# Patient Record
Sex: Female | Born: 1960 | Race: White | Hispanic: No | Smoking: Former smoker
Health system: Southern US, Community
[De-identification: ages and names within clinical notes are randomized; demographics above are authoritative.]

## PROBLEM LIST (undated history)

## (undated) DIAGNOSIS — E119 Type 2 diabetes mellitus without complications: Secondary | ICD-10-CM

## (undated) DIAGNOSIS — R1013 Epigastric pain: Secondary | ICD-10-CM

## (undated) DIAGNOSIS — F329 Major depressive disorder, single episode, unspecified: Secondary | ICD-10-CM

## (undated) DIAGNOSIS — J449 Chronic obstructive pulmonary disease, unspecified: Secondary | ICD-10-CM

## (undated) DIAGNOSIS — T7840XA Allergy, unspecified, initial encounter: Secondary | ICD-10-CM

## (undated) DIAGNOSIS — E538 Deficiency of other specified B group vitamins: Secondary | ICD-10-CM

## (undated) DIAGNOSIS — F319 Bipolar disorder, unspecified: Secondary | ICD-10-CM

## (undated) DIAGNOSIS — R51 Headache: Secondary | ICD-10-CM

## (undated) DIAGNOSIS — F419 Anxiety disorder, unspecified: Secondary | ICD-10-CM

## (undated) DIAGNOSIS — B49 Unspecified mycosis: Secondary | ICD-10-CM

## (undated) DIAGNOSIS — R079 Chest pain, unspecified: Secondary | ICD-10-CM

## (undated) DIAGNOSIS — R42 Dizziness and giddiness: Secondary | ICD-10-CM

## (undated) DIAGNOSIS — F32A Depression, unspecified: Secondary | ICD-10-CM

## (undated) DIAGNOSIS — G4730 Sleep apnea, unspecified: Secondary | ICD-10-CM

## (undated) DIAGNOSIS — H269 Unspecified cataract: Secondary | ICD-10-CM

## (undated) DIAGNOSIS — R519 Headache, unspecified: Secondary | ICD-10-CM

## (undated) DIAGNOSIS — Z87442 Personal history of urinary calculi: Secondary | ICD-10-CM

## (undated) DIAGNOSIS — M5412 Radiculopathy, cervical region: Secondary | ICD-10-CM

## (undated) DIAGNOSIS — E7800 Pure hypercholesterolemia, unspecified: Secondary | ICD-10-CM

## (undated) DIAGNOSIS — J45909 Unspecified asthma, uncomplicated: Secondary | ICD-10-CM

## (undated) DIAGNOSIS — J189 Pneumonia, unspecified organism: Secondary | ICD-10-CM

## (undated) DIAGNOSIS — R350 Frequency of micturition: Secondary | ICD-10-CM

## (undated) DIAGNOSIS — K219 Gastro-esophageal reflux disease without esophagitis: Secondary | ICD-10-CM

## (undated) DIAGNOSIS — IMO0001 Reserved for inherently not codable concepts without codable children: Secondary | ICD-10-CM

## (undated) DIAGNOSIS — G894 Chronic pain syndrome: Secondary | ICD-10-CM

## (undated) DIAGNOSIS — J439 Emphysema, unspecified: Secondary | ICD-10-CM

## (undated) DIAGNOSIS — G35 Multiple sclerosis: Secondary | ICD-10-CM

## (undated) DIAGNOSIS — M1990 Unspecified osteoarthritis, unspecified site: Secondary | ICD-10-CM

## (undated) DIAGNOSIS — N200 Calculus of kidney: Secondary | ICD-10-CM

## (undated) DIAGNOSIS — Q6589 Other specified congenital deformities of hip: Secondary | ICD-10-CM

## (undated) DIAGNOSIS — D509 Iron deficiency anemia, unspecified: Secondary | ICD-10-CM

## (undated) DIAGNOSIS — Z9889 Other specified postprocedural states: Secondary | ICD-10-CM

## (undated) DIAGNOSIS — L409 Psoriasis, unspecified: Secondary | ICD-10-CM

## (undated) DIAGNOSIS — M109 Gout, unspecified: Secondary | ICD-10-CM

## (undated) DIAGNOSIS — G35D Multiple sclerosis, unspecified: Secondary | ICD-10-CM

## (undated) DIAGNOSIS — M797 Fibromyalgia: Secondary | ICD-10-CM

## (undated) DIAGNOSIS — M5430 Sciatica, unspecified side: Secondary | ICD-10-CM

## (undated) DIAGNOSIS — R609 Edema, unspecified: Secondary | ICD-10-CM

## (undated) DIAGNOSIS — M5030 Other cervical disc degeneration, unspecified cervical region: Secondary | ICD-10-CM

## (undated) DIAGNOSIS — J302 Other seasonal allergic rhinitis: Secondary | ICD-10-CM

## (undated) DIAGNOSIS — R002 Palpitations: Secondary | ICD-10-CM

## (undated) DIAGNOSIS — Z8619 Personal history of other infectious and parasitic diseases: Secondary | ICD-10-CM

## (undated) DIAGNOSIS — R112 Nausea with vomiting, unspecified: Secondary | ICD-10-CM

## (undated) HISTORY — DX: Bipolar disorder, unspecified: F31.9

## (undated) HISTORY — DX: Deficiency of other specified B group vitamins: E53.8

## (undated) HISTORY — PX: LITHOTRIPSY: SUR834

## (undated) HISTORY — DX: Other seasonal allergic rhinitis: J30.2

## (undated) HISTORY — DX: Radiculopathy, cervical region: M54.12

## (undated) HISTORY — DX: Major depressive disorder, single episode, unspecified: F32.9

## (undated) HISTORY — DX: Multiple sclerosis, unspecified: G35.D

## (undated) HISTORY — DX: Anxiety disorder, unspecified: F41.9

## (undated) HISTORY — PX: OTHER SURGICAL HISTORY: SHX169

## (undated) HISTORY — DX: Unspecified cataract: H26.9

## (undated) HISTORY — DX: Depression, unspecified: F32.A

## (undated) HISTORY — DX: Emphysema, unspecified: J43.9

## (undated) HISTORY — DX: Multiple sclerosis: G35

## (undated) HISTORY — DX: Chest pain, unspecified: R07.9

## (undated) HISTORY — DX: Gastro-esophageal reflux disease without esophagitis: K21.9

## (undated) HISTORY — DX: Frequency of micturition: R35.0

## (undated) HISTORY — DX: Chronic pain syndrome: G89.4

## (undated) HISTORY — DX: Iron deficiency anemia, unspecified: D50.9

## (undated) HISTORY — DX: Unspecified asthma, uncomplicated: J45.909

## (undated) HISTORY — DX: Unspecified mycosis: B49

## (undated) HISTORY — DX: Calculus of kidney: N20.0

## (undated) HISTORY — DX: Personal history of other infectious and parasitic diseases: Z86.19

## (undated) HISTORY — DX: Epigastric pain: R10.13

## (undated) HISTORY — DX: Gout, unspecified: M10.9

## (undated) HISTORY — DX: Psoriasis, unspecified: L40.9

## (undated) HISTORY — DX: Other specified congenital deformities of hip: Q65.89

## (undated) HISTORY — DX: Fibromyalgia: M79.7

## (undated) HISTORY — DX: Allergy, unspecified, initial encounter: T78.40XA

## (undated) HISTORY — DX: Sciatica, unspecified side: M54.30

## (undated) SURGERY — Surgical Case
Anesthesia: *Unknown

---
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
## 2008-10-09 ENCOUNTER — Encounter: Payer: Self-pay | Admitting: Family Medicine

## 2009-04-25 ENCOUNTER — Encounter: Payer: Self-pay | Admitting: Family Medicine

## 2009-04-25 LAB — CONVERTED CEMR LAB: Pap Smear: NORMAL

## 2009-08-06 ENCOUNTER — Encounter: Payer: Self-pay | Admitting: Family Medicine

## 2010-04-29 ENCOUNTER — Encounter: Payer: Self-pay | Admitting: Family Medicine

## 2010-08-28 ENCOUNTER — Ambulatory Visit: Payer: Self-pay | Admitting: Family Medicine

## 2010-08-28 DIAGNOSIS — Z87442 Personal history of urinary calculi: Secondary | ICD-10-CM | POA: Insufficient documentation

## 2010-08-28 DIAGNOSIS — G35 Multiple sclerosis: Secondary | ICD-10-CM | POA: Insufficient documentation

## 2010-08-28 DIAGNOSIS — K219 Gastro-esophageal reflux disease without esophagitis: Secondary | ICD-10-CM | POA: Insufficient documentation

## 2010-08-28 DIAGNOSIS — F3189 Other bipolar disorder: Secondary | ICD-10-CM | POA: Insufficient documentation

## 2010-08-28 DIAGNOSIS — F329 Major depressive disorder, single episode, unspecified: Secondary | ICD-10-CM | POA: Insufficient documentation

## 2010-08-28 DIAGNOSIS — M1990 Unspecified osteoarthritis, unspecified site: Secondary | ICD-10-CM | POA: Insufficient documentation

## 2010-08-28 DIAGNOSIS — F3289 Other specified depressive episodes: Secondary | ICD-10-CM | POA: Insufficient documentation

## 2010-08-28 DIAGNOSIS — M169 Osteoarthritis of hip, unspecified: Secondary | ICD-10-CM | POA: Insufficient documentation

## 2010-09-04 ENCOUNTER — Telehealth: Payer: Self-pay | Admitting: Family Medicine

## 2010-09-04 ENCOUNTER — Encounter: Payer: Self-pay | Admitting: Family Medicine

## 2010-09-05 ENCOUNTER — Encounter: Payer: Self-pay | Admitting: Family Medicine

## 2010-09-08 ENCOUNTER — Encounter: Payer: Self-pay | Admitting: Family Medicine

## 2010-09-08 ENCOUNTER — Ambulatory Visit: Payer: Self-pay | Admitting: Internal Medicine

## 2010-09-08 LAB — CONVERTED CEMR LAB
Casts: 0 /lpf
Glucose, Urine, Semiquant: NEGATIVE
Ketones, urine, test strip: NEGATIVE
Specific Gravity, Urine: 1.015
pH: 7.5

## 2010-09-09 ENCOUNTER — Encounter: Payer: Self-pay | Admitting: Family Medicine

## 2010-09-09 ENCOUNTER — Telehealth: Payer: Self-pay | Admitting: Family Medicine

## 2010-09-16 ENCOUNTER — Encounter: Payer: Self-pay | Admitting: Family Medicine

## 2010-09-19 ENCOUNTER — Telehealth: Payer: Self-pay | Admitting: Family Medicine

## 2010-10-02 ENCOUNTER — Telehealth: Payer: Self-pay | Admitting: Family Medicine

## 2010-10-13 ENCOUNTER — Telehealth: Payer: Self-pay | Admitting: Family Medicine

## 2010-10-23 ENCOUNTER — Ambulatory Visit: Payer: Self-pay | Admitting: Family Medicine

## 2010-10-23 DIAGNOSIS — R21 Rash and other nonspecific skin eruption: Secondary | ICD-10-CM | POA: Insufficient documentation

## 2010-10-23 DIAGNOSIS — R55 Syncope and collapse: Secondary | ICD-10-CM | POA: Insufficient documentation

## 2010-10-24 ENCOUNTER — Telehealth: Payer: Self-pay | Admitting: Family Medicine

## 2010-10-28 ENCOUNTER — Telehealth: Payer: Self-pay | Admitting: Family Medicine

## 2010-11-05 ENCOUNTER — Telehealth: Payer: Self-pay | Admitting: Family Medicine

## 2010-11-12 ENCOUNTER — Encounter: Payer: Self-pay | Admitting: Family Medicine

## 2010-11-13 ENCOUNTER — Encounter: Payer: Self-pay | Admitting: Family Medicine

## 2010-11-13 ENCOUNTER — Ambulatory Visit
Admission: RE | Admit: 2010-11-13 | Discharge: 2010-11-13 | Payer: Self-pay | Source: Home / Self Care | Attending: Family Medicine | Admitting: Family Medicine

## 2010-11-13 DIAGNOSIS — N200 Calculus of kidney: Secondary | ICD-10-CM | POA: Insufficient documentation

## 2010-11-13 LAB — CONVERTED CEMR LAB
Bilirubin Urine: NEGATIVE
Glucose, Urine, Semiquant: NEGATIVE
Ketones, urine, test strip: NEGATIVE
Urobilinogen, UA: 0.2
pH: 6

## 2010-11-16 DIAGNOSIS — G4730 Sleep apnea, unspecified: Secondary | ICD-10-CM

## 2010-11-16 HISTORY — DX: Sleep apnea, unspecified: G47.30

## 2010-11-26 ENCOUNTER — Telehealth: Payer: Self-pay | Admitting: Family Medicine

## 2010-11-27 ENCOUNTER — Ambulatory Visit: Admit: 2010-11-27 | Payer: Self-pay | Admitting: Family Medicine

## 2010-11-27 ENCOUNTER — Ambulatory Visit
Admission: RE | Admit: 2010-11-27 | Discharge: 2010-11-27 | Payer: Self-pay | Source: Home / Self Care | Attending: Family Medicine | Admitting: Family Medicine

## 2010-12-02 ENCOUNTER — Telehealth: Payer: Self-pay | Admitting: Family Medicine

## 2010-12-04 ENCOUNTER — Ambulatory Visit
Admission: RE | Admit: 2010-12-04 | Discharge: 2010-12-04 | Payer: Self-pay | Source: Home / Self Care | Attending: Family Medicine | Admitting: Family Medicine

## 2010-12-09 ENCOUNTER — Telehealth: Payer: Self-pay | Admitting: Family Medicine

## 2010-12-11 ENCOUNTER — Encounter: Payer: Self-pay | Admitting: Family Medicine

## 2010-12-15 ENCOUNTER — Ambulatory Visit
Admission: RE | Admit: 2010-12-15 | Discharge: 2010-12-15 | Payer: Self-pay | Source: Home / Self Care | Attending: Family Medicine | Admitting: Family Medicine

## 2010-12-15 ENCOUNTER — Telehealth: Payer: Self-pay | Admitting: Family Medicine

## 2010-12-15 DIAGNOSIS — B029 Zoster without complications: Secondary | ICD-10-CM | POA: Insufficient documentation

## 2010-12-16 ENCOUNTER — Telehealth: Payer: Self-pay | Admitting: Family Medicine

## 2010-12-16 NOTE — Progress Notes (Signed)
Summary: Rx Hydrocodone/APAP  Phone Note Refill Request Call back at [phone] Message from:  Patient on October 13, 2010 11:23 AM  Refills Requested: Medication #1:  HYDROCODONE-ACETAMINOPHEN 10-325 MG TABS take one and a half tablets by mouth four times daily   Last Refilled: 09/19/2010 Patient request Rx refill.  Please advise.   Method Requested: Telephone to Pharmacy Initial call taken by: Linde Gillis CMA Duncan Dull),  October 13, 2010 11:24 AM  Follow-up for Phone Call        Rx called to pharmacy, left message on cell phone voicemail advising patient as instructed. Follow-up by: Linde Gillis CMA Duncan Dull),  October 13, 2010 12:02 PM    Prescriptions: HYDROCODONE-ACETAMINOPHEN 10-325 MG TABS (HYDROCODONE-ACETAMINOPHEN) take one and a half tablets by mouth four times daily  #360 x 0   Entered and Authorized by:   Ruthe Mannan MD   Signed by:   Ruthe Mannan MD on 10/13/2010   Method used:   Telephoned to ...       [facility]* (retail)       [address]       [place], [place]  [address]       Ph: [phone]       Fax: [phone]   RxID:   [id number]   Appended Document: Rx Hydrocodone/APAP Spoke with Baird Lyons at [facility] and was advised that patient still has 180 tablets left on the 09/19/2010 refill.  They will just keep this Rx on file for future refills.

## 2010-12-16 NOTE — Progress Notes (Signed)
Summary: please verify directions  Phone Note From Pharmacy   Caller: [facility] Summary of Call: Form from [facility] is on your desk.  They are asking for verification on directions. Initial call taken by: Lowella Petties CMA, AAMA,  October 24, 2010 3:13 PM  Follow-up for Phone Call        in my box. Ruthe Mannan MD  October 24, 2010 3:15 PM  Form faxed back to [facility] at [phone].  Follow-up by: Linde Gillis CMA Duncan Dull),  October 24, 2010 3:19 PM

## 2010-12-16 NOTE — Medication Information (Signed)
Summary: Approval for Lidoderm/[facility]  Approval for Lidoderm/[facility]   Imported By: Lanelle Bal 09/15/2010 08:23:32  _____________________________________________________________________  External Attachment:    Type:   Image     Comment:   External Document

## 2010-12-16 NOTE — Progress Notes (Signed)
Summary: Rx Hydrocodone/APAP  Phone Note Refill Request Message from:  Patient on September 19, 2010 2:43 PM  Refills Requested: Medication #1:  HYDROCODONE-ACETAMINOPHEN 10-325 MG TABS take one and a half tablets by mouth four times daily   Last Refilled: 08/28/2010 Patient is requesting a refill, please advise.   Method Requested: Telephone to Pharmacy Initial call taken by: Linde Gillis CMA Duncan Dull),  September 19, 2010 2:44 PM    Prescriptions: HYDROCODONE-ACETAMINOPHEN 10-325 MG TABS (HYDROCODONE-ACETAMINOPHEN) take one and a half tablets by mouth four times daily  #360 x 0   Entered and Authorized by:   Ruthe Mannan MD   Signed by:   Ruthe Mannan MD on 09/19/2010   Method used:   Print then Give to Patient   RxID:   [id number]   Appended Document: Rx Hydrocodone/APAP Rx called to [facility], patient notified.

## 2010-12-16 NOTE — Assessment & Plan Note (Signed)
Summary: FAINTED AT WORK LAST WEEK/NT   Vital Signs:  Patient profile:   50 year old female Height:      69.75 inches Weight:      210.50 pounds BMI:     30.53 Temp:     99.2 degrees F oral Pulse rate:   76 / minute Pulse rhythm:   regular BP sitting:   140 / 80  (left arm) Cuff size:   regular  Vitals Entered By: Linde Gillis CMA Duncan Dull) (October 23, 2010 3:44 PM) CC: fainted at work a week ago   History of Present Illness: 50 yo with complicated medical history, including bipolar disorder, progressive MS here for passing out at work.    Is given a limited time for restroom breaks.  Was rushing to get back to her desk, felt light headed and passed out.  Did not hit her head.  Per witnesses, woke up immediately.  They called 911, EMS checked CBG and BP. CBG was 77 and BP was 117/70.  No CP, SOB or palpitations.  NO recurrent episodes.  Still has rash on foot and hip.  Topical steroids did not help.  Lesion on foot is larger.  Itches at times.  Current Medications (verified): 1)  Cymbalta 60 Mg Cpep (Duloxetine Hcl) .... Take One Tablet By Mouth Two Times A Day 2)  Lamotrigine 150 Mg Tabs (Lamotrigine) .... Take Two Tablets By Mouth Daily 3)  Hydrocodone-Acetaminophen 10-325 Mg Tabs (Hydrocodone-Acetaminophen) .... Take One and A Half Tablets By Mouth Four Times Daily 4)  Hydrochlorothiazide 25 Mg Tabs (Hydrochlorothiazide) .... Take One Tablet By Mouth Daily 5)  Temazepam 30 Mg Caps (Temazepam) .... Take One Tablet By Mouth Two Times A Day 6)  Hydroxyzine Hcl 25 Mg Tabs (Hydroxyzine Hcl) .Marland Kitchen.. 1 Tab By Mouth Daily. 7)  Lidoderm 5 % Ptch (Lidocaine) .... Apply Two Patches On Skin Daily. 8)  Voltaren 1 %  Gel (Diclofenac Sodium) .... Two Times A Day  To Qid As Needed 9)  Fluocinolone Acetonide 0.01 % Crea (Fluocinolone Acetonide) .... Apply To Area Two Times A Day As Needed 10)  Xopenex Hfa 45 Mcg/act Aero (Levalbuterol Tartrate) 11)  Ciprofloxacin Hcl 500 Mg Tabs  (Ciprofloxacin Hcl) .... Take One By Mouth Two Times A Day X 7 Days  Allergies: 1)  ! Ceclor 2)  ! Voltaren 3)  ! Motrin 4)  ! Advil 5)  ! * Aleve 6)  ! * Abilify  Past History:  Past Medical History: Last updated: 09/08/2010 bipolar disorder multiple sclerosis Depression Osteoarthritis GERD Nephrolithiasis, hx of  Past Surgical History: Last updated: 09-Sep-2010 Tonsillectomy  Family History: Last updated: 2010/09/09 Dad died at 40 of sarcoma Mom- healthy  Social History: Last updated: Sep 09, 2010 recently moved here from [place] to help take care of her mother. lives with mother and sister. has 49 yo son in [place]. Current Smoker Alcohol use-no Drug use-no  Risk Factors: Smoking Status: current (09/09/10)  Review of Systems      See HPI CV:  Complains of fainting and lightheadness. MS:  Denies muscle weakness. Derm:  Denies rash. Neuro:  Denies headaches and numbness.  Physical Exam  General:  alert, very talkative, seems to have diffiiculty focusing on one topic. Mouth:  good dentition.   Neck:  No deformities, masses, or tenderness noted. no carotid bruits Lungs:  Normal respiratory effort, chest expands symmetrically. Lungs are clear to auscultation, no crackles or wheezes. Heart:  Normal rate and regular rhythm. S1 and S2 normal  without gallop, murmur, click, rub or other extra sounds. Abdomen:  Bowel sounds positive,abdomen soft and non-tender without masses, organomegaly or hernias noted. Extremities:  no edema Skin:  large circular raised erythematous plaque on right foot and left hip.   Impression & Recommendations:  Problem # 1:  SYNCOPE (ICD-780.2) Assessment New Resolved. Does not want a work up, likely vasovagal due to multiple medications, stressors and dehydration.    Problem # 2:  SKIN RASH (ICD-782.1) Assessment: New  Refer to derm.  ? psorasis, discoid autoimmune. Her updated medication list for this problem includes:    Fluocinolone  Acetonide 0.01 % Crea (Fluocinolone acetonide) .Marland Kitchen... Apply to area two times a day as needed  Orders: Dermatology Referral (Derma)  Her updated medication list for this problem includes:    Fluocinolone Acetonide 0.01 % Crea (Fluocinolone acetonide) .Marland Kitchen... Apply to area two times a day as needed  Complete Medication List: 1)  Cymbalta 60 Mg Cpep (Duloxetine hcl) .... Take one tablet by mouth two times a day 2)  Lamotrigine 150 Mg Tabs (Lamotrigine) .... Take two tablets by mouth daily 3)  Hydrocodone-acetaminophen 10-325 Mg Tabs (Hydrocodone-acetaminophen) .... Take one and a half tablets by mouth four times daily 4)  Hydrochlorothiazide 25 Mg Tabs (Hydrochlorothiazide) .... Take one tablet by mouth daily 5)  Temazepam 30 Mg Caps (Temazepam) .... Take one tablet by mouth two times a day 6)  Hydroxyzine Hcl 25 Mg Tabs (Hydroxyzine hcl) .Marland Kitchen.. 1 tab by mouth daily. 7)  Lidoderm 5 % Ptch (Lidocaine) .... Apply two patches on skin daily. 8)  Voltaren 1 % Gel (Diclofenac sodium) .... Two times a day  to qid as needed 9)  Fluocinolone Acetonide 0.01 % Crea (Fluocinolone acetonide) .... Apply to area two times a day as needed 10)  Xopenex Hfa 45 Mcg/act Aero (Levalbuterol tartrate) 11)  Ciprofloxacin Hcl 500 Mg Tabs (Ciprofloxacin hcl) .... Take one by mouth two times a day x 7 days  Patient Instructions: 1)  Please stop by to see Shirlee Limerick on your way out.   Orders Added: 1)  Est. Patient Level IV [04540] 2)  Dermatology Referral [Derma]    Current Allergies (reviewed today): ! CECLOR ! VOLTAREN ! MOTRIN ! ADVIL ! * ALEVE ! * ABILIFY

## 2010-12-16 NOTE — Progress Notes (Signed)
Summary: refill request for lidoderm  Phone Note Refill Request Message from:  Fax from Pharmacy  Refills Requested: Medication #1:  LIDODERM 5 % PTCH apply two patches on skin daily.   Last Refilled: 09/05/2010 Faxed request from [facility], [phone].   Initial call taken by: Lowella Petties CMA, AAMA,  October 02, 2010 11:33 AM  Follow-up for Phone Call        Rx called to [facility]. Follow-up by: Linde Gillis CMA Duncan Dull),  October 02, 2010 11:58 AM    Prescriptions: LIDODERM 5 % PTCH (LIDOCAINE) apply two patches on skin daily.  #3 boxes x 0   Entered and Authorized by:   Ruthe Mannan MD   Signed by:   Ruthe Mannan MD on 10/02/2010   Method used:   Telephoned to ...       [facility] (mail-order)             , [place]         Ph: [phone]       Fax:    RxID:   [id number]

## 2010-12-16 NOTE — Letter (Signed)
Summary: Out of Work  [employer] at [facility]  [address] [place], [place] [address]   Phone: [phone]  Fax: [phone]    September 09, 2010   Employee:  Nigel Berthold    To Whom It May Concern:   For Medical reasons, Ms. Whittlesey needs Voltaren Gel for pain control.  She has tried multiple NSAIDs, Ibuprofen, Tylenol, Meloxicam along with several narcotics, most recently Norco and Vicodin.    We should be encouraging our patients to use medications that do not cause sedation and addiction.   If you need additional information, please feel free to contact our office.         Sincerely,    Ruthe Mannan MD

## 2010-12-16 NOTE — Letter (Signed)
Summary: Controlled Substances Contract  [place] at [facility]  [address] [place], [place] [address]   Phone: [phone]  Fax: [phone]    [facility] Controlled Substances Contract         Patient Name: Emily Moon Patient DOB: 1961/07/26        Patient MRN:  [id number]        Physician's Name: _________________________________________   Patients must complete this contract before doctors at the [facility] office will be willing to prescribe controlled substances. I understand that: ___1)  I am responsible for my controlled substance medications.  If my prescription is lost, misplaced or stolen, or if I take more than prescribed, my doctor will not write me a new prescription. ___2)  I will not request or accept controlled substances or controlled substance prescriptions from any other doctor or clinic while I am receiving controlled substance treatment at [facility].  The ONLY exception is if controlled substances are prescribed for the treatment of an acute condition that is NOT the diagnosis for which I am receiving treatment at [facility].   I will call my physician at [facility] if I receive controlled substance or controlled substance prescriptions from anywhere else. ___3)  Controlled substance refills will be made ONLY during regular office hours. ___4)  Refills will not be made if I run out early.  Refills will not be made during work-in or urgent care visits.  Refills will NOT be made for "emergencies", such as on a Friday afternoon or by on call service at night or weekends.  I understand that I am required to call at least 2 business days prior to expiration date for controlled substance and/or needing controlled substance refills.   ___5)  I will not use illicit (illegal) drugs.  ___6)  I agree to take urine or blood drug tests when requested for routine screening. ___7)  I agree to use only ONE pharmacy for  filling ALL my controlled substance prescriptions.             Name and Location of Pharmacy:                                                                                                                  ___8)  I understand that my doctor may review my use of controlled substances using the [facility] Controlled Substance Reporting System. ___9)  If I behave in an abusive way towards [facility] staff, my controlled substance prescriptions may be stopped, and I may be dismissed from this practice. ___10)  I understand that if I break any of the above terms of this contract, my pain prescription and/or treatment may be stopped immediately.  If I get controlled substances from someone else or use illegal drugs, I may be reported to all my doctors, medical facilities and appropriate authorities.  I have been fully informed by [facility] physicians and the staff regarding psychological dependence (addiction)  to controlled substances.  I understand that I should stop my medication ONLY under medical supervision or I may have withdrawal symptoms.  ***I have read this contract and it has been explained to me by [facility] physicians and/or their staff, and I fully understand the consequences of violating any of the terms of this contract.    Patient Signature _________________________________________ Date August 28, 2010   [facility] Staff Signature ____________________________________ Date August 28, 2010

## 2010-12-16 NOTE — Assessment & Plan Note (Signed)
Summary: ? UTI   Vital Signs:  Patient profile:   50 year old female Weight:      213.25 pounds Temp:     99.5 degrees F oral Pulse rate:   74 / minute Pulse rhythm:   regular BP sitting:   124 / 74  (left arm) Cuff size:   large  Vitals Entered By: Selena Batten Dance CMA Duncan Dull) (September 08, 2010 3:47 PM)   History of Present Illness: CC: ?UTI  This am with polyuria and pressure.  Starting to have pressure as well.  + urgency.  low grade fever (in clinic to 99.5).  + itching.  No vag discharge.  on period.  takes cranberry pill every night.  tries not to drink too much soda to prevent kidney stones.  h/o MS, h/o kidney stones 2008 s/p hospitalization and stent with septic shock.  No flank pain.  Mild fever.  No abd pain other than pressure.  No n/v.  no recent abx use.  says has had stress incontinence in past (with sneezing/laughing), has had 1 child, vag.  previuosly on anticholinergic but unable to tolerate side effects.  Hasn't had bladder drop in past.  Current Medications (verified): 1)  Cymbalta 60 Mg Cpep (Duloxetine Hcl) .... Take One Tablet By Mouth Two Times A Day 2)  Lamotrigine 150 Mg Tabs (Lamotrigine) .... Take Two Tablets By Mouth Daily 3)  Hydrocodone-Acetaminophen 10-325 Mg Tabs (Hydrocodone-Acetaminophen) .... Take One and A Half Tablets By Mouth Four Times Daily 4)  Hydrochlorothiazide 25 Mg Tabs (Hydrochlorothiazide) .... Take One Tablet By Mouth Daily 5)  Temazepam 30 Mg Caps (Temazepam) .... Take One Tablet By Mouth Two Times A Day 6)  Hydroxyzine Hcl 25 Mg Tabs (Hydroxyzine Hcl) .Marland Kitchen.. 1 Tab By Mouth Daily. 7)  Lidoderm 5 % Ptch (Lidocaine) .... Apply Two Patches On Skin Daily. 8)  Voltaren 1 %  Gel (Diclofenac Sodium) .... Two Times A Day  To Qid As Needed 9)  Fluocinolone Acetonide 0.01 % Crea (Fluocinolone Acetonide) .... Apply To Area Two Times A Day As Needed 10)  Xopenex Hfa 45 Mcg/act Aero (Levalbuterol Tartrate)  Allergies: 1)  ! Ceclor 2)  ! Voltaren 3)   ! Motrin 4)  ! Advil 5)  ! * Aleve 6)  ! * Abilify  Past History:  Social History: Last updated: 08/28/2010 recently moved here from [place] to help take care of her mother. lives with mother and sister. has 22 yo son in [place]. Current Smoker Alcohol use-no Drug use-no  Past Medical History: bipolar disorder multiple sclerosis Depression Osteoarthritis GERD Nephrolithiasis, hx of  Review of Systems       per HPI  Physical Exam  General:  alert, very talkative, seems to have diffiiculty focusing on one topic. Lungs:  Normal respiratory effort, chest expands symmetrically. Lungs are clear to auscultation, no crackles or wheezes. Heart:  Normal rate and regular rhythm. S1 and S2 normal without gallop, murmur, click, rub or other extra sounds. Abdomen:  Bowel sounds positive,abdomen soft and non-tender without masses, organomegaly or hernias noted.  no CVA tenderness.  slight suprapubic pressure Pulses:  2+ rad pulses Extremities:  no edema   Impression & Recommendations:  Problem # 1:  ? of UTI (ICD-599.0)  complicated urological history (kidney stones, lithotripsy, stents, etc).  could be UTI (given sxs) however UA seemed contaminated with epis today and not many whites.  recollected specimen, sent for culture.  started cipro bid.  per patient bactrim doesn't work, wanted full  7 day course of cipro.  advised I would start it but if UCx with no growth, we will call and stop abx.  pt agrees with plan.  provided with note for work for being seen today.  advised if no infection, will need to return to eval as to why having those sxs, likely GYN exam and assess for bladder prolapse/pelvic wall relaxation.  h/o unspecified incontinence per patient.  push fluids for now.  Her updated medication list for this problem includes:    Ciprofloxacin Hcl 500 Mg Tabs (Ciprofloxacin hcl) .Marland Kitchen... Take one by mouth two times a day x 7 days  Orders: UA Dipstick W/ Micro (manual) ([address]) Specimen  Handling ([address]) T-Culture, Urine ([id number])  Complete Medication List: 1)  Cymbalta 60 Mg Cpep (Duloxetine hcl) .... Take one tablet by mouth two times a day 2)  Lamotrigine 150 Mg Tabs (Lamotrigine) .... Take two tablets by mouth daily 3)  Hydrocodone-acetaminophen 10-325 Mg Tabs (Hydrocodone-acetaminophen) .... Take one and a half tablets by mouth four times daily 4)  Hydrochlorothiazide 25 Mg Tabs (Hydrochlorothiazide) .... Take one tablet by mouth daily 5)  Temazepam 30 Mg Caps (Temazepam) .... Take one tablet by mouth two times a day 6)  Hydroxyzine Hcl 25 Mg Tabs (Hydroxyzine hcl) .Marland Kitchen.. 1 tab by mouth daily. 7)  Lidoderm 5 % Ptch (Lidocaine) .... Apply two patches on skin daily. 8)  Voltaren 1 % Gel (Diclofenac sodium) .... Two times a day  to qid as needed 9)  Fluocinolone Acetonide 0.01 % Crea (Fluocinolone acetonide) .... Apply to area two times a day as needed 10)  Xopenex Hfa 45 Mcg/act Aero (Levalbuterol tartrate) 11)  Ciprofloxacin Hcl 500 Mg Tabs (Ciprofloxacin hcl) .... Take one by mouth two times a day x 7 days  Patient Instructions: 1)  You could have UTI.  We have sent for culture and will call you later this week with results. 2)  if infection, complete 7 day course of cipro.  If not infection, we will call you and have you stop antibiotics, then we will need to figure out why you are having these symptoms. 3)  Good to meet you today, call clinic with questions. 4)  You are doing all the right things - push fluids, cranberry juice or pills. Prescriptions: CIPROFLOXACIN HCL 500 MG TABS (CIPROFLOXACIN HCL) take one by mouth two times a day x 7 days  #14 x 0   Entered and Authorized by:   Eustaquio Boyden  MD   Signed by:   Eustaquio Boyden  MD on 09/08/2010   Method used:   Print then Give to Patient   RxID:   [id number]    Orders Added: 1)  Est. Patient Level II [78469] 2)  UA Dipstick W/ Micro (manual) [81000] 3)  Specimen Handling [99000] 4)  T-Culture,  Urine [[id number]]    Current Allergies (reviewed today): ! CECLOR ! VOLTAREN ! MOTRIN ! ADVIL ! * ALEVE ! * ABILIFY   Laboratory Results   Urine Tests  Date/Time Received: September 08, 2010 3:48 PM  Date/Time Reported: September 08, 2010 3:48 PM   Routine Urinalysis   Color: yellow Appearance: Hazy Glucose: negative   (Normal Range: Negative) Bilirubin: negative   (Normal Range: Negative) Ketone: negative   (Normal Range: Negative) Spec. Gravity: 1.015   (Normal Range: 1.003-1.035) Blood: moderate   (Normal Range: Negative) pH: 7.5   (Normal Range: 5.0-8.0) Protein: trace   (Normal Range: Negative) Urobilinogen: 0.2   (Normal Range: 0-1) Nitrite: negative   (  Normal Range: Negative) Leukocyte Esterace: trace   (Normal Range: Negative)  Urine Microscopic WBC/HPF: rare  RBC/HPF: rare Bacteria/HPF: 1+ Mucous/HPF: 0 Epithelial/HPF: 1-5 Crystals/HPF: 0 Casts/LPF: 0 Yeast/HPF: 0 Other: 0    Comments: read by .....................Eustaquio Boyden  MD  September 08, 2010 5:29 PM  recollected specimen 2/2 concern for contamination of above (more epis than whites).  Sent UCx.

## 2010-12-16 NOTE — Progress Notes (Signed)
Summary: Spot on right foot getting bigger  Phone Note Call from Patient Call back at [phone]   Caller: Patient Call For: Ruthe Mannan MD Summary of Call: Patient says that the spot on her right foot that she spoke with you about has gotten bigger.  The ointment she was given is not working.  What should she do?  Please advise.  Initial call taken by: Linde Gillis CMA Duncan Dull),  September 09, 2010 11:53 AM  Follow-up for Phone Call        we can refer to dermatology. Ruthe Mannan MD  September 09, 2010 12:45 PM  Left message on cell phone voicemail advising patient as instructed.  Advised her to call back and let us know if she would like the referral. Follow-up by: Linde Gillis CMA Duncan Dull),  September 09, 2010 1:33 PM

## 2010-12-16 NOTE — Letter (Signed)
Summary: Records Dated 04-22-10 thru 07-11-10/[facility]  Records Dated 04-22-10 thru 07-11-10/[facility]   Imported By: Lanelle Bal 09/15/2010 12:26:02  _____________________________________________________________________  External Attachment:    Type:   Image     Comment:   External Document

## 2010-12-16 NOTE — Assessment & Plan Note (Signed)
Summary: NEW PT TO EST/CLE   Vital Signs:  Patient profile:   50 year old female Height:      69.75 inches Weight:      211 pounds BMI:     30.60 Temp:     99.5 degrees F oral Pulse rate:   100 / minute Pulse rhythm:   regular BP sitting:   110 / 70  (left arm) Cuff size:   regular  Vitals Entered By: Linde Gillis CMA Duncan Dull) (09-13-10 10:02 AM) CC: new patient, establish care   History of Present Illness: 50 yo here to establish care.  1.  Bipolar disorder- was followed by Dr. Rachel Moulds in [place].  Has been on Lamotrigine 300 mg daily for years.  Feels it is working well.  no recent manic or depressive episodes.  2. Depression- feels she stable.  On Cymbalta 60 mg two times a day, Temazepam 30 mg two times a day.  3.  MS- diagnosed 20 years ago, she feels it has progressed greatly in last several years.  Has some lesions in her frontal lobe.  Lately has more muscluar pain and word finding issues.  Takes Hydrocodone/APAP 10-325 1.5 tab qid for years. Also uses Voltaren gel and Lidoderm patch.  Feels this is the only combination of medication that helps.  Does not think she needs to find a neurologist here yet as she cannot tolerate MS drugs.  4.  OA- states that she has OA? in her fingers and hands (awaiting records).    Preventive Screening-Counseling & Management  Alcohol-Tobacco     Smoking Status: current      Drug Use:  no.    Current Medications (verified): 1)  Cymbalta 60 Mg Cpep (Duloxetine Hcl) .... Take One Tablet By Mouth Two Times A Day 2)  Lamotrigine 150 Mg Tabs (Lamotrigine) .... Take Two Tablets By Mouth Daily 3)  Hydrocodone-Acetaminophen 10-325 Mg Tabs (Hydrocodone-Acetaminophen) .... Take One and A Half Tablets By Mouth Four Times Daily 4)  Hydrochlorothiazide 25 Mg Tabs (Hydrochlorothiazide) .... Take One Tablet By Mouth Daily 5)  Temazepam 30 Mg Caps (Temazepam) .... Take One Tablet By Mouth Two Times A Day 6)  Hydroxyzine Hcl 25 Mg Tabs (Hydroxyzine  Hcl) .Marland Kitchen.. 1 Tab By Mouth Daily. 7)  Lidoderm 5 % Ptch (Lidocaine) .... Apply Two Patches On Skin Daily. 8)  Voltaren 1 %  Gel (Diclofenac Sodium) .... Two Times A Day  To Qid As Needed 9)  Fluocinolone Acetonide 0.01 % Crea (Fluocinolone Acetonide) .... Apply To Area Two Times A Day As Needed 10)  Xopenex Hfa 45 Mcg/act Aero (Levalbuterol Tartrate)  Allergies (verified): 1)  ! Ceclor 2)  ! Voltaren 3)  ! Motrin 4)  ! Advil 5)  ! * Aleve 6)  ! * Abilify  Past History:  Family History: Last updated: Sep 13, 2010 Dad died at 72 of sarcoma Mom- healthy  Social History: Last updated: 2010/09/13 recently moved here from [place] to help take care of her mother. lives with mother and sister. has 57 yo son in [place]. Current Smoker Alcohol use-no Drug use-no  Risk Factors: Smoking Status: current (09-13-2010)  Past Medical History: bipolar siorder multiple sclerosis Depression Osteoarthritis GERD Nephrolithiasis, hx of  Past Surgical History: Tonsillectomy  Family History: Dad died at 10 of sarcoma Mom- healthy  Social History: recently moved here from [place] to help take care of her mother. lives with mother and sister. has 52 yo son in [place]. Current Smoker Alcohol use-no Drug use-no  Smoking Status:  current Drug Use:  no  Review of Systems      See HPI General:  Complains of fatigue. Eyes:  Denies blurring. ENT:  Denies difficulty swallowing. CV:  Denies chest pain or discomfort. Resp:  Denies shortness of breath. GI:  Denies abdominal pain. GU:  Denies abnormal vaginal bleeding. MS:  Complains of joint pain, joint swelling, muscle aches, muscle weakness, and stiffness; denies joint redness. Derm:  Denies poor wound healing. Neuro:  Denies headaches. Psych:  Complains of anxiety, depression, irritability, and mental problems; denies suicidal thoughts/plans, thoughts of violence, unusual visions or sounds, and thoughts /plans of harming others. Endo:  Denies cold  intolerance and heat intolerance. Heme:  Denies abnormal bruising and bleeding.  Physical Exam  General:  alert, very talkative, seems to have difficiulty focusing on one topic. Head:  normocephalic and atraumatic.   Eyes:  vision grossly intact, pupils equal, pupils round, and pupils reactive to light.   Ears:  R ear normal and L ear normal.   Nose:  no external deformity.   Mouth:  good dentition.   Neck:  No deformities, masses, or tenderness noted. Lungs:  Normal respiratory effort, chest expands symmetrically. Lungs are clear to auscultation, no crackles or wheezes. Heart:  Normal rate and regular rhythm. S1 and S2 normal without gallop, murmur, click, rub or other extra sounds. Abdomen:  Bowel sounds positive,abdomen soft and non-tender without masses, organomegaly or hernias noted. Msk:  normal ROM and no joint tenderness.   Extremities:  no edema Neurologic:  alert & oriented X3 and gait normal.   Skin:  Intact without suspicious lesions or rashes Psych:  Cognition and judgment appear intact. Alert and cooperative with normal attention span and concentration. No apparent delusions, illusions, hallucinations   Impression & Recommendations:  Problem # 1:  MULTIPLE SCLEROSIS, PROGRESSIVE/RELAPSING (ICD-340) Assessment Deteriorated Per pt, deteriorating.  She does not want neurology referral at this time.  Will await records.  Problem # 2:  OTHER AND UNSPECIFIED BIPOLAR DISORDERS (ICD-296.89) Assessment: Unchanged Under more stress now that she is away from home.   Will refer to psych so she can establish care with someone locally. Continue current meds. Orders: Psychiatric Referral (Psych)  Problem # 3:  DEPRESSION (ICD-311) Assessment: Unchanged Continue current meds.  Await records, psych referral pending. Her updated medication list for this problem includes:    Cymbalta 60 Mg Cpep (Duloxetine hcl) .Marland Kitchen... Take one tablet by mouth two times a day    Hydroxyzine Hcl 25  Mg Tabs (Hydroxyzine hcl) .Marland Kitchen... 1 tab by mouth daily.  Problem # 4:  OSTEOARTHRITIS (ICD-715.90) Assessment: Unchanged Signed a controlled substances contract today.  Agreed to write prescription for one month but no refills until we receive her old records.  Pt agreed with plan. Her updated medication list for this problem includes:    Hydrocodone-acetaminophen 10-325 Mg Tabs (Hydrocodone-acetaminophen) .Marland Kitchen... Take one and a half tablets by mouth four times daily  Complete Medication List: 1)  Cymbalta 60 Mg Cpep (Duloxetine hcl) .... Take one tablet by mouth two times a day 2)  Lamotrigine 150 Mg Tabs (Lamotrigine) .... Take two tablets by mouth daily 3)  Hydrocodone-acetaminophen 10-325 Mg Tabs (Hydrocodone-acetaminophen) .... Take one and a half tablets by mouth four times daily 4)  Hydrochlorothiazide 25 Mg Tabs (Hydrochlorothiazide) .... Take one tablet by mouth daily 5)  Temazepam 30 Mg Caps (Temazepam) .... Take one tablet by mouth two times a day 6)  Hydroxyzine Hcl 25 Mg Tabs (  Hydroxyzine hcl) .Marland Kitchen.. 1 tab by mouth daily. 7)  Lidoderm 5 % Ptch (Lidocaine) .... Apply two patches on skin daily. 8)  Voltaren 1 % Gel (Diclofenac sodium) .... Two times a day  to qid as needed 9)  Fluocinolone Acetonide 0.01 % Crea (Fluocinolone acetonide) .... Apply to area two times a day as needed 10)  Xopenex Hfa 45 Mcg/act Aero (Levalbuterol tartrate)  Other Orders: Radiology Referral (Radiology)  Patient Instructions: 1)  Please stop by to see Shirlee Limerick on your way out. Prescriptions: FLUOCINOLONE ACETONIDE 0.01 % CREA (FLUOCINOLONE ACETONIDE) Apply to area two times a day as needed  #30 g x 0   Entered and Authorized by:   Ruthe Mannan MD   Signed by:   Ruthe Mannan MD on 08/28/2010   Method used:   Print then Give to Patient   RxID:   [id number] VOLTAREN 1 %  GEL (DICLOFENAC SODIUM) two times a day  to qid as needed  #1 x 3   Entered and Authorized by:   Ruthe Mannan MD   Signed by:   Ruthe Mannan MD on 08/28/2010   Method used:   Print then Give to Patient   RxID:   [id number] LIDODERM 5 % PTCH (LIDOCAINE) apply two patches on skin daily.  #3 boxes x 0   Entered and Authorized by:   Ruthe Mannan MD   Signed by:   Ruthe Mannan MD on 08/28/2010   Method used:   Print then Give to Patient   RxID:   [id number] TEMAZEPAM 30 MG CAPS (TEMAZEPAM) take one tablet by mouth two times a day  #60 x 0   Entered and Authorized by:   Ruthe Mannan MD   Signed by:   Ruthe Mannan MD on 08/28/2010   Method used:   Print then Give to Patient   RxID:   [id number] CYMBALTA 60 MG CPEP (DULOXETINE HCL) take one tablet by mouth two times a day  #60 x 6   Entered and Authorized by:   Ruthe Mannan MD   Signed by:   Ruthe Mannan MD on 08/28/2010   Method used:   Print then Give to Patient   RxID:   [id number] LAMOTRIGINE 150 MG TABS (LAMOTRIGINE) take two tablets by mouth daily  #60 x 6   Entered and Authorized by:   Ruthe Mannan MD   Signed by:   Ruthe Mannan MD on 08/28/2010   Method used:   Print then Give to Patient   RxID:   [id number] HYDROCODONE-ACETAMINOPHEN 10-325 MG TABS (HYDROCODONE-ACETAMINOPHEN) take one and a half tablets by mouth four times daily  #360 x 0   Entered and Authorized by:   Ruthe Mannan MD   Signed by:   Ruthe Mannan MD on 08/28/2010   Method used:   Print then Give to Patient   RxID:   [id number] HYDROXYZINE HCL 25 MG TABS (HYDROXYZINE HCL) 1 tab by mouth daily.  #30 x 0   Entered and Authorized by:   Ruthe Mannan MD   Signed by:   Ruthe Mannan MD on 08/28/2010   Method used:   Print then Give to Patient   RxID:   [id number]   Current Allergies (reviewed today): ! CECLOR ! VOLTAREN ! MOTRIN ! ADVIL ! * ALEVE ! * ABILIFY

## 2010-12-16 NOTE — Miscellaneous (Signed)
Summary: Controlled Substance Agreement  Controlled Substance Agreement   Imported By: Lanelle Bal 09/04/2010 11:30:12  _____________________________________________________________________  External Attachment:    Type:   Image     Comment:   External Document

## 2010-12-16 NOTE — Letter (Signed)
Summary: Out of Work  [employer] at [facility]  [address] [place], [place] [address]   Phone: [phone]  Fax: [phone]    September 08, 2010   Employee:  Nigel Berthold    To Whom It May Concern:   For Medical reasons, please excuse the above named employee from work for the following dates:  Start:  September 08, 2010   End:  September 08, 2010   If you need additional information, please feel free to contact our office.         Sincerely,    Eustaquio Boyden  MD

## 2010-12-16 NOTE — Progress Notes (Signed)
Summary: Prior Authorization Voltaren Gel & Lidoderm patch   Phone Note Outgoing Call   Call placed by: Linde Gillis CMA Duncan Dull),  September 04, 2010 12:17 PM Call placed to: [facility] [phone] Summary of Call: Patient states that she needs PA on Lidoderm patch and Voltaren gel.  I called [facility] and spoke with Len to obtain PA forms for both medications.  Both forms were faxed to me today at 12:19.  I will fill out what I can and then put both forms in Dr. Elmer Sow IN box so she can complete the rest.  Linde Gillis CMA Duncan Dull)  September 04, 2010 12:20 PM    Forms given to Dr. Dayton Martes for completion.  Initial call taken by: Linde Gillis CMA Duncan Dull),  September 04, 2010 3:49 PM  Follow-up for Phone Call        In my box. Ruthe Mannan MD  September 04, 2010 3:52 PM  Forms faxed to [facility] PA at [phone].  Follow-up by: Linde Gillis CMA Duncan Dull),  September 04, 2010 4:55 PM     Appended Document: Prior Authorization Voltaren Gel & Lidoderm patch  PA for Lidoderm patches have been approved.  Effective 09/05/2010 Expiration 09/06/2011.  Advised patients sister and also left a message on cell phone voicemail advising patient as instructed.  [facility] notified via fax.  Appended Document: Prior Authorization Voltaren Gel & Lidoderm patch  Received a denial for Voltaren Gel.  Additional information was included with original fax and faxed back to [facility], awaiting reply.  Appended Document: Prior Authorization Voltaren Gel & Lidoderm patch  Denial was still upheld by [employer] [place].  We can request an appeal, form in your IN box.  Appended Document: Prior Authorization Voltaren Gel & Lidoderm patch  Called [employer] [place] to request an appeal for Voltaren Gel.  The rep I spoke with said that Dr. Dayton Martes has to send in a written letter via fax or mail stating why Voltaren Gel is the necessary treatment for this patient, what other medications the patient has tried and failed.  We can  either fax the appeal  letter to [phone] or mail it to [employer] Mail Route [id number] [place], [place] [address].  This process could take up to 30 days.  Appended Document: Prior Authorization Voltaren Gel & Lidoderm patch  in my box.  Appended Document: Prior Authorization Voltaren Gel & Lidoderm patch  Letter faxed to [phone], [employer] [place] attention appeals dept.  Appended Document: Prior Authorization Voltaren Gel & Lidoderm patch  Received approval for Voltaren Gel.  Effective 09/16/2010 Expiration 09/16/2011.  Patient notified and approval letter faxed to pharmacy.

## 2010-12-16 NOTE — Letter (Signed)
Summary: Out of Work  [employer] at [facility] [place]  [address] [place], [place] [address]   Phone: [phone]  Fax: [phone]    October 23, 2010   Employee:  MECHELE KITTLESON    To Whom It May Concern:  For medical reasons, please allow above named employee to take more frequent breaks as needed to the use the restroom.    If you need additional information, please feel free to contact our office.         Sincerely,    Ruthe Mannan MD

## 2010-12-17 ENCOUNTER — Telehealth: Payer: Self-pay | Admitting: Family Medicine

## 2010-12-18 ENCOUNTER — Ambulatory Visit: Payer: Self-pay | Admitting: Family Medicine

## 2010-12-18 NOTE — Assessment & Plan Note (Signed)
Summary: pain from passing kidney stones/nt   Vital Signs:  Patient profile:   50 year old female Height:      69.75 inches Weight:      213.50 pounds BMI:     30.97 Temp:     99.1 degrees F oral Pulse rate:   88 / minute Pulse rhythm:   regular BP sitting:   140 / 80  (left arm) Cuff size:   regular  Vitals Entered By: Linde Gillis CMA Duncan Dull) (November 27, 2010 2:01 PM) CC: pain after passing kidney stone   History of Present Illness: 50 yo with complicated medical history, including bipolar disorder, progressive MS here for increased pain.  Has been passing kidney stones.    Saw Dr. Sheppard Penton, urology last week.  Per pt, he said that she had 4 stones, none of them were obstructice.   No fevers, chills, nausea or vomiting.  On chronic narcotics, wanted to be seen because she is taking more than she normally takes. Usually does not take it as written, only takes a few a day, now taking Norco- 2 tabs by mouth qid!!  Several allergies to other medications.    Current Medications (verified): 1)  Cymbalta 60 Mg Cpep (Duloxetine Hcl) .... Take One Tablet By Mouth Two Times A Day 2)  Lamotrigine 150 Mg Tabs (Lamotrigine) .... Take Two Tablets By Mouth Daily 3)  Hydrocodone-Acetaminophen 10-325 Mg Tabs (Hydrocodone-Acetaminophen) .... Take One and A Half Tablets By Mouth Four Times Daily 4)  Hydrochlorothiazide 25 Mg Tabs (Hydrochlorothiazide) .... Take One Tablet By Mouth Daily 5)  Temazepam 30 Mg Caps (Temazepam) .... Take One Tablet By Mouth Two Times A Day 6)  Hydroxyzine Hcl 25 Mg Tabs (Hydroxyzine Hcl) .Marland Kitchen.. 1 Tab By Mouth Daily. 7)  Lidoderm 5 % Ptch (Lidocaine) .... Apply Two Patches On Skin Daily. 8)  Voltaren 1 %  Gel (Diclofenac Sodium) .... Two Times A Day  To Qid As Needed 9)  Fluocinolone Acetonide 0.01 % Crea (Fluocinolone Acetonide) .... Apply To Area Two Times A Day As Needed 10)  Xopenex Hfa 45 Mcg/act Aero (Levalbuterol Tartrate) 11)  Clobetasol Propionate 0.05 %  Crea (Clobetasol Propionate) .... Aoply To Area Two Times A Day As Needed. 12)  Xopenex Hfa 45 Mcg/act Aero (Levalbuterol Tartrate) .... Use As Directed.  Allergies: 1)  ! Ceclor 2)  ! Voltaren 3)  ! Motrin 4)  ! Advil 5)  ! * Aleve 6)  ! * Abilify  Past History:  Past Medical History: Last updated: 09/08/2010 bipolar disorder multiple sclerosis Depression Osteoarthritis GERD Nephrolithiasis, hx of  Past Surgical History: Last updated: September 05, 2010 Tonsillectomy  Family History: Last updated: 09-05-10 Dad died at 68 of sarcoma Mom- healthy  Social History: Last updated: Sep 05, 2010 recently moved here from [place] to help take care of her mother. lives with mother and sister. has 55 yo son in [place]. Current Smoker Alcohol use-no Drug use-no  Risk Factors: Smoking Status: current (05-Sep-2010)  Review of Systems      See HPI General:  Denies fever. GU:  Complains of dysuria, hematuria, and urinary frequency.  Physical Exam  General:  alert, very talkative, seems to have diffiiculty focusing on one topic. Abdomen:  Bowel sounds positive,abdomen soft and non-tender without masses, organomegaly or hernias noted. Psych:  Cognition and judgment appear intact. Alert and cooperative with normal attention span and concentration. No apparent delusions, illusions, hallucinations   Impression & Recommendations:  Problem # 1:  RENAL CALCULUS, RECURRENT (ICD-592.0) Assessment Deteriorated  Time spent with patient 25 minutes, more than 50% of this time was spent counseling patient on several of her concerns.  Explained to her that I would refill her narcotics but she risks taking too much Tylenol if she is not taking it as prescribed.  Pt understands.  Will request records from urology.   Complete Medication List: 1)  Cymbalta 60 Mg Cpep (Duloxetine hcl) .... Take one tablet by mouth two times a day 2)  Lamotrigine 150 Mg Tabs (Lamotrigine) .... Take two tablets by mouth  daily 3)  Hydrocodone-acetaminophen 10-325 Mg Tabs (Hydrocodone-acetaminophen) .... Take one and a half tablets by mouth four times daily 4)  Hydrochlorothiazide 25 Mg Tabs (Hydrochlorothiazide) .... Take one tablet by mouth daily 5)  Temazepam 30 Mg Caps (Temazepam) .... Take one tablet by mouth two times a day 6)  Hydroxyzine Hcl 25 Mg Tabs (Hydroxyzine hcl) .Marland Kitchen.. 1 tab by mouth daily. 7)  Lidoderm 5 % Ptch (Lidocaine) .... Apply two patches on skin daily. 8)  Voltaren 1 % Gel (Diclofenac sodium) .... Two times a day  to qid as needed 9)  Fluocinolone Acetonide 0.01 % Crea (Fluocinolone acetonide) .... Apply to area two times a day as needed 10)  Xopenex Hfa 45 Mcg/act Aero (Levalbuterol tartrate) 11)  Clobetasol Propionate 0.05 % Crea (Clobetasol propionate) .... Aoply to area two times a day as needed. 12)  Xopenex Hfa 45 Mcg/act Aero (Levalbuterol tartrate) .... Use as directed. Prescriptions: XOPENEX HFA 45 MCG/ACT AERO (LEVALBUTEROL TARTRATE) Use as directed.  #1 x 3   Entered and Authorized by:   Ruthe Mannan MD   Signed by:   Ruthe Mannan MD on 11/27/2010   Method used:   Electronically to        [facility]* (retail)       [address]       [place], [place]  [address]       Ph: [phone]       Fax: [phone]   RxID:   [id number] HYDROCODONE-ACETAMINOPHEN 10-325 MG TABS (HYDROCODONE-ACETAMINOPHEN) take one and a half tablets by mouth four times daily  #360 x 0   Entered and Authorized by:   Ruthe Mannan MD   Signed by:   Ruthe Mannan MD on 11/27/2010   Method used:   Print then Give to Patient   RxID:   [id number] CLOBETASOL PROPIONATE 0.05 % CREA (CLOBETASOL PROPIONATE) aoply to area two times a day as needed.  #30 g x 0   Entered and Authorized by:   Ruthe Mannan MD   Signed by:   Ruthe Mannan MD on 11/27/2010   Method used:   Electronically to        [facility]* (retail)       [address]       [place], [place]  [address]       Ph: [phone]       Fax:  [phone]   RxID:   [id number]    Orders Added: 1)  Est. Patient Level IV [[address]]    Current Allergies (reviewed today): ! CECLOR ! VOLTAREN ! MOTRIN ! ADVIL ! * ALEVE ! * ABILIFY

## 2010-12-18 NOTE — Consult Note (Signed)
Summary: [facility]   Imported By: Maryln Gottron 11/27/2010 15:50:11  _____________________________________________________________________  External Attachment:    Type:   Image     Comment:   External Document

## 2010-12-18 NOTE — Progress Notes (Signed)
Summary: Rx Temazepam  Phone Note Refill Request Call back at [phone] Message from:  [facility] on November 05, 2010 2:46 PM  Refills Requested: Medication #1:  TEMAZEPAM 30 MG CAPS take one tablet by mouth two times a day   Last Refilled: 10/02/2010 Received faxed refill request please advise.   Method Requested: Telephone to Pharmacy Initial call taken by: Linde Gillis CMA Duncan Dull),  November 05, 2010 2:47 PM  Follow-up for Phone Call        Rx called to pharmacy Follow-up by: Linde Gillis CMA Duncan Dull),  November 06, 2010 8:19 AM    Prescriptions: TEMAZEPAM 30 MG CAPS (TEMAZEPAM) take one tablet by mouth two times a day  #60 x 0   Entered and Authorized by:   Ruthe Mannan MD   Signed by:   Ruthe Mannan MD on 11/06/2010   Method used:   Telephoned to ...       [facility]* (retail)       [address]       [place], [place]  [address]       Ph: [phone]       Fax: [phone]   RxID:   [id number]

## 2010-12-18 NOTE — Letter (Signed)
Summary: [employer] F[employer] Form  [employer] Form   Imported By: Beau Fanny 12/12/2010 11:01:07  _____________________________________________________________________  External Attachment:    Type:   Image     Comment:   External Document

## 2010-12-18 NOTE — Progress Notes (Signed)
Summary: ? Appt for FMLA paper work  Phone Note Call from Patient Call back at Work Phone [phone]   Caller: Patient Call For: Ruthe Mannan MD Summary of Call: Patient called because she has FMLA paper work that needs to be filled out so she won't lose her job when she is out of work due to medical problems.  She wants to know if she needs an appt to fill these out or can Dr. Dayton Martes just fill out the paper work since she was just seen last week.  Please advise. Initial call taken by: Linde Gillis CMA Duncan Dull),  December 09, 2010 9:35 AM  Follow-up for Phone Call        I can try to fill them out without an appt. Ruthe Mannan MD  December 09, 2010 9:35 AM  Left a message on cell phone voicemail advising patient to drop off paper work as soon as possible for Dr. Dayton Martes to review and if she doesn't need an appt we will contact her when forms are ready for pick up.  Follow-up by: Linde Gillis CMA Duncan Dull),  December 09, 2010 9:43 AM     Appended Document: ? Appt for FMLA paper work Patient dropped off FMLA paper work today, Dr. Dayton Martes filled it out, I sent it to be scanned into EMR, and put it in the mail today.

## 2010-12-18 NOTE — Assessment & Plan Note (Signed)
Summary: fill out paper work/alc   Vital Signs:  Patient profile:   50 year old female Height:      69.75 inches Weight:      213.50 pounds BMI:     30.97 Temp:     98.7 degrees F oral Pulse rate:   116 / minute Pulse rhythm:   regular BP sitting:   140 / 82  (left arm) Cuff size:   regular  Vitals Entered By: Linde Gillis CMA Duncan Dull) (December 04, 2010 12:00 PM) CC: fill out paper work   History of Present Illness: 50 yo with complicated medical history, including bipolar disorder, progressive MS here to fill out paperwork for accomodations at work.    Due to her MS and kidney stones and medication they she takes to treat them, has frequent urination.  Allowed only two 15 minute breaks per day which is not adequate. At times, feels so rushed, she has had syncopal episodes and fallen.      Current Medications (verified): 1)  Cymbalta 60 Mg Cpep (Duloxetine Hcl) .... Take One Tablet By Mouth Two Times A Day 2)  Lamotrigine 150 Mg Tabs (Lamotrigine) .... Take Two Tablets By Mouth Daily 3)  Hydrocodone-Acetaminophen 10-325 Mg Tabs (Hydrocodone-Acetaminophen) .... Take One and A Half Tablets By Mouth Four Times Daily 4)  Hydrochlorothiazide 25 Mg Tabs (Hydrochlorothiazide) .... Take One Tablet By Mouth Daily 5)  Temazepam 30 Mg Caps (Temazepam) .... Take One Tablet By Mouth Two Times A Day 6)  Hydroxyzine Hcl 25 Mg Tabs (Hydroxyzine Hcl) .Marland Kitchen.. 1 Tab By Mouth Daily. 7)  Lidoderm 5 % Ptch (Lidocaine) .... Apply Two Patches On Skin Daily. 8)  Voltaren 1 %  Gel (Diclofenac Sodium) .... Two Times A Day  To Qid As Needed 9)  Fluocinolone Acetonide 0.01 % Crea (Fluocinolone Acetonide) .... Apply To Area Two Times A Day As Needed 10)  Xopenex Hfa 45 Mcg/act Aero (Levalbuterol Tartrate) 11)  Clobetasol Propionate 0.05 % Crea (Clobetasol Propionate) .... Aoply To Area Two Times A Day As Needed. 12)  Xopenex Hfa 45 Mcg/act Aero (Levalbuterol Tartrate) .... Use As Directed.  Allergies: 1)  !  Ceclor 2)  ! Voltaren 3)  ! Motrin 4)  ! Advil 5)  ! * Aleve 6)  ! * Abilify  Past History:  Past Medical History: Last updated: 09/08/2010 bipolar disorder multiple sclerosis Depression Osteoarthritis GERD Nephrolithiasis, hx of  Past Surgical History: Last updated: 2010-09-13 Tonsillectomy  Family History: Last updated: Sep 13, 2010 Dad died at 86 of sarcoma Mom- healthy  Social History: Last updated: 09-13-2010 recently moved here from [place] to help take care of her mother. lives with mother and sister. has 33 yo son in [place]. Current Smoker Alcohol use-no Drug use-no  Risk Factors: Smoking Status: current (09-13-2010)  Review of Systems      See HPI  Physical Exam  General:  alert, very talkative, seems to have diffiiculty focusing on one topic. Psych:  Cognition and judgment appear intact. Alert and cooperative with normal attention span and concentration. No apparent delusions, illusions, hallucinations   Impression & Recommendations:  Problem # 1:  MULTIPLE SCLEROSIS, PROGRESSIVE/RELAPSING (ICD-340) Assessment Deteriorated Time spent with patient 25 minutes, more than 50% of this time was spent counseling patient on how to fill out paperwork.  Forms filled out and returned to pt asking for increased breaks at work.  Complete Medication List: 1)  Cymbalta 60 Mg Cpep (Duloxetine hcl) .... Take one tablet by mouth two times a day  2)  Lamotrigine 150 Mg Tabs (Lamotrigine) .... Take two tablets by mouth daily 3)  Hydrocodone-acetaminophen 10-325 Mg Tabs (Hydrocodone-acetaminophen) .... Take one and a half tablets by mouth four times daily 4)  Hydrochlorothiazide 25 Mg Tabs (Hydrochlorothiazide) .... Take one tablet by mouth daily 5)  Temazepam 30 Mg Caps (Temazepam) .... Take one tablet by mouth two times a day 6)  Hydroxyzine Hcl 25 Mg Tabs (Hydroxyzine hcl) .Marland Kitchen.. 1 tab by mouth daily. 7)  Lidoderm 5 % Ptch (Lidocaine) .... Apply two patches on skin daily. 8)   Voltaren 1 % Gel (Diclofenac sodium) .... Two times a day  to qid as needed 9)  Fluocinolone Acetonide 0.01 % Crea (Fluocinolone acetonide) .... Apply to area two times a day as needed 10)  Xopenex Hfa 45 Mcg/act Aero (Levalbuterol tartrate) 11)  Clobetasol Propionate 0.05 % Crea (Clobetasol propionate) .... Aoply to area two times a day as needed. 12)  Xopenex Hfa 45 Mcg/act Aero (Levalbuterol tartrate) .... Use as directed.   Orders Added: 1)  Est. Patient Level IV [[address]]    Current Allergies (reviewed today): ! CECLOR ! VOLTAREN ! MOTRIN ! ADVIL ! * ALEVE ! * ABILIFY

## 2010-12-18 NOTE — Progress Notes (Signed)
Summary: Clarification on Lidoderm patches  Phone Note From Pharmacy Call back at ph [phone] ext [phone] fax [phone]   Caller: [facility] Call For: Dr. Dayton Martes  Summary of Call: Pharmacy sent another fax wanting clarification on Lidoderm patches.  We gave authorization for nine patches but they can give #180 for a 90 day supply.  Please advise.  Form in your IN box. Initial call taken by: Linde Gillis CMA Duncan Dull),  October 28, 2010 3:00 PM  Follow-up for Phone Call        In my box. Ruthe Mannan MD  October 29, 2010 7:32 AM  Form faxed back to [facility] at [phone].  Follow-up by: Linde Gillis CMA Duncan Dull),  October 29, 2010 8:32 AM

## 2010-12-18 NOTE — Progress Notes (Signed)
Summary: ? Passed kidney stones  Phone Note Call from Patient Call back at Work Phone [phone]   Caller: Patient Call For: Ruthe Mannan MD Summary of Call: Patient called to let Dr. Dayton Martes know that she has been taking more pain medication than usual.  She thinks she may have passed several kidney stones, four at work, and one at home.  She has also been experiencing pain in her right arm and left breast which she thinks is coming from passing the kidney stones.  She needs a refill on her pain medications and thought that she needed to come in to be seen.  I scheduled her an appt for tomorrow at 12:00.  If she doesn't need to keep that appt I will call her back and let her know in the morning.  Please advise. Initial call taken by: Linde Gillis CMA Duncan Dull),  November 26, 2010 4:26 PM  Follow-up for Phone Call        Agreed, thank you. Ruthe Mannan MD  November 27, 2010 7:55 AM

## 2010-12-18 NOTE — Assessment & Plan Note (Signed)
Summary: ? KIDNEY STONE/NT   Vital Signs:  Patient profile:   50 year old female Height:      69.75 inches Weight:      213.50 pounds BMI:     30.97 Temp:     99.5 degrees F oral Pulse rate:   80 / minute Pulse rhythm:   regular BP sitting:   140 / 82  (left arm) Cuff size:   regular  Vitals Entered By: Linde Gillis CMA Duncan Dull) (November 13, 2010 9:50 AM) CC: ? kidney stone   History of Present Illness: 50 yo with complicated medical history, including bipolar disorder, progressive MS here for ? kidney stone.  Received records from Dr. Suzy Bouchard (urology) in [place].  Notes reviewed.  Uncleared what type of stones were removed.  Still has stones remaining. Had previous right ureteral caclulus s/p laser lithotripsy, stent placement and subsquent stent infection.    Last week, started to develop similar symptoms- pain in back and abdomen.  Two days ago, noticed redish foam in toilet after urinating.  No fevers, chills, nausea or vomiting.   Allergies: 1)  ! Ceclor 2)  ! Voltaren 3)  ! Motrin 4)  ! Advil 5)  ! * Aleve 6)  ! * Abilify  Review of Systems      See HPI General:  Denies chills and fever. GI:  Complains of abdominal pain; denies nausea and vomiting. GU:  Complains of dysuria and hematuria.  Physical Exam  General:  alert, very talkative, seems to have diffiiculty focusing on one topic. Abdomen:  Bowel sounds positive,abdomen soft and non-tender without masses, organomegaly or hernias noted. Psych:  Cognition and judgment appear intact. Alert and cooperative with normal attention span and concentration. No apparent delusions, illusions, hallucinations   Impression & Recommendations:  Problem # 1:  RENAL CALCULUS, RECURRENT (ICD-592.0) Assessment Deteriorated UA pos for microscopic/gross hematuria with known ureteral calculi.  Will send to urology for futher work up and treatment.   Orders: Urology Referral (Urology)  Complete Medication List: 1)   Cymbalta 60 Mg Cpep (Duloxetine hcl) .... Take one tablet by mouth two times a day 2)  Lamotrigine 150 Mg Tabs (Lamotrigine) .... Take two tablets by mouth daily 3)  Hydrocodone-acetaminophen 10-325 Mg Tabs (Hydrocodone-acetaminophen) .... Take one and a half tablets by mouth four times daily 4)  Hydrochlorothiazide 25 Mg Tabs (Hydrochlorothiazide) .... Take one tablet by mouth daily 5)  Temazepam 30 Mg Caps (Temazepam) .... Take one tablet by mouth two times a day 6)  Hydroxyzine Hcl 25 Mg Tabs (Hydroxyzine hcl) .Marland Kitchen.. 1 tab by mouth daily. 7)  Lidoderm 5 % Ptch (Lidocaine) .... Apply two patches on skin daily. 8)  Voltaren 1 % Gel (Diclofenac sodium) .... Two times a day  to qid as needed 9)  Fluocinolone Acetonide 0.01 % Crea (Fluocinolone acetonide) .... Apply to area two times a day as needed 10)  Xopenex Hfa 45 Mcg/act Aero (Levalbuterol tartrate) 11)  Ciprofloxacin Hcl 500 Mg Tabs (Ciprofloxacin hcl) .... Take one by mouth two times a day x 7 days  Other Orders: UA Dipstick w/o Micro (manual) ([address])  Patient Instructions: 1)  Please stop by to see Shirlee Limerick on your way out.   Orders Added: 1)  Urology Referral [Urology] 2)  UA Dipstick w/o Micro (manual) [81002] 3)  Est. Patient Level IV [60454]    Current Allergies (reviewed today): ! CECLOR ! VOLTAREN ! MOTRIN ! ADVIL ! * ALEVE ! * ABILIFY  Laboratory Results  Urine Tests  Date/Time Received: November 13, 2010 10:06 AM   Routine Urinalysis   Color: yellow Appearance: Clear Glucose: negative   (Normal Range: Negative) Bilirubin: negative   (Normal Range: Negative) Ketone: negative   (Normal Range: Negative) Spec. Gravity: 1.010   (Normal Range: 1.003-1.035) Blood: moderate   (Normal Range: Negative) pH: 6.0   (Normal Range: 5.0-8.0) Protein: trace   (Normal Range: Negative) Urobilinogen: 0.2   (Normal Range: 0-1) Nitrite: negative   (Normal Range: Negative) Leukocyte Esterace: negative   (Normal Range:  Negative)

## 2010-12-18 NOTE — Letter (Signed)
Summary: 03/30/07 - OPERATIVE NOTE / RIGHT URETERAL CALCULUS / DR. CARL CA  03/30/07 - OPERATIVE NOTE / RIGHT URETERAL CALCULUS / DR. CARL CAPELOUTO   Imported By: Carin Primrose 11/12/2010 08:43:16  _____________________________________________________________________  External Attachment:    Type:   Image     Comment:   External Document

## 2010-12-18 NOTE — Progress Notes (Signed)
Summary: refill request for temazepam  Phone Note Refill Request Message from:  Fax from Pharmacy  Refills Requested: Medication #1:  TEMAZEPAM 30 MG CAPS take one tablet by mouth two times a day   Last Refilled: 11/06/2010 Faxed request from [facility], [phone].  Initial call taken by: Lowella Petties CMA, AAMA,  December 02, 2010 9:37 AM  Follow-up for Phone Call        Rx called to [facility] pharmacy. Follow-up by: Linde Gillis CMA Duncan Dull),  December 02, 2010 10:48 AM    Prescriptions: TEMAZEPAM 30 MG CAPS (TEMAZEPAM) take one tablet by mouth two times a day  #60 x 0   Entered and Authorized by:   Ruthe Mannan MD   Signed by:   Ruthe Mannan MD on 12/02/2010   Method used:   Telephoned to ...       [facility] (mail-order)             , [place]         Ph: [phone]       Fax:    RxID:   [id number]

## 2010-12-19 ENCOUNTER — Encounter: Payer: Self-pay | Admitting: Family Medicine

## 2010-12-19 ENCOUNTER — Ambulatory Visit (INDEPENDENT_AMBULATORY_CARE_PROVIDER_SITE_OTHER): Payer: BC Managed Care – PPO | Admitting: Family Medicine

## 2010-12-19 DIAGNOSIS — J209 Acute bronchitis, unspecified: Secondary | ICD-10-CM

## 2010-12-19 NOTE — Medication Information (Signed)
Summary: Voltaren Approved  Voltaren Approved   Imported By: Maryln Gottron 09/22/2010 15:44:34  _____________________________________________________________________  External Attachment:    Type:   Image     Comment:   External Document

## 2010-12-19 NOTE — Letter (Signed)
Summary: Records from [facility] 2009 - 2011  Records from [facility] 2009 - 2011   Imported By: Maryln Gottron 10/10/2010 11:15:56  _____________________________________________________________________  External Attachment:    Type:   Image     Comment:   External Document

## 2010-12-22 ENCOUNTER — Telehealth: Payer: Self-pay | Admitting: Family Medicine

## 2010-12-24 ENCOUNTER — Encounter: Payer: Self-pay | Admitting: Family Medicine

## 2010-12-24 ENCOUNTER — Ambulatory Visit (INDEPENDENT_AMBULATORY_CARE_PROVIDER_SITE_OTHER): Payer: BC Managed Care – PPO | Admitting: Family Medicine

## 2010-12-24 DIAGNOSIS — J209 Acute bronchitis, unspecified: Secondary | ICD-10-CM

## 2010-12-24 DIAGNOSIS — H5710 Ocular pain, unspecified eye: Secondary | ICD-10-CM | POA: Insufficient documentation

## 2010-12-24 DIAGNOSIS — B029 Zoster without complications: Secondary | ICD-10-CM

## 2010-12-24 NOTE — Assessment & Plan Note (Signed)
Summary: ?WELTS ON BOTTOM,SWOLLEN GLANDS/   Vital Signs:  Patient profile:   50 year old female Height:      69.75 inches Weight:      212 pounds BMI:     30.75 Temp:     98.9 degrees F oral Pulse rate:   92 / minute Pulse rhythm:   regular BP sitting:   140 / 90  (left arm) Cuff size:   regular  Vitals Entered By: Linde Gillis CMA Duncan Dull) (December 15, 2010 2:50 PM) CC: welts on bottom   History of Present Illness: 50 yo with complicated medical history, including bipolar disorder, progressive MS here for painful sore on her buttocks.  Left buttocks was painful yesterday but noticed the rash this morning. Never had anything like this before.   No fevers or chills.  Needs her hydrocodone RX early.  Has taken more over the past several days.    Current Medications (verified): 1)  Cymbalta 60 Mg Cpep (Duloxetine Hcl) .... Take One Tablet By Mouth Two Times A Day 2)  Lamotrigine 150 Mg Tabs (Lamotrigine) .... Take Two Tablets By Mouth Daily 3)  Hydrocodone-Acetaminophen 10-325 Mg Tabs (Hydrocodone-Acetaminophen) .... Take One and A Half Tablets By Mouth Four Times Daily 4)  Hydrochlorothiazide 25 Mg Tabs (Hydrochlorothiazide) .... Take One Tablet By Mouth Daily 5)  Temazepam 30 Mg Caps (Temazepam) .... Take One Tablet By Mouth Two Times A Day 6)  Hydroxyzine Hcl 25 Mg Tabs (Hydroxyzine Hcl) .Marland Kitchen.. 1 Tab By Mouth Daily. 7)  Lidoderm 5 % Ptch (Lidocaine) .... Apply Two Patches On Skin Daily. 8)  Voltaren 1 %  Gel (Diclofenac Sodium) .... Two Times A Day  To Qid As Needed 9)  Fluocinolone Acetonide 0.01 % Crea (Fluocinolone Acetonide) .... Apply To Area Two Times A Day As Needed 10)  Xopenex Hfa 45 Mcg/act Aero (Levalbuterol Tartrate) 11)  Clobetasol Propionate 0.05 % Crea (Clobetasol Propionate) .... Aoply To Area Two Times A Day As Needed. 12)  Xopenex Hfa 45 Mcg/act Aero (Levalbuterol Tartrate) .... Use As Directed. 13)  Valtrex 1 Gm Tabs (Valacyclovir Hcl) .Marland Kitchen.. 1 Tab By Mouth  Three Times A Day X 7 Days  Allergies: 1)  ! Ceclor 2)  ! Voltaren 3)  ! Motrin 4)  ! Advil 5)  ! * Aleve 6)  ! * Abilify  Past History:  Past Medical History: Last updated: 09/08/2010 bipolar disorder multiple sclerosis Depression Osteoarthritis GERD Nephrolithiasis, hx of  Past Surgical History: Last updated: 09-18-10 Tonsillectomy  Family History: Last updated: 09/18/2010 Dad died at 53 of sarcoma Mom- healthy  Social History: Last updated: 18-Sep-2010 recently moved here from [place] to help take care of her mother. lives with mother and sister. has 34 yo son in [place]. Current Smoker Alcohol use-no Drug use-no  Risk Factors: Smoking Status: current (2010/09/18)  Review of Systems      See HPI General:  Denies fever and malaise. Derm:  Complains of rash.  Physical Exam  General:  alert, very talkative, seems to have diffiiculty focusing on one topic. Skin:  left buttocks- erythematous rash with raised pustules, not open Psych:  Cognition and judgment appear intact. Alert and cooperative with normal attention span and concentration. No apparent delusions, illusions, hallucinations   Impression & Recommendations:  Problem # 1:  HERPES ZOSTER (ICD-053.9) Assessment New Valtrex 1 g three times a day x 7 days. Refilled her hydrocodone early given increased pain. Precautions discussed- not to be around any infants under the age of  1 or anyone immunocompromised until after rash has scabbed over.  Complete Medication List: 1)  Cymbalta 60 Mg Cpep (Duloxetine hcl) .... Take one tablet by mouth two times a day 2)  Lamotrigine 150 Mg Tabs (Lamotrigine) .... Take two tablets by mouth daily 3)  Hydrocodone-acetaminophen 10-325 Mg Tabs (Hydrocodone-acetaminophen) .... Take one and a half tablets by mouth four times daily 4)  Hydrochlorothiazide 25 Mg Tabs (Hydrochlorothiazide) .... Take one tablet by mouth daily 5)  Temazepam 30 Mg Caps (Temazepam) .... Take one  tablet by mouth two times a day 6)  Hydroxyzine Hcl 25 Mg Tabs (Hydroxyzine hcl) .Marland Kitchen.. 1 tab by mouth daily. 7)  Lidoderm 5 % Ptch (Lidocaine) .... Apply two patches on skin daily. 8)  Voltaren 1 % Gel (Diclofenac sodium) .... Two times a day  to qid as needed 9)  Fluocinolone Acetonide 0.01 % Crea (Fluocinolone acetonide) .... Apply to area two times a day as needed 10)  Xopenex Hfa 45 Mcg/act Aero (Levalbuterol tartrate) 11)  Clobetasol Propionate 0.05 % Crea (Clobetasol propionate) .... Aoply to area two times a day as needed. 12)  Xopenex Hfa 45 Mcg/act Aero (Levalbuterol tartrate) .... Use as directed. 13)  Valtrex 1 Gm Tabs (Valacyclovir hcl) .Marland Kitchen.. 1 tab by mouth three times a day x 7 days Prescriptions: HYDROCODONE-ACETAMINOPHEN 10-325 MG TABS (HYDROCODONE-ACETAMINOPHEN) take one and a half tablets by mouth four times daily  #360 x 0   Entered and Authorized by:   Ruthe Mannan MD   Signed by:   Ruthe Mannan MD on 12/15/2010   Method used:   Printed then faxed to ...       [facility]* (retail)       [address]       [place], [place]  [address]       Ph: [phone]       Fax: [phone]   RxID:   [id number] VALTREX 1 GM TABS (VALACYCLOVIR HCL) 1 tab by mouth three times a day x 7 days  #21 x 0   Entered and Authorized by:   Ruthe Mannan MD   Signed by:   Ruthe Mannan MD on 12/15/2010   Method used:   Electronically to        [facility]* (retail)       [address]       [place], [place]  [address]       Ph: [phone]       Fax: [phone]   RxID:   [id number]    Orders Added: 1)  Est. Patient Level IV [884[address]]    Current Allergies (reviewed today): ! CECLOR ! VOLTAREN ! MOTRIN ! ADVIL ! * ALEVE ! * ABILIFY

## 2010-12-24 NOTE — Progress Notes (Signed)
Summary: pharmacy requests phone call  Phone Note From Pharmacy   Caller: Baird Lyons at [facility]  [phone] Summary of Call: Baird Lyons is asking that you call her regarding pt's vicodin script that she got today.  Baird Lyons says she got a 30 day supply on 1/16. Initial call taken by: Lowella Petties CMA, AAMA,  December 15, 2010 3:26 PM  Follow-up for Phone Call        Tea, Please let them know that I know she received narcotics RX early, has had more pain, now has shingles.  We talked about trying to space out her meds. Ruthe Mannan MD  December 15, 2010 3:34 PM  Maureen Ralphs at [facility] as instructed.  She stated that patients insurance will only pay for a 30 day supply at a time just for future reference.  Linde Gillis CMA Duncan Dull)  December 15, 2010 3:44 PM    Additional Follow-up for Phone Call Additional follow up Details #1::        ok thank you. Ruthe Mannan MD  December 15, 2010 3:45 PM

## 2010-12-24 NOTE — Progress Notes (Signed)
Summary: regarding FMLA paperwork  Phone Note Other Incoming   Caller: Emily Moon with [employer]  [phone], claim [id number] Summary of Call: Rep from [facility] called asking for clarification on FMLA papers that were sent in.  They are asking if time off is for MS or urinary frequency, estimated number of days off, how long to certify.  I spoke with the patient and she said she needs the time for her MS, estimates that she needs one to ten days a month off, minimal.  Insurance rep said that they only certify for six months at a time.  Follow-up for Phone Call        ok that's fine. Ruthe Mannan MD  December 17, 2010 9:40 AM  Advised pt, doctor will discuss at pt's appt tomorrow. Follow-up by: Emily Moon CMA, AAMA,  December 17, 2010 10:15 AM

## 2010-12-24 NOTE — Progress Notes (Signed)
Summary: [employer] FMLA paper work faxed  Phone Note Call from Patient Call back at Work Phone [phone]   Caller: Patient Call For: Ruthe Mannan MD Summary of Call: Per patients request FMLA paper work was faxed to [facility] at [phone]. Initial call taken by: Linde Gillis CMA Duncan Dull),  December 16, 2010 3:07 PM     Appended Document: [employer] FMLA paper work faxed Revised copy of FMLA paper work faxed to [employer] at [phone].

## 2010-12-24 NOTE — Progress Notes (Signed)
Summary: valtrex is causing headaches  Phone Note Call from Patient Call back at Home Phone [phone] Call back at Work Phone [phone]   Caller: Patient Call For: Ruthe Mannan MD Summary of Call: Pt was told to take 3 valtrex a day but she is only able to take 2 because it causes headaches.  Is that going to be enough? Initial call taken by: Lowella Petties CMA, AAMA,  December 17, 2010 9:24 AM  Follow-up for Phone Call        3 a day is what has been studied and recommended so I cannot tell you for sure that 2 will be enough.  However, 2 is better than none. Ruthe Mannan MD  December 17, 2010 9:36 AM   Additional Follow-up for Phone Call Additional follow up Details #1::        Advised pt, she says she absolutely cant take more than 2 a day because of the headaches. Additional Follow-up by: Lowella Petties CMA, AAMA,  December 17, 2010 10:16 AM

## 2010-12-24 NOTE — Assessment & Plan Note (Signed)
Summary: ? PNUEMONIA, DISCUSS FORMS   Vital Signs:  Patient profile:   50 year old female Height:      69.75 inches Weight:      215.50 pounds BMI:     31.26 O2 Sat:      98 % on Room air Temp:     99.4 degrees F oral Pulse rate:   102 / minute Pulse rhythm:   regular BP sitting:   122 / 80  (left arm) Cuff size:   large  Vitals Entered By: Linde Gillis CMA (AAMA) (December 19, 2010 3:30 PM)  O2 Flow:  Room air CC: ? pneumonia, discuss FMLA forms   History of Present Illness: 50 yo with complicated medical history, including bipolar disorder, progressive MS here for:  ?PNA- started coughing a few days ago, subjective fever. This morning, productive of brown sputum. Feels like she is wheezing.  No CP or SOB. No runny nose or ear pain.  Shingles rash slightly improved, still painful.    Current Medications (verified): 1)  Cymbalta 60 Mg Cpep (Duloxetine Hcl) .... Take One Tablet By Mouth Two Times A Day 2)  Lamotrigine 150 Mg Tabs (Lamotrigine) .... Take Two Tablets By Mouth Daily 3)  Hydrocodone-Acetaminophen 10-325 Mg Tabs (Hydrocodone-Acetaminophen) .... Take One and A Half Tablets By Mouth Four Times Daily 4)  Hydrochlorothiazide 25 Mg Tabs (Hydrochlorothiazide) .... Take One Tablet By Mouth Daily 5)  Temazepam 30 Mg Caps (Temazepam) .... Take One Tablet By Mouth Two Times A Day 6)  Hydroxyzine Hcl 25 Mg Tabs (Hydroxyzine Hcl) .Marland Kitchen.. 1 Tab By Mouth Daily. 7)  Lidoderm 5 % Ptch (Lidocaine) .... Apply Two Patches On Skin Daily. 8)  Voltaren 1 %  Gel (Diclofenac Sodium) .... Two Times A Day  To Qid As Needed 9)  Fluocinolone Acetonide 0.01 % Crea (Fluocinolone Acetonide) .... Apply To Area Two Times A Day As Needed 10)  Xopenex Hfa 45 Mcg/act Aero (Levalbuterol Tartrate) 11)  Clobetasol Propionate 0.05 % Crea (Clobetasol Propionate) .... Aoply To Area Two Times A Day As Needed. 12)  Xopenex Hfa 45 Mcg/act Aero (Levalbuterol Tartrate) .... Use As Directed. 13)  Valtrex 1 Gm  Tabs (Valacyclovir Hcl) .Marland Kitchen.. 1 Tab By Mouth Three Times A Day X 7 Days 14)  Azithromycin 250 Mg  Tabs (Azithromycin) .... 2 By  Mouth Today and Then 1 Daily For 4 Days  Allergies: 1)  ! Ceclor 2)  ! Voltaren 3)  ! Motrin 4)  ! Advil 5)  ! * Aleve 6)  ! * Abilify  Past History:  Past Medical History: Last updated: 09/08/2010 bipolar disorder multiple sclerosis Depression Osteoarthritis GERD Nephrolithiasis, hx of  Past Surgical History: Last updated: 2010-08-29 Tonsillectomy  Family History: Last updated: Aug 29, 2010 Dad died at 38 of sarcoma Mom- healthy  Social History: Last updated: August 29, 2010 recently moved here from [place] to help take care of her mother. lives with mother and sister. has 30 yo son in [place]. Current Smoker Alcohol use-no Drug use-no  Risk Factors: Smoking Status: current (08/29/2010)  Review of Systems      See HPI General:  Complains of chills and fever. ENT:  Denies earache, nasal congestion, sinus pressure, and sore throat. CV:  Denies chest pain or discomfort. Resp:  Complains of cough, sputum productive, and wheezing.  Physical Exam  General:  alert, very talkative, seems to have diffiiculty focusing on one topic. Lungs:  Normal respiratory effort, chest expands symmetrically. Exp wheezes right lower lung base, no crackles,  Heart:  Normal rate and regular rhythm. S1 and S2 normal without gallop, murmur, click, rub or other extra sounds. Psych:  Cognition and judgment appear intact. Alert and cooperative with normal attention span and concentration. No apparent delusions, illusions, hallucinations   Impression & Recommendations:  Problem # 1:  ACUTE BRONCHITIS (ICD-466.0) Assessment New Given her complicated history and lung exam findings, will treat for bacterial bronchitis with Zpack. Continue Xopenex as needed. Her updated medication list for this problem includes:    Xopenex Hfa 45 Mcg/act Aero (Levalbuterol tartrate)    Xopenex  Hfa 45 Mcg/act Aero (Levalbuterol tartrate) ..... Use as directed.    Azithromycin 250 Mg Tabs (Azithromycin) .Marland Kitchen... 2 by  mouth today and then 1 daily for 4 days  Complete Medication List: 1)  Cymbalta 60 Mg Cpep (Duloxetine hcl) .... Take one tablet by mouth two times a day 2)  Lamotrigine 150 Mg Tabs (Lamotrigine) .... Take two tablets by mouth daily 3)  Hydrocodone-acetaminophen 10-325 Mg Tabs (Hydrocodone-acetaminophen) .... Take one and a half tablets by mouth four times daily 4)  Hydrochlorothiazide 25 Mg Tabs (Hydrochlorothiazide) .... Take one tablet by mouth daily 5)  Temazepam 30 Mg Caps (Temazepam) .... Take one tablet by mouth two times a day 6)  Hydroxyzine Hcl 25 Mg Tabs (Hydroxyzine hcl) .Marland Kitchen.. 1 tab by mouth daily. 7)  Lidoderm 5 % Ptch (Lidocaine) .... Apply two patches on skin daily. 8)  Voltaren 1 % Gel (Diclofenac sodium) .... Two times a day  to qid as needed 9)  Fluocinolone Acetonide 0.01 % Crea (Fluocinolone acetonide) .... Apply to area two times a day as needed 10)  Xopenex Hfa 45 Mcg/act Aero (Levalbuterol tartrate) 11)  Clobetasol Propionate 0.05 % Crea (Clobetasol propionate) .... Aoply to area two times a day as needed. 12)  Xopenex Hfa 45 Mcg/act Aero (Levalbuterol tartrate) .... Use as directed. 13)  Valtrex 1 Gm Tabs (Valacyclovir hcl) .Marland Kitchen.. 1 tab by mouth three times a day x 7 days 14)  Azithromycin 250 Mg Tabs (Azithromycin) .... 2 by  mouth today and then 1 daily for 4 days Prescriptions: AZITHROMYCIN 250 MG  TABS (AZITHROMYCIN) 2 by  mouth today and then 1 daily for 4 days  #6 x 0   Entered and Authorized by:   Ruthe Mannan MD   Signed by:   Ruthe Mannan MD on 12/19/2010   Method used:   Electronically to        [facility]* (retail)       [address]       [place], [place]  [address]       Ph: [phone]       Fax: [phone]   RxID:   [id number]    Orders Added: 1)  Est. Patient Level IV [84132]    Current Allergies (reviewed today): !  CECLOR ! VOLTAREN ! MOTRIN ! ADVIL ! * ALEVE ! * ABILIFY

## 2010-12-29 ENCOUNTER — Encounter: Payer: Self-pay | Admitting: Family Medicine

## 2010-12-29 ENCOUNTER — Telehealth: Payer: Self-pay | Admitting: Family Medicine

## 2010-12-31 ENCOUNTER — Telehealth: Payer: Self-pay | Admitting: Family Medicine

## 2011-01-01 ENCOUNTER — Telehealth: Payer: Self-pay | Admitting: Family Medicine

## 2011-01-01 NOTE — Progress Notes (Signed)
Summary: Changes to FMLA paper work  Phone Note Call from Patient Call back at [employer] [phone]   Caller: Patient Call For: Ruthe Mannan MD Summary of Call: Patient called and wants [place] to change her FMLA paper work.  As of now it states frequency: 2 times per 1 week every week, and duration: 16 hours or 1 day per episode.  Ms. Cape wants it to state frequency: 5 days a week, duration: 40 hours or 5 days per episode.  She says this way if she is out of work more days she cannot lose her job.  Also, [employer] will be faxing over paper or short term disability for Ms. Rubey having the Shingles and Bronchitis.  She missed days of work related to these conditions.   Initial call taken by: Linde Gillis CMA Duncan Dull),  December 22, 2010 2:33 PM  Follow-up for Phone Call        Please tell her that I filled it out the way that she requested.  I cannot write for her to be out 5 days a week, duration of 40 hours!  Ruthe Mannan MD  December 22, 2010 2:36 PM  Patient wants to know what is the longest lenght of time that Dr. Dayton Martes will write for?  She says that if she is out of work for longer than two days she may lose her job.  Please advise.  Linde Gillis CMA Duncan Dull)  December 22, 2010 2:45 PM   Additional Follow-up for Phone Call Additional follow up Details #1::        will discuss at her appt tomorrow. Ruthe Mannan MD  December 23, 2010 7:18 AM

## 2011-01-01 NOTE — Letter (Signed)
Summary: Out of Work  [employer] [facility]  [address] [place], [place] [address]   Phone: [phone]  Fax: [phone]    December 24, 2010   Employee:  EARL ZELLMER    To Whom It May Concern:   Ms. Macbride is ok to return to work on Monday, February 13th.  If you need additional information, please feel free to contact our office.         Sincerely,    Ruthe Mannan MD

## 2011-01-01 NOTE — Progress Notes (Signed)
Summary: ? regarding FMLA paper work  Phone Note Other Incoming Call back at [phone]   Caller: [employer] FMLA dept Summary of Call: Lanora Manis called to verify length and duration of patients absences from work that Dr. Dayton Martes wrote on the [facility] paper work.  Dr. Dayton Martes should specify number or weeks and number of months.  This is item number 7 on the FMLA paper work.  Form in your IN box.   Initial call taken by: Linde Gillis CMA Duncan Dull),  December 22, 2010 9:57 AM  Follow-up for Phone Call        Spoke with Dr. Dayton Martes and the frequency is 2 times per 1 week every week and every month, duration 16 hours or 1 day per episode.  Called and spoke with Lanora Manis at [place] and they will just certify the paper work as is and if any additional information is needed they will let me know.  She advised me that I did not have to refax the paper work. Follow-up by: Linde Gillis CMA Duncan Dull),  December 22, 2010 10:04 AM

## 2011-01-01 NOTE — Assessment & Plan Note (Signed)
Summary: F/U/CLE   BCBS   Vital Signs:  Patient profile:   50 year old female Height:      69.75 inches Weight:      215.25 pounds BMI:     31.22 Temp:     99.6 degrees F oral Pulse rate:   95 / minute Pulse rhythm:   regular BP sitting:   120 / 80  (left arm) Cuff size:   large  Vitals Entered By: Linde Gillis CMA Duncan Dull) (December 24, 2010 12:35 PM) CC: follow up, paper work   History of Present Illness: 50 yo with complicated medical history, including bipolar disorder, progressive MS here for follow up and eye pain.  left eye eye pain and redness- bent down to get something a few days ago and poked herself in the eye with her thumb.  Hurt and teared up immediately.  Continues to have irritation and tearing.  Conjuctiva red and now she is complaining of some blurred vision.:  bronchitis- finished Zpack yesterday.  Feels a little better but still has low grade temp and coughing.  Shingles rash slightly improved, still painful.  Needs more FMLA paperwork filled out since she was out for shingles pain.    Current Medications (verified): 1)  Cymbalta 60 Mg Cpep (Duloxetine Hcl) .... Take One Tablet By Mouth Two Times A Day 2)  Lamotrigine 150 Mg Tabs (Lamotrigine) .... Take Two Tablets By Mouth Daily 3)  Hydrocodone-Acetaminophen 10-325 Mg Tabs (Hydrocodone-Acetaminophen) .... Take One and A Half Tablets By Mouth Four Times Daily 4)  Hydrochlorothiazide 25 Mg Tabs (Hydrochlorothiazide) .... Take One Tablet By Mouth Daily 5)  Temazepam 30 Mg Caps (Temazepam) .... Take One Tablet By Mouth Two Times A Day 6)  Hydroxyzine Hcl 25 Mg Tabs (Hydroxyzine Hcl) .Marland Kitchen.. 1 Tab By Mouth Daily. 7)  Lidoderm 5 % Ptch (Lidocaine) .... Apply Two Patches On Skin Daily. 8)  Voltaren 1 %  Gel (Diclofenac Sodium) .... Two Times A Day  To Qid As Needed 9)  Fluocinolone Acetonide 0.01 % Crea (Fluocinolone Acetonide) .... Apply To Area Two Times A Day As Needed 10)  Xopenex Hfa 45 Mcg/act Aero  (Levalbuterol Tartrate) 11)  Clobetasol Propionate 0.05 % Crea (Clobetasol Propionate) .... Aoply To Area Two Times A Day As Needed. 12)  Xopenex Hfa 45 Mcg/act Aero (Levalbuterol Tartrate) .... Use As Directed. 13)  Valtrex 1 Gm Tabs (Valacyclovir Hcl) .Marland Kitchen.. 1 Tab By Mouth Three Times A Day X 7 Days  Allergies: 1)  ! Ceclor 2)  ! Voltaren 3)  ! Motrin 4)  ! Advil 5)  ! * Aleve 6)  ! * Abilify  Past History:  Past Medical History: Last updated: 09/08/2010 bipolar disorder multiple sclerosis Depression Osteoarthritis GERD Nephrolithiasis, hx of  Past Surgical History: Last updated: 09-12-10 Tonsillectomy  Family History: Last updated: 2010-09-12 Dad died at 10 of sarcoma Mom- healthy  Social History: Last updated: 09/12/10 recently moved here from [place] to help take care of her mother. lives with mother and sister. has 7 yo son in [place]. Current Smoker Alcohol use-no Drug use-no  Risk Factors: Smoking Status: current (09-12-10)  Review of Systems      See HPI General:  Complains of fever. Eyes:  Complains of blurring, eye pain, and light sensitivity; denies discharge, double vision, vision loss-1 eye, and vision loss-both eyes. CV:  Denies chest pain or discomfort. Resp:  Complains of cough, sputum productive, and wheezing; denies shortness of breath.  Physical Exam  General:  alert, very talkative, seems to have diffiiculty focusing on one topic. Eyes:  vision grossly intact, pupils equal, and pupils round.  Left eye- no photophobia or tearing when light shined in eye, conjunctival injection.    Mouth:  good dentition.   Lungs:  Normal respiratory effort, chest expands symmetrically. no wheezes or crackles today Heart:  Normal rate and regular rhythm. S1 and S2 normal without gallop, murmur, click, rub or other extra sounds. Skin:  left buttocks- erythematous rash with raised pustules, not open improved Psych:  Cognition and judgment appear intact. Alert  and cooperative with normal attention span and concentration. No apparent delusions, illusions, hallucinations   Impression & Recommendations:  Problem # 1:  EYE PAIN, LEFT (ICD-379.91) Assessment New Referral to optho for dilated eye exam.  LIkely a corneal abrasion but given blurred vision and eye pain, needs further work up. Orders: Ophthalmology Referral (Ophthalmology)  Problem # 2:  HERPES ZOSTER (ICD-053.9) Assessment: Unchanged  not changed much.  ?if not something other than zoster.  If rash persists next week, will biopsy. s/p Valtrex FMLA paperwork completed in office.  Orders: Form Completion (33295)  Problem # 3:  ACUTE BRONCHITIS (ICD-466.0) Assessment: Improved Continue Xopenex.   Her updated medication list for this problem includes:    Xopenex Hfa 45 Mcg/act Aero (Levalbuterol tartrate)    Xopenex Hfa 45 Mcg/act Aero (Levalbuterol tartrate) ..... Use as directed.  Complete Medication List: 1)  Cymbalta 60 Mg Cpep (Duloxetine hcl) .... Take one tablet by mouth two times a day 2)  Lamotrigine 150 Mg Tabs (Lamotrigine) .... Take two tablets by mouth daily 3)  Hydrocodone-acetaminophen 10-325 Mg Tabs (Hydrocodone-acetaminophen) .... Take one and a half tablets by mouth four times daily 4)  Hydrochlorothiazide 25 Mg Tabs (Hydrochlorothiazide) .... Take one tablet by mouth daily 5)  Temazepam 30 Mg Caps (Temazepam) .... Take one tablet by mouth two times a day 6)  Hydroxyzine Hcl 25 Mg Tabs (Hydroxyzine hcl) .Marland Kitchen.. 1 tab by mouth daily. 7)  Lidoderm 5 % Ptch (Lidocaine) .... Apply two patches on skin daily. 8)  Voltaren 1 % Gel (Diclofenac sodium) .... Two times a day  to qid as needed 9)  Fluocinolone Acetonide 0.01 % Crea (Fluocinolone acetonide) .... Apply to area two times a day as needed 10)  Xopenex Hfa 45 Mcg/act Aero (Levalbuterol tartrate) 11)  Clobetasol Propionate 0.05 % Crea (Clobetasol propionate) .... Aoply to area two times a day as needed. 12)  Xopenex  Hfa 45 Mcg/act Aero (Levalbuterol tartrate) .... Use as directed. 13)  Valtrex 1 Gm Tabs (Valacyclovir hcl) .Marland Kitchen.. 1 tab by mouth three times a day x 7 days  Patient Instructions: 1)  Please stop by to see Shirlee Limerick on your way out.   Orders Added: 1)  Ophthalmology Referral [Ophthalmology] 2)  Est. Patient Level IV [18841] 3)  Form Completion [66063]    Current Allergies (reviewed today): ! CECLOR ! VOLTAREN ! MOTRIN ! ADVIL ! * ALEVE ! * ABILIFY  Appended Document: F/U/CLE   BCBS Disability paper work faxed to [employer] at [phone].

## 2011-01-01 NOTE — Progress Notes (Signed)
Summary: [employer] paper work  Phone Note Other Incoming Call back at fax [phone]   Caller: [employer]-claim number [id number] Summary of Call: Received faxed claim from [facility] for Emily Moon.  Form is in your IN box.  Patient has a f/u appt on 12/24/2010 at 12:30. Initial call taken by: Linde Gillis CMA Duncan Dull),  December 22, 2010 4:28 PM  Follow-up for Phone Call        ok, will fill out at her appt tomorrow. Ruthe Mannan MD  December 23, 2010 7:18 AM

## 2011-01-05 ENCOUNTER — Encounter: Payer: Self-pay | Admitting: Family Medicine

## 2011-01-05 ENCOUNTER — Ambulatory Visit (INDEPENDENT_AMBULATORY_CARE_PROVIDER_SITE_OTHER): Payer: BC Managed Care – PPO | Admitting: Family Medicine

## 2011-01-05 DIAGNOSIS — J209 Acute bronchitis, unspecified: Secondary | ICD-10-CM

## 2011-01-07 ENCOUNTER — Telehealth: Payer: Self-pay | Admitting: Family Medicine

## 2011-01-07 ENCOUNTER — Encounter: Payer: Self-pay | Admitting: Family Medicine

## 2011-01-07 DIAGNOSIS — G894 Chronic pain syndrome: Secondary | ICD-10-CM | POA: Insufficient documentation

## 2011-01-07 HISTORY — DX: Chronic pain syndrome: G89.4

## 2011-01-07 NOTE — Miscellaneous (Signed)
Summary: prevention update  Clinical Lists Changes  Observations: Added new observation of PAP DUE: 04/26/2011 (12/29/2010 13:39) Added new observation of LAST PAP DAT: 04/25/2009 (04/25/2009 13:39) Added new observation of PAP SMEAR: normal (04/25/2009 13:39)     PAP Result Date:  04/25/2009 PAP Result:  normal PAP Next Due:  2 yr

## 2011-01-07 NOTE — Letter (Signed)
Summary: [facility]   Imported By: Kassie Mends 01/02/2011 10:53:03  _____________________________________________________________________  External Attachment:    Type:   Image     Comment:   External Document

## 2011-01-07 NOTE — Letter (Signed)
Summary: [employer] Disability Form  [employer] Disability Form   Imported By: Beau Fanny 12/31/2010 08:32:30  _____________________________________________________________________  External Attachment:    Type:   Image     Comment:   External Document

## 2011-01-07 NOTE — Progress Notes (Signed)
Summary: refill request for temazepam  Phone Note Refill Request Message from:  Fax from Pharmacy  Refills Requested: Medication #1:  TEMAZEPAM 30 MG CAPS take one tablet by mouth two times a day   Last Refilled: 12/02/2010 Faxed request from [place], [phone].  Initial call taken by: Lowella Petties CMA, AAMA,  January 01, 2011 9:42 AM  Follow-up for Phone Call        Rx called to pharmacy Follow-up by: Linde Gillis CMA Duncan Dull),  January 01, 2011 9:47 AM    Prescriptions: TEMAZEPAM 30 MG CAPS (TEMAZEPAM) take one tablet by mouth two times a day  #60 x 0   Entered and Authorized by:   Ruthe Mannan MD   Signed by:   Ruthe Mannan MD on 01/01/2011   Method used:   Telephoned to ...       [facility]* (retail)       [address]       [place], [place]  [address]       Ph: [phone]       Fax: [phone]   RxID:   [id number]

## 2011-01-07 NOTE — Progress Notes (Signed)
Summary: Swollen ankle  Phone Note Call from Patient Call back at Work Phone [phone]   Caller: Patient Call For: Ruthe Mannan MD Summary of Call: Patient called and stated that her left ankle is swollen and she have pitting edema.  She has been trying her best to keep her leg elevated but wanted to know what you suggest she do.  Please advise. Initial call taken by: Linde Gillis CMA Duncan Dull),  December 31, 2010 11:54 AM  Follow-up for Phone Call        I would advise leg elevation.   If symptoms do not improve, needs to be seen. Ruthe Mannan MD  December 31, 2010 11:55 AM  Patient advised as instructed via telephone.  Follow-up by: Linde Gillis CMA Duncan Dull),  December 31, 2010 11:57 AM

## 2011-01-07 NOTE — Progress Notes (Signed)
Summary: refill request for vicodin  Phone Note Refill Request Message from:  Patient  Refills Requested: Medication #1:  HYDROCODONE-ACETAMINOPHEN 10-325 MG TABS take one and a half tablets by mouth four times daily   Last Refilled: 12/15/2010 Faxed request from [place], [phone].   Initial call taken by: Lowella Petties CMA, AAMA,  December 29, 2010 10:02 AM  Follow-up for Phone Call        Rx called to pharmacy Follow-up by: Linde Gillis CMA Duncan Dull),  December 29, 2010 10:44 AM    Prescriptions: HYDROCODONE-ACETAMINOPHEN 10-325 MG TABS (HYDROCODONE-ACETAMINOPHEN) take one and a half tablets by mouth four times daily  #360 x 0   Entered and Authorized by:   Ruthe Mannan MD   Signed by:   Ruthe Mannan MD on 12/29/2010   Method used:   Telephoned to ...       [facility]* (retail)       [address]       [place], [place]  [address]       Ph: [phone]       Fax: [phone]   RxID:   [id number]

## 2011-01-08 ENCOUNTER — Telehealth: Payer: Self-pay | Admitting: Family Medicine

## 2011-01-13 ENCOUNTER — Encounter: Payer: Self-pay | Admitting: Family Medicine

## 2011-01-13 NOTE — Miscellaneous (Signed)
Summary: Orders Update  Clinical Lists Changes  Problems: Added new problem of CHRONIC PAIN SYNDROME (ICD-338.4) Orders: Added new Referral order of Pain Clinic Referral (Pain) - Signed

## 2011-01-13 NOTE — Progress Notes (Signed)
Summary: Rx HCTZ, Cymbalta, Lamotrigine & Hydroxyzine  Phone Note Refill Request Message from:  Patient on January 08, 2011 11:33 AM  Refills Requested: Medication #1:  HYDROCHLOROTHIAZIDE 25 MG TABS take one tablet by mouth daily   Supply Requested: 3 months  Medication #2:  CYMBALTA 60 MG CPEP take one tablet by mouth two times a day   Supply Requested: 3 months  Medication #3:  LAMOTRIGINE 150 MG TABS take two tablets by mouth daily   Supply Requested: 3 months  Medication #4:  HYDROXYZINE HCL 25 MG TABS 1 tab by mouth daily.   Supply Requested: 3 months Patient request 90 day supply be sent to [facility].  Please advise.   Method Requested: Fax to Mail Away Pharmacy Initial call taken by: Linde Gillis CMA Duncan Dull),  January 08, 2011 11:40 AM    Prescriptions: HYDROXYZINE HCL 25 MG TABS (HYDROXYZINE HCL) 1 tab by mouth daily.  #90 x 3   Entered and Authorized by:   Ruthe Mannan MD   Signed by:   Ruthe Mannan MD on 01/08/2011   Method used:   Electronically to        [facility]* (mail-order)       [address]       [place], [place]  [address]       Ph: [phone]       Fax: [phone]   RxID:   [id number] CYMBALTA 60 MG CPEP (DULOXETINE HCL) take one tablet by mouth two times a day  #180 x 3   Entered and Authorized by:   Ruthe Mannan MD   Signed by:   Ruthe Mannan MD on 01/08/2011   Method used:   Electronically to        [facility]* (mail-order)       [address]       [place], [place]  [address]       Ph: [phone]       Fax: [phone]   RxID:   [id number] LAMOTRIGINE 150 MG TABS (LAMOTRIGINE) take two tablets by mouth daily  #180 x 3   Entered and Authorized by:   Ruthe Mannan MD   Signed by:   Ruthe Mannan MD on 01/08/2011   Method used:   Electronically to        [facility]* (mail-order)       [address]       [place], [place]  [address]       Ph:  [phone]       Fax: [phone]   RxID:   [id number]

## 2011-01-13 NOTE — Assessment & Plan Note (Signed)
Summary: COUGH/CLE   BCBS   Vital Signs:  Patient profile:   50 year old female Height:      69.75 inches Weight:      214.50 pounds BMI:     31.11 Temp:     99.6 degrees F oral Pulse rate:   104 / minute Pulse rhythm:   regular BP sitting:   130 / 90  (left arm) Cuff size:   large  Vitals Entered By: Linde Gillis CMA Duncan Dull) (January 05, 2011 10:39 AM) CC: cough, congestion, not feeling well   History of Present Illness: 50 yo with complicated medical history, including bipolar disorder, progressive MS here for follow up cough.  bronchitis- given Zpack on 2/3.    Felt better for a week but then cough became more productive. Feels feverish and sweaty every night for past week. No CP or SOB.  No wheezing.     Current Medications (verified): 1)  Cymbalta 60 Mg Cpep (Duloxetine Hcl) .... Take One Tablet By Mouth Two Times A Day 2)  Lamotrigine 150 Mg Tabs (Lamotrigine) .... Take Two Tablets By Mouth Daily 3)  Hydrocodone-Acetaminophen 10-325 Mg Tabs (Hydrocodone-Acetaminophen) .... Take One and A Half Tablets By Mouth Four Times Daily 4)  Hydrochlorothiazide 25 Mg Tabs (Hydrochlorothiazide) .... Take One Tablet By Mouth Daily 5)  Temazepam 30 Mg Caps (Temazepam) .... Take One Tablet By Mouth Two Times A Day 6)  Hydroxyzine Hcl 25 Mg Tabs (Hydroxyzine Hcl) .Marland Kitchen.. 1 Tab By Mouth Daily. 7)  Lidoderm 5 % Ptch (Lidocaine) .... Apply Two Patches On Skin Daily. 8)  Voltaren 1 %  Gel (Diclofenac Sodium) .... Two Times A Day  To Qid As Needed 9)  Fluocinolone Acetonide 0.01 % Crea (Fluocinolone Acetonide) .... Apply To Area Two Times A Day As Needed 10)  Xopenex Hfa 45 Mcg/act Aero (Levalbuterol Tartrate) 11)  Clobetasol Propionate 0.05 % Crea (Clobetasol Propionate) .... Aoply To Area Two Times A Day As Needed. 12)  Xopenex Hfa 45 Mcg/act Aero (Levalbuterol Tartrate) .... Use As Directed. 13)  Valtrex 1 Gm Tabs (Valacyclovir Hcl) .Marland Kitchen.. 1 Tab By Mouth Three Times A Day X 7 Days 14)   Avelox 400 Mg  Tabs (Moxifloxacin Hcl) .Marland Kitchen.. 1 Tablet By Mouth Daily X 5 Days  Allergies: 1)  ! Ceclor 2)  ! Voltaren 3)  ! Motrin 4)  ! Advil 5)  ! * Aleve 6)  ! * Abilify  Past History:  Past Medical History: Last updated: 09/08/2010 bipolar disorder multiple sclerosis Depression Osteoarthritis GERD Nephrolithiasis, hx of  Past Surgical History: Last updated: September 19, 2010 Tonsillectomy  Family History: Last updated: 2010-09-19 Dad died at 97 of sarcoma Mom- healthy  Social History: Last updated: 09-19-10 recently moved here from [place] to help take care of her mother. lives with mother and sister. has 65 yo son in [place]. Current Smoker Alcohol use-no Drug use-no  Risk Factors: Smoking Status: current (2010-09-19)  Review of Systems      See HPI General:  Complains of chills and fever. CV:  Denies chest pain or discomfort. Resp:  Complains of cough and sputum productive; denies shortness of breath.  Physical Exam  General:  alert, very talkative, seems to have diffiiculty focusing on one topic. Lungs:  Normal respiratory effort, chest expands symmetrically. ronchi bilateraly, right>left Heart:  Normal rate and regular rhythm. S1 and S2 normal without gallop, murmur, click, rub or other extra sounds. Extremities:  no edema Psych:  Cognition and judgment appear intact. Alert and cooperative  with normal attention span and concentration. No apparent delusions, illusions, hallucinations   Impression & Recommendations:  Problem # 1:  ACUTE BRONCHITIS (ICD-466.0) Assessment Deteriorated in pt with h/o PNA. will treat with avelox, if symptoms do not improve, will order CXR. continue xopenex as needed.  Her updated medication list for this problem includes:    Xopenex Hfa 45 Mcg/act Aero (Levalbuterol tartrate)    Xopenex Hfa 45 Mcg/act Aero (Levalbuterol tartrate) ..... Use as directed.    Avelox 400 Mg Tabs (Moxifloxacin hcl) .Marland Kitchen... 1 tablet by mouth daily x 5  days  Complete Medication List: 1)  Cymbalta 60 Mg Cpep (Duloxetine hcl) .... Take one tablet by mouth two times a day 2)  Lamotrigine 150 Mg Tabs (Lamotrigine) .... Take two tablets by mouth daily 3)  Hydrocodone-acetaminophen 10-325 Mg Tabs (Hydrocodone-acetaminophen) .... Take one and a half tablets by mouth four times daily 4)  Hydrochlorothiazide 25 Mg Tabs (Hydrochlorothiazide) .... Take one tablet by mouth daily 5)  Temazepam 30 Mg Caps (Temazepam) .... Take one tablet by mouth two times a day 6)  Hydroxyzine Hcl 25 Mg Tabs (Hydroxyzine hcl) .Marland Kitchen.. 1 tab by mouth daily. 7)  Lidoderm 5 % Ptch (Lidocaine) .... Apply two patches on skin daily. 8)  Voltaren 1 % Gel (Diclofenac sodium) .... Two times a day  to qid as needed 9)  Fluocinolone Acetonide 0.01 % Crea (Fluocinolone acetonide) .... Apply to area two times a day as needed 10)  Xopenex Hfa 45 Mcg/act Aero (Levalbuterol tartrate) 11)  Clobetasol Propionate 0.05 % Crea (Clobetasol propionate) .... Aoply to area two times a day as needed. 12)  Xopenex Hfa 45 Mcg/act Aero (Levalbuterol tartrate) .... Use as directed. 13)  Valtrex 1 Gm Tabs (Valacyclovir hcl) .Marland Kitchen.. 1 tab by mouth three times a day x 7 days 14)  Avelox 400 Mg Tabs (Moxifloxacin hcl) .Marland Kitchen.. 1 tablet by mouth daily x 5 days Prescriptions: AVELOX 400 MG  TABS (MOXIFLOXACIN HCL) 1 tablet by mouth daily x 5 days  #5 x 0   Entered and Authorized by:   Ruthe Mannan MD   Signed by:   Ruthe Mannan MD on 01/05/2011   Method used:   Electronically to        [facility]* (retail)       [address]       [place], [place]  [address]       Ph: [phone]       Fax: [phone]   RxID:   [id number]    Orders Added: 1)  Est. Patient Level IV [84132]    Current Allergies (reviewed today): ! CECLOR ! VOLTAREN ! MOTRIN ! ADVIL ! * ALEVE ! * ABILIFY

## 2011-01-13 NOTE — Progress Notes (Signed)
Summary: Pain medication  Phone Note Refill Request Message from:  Patient on January 07, 2011 9:20 AM  Refills Requested: Medication #1:  HYDROCODONE-ACETAMINOPHEN 10-325 MG TABS take one and a half tablets by mouth four times daily   Last Refilled: 12/29/2010 Patient stated that she needs her refill.  She is paying out of pocket so it shouldn't matter as regards to insurance purposes.  She stated that she has been taking more pain medication due to her having the shingles.  Is it ok for me to call [facility] and authorize them to give her the other 180 pills since she only picked up 180 the last time?  Please advise.   Method Requested: Telephone to Pharmacy Initial call taken by: Linde Gillis CMA Duncan Dull),  January 07, 2011 9:23 AM  Follow-up for Phone Call        yes ok to authorize what they have on file.  No other rx will be sent though. Ruthe Mannan MD  January 07, 2011 9:27 AM  Left message on voicemail at pharmacy advising as instructed.    Follow-up by: Linde Gillis CMA Duncan Dull),  January 07, 2011 9:32 AM     Appended Document: Pain medication Spoke with Rob from [facility], he stated that the pharmacy [place] when Ms. Yeske first moved here and started using [facility] to be careful because she has been known for wanting pain medication, early refills, etc.  Rob just wanted Dr. Dayton Martes to be aware of this.  Appended Document: Pain medication thank you.  I had my suspicion of this.  Will need to refer to pain management at this point.  I will place referral.  Please let Ms. Marmolejos know that as we discussed previously, I am uncomfortable with quantity of medication and will be referring to pain clinic.  Appended Document: Pain medication Spoke with patient and advised her as instructed.  She stated that she did not want Dr. Dayton Martes to feel uncomfortable in any way.  She stated that she will go to the pain clinic and do whatever she needs to  do.

## 2011-01-14 ENCOUNTER — Telehealth: Payer: Self-pay | Admitting: Family Medicine

## 2011-01-15 ENCOUNTER — Ambulatory Visit (INDEPENDENT_AMBULATORY_CARE_PROVIDER_SITE_OTHER): Payer: BC Managed Care – PPO | Admitting: Family Medicine

## 2011-01-15 ENCOUNTER — Encounter: Payer: Self-pay | Admitting: Family Medicine

## 2011-01-15 DIAGNOSIS — J209 Acute bronchitis, unspecified: Secondary | ICD-10-CM

## 2011-01-15 DIAGNOSIS — B029 Zoster without complications: Secondary | ICD-10-CM

## 2011-01-15 DIAGNOSIS — R062 Wheezing: Secondary | ICD-10-CM

## 2011-01-16 ENCOUNTER — Telehealth: Payer: Self-pay | Admitting: Family Medicine

## 2011-01-16 DIAGNOSIS — R002 Palpitations: Secondary | ICD-10-CM | POA: Insufficient documentation

## 2011-01-17 ENCOUNTER — Observation Stay: Payer: Self-pay | Admitting: Internal Medicine

## 2011-01-17 IMAGING — CT CT CHEST W/ CM
2 of 3 series · 16 of 30 positions shown, 19 images · IV contrast (APPLIED)
Comparison: None

REASON FOR EXAM: Chest Pain
COMMENTS:

PROCEDURE:     CT  - CT CHEST (FOR PE) W  - [DATE]  [DATE]
RESULT:     Indications: Chest pain
TECHNIQUE: A thin-section spiral CT from the lung apices to the upper
abdomen was acquired on a multi slice scanner following 100ml [UH]
intravenous contrast. These images were then transferred to the Siemens work
station and were subsequently reviewed utilizing 3-D reconstructions and MIP
images.

[Series 6: soft tissue · axial · 0.77mm/px · z∈[+364,+463]mm · 4 of 94 slices shown]
[im 8/94  mediastinal]
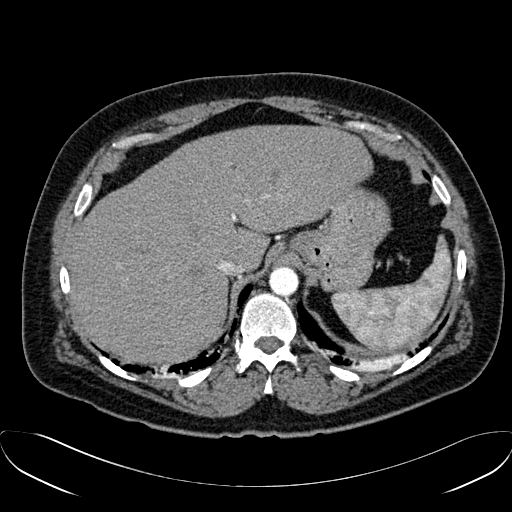
[im 22/94  mediastinal]
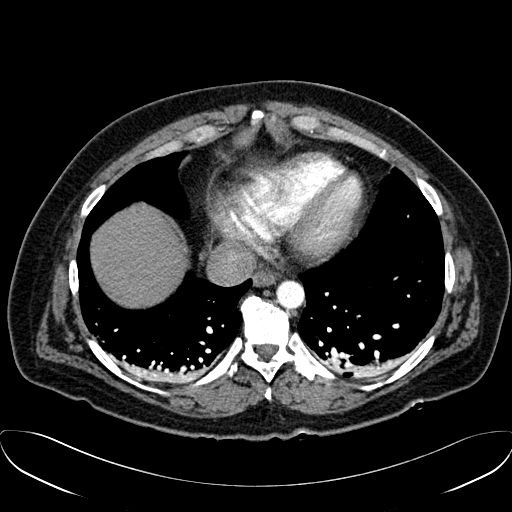
[im 36/94  mediastinal]
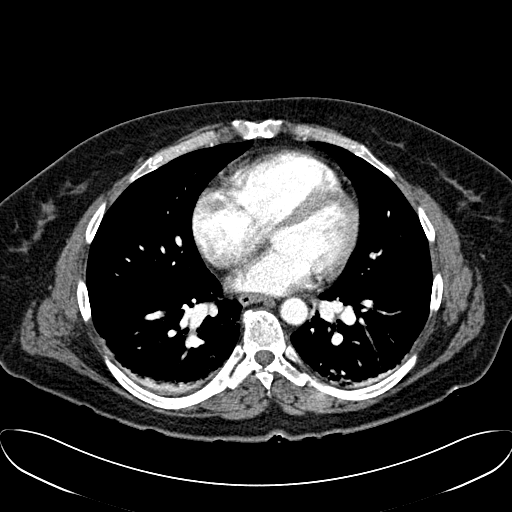
[im 41/94  mediastinal]
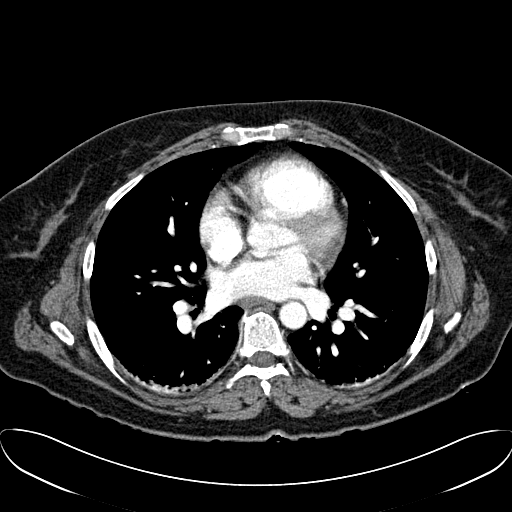

[Series 7: lung windows · axial · 0.77mm/px · z∈[+370,+598]mm · 12 of 92 slices shown, 15 images]
[im 8/92  mediastinal]
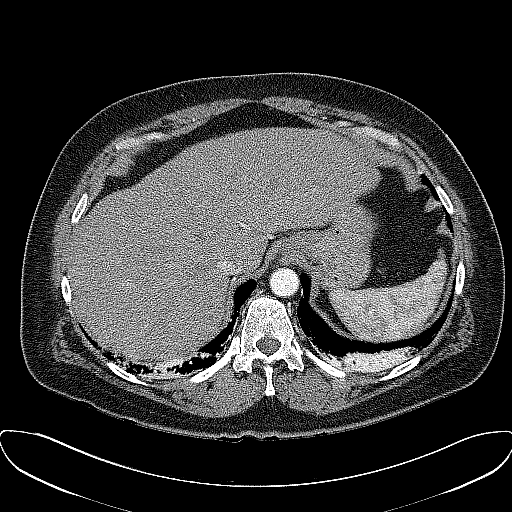
[im 8/92  lung]
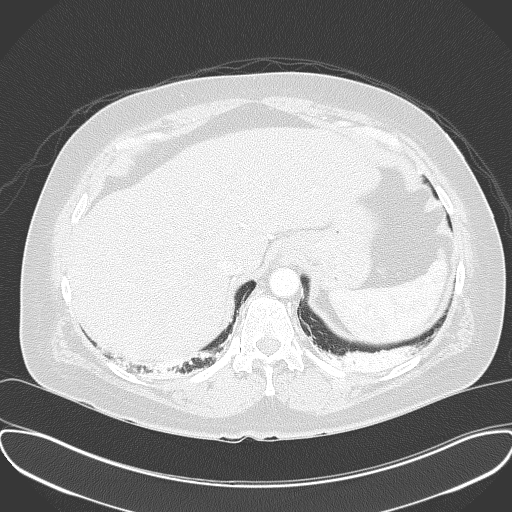
[im 16/92  lung]
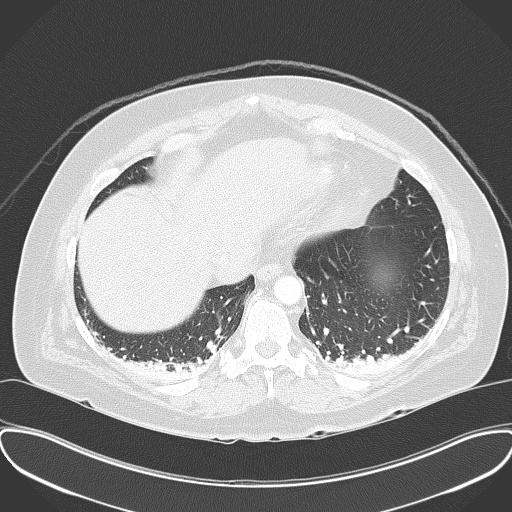
[im 23/92  lung]
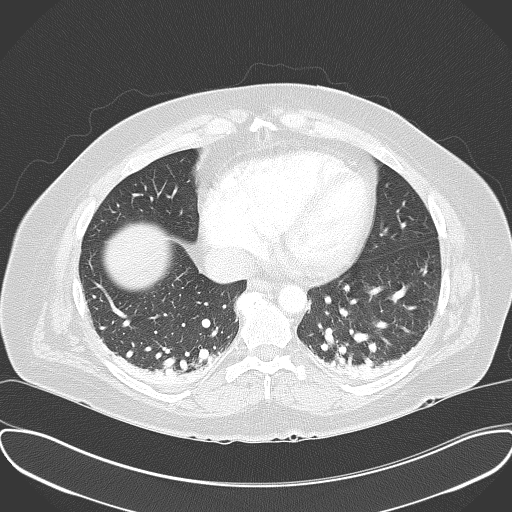
[im 31/92  lung]
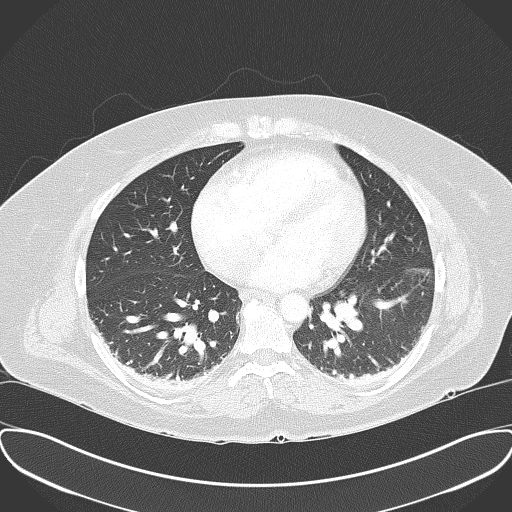
[im 38/92  mediastinal]
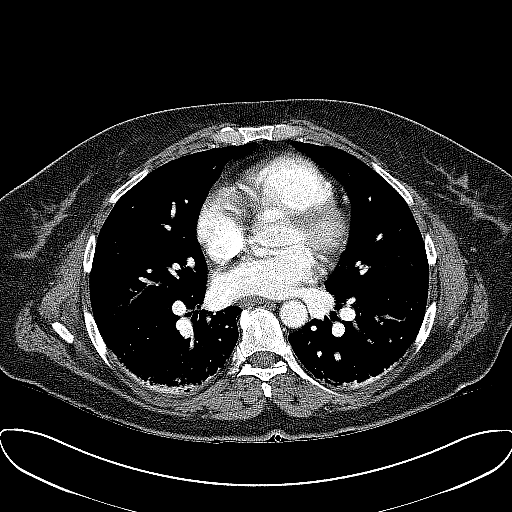
[im 38/92  lung]
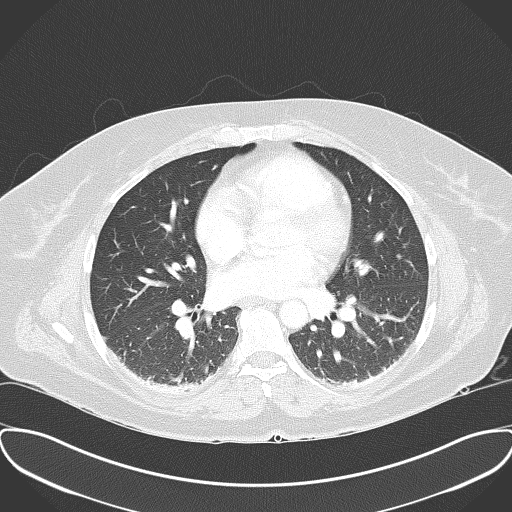
[im 39/92  lung]
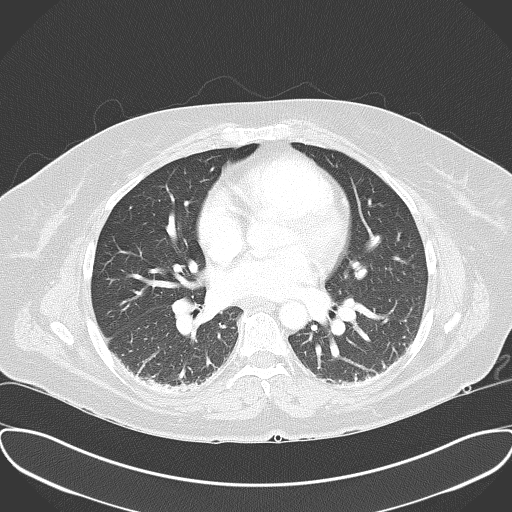
[im 46/92  lung]
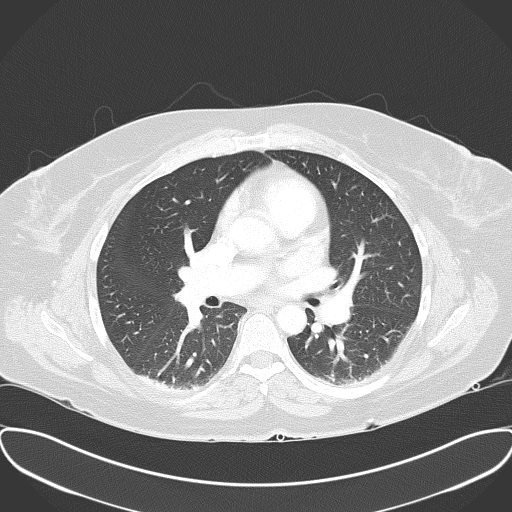
[im 54/92  lung]
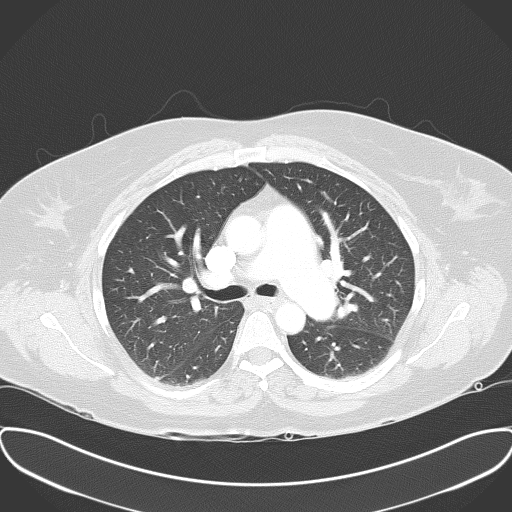
[im 61/92  mediastinal]
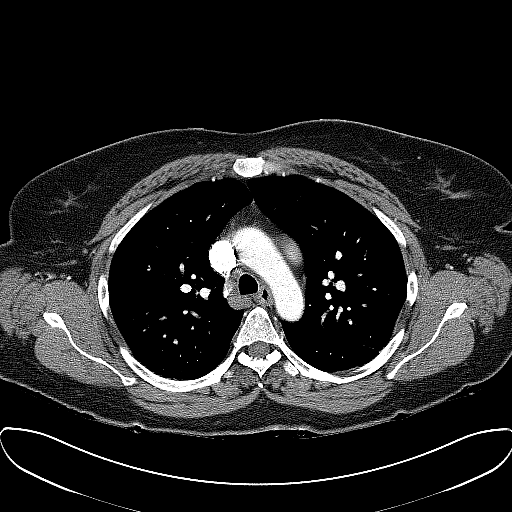
[im 61/92  lung]
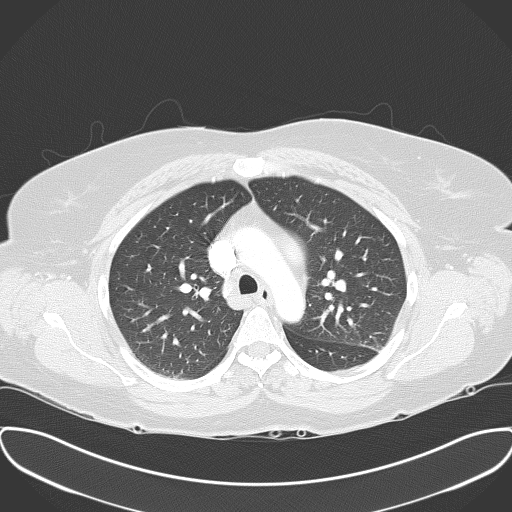
[im 69/92  lung]
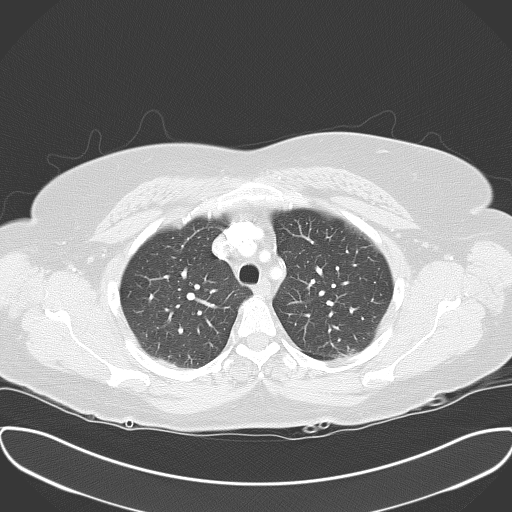
[im 76/92  lung]
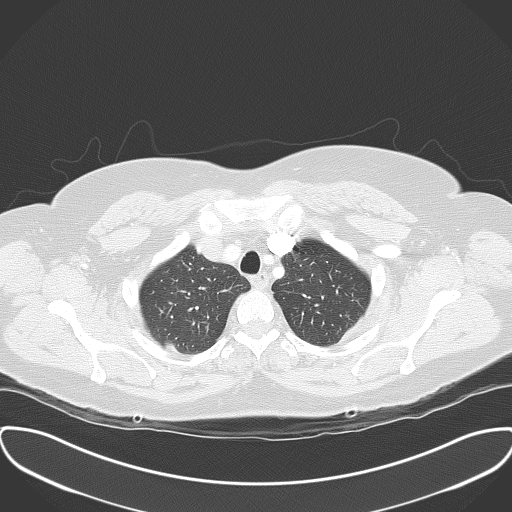
[im 84/92  lung]
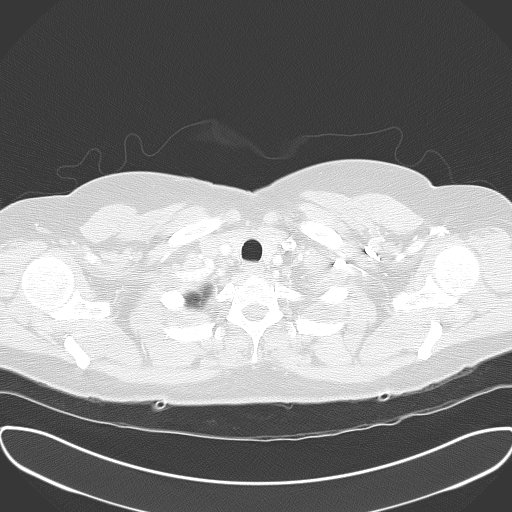

[16 of 30 positions shown; findings below may reference images not displayed]

FINDINGS: There is adequate opacification of the pulmonary arteries. There is no
pulmonary embolus. The main pulmonary artery, right main pulmonary artery,
and left main pulmonary arteries are normal in size. The heart size is
normal. There is no pericardial effusion.

There is bibasilar atelectasis. There is no focal consolidation, pleural
effusion, or pneumothorax.

There is no axillary, hilar, or mediastinal adenopathy.

The osseous structures are unremarkable.

The visualized portions of the upper abdomen are unremarkable.
IMPRESSION: 1. No CT evidence of pulmonary embolus.

## 2011-01-17 IMAGING — CR DG CHEST 2V
1 series · 3 of 3 positions shown · non-contrast
Comparison: none

REASON FOR EXAM: Chest Pain
COMMENTS:

PROCEDURE:     DXR - DXR CHEST PA (OR AP) AND LATERAL  - [DATE]  [DATE]
RESULT:     Comparison: None

[Series 1: view not recorded · 0.17mm/px · 3 of 3 slices shown]
[im 1/3]
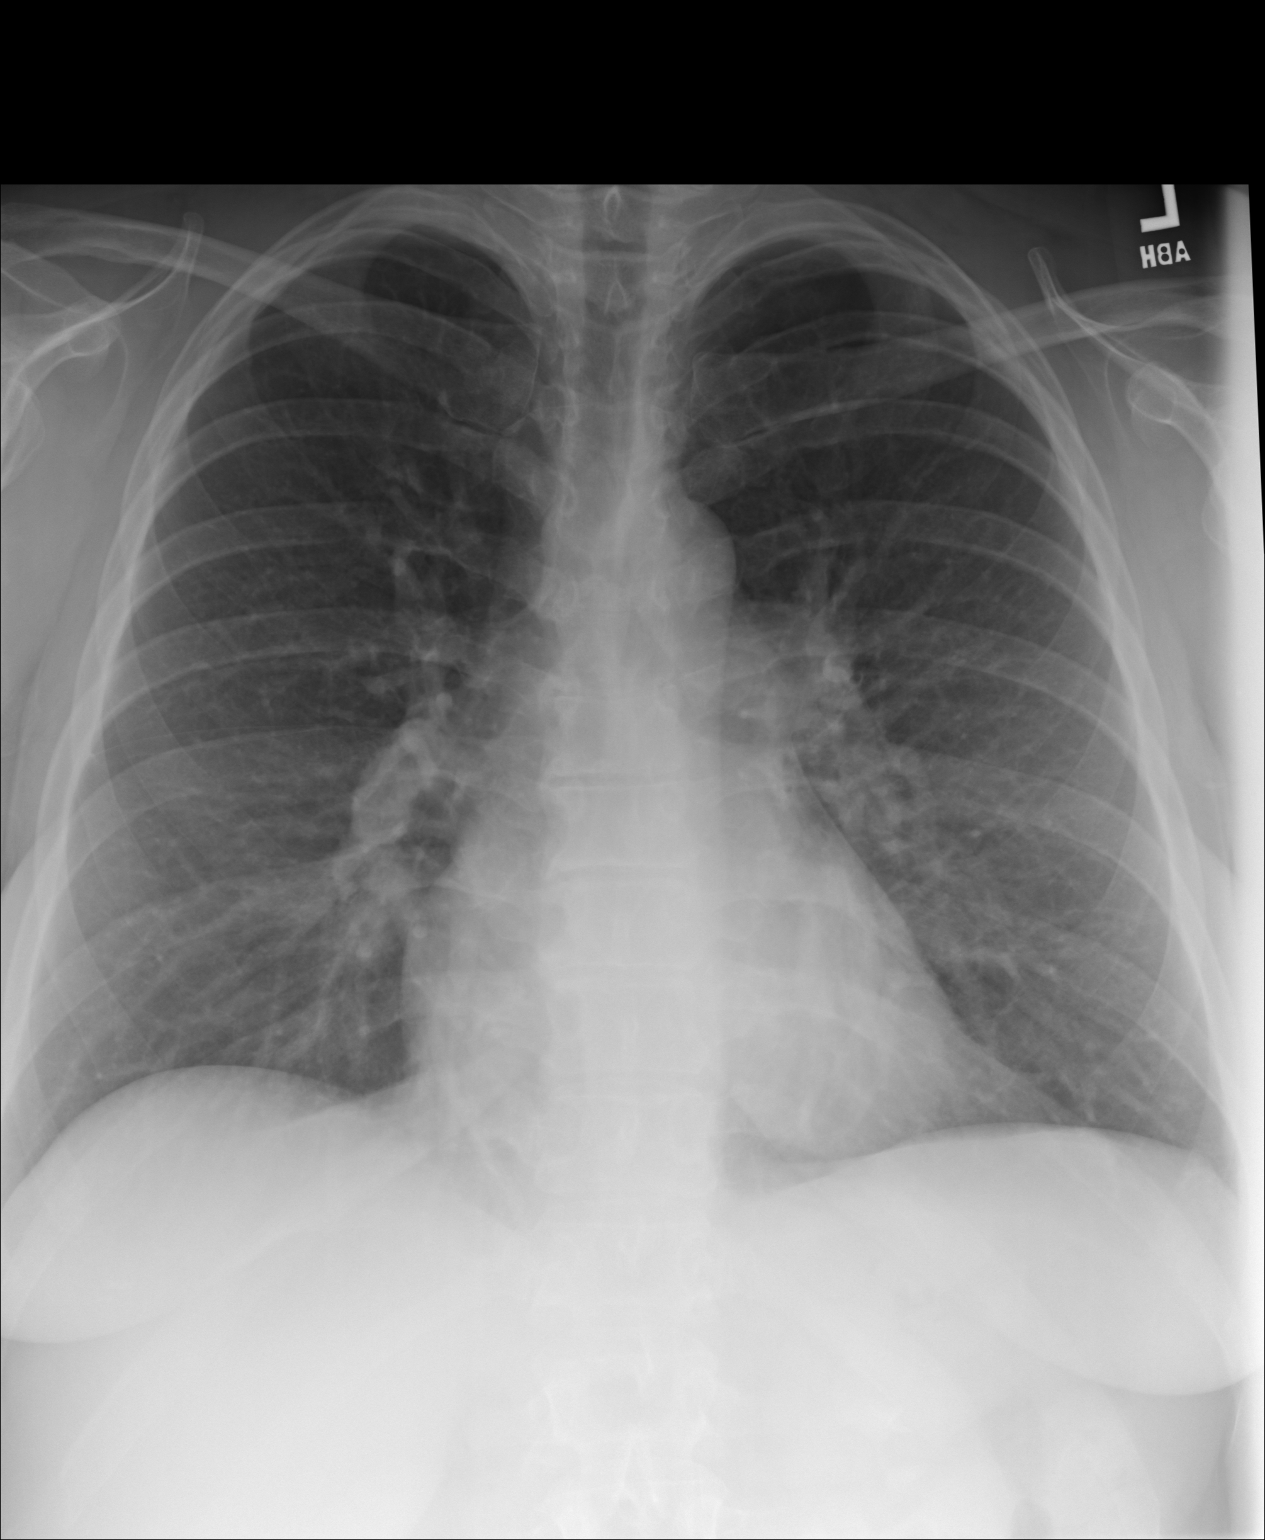
[im 2/3]
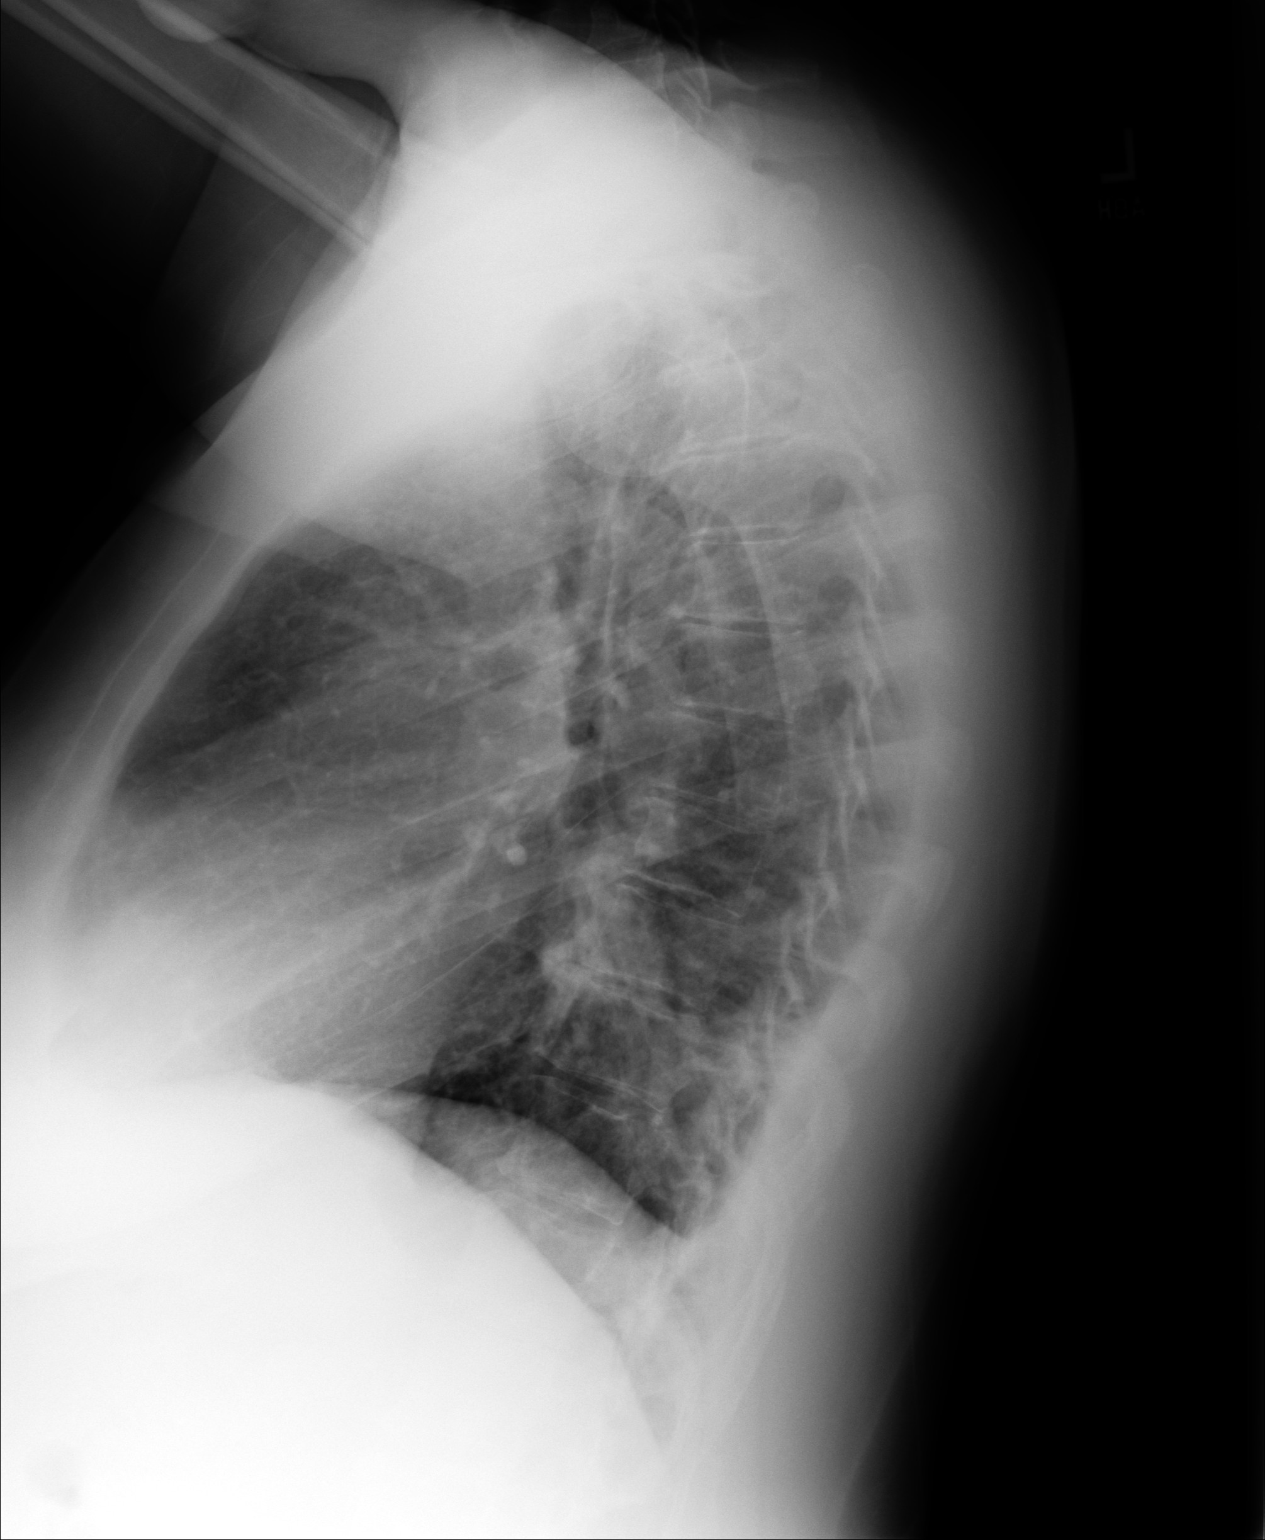
[im 3/3]
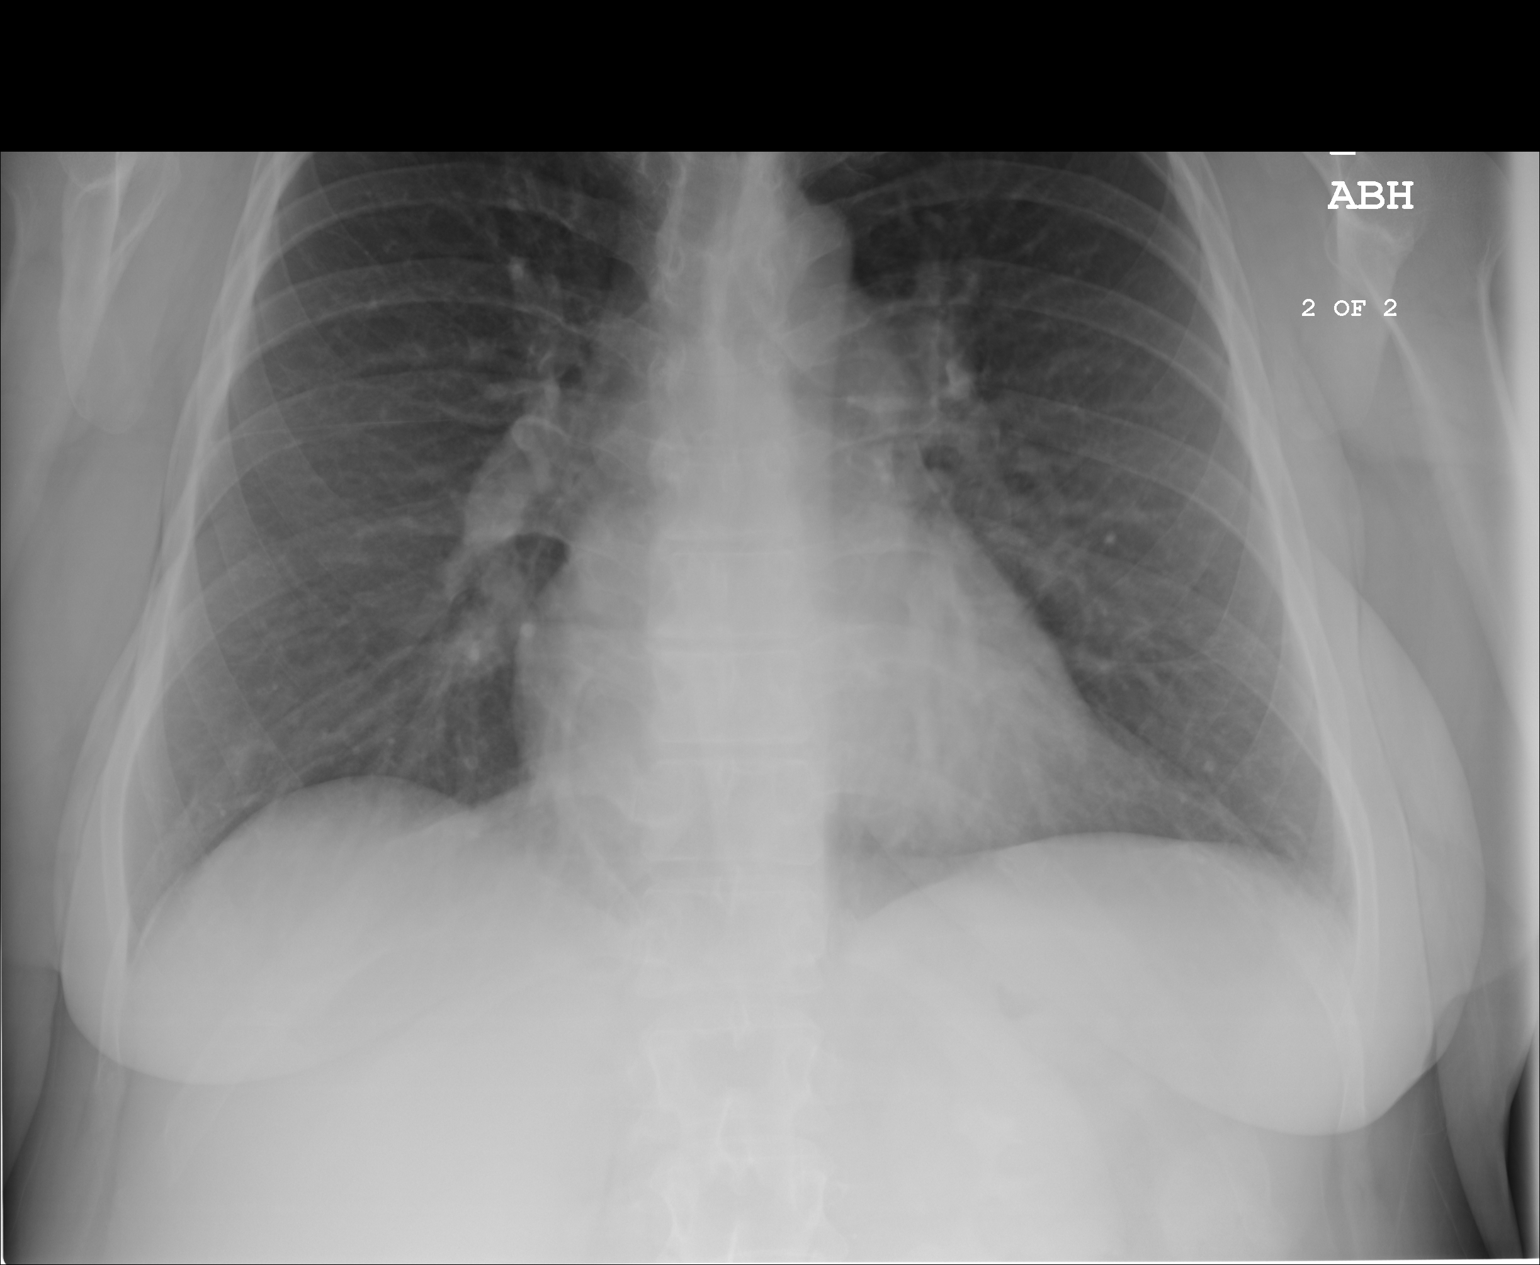

[3 of 3 positions shown; findings below may reference images not displayed]

FINDINGS: PA and lateral chest radiographs are provided.  There is no focal
parenchymal opacity, pleural effusion, or pneumothorax. The heart and
mediastinum are unremarkable.  The osseous structures are unremarkable.
IMPRESSION: No acute disease of the chest.

## 2011-01-18 DIAGNOSIS — R0789 Other chest pain: Secondary | ICD-10-CM

## 2011-01-19 ENCOUNTER — Encounter: Payer: Self-pay | Admitting: Family Medicine

## 2011-01-19 ENCOUNTER — Ambulatory Visit: Payer: BC Managed Care – PPO | Admitting: Cardiology

## 2011-01-19 DIAGNOSIS — R079 Chest pain, unspecified: Secondary | ICD-10-CM

## 2011-01-20 ENCOUNTER — Encounter: Payer: Self-pay | Admitting: Family Medicine

## 2011-01-20 ENCOUNTER — Ambulatory Visit (INDEPENDENT_AMBULATORY_CARE_PROVIDER_SITE_OTHER): Payer: BC Managed Care – PPO | Admitting: Family Medicine

## 2011-01-20 DIAGNOSIS — R079 Chest pain, unspecified: Secondary | ICD-10-CM

## 2011-01-20 DIAGNOSIS — E785 Hyperlipidemia, unspecified: Secondary | ICD-10-CM | POA: Insufficient documentation

## 2011-01-20 DIAGNOSIS — F17200 Nicotine dependence, unspecified, uncomplicated: Secondary | ICD-10-CM

## 2011-01-20 DIAGNOSIS — Z720 Tobacco use: Secondary | ICD-10-CM | POA: Insufficient documentation

## 2011-01-22 NOTE — Progress Notes (Signed)
Summary: Referral to pain management/handicapped placard  Phone Note Call from Patient Call back at Work Phone [phone]   Caller: Patient Call For: Ruthe Mannan MD Summary of Call: Patient called and stated that she is waiting on a call regarding her referral to the pain clinic and still has not heard anything.  She is afraid she will start to go through withdrawls.  Also, she would like a handicapped parking placard.  Please advise. Initial call taken by: Linde Gillis CMA Duncan Dull),  January 14, 2011 10:52 AM  Follow-up for Phone Call        Shirlee Limerick is working on this. Yes, I will sign her handicapped placard if she brings it by the office. Ruthe Mannan MD  January 14, 2011 10:54 AM  Left message on cell phone voicemail advising patient as instructed.  Will ask Shirlee Limerick to check on status of pain clinic referral.  Follow-up by: Linde Gillis CMA Duncan Dull),  January 14, 2011 11:07 AM     Appended Document: Referral to pain management/handicapped placard Spoke with Shirlee Limerick, she called [facility] and spoke with Tresa Endo.  Patient was accepted as a new patient by Dr. Joanna Puff.  New patient packet was mailed to patient today.  She must review packet first and then call there office to schedule appt.  Left message on patients cell phone voicemail advising her as instructed.  Appended Document: Referral to pain management/handicapped placard thank you.

## 2011-01-22 NOTE — Assessment & Plan Note (Signed)
Summary: BRONCHITIS/CLE  BCBS   Vital Signs:  Patient profile:   50 year old female Height:      69.75 inches Weight:      216 pounds BMI:     31.33 Temp:     99.4 degrees F oral Pulse rate:   94 / minute Pulse rhythm:   regular BP sitting:   124 / 72  (left arm) Cuff size:   large  Vitals Entered By: Linde Gillis CMA Duncan Dull) (January 15, 2011 3:15 PM) CC: ? bronchitis   History of Present Illness: 50 yo with complicated medical history, including bipolar disorder, progressive MS here for follow up cough.  bronchitis- given Zpack on 2/3, acutely worsened given 5 day course of Avelox on 01/05/2011.     No longer feels feverish and cough is no longer productive but wheezing has worsened. Still smoking. Taking Xopenex HFA as needed but feels this is no longer helping.    No CP. No increased WOB.       Current Medications (verified): 1)  Cymbalta 60 Mg Cpep (Duloxetine Hcl) .... Take One Tablet By Mouth Two Times A Day 2)  Lamotrigine 150 Mg Tabs (Lamotrigine) .... Take Two Tablets By Mouth Daily 3)  Hydrocodone-Acetaminophen 10-325 Mg Tabs (Hydrocodone-Acetaminophen) .... Take One and A Half Tablets By Mouth Four Times Daily 4)  Hydrochlorothiazide 25 Mg Tabs (Hydrochlorothiazide) .... Take One Tablet By Mouth Daily 5)  Temazepam 30 Mg Caps (Temazepam) .... Take One Tablet By Mouth Two Times A Day 6)  Hydroxyzine Hcl 25 Mg Tabs (Hydroxyzine Hcl) .Marland Kitchen.. 1 Tab By Mouth Daily. 7)  Lidoderm 5 % Ptch (Lidocaine) .... Apply Two Patches On Skin Daily. 8)  Voltaren 1 %  Gel (Diclofenac Sodium) .... Two Times A Day  To Qid As Needed 9)  Fluocinolone Acetonide 0.01 % Crea (Fluocinolone Acetonide) .... Apply To Area Two Times A Day As Needed 10)  Xopenex Hfa 45 Mcg/act Aero (Levalbuterol Tartrate) 11)  Clobetasol Propionate 0.05 % Crea (Clobetasol Propionate) .... Aoply To Area Two Times A Day As Needed. 12)  Xopenex Hfa 45 Mcg/act Aero (Levalbuterol Tartrate) .... Use As Directed. 13)   Valtrex 1 Gm Tabs (Valacyclovir Hcl) .Marland Kitchen.. 1 Tab By Mouth Three Times A Day X 7 Days 14)  Advair Diskus 100-50 Mcg/dose Misc (Fluticasone-Salmeterol) .Marland Kitchen.. 1 Puff 2 Times Daily  Allergies: 1)  ! Ceclor 2)  ! Voltaren 3)  ! Motrin 4)  ! Advil 5)  ! * Aleve 6)  ! * Abilify  Past History:  Past Medical History: Last updated: 09/08/2010 bipolar disorder multiple sclerosis Depression Osteoarthritis GERD Nephrolithiasis, hx of  Past Surgical History: Last updated: 2010/09/04 Tonsillectomy  Family History: Last updated: September 04, 2010 Dad died at 12 of sarcoma Mom- healthy  Social History: Last updated: 09/04/2010 recently moved here from [place] to help take care of her mother. lives with mother and sister. has 81 yo son in [place]. Current Smoker Alcohol use-no Drug use-no  Risk Factors: Smoking Status: current (04-Sep-2010)  Review of Systems      See HPI General:  Denies fever. CV:  Denies chest pain or discomfort. Resp:  Complains of cough and wheezing; denies shortness of breath and sputum productive.  Physical Exam  General:  alert, very talkative, seems to have diffiiculty focusing on one topic. VSS Mouth:  good dentition.   Lungs:  Normal respiratory effort, chest expands symmetrically.no crackles or ronchi. does have some exp wheezes bilaterally Heart:  Normal rate and regular rhythm.  S1 and S2 normal without gallop, murmur, click, rub or other extra sounds. Psych:  Cognition and judgment appear intact. Alert and cooperative with normal attention span and concentration. No apparent delusions, illusions, hallucinations   Impression & Recommendations:  Problem # 1:  ? of WHEEZING (ICD-786.07) Assessment Deteriorated ? if Lilee has a component of COPD. Discussed importance of smoking cessation, she is not ready to quit. Will start on Advair, pt needs formal PFTs when she is feeling better. Pt agrees with plan.  Will follow up in 1-2 weeks.    Problem # 2:  ACUTE  BRONCHITIS (ICD-466.0) Assessment: Improved do not believe this is infectious at this point.  no further abx necessary. Her updated medication list for this problem includes:    Xopenex Hfa 45 Mcg/act Aero (Levalbuterol tartrate)    Xopenex Hfa 45 Mcg/act Aero (Levalbuterol tartrate) ..... Use as directed.    Advair Diskus 100-50 Mcg/dose Misc (Fluticasone-salmeterol) .Marland Kitchen... 1 puff 2 times daily  Problem # 3:  HERPES ZOSTER (ICD-053.9) Assessment: Improved Pain is slowly improving.  Complete Medication List: 1)  Cymbalta 60 Mg Cpep (Duloxetine hcl) .... Take one tablet by mouth two times a day 2)  Lamotrigine 150 Mg Tabs (Lamotrigine) .... Take two tablets by mouth daily 3)  Hydrocodone-acetaminophen 10-325 Mg Tabs (Hydrocodone-acetaminophen) .... Take one and a half tablets by mouth four times daily 4)  Hydrochlorothiazide 25 Mg Tabs (Hydrochlorothiazide) .... Take one tablet by mouth daily 5)  Temazepam 30 Mg Caps (Temazepam) .... Take one tablet by mouth two times a day 6)  Hydroxyzine Hcl 25 Mg Tabs (Hydroxyzine hcl) .Marland Kitchen.. 1 tab by mouth daily. 7)  Lidoderm 5 % Ptch (Lidocaine) .... Apply two patches on skin daily. 8)  Voltaren 1 % Gel (Diclofenac sodium) .... Two times a day  to qid as needed 9)  Fluocinolone Acetonide 0.01 % Crea (Fluocinolone acetonide) .... Apply to area two times a day as needed 10)  Xopenex Hfa 45 Mcg/act Aero (Levalbuterol tartrate) 11)  Clobetasol Propionate 0.05 % Crea (Clobetasol propionate) .... Aoply to area two times a day as needed. 12)  Xopenex Hfa 45 Mcg/act Aero (Levalbuterol tartrate) .... Use as directed. 13)  Valtrex 1 Gm Tabs (Valacyclovir hcl) .Marland Kitchen.. 1 tab by mouth three times a day x 7 days 14)  Advair Diskus 100-50 Mcg/dose Misc (Fluticasone-salmeterol) .Marland Kitchen.. 1 puff 2 times daily  Patient Instructions: 1)  Try the Advair 2)  If it does not help, please call us next week.   Orders Added: 1)  Est. Patient Level IV [60454]    Current  Allergies (reviewed today): ! CECLOR ! VOLTAREN ! MOTRIN ! ADVIL ! * ALEVE ! * ABILIFY

## 2011-01-22 NOTE — Progress Notes (Signed)
Summary: wants referral to cardiologist   Phone Note Call from Patient Call back at Work Phone [phone]   Caller: Patient Call For: Emily Mannan MD Summary of Call: Patient is asking for a referral to see cardiologist (wants to be seen ASAP). She says that she is still having some shortness of breath, and just dosn't feel right. She says that she feels very tired and her heart is pounding out of her chest.  Initial call taken by: Melody Comas,  January 16, 2011 9:01 AM  Follow-up for Phone Call        We have not done an EKG in our office or any type of cardiac work up.  If she prefers a cardiologist, I can try to refer but not sure what they will say. Emily Mannan MD  January 16, 2011 10:12 AM   Additional Follow-up for Phone Call Additional follow up Details #1::        Cardiology appt made with Dr Shirlee Latch on 01/19/2011 at 4pm. Called # listed and had to leave a VM for the pt. Additional Follow-up by: Carlton Adam,  January 16, 2011 11:18 AM  New Problems: PALPITATIONS (ICD-785.1)   New Problems: PALPITATIONS (ICD-785.1)

## 2011-01-27 ENCOUNTER — Encounter: Payer: Self-pay | Admitting: Family Medicine

## 2011-01-27 LAB — HM PAP SMEAR

## 2011-01-27 NOTE — Assessment & Plan Note (Signed)
Summary: [facility] follow up/nt   Vital Signs:  Patient profile:   50 year old female Height:      69.75 inches Weight:      214.25 pounds BMI:     31.07 Temp:     99.5 degrees F oral Pulse rate:   90 / minute Pulse rhythm:   regular BP sitting:   120 / 78  (left arm) Cuff size:   large  Vitals Entered By: Linde Gillis CMA Duncan Dull) (January 20, 2011 10:16 AM) CC: hospital follow up   History of Present Illness: 50 yo here for [facility] follow up.  Notes reviewed.  Went to [facility] on 01/18/2011 for CP that radiated to her jaw.  Associated with some dizziness, no sycope.  Had atypical and typical features. CT of chest neg for PE.  CE neg x 3. Cholesterol elevated- ldl 189, tg 288.  Cards consulted. Per pt, had neg treadmill stress test yesterday.  No futher chest pain. She was given Pravachol 20 mg daily and advised to quit smoking.   Current Medications (verified): 1)  Cymbalta 60 Mg Cpep (Duloxetine Hcl) .... Take One Tablet By Mouth Two Times A Day 2)  Lamotrigine 150 Mg Tabs (Lamotrigine) .... Take Two Tablets By Mouth Daily 3)  Hydrocodone-Acetaminophen 10-325 Mg Tabs (Hydrocodone-Acetaminophen) .... Take One and A Half Tablets By Mouth Four Times Daily 4)  Hydrochlorothiazide 25 Mg Tabs (Hydrochlorothiazide) .... Take One Tablet By Mouth Daily 5)  Temazepam 30 Mg Caps (Temazepam) .... Take One Tablet By Mouth Two Times A Day 6)  Hydroxyzine Hcl 25 Mg Tabs (Hydroxyzine Hcl) .Marland Kitchen.. 1 Tab By Mouth Daily. 7)  Lidoderm 5 % Ptch (Lidocaine) .... Apply Two Patches On Skin Daily. 8)  Voltaren 1 %  Gel (Diclofenac Sodium) .... Two Times A Day  To Qid As Needed 9)  Fluocinolone Acetonide 0.01 % Crea (Fluocinolone Acetonide) .... Apply To Area Two Times A Day As Needed 10)  Xopenex Hfa 45 Mcg/act Aero (Levalbuterol Tartrate) 11)  Clobetasol Propionate 0.05 % Crea (Clobetasol Propionate) .... Aoply To Area Two Times A Day As Needed. 12)  Xopenex Hfa 45 Mcg/act Aero (Levalbuterol Tartrate) .... Use  As Directed. 13)  Valtrex 1 Gm Tabs (Valacyclovir Hcl) .Marland Kitchen.. 1 Tab By Mouth Three Times A Day X 7 Days 14)  Advair Diskus 100-50 Mcg/dose Misc (Fluticasone-Salmeterol) .Marland Kitchen.. 1 Puff 2 Times Daily  Allergies: 1)  ! Ceclor 2)  ! Voltaren 3)  ! Motrin 4)  ! Advil 5)  ! * Aleve 6)  ! * Abilify  Past History:  Past Medical History: Last updated: 09/08/2010 bipolar disorder multiple sclerosis Depression Osteoarthritis GERD Nephrolithiasis, hx of  Past Surgical History: Last updated: 04-Sep-2010 Tonsillectomy  Family History: Last updated: 09/04/2010 Dad died at 49 of sarcoma Mom- healthy  Social History: Last updated: 09/04/2010 recently moved here from [place] to help take care of her mother. lives with mother and sister. has 36 yo son in [place]. Current Smoker Alcohol use-no Drug use-no  Risk Factors: Smoking Status: current (09-04-10)  Review of Systems      See HPI CV:  Denies chest pain or discomfort. Resp:  Denies shortness of breath.  Physical Exam  General:  alert, very talkative, seems to have diffiiculty focusing on one topic. VSS Psych:  Cognition and judgment appear intact. Alert and cooperative with normal attention span and concentration. No apparent delusions, illusions, hallucinations   Impression & Recommendations:  Problem # 1:  CHEST PAIN UNSPECIFIED (ICD-786.50) Assessment  New Typical and atypical, resolved. High risk for CAD given that she is a smoker and has elevated lipids. Time spent with patient 25 minutes, more than 50% of this time was spent counseling patient on risk reduction.  See below.  Problem # 2:  HYPERLIPIDEMIA (ICD-272.4)  Agrees to start Pravastatin 20 mg  after our discussion. Discussed cholesterol friendly diet as well.  Her updated medication list for this problem includes:    Pravastatin Sodium 20 Mg Tabs (Pravastatin sodium) ..... One by mouth at bedtime  Problem # 3:  SMOKER (ICD-305.1) Assessment:  Unchanged discussed that smoking cessation is the most important risk factor we need to eliminate. She cannot tolerate Chantix and is willing to try gums and patches.  Complete Medication List: 1)  Cymbalta 60 Mg Cpep (Duloxetine hcl) .... Take one tablet by mouth two times a day 2)  Lamotrigine 150 Mg Tabs (Lamotrigine) .... Take two tablets by mouth daily 3)  Hydrocodone-acetaminophen 10-325 Mg Tabs (Hydrocodone-acetaminophen) .... Take one and a half tablets by mouth four times daily 4)  Hydrochlorothiazide 25 Mg Tabs (Hydrochlorothiazide) .... Take one tablet by mouth daily 5)  Temazepam 30 Mg Caps (Temazepam) .... Take one tablet by mouth two times a day 6)  Hydroxyzine Hcl 25 Mg Tabs (Hydroxyzine hcl) .Marland Kitchen.. 1 tab by mouth daily. 7)  Lidoderm 5 % Ptch (Lidocaine) .... Apply two patches on skin daily. 8)  Voltaren 1 % Gel (Diclofenac sodium) .... Two times a day  to qid as needed 9)  Fluocinolone Acetonide 0.01 % Crea (Fluocinolone acetonide) .... Apply to area two times a day as needed 10)  Xopenex Hfa 45 Mcg/act Aero (Levalbuterol tartrate) 11)  Clobetasol Propionate 0.05 % Crea (Clobetasol propionate) .... Aoply to area two times a day as needed. 12)  Xopenex Hfa 45 Mcg/act Aero (Levalbuterol tartrate) .... Use as directed. 13)  Valtrex 1 Gm Tabs (Valacyclovir hcl) .Marland Kitchen.. 1 tab by mouth three times a day x 7 days 14)  Advair Diskus 100-50 Mcg/dose Misc (Fluticasone-salmeterol) .Marland Kitchen.. 1 puff 2 times daily 15)  Pravastatin Sodium 20 Mg Tabs (Pravastatin sodium) .... One by mouth at bedtime Prescriptions: PRAVASTATIN SODIUM 20 MG  TABS (PRAVASTATIN SODIUM) one by mouth at bedtime  #90 x 3   Entered and Authorized by:   Ruthe Mannan MD   Signed by:   Ruthe Mannan MD on 01/20/2011   Method used:   Electronically to        [facility]* (retail)       [address]       [place], [place]  [address]       Ph: [phone]       Fax: [phone]   RxID:   [id number]    Orders Added: 1)   Est. Patient Level IV [40102]    Current Allergies (reviewed today): ! CECLOR ! VOLTAREN ! MOTRIN ! ADVIL ! * ALEVE ! * ABILIFY

## 2011-01-27 NOTE — Letter (Signed)
Summary: Out of Work  [facility] at [facility]  [address] [place], [place] [address]   Phone: [phone]  Fax: [phone]    January 20, 2011   Employee:  RINA ADNEY    To Whom It May Concern:   Ms. Pester may return to work tomorrow, January 21, 2011.  If you need additional information, please feel free to contact our office.         Sincerely,    Ruthe Mannan MD

## 2011-01-27 NOTE — Letter (Signed)
Summary: [facility]   [facility]   Imported By: Kassie Mends 01/19/2011 11:08:21  _____________________________________________________________________  External Attachment:    Type:   Image     Comment:   External Document

## 2011-01-28 ENCOUNTER — Ambulatory Visit: Payer: Self-pay | Admitting: Pain Medicine

## 2011-01-28 ENCOUNTER — Telehealth: Payer: Self-pay | Admitting: Family Medicine

## 2011-02-03 ENCOUNTER — Telehealth: Payer: Self-pay | Admitting: *Deleted

## 2011-02-03 ENCOUNTER — Other Ambulatory Visit: Payer: Self-pay | Admitting: *Deleted

## 2011-02-03 MED ORDER — TEMAZEPAM 30 MG PO CAPS: 30.0000 mg | ORAL_CAPSULE | Freq: Two times a day (BID) | ORAL | Status: DC

## 2011-02-03 NOTE — Progress Notes (Signed)
Summary: wants phone call   Phone Note Call from Patient Call back at Work Phone [phone]   Caller: Patient Call For: Emily Moon Summary of Call: Patient is asking that you call her. I asked her what it was regarding, but she wouldn't tell me anything. She just kept saying that she needed to speak with you .  Initial call taken by: Melody Comas,  January 28, 2011 10:41 AM  Follow-up for Phone Call        Patient called to give [place] an update about the pain clinic.  She stated that they are not giving her any medication but they are making suggestions about other ways to control her pain.  She will have several xrays done and they also did a background check on her.  Linde Gillis CMA Duncan Dull)  January 28, 2011 11:02 AM   Additional Follow-up for Phone Call Additional follow up Details #1::        I will await their notes but I cannot give her more narcotics at this point.  We need to defer to pain clinic to manage her pain, with or without narcotics. Emily Moon  January 28, 2011 11:04 AM

## 2011-02-03 NOTE — Progress Notes (Signed)
  Phone Note From Other Clinic   Caller: Receptionist Summary of Call: [facility] Dr Joanna Puff patient  has an appt today 01/28/2011 at 8:30am. Evaluation only no narcotics will be prescribed at this appt.  Initial call taken by: Carlton Adam,  January 28, 2011 9:24 AM  Follow-up for Phone Call        Thank you. Ruthe Mannan MD  January 28, 2011 9:26 AM

## 2011-02-03 NOTE — Letter (Signed)
Summary: [facility]  [facility]   Imported By: Kassie Mends 01/27/2011 08:14:43  _____________________________________________________________________  External Attachment:    Type:   Image     Comment:   External Document

## 2011-02-03 NOTE — Telephone Encounter (Signed)
Refill request from [facility]. [phone]

## 2011-02-03 NOTE — Telephone Encounter (Signed)
Rx phoned to pharmacy.  

## 2011-02-04 NOTE — Telephone Encounter (Signed)
Entered in error

## 2011-02-05 ENCOUNTER — Other Ambulatory Visit: Payer: Self-pay | Admitting: Family Medicine

## 2011-02-05 ENCOUNTER — Encounter: Payer: Self-pay | Admitting: Family Medicine

## 2011-02-05 ENCOUNTER — Ambulatory Visit: Payer: BC Managed Care – PPO | Admitting: Family Medicine

## 2011-02-05 ENCOUNTER — Other Ambulatory Visit (HOSPITAL_COMMUNITY)
Admission: RE | Admit: 2011-02-05 | Discharge: 2011-02-05 | Disposition: A | Discharge status: Home / Self Care | Payer: BC Managed Care – PPO | Source: Ambulatory Visit | Attending: Family Medicine | Admitting: Family Medicine

## 2011-02-05 ENCOUNTER — Ambulatory Visit (INDEPENDENT_AMBULATORY_CARE_PROVIDER_SITE_OTHER): Payer: BC Managed Care – PPO | Admitting: Family Medicine

## 2011-02-05 DIAGNOSIS — G894 Chronic pain syndrome: Secondary | ICD-10-CM

## 2011-02-05 DIAGNOSIS — Z1239 Encounter for other screening for malignant neoplasm of breast: Secondary | ICD-10-CM

## 2011-02-05 DIAGNOSIS — Z01419 Encounter for gynecological examination (general) (routine) without abnormal findings: Secondary | ICD-10-CM | POA: Insufficient documentation

## 2011-02-05 DIAGNOSIS — R87810 Cervical high risk human papillomavirus (HPV) DNA test positive: Secondary | ICD-10-CM | POA: Insufficient documentation

## 2011-02-05 DIAGNOSIS — N951 Menopausal and female climacteric states: Secondary | ICD-10-CM | POA: Insufficient documentation

## 2011-02-05 DIAGNOSIS — N949 Unspecified condition associated with female genital organs and menstrual cycle: Secondary | ICD-10-CM

## 2011-02-05 DIAGNOSIS — R102 Pelvic and perineal pain: Secondary | ICD-10-CM

## 2011-02-05 LAB — BASIC METABOLIC PANEL
BUN: 9 mg/dL (ref 6–23)
CO2: 30 mEq/L (ref 19–32)
Calcium: 8.7 mg/dL (ref 8.4–10.5)
Creatinine, Ser: 0.7 mg/dL (ref 0.4–1.2)

## 2011-02-05 LAB — LUTEINIZING HORMONE: LH: 3.82 m[IU]/mL

## 2011-02-05 NOTE — Progress Notes (Signed)
Subjective:    Patient ID: Emily Moon, female    DOB: Sep 15, 1961, 50 y.o.   MRN: [id number]  HPI  50 yo with complicated medical history, including bipolar disorder, progressive MS here for CPX.  G5P1, perimenopausal. Often had pelvic pain, even when she is not menstruating. Not currently sexually active. Has not had a pap smear or mammogram in several years. No family h/o cervical, ovarian, or breast CA.  Chronic pain- referred to pain clinic.  They will not prescribe meds per pt until after her xrays and psychiatric evaluations are complete.  She is following up again with them in May.  BP 122/80  Pulse 105  Temp(Src) 99.3 F (37.4 C) (Oral)  Ht 5' 9.5" (1.765 m)  Wt 216 lb (97.977 kg)  BMI 31.44 kg/m2  Past Medical History  Diagnosis Date  . Bipolar disorder   . Multiple sclerosis   . Depression   . GERD (gastroesophageal reflux disease)   . Nephrolithiasis   . Osteoporosis     osteoarthritis  . Fibromyalgia syndrome     Past Surgical History  Procedure Date  . Tonsillectomy     History   Social History  . Marital Status: Divorced    Spouse Name: N/A    Number of Children: 1  . Years of Education: N/A   Occupational History  . 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 Smoking status: Current Everyday Smoker  . Smokeless tobacco: Not on file  . Alcohol Use: No  . Drug Use: No  . Sexually Active:    Other Topics Concern  . Not on file   Social History Narrative  . No narrative on file    Family History  Problem Relation Age of Onset  . Cancer Father     Allergies  Allergen Reactions  . Aripiprazole   . Cefaclor   . Diclofenac Sodium   . Ibuprofen   . Naproxen Sodium     Current Outpatient Prescriptions on File Prior to Visit  Medication Sig Dispense Refill  . clobetasol (TEMOVATE) 0.05 % cream Apply 1 application topically 2 (two) times daily.        . diclofenac sodium (VOLTAREN) 1 % GEL One  application two to four times daily as needed       . DULoxetine (CYMBALTA) 60 MG capsule Take 60 mg by mouth 2 (two) times daily.        . fluocinolone (VANOS) 0.01 % cream Apply topically 2 (two) times daily.        . Fluticasone-Salmeterol (ADVAIR DISKUS) 100-50 MCG/DOSE AEPB Inhale 1 puff into the lungs 2 (two) times daily.        . hydrochlorothiazide 25 MG tablet Take 25 mg by mouth daily.        Marland Kitchen HYDROcodone-acetaminophen (NORCO) 10-325 MG per tablet Take one and one half tablets by mouth four times daily       . hydrOXYzine (ATARAX) 25 MG tablet Take 25 mg by mouth daily.        Marland Kitchen lamoTRIgine (LAMICTAL) 150 MG tablet Take two tablets by mouth daily       . levalbuterol (XOPENEX HFA) 45 MCG/ACT inhaler Inhale 1-2 puffs into the lungs every 4 (four) hours as needed.        . lidocaine (LIDODERM) 5 % Place 2 patches onto the skin daily. Remove & Discard patch within 12 hours or as directed by MD       .  pravastatin (PRAVACHOL) 20 MG tablet Take 20 mg by mouth at bedtime.        . temazepam (RESTORIL) 30 MG capsule Take 1 capsule (30 mg total) by mouth 2 (two) times daily.  60 capsule  0        Review of Systems General: Denies fever, chills, sweats. No significant weight loss. Eyes: Denies blurring,significant itching ENT: Denies earache, sore throat, and hoarseness.  Cardiovascular: Denies chest pains, palpitations, dyspnea on exertion,  Respiratory: Denies cough, dyspnea at rest,wheeezing Breast: no concerns about lumps GI: Denies nausea, vomiting, diarrhea, constipation, change in bowel habits, abdominal pain, melena, hematochezia GU: Denies dysuria, hematuria, urinary hesitancy, nocturia, denies STD risk, no concerns about discharge Musculoskeletal: Denies back pain, joint pain Derm: Denies rash, itching Neuro: Denies  paresthesias, frequent falls, frequent headaches Psych: Denies depression, anxiety Endocrine: Denies cold intolerance, heat intolerance, polydipsia Heme:  Denies enlarged lymph nodes Allergy: No hayfever     Objective:   Physical Exam BP 122/80  Pulse 105  Temp(Src) 99.3 F (37.4 C) (Oral)  Ht 5' 9.5" (1.765 m)  Wt 216 lb (97.977 kg)  BMI 31.44 kg/m2  General Appearance:    Alert, cooperative, no distress, appears stated age  Head:    Normocephalic, without obvious abnormality, atraumatic  Eyes:    PERRL, conjunctiva/corneas clear, EOM's intact, fundi    benign, both eyes  Ears:    Normal TM's and external ear canals, both ears  Nose:   Nares normal, septum midline, mucosa normal, no drainage    or sinus tenderness  Throat:   Lips, mucosa, and tongue normal; teeth and gums normal  Neck:   Supple, symmetrical, trachea midline, no adenopathy;    thyroid:  no enlargement/tenderness/nodules; no carotid   bruit or JVD  Back:     Symmetric, no curvature, ROM normal, no CVA tenderness  Lungs:     Clear to auscultation bilaterally, respirations unlabored  Chest Wall:    No tenderness or deformity   Heart:    Regular rate and rhythm, S1 and S2 normal, no murmur, rub   or gallop  Breast Exam:    No tenderness, masses, or nipple abnormality  Abdomen:     Soft, non-tender, bowel sounds active all four quadrants,    no masses, no organomegaly  Genitalia:    Normal female without lesion, discharge or tenderness  Rectal:    Normal tone, normal prostate, no masses or tenderness;   guaiac negative stool  Extremities:   Extremities normal, atraumatic, no cyanosis or edema  Pulses:   2+ and symmetric all extremities  Skin:   Skin color, texture, turgor normal, no rashes or lesions  Lymph nodes:   Cervical, supraclavicular, and axillary nodes normal  Neurologic:   CNII-XII intact, normal strength, sensation and reflexes    throughout          Assessment & Plan:

## 2011-02-05 NOTE — Assessment & Plan Note (Signed)
With pelvic pain. Will refer for pelvic ultrasound to rule out uterine fibroids, etc. FSH, LH today.

## 2011-02-05 NOTE — Patient Instructions (Signed)
Good to see you. Please stop by to see Emily Moon on your way out. 

## 2011-02-05 NOTE — Assessment & Plan Note (Signed)
Unchanged.  Awaiting records from Pain clinic.  I will continue to prescribe narcotics until May 2012.

## 2011-02-05 NOTE — Assessment & Plan Note (Signed)
Reviewed preventive care protocols, scheduled due services, and updated immunizations Discussed nutrition, exercise, diet, and healthy lifestyle.  Mammogram referral.

## 2011-02-09 ENCOUNTER — Telehealth: Payer: Self-pay | Admitting: *Deleted

## 2011-02-09 NOTE — Telephone Encounter (Signed)
Patient states that her left ankle is swollen.  She also has a spot on her right buttock that she would like for you to look at.  Please advise.  Would like an appt for Thursday afternoon if you need to see her.

## 2011-02-10 ENCOUNTER — Ambulatory Visit: Payer: BC Managed Care – PPO | Admitting: Family Medicine

## 2011-02-10 NOTE — Telephone Encounter (Signed)
Left message on cell phone voicemail advising patient that I scheduled her to see Dr. Dayton Martes on Thursday at 2:00pm.

## 2011-02-10 NOTE — Telephone Encounter (Signed)
Please put her in the 2:00 spot on Thursday, full 30 min. Thanks.

## 2011-02-11 ENCOUNTER — Ambulatory Visit (INDEPENDENT_AMBULATORY_CARE_PROVIDER_SITE_OTHER): Payer: BC Managed Care – PPO | Admitting: Psychology

## 2011-02-11 DIAGNOSIS — F319 Bipolar disorder, unspecified: Secondary | ICD-10-CM

## 2011-02-12 ENCOUNTER — Ambulatory Visit (INDEPENDENT_AMBULATORY_CARE_PROVIDER_SITE_OTHER): Payer: BC Managed Care – PPO | Admitting: Psychology

## 2011-02-12 ENCOUNTER — Ambulatory Visit: Payer: Self-pay | Admitting: Family Medicine

## 2011-02-12 ENCOUNTER — Ambulatory Visit (INDEPENDENT_AMBULATORY_CARE_PROVIDER_SITE_OTHER): Payer: BC Managed Care – PPO | Admitting: Family Medicine

## 2011-02-12 ENCOUNTER — Encounter: Payer: Self-pay | Admitting: *Deleted

## 2011-02-12 ENCOUNTER — Other Ambulatory Visit: Payer: Self-pay | Admitting: Family Medicine

## 2011-02-12 ENCOUNTER — Telehealth: Payer: Self-pay | Admitting: Family Medicine

## 2011-02-12 ENCOUNTER — Other Ambulatory Visit: Payer: Self-pay | Admitting: *Deleted

## 2011-02-12 ENCOUNTER — Encounter: Payer: Self-pay | Admitting: Family Medicine

## 2011-02-12 DIAGNOSIS — F3289 Other specified depressive episodes: Secondary | ICD-10-CM

## 2011-02-12 DIAGNOSIS — F329 Major depressive disorder, single episode, unspecified: Secondary | ICD-10-CM

## 2011-02-12 DIAGNOSIS — G35 Multiple sclerosis: Secondary | ICD-10-CM

## 2011-02-12 DIAGNOSIS — F319 Bipolar disorder, unspecified: Secondary | ICD-10-CM

## 2011-02-12 DIAGNOSIS — R935 Abnormal findings on diagnostic imaging of other abdominal regions, including retroperitoneum: Secondary | ICD-10-CM

## 2011-02-12 DIAGNOSIS — E785 Hyperlipidemia, unspecified: Secondary | ICD-10-CM

## 2011-02-12 IMAGING — US TRANSABDOMINAL ULTRASOUND OF PELVIS
1 series · 17 of 25 positions shown · non-contrast
Comparison: none

REASON FOR EXAM: pelvic pain
COMMENTS:

[Series 1: transabdominal ultrasound of pelvis · 17 of 96 slices shown]
[im 1/96]
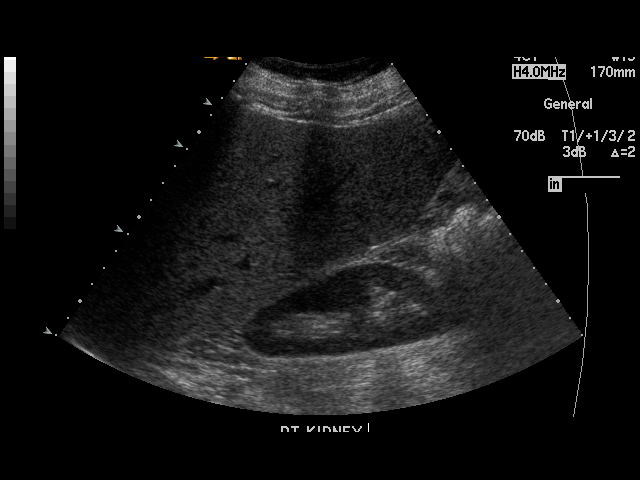
[im 8/96]
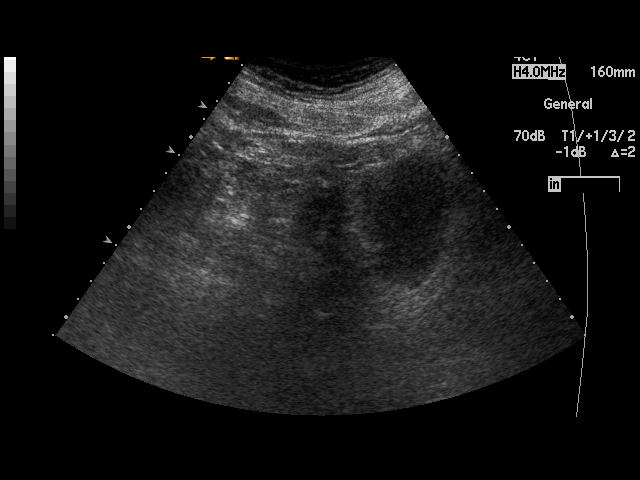
[im 12/96]
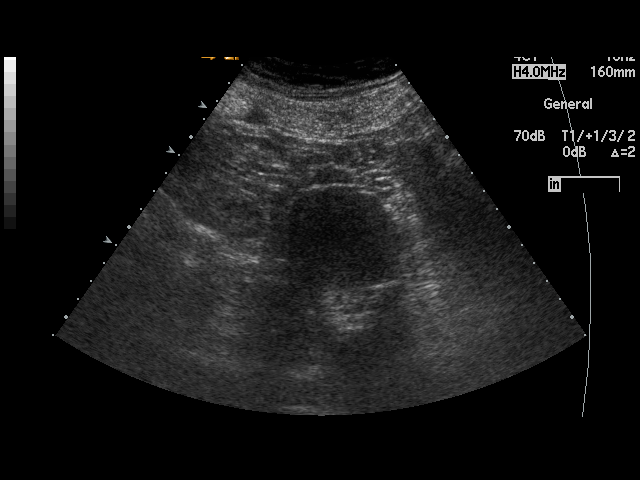
[im 20/96]
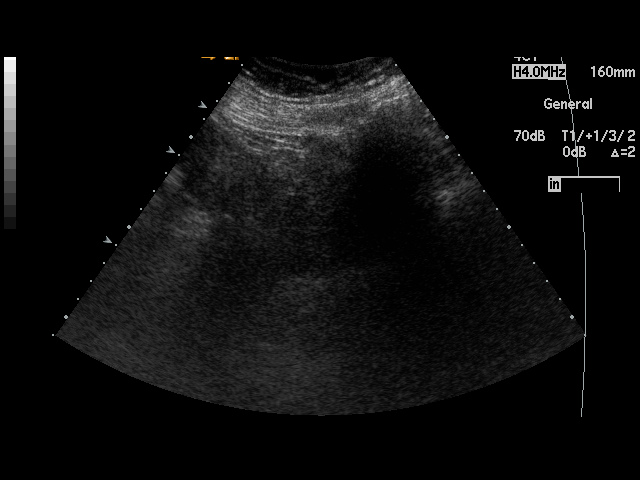
[im 24/96]
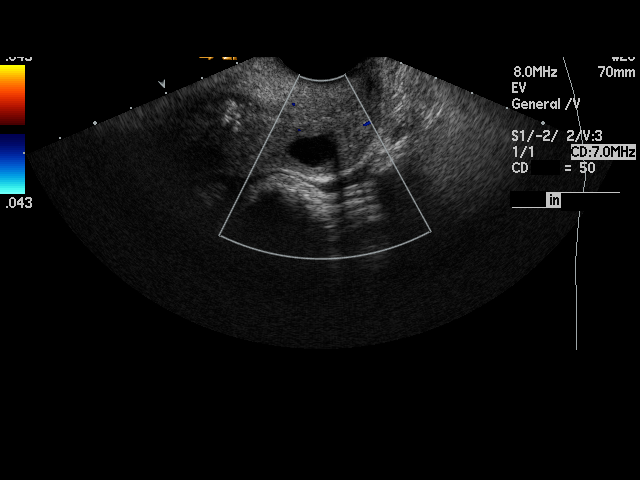
[im 32/96]
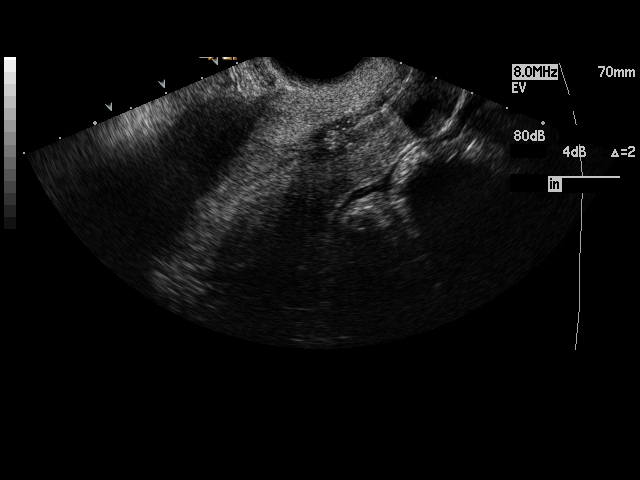
[im 36/96]
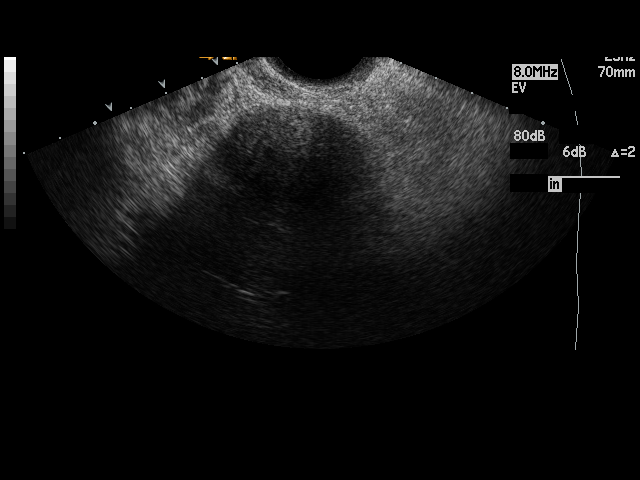
[im 44/96]
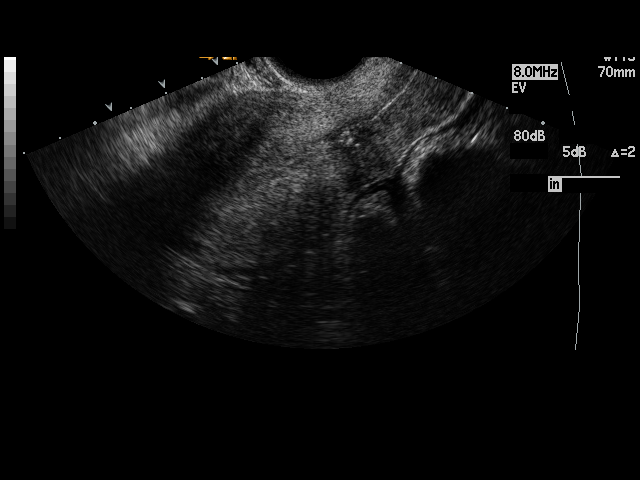
[im 48/96]
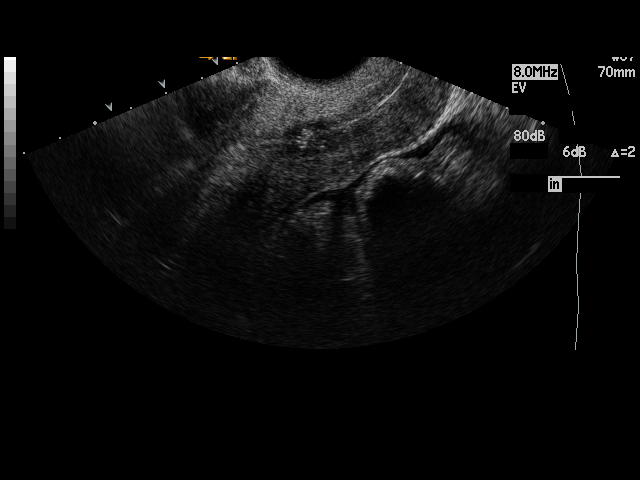
[im 52/96]
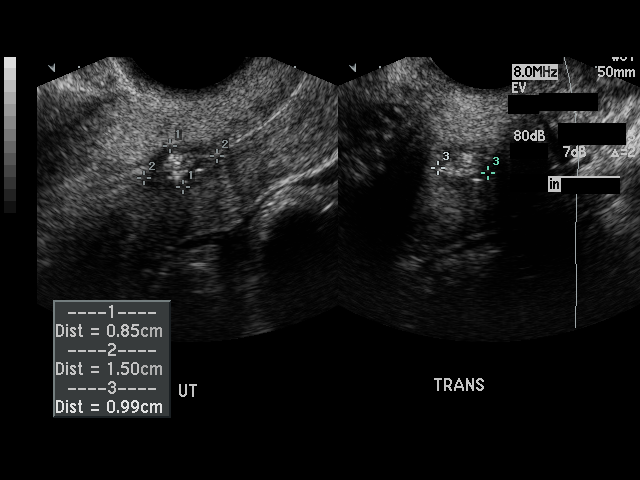
[im 60/96]
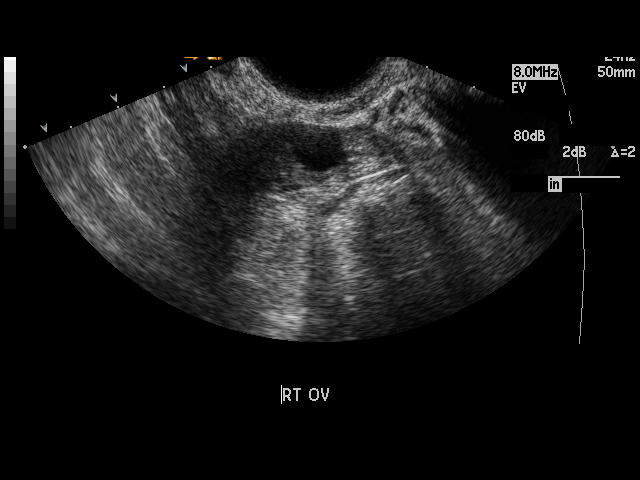
[im 64/96]
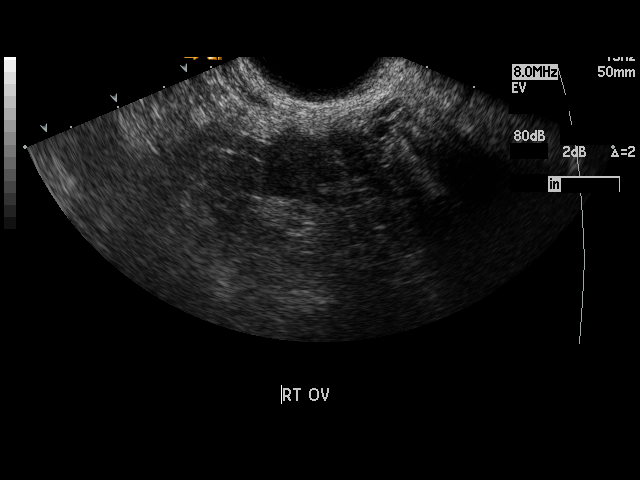
[im 72/96]
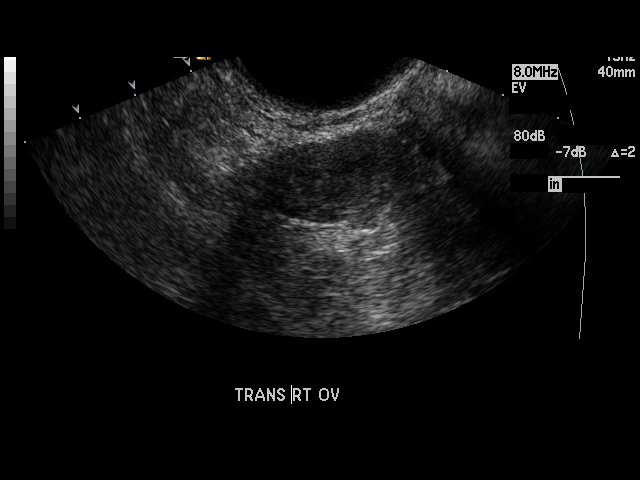
[im 76/96]
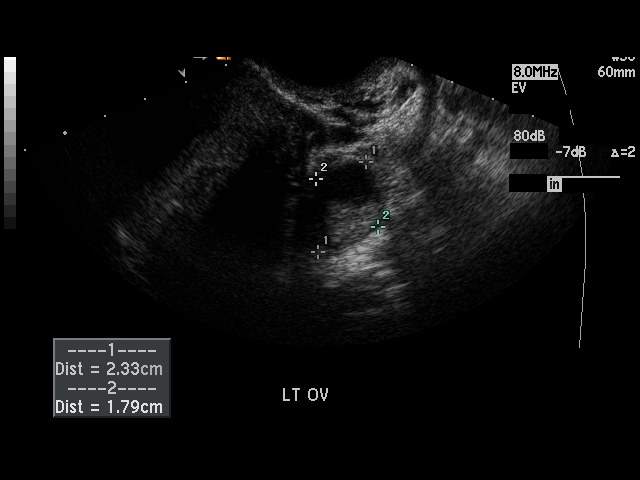
[im 84/96]
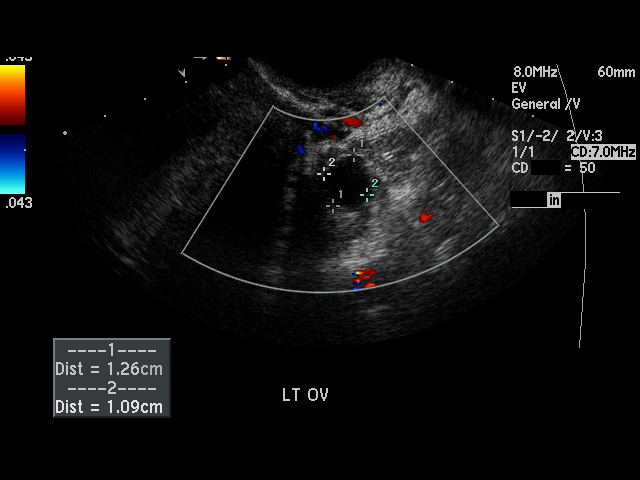
[im 88/96]
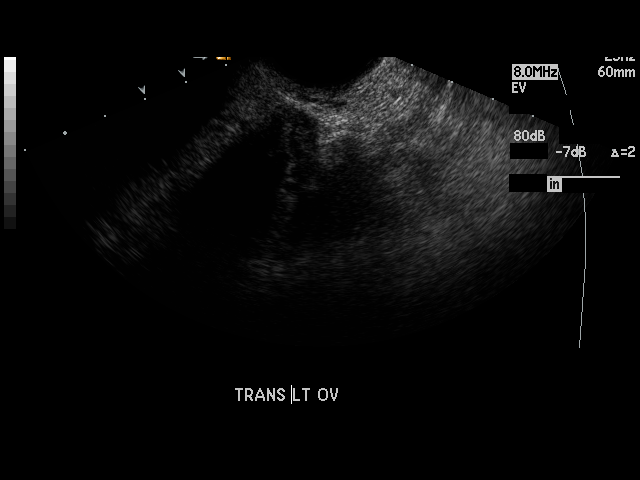
[im 96/96]
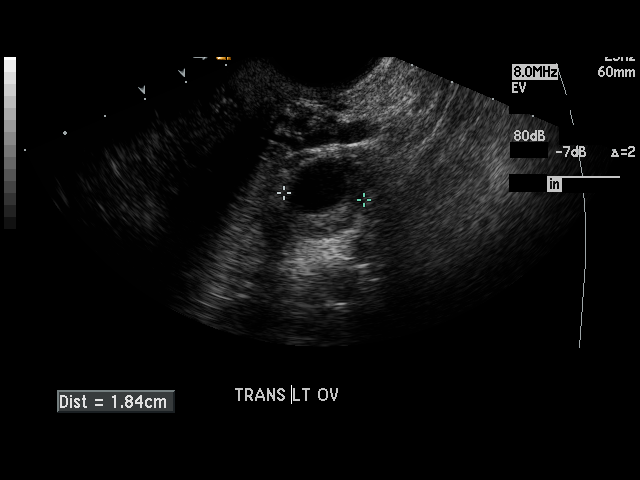

[17 of 25 positions shown; findings below may reference images not displayed]

PROCEDURE:     FATU - FATU PELVIS NON-OB W/TRANSVAGINAL  - [DATE] [DATE]

RESULT:     Pelvic sonogram is performed utilizing transabdominal and
endovaginal scanning. The study demonstrates the uterus measures 8.51 x
x 5.54 cm. The endometrium has a thickness of 5.9 mm. There is no free
fluid. There is a cystic area in the left ovary measuring 1.26 x 1.09 x
cm with a cyst in the right ovary measuring 1.04 x 0.87 x 1.10 cm. These
could represent follicles or small cysts. There is minimal fluid in the
cul-de-sac. Within the lower uterine segment region there is an area
measuring 8.5 x 15.0 x 9.9 mm with peripheral slightly hypoechoic appearance
and central increased echogenicity which is of uncertain significance.
Gynecologic followup is suggested.
IMPRESSION: Abnormal appearance in the lower uterine segment in the endometrium.
Bilateral cystic areas in the ovaries which may represent small follicles or
ovarian cysts.

## 2011-02-12 MED ORDER — PRAVASTATIN SODIUM 20 MG PO TABS: 20.0000 mg | ORAL_TABLET | Freq: Every day | ORAL | Status: DC

## 2011-02-12 NOTE — Telephone Encounter (Signed)
Ultrasound results received- pt has slightly hypoechoic appearance and central increased echogenicity of lower uterine segment.  Recommended GYN referral. Will discuss with pt when she returns call.

## 2011-02-12 NOTE — Progress Notes (Signed)
  Subjective:    Patient ID: Emily Moon, female    DOB: 1960/11/19, 50 y.o.   MRN: [id number]  HPI 50 yo with complicated medical history, including bipolar disorder, progressive MS here to discuss depression.  Stressors at work are worsening.  Feels a lot of pressure to perform her job but feels her MS is slowing her down and worries that she will loose her job. Chronic pain, referred to Pain Clinic where they ordered xrays and psych referral. Ms. Rybka saw the psychologist, Dr. Gillett Nation,  earlier this week and is here to discuss their conversation. He saw her as a work in because she was having suicidal thoughts. No longer having those thoughts now and she is following up with him tomorrow.  Admits to episodes of mania in last several months- spends a lot of money and then feels guilty.  Does hoard things although that has improved since she moved here.  She feels that she just cannot keep up with her job and she must keep her job to pay for her medical bills and her son's education.  The PMH, PSH, Social History, Family History, Medications, and allergies have been reviewed in [facility], and have been updated if relevant.    Review of Systems    Review of Systems       See HPI Objective:   Physical Exam BP 130/80  Pulse 60  Temp(Src) 99.2 F (37.3 C) (Oral)  Ht 5' 9.75" (1.772 m)  Wt 217 lb 1.9 oz (98.485 kg)  BMI 31.38 kg/m2  LMP 02/01/2011  General:  alert, very talkative, seems to have diffiiculty focusing on one topic. Psych:  Cognition and judgment appear intact. Alert and cooperative with normal attention span and concentration. No apparent delusions, illusions, hallucinations     Assessment & Plan:

## 2011-02-12 NOTE — Assessment & Plan Note (Signed)
>  23 min spent with patient, at least half of which was spent on counseling on her symptoms. She in contracted for safety.  She is experiencing episodes of mania and hypomania. Following with Dr. Dellia Cloud today. I agree that she needs short term disability for both psychological and physical conditions.

## 2011-02-12 NOTE — Letter (Signed)
Summary: [facility]   [facility]   Imported By: Kassie Mends 02/02/2011 09:42:11  _____________________________________________________________________  External Attachment:    Type:   Image     Comment:   External Document

## 2011-02-16 ENCOUNTER — Ambulatory Visit (INDEPENDENT_AMBULATORY_CARE_PROVIDER_SITE_OTHER): Payer: BC Managed Care – PPO | Admitting: Psychology

## 2011-02-16 DIAGNOSIS — F319 Bipolar disorder, unspecified: Secondary | ICD-10-CM

## 2011-02-17 ENCOUNTER — Ambulatory Visit: Payer: BC Managed Care – PPO | Admitting: Family Medicine

## 2011-02-18 ENCOUNTER — Ambulatory Visit (INDEPENDENT_AMBULATORY_CARE_PROVIDER_SITE_OTHER): Payer: BC Managed Care – PPO | Admitting: Family Medicine

## 2011-02-18 ENCOUNTER — Encounter: Payer: Self-pay | Admitting: Family Medicine

## 2011-02-18 DIAGNOSIS — G35 Multiple sclerosis: Secondary | ICD-10-CM

## 2011-02-18 MED ORDER — HYDROCODONE-ACETAMINOPHEN 10-325 MG PO TABS: 1.0000 | ORAL_TABLET | Freq: Four times a day (QID) | ORAL | Status: DC | PRN

## 2011-02-18 NOTE — Progress Notes (Signed)
  Subjective:    Patient ID: Emily Moon, female    DOB: 08/03/1961, 50 y.o.   MRN: [id number]  HPI 50 yo with complicated medical history, including bipolar disorder, progressive MS here to discuss depression and fill out short term disability forms.  Stressors at work are worsening.  Feels a lot of pressure to perform her job but feels her MS is slowing her down and worries that she will loose her job. Chronic pain, referred to Pain Clinic where they ordered xrays and psych referral. Emily Moon saw the psychologist, Dr. [place] Nation,  earlier this week and is following up with him weekly. Admits to episodes of mania in last several months- spends a lot of money and then feels guilty.  Does hoard things although that has improved since she moved here.  She feels that she just cannot keep up with her job and she must keep her job to pay for her medical bills and her son's education.  The PMH, PSH, Social History, Family History, Medications, and allergies have been reviewed in [facility], and have been updated if relevant.    Review of Systems    Review of Systems       See HPI Objective:   Physical Exam BP 130/80  Pulse 60  Temp(Src) 99.2 F (37.3 C) (Oral)  Ht 5' 9.75" (1.772 m)  Wt 217 lb 1.9 oz (98.485 kg)  BMI 31.38 kg/m2  LMP 02/01/2011  General:  alert, very talkative, seems to have diffiiculty focusing on one topic. Psych:  Cognition and judgment appear intact. Alert and cooperative with normal attention span and concentration. No apparent delusions, illusions, hallucinations     Assessment & Plan:

## 2011-02-18 NOTE — Assessment & Plan Note (Signed)
>  25 min spent with patient, at least half of which was spent on counseling on her MS and depression and filling out her forms. FMLA short term disability forms faxed. We are also weaning down her narcotics.

## 2011-02-23 ENCOUNTER — Ambulatory Visit (INDEPENDENT_AMBULATORY_CARE_PROVIDER_SITE_OTHER): Payer: BC Managed Care – PPO | Admitting: Psychology

## 2011-02-23 DIAGNOSIS — F319 Bipolar disorder, unspecified: Secondary | ICD-10-CM

## 2011-02-24 ENCOUNTER — Telehealth: Payer: Self-pay | Admitting: *Deleted

## 2011-02-24 NOTE — Telephone Encounter (Signed)
Patient called and stated that she received a phone call from [employer] and they need OV notes from 01/23/2011 and 01/30/2011.  Patient was not seen in our office on those dates but saw Dr. Dellia Cloud.  Called his office at [phone] and spoke with Victorino Dike.  I have her the dates as listed above and the fax number to [facility] which is [phone], claim number [id number].  She stated that she would get those notes faxed to [facility].  Patient advised.

## 2011-02-25 ENCOUNTER — Telehealth: Payer: Self-pay | Admitting: *Deleted

## 2011-02-25 NOTE — Telephone Encounter (Signed)
Per patients request I called [employer] to check the status of her disability claim.  I spoke with Genevie Cheshire Gene who advised me that her claim has been approved through 03/02/2011, and they are waiting for further/more documentation after her follow up appt to see if she will continue to qualify for further disability.  Office visit notes can be faxed to [phone], claim number [id number].  Patient advised as instructed via telephone.

## 2011-02-27 ENCOUNTER — Encounter: Payer: Self-pay | Admitting: Family Medicine

## 2011-02-27 ENCOUNTER — Ambulatory Visit (INDEPENDENT_AMBULATORY_CARE_PROVIDER_SITE_OTHER): Payer: BC Managed Care – PPO | Admitting: Family Medicine

## 2011-02-27 VITALS — BP 120/80 | HR 76 | Temp 99.3°F | Ht 69.5 in | Wt 221.0 lb

## 2011-02-27 DIAGNOSIS — G35 Multiple sclerosis: Secondary | ICD-10-CM

## 2011-02-27 NOTE — Progress Notes (Signed)
  Subjective:    Patient ID: Emily Moon, female    DOB: 08-11-1961, 51 y.o.   MRN: [id number]  HPI 50 yo with complicated medical history, including bipolar disorder, progressive MS here to discuss her MS and depression.   Forms filled out for short term disability two weeks ago.  Chronic pain, referred to [facility] where they ordered xrays and psych referral.  Ms. Peckenpaugh is seeing psychologist, Dr. Dellia Cloud weekly.  Admits to episodes of mania in last several months- spends a lot of money and then feels guilty.  Does hoard things although that has improved since she moved here.  Stressors at work are worsening.  Feels a lot of pressure to perform her job but feels her MS is slowing her down and worries that she will loose her job. Has noticed more deficits in her cognition, hand-eye coordination and ability to focus. Fearful her MS is getting worse. Has not seen a neurologist since she lived in [place] over 1.5 years ago. Could not tolerate last MS medications she was taking.   The PMH, PSH, Social History, Family History, Medications, and allergies have been reviewed in [facility], and have been updated if relevant.    Review of Systems    Review of Systems       See HPI Objective:   Physical Exam BP 120/80  Pulse 76  Temp(Src) 99.3 F (37.4 C) (Oral)  Ht 5' 9.5" (1.765 m)  Wt 221 lb (100.245 kg)  BMI 32.17 kg/m2  LMP 02/01/2011   General:  alert, very talkative, seems to have diffiiculty focusing on one topic. Psych:  Cognition and judgment appear intact. Alert and cooperative with normal attention span and concentration. No apparent delusions, illusions, hallucinations

## 2011-02-27 NOTE — Assessment & Plan Note (Signed)
>  25 min spent with patient, at least half of which was spent on counseling on her symptoms. Does appear to be progressing but needs neurology evaluation and imaging to assess further. Will refer to neurology. The patient indicates understanding of these issues and agrees with the plan. Patient needs to continue short term disability until she has been evaluated by a neurologist. Will also continue weekly psychotherapy with Dr. Dellia Cloud.

## 2011-02-27 NOTE — Patient Instructions (Addendum)
Please stop by to see Emily Moon on your way out. We will send my note from today to Emily Moon.  Office visit note faxed to Emily Moon today.  Linde Gillis, CMA (AAMA) 2:20 PM

## 2011-03-02 ENCOUNTER — Ambulatory Visit (INDEPENDENT_AMBULATORY_CARE_PROVIDER_SITE_OTHER): Payer: BC Managed Care – PPO | Admitting: Psychology

## 2011-03-02 ENCOUNTER — Telehealth: Payer: Self-pay | Admitting: *Deleted

## 2011-03-02 DIAGNOSIS — F319 Bipolar disorder, unspecified: Secondary | ICD-10-CM

## 2011-03-02 NOTE — Telephone Encounter (Signed)
Received a fax from [facility] requesting medical records for dates 01/23/2011 and 01/30/2011.  Patient was not seen in our office on those dates but was seen in Dr. Dawayne Cirri office on those dates.  I spoke with Victorino Dike on 02/24/2011 and requested that they send patients medical records directly to [facility], she stated that she would fax those records.  Patient was advised that [employer] still does not have the OV notes from the dates listed above and I asked that she call and get them to fax the records.  The patient was driving and on her cell phone and asked that I call back to speak with Victorino Dike and see if they will fax the records to [facility].  I called Victorino Dike at Dr. Dawayne Cirri office and got her voicemail.  I left a very detailed message asking her to fax OV notes to [employer] on patients behalf.

## 2011-03-02 NOTE — Telephone Encounter (Signed)
Spoke with Rep from [facility] and she requested that we refax office visit notes from 02/18/2011 and 02/27/2011.  Notes from both office visits faxed to [facility] at [phone] with patients claim number on each page.  Patient advised.  I also spoke with Victorino Dike at Dr. Dawayne Cirri office and she stated that if they can be of any further assistance to let her know.

## 2011-03-04 ENCOUNTER — Telehealth: Payer: Self-pay | Admitting: *Deleted

## 2011-03-04 ENCOUNTER — Other Ambulatory Visit: Payer: Self-pay | Admitting: *Deleted

## 2011-03-04 MED ORDER — HYDROCODONE-ACETAMINOPHEN 10-325 MG PO TABS: ORAL_TABLET | ORAL | Status: DC

## 2011-03-04 MED ORDER — HYDROCODONE-ACETAMINOPHEN 10-325 MG PO TABS: 1.0000 | ORAL_TABLET | Freq: Four times a day (QID) | ORAL | Status: DC | PRN

## 2011-03-04 MED ORDER — TEMAZEPAM 30 MG PO CAPS: ORAL_CAPSULE | ORAL | Status: DC

## 2011-03-04 MED ORDER — TEMAZEPAM 30 MG PO CAPS: 30.0000 mg | ORAL_CAPSULE | Freq: Two times a day (BID) | ORAL | Status: DC

## 2011-03-04 NOTE — Telephone Encounter (Signed)
Baird Lyons at [place] called to let us know that Ms. Troung is taking Temazepam 1-2 tablets at bedtime.  We had 1 tablet twice daily.  Also she wanted to let us know that she had a conversation with Ms. Milberger a few days ago and Ms. Makarewicz told her that she pretty much takes her pain medication whenever and how ever she feels.  Sometimes she takes as many as 13 tablets per day.  I advised Baird Lyons to please not fill the Hydrocodone Rx and I will let Dr. Dayton Martes know about this.  Please advise.

## 2011-03-04 NOTE — Telephone Encounter (Signed)
Patient called back and stated that she wants to decrease her Hydrocodone dose to 7.5-325, still getting 180 tablets per month.  Please advise.

## 2011-03-04 NOTE — Telephone Encounter (Signed)
Rx's called to [facility].

## 2011-03-05 ENCOUNTER — Other Ambulatory Visit: Payer: Self-pay | Admitting: Family Medicine

## 2011-03-05 ENCOUNTER — Other Ambulatory Visit: Payer: Self-pay | Admitting: *Deleted

## 2011-03-05 ENCOUNTER — Telehealth: Payer: Self-pay | Admitting: *Deleted

## 2011-03-05 MED ORDER — CLONIDINE HCL 0.1 MG/24HR TD PTWK: MEDICATED_PATCH | TRANSDERMAL | Status: DC

## 2011-03-05 MED ORDER — TRAMADOL HCL 50 MG PO TABS: 50.0000 mg | ORAL_TABLET | Freq: Four times a day (QID) | ORAL | Status: DC | PRN

## 2011-03-05 MED ORDER — HYDROCODONE-ACETAMINOPHEN 10-325 MG PO TABS: ORAL_TABLET | ORAL | Status: DC

## 2011-03-05 NOTE — Telephone Encounter (Signed)
Spoke with patient.  Advised her that taking more than she is prescribed is breaking her pain management contract with me and I will no longer be prescribing hydrocodone to her. She has an appointment with pain clinic coming up and advised her to discuss alternative treatments with them since they stated they will not be giving her narcotics either. I will prescribe Tramadol and Restoril to her.

## 2011-03-05 NOTE — Telephone Encounter (Signed)
Rx for Hydrocodone called to [facility], patient advised.

## 2011-03-05 NOTE — Telephone Encounter (Signed)
Patient called to say thank you to Dr. Dayton Martes.  She stated that she is glad that Dr. Dayton Martes made the decision to not prescribe her anymore pain medication.  By her being out of work this will give her time to get through all of this.  She wanted to know what should she do for the nausea and vomiting that she experience when she goes without the pain medication?  Also how long does it usually take for someone to completely go through the withdrawal process?  Weeks? Months?  Please advise.  Uses [facility].

## 2011-03-05 NOTE — Telephone Encounter (Signed)
Yes, I am aware of clonidine but I wanted to talk with pain specialist first.

## 2011-03-05 NOTE — Telephone Encounter (Signed)
Patient called back to let Dr. Dayton Martes know that her Psychologist suggest Clonidine.  Advised Emily Moon that Dr. Dayton Martes is waiting to hear from the Pain Clinic and we will get back in contact with her.

## 2011-03-05 NOTE — Telephone Encounter (Signed)
Spoke with Dr. Shireen Quan, pain management. In addition to taper, will will add Clonidine .1 mg patch, see order.

## 2011-03-05 NOTE — Telephone Encounter (Signed)
Talked with patient- clondine should really be done in an inpatient setting or by pain specialists.  I also cannot prescribe methadone.  I urged her to go to the hospital to withdraw safely but she is unwilling to do that at this point. I agreed to send in two week taper of narcotics and there will be no more refills and advised to go to the hospital once she develops symptoms. Pt agreed with plan.

## 2011-03-05 NOTE — Telephone Encounter (Signed)
Rx called to [facility]. 

## 2011-03-05 NOTE — Telephone Encounter (Signed)
I have called the pain clinic and I am waiting for them to return my call. They can often often prescribe other medications to help her through this, including methadone which I am not licensed to prescribe.  When they call me back, I will let you know. Hopefully symptoms wont last longer than days to weeks.

## 2011-03-09 ENCOUNTER — Ambulatory Visit: Payer: BC Managed Care – PPO | Admitting: Psychology

## 2011-03-10 ENCOUNTER — Telehealth: Payer: Self-pay | Admitting: *Deleted

## 2011-03-10 NOTE — Telephone Encounter (Signed)
Pt called to report that

## 2011-03-10 NOTE — Telephone Encounter (Signed)
Opened in error

## 2011-03-11 ENCOUNTER — Telehealth: Payer: Self-pay | Admitting: *Deleted

## 2011-03-11 NOTE — Telephone Encounter (Signed)
Patient advised as instructed via message left on cell phone voicemail. 

## 2011-03-11 NOTE — Telephone Encounter (Signed)
Patient called to let Dr. Dayton Martes know how she is doing.  She stated that she is not sleeping much at all, only getting about 2-3 hours of sleep per night.  She stated that she can't seem to get her body temperature regulated because she is either too hot or too cold.  She is experiencing some nausea, no vomiting, and loose stools but no diarrhea.  She stated that she doesn't feel like she needs the Clonopin patch at all.  Would rather not use it.  She stated that she is not using the Tramadol much if at all.

## 2011-03-11 NOTE — Telephone Encounter (Signed)
Ok thank you for the update.

## 2011-03-12 ENCOUNTER — Telehealth: Payer: Self-pay | Admitting: *Deleted

## 2011-03-12 NOTE — Telephone Encounter (Signed)
Received faxed from [facility] requesting specific information regarding Emily Moon.  Form is in your IN box.

## 2011-03-13 NOTE — Telephone Encounter (Signed)
Spoke with Ms. Emily Moon and asked her when she wanted to return to work.  She stated that she wanted to return to work the middle or end of June.  Was advised by Dr. Dayton Martes that we cannot keep her out of work that long because we have no supporting documentation.  Patient advised and she stated that the neurologist will be helping her with the paper work as well.  Advised patient that for now we will just leave return to work date as undetermined.

## 2011-03-13 NOTE — Telephone Encounter (Signed)
Thank you :)

## 2011-03-16 ENCOUNTER — Ambulatory Visit (INDEPENDENT_AMBULATORY_CARE_PROVIDER_SITE_OTHER): Payer: BC Managed Care – PPO | Admitting: Psychology

## 2011-03-16 DIAGNOSIS — F319 Bipolar disorder, unspecified: Secondary | ICD-10-CM

## 2011-03-18 ENCOUNTER — Telehealth: Payer: Self-pay | Admitting: *Deleted

## 2011-03-18 NOTE — Telephone Encounter (Signed)
It inhibits your absorption of fat in the gut so you will have GI side effects at first, especially if you are eating fatty foods. It has however been beneficial in some people.

## 2011-03-18 NOTE — Telephone Encounter (Signed)
Patient called and wanted to let Dr. Dayton Martes know a few things that she has been experiencing.  Her body temperature is still not regulated, she stated that she went from being hot or cold to now having a slight fever.  She is also having upset stomach, gas, bloating, and diarrhea.  She wanted to know if there was anything she could take or do to ease some or all of these symptoms.  Please advise.

## 2011-03-18 NOTE — Telephone Encounter (Signed)
Patient called back to see what Dr. Elmer Sow opinion is about the diet pill Alli.  Please advise.

## 2011-03-19 NOTE — Telephone Encounter (Signed)
Patient advised as instructed via message left on cell phone voicemail. 

## 2011-03-23 ENCOUNTER — Ambulatory Visit (INDEPENDENT_AMBULATORY_CARE_PROVIDER_SITE_OTHER): Payer: BC Managed Care – PPO | Admitting: Psychology

## 2011-03-23 DIAGNOSIS — F319 Bipolar disorder, unspecified: Secondary | ICD-10-CM

## 2011-03-27 ENCOUNTER — Telehealth: Payer: Self-pay | Admitting: *Deleted

## 2011-03-27 ENCOUNTER — Encounter: Payer: Self-pay | Admitting: Family Medicine

## 2011-03-27 ENCOUNTER — Ambulatory Visit (INDEPENDENT_AMBULATORY_CARE_PROVIDER_SITE_OTHER): Payer: BC Managed Care – PPO | Admitting: Family Medicine

## 2011-03-27 DIAGNOSIS — R110 Nausea: Secondary | ICD-10-CM

## 2011-03-27 DIAGNOSIS — K219 Gastro-esophageal reflux disease without esophagitis: Secondary | ICD-10-CM

## 2011-03-27 MED ORDER — TRAMADOL HCL 50 MG PO TABS: 50.0000 mg | ORAL_TABLET | Freq: Four times a day (QID) | ORAL | Status: DC | PRN

## 2011-03-27 MED ORDER — ESOMEPRAZOLE MAGNESIUM 40 MG PO CPDR: 40.0000 mg | DELAYED_RELEASE_CAPSULE | Freq: Every day | ORAL | Status: DC

## 2011-03-27 NOTE — Assessment & Plan Note (Signed)
Deteriorated, now with nausea. Likely related to Gabapentin. Advised not taking it anymore and discussing treatment options with Pain Clinic. Will also give short term supply of Nexium. The patient indicates understanding of these issues and agrees with the plan.

## 2011-03-27 NOTE — Progress Notes (Signed)
  Subjective:    Patient ID: Emily Moon, female    DOB: [birth year], 50 y.o.   MRN: [id number]  HPI 50 yo with complicated medical history, including bipolar disorder, progressive MS here to discuss her MS and depression.  Went to neurologist , Dr. Epimenio Foot, earlier this week. Notes reviewed, MRI ordered to further evaluate.   Results pending.  Chronic pain, referred to Pain Clinic where they ordered xrays and psych referral. Placed her back on narcotics after I refused to prescribe them.  Has lost quite a bit of weight during the 3 weeks that she was not on narcotics: Wt Readings from Last 3 Encounters:  03/27/11 212 lb 6.4 oz (96.344 kg)  02/27/11 221 lb (100.245 kg)  02/18/11 216 lb 1.9 oz (98.031 kg)    Also placed her on Gabapentin 800 mg daily which she stopped taking because of the following symptoms:  GERD, dizziness, gassiness, sore throat in the morning, flatulence.  Denies vomiting, changes in her bowel or fevers.   The PMH, PSH, Social History, Family History, Medications, and allergies have been reviewed in [facility], and have been updated if relevant.     Review of Systems       See HPI Objective:   Physical Exam BP 130/80  Pulse 107  Temp(Src) 99.4 F (37.4 C) (Oral)  Wt 212 lb 6.4 oz (96.344 kg)   General:  alert, very talkative, seems to have diffiiculty focusing on one topic. Abd:  Soft, NT, pos BS Psych:  Cognition and judgment appear intact. Alert and cooperative with normal attention span and concentration. No apparent delusions, illusions, hallucinations

## 2011-03-27 NOTE — Assessment & Plan Note (Signed)
New.  See above. 

## 2011-03-27 NOTE — Telephone Encounter (Signed)
Nexium requires PA.  Called [employer] and spoke with Loraine Leriche, he will fax PA forms to our office in 24-48 hours.  Form put on Laurie's desk.

## 2011-03-30 ENCOUNTER — Telehealth: Payer: Self-pay | Admitting: *Deleted

## 2011-03-30 ENCOUNTER — Ambulatory Visit: Payer: BC Managed Care – PPO | Admitting: Psychology

## 2011-03-30 NOTE — Telephone Encounter (Signed)
Prior Emily Moon is needed for nexium, forms are on your desk.

## 2011-03-30 NOTE — Telephone Encounter (Signed)
In my box

## 2011-03-30 NOTE — Telephone Encounter (Signed)
PA form faxed to [employer] at [phone].

## 2011-03-31 ENCOUNTER — Telehealth: Payer: Self-pay | Admitting: *Deleted

## 2011-03-31 NOTE — Telephone Encounter (Signed)
Prior auth given for nexium, advised pharmacy, approval letter placed on doctor's desk for signature and scanning.

## 2011-04-09 ENCOUNTER — Encounter: Payer: Self-pay | Admitting: Family Medicine

## 2011-04-20 ENCOUNTER — Encounter: Payer: Self-pay | Admitting: Family Medicine

## 2011-04-20 ENCOUNTER — Ambulatory Visit (INDEPENDENT_AMBULATORY_CARE_PROVIDER_SITE_OTHER): Payer: BC Managed Care – PPO | Admitting: Family Medicine

## 2011-04-20 VITALS — BP 122/80 | HR 107 | Temp 99.4°F | Ht 69.5 in | Wt 210.8 lb

## 2011-04-20 DIAGNOSIS — R300 Dysuria: Secondary | ICD-10-CM

## 2011-04-20 DIAGNOSIS — G35 Multiple sclerosis: Secondary | ICD-10-CM

## 2011-04-20 LAB — POCT URINALYSIS DIPSTICK
Spec Grav, UA: 1.005
Urobilinogen, UA: NEGATIVE
pH, UA: 8.5

## 2011-04-20 NOTE — Progress Notes (Signed)
  Subjective:    Patient ID: Emily Moon, female    DOB: 01-Jun-1961, 50 y.o.   MRN: [id number]  HPI 50 yo with complicated medical history, including bipolar disorder, progressive MS here to discuss her MS and depression, fill out disability paperwork.  Went to neurologist , Dr. Epimenio Foot. Dr. Epimenio Foot agrees that she has MS, per pt, but does not feel that she cannot work.    Chronic pain, referred to Pain Clinic where they ordered xrays and psych referral. Placed her back on narcotics after I refused to prescribe them. Has been to psychologist as well.  Emily Moon feels she can no longer work, now wants to apply for long term disability.  Hand and eye coordination worse. Cannot concentrate, follow conversations on the phone.  UTI- was out of town 2 weeks ago, developed worsening stomach pain, dysuria. Found to have UTI. Just finished 10 day course of cipro.  The PMH, PSH, Social History, Family History, Medications, and allergies have been reviewed in [facility], and have been updated if relevant.     Review of Systems       See HPI Objective:   Physical Exam BP 122/80  Pulse 107  Temp(Src) 99.4 F (37.4 C) (Oral)  Ht 5' 9.5" (1.765 m)  Wt 210 lb 12.8 oz (95.618 kg)  BMI 30.68 kg/m2  General:  alert, very talkative, seems to have diffiiculty focusing on one topic. Abd:  Soft, NT, pos BS Psych:  Cognition and judgment appear intact. Alert and cooperative with normal attention span and concentration. No apparent delusions, illusions, hallucinations Neuro:  Difficulty concentrating during our office visit today, grip strength normal bilaterally, having difficulty pushing buttons and phone, other fine motor skills.

## 2011-04-20 NOTE — Assessment & Plan Note (Addendum)
Pt feels she is deteriorating. >25 min spent with face to face with patient counseling and coordinating care Filled out FMLA forms stating that pt feels she can no longer work, however I do not have objective data. This needs to be pursued with neurology.

## 2011-04-21 ENCOUNTER — Other Ambulatory Visit: Payer: Self-pay | Admitting: *Deleted

## 2011-04-21 MED ORDER — PRAVASTATIN SODIUM 20 MG PO TABS: 20.0000 mg | ORAL_TABLET | Freq: Every day | ORAL | Status: DC

## 2011-04-22 ENCOUNTER — Ambulatory Visit (INDEPENDENT_AMBULATORY_CARE_PROVIDER_SITE_OTHER): Payer: BC Managed Care – PPO | Admitting: Psychology

## 2011-04-22 DIAGNOSIS — F319 Bipolar disorder, unspecified: Secondary | ICD-10-CM

## 2011-04-22 LAB — URINE CULTURE: Colony Count: NO GROWTH

## 2011-04-24 ENCOUNTER — Telehealth: Payer: Self-pay | Admitting: *Deleted

## 2011-04-24 NOTE — Telephone Encounter (Signed)
I'm sorry but I don't have any objective data, such as an MRI or neuro exam,  like neurology does, which is why expected this to be denied.  I cannot objectively comment on hand eye coordination or speech because I am not a neurologist and we never did a detailed neurological exam during our visist. I definitely believe her that these she feels her symptoms have worsened,  but I cannot state that I have findings to support that..   In my paperwork, I did mention that she had those symptoms and that she is easily distracted, and I don't think an additional statement would help because I would have to say that I have no objective data to support this.

## 2011-04-24 NOTE — Telephone Encounter (Signed)
Patient called to let Dr. Dayton Martes know that her claim has be denied thus far from [facility].  She states that she needs Dr. Dayton Martes to write a statement with detailed symptoms, clinical support, etc stating that she has observed patients health decline since we started seeing her.  Speech problems, hand/eye coordination, decreased listening skills because of distractions.  She stated that we have to fax this to [facility] along with her claim number in order for this to be processed.

## 2011-04-27 ENCOUNTER — Ambulatory Visit (INDEPENDENT_AMBULATORY_CARE_PROVIDER_SITE_OTHER): Payer: BC Managed Care – PPO | Admitting: Psychology

## 2011-04-27 DIAGNOSIS — F319 Bipolar disorder, unspecified: Secondary | ICD-10-CM

## 2011-04-27 NOTE — Telephone Encounter (Signed)
Patient advised as instructed via telephone.  I advised her to contact the neurology office to make sure that they are sending there office visit notes, findings, MRI reports etc to [employer].  She stated that she will call me back and let me know.

## 2011-04-29 ENCOUNTER — Encounter: Payer: Self-pay | Admitting: Family Medicine

## 2011-04-29 ENCOUNTER — Ambulatory Visit (INDEPENDENT_AMBULATORY_CARE_PROVIDER_SITE_OTHER): Payer: BC Managed Care – PPO | Admitting: Family Medicine

## 2011-04-29 VITALS — BP 110/70 | HR 103 | Temp 99.7°F | Ht 69.5 in | Wt 215.2 lb

## 2011-04-29 DIAGNOSIS — R252 Cramp and spasm: Secondary | ICD-10-CM

## 2011-04-29 LAB — BASIC METABOLIC PANEL
BUN: 8 mg/dL (ref 6–23)
CO2: 31 mEq/L (ref 19–32)
GFR: 75.54 mL/min (ref >60.00)
Glucose, Bld: 98 mg/dL (ref 70–99)
Potassium: 3.9 mEq/L (ref 3.5–5.1)
Sodium: 139 mEq/L (ref 135–145)

## 2011-04-29 MED ORDER — TIZANIDINE HCL 4 MG PO TABS: 4.0000 mg | ORAL_TABLET | Freq: Four times a day (QID) | ORAL | Status: AC | PRN

## 2011-04-29 NOTE — Progress Notes (Signed)
  Subjective:    Patient ID: Emily Moon, female    DOB: December 19, 1960, 50 y.o.   MRN: [id number]  HPI 50 yo with complicated medical history, including bipolar disorder, progressive MS here for one week of bilateral back and leg cramps.  Legs hurt "all over." Sometimes feels like they will give out but that is not uncommon with her MS. No redness or warmth in her legs. No fevers. Sometimes wakes up in middle of night with bilateral leg cramps. Not very active due to her chronic pain issues.    The PMH, PSH, Social History, Family History, Medications, and allergies have been reviewed in [facility], and have been updated if relevant.     Review of Systems       See HPI Objective:   Physical Exam BP 110/70  Pulse 103  Temp(Src) 99.7 F (37.6 C) (Oral)  Ht 5' 9.5" (1.765 m)  Wt 215 lb 4 oz (97.637 kg)  BMI 31.33 kg/m2  LMP 04/19/2011  General:  alert, very talkative, seems to have diffiiculty focusing on one topic. Abd:  Soft, NT, pos BS Psych:  Cognition and judgment appear intact. Alert and cooperative with normal attention span and concentration. No apparent delusions, illusions, hallucinations Ext:  No redness, warmth or edema of bilateral legs.  Normal strength, sensation and pulses. Neuro:  Difficulty concentrating during our office visit today, grip strength normal bilaterally, having difficulty pushing buttons and phone, other fine motor skills.  1. Leg cramps  CK (Creatine Kinase), Basic Metabolic Panel (BMET)  New. Likely multifactorial- MS with possible electrolyte abnormality. Will refill her Zanaflex, check BMET and CK. The patient indicates understanding of these issues and agrees with the plan.

## 2011-04-30 ENCOUNTER — Telehealth: Payer: Self-pay | Admitting: *Deleted

## 2011-04-30 NOTE — Telephone Encounter (Signed)
I think we have samples of Soma that we could try if she is interested.

## 2011-04-30 NOTE — Telephone Encounter (Signed)
Samples of Soma left at front desk for patient to pick up.  Lot# [id number] Exp 01/2012.  12 tablets.  Patient advised via telephone.

## 2011-04-30 NOTE — Telephone Encounter (Signed)
Is she scheduled to see neurology soon?  They can do tests on her leg muscles if they feel that is appropriate. From my standpoint, I would continue the muscle relaxants to see if that helps and if it does not over next few days, consider discussing with neurology.

## 2011-04-30 NOTE — Telephone Encounter (Signed)
Patient advised as instructed via telephone.  She stated that she does not have a f/u appt with Neurologist but she will call and make an appt to see them.  She is requesting a different muscle relaxer because the one we prescribed is not helping much at all.  [facility]ses [facility].

## 2011-04-30 NOTE — Telephone Encounter (Signed)
Patient called and I advised her of her lab results.  Since labs were all normal patient wants to know what is the next step in finding out why her legs continue to cramp and she continues to have pain.  She is not requesting any medication but just wants to know what else she can do.  Please advise.

## 2011-05-04 ENCOUNTER — Ambulatory Visit: Payer: BC Managed Care – PPO | Admitting: Psychology

## 2011-05-04 ENCOUNTER — Telehealth: Payer: Self-pay | Admitting: *Deleted

## 2011-05-04 DIAGNOSIS — E669 Obesity, unspecified: Secondary | ICD-10-CM

## 2011-05-04 NOTE — Telephone Encounter (Signed)
We can check her 24 hour urinary cortisol although you would need several different tests to confirm it.  This would be a place we could start.  I tried to place the order but I could not sign it, please ask Terri to enter.

## 2011-05-04 NOTE — Telephone Encounter (Signed)
Pt is asking if she has ever been checked for Cushing's Disease.  Her friend, who is a diabetic nutritionist, has told her that that could be the reason she has been uable to lose weight.  She says she has been checked for everything else and now wants to be checked for that.  Please advise  I told her you are out of the office today.

## 2011-05-05 ENCOUNTER — Other Ambulatory Visit: Payer: BC Managed Care – PPO

## 2011-05-05 ENCOUNTER — Other Ambulatory Visit: Payer: Self-pay | Admitting: Family Medicine

## 2011-05-05 DIAGNOSIS — R635 Abnormal weight gain: Secondary | ICD-10-CM

## 2011-05-05 NOTE — Telephone Encounter (Signed)
Patient advised as instructed via telephone.  She would like to do the urine cortisol test.  She will come in today to pick up urine container.

## 2011-05-07 ENCOUNTER — Other Ambulatory Visit: Payer: Self-pay | Admitting: Family Medicine

## 2011-05-07 ENCOUNTER — Ambulatory Visit (INDEPENDENT_AMBULATORY_CARE_PROVIDER_SITE_OTHER): Payer: BC Managed Care – PPO | Admitting: Psychology

## 2011-05-07 DIAGNOSIS — F319 Bipolar disorder, unspecified: Secondary | ICD-10-CM

## 2011-05-11 ENCOUNTER — Ambulatory Visit (INDEPENDENT_AMBULATORY_CARE_PROVIDER_SITE_OTHER): Payer: BC Managed Care – PPO | Admitting: Psychology

## 2011-05-11 ENCOUNTER — Other Ambulatory Visit: Payer: Self-pay | Admitting: *Deleted

## 2011-05-11 DIAGNOSIS — F319 Bipolar disorder, unspecified: Secondary | ICD-10-CM

## 2011-05-11 MED ORDER — METHYLPHENIDATE HCL 10 MG PO TABS: 10.0000 mg | ORAL_TABLET | Freq: Two times a day (BID) | ORAL | Status: DC

## 2011-05-11 NOTE — Telephone Encounter (Signed)
Left message on machine at home advising patient that Rx is ready for pick up will be left at front desk. 

## 2011-05-13 ENCOUNTER — Telehealth: Payer: Self-pay | Admitting: *Deleted

## 2011-05-13 NOTE — Telephone Encounter (Signed)
Patient notified as instructed via telephone.  She will discuss questions with Dr. Dayton Martes tomorrow at office visit.

## 2011-05-13 NOTE — Telephone Encounter (Signed)
Patient wanted to know if she can come in early tomorrow morning to have labs done instead of waiting until after she sees Dr. Dayton Martes for lab work?  I advised patient that I would ask Dr. Dayton Martes and see if that's ok.  Usually Dr. Dayton Martes will discuss with patient during the visit what labs she wants to order and so forth.  Please advise.

## 2011-05-13 NOTE — Telephone Encounter (Signed)
What labs are we ordering? Her urine cortisol was fine so there are no additional labs that I know to order at this point.

## 2011-05-14 ENCOUNTER — Ambulatory Visit (INDEPENDENT_AMBULATORY_CARE_PROVIDER_SITE_OTHER): Payer: BC Managed Care – PPO | Admitting: Family Medicine

## 2011-05-14 ENCOUNTER — Encounter: Payer: Self-pay | Admitting: Family Medicine

## 2011-05-14 VITALS — BP 110/80 | HR 100 | Temp 99.7°F | Wt 200.8 lb

## 2011-05-14 DIAGNOSIS — J329 Chronic sinusitis, unspecified: Secondary | ICD-10-CM | POA: Insufficient documentation

## 2011-05-14 MED ORDER — AZITHROMYCIN 250 MG PO TABS: ORAL_TABLET | ORAL | Status: DC

## 2011-05-14 NOTE — Progress Notes (Signed)
  Subjective:    Patient ID: Emily Moon, female    DOB: [birth year], 50 y.o.   MRN: [id number]  HPI 50 yo with complicated medical history, including bipolar disorder, progressive MS here for URI symptoms.  Takes Claritin which typically helps with her seasonal allergies. Past two weeks, increased runny nose, sneezing, sinus pressure. Febrile, Tmax 101. Dry cough. No CP or SOB. No rashes.  Per pt, cannot take Allegra because it worsens her menstrual cramps.  The PMH, PSH, Social History, Family History, Medications, and allergies have been reviewed in [facility], and have been updated if relevant.     Review of Systems       See HPI Objective:   Physical Exam LMP 04/19/2011 BP 110/80  Pulse 100  Temp(Src) 99.7 F (37.6 C) (Oral)  Wt 200 lb 12 oz (91.06 kg)  LMP 05/14/2011  General:  alert, very talkative, seems to have diffiiculty focusing on one topic. HEENT:  Boggy turbinates, sinuses +/- TTP, TMs retracted bilaterally Resp:  Scattered exp wheezes, no increased WOB CVS:  RRR  Assessment and Plan: 1. Sinusitis    New. Advised trying Zyrtec, Zpack. See pt instructions for details.

## 2011-05-14 NOTE — Patient Instructions (Signed)
Take antibiotic as directed.  Drink lots of fluids.  Treat sympotmatically with Mucinex, nasal saline irrigation, and Tylenol/Ibuprofen. Also try claritin D or zyrtec D over the counter- two times a day as needed ( have to sign for them at pharmacy). You can use warm compresses.  Cough suppressant at night. Call if not improving as expected in 5-7 days.    

## 2011-05-15 ENCOUNTER — Telehealth: Payer: Self-pay | Admitting: *Deleted

## 2011-05-15 ENCOUNTER — Ambulatory Visit: Payer: BC Managed Care – PPO | Admitting: Family Medicine

## 2011-05-15 ENCOUNTER — Ambulatory Visit: Payer: BC Managed Care – PPO | Admitting: Internal Medicine

## 2011-05-15 ENCOUNTER — Telehealth: Payer: Self-pay | Admitting: Family Medicine

## 2011-05-15 MED ORDER — BENZONATATE 100 MG PO CAPS: 100.0000 mg | ORAL_CAPSULE | Freq: Four times a day (QID) | ORAL | Status: DC | PRN

## 2011-05-15 NOTE — Telephone Encounter (Signed)
I already sent Tessalon into her pharmacy earlier today.

## 2011-05-15 NOTE — Telephone Encounter (Signed)
Left message with patients mom advising that a Rx has been sent to [facility] to help with the cough.

## 2011-05-15 NOTE — Telephone Encounter (Signed)
Patient was seen yesterday and she says that she was up all night coughing. She is asking if she can get something called in to help with her cough, especially through the night. [facility]ses [facility].

## 2011-05-15 NOTE — Telephone Encounter (Signed)
Pt. Said she can't stop coughing and she wanted to know if you would call in a rx for cough medicine to [facility].  She said she's been taking Delsym,but it's not helping her cough.

## 2011-05-15 NOTE — Telephone Encounter (Signed)
Tessalon sent to [facility].

## 2011-05-15 NOTE — Telephone Encounter (Signed)
Rx has already been sent to pharmacy, left message with her mom advising as instructed.

## 2011-05-18 ENCOUNTER — Ambulatory Visit: Payer: BC Managed Care – PPO | Admitting: Psychology

## 2011-06-01 ENCOUNTER — Ambulatory Visit: Payer: BC Managed Care – PPO | Admitting: Family Medicine

## 2011-06-02 ENCOUNTER — Other Ambulatory Visit: Payer: Self-pay | Admitting: *Deleted

## 2011-06-02 MED ORDER — TEMAZEPAM 30 MG PO CAPS: ORAL_CAPSULE | ORAL | Status: DC

## 2011-06-03 ENCOUNTER — Ambulatory Visit (INDEPENDENT_AMBULATORY_CARE_PROVIDER_SITE_OTHER): Payer: BC Managed Care – PPO | Admitting: Family Medicine

## 2011-06-03 ENCOUNTER — Encounter: Payer: Self-pay | Admitting: Family Medicine

## 2011-06-03 ENCOUNTER — Encounter: Payer: Self-pay | Admitting: *Deleted

## 2011-06-03 VITALS — BP 102/80 | HR 76 | Temp 99.7°F | Wt 202.8 lb

## 2011-06-03 DIAGNOSIS — M25552 Pain in left hip: Secondary | ICD-10-CM | POA: Insufficient documentation

## 2011-06-03 DIAGNOSIS — M25559 Pain in unspecified hip: Secondary | ICD-10-CM

## 2011-06-03 MED ORDER — TRAMADOL HCL 50 MG PO TABS: 50.0000 mg | ORAL_TABLET | Freq: Four times a day (QID) | ORAL | Status: DC | PRN

## 2011-06-03 NOTE — Telephone Encounter (Signed)
Rx called to [facility]. 

## 2011-06-03 NOTE — Progress Notes (Signed)
  Subjective:    Patient ID: Emily Moon, female    DOB: 1960-12-31, 50 y.o.   MRN: [id number]  HPI 50 yo with complicated medical history, including bipolar disorder, progressive MS here for left hip pain.  Says that she has frequent bursitis and hip "popping out of place." Feels like it has popped out.  Hurts all the time from top of hip, down left leg. No radiculopathy or weakness.  No pain radiating to groin. No urinary symptoms.  Also wants to discuss getting a note from me saying that she [place] accom[place]ations [place] the airport due to her MS and chronic pain.    The PMH, PSH, Social History, Family History, Medications, and allergies have been reviewed in [facility], and have been updated if relevant.     Review of Systems       See HPI Objective:   Physical Exam BP 102/80  Pulse 76  Temp(Src) 99.7 F (37.6 C) (Oral)  Wt 202 lb 12 oz (91.967 kg)  LMP 05/14/2011  General:  alert, very talkative, seems to have diffiiculty focusing on one topic. MSK:  tenderness just superior to the trochanteric process on left, FROM hips bilaterally although she has pain with external rotation.   Assessment and Plan: 1. Left hip pain  Ambulatory referral to Orthopedic Surgery   Deteriorated. Refer to ortho for possible corticosteroid injections and further work up.

## 2011-06-04 ENCOUNTER — Other Ambulatory Visit: Payer: Self-pay | Admitting: Sports Medicine

## 2011-06-04 DIAGNOSIS — M545 Low back pain, unspecified: Secondary | ICD-10-CM

## 2011-06-04 DIAGNOSIS — M169 Osteoarthritis of hip, unspecified: Secondary | ICD-10-CM

## 2011-06-08 ENCOUNTER — Ambulatory Visit: Payer: BC Managed Care – PPO | Admitting: Psychology

## 2011-06-08 ENCOUNTER — Ambulatory Visit
Admission: RE | Admit: 2011-06-08 | Discharge: 2011-06-08 | Disposition: A | Discharge status: Home / Self Care | Payer: BC Managed Care – PPO | Source: Ambulatory Visit | Attending: Sports Medicine | Admitting: Sports Medicine

## 2011-06-08 DIAGNOSIS — M545 Low back pain, unspecified: Secondary | ICD-10-CM

## 2011-06-08 DIAGNOSIS — M169 Osteoarthritis of hip, unspecified: Secondary | ICD-10-CM

## 2011-06-08 MED ORDER — METHYLPREDNISOLONE ACETATE 40 MG/ML INJ SUSP (RADIOLOG
120.0000 mg | Freq: Once | INTRAMUSCULAR | Status: AC
  Administered 2011-06-08: 120 mg via INTRA_ARTICULAR

## 2011-06-08 MED ORDER — IOHEXOL 180 MG/ML  SOLN
1.0000 mL | Freq: Once | INTRAMUSCULAR | Status: AC | PRN
  Administered 2011-06-08: 1 mL via INTRA_ARTICULAR

## 2011-06-15 ENCOUNTER — Ambulatory Visit (INDEPENDENT_AMBULATORY_CARE_PROVIDER_SITE_OTHER): Payer: BC Managed Care – PPO | Admitting: Psychology

## 2011-06-15 ENCOUNTER — Other Ambulatory Visit: Payer: Self-pay | Admitting: *Deleted

## 2011-06-15 DIAGNOSIS — F319 Bipolar disorder, unspecified: Secondary | ICD-10-CM

## 2011-06-15 MED ORDER — TRAMADOL HCL 50 MG PO TABS: 50.0000 mg | ORAL_TABLET | Freq: Three times a day (TID) | ORAL | Status: DC | PRN

## 2011-06-16 ENCOUNTER — Telehealth: Payer: Self-pay | Admitting: *Deleted

## 2011-06-16 MED ORDER — AZITHROMYCIN 250 MG PO TABS: ORAL_TABLET | ORAL | Status: DC

## 2011-06-16 NOTE — Telephone Encounter (Signed)
Patient notified

## 2011-06-16 NOTE — Telephone Encounter (Signed)
Uses [facility].

## 2011-06-16 NOTE — Telephone Encounter (Signed)
I typically never call in an abx without seeing someone. This is likely viral. I will send in rx for Zpack just this once but I recommend her not taking it unless symptoms last for more than another 7 days.

## 2011-06-16 NOTE — Telephone Encounter (Signed)
Patient is coughing, sneezing, ears feel stopped up. Feels like it is in her chest. Having a low grade fever. She says that her insurance is running out today and is asking if something can be called in since we have not appt. I told her that she would need an appt, before an antibiotic could be called in, but she says that she can't afford to to pay out of pocket for an office visit after today.

## 2011-06-17 ENCOUNTER — Telehealth: Payer: Self-pay | Admitting: *Deleted

## 2011-06-17 NOTE — Telephone Encounter (Signed)
Noted  

## 2011-06-17 NOTE — Telephone Encounter (Signed)
Pt just wanted to let you know that she started on her z-pack last night, because, she says, all of her symptoms came to a head last night.

## 2011-06-23 ENCOUNTER — Telehealth: Payer: Self-pay | Admitting: *Deleted

## 2011-06-23 DIAGNOSIS — M25552 Pain in left hip: Secondary | ICD-10-CM

## 2011-06-23 DIAGNOSIS — G894 Chronic pain syndrome: Secondary | ICD-10-CM

## 2011-06-23 NOTE — Telephone Encounter (Signed)
I will place another referral to another practice.  It may be next week before she hears back from [place].

## 2011-06-23 NOTE — Telephone Encounter (Signed)
Emily Moon spoke with patient and she does not want to see another Ortho doctor.  She just wants to know what they recommend for her to due regarding her leg and hip pain.

## 2011-06-23 NOTE — Telephone Encounter (Signed)
Please call to get last office note from her ortho.

## 2011-06-23 NOTE — Telephone Encounter (Signed)
Patient called and wanted to know if we could contact the first Ortho doctor that we referred her to and find out what she is suppose to do as far as a treatment plan.  She stated that she is still in pain and having trouble getting around due to leg and hip pain.  She stated that while she was there in the office being seen she was not allowed to ask many questions.  She stated that the doctor told her that there was not much they could do for her.  Please advise.

## 2011-06-24 NOTE — Telephone Encounter (Signed)
Called and left a message with patients mother to have her return call.

## 2011-06-25 NOTE — Telephone Encounter (Signed)
Received last OV note from Dr. Blenda Bridegroom office.  Advised patient that he is recommending physical therapy to help her hip pain since she has already had two injections in her hip.  Patient stated that she cannot afford PT right now because she has no insurance.  Advised patient that I would check with Shirlee Limerick or Aram Beecham to see if they knew of any PT offices that will let patient pay by payment arrangements or that offers some type of reduced fees for patients with no insurance and let her know.

## 2011-06-25 NOTE — Telephone Encounter (Signed)
Thank you :)

## 2011-06-25 NOTE — Telephone Encounter (Signed)
Spoke with patient and she stated that she saw Dr. Farris Has at [facility].  Advised patient that I will call to get records.  I called and was transferred to medical records, got no answer or machine.  I will call again later.

## 2011-06-29 ENCOUNTER — Telehealth: Payer: Self-pay | Admitting: Family Medicine

## 2011-06-29 DIAGNOSIS — M25552 Pain in left hip: Secondary | ICD-10-CM

## 2011-06-29 NOTE — Telephone Encounter (Signed)
Dr. Dayton Martes, please put referral in for [facility] outpatient rehab.  Thanks, Aram Beecham

## 2011-06-29 NOTE — Telephone Encounter (Signed)
I have cancelled the dupicated Othop referral, pt agreed to have P/T with [facility]. They will accept pymts from the pt.  Scheduled for August 23,2012 at 11:30am. cdavis

## 2011-06-29 NOTE — Telephone Encounter (Signed)
Thanks, Processing referral...cdavis

## 2011-06-29 NOTE — Telephone Encounter (Signed)
Referral placed.

## 2011-07-03 ENCOUNTER — Other Ambulatory Visit: Payer: Self-pay | Admitting: *Deleted

## 2011-07-03 MED ORDER — TEMAZEPAM 30 MG PO CAPS: ORAL_CAPSULE | ORAL | Status: DC

## 2011-07-03 NOTE — Telephone Encounter (Signed)
Rx called to pharmacy

## 2011-07-06 ENCOUNTER — Other Ambulatory Visit: Payer: Self-pay | Admitting: *Deleted

## 2011-07-06 MED ORDER — METHYLPHENIDATE HCL 10 MG PO TABS: 10.0000 mg | ORAL_TABLET | Freq: Two times a day (BID) | ORAL | Status: DC

## 2011-07-06 NOTE — Telephone Encounter (Signed)
Needs ritalin rx, please call when ready.

## 2011-07-06 NOTE — Telephone Encounter (Signed)
Patient advised Rx is ready for pick up will be left at front desk.

## 2011-07-09 ENCOUNTER — Telehealth: Payer: Self-pay | Admitting: *Deleted

## 2011-07-09 ENCOUNTER — Ambulatory Visit: Payer: BC Managed Care – PPO | Attending: Family Medicine

## 2011-07-09 DIAGNOSIS — M25559 Pain in unspecified hip: Secondary | ICD-10-CM | POA: Insufficient documentation

## 2011-07-09 DIAGNOSIS — R262 Difficulty in walking, not elsewhere classified: Secondary | ICD-10-CM | POA: Insufficient documentation

## 2011-07-09 DIAGNOSIS — R5381 Other malaise: Secondary | ICD-10-CM | POA: Insufficient documentation

## 2011-07-09 DIAGNOSIS — M6281 Muscle weakness (generalized): Secondary | ICD-10-CM | POA: Insufficient documentation

## 2011-07-09 DIAGNOSIS — IMO0001 Reserved for inherently not codable concepts without codable children: Secondary | ICD-10-CM | POA: Insufficient documentation

## 2011-07-09 MED ORDER — ESOMEPRAZOLE MAGNESIUM 40 MG PO CPDR: 40.0000 mg | DELAYED_RELEASE_CAPSULE | Freq: Every day | ORAL | Status: DC

## 2011-07-09 NOTE — Telephone Encounter (Signed)
Patient advised as instructed via telephone. 

## 2011-07-09 NOTE — Telephone Encounter (Signed)
Noted but I cannot write a letter stating that she needs to receive disability. She would have to go through a physician that does that.

## 2011-07-09 NOTE — Telephone Encounter (Signed)
Patient called to let Dr. Dayton Martes know that she is having some type of procedure/injection tomorrow at the physical therapy office.  They will be faxing over an authorization form that needs to be signed before any procedures are done.  Also patient is applying for disability and she wanted to let us know that the Social Security Disability Office will be requesting some of her medical records.

## 2011-07-09 NOTE — Telephone Encounter (Signed)
Left message on machine at home for patient to return call. 

## 2011-07-10 ENCOUNTER — Ambulatory Visit: Payer: BC Managed Care – PPO | Admitting: Physical Therapy

## 2011-07-10 ENCOUNTER — Telehealth: Payer: Self-pay | Admitting: Family Medicine

## 2011-07-10 NOTE — Telephone Encounter (Signed)
rx in my box

## 2011-07-10 NOTE — Telephone Encounter (Signed)
Amy Dutch Quint Therapist from [facility] called to say that they are recommending a Iontophoresis Pain Patch for [place] . Would you please write a RX for this and fax it to Amy Pooles Attn at [phone]. It is an antiinflamatory that will help with her hip pain. She has called here twice already asking if you have sent the RX yet. Typically their recommendation for this is in the plan of care that they havent even typed up yet to send you. The next appt is for Monday with Amy. Their phone # is [phone] if you have any questions.

## 2011-07-10 NOTE — Telephone Encounter (Signed)
Faxed Rx to [facility] at [phone].

## 2011-07-14 ENCOUNTER — Ambulatory Visit: Payer: BC Managed Care – PPO | Admitting: Physical Therapy

## 2011-07-15 ENCOUNTER — Ambulatory Visit: Payer: BC Managed Care – PPO

## 2011-07-16 ENCOUNTER — Other Ambulatory Visit: Payer: Self-pay | Admitting: *Deleted

## 2011-07-17 MED ORDER — TRAMADOL HCL 50 MG PO TABS: 50.0000 mg | ORAL_TABLET | Freq: Three times a day (TID) | ORAL | Status: DC | PRN

## 2011-07-21 ENCOUNTER — Ambulatory Visit: Payer: BC Managed Care – PPO | Attending: Family Medicine

## 2011-07-21 DIAGNOSIS — M6281 Muscle weakness (generalized): Secondary | ICD-10-CM | POA: Insufficient documentation

## 2011-07-21 DIAGNOSIS — R262 Difficulty in walking, not elsewhere classified: Secondary | ICD-10-CM | POA: Insufficient documentation

## 2011-07-21 DIAGNOSIS — M25559 Pain in unspecified hip: Secondary | ICD-10-CM | POA: Insufficient documentation

## 2011-07-21 DIAGNOSIS — IMO0001 Reserved for inherently not codable concepts without codable children: Secondary | ICD-10-CM | POA: Insufficient documentation

## 2011-07-21 DIAGNOSIS — R5381 Other malaise: Secondary | ICD-10-CM | POA: Insufficient documentation

## 2011-07-23 ENCOUNTER — Ambulatory Visit: Payer: BC Managed Care – PPO

## 2011-07-23 ENCOUNTER — Telehealth: Payer: Self-pay | Admitting: *Deleted

## 2011-07-23 NOTE — Telephone Encounter (Signed)
Order faxed to [phone].

## 2011-07-23 NOTE — Telephone Encounter (Signed)
Spoke with receptionist at [facility] and was advised that we need to fax an order to [phone] stating that we wish for patient to continue PT and the duration.  Patient has an appt today with PT and feels that PT is helping.  Please advise.

## 2011-07-23 NOTE — Telephone Encounter (Signed)
OK to fax an order stating this and to use my signature stamp as I will not be in the office until Monday. Thanks

## 2011-07-23 NOTE — Telephone Encounter (Signed)
Patient is requesting addition physical therapy sessions.  She stated that PT is helping with her hip/leg pain but she needs more sessions.  She is requesting at least four more sessions, more if possible.  Patient stated that she has a job interview scheduled but does not want to continue walking with a cane.

## 2011-07-23 NOTE — Telephone Encounter (Signed)
Please contact her PT and find out what type of order they need from [place] and if they feel she would benefit from more sessions.

## 2011-07-27 ENCOUNTER — Ambulatory Visit: Payer: BC Managed Care – PPO | Admitting: Physical Therapy

## 2011-07-29 ENCOUNTER — Ambulatory Visit: Payer: BC Managed Care – PPO | Admitting: Physical Therapy

## 2011-08-03 ENCOUNTER — Other Ambulatory Visit: Payer: Self-pay | Admitting: *Deleted

## 2011-08-03 ENCOUNTER — Ambulatory Visit: Payer: BC Managed Care – PPO

## 2011-08-03 MED ORDER — TEMAZEPAM 30 MG PO CAPS: ORAL_CAPSULE | ORAL | Status: DC

## 2011-08-03 NOTE — Telephone Encounter (Signed)
Last refill 07/03/2011.

## 2011-08-03 NOTE — Telephone Encounter (Signed)
Rx called to [facility]. 

## 2011-08-05 ENCOUNTER — Ambulatory Visit (INDEPENDENT_AMBULATORY_CARE_PROVIDER_SITE_OTHER): Payer: BC Managed Care – PPO | Admitting: Family Medicine

## 2011-08-05 ENCOUNTER — Encounter: Payer: Self-pay | Admitting: Family Medicine

## 2011-08-05 VITALS — BP 110/70 | HR 108 | Temp 99.3°F | Wt 201.2 lb

## 2011-08-05 DIAGNOSIS — N951 Menopausal and female climacteric states: Secondary | ICD-10-CM

## 2011-08-05 DIAGNOSIS — M169 Osteoarthritis of hip, unspecified: Secondary | ICD-10-CM

## 2011-08-05 DIAGNOSIS — Z23 Encounter for immunization: Secondary | ICD-10-CM

## 2011-08-05 DIAGNOSIS — M1610 Unilateral primary osteoarthritis, unspecified hip: Secondary | ICD-10-CM

## 2011-08-05 NOTE — Progress Notes (Signed)
Subjective:    Patient ID: Emily Moon, female    DOB: 1960/12/23, 50 y.o.   MRN: [id number]  HPI 50 yo with complicated medical history, including bipolar disorder, progressive MS here OA hip here for follow up.    OA left hip- saw Dr. Farris Has, has received two steroid injections of hip, most recently this week. Not helping much.  Getting more relief from accupunctuer.   On Hydrocodone, not prescribed by myself.  Still followed by Dr. Epimenio Foot, neurology.  Perimenopause- wants to go bioidenticals.  Feels like she is going through menopause.  Periods are regular but heavier, more breast tenderness and moodiness.  Patient Active Problem List  Diagnoses  . OTHER AND UNSPECIFIED BIPOLAR DISORDERS  . DEPRESSION  . MULTIPLE SCLEROSIS, PROGRESSIVE/RELAPSING  . GERD  . OSTEOARTHRITIS  . SYNCOPE  . NEPHROLITHIASIS, HX OF  . RENAL CALCULUS, RECURRENT  . Chronic pain syndrome  . PALPITATIONS  . HYPERLIPIDEMIA  . SMOKER  . Well woman exam with routine gynecological exam  . Perimenopausal vasomotor symptoms  . Leg cramps  . Obesity  . Sinusitis  . Left hip pain  . Osteoarthritis of hip   Past Medical History  Diagnosis Date  . Bipolar disorder   . Multiple sclerosis   . Depression   . GERD (gastroesophageal reflux disease)   . Nephrolithiasis   . Osteoporosis     osteoarthritis  . Fibromyalgia syndrome    Past Surgical History  Procedure Date  . Tonsillectomy    History  Substance Use Topics  . Smoking status: Current Everyday Smoker  . Smokeless tobacco: Not on file  . Alcohol Use: No   Family History  Problem Relation Age of Onset  . Cancer Father    Allergies  Allergen Reactions  . Ultram (Tramadol Hcl)   . Aripiprazole   . Cefaclor   . Diclofenac Sodium   . Ibuprofen   . Naproxen Sodium    Current Outpatient Prescriptions on File Prior to Visit  Medication Sig Dispense Refill  . clobetasol (TEMOVATE) 0.05 % cream Apply 1 application topically 2 (two)  times daily.        . diclofenac sodium (VOLTAREN) 1 % GEL One application two to four times daily as needed       . DULoxetine (CYMBALTA) 60 MG capsule Take 60 mg by mouth 2 (two) times daily.        Marland Kitchen esomeprazole (NEXIUM) 40 MG capsule Take 1 capsule (40 mg total) by mouth daily.  30 capsule  6  . fluocinolone (VANOS) 0.01 % cream Apply topically 2 (two) times daily.        . Fluticasone-Salmeterol (ADVAIR DISKUS) 100-50 MCG/DOSE AEPB Inhale 1 puff into the lungs 2 (two) times daily.        . hydrochlorothiazide 25 MG tablet Take 25 mg by mouth daily.        . hydrOXYzine (ATARAX) 25 MG tablet Take 25 mg by mouth daily.        Marland Kitchen lamoTRIgine (LAMICTAL) 150 MG tablet Take two tablets by mouth daily       . levalbuterol (XOPENEX HFA) 45 MCG/ACT inhaler Inhale 1-2 puffs into the lungs every 4 (four) hours as needed.        . lidocaine (LIDODERM) 5 % Place 2 patches onto the skin daily. Remove & Discard patch within 12 hours or as directed by MD       . methylphenidate (RITALIN) 10 MG tablet Take 1 tablet (10 mg total)  by mouth 2 (two) times daily.  60 tablet  0  . temazepam (RESTORIL) 30 MG capsule Take one to two tablets by mouth at bedtime  60 capsule  0  . traMADol (ULTRAM) 50 MG tablet Take 1 tablet (50 mg total) by mouth every 8 (eight) hours as needed for pain.  90 tablet  0      The PMH, PSH, Social History, Family History, Medications, and allergies have been reviewed in [facility], and have been updated if relevant.     Review of Systems       See HPI Objective:   Physical Exam BP 110/70  Pulse 108  Temp(Src) 99.3 F (37.4 C) (Oral)  Wt 201 lb 4 oz (91.286 kg)  General:  alert, very talkative, seems to have diffiiculty focusing on one topic. Resp:  CTA bilaterally CVS:  RRR Psych:  Cognition and judgment appear intact. Alert and cooperative with normal attention span and concentration. No apparent delusions, illusions, hallucinations Neuro:  Difficulty concentrating during our  office visit today, grip strength normal bilaterally, having difficulty pushing buttons and phone, other fine motor skills.  Assessment and Plan:  1. Osteoarthritis of hip  >25 min spent with face to face with patient, >50% counseling and/or coordinating care. Followed by ortho, continue with current therapy. Considering hip replacement. Awaiting records.     2. Perimenopausal vasomotor symptoms   Deteriorated. Spoke at length with pt about this issue.  She has a uterus and she is a smoker so unopposed estrogen is contraindicated. I do not feel comfortable with bioidenticals either since they are not FDA regulated. The patient indicates understanding of these issues and agrees with the plan.

## 2011-08-06 ENCOUNTER — Other Ambulatory Visit: Payer: Self-pay | Admitting: *Deleted

## 2011-08-06 ENCOUNTER — Ambulatory Visit: Payer: BC Managed Care – PPO | Admitting: Physical Therapy

## 2011-08-06 MED ORDER — PANTOPRAZOLE SODIUM 40 MG PO TBEC: 40.0000 mg | DELAYED_RELEASE_TABLET | Freq: Every day | ORAL | Status: DC

## 2011-08-06 NOTE — Telephone Encounter (Signed)
Received faxed from [facility] requesting to change Nexium to Generic Protonix.  Patients insurance has went up to $200 for Nexium, please advise.

## 2011-08-06 NOTE — Telephone Encounter (Signed)
Ok to change

## 2011-08-07 ENCOUNTER — Telehealth: Payer: Self-pay | Admitting: *Deleted

## 2011-08-07 NOTE — Telephone Encounter (Signed)
Patient called to see if we have received paper work for her to have a permanent tens unit.  She stated that [facility] stated that they faxed the paper work to our office weeks ago.  I advised patient that we haven't received any paper work and will call to follow up on it.  Called [facility] rehab and they are only open on Friday from 7am til 2pm.  I will call them back on Monday.  [phone].

## 2011-08-10 NOTE — Telephone Encounter (Signed)
Spoke with receptionist at [facility] rehab and she stated that an order was faxed to our office last week for a tens unit.  She will refax the order to my attention to [phone].  Dr. Dayton Martes just needs to review, sign, and date the order she she agrees with patient having a tens unit.

## 2011-08-10 NOTE — Telephone Encounter (Signed)
Form faxed to [phone], patient notified.

## 2011-08-10 NOTE — Telephone Encounter (Signed)
Order in your IN box.

## 2011-08-10 NOTE — Telephone Encounter (Signed)
In my box

## 2011-08-11 ENCOUNTER — Ambulatory Visit: Payer: BC Managed Care – PPO | Admitting: Physical Therapy

## 2011-08-13 ENCOUNTER — Ambulatory Visit: Payer: BC Managed Care – PPO | Admitting: Physical Therapy

## 2011-08-17 ENCOUNTER — Other Ambulatory Visit: Payer: Self-pay | Admitting: *Deleted

## 2011-08-17 MED ORDER — TRAMADOL HCL 50 MG PO TABS: 50.0000 mg | ORAL_TABLET | Freq: Three times a day (TID) | ORAL | Status: DC | PRN

## 2011-08-17 NOTE — Telephone Encounter (Signed)
Patient is requesting a refill on Tramadol.  She stated that [facility] faxed over a refill request on 08/14/2011 but there is no request in the computer, please advise.

## 2011-08-17 NOTE — Telephone Encounter (Signed)
Have sent in.  Just received message now.  Will route to PCP as fyi.  Ultram on allergy list but pt requesting refill of tramadol.

## 2011-09-01 ENCOUNTER — Ambulatory Visit (INDEPENDENT_AMBULATORY_CARE_PROVIDER_SITE_OTHER): Payer: BC Managed Care – PPO | Admitting: Family Medicine

## 2011-09-01 ENCOUNTER — Encounter: Payer: Self-pay | Admitting: Family Medicine

## 2011-09-01 VITALS — BP 130/90 | HR 108 | Temp 100.1°F | Ht 69.0 in | Wt 191.8 lb

## 2011-09-01 DIAGNOSIS — R300 Dysuria: Secondary | ICD-10-CM

## 2011-09-01 DIAGNOSIS — R509 Fever, unspecified: Secondary | ICD-10-CM

## 2011-09-01 DIAGNOSIS — G4700 Insomnia, unspecified: Secondary | ICD-10-CM

## 2011-09-01 LAB — POCT URINALYSIS DIPSTICK
Bilirubin, UA: NEGATIVE
Nitrite, UA: NEGATIVE
pH, UA: 7

## 2011-09-01 MED ORDER — ZALEPLON 10 MG PO CAPS: 10.0000 mg | ORAL_CAPSULE | Freq: Every day | ORAL | Status: AC

## 2011-09-01 MED ORDER — CIPROFLOXACIN HCL 250 MG PO TABS: 250.0000 mg | ORAL_TABLET | Freq: Two times a day (BID) | ORAL | Status: AC

## 2011-09-01 MED ORDER — TEMAZEPAM 30 MG PO CAPS: ORAL_CAPSULE | ORAL | Status: DC

## 2011-09-01 NOTE — Patient Instructions (Signed)
Good to see you. Please keep me posted with the Sonata.

## 2011-09-01 NOTE — Progress Notes (Signed)
Subjective:    Patient ID: Emily Moon, female    DOB: [birth year], 50 y.o.   MRN: [id number]  HPI 50 yo with complicated medical history, including bipolar disorder, progressive MS here for fever and insomnia.  Fever- low grade.  Denies any URI symptoms.  No dysuria but she is urinating frequently.  Insomnia- has had difficulty for years but now worse since she is withdrawing again from narcotics. Has tried [place] in past with no relief of symptoms. Atarax no longer helping. Can fall asleep but wakes up 1-2 hours later.  Patient Active Problem List  Diagnoses  . OTHER AND UNSPECIFIED BIPOLAR DISORDERS  . DEPRESSION  . MULTIPLE SCLEROSIS, PROGRESSIVE/RELAPSING  . GERD  . OSTEOARTHRITIS  . SYNCOPE  . NEPHROLITHIASIS, HX OF  . RENAL CALCULUS, RECURRENT  . Chronic pain syndrome  . PALPITATIONS  . HYPERLIPIDEMIA  . SMOKER  . Well woman exam with routine gynecological exam  . Perimenopausal vasomotor symptoms  . Leg cramps  . Obesity  . Sinusitis  . Left hip pain  . Osteoarthritis of hip  . Fever   Past Medical History  Diagnosis Date  . Bipolar disorder   . Multiple sclerosis   . Depression   . GERD (gastroesophageal reflux disease)   . Nephrolithiasis   . Osteoporosis     osteoarthritis  . Fibromyalgia syndrome    Past Surgical History  Procedure Date  . Tonsillectomy    History  Substance Use Topics  . Smoking status: Current Everyday Smoker  . Smokeless tobacco: Not on file  . Alcohol Use: No   Family History  Problem Relation Age of Onset  . Cancer Father    Allergies  Allergen Reactions  . Ultram (Tramadol Hcl)   . Aripiprazole   . Cefaclor   . Diclofenac Sodium   . Ibuprofen   . Naproxen Sodium    Current Outpatient Prescriptions on File Prior to Visit  Medication Sig Dispense Refill  . clobetasol (TEMOVATE) 0.05 % cream Apply 1 application topically 2 (two) times daily.        . diclofenac sodium (VOLTAREN) 1 % GEL One  application two to four times daily as needed       . DULoxetine (CYMBALTA) 60 MG capsule Take 60 mg by mouth 2 (two) times daily.        Marland Kitchen esomeprazole (NEXIUM) 40 MG capsule Take 1 capsule (40 mg total) by mouth daily.  30 capsule  6  . fluocinolone (VANOS) 0.01 % cream Apply topically 2 (two) times daily.        . Fluticasone-Salmeterol (ADVAIR DISKUS) 100-50 MCG/DOSE AEPB Inhale 1 puff into the lungs 2 (two) times daily.        . hydrochlorothiazide 25 MG tablet Take 25 mg by mouth daily.        . hydrOXYzine (ATARAX) 25 MG tablet Take 25 mg by mouth daily.        Marland Kitchen lamoTRIgine (LAMICTAL) 150 MG tablet Take two tablets by mouth daily       . levalbuterol (XOPENEX HFA) 45 MCG/ACT inhaler Inhale 1-2 puffs into the lungs every 4 (four) hours as needed.        . lidocaine (LIDODERM) 5 % Place 2 patches onto the skin daily. Remove & Discard patch within 12 hours or as directed by MD       . methylphenidate (RITALIN) 10 MG tablet Take 1 tablet (10 mg total) by mouth 2 (two) times daily.  60 tablet  0  . pantoprazole (PROTONIX) 40 MG tablet Take 1 tablet (40 mg total) by mouth daily.  30 tablet  2  . traMADol (ULTRAM) 50 MG tablet Take 1 tablet (50 mg total) by mouth every 8 (eight) hours as needed for pain.  90 tablet  0      The PMH, PSH, Social History, Family History, Medications, and allergies have been reviewed in [facility], and have been updated if relevant.     Review of Systems       See HPI Objective:   Physical Exam BP 130/90  Pulse 108  Temp(Src) 100.1 F (37.8 C) (Oral)  Ht 5\' 9"  (1.753 m)  Wt 191 lb 12 oz (86.977 kg)  BMI 28.32 kg/m2  General:  alert, very talkative, seems to have diffiiculty focusing on one topic. Resp:  CTA bilaterally CVS:  RRR Abd:  Soft, NT, no CVA tenderness Psych:  Cognition and judgment appear intact. Alert and cooperative with normal attention span and concentration. No apparent delusions, illusions, hallucinations Neuro:  Difficulty concentrating  during our office visit today, grip strength normal bilaterally, having difficulty pushing buttons and phone, other fine motor skills.  Assessment and Plan:  1. Insomnia    Deteriorated. >25 min spent with face to face with patient, >50% counseling and/or coordinating care. Will try Sonata.  Discussed sleep hygiene. The patient indicates understanding of these issues and agrees with the plan.   2. Fever      New.  UA positive for blood. Will treat with cipro and send urine for cx.

## 2011-09-03 ENCOUNTER — Ambulatory Visit (INDEPENDENT_AMBULATORY_CARE_PROVIDER_SITE_OTHER): Payer: BC Managed Care – PPO | Admitting: Psychiatry

## 2011-09-03 DIAGNOSIS — F17200 Nicotine dependence, unspecified, uncomplicated: Secondary | ICD-10-CM

## 2011-09-03 DIAGNOSIS — F319 Bipolar disorder, unspecified: Secondary | ICD-10-CM

## 2011-09-03 DIAGNOSIS — F429 Obsessive-compulsive disorder, unspecified: Secondary | ICD-10-CM

## 2011-09-07 ENCOUNTER — Ambulatory Visit (INDEPENDENT_AMBULATORY_CARE_PROVIDER_SITE_OTHER): Payer: BC Managed Care – PPO | Admitting: Psychiatry

## 2011-09-07 ENCOUNTER — Encounter: Payer: Self-pay | Admitting: Family Medicine

## 2011-09-07 ENCOUNTER — Ambulatory Visit (INDEPENDENT_AMBULATORY_CARE_PROVIDER_SITE_OTHER): Payer: BC Managed Care – PPO | Admitting: Family Medicine

## 2011-09-07 VITALS — BP 130/80 | HR 96 | Temp 99.8°F | Ht 69.0 in | Wt 197.0 lb

## 2011-09-07 DIAGNOSIS — F17200 Nicotine dependence, unspecified, uncomplicated: Secondary | ICD-10-CM

## 2011-09-07 DIAGNOSIS — F429 Obsessive-compulsive disorder, unspecified: Secondary | ICD-10-CM

## 2011-09-07 DIAGNOSIS — F319 Bipolar disorder, unspecified: Secondary | ICD-10-CM

## 2011-09-07 DIAGNOSIS — R509 Fever, unspecified: Secondary | ICD-10-CM

## 2011-09-07 DIAGNOSIS — G894 Chronic pain syndrome: Secondary | ICD-10-CM

## 2011-09-07 NOTE — Progress Notes (Signed)
Subjective:    Patient ID: Emily Moon, female    DOB: 17-Oct-1961, 50 y.o.   MRN: [id number]  HPI 50 yo with complicated medical history, including bipolar disorder, progressive MS here for fever and insomnia.  Fever- low grade.  Denies any URI symptoms.  No dysuria or increased urinary frequency. Urine cx recently negative.  Insomnia- has had difficulty for years but now worse since she is withdrawing again from narcotics. Has tried [place] in past with no relief of symptoms. Atarax no longer helping. Can fall asleep but wakes up 1-2 hours later. Given rx for sonata at last office visit, has not tried it. Increased stressors in her life, fighting with her sister about money. Thinking of moving.  Patient Active Problem List  Diagnoses  . OTHER AND UNSPECIFIED BIPOLAR DISORDERS  . DEPRESSION  . MULTIPLE SCLEROSIS, PROGRESSIVE/RELAPSING  . GERD  . OSTEOARTHRITIS  . SYNCOPE  . NEPHROLITHIASIS, HX OF  . RENAL CALCULUS, RECURRENT  . Chronic pain syndrome  . PALPITATIONS  . HYPERLIPIDEMIA  . SMOKER  . Well woman exam with routine gynecological exam  . Perimenopausal vasomotor symptoms  . Leg cramps  . Obesity  . Sinusitis  . Left hip pain  . Osteoarthritis of hip  . Fever  . Insomnia   Past Medical History  Diagnosis Date  . Bipolar disorder   . Multiple sclerosis   . Depression   . GERD (gastroesophageal reflux disease)   . Nephrolithiasis   . Osteoporosis     osteoarthritis  . Fibromyalgia syndrome    Past Surgical History  Procedure Date  . Tonsillectomy    History  Substance Use Topics  . Smoking status: Current Everyday Smoker  . Smokeless tobacco: Not on file  . Alcohol Use: No   Family History  Problem Relation Age of Onset  . Cancer Father    Allergies  Allergen Reactions  . Ultram (Tramadol Hcl)   . Aripiprazole   . Cefaclor   . Diclofenac Sodium   . Ibuprofen   . Naproxen Sodium    Current Outpatient Prescriptions on File  Prior to Visit  Medication Sig Dispense Refill  . ciprofloxacin (CIPRO) 250 MG tablet Take 1 tablet (250 mg total) by mouth 2 (two) times daily.  14 tablet  0  . clobetasol (TEMOVATE) 0.05 % cream Apply 1 application topically 2 (two) times daily.        . diclofenac sodium (VOLTAREN) 1 % GEL One application two to four times daily as needed       . DULoxetine (CYMBALTA) 60 MG capsule Take 60 mg by mouth 2 (two) times daily.        Marland Kitchen esomeprazole (NEXIUM) 40 MG capsule Take 1 capsule (40 mg total) by mouth daily.  30 capsule  6  . fluocinolone (VANOS) 0.01 % cream Apply topically 2 (two) times daily.        . Fluticasone-Salmeterol (ADVAIR DISKUS) 100-50 MCG/DOSE AEPB Inhale 1 puff into the lungs 2 (two) times daily.        . hydrochlorothiazide 25 MG tablet Take 25 mg by mouth daily.        . hydrOXYzine (ATARAX) 25 MG tablet Take 25 mg by mouth daily.        Marland Kitchen lamoTRIgine (LAMICTAL) 150 MG tablet Take two tablets by mouth daily       . levalbuterol (XOPENEX HFA) 45 MCG/ACT inhaler Inhale 1-2 puffs into the lungs every 4 (four) hours as needed.        Marland Kitchen  lidocaine (LIDODERM) 5 % Place 2 patches onto the skin daily. Remove & Discard patch within 12 hours or as directed by MD       . methylphenidate (RITALIN) 10 MG tablet Take 1 tablet (10 mg total) by mouth 2 (two) times daily.  60 tablet  0  . pantoprazole (PROTONIX) 40 MG tablet Take 1 tablet (40 mg total) by mouth daily.  30 tablet  2  . temazepam (RESTORIL) 30 MG capsule Take one to two tablets by mouth at bedtime  60 capsule  0  . traMADol (ULTRAM) 50 MG tablet Take 1 tablet (50 mg total) by mouth every 8 (eight) hours as needed for pain.  90 tablet  0  . zaleplon (SONATA) 10 MG capsule Take 1 capsule (10 mg total) by mouth at bedtime.  30 capsule  0      The PMH, PSH, Social History, Family History, Medications, and allergies have been reviewed in [facility], and have been updated if relevant.     Review of Systems       See HPI Objective:     Physical Exam BP 130/80  Pulse 96  Temp(Src) 99.8 F (37.7 C) (Oral)  Ht 5\' 9"  (1.753 m)  Wt 197 lb (89.359 kg)  BMI 29.09 kg/m2  LMP 09/01/2011  General:  alert, very talkative, seems to have diffiiculty focusing on one topic. Resp:  CTA bilaterally CVS:  RRR Abd:  Soft, NT, no CVA tenderness Psych:  Cognition and judgment appear intact. Alert and cooperative with normal attention span and concentration. No apparent delusions, illusions, hallucinations Neuro:  Difficulty concentrating during our office visit today, grip strength normal bilaterally, having difficulty pushing buttons and phone, other fine motor skills.  Assessment and Plan:  1. Insomnia    Deteriorated. >25 min spent with face to face with patient, >50% counseling and/or coordinating care. Advised to refill her Sonatal  Discussed sleep hygiene. Pt has follow up with her psychologist scheduled for tomorrow. The patient indicates understanding of these issues and agrees with the plan.   2. Fever     ?elevated basal temp due to medication as she has no signs of symptoms of infection. Looking back at previous VS, Temp is typically in this range. Cont to observe.

## 2011-09-08 ENCOUNTER — Other Ambulatory Visit: Payer: Self-pay | Admitting: *Deleted

## 2011-09-08 MED ORDER — METHYLPHENIDATE HCL 10 MG PO TABS: 10.0000 mg | ORAL_TABLET | Freq: Two times a day (BID) | ORAL | Status: DC

## 2011-09-08 MED ORDER — HYDROXYZINE HCL 25 MG PO TABS: 25.0000 mg | ORAL_TABLET | Freq: Every day | ORAL | Status: DC

## 2011-09-08 MED ORDER — HYDROCHLOROTHIAZIDE 25 MG PO TABS: 25.0000 mg | ORAL_TABLET | Freq: Every day | ORAL | Status: DC

## 2011-09-08 MED ORDER — LAMOTRIGINE 150 MG PO TABS: ORAL_TABLET | ORAL | Status: DC

## 2011-09-09 ENCOUNTER — Other Ambulatory Visit: Payer: Self-pay | Admitting: *Deleted

## 2011-09-09 MED ORDER — TIZANIDINE HCL 4 MG PO TABS: 4.0000 mg | ORAL_TABLET | Freq: Four times a day (QID) | ORAL | Status: DC | PRN

## 2011-09-09 MED ORDER — TEMAZEPAM 30 MG PO CAPS: ORAL_CAPSULE | ORAL | Status: DC

## 2011-09-09 MED ORDER — ESOMEPRAZOLE MAGNESIUM 40 MG PO CPDR: 40.0000 mg | DELAYED_RELEASE_CAPSULE | Freq: Every day | ORAL | Status: DC

## 2011-09-09 MED ORDER — VENLAFAXINE HCL 25 MG PO TABS: 25.0000 mg | ORAL_TABLET | Freq: Two times a day (BID) | ORAL | Status: DC

## 2011-09-09 MED ORDER — METHYLPHENIDATE HCL 10 MG PO TABS: 10.0000 mg | ORAL_TABLET | Freq: Two times a day (BID) | ORAL | Status: DC

## 2011-09-09 NOTE — Telephone Encounter (Signed)
Patient notified via telephone.  Rx for Ritalin ready for pick up will be left at front desk.  Patient does not take Effexor it was an entry error, will call [facility] and cancel that Rx.

## 2011-09-09 NOTE — Telephone Encounter (Signed)
Patient is requesting a refill on these medications to be filled at [facility].

## 2011-09-09 NOTE — Telephone Encounter (Signed)
Rx for Temazepam called to [facility].

## 2011-09-10 ENCOUNTER — Telehealth: Payer: Self-pay | Admitting: *Deleted

## 2011-09-10 NOTE — Telephone Encounter (Signed)
Received fax from [facility] needing clarification on Hydroxyzine.  Form in your IN box.

## 2011-09-10 NOTE — Telephone Encounter (Signed)
In my box

## 2011-09-11 NOTE — Telephone Encounter (Signed)
Completed form faxed to [facility] at [phone].

## 2011-09-14 ENCOUNTER — Ambulatory Visit (INDEPENDENT_AMBULATORY_CARE_PROVIDER_SITE_OTHER): Payer: BC Managed Care – PPO | Admitting: Psychiatry

## 2011-09-14 ENCOUNTER — Other Ambulatory Visit: Payer: Self-pay | Admitting: *Deleted

## 2011-09-14 DIAGNOSIS — F319 Bipolar disorder, unspecified: Secondary | ICD-10-CM

## 2011-09-14 DIAGNOSIS — F17200 Nicotine dependence, unspecified, uncomplicated: Secondary | ICD-10-CM

## 2011-09-14 DIAGNOSIS — F429 Obsessive-compulsive disorder, unspecified: Secondary | ICD-10-CM

## 2011-09-14 MED ORDER — TRAMADOL HCL 50 MG PO TABS: 50.0000 mg | ORAL_TABLET | Freq: Three times a day (TID) | ORAL | Status: AC | PRN

## 2011-09-14 NOTE — Telephone Encounter (Signed)
OK to refill

## 2011-09-14 NOTE — Telephone Encounter (Signed)
Rx called in as directed.   

## 2011-09-21 ENCOUNTER — Ambulatory Visit: Payer: BC Managed Care – PPO | Admitting: Psychiatry

## 2011-09-28 ENCOUNTER — Other Ambulatory Visit: Payer: Self-pay | Admitting: *Deleted

## 2011-09-28 NOTE — Telephone Encounter (Signed)
Please call pt to find out if she is taking Cymbalta. We can only refill both of these once until she finds a doctor out of state. Hope she is doing well.

## 2011-09-28 NOTE — Telephone Encounter (Signed)
Pt wants refills on temazepam and [facility]ymbalta [facility].  I  dont cymbalta on her med list.  Pt has moved out of state.

## 2011-09-29 MED ORDER — DULOXETINE HCL 60 MG PO CPEP: 60.0000 mg | ORAL_CAPSULE | Freq: Two times a day (BID) | ORAL | Status: DC

## 2011-09-29 MED ORDER — TEMAZEPAM 30 MG PO CAPS: ORAL_CAPSULE | ORAL | Status: DC

## 2011-09-29 NOTE — Telephone Encounter (Signed)
I spoke with patient and she is taking Cymbalta.  She is requesting that the Rx for Temazepam be sent to [facility], a 60-day supply and the Rx for Cymbalta be sent to [facility] for a 90 day supply.  Patient stated that she is in the process of finding a new physician.

## 2011-09-29 NOTE — Telephone Encounter (Signed)
Left message on cell phone voicemail for patient to return call. 

## 2011-09-29 NOTE — Telephone Encounter (Signed)
Rx for Temazepam called to [facility].  Spoke with a rep at [facility] and was advised that prior authorization is needed for Cymbalta.  They will be faxing the PA form today and once they received the completed form it could take 48-72 hours for approval/denial.  Patient advised via telephone.

## 2011-09-30 ENCOUNTER — Telehealth: Payer: Self-pay | Admitting: *Deleted

## 2011-09-30 NOTE — Telephone Encounter (Signed)
Faxed completed PA form to [facility] and gave to W[place].

## 2011-10-26 ENCOUNTER — Other Ambulatory Visit: Payer: Self-pay | Admitting: *Deleted

## 2011-10-26 MED ORDER — DULOXETINE HCL 60 MG PO CPEP: 60.0000 mg | ORAL_CAPSULE | Freq: Two times a day (BID) | ORAL | Status: DC

## 2011-10-26 NOTE — Telephone Encounter (Signed)
Emily Moon called to see if Dr. Dayton Martes is willing to call in a 30 day [facility].  She stated that the prior authorization that we did for her can not be found or her new doctor doesn't have it on file.  Please advise.

## 2011-10-27 NOTE — Telephone Encounter (Signed)
Rx sent in electronically by Dr. Dayton Martes, left message on cell phone voicemail advising patient as instructed.

## 2011-11-17 HISTORY — PX: JOINT REPLACEMENT: SHX530

## 2011-11-18 ENCOUNTER — Other Ambulatory Visit: Payer: Self-pay | Admitting: *Deleted

## 2011-11-18 NOTE — Telephone Encounter (Signed)
Patient called to request refill.  She stated that she has an appt with her doctor on 12/03/2011 and this was the earliest they could see her because she called them back in November for an appt.  Please advise.

## 2011-11-19 MED ORDER — TEMAZEPAM 30 MG PO CAPS: ORAL_CAPSULE | ORAL | Status: DC

## 2011-11-19 NOTE — Telephone Encounter (Signed)
Rx called to [facility], patient notified via telephone.

## 2012-11-15 ENCOUNTER — Telehealth: Payer: Self-pay | Admitting: *Deleted

## 2012-11-15 NOTE — Telephone Encounter (Signed)
Message copied by Eliezer Bottom on Tue Nov 15, 2012  3:53 PM ------      Message from: Dianne Dun      Created: Tue Nov 15, 2012  7:09 AM       Jacki Cones,      Ms. Convey is very well known to this practice but she is very complicated and moved to another state last year. This appointment needs to be a re establishing care visit. Please reschedule this appointment for next week- at least 30 minutes- re establish care and ask pt to have her records sent here from new provider in [place] ASAP--she gets many controlled substances.            Thanks!      Jovita Gamma

## 2012-11-15 NOTE — Telephone Encounter (Signed)
Left message advising patient that we need to reschedule her appt and asked that she please call the office back.

## 2012-11-15 NOTE — Telephone Encounter (Signed)
Appointment rescheduled to next week, but pt states she will need a refill on her ritalin before then.  She has a script from her doctor in [place], but says she cant get use that script in this state.  She's asking if she can bring that script in and trade it for a script from you.  I told her I would ask you and let her know on Thursday.  I also told her that you need her medical records from that doctor.  She said she can get those.

## 2012-11-17 NOTE — Telephone Encounter (Signed)
Yes if she brings in that script, I will refill it.

## 2012-11-17 NOTE — Telephone Encounter (Signed)
Advised patient, she will bring script tomorrow afternoon.

## 2012-11-18 ENCOUNTER — Ambulatory Visit: Payer: BC Managed Care – PPO | Admitting: Family Medicine

## 2012-11-18 MED ORDER — METHYLPHENIDATE HCL 10 MG PO TABS: 10.0000 mg | ORAL_TABLET | Freq: Two times a day (BID) | ORAL | Status: DC

## 2012-11-22 ENCOUNTER — Ambulatory Visit (INDEPENDENT_AMBULATORY_CARE_PROVIDER_SITE_OTHER): Payer: Self-pay | Admitting: Family Medicine

## 2012-11-22 ENCOUNTER — Encounter: Payer: Self-pay | Admitting: Family Medicine

## 2012-11-22 VITALS — BP 110/60 | HR 92 | Temp 98.6°F | Wt 199.0 lb

## 2012-11-22 DIAGNOSIS — G894 Chronic pain syndrome: Secondary | ICD-10-CM

## 2012-11-22 DIAGNOSIS — G35 Multiple sclerosis: Secondary | ICD-10-CM

## 2012-11-22 DIAGNOSIS — F3289 Other specified depressive episodes: Secondary | ICD-10-CM

## 2012-11-22 DIAGNOSIS — F329 Major depressive disorder, single episode, unspecified: Secondary | ICD-10-CM

## 2012-11-22 DIAGNOSIS — F3189 Other bipolar disorder: Secondary | ICD-10-CM

## 2012-11-22 DIAGNOSIS — E785 Hyperlipidemia, unspecified: Secondary | ICD-10-CM

## 2012-11-22 MED ORDER — HYDROXYZINE HCL 25 MG PO TABS: ORAL_TABLET | ORAL | Status: DC

## 2012-11-22 MED ORDER — HYDROCODONE-ACETAMINOPHEN 2.5-325 MG PO TABS: 1.0000 | ORAL_TABLET | Freq: Three times a day (TID) | ORAL | Status: DC | PRN

## 2012-11-22 MED ORDER — CICLOPIROX & LACQUER REMOVAL 8 % EX KIT: 1.0000 "application " | PACK | Freq: Every day | CUTANEOUS | Status: DC

## 2012-11-22 NOTE — Progress Notes (Signed)
Subjective:    Patient ID: Emily Moon, female    DOB: 25-Feb-1961, 52 y.o.   MRN: [id number]  HPI 52 yo with complicated medical history, including bipolar disorder, progressive MS here to re establish care. Moved to [place] last year.  Does not have insurance- wants to only refill medications at this visit.  Cannot afford physical exam or lab work today.  Chronic pain- improved since she had her hip replacement this year.  Still has back pain from lifting her mother. Moved back here to help take care of her mom with progressive dementia and her sister who had a recent lung lobectomy.     Patient Active Problem List  Diagnosis  . OTHER AND UNSPECIFIED BIPOLAR DISORDERS  . DEPRESSION  . MULTIPLE SCLEROSIS, PROGRESSIVE/RELAPSING  . GERD  . OSTEOARTHRITIS  . SYNCOPE  . NEPHROLITHIASIS, HX OF  . RENAL CALCULUS, RECURRENT  . Chronic pain syndrome  . PALPITATIONS  . HYPERLIPIDEMIA  . SMOKER  . Well woman exam with routine gynecological exam  . Perimenopausal vasomotor symptoms  . Leg cramps  . Obesity  . Sinusitis  . Left hip pain  . Osteoarthritis of hip  . Fever  . Insomnia   Past Medical History  Diagnosis Date  . Bipolar disorder   . Multiple sclerosis   . Depression   . GERD (gastroesophageal reflux disease)   . Nephrolithiasis   . Osteoporosis     osteoarthritis  . Fibromyalgia syndrome    Past Surgical History  Procedure Date  . Tonsillectomy    History  Substance Use Topics  . Smoking status: Former Games developer  . Smokeless tobacco: Not on file     Comment: Quit 06/2012  . Alcohol Use: No   Family History  Problem Relation Age of Onset  . Cancer Father    Allergies  Allergen Reactions  . Ultram (Tramadol Hcl)   . Aripiprazole   . Cefaclor   . Diclofenac Sodium   . Ibuprofen   . Naproxen Sodium    Current Outpatient Prescriptions on File Prior to Visit  Medication Sig Dispense Refill  . DULoxetine (CYMBALTA) 60 MG capsule Take 1 capsule (60  mg total) by mouth 2 (two) times daily.  180 capsule  0  . esomeprazole (NEXIUM) 40 MG capsule Take 1 capsule (40 mg total) by mouth daily.  90 capsule  3  . fluocinolone (VANOS) 0.01 % cream Apply topically 2 (two) times daily.        . Fluticasone-Salmeterol (ADVAIR DISKUS) 100-50 MCG/DOSE AEPB Inhale 1 puff into the lungs 2 (two) times daily.        . hydrochlorothiazide (HYDRODIURIL) 25 MG tablet Take 1 tablet (25 mg total) by mouth daily.  90 tablet  3  . hydrOXYzine (ATARAX/VISTARIL) 25 MG tablet Take 1 tablet (25 mg total) by mouth daily.  90 tablet  3  . lamoTRIgine (LAMICTAL) 150 MG tablet Take two tablets by mouth daily  180 tablet  3  . levalbuterol (XOPENEX HFA) 45 MCG/ACT inhaler Inhale 1-2 puffs into the lungs every 4 (four) hours as needed.        . lidocaine (LIDODERM) 5 % Place 2 patches onto the skin daily. Remove & Discard patch within 12 hours or as directed by MD       . methylphenidate (RITALIN) 10 MG tablet Take 1 tablet (10 mg total) by mouth 2 (two) times daily.  60 tablet  0  . pantoprazole (PROTONIX) 40 MG tablet Take 1 tablet (40  mg total) by mouth daily.  30 tablet  2  . temazepam (RESTORIL) 30 MG capsule Take one to two tablets by mouth at bedtime  60 capsule  0  . tiZANidine (ZANAFLEX) 4 MG tablet Take 1 tablet (4 mg total) by mouth every 6 (six) hours as needed.  30 tablet  0      The PMH, PSH, Social History, Family History, Medications, and allergies have been reviewed in [facility], and have been updated if relevant.     Review of Systems       See HPI Objective:   Physical Exam BP 110/60  Pulse 92  Temp 98.6 F (37 C)  Wt 199 lb (90.266 kg)  General:  alert, very talkative, seems to have diffiiculty focusing on one topic. Resp:  CTA bilaterally CVS:  RRR Abd:  Soft, NT, no CVA tenderness Psych:  Cognition and judgment appear intact. Alert and cooperative with normal attention span and concentration. No apparent delusions, illusions,  hallucinations Neuro:  Difficulty concentrating during our office visit today, grip strength normal bilaterally, having difficulty pushing buttons and phone, other fine motor skills.  Assessment and Plan: 1. DEPRESSION  Stable on current meds. Given samples of Cymbalta.  Advised that she look into the [facility] assistance program.  2. Chronic pain syndrome  Deteriorated. Refilled norco. Discussed abuse potential given history of narcotic use in past.  She states that she will use sparingly and will sign pain contract.

## 2012-11-22 NOTE — Patient Instructions (Addendum)
Good to see you. Welcome back!  On your way out, ask for information about the [employer].  When you get insurance, please make an appointment for a physical.

## 2012-12-27 ENCOUNTER — Other Ambulatory Visit: Payer: Self-pay

## 2012-12-27 MED ORDER — METHYLPHENIDATE HCL 10 MG PO TABS: 10.0000 mg | ORAL_TABLET | Freq: Two times a day (BID) | ORAL | Status: DC

## 2012-12-27 NOTE — Telephone Encounter (Signed)
Pt left v/m requesting rx Ritalin. Call when ready for pick up. Pt said she knows it is a little early but wants to pick up due to snow storm coming.Please advise.

## 2012-12-27 NOTE — Telephone Encounter (Signed)
Advised patient script will be ready for pick up on Thursday, script is on your desk for signature.

## 2013-01-02 NOTE — Telephone Encounter (Signed)
Script has been picked up.

## 2013-01-02 NOTE — Telephone Encounter (Signed)
Pt left v/m pt will be in office this morning at 10:15 am and would like to pick up Ritalin rx then.Please advise.

## 2013-01-02 NOTE — Telephone Encounter (Signed)
Rx signed and on my desk

## 2013-01-16 ENCOUNTER — Telehealth: Payer: Self-pay | Admitting: *Deleted

## 2013-01-16 MED ORDER — TIZANIDINE HCL 4 MG PO TABS: 4.0000 mg | ORAL_TABLET | Freq: Four times a day (QID) | ORAL | Status: DC | PRN

## 2013-01-16 NOTE — Telephone Encounter (Signed)
Left message on cell phone voice mail advising patient script has been sent to pharmacy.

## 2013-01-16 NOTE — Telephone Encounter (Signed)
Pt lmom requesting refill of zanaflex 4 mg # 60, she is going out of town and needs rx.

## 2013-01-16 NOTE — Telephone Encounter (Deleted)
Patient requests refill on

## 2013-01-27 ENCOUNTER — Telehealth: Payer: Self-pay | Admitting: *Deleted

## 2013-01-27 NOTE — Telephone Encounter (Signed)
Form signed in my box. 

## 2013-01-27 NOTE — Telephone Encounter (Signed)
Pt has brought in a form to refill cymbalta through [employer], form is on your desk.

## 2013-01-27 NOTE — Telephone Encounter (Signed)
Form faxed.  Advised patient.

## 2013-02-10 ENCOUNTER — Other Ambulatory Visit: Payer: Self-pay

## 2013-02-10 MED ORDER — METHYLPHENIDATE HCL 10 MG PO TABS: 10.0000 mg | ORAL_TABLET | Freq: Two times a day (BID) | ORAL | Status: DC

## 2013-02-10 NOTE — Telephone Encounter (Signed)
Pt left v/m requesting rx for Ritalin. Call when ready for pick up.(pt wants to pick up on 02/13/13).

## 2013-02-10 NOTE — Telephone Encounter (Signed)
Ok to print out and put in my box for signature. 

## 2013-02-13 NOTE — Telephone Encounter (Signed)
Left message on voice mail advising patient script is ready for pick up.

## 2013-02-16 ENCOUNTER — Other Ambulatory Visit: Payer: Self-pay | Admitting: Family Medicine

## 2013-02-16 ENCOUNTER — Telehealth: Payer: Self-pay | Admitting: *Deleted

## 2013-02-16 ENCOUNTER — Other Ambulatory Visit: Payer: Self-pay

## 2013-02-16 MED ORDER — HYDROCODONE-ACETAMINOPHEN 2.5-325 MG PO TABS: 1.0000 | ORAL_TABLET | Freq: Three times a day (TID) | ORAL | Status: DC | PRN

## 2013-02-16 MED ORDER — TIZANIDINE HCL 4 MG PO TABS: 4.0000 mg | ORAL_TABLET | Freq: Four times a day (QID) | ORAL | Status: DC | PRN

## 2013-02-16 NOTE — Telephone Encounter (Signed)
Ok to send the 10-325 dose instead.

## 2013-02-16 NOTE — Telephone Encounter (Signed)
Pt had requested refill on norco 2.5-325, which was called [facility], [facility] and that pt had previously been on 10-325.  Please advise.

## 2013-02-16 NOTE — Telephone Encounter (Signed)
Pt request refill on Tizanidine and hydrocodone apap to [facility].Please advise.

## 2013-02-16 NOTE — Telephone Encounter (Signed)
[facility].

## 2013-02-16 NOTE — Telephone Encounter (Signed)
10-325 [facility].  Dose changed on med list.

## 2013-02-17 ENCOUNTER — Telehealth: Payer: Self-pay | Admitting: *Deleted

## 2013-02-17 NOTE — Telephone Encounter (Signed)
Cymbalta received from [facility]- 4 bottles of # 30 each.  Lot number [id number], EXP 05/2014.  Advised patient, she will pick up.

## 2013-03-24 ENCOUNTER — Other Ambulatory Visit: Payer: Self-pay

## 2013-03-24 MED ORDER — METHYLPHENIDATE HCL 10 MG PO TABS: 10.0000 mg | ORAL_TABLET | Freq: Two times a day (BID) | ORAL | Status: DC

## 2013-03-24 NOTE — Telephone Encounter (Signed)
Left voice mail advising patient script is ready for pick up.

## 2013-03-24 NOTE — Telephone Encounter (Signed)
Pt left note requesting rx ritalin. Call when ready for pick up.

## 2013-04-03 ENCOUNTER — Encounter: Payer: Self-pay | Admitting: Family Medicine

## 2013-04-14 ENCOUNTER — Encounter: Payer: Self-pay | Admitting: Family Medicine

## 2013-04-18 ENCOUNTER — Other Ambulatory Visit: Payer: Self-pay

## 2013-04-18 NOTE — Telephone Encounter (Signed)
Pt left v/m requesting refill temazepam to [facility]; pt out of med and request called in today;.Please advise.

## 2013-04-19 MED ORDER — TEMAZEPAM 30 MG PO CAPS: ORAL_CAPSULE | ORAL | Status: DC

## 2013-04-19 NOTE — Telephone Encounter (Signed)
[facility], pt advised.

## 2013-04-20 ENCOUNTER — Other Ambulatory Visit: Payer: Self-pay | Admitting: Family Medicine

## 2013-05-18 ENCOUNTER — Encounter: Payer: Self-pay | Admitting: Family Medicine

## 2013-05-18 ENCOUNTER — Ambulatory Visit (INDEPENDENT_AMBULATORY_CARE_PROVIDER_SITE_OTHER): Payer: BC Managed Care – PPO | Admitting: Family Medicine

## 2013-05-18 VITALS — BP 104/74 | HR 104 | Temp 99.4°F | Wt 210.0 lb

## 2013-05-18 DIAGNOSIS — G894 Chronic pain syndrome: Secondary | ICD-10-CM

## 2013-05-18 DIAGNOSIS — R300 Dysuria: Secondary | ICD-10-CM | POA: Insufficient documentation

## 2013-05-18 DIAGNOSIS — N926 Irregular menstruation, unspecified: Secondary | ICD-10-CM | POA: Insufficient documentation

## 2013-05-18 LAB — POCT URINALYSIS DIPSTICK
Glucose, UA: NEGATIVE
Ketones, UA: NEGATIVE
Protein, UA: NEGATIVE
Spec Grav, UA: 1.01

## 2013-05-18 MED ORDER — SULFAMETHOXAZOLE-TRIMETHOPRIM 800-160 MG PO TABS: 1.0000 | ORAL_TABLET | Freq: Two times a day (BID) | ORAL | Status: DC

## 2013-05-18 MED ORDER — HYDROCODONE-ACETAMINOPHEN 10-325 MG PO TABS
1.0000 | ORAL_TABLET | Freq: Four times a day (QID) | ORAL | Status: DC | PRN
Start: 2013-05-18 — End: 2013-07-12

## 2013-05-18 NOTE — Addendum Note (Signed)
Addended by: Eliezer Bottom on: 05/18/2013 12:45 PM   Modules accepted: Orders

## 2013-05-18 NOTE — Patient Instructions (Addendum)
Good to see you, Emily Moon. Your urine does look positive.  Start taking Bactrim as directed. We will call you next week with your culture results.

## 2013-05-18 NOTE — Progress Notes (Signed)
Subjective:    Patient ID: Emily Moon, female    DOB: [birth year], 52 y.o.   MRN: [id number]  HPI 52 yo with complicated medical history, including bipolar disorder, progressive MS here for several issue.  I have not seen her since she re established care in 11/2012.     Chronic pain- was improved with her hip replacement this year.   Moved back here to help take care of her mom with progressive dementia and her sister who had a recent lung lobectomy but now having "pain all over" since she fell 4 days ago.  Slipped walking out of shower- landed on back, then hit her arms and head.  Dysuria- ongoing for weeks.  Increased frequency.  Has been taking cipro 500 mg she had at home but ran out.  Still burning. No fevers or back pain.     Menstrual problems-  Last few months, increased menstrual irregularity.  Had two periods last month.  Some hot flashes.      Patient Active Problem List   Diagnosis Date Noted  . Dysuria 05/18/2013  . Irregular menstrual cycle 05/18/2013  . Insomnia 09/01/2011  . Osteoarthritis of hip 08/05/2011  . Left hip pain 06/03/2011  . Sinusitis 05/14/2011  . Obesity 05/04/2011  . Leg cramps 04/29/2011  . Well woman exam with routine gynecological exam 02/05/2011  . Perimenopausal vasomotor symptoms 02/05/2011  . HYPERLIPIDEMIA 01/20/2011  . SMOKER 01/20/2011  . PALPITATIONS 01/16/2011  . Chronic pain syndrome 01/07/2011  . RENAL CALCULUS, RECURRENT 11/13/2010  . SYNCOPE 10/23/2010  . OTHER AND UNSPECIFIED BIPOLAR DISORDERS 08/28/2010  . DEPRESSION 08/28/2010  . MULTIPLE SCLEROSIS, PROGRESSIVE/RELAPSING 08/28/2010  . GERD 08/28/2010  . OSTEOARTHRITIS 08/28/2010  . NEPHROLITHIASIS, HX OF 08/28/2010   Past Medical History  Diagnosis Date  . Bipolar disorder   . Multiple sclerosis   . Depression   . GERD (gastroesophageal reflux disease)   . Nephrolithiasis   . Osteoporosis     osteoarthritis  . Fibromyalgia syndrome    Past Surgical History   Procedure Laterality Date  . Tonsillectomy    . Joint replacement      hip replacement   History  Substance Use Topics  . Smoking status: Former Games developer  . Smokeless tobacco: Not on file     Comment: Quit 06/2012  . Alcohol Use: No   Family History  Problem Relation Age of Onset  . Cancer Father    Allergies  Allergen Reactions  . Ultram (Tramadol Hcl)   . Aripiprazole   . Cefaclor   . Diclofenac Sodium   . Ibuprofen   . Naproxen Sodium    Current Outpatient Prescriptions on File Prior to Visit  Medication Sig Dispense Refill  . Ciclopirox & Lacquer Removal 8 % KIT Apply 1 application topically daily.  1 kit  0  . DULoxetine (CYMBALTA) 60 MG capsule Take 1 capsule (60 mg total) by mouth 2 (two) times daily.  180 capsule  0  . fluocinolone (VANOS) 0.01 % cream Apply topically 2 (two) times daily.        Marland Kitchen HYDROcodone-acetaminophen (NORCO) 10-325 MG per tablet Take 1 tablet by mouth every 6 (six) hours as needed for pain.      . hydrOXYzine (ATARAX/VISTARIL) 25 MG tablet Take 2 by mouth at bedtime  60 tablet  6  . lamoTRIgine (LAMICTAL) 150 MG tablet Take two tablets by mouth daily  180 tablet  3  . levalbuterol (XOPENEX HFA) 45 MCG/ACT inhaler Inhale 1-2 puffs into  the lungs every 4 (four) hours as needed.        . methylphenidate (RITALIN) 10 MG tablet Take 1 tablet (10 mg total) by mouth 2 (two) times daily.  60 tablet  0  . temazepam (RESTORIL) 30 MG capsule Take one to two tablets by mouth at bedtime  60 capsule  0  . tiZANidine (ZANAFLEX) 4 MG tablet TAKE ONE TABLET BY MOUTH EVERY SIX HOURS AS NEEDED  30 tablet  0   No current facility-administered medications on file prior to visit.      The PMH, PSH, Social History, Family History, Medications, and allergies have been reviewed in [facility], and have been updated if relevant.     Review of Systems       See HPI Objective:   Physical Exam BP 104/74  Pulse 104  Temp(Src) 99.4 F (37.4 C)  Wt 210 lb (95.255 kg)  BMI  31 kg/m2  General:  alert, very talkative, seems to have diffiiculty focusing on one topic. Resp:  CTA bilaterally CVS:  RRR Abd:  Soft, NT, no CVA tenderness Psych:  Cognition and judgment appear intact. Alert and cooperative with normal attention span and concentration. No apparent delusions, illusions, hallucinations Neuro:  Difficulty concentrating during our office visit today, grip strength normal bilaterally, having difficulty pushing buttons and phone, other fine motor skills.  Assessment and Plan:  1. Dysuria Will treat with Bactrim DS- 1 tab twice daily x 3 days. Send for cx. - POCT urinalysis dipstick - Urine culture  2. Chronic pain syndrome Rx for Norco refilled.  3. Irregular menstrual cycle Likely perimenopausal. She will continue to monitor.

## 2013-05-20 LAB — URINE CULTURE
Colony Count: NO GROWTH
Organism ID, Bacteria: NO GROWTH

## 2013-05-24 ENCOUNTER — Other Ambulatory Visit: Payer: Self-pay | Admitting: Family Medicine

## 2013-06-19 ENCOUNTER — Other Ambulatory Visit: Payer: Self-pay

## 2013-06-19 MED ORDER — TEMAZEPAM 30 MG PO CAPS: ORAL_CAPSULE | ORAL | Status: DC

## 2013-06-19 NOTE — Telephone Encounter (Signed)
[facility] 

## 2013-06-19 NOTE — Telephone Encounter (Signed)
Pt request refill temazepam to [facility]; pt request additional refills on prescription and pt is out of medication.Please advise.

## 2013-06-22 ENCOUNTER — Other Ambulatory Visit: Payer: Self-pay

## 2013-06-22 NOTE — Telephone Encounter (Signed)
Pt left v/m requesting refill on hydroxyzine and tizanidine to [facility]. Pt request to be filled today; thought [facility] had already requested.

## 2013-06-23 MED ORDER — HYDROXYZINE HCL 25 MG PO TABS: ORAL_TABLET | ORAL | Status: DC

## 2013-06-23 MED ORDER — TIZANIDINE HCL 4 MG PO TABS: ORAL_TABLET | ORAL | Status: DC

## 2013-06-23 NOTE — Telephone Encounter (Signed)
Advised patient ref[facility]

## 2013-06-23 NOTE — Telephone Encounter (Signed)
plz ntoify sent in.

## 2013-06-30 ENCOUNTER — Other Ambulatory Visit: Payer: Self-pay

## 2013-06-30 MED ORDER — METHYLPHENIDATE HCL 10 MG PO TABS: 10.0000 mg | ORAL_TABLET | Freq: Two times a day (BID) | ORAL | Status: DC

## 2013-06-30 NOTE — Telephone Encounter (Signed)
Pt left v/m requesting rx ritalin. Call when ready for pick up. 

## 2013-06-30 NOTE — Telephone Encounter (Signed)
Left vm informing pt that RX ready at front desk.

## 2013-06-30 NOTE — Addendum Note (Signed)
Addended by: Criselda Peaches B on: 06/30/2013 12:58 PM   Modules accepted: Orders

## 2013-07-12 ENCOUNTER — Other Ambulatory Visit: Payer: Self-pay

## 2013-07-12 NOTE — Telephone Encounter (Signed)
Ok to refill one time only. 

## 2013-07-12 NOTE — Telephone Encounter (Signed)
Pt left v/m requesting refill hydrocodone apap to [facility] ; pts mother is at home with hospice and Sidda has to do dead lift to get her mother out of bed.Please advise.

## 2013-07-13 MED ORDER — HYDROCODONE-ACETAMINOPHEN 10-325 MG PO TABS: 1.0000 | ORAL_TABLET | Freq: Four times a day (QID) | ORAL | Status: DC | PRN

## 2013-07-13 NOTE — Telephone Encounter (Signed)
[facility] 

## 2013-07-14 ENCOUNTER — Encounter: Payer: Self-pay | Admitting: Family Medicine

## 2013-07-18 ENCOUNTER — Other Ambulatory Visit: Payer: Self-pay | Admitting: Family Medicine

## 2013-07-18 NOTE — Telephone Encounter (Signed)
Ok to refill 

## 2013-07-25 ENCOUNTER — Ambulatory Visit (INDEPENDENT_AMBULATORY_CARE_PROVIDER_SITE_OTHER): Payer: BC Managed Care – PPO | Admitting: Family Medicine

## 2013-07-25 ENCOUNTER — Ambulatory Visit (INDEPENDENT_AMBULATORY_CARE_PROVIDER_SITE_OTHER)
Admission: RE | Admit: 2013-07-25 | Discharge: 2013-07-25 | Disposition: A | Discharge status: Home / Self Care | Payer: BC Managed Care – PPO | Source: Ambulatory Visit | Attending: Family Medicine | Admitting: Family Medicine

## 2013-07-25 ENCOUNTER — Encounter: Payer: Self-pay | Admitting: Family Medicine

## 2013-07-25 VITALS — BP 110/68 | HR 84 | Temp 99.2°F | Wt 219.0 lb

## 2013-07-25 DIAGNOSIS — R232 Flushing: Secondary | ICD-10-CM | POA: Insufficient documentation

## 2013-07-25 DIAGNOSIS — R300 Dysuria: Secondary | ICD-10-CM

## 2013-07-25 DIAGNOSIS — R319 Hematuria, unspecified: Secondary | ICD-10-CM

## 2013-07-25 DIAGNOSIS — Z87442 Personal history of urinary calculi: Secondary | ICD-10-CM

## 2013-07-25 DIAGNOSIS — G894 Chronic pain syndrome: Secondary | ICD-10-CM

## 2013-07-25 DIAGNOSIS — M1990 Unspecified osteoarthritis, unspecified site: Secondary | ICD-10-CM

## 2013-07-25 DIAGNOSIS — N951 Menopausal and female climacteric states: Secondary | ICD-10-CM

## 2013-07-25 DIAGNOSIS — R609 Edema, unspecified: Secondary | ICD-10-CM

## 2013-07-25 LAB — CBC WITH DIFFERENTIAL/PLATELET
Basophils Absolute: 0 10*3/uL (ref 0.0–0.1)
HCT: 35.7 % — ABNORMAL LOW (ref 36.0–46.0)
Hemoglobin: 12 g/dL (ref 12.0–15.0)
Lymphs Abs: 2.1 10*3/uL (ref 0.7–4.0)
MCV: 96.4 fl (ref 78.0–100.0)
Monocytes Absolute: 0.5 10*3/uL (ref 0.1–1.0)
Monocytes Relative: 6.1 % (ref 3.0–12.0)
Neutro Abs: 5.2 10*3/uL (ref 1.4–7.7)
RDW: 13.5 % (ref 11.5–14.6)

## 2013-07-25 LAB — COMPREHENSIVE METABOLIC PANEL
ALT: 11 U/L (ref 0–35)
CO2: 30 mEq/L (ref 19–32)
Creatinine, Ser: 0.7 mg/dL (ref 0.4–1.2)
GFR: 89.23 mL/min (ref >60.00)
Glucose, Bld: 65 mg/dL — ABNORMAL LOW (ref 70–99)
Total Bilirubin: 0.3 mg/dL (ref 0.3–1.2)

## 2013-07-25 LAB — POCT URINALYSIS DIPSTICK
Bilirubin, UA: NEGATIVE
Glucose, UA: NEGATIVE
Leukocytes, UA: NEGATIVE
Nitrite, UA: NEGATIVE

## 2013-07-25 LAB — T4, FREE: Free T4: 0.82 ng/dL (ref 0.60–1.60)

## 2013-07-25 IMAGING — CR DG ABDOMEN 1V
1 series · 1 of 1 positions shown · non-contrast
Comparison: None.

CLINICAL DATA: History of nephrolithiasis, hematuria, back pain

ABDOMEN - 1 VIEW

[view not recorded]
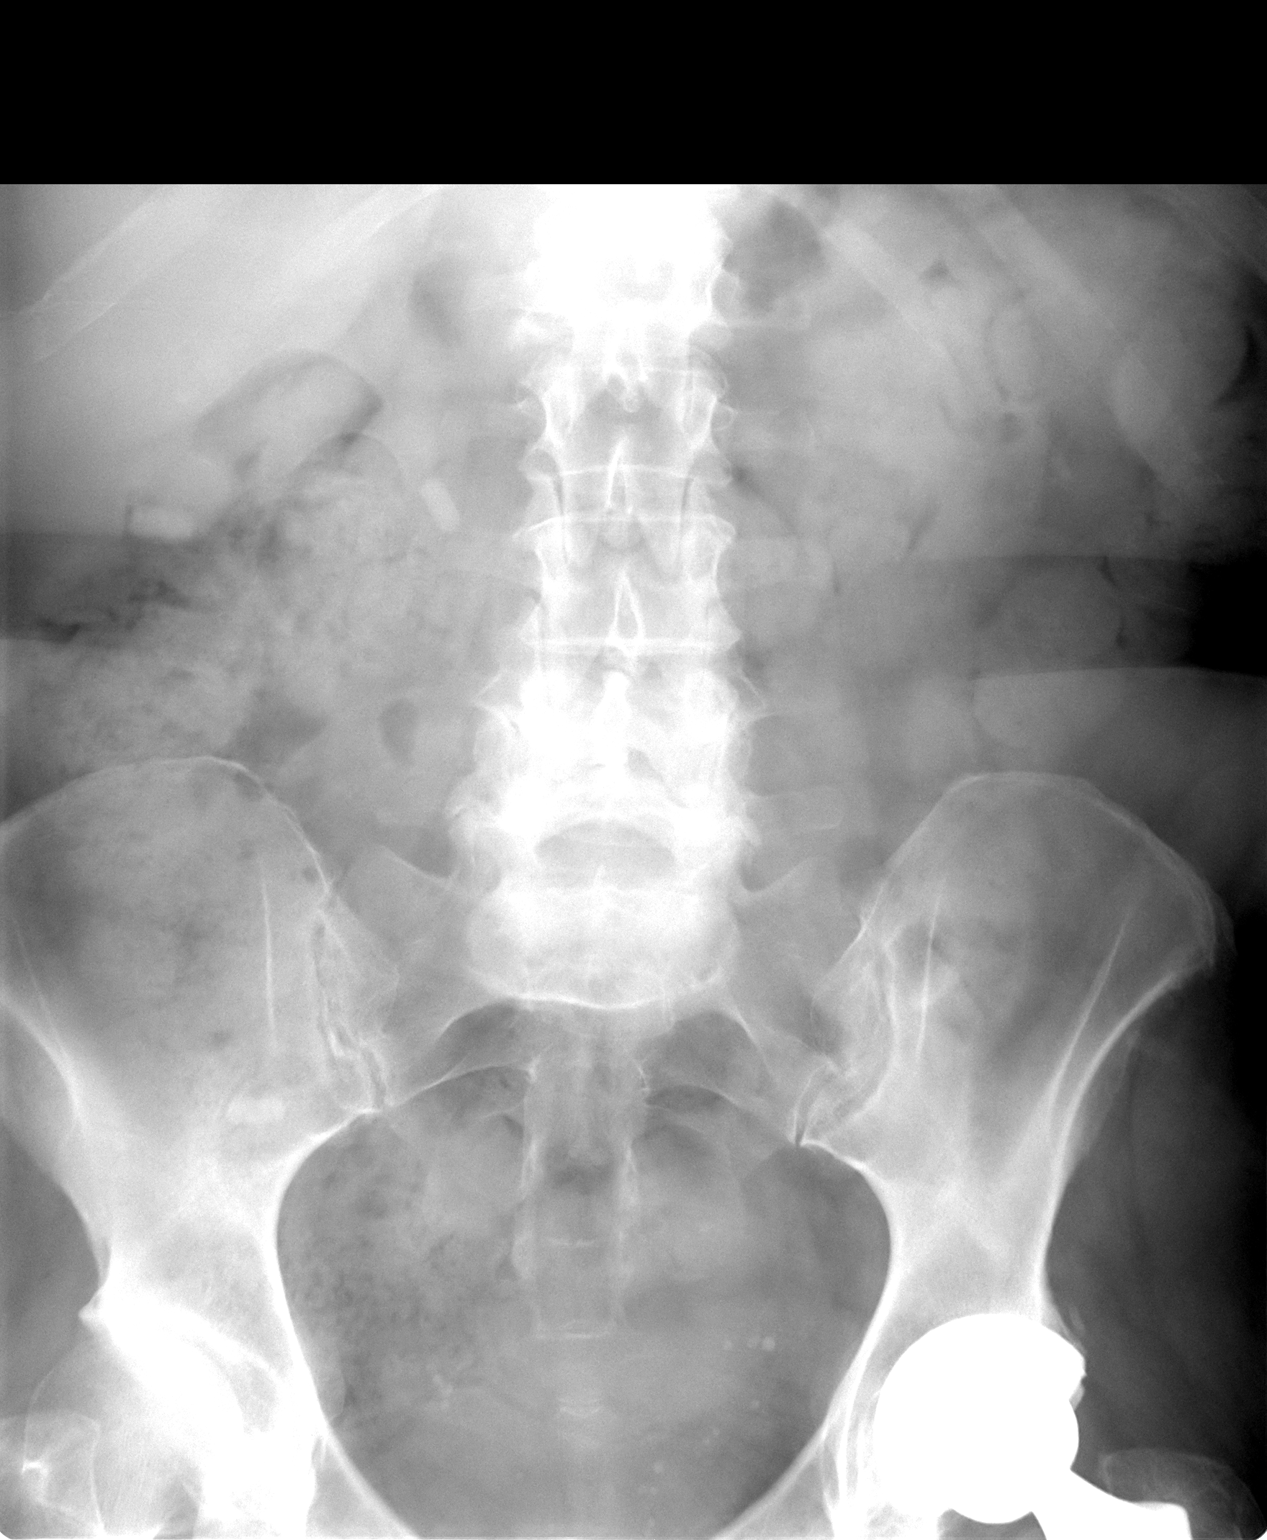

[1 of 1 positions shown; findings below may reference images not displayed]

FINDINGS: There is an approximately 7 mm opacity overlying the expected
location of the right renal fossa.

Two oval shaped opacities overlying the right upper abdomen are
favored to represent a radiopaque pill fragments within the
overlying colon.  A similar appearing opacity overlies the right
lower abdominal quadrant and likely represents a pill fragment
within the cecum.

No definite opacities overlie the expected location of the left
renal fossa.  Multiple phleboliths overlie the lower pelvis.  No
definite opacities overlie the expected location of either ureter
or the urinary bladder.

Moderate colonic stool burden without evidence of obstruction.

Post left total hip replacement, incompletely imaged.  Multilevel
lumbar spine DDD is suspected.
IMPRESSION: Possible 7 mm opacity overlying the right renal pelvis may
represent a right-sided renal stone.  Further evaluation with
abdominal CT may be performed as clinically indicated.

## 2013-07-25 NOTE — Addendum Note (Signed)
Addended by: Eliezer Bottom on: 07/25/2013 12:25 PM   Modules accepted: Orders

## 2013-07-25 NOTE — Patient Instructions (Addendum)
Good to see you. I'm sorry to hear about your mom.  We will call with your lab and xray results.

## 2013-07-25 NOTE — Progress Notes (Signed)
Subjective:    Patient ID: Emily Moon, female    DOB: 11/18/1960, 52 y.o.   MRN: [id number]  HPI 52 yo with complicated medical history, including bipolar disorder, progressive MS here for several issues.   Chronic pain- was improved with her hip replacement this year.   Moved back here to help take care of her mom with progressive dementia (now on hospice).  She is lifting her mom, on her feet all day.  Says she has gained 19 pounds over the past week.  She thinks she is retaining fluid somewhere.  Also having hot flashes.  Still having periods.     Dysuria- ongoing for weeks.  Increased frequency.  Low back has been hurting more.  No hematuria.  Does have a h/o kidney stones.   Patient Active Problem List   Diagnosis Date Noted  . Hot flashes 07/25/2013  . Edema 07/25/2013  . Dysuria 05/18/2013  . Irregular menstrual cycle 05/18/2013  . Insomnia 09/01/2011  . Osteoarthritis of hip 08/05/2011  . Left hip pain 06/03/2011  . Sinusitis 05/14/2011  . Obesity 05/04/2011  . Leg cramps 04/29/2011  . Well woman exam with routine gynecological exam 02/05/2011  . Perimenopausal vasomotor symptoms 02/05/2011  . HYPERLIPIDEMIA 01/20/2011  . SMOKER 01/20/2011  . PALPITATIONS 01/16/2011  . Chronic pain syndrome 01/07/2011  . RENAL CALCULUS, RECURRENT 11/13/2010  . SYNCOPE 10/23/2010  . OTHER AND UNSPECIFIED BIPOLAR DISORDERS 08/28/2010  . DEPRESSION 08/28/2010  . MULTIPLE SCLEROSIS, PROGRESSIVE/RELAPSING 08/28/2010  . GERD 08/28/2010  . OSTEOARTHRITIS 08/28/2010  . NEPHROLITHIASIS, HX OF 08/28/2010   Past Medical History  Diagnosis Date  . Bipolar disorder   . Multiple sclerosis   . Depression   . GERD (gastroesophageal reflux disease)   . Nephrolithiasis   . Osteoporosis     osteoarthritis  . Fibromyalgia syndrome    Past Surgical History  Procedure Laterality Date  . Tonsillectomy    . Joint replacement      hip replacement   History  Substance Use Topics  .  Smoking status: Current Some Day Smoker  . Smokeless tobacco: Not on file     Comment: Quit 06/2012  . Alcohol Use: No   Family History  Problem Relation Age of Onset  . Cancer Father    Allergies  Allergen Reactions  . Ultram [Tramadol Hcl]   . Aripiprazole   . Cefaclor   . Diclofenac Sodium   . Ibuprofen   . Naproxen Sodium    Current Outpatient Prescriptions on File Prior to Visit  Medication Sig Dispense Refill  . DULoxetine (CYMBALTA) 60 MG capsule Take 1 capsule (60 mg total) by mouth 2 (two) times daily.  180 capsule  0  . HYDROcodone-acetaminophen (NORCO) 10-325 MG per tablet Take 1 tablet by mouth every 6 (six) hours as needed for pain.  90 tablet  0  . hydrOXYzine (ATARAX/VISTARIL) 25 MG tablet Take 2 by mouth at bedtime  60 tablet  6  . levalbuterol (XOPENEX HFA) 45 MCG/ACT inhaler Inhale 1-2 puffs into the lungs every 4 (four) hours as needed.        . methylphenidate (RITALIN) 10 MG tablet Take 1 tablet (10 mg total) by mouth 2 (two) times daily.  60 tablet  0  . temazepam (RESTORIL) 30 MG capsule Take one to two tablets by mouth at bedtime  60 capsule  0  . tiZANidine (ZANAFLEX) 4 MG tablet Take 1 tablet by mouth every 6 hours as needed  30 tablet  0  .  lamoTRIgine (LAMICTAL) 150 MG tablet Take two tablets by mouth daily  180 tablet  3  . methylphenidate (RITALIN) 10 MG tablet Take 1 tablet (10 mg total) by mouth 2 (two) times daily.  60 tablet  0   No current facility-administered medications on file prior to visit.      The PMH, PSH, Social History, Family History, Medications, and allergies have been reviewed in [facility], and have been updated if relevant.     Review of Systems       See HPI Objective:   Physical Exam BP 110/68  Pulse 84  Temp(Src) 99.2 F (37.3 C)  Wt 219 lb (99.338 kg)  BMI 32.33 kg/m2  General:  alert, very talkative, seems to have diffiiculty focusing on one topic. Resp:  CTA bilaterally CVS:  RRR Abd:  Soft, NT, no CVA  tenderness Psych:  Cognition and judgment appear intact. Alert and cooperative with normal attention span and concentration. No apparent delusions, illusions, hallucinations Neuro:  Difficulty concentrating during our office visit today, grip strength normal bilaterally, having difficulty pushing buttons and phone, other fine motor skills. Ext:  Trace pedal edema bilaterally  Assessment and Plan:  1. Chronic pain syndrome Deteriorated which is likely worsened by lifting and caring for her mother.  2. OSTEOARTHRITIS See above.  3. NEPHROLITHIASIS, HX OF Does have RBCs on UA.  Will order KUB to avoid cost of CT.  She is aware that all types of stones will not appear in xray.  4. Hot flashes Likely perimenopausal.  5. Edema Trace.  Will check labs.   - TSH - T4, Free - CBC with Differential - Comprehensive metabolic panel  6. Dysuria UA pos for RBC, send urine for cx.

## 2013-07-26 ENCOUNTER — Other Ambulatory Visit: Payer: Self-pay | Admitting: Family Medicine

## 2013-07-26 DIAGNOSIS — N200 Calculus of kidney: Secondary | ICD-10-CM

## 2013-07-26 LAB — URINE CULTURE: Organism ID, Bacteria: NO GROWTH

## 2013-07-27 ENCOUNTER — Ambulatory Visit: Payer: BC Managed Care – PPO | Admitting: Family Medicine

## 2013-08-10 ENCOUNTER — Other Ambulatory Visit: Payer: Self-pay

## 2013-08-10 MED ORDER — DULOXETINE HCL 60 MG PO CPEP: 60.0000 mg | ORAL_CAPSULE | Freq: Two times a day (BID) | ORAL | Status: DC

## 2013-08-10 NOTE — Telephone Encounter (Signed)
Pt left v/m [facility]; pt also request C[facility] called to [facility] while waiting on mail order med; pt has 15 days of med left. On med list Cymbalta last filled 10/26/11.Please advise.

## 2013-08-10 NOTE — Telephone Encounter (Signed)
Ok to refill as pt requests. 

## 2013-08-14 ENCOUNTER — Other Ambulatory Visit: Payer: Self-pay | Admitting: Family Medicine

## 2013-08-14 NOTE — Telephone Encounter (Signed)
Received refill request electronically. Last office visit 07/25/13. Is it okay to refill medication? 

## 2013-08-15 ENCOUNTER — Encounter: Payer: Self-pay | Admitting: Family Medicine

## 2013-08-15 ENCOUNTER — Ambulatory Visit (INDEPENDENT_AMBULATORY_CARE_PROVIDER_SITE_OTHER): Payer: BC Managed Care – PPO | Admitting: Family Medicine

## 2013-08-15 VITALS — BP 122/78 | HR 111 | Temp 99.4°F | Ht 69.0 in | Wt 209.8 lb

## 2013-08-15 DIAGNOSIS — R109 Unspecified abdominal pain: Secondary | ICD-10-CM

## 2013-08-15 DIAGNOSIS — N200 Calculus of kidney: Secondary | ICD-10-CM

## 2013-08-15 LAB — POCT URINALYSIS DIPSTICK
Glucose, UA: NEGATIVE
Ketones, UA: NEGATIVE
Protein, UA: NEGATIVE
Spec Grav, UA: 1.01
Urobilinogen, UA: 0.2
pH, UA: 7

## 2013-08-15 LAB — POCT UA - MICROSCOPIC ONLY
Casts, Ur, LPF, POC: 0
Yeast, UA: 0

## 2013-08-15 MED ORDER — OXYCODONE HCL 15 MG PO TABS: 15.0000 mg | ORAL_TABLET | ORAL | Status: DC | PRN

## 2013-08-15 NOTE — Assessment & Plan Note (Signed)
Strong suspicion that she is passing a stone on the R (reviewed her urology notes)  Urine sent for cx to r/o infection  Given 30 oxycodone (pt states she cannot take acetaminophen or nsaids or any kind of capsule) - to take carefully every 4 hours as needed  Pt was worried this was not going to be enough pain control - but I inst her if pain is worse to let us know (she may very well need to be seen in hospital if that occurs) Given urine screens as well as urine hats (multiple at her request) I asked her to update [place] tomorrow re: symptoms -if not passed will need f/u with Dr Isabel Caprice

## 2013-08-15 NOTE — Progress Notes (Signed)
Subjective:    Patient ID: Emily Moon, female    DOB: 1961/08/22, 52 y.o.   MRN: [id number]  HPI Her with side and abdominal pain   Has hx of kidney stones in the kidneys  Had option of lithotripsy in the past -and held off on it   Pain is in that R flank and moving its way around the abdomen  Her urine stream seemed funny recently and lots of frequency also  This am the pain started about 8 am   [facility] Dr Isabel Caprice  Last CT this mo showed a 1-2 mm kidney stone - that was not causing any hydronephrosis  ? Last time she passed a stone  Has had a stent in the past (and that was complicated by infection)  Low grade temp today  ? Perhaps a little bit of chills at home    Is allergic to tylenol - has to use other pain relievers  alsno several nsaids and ultram   Patient Active Problem List   Diagnosis Date Noted  . Hot flashes 07/25/2013  . Edema 07/25/2013  . Dysuria 05/18/2013  . Irregular menstrual cycle 05/18/2013  . Insomnia 09/01/2011  . Osteoarthritis of hip 08/05/2011  . Left hip pain 06/03/2011  . Sinusitis 05/14/2011  . Obesity 05/04/2011  . Leg cramps 04/29/2011  . Well woman exam with routine gynecological exam 02/05/2011  . Perimenopausal vasomotor symptoms 02/05/2011  . HYPERLIPIDEMIA 01/20/2011  . SMOKER 01/20/2011  . PALPITATIONS 01/16/2011  . Chronic pain syndrome 01/07/2011  . RENAL CALCULUS, RECURRENT 11/13/2010  . SYNCOPE 10/23/2010  . OTHER AND UNSPECIFIED BIPOLAR DISORDERS 08/28/2010  . DEPRESSION 08/28/2010  . MULTIPLE SCLEROSIS, PROGRESSIVE/RELAPSING 08/28/2010  . GERD 08/28/2010  . OSTEOARTHRITIS 08/28/2010  . NEPHROLITHIASIS, HX OF 08/28/2010   Past Medical History  Diagnosis Date  . Bipolar disorder   . Multiple sclerosis   . Depression   . GERD (gastroesophageal reflux disease)   . Nephrolithiasis   . Osteoporosis     osteoarthritis  . Fibromyalgia syndrome    Past Surgical History  Procedure Laterality Date  .  Tonsillectomy    . Joint replacement      hip replacement   History  Substance Use Topics  . Smoking status: Current Some Day Smoker  . Smokeless tobacco: Not on file  . Alcohol Use: No   Family History  Problem Relation Age of Onset  . Cancer Father    Allergies  Allergen Reactions  . Tylenol [Acetaminophen] Shortness Of Breath and Swelling  . Ultram [Tramadol Hcl]   . Aripiprazole   . Cefaclor   . Diclofenac Sodium   . Ibuprofen   . Naproxen Sodium    Current Outpatient Prescriptions on File Prior to Visit  Medication Sig Dispense Refill  . DULoxetine (CYMBALTA) 60 MG capsule Take 1 capsule (60 mg total) by mouth 2 (two) times daily.  30 capsule  0  . HYDROcodone-acetaminophen (NORCO) 10-325 MG per tablet Take 1 tablet by mouth every 6 (six) hours as needed for pain.  90 tablet  0  . hydrOXYzine (ATARAX/VISTARIL) 25 MG tablet Take 2 by mouth at bedtime  60 tablet  6  . lamoTRIgine (LAMICTAL) 150 MG tablet Take two tablets by mouth daily  180 tablet  3  . levalbuterol (XOPENEX HFA) 45 MCG/ACT inhaler Inhale 1-2 puffs into the lungs every 4 (four) hours as needed.        . methylphenidate (RITALIN) 10 MG tablet Take 1 tablet (10 mg total)  by mouth 2 (two) times daily.  60 tablet  0  . methylphenidate (RITALIN) 10 MG tablet Take 1 tablet (10 mg total) by mouth 2 (two) times daily.  60 tablet  0  . temazepam (RESTORIL) 30 MG capsule Take one to two tablets by mouth at bedtime  60 capsule  0  . tiZANidine (ZANAFLEX) 4 MG tablet Take 1 tablet by mouth every 6 hours as needed  30 tablet  0   No current facility-administered medications on file prior to visit.      Review of Systems Review of Systems  Constitutional: Negative for fever, appetite change, and unexpected weight change.  Eyes: Negative for pain and visual disturbance.  Respiratory: Negative for cough and shortness of breath.   Cardiovascular: Negative for cp or palpitations    Gastrointestinal: Negative for  nausea, diarrhea and constipation.  Genitourinary: pos for urgency and frequency. (pt is on menses) , pos for dysuria and flank pain  Skin: Negative for pallor or rash   Neurological: Negative for weakness, light-headedness, numbness and headaches.  Hematological: Negative for adenopathy. Does not bruise/bleed easily.  Psychiatric/Behavioral: Negative for dysphoric mood. The patient is not nervous/anxious.         Objective:   Physical Exam  Constitutional: She appears well-developed and well-nourished. No distress.  HENT:  Head: Normocephalic and atraumatic.  Eyes: Conjunctivae and EOM are normal. Pupils are equal, round, and reactive to light. Right eye exhibits no discharge. Left eye exhibits no discharge. No scleral icterus.  Neck: Normal range of motion. Neck supple.  Cardiovascular: Normal rate and regular rhythm.   Pulmonary/Chest: Effort normal and breath sounds normal.  Abdominal: Soft. Bowel sounds are normal. She exhibits no distension and no mass. There is tenderness. There is no rebound and no guarding.  Tender over R CVA and flank area without rebound or guarding   Musculoskeletal: She exhibits no edema.  Lymphadenopathy:    She has no cervical adenopathy.  Neurological: She is alert.  Skin: Skin is warm and dry. No rash noted. No erythema. No pallor.  Psychiatric: Her speech is rapid and/or pressured. She is agitated.  Pt had some pressured speech and was fairly difficult to communicate with           Assessment & Plan:

## 2013-08-15 NOTE — Assessment & Plan Note (Signed)
Most likely passing kidney stone ua with blood - pend cx as well  See assessment for recurrent stones - for plan

## 2013-08-15 NOTE — Patient Instructions (Addendum)
I think you may be passing a kidney stone  Use the urine screen and a hat if needed  I am going to culture your urine  Drink lots of water  Use great caution with the oxycodone - no more than one pill every 4 hours (and you may need a stool softener to prevent constipation with this) I am going to sent this note to your urologist  Please update [place] if no improvement tomorrow

## 2013-08-16 ENCOUNTER — Telehealth: Payer: Self-pay | Admitting: *Deleted

## 2013-08-16 ENCOUNTER — Other Ambulatory Visit: Payer: Self-pay | Admitting: Family Medicine

## 2013-08-16 ENCOUNTER — Other Ambulatory Visit: Payer: Self-pay

## 2013-08-16 DIAGNOSIS — N200 Calculus of kidney: Secondary | ICD-10-CM

## 2013-08-16 DIAGNOSIS — R39198 Other difficulties with micturition: Secondary | ICD-10-CM | POA: Insufficient documentation

## 2013-08-16 DIAGNOSIS — R109 Unspecified abdominal pain: Secondary | ICD-10-CM

## 2013-08-16 NOTE — Telephone Encounter (Signed)
Agree with Dr. Milinda Antis.  If she is having urinary retention she may need urgent cath.

## 2013-08-16 NOTE — Telephone Encounter (Signed)
Spoke with Shirlee Limerick and she called Physicist, medical and they are doing an urgent referral and they are going to call the pt directly to set up an urgent appt

## 2013-08-16 NOTE — Telephone Encounter (Signed)
Opened in error

## 2013-08-16 NOTE — Telephone Encounter (Signed)
Pt left voicemail letting me know that she has been drinking a lot of water and now she can't urinate at all and wants to know what she should do  Sent message to Dr. Milinda Antis and PCP Dr. Dayton Martes, please advise

## 2013-08-16 NOTE — Telephone Encounter (Signed)
Called Urology office to request Urgent appointment for today, faxed office note over to triage nurse at Dr Noralee Chars office, triage nurse called the patient directly to schedule her an urgent appt for today. Patient told the nurse she couldn't come to their office today and that she was passing the stone right now and feeling better, patient is supposed to call her Urologist office back before this Friday to let them know if she needs to be seen.

## 2013-08-16 NOTE — Telephone Encounter (Signed)
Temazepam refill printed instead of going electronically; cked with Carlena Sax RN team lead OK to call in to [facility]. Spoke with Brayton Caves. Pt notified refills done.

## 2013-08-16 NOTE — Telephone Encounter (Signed)
Pt left v/m requesting status of refills requested for temazepam and tizanidine to [facility]. Pt request cb when med refilled.

## 2013-08-16 NOTE — Telephone Encounter (Signed)
She needs an urgent urology referral then - I will place that now  And if they cannot see her today - let me know -she may need to go to ER for a urine cath  I will route this to [facility] as well

## 2013-08-17 ENCOUNTER — Ambulatory Visit (INDEPENDENT_AMBULATORY_CARE_PROVIDER_SITE_OTHER): Payer: BC Managed Care – PPO

## 2013-08-17 DIAGNOSIS — Z23 Encounter for immunization: Secondary | ICD-10-CM

## 2013-08-17 LAB — URINE CULTURE: Colony Count: 40000

## 2013-09-04 ENCOUNTER — Other Ambulatory Visit: Payer: Self-pay

## 2013-09-04 NOTE — Telephone Encounter (Signed)
Pt requesting rx oxycodone. Call when ready for pick up. Pt will pick up on 09/07/13.

## 2013-09-05 NOTE — Telephone Encounter (Signed)
Controlled substance 

## 2013-09-07 ENCOUNTER — Ambulatory Visit (INDEPENDENT_AMBULATORY_CARE_PROVIDER_SITE_OTHER): Payer: BC Managed Care – PPO | Admitting: Family Medicine

## 2013-09-07 ENCOUNTER — Encounter: Payer: Self-pay | Admitting: Family Medicine

## 2013-09-07 VITALS — BP 116/74 | HR 95 | Temp 99.7°F | Wt 213.5 lb

## 2013-09-07 DIAGNOSIS — M25559 Pain in unspecified hip: Secondary | ICD-10-CM

## 2013-09-07 DIAGNOSIS — M25552 Pain in left hip: Secondary | ICD-10-CM

## 2013-09-07 DIAGNOSIS — G894 Chronic pain syndrome: Secondary | ICD-10-CM

## 2013-09-07 MED ORDER — OXYCODONE HCL 15 MG PO TABS: 15.0000 mg | ORAL_TABLET | Freq: Four times a day (QID) | ORAL | Status: DC | PRN

## 2013-09-07 MED ORDER — DULOXETINE HCL 60 MG PO CPEP: 60.0000 mg | ORAL_CAPSULE | Freq: Every day | ORAL | Status: DC

## 2013-09-07 NOTE — Assessment & Plan Note (Addendum)
Known h/o bilateral hip osteoarthritis s/p L replacement. Actually today on exam pain more consistent with greater trochanteric bursitis - pt states has had this in the past and would be willing for bursal steroid injection. I suggested she schedule appointment with Dr. Patsy Lager our sports medicine doctor to discuss possible steroid injection.  Pt agrees with this. Return as needed.

## 2013-09-07 NOTE — Telephone Encounter (Signed)
Will see today.  

## 2013-09-07 NOTE — Progress Notes (Signed)
  Subjective:    Patient ID: Emily Moon, female    DOB: 1960/12/10, 52 y.o.   MRN: [id number]  HPI CC: discuss pain meds  Emily Moon presents today.  I am familiar with this patient as her mother is a patient of mine.  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  Spends 7:30am to 11:30pm with mom.  Tries to avoid but occasionally does heavy lifting. H/o progressive MS.  H/o bipolar disorder.  H/o severe osteoarthritis per patient - needs hips replaced.  Having worsening hip pain R>L.  Saw orthopedist at [facility] in [place]. Recent kidney stone - passed.  Has 2 more but not causing issues currently.  Was prescribed #30 oxycodone 08/15/2013 for nephrolithiasis.  Requests refill of pain med to help control hip pain.   NSAID allergy Tylenol allergy Tramadol allergy voltaren gel not effective in the past.  On rapaflo and another unknown med (uroquel?) per urology.  Wt Readings from Last 3 Encounters:  09/07/13 213 lb 8 oz (96.843 kg)  08/15/13 209 lb 12 oz (95.142 kg)  07/25/13 219 lb (99.338 kg)    Past Medical History  Diagnosis Date  . Bipolar disorder   . Multiple sclerosis   . Depression   . GERD (gastroesophageal reflux disease)   . Nephrolithiasis   . Osteoporosis     osteoarthritis  . Fibromyalgia syndrome     Past Surgical History  Procedure Laterality Date  . Tonsillectomy    . Joint replacement      hip replacement   Review of Systems Per HPI    Objective:   Physical Exam  Nursing note and vitals reviewed. Constitutional: She appears well-developed and well-nourished. No distress.  Musculoskeletal: She exhibits no edema.  Mild midline lumbar spine tenderness Tender with int rotation bilateral hips R>L Reproducible pain at GTB bilaterally L>R       Assessment & Plan:

## 2013-09-07 NOTE — Assessment & Plan Note (Signed)
Worsening pain in setting of caring for ill mother - I have refilled oxycodone 15mg  #30.

## 2013-09-07 NOTE — Telephone Encounter (Signed)
Pt said she had called on 09/05/13 and spoke with someone at front desk and was assured if needed appt could be worked in. Offered pt appt today at 5:15 pm; pt said only has coverage for her mother(who pt takes care of) from 1-3 on Thursdays. Pt also does not have her own transportation and so would need appt 1-3 pm today. No available appt today from 1-3. Spoke with Hansel Starling to see about possible work in with one of the other physicians. Pt said she will take the 5:15 today with Dr Sharen Hones and get her sister to sit with pts mother and borrow sisters car. Pt has passed one kidney stone but still has 2 kidney stones not passed yet; pt said seeing urologist for kidney stones. Pt said having rt hip and back pain due to lifting her mother; pt is using cane also.

## 2013-09-07 NOTE — Patient Instructions (Signed)
I will refill oxycodone for you.  I do want you to schedule appointment with Dr. Patsy Lager according to your schedule to discuss trochanteric bursitis injection. Do stretching exercises.

## 2013-09-13 ENCOUNTER — Other Ambulatory Visit: Payer: Self-pay | Admitting: Family Medicine

## 2013-09-13 ENCOUNTER — Telehealth: Payer: Self-pay

## 2013-09-13 MED ORDER — TEMAZEPAM 30 MG PO CAPS: ORAL_CAPSULE | ORAL | Status: DC

## 2013-09-13 MED ORDER — TIZANIDINE HCL 4 MG PO TABS: ORAL_TABLET | ORAL | Status: DC

## 2013-09-13 NOTE — Telephone Encounter (Signed)
Phone in please.

## 2013-09-13 NOTE — Telephone Encounter (Signed)
I apologize about time it took to get to your desk; form given to Nicki Reaper NP.

## 2013-09-13 NOTE — Telephone Encounter (Signed)
Pt left v/m requesting refill for temazepam and tizanidine done today because pt will need to pick up 09/14/13 at [employer] [facility] [employer]......Marland Kitchen

## 2013-09-13 NOTE — Telephone Encounter (Signed)
Pt request for lillycares cymbalta; form on [employer].

## 2013-09-13 NOTE — Telephone Encounter (Addendum)
Medication phoned to [facility] pharmacy as instructed.pt notified med called to [facility].

## 2013-09-13 NOTE — Addendum Note (Signed)
Addended by: Lorre Munroe on: 09/13/2013 12:39 PM   Modules accepted: Orders

## 2013-09-13 NOTE — Telephone Encounter (Signed)
Ok to phone in.

## 2013-09-13 NOTE — Telephone Encounter (Signed)
Received refill request electronically. Last office visit 09/07/13/acute visit. Is it okay to refill medication?

## 2013-09-13 NOTE — Telephone Encounter (Signed)
I cant seem to find it on my desk.Emily Moon

## 2013-09-14 ENCOUNTER — Ambulatory Visit (INDEPENDENT_AMBULATORY_CARE_PROVIDER_SITE_OTHER): Payer: BC Managed Care – PPO | Admitting: Family Medicine

## 2013-09-14 ENCOUNTER — Encounter: Payer: Self-pay | Admitting: Family Medicine

## 2013-09-14 VITALS — BP 110/74 | HR 90 | Temp 99.0°F | Ht 69.0 in | Wt 216.5 lb

## 2013-09-14 DIAGNOSIS — M7061 Trochanteric bursitis, right hip: Secondary | ICD-10-CM

## 2013-09-14 DIAGNOSIS — M76899 Other specified enthesopathies of unspecified lower limb, excluding foot: Secondary | ICD-10-CM

## 2013-09-14 DIAGNOSIS — M6790 Unspecified disorder of synovium and tendon, unspecified site: Secondary | ICD-10-CM

## 2013-09-14 DIAGNOSIS — M167 Other unilateral secondary osteoarthritis of hip: Secondary | ICD-10-CM

## 2013-09-14 DIAGNOSIS — M1631 Unilateral osteoarthritis resulting from hip dysplasia, right hip: Secondary | ICD-10-CM

## 2013-09-14 DIAGNOSIS — M67952 Unspecified disorder of synovium and tendon, left thigh: Secondary | ICD-10-CM

## 2013-09-14 DIAGNOSIS — Q6589 Other specified congenital deformities of hip: Secondary | ICD-10-CM

## 2013-09-14 MED ORDER — OXYCODONE HCL 7.5 MG PO TABS: 1.0000 | ORAL_TABLET | Freq: Four times a day (QID) | ORAL | Status: DC | PRN

## 2013-09-14 NOTE — Telephone Encounter (Signed)
Form is finished. It is on my desk if you would like to fax it back

## 2013-09-14 NOTE — Progress Notes (Signed)
Date:  09/14/2013   Name:  Emily Moon   DOB:  15-Oct-1961   MRN:  [id number] Gender: female Age: 52 y.o.  Primary Physician:  Ruthe Mannan, MD   Chief Complaint: Hip Pain   History of Present Illness:  Emily Moon is a 52 y.o. very pleasant female patient who presents with the following:  History of MS, chronic pain, congenital hip dysplasia, S/p L THA: had a congenital hip dysplasia.  06/29/2012 date of THA on L by Dr. Thersa Salt at the [facility]. Feels much better.  Groin pain and lateral hip pain on the right. Bad hip pain on the right. Some grinding sensation, worries about dislocation, and lateral pain also. Has to take care of her mother who is on hospice nothing working now, requiring high doses of narcotics.   Left lateral hip and slightly posterior is hurting as well.   Past Medical History, Surgical History, Social History, Family History, Problem List, Medications, and Allergies have been reviewed and updated if relevant.  Current Outpatient Prescriptions on File Prior to Visit  Medication Sig Dispense Refill  . DULoxetine (CYMBALTA) 60 MG capsule Take 1 capsule (60 mg total) by mouth daily.  30 capsule  3  . hydrOXYzine (ATARAX/VISTARIL) 25 MG tablet Take 2 by mouth at bedtime  60 tablet  6  . lamoTRIgine (LAMICTAL) 150 MG tablet Take two tablets by mouth daily  180 tablet  3  . levalbuterol (XOPENEX HFA) 45 MCG/ACT inhaler Inhale 1-2 puffs into the lungs every 4 (four) hours as needed.        . methylphenidate (RITALIN) 10 MG tablet Take 10 mg by mouth as needed.      Marland Kitchen oxyCODONE (ROXICODONE) 15 MG immediate release tablet Take 1 tablet (15 mg total) by mouth every 6 (six) hours as needed for pain.  30 tablet  0  . silodosin (RAPAFLO) 4 MG CAPS capsule Take 4 mg by mouth daily with breakfast.      . temazepam (RESTORIL) 30 MG capsule TAKE ONE TO TWO CAPSULES BY MOUTH NIGHTLY AT BEDTIME  16 capsule  0  . tiZANidine (ZANAFLEX) 4 MG tablet Take 1 tablet by mouth  every 6 hours as needed  30 tablet  0   No current facility-administered medications on file prior to visit.    Review of Systems:  GEN: No fevers, chills. Nontoxic. Primarily MSK c/o today. MSK: Detailed in the HPI GI: tolerating PO intake without difficulty Neuro: No numbness, parasthesias, or tingling associated. Otherwise the pertinent positives of the ROS are noted above.    Physical Examination: BP 110/74  Pulse 90  Temp(Src) 99 F (37.2 C) (Oral)  Ht 5\' 9"  (1.753 m)  Wt 216 lb 8 oz (98.204 kg)  BMI 31.96 kg/m2  LMP 09/05/2013   GEN: WDWN, NAD, Non-toxic, Alert & Oriented x 3 HEENT: Atraumatic, Normocephalic.  Ears and Nose: No external deformity. EXTR: No clubbing/cyanosis/edema NEURO: antalgic gait.  PSYCH: Normally interactive. Conversant. Not depressed or anxious appearing.  Calm demeanor.   HIP EXAM: SIDE: B ROM: Abduction, Flexion, Internal and External range of motion: limited to 40 deg of abd on the R, IROM and EROM with total of 50 deg. L THA move well without pain or limitation.  Pain with terminal IROM and EROM: with the RIGHT with both GTB: R markedly tender, L NT, but proximal to this near glute medius insertion TTP SLR: NEG Knees: No effusion Piriformis: NT at direct palpation Str: flexion: 5/5 abduction: 4+/5 adduction:  5/5  Dg Abd 1 View  07/25/2013   *RADIOLOGY REPORT*  Clinical Data: History of nephrolithiasis, hematuria, back pain  ABDOMEN - 1 VIEW  Comparison: None.  Findings:  There is an approximately 7 mm opacity overlying the expected location of the right renal fossa.  Two oval shaped opacities overlying the right upper abdomen are favored to represent a radiopaque pill fragments within the overlying colon.  A similar appearing opacity overlies the right lower abdominal quadrant and likely represents a pill fragment within the cecum.  No definite opacities overlie the expected location of the left renal fossa.  Multiple phleboliths overlie the  lower pelvis.  No definite opacities overlie the expected location of either ureter or the urinary bladder.  Moderate colonic stool burden without evidence of obstruction.  Post left total hip replacement, incompletely imaged.  Multilevel lumbar spine DDD is suspected.  IMPRESSION: Possible 7 mm opacity overlying the right renal pelvis may represent a right-sided renal stone.  Further evaluation with abdominal CT may be performed as clinically indicated.   Original Report Authenticated By: Tacey Ruiz, MD  Independently reviewed to evaluate hip. THA not completely viewed, but appears to have no adjacent radiolucency. R hip with at least moderate OA, full hip series needed to fully evaluate.  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  PCP on maternity leave. I am going to decrease her oxycodone dose to 7.5 mg, which is more equivalent to the norco 10-325 that she had been getting. Given #90, which is what her PCP has been giving her.   R OA, manage as best she can. Injections may give temporary relief. She has had multiple in the past. States bipolar is stable, and she denies any prior hospitalization. Reviewed that steroids can increase risk for mania.   Trochanteric Bursitis Injection, RIGHT Verbal consent obtained. Risks (including infection, potential atrophy), benefits, and alternatives reviewed. Greater trochanter sterilely prepped with Chloraprep. Ethyl Chloride used for anesthesia. 8 cc of Lidocaine 1% injected with 1.5 cc of 40 mg Depo-Medrol into trochanteric bursa at area of maximal tenderness at greater trochanter. Needle taken to bone to troch bursa, flows easily. Bursa massaged. No bleeding and no complications. Decreased pain after injection. Needle: 22 gauge   Gluteus  Medius, L insertional injection Verbal consent obtained. Risks (including infection, potential atrophy), benefits, and alternatives reviewed. Lateral hip sterilely prepped with Chloraprep. Ethyl Chloride used for anesthesia. 8 cc of Lidocaine 1% injected with 1.5 cc of 40 mg Depo-Medrol into area of maximal tenderness proximal to greater trochanter, at area of gluteus medius insertion. No bleeding and no complications. Decreased pain after injection. Needle: 22 gauge  Updated Complete Medication List:   Medication List       This list is accurate as of: 09/14/13 11:59 PM.  Always use your most recent med list.               BIOTIN PO  Take 600 mg by mouth daily.     calcium carbonate 500 MG chewable tablet  Commonly known as:  TUMS - dosed in mg elemental calcium  Chew 3 tablets by mouth daily.     cetirizine 10 MG tablet  Commonly known as:  ZYRTEC  Take 10 mg by mouth daily.     DULoxetine 60 MG  capsule  Commonly known as:  CYMBALTA  Take 1 capsule (60 mg total) by mouth daily.     hydrOXYzine 25 MG tablet  Commonly known as:  ATARAX/VISTARIL  Take 2 by mouth at bedtime     lamoTRIgine 150 MG tablet  Commonly known as:  LAMICTAL  Take two tablets by mouth daily     levalbuterol 45 MCG/ACT inhaler  Commonly known as:  XOPENEX HFA  Inhale 1-2 puffs into the lungs every 4 (four) hours as needed.     Lysine 1000 MG Tabs  Take 1,000 mg by mouth daily.     methylphenidate 10 MG tablet  Commonly known as:  RITALIN  Take 10 mg by mouth as needed.     MYRBETRIQ 25 MG Tb24 tablet  Generic drug:  mirabegron ER  Take 25 mg by mouth daily.     OxyCODONE HCl 7.5 MG Taba  Take 1 tablet by mouth every 6 (six) hours as needed (pain).     silodosin 4 MG Caps capsule  Commonly known as:  RAPAFLO  Take 4 mg by mouth daily with breakfast.     temazepam 30 MG capsule  Commonly known as:  RESTORIL  TAKE ONE TO TWO CAPSULES BY MOUTH NIGHTLY AT BEDTIME     tiZANidine 4 MG  tablet  Commonly known as:  ZANAFLEX  Take 1 tablet by mouth every 6 hours as needed     VITAMIN D-3 PO  Take 6,000 mg by mouth daily.          Signed,  Elpidio Galea. Jackelyn Illingworth, MD, CAQ Sports Medicine  [facility] [address] [place] [address] Phone: [phone] Fax: [phone]

## 2013-09-15 ENCOUNTER — Telehealth: Payer: Self-pay

## 2013-09-15 ENCOUNTER — Encounter: Payer: Self-pay | Admitting: Family Medicine

## 2013-09-15 DIAGNOSIS — M1631 Unilateral osteoarthritis resulting from hip dysplasia, right hip: Secondary | ICD-10-CM | POA: Insufficient documentation

## 2013-09-15 DIAGNOSIS — Q6589 Other specified congenital deformities of hip: Secondary | ICD-10-CM | POA: Insufficient documentation

## 2013-09-15 HISTORY — DX: Other specified congenital deformities of hip: Q65.89

## 2013-09-15 NOTE — Telephone Encounter (Signed)
Patient called back to say that she will come in on Monday and will bring in the Oxycodone 7.5 mg. Rx to exchange for the new Rx.  She was able to find a sitter for her mother to enable her to do this.

## 2013-09-15 NOTE — Telephone Encounter (Signed)
Does pharmacy have the 5 mg oxycodone? Can she go back to norco?

## 2013-09-15 NOTE — Telephone Encounter (Signed)
Pt left v/m that pt is supposed to go to [facility] for her medications; Dr Patsy Lager gave rx oxycodone without tylenol.  Pt said [facility] does not have in stock and cannot get med in until next week and pt will be out of town. Pt request new med to be written and contact pt and she will have rx picked up. Spoke with Clifton Custard at [facility]; manager is not there today so oxycodone cannot be ordered until first of the week and will not be available until [place] or Fri of next week. Clifton Custard said has oxycodone 10 mg in stock if that would be acceptable.Please advise.

## 2013-09-15 NOTE — Telephone Encounter (Signed)
Spoke with Emily Moon.  She is allergic to tylenol that is why she can't take the Norco.  Would prefer Oxycodone 10 mg so she wouldn't have to take as many pills as she would with the 5 mg.  Emily Moon states she is fine to wait on Dr. Patsy Lager until  Monday to write new prescription.  Will come to office around 1:00 pm to pick up new Rx.  She has the  Oxycodone 7.5mg  prescription to turn back in to [place].

## 2013-09-18 MED ORDER — OXYCODONE HCL 10 MG PO TABS: 10.0000 mg | ORAL_TABLET | Freq: Four times a day (QID) | ORAL | Status: DC | PRN

## 2013-09-18 NOTE — Telephone Encounter (Signed)
Can you print oxycodone 10 mg, 1 po q 6 hours, #90,0 ref  Have her bring 7.5 script back to office.

## 2013-09-18 NOTE — Telephone Encounter (Signed)
Form was faxed to [phone] on 09/15/13, sent for scanning.

## 2013-09-18 NOTE — Addendum Note (Signed)
Addended by: Damita Lack on: 09/18/2013 09:08 AM   Modules accepted: Orders

## 2013-09-18 NOTE — Telephone Encounter (Signed)
Emily Moon notified prescription is ready to be picked up at front desk.

## 2013-09-28 ENCOUNTER — Ambulatory Visit: Payer: BC Managed Care – PPO | Admitting: Family Medicine

## 2013-10-03 ENCOUNTER — Telehealth: Payer: Self-pay

## 2013-10-03 NOTE — Telephone Encounter (Signed)
Pt had cortisone injection 3 weeks ago and pt still having hip pain. Pt has already scheduled appt for 10/04/13 at 12:15.

## 2013-10-04 ENCOUNTER — Encounter: Payer: Self-pay | Admitting: Family Medicine

## 2013-10-04 ENCOUNTER — Ambulatory Visit (INDEPENDENT_AMBULATORY_CARE_PROVIDER_SITE_OTHER): Payer: BC Managed Care – PPO | Admitting: Family Medicine

## 2013-10-04 VITALS — BP 110/78 | HR 98 | Temp 98.8°F | Ht 69.0 in | Wt 216.8 lb

## 2013-10-04 DIAGNOSIS — M167 Other unilateral secondary osteoarthritis of hip: Secondary | ICD-10-CM

## 2013-10-04 DIAGNOSIS — M7061 Trochanteric bursitis, right hip: Secondary | ICD-10-CM

## 2013-10-04 DIAGNOSIS — M25559 Pain in unspecified hip: Secondary | ICD-10-CM

## 2013-10-04 DIAGNOSIS — G894 Chronic pain syndrome: Secondary | ICD-10-CM

## 2013-10-04 DIAGNOSIS — M1631 Unilateral osteoarthritis resulting from hip dysplasia, right hip: Secondary | ICD-10-CM

## 2013-10-04 DIAGNOSIS — M25551 Pain in right hip: Secondary | ICD-10-CM

## 2013-10-04 DIAGNOSIS — M76899 Other specified enthesopathies of unspecified lower limb, excluding foot: Secondary | ICD-10-CM

## 2013-10-04 MED ORDER — TIZANIDINE HCL 4 MG PO TABS: ORAL_TABLET | ORAL | Status: DC

## 2013-10-04 MED ORDER — OXYCODONE HCL 15 MG PO TABS: 15.0000 mg | ORAL_TABLET | Freq: Four times a day (QID) | ORAL | Status: DC | PRN

## 2013-10-04 MED ORDER — DICLOFENAC SODIUM 1 % TD GEL: 4.0000 g | Freq: Four times a day (QID) | TRANSDERMAL | Status: DC

## 2013-10-04 NOTE — Progress Notes (Signed)
Date:  10/04/2013   Name:  Emily Moon   DOB:  Nov 07, 1961   MRN:  [id number] Gender: female Age: 52 y.o.  Primary Physician:  Ruthe Mannan, MD   Chief Complaint: Hip Pain   Subjective:   History of Present Illness:  Emily Moon is a 52 y.o. 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 She also has some significant trochanteric bursitis and gluteus medius tendinopathy. She also has chronic pain. Her primary care provider is on maternity leave. She continues to have some persistent hip pain on the right in the intra-articular region as well as laterally.  At home, her sister fell and broke her hip as well as dislocating her shoulder. Her mother is also on hospice.  Patient Active Problem List   Diagnosis Date Noted  . 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 Difficulty in urination 08/16/2013  . Right flank pain 08/15/2013  . Hot flashes 07/25/2013  . Edema 07/25/2013  . Dysuria 05/18/2013  . Irregular menstrual cycle 05/18/2013  . Insomnia 09/01/2011  . Osteoarthritis of hip 08/05/2011  . Left hip pain 06/03/2011  . Sinusitis 05/14/2011  . Obesity 05/04/2011  . Leg cramps 04/29/2011  . Well woman exam with routine gynecological exam 02/05/2011  . Perimenopausal vasomotor symptoms 02/05/2011  . HYPERLIPIDEMIA 01/20/2011  . SMOKER 01/20/2011  . PALPITATIONS 01/16/2011  . Chronic pain syndrome 01/07/2011  . RENAL CALCULUS, RECURRENT 11/13/2010  . SYNCOPE 10/23/2010  . OTHER AND UNSPECIFIED BIPOLAR DISORDERS 08/28/2010  . DEPRESSION 08/28/2010  . MULTIPLE SCLEROSIS, PROGRESSIVE/RELAPSING 08/28/2010  . GERD 08/28/2010  . OSTEOARTHRITIS 08/28/2010  . NEPHROLITHIASIS, HX OF 08/28/2010    Past Medical History    Diagnosis Date  . Bipolar disorder   . Multiple sclerosis   . Depression   . GERD (gastroesophageal reflux disease)   . Nephrolithiasis   . Osteoporosis     osteoarthritis  . Fibromyalgia syndrome   . Hip dysplasia, 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    Past Surgical History  Procedure Laterality Date  . Tonsillectomy    . Joint replacement Left 2013    hip replacement    History   Social History  . Marital Status: Divorced    Spouse Name: N/A    Number of Children: 1  . Years of Education: N/A   Occupational History  . 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 Smoking status: Current Some Day Smoker -- 0.50 packs/day for 29 years    Types: Cigarettes  . Smokeless tobacco: Never Used  . Alcohol Use: No  . Drug Use: No  . Sexual Activity: Not on file   Other Topics Concern  . Not on file   Social History Narrative  . No narrative on file    Family History  Problem Relation Age of Onset  . Cancer Father     Allergies  Allergen Reactions  . Tylenol [Acetaminophen] Shortness Of Breath and Swelling  . Ultram [Tramadol Hcl]   . Aripiprazole   . Cefaclor   . Diclofenac Sodium   . Ibuprofen   . Naproxen Sodium     Medication list has been reviewed and updated.  Review of Systems:  GEN: No fevers, chills. Nontoxic. Primarily MSK c/o today. MSK:  Detailed in the HPI GI: tolerating PO intake without difficulty Neuro: No numbness, parasthesias, or tingling associated. Otherwise the pertinent positives of the ROS are noted above.   Objective:   Physical Examination: BP 110/78  Pulse 98  Temp(Src) 98.8 F (37.1 C) (Oral)  Ht 5\' 9"  (1.753 m)  Wt 216 lb 12 oz (98.317 kg)  BMI 31.99 kg/m2  LMP 09/05/2013  Ideal Body Weight: Weight in (lb) to have BMI = 25: 168.9   GEN: WDWN, NAD, Non-toxic, Alert & Oriented x 3 HEENT: Atraumatic, Normocephalic.  Ears and Nose: No external deformity. EXTR: No clubbing/cyanosis/edema NEURO:  Normal gait.  PSYCH: Normally interactive. Conversant. Not depressed or anxious appearing.  Calm demeanor.   HIP EXAM: SIDE: R ROM: Abduction, Flexion, Internal and External range of motion: Approximate 30% loss of internal Range of motion, some pain with abduction and a positive C. sign. Pain with terminal IROM and EROM: yes GTB: TTP B SLR: NEG Knees: No effusion FABER: NT REVERSE FABER: NT, neg Piriformis: NT at direct palpation Str: flexion: 4/5 abduction: 4/5 adduction: 5/5 Strength testing non-tender     No results found.  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 Check slowly had a CT of her abdomen and pelvis recently otherwise urology, but those films are not available to me.  Ongoing right trochanteric bursitis likely is a result of ongoing true osteoarthritis of the right hip as well as chronic pain from multiple areas and altered gait and weakness. Home situation limits ability to fully rehabbed this and for full valuation. If she has time, a fluoroscopic an ejection in her intra-articular space on the right hip would be reasonable.  Refill her oxycodone, changing to 15 mg. We are going to try to decrease her oxycodone usage and I have increased her quantity of Zanaflex to #90 to try to decrease her opioid use for pain management.  Trochanteric Bursitis Injection, R Verbal consent obtained. Risks (including infection, potential atrophy), benefits, and alternatives reviewed. Greater trochanter sterilely prepped with Chloraprep. Ethyl Chloride used for anesthesia. 8 cc of Lidocaine 1% injected with 2 cc of 40 mg Depo-Medrol into trochanteric bursa at area of maximal tenderness at greater trochanter. Needle taken to bone to troch bursa, flows easily. Bursa  massaged. No bleeding and no complications. Decreased pain after injection. Needle: 22 gauge spinal needle   There are no Patient Instructions on file for this visit.  Orders Today:  No orders of the defined types were placed in this encounter.    New medications, updates to list, dose adjustments: Meds ordered this encounter  Medications  . aspirin 325 MG tablet    Sig: Take 325 mg by mouth as needed.  Marland Kitchen oxyCODONE (ROXICODONE) 15 MG immediate release tablet    Sig: Take 1 tablet (15 mg total) by mouth every 6 (six) hours as needed for pain.    Dispense:  90 tablet    Refill:  0  . diclofenac sodium (VOLTAREN) 1 % GEL    Sig: Apply 4 g topically 4 (four) times daily.    Dispense:  500 g    Refill:  5  . tiZANidine (ZANAFLEX) 4 MG tablet    Sig: Take 1 tablet by mouth every 6 hours as needed    Dispense:  90 tablet    Refill:  0    Order Specific Question:  Supervising Provider    Answer:  Dianne Dun [[id number]]    Signed,  Elpidio Galea Vestal Crandall, MD, CAQ Sports Medicine  [facility] at [facility] [address] [place] [address] Phone: [phone] Fax: [phone]  Updated Complete Medication List:   Medication List       This list is accurate as of: 10/04/13  2:11 PM.  Always use your most recent med list.               aspirin 325 MG tablet  Take 325 mg by mouth as needed.     BIOTIN PO  Take 600 mg by mouth daily.     calcium carbonate 500 MG chewable tablet  Commonly known as:  TUMS - dosed in mg elemental calcium  Chew 3 tablets by mouth daily.     cetirizine 10 MG tablet  Commonly known as:  ZYRTEC  Take 10 mg by mouth daily.     diclofenac sodium 1 % Gel  Commonly known as:  VOLTAREN  Apply 4 g topically 4 (four) times daily.     DULoxetine 60 MG capsule  Commonly known as:  CYMBALTA  Take 1 capsule (60 mg total) by mouth daily.     hydrOXYzine 25 MG tablet  Commonly known as:  ATARAX/VISTARIL  Take 2 by mouth at bedtime      lamoTRIgine 150 MG tablet  Commonly known as:  LAMICTAL  Take two tablets by mouth daily     levalbuterol 45 MCG/ACT inhaler  Commonly known as:  XOPENEX HFA  Inhale 1-2 puffs into the lungs every 4 (four) hours as needed.     Lysine 1000 MG Tabs  Take 1,000 mg by mouth daily.     methylphenidate 10 MG tablet  Commonly known as:  RITALIN  Take 10 mg by mouth as needed.     MYRBETRIQ 25 MG Tb24 tablet  Generic drug:  mirabegron ER  Take 25 mg by mouth daily.     oxyCODONE 15 MG immediate release tablet  Commonly known as:  ROXICODONE  Take 1 tablet (15 mg total) by mouth every 6 (six) hours as needed for pain.     silodosin 4 MG Caps capsule  Commonly known as:  RAPAFLO  Take 4 mg by mouth daily with breakfast.     temazepam 30 MG capsule  Commonly known as:  RESTORIL  TAKE ONE TO TWO CAPSULES BY MOUTH NIGHTLY AT BEDTIME     tiZANidine 4 MG tablet  Commonly known as:  ZANAFLEX  Take 1 tablet by mouth every 6 hours as needed     VITAMIN D-3 PO  Take 6,000 mg by mouth daily.

## 2013-10-04 NOTE — Progress Notes (Signed)
Pre-visit discussion using our clinic review tool. No additional management support is needed unless otherwise documented below in the visit note.  

## 2013-10-10 ENCOUNTER — Other Ambulatory Visit: Payer: Self-pay | Admitting: *Deleted

## 2013-10-10 MED ORDER — LAMOTRIGINE 150 MG PO TABS: ORAL_TABLET | ORAL | Status: DC

## 2013-10-10 NOTE — Telephone Encounter (Signed)
Last office visit 11.19.2014 with Dr. Patsy Lager.  Ok to refill?

## 2013-10-13 ENCOUNTER — Encounter: Payer: Self-pay | Admitting: Family Medicine

## 2013-10-13 ENCOUNTER — Ambulatory Visit (INDEPENDENT_AMBULATORY_CARE_PROVIDER_SITE_OTHER): Payer: BC Managed Care – PPO | Admitting: Family Medicine

## 2013-10-13 VITALS — BP 110/80 | HR 93 | Temp 99.1°F | Ht 69.0 in | Wt 213.2 lb

## 2013-10-13 DIAGNOSIS — M67952 Unspecified disorder of synovium and tendon, left thigh: Secondary | ICD-10-CM

## 2013-10-13 DIAGNOSIS — G894 Chronic pain syndrome: Secondary | ICD-10-CM

## 2013-10-13 DIAGNOSIS — M25552 Pain in left hip: Secondary | ICD-10-CM

## 2013-10-13 DIAGNOSIS — M6790 Unspecified disorder of synovium and tendon, unspecified site: Secondary | ICD-10-CM

## 2013-10-13 DIAGNOSIS — M7062 Trochanteric bursitis, left hip: Secondary | ICD-10-CM

## 2013-10-13 DIAGNOSIS — M1990 Unspecified osteoarthritis, unspecified site: Secondary | ICD-10-CM

## 2013-10-13 DIAGNOSIS — M76899 Other specified enthesopathies of unspecified lower limb, excluding foot: Secondary | ICD-10-CM

## 2013-10-13 DIAGNOSIS — M25559 Pain in unspecified hip: Secondary | ICD-10-CM

## 2013-10-13 NOTE — Progress Notes (Signed)
Date:  10/13/2013   Name:  Emily Moon   DOB:  [birth year]   MRN:  [id number] Gender: female Age: 52 y.o.  Primary Physician:  Ruthe Mannan, MD   Chief Complaint: Hip Pain   Subjective:   History of Present Illness:  Emily Moon is a 52 y.o. very pleasant female patient who presents with the following:  Patient with multiple medical problems including osteoarthritis secondary hip dysplasia and is status post total hip arthroplasty on the left who has recently come to see me several times with some chronic bursitis and tendinopathy of the hips. Over the holiday she was moving about excessively and now she has developed a lateral hip pain in the trochanteric bursa region as well as the gluteus medius region. She is not having any true hip pain, and this is on the side with her prosthetic hip.  Past Medical History, Surgical History, Social History, Family History, Problem List, Medications, and Allergies have been reviewed and updated if relevant.  Review of Systems:  GEN: No fevers, chills. Nontoxic. Primarily MSK c/o today. MSK: Detailed in the HPI GI: tolerating PO intake without difficulty Neuro: No numbness, parasthesias, or tingling associated. Otherwise the pertinent positives of the ROS are noted above.   Objective:   Physical Examination: BP 110/80  Pulse 93  Temp(Src) 99.1 F (37.3 C) (Oral)  Ht 5\' 9"  (1.753 m)  Wt 213 lb 4 oz (96.73 kg)  BMI 31.48 kg/m2  LMP 09/05/2013   GEN: WDWN, NAD, Non-toxic, Alert & Oriented x 3 HEENT: Atraumatic, Normocephalic.  Ears and Nose: No external deformity. EXTR: No clubbing/cyanosis/edema NEURO: Normal gait.  PSYCH: Normally interactive. Conversant. Not depressed or anxious appearing.  Calm demeanor.   HIP EXAM: SIDE: B ROM: Abduction, Flexion, Internal and External range of motion: Extensive range of motion is not attempted on the left, but grossly moves normally. On the right there is only minimal loss of motion  with terminal internal and external range of motion. Pain with terminal IROM and EROM: Minimal GTB: Notably on the left and just posterior to this. The gluteus medius insertion. SLR: NEG Knees: No effusion Piriformis: NT at direct palpation Str: flexion: 5/5 abduction: 4/5 adduction: 5/5 Strength testing non-tender  Assessment & Plan:    Trochanteric bursitis of left hip - Plan: Ambulatory referral to Physical Therapy  Tendinopathy of left gluteus medius - Plan: Ambulatory referral to Physical Therapy  OSTEOARTHRITIS  Chronic pain syndrome  Left hip pain  >25 minutes spent in face to face time with patient, >50% spent in counselling or coordination of care: We talked at length about all these things including her family psychosocial stressors, her mother on hospice, and her sister with a current hip fracture. She asked my advice about potential hip surgeons in the area. I told her that I would help manage her chronic pain management until her primary care physician returns from maternity leave.  She clearly has some trochanteric bursitis on the left, and I think that to help with this recurring problem, I suggested that she go to formal physical therapy. She does not remember any of her rehabilitation from the postoperative period.  Trochanteric Bursitis Injection, LEFT Verbal consent obtained. Risks (including infection, potential atrophy), benefits, and alternatives reviewed. Greater trochanter sterilely prepped with Chloraprep. Ethyl Chloride used for anesthesia. 8 cc of Lidocaine 1% injected with 2 cc of 40 mg Depo-Medrol into trochanteric bursa at area of maximal tenderness at greater trochanter. Needle taken to bone to troch bursa,  flows easily. Bursa massaged. No bleeding and no complications. Decreased pain after injection. Needle: 22 gauge spinal needle   Orders Today:  Orders Placed This Encounter  Procedures  . Ambulatory referral to Physical Therapy    New  medications, updates to list, dose adjustments: No orders of the defined types were placed in this encounter.    Signed,  Elpidio Galea. Terrilee Dudzik, MD, CAQ Sports Medicine  [facility] [address] [place] [address] Phone: [phone] Fax: [phone]  Updated Complete Medication List:   Medication List       This list is accurate as of: 10/13/13  4:59 PM.  Always use your most recent med list.               aspirin 325 MG tablet  Take 325 mg by mouth as needed.     BIOTIN PO  Take 600 mg by mouth daily.     calcium carbonate 500 MG chewable tablet  Commonly known as:  TUMS - dosed in mg elemental calcium  Chew 3 tablets by mouth daily.     cetirizine 10 MG tablet  Commonly known as:  ZYRTEC  Take 10 mg by mouth daily.     diclofenac sodium 1 % Gel  Commonly known as:  VOLTAREN  Apply 4 g topically 4 (four) times daily.     DULoxetine 60 MG capsule  Commonly known as:  CYMBALTA  Take 1 capsule (60 mg total) by mouth daily.     hydrOXYzine 25 MG tablet  Commonly known as:  ATARAX/VISTARIL  Take 2 by mouth at bedtime     lamoTRIgine 150 MG tablet  Commonly known as:  LAMICTAL  Take two tablets by mouth daily     levalbuterol 45 MCG/ACT inhaler  Commonly known as:  XOPENEX HFA  Inhale 1-2 puffs into the lungs every 4 (four) hours as needed.     Lysine 1000 MG Tabs  Take 1,000 mg by mouth daily.     methylphenidate 10 MG tablet  Commonly known as:  RITALIN  Take 10 mg by mouth as needed.     MYRBETRIQ 25 MG Tb24 tablet  Generic drug:  mirabegron ER  Take 25 mg by mouth daily.     oxyCODONE 15 MG immediate release tablet  Commonly known as:  ROXICODONE  Take 1 tablet (15 mg total) by mouth every 6 (six) hours as needed for pain.     silodosin 4 MG Caps capsule  Commonly known as:  RAPAFLO  Take 4 mg by mouth daily with breakfast.     temazepam 30 MG capsule  Commonly known as:  RESTORIL  TAKE ONE TO TWO CAPSULES BY  MOUTH NIGHTLY AT BEDTIME     tiZANidine 4 MG tablet  Commonly known as:  ZANAFLEX  Take 1 tablet by mouth every 6 hours as needed     VITAMIN D-3 PO  Take 6,000 mg by mouth daily.

## 2013-10-13 NOTE — Progress Notes (Signed)
Pre-visit discussion using our clinic review tool. No additional management support is needed unless otherwise documented below in the visit note.  

## 2013-10-19 ENCOUNTER — Ambulatory Visit: Payer: BC Managed Care – PPO | Attending: Family Medicine

## 2013-10-19 DIAGNOSIS — M25559 Pain in unspecified hip: Secondary | ICD-10-CM | POA: Insufficient documentation

## 2013-10-19 DIAGNOSIS — IMO0001 Reserved for inherently not codable concepts without codable children: Secondary | ICD-10-CM | POA: Insufficient documentation

## 2013-10-19 DIAGNOSIS — R262 Difficulty in walking, not elsewhere classified: Secondary | ICD-10-CM | POA: Insufficient documentation

## 2013-10-24 ENCOUNTER — Encounter: Payer: BC Managed Care – PPO | Admitting: Physical Therapy

## 2013-10-27 ENCOUNTER — Telehealth: Payer: Self-pay

## 2013-10-27 ENCOUNTER — Other Ambulatory Visit: Payer: Self-pay | Admitting: Internal Medicine

## 2013-10-27 MED ORDER — LEVALBUTEROL TARTRATE 45 MCG/ACT IN AERO: 1.0000 | INHALATION_SPRAY | RESPIRATORY_TRACT | Status: DC | PRN

## 2013-10-27 NOTE — Telephone Encounter (Signed)
I have no idea about the prior auth. Not sure where the paperwork is. OK to refill xopenex

## 2013-10-27 NOTE — Telephone Encounter (Signed)
We can leave this to the patient's discretion, and i advised her to seek asap medical after falling down stairs.

## 2013-10-27 NOTE — Telephone Encounter (Signed)
Spoke with Eber Jones.  She is able to ambulate but is in some pain.  She really wants to wait and see Dr. Patsy Lager on Monday.  Advised to try and take it easy this weekend but if the pain gets really bad she will need to be evaluated somewhere over the weekend.  She said she probably wouldn't go anywhere else to be evaluated because she really wants to wait and see Dr. Patsy Lager on Monday.

## 2013-10-27 NOTE — Telephone Encounter (Signed)
Pt said she fell down stairs last night and having rt wrist, rt hip pain and neck pain. Pt said she does not feel like getting out today and only wants to see Dr Patsy Lager. Pt scheduled appt with Dr Patsy Lager 10/30/13 at 11:30am.

## 2013-10-27 NOTE — Telephone Encounter (Signed)
Please call and check on her. I will not be in office until Monday.  If she is having trouble bearing weight at all or is having any kind of significant pain in the wrist or neck, then she needs to be evaluated ASAP.

## 2013-10-27 NOTE — Telephone Encounter (Signed)
Pt did not receive [employer] application in mail; pt request to be remailed to confirmed home address. Form mailed and when receive back will attach to form being held by [place] and faxed to [place].

## 2013-10-27 NOTE — Telephone Encounter (Signed)
Pt left v/m requesting refill xopenex to [facility].Please advise. Pt also request status of prior auth for Voltaren gel.

## 2013-10-30 ENCOUNTER — Ambulatory Visit (INDEPENDENT_AMBULATORY_CARE_PROVIDER_SITE_OTHER): Payer: BC Managed Care – PPO | Admitting: Family Medicine

## 2013-10-30 ENCOUNTER — Ambulatory Visit (INDEPENDENT_AMBULATORY_CARE_PROVIDER_SITE_OTHER)
Admission: RE | Admit: 2013-10-30 | Discharge: 2013-10-30 | Disposition: A | Discharge status: Home / Self Care | Payer: BC Managed Care – PPO | Source: Ambulatory Visit | Attending: Family Medicine | Admitting: Family Medicine

## 2013-10-30 ENCOUNTER — Encounter: Payer: Self-pay | Admitting: Family Medicine

## 2013-10-30 VITALS — BP 110/80 | HR 91 | Temp 99.7°F | Ht 69.0 in | Wt 208.5 lb

## 2013-10-30 DIAGNOSIS — M25531 Pain in right wrist: Secondary | ICD-10-CM

## 2013-10-30 DIAGNOSIS — M25539 Pain in unspecified wrist: Secondary | ICD-10-CM

## 2013-10-30 DIAGNOSIS — M549 Dorsalgia, unspecified: Secondary | ICD-10-CM

## 2013-10-30 DIAGNOSIS — G894 Chronic pain syndrome: Secondary | ICD-10-CM

## 2013-10-30 DIAGNOSIS — M542 Cervicalgia: Secondary | ICD-10-CM

## 2013-10-30 IMAGING — CR DG WRIST COMPLETE 3+V*R*
2 series · 2 of 2 positions shown · non-contrast
Comparison: None.

CLINICAL DATA: Trauma.  Pain.

EXAM:
RIGHT WRIST - COMPLETE 3+ VIEW

[view not recorded (1 of 2)]
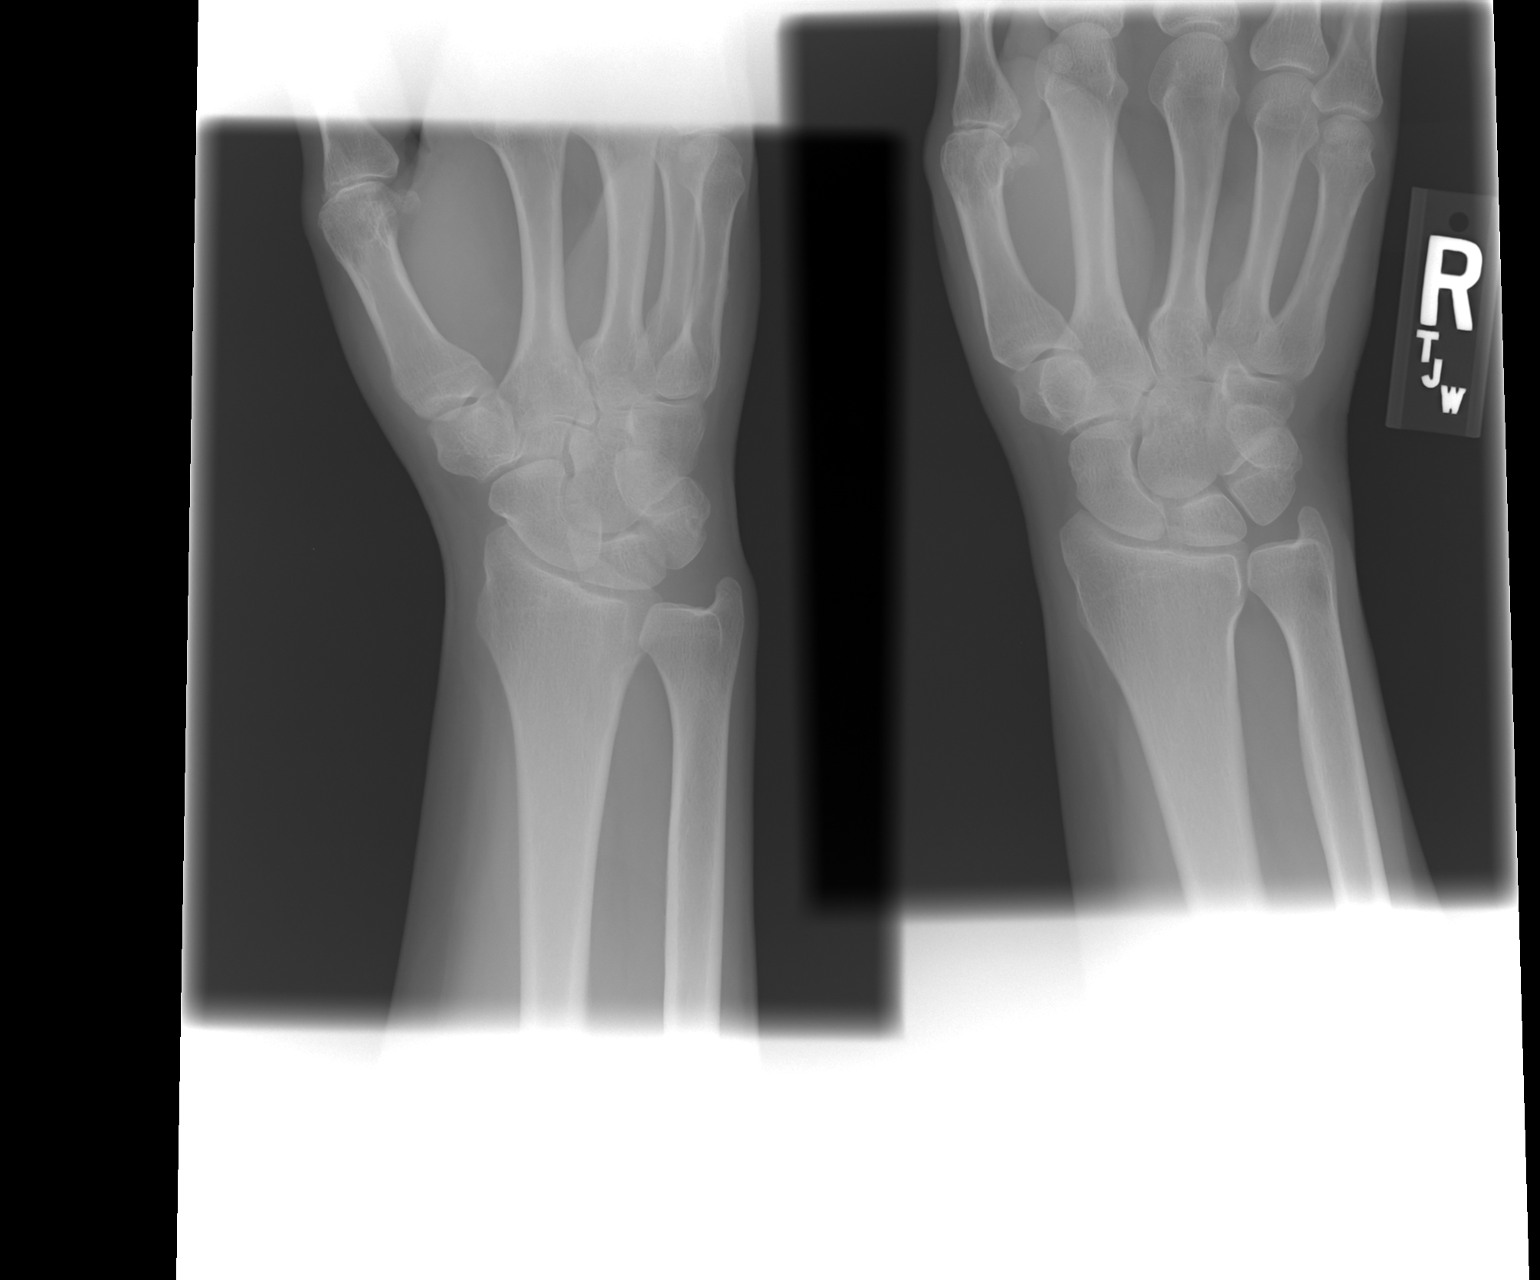

[view not recorded (2 of 2)]
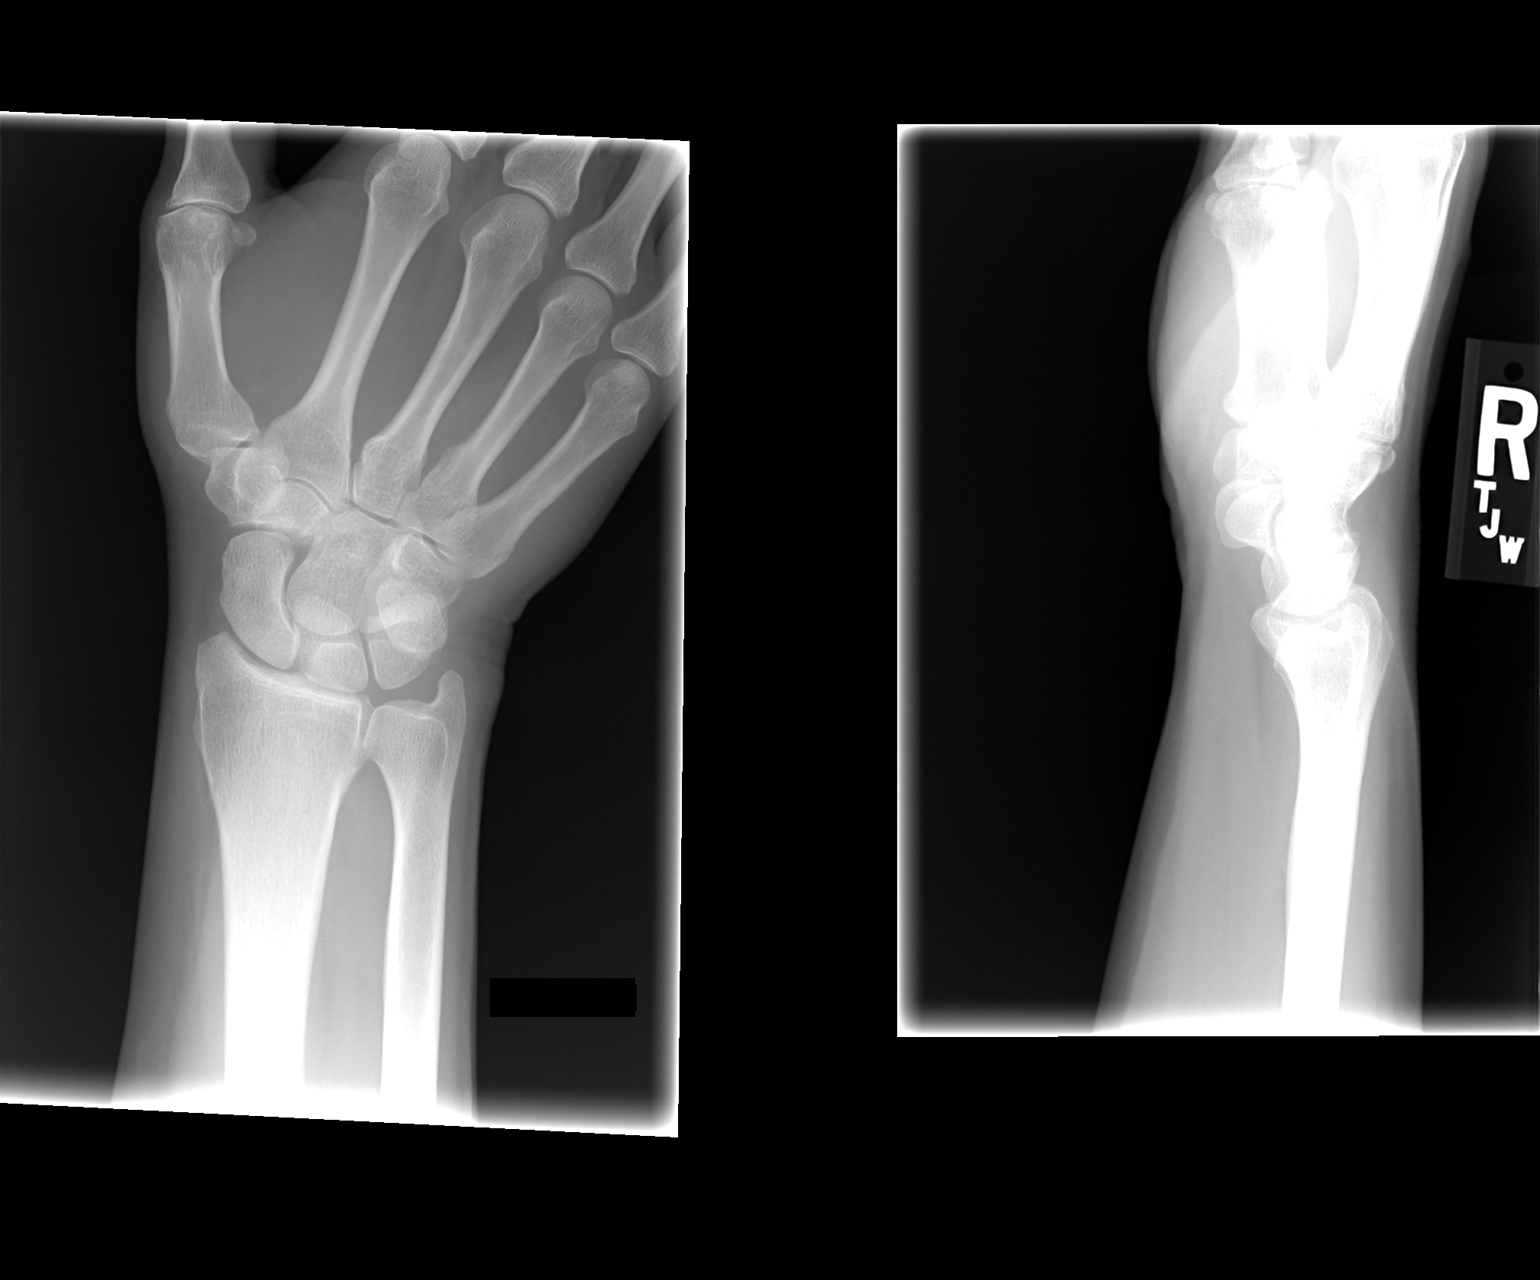

[2 of 2 positions shown; findings below may reference images not displayed]

FINDINGS: No fracture or dislocation.

No scaphoid fracture detected. If there were persistent scaphoid
region tenderness, then followup plain film examination in 7-10 days
or MR could be obtained to exclude occult scaphoid injury.
IMPRESSION: No fracture or dislocation.

Please see above

## 2013-10-30 MED ORDER — OXYCODONE HCL 15 MG PO TABS: 15.0000 mg | ORAL_TABLET | Freq: Four times a day (QID) | ORAL | Status: DC | PRN

## 2013-10-30 NOTE — Telephone Encounter (Signed)
Pt checking on status of refill; spoke with Victorino Dike at [facility] and did receive refill. Pt notified.

## 2013-10-30 NOTE — Telephone Encounter (Signed)
Rx called in to pharmacy. 

## 2013-10-30 NOTE — Telephone Encounter (Signed)
Lyla Son did you send this to be scanned ?

## 2013-10-30 NOTE — Telephone Encounter (Signed)
Ok to phone in temazepam 

## 2013-10-30 NOTE — Progress Notes (Signed)
Date:  10/30/2013   Name:  Emily Moon   DOB:  [birth year]   MRN:  [id number] Gender: female Age: 52 y.o.  Primary Physician:  Ruthe Mannan, MD   Chief Complaint: Fall   Subjective:   History of Present Illness:  Emily Moon is a 52 y.o. pleasant patient who presents with the following:  Larey Seat down 5 steps and is really hurting all over the body. I will, and the patient fell down her steps at home. Currently she is having pain in bilateral wrists, some pain in the posterior aspect of her bottom, and she is also having some pain in her neck and her lower back. She also has a headache. She has had normal mentation throughout, and has not had any other gross neurological deficits.  Both wrist,  Hit butt.  Moving her mother up and down bed.   Also hit her head. Has a bad headache. She denies nausea, other emotional lability, other neurological defects, she is seeing clearly. Her balance is at baseline.  R > L wrist R shoulder Lower back is hurting some also  She reports having had some swelling in her RIGHT wrist immediately following the accident, but it is improved now.   Patient Active Problem List   Diagnosis Date Noted  . 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 Insomnia 09/01/2011  . Osteoarthritis of hip 08/05/2011  . Left hip pain 06/03/2011  . Obesity 05/04/2011  . Perimenopausal vasomotor symptoms 02/05/2011  . HYPERLIPIDEMIA 01/20/2011  . SMOKER 01/20/2011  . PALPITATIONS 01/16/2011  . Chronic pain syndrome 01/07/2011  . RENAL CALCULUS, RECURRENT 11/13/2010  . SYNCOPE 10/23/2010  . OTHER AND UNSPECIFIED BIPOLAR DISORDERS 08/28/2010  . DEPRESSION 08/28/2010  . MULTIPLE SCLEROSIS, PROGRESSIVE/RELAPSING 08/28/2010  . GERD 08/28/2010  . OSTEOARTHRITIS 08/28/2010  . NEPHROLITHIASIS, HX OF 08/28/2010    Past Medical History  Diagnosis Date  . Bipolar disorder   . Multiple sclerosis   .  Depression   . GERD (gastroesophageal reflux disease)   . Nephrolithiasis   . Osteoporosis     osteoarthritis  . Fibromyalgia syndrome   . Hip dysplasia, 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    Past Surgical History  Procedure Laterality Date  . Tonsillectomy    . Joint replacement Left 2013    hip replacement    History   Social History  . Marital Status: Divorced    Spouse Name: N/A    Number of Children: 1  . Years of Education: N/A   Occupational History  . 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 Smoking status: Current Some Day Smoker -- 0.50 packs/day for 29 years    Types: Cigarettes  . Smokeless tobacco: Never Used  . Alcohol Use: No  . Drug Use: No  . Sexual Activity: Not on file   Other Topics Concern  . Not on file   Social History Narrative  . No narrative on file    Family History  Problem Relation Age of Onset  . Cancer Father     Allergies  Allergen Reactions  . Tylenol [Acetaminophen] Shortness Of Breath and Swelling  . Ultram [Tramadol Hcl]   . Aripiprazole   . Cefaclor   . Diclofenac Sodium   . Ibuprofen   . Naproxen Sodium     Medication list has been reviewed and updated.  Review of Systems:  GEN: No fevers, chills. Nontoxic. Primarily MSK c/o today.  MSK: Detailed in the HPI GI: tolerating PO intake without difficulty Neuro: No numbness, parasthesias, or tingling associated. Otherwise the pertinent positives of the ROS are noted above.   Objective:   Physical Examination: BP 110/80  Pulse 91  Temp(Src) 99.7 F (37.6 C) (Oral)  Ht 5\' 9"  (1.753 m)  Wt 208 lb 8 oz (94.575 kg)  BMI 30.78 kg/m2  LMP 09/05/2013  Ideal Body Weight: Weight in (lb) to have BMI = 25: 168.9   GEN: WDWN, NAD, Non-toxic, Alert & Oriented x 3 HEENT: Atraumatic, Normocephalic.  Ears and Nose: No external deformity. EXTR: No clubbing/cyanosis/edema NEURO: mildly antalgic. PSYCH: Normally interactive. Conversant. Not  depressed or anxious appearing.  Calm demeanor.   There is a gross deficit of motion cervical spine from 50-70 percent in all directions. At baseline the patient is soft significant loss. There is no neurovascular deficit in the upper extremities. There is some mild tenderness posteriorly in the paracervical region and muscular para cervical musculature.  RIGHT shoulder, strength is preserved and is 5/5 in all directions. Motion is preserved. Mild pain with Neer testing and Leanord Asal testing. Nontender at the a.c. Joint. Nontender along the clavicle and at and along the humerus.  Full range of motion at the elbows bilaterally.  Hands and wrists are nontender along bony anatomy. The scaphoid is nontender. Both of the hamate is nontender. Nontender at the distal radius and distal ulna. There is some pain and tenderness with axial loading. Grip is approaching normal.  Patient also has some tenderness to palpation in the posterior pelvis and the soft tissue in the upper gluteus region as well as the erector spinae complex.  Dg Wrist Complete Right  10/30/2013   CLINICAL DATA:  Trauma.  Pain.  EXAM: RIGHT WRIST - COMPLETE 3+ VIEW  COMPARISON:  None.  FINDINGS: No fracture or dislocation.  No scaphoid fracture detected. If there were persistent scaphoid region tenderness, then followup plain film examination in 7-10 days or MR could be obtained to exclude occult scaphoid injury.  IMPRESSION: No fracture or dislocation.  Please see above   Electronically Signed   By: Bridgett Larsson M.D.   On: 10/30/2013 13:31    Assessment & Plan:    Wrist pain, right - Plan: DG Wrist Complete Right  Neck pain  Back pain  Chronic pain syndrome  She is injured multiple areas including all the above and not limited to above, may also have mildly injured her shoulder, but doubtful of any rotator cuff injury, and exam are consistent with fracture.  She can continue with her pain medication, Tylenol, and  Voltaren gel.  She will follow up with her regular primary care doctor when she returns from maternity leave. I will refill her narcotics today.  There are no Patient Instructions on file for this visit.  Orders Today:  Orders Placed This Encounter  Procedures  . DG Wrist Complete Right    New medications, updates to list, dose adjustments: Meds ordered this encounter  Medications  . oxyCODONE (ROXICODONE) 15 MG immediate release tablet    Sig: Take 1 tablet (15 mg total) by mouth every 6 (six) hours as needed for pain.    Dispense:  90 tablet    Refill:  0    Signed,  Kassidie Hendriks T. Chistine Dematteo, MD, CAQ Sports Medicine  [facility] [address] [place] [address] Phone: [phone] Fax: [phone]  Updated Complete Medication List:   Medication List  This list is accurate as of: 10/30/13 11:59 PM.  Always use your most recent med list.               aspirin 325 MG tablet  Take 325 mg by mouth as needed.     BIOTIN PO  Take 600 mg by mouth daily.     calcium carbonate 500 MG chewable tablet  Commonly known as:  TUMS - dosed in mg elemental calcium  Chew 3 tablets by mouth daily.     cetirizine 10 MG tablet  Commonly known as:  ZYRTEC  Take 10 mg by mouth daily.     diclofenac sodium 1 % Gel  Commonly known as:  VOLTAREN  Apply 4 g topically 4 (four) times daily.     DULoxetine 60 MG capsule  Commonly known as:  CYMBALTA  Take 1 capsule (60 mg total) by mouth daily.     hydrOXYzine 25 MG tablet  Commonly known as:  ATARAX/VISTARIL  Take 2 by mouth at bedtime     lamoTRIgine 150 MG tablet  Commonly known as:  LAMICTAL  Take two tablets by mouth daily     levalbuterol 45 MCG/ACT inhaler  Commonly known as:  XOPENEX HFA  Inhale 1-2 puffs into the lungs every 4 (four) hours as needed.     Lysine 1000 MG Tabs  Take 1,000 mg by mouth daily.     methylphenidate 10 MG tablet  Commonly known as:  RITALIN  Take 10 mg by  mouth as needed.     MYRBETRIQ 25 MG Tb24 tablet  Generic drug:  mirabegron ER  Take 25 mg by mouth daily.     oxyCODONE 15 MG immediate release tablet  Commonly known as:  ROXICODONE  Take 1 tablet (15 mg total) by mouth every 6 (six) hours as needed for pain.     silodosin 4 MG Caps capsule  Commonly known as:  RAPAFLO  Take 4 mg by mouth daily with breakfast.     temazepam 30 MG capsule  Commonly known as:  RESTORIL  TAKE ONE TO TWO CAPSULES BY MOUTH NIGHTLY AT BEDTIME     tiZANidine 4 MG tablet  Commonly known as:  ZANAFLEX  Take 1 tablet by mouth every 6 hours as needed     VITAMIN D-3 PO  Take 6,000 mg by mouth daily.

## 2013-10-30 NOTE — Progress Notes (Signed)
Pre-visit discussion using our clinic review tool. No additional management support is needed unless otherwise documented below in the visit note.  

## 2013-10-31 ENCOUNTER — Encounter: Payer: BC Managed Care – PPO | Admitting: Rehabilitation

## 2013-10-31 NOTE — Telephone Encounter (Addendum)
Medication was approved by insurance from 10/31/13-11/15/2038., pharmacy notified via fax

## 2013-10-31 NOTE — Telephone Encounter (Signed)
Pt notified of approval

## 2013-10-31 NOTE — Telephone Encounter (Signed)
Check with Rodney Booze she does the prior authorizations.

## 2013-11-02 ENCOUNTER — Encounter: Payer: BC Managed Care – PPO | Admitting: Rehabilitation

## 2013-11-06 ENCOUNTER — Telehealth: Payer: Self-pay

## 2013-11-06 ENCOUNTER — Other Ambulatory Visit: Payer: Self-pay | Admitting: Internal Medicine

## 2013-11-06 MED ORDER — TEMAZEPAM 30 MG PO CAPS: ORAL_CAPSULE | ORAL | Status: DC

## 2013-11-06 NOTE — Telephone Encounter (Signed)
Pt left v/m pt received refill of temazepam 11/06/13 for # 16. Pt usually gets # 60 and pt request # 44 called to [facility]. Pt request cb.

## 2013-11-06 NOTE — Addendum Note (Signed)
Addended by: Patience Musca on: 11/06/2013 04:04 PM   Modules accepted: Orders

## 2013-11-06 NOTE — Telephone Encounter (Addendum)
Rx called in to pharmacy. 

## 2013-11-06 NOTE — Telephone Encounter (Signed)
Ok - please call this in

## 2013-11-06 NOTE — Telephone Encounter (Signed)
Pt called back and another rx of #16 was at [facility].spoke with Archie Patten at [facility] and pt did not pick up # 16 and is due for # 60 if Oked by Nicki Reaper NP. Spoke with Rene Kocher and she verbally Oked # 60.Medication phoned to [facility] pharmacy as instructed.  Pt appreciative.

## 2013-11-06 NOTE — Telephone Encounter (Signed)
Rx called in to pharmacy. 

## 2013-11-14 NOTE — Telephone Encounter (Signed)
Spoke with pt and she did receive application but has not filled it out yet.

## 2013-11-16 HISTORY — PX: FOOT SURGERY: SHX648

## 2013-11-16 HISTORY — PX: EYE SURGERY: SHX253

## 2013-11-20 ENCOUNTER — Ambulatory Visit: Payer: BC Managed Care – PPO | Admitting: Family Medicine

## 2013-11-20 ENCOUNTER — Telehealth: Payer: Self-pay

## 2013-11-20 DIAGNOSIS — M1631 Unilateral osteoarthritis resulting from hip dysplasia, right hip: Secondary | ICD-10-CM

## 2013-11-20 MED ORDER — OXYCODONE HCL 15 MG PO TABS: 15.0000 mg | ORAL_TABLET | Freq: Four times a day (QID) | ORAL | Status: DC | PRN

## 2013-11-20 NOTE — Telephone Encounter (Signed)
She can get a dedicated hip film if she would like.   We can arrange a fluoro guided injection if she would like.  Cc: Dr. Deborra Medina

## 2013-11-20 NOTE — Addendum Note (Signed)
Addended by: Owens Loffler on: 11/20/2013 04:38 PM   Modules accepted: Orders

## 2013-11-20 NOTE — Telephone Encounter (Signed)
Fluoro order done.

## 2013-11-20 NOTE — Telephone Encounter (Signed)
Please ask pt to make a 30 min follow up appt with me this month.

## 2013-11-20 NOTE — Telephone Encounter (Signed)
Pt has to drive to [facility] on 86/75/44 returning home on 11/26/13. Pt wants to know if should go back for physical therapy for rt hip pain.pt also request appt for xray guided injection for hip pain scheduled upon pts return next week. Pt will be out of pain med on 11/21/13 and request rx oxycodone. If will give pain med call pt when ready for pick up. Pt request cb.

## 2013-11-20 NOTE — Telephone Encounter (Signed)
Ok to refill one month of oxycodone but she needs to see me before I can give further refills.

## 2013-11-20 NOTE — Telephone Encounter (Signed)
Spoke to pt and informed her that Rx is available at the front desk for pickup;informed that a govt issued photo id is required for pickup

## 2013-11-20 NOTE — Telephone Encounter (Signed)
Pt called to ck if pain was called in or does pt have to pick up rx. Advised pt oxycodone rx is at front desk for pick up. Pt scheduled 30 min appt with Dr Deborra Medina on 11/29/13 at 10 AM. Pt will discuss referral for guided injection at that time.

## 2013-11-20 NOTE — Telephone Encounter (Signed)
Pt called back pt having problems sitting due to pain; pain level now is 8.5 into groin and upper leg; pt crying while on phone. Pt request our office to try to get a referral for guided hip injection for 11/21/13 and needs to pick up oxycodone rx by 11/21/13; pt said her sister manages pain medication. Pt's son is being deployed on 11/22/13 from [place] and pts mother's health is declining. Pt request cb as soon as possible.

## 2013-11-20 NOTE — Telephone Encounter (Signed)
There is no way she can get a fluoro guided hip injection scheduled for tomorrow. Nothing moves that quickly.  Cc: Dr. Deborra Medina

## 2013-11-20 NOTE — Telephone Encounter (Signed)
i am going to involve her PCP regarding her pain medications. She is back from maternity leave and will be coordinating all prescriptions from this point forward.

## 2013-11-20 NOTE — Telephone Encounter (Signed)
Pt left v/m; pain has increased and pt wants to get fluoro guided injection on 11/21/13 and request cb ASAP.

## 2013-11-29 ENCOUNTER — Ambulatory Visit (INDEPENDENT_AMBULATORY_CARE_PROVIDER_SITE_OTHER): Payer: BC Managed Care – PPO | Admitting: Family Medicine

## 2013-11-29 VITALS — BP 118/72 | HR 88 | Temp 98.6°F | Wt 213.5 lb

## 2013-11-29 DIAGNOSIS — F4321 Adjustment disorder with depressed mood: Secondary | ICD-10-CM

## 2013-11-29 DIAGNOSIS — G894 Chronic pain syndrome: Secondary | ICD-10-CM

## 2013-11-29 DIAGNOSIS — M167 Other unilateral secondary osteoarthritis of hip: Secondary | ICD-10-CM

## 2013-11-29 DIAGNOSIS — M1631 Unilateral osteoarthritis resulting from hip dysplasia, right hip: Secondary | ICD-10-CM

## 2013-11-29 MED ORDER — TIZANIDINE HCL 4 MG PO TABS: ORAL_TABLET | ORAL | Status: DC

## 2013-11-29 MED ORDER — OXYCODONE HCL 15 MG PO TABS: 15.0000 mg | ORAL_TABLET | Freq: Three times a day (TID) | ORAL | Status: DC | PRN

## 2013-11-29 NOTE — Assessment & Plan Note (Signed)
Agreed to refill her medications but I did advise that after her hip injections, we will want to try to wean this down again. No longer lifting her mom to care for her so hopefully pain will improve. The patient indicates understanding of these issues and agrees with the plan.

## 2013-11-29 NOTE — Patient Instructions (Signed)
I'm so sorry for you loss. Please call Rosaria Ferries.  You can set up an appointment with Dr. Lorelei Pont for shoulder injection.

## 2013-11-29 NOTE — Assessment & Plan Note (Signed)
>  25 min spent with face to face with patient, >50% counseling and/or coordinating care Appropriate grief but given her history of psychiatric problems and how close she was to her mom, I do think seeing a therapist is prudent.  Ms. Fulgham agrees with this.  Will refer to Dr. Rexene Edison.

## 2013-11-29 NOTE — Progress Notes (Signed)
Pre-visit discussion using our clinic review tool. No additional management support is needed unless otherwise documented below in the visit note.  

## 2013-11-29 NOTE — Progress Notes (Signed)
Subjective:    Patient ID: Emily Moon, female    DOB: 1961-10-25, 53 y.o.   MRN: [id number]  HPI  52 yo female well known to me here for follow up.  While I was out on maternity leave, Dr. Lorelei Pont has been managing her OA and chronic pain.  Has received cortisone injections in office and is being referred for Hip injection under fluoro- this has not yet been set up. Also taking Oxycodone 15 mg three times daily as needed for pain which was started while I was out.  Previously, has had issues with narcotic dependence but insists that her sister is managing her pills for her.  She is also following pain contract.  Acute grief- mom died last week.  Was on hospice and she felt she was able to say good by to her.  She is understandably physically and emotionally exhausted.  Her mom was her best friend.  She feels numb right now.  Was not there when she died because her son was being deployed to the [place]. She is eating ok.  Sleeping ok.  Very tearful and "Just sad." No SI or HI.  Patient Active Problem List   Diagnosis Date Noted  . Grief 11/29/2013  . 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 Insomnia 09/01/2011  . Osteoarthritis of hip 08/05/2011  . Obesity 05/04/2011  . Perimenopausal vasomotor symptoms 02/05/2011  . HYPERLIPIDEMIA 01/20/2011  . SMOKER 01/20/2011  . Chronic pain syndrome 01/07/2011  . RENAL CALCULUS, RECURRENT 11/13/2010  . OTHER AND UNSPECIFIED BIPOLAR DISORDERS 08/28/2010  . DEPRESSION 08/28/2010  . MULTIPLE SCLEROSIS, PROGRESSIVE/RELAPSING 08/28/2010  . GERD 08/28/2010  . OSTEOARTHRITIS 08/28/2010  . NEPHROLITHIASIS, HX OF 08/28/2010   Past Medical History  Diagnosis Date  . Bipolar disorder   . Multiple sclerosis   . Depression   . GERD (gastroesophageal reflux disease)   . Nephrolithiasis   . Osteoporosis     osteoarthritis  . Fibromyalgia syndrome   . Hip dysplasia, congenital  09/15/2013   Past Surgical History  Procedure Laterality Date  . Tonsillectomy    . Joint replacement Left 2013    hip replacement   History  Substance Use Topics  . Smoking status: Current Some Day Smoker -- 0.50 packs/day for 29 years    Types: Cigarettes  . Smokeless tobacco: Never Used  . Alcohol Use: No   Family History  Problem Relation Age of Onset  . Cancer Father    Allergies  Allergen Reactions  . Tylenol [Acetaminophen] Shortness Of Breath and Swelling  . Ultram [Tramadol Hcl]   . Aripiprazole   . Cefaclor   . Diclofenac Sodium   . Ibuprofen   . Naproxen Sodium    Current Outpatient Prescriptions on File Prior to Visit  Medication Sig Dispense Refill  . aspirin 325 MG tablet Take 325 mg by mouth as needed.      Marland Kitchen BIOTIN PO Take 600 mg by mouth daily.      . calcium carbonate (TUMS - DOSED IN MG ELEMENTAL CALCIUM) 500 MG chewable tablet Chew 3 tablets by mouth daily.      . cetirizine (ZYRTEC) 10 MG tablet Take 10 mg by mouth daily.      . Cholecalciferol (VITAMIN D-3 PO) Take 6,000 mg by mouth daily.      . diclofenac sodium (VOLTAREN) 1 % GEL Apply 4 g topically 4 (four) times daily.  500 g  5  .  DULoxetine (CYMBALTA) 60 MG capsule Take 1 capsule (60 mg total) by mouth daily.  30 capsule  3  . hydrOXYzine (ATARAX/VISTARIL) 25 MG tablet Take 2 by mouth at bedtime  60 tablet  6  . lamoTRIgine (LAMICTAL) 150 MG tablet Take two tablets by mouth daily  180 tablet  3  . levalbuterol (XOPENEX HFA) 45 MCG/ACT inhaler Inhale 1-2 puffs into the lungs every 4 (four) hours as needed.  1 Inhaler  0  . Lysine 1000 MG TABS Take 1,000 mg by mouth daily.      . methylphenidate (RITALIN) 10 MG tablet Take 10 mg by mouth as needed.      . temazepam (RESTORIL) 30 MG capsule TAKE ONE TO TWO CAPSULES BY MOUTH NIGHTLY AT BEDTIME  60 capsule  0  . tiZANidine (ZANAFLEX) 4 MG tablet Take 1 tablet by mouth every 6 hours as needed  90 tablet  0  . mirabegron ER (MYRBETRIQ) 25 MG TB24  tablet Take 25 mg by mouth daily.      . silodosin (RAPAFLO) 4 MG CAPS capsule Take 4 mg by mouth daily with breakfast.       No current facility-administered medications on file prior to visit.   The PMH, PSH, Social History, Family History, Medications, and allergies have been reviewed in [facility], and have been updated if relevant.    Review of Systems    See HPI No CP or SOB Objective:   Physical Exam BP 118/72  Pulse 88  Temp(Src) 98.6 F (37 C) (Oral)  Wt 213 lb 8 oz (96.843 kg)  SpO2 98%  LMP 11/06/2013 Gen: alert, pleasant, difficulty focusing one topic  Pscyh:  Tearful but appropriate     Assessment & Plan:

## 2013-12-04 ENCOUNTER — Ambulatory Visit
Admission: RE | Admit: 2013-12-04 | Discharge: 2013-12-04 | Disposition: A | Discharge status: Home / Self Care | Payer: BC Managed Care – PPO | Source: Ambulatory Visit | Attending: Family Medicine | Admitting: Family Medicine

## 2013-12-04 ENCOUNTER — Ambulatory Visit: Payer: BC Managed Care – PPO | Admitting: Family Medicine

## 2013-12-04 DIAGNOSIS — M1631 Unilateral osteoarthritis resulting from hip dysplasia, right hip: Secondary | ICD-10-CM

## 2013-12-04 MED ORDER — METHYLPREDNISOLONE ACETATE 40 MG/ML INJ SUSP (RADIOLOG
120.0000 mg | Freq: Once | INTRAMUSCULAR | Status: AC
  Administered 2013-12-04: 120 mg via INTRA_ARTICULAR

## 2013-12-04 MED ORDER — IOHEXOL 180 MG/ML  SOLN
1.0000 mL | Freq: Once | INTRAMUSCULAR | Status: AC | PRN
  Administered 2013-12-04: 1 mL via INTRA_ARTICULAR

## 2013-12-14 ENCOUNTER — Ambulatory Visit: Payer: BC Managed Care – PPO | Admitting: Psychology

## 2013-12-21 ENCOUNTER — Other Ambulatory Visit: Payer: Self-pay | Admitting: Internal Medicine

## 2013-12-21 NOTE — Telephone Encounter (Signed)
Lm on pts vm informing her Rx called in to requested pharmacy 

## 2013-12-21 NOTE — Telephone Encounter (Signed)
Last filled 11/06/13--please advise

## 2013-12-27 ENCOUNTER — Ambulatory Visit (INDEPENDENT_AMBULATORY_CARE_PROVIDER_SITE_OTHER)
Admission: RE | Admit: 2013-12-27 | Discharge: 2013-12-27 | Disposition: A | Discharge status: Home / Self Care | Payer: BC Managed Care – PPO | Source: Ambulatory Visit | Attending: Family Medicine | Admitting: Family Medicine

## 2013-12-27 ENCOUNTER — Encounter: Payer: Self-pay | Admitting: Family Medicine

## 2013-12-27 ENCOUNTER — Ambulatory Visit (INDEPENDENT_AMBULATORY_CARE_PROVIDER_SITE_OTHER): Payer: BC Managed Care – PPO | Admitting: Family Medicine

## 2013-12-27 VITALS — BP 166/82 | HR 105 | Temp 99.3°F | Wt 203.2 lb

## 2013-12-27 DIAGNOSIS — F4321 Adjustment disorder with depressed mood: Secondary | ICD-10-CM

## 2013-12-27 DIAGNOSIS — M21961 Unspecified acquired deformity of right lower leg: Secondary | ICD-10-CM | POA: Insufficient documentation

## 2013-12-27 DIAGNOSIS — S6990XA Unspecified injury of unspecified wrist, hand and finger(s), initial encounter: Secondary | ICD-10-CM

## 2013-12-27 DIAGNOSIS — S6991XA Unspecified injury of right wrist, hand and finger(s), initial encounter: Secondary | ICD-10-CM

## 2013-12-27 DIAGNOSIS — M21969 Unspecified acquired deformity of unspecified lower leg: Secondary | ICD-10-CM

## 2013-12-27 IMAGING — CR DG HAND COMPLETE 3+V*R*
3 series · 3 of 3 positions shown · non-contrast
Comparison: DG WRIST COMPLETE*R* dated [DATE]

CLINICAL DATA: Right hand injury.

EXAM:
RIGHT HAND - COMPLETE 3+ VIEW

[view not recorded (1 of 3)]
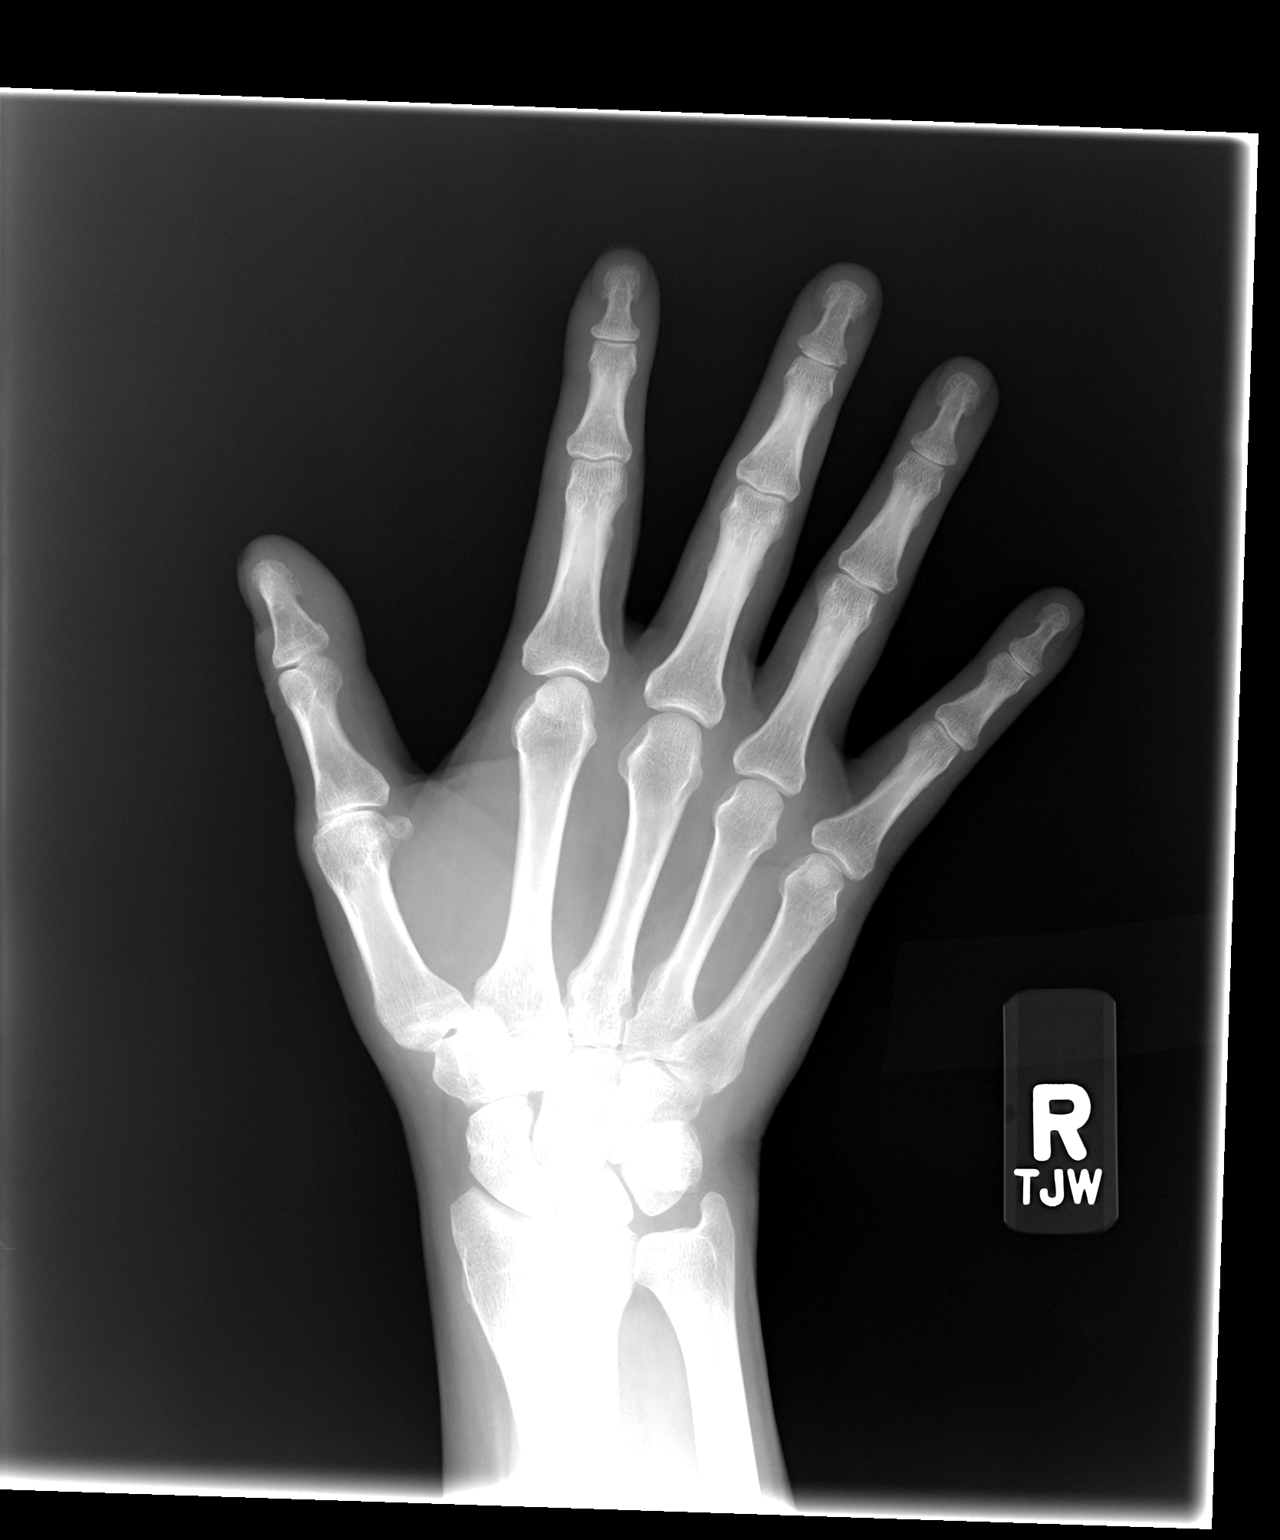

[view not recorded (2 of 3)]
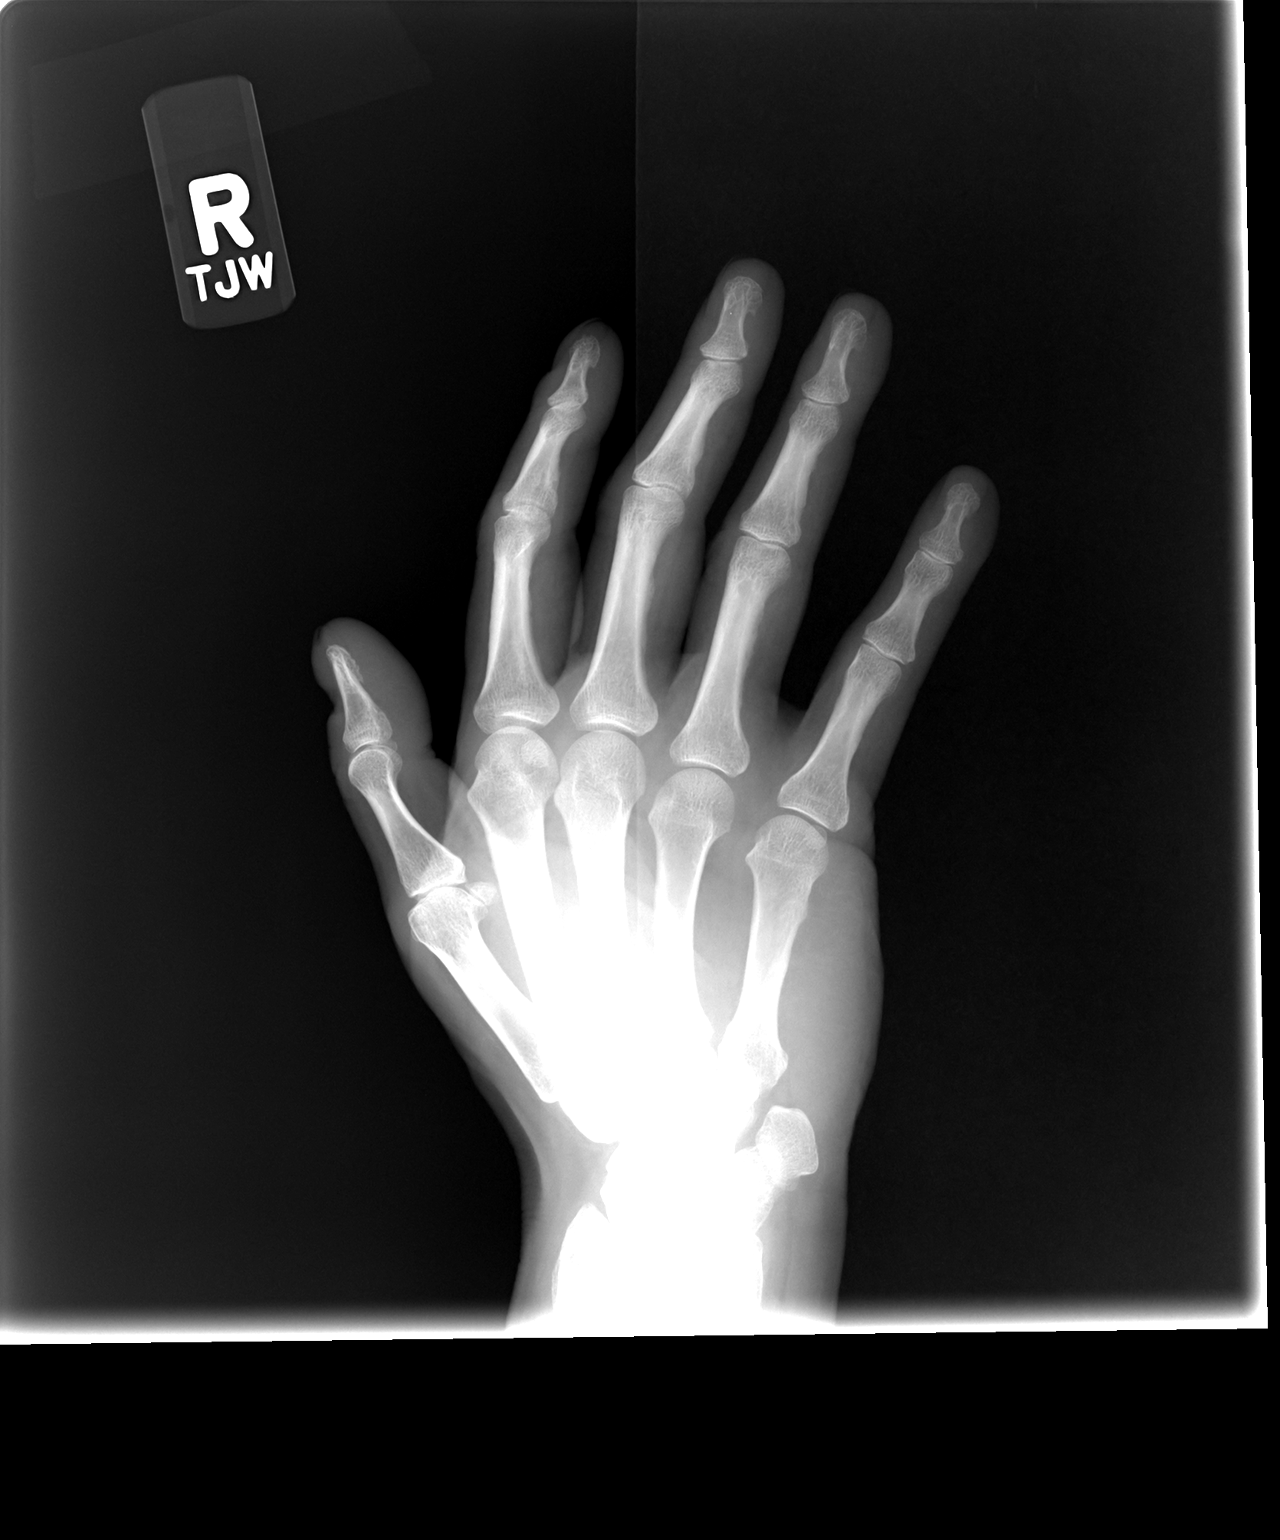

[view not recorded (3 of 3)]
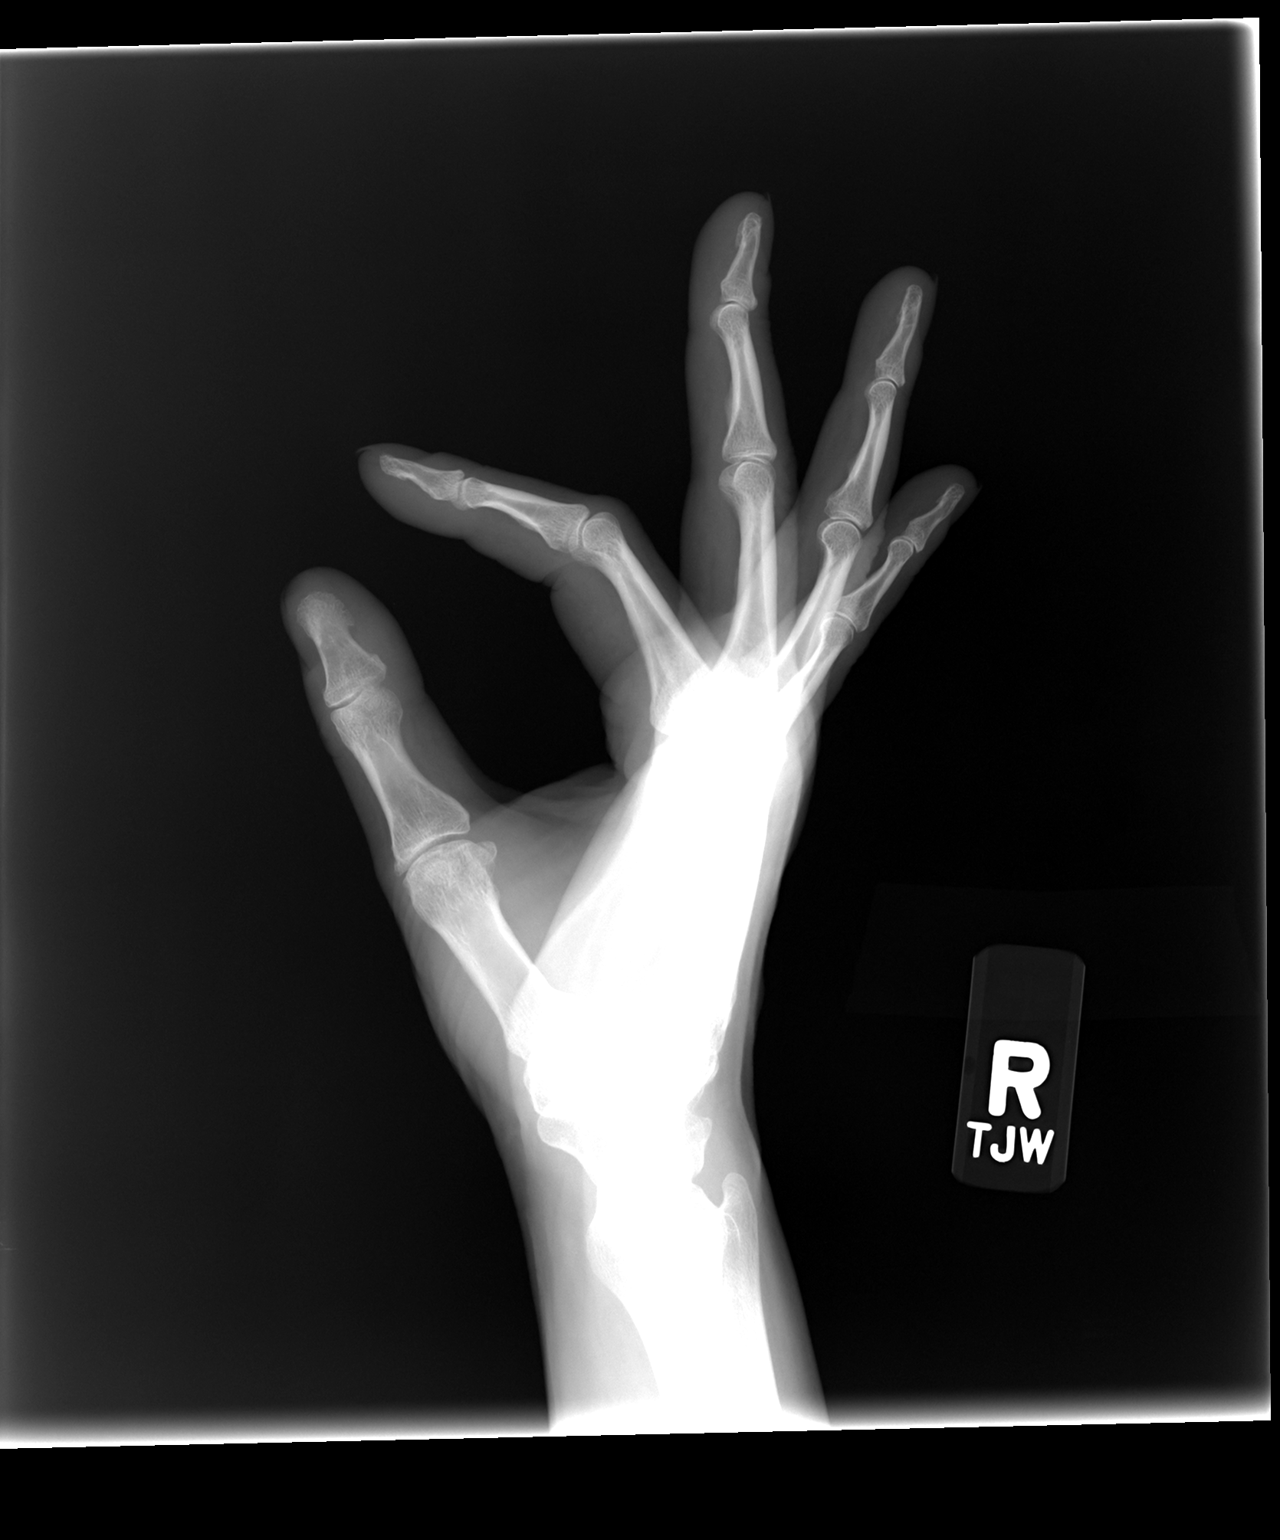

[3 of 3 positions shown; findings below may reference images not displayed]

FINDINGS: Chronic loss of articular space at the first metacarpophalangeal
joint.

No fracture, dislocation, or acute bony findings.
IMPRESSION: 1. No acute bony findings.
2. Degenerative loss of articular space at the first
metacarpophalangeal joint.

## 2013-12-27 MED ORDER — OXYCODONE HCL 15 MG PO TABS: 15.0000 mg | ORAL_TABLET | Freq: Three times a day (TID) | ORAL | Status: DC | PRN

## 2013-12-27 NOTE — Progress Notes (Signed)
Pre-visit discussion using our clinic review tool. No additional management support is needed unless otherwise documented below in the visit note.  

## 2013-12-27 NOTE — Assessment & Plan Note (Signed)
Refer to ortho per pt request  

## 2013-12-27 NOTE — Progress Notes (Signed)
Subjective:    Patient ID: Emily Moon, female    DOB: 05/08/1961, 53 y.o.   MRN: [id number]  Hand Pain   Neck Pain     53 yo female well known to me here for follow up.   Acute grief- mom died last month.  She feels she is handling this the best way she can.  She is seeing a therapist weekly through hospice who comes to her house.  She feels this is helping.    Right hand injury.  Was on toilet and leg fell asleep two nights ago, stood up and leg gave out on her and she fell onto her right hand into the sink.  Since then her right hand has been swollen and painful.  Right foot deformity- per pt, podiatry told her she needs surgical repair.  May need to have "bone shaved down." No LE weakness.  Patient Active Problem List   Diagnosis Date Noted  . Injury of right hand 12/27/2013  . 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 Grief 11/29/2013  . 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 Insomnia 09/01/2011  . Osteoarthritis of hip 08/05/2011  . Obesity 05/04/2011  . Perimenopausal vasomotor symptoms 02/05/2011  . HYPERLIPIDEMIA 01/20/2011  . SMOKER 01/20/2011  . Chronic pain syndrome 01/07/2011  . RENAL CALCULUS, RECURRENT 11/13/2010  . OTHER AND UNSPECIFIED BIPOLAR DISORDERS 08/28/2010  . DEPRESSION 08/28/2010  . MULTIPLE SCLEROSIS, PROGRESSIVE/RELAPSING 08/28/2010  . GERD 08/28/2010  . OSTEOARTHRITIS 08/28/2010  . NEPHROLITHIASIS, HX OF 08/28/2010   Past Medical History  Diagnosis Date  . Bipolar disorder   . Multiple sclerosis   . Depression   . GERD (gastroesophageal reflux disease)   . Nephrolithiasis   . Osteoporosis     osteoarthritis  . Fibromyalgia syndrome   . Hip dysplasia, 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   Past Surgical History  Procedure Laterality Date  . Tonsillectomy    . Joint replacement Left 2013    hip replacement   History  Substance Use Topics  . Smoking status: Current Some Day  Smoker -- 0.50 packs/day for 29 years    Types: Cigarettes  . Smokeless tobacco: Never Used  . Alcohol Use: No   Family History  Problem Relation Age of Onset  . Cancer Father    Allergies  Allergen Reactions  . Tylenol [Acetaminophen] Shortness Of Breath and Swelling  . Ultram [Tramadol Hcl]   . Aripiprazole   . Cefaclor   . Diclofenac Sodium   . Ibuprofen   . Naproxen Sodium    Current Outpatient Prescriptions on File Prior to Visit  Medication Sig Dispense Refill  . aspirin 325 MG tablet Take 325 mg by mouth as needed.      Marland Kitchen BIOTIN PO Take 600 mg by mouth daily.      . calcium carbonate (TUMS - DOSED IN MG ELEMENTAL CALCIUM) 500 MG chewable tablet Chew 3 tablets by mouth daily.      . cetirizine (ZYRTEC) 10 MG tablet Take 10 mg by mouth daily.      . Cholecalciferol (VITAMIN D-3 PO) Take 6,000 mg by mouth daily.      . diclofenac sodium (VOLTAREN) 1 % GEL Apply 4 g topically 4 (four) times daily.  500 g  5  . DULoxetine (CYMBALTA) 60 MG capsule Take 1 capsule (60 mg total) by mouth daily.  30 capsule  3  . hydrOXYzine (ATARAX/VISTARIL) 25 MG tablet Take 2 by mouth at bedtime  60 tablet  6  . lamoTRIgine (LAMICTAL) 150 MG tablet Take two tablets by mouth daily  180 tablet  3  . levalbuterol (XOPENEX HFA) 45 MCG/ACT inhaler Inhale 1-2 puffs into the lungs every 4 (four) hours as needed.  1 Inhaler  0  . Lysine 1000 MG TABS Take 1,000 mg by mouth daily.      . methylphenidate (RITALIN) 10 MG tablet Take 10 mg by mouth as needed.      . mirabegron ER (MYRBETRIQ) 25 MG TB24 tablet Take 25 mg by mouth daily.      . silodosin (RAPAFLO) 4 MG CAPS capsule Take 4 mg by mouth daily with breakfast.      . temazepam (RESTORIL) 30 MG capsule take 1 to 2 capsules by mouth nightly at bedtime as needed  60 capsule  0  . tiZANidine (ZANAFLEX) 4 MG tablet Take 1 tablet by mouth every 6 hours as needed  90 tablet  0   No current facility-administered medications on file prior to visit.   The  PMH, PSH, Social History, Family History, Medications, and allergies have been reviewed in [facility], and have been updated if relevant.    Review of Systems  Musculoskeletal: Negative for gait problem and joint swelling.  Psychiatric/Behavioral: Negative for suicidal ideas, sleep disturbance and self-injury. The patient is not nervous/anxious.       See HPI No CP or SOB Objective:   Physical Exam BP 166/82  Pulse 105  Temp(Src) 99.3 F (37.4 C) (Oral)  Wt 203 lb 4 oz (92.194 kg)  SpO2 98%  LMP 12/04/2013 Gen: alert, pleasant, difficulty focusing one topic MSK: Right hand- FROM- she is tender to palpation over base of 2nd and 3rd metacarpal Normal grip strength Right foot- large bunion, curvature of right great toe (mass effect) Pscyh:  Tearful but appropriate     Assessment & Plan:

## 2013-12-27 NOTE — Patient Instructions (Signed)
Great to see you. I will call you with your xray results.  We will call you with a surgery referral.

## 2013-12-27 NOTE — Assessment & Plan Note (Signed)
Xray today to rule out fx.

## 2013-12-27 NOTE — Assessment & Plan Note (Signed)
Continue working with hospice therapist. Response seems very appropriate.

## 2013-12-28 ENCOUNTER — Ambulatory Visit: Payer: BC Managed Care – PPO | Admitting: Family Medicine

## 2013-12-29 ENCOUNTER — Other Ambulatory Visit: Payer: Self-pay | Admitting: Family Medicine

## 2014-01-11 ENCOUNTER — Ambulatory Visit: Payer: BC Managed Care – PPO | Admitting: Family Medicine

## 2014-01-17 ENCOUNTER — Other Ambulatory Visit: Payer: Self-pay | Admitting: Family Medicine

## 2014-01-17 NOTE — Telephone Encounter (Signed)
Pt requesting medication refill. Last ov 12/2013 with f/u appt 01/2014. pls advise

## 2014-01-18 ENCOUNTER — Encounter: Payer: Self-pay | Admitting: Podiatry

## 2014-01-18 ENCOUNTER — Ambulatory Visit (INDEPENDENT_AMBULATORY_CARE_PROVIDER_SITE_OTHER): Payer: BC Managed Care – PPO

## 2014-01-18 ENCOUNTER — Ambulatory Visit (INDEPENDENT_AMBULATORY_CARE_PROVIDER_SITE_OTHER): Payer: BC Managed Care – PPO | Admitting: Podiatry

## 2014-01-18 VITALS — BP 116/82 | HR 100 | Resp 16 | Ht 69.0 in | Wt 200.4 lb

## 2014-01-18 DIAGNOSIS — M2010 Hallux valgus (acquired), unspecified foot: Secondary | ICD-10-CM

## 2014-01-18 DIAGNOSIS — M21619 Bunion of unspecified foot: Secondary | ICD-10-CM

## 2014-01-18 DIAGNOSIS — M79673 Pain in unspecified foot: Secondary | ICD-10-CM

## 2014-01-18 DIAGNOSIS — M79609 Pain in unspecified limb: Secondary | ICD-10-CM

## 2014-01-18 NOTE — Progress Notes (Signed)
   Subjective:    Patient ID: Emily Moon, female    DOB: 06-03-1961, 53 y.o.   MRN: [id number]  HPI Comments: Its both of my feet. i have bunions on both. The right one is getting bigger. Ive had it on the right foot since the 90's. My toes cross over. i have these places on the bottom. i dont do anything for my feet. i have ortho heels. i have bad foot odor.   Foot Pain Associated symptoms include fatigue, numbness and weakness.      Review of Systems  Constitutional: Positive for fatigue.  HENT: Positive for trouble swallowing.   Eyes: Positive for visual disturbance.  Respiratory: Negative.   Cardiovascular: Negative.   Gastrointestinal:       Bloating Abdominal pain Constipation Diarrhea   Endocrine:       Increase unrination  Genitourinary: Positive for frequency.       Difficulty urination urgerncy   Musculoskeletal: Positive for back pain.       Joint pain Difficulty walking Muscle pain  Skin:       Change in nails  Allergic/Immunologic: Positive for environmental allergies.  Neurological: Positive for tremors, weakness and numbness.  Hematological: Negative.   Psychiatric/Behavioral: Positive for behavioral problems.       Objective:   Physical Exam: I have reviewed her past medical history medications allergies surgeries and social history. At this point pulses are strongly palpable bilateral lower extremity. Neurologic sensorium is intact per Semmes-Weinstein monofilament is slightly diminished it appears. Her deep tendon reflexes are in non-elicitable. Muscle strength is 5 over 5 dorsiflexors plantar flexors inverters everters all intrinsic musculature is intact. 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 Mild deformity left foot. She has pain on range of motion of the first metatarsophalangeal joint. She also has limitation on range of motion of this joint  indicative of hallux limitus. Taylor's bunion deformity is prominent and painful on palpation. Radiographic evaluation does demonstrate lateral deviation of the fifth metatarsal of the right foot indicative of Taylor's bunion deformity and an increase in the first intermetatarsal angle of the right foot indicative of a hallux abductovalgus deformity. It also demonstrates dislocation of the first metatarsophalangeal joint.        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  Plan: Discussed etiology pathology conservative versus surgical therapies. At this point she would like to have surgical intervention consisting of an [facility] bunion repair and a fifth metatarsal osteotomy with screws. I answered all the questions regarding these procedures to the best of my ability in layman's terms she understood it was amenable to it and signed all 3 pages of the consent form. We also discussed the possible complications which may include but are not limited to postop pain bleeding swelling infection need for further surgery loss of digit loss of limb loss of life. She was dispensed a Cam Walker today and I will followup with her in the near future for surgical intervention.

## 2014-01-18 NOTE — Telephone Encounter (Signed)
Lm on pts vm informing her Rx has been called in to requested pharmacy 

## 2014-01-22 ENCOUNTER — Telehealth: Payer: Self-pay | Admitting: *Deleted

## 2014-01-22 ENCOUNTER — Ambulatory Visit (INDEPENDENT_AMBULATORY_CARE_PROVIDER_SITE_OTHER): Payer: BC Managed Care – PPO | Admitting: Family Medicine

## 2014-01-22 ENCOUNTER — Telehealth: Payer: Self-pay | Admitting: Family Medicine

## 2014-01-22 ENCOUNTER — Encounter: Payer: Self-pay | Admitting: Family Medicine

## 2014-01-22 VITALS — BP 128/76 | HR 111 | Temp 99.2°F | Wt 205.5 lb

## 2014-01-22 DIAGNOSIS — B351 Tinea unguium: Secondary | ICD-10-CM

## 2014-01-22 DIAGNOSIS — Z01818 Encounter for other preprocedural examination: Secondary | ICD-10-CM | POA: Insufficient documentation

## 2014-01-22 DIAGNOSIS — G35 Multiple sclerosis: Secondary | ICD-10-CM

## 2014-01-22 MED ORDER — OXYCODONE HCL 15 MG PO TABS: 15.0000 mg | ORAL_TABLET | Freq: Three times a day (TID) | ORAL | Status: DC | PRN

## 2014-01-22 MED ORDER — CICLOPIROX 8 % EX SOLN: Freq: Every day | CUTANEOUS | Status: DC

## 2014-01-22 NOTE — Telephone Encounter (Signed)
Relevant patient education assigned to patient using Emmi. ° °

## 2014-01-22 NOTE — Assessment & Plan Note (Signed)
Likely needs oral lamisil a this point, but will try topical ciclopirox as she would like to try a topical agent first. Call or return to clinic prn if these symptoms worsen or fail to improve as anticipated. The patient indicates understanding of these issues and agrees with the plan.

## 2014-01-22 NOTE — Assessment & Plan Note (Signed)
Deteriorated and she would like for me to manage this.  Explained to her that we will need to refer her to neurology as this is outside my scope of practice.  She prefers [facility].  Will refer to [facility].

## 2014-01-22 NOTE — Progress Notes (Signed)
Subjective:   Patient ID: Gorden Harms, female    DOB: [birth year], 53 y.o.   MRN: [id number]  Mirielle Byrum is a pleasant 53 y.o. year old female who presents to clinic today with surgical clearance and Multiple Sclerosis  on 01/22/2014  HPI: Seeing Dr. Milinda Pointer- surgery scheduled in a couple of for bunion repair and 5th metatarsal osteotomy with screws.  Has had multiple surgeries in past without difficulty.  No issues with anesthesia or post operative complications.  Has no objection to receiving blood products if needed.  Feels MS is getting worse- followed by [facility] neurology.  Increased constipation and balance issues. Wants to see neurologist in [facility].  Finger nail fungus-has tried OTC preparations for years without improvement.  Patient Active Problem List   Diagnosis Date Noted  . Nail fungus 01/22/2014  . Injury of right hand 12/27/2013  . 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 Grief 11/29/2013  . 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 Insomnia 09/01/2011  . Osteoarthritis of hip 08/05/2011  . Obesity 05/04/2011  . Perimenopausal vasomotor symptoms 02/05/2011  . HYPERLIPIDEMIA 01/20/2011  . SMOKER 01/20/2011  . Chronic pain syndrome 01/07/2011  . RENAL CALCULUS, RECURRENT 11/13/2010  . OTHER AND UNSPECIFIED BIPOLAR DISORDERS 08/28/2010  . DEPRESSION 08/28/2010  . MULTIPLE SCLEROSIS, PROGRESSIVE/RELAPSING 08/28/2010  . GERD 08/28/2010  . OSTEOARTHRITIS 08/28/2010  . NEPHROLITHIASIS, HX OF 08/28/2010   Past Medical History  Diagnosis Date  . Bipolar disorder   . Multiple sclerosis   . Depression   . GERD (gastroesophageal reflux disease)   . Nephrolithiasis   . Osteoporosis     osteoarthritis  . Fibromyalgia syndrome   . Hip dysplasia, 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   Past Surgical History  Procedure Laterality Date  . Tonsillectomy    . Joint replacement Left 2013    hip replacement    History  Substance Use Topics  . Smoking status: Current Some Day Smoker -- 0.50 packs/day for 29 years    Types: Cigarettes  . Smokeless tobacco: Never Used  . Alcohol Use: No   Family History  Problem Relation Age of Onset  . Cancer Father    Allergies  Allergen Reactions  . Tylenol [Acetaminophen] Shortness Of Breath and Swelling  . Ultram [Tramadol Hcl]   . Aripiprazole   . Cefaclor   . Diclofenac Sodium   . Ibuprofen   . Naproxen Sodium    Current Outpatient Prescriptions on File Prior to Visit  Medication Sig Dispense Refill  . aspirin 325 MG tablet Take 325 mg by mouth as needed.      Marland Kitchen BIOTIN PO Take 600 mg by mouth daily.      . calcium carbonate (TUMS - DOSED IN MG ELEMENTAL CALCIUM) 500 MG chewable tablet Chew 3 tablets by mouth daily.      . cetirizine (ZYRTEC) 10 MG tablet Take 10 mg by mouth daily.      . Cholecalciferol (VITAMIN D-3 PO) Take 6,000 mg by mouth daily.      . diclofenac sodium (VOLTAREN) 1 % GEL Apply 4 g topically 4 (four) times daily.  500 g  5  . DULoxetine (CYMBALTA) 60 MG capsule TAKE ONE CAPSULE BY MOUTH ONE TIME DAILY   30 capsule  2  . hydrOXYzine (ATARAX/VISTARIL) 25 MG tablet TAKE TWO TABLETS BY MOUTH NIGHTLY AT BEDTIME   60 tablet  5  . lamoTRIgine (LAMICTAL) 150 MG tablet Take two tablets by mouth daily  180 tablet  3  . levalbuterol (XOPENEX HFA) 45 MCG/ACT inhaler Inhale 1-2 puffs into the lungs every 4 (four) hours as needed.  1 Inhaler  0  . Lysine 1000 MG TABS Take 1,000 mg by mouth daily.      . methylphenidate (RITALIN) 10 MG tablet Take 10 mg by mouth as needed.      . mirabegron ER (MYRBETRIQ) 25 MG TB24 tablet Take 25 mg by mouth daily.      . silodosin (RAPAFLO) 4 MG CAPS capsule Take 4 mg by mouth daily with breakfast.      . temazepam (RESTORIL) 30 MG capsule TAKE ONE TO TWO CAPSULES BY MOUTH NIGHTLY AT BEDTIME AS NEEDED   60 capsule  0  . tiZANidine (ZANAFLEX) 4 MG tablet Take 1 tablet by mouth every 6 hours as needed   90 tablet  0   No current facility-administered medications on file prior to visit.   The PMH, PSH, Social History, Family History, Medications, and allergies have been reviewed in [facility], and have been updated if relevant.   Review of Systems See HPI No CP No SOB No blurred vision    Objective:    BP 128/76  Pulse 111  Temp(Src) 99.2 F (37.3 C) (Oral)  Wt 205 lb 8 oz (93.214 kg)  SpO2 97%  LMP 12/30/2013   Physical Exam  Nursing note and vitals reviewed. Constitutional: She appears well-developed and well-nourished. No distress.  HENT:  Head: Normocephalic.  Eyes: Pupils are equal, round, and reactive to light.  Cardiovascular: Regular rhythm.   Pulmonary/Chest: Effort normal and breath sounds normal.  Skin: Skin is warm and dry. No rash noted. No cyanosis. Nails show no clubbing.             Assessment & Plan:   Preoperative clearance - Plan: EKG 12-Lead  MULTIPLE SCLEROSIS, PROGRESSIVE/RELAPSING - Plan: Ambulatory referral to Neurology  Nail fungus No Follow-up on file.

## 2014-01-22 NOTE — Progress Notes (Signed)
Pre visit review using our clinic review tool, if applicable. No additional management support is needed unless otherwise documented below in the visit note. 

## 2014-01-22 NOTE — Assessment & Plan Note (Signed)
Mildly tachycardic- likely because she is very rushed today to tell me "a whole list of things." EKG also showed some atrial enlargement- likely chronic. Likely low risk for surgery.   Will forward EKG results and this note to Dr. Milinda Pointer.

## 2014-01-22 NOTE — Telephone Encounter (Signed)
Message copied by Modena Nunnery on Mon Jan 22, 2014  4:59 PM ------      Message from: Lucille Passy      Created: Mon Jan 22, 2014 12:49 PM       Please fax my note from today along with EKG to Dr. Ardine Eng,      Tanja Port ------

## 2014-01-22 NOTE — Telephone Encounter (Signed)
Last ov note and ekg faxed to Dr Milinda Pointer at [facility] as requested

## 2014-01-22 NOTE — Patient Instructions (Signed)
Good to see you. We will fax these results to Dr. Milinda Pointer.  Let me know how your nails look in a few weeks.  We will call you with a neurology referral.

## 2014-01-23 ENCOUNTER — Telehealth: Payer: Self-pay

## 2014-01-23 NOTE — Telephone Encounter (Signed)
Pt left v/m; pt was seen at [facility]; pt said Dr Deborra Medina sent EKG but [facility] request more info for medical clearance; [facility] fax # [phone].

## 2014-01-24 ENCOUNTER — Encounter: Payer: Self-pay | Admitting: Neurology

## 2014-01-24 ENCOUNTER — Ambulatory Visit (INDEPENDENT_AMBULATORY_CARE_PROVIDER_SITE_OTHER): Payer: BC Managed Care – PPO | Admitting: Neurology

## 2014-01-24 VITALS — BP 130/90 | HR 68 | Temp 98.9°F | Resp 20 | Ht 69.0 in | Wt 204.9 lb

## 2014-01-24 DIAGNOSIS — G35 Multiple sclerosis: Secondary | ICD-10-CM

## 2014-01-24 NOTE — Telephone Encounter (Signed)
Information re-faxed to [facility] as requested.

## 2014-01-24 NOTE — Patient Instructions (Signed)
We will definitely need another baseline MRI.  Call us when you are ready to have this done.  When you go for the MRI, take a CD of your last MRI to give to the radiologist so he can compare.  In the meantime, do yoga and exercise.  Eat a Mediterranean diet.  Spend about 15 minutes a day in the sun.  Continue the vitamin D.  Also, I really think seeing a psychiatrist and/or therapist would be beneficial.  Call with questions or concerns.  I want to see you after the MRI is performed to discuss further management.

## 2014-01-24 NOTE — Telephone Encounter (Signed)
Please fax my note as well from that appointment.

## 2014-01-24 NOTE — Progress Notes (Addendum)
NEUROLOGY CONSULTATION NOTE  Emily Moon MRN: [id number] DOB: Sep 25, 1961  Referring provider: Dr. Deborra Medina Primary care provider: Dr. Deborra Medina  Reason for consult:  MS  HISTORY OF PRESENT ILLNESS: Emily Moon is a 53 year old right-handed woman with depression preponderant bipolar disorder, insomnia, vitamin D deficiency, OSA who presents to establish care for relapsing-remitting MS.  She was previously followed at [facility].  Records and images were personally reviewed where available.    She was diagnosed in 1991 after experiencing total numbness in the left leg.  Reportedly, imaging revealed a focus in the frontal lobes but that the MRI of the spine was unremarkable.  She did not have a lumbar puncture.  She was told that she had MS.  She was stable until 2010.  She had no follow up visits with neurology or images in the interim.  She saw a neurologist in [place], [place].  MRI of the brain, cervical and thoracic regions revealed multiple white matter lesions, including the periventricular, subcortical and deep white matter.  All non-enhancing.  She was subsequently started on Copaxone but stopped after 4 weeks due to side effects.  Since that time, she was experiencing increased problems with concentration, memory, fatigue, pain, speech, fine motor skills and gait.  Memory:  Short term memory problems.  Primarily, she notes that she will lose her train of thought or forget what she just said even while talking.  She has trouble remembering new names.  She has no problems remembering names of people she knows.  She does not get disoriented while she drives.  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  She reports history of falls.  She is not sure why she has fallen.  She thinks that maybe her feet drag and gets caught on the ground.  She has generalized pain and history of osteoarthritis.  Tramadol was ineffective.  Lidoderm patches  provided minimal relief.  She is unable to tolerate NSAIDs.  Gabapentin caused side effects.  Oxycontin caused side effects.  Percocet caused side effects.  Hydrocodone helps, which she still takes.  She takes Ritalin for fatigue every once in a while.  She had an episode of floaters due to retinal problems  In 2010, she started to see Dr. Felecia Shelling at [facility]. ESR, ANA, ANCA, homocysteine were presumed normal for vasculitis as he did agree with diagnosis of MS.  After Copaxone, it appears that she was never restarted on another agent, apparently due to concerns of side effects.  From the last note by Dr. Felecia Shelling in the chart, from 2012, he suggested Gilenya or teriflunomide.  She never started a disease modifying agent.  Repeat MRI of the brain from 2012 reportedly was unchanged when compared to prior study in 2010.  In 2012 she moved back to [place] but moved back here about a year ago to take care of her ailing mother.  Her mother passed away this past 11-08-23.  She has noticed an increase in her symptoms since then.  She reports some swallowing difficulty, particularly with water.  She also has constipation.  Gait has still been an issue.  She had a left hip replacement in 2013.  She has depression and Bipolar disorder but has not established care with a psychiatrist because of cost.  She still does not want to start a disease modifying agent.  She would rather not repeat MRI because of cost.  She is interested in more  holistic medicine.  PAST MEDICAL HISTORY: Past Medical History  Diagnosis Date  . Bipolar disorder   . Multiple sclerosis   . Depression   . GERD (gastroesophageal reflux disease)   . Nephrolithiasis   . Osteoporosis     osteoarthritis  . Fibromyalgia syndrome   . Hip dysplasia, 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    PAST SURGICAL HISTORY: Past Surgical History  Procedure Laterality Date  . Tonsillectomy    . Joint replacement Left 2013    hip replacement    MEDICATIONS: Current  Outpatient Prescriptions on File Prior to Visit  Medication Sig Dispense Refill  . aspirin 325 MG tablet Take 325 mg by mouth as needed.      Marland Kitchen BIOTIN PO Take 600 mg by mouth daily.      . calcium carbonate (TUMS - DOSED IN MG ELEMENTAL CALCIUM) 500 MG chewable tablet Chew 3 tablets by mouth daily.      . cetirizine (ZYRTEC) 10 MG tablet Take 10 mg by mouth daily.      . Cholecalciferol (VITAMIN D-3 PO) Take 6,000 mg by mouth daily.      . ciclopirox (PENLAC) 8 % solution Apply topically at bedtime. Apply over nail and surrounding skin. Apply daily over previous coat. After seven (7) days, may remove with alcohol and continue cycle.  6.6 mL  0  . diclofenac sodium (VOLTAREN) 1 % GEL Apply 4 g topically 4 (four) times daily.  500 g  5  . DULoxetine (CYMBALTA) 60 MG capsule TAKE ONE CAPSULE BY MOUTH ONE TIME DAILY   30 capsule  2  . hydrOXYzine (ATARAX/VISTARIL) 25 MG tablet TAKE TWO TABLETS BY MOUTH NIGHTLY AT BEDTIME   60 tablet  5  . lamoTRIgine (LAMICTAL) 150 MG tablet Take two tablets by mouth daily  180 tablet  3  . levalbuterol (XOPENEX HFA) 45 MCG/ACT inhaler Inhale 1-2 puffs into the lungs every 4 (four) hours as needed.  1 Inhaler  0  . Lysine 1000 MG TABS Take 1,000 mg by mouth daily.      . methylphenidate (RITALIN) 10 MG tablet Take 10 mg by mouth as needed.      . mirabegron ER (MYRBETRIQ) 25 MG TB24 tablet Take 25 mg by mouth daily.      Marland Kitchen oxyCODONE (ROXICODONE) 15 MG immediate release tablet Take 1 tablet (15 mg total) by mouth every 8 (eight) hours as needed for pain.  90 tablet  0  . silodosin (RAPAFLO) 4 MG CAPS capsule Take 4 mg by mouth daily with breakfast.      . temazepam (RESTORIL) 30 MG capsule TAKE ONE TO TWO CAPSULES BY MOUTH NIGHTLY AT BEDTIME AS NEEDED   60 capsule  0  . tiZANidine (ZANAFLEX) 4 MG tablet Take 1 tablet by mouth every 6 hours as needed  90 tablet  0   No current facility-administered medications on file prior to visit.    ALLERGIES: Allergies    Allergen Reactions  . Tylenol [Acetaminophen] Shortness Of Breath and Swelling  . Ultram [Tramadol Hcl]   . Aripiprazole   . Cefaclor   . Diclofenac Sodium   . Ibuprofen   . Naproxen Sodium     FAMILY HISTORY: Family History  Problem Relation Age of Onset  . Cancer Father     SOCIAL HISTORY: History   Social History  . Marital Status: Divorced    Spouse Name: N/A    Number of Children: 1  . Years of Education: N/A   Occupational History  .  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 Smoking status: Current Every Day Smoker -- 0.50 packs/day for 29 years    Types: Cigarettes  . Smokeless tobacco: Never Used     Comment: given   . Alcohol Use: No  . Drug Use: No  . Sexual Activity: No   Other Topics Concern  . Not on file   Social History Narrative  . No narrative on file    REVIEW OF SYSTEMS: Constitutional: No fevers, chills, or sweats, no generalized fatigue, change in appetite Eyes: No visual changes, double vision, eye pain Ear, nose and throat: problems swallowing Cardiovascular: No chest pain, palpitations Respiratory:  No shortness of breath at rest or with exertion, wheezes GastrointestinaI: Constipation. Genitourinary:  No dysuria, urinary retention or frequency Musculoskeletal:  No neck pain, back pain Integumentary: No rash, pruritus, skin lesions Neurological: as above Psychiatric: Depression, insomnia Endocrine: No palpitations, fatigue, diaphoresis, mood swings, change in appetite, change in weight, increased thirst Hematologic/Lymphatic:  No anemia, purpura, petechiae. Allergic/Immunologic: no itchy/runny eyes, nasal congestion, recent allergic reactions, rashes  PHYSICAL EXAM: Filed Vitals:   01/24/14 1435  BP: 130/90  Pulse: 68  Temp: 98.9 F (37.2 C)  Resp: 20   General: No acute distress Head:  Normocephalic/atraumatic Neck: supple, no paraspinal tenderness, full range of motion Back: No  paraspinal tenderness Heart: regular rate and rhythm Lungs: Clear to auscultation bilaterally. Vascular: No carotid bruits. Neurological Exam: Mental status: alert and oriented to person, place, and time, speech fluent and not dysarthric, language intact.  Naming fluency intact.  Had some difficulty and hesitancy completing the Trail Making test, but ultimately completed it correctly.  Able to copy a cube and draw a clock.  Some difficulty with attention.  Abstraction intact.  Recalled 4 of 5 words after 5 minutes.  MOCA 27/30. Cranial nerves: CN I: not tested CN II: pupils equal, round and reactive to light, visual fields intact, fundi unremarkable. CN III, IV, VI:  full range of motion, no nystagmus, no ptosis CN V: facial sensation intact CN VII: upper and lower face symmetric CN VIII: hearing intact CN IX, X: gag intact, uvula midline CN XI: sternocleidomastoid and trapezius muscles intact CN XII: tongue midline Bulk & Tone: normal, no fasciculations. Motor: 5/5 throughout Sensation: Reduced vibration in toes.  Pinprick intact. Deep Tendon Reflexes: 2+ throughout, toes down Finger to nose testing: no dysmetria Heel to shin: no dysmetria Gait: normal stance and stride.  Able to walk on toes, and heels.  Walks in tandem but with some difficulty. Romberg with sway.  IMPRESSION: Multiple sclerosis.  Difficult to categorize type.    PLAN: 1.  Discussed need to repeat MRI for a new baseline.  Due to cost, she does not want to pursue this at this time.  I instructed her to call us when she is ready to have the MRI performed.  When she goes for the MRI, she is to take a CD of her last MRI for the radiology to compare. 2.  We discussed some naturalistic treatment, such as yoga, exercise, and healthy diet. 3.  Continue vit D and spend at least 15 minutes daily in the sun 4.  Follow up after MRI and we can discuss further management.  60 minutes spent with patient, over 50% spent counseling  and coordinating care.  Thank you for allowing me to take part in the care of this patient.  Metta Clines, DO  CC:  Tanja Port  Deborra Medina, MD

## 2014-01-26 ENCOUNTER — Telehealth: Payer: Self-pay

## 2014-01-26 MED ORDER — POLYETHYLENE GLYCOL 3350 17 GM/SCOOP PO POWD: 17.0000 g | Freq: Two times a day (BID) | ORAL | Status: DC | PRN

## 2014-01-26 NOTE — Telephone Encounter (Signed)
Pt left v/m requesting rx for Miralax sent to [facility]; Miralax works well for pt and pt request sent as prescription because less expensive for pt. Pt also wanted to know what to do about digestive issues discussed at 01/22/14 visit. Pt request cb.

## 2014-01-26 NOTE — Telephone Encounter (Signed)
Rx sent . We really should refer her to GI . She is due to have a colonoscopy anyway.  Let me know and I will place referral.

## 2014-01-26 NOTE — Telephone Encounter (Signed)
Spoke to pt and informed her Rx has been sent to requested pharmacy. She states that she is not wanting a referral at this time due to financial reasons.

## 2014-02-08 ENCOUNTER — Other Ambulatory Visit: Payer: Self-pay | Admitting: Podiatry

## 2014-02-08 MED ORDER — PROMETHAZINE HCL 12.5 MG PO TABS: 25.0000 mg | ORAL_TABLET | Freq: Four times a day (QID) | ORAL | Status: DC | PRN

## 2014-02-08 MED ORDER — MEPERIDINE HCL 50 MG PO TABS: ORAL_TABLET | ORAL | Status: DC

## 2014-02-08 MED ORDER — CLINDAMYCIN HCL 150 MG PO CAPS: 150.0000 mg | ORAL_CAPSULE | Freq: Three times a day (TID) | ORAL | Status: DC

## 2014-02-09 ENCOUNTER — Encounter: Payer: Self-pay | Admitting: Podiatry

## 2014-02-09 DIAGNOSIS — M2010 Hallux valgus (acquired), unspecified foot: Secondary | ICD-10-CM

## 2014-02-12 ENCOUNTER — Telehealth: Payer: Self-pay

## 2014-02-12 ENCOUNTER — Telehealth: Payer: Self-pay | Admitting: *Deleted

## 2014-02-12 NOTE — Telephone Encounter (Signed)
Pt had foot surgery on 02/09/14; pt said Dr Milinda Pointer that did surgery called Dr Deborra Medina and reported pt was taking oxycodone 3 tabs three times a day. Pt said she is only taking oxycodone 1 tab three times a day as needed. Pt is upset for the misunderstanding and wants to make sure Dr Deborra Medina is aware she is only taking 1 tab three times a day as needed. Pt has pain med rx from Dr Milinda Pointer and wants Dr Elonda Husky advice if she should get that rx filled or let Dr Deborra Medina fill her next rx for oxycodone. Pt said she would not do anything to damage trust factor between Dr Deborra Medina and herself. Pt request cb.

## 2014-02-12 NOTE — Telephone Encounter (Signed)
I did not receive a call from Dr. Milinda Pointer.  Yes, ok to fill rx from him as long as she goes to the same pharmacy.

## 2014-02-12 NOTE — Telephone Encounter (Signed)
Spoke to pt who states that she did take Rx to same pharmacy as previous prescriptions

## 2014-02-12 NOTE — Telephone Encounter (Signed)
Called and spoke with pt regarding her surgery on 3.27.15. Said she was staying off of foot, elevating taking antibiotic and advil. She stated she is not taking phenergan and is not taking anything for pain cause she does not have any pain medicine. Pt said she was told by her sister she was supposed to get up every hour for 15 minutes each time. i told pt no that was not true. Can be on foot for 15 min every hour. Pt understood. Told pt to start using ice and that can be placed behind her knee. Pt understood.

## 2014-02-13 ENCOUNTER — Telehealth: Payer: Self-pay | Admitting: *Deleted

## 2014-02-13 ENCOUNTER — Other Ambulatory Visit: Payer: Self-pay

## 2014-02-13 MED ORDER — OXYCODONE HCL 15 MG PO TABS: 15.0000 mg | ORAL_TABLET | Freq: Three times a day (TID) | ORAL | Status: DC | PRN

## 2014-02-13 NOTE — Progress Notes (Signed)
1.Austin bunion repair with screw rt 2. 5th metatarsal osteotomy with screw rt

## 2014-02-13 NOTE — Telephone Encounter (Signed)
Spoke with Deniese and stated she had not taken her boot or bandage off and i told her not to. She asked if she felt as if she were getting a spot on the top of her leg and wanted to know if she could put neosporin on it. i said yes she could and if she felt the boot was rubbing she could put a washcloth or dishrag inside of her boot. Pt understood.  Pt was upset because of the pain medicine she is already receiving from dr Marjory Lies. She states she takes one every 8 hours. Pt states according to dr Milinda Pointer and anesthesia pt told them that she takes 3 pills 3 times daily. Pt said she did not state that and this has created a huge problem. Pt said dr Milinda Pointer called dr Marjory Lies and told her the wrong information. Dr Milinda Pointer states he did not speak with her doctor, he left a message for the doctor to return his call and she did not. Pt states since dr Milinda Pointer will not give her pain medication she is taking advil and she is allergic to it. According to pts file she is not allergic to advil. [facility] called and said they would not fill the rx for oxycodone because she is already receiving it from another doctor and wanted to talk to our office before the rx is filled. Per dr Milinda Pointer do not fill rx for oxycodone.

## 2014-02-13 NOTE — Telephone Encounter (Signed)
Pt was notified by [facility] that Dr Emily Moon had cancelled oxycodone rx due to pt receiving same pain med from Dr Emily Moon. Pt has appt with Dr Emily Moon on 02/15/14 and pt wants to know if Dr Emily Moon will write oxycodone rx and let pt pick up on 02/15/14 so pt will not have to pay someone to bring her back to the office to get the oxycodone rx next week. Pt said Dr Emily Moon can write rx with 02/22/14 date because that will be one month from when filled on 01/22/14. Pt request cb.

## 2014-02-13 NOTE — Telephone Encounter (Signed)
Spoke to Dr Stephenie Acres office who confirmed that Rx was not filled. Spoke to Emily Moon and informed her Rx is available for pickup at the front desk; informed a gov't issued photo id required.

## 2014-02-13 NOTE — Telephone Encounter (Signed)
Ok to refill one month as pt requests.  Please call Dr. Stephenie Acres office to confirm this.

## 2014-02-15 ENCOUNTER — Ambulatory Visit (INDEPENDENT_AMBULATORY_CARE_PROVIDER_SITE_OTHER): Payer: BC Managed Care – PPO | Admitting: Podiatry

## 2014-02-15 ENCOUNTER — Encounter: Payer: Self-pay | Admitting: Podiatry

## 2014-02-15 ENCOUNTER — Ambulatory Visit (INDEPENDENT_AMBULATORY_CARE_PROVIDER_SITE_OTHER): Payer: BC Managed Care – PPO

## 2014-02-15 VITALS — BP 131/64 | HR 105 | Temp 99.7°F | Resp 16

## 2014-02-15 DIAGNOSIS — Z9889 Other specified postprocedural states: Secondary | ICD-10-CM

## 2014-02-15 NOTE — Progress Notes (Signed)
She presents today vital signs stable alert oriented x3 status post Austin bunion repair and a fifth metatarsal osteotomy. She states that she's done just fine very little discomfort whatsoever. She denies fever chills nausea vomiting muscle aches and pains. States that she has not taken her boot off however she states that she stretched and felt a pop in her great toe.  Objective: Vital signs are stable she is alert and oriented x3. Pulses are intact dry sterile dressing was intact once removed demonstrates minimal edema no erythema saline is drainage or odor sutures attached a great range of motion of the first metatarsophalangeal joint dorsiflexion is somewhat limited and we so I imagine she tore her extensor hallucis longus tendon at least partially from the lengthening procedure that was performed. Radiographic evaluation demonstrates a very nice Futures trader with screw fixation intact fifth metatarsal osteotomy with screw fixation intact.  Assessment: Well-healing surgical foot right x1 week questioning whether or not she may have torn her extensor hallucis longus tendon right.  And: Discussed etiology pathology conservative versus surgical therapies at this point I put her in a compression dressing with the toe in extended position and put her back in her Cam Gilford Rile suggested she stay in this for at least one more week. I will followup with her in one week for evaluation.

## 2014-02-19 ENCOUNTER — Other Ambulatory Visit: Payer: Self-pay | Admitting: Family Medicine

## 2014-02-20 NOTE — Telephone Encounter (Signed)
Pt requesting medication refill. Last ov 01/2014 with no future appts scheduled. pls advise

## 2014-02-20 NOTE — Telephone Encounter (Signed)
Emily Moon with [facility] requested restoril rx verified with instructions, quantity and name of doctor prescribing. Info verified.

## 2014-02-20 NOTE — Telephone Encounter (Signed)
Spoke to pt and informed her Rx has been faxed to requested pharmacy 

## 2014-02-22 ENCOUNTER — Encounter: Payer: Self-pay | Admitting: Podiatry

## 2014-02-22 ENCOUNTER — Ambulatory Visit (INDEPENDENT_AMBULATORY_CARE_PROVIDER_SITE_OTHER): Payer: BC Managed Care – PPO | Admitting: Podiatry

## 2014-02-22 ENCOUNTER — Ambulatory Visit: Payer: Self-pay

## 2014-02-22 VITALS — BP 111/65 | HR 96 | Temp 99.2°F | Resp 16

## 2014-02-22 DIAGNOSIS — Z9889 Other specified postprocedural states: Secondary | ICD-10-CM

## 2014-02-22 NOTE — Progress Notes (Signed)
She presents today 2 weeks status post Austin bunion repair right foot and fifth metatarsal osteotomy right foot she denies fever chills nausea vomiting muscle aches and pains. She continues to perform her exercises daily she says.  Objective: Vital signs are stable she is alert and oriented x3 he presents with her Cam Walker intact as well as her dry sterile dressing. Once removed demonstrates minimal edema no erythema cellulitis drainage or odor.  Assessment: Well-healing surgical foot right.  Plan: Removed stitches today margins remain well coapted put her in a compression dressing and a Darco shoe I will followup with her in 2 weeks. I will allow her to start getting this wet today.

## 2014-02-23 ENCOUNTER — Telehealth: Payer: Self-pay | Admitting: *Deleted

## 2014-02-23 NOTE — Telephone Encounter (Signed)
Pt states that yesterday dr Milinda Pointer ok'd her to start showering , she took a shower last night and her incision line started to ooze a little bit. She states that she used wound cleaner that she had left over from her hip.  I explained to patient that it has sound like she was up on her foot just a little too much and that she should soak in epsom salt and keep it elevated as much as possible. She is going to apply a light dressing to her foot and follow up with [place] Monday if not any better

## 2014-03-06 ENCOUNTER — Telehealth: Payer: Self-pay | Admitting: Family Medicine

## 2014-03-06 NOTE — Telephone Encounter (Signed)
Patient Information:  Caller Name: Suha  Phone: [phone]  Patient: Emily Moon  Gender: Female  DOB: April 25, 1961  Age: 53 Years  PCP: Arnette Norris [facility])  Pregnant: No  Office Follow Up:  Does the office need to follow up with this patient?: No  Instructions For The Office: N/A  RN Note:  She is going to try warm fluids and saline nasal spray and will go to UC if sx worsening.  Symptoms  Reason For Call & Symptoms: Woke up last night  @ 0300 with throat sore, drainage in throat and difficulty swallowing. Hx allergies and takes Zyrtec daily.  She has muscle stiffness and weakness for past week- Hx MS. Sx worse with laying down and is starting with some mild wheezing. She used inhaler and helped. Some tenderness on forehead but denies headaches or nausea. Ears feel "full". Has appointment for 0900 03/07/14.  Reviewed Health History In EMR: Yes  Reviewed Medications In EMR: Yes  Reviewed Allergies In EMR: Yes  Reviewed Surgeries / Procedures: Yes  Date of Onset of Symptoms: 03/06/2014  Treatments Tried: Salt water gargle  Treatments Tried Worked: No OB / GYN:  LMP: Unknown  Guideline(s) Used:  Sore Throat  Ear - Congestion  Sinus Pain and Congestion  Disposition Per Guideline:   Home Care  Reason For Disposition Reached:   Sinus congestion as part of a cold, present < 10 days  Advice Given:  For Relief of Sore Throat Pain:  Sip warm chicken broth or apple juice.  Suck on hard candy or a throat lozenge (over-the-counter).  Gargle warm salt water 3 times daily (1 teaspoon of salt in 8 oz or 240 ml of warm water).  Avoid cigarette smoke.  Soft Diet:   Cold drinks and milk shakes are especially good (Reason: swollen tonsils can make some foods hard to swallow).  Liquids:  Adequate liquid intake is important to prevent dehydration. Drink 6-8 glasses of water per day.  Contagiousness:   You can return to work or school after the fever is gone and you feel  well enough to participate in normal activities. If your doctor determines that you have Strep throat, then you will need to take an antibiotic for 24 hours before you can return.  Expected Course:  Sore throats with viral illnesses usually last 3 or 4 days.  Reassurance:  Definition: Ear congestion is the medical term used to describe symptoms of a stuffy, full, or plugged sensation in the ear. People also sometimes describe crackling or popping noises in the ear. Sometimes hearing seems slightly muffled.  Eustacian tube: There is a small collapsible tube that runs between the middle ear and the nose. Normally, it permits tiny amounts of air to move in and out of the middle ear. When the tube gets blocked, air or fluid can build up behind the ear drum (tympanic membrane). This causes the symptoms of ear congestion.  Causes  Upper respiratory infections (colds) and nasal allergies (hay fever) can block the eustachian tube.  Blowing the nose too hard can also push air and fluid into the eustachian tube.  Air travelers can get ear congestion. This happens because of the changes in air pressure as the air plane takes off and lands.  Expected Course:   The symptoms usually get better within 2 days (48 hours) with treatment.  It is safe to swim.  Call Back If:   Ear congestion lasts over 48 hours  Ear pain or fever occurs  You become worse.  Patient Will Follow Care Advice:  YES

## 2014-03-07 ENCOUNTER — Encounter: Payer: Self-pay | Admitting: Family Medicine

## 2014-03-07 ENCOUNTER — Encounter: Payer: Self-pay | Admitting: Internal Medicine

## 2014-03-07 ENCOUNTER — Ambulatory Visit (INDEPENDENT_AMBULATORY_CARE_PROVIDER_SITE_OTHER): Payer: BC Managed Care – PPO | Admitting: Family Medicine

## 2014-03-07 VITALS — BP 122/78 | HR 82 | Temp 98.2°F | Wt 208.2 lb

## 2014-03-07 DIAGNOSIS — M21961 Unspecified acquired deformity of right lower leg: Secondary | ICD-10-CM

## 2014-03-07 DIAGNOSIS — M21969 Unspecified acquired deformity of unspecified lower leg: Secondary | ICD-10-CM

## 2014-03-07 DIAGNOSIS — R1310 Dysphagia, unspecified: Secondary | ICD-10-CM

## 2014-03-07 DIAGNOSIS — H6980 Other specified disorders of Eustachian tube, unspecified ear: Secondary | ICD-10-CM | POA: Insufficient documentation

## 2014-03-07 NOTE — Assessment & Plan Note (Signed)
?   Related to MS vs esophageal stricture. Will refer for endoscopy. The patient indicates understanding of these issues and agrees with the plan.

## 2014-03-07 NOTE — Progress Notes (Signed)
Subjective:   Patient ID: Emily Moon, female    DOB: 07/03/1961, 52 y.o.   MRN: [id number]  Emily Moon is a pleasant 53 y.o. year old female who presents to clinic today with Dysphagia  on 2/40/9735  HPI: Complicated history, including MS.  Last saw Dr. Tomi Likens in 01/2014.  Note reviewed.  Not currently taking any MS meds per her request.  Also refusing to have repeat MRI that he suggested.  Difficulty swallowing- 3 night ago, woke up with sensation that she could swallow her saliva.  Also had mucous in her mouth.  Called RN on call and she suggested warm fluids.  That helped.  No issues today.  Over past 6 months, she does endorse difficulty swallowing liquids, no solids.  She is a current smoker.  Denies weight loss. Wt Readings from Last 3 Encounters:  03/07/14 208 lb 4 oz (94.462 kg)  01/24/14 204 lb 14.4 oz (92.942 kg)  01/22/14 205 lb 8 oz (93.214 kg)   Also would like to discuss her recent foot surgery- 3/27- Dr. Milinda Pointer.  She feels she is healing well but would like for me to look at the incision.  Ears also feel full and popping.  Patient Active Problem List   Diagnosis Date Noted  . Difficulty in swallowing 03/07/2014  . ETD (eustachian tube dysfunction) 03/07/2014  . Nail fungus 01/22/2014  . Injury of right hand 12/27/2013  . 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 Grief 11/29/2013  . 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 Insomnia 09/01/2011  . Osteoarthritis of hip 08/05/2011  . Obesity 05/04/2011  . Perimenopausal vasomotor symptoms 02/05/2011  . HYPERLIPIDEMIA 01/20/2011  . SMOKER 01/20/2011  . Chronic pain syndrome 01/07/2011  . RENAL CALCULUS, RECURRENT 11/13/2010  . OTHER AND UNSPECIFIED BIPOLAR DISORDERS 08/28/2010  . DEPRESSION 08/28/2010  . MULTIPLE SCLEROSIS, PROGRESSIVE/RELAPSING 08/28/2010  . GERD 08/28/2010  . OSTEOARTHRITIS 08/28/2010  . NEPHROLITHIASIS, HX OF 08/28/2010    Past Medical History  Diagnosis Date  . Bipolar disorder   . Multiple sclerosis   . Depression   . GERD (gastroesophageal reflux disease)   . Nephrolithiasis   . Osteoporosis     osteoarthritis  . Fibromyalgia syndrome   . Hip dysplasia, 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   Past Surgical History  Procedure Laterality Date  . Tonsillectomy    . Joint replacement Left 2013    hip replacement   History  Substance Use Topics  . Smoking status: Current Every Day Smoker -- 0.50 packs/day for 29 years    Types: Cigarettes  . Smokeless tobacco: Never Used     Comment: given   . Alcohol Use: No   Family History  Problem Relation Age of Onset  . Cancer Father    Allergies  Allergen Reactions  . Tylenol [Acetaminophen] Shortness Of Breath and Swelling  . Ultram [Tramadol Hcl]   . Aripiprazole   . Cefaclor   . Diclofenac Sodium   . Ibuprofen   . Naproxen Sodium    Current Outpatient Prescriptions on File Prior to Visit  Medication Sig Dispense Refill  . aspirin 325 MG tablet Take 325 mg by mouth as needed.      Marland Kitchen BIOTIN PO Take 600 mg by mouth daily.      . calcium carbonate (TUMS - DOSED IN MG ELEMENTAL CALCIUM) 500 MG chewable tablet Chew 3 tablets by mouth daily.      . cetirizine (ZYRTEC) 10 MG tablet Take  10 mg by mouth daily.      . Cholecalciferol (VITAMIN D-3 PO) Take 6,000 mg by mouth daily.      . ciclopirox (PENLAC) 8 % solution Apply topically at bedtime. Apply over nail and surrounding skin. Apply daily over previous coat. After seven (7) days, may remove with alcohol and continue cycle.  6.6 mL  0  . clindamycin (CLEOCIN) 150 MG capsule Take 1 capsule (150 mg total) by mouth 3 (three) times daily.  30 capsule  0  . diclofenac sodium (VOLTAREN) 1 % GEL Apply 4 g topically 4 (four) times daily.  500 g  5  . DULoxetine (CYMBALTA) 60 MG capsule TAKE ONE CAPSULE BY MOUTH ONE TIME DAILY   30 capsule  2  . hydrOXYzine (ATARAX/VISTARIL) 25 MG tablet TAKE TWO TABLETS BY MOUTH  NIGHTLY AT BEDTIME   60 tablet  5  . lamoTRIgine (LAMICTAL) 150 MG tablet Take two tablets by mouth daily  180 tablet  3  . levalbuterol (XOPENEX HFA) 45 MCG/ACT inhaler Inhale 1-2 puffs into the lungs every 4 (four) hours as needed.  1 Inhaler  0  . Lysine 1000 MG TABS Take 1,000 mg by mouth daily.      . meperidine (DEMEROL) 50 MG tablet Take one to two by mouth every six to eight hours as needed for pain.  50 tablet  0  . methylphenidate (RITALIN) 10 MG tablet Take 10 mg by mouth as needed.      . mirabegron ER (MYRBETRIQ) 25 MG TB24 tablet Take 25 mg by mouth daily.      Marland Kitchen oxyCODONE (ROXICODONE) 15 MG immediate release tablet Take 1 tablet (15 mg total) by mouth every 8 (eight) hours as needed for pain.  90 tablet  0  . polyethylene glycol powder (GLYCOLAX/MIRALAX) powder Take 17 g by mouth 2 (two) times daily as needed.  3350 g  1  . promethazine (PHENERGAN) 12.5 MG tablet Take 2 tablets (25 mg total) by mouth every 6 (six) hours as needed for nausea or vomiting.  30 tablet  0  . silodosin (RAPAFLO) 4 MG CAPS capsule Take 4 mg by mouth daily with breakfast.      . temazepam (RESTORIL) 30 MG capsule TAKE ONE TO TWO CAPSULES BY MOUTH NIGHTLY AT BEDTIME AS NEEDED   60 capsule  0  . tiZANidine (ZANAFLEX) 4 MG tablet Take 1 tablet by mouth every 6 hours as needed  90 tablet  0   No current facility-administered medications on file prior to visit.   The PMH, PSH, Social History, Family History, Medications, and allergies have been reviewed in [facility], and have been updated if relevant.   Review of Systems See HPI No fevers No nausea or vomiting Weight stable- Wt Readings from Last 3 Encounters:  03/07/14 208 lb 4 oz (94.462 kg)  01/24/14 204 lb 14.4 oz (92.942 kg)  01/22/14 205 lb 8 oz (93.214 kg)   No drainage from ear No hearing loss No drainage from incision    Objective:    BP 122/78  Pulse 82  Temp(Src) 98.2 F (36.8 C) (Oral)  Wt 208 lb 4 oz (94.462 kg)  SpO2 98%  LMP  02/24/2014   Physical Exam  Nursing note and vitals reviewed. Constitutional: She appears well-developed and well-nourished. No distress.  HENT:  Right Ear: No mastoid tenderness. Tympanic membrane is not erythematous and not retracted.  Left Ear: No mastoid tenderness. Tympanic membrane is retracted. Tympanic membrane is not erythematous.  Mouth/Throat: Uvula is midline, oropharynx is clear and moist and mucous membranes are normal.  Skin:     Psychiatric: Her speech is tangential.          Assessment & Plan:   Difficulty in swallowing - Plan: Ambulatory referral to Gastroenterology  Deformity of right foot  ETD (eustachian tube dysfunction) No Follow-up on file.

## 2014-03-07 NOTE — Assessment & Plan Note (Signed)
Incision healing well

## 2014-03-07 NOTE — Patient Instructions (Signed)
Good to see you. Please stop by to see Rosaria Ferries on your way out to set up your GI referral for endoscopy.

## 2014-03-07 NOTE — Progress Notes (Signed)
Pre visit review using our clinic review tool, if applicable. No additional management support is needed unless otherwise documented below in the visit note. 

## 2014-03-07 NOTE — Assessment & Plan Note (Signed)
Continue zyrtec. Advised adding steroid OTC nasal spray. Call or return to clinic prn if these symptoms worsen or fail to improve as anticipated. The patient indicates understanding of these issues and agrees with the plan.

## 2014-03-08 ENCOUNTER — Ambulatory Visit (INDEPENDENT_AMBULATORY_CARE_PROVIDER_SITE_OTHER): Payer: BC Managed Care – PPO

## 2014-03-08 ENCOUNTER — Ambulatory Visit (INDEPENDENT_AMBULATORY_CARE_PROVIDER_SITE_OTHER): Payer: BC Managed Care – PPO | Admitting: Podiatry

## 2014-03-08 VITALS — Resp 16 | Ht 69.0 in | Wt 200.0 lb

## 2014-03-08 DIAGNOSIS — Z9889 Other specified postprocedural states: Secondary | ICD-10-CM

## 2014-03-09 NOTE — Progress Notes (Signed)
She presents today proximally through 4 weeks status post [facility] bunion repair right and fifth metatarsal osteotomy right. She denies fever chills nausea vomits states she's doing quite well.  Objective: Vital signs are stable she is alert and oriented x3. She's great range of motion the first metatarsophalangeal joint and minimal pain on palpation of the fifth. Incisions to be healing quite nicely no signs of infection.  Assessment: Postsurgical foot.  Plan: Let her get into a Darco shoe today as well as an anklet and I will followup with her in 2 weeks.

## 2014-03-12 ENCOUNTER — Other Ambulatory Visit: Payer: Self-pay | Admitting: Family Medicine

## 2014-03-13 ENCOUNTER — Encounter: Payer: BC Managed Care – PPO | Admitting: Internal Medicine

## 2014-03-13 NOTE — Progress Notes (Deleted)
Emily Moon 09/19/1961 [id number]  Note: This dictation was prepared with Dragon digital system. Any transcriptional errors that result from this procedure are unintentional.   History of Present Illness: This is a 53 year old female with history of multiple sclerosis followed by Dr. Nicki Guadalajara, complaining of dysphagia to liquids for several months months. She is a current smoker. There is a history of fibromyalgia, depression, bipolar disorder and gastroesophageal reflux disease. She has been on Cymbalta , Lamictal and Ritalin    Past Medical History  Diagnosis Date  . Bipolar disorder   . Multiple sclerosis   . Depression   . GERD (gastroesophageal reflux disease)   . Nephrolithiasis   . Osteoporosis     osteoarthritis  . Fibromyalgia syndrome   . Hip dysplasia, 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    Past Surgical History  Procedure Laterality Date  . Tonsillectomy    . Joint replacement Left 2013    hip replacement    Allergies  Allergen Reactions  . Tylenol [Acetaminophen] Shortness Of Breath and Swelling  . Ultram [Tramadol Hcl]   . Aripiprazole   . Cefaclor   . Diclofenac Sodium   . Ibuprofen   . Naproxen Sodium     Family history and social history have been reviewed.  Review of Systems:   The remainder of the 10 point ROS is negative except as outlined in the H&P  Physical Exam: General Appearance Well developed, in no distress Eyes  Non icteric  HEENT  Non traumatic, normocephalic  Mouth No lesion, tongue papillated, no cheilosis Neck Supple without adenopathy, thyroid not enlarged, no carotid bruits, no JVD Lungs Clear to auscultation bilaterally COR Normal S1, normal S2, regular rhythm, no murmur, quiet precordium Abdomen  Rectal  Extremities  No pedal edema Skin No lesions Neurological Alert and oriented x 3 Psychological Normal mood and affect  Assessment and Plan:     [place] Dragon 03/13/2014

## 2014-03-14 ENCOUNTER — Encounter: Payer: Self-pay | Admitting: Internal Medicine

## 2014-03-14 NOTE — Progress Notes (Signed)
This encounter was created in error - please disregard.

## 2014-03-19 ENCOUNTER — Other Ambulatory Visit: Payer: Self-pay

## 2014-03-19 MED ORDER — OXYCODONE HCL 15 MG PO TABS: 15.0000 mg | ORAL_TABLET | Freq: Three times a day (TID) | ORAL | Status: DC | PRN

## 2014-03-19 NOTE — Telephone Encounter (Signed)
Informed patient by phone, prescription is ready for pick up.

## 2014-03-19 NOTE — Telephone Encounter (Signed)
Pt left v/m requesting rx oxycodone; call when ready for pick up. Pt request to pick up rx this AM.

## 2014-03-29 ENCOUNTER — Ambulatory Visit (INDEPENDENT_AMBULATORY_CARE_PROVIDER_SITE_OTHER): Payer: BC Managed Care – PPO | Admitting: Podiatry

## 2014-03-29 ENCOUNTER — Encounter: Payer: Self-pay | Admitting: Podiatry

## 2014-03-29 ENCOUNTER — Ambulatory Visit (INDEPENDENT_AMBULATORY_CARE_PROVIDER_SITE_OTHER): Payer: BC Managed Care – PPO

## 2014-03-29 VITALS — BP 121/62 | HR 98 | Resp 16

## 2014-03-29 DIAGNOSIS — Z9889 Other specified postprocedural states: Secondary | ICD-10-CM

## 2014-03-29 DIAGNOSIS — M21619 Bunion of unspecified foot: Secondary | ICD-10-CM

## 2014-03-29 NOTE — Progress Notes (Signed)
She presents today approximately 6 weeks postop. Status post [facility] bunion repair right and fifth metatarsal osteotomy right. She denies fever chills nausea vomiting muscle aches and pains.  Objective: Vital signs are stable she is alert and oriented x3 she presents in a pair tennis shoes today. She has good range of motion about the first metatarsophalangeal joint of the right foot with a very little symptomatology. No erythema edema cellulitis drainage or odor.  Assessment: Well-healing surgical foot right.  Plan: Continue the use of her tennis shoes and I will followup with her in the near future for reevaluation.

## 2014-04-03 ENCOUNTER — Other Ambulatory Visit: Payer: Self-pay

## 2014-04-03 MED ORDER — TIZANIDINE HCL 4 MG PO TABS: ORAL_TABLET | ORAL | Status: DC

## 2014-04-03 NOTE — Telephone Encounter (Signed)
Pt left v/m; pt said normally she gets # 60 for tizanidine and last refill in 02/2014 was # 8.pt request refill tizanidine for # 60 to [facility].Pt request cb when refilled.

## 2014-04-04 ENCOUNTER — Encounter: Payer: BC Managed Care – PPO | Admitting: Podiatry

## 2014-04-11 ENCOUNTER — Encounter: Payer: Self-pay | Admitting: Family Medicine

## 2014-04-21 ENCOUNTER — Other Ambulatory Visit: Payer: Self-pay | Admitting: Family Medicine

## 2014-04-26 ENCOUNTER — Other Ambulatory Visit: Payer: Self-pay | Admitting: Family Medicine

## 2014-04-27 ENCOUNTER — Encounter: Payer: Self-pay | Admitting: Family Medicine

## 2014-04-27 ENCOUNTER — Ambulatory Visit (INDEPENDENT_AMBULATORY_CARE_PROVIDER_SITE_OTHER): Payer: BC Managed Care – PPO | Admitting: Family Medicine

## 2014-04-27 VITALS — BP 100/78 | HR 98 | Temp 99.0°F | Ht 69.0 in | Wt 192.8 lb

## 2014-04-27 DIAGNOSIS — I889 Nonspecific lymphadenitis, unspecified: Secondary | ICD-10-CM

## 2014-04-27 DIAGNOSIS — M542 Cervicalgia: Secondary | ICD-10-CM

## 2014-04-27 MED ORDER — METHYLPHENIDATE HCL 10 MG PO TABS: 10.0000 mg | ORAL_TABLET | ORAL | Status: DC | PRN

## 2014-04-27 MED ORDER — AMOXICILLIN-POT CLAVULANATE 875-125 MG PO TABS: 1.0000 | ORAL_TABLET | Freq: Two times a day (BID) | ORAL | Status: DC

## 2014-04-27 NOTE — Progress Notes (Signed)
Pre visit review using our clinic review tool, if applicable. No additional management support is needed unless otherwise documented below in the visit note. 

## 2014-04-27 NOTE — Progress Notes (Signed)
[place] [address] Phone: [phone] Fax: [phone]  Patient ID: Emily Moon MRN: [id number], DOB: 1961-05-09, 53 y.o. Date of Encounter: 04/27/2014  Primary Physician:  Arnette Norris, MD   Chief Complaint: Jaw Pain and Oral Pain   Subjective:   History of Present Illness:  Emily Moon is a 53 y.o. very pleasant female patient who presents with the following:  Now getting really to go on date and swollen gland on the R side.   R eye biopsy.  Wt Readings from Last 3 Encounters:  04/27/14 192 lb 12 oz (87.431 kg)  03/08/14 200 lb (90.719 kg)  03/07/14 208 lb 4 oz (94.462 kg)    Her past few days she started to develop some swelling on the inferior aspect underneath her chin, and she also has some pain, more on the RIGHT side. She also has some swelling on the RIGHT side of her neck. She also has some pain in the back of her RIGHT throat as well.  She is status post tonsillectomy.  Past Medical History, Surgical History, Social History, Family History, Problem List, Medications, and Allergies have been reviewed and updated if relevant.  Review of Systems:  GEN: No acute illnesses, no fevers, chills. GI: No n/v/d, eating normally Pulm: No SOB Interactive and getting along well at home.  Otherwise, ROS is as per the HPI.  Objective:   Physical Examination: BP 100/78  Pulse 98  Temp(Src) 99 F (37.2 C) (Oral)  Ht 5\' 9"  (1.753 m)  Wt 192 lb 12 oz (87.431 kg)  BMI 28.45 kg/m2  LMP 04/27/2014   GEN: WDWN, NAD, Non-toxic, Alert & Oriented x 3 HEENT: Atraumatic, Normocephalic. At the submental gland, it is somewhat tender to palpation, more on the RIGHT. She also has some tenderness to palpation in her anterior cervical chain. Both of her ears look good and her normal with normal tympanic membranes. Ears and Nose: No external deformity. EXTR: No clubbing/cyanosis/edema NEURO: Normal gait.  PSYCH: Normally interactive. Conversant. Not  depressed or anxious appearing.  Calm demeanor.   Laboratory and Imaging Data:  Assessment & Plan:   Submental lymphadenitis  Neck pain on right side  I discussed it could be a number of things, including sometimes just a blocked duct. Encouraged her to eat some candidate or so consult ice cubes to try to initiate saliva formation. Also could be early infection, so if she has continued symptoms, or it worsens in any way, recommended that she start taking some Augmentin.  Refill the patient's Ritalin.  New Prescriptions   AMOXICILLIN-CLAVULANATE (AUGMENTIN) 875-125 MG PER TABLET    Take 1 tablet by mouth 2 (two) times daily.   Modified Medications   Modified Medication Previous Medication   METHYLPHENIDATE (RITALIN) 10 MG TABLET methylphenidate (RITALIN) 10 MG tablet      Take 1 tablet (10 mg total) by mouth as needed.    Take 10 mg by mouth as needed.   No orders of the defined types were placed in this encounter.   Follow-up: No Follow-up on file. Unless noted above, the patient is to follow-up if symptoms worsen. Red flags were reviewed with the patient.  Signed,  Maud Deed. Linzy Darling, MD, CA[facility] Sports Medicine   Discontinued Medications   No medications on file   Current Medications at Discharge:   Medication List       This list is accurate as of: 04/27/14 11:59 PM.  Always use your most recent med list.  amoxicillin-clavulanate 875-125 MG per tablet  Commonly known as:  AUGMENTIN  Take 1 tablet by mouth 2 (two) times daily.     aspirin 325 MG tablet  Take 325 mg by mouth as needed.     BIOTIN PO  Take 600 mg by mouth daily.     calcium carbonate 500 MG chewable tablet  Commonly known as:  TUMS - dosed in mg elemental calcium  Chew 3 tablets by mouth daily.     cetirizine 10 MG tablet  Commonly known as:  ZYRTEC  Take 10 mg by mouth daily.     ciclopirox 8 % solution  Commonly known as:  PENLAC  Apply topically at bedtime. Apply over nail  and surrounding skin. Apply daily over previous coat. After seven (7) days, may remove with alcohol and continue cycle.     clindamycin 150 MG capsule  Commonly known as:  CLEOCIN  Take 1 capsule (150 mg total) by mouth 3 (three) times daily.     desonide 0.05 % cream  Commonly known as:  DESOWEN     diclofenac sodium 1 % Gel  Commonly known as:  VOLTAREN  Apply 4 g topically 4 (four) times daily.     DULoxetine 60 MG capsule  Commonly known as:  CYMBALTA  TAKE ONE CAPSULE BY MOUTH ONE TIME DAILY     erythromycin ophthalmic ointment  Place 1 application into the right eye 3 (three) times daily.     hydrOXYzine 25 MG tablet  Commonly known as:  ATARAX/VISTARIL  TAKE TWO TABLETS BY MOUTH NIGHTLY AT BEDTIME     lamoTRIgine 150 MG tablet  Commonly known as:  LAMICTAL  Take two tablets by mouth daily     levalbuterol 45 MCG/ACT inhaler  Commonly known as:  XOPENEX HFA  Inhale 1-2 puffs into the lungs every 4 (four) hours as needed.     Lysine 1000 MG Tabs  Take 1,000 mg by mouth daily.     methylphenidate 10 MG tablet  Commonly known as:  RITALIN  Take 1 tablet (10 mg total) by mouth as needed.     MYRBETRIQ 25 MG Tb24 tablet  Generic drug:  mirabegron ER  Take 25 mg by mouth daily.     oxyCODONE 15 MG immediate release tablet  Commonly known as:  ROXICODONE  Take 1 tablet (15 mg total) by mouth every 8 (eight) hours as needed for pain.     polyethylene glycol powder powder  Commonly known as:  GLYCOLAX/MIRALAX  Take 17 g by mouth 2 (two) times daily as needed.     promethazine 12.5 MG tablet  Commonly known as:  PHENERGAN  Take 2 tablets (25 mg total) by mouth every 6 (six) hours as needed for nausea or vomiting.     silodosin 4 MG Caps capsule  Commonly known as:  RAPAFLO  Take 4 mg by mouth daily with breakfast.     temazepam 30 MG capsule  Commonly known as:  RESTORIL  TAKE ONE TO TWO CAPSULES BY MOUTH NIGHTLY AT BEDTIME AS NEEDED     tiZANidine 4 MG  tablet  Commonly known as:  ZANAFLEX  TAKE ONE TABLET BY MOUTH EVERY SIX HOURS AS NEEDED     VITAMIN D-3 PO  Take 6,000 mg by mouth daily.

## 2014-04-27 NOTE — Telephone Encounter (Signed)
Spoke to pt and informed her Rx has been called in to requested pharmacy 

## 2014-04-27 NOTE — Telephone Encounter (Signed)
Pt requesting medication refill. Last f/u appt 2012 but pt has been seen multiple times for acute. pls advise

## 2014-05-04 ENCOUNTER — Encounter (INDEPENDENT_AMBULATORY_CARE_PROVIDER_SITE_OTHER): Payer: Self-pay

## 2014-05-04 ENCOUNTER — Ambulatory Visit (INDEPENDENT_AMBULATORY_CARE_PROVIDER_SITE_OTHER): Payer: BC Managed Care – PPO | Admitting: Internal Medicine

## 2014-05-04 ENCOUNTER — Encounter: Payer: Self-pay | Admitting: Internal Medicine

## 2014-05-04 VITALS — BP 104/66 | HR 114 | Temp 99.0°F | Wt 196.0 lb

## 2014-05-04 DIAGNOSIS — J0110 Acute frontal sinusitis, unspecified: Secondary | ICD-10-CM

## 2014-05-04 MED ORDER — METHYLPREDNISOLONE ACETATE 80 MG/ML IJ SUSP
80.0000 mg | Freq: Once | INTRAMUSCULAR | Status: AC
  Administered 2014-05-04: 80 mg via INTRAMUSCULAR

## 2014-05-04 NOTE — Progress Notes (Signed)
HPI  Pt presents to the clinic today with c/o runny nose, post nasal drip, cough and fever. She reports this started 4 days ago. She has run fevers up to 101.0. She has no history of allergies or asthma. She reports that she did starting taking augmentin she had left over for the past 3 days. She feels like her symptoms are getting worse. She did quit smoking 1 week ago.  Review of Systems    Past Medical History  Diagnosis Date  . Bipolar disorder   . Multiple sclerosis   . Depression   . GERD (gastroesophageal reflux disease)   . Nephrolithiasis   . Osteoporosis     osteoarthritis  . Fibromyalgia syndrome   . 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 Cancer Father     History   Social History  . Marital Status: Divorced    Spouse Name: N/A    Number of Children: 1  . Years of Education: N/A   Occupational History  . Customer Service Rep at [employer]  . Smoking status: Former Smoker -- 0.50 packs/day for 29 years    Types: Cigarettes  . Smokeless tobacco: Never Used     Comment: recenty 04/26/2014  . Alcohol Use: No  . Drug Use: No  . Sexual Activity: No   Other Topics Concern  . Not on file   Social History Narrative  . No narrative on file    Allergies  Allergen Reactions  . Tylenol [Acetaminophen] Shortness Of Breath and Swelling  . Ultram [Tramadol Hcl]   . Aripiprazole   . Cefaclor   . Diclofenac Sodium   . Ibuprofen   . Naproxen Sodium      Constitutional: Positive headache, fatigue and fever. Denies abrupt weight changes.  HEENT:  Positive facial pain, nasal congestion and sore throat. Denies eye redness, ear pain, ringing in the ears, wax buildup, runny nose or bloody nose. Respiratory: Positive cough. Denies difficulty breathing or shortness of breath.  Cardiovascular: Denies chest pain, chest tightness, palpitations or swelling in the hands or feet.   No  other specific complaints in a complete review of systems (except as listed in HPI above).  Objective:    General: Appears her stated age, well developed, well nourished in NAD. HEENT: Head: normal shape and size, frontal sinus tenderness noted; Eyes: sclera white, no icterus, conjunctiva pink, PERRLA and EOMs intact; Ears: Tm's gray and intact, normal light reflex; Nose: mucosa pink and moist, septum midline; Throat/Mouth: + PND. Teeth present, mucosa pink and moist, no exudate noted, no lesions or ulcerations noted.  Neck: Neck supple, trachea midline. No massses, lumps or thyromegaly present.  Cardiovascular: Normal rate and rhythm. S1,S2 noted.  No murmur, rubs or gallops noted. No JVD or BLE edema. No carotid bruits noted. Pulmonary/Chest: Normal effort and positive vesicular breath sounds. No respiratory distress. No wheezes, rales or ronchi noted.      Assessment & Plan:   Sinusitis  80 mg Depo IM Can use a Neti Pot which can be purchased from your local drug store. Flonase 2 sprays each nostril for 3 days and then as needed. Finish taking the Augmentin that you already started  RTC as needed or if symptoms persist.

## 2014-05-04 NOTE — Addendum Note (Signed)
Addended by: Lurlean Nanny on: 05/04/2014 11:43 AM   Modules accepted: Orders

## 2014-05-04 NOTE — Patient Instructions (Addendum)
Sinusitis Sinusitis is redness, soreness, and swelling (inflammation) of the paranasal sinuses. Paranasal sinuses are air pockets within the bones of your face (beneath the eyes, the middle of the forehead, or above the eyes). In healthy paranasal sinuses, mucus is able to drain out, and air is able to circulate through them by way of your nose. However, when your paranasal sinuses are inflamed, mucus and air can become trapped. This can allow bacteria and other germs to grow and cause infection. Sinusitis can develop quickly and last only a short time (acute) or continue over a long period (chronic). Sinusitis that lasts for more than 12 weeks is considered chronic.  CAUSES  Causes of sinusitis include:  Allergies.  Structural abnormalities, such as displacement of the cartilage that separates your nostrils (deviated septum), which can decrease the air flow through your nose and sinuses and affect sinus drainage.  Functional abnormalities, such as when the small hairs (cilia) that line your sinuses and help remove mucus do not work properly or are not present. SYMPTOMS  Symptoms of acute and chronic sinusitis are the same. The primary symptoms are pain and pressure around the affected sinuses. Other symptoms include:  Upper toothache.  Earache.  Headache.  Bad breath.  Decreased sense of smell and taste.  A cough, which worsens when you are lying flat.  Fatigue.  Fever.  Thick drainage from your nose, which often is green and may contain pus (purulent).  Swelling and warmth over the affected sinuses. DIAGNOSIS  Your caregiver will perform a physical exam. During the exam, your caregiver may:  Look in your nose for signs of abnormal growths in your nostrils (nasal polyps).  Tap over the affected sinus to check for signs of infection.  View the inside of your sinuses (endoscopy) with a special imaging device with a light attached (endoscope), which is inserted into your  sinuses. If your caregiver suspects that you have chronic sinusitis, one or more of the following tests may be recommended:  Allergy tests.  Nasal culture--A sample of mucus is taken from your nose and sent to a lab and screened for bacteria.  Nasal cytology--A sample of mucus is taken from your nose and examined by your caregiver to determine if your sinusitis is related to an allergy. TREATMENT  Most cases of acute sinusitis are related to a viral infection and will resolve on their own within 10 days. Sometimes medicines are prescribed to help relieve symptoms (pain medicine, decongestants, nasal steroid sprays, or saline sprays).  However, for sinusitis related to a bacterial infection, your caregiver will prescribe antibiotic medicines. These are medicines that will help kill the bacteria causing the infection.  Rarely, sinusitis is caused by a fungal infection. In theses cases, your caregiver will prescribe antifungal medicine. For some cases of chronic sinusitis, surgery is needed. Generally, these are cases in which sinusitis recurs more than 3 times per year, despite other treatments. HOME CARE INSTRUCTIONS   Drink plenty of water. Water helps thin the mucus so your sinuses can drain more easily.  Use a humidifier.  Inhale steam 3 to 4 times a day (for example, sit in the bathroom with the shower running).  Apply a warm, moist washcloth to your face 3 to 4 times a day, or as directed by your caregiver.  Use saline nasal sprays to help moisten and clean your sinuses.  Take over-the-counter or prescription medicines for pain, discomfort, or fever only as directed by your caregiver. SEEK IMMEDIATE MEDICAL CARE IF:    You have increasing pain or severe headaches.  You have nausea, vomiting, or drowsiness.  You have swelling around your face.  You have vision problems.  You have a stiff neck.  You have difficulty breathing. MAKE SURE YOU:   Understand these  instructions.  Will watch your condition.  Will get help right away if you are not doing well or get worse. Document Released: 11/02/2005 Document Revised: 01/25/2012 Document Reviewed: 11/17/2011 Exit[employer] Patient Information 2015 [employer]. This information is not intended to replace advice given to you by your health care provider. Make sure you discuss any questions you have with your health care provider.  

## 2014-05-04 NOTE — Progress Notes (Signed)
Pre visit review using our clinic review tool, if applicable. No additional management support is needed unless otherwise documented below in the visit note. 

## 2014-05-07 ENCOUNTER — Encounter: Payer: BC Managed Care – PPO | Admitting: Podiatry

## 2014-05-08 ENCOUNTER — Telehealth: Payer: Self-pay | Admitting: Internal Medicine

## 2014-05-08 NOTE — Telephone Encounter (Signed)
Message copied by Oliva Bustard on Tue May 08, 2014  2:56 PM ------      Message from: Larina Bras      Created: Wed Mar 14, 2014  8:14 AM                   ----- Message -----         From: Lafayette Dragon, MD         Sent: 03/13/2014   9:42 PM           To: Larina Bras, CMA            Please charge no show fee      ----- Message -----         From: Larina Bras, CMA         Sent: 03/13/2014   2:25 PM           To: Lafayette Dragon, MD            Patient no showed appointment with Dr Olevia Perches on 03/13/14. Dr Olevia Perches, do you want to charge no show fee?       ------

## 2014-05-09 ENCOUNTER — Ambulatory Visit (INDEPENDENT_AMBULATORY_CARE_PROVIDER_SITE_OTHER): Payer: BC Managed Care – PPO | Admitting: Podiatry

## 2014-05-09 ENCOUNTER — Ambulatory Visit (INDEPENDENT_AMBULATORY_CARE_PROVIDER_SITE_OTHER): Payer: BC Managed Care – PPO

## 2014-05-09 VITALS — BP 119/77 | HR 94 | Resp 16

## 2014-05-09 DIAGNOSIS — Z9889 Other specified postprocedural states: Secondary | ICD-10-CM

## 2014-05-09 DIAGNOSIS — M21611 Bunion of right foot: Secondary | ICD-10-CM

## 2014-05-09 DIAGNOSIS — M21619 Bunion of unspecified foot: Secondary | ICD-10-CM

## 2014-05-09 NOTE — Progress Notes (Signed)
She presents today 3 months status post [facility] bunion repair right as well as the fifth metatarsal osteotomy right she states it seems to be doing quite well still gets is willing a crunching sensation. Objective evaluation reveals vital signs are stable she is alert and oriented x3 she is great range of motion first metatarsal and fifth metatarsophalangeal joint right foot. There is no erythema edema saline is drainage or odor.  Assessment: Well-healing surgical foot.  Plan: Followup with me as needed.

## 2014-05-16 LAB — HM MAMMOGRAPHY

## 2014-05-21 ENCOUNTER — Ambulatory Visit (INDEPENDENT_AMBULATORY_CARE_PROVIDER_SITE_OTHER): Payer: BC Managed Care – PPO | Admitting: Family Medicine

## 2014-05-21 ENCOUNTER — Encounter: Payer: Self-pay | Admitting: Family Medicine

## 2014-05-21 VITALS — BP 100/72 | HR 99 | Temp 99.2°F | Ht 69.0 in | Wt 201.5 lb

## 2014-05-21 DIAGNOSIS — F308 Other manic episodes: Secondary | ICD-10-CM

## 2014-05-21 DIAGNOSIS — F411 Generalized anxiety disorder: Secondary | ICD-10-CM

## 2014-05-21 DIAGNOSIS — F319 Bipolar disorder, unspecified: Secondary | ICD-10-CM | POA: Insufficient documentation

## 2014-05-21 DIAGNOSIS — F419 Anxiety disorder, unspecified: Secondary | ICD-10-CM

## 2014-05-21 DIAGNOSIS — R635 Abnormal weight gain: Secondary | ICD-10-CM

## 2014-05-21 DIAGNOSIS — F309 Manic episode, unspecified: Secondary | ICD-10-CM

## 2014-05-21 HISTORY — DX: Bipolar disorder, unspecified: F31.9

## 2014-05-21 LAB — CBC WITH DIFFERENTIAL/PLATELET
BASOS ABS: 0 10*3/uL (ref 0.0–0.1)
Basophils Relative: 0.4 % (ref 0.0–3.0)
EOS ABS: 0.2 10*3/uL (ref 0.0–0.7)
Eosinophils Relative: 1.8 % (ref 0.0–5.0)
HEMATOCRIT: 41.3 % (ref 36.0–46.0)
HEMOGLOBIN: 13.8 g/dL (ref 12.0–15.0)
LYMPHS ABS: 2.5 10*3/uL (ref 0.7–4.0)
Lymphocytes Relative: 27 % (ref 12.0–46.0)
MCHC: 33.5 g/dL (ref 30.0–36.0)
MCV: 93.3 fl (ref 78.0–100.0)
MONO ABS: 0.4 10*3/uL (ref 0.1–1.0)
Monocytes Relative: 4.8 % (ref 3.0–12.0)
NEUTROS ABS: 6.1 10*3/uL (ref 1.4–7.7)
Neutrophils Relative %: 66 % (ref 43.0–77.0)
PLATELETS: 396 10*3/uL (ref 150.0–400.0)
RBC: 4.42 Mil/uL (ref 3.87–5.11)
RDW: 16.1 % — AB (ref 11.5–15.5)
WBC: 9.2 10*3/uL (ref 4.0–10.5)

## 2014-05-21 LAB — BASIC METABOLIC PANEL
BUN: 9 mg/dL (ref 6–23)
CALCIUM: 9 mg/dL (ref 8.4–10.5)
CO2: 22 meq/L (ref 19–32)
CREATININE: 0.8 mg/dL (ref 0.4–1.2)
Chloride: 103 mEq/L (ref 96–112)
GFR: 78.88 mL/min (ref >60.00)
Glucose, Bld: 92 mg/dL (ref 70–99)
Potassium: 3.8 mEq/L (ref 3.5–5.1)
SODIUM: 139 meq/L (ref 135–145)

## 2014-05-21 LAB — VITAMIN B12: Vitamin B-12: 223 pg/mL (ref 211–911)

## 2014-05-21 LAB — TSH: TSH: 1.08 u[IU]/mL (ref 0.35–4.50)

## 2014-05-21 MED ORDER — ALPRAZOLAM ER 0.5 MG PO TB24: 0.5000 mg | ORAL_TABLET | Freq: Every day | ORAL | Status: DC

## 2014-05-21 NOTE — Progress Notes (Signed)
Pre visit review using our clinic review tool, if applicable. No additional management support is needed unless otherwise documented below in the visit note. 

## 2014-05-21 NOTE — Progress Notes (Signed)
[place] [address] Phone: [phone] Fax: [phone]  Patient ID: Emily Moon MRN: [id number], DOB: 1961/03/24, 53 y.o. Date of Encounter: 05/21/2014  Primary Physician:  Arnette Norris, MD   Chief Complaint: Weight Gain and Fatigue   Subjective:   History of Present Illness:  Emily Moon is a 53 y.o. very pleasant female patient who presents with the following:  Multiple complaints: destabilized.  I was asked to urgently work in this patient for multiple different complaints, but for acute psychiatric difficulties. Starting 3 weeks ago, the patient started to have quite a bit of difficulty sleeping, despite taking her medication. She became somewhat more agitated, she felt more anxious and quite a bit different compared to normal. On 05/04/2014, the patient was given intramuscular Depo-Medrol injection of 80 mg, and she thinks that her symptoms started about this time.  One week before this, I gave her Augmentin 875 mg by mouth twice a day.  She is currently taking Cymbalta 60 mg daily, and Lamictal "2 tablets daily", Restoril 15-30 mg nightly. On 04/27/2014, I refilled her Ritalin that her neurologist originally placed her on, but our office had been prescribing it recently.   Ritalin: MS doctor, fatigue and weight loss at [facility].   Hydroxyzine, also using intermittently.   Bipolar, hosp once. 3 days.   Overall, she is not doing well. No SI, No HI, also more animated than normal.   She has concerns about weight gain.  Wt Readings from Last 3 Encounters:  05/21/14 201 lb 8 oz (91.4 kg)  05/04/14 196 lb (88.905 kg)  04/27/14 192 lb 12 oz (87.431 kg)     Past Medical History, Surgical History, Social History, Family History, Problem List, Medications, and Allergies have been reviewed and updated if relevant.  Review of Systems:  GEN: No acute illnesses, no fevers, chills. GI: No n/v/d, eating normally Pulm: No SOB  Otherwise, ROS is as  per the HPI.  Objective:   Physical Examination: BP 100/72  Pulse 99  Temp(Src) 99.2 F (37.3 C) (Oral)  Ht _0  (1.753 m)  Wt 201 lb 8 oz (91.4 kg)  BMI 29.74 kg/m2  LMP 04/27/2014   GEN: WDWN, NAD, Non-toxic, Alert & Oriented x 3 HEENT: Atraumatic, Normocephalic.  Ears and Nose: No external deformity. CV: RRR, no m/g/r PULM: Normal respiratory rate, no accessory muscle use. No wheezes, crackles or rhonchi EXTR: No clubbing/cyanosis/edema NEURO: Normal gait.  PSYCH: Talkative more than typical. Labile affect. Rare crying.   Laboratory and Imaging Data: Lipids: No results found for this basename: chol, trig, hdl, ldl, ldldirect, vldl, cholhdl   CBC: CBC Latest Ref Rng 07/25/2013  WBC 4.5 - 10.5 K/uL 8.0  Hemoglobin 12.0 - 15.0 g/dL 12.0  Hematocrit 36.0 - 46.0 % 35.7(L)  Platelets 150.0 - 400.0 K/uL 240.9    Basic Metabolic Panel:    Component Value Date/Time   NA 139 07/25/2013 1216   K 4.0 07/25/2013 1216   CL 105 07/25/2013 1216   CO2 30 07/25/2013 1216   BUN 10 07/25/2013 1216   CREATININE 0.7 07/25/2013 1216   GLUCOSE 65* 07/25/2013 1216   CALCIUM 8.3* 07/25/2013 1216   Hepatic Function Latest Ref Rng 07/25/2013  Total Protein 6.0 - 8.3 g/dL 6.4  Albumin 3.5 - 5.2 g/dL 3.7  AST 0 - 37 U/L 14  ALT 0 - 35 U/L 11  Alk Phosphatase 39 - 117 U/L 46  Total Bilirubin 0.3 - 1.2 mg/dL 0.3  Lab Results  Component Value Date   TSH 1.88 07/25/2013    Assessment & Plan:   Bipolar disorder, unspecified - Plan: Ambulatory referral to Psychiatry, CBC with Differential, Basic metabolic panel, TSH, Vitamin B12  Hypomania - Plan: Ambulatory referral to Psychiatry, CBC with Differential, Basic metabolic panel, TSH, Vitamin B12  Acute anxiety  Weight gain  >40 minutes spent in face to face time with patient, >50% spent in counselling or coordination of care: The patient appears significantly destabilized compared to prior encounters. Certainly either steroid-induction or  stimulant-induction is possible as a cause. Ongoing psychosocial stressors with death of her mother and upcoming wedding of a child likely contributing, as well.   The patient gives a good history for Bipolar 1 vs less likely Bipolar 2 with prior hospitalization x 1, and she is currently on Lamictal without a psychiatric home. She declined antipsychotic, which would be preferable. If patient agrees, I would start Zyprexa 2.5 - 5 mg BID if she is open next week at PCP f/u.   For now, Xanax XR 0.5 mg acutely, 1 po daily.  Patient is agreeable to Psychiatric care, and long-term additional assistance, mood-stabilization helpful.   New Prescriptions   ALPRAZOLAM (XANAX XR) 0.5 MG 24 HR TABLET    Take 1 tablet (0.5 mg total) by mouth daily.   Patient Instructions  Stop Ritalin. Completely  Take Temazepam 2 tablets at night for sleep  Start Melatonin 10 mg 1 hour before bed.      Orders Placed This Encounter  Procedures  . CBC with Differential  . Basic metabolic panel  . TSH  . Vitamin B12  . Ambulatory referral to Psychiatry   Follow-up: Return in about 1 week (around 05/28/2014) for DR. Deborra Medina. Unless noted above, the patient is to follow-up if symptoms worsen. Red flags were reviewed with the patient.  Signed,  Maud Deed. Jj Enyeart, MD, CAQ Sports Medicine   Discontinued Medications   AMOXICILLIN-CLAVULANATE (AUGMENTIN) 875-125 MG PER TABLET    Take 1 tablet by mouth 2 (two) times daily.   METHYLPHENIDATE (RITALIN) 10 MG TABLET    Take 1 tablet (10 mg total) by mouth as needed.   Current Medications at Discharge:   Medication List       This list is accurate as of: 05/21/14  1:49 PM.  Always use your most recent med list.               ALPRAZolam 0.5 MG 24 hr tablet  Commonly known as:  XANAX XR  Take 1 tablet (0.5 mg total) by mouth daily.     aspirin 325 MG tablet  Take 325 mg by mouth as needed.     BIOTIN PO  Take 600 mg by mouth daily.     calcium carbonate 500  MG chewable tablet  Commonly known as:  TUMS - dosed in mg elemental calcium  Chew 3 tablets by mouth daily.     cetirizine 10 MG tablet  Commonly known as:  ZYRTEC  Take 10 mg by mouth daily.     desonide 0.05 % cream  Commonly known as:  DESOWEN     diclofenac sodium 1 % Gel  Commonly known as:  VOLTAREN  Apply 4 g topically 4 (four) times daily.     DULoxetine 60 MG capsule  Commonly known as:  CYMBALTA  TAKE ONE CAPSULE BY MOUTH ONE TIME DAILY     erythromycin ophthalmic ointment  Place 1 application into the right eye 3 (  three) times daily.     hydrOXYzine 25 MG tablet  Commonly known as:  ATARAX/VISTARIL  TAKE TWO TABLETS BY MOUTH NIGHTLY AT BEDTIME     lamoTRIgine 150 MG tablet  Commonly known as:  LAMICTAL  Take two tablets by mouth daily     levalbuterol 45 MCG/ACT inhaler  Commonly known as:  XOPENEX HFA  Inhale 1-2 puffs into the lungs every 4 (four) hours as needed.     Lysine 1000 MG Tabs  Take 4,000 mg by mouth daily.     nicotine 21 mg/24hr patch  Commonly known as:  NICODERM CQ - dosed in mg/24 hours  Place 21 mg onto the skin daily.     polyethylene glycol powder powder  Commonly known as:  GLYCOLAX/MIRALAX  Take 17 g by mouth 2 (two) times daily as needed.     temazepam 30 MG capsule  Commonly known as:  RESTORIL  TAKE ONE TO TWO CAPSULES BY MOUTH NIGHTLY AT BEDTIME AS NEEDED     tiZANidine 4 MG tablet  Commonly known as:  ZANAFLEX  TAKE ONE TABLET BY MOUTH EVERY SIX HOURS AS NEEDED     VITAMIN D-3 PO  Take 10,000 Units by mouth daily.

## 2014-05-21 NOTE — Patient Instructions (Addendum)
Stop Ritalin. Completely  Take Temazepam 2 tablets at night for sleep  Start Melatonin 10 mg 1 hour before bed.

## 2014-05-22 ENCOUNTER — Other Ambulatory Visit: Payer: Self-pay | Admitting: Family Medicine

## 2014-05-23 NOTE — Telephone Encounter (Signed)
Pt requesting medication refill. Last f/u appt 07/2014 with appt sched 05/2014. pls advise

## 2014-05-24 ENCOUNTER — Other Ambulatory Visit: Payer: Self-pay

## 2014-05-24 MED ORDER — HYDROCODONE-ACETAMINOPHEN 10-325 MG PO TABS: 1.0000 | ORAL_TABLET | Freq: Four times a day (QID) | ORAL | Status: DC | PRN

## 2014-05-24 NOTE — Telephone Encounter (Signed)
Spoke to pt and informed her Rx has been called in to requested pharmacy 

## 2014-05-24 NOTE — Telephone Encounter (Signed)
Pt wants to try the hydrocodone apap as an as needed med instead of the oxycodone 15 mg. Pt has appt with Dr Deborra Medina on 05/28/14 but pt would like to get hydrocodone apap picked up on 05/25/14.Please advise.

## 2014-05-24 NOTE — Telephone Encounter (Signed)
Spoke to pt and informed her Rx is available for pick up at the front desk 

## 2014-05-28 ENCOUNTER — Encounter: Payer: Self-pay | Admitting: Family Medicine

## 2014-05-28 ENCOUNTER — Ambulatory Visit (INDEPENDENT_AMBULATORY_CARE_PROVIDER_SITE_OTHER): Payer: BC Managed Care – PPO | Admitting: Family Medicine

## 2014-05-28 VITALS — BP 118/76 | HR 96 | Temp 99.0°F | Ht 69.0 in | Wt 201.5 lb

## 2014-05-28 DIAGNOSIS — F319 Bipolar disorder, unspecified: Secondary | ICD-10-CM

## 2014-05-28 MED ORDER — METHYLPHENIDATE HCL 10 MG PO TABS: 10.0000 mg | ORAL_TABLET | Freq: Two times a day (BID) | ORAL | Status: DC

## 2014-05-28 NOTE — Progress Notes (Signed)
Subjective:   Patient ID: Emily Moon, female    DOB: [birth year], 53 y.o.   MRN: [id number]  Shanay Woolman is a pleasant 53 y.o. year old female who presents to clinic today with Follow-up  on 05/28/2014  HPI:  Here for 1 week follow up.  Saw Dr. Lorelei Pont last week. Note reviewed. Was having acute psychiatric difficulties- she felt started after IM depo medrol injection. Symptoms included difficulty sleeping, more difficulty concentrating, fluctuations in mood.  Quite a few stressors going on- son getting married in August and she is very focused on losing weight and "getting into that dress!"  Burying her mom's ashes soon who died a few months ago.   Wt Readings from Last 3 Encounters:  05/28/14 201 lb 8 oz (91.4 kg)  05/21/14 201 lb 8 oz (91.4 kg)  05/04/14 196 lb (88.905 kg)   Dr.Copland felt she was hypomanic and appropriately referred to psychiatry which she has resisted in past.  Appointment made with psychiatrist for end of August.  He advised her to STOP taking Ritalin as it could worsen her mania.  She did not comply with this suggestion bc per pt, neurologist told her she needed it for alertness.  Rx was given for xanax XR which she would not take out of fears of weight gain.  Current Outpatient Prescriptions on File Prior to Visit  Medication Sig Dispense Refill  . aspirin 325 MG tablet Take 325 mg by mouth as needed.      Marland Kitchen BIOTIN PO Take 600 mg by mouth daily.      . calcium carbonate (TUMS - DOSED IN MG ELEMENTAL CALCIUM) 500 MG chewable tablet Chew 3 tablets by mouth daily.      . cetirizine (ZYRTEC) 10 MG tablet Take 10 mg by mouth daily.      . Cholecalciferol (VITAMIN D-3 PO) Take 10,000 Units by mouth daily.       Marland Kitchen desonide (DESOWEN) 0.05 % cream       . diclofenac sodium (VOLTAREN) 1 % GEL Apply 4 g topically 4 (four) times daily.  500 g  5  . DULoxetine (CYMBALTA) 60 MG capsule TAKE ONE CAPSULE BY MOUTH ONE TIME DAILY   30 capsule  1  . erythromycin  ophthalmic ointment Place 1 application into the right eye 3 (three) times daily.      Marland Kitchen HYDROcodone-acetaminophen (NORCO) 10-325 MG per tablet Take 1 tablet by mouth every 6 (six) hours as needed.  90 tablet  0  . hydrOXYzine (ATARAX/VISTARIL) 25 MG tablet TAKE TWO TABLETS BY MOUTH NIGHTLY AT BEDTIME   60 tablet  5  . lamoTRIgine (LAMICTAL) 150 MG tablet Take two tablets by mouth daily  180 tablet  3  . levalbuterol (XOPENEX HFA) 45 MCG/ACT inhaler Inhale 1-2 puffs into the lungs every 4 (four) hours as needed.  1 Inhaler  0  . Lysine 1000 MG TABS Take 4,000 mg by mouth daily.       . nicotine (NICODERM CQ - DOSED IN MG/24 HOURS) 21 mg/24hr patch Place 21 mg onto the skin daily.      . polyethylene glycol powder (GLYCOLAX/MIRALAX) powder Take 17 g by mouth 2 (two) times daily as needed.  3350 g  1  . temazepam (RESTORIL) 30 MG capsule TAKE ONE TO TWO CAPSULES BY MOUTH NIGHTLY AT BEDTIME AS NEEDED   60 capsule  0  . tiZANidine (ZANAFLEX) 4 MG tablet TAKE ONE TABLET BY MOUTH EVERY SIX HOURS AS NEEDED  60 tablet  0  . ALPRAZolam (XANAX XR) 0.5 MG 24 hr tablet Take 1 tablet (0.5 mg total) by mouth daily.  30 tablet  0   No current facility-administered medications on file prior to visit.    Allergies  Allergen Reactions  . Tylenol [Acetaminophen] Shortness Of Breath and Swelling  . Ultram [Tramadol Hcl]   . Aripiprazole   . Cefaclor   . Diclofenac Sodium   . Ibuprofen   . Naproxen Sodium     Past Medical History  Diagnosis Date  . Bipolar disorder   . Multiple sclerosis   . Depression   . GERD (gastroesophageal reflux disease)   . Nephrolithiasis   . Osteoporosis     osteoarthritis  . Fibromyalgia syndrome   . 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 Bipolar disorder, unspecified 05/21/2014    Past Surgical History  Procedure Laterality Date  . Tonsillectomy    . Joint replacement Left 2013    hip replacement    Family History  Problem Relation Age of Onset  . Cancer  Father     History   Social History  . Marital Status: Divorced    Spouse Name: N/A    Number of Children: 1  . Years of Education: N/A   Occupational History  . 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 Smoking status: Former Smoker -- 0.50 packs/day for 29 years    Types: Cigarettes  . Smokeless tobacco: Never Used     Comment: recenty 04/26/2014  . Alcohol Use: No  . Drug Use: No  . Sexual Activity: No   Other Topics Concern  . Not on file   Social History Narrative  . No narrative on file   The PMH, PSH, Social History, Family History, Medications, and allergies have been reviewed in [facility], and have been updated if relevant.    Review of Systems See HPI No SI or HI Sleeping better Still having difficulty focusing    Objective:    BP 118/76  Pulse 96  Temp(Src) 99 F (37.2 C) (Oral)  Ht 5\' 9"  (1.753 m)  Wt 201 lb 8 oz (91.4 kg)  BMI 29.74 kg/m2  LMP 03/26/2014   Physical Exam  Gen:  Alert, pleasant, NAD Psych:  Difficulty staying on one topic, speech is not pressured, good eye contact (baseline affect)      Assessment & Plan:   Bipolar disorder, unspecified No Follow-up on file.

## 2014-05-28 NOTE — Assessment & Plan Note (Signed)
>  25 minutes spent in face to face time with patient, >50% spent in counselling or coordination of care Seems at baseline today. Agree with psychiatry for med management- I have been urging her to get re established with psychiatry for a long time.  Previously was a cost issue. She has continued Ritalin- I have added this back on her list but told her once psych involved, I will no longer refill. She is contacted for safety and will update me or go to behavioral health if symptoms deteriorate again.

## 2014-05-28 NOTE — Patient Instructions (Signed)
Good to see you. Congratulations on your son's wedding.  Please update me and keep your appointment with psychiatry.

## 2014-05-28 NOTE — Progress Notes (Signed)
Pre visit review using our clinic review tool, if applicable. No additional management support is needed unless otherwise documented below in the visit note. 

## 2014-06-21 ENCOUNTER — Ambulatory Visit (INDEPENDENT_AMBULATORY_CARE_PROVIDER_SITE_OTHER): Payer: BC Managed Care – PPO | Admitting: Family Medicine

## 2014-06-21 ENCOUNTER — Encounter: Payer: Self-pay | Admitting: Family Medicine

## 2014-06-21 ENCOUNTER — Other Ambulatory Visit: Payer: Self-pay | Admitting: Family Medicine

## 2014-06-21 ENCOUNTER — Other Ambulatory Visit (HOSPITAL_COMMUNITY)
Admission: RE | Admit: 2014-06-21 | Discharge: 2014-06-21 | Disposition: A | Discharge status: Home / Self Care | Payer: BC Managed Care – PPO | Source: Ambulatory Visit | Attending: Family Medicine | Admitting: Family Medicine

## 2014-06-21 VITALS — BP 126/78 | HR 103 | Temp 99.4°F | Wt 200.5 lb

## 2014-06-21 DIAGNOSIS — Z01419 Encounter for gynecological examination (general) (routine) without abnormal findings: Secondary | ICD-10-CM | POA: Insufficient documentation

## 2014-06-21 DIAGNOSIS — N926 Irregular menstruation, unspecified: Secondary | ICD-10-CM

## 2014-06-21 DIAGNOSIS — Z1151 Encounter for screening for human papillomavirus (HPV): Secondary | ICD-10-CM | POA: Insufficient documentation

## 2014-06-21 DIAGNOSIS — E669 Obesity, unspecified: Secondary | ICD-10-CM

## 2014-06-21 DIAGNOSIS — R635 Abnormal weight gain: Secondary | ICD-10-CM | POA: Insufficient documentation

## 2014-06-21 DIAGNOSIS — Z1231 Encounter for screening mammogram for malignant neoplasm of breast: Secondary | ICD-10-CM

## 2014-06-21 DIAGNOSIS — F319 Bipolar disorder, unspecified: Secondary | ICD-10-CM

## 2014-06-21 DIAGNOSIS — N939 Abnormal uterine and vaginal bleeding, unspecified: Secondary | ICD-10-CM

## 2014-06-21 DIAGNOSIS — N951 Menopausal and female climacteric states: Secondary | ICD-10-CM

## 2014-06-21 HISTORY — DX: Abnormal weight gain: R63.5

## 2014-06-21 MED ORDER — HYDROCODONE-ACETAMINOPHEN 10-325 MG PO TABS: 1.0000 | ORAL_TABLET | Freq: Four times a day (QID) | ORAL | Status: DC | PRN

## 2014-06-21 NOTE — Addendum Note (Signed)
Addended by: Modena Nunnery on: 06/21/2014 10:51 AM   Modules accepted: Orders

## 2014-06-21 NOTE — Progress Notes (Signed)
Pre visit review using our clinic review tool, if applicable. No additional management support is needed unless otherwise documented below in the visit note. 

## 2014-06-21 NOTE — Assessment & Plan Note (Addendum)
Pap smear today. Mammogram ordered.  She will schedule.

## 2014-06-21 NOTE — Patient Instructions (Signed)
Good to see you. Have a great time at the wedding. Please call to set up your mammogram and call me when you are ready for your pelvic ultrasound/biopsy.

## 2014-06-21 NOTE — Assessment & Plan Note (Addendum)
Explained again to Ms. Segundo that I cannot prescribe her appetite suppressants. Follow up with psychiatry on 8/25 as scheduled- likely has some component of binge eating.  Also is perimenopausal and having some fluctuations in hormones. I suggested pelvic US and EMB but she is declining.

## 2014-06-21 NOTE — Progress Notes (Addendum)
Subjective:   Patient ID: Emily Moon, female    DOB: 12/20/1960, 53 y.o.   MRN: [id number]  Emily Moon is a pleasant 53 y.o. year old female with complicated history well known to me, who presents to clinic today with Weight Gain and Abdominal Cramping  on 06/21/2014  HPI:  Weight gain- this is something she has been very concerned about.  Last month, saw Dr. Lorelei Pont for acute psychiatric decompensation-she felt started after IM depo medrol injection. Symptoms included difficulty sleeping, more difficulty concentrating, fluctuations in mood.  Quite a few stressors going on- son getting married this month and she has been very focused on losing weight and "getting into that dress!"     Wt Readings from Last 3 Encounters:  06/21/14 200 lb 8 oz (90.946 kg)  05/28/14 201 lb 8 oz (91.4 kg)  05/21/14 201 lb 8 oz (91.4 kg)   Dr.Copland felt she was hypomanic and appropriately referred to psychiatry which she has resisted in past.  Appointment made with psychiatrist for end of this month.     Menstrual irregular-  Past year- periods have been irregular- skipped period in June.  Not too heavy but more cramping, bloating and breast tenderness. Has not had a mammogram since she was 53 yo. Last pap smear- done by me- 02/05/2011.  Lab Results  Component Value Date   TSH 1.08 05/21/2014   Lab Results  Component Value Date   WBC 9.2 05/21/2014   HGB 13.8 05/21/2014   HCT 41.3 05/21/2014   MCV 93.3 05/21/2014   PLT 396.0 05/21/2014    Current Outpatient Prescriptions on File Prior to Visit  Medication Sig Dispense Refill  . ALPRAZolam (XANAX XR) 0.5 MG 24 hr tablet Take 1 tablet (0.5 mg total) by mouth daily.  30 tablet  0  . aspirin 325 MG tablet Take 325 mg by mouth as needed.      Marland Kitchen BIOTIN PO Take 600 mg by mouth daily.      . calcium carbonate (TUMS - DOSED IN MG ELEMENTAL CALCIUM) 500 MG chewable tablet Chew 3 tablets by mouth daily.      . cetirizine (ZYRTEC) 10 MG tablet Take 10 mg  by mouth daily.      . Cholecalciferol (VITAMIN D-3 PO) Take 10,000 Units by mouth daily.       Marland Kitchen desonide (DESOWEN) 0.05 % cream       . diclofenac sodium (VOLTAREN) 1 % GEL Apply 4 g topically 4 (four) times daily.  500 g  5  . DULoxetine (CYMBALTA) 60 MG capsule TAKE ONE CAPSULE BY MOUTH ONE TIME DAILY   30 capsule  1  . HYDROcodone-acetaminophen (NORCO) 10-325 MG per tablet Take 1 tablet by mouth every 6 (six) hours as needed.  90 tablet  0  . hydrOXYzine (ATARAX/VISTARIL) 25 MG tablet TAKE TWO TABLETS BY MOUTH NIGHTLY AT BEDTIME   60 tablet  5  . lamoTRIgine (LAMICTAL) 150 MG tablet Take two tablets by mouth daily  180 tablet  3  . levalbuterol (XOPENEX HFA) 45 MCG/ACT inhaler Inhale 1-2 puffs into the lungs every 4 (four) hours as needed.  1 Inhaler  0  . Lysine 1000 MG TABS Take 4,000 mg by mouth daily.       . methylphenidate (RITALIN) 10 MG tablet Take 1 tablet (10 mg total) by mouth 2 (two) times daily with breakfast and lunch.  60 tablet  0  . nicotine (NICODERM CQ - DOSED IN MG/24  HOURS) 21 mg/24hr patch Place 21 mg onto the skin daily.      . polyethylene glycol powder (GLYCOLAX/MIRALAX) powder Take 17 g by mouth 2 (two) times daily as needed.  3350 g  1  . temazepam (RESTORIL) 30 MG capsule TAKE ONE TO TWO CAPSULES BY MOUTH NIGHTLY AT BEDTIME AS NEEDED   60 capsule  0  . tiZANidine (ZANAFLEX) 4 MG tablet TAKE ONE TABLET BY MOUTH EVERY SIX HOURS AS NEEDED   60 tablet  0   No current facility-administered medications on file prior to visit.    Allergies  Allergen Reactions  . Tylenol [Acetaminophen] Shortness Of Breath and Swelling  . Ultram [Tramadol Hcl]   . Aripiprazole   . Cefaclor   . Diclofenac Sodium   . Ibuprofen   . Naproxen Sodium     Past Medical History  Diagnosis Date  . Bipolar disorder   . Multiple sclerosis   . Depression   . GERD (gastroesophageal reflux disease)   . Nephrolithiasis   . Osteoporosis     osteoarthritis  . Fibromyalgia syndrome   .  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 Bipolar disorder, unspecified 05/21/2014    Past Surgical History  Procedure Laterality Date  . Tonsillectomy    . Joint replacement Left 2013    hip replacement    Family History  Problem Relation Age of Onset  . Cancer Father     History   Social History  . Marital Status: Divorced    Spouse Name: N/A    Number of Children: 1  . Years of Education: N/A   Occupational History  . 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 Smoking status: Former Smoker -- 0.50 packs/day for 29 years    Types: Cigarettes  . Smokeless tobacco: Never Used     Comment: recenty 04/26/2014  . Alcohol Use: No  . Drug Use: No  . Sexual Activity: No   Other Topics Concern  . Not on file   Social History Narrative  . No narrative on file   The PMH, PSH, Social History, Family History, Medications, and allergies have been reviewed in [facility], and have been updated if relevant.    Review of Systems See HPI No SI or HI Sleeping better Still having difficulty focusing    Objective:    BP 126/78  Pulse 103  Temp(Src) 99.4 F (37.4 C) (Oral)  Wt 200 lb 8 oz (90.946 kg)  SpO2 98%  LMP 06/12/2014   Physical Exam  Gen:  Alert, pleasant, NAD Psych:  Difficulty staying on one topic, speech is not pressured, good eye contact (baseline affect) Breasts:  No mass, nodules, thickening, tenderness, bulging, retraction, inflamation, nipple discharge or skin changes noted.   Abdomen:  Bowel sounds positive,abdomen soft and non-tender without masses, organomegaly or hernias noted. Rectal:  no external abnormalities.   Genitalia:  Pelvic Exam:        External: normal female genitalia without lesions or masses        Vagina: normal without lesions or masses        Cervix: normal without lesions or masses        Adnexa: normal bimanual exam without masses or fullness        Uterus: normal by palpation        Pap smear:  performed Msk:  No deformity or scoliosis noted of thoracic or lumbar spine.   Extremities:  No clubbing, cyanosis,  edema, or deformity noted with normal full range of motion of all joints.   Neurologic:  alert & oriented X3 and gait normal.   Skin:  Intact without suspicious lesions or rashes Cervical Nodes:  No lymphadenopathy noted Axillary Nodes:  No palpable lymphadenopathy Psych:  Cognition and judgment appear intact. Alert and cooperative with normal attention span and concentration. No apparent delusions, illusions, hallucinations      Assessment & Plan:   Weight gain  Menstrual abnormality  Obesity  Perimenopausal vasomotor symptoms  Bipolar disorder, unspecified No Follow-up on file.

## 2014-06-25 LAB — CYTOLOGY - PAP

## 2014-06-26 ENCOUNTER — Encounter: Payer: Self-pay | Admitting: *Deleted

## 2014-07-05 ENCOUNTER — Other Ambulatory Visit: Payer: Self-pay | Admitting: Family Medicine

## 2014-07-05 NOTE — Telephone Encounter (Signed)
Pt left v/m; pt requesting refill temazepam to [facility]; pt has appt to see psychiatrist but will be out of temazepam prior to seeing psychiatrist. Pt will be out of med on 07/07/14. Pt request cb.

## 2014-07-06 ENCOUNTER — Telehealth: Payer: Self-pay

## 2014-07-06 DIAGNOSIS — N926 Irregular menstruation, unspecified: Secondary | ICD-10-CM

## 2014-07-06 NOTE — Telephone Encounter (Signed)
Rx called in to requested pharmacy 

## 2014-07-06 NOTE — Telephone Encounter (Signed)
Pt left v/m when pt got back into town pt was to call requesting order for [place] of pelvis. Pt will wait to hear from pt care coodinator.

## 2014-07-07 NOTE — Telephone Encounter (Signed)
Referral placed.

## 2014-07-10 ENCOUNTER — Telehealth: Payer: Self-pay | Admitting: Family Medicine

## 2014-07-10 NOTE — Telephone Encounter (Signed)
Dr.Kapur called and asked that you call her back as soon as possible to discuss patient.  Dr.Kapur said you can call her back at her office number [phone] or on her cell phone after hours.  I'll put Dr.Kapur's cell phone number in a staff message to you.

## 2014-07-11 ENCOUNTER — Telehealth: Payer: Self-pay | Admitting: Family Medicine

## 2014-07-11 NOTE — Telephone Encounter (Signed)
Returned patient's call. She felt she did have a good connection with psychiatrist and wants referral to another psychiatrist. After our discussion, she agreed to go see her again and perhaps express the issues she had with her.  She will update me.  Spoke with her about being referred to pain management because Dr. Nicolasa Ducking expressed concern that she admitted overuse of narcotics. She refuses referral.  I explained I can no longer prescribe narcotics to her.  She expressed understanding.

## 2014-07-11 NOTE — Telephone Encounter (Signed)
Pt went and saw dr Nicolasa Ducking and pt stated she did not get along with her.  Pt stated she was not happy with her at all.  She wants to see some else  She would like talk to someone about the appointment and what went on at the appointment. Pt stated she is going to see dr Ardeen Jourdain on 9/2 for her first appointment

## 2014-07-11 NOTE — Telephone Encounter (Signed)
Called Dr. Nicolasa Ducking and discussed her concerns.  She stated that pt admitted she is overusing narcotics.

## 2014-07-12 ENCOUNTER — Telehealth: Payer: Self-pay | Admitting: Family Medicine

## 2014-07-12 ENCOUNTER — Ambulatory Visit
Admission: RE | Admit: 2014-07-12 | Discharge: 2014-07-12 | Disposition: A | Discharge status: Home / Self Care | Payer: BC Managed Care – PPO | Source: Ambulatory Visit | Attending: Family Medicine | Admitting: Family Medicine

## 2014-07-12 DIAGNOSIS — N926 Irregular menstruation, unspecified: Secondary | ICD-10-CM

## 2014-07-12 IMAGING — US US TRANSVAGINAL NON-OB
1 series · 13 of 25 positions shown · non-contrast
Comparison: None

CLINICAL DATA: Menstrual irregularity.

EXAM:
TRANSABDOMINAL AND TRANSVAGINAL ULTRASOUND OF PELVIS
TECHNIQUE: Both transabdominal and transvaginal ultrasound examinations of the
pelvis were performed. Transabdominal technique was performed for
global imaging of the pelvis including uterus, ovaries, adnexal
regions, and pelvic cul-de-sac. It was necessary to proceed with
endovaginal exam following the transabdominal exam to visualize the
uterus, endometrium and ovaries to better advantage..

[Series 1: us transvaginal non-ob · 0.26mm/px · 13 of 55 slices shown]
[im 1/55]
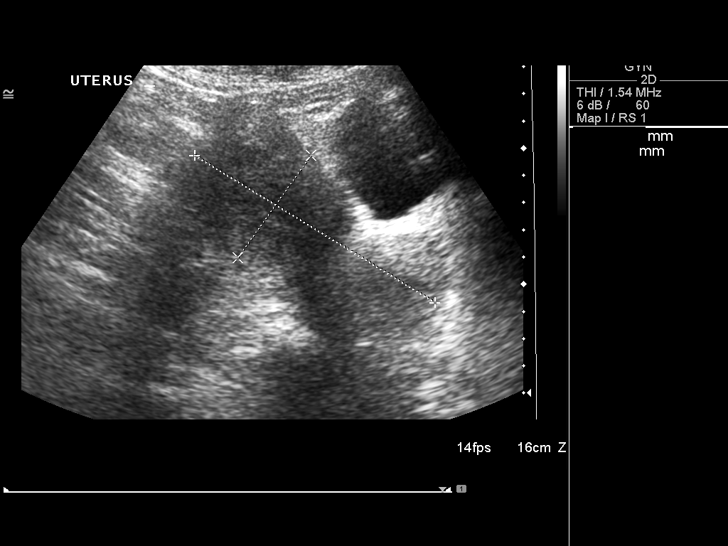
[im 5/55]
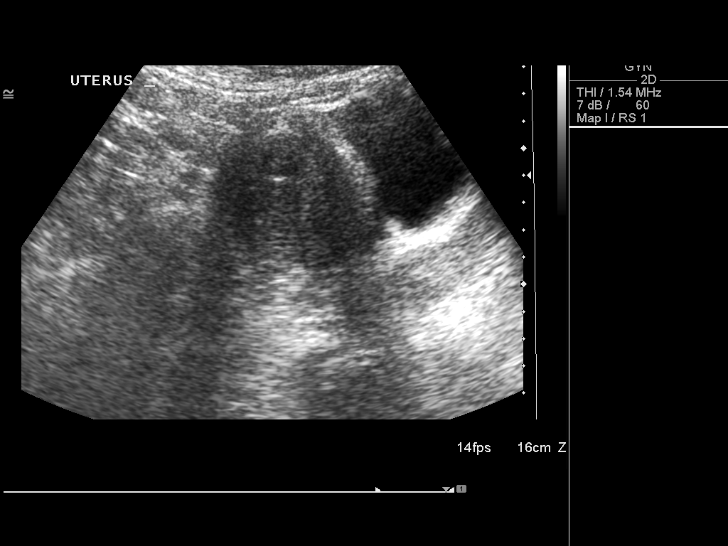
[im 10/55]
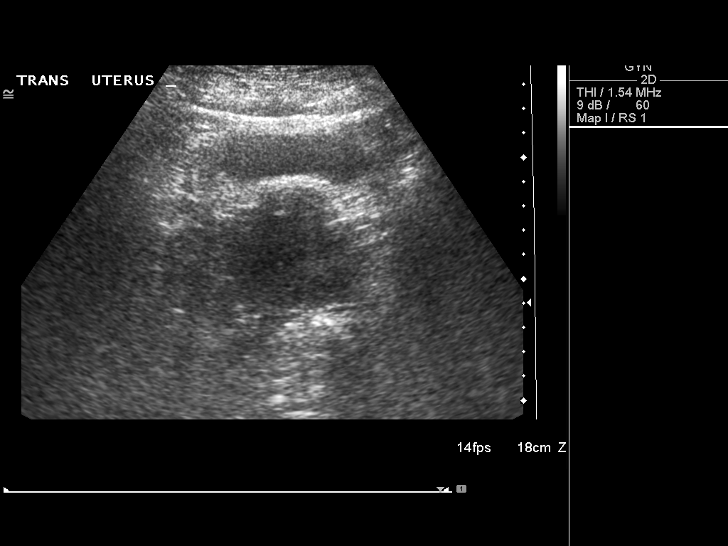
[im 14/55]
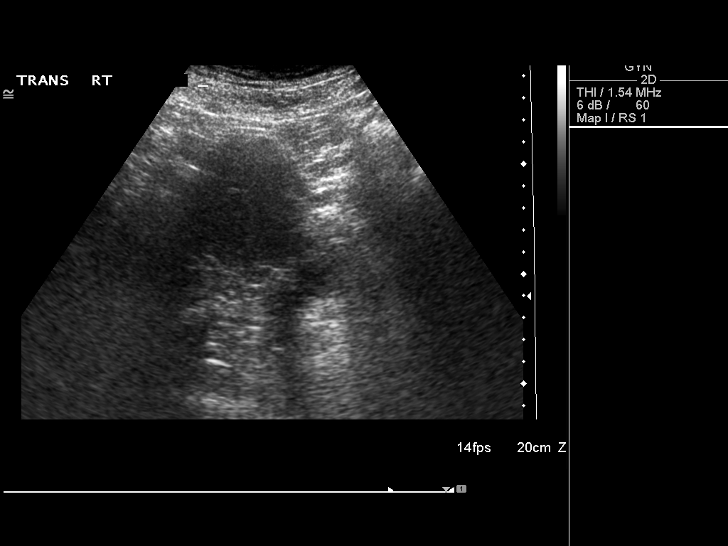
[im 19/55]
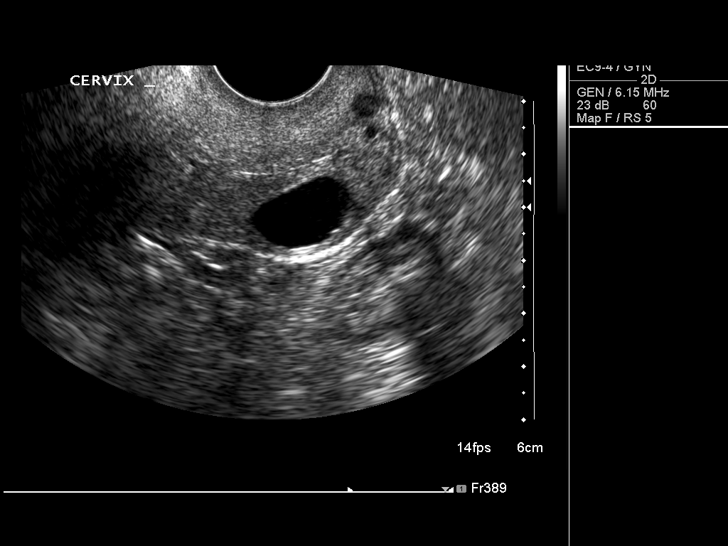
[im 23/55]
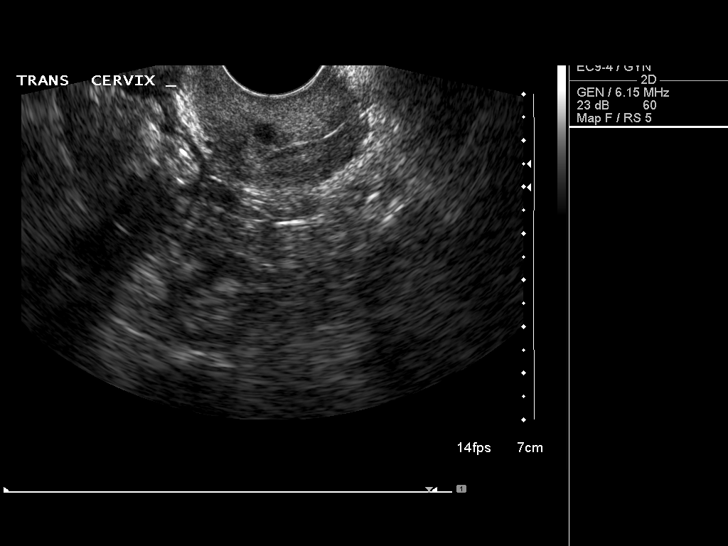
[im 28/55]
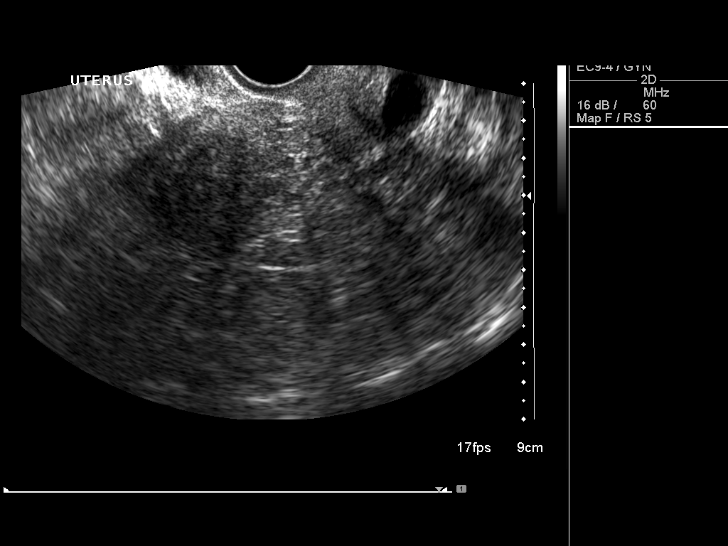
[im 32/55]
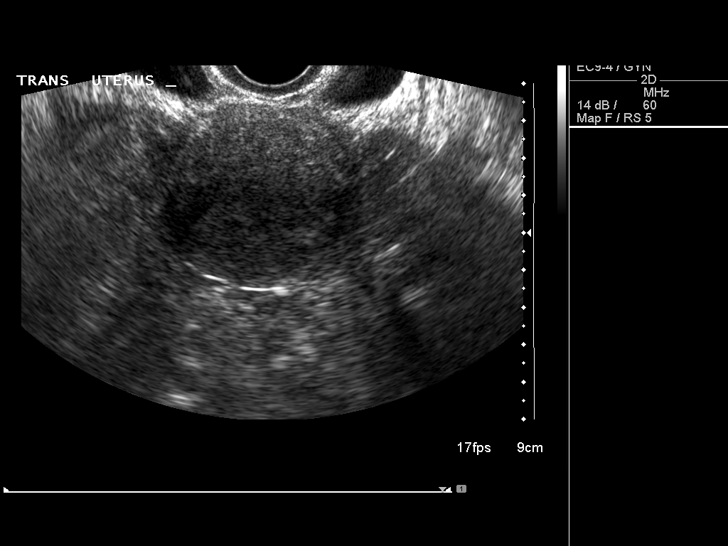
[im 37/55]
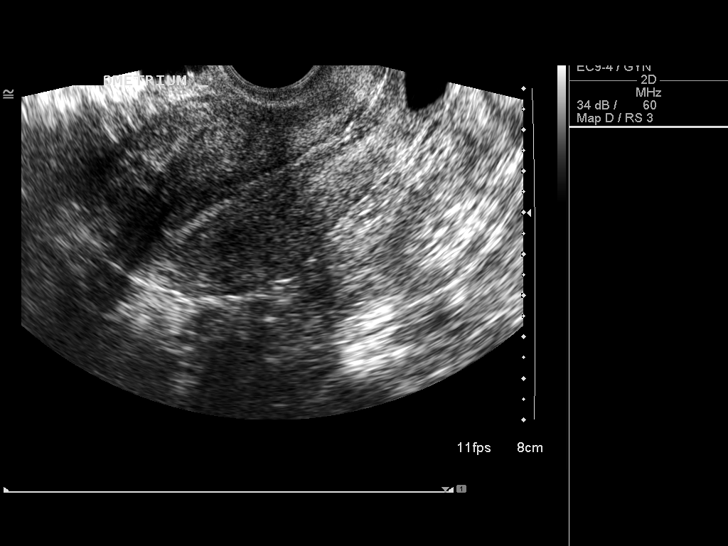
[im 41/55]
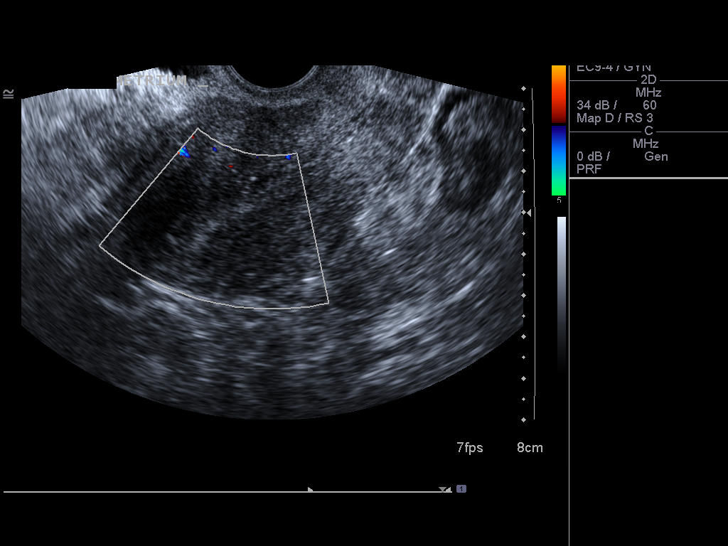
[im 46/55]
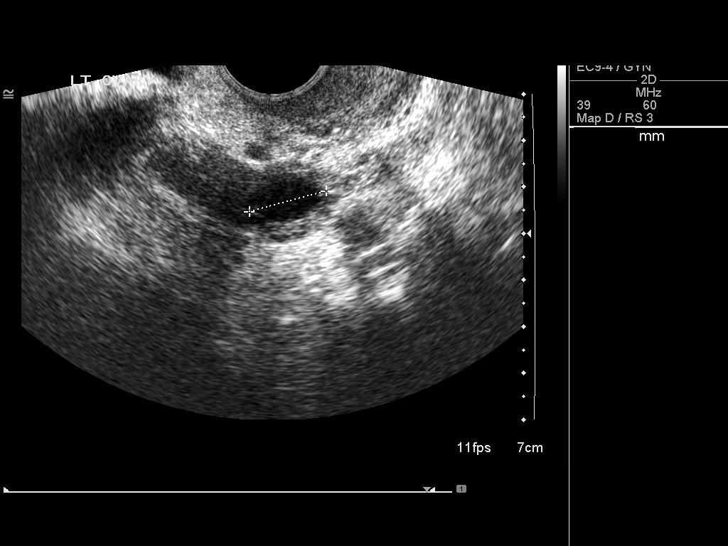
[im 50/55]
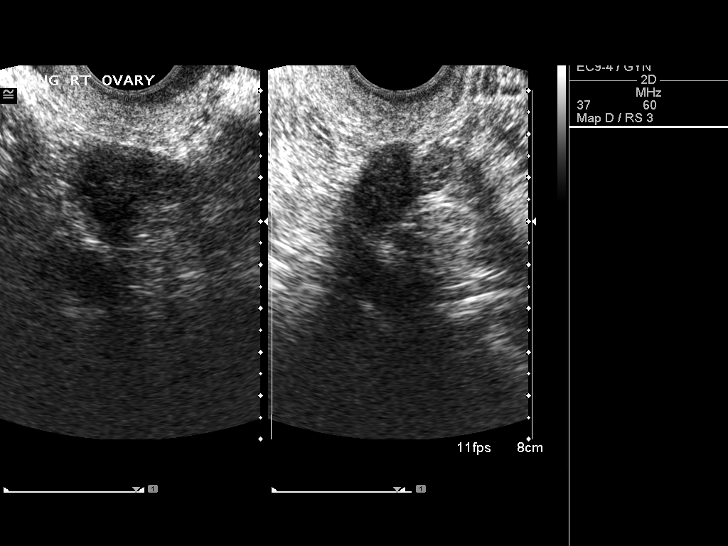
[im 55/55]
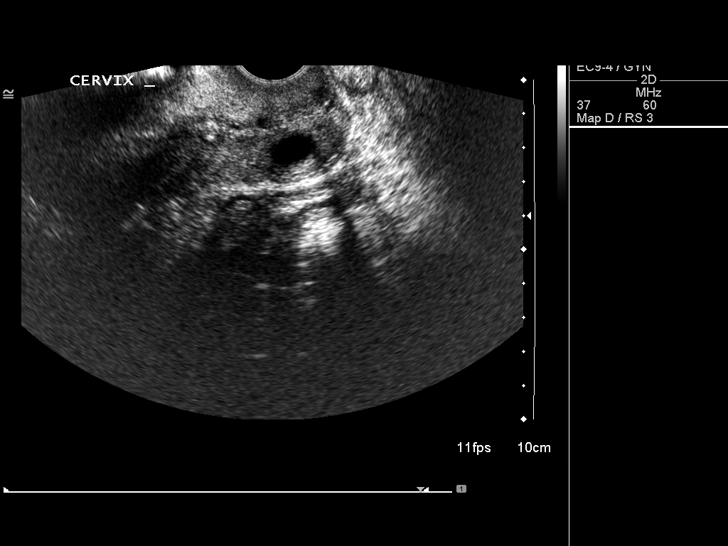

[13 of 25 positions shown; findings below may reference images not displayed]

FINDINGS: Uterus

Measurements: 10 cm x 5.2 cm x 5.2 cm. Small fibroid arises from the
anterior uterine fundus measuring 18 mm in greatest dimension. The
cervix shows a large nabothian cyst measuring 2 cm in long axis. No
other abnormalities.

Endometrium

Thickness: 7.5 mm.  No focal abnormality visualized.

Right ovary

Measurements: 2.7 cm x 2.3 cm x 1.9 cm. Normal appearance/no adnexal
mass.

Left ovary

Measurements: 3.9 cm x 1.7 cm x 2.4 cm. 17 mm dominant physiologic
cyst. No adnexal masses.

Other findings

No free fluid.
IMPRESSION: 1. No acute findings.
2. Small uterine fibroid. No other uterine abnormality. Large
cervical nabothian cyst.
3. Normal ovaries and adnexa.

## 2014-07-12 NOTE — Telephone Encounter (Signed)
Caller: Sadeen/Patient; Phone: [phone]; Reason for Call: Patient was put on Saphris by the psychiatrist she is currently seeing.  She is taking a half tablet per day.  She reports she had vomiting, muscle twitching, and reports that it felt like she was "on marijuana." Patient reports she has been unable to talk to the pyschiatrist regarding the side effects. And that she does not like the psychiatrist at all.  Please contact her with further instructions.

## 2014-07-12 NOTE — Telephone Encounter (Signed)
Spoke to pt and advised per Dr Deborra Medina. Pt verbally expressed understanding. Pt states that she is going to d/c med as she is unable to deal with s/s and will continue attempting to contact Dr Nicolasa Ducking

## 2014-07-12 NOTE — Telephone Encounter (Signed)
Unfortunately as I told her yesterday on the phone, I do not prescribe Saphris and therefore it is outside my scope of practice to manage this.  I am sorry she is not feeling well.  Side effects are common with most psychiatric meds and they typically improve within a few days.  Please advise her to continuing trying to contact Dr. Nicolasa Ducking.

## 2014-07-13 ENCOUNTER — Other Ambulatory Visit: Payer: Self-pay | Admitting: Family Medicine

## 2014-07-13 DIAGNOSIS — R102 Pelvic and perineal pain: Secondary | ICD-10-CM

## 2014-07-18 ENCOUNTER — Ambulatory Visit (INDEPENDENT_AMBULATORY_CARE_PROVIDER_SITE_OTHER): Payer: BC Managed Care – PPO | Admitting: Psychology

## 2014-07-18 ENCOUNTER — Telehealth: Payer: Self-pay | Admitting: Family Medicine

## 2014-07-18 DIAGNOSIS — F3189 Other bipolar disorder: Secondary | ICD-10-CM

## 2014-07-18 NOTE — Telephone Encounter (Signed)
Pt said that the psychologist that Dr. Deborra Medina referred her to (she does not know the name-looks like it may be Dr. Nicolasa Ducking) wants her to have a BMP, Hgb A1C, and a Lipid panel drawn at [place].  She thought that she just had all of this lab work drawn recently at our office and did not want to have it drawn again.  I informed her that the only labs we drew recently from that list was a BMP.  She requests that Dr. Deborra Medina put in an order for the Hgb A1C and Lipid panel so that she can have them drawn here this afternoon during her appt with Dr. Rexene Edison.

## 2014-07-18 NOTE — Telephone Encounter (Signed)
We should have informed her that we cannot draw labs that other physicians are ordering unfortunately.  I can order them as screening labs and forward her the results if she would like that- ok to place orders if she agrees to this.

## 2014-07-18 NOTE — Telephone Encounter (Signed)
Spoke to pt and advised per Dr Aron; pt verbally expressed understanding.  

## 2014-07-19 ENCOUNTER — Telehealth: Payer: Self-pay

## 2014-07-19 NOTE — Telephone Encounter (Signed)
I would suggest doing what OBGYN advises given duration of her symptoms. She is aware that I unfortunately cannot prescribe narcotics to her at this point.

## 2014-07-19 NOTE — Telephone Encounter (Signed)
Pt left v/m; pt saw OB GYN this morning and was advised needed to have laproscopy. Pt wants to talk with Dr Deborra Medina prior to scheduling laproscopic exam and also wants to talk with Dr Deborra Medina about suggestions for problems with pain. Pt request cb [phone].

## 2014-07-19 NOTE — Telephone Encounter (Signed)
Spoke to pt and advised per Dr Deborra Medina; pt verbally expressed understanding. She also states that she completed mammogram today

## 2014-07-27 ENCOUNTER — Other Ambulatory Visit: Payer: Self-pay | Admitting: Family Medicine

## 2014-07-27 NOTE — Telephone Encounter (Signed)
Pt left v/m pt going to have surgery later this month for "her girl parts" and pt cannot get in for appt to pain mgt prior to surgery. Pt request Dr Deborra Medina to help with pain med until has surgery and get scheduled with pain mgt clinic. Pt request cb.

## 2014-07-30 NOTE — Telephone Encounter (Signed)
Lm on pts vm requesting a call back. Unfortunately, she is unable to receive pain meds.

## 2014-07-30 NOTE — Telephone Encounter (Signed)
I have already discussed this with her.  I cannot refill her narcotics as we discussed.  I am sorry that she is in pain.

## 2014-07-30 NOTE — Telephone Encounter (Signed)
Spoke to pt and advised per Dr Deborra Medina and pt verbally expressed understanding. Advised her to contact GYN. Pt also wanted to inform you that she is having a Dx mammogram on L breast.

## 2014-08-02 ENCOUNTER — Ambulatory Visit: Payer: Self-pay | Admitting: Obstetrics and Gynecology

## 2014-08-02 IMAGING — MG MM MAMMO DIAGNOSTIC UNILATERAL*L*
4 series · 4 of 4 positions shown · non-contrast
Comparison: Bilateral screening mammogram [DATE] performed at
[facility]

CLINICAL DATA: Possible mass left breast identified on recent
screening mammogram performed at [facility]. Prior to this
mammogram in [DATE], the patient reports she had not had a
mammogram since age 36.

EXAM:
DIGITAL DIAGNOSTIC  LEFT MAMMOGRAM
ULTRASOUND LEFT BREAST

[L CC (1 of 2)]
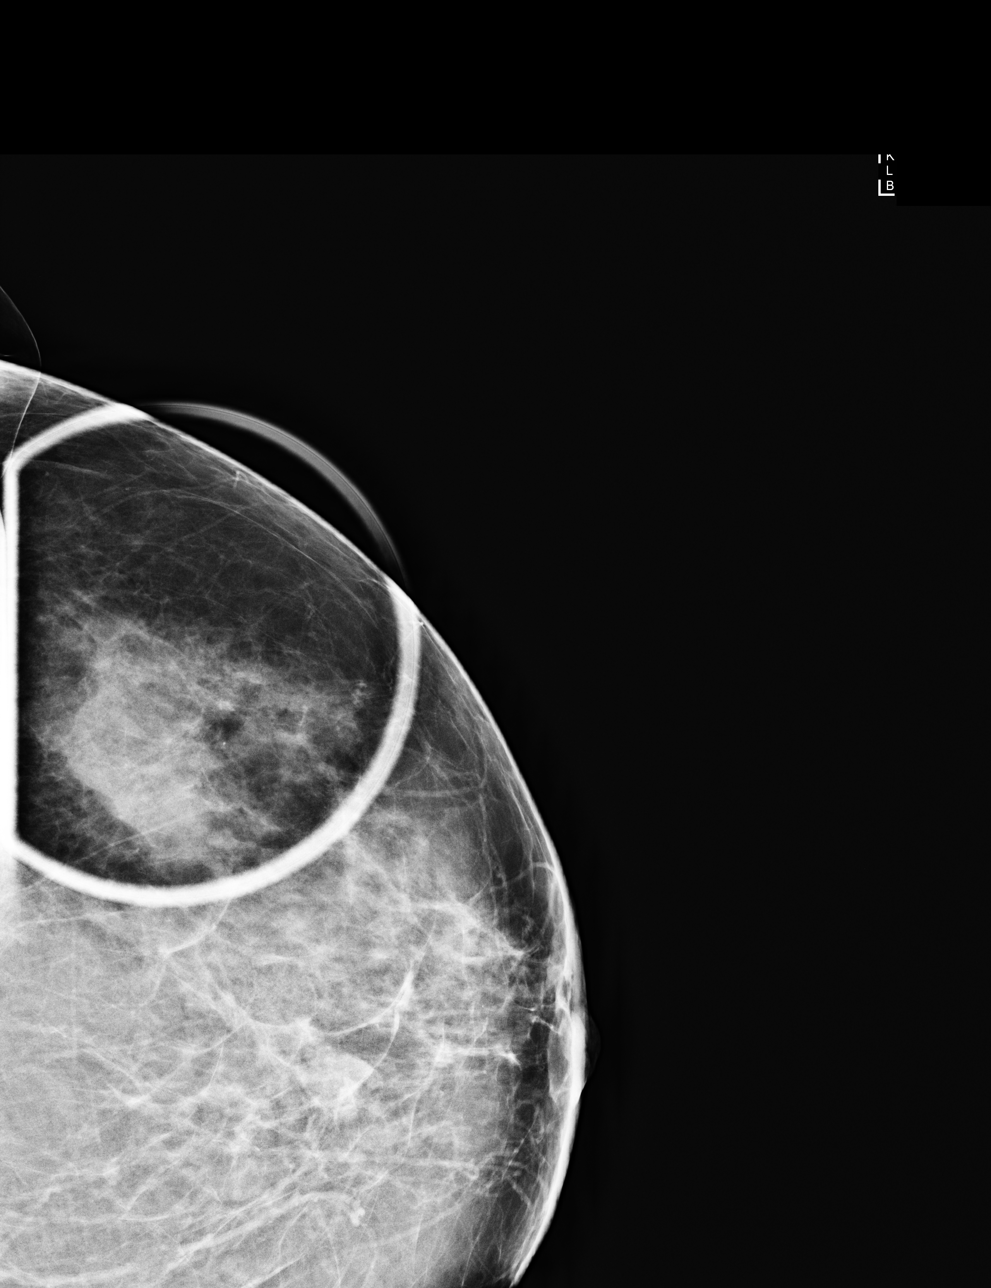

[L MLO (1 of 2)]
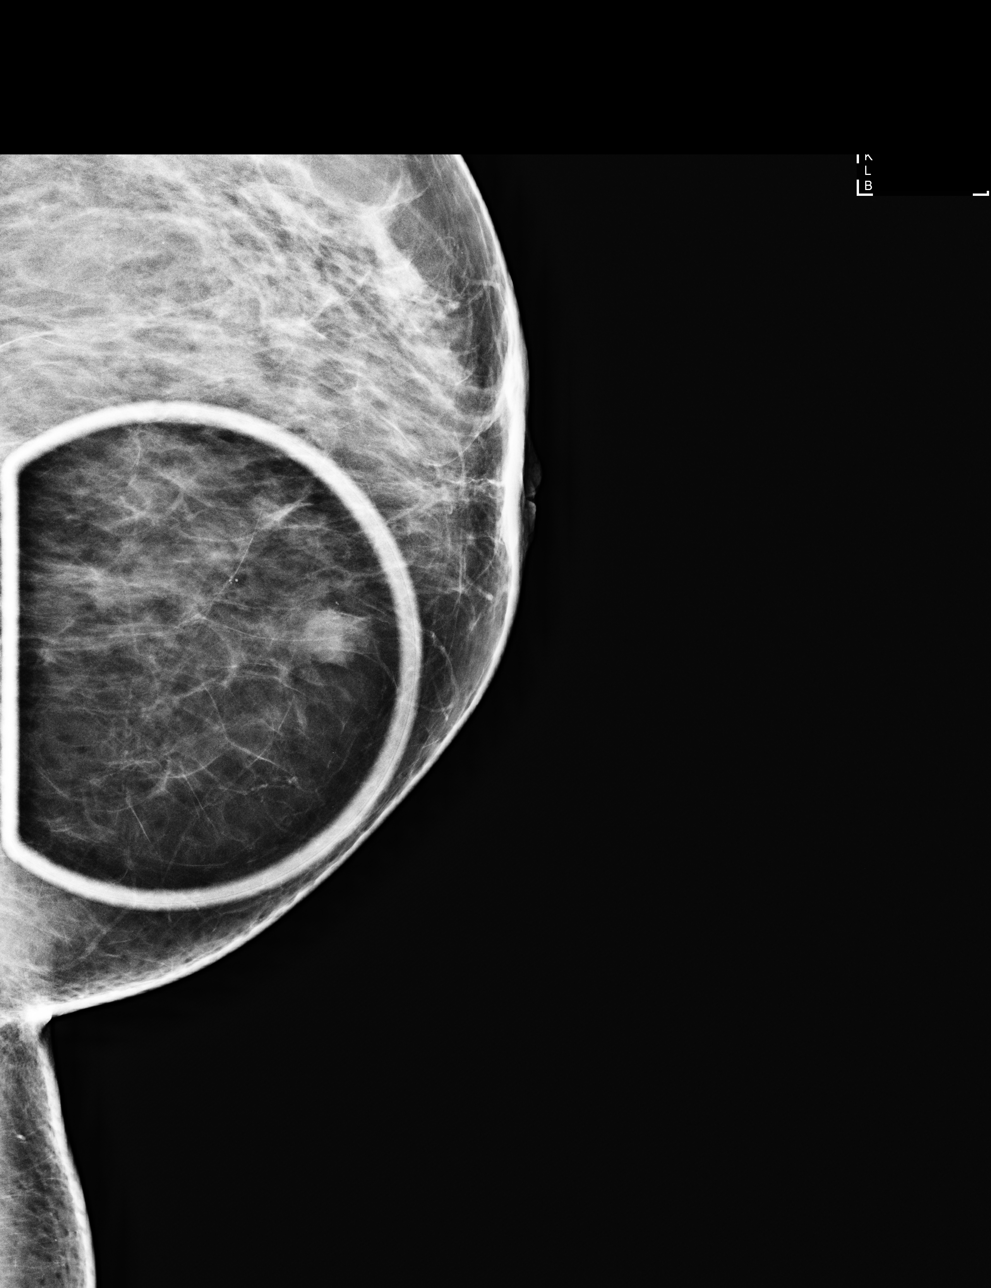

[L MLO (2 of 2)]
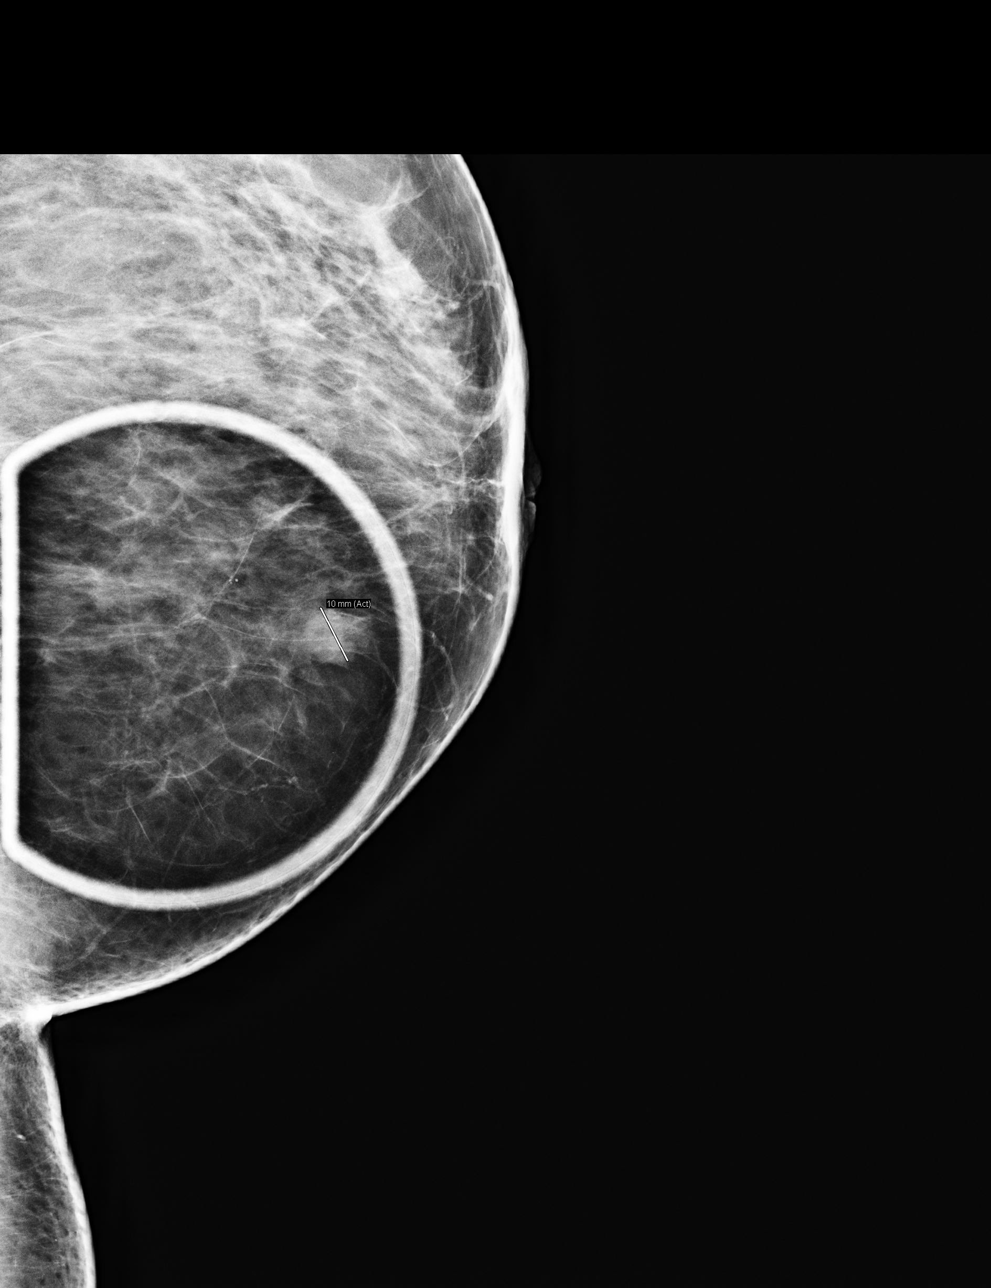

[L CC (2 of 2)]
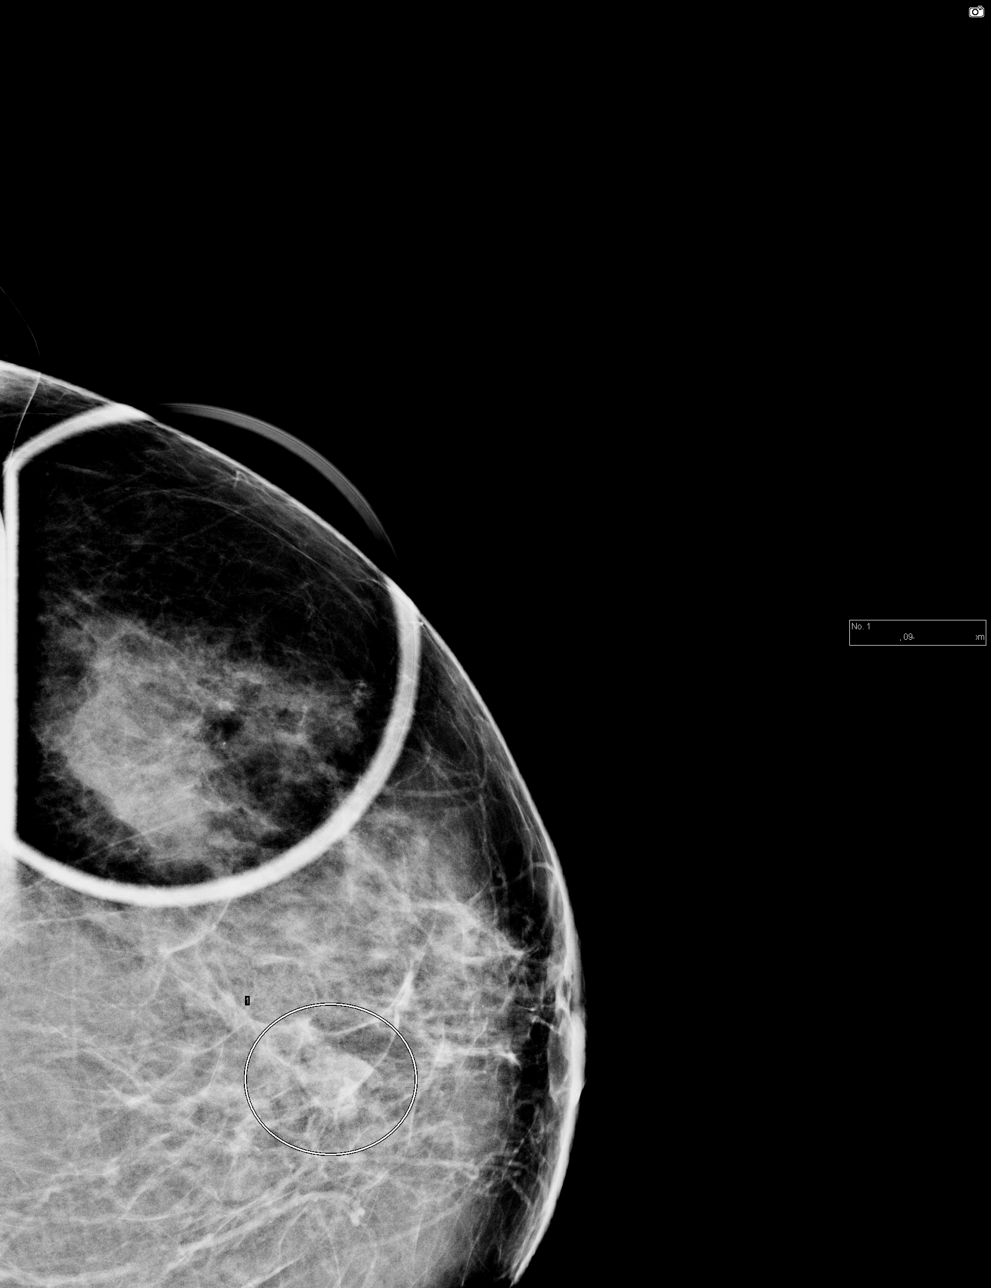

[4 of 4 positions shown; findings below may reference images not displayed]

ACR Breast Density Category c: The breast tissue is heterogeneously
dense, which may obscure small masses.
FINDINGS: Focal spot compression view of the inferior left breast confirms an
oval mass with indistinct margins. Focal spot compression view of
the lateral left breast is negative. However, on the spot
compression view, the mass can be seen directly behind the nipple,
confirming it to be in the 6 o'clock position.

On physical exam, no mass is palpated in the 6 o'clock region of the
left breast.

Ultrasound is performed, showing a homogeneously hypoechoic oval
mass with slightly microlobulated margins at 6 o'clock position 2 cm
from the nipple. This mass measures 1.0 x 0.6 x 1.0 cm. No internal
vascular flow is identified.

Ultrasound of the left axilla demonstrates normal axillary lymph
nodes. No lymphadenopathy is seen.
IMPRESSION: Solid 1.0 cm mass 6 o'clock position left breast. While this could
be a benign fibroadenoma, ultrasound-guided biopsy is suggested to
exclude malignancy.

RECOMMENDATION:
Ultrasound-guided core needle biopsy of left breast masses 6 o'clock
position.

I have discussed the findings and recommendations with the patient.
Results were also provided in writing at the conclusion of the
visit. If applicable, a reminder letter will be sent to the patient
regarding the next appointment.

BI-RADS CATEGORY  4: Suspicious.

## 2014-08-02 IMAGING — US US BREAST*L* LIMITED INC AXILLA
1 series · 9 of 9 positions shown · non-contrast
Comparison: Bilateral screening mammogram [DATE] performed at
[facility]

CLINICAL DATA: Possible mass left breast identified on recent
screening mammogram performed at [facility]. Prior to this
mammogram in [DATE], the patient reports she had not had a
mammogram since age 36.

EXAM:
DIGITAL DIAGNOSTIC  LEFT MAMMOGRAM
ULTRASOUND LEFT BREAST

[Series 1: us breast*left* limited inc axilla · 0.08mm/px · 9 of 9 slices shown]
[im 1/9]
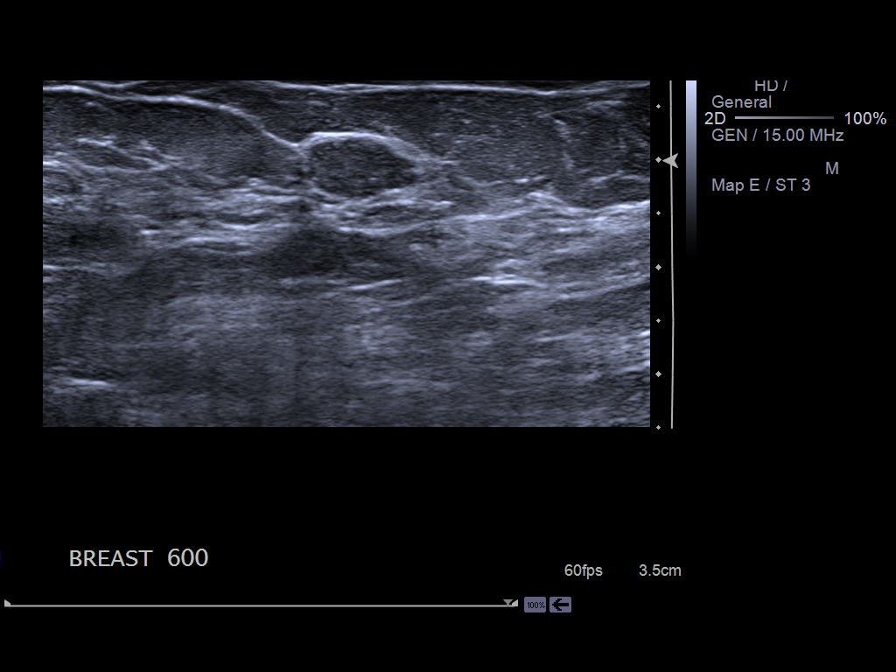
[im 2/9]
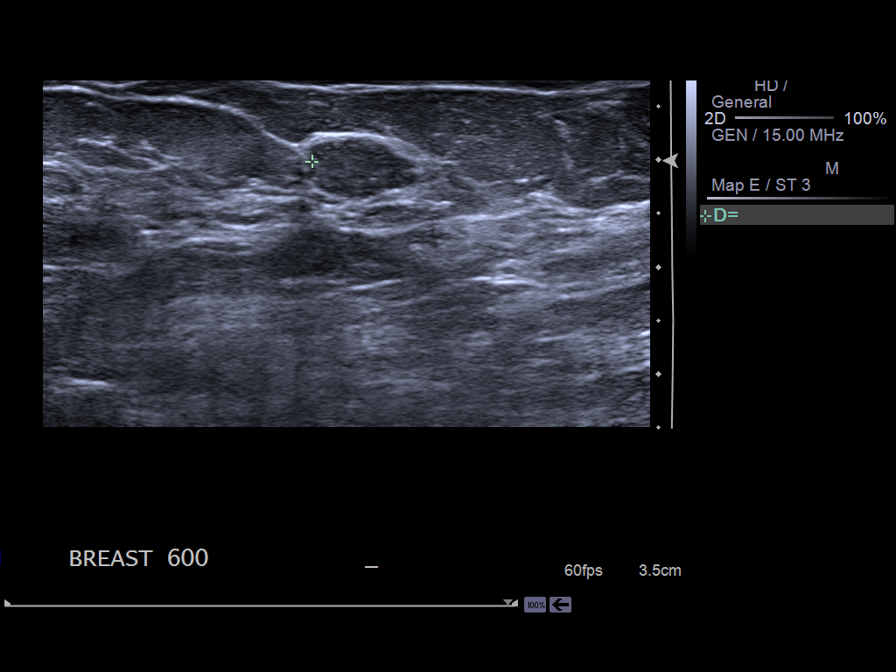
[im 3/9]
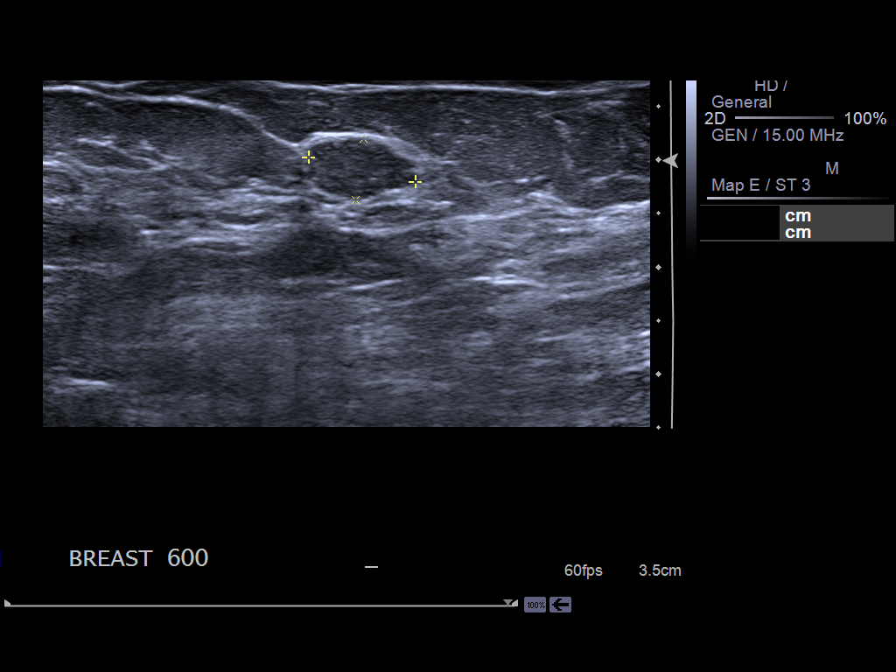
[im 4/9]
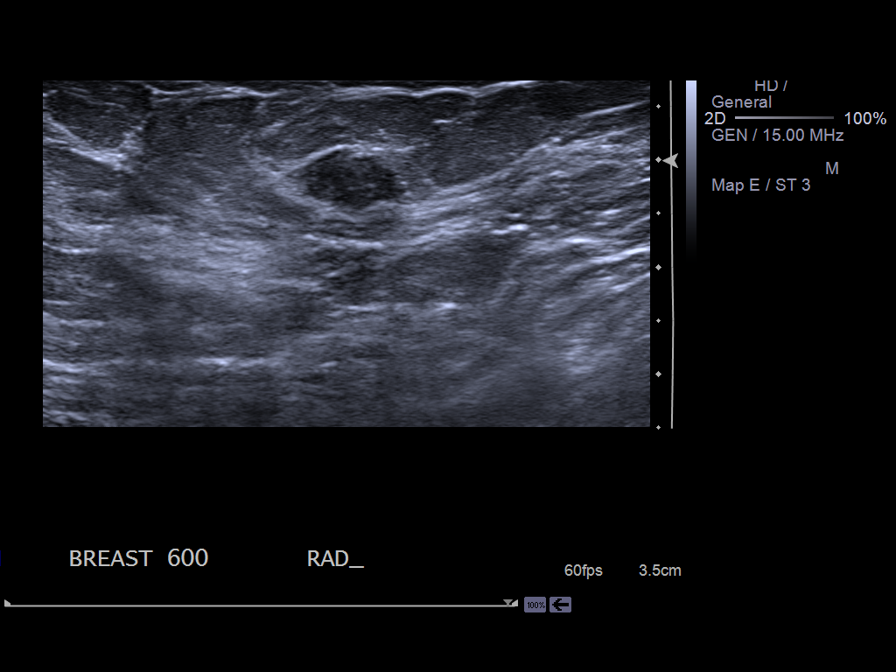
[im 5/9]
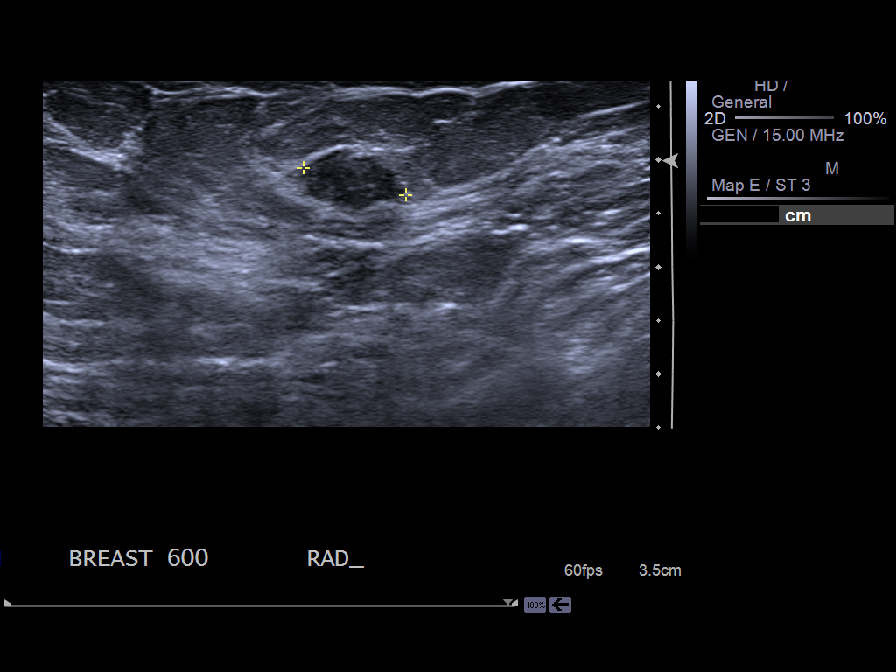
[im 6/9]
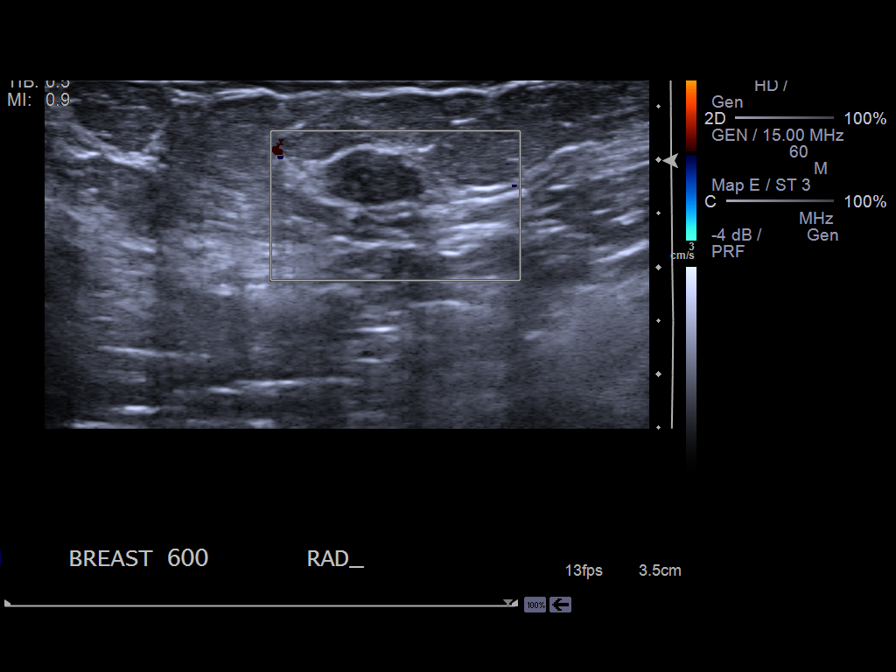
[im 7/9]
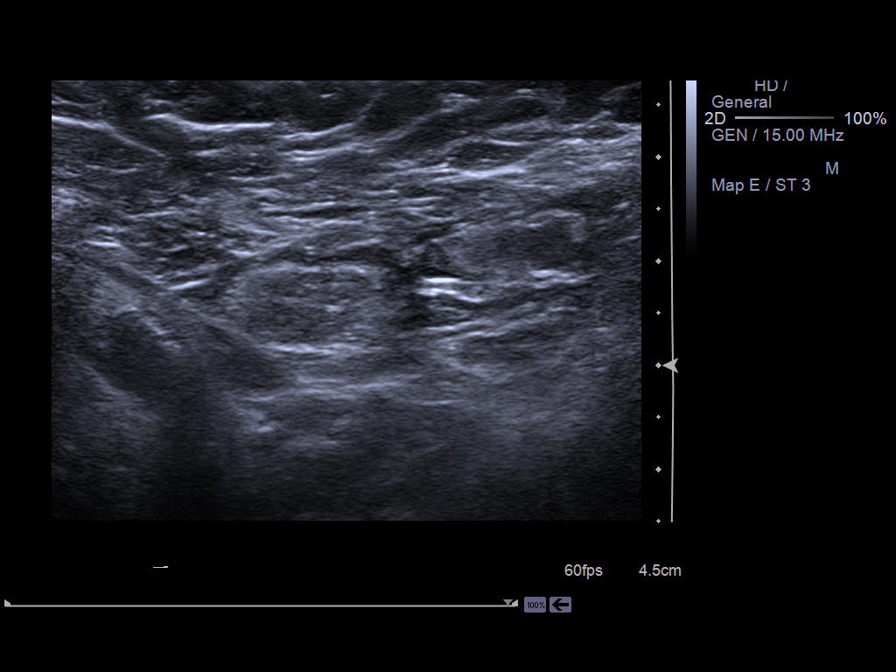
[im 8/9]
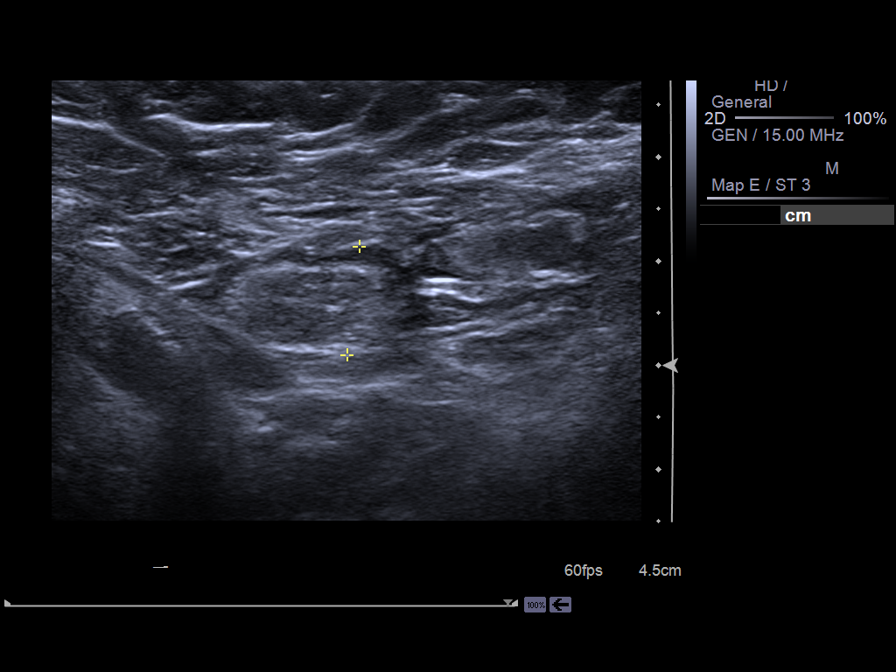
[im 9/9]
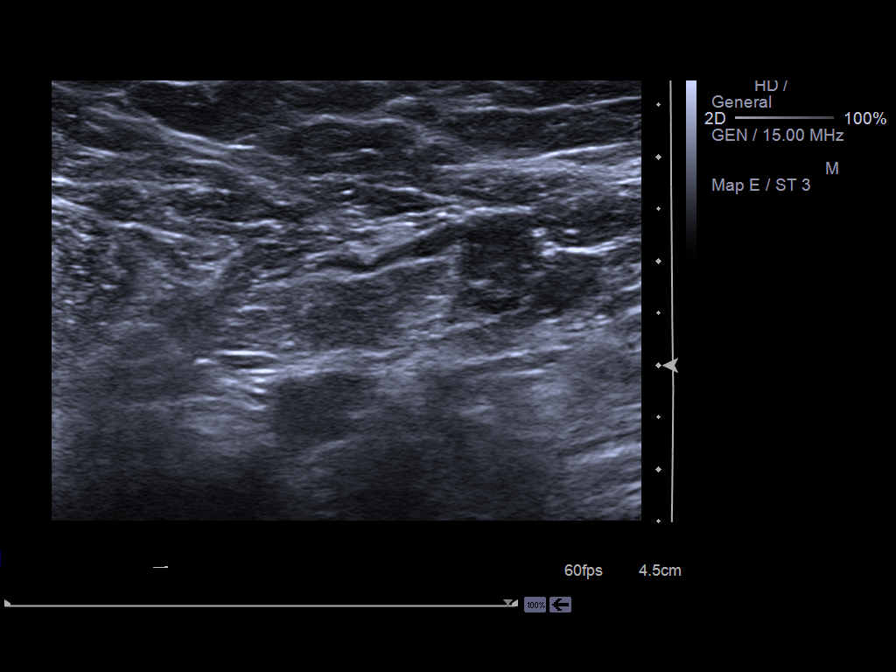

[9 of 9 positions shown; findings below may reference images not displayed]

ACR Breast Density Category c: The breast tissue is heterogeneously
dense, which may obscure small masses.
FINDINGS: Focal spot compression view of the inferior left breast confirms an
oval mass with indistinct margins. Focal spot compression view of
the lateral left breast is negative. However, on the spot
compression view, the mass can be seen directly behind the nipple,
confirming it to be in the 6 o'clock position.

On physical exam, no mass is palpated in the 6 o'clock region of the
left breast.

Ultrasound is performed, showing a homogeneously hypoechoic oval
mass with slightly microlobulated margins at 6 o'clock position 2 cm
from the nipple. This mass measures 1.0 x 0.6 x 1.0 cm. No internal
vascular flow is identified.

Ultrasound of the left axilla demonstrates normal axillary lymph
nodes. No lymphadenopathy is seen.
IMPRESSION: Solid 1.0 cm mass 6 o'clock position left breast. While this could
be a benign fibroadenoma, ultrasound-guided biopsy is suggested to
exclude malignancy.

RECOMMENDATION:
Ultrasound-guided core needle biopsy of left breast masses 6 o'clock
position.

I have discussed the findings and recommendations with the patient.
Results were also provided in writing at the conclusion of the
visit. If applicable, a reminder letter will be sent to the patient
regarding the next appointment.

BI-RADS CATEGORY  4: Suspicious.

## 2014-08-03 ENCOUNTER — Ambulatory Visit (INDEPENDENT_AMBULATORY_CARE_PROVIDER_SITE_OTHER): Payer: BC Managed Care – PPO | Admitting: Family Medicine

## 2014-08-03 ENCOUNTER — Encounter: Payer: Self-pay | Admitting: Family Medicine

## 2014-08-03 VITALS — BP 120/80 | HR 98 | Temp 99.0°F | Wt 204.0 lb

## 2014-08-03 DIAGNOSIS — R071 Chest pain on breathing: Secondary | ICD-10-CM

## 2014-08-03 DIAGNOSIS — R0789 Other chest pain: Secondary | ICD-10-CM

## 2014-08-03 NOTE — Progress Notes (Signed)
Lesion noted on L mammogram and has f/u bx pending.    She was recently started on rexulti.  She had restlessness noted on the medicine.   R sided chest pain.  No L sided sx.  Happened at rest, when laying in bed.  Less painful now.  Some L sided back pain, near the shoulder blade.  No FCNAVD.  No rash.  No sputum.  No cough.  No trauma.   She can still get a deep breath now.    Meds, vitals, and allergies reviewed.   ROS: See HPI.  Otherwise, noncontributory.  nad ncat Mmm OP wnl Neck supple no LA rrr Ctab, no wheeze, no dec in BS R side of chest wall ttp along the intercostal muscles in the mid chest No rash abd soft, not ttp Ext w/o edema

## 2014-08-03 NOTE — Patient Instructions (Signed)
This looks like incidental chest wall pain, from a pulled/strained muscle.  If you have progressive muscle pain, then notify Dr. Nicolasa Ducking about the rexulti.  Take care.

## 2014-08-05 DIAGNOSIS — R0789 Other chest pain: Secondary | ICD-10-CM | POA: Insufficient documentation

## 2014-08-05 NOTE — Assessment & Plan Note (Addendum)
reproducible on exam.  No sign of intrathoracic pathology.  D/w pt.  Likely benign chest wall pain, muscle strain. No need to image, no indication. D/w pt.  She was recently started on rexulti; unlikely to be med related but if continued, then she'll notify rx'ing MD.  Faythe Ghee for outpatient f/u.  Flu and prevnar reported by patient 1 month ago.

## 2014-08-07 ENCOUNTER — Ambulatory Visit: Payer: Self-pay | Admitting: Obstetrics and Gynecology

## 2014-08-07 ENCOUNTER — Telehealth: Payer: Self-pay | Admitting: Family Medicine

## 2014-08-07 IMAGING — MG MM POST US BIOPSY *L*
1 series · 2 of 2 positions shown · non-contrast
Comparison: Previous exams

CLINICAL DATA: 53-year-old status post ultrasound-guided biopsy of
a left breast mass at 6 o'clock

EXAM:
DIAGNOSTIC LEFT MAMMOGRAM POST ULTRASOUND BIOPSY

[L CC · left · 2 of 2 slices shown]
[im 1/2]
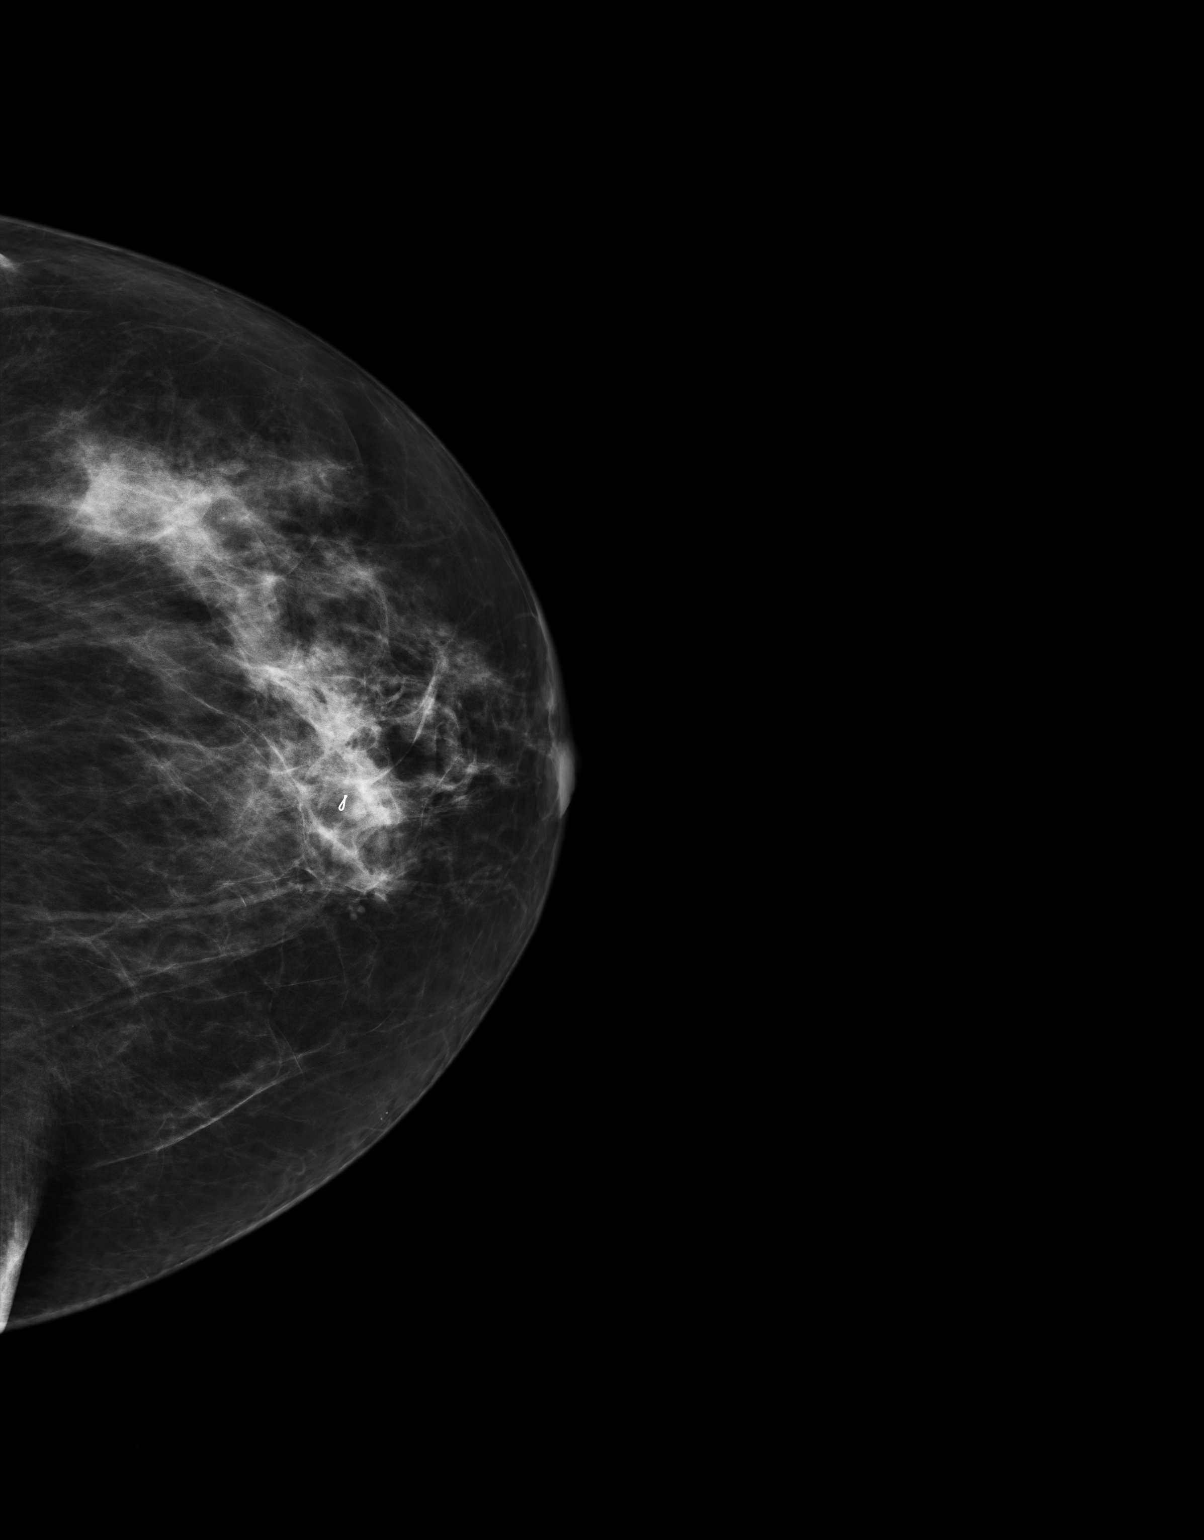
[im 2/2]
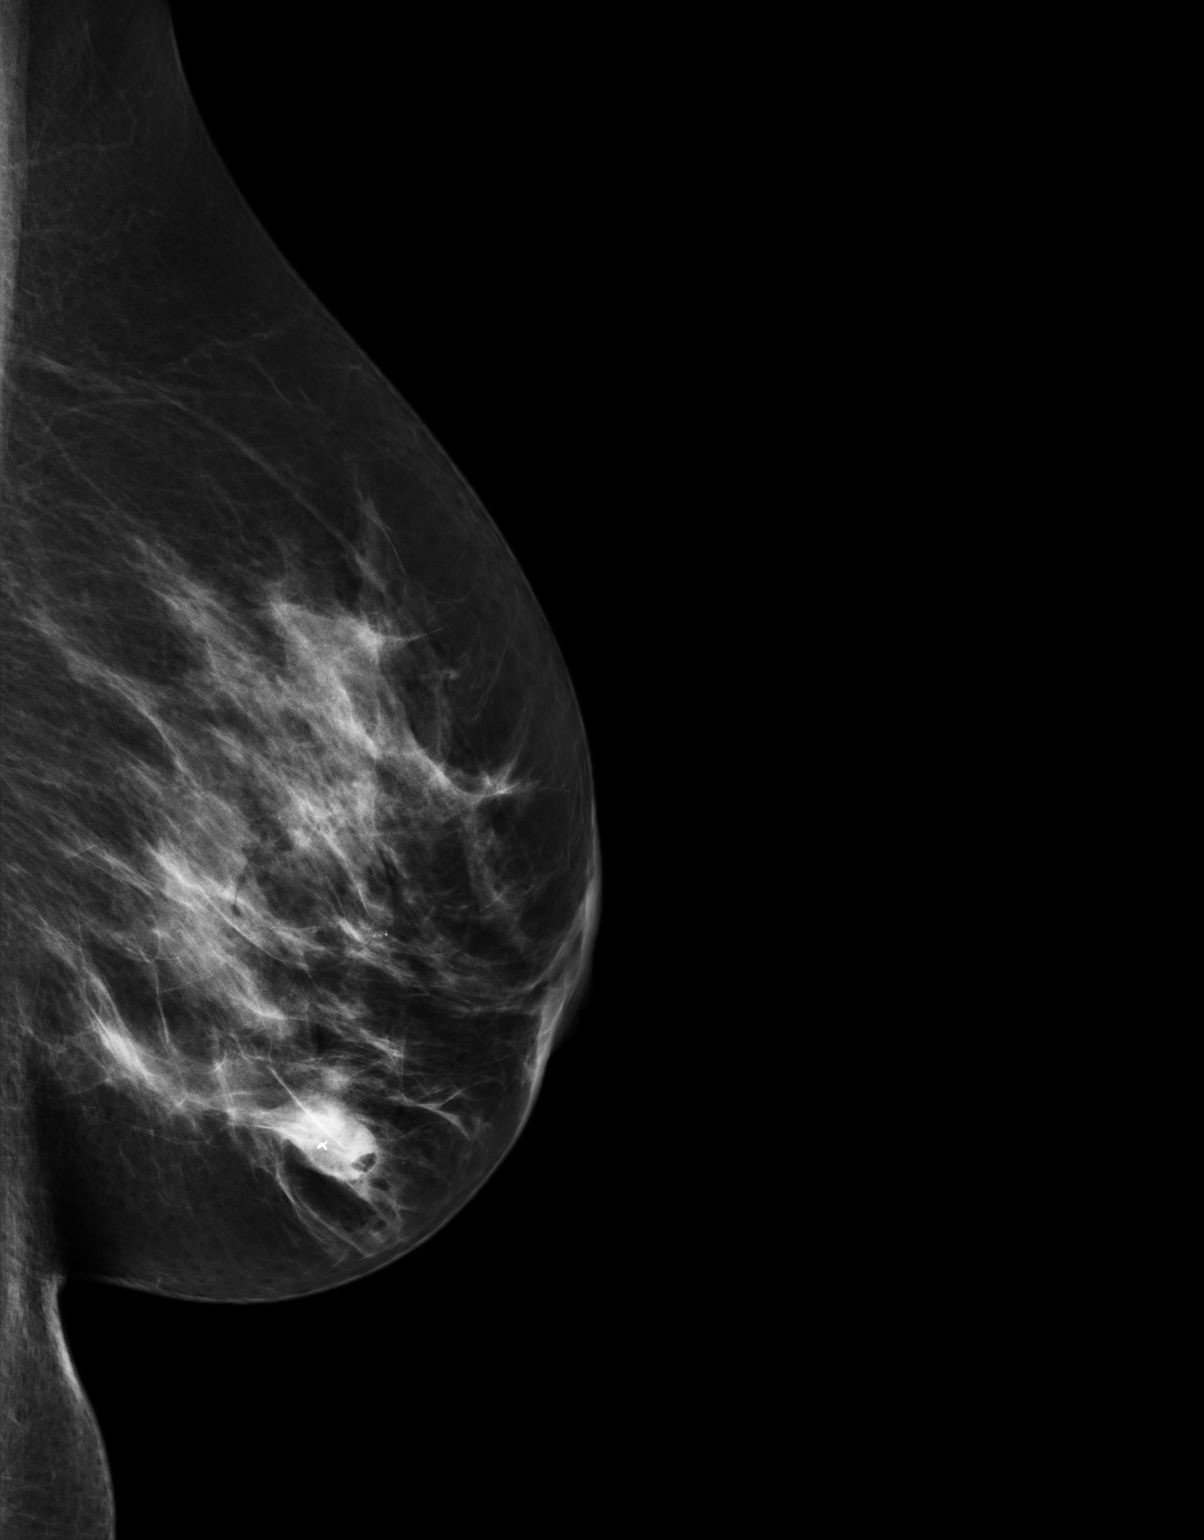

[2 of 2 positions shown; findings below may reference images not displayed]

FINDINGS: Mammographic images were obtained following ultrasound guided biopsy
of a left breast mass at 6 o'clock. Post biopsy images demonstrate
appropriate clip position.
IMPRESSION: Satisfactory clip placement status post ultrasound-guided biopsy of
a left breast mass at 6 o'clock.

Final Assessment: Post Procedure Mammograms for Marker Placement

## 2014-08-07 IMAGING — US US BIOPSY BREAST CORE W/ IMAGING
1 series · 8 of 8 positions shown · non-contrast
Comparison: Previous exams.

ADDENDUM:
Pathology of the left breast biopsy revealed FIBROADENOMA. There is
no atypia or malignancy. Usual ductal hyperplasia is present within
the fibroadenoma. This was found to be concordant by Dr. SANGITA
impression and notes.

Pathology was relayed to the patient by phone. She stated she has
had no bleeding but has had persistent pain at the biopsy site which
she is managing with Advil and Tylenol. She stated she has continued
to use the ice pack and wear a sports bra. The patient was
instructed to see her primary care doctor or return to the [REDACTED] if her pain does not improve. The patient was contacted by
Dr. SANGITA. Post biopsy instructions were reviewed with the patient.
The patient is to return in one year for a screening mammogram.
Pathology was relayed by SANGITA on [DATE] at [DATE].
CLINICAL DATA: 53-year-old female for biopsy of a left breast mass
at 6 o'clock
EXAM:
ULTRASOUND GUIDED LEFT BREAST CORE NEEDLE BIOPSY

[Series 1: us biopsy breast core w/ imaging · 0.08mm/px · 8 of 8 slices shown]
[im 1/8]
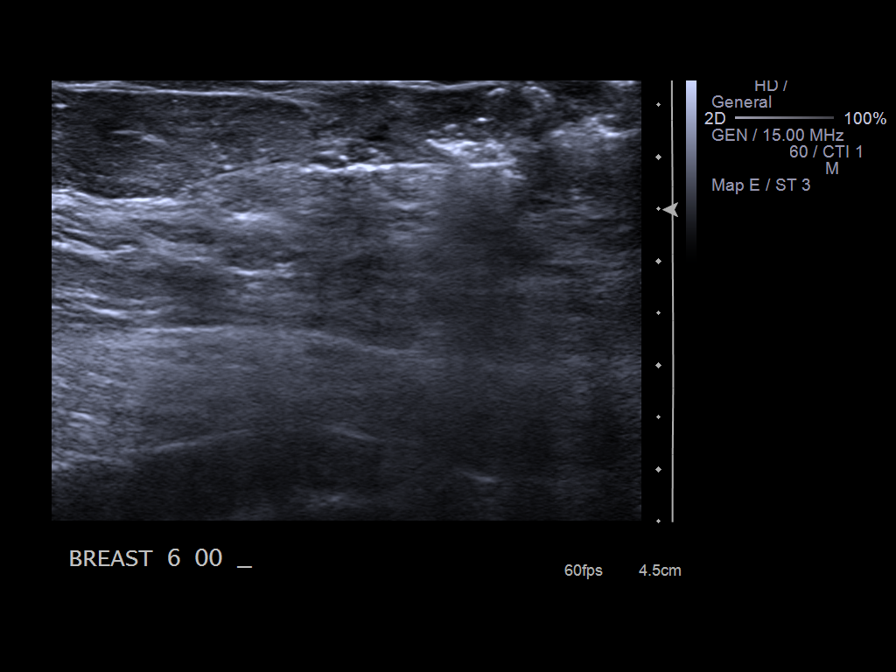
[im 2/8]
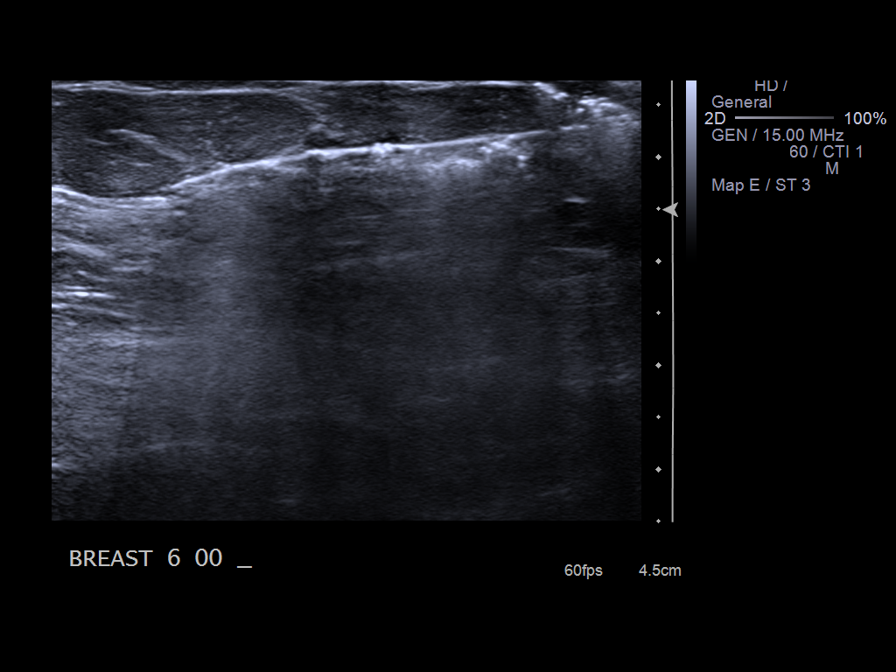
[im 3/8]
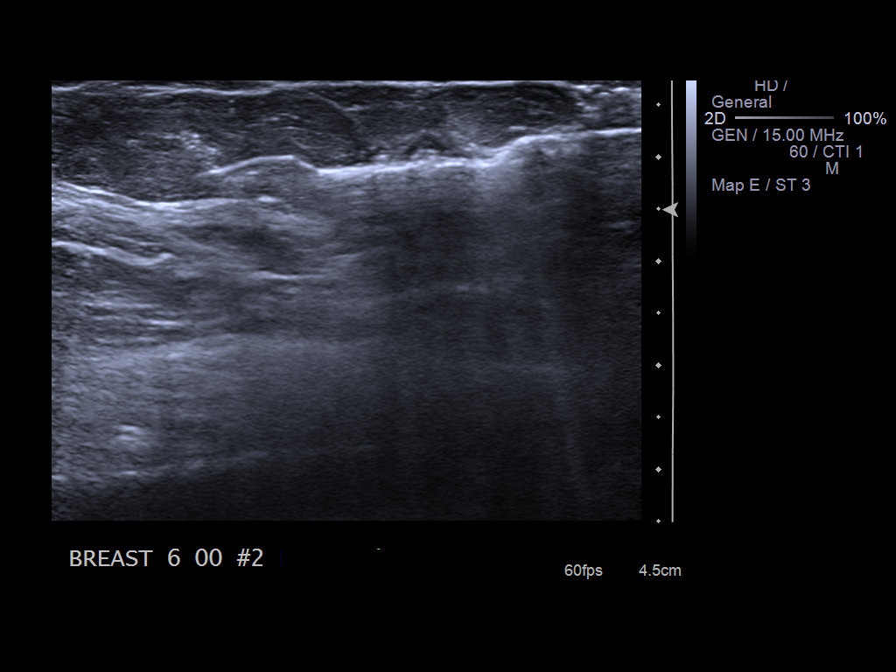
[im 4/8]
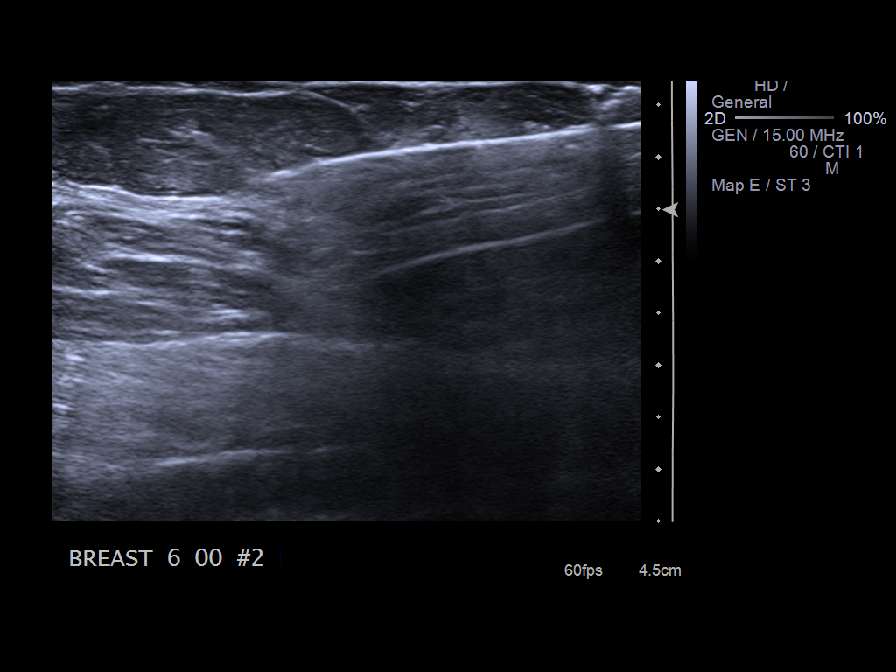
[im 5/8]
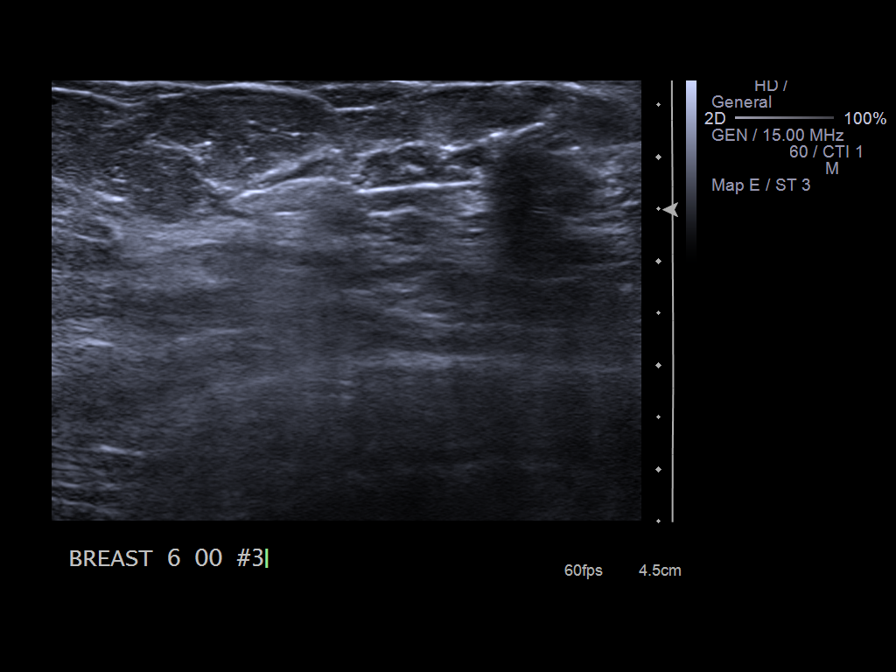
[im 6/8]
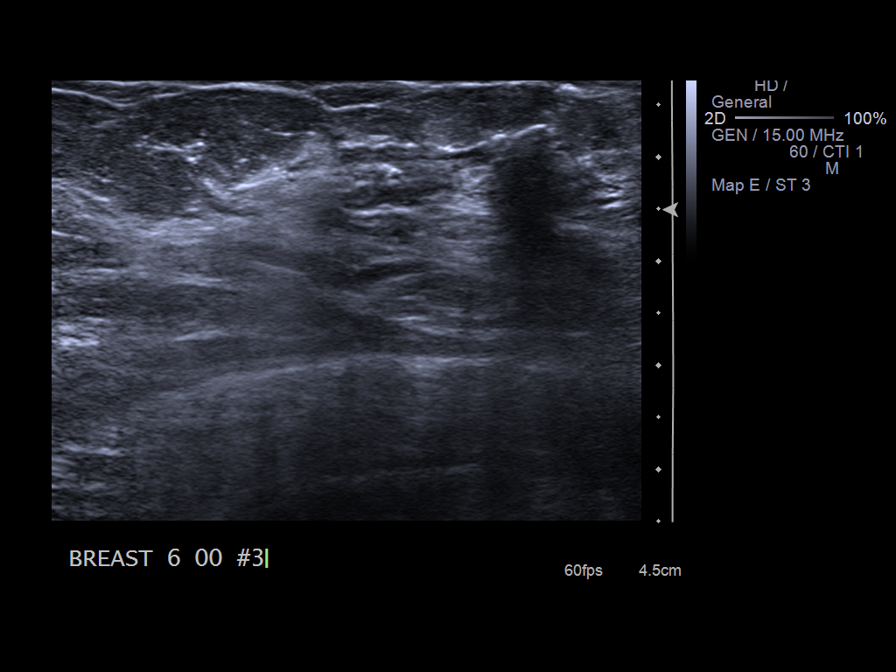
[im 7/8]
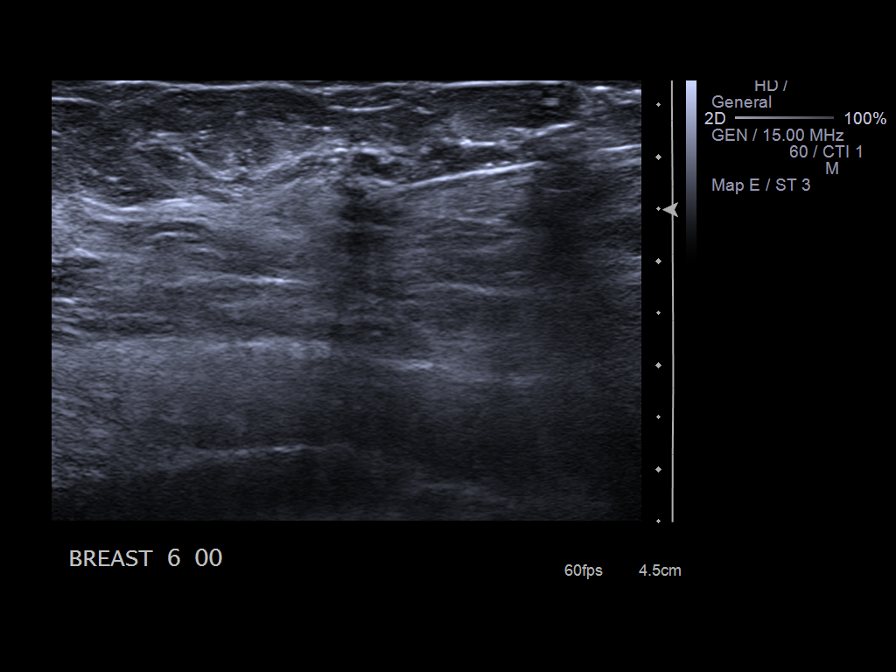
[im 8/8]
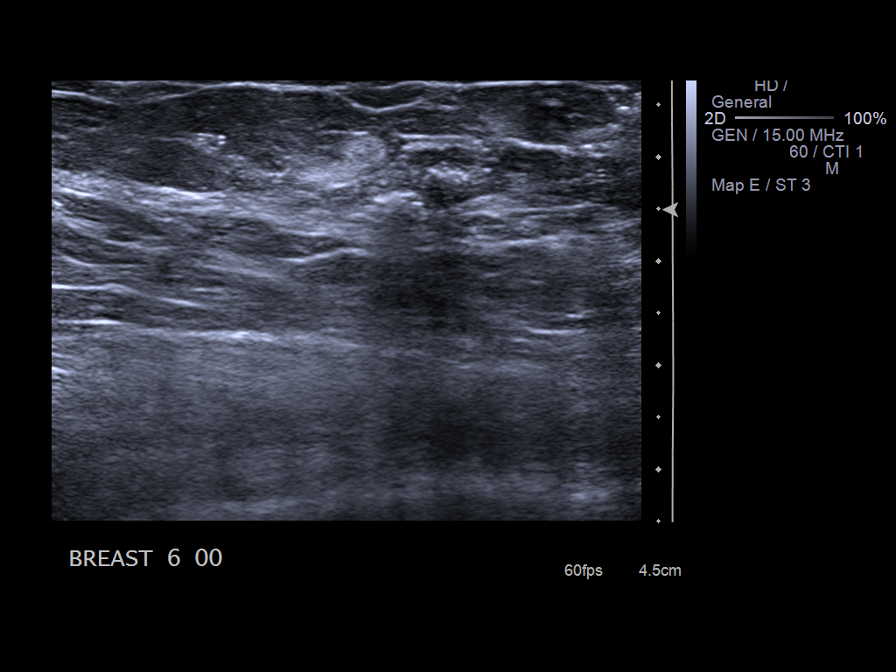

[8 of 8 positions shown; findings below may reference images not displayed]

PROCEDURE:
I met with the patient and we discussed the procedure of
ultrasound-guided biopsy, including benefits and alternatives. We
discussed the high likelihood of a successful procedure. We
discussed the risks of the procedure including infection, bleeding,
tissue injury, clip migration, and inadequate sampling. Informed
written consent was given. The usual time-out protocol was performed
immediately prior to the procedure.

Using sterile technique and 2% Lidocaine as local anesthetic, under
direct ultrasound visualization, a 12 gauge vacuum-assisteddevice
was used to perform biopsy of a left breast mass at 6 o'clock using
a lateral approach. At the conclusion of the procedure, a ribbon
tissue marker clip was deployed into the biopsy cavity. Follow-up
2-view mammogram was performed and dictated separately.
IMPRESSION: Ultrasound-guided biopsy of a left breast mass at 6 o'clock. No
apparent complications.

## 2014-08-07 NOTE — Telephone Encounter (Signed)
Pt called wanting to let dr Deborra Medina know she is going for a needle biopsy on left breast today @ [facility]  Pt stated she went to physiatrist and she wants another physiatrist    It is not going to work out with dr Nicolasa Ducking She is on a new meds dr Nicolasa Ducking switched latuda from [place] this caused her to have chest pains  She would like a call from dr Deborra Medina this afternoon if possible

## 2014-08-08 NOTE — Telephone Encounter (Signed)
Attempted to call pt. Voicemail not set up.

## 2014-08-09 LAB — PATHOLOGY REPORT

## 2014-08-21 ENCOUNTER — Other Ambulatory Visit: Payer: Self-pay | Admitting: Family Medicine

## 2014-08-28 ENCOUNTER — Telehealth: Payer: Self-pay | Admitting: Family Medicine

## 2014-08-28 ENCOUNTER — Telehealth: Payer: Self-pay

## 2014-08-28 DIAGNOSIS — F3160 Bipolar disorder, current episode mixed, unspecified: Secondary | ICD-10-CM

## 2014-08-28 NOTE — Telephone Encounter (Signed)
Pt left v/m requesting referral to psychiatrist, Dr Kristeen Miss group. Pt request cb.

## 2014-08-28 NOTE — Telephone Encounter (Signed)
Pt stated she went to psychiatrist and she wants another psychiatrist It is not going to work out with dr Nicolasa Ducking  She is on a new meds dr Nicolasa Ducking switched latuda from [address] this caused her to have chest pains Pt says that her vm is set up or you can call her @  Home [phone]

## 2014-08-29 NOTE — Telephone Encounter (Signed)
Yes I already received a message this am about her wanting a referral to Dr. Paula Libra and I have already placed that referral.

## 2014-08-29 NOTE — Telephone Encounter (Signed)
Referral placed.

## 2014-09-04 ENCOUNTER — Ambulatory Visit (INDEPENDENT_AMBULATORY_CARE_PROVIDER_SITE_OTHER): Payer: BC Managed Care – PPO | Admitting: Psychology

## 2014-09-04 DIAGNOSIS — F3181 Bipolar II disorder: Secondary | ICD-10-CM

## 2014-09-11 ENCOUNTER — Ambulatory Visit: Payer: BC Managed Care – PPO | Admitting: Psychology

## 2014-09-12 ENCOUNTER — Ambulatory Visit (INDEPENDENT_AMBULATORY_CARE_PROVIDER_SITE_OTHER): Payer: BC Managed Care – PPO | Admitting: Psychology

## 2014-09-12 DIAGNOSIS — F3181 Bipolar II disorder: Secondary | ICD-10-CM

## 2014-09-13 ENCOUNTER — Ambulatory Visit: Payer: BC Managed Care – PPO | Admitting: Psychology

## 2014-09-18 ENCOUNTER — Ambulatory Visit (INDEPENDENT_AMBULATORY_CARE_PROVIDER_SITE_OTHER): Payer: 59 | Admitting: Psychology

## 2014-09-18 DIAGNOSIS — F3181 Bipolar II disorder: Secondary | ICD-10-CM | POA: Diagnosis not present

## 2014-09-19 ENCOUNTER — Encounter: Payer: Self-pay | Admitting: Family Medicine

## 2014-09-19 ENCOUNTER — Ambulatory Visit (INDEPENDENT_AMBULATORY_CARE_PROVIDER_SITE_OTHER): Payer: Medicare Other | Admitting: Family Medicine

## 2014-09-19 VITALS — BP 114/80 | HR 101 | Temp 99.1°F | Ht 69.0 in | Wt 216.0 lb

## 2014-09-19 DIAGNOSIS — G894 Chronic pain syndrome: Secondary | ICD-10-CM | POA: Diagnosis not present

## 2014-09-19 DIAGNOSIS — G35 Multiple sclerosis: Secondary | ICD-10-CM

## 2014-09-19 DIAGNOSIS — M67952 Unspecified disorder of synovium and tendon, left thigh: Secondary | ICD-10-CM

## 2014-09-19 DIAGNOSIS — M25552 Pain in left hip: Secondary | ICD-10-CM | POA: Diagnosis not present

## 2014-09-19 DIAGNOSIS — M7062 Trochanteric bursitis, left hip: Secondary | ICD-10-CM

## 2014-09-19 MED ORDER — METHYLPREDNISOLONE ACETATE 40 MG/ML IJ SUSP
80.0000 mg | Freq: Once | INTRAMUSCULAR | Status: AC
  Administered 2014-09-19: 80 mg via INTRA_ARTICULAR

## 2014-09-19 MED ORDER — HYDROCODONE-ACETAMINOPHEN 10-325 MG PO TABS: 1.0000 | ORAL_TABLET | Freq: Four times a day (QID) | ORAL | Status: DC | PRN

## 2014-09-19 NOTE — Progress Notes (Signed)
Dr. Frederico Hamman T. Dearion Huot, MD, [facility] [place], [address] Phone: [phone] Fax: [phone]  09/19/2014  Patient: Emily Moon, MRN: [id number], DOB: 1960/12/18, 53 y.o.  Primary Physician:  Arnette Norris, MD  Chief Complaint: Hip Pain  Subjective:   Emily Moon is a 53 y.o. very pleasant female patient who presents with the following:  The patient is well-known to me and to our service in the office.  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  She also has ongoing chronic pain issues, with her narcotics most recently being filled in August 2015.  Patient also has significant multiple sclerosis.  She is here today primarily with lateral hip pain about the trochanteric bursa and some in her buttocks musculature.  She also has some that appears to be a little bit distal this around in the tensor fascia lata and upper ITB region.  Left hip is hurting her now. S/p total hip on the left.   Past Medical History, Surgical History, Social History, Family History, Problem List, Medications, and Allergies have been reviewed and updated if relevant.  GEN: No fevers, chills. Nontoxic. Primarily MSK c/o today. MSK: Detailed in the HPI GI: tolerating PO intake without difficulty Neuro: No numbness, parasthesias, or tingling associated. Otherwise the pertinent positives of the ROS are noted above.   Objective:   BP 114/80 mmHg  Pulse 101  Temp(Src) 99.1 F (37.3 C) (Oral)  Ht 5\' 9"  (1.753 m)  Wt 216 lb (97.977 kg)  BMI 31.88 kg/m2  LMP 08/23/2014   GEN: WDWN, NAD, Non-toxic, Alert & Oriented x 3 HEENT: Atraumatic, Normocephalic.  Ears and Nose: No external deformity. EXTR: No clubbing/cyanosis/edema NEURO: antalgic gait.  PSYCH: Normally interactive. Conversant. Not depressed  or anxious appearing.  Calm demeanor.    Lower back, there is some mild muscle spasm bilaterally from around the area of L3-S1.  She also has some significant pain in the buttocks region relatively diffusely and near the abductor insertion on the posterior pelvis.  LEFT trochanteric bursa significantly more tender than the RIGHT.  She is markedly tender to palpation and hypersensitive to even a small amount of touch on that bursa.  She is also tender for her for centimeters distal to this.  There is also some tenderness proximal to the true bursa as well in the hip abductor musculature.  She is not having any pain in the true groin.  Radiology: No results found.  Assessment and Plan:   Left hip pain - Plan: methylPREDNISolone acetate (DEPO-MEDROL) injection 80 mg  Trochanteric bursitis of left hip  Tendinopathy of left gluteus medius  Chronic pain syndrome  MULTIPLE SCLEROSIS, PROGRESSIVE/RELAPSING  From a pain management standpoint, I reviewed the patient's chart in detail, it appears that the patient has been getting routine amounts of Norco for some time.  She has not had any in a little bit over 2 months.  I spent some significant amount of time, and it appears she is compliant with her pain contract.  I gave her #90 of her most recent dose of Norco, and I will carbon copy her primary care provider as well.  Multiple musculoskeletal issues and multiple sclerosis.  She has developed some weakness in some of her pelvic support structure.  I reviewed some rehabilitation that is fairly basic, but she should be able to  do several times a week indefinitely.  Trochanteric Bursitis Injection, LEFT Verbal consent obtained. Risks (including infection, potential atrophy), benefits, and alternatives reviewed. Greater trochanter sterilely prepped with Chloraprep. Ethyl Chloride used for anesthesia. 8 cc of Lidocaine 1% injected with Depo-Medrol 80 mg into trochanteric bursa at area of maximal  tenderness at greater trochanter. Needle taken to bone to troch bursa, flows easily. Bursa massaged. No bleeding and no complications. Decreased pain after injection. Needle: 22 gauge spinal needle   Follow-up: No Follow-up on file.  New Prescriptions   No medications on file   No orders of the defined types were placed in this encounter.    Signed,  Maud Deed. Rishan Oyama, MD   Patient's Medications  New Prescriptions   No medications on file  Previous Medications   ASPIRIN 325 MG TABLET    Take 325 mg by mouth as needed.   BIOTIN PO    Take 2,000 mg by mouth daily.    BREXPIPRAZOLE (REXULTI) 0.5 MG TABS    Take by mouth.   CALCIUM CARBONATE (TUMS - DOSED IN MG ELEMENTAL CALCIUM) 500 MG CHEWABLE TABLET    Chew 3 tablets by mouth daily.   CETIRIZINE (ZYRTEC) 10 MG TABLET    Take 10 mg by mouth daily.   CHOLECALCIFEROL (VITAMIN D-3 PO)    Take 10,000 Units by mouth daily.    DESONIDE (DESOWEN) 0.05 % CREAM       DICLOFENAC SODIUM (VOLTAREN) 1 % GEL    Apply 4 g topically 4 (four) times daily.   DULOXETINE (CYMBALTA) 20 MG CAPSULE    Take 40 mg by mouth daily.   FLUTICASONE (FLONASE) 50 MCG/ACT NASAL SPRAY    Place into both nostrils daily.   HYDROXYZINE (ATARAX/VISTARIL) 25 MG TABLET    TAKE TWO TABLETS BY MOUTH NIGHTLY AT BEDTIME    LAMOTRIGINE (LAMICTAL) 150 MG TABLET    Take two tablets by mouth daily   LEVALBUTEROL (XOPENEX HFA) 45 MCG/ACT INHALER    Inhale 1-2 puffs into the lungs every 4 (four) hours as needed.   LYSINE 1000 MG TABS    Take 4,000 mg by mouth daily.    NICOTINE (NICODERM CQ - DOSED IN MG/24 HOURS) 21 MG/24HR PATCH    Place 21 mg onto the skin daily.   POLYETHYLENE GLYCOL POWDER (GLYCOLAX/MIRALAX) POWDER    Take 17 g by mouth 2 (two) times daily as needed.   TEMAZEPAM (RESTORIL) 30 MG CAPSULE    TAKE ONE TO TWO CAPSULES BY MOUTH NIGHTLY AT BEDTIME AS NEEDED    TIZANIDINE (ZANAFLEX) 4 MG TABLET    TAKE ONE TABLET BY MOUTH EVERY SIX HOURS AS NEEDED   Modified  Medications   Modified Medication Previous Medication   HYDROCODONE-ACETAMINOPHEN (NORCO) 10-325 MG PER TABLET HYDROcodone-acetaminophen (NORCO) 10-325 MG per tablet      Take 1 tablet by mouth every 6 (six) hours as needed.    Take 1 tablet by mouth every 6 (six) hours as needed for pain.  Discontinued Medications   No medications on file

## 2014-09-19 NOTE — Progress Notes (Signed)
Pre visit review using our clinic review tool, if applicable. No additional management support is needed unless otherwise documented below in the visit note. 

## 2014-09-25 ENCOUNTER — Ambulatory Visit (INDEPENDENT_AMBULATORY_CARE_PROVIDER_SITE_OTHER): Payer: 59 | Admitting: Psychology

## 2014-09-25 DIAGNOSIS — F3181 Bipolar II disorder: Secondary | ICD-10-CM | POA: Diagnosis not present

## 2014-10-02 ENCOUNTER — Ambulatory Visit (INDEPENDENT_AMBULATORY_CARE_PROVIDER_SITE_OTHER): Payer: 59 | Admitting: Psychology

## 2014-10-02 DIAGNOSIS — F3181 Bipolar II disorder: Secondary | ICD-10-CM

## 2014-10-09 ENCOUNTER — Ambulatory Visit (INDEPENDENT_AMBULATORY_CARE_PROVIDER_SITE_OTHER): Payer: 59 | Admitting: Psychology

## 2014-10-09 DIAGNOSIS — F3181 Bipolar II disorder: Secondary | ICD-10-CM | POA: Diagnosis not present

## 2014-10-16 ENCOUNTER — Ambulatory Visit (INDEPENDENT_AMBULATORY_CARE_PROVIDER_SITE_OTHER): Payer: 59 | Admitting: Psychology

## 2014-10-16 DIAGNOSIS — F3181 Bipolar II disorder: Secondary | ICD-10-CM

## 2014-10-29 ENCOUNTER — Other Ambulatory Visit: Payer: Self-pay | Admitting: Family Medicine

## 2014-10-30 ENCOUNTER — Telehealth: Payer: Self-pay

## 2014-10-30 ENCOUNTER — Ambulatory Visit: Payer: BC Managed Care – PPO | Admitting: Family Medicine

## 2014-10-30 NOTE — Telephone Encounter (Signed)
Pt has appt scheduled with Dr Deborra Medina on 10/31/14 at 11:45 am.

## 2014-10-30 NOTE — Telephone Encounter (Signed)
Pt left v/m requesting status of tizanidine refill; pt request refill to be done today. Pt request cb when refill done.

## 2014-10-30 NOTE — Telephone Encounter (Signed)
PLEASE NOTE: All timestamps contained within this report are represented as [place] Standard Time. CONFIDENTIALTY NOTICE: This fax transmission is intended only for the addressee. It contains information that is legally privileged, confidential or otherwise protected from use or disclosure. If you are not the intended recipient, you are strictly prohibited from reviewing, disclosing, copying using or disseminating any of this information or taking any action in reliance on or regarding this information. If you have received this fax in error, please notify us immediately by telephone so that we can arrange for its return to [place]. Phone: [phone], Toll-Free: [phone], Fax: [phone] Page: 1 of 2 Call Id: [id number] [place] Patient Name: Emily Moon Gender: Female DOB: 1961-06-20 Age: 53 Y 14 M 21 D Return Phone Number: [phone] (Primary), [phone] (Secondary) Address: [address] City/State/Zip: [place] [address] Client [facility] Client Client Site [place] - Day Physician [place], [place] Type Call Call Type Triage / Clinical Relationship To Patient Self Return Phone Number [phone] (Primary) Chief Complaint Vomiting Initial Comment Caller states c/o fever 101.1, diarrhea, vomiting, nasal congestion PreDisposition Did not know what to do Nurse Assessment Nurse: Tamala Julian, RN, Janett Billow Date/Time (Eastern Time): 10/30/2014 12:07:00 PM Confirm and document reason for call. If symptomatic, describe symptoms. ---Caller states c/o fever 101.1, diarrhea, vomiting, nasal congestion. Has the patient traveled out of the country within the last 30 days? ---No Does the patient require triage? ---Yes Related visit to physician within the last 2 weeks? ---No Does the PT have any chronic conditions? (i.e.  diabetes, asthma, etc.) ---Yes List chronic conditions. ---MS Did the patient indicate they were pregnant? ---No Guidelines Guideline Title Affirmed Question Affirmed Notes Nurse Date/Time (Eastern Time) Vomiting Fever present > 3 days (72 hours) Tamala Julian, RN, Janett Billow 10/30/2014 12:10:28 PM Disp. Time Eilene Ghazi Time) Disposition Final User 10/30/2014 12:00:48 PM Send To Clinical Follow Up Rich Brave, Amy 10/30/2014 12:20:52 PM See Physician within 24 Hours Yes Tamala Julian, RN, Janet Berlin Understands: Yes Disagree/Comply: Comply PLEASE NOTE: All timestamps contained within this report are represented as [place] Standard Time. CONFIDENTIALTY NOTICE: This fax transmission is intended only for the addressee. It contains information that is legally privileged, confidential or otherwise protected from use or disclosure. If you are not the intended recipient, you are strictly prohibited from reviewing, disclosing, copying using or disseminating any of this information or taking any action in reliance on or regarding this information. If you have received this fax in error, please notify us immediately by telephone so that we can arrange for its return to [place]. Phone: [phone], Toll-Free: [phone], Fax: [phone] Page: 2 of 2 Call Id: [id number] Care Advice Given Per Guideline SEE PHYSICIAN WITHIN 24 HOURS: * IF OFFICE WILL BE OPEN: You need to be examined within the next 24 hours. Call your doctor when the office opens, and make an appointment. AVOID NON-ESSENTIAL MEDS: * Discontinue all vitamins and non-prescription medicines for 24 hours. (Reason: may make vomiting worse.) CARE ADVICE per Vomiting (Adult) guideline. * You become worse. CALL BACK IF: * Avoid NSAIDs, which can cause gastritis CLEAR LIQUIDS: Try to sip small amounts (1 tablespoon or 15 ml) of liquid frequently (every 5 minutes) for 8 hours, rather than trying to drink a lot of liquid all at one time. * Sip water or a  rehydration drink (e.g., Gatorade or Powerade). * After 4 hours without vomiting, increase the  amount. SOLID FOOD: * You may begin eating bland foods after 8 hours without vomiting. * Start with saltine crackers, white bread, rice, mashed potatoes, cereal, applesauce, etc. * You can resume a normal diet in 24-48 hours. After Care Instructions Given Call Event Type User Date / Time Description Comments User: Coralee Pesa, RN Date/Time Eilene Ghazi Time): 10/30/2014 12:22:33 PM Caller has an appointment scheduled for tomorrow early afternoon Referrals REFERRED TO PCP OFFICE

## 2014-10-31 ENCOUNTER — Encounter: Payer: Self-pay | Admitting: Family Medicine

## 2014-10-31 ENCOUNTER — Ambulatory Visit (INDEPENDENT_AMBULATORY_CARE_PROVIDER_SITE_OTHER): Payer: Medicare Other | Admitting: Family Medicine

## 2014-10-31 VITALS — BP 120/78 | HR 108 | Temp 99.8°F | Wt 205.8 lb

## 2014-10-31 DIAGNOSIS — M546 Pain in thoracic spine: Secondary | ICD-10-CM

## 2014-10-31 DIAGNOSIS — R350 Frequency of micturition: Secondary | ICD-10-CM

## 2014-10-31 DIAGNOSIS — K529 Noninfective gastroenteritis and colitis, unspecified: Secondary | ICD-10-CM | POA: Diagnosis not present

## 2014-10-31 DIAGNOSIS — J069 Acute upper respiratory infection, unspecified: Secondary | ICD-10-CM | POA: Diagnosis not present

## 2014-10-31 DIAGNOSIS — M549 Dorsalgia, unspecified: Secondary | ICD-10-CM | POA: Insufficient documentation

## 2014-10-31 DIAGNOSIS — R34 Anuria and oliguria: Secondary | ICD-10-CM | POA: Insufficient documentation

## 2014-10-31 DIAGNOSIS — G8929 Other chronic pain: Secondary | ICD-10-CM | POA: Insufficient documentation

## 2014-10-31 LAB — POCT URINALYSIS DIPSTICK
Bilirubin, UA: NEGATIVE
Glucose, UA: NEGATIVE
KETONES UA: POSITIVE
Leukocytes, UA: NEGATIVE
Nitrite, UA: NEGATIVE
PH UA: 6
PROTEIN UA: POSITIVE
RBC UA: POSITIVE
Spec Grav, UA: 1.025
Urobilinogen, UA: 2

## 2014-10-31 MED ORDER — MIRABEGRON ER 25 MG PO TB24: 25.0000 mg | ORAL_TABLET | Freq: Every day | ORAL | Status: DC

## 2014-10-31 MED ORDER — PROMETHAZINE HCL 12.5 MG PO TABS: 12.5000 mg | ORAL_TABLET | Freq: Three times a day (TID) | ORAL | Status: DC | PRN

## 2014-10-31 MED ORDER — CALCIPOTRIENE-BETAMETH DIPROP 0.005-0.064 % EX OINT
TOPICAL_OINTMENT | Freq: Every day | CUTANEOUS | Status: DC
Start: 2014-10-31 — End: 2016-04-23

## 2014-10-31 MED ORDER — CLOBETASOL PROPIONATE 0.05 % EX CREA: 1.0000 "application " | TOPICAL_CREAM | Freq: Two times a day (BID) | CUTANEOUS | Status: DC

## 2014-10-31 NOTE — Progress Notes (Signed)
Subjective:   Patient ID: Emily Moon, female    DOB: [birth year], 53 y.o.   MRN: [id number]  Emily Moon is a pleasant 53 y.o. year old female who presents to clinic today with Emesis; Diarrhea; and Urinary Frequency  on 10/31/2014  HPI: Multiple symptoms.  On 12/4- started to develop URI symptoms- scratchy throat, chills.  Left for [facility] the next day and felt "awful"- congestion, fever, runny nose, malaise, SOB. On 12/14- acute onset of fever (tmax 101.8), diarrhea and vomiting.  Last had diarrhea two days ago, and last vomited yesterday.  Multiple episodes of dry heaving. No longer febrile.  Took imodium yesterday which did help.  Also not urinating as much, typically has to take myrbetriq.  Mild intermittent dysuria but she thinks this is better.  Having some thoracic bilateral back pain as well.  Current Outpatient Prescriptions on File Prior to Visit  Medication Sig Dispense Refill  . aspirin 325 MG tablet Take 325 mg by mouth as needed.    Marland Kitchen BIOTIN PO Take 2,000 mg by mouth daily.     . Brexpiprazole (REXULTI) 0.5 MG TABS Take by mouth.    . calcium carbonate (TUMS - DOSED IN MG ELEMENTAL CALCIUM) 500 MG chewable tablet Chew 3 tablets by mouth daily.    . cetirizine (ZYRTEC) 10 MG tablet Take 10 mg by mouth daily.    . Cholecalciferol (VITAMIN D-3 PO) Take 10,000 Units by mouth daily.     Marland Kitchen desonide (DESOWEN) 0.05 % cream     . diclofenac sodium (VOLTAREN) 1 % GEL Apply 4 g topically 4 (four) times daily. 500 g 5  . DULoxetine (CYMBALTA) 20 MG capsule Take 40 mg by mouth daily.    . fluticasone (FLONASE) 50 MCG/ACT nasal spray Place into both nostrils daily.    Marland Kitchen HYDROcodone-acetaminophen (NORCO) 10-325 MG per tablet Take 1 tablet by mouth every 6 (six) hours as needed. 90 tablet 0  . hydrOXYzine (ATARAX/VISTARIL) 25 MG tablet TAKE TWO TABLETS BY MOUTH NIGHTLY AT BEDTIME  60 tablet 5  . lamoTRIgine (LAMICTAL) 150 MG tablet Take two tablets by mouth daily 180  tablet 3  . levalbuterol (XOPENEX HFA) 45 MCG/ACT inhaler Inhale 1-2 puffs into the lungs every 4 (four) hours as needed. 1 Inhaler 0  . Lysine 1000 MG TABS Take 4,000 mg by mouth daily.     . nicotine (NICODERM CQ - DOSED IN MG/24 HOURS) 21 mg/24hr patch Place 21 mg onto the skin daily.    . polyethylene glycol powder (GLYCOLAX/MIRALAX) powder Take 17 g by mouth 2 (two) times daily as needed. 3350 g 1  . temazepam (RESTORIL) 30 MG capsule TAKE ONE TO TWO CAPSULES BY MOUTH NIGHTLY AT BEDTIME AS NEEDED  60 capsule 0  . tiZANidine (ZANAFLEX) 4 MG tablet TAKE ONE TABLET BY MOUTH EVERY SIX HOURS AS NEEDED  60 tablet 0   No current facility-administered medications on file prior to visit.    Allergies  Allergen Reactions  . Tylenol [Acetaminophen] Shortness Of Breath and Swelling  . Ultram [Tramadol Hcl]   . Aripiprazole   . Cefaclor   . Diclofenac Sodium   . Ibuprofen   . Naproxen Sodium     Past Medical History  Diagnosis Date  . Bipolar disorder   . Multiple sclerosis   . Depression   . GERD (gastroesophageal reflux disease)   . Nephrolithiasis   . Osteoporosis     osteoarthritis  . Fibromyalgia syndrome   .  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 Bipolar disorder, unspecified 05/21/2014    Past Surgical History  Procedure Laterality Date  . Tonsillectomy    . Joint replacement Left 2013    hip replacement    Family History  Problem Relation Age of Onset  . Cancer Father     History   Social History  . Marital Status: Divorced    Spouse Name: N/A    Number of Children: 1  . Years of Education: N/A   Occupational History  . 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 Smoking status: Former Smoker -- 0.50 packs/day for 29 years    Types: Cigarettes  . Smokeless tobacco: Never Used     Comment: recenty 04/26/2014  . Alcohol Use: No  . Drug Use: No  . Sexual Activity: No   Other Topics Concern  . Not on file   Social History  Narrative   The PMH, PSH, Social History, Family History, Medications, and allergies have been reviewed in [facility], and have been updated if relevant.   Review of Systems  Constitutional: Positive for fever, appetite change and fatigue.  HENT: Positive for congestion, ear pain, postnasal drip, rhinorrhea, sinus pressure, sneezing and sore throat. Negative for trouble swallowing.   Eyes: Negative.   Respiratory: Positive for cough and shortness of breath. Negative for apnea, chest tightness and wheezing.   Gastrointestinal: Positive for nausea, vomiting, abdominal pain and diarrhea. Negative for constipation, blood in stool, abdominal distention, anal bleeding and rectal pain.  Endocrine: Negative.   Genitourinary: Positive for dysuria and difficulty urinating. Negative for urgency, menstrual problem and pelvic pain.  Musculoskeletal: Positive for myalgias.  Allergic/Immunologic: Negative.   Neurological: Negative.   Hematological: Negative.   Psychiatric/Behavioral: Negative.   All other systems reviewed and are negative.      Objective:    BP 120/78 mmHg  Pulse 108  Temp(Src) 99.8 F (37.7 C) (Oral)  Wt 205 lb 12 oz (93.328 kg)  SpO2 99%   Physical Exam  Constitutional: She is oriented to person, place, and time. She appears well-developed and well-nourished. No distress.  HENT:  Head: Normocephalic and atraumatic.  Eyes: Pupils are equal, round, and reactive to light.  Neck: Normal range of motion.  Cardiovascular: Normal rate, regular rhythm and normal heart sounds.   Pulmonary/Chest: Breath sounds normal. No respiratory distress. She has no wheezes. She has no rales. She exhibits no tenderness.  Abdominal: Soft. Bowel sounds are normal. She exhibits no distension and no mass. There is tenderness. There is no rebound and no guarding.  Musculoskeletal: Normal range of motion.       Thoracic back: She exhibits pain. She exhibits normal range of motion, no tenderness, no bony  tenderness, no swelling, no deformity, no laceration, no spasm and normal pulse.  Pain elicited bilateral, mid thoracic with deep inspirations, NTTP  Neurological: She is alert and oriented to person, place, and time.  Skin: Skin is dry.  Psychiatric: She has a normal mood and affect. Her behavior is normal. Judgment and thought content normal.          Assessment & Plan:   Acute upper respiratory infection  Urinary frequency - Plan: Urinalysis Dipstick, Urine culture  Gastroenteritis, acute  Thoracic back pain, unspecified back pain laterality No Follow-up on file.

## 2014-10-31 NOTE — Assessment & Plan Note (Signed)
New- resolving. Exam reassuring. See below.

## 2014-10-31 NOTE — Assessment & Plan Note (Signed)
New- good lung sounds. ? Due to dry heaving and cough- some costochondiritis/ rib pain. Advised tylenol prn. If persists, CXR. The patient indicates understanding of these issues and agrees with the plan.

## 2014-10-31 NOTE — Patient Instructions (Signed)
Great to see you. Stay hydrated. Take phenergan as needed for nausea- this can make you sleepy.  Keep me updated.  Have a wonderful holiday.

## 2014-10-31 NOTE — Assessment & Plan Note (Signed)
New- consistent with acute illnesses. UA supportive of this- pos ketones, pos proteins. Continue to push fluids. STOP myrbetriq until symptoms resolved. Call or return to clinic prn if these symptoms worsen or fail to improve as anticipated. The patient indicates understanding of these issues and agrees with the plan.

## 2014-10-31 NOTE — Assessment & Plan Note (Signed)
New- discussed with Hoyle Sauer. I believe there are two things going on here.  Was fighting an URI the week prior, immune system was compromised, she was traveling and then unfortunately contracted gastroenteritis (probably viral). Advised supportive care - STOP taking Imodium. Phenergan eRx sent for prn nausea/vomiting.  Discussed sedation precautions.

## 2014-10-31 NOTE — Progress Notes (Signed)
Pre visit review using our clinic review tool, if applicable. No additional management support is needed unless otherwise documented below in the visit note. 

## 2014-11-01 NOTE — Telephone Encounter (Signed)
Pt states she is continuing to have low grad fever and it still is hard to breathe even though she is hydrating, was seen 10/31/14 for same symptoms.  Pt requests callback at [phone].

## 2014-11-02 LAB — URINE CULTURE
Colony Count: NO GROWTH
Organism ID, Bacteria: NO GROWTH

## 2014-11-05 NOTE — Telephone Encounter (Signed)
Noted! Thank you

## 2014-11-05 NOTE — Telephone Encounter (Signed)
Spoke with patient and she is feeling mostly better. She says she still has a low-grade temp between 99.4-99.6 and her chest feels a little tight, but she is able to loosen things up with hot showers and she feels much better than she did. She is coming in Wednesday for a cyst that needs to be evaluated near her anal area and said if she needs anything else, she will just address it at that time.

## 2014-11-05 NOTE — Telephone Encounter (Signed)
Please call to check on pt. 

## 2014-11-06 ENCOUNTER — Other Ambulatory Visit: Payer: Self-pay | Admitting: Family Medicine

## 2014-11-06 ENCOUNTER — Ambulatory Visit (INDEPENDENT_AMBULATORY_CARE_PROVIDER_SITE_OTHER): Payer: BC Managed Care – PPO | Admitting: Psychology

## 2014-11-06 DIAGNOSIS — F3181 Bipolar II disorder: Secondary | ICD-10-CM

## 2014-11-06 DIAGNOSIS — Z01419 Encounter for gynecological examination (general) (routine) without abnormal findings: Secondary | ICD-10-CM

## 2014-11-06 DIAGNOSIS — E785 Hyperlipidemia, unspecified: Secondary | ICD-10-CM

## 2014-11-07 ENCOUNTER — Ambulatory Visit (INDEPENDENT_AMBULATORY_CARE_PROVIDER_SITE_OTHER): Payer: Medicare Other | Admitting: Family Medicine

## 2014-11-07 ENCOUNTER — Encounter: Payer: Self-pay | Admitting: Family Medicine

## 2014-11-07 VITALS — BP 128/62 | HR 104 | Temp 99.0°F | Wt 209.5 lb

## 2014-11-07 DIAGNOSIS — K610 Anal abscess: Secondary | ICD-10-CM

## 2014-11-07 MED ORDER — OXYCODONE HCL 5 MG PO CAPS: 5.0000 mg | ORAL_CAPSULE | ORAL | Status: DC | PRN

## 2014-11-07 NOTE — Progress Notes (Signed)
Pre visit review using our clinic review tool, if applicable. No additional management support is needed unless otherwise documented below in the visit note. 

## 2014-11-07 NOTE — Progress Notes (Signed)
Subjective:   Patient ID: Emily Moon, female    DOB: [birth year], 53 y.o.   MRN: [id number]  Emily Moon is a pleasant 53 y.o. year old female who presents to clinic today with Rectal Pain  on 11/07/2014  HPI: Noticed rectal pain a few days ago, progressing. Felt a bump around her rectum- has been putting topical steroid cream on it and taking warm soakds. No improvement. She feels she is getting feverish. No nausea or vomiting.  Tried to "stick a pin in it," but nothing drained out.  Current Outpatient Prescriptions on File Prior to Visit  Medication Sig Dispense Refill  . aspirin 325 MG tablet Take 325 mg by mouth as needed.    Marland Kitchen BIOTIN PO Take 2,000 mg by mouth daily.     . Brexpiprazole (REXULTI) 0.5 MG TABS Take by mouth.    . calcipotriene-betamethasone (TACLONEX) ointment Apply topically daily. 60 g 0  . calcium carbonate (TUMS - DOSED IN MG ELEMENTAL CALCIUM) 500 MG chewable tablet Chew 3 tablets by mouth daily.    . cetirizine (ZYRTEC) 10 MG tablet Take 10 mg by mouth daily.    . Cholecalciferol (VITAMIN D-3 PO) Take 10,000 Units by mouth daily.     . clobetasol cream (TEMOVATE) 0.63 % Apply 1 application topically 2 (two) times daily. 30 g 0  . desonide (DESOWEN) 0.05 % cream     . diclofenac sodium (VOLTAREN) 1 % GEL Apply 4 g topically 4 (four) times daily. 500 g 5  . DULoxetine (CYMBALTA) 20 MG capsule Take 40 mg by mouth daily.    . fluticasone (FLONASE) 50 MCG/ACT nasal spray Place into both nostrils daily.    Marland Kitchen HYDROcodone-acetaminophen (NORCO) 10-325 MG per tablet Take 1 tablet by mouth every 6 (six) hours as needed. 90 tablet 0  . hydrOXYzine (ATARAX/VISTARIL) 25 MG tablet TAKE TWO TABLETS BY MOUTH NIGHTLY AT BEDTIME  60 tablet 5  . lamoTRIgine (LAMICTAL) 150 MG tablet Take two tablets by mouth daily 180 tablet 3  . levalbuterol (XOPENEX HFA) 45 MCG/ACT inhaler Inhale 1-2 puffs into the lungs every 4 (four) hours as needed. 1 Inhaler 0  . Lysine 1000  MG TABS Take 4,000 mg by mouth daily.     . mirabegron ER (MYRBETRIQ) 25 MG TB24 tablet Take 1 tablet (25 mg total) by mouth daily. 30 tablet 3  . nicotine (NICODERM CQ - DOSED IN MG/24 HOURS) 21 mg/24hr patch Place 21 mg onto the skin daily.    . polyethylene glycol powder (GLYCOLAX/MIRALAX) powder Take 17 g by mouth 2 (two) times daily as needed. 3350 g 1  . promethazine (PHENERGAN) 12.5 MG tablet Take 1 tablet (12.5 mg total) by mouth every 8 (eight) hours as needed for nausea or vomiting. 20 tablet 0  . temazepam (RESTORIL) 30 MG capsule TAKE ONE TO TWO CAPSULES BY MOUTH NIGHTLY AT BEDTIME AS NEEDED  60 capsule 0  . tiZANidine (ZANAFLEX) 4 MG tablet TAKE ONE TABLET BY MOUTH EVERY SIX HOURS AS NEEDED  60 tablet 0   No current facility-administered medications on file prior to visit.    Allergies  Allergen Reactions  . Tylenol [Acetaminophen] Shortness Of Breath and Swelling  . Ultram [Tramadol Hcl]   . Aripiprazole   . Cefaclor   . Diclofenac Sodium   . Ibuprofen   . Naproxen Sodium     Past Medical History  Diagnosis Date  . Bipolar disorder   . Multiple sclerosis   . Depression   .  GERD (gastroesophageal reflux disease)   . Nephrolithiasis   . Osteoporosis     osteoarthritis  . Fibromyalgia syndrome   . 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 Bipolar disorder, unspecified 05/21/2014    Past Surgical History  Procedure Laterality Date  . Tonsillectomy    . Joint replacement Left 2013    hip replacement    Family History  Problem Relation Age of Onset  . Cancer Father     History   Social History  . Marital Status: Divorced    Spouse Name: N/A    Number of Children: 1  . Years of Education: N/A   Occupational History  . 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 Smoking status: Former Smoker -- 0.50 packs/day for 29 years    Types: Cigarettes  . Smokeless tobacco: Never Used     Comment: recenty 04/26/2014  . Alcohol Use:  No  . Drug Use: No  . Sexual Activity: No   Other Topics Concern  . Not on file   Social History Narrative   The PMH, PSH, Social History, Family History, Medications, and allergies have been reviewed in [facility] [place], and have been updated if relevant.   Review of Systems  Constitutional: Positive for fever. Negative for chills.  Cardiovascular: Negative.   Gastrointestinal: Positive for rectal pain.  Hematological: Negative.   All other systems reviewed and are negative.      Objective:    BP 128/62 mmHg  Pulse 104  Temp(Src) 99 F (37.2 C) (Oral)  Wt 209 lb 8 oz (95.029 kg)  SpO2 98%   Physical Exam  Constitutional: She is oriented to person, place, and time. She appears well-developed and well-nourished. No distress.  HENT:  Head: Normocephalic.  Eyes: Conjunctivae are normal.  Neck: Normal range of motion.  Cardiovascular: Normal rate.   Pulmonary/Chest: Effort normal.  Genitourinary:     Musculoskeletal: Normal range of motion.  Neurological: She is alert and oriented to person, place, and time.  Skin: Skin is warm and dry.  Psychiatric: She has a normal mood and affect. Her behavior is normal. Judgment and thought content normal.  Nursing note and vitals reviewed.         Assessment & Plan:   Perianal abscess - Plan: Ambulatory referral to General Surgery No Follow-up on file.

## 2014-11-07 NOTE — Assessment & Plan Note (Signed)
New- refer urgently to surgery for I and D. Given oxycodone- 5 mg q 4 hours prn pain, #20 ONLY.  Will not refill this. The patient indicates understanding of these issues and agrees with the plan.

## 2014-11-07 NOTE — Patient Instructions (Signed)
Peri-Rectal Abscess  Your caregiver has diagnosed you as having a peri-rectal abscess. This is an infected area near the rectum that is filled with pus. If the abscess is near the surface of the skin, your caregiver may open (incise) the area and drain the pus.  HOME CARE INSTRUCTIONS    If your abscess was opened up and drained. A small piece of gauze may be placed in the opening so that it can drain. Do not remove the gauze unless directed by your caregiver.   A loose dressing may be placed over the abscess site. Change the dressing as often as necessary to keep it clean and dry.   After the drain is removed, the area may be washed with a gentle antiseptic (soap) four times per day.   A warm sitz bath, warm packs or heating pad may be used for pain relief, taking care not to burn yourself.   Return for a wound check in 1 day or as directed.   An "inflatable doughnut" may be used for sitting with added comfort. These can be purchased at a drugstore or medical supply house.   To reduce pain and straining with bowel movements, eat a high fiber diet with plenty of fruits and vegetables. Use stool softeners as recommended by your caregiver. This is especially important if narcotic type pain medications were prescribed as these may cause marked constipation.   Only take over-the-counter or prescription medicines for pain, discomfort, or fever as directed by your caregiver.  SEEK IMMEDIATE MEDICAL CARE IF:    You have increasing pain that is not controlled by medication.   There is increased inflammation (redness), swelling, bleeding, or drainage from the area.   An oral temperature above 102 F (38.9 C) develops.   You develop chills or generalized malaise (feel lethargic or feel "washed out").   You develop any new symptoms (problems) you feel may be related to your present problem.  Document Released: 10/30/2000 Document Revised: 01/25/2012 Document Reviewed: 10/30/2008  ExitCare Patient Information  2015 [employer]. This information is not intended to replace advice given to you by your health care provider. Make sure you discuss any questions you have with your health care provider.

## 2014-11-13 ENCOUNTER — Other Ambulatory Visit (INDEPENDENT_AMBULATORY_CARE_PROVIDER_SITE_OTHER): Payer: Medicare Other

## 2014-11-13 DIAGNOSIS — E785 Hyperlipidemia, unspecified: Secondary | ICD-10-CM | POA: Diagnosis not present

## 2014-11-13 DIAGNOSIS — Z0000 Encounter for general adult medical examination without abnormal findings: Secondary | ICD-10-CM

## 2014-11-13 DIAGNOSIS — Z01419 Encounter for gynecological examination (general) (routine) without abnormal findings: Secondary | ICD-10-CM

## 2014-11-13 LAB — LIPID PANEL
CHOL/HDL RATIO: 6
Cholesterol: 320 mg/dL — ABNORMAL HIGH (ref 0–200)
HDL: 57.7 mg/dL (ref >39.00)
LDL Cholesterol: 223 mg/dL — ABNORMAL HIGH (ref 0–99)
NonHDL: 262.3
Triglycerides: 196 mg/dL — ABNORMAL HIGH (ref 0.0–149.0)
VLDL: 39.2 mg/dL (ref 0.0–40.0)

## 2014-11-13 LAB — CBC WITH DIFFERENTIAL/PLATELET
BASOS ABS: 0.1 10*3/uL (ref 0.0–0.1)
BASOS PCT: 0.6 % (ref 0.0–3.0)
EOS PCT: 2.2 % (ref 0.0–5.0)
Eosinophils Absolute: 0.2 10*3/uL (ref 0.0–0.7)
HEMATOCRIT: 41.2 % (ref 36.0–46.0)
Hemoglobin: 13.6 g/dL (ref 12.0–15.0)
LYMPHS ABS: 2.3 10*3/uL (ref 0.7–4.0)
LYMPHS PCT: 27.3 % (ref 12.0–46.0)
MCHC: 32.9 g/dL (ref 30.0–36.0)
MCV: 95 fl (ref 78.0–100.0)
Monocytes Absolute: 0.4 10*3/uL (ref 0.1–1.0)
Monocytes Relative: 5.1 % (ref 3.0–12.0)
NEUTROS ABS: 5.4 10*3/uL (ref 1.4–7.7)
Neutrophils Relative %: 64.8 % (ref 43.0–77.0)
Platelets: 374 10*3/uL (ref 150.0–400.0)
RBC: 4.34 Mil/uL (ref 3.87–5.11)
RDW: 14 % (ref 11.5–15.5)
WBC: 8.3 10*3/uL (ref 4.0–10.5)

## 2014-11-13 LAB — COMPREHENSIVE METABOLIC PANEL
ALK PHOS: 57 U/L (ref 39–117)
ALT: 13 U/L (ref 0–35)
AST: 13 U/L (ref 0–37)
Albumin: 4 g/dL (ref 3.5–5.2)
BILIRUBIN TOTAL: 0.4 mg/dL (ref 0.2–1.2)
BUN: 19 mg/dL (ref 6–23)
CO2: 27 meq/L (ref 19–32)
CREATININE: 0.8 mg/dL (ref 0.4–1.2)
Calcium: 9.4 mg/dL (ref 8.4–10.5)
Chloride: 107 mEq/L (ref 96–112)
GFR: 76.55 mL/min (ref >60.00)
GLUCOSE: 104 mg/dL — AB (ref 70–99)
Potassium: 4 mEq/L (ref 3.5–5.1)
Sodium: 142 mEq/L (ref 135–145)
TOTAL PROTEIN: 7.4 g/dL (ref 6.0–8.3)

## 2014-11-13 LAB — TSH: TSH: 2.27 u[IU]/mL (ref 0.35–4.50)

## 2014-11-15 ENCOUNTER — Other Ambulatory Visit: Payer: Self-pay

## 2014-11-15 MED ORDER — DICLOFENAC SODIUM 1 % TD GEL: 4.0000 g | Freq: Four times a day (QID) | TRANSDERMAL | Status: DC

## 2014-11-15 NOTE — Telephone Encounter (Signed)
Pt left v/m requesting refill voltaren gel to [facility]; pt request refill to be done today because pts deductible will be extremely high starting 11/16/2014. Pt request cb.

## 2014-11-20 ENCOUNTER — Ambulatory Visit (INDEPENDENT_AMBULATORY_CARE_PROVIDER_SITE_OTHER): Payer: 59 | Admitting: Psychology

## 2014-11-20 ENCOUNTER — Encounter: Payer: Medicare Other | Admitting: Family Medicine

## 2014-11-20 DIAGNOSIS — F3181 Bipolar II disorder: Secondary | ICD-10-CM

## 2014-11-22 ENCOUNTER — Telehealth: Payer: Self-pay | Admitting: *Deleted

## 2014-11-22 NOTE — Telephone Encounter (Signed)
-----   Message from Lucille Passy, MD sent at 11/20/2014  2:17 PM EST ----- Please call pt to let her know that her labs overall look great but her cholesterol is VERY elevated- we need to start her on cholesterol rx right away.  Please let me know if she is ok with this and I will send rx to her pharmacy and ask her to return in 4 weeks for follow up labs. Also send her information on low cholesterol diet please. Thanks!

## 2014-11-22 NOTE — Telephone Encounter (Signed)
Spoke to pt and informed her of results and instruction. Pt states that she WILL NOT start a lipid medication. She states that she is taking organic apple cider vinegar; two tbsp every am. She states that she spoke to a pharmacist, who advised her of the vinegar. She reports that she is unable to take the med due to it causing "extreme pain." Lipid lowering info mailed.

## 2014-11-27 ENCOUNTER — Other Ambulatory Visit: Payer: Self-pay | Admitting: Family Medicine

## 2014-12-03 ENCOUNTER — Encounter: Payer: Self-pay | Admitting: Family Medicine

## 2014-12-03 ENCOUNTER — Ambulatory Visit (INDEPENDENT_AMBULATORY_CARE_PROVIDER_SITE_OTHER): Payer: Medicare Other | Admitting: Family Medicine

## 2014-12-03 VITALS — BP 90/70 | HR 94 | Temp 98.7°F | Ht 69.0 in | Wt 216.5 lb

## 2014-12-03 DIAGNOSIS — G894 Chronic pain syndrome: Secondary | ICD-10-CM | POA: Diagnosis not present

## 2014-12-03 DIAGNOSIS — M549 Dorsalgia, unspecified: Secondary | ICD-10-CM | POA: Diagnosis not present

## 2014-12-03 DIAGNOSIS — M25551 Pain in right hip: Secondary | ICD-10-CM | POA: Diagnosis not present

## 2014-12-03 DIAGNOSIS — G35 Multiple sclerosis: Secondary | ICD-10-CM

## 2014-12-03 DIAGNOSIS — M25552 Pain in left hip: Secondary | ICD-10-CM | POA: Diagnosis not present

## 2014-12-03 DIAGNOSIS — G8929 Other chronic pain: Secondary | ICD-10-CM

## 2014-12-03 DIAGNOSIS — M542 Cervicalgia: Secondary | ICD-10-CM | POA: Diagnosis not present

## 2014-12-03 MED ORDER — HYDROCODONE-ACETAMINOPHEN 10-325 MG PO TABS: 1.0000 | ORAL_TABLET | Freq: Four times a day (QID) | ORAL | Status: DC | PRN

## 2014-12-03 NOTE — Progress Notes (Signed)
Dr. Frederico Hamman T. Halah Whiteside, MD, [facility] Medicine [place], [address] Phone: [phone] Fax: [phone]  12/03/2014  Patient: Emily Moon, MRN: [id number], DOB: 1961/10/19, 54 y.o.  Primary Physician:  Arnette Norris, MD  Chief Complaint: Hip Pain and Ear Pain  Subjective:   Emily Moon is a 54 y.o. very pleasant female patient who presents with the following:  Pleasant patient who I remember well who has a history of bilateral hip dysplasia, status post left-sided total hip arthroplasty, and she describes an event within the last couple of days where she heard a pop in her RIGHT hip, she felt something shift, and "her head when out."  She is subsequently developed quite a bit of pain with ambulation and with movement about the RIGHT hip, and also has pain laterally.  She also has pain laterally about her incision for her left-sided lateral hip adjacent to her arthroplasty.  She also has ongoing chronic neck and back pain, and this is been a ongoing issue for her.  She does to have a chronic narcotic contract with my partner, though she is not refilled her medication since November.  Prior to then, she was doing relatively well, and she was able to go on a trip out [place] with one of her friends.  Look in Left ear.   "Hip went out" on the right, heard a pop.   Cervical and lumbar spine.  Down the right side some.  Neck causing     Past Medical History, Surgical History, Social History, Family History, Problem List, Medications, and Allergies have been reviewed and updated if relevant.  GEN: No fevers, chills. Nontoxic. Primarily MSK c/o today. Left ear pain MSK: Detailed in the HPI GI: tolerating PO intake without difficulty Neuro: No numbness, parasthesias, or tingling associated. Otherwise the pertinent positives of the ROS are noted above.   Objective:   BP 90/70 mmHg  Pulse 94  Temp(Src) 98.7 F (37.1 C)  (Oral)  Ht 5\' 9"  (1.753 m)  Wt 216 lb 8 oz (98.204 kg)  BMI 31.96 kg/m2   GEN: WDWN, NAD, Non-toxic, Alert & Oriented x 3 HEENT: Atraumatic, Normocephalic.  Ears and Nose: No external deformity. EXTR: No clubbing/cyanosis/edema NEURO: mildly antalgic gait PSYCH: Normally interactive. Conversant. Not depressed or anxious appearing.  Calm demeanor.    Cervical spine: There is a modest, approximately 15% loss of motion.  Relatively diffuse paracervical spine musculature.  C5-T1 are intact bilaterally.  She is diffusely tender posteriorly throughout the entire paraspinal musculature from the thoracic spine all the way through the base of the lumbar spine.  There is some significant amount of spasm.  On the RIGHT hip she does have pain with approximately 25-30 of abduction.  She is able to rotate the hip, but given her history, I do not aggressively try to internally or externally rotate her hip.  She has minimal to mild trochanteric bursitis bilaterally.  Strength is improved compared to my last examination.  Hip flexion, abduction, and abduction are all 4+ plus/5 now.  Radiology: No results found.  Assessment and Plan:   Chronic back pain  Chronic neck pain  Right hip pain  Left hip pain  Chronic pain syndrome  MULTIPLE SCLEROSIS, PROGRESSIVE/RELAPSING  >25 minutes spent in face to face time with patient, >50% spent in counselling or coordination of care  I suspect that she subluxed her RIGHT hip.  Recommended rest relatively over the next 2-3  weeks, and this likely will calm down some.  His may have exacerbated some of her ongoing back and neck problems.  Her trochanteric bursitis is relatively mild right now.  She also had an episode where she became manic on some steroids earlier in the year, so I would rather not treat these with corticosteroids at this time.  Chronic neck and back pain appear to be flared up.  She has not done any chiropractic visits in a long time, and  in the past she had great success with traction.  I think that this is a very good idea, and I suggested that she do this either through chiropractic or physical therapy, depending on her insurance coverage.  She will call me if she needs any kind of assistance a referral with this.  Refilled chronic narcotics.  Patient Instructions  Chiropractor: Dr. Grant Fontana, [place] - in [facility]  Physical Therapy: [facility] on [facility] in [place],  Leshara T. Affan Callow, MD   Patient's Medications  New Prescriptions   No medications on file  Previous Medications   ASPIRIN 325 MG TABLET    Take 325 mg by mouth as needed.   BIOTIN PO    Take 2,000 mg by mouth daily.    CALCIPOTRIENE-BETAMETHASONE (TACLONEX) OINTMENT    Apply topically daily.   CALCIUM CARBONATE (TUMS - DOSED IN MG ELEMENTAL CALCIUM) 500 MG CHEWABLE TABLET    Chew 3 tablets by mouth daily.   CETIRIZINE (ZYRTEC) 10 MG TABLET    Take 10 mg by mouth daily.   CHOLECALCIFEROL (VITAMIN D-3 PO)    Take 10,000 Units by mouth daily.    CLOBETASOL CREAM (TEMOVATE) 0.05 %    Apply 1 application topically 2 (two) times daily.   DESONIDE (DESOWEN) 0.05 % CREAM       DICLOFENAC SODIUM (VOLTAREN) 1 % GEL    Apply 4 g topically 4 (four) times daily.   DULOXETINE HCL (CYMBALTA PO)    Take 40 mg by mouth daily.   FLUTICASONE (FLONASE) 50 MCG/ACT NASAL SPRAY    Place into both nostrils daily.   HYDROXYZINE (ATARAX/VISTARIL) 25 MG TABLET    TAKE TWO TABLETS BY MOUTH NIGHTLY AT BEDTIME    LAMOTRIGINE (LAMICTAL) 150 MG TABLET    Take two tablets by mouth daily   LEVALBUTEROL (XOPENEX HFA) 45 MCG/ACT INHALER    Inhale 1-2 puffs into the lungs every 4 (four) hours as needed.   LYSINE 1000 MG TABS    Take 4,000 mg by mouth daily.    MIRABEGRON ER (MYRBETRIQ) 25 MG TB24 TABLET    Take 1 tablet (25 mg total) by mouth daily.   NICOTINE (NICODERM CQ - DOSED IN MG/24 HOURS) 21 MG/24HR PATCH    Place 21 mg  onto the skin daily.   OXYCODONE (OXY-IR) 5 MG CAPSULE    Take 1 capsule (5 mg total) by mouth every 4 (four) hours as needed.   POLYETHYLENE GLYCOL POWDER (GLYCOLAX/MIRALAX) POWDER    Take 17 g by mouth 2 (two) times daily as needed.   TEMAZEPAM (RESTORIL) 30 MG CAPSULE    TAKE ONE TO TWO CAPSULES BY MOUTH NIGHTLY AT BEDTIME AS NEEDED    TIZANIDINE (ZANAFLEX) 4 MG TABLET    TAKE ONE TABLET BY MOUTH EVERY SIX HOURS AS NEEDED   Modified Medications   Modified Medication Previous Medication   HYDROCODONE-ACETAMINOPHEN (NORCO) 10-325 MG PER TABLET HYDROcodone-acetaminophen (NORCO) 10-325 MG per tablet  Take 1 tablet by mouth every 6 (six) hours as needed.    Take 1 tablet by mouth every 6 (six) hours as needed.  Discontinued Medications   BREXPIPRAZOLE (REXULTI) 0.5 MG TABS    Take by mouth.   DULOXETINE (CYMBALTA) 20 MG CAPSULE    Take 40 mg by mouth daily.   PROMETHAZINE (PHENERGAN) 12.5 MG TABLET    Take 1 tablet (12.5 mg total) by mouth every 8 (eight) hours as needed for nausea or vomiting.

## 2014-12-03 NOTE — Progress Notes (Signed)
Pre visit review using our clinic review tool, if applicable. No additional management support is needed unless otherwise documented below in the visit note. 

## 2014-12-03 NOTE — Patient Instructions (Signed)
Chiropractor: Dr. Grant Fontana, [place] - in [facility]  Physical Therapy: [facility] on [facility] in [place]

## 2014-12-05 ENCOUNTER — Ambulatory Visit (INDEPENDENT_AMBULATORY_CARE_PROVIDER_SITE_OTHER): Payer: Medicare Other | Admitting: Psychology

## 2014-12-05 DIAGNOSIS — F3181 Bipolar II disorder: Secondary | ICD-10-CM

## 2014-12-25 ENCOUNTER — Ambulatory Visit (INDEPENDENT_AMBULATORY_CARE_PROVIDER_SITE_OTHER): Payer: 59 | Admitting: Psychology

## 2014-12-25 DIAGNOSIS — F3181 Bipolar II disorder: Secondary | ICD-10-CM

## 2014-12-27 DIAGNOSIS — M546 Pain in thoracic spine: Secondary | ICD-10-CM | POA: Diagnosis not present

## 2014-12-27 DIAGNOSIS — M9903 Segmental and somatic dysfunction of lumbar region: Secondary | ICD-10-CM | POA: Diagnosis not present

## 2014-12-27 DIAGNOSIS — M5413 Radiculopathy, cervicothoracic region: Secondary | ICD-10-CM | POA: Diagnosis not present

## 2014-12-27 DIAGNOSIS — M9902 Segmental and somatic dysfunction of thoracic region: Secondary | ICD-10-CM | POA: Diagnosis not present

## 2014-12-27 DIAGNOSIS — M9901 Segmental and somatic dysfunction of cervical region: Secondary | ICD-10-CM | POA: Diagnosis not present

## 2015-01-01 DIAGNOSIS — M9901 Segmental and somatic dysfunction of cervical region: Secondary | ICD-10-CM | POA: Diagnosis not present

## 2015-01-01 DIAGNOSIS — M546 Pain in thoracic spine: Secondary | ICD-10-CM | POA: Diagnosis not present

## 2015-01-01 DIAGNOSIS — M5413 Radiculopathy, cervicothoracic region: Secondary | ICD-10-CM | POA: Diagnosis not present

## 2015-01-01 DIAGNOSIS — M9903 Segmental and somatic dysfunction of lumbar region: Secondary | ICD-10-CM | POA: Diagnosis not present

## 2015-01-01 DIAGNOSIS — M9902 Segmental and somatic dysfunction of thoracic region: Secondary | ICD-10-CM | POA: Diagnosis not present

## 2015-01-03 DIAGNOSIS — M546 Pain in thoracic spine: Secondary | ICD-10-CM | POA: Diagnosis not present

## 2015-01-03 DIAGNOSIS — M5413 Radiculopathy, cervicothoracic region: Secondary | ICD-10-CM | POA: Diagnosis not present

## 2015-01-03 DIAGNOSIS — M9902 Segmental and somatic dysfunction of thoracic region: Secondary | ICD-10-CM | POA: Diagnosis not present

## 2015-01-03 DIAGNOSIS — M9901 Segmental and somatic dysfunction of cervical region: Secondary | ICD-10-CM | POA: Diagnosis not present

## 2015-01-03 DIAGNOSIS — M9903 Segmental and somatic dysfunction of lumbar region: Secondary | ICD-10-CM | POA: Diagnosis not present

## 2015-01-07 DIAGNOSIS — M9903 Segmental and somatic dysfunction of lumbar region: Secondary | ICD-10-CM | POA: Diagnosis not present

## 2015-01-07 DIAGNOSIS — M546 Pain in thoracic spine: Secondary | ICD-10-CM | POA: Diagnosis not present

## 2015-01-07 DIAGNOSIS — M9902 Segmental and somatic dysfunction of thoracic region: Secondary | ICD-10-CM | POA: Diagnosis not present

## 2015-01-07 DIAGNOSIS — M9901 Segmental and somatic dysfunction of cervical region: Secondary | ICD-10-CM | POA: Diagnosis not present

## 2015-01-07 DIAGNOSIS — M5413 Radiculopathy, cervicothoracic region: Secondary | ICD-10-CM | POA: Diagnosis not present

## 2015-01-08 ENCOUNTER — Ambulatory Visit (INDEPENDENT_AMBULATORY_CARE_PROVIDER_SITE_OTHER): Payer: 59 | Admitting: Psychology

## 2015-01-08 DIAGNOSIS — F3181 Bipolar II disorder: Secondary | ICD-10-CM

## 2015-01-10 DIAGNOSIS — M9901 Segmental and somatic dysfunction of cervical region: Secondary | ICD-10-CM | POA: Diagnosis not present

## 2015-01-10 DIAGNOSIS — M546 Pain in thoracic spine: Secondary | ICD-10-CM | POA: Diagnosis not present

## 2015-01-10 DIAGNOSIS — M5413 Radiculopathy, cervicothoracic region: Secondary | ICD-10-CM | POA: Diagnosis not present

## 2015-01-10 DIAGNOSIS — M9902 Segmental and somatic dysfunction of thoracic region: Secondary | ICD-10-CM | POA: Diagnosis not present

## 2015-01-10 DIAGNOSIS — M9903 Segmental and somatic dysfunction of lumbar region: Secondary | ICD-10-CM | POA: Diagnosis not present

## 2015-01-11 ENCOUNTER — Other Ambulatory Visit: Payer: Self-pay | Admitting: Family Medicine

## 2015-01-17 ENCOUNTER — Encounter: Payer: Self-pay | Admitting: Family Medicine

## 2015-01-17 ENCOUNTER — Ambulatory Visit (INDEPENDENT_AMBULATORY_CARE_PROVIDER_SITE_OTHER): Payer: Medicare Other | Admitting: Family Medicine

## 2015-01-17 VITALS — BP 104/78 | HR 101 | Temp 99.2°F | Ht 69.0 in | Wt 217.5 lb

## 2015-01-17 DIAGNOSIS — M9903 Segmental and somatic dysfunction of lumbar region: Secondary | ICD-10-CM | POA: Diagnosis not present

## 2015-01-17 DIAGNOSIS — M7061 Trochanteric bursitis, right hip: Secondary | ICD-10-CM

## 2015-01-17 DIAGNOSIS — M1631 Unilateral osteoarthritis resulting from hip dysplasia, right hip: Secondary | ICD-10-CM

## 2015-01-17 DIAGNOSIS — M25511 Pain in right shoulder: Secondary | ICD-10-CM

## 2015-01-17 DIAGNOSIS — M5413 Radiculopathy, cervicothoracic region: Secondary | ICD-10-CM | POA: Diagnosis not present

## 2015-01-17 DIAGNOSIS — G894 Chronic pain syndrome: Secondary | ICD-10-CM | POA: Diagnosis not present

## 2015-01-17 DIAGNOSIS — M9901 Segmental and somatic dysfunction of cervical region: Secondary | ICD-10-CM | POA: Diagnosis not present

## 2015-01-17 DIAGNOSIS — M9902 Segmental and somatic dysfunction of thoracic region: Secondary | ICD-10-CM | POA: Diagnosis not present

## 2015-01-17 DIAGNOSIS — M75101 Unspecified rotator cuff tear or rupture of right shoulder, not specified as traumatic: Secondary | ICD-10-CM | POA: Diagnosis not present

## 2015-01-17 DIAGNOSIS — G35 Multiple sclerosis: Secondary | ICD-10-CM

## 2015-01-17 DIAGNOSIS — M7541 Impingement syndrome of right shoulder: Secondary | ICD-10-CM

## 2015-01-17 DIAGNOSIS — M25552 Pain in left hip: Secondary | ICD-10-CM

## 2015-01-17 DIAGNOSIS — M546 Pain in thoracic spine: Secondary | ICD-10-CM | POA: Diagnosis not present

## 2015-01-17 MED ORDER — HYDROCODONE-ACETAMINOPHEN 10-325 MG PO TABS: 1.0000 | ORAL_TABLET | Freq: Four times a day (QID) | ORAL | Status: DC | PRN

## 2015-01-17 MED ORDER — METHYLPREDNISOLONE ACETATE 40 MG/ML IJ SUSP
80.0000 mg | Freq: Once | INTRAMUSCULAR | Status: AC
  Administered 2015-01-17: 80 mg via INTRA_ARTICULAR

## 2015-01-17 NOTE — Progress Notes (Signed)
Dr. Frederico Hamman T. Shafer Swamy, MD, [facility] Primary [facility] and Sports Medicine [place], [address] Phone: [phone] Fax: [phone]  01/17/2015  Patient: Emily Moon, MRN: [id number], DOB: 01/23/1961, 54 y.o.  Primary Physician:  Arnette Norris, MD  Chief Complaint: Back Pain; Hip Pain; and Shoulder Pain  Subjective:   Emily Moon is a 54 y.o. very pleasant female patient who presents with the following:  Patient is well known to me with a history of multiple sclerosis, chronic pain, bilateral hip dysplasia, status post total hip arthroplasty on the left with chronic pain, chronic back pain, and chronic opioid use.  Today she presents with shoulder pain, impingement type symptoms with pain with an arc of motion, pain in a T-shirt-type distribution.  No significant neck pain.  Some pain with internal range of motion.  No prior dislocation.  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  She also is having pain laterally on the right hip.  She has some known fairly significant right-sided hip osteoarthritis.  She also status post hip arthroplasty on the left.  Her gait is been off some secondary to back pain as well.  She's also gained some weight.  She has been having some manipulation and chiropractic treatments  Has been going to go see Grant Fontana.  Gained weight.   Driving next week to [place].   Right shoulder.  R hip.  Past Medical History, Surgical History, Social History, Family History, Problem List, Medications, and Allergies have been reviewed and updated if relevant.  GEN: No fevers, chills. Nontoxic. Primarily MSK c/o today. MSK: Detailed in the HPI GI: tolerating PO intake without difficulty Neuro: No numbness, parasthesias, or tingling associated. Otherwise the pertinent positives of the ROS are noted above.   Objective:   BP 104/78 mmHg  Pulse 101  Temp(Src) 99.2 F (37.3 C) (Oral)  Ht 5\' 9"  (1.753 m)  Wt 217 lb 8 oz  (98.657 kg)  BMI 32.10 kg/m2   GEN: Well-developed,well-nourished,in no acute distress; alert,appropriate and cooperative throughout examination HEENT: Normocephalic and atraumatic without obvious abnormalities. Ears, externally no deformities PULM: Breathing comfortably in no respiratory distress EXT: No clubbing, cyanosis, or edema PSYCH: Normally interactive. Cooperative during the interview. Pleasant. Friendly and conversant. Not anxious or depressed appearing. Normal, full affect.  Shoulder: R Inspection: No muscle wasting or winging Ecchymosis/edema: neg  AC joint, scapula, clavicle: NT Cervical spine: NT, full ROM Spurling's: neg Abduction: full, 5/5 Flexion: full, 5/5 IR, full, lift-off: 5/5 ER at neutral: full, 5/5 AC crossover: neg Neer: pos Hawkins: pos Drop Test: neg Empty Can: pos Supraspinatus insertion: mild-mod T Bicipital groove: NT Speed's: neg Yergason's: neg Sulcus sign: neg Scapular dyskinesis: none C5-T1 intact  Neuro: Sensation intact Grip 5/5   HIP EXAM: SIDE: R ROM: Abduction, Flexion, Internal and External range of motion: pain and limitation with abduction as well as internal and external range of motion.  Approximate 40% loss of motion.   Pain with terminal IROM and EROM: as above GTB: TTP SLR: NEG Knees: No effusion FABER: NT REVERSE FABER: NT, neg Piriformis: NT at direct palpation Str: flexion: 5/5 abduction: 4++/5 adduction: 5/5 Strength testing non-tender     Radiology: No results found.  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  These of limited some of her activity.  She understands that this is a cyclical process.  I recommended that she get back into the pool for activity and rehabilitation.  I think that this will help her core, back stability, hips, as well as her shoulder and rotator cuff.  For now, we'll try to calm down her rotator cuff, subacromial bursa, and her trochanteric bursa.  SubAC Injection, R Verbal consent was obtained from the patient. Risks (including rare infection), benefits, and alternatives were explained. Patient prepped with Chloraprep and Ethyl Chloride used for anesthesia. The subacromial space was injected using the posterior approach. The patient tolerated the procedure well and had decreased pain post injection. No complications. Injection: 8 cc of Lidocaine 1% and Depo-Medrol 80 mg. Needle: 22 gauge   Trochanteric Bursitis Injection, R Verbal consent obtained. Risks (including infection, potential atrophy), benefits, and alternatives reviewed. Greater trochanter sterilely prepped with Chloraprep. Ethyl Chloride used for anesthesia. 8 cc of Lidocaine 1% injected with Depo-Medrol 80 mg into trochanteric bursa at area of maximal tenderness at greater trochanter. Needle taken to bone to troch bursa, flows easily. Bursa massaged. No bleeding and no complications. Decreased pain after injection. Needle: 22 gauge spinal needle   Follow-up: prn  Signed,  Mariadel Mruk T. Jymir Dunaj, MD   Patient's Medications  New Prescriptions   No medications on file  Previous Medications   ASPIRIN 325 MG TABLET    Take 325 mg by mouth as needed.   BIOTIN PO    Take 2,000 mg by mouth daily.    CALCIPOTRIENE-BETAMETHASONE (TACLONEX) OINTMENT    Apply topically daily.   CALCIUM CARBONATE (TUMS - DOSED IN MG ELEMENTAL CALCIUM) 500 MG CHEWABLE TABLET    Chew 3 tablets by mouth daily.   CETIRIZINE (ZYRTEC) 10 MG TABLET    Take 10  mg by mouth daily.   CHOLECALCIFEROL (VITAMIN D-3 PO)    Take 10,000 Units by mouth daily.    CLOBETASOL CREAM (TEMOVATE) 0.05 %    Apply 1 application topically 2 (two) times daily.   DESONIDE (DESOWEN) 0.05 % CREAM       DICLOFENAC SODIUM (VOLTAREN) 1 % GEL    Apply 4 g topically 4 (four) times daily.   DULOXETINE (CYMBALTA) 60 MG CAPSULE    Take 60 mg by mouth daily.   FLUTICASONE (FLONASE) 50 MCG/ACT NASAL SPRAY    Place into both nostrils daily.   HYDROXYZINE (ATARAX/VISTARIL) 25 MG TABLET    TAKE TWO TABLETS BY MOUTH NIGHTLY AT BEDTIME    LAMOTRIGINE (LAMICTAL) 150 MG TABLET    Take two tablets by mouth daily   LEVALBUTEROL (XOPENEX HFA) 45 MCG/ACT INHALER    Inhale 1-2 puffs into the lungs every 4 (four) hours as needed.   LYSINE 1000 MG TABS    Take 4,000 mg by mouth daily.    MIRABEGRON ER (MYRBETRIQ) 25 MG TB24 TABLET    Take 1 tablet (25 mg total) by mouth daily.   NICOTINE (NICODERM CQ - DOSED IN MG/24 HOURS) 21 MG/24HR PATCH    Place 21 mg onto the skin daily.   POLYETHYLENE GLYCOL POWDER (GLYCOLAX/MIRALAX) POWDER    Take 17 g by mouth 2 (two) times daily as needed.   TEMAZEPAM (RESTORIL) 30 MG CAPSULE    TAKE ONE TO TWO  CAPSULES BY MOUTH NIGHTLY AT BEDTIME AS NEEDED    TIZANIDINE (ZANAFLEX) 4 MG TABLET    TAKE ONE TABLET BY MOUTH EVERY SIX HOURS AS NEEDED   Modified Medications   Modified Medication Previous Medication   HYDROCODONE-ACETAMINOPHEN (NORCO) 10-325 MG PER TABLET HYDROcodone-acetaminophen (NORCO) 10-325 MG per tablet      Take 1 tablet by mouth every 6 (six) hours as needed.    Take 1 tablet by mouth every 6 (six) hours as needed.  Discontinued Medications   DULOXETINE HCL (CYMBALTA PO)    Take 40 mg by mouth daily.   OXYCODONE (OXY-IR) 5 MG CAPSULE    Take 1 capsule (5 mg total) by mouth every 4 (four) hours as needed.

## 2015-01-17 NOTE — Progress Notes (Signed)
Pre visit review using our clinic review tool, if applicable. No additional management support is needed unless otherwise documented below in the visit note. 

## 2015-01-22 ENCOUNTER — Ambulatory Visit: Payer: 59 | Admitting: Psychology

## 2015-02-11 ENCOUNTER — Other Ambulatory Visit: Payer: Self-pay | Admitting: Family Medicine

## 2015-03-11 ENCOUNTER — Encounter: Payer: Self-pay | Admitting: Family Medicine

## 2015-03-11 ENCOUNTER — Ambulatory Visit (INDEPENDENT_AMBULATORY_CARE_PROVIDER_SITE_OTHER): Payer: Medicare Other | Admitting: Family Medicine

## 2015-03-11 VITALS — BP 120/80 | HR 106 | Temp 99.0°F | Ht 69.0 in | Wt 218.8 lb

## 2015-03-11 DIAGNOSIS — G35 Multiple sclerosis: Secondary | ICD-10-CM

## 2015-03-11 DIAGNOSIS — M7062 Trochanteric bursitis, left hip: Secondary | ICD-10-CM | POA: Diagnosis not present

## 2015-03-11 DIAGNOSIS — M25552 Pain in left hip: Secondary | ICD-10-CM | POA: Diagnosis not present

## 2015-03-11 DIAGNOSIS — B360 Pityriasis versicolor: Secondary | ICD-10-CM | POA: Diagnosis not present

## 2015-03-11 MED ORDER — FLUCONAZOLE 150 MG PO TABS: ORAL_TABLET | ORAL | Status: DC

## 2015-03-11 MED ORDER — CLOBETASOL PROPIONATE 0.05 % EX CREA: 1.0000 "application " | TOPICAL_CREAM | Freq: Two times a day (BID) | CUTANEOUS | Status: DC

## 2015-03-11 MED ORDER — METHYLPREDNISOLONE ACETATE 40 MG/ML IJ SUSP
80.0000 mg | Freq: Once | INTRAMUSCULAR | Status: AC
  Administered 2015-03-11: 80 mg via INTRA_ARTICULAR

## 2015-03-11 MED ORDER — TIZANIDINE HCL 4 MG PO TABS
4.0000 mg | ORAL_TABLET | Freq: Four times a day (QID) | ORAL | Status: DC | PRN
Start: 2015-03-11 — End: 2015-07-15

## 2015-03-11 MED ORDER — HYDROCODONE-ACETAMINOPHEN 10-325 MG PO TABS: 1.0000 | ORAL_TABLET | Freq: Four times a day (QID) | ORAL | Status: DC | PRN

## 2015-03-11 NOTE — Progress Notes (Signed)
Dr. Frederico Hamman T. Rosaelena Kemnitz, MD, [facility] Primary [facility] and Sports Medicine [place], [address] Phone: [phone] Fax: [phone]  03/11/2015  Patient: Emily Moon, MRN: [id number], DOB: [birth year], 54 y.o.  Primary Physician:  Arnette Norris, MD  Chief Complaint: Rash; Hip Pain; and Wants Note for Jury Duty  Subjective:   Emily Moon is a 54 y.o. very pleasant female patient who presents with the following:  Jury duty. MS, severe overactive bladder, chronic back and hip pain requiring standing every 30 minutes and 30 minute bathroom breaks.   L GTB inj, s/p THA, again has GTB. Limited with ability to get exercise from MS, chronic pain.  Flat rash on neck  Past Medical History, Surgical History, Social History, Family History, Problem List, Medications, and Allergies have been reviewed and updated if relevant.   GEN: No acute illnesses, no fevers, chills. GI: No n/v/d, eating normally Pulm: No SOB Interactive and getting along well at home.  Otherwise, ROS is as per the HPI.  Objective:   BP 120/80 mmHg  Pulse 106  Temp(Src) 99 F (37.2 C) (Oral)  Ht 5\' 9"  (1.753 m)  Wt 218 lb 12 oz (99.224 kg)  BMI 32.29 kg/m2  LMP 08/23/2014  GEN: WDWN, NAD, Non-toxic, A & O x 3 HEENT: Atraumatic, Normocephalic. Neck supple. No masses, No LAD. Ears and Nose: No external deformity. CV: RRR, No M/G/R. No JVD. No thrill. No extra heart sounds. PULM: CTA B, no wheezes, crackles, rhonchi. No retractions. No resp. distress. No accessory muscle use. EXTR: No c/c/e NEURO Normal gait.  PSYCH: Normally interactive. Conversant. Not depressed or anxious appearing.  Calm demeanor.   L GTB is TTP, glute medius  Insertion as well. Post THA motion is stable on the L  SKIN: flat, mildly scaly rash on neck  Laboratory and Imaging Data:  Assessment and Plan:   Tinea versicolor  Left hip pain - Plan: methylPREDNISolone acetate (DEPO-MEDROL) injection  80 mg  Trochanteric bursitis of left hip  MULTIPLE SCLEROSIS, PROGRESSIVE/RELAPSING  Treat tinea versicolor + [place] blue in the shower.  I agree to write her a jury duty note for multiple issues.  L GTB. Increase activity would likely help, but she is relatively limited   Trochanteric Bursitis Injection, LEFT Verbal consent obtained. Risks (including infection, potential atrophy), benefits, and alternatives reviewed. Greater trochanter sterilely prepped with Chloraprep. Ethyl Chloride used for anesthesia. 8 cc of Lidocaine 1% injected with Depo-Medrol 80 mg into trochanteric bursa at area of maximal tenderness at greater trochanter. Needle taken to bone to troch bursa, flows easily. Bursa massaged. No bleeding and no complications. Decreased pain after injection. Needle: 22 gauge spinal needle   Follow-up: prn  New Prescriptions   FLUCONAZOLE (DIFLUCAN) 150 MG TABLET    2 tabs po now and repeat in 2 weeks   No orders of the defined types were placed in this encounter.    Signed,  Maud Deed. Rajveer Handler, MD   Patient's Medications  New Prescriptions   FLUCONAZOLE (DIFLUCAN) 150 MG TABLET    2 tabs po now and repeat in 2 weeks  Previous Medications   ASPIRIN 325 MG TABLET    Take 325 mg by mouth as needed.   BIOTIN PO    Take 2,000 mg by mouth daily.    CALCIPOTRIENE-BETAMETHASONE (TACLONEX) OINTMENT    Apply topically daily.   CALCIUM CARBONATE (TUMS - DOSED IN MG ELEMENTAL CALCIUM) 500 MG CHEWABLE TABLET  Chew 3 tablets by mouth daily.   CETIRIZINE (ZYRTEC) 10 MG TABLET    Take 10 mg by mouth daily.   CHOLECALCIFEROL (VITAMIN D-3 PO)    Take 10,000 Units by mouth daily.    DESONIDE (DESOWEN) 0.05 % CREAM       DICLOFENAC SODIUM (VOLTAREN) 1 % GEL    Apply 4 g topically 4 (four) times daily.   DULOXETINE (CYMBALTA) 60 MG CAPSULE    Take 60 mg by mouth daily.   FLUTICASONE (FLONASE) 50 MCG/ACT NASAL SPRAY    Place into both nostrils daily.   HYDROXYZINE (ATARAX/VISTARIL) 25  MG TABLET    TAKE TWO TABLETS BY MOUTH NIGHTLY AT BEDTIME    LAMOTRIGINE (LAMICTAL) 150 MG TABLET    Take two tablets by mouth daily   LEVALBUTEROL (XOPENEX HFA) 45 MCG/ACT INHALER    Inhale 1-2 puffs into the lungs every 4 (four) hours as needed.   LYSINE 1000 MG TABS    Take 4,000 mg by mouth daily.    MIRABEGRON ER (MYRBETRIQ) 25 MG TB24 TABLET    Take 1 tablet (25 mg total) by mouth daily.   NICOTINE (NICODERM CQ - DOSED IN MG/24 HOURS) 21 MG/24HR PATCH    Place 21 mg onto the skin daily.   PHENAZOPYRIDINE HCL (AZO TABS PO)    Take 1 tablet by mouth as needed.   POLYETHYLENE GLYCOL POWDER (GLYCOLAX/MIRALAX) POWDER    Take 17 g by mouth 2 (two) times daily as needed.   PROBIOTIC PRODUCT (PROBIOTIC PO)    Take 1 tablet by mouth daily.   RANITIDINE (ZANTAC) 150 MG TABLET    Take 150 mg by mouth 2 (two) times daily as needed for heartburn.   TEMAZEPAM (RESTORIL) 30 MG CAPSULE    TAKE ONE TO TWO CAPSULES BY MOUTH NIGHTLY AT BEDTIME AS NEEDED    TURMERIC PO    Take 2 tablets by mouth daily.  Modified Medications   Modified Medication Previous Medication   CLOBETASOL CREAM (TEMOVATE) 0.05 % clobetasol cream (TEMOVATE) 0.05 %      Apply 1 application topically 2 (two) times daily.    Apply 1 application topically 2 (two) times daily.   HYDROCODONE-ACETAMINOPHEN (NORCO) 10-325 MG PER TABLET HYDROcodone-acetaminophen (NORCO) 10-325 MG per tablet      Take 1 tablet by mouth every 6 (six) hours as needed.    Take 1 tablet by mouth every 6 (six) hours as needed.   TIZANIDINE (ZANAFLEX) 4 MG TABLET tiZANidine (ZANAFLEX) 4 MG tablet      Take 1 tablet (4 mg total) by mouth every 6 (six) hours as needed.    TAKE ONE TABLET BY MOUTH EVERY SIX HOURS AS NEEDED   Discontinued Medications   No medications on file

## 2015-03-11 NOTE — Progress Notes (Signed)
Pre visit review using our clinic review tool, if applicable. No additional management support is needed unless otherwise documented below in the visit note. 

## 2015-03-13 ENCOUNTER — Telehealth: Payer: Self-pay

## 2015-03-13 NOTE — Telephone Encounter (Signed)
Emily Moon [phone] or [phone]  Darcey left a [employer] Sum[employer] form to be filled out, she would like to pick it up on Monday if at all possible. Placed forms on your desk.

## 2015-03-14 NOTE — Telephone Encounter (Signed)
Spoke to pt and advised per Dr Deborra Medina. Pt verbally expressed understanding. Incomplete form placed at front desk for pt to retreive.

## 2015-03-14 NOTE — Telephone Encounter (Signed)
Spoke to pt who states that she was wanting to give the form to Dr Lorelei Pont for completion. Pt states she is unable to sit for longer than 50min, and also has bladder issues and would be unable to use the restroom while in court. Pt is wanting the form given back to Dr Lorelei Pont.

## 2015-03-14 NOTE — Telephone Encounter (Signed)
Unclear what she is asking for.  Is she asking for jury summons excuse note from me?  If so, I do not see why she should be excused from [employer] based on her current medical state.

## 2015-03-14 NOTE — Telephone Encounter (Signed)
I do not think I can do this. She can call the courthouse and ask what she needs to do to request this.

## 2015-03-14 NOTE — Telephone Encounter (Signed)
Pt states that since she is unable to be exempt, she is wanting a letter written to excuse her to use the restroom prn, and that she is needing a special chair for comfort or will need to be able to stand up prn. I advised pt I didn't know if this was a feasible, but I would make the request on her behalf

## 2015-03-14 NOTE — Telephone Encounter (Signed)
Unfortunately these issues would not qualify her to be exempt from jury duty.

## 2015-04-22 ENCOUNTER — Encounter: Payer: Self-pay | Admitting: Family Medicine

## 2015-04-22 ENCOUNTER — Other Ambulatory Visit: Payer: Self-pay | Admitting: Family Medicine

## 2015-04-22 ENCOUNTER — Ambulatory Visit (INDEPENDENT_AMBULATORY_CARE_PROVIDER_SITE_OTHER)
Admission: RE | Admit: 2015-04-22 | Discharge: 2015-04-22 | Disposition: A | Discharge status: Home / Self Care | Payer: Medicare Other | Source: Ambulatory Visit | Attending: Family Medicine | Admitting: Family Medicine

## 2015-04-22 ENCOUNTER — Ambulatory Visit (INDEPENDENT_AMBULATORY_CARE_PROVIDER_SITE_OTHER): Payer: Medicare Other | Admitting: Family Medicine

## 2015-04-22 VITALS — BP 114/78 | HR 104 | Temp 99.1°F | Ht 69.0 in | Wt 215.8 lb

## 2015-04-22 DIAGNOSIS — Q6589 Other specified congenital deformities of hip: Secondary | ICD-10-CM | POA: Diagnosis not present

## 2015-04-22 DIAGNOSIS — Z471 Aftercare following joint replacement surgery: Secondary | ICD-10-CM | POA: Diagnosis not present

## 2015-04-22 DIAGNOSIS — Z96641 Presence of right artificial hip joint: Secondary | ICD-10-CM | POA: Diagnosis not present

## 2015-04-22 DIAGNOSIS — M1611 Unilateral primary osteoarthritis, right hip: Secondary | ICD-10-CM

## 2015-04-22 DIAGNOSIS — G35 Multiple sclerosis: Secondary | ICD-10-CM

## 2015-04-22 DIAGNOSIS — G894 Chronic pain syndrome: Secondary | ICD-10-CM

## 2015-04-22 DIAGNOSIS — G35D Multiple sclerosis, unspecified: Secondary | ICD-10-CM

## 2015-04-22 IMAGING — CR DG HIP (WITH OR WITHOUT PELVIS) 2-3V*R*
3 series · 3 of 3 positions shown · non-contrast
Comparison: None.

CLINICAL DATA: Primary osteoarthritis right hip.  Right hip pain

EXAM:
RIGHT HIP (WITH PELVIS) 2-3 VIEWS

[view not recorded (1 of 3)]
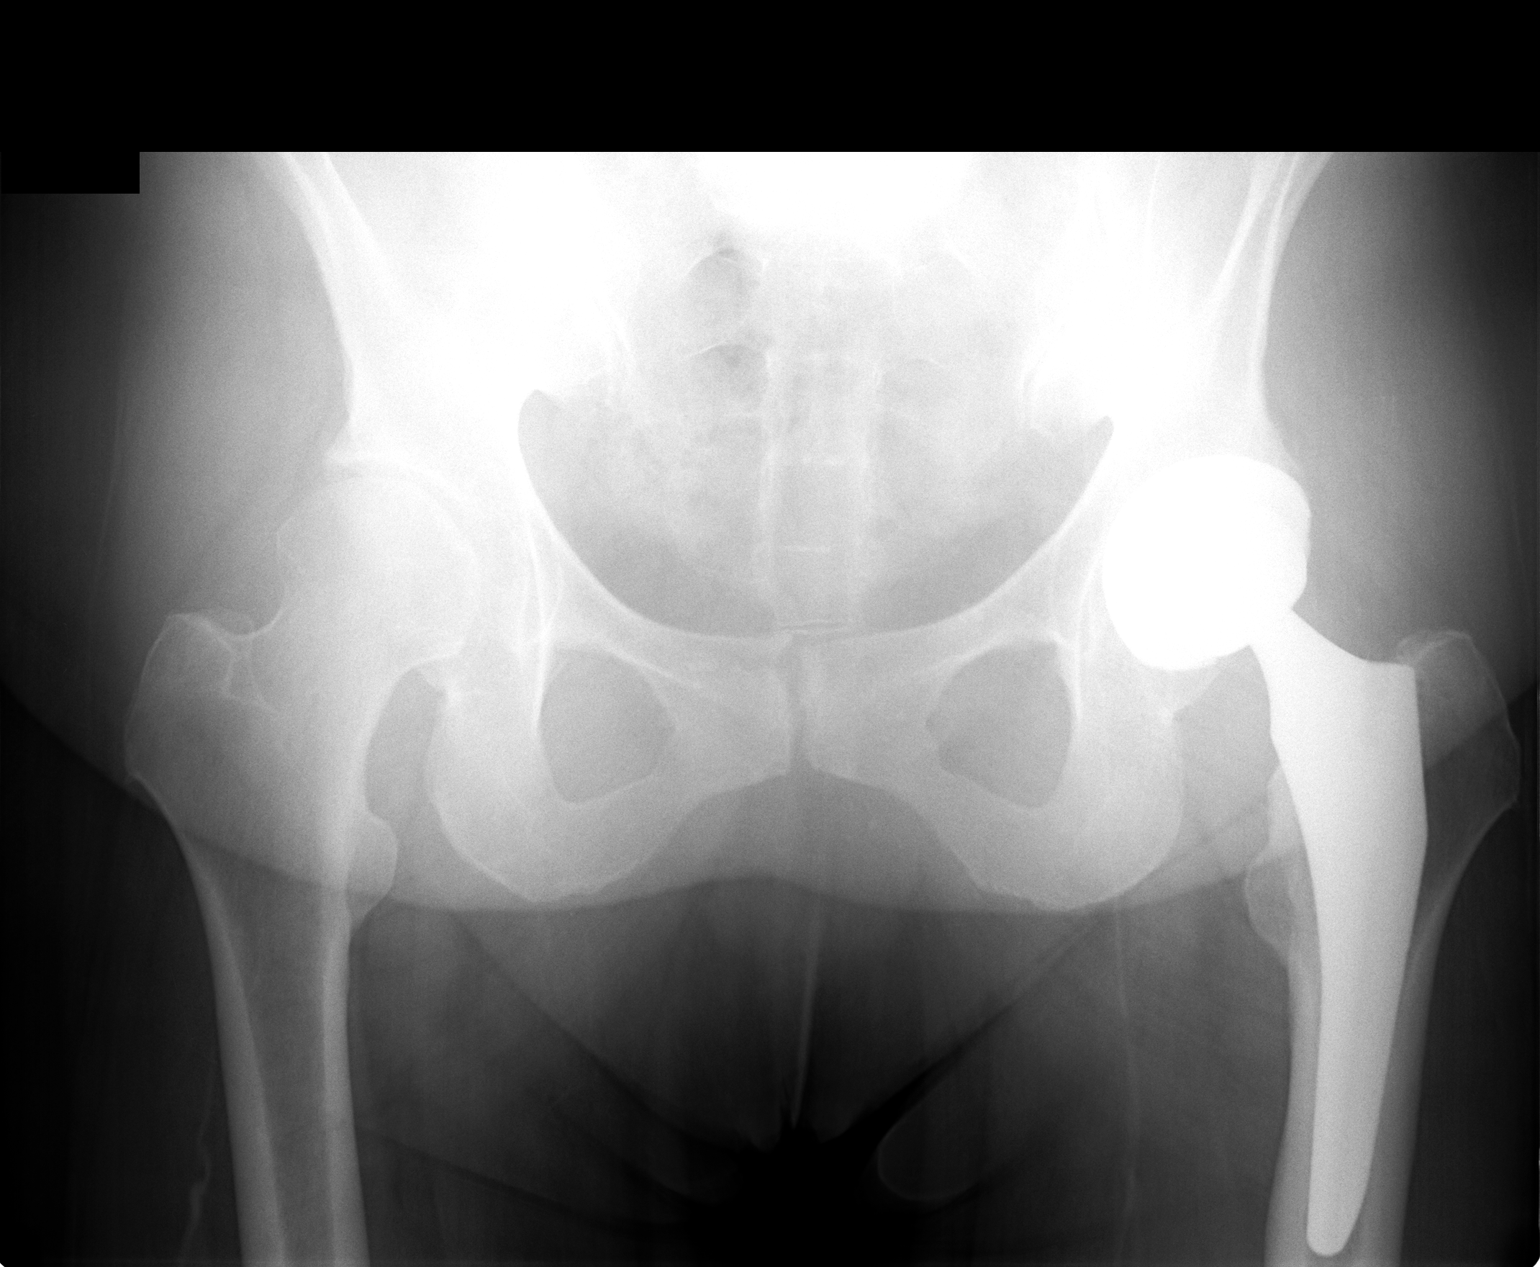

[view not recorded (2 of 3)]
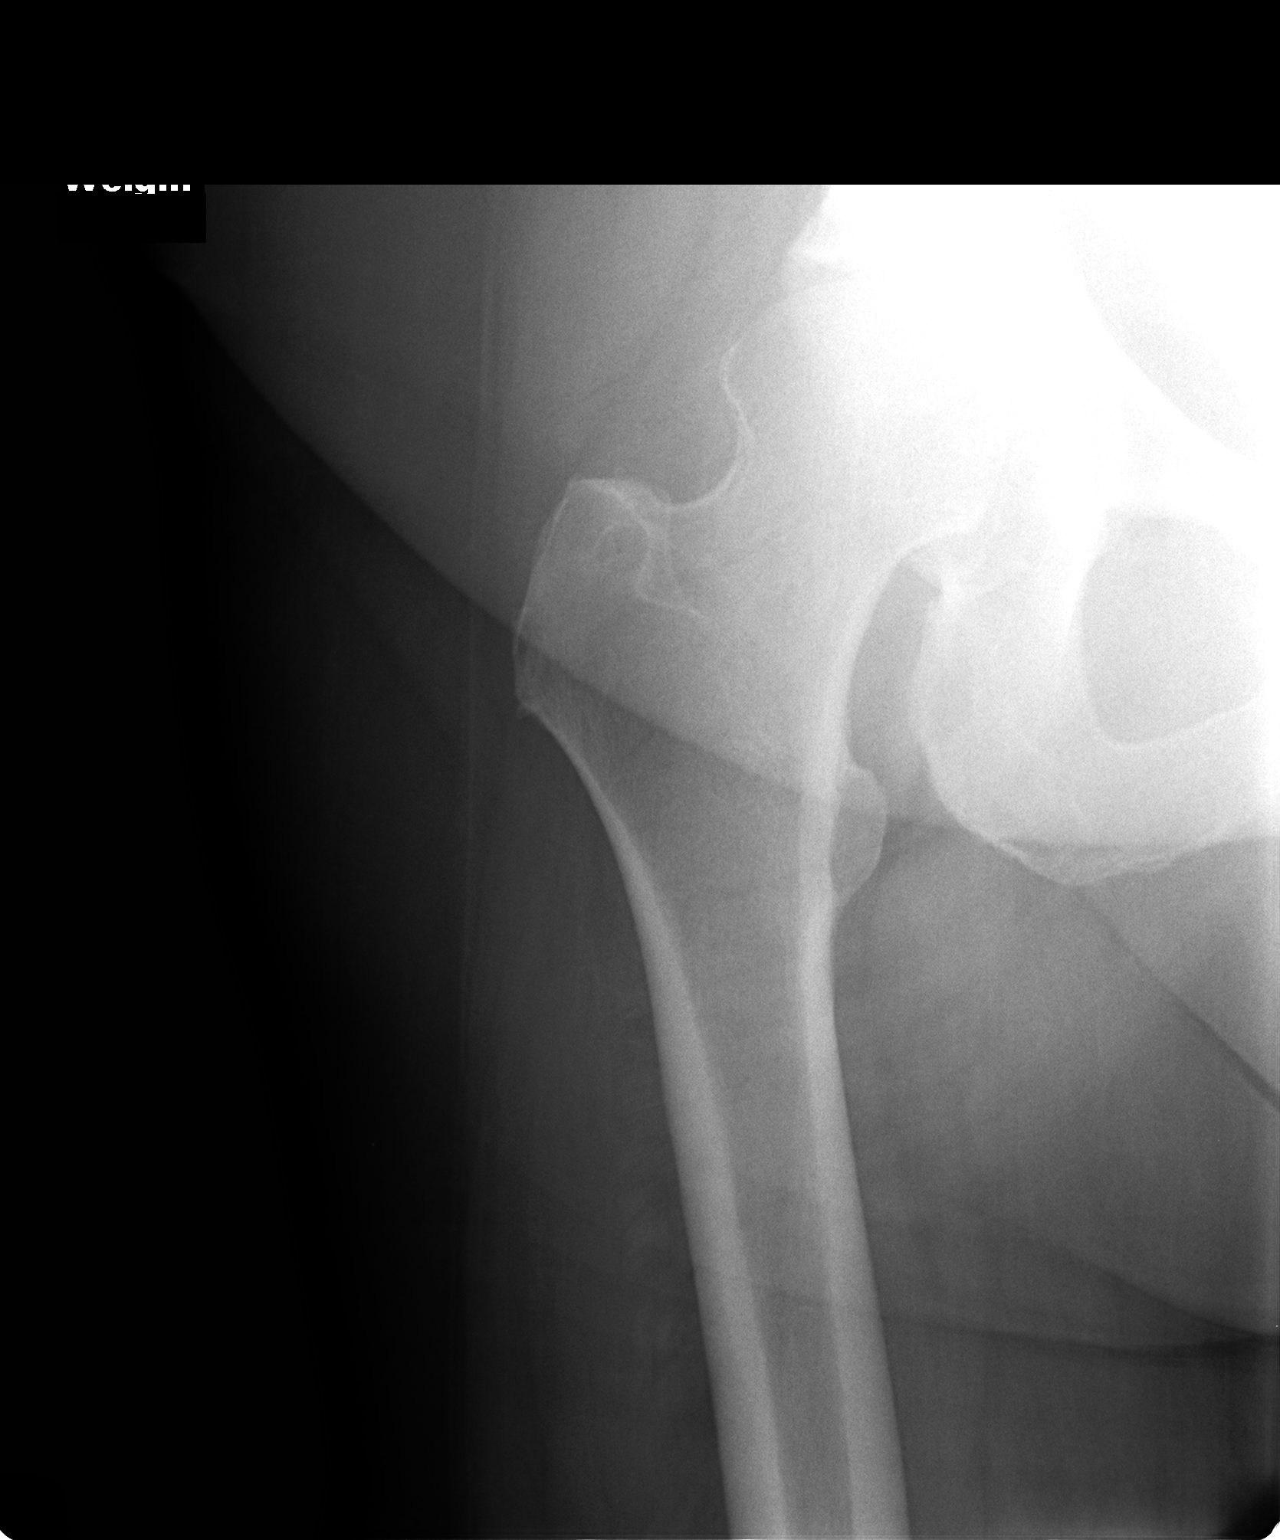

[view not recorded (3 of 3)]
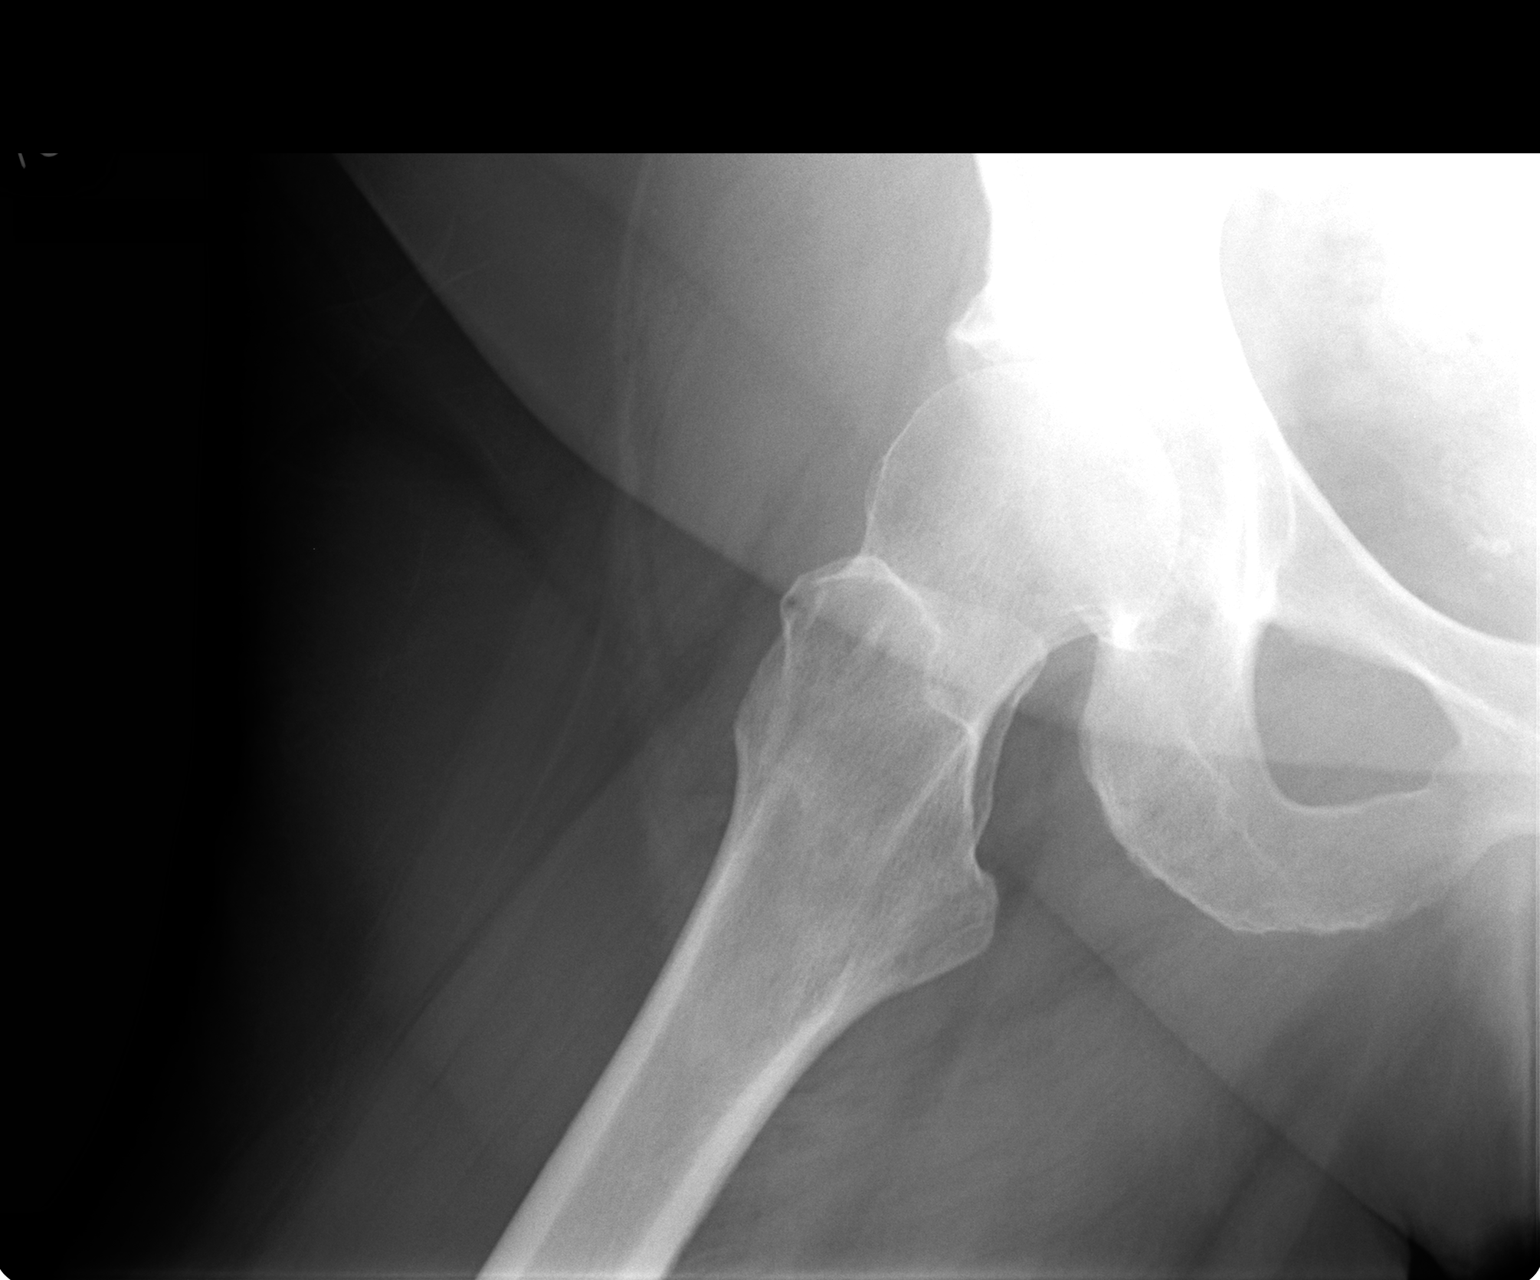

[3 of 3 positions shown; findings below may reference images not displayed]

FINDINGS: Mild joint space narrowing right hip. Negative for fracture or AVN.
No significant spurring.

Left hip replacement in satisfactory position and alignment. No
acute bony abnormality in the pelvis.
IMPRESSION: Mild degenerative change right hip joint without acute bony
abnormality.

Left hip replacement without complication.

## 2015-04-22 MED ORDER — HYDROCODONE-ACETAMINOPHEN 10-325 MG PO TABS: 1.0000 | ORAL_TABLET | Freq: Four times a day (QID) | ORAL | Status: DC | PRN

## 2015-04-22 NOTE — Progress Notes (Signed)
Pre visit review using our clinic review tool, if applicable. No additional management support is needed unless otherwise documented below in the visit note. 

## 2015-04-22 NOTE — Progress Notes (Signed)
Dr. Frederico Hamman T. Arcadio Cope, MD, [facility] Primary [facility] and Sports Medicine [place], [address] Phone: [phone] Fax: [phone]  04/22/2015  Patient: Emily Moon, MRN: [id number], DOB: February 19, 1961, 54 y.o.  Primary Physician:  Arnette Norris, MD  Chief Complaint: Hip Pain and lump in leg  Subjective:   Emily Moon is a 54 y.o. very pleasant female patient who presents with the following:  Check about jury duty letter.   Left leg: small area moveable.   Seeing a lady dermatologist. History of Tinea versicolor.   Has neck pain almost all the time.   Wt Readings from Last 3 Encounters:  04/22/15 215 lb 12 oz (97.864 kg)  03/11/15 218 lb 12 oz (99.224 kg)  01/17/15 217 lb 8 oz (98.657 kg)    Leaving on Thursday and going to [place] and back next week to [place].   Past Medical History, Surgical History, Social History, Family History, Problem List, Medications, and Allergies have been reviewed and updated if relevant.  GEN: No fevers, chills. Nontoxic. Primarily MSK c/o today. MSK: Detailed in the HPI GI: tolerating PO intake without difficulty Neuro: No numbness, parasthesias, or tingling associated. Otherwise the pertinent positives of the ROS are noted above.   Objective:   BP 114/78 mmHg  Pulse 104  Temp(Src) 99.1 F (37.3 C) (Oral)  Ht 5\' 9"  (1.753 m)  Wt 215 lb 12 oz (97.864 kg)  BMI 31.85 kg/m2  SpO2 98%  LMP 08/23/2014   GEN: WDWN, NAD, Non-toxic, Alert & Oriented x 3 HEENT: Atraumatic, Normocephalic.  Ears and Nose: No external deformity. EXTR: No clubbing/cyanosis/edema NEURO: Normal gait, antalgia PSYCH: Normally interactive. Conversant. Not depressed or anxious appearing.  Calm demeanor.   HIP EXAM: SIDE: R  ROM: Abduction, Flexion, Internal and External range of motion: Mild to moderate restriction with terminal abduction. Terminal internal range of motion is painful and mildly restricted. External range of motion  is grossly normal. Pain with terminal IROM and EROM: mild, as above GTB: mild-mod SLR: NEG Knees: No effusion FABER: NT REVERSE FABER: NT, neg Piriformis: NT at direct palpation Str: flexion: 5/5 abduction: 4+/5 adduction: 5/5 Strength testing non-tender     Radiology: Dg Hip Unilat With Pelvis 2-3 Views Right  04/22/2015   CLINICAL DATA:  Primary osteoarthritis right hip.  Right hip pain  EXAM: RIGHT HIP (WITH PELVIS) 2-3 VIEWS  COMPARISON:  None.  FINDINGS: Mild joint space narrowing right hip. Negative for fracture or AVN. No significant spurring.  Left hip replacement in satisfactory position and alignment. No acute bony abnormality in the pelvis.  IMPRESSION: Mild degenerative change right hip joint without acute bony abnormality.  Left hip replacement without complication.   Electronically Signed   By: Franchot Gallo M.D.   On: 04/22/2015 16:22     Assessment and Plan:   Primary osteoarthritis of right hip - Plan: DG HIP UNILAT WITH PELVIS 2-3 VIEWS RIGHT  Hip dysplasia, congenital  Chronic pain syndrome  MULTIPLE SCLEROSIS, PROGRESSIVE/RELAPSING  >25 minutes spent in face to face time with patient, >50% spent in counselling or coordination of care  The patient was concerned that she may need a total hip arthroplasty on the right in the very near future. Her joint spaces are relatively preserved with some mild osteoarthritic change only. This will likely be sometime in the future.  I also refilled her chronic pain medication. She has been compliant for some time.  She is going to start doing  some water aerobics and get in the pool, which hopefully will help a lot with her pain issues.  Follow-up: prn  New Prescriptions   No medications on file   Orders Placed This Encounter  Procedures  . DG HIP UNILAT WITH PELVIS 2-3 VIEWS RIGHT    Signed,  Amelie Caracci T. Ebbie Cherry, MD   Patient's Medications  New Prescriptions   No medications on file  Previous Medications    ASPIRIN 325 MG TABLET    Take 325 mg by mouth as needed.   BIOTIN PO    Take 2,000 mg by mouth daily.    CALCIPOTRIENE-BETAMETHASONE (TACLONEX) OINTMENT    Apply topically daily.   CALCIUM CARBONATE (TUMS - DOSED IN MG ELEMENTAL CALCIUM) 500 MG CHEWABLE TABLET    Chew 3 tablets by mouth daily.   CETIRIZINE (ZYRTEC) 10 MG TABLET    Take 10 mg by mouth daily.   CHOLECALCIFEROL (VITAMIN D-3 PO)    Take 10,000 Units by mouth daily.    CLOBETASOL CREAM (TEMOVATE) 0.05 %    Apply 1 application topically 2 (two) times daily.   DESONIDE (DESOWEN) 0.05 % CREAM       DICLOFENAC SODIUM (VOLTAREN) 1 % GEL    Apply 4 g topically 4 (four) times daily.   DULOXETINE (CYMBALTA) 60 MG CAPSULE    Take 40 mg by mouth daily.    FLUCONAZOLE (DIFLUCAN) 150 MG TABLET    2 tabs po now and repeat in 2 weeks   FLUTICASONE (FLONASE) 50 MCG/ACT NASAL SPRAY    Place into both nostrils daily.   HYDROXYZINE (ATARAX/VISTARIL) 25 MG TABLET    TAKE TWO TABLETS BY MOUTH NIGHTLY AT BEDTIME    LAMOTRIGINE (LAMICTAL) 150 MG TABLET    Take two tablets by mouth daily   LEVALBUTEROL (XOPENEX HFA) 45 MCG/ACT INHALER    Inhale 1-2 puffs into the lungs every 4 (four) hours as needed.   LYSINE 1000 MG TABS    Take 2,000 mg by mouth daily.    MIRABEGRON ER (MYRBETRIQ) 25 MG TB24 TABLET    Take 1 tablet (25 mg total) by mouth daily.   NICOTINE (NICODERM CQ - DOSED IN MG/24 HOURS) 21 MG/24HR PATCH    Place 21 mg onto the skin daily.   PHENAZOPYRIDINE HCL (AZO TABS PO)    Take 1 tablet by mouth as needed.   PROBIOTIC PRODUCT (PROBIOTIC PO)    Take 1 tablet by mouth daily.   RANITIDINE (ZANTAC) 150 MG TABLET    Take 150 mg by mouth 2 (two) times daily as needed for heartburn.   TEMAZEPAM (RESTORIL) 30 MG CAPSULE    TAKE ONE TO TWO CAPSULES BY MOUTH NIGHTLY AT BEDTIME AS NEEDED    TIZANIDINE (ZANAFLEX) 4 MG TABLET    Take 1 tablet (4 mg total) by mouth every 6 (six) hours as needed.   TURMERIC PO    Take 2 tablets by mouth daily.  Modified  Medications   Modified Medication Previous Medication   HYDROCODONE-ACETAMINOPHEN (NORCO) 10-325 MG PER TABLET HYDROcodone-acetaminophen (NORCO) 10-325 MG per tablet      Take 1 tablet by mouth every 6 (six) hours as needed.    Take 1 tablet by mouth every 6 (six) hours as needed.   POLYETHYLENE GLYCOL POWDER (GLYCOLAX/MIRALAX) POWDER polyethylene glycol powder (GLYCOLAX/MIRALAX) powder      MIX 17 GRAMS (1 CAPFUL) WITH 4-8 OZ OF LIQUID AND TAKE BY MOUTH TWICE DAILY AS NEEDED    Take 17 g by  mouth 2 (two) times daily as needed.  Discontinued Medications   No medications on file

## 2015-04-30 ENCOUNTER — Other Ambulatory Visit: Payer: Self-pay | Admitting: Family Medicine

## 2015-05-07 ENCOUNTER — Encounter: Payer: Self-pay | Admitting: *Deleted

## 2015-05-09 DIAGNOSIS — G379 Demyelinating disease of central nervous system, unspecified: Secondary | ICD-10-CM | POA: Diagnosis not present

## 2015-05-13 DIAGNOSIS — L821 Other seborrheic keratosis: Secondary | ICD-10-CM | POA: Diagnosis not present

## 2015-05-13 DIAGNOSIS — L659 Nonscarring hair loss, unspecified: Secondary | ICD-10-CM | POA: Diagnosis not present

## 2015-05-13 DIAGNOSIS — L249 Irritant contact dermatitis, unspecified cause: Secondary | ICD-10-CM | POA: Diagnosis not present

## 2015-05-13 DIAGNOSIS — L811 Chloasma: Secondary | ICD-10-CM | POA: Diagnosis not present

## 2015-05-13 DIAGNOSIS — L573 Poikiloderma of Civatte: Secondary | ICD-10-CM | POA: Diagnosis not present

## 2015-05-13 DIAGNOSIS — L570 Actinic keratosis: Secondary | ICD-10-CM | POA: Diagnosis not present

## 2015-05-14 ENCOUNTER — Inpatient Hospital Stay: Admission: RE | Admit: 2015-05-14 | Payer: Self-pay | Source: Ambulatory Visit

## 2015-05-14 DIAGNOSIS — H2512 Age-related nuclear cataract, left eye: Secondary | ICD-10-CM | POA: Diagnosis not present

## 2015-05-15 ENCOUNTER — Encounter: Payer: Self-pay | Admitting: Family Medicine

## 2015-05-15 ENCOUNTER — Ambulatory Visit (INDEPENDENT_AMBULATORY_CARE_PROVIDER_SITE_OTHER): Payer: Medicare Other | Admitting: Family Medicine

## 2015-05-15 VITALS — BP 100/70 | HR 90 | Temp 98.6°F | Ht 69.0 in | Wt 215.8 lb

## 2015-05-15 DIAGNOSIS — G894 Chronic pain syndrome: Secondary | ICD-10-CM

## 2015-05-15 DIAGNOSIS — M25562 Pain in left knee: Secondary | ICD-10-CM | POA: Diagnosis not present

## 2015-05-15 DIAGNOSIS — M2392 Unspecified internal derangement of left knee: Secondary | ICD-10-CM | POA: Diagnosis not present

## 2015-05-15 MED ORDER — HYDROCODONE-ACETAMINOPHEN 10-325 MG PO TABS: 1.0000 | ORAL_TABLET | Freq: Four times a day (QID) | ORAL | Status: DC | PRN

## 2015-05-15 NOTE — Progress Notes (Signed)
Pre visit review using our clinic review tool, if applicable. No additional management support is needed unless otherwise documented below in the visit note. 

## 2015-05-15 NOTE — Progress Notes (Signed)
Dr. Frederico Hamman T. Gwenith Tschida, MD, [facility] Primary [facility] and Sports Medicine [place], [address] Phone: [phone] Fax: [phone]  05/15/2015  Patient: Emily Moon, MRN: [id number], DOB: 1961/07/30, 54 y.o.  Primary Physician:  Arnette Norris, MD  Chief Complaint: Knee Pain  Subjective:   Emily Moon is a 54 y.o. very pleasant female patient who presents with the following:  Left knee, was up in [place], travelling and in her closet and knee gave out and twisted into the wall.  Happened on about 6/10.  Previously, the majority of all of her problems of been with her hips.  She has since had a few episodes of some mechanical buckling.  She is having relatively minor knee pain right now.  No locking up of the joint.  No direct trauma, but twisting injury as above.  Buckling on the LEFT, some mechanical.  Past Medical History, Surgical History, Social History, Family History, Problem List, Medications, and Allergies have been reviewed and updated if relevant.  Patient Active Problem List   Diagnosis Date Noted  . Perianal abscess 11/07/2014  . Back pain 10/31/2014  . Decreased urination 10/31/2014  . Chest wall pain 08/05/2014  . Weight gain 06/21/2014  . Menstrual abnormality 06/21/2014  . Encounter for routine gynecological examination 06/21/2014  . Bipolar disorder 05/21/2014  . Difficulty in swallowing 03/07/2014  . ETD (eustachian tube dysfunction) 03/07/2014  . Nail fungus 01/22/2014  . Injury of right hand 12/27/2013  . 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 Grief 11/29/2013  . 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 Insomnia 09/01/2011  . Osteoarthritis of hip 08/05/2011  . Obesity 05/04/2011  . Perimenopausal vasomotor symptoms 02/05/2011  . HLD (hyperlipidemia) 01/20/2011  . SMOKER 01/20/2011  . Chronic pain syndrome 01/07/2011  . RENAL CALCULUS, RECURRENT  11/13/2010  . MULTIPLE SCLEROSIS, PROGRESSIVE/RELAPSING 08/28/2010  . GERD 08/28/2010  . OSTEOARTHRITIS 08/28/2010  . NEPHROLITHIASIS, HX OF 08/28/2010    Past Medical History  Diagnosis Date  . Bipolar disorder   . Multiple sclerosis   . Depression   . GERD (gastroesophageal reflux disease)   . Nephrolithiasis   . Osteoporosis     osteoarthritis  . Fibromyalgia syndrome   . 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 Bipolar disorder, unspecified 05/21/2014    Past Surgical History  Procedure Laterality Date  . Tonsillectomy    . Joint replacement Left 2013    hip replacement    History   Social History  . Marital Status: Divorced    Spouse Name: N/A  . Number of Children: 1  . Years of Education: N/A   Occupational History  . Customer Service Rep at [employer]  . Smoking status: Former Smoker -- 0.50 packs/day for 29 years    Types: Cigarettes  . Smokeless tobacco: Never Used     Comment: recenty 04/26/2014  . Alcohol Use: No  . Drug Use: No  . Sexual Activity: No   Other Topics Concern  . Not on file   Social History Narrative    Family History  Problem Relation Age of Onset  . Cancer Father     Allergies  Allergen Reactions  . Tylenol [Acetaminophen] Shortness Of Breath and Swelling  . Ultram [Tramadol Hcl]   . Aripiprazole   . Cefaclor   . Diclofenac Sodium   . Ibuprofen   . Naproxen Sodium  Medication list reviewed and updated in full in [facility].  GEN: No fevers, chills. Nontoxic. Primarily MSK c/o today. MSK: Detailed in the HPI GI: tolerating PO intake without difficulty Neuro: No numbness, parasthesias, or tingling associated. Otherwise the pertinent positives of the ROS are noted above.   Objective:   BP 100/70 mmHg  Pulse 90  Temp(Src) 98.6 F (37 C) (Oral)  Ht 5\' 9"  (1.753 m)  Wt 215 lb 12 oz (97.864 kg)  BMI 31.85 kg/m2  LMP 08/23/2014   GEN: WDWN, NAD, Non-toxic, Alert &  Oriented x 3 HEENT: Atraumatic, Normocephalic.  Ears and Nose: No external deformity. EXTR: No clubbing/cyanosis/edema NEURO: Normal gait.  PSYCH: Normally interactive. Conversant. Not depressed or anxious appearing.  Calm demeanor.    Left knee: Full extension.  Flexion to 120.  Mild patellar crepitus.  Medial and lateral joint line tenderness are present.  ACL, PCL, MCL, and LCL are stable.  McMurray's is positive for pain.  Flexion pinch is positive for pain.  Minimal effusion currently.  Homans test is negative.  Radiology: Dg Hip Unilat With Pelvis 2-3 Views Right  04/22/2015   CLINICAL DATA:  Primary osteoarthritis right hip.  Right hip pain  EXAM: RIGHT HIP (WITH PELVIS) 2-3 VIEWS  COMPARISON:  None.  FINDINGS: Mild joint space narrowing right hip. Negative for fracture or AVN. No significant spurring.  Left hip replacement in satisfactory position and alignment. No acute bony abnormality in the pelvis.  IMPRESSION: Mild degenerative change right hip joint without acute bony abnormality.  Left hip replacement without complication.   Electronically Signed   By: Franchot Gallo M.D.   On: 04/22/2015 16:22    Assessment and Plan:   Left knee pain  Derangement, knee internal, left  Chronic pain syndrome   History is most suggestive of internal derangement, probable meniscal tear.  She is having some intermittent giving way.  If the symptoms persist, then intervention could be considered.  At this point, the patient is in no way ready to consider any type of operative intervention.  Recommended short course of anti-inflammatories, ice, and watchful waiting.  She also requested that I refill her chronic pain medications.  Checking the dating of prior filling, I dated them appropriately by hand with the do not feel handwritten order until appropriate date.  Signed,  Maud Deed. Aurie Harroun, MD   Patient's Medications  New Prescriptions   No medications on file  Previous Medications    ASPIRIN 325 MG TABLET    Take 325 mg by mouth as needed.   BIOTIN PO    Take 2,000 mg by mouth daily.    CALCIPOTRIENE-BETAMETHASONE (TACLONEX) OINTMENT    Apply topically daily.   CALCIUM CARBONATE (TUMS - DOSED IN MG ELEMENTAL CALCIUM) 500 MG CHEWABLE TABLET    Chew 3 tablets by mouth daily.   CETIRIZINE (ZYRTEC) 10 MG TABLET    Take 10 mg by mouth daily.   CHOLECALCIFEROL (VITAMIN D-3 PO)    Take 10,000 Units by mouth daily.    CLOBETASOL CREAM (TEMOVATE) 0.05 %    Apply 1 application topically 2 (two) times daily.   DESONIDE (DESOWEN) 0.05 % CREAM       DICLOFENAC SODIUM (VOLTAREN) 1 % GEL    Apply 4 g topically 4 (four) times daily.   DULOXETINE (CYMBALTA) 60 MG CAPSULE    Take 40 mg by mouth daily.    DUREZOL 0.05 % EMUL       FLUCONAZOLE (DIFLUCAN) 150 MG  TABLET    2 tabs po now and repeat in 2 weeks   FLUTICASONE (FLONASE) 50 MCG/ACT NASAL SPRAY    Place into both nostrils daily.   HYDROXYZINE (ATARAX/VISTARIL) 25 MG TABLET    TAKE TWO TABLETS BY MOUTH NIGHTLY AT BEDTIME    ILEVRO 0.3 % OPHTHALMIC SUSPENSION       LAMOTRIGINE (LAMICTAL) 150 MG TABLET    Take two tablets by mouth daily   LEVALBUTEROL (XOPENEX HFA) 45 MCG/ACT INHALER    Inhale 1-2 puffs into the lungs every 4 (four) hours as needed.   LYSINE 1000 MG TABS    Take 2,000 mg by mouth daily.    MYRBETRIQ 25 MG TB24 TABLET    TAKE ONE TABLET BY MOUTH ONE TIME DAILY   NICOTINE (NICODERM CQ - DOSED IN MG/24 HOURS) 21 MG/24HR PATCH    Place 21 mg onto the skin daily.   PHENAZOPYRIDINE HCL (AZO TABS PO)    Take 1 tablet by mouth as needed.   POLYETHYLENE GLYCOL POWDER (GLYCOLAX/MIRALAX) POWDER    MIX 17 GRAMS (1 CAPFUL) WITH 4-8 OZ OF LIQUID AND TAKE BY MOUTH TWICE DAILY AS NEEDED   PROBIOTIC PRODUCT (PROBIOTIC PO)    Take 1 tablet by mouth daily.   RANITIDINE (ZANTAC) 150 MG TABLET    Take 150 mg by mouth 2 (two) times daily as needed for heartburn.   TEMAZEPAM (RESTORIL) 30 MG CAPSULE    TAKE ONE TO TWO CAPSULES BY MOUTH  NIGHTLY AT BEDTIME AS NEEDED    TIZANIDINE (ZANAFLEX) 4 MG TABLET    Take 1 tablet (4 mg total) by mouth every 6 (six) hours as needed.   TURMERIC PO    Take 2 tablets by mouth daily.  Modified Medications   Modified Medication Previous Medication   HYDROCODONE-ACETAMINOPHEN (NORCO) 10-325 MG PER TABLET HYDROcodone-acetaminophen (NORCO) 10-325 MG per tablet      Take 1 tablet by mouth every 6 (six) hours as needed.    Take 1 tablet by mouth every 6 (six) hours as needed.  Discontinued Medications   No medications on file

## 2015-05-16 ENCOUNTER — Encounter: Payer: Self-pay | Admitting: *Deleted

## 2015-05-21 ENCOUNTER — Ambulatory Visit: Payer: Medicare Other | Admitting: Anesthesiology

## 2015-05-21 ENCOUNTER — Encounter
Admission: RE | Disposition: A | Discharge status: Home / Self Care | Payer: Self-pay | Source: Ambulatory Visit | Attending: Ophthalmology

## 2015-05-21 ENCOUNTER — Encounter: Payer: Self-pay | Admitting: *Deleted

## 2015-05-21 ENCOUNTER — Ambulatory Visit
Admission: RE | Admit: 2015-05-21 | Discharge: 2015-05-21 | Disposition: A | Discharge status: Home / Self Care | Payer: Medicare Other | Source: Ambulatory Visit | Attending: Ophthalmology | Admitting: Ophthalmology

## 2015-05-21 DIAGNOSIS — R062 Wheezing: Secondary | ICD-10-CM | POA: Insufficient documentation

## 2015-05-21 DIAGNOSIS — H2512 Age-related nuclear cataract, left eye: Secondary | ICD-10-CM | POA: Diagnosis not present

## 2015-05-21 DIAGNOSIS — G4730 Sleep apnea, unspecified: Secondary | ICD-10-CM | POA: Insufficient documentation

## 2015-05-21 DIAGNOSIS — Z886 Allergy status to analgesic agent status: Secondary | ICD-10-CM | POA: Diagnosis not present

## 2015-05-21 DIAGNOSIS — F329 Major depressive disorder, single episode, unspecified: Secondary | ICD-10-CM | POA: Diagnosis not present

## 2015-05-21 DIAGNOSIS — E780 Pure hypercholesterolemia: Secondary | ICD-10-CM | POA: Insufficient documentation

## 2015-05-21 DIAGNOSIS — M1990 Unspecified osteoarthritis, unspecified site: Secondary | ICD-10-CM | POA: Diagnosis not present

## 2015-05-21 DIAGNOSIS — M7989 Other specified soft tissue disorders: Secondary | ICD-10-CM | POA: Insufficient documentation

## 2015-05-21 DIAGNOSIS — G35 Multiple sclerosis: Secondary | ICD-10-CM | POA: Diagnosis not present

## 2015-05-21 DIAGNOSIS — F319 Bipolar disorder, unspecified: Secondary | ICD-10-CM | POA: Diagnosis not present

## 2015-05-21 DIAGNOSIS — Z96649 Presence of unspecified artificial hip joint: Secondary | ICD-10-CM | POA: Diagnosis not present

## 2015-05-21 DIAGNOSIS — Z881 Allergy status to other antibiotic agents status: Secondary | ICD-10-CM | POA: Insufficient documentation

## 2015-05-21 DIAGNOSIS — Z87442 Personal history of urinary calculi: Secondary | ICD-10-CM | POA: Insufficient documentation

## 2015-05-21 DIAGNOSIS — J40 Bronchitis, not specified as acute or chronic: Secondary | ICD-10-CM | POA: Insufficient documentation

## 2015-05-21 DIAGNOSIS — R002 Palpitations: Secondary | ICD-10-CM | POA: Insufficient documentation

## 2015-05-21 DIAGNOSIS — Z882 Allergy status to sulfonamides status: Secondary | ICD-10-CM | POA: Diagnosis not present

## 2015-05-21 HISTORY — PX: CATARACT EXTRACTION W/PHACO: SHX586

## 2015-05-21 HISTORY — DX: Other specified postprocedural states: R11.2

## 2015-05-21 HISTORY — DX: Nausea with vomiting, unspecified: Z98.890

## 2015-05-21 HISTORY — DX: Sleep apnea, unspecified: G47.30

## 2015-05-21 SURGERY — PHACOEMULSIFICATION, CATARACT, WITH IOL INSERTION
Anesthesia: Monitor Anesthesia Care | Site: Eye | Laterality: Left | Wound class: Clean

## 2015-05-21 MED ORDER — MOXIFLOXACIN HCL 0.5 % OP SOLN
OPHTHALMIC | Status: DC | PRN
  Administered 2015-05-21: 1 [drp] via OPHTHALMIC

## 2015-05-21 MED ORDER — ARMC OPHTHALMIC DILATING GEL
OPHTHALMIC | Status: AC
  Administered 2015-05-21: 1 via OPHTHALMIC
  Filled 2015-05-21: qty 0.25

## 2015-05-21 MED ORDER — LIDOCAINE HCL (PF) 4 % IJ SOLN
INTRAMUSCULAR | Status: AC
  Filled 2015-05-21: qty 5

## 2015-05-21 MED ORDER — MOXIFLOXACIN HCL 0.5 % OP SOLN: 1.0000 [drp] | Freq: Once | OPHTHALMIC | Status: DC

## 2015-05-21 MED ORDER — CEFUROXIME OPHTHALMIC INJECTION 1 MG/0.1 ML
INJECTION | OPHTHALMIC | Status: AC
  Filled 2015-05-21: qty 0.1

## 2015-05-21 MED ORDER — EPINEPHRINE HCL 1 MG/ML IJ SOLN
INTRAOCULAR | Status: DC | PRN
  Administered 2015-05-21: 200 mL

## 2015-05-21 MED ORDER — ARMC OPHTHALMIC DILATING GEL
1.0000 "application " | OPHTHALMIC | Status: AC
  Administered 2015-05-21: 1 via OPHTHALMIC

## 2015-05-21 MED ORDER — CARBACHOL 0.01 % IO SOLN
INTRAOCULAR | Status: DC | PRN
  Administered 2015-05-21: 0.5 mL via INTRAOCULAR

## 2015-05-21 MED ORDER — TETRACAINE HCL 0.5 % OP SOLN
1.0000 [drp] | Freq: Once | OPHTHALMIC | Status: AC
  Administered 2015-05-21: 1 [drp] via OPHTHALMIC

## 2015-05-21 MED ORDER — TETRACAINE HCL 0.5 % OP SOLN
OPHTHALMIC | Status: AC
  Administered 2015-05-21: 1 [drp] via OPHTHALMIC
  Filled 2015-05-21: qty 2

## 2015-05-21 MED ORDER — CEFUROXIME OPHTHALMIC INJECTION 1 MG/0.1 ML
INJECTION | OPHTHALMIC | Status: DC | PRN
  Administered 2015-05-21: 0.1 mL via INTRACAMERAL

## 2015-05-21 MED ORDER — POVIDONE-IODINE 5 % OP SOLN
OPHTHALMIC | Status: AC
  Administered 2015-05-21: 1 via OPHTHALMIC
  Filled 2015-05-21: qty 30

## 2015-05-21 MED ORDER — SODIUM CHLORIDE 0.9 % IV SOLN
INTRAVENOUS | Status: DC
  Administered 2015-05-21: 10:00:00 via INTRAVENOUS

## 2015-05-21 MED ORDER — POVIDONE-IODINE 5 % OP SOLN
1.0000 "application " | Freq: Once | OPHTHALMIC | Status: AC
  Administered 2015-05-21: 1 via OPHTHALMIC

## 2015-05-21 MED ORDER — FENTANYL CITRATE (PF) 100 MCG/2ML IJ SOLN
INTRAMUSCULAR | Status: DC | PRN
  Administered 2015-05-21: 50 ug via INTRAVENOUS

## 2015-05-21 MED ORDER — ACETAMINOPHEN 325 MG PO TABS: 650.0000 mg | ORAL_TABLET | Freq: Once | ORAL | Status: DC

## 2015-05-21 MED ORDER — ACETAMINOPHEN 325 MG PO TABS
ORAL_TABLET | ORAL | Status: AC
  Administered 2015-05-21: 11:00:00
  Filled 2015-05-21: qty 2

## 2015-05-21 MED ORDER — MOXIFLOXACIN HCL 0.5 % OP SOLN
OPHTHALMIC | Status: AC
  Filled 2015-05-21: qty 3

## 2015-05-21 MED ORDER — MIDAZOLAM HCL 2 MG/2ML IJ SOLN
INTRAMUSCULAR | Status: DC | PRN
  Administered 2015-05-21: 2 mg via INTRAVENOUS
  Administered 2015-05-21: 1 mg via INTRAVENOUS

## 2015-05-21 MED ORDER — EPINEPHRINE HCL 1 MG/ML IJ SOLN
INTRAMUSCULAR | Status: AC
  Filled 2015-05-21: qty 2

## 2015-05-21 MED ORDER — LIDOCAINE HCL (PF) 1 % IJ SOLN
INTRAOCULAR | Status: DC | PRN
  Administered 2015-05-21: .5 mL via OPHTHALMIC

## 2015-05-21 MED ORDER — NA CHONDROIT SULF-NA HYALURON 40-17 MG/ML IO SOLN
INTRAOCULAR | Status: AC
  Filled 2015-05-21: qty 1

## 2015-05-21 SURGICAL SUPPLY — 20 items
CANNULA ANT/CHMB 27GA (MISCELLANEOUS) ×2 IMPLANT
GLOVE BIO SURGEON STRL SZ8 (GLOVE) ×2 IMPLANT
GLOVE BIOGEL M 6.5 STRL (GLOVE) ×2 IMPLANT
GLOVE SURG LX 8.0 MICRO (GLOVE) ×1
GLOVE SURG LX STRL 8.0 MICRO (GLOVE) ×1 IMPLANT
GOWN STRL REUS W/ TWL LRG LVL3 (GOWN DISPOSABLE) ×2 IMPLANT
GOWN STRL REUS W/TWL LRG LVL3 (GOWN DISPOSABLE) ×2
LENS IOL TECNIS 17.0 (Intraocular Lens) ×2 IMPLANT
LENS IOL TECNIS MONO 1P 17.0 (Intraocular Lens) ×1 IMPLANT
PACK CATARACT (MISCELLANEOUS) ×2 IMPLANT
PACK CATARACT BRASINGTON LX (MISCELLANEOUS) ×2 IMPLANT
PACK EYE AFTER SURG (MISCELLANEOUS) ×2 IMPLANT
SOL BSS BAG (MISCELLANEOUS) ×2
SOL PREP PVP 2OZ (MISCELLANEOUS) ×2
SOLUTION BSS BAG (MISCELLANEOUS) ×1 IMPLANT
SOLUTION PREP PVP 2OZ (MISCELLANEOUS) ×1 IMPLANT
SYR 5ML LL (SYRINGE) ×2 IMPLANT
SYR TB 1ML 27GX1/2 LL (SYRINGE) ×2 IMPLANT
WATER STERILE IRR 1000ML POUR (IV SOLUTION) ×2 IMPLANT
WIPE NON LINTING 3.25X3.25 (MISCELLANEOUS) ×2 IMPLANT

## 2015-05-21 NOTE — Op Note (Signed)
PREOPERATIVE DIAGNOSIS:  Nuclear sclerotic cataract of the left eye.   POSTOPERATIVE DIAGNOSIS:  nuclear sclerotic cataract left eye   OPERATIVE PROCEDURE:  Procedure(s): CATARACT EXTRACTION PHACO AND INTRAOCULAR LENS PLACEMENT (IOC)   SURGEON:  Birder Robson, MD.   ANESTHESIA:   Anesthesiologist: Andria Frames, MD CRNA: Bernardo Heater, CRNA  1.      Managed anesthesia care. 2.      Topical tetracaine drops followed by 2% Xylocaine jelly applied in the preoperative holding area.   COMPLICATIONS:  None.   TECHNIQUE:   Stop and chop   DESCRIPTION OF PROCEDURE:  The patient was examined and consented in the preoperative holding area where the aforementioned topical anesthesia was applied to the left eye and then brought back to the Operating Room where the left eye was prepped and draped in the usual sterile ophthalmic fashion and a lid speculum was placed. A paracentesis was created with the side port blade and the anterior chamber was filled with viscoelastic. A near clear corneal incision was performed with the steel keratome. A continuous curvilinear capsulorrhexis was performed with a cystotome followed by the capsulorrhexis forceps. Hydrodissection and hydrodelineation were carried out with BSS on a blunt cannula. The lens was removed in a stop and chop  technique and the remaining cortical material was removed with the irrigation-aspiration handpiece. The capsular bag was inflated with viscoelastic and the Technis ZCB00 lens was placed in the capsular bag without complication. The remaining viscoelastic was removed from the eye with the irrigation-aspiration handpiece. The wounds were hydrated. The anterior chamber was flushed with Miostat and the eye was inflated to physiologic pressure. 0.1 mL of cefuroxime concentration 10 mg/mL was placed in the anterior chamber. The wounds were found to be water tight. The eye was dressed with Vigamox. The patient was given protective glasses to  wear throughout the day and a shield with which to sleep tonight. The patient was also given drops with which to begin a drop regimen today and will follow-up with me in one day.  Implant Name Type Inv. Item Serial No. Manufacturer Lot No. LRB No. Used  LENS IMPL INTRAOC ZCB00 17.0 - [id number] Intraocular Lens LENS IMPL INTRAOC ZCB00 17.0 [id number]   Left 1   Procedure(s) with comments: CATARACT EXTRACTION PHACO AND INTRAOCULAR LENS PLACEMENT (IOC) (Left) - [place] 00:35 AP% 22.9 CDE 8.11 fluid pack lot #[id number]  Electronically signed: [place] 05/21/2015 10:44 AM

## 2015-05-21 NOTE — Anesthesia Preprocedure Evaluation (Signed)
Anesthesia Evaluation  Patient identified by MRN, date of birth, ID band Patient awake    Reviewed: Allergy & Precautions, NPO status , Patient's Chart, lab work & pertinent test results  History of Anesthesia Complications (+) PONV and history of anesthetic complications  Airway Mallampati: II       Dental no notable dental hx.    Pulmonary sleep apnea , former smoker,  breath sounds clear to auscultation  Pulmonary exam normal       Cardiovascular negative cardio ROS Normal cardiovascular examRhythm:regular     Neuro/Psych PSYCHIATRIC DISORDERS  Neuromuscular disease    GI/Hepatic Neg liver ROS, GERD-  Controlled,  Endo/Other  negative endocrine ROS  Renal/GU negative Renal ROS  negative genitourinary   Musculoskeletal negative musculoskeletal ROS (+)   Abdominal   Peds negative pediatric ROS (+)  Hematology negative hematology ROS (+)   Anesthesia Other Findings   Reproductive/Obstetrics negative OB ROS                             Anesthesia Physical Anesthesia Plan  ASA: III  Anesthesia Plan: MAC   Post-op Pain Management:    Induction:   Airway Management Planned:   Additional Equipment:   Intra-op Plan:   Post-operative Plan:   Informed Consent: I have reviewed the patients History and Physical, chart, labs and discussed the procedure including the risks, benefits and alternatives for the proposed anesthesia with the patient or authorized representative who has indicated his/her understanding and acceptance.   Dental Advisory Given  Plan Discussed with: Anesthesiologist, CRNA and Surgeon  Anesthesia Plan Comments:         Anesthesia Quick Evaluation

## 2015-05-21 NOTE — H&P (Signed)
  All labs reviewed. Abnormal studies sent to patients PCP when indicated.  Previous H&P reviewed, patient examined, there are NO CHANGES.  Emily Moon LOUIS7/5/201610:16 AM

## 2015-05-21 NOTE — Progress Notes (Signed)
Patient advises "I can take tylenol in small does, I take Norco at home"

## 2015-05-21 NOTE — Anesthesia Postprocedure Evaluation (Signed)
  Anesthesia Post-op Note  Patient: Emily Moon  Procedure(s) Performed: Procedure(s) with comments: CATARACT EXTRACTION PHACO AND INTRAOCULAR LENS PLACEMENT (IOC) (Left) - [place] 00:35 AP% 22.9 CDE 8.11 fluid pack lot #[id number]  Anesthesia type:MAC  Patient location: PACU  Post pain: Pain level controlled  Post assessment: Post-op Vital signs reviewed, Patient's Cardiovascular Status Stable, Respiratory Function Stable, Patent Airway and No signs of Nausea or vomiting  Post vital signs: Reviewed and stable  Last Vitals:  Filed Vitals:   05/21/15 1101  BP: 124/83  Pulse: 89  Temp:   Resp:     Level of consciousness: awake, alert  and patient cooperative  Complications: No apparent anesthesia complications

## 2015-05-21 NOTE — Transfer of Care (Signed)
Immediate Anesthesia Transfer of Care Note  Patient: Emily Moon  Procedure(s) Performed: Procedure(s) with comments: CATARACT EXTRACTION PHACO AND INTRAOCULAR LENS PLACEMENT (IOC) (Left) - [place] 00:35 AP% 22.9 CDE 8.11 fluid pack lot #[id number] H  Patient Location: PACU  Anesthesia Type:MAC  Level of Consciousness: awake, alert  and oriented  Airway & Oxygen Therapy: Patient Spontanous Breathing  Post-op Assessment: Report given to RN  Post vital signs: Reviewed and stable  Last Vitals:  Filed Vitals:   05/21/15 1045  BP: 112/67  Pulse:   Temp: 36.9 C  Resp: 16    Complications: No apparent anesthesia complications

## 2015-05-21 NOTE — Discharge Instructions (Signed)
Follow postop eye drop instruction sheet as reviewed Eye Surgery Discharge Instructions  Expect mild scratchy sensation or mild soreness. DO NOT RUB YOUR EYE!  The day of surgery:  Minimal physical activity, but bed rest is not required  No reading, computer work, or close hand work  No bending, lifting, or straining.  May watch TV  For 24 hours:  No driving, legal decisions, or alcoholic beverages  Safety precautions  Eat anything you prefer: It is better to start with liquids, then soup then solid foods.  _____ Eye patch should be worn until postoperative exam tomorrow.  ____ Solar shield eyeglasses should be worn for comfort in the sunlight/patch while sleeping  Resume all regular medications including aspirin or Coumadin if these were discontinued prior to surgery. You may shower, bathe, shave, or wash your hair. Tylenol may be taken for mild discomfort.  Call your doctor if you experience significant pain, nausea, or vomiting, fever > 101 or other signs of infection. [phone] or [phone] Specific instructions:  Follow-up Information    Follow up with Tim Lair, MD On 05/22/2015.   Specialty:  Ophthalmology   Why:  9:55 AM    Contact information:   [address] [place] [address] [phone]

## 2015-05-31 DIAGNOSIS — H2511 Age-related nuclear cataract, right eye: Secondary | ICD-10-CM | POA: Diagnosis not present

## 2015-06-03 ENCOUNTER — Encounter: Payer: Self-pay | Admitting: *Deleted

## 2015-06-03 DIAGNOSIS — M1990 Unspecified osteoarthritis, unspecified site: Secondary | ICD-10-CM | POA: Diagnosis not present

## 2015-06-03 DIAGNOSIS — M7989 Other specified soft tissue disorders: Secondary | ICD-10-CM | POA: Diagnosis not present

## 2015-06-03 DIAGNOSIS — Z9842 Cataract extraction status, left eye: Secondary | ICD-10-CM | POA: Diagnosis not present

## 2015-06-03 DIAGNOSIS — G4730 Sleep apnea, unspecified: Secondary | ICD-10-CM | POA: Diagnosis not present

## 2015-06-03 DIAGNOSIS — F319 Bipolar disorder, unspecified: Secondary | ICD-10-CM | POA: Diagnosis not present

## 2015-06-03 DIAGNOSIS — E780 Pure hypercholesterolemia: Secondary | ICD-10-CM | POA: Diagnosis not present

## 2015-06-03 DIAGNOSIS — K219 Gastro-esophageal reflux disease without esophagitis: Secondary | ICD-10-CM | POA: Diagnosis not present

## 2015-06-03 DIAGNOSIS — Z96649 Presence of unspecified artificial hip joint: Secondary | ICD-10-CM | POA: Diagnosis not present

## 2015-06-03 DIAGNOSIS — Z87442 Personal history of urinary calculi: Secondary | ICD-10-CM | POA: Diagnosis not present

## 2015-06-03 DIAGNOSIS — Z87891 Personal history of nicotine dependence: Secondary | ICD-10-CM | POA: Diagnosis not present

## 2015-06-03 DIAGNOSIS — R062 Wheezing: Secondary | ICD-10-CM | POA: Diagnosis not present

## 2015-06-03 DIAGNOSIS — G35 Multiple sclerosis: Secondary | ICD-10-CM | POA: Diagnosis not present

## 2015-06-03 DIAGNOSIS — R002 Palpitations: Secondary | ICD-10-CM | POA: Diagnosis not present

## 2015-06-03 DIAGNOSIS — Z886 Allergy status to analgesic agent status: Secondary | ICD-10-CM | POA: Diagnosis not present

## 2015-06-03 DIAGNOSIS — F329 Major depressive disorder, single episode, unspecified: Secondary | ICD-10-CM | POA: Diagnosis not present

## 2015-06-03 DIAGNOSIS — Z882 Allergy status to sulfonamides status: Secondary | ICD-10-CM | POA: Diagnosis not present

## 2015-06-03 DIAGNOSIS — H2511 Age-related nuclear cataract, right eye: Secondary | ICD-10-CM | POA: Diagnosis not present

## 2015-06-03 DIAGNOSIS — J40 Bronchitis, not specified as acute or chronic: Secondary | ICD-10-CM | POA: Diagnosis not present

## 2015-06-03 DIAGNOSIS — Z888 Allergy status to other drugs, medicaments and biological substances status: Secondary | ICD-10-CM | POA: Diagnosis not present

## 2015-06-04 ENCOUNTER — Ambulatory Visit
Admission: RE | Admit: 2015-06-04 | Discharge: 2015-06-04 | Disposition: A | Discharge status: Home / Self Care | Payer: Medicare Other | Source: Ambulatory Visit | Attending: Ophthalmology | Admitting: Ophthalmology

## 2015-06-04 ENCOUNTER — Ambulatory Visit: Payer: Medicare Other | Admitting: Anesthesiology

## 2015-06-04 ENCOUNTER — Encounter
Admission: RE | Disposition: A | Discharge status: Home / Self Care | Payer: Self-pay | Source: Ambulatory Visit | Attending: Ophthalmology

## 2015-06-04 ENCOUNTER — Encounter: Payer: Self-pay | Admitting: Anesthesiology

## 2015-06-04 DIAGNOSIS — R062 Wheezing: Secondary | ICD-10-CM | POA: Diagnosis not present

## 2015-06-04 DIAGNOSIS — Z888 Allergy status to other drugs, medicaments and biological substances status: Secondary | ICD-10-CM | POA: Insufficient documentation

## 2015-06-04 DIAGNOSIS — F319 Bipolar disorder, unspecified: Secondary | ICD-10-CM | POA: Diagnosis not present

## 2015-06-04 DIAGNOSIS — F329 Major depressive disorder, single episode, unspecified: Secondary | ICD-10-CM | POA: Diagnosis not present

## 2015-06-04 DIAGNOSIS — Z9842 Cataract extraction status, left eye: Secondary | ICD-10-CM | POA: Insufficient documentation

## 2015-06-04 DIAGNOSIS — Z882 Allergy status to sulfonamides status: Secondary | ICD-10-CM | POA: Insufficient documentation

## 2015-06-04 DIAGNOSIS — Z96649 Presence of unspecified artificial hip joint: Secondary | ICD-10-CM | POA: Diagnosis not present

## 2015-06-04 DIAGNOSIS — M1990 Unspecified osteoarthritis, unspecified site: Secondary | ICD-10-CM | POA: Insufficient documentation

## 2015-06-04 DIAGNOSIS — J40 Bronchitis, not specified as acute or chronic: Secondary | ICD-10-CM | POA: Insufficient documentation

## 2015-06-04 DIAGNOSIS — Z886 Allergy status to analgesic agent status: Secondary | ICD-10-CM | POA: Diagnosis not present

## 2015-06-04 DIAGNOSIS — K219 Gastro-esophageal reflux disease without esophagitis: Secondary | ICD-10-CM | POA: Diagnosis not present

## 2015-06-04 DIAGNOSIS — E780 Pure hypercholesterolemia: Secondary | ICD-10-CM | POA: Diagnosis not present

## 2015-06-04 DIAGNOSIS — Z87442 Personal history of urinary calculi: Secondary | ICD-10-CM | POA: Insufficient documentation

## 2015-06-04 DIAGNOSIS — G4730 Sleep apnea, unspecified: Secondary | ICD-10-CM | POA: Insufficient documentation

## 2015-06-04 DIAGNOSIS — M7989 Other specified soft tissue disorders: Secondary | ICD-10-CM | POA: Diagnosis not present

## 2015-06-04 DIAGNOSIS — Z87891 Personal history of nicotine dependence: Secondary | ICD-10-CM | POA: Diagnosis not present

## 2015-06-04 DIAGNOSIS — R002 Palpitations: Secondary | ICD-10-CM | POA: Insufficient documentation

## 2015-06-04 DIAGNOSIS — G35 Multiple sclerosis: Secondary | ICD-10-CM | POA: Diagnosis not present

## 2015-06-04 DIAGNOSIS — H2511 Age-related nuclear cataract, right eye: Secondary | ICD-10-CM | POA: Diagnosis not present

## 2015-06-04 HISTORY — DX: Unspecified osteoarthritis, unspecified site: M19.90

## 2015-06-04 HISTORY — PX: CATARACT EXTRACTION W/PHACO: SHX586

## 2015-06-04 HISTORY — DX: Palpitations: R00.2

## 2015-06-04 HISTORY — DX: Edema, unspecified: R60.9

## 2015-06-04 SURGERY — PHACOEMULSIFICATION, CATARACT, WITH IOL INSERTION
Anesthesia: Monitor Anesthesia Care | Site: Eye | Laterality: Right | Wound class: Clean

## 2015-06-04 MED ORDER — MIDAZOLAM HCL 2 MG/2ML IJ SOLN
INTRAMUSCULAR | Status: DC | PRN
  Administered 2015-06-04: 2 mg via INTRAVENOUS

## 2015-06-04 MED ORDER — POVIDONE-IODINE 5 % OP SOLN
1.0000 "application " | OPHTHALMIC | Status: AC | PRN
  Administered 2015-06-04: 1 via OPHTHALMIC

## 2015-06-04 MED ORDER — CEFUROXIME OPHTHALMIC INJECTION 1 MG/0.1 ML
INJECTION | OPHTHALMIC | Status: AC
  Filled 2015-06-04: qty 0.1

## 2015-06-04 MED ORDER — NA CHONDROIT SULF-NA HYALURON 40-17 MG/ML IO SOLN
INTRAOCULAR | Status: AC
  Filled 2015-06-04: qty 1

## 2015-06-04 MED ORDER — EPINEPHRINE HCL 1 MG/ML IJ SOLN
INTRAOCULAR | Status: DC | PRN
  Administered 2015-06-04: 200 mL

## 2015-06-04 MED ORDER — TETRACAINE HCL 0.5 % OP SOLN
1.0000 [drp] | OPHTHALMIC | Status: AC | PRN
  Administered 2015-06-04: 1 [drp] via OPHTHALMIC

## 2015-06-04 MED ORDER — FENTANYL CITRATE (PF) 100 MCG/2ML IJ SOLN
INTRAMUSCULAR | Status: DC | PRN
  Administered 2015-06-04 (×2): 50 ug via INTRAVENOUS

## 2015-06-04 MED ORDER — SODIUM CHLORIDE 0.9 % IV SOLN
INTRAVENOUS | Status: DC
  Administered 2015-06-04: 11:00:00 via INTRAVENOUS

## 2015-06-04 MED ORDER — MOXIFLOXACIN HCL 0.5 % OP SOLN
OPHTHALMIC | Status: AC
  Filled 2015-06-04: qty 3

## 2015-06-04 MED ORDER — EPINEPHRINE HCL 1 MG/ML IJ SOLN
INTRAMUSCULAR | Status: AC
  Filled 2015-06-04: qty 2

## 2015-06-04 MED ORDER — POVIDONE-IODINE 5 % OP SOLN
OPHTHALMIC | Status: AC
  Administered 2015-06-04: 1 via OPHTHALMIC
  Filled 2015-06-04: qty 30

## 2015-06-04 MED ORDER — ARMC OPHTHALMIC DILATING GEL
1.0000 "application " | OPHTHALMIC | Status: DC | PRN
  Administered 2015-06-04: 1 via OPHTHALMIC

## 2015-06-04 MED ORDER — MOXIFLOXACIN HCL 0.5 % OP SOLN
OPHTHALMIC | Status: DC | PRN
  Administered 2015-06-04: 2 [drp] via OPHTHALMIC

## 2015-06-04 MED ORDER — LIDOCAINE HCL (PF) 4 % IJ SOLN
INTRAMUSCULAR | Status: AC
  Filled 2015-06-04: qty 5

## 2015-06-04 MED ORDER — CEFUROXIME OPHTHALMIC INJECTION 1 MG/0.1 ML
INJECTION | OPHTHALMIC | Status: DC | PRN
  Administered 2015-06-04: 0.1 mL via INTRACAMERAL

## 2015-06-04 MED ORDER — TETRACAINE HCL 0.5 % OP SOLN
OPHTHALMIC | Status: AC
  Administered 2015-06-04: 1 [drp] via OPHTHALMIC
  Filled 2015-06-04: qty 2

## 2015-06-04 MED ORDER — CARBACHOL 0.01 % IO SOLN
INTRAOCULAR | Status: DC | PRN
  Administered 2015-06-04: .2 mL via INTRAOCULAR

## 2015-06-04 SURGICAL SUPPLY — 21 items
CANNULA ANT/CHMB 27GA (MISCELLANEOUS) ×4 IMPLANT
CUP MEDICINE 2OZ PLAST GRAD ST (MISCELLANEOUS) ×2 IMPLANT
GLOVE BIO SURGEON STRL SZ8 (GLOVE) ×2 IMPLANT
GLOVE BIOGEL M 6.5 STRL (GLOVE) ×2 IMPLANT
GLOVE SURG LX 8.0 MICRO (GLOVE) ×1
GLOVE SURG LX STRL 8.0 MICRO (GLOVE) ×1 IMPLANT
GOWN STRL REUS W/ TWL LRG LVL3 (GOWN DISPOSABLE) ×2 IMPLANT
GOWN STRL REUS W/TWL LRG LVL3 (GOWN DISPOSABLE) ×2
LENS IOL TECNIS 17.0 (Intraocular Lens) ×2 IMPLANT
LENS IOL TECNIS MONO 1P 17.0 (Intraocular Lens) ×1 IMPLANT
PACK CATARACT (MISCELLANEOUS) ×2 IMPLANT
PACK CATARACT BRASINGTON LX (MISCELLANEOUS) ×2 IMPLANT
PACK EYE AFTER SURG (MISCELLANEOUS) ×2 IMPLANT
SOL BSS BAG (MISCELLANEOUS) ×2
SOL PREP PVP 2OZ (MISCELLANEOUS) ×2
SOLUTION BSS BAG (MISCELLANEOUS) ×1 IMPLANT
SOLUTION PREP PVP 2OZ (MISCELLANEOUS) ×1 IMPLANT
SYR 5ML LL (SYRINGE) ×4 IMPLANT
SYR TB 1ML 27GX1/2 LL (SYRINGE) ×2 IMPLANT
WATER STERILE IRR 1000ML POUR (IV SOLUTION) ×2 IMPLANT
WIPE NON LINTING 3.25X3.25 (MISCELLANEOUS) ×2 IMPLANT

## 2015-06-04 NOTE — Transfer of Care (Signed)
Immediate Anesthesia Transfer of Care Note  Patient: Emily Moon  Procedure(s) Performed: Procedure(s) with comments: CATARACT EXTRACTION PHACO AND INTRAOCULAR LENS PLACEMENT (IOC) (Right) - US:00:48 AP%: 10.5 CDE:5.08 Fluid lot #[id number] H  Patient Location: PACU  Anesthesia Type:MAC  Level of Consciousness: awake, alert , oriented and patient cooperative  Airway & Oxygen Therapy: Patient Spontanous Breathing  Post-op Assessment: Report given to RN and Post -op Vital signs reviewed and stable  Post vital signs: Reviewed and stable  Last Vitals:  Filed Vitals:   06/04/15 1229  BP: 128/69  Pulse:   Temp: 36.9 C  Resp: 16    Complications: No apparent anesthesia complications

## 2015-06-04 NOTE — Anesthesia Preprocedure Evaluation (Addendum)
Anesthesia Evaluation  Patient identified by MRN, date of birth, ID band Patient awake    Reviewed: Allergy & Precautions, NPO status , Patient's Chart, lab work & pertinent test results, reviewed documented beta blocker date and time   History of Anesthesia Complications (+) PONV and history of anesthetic complications  Airway Mallampati: II  TM Distance: >3 FB     Dental  (+) Chipped   Pulmonary sleep apnea , former smoker,          Cardiovascular     Neuro/Psych PSYCHIATRIC DISORDERS Depression Bipolar Disorder  Neuromuscular disease    GI/Hepatic GERD-  ,  Endo/Other    Renal/GU Renal InsufficiencyRenal disease     Musculoskeletal  (+) Arthritis -, Osteoarthritis,    Abdominal   Peds  Hematology   Anesthesia Other Findings   Reproductive/Obstetrics                            Anesthesia Physical Anesthesia Plan  ASA: III  Anesthesia Plan: MAC   Post-op Pain Management:    Induction:   Airway Management Planned: Nasal Cannula  Additional Equipment:   Intra-op Plan:   Post-operative Plan:   Informed Consent: I have reviewed the patients History and Physical, chart, labs and discussed the procedure including the risks, benefits and alternatives for the proposed anesthesia with the patient or authorized representative who has indicated his/her understanding and acceptance.     Plan Discussed with: CRNA  Anesthesia Plan Comments:         Anesthesia Quick Evaluation

## 2015-06-04 NOTE — Op Note (Signed)
PREOPERATIVE DIAGNOSIS:  Nuclear sclerotic cataract of the right eye.   POSTOPERATIVE DIAGNOSIS: right nuclear sclerotic cataract   OPERATIVE PROCEDURE:  Procedure(s): CATARACT EXTRACTION PHACO AND INTRAOCULAR LENS PLACEMENT (IOC)   SURGEON:  Birder Robson, MD.   ANESTHESIA:  Anesthesiologist: Gunnar Bulla, MD CRNA: Bernardo Heater, CRNA; Delaney Meigs, CRNA  1.      Managed anesthesia care. 2.      Topical tetracaine drops followed by 2% Xylocaine jelly applied in the preoperative holding area.       3.  0.2 ml of epi-Shugarcaine was  placed in the anterior chamber following the paracentesis.   COMPLICATIONS:  None.   TECHNIQUE:   Stop and chop   DESCRIPTION OF PROCEDURE:  The patient was examined and consented in the preoperative holding area where the aforementioned topical anesthesia was applied to the right eye and then brought back to the Operating Room where the right eye was prepped and draped in the usual sterile ophthalmic fashion and a lid speculum was placed. A paracentesis was created with the side port blade and the anterior chamber was filled with viscoelastic. A near clear corneal incision was performed with the steel keratome. A continuous curvilinear capsulorrhexis was performed with a cystotome followed by the capsulorrhexis forceps. Hydrodissection and hydrodelineation were carried out with BSS on a blunt cannula. The lens was removed in a stop and chop  technique and the remaining cortical material was removed with the irrigation-aspiration handpiece. The capsular bag was inflated with viscoelastic and the Technis ZCB00  lens was placed in the capsular bag without complication. The remaining viscoelastic was removed from the eye with the irrigation-aspiration handpiece. The wounds were hydrated. The anterior chamber was flushed with Miostat and the eye was inflated to physiologic pressure. 0.1 mL of cefuroxime concentration 10 mg/mL was placed in the anterior chamber. The  wounds were found to be water tight. The eye was dressed with Vigamox. The patient was given protective glasses to wear throughout the day and a shield with which to sleep tonight. The patient was also given drops with which to begin a drop regimen today and will follow-up with me in one day. * No implants in log * Procedure(s) with comments: CATARACT EXTRACTION PHACO AND INTRAOCULAR LENS PLACEMENT (IOC) (Right) - US:00:48 AP%: 10.5 CDE:5.08 Fluid lot #[id number] H  Electronically signed: [place] 06/04/2015 12:27 PM

## 2015-06-04 NOTE — Discharge Instructions (Signed)
AMBULATORY SURGERY  °DISCHARGE INSTRUCTIONS ° ° °1) The drugs that you were given will stay in your system until tomorrow so for the next 24 hours you should not: ° °A) Drive an automobile °B) Make any legal decisions °C) Drink any alcoholic beverage ° ° °2) You may resume regular meals tomorrow.  Today it is better to start with liquids and gradually work up to solid foods. ° °You may eat anything you prefer, but it is better to start with liquids, then soup and crackers, and gradually work up to solid foods. ° ° °3) Please notify your doctor immediately if you have any unusual bleeding, trouble breathing, redness and pain at the surgery site, drainage, fever, or pain not relieved by medication. ° ° ° °4) Additional Instructions: ° ° ° °Cataract Surgery °Care After °Refer to this sheet in the next few weeks. These instructions provide you with information on caring for yourself after your procedure. Your caregiver may also give you more specific instructions. Your treatment has been planned according to current medical practices, but problems sometimes occur. Call your caregiver if you have any problems or questions after your procedure.  °HOME CARE INSTRUCTIONS  °· Avoid strenuous activities as directed by your caregiver. °· Ask your caregiver when you can resume driving. °· Use eyedrops or other medicines to help healing and control pressure inside your eye as directed by your caregiver. °· Only take over-the-counter or prescription medicines for pain, discomfort, or fever as directed by your caregiver. °· Do not to touch or rub your eyes. °· You may be instructed to use a protective shield during the first few days and nights after surgery. If not, wear sunglasses to protect your eyes. This is to protect the eye from pressure or from being accidentally bumped. °· Keep the area around your eye clean and dry. Avoid swimming or allowing water to hit you directly in the face while showering. Keep soap and shampoo  out of your eyes. °· Do not bend or lift heavy objects. Bending increases pressure in the eye. You can walk, climb stairs, and do light household chores. °· Do not put a contact lens into the eye that had surgery until your caregiver says it is okay to do so. °· Ask your doctor when you can return to work. This will depend on the kind of work that you do. If you work in a dusty environment, you may be advised to wear protective eyewear for a period of time. °· Ask your caregiver when it will be safe to engage in sexual activity. °· Continue with your regular eye exams as directed by your caregiver. °What to expect: °· It is normal to feel itching and mild discomfort for a few days after cataract surgery. Some fluid discharge is also common, and your eye may be sensitive to light and touch. °· After 1 to 2 days, even moderate discomfort should disappear. In most cases, healing will take about 6 weeks. °· If you received an intraocular lens (IOL), you may notice that colors are very bright or have a blue tinge. Also, if you have been in bright sunlight, everything may appear reddish for a few hours. If you see these color tinges, it is because your lens is clear and no longer cloudy. Within a few months after receiving an IOL, these extra colors should go away. When you have healed, you will probably need new glasses. °SEEK MEDICAL CARE IF:  °· You have increased bruising around your   eye. °· You have discomfort not helped by medicine. °SEEK IMMEDIATE MEDICAL CARE IF:  °· You have a  fever. °· You have a worsening or sudden vision loss. °· You have redness, swelling, or increasing pain in the eye. °· You have a thick discharge from the eye that had surgery. °MAKE SURE YOU: °· Understand these instructions. °· Will watch your condition. °· Will get help right away if you are not doing well or get worse. °Document Released: 05/22/2005 Document Revised: 01/25/2012 Document Reviewed: 06/26/2011 °[employer]® Patient  Information ©2015 [employer]. This information is not intended to replace advice given to you by your health care provider. Make sure you discuss any questions you have with your health care provider. ° ° ° ° °Please contact your physician with any problems or Same Day Surgery at [phone], Monday through Friday 6 am to 4 pm, or [place] [facility] number at [phone]. °

## 2015-06-04 NOTE — H&P (Signed)
  All labs reviewed. Abnormal studies sent to patients PCP when indicated.  Previous H&P reviewed, patient examined, there are NO CHANGES.  Emily Rudy LOUIS7/19/201611:56 AM

## 2015-06-04 NOTE — Anesthesia Postprocedure Evaluation (Signed)
  Anesthesia Post-op Note  Patient: Emily Moon  Procedure(s) Performed: Procedure(s) with comments: CATARACT EXTRACTION PHACO AND INTRAOCULAR LENS PLACEMENT (IOC) (Right) - US:00:48 AP%: 10.5 CDE:5.08 Fluid lot #[id number] H  Anesthesia type:MAC  Patient location: PACU  Post pain: Pain level controlled  Post assessment: Post-op Vital signs reviewed, Patient's Cardiovascular Status Stable, Respiratory Function Stable, Patent Airway and No signs of Nausea or vomiting  Post vital signs: Reviewed and stable  Last Vitals:  Filed Vitals:   06/04/15 1229  BP: 128/69  Pulse:   Temp: 36.9 C  Resp: 16    Level of consciousness: awake, alert  and patient cooperative  Complications: No apparent anesthesia complications

## 2015-06-13 ENCOUNTER — Other Ambulatory Visit: Payer: Self-pay | Admitting: Family Medicine

## 2015-06-13 DIAGNOSIS — E785 Hyperlipidemia, unspecified: Secondary | ICD-10-CM

## 2015-06-17 ENCOUNTER — Other Ambulatory Visit: Payer: Medicare Other

## 2015-06-21 ENCOUNTER — Ambulatory Visit (INDEPENDENT_AMBULATORY_CARE_PROVIDER_SITE_OTHER): Payer: Medicare Other | Admitting: Family Medicine

## 2015-06-21 ENCOUNTER — Encounter: Payer: Self-pay | Admitting: Family Medicine

## 2015-06-21 VITALS — BP 106/78 | HR 92 | Temp 99.1°F | Ht 69.0 in | Wt 217.8 lb

## 2015-06-21 DIAGNOSIS — M7540 Impingement syndrome of unspecified shoulder: Secondary | ICD-10-CM

## 2015-06-21 DIAGNOSIS — G894 Chronic pain syndrome: Secondary | ICD-10-CM

## 2015-06-21 MED ORDER — HYDROCODONE-ACETAMINOPHEN 10-325 MG PO TABS: 1.0000 | ORAL_TABLET | Freq: Four times a day (QID) | ORAL | Status: DC | PRN

## 2015-06-21 NOTE — Progress Notes (Signed)
Dr. Frederico Hamman T. Wille Aubuchon, MD, [facility] Medicine [place], [address] Phone: [phone] Fax: [phone]  06/21/2015  Patient: Emily Moon, MRN: [id number], DOB: 20-Jun-1961, 54 y.o.  Primary Physician:  Arnette Norris, MD  Chief Complaint: Shoulder Pain and Muscle Pain  Subjective:   Emily Moon is a 54 y.o. very pleasant female patient who presents with the following:  72 - 20 days ago, cataract surgery. L eye is a little bit blurry.   B shoulder pain.  RTC impingement  Pleasant patient who I know well who is having bilateral shoulder pain when she is abducting and having terminal internal range of motion.  This is been ongoing for a couple of months.  The patient just had cataract surgery about 2 or 3 weeks ago, and she is still in the recovery.  For this.  She has some deep ache in the shoulder itself and pain in a T-shirt distribution.  She has some pain with reaching across her body and with abduction.  She has not been able to do any kind of physical exercise and hasn't been doing any further least greater than 6 months.  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  Past Medical History, Surgical History, Social History, Family History, Problem List, Medications, and Allergies have been reviewed and updated if relevant.  Patient Active Problem List   Diagnosis Date Noted  . Perianal abscess 11/07/2014  . Back pain 10/31/2014  . Decreased urination 10/31/2014  . Chest wall pain 08/05/2014  . Weight gain 06/21/2014  . Menstrual abnormality 06/21/2014  . Encounter for routine gynecological examination 06/21/2014  . Bipolar disorder 05/21/2014  . Difficulty in swallowing 03/07/2014  . ETD (eustachian tube dysfunction) 03/07/2014  . Nail fungus 01/22/2014  . Injury of right hand 12/27/2013  . 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 Grief 11/29/2013  . 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 Insomnia 09/01/2011  . Osteoarthritis of hip 08/05/2011  . Obesity 05/04/2011  . Perimenopausal vasomotor symptoms 02/05/2011  . HLD (hyperlipidemia) 01/20/2011  . SMOKER 01/20/2011  . Chronic pain syndrome 01/07/2011  . RENAL CALCULUS, RECURRENT 11/13/2010  . MULTIPLE SCLEROSIS, PROGRESSIVE/RELAPSING 08/28/2010  . GERD 08/28/2010  . OSTEOARTHRITIS 08/28/2010  . NEPHROLITHIASIS, HX OF 08/28/2010    Past Medical History  Diagnosis Date  . Bipolar disorder   . Multiple sclerosis   . Depression   . GERD (gastroesophageal reflux disease)   . Nephrolithiasis   . Osteoporosis     osteoarthritis  . Fibromyalgia syndrome   . 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 Bipolar disorder, unspecified 05/21/2014  . Sleep apnea   . Arthritis   . PONV (postoperative nausea and vomiting)     no problem after cataract surgery  . Heart palpitations   . Edema     feet/legs    Past Surgical History  Procedure Laterality Date  . Tonsillectomy    . Joint replacement Left 2013    hip replacement  . Lithotripsy    . Cataract extraction w/phaco Left 05/21/2015    Procedure: CATARACT EXTRACTION PHACO AND INTRAOCULAR LENS PLACEMENT (IOC);  Surgeon: Birder Robson, MD;  Location: [facility] ORS;  Service: Ophthalmology;  Laterality: Left;  [place] 00:35 AP% 22.9 CDE 8.11 fluid pack lot #[id number]  . Foot surgery    . Thumb arthroscopy    . Eye surgery      tissue biopsy  .  Cataract extraction w/phaco Right 06/04/2015    Procedure: CATARACT EXTRACTION PHACO AND INTRAOCULAR LENS PLACEMENT (IOC);  Surgeon: Birder Robson, MD;  Location: [facility] ORS;  Service: Ophthalmology;  Laterality: Right;  US:00:48 AP%: 10.5 CDE:5.08 Fluid lot #[id number] H    History   Social History  . Marital Status: Divorced    Spouse Name: N/A  . Number of Children: 1  . Years of Education: N/A   Occupational History  . 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 Smoking status: Former Smoker -- 0.50 packs/day for 29 years    Types: Cigarettes  . Smokeless tobacco: Never Used     Comment: recenty 04/26/2014  . Alcohol Use: No  . Drug Use: No  . Sexual Activity: No   Other Topics Concern  . Not on file   Social History Narrative    Family History  Problem Relation Age of Onset  . Cancer Father     Allergies  Allergen Reactions  . Tylenol [Acetaminophen] Shortness Of Breath and Swelling  . Ultram [Tramadol Hcl]   . Aripiprazole   . Cefaclor   . Diclofenac Sodium   . Ibuprofen   . Naproxen Sodium   . Sulfa Antibiotics   . Voltaren [Diclofenac]     Medication list reviewed and updated in full in [place].  GEN: No fevers, chills. Nontoxic. Primarily MSK c/o today. MSK: Detailed in the HPI GI: tolerating PO intake without difficulty Neuro: No numbness, parasthesias, or tingling associated. Otherwise the pertinent positives of the ROS are noted above.   Objective:   BP 106/78 mmHg  Pulse 92  Temp(Src) 99.1 F (37.3 C) (Oral)  Ht 5\' 9"  (1.753 m)  Wt 217 lb 12 oz (98.771 kg)  BMI 32.14 kg/m2  LMP 08/23/2014   GEN: Well-developed,well-nourished,in no acute distress; alert,appropriate and cooperative throughout examination HEENT: Normocephalic and atraumatic without obvious abnormalities. Ears, externally no deformities PULM: Breathing comfortably in no respiratory distress EXT: No clubbing, cyanosis, or edema PSYCH: Normally interactive. Cooperative during the interview. Pleasant. Friendly and conversant. Not anxious or depressed appearing. Normal, full affect.  Shoulder: B Inspection: No muscle wasting or winging Ecchymosis/edema: neg  AC joint, scapula, clavicle: NT Cervical spine: NT, full ROM Spurling's: neg Abduction: full, 5/5 Flexion: full, 5/5 IR, full, lift-off: 5/5 ER at neutral: full, 5/5 AC crossover: neg Neer: pos Hawkins: pos Drop Test: neg Empty Can: pos Supraspinatus  insertion: mild-mod T Bicipital groove: NT Speed's: neg Yergason's: neg Sulcus sign: neg Scapular dyskinesis: none C5-T1 intact  Neuro: Sensation intact Grip 5/5   Radiology: No results found.  Assessment and Plan:   Impingement syndrome, shoulder, unspecified laterality  Chronic pain syndrome   Classic impingement or rotator cuff tendinopathy on exam.  Postop period for cataracts.  At this point I really would just like her do some rehabilitation and I gave her the moon protocol, and she is going to get in the pool as soon as she gets a go-ahead from her ophthalmologist.  We will revisit her progress in about 6 weeks.  Follow-up: Return in about 6 weeks (around 08/02/2015).  Signed,  Maud Deed. Gaspard Isbell, MD   Patient's Medications  New Prescriptions   No medications on file  Previous Medications   ASPIRIN 325 MG TABLET    Take 325 mg by mouth as needed.   BIOTIN PO    Take 2,000 mg by mouth daily.    CALCIPOTRIENE-BETAMETHASONE ([place]) OINTMENT  Apply topically daily.   CALCIUM CARBONATE (TUMS - DOSED IN MG ELEMENTAL CALCIUM) 500 MG CHEWABLE TABLET    Chew 3 tablets by mouth daily.   CETIRIZINE (ZYRTEC) 10 MG TABLET    Take 10 mg by mouth daily.   CHOLECALCIFEROL (VITAMIN D-3 PO)    Take 10,000 Units by mouth daily.    CLOBETASOL CREAM (TEMOVATE) 0.05 %    Apply 1 application topically 2 (two) times daily.   DESONIDE (DESOWEN) 0.05 % CREAM       DICLOFENAC SODIUM (VOLTAREN) 1 % GEL    Apply 4 g topically 4 (four) times daily.   DULOXETINE (CYMBALTA) 20 MG CAPSULE    Take 40 mg by mouth daily.   DUREZOL 0.05 % EMUL       GARCINIA CAMBOGIA-CHROMIUM 500-200 MG-MCG TABS    Take 1 tablet by mouth 2 (two) times daily.   HYDROQUINONE 4 % CREAM    APPLY TO AFFECTED AREAS ON FACE EVERY DAY AT BEDTIME   HYDROXYZINE (ATARAX/VISTARIL) 25 MG TABLET    TAKE TWO TABLETS BY MOUTH NIGHTLY AT BEDTIME    HYDROXYZINE (VISTARIL) 25 MG CAPSULE       ILEVRO 0.3 % OPHTHALMIC SUSPENSION        LAMOTRIGINE (LAMICTAL) 150 MG TABLET    Take two tablets by mouth daily   LEVALBUTEROL (XOPENEX HFA) 45 MCG/ACT INHALER    Inhale 1-2 puffs into the lungs every 4 (four) hours as needed.   LYSINE 1000 MG TABS    Take 2,000 mg by mouth daily.    MYRBETRIQ 25 MG TB24 TABLET    TAKE ONE TABLET BY MOUTH ONE TIME DAILY   NICOTINE (NICODERM CQ - DOSED IN MG/24 HOURS) 21 MG/24HR PATCH    Place 21 mg onto the skin daily.   OMEGA-3 FATTY ACIDS (FISH OIL PO)    Take 2 capsules by mouth at bedtime.   PHENAZOPYRIDINE HCL (AZO TABS PO)    Take 1 tablet by mouth as needed.   POLYETHYLENE GLYCOL POWDER (GLYCOLAX/MIRALAX) POWDER    MIX 17 GRAMS (1 CAPFUL) WITH 4-8 OZ OF LIQUID AND TAKE BY MOUTH TWICE DAILY AS NEEDED   PROBIOTIC PRODUCT (PROBIOTIC PO)    Take 1 tablet by mouth daily.   RANITIDINE (ZANTAC) 150 MG TABLET    Take 150 mg by mouth 2 (two) times daily as needed for heartburn.   TEMAZEPAM (RESTORIL) 30 MG CAPSULE    TAKE ONE TO TWO CAPSULES BY MOUTH NIGHTLY AT BEDTIME AS NEEDED    TIZANIDINE (ZANAFLEX) 4 MG TABLET    Take 1 tablet (4 mg total) by mouth every 6 (six) hours as needed.  Modified Medications   Modified Medication Previous Medication   HYDROCODONE-ACETAMINOPHEN (NORCO) 10-325 MG PER TABLET HYDROcodone-acetaminophen (NORCO) 10-325 MG per tablet      Take 1 tablet by mouth every 6 (six) hours as needed.    Take 1 tablet by mouth every 6 (six) hours as needed.  Discontinued Medications   DULOXETINE (CYMBALTA) 60 MG CAPSULE    Take 40 mg by mouth daily.    FLUCONAZOLE (DIFLUCAN) 150 MG TABLET    2 tabs po now and repeat in 2 weeks   FLUTICASONE (FLONASE) 50 MCG/ACT NASAL SPRAY    Place into both nostrils daily.   TURMERIC PO    Take 2 tablets by mouth daily.

## 2015-06-21 NOTE — Progress Notes (Signed)
Pre visit review using our clinic review tool, if applicable. No additional management support is needed unless otherwise documented below in the visit note. 

## 2015-06-24 ENCOUNTER — Other Ambulatory Visit: Payer: Self-pay | Admitting: Family Medicine

## 2015-06-24 ENCOUNTER — Encounter: Payer: Medicare Other | Admitting: Family Medicine

## 2015-07-15 ENCOUNTER — Ambulatory Visit (INDEPENDENT_AMBULATORY_CARE_PROVIDER_SITE_OTHER): Payer: Medicare Other | Admitting: Family Medicine

## 2015-07-15 ENCOUNTER — Encounter: Payer: Self-pay | Admitting: Family Medicine

## 2015-07-15 VITALS — BP 90/66 | HR 93 | Temp 98.7°F | Ht 69.0 in | Wt 217.0 lb

## 2015-07-15 DIAGNOSIS — G894 Chronic pain syndrome: Secondary | ICD-10-CM | POA: Diagnosis not present

## 2015-07-15 DIAGNOSIS — M7540 Impingement syndrome of unspecified shoulder: Secondary | ICD-10-CM

## 2015-07-15 DIAGNOSIS — M25511 Pain in right shoulder: Secondary | ICD-10-CM

## 2015-07-15 MED ORDER — TIZANIDINE HCL 4 MG PO TABS: 4.0000 mg | ORAL_TABLET | Freq: Four times a day (QID) | ORAL | Status: DC | PRN

## 2015-07-15 MED ORDER — CICLOPIROX 8 % EX SOLN: CUTANEOUS | Status: DC

## 2015-07-15 MED ORDER — METHYLPREDNISOLONE ACETATE 40 MG/ML IJ SUSP
80.0000 mg | Freq: Once | INTRAMUSCULAR | Status: AC
  Administered 2015-07-15: 80 mg via INTRA_ARTICULAR

## 2015-07-15 MED ORDER — HYDROCODONE-ACETAMINOPHEN 10-325 MG PO TABS: 1.0000 | ORAL_TABLET | Freq: Four times a day (QID) | ORAL | Status: DC | PRN

## 2015-07-15 NOTE — Progress Notes (Signed)
Pre visit review using our clinic review tool, if applicable. No additional management support is needed unless otherwise documented below in the visit note. 

## 2015-07-15 NOTE — Progress Notes (Signed)
Dr. Frederico Hamman T. Starlina Lapre, MD, [facility] Medicine [place], [address] Phone: [phone] Fax: [phone]  07/15/2015  Patient: Emily Moon, MRN: [id number], DOB: 02-Nov-1961, 54 y.o.  Primary Physician:  Arnette Norris, MD  Chief Complaint: Shoulder Pain and Nail Issue  Subjective:   Emily Moon is a 54 y.o. very pleasant female patient who presents with the following:  R shoulder pain. Impingement.  The patient has been having some intermittent impingement type symptoms, more on the right is been ongoing for about 3 months.  She is started to do some rehabilitation in the pool.  She is continuing to take her chronic pain medications.  She also continues to have some back pain and intermittent hip pain, more on the right.  Refill tizanadine  Onychomycosis, B thumbs penlac    06/21/2015 Last OV with Owens Loffler, MD  15 - 20 days ago, cataract surgery. L eye is a little bit blurry.   B shoulder pain.  RTC impingement  Pleasant patient who I know well who is having bilateral shoulder pain when she is abducting and having terminal internal range of motion.  This is been ongoing for a couple of months.  The patient just had cataract surgery about 2 or 3 weeks ago, and she is still in the recovery.  For this.  She has some deep ache in the shoulder itself and pain in a T-shirt distribution.  She has some pain with reaching across her body and with abduction.  She has not been able to do any kind of physical exercise and hasn't been doing any further least greater than 6 months.  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  Past Medical History, Surgical History, Social History, Family History, Problem List, Medications, and Allergies have been reviewed and updated if relevant.  Patient Active Problem List   Diagnosis Date Noted  . Bipolar disorder 05/21/2014    Priority: High  . MULTIPLE SCLEROSIS,  PROGRESSIVE/RELAPSING 08/28/2010    Priority: High  . Chronic pain syndrome 01/07/2011    Priority: Medium  . Back pain 10/31/2014  . 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 Insomnia 09/01/2011  . Obesity 05/04/2011  . Perimenopausal vasomotor symptoms 02/05/2011  . HLD (hyperlipidemia) 01/20/2011  . SMOKER 01/20/2011  . RENAL CALCULUS, RECURRENT 11/13/2010  . GERD 08/28/2010  . OSTEOARTHRITIS 08/28/2010  . NEPHROLITHIASIS, HX OF 08/28/2010    Past Medical History  Diagnosis Date  . Bipolar disorder   . Multiple sclerosis   . Depression   . GERD (gastroesophageal reflux disease)   . Nephrolithiasis   . Osteoporosis     osteoarthritis  . Fibromyalgia syndrome   . 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 Bipolar disorder, unspecified 05/21/2014  . Sleep apnea   . Arthritis   . PONV (postoperative nausea and vomiting)     no problem after cataract surgery  . Heart palpitations   . Edema     feet/legs    Past Surgical History  Procedure Laterality Date  . Tonsillectomy    . Joint replacement Left 2013    hip replacement  . Lithotripsy    . Cataract extraction w/phaco Left 05/21/2015    Procedure: CATARACT EXTRACTION PHACO AND INTRAOCULAR LENS PLACEMENT (IOC);  Surgeon: Birder Robson, MD;  Location: [facility] ORS;  Service: Ophthalmology;  Laterality: Left;  [place] 00:35 AP% 22.9  CDE 8.11 fluid pack lot #[id number] H  . Foot surgery    . Thumb arthroscopy    . Eye surgery      tissue biopsy  . Cataract extraction w/phaco Right 06/04/2015    Procedure: CATARACT EXTRACTION PHACO AND INTRAOCULAR LENS PLACEMENT (IOC);  Surgeon: Birder Robson, MD;  Location: [facility] ORS;  Service: Ophthalmology;  Laterality: Right;  US:00:48 AP%: 10.5 CDE:5.08 Fluid lot #[id number] H    Social History   Social History  . Marital Status: Divorced    Spouse Name: N/A  . Number of Children: 1  .  Years of Education: N/A   Occupational History  . 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 Smoking status: Former Smoker -- 0.50 packs/day for 29 years    Types: Cigarettes  . Smokeless tobacco: Never Used     Comment: recenty 04/26/2014  . Alcohol Use: No  . Drug Use: No  . Sexual Activity: No   Other Topics Concern  . Not on file   Social History Narrative    Family History  Problem Relation Age of Onset  . Cancer Father     Allergies  Allergen Reactions  . Tylenol [Acetaminophen] Shortness Of Breath and Swelling  . Ultram [Tramadol Hcl]   . Aripiprazole   . Cefaclor   . Diclofenac Sodium   . Ibuprofen   . Naproxen Sodium   . Sulfa Antibiotics   . Voltaren [Diclofenac]     Medication list reviewed and updated in full in [place].  GEN: No fevers, chills. Nontoxic. Primarily MSK c/o today. MSK: Detailed in the HPI GI: tolerating PO intake without difficulty Neuro: No numbness, parasthesias, or tingling associated. Otherwise the pertinent positives of the ROS are noted above.   Objective:   BP 90/66 mmHg  Pulse 93  Temp(Src) 98.7 F (37.1 C) (Oral)  Ht 5\' 9"  (1.753 m)  Wt 217 lb (98.431 kg)  BMI 32.03 kg/m2  LMP 08/23/2014   GEN: Well-developed,well-nourished,in no acute distress; alert,appropriate and cooperative throughout examination HEENT: Normocephalic and atraumatic without obvious abnormalities. Ears, externally no deformities PULM: Breathing comfortably in no respiratory distress EXT: No clubbing, cyanosis, or edema PSYCH: Normally interactive. Cooperative during the interview. Pleasant. Friendly and conversant. Not anxious or depressed appearing. Normal, full affect.  Shoulder: B Inspection: No muscle wasting or winging Ecchymosis/edema: neg  AC joint, scapula, clavicle: NT Cervical spine: NT, full ROM Spurling's: neg Abduction: full, 5/5 Flexion: full, 5/5 IR, full, lift-off: 5/5 ER at neutral:  full, 5/5 AC crossover: neg Neer: pos Hawkins: pos Drop Test: neg Empty Can: pos Supraspinatus insertion: mild-mod T Bicipital groove: NT Speed's: neg Yergason's: neg Sulcus sign: neg Scapular dyskinesis: none C5-T1 intact  Neuro: Sensation intact Grip 5/5   Radiology: No results found.  Assessment and Plan:   Right shoulder pain - Plan: methylPREDNISolone acetate (DEPO-MEDROL) injection 80 mg  Impingement syndrome, shoulder, unspecified laterality  Chronic pain syndrome  Refill opiod and inject R shoulder.  Penlac for thumbs  SubAC Injection, R Verbal consent was obtained from the patient. Risks (including rare infection), benefits, and alternatives were explained. Patient prepped with Chloraprep and Ethyl Chloride used for anesthesia. The subacromial space was injected using the posterior approach. The patient tolerated the procedure well and had decreased pain post injection. No complications. Injection: 8 cc of Lidocaine 1% and Depo-Medrol 80 mg. Needle: 22 gauge   New Prescriptions   CICLOPIROX (PENLAC) 8 % SOLUTION    Apply  nightly over nail and surrounding skin. Apply daily over prior coat. After 7 days, may remove with alcohol and continue   No orders of the defined types were placed in this encounter.    Signed,  Maud Deed. Curlee Bogan, MD   Patient's Medications  New Prescriptions   CICLOPIROX (PENLAC) 8 % SOLUTION    Apply nightly over nail and surrounding skin. Apply daily over prior coat. After 7 days, may remove with alcohol and continue  Previous Medications   ASPIRIN 325 MG TABLET    Take 325 mg by mouth as needed.   BIOTIN PO    Take 2,000 mg by mouth daily.    CALCIPOTRIENE-BETAMETHASONE ([place]) OINTMENT    Apply topically daily.   CALCIUM CARBONATE (TUMS - DOSED IN MG ELEMENTAL CALCIUM) 500 MG CHEWABLE TABLET    Chew 3 tablets by mouth daily.   CETIRIZINE (ZYRTEC) 10 MG TABLET    Take 10 mg by mouth daily.   CHOLECALCIFEROL (VITAMIN D-3 PO)     Take 10,000 Units by mouth daily.    CLOBETASOL CREAM (TEMOVATE) 0.05 %    Apply 1 application topically 2 (two) times daily.   DICLOFENAC SODIUM (VOLTAREN) 1 % GEL    Apply 4 g topically 4 (four) times daily.   DULOXETINE (CYMBALTA) 20 MG CAPSULE    Take 40 mg by mouth daily.   FLUTICASONE (FLONASE) 50 MCG/ACT NASAL SPRAY    Place 1 spray into both nostrils daily.   GARCINIA CAMBOGIA-CHROMIUM 500-200 MG-MCG TABS    Take 1 tablet by mouth 2 (two) times daily.   HYDROQUINONE 4 % CREAM    APPLY TO AFFECTED AREAS ON FACE EVERY DAY AT BEDTIME   HYDROXYZINE (VISTARIL) 25 MG CAPSULE    Take 50 mg by mouth at bedtime.    LAMOTRIGINE (LAMICTAL) 150 MG TABLET    Take two tablets by mouth daily   LEVALBUTEROL (XOPENEX HFA) 45 MCG/ACT INHALER    Inhale 1-2 puffs into the lungs every 4 (four) hours as needed.   LYSINE 1000 MG TABS    Take 2,000 mg by mouth daily.    NICOTINE (NICODERM CQ - DOSED IN MG/24 HOURS) 21 MG/24HR PATCH    Place 21 mg onto the skin daily.   OMEGA-3 FATTY ACIDS (FISH OIL PO)    Take 2 capsules by mouth at bedtime.   PHENAZOPYRIDINE HCL (AZO TABS PO)    Take 2 tablets by mouth as needed.    POLYETHYLENE GLYCOL POWDER (GLYCOLAX/MIRALAX) POWDER    MIX 17 GRAMS (1 CAPFUL) WITH 4-8 OZ OF LIQUID AND TAKE BY MOUTH TWICE DAILY AS NEEDED   PROBIOTIC PRODUCT (PROBIOTIC PO)    Take 1 tablet by mouth daily.   RANITIDINE (ZANTAC) 150 MG TABLET    Take 150 mg by mouth 2 (two) times daily as needed for heartburn.   TEMAZEPAM (RESTORIL) 30 MG CAPSULE    TAKE ONE TO TWO CAPSULES BY MOUTH NIGHTLY AT BEDTIME AS NEEDED   Modified Medications   Modified Medication Previous Medication   HYDROCODONE-ACETAMINOPHEN (NORCO) 10-325 MG PER TABLET HYDROcodone-acetaminophen (NORCO) 10-325 MG per tablet      Take 1 tablet by mouth every 6 (six) hours as needed.    Take 1 tablet by mouth every 6 (six) hours as needed.   TIZANIDINE (ZANAFLEX) 4 MG TABLET tiZANidine (ZANAFLEX) 4 MG tablet      Take 1 tablet (4 mg  total) by mouth every 6 (six) hours as needed.    Take  1 tablet (4 mg total) by mouth every 6 (six) hours as needed.  Discontinued Medications   DESONIDE (DESOWEN) 0.05 % CREAM       DUREZOL 0.05 % EMUL       HYDROXYZINE (ATARAX/VISTARIL) 25 MG TABLET    TAKE TWO TABLETS BY MOUTH NIGHTLY AT BEDTIME    ILEVRO 0.3 % OPHTHALMIC SUSPENSION       MYRBETRIQ 25 MG TB24 TABLET    TAKE ONE TABLET BY MOUTH ONE TIME DAILY

## 2015-07-29 ENCOUNTER — Telehealth: Payer: Self-pay | Admitting: Primary Care

## 2015-07-29 ENCOUNTER — Telehealth: Payer: Self-pay | Admitting: Family Medicine

## 2015-07-29 NOTE — Telephone Encounter (Signed)
Patient would like to switch from Dr.Aron to [facility].  Patient said she's heard wonderful things about [facility] and would like to switch.  Can patient switch to [facility]?

## 2015-07-29 NOTE — Telephone Encounter (Signed)
Fine with me as long as it's okay with Dr. Deborra Medina.  Thanks.

## 2015-07-30 NOTE — Telephone Encounter (Signed)
Yes ok with me

## 2015-08-12 ENCOUNTER — Ambulatory Visit: Payer: Self-pay | Admitting: Family Medicine

## 2015-08-12 ENCOUNTER — Other Ambulatory Visit: Payer: Self-pay | Admitting: *Deleted

## 2015-08-12 MED ORDER — HYDROCODONE-ACETAMINOPHEN 10-325 MG PO TABS: 1.0000 | ORAL_TABLET | Freq: Four times a day (QID) | ORAL | Status: DC | PRN

## 2015-08-12 NOTE — Telephone Encounter (Signed)
Patient left a voicemail requesting a refill on her Hydrocodone from Dr. Lorelei Pont. Patient stated that she is still having shoulder pain and he gave her the last script for this. Last refill 07/15/15. Call when ready for pickup.

## 2015-08-12 NOTE — Telephone Encounter (Signed)
Left message for Emily Moon that her prescription is ready to be picked up at the front desk.

## 2015-09-09 ENCOUNTER — Ambulatory Visit (INDEPENDENT_AMBULATORY_CARE_PROVIDER_SITE_OTHER): Payer: Medicare Other | Admitting: Family Medicine

## 2015-09-09 ENCOUNTER — Encounter: Payer: Self-pay | Admitting: Family Medicine

## 2015-09-09 VITALS — BP 104/76 | HR 112 | Temp 98.3°F | Ht 69.0 in | Wt 218.5 lb

## 2015-09-09 DIAGNOSIS — M159 Polyosteoarthritis, unspecified: Secondary | ICD-10-CM | POA: Diagnosis not present

## 2015-09-09 DIAGNOSIS — F3170 Bipolar disorder, currently in remission, most recent episode unspecified: Secondary | ICD-10-CM

## 2015-09-09 DIAGNOSIS — Q6589 Other specified congenital deformities of hip: Secondary | ICD-10-CM

## 2015-09-09 DIAGNOSIS — M1631 Unilateral osteoarthritis resulting from hip dysplasia, right hip: Secondary | ICD-10-CM

## 2015-09-09 DIAGNOSIS — G35 Multiple sclerosis: Secondary | ICD-10-CM

## 2015-09-09 DIAGNOSIS — G894 Chronic pain syndrome: Secondary | ICD-10-CM | POA: Diagnosis not present

## 2015-09-09 MED ORDER — HYDROCODONE-ACETAMINOPHEN 10-325 MG PO TABS: 1.0000 | ORAL_TABLET | Freq: Four times a day (QID) | ORAL | Status: DC | PRN

## 2015-09-09 NOTE — Progress Notes (Signed)
Pre visit review using our clinic review tool, if applicable. No additional management support is needed unless otherwise documented below in the visit note. 

## 2015-09-09 NOTE — Progress Notes (Signed)
Dr. Frederico Hamman T. Rakeya Glab, MD, [facility] and Sports Medicine [place], [address] Phone: [phone] Fax: [phone]  09/09/2015  Patient: Emily Moon, MRN: [id number], DOB: [birth year], 54 y.o.  Primary Physician:  Sheral Flow, NP   Chief Complaint  Patient presents with  . Thumb Nail Issue    Right Thumb  . Urinary Frequency  . Medication Refill    Pain Medicine  . Pain in Joints  . Form    from S.S   Subjective:   Emily Moon is a 54 y.o. very pleasant female patient who presents with the following:  Multiple ongoing issues.   Regarding the patient, Emily Moon: I do not think she can work or carry a full-time job given multiple limitations. No lifting, limited multiple joints - diffuse osteoarthritis, MS also leaving her impaired vision, shoulder pain, significant hip pain and OA, frequent urination - all the time.  Urination approx every 10 minutes failing multiple urological recommendations and plans. She also has Bipolar illness.  I do not think that she can reenter the work-force with all of her many conditions.  Knees are swelling. And not feeling well. They're hurting, generally bothering her, and having some intermittent effusions.  R thumbnail. Scaling off. She still has a fungus, she's been using Penlac.  Aspercreme with lidocaine. She is been using this much of her multiple complaints and joint issues, this seems to be helping quite a bit.  Past Medical History, Surgical History, Social History, Family History, Problem List, Medications, and Allergies have been reviewed and updated if relevant.  Patient Active Problem List   Diagnosis Date Noted  . Bipolar disorder ([place]) 05/21/2014    Priority: High  . MULTIPLE SCLEROSIS, PROGRESSIVE/RELAPSING 08/28/2010    Priority: High  . Chronic pain syndrome 01/07/2011    Priority: Medium  . Back pain 10/31/2014  . Deformity of right foot  12/27/2013  . 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 Insomnia 09/01/2011  . Obesity 05/04/2011  . Perimenopausal vasomotor symptoms 02/05/2011  . HLD (hyperlipidemia) 01/20/2011  . SMOKER 01/20/2011  . RENAL CALCULUS, RECURRENT 11/13/2010  . GERD 08/28/2010  . OSTEOARTHRITIS 08/28/2010  . NEPHROLITHIASIS, HX OF 08/28/2010    Past Medical History  Diagnosis Date  . Bipolar disorder ([place])   . Multiple sclerosis ([place])   . Depression   . GERD (gastroesophageal reflux disease)   . Nephrolithiasis   . Osteoporosis     osteoarthritis  . Fibromyalgia syndrome   . 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 Bipolar disorder, unspecified ([place]) 05/21/2014  . Sleep apnea   . Arthritis   . PONV (postoperative nausea and vomiting)     no problem after cataract surgery  . Heart palpitations   . Edema     feet/legs    Past Surgical History  Procedure Laterality Date  . Tonsillectomy    . Joint replacement Left 2013    hip replacement  . Lithotripsy    . Cataract extraction w/phaco Left 05/21/2015    Procedure: CATARACT EXTRACTION PHACO AND INTRAOCULAR LENS PLACEMENT (IOC);  Surgeon: Birder Robson, MD;  Location: [facility] ORS;  Service: Ophthalmology;  Laterality: Left;  [place] 00:35 AP% 22.9 CDE 8.11 fluid pack lot #[id number]  . Foot surgery    . Thumb arthroscopy    . Eye surgery      tissue biopsy  . Cataract extraction w/phaco Right 06/04/2015  Procedure: CATARACT EXTRACTION PHACO AND INTRAOCULAR LENS PLACEMENT (IOC);  Surgeon: Birder Robson, MD;  Location: [facility] ORS;  Service: Ophthalmology;  Laterality: Right;  US:00:48 AP%: 10.5 CDE:5.08 Fluid lot #[id number]    Social History   Social History  . Marital Status: Divorced    Spouse Name: N/A  . Number of Children: 1  . Years of Education: N/A   Occupational History  . 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 Smoking status:  Former Smoker -- 0.50 packs/day for 29 years    Types: Cigarettes  . Smokeless tobacco: Never Used     Comment: recenty 04/26/2014  . Alcohol Use: No  . Drug Use: No  . Sexual Activity: No   Other Topics Concern  . Not on file   Social History Narrative    Family History  Problem Relation Age of Onset  . Cancer Father     Allergies  Allergen Reactions  . Tylenol [Acetaminophen] Shortness Of Breath and Swelling  . Ultram [Tramadol Hcl]   . Aripiprazole   . Cefaclor   . Diclofenac Sodium   . Ibuprofen   . Naproxen Sodium   . Sulfa Antibiotics   . Voltaren [Diclofenac]     Medication list reviewed and updated in full in [place].  GEN: No fevers, chills. Nontoxic. Primarily MSK c/o today. MSK: Detailed in the HPI GI: tolerating PO intake without difficulty Neuro: No numbness, parasthesias, or tingling associated. Otherwise the pertinent positives of the ROS are noted above.   Objective:   BP 104/76 mmHg  Pulse 112  Temp(Src) 98.3 F (36.8 C) (Oral)  Ht 5\' 9"  (1.753 m)  Wt 218 lb 8 oz (99.111 kg)  BMI 32.25 kg/m2  LMP 08/23/2014   GEN: WDWN, NAD, Non-toxic, A & O x 3 HEENT: Atraumatic, Normocephalic. Neck supple. No masses, No LAD. Ears and Nose: No external deformity. CV: RRR, No M/G/R. No JVD. No thrill. No extra heart sounds. PULM: CTA B, no wheezes, crackles, rhonchi. No retractions. No resp. distress. No accessory muscle use. EXTR: No c/c/e NEURO Normal gait.  PSYCH: Normally interactive. Conversant. Not depressed or anxious appearing.  Calm demeanor.    Shoulder: R Inspection: No muscle wasting or winging Ecchymosis/edema: neg  AC joint, scapula, clavicle: NT Cervical spine: NT, full ROM Spurling's: neg Abduction: full, 5/5 Flexion: full, 5/5 IR, full, lift-off: 5/5 ER at neutral: full, 5/5 AC crossover and compression: neg Neer: neg Hawkins: neg Drop Test: neg Empty Can: neg Supraspinatus insertion: NT Bicipital groove:  NT Speed's: neg Yergason's: neg Sulcus sign: neg Scapular dyskinesis: none C5-T1 intact Sensation intact Grip 5/5   Knee:  B Gait: Normal heel toe pattern ROM: 0-120 Effusion: mild Echymosis or edema: none Patellar tendon NT Painful PLICA: neg Patellar grind: negative Medial and lateral patellar facet loading: negative medial and lateral joint lines:NT Mcmurray's neg Flexion-pinch neg Varus and valgus stress: stable Lachman: neg Ant and Post drawer: neg Hip abduction, IR, ER: WNL Hip flexion str: 5/5 Hip abd: 5/5 Quad: 5/5 VMO atrophy:No Hamstring concentric and eccentric: 5/5   Radiology: No results found.  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  Multiple joints with osteoarthritis with mild-to-moderate flareups today.  For now, continue with her current regimen, try to be more active, keep in the pool, and she can use some pain medication if needed.  Hip is a little bit more painful to worsening.  MS, continue following with her neurologist.  Additional time spent in discussion of disability.  Follow-up: prn, f/u with new PCP  New Prescriptions   No medications on file   Modified Medications   Modified Medication Previous Medication   HYDROCODONE-ACETAMINOPHEN (NORCO) 10-325 MG TABLET HYDROcodone-acetaminophen (NORCO) 10-325 MG per tablet      Take 1 tablet by mouth every 6 (six) hours as needed.    Take 1 tablet by mouth every 6 (six) hours as needed.   No orders of the defined types were placed in this encounter.    Signed,  Maud Deed. Mireyah Chervenak, MD   Patient's Medications   New Prescriptions   No medications on file  Previous Medications   ASPERCREME LIDOCAINE EX    Apply topically.   ASPIRIN 325 MG TABLET    Take 325 mg by mouth as needed.   BIOTIN PO    Take 2,000 mg by mouth daily.    CALCIPOTRIENE-BETAMETHASONE (TACLONEX) OINTMENT    Apply topically daily.   CALCIUM CARBONATE (TUMS - DOSED IN MG ELEMENTAL CALCIUM) 500 MG CHEWABLE TABLET    Chew 3 tablets by mouth daily.   CETIRIZINE (ZYRTEC) 10 MG TABLET    Take 10 mg by mouth daily.   CHOLECALCIFEROL (VITAMIN D-3 PO)    Take 10,000 Units by mouth daily.    CICLOPIROX (PENLAC) 8 % SOLUTION    Apply nightly over nail and surrounding skin. Apply daily over prior coat. After 7 days, may remove with alcohol and continue   CLOBETASOL CREAM (TEMOVATE) 0.05 %    Apply 1 application topically 2 (two) times daily.   DICLOFENAC SODIUM (VOLTAREN) 1 % GEL    Apply 4 g topically 4 (four) times daily.   DULOXETINE (CYMBALTA) 20 MG CAPSULE    Take 40 mg by mouth daily.   FLUTICASONE (FLONASE) 50 MCG/ACT NASAL SPRAY    Place 1 spray into both nostrils daily.   GARCINIA CAMBOGIA-CHROMIUM 500-200 MG-MCG TABS    Take 1 tablet by mouth 2 (two) times daily.   HYDROQUINONE 4 % CREAM    APPLY TO AFFECTED AREAS ON FACE EVERY DAY AT BEDTIME   HYDROXYZINE (VISTARIL) 25 MG CAPSULE    Take 50 mg by mouth at bedtime.    LAMOTRIGINE (LAMICTAL) 150 MG TABLET    Take two tablets by mouth daily   LEVALBUTEROL (XOPENEX HFA) 45 MCG/ACT INHALER    Inhale 1-2 puffs into the lungs every 4 (four) hours as needed.   LYSINE 1000 MG TABS    Take 2,000 mg by mouth daily.    NICOTINE (NICODERM CQ - DOSED IN MG/24 HOURS) 21 MG/24HR PATCH    Place 21 mg onto the skin daily.   OMEGA-3 FATTY ACIDS (FISH OIL PO)    Take 2 capsules by mouth at bedtime.   PHENAZOPYRIDINE HCL (AZO TABS PO)    Take 2 tablets by mouth as needed.    POLYETHYLENE GLYCOL POWDER (GLYCOLAX/MIRALAX) POWDER    MIX 17 GRAMS (1 CAPFUL) WITH 4-8 OZ OF LIQUID AND TAKE BY MOUTH TWICE  DAILY AS NEEDED   PROBIOTIC PRODUCT (PROBIOTIC PO)    Take 1 tablet by mouth daily.   RANITIDINE (ZANTAC) 150 MG TABLET  Take 150 mg by mouth 2 (two) times daily as needed for heartburn.   TEMAZEPAM (RESTORIL) 30 MG CAPSULE    TAKE ONE TO TWO CAPSULES BY MOUTH NIGHTLY AT BEDTIME AS NEEDED    TIZANIDINE (ZANAFLEX) 4 MG TABLET    Take 1 tablet (4 mg total) by mouth every 6 (six) hours as needed.  Modified Medications   Modified Medication Previous Medication   HYDROCODONE-ACETAMINOPHEN (NORCO) 10-325 MG TABLET HYDROcodone-acetaminophen (NORCO) 10-325 MG per tablet      Take 1 tablet by mouth every 6 (six) hours as needed.    Take 1 tablet by mouth every 6 (six) hours as needed.  Discontinued Medications   No medications on file

## 2015-09-18 ENCOUNTER — Ambulatory Visit: Payer: Self-pay | Admitting: Podiatry

## 2015-09-20 ENCOUNTER — Other Ambulatory Visit: Payer: Self-pay | Admitting: Family Medicine

## 2015-09-20 ENCOUNTER — Other Ambulatory Visit: Payer: Self-pay

## 2015-09-20 MED ORDER — POLYETHYLENE GLYCOL 3350 17 GM/SCOOP PO POWD: ORAL | Status: DC

## 2015-09-20 NOTE — Telephone Encounter (Signed)
Pt left v/m requesting refill mir[facility] to [facility] [address] [place]. rx last refilled 527 g on 06/24/15. Pt last seen 09/09/15 and has med wellness with Allie Bossier NP on 10/25/15. Is it OK to refill?

## 2015-09-20 NOTE — Telephone Encounter (Signed)
Electronically refill request for   polyethylene glycol powder (GLYCOLAX/MIRALAX) powder   MIX 17 GRAMS (1 CAPFUL) WITH 4-8 OZ OF LIQUID AND TAKE BY MOUTH TWICE DAILY AS NEEDED  Dispense: 527 g   Refills: 0     Last prescribed on 06/24/2015. Last seen on 04/22/2015. Next appt on 10/25/2015.

## 2015-10-07 ENCOUNTER — Telehealth: Payer: Self-pay | Admitting: Family Medicine

## 2015-10-07 ENCOUNTER — Other Ambulatory Visit: Payer: Self-pay

## 2015-10-07 MED ORDER — HYDROCODONE-ACETAMINOPHEN 10-325 MG PO TABS: 1.0000 | ORAL_TABLET | Freq: Four times a day (QID) | ORAL | Status: DC | PRN

## 2015-10-07 NOTE — Telephone Encounter (Signed)
Tyshia notified prescription is ready to be picked up at the front desk.

## 2015-10-07 NOTE — Telephone Encounter (Signed)
Pt left v/m requesting rx hydrocodone apap. Call when ready for pick up.pt last seen and rx last printed # 90 on 09/09/15.  Pt request to pick up rx today; pt or her sister, Danton Clap cochran would pick up rx.pt will be going out of town on 10/09/15.

## 2015-10-07 NOTE — Telephone Encounter (Signed)
[facility] Patient Name: MAYDELIN PENRY DOB: 29-Apr-1961 Initial Comment Caller states woke up with serious pain on right side; it was on the lower side but now is moving higher; Nurse Assessment Nurse: Mechele Dawley, RN, Amy Date/Time (Eastern Time): 10/07/2015 9:31:00 AM Confirm and document reason for call. If symptomatic, describe symptoms. ---PAIN ON THE RIGHT SIDE. STARTED ON THE [place] AREA. SHE IS 9/10. SHE TOOK SOME GAS MEDS. SOME ADVIL, PAIN MEDS, MUSCLE RELAXER. RIGHT HAND SIDE FLANK. SHE IS NAUSEATED. SHE DRANK SOME VINEGAR. PAIN IS STARTED AT [place] ON THE RIGHT SIDE AND TRAVELS UPWARD. FEELS LIKE SHE IS HAVING LABOR PAINS. SHE HAS SOME CRAMPING. SHE STATES THE PAIN IS CONSTANT X 0430. SHE STATES SHE DID HAVE RELIEF FOR 15 MINUTES. NAUSEATED FOR SURE. NO DIARRHEA. Has the patient traveled out of the country within the last 30 days? ---Not Applicable Does the patient have any new or worsening symptoms? ---Yes Will a triage be completed? ---Yes Related visit to physician within the last 2 weeks? ---No Does the PT have any chronic conditions? (i.e. diabetes, asthma, etc.) ---Yes List chronic conditions. ---KIDNEY STONES, MS, ARTHRITIS, DDD, Did the patient indicate they were pregnant? ---No Is this a behavioral health call? ---No Guidelines Guideline Title Affirmed Question Affirmed Notes Flank Pain [1] SEVERE pain (e.g., excruciating, scale 8-10) AND [2] present > 1 hour Final Disposition User Go to ED Now [place], Therapist, sports, [facility] - ED Disagree/Comply: Comply

## 2015-10-07 NOTE — Telephone Encounter (Signed)
Patient has yet to establish with PCP. Will fill for now.

## 2015-10-07 NOTE — Telephone Encounter (Signed)
I'm confused. What RX did she pick up? Also, it sounds like she needs to be evaluated either in our office or the ED.

## 2015-10-07 NOTE — Telephone Encounter (Signed)
Pt called stating she could not get transportation to hospital And could not afford ambulance She passed one stone She stated she has more to pass She has temp  Of 100. i let her know her rx is ready for pick up

## 2015-10-08 NOTE — Telephone Encounter (Signed)
Dr Lorelei Pont wrote a rx for hydrocodone

## 2015-10-17 ENCOUNTER — Other Ambulatory Visit: Payer: Self-pay | Admitting: Primary Care

## 2015-10-17 DIAGNOSIS — Z0000 Encounter for general adult medical examination without abnormal findings: Secondary | ICD-10-CM

## 2015-10-17 DIAGNOSIS — Z780 Asymptomatic menopausal state: Secondary | ICD-10-CM

## 2015-10-17 DIAGNOSIS — E785 Hyperlipidemia, unspecified: Secondary | ICD-10-CM

## 2015-10-18 ENCOUNTER — Other Ambulatory Visit (INDEPENDENT_AMBULATORY_CARE_PROVIDER_SITE_OTHER): Payer: Medicare Other

## 2015-10-18 DIAGNOSIS — Z0000 Encounter for general adult medical examination without abnormal findings: Secondary | ICD-10-CM | POA: Diagnosis not present

## 2015-10-18 DIAGNOSIS — E785 Hyperlipidemia, unspecified: Secondary | ICD-10-CM | POA: Diagnosis not present

## 2015-10-18 LAB — COMPREHENSIVE METABOLIC PANEL
ALBUMIN: 3.8 g/dL (ref 3.5–5.2)
ALT: 24 U/L (ref 0–35)
AST: 18 U/L (ref 0–37)
Alkaline Phosphatase: 60 U/L (ref 39–117)
BILIRUBIN TOTAL: 0.3 mg/dL (ref 0.2–1.2)
BUN: 15 mg/dL (ref 6–23)
CALCIUM: 9.3 mg/dL (ref 8.4–10.5)
CHLORIDE: 101 meq/L (ref 96–112)
CO2: 29 mEq/L (ref 19–32)
CREATININE: 0.77 mg/dL (ref 0.40–1.20)
GFR: 83.18 mL/min (ref >60.00)
Glucose, Bld: 105 mg/dL — ABNORMAL HIGH (ref 70–99)
Potassium: 4.2 mEq/L (ref 3.5–5.1)
Sodium: 140 mEq/L (ref 135–145)
Total Protein: 6.5 g/dL (ref 6.0–8.3)

## 2015-10-18 LAB — VITAMIN D 25 HYDROXY (VIT D DEFICIENCY, FRACTURES): VITD: 47.87 ng/mL (ref 30.00–100.00)

## 2015-10-18 LAB — CBC
HCT: 39.3 % (ref 36.0–46.0)
Hemoglobin: 12.9 g/dL (ref 12.0–15.0)
MCHC: 32.8 g/dL (ref 30.0–36.0)
MCV: 95.2 fl (ref 78.0–100.0)
Platelets: 366 10*3/uL (ref 150.0–400.0)
RBC: 4.13 Mil/uL (ref 3.87–5.11)
RDW: 13.8 % (ref 11.5–15.5)
WBC: 7 10*3/uL (ref 4.0–10.5)

## 2015-10-18 LAB — LDL CHOLESTEROL, DIRECT: LDL DIRECT: 207 mg/dL

## 2015-10-18 LAB — TSH: TSH: 2.03 u[IU]/mL (ref 0.35–4.50)

## 2015-10-18 LAB — LIPID PANEL
Cholesterol: 306 mg/dL — ABNORMAL HIGH (ref 0–200)
HDL: 42.5 mg/dL (ref >39.00)
NonHDL: 263.84
TRIGLYCERIDES: 346 mg/dL — AB (ref 0.0–149.0)
Total CHOL/HDL Ratio: 7
VLDL: 69.2 mg/dL — AB (ref 0.0–40.0)

## 2015-10-25 ENCOUNTER — Ambulatory Visit (INDEPENDENT_AMBULATORY_CARE_PROVIDER_SITE_OTHER): Payer: Medicare Other | Admitting: Primary Care

## 2015-10-25 ENCOUNTER — Encounter: Payer: Self-pay | Admitting: Primary Care

## 2015-10-25 VITALS — BP 116/82 | HR 88 | Temp 98.5°F | Ht 69.0 in | Wt 222.8 lb

## 2015-10-25 DIAGNOSIS — M159 Polyosteoarthritis, unspecified: Secondary | ICD-10-CM

## 2015-10-25 DIAGNOSIS — R109 Unspecified abdominal pain: Secondary | ICD-10-CM | POA: Diagnosis not present

## 2015-10-25 DIAGNOSIS — G35 Multiple sclerosis: Secondary | ICD-10-CM

## 2015-10-25 DIAGNOSIS — N200 Calculus of kidney: Secondary | ICD-10-CM

## 2015-10-25 DIAGNOSIS — F3170 Bipolar disorder, currently in remission, most recent episode unspecified: Secondary | ICD-10-CM

## 2015-10-25 DIAGNOSIS — Z0000 Encounter for general adult medical examination without abnormal findings: Secondary | ICD-10-CM | POA: Diagnosis not present

## 2015-10-25 DIAGNOSIS — K219 Gastro-esophageal reflux disease without esophagitis: Secondary | ICD-10-CM

## 2015-10-25 DIAGNOSIS — E669 Obesity, unspecified: Secondary | ICD-10-CM

## 2015-10-25 DIAGNOSIS — E785 Hyperlipidemia, unspecified: Secondary | ICD-10-CM

## 2015-10-25 DIAGNOSIS — G4700 Insomnia, unspecified: Secondary | ICD-10-CM

## 2015-10-25 MED ORDER — TAMSULOSIN HCL 0.4 MG PO CAPS: 0.4000 mg | ORAL_CAPSULE | Freq: Every day | ORAL | Status: DC

## 2015-10-25 NOTE — Progress Notes (Signed)
Patient ID: Emily Moon, female   DOB: 03-22-1961, 54 y.o.   MRN: [id number]  HPI: Emily Moon is a 54 year old patient, new to me, who presents today for [place].  Past Medical History  Diagnosis Date  . Bipolar disorder ([place])   . Multiple sclerosis ([place])   . Depression   . GERD (gastroesophageal reflux disease)   . Nephrolithiasis   . Osteoporosis     osteoarthritis  . Fibromyalgia syndrome   . 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 Bipolar disorder, unspecified ([place]) 05/21/2014  . Sleep apnea   . Arthritis   . PONV (postoperative nausea and vomiting)     no problem after cataract surgery  . Heart palpitations   . Edema     feet/legs    Current Outpatient Prescriptions  Medication Sig Dispense Refill  . ASPERCREME LIDOCAINE EX Apply topically.    Marland Kitchen aspirin 325 MG tablet Take 325 mg by mouth as needed.    Marland Kitchen BIOTIN PO Take 2,000 mg by mouth daily.     . calcipotriene-betamethasone (TACLONEX) ointment Apply topically daily. 60 g 0  . calcium carbonate (TUMS - DOSED IN MG ELEMENTAL CALCIUM) 500 MG chewable tablet Chew 3 tablets by mouth daily.    . cetirizine (ZYRTEC) 10 MG tablet Take 10 mg by mouth daily.    . Cholecalciferol (VITAMIN D-3 PO) Take 10,000 Units by mouth daily.     . ciclopirox (PENLAC) 8 % solution Apply nightly over nail and surrounding skin. Apply daily over prior coat. After 7 days, may remove with alcohol and continue 6.6 mL 3  . clobetasol cream (TEMOVATE) [id number] % Apply 1 application topically 2 (two) times daily. 30 g 2  . diclofenac sodium (VOLTAREN) 1 % GEL Apply 4 g topically 4 (four) times daily. 500 g 5  . DULoxetine (CYMBALTA) 20 MG capsule Take 40 mg by mouth daily.  4  . fluticasone (FLONASE) 50 MCG/ACT nasal spray Place 1 spray into both nostrils daily.    . Garcinia Cambogia-Chromium 500-200 MG-MCG TABS Take 1 tablet by mouth 2 (two) times daily.    Marland Kitchen HYDROcodone-acetaminophen (NORCO) 10-325 MG tablet Take 1 tablet by mouth every 6 (six) hours as  needed. 90 tablet 0  . hydroquinone 4 % cream APPLY TO AFFECTED AREAS ON FACE EVERY DAY AT BEDTIME  3  . hydrOXYzine (VISTARIL) 25 MG capsule Take 50 mg by mouth at bedtime.     . lamoTRIgine (LAMICTAL) 150 MG tablet Take two tablets by mouth daily 180 tablet 3  . levalbuterol (XOPENEX HFA) 45 MCG/ACT inhaler Inhale 1-2 puffs into the lungs every 4 (four) hours as needed. 1 Inhaler 0  . Lysine 1000 MG TABS Take 2,000 mg by mouth daily.     . nicotine (NICODERM CQ - DOSED IN MG/24 HOURS) 21 mg/24hr patch Place 21 mg onto the skin daily.    . Omega-3 Fatty Acids (FISH OIL PO) Take 2 capsules by mouth at bedtime.    Marland Kitchen Phenazopyridine HCl (AZO TABS PO) Take 2 tablets by mouth as needed.     . polyethylene glycol powder (GLYCOLAX/MIRALAX) powder MIX 17 GRAMS (1 CAPFUL) WITH 4-8 OZ OF LIQUID AND TAKE BY MOUTH TWICE DAILY AS NEEDED 527 g 0  . Probiotic Product (PROBIOTIC PO) Take 1 tablet by mouth daily.    . ranitidine (ZANTAC) 150 MG tablet Take 150 mg by mouth 2 (two) times daily as needed for heartburn.    . temazepam (RESTORIL) 30 MG capsule  TAKE ONE TO TWO CAPSULES BY MOUTH NIGHTLY AT BEDTIME AS NEEDED  60 capsule 0  . tiZANidine (ZANAFLEX) 4 MG tablet Take 1 tablet (4 mg total) by mouth every 6 (six) hours as needed. 60 tablet 5   No current facility-administered medications for this visit.    Allergies  Allergen Reactions  . Tylenol [Acetaminophen] Shortness Of Breath and Swelling  . Ultram [Tramadol Hcl]   . Aripiprazole   . Cefaclor   . Diclofenac Sodium   . Ibuprofen   . Naproxen Sodium   . Sulfa Antibiotics   . Voltaren [Diclofenac]     Family History  Problem Relation Age of Onset  . Cancer Father     Social History   Social History  . Marital Status: Divorced    Spouse Name: N/A  . Number of Children: 1  . Years of Education: N/A   Occupational History  . 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 Smoking status: Former Smoker --  0.50 packs/day for 29 years    Types: Cigarettes  . Smokeless tobacco: Never Used     Comment: recenty 04/26/2014  . Alcohol Use: No  . Drug Use: No  . Sexual Activity: No   Other Topics Concern  . Not on file   Social History Narrative    Hospitiliaztions:  Health Maintenance:    Flu: Completed in August 2016  Tetanus: Unsure. Declines today.  Pneumovax: Completed in August 2016  Prevnar: Completed in August 2015  Zostavax: Never completed.   Bone Density: Never completed, declines.  Colonoscopy: Never completed. Discussed IFOB testing.   Eye Doctor: Completed in 2015  Dental Exam: Completes semi-annually.   Mammogram:Complted in August 2015  Pap: Completed in August 2015    Providers: Dr. Lorelei Pont, ortho; Dr. Benna Dunks, dentist; Dr. Casimiro Needle, Dr. Leatha Gilding, [place]; Dermatology in [place], [name], PCP   I have personally reviewed and have noted: 1. The patient's medical and social history 2. Their use of alcohol, tobacco or illicit drugs 3. Their current medications and supplements 4. The patient's functional ability including ADL's, fall risks, home  safety risks and hearing or visual impairment. 5. Diet and physical activities 6. Evidence for depression or mood disorder  Subjective:   Review of Systems:   Constitutional: Denies fever, malaise, fatigue, headache or abrupt weight changes.  HEENT: Denies eye pain, eye redness, ear pain, ringing in the ears, wax buildup, runny nose, nasal congestion, bloody nose, or sore throat. Does have decrease in peripheral vision, currently followed by opthalmology.  Respiratory: Denies difficulty breathing, shortness of breath, cough or sputum production.   Cardiovascular: Denies chest pain, chest tightness, palpitations or swelling in the hands or feet.  Gastrointestinal: Denies abdominal pain. She's recently noticed some bloating. She does have constipation with small bowel movements every other day. GU: Denies  urgency, vaginal symptoms, and hematuria. She does have frequency most days which is chronic. Right flank pain intermittently x several weeks, overall improved with some dull pain now. Musculoskeletal: Decrease in overall strength. Chronic osteoarthritis. Overall some difficulty with gait. Skin: Denies redness, rashes, lesions or ulcercations.  Neurological: Occasional dizziness, difficulty with memory, difficulty with speech and problems with balance and coordination. History of MS.  No other specific complaints in a complete review of systems (except as listed in HPI above).  Objective:  PE:   LMP 08/23/2014 Wt Readings from Last 3 Encounters:  09/09/15 218 lb 8 oz (99.111 kg)  07/15/15 217  lb (98.431 kg)  06/21/15 217 lb 12 oz (98.771 kg)    General: Appears their stated age, well developed, well nourished in NAD. Skin: Warm, dry and intact. No rashes, lesions or ulcerations noted. HEENT: Head: normal shape and size; Eyes: sclera white, no icterus, conjunctiva pink, PERRLA and EOMs intact; Ears: Tm's gray and intact, normal light reflex; Nose: mucosa pink and moist, septum midline; Throat/Mouth: Teeth present, mucosa pink and moist, no exudate, lesions or ulcerations noted.  Neck: Normal range of motion. Neck supple, trachea midline. No massses, lumps or thyromegaly present.  Cardiovascular: Normal rate and rhythm. S1,S2 noted.  No murmur, rubs or gallops noted. No JVD or BLE edema. No carotid bruits noted. Pulmonary/Chest: Normal effort and positive vesicular breath sounds. No respiratory distress. No wheezes, rales or ronchi noted.  Abdomen: Soft and nontender. Normal bowel sounds, no bruits noted. Slight distention noted. Liver, spleen and kidneys non palpable. Musculoskeletal: Normal range of motion. No signs of joint swelling. No difficulty with gait. Slight decrease in strength. Neurological: Alert and oriented. Cranial nerves II-XII intact. Coordination normal. Patellar DTRs are  hyper-reflexive bilaterally. Psychiatric: Mood and affect normal. Behavior is normal. Judgment and thought content normal. Long history of Bipolar disorder.   BMET    Component Value Date/Time   NA 140 10/18/2015 1017   K 4.2 10/18/2015 1017   CL 101 10/18/2015 1017   CO2 29 10/18/2015 1017   GLUCOSE 105* 10/18/2015 1017   BUN 15 10/18/2015 1017   CREATININE 0.77 10/18/2015 1017   CALCIUM 9.3 10/18/2015 1017    Lipid Panel     Component Value Date/Time   CHOL 306* 10/18/2015 1017   TRIG 346.0* 10/18/2015 1017   HDL 42.50 10/18/2015 1017   CHOLHDL 7 10/18/2015 1017   VLDL 69.2* 10/18/2015 1017   LDLCALC 223* 11/13/2014 0908    CBC    Component Value Date/Time   WBC 7.0 10/18/2015 1017   RBC 4.13 10/18/2015 1017   HGB 12.9 10/18/2015 1017   HCT 39.3 10/18/2015 1017   PLT 366.0 10/18/2015 1017   MCV 95.2 10/18/2015 1017   MCHC 32.8 10/18/2015 1017   RDW 13.8 10/18/2015 1017   LYMPHSABS 2.3 11/13/2014 0908   MONOABS 0.4 11/13/2014 0908   EOSABS 0.2 11/13/2014 0908   BASOSABS 0.1 11/13/2014 0908    Hgb A1C No results found for: HGBA1C    Assessment and Plan:   Medicare Annual Wellness Visit:  Diet: Poor diet. Fast food consumption, skips meals, junk food. Physical activity: She does not currently exercise. Depression/mood screen: Currently managed by psych. Hearing: Intact to whispered voice Visual acuity: Grossly normal, decrease in peripheral vision, performs annual eye exam. Recent cataract surgery. ADLs: Capable Fall risk: Yes, discussed prevention. Home safety: Good Cognitive evaluation: Intact to orientation, naming. Recalled 1 of 3 items. EOL planning: Working on advanced directives, full code, but considering DNR.  Preventative Medicine: Declines tdap and treatment for hyperlipidemia. Discussed the importance of a healthy diet and regular exercise in order for weight loss and to reduce risk of other medical diseases.   Next appointment: 6  months.

## 2015-10-25 NOTE — Assessment & Plan Note (Signed)
Does not follow with Neurologist, declines referral.  Does not want to be on medication or go through imaging. Overall decline in status with weakness, speech, handwriting. Lives with sister who handles her finances.  Overall good strength, decrease with flexion to lower extremities. Discussed that management of MS is above my scope of practice and urged her to think about referral for management especially as disease progresses.

## 2015-10-25 NOTE — Assessment & Plan Note (Signed)
Steady weight gain over the years. Eats a terrible diet with daily fast food consumption. Discussed the importance of a healthy diet and regular exercise in order for weight loss and to reduce risk of other medical diseases.

## 2015-10-25 NOTE — Progress Notes (Signed)
Pre visit review using our clinic review tool, if applicable. No additional management support is needed unless otherwise documented below in the visit note. 

## 2015-10-25 NOTE — Assessment & Plan Note (Signed)
Stable. Currently taking Aloe as she's heard it can help.

## 2015-10-25 NOTE — Patient Instructions (Signed)
Start Flomax capsules to help with urinary flow and release of possible kidney stone.  It is important that you improve your diet. Please limit carbohydrates in the form of white bread, rice, pasta, cakes, cookies, sugary drinks, fast food, etc. Increase your consumption of fresh fruits and vegetables.  You need to consume about 2 liters of water daily.   It was a pleasure to meet you today! Please don't hesitate to call me with any questions.

## 2015-10-25 NOTE — Assessment & Plan Note (Signed)
Long standing history. Currently managed on Temazepam, tizanidine, hydrozyzine through psych.

## 2015-10-25 NOTE — Assessment & Plan Note (Signed)
History of quite elevated levels. Recent lipid panel above goal without improvement from prior year. Trigs are nearly doubled. She declines treatment with statins or any other medication. Cannot tolerate statin, offered for her to take every other day. Declines other medications as she cannot afford. Discussed the severity of her cholesterol, so she is aware of the risks of heart attack and stroke. Currently on aspirin 81 mg and Fish Oil. Increase Fish Oil to 3000 mg daily. Improve diet, start exercising. Will closely monitor.

## 2015-10-25 NOTE — Assessment & Plan Note (Signed)
Declines tdap and treatment for hyperlipidemia.  Pap, mammogram, pneumonia UTD. Will think about Zostavax. Discussed the importance of a healthy diet and regular exercise in order for weight loss and to reduce risk of other medical diseases. Does not wish to be treated for hyperlipidemia with RX meds. Discussed consequences.  I have personally reviewed and have noted: 1. The patient's medical and social history 2. Their use of alcohol, tobacco or illicit drugs 3. Their current medications and supplements 4. The patient's functional ability including ADL's, fall risks, home safety risks and hearing or visual impairment. 5. Diet and physical activities 6. Evidence for depression or mood disorder  Follow up in 1 year for repeat MWV.

## 2015-10-25 NOTE — Progress Notes (Signed)
Subjective:    Patient ID: Emily Moon, female    DOB: 10/21/1961, 54 y.o.   MRN: [id number]  HPI  Emily Moon is a 54 year old female who presents for transition of care from another provider. This is her first visit with me.  1) Multiple Sclerosis: Diagnosed in 1991. She is currently not on any medication. She is not managed by a neurologist as she does not wish to take medications and go through imaging. She last visited with a neurologist in 2012. Declines offer for referral.  She's noticed increased weakness, clumsiness, decreased sensation through her face, decreased ability with speech, decrease in handwriting ability, losing her balance occasionally. Her sister has to balance her checkbook and pay bills. She's starting to experience the "MS Hug" with her internal organs feeling as though they are closing in on her abdomen.  2) Insomnia: Diagnosed years ago. Currently managed on Temazepam 30 mg, tizanidine, and hydroxyzine at bedtime.   3) Bipolar Disorder: Currently following with Dr. Casimiro Needle with psychiatry. Currently managed on Cymbalta 20 mg, Hydroxyzine 50 mg, and Lamictal 150 mg. She describes her symptoms to be more depression with mild mania overall.   4) Osteoarthritis: Long standing history. Left hip replacement in 2013, right foot operation in 2015. She takes calcium and vitamin D.  Currently managed on Norco 10/325 mg per Dr. Lorelei Pont. No recent bone density testing as she does not want to cover the cost.   5) Obesity: Steady weight gain over the past several months. She believes it to be due to postmenopausal and some constipation. She will small bowel movements every other day. Doesn't feel as though she has a complete bowel movement.  She endorses a poor diet which consists of: Breakfast: Skips Lunch: McD[employer] nearly everyday, fast food, yogurt, quinoa, rice Dinner: Soups, [employer], yogurt, melba toast, cereal Desserts: Chocolate, doughnuts, cookies,  cereal Beverages: Water, occasional diet soda.  6) Hyperlipidemia: History of for years. Currently taking Fish Oil and aspirin 81 mg. Intolerant to statin medications and refuses treatment as she cannot afford. Lipid panel historically elevated, curent lipid panel above goal.  7) Flank pain: Located to the right side several weeks ago. She noticed relief several weeks ago, but has recently noticed increaseded pain. She does not have time today for review with UA and KUB. Pain is dull and tolerable. Denies hematuria.  Wt Readings from Last 3 Encounters:  10/25/15 222 lb 12.8 oz (101.061 kg)  09/09/15 218 lb 8 oz (99.111 kg)  07/15/15 217 lb (98.431 kg)     Review of Systems  Constitutional: Negative for unexpected weight change.  HENT: Negative for rhinorrhea.   Respiratory: Negative for cough and shortness of breath.   Cardiovascular: Negative for chest pain.  Gastrointestinal: Positive for constipation.  Genitourinary: Negative for difficulty urinating.       Takes AZO daily  Musculoskeletal: Positive for arthralgias.       Chronic arthritis  Skin: Negative for rash.       Saw dermatologist for rash to neck. History of psoriasis, uses cream.  Allergic/Immunologic: Positive for environmental allergies.  Neurological: Positive for numbness. Negative for headaches.       Occasional dizziness  Psychiatric/Behavioral:       See HPI       Past Medical History  Diagnosis Date  . Bipolar disorder ([place]) 05/21/14  . Multiple sclerosis ([place])   . Depression   . GERD (gastroesophageal reflux disease)   . Nephrolithiasis   .  Osteoporosis     osteoarthritis  . Fibromyalgia syndrome   . 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 Sleep apnea   . Arthritis   . PONV (postoperative nausea and vomiting)     no problem after cataract surgery  . Heart palpitations   . Edema     feet/legs  . Renal stone   . Urinary frequency   . Cataracts, bilateral   . Psoriasis   . Fungal infection      Finger nails    Social History   Social History  . Marital Status: Divorced    Spouse Name: N/A  . Number of Children: 1  . Years of Education: N/A   Occupational History  . 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 Smoking status: Former Smoker -- 0.50 packs/day for 29 years    Types: Cigarettes  . Smokeless tobacco: Never Used     Comment: recenty 04/26/2014  . Alcohol Use: No  . Drug Use: No  . Sexual Activity: No   Other Topics Concern  . Not on file   Social History Narrative    Past Surgical History  Procedure Laterality Date  . Tonsillectomy    . Joint replacement Left 2013    hip replacement  . Lithotripsy    . Cataract extraction w/phaco Left 05/21/2015    Procedure: CATARACT EXTRACTION PHACO AND INTRAOCULAR LENS PLACEMENT (IOC);  Surgeon: Birder Robson, MD;  Location: [facility] ORS;  Service: Ophthalmology;  Laterality: Left;  [place] 00:35 AP% 22.9 CDE 8.11 fluid pack lot [id number]  . Foot surgery    . Thumb arthroscopy    . Eye surgery      tissue biopsy  . Cataract extraction w/phaco Right 06/04/2015    Procedure: CATARACT EXTRACTION PHACO AND INTRAOCULAR LENS PLACEMENT (IOC);  Surgeon: Birder Robson, MD;  Location: [facility] ORS;  Service: Ophthalmology;  Laterality: Right;  US:00:48 AP%: 10.5 CDE:5.08 Fluid lot [id number]    Family History  Problem Relation Age of Onset  . Cancer Father     Allergies  Allergen Reactions  . Tylenol [Acetaminophen] Shortness Of Breath and Swelling  . Ultram [Tramadol Hcl]   . Aripiprazole   . Cefaclor   . Diclofenac Sodium   . Ibuprofen   . Naproxen Sodium   . Sulfa Antibiotics   . Voltaren [Diclofenac]     Current Outpatient Prescriptions on File Prior to Visit  Medication Sig Dispense Refill  . ASPERCREME LIDOCAINE EX Apply topically.    Marland Kitchen aspirin 325 MG tablet Take 325 mg by mouth as needed.    Marland Kitchen BIOTIN PO Take 5,000 mg by mouth daily.     . calcipotriene-betamethasone  (TACLONEX) ointment Apply topically daily. 60 g 0  . calcium carbonate (TUMS - DOSED IN MG ELEMENTAL CALCIUM) 500 MG chewable tablet Chew 2 tablets by mouth daily.     . cetirizine (ZYRTEC) 10 MG tablet Take 10 mg by mouth daily.    . Cholecalciferol (VITAMIN D-3 PO) Take 10,000 Units by mouth daily.     . ciclopirox (PENLAC) 8 % solution Apply nightly over nail and surrounding skin. Apply daily over prior coat. After 7 days, may remove with alcohol and continue 6.6 mL 3  . clobetasol cream (TEMOVATE) [id number] % Apply 1 application topically 2 (two) times daily. 30 g 2  . diclofenac sodium (VOLTAREN) 1 % GEL Apply 4 g topically 4 (four) times daily. 500 g 5  . DULoxetine (CYMBALTA) 20 MG  capsule Take 40 mg by mouth daily.  4  . fluticasone (FLONASE) 50 MCG/ACT nasal spray Place 1 spray into both nostrils daily.    . Garcinia Cambogia-Chromium 500-200 MG-MCG TABS Take 1 tablet by mouth 2 (two) times daily.    Marland Kitchen HYDROcodone-acetaminophen (NORCO) 10-325 MG tablet Take 1 tablet by mouth every 6 (six) hours as needed. 90 tablet 0  . hydroquinone 4 % cream APPLY TO AFFECTED AREAS ON FACE EVERY DAY AT BEDTIME  3  . hydrOXYzine (VISTARIL) 25 MG capsule Take 50 mg by mouth at bedtime.     . lamoTRIgine (LAMICTAL) 150 MG tablet Take two tablets by mouth daily 180 tablet 3  . levalbuterol (XOPENEX HFA) 45 MCG/ACT inhaler Inhale 1-2 puffs into the lungs every 4 (four) hours as needed. 1 Inhaler 0  . Lysine 1000 MG TABS Take 2,000 mg by mouth daily.     . nicotine (NICODERM CQ - DOSED IN MG/24 HOURS) 21 mg/24hr patch Place 21 mg onto the skin daily.    . Omega-3 Fatty Acids (FISH OIL PO) Take 2 capsules by mouth at bedtime.    Marland Kitchen Phenazopyridine HCl (AZO TABS PO) Take 2 tablets by mouth as needed.     . polyethylene glycol powder (GLYCOLAX/MIRALAX) powder MIX 17 GRAMS (1 CAPFUL) WITH 4-8 OZ OF LIQUID AND TAKE BY MOUTH TWICE DAILY AS NEEDED 527 g 0  . Probiotic Product (PROBIOTIC PO) Take 1 tablet by mouth daily.     . ranitidine (ZANTAC) 150 MG tablet Take 150 mg by mouth 2 (two) times daily as needed for heartburn.    . temazepam (RESTORIL) 30 MG capsule TAKE ONE TO TWO CAPSULES BY MOUTH NIGHTLY AT BEDTIME AS NEEDED  60 capsule 0  . tiZANidine (ZANAFLEX) 4 MG tablet Take 1 tablet (4 mg total) by mouth every 6 (six) hours as needed. 60 tablet 5   No current facility-administered medications on file prior to visit.    BP 116/82 mmHg  Pulse 88  Temp(Src) 98.5 F (36.9 C) (Oral)  Ht 5\' 9"  (1.753 m)  Wt 222 lb 12.8 oz (101.061 kg)  BMI 32.89 kg/m2  SpO2 95%  LMP 08/23/2014    Objective:   Physical Exam  Constitutional: She is oriented to person, place, and time. She appears well-nourished.  HENT:  Mouth/Throat: Oropharynx is clear and moist.  Eyes: EOM are normal. Pupils are equal, round, and reactive to light.  Neck: Neck supple.  Cardiovascular: Normal rate and regular rhythm.   Pulmonary/Chest: Effort normal and breath sounds normal.  Abdominal: Soft. Normal appearance and bowel sounds are normal. There is no tenderness. There is CVA tenderness.  Musculoskeletal:  Decreased strength with flexion of lower extremities.  Neurological: She is alert and oriented to person, place, and time. No cranial nerve deficit.  Reflex Scores:      Patellar reflexes are 3+ on the right side and 3+ on the left side. Grips equal bilaterally. Slight decrease in strength.  Skin: Skin is warm and dry.  Psychiatric: She has a normal mood and affect.          Assessment & Plan:

## 2015-10-25 NOTE — Assessment & Plan Note (Signed)
Flank pain several weeks ago, had complete relief last week. Now with right flank pain, dull. Declines evaluation today with KUB and UA as she does not have time. RX for Flomax provided to assist removal for possible stone.

## 2015-10-25 NOTE — Assessment & Plan Note (Signed)
Chronic. No recent bone density testing. Declines today. Sees Dr. Lorelei Pont for hip dysplasia. Currently taking Norco as needed.

## 2015-10-25 NOTE — Assessment & Plan Note (Signed)
Manages with Dr. Casimiro Needle with psych. Continue current regimen.

## 2015-10-31 ENCOUNTER — Other Ambulatory Visit: Payer: Self-pay

## 2015-10-31 NOTE — Telephone Encounter (Signed)
Pt left v/m requesting rx hydrocodone apap. Call when ready for pick up. Last printed # 90 on 10/07/15. Last annual exam on 10/25/15. Pt wants to pick up rx on 11/04/15.

## 2015-11-01 ENCOUNTER — Encounter: Payer: Self-pay | Admitting: Internal Medicine

## 2015-11-01 ENCOUNTER — Telehealth: Payer: Self-pay

## 2015-11-01 ENCOUNTER — Ambulatory Visit (INDEPENDENT_AMBULATORY_CARE_PROVIDER_SITE_OTHER): Payer: Medicare Other | Admitting: Internal Medicine

## 2015-11-01 VITALS — BP 124/76 | HR 101 | Temp 98.6°F | Wt 224.0 lb

## 2015-11-01 DIAGNOSIS — J069 Acute upper respiratory infection, unspecified: Secondary | ICD-10-CM | POA: Diagnosis not present

## 2015-11-01 MED ORDER — HYDROCODONE-ACETAMINOPHEN 10-325 MG PO TABS: 1.0000 | ORAL_TABLET | Freq: Four times a day (QID) | ORAL | Status: DC | PRN

## 2015-11-01 MED ORDER — LEVALBUTEROL TARTRATE 45 MCG/ACT IN AERO: 1.0000 | INHALATION_SPRAY | RESPIRATORY_TRACT | Status: DC | PRN

## 2015-11-01 MED ORDER — ALBUTEROL SULFATE HFA 108 (90 BASE) MCG/ACT IN AERS: 1.0000 | INHALATION_SPRAY | Freq: Four times a day (QID) | RESPIRATORY_TRACT | Status: DC | PRN

## 2015-11-01 NOTE — Progress Notes (Signed)
HPI  Pt presents to the clinic today with c/o nasal congestion, cough, fever, chills and body aches. This started 5 days ago. She is not blowing anything out of her nose. The cough is productive of yellow mucous. She is mildly short of breath. She has run low grade fevers < 100. She has tried Tylenol and Ibuprofen without relief. She does have seasonal allergies but takes Zyrtec and Flonase daily. She has not had sick contacts that she is aware of. She does smoke. Her flu shot is UTD. She is currently on Amoxil for a dental infection.  Review of Systems      Past Medical History  Diagnosis Date  . Bipolar disorder ([place]) 05/21/14  . Multiple sclerosis ([place])   . Depression   . GERD (gastroesophageal reflux disease)   . Nephrolithiasis   . Osteoporosis     osteoarthritis  . Fibromyalgia syndrome   . 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 Sleep apnea   . Arthritis   . PONV (postoperative nausea and vomiting)     no problem after cataract surgery  . Heart palpitations   . Edema     feet/legs  . Renal stone   . Urinary frequency   . Cataracts, bilateral   . Psoriasis   . Fungal infection     Finger nails    Family History  Problem Relation Age of Onset  . Cancer Father     Social History   Social History  . Marital Status: Divorced    Spouse Name: N/A  . Number of Children: 1  . Years of Education: N/A   Occupational History  . 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 Smoking status: Former Smoker -- 0.50 packs/day for 29 years    Types: Cigarettes  . Smokeless tobacco: Never Used     Comment: recenty 04/26/2014  . Alcohol Use: No  . Drug Use: No  . Sexual Activity: No   Other Topics Concern  . Not on file   Social History Narrative    Allergies  Allergen Reactions  . Tylenol [Acetaminophen] Shortness Of Breath and Swelling  . Ultram [Tramadol Hcl]   . Aripiprazole   . Cefaclor   . Diclofenac Sodium   . Ibuprofen   .  Naproxen Sodium   . Sulfa Antibiotics   . Voltaren [Diclofenac]      Constitutional: Positive headache, fatigue and fever. Denies abrupt weight changes.  HEENT:  Positive nasal congestion, sore throat. Denies eye redness, eye pain, pressure behind the eyes, facial pain, ear pain, ringing in the ears, wax buildup, runny nose or bloody nose. Respiratory: Positive cough and shortness of breath. Denies difficulty breathing.  Cardiovascular: Denies chest pain, chest tightness, palpitations or swelling in the hands or feet.   No other specific complaints in a complete review of systems (except as listed in HPI above).  Objective:   BP 124/76 mmHg  Pulse 101  Temp(Src) 98.6 F (37 C) (Oral)  Wt 224 lb (101.606 kg)  SpO2 98%  LMP 08/23/2014 Wt Readings from Last 3 Encounters:  11/01/15 224 lb (101.606 kg)  10/25/15 222 lb 12.8 oz (101.061 kg)  09/09/15 218 lb 8 oz (99.111 kg)     General: Appears her stated age,  in NAD. HEENT: Head: normal shape and size, no sinus tenderness noted; Eyes: sclera white, no icterus, conjunctiva pink; Ears: Tm's pink but  intact, normal light reflex; Nose: mucosa pink and moist, septum  midline; Throat/Mouth: Teeth present, mucosa pink and moist, no exudate noted, no lesions or ulcerations noted.  Neck: No cervical lymphadenopathy.  Cardiovascular: Normal rate and rhythm. S1,S2 noted.  No murmur, rubs or gallops noted.  Pulmonary/Chest: Normal effort and positive vesicular breath sounds. No respiratory distress. No wheezes, rales or ronchi noted.      Assessment & Plan:   Upper Respiratory Infection:  Likely viral but she is on Amoxil which should cover any bacterial component Get some rest and drink plenty of water Do salt water gargles for the sore throat Continue Ibuprofen for fever and body aches Delsym as needed for cough Continue Zyrtec and Flonase  RTC as needed or if symptoms persist.

## 2015-11-01 NOTE — Progress Notes (Signed)
Pre visit review using our clinic review tool, if applicable. No additional management support is needed unless otherwise documented below in the visit note. 

## 2015-11-01 NOTE — Telephone Encounter (Signed)
[facility] [place] left v/m; Xopenex requires prior auth; call [phone] for PA or proair inhaler can be substituted.Please advise.

## 2015-11-01 NOTE — Telephone Encounter (Signed)
Rx is ready for pick up. Left in front office. Left message for patient that Rx is ready.

## 2015-11-01 NOTE — Patient Instructions (Signed)

## 2015-11-01 NOTE — Telephone Encounter (Signed)
Please send in Proair

## 2015-11-01 NOTE — Telephone Encounter (Signed)
Rx sent through [facility]  

## 2015-11-01 NOTE — Addendum Note (Signed)
Addended by: Lurlean Nanny on: 11/01/2015 04:51 PM   Modules accepted: Orders, Medications

## 2015-11-02 ENCOUNTER — Other Ambulatory Visit: Payer: Self-pay | Admitting: Family Medicine

## 2015-11-03 NOTE — Telephone Encounter (Signed)
Last office visit 11/01/2015.  Last refilled 07/15/2015 for #60 with 5 refills.  Ok to refill?

## 2015-11-06 ENCOUNTER — Ambulatory Visit: Payer: Self-pay | Admitting: Family Medicine

## 2015-11-07 ENCOUNTER — Ambulatory Visit (INDEPENDENT_AMBULATORY_CARE_PROVIDER_SITE_OTHER): Payer: Medicare Other | Admitting: Primary Care

## 2015-11-07 ENCOUNTER — Ambulatory Visit: Payer: Self-pay | Admitting: Primary Care

## 2015-11-07 VITALS — BP 118/76 | HR 96 | Temp 99.0°F | Wt 224.8 lb

## 2015-11-07 DIAGNOSIS — J329 Chronic sinusitis, unspecified: Secondary | ICD-10-CM | POA: Diagnosis not present

## 2015-11-07 MED ORDER — AMOXICILLIN-POT CLAVULANATE 875-125 MG PO TABS: 1.0000 | ORAL_TABLET | Freq: Two times a day (BID) | ORAL | Status: DC

## 2015-11-07 NOTE — Progress Notes (Signed)
Subjective:    Patient ID: Emily Moon, female    DOB: 10-Apr-1961, 54 y.o.   MRN: [id number]  HPI  Emily Moon is a 54 year old female who presents today with a chief complaint of nasal congestion. She also reports sinus pressure, cough, sore throat. She was evaluated on 11/01/15 for same symptoms. She was currently on amoxicilin for a dental infection during that appointment and was encouraged to complete course as well as provided with supportive measures. She completed her Amoxicillin Tuesday this week.  Since her last visit her symptoms have not improved. She's begun to feel increased pressure behind both eyes. She has a low grade fevers at home and today. She's tried OTC treatment without improvement. She's blowing green mucous from her nose.  Review of Systems  Constitutional: Positive for fever and chills.  HENT: Positive for congestion, sinus pressure and sore throat. Negative for ear pain.   Respiratory: Positive for cough.   Musculoskeletal: Positive for myalgias.       Past Medical History  Diagnosis Date  . Bipolar disorder ([place]) 05/21/14  . Multiple sclerosis ([place])   . Depression   . GERD (gastroesophageal reflux disease)   . Nephrolithiasis   . Osteoporosis     osteoarthritis  . Fibromyalgia syndrome   . 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 Sleep apnea   . Arthritis   . PONV (postoperative nausea and vomiting)     no problem after cataract surgery  . Heart palpitations   . Edema     feet/legs  . Renal stone   . Urinary frequency   . Cataracts, bilateral   . Psoriasis   . Fungal infection     Finger nails    Social History   Social History  . Marital Status: Divorced    Spouse Name: N/A  . Number of Children: 1  . Years of Education: N/A   Occupational History  . 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 Smoking status: Former Smoker -- 0.50 packs/day for 29 years    Types: Cigarettes  . Smokeless  tobacco: Never Used     Comment: recenty 04/26/2014  . Alcohol Use: No  . Drug Use: No  . Sexual Activity: No   Other Topics Concern  . Not on file   Social History Narrative    Past Surgical History  Procedure Laterality Date  . Tonsillectomy    . Joint replacement Left 2013    hip replacement  . Lithotripsy    . Cataract extraction w/phaco Left 05/21/2015    Procedure: CATARACT EXTRACTION PHACO AND INTRAOCULAR LENS PLACEMENT (IOC);  Surgeon: Birder Robson, MD;  Location: [facility] ORS;  Service: Ophthalmology;  Laterality: Left;  [place] 00:35 AP% 22.9 CDE 8.11 fluid pack lot [id number]  . Foot surgery    . Thumb arthroscopy    . Eye surgery      tissue biopsy  . Cataract extraction w/phaco Right 06/04/2015    Procedure: CATARACT EXTRACTION PHACO AND INTRAOCULAR LENS PLACEMENT (IOC);  Surgeon: Birder Robson, MD;  Location: [facility] ORS;  Service: Ophthalmology;  Laterality: Right;  US:00:48 AP%: 10.5 CDE:5.08 Fluid lot [id number]    Family History  Problem Relation Age of Onset  . Cancer Father     Allergies  Allergen Reactions  . Tylenol [Acetaminophen] Shortness Of Breath and Swelling  . Ultram [Tramadol Hcl]   . Aripiprazole   . Cefaclor   . Diclofenac Sodium   .  Ibuprofen   . Naproxen Sodium   . Sulfa Antibiotics   . Voltaren [Diclofenac]     Current Outpatient Prescriptions on File Prior to Visit  Medication Sig Dispense Refill  . albuterol (PROVENTIL HFA;VENTOLIN HFA) 108 (90 BASE) MCG/ACT inhaler Inhale 1-2 puffs into the lungs every 6 (six) hours as needed for wheezing or shortness of breath. 1 Inhaler 1  . ASPERCREME LIDOCAINE EX Apply topically.    Marland Kitchen aspirin 325 MG tablet Take 325 mg by mouth as needed.    Marland Kitchen BIOTIN PO Take 5,000 mg by mouth daily.     . calcipotriene-betamethasone (TACLONEX) ointment Apply topically daily. 60 g 0  . calcium carbonate (TUMS - DOSED IN MG ELEMENTAL CALCIUM) 500 MG chewable tablet Chew 2 tablets by mouth daily.     . cetirizine  (ZYRTEC) 10 MG tablet Take 10 mg by mouth daily.    . Cholecalciferol (VITAMIN D-3 PO) Take 10,000 Units by mouth daily.     . ciclopirox (PENLAC) 8 % solution Apply nightly over nail and surrounding skin. Apply daily over prior coat. After 7 days, may remove with alcohol and continue 6.6 mL 3  . clobetasol cream (TEMOVATE) [id number] % Apply 1 application topically 2 (two) times daily. 30 g 2  . diclofenac sodium (VOLTAREN) 1 % GEL Apply 4 g topically 4 (four) times daily. 500 g 5  . DULoxetine (CYMBALTA) 20 MG capsule Take 40 mg by mouth daily.  4  . fluticasone (FLONASE) 50 MCG/ACT nasal spray Place 1 spray into both nostrils daily.    Marland Kitchen HYDROcodone-acetaminophen (NORCO) 10-325 MG tablet Take 1 tablet by mouth every 6 (six) hours as needed. 90 tablet 0  . hydroquinone 4 % cream APPLY TO AFFECTED AREAS ON FACE EVERY DAY AT BEDTIME  3  . hydrOXYzine (VISTARIL) 25 MG capsule Take 50 mg by mouth at bedtime.     . lamoTRIgine (LAMICTAL) 150 MG tablet Take two tablets by mouth daily 180 tablet 3  . Lysine 1000 MG TABS Take 2,000 mg by mouth daily.     . nicotine (NICODERM CQ - DOSED IN MG/24 HOURS) 21 mg/24hr patch Place 21 mg onto the skin daily.    . Omega-3 Fatty Acids (FISH OIL PO) Take 2 capsules by mouth at bedtime.    Marland Kitchen Phenazopyridine HCl (AZO TABS PO) Take 2 tablets by mouth as needed.     . polyethylene glycol powder (GLYCOLAX/MIRALAX) powder MIX 17 GRAMS (1 CAPFUL) WITH 4-8 OZ OF LIQUID AND TAKE BY MOUTH TWICE DAILY AS NEEDED 527 g 0  . Probiotic Product (PROBIOTIC PO) Take 1 tablet by mouth daily.    . ranitidine (ZANTAC) 150 MG tablet Take 150 mg by mouth 2 (two) times daily as needed for heartburn.    . tamsulosin (FLOMAX) 0.4 MG CAPS capsule Take 1 capsule (0.4 mg total) by mouth daily. 30 capsule 0  . temazepam (RESTORIL) 30 MG capsule TAKE ONE TO TWO CAPSULES BY MOUTH NIGHTLY AT BEDTIME AS NEEDED  60 capsule 0  . tiZANidine (ZANAFLEX) 4 MG tablet TAKE 1 TABLET (4 MG TOTAL) BY MOUTH EVERY  6 (SIX) HOURS AS NEEDED. 60 tablet 5  . Garcinia Cambogia-Chromium 500-200 MG-MCG TABS Take 1 tablet by mouth 2 (two) times daily. Reported on 11/07/2015     No current facility-administered medications on file prior to visit.    BP 118/76 mmHg  Pulse 96  Temp(Src) 99 F (37.2 C) (Oral)  Wt 224 lb 12.8 oz (101.969 kg)  SpO2 95%  LMP 08/23/2014    Objective:   Physical Exam  Constitutional: She appears well-nourished.  HENT:  Right Ear: Ear canal normal. Tympanic membrane is retracted. Tympanic membrane is not erythematous.  Left Ear: Ear canal normal. Tympanic membrane is bulging. Tympanic membrane is not erythematous.  Nose: Right sinus exhibits maxillary sinus tenderness and frontal sinus tenderness. Left sinus exhibits maxillary sinus tenderness and frontal sinus tenderness.  Mouth/Throat: Posterior oropharyngeal erythema present. No oropharyngeal exudate or posterior oropharyngeal edema.  Cardiovascular: Normal rate and regular rhythm.   Pulmonary/Chest: Effort normal and breath sounds normal. She has no wheezes. She has no rales.  Skin: Skin is warm and dry.          Assessment & Plan:  Acute bacterial sinusitis:  Symptoms present for 11 days. Increased sinus pressure and with green mucous from nasal cavity.  Exam with moderate tenderness to maxillary sinuses. Lungs clear. No improvement with amoxicillin and supportive treatment. RX for Augmentin course sent to pharmacy. Discussed other supportive measures to include as well. Fluids, rest, follow up PRN.

## 2015-11-07 NOTE — Patient Instructions (Signed)
Start Augmentin antibiotics. Take 1 tablet by mouth twice daily for 7 days.  Continue to stay hydrated and rest.  It was a pleasure to see you today!  Sinusitis, Adult Sinusitis is redness, soreness, and inflammation of the paranasal sinuses. Paranasal sinuses are air pockets within the bones of your face. They are located beneath your eyes, in the middle of your forehead, and above your eyes. In healthy paranasal sinuses, mucus is able to drain out, and air is able to circulate through them by way of your nose. However, when your paranasal sinuses are inflamed, mucus and air can become trapped. This can allow bacteria and other germs to grow and cause infection. Sinusitis can develop quickly and last only a short time (acute) or continue over a long period (chronic). Sinusitis that lasts for more than 12 weeks is considered chronic. CAUSES Causes of sinusitis include:  Allergies.  Structural abnormalities, such as displacement of the cartilage that separates your nostrils (deviated septum), which can decrease the air flow through your nose and sinuses and affect sinus drainage.  Functional abnormalities, such as when the small hairs (cilia) that line your sinuses and help remove mucus do not work properly or are not present. SIGNS AND SYMPTOMS Symptoms of acute and chronic sinusitis are the same. The primary symptoms are pain and pressure around the affected sinuses. Other symptoms include:  Upper toothache.  Earache.  Headache.  Bad breath.  Decreased sense of smell and taste.  A cough, which worsens when you are lying flat.  Fatigue.  Fever.  Thick drainage from your nose, which often is green and may contain pus (purulent).  Swelling and warmth over the affected sinuses. DIAGNOSIS Your health care provider will perform a physical exam. During your exam, your health care provider may perform any of the following to help determine if you have acute sinusitis or chronic  sinusitis:  Look in your nose for signs of abnormal growths in your nostrils (nasal polyps).  Tap over the affected sinus to check for signs of infection.  View the inside of your sinuses using an imaging device that has a light attached (endoscope). If your health care provider suspects that you have chronic sinusitis, one or more of the following tests may be recommended:  Allergy tests.  Nasal culture. A sample of mucus is taken from your nose, sent to a lab, and screened for bacteria.  Nasal cytology. A sample of mucus is taken from your nose and examined by your health care provider to determine if your sinusitis is related to an allergy. TREATMENT Most cases of acute sinusitis are related to a viral infection and will resolve on their own within 10 days. Sometimes, medicines are prescribed to help relieve symptoms of both acute and chronic sinusitis. These may include pain medicines, decongestants, nasal steroid sprays, or saline sprays. However, for sinusitis related to a bacterial infection, your health care provider will prescribe antibiotic medicines. These are medicines that will help kill the bacteria causing the infection. Rarely, sinusitis is caused by a fungal infection. In these cases, your health care provider will prescribe antifungal medicine. For some cases of chronic sinusitis, surgery is needed. Generally, these are cases in which sinusitis recurs more than 3 times per year, despite other treatments. HOME CARE INSTRUCTIONS  Drink plenty of water. Water helps thin the mucus so your sinuses can drain more easily.  Use a humidifier.  Inhale steam 3-4 times a day (for example, sit in the bathroom with the  shower running).  Apply a warm, moist washcloth to your face 3-4 times a day, or as directed by your health care provider.  Use saline nasal sprays to help moisten and clean your sinuses.  Take medicines only as directed by your health care provider.  If you were  prescribed either an antibiotic or antifungal medicine, finish it all even if you start to feel better. SEEK IMMEDIATE MEDICAL CARE IF:  You have increasing pain or severe headaches.  You have nausea, vomiting, or drowsiness.  You have swelling around your face.  You have vision problems.  You have a stiff neck.  You have difficulty breathing.   This information is not intended to replace advice given to you by your health care provider. Make sure you discuss any questions you have with your health care provider.   Document Released: 11/02/2005 Document Revised: 11/23/2014 Document Reviewed: 11/17/2011 Elsevier Interactive Patient Education [employer].

## 2015-11-07 NOTE — Progress Notes (Signed)
Pre visit review using our clinic review tool, if applicable. No additional management support is needed unless otherwise documented below in the visit note. 

## 2015-11-12 ENCOUNTER — Telehealth: Payer: Self-pay

## 2015-11-12 DIAGNOSIS — R05 Cough: Secondary | ICD-10-CM

## 2015-11-12 DIAGNOSIS — R059 Cough, unspecified: Secondary | ICD-10-CM

## 2015-11-12 MED ORDER — LEVALBUTEROL TARTRATE 45 MCG/ACT IN AERO: 1.0000 | INHALATION_SPRAY | Freq: Four times a day (QID) | RESPIRATORY_TRACT | Status: DC | PRN

## 2015-11-12 NOTE — Telephone Encounter (Signed)
Xopenex not on pt's formulary, she will have to pay out of pocket.

## 2015-11-12 NOTE — Telephone Encounter (Signed)
Emily Moon, pharmacist [facility]  stated that prior auth would be needed for Xopenex.  Best number for prior auth is [phone].

## 2015-11-12 NOTE — Telephone Encounter (Signed)
Ronalee Belts with [facility] [place] left v/m; pt requesting prescription for xopenex; the albuterol inhaler was causing pt to feel "hyped up". Xopenex has worked in the past and pt [facility]. Per hx med list xopenex is not covered by pts ins.Please advise.

## 2015-11-12 NOTE — Telephone Encounter (Signed)
[facility] It looks like a prior authorization may need to be completed. Please notify Emily Moon that this may take several days.

## 2015-11-22 ENCOUNTER — Ambulatory Visit (INDEPENDENT_AMBULATORY_CARE_PROVIDER_SITE_OTHER): Payer: PPO | Admitting: Primary Care

## 2015-11-22 VITALS — BP 116/72 | HR 89 | Temp 98.7°F | Ht 69.0 in | Wt 220.4 lb

## 2015-11-22 DIAGNOSIS — R319 Hematuria, unspecified: Secondary | ICD-10-CM

## 2015-11-22 DIAGNOSIS — R339 Retention of urine, unspecified: Secondary | ICD-10-CM | POA: Diagnosis not present

## 2015-11-22 DIAGNOSIS — M1990 Unspecified osteoarthritis, unspecified site: Secondary | ICD-10-CM | POA: Diagnosis not present

## 2015-11-22 DIAGNOSIS — R109 Unspecified abdominal pain: Secondary | ICD-10-CM

## 2015-11-22 DIAGNOSIS — N390 Urinary tract infection, site not specified: Secondary | ICD-10-CM

## 2015-11-22 LAB — POC URINALSYSI DIPSTICK (AUTOMATED)
Glucose, UA: NEGATIVE
Ketones, UA: NEGATIVE
NITRITE UA: POSITIVE
PH UA: 6
Spec Grav, UA: 1.02
Urobilinogen, UA: 1

## 2015-11-22 MED ORDER — HYDROCODONE-ACETAMINOPHEN 10-325 MG PO TABS: 1.0000 | ORAL_TABLET | Freq: Four times a day (QID) | ORAL | Status: DC | PRN

## 2015-11-22 MED ORDER — CIPROFLOXACIN HCL 250 MG PO TABS: 250.0000 mg | ORAL_TABLET | Freq: Two times a day (BID) | ORAL | Status: DC

## 2015-11-22 MED ORDER — TAMSULOSIN HCL 0.4 MG PO CAPS: 0.4000 mg | ORAL_CAPSULE | Freq: Every day | ORAL | Status: DC

## 2015-11-22 NOTE — Addendum Note (Signed)
Addended by: Jacqualin Combes on: 11/22/2015 01:23 PM   Modules accepted: Orders

## 2015-11-22 NOTE — Assessment & Plan Note (Signed)
History of difficulty emptying bladder for years. Trialed flomax last visit and is much improved.  Refill provided.  Once on mybectric for overactive bladder, last in July 2016 as she could not afford. Unsure if this and flomax would contradict one another. Will hold off on mybectric.

## 2015-11-22 NOTE — Patient Instructions (Signed)
Start Ciprofloxacin antibiotics. Take 1 tablet by mouth twice daily for 5 days for urinary tract infection.  Increase consumption of water to stay hydrated.  You may take AZO as needed for symptoms.  It was a pleasure to see you today!  Urinary Tract Infection Urinary tract infections (UTIs) can develop anywhere along your urinary tract. Your urinary tract is your body's drainage system for removing wastes and extra water. Your urinary tract includes two kidneys, two ureters, a bladder, and a urethra. Your kidneys are a pair of bean-shaped organs. Each kidney is about the size of your fist. They are located below your ribs, one on each side of your spine. CAUSES Infections are caused by microbes, which are microscopic organisms, including fungi, viruses, and bacteria. These organisms are so small that they can only be seen through a microscope. Bacteria are the microbes that most commonly cause UTIs. SYMPTOMS  Symptoms of UTIs may vary by age and gender of the patient and by the location of the infection. Symptoms in young women typically include a frequent and intense urge to urinate and a painful, burning feeling in the bladder or urethra during urination. Older women and men are more likely to be tired, shaky, and weak and have muscle aches and abdominal pain. A fever may mean the infection is in your kidneys. Other symptoms of a kidney infection include pain in your back or sides below the ribs, nausea, and vomiting. DIAGNOSIS To diagnose a UTI, your caregiver will ask you about your symptoms. Your caregiver will also ask you to provide a urine sample. The urine sample will be tested for bacteria and white blood cells. White blood cells are made by your body to help fight infection. TREATMENT  Typically, UTIs can be treated with medication. Because most UTIs are caused by a bacterial infection, they usually can be treated with the use of antibiotics. The choice of antibiotic and length of  treatment depend on your symptoms and the type of bacteria causing your infection. HOME CARE INSTRUCTIONS  If you were prescribed antibiotics, take them exactly as your caregiver instructs you. Finish the medication even if you feel better after you have only taken some of the medication.  Drink enough water and fluids to keep your urine clear or pale yellow.  Avoid caffeine, tea, and carbonated beverages. They tend to irritate your bladder.  Empty your bladder often. Avoid holding urine for long periods of time.  Empty your bladder before and after sexual intercourse.  After a bowel movement, women should cleanse from front to back. Use each tissue only once. SEEK MEDICAL CARE IF:   You have back pain.  You develop a fever.  Your symptoms do not begin to resolve within 3 days. SEEK IMMEDIATE MEDICAL CARE IF:   You have severe back pain or lower abdominal pain.  You develop chills.  You have nausea or vomiting.  You have continued burning or discomfort with urination. MAKE SURE YOU:   Understand these instructions.  Will watch your condition.  Will get help right away if you are not doing well or get worse.   This information is not intended to replace advice given to you by your health care provider. Make sure you discuss any questions you have with your health care provider.   Document Released: 08/12/2005 Document Revised: 07/24/2015 Document Reviewed: 12/11/2011 Elsevier Interactive Patient Education [employer].

## 2015-11-22 NOTE — Progress Notes (Signed)
Subjective:    Patient ID: Emily Moon, female    DOB: 1961/09/28, 55 y.o.   MRN: [id number]  HPI  Emily Moon is a 55 year old female who presents today with a chief complaint of vaginal  burning. She also reports dysuria, frequency and mild vaginal discharge. She's been cleansing with summer's eve mild soap, use clobetasol cream clobatesol, and AZO. The AZO has worked to reduce symptoms. She was once managed on Myrbectric for overactive bladder in the past. Her last prescription was in July 2016 as she could not afford. Denies hematuria, fevers, flank pain.  Review of Systems  Constitutional: Negative for fever and chills.  Gastrointestinal: Negative for abdominal pain.  Genitourinary: Positive for dysuria, frequency and vaginal discharge. Negative for hematuria, flank pain and difficulty urinating.       Past Medical History  Diagnosis Date  . Bipolar disorder ([place]) 05/21/14  . Multiple sclerosis ([place])   . Depression   . GERD (gastroesophageal reflux disease)   . Nephrolithiasis   . Osteoporosis     osteoarthritis  . Fibromyalgia syndrome   . 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 Sleep apnea   . Arthritis   . PONV (postoperative nausea and vomiting)     no problem after cataract surgery  . Heart palpitations   . Edema     feet/legs  . Renal stone   . Urinary frequency   . Cataracts, bilateral   . Psoriasis   . Fungal infection     Finger nails    Social History   Social History  . Marital Status: Divorced    Spouse Name: N/A  . Number of Children: 1  . Years of Education: N/A   Occupational History  . 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 Smoking status: Former Smoker -- 0.50 packs/day for 29 years    Types: Cigarettes  . Smokeless tobacco: Never Used     Comment: recenty 04/26/2014  . Alcohol Use: No  . Drug Use: No  . Sexual Activity: No   Other Topics Concern  . Not on file   Social History Narrative     Past Surgical History  Procedure Laterality Date  . Tonsillectomy    . Joint replacement Left 2013    hip replacement  . Lithotripsy    . Cataract extraction w/phaco Left 05/21/2015    Procedure: CATARACT EXTRACTION PHACO AND INTRAOCULAR LENS PLACEMENT (IOC);  Surgeon: Birder Robson, MD;  Location: [facility] ORS;  Service: Ophthalmology;  Laterality: Left;  [place] 00:35 AP% 22.9 CDE 8.11 fluid pack lot [id number]  . Foot surgery    . Thumb arthroscopy    . Eye surgery      tissue biopsy  . Cataract extraction w/phaco Right 06/04/2015    Procedure: CATARACT EXTRACTION PHACO AND INTRAOCULAR LENS PLACEMENT (IOC);  Surgeon: Birder Robson, MD;  Location: [facility] ORS;  Service: Ophthalmology;  Laterality: Right;  US:00:48 AP%: 10.5 CDE:5.08 Fluid lot [id number]    Family History  Problem Relation Age of Onset  . Cancer Father     Allergies  Allergen Reactions  . Tylenol [Acetaminophen] Shortness Of Breath and Swelling  . Ultram [Tramadol Hcl]   . Albuterol Other (See Comments)    Makes pt feel hyped up.  . Aripiprazole   . Cefaclor   . Diclofenac Sodium   . Ibuprofen   . Naproxen Sodium   . Sulfa Antibiotics   . Voltaren [Diclofenac]  Current Outpatient Prescriptions on File Prior to Visit  Medication Sig Dispense Refill  . ASPERCREME LIDOCAINE EX Apply topically.    Marland Kitchen aspirin 325 MG tablet Take 325 mg by mouth as needed.    Marland Kitchen BIOTIN PO Take 5,000 mg by mouth daily.     . calcipotriene-betamethasone (TACLONEX) ointment Apply topically daily. 60 g 0  . calcium carbonate (TUMS - DOSED IN MG ELEMENTAL CALCIUM) 500 MG chewable tablet Chew 2 tablets by mouth daily.     . cetirizine (ZYRTEC) 10 MG tablet Take 10 mg by mouth daily.    . Cholecalciferol (VITAMIN D-3 PO) Take 10,000 Units by mouth daily.     . ciclopirox (PENLAC) 8 % solution Apply nightly over nail and surrounding skin. Apply daily over prior coat. After 7 days, may remove with alcohol and continue 6.6 mL 3  .  clobetasol cream (TEMOVATE) [id number] % Apply 1 application topically 2 (two) times daily. 30 g 2  . diclofenac sodium (VOLTAREN) 1 % GEL Apply 4 g topically 4 (four) times daily. 500 g 5  . DULoxetine (CYMBALTA) 20 MG capsule Take 40 mg by mouth daily.  4  . fluticasone (FLONASE) 50 MCG/ACT nasal spray Place 1 spray into both nostrils daily.    . Garcinia Cambogia-Chromium 500-200 MG-MCG TABS Take 1 tablet by mouth 2 (two) times daily. Reported on 11/07/2015    . hydroquinone 4 % cream APPLY TO AFFECTED AREAS ON FACE EVERY DAY AT BEDTIME  3  . lamoTRIgine (LAMICTAL) 150 MG tablet Take two tablets by mouth daily 180 tablet 3  . levalbuterol (XOPENEX HFA) 45 MCG/ACT inhaler Inhale 1-2 puffs into the lungs every 6 (six) hours as needed for wheezing. 1 Inhaler 5  . Lysine 1000 MG TABS Take 2,000 mg by mouth daily.     . nicotine (NICODERM CQ - DOSED IN MG/24 HOURS) 21 mg/24hr patch Place 21 mg onto the skin daily.    . Omega-3 Fatty Acids (FISH OIL PO) Take 2 capsules by mouth at bedtime.    Marland Kitchen Phenazopyridine HCl (AZO TABS PO) Take 2 tablets by mouth as needed.     . polyethylene glycol powder (GLYCOLAX/MIRALAX) powder MIX 17 GRAMS (1 CAPFUL) WITH 4-8 OZ OF LIQUID AND TAKE BY MOUTH TWICE DAILY AS NEEDED 527 g 0  . Probiotic Product (PROBIOTIC PO) Take 1 tablet by mouth daily.    . ranitidine (ZANTAC) 150 MG tablet Take 150 mg by mouth 2 (two) times daily as needed for heartburn.    . temazepam (RESTORIL) 30 MG capsule TAKE ONE TO TWO CAPSULES BY MOUTH NIGHTLY AT BEDTIME AS NEEDED  60 capsule 0  . tiZANidine (ZANAFLEX) 4 MG tablet TAKE 1 TABLET (4 MG TOTAL) BY MOUTH EVERY 6 (SIX) HOURS AS NEEDED. 60 tablet 5   No current facility-administered medications on file prior to visit.    BP 116/72 mmHg  Pulse 89  Temp(Src) 98.7 F (37.1 C) (Oral)  Ht 5\' 9"  (1.753 m)  Wt 220 lb 6.4 oz (99.973 kg)  BMI 32.53 kg/m2  SpO2 97%  LMP 08/23/2014    Objective:   Physical Exam  Constitutional: She appears  well-nourished.  Cardiovascular: Normal rate and regular rhythm.   Pulmonary/Chest: Effort normal and breath sounds normal.  Abdominal: There is no CVA tenderness.  Genitourinary: Vagina normal. Cervix exhibits no motion tenderness and no discharge.  Skin: Skin is warm and dry.          Assessment & Plan:  Urinary Tract  Infection:  Dysuria, vaginal burning, frequency x 3 days. Some improvement with AZO. Pelvic exam unremarkable. UA: Positive for leuks, nitrites, blood. Culture sent. Treat with cipro course, continue AZO, fluids. Follow up PRN.  Wet prep completed, however patient does not want this sent off for testing as she doesn't want to incur a charge. She would like for [place] to cancel.

## 2015-11-24 LAB — URINE CULTURE
Colony Count: NO GROWTH
Organism ID, Bacteria: NO GROWTH

## 2015-11-27 ENCOUNTER — Ambulatory Visit (INDEPENDENT_AMBULATORY_CARE_PROVIDER_SITE_OTHER)
Admission: RE | Admit: 2015-11-27 | Discharge: 2015-11-27 | Disposition: A | Discharge status: Home / Self Care | Payer: PPO | Source: Ambulatory Visit | Attending: Family Medicine | Admitting: Family Medicine

## 2015-11-27 ENCOUNTER — Ambulatory Visit (INDEPENDENT_AMBULATORY_CARE_PROVIDER_SITE_OTHER): Payer: PPO | Admitting: Family Medicine

## 2015-11-27 ENCOUNTER — Telehealth: Payer: Self-pay | Admitting: Primary Care

## 2015-11-27 ENCOUNTER — Encounter: Payer: Self-pay | Admitting: Family Medicine

## 2015-11-27 VITALS — BP 114/72 | HR 89 | Temp 99.3°F | Ht 69.0 in | Wt 227.0 lb

## 2015-11-27 DIAGNOSIS — M25511 Pain in right shoulder: Secondary | ICD-10-CM | POA: Diagnosis not present

## 2015-11-27 DIAGNOSIS — M7061 Trochanteric bursitis, right hip: Secondary | ICD-10-CM

## 2015-11-27 DIAGNOSIS — M7540 Impingement syndrome of unspecified shoulder: Secondary | ICD-10-CM

## 2015-11-27 DIAGNOSIS — Z471 Aftercare following joint replacement surgery: Secondary | ICD-10-CM | POA: Diagnosis not present

## 2015-11-27 DIAGNOSIS — H02409 Unspecified ptosis of unspecified eyelid: Secondary | ICD-10-CM | POA: Diagnosis not present

## 2015-11-27 DIAGNOSIS — M25552 Pain in left hip: Secondary | ICD-10-CM

## 2015-11-27 DIAGNOSIS — Z96642 Presence of left artificial hip joint: Secondary | ICD-10-CM | POA: Diagnosis not present

## 2015-11-27 DIAGNOSIS — J0101 Acute recurrent maxillary sinusitis: Secondary | ICD-10-CM | POA: Diagnosis not present

## 2015-11-27 IMAGING — CR DG HIP (WITH OR WITHOUT PELVIS) 2-3V*L*
5 series · 5 of 5 positions shown · non-contrast
Comparison: None

CLINICAL DATA: LEFT hip pain, injury on snow

EXAM:
DG HIP (WITH OR WITHOUT PELVIS) 2-3V LEFT

[view not recorded (1 of 5)]
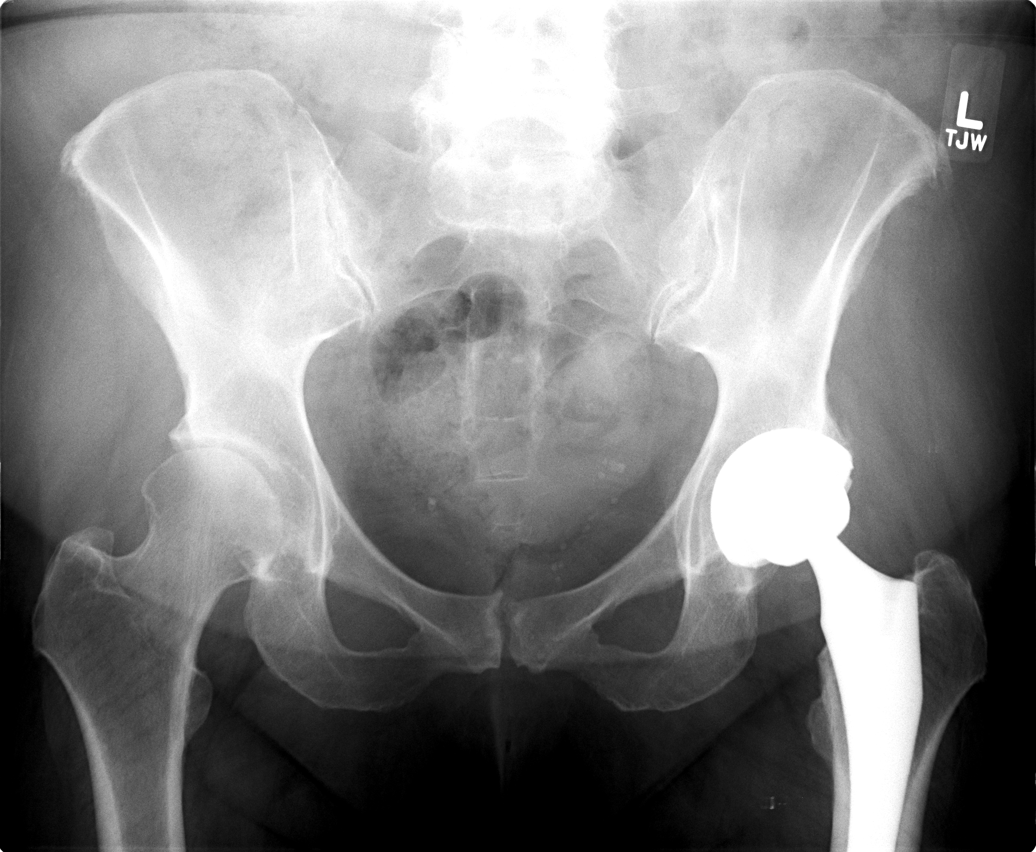

[view not recorded (2 of 5)]
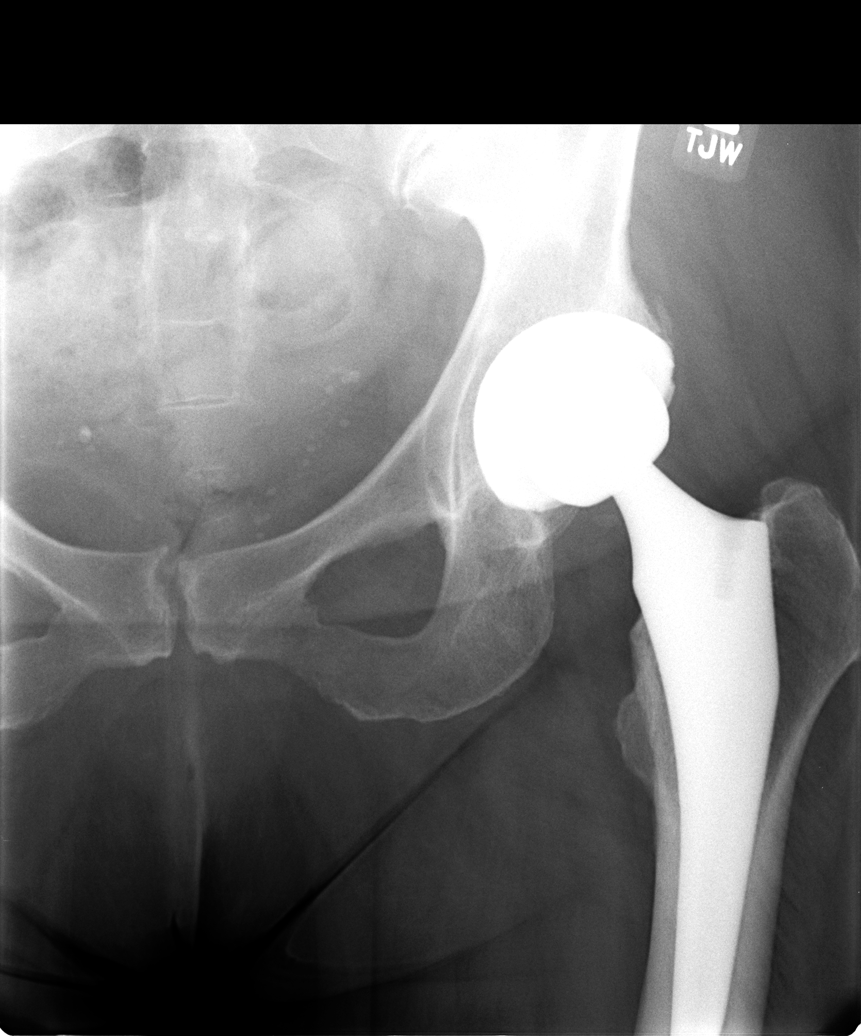

[view not recorded (3 of 5)]
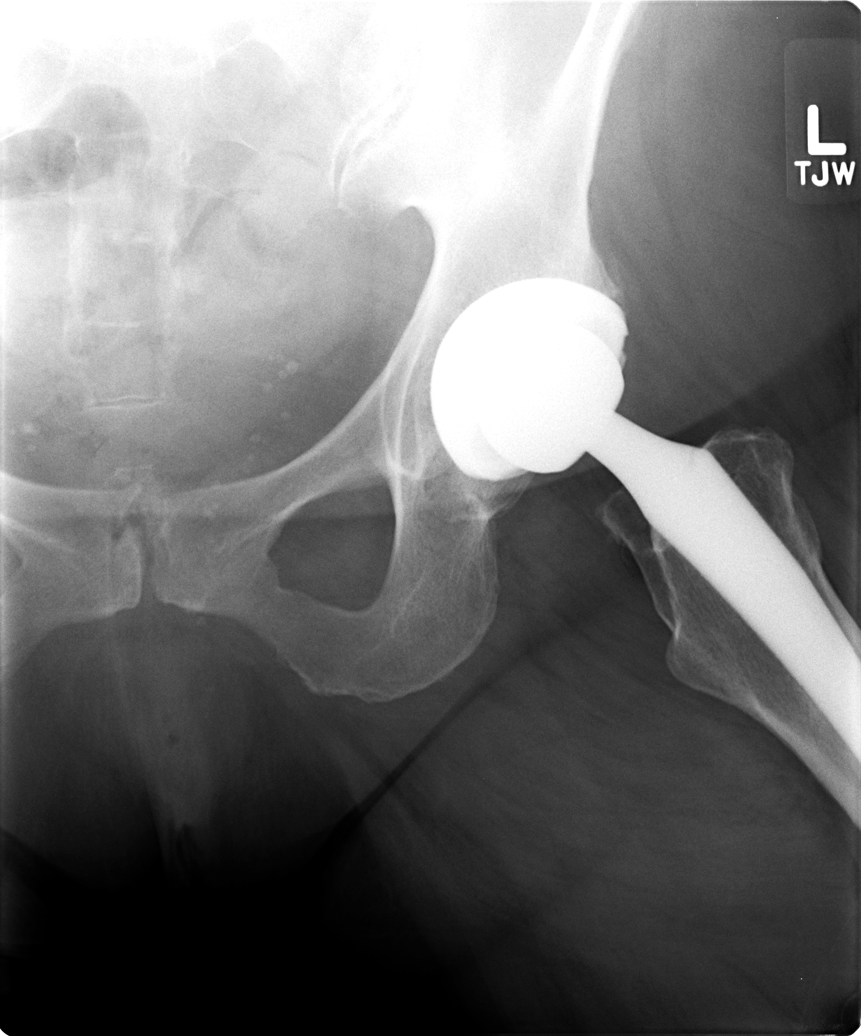

[view not recorded (4 of 5)]
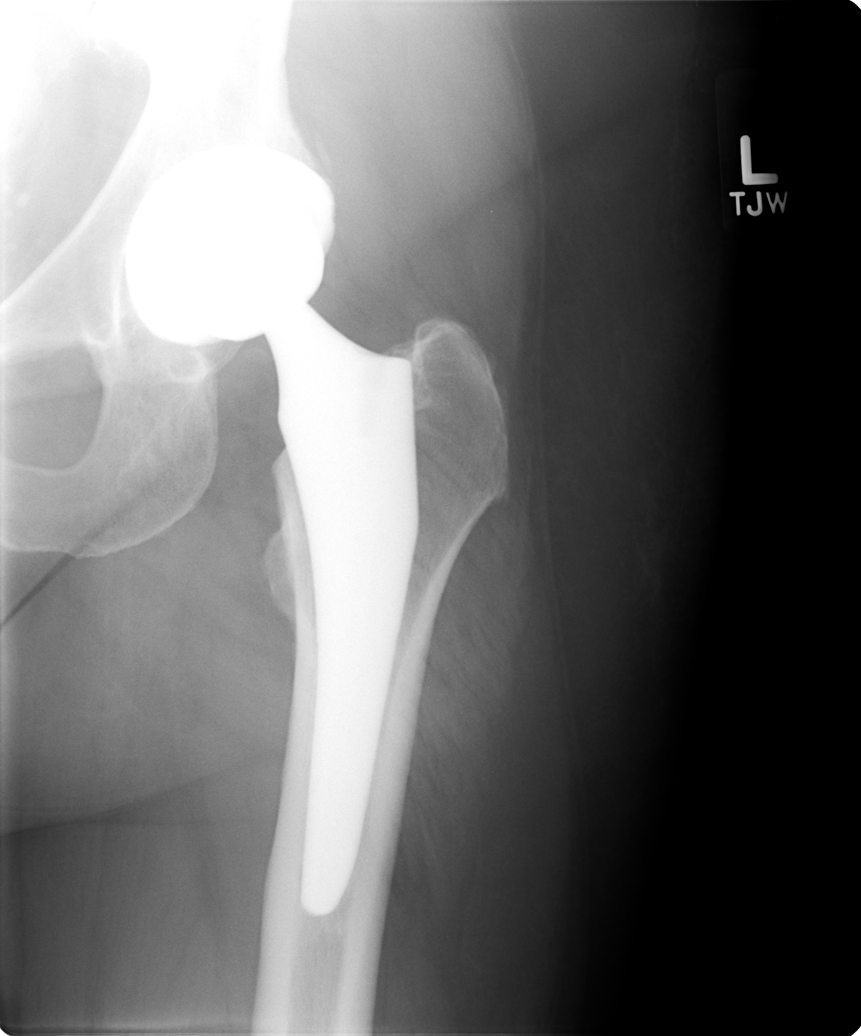

[view not recorded (5 of 5)]
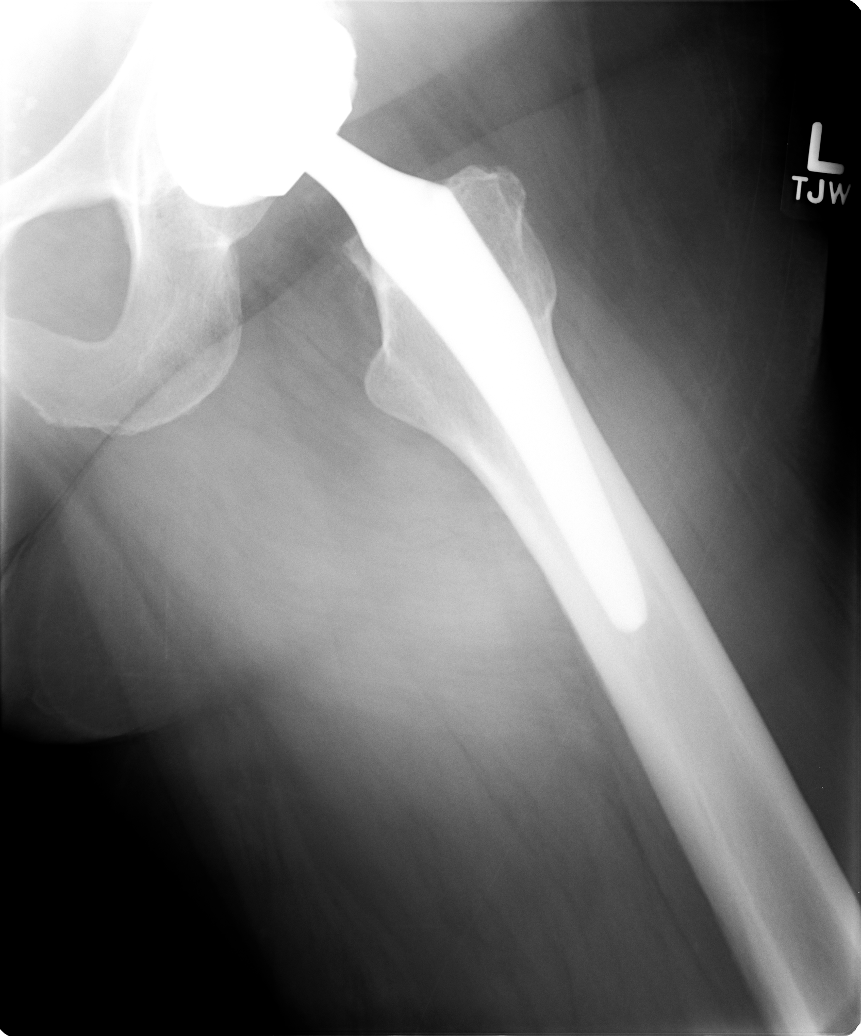

[5 of 5 positions shown; findings below may reference images not displayed]

FINDINGS: Components of LEFT hip prosthesis identified.

Bones appear slightly demineralized.

Symmetric SI joints.

Minimal narrowing of RIGHT hip joint space.

No acute fracture, dislocation, or bone destruction.

Scattered pelvic phleboliths.
IMPRESSION: No acute osseous abnormalities.

LEFT hip prosthesis.

## 2015-11-27 MED ORDER — LEVOFLOXACIN 500 MG PO TABS: 500.0000 mg | ORAL_TABLET | Freq: Every day | ORAL | Status: DC

## 2015-11-27 NOTE — Telephone Encounter (Signed)
[facility] Patient Name: Emily Moon DOB: Sep 05, 1961 Initial Comment [facility] P was prescribed Levofloxin 500mg  today. Cipro was also called in on 11-22-15. 2 different doctors called in meds. Dr. Carlis Abbott and Dr. Lorelei Pont. Wanting to clarify if patient needs to take both. Nurse Assessment Nurse: Emily Ehrich RN, Emily Moon Date/Time Emily Moon Time): 11/27/2015 6:02:01 PM Please select the assessment type ---Pharmacy clarification Additional Documentation ---Per pharmacist, Emily Moon. Pt was on cipro (1st ordered by Loma [place]) - this is last day of cipro and then today levofloxacin was ordered and this one by Dellia Nims, MD. She wants to confirm this is what they want to do.  Call Id: [id number]

## 2015-11-27 NOTE — Progress Notes (Signed)
Pre visit review using our clinic review tool, if applicable. No additional management support is needed unless otherwise documented below in the visit note. 

## 2015-11-27 NOTE — Progress Notes (Signed)
Dr. Frederico Hamman T. Ceara Wrightson, MD, [facility] [place], [address] Phone: [phone] Fax: [phone]  11/27/2015  Patient: Emily Moon, MRN: [id number], DOB: 29-Jan-1961, 55 y.o.  Primary Physician:  Sheral Flow, NP   Chief Complaint  Patient presents with  . Hip Pain    Bilateral  . Shoulder Pain    Right  . Ear Pain    Left  . Shortness of Breath   Subjective:   Emily Moon is a 55 y.o. very pleasant female patient who presents with the following:  Known very well.  She presents with multiple complaints.  She was shoveling snow last week, and now she is having quite a bit of shoulder pain and bilateral hip pain.  Pain is significantly laterally.  She denies groin pain.  She is status post total hip arthroplasty on the left.  Mild arthritis in the right hip.  Intermittent shoulder pain is also not new.  Twisted R hip and L hip is hurting her a lot.  R shoulder is hurting her a lot.   Not feeling well with URI / sinusitis.  L ear pain, also. Sinuses and drainage and tough time breathing.  sshe actually has had symptoms for one month.  It appears as if she was treated with a course of Augmentin previously, and then she developed a UTI and was treated with ciprofloxacin.  Initially she was getting better, and then she has had a worsening of symptoms.  Hurt herself shovelling the snow.   Past Medical History, Surgical History, Social History, Family History, Problem List, Medications, and Allergies have been reviewed and updated if relevant.  Patient Active Problem List   Diagnosis Date Noted  . Bipolar disorder ([place]) 05/21/2014    Priority: High  . MULTIPLE SCLEROSIS, PROGRESSIVE/RELAPSING 08/28/2010    Priority: High  . Chronic pain syndrome 01/07/2011    Priority: Medium  . Urinary retention 11/22/2015  . Medicare annual wellness visit, subsequent 10/25/2015  . Back pain 10/31/2014  .  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 Insomnia 09/01/2011  . Obesity 05/04/2011  . Perimenopausal vasomotor symptoms 02/05/2011  . HLD (hyperlipidemia) 01/20/2011  . SMOKER 01/20/2011  . RENAL CALCULUS, RECURRENT 11/13/2010  . GERD 08/28/2010  . Osteoarthritis 08/28/2010  . NEPHROLITHIASIS, HX OF 08/28/2010    Past Medical History  Diagnosis Date  . Bipolar disorder ([place]) 05/21/14  . Multiple sclerosis ([place])   . Depression   . GERD (gastroesophageal reflux disease)   . Nephrolithiasis   . Osteoporosis     osteoarthritis  . Fibromyalgia syndrome   . 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 Sleep apnea   . Arthritis   . PONV (postoperative nausea and vomiting)     no problem after cataract surgery  . Heart palpitations   . Edema     feet/legs  . Renal stone   . Urinary frequency   . Cataracts, bilateral   . Psoriasis   . Fungal infection     Finger nails    Past Surgical History  Procedure Laterality Date  . Tonsillectomy    . Joint replacement Left 2013    hip replacement  . Lithotripsy    . Cataract extraction w/phaco Left 05/21/2015    Procedure: CATARACT EXTRACTION PHACO AND INTRAOCULAR LENS PLACEMENT (IOC);  Surgeon: Birder Robson, MD;  Location: [facility] ORS;  Service: Ophthalmology;  Laterality: Left;  [place] 00:35 AP% 22.9 CDE 8.11 fluid pack lot [id number]  . Foot surgery    . Thumb arthroscopy    . Eye surgery      tissue biopsy  . Cataract extraction w/phaco Right 06/04/2015    Procedure: CATARACT EXTRACTION PHACO AND INTRAOCULAR LENS PLACEMENT (IOC);  Surgeon: Birder Robson, MD;  Location: [facility] ORS;  Service: Ophthalmology;  Laterality: Right;  US:00:48 AP%: 10.5 CDE:5.08 Fluid lot [id number]    Social History   Social History  . Marital Status: Divorced    Spouse Name: N/A  . Number of Children: 1  . Years of Education: N/A   Occupational  History  . 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 Smoking status: Former Smoker -- 0.50 packs/day for 29 years    Types: Cigarettes  . Smokeless tobacco: Never Used     Comment: recenty 04/26/2014  . Alcohol Use: No  . Drug Use: No  . Sexual Activity: No   Other Topics Concern  . Not on file   Social History Narrative    Family History  Problem Relation Age of Onset  . Cancer Father     Allergies  Allergen Reactions  . Tylenol [Acetaminophen] Shortness Of Breath and Swelling  . Ultram [Tramadol Hcl]   . Albuterol Other (See Comments)    Makes pt feel hyped up.  . Aripiprazole   . Cefaclor   . Diclofenac Sodium   . Ibuprofen   . Naproxen Sodium   . Sulfa Antibiotics   . Voltaren [Diclofenac]     Medication list reviewed and updated in full in [place].  ROS: GEN: Acute illness details above GI: Tolerating PO intake GU: maintaining adequate hydration and urination Pulm: No SOB Interactive and getting along well at home.  Otherwise, ROS is as per the HPI.   Objective:   BP 114/72 mmHg  Pulse 89  Temp(Src) 99.3 F (37.4 C) (Oral)  Ht 5\' 9"  (1.753 m)  Wt 227 lb (102.967 kg)  BMI 33.51 kg/m2  LMP 08/23/2014   Gen: WDWN, NAD; alert,appropriate and cooperative throughout exam  HEENT: Normocephalic and atraumatic. Throat clear, w/o exudate, no LAD, R TM clear, L TM - good landmarks, No fluid present. rhinnorhea.  Left frontal and maxillary sinuses: Tender Right frontal and maxillary sinuses: less Tender  Neck: No ant or post LAD CV: RRR, No M/G/R Pulm: Breathing comfortably in no resp distress. no w/c/r Abd: S,NT,ND,+BS Extr: no c/c/e Psych: full affect, pleasant   Michel Bickers and neer are also positive on the right.  HIP EXAM: SIDE: b ROM: Abduction, Flexion, Internal and External range of motion: preserved Pain with terminal IROM and EROM: minimal GTB: mild-mod ttp SLR: NEG Knees: No  effusion FABER: NT REVERSE FABER: NT, neg Piriformis: NT at direct palpation Str: flexion: 5/5 abduction: 4+/5 adduction: 5/5 Strength testing non-tender     Radiology: Dg Hip Unilat W Or W/o Pelvis 2-3 Views Left  11/28/2015  CLINICAL DATA:  LEFT hip pain, injury on snow EXAM: DG HIP (WITH OR WITHOUT PELVIS) 2-3V LEFT COMPARISON:  None FINDINGS: Components of LEFT hip prosthesis identified. Bones appear slightly demineralized. Symmetric SI joints. Minimal narrowing of RIGHT hip joint space. No acute fracture, dislocation, or bone destruction. Scattered pelvic phleboliths. IMPRESSION: No acute osseous abnormalities. LEFT hip prosthesis. Electronically Signed   By: Lavonia Dana M.D.   On: 11/28/2015 08:41    Assessment and  Plan:   Recurrent maxillary sinusitis, unspecified chronicity  Left hip pain - Plan: DG HIP UNILAT W OR W/O PELVIS 2-3 VIEWS LEFT  Right shoulder pain  Impingement syndrome, shoulder, unspecified laterality  Trochanteric bursitis of right hip  She appears to have a treatment failure sinusitis with failure of Augmentin.  Levaquin 10 days.  Multiple orthopedic issues and complaints ongoing with exacerbation from shoveling snow.  Recommended rest over the next 2-3 weeks to see if this will resolve without intervention.  She has pain medication at home, and she also has been using some lidocaine topically.  Maintain motion.  Left total hip arthroplasty is in stable position.  Follow-up: No Follow-up on file.  New Prescriptions   LEVOFLOXACIN (LEVAQUIN) 500 MG TABLET    Take 1 tablet (500 mg total) by mouth daily.   Modified Medications   No medications on file   Orders Placed This Encounter  Procedures  . DG HIP UNILAT W OR W/O PELVIS 2-3 VIEWS LEFT    Signed,  Leston Schueller T. Estevan Kersh, MD   Patient's Medications  New Prescriptions   LEVOFLOXACIN (LEVAQUIN) 500 MG TABLET    Take 1 tablet (500 mg total) by mouth daily.  Previous Medications   ASPERCREME  LIDOCAINE EX    Apply topically.   ASPIRIN 325 MG TABLET    Take 325 mg by mouth as needed.   BIOTIN PO    Take 5,000 mg by mouth daily.    CALCIPOTRIENE-BETAMETHASONE (TACLONEX) OINTMENT    Apply topically daily.   CALCIUM CARBONATE (TUMS - DOSED IN MG ELEMENTAL CALCIUM) 500 MG CHEWABLE TABLET    Chew 2 tablets by mouth daily.    CETIRIZINE (ZYRTEC) 10 MG TABLET    Take 10 mg by mouth daily.   CHOLECALCIFEROL (VITAMIN D-3 PO)    Take 10,000 Units by mouth daily.    CICLOPIROX (PENLAC) 8 % SOLUTION    Apply nightly over nail and surrounding skin. Apply daily over prior coat. After 7 days, may remove with alcohol and continue   CLOBETASOL CREAM (TEMOVATE) 0.05 %    Apply 1 application topically 2 (two) times daily.   DICLOFENAC SODIUM (VOLTAREN) 1 % GEL    Apply 4 g topically 4 (four) times daily.   DULOXETINE (CYMBALTA) 20 MG CAPSULE    Take 40 mg by mouth daily.   FLUTICASONE (FLONASE) 50 MCG/ACT NASAL SPRAY    Place 1 spray into both nostrils daily.   GARCINIA CAMBOGIA-CHROMIUM 500-200 MG-MCG TABS    Take 1 tablet by mouth 2 (two) times daily. Reported on 11/07/2015   HYDROCODONE-ACETAMINOPHEN (NORCO) 10-325 MG TABLET    Take 1 tablet by mouth every 6 (six) hours as needed.   HYDROQUINONE 4 % CREAM    APPLY TO AFFECTED AREAS ON FACE EVERY DAY AT BEDTIME   HYDROXYZINE (VISTARIL) 50 MG CAPSULE    Take 100 mg by mouth at bedtime.   LAMOTRIGINE (LAMICTAL) 150 MG TABLET    Take two tablets by mouth daily   LEVALBUTEROL (XOPENEX HFA) 45 MCG/ACT INHALER    Inhale 1-2 puffs into the lungs every 6 (six) hours as needed for wheezing.   LYSINE 1000 MG TABS    Take 2,000 mg by mouth daily.    NICOTINE (NICODERM CQ - DOSED IN MG/24 HOURS) 21 MG/24HR PATCH    Place 21 mg onto the skin daily.   OMEGA-3 FATTY ACIDS (FISH OIL PO)    Take 2 capsules by mouth at bedtime.   PHENAZOPYRIDINE  HCL (AZO TABS PO)    Take 2 tablets by mouth as needed.    POLYETHYLENE GLYCOL POWDER (GLYCOLAX/MIRALAX) POWDER    MIX 17  GRAMS (1 CAPFUL) WITH 4-8 OZ OF LIQUID AND TAKE BY MOUTH TWICE DAILY AS NEEDED   PROBIOTIC PRODUCT (PROBIOTIC PO)    Take 1 tablet by mouth daily.   RANITIDINE (ZANTAC) 150 MG TABLET    Take 150 mg by mouth 2 (two) times daily as needed for heartburn.   TAMSULOSIN (FLOMAX) 0.4 MG CAPS CAPSULE    Take 1 capsule (0.4 mg total) by mouth daily.   TEMAZEPAM (RESTORIL) 30 MG CAPSULE    TAKE ONE TO TWO CAPSULES BY MOUTH NIGHTLY AT BEDTIME AS NEEDED    TIZANIDINE (ZANAFLEX) 4 MG TABLET    TAKE 1 TABLET (4 MG TOTAL) BY MOUTH EVERY 6 (SIX) HOURS AS NEEDED.  Modified Medications   No medications on file  Discontinued Medications   CIPROFLOXACIN (CIPRO) 250 MG TABLET    Take 1 tablet (250 mg total) by mouth 2 (two) times daily.

## 2015-11-28 DIAGNOSIS — H02403 Unspecified ptosis of bilateral eyelids: Secondary | ICD-10-CM | POA: Diagnosis not present

## 2015-11-28 NOTE — Telephone Encounter (Signed)
Called and notified patient of [name]'s comments. Patient verbalized understanding.  

## 2015-11-28 NOTE — Telephone Encounter (Signed)
Please have her stop the Cipro as her urine culture did not grow out any bacteria, and continue medication as prescribed by Dr. Lorelei Pont.   (She was to stop the Cipro several days ago anyway, but that's ok.)

## 2015-12-02 ENCOUNTER — Telehealth: Payer: Self-pay

## 2015-12-02 MED ORDER — AMOXICILLIN-POT CLAVULANATE 875-125 MG PO TABS: 1.0000 | ORAL_TABLET | Freq: Two times a day (BID) | ORAL | Status: DC

## 2015-12-02 NOTE — Telephone Encounter (Signed)
Pt left v/m; pt seen 11/27/15 and taking levoquin; pt having a lot of pain in shoulder and pt is aware this is side effect of taking Levoquin; pt said she is not going to take any more Levoquin and request different abx to  [facility] [address]. Pt request cb.

## 2015-12-02 NOTE — Telephone Encounter (Signed)
Patient has been having chronic shoulder pain - very, very doubtful could be from levaquin that we started a few days ago.   Change to PCN based ABX  Augmentin 875/125, 1 po bid, #14

## 2015-12-02 NOTE — Telephone Encounter (Signed)
Called and notified patient of Dr Copland's comments. Patient verbalized understanding.

## 2015-12-02 NOTE — Telephone Encounter (Signed)
Message left for patient to return my call.  

## 2015-12-06 ENCOUNTER — Telehealth: Payer: Self-pay | Admitting: Primary Care

## 2015-12-06 NOTE — Telephone Encounter (Signed)
Please have Emily Moon stop taking the Augmentin as it's likely she could have diarrhea complications from being on numerous antibiotics in a short period of time. If her sinus symptoms continue to bother her, which it seems they have, we will need to send her to ear, nose, throat for further evaluation. For the diarrhea, please have her take Imodium and follow package directions. Let me know what she decides for ENT. I recommend.

## 2015-12-06 NOTE — Telephone Encounter (Signed)
Called and notified patient of [name]'s comments. Patient verbalized understanding.  

## 2015-12-06 NOTE — Telephone Encounter (Signed)
Patient Name: Emily Moon DOB: [birth year] Initial Comment Caller states, she is getting diarrhea after taking amoxicillin Nurse Assessment Nurse: Marcelline Deist, RN, Lynda Date/Time (Eastern Time): 12/06/2015 1:43:30 PM Confirm and document reason for call. If symptomatic, describe symptoms. You must click the next button to save text entered. ---Caller states she is getting diarrhea after taking Amoxicillin twice a day. Tried Immodium. Has been on Levaquin & Cipro. Had swelling of face after dental procedure, and sinus infection. Also, thought she had UTI, or another infection. Sinus symptoms hung on from Dec. Couldn't handle the Levaquin d/t joint pains & stomach upset. No fever. Sometimes cramping with episodes. Has the patient traveled out of the country within the last 30 days? ---Not Applicable Does the patient have any new or worsening symptoms? ---Yes Will a triage be completed? ---Yes Related visit to physician within the last 2 weeks? ---Yes Does the PT have any chronic conditions? (i.e. diabetes, asthma, etc.) ---Yes List chronic conditions. ---osteoporosis, arthritis, MS, degenerative disc Did the patient indicate they were pregnant? ---No Is this a behavioral health or substance abuse call? ---No Guidelines Guideline Title Affirmed Question Affirmed Notes Diarrhea Weak immune system (e.g., HIV positive, cancer chemo, splenectomy, organ transplant, chronic steroids) Final Disposition User See Physician within [facility], RN, Kermit Balo Comments Please contact caller at this #. She would like to speak with [name] if possible. Would like to know if she should continue with the Amoxicillin (has not taken it today), or try something else. She could not tolerate the Levaquin d/t joint pain, etc. [facility] [address]. Referrals REFERRED TO PCP OFFICE Disagree/Comply: Comply

## 2015-12-11 ENCOUNTER — Encounter: Payer: Self-pay | Admitting: Family Medicine

## 2015-12-11 ENCOUNTER — Ambulatory Visit (INDEPENDENT_AMBULATORY_CARE_PROVIDER_SITE_OTHER): Payer: PPO | Admitting: Family Medicine

## 2015-12-11 VITALS — BP 110/78 | HR 99 | Temp 99.4°F | Ht 69.0 in | Wt 215.5 lb

## 2015-12-11 DIAGNOSIS — M7061 Trochanteric bursitis, right hip: Secondary | ICD-10-CM | POA: Diagnosis not present

## 2015-12-11 DIAGNOSIS — M7541 Impingement syndrome of right shoulder: Secondary | ICD-10-CM

## 2015-12-11 DIAGNOSIS — M1990 Unspecified osteoarthritis, unspecified site: Secondary | ICD-10-CM | POA: Diagnosis not present

## 2015-12-11 DIAGNOSIS — M7062 Trochanteric bursitis, left hip: Secondary | ICD-10-CM

## 2015-12-11 DIAGNOSIS — M25511 Pain in right shoulder: Secondary | ICD-10-CM | POA: Diagnosis not present

## 2015-12-11 DIAGNOSIS — M25512 Pain in left shoulder: Secondary | ICD-10-CM

## 2015-12-11 MED ORDER — HYDROCODONE-ACETAMINOPHEN 10-325 MG PO TABS: 1.0000 | ORAL_TABLET | Freq: Four times a day (QID) | ORAL | Status: DC | PRN

## 2015-12-11 MED ORDER — METAXALONE 800 MG PO TABS: 800.0000 mg | ORAL_TABLET | Freq: Three times a day (TID) | ORAL | Status: DC

## 2015-12-11 MED ORDER — METHYLPREDNISOLONE ACETATE 40 MG/ML IJ SUSP
80.0000 mg | Freq: Once | INTRAMUSCULAR | Status: AC
  Administered 2015-12-11: 80 mg via INTRA_ARTICULAR

## 2015-12-11 MED ORDER — METHYLPREDNISOLONE ACETATE 40 MG/ML IJ SUSP
80.0000 mg | Freq: Once | INTRAMUSCULAR | Status: AC
Start: 2015-12-11 — End: 2015-12-11
  Administered 2015-12-11: 80 mg via INTRA_ARTICULAR

## 2015-12-11 NOTE — Progress Notes (Signed)
Pre visit review using our clinic review tool, if applicable. No additional management support is needed unless otherwise documented below in the visit note. 

## 2015-12-11 NOTE — Progress Notes (Signed)
Dr. Frederico Hamman T. Amal Renbarger, MD, [facility] [place], [address] Phone: [phone] Fax: [phone]  12/11/2015  Patient: Emily Moon, MRN: [id number], DOB: 1961-03-28, 55 y.o.  Primary Physician:  Sheral Flow, NP   Chief Complaint  Patient presents with  . Shoulder Pain    Bilateral  . Hip Pain    Right   Subjective:   Emily Moon is a 55 y.o. 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  Had seen her multiple times in the last few years.  B shoulder pain. Ongoing shoulder pain bilaterally, initially more in the right.  This was worsened when she was shoveling snow less than a months ago, and she is here in follow-up.  Initially I recommended basic care with ice, rest, and rice.  This has not improved, and her shoulders have become more stiff.  She is having a painful arc of motion and pain with reaching around as well as abduction.  No numbness or tingling.  Hips R > L is hurting. Lateral pain, and she is not having any true groin pain currently.  The last time I imaged her right hip there was mild osteoarthritic change in the joint.  Wt Readings from Last 3 Encounters:  12/11/15 215 lb 8 oz (97.75 kg)  11/27/15 227 lb (102.967 kg)  11/22/15 220 lb 6.4 oz (99.973 kg)     11/27/2015 Last OV with Owens Loffler, MD  Known very well.  She presents with multiple complaints.  She was shoveling snow last week, and now she is having quite a bit of shoulder pain and bilateral hip pain.  Pain is significantly laterally.  She  denies groin pain.  She is status post total hip arthroplasty on the left.  Mild arthritis in the right hip.  Intermittent shoulder pain is also not new.  Twisted R hip and L hip is hurting her a lot.  R shoulder is hurting her a lot.   Not feeling well with URI / sinusitis.  L ear pain, also. Sinuses and drainage and tough time breathing.  sshe actually has had symptoms for one month.  It appears as if she was treated with a course of Augmentin previously, and then she developed a UTI and was treated with ciprofloxacin.  Initially she was getting better, and then she has had a worsening of symptoms.  Hurt herself shovelling the snow.   Past Medical History, Surgical History, Social History, Family History, Problem List, Medications, and Allergies have been reviewed and updated if relevant.  Patient Active Problem List   Diagnosis Date Noted  . Bipolar disorder ([place]) 05/21/2014    Priority: High  . MULTIPLE SCLEROSIS, PROGRESSIVE/RELAPSING 08/28/2010    Priority: High  . Chronic pain syndrome 01/07/2011    Priority: Medium  . Urinary retention 11/22/2015  . Medicare annual wellness visit, subsequent 10/25/2015  . Back pain 10/31/2014  . 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 Insomnia 09/01/2011  . Obesity 05/04/2011  . Perimenopausal vasomotor symptoms 02/05/2011  . HLD (  hyperlipidemia) 01/20/2011  . SMOKER 01/20/2011  . RENAL CALCULUS, RECURRENT 11/13/2010  . GERD 08/28/2010  . Osteoarthritis 08/28/2010  . NEPHROLITHIASIS, HX OF 08/28/2010    Past Medical History  Diagnosis Date  . Bipolar disorder ([place]) 05/21/14  . Multiple sclerosis ([place])   . Depression   . GERD (gastroesophageal reflux disease)   . Nephrolithiasis   . Osteoporosis     osteoarthritis  . Fibromyalgia syndrome   . 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 Sleep apnea   . Arthritis   . PONV  (postoperative nausea and vomiting)     no problem after cataract surgery  . Heart palpitations   . Edema     feet/legs  . Renal stone   . Urinary frequency   . Cataracts, bilateral   . Psoriasis   . Fungal infection     Finger nails    Past Surgical History  Procedure Laterality Date  . Tonsillectomy    . Joint replacement Left 2013    hip replacement  . Lithotripsy    . Cataract extraction w/phaco Left 05/21/2015    Procedure: CATARACT EXTRACTION PHACO AND INTRAOCULAR LENS PLACEMENT (IOC);  Surgeon: Birder Robson, MD;  Location: [facility] ORS;  Service: Ophthalmology;  Laterality: Left;  [place] 00:35 AP% 22.9 CDE 8.11 fluid pack lot [id number]  . Foot surgery    . Thumb arthroscopy    . Eye surgery      tissue biopsy  . Cataract extraction w/phaco Right 06/04/2015    Procedure: CATARACT EXTRACTION PHACO AND INTRAOCULAR LENS PLACEMENT (IOC);  Surgeon: Birder Robson, MD;  Location: [facility] ORS;  Service: Ophthalmology;  Laterality: Right;  US:00:48 AP%: 10.5 CDE:5.08 Fluid lot [id number]    Social History   Social History  . Marital Status: Divorced    Spouse Name: N/A  . Number of Children: 1  . Years of Education: N/A   Occupational History  . Customer Service Rep at [employer]  . Smoking status: Former Smoker -- 0.50 packs/day for 29 years    Types: Cigarettes  . Smokeless tobacco: Never Used     Comment: recenty 04/26/2014  . Alcohol Use: No  . Drug Use: No  . Sexual Activity: No   Other Topics Concern  . Not on file   Social History Narrative    Family History  Problem Relation Age of Onset  . Cancer Father     Allergies  Allergen Reactions  . Tylenol [Acetaminophen] Shortness Of Breath and Swelling  . Ultram [Tramadol Hcl]   . Albuterol Other (See Comments)    Makes pt feel hyped up.  . Aripiprazole   . Cefaclor   . Diclofenac Sodium   . Ibuprofen   . Levaquin [Levofloxacin In D5w] Other (See Comments)     Shoulder pain  . Naproxen Sodium   . Sulfa Antibiotics   . Voltaren [Diclofenac]     Medication list reviewed and updated in full in [place].  ROS: GEN: Acute illness details above GI: Tolerating PO intake GU: maintaining adequate hydration and urination Pulm: No SOB Interactive and getting along well at home.  Otherwise, ROS is as per the HPI.   Objective:   BP 110/78 mmHg  Pulse 99  Temp(Src) 99.4 F (37.4 C) (Oral)  Ht 5\' 9"  (1.753 m)  Wt 215 lb 8 oz (97.75 kg)  BMI 31.81 kg/m2  LMP 08/23/2014   Gen: WDWN, NAD; alert,appropriate and cooperative throughout exam Neck: No  ant or post LAD Abd: S,NT,ND,+BS Extr: no c/c/e Psych: full affect, pleasant   Shoulder: b Inspection: No muscle wasting or winging Ecchymosis/edema: neg  AC joint, scapula, clavicle: NT Cervical spine: NT, full ROM Spurling's: neg Abduction: full, 5/5 - painful arc of motion Flexion: full, 5/5 IR, full, lift-off: 5/5 ER at neutral: full, 5/5 AC crossover: pos Neer: pos Hawkins: pos Drop Test: neg Empty Can: pos Supraspinatus insertion: mild-mod T Bicipital groove: NT Speed's: neg Yergason's: neg Sulcus sign: neg Scapular dyskinesis: none C5-T1 intact  Neuro: Sensation intact Grip 5/5   HIP EXAM: SIDE: b ROM: Abduction, Flexion, Internal and External range of motion: preserved Pain with terminal IROM and EROM: minimal GTB: mild-mod ttp b SLR: NEG Knees: No effusion FABER: NT REVERSE FABER: NT, neg Piriformis: NT at direct palpation Str: flexion: 5/5 abduction: 4+/5 adduction: 5/5 Strength testing non-tender  Radiology: Dg Hip Unilat W Or W/o Pelvis 2-3 Views Left  11/28/2015  CLINICAL DATA:  LEFT hip pain, injury on snow EXAM: DG HIP (WITH OR WITHOUT PELVIS) 2-3V LEFT COMPARISON:  None FINDINGS: Components of LEFT hip prosthesis identified. Bones appear slightly demineralized. Symmetric SI joints. Minimal narrowing of RIGHT hip joint space. No acute fracture,  dislocation, or bone destruction. Scattered pelvic phleboliths. IMPRESSION: No acute osseous abnormalities. LEFT hip prosthesis. Electronically Signed   By: Lavonia Dana M.D.   On: 11/28/2015 08:41    Assessment and Plan:   Pain of both shoulder joints  Osteoarthritis, unspecified osteoarthritis type, unspecified site - Plan: HYDROcodone-acetaminophen (NORCO) 10-325 MG tablet, methylPREDNISolone acetate (DEPO-MEDROL) injection 80 mg, methylPREDNISolone acetate (DEPO-MEDROL) injection 80 mg  Shoulder impingement, right  Trochanteric bursitis of both hips  Complex case, chronic pain syndrome with possible fibromyalgia and more pain with multiple joints then is typically seen in most 54 year old's.  I have strongly encouraged her to become more active again, but there have been some financial limitations.  Change to Skelaxin as opposed to Zanaflex to hopefully help with some of her many pain complaints. I discussed the case with her PCP as well. Refilled her chronic narcotics while she is here in the office, approved by her primary care provider.  Some limitation with overall corticosteroid use, and she had a manic episode several years ago after I am steroids.  Currently very stable, and I think that doing to injections is probably reasonable - will try combined approach.  Intrarticular Shoulder Injection, R Verbal consent was obtained from the patient. Risks including infection explained and contrasted with benefits and alternatives. Patient prepped with Chloraprep and Ethyl Chloride used for anesthesia. An intraarticular shoulder injection was performed using the posterior approach. The patient tolerated the procedure well and had decreased pain post injection. No complications. Injection: 4 cc of Lidocaine 1% and 1 mL Depo-Medrol 40 mg. Needle: 22 gauge   SubAC Injection, R Verbal consent was obtained from the patient. Risks (including rare infection), benefits, and alternatives were  explained. Patient prepped with Chloraprep and Ethyl Chloride used for anesthesia. The subacromial space was injected using the posterior approach. The patient tolerated the procedure well and had decreased pain post injection. No complications. Injection: 4 cc of Lidocaine 1% and 1 mL of Depo-Medrol 40 mg. Needle: 22 gauge    Intrarticular Shoulder Injection, L Verbal consent was obtained from the patient. Risks including infection explained and contrasted with benefits and alternatives. Patient prepped with Chloraprep and Ethyl Chloride used for anesthesia. An intraarticular shoulder injection was performed using the posterior approach. The patient tolerated  the procedure well and had decreased pain post injection. No complications. Injection: 4 cc of Lidocaine 1% and 1 mL Depo-Medrol 40 mg. Needle: 22 gauge   SubAC Injection, L Verbal consent was obtained from the patient. Risks (including rare infection), benefits, and alternatives were explained. Patient prepped with Chloraprep and Ethyl Chloride used for anesthesia. The subacromial space was injected using the posterior approach. The patient tolerated the procedure well and had decreased pain post injection. No complications. Injection: 4 cc of Lidocaine 1% and 1 mL of Depo-Medrol 40 mg. Needle: 22 gauge     Follow-up: No Follow-up on file.  New Prescriptions   METAXALONE (SKELAXIN) 800 MG TABLET    Take 1 tablet (800 mg total) by mouth 3 (three) times daily.   Modified Medications   Modified Medication Previous Medication   HYDROCODONE-ACETAMINOPHEN (NORCO) 10-325 MG TABLET HYDROcodone-acetaminophen (NORCO) 10-325 MG tablet      Take 1 tablet by mouth every 6 (six) hours as needed.    Take 1 tablet by mouth every 6 (six) hours as needed.   No orders of the defined types were placed in this encounter.    Signed,  Maud Deed. Hilarie Sinha, MD   Patient's Medications  New Prescriptions   METAXALONE (SKELAXIN) 800 MG TABLET    Take 1  tablet (800 mg total) by mouth 3 (three) times daily.  Previous Medications   ASPERCREME LIDOCAINE EX    Apply topically.   ASPIRIN 325 MG TABLET    Take 325 mg by mouth as needed.   BIOTIN PO    Take 5,000 mg by mouth daily.    CALCIPOTRIENE-BETAMETHASONE (TACLONEX) OINTMENT    Apply topically daily.   CALCIUM CARBONATE (TUMS - DOSED IN MG ELEMENTAL CALCIUM) 500 MG CHEWABLE TABLET    Chew 2 tablets by mouth daily.    CETIRIZINE (ZYRTEC) 10 MG TABLET    Take 10 mg by mouth daily.   CHOLECALCIFEROL (VITAMIN D-3 PO)    Take 10,000 Units by mouth daily.    CICLOPIROX (PENLAC) 8 % SOLUTION    Apply nightly over nail and surrounding skin. Apply daily over prior coat. After 7 days, may remove with alcohol and continue   CLOBETASOL CREAM (TEMOVATE) 0.05 %    Apply 1 application topically 2 (two) times daily.   DICLOFENAC SODIUM (VOLTAREN) 1 % GEL    Apply 4 g topically 4 (four) times daily.   DULOXETINE (CYMBALTA) 20 MG CAPSULE    Take 40 mg by mouth daily.   FLUTICASONE (FLONASE) 50 MCG/ACT NASAL SPRAY    Place 1 spray into both nostrils daily.   GARCINIA CAMBOGIA-CHROMIUM 500-200 MG-MCG TABS    Take 1 tablet by mouth 2 (two) times daily. Reported on 11/07/2015   HYDROQUINONE 4 % CREAM    APPLY TO AFFECTED AREAS ON FACE EVERY DAY AT BEDTIME   HYDROXYZINE (VISTARIL) 50 MG CAPSULE    Take 100 mg by mouth at bedtime.   LAMOTRIGINE (LAMICTAL) 150 MG TABLET    Take two tablets by mouth daily   LEVALBUTEROL (XOPENEX HFA) 45 MCG/ACT INHALER    Inhale 1-2 puffs into the lungs every 6 (six) hours as needed for wheezing.   LYSINE 1000 MG TABS    Take 2,000 mg by mouth daily.    NICOTINE (NICODERM CQ - DOSED IN MG/24 HOURS) 21 MG/24HR PATCH    Place 21 mg onto the skin daily.   OMEGA-3 FATTY ACIDS (FISH OIL PO)    Take 2 capsules by  mouth at bedtime.   PHENAZOPYRIDINE HCL (AZO TABS PO)    Take 2 tablets by mouth as needed.    POLYETHYLENE GLYCOL POWDER (GLYCOLAX/MIRALAX) POWDER    MIX 17 GRAMS (1 CAPFUL) WITH  4-8 OZ OF LIQUID AND TAKE BY MOUTH TWICE DAILY AS NEEDED   PROBIOTIC PRODUCT (PROBIOTIC PO)    Take 1 tablet by mouth daily.   RANITIDINE (ZANTAC) 150 MG TABLET    Take 150 mg by mouth 2 (two) times daily as needed for heartburn.   TAMSULOSIN (FLOMAX) 0.4 MG CAPS CAPSULE    Take 1 capsule (0.4 mg total) by mouth daily.   TEMAZEPAM (RESTORIL) 30 MG CAPSULE    TAKE ONE TO TWO CAPSULES BY MOUTH NIGHTLY AT BEDTIME AS NEEDED   Modified Medications   Modified Medication Previous Medication   HYDROCODONE-ACETAMINOPHEN (NORCO) 10-325 MG TABLET HYDROcodone-acetaminophen (NORCO) 10-325 MG tablet      Take 1 tablet by mouth every 6 (six) hours as needed.    Take 1 tablet by mouth every 6 (six) hours as needed.  Discontinued Medications   AMOXICILLIN-CLAVULANATE (AUGMENTIN) 875-125 MG TABLET    Take 1 tablet by mouth 2 (two) times daily.   LEVOFLOXACIN (LEVAQUIN) 500 MG TABLET    Take 1 tablet (500 mg total) by mouth daily.   TIZANIDINE (ZANAFLEX) 4 MG TABLET    TAKE 1 TABLET (4 MG TOTAL) BY MOUTH EVERY 6 (SIX) HOURS AS NEEDED.

## 2015-12-12 ENCOUNTER — Telehealth: Payer: Self-pay | Admitting: Primary Care

## 2015-12-12 MED ORDER — TIZANIDINE HCL 4 MG PO TABS: 8.0000 mg | ORAL_TABLET | Freq: Three times a day (TID) | ORAL | Status: DC

## 2015-12-12 NOTE — Telephone Encounter (Signed)
Gracey notified as instructed by telephone.  New Prescription sent to [facility] [address]

## 2015-12-12 NOTE — Telephone Encounter (Signed)
i double checked a couple of other sources. Some of the other muscle relaxants she won't be able to take or are too expensive / brand meds.   Increase generic Zanaflex (tizanadine) 4 mg tabs to 2 tabs po TID, #180, 5 ref  Hopefull this will help. So, double her old zanaflex / tizanadine

## 2015-12-12 NOTE — Telephone Encounter (Signed)
Pt called stating the skelaxin that dr copland called in is $46 with insurance.  She stated she couldn't afford this med can something else be called in or do you want her to adjust her current med  flexrell does not work  Please advise pt what to do  [facility]

## 2016-01-02 ENCOUNTER — Ambulatory Visit (INDEPENDENT_AMBULATORY_CARE_PROVIDER_SITE_OTHER): Payer: PPO | Admitting: Family Medicine

## 2016-01-02 ENCOUNTER — Encounter: Payer: Self-pay | Admitting: Family Medicine

## 2016-01-02 VITALS — BP 120/80 | HR 109 | Temp 99.2°F | Ht 69.0 in | Wt 217.2 lb

## 2016-01-02 DIAGNOSIS — M1990 Unspecified osteoarthritis, unspecified site: Secondary | ICD-10-CM | POA: Diagnosis not present

## 2016-01-02 DIAGNOSIS — M7062 Trochanteric bursitis, left hip: Secondary | ICD-10-CM

## 2016-01-02 MED ORDER — METHYLPREDNISOLONE ACETATE 40 MG/ML IJ SUSP
80.0000 mg | Freq: Once | INTRAMUSCULAR | Status: AC
  Administered 2016-01-02: 80 mg via INTRA_ARTICULAR

## 2016-01-02 MED ORDER — HYDROCODONE-ACETAMINOPHEN 10-325 MG PO TABS: 1.0000 | ORAL_TABLET | Freq: Four times a day (QID) | ORAL | Status: DC | PRN

## 2016-01-02 NOTE — Progress Notes (Signed)
Pre visit review using our clinic review tool, if applicable. No additional management support is needed unless otherwise documented below in the visit note. 

## 2016-01-02 NOTE — Progress Notes (Signed)
Dr. Frederico Hamman T. Leafy Motsinger, MD, [facility] [place], [address] Phone: [phone] Fax: [phone]  01/02/2016  Patient: Emily Moon, MRN: [id number], DOB: 1961-01-24, 55 y.o.  Primary Physician:  Sheral Flow, NP   Chief Complaint  Patient presents with  . Hip Pain    Left   Subjective:   Emily Moon is a 55 y.o. very pleasant female patient who presents with the following:  Left hip pain - all the time, lying down.    well-known patient with fibromyalgia, bipolar disorder who presents with left-sided lateral hip pain, status post left total hip arthroplasty who has had trochanteric bursitis on the left in the past. She also has had multiple other bone and joint issues and complaints and chronic pain in various parts of her body. Shoulders are doing much better compared to last visit.   primary left lateral hip pain right now.  12/11/2015 Last OV with Owens Loffler, MD  Complicated medical patient who has a history of multiple sclerosis and is not on medication, chronic pain syndrome with multiple painful areas throughout multiple joints, Bipolar disorder currently well controlled, carries a diagnosis of fibromyalgia, status post left total hip arthroplasty with hip dysplasia on the right and mild osteoarthritic change on the right.  Had seen her multiple times in the last few years.  B shoulder pain. Ongoing shoulder pain bilaterally, initially more in the right.  This was worsened when she was shoveling snow less than a months ago, and she is here in follow-up.  Initially I recommended basic care with ice, rest, and rice.  This has not improved, and her shoulders have become more stiff.  She is having a painful arc of motion and pain with reaching around as well as abduction.  No numbness or tingling.  Hips R > L is hurting. Lateral pain, and she is not having any true groin pain currently.  The last  time I imaged her right hip there was mild osteoarthritic change in the joint.  Wt Readings from Last 3 Encounters:  01/02/16 217 lb 4 oz (98.544 kg)  12/11/15 215 lb 8 oz (97.75 kg)  11/27/15 227 lb (102.967 kg)     11/27/2015 Last OV with Owens Loffler, MD  Known very well.  She presents with multiple complaints.  She was shoveling snow last week, and now she is having quite a bit of shoulder pain and bilateral hip pain.  Pain is significantly laterally.  She denies groin pain.  She is status post total hip arthroplasty on the left.  Mild arthritis in the right hip.  Intermittent shoulder pain is also not new.  Twisted R hip and L hip is hurting her a lot.  R shoulder is hurting her a lot.   Not feeling well with URI / sinusitis.  L ear pain, also. Sinuses and drainage and tough time breathing.  sshe actually has had symptoms for one month.  It appears as if she was treated with a course of Augmentin previously, and then she developed a UTI and was treated with ciprofloxacin.  Initially she was getting better, and then she has had a worsening of symptoms.  Hurt herself shovelling the snow.   Past Medical History, Surgical History, Social History, Family History, Problem List, Medications, and Allergies have been reviewed and updated if relevant.  Patient Active Problem List   Diagnosis Date Noted  . Bipolar disorder ([place]) 05/21/2014  Priority: High  . MULTIPLE SCLEROSIS, PROGRESSIVE/RELAPSING 08/28/2010    Priority: High  . Chronic pain syndrome 01/07/2011    Priority: Medium  . Urinary retention 11/22/2015  . Medicare annual wellness visit, subsequent 10/25/2015  . Back pain 10/31/2014  . 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 Insomnia 09/01/2011  . Obesity 05/04/2011  . Perimenopausal vasomotor symptoms 02/05/2011  . HLD (hyperlipidemia) 01/20/2011  . SMOKER 01/20/2011    . RENAL CALCULUS, RECURRENT 11/13/2010  . GERD 08/28/2010  . Osteoarthritis 08/28/2010  . NEPHROLITHIASIS, HX OF 08/28/2010    Past Medical History  Diagnosis Date  . Bipolar disorder ([place]) 05/21/14  . Multiple sclerosis ([place])   . Depression   . GERD (gastroesophageal reflux disease)   . Nephrolithiasis   . Osteoporosis     osteoarthritis  . Fibromyalgia syndrome   . 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 Sleep apnea   . Arthritis   . PONV (postoperative nausea and vomiting)     no problem after cataract surgery  . Heart palpitations   . Edema     feet/legs  . Renal stone   . Urinary frequency   . Cataracts, bilateral   . Psoriasis   . Fungal infection     Finger nails    Past Surgical History  Procedure Laterality Date  . Tonsillectomy    . Joint replacement Left 2013    hip replacement  . Lithotripsy    . Cataract extraction w/phaco Left 05/21/2015    Procedure: CATARACT EXTRACTION PHACO AND INTRAOCULAR LENS PLACEMENT (IOC);  Surgeon: Birder Robson, MD;  Location: [facility] ORS;  Service: Ophthalmology;  Laterality: Left;  [place] 00:35 AP% 22.9 CDE 8.11 fluid pack lot [id number]  . Foot surgery    . Thumb arthroscopy    . Eye surgery      tissue biopsy  . Cataract extraction w/phaco Right 06/04/2015    Procedure: CATARACT EXTRACTION PHACO AND INTRAOCULAR LENS PLACEMENT (IOC);  Surgeon: Birder Robson, MD;  Location: [facility] ORS;  Service: Ophthalmology;  Laterality: Right;  US:00:48 AP%: 10.5 CDE:5.08 Fluid lot [id number]    Social History   Social History  . Marital Status: Divorced    Spouse Name: N/A  . Number of Children: 1  . Years of Education: N/A   Occupational History  . 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 Smoking status: Former Smoker -- 0.50 packs/day for 29 years    Types: Cigarettes  . Smokeless tobacco: Never Used     Comment: recenty 04/26/2014  . Alcohol Use: No  . Drug Use: No  . Sexual Activity: No    Other Topics Concern  . Not on file   Social History Narrative    Family History  Problem Relation Age of Onset  . Cancer Father     Allergies  Allergen Reactions  . Tylenol [Acetaminophen] Shortness Of Breath and Swelling  . Ultram [Tramadol Hcl]   . Albuterol Other (See Comments)    Makes pt feel hyped up.  . Aripiprazole   . Cefaclor   . Diclofenac Sodium   . Ibuprofen   . Levaquin [Levofloxacin In D5w] Other (See Comments)    Shoulder pain  . Naproxen Sodium   . Sulfa Antibiotics   . Voltaren [Diclofenac]     Medication list reviewed and updated in full in [place].  ROS: GEN: Acute  illness details above GI: Tolerating PO intake GU: maintaining adequate hydration and urination Pulm: No SOB Interactive and getting along well at home.  Otherwise, ROS is as per the HPI.   Objective:   BP 120/80 mmHg  Pulse 109  Temp(Src) 99.2 F (37.3 C) (Oral)  Ht 5\' 9"  (1.753 m)  Wt 217 lb 4 oz (98.544 kg)  BMI 32.07 kg/m2  LMP 08/23/2014   Gen: WDWN, NAD; alert,appropriate and cooperative throughout exam Neck: No ant or post LAD Abd: S,NT,ND,+BS Extr: no c/c/e Psych: full affect, pleasant   HIP EXAM: SIDE: b ROM: Abduction, Flexion, Internal and External range of motion: preserved Pain with terminal IROM and EROM: minimal GTB: mod-severe L GTB SLR: NEG Knees: No effusion FABER: NT REVERSE FABER: NT, neg Piriformis: NT at direct palpation Str: flexion: 5/5 abduction: 4+/5 adduction: 5/5 Strength testing non-tender  Radiology: No results found.  Assessment and Plan:   Trochanteric bursitis of left hip  Osteoarthritis, unspecified osteoarthritis type, unspecified site - Plan: HYDROcodone-acetaminophen (NORCO) 10-325 MG tablet, methylPREDNISolone acetate (DEPO-MEDROL) injection 80 mg  Classic GTB  Trochanteric Bursitis Injection, L Verbal consent obtained. Risks (including infection, potential atrophy), benefits, and alternatives reviewed.  Greater trochanter sterilely prepped with Chloraprep. Ethyl Chloride used for anesthesia. 8 cc of Lidocaine 1% injected with 2 mL of Depo-Medrol 40 mg into trochanteric bursa at area of maximal tenderness at greater trochanter. Needle taken to bone to troch bursa, flows easily. Bursa massaged. No bleeding and no complications. Decreased pain after injection. Needle: 22 gauge spinal needle   Follow-up: prn  Modified Medications   Modified Medication Previous Medication   HYDROCODONE-ACETAMINOPHEN (NORCO) 10-325 MG TABLET HYDROcodone-acetaminophen (NORCO) 10-325 MG tablet      Take 1 tablet by mouth every 6 (six) hours as needed.    Take 1 tablet by mouth every 6 (six) hours as needed.   Signed,  Maud Deed. Clarrissa Shimkus, MD   Patient's Medications  New Prescriptions   No medications on file  Previous Medications   ASPERCREME LIDOCAINE EX    Apply topically.   ASPIRIN 325 MG TABLET    Take 325 mg by mouth as needed.   BIOTIN PO    Take 5,000 mg by mouth daily.    CALCIPOTRIENE-BETAMETHASONE (TACLONEX) OINTMENT    Apply topically daily.   CALCIUM CARBONATE (TUMS - DOSED IN MG ELEMENTAL CALCIUM) 500 MG CHEWABLE TABLET    Chew 2 tablets by mouth daily.    CETIRIZINE (ZYRTEC) 10 MG TABLET    Take 10 mg by mouth daily.   CHOLECALCIFEROL (VITAMIN D-3 PO)    Take 10,000 Units by mouth daily.    CICLOPIROX (PENLAC) 8 % SOLUTION    Apply nightly over nail and surrounding skin. Apply daily over prior coat. After 7 days, may remove with alcohol and continue   CLOBETASOL CREAM (TEMOVATE) 0.05 %    Apply 1 application topically 2 (two) times daily.   DICLOFENAC SODIUM (VOLTAREN) 1 % GEL    Apply 4 g topically 4 (four) times daily.   DULOXETINE (CYMBALTA) 20 MG CAPSULE    Take 40 mg by mouth daily.   FLUTICASONE (FLONASE) 50 MCG/ACT NASAL SPRAY    Place 1 spray into both nostrils daily.   GARCINIA CAMBOGIA-CHROMIUM 500-200 MG-MCG TABS    Take 1 tablet by mouth 2 (two) times daily. Reported on 11/07/2015    HYDROQUINONE 4 % CREAM    APPLY TO AFFECTED AREAS ON FACE EVERY DAY AT BEDTIME   HYDROXYZINE (VISTARIL)  50 MG CAPSULE    Take 100 mg by mouth at bedtime.   LAMOTRIGINE (LAMICTAL) 150 MG TABLET    Take two tablets by mouth daily   LEVALBUTEROL (XOPENEX HFA) 45 MCG/ACT INHALER    Inhale 1-2 puffs into the lungs every 6 (six) hours as needed for wheezing.   LYSINE 1000 MG TABS    Take 2,000 mg by mouth daily.    METAXALONE (SKELAXIN) 800 MG TABLET    Take 1 tablet (800 mg total) by mouth 3 (three) times daily.   NICOTINE (NICODERM CQ - DOSED IN MG/24 HOURS) 21 MG/24HR PATCH    Place 21 mg onto the skin daily.   OMEGA-3 FATTY ACIDS (FISH OIL PO)    Take 2 capsules by mouth at bedtime.   PHENAZOPYRIDINE HCL (AZO TABS PO)    Take 2 tablets by mouth as needed.    POLYETHYLENE GLYCOL POWDER (GLYCOLAX/MIRALAX) POWDER    MIX 17 GRAMS (1 CAPFUL) WITH 4-8 OZ OF LIQUID AND TAKE BY MOUTH TWICE DAILY AS NEEDED   PROBIOTIC PRODUCT (PROBIOTIC PO)    Take 1 tablet by mouth daily.   RANITIDINE (ZANTAC) 150 MG TABLET    Take 150 mg by mouth 2 (two) times daily as needed for heartburn.   TAMSULOSIN (FLOMAX) 0.4 MG CAPS CAPSULE    Take 1 capsule (0.4 mg total) by mouth daily.   TEMAZEPAM (RESTORIL) 30 MG CAPSULE    TAKE ONE TO TWO CAPSULES BY MOUTH NIGHTLY AT BEDTIME AS NEEDED    TIZANIDINE (ZANAFLEX) 4 MG TABLET    Take 2 tablets (8 mg total) by mouth 3 (three) times daily.  Modified Medications   Modified Medication Previous Medication   HYDROCODONE-ACETAMINOPHEN (NORCO) 10-325 MG TABLET HYDROcodone-acetaminophen (NORCO) 10-325 MG tablet      Take 1 tablet by mouth every 6 (six) hours as needed.    Take 1 tablet by mouth every 6 (six) hours as needed.  Discontinued Medications   No medications on file

## 2016-01-06 ENCOUNTER — Ambulatory Visit (INDEPENDENT_AMBULATORY_CARE_PROVIDER_SITE_OTHER)
Admission: RE | Admit: 2016-01-06 | Discharge: 2016-01-06 | Disposition: A | Discharge status: Home / Self Care | Payer: PPO | Source: Ambulatory Visit | Attending: Family Medicine | Admitting: Family Medicine

## 2016-01-06 ENCOUNTER — Ambulatory Visit (INDEPENDENT_AMBULATORY_CARE_PROVIDER_SITE_OTHER): Payer: PPO | Admitting: Family Medicine

## 2016-01-06 ENCOUNTER — Encounter: Payer: Self-pay | Admitting: Family Medicine

## 2016-01-06 VITALS — BP 106/60 | HR 100 | Temp 98.9°F | Ht 69.0 in | Wt 217.5 lb

## 2016-01-06 DIAGNOSIS — M25572 Pain in left ankle and joints of left foot: Secondary | ICD-10-CM | POA: Diagnosis not present

## 2016-01-06 DIAGNOSIS — Y92099 Unspecified place in other non-institutional residence as the place of occurrence of the external cause: Secondary | ICD-10-CM

## 2016-01-06 DIAGNOSIS — M25531 Pain in right wrist: Secondary | ICD-10-CM

## 2016-01-06 DIAGNOSIS — M7989 Other specified soft tissue disorders: Secondary | ICD-10-CM | POA: Diagnosis not present

## 2016-01-06 DIAGNOSIS — W19XXXA Unspecified fall, initial encounter: Secondary | ICD-10-CM

## 2016-01-06 DIAGNOSIS — M25562 Pain in left knee: Secondary | ICD-10-CM

## 2016-01-06 DIAGNOSIS — M25532 Pain in left wrist: Secondary | ICD-10-CM

## 2016-01-06 DIAGNOSIS — M25561 Pain in right knee: Secondary | ICD-10-CM

## 2016-01-06 DIAGNOSIS — S6991XA Unspecified injury of right wrist, hand and finger(s), initial encounter: Secondary | ICD-10-CM | POA: Diagnosis not present

## 2016-01-06 DIAGNOSIS — Y92009 Unspecified place in unspecified non-institutional (private) residence as the place of occurrence of the external cause: Secondary | ICD-10-CM

## 2016-01-06 IMAGING — DX DG WRIST COMPLETE 3+V*R*
3 series · 3 of 3 positions shown · non-contrast
Comparison: [DATE].

CLINICAL DATA: Trauma.  Fall.

EXAM:
RIGHT WRIST - COMPLETE 3+ VIEW

[wrist ap]
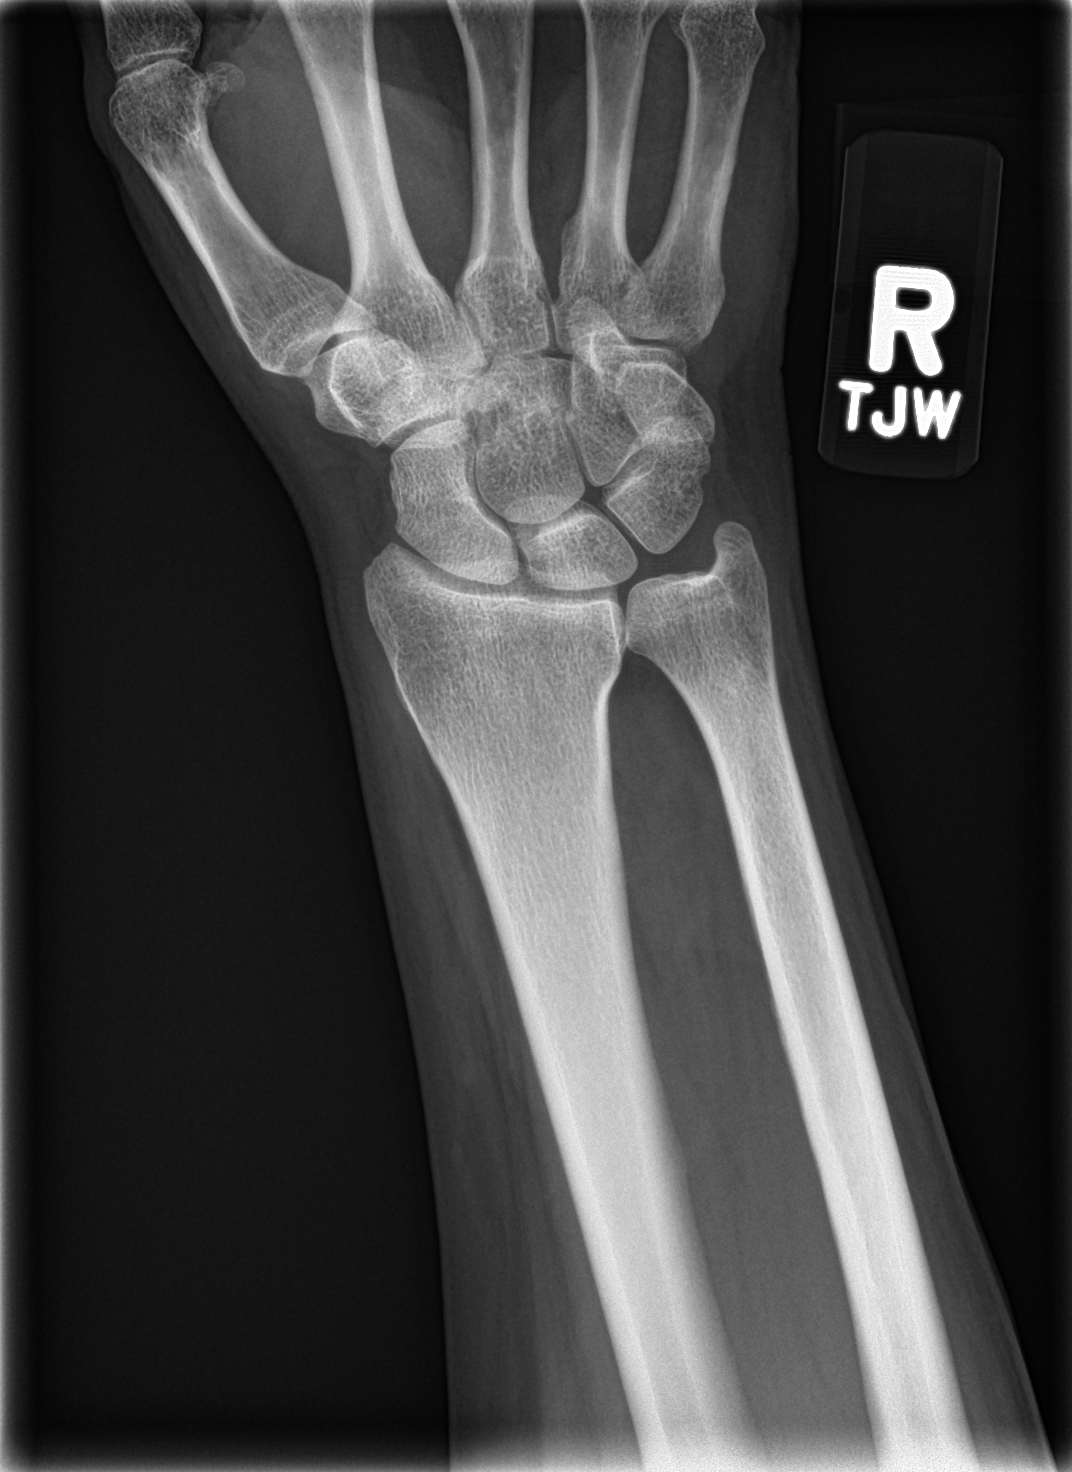

[wrist obl]
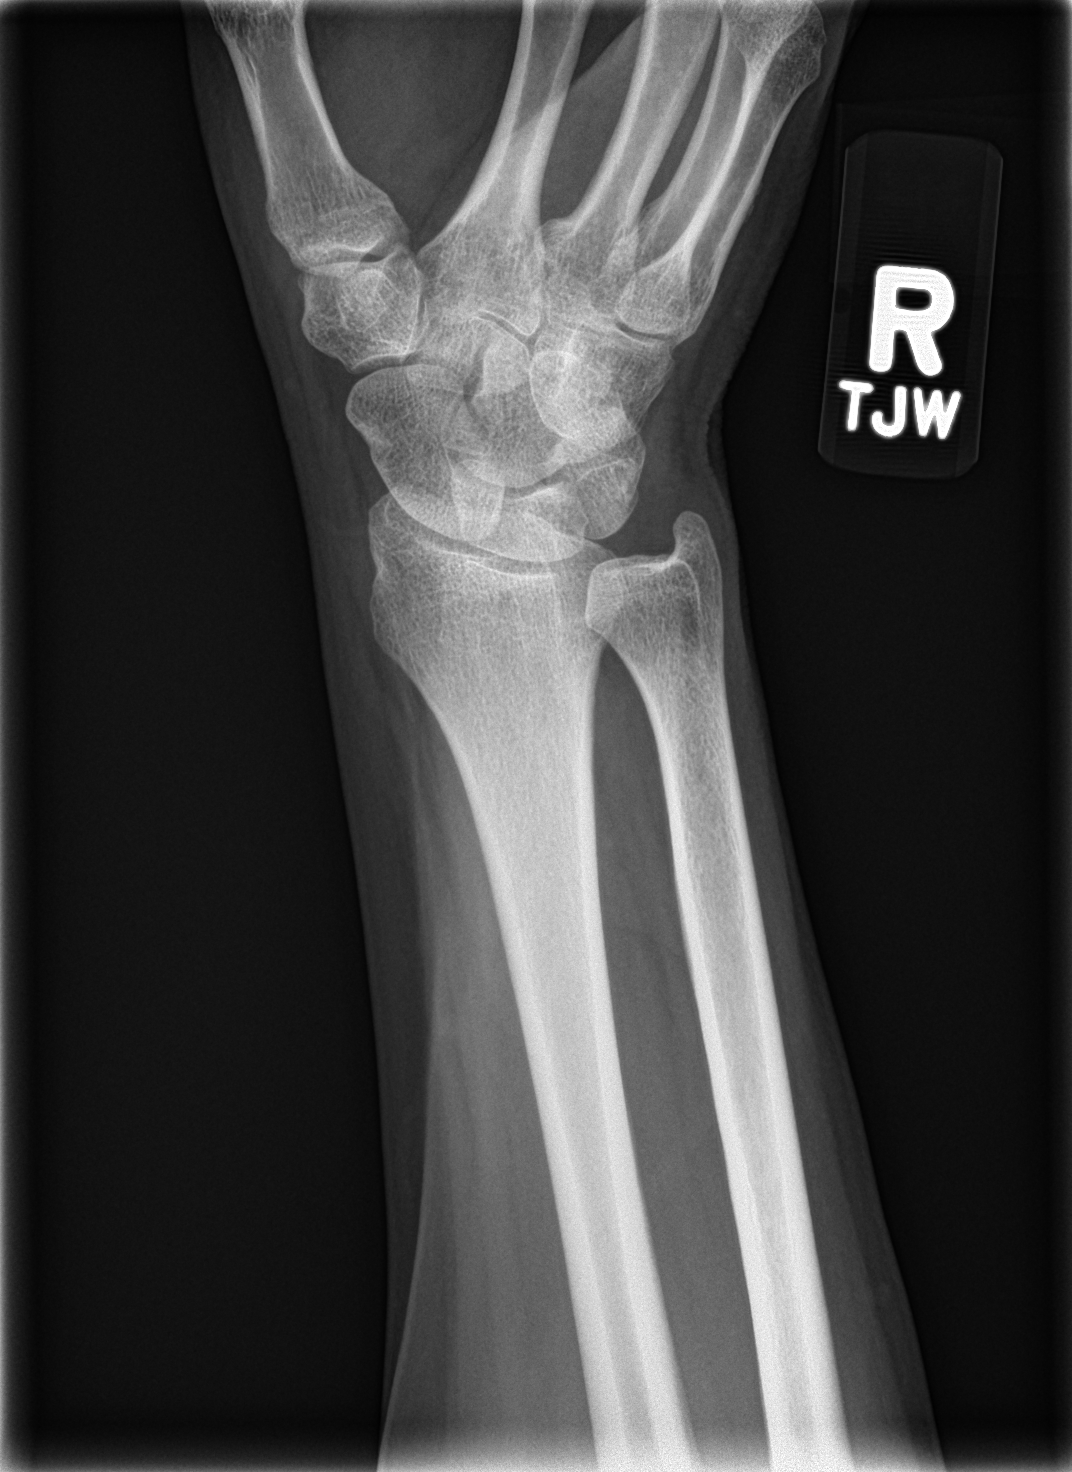

[wrist lat]
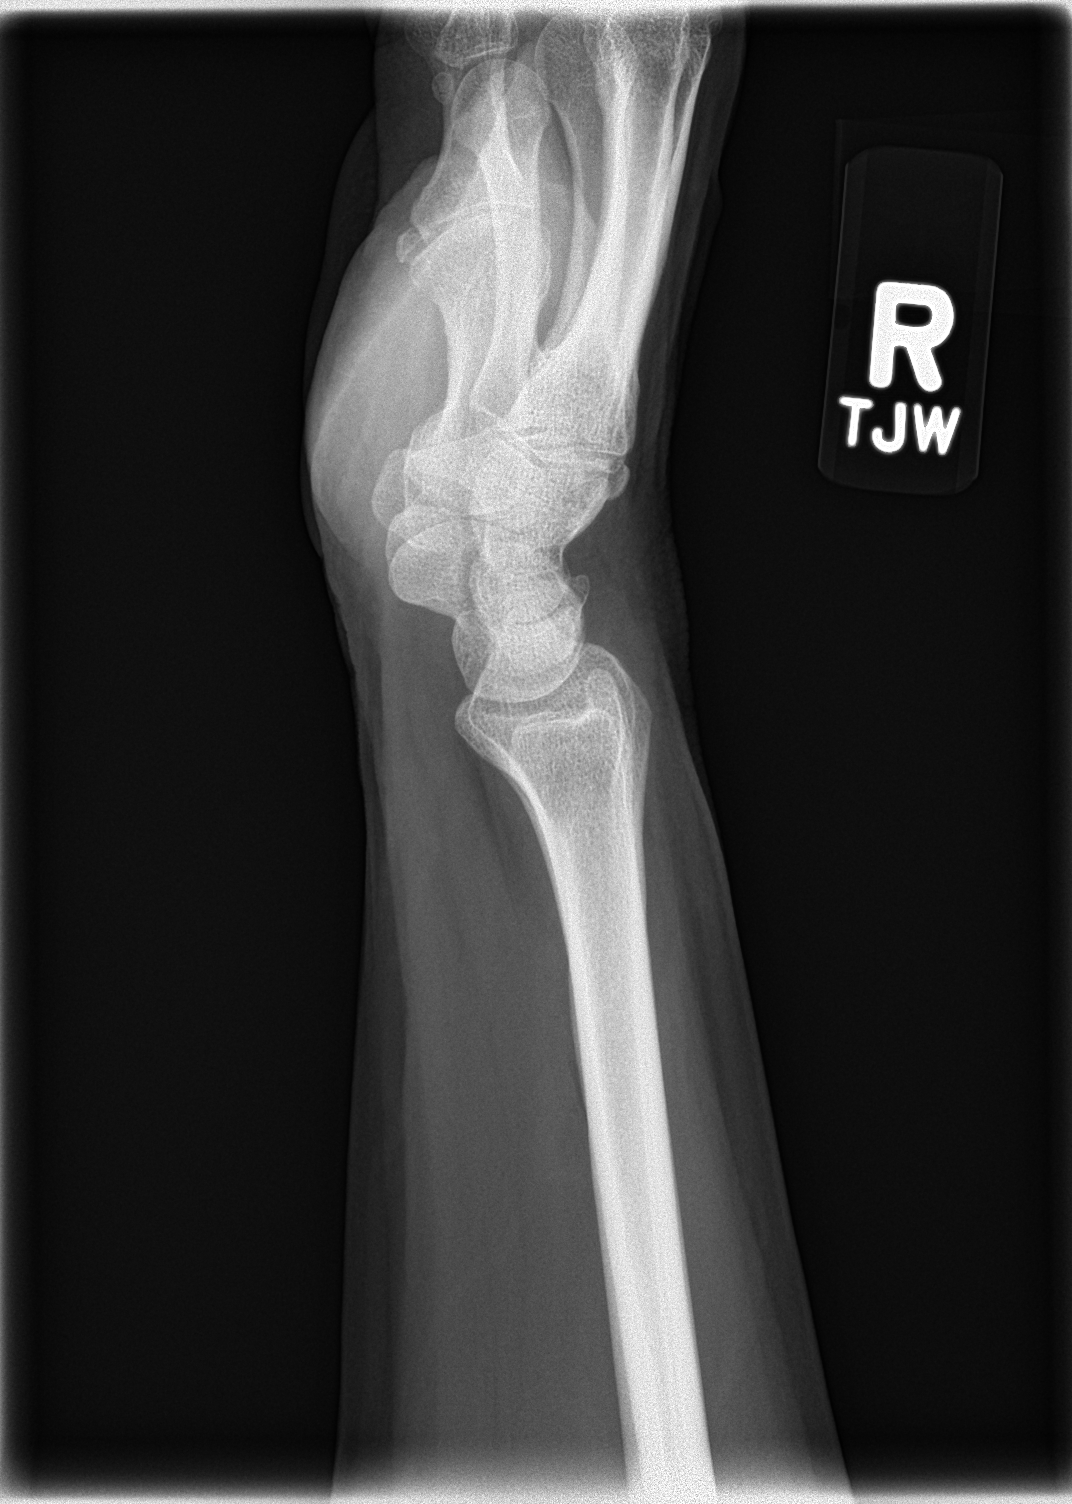

[3 of 3 positions shown; findings below may reference images not displayed]

FINDINGS: Lucency is noted along the distal portion of the ulnar epiphysis.
This most likely a vascular channel. No displaced fracture noted.
Follow-up imaging in 7-10 days can be obtained further evaluation is
needed. Radius is intact. Mild degenerative change.
IMPRESSION: Lucency is noted coursing across the distal portion of the epiphysis
of the right ulna. This is most likely a vascular channel. A
nondisplaced subtle fracture cannot be entirely excluded. Follow-up
imaging in in 7-10 days can be obtained if need be. No other acute
abnormality identified.

## 2016-01-06 IMAGING — DX DG ANKLE COMPLETE 3+V*L*
3 series · 3 of 3 positions shown · non-contrast
Comparison: None.

CLINICAL DATA: left ankle pain, trauma

EXAM:
LEFT ANKLE COMPLETE - 3+ VIEW

[ankle ap]
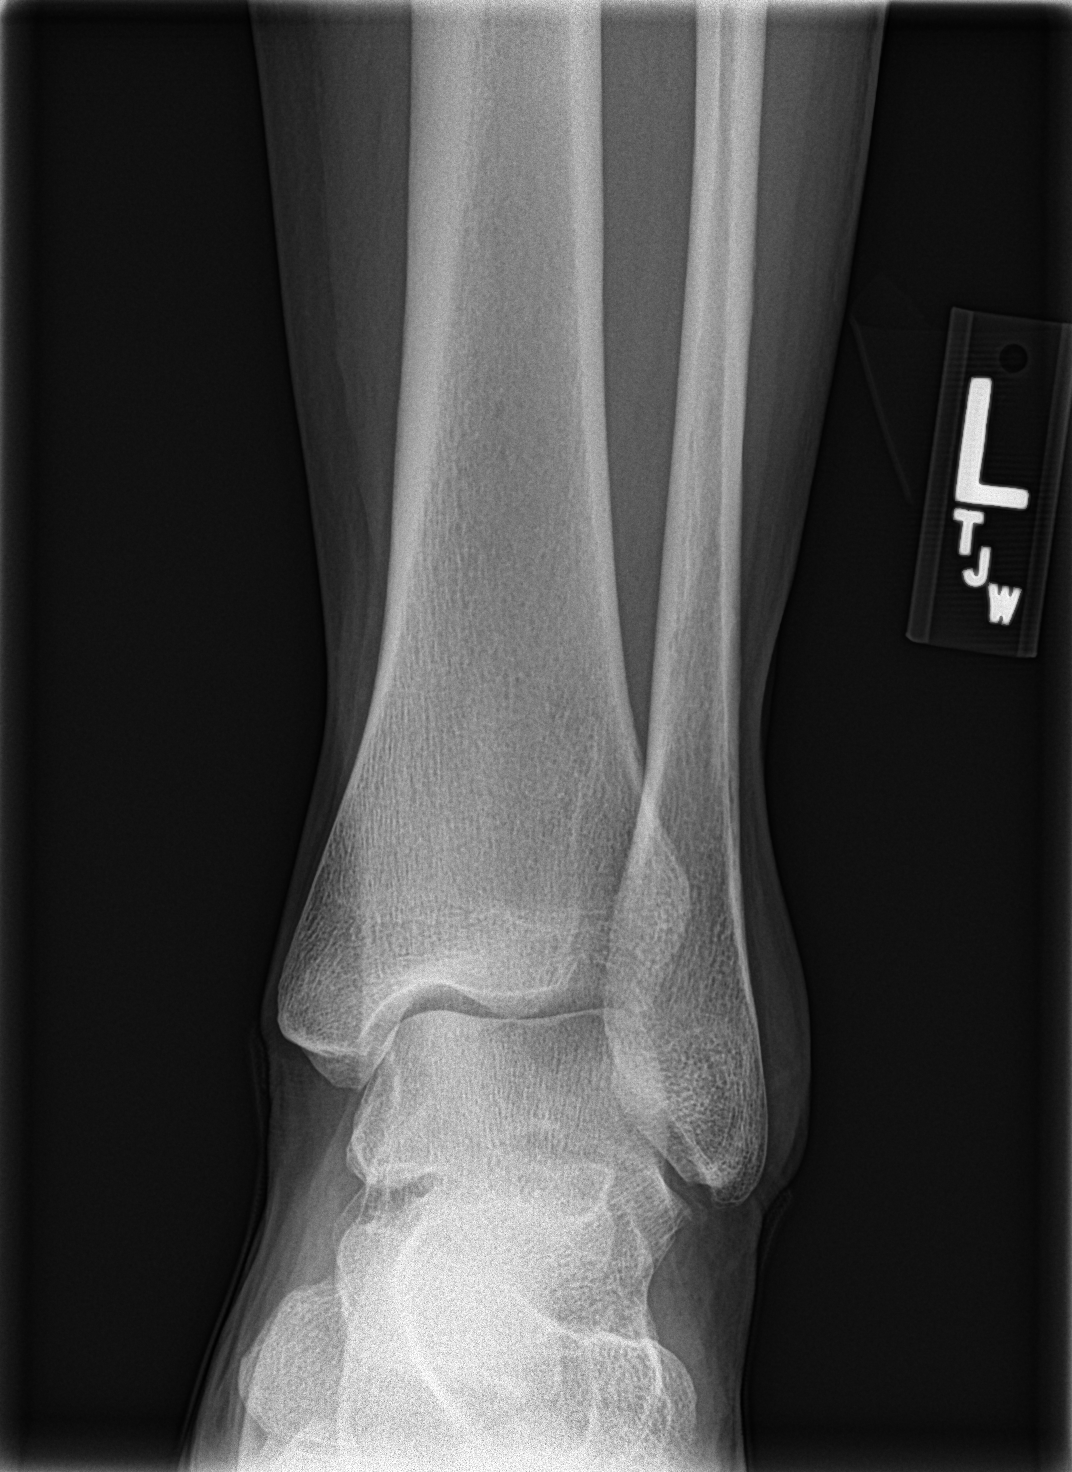

[ankle obl]
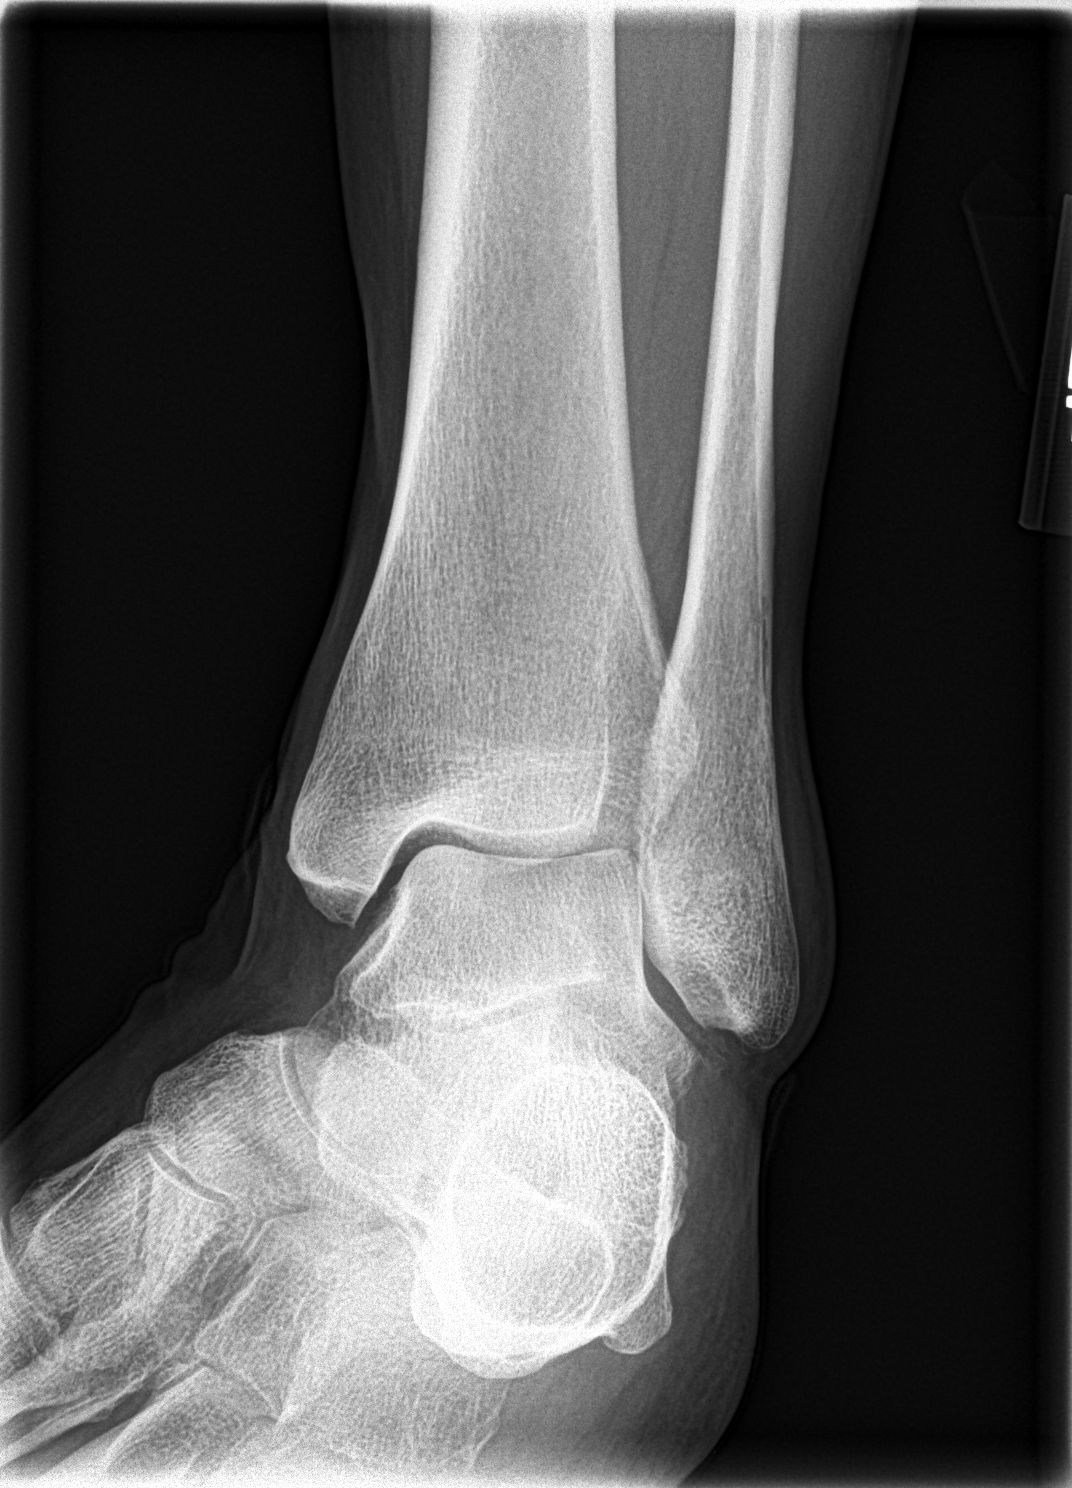

[ankle lat]
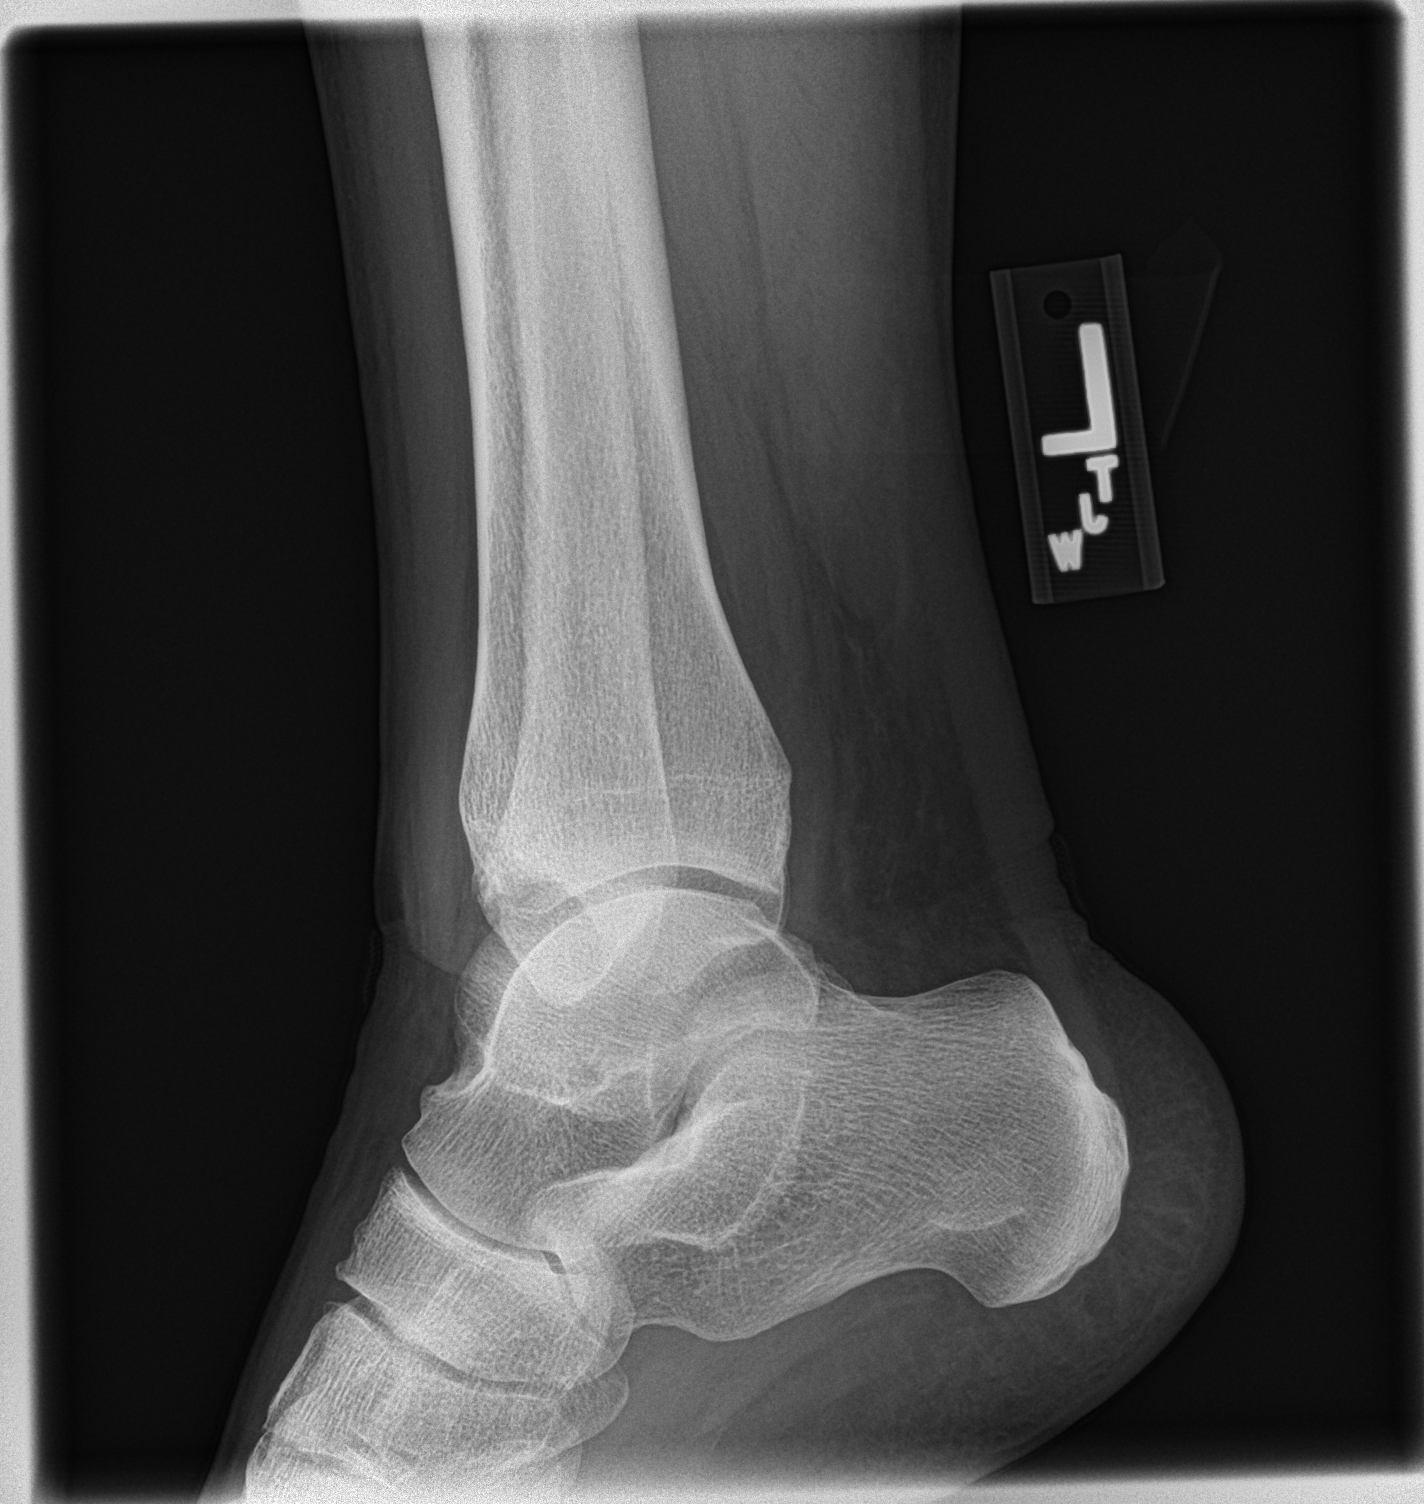

[3 of 3 positions shown; findings below may reference images not displayed]

FINDINGS: Three views of the left ankle submitted. No acute fracture or
subluxation. Mild lateral soft tissue swelling. Ankle mortise is
preserved.
IMPRESSION: No acute fracture or subluxation. Mild lateral soft tissue swelling.
Ankle mortise is preserved.

## 2016-01-06 NOTE — Progress Notes (Addendum)
Dr. Frederico Hamman T. Lashuna Tamashiro, MD, [facility] [place], [address] Phone: [phone] Fax: [phone]  01/06/2016  Patient: Emily Moon, MRN: [id number], DOB: [birth year], 55 y.o.  Primary Physician:  Sheral Flow, NP   Chief Complaint  Patient presents with  . Fall    Slipped and fell in garage this morning   Subjective:   Emily Moon is a 55 y.o. very pleasant female patient who presents with the following:  Patient known well who slipped today in the garage and fell.  She "hurts everywhere ".  Primary areas of concern and pain of the left ankle which has some lateral swelling and additionally she has pain in her right wrist.  Wrist no swelling or bruising here.  She also hit her chin and and has some bruising there.  She has a headache somewhat.  She is otherwise thinking completely clearly.  She also banged both of her knees  Past Medical History, Surgical History, Social History, Family History, Problem List, Medications, and Allergies have been reviewed and updated if relevant.  Patient Active Problem List   Diagnosis Date Noted  . Bipolar disorder ([place]) 05/21/2014    Priority: High  . MULTIPLE SCLEROSIS, PROGRESSIVE/RELAPSING 08/28/2010    Priority: High  . Chronic pain syndrome 01/07/2011    Priority: Medium  . Urinary retention 11/22/2015  . Medicare annual wellness visit, subsequent 10/25/2015  . Back pain 10/31/2014  . 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 Insomnia 09/01/2011  . Obesity 05/04/2011  . Perimenopausal vasomotor symptoms 02/05/2011  . HLD (hyperlipidemia) 01/20/2011  . SMOKER 01/20/2011  . RENAL CALCULUS, RECURRENT 11/13/2010  . GERD 08/28/2010  . Osteoarthritis 08/28/2010  . NEPHROLITHIASIS, HX OF 08/28/2010    Past Medical History  Diagnosis Date  .  Bipolar disorder ([place]) 05/21/14  . Multiple sclerosis ([place])   . Depression   . GERD (gastroesophageal reflux disease)   . Nephrolithiasis   . Osteoporosis     osteoarthritis  . Fibromyalgia syndrome   . 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 Sleep apnea   . Arthritis   . PONV (postoperative nausea and vomiting)     no problem after cataract surgery  . Heart palpitations   . Edema     feet/legs  . Renal stone   . Urinary frequency   . Cataracts, bilateral   . Psoriasis   . Fungal infection     Finger nails    Past Surgical History  Procedure Laterality Date  . Tonsillectomy    . Joint replacement Left 2013    hip replacement  . Lithotripsy    . Cataract extraction w/phaco Left 05/21/2015    Procedure: CATARACT EXTRACTION PHACO AND INTRAOCULAR LENS PLACEMENT (IOC);  Surgeon: Birder Robson, MD;  Location: [facility] ORS;  Service: Ophthalmology;  Laterality: Left;  [place] 00:35 AP% 22.9 CDE 8.11 fluid pack lot [id number]  . Foot surgery    . Thumb arthroscopy    . Eye surgery      tissue biopsy  . Cataract extraction w/phaco Right 06/04/2015    Procedure: CATARACT EXTRACTION PHACO AND INTRAOCULAR LENS PLACEMENT (IOC);  Surgeon: Birder Robson, MD;  Location: [facility] ORS;  Service: Ophthalmology;  Laterality: Right;  US:00:48 AP%: 10.5 CDE:5.08 Fluid lot [id number]    Social History   Social History  . Marital Status: Divorced    Spouse  Name: N/A  . Number of Children: 1  . Years of Education: N/A   Occupational History  . 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 Smoking status: Former Smoker -- 0.50 packs/day for 29 years    Types: Cigarettes  . Smokeless tobacco: Never Used     Comment: recenty 04/26/2014  . Alcohol Use: No  . Drug Use: No  . Sexual Activity: No   Other Topics Concern  . Not on file   Social History Narrative    Family History  Problem Relation Age of Onset  . Cancer Father     Allergies  Allergen  Reactions  . Tylenol [Acetaminophen] Shortness Of Breath and Swelling  . Ultram [Tramadol Hcl]   . Albuterol Other (See Comments)    Makes pt feel hyped up.  . Aripiprazole   . Cefaclor   . Diclofenac Sodium   . Ibuprofen   . Levaquin [Levofloxacin In D5w] Other (See Comments)    Shoulder pain  . Naproxen Sodium   . Sulfa Antibiotics   . Voltaren [Diclofenac]     Medication list reviewed and updated in full in [place].  GEN: No fevers, chills. Nontoxic. Primarily MSK c/o today. MSK: Detailed in the HPI GI: tolerating PO intake without difficulty Neuro: No numbness, parasthesias, or tingling associated. Otherwise the pertinent positives of the ROS are noted above.   Objective:   BP 106/60 mmHg  Pulse 100  Temp(Src) 98.9 F (37.2 C) (Oral)  Ht 5\' 9"  (1.753 m)  Wt 217 lb 8 oz (98.657 kg)  BMI 32.10 kg/m2  LMP 08/23/2014   GEN: WDWN, NAD, Non-toxic, Alert & Oriented x 3 HEENT: Atraumatic, Normocephalic.  Ears and Nose: No external deformity. EXTR: No clubbing/cyanosis/edema NEURO: Normal gait.  PSYCH: Normally interactive. Conversant. Not depressed or anxious appearing.  Calm demeanor.    Bilaterally knees moved normally with full extension and flexion to 120.  Minimal medial joint line tenderness on either side.  MCL, LCL, ACL, and PCL are stable with negative McMurray's.  Left ankle is grossly nontender at the malleoli, talus, navicular, cuboid, and all metatarsals.  Patient does have a positive anterior drawer test with increased laxity.  There is some lateral swelling.  Right foot and ankle are grossly unremarkable.  Right wrist: There is no swelling.  Nontender throughout all fingers and metacarpals.  The wrist has full movement with extension, flexion, radial and ulnar deviation.  There is no tenderness or minimal tenderness in the carpal bones.  Scaphoid is nontender.  Mildly tender distally at the radius and ulna.  Radiology: Dg Wrist Complete  Right  01/07/2016  CLINICAL DATA:  Trauma.  Fall. EXAM: RIGHT WRIST - COMPLETE 3+ VIEW COMPARISON:  12/27/2013. FINDINGS: Almyra Brace is noted along the distal portion of the ulnar epiphysis. This most likely a vascular channel. No displaced fracture noted. Follow-up imaging in 7-10 days can be obtained further evaluation is needed. Radius is intact. Mild degenerative change. IMPRESSION: Lucency is noted coursing across the distal portion of the epiphysis of the right ulna. This is most likely a vascular channel. A nondisplaced subtle fracture cannot be entirely excluded. Follow-up imaging in in 7-10 days can be obtained if need be. No other acute abnormality identified. Electronically Signed   By: Marcello Moores  Register   On: 01/07/2016 08:09   Dg Ankle Complete Left  01/07/2016  CLINICAL DATA:  left ankle pain, trauma EXAM: LEFT ANKLE COMPLETE - 3+ VIEW  COMPARISON:  None. FINDINGS: Three views of the left ankle submitted. No acute fracture or subluxation. Mild lateral soft tissue swelling. Ankle mortise is preserved. IMPRESSION: No acute fracture or subluxation. Mild lateral soft tissue swelling. Ankle mortise is preserved. Electronically Signed   By: Lahoma Crocker M.D.   On: 01/07/2016 08:06     Assessment and Plan:   Right wrist pain - Plan: DG Wrist Complete Right  Left wrist pain  Left ankle pain - Plan: DG Ankle Complete Left  Fall at home, initial encounter  Bilateral knee pain  Generally, think this is all soft tissue.  Radiographs are reviewed independently.  Agree with radiology that a distal ulna fracture cannot be excluded, but more likely represents a vascular channel.  I have attempted to contact the patient regarding this unsuccessfully.  We'll continue to try to contact her.  I'm going to go ahead and have her follow up with me when she gets back in town to recheck.  Addendum: 01/08/2016 Reexamined patient, and clinically convinced this is a distal ulnar fx based on exam and films.  Placed in a short armed EXOS fracture brace.   Electronically Signed  By: Owens Loffler, MD On: 01/08/2016 4:50 PM   Follow-up: if pain persists when back from [place] This Encounter  Procedures  . DG Wrist Complete Right  . DG Ankle Complete Left    Signed,  Daven Pinckney T. Laquenta Whitsell, MD   Patient's Medications  New Prescriptions   No medications on file  Previous Medications   ASPERCREME LIDOCAINE EX    Apply topically.   ASPIRIN 325 MG TABLET    Take 325 mg by mouth as needed.   BIOTIN PO    Take 5,000 mg by mouth daily.    CALCIPOTRIENE-BETAMETHASONE (TACLONEX) OINTMENT    Apply topically daily.   CALCIUM CARBONATE (TUMS - DOSED IN MG ELEMENTAL CALCIUM) 500 MG CHEWABLE TABLET    Chew 2 tablets by mouth daily.    CETIRIZINE (ZYRTEC) 10 MG TABLET    Take 10 mg by mouth daily.   CHOLECALCIFEROL (VITAMIN D-3 PO)    Take 10,000 Units by mouth daily.    CICLOPIROX (PENLAC) 8 % SOLUTION    Apply nightly over nail and surrounding skin. Apply daily over prior coat. After 7 days, may remove with alcohol and continue   CLOBETASOL CREAM (TEMOVATE) 0.05 %    Apply 1 application topically 2 (two) times daily.   DICLOFENAC SODIUM (VOLTAREN) 1 % GEL    Apply 4 g topically 4 (four) times daily.   DULOXETINE (CYMBALTA) 20 MG CAPSULE    Take 40 mg by mouth daily.   FLUTICASONE (FLONASE) 50 MCG/ACT NASAL SPRAY    Place 1 spray into both nostrils daily.   GARCINIA CAMBOGIA-CHROMIUM 500-200 MG-MCG TABS    Take 1 tablet by mouth 2 (two) times daily. Reported on 11/07/2015   HYDROCODONE-ACETAMINOPHEN (NORCO) 10-325 MG TABLET    Take 1 tablet by mouth every 6 (six) hours as needed.   HYDROQUINONE 4 % CREAM    APPLY TO AFFECTED AREAS ON FACE EVERY DAY AT BEDTIME   HYDROXYZINE (VISTARIL) 50 MG CAPSULE    Take 100 mg by mouth at bedtime.   LAMOTRIGINE (LAMICTAL) 150 MG TABLET    Take two tablets by mouth daily   LEVALBUTEROL (XOPENEX HFA) 45 MCG/ACT INHALER    Inhale 1-2 puffs into the lungs  every 6 (six) hours as needed for wheezing.   LYSINE 1000 MG TABS  Take 2,000 mg by mouth daily.    METAXALONE (SKELAXIN) 800 MG TABLET    Take 1 tablet (800 mg total) by mouth 3 (three) times daily.   NICOTINE (NICODERM CQ - DOSED IN MG/24 HOURS) 21 MG/24HR PATCH    Place 21 mg onto the skin daily.   OMEGA-3 FATTY ACIDS (FISH OIL PO)    Take 2 capsules by mouth at bedtime.   PHENAZOPYRIDINE HCL (AZO TABS PO)    Take 2 tablets by mouth as needed.    POLYETHYLENE GLYCOL POWDER (GLYCOLAX/MIRALAX) POWDER    MIX 17 GRAMS (1 CAPFUL) WITH 4-8 OZ OF LIQUID AND TAKE BY MOUTH TWICE DAILY AS NEEDED   PROBIOTIC PRODUCT (PROBIOTIC PO)    Take 1 tablet by mouth daily.   RANITIDINE (ZANTAC) 150 MG TABLET    Take 150 mg by mouth 2 (two) times daily as needed for heartburn.   TAMSULOSIN (FLOMAX) 0.4 MG CAPS CAPSULE    Take 1 capsule (0.4 mg total) by mouth daily.   TEMAZEPAM (RESTORIL) 30 MG CAPSULE    TAKE ONE TO TWO CAPSULES BY MOUTH NIGHTLY AT BEDTIME AS NEEDED    TIZANIDINE (ZANAFLEX) 4 MG TABLET    Take 2 tablets (8 mg total) by mouth 3 (three) times daily.  Modified Medications   No medications on file  Discontinued Medications   No medications on file

## 2016-01-06 NOTE — Progress Notes (Signed)
Pre visit review using our clinic review tool, if applicable. No additional management support is needed unless otherwise documented below in the visit note. 

## 2016-01-08 ENCOUNTER — Telehealth: Payer: Self-pay | Admitting: Family Medicine

## 2016-01-08 DIAGNOSIS — M25531 Pain in right wrist: Secondary | ICD-10-CM | POA: Diagnosis not present

## 2016-01-08 NOTE — Telephone Encounter (Signed)
Patient returned Dr. Coplands call. °

## 2016-01-08 NOTE — Telephone Encounter (Signed)
We discussed  

## 2016-01-09 ENCOUNTER — Telehealth: Payer: Self-pay | Admitting: Family Medicine

## 2016-01-09 NOTE — Telephone Encounter (Signed)
i spoke to Emily Moon. She tried to loosen it at dial which did not help. The issues sounds like when she felt it was too loose yesterday, I molded around wrist after reheating it again and is now constricting.   I would reheat maybe 1 min to 1:30 min based on how it feels, tighten knob without extensive compression or molding at the wrist.   Should work fine.   If not, then give forearm splint or thumb spica splint, which is acceptable.  She is coming at 7:50 AM tomorrow.

## 2016-01-09 NOTE — Telephone Encounter (Signed)
Patient had a removable cast put on her wrist yesterday.  Patient said the cast is digging in to her wrist.  Patient said it's unbearable.  Patient said she wants to know what plan b is? Patient wants to know if she can get a refund on the cast.

## 2016-01-10 NOTE — Telephone Encounter (Signed)
Emily Moon came in to remold her wrist brace and which time she stated she wishes to try a different brace and return the other.  I fitted Emily Moon with a right thumb spica brace which she liked much better.  Emily Moon will follow up on 01/27/2016 with Dr. Lorelei Pont as scheduled.

## 2016-01-21 DIAGNOSIS — F3162 Bipolar disorder, current episode mixed, moderate: Secondary | ICD-10-CM | POA: Diagnosis not present

## 2016-01-27 ENCOUNTER — Ambulatory Visit (INDEPENDENT_AMBULATORY_CARE_PROVIDER_SITE_OTHER): Payer: PPO | Admitting: Family Medicine

## 2016-01-27 ENCOUNTER — Encounter: Payer: Self-pay | Admitting: Family Medicine

## 2016-01-27 ENCOUNTER — Ambulatory Visit (INDEPENDENT_AMBULATORY_CARE_PROVIDER_SITE_OTHER)
Admission: RE | Admit: 2016-01-27 | Discharge: 2016-01-27 | Disposition: A | Discharge status: Home / Self Care | Payer: PPO | Source: Ambulatory Visit | Attending: Family Medicine | Admitting: Family Medicine

## 2016-01-27 VITALS — BP 110/72 | HR 103 | Temp 98.8°F | Ht 69.0 in | Wt 218.8 lb

## 2016-01-27 DIAGNOSIS — S62101A Fracture of unspecified carpal bone, right wrist, initial encounter for closed fracture: Secondary | ICD-10-CM | POA: Diagnosis not present

## 2016-01-27 DIAGNOSIS — M1990 Unspecified osteoarthritis, unspecified site: Secondary | ICD-10-CM

## 2016-01-27 DIAGNOSIS — S62101D Fracture of unspecified carpal bone, right wrist, subsequent encounter for fracture with routine healing: Secondary | ICD-10-CM | POA: Diagnosis not present

## 2016-01-27 DIAGNOSIS — M25531 Pain in right wrist: Secondary | ICD-10-CM

## 2016-01-27 DIAGNOSIS — Z961 Presence of intraocular lens: Secondary | ICD-10-CM | POA: Diagnosis not present

## 2016-01-27 IMAGING — DX DG WRIST COMPLETE 3+V*R*
4 series · 4 of 4 positions shown · non-contrast
Comparison: [DATE]; [DATE]

CLINICAL DATA: Follow-up of right wrist fracture

EXAM:
RIGHT WRIST - COMPLETE 3+ VIEW

[wrist obl]
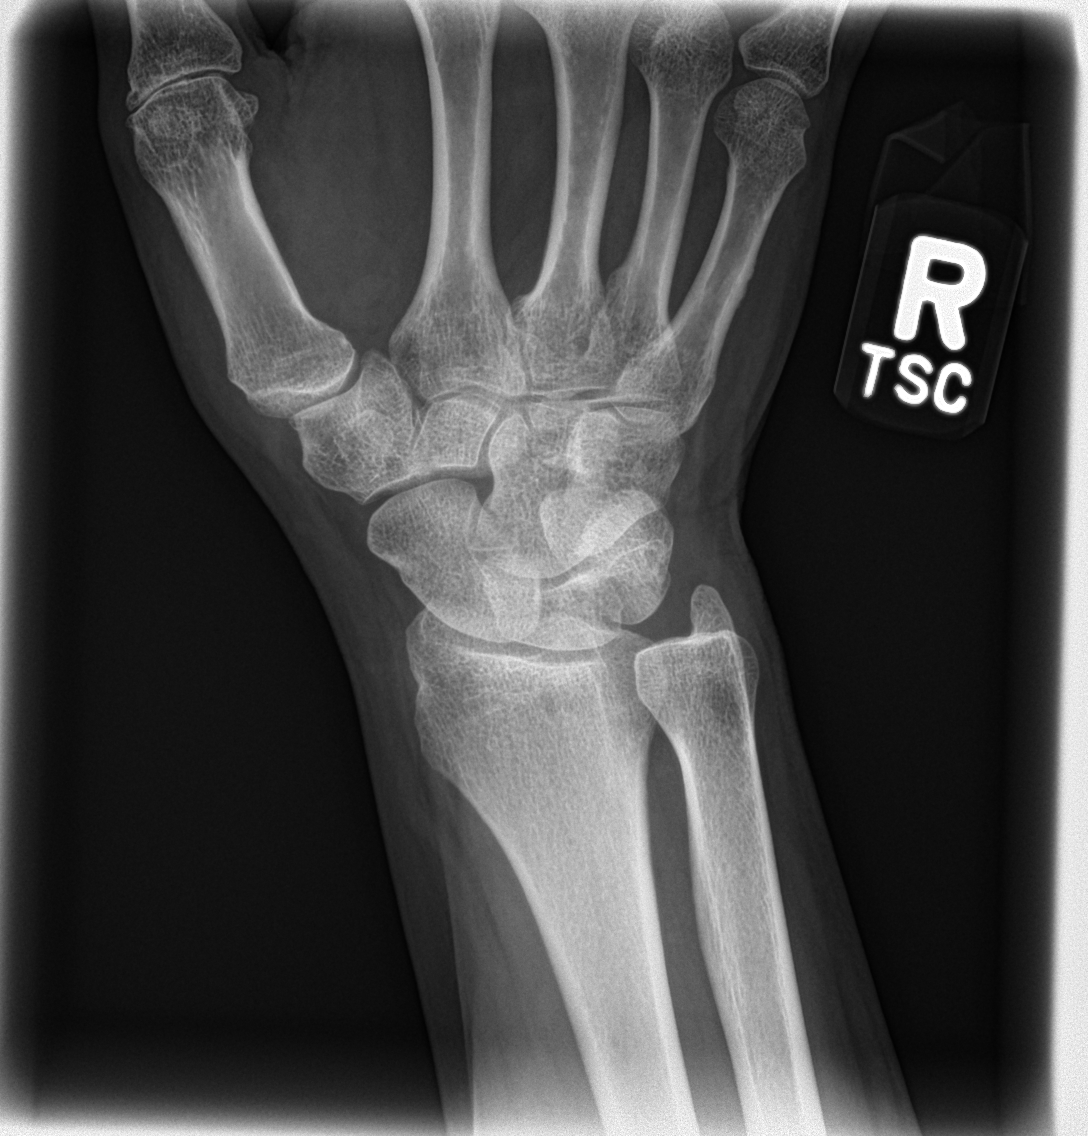

[wrist pa (1 of 2)]
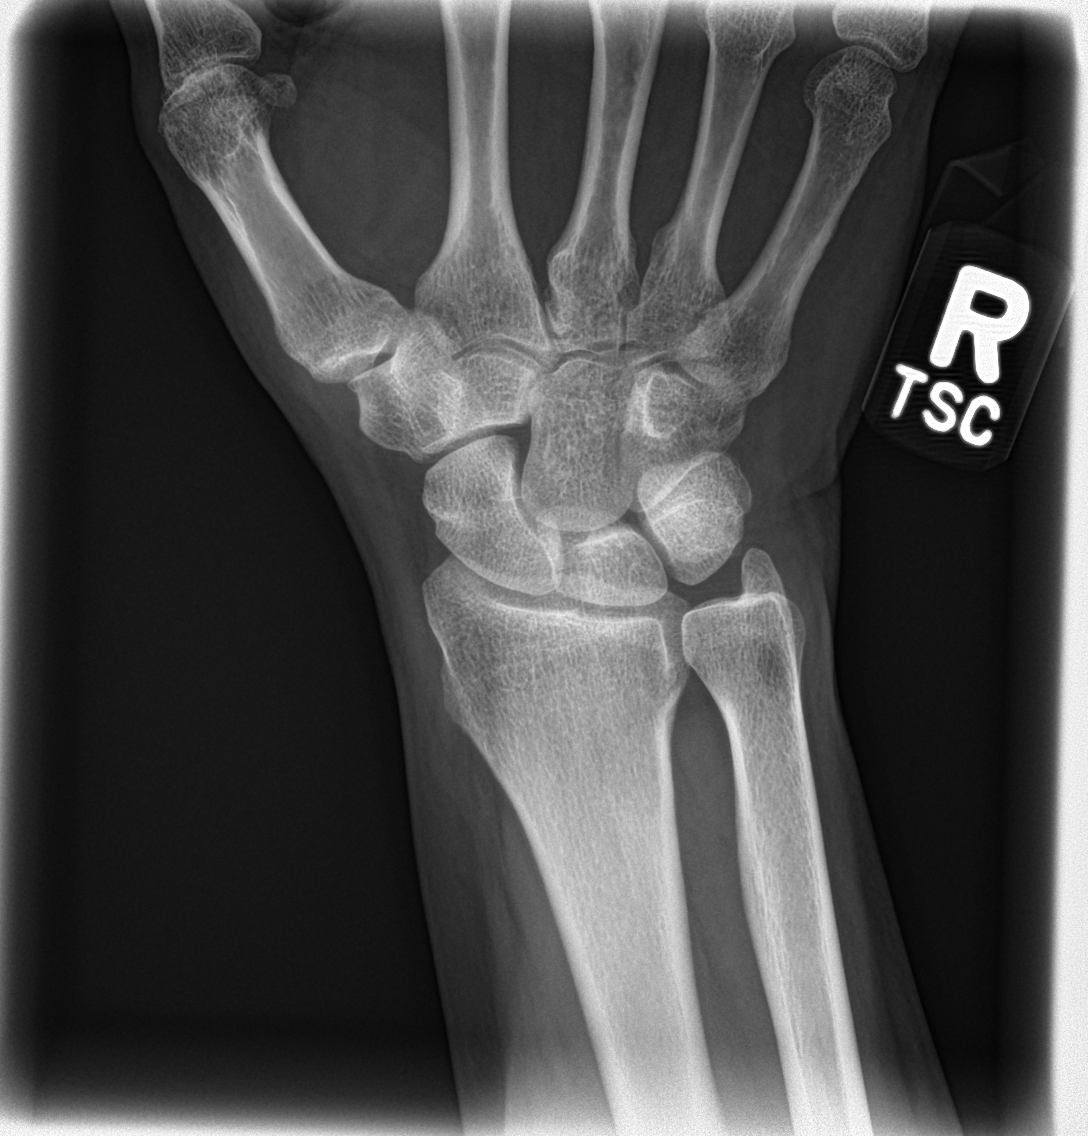

[wrist pa (2 of 2)]
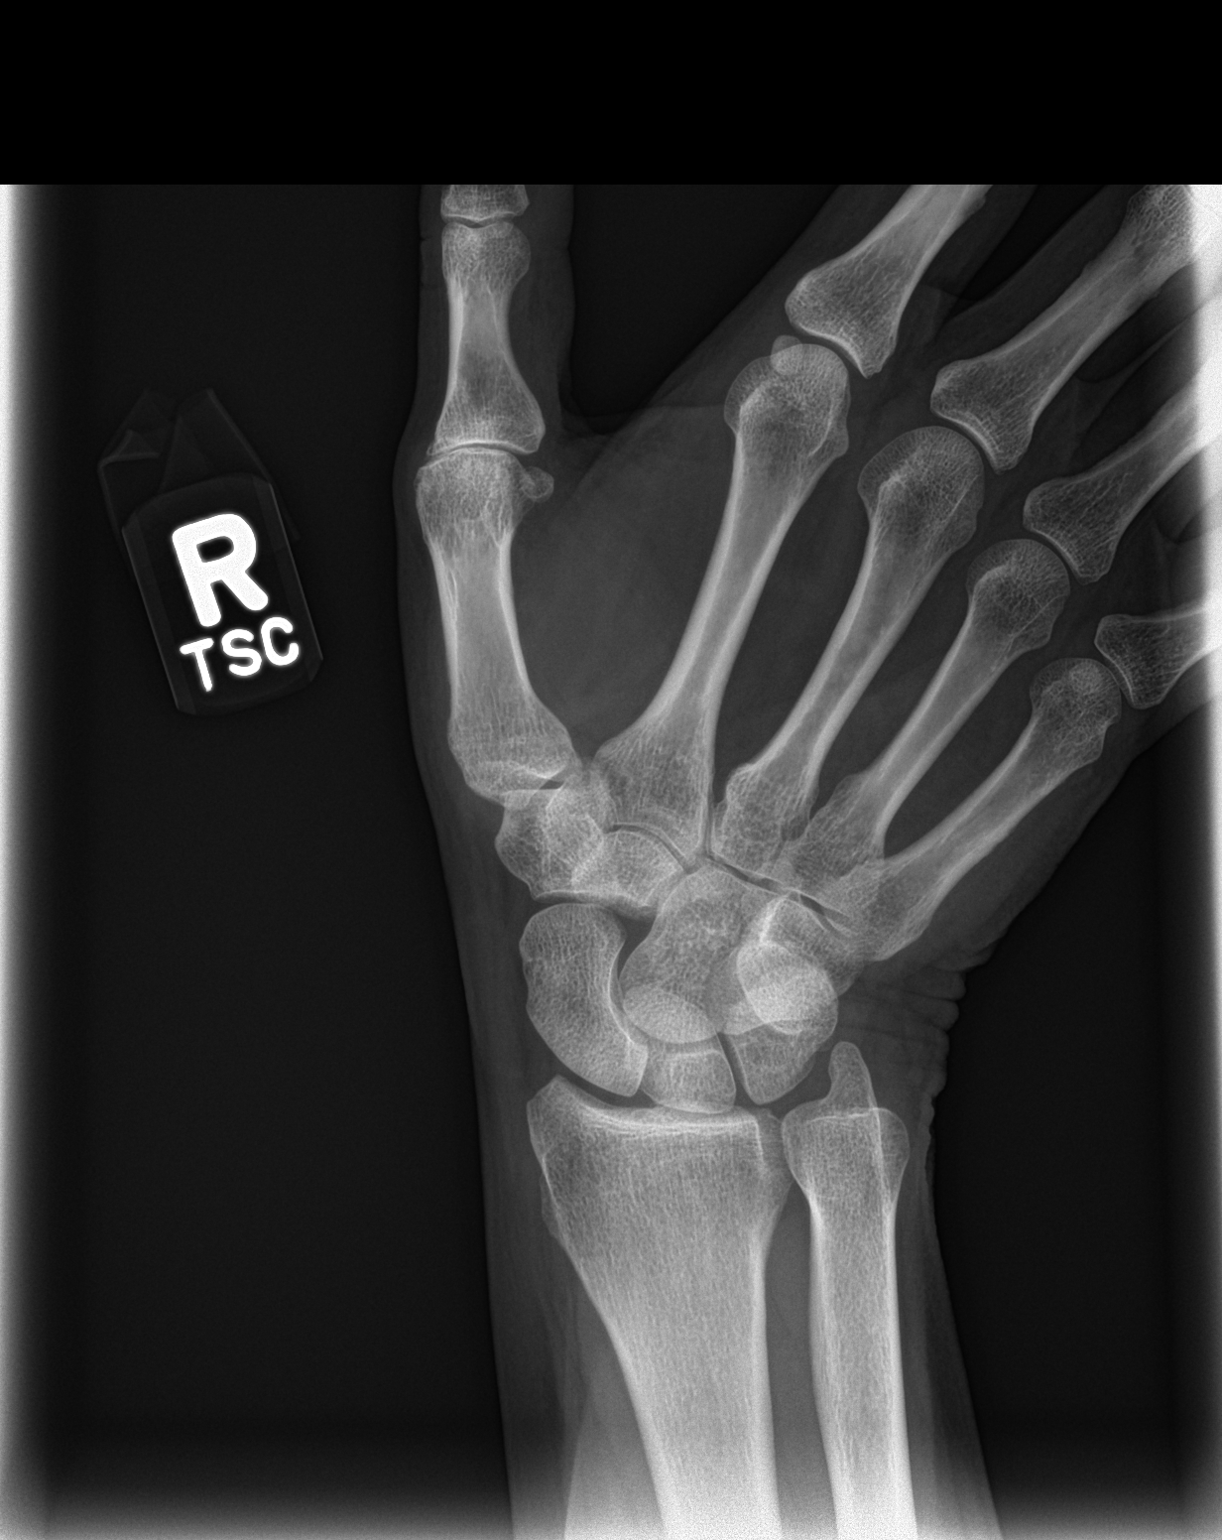

[wrist lat]
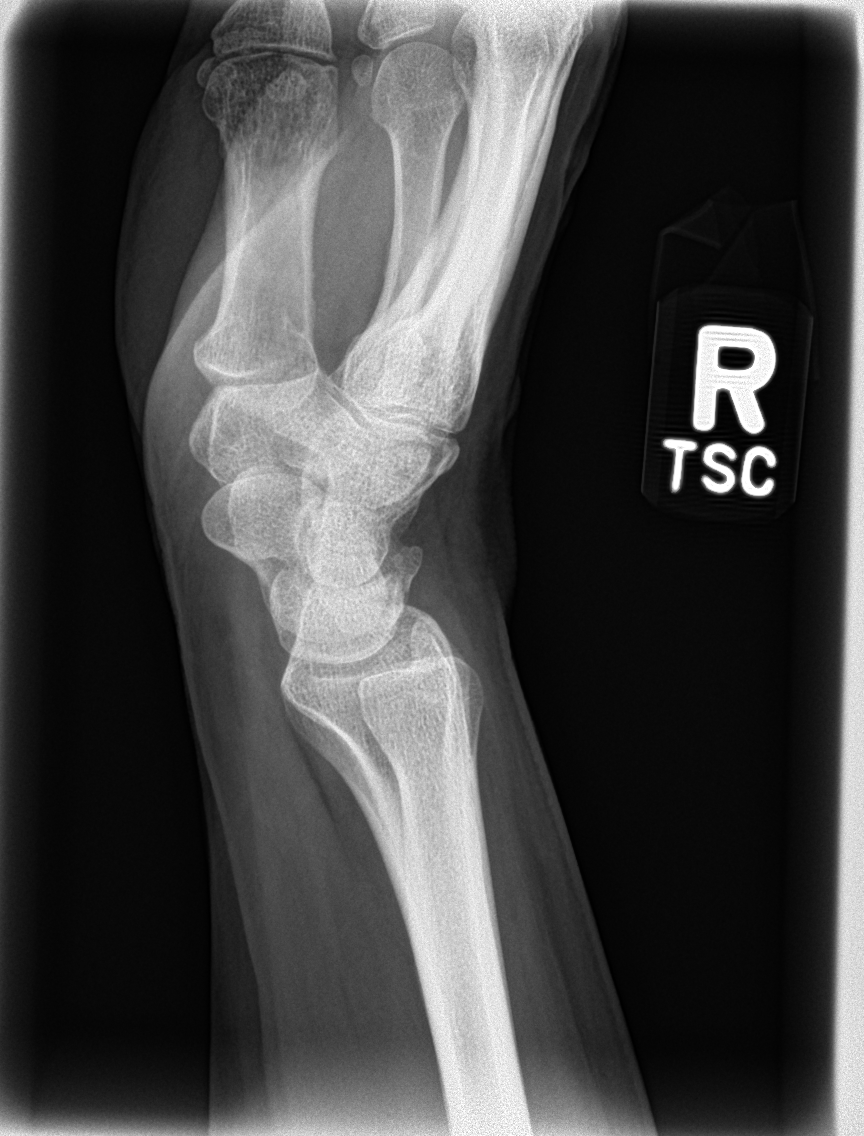

[4 of 4 positions shown; findings below may reference images not displayed]

FINDINGS: No evidence of acute or chronic fracture with special attention paid
to the distal aspect of the right ulna. Joint spaces are preserved.
No erosions. No evidence of chondrocalcinosis. Regional soft tissues
appear normal. No displacement of the pronator quadratus fat pad.
IMPRESSION: No evidence of acute or chronic fracture involving the right wrist
with special attention paid to the distal ulna.

## 2016-01-27 MED ORDER — HYDROCODONE-ACETAMINOPHEN 10-325 MG PO TABS: 1.0000 | ORAL_TABLET | Freq: Four times a day (QID) | ORAL | Status: DC | PRN

## 2016-01-27 NOTE — Progress Notes (Signed)
Pre visit review using our clinic review tool, if applicable. No additional management support is needed unless otherwise documented below in the visit note. 

## 2016-01-27 NOTE — Progress Notes (Signed)
Dr. Frederico Hamman T. Ammon Muscatello, MD, [facility] [place], [address] Phone: [phone] Fax: [phone]  01/27/2016  Patient: Emily Moon, MRN: [id number], DOB: [birth year], 55 y.o.  Primary Physician:  Sheral Flow, NP   Chief Complaint  Patient presents with  . Follow-up    Right Wrist   Subjective:   Emily Moon is a 55 y.o. very pleasant female patient who presents with the following:  R wrist - doing much better. She has been immobilized over the course of the last 3 weeks for ? Of distal ulna fx. Could not tolerate an EXOS fracture brace despite multiple molds, so we placed her in a thumb spica splint. Now, R wrist feels fine.  01/09/2016 Last OV with Emily Loffler, MD  Patient known well who slipped today in the garage and fell.  She "hurts everywhere ".  Primary areas of concern and pain of the left ankle which has some lateral swelling and additionally she has pain in her right wrist.  Wrist no swelling or bruising here.  She also hit her chin and and has some bruising there.  She has a headache somewhat.  She is otherwise thinking completely clearly.  She also banged both of her knees  Past Medical History, Surgical History, Social History, Family History, Problem List, Medications, and Allergies have been reviewed and updated if relevant.  Patient Active Problem List   Diagnosis Date Noted  . Bipolar disorder ([place]) 05/21/2014    Priority: High  . MULTIPLE SCLEROSIS, PROGRESSIVE/RELAPSING 08/28/2010    Priority: High  . Chronic pain syndrome 01/07/2011    Priority: Medium  . Back pain 10/31/2014  . 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 Insomnia 09/01/2011  . Obesity 05/04/2011  . Perimenopausal vasomotor symptoms 02/05/2011  . HLD (hyperlipidemia) 01/20/2011  . SMOKER 01/20/2011    . RENAL CALCULUS, RECURRENT 11/13/2010  . GERD 08/28/2010  . Osteoarthritis 08/28/2010  . NEPHROLITHIASIS, HX OF 08/28/2010    Past Medical History  Diagnosis Date  . Bipolar disorder ([place]) 05/21/14  . Multiple sclerosis ([place])   . Depression   . GERD (gastroesophageal reflux disease)   . Nephrolithiasis   . Osteoporosis     osteoarthritis  . Fibromyalgia syndrome   . 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 Sleep apnea   . Arthritis   . PONV (postoperative nausea and vomiting)     no problem after cataract surgery  . Heart palpitations   . Edema     feet/legs  . Renal stone   . Urinary frequency   . Cataracts, bilateral   . Psoriasis   . Fungal infection     Finger nails    Past Surgical History  Procedure Laterality Date  . Tonsillectomy    . Joint replacement Left 2013    hip replacement  . Lithotripsy    . Cataract extraction w/phaco Left 05/21/2015    Procedure: CATARACT EXTRACTION PHACO AND INTRAOCULAR LENS PLACEMENT (IOC);  Surgeon: Birder Robson, MD;  Location: [facility] ORS;  Service: Ophthalmology;  Laterality: Left;  [place] 00:35 AP% 22.9 CDE 8.11 fluid pack lot [id number]  . Foot surgery    . Thumb arthroscopy    . Eye surgery      tissue biopsy  . Cataract extraction w/phaco Right 06/04/2015    Procedure: CATARACT EXTRACTION PHACO AND INTRAOCULAR LENS PLACEMENT (IOC);  Surgeon: Birder Robson, MD;  Location: [facility] ORS;  Service: Ophthalmology;  Laterality: Right;  US:00:48 AP%: 10.5 CDE:5.08 Fluid lot [id number]    Social History   Social History  . Marital Status: Divorced    Spouse Name: N/A  . Number of Children: 1  . Years of Education: N/A   Occupational History  . 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 Smoking status: Former Smoker -- 0.50 packs/day for 29 years    Types: Cigarettes  . Smokeless tobacco: Never Used     Comment: recenty 04/26/2014  . Alcohol Use: No  . Drug Use: No  . Sexual Activity: No    Other Topics Concern  . Not on file   Social History Narrative    Family History  Problem Relation Age of Onset  . Cancer Father     Allergies  Allergen Reactions  . Tylenol [Acetaminophen] Shortness Of Breath and Swelling  . Ultram [Tramadol Hcl]   . Albuterol Other (See Comments)    Makes pt feel hyped up.  . Aripiprazole   . Cefaclor   . Diclofenac Sodium   . Ibuprofen   . Levaquin [Levofloxacin In D5w] Other (See Comments)    Shoulder pain  . Naproxen Sodium   . Sulfa Antibiotics   . Voltaren [Diclofenac]     Medication list reviewed and updated in full in [facility].  GEN: No fevers, chills. Nontoxic. Primarily MSK c/o today. MSK: Detailed in the HPI GI: tolerating PO intake without difficulty Neuro: No numbness, parasthesias, or tingling associated. Otherwise the pertinent positives of the ROS are noted above.   Objective:   BP 110/72 mmHg  Pulse 103  Temp(Src) 98.8 F (37.1 C) (Oral)  Ht 5\' 9"  (1.753 m)  Wt 218 lb 12 oz (99.224 kg)  BMI 32.29 kg/m2  LMP 08/23/2014   GEN: WDWN, NAD, Non-toxic, Alert & Oriented x 3 HEENT: Atraumatic, Normocephalic.  Ears and Nose: No external deformity. EXTR: No clubbing/cyanosis/edema NEURO: Normal gait.  PSYCH: Normally interactive. Conversant. Not depressed or anxious appearing.  Calm demeanor.    Right wrist: There is no swelling.  Nontender throughout all fingers and metacarpals.  The wrist has full movement with extension, flexion, radial and ulnar deviation.  There is no tenderness or minimal tenderness in the carpal bones.  Scaphoid is nontender.  Mildly tender distally at the radius and ulna, much improved compared to last exam.  Radiology: Dg Wrist Complete Right  01/27/2016  CLINICAL DATA:  Follow-up of right wrist fracture EXAM: RIGHT WRIST - COMPLETE 3+ VIEW COMPARISON:  01/06/2016; 12/27/2013 FINDINGS: No evidence of acute or chronic fracture with special attention paid to the distal aspect of  the right ulna. Joint spaces are preserved. No erosions. No evidence of chondrocalcinosis. Regional soft tissues appear normal. No displacement of the pronator quadratus fat pad. IMPRESSION: No evidence of acute or chronic fracture involving the right wrist with special attention paid to the distal ulna. Electronically Signed   By: Sandi Mariscal M.D.   On: 01/27/2016 15:20    Assessment and Plan:   Right wrist pain  Right wrist fracture, with routine healing, subsequent encounter - Plan: DG Wrist Complete Right, DG Wrist Complete Right  Osteoarthritis, unspecified osteoarthritis type, unspecified site - Plan: HYDROcodone-acetaminophen (NORCO) 10-325 MG tablet  Agree with radiology - I believe that the prior film shows vasculature and no signs of fx on film today. D/c thumb spica splint.  Reassured.  Follow-up: prn  Modified Medications   Modified Medication Previous Medication   HYDROCODONE-ACETAMINOPHEN (NORCO) 10-325 MG TABLET HYDROcodone-acetaminophen (NORCO) 10-325 MG tablet      Take 1 tablet by mouth every 6 (six) hours as needed.    Take 1 tablet by mouth every 6 (six) hours as needed.   Orders Placed This Encounter  Procedures  . DG Wrist Complete Right    Signed,  Evaline Waltman T. Penn Grissett, MD   Patient's Medications  New Prescriptions   No medications on file  Previous Medications   ASPERCREME LIDOCAINE EX    Apply topically.   ASPIRIN 325 MG TABLET    Take 325 mg by mouth as needed.   BIOTIN PO    Take 5,000 mg by mouth daily.    CALCIPOTRIENE-BETAMETHASONE (TACLONEX) OINTMENT    Apply topically daily.   CALCIUM CARBONATE (TUMS - DOSED IN MG ELEMENTAL CALCIUM) 500 MG CHEWABLE TABLET    Chew 2 tablets by mouth daily.    CETIRIZINE (ZYRTEC) 10 MG TABLET    Take 10 mg by mouth daily.   CHOLECALCIFEROL (VITAMIN D-3 PO)    Take 10,000 Units by mouth daily.    CICLOPIROX (PENLAC) 8 % SOLUTION    Apply nightly over nail and surrounding skin. Apply daily over prior coat. After 7  days, may remove with alcohol and continue   CLOBETASOL CREAM (TEMOVATE) 0.05 %    Apply 1 application topically 2 (two) times daily.   DICLOFENAC SODIUM (VOLTAREN) 1 % GEL    Apply 4 g topically 4 (four) times daily.   DULOXETINE (CYMBALTA) 20 MG CAPSULE    Take 40 mg by mouth daily.   FLUTICASONE (FLONASE) 50 MCG/ACT NASAL SPRAY    Place 1 spray into both nostrils daily.   GARCINIA CAMBOGIA-CHROMIUM 500-200 MG-MCG TABS    Take 1 tablet by mouth 2 (two) times daily. Reported on 11/07/2015   HYDROQUINONE 4 % CREAM    APPLY TO AFFECTED AREAS ON FACE EVERY DAY AT BEDTIME   HYDROXYZINE (VISTARIL) 50 MG CAPSULE    Take 100 mg by mouth at bedtime.   LAMOTRIGINE (LAMICTAL) 150 MG TABLET    Take two tablets by mouth daily   LEVALBUTEROL (XOPENEX HFA) 45 MCG/ACT INHALER    Inhale 1-2 puffs into the lungs every 6 (six) hours as needed for wheezing.   LYSINE 1000 MG TABS    Take 2,000 mg by mouth daily.    METAXALONE (SKELAXIN) 800 MG TABLET    Take 1 tablet (800 mg total) by mouth 3 (three) times daily.   NICOTINE (NICODERM CQ - DOSED IN MG/24 HOURS) 21 MG/24HR PATCH    Place 21 mg onto the skin daily.   OMEGA-3 FATTY ACIDS (FISH OIL PO)    Take 2 capsules by mouth at bedtime.   PHENAZOPYRIDINE HCL (AZO TABS PO)    Take 2 tablets by mouth as needed.    POLYETHYLENE GLYCOL POWDER (GLYCOLAX/MIRALAX) POWDER    MIX 17 GRAMS (1 CAPFUL) WITH 4-8 OZ OF LIQUID AND TAKE BY MOUTH TWICE DAILY AS NEEDED   PROBIOTIC PRODUCT (PROBIOTIC PO)    Take 1 tablet by mouth daily.   RANITIDINE (ZANTAC) 150 MG TABLET    Take 150 mg by mouth 2 (two) times daily as needed for heartburn.   TAMSULOSIN (FLOMAX) 0.4 MG CAPS CAPSULE    Take 1 capsule (0.4 mg total) by mouth daily.   TEMAZEPAM (RESTORIL) 30 MG CAPSULE    TAKE ONE TO TWO CAPSULES  BY MOUTH NIGHTLY AT BEDTIME AS NEEDED    TIZANIDINE (ZANAFLEX) 4 MG TABLET    Take 2 tablets (8 mg total) by mouth 3 (three) times daily.  Modified Medications   Modified Medication Previous  Medication   HYDROCODONE-ACETAMINOPHEN (NORCO) 10-325 MG TABLET HYDROcodone-acetaminophen (NORCO) 10-325 MG tablet      Take 1 tablet by mouth every 6 (six) hours as needed.    Take 1 tablet by mouth every 6 (six) hours as needed.  Discontinued Medications   No medications on file

## 2016-02-10 ENCOUNTER — Ambulatory Visit (INDEPENDENT_AMBULATORY_CARE_PROVIDER_SITE_OTHER)
Admission: RE | Admit: 2016-02-10 | Discharge: 2016-02-10 | Disposition: A | Discharge status: Home / Self Care | Payer: PPO | Source: Ambulatory Visit | Attending: Family Medicine | Admitting: Family Medicine

## 2016-02-10 ENCOUNTER — Encounter: Payer: Self-pay | Admitting: Family Medicine

## 2016-02-10 ENCOUNTER — Ambulatory Visit (INDEPENDENT_AMBULATORY_CARE_PROVIDER_SITE_OTHER): Payer: PPO | Admitting: Family Medicine

## 2016-02-10 VITALS — BP 100/72 | HR 90 | Temp 99.4°F | Ht 69.0 in | Wt 216.2 lb

## 2016-02-10 DIAGNOSIS — Q6589 Other specified congenital deformities of hip: Secondary | ICD-10-CM | POA: Diagnosis not present

## 2016-02-10 DIAGNOSIS — M542 Cervicalgia: Secondary | ICD-10-CM

## 2016-02-10 DIAGNOSIS — M25552 Pain in left hip: Secondary | ICD-10-CM

## 2016-02-10 IMAGING — DX DG HIP (WITH OR WITHOUT PELVIS) 2-3V*L*
2 series · 3 of 3 positions shown · non-contrast
Comparison: [DATE]

CLINICAL DATA: Left hip pain for 2 months, status post fall. Prior
left hip replacement

EXAM:
DG HIP (WITH OR WITHOUT PELVIS) 2-3V LEFT

[Series 1: hip ap · 0.14mm/px · 2 of 2 slices shown]
[im 1/2]
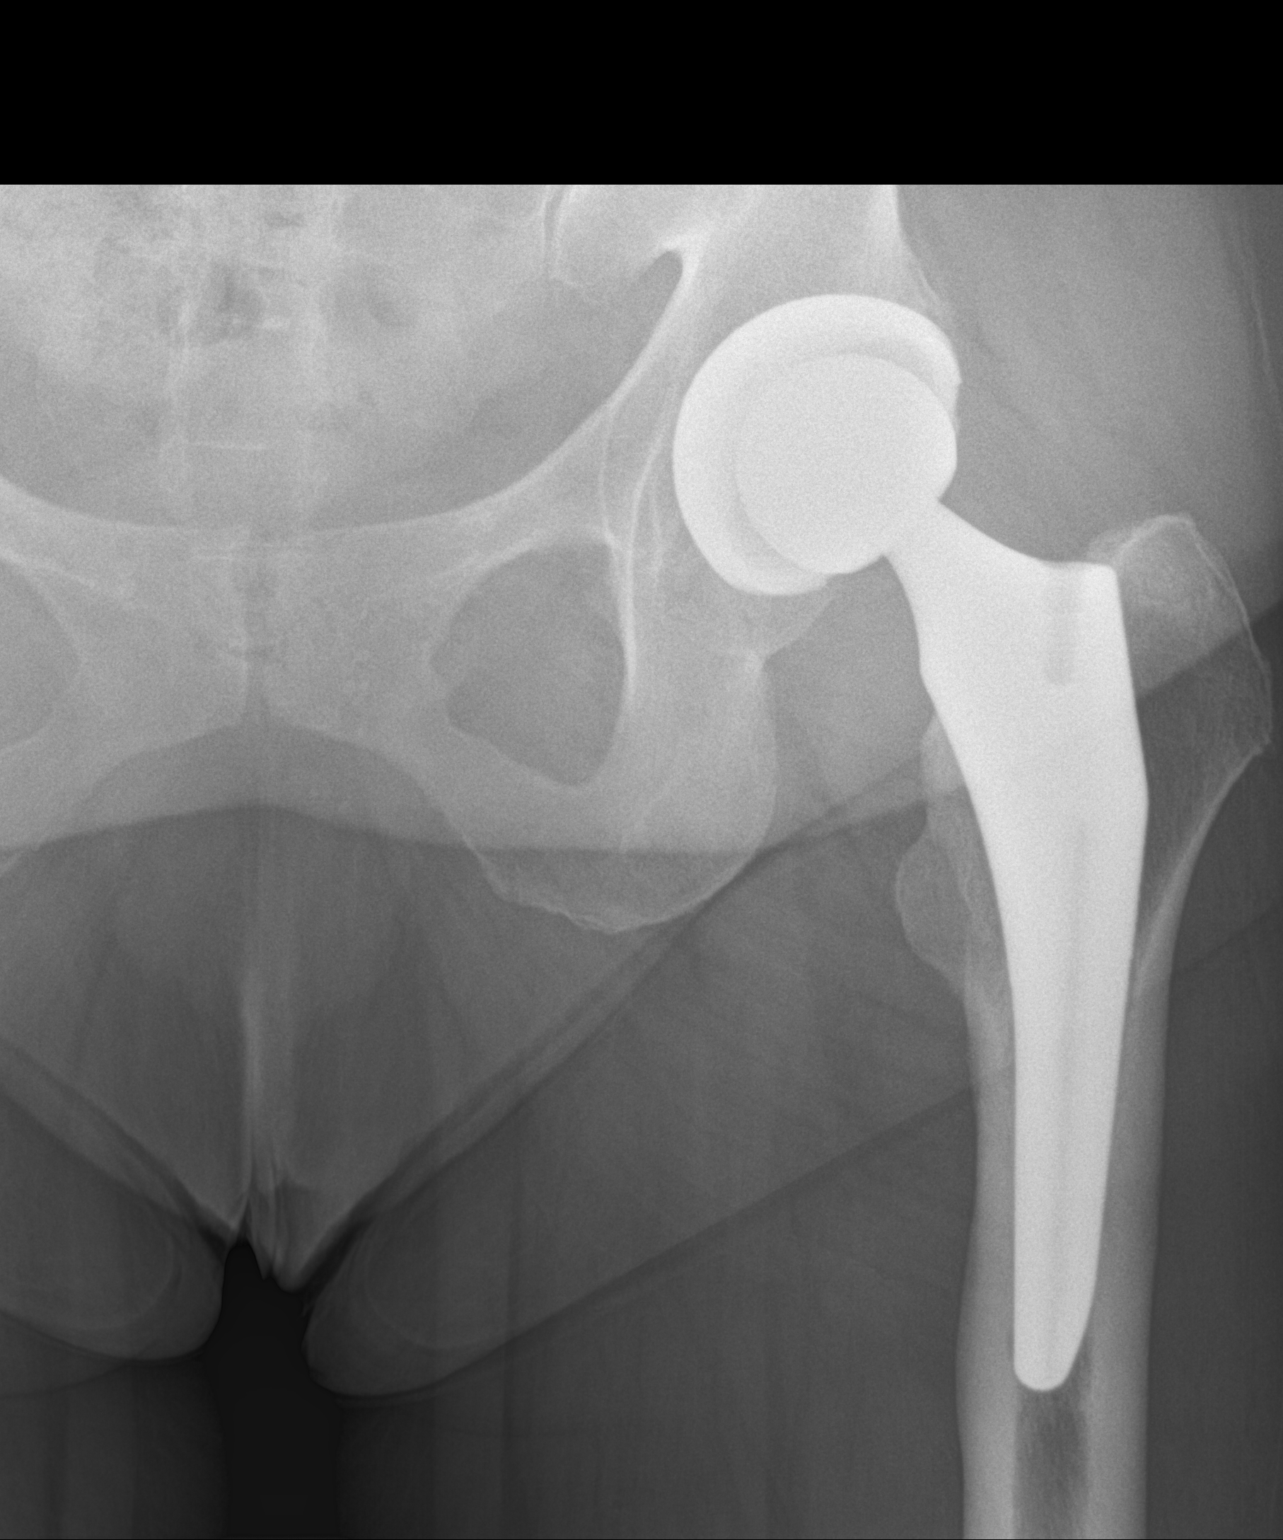
[im 2/2]
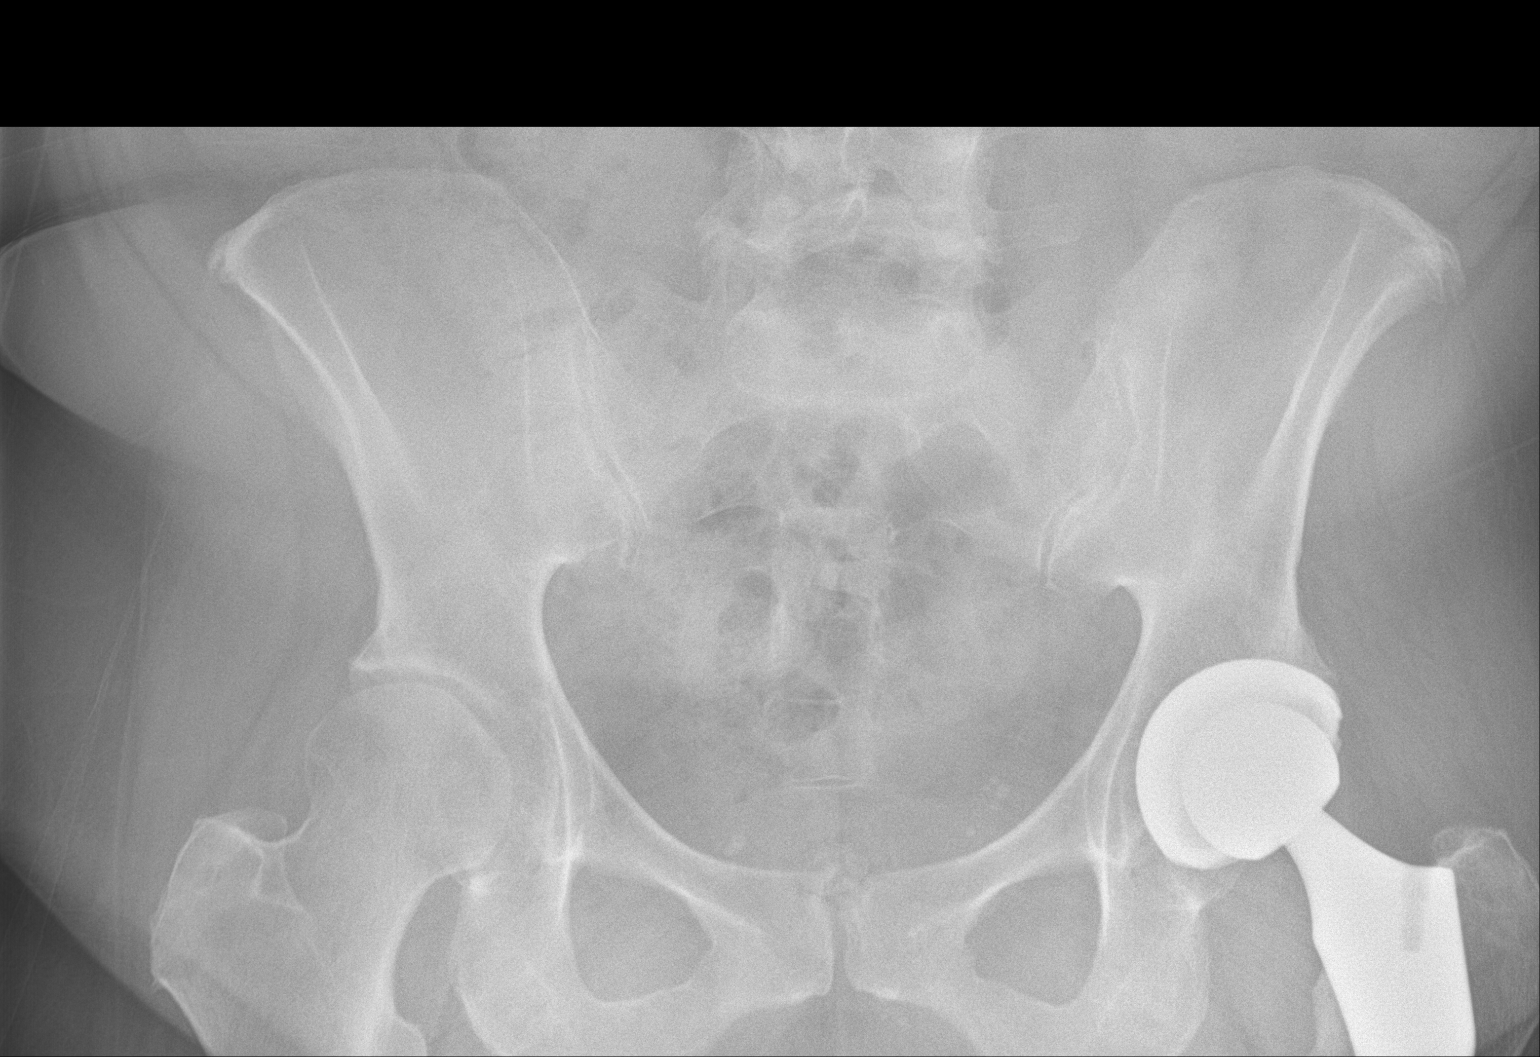

[hip lat]
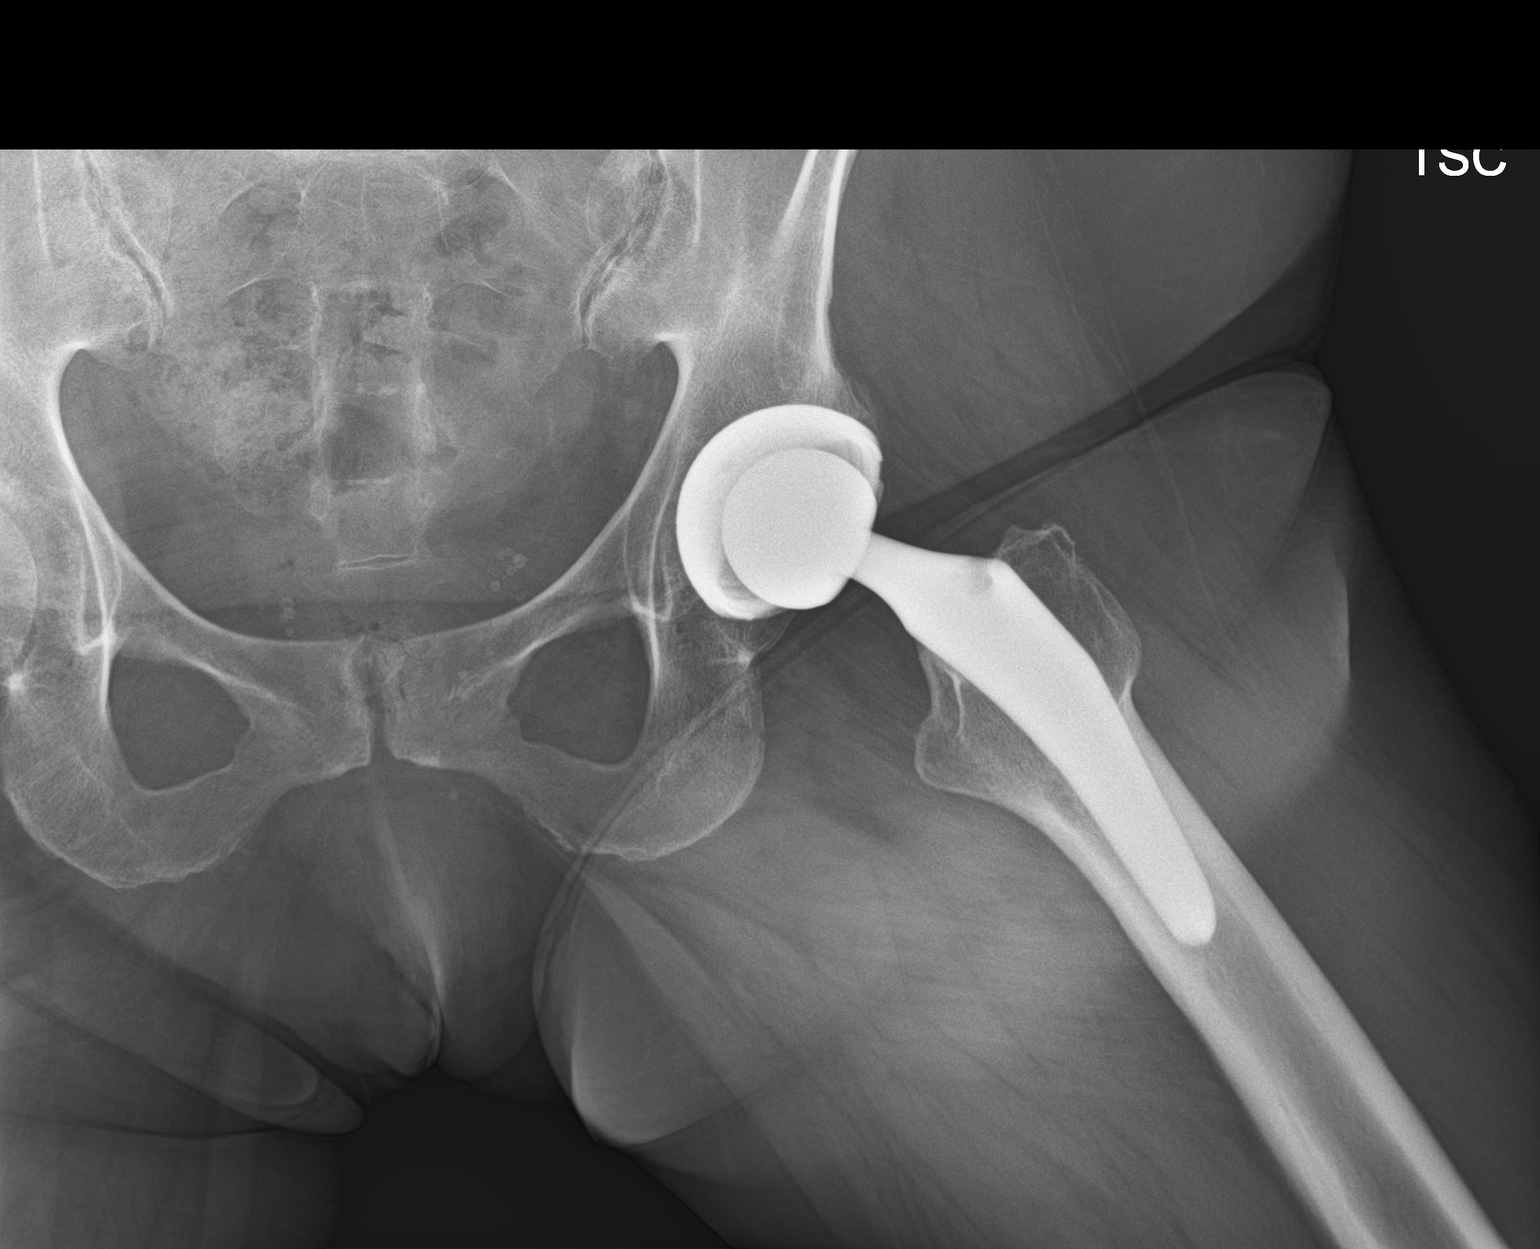

[3 of 3 positions shown; findings below may reference images not displayed]

FINDINGS: Changes of prior left hip replacement. No hardware or bony
complicating feature. No fracture, subluxation or dislocation. SI
joints are symmetric and unremarkable.
IMPRESSION: Prior left hip replacement.  No acute bony abnormality.

## 2016-02-10 NOTE — Progress Notes (Signed)
Pre visit review using our clinic review tool, if applicable. No additional management support is needed unless otherwise documented below in the visit note. 

## 2016-02-10 NOTE — Progress Notes (Signed)
Dr. Frederico Hamman T. Earle Burson, MD, [facility] Medicine [place], [address] Phone: [phone] Fax: [phone]  02/10/2016  Patient: Emily Moon, MRN: [id number], DOB: [birth year], 55 y.o.  Primary Physician:  Sheral Flow, NP   Chief Complaint  Patient presents with  . Hip Pain    Left   Subjective:   Emily Moon is a 55 y.o. very pleasant female patient who presents with the following:  Left hip is bothering her enough to the point that she is having trouble with her left hip. Neck still hurting.   She had a fall recently as detailed below, and both of her wrists are doing quite a bit better.  She is having some neck pain additionally and pain with terminal motion.  No radiculopathy.  She is also having some pain in the groin and this is on the same side that she had her total hip arthroplasty.  01/27/2016 Last OV with Owens Loffler, MD  R wrist - doing much better. She has been immobilized over the course of the last 3 weeks for ? Of distal ulna fx. Could not tolerate an EXOS fracture brace despite multiple molds, so we placed her in a thumb spica splint. Now, R wrist feels fine.  01/09/2016 Last OV with Owens Loffler, MD  Patient known well who slipped today in the garage and fell.  She "hurts everywhere ".  Primary areas of concern and pain of the left ankle which has some lateral swelling and additionally she has pain in her right wrist.  Wrist no swelling or bruising here.  She also hit her chin and and has some bruising there.  She has a headache somewhat.  She is otherwise thinking completely clearly.  She also banged both of her knees  Past Medical History, Surgical History, Social History, Family History, Problem List, Medications, and Allergies have been reviewed and updated if relevant.  Patient Active Problem List   Diagnosis Date Noted  . Bipolar disorder ([place]) 05/21/2014    Priority:  High  . MULTIPLE SCLEROSIS, PROGRESSIVE/RELAPSING 08/28/2010    Priority: High  . Chronic pain syndrome 01/07/2011    Priority: Medium  . Back pain 10/31/2014  . 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 Insomnia 09/01/2011  . Obesity 05/04/2011  . Perimenopausal vasomotor symptoms 02/05/2011  . HLD (hyperlipidemia) 01/20/2011  . SMOKER 01/20/2011  . RENAL CALCULUS, RECURRENT 11/13/2010  . GERD 08/28/2010  . Osteoarthritis 08/28/2010  . NEPHROLITHIASIS, HX OF 08/28/2010    Past Medical History  Diagnosis Date  . Bipolar disorder ([place]) 05/21/14  . Multiple sclerosis ([place])   . Depression   . GERD (gastroesophageal reflux disease)   . Nephrolithiasis   . Osteoporosis     osteoarthritis  . Fibromyalgia syndrome   . 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 Sleep apnea   . Arthritis   . PONV (postoperative nausea and vomiting)     no problem after cataract surgery  . Heart palpitations   . Edema     feet/legs  . Renal stone   . Urinary frequency   . Cataracts, bilateral   . Psoriasis   . Fungal infection     Finger nails    Past Surgical History  Procedure Laterality Date  . Tonsillectomy    . Joint replacement Left 2013    hip replacement  . Lithotripsy    .  Cataract extraction w/phaco Left 05/21/2015    Procedure: CATARACT EXTRACTION PHACO AND INTRAOCULAR LENS PLACEMENT (IOC);  Surgeon: Birder Robson, MD;  Location: [facility] ORS;  Service: Ophthalmology;  Laterality: Left;  [place] 00:35 AP% 22.9 CDE 8.11 fluid pack lot [id number]  . Foot surgery    . Thumb arthroscopy    . Eye surgery      tissue biopsy  . Cataract extraction w/phaco Right 06/04/2015    Procedure: CATARACT EXTRACTION PHACO AND INTRAOCULAR LENS PLACEMENT (IOC);  Surgeon: Birder Robson, MD;  Location: [facility] ORS;  Service: Ophthalmology;  Laterality: Right;  US:00:48 AP%: 10.5 CDE:5.08 Fluid lot  [id number]    Social History   Social History  . Marital Status: Divorced    Spouse Name: N/A  . Number of Children: 1  . Years of Education: N/A   Occupational History  . 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 Smoking status: Former Smoker -- 0.50 packs/day for 29 years    Types: Cigarettes  . Smokeless tobacco: Never Used     Comment: recenty 04/26/2014  . Alcohol Use: No  . Drug Use: No  . Sexual Activity: No   Other Topics Concern  . Not on file   Social History Narrative    Family History  Problem Relation Age of Onset  . Cancer Father     Allergies  Allergen Reactions  . Tylenol [Acetaminophen] Shortness Of Breath and Swelling  . Ultram [Tramadol Hcl]   . Albuterol Other (See Comments)    Makes pt feel hyped up.  . Aripiprazole   . Cefaclor   . Diclofenac Sodium   . Ibuprofen   . Levaquin [Levofloxacin In D5w] Other (See Comments)    Shoulder pain  . Naproxen Sodium   . Sulfa Antibiotics   . Voltaren [Diclofenac]     Medication list reviewed and updated in full in [facility].  GEN: No fevers, chills. Nontoxic. Primarily MSK c/o today. MSK: Detailed in the HPI GI: tolerating PO intake without difficulty Neuro: No numbness, parasthesias, or tingling associated. Otherwise the pertinent positives of the ROS are noted above.   Objective:   BP 100/72 mmHg  Pulse 90  Temp(Src) 99.4 F (37.4 C) (Oral)  Ht 5\' 9"  (1.753 m)  Wt 216 lb 4 oz (98.09 kg)  BMI 31.92 kg/m2  LMP 08/23/2014   GEN: WDWN, NAD, Non-toxic, Alert & Oriented x 3 HEENT: Atraumatic, Normocephalic.  Ears and Nose: No external deformity. EXTR: No clubbing/cyanosis/edema NEURO: Normal gait.  PSYCH: Normally interactive. Conversant. Not depressed or anxious appearing.  Calm demeanor.    Left hip: Abduction is normal.  Rotational movement is preserved status post total hip arthroplasty.  Mild to moderate tenderness at the trochanteric bursa.   Anterior hip is tender to palpation in the region of the hip flexors and hip abductors.  Neck range of motion with approximately 20% loss of motion in all directions.  Spinous processes are not tender to palpation.  Posterior paracervical musculature is mildly tender to palpation.  C5-T1 is intact.  Radiology: Dg Wrist Complete Right  01/27/2016  CLINICAL DATA:  Follow-up of right wrist fracture EXAM: RIGHT WRIST - COMPLETE 3+ VIEW COMPARISON:  01/06/2016; 12/27/2013 FINDINGS: No evidence of acute or chronic fracture with special attention paid to the distal aspect of the right ulna. Joint spaces are preserved. No erosions. No evidence of chondrocalcinosis. Regional soft tissues appear normal. No displacement of the pronator quadratus fat pad.  IMPRESSION: No evidence of acute or chronic fracture involving the right wrist with special attention paid to the distal ulna. Electronically Signed   By: Sandi Mariscal M.D.   On: 01/27/2016 15:20   Dg Hip Unilat With Pelvis 2-3 Views Left  02/10/2016  CLINICAL DATA:  Left hip pain for 2 months, status post fall. Prior left hip replacement EXAM: DG HIP (WITH OR WITHOUT PELVIS) 2-3V LEFT COMPARISON:  11/27/2015 FINDINGS: Changes of prior left hip replacement. No hardware or bony complicating feature. No fracture, subluxation or dislocation. SI joints are symmetric and unremarkable. IMPRESSION: Prior left hip replacement.  No acute bony abnormality. Electronically Signed   By: Rolm Baptise M.D.   On: 02/10/2016 15:33     Assessment and Plan:   Left hip pain - Plan: DG HIP UNILAT WITH PELVIS 2-3 VIEWS LEFT  Hip dysplasia, congenital  Cervicalgia  Plain films appear normal.  Hardware appears normal.  I do not see any gross fractures.  Historically the patient has had a heightened pain response to injury  I think that the most helpful thing at this point would be to do some physical therapy for both of her hips as well as her neck, status post fall.  She has  multiple chronic pain agents that she uses ongoing.  Follow-up: No Follow-up on file.  Orders Placed This Encounter  Procedures  . DG HIP UNILAT WITH PELVIS 2-3 VIEWS LEFT    Signed,  Nashia Remus T. Chrishawn Boley, MD   Patient's Medications  New Prescriptions   No medications on file  Previous Medications   ASPERCREME LIDOCAINE EX    Apply topically.   ASPIRIN 325 MG TABLET    Take 325 mg by mouth as needed.   BIOTIN PO    Take 5,000 mg by mouth daily.    CALCIPOTRIENE-BETAMETHASONE (TACLONEX) OINTMENT    Apply topically daily.   CALCIUM CARBONATE (TUMS - DOSED IN MG ELEMENTAL CALCIUM) 500 MG CHEWABLE TABLET    Chew 2 tablets by mouth daily.    CETIRIZINE (ZYRTEC) 10 MG TABLET    Take 10 mg by mouth daily.   CHOLECALCIFEROL (VITAMIN D-3 PO)    Take 10,000 Units by mouth daily.    CICLOPIROX (PENLAC) 8 % SOLUTION    Apply nightly over nail and surrounding skin. Apply daily over prior coat. After 7 days, may remove with alcohol and continue   CLOBETASOL CREAM (TEMOVATE) 0.05 %    Apply 1 application topically 2 (two) times daily.   DICLOFENAC SODIUM (VOLTAREN) 1 % GEL    Apply 4 g topically 4 (four) times daily.   DULOXETINE (CYMBALTA) 20 MG CAPSULE    Take 40 mg by mouth daily.   FLUTICASONE (FLONASE) 50 MCG/ACT NASAL SPRAY    Place 1 spray into both nostrils daily.   GARCINIA CAMBOGIA-CHROMIUM 500-200 MG-MCG TABS    Take 1 tablet by mouth 2 (two) times daily. Reported on 11/07/2015   HYDROCODONE-ACETAMINOPHEN (NORCO) 10-325 MG TABLET    Take 1 tablet by mouth every 6 (six) hours as needed.   HYDROQUINONE 4 % CREAM    APPLY TO AFFECTED AREAS ON FACE EVERY DAY AT BEDTIME   HYDROXYZINE (VISTARIL) 50 MG CAPSULE    Take 100 mg by mouth at bedtime.   LAMOTRIGINE (LAMICTAL) 150 MG TABLET    Take two tablets by mouth daily   LEVALBUTEROL (XOPENEX HFA) 45 MCG/ACT INHALER    Inhale 1-2 puffs into the lungs every 6 (six) hours as needed for  wheezing.   LYSINE 1000 MG TABS    Take 2,000 mg by mouth  daily.    NICOTINE (NICODERM CQ - DOSED IN MG/24 HOURS) 21 MG/24HR PATCH    Place 21 mg onto the skin daily.   OMEGA-3 FATTY ACIDS (FISH OIL PO)    Take 2 capsules by mouth at bedtime.   PHENAZOPYRIDINE HCL (AZO TABS PO)    Take 2 tablets by mouth as needed.    POLYETHYLENE GLYCOL POWDER (GLYCOLAX/MIRALAX) POWDER    MIX 17 GRAMS (1 CAPFUL) WITH 4-8 OZ OF LIQUID AND TAKE BY MOUTH TWICE DAILY AS NEEDED   PROBIOTIC PRODUCT (PROBIOTIC PO)    Take 1 tablet by mouth daily.   RANITIDINE (ZANTAC) 150 MG TABLET    Take 150 mg by mouth 2 (two) times daily as needed for heartburn.   TAMSULOSIN (FLOMAX) 0.4 MG CAPS CAPSULE    Take 1 capsule (0.4 mg total) by mouth daily.   TEMAZEPAM (RESTORIL) 30 MG CAPSULE    TAKE ONE TO TWO CAPSULES BY MOUTH NIGHTLY AT BEDTIME AS NEEDED    TIZANIDINE (ZANAFLEX) 4 MG TABLET    Take 2 tablets (8 mg total) by mouth 3 (three) times daily.  Modified Medications   No medications on file  Discontinued Medications   METAXALONE (SKELAXIN) 800 MG TABLET    Take 1 tablet (800 mg total) by mouth 3 (three) times daily.

## 2016-02-17 NOTE — Discharge Instructions (Signed)
INSTRUCTIONS FOLLOWING OCULOPLASTIC SURGERY °AMY M. FOWLER, MD ° °AFTER YOUR EYE SURGERY, THER ARE MANY THINGS THWIHC YOU, THE PATIENT, CAN DO TO ASSURE THE BEST POSSIBLE RESULT FROM YOUR OPERATION.  THIS SHEET SHOULD BE REFERRED TO WHENEVER QUESTIONS ARISE.  IF THERE ARE ANY QUESTIONS NOT ANSWERED HERE, DO NOT HESITATE TO CALL OUR OFFICE AT [phone] OR [phone].  THERE IS ALWAYS [facility] AVAILABLE TO CALL IF QUESTIONS OR PROBLEMS ARISE. ° °VISION: Your vision may be blurred and out of focus after surgery until you are able to stop using your ointment, swelling resolves and your eye(s) heal. This may take 1 to 2 weeks at the least.  If your vision becomes gradually more dim or dark, this is not normal and you need to call our office immediately. ° °EYE CARE: For the first 48 hours after surgery, use ice packs frequently - “20 minutes on, 20 minutes off” - to help reduce swelling and bruising.  Small bags of frozen peas or corn make good ice packs along with cloths soaked in ice water.  If you are wearing a patch or other type of dressing following surgery, keep this on for the amount of time specified by your doctor.  For the first week following surgery, you will need to treat your stitches with great care.  If is OK to shower, but take care to not allow soapy water to run into your eye(s) to help reduce changes of infection.  You may gently clean the eyelashes and around the eye(s) with cotton balls and sterile water, BUT DO NOT RUB THE STITCHES VIGOROUSLY.  Keeping your stitches moist with ointment will help promote healing with minimal scar formation. ° °ACTIVITY: When you leave the surgery center, you should go home, rest and be inactive.  The eye(s) may feel scratchy and keeping the eyes closed will allow for faster healing.  The first week following surgery, avoid straining (anything making the face turn red) or lifting over 20 pounds.  Additionally, avoid bending which causes your head to go below  your waist.  Using your eyes will NOT harm them, so feel free to read, watch television, use the computer, etc as desired.  Driving depends on each individual, so check with your doctor if you have questions about driving. ° °MEDICATIONS:  You will be given a prescription for an ointment to use 4 times a day on your stitches.  You can use the ointment in your eyes if they feel scratchy or irritated.  If you eyelid(s) don’t close completely when you sleep, put some ointment in your eyes before bedtime. ° °EMERGENCY: If you experience SEVERE EYE PAIN OR HEADACHE UNRELIEVED BY TYLENOL OR PERCOCET, NAUSEA OR VOMITING, WORSENING REDNESS, OR WORSENING VISION (ESPECIALLY VISION THAT WA INITIALLY BETTER) CALL [phone] OR [phone] DURING BUSINESS HOURS OR AFTER HOURS. ° °General Anesthesia, Adult, Care After °Refer to this sheet in the next few weeks. These instructions provide you with information on caring for yourself after your procedure. Your health care provider may also give you more specific instructions. Your treatment has been planned according to current medical practices, but problems sometimes occur. Call your health care provider if you have any problems or questions after your procedure. °WHAT TO EXPECT AFTER THE PROCEDURE °After the procedure, it is typical to experience: °· Sleepiness. °· Nausea and vomiting. °HOME CARE INSTRUCTIONS °· For the first 24 hours after general anesthesia: °¨ Have a responsible person with you. °¨ Do not drive a car. If you   are alone, do not take public transportation. °¨ Do not drink alcohol. °¨ Do not take medicine that has not been prescribed by your health care provider. °¨ Do not sign important papers or make important decisions. °¨ You may resume a normal diet and activities as directed by your health care provider. °· Change bandages (dressings) as directed. °· If you have questions or problems that seem related to general anesthesia, call the hospital and ask for  the anesthetist or anesthesiologist on call. °SEEK MEDICAL CARE IF: °· You have nausea and vomiting that continue the day after anesthesia. °· You develop a rash. °SEEK IMMEDIATE MEDICAL CARE IF:  °· You have difficulty breathing. °· You have chest pain. °· You have any allergic problems. °  °This information is not intended to replace advice given to you by your health care provider. Make sure you discuss any questions you have with your health care provider. °  °Document Released: 02/08/2001 Document Revised: 11/23/2014 Document Reviewed: 03/02/2012 °Elsevier Interactive Patient Education ©2016 [employer] ° °

## 2016-02-18 ENCOUNTER — Ambulatory Visit: Payer: PPO | Admitting: Anesthesiology

## 2016-02-18 ENCOUNTER — Encounter
Admission: RE | Disposition: A | Discharge status: Home / Self Care | Payer: Self-pay | Source: Ambulatory Visit | Attending: Ophthalmology

## 2016-02-18 ENCOUNTER — Ambulatory Visit
Admission: RE | Admit: 2016-02-18 | Discharge: 2016-02-18 | Disposition: A | Discharge status: Home / Self Care | Payer: PPO | Source: Ambulatory Visit | Attending: Ophthalmology | Admitting: Ophthalmology

## 2016-02-18 DIAGNOSIS — F319 Bipolar disorder, unspecified: Secondary | ICD-10-CM | POA: Diagnosis not present

## 2016-02-18 DIAGNOSIS — Z886 Allergy status to analgesic agent status: Secondary | ICD-10-CM | POA: Insufficient documentation

## 2016-02-18 DIAGNOSIS — E7800 Pure hypercholesterolemia, unspecified: Secondary | ICD-10-CM | POA: Insufficient documentation

## 2016-02-18 DIAGNOSIS — K219 Gastro-esophageal reflux disease without esophagitis: Secondary | ICD-10-CM | POA: Insufficient documentation

## 2016-02-18 DIAGNOSIS — Z888 Allergy status to other drugs, medicaments and biological substances status: Secondary | ICD-10-CM | POA: Insufficient documentation

## 2016-02-18 DIAGNOSIS — Z882 Allergy status to sulfonamides status: Secondary | ICD-10-CM | POA: Insufficient documentation

## 2016-02-18 DIAGNOSIS — G4730 Sleep apnea, unspecified: Secondary | ICD-10-CM | POA: Diagnosis not present

## 2016-02-18 DIAGNOSIS — H02403 Unspecified ptosis of bilateral eyelids: Secondary | ICD-10-CM | POA: Diagnosis not present

## 2016-02-18 DIAGNOSIS — Z87891 Personal history of nicotine dependence: Secondary | ICD-10-CM | POA: Insufficient documentation

## 2016-02-18 DIAGNOSIS — M1990 Unspecified osteoarthritis, unspecified site: Secondary | ICD-10-CM | POA: Diagnosis not present

## 2016-02-18 DIAGNOSIS — G35 Multiple sclerosis: Secondary | ICD-10-CM | POA: Insufficient documentation

## 2016-02-18 DIAGNOSIS — Z881 Allergy status to other antibiotic agents status: Secondary | ICD-10-CM | POA: Diagnosis not present

## 2016-02-18 DIAGNOSIS — Z87442 Personal history of urinary calculi: Secondary | ICD-10-CM | POA: Diagnosis not present

## 2016-02-18 HISTORY — DX: Pneumonia, unspecified organism: J18.9

## 2016-02-18 HISTORY — DX: Pure hypercholesterolemia, unspecified: E78.00

## 2016-02-18 HISTORY — DX: Dizziness and giddiness: R42

## 2016-02-18 HISTORY — DX: Headache: R51

## 2016-02-18 HISTORY — DX: Other cervical disc degeneration, unspecified cervical region: M50.30

## 2016-02-18 HISTORY — DX: Reserved for inherently not codable concepts without codable children: IMO0001

## 2016-02-18 HISTORY — DX: Headache, unspecified: R51.9

## 2016-02-18 HISTORY — PX: PTOSIS REPAIR: SHX6568

## 2016-02-18 SURGERY — REPAIR, BLEPHAROPTOSIS
Anesthesia: Monitor Anesthesia Care | Site: Eye | Laterality: Bilateral | Wound class: Clean

## 2016-02-18 MED ORDER — PROPOFOL 500 MG/50ML IV EMUL
INTRAVENOUS | Status: DC | PRN
  Administered 2016-02-18: 100 ug/kg/min via INTRAVENOUS

## 2016-02-18 MED ORDER — ERYTHROMYCIN 5 MG/GM OP OINT
TOPICAL_OINTMENT | OPHTHALMIC | Status: DC
Start: 2016-02-18 — End: 2016-04-23

## 2016-02-18 MED ORDER — ERYTHROMYCIN 5 MG/GM OP OINT
TOPICAL_OINTMENT | OPHTHALMIC | Status: DC | PRN
  Administered 2016-02-18: 1 via OPHTHALMIC

## 2016-02-18 MED ORDER — LACTATED RINGERS IV SOLN
INTRAVENOUS | Status: DC
  Administered 2016-02-18 (×2): via INTRAVENOUS

## 2016-02-18 MED ORDER — BSS IO SOLN
INTRAOCULAR | Status: DC | PRN
  Administered 2016-02-18: 15 mL via INTRAOCULAR

## 2016-02-18 MED ORDER — LIDOCAINE-EPINEPHRINE 2 %-1:100000 IJ SOLN
INTRAMUSCULAR | Status: DC | PRN
  Administered 2016-02-18: 4.5 mL via OPHTHALMIC

## 2016-02-18 MED ORDER — MIDAZOLAM HCL 2 MG/2ML IJ SOLN
INTRAMUSCULAR | Status: DC | PRN
Start: 2016-02-18 — End: 2016-02-18
  Administered 2016-02-18: 2 mg via INTRAVENOUS
  Administered 2016-02-18 (×2): 1 mg via INTRAVENOUS

## 2016-02-18 MED ORDER — FENTANYL CITRATE (PF) 100 MCG/2ML IJ SOLN
INTRAMUSCULAR | Status: DC | PRN
  Administered 2016-02-18 (×2): 50 ug via INTRAVENOUS

## 2016-02-18 MED ORDER — TETRACAINE HCL 0.5 % OP SOLN
OPHTHALMIC | Status: DC | PRN
  Administered 2016-02-18: 5 [drp] via OPHTHALMIC

## 2016-02-18 MED ORDER — LACTATED RINGERS IV SOLN
INTRAVENOUS | Status: DC
  Administered 2016-02-18: 08:00:00 via INTRAVENOUS

## 2016-02-18 SURGICAL SUPPLY — 34 items
APPLICATOR COTTON TIP WD 3 STR (MISCELLANEOUS) ×4 IMPLANT
BLADE SURG 15 STRL LF DISP TIS (BLADE) ×1 IMPLANT
BLADE SURG 15 STRL SS (BLADE) ×1
CORD BIP STRL DISP 12FT (MISCELLANEOUS) ×2 IMPLANT
DRAPE HEAD BAR (DRAPES) ×2 IMPLANT
GAUZE SPONGE 4X4 12PLY STRL (GAUZE/BANDAGES/DRESSINGS) ×2 IMPLANT
GAUZE SPONGE NON-WVN 2X2 STRL (MISCELLANEOUS) ×10 IMPLANT
GLOVE SURG LX 7.0 MICRO (GLOVE) ×2
GLOVE SURG LX STRL 7.0 MICRO (GLOVE) ×2 IMPLANT
MARKER SKIN XFINE TIP W/RULER (MISCELLANEOUS) ×2 IMPLANT
NEEDLE FILTER BLUNT 18X 1/2SAF (NEEDLE) ×1
NEEDLE FILTER BLUNT 18X1 1/2 (NEEDLE) ×1 IMPLANT
NEEDLE HYPO 30X.5 LL (NEEDLE) ×4 IMPLANT
PACK DRAPE NASAL/ENT (PACKS) ×2 IMPLANT
SOL PREP PVP 2OZ (MISCELLANEOUS) ×2
SOLUTION PREP PVP 2OZ (MISCELLANEOUS) ×1 IMPLANT
SPONGE VERSALON 2X2 STRL (MISCELLANEOUS) ×10
SUT CHROMIC 4-0 (SUTURE)
SUT CHROMIC 4-0 M2 12X2 ARM (SUTURE)
SUT CHROMIC 5 0 P 3 (SUTURE) IMPLANT
SUT ETHILON 4 0 CL P 3 (SUTURE) IMPLANT
SUT MERSILENE 4-0 S-2 (SUTURE) IMPLANT
SUT PDS AB 4-0 P3 18 (SUTURE) IMPLANT
SUT PLAIN GUT (SUTURE) ×2 IMPLANT
SUT PROLENE 5 0 P 3 (SUTURE) ×2 IMPLANT
SUT PROLENE 6 0 P 1 18 (SUTURE) ×2 IMPLANT
SUT SILK 4 0 G 3 (SUTURE) IMPLANT
SUT VIC AB 5-0 P-3 18X BRD (SUTURE) ×2 IMPLANT
SUT VIC AB 5-0 P3 18 (SUTURE) ×2
SUT VICRYL 7 0 TG140 8 (SUTURE) IMPLANT
SUTURE CHRMC 4-0 M2 12X2 ARM (SUTURE) IMPLANT
SYR 3ML LL SCALE MARK (SYRINGE) ×2 IMPLANT
SYRINGE 10CC LL (SYRINGE) ×2 IMPLANT
WATER STERILE IRR 500ML POUR (IV SOLUTION) ×2 IMPLANT

## 2016-02-18 NOTE — Anesthesia Procedure Notes (Signed)
Procedure Name: MAC Performed by: Eleftherios Dudenhoeffer Pre-anesthesia Checklist: Patient identified, Emergency Drugs available, Suction available, Timeout performed and Patient being monitored Patient Re-evaluated:Patient Re-evaluated prior to inductionOxygen Delivery Method: Nasal cannula Placement Confirmation: positive ETCO2     

## 2016-02-18 NOTE — Interval H&P Note (Signed)
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 After consideration of risks, benefits and other options for treatment, the patient has consented to  Procedure(s) with comments: PTOSIS REPAIR (Bilateral) - LEAVE PT EARLY AM as a surgical intervention .  The patient's history has been reviewed, patient examined, no change in status, stable for surgery.  I have reviewed the patient's chart and labs.  Questions were answered to the patient's satisfaction.     Vickki Muff, Kyley Solow M

## 2016-02-18 NOTE — Anesthesia Preprocedure Evaluation (Signed)
Anesthesia Evaluation  Patient identified by MRN, date of birth, ID band Patient awake    Reviewed: Allergy & Precautions, H&P , NPO status   History of Anesthesia Complications (+) history of anesthetic complications (ponv)  Airway Mallampati: II  TM Distance: >3 FB Neck ROM: full    Dental no notable dental hx.    Pulmonary sleep apnea , former smoker,    Pulmonary exam normal        Cardiovascular negative cardio ROS Normal cardiovascular exam     Neuro/Psych    GI/Hepatic Neg liver ROS, GERD  Medicated,  Endo/Other  negative endocrine ROS  Renal/GU      Musculoskeletal   Abdominal   Peds  Hematology negative hematology ROS (+)   Anesthesia Other Findings   Reproductive/Obstetrics                             Anesthesia Physical Anesthesia Plan  ASA: II  Anesthesia Plan: MAC   Post-op Pain Management:    Induction:   Airway Management Planned:   Additional Equipment:   Intra-op Plan:   Post-operative Plan:   Informed Consent: I have reviewed the patients History and Physical, chart, labs and discussed the procedure including the risks, benefits and alternatives for the proposed anesthesia with the patient or authorized representative who has indicated his/her understanding and acceptance.     Plan Discussed with: CRNA  Anesthesia Plan Comments:         Anesthesia Quick Evaluation

## 2016-02-18 NOTE — Op Note (Signed)
Preoperative Diagnosis:  Visually significant blepharoptosis both Upper Eyelid(s)  Postoperative Diagnosis:  Same.  Procedure(s) Performed:   Blepharoptosis repair with levator aponeurosis advancement bilateral Upper Eyelid(s)  Teaching Surgeon: Philis Pique. Vickki Muff, M.D.  Assistants: none  Anesthesia: MAC  Specimens: None.  Estimated Blood Loss: Minimal.  Complications: None.  Operative Findings: None Dictated  PROCEDURE:  Allergies were reviewed and the patient is allergic to albuterol; tylenol; ultram; aripiprazole; cefaclor; diclofenac sodium; halcion; ibuprofen; levaquin; naproxen sodium; sulfa antibiotics; and voltaren..   After the risks, benefits, complications and alternatives were discussed with the patient, appropriate informed consent was obtained. While seated in an upright position and looking in primary gaze, the mid pupillary line was marked on the upper eyelid margins bilaterally. The patient was then brought to the operating suite and reclined supine.  Timeout was conducted and the patient was sedated. Local anesthetic consisting of a 50-50 mixture of 2% lidocaine with epinephrine and 0.75% bupivacaine with added Hylenex was injected subcutaneously to the both upper eyelid(s). After adequate local was instilled, the patient was prepped and draped in the usual sterile fashion for eyelid surgery.   Attention was turned to the upper eyelids. A 44mm upper eyelid crease incision line was marked with calipers on both upper eyelid(s).  A small pinch of skin was measured and marked in standard blepharoplasty style fashion Attention was turned to the  right upper eyelid. A #15 blade was used to open the premarked incision line and hemostasis was obtained with bipolar cautery. A skin only flap was excised . Westcott scissors were then used to transect through orbicularis for the length of the incision down to the tarsal plate. Epitarsus was dissected to create a smooth surface to  suture to. Dissection was then carried superiorly in the plane between orbicularis and orbital septum. Once the preaponeurotic fat pocket was identified, the orbital septum was opened. This revealed the levator and its aponeurosis.    Attention was then turned to the opposite eyelid where the same procedure was performed in the same manner.   3 interrupted 5-0 Prolene sutures were then passed partial thickness through the tarsal plates of both upper eyelid(s). These sutures were placed in line with the mid pupillary, medial limbal, and lateral limbal lines. The sutures were fixed to the levator aponeurosis and adjusted until a nice lid height and contour were achieved. Once nice symmetry was achieved, the skin incisions were closed with a running 6-0 fast absorbing plain suture. The patient tolerated the procedure well. Erythromycin ophthalmic ointment was applied to the incision site(s) followed by ice packs. The patient was taken to the recovery area where she recovered without difficulty.  Post-Op Plan/Instructions:  Ms. Nancarrow was instructed to use ice packs frequently for the next 48 hours. Her was instructed to use erythromycin ophthalmic ointment on her incisions 4 times a day for the next 12 to 14 days. She was given a prescription for Percocet for pain control should Tylenol not be effective. She was asked to to follow up in 2 weeks' time at the [facility] in [place], [place] or sooner as needed for problems.  Teaching Surgeon Attestation: None  Amy M. Vickki Muff, M.D. Attending,Ophthalmology

## 2016-02-18 NOTE — H&P (Signed)
  See history and physical performed at [facility] on 02/05/2016 and scanned into the chart

## 2016-02-18 NOTE — Transfer of Care (Signed)
Immediate Anesthesia Transfer of Care Note  Patient: Emily Moon  Procedure(s) Performed: Procedure(s) with comments: BILATERAL PTOSIS REPAIR UPPER EYELIDS (Bilateral) - LEAVE PT EARLY AM  Patient Location: PACU  Anesthesia Type: MAC  Level of Consciousness: awake, alert  and patient cooperative  Airway and Oxygen Therapy: Patient Spontanous Breathing and Patient connected to supplemental oxygen  Post-op Assessment: Post-op Vital signs reviewed, Patient's Cardiovascular Status Stable, Respiratory Function Stable, Patent Airway and No signs of Nausea or vomiting  Post-op Vital Signs: Reviewed and stable  Complications: No apparent anesthesia complications

## 2016-02-18 NOTE — Anesthesia Postprocedure Evaluation (Signed)
Anesthesia Post Note  Patient: Emily Moon  Procedure(s) Performed: Procedure(s) (LRB): BILATERAL PTOSIS REPAIR UPPER EYELIDS (Bilateral)  Patient location during evaluation: PACU Anesthesia Type: MAC Level of consciousness: awake and alert Pain management: pain level controlled Vital Signs Assessment: post-procedure vital signs reviewed and stable Respiratory status: spontaneous breathing Cardiovascular status: stable Anesthetic complications: no    Kasara Schomer, III,  Welma Mccombs D

## 2016-02-19 ENCOUNTER — Encounter: Payer: Self-pay | Admitting: Ophthalmology

## 2016-02-19 ENCOUNTER — Other Ambulatory Visit: Payer: Self-pay

## 2016-02-19 DIAGNOSIS — M1990 Unspecified osteoarthritis, unspecified site: Secondary | ICD-10-CM

## 2016-02-19 NOTE — Telephone Encounter (Signed)
Pt left v/m requesting rx hydrocodone apap. Call when ready for pick up. Last printed # 90 on 01/27/16 and last seen 02/10/16. Pt would like to pick up rx on 02/20/16. FYI to Dr Lorelei Pont; pt has fallen again and doesn't think there is anything to be done about that; pt also cannot do PT for 14 days due to pt having eye surgery on 02/18/16.

## 2016-02-20 ENCOUNTER — Other Ambulatory Visit: Payer: Self-pay

## 2016-02-20 DIAGNOSIS — Z720 Tobacco use: Secondary | ICD-10-CM

## 2016-02-20 MED ORDER — NICOTINE 21 MG/24HR TD PT24: 21.0000 mg | MEDICATED_PATCH | Freq: Every day | TRANSDERMAL | Status: DC

## 2016-02-20 MED ORDER — HYDROCODONE-ACETAMINOPHEN 10-325 MG PO TABS: 1.0000 | ORAL_TABLET | Freq: Four times a day (QID) | ORAL | Status: DC | PRN

## 2016-02-20 NOTE — Telephone Encounter (Signed)
I have seen her a number of times recently post-trauma, but it has always been my intention for her PCP to manage pain medications. [name] and I have discussed previously.   I will forward to Mrs. Emily Moon.

## 2016-02-20 NOTE — Telephone Encounter (Signed)
Called patient and notified her that Rx is ready for pick up. Left in front office.

## 2016-02-20 NOTE — Telephone Encounter (Signed)
Pt left v/m that pharmacist at [facility] advised pt if PCP will send in nicotine patches as nicotine therapy pt will be able to get patches at no charge and presently pt paying $ 60/month. Pt request cb.

## 2016-02-25 ENCOUNTER — Ambulatory Visit: Payer: PPO | Admitting: Physical Therapy

## 2016-03-03 ENCOUNTER — Encounter: Payer: PPO | Admitting: Physical Therapy

## 2016-03-03 ENCOUNTER — Ambulatory Visit (INDEPENDENT_AMBULATORY_CARE_PROVIDER_SITE_OTHER): Payer: PPO | Admitting: Podiatry

## 2016-03-03 ENCOUNTER — Encounter: Payer: Self-pay | Admitting: Podiatry

## 2016-03-03 VITALS — BP 115/76 | HR 86 | Resp 16

## 2016-03-03 DIAGNOSIS — L600 Ingrowing nail: Secondary | ICD-10-CM | POA: Diagnosis not present

## 2016-03-03 MED ORDER — NEOMYCIN-POLYMYXIN-HC 1 % OT SOLN: OTIC | Status: DC

## 2016-03-03 NOTE — Progress Notes (Signed)
She presents today with a chief complaint of painful ingrown toenails to the hallux bilateral left greater than right. She states it is been bothering her for quite some time and she is ready to have them removed.  Objective: Vital signs are stable she is alert and oriented 3. Pulses are strongly palpable. Sharp incurvated nail margins to the tibia and fibula border of the hallux bilateral resulting in a mild paronychia. No calf pain.  Assessment: Ingrown toenails to the tibial and fibular border of the hallux bilaterally.  Plan: Perform the chemical matricectomy today after local anesthetic. She tolerated this procedure well. She was provided with both oral and written home-going instructions for care and soaking of her toe as well as a prescription for Cortisporin Otic to be applied twice daily after soaking. I will follow-up with her in 1 week.

## 2016-03-03 NOTE — Patient Instructions (Signed)

## 2016-03-09 ENCOUNTER — Ambulatory Visit (INDEPENDENT_AMBULATORY_CARE_PROVIDER_SITE_OTHER): Payer: PPO | Admitting: Podiatry

## 2016-03-09 ENCOUNTER — Encounter: Payer: Self-pay | Admitting: Podiatry

## 2016-03-09 VITALS — BP 132/86 | HR 69 | Resp 12

## 2016-03-09 DIAGNOSIS — L600 Ingrowing nail: Secondary | ICD-10-CM

## 2016-03-09 NOTE — Patient Instructions (Signed)

## 2016-03-10 ENCOUNTER — Encounter: Payer: PPO | Admitting: Physical Therapy

## 2016-03-10 NOTE — Progress Notes (Signed)
She presents today states that she continues to soak in Betadine and warm water. She presents for follow-up of matrixectomy tibial and fibular border of the hallux bilateral. No complications states that they feel much better.  Objective: Vital signs are stable she is alert and oriented 3. Toes appear to be healing very quickly there is no erythema edema saline as drainage or odor margins appear to be clean and without signs of infection.  Assessment: Well-healing surgical toes hallux bilateral.  Plan: Discontinue Betadine spell with Epsom salts and warm water soaks covered during the daytime and leave open at bedtime continue soaking to completely resolve.

## 2016-03-12 ENCOUNTER — Telehealth: Payer: Self-pay

## 2016-03-12 ENCOUNTER — Encounter: Payer: PPO | Admitting: Physical Therapy

## 2016-03-12 NOTE — Telephone Encounter (Signed)
Noted  

## 2016-03-12 NOTE — Telephone Encounter (Signed)
Pt left v/m requesting cb about possibly starting statin med but pt has a lot of questions prior to starting med. I called pt back and she said she has already made an appt on 03/16/16 to see Allie Bossier NP and discuss her med questions. Nothing further needed at this time. FYI to Allie Bossier NP.

## 2016-03-13 ENCOUNTER — Telehealth: Payer: Self-pay | Admitting: *Deleted

## 2016-03-13 NOTE — Telephone Encounter (Signed)
Pt states she was told by Dr. Milinda Pointer that after a week or 2 she could switch to epsom salt soaks, but they are too painful and she switched back to betadine, is this okay?  Left message informing pt that the betadine soaks were fine, to continue soaks 2 times daily followed by the antibiotic topical ordered by Dr. Milinda Pointer, that at the end of the 3 or 4th week could allow to air dry in the evening if not walking, or in enclosed shoes, and should continue the soaks until the area has a dry hard scab, without redness, drainage or swelling or pain. I encouraged pt to call with concerns.

## 2016-03-16 ENCOUNTER — Encounter: Payer: PPO | Admitting: Physical Therapy

## 2016-03-16 ENCOUNTER — Encounter: Payer: Self-pay | Admitting: Primary Care

## 2016-03-16 ENCOUNTER — Ambulatory Visit (INDEPENDENT_AMBULATORY_CARE_PROVIDER_SITE_OTHER): Payer: PPO | Admitting: Primary Care

## 2016-03-16 VITALS — BP 122/76 | HR 104 | Temp 99.4°F | Ht 69.0 in | Wt 215.8 lb

## 2016-03-16 DIAGNOSIS — R42 Dizziness and giddiness: Secondary | ICD-10-CM | POA: Diagnosis not present

## 2016-03-16 DIAGNOSIS — R739 Hyperglycemia, unspecified: Secondary | ICD-10-CM | POA: Diagnosis not present

## 2016-03-16 DIAGNOSIS — E785 Hyperlipidemia, unspecified: Secondary | ICD-10-CM

## 2016-03-16 DIAGNOSIS — M1990 Unspecified osteoarthritis, unspecified site: Secondary | ICD-10-CM | POA: Diagnosis not present

## 2016-03-16 DIAGNOSIS — E669 Obesity, unspecified: Secondary | ICD-10-CM

## 2016-03-16 DIAGNOSIS — E559 Vitamin D deficiency, unspecified: Secondary | ICD-10-CM

## 2016-03-16 MED ORDER — HYDROCODONE-ACETAMINOPHEN 10-325 MG PO TABS: 1.0000 | ORAL_TABLET | Freq: Four times a day (QID) | ORAL | Status: DC | PRN

## 2016-03-16 NOTE — Progress Notes (Signed)
Pre visit review using our clinic review tool, if applicable. No additional management support is needed unless otherwise documented below in the visit note. 

## 2016-03-16 NOTE — Assessment & Plan Note (Signed)
Due last month for re-draw, has not completed yet. She is not fasting today. Labs ordered for later this week.  Poor diet and is not taking Fish Oil. Cannot tolerate statins. Recommendations made for improvements in diet. Referral to nutritionist made.

## 2016-03-16 NOTE — Progress Notes (Signed)
Subjective:    Patient ID: Emily Moon, female    DOB: 09-Feb-1961, 55 y.o.   MRN: [id number]  HPI  Emily Moon is a 55 year old female who presents today to discuss medications and with a chief complaint of dizziness. She is also due for repeat cholesterol check but is not fasting. She cannot take   1) Dizziness: History of dizziness in the past. Recently began yesterday after standing and walking to the refrigerator. This lasted for about 30-40 seconds and dissipated thereafter. She has a history of hyperlipidemia, MS, bipolar disorder. She denies chest pain, shortness of breath, falls. She is managed on numerous medications including opiods for chronic back pain. This occurred again after standing up in the exam room today lasting   2) Obesity: Struggled with for years. She is motivated to lose weight. She has a poor diet and does not exercise. She has not visited a nutritionist in the past and would be willing to do so.   Her diet currently consists of: Breakfast: Fast food and iced coffee Lunch: Skips, Fast food Dinner: Naval architect, left over [employer] Desserts: Occasionally Beverages: Water (6 bottles daily), coffee  Wt Readings from Last 3 Encounters:  03/16/16 215 lb 12.8 oz (97.886 kg)  02/18/16 215 lb (97.523 kg)  02/10/16 216 lb 4 oz (98.09 kg)    3) Hyperlipidemia: Lipids in December 2016 with TC of 306, Trigs of 346, LDL of 207. She cannot tolerate statins and refused other treatment last visit due to cost. She was strongly encouraged to start Fish Oil 3000 mg daily and work on her diet. She is worried about her levels today and is feeling anxious as she is at risk for heart attack and or stroke.   She continues to eat a poor diet and is not exercising. Diet discussed above. She is not taking Fish Oil as recommended. She is due to repeat lipids today but is not fasting.  Review of Systems  Constitutional: Positive for fatigue.  Eyes: Negative for visual  disturbance.  Respiratory: Negative for shortness of breath.   Cardiovascular: Negative for chest pain.  Neurological: Positive for dizziness. Negative for syncope, weakness and headaches.       Past Medical History  Diagnosis Date  . Bipolar disorder ([place]) 05/21/14  . Multiple sclerosis (HCC)     weakness  . Depression   . GERD (gastroesophageal reflux disease)   . Osteoporosis     osteoarthritis  . Fibromyalgia syndrome   . 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 Heart palpitations   . Edema     feet/legs  . Urinary frequency   . Cataracts, bilateral   . Psoriasis   . Fungal infection     Finger nails  . Pneumonia   . Dizziness     Positional  . Hypercholesterolemia   . PONV (postoperative nausea and vomiting)     no problem after cataract surgery  . Shortness of breath dyspnea     wheezing  . Headache     seasonal allergies  . Arthritis     osteo  . Nephrolithiasis     kidney stones  . Renal stone   . DDD (degenerative disc disease), cervical     also back  . Sleep apnea 2012    sleep study / slight, no interventions     Social History   Social History  . Marital Status: Divorced    Spouse Name: N/A  . Number of Children: 1  .  Years of Education: N/A   Occupational History  . Customer Service Rep at [employer]  . Smoking status: Former Smoker -- 0.50 packs/day for 25 years    Types: Cigarettes    Quit date: 11/16/2012  . Smokeless tobacco: Never Used     Comment: recenty 04/26/2014  . Alcohol Use: No  . Drug Use: No  . Sexual Activity: No   Other Topics Concern  . Not on file   Social History Narrative    Past Surgical History  Procedure Laterality Date  . Lithotripsy    . Cataract extraction w/phaco Left 05/21/2015    Procedure: CATARACT EXTRACTION PHACO AND INTRAOCULAR LENS PLACEMENT (IOC);  Surgeon: Birder Robson, MD;  Location: [facility];  Service: Ophthalmology;  Laterality: Left;  [place] 00:35 AP%  22.9 CDE 8.11 fluid pack lot [id number]  . Foot surgery  2015  . Cataract extraction w/phaco Right 06/04/2015    Procedure: CATARACT EXTRACTION PHACO AND INTRAOCULAR LENS PLACEMENT (IOC);  Surgeon: Birder Robson, MD;  Location: [facility];  Service: Ophthalmology;  Laterality: Right;  US:00:48 AP%: 10.5 CDE:5.08 Fluid lot [id number]  . Tonsillectomy  1973  . Eye surgery  2015    tissue biopsy  . Joint replacement Left 2013    hip replacement  . Thumb surgery Right   . Ptosis repair Bilateral 02/18/2016    Procedure: BILATERAL PTOSIS REPAIR UPPER EYELIDS;  Surgeon: Karle Starch, MD;  Location: [place];  Service: Ophthalmology;  Laterality: Bilateral;  LEAVE PT EARLY AM    Family History  Problem Relation Age of Onset  . Cancer Father     Allergies  Allergen Reactions  . Albuterol Shortness Of Breath and Other (See Comments)    Makes pt feel hyped up/ tacycardic  . Tylenol [Acetaminophen] Swelling  . Ultram [Tramadol Hcl] Itching and Nausea And Vomiting    dizzyness  . Aripiprazole Other (See Comments)    Pt not aware  . Cefaclor     Doesn't remember  . Diclofenac Sodium     Doesn't remember  . Halcion [Triazolam] Other (See Comments)    Dizziness,headaches,bladder problems  . Ibuprofen Swelling    Can take small amounts  . Levaquin [Levofloxacin In D5w] Diarrhea and Itching    Shoulder pain  . Naproxen Sodium Swelling  . Sulfa Antibiotics     Doesn't remember  . Voltaren [Diclofenac]     Current Outpatient Prescriptions on File Prior to Visit  Medication Sig Dispense Refill  . ASPERCREME LIDOCAINE EX Apply topically.    Marland Kitchen aspirin 325 MG tablet Take 325 mg by mouth as needed for headache.     Marland Kitchen BIOTIN PO Take 5,000 mg by mouth daily.     . calcipotriene-betamethasone (TACLONEX) ointment Apply topically daily. 60 g 0  . calcium carbonate (TUMS - DOSED IN MG ELEMENTAL CALCIUM) 500 MG chewable tablet Chew 2 tablets by mouth daily.     . cetirizine (ZYRTEC)  10 MG tablet Take 10 mg by mouth daily. pm    . Cholecalciferol (VITAMIN D-3 PO) Take 10,000 Units by mouth daily.     . ciclopirox (PENLAC) 8 % solution Apply nightly over nail and surrounding skin. Apply daily over prior coat. After 7 days, may remove with alcohol and continue 6.6 mL 3  . clobetasol cream (TEMOVATE) [id number] % Apply 1 application topically 2 (two) times daily. 30 g 2  . diclofenac sodium (VOLTAREN) 1 % GEL Apply 4 g  topically 4 (four) times daily. (Patient taking differently: Apply 4 g topically as needed. ) 500 g 5  . DULoxetine (CYMBALTA) 20 MG capsule Take 40 mg by mouth daily. pm  4  . erythromycin (ROMYCIN) ophthalmic ointment Use a small amount on your sutures 4 times a day for the next 2 weeks. Switch to Aquaphor ointment should allergy develop. 3.5 g 3  . fluticasone (FLONASE) 50 MCG/ACT nasal spray Place 1 spray into both nostrils daily.    . Garcinia Cambogia-Chromium 500-200 MG-MCG TABS Take 1 tablet by mouth 2 (two) times daily. Reported on 02/12/2016    . hydroquinone 4 % cream Reported on 02/18/2016  3  . hydrOXYzine (VISTARIL) 50 MG capsule Take 100 mg by mouth at bedtime.    . lamoTRIgine (LAMICTAL) 150 MG tablet Take two tablets by mouth daily (Patient taking differently: Take two tablets by mouth daily/ pm) 180 tablet 3  . levalbuterol (XOPENEX HFA) 45 MCG/ACT inhaler Inhale 1-2 puffs into the lungs every 6 (six) hours as needed for wheezing. 1 Inhaler 5  . Lysine 1000 MG TABS Take 2,000 mg by mouth daily.     . NEOMYCIN-POLYMYXIN-HYDROCORTISONE (CORTISPORIN) 1 % SOLN otic solution Apply 1-2 drops to toe BID after soaking 10 mL 1  . nicotine (NICODERM CQ - DOSED IN MG/24 HOURS) 21 mg/24hr patch Place 1 patch (21 mg total) onto the skin daily. 28 patch 3  . Omega-3 Fatty Acids (FISH OIL PO) Take 4 capsules by mouth at bedtime.     Marland Kitchen omeprazole (PRILOSEC) 20 MG capsule Take 20 mg by mouth as needed.    . Phenazopyridine HCl (AZO TABS PO) Take 2 tablets by mouth as  needed.     . polyethylene glycol powder (GLYCOLAX/MIRALAX) powder MIX 17 GRAMS (1 CAPFUL) WITH 4-8 OZ OF LIQUID AND TAKE BY MOUTH TWICE DAILY AS NEEDED 527 g 0  . Probiotic Product (PROBIOTIC PO) Take 1 tablet by mouth daily. Reported on 02/12/2016    . ranitidine (ZANTAC) 150 MG tablet Take 150 mg by mouth 2 (two) times daily as needed for heartburn. Reported on 02/12/2016    . tamsulosin (FLOMAX) 0.4 MG CAPS capsule Take 1 capsule (0.4 mg total) by mouth daily. (Patient taking differently: Take 0.4 mg by mouth daily. pm) 30 capsule 3  . temazepam (RESTORIL) 30 MG capsule TAKE ONE TO TWO CAPSULES BY MOUTH NIGHTLY AT BEDTIME AS NEEDED  60 capsule 0  . tiZANidine (ZANAFLEX) 4 MG tablet Take 2 tablets (8 mg total) by mouth 3 (three) times daily. (Patient taking differently: Take 8 mg by mouth 3 (three) times daily. pm) 180 tablet 5  . zolpidem (AMBIEN) 10 MG tablet Take 10 mg by mouth at bedtime as needed for sleep.     No current facility-administered medications on file prior to visit.    BP 122/76 mmHg  Pulse 104  Temp(Src) 99.4 F (37.4 C) (Oral)  Ht 5\' 9"  (1.753 m)  Wt 215 lb 12.8 oz (97.886 kg)  BMI 31.85 kg/m2  SpO2 98%  LMP 08/23/2014    Objective:   Physical Exam  Constitutional: She is oriented to person, place, and time. She appears well-nourished.  Eyes: Pupils are equal, round, and reactive to light.  Neck: Neck supple.  Cardiovascular: Normal rate and regular rhythm.   Pulmonary/Chest: Effort normal and breath sounds normal.  Neurological: She is alert and oriented to person, place, and time. No cranial nerve deficit.  Skin: Skin is warm and dry.  Psychiatric:  She has a normal mood and affect.          Assessment & Plan:

## 2016-03-16 NOTE — Assessment & Plan Note (Signed)
History of in the past, most recent episode yesterday that occurred for 30 seconds after rising. BP stable today. Will obtain labs to rule out metabolic cause.  Likely due to numerous medications and dehydration. Will continue to monitor.  Exam unremarkable.

## 2016-03-16 NOTE — Assessment & Plan Note (Signed)
Refill provided of hydrocodone for back and hip pain. Patient also scheduled to see PT via [facility].

## 2016-03-16 NOTE — Patient Instructions (Signed)
Schedule a lab only appointment tomorrow.  You will be contacted regarding your referral to the nutritionist.  Please let us know if you have not heard back within one week.   It was a pleasure to see you today!

## 2016-03-16 NOTE — Assessment & Plan Note (Signed)
Poor diet consisting of fast food and junk. Does not exercise. Recommendations provided today. Referral to nutritionist provided.

## 2016-03-18 ENCOUNTER — Encounter: Payer: Self-pay | Admitting: Physical Therapy

## 2016-03-18 ENCOUNTER — Ambulatory Visit: Payer: PPO | Attending: Family Medicine

## 2016-03-18 DIAGNOSIS — M545 Low back pain, unspecified: Secondary | ICD-10-CM

## 2016-03-18 DIAGNOSIS — M25552 Pain in left hip: Secondary | ICD-10-CM

## 2016-03-18 NOTE — Therapy (Signed)
[place] MAIN [facility] SERVICES [address] [place], [place], [address] Phone: [phone]   Fax:  [phone]  Physical Therapy Evaluation  Patient Details  Name: Emily Moon MRN: [id number] Date of Birth: 07/05/1961 Referring Provider: Edilia Bo  Encounter Date: 03/18/2016      PT End of Session - 03/18/16 1253    Visit Number 1   Number of Visits 17   Date for PT Re-Evaluation 05/13/16   Authorization Type 1/10   PT Start Time 1025   PT Stop Time 1115   PT Time Calculation (min) 50 min   Activity Tolerance Patient tolerated treatment well   Behavior During Therapy [facility] for tasks assessed/performed      Past Medical History  Diagnosis Date  . Bipolar disorder ([place]) 05/21/14  . Multiple sclerosis (HCC)     weakness  . Depression   . GERD (gastroesophageal reflux disease)   . Osteoporosis     osteoarthritis  . Fibromyalgia syndrome   . 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 Heart palpitations   . Edema     feet/legs  . Urinary frequency   . Cataracts, bilateral   . Psoriasis   . Fungal infection     Finger nails  . Pneumonia   . Dizziness     Positional  . Hypercholesterolemia   . PONV (postoperative nausea and vomiting)     no problem after cataract surgery  . Shortness of breath dyspnea     wheezing  . Headache     seasonal allergies  . Arthritis     osteo  . Nephrolithiasis     kidney stones  . Renal stone   . DDD (degenerative disc disease), cervical     also back  . Sleep apnea 2012    sleep study / slight, no interventions    Past Surgical History  Procedure Laterality Date  . Lithotripsy    . Cataract extraction w/phaco Left 05/21/2015    Procedure: CATARACT EXTRACTION PHACO AND INTRAOCULAR LENS PLACEMENT (IOC);  Surgeon: Birder Robson, MD;  Location: [facility];  Service: Ophthalmology;  Laterality: Left;  [place] 00:35 AP% 22.9 CDE 8.11 fluid pack lot [id number]  . Foot surgery  2015  . Cataract  extraction w/phaco Right 06/04/2015    Procedure: CATARACT EXTRACTION PHACO AND INTRAOCULAR LENS PLACEMENT (IOC);  Surgeon: Birder Robson, MD;  Location: [facility];  Service: Ophthalmology;  Laterality: Right;  US:00:48 AP%: 10.5 CDE:5.08 Fluid lot [id number]  . Tonsillectomy  1973  . Eye surgery  2015    tissue biopsy  . Joint replacement Left 2013    hip replacement  . Thumb surgery Right   . Ptosis repair Bilateral 02/18/2016    Procedure: BILATERAL PTOSIS REPAIR UPPER EYELIDS;  Surgeon: Karle Starch, MD;  Location: [place];  Service: Ophthalmology;  Laterality: Bilateral;  LEAVE PT EARLY AM    There were no vitals filed for this visit.       Subjective Assessment - 03/18/16 1033    Subjective pt reports having L hip pain since 2013 where she had a THA. she has had multiple falls and had to care for her mother which including helping lift her. she reports having a reduction in activity since that time. she has had the hip Xrayed which was normal s/p THA. she reports pain in both hips, L>R as well as LBP.  PT reports having some tingling in the feet.    Pertinent History bi-polar disorder, history  of L THA  2013, chronic pain/fibromyalgia, MS, lower leg nuumbness   How long can you sit comfortably? 30 with "right chair"   Patient Stated Goals reduce hip pain, balance    Currently in Pain? Yes   Pain Score 2    Pain Location Hip   Pain Orientation Left   Pain Descriptors / Indicators Aching   Aggravating Factors  sitting too long or moving too much            [facility] PT Assessment - 03/18/16 1036    Assessment   Medical Diagnosis L hip pain   Referring Provider [place]   Onset Date/Surgical Date 03/18/12   Precautions   Precautions Fall  MS   Restrictions   Weight Bearing Restrictions No   Balance Screen   Has the patient fallen in the past 6 months Yes   How many times? 3   Has the patient had a decrease in activity level because of a fear of falling?  Yes    Is the patient reluctant to leave their home because of a fear of falling?  No   Home Environment   Living Environment Private residence   Living Arrangements Other relatives  sister   Type of [place] to enter   Entrance Stairs-Number of Steps 1   [place] - single point   Prior Function   Level of Independence Independent;Independent with community mobility without device  pt having increased pain/impaired balance.          POSTURE/OBSERVATION: Pt in no acute distress. Good sitting posture.   PROM/AROM: Pt has PROM WFL- c/o pain with L hip flexion, IR/ER no capsular restriction noted until end ranges.  Pt reports pain to posterolateral hip.     STRENGTH:  Graded on a 0-5 scale Muscle Group Left Right  Shoulder flex    Shoulder Abd    Shoulder Ext    Shoulder IR/ER    Elbow    Wrist/hand     Hip Flex 4- 4-  Hip Abd 4- 4-  Hip Add 4- 4  Hip Ext 4- 4-  Hip IR/ER    Knee Flex 4 5  Knee Ext 4 4+  Ankle DF 4 4  Ankle PF 4 4   SENSATION: Reduced light touch sensation below the knees L>R  SPECIAL TESTS: Corky Sox (-) SLR (-) Obers (+) L Prone quad stretch impaired flexibility BLE Tight piriformis (L)   BALANCE: (+) [place]  GAIT:  Pt has reduced step length, reduced hip extension, reduced pelvic rotation, reduced stance time / antalgia on the L                    PT Education - 03/18/16 1252    Education provided Yes   Education Details TNE, PT exam findings POC, recommendation for regular exercise program/ aquatics   Person(s) Educated Patient   Methods Explanation   Comprehension Verbalized understanding             PT Long Term Goals - 03/18/16 1300    PT LONG TERM GOAL #1   Title pt will be indepdendent and compliant with HEP for continued strengthening   Time 8   Period Weeks   Status New   PT LONG TERM GOAL #2   Title pt will be able to sit for 1 hr without increased hip pain    Time  8   Period Weeks   Status New  PT LONG TERM GOAL #3   Title pt will ambulate 1.76m/s for normalized community mobility   Time 8   Period Weeks   Status New               Plan - 03-Apr-2016 1254    Clinical Impression Statement pt presents with history of MS, Fibromyalgia, chronic pain especially of the lower back, hips and neck. pt had L THA in 2013, which went well but reports redution in activity due to various factors and now has pain in the area. pt reports having multiple falls as well recently. pt demonstrates impaired LE especially the hips. pt was also reporting pain with gentle hip PROM to the lateral hip and areas where PT hand placement is suggesting hypersensitivity of the nervous system. pt was educated with theraputic neruoscience education whicih she was accepting of. pt would benefit from skilled PT services to address pain, flexibility, ROM and balance to maximize her function, reduce fall risk and begin a regular exercise program. 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 pt requests limiting PT visits to  4 total due to financial reasons. PT primary goals will be independent land and aquatic HEP for self management of her symptoms.    Rehab Potential Fair   Clinical Impairments Affecting Rehab Potential fibromyalgia/ chronic pain, L THA, bipolar disorder, MS, little home support   PT Frequency 2x / week   PT Duration 8 weeks   PT Treatment/Interventions Aquatic Therapy;Patient/family education;Neuromuscular re-education;Balance training;Therapeutic exercise;Therapeutic activities;Functional mobility training;Stair training;Gait training;Manual techniques;Energy conservation      Patient will benefit from skilled therapeutic intervention in order to improve the following deficits and impairments:     Visit Diagnosis: Pain in left hip - Plan: PT plan of care cert/re-cert  Midline low back pain without sciatica - Plan: PT plan of care cert/re-cert       G-Codes - 03-Apr-2016 1433    Functional Assessment Tool Used 13mwalk/ clinical judgement, NRPS   Functional Limitation Mobility: Walking and moving around   Mobility: Walking and Moving Around Current Status [id number]) At least 20 percent but less than 40 percent impaired, limited or restricted   Mobility: Walking and Moving Around Goal Status [phone]) At least 1 percent but less than 20 percent impaired, limited or restricted       Problem List Patient Active Problem List   Diagnosis Date Noted  . Dizziness 03/16/2016  . Back pain 10/31/2014  . Bipolar disorder ([place]) 05/21/2014  . 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 Insomnia 09/01/2011  . Obesity 05/04/2011  . Perimenopausal vasomotor symptoms 02/05/2011  . HLD (hyperlipidemia) 01/20/2011  . SMOKER 01/20/2011  . Chronic pain syndrome 01/07/2011  . RENAL CALCULUS, RECURRENT 11/13/2010  . MULTIPLE SCLEROSIS, PROGRESSIVE/RELAPSING 08/28/2010  . GERD 08/28/2010  . Osteoarthritis 08/28/2010  . NEPHROLITHIASIS, HX OF 08/28/2010   Gorden Harms. Mylan Schwarz, PT, DPT [phone]  Gaither Biehn 04-03-16, 2:36 PM  [place] MAIN [facility] SERVICES [address] [place], [place], [address] Phone: [phone]   Fax:  [phone]  Name: Emily Moon MRN: [id number] Date of Birth: 1961-09-01

## 2016-03-20 ENCOUNTER — Other Ambulatory Visit: Payer: Self-pay | Admitting: Primary Care

## 2016-03-23 ENCOUNTER — Other Ambulatory Visit: Payer: Self-pay

## 2016-03-23 ENCOUNTER — Encounter: Payer: Self-pay | Admitting: Physical Therapy

## 2016-03-24 ENCOUNTER — Telehealth: Payer: Self-pay

## 2016-03-24 ENCOUNTER — Other Ambulatory Visit (INDEPENDENT_AMBULATORY_CARE_PROVIDER_SITE_OTHER): Payer: PPO

## 2016-03-24 DIAGNOSIS — E785 Hyperlipidemia, unspecified: Secondary | ICD-10-CM

## 2016-03-24 DIAGNOSIS — R739 Hyperglycemia, unspecified: Secondary | ICD-10-CM

## 2016-03-24 DIAGNOSIS — R42 Dizziness and giddiness: Secondary | ICD-10-CM | POA: Diagnosis not present

## 2016-03-24 DIAGNOSIS — E559 Vitamin D deficiency, unspecified: Secondary | ICD-10-CM | POA: Diagnosis not present

## 2016-03-24 LAB — COMPREHENSIVE METABOLIC PANEL
ALBUMIN: 4.3 g/dL (ref 3.5–5.2)
ALK PHOS: 66 U/L (ref 39–117)
ALT: 13 U/L (ref 0–35)
AST: 13 U/L (ref 0–37)
BUN: 15 mg/dL (ref 6–23)
CHLORIDE: 101 meq/L (ref 96–112)
CO2: 31 mEq/L (ref 19–32)
CREATININE: 0.77 mg/dL (ref 0.40–1.20)
Calcium: 9.1 mg/dL (ref 8.4–10.5)
GFR: 83.04 mL/min (ref >60.00)
GLUCOSE: 101 mg/dL — AB (ref 70–99)
POTASSIUM: 3.9 meq/L (ref 3.5–5.1)
SODIUM: 139 meq/L (ref 135–145)
TOTAL PROTEIN: 6.8 g/dL (ref 6.0–8.3)
Total Bilirubin: 0.4 mg/dL (ref 0.2–1.2)

## 2016-03-24 LAB — LIPID PANEL
Cholesterol: 327 mg/dL — ABNORMAL HIGH (ref 0–200)
HDL: 46.6 mg/dL (ref >39.00)
NONHDL: 280.88
Total CHOL/HDL Ratio: 7
Triglycerides: 340 mg/dL — ABNORMAL HIGH (ref 0.0–149.0)
VLDL: 68 mg/dL — AB (ref 0.0–40.0)

## 2016-03-24 LAB — VITAMIN D 25 HYDROXY (VIT D DEFICIENCY, FRACTURES): VITD: 57.52 ng/mL (ref 30.00–100.00)

## 2016-03-24 LAB — VITAMIN B12: VITAMIN B 12: 159 pg/mL — AB (ref 211–911)

## 2016-03-24 LAB — HEMOGLOBIN A1C: HEMOGLOBIN A1C: 6 % (ref 4.6–6.5)

## 2016-03-24 LAB — LDL CHOLESTEROL, DIRECT: Direct LDL: 208 mg/dL

## 2016-03-24 NOTE — Telephone Encounter (Signed)
Her recent labs indicate that she is prediabetic with cholesterol that is very high. I will send separate result note with my comments for her labs later this week. A nutritionist would be helpful in her case. Please have her notify her insurance company of this news and see if they will cover for prediabetes. I'm not sure what she means by a provider hot line.

## 2016-03-24 NOTE — Telephone Encounter (Signed)
Pt left v/m; pt was to go to nutritionist; pt spoke with nutritionist and was not sure if ins covered this referral; pt spoke with ins and only covered if pt was diabetic or had renal failure; pt wants to see nutritionist but cannot pay out of pocket and request call to provider hot line to get nutritionist approved (pt did not have provider hot line). Pt request cb.

## 2016-03-25 ENCOUNTER — Encounter: Payer: Self-pay | Admitting: Physical Therapy

## 2016-03-25 NOTE — Telephone Encounter (Signed)
Noted. Result note sent with recent labs and recommendations.

## 2016-03-25 NOTE — Telephone Encounter (Signed)
Patient notified as instructed by telephone and verbalized understanding. Patient stated that she spoke with someone at her insurance company and was told that they will not cover a nutritionist unless she has diabetes and it cost $300 for this service. Patient stated that she thought that if Anda Kraft would call the insurance company she may be able to tell them something that would change their mind. Patient stated that she will wait and see what her next lab results are. Patient stated that she does not have a number, but Anda Kraft can call [place] and she should have to number to discuss this with them. Patient stated to forget about it at this time and will wait for next lab results.

## 2016-03-26 ENCOUNTER — Encounter: Payer: Self-pay | Admitting: *Deleted

## 2016-03-26 ENCOUNTER — Ambulatory Visit: Payer: PPO | Admitting: Physical Therapy

## 2016-03-26 DIAGNOSIS — M545 Low back pain, unspecified: Secondary | ICD-10-CM

## 2016-03-26 DIAGNOSIS — M25552 Pain in left hip: Secondary | ICD-10-CM

## 2016-03-26 NOTE — Therapy (Signed)
[place] MAIN [facility] SERVICES [address] [place], [place], [address] Phone: [phone]   Fax:  [phone]  Physical Therapy Treatment  Patient Details  Name: Emily Moon MRN: [id number] Date of Birth: 1961/10/12 Referring Provider: Edilia Bo  Encounter Date: 03/26/2016    Past Medical History  Diagnosis Date  . Bipolar disorder ([place]) 05/21/14  . Multiple sclerosis (HCC)     weakness  . Depression   . GERD (gastroesophageal reflux disease)   . Osteoporosis     osteoarthritis  . Fibromyalgia syndrome   . 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 Heart palpitations   . Edema     feet/legs  . Urinary frequency   . Cataracts, bilateral   . Psoriasis   . Fungal infection     Finger nails  . Pneumonia   . Dizziness     Positional  . Hypercholesterolemia   . PONV (postoperative nausea and vomiting)     no problem after cataract surgery  . Shortness of breath dyspnea     wheezing  . Headache     seasonal allergies  . Arthritis     osteo  . Nephrolithiasis     kidney stones  . Renal stone   . DDD (degenerative disc disease), cervical     also back  . Sleep apnea 2012    sleep study / slight, no interventions    Past Surgical History  Procedure Laterality Date  . Lithotripsy    . Cataract extraction w/phaco Left 05/21/2015    Procedure: CATARACT EXTRACTION PHACO AND INTRAOCULAR LENS PLACEMENT (IOC);  Surgeon: Birder Robson, MD;  Location: [facility];  Service: Ophthalmology;  Laterality: Left;  [place] 00:35 AP% 22.9 CDE 8.11 fluid pack lot [id number]  . Foot surgery  2015  . Cataract extraction w/phaco Right 06/04/2015    Procedure: CATARACT EXTRACTION PHACO AND INTRAOCULAR LENS PLACEMENT (IOC);  Surgeon: Birder Robson, MD;  Location: [facility];  Service: Ophthalmology;  Laterality: Right;  US:00:48 AP%: 10.5 CDE:5.08 Fluid lot [id number]  . Tonsillectomy  1973  . Eye surgery  2015    tissue biopsy  . Joint  replacement Left 2013    hip replacement  . Thumb surgery Right   . Ptosis repair Bilateral 02/18/2016    Procedure: BILATERAL PTOSIS REPAIR UPPER EYELIDS;  Surgeon: Karle Starch, MD;  Location: [place];  Service: Ophthalmology;  Laterality: Bilateral;  LEAVE PT EARLY AM    There were no vitals filed for this visit.      Subjective Assessment - 03/26/16 0918    Subjective Pt reported she has not been drinking alot of water but she did bring in a water bottle today. Pt notices her balance is not that great. 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 Pt did not like the [employer]. Pt would like to learn yoga.    Pertinent History bi-polar disorder, history of L THA  2013, chronic pain/fibromyalgia, MS, lower leg nuumbness   How long can you sit comfortably? 30 with "right chair"   Patient Stated Goals reduce hip pain, balance                      Adult Aquatic Therapy - 03/26/16 0922    Aquatic Therapy Subjective   Subjective Pt tolerated today's session without complaints       O:  O: Pt entered/exited the pool via steps with single UE support on rail. Exercises performed in  3'6" depth  50 ft =1 lap  2 laps: walking  w/ blue pool floor for proprioception 2 laps  Sidestep + squat L / R with cuing for LE alignment and application in ADLs (pulling out low drawer, getting something out of fridge, bending)   Figure -4 stretch w/ BUE support on wall , SLS 5 breaths L,R  3-way hip with single  UE support on wall , cued for decreased lumbar lordosis, less ROM to isolate in hip joint 30 sec on SLS R, 60 sec on SLS L   2 laps marching with hand on thigh, noted good balance  4'6" depth  Noodles under armpit: Backward walking 2 laps    Relaxation on noodles , floating 5'. Explained the importance of activity pacing to manage fatigue, exhaustion                   PT Long Term Goals - 03/18/16 1300    PT LONG TERM GOAL #1   Title pt will be  indepdendent and compliant with HEP for continued strengthening   Time 8   Period Weeks   Status New   PT LONG TERM GOAL #2   Title pt will be able to sit for 1 hr without increased hip pain    Time 8   Period Weeks   Status New   PT LONG TERM GOAL #3   Title pt will ambulate 1.30m/s for normalized community mobility   Time 8   Period Weeks   Status New               Plan - 03/26/16 [id number]    Clinical Impression Statement Pt tolerated today's session without complaints and showed good concentration with dynamic and static balance exercises. Pt was educated about activity pacing and increasing water intake with bladder schedule to decrease urgency with neurogenic bladder. Pt voiced understanding. PT provided two other locations for community pools as options for pt to check out because she did not like [place] pool. Pt expressed she would like to learn yoga and incorporate that into her home practice. Pt will continue to benefit from skilled PT.       Rehab Potential Fair   Clinical Impairments Affecting Rehab Potential fibromyalgia/ chronic pain, L THA, bipolar disorder, MS, little home support   PT Frequency 2x / week   PT Duration 8 weeks   PT Treatment/Interventions Aquatic Therapy;Patient/family education;Neuromuscular re-education;Balance training;Therapeutic exercise;Therapeutic activities;Functional mobility training;Stair training;Gait training;Manual techniques;Energy conservation      Patient will benefit from skilled therapeutic intervention in order to improve the following deficits and impairments:     Visit Diagnosis: Pain in left hip  Midline low back pain without sciatica     Problem List Patient Active Problem List   Diagnosis Date Noted  . Dizziness 03/16/2016  . Back pain 10/31/2014  . Bipolar disorder ([place]) 05/21/2014  . 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 Insomnia 09/01/2011  . Obesity 05/04/2011  . Perimenopausal vasomotor symptoms 02/05/2011  . HLD (hyperlipidemia) 01/20/2011  . SMOKER 01/20/2011  . Chronic pain syndrome 01/07/2011  . RENAL CALCULUS, RECURRENT 11/13/2010  . MULTIPLE SCLEROSIS, PROGRESSIVE/RELAPSING 08/28/2010  . GERD 08/28/2010  . Osteoarthritis 08/28/2010  . NEPHROLITHIASIS, HX OF 08/28/2010    Jerl Mina ,PT, DPT, E-RYT  03/26/2016, 9:29 AM  [place] MAIN [facility] SERVICES [place], [place],  [place] Phone: [phone]   Fax:  [phone]  Name: Emily Moon MRN: [id number] Date of Birth: 07/31/1961

## 2016-03-31 ENCOUNTER — Ambulatory Visit: Payer: Self-pay | Admitting: Dietician

## 2016-04-06 ENCOUNTER — Other Ambulatory Visit: Payer: Self-pay

## 2016-04-06 DIAGNOSIS — M1990 Unspecified osteoarthritis, unspecified site: Secondary | ICD-10-CM

## 2016-04-06 NOTE — Telephone Encounter (Signed)
Pt left v/m requesting rx hydrocodone apap. Call when ready for pick up. Pt last seen and rx last printed # 90 on 03/16/16. Pt request to pick up rx on 04/08/16 because pt will be going out of town on 04/09/16 and not return home until 04/21/16. Please advise.

## 2016-04-07 ENCOUNTER — Ambulatory Visit: Payer: PPO | Admitting: Physical Therapy

## 2016-04-07 DIAGNOSIS — M25552 Pain in left hip: Secondary | ICD-10-CM

## 2016-04-07 DIAGNOSIS — M545 Low back pain, unspecified: Secondary | ICD-10-CM

## 2016-04-07 MED ORDER — HYDROCODONE-ACETAMINOPHEN 10-325 MG PO TABS: 1.0000 | ORAL_TABLET | Freq: Four times a day (QID) | ORAL | Status: DC | PRN

## 2016-04-07 NOTE — Telephone Encounter (Signed)
Spoken and notified patient that Rx is ready for pick up. Patient verbalized understanding.

## 2016-04-08 NOTE — Therapy (Signed)
[place] MAIN [facility] SERVICES [address] [place], [place], [address] Phone: [phone]   Fax:  [phone]  Physical Therapy Treatment  Patient Details  Name: Emily Moon MRN: [id number] Date of Birth: 1961/05/16 Referring Provider: Edilia Bo  Encounter Date: 04/07/2016      PT End of Session - 04/07/16 0958    Visit Number 3   Number of Visits 17   Date for PT Re-Evaluation 05/13/16   Authorization Type 2/10   PT Start Time 0945   PT Stop Time 1030   PT Time Calculation (min) 45 min   Activity Tolerance Patient tolerated treatment well   Behavior During Therapy [facility] for tasks assessed/performed      Past Medical History  Diagnosis Date  . Bipolar disorder ([place]) 05/21/14  . Multiple sclerosis (HCC)     weakness  . Depression   . GERD (gastroesophageal reflux disease)   . Osteoporosis     osteoarthritis  . Fibromyalgia syndrome   . 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 Heart palpitations   . Edema     feet/legs  . Urinary frequency   . Cataracts, bilateral   . Psoriasis   . Fungal infection     Finger nails  . Pneumonia   . Dizziness     Positional  . Hypercholesterolemia   . PONV (postoperative nausea and vomiting)     no problem after cataract surgery  . Shortness of breath dyspnea     wheezing  . Headache     seasonal allergies  . Arthritis     osteo  . Nephrolithiasis     kidney stones  . Renal stone   . DDD (degenerative disc disease), cervical     also back  . Sleep apnea 2012    sleep study / slight, no interventions    Past Surgical History  Procedure Laterality Date  . Lithotripsy    . Cataract extraction w/phaco Left 05/21/2015    Procedure: CATARACT EXTRACTION PHACO AND INTRAOCULAR LENS PLACEMENT (IOC);  Surgeon: Birder Robson, MD;  Location: [facility];  Service: Ophthalmology;  Laterality: Left;  [place] 00:35 AP% 22.9 CDE 8.11 fluid pack lot [id number]  . Foot surgery  2015  . Cataract  extraction w/phaco Right 06/04/2015    Procedure: CATARACT EXTRACTION PHACO AND INTRAOCULAR LENS PLACEMENT (IOC);  Surgeon: Birder Robson, MD;  Location: [facility];  Service: Ophthalmology;  Laterality: Right;  US:00:48 AP%: 10.5 CDE:5.08 Fluid lot [id number]  . Tonsillectomy  1973  . Eye surgery  2015    tissue biopsy  . Joint replacement Left 2013    hip replacement  . Thumb surgery Right   . Ptosis repair Bilateral 02/18/2016    Procedure: BILATERAL PTOSIS REPAIR UPPER EYELIDS;  Surgeon: Karle Starch, MD;  Location: [place];  Service: Ophthalmology;  Laterality: Bilateral;  LEAVE PT EARLY AM    There were no vitals filed for this visit.      Subjective Assessment - 04/07/16 0958    Subjective Pt had no fatigue after last session.   Pertinent History bi-polar disorder, history of L THA  2013, chronic pain/fibromyalgia, MS, lower leg nuumbness   How long can you sit comfortably? 30 with "right chair"   Patient Stated Goals reduce hip pain, balance                      Adult Aquatic Therapy - 04/08/16 1257    Aquatic Therapy Subjective  Subjective Pt tolerated today's session without complaints       O:  O: Pt entered/exited the pool via steps with single UE support on rail. Exercises performed in 4'6" depth  50 ft =1 lap 2 laps walking 2 laps walking with dumbbells in hand for shoulder depression 2 laps walking with kick board at lower ribs level 2 laps grape vine  Practiced yoga poses as requested by pt in  3'6" depth  With cuing for foot alignment and propioception at hips to foot, knee  [place] --> warrior II--> reverse warrior --> extended side angle  Sequence performed bilaterally 3x  Pt demo'd less cuing by 3rd trial    5' relaxation                      PT Long Term Goals - 03/18/16 1300    PT LONG TERM GOAL #1   Title pt will be indepdendent and compliant with HEP for continued strengthening   Time 8   Period  Weeks   Status New   PT LONG TERM GOAL #2   Title pt will be able to sit for 1 hr without increased hip pain    Time 8   Period Weeks   Status New   PT LONG TERM GOAL #3   Title pt will ambulate 1.23m/s for normalized community mobility   Time 8   Period Weeks   Status New               Plan - 04/07/16 0959    Clinical Impression Statement Pt tolerated pool therapy session without complaints. Pt learned aerobic exercises as well as yoga poses for balance and showed with less difficulty after multiple trials and cuing. Pt will continue to benefit from skilled PT.    Rehab Potential Fair   Clinical Impairments Affecting Rehab Potential fibromyalgia/ chronic pain, L THA, bipolar disorder, MS, little home support   PT Frequency 2x / week   PT Duration 8 weeks   PT Treatment/Interventions Aquatic Therapy;Patient/family education;Neuromuscular re-education;Balance training;Therapeutic exercise;Therapeutic activities;Functional mobility training;Stair training;Gait training;Manual techniques;Energy conservation      Patient will benefit from skilled therapeutic intervention in order to improve the following deficits and impairments:     Visit Diagnosis: Midline low back pain without sciatica  Pain in left hip     Problem List Patient Active Problem List   Diagnosis Date Noted  . Dizziness 03/16/2016  . Back pain 10/31/2014  . Bipolar disorder ([place]) 05/21/2014  . 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 Insomnia 09/01/2011  . Obesity 05/04/2011  . Perimenopausal vasomotor symptoms 02/05/2011  . HLD (hyperlipidemia) 01/20/2011  . SMOKER 01/20/2011  . Chronic pain syndrome 01/07/2011  . RENAL CALCULUS, RECURRENT 11/13/2010  . MULTIPLE SCLEROSIS, PROGRESSIVE/RELAPSING 08/28/2010  . GERD 08/28/2010  . Osteoarthritis 08/28/2010  . NEPHROLITHIASIS, HX OF 08/28/2010     Jerl Mina ,PT, DPT, E-RYT  04/08/2016, 12:58 PM  [place] MAIN [facility] SERVICES [address] [place], [place], [address] Phone: [phone]   Fax:  [phone]  Name: Emily Moon MRN: [id number] Date of Birth: 10/11/1961

## 2016-04-23 ENCOUNTER — Telehealth: Payer: Self-pay | Admitting: Physical Therapy

## 2016-04-23 ENCOUNTER — Ambulatory Visit (INDEPENDENT_AMBULATORY_CARE_PROVIDER_SITE_OTHER): Payer: PPO | Admitting: Family Medicine

## 2016-04-23 ENCOUNTER — Encounter: Payer: Self-pay | Admitting: Family Medicine

## 2016-04-23 VITALS — BP 118/76 | HR 99 | Temp 99.1°F | Wt 213.5 lb

## 2016-04-23 DIAGNOSIS — R300 Dysuria: Secondary | ICD-10-CM | POA: Diagnosis not present

## 2016-04-23 LAB — POC URINALSYSI DIPSTICK (AUTOMATED)
BILIRUBIN UA: NEGATIVE
GLUCOSE UA: NEGATIVE
KETONES UA: NEGATIVE
LEUKOCYTES UA: NEGATIVE
NITRITE UA: NEGATIVE
PH UA: 6
Protein, UA: NEGATIVE
Spec Grav, UA: 1.015
Urobilinogen, UA: 0.2

## 2016-04-23 MED ORDER — CALCIPOTRIENE-BETAMETH DIPROP 0.005-0.064 % EX OINT: TOPICAL_OINTMENT | Freq: Every day | CUTANEOUS | Status: DC

## 2016-04-23 NOTE — Assessment & Plan Note (Signed)
Unclear if related to B12 or UTI.  Hold B12 for now, this can be restarted later depending on clinical course.  ucx pending.  Can use azo and muscle relaxer for now, with inc in PO fluids.   If ucx pos, then treat.  If ucx neg, and if sx better of B12, then consider IM vs lower oral dose.  If ucx neg, and sx not better, then likely not UTI or B12 related and she may need further bladder eval (OAB, outlet narrowing, etc). Routed to PCP as FYI.  Okay for outpatient f/u.  No sign on stone, with absence of flank pain.  No abx started yet.

## 2016-04-23 NOTE — Patient Instructions (Addendum)
Stop B12 for now.  Try the AZO again with the muscle relaxers.  We'll culture your urine in the meantime.  If that is positive, then we should start you on antibiotics.   Stay off the flomax for now.   Take care.  Glad to see you.

## 2016-04-23 NOTE — Telephone Encounter (Signed)
PT returned pt's call about r/s. Pt stated she would like to r/s in July. Pt also reported she had a fall last week 6/1 on the sidewalk. Pt fell onto her L side and she is fine today. Pt stated her L foot drags all the time and she shift to the R side "not knowingly" because her L side is weak. Pt reported her MS is getting worse but has no neurologist. Pt also stated she can not afford more PT appointments except for one more appt. PT explained to pt that PT will gather information about orthotics for her foot drag and will send her information about services at [facility] (research, [facility]). Pt's next appt is scheduled on 7/21@10am .

## 2016-04-23 NOTE — Progress Notes (Signed)
Pre visit review using our clinic review tool, if applicable. No additional management support is needed unless otherwise documented below in the visit note.  She needed a refill on TACLONEX. Done at [place].   Yesterday she had more urinary sx.  Had started a few days ago, then worse yesterday.  Has burning with urination.  No fevers, no temps >100.  No vomiting.  Some occ loose stools, not frank diarrhea.    B12 def.  Started replacement recently.  She had the urinary sx that started thereafter.  Unclear if related.    She took AZO yesterday w/o much help but it had helped some in the past.  Prev muscle relaxer use had helped with bladder pain. She is off flomax recently.    Meds, vitals, and allergies reviewed.   ROS: Per HPI unless specifically indicated in ROS section   nad Speech is quicker than typical conversation and tangential but she can be redirected and her insight appears to be intact rrr ctab abd soft, not ttp, normal BS Back w/o cva pain Ext w/o edema.

## 2016-04-24 LAB — URINE CULTURE
Colony Count: NO GROWTH
ORGANISM ID, BACTERIA: NO GROWTH

## 2016-04-27 ENCOUNTER — Telehealth: Payer: Self-pay | Admitting: Primary Care

## 2016-04-27 NOTE — Telephone Encounter (Signed)
Will do!

## 2016-04-27 NOTE — Telephone Encounter (Signed)
Patient returned Emily Moon's call.  Patient asked for Emily Moon to leave a detailed message on her voice mail, if she doesn't answer.

## 2016-04-28 DIAGNOSIS — F3162 Bipolar disorder, current episode mixed, moderate: Secondary | ICD-10-CM | POA: Diagnosis not present

## 2016-05-01 ENCOUNTER — Other Ambulatory Visit: Payer: Self-pay

## 2016-05-01 ENCOUNTER — Ambulatory Visit: Payer: PPO | Admitting: Physical Therapy

## 2016-05-01 DIAGNOSIS — M1990 Unspecified osteoarthritis, unspecified site: Secondary | ICD-10-CM

## 2016-05-01 MED ORDER — HYDROCODONE-ACETAMINOPHEN 10-325 MG PO TABS: 1.0000 | ORAL_TABLET | Freq: Four times a day (QID) | ORAL | Status: DC | PRN

## 2016-05-01 NOTE — Telephone Encounter (Signed)
Pt left v/m requesting rx hydrocodone apap. Call when ready for pick up. rx last printed # 90 on 04/07/16. Pt last seen 03/16/16. Pt is going to [place] on 05/05/16 and request to pick up on 05/04/16.

## 2016-05-01 NOTE — Telephone Encounter (Signed)
Spoken to patient and notified her that Rx is ready for pick up. Left in front office.

## 2016-05-22 ENCOUNTER — Ambulatory Visit (INDEPENDENT_AMBULATORY_CARE_PROVIDER_SITE_OTHER): Payer: PPO | Admitting: Primary Care

## 2016-05-22 ENCOUNTER — Encounter: Payer: Self-pay | Admitting: Primary Care

## 2016-05-22 ENCOUNTER — Encounter: Payer: Self-pay | Admitting: Radiology

## 2016-05-22 VITALS — BP 124/76 | HR 68 | Temp 99.0°F | Ht 69.0 in | Wt 215.4 lb

## 2016-05-22 DIAGNOSIS — G35 Multiple sclerosis: Secondary | ICD-10-CM

## 2016-05-22 DIAGNOSIS — M1990 Unspecified osteoarthritis, unspecified site: Secondary | ICD-10-CM | POA: Diagnosis not present

## 2016-05-22 DIAGNOSIS — Z79891 Long term (current) use of opiate analgesic: Secondary | ICD-10-CM | POA: Diagnosis not present

## 2016-05-22 DIAGNOSIS — M1631 Unilateral osteoarthritis resulting from hip dysplasia, right hip: Secondary | ICD-10-CM | POA: Diagnosis not present

## 2016-05-22 DIAGNOSIS — E538 Deficiency of other specified B group vitamins: Secondary | ICD-10-CM

## 2016-05-22 MED ORDER — CYANOCOBALAMIN 1000 MCG/ML IJ SOLN
1000.0000 ug | Freq: Once | INTRAMUSCULAR | Status: AC
  Administered 2016-05-22: 1000 ug via INTRAMUSCULAR

## 2016-05-22 MED ORDER — HYDROCODONE-ACETAMINOPHEN 10-325 MG PO TABS: 1.0000 | ORAL_TABLET | Freq: Four times a day (QID) | ORAL | Status: DC | PRN

## 2016-05-22 NOTE — Assessment & Plan Note (Signed)
Level of 152 in May 2017. Could not tolerate oral B12 tablets. B12 injection provided today in office with strict return precautions.

## 2016-05-22 NOTE — Assessment & Plan Note (Signed)
Refill of hydrocodone provided today.

## 2016-05-22 NOTE — Assessment & Plan Note (Addendum)
Symptoms progressing and is now falling more frequently and experiencing increased weakness. She is ready for referral to neurology for further evaluation. Referral placed to a specialist whom she found on the area within [place]. Exam overall unremarkable today.

## 2016-05-22 NOTE — Patient Instructions (Signed)
We have provided you with a Vitamin B12 injection today.   You will be contacted regarding your referral to Dr. George Hugh.  Please let us know if you have not heard back within one week.   It was a pleasure to see you today!

## 2016-05-22 NOTE — Progress Notes (Signed)
Subjective:    Patient ID: Emily Moon, female    DOB: May 08, 1961, 55 y.o.   MRN: [id number]  HPI  Emily Moon is a 55 year old female who presents today to discuss Multiple Sclerosis. Diagnosed with Multiple Sclerosis numerous years ago. She has followed with neurology in the past but didn't care for the medication so she discontinued her treatment. She has been advised over the years to seek specialist treatment as she's had progressive symptoms.  She's experienced a gradual decline over the past 1-2 years with speech, handwriting, arthralgias. Over the past 1-2 months she's noticed more frequent falls, increased weakness, changes to her bowels (constipation and then diarrhea), muscle spasms around her ribs, etc. She had a bad flare during a trip to [place] several weeks ago.   She has decided that she would like to see a specialist for her condition and would like to see Dr. George Hugh specifically.   She is also requesting vitamin B 12 injection as she could not tolreate the oral B 12 tablets. Her vitamin B 12 was quite low in May 2017 at 152. She is also requesting a refill of her pain medication for chronic hip pain.   Review of Systems  Respiratory: Negative for shortness of breath.   Cardiovascular: Negative for chest pain.  Gastrointestinal: Positive for diarrhea and constipation. Negative for nausea, vomiting and abdominal pain.  Musculoskeletal: Positive for arthralgias.  Neurological: Positive for weakness. Negative for dizziness and headaches.       Past Medical History  Diagnosis Date  . Bipolar disorder ([place]) 05/21/14  . Multiple sclerosis (HCC)     weakness  . Depression   . GERD (gastroesophageal reflux disease)   . Osteoporosis     osteoarthritis  . Fibromyalgia syndrome   . 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 Heart palpitations   . Edema     feet/legs  . Urinary frequency   . Cataracts, bilateral   . Psoriasis   . Fungal infection     Finger  nails  . Pneumonia   . Dizziness     Positional  . Hypercholesterolemia   . PONV (postoperative nausea and vomiting)     no problem after cataract surgery  . Shortness of breath dyspnea     wheezing  . Headache     seasonal allergies  . Arthritis     osteo  . Nephrolithiasis     kidney stones  . Renal stone   . DDD (degenerative disc disease), cervical     also back  . Sleep apnea 2012    sleep study / slight, no interventions     Social History   Social History  . Marital Status: Divorced    Spouse Name: N/A  . Number of Children: 1  . Years of Education: N/A   Occupational History  . Customer Service Rep at [employer]  . Smoking status: Former Smoker -- 0.50 packs/day for 25 years    Types: Cigarettes    Quit date: 11/16/2012  . Smokeless tobacco: Never Used     Comment: recenty 04/26/2014  . Alcohol Use: No  . Drug Use: No  . Sexual Activity: No   Other Topics Concern  . Not on file   Social History Narrative    Past Surgical History  Procedure Laterality Date  . Lithotripsy    . Cataract extraction w/phaco Left 05/21/2015    Procedure: CATARACT EXTRACTION PHACO AND  INTRAOCULAR LENS PLACEMENT (IOC);  Surgeon: Birder Robson, MD;  Location: [facility];  Service: Ophthalmology;  Laterality: Left;  [place] 00:35 AP% 22.9 CDE 8.11 fluid pack lot [id number]  . Foot surgery  2015  . Cataract extraction w/phaco Right 06/04/2015    Procedure: CATARACT EXTRACTION PHACO AND INTRAOCULAR LENS PLACEMENT (IOC);  Surgeon: Birder Robson, MD;  Location: [facility];  Service: Ophthalmology;  Laterality: Right;  US:00:48 AP%: 10.5 CDE:5.08 Fluid lot [id number]  . Tonsillectomy  1973  . Eye surgery  2015    tissue biopsy  . Joint replacement Left 2013    hip replacement  . Thumb surgery Right   . Ptosis repair Bilateral 02/18/2016    Procedure: BILATERAL PTOSIS REPAIR UPPER EYELIDS;  Surgeon: Karle Starch, MD;  Location: [place];   Service: Ophthalmology;  Laterality: Bilateral;  LEAVE PT EARLY AM    Family History  Problem Relation Age of Onset  . Cancer Father     Allergies  Allergen Reactions  . Albuterol Shortness Of Breath and Other (See Comments)    Makes pt feel jittery/ tacycardic  . Tylenol [Acetaminophen] Swelling  . Ultram [Tramadol Hcl] Itching and Nausea And Vomiting    dizzyness  . Aripiprazole Other (See Comments)    Pt not aware  . Cefaclor     Doesn't remember  . Diclofenac Sodium     Doesn't remember  . Halcion [Triazolam] Other (See Comments)    Dizziness,headaches,bladder problems  . Ibuprofen Swelling    Can take small amounts  . Levaquin [Levofloxacin In D5w] Diarrhea and Itching    Shoulder pain  . Naproxen Sodium Swelling  . Sulfa Antibiotics     Doesn't remember  . Voltaren [Diclofenac]     Current Outpatient Prescriptions on File Prior to Visit  Medication Sig Dispense Refill  . ASPERCREME LIDOCAINE EX Apply topically.    Marland Kitchen aspirin 325 MG tablet Take 325 mg by mouth as needed for headache.     Marland Kitchen BIOTIN PO Take 5,000 mg by mouth daily.     . calcipotriene-betamethasone (TACLONEX) ointment Apply topically daily. 60 g 0  . calcium carbonate (TUMS - DOSED IN MG ELEMENTAL CALCIUM) 500 MG chewable tablet Chew 2 tablets by mouth daily.     . cetirizine (ZYRTEC) 10 MG tablet Take 10 mg by mouth daily. pm    . Cholecalciferol (VITAMIN D-3 PO) Take 10,000 Units by mouth daily.     . ciclopirox (PENLAC) 8 % solution Apply nightly over nail and surrounding skin. Apply daily over prior coat. After 7 days, may remove with alcohol and continue 6.6 mL 3  . clobetasol cream (TEMOVATE) [id number] % Apply 1 application topically 2 (two) times daily. 30 g 2  . diclofenac sodium (VOLTAREN) 1 % GEL Apply 4 g topically 4 (four) times daily. (Patient taking differently: Apply 4 g topically as needed. ) 500 g 5  . DULoxetine (CYMBALTA) 20 MG capsule Take 40 mg by mouth daily. pm  4  . fluticasone  (FLONASE) 50 MCG/ACT nasal spray Place 1 spray into both nostrils daily.    . Garcinia Cambogia-Chromium 500-200 MG-MCG TABS Take 1 tablet by mouth 2 (two) times daily. Reported on 02/12/2016    . hydroquinone 4 % cream Reported on 02/18/2016  3  . hydrOXYzine (VISTARIL) 50 MG capsule Take 100-150 mg by mouth at bedtime.     . lamoTRIgine (LAMICTAL) 150 MG tablet Take two tablets by mouth daily (Patient taking differently: Take two tablets by  mouth daily/ pm) 180 tablet 3  . levalbuterol (XOPENEX HFA) 45 MCG/ACT inhaler Inhale 1-2 puffs into the lungs every 6 (six) hours as needed for wheezing. 1 Inhaler 5  . Lysine 1000 MG TABS Take 2,000 mg by mouth daily.     . NEOMYCIN-POLYMYXIN-HYDROCORTISONE (CORTISPORIN) 1 % SOLN otic solution Apply 1-2 drops to toe BID after soaking 10 mL 1  . nicotine (NICODERM CQ - DOSED IN MG/24 HOURS) 21 mg/24hr patch Place 1 patch (21 mg total) onto the skin daily. 28 patch 3  . Omega-3 Fatty Acids (FISH OIL PO) Take 4 capsules by mouth at bedtime.     Marland Kitchen omeprazole (PRILOSEC) 20 MG capsule Take 20 mg by mouth as needed.    . Phenazopyridine HCl (AZO TABS PO) Take 2 tablets by mouth as needed.     . polyethylene glycol powder (GLYCOLAX/MIRALAX) powder MIX 17 GRAMS (1 CAPFUL) WITH 4-8 OZ OF LIQUID AND TAKE BY MOUTH TWICE DAILY AS NEEDED 527 g 0  . Probiotic Product (PROBIOTIC PO) Take 1 tablet by mouth daily. Reported on 02/12/2016    . ranitidine (ZANTAC) 150 MG tablet Take 150 mg by mouth 2 (two) times daily as needed for heartburn. Reported on 02/12/2016    . tamsulosin (FLOMAX) 0.4 MG CAPS capsule TAKE 1 CAPSULE (0.4 MG TOTAL) BY MOUTH DAILY. 30 capsule 3  . temazepam (RESTORIL) 30 MG capsule TAKE ONE TO TWO CAPSULES BY MOUTH NIGHTLY AT BEDTIME AS NEEDED  60 capsule 0  . tiZANidine (ZANAFLEX) 4 MG tablet Take 2 tablets (8 mg total) by mouth 3 (three) times daily. (Patient taking differently: Take 8 mg by mouth 3 (three) times daily. pm) 180 tablet 5  . zolpidem (AMBIEN)  10 MG tablet Take 10 mg by mouth at bedtime as needed for sleep.     No current facility-administered medications on file prior to visit.    BP 124/76 mmHg  Pulse 68  Temp(Src) 99 F (37.2 C) (Oral)  Ht 5\' 9"  (1.753 m)  Wt 215 lb 6.4 oz (97.705 kg)  BMI 31.79 kg/m2  SpO2 98%  LMP 08/23/2014    Objective:   Physical Exam  Constitutional: She is oriented to person, place, and time.  Eyes: Pupils are equal, round, and reactive to light.  Neck: Neck supple.  Cardiovascular: Normal rate and regular rhythm.   Pulmonary/Chest: Effort normal and breath sounds normal.  Musculoskeletal:  Overall good range of motion to extremities, steady gait, good strength bilaterally.  Neurological: She is alert and oriented to person, place, and time.  Skin: Skin is warm and dry.  Psychiatric: She has a normal mood and affect.          Assessment & Plan:

## 2016-05-22 NOTE — Progress Notes (Signed)
Pre visit review using our clinic review tool, if applicable. No additional management support is needed unless otherwise documented below in the visit note. 

## 2016-05-28 ENCOUNTER — Other Ambulatory Visit: Payer: Self-pay | Admitting: Family Medicine

## 2016-05-29 NOTE — Telephone Encounter (Signed)
Electronically refill request for   tizanidine (ZANAFLEX) 4 MG tablet   Take 2 tablets (8 mg total) by mouth 3 (three) times daily.  Dispense: 180 tablet   Refills: 5      Last prescribed on 12/12/2015 by Dr Lorelei Pont.

## 2016-06-05 ENCOUNTER — Ambulatory Visit: Payer: PPO | Attending: Family Medicine | Admitting: Physical Therapy

## 2016-06-05 DIAGNOSIS — M25552 Pain in left hip: Secondary | ICD-10-CM | POA: Insufficient documentation

## 2016-06-05 DIAGNOSIS — M545 Low back pain, unspecified: Secondary | ICD-10-CM

## 2016-06-05 DIAGNOSIS — R279 Unspecified lack of coordination: Secondary | ICD-10-CM | POA: Diagnosis not present

## 2016-06-05 DIAGNOSIS — M6281 Muscle weakness (generalized): Secondary | ICD-10-CM | POA: Diagnosis not present

## 2016-06-05 NOTE — Patient Instructions (Addendum)
Relaxation and stretches Yoga video 20 min     Frog stretch  10 x 2 reps   Inhale, do nothing,       Exhale, let heels drop in 45 deg from midline       Child's pose rocking  10 x 2 reps  Table position, weight through the palms not only wrists.   Keep belly engaged to not let back sway      __________  Strengthening:   1. Sit to stand 10x reps x 2-4 sets per day  Inhale, smell the roses in a mini squat, knees behind toes, feet hip width apart     2. Clam shells 10 reps x 2 sets per day Inhale  Exhale lift knee        You are now ready to begin training the deep core muscles system: diaphragm, transverse abdominis, pelvic floor . These muscles must work together as a team.      The key to these exercises to train the brain to coordinate the timing of these muscles and to have them turn on for long periods of time to hold you upright against gravity (especially important if you are on your feet all day).These muscles are postural muscles and play a role stabilizing your spine and bodyweight. By doing these repetitions slowly and correctly instead of doing crunches, you will achieve a flatter belly without a lower pooch. You are also placing your spine in a more neutral position and breathing properly which in turn, decreases your risk for problems related to your pelvic floor, abdominal, and low back such as pelvic organ prolapse, hernias, diastasis recti (separation of superficial muscles), disk herniations, spinal fractures. These exercises set a solid foundation for you to later progress to resistance/ strength training with therabands and weights and return to other typical fitness exercises with a stronger deeper core.  Level 1  (10 reps, slow )  Level 2  (30 reps  In morning and 30 reps at night)

## 2016-06-05 NOTE — Therapy (Addendum)
[place] MAIN [facility] SERVICES [address] [place], [place], [address] Phone: [phone]   Fax:  [phone]  Physical Therapy Treatment / Progress Note    Patient Details  Name: Emily Moon MRN: [id number] Date of Birth: 18-Jul-1961 Referring Provider: Edilia Bo  Encounter Date: 06/05/2016    Past Medical History:  Diagnosis Date  . Arthritis    osteo  . Bipolar disorder ([place]) 05/21/14  . Cataracts, bilateral   . DDD (degenerative disc disease), cervical    also back  . Depression   . Dizziness    Positional  . Edema    feet/legs  . Fibromyalgia syndrome   . Fungal infection    Finger nails  . GERD (gastroesophageal reflux disease)   . Headache    seasonal allergies  . Heart palpitations   . 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 Hypercholesterolemia   . Multiple sclerosis (HCC)    weakness  . Nephrolithiasis    kidney stones  . Osteoporosis    osteoarthritis  . Pneumonia   . PONV (postoperative nausea and vomiting)    no problem after cataract surgery  . Psoriasis   . Renal stone   . Shortness of breath dyspnea    wheezing  . Sleep apnea 2012   sleep study / slight, no interventions  . Urinary frequency     Past Surgical History:  Procedure Laterality Date  . CATARACT EXTRACTION W/PHACO Left 05/21/2015   Procedure: CATARACT EXTRACTION PHACO AND INTRAOCULAR LENS PLACEMENT (IOC);  Surgeon: Birder Robson, MD;  Location: [facility];  Service: Ophthalmology;  Laterality: Left;  [place] 00:35 AP% 22.9 CDE 8.11 fluid pack lot #[id number] H  . CATARACT EXTRACTION W/PHACO Right 06/04/2015   Procedure: CATARACT EXTRACTION PHACO AND INTRAOCULAR LENS PLACEMENT (IOC);  Surgeon: Birder Robson, MD;  Location: [facility];  Service: Ophthalmology;  Laterality: Right;  US:00:48 AP%: 10.5 CDE:5.08 Fluid lot #[id number] H  . EYE SURGERY  2015   tissue biopsy  . FOOT SURGERY  2015  . JOINT REPLACEMENT Left 2013   hip replacement  .  LITHOTRIPSY    . PTOSIS REPAIR Bilateral 02/18/2016   Procedure: BILATERAL PTOSIS REPAIR UPPER EYELIDS;  Surgeon: Karle Starch, MD;  Location: [place];  Service: Ophthalmology;  Laterality: Bilateral;  LEAVE PT EARLY AM  . thumb surgery Right   . TONSILLECTOMY  1973    There were no vitals filed for this visit.      Subjective Assessment - 06/08/16 0834    Subjective Pt reported she fell 04/16/16 and hurt her L ankle/knee and hands/shoulders. Pt also has been under another relapse of MS due to stress, heat from a trip . Pt reports her R hip is starting to hurt into the groin in addition to her L hip which hurts on the outside. Pt would like to learn some arm strengthening exercise "beacuse I am so weak". Pain level at hip 2/10 B, R shoulder 7/10 , lower L back 5/10.     Pertinent History bi-polar disorder, history of L THA  2013, chronic pain/fibromyalgia, MS, lower leg nuumbness   How long can you sit comfortably? 30 with "right chair"   Patient Stated Goals reduce hip pain, balance             [facility] PT Assessment - 06/08/16 0001      Assessment   Medical Diagnosis L hip pain   Referring Provider [place]   Onset Date/Surgical Date 03/18/12  Precautions   Precautions Fall  MS     Restrictions   Weight Bearing Restrictions No     Balance Screen   Has the patient fallen in the past 6 months Yes     [place] residence   Living Arrangements Other relatives  sister   Type of [place] to enter   Entrance Stairs-Number of Steps 1   [place] - single point     Prior Function   Level of Independence Independent;Independent with community mobility without device  pt having increased pain/impaired balance.     Palpation   Palpation comment R and L  obt int mm tensions and tenderness (pre Tx) , decreased Post Tx.                      [facility] Adult PT Treatment/Exercise - 06/08/16  0820      Therapeutic Activites    Therapeutic Activities --  guided seated chair yoga      Neuro Re-ed    Neuro Re-ed Details  deep core coordination, level 1-2 , 10 reps, explained the importance of not hovering over toilet and to sit and have feet flat on ground when voiding to minimize pelvic floor mm tensions     Manual Therapy   Manual therapy comments sustained pressure at B obt int  with coordination training for breathing and pelvic floor lengthening                PT Education - 06/08/16 2707    Education provided Yes   Education Details HEP   Person(s) Educated Patient   Methods Explanation;Demonstration;Tactile cues;Handout;Verbal cues   Comprehension Verbalized understanding;Returned demonstration             PT Long Term Goals - 06/08/16 8675      PT LONG TERM GOAL #1   Title  Pt will be IND w/ strengthening exercises in arms and legs   Time 8   Period Weeks   Status Partially Met     PT LONG TERM GOAL #2   Title pt will be able to sit for 1 hr without increased hip pain    Time 8   Period Weeks   Status Not Met     PT LONG TERM GOAL #3   Title pt will ambulate 1.49ms for normalized community mobility   Time 8   Period Weeks   Status Partially Met     PT LONG TERM GOAL #4   Title Pt will demonstrate decreased pelvic floor mm tensions and tenderness with palpation at Obturator Internus Bilaterally in order to minimize hip/ groin pain.   Time 12   Period Weeks   Status New     PT LONG TERM GOAL #5   Title Pt will demo proper body mechanics in floor <> rise and proper alignment in order to show increased strength and IND w /  safe transfers   Time 12   Period Weeks   Status New     Additional Long Term Goals   Additional Long Term Goals Yes     PT LONG TERM GOAL #6   Title Pt will report not hovering over the toileting seat when voiding in public restrooms and instead sit completely on the seat in order to minimize overactivity of  pelvic floor mm and improve urinary incontinence.    Time 12   Period Weeks  Status New               Plan - 06/08/16 0962    Clinical Impression Statement Pt has had a relapse of MS 2/2 stress and she also has had sustained another fall. Pt reported 70% less pain at hip and groin following manual Tx in releasing obturator internus mm tensions with external techniques. Pt has met 2/6 goals and will benefit from continued PT in order to achieve remaining goals related to balance, overall strengthening, and pelvic floor issues. Pt will also benefit from biopsychosocial approaches for improved stress management.   Patient will benefit from skilled therapeutic intervention in order to improve the following deficits and impairments:  Decreased balance, Difficulty walking, Decreased safety awareness, Decreased activity tolerance, Decreased coordination, Abnormal gait, Hypomobility, Increased muscle spasms, Improper body mechanics, Pain, Decreased range of motion, Decreased endurance, Impaired flexibility    Rehab Potential Fair   Clinical Impairments Affecting Rehab Potential fibromyalgia/ chronic pain, L THA, bipolar disorder, MS, little home support, limited financial resources   PT Frequency 1x / week   PT Duration 12 weeks   PT Treatment/Interventions Aquatic Therapy;Patient/family education;Neuromuscular re-education;Balance training;Therapeutic exercise;Therapeutic activities;Functional mobility training;Stair training;Gait training;Manual techniques;Energy conservation      Patient will benefit from skilled therapeutic intervention in order to improve the following deficits and impairments:  Decreased balance, Difficulty walking, Decreased safety awareness, Decreased activity tolerance, Decreased coordination, Abnormal gait, Hypomobility, Increased muscle spasms, Improper body mechanics, Pain, Decreased range of motion, Decreased endurance, Impaired flexibility  Visit  Diagnosis: Midline low back pain without sciatica - Plan: PT plan of care cert/re-cert  Pain in left hip - Plan: PT plan of care cert/re-cert  Muscle weakness (generalized) - Plan: PT plan of care cert/re-cert  Unspecified lack of coordination - Plan: PT plan of care cert/re-cert       G-Codes - 2016/06/29 8366    Functional Assessment Tool Used clinical judgement   Functional Limitation Mobility: Walking and moving around   Mobility: Walking and Moving Around Current Status [phone]) At least 20 percent but less than 40 percent impaired, limited or restricted   Mobility: Walking and Moving Around Goal Status [phone]) At least 20 percent but less than 40 percent impaired, limited or restricted      Problem List Patient Active Problem List   Diagnosis Date Noted  . B12 deficiency 05/22/2016  . Dysuria 04/23/2016  . Dizziness 03/16/2016  . Back pain 10/31/2014  . Bipolar disorder ([place]) 05/21/2014  . 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 Insomnia 09/01/2011  . Obesity 05/04/2011  . Perimenopausal vasomotor symptoms 02/05/2011  . HLD (hyperlipidemia) 01/20/2011  . SMOKER 01/20/2011  . Chronic pain syndrome 01/07/2011  . RENAL CALCULUS, RECURRENT 11/13/2010  . MULTIPLE SCLEROSIS, PROGRESSIVE/RELAPSING 08/28/2010  . GERD 08/28/2010  . Osteoarthritis 08/28/2010  . NEPHROLITHIASIS, HX OF 08/28/2010    Jerl Mina ,PT, DPT, E-RYT  06/08/2016, 9:42 AM  [place] MAIN [facility] SERVICES [address] [place], [place], [address] Phone: [phone]   Fax:  [phone]  Name: Emily Moon MRN: [id number] Date of Birth: Sep 28, 1961

## 2016-06-08 DIAGNOSIS — M545 Low back pain: Secondary | ICD-10-CM | POA: Diagnosis not present

## 2016-06-08 NOTE — Addendum Note (Signed)
Addended by: Jerl Mina on: 06/08/2016 08:45 AM   Modules accepted: Orders

## 2016-06-09 ENCOUNTER — Encounter: Payer: Self-pay | Admitting: Primary Care

## 2016-06-17 ENCOUNTER — Other Ambulatory Visit: Payer: Self-pay | Admitting: Primary Care

## 2016-06-17 ENCOUNTER — Other Ambulatory Visit: Payer: Self-pay

## 2016-06-17 ENCOUNTER — Telehealth: Payer: Self-pay | Admitting: Primary Care

## 2016-06-17 DIAGNOSIS — M1990 Unspecified osteoarthritis, unspecified site: Secondary | ICD-10-CM

## 2016-06-17 DIAGNOSIS — E538 Deficiency of other specified B group vitamins: Secondary | ICD-10-CM

## 2016-06-17 MED ORDER — HYDROCODONE-ACETAMINOPHEN 10-325 MG PO TABS
1.0000 | ORAL_TABLET | Freq: Four times a day (QID) | ORAL | 0 refills | Status: DC | PRN
Start: 2016-06-17 — End: 2016-07-06

## 2016-06-17 MED ORDER — HYDROCODONE-ACETAMINOPHEN 10-325 MG PO TABS: 1.0000 | ORAL_TABLET | Freq: Four times a day (QID) | ORAL | 0 refills | Status: DC | PRN

## 2016-06-17 NOTE — Telephone Encounter (Signed)
She must get a vitamin B12 lab first before another injection. I believe she already has an appointment scheduled for labs.

## 2016-06-17 NOTE — Telephone Encounter (Signed)
Patient called back and request to cancel the lab appointment. Patient request to speak to [facility]. Notified patient that Anda Kraft will speak to after she is done seeing patients today.  Also patient would like Anda Kraft to know that she has appointment with neurology but it is in October.

## 2016-06-17 NOTE — Telephone Encounter (Signed)
Pt called wanting to know if she could get a b12 injection. The b12 shot you gave her seemed to help  Is it ok to schedule?

## 2016-06-17 NOTE — Telephone Encounter (Signed)
Patient stated that she is going out of town and will have to cancel the lab appointment next week.   Patient would like to have the injection this week. Patient did ask to have lab and injection on the same if she has to. But patient stated that the injection really helps her fatigue. Please advise.

## 2016-06-17 NOTE — Telephone Encounter (Signed)
Printed 3 month supply of Norco that is ready for pickup at her convenience. Please ensure she is aware of these prescriptions must last for 3 months. Patent into the fill date. Placed in [facility] in box.  Patient may also obtain vitamin B12 injections once monthly through October 2017. Please set this up at her convenience.

## 2016-06-17 NOTE — Telephone Encounter (Signed)
Spoke with patient. Much improved on B12 and will allow an additional injection prior to labs as she's only had 1 injection. She will come tomorrow for B12 injection with labs in September.

## 2016-06-17 NOTE — Telephone Encounter (Signed)
She must have labs before the injection. If she obtains labs today then it should return by tomorrow.

## 2016-06-17 NOTE — Telephone Encounter (Signed)
Lab appt has been schedule for today 06/17/2016

## 2016-06-17 NOTE — Telephone Encounter (Signed)
Pt left v/m requesting rx hydrocodone apap. Call when ready for pick up. Pt last seen and last printed #90 on 05/22/16. Pt wants to pick up on 06/18/16. Pt is going out of town on 06/19/16.

## 2016-06-18 ENCOUNTER — Ambulatory Visit: Payer: Self-pay

## 2016-06-18 ENCOUNTER — Ambulatory Visit (INDEPENDENT_AMBULATORY_CARE_PROVIDER_SITE_OTHER): Payer: PPO

## 2016-06-18 DIAGNOSIS — E538 Deficiency of other specified B group vitamins: Secondary | ICD-10-CM | POA: Diagnosis not present

## 2016-06-18 MED ORDER — CYANOCOBALAMIN 1000 MCG/ML IJ SOLN
1000.0000 ug | Freq: Once | INTRAMUSCULAR | Status: AC
  Administered 2016-06-18: 1000 ug via INTRAMUSCULAR

## 2016-06-18 NOTE — Telephone Encounter (Signed)
Spoke with patient regarding questions and concerns.

## 2016-06-18 NOTE — Telephone Encounter (Signed)
Patient came in to pick Rx and have questions. Patient request when Anda Kraft have time. Please give call patient.

## 2016-06-18 NOTE — Telephone Encounter (Signed)
Patient already notified and pick up Rx.

## 2016-06-23 ENCOUNTER — Other Ambulatory Visit: Payer: Self-pay

## 2016-06-29 ENCOUNTER — Ambulatory Visit: Payer: Self-pay | Admitting: Family Medicine

## 2016-07-02 ENCOUNTER — Other Ambulatory Visit: Payer: Self-pay | Admitting: Primary Care

## 2016-07-02 DIAGNOSIS — G35 Multiple sclerosis: Secondary | ICD-10-CM

## 2016-07-06 ENCOUNTER — Encounter: Payer: Self-pay | Admitting: Family Medicine

## 2016-07-06 ENCOUNTER — Ambulatory Visit (INDEPENDENT_AMBULATORY_CARE_PROVIDER_SITE_OTHER): Payer: PPO | Admitting: Family Medicine

## 2016-07-06 VITALS — BP 112/80 | HR 92 | Temp 98.7°F | Ht 69.0 in | Wt 215.5 lb

## 2016-07-06 DIAGNOSIS — M1611 Unilateral primary osteoarthritis, right hip: Secondary | ICD-10-CM

## 2016-07-06 DIAGNOSIS — M25511 Pain in right shoulder: Secondary | ICD-10-CM | POA: Diagnosis not present

## 2016-07-06 MED ORDER — METHYLPREDNISOLONE ACETATE 40 MG/ML IJ SUSP
80.0000 mg | Freq: Once | INTRAMUSCULAR | Status: AC
  Administered 2016-07-06: 80 mg via INTRA_ARTICULAR

## 2016-07-06 NOTE — Progress Notes (Signed)
Dr. Frederico Hamman T. Kuzey Ogata, MD, [facility] [place], [address] Phone: [phone] Fax: [phone]  07/06/2016  Patient: Emily Moon, MRN: [id number], DOB: November 26, 1960, 55 y.o.  Primary Physician:  Sheral Flow, NP   Chief Complaint  Patient presents with  . Hip Pain    Bilateral-Right Hip worse   Subjective:   Emily Moon is a 55 y.o. very pleasant female patient who presents with the following:  Fell at her first day in [place]. Swam with the dophins. Carrying up stuff and went up a and stepped off the path. Fallen about 4 times since our last.  Pain both shoulders and both hips.  Generally does not feel well and has gained weight.   Awaiting an neuro input from a MD in [place]. She will not go back on MS drugs per report.   R intraartic shoulder injec  Past Medical History, Surgical History, Social History, Family History, Problem List, Medications, and Allergies have been reviewed and updated if relevant.  Patient Active Problem List   Diagnosis Date Noted  . Bipolar disorder ([place]) 05/21/2014    Priority: High  . MULTIPLE SCLEROSIS, PROGRESSIVE/RELAPSING 08/28/2010    Priority: High  . Chronic pain syndrome 01/07/2011    Priority: Medium  . B12 deficiency 05/22/2016  . Dysuria 04/23/2016  . Dizziness 03/16/2016  . Back pain 10/31/2014  . 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 Insomnia 09/01/2011  . Obesity 05/04/2011  . Perimenopausal vasomotor symptoms 02/05/2011  . HLD (hyperlipidemia) 01/20/2011  . SMOKER 01/20/2011  . RENAL CALCULUS, RECURRENT 11/13/2010  . GERD 08/28/2010  . Osteoarthritis 08/28/2010  . NEPHROLITHIASIS, HX OF 08/28/2010    Past Medical History:  Diagnosis Date  . Arthritis    osteo  . Bipolar disorder ([place]) 05/21/14  . Cataracts, bilateral   . DDD  (degenerative disc disease), cervical    also back  . Depression   . Dizziness    Positional  . Edema    feet/legs  . Fibromyalgia syndrome   . Fungal infection    Finger nails  . GERD (gastroesophageal reflux disease)   . Headache    seasonal allergies  . Heart palpitations   . 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 Hypercholesterolemia   . Multiple sclerosis (H[facility])    weakness  . Nephrolithiasis    kidney stones  . Osteoporosis    osteoarthritis  . Pneumonia   . PONV (postoperative nausea and vomiting)    no problem after cataract surgery  . Psoriasis   . Renal stone   . Shortness of breath dyspnea    wheezing  . Sleep apnea 2012   sleep study / slight, no interventions  . Urinary frequency     Past Surgical History:  Procedure Laterality Date  . CATARACT EXTRACTION W/PHACO Left 05/21/2015   Procedure: CATARACT EXTRACTION PHACO AND INTRAOCULAR LENS PLACEMENT (IOC);  Surgeon: Birder Robson, MD;  Location: [facility];  Service: Ophthalmology;  Laterality: Left;  [place] 00:35 AP% 22.9 CDE 8.11 fluid pack lot [id number]  . CATARACT EXTRACTION W/PHACO Right 06/04/2015   Procedure: CATARACT EXTRACTION PHACO AND INTRAOCULAR LENS PLACEMENT (IOC);  Surgeon: Birder Robson, MD;  Location: [facility];  Service: Ophthalmology;  Laterality: Right;  US:00:48 AP%: 10.5 CDE:5.08 Fluid lot [id number]  . EYE SURGERY  2015   tissue biopsy  . FOOT SURGERY  2015  . JOINT REPLACEMENT Left 2013   hip replacement  . LITHOTRIPSY    . PTOSIS REPAIR Bilateral 02/18/2016   Procedure: BILATERAL PTOSIS REPAIR UPPER EYELIDS;  Surgeon: Karle Starch, MD;  Location: [place];  Service: Ophthalmology;  Laterality: Bilateral;  LEAVE PT EARLY AM  . thumb surgery Right   . TONSILLECTOMY  1973    Social History   Social History  . Marital status: Divorced    Spouse name: N/A  . Number of children: 1  . Years of education: N/A   Occupational History  . 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 Smoking status: Former Smoker    Packs/day: 0.50    Years: 25.00    Types: Cigarettes    Quit date: 11/16/2012  . Smokeless tobacco: Never Used     Comment: recenty 04/26/2014  . Alcohol use No  . Drug use: No  . Sexual activity: No   Other Topics Concern  . Not on file   Social History Narrative  . No narrative on file    Family History  Problem Relation Age of Onset  . Cancer Father     Allergies  Allergen Reactions  . Albuterol Shortness Of Breath and Other (See Comments)    Makes pt feel jittery/ tacycardic  . Tylenol [Acetaminophen] Swelling  . Ultram [Tramadol Hcl] Itching and Nausea And Vomiting    dizzyness  . Aripiprazole Other (See Comments)    Pt not aware  . Cefaclor     Doesn't remember  . Diclofenac Sodium     Doesn't remember  . Halcion [Triazolam] Other (See Comments)    Dizziness,headaches,bladder problems  . Ibuprofen Swelling    Can take small amounts  . Levaquin [Levofloxacin In D5w] Diarrhea and Itching    Shoulder pain  . Naproxen Sodium Swelling  . Sulfa Antibiotics     Doesn't remember  . Voltaren [Diclofenac]     Medication list reviewed and updated in full in [place].  GEN: No fevers, chills. Nontoxic. Primarily MSK c/o today. MSK: Detailed in the HPI GI: tolerating PO intake without difficulty Neuro: No numbness, parasthesias, or tingling associated. Otherwise the pertinent positives of the ROS are noted above.   Objective:   BP 112/80   Pulse 92   Temp 98.7 F (37.1 C) (Oral)   Ht 5\' 9"  (1.753 m)   Wt 215 lb 8 oz (97.8 kg)   LMP 08/23/2014   BMI 31.82 kg/m    GEN: Well-developed,well-nourished,in no acute distress; alert,appropriate and cooperative throughout examination HEENT: Normocephalic and atraumatic without obvious abnormalities. Ears, externally no deformities PULM: Breathing comfortably in no respiratory distress EXT: No clubbing, cyanosis, or edema PSYCH:  Normally interactive. Cooperative during the interview. Pleasant. Friendly and conversant. Not anxious or depressed appearing. Normal, full affect.  Shoulder: R Inspection: No muscle wasting or winging Ecchymosis/edema: neg  AC joint, scapula, clavicle: NT Cervical spine: NT, full ROM Spurling's: neg Abduction: full, 5/5 Flexion: full, 5/5 IR, full, lift-off: 5/5 ER at neutral: full, 5/5 AC crossover: neg Neer: pos Hawkins: pos Drop Test: neg Empty Can: pos Supraspinatus insertion: mild-mod T Bicipital groove: NT Speed's: neg Yergason's: neg Sulcus sign: neg Scapular dyskinesis: none C5-T1 intact  Neuro: Sensation intact Grip 5/5   HIP EXAM: SIDE: R ROM: Abduction, Flexion, Internal and External range of motion: loss 15 deg in all directions Pain with terminal IROM and EROM: yes GTB: NT SLR:  NEG Knees: No effusion FABER: NT REVERSE FABER: NT, neg Piriformis: NT at direct palpation Str: flexion: 5/5 abduction: 5/5 adduction: 5/5 Strength testing non-tender     Radiology: No results found.  Assessment and Plan:   Primary osteoarthritis of right hip - Plan: DG FLUORO GUIDED NEEDLE PLC ASPIRATION/INJECTION LOC  Right shoulder pain - Plan: methylPREDNISolone acetate (DEPO-MEDROL) injection 80 mg  MS not well controlled. She is averse to medications.  R hip OA with dysplasia - fluoro guided hip injection.  R shoulder pain, will try a intraarticular injection for pain management.  Intrarticular Shoulder Injection, R Verbal consent was obtained from the patient. Risks including infection explained and contrasted with benefits and alternatives. Patient prepped with Chloraprep and Ethyl Chloride used for anesthesia. An intraarticular shoulder injection was performed using the posterior approach. The patient tolerated the procedure well and had decreased pain post injection. No complications. Injection: 8 cc of Lidocaine 1% and 2 mL Depo-Medrol 40 mg. Needle: 21  gauge 2 inch  Follow-up: No Follow-up on file.  Orders Placed This Encounter  Procedures  . DG FLUORO GUIDED NEEDLE PLC ASPIRATION/INJECTION LOC    Signed,  Ariannie Penaloza T. Barth Trella, MD   Patient's Medications  New Prescriptions   No medications on file  Previous Medications   ASPERCREME LIDOCAINE EX    Apply topically.   ASPIRIN 325 MG TABLET    Take 325 mg by mouth as needed for headache.    BIOTIN PO    Take 5,000 mg by mouth daily.    CALCIPOTRIENE-BETAMETHASONE (TACLONEX) OINTMENT    Apply topically daily.   CALCIUM CARBONATE (TUMS - DOSED IN MG ELEMENTAL CALCIUM) 500 MG CHEWABLE TABLET    Chew 2 tablets by mouth daily.    CETIRIZINE (ZYRTEC) 10 MG TABLET    Take 10 mg by mouth daily. pm   CHOLECALCIFEROL (VITAMIN D-3 PO)    Take 10,000 Units by mouth daily.    CICLOPIROX (PENLAC) 8 % SOLUTION    Apply nightly over nail and surrounding skin. Apply daily over prior coat. After 7 days, may remove with alcohol and continue   CLOBETASOL CREAM (TEMOVATE) 0.05 %    Apply 1 application topically 2 (two) times daily.   CYANOCOBALAMIN (,VITAMIN B-12,) 1000 MCG/ML INJECTION    Inject 1,000 mcg into the muscle once.   DICLOFENAC SODIUM (VOLTAREN) 1 % GEL    Apply 4 g topically 4 (four) times daily.   DULOXETINE (CYMBALTA) 20 MG CAPSULE    Take 40 mg by mouth daily. pm   FLUTICASONE (FLONASE) 50 MCG/ACT NASAL SPRAY    Place 1 spray into both nostrils daily.   HYDROCODONE-ACETAMINOPHEN (NORCO) 10-325 MG TABLET    Take 1 tablet by mouth every 6 (six) hours as needed for severe pain.   HYDROQUINONE 4 % CREAM    Reported on 02/18/2016   HYDROXYZINE (VISTARIL) 50 MG CAPSULE    Take 100-150 mg by mouth at bedtime.    LAMOTRIGINE (LAMICTAL) 150 MG TABLET    Take two tablets by mouth daily   LEVALBUTEROL (XOPENEX HFA) 45 MCG/ACT INHALER    Inhale 1-2 puffs into the lungs every 6 (six) hours as needed for wheezing.   LYSINE 1000 MG TABS    Take 2,000 mg by mouth daily.    NICOTINE (NICODERM CQ - DOSED  IN MG/24 HOURS) 21 MG/24HR PATCH    Place 1 patch (21 mg total) onto the skin daily.   OMEGA-3 FATTY ACIDS (FISH OIL PO)  Take 4 capsules by mouth at bedtime.    OMEPRAZOLE (PRILOSEC) 20 MG CAPSULE    Take 20 mg by mouth as needed.   PHENAZOPYRIDINE HCL (AZO TABS PO)    Take 2 tablets by mouth as needed.    POLYETHYLENE GLYCOL POWDER (GLYCOLAX/MIRALAX) POWDER    MIX 17 GRAMS (1 CAPFUL) WITH 4-8 OZ OF LIQUID AND TAKE BY MOUTH TWICE DAILY AS NEEDED   RANITIDINE (ZANTAC) 150 MG TABLET    Take 150 mg by mouth 2 (two) times daily as needed for heartburn. Reported on 02/12/2016   TEMAZEPAM (RESTORIL) 30 MG CAPSULE    TAKE ONE TO TWO CAPSULES BY MOUTH NIGHTLY AT BEDTIME AS NEEDED    TIZANIDINE (ZANAFLEX) 4 MG TABLET    TAKE 2 TABLETS (8 MG TOTAL) BY MOUTH 3 (THREE) TIMES DAILY.   ZOLPIDEM (AMBIEN) 10 MG TABLET    Take 10 mg by mouth at bedtime as needed for sleep.  Modified Medications   No medications on file  Discontinued Medications   GARCINIA CAMBOGIA-CHROMIUM 500-200 MG-MCG TABS    Take 1 tablet by mouth 2 (two) times daily. Reported on 02/12/2016   HYDROCODONE-ACETAMINOPHEN (NORCO) 10-325 MG TABLET    Take 1 tablet by mouth every 6 (six) hours as needed.   HYDROCODONE-ACETAMINOPHEN (NORCO) 10-325 MG TABLET    Take 1 tablet by mouth every 6 (six) hours as needed.   NEOMYCIN-POLYMYXIN-HYDROCORTISONE (CORTISPORIN) 1 % SOLN OTIC SOLUTION    Apply 1-2 drops to toe BID after soaking   PROBIOTIC PRODUCT (PROBIOTIC PO)    Take 1 tablet by mouth daily. Reported on 02/12/2016   TAMSULOSIN (FLOMAX) 0.4 MG CAPS CAPSULE    TAKE 1 CAPSULE (0.4 MG TOTAL) BY MOUTH DAILY.

## 2016-07-06 NOTE — Progress Notes (Signed)
Pre visit review using our clinic review tool, if applicable. No additional management support is needed unless otherwise documented below in the visit note. 

## 2016-07-15 ENCOUNTER — Ambulatory Visit: Payer: PPO | Admitting: Physical Therapy

## 2016-07-17 ENCOUNTER — Ambulatory Visit
Admission: RE | Admit: 2016-07-17 | Discharge: 2016-07-17 | Disposition: A | Discharge status: Home / Self Care | Payer: PPO | Source: Ambulatory Visit | Attending: Family Medicine | Admitting: Family Medicine

## 2016-07-17 DIAGNOSIS — M1611 Unilateral primary osteoarthritis, right hip: Secondary | ICD-10-CM | POA: Diagnosis not present

## 2016-07-17 IMAGING — XA DG FLUORO GUIDE NDL PLC/BX
1 series · 1 of 1 positions shown · non-contrast
Comparison: none

CLINICAL DATA: Right hip pain.  Osteoarthritis.

[Series 1: ortho standard · 1 of 1 slices shown]
[im 1/1]
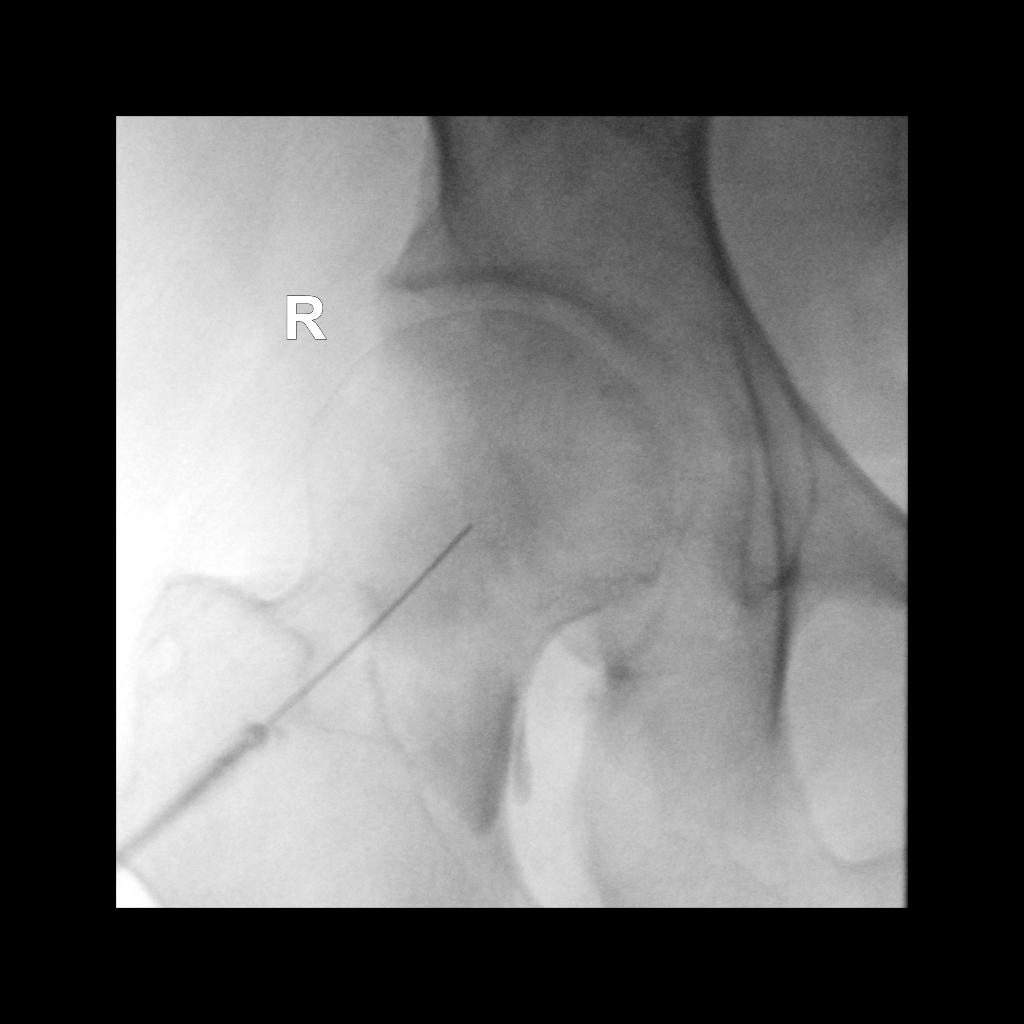

[1 of 1 positions shown; findings below may reference images not displayed]

FLUOROSCOPY TIME:  Radiation Exposure Index (as provided by the
fluoroscopic device): 55.85 uGy*m2

Fluoroscopy Time:  21 seconds

Number of Acquired Images:  0

PROCEDURE:
Following informed, written consent, the patient was positioned
supine and the right hip was located under fluoroscopy. The skin was
prepped and draped in the usual sterile fashion. The superficial
soft tissues were anesthetized with 1% lidocaine. A 22 gauge needle
was advanced into the joint. Placement was confirmed with 1.0 ml of
Isovue-M 200.

I then injected 120 mg Depo-Medrol and 1.5 mL 0.25% bupivacaine. The
patient tolerated the procedure without immediate complication.
IMPRESSION: Technically successful right hip steroid injection.

## 2016-07-17 MED ORDER — METHYLPREDNISOLONE ACETATE 40 MG/ML INJ SUSP (RADIOLOG
120.0000 mg | Freq: Once | INTRAMUSCULAR | Status: AC
  Administered 2016-07-17: 120 mg via INTRA_ARTICULAR

## 2016-07-17 MED ORDER — IOPAMIDOL (ISOVUE-M 200) INJECTION 41%
1.0000 mL | Freq: Once | INTRAMUSCULAR | Status: AC
  Administered 2016-07-17: 1 mL via INTRA_ARTICULAR

## 2016-08-13 ENCOUNTER — Encounter: Payer: Self-pay | Admitting: Primary Care

## 2016-08-13 ENCOUNTER — Ambulatory Visit (INDEPENDENT_AMBULATORY_CARE_PROVIDER_SITE_OTHER): Payer: PPO | Admitting: Primary Care

## 2016-08-13 VITALS — BP 122/82 | HR 93 | Temp 98.7°F | Wt 212.0 lb

## 2016-08-13 DIAGNOSIS — G35 Multiple sclerosis: Secondary | ICD-10-CM | POA: Diagnosis not present

## 2016-08-13 DIAGNOSIS — E538 Deficiency of other specified B group vitamins: Secondary | ICD-10-CM

## 2016-08-13 DIAGNOSIS — M1990 Unspecified osteoarthritis, unspecified site: Secondary | ICD-10-CM

## 2016-08-13 DIAGNOSIS — F3170 Bipolar disorder, currently in remission, most recent episode unspecified: Secondary | ICD-10-CM

## 2016-08-13 DIAGNOSIS — N760 Acute vaginitis: Secondary | ICD-10-CM | POA: Diagnosis not present

## 2016-08-13 LAB — VITAMIN B12: Vitamin B-12: 209 pg/mL — ABNORMAL LOW (ref 211–911)

## 2016-08-13 MED ORDER — HYDROCODONE-ACETAMINOPHEN 10-325 MG PO TABS: 1.0000 | ORAL_TABLET | Freq: Four times a day (QID) | ORAL | 0 refills | Status: DC | PRN

## 2016-08-13 MED ORDER — DOXYCYCLINE HYCLATE 100 MG PO TABS: 100.0000 mg | ORAL_TABLET | Freq: Two times a day (BID) | ORAL | 0 refills | Status: DC

## 2016-08-13 NOTE — Assessment & Plan Note (Signed)
Continues to follow with psychiatry. Stable on Cymbalta, Vistaril, Lamictal, temazepam, zolpidem.

## 2016-08-13 NOTE — Progress Notes (Signed)
Subjective:    Patient ID: Emily Moon, female    DOB: 15-Nov-1961, 55 y.o.   MRN: [id number]  HPI  Emily Moon is a 55 year old female who presents today with a chief complaint of cyst.  1) Vaginal Spot: Vaginal spot to right and left labia that she noticed 3-4 days ago. She has recently been traveling on long car rides to visit her son who lives in another state. She traveled 6 hours down to see her son and then was in the car for nearly 2 hours daily while visiting, and additional 6 hours to get home. She wears a thin poise pad daily due to incontinence. She attempted to pop these spots herself but nothing much came out. She did not applied anything topically over-the-counter.  2) Osteoarthritis: Currently managed on Norco 10-325 mg for which she takes every 6 hours on a scheduled basisFor chronic hip, neck, shoulder pain. She's been seeing Dr. Lorelei Pont with Sports Medicine for injections and treatment for chronic osteoarthritis. She is been on narcotics for years and has always been compliant.  3) Multiple sclerosis: Last visit she reported increased symptoms of weakness, "MS hug", falls for the last several months. This past week she attempted to get out of her car and her "legs wouldn't work". She has an appointment with a neurologist through [facility] next week. She has refused treatment in prior years.  Review of Systems  Constitutional: Negative for fever.  Gastrointestinal: Negative for abdominal pain.  Genitourinary: Negative for frequency and vaginal discharge.       Vaginal bump  Musculoskeletal: Positive for arthralgias.  Neurological: Positive for weakness.       Past Medical History:  Diagnosis Date  . Arthritis    osteo  . Bipolar disorder ([place]) 05/21/14  . Cataracts, bilateral   . DDD (degenerative disc disease), cervical    also back  . Depression   . Dizziness    Positional  . Edema    feet/legs  . Fibromyalgia syndrome   . Fungal infection    Finger nails  . GERD (gastroesophageal reflux disease)   . Headache    seasonal allergies  . Heart palpitations   . 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 Hypercholesterolemia   . Multiple sclerosis ([facility])    weakness  . Nephrolithiasis    kidney stones  . Osteoporosis    osteoarthritis  . Pneumonia   . PONV (postoperative nausea and vomiting)    no problem after cataract surgery  . Psoriasis   . Renal stone   . Shortness of breath dyspnea    wheezing  . Sleep apnea 2012   sleep study / slight, no interventions  . Urinary frequency      Social History   Social History  . Marital status: Divorced    Spouse name: N/A  . Number of children: 1  . Years of education: N/A   Occupational History  . 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 Smoking status: Former Smoker    Packs/day: 0.50    Years: 25.00    Types: Cigarettes    Quit date: 11/16/2012  . Smokeless tobacco: Never Used     Comment: recenty 04/26/2014  . Alcohol use No  . Drug use: No  . Sexual activity: No   Other Topics Concern  . Not on file   Social History Narrative  . No narrative on file    Past Surgical  History:  Procedure Laterality Date  . CATARACT EXTRACTION W/PHACO Left 05/21/2015   Procedure: CATARACT EXTRACTION PHACO AND INTRAOCULAR LENS PLACEMENT (IOC);  Surgeon: Birder Robson, MD;  Location: [facility];  Service: Ophthalmology;  Laterality: Left;  [place] 00:35 AP% 22.9 CDE 8.11 fluid pack lot [id number] H  . CATARACT EXTRACTION W/PHACO Right 06/04/2015   Procedure: CATARACT EXTRACTION PHACO AND INTRAOCULAR LENS PLACEMENT (IOC);  Surgeon: Birder Robson, MD;  Location: [facility];  Service: Ophthalmology;  Laterality: Right;  US:00:48 AP%: 10.5 CDE:5.08 Fluid lot [id number]  . EYE SURGERY  2015   tissue biopsy  . FOOT SURGERY  2015  . JOINT REPLACEMENT Left 2013   hip replacement  . LITHOTRIPSY    . PTOSIS REPAIR Bilateral 02/18/2016   Procedure: BILATERAL  PTOSIS REPAIR UPPER EYELIDS;  Surgeon: Karle Starch, MD;  Location: [facility];  Service: Ophthalmology;  Laterality: Bilateral;  LEAVE PT EARLY AM  . thumb surgery Right   . TONSILLECTOMY  1973    Family History  Problem Relation Age of Onset  . Cancer Father     Allergies  Allergen Reactions  . Albuterol Shortness Of Breath and Other (See Comments)    Makes pt feel jittery/ tacycardic  . Halcion [Triazolam] Other (See Comments)    Dizziness,headaches,bladder problems  . Levaquin [Levofloxacin In D5w] Diarrhea and Itching    Shoulder pain  . Naproxen Sodium Swelling  . Tylenol [Acetaminophen] Swelling  . Ultram [Tramadol Hcl] Itching and Nausea And Vomiting    dizzyness  . Aripiprazole Other (See Comments)    Pt not aware  . Cefaclor     Doesn't remember  . Diclofenac Sodium     Doesn't remember  . Sulfa Antibiotics     Doesn't remember  . Voltaren [Diclofenac]   . Ibuprofen Swelling    Can take small amounts    Current Outpatient Prescriptions on File Prior to Visit  Medication Sig Dispense Refill  . ASPERCREME LIDOCAINE EX Apply topically.    Marland Kitchen aspirin 325 MG tablet Take 325 mg by mouth as needed for headache.     Marland Kitchen BIOTIN PO Take 5,000 mg by mouth daily.     . calcipotriene-betamethasone (TACLONEX) ointment Apply topically daily. 60 g 0  . calcium carbonate (TUMS - DOSED IN MG ELEMENTAL CALCIUM) 500 MG chewable tablet Chew 2 tablets by mouth daily.     . cetirizine (ZYRTEC) 10 MG tablet Take 10 mg by mouth daily. pm    . Cholecalciferol (VITAMIN D-3 PO) Take 10,000 Units by mouth daily.     . ciclopirox (PENLAC) 8 % solution Apply nightly over nail and surrounding skin. Apply daily over prior coat. After 7 days, may remove with alcohol and continue 6.6 mL 3  . clobetasol cream (TEMOVATE) [id number] % Apply 1 application topically 2 (two) times daily. 30 g 2  . cyanocobalamin (,VITAMIN B-12,) 1000 MCG/ML injection Inject 1,000 mcg into the muscle once.    .  diclofenac sodium (VOLTAREN) 1 % GEL Apply 4 g topically 4 (four) times daily. (Patient taking differently: Apply 4 g topically as needed. ) 500 g 5  . DULoxetine (CYMBALTA) 20 MG capsule Take 40 mg by mouth daily. pm  4  . fluticasone (FLONASE) 50 MCG/ACT nasal spray Place 1 spray into both nostrils daily.    . hydroquinone 4 % cream Reported on 02/18/2016  3  . hydrOXYzine (VISTARIL) 50 MG capsule Take 100-150 mg by mouth at bedtime.     . lamoTRIgine (LAMICTAL)  150 MG tablet Take two tablets by mouth daily (Patient taking differently: Take two tablets by mouth daily/ pm) 180 tablet 3  . levalbuterol (XOPENEX HFA) 45 MCG/ACT inhaler Inhale 1-2 puffs into the lungs every 6 (six) hours as needed for wheezing. 1 Inhaler 5  . Lysine 1000 MG TABS Take 2,000 mg by mouth daily.     . nicotine (NICODERM CQ - DOSED IN MG/24 HOURS) 21 mg/24hr patch Place 1 patch (21 mg total) onto the skin daily. 28 patch 3  . omeprazole (PRILOSEC) 20 MG capsule Take 20 mg by mouth as needed.    . Phenazopyridine HCl (AZO TABS PO) Take 2 tablets by mouth as needed.     . polyethylene glycol powder (GLYCOLAX/MIRALAX) powder MIX 17 GRAMS (1 CAPFUL) WITH 4-8 OZ OF LIQUID AND TAKE BY MOUTH TWICE DAILY AS NEEDED 527 g 0  . ranitidine (ZANTAC) 150 MG tablet Take 150 mg by mouth 2 (two) times daily as needed for heartburn. Reported on 02/12/2016    . temazepam (RESTORIL) 30 MG capsule TAKE ONE TO TWO CAPSULES BY MOUTH NIGHTLY AT BEDTIME AS NEEDED  60 capsule 0  . tiZANidine (ZANAFLEX) 4 MG tablet TAKE 2 TABLETS (8 MG TOTAL) BY MOUTH 3 (THREE) TIMES DAILY. 180 tablet 5  . zolpidem (AMBIEN) 10 MG tablet Take 10 mg by mouth at bedtime as needed for sleep.    . Omega-3 Fatty Acids (FISH OIL PO) Take 4 capsules by mouth at bedtime.      No current facility-administered medications on file prior to visit.     BP 122/82   Pulse 93   Temp 98.7 F (37.1 C) (Oral)   Wt 212 lb (96.2 kg)   LMP 08/23/2014   SpO2 97%   BMI 31.31 kg/m      Objective:   Physical Exam  Constitutional: She appears well-nourished.  Cardiovascular: Normal rate and regular rhythm.   Pulmonary/Chest: Effort normal and breath sounds normal.  Genitourinary:  Genitourinary Comments: 1.5 cm vaginal abscess to right lower external labia. Mild erythema, deep, tender. Nonfluctuant.  Skin: Skin is warm and dry.          Assessment & Plan:  Vaginal abscess:  Located to right lower labia 3-4 days. Painful, inability to expel fluid on her own. Exam today with 1.5 cm deep, circular abscess, nonfluctuant. Treat with doxycycline course. Warm compresses. Return percussion provided.  Sheral Flow, NP

## 2016-08-13 NOTE — Assessment & Plan Note (Signed)
Refilled hydrocodone today. Reviewed patient on [place] substance reporting system and patient has been compliant to her medications. No suspicious activity. Currently taking Norco 10-3 25 every 6 hours scheduled as she has been doing so for years. Also following with sports medicine and orthopedics for regular corticosteroid joint injections. Will review case with supervising physician to confirm need for continued use of narcotics.

## 2016-08-13 NOTE — Assessment & Plan Note (Signed)
Appointment scheduled next week with neurologist in [place]. Continues to experience symptoms of weakness, falls, chronic pain.

## 2016-08-13 NOTE — Patient Instructions (Signed)
Start Doxycycline antibiotic. Take 1 tablet by mouth twice daily for 7 days for vaginal abscess.  Apply warm compresses to the area if possible.   Please notify me if no improvement in 3-4 days.  It was a pleasure to see you today!

## 2016-08-13 NOTE — Progress Notes (Signed)
Pre visit review using our clinic review tool, if applicable. No additional management support is needed unless otherwise documented below in the visit note. 

## 2016-08-13 NOTE — Assessment & Plan Note (Signed)
Due for repeat vitamin D next week. Patient to schedule lab only appointment.

## 2016-08-18 DIAGNOSIS — G35 Multiple sclerosis: Secondary | ICD-10-CM | POA: Diagnosis not present

## 2016-08-18 DIAGNOSIS — R4189 Other symptoms and signs involving cognitive functions and awareness: Secondary | ICD-10-CM | POA: Diagnosis not present

## 2016-08-18 DIAGNOSIS — F319 Bipolar disorder, unspecified: Secondary | ICD-10-CM | POA: Diagnosis not present

## 2016-08-18 DIAGNOSIS — Z87891 Personal history of nicotine dependence: Secondary | ICD-10-CM | POA: Diagnosis not present

## 2016-08-18 DIAGNOSIS — G3184 Mild cognitive impairment, so stated: Secondary | ICD-10-CM | POA: Diagnosis not present

## 2016-08-19 ENCOUNTER — Telehealth: Payer: Self-pay | Admitting: Primary Care

## 2016-08-19 NOTE — Telephone Encounter (Signed)
Patient finished antibiotic she was given but she took them with milk and found out from the pharmacist that she should not have taken them with milk.  So she is still having problems. She is also experiencing air coming out of her vagina. Please advise.

## 2016-08-20 ENCOUNTER — Ambulatory Visit (INDEPENDENT_AMBULATORY_CARE_PROVIDER_SITE_OTHER): Payer: PPO | Admitting: *Deleted

## 2016-08-20 ENCOUNTER — Ambulatory Visit: Payer: Self-pay

## 2016-08-20 DIAGNOSIS — E538 Deficiency of other specified B group vitamins: Secondary | ICD-10-CM

## 2016-08-20 MED ORDER — CYANOCOBALAMIN 1000 MCG/ML IJ SOLN
1000.0000 ug | INTRAMUSCULAR | Status: DC
  Administered 2016-08-20 – 2016-12-25 (×2): 1000 ug via INTRAMUSCULAR

## 2016-08-20 NOTE — Telephone Encounter (Signed)
This should have resolved the underlying infection, despite milk use. What problems is she having?

## 2016-08-20 NOTE — Progress Notes (Signed)
B12 1000mg /mL inj ordered on 05/22/16, Anda Kraft recommended pt continue b12 inj on 08/13/16 labs.

## 2016-08-20 NOTE — Telephone Encounter (Signed)
Message left for patient to return my call.  

## 2016-08-21 ENCOUNTER — Other Ambulatory Visit: Payer: Self-pay | Admitting: Primary Care

## 2016-08-21 ENCOUNTER — Ambulatory Visit: Payer: Self-pay | Admitting: Primary Care

## 2016-08-21 NOTE — Telephone Encounter (Signed)
Patient returned Chan's call.  Patient can be reached until 1:00 or after 4:00 at home [phone].

## 2016-08-21 NOTE — Telephone Encounter (Signed)
PT called again. Please call her.

## 2016-08-24 NOTE — Telephone Encounter (Signed)
Patient stated that the abscess is not getting any better. Still having pain. Patient wanted Anda Kraft to send in something to the pharmacy.  Patient stated that she wanted Anda Kraft to know that she has been gas from the vagina when she urinate. She notices it more often now.

## 2016-08-24 NOTE — Telephone Encounter (Signed)
Pt schedule appointment with kate 10/10

## 2016-08-24 NOTE — Telephone Encounter (Signed)
Patient called.  Patient's going to [employer] today and said to let her know if something can be called in to [employer].  Patient can be reached at [phone] or [phone].

## 2016-08-24 NOTE — Telephone Encounter (Signed)
Doxycycline is a very strong antibiotic and should have cleared up any abscess. I recommend she return for follow up if no improvement in symptoms. Gas from the vagina is not unusual and is generally nothing to be concerned about.

## 2016-08-25 ENCOUNTER — Encounter: Payer: Self-pay | Admitting: Primary Care

## 2016-08-25 ENCOUNTER — Ambulatory Visit (INDEPENDENT_AMBULATORY_CARE_PROVIDER_SITE_OTHER): Payer: PPO | Admitting: Primary Care

## 2016-08-25 VITALS — BP 116/78 | HR 93 | Temp 99.0°F | Ht 69.0 in | Wt 215.8 lb

## 2016-08-25 DIAGNOSIS — L0291 Cutaneous abscess, unspecified: Secondary | ICD-10-CM

## 2016-08-25 DIAGNOSIS — Z09 Encounter for follow-up examination after completed treatment for conditions other than malignant neoplasm: Secondary | ICD-10-CM

## 2016-08-25 NOTE — Progress Notes (Signed)
Pre visit review using our clinic review tool, if applicable. No additional management support is needed unless otherwise documented below in the visit note. 

## 2016-08-25 NOTE — Patient Instructions (Signed)
The abscess has healed well.   Please notify me if you notice it become larger, more painful, you develop fevers.  It was a pleasure to see you today!

## 2016-08-25 NOTE — Progress Notes (Signed)
Subjective:    Patient ID: Emily Moon, female    DOB: May 21, 1961, 55 y.o.   MRN: [id number]  HPI  Emily Moon is a 55 year old female who presents today with a chief complaint of vaginal discomfort. She was evaluated and treated for a vaginal abscess located to her right lower labia. She was prescribed Doxycycline and completed the course. She called in after completion of Doxycycline reporting no resolve and stating that the pharmacist told her that she should not have taken this medication with milk which she had done so. She became worried that the medication was not effective.  Since her last visit she's noticed improvement in size and tenderness, but can feel a firm knot to the site. Denies fevers, increased pain, increase in size. She's been taking warm showers and running the shower head with warm water over the site.  Review of Systems  Constitutional: Negative for fatigue and fever.  Genitourinary:       Knot to right labia at abscess site  Skin: Negative for color change.       Past Medical History:  Diagnosis Date  . Arthritis    osteo  . Bipolar disorder ([place]) 05/21/14  . Cataracts, bilateral   . DDD (degenerative disc disease), cervical    also back  . Depression   . Dizziness    Positional  . Edema    feet/legs  . Fibromyalgia syndrome   . Fungal infection    Finger nails  . GERD (gastroesophageal reflux disease)   . Headache    seasonal allergies  . Heart palpitations   . 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 Hypercholesterolemia   . Multiple sclerosis (HCC)    weakness  . Nephrolithiasis    kidney stones  . Osteoporosis    osteoarthritis  . Pneumonia   . PONV (postoperative nausea and vomiting)    no problem after cataract surgery  . Psoriasis   . Renal stone   . Shortness of breath dyspnea    wheezing  . Sleep apnea 2012   sleep study / slight, no interventions  . Urinary frequency      Social History   Social History  . Marital  status: Divorced    Spouse name: N/A  . Number of children: 1  . Years of education: N/A   Occupational History  . 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 Smoking status: Former Smoker    Packs/day: 0.50    Years: 25.00    Types: Cigarettes    Quit date: 11/16/2012  . Smokeless tobacco: Never Used     Comment: recenty 04/26/2014  . Alcohol use No  . Drug use: No  . Sexual activity: No   Other Topics Concern  . Not on file   Social History Narrative  . No narrative on file    Past Surgical History:  Procedure Laterality Date  . CATARACT EXTRACTION W/PHACO Left 05/21/2015   Procedure: CATARACT EXTRACTION PHACO AND INTRAOCULAR LENS PLACEMENT (IOC);  Surgeon: Birder Robson, MD;  Location: [facility];  Service: Ophthalmology;  Laterality: Left;  [place] 00:35 AP% 22.9 CDE 8.11 fluid pack lot [id number] H  . CATARACT EXTRACTION W/PHACO Right 06/04/2015   Procedure: CATARACT EXTRACTION PHACO AND INTRAOCULAR LENS PLACEMENT (IOC);  Surgeon: Birder Robson, MD;  Location: [facility];  Service: Ophthalmology;  Laterality: Right;  US:00:48 AP%: 10.5 CDE:5.08 Fluid lot [id number] H  . EYE SURGERY  2015   tissue  biopsy  . FOOT SURGERY  2015  . JOINT REPLACEMENT Left 2013   hip replacement  . LITHOTRIPSY    . PTOSIS REPAIR Bilateral 02/18/2016   Procedure: BILATERAL PTOSIS REPAIR UPPER EYELIDS;  Surgeon: Karle Starch, MD;  Location: [facility];  Service: Ophthalmology;  Laterality: Bilateral;  LEAVE PT EARLY AM  . thumb surgery Right   . TONSILLECTOMY  1973    Family History  Problem Relation Age of Onset  . Cancer Father     Allergies  Allergen Reactions  . Albuterol Shortness Of Breath and Other (See Comments)    Makes pt feel jittery/ tacycardic  . Halcion [Triazolam] Other (See Comments)    Dizziness,headaches,bladder problems  . Levaquin [Levofloxacin In D5w] Diarrhea and Itching    Shoulder pain  . Naproxen Sodium Swelling  . Tylenol  [Acetaminophen] Swelling  . Ultram [Tramadol Hcl] Itching and Nausea And Vomiting    dizzyness  . Aripiprazole Other (See Comments)    Pt not aware  . Cefaclor     Doesn't remember  . Diclofenac Sodium     Doesn't remember  . Sulfa Antibiotics     Doesn't remember  . Voltaren [Diclofenac]   . Ibuprofen Swelling    Can take small amounts    Current Outpatient Prescriptions on File Prior to Visit  Medication Sig Dispense Refill  . ASPERCREME LIDOCAINE EX Apply topically.    Marland Kitchen aspirin 325 MG tablet Take 325 mg by mouth as needed for headache.     Marland Kitchen BIOTIN PO Take 5,000 mg by mouth daily.     . calcium carbonate (TUMS - DOSED IN MG ELEMENTAL CALCIUM) 500 MG chewable tablet Chew 2 tablets by mouth daily.     . cetirizine (ZYRTEC) 10 MG tablet Take 10 mg by mouth daily. pm    . Cholecalciferol (VITAMIN D-3 PO) Take 10,000 Units by mouth daily.     . ciclopirox (PENLAC) 8 % solution Apply nightly over nail and surrounding skin. Apply daily over prior coat. After 7 days, may remove with alcohol and continue 6.6 mL 3  . clobetasol cream (TEMOVATE) AB-123456789 % Apply 1 application topically 2 (two) times daily. 30 g 2  . diclofenac sodium (VOLTAREN) 1 % GEL Apply 4 g topically 4 (four) times daily. (Patient taking differently: Apply 4 g topically as needed. ) 500 g 5  . DULoxetine (CYMBALTA) 20 MG capsule Take 40 mg by mouth daily. pm  4  . fluticasone (FLONASE) 50 MCG/ACT nasal spray Place 1 spray into both nostrils daily.    Marland Kitchen HYDROcodone-acetaminophen (NORCO) 10-325 MG tablet Take 1 tablet by mouth every 6 (six) hours as needed for severe pain. 90 tablet 0  . hydroquinone 4 % cream Reported on 02/18/2016  3  . hydrOXYzine (VISTARIL) 50 MG capsule Take 100-150 mg by mouth at bedtime.     . lamoTRIgine (LAMICTAL) 150 MG tablet Take two tablets by mouth daily (Patient taking differently: Take two tablets by mouth daily/ pm) 180 tablet 3  . levalbuterol (XOPENEX HFA) 45 MCG/ACT inhaler Inhale 1-2 puffs  into the lungs every 6 (six) hours as needed for wheezing. 1 Inhaler 5  . Lysine 1000 MG TABS Take 2,000 mg by mouth daily.     . nicotine (NICODERM CQ - DOSED IN MG/24 HOURS) 21 mg/24hr patch Place 1 patch (21 mg total) onto the skin daily. 28 patch 3  . Omega-3 Fatty Acids (FISH OIL PO) Take 4 capsules by mouth at bedtime.     Marland Kitchen  omeprazole (PRILOSEC) 20 MG capsule Take 20 mg by mouth as needed.    . Phenazopyridine HCl (AZO TABS PO) Take 2 tablets by mouth as needed.     . polyethylene glycol powder (GLYCOLAX/MIRALAX) powder MIX 17 GRAMS (1 CAPFUL) WITH 4-8 OZ OF LIQUID AND TAKE BY MOUTH TWICE DAILY AS NEEDED 527 g 0  . ranitidine (ZANTAC) 150 MG tablet Take 150 mg by mouth 2 (two) times daily as needed for heartburn. Reported on 02/12/2016    . temazepam (RESTORIL) 30 MG capsule TAKE ONE TO TWO CAPSULES BY MOUTH NIGHTLY AT BEDTIME AS NEEDED  60 capsule 0  . tiZANidine (ZANAFLEX) 4 MG tablet TAKE 2 TABLETS (8 MG TOTAL) BY MOUTH 3 (THREE) TIMES DAILY. 180 tablet 5  . zolpidem (AMBIEN) 10 MG tablet Take 10 mg by mouth at bedtime as needed for sleep.     Current Facility-Administered Medications on File Prior to Visit  Medication Dose Route Frequency Provider Last Rate Last Dose  . cyanocobalamin ((VITAMIN B-12)) injection 1,000 mcg  1,000 mcg Intramuscular Q30 days [name], NP   1,000 mcg at 08/20/16 1500    BP 116/78   Pulse 93   Temp 99 F (37.2 C) (Oral)   Ht 5\' 9"  (1.753 m)   Wt 215 lb 12.8 oz (97.9 kg)   LMP 08/23/2014   SpO2 96%   BMI 31.87 kg/m    Objective:   Physical Exam  Constitutional: She appears well-nourished.  Cardiovascular: Normal rate.   Pulmonary/Chest: Effort normal.  Skin: Skin is warm and dry.  Much improvement in size and color of abscess. Non tender. Nearly healed.           Assessment & Plan:  Vaginal Abscess:  Located to right labia.  Treated with Doxycycline course x 10 days on 09/28. Exam today with significant improvement and  reduction. Small, firm mass present. Does not appear infectious.  Continue conservative treatment, no further antibiotics warranted. Return precautions provided.  Sheral Flow, NP

## 2016-08-26 ENCOUNTER — Ambulatory Visit: Payer: Self-pay

## 2016-09-03 DIAGNOSIS — F3162 Bipolar disorder, current episode mixed, moderate: Secondary | ICD-10-CM | POA: Diagnosis not present

## 2016-09-07 ENCOUNTER — Emergency Department (HOSPITAL_COMMUNITY): Payer: PPO

## 2016-09-07 ENCOUNTER — Encounter (HOSPITAL_COMMUNITY): Payer: Self-pay

## 2016-09-07 ENCOUNTER — Emergency Department (HOSPITAL_COMMUNITY)
Admission: EM | Admit: 2016-09-07 | Discharge: 2016-09-07 | Disposition: A | Discharge status: Home / Self Care | Payer: PPO | Attending: Emergency Medicine | Admitting: Emergency Medicine

## 2016-09-07 DIAGNOSIS — Z87891 Personal history of nicotine dependence: Secondary | ICD-10-CM | POA: Insufficient documentation

## 2016-09-07 DIAGNOSIS — R319 Hematuria, unspecified: Secondary | ICD-10-CM | POA: Diagnosis not present

## 2016-09-07 DIAGNOSIS — R109 Unspecified abdominal pain: Secondary | ICD-10-CM | POA: Diagnosis not present

## 2016-09-07 DIAGNOSIS — Z96642 Presence of left artificial hip joint: Secondary | ICD-10-CM | POA: Insufficient documentation

## 2016-09-07 DIAGNOSIS — Z7982 Long term (current) use of aspirin: Secondary | ICD-10-CM | POA: Diagnosis not present

## 2016-09-07 DIAGNOSIS — N201 Calculus of ureter: Secondary | ICD-10-CM | POA: Insufficient documentation

## 2016-09-07 DIAGNOSIS — R1032 Left lower quadrant pain: Secondary | ICD-10-CM | POA: Diagnosis not present

## 2016-09-07 DIAGNOSIS — N202 Calculus of kidney with calculus of ureter: Secondary | ICD-10-CM | POA: Diagnosis not present

## 2016-09-07 LAB — CBC WITH DIFFERENTIAL/PLATELET
Basophils Absolute: 0.1 10*3/uL (ref 0.0–0.1)
Basophils Relative: 0 %
EOS PCT: 1 %
Eosinophils Absolute: 0.1 10*3/uL (ref 0.0–0.7)
HCT: 40.4 % (ref 36.0–46.0)
Hemoglobin: 13.7 g/dL (ref 12.0–15.0)
LYMPHS ABS: 2 10*3/uL (ref 0.7–4.0)
LYMPHS PCT: 16 %
MCH: 31.3 pg (ref 26.0–34.0)
MCHC: 33.9 g/dL (ref 30.0–36.0)
MCV: 92.2 fL (ref 78.0–100.0)
MONO ABS: 0.5 10*3/uL (ref 0.1–1.0)
Monocytes Relative: 4 %
Neutro Abs: 9.8 10*3/uL — ABNORMAL HIGH (ref 1.7–7.7)
Neutrophils Relative %: 79 %
PLATELETS: 368 10*3/uL (ref 150–400)
RBC: 4.38 MIL/uL (ref 3.87–5.11)
RDW: 13.2 % (ref 11.5–15.5)
WBC: 12.5 10*3/uL — ABNORMAL HIGH (ref 4.0–10.5)

## 2016-09-07 LAB — URINALYSIS, ROUTINE W REFLEX MICROSCOPIC
BILIRUBIN URINE: NEGATIVE
GLUCOSE, UA: NEGATIVE mg/dL
KETONES UR: 15 mg/dL — AB
Nitrite: NEGATIVE
Protein, ur: 30 mg/dL — AB
Specific Gravity, Urine: 1.018 (ref 1.005–1.030)
pH: 7 (ref 5.0–8.0)

## 2016-09-07 LAB — BASIC METABOLIC PANEL
Anion gap: 13 (ref 5–15)
BUN: 17 mg/dL (ref 6–20)
CO2: 21 mmol/L — ABNORMAL LOW (ref 22–32)
Calcium: 9.6 mg/dL (ref 8.9–10.3)
Chloride: 103 mmol/L (ref 101–111)
Creatinine, Ser: 1.12 mg/dL — ABNORMAL HIGH (ref 0.44–1.00)
GFR calc Af Amer: >60 mL/min (ref >60)
GFR, EST NON AFRICAN AMERICAN: 55 mL/min — AB (ref >60)
GLUCOSE: 168 mg/dL — AB (ref 65–99)
POTASSIUM: 4.1 mmol/L (ref 3.5–5.1)
Sodium: 137 mmol/L (ref 135–145)

## 2016-09-07 LAB — URINE MICROSCOPIC-ADD ON

## 2016-09-07 IMAGING — CT CT RENAL STONE PROTOCOL
2 of 4 series · 16 of 46 positions shown, 18 images · non-contrast
Comparison: [DATE]

CLINICAL DATA: Left side back pain beginning last night, left flank
pain.

EXAM:
CT ABDOMEN AND PELVIS WITHOUT CONTRAST
TECHNIQUE: Multidetector CT imaging of the abdomen and pelvis was performed
following the standard protocol without IV contrast.

[Series 2: renal stone 5mm · axial · 0.95mm/px · z∈[+916,+1301]mm · 13 of 85 slices shown, 15 images]
[im 4/85  soft-tissue]
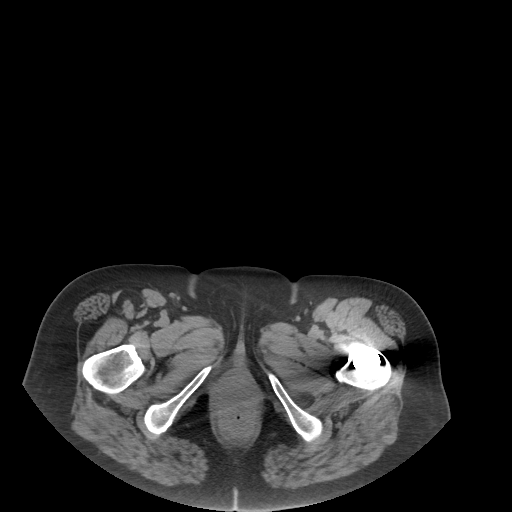
[im 4/85  bone]
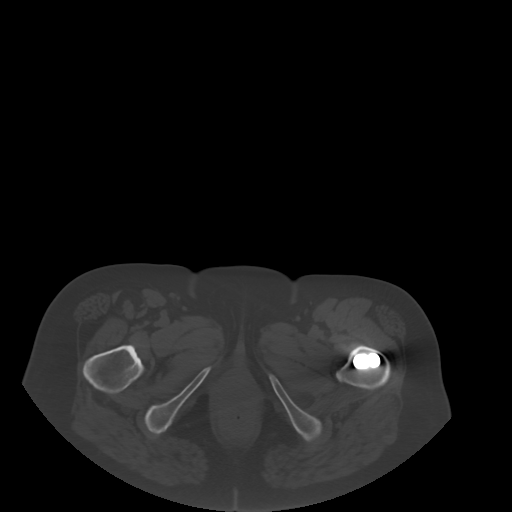
[im 10/85  soft-tissue]
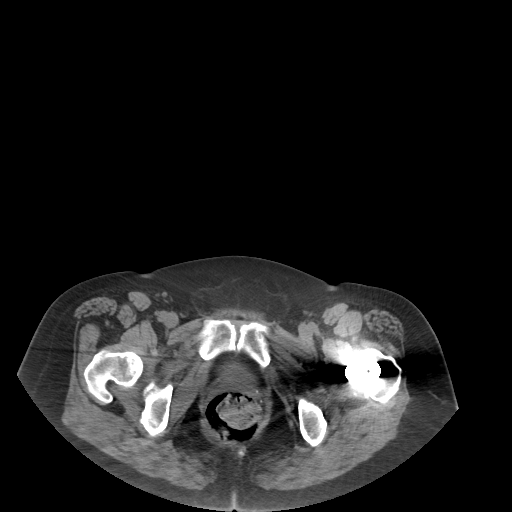
[im 17/85  soft-tissue]
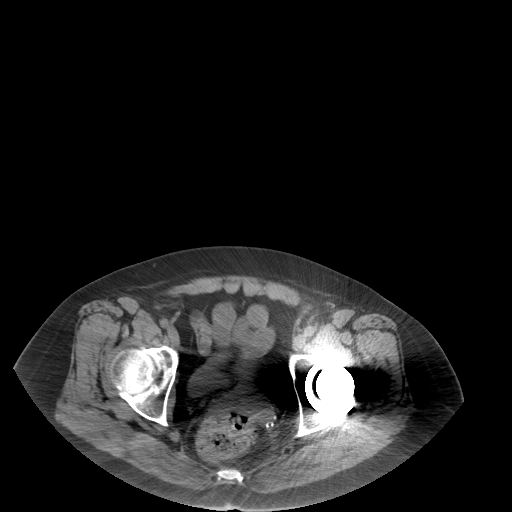
[im 23/85  soft-tissue]
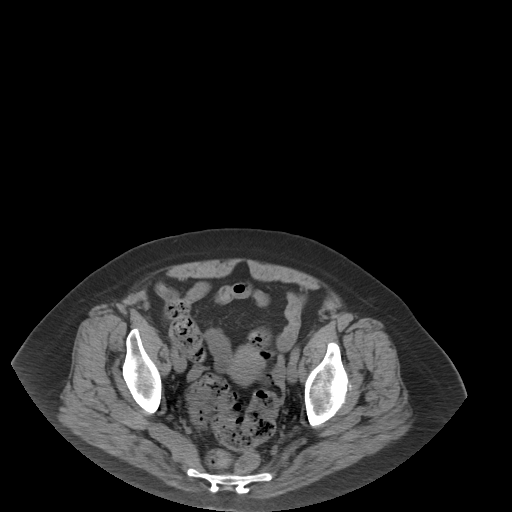
[im 30/85  soft-tissue]
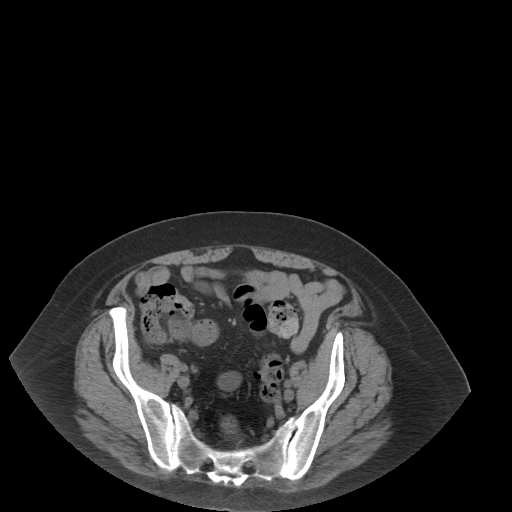
[im 36/85  soft-tissue]
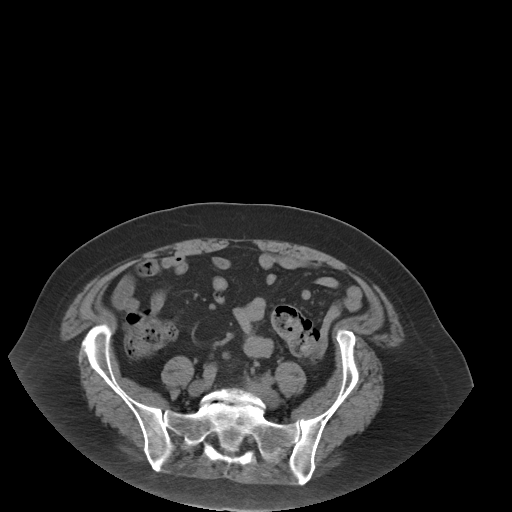
[im 43/85  soft-tissue]
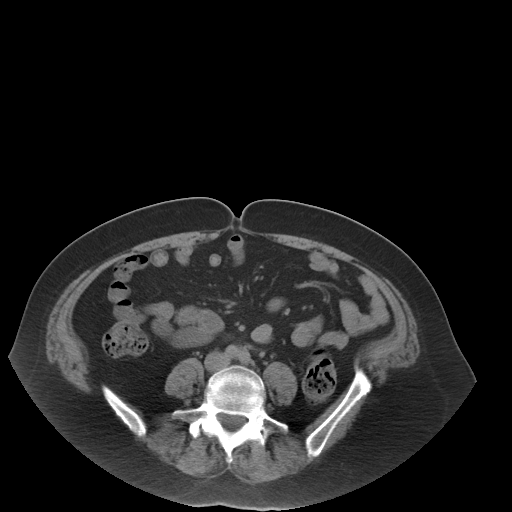
[im 49/85  soft-tissue]
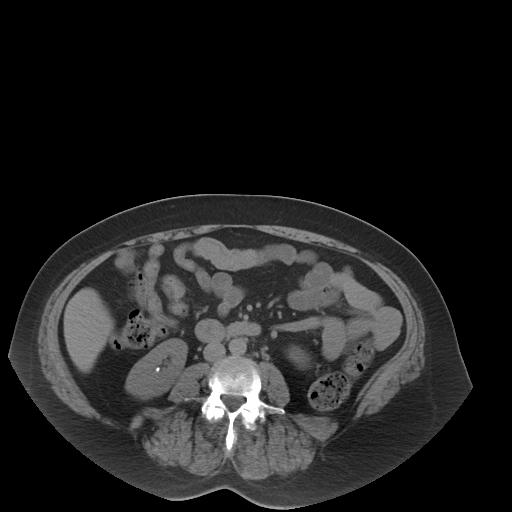
[im 55/85  soft-tissue]
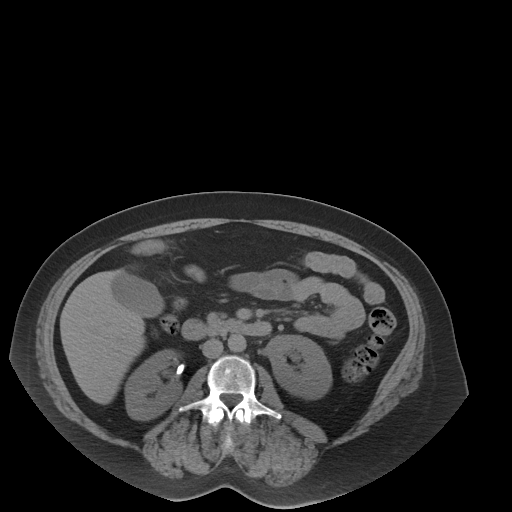
[im 55/85  bone]
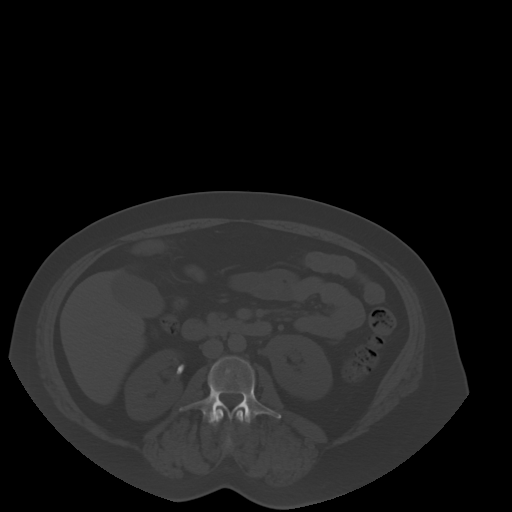
[im 62/85  soft-tissue]
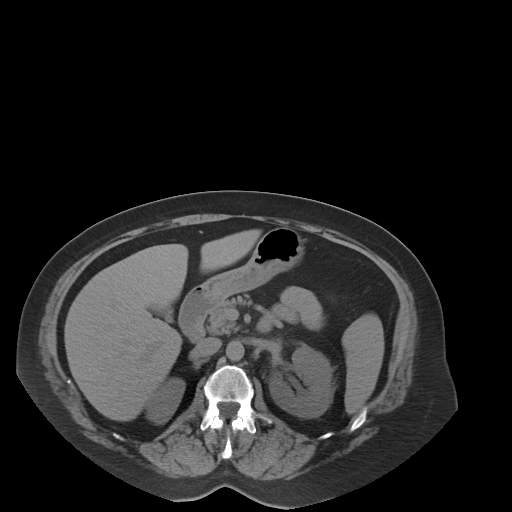
[im 68/85  soft-tissue]
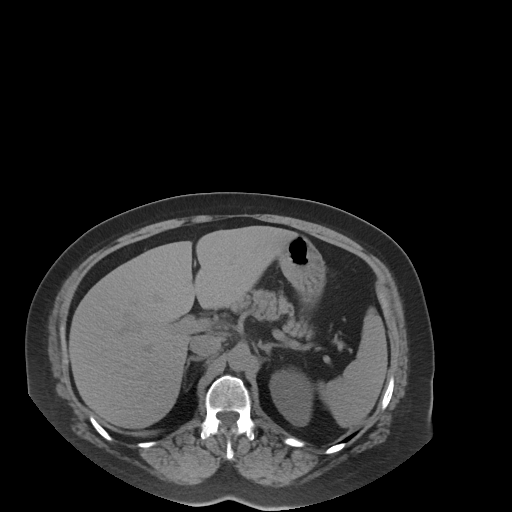
[im 75/85  soft-tissue]
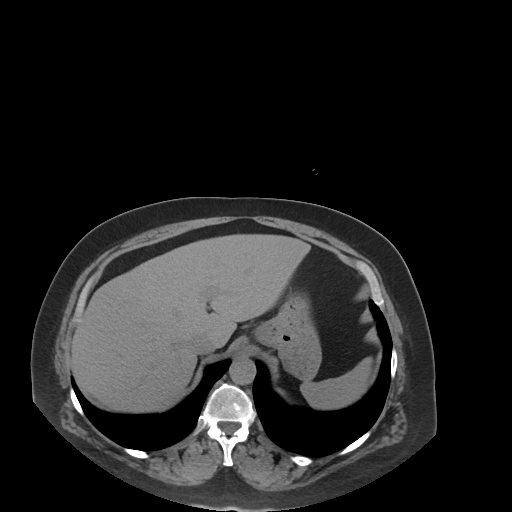
[im 81/85  soft-tissue]
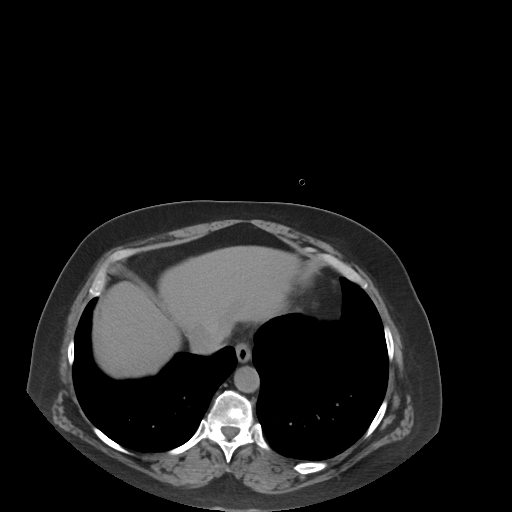

[Series 4: renal stone 3.0 cor · coronal · 0.67mm/px · 3 of 97 slices shown]
[im 33/97  soft-tissue]
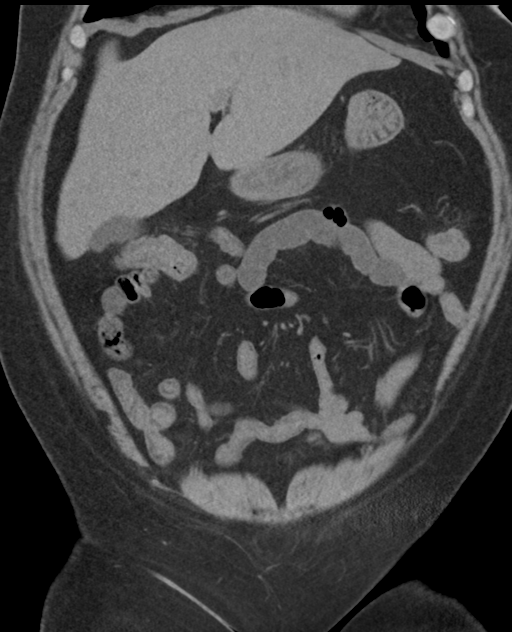
[im 43/97  soft-tissue]
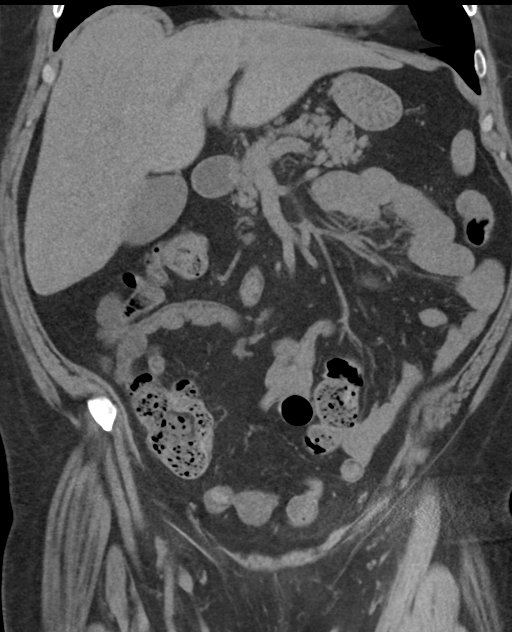
[im 54/97  soft-tissue]
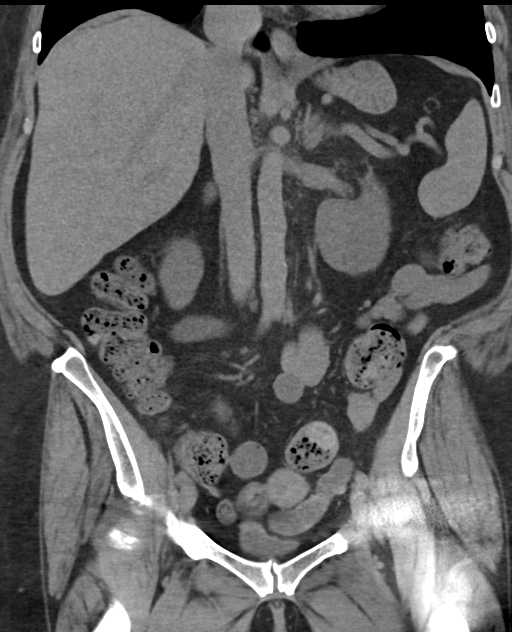

[16 of 46 positions shown; findings below may reference images not displayed]

FINDINGS: Lower chest: Lung bases are clear. No effusions. Heart is normal
size.

Hepatobiliary: No focal hepatic abnormality. Gallbladder
unremarkable.

Pancreas: No focal abnormality or ductal dilatation.

Spleen: No focal abnormality.  Normal size.

Adrenals/Urinary Tract: 8 mm right renal pelvic stone without
hydronephrosis. Other smaller punctate nonobstructing right renal
stones. There is a punctate 1-2 mm proximal left ureteral stone with
mild fullness of the left renal collecting system. Adrenal glands
and urinary bladder unremarkable.

Stomach/Bowel: Stomach, large and small bowel grossly unremarkable.

Vascular/Lymphatic: No evidence of aneurysm or adenopathy.

Reproductive: Uterus and adnexa unremarkable.  No mass.

Other: No free fluid or free air.

Musculoskeletal: Prior left hip replacement. Degenerative disc and
facet disease in the lower lumbar spine. Slight anterolisthesis of
L4 on L5 related to facet disease.
IMPRESSION: 8 mm right renal pelvic stone without hydronephrosis. 1-2 mm
proximal left ureteral stone with mild fullness of the left renal
collecting system.

Right nephrolithiasis.

## 2016-09-07 MED ORDER — KETOROLAC TROMETHAMINE 10 MG PO TABS: 10.0000 mg | ORAL_TABLET | Freq: Four times a day (QID) | ORAL | 0 refills | Status: DC | PRN

## 2016-09-07 MED ORDER — KETOROLAC TROMETHAMINE 30 MG/ML IJ SOLN
30.0000 mg | Freq: Once | INTRAMUSCULAR | Status: AC
  Administered 2016-09-07: 30 mg via INTRAVENOUS
  Filled 2016-09-07: qty 1

## 2016-09-07 MED ORDER — ONDANSETRON HCL 4 MG/2ML IJ SOLN
4.0000 mg | Freq: Once | INTRAMUSCULAR | Status: AC
  Administered 2016-09-07: 4 mg via INTRAVENOUS
  Filled 2016-09-07: qty 2

## 2016-09-07 MED ORDER — ONDANSETRON 4 MG PO TBDP: 4.0000 mg | ORAL_TABLET | Freq: Three times a day (TID) | ORAL | 0 refills | Status: DC | PRN

## 2016-09-07 MED ORDER — TAMSULOSIN HCL 0.4 MG PO CAPS: 0.4000 mg | ORAL_CAPSULE | Freq: Two times a day (BID) | ORAL | 0 refills | Status: DC

## 2016-09-07 MED ORDER — HYDROMORPHONE HCL 2 MG/ML IJ SOLN
1.0000 mg | Freq: Once | INTRAMUSCULAR | Status: AC
  Administered 2016-09-07: 1 mg via INTRAVENOUS
  Filled 2016-09-07: qty 1

## 2016-09-07 NOTE — ED Notes (Signed)
Pt attempted to use BSC and can not void.

## 2016-09-07 NOTE — ED Provider Notes (Signed)
[facility] DEPT Provider Note   CSN: [id number] Arrival date & time: 09/07/16  [id number]     History   Chief Complaint Chief Complaint  Patient presents with  . Flank Pain    HPI Emily Moon is a 55 y.o. female.  The history is provided by the patient and medical records.  Flank Pain    55 year old female with history of arthritis, bipolar disorder, fibromyalgia, GERD, MS, kidney stones, presenting to the ED for left flank pain. She reports this began yesterday evening around 6 PM. This was sudden onset, localized to her left flank. States some radiation to her left lower abdomen. Reports nausea and vomiting began this morning at 3 AM. Denies any fever or chills. No difficulty urinating. Does report history of kidney stones in the past. Has previously had large stones which required hospitalization and stenting. She's not had any medications thus far this morning. Past Medical History:  Diagnosis Date  . Arthritis    osteo  . Bipolar disorder ([place]) 05/21/14  . Cataracts, bilateral   . DDD (degenerative disc disease), cervical    also back  . Depression   . Dizziness    Positional  . Edema    feet/legs  . Fibromyalgia syndrome   . Fungal infection    Finger nails  . GERD (gastroesophageal reflux disease)   . Headache    seasonal allergies  . Heart palpitations   . 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 Hypercholesterolemia   . Multiple sclerosis (HCC)    weakness  . Nephrolithiasis    kidney stones  . Osteoporosis    osteoarthritis  . Pneumonia   . PONV (postoperative nausea and vomiting)    no problem after cataract surgery  . Psoriasis   . Renal stone   . Shortness of breath dyspnea    wheezing  . Sleep apnea 2012   sleep study / slight, no interventions  . Urinary frequency     Patient Active Problem List   Diagnosis Date Noted  . B12 deficiency 05/22/2016  . Dizziness 03/16/2016  . Back pain 10/31/2014  . Bipolar disorder ([place]) 05/21/2014    . 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 Insomnia 09/01/2011  . Obesity 05/04/2011  . Perimenopausal vasomotor symptoms 02/05/2011  . HLD (hyperlipidemia) 01/20/2011  . SMOKER 01/20/2011  . Chronic pain syndrome 01/07/2011  . RENAL CALCULUS, RECURRENT 11/13/2010  . MULTIPLE SCLEROSIS, PROGRESSIVE/RELAPSING 08/28/2010  . GERD 08/28/2010  . Osteoarthritis 08/28/2010  . NEPHROLITHIASIS, HX OF 08/28/2010    Past Surgical History:  Procedure Laterality Date  . CATARACT EXTRACTION W/PHACO Left 05/21/2015   Procedure: CATARACT EXTRACTION PHACO AND INTRAOCULAR LENS PLACEMENT (IOC);  Surgeon: Birder Robson, MD;  Location: [facility];  Service: Ophthalmology;  Laterality: Left;  [place] 00:35 AP% 22.9 CDE 8.11 fluid pack lot [id number] H  . CATARACT EXTRACTION W/PHACO Right 06/04/2015   Procedure: CATARACT EXTRACTION PHACO AND INTRAOCULAR LENS PLACEMENT (IOC);  Surgeon: Birder Robson, MD;  Location: [facility];  Service: Ophthalmology;  Laterality: Right;  US:00:48 AP%: 10.5 CDE:5.08 Fluid lot [id number] H  . EYE SURGERY  2015   tissue biopsy  . FOOT SURGERY  2015  . JOINT REPLACEMENT Left 2013   hip replacement  . LITHOTRIPSY    . PTOSIS REPAIR Bilateral 02/18/2016   Procedure: BILATERAL PTOSIS REPAIR UPPER EYELIDS;  Surgeon: Karle Starch, MD;  Location: [facility];  Service: Ophthalmology;  Laterality: Bilateral;  LEAVE PT EARLY AM  . thumb surgery Right   . TONSILLECTOMY  1973    OB History    No data available       Home Medications    Prior to Admission medications   Medication Sig Start Date End Date Taking? Authorizing Provider  ASPERCREME LIDOCAINE EX Apply topically.    Historical Provider, MD  aspirin 325 MG tablet Take 325 mg by mouth as needed for headache.     Historical Provider, MD  BIOTIN PO Take 5,000 mg by mouth daily.     Historical Provider, MD  calcium  carbonate (TUMS - DOSED IN MG ELEMENTAL CALCIUM) 500 MG chewable tablet Chew 2 tablets by mouth daily.     Historical Provider, MD  cetirizine (ZYRTEC) 10 MG tablet Take 10 mg by mouth daily. pm    Historical Provider, MD  Cholecalciferol (VITAMIN D-3 PO) Take 10,000 Units by mouth daily.     Historical Provider, MD  ciclopirox (PENLAC) 8 % solution Apply nightly over nail and surrounding skin. Apply daily over prior coat. After 7 days, may remove with alcohol and continue 07/15/15   Owens Loffler, MD  clobetasol cream (TEMOVATE) AB-123456789 % Apply 1 application topically 2 (two) times daily. 03/11/15   Owens Loffler, MD  diclofenac sodium (VOLTAREN) 1 % GEL Apply 4 g topically 4 (four) times daily. Patient taking differently: Apply 4 g topically as needed.  11/15/14   Jearld Fenton, NP  DULoxetine (CYMBALTA) 20 MG capsule Take 40 mg by mouth daily. pm 05/28/15   Historical Provider, MD  fluticasone (FLONASE) 50 MCG/ACT nasal spray Place 1 spray into both nostrils daily.    Historical Provider, MD  HYDROcodone-acetaminophen (NORCO) 10-325 MG tablet Take 1 tablet by mouth every 6 (six) hours as needed for severe pain. 08/13/16   [name], NP  hydroquinone 4 % cream Reported on 02/18/2016 05/16/15   Historical Provider, MD  hydrOXYzine (VISTARIL) 50 MG capsule Take 100-150 mg by mouth at bedtime.     Historical Provider, MD  lamoTRIgine (LAMICTAL) 150 MG tablet Take two tablets by mouth daily Patient taking differently: Take two tablets by mouth daily/ pm 10/10/13   Jearld Fenton, NP  levalbuterol [facility] HFA) 45 MCG/ACT inhaler Inhale 1-2 puffs into the lungs every 6 (six) hours as needed for wheezing. 11/12/15   [name], NP  Lysine 1000 MG TABS Take 2,000 mg by mouth daily.     Historical Provider, MD  nicotine (NICODERM CQ - DOSED IN MG/24 HOURS) 21 mg/24hr patch Place 1 patch (21 mg total) onto the skin daily. 02/20/16   [name], NP  Omega-3 Fatty Acids (FISH OIL PO) Take 4  capsules by mouth at bedtime.     Historical Provider, MD  omeprazole (PRILOSEC) 20 MG capsule Take 20 mg by mouth as needed.    Historical Provider, MD  Phenazopyridine HCl (AZO TABS PO) Take 2 tablets by mouth as needed.     Historical Provider, MD  polyethylene glycol powder (GLYCOLAX/MIRALAX) powder MIX 17 GRAMS (1 CAPFUL) WITH 4-8 OZ OF LIQUID AND TAKE BY MOUTH TWICE DAILY AS NEEDED 08/24/16   [name], NP  ranitidine (ZANTAC) 150 MG tablet Take 150 mg by mouth 2 (two) times daily as needed for heartburn. Reported on 02/12/2016    Historical Provider, MD  temazepam (RESTORIL) 30 MG capsule TAKE ONE TO TWO CAPSULES BY MOUTH NIGHTLY AT BEDTIME AS NEEDED  07/05/14  Lucille Passy, MD  tiZANidine (ZANAFLEX) 4 MG tablet TAKE 2 TABLETS (8 MG TOTAL) BY MOUTH 3 (THREE) TIMES DAILY. 05/29/16   [name], NP  zolpidem (AMBIEN) 10 MG tablet Take 10 mg by mouth at bedtime as needed for sleep.    Historical Provider, MD    Family History Family History  Problem Relation Age of Onset  . Cancer Father     Social History Social History  Substance Use Topics  . Smoking status: Former Smoker    Packs/day: 0.50    Years: 25.00    Types: Cigarettes    Quit date: 11/16/2012  . Smokeless tobacco: Never Used     Comment: recenty 04/26/2014  . Alcohol use No     Allergies   Albuterol; Halcion [triazolam]; Levaquin [levofloxacin in d5w]; Naproxen sodium; Tylenol [acetaminophen]; Ultram [tramadol hcl]; Aripiprazole; Cefaclor; Diclofenac sodium; Sulfa antibiotics; Voltaren [diclofenac]; and Ibuprofen   Review of Systems Review of Systems  Gastrointestinal: Positive for nausea and vomiting.  Genitourinary: Positive for flank pain.  All other systems reviewed and are negative.    Physical Exam Updated Vital Signs BP 151/82 (BP Location: Left Arm)   Pulse 88   Temp 98.5 F (36.9 C) (Oral)   Resp 24   Ht 5\' 9"  (1.753 m)   Wt 95.3 kg   LMP 08/23/2014   SpO2 99%   BMI 31.01 kg/m    Physical Exam  Constitutional: She is oriented to person, place, and time. She appears well-developed and well-nourished.  Appears uncomfortable, actively vomiting in room  HENT:  Head: Normocephalic and atraumatic.  Mouth/Throat: Oropharynx is clear and moist.  Eyes: Conjunctivae and EOM are normal. Pupils are equal, round, and reactive to light.  Neck: Normal range of motion.  Cardiovascular: Normal rate, regular rhythm and normal heart sounds.   Pulmonary/Chest: Effort normal and breath sounds normal.  Abdominal: Soft. Bowel sounds are normal. There is tenderness in the left lower quadrant. There is CVA tenderness.  Musculoskeletal: Normal range of motion.  Neurological: She is alert and oriented to person, place, and time.  Skin: Skin is warm and dry.  Psychiatric: She has a normal mood and affect.  Nursing note and vitals reviewed.    ED Treatments / Results  Labs (all labs ordered are listed, but only abnormal results are displayed) Labs Reviewed  CBC WITH DIFFERENTIAL/PLATELET - Abnormal; Notable for the following:       Result Value   WBC 12.5 (*)    Neutro Abs 9.8 (*)    All other components within normal limits  BASIC METABOLIC PANEL - Abnormal; Notable for the following:    CO2 21 (*)    Glucose, Bld 168 (*)    Creatinine, Ser 1.12 (*)    GFR calc non Af Amer 55 (*)    All other components within normal limits  URINALYSIS, ROUTINE W REFLEX MICROSCOPIC (NOT AT [facility]) - Abnormal; Notable for the following:    APPearance CLOUDY (*)    Hgb urine dipstick LARGE (*)    Ketones, ur 15 (*)    Protein, ur 30 (*)    Leukocytes, UA TRACE (*)    All other components within normal limits  URINE MICROSCOPIC-ADD ON - Abnormal; Notable for the following:    Squamous Epithelial / LPF 0-5 (*)    Bacteria, UA RARE (*)    All other components within normal limits    EKG  EKG Interpretation None       Radiology  Ct Renal Stone Study  Result Date: 09/07/2016 CLINICAL  DATA:  Left side back pain beginning last night, left flank pain. EXAM: CT ABDOMEN AND PELVIS WITHOUT CONTRAST TECHNIQUE: Multidetector CT imaging of the abdomen and pelvis was performed following the standard protocol without IV contrast. COMPARISON:  07/28/2013 FINDINGS: Lower chest: Lung bases are clear. No effusions. Heart is normal size. Hepatobiliary: No focal hepatic abnormality. Gallbladder unremarkable. Pancreas: No focal abnormality or ductal dilatation. Spleen: No focal abnormality.  Normal size. Adrenals/Urinary Tract: 8 mm right renal pelvic stone without hydronephrosis. Other smaller punctate nonobstructing right renal stones. There is a punctate 1-2 mm proximal left ureteral stone with mild fullness of the left renal collecting system. Adrenal glands and urinary bladder unremarkable. Stomach/Bowel: Stomach, large and small bowel grossly unremarkable. Vascular/Lymphatic: No evidence of aneurysm or adenopathy. Reproductive: Uterus and adnexa unremarkable.  No mass. Other: No free fluid or free air. Musculoskeletal: Prior left hip replacement. Degenerative disc and facet disease in the lower lumbar spine. Slight anterolisthesis of L4 on L5 related to facet disease. IMPRESSION: 8 mm right renal pelvic stone without hydronephrosis. 1-2 mm proximal left ureteral stone with mild fullness of the left renal collecting system. Right nephrolithiasis. Electronically Signed   By: Rolm Baptise M.D.   On: 09/07/2016 10:07    Procedures Procedures (including critical care time)  Medications Ordered in ED Medications  HYDROmorphone (DILAUDID) injection 1 mg (1 mg Intravenous Given 09/07/16 0856)  ondansetron (ZOFRAN) injection 4 mg (4 mg Intravenous Given 09/07/16 0856)  HYDROmorphone (DILAUDID) injection 1 mg (1 mg Intravenous Given 09/07/16 1020)  ketorolac (TORADOL) 30 MG/ML injection 30 mg (30 mg Intravenous Given 09/07/16 1132)     Initial Impression / Assessment and Plan / ED Course  I have  reviewed the triage vital signs and the nursing notes.  Pertinent labs & imaging results that were available during my care of the patient were reviewed by me and considered in my medical decision making (see chart for details).  Clinical Course   55 y.o. F here with left flank pain.  Hx of same with prior stones.  On exam, patient actively vomiting and appears uncomfortable.  Labs reassuring.  Slightly leukocytosis which may be reactive from vomiting.    UA with large blood.  CT renal study with 1-72mm stone in left ureter.  Larger stone on right, however still within renal pelvis.  Patient's pain has been controlled here. She is drinking ginger ale without difficulty. She reports significant response to Toradol.  She takes home oxycodone, requests Toradol be added to this which I feel is reasonable. Will also start on Flomax and Zofran. Encouraged oral hydration. Urology follow-up given.  Discussed plan with patient, she acknowledged understanding and agreed with plan of care.  Return precautions given for new or worsening symptoms.  Final Clinical Impressions(s) / ED Diagnoses   Final diagnoses:  Left ureteral stone  Flank pain  Hematuria, unspecified type    New Prescriptions Discharge Medication List as of 09/07/2016 12:29 PM    START taking these medications   Details  ketorolac (TORADOL) 10 MG tablet Take 1 tablet (10 mg total) by mouth every 6 (six) hours as needed., Starting Mon 09/07/2016, Print    ondansetron (ZOFRAN ODT) 4 MG disintegrating tablet Take 1 tablet (4 mg total) by mouth every 8 (eight) hours as needed for nausea., Starting Mon 09/07/2016, Print    tamsulosin (FLOMAX) 0.4 MG CAPS capsule Take 1 capsule (0.4 mg total) by mouth 2 (two)  times daily., Starting Mon 09/07/2016, Print         Larene Pickett, PA-C 09/07/16 [place], MD 09/09/16 [phone]

## 2016-09-07 NOTE — Discharge Instructions (Signed)
Take the prescribed medication as directed.  Continue your home pain meds as well. Try to push free water intake at home.  Avoid soda, coffee, tea. Follow-up with urology if any continued issues. Return to the ED for new or worsening symptoms-- severe pain, nausea, vomiting, high fever, etc.

## 2016-09-07 NOTE — ED Notes (Signed)
Pt transported to CT without distress

## 2016-09-07 NOTE — ED Notes (Signed)
Patient undressed, in gown, on continuous pulse oximetry and blood pressure cuff; warm blankets given

## 2016-09-07 NOTE — ED Triage Notes (Addendum)
Pt reports she was woken by a sharp pain in her left side this morning. Hx of kidney stones. Pt reports an episode of emesis. Pt also reports hx of MS. Pt denies dysuria due to she has not urinated since she woke up.

## 2016-09-07 NOTE — ED Notes (Signed)
Assisted patient up to the bedside commode.

## 2016-09-09 ENCOUNTER — Ambulatory Visit (INDEPENDENT_AMBULATORY_CARE_PROVIDER_SITE_OTHER): Payer: PPO | Admitting: Primary Care

## 2016-09-09 ENCOUNTER — Encounter: Payer: Self-pay | Admitting: Primary Care

## 2016-09-09 VITALS — BP 120/82 | HR 107 | Temp 98.9°F | Ht 69.0 in | Wt 208.8 lb

## 2016-09-09 DIAGNOSIS — M1990 Unspecified osteoarthritis, unspecified site: Secondary | ICD-10-CM

## 2016-09-09 DIAGNOSIS — N200 Calculus of kidney: Secondary | ICD-10-CM | POA: Diagnosis not present

## 2016-09-09 MED ORDER — KETOROLAC TROMETHAMINE 30 MG/ML IJ SOLN
30.0000 mg | Freq: Once | INTRAMUSCULAR | Status: AC
Start: 2016-09-09 — End: 2016-09-09
  Administered 2016-09-09: 30 mg via INTRAMUSCULAR

## 2016-09-09 MED ORDER — HYDROCODONE-ACETAMINOPHEN 10-325 MG PO TABS: 1.0000 | ORAL_TABLET | Freq: Four times a day (QID) | ORAL | 0 refills | Status: DC | PRN

## 2016-09-09 NOTE — Progress Notes (Signed)
Subjective:    Patient ID: Emily Moon, female    DOB: [birth year], 55 y.o.   MRN: [id number]  HPI  Emily Moon is a 55 year old female who presents today for emergency department follow up.   She presented to [facility] on 09/07/16 with a chief complaint of left flank pain. Her ain began on 09/06/16 and was sudden. She has a history of kidney stones with prior stenting. During her stay in the Emergency Department she underwent lab testing including UA (large blood), CBC with slight leukocytosis, BMP (stable). She also underwent CT renal study (1-2 mm stone to the left ureter and 8 mm stone in the right pelvis without hydronephrosis).   She was treated with IV Toradol for which significantly improved her pain. She was sent home with a prescription oral Toradol, Zofran, and Flomax. She was advised to follow up Urology and PCP. She was discharged home on 10/23 as she had improved and was tolerating PO intake.  Since her discharge she's continued to experience bilateral flank pain. Overall she's feeling much improved. She has been experiencing bilateral rib pain for which she believes may be secondary to her muscles while straining while in pain in the emergency department. She can still feel her current kidney stones, even the stone on her right side. The oral Toradol has not been as effective as the IV version. The flomax has helped to facilitate the passage of urine. She thinks she may have seen some sediment in the toilet yesterday.  She denies fevers, chills, abdominal pain, feeling worse.   Review of Systems  Constitutional: Negative for chills and fever.  Genitourinary: Positive for flank pain, frequency and pelvic pain. Negative for difficulty urinating, dysuria and hematuria.       Past Medical History:  Diagnosis Date  . Arthritis    osteo  . Bipolar disorder ([place]) 05/21/14  . Cataracts, bilateral   . DDD (degenerative disc disease), cervical    also back  . Depression   .  Dizziness    Positional  . Edema    feet/legs  . Fibromyalgia syndrome   . Fungal infection    Finger nails  . GERD (gastroesophageal reflux disease)   . Headache    seasonal allergies  . Heart palpitations   . 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 Hypercholesterolemia   . Multiple sclerosis (H[facility])    weakness  . Nephrolithiasis    kidney stones  . Osteoporosis    osteoarthritis  . Pneumonia   . PONV (postoperative nausea and vomiting)    no problem after cataract surgery  . Psoriasis   . Renal stone   . Shortness of breath dyspnea    wheezing  . Sleep apnea 2012   sleep study / slight, no interventions  . Urinary frequency      Social History   Social History  . Marital status: Divorced    Spouse name: N/A  . Number of children: 1  . Years of education: N/A   Occupational History  . Customer Service Rep at [employer]  . Smoking status: Former Smoker    Packs/day: 0.50    Years: 25.00    Types: Cigarettes    Quit date: 11/16/2012  . Smokeless tobacco: Never Used     Comment: recenty 04/26/2014  . Alcohol use No  . Drug use: No  . Sexual activity: No   Other Topics Concern  . Not on file  Social History Narrative  . No narrative on file    Past Surgical History:  Procedure Laterality Date  . CATARACT EXTRACTION W/PHACO Left 05/21/2015   Procedure: CATARACT EXTRACTION PHACO AND INTRAOCULAR LENS PLACEMENT (IOC);  Surgeon: Birder Robson, MD;  Location: [facility];  Service: Ophthalmology;  Laterality: Left;  [place] 00:35 AP% 22.9 CDE 8.11 fluid pack lot [id number]  . CATARACT EXTRACTION W/PHACO Right 06/04/2015   Procedure: CATARACT EXTRACTION PHACO AND INTRAOCULAR LENS PLACEMENT (IOC);  Surgeon: Birder Robson, MD;  Location: [facility] OR[facility];  Service: Ophthalmology;  Laterality: Right;  US:00:48 AP%: 10.5 CDE:5.08 Fluid lot [id number]  . EYE SURGERY  2015   tissue biopsy  . FOOT SURGERY  2015  . JOINT REPLACEMENT Left 2013    hip replacement  . LITHOTRIPSY    . PTOSIS REPAIR Bilateral 02/18/2016   Procedure: BILATERAL PTOSIS REPAIR UPPER EYELIDS;  Surgeon: Karle Starch, MD;  Location: [facility];  Service: Ophthalmology;  Laterality: Bilateral;  LEAVE PT EARLY AM  . thumb surgery Right   . TONSILLECTOMY  1973    Family History  Problem Relation Age of Onset  . Cancer Father     Allergies  Allergen Reactions  . Albuterol Shortness Of Breath and Other (See Comments)    Makes pt feel jittery/ tacycardic  . Halcion [Triazolam] Other (See Comments)    Dizziness,headaches,bladder problems  . Levaquin [Levofloxacin In D5w] Diarrhea and Itching    Shoulder pain  . Naproxen Sodium Swelling  . Tylenol [Acetaminophen] Swelling  . Ultram [Tramadol Hcl] Itching and Nausea And Vomiting    dizzyness  . Aripiprazole Other (See Comments)    Pt not aware  . Cefaclor     Doesn't remember  . Diclofenac Sodium     Doesn't remember  . Sulfa Antibiotics     Doesn't remember  . Voltaren [Diclofenac]   . Ibuprofen Swelling    Can take small amounts    Current Outpatient Prescriptions on File Prior to Visit  Medication Sig Dispense Refill  . ASPERCREME LIDOCAINE EX Apply topically.    Marland Kitchen aspirin 325 MG tablet Take 325 mg by mouth as needed for headache.     Marland Kitchen BIOTIN PO Take 5,000 mg by mouth daily.     . cetirizine (ZYRTEC) 10 MG tablet Take 10 mg by mouth daily. pm    . Cholecalciferol (VITAMIN D-3 PO) Take 10,000 Units by mouth daily.     . ciclopirox (PENLAC) 8 % solution Apply nightly over nail and surrounding skin. Apply daily over prior coat. After 7 days, may remove with alcohol and continue 6.6 mL 3  . clobetasol cream (TEMOVATE) [id number] % Apply 1 application topically 2 (two) times daily. 30 g 2  . diclofenac sodium (VOLTAREN) 1 % GEL Apply 4 g topically 4 (four) times daily. (Patient taking differently: Apply 4 g topically as needed. ) 500 g 5  . DULoxetine (CYMBALTA) 20 MG capsule Take 40 mg by  mouth daily. pm  4  . fluticasone (FLONASE) 50 MCG/ACT nasal spray Place 1 spray into both nostrils daily.    . hydroquinone 4 % cream Reported on 02/18/2016  3  . hydrOXYzine (VISTARIL) 50 MG capsule Take 100-150 mg by mouth 3 (three) times daily as needed.     Marland Kitchen ketorolac (TORADOL) 10 MG tablet Take 1 tablet (10 mg total) by mouth every 6 (six) hours as needed. 20 tablet 0  . lamoTRIgine (LAMICTAL) 150 MG tablet Take two tablets by mouth daily (Patient  taking differently: Take two tablets by mouth daily/ pm) 180 tablet 3  . levalbuterol (XOPENEX HFA) 45 MCG/ACT inhaler Inhale 1-2 puffs into the lungs every 6 (six) hours as needed for wheezing. 1 Inhaler 5  . Lysine 1000 MG TABS Take 2,000 mg by mouth daily.     . nicotine (NICODERM CQ - DOSED IN MG/24 HOURS) 21 mg/24hr patch Place 1 patch (21 mg total) onto the skin daily. 28 patch 3  . Omega-3 Fatty Acids (FISH OIL PO) Take 4 capsules by mouth at bedtime.     Marland Kitchen omeprazole (PRILOSEC) 20 MG capsule Take 20 mg by mouth as needed.    . ondansetron (ZOFRAN ODT) 4 MG disintegrating tablet Take 1 tablet (4 mg total) by mouth every 8 (eight) hours as needed for nausea. 12 tablet 0  . Phenazopyridine HCl (AZO TABS PO) Take 2 tablets by mouth as needed.     . polyethylene glycol powder (GLYCOLAX/MIRALAX) powder MIX 17 GRAMS (1 CAPFUL) WITH 4-8 OZ OF LIQUID AND TAKE BY MOUTH TWICE DAILY AS NEEDED 527 g 0  . ranitidine (ZANTAC) 150 MG tablet Take 150 mg by mouth 2 (two) times daily as needed for heartburn. Reported on 02/12/2016    . tamsulosin (FLOMAX) 0.4 MG CAPS capsule Take 1 capsule (0.4 mg total) by mouth 2 (two) times daily. 14 capsule 0  . temazepam (RESTORIL) 30 MG capsule TAKE ONE TO TWO CAPSULES BY MOUTH NIGHTLY AT BEDTIME AS NEEDED  60 capsule 0  . tiZANidine (ZANAFLEX) 4 MG tablet TAKE 2 TABLETS (8 MG TOTAL) BY MOUTH 3 (THREE) TIMES DAILY. 180 tablet 5  . zolpidem (AMBIEN) 10 MG tablet Take 10 mg by mouth at bedtime as needed for sleep.      Current Facility-Administered Medications on File Prior to Visit  Medication Dose Route Frequency Provider Last Rate Last Dose  . cyanocobalamin ((VITAMIN B-12)) injection 1,000 mcg  1,000 mcg Intramuscular Q30 days [name], NP   1,000 mcg at 08/20/16 1500    BP 120/82   Pulse (!) 107   Temp 98.9 F (37.2 C) (Oral)   Ht 5\' 9"  (1.753 m)   Wt 208 lb 12.8 oz (94.7 kg)   LMP 08/23/2014   SpO2 97%   BMI 30.83 kg/m    Objective:   Physical Exam  Constitutional: She appears well-nourished. She does not have a sickly appearance. She does not appear ill.  Cardiovascular: Normal rate and regular rhythm.   Pulmonary/Chest: Effort normal and breath sounds normal.  Abdominal: Soft. Normal appearance and bowel sounds are normal. There is tenderness in the suprapubic area. There is CVA tenderness.  Bilateral CVA tenderness. Bilateral groin discomfort upon palpation.  Skin: Skin is warm and dry.          Assessment & Plan:  Emergency Department follow up:  Presented to [facility] on 09/07/16 with complaints of flank pain. CT renal revealed 8 mm renal stone without hydronephrosis to right, left ureter 1-2 mm stone. Overall did well in the emergency department with IV pain medication and fluids. Since her discharge home she's had no difficulty urinating, although has continued to experience bilateral flank and groin pain. Exam today with CVA tenderness and groin discomfort. Does not appear acutely ill or toxic. Strongly recommended she follow up with urology given her current 8 mm stone and history of complications in the past. Referral placed to urology. IM Toradol provided today. Continue Flomax. Strict return/ED precautions provided.  All Hospital labs, imaging, notes  reviewed.  Sheral Flow, NP

## 2016-09-09 NOTE — Patient Instructions (Signed)
Stop by the front desk and speak with either Rosaria Ferries or Ebony Hail regarding your referral to Urology.  Complete lab work prior to leaving today. I will notify you of your results once received.   Go to the hospital if you develop fevers, vomiting, increased pain.  Continue Flomax and hydration with water.   It was a pleasure to see you today!

## 2016-09-09 NOTE — Progress Notes (Signed)
Pre visit review using our clinic review tool, if applicable. No additional management support is needed unless otherwise documented below in the visit note. 

## 2016-09-10 ENCOUNTER — Ambulatory Visit (INDEPENDENT_AMBULATORY_CARE_PROVIDER_SITE_OTHER): Payer: PPO | Admitting: Urology

## 2016-09-10 ENCOUNTER — Encounter: Payer: Self-pay | Admitting: Urology

## 2016-09-10 VITALS — BP 137/72 | HR 163 | Ht 69.0 in | Wt 208.1 lb

## 2016-09-10 DIAGNOSIS — N200 Calculus of kidney: Secondary | ICD-10-CM

## 2016-09-10 DIAGNOSIS — R3129 Other microscopic hematuria: Secondary | ICD-10-CM | POA: Diagnosis not present

## 2016-09-10 DIAGNOSIS — N201 Calculus of ureter: Secondary | ICD-10-CM | POA: Diagnosis not present

## 2016-09-10 DIAGNOSIS — N132 Hydronephrosis with renal and ureteral calculous obstruction: Secondary | ICD-10-CM | POA: Diagnosis not present

## 2016-09-10 LAB — CBC WITH DIFFERENTIAL/PLATELET
BASOS ABS: 0 10*3/uL (ref 0.0–0.1)
Basophils Relative: 0.6 % (ref 0.0–3.0)
EOS ABS: 0.2 10*3/uL (ref 0.0–0.7)
Eosinophils Relative: 2.7 % (ref 0.0–5.0)
HEMATOCRIT: 38.1 % (ref 36.0–46.0)
HEMOGLOBIN: 12.8 g/dL (ref 12.0–15.0)
LYMPHS PCT: 27.4 % (ref 12.0–46.0)
Lymphs Abs: 2.1 10*3/uL (ref 0.7–4.0)
MCHC: 33.5 g/dL (ref 30.0–36.0)
MCV: 94.5 fl (ref 78.0–100.0)
MONOS PCT: 4.1 % (ref 3.0–12.0)
Monocytes Absolute: 0.3 10*3/uL (ref 0.1–1.0)
Neutro Abs: 5.1 10*3/uL (ref 1.4–7.7)
Neutrophils Relative %: 65.2 % (ref 43.0–77.0)
PLATELETS: 340 10*3/uL (ref 150.0–400.0)
RBC: 4.03 Mil/uL (ref 3.87–5.11)
RDW: 14.1 % (ref 11.5–15.5)
WBC: 7.8 10*3/uL (ref 4.0–10.5)

## 2016-09-10 LAB — URINALYSIS, COMPLETE
Bilirubin, UA: NEGATIVE
Glucose, UA: NEGATIVE
KETONES UA: NEGATIVE
NITRITE UA: NEGATIVE
PH UA: 5.5 (ref 5.0–7.5)
Urobilinogen, Ur: 0.2 mg/dL (ref 0.2–1.0)

## 2016-09-10 LAB — BASIC METABOLIC PANEL
BUN: 12 mg/dL (ref 6–23)
CHLORIDE: 102 meq/L (ref 96–112)
CO2: 25 meq/L (ref 19–32)
CREATININE: 0.94 mg/dL (ref 0.40–1.20)
Calcium: 9.7 mg/dL (ref 8.4–10.5)
GFR: 65.85 mL/min (ref >60.00)
Glucose, Bld: 86 mg/dL (ref 70–99)
POTASSIUM: 4.1 meq/L (ref 3.5–5.1)
Sodium: 139 mEq/L (ref 135–145)

## 2016-09-10 LAB — MICROSCOPIC EXAMINATION: WBC, UA: >30 /hpf — AB (ref 0–?)

## 2016-09-10 MED ORDER — KETOROLAC TROMETHAMINE 60 MG/2ML IM SOLN
60.0000 mg | Freq: Once | INTRAMUSCULAR | Status: AC
  Administered 2016-09-10: 60 mg via INTRAMUSCULAR

## 2016-09-10 MED ORDER — TAMSULOSIN HCL 0.4 MG PO CAPS: 0.4000 mg | ORAL_CAPSULE | Freq: Every day | ORAL | 0 refills | Status: DC

## 2016-09-10 NOTE — Progress Notes (Signed)
IM Injection  Patient is present today for an IM Injection for treatment of pain from kidney stone. Drug: Toradol Dose:60 mg/ 49ml Location:Left Glut Lot: [id number] Exp:03/16/2017 Patient tolerated well, no complications were noted  Preformed by: Lyndee Hensen CMA  Additional notes/ Follow up: After [place]  And patient states she is not allergic to tramadol in small amounts. Per Zara Council give Toradol 60 mg.

## 2016-09-10 NOTE — Progress Notes (Signed)
09/10/2016 4:08 PM   Emily Moon [id number] [id number]  Referring provider: Doreene Nest, NP [address] [place], [place] [address]  Chief Complaint  Patient presents with  . New Patient (Initial Visit)    kidney stones referred by Emily Moon and ER    HPI: Patient is a 55 year old Caucasian who presents/is referred by Emily Rieger, NP and [facility] ED for nephrolithiasis.  Patient states the onset of the pain was five days ago.   It was sharp.  It lasted for several hours.  The pain was located left flank and radiated to left waist.  The pain was a 10/10.  Nothing made the pain better.   Nothing made the pain worse.  She did have nausea and vomiting.  She denied fevers and chills.    In the ED, she received Toradol and Dilaudid.  Her UA contained TNTC RBC's/hpf.  Serum creatinine 1.12 in the ED, it was 0.77 five months ago.   She was discharged with tamsulosin, Zofran and Toradol.    CT Renal stone study performed on 09/07/2016 noted 8 mm right renal pelvic stone without hydronephrosis. 1-2 mm proximal left ureteral stone with mild fullness of the left renal collecting system.  Right nephrolithiasis.  I have independently reviewed the films.    Today, she is experiencing right flank and left waist pain.  She feels that she may have passed her left stone as she has seen sediment in the toilet.  She has not had fevers, chills, nausea or vomiting.    She has significant voiding symptoms; frequency, urgency, nocturia, incontinence, straining to urinate, hesitancy and a weak stream.  UA today noted > 30 WBC's and > 30 RBC's.    She does have a prior history of stones.        PMH: Past Medical History:  Diagnosis Date  . Arthritis    osteo  . Asthma   . Bipolar disorder (HCC) 05/21/14  . Cataracts, bilateral   . DDD (degenerative disc disease), cervical    also back  . Depression   . Dizziness    Positional  . Edema    feet/legs  . Fibromyalgia syndrome    . Fungal infection    Finger nails  . GERD (gastroesophageal reflux disease)   . Gout   . Headache    seasonal allergies  . Heart palpitations   . 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 Hypercholesterolemia   . Multiple sclerosis (HCC)    weakness  . Nephrolithiasis    kidney stones  . Osteoporosis    osteoarthritis  . Pneumonia   . PONV (postoperative nausea and vomiting)    no problem after cataract surgery  . Psoriasis   . Renal stone   . Shortness of breath dyspnea    wheezing  . Sleep apnea 2012   sleep study / slight, no interventions  . Urinary frequency     Surgical History: Past Surgical History:  Procedure Laterality Date  . CATARACT EXTRACTION W/PHACO Left 05/21/2015   Procedure: CATARACT EXTRACTION PHACO AND INTRAOCULAR LENS PLACEMENT (IOC);  Surgeon: Galen Manila, MD;  Location: [facility] ORS;  Service: Ophthalmology;  Laterality: Left;  [place] 00:35 AP% 22.9 CDE 8.11 fluid pack lot #[id number]  . CATARACT EXTRACTION W/PHACO Right 06/04/2015   Procedure: CATARACT EXTRACTION PHACO AND INTRAOCULAR LENS PLACEMENT (IOC);  Surgeon: Galen Manila, MD;  Location: [facility] ORS;  Service: Ophthalmology;  Laterality: Right;  US:00:48 AP%: 10.5 CDE:5.08 Fluid lot #  [id number]  . EYE SURGERY  2015   tissue biopsy  . FOOT SURGERY  2015  . JOINT REPLACEMENT Left 2013   hip replacement  . LITHOTRIPSY    . PTOSIS REPAIR Bilateral 02/18/2016   Procedure: BILATERAL PTOSIS REPAIR UPPER EYELIDS;  Surgeon: Karle Starch, MD;  Location: [facility];  Service: Ophthalmology;  Laterality: Bilateral;  LEAVE PT EARLY AM  . thumb surgery Right   . TONSILLECTOMY  1973    Home Medications:    Medication List       Accurate as of 09/10/16 11:59 PM. Always use your most recent med list.          ASPERCREME LIDOCAINE EX Apply topically.   aspirin 325 MG tablet Take 325 mg by mouth as needed for headache.   AZO TABS PO Take 2 tablets by mouth as needed.   BIOTIN PO Take  5,000 mg by mouth daily.   calcipotriene-betamethasone ointment Commonly known as:  TACLONEX Apply topically.   CALCIUM CARBONATE PO Take by mouth.   ciclopirox 8 % solution Commonly known as:  PENLAC Apply nightly over nail and surrounding skin. Apply daily over prior coat. After 7 days, may remove with alcohol and continue   clobetasol cream 0.05 % Commonly known as:  TEMOVATE Apply 1 application topically 2 (two) times daily.   diclofenac sodium 1 % Gel Commonly known as:  VOLTAREN Apply 4 g topically 4 (four) times daily.   DOCOSAHEXAENOIC ACID PO Take by mouth.   DULoxetine 20 MG capsule Commonly known as:  CYMBALTA Take 40 mg by mouth daily. pm   FISH OIL PO Take 4 capsules by mouth at bedtime.   fluticasone 50 MCG/ACT nasal spray Commonly known as:  FLONASE Place 1 spray into both nostrils daily.   HYDROcodone-acetaminophen 10-325 MG tablet Commonly known as:  NORCO Take 1 tablet by mouth every 6 (six) hours as needed for severe pain.   hydroquinone 4 % cream Reported on 02/18/2016   hydrOXYzine 50 MG capsule Commonly known as:  VISTARIL Take by mouth.   ketorolac 10 MG tablet Commonly known as:  TORADOL Take 1 tablet (10 mg total) by mouth every 6 (six) hours as needed.   lamoTRIgine 150 MG tablet Commonly known as:  LAMICTAL Take two tablets by mouth daily   levalbuterol 45 MCG/ACT inhaler Commonly known as:  XOPENEX HFA Inhale 1-2 puffs into the lungs every 6 (six) hours as needed for wheezing.   Lysine 1000 MG Tabs Take 2,000 mg by mouth daily.   MYRBETRIQ 25 MG Tb24 tablet Generic drug:  mirabegron ER TAKE ONE TABLET BY MOUTH ONE TIME DAILY   nicotine 21 mg/24hr patch Commonly known as:  NICODERM CQ - dosed in mg/24 hours Place 1 patch (21 mg total) onto the skin daily.   omeprazole 20 MG capsule Commonly known as:  PRILOSEC Take 20 mg by mouth as needed.   ondansetron 4 MG disintegrating tablet Commonly known as:  ZOFRAN ODT Take 1  tablet (4 mg total) by mouth every 8 (eight) hours as needed for nausea.   polyethylene glycol powder powder Commonly known as:  GLYCOLAX/MIRALAX MIX 17 GRAMS (1 CAPFUL) WITH 4-8 OZ OF LIQUID AND TAKE BY MOUTH TWICE DAILY AS NEEDED   ranitidine 150 MG tablet Commonly known as:  ZANTAC Take 150 mg by mouth 2 (two) times daily as needed for heartburn. Reported on 02/12/2016   tamsulosin 0.4 MG Caps capsule Commonly known as:  FLOMAX Take 1  capsule (0.4 mg total) by mouth 2 (two) times daily.   tamsulosin 0.4 MG Caps capsule Commonly known as:  FLOMAX Take 1 capsule (0.4 mg total) by mouth daily.   temazepam 30 MG capsule Commonly known as:  RESTORIL TAKE ONE TO TWO CAPSULES BY MOUTH NIGHTLY AT BEDTIME AS NEEDED   tiZANidine 4 MG tablet Commonly known as:  ZANAFLEX TAKE 2 TABLETS (8 MG TOTAL) BY MOUTH 3 (THREE) TIMES DAILY.   VITAMIN D-3 PO Take 10,000 Units by mouth daily.   zolpidem 10 MG tablet Commonly known as:  AMBIEN Take 10 mg by mouth at bedtime as needed for sleep.       Allergies:  Allergies  Allergen Reactions  . Albuterol Shortness Of Breath and Other (See Comments)    Makes pt feel jittery/ tacycardic  . Halcion [Triazolam] Other (See Comments)    Dizziness,headaches,bladder problems  . Levaquin [Levofloxacin In D5w] Diarrhea and Itching    Shoulder pain  . Naproxen Sodium Swelling  . Tylenol [Acetaminophen] Swelling  . Ultram [Tramadol Hcl] Itching and Nausea And Vomiting    dizzyness  . Aripiprazole Other (See Comments)    Pt not aware  . Cefaclor     Doesn't remember  . Diclofenac Sodium     Doesn't remember  . Levofloxacin Diarrhea and Itching  . Naproxen Swelling  . Sulfa Antibiotics     Doesn't remember  . Sulfamethoxazole Other (See Comments)    Pt cannot recall  . Tramadol Itching and Nausea And Vomiting  . Voltaren [Diclofenac]   . Ibuprofen Swelling    Can take small amounts    Family History: Family History  Problem Relation  Age of Onset  . Cancer Father     Abdomen with mastasis  . Cancer Mother   . Heart disease Mother   . Kidney disease Neg Hx   . Bladder Cancer Neg Hx   . Prostate cancer Neg Hx     Social History:  reports that she quit smoking about 3 years ago. Her smoking use included Cigarettes. She has a 12.50 pack-year smoking history. She has never used smokeless tobacco. She reports that she does not drink alcohol or use drugs.  ROS: UROLOGY Frequent Urination?: Yes Hard to postpone urination?: Yes Burning/pain with urination?: No Get up at night to urinate?: Yes Leakage of urine?: Yes Urine stream starts and stops?: No Trouble starting stream?: Yes Do you have to strain to urinate?: Yes Blood in urine?: Yes Urinary tract infection?: Yes Sexually transmitted disease?: No Injury to kidneys or bladder?: No Painful intercourse?: No Weak stream?: Yes Currently pregnant?: No Vaginal bleeding?: No Last menstrual period?: n  Gastrointestinal Nausea?: Yes Vomiting?: Yes Indigestion/heartburn?: Yes Diarrhea?: Yes Constipation?: Yes  Constitutional Fever: Yes Night sweats?: Yes Weight loss?: Yes Fatigue?: Yes  Skin Skin rash/lesions?: No Itching?: No  Eyes Blurred vision?: No Double vision?: No  Ears/Nose/Throat Sore throat?: No Sinus problems?: Yes  Hematologic/Lymphatic Swollen glands?: No Easy bruising?: No  Cardiovascular Leg swelling?: No Chest pain?: No  Respiratory Cough?: No Shortness of breath?: Yes  Endocrine Excessive thirst?: No  Musculoskeletal Back pain?: Yes Joint pain?: Yes  Neurological Headaches?: No Dizziness?: Yes  Psychologic Depression?: Yes Anxiety?: Yes  Physical Exam: BP 137/72   Pulse (!) 163   Ht '5\' 9"'$  (1.753 m)   Wt 208 lb 1.6 oz (94.4 kg)   LMP 08/23/2014   BMI 30.73 kg/m   Constitutional: Well nourished. Alert and oriented, No acute distress. HEENT: [place]  AT, moist mucus membranes. Trachea midline, no  masses. Cardiovascular: No clubbing, cyanosis, or edema. Respiratory: Normal respiratory effort, no increased work of breathing. GI: Abdomen is soft, non tender, non distended, no abdominal masses. Liver and spleen not palpable.  No hernias appreciated.  Stool sample for occult testing is not indicated.   GU: No CVA tenderness.  No bladder fullness or masses.   Skin: No rashes, bruises or suspicious lesions. Lymph: No cervical or inguinal adenopathy. Neurologic: Grossly intact, no focal deficits, moving all 4 extremities. Psychiatric: Normal mood and affect.  Laboratory Data: Lab Results  Component Value Date   WBC 7.8 09/09/2016   HGB 12.8 09/09/2016   HCT 38.1 09/09/2016   MCV 94.5 09/09/2016   PLT 340.0 09/09/2016    Lab Results  Component Value Date   CREATININE 0.94 09/09/2016    Lab Results  Component Value Date   HGBA1C 6.0 03/24/2016    Lab Results  Component Value Date   TSH 2.03 10/18/2015       Component Value Date/Time   CHOL 327 (H) 03/24/2016 0814   HDL 46.60 03/24/2016 0814   CHOLHDL 7 03/24/2016 0814   VLDL 68.0 (H) 03/24/2016 0814   LDLCALC 223 (H) 11/13/2014 0908    Lab Results  Component Value Date   AST 13 03/24/2016   Lab Results  Component Value Date   ALT 13 03/24/2016    Urinalysis    Component Value Date/Time   COLORURINE YELLOW 09/07/2016 1047   APPEARANCEUR Cloudy (A) 09/10/2016 1120   LABSPEC 1.018 09/07/2016 1047   PHURINE 7.0 09/07/2016 1047   GLUCOSEU Negative 09/10/2016 1120   HGBUR LARGE (A) 09/07/2016 1047   HGBUR moderate 11/13/2010 0945   BILIRUBINUR Negative 09/10/2016 1120   KETONESUR 15 (A) 09/07/2016 1047   PROTEINUR 2+ (A) 09/10/2016 1120   PROTEINUR 30 (A) 09/07/2016 1047   UROBILINOGEN 0.2 04/23/2016 1257   UROBILINOGEN 0.2 11/13/2010 0945   NITRITE Negative 09/10/2016 1120   NITRITE NEGATIVE 09/07/2016 1047   LEUKOCYTESUR 1+ (A) 09/10/2016 1120    Pertinent Imaging: CLINICAL DATA:  Left side back  pain beginning last night, left flank pain.  EXAM: CT ABDOMEN AND PELVIS WITHOUT CONTRAST  TECHNIQUE: Multidetector CT imaging of the abdomen and pelvis was performed following the standard protocol without IV contrast.  COMPARISON:  07/28/2013  FINDINGS: Lower chest: Lung bases are clear. No effusions. Heart is normal size.  Hepatobiliary: No focal hepatic abnormality. Gallbladder unremarkable.  Pancreas: No focal abnormality or ductal dilatation.  Spleen: No focal abnormality.  Normal size.  Adrenals/Urinary Tract: 8 mm right renal pelvic stone without hydronephrosis. Other smaller punctate nonobstructing right renal stones. There is a punctate 1-2 mm proximal left ureteral stone with mild fullness of the left renal collecting system. Adrenal glands and urinary bladder unremarkable.  Stomach/Bowel: Stomach, large and small bowel grossly unremarkable.  Vascular/Lymphatic: No evidence of aneurysm or adenopathy.  Reproductive: Uterus and adnexa unremarkable.  No mass.  Other: No free fluid or free air.  Musculoskeletal: Prior left hip replacement. Degenerative disc and facet disease in the lower lumbar spine. Slight anterolisthesis of L4 on L5 related to facet disease.  IMPRESSION: 8 mm right renal pelvic stone without hydronephrosis. 1-2 mm proximal left ureteral stone with mild fullness of the left renal collecting system.  Right nephrolithiasis.   Electronically Signed   By: Rolm Baptise M.D.   On: 09/07/2016 10:07  Assessment & Plan:    1. Left ureteral stone  -  patient still having left sided pain  - advised patient to continue with MET as the mean passage rate for stones less than 2 mm in size in 7 days.  - she is given a strainer and instructed to continue straining the urine  - tamsulosin is refilled  - Urinalysis, Complete  - CULTURE, URINE COMPREHENSIVE  - Advised to contact our office or seek treatment in the ED if becomes  febrile or pain/ vomiting are difficult control in order to arrange for emergent/urgent intervention  2. Right renal stone  - advised patient that the right stone cannot be addressed, unless emergently, until the left stone has passed and the left kidney has healed  - RUS will ordered  - patient is wanting ESWL for definitive treatment of the right stone  - stone is <1500 HU and stone to skin distance is < 15 cm  - I did explain to the patient that the effectiveness of the treatment should reach 88%.  I did advise her that if the stone would not pass after lithotripsy, she may have to undergo another procedure to rid her of the stone.  The risks of ESWL are explained to the patient, such as: bleeding, infection, adjacent tissue/organ damage, ineffective/incomplete stone fragmentation and [place]strasse.    - I also explained to the patient that she may end up with an ureteral stent if the fragments get caught up in her ureter and cannot pass, she has had stents in the past  - will schedule for ESWL once we have conformation that the left stone has passed.  - patient given an injection of Toradol 60 mg IM in the office today  3. Hydronephrosis  - Patient was found to have left hydronephrosis due to a left ureteral stone.  A RUS will be obtained one month after she has passed the left ureteral stone  4. Microscopic hematuria  - We will continue to monitor the patient's UA after the treatment/passage of the stone to ensure the hematuria has resolved.  If hematuria persists, we will pursue a hematuria workup with CT Urogram and cystoscopy if appropriate.  Return for RTC for RUS report.  These notes generated with voice recognition software. I apologize for typographical errors.  Royden Purl  [facility] [address] [place],  [address] [phone]   Addendum:  Patient's RUS performed 09/11/2016 noted mild bilateral hydronephrosis, the  right UPJ stone and bilateral ureteral jets.   Order is placed for another CT Renal stone study to see if the left ureteral stone is still present, if patient is not wanting to undergo a left retrograde, possible left URS/LL/ureteral stent placement.

## 2016-09-11 ENCOUNTER — Telehealth: Payer: Self-pay | Admitting: Urology

## 2016-09-11 ENCOUNTER — Encounter: Payer: Self-pay | Admitting: Primary Care

## 2016-09-11 ENCOUNTER — Ambulatory Visit
Admission: RE | Admit: 2016-09-11 | Discharge: 2016-09-11 | Disposition: A | Discharge status: Home / Self Care | Payer: PPO | Source: Ambulatory Visit | Attending: Urology | Admitting: Urology

## 2016-09-11 ENCOUNTER — Encounter: Payer: Self-pay | Admitting: Urology

## 2016-09-11 ENCOUNTER — Other Ambulatory Visit: Payer: Self-pay | Admitting: Urology

## 2016-09-11 DIAGNOSIS — N132 Hydronephrosis with renal and ureteral calculous obstruction: Secondary | ICD-10-CM | POA: Diagnosis not present

## 2016-09-11 MED ORDER — TAMSULOSIN HCL 0.4 MG PO CAPS: 0.4000 mg | ORAL_CAPSULE | Freq: Every day | ORAL | 0 refills | Status: DC

## 2016-09-11 NOTE — Telephone Encounter (Signed)
I have spoken to the patient concerning her RUS findings.  I have advised her of the warning signs and to head to the ED if they should happen.

## 2016-09-13 LAB — CULTURE, URINE COMPREHENSIVE

## 2016-09-14 ENCOUNTER — Encounter: Payer: Self-pay | Admitting: Urology

## 2016-09-14 ENCOUNTER — Telehealth: Payer: Self-pay

## 2016-09-14 ENCOUNTER — Encounter: Payer: Self-pay | Admitting: Primary Care

## 2016-09-14 NOTE — Telephone Encounter (Signed)
-----   Message from Nori Riis, PA-C sent at 09/11/2016  3:46 PM EDT ----- Patient is very worried about her RUS results.  I have responded to her My Chart message.  I would suggest that Sharyn Lull work on scheduling another CT scan as she will most likely be calling the office Monday morning to request this be done.

## 2016-09-15 ENCOUNTER — Ambulatory Visit (INDEPENDENT_AMBULATORY_CARE_PROVIDER_SITE_OTHER): Payer: PPO | Admitting: *Deleted

## 2016-09-15 DIAGNOSIS — N200 Calculus of kidney: Secondary | ICD-10-CM

## 2016-09-15 MED ORDER — KETOROLAC TROMETHAMINE 60 MG/2ML IM SOLN
60.0000 mg | Freq: Once | INTRAMUSCULAR | Status: AC
  Administered 2016-09-15: 60 mg via INTRAMUSCULAR

## 2016-09-16 ENCOUNTER — Telehealth: Payer: Self-pay

## 2016-09-16 ENCOUNTER — Encounter: Payer: Self-pay | Admitting: Urology

## 2016-09-16 ENCOUNTER — Ambulatory Visit: Payer: PPO | Admitting: Urology

## 2016-09-16 ENCOUNTER — Other Ambulatory Visit: Payer: Self-pay | Admitting: Radiology

## 2016-09-16 VITALS — BP 128/73 | HR 101 | Temp 99.5°F | Ht 69.0 in | Wt 212.0 lb

## 2016-09-16 DIAGNOSIS — N200 Calculus of kidney: Secondary | ICD-10-CM | POA: Diagnosis not present

## 2016-09-16 DIAGNOSIS — R3129 Other microscopic hematuria: Secondary | ICD-10-CM

## 2016-09-16 DIAGNOSIS — N201 Calculus of ureter: Secondary | ICD-10-CM

## 2016-09-16 DIAGNOSIS — N132 Hydronephrosis with renal and ureteral calculous obstruction: Secondary | ICD-10-CM

## 2016-09-16 DIAGNOSIS — N319 Neuromuscular dysfunction of bladder, unspecified: Secondary | ICD-10-CM | POA: Diagnosis not present

## 2016-09-16 LAB — URINALYSIS, COMPLETE
Bilirubin, UA: NEGATIVE
GLUCOSE, UA: NEGATIVE
KETONES UA: NEGATIVE
Nitrite, UA: NEGATIVE
Protein, UA: NEGATIVE
Urobilinogen, Ur: 0.2 mg/dL (ref 0.2–1.0)
pH, UA: 6 (ref 5.0–7.5)

## 2016-09-16 LAB — MICROSCOPIC EXAMINATION: WBC, UA: >30 /hpf — AB (ref 0–?)

## 2016-09-16 MED ORDER — OXYCODONE HCL 5 MG PO TABS: 5.0000 mg | ORAL_TABLET | ORAL | 0 refills | Status: DC | PRN

## 2016-09-16 NOTE — Progress Notes (Signed)
09/16/2016 9:52 PM   Emily Moon [birth year] [id number]  Referring provider: [name], NP [place], [place] [address]  Chief Complaint  Patient presents with  . Follow-up    RUS RESULTS    HPI: Patient is a 55 year old Caucasian who presents today to discuss her options concerning her 2 mm left proximal ureteral stone and right UPJ stone.    Background history Patient was referred by [name], NP and [facility] ED for nephrolithiasis.  She sought treatment in the ED on 09/07/2016.   In the ED, she received Toradol and Dilaudid.  Her UA contained TNTC RBC's/hpf.  Serum creatinine 1.12 in the ED, it was 0.77 five months ago.   She was discharged with tamsulosin, Zofran and Toradol.   CT Renal stone study performed on 09/07/2016 noted 8 mm right renal pelvic stone without hydronephrosis. 1-2 mm proximal left ureteral stone with mild fullness of the left renal collecting system.  Right nephrolithiasis.  I have independently reviewed the films.    She then had a follow-up renal ultrasound on 09/11/2016 which noted mild bilateral hydronephrosis. Right renal pelvis calculus measures 1.2 cm. Bilateral ureteral jets are noted.  I have independently reviewed the films.  Today, she is experiencing urinary frequency, nocturia, incontinence, straining to urinate, urinary hesitancy and a weak urinary stream. She is also having some nausea. She is having crampy pain in her right lower quadrant and left sided achiness and continues to pass sediment.  Her UA today is significant for greater than 10 WBC's and 3-10 rbc's.    Urine culture from 09/10/2016 contained mixed urogenital flora. A current urine culture from today's UA is pending.  Patient also has a history of MS and had been on Myrbetriq in the past for her bladder symptoms with good results and is requesting a refill for that medication at this time.  PMH: Past Medical History:  Diagnosis Date  . Arthritis      osteo  . Asthma   . Bipolar disorder ([place]) 05/21/14  . Cataracts, bilateral   . DDD (degenerative disc disease), cervical    also back  . Depression   . Dizziness    Positional  . Edema    feet/legs  . Fibromyalgia syndrome   . Fungal infection    Finger nails  . GERD (gastroesophageal reflux disease)   . Gout   . Headache    seasonal allergies  . Heart palpitations   . 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 Hypercholesterolemia   . Multiple sclerosis (HCC)    weakness  . Nephrolithiasis    kidney stones  . Osteoporosis    osteoarthritis  . Pneumonia   . PONV (postoperative nausea and vomiting)    no problem after cataract surgery  . Psoriasis   . Renal stone   . Shortness of breath dyspnea    wheezing  . Sleep apnea 2012   sleep study / slight, no interventions  . Urinary frequency     Surgical History: Past Surgical History:  Procedure Laterality Date  . CATARACT EXTRACTION W/PHACO Left 05/21/2015   Procedure: CATARACT EXTRACTION PHACO AND INTRAOCULAR LENS PLACEMENT (IOC);  Surgeon: Birder Robson, MD;  Location: [facility] ORS;  Service: Ophthalmology;  Laterality: Left;  [place] 00:35 AP% 22.9 CDE 8.11 fluid pack lot #[id number]  . CATARACT EXTRACTION W/PHACO Right 06/04/2015   Procedure: CATARACT EXTRACTION PHACO AND INTRAOCULAR LENS PLACEMENT (IOC);  Surgeon: Birder Robson, MD;  Location: [facility]  ORS;  Service: Ophthalmology;  Laterality: Right;  US:00:48 AP%: 10.5 CDE:5.08 Fluid lot #[id number]  . EYE SURGERY  2015   tissue biopsy  . FOOT SURGERY  2015  . JOINT REPLACEMENT Left 2013   hip replacement  . LITHOTRIPSY    . PTOSIS REPAIR Bilateral 02/18/2016   Procedure: BILATERAL PTOSIS REPAIR UPPER EYELIDS;  Surgeon: Karle Starch, MD;  Location: [place];  Service: Ophthalmology;  Laterality: Bilateral;  LEAVE PT EARLY AM  . thumb surgery Right   . TONSILLECTOMY  1973    Home Medications:    Medication List       Accurate as of 09/16/16  9:52 PM.  Always use your most recent med list.          ASPERCREME LIDOCAINE EX Apply topically.   aspirin 325 MG tablet Take 325 mg by mouth as needed for headache.   AZO TABS PO Take 2 tablets by mouth as needed.   BIOTIN PO Take 5,000 mg by mouth daily.   calcipotriene-betamethasone ointment Commonly known as:  TACLONEX Apply topically.   CALCIUM CARBONATE PO Take by mouth.   ciclopirox 8 % solution Commonly known as:  PENLAC Apply nightly over nail and surrounding skin. Apply daily over prior coat. After 7 days, may remove with alcohol and continue   clobetasol cream 0.05 % Commonly known as:  TEMOVATE Apply 1 application topically 2 (two) times daily.   diclofenac sodium 1 % Gel Commonly known as:  VOLTAREN Apply 4 g topically 4 (four) times daily.   DOCOSAHEXAENOIC ACID PO Take by mouth.   DULoxetine 20 MG capsule Commonly known as:  CYMBALTA Take 40 mg by mouth daily. pm   FISH OIL PO Take 4 capsules by mouth at bedtime.   fluticasone 50 MCG/ACT nasal spray Commonly known as:  FLONASE Place 1 spray into both nostrils daily.   HYDROcodone-acetaminophen 10-325 MG tablet Commonly known as:  NORCO Take 1 tablet by mouth every 6 (six) hours as needed for severe pain.   hydroquinone 4 % cream Reported on 02/18/2016   hydrOXYzine 50 MG capsule Commonly known as:  VISTARIL Take by mouth.   ketorolac 10 MG tablet Commonly known as:  TORADOL Take 1 tablet (10 mg total) by mouth every 6 (six) hours as needed.   lamoTRIgine 150 MG tablet Commonly known as:  LAMICTAL Take two tablets by mouth daily   levalbuterol 45 MCG/ACT inhaler Commonly known as:  XOPENEX HFA Inhale 1-2 puffs into the lungs every 6 (six) hours as needed for wheezing.   Lysine 1000 MG Tabs Take 2,000 mg by mouth daily.   MYRBETRIQ 25 MG Tb24 tablet Generic drug:  mirabegron ER TAKE ONE TABLET BY MOUTH ONE TIME DAILY   nicotine 21 mg/24hr patch Commonly known as:  NICODERM CQ - dosed  in mg/24 hours Place 1 patch (21 mg total) onto the skin daily.   omeprazole 20 MG capsule Commonly known as:  PRILOSEC Take 20 mg by mouth as needed.   ondansetron 4 MG disintegrating tablet Commonly known as:  ZOFRAN ODT Take 1 tablet (4 mg total) by mouth every 8 (eight) hours as needed for nausea.   oxyCODONE 5 MG immediate release tablet Commonly known as:  ROXICODONE Take 1 tablet (5 mg total) by mouth every 4 (four) hours as needed for severe pain.   polyethylene glycol powder powder Commonly known as:  GLYCOLAX/MIRALAX MIX 17 GRAMS (1 CAPFUL) WITH 4-8 OZ OF LIQUID AND TAKE BY MOUTH  TWICE DAILY AS NEEDED   ranitidine 150 MG tablet Commonly known as:  ZANTAC Take 150 mg by mouth 2 (two) times daily as needed for heartburn. Reported on 02/12/2016   tamsulosin 0.4 MG Caps capsule Commonly known as:  FLOMAX Take 1 capsule (0.4 mg total) by mouth 2 (two) times daily.   tamsulosin 0.4 MG Caps capsule Commonly known as:  FLOMAX Take 1 capsule (0.4 mg total) by mouth daily.   tamsulosin 0.4 MG Caps capsule Commonly known as:  FLOMAX Take 1 capsule (0.4 mg total) by mouth daily.   temazepam 30 MG capsule Commonly known as:  RESTORIL TAKE ONE TO TWO CAPSULES BY MOUTH NIGHTLY AT BEDTIME AS NEEDED   tiZANidine 4 MG tablet Commonly known as:  ZANAFLEX TAKE 2 TABLETS (8 MG TOTAL) BY MOUTH 3 (THREE) TIMES DAILY.   VITAMIN D-3 PO Take 10,000 Units by mouth daily.   zolpidem 10 MG tablet Commonly known as:  AMBIEN Take 10 mg by mouth at bedtime as needed for sleep.       Allergies:  Allergies  Allergen Reactions  . Albuterol Shortness Of Breath and Other (See Comments)    Makes pt feel jittery/ tacycardic  . Halcion [Triazolam] Other (See Comments)    Dizziness,headaches,bladder problems  . Levaquin [Levofloxacin In D5w] Diarrhea and Itching    Shoulder pain  . Naproxen Sodium Swelling  . Tylenol [Acetaminophen] Swelling  . Ultram [Tramadol Hcl] Itching and Nausea  And Vomiting    dizzyness  . Aripiprazole Other (See Comments)    Pt not aware  . Cefaclor     Doesn't remember  . Diclofenac Sodium     Doesn't remember  . Levofloxacin Diarrhea and Itching  . Naproxen Swelling  . Sulfa Antibiotics     Doesn't remember  . Sulfamethoxazole Other (See Comments)    Pt cannot recall  . Tramadol Itching and Nausea And Vomiting  . Voltaren [Diclofenac]   . Ibuprofen Swelling    Can take small amounts    Family History: Family History  Problem Relation Age of Onset  . Cancer Father     Abdomen with mastasis  . Cancer Mother   . Heart disease Mother   . Kidney disease Neg Hx   . Bladder Cancer Neg Hx   . Prostate cancer Neg Hx     Social History:  reports that she quit smoking about 3 years ago. Her smoking use included Cigarettes. She has a 12.50 pack-year smoking history. She has never used smokeless tobacco. She reports that she does not drink alcohol or use drugs.  ROS: UROLOGY Frequent Urination?: Yes Hard to postpone urination?: No Burning/pain with urination?: No Get up at night to urinate?: Yes Leakage of urine?: Yes Urine stream starts and stops?: No Trouble starting stream?: Yes Do you have to strain to urinate?: Yes Blood in urine?: No Urinary tract infection?: No Sexually transmitted disease?: No Injury to kidneys or bladder?: No Painful intercourse?: No Weak stream?: Yes Currently pregnant?: No Vaginal bleeding?: No Last menstrual period?: n  Gastrointestinal Nausea?: Yes Vomiting?: No Indigestion/heartburn?: Yes Diarrhea?: Yes Constipation?: Yes  Constitutional Fever: Yes Night sweats?: Yes Weight loss?: No Fatigue?: Yes  Skin Skin rash/lesions?: Yes Itching?: No  Eyes Blurred vision?: No Double vision?: No  Ears/Nose/Throat Sore throat?: No Sinus problems?: Yes  Hematologic/Lymphatic Swollen glands?: No Easy bruising?: No  Cardiovascular Leg swelling?: No Chest pain?:  No  Respiratory Cough?: No Shortness of breath?: Yes  Endocrine Excessive thirst?: No  Musculoskeletal Back pain?: Yes Joint pain?: Yes  Neurological Headaches?: Yes Dizziness?: No  Psychologic Depression?: Yes Anxiety?: Yes  Physical Exam: BP 128/73 (BP Location: Left Arm, Patient Position: Sitting, Cuff Size: Large)   Pulse (!) 101   Temp 99.5 F (37.5 C) (Oral)   Ht _0  (1.753 m)   Wt 212 lb (96.2 kg)   LMP 08/23/2014   BMI 31.31 kg/m   Constitutional: Well nourished. Alert and oriented, No acute distress. HEENT: [place] AT, moist mucus membranes. Trachea midline, no masses. Cardiovascular: No clubbing, cyanosis, or edema. Respiratory: Normal respiratory effort, no increased work of breathing. GI: Abdomen is soft, non tender, non distended, no abdominal masses. Liver and spleen not palpable.  No hernias appreciated.  Stool sample for occult testing is not indicated.   GU: No CVA tenderness.  No bladder fullness or masses.   Skin: No rashes, bruises or suspicious lesions. Lymph: No cervical or inguinal adenopathy. Neurologic: Grossly intact, no focal deficits, moving all 4 extremities. Psychiatric: Normal mood and affect.  Laboratory Data: Lab Results  Component Value Date   WBC 7.8 09/09/2016   HGB 12.8 09/09/2016   HCT 38.1 09/09/2016   MCV 94.5 09/09/2016   PLT 340.0 09/09/2016    Lab Results  Component Value Date   CREATININE 0.94 09/09/2016    Lab Results  Component Value Date   HGBA1C 6.0 03/24/2016    Lab Results  Component Value Date   TSH 2.03 10/18/2015       Component Value Date/Time   CHOL 327 (H) 03/24/2016 0814   HDL 46.60 03/24/2016 0814   CHOLHDL 7 03/24/2016 0814   VLDL 68.0 (H) 03/24/2016 0814   LDLCALC 223 (H) 11/13/2014 0908    Lab Results  Component Value Date   AST 13 03/24/2016   Lab Results  Component Value Date   ALT 13 03/24/2016    Urinalysis Results for orders placed or performed in visit on 09/16/16   Microscopic Examination  Result Value Ref Range   WBC, UA >30 (A) 0 - 5 /hpf   RBC, UA 3-10 (A) 0 - 2 /hpf   Epithelial Cells (non renal) 0-10 0 - 10 /hpf   Bacteria, UA Few None seen/Few  Urinalysis, Complete  Result Value Ref Range   Specific Gravity, UA <1.005 (L) 1.005 - 1.030   pH, UA 6.0 5.0 - 7.5   Color, UA Yellow Yellow   Appearance Ur Clear Clear   Leukocytes, UA 1+ (A) Negative   Protein, UA Negative Negative/Trace   Glucose, UA Negative Negative   Ketones, UA Negative Negative   RBC, UA 2+ (A) Negative   Bilirubin, UA Negative Negative   Urobilinogen, Ur 0.2 0.2 - 1.0 mg/dL   Nitrite, UA Negative Negative   Microscopic Examination See below:     Pertinent Imaging: CLINICAL DATA:  Left side back pain beginning last night, left flank pain.  EXAM: CT ABDOMEN AND PELVIS WITHOUT CONTRAST  TECHNIQUE: Multidetector CT imaging of the abdomen and pelvis was performed following the standard protocol without IV contrast.  COMPARISON:  07/28/2013  FINDINGS: Lower chest: Lung bases are clear. No effusions. Heart is normal size.  Hepatobiliary: No focal hepatic abnormality. Gallbladder unremarkable.  Pancreas: No focal abnormality or ductal dilatation.  Spleen: No focal abnormality.  Normal size.  Adrenals/Urinary Tract: 8 mm right renal pelvic stone without hydronephrosis. Other smaller punctate nonobstructing right renal stones. There is a punctate 1-2 mm proximal left ureteral stone with mild fullness of  the left renal collecting system. Adrenal glands and urinary bladder unremarkable.  Stomach/Bowel: Stomach, large and small bowel grossly unremarkable.  Vascular/Lymphatic: No evidence of aneurysm or adenopathy.  Reproductive: Uterus and adnexa unremarkable.  No mass.  Other: No free fluid or free air.  Musculoskeletal: Prior left hip replacement. Degenerative disc and facet disease in the lower lumbar spine. Slight anterolisthesis of L4  on L5 related to facet disease.  IMPRESSION: 8 mm right renal pelvic stone without hydronephrosis. 1-2 mm proximal left ureteral stone with mild fullness of the left renal collecting system.  Right nephrolithiasis.   Electronically Signed   By: Rolm Baptise M.D.   On: 09/07/2016 10:07  CLINICAL DATA:  Hydronephrosis  EXAM: RENAL / URINARY TRACT ULTRASOUND COMPLETE  COMPARISON:  CT scan 09/07/2016  FINDINGS: Right Kidney:  Length: 11.6 cm. There is normal echogenicity. Mild right hydronephrosis. There is a shadowing calculus in right renal pelvis measures 1.2 cm.  Left Kidney:  Length: 12.5 cm. Normal echogenicity. Mild hydronephrosis. No shadowing calculi.  Bladder:  Appears normal for degree of bladder distention. Bilateral ureteral jets are noted.  IMPRESSION: Mild bilateral hydronephrosis. Right renal pelvis calculus measures 1.2 cm. Bilateral ureteral jets are noted.   Electronically Signed   By: Lahoma Crocker M.D.   On: 09/11/2016 09:48   Assessment & Plan:    Patient will undergo a LEFT ureteroscopy with possible laser lithotripsy and possible ureteral stent placement for a 2 mm stone located in the proximal LEFT ureter with a possible RIGHT ureteroscopy with possible laser lithotripsy with a RIGHT ureteral stent placement for her RIGHT UPJ stone.     1. Left ureteral stone  - patient still having left sided pain, continue with MET, given a strainer and instructed to continue straining the urine, tamsulosin is refilled  - I explained to the patient that since she is still symptomatic on her left side, we will explore her left ureter with the ureteroscope to see if the 2 mm stone is still present, if it is still within her ureter, we will proceed with laser lithotripsy and she will have a ureteral stent left in place  - if her right ureter and kidney are found to be obstructed during the procedure, she will also have a right ureteral stent  placed but the right UPJ stone will not be addressed during this procedure, she will have to return to the operating room for a staged procedure on a different day  - explained to the patient how the procedure is performed and the risks involved  - informed patient that they will have a stent or stents placed during the procedure and will remain in place after the procedure for a short time.   - stent may be removed in the office with a cystoscope or patient may be instructed to remove the stent themselves by the string  - described "stent pain" as feelings of needing to urinate/overactive bladder and a warm, tingling sensation to intense pain in the affected flank  - injury to the ureter is the most common intra-operative risk, it may result in an open procedure to correct the defect  - infection and bleeding are also risks  - explained the risks of general anesthesia, such as: MI, CVA, paralysis, coma and/or death.  - advised to contact our office or seek treatment in the ED if becomes febrile or pain/ vomiting are difficult control in order to arrange for emergent/urgent intervention  - Urinalysis, Complete  - CULTURE,  URINE COMPREHENSIVE  - patient given oxycodone IR 5 mg,  # 10 tablets   2. Right renal stone  - advised patient that the right stone cannot be addressed, unless emergently, until the left stone has passed and the left kidney has healed  - explained to the patient that if the left collecting system is clear of the 2 mm stone, the right stone may be able to be addressed with ureteroscopy with laser lithotripsy and a ureteral stent placed, but it is more likely that her right UPJ  will need to be addressed in a staged procedure and she may have a left and right ureteral stent in place after the procedure   3. Hydronephrosis  - Patient was found to have left hydronephrosis due to a left ureteral stone.  A RUS will be obtained one month after she has passed the left ureteral  stone  4. Microscopic hematuria  - We will continue to monitor the patient's UA after the treatment/passage of the stone to ensure the hematuria has resolved.  If hematuria persists, we will pursue a hematuria workup with CT Urogram and cystoscopy if appropriate.  5. Neurogenic bladder  - Samples of Myrbetriq 25 mg daily, #28 are given to the patient  - We'll address this further once she has undergone definitive treatment for her stones  Return for LEFT URS with possible laser lithotripsy with possible ureteral stent placement with possible RIGHT . URS/LL and right ureteral stent placement  These notes generated with voice recognition software. I apologize for typographical errors.  Zara Council, [facility] [address] [place], [place] [address] [phone]

## 2016-09-16 NOTE — Telephone Encounter (Signed)
Pt left v/m; med was added and she wants Emily Bossier NP to be aware. Added oxycodone 5 mg #10 for breakthru pain.pt having surgery for kidney stones on 09/22/16. Pt is under contract for controlled substances and wanted Anda Kraft to be aware of new med. Pt request cb to assure this is OK with Emily Bossier NP.

## 2016-09-16 NOTE — Telephone Encounter (Signed)
Please notify patient that the oxycodone is okay, especially given her upcoming surgical date.

## 2016-09-17 ENCOUNTER — Telehealth: Payer: Self-pay | Admitting: Radiology

## 2016-09-17 NOTE — Telephone Encounter (Signed)
Notified pt of surgery scheduled with Dr Erlene Quan on 09/22/16, pre-admit testing appt on 09/18/16 @9 :30 & to call day prior to surgery for arrival time to SDS. Pt voices understanding.

## 2016-09-17 NOTE — Telephone Encounter (Signed)
Spoken and notified patient of [name]'s comments. Patient verbalized understanding. 

## 2016-09-18 ENCOUNTER — Telehealth: Payer: Self-pay

## 2016-09-18 ENCOUNTER — Encounter: Payer: Self-pay | Admitting: Urology

## 2016-09-18 ENCOUNTER — Ambulatory Visit (INDEPENDENT_AMBULATORY_CARE_PROVIDER_SITE_OTHER): Payer: PPO | Admitting: Family Medicine

## 2016-09-18 ENCOUNTER — Inpatient Hospital Stay: Admission: RE | Admit: 2016-09-18 | Payer: Self-pay | Source: Ambulatory Visit

## 2016-09-18 DIAGNOSIS — N200 Calculus of kidney: Secondary | ICD-10-CM | POA: Diagnosis not present

## 2016-09-18 LAB — CULTURE, URINE COMPREHENSIVE

## 2016-09-18 MED ORDER — KETOROLAC TROMETHAMINE 60 MG/2ML IM SOLN
30.0000 mg | Freq: Once | INTRAMUSCULAR | Status: AC
  Administered 2016-09-18: 30 mg via INTRAMUSCULAR

## 2016-09-18 MED ORDER — PROMETHAZINE HCL 25 MG/ML IJ SOLN
25.0000 mg | Freq: Once | INTRAMUSCULAR | Status: AC
  Administered 2016-09-18: 25 mg via INTRAMUSCULAR

## 2016-09-18 NOTE — Telephone Encounter (Signed)
Pt left /vm requesting toradol injection due to kidney stone pain. I called pt back and she is presently at [facility] urological to get pain injection for kidney stones; nothing further needed now. FYI to Allie Bossier NP.

## 2016-09-18 NOTE — Progress Notes (Signed)
IM Injection  Patient is present today for an IM Injection for treatment of Kidney stone pain Drug: Toradol Dose:30mg  Location:Left Upper outer Buttock Lot: [id number] Exp:03/16/2017 Patient tolerated well, no complications were noted  Preformed by: Elberta Leatherwood, CMA  IM Injection  Patient is present today for an IM Injection for treatment of Kidney stone pain, nausea and vomiting Drug: Promethazine Dose:25mg  Location:Right upper outer buttock Lot: [id number] Exp:09/2016 Patient tolerated well, no complications were noted  Preformed by: Elberta Leatherwood, CMA

## 2016-09-18 NOTE — Telephone Encounter (Signed)
Noted  

## 2016-09-21 ENCOUNTER — Encounter
Admission: RE | Admit: 2016-09-21 | Discharge: 2016-09-21 | Disposition: A | Discharge status: Home / Self Care | Payer: PPO | Source: Ambulatory Visit | Attending: Urology | Admitting: Urology

## 2016-09-21 NOTE — Patient Instructions (Signed)
Your procedure is scheduled on: Tuesday 09/22/16 Report to Day Surgery. 2ND FLOOR MEDICAL MALL ENTRANCE To find out your arrival time please call [phone] between 1PM - 3PM on Monday 09/21/16 (TODAY).  Remember: Instructions that are not followed completely may result in serious medical risk, up to and including death, or upon the discretion of your surgeon and anesthesiologist your surgery may need to be rescheduled.    __X__ 1. Do not eat food or drink liquids after midnight. No gum chewing or hard candies.     __X__ 2. No Alcohol for 24 hours before or after surgery.   ____ 3. Bring all medications with you on the day of surgery if instructed.    __X__ 4. Notify your doctor if there is any change in your medical condition     (cold, fever, infections).     Do not wear jewelry, make-up, hairpins, clips or nail polish.  Do not wear lotions, powders, or perfumes.   Do not shave 48 hours prior to surgery. Men may shave face and neck.  Do not bring valuables to the hospital.    [facility] is not responsible for any belongings or valuables.               Contacts, dentures or bridgework may not be worn into surgery.  Leave your suitcase in the car. After surgery it may be brought to your room.  For patients admitted to the hospital, discharge time is determined by your                treatment team.   Patients discharged the day of surgery will not be allowed to drive home.   Please read over the following fact sheets that you were given:   Pain Booklet    __X__ Take these medicines the morning of surgery with A SIP OF WATER:    1. OMEPRAZOLE  2. HYDROCODONE IF NEEDED  3.   4.  5.  6.  ____ Fleet Enema (as directed)   ____ Use CHG Soap as directed  __X__ Use inhalers on the day of surgery  ____ Stop metformin 2 days prior to surgery    ____ Take 1/2 of usual insulin dose the night before surgery and none on the morning of surgery.   ____ Stop Coumadin/Plavix/aspirin  on   ____ Stop Anti-inflammatories on    ____ Stop supplements until after surgery.    ____ Bring C-Pap to the hospital.

## 2016-09-22 ENCOUNTER — Ambulatory Visit
Admission: RE | Admit: 2016-09-22 | Discharge: 2016-09-22 | Disposition: A | Discharge status: Home / Self Care | Payer: PPO | Source: Ambulatory Visit | Attending: Urology | Admitting: Urology

## 2016-09-22 ENCOUNTER — Encounter: Payer: Self-pay | Admitting: Anesthesiology

## 2016-09-22 ENCOUNTER — Ambulatory Visit: Payer: PPO | Admitting: Registered Nurse

## 2016-09-22 ENCOUNTER — Encounter
Admission: RE | Disposition: A | Discharge status: Home / Self Care | Payer: Self-pay | Source: Ambulatory Visit | Attending: Urology

## 2016-09-22 DIAGNOSIS — Z881 Allergy status to other antibiotic agents status: Secondary | ICD-10-CM | POA: Insufficient documentation

## 2016-09-22 DIAGNOSIS — K219 Gastro-esophageal reflux disease without esophagitis: Secondary | ICD-10-CM | POA: Insufficient documentation

## 2016-09-22 DIAGNOSIS — G4730 Sleep apnea, unspecified: Secondary | ICD-10-CM | POA: Diagnosis not present

## 2016-09-22 DIAGNOSIS — Z87891 Personal history of nicotine dependence: Secondary | ICD-10-CM | POA: Diagnosis not present

## 2016-09-22 DIAGNOSIS — M810 Age-related osteoporosis without current pathological fracture: Secondary | ICD-10-CM | POA: Diagnosis not present

## 2016-09-22 DIAGNOSIS — G35 Multiple sclerosis: Secondary | ICD-10-CM | POA: Insufficient documentation

## 2016-09-22 DIAGNOSIS — M109 Gout, unspecified: Secondary | ICD-10-CM | POA: Diagnosis not present

## 2016-09-22 DIAGNOSIS — Z886 Allergy status to analgesic agent status: Secondary | ICD-10-CM | POA: Diagnosis not present

## 2016-09-22 DIAGNOSIS — F319 Bipolar disorder, unspecified: Secondary | ICD-10-CM | POA: Diagnosis not present

## 2016-09-22 DIAGNOSIS — Z9841 Cataract extraction status, right eye: Secondary | ICD-10-CM | POA: Diagnosis not present

## 2016-09-22 DIAGNOSIS — Z888 Allergy status to other drugs, medicaments and biological substances status: Secondary | ICD-10-CM | POA: Diagnosis not present

## 2016-09-22 DIAGNOSIS — Z7982 Long term (current) use of aspirin: Secondary | ICD-10-CM | POA: Diagnosis not present

## 2016-09-22 DIAGNOSIS — Z800 Family history of malignant neoplasm of digestive organs: Secondary | ICD-10-CM | POA: Diagnosis not present

## 2016-09-22 DIAGNOSIS — Z885 Allergy status to narcotic agent status: Secondary | ICD-10-CM | POA: Diagnosis not present

## 2016-09-22 DIAGNOSIS — Z882 Allergy status to sulfonamides status: Secondary | ICD-10-CM | POA: Insufficient documentation

## 2016-09-22 DIAGNOSIS — M1990 Unspecified osteoarthritis, unspecified site: Secondary | ICD-10-CM | POA: Insufficient documentation

## 2016-09-22 DIAGNOSIS — N200 Calculus of kidney: Secondary | ICD-10-CM

## 2016-09-22 DIAGNOSIS — M797 Fibromyalgia: Secondary | ICD-10-CM | POA: Diagnosis not present

## 2016-09-22 DIAGNOSIS — Z9842 Cataract extraction status, left eye: Secondary | ICD-10-CM | POA: Insufficient documentation

## 2016-09-22 DIAGNOSIS — J45909 Unspecified asthma, uncomplicated: Secondary | ICD-10-CM | POA: Diagnosis not present

## 2016-09-22 DIAGNOSIS — N132 Hydronephrosis with renal and ureteral calculous obstruction: Secondary | ICD-10-CM | POA: Insufficient documentation

## 2016-09-22 DIAGNOSIS — Z961 Presence of intraocular lens: Secondary | ICD-10-CM | POA: Insufficient documentation

## 2016-09-22 DIAGNOSIS — E7800 Pure hypercholesterolemia, unspecified: Secondary | ICD-10-CM | POA: Diagnosis not present

## 2016-09-22 DIAGNOSIS — Z96642 Presence of left artificial hip joint: Secondary | ICD-10-CM | POA: Insufficient documentation

## 2016-09-22 DIAGNOSIS — N319 Neuromuscular dysfunction of bladder, unspecified: Secondary | ICD-10-CM | POA: Diagnosis not present

## 2016-09-22 HISTORY — PX: CYSTOSCOPY/URETEROSCOPY/HOLMIUM LASER/STENT PLACEMENT: SHX6546

## 2016-09-22 SURGERY — CYSTOSCOPY/URETEROSCOPY/HOLMIUM LASER/STENT PLACEMENT
Anesthesia: General | Site: Ureter | Laterality: Bilateral | Wound class: Clean Contaminated

## 2016-09-22 MED ORDER — FENTANYL CITRATE (PF) 100 MCG/2ML IJ SOLN
INTRAMUSCULAR | Status: AC
  Administered 2016-09-22: 25 ug via INTRAVENOUS
  Filled 2016-09-22: qty 2

## 2016-09-22 MED ORDER — SUGAMMADEX SODIUM 500 MG/5ML IV SOLN
INTRAVENOUS | Status: DC | PRN
  Administered 2016-09-22: 400 mg via INTRAVENOUS

## 2016-09-22 MED ORDER — ROCURONIUM BROMIDE 100 MG/10ML IV SOLN
INTRAVENOUS | Status: DC | PRN
  Administered 2016-09-22: 10 mg via INTRAVENOUS
  Administered 2016-09-22: 30 mg via INTRAVENOUS
  Administered 2016-09-22 (×3): 20 mg via INTRAVENOUS

## 2016-09-22 MED ORDER — PROPOFOL 10 MG/ML IV BOLUS
INTRAVENOUS | Status: DC | PRN
  Administered 2016-09-22: 170 mg via INTRAVENOUS

## 2016-09-22 MED ORDER — ONDANSETRON HCL 4 MG/2ML IJ SOLN: 4.0000 mg | Freq: Once | INTRAMUSCULAR | Status: DC | PRN

## 2016-09-22 MED ORDER — IOTHALAMATE MEGLUMINE 43 % IV SOLN
INTRAVENOUS | Status: DC | PRN
  Administered 2016-09-22: 15 mL

## 2016-09-22 MED ORDER — DEXAMETHASONE SODIUM PHOSPHATE 10 MG/ML IJ SOLN
INTRAMUSCULAR | Status: DC | PRN
  Administered 2016-09-22: 10 mg via INTRAVENOUS

## 2016-09-22 MED ORDER — MIDAZOLAM HCL 2 MG/2ML IJ SOLN
INTRAMUSCULAR | Status: DC | PRN
  Administered 2016-09-22: 2 mg via INTRAVENOUS

## 2016-09-22 MED ORDER — HYDROCODONE-ACETAMINOPHEN 5-325 MG PO TABS
2.0000 | ORAL_TABLET | Freq: Four times a day (QID) | ORAL | Status: DC | PRN
  Administered 2016-09-22: 2 via ORAL

## 2016-09-22 MED ORDER — OXYBUTYNIN CHLORIDE 5 MG PO TABS: 5.0000 mg | ORAL_TABLET | Freq: Three times a day (TID) | ORAL | 0 refills | Status: DC | PRN

## 2016-09-22 MED ORDER — FENTANYL CITRATE (PF) 100 MCG/2ML IJ SOLN
INTRAMUSCULAR | Status: DC | PRN
  Administered 2016-09-22: 50 ug via INTRAVENOUS
  Administered 2016-09-22: 100 ug via INTRAVENOUS
  Administered 2016-09-22: 50 ug via INTRAVENOUS

## 2016-09-22 MED ORDER — LIDOCAINE HCL (PF) 1 % IJ SOLN
INTRAMUSCULAR | Status: AC
  Filled 2016-09-22: qty 2

## 2016-09-22 MED ORDER — PHENYLEPHRINE HCL 10 MG/ML IJ SOLN
INTRAMUSCULAR | Status: DC | PRN
  Administered 2016-09-22 (×2): 100 ug via INTRAVENOUS

## 2016-09-22 MED ORDER — LIDOCAINE HCL (CARDIAC) 20 MG/ML IV SOLN
INTRAVENOUS | Status: DC | PRN
  Administered 2016-09-22: 100 mg via INTRAVENOUS

## 2016-09-22 MED ORDER — FENTANYL CITRATE (PF) 100 MCG/2ML IJ SOLN
25.0000 ug | INTRAMUSCULAR | Status: DC | PRN
  Administered 2016-09-22 (×4): 25 ug via INTRAVENOUS

## 2016-09-22 MED ORDER — CEFAZOLIN SODIUM-DEXTROSE 2-4 GM/100ML-% IV SOLN
INTRAVENOUS | Status: AC
  Administered 2016-09-22: 2 g via INTRAVENOUS
  Filled 2016-09-22: qty 100

## 2016-09-22 MED ORDER — OXYBUTYNIN CHLORIDE 5 MG PO TABS
5.0000 mg | ORAL_TABLET | Freq: Three times a day (TID) | ORAL | Status: DC | PRN
  Filled 2016-09-22: qty 1

## 2016-09-22 MED ORDER — GLYCOPYRROLATE 0.2 MG/ML IJ SOLN
INTRAMUSCULAR | Status: DC | PRN
  Administered 2016-09-22: 0.2 mg via INTRAVENOUS

## 2016-09-22 MED ORDER — HYDROCODONE-ACETAMINOPHEN 5-325 MG PO TABS: 1.0000 | ORAL_TABLET | Freq: Four times a day (QID) | ORAL | Status: DC | PRN

## 2016-09-22 MED ORDER — ONDANSETRON HCL 4 MG/2ML IJ SOLN
INTRAMUSCULAR | Status: DC | PRN
  Administered 2016-09-22: 4 mg via INTRAVENOUS

## 2016-09-22 MED ORDER — CEFAZOLIN SODIUM-DEXTROSE 2-4 GM/100ML-% IV SOLN
2.0000 g | INTRAVENOUS | Status: AC
  Administered 2016-09-22: 2 g via INTRAVENOUS

## 2016-09-22 MED ORDER — LACTATED RINGERS IV SOLN
INTRAVENOUS | Status: DC
  Administered 2016-09-22 (×2): via INTRAVENOUS

## 2016-09-22 MED ORDER — HYDROCODONE-ACETAMINOPHEN 5-325 MG PO TABS
ORAL_TABLET | ORAL | Status: AC
  Filled 2016-09-22: qty 1

## 2016-09-22 MED ORDER — DOCUSATE SODIUM 100 MG PO CAPS: 100.0000 mg | ORAL_CAPSULE | Freq: Two times a day (BID) | ORAL | 0 refills | Status: DC

## 2016-09-22 MED ORDER — BELLADONNA ALKALOIDS-OPIUM 16.2-60 MG RE SUPP
RECTAL | Status: AC
  Administered 2016-09-22: 1 via RECTAL
  Filled 2016-09-22: qty 1

## 2016-09-22 MED ORDER — BELLADONNA ALKALOIDS-OPIUM 16.2-60 MG RE SUPP
1.0000 | Freq: Once | RECTAL | Status: AC
  Administered 2016-09-22: 1 via RECTAL

## 2016-09-22 SURGICAL SUPPLY — 30 items
ADAPTER SCOPE UROLOK II (MISCELLANEOUS) ×2 IMPLANT
BAG DRAIN CYSTO-URO LG1000N (MISCELLANEOUS) ×2 IMPLANT
BASKET ZERO TIP 1.9FR (BASKET) ×2 IMPLANT
CATH URETL 5X70 OPEN END (CATHETERS) ×2 IMPLANT
CNTNR SPEC 2.5X3XGRAD LEK (MISCELLANEOUS) ×1
CONRAY 43 FOR UROLOGY 50M (MISCELLANEOUS) ×2 IMPLANT
CONT SPEC 4OZ STER OR WHT (MISCELLANEOUS) ×1
CONTAINER SPEC 2.5X3XGRAD LEK (MISCELLANEOUS) ×1 IMPLANT
DRAPE UTILITY 15X26 TOWEL STRL (DRAPES) ×2 IMPLANT
FIBER LASER LITHO 273 (Laser) ×2 IMPLANT
GLOVE BIO SURGEON STRL SZ 6.5 (GLOVE) ×2 IMPLANT
GOWN STRL REUS W/ TWL LRG LVL3 (GOWN DISPOSABLE) ×2 IMPLANT
GOWN STRL REUS W/TWL LRG LVL3 (GOWN DISPOSABLE) ×2
GUIDEWIRE SUPER STIFF (WIRE) ×2 IMPLANT
INTRODUCER DILATOR DOUBLE (INTRODUCER) IMPLANT
KIT RM TURNOVER CYSTO AR (KITS) ×2 IMPLANT
PACK CYSTO AR (MISCELLANEOUS) ×2 IMPLANT
PREP PVP WINGED SPONGE (MISCELLANEOUS) IMPLANT
PRESSURE BAG VENT LAB ×2 IMPLANT
SENSORWIRE 0.038 NOT ANGLED (WIRE) ×2
SET CYSTO W/LG BORE CLAMP LF (SET/KITS/TRAYS/PACK) ×2 IMPLANT
SET DILATOR URETRAL 8.5FR (MISCELLANEOUS) ×2 IMPLANT
SHEATH URETERAL 12FRX35CM (MISCELLANEOUS) ×2 IMPLANT
SOL .9 NS 3000ML IRR  AL (IV SOLUTION) ×1
SOL .9 NS 3000ML IRR UROMATIC (IV SOLUTION) ×1 IMPLANT
STENT URET 6FRX24 CONTOUR (STENTS) ×2 IMPLANT
STENT URET 6FRX26 CONTOUR (STENTS) IMPLANT
SURGILUBE 2OZ TUBE FLIPTOP (MISCELLANEOUS) ×2 IMPLANT
WATER STERILE IRR 1000ML POUR (IV SOLUTION) ×2 IMPLANT
WIRE SENSOR 0.038 NOT ANGLED (WIRE) ×1 IMPLANT

## 2016-09-22 NOTE — Discharge Instructions (Signed)
You have a ureteral stent in place.  This is a tube that extends from your kidney to your bladder.  This may cause urinary bleeding, burning with urination, and urinary frequency.  Please call our office or present to the ED if you develop fevers >101 or pain which is not able to be controlled with oral pain medications.  You may be given either Flomax and/ or ditropan to help with bladder spasms and stent pain in addition to pain medications.   ° °[facility] °[address], [address] °[place], [place] [address] °[phone] ° ° ° °AMBULATORY SURGERY  °DISCHARGE INSTRUCTIONS ° ° °1) The drugs that you were given will stay in your system until tomorrow so for the next 24 hours you should not: ° °A) Drive an automobile °B) Make any legal decisions °C) Drink any alcoholic beverage ° ° °2) You may resume regular meals tomorrow.  Today it is better to start with liquids and gradually work up to solid foods. ° °You may eat anything you prefer, but it is better to start with liquids, then soup and crackers, and gradually work up to solid foods. ° ° °3) Please notify your doctor immediately if you have any unusual bleeding, trouble breathing, redness and pain at the surgery site, drainage, fever, or pain not relieved by medication. ° ° ° °4) Additional Instructions: ° ° ° ° ° ° ° °Please contact your physician with any problems or Same Day Surgery at [phone], Monday through Friday 6 am to 4 pm, or [place] [facility] [place] Main number at [phone]. °

## 2016-09-22 NOTE — Transfer of Care (Signed)
Immediate Anesthesia Transfer of Care Note  Patient: Emily Moon  Procedure(s) Performed: Procedure(s): CYSTOSCOPY/URETEROSCOPY/HOLMIUM LASER/STENT PLACEMENT (Bilateral)  Patient Location: PACU  Anesthesia Type:General  Level of Consciousness: sedated  Airway & Oxygen Therapy: Patient Spontanous Breathing and Patient connected to face mask oxygen  Post-op Assessment: Report given to RN and Post -op Vital signs reviewed and stable  Post vital signs: Reviewed and stable  Last Vitals:  Vitals:   09/22/16 1011 09/22/16 1231  BP: (!) 148/82 (!) 143/78  Pulse: (!) 108 (!) 109  Resp: 16 16  Temp: 36.9 C [id number] C    Complications: No apparent anesthesia complications

## 2016-09-22 NOTE — Anesthesia Postprocedure Evaluation (Signed)
Anesthesia Post Note  Patient: Emily Moon  Procedure(s) Performed: Procedure(s) (LRB): CYSTOSCOPY/URETEROSCOPY/HOLMIUM LASER/STENT PLACEMENT (Bilateral)  Patient location during evaluation: PACU Anesthesia Type: General Level of consciousness: awake and alert and oriented Pain management: pain level controlled Vital Signs Assessment: post-procedure vital signs reviewed and stable Respiratory status: spontaneous breathing Cardiovascular status: blood pressure returned to baseline Anesthetic complications: no    Last Vitals:  Vitals:   09/22/16 1231 09/22/16 1246  BP: (!) 143/78 134/76  Pulse: (!) 109 (!) 104  Resp: 16 15  Temp: 36.3 C     Last Pain:  Vitals:   09/22/16 1241  TempSrc:   PainSc: 5                  Kamare Caspers

## 2016-09-22 NOTE — H&P (View-Only) (Signed)
09/16/2016 9:52 PM   [name] [birth year] [id number]  Referring provider: [name], NP [place], [place] [address]  Chief Complaint  Patient presents with  . Follow-up    RUS RESULTS    HPI: Patient is a 55 year old Caucasian who presents today to discuss her options concerning her 2 mm left proximal ureteral stone and right UPJ stone.    Background history Patient was referred by [name], NP and [facility] ED for nephrolithiasis.  She sought treatment in the ED on 09/07/2016.   In the ED, she received Toradol and Dilaudid.  Her UA contained TNTC RBC's/hpf.  Serum creatinine 1.12 in the ED, it was 0.77 five months ago.   She was discharged with tamsulosin, Zofran and Toradol.   CT Renal stone study performed on 09/07/2016 noted 8 mm right renal pelvic stone without hydronephrosis. 1-2 mm proximal left ureteral stone with mild fullness of the left renal collecting system.  Right nephrolithiasis.  I have independently reviewed the films.    She then had a follow-up renal ultrasound on 09/11/2016 which noted mild bilateral hydronephrosis. Right renal pelvis calculus measures 1.2 cm. Bilateral ureteral jets are noted.  I have independently reviewed the films.  Today, she is experiencing urinary frequency, nocturia, incontinence, straining to urinate, urinary hesitancy and a weak urinary stream. She is also having some nausea. She is having crampy pain in her right lower quadrant and left sided achiness and continues to pass sediment.  Her UA today is significant for greater than 10 WBC's and 3-10 rbc's.    Urine culture from 09/10/2016 contained mixed urogenital flora. A current urine culture from today's UA is pending.  Patient also has a history of MS and had been on Myrbetriq in the past for her bladder symptoms with good results and is requesting a refill for that medication at this time.  PMH: Past Medical History:  Diagnosis Date  . Arthritis      osteo  . Asthma   . Bipolar disorder ([place]) 05/21/14  . Cataracts, bilateral   . DDD (degenerative disc disease), cervical    also back  . Depression   . Dizziness    Positional  . Edema    feet/legs  . Fibromyalgia syndrome   . Fungal infection    Finger nails  . GERD (gastroesophageal reflux disease)   . Gout   . Headache    seasonal allergies  . Heart palpitations   . 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 Hypercholesterolemia   . Multiple sclerosis (HCC)    weakness  . Nephrolithiasis    kidney stones  . Osteoporosis    osteoarthritis  . Pneumonia   . PONV (postoperative nausea and vomiting)    no problem after cataract surgery  . Psoriasis   . Renal stone   . Shortness of breath dyspnea    wheezing  . Sleep apnea 2012   sleep study / slight, no interventions  . Urinary frequency     Surgical History: Past Surgical History:  Procedure Laterality Date  . CATARACT EXTRACTION W/PHACO Left 05/21/2015   Procedure: CATARACT EXTRACTION PHACO AND INTRAOCULAR LENS PLACEMENT (IOC);  Surgeon: Birder Robson, MD;  Location: [facility] ORS;  Service: Ophthalmology;  Laterality: Left;  [place] 00:35 AP% 22.9 CDE 8.11 fluid pack lot #[id number]  . CATARACT EXTRACTION W/PHACO Right 06/04/2015   Procedure: CATARACT EXTRACTION PHACO AND INTRAOCULAR LENS PLACEMENT (IOC);  Surgeon: Birder Robson, MD;  Location: [facility]  ORS;  Service: Ophthalmology;  Laterality: Right;  US:00:48 AP%: 10.5 CDE:5.08 Fluid lot #[id number]  . EYE SURGERY  2015   tissue biopsy  . FOOT SURGERY  2015  . JOINT REPLACEMENT Left 2013   hip replacement  . LITHOTRIPSY    . PTOSIS REPAIR Bilateral 02/18/2016   Procedure: BILATERAL PTOSIS REPAIR UPPER EYELIDS;  Surgeon: Karle Starch, MD;  Location: [place];  Service: Ophthalmology;  Laterality: Bilateral;  LEAVE PT EARLY AM  . thumb surgery Right   . TONSILLECTOMY  1973    Home Medications:    Medication List       Accurate as of 09/16/16  9:52 PM.  Always use your most recent med list.          ASPERCREME LIDOCAINE EX Apply topically.   aspirin 325 MG tablet Take 325 mg by mouth as needed for headache.   AZO TABS PO Take 2 tablets by mouth as needed.   BIOTIN PO Take 5,000 mg by mouth daily.   calcipotriene-betamethasone ointment Commonly known as:  TACLONEX Apply topically.   CALCIUM CARBONATE PO Take by mouth.   ciclopirox 8 % solution Commonly known as:  PENLAC Apply nightly over nail and surrounding skin. Apply daily over prior coat. After 7 days, may remove with alcohol and continue   clobetasol cream 0.05 % Commonly known as:  TEMOVATE Apply 1 application topically 2 (two) times daily.   diclofenac sodium 1 % Gel Commonly known as:  VOLTAREN Apply 4 g topically 4 (four) times daily.   DOCOSAHEXAENOIC ACID PO Take by mouth.   DULoxetine 20 MG capsule Commonly known as:  CYMBALTA Take 40 mg by mouth daily. pm   FISH OIL PO Take 4 capsules by mouth at bedtime.   fluticasone 50 MCG/ACT nasal spray Commonly known as:  FLONASE Place 1 spray into both nostrils daily.   HYDROcodone-acetaminophen 10-325 MG tablet Commonly known as:  NORCO Take 1 tablet by mouth every 6 (six) hours as needed for severe pain.   hydroquinone 4 % cream Reported on 02/18/2016   hydrOXYzine 50 MG capsule Commonly known as:  VISTARIL Take by mouth.   ketorolac 10 MG tablet Commonly known as:  TORADOL Take 1 tablet (10 mg total) by mouth every 6 (six) hours as needed.   lamoTRIgine 150 MG tablet Commonly known as:  LAMICTAL Take two tablets by mouth daily   levalbuterol 45 MCG/ACT inhaler Commonly known as:  XOPENEX HFA Inhale 1-2 puffs into the lungs every 6 (six) hours as needed for wheezing.   Lysine 1000 MG Tabs Take 2,000 mg by mouth daily.   MYRBETRIQ 25 MG Tb24 tablet Generic drug:  mirabegron ER TAKE ONE TABLET BY MOUTH ONE TIME DAILY   nicotine 21 mg/24hr patch Commonly known as:  NICODERM CQ - dosed  in mg/24 hours Place 1 patch (21 mg total) onto the skin daily.   omeprazole 20 MG capsule Commonly known as:  PRILOSEC Take 20 mg by mouth as needed.   ondansetron 4 MG disintegrating tablet Commonly known as:  ZOFRAN ODT Take 1 tablet (4 mg total) by mouth every 8 (eight) hours as needed for nausea.   oxyCODONE 5 MG immediate release tablet Commonly known as:  ROXICODONE Take 1 tablet (5 mg total) by mouth every 4 (four) hours as needed for severe pain.   polyethylene glycol powder powder Commonly known as:  GLYCOLAX/MIRALAX MIX 17 GRAMS (1 CAPFUL) WITH 4-8 OZ OF LIQUID AND TAKE BY MOUTH  TWICE DAILY AS NEEDED   ranitidine 150 MG tablet Commonly known as:  ZANTAC Take 150 mg by mouth 2 (two) times daily as needed for heartburn. Reported on 02/12/2016   tamsulosin 0.4 MG Caps capsule Commonly known as:  FLOMAX Take 1 capsule (0.4 mg total) by mouth 2 (two) times daily.   tamsulosin 0.4 MG Caps capsule Commonly known as:  FLOMAX Take 1 capsule (0.4 mg total) by mouth daily.   tamsulosin 0.4 MG Caps capsule Commonly known as:  FLOMAX Take 1 capsule (0.4 mg total) by mouth daily.   temazepam 30 MG capsule Commonly known as:  RESTORIL TAKE ONE TO TWO CAPSULES BY MOUTH NIGHTLY AT BEDTIME AS NEEDED   tiZANidine 4 MG tablet Commonly known as:  ZANAFLEX TAKE 2 TABLETS (8 MG TOTAL) BY MOUTH 3 (THREE) TIMES DAILY.   VITAMIN D-3 PO Take 10,000 Units by mouth daily.   zolpidem 10 MG tablet Commonly known as:  AMBIEN Take 10 mg by mouth at bedtime as needed for sleep.       Allergies:  Allergies  Allergen Reactions  . Albuterol Shortness Of Breath and Other (See Comments)    Makes pt feel jittery/ tacycardic  . Halcion [Triazolam] Other (See Comments)    Dizziness,headaches,bladder problems  . Levaquin [Levofloxacin In D5w] Diarrhea and Itching    Shoulder pain  . Naproxen Sodium Swelling  . Tylenol [Acetaminophen] Swelling  . Ultram [Tramadol Hcl] Itching and Nausea  And Vomiting    dizzyness  . Aripiprazole Other (See Comments)    Pt not aware  . Cefaclor     Doesn't remember  . Diclofenac Sodium     Doesn't remember  . Levofloxacin Diarrhea and Itching  . Naproxen Swelling  . Sulfa Antibiotics     Doesn't remember  . Sulfamethoxazole Other (See Comments)    Pt cannot recall  . Tramadol Itching and Nausea And Vomiting  . Voltaren [Diclofenac]   . Ibuprofen Swelling    Can take small amounts    Family History: Family History  Problem Relation Age of Onset  . Cancer Father     Abdomen with mastasis  . Cancer Mother   . Heart disease Mother   . Kidney disease Neg Hx   . Bladder Cancer Neg Hx   . Prostate cancer Neg Hx     Social History:  reports that she quit smoking about 3 years ago. Her smoking use included Cigarettes. She has a 12.50 pack-year smoking history. She has never used smokeless tobacco. She reports that she does not drink alcohol or use drugs.  ROS: UROLOGY Frequent Urination?: Yes Hard to postpone urination?: No Burning/pain with urination?: No Get up at night to urinate?: Yes Leakage of urine?: Yes Urine stream starts and stops?: No Trouble starting stream?: Yes Do you have to strain to urinate?: Yes Blood in urine?: No Urinary tract infection?: No Sexually transmitted disease?: No Injury to kidneys or bladder?: No Painful intercourse?: No Weak stream?: Yes Currently pregnant?: No Vaginal bleeding?: No Last menstrual period?: n  Gastrointestinal Nausea?: Yes Vomiting?: No Indigestion/heartburn?: Yes Diarrhea?: Yes Constipation?: Yes  Constitutional Fever: Yes Night sweats?: Yes Weight loss?: No Fatigue?: Yes  Skin Skin rash/lesions?: Yes Itching?: No  Eyes Blurred vision?: No Double vision?: No  Ears/Nose/Throat Sore throat?: No Sinus problems?: Yes  Hematologic/Lymphatic Swollen glands?: No Easy bruising?: No  Cardiovascular Leg swelling?: No Chest pain?:  No  Respiratory Cough?: No Shortness of breath?: Yes  Endocrine Excessive thirst?: No  Musculoskeletal Back pain?: Yes Joint pain?: Yes  Neurological Headaches?: Yes Dizziness?: No  Psychologic Depression?: Yes Anxiety?: Yes  Physical Exam: BP 128/73 (BP Location: Left Arm, Patient Position: Sitting, Cuff Size: Large)   Pulse (!) 101   Temp 99.5 F (37.5 C) (Oral)   Ht _0  (1.753 m)   Wt 212 lb (96.2 kg)   LMP 08/23/2014   BMI 31.31 kg/m   Constitutional: Well nourished. Alert and oriented, No acute distress. HEENT: [place] AT, moist mucus membranes. Trachea midline, no masses. Cardiovascular: No clubbing, cyanosis, or edema. Respiratory: Normal respiratory effort, no increased work of breathing. GI: Abdomen is soft, non tender, non distended, no abdominal masses. Liver and spleen not palpable.  No hernias appreciated.  Stool sample for occult testing is not indicated.   GU: No CVA tenderness.  No bladder fullness or masses.   Skin: No rashes, bruises or suspicious lesions. Lymph: No cervical or inguinal adenopathy. Neurologic: Grossly intact, no focal deficits, moving all 4 extremities. Psychiatric: Normal mood and affect.  Laboratory Data: Lab Results  Component Value Date   WBC 7.8 09/09/2016   HGB 12.8 09/09/2016   HCT 38.1 09/09/2016   MCV 94.5 09/09/2016   PLT 340.0 09/09/2016    Lab Results  Component Value Date   CREATININE 0.94 09/09/2016    Lab Results  Component Value Date   HGBA1C 6.0 03/24/2016    Lab Results  Component Value Date   TSH 2.03 10/18/2015       Component Value Date/Time   CHOL 327 (H) 03/24/2016 0814   HDL 46.60 03/24/2016 0814   CHOLHDL 7 03/24/2016 0814   VLDL 68.0 (H) 03/24/2016 0814   LDLCALC 223 (H) 11/13/2014 0908    Lab Results  Component Value Date   AST 13 03/24/2016   Lab Results  Component Value Date   ALT 13 03/24/2016    Urinalysis Results for orders placed or performed in visit on 09/16/16   Microscopic Examination  Result Value Ref Range   WBC, UA >30 (A) 0 - 5 /hpf   RBC, UA 3-10 (A) 0 - 2 /hpf   Epithelial Cells (non renal) 0-10 0 - 10 /hpf   Bacteria, UA Few None seen/Few  Urinalysis, Complete  Result Value Ref Range   Specific Gravity, UA <1.005 (L) 1.005 - 1.030   pH, UA 6.0 5.0 - 7.5   Color, UA Yellow Yellow   Appearance Ur Clear Clear   Leukocytes, UA 1+ (A) Negative   Protein, UA Negative Negative/Trace   Glucose, UA Negative Negative   Ketones, UA Negative Negative   RBC, UA 2+ (A) Negative   Bilirubin, UA Negative Negative   Urobilinogen, Ur 0.2 0.2 - 1.0 mg/dL   Nitrite, UA Negative Negative   Microscopic Examination See below:     Pertinent Imaging: CLINICAL DATA:  Left side back pain beginning last night, left flank pain.  EXAM: CT ABDOMEN AND PELVIS WITHOUT CONTRAST  TECHNIQUE: Multidetector CT imaging of the abdomen and pelvis was performed following the standard protocol without IV contrast.  COMPARISON:  07/28/2013  FINDINGS: Lower chest: Lung bases are clear. No effusions. Heart is normal size.  Hepatobiliary: No focal hepatic abnormality. Gallbladder unremarkable.  Pancreas: No focal abnormality or ductal dilatation.  Spleen: No focal abnormality.  Normal size.  Adrenals/Urinary Tract: 8 mm right renal pelvic stone without hydronephrosis. Other smaller punctate nonobstructing right renal stones. There is a punctate 1-2 mm proximal left ureteral stone with mild fullness of  the left renal collecting system. Adrenal glands and urinary bladder unremarkable.  Stomach/Bowel: Stomach, large and small bowel grossly unremarkable.  Vascular/Lymphatic: No evidence of aneurysm or adenopathy.  Reproductive: Uterus and adnexa unremarkable.  No mass.  Other: No free fluid or free air.  Musculoskeletal: Prior left hip replacement. Degenerative disc and facet disease in the lower lumbar spine. Slight anterolisthesis of L4  on L5 related to facet disease.  IMPRESSION: 8 mm right renal pelvic stone without hydronephrosis. 1-2 mm proximal left ureteral stone with mild fullness of the left renal collecting system.  Right nephrolithiasis.   Electronically Signed   By: Rolm Baptise M.D.   On: 09/07/2016 10:07  CLINICAL DATA:  Hydronephrosis  EXAM: RENAL / URINARY TRACT ULTRASOUND COMPLETE  COMPARISON:  CT scan 09/07/2016  FINDINGS: Right Kidney:  Length: 11.6 cm. There is normal echogenicity. Mild right hydronephrosis. There is a shadowing calculus in right renal pelvis measures 1.2 cm.  Left Kidney:  Length: 12.5 cm. Normal echogenicity. Mild hydronephrosis. No shadowing calculi.  Bladder:  Appears normal for degree of bladder distention. Bilateral ureteral jets are noted.  IMPRESSION: Mild bilateral hydronephrosis. Right renal pelvis calculus measures 1.2 cm. Bilateral ureteral jets are noted.   Electronically Signed   By: Lahoma Crocker M.D.   On: 09/11/2016 09:48   Assessment & Plan:    Patient will undergo a LEFT ureteroscopy with possible laser lithotripsy and possible ureteral stent placement for a 2 mm stone located in the proximal LEFT ureter with a possible RIGHT ureteroscopy with possible laser lithotripsy with a RIGHT ureteral stent placement for her RIGHT UPJ stone.     1. Left ureteral stone  - patient still having left sided pain, continue with MET, given a strainer and instructed to continue straining the urine, tamsulosin is refilled  - I explained to the patient that since she is still symptomatic on her left side, we will explore her left ureter with the ureteroscope to see if the 2 mm stone is still present, if it is still within her ureter, we will proceed with laser lithotripsy and she will have a ureteral stent left in place  - if her right ureter and kidney are found to be obstructed during the procedure, she will also have a right ureteral stent  placed but the right UPJ stone will not be addressed during this procedure, she will have to return to the operating room for a staged procedure on a different day  - explained to the patient how the procedure is performed and the risks involved  - informed patient that they will have a stent or stents placed during the procedure and will remain in place after the procedure for a short time.   - stent may be removed in the office with a cystoscope or patient may be instructed to remove the stent themselves by the string  - described "stent pain" as feelings of needing to urinate/overactive bladder and a warm, tingling sensation to intense pain in the affected flank  - injury to the ureter is the most common intra-operative risk, it may result in an open procedure to correct the defect  - infection and bleeding are also risks  - explained the risks of general anesthesia, such as: MI, CVA, paralysis, coma and/or death.  - advised to contact our office or seek treatment in the ED if becomes febrile or pain/ vomiting are difficult control in order to arrange for emergent/urgent intervention  - Urinalysis, Complete  - CULTURE,  URINE COMPREHENSIVE  - patient given oxycodone IR 5 mg,  # 10 tablets   2. Right renal stone  - advised patient that the right stone cannot be addressed, unless emergently, until the left stone has passed and the left kidney has healed  - explained to the patient that if the left collecting system is clear of the 2 mm stone, the right stone may be able to be addressed with ureteroscopy with laser lithotripsy and a ureteral stent placed, but it is more likely that her right UPJ  will need to be addressed in a staged procedure and she may have a left and right ureteral stent in place after the procedure   3. Hydronephrosis  - Patient was found to have left hydronephrosis due to a left ureteral stone.  A RUS will be obtained one month after she has passed the left ureteral  stone  4. Microscopic hematuria  - We will continue to monitor the patient's UA after the treatment/passage of the stone to ensure the hematuria has resolved.  If hematuria persists, we will pursue a hematuria workup with CT Urogram and cystoscopy if appropriate.  5. Neurogenic bladder  - Samples of Myrbetriq 25 mg daily, #28 are given to the patient  - We'll address this further once she has undergone definitive treatment for her stones  Return for LEFT URS with possible laser lithotripsy with possible ureteral stent placement with possible RIGHT . URS/LL and right ureteral stent placement  These notes generated with voice recognition software. I apologize for typographical errors.  Zara Council, [facility] [address] [place], [place] [address] [phone]

## 2016-09-22 NOTE — Interval H&P Note (Signed)
History and Physical Interval Note:  09/22/2016 10:16 AM  Emily Moon  has presented today for surgery, with the diagnosis of NEPHROLITHIASIS  The various methods of treatment have been discussed with the patient and family. After consideration of risks, benefits and other options for treatment, the patient has consented to  Procedure(s): CYSTOSCOPY/URETEROSCOPY/HOLMIUM LASER/STENT PLACEMENT (Left) as a surgical intervention .  The patient's history has been reviewed, patient examined, no change in status, stable for surgery.  I have reviewed the patient's chart and labs.  Questions were answered to the patient's satisfaction.    RRR CTAB  Left URS, LL, stent with possible right URS, LL, stent  Hollice Espy

## 2016-09-22 NOTE — Anesthesia Preprocedure Evaluation (Addendum)
Anesthesia Evaluation  Patient identified by MRN, date of birth, ID band Patient awake    Reviewed: Allergy & Precautions, NPO status , Patient's Chart, lab work & pertinent test results  History of Anesthesia Complications (+) PONV and history of anesthetic complications  Airway Mallampati: III  TM Distance: >3 FB     Dental  (+) Chipped   Pulmonary shortness of breath and with exertion, asthma , sleep apnea , pneumonia, resolved, former smoker,    Pulmonary exam normal        Cardiovascular negative cardio ROS Normal cardiovascular exam     Neuro/Psych  Headaches, PSYCHIATRIC DISORDERS Depression Bipolar Disorder    GI/Hepatic GERD  Medicated and Controlled,  Endo/Other  negative endocrine ROS  Renal/GU stones     Musculoskeletal  (+) Arthritis , Osteoarthritis,    Abdominal Normal abdominal exam  (+)   Peds negative pediatric ROS (+)  Hematology negative hematology ROS (+)   Anesthesia Other Findings   Reproductive/Obstetrics                            Anesthesia Physical Anesthesia Plan  ASA: III  Anesthesia Plan: General   Post-op Pain Management:    Induction: Intravenous  Airway Management Planned: Oral ETT  Additional Equipment:   Intra-op Plan:   Post-operative Plan: Extubation in OR  Informed Consent: I have reviewed the patients History and Physical, chart, labs and discussed the procedure including the risks, benefits and alternatives for the proposed anesthesia with the patient or authorized representative who has indicated his/her understanding and acceptance.   Dental advisory given  Plan Discussed with: CRNA and Surgeon  Anesthesia Plan Comments:         Anesthesia Quick Evaluation

## 2016-09-22 NOTE — Anesthesia Procedure Notes (Signed)
Procedure Name: Intubation Date/Time: 09/22/2016 10:48 AM Performed by: Doreen Salvage Pre-anesthesia Checklist: Patient identified, Patient being monitored, Timeout performed, Emergency Drugs available and Suction available Patient Re-evaluated:Patient Re-evaluated prior to inductionOxygen Delivery Method: Circle system utilized Preoxygenation: Pre-oxygenation with 100% oxygen Intubation Type: IV induction Ventilation: Mask ventilation without difficulty Laryngoscope Size: Mac and 3 Grade View: Grade III Tube type: Oral Tube size: 7.0 mm Number of attempts: 1 Airway Equipment and Method: Stylet Placement Confirmation: ETT inserted through vocal cords under direct vision,  positive ETCO2 and breath sounds checked- equal and bilateral Secured at: 21 cm Tube secured with: Tape Dental Injury: Teeth and Oropharynx as per pre-operative assessment  Difficulty Due To: Difficult Airway- due to anterior larynx

## 2016-09-23 ENCOUNTER — Encounter: Payer: Self-pay | Admitting: Urology

## 2016-09-24 ENCOUNTER — Telehealth: Payer: Self-pay

## 2016-09-24 NOTE — Telephone Encounter (Signed)
Pt called requesting IV pain medication. Pt stated that she is in severe pain, has been taking hydrocodone ,given by another physician, and flexeril without any relief. Pt stated that she is under contract with [facility] and therefore shes not asking for pain pills. Reinforced with pt to add tylenol and/or ibuprofen q4h if needed to help with the pain. Pt then stated she has toradol but doesn't want to take it due to heart burn. Reinforced with pt that would be the best medication to help with the pain at this point and then take something for the side effects. Pt voiced understanding.

## 2016-09-24 NOTE — Op Note (Signed)
Date of procedure: 09/22/16  Preoperative diagnosis:  1. Bilateral nephrolithiasis 2. Left flank pain  Postoperative diagnosis:  1. Same as above   Procedure: 1. Bilateral ureteroscopy 2. Bilateral retrograde pyelogram 3. Bilateral laser lithotripsy 4. Bilateral ureteral stent placement  Surgeon: Hollice Espy, MD  Anesthesia: General  Complications: None  Intraoperative findings: Narrowing at the left UPJ with some small stone fragments but no obvious large stone. Large 1 cm right UPJ stone along with 2 additional nonobstructing stones treated.  EBL: Minimal  Specimens: None  Drains: 6 x 24 French double-J ureteral stent 2 bilaterally  Indication: Emily Moon is a 55 y.o. patient with left flank pain with a small 3 mm left proximal ureteral stone as well as a 1 cm right UPJ stone as well as injecting stones on the right.  After reviewing the management options for treatment, she elected to proceed with the above surgical procedure(s). We have discussed the potential benefits and risks of the procedure, side effects of the proposed treatment, the likelihood of the patient achieving the goals of the procedure, and any potential problems that might occur during the procedure or recuperation. Informed consent has been obtained.  Description of procedure:  The patient was taken to the operating room and general anesthesia was induced.  The patient was placed in the dorsal lithotomy position, prepped and draped in the usual sterile fashion, and preoperative antibiotics were administered. A preoperative time-out was performed.   A 21 French scope was advanced per urethra into the bladder. Attention was turned to the left ureteral orifice which was cannulated using a 5 [place] open-ended ureteral catheter just within the UO. Gentle retropyelogram was performed which did reveal a transition point within the proximal ureter near the level of the UPJ as well as moderate hydro-nephrosis  and caliectasis on this side. A sensor wire was then advanced up to level of the kidney without difficulty. I first attempted to advance an 4.5 French semirigid ureteroscope up to the level of the transition but was only able to reach the mid ureter with this scope. I then attempted an 8 French dual-lumen flexible ureteroscope but was unable to traverse the distal ureter due to the caliber of the scope. I then used in a 10 [place] ureteral dilator to gently dilate the ureter. I was ultimately able to accommodate a 7 [place] single channel flexible ureteroscope all the way up to level of the kidney. There was some narrowing at the proximal ureter/UPJ but no obvious stones identified. Within a midpole calyx, there were some debris from stones but no obvious stone identified. This was obliterated using a 273  laser fiber using settings of 0.2 J and 40 hertz. Each and every calyx was then directly visualized. A second retrograde pyelogram was performed. A roadmap of the kidney to ensure the each and every calyx has been visualized. There is no obvious large stone. The scope was then backed down the length of the ureter carefully inspecting along the way. There is no additional stone fragments or ureteral injury appreciated. A 6 x 24 French double-J ureteral stent was then advanced over the wire up to level of the kidney under fluoroscopic guidance. The wire was partially drawn until full coil stent within the renal pelvis. Dorsum fully withdrawn and a full coil was noted within the bladder both cystoscopically and fluoroscopically.  Next, attention was turned to the right ureteral orifice. A gentle retropyelogram is performed on this side which showed a filling defect within  the renal pelvis consistent with her known stone on the side, otherwise no hydroureteronephrosis. Prior to performing the retrograde, the stone could be easily seen on scout films as well. A sensor wire was then advanced up to level of the kidney  as well as a superstiff wire over which a single channel ureteroscope was advanced easily to the renal pelvis. The stone was then encountered. Using dusting settings of the laser, the stone in the UPJ was carefully dusted into small particles proximal to the size of the tip of the laser fiber. 2 additional nonobstructing stones were identified in the mid and lower pole calyces. These were also obliterated. I then attempted to use a 1.9 [place] to plus nitinol basket to basket out some fragments, however, given the extremely small size of the fragments, none could be grasped. Again, an additional retropyelogram is performed of the side to create a roadmap to ensure that each never calyx has been identified. Once the collecting system had been adequately cleared of stone burden on the side, scope was backed down the length of the ureter again without identification of any stone fragments or injury to the ureter. A 6 x 24 French double-J ureteral stent was advanced over the wire up to level of the kidney. The wire was partially drawn until full coil was noted within the renal pelvis. Dorsum fully withdrawn and a full coil was noted within the bladder. The bladder was then drained using the access sheath of the cystoscope. The patient was cleaned and dried, repositioned the supine position, reversed from anesthesia, taken to the PACU in stable condition.  Plan: Patient will maintain her ureteral stents for 2 weeks given the amount of stone dust needed to pass on the right.  Her scripts are sent to her local pharmacy. No additional narcotics was given his her sister stated she has plenty from another provider.  Hollice Espy, M.D.

## 2016-09-25 ENCOUNTER — Other Ambulatory Visit: Payer: Self-pay | Admitting: Primary Care

## 2016-09-25 ENCOUNTER — Encounter: Payer: Self-pay | Admitting: Urology

## 2016-09-25 ENCOUNTER — Encounter: Payer: Self-pay | Admitting: Primary Care

## 2016-09-25 DIAGNOSIS — N200 Calculus of kidney: Secondary | ICD-10-CM

## 2016-09-25 MED ORDER — KETOROLAC TROMETHAMINE 10 MG PO TABS: 10.0000 mg | ORAL_TABLET | Freq: Four times a day (QID) | ORAL | 0 refills | Status: DC | PRN

## 2016-09-26 ENCOUNTER — Encounter: Payer: Self-pay | Admitting: Urology

## 2016-09-29 ENCOUNTER — Encounter: Payer: Self-pay | Admitting: Primary Care

## 2016-09-30 IMAGING — US US RENAL
1 series · 14 of 25 positions shown · non-contrast
Comparison: CT scan [DATE]

CLINICAL DATA: Hydronephrosis

EXAM:
RENAL / URINARY TRACT ULTRASOUND COMPLETE

[Series 1: us renal · 0.28mm/px · 14 of 62 slices shown]
[im 1/62]
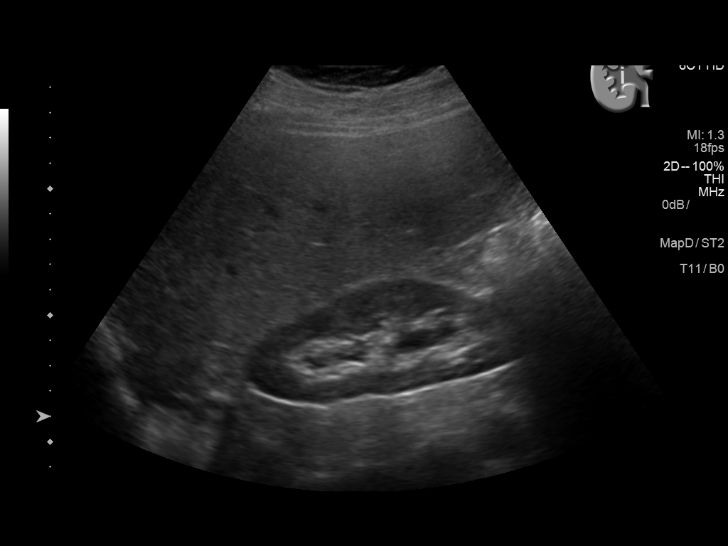
[im 6/62]
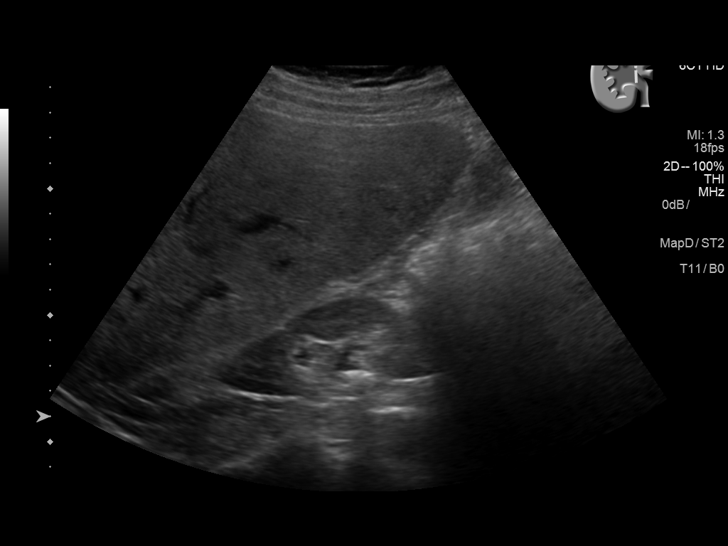
[im 11/62]
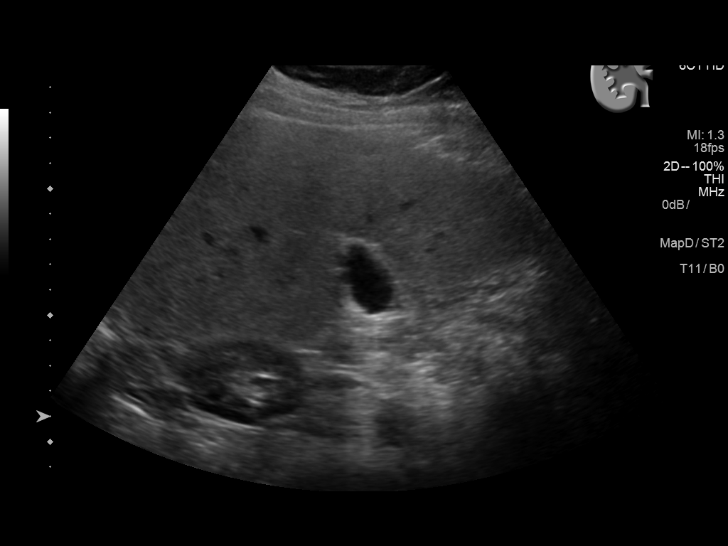
[im 16/62]
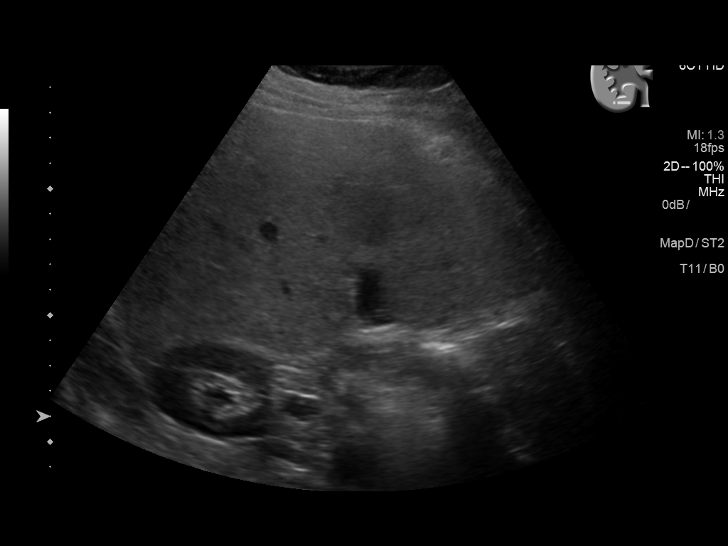
[im 21/62]
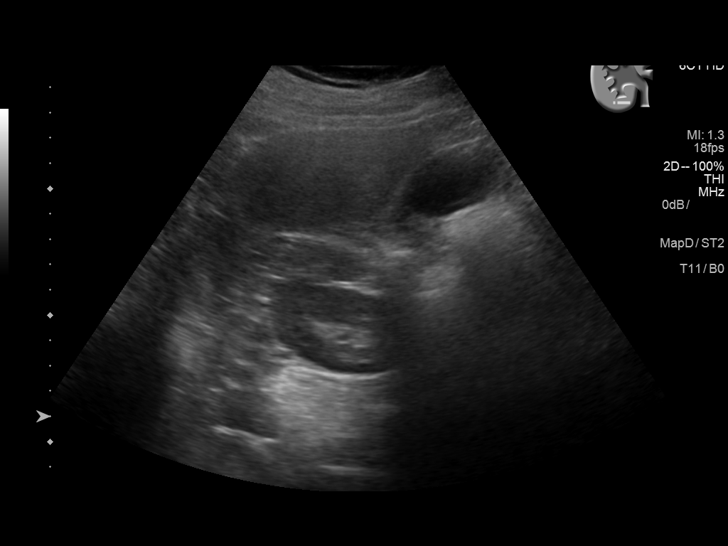
[im 23/62]
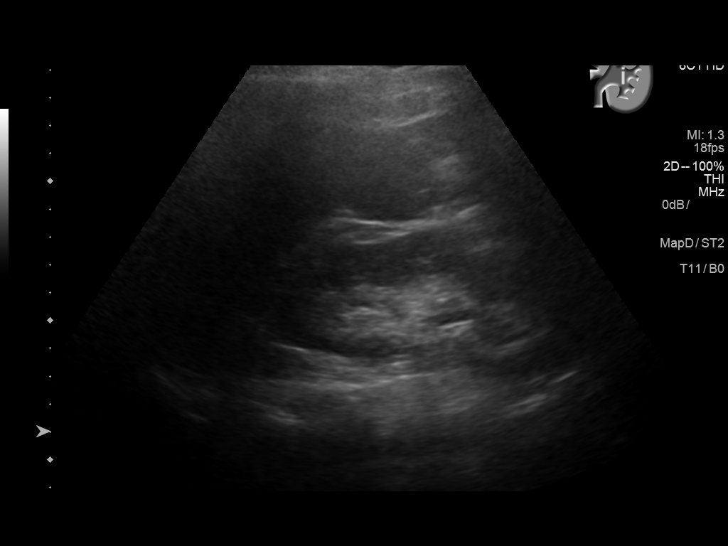
[im 28/62]
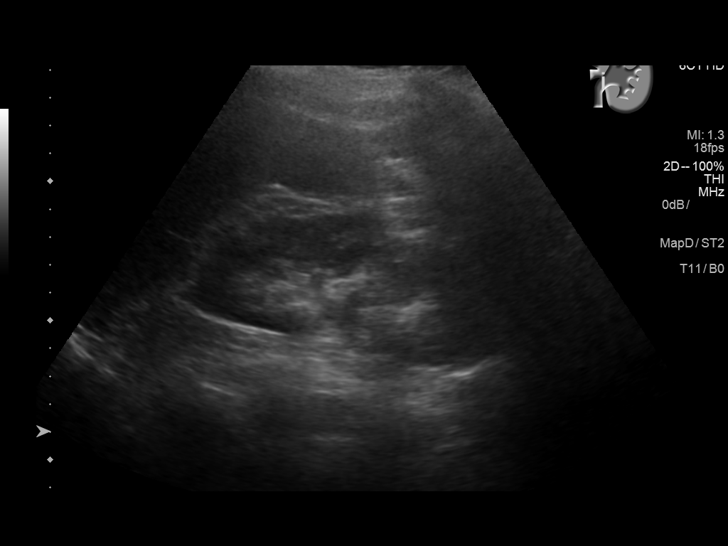
[im 34/62]
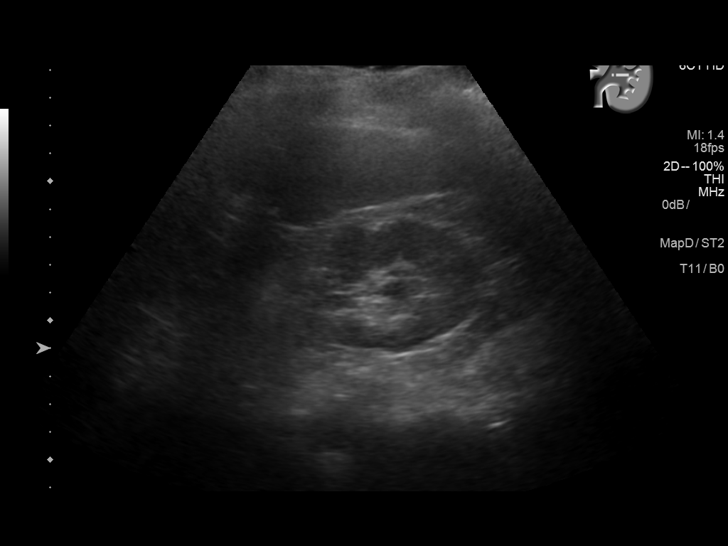
[im 39/62]
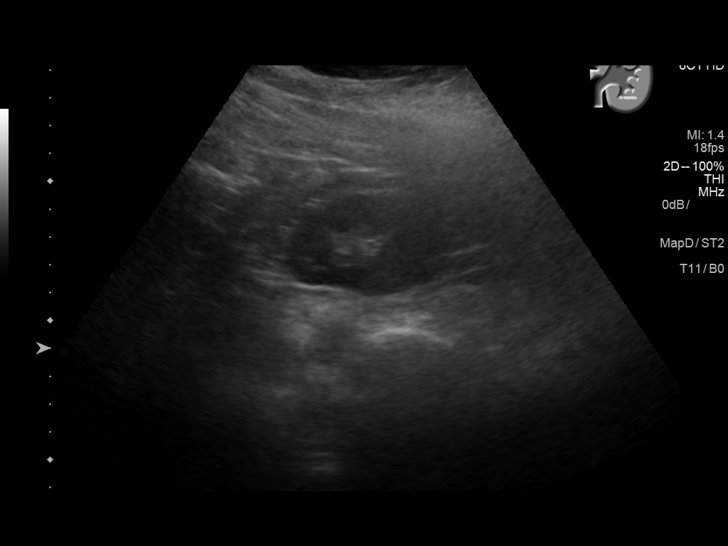
[im 41/62]
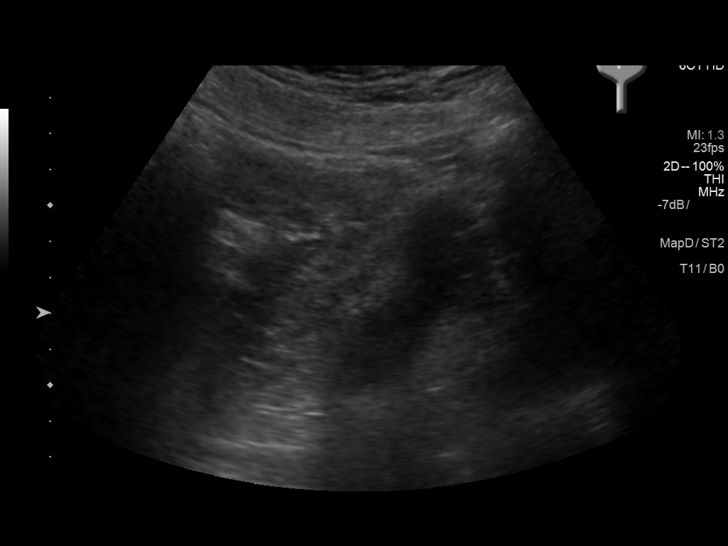
[im 46/62]
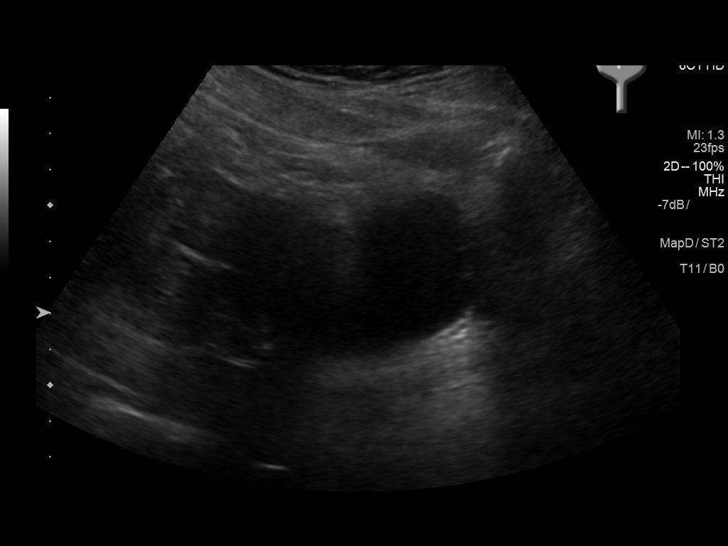
[im 51/62]
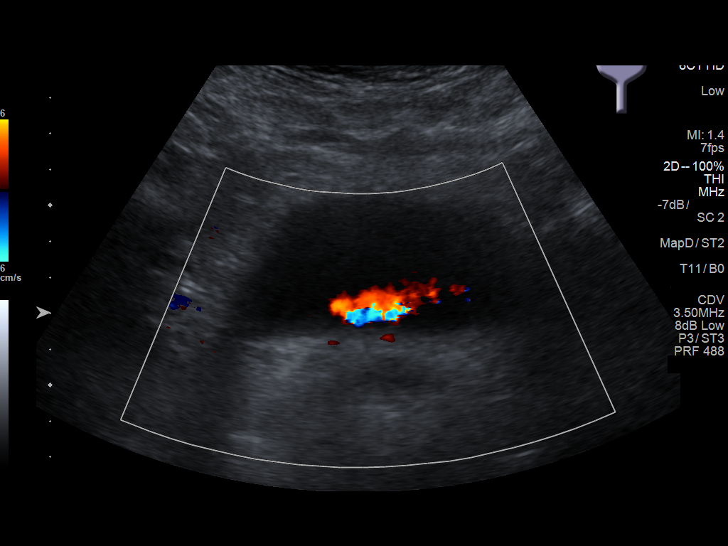
[im 56/62]
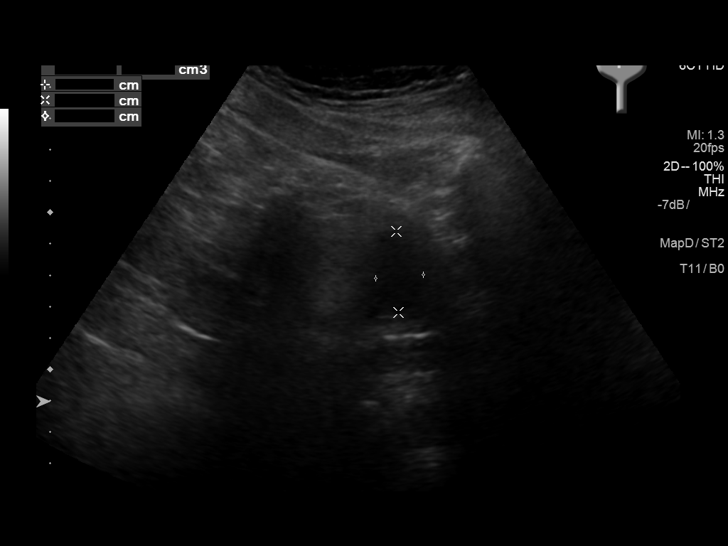
[im 62/62]
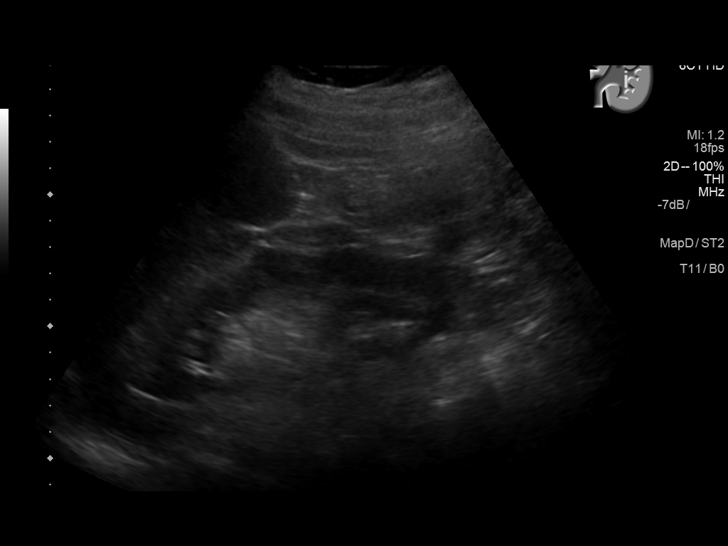

[14 of 25 positions shown; findings below may reference images not displayed]

FINDINGS: Right Kidney:

Length: 11.6 cm. There is normal echogenicity. Mild right
hydronephrosis. There is a shadowing calculus in right renal pelvis
measures 1.2 cm.

Left Kidney:

Length: 12.5 cm. Normal echogenicity. Mild hydronephrosis. No
shadowing calculi.

Bladder:

Appears normal for degree of bladder distention. Bilateral ureteral
jets are noted.
IMPRESSION: Mild bilateral hydronephrosis. Right renal pelvis calculus measures
1.2 cm. Bilateral ureteral jets are noted.

## 2016-10-06 ENCOUNTER — Encounter: Payer: Self-pay | Admitting: Primary Care

## 2016-10-06 ENCOUNTER — Other Ambulatory Visit: Payer: Self-pay | Admitting: Primary Care

## 2016-10-06 DIAGNOSIS — M1990 Unspecified osteoarthritis, unspecified site: Secondary | ICD-10-CM

## 2016-10-06 MED ORDER — HYDROCODONE-ACETAMINOPHEN 10-325 MG PO TABS: 1.0000 | ORAL_TABLET | Freq: Three times a day (TID) | ORAL | 0 refills | Status: DC | PRN

## 2016-10-06 MED ORDER — HYDROCODONE-ACETAMINOPHEN 10-325 MG PO TABS: 1.0000 | ORAL_TABLET | Freq: Four times a day (QID) | ORAL | 0 refills | Status: DC | PRN

## 2016-10-07 ENCOUNTER — Ambulatory Visit (INDEPENDENT_AMBULATORY_CARE_PROVIDER_SITE_OTHER): Payer: PPO | Admitting: Urology

## 2016-10-07 VITALS — BP 124/73 | HR 111 | Ht 69.0 in | Wt 209.0 lb

## 2016-10-07 DIAGNOSIS — N200 Calculus of kidney: Secondary | ICD-10-CM

## 2016-10-07 LAB — URINALYSIS, COMPLETE
BILIRUBIN UA: NEGATIVE
GLUCOSE, UA: NEGATIVE
Nitrite, UA: NEGATIVE
SPEC GRAV UA: 1.025 (ref 1.005–1.030)
Urobilinogen, Ur: 0.2 mg/dL (ref 0.2–1.0)
pH, UA: 6 (ref 5.0–7.5)

## 2016-10-07 LAB — MICROSCOPIC EXAMINATION
Bacteria, UA: NONE SEEN
Epithelial Cells (non renal): NONE SEEN /hpf (ref 0–10)
RBC, UA: >30 /hpf — AB (ref 0–?)
WBC, UA: >30 /hpf — AB (ref 0–?)

## 2016-10-07 MED ORDER — LIDOCAINE HCL 2 % EX GEL
1.0000 "application " | Freq: Once | CUTANEOUS | Status: AC
  Administered 2016-10-07: 1 via URETHRAL

## 2016-10-07 MED ORDER — CIPROFLOXACIN HCL 500 MG PO TABS
500.0000 mg | ORAL_TABLET | Freq: Once | ORAL | Status: AC
  Administered 2016-10-07: 500 mg via ORAL

## 2016-10-07 NOTE — Progress Notes (Signed)
   10/07/16  CC:  Chief Complaint  Patient presents with  . Cysto Stent Removal    HPI:  55 year old female with a history of multiple sclerosis who underwent bilateral ureteroscopy on 09/22/2016. She tolerated the procedure well. She returns today for bilateral stent removal. She has been passing small fragments. No fevers or chills. She does have some discomfort from her stents otherwise no complaints.  Blood pressure 124/73, pulse (!) 111, height 5\' 9"  (1.753 m), weight 209 lb (94.8 kg), last menstrual period 08/23/2014. NED. A&Ox3.   No respiratory distress   Abd soft, NT, ND Normal external genitalia with patent urethral meatus  Cystoscopy/ Stent removal procedure  Patient identification was confirmed, informed consent was obtained, and patient was prepped using Betadine solution.  Lidocaine jelly was administered per urethral meatus.    Preoperative abx where received prior to procedure.    Procedure: - Flexible cystoscope introduced, without any difficulty.   - Thorough search of the bladder revealed:    normal urethral meatus  Stent seen emanating from right ureteral orifice, grasped with stent graspers, and removed in entirety.    The scope was then reintroduced and the left-sided stent was grasped and brought out through the urethral meatus, removed in entirety.  Post-Procedure: - Patient tolerated the procedure well   Assessment/ Plan:  1. Nephrolithiasis Bilateral nephrolithiasis s/p bilateral ureteroscopy on 09/22/2016 Stent removed today 2 without complication Warning symptoms reviewed and indication for urgent intervention/evaluation Recommend follow-up in 4 weeks with a renal ultrasound Patient has questions today about kidney stone prevention. We will likely proceed with [id number] urine metabolic workup which will be discussed next visit. - Urinalysis, Complete - ciprofloxacin (CIPRO) tablet 500 mg; Take 1 tablet (500 mg total) by mouth once. - lidocaine  (XYLOCAINE) 2 % jelly 1 application; Place 1 application into the urethra once. - US Renal; Future   Hollice Espy, MD

## 2016-10-10 ENCOUNTER — Emergency Department: Payer: PPO

## 2016-10-10 ENCOUNTER — Other Ambulatory Visit: Payer: Self-pay | Admitting: Urology

## 2016-10-10 ENCOUNTER — Encounter: Payer: Self-pay | Admitting: Emergency Medicine

## 2016-10-10 ENCOUNTER — Inpatient Hospital Stay
Admission: EM | Admit: 2016-10-10 | Discharge: 2016-10-14 | DRG: 872 | Disposition: A | Discharge status: Home / Self Care | Payer: PPO | Attending: Internal Medicine | Admitting: Internal Medicine

## 2016-10-10 DIAGNOSIS — Z87442 Personal history of urinary calculi: Secondary | ICD-10-CM | POA: Diagnosis not present

## 2016-10-10 DIAGNOSIS — N12 Tubulo-interstitial nephritis, not specified as acute or chronic: Secondary | ICD-10-CM | POA: Diagnosis not present

## 2016-10-10 DIAGNOSIS — E538 Deficiency of other specified B group vitamins: Secondary | ICD-10-CM | POA: Diagnosis not present

## 2016-10-10 DIAGNOSIS — N390 Urinary tract infection, site not specified: Secondary | ICD-10-CM | POA: Diagnosis not present

## 2016-10-10 DIAGNOSIS — Z7982 Long term (current) use of aspirin: Secondary | ICD-10-CM

## 2016-10-10 DIAGNOSIS — Z882 Allergy status to sulfonamides status: Secondary | ICD-10-CM

## 2016-10-10 DIAGNOSIS — Z96642 Presence of left artificial hip joint: Secondary | ICD-10-CM | POA: Diagnosis not present

## 2016-10-10 DIAGNOSIS — N200 Calculus of kidney: Secondary | ICD-10-CM | POA: Diagnosis not present

## 2016-10-10 DIAGNOSIS — M109 Gout, unspecified: Secondary | ICD-10-CM | POA: Diagnosis present

## 2016-10-10 DIAGNOSIS — M797 Fibromyalgia: Secondary | ICD-10-CM | POA: Diagnosis not present

## 2016-10-10 DIAGNOSIS — N202 Calculus of kidney with calculus of ureter: Secondary | ICD-10-CM | POA: Diagnosis present

## 2016-10-10 DIAGNOSIS — E7800 Pure hypercholesterolemia, unspecified: Secondary | ICD-10-CM | POA: Diagnosis not present

## 2016-10-10 DIAGNOSIS — G35 Multiple sclerosis: Secondary | ICD-10-CM | POA: Diagnosis not present

## 2016-10-10 DIAGNOSIS — J45909 Unspecified asthma, uncomplicated: Secondary | ICD-10-CM | POA: Diagnosis present

## 2016-10-10 DIAGNOSIS — A419 Sepsis, unspecified organism: Secondary | ICD-10-CM | POA: Diagnosis not present

## 2016-10-10 DIAGNOSIS — Z9841 Cataract extraction status, right eye: Secondary | ICD-10-CM | POA: Diagnosis not present

## 2016-10-10 DIAGNOSIS — Z79899 Other long term (current) drug therapy: Secondary | ICD-10-CM

## 2016-10-10 DIAGNOSIS — Z961 Presence of intraocular lens: Secondary | ICD-10-CM | POA: Diagnosis not present

## 2016-10-10 DIAGNOSIS — Z8249 Family history of ischemic heart disease and other diseases of the circulatory system: Secondary | ICD-10-CM | POA: Diagnosis not present

## 2016-10-10 DIAGNOSIS — G4730 Sleep apnea, unspecified: Secondary | ICD-10-CM | POA: Diagnosis not present

## 2016-10-10 DIAGNOSIS — Z87891 Personal history of nicotine dependence: Secondary | ICD-10-CM | POA: Diagnosis not present

## 2016-10-10 DIAGNOSIS — R109 Unspecified abdominal pain: Secondary | ICD-10-CM

## 2016-10-10 DIAGNOSIS — G8929 Other chronic pain: Secondary | ICD-10-CM | POA: Diagnosis not present

## 2016-10-10 DIAGNOSIS — Z886 Allergy status to analgesic agent status: Secondary | ICD-10-CM | POA: Diagnosis not present

## 2016-10-10 DIAGNOSIS — Z888 Allergy status to other drugs, medicaments and biological substances status: Secondary | ICD-10-CM

## 2016-10-10 DIAGNOSIS — Z885 Allergy status to narcotic agent status: Secondary | ICD-10-CM

## 2016-10-10 DIAGNOSIS — R509 Fever, unspecified: Secondary | ICD-10-CM | POA: Diagnosis not present

## 2016-10-10 DIAGNOSIS — K219 Gastro-esophageal reflux disease without esophagitis: Secondary | ICD-10-CM | POA: Diagnosis not present

## 2016-10-10 DIAGNOSIS — R51 Headache: Secondary | ICD-10-CM | POA: Diagnosis present

## 2016-10-10 DIAGNOSIS — Z9842 Cataract extraction status, left eye: Secondary | ICD-10-CM | POA: Diagnosis not present

## 2016-10-10 DIAGNOSIS — Z716 Tobacco abuse counseling: Secondary | ICD-10-CM | POA: Diagnosis not present

## 2016-10-10 DIAGNOSIS — M810 Age-related osteoporosis without current pathological fracture: Secondary | ICD-10-CM | POA: Diagnosis not present

## 2016-10-10 DIAGNOSIS — E871 Hypo-osmolality and hyponatremia: Secondary | ICD-10-CM | POA: Diagnosis not present

## 2016-10-10 DIAGNOSIS — R1032 Left lower quadrant pain: Secondary | ICD-10-CM | POA: Diagnosis not present

## 2016-10-10 DIAGNOSIS — F319 Bipolar disorder, unspecified: Secondary | ICD-10-CM | POA: Diagnosis present

## 2016-10-10 DIAGNOSIS — Z881 Allergy status to other antibiotic agents status: Secondary | ICD-10-CM

## 2016-10-10 DIAGNOSIS — R1030 Lower abdominal pain, unspecified: Secondary | ICD-10-CM | POA: Diagnosis not present

## 2016-10-10 LAB — URINALYSIS COMPLETE WITH MICROSCOPIC (ARMC ONLY)
BILIRUBIN URINE: NEGATIVE
Bacteria, UA: NONE SEEN
Glucose, UA: NEGATIVE mg/dL
Ketones, ur: NEGATIVE mg/dL
NITRITE: NEGATIVE
Protein, ur: NEGATIVE mg/dL
Specific Gravity, Urine: 1.014 (ref 1.005–1.030)
pH: 5 (ref 5.0–8.0)

## 2016-10-10 LAB — COMPREHENSIVE METABOLIC PANEL
ALBUMIN: 4.4 g/dL (ref 3.5–5.0)
ALK PHOS: 72 U/L (ref 38–126)
ALT: 11 U/L — ABNORMAL LOW (ref 14–54)
AST: 15 U/L (ref 15–41)
Anion gap: 10 (ref 5–15)
BILIRUBIN TOTAL: 0.9 mg/dL (ref 0.3–1.2)
BUN: 10 mg/dL (ref 6–20)
CALCIUM: 8.9 mg/dL (ref 8.9–10.3)
CO2: 27 mmol/L (ref 22–32)
Chloride: 97 mmol/L — ABNORMAL LOW (ref 101–111)
Creatinine, Ser: 0.95 mg/dL (ref 0.44–1.00)
GFR calc Af Amer: >60 mL/min (ref >60)
GFR calc non Af Amer: >60 mL/min (ref >60)
GLUCOSE: 95 mg/dL (ref 65–99)
Potassium: 3.7 mmol/L (ref 3.5–5.1)
Sodium: 134 mmol/L — ABNORMAL LOW (ref 135–145)
TOTAL PROTEIN: 7.5 g/dL (ref 6.5–8.1)

## 2016-10-10 LAB — CBC WITH DIFFERENTIAL/PLATELET
BASOS ABS: 0 10*3/uL (ref 0–0.1)
BASOS PCT: 0 %
EOS PCT: 1 %
Eosinophils Absolute: 0.1 10*3/uL (ref 0–0.7)
HCT: 39.4 % (ref 35.0–47.0)
Hemoglobin: 13.5 g/dL (ref 12.0–16.0)
Lymphocytes Relative: 8 %
Lymphs Abs: 1 10*3/uL (ref 1.0–3.6)
MCH: 32 pg (ref 26.0–34.0)
MCHC: 34.1 g/dL (ref 32.0–36.0)
MCV: 93.9 fL (ref 80.0–100.0)
MONO ABS: 0.6 10*3/uL (ref 0.2–0.9)
Monocytes Relative: 5 %
Neutro Abs: 11.1 10*3/uL — ABNORMAL HIGH (ref 1.4–6.5)
Neutrophils Relative %: 86 %
PLATELETS: 309 10*3/uL (ref 150–440)
RBC: 4.2 MIL/uL (ref 3.80–5.20)
RDW: 13 % (ref 11.5–14.5)
WBC: 12.9 10*3/uL — ABNORMAL HIGH (ref 3.6–11.0)

## 2016-10-10 LAB — INFLUENZA PANEL BY PCR (TYPE A & B)
INFLAPCR: NEGATIVE
Influenza B By PCR: NEGATIVE

## 2016-10-10 LAB — LACTIC ACID, PLASMA: Lactic Acid, Venous: 1.5 mmol/L (ref 0.5–1.9)

## 2016-10-10 LAB — PREGNANCY, URINE: Preg Test, Ur: NEGATIVE

## 2016-10-10 IMAGING — DX DG CHEST 1V PORT
1 series · 1 of 1 positions shown · non-contrast
Comparison: [DATE]

CLINICAL DATA: Fevers following ureteral stent removal

EXAM:
PORTABLE CHEST 1 VIEW

[chest ap]
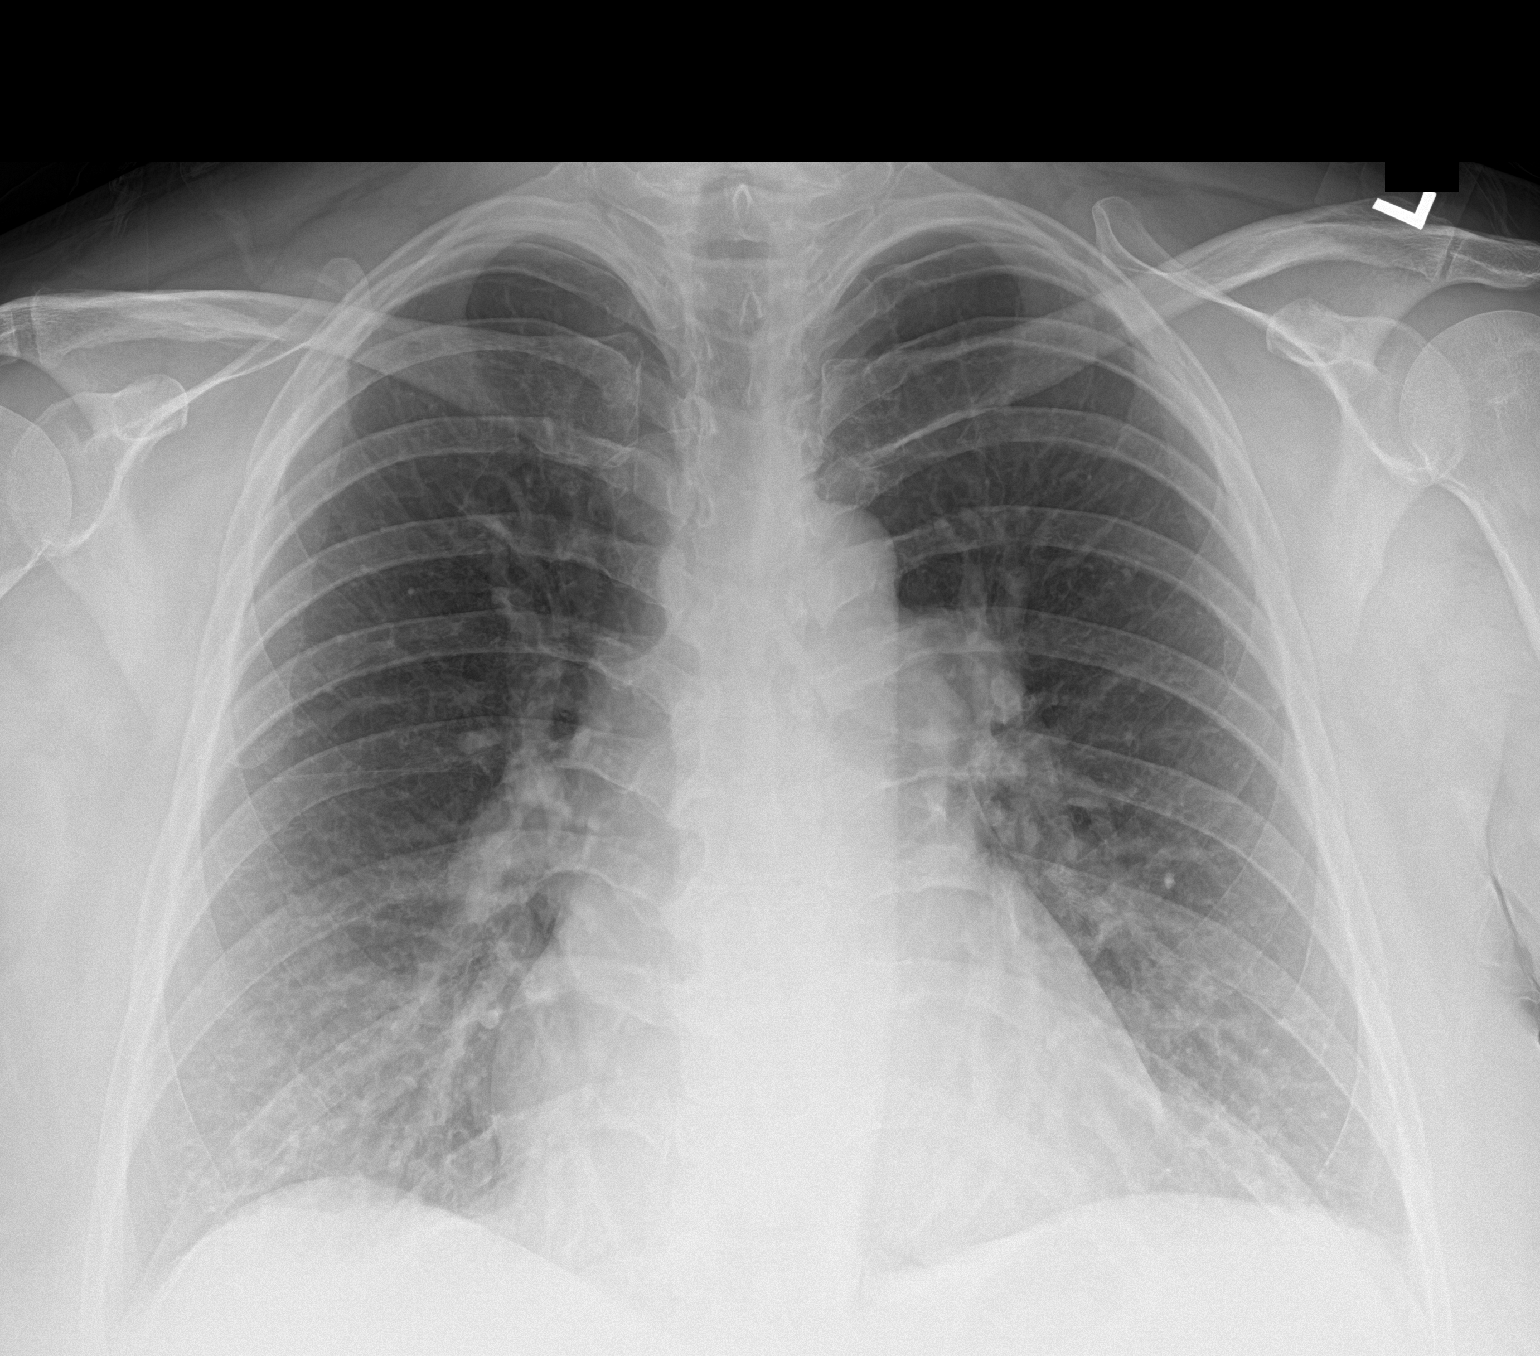

[1 of 1 positions shown; findings below may reference images not displayed]

FINDINGS: The heart size and mediastinal contours are within normal limits.
Both lungs are clear. The visualized skeletal structures are
unremarkable.
IMPRESSION: No active disease.

## 2016-10-10 IMAGING — CT CT RENAL STONE PROTOCOL
2 of 4 series · 12 of 46 positions shown, 14 images · non-contrast
Comparison: None.

CLINICAL DATA: Bilateral flank pain

EXAM:
CT ABDOMEN AND PELVIS WITHOUT CONTRAST
TECHNIQUE: Multidetector CT imaging of the abdomen and pelvis was performed
following the standard protocol without IV contrast.

[Series 2: axial st · axial · 0.64mm/px · z∈[-648,-273]mm · 9 of 91 slices shown, 11 images]
[im 8/91  soft-tissue]
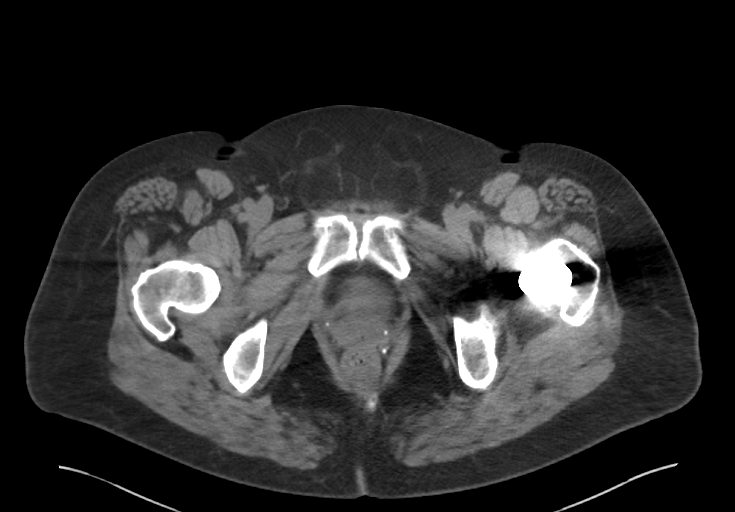
[im 8/91  bone]
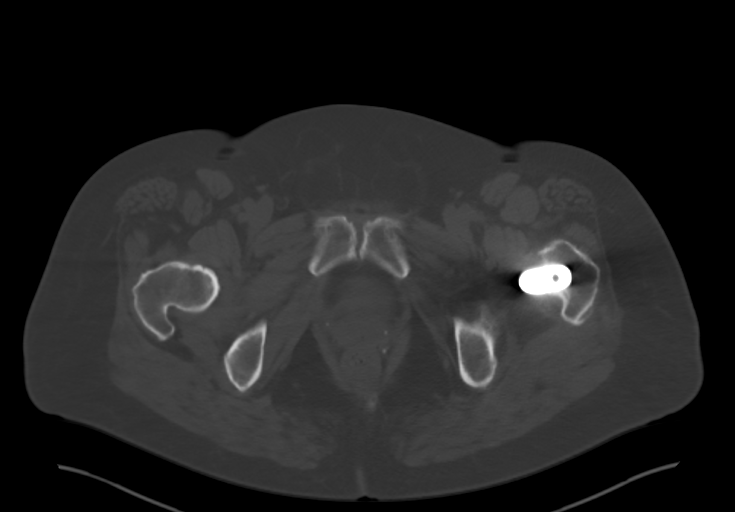
[im 16/91  soft-tissue]
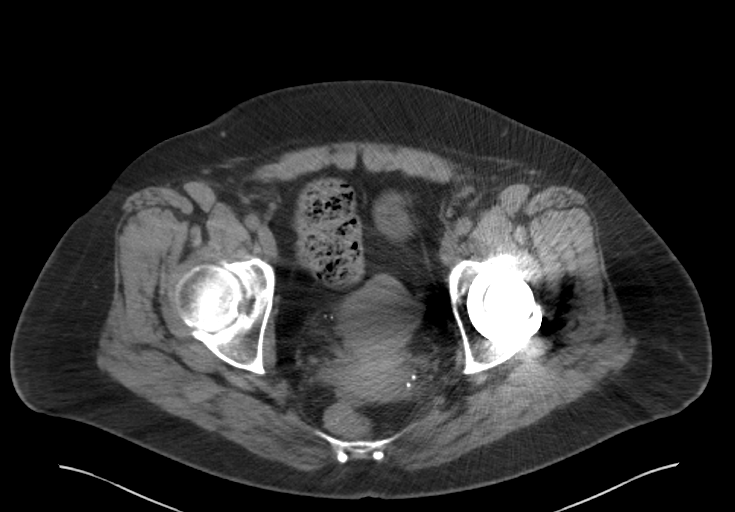
[im 27/91  soft-tissue]
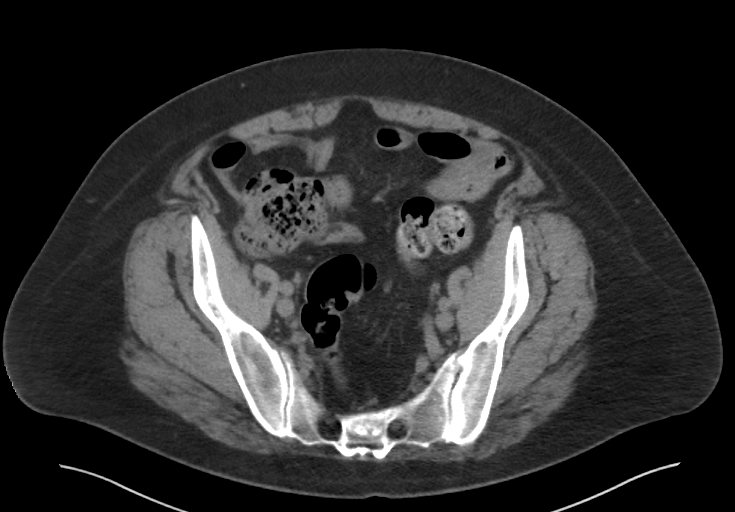
[im 34/91  soft-tissue]
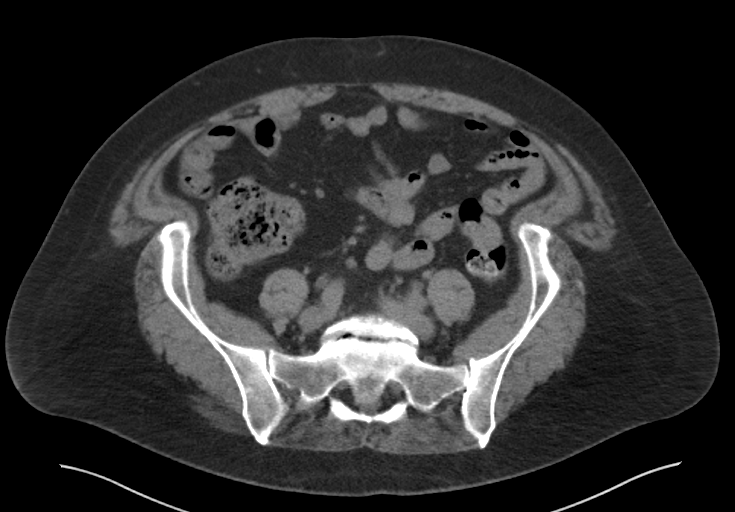
[im 46/91  soft-tissue]
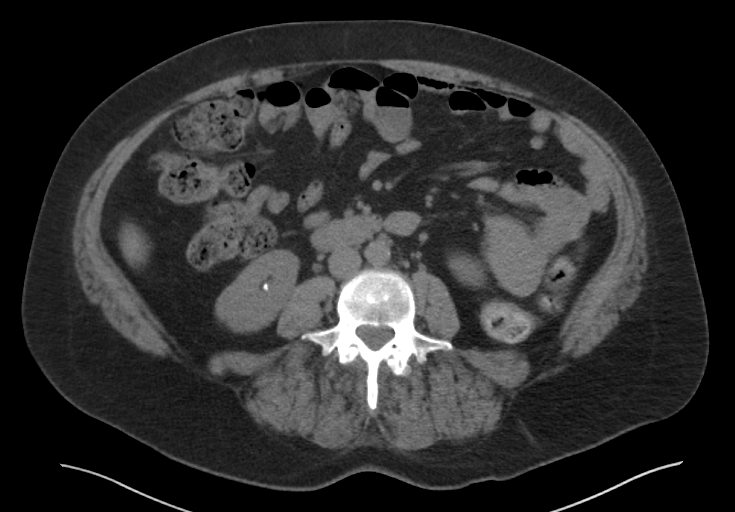
[im 57/91  soft-tissue]
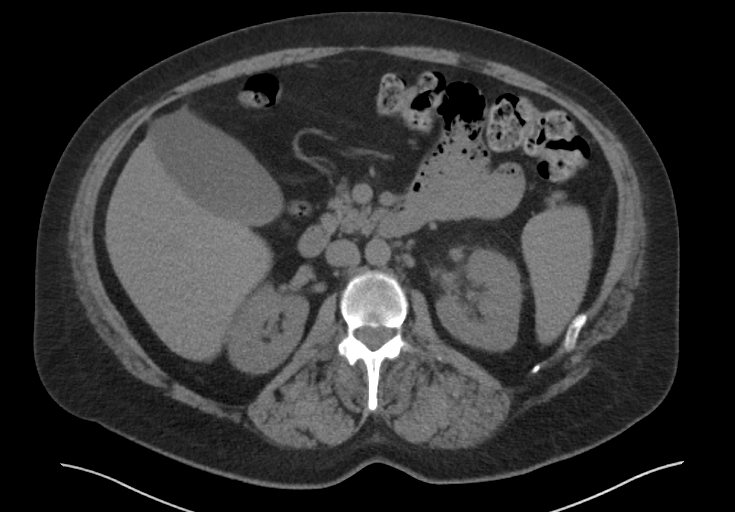
[im 64/91  soft-tissue]
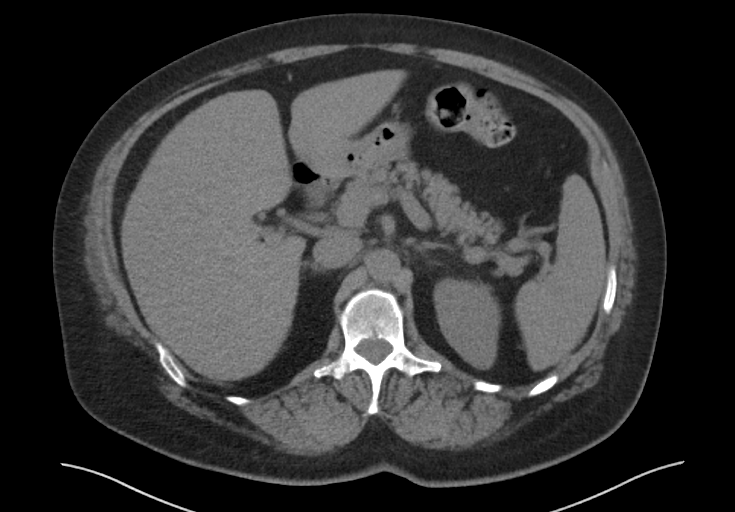
[im 76/91  soft-tissue]
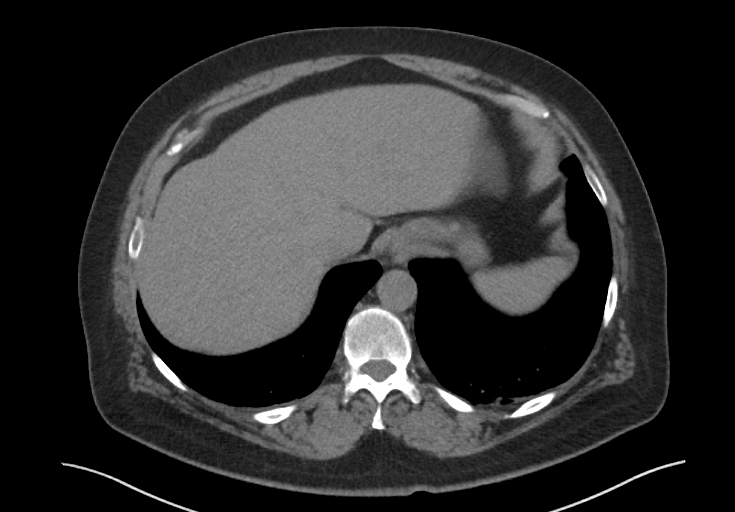
[im 83/91  soft-tissue]
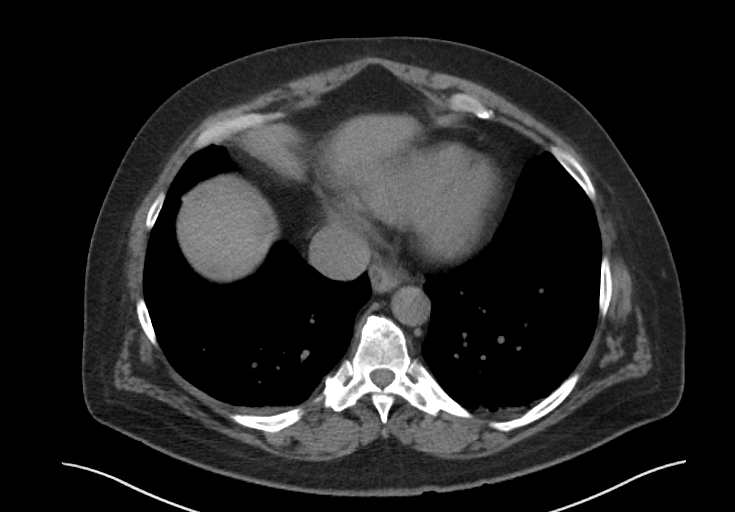
[im 83/91  bone]
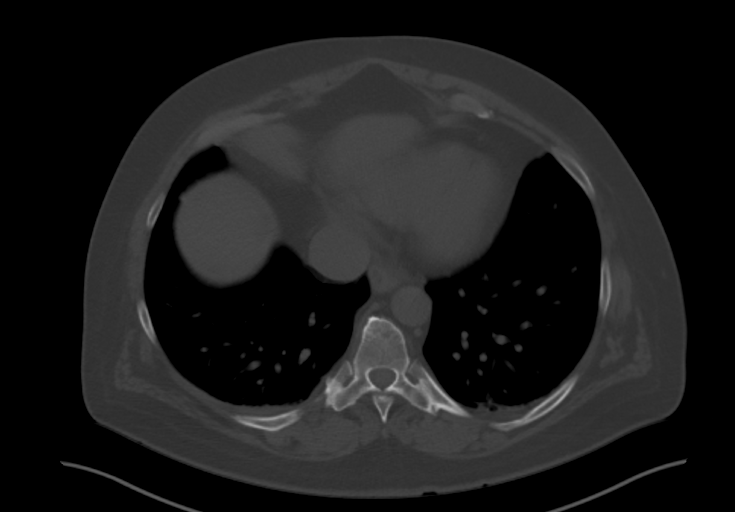

[Series 5: coronal · coronal · 0.82mm/px · 3 of 159 slices shown]
[im 53/159  soft-tissue]
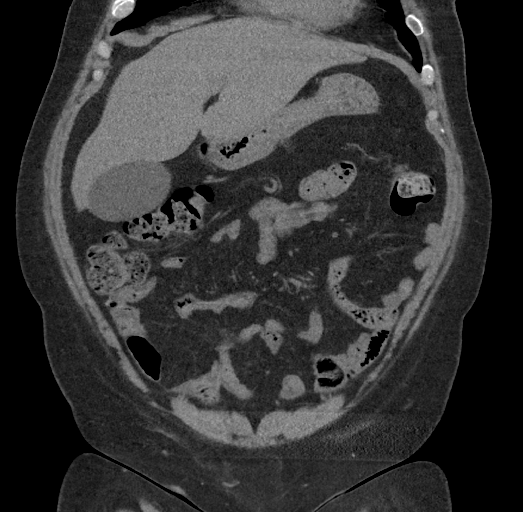
[im 71/159  soft-tissue]
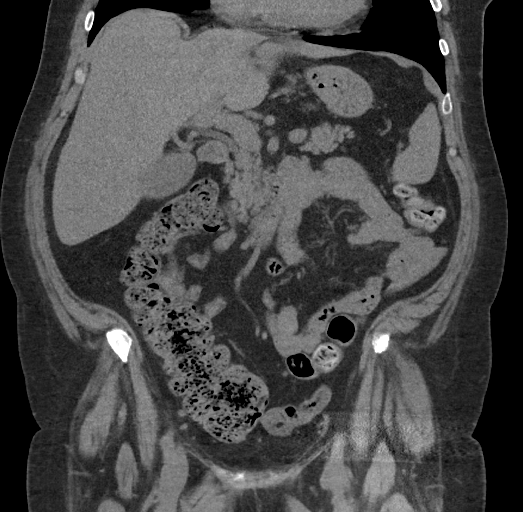
[im 88/159  soft-tissue]
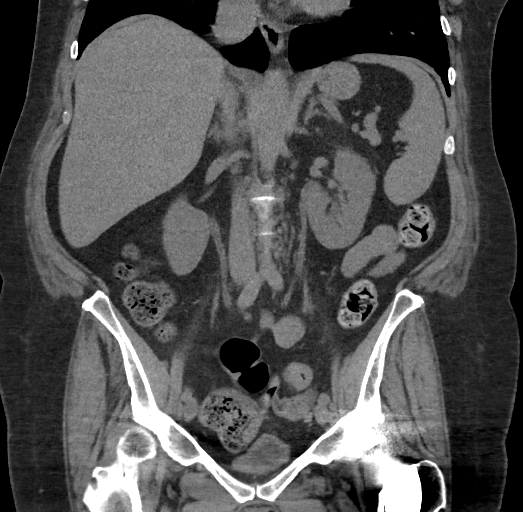

[12 of 46 positions shown; findings below may reference images not displayed]

FINDINGS: Lower chest: No acute abnormality.

Hepatobiliary: No focal liver abnormality is seen. No gallstones,
gallbladder wall thickening, or biliary dilatation.

Pancreas: Unremarkable. No pancreatic ductal dilatation or
surrounding inflammatory changes.

Spleen: Normal in size without focal abnormality.

Adrenals/Urinary Tract: The adrenal glands are within normal limits.
The left kidney shows no evidence of renal calculi. A small 3-4 mm
nonobstructing stone is noted in the lower pole of the right kidney.
No ureteral calculi are seen. The bladder is partially decompressed.

Stomach/Bowel: Stomach is within normal limits. Appendix appears
normal. No evidence of bowel wall thickening, distention, or
inflammatory changes. Scattered diverticular change is noted without
diverticulitis.

Vascular/Lymphatic: No significant vascular findings are present. No
enlarged abdominal or pelvic lymph nodes.

Reproductive: Uterus and bilateral adnexa are unremarkable.

Other: No abdominal wall hernia or abnormality. No abdominopelvic
ascites.

Musculoskeletal: Left hip replacement is noted. No acute bony
abnormality is seen. Degenerative changes of the lumbar spine are
noted. Anterolisthesis of L4 on L5 is noted of a degenerative basis.
IMPRESSION: Nonobstructing right renal stone.

No acute abnormality is identified.

## 2016-10-10 MED ORDER — ONDANSETRON HCL 4 MG PO TABS
4.0000 mg | ORAL_TABLET | Freq: Four times a day (QID) | ORAL | Status: DC | PRN
  Administered 2016-10-12: 4 mg via ORAL
  Filled 2016-10-10: qty 1

## 2016-10-10 MED ORDER — CEFTRIAXONE SODIUM-DEXTROSE 1-3.74 GM-% IV SOLR
1.0000 g | INTRAVENOUS | Status: DC
  Filled 2016-10-10: qty 50

## 2016-10-10 MED ORDER — LAMOTRIGINE 100 MG PO TABS
300.0000 mg | ORAL_TABLET | Freq: Every day | ORAL | Status: DC
  Administered 2016-10-10 – 2016-10-13 (×4): 300 mg via ORAL
  Filled 2016-10-10 (×4): qty 3

## 2016-10-10 MED ORDER — HYDROCODONE-ACETAMINOPHEN 10-325 MG PO TABS
1.0000 | ORAL_TABLET | Freq: Three times a day (TID) | ORAL | Status: DC | PRN
  Administered 2016-10-10 – 2016-10-14 (×10): 1 via ORAL
  Filled 2016-10-10 (×11): qty 1

## 2016-10-10 MED ORDER — ACETAMINOPHEN 500 MG PO TABS
ORAL_TABLET | ORAL | Status: AC
  Administered 2016-10-10: 1000 mg via ORAL
  Filled 2016-10-10: qty 2

## 2016-10-10 MED ORDER — HYDROXYZINE HCL 50 MG PO TABS
100.0000 mg | ORAL_TABLET | Freq: Every day | ORAL | Status: DC
  Administered 2016-10-10 – 2016-10-13 (×4): 100 mg via ORAL
  Filled 2016-10-10 (×4): qty 2

## 2016-10-10 MED ORDER — MIRABEGRON ER 25 MG PO TB24
25.0000 mg | ORAL_TABLET | Freq: Every day | ORAL | Status: DC
  Administered 2016-10-10 – 2016-10-13 (×4): 25 mg via ORAL
  Filled 2016-10-10 (×4): qty 1

## 2016-10-10 MED ORDER — ONDANSETRON HCL 4 MG/2ML IJ SOLN
4.0000 mg | Freq: Four times a day (QID) | INTRAMUSCULAR | Status: DC | PRN
  Administered 2016-10-10: 4 mg via INTRAVENOUS
  Filled 2016-10-10: qty 2

## 2016-10-10 MED ORDER — SODIUM CHLORIDE 0.9 % IV SOLN
INTRAVENOUS | Status: DC
  Administered 2016-10-10 – 2016-10-13 (×8): via INTRAVENOUS

## 2016-10-10 MED ORDER — CEFTRIAXONE SODIUM-DEXTROSE 1-3.74 GM-% IV SOLR
1.0000 g | Freq: Once | INTRAVENOUS | Status: AC
  Administered 2016-10-10: 1 g via INTRAVENOUS
  Filled 2016-10-10: qty 50

## 2016-10-10 MED ORDER — LEVALBUTEROL HCL 0.63 MG/3ML IN NEBU
0.6300 mg | INHALATION_SOLUTION | Freq: Four times a day (QID) | RESPIRATORY_TRACT | Status: DC | PRN
  Administered 2016-10-12: 0.63 mg via RESPIRATORY_TRACT
  Filled 2016-10-10: qty 3

## 2016-10-10 MED ORDER — TAMSULOSIN HCL 0.4 MG PO CAPS
0.4000 mg | ORAL_CAPSULE | Freq: Every evening | ORAL | Status: DC
  Administered 2016-10-10 – 2016-10-13 (×4): 0.4 mg via ORAL
  Filled 2016-10-10 (×4): qty 1

## 2016-10-10 MED ORDER — BIOTIN 5000 MCG PO TABS: 10000.0000 ug | ORAL_TABLET | Freq: Every evening | ORAL | Status: DC

## 2016-10-10 MED ORDER — CALCIPOTRIENE-BETAMETH DIPROP 0.005-0.064 % EX OINT: 1.0000 "application " | TOPICAL_OINTMENT | Freq: Every day | CUTANEOUS | Status: DC | PRN

## 2016-10-10 MED ORDER — TEMAZEPAM 15 MG PO CAPS
30.0000 mg | ORAL_CAPSULE | Freq: Every evening | ORAL | Status: DC | PRN
  Administered 2016-10-10 – 2016-10-11 (×2): 30 mg via ORAL
  Filled 2016-10-10 (×2): qty 2

## 2016-10-10 MED ORDER — SODIUM CHLORIDE 0.9% FLUSH
3.0000 mL | Freq: Two times a day (BID) | INTRAVENOUS | Status: DC
  Administered 2016-10-10 – 2016-10-14 (×4): 3 mL via INTRAVENOUS

## 2016-10-10 MED ORDER — DULOXETINE HCL 20 MG PO CPEP
40.0000 mg | ORAL_CAPSULE | Freq: Every day | ORAL | Status: DC
  Administered 2016-10-10 – 2016-10-13 (×4): 40 mg via ORAL
  Filled 2016-10-10 (×4): qty 2

## 2016-10-10 MED ORDER — HEPARIN SODIUM (PORCINE) 5000 UNIT/ML IJ SOLN
5000.0000 [IU] | Freq: Three times a day (TID) | INTRAMUSCULAR | Status: DC
  Administered 2016-10-10 – 2016-10-14 (×10): 5000 [IU] via SUBCUTANEOUS
  Filled 2016-10-10 (×10): qty 1

## 2016-10-10 MED ORDER — CYANOCOBALAMIN 1000 MCG/ML IJ SOLN: 1000.0000 ug | INTRAMUSCULAR | Status: DC

## 2016-10-10 MED ORDER — TIZANIDINE HCL 4 MG PO TABS
8.0000 mg | ORAL_TABLET | Freq: Three times a day (TID) | ORAL | Status: DC | PRN
  Administered 2016-10-10 – 2016-10-14 (×11): 8 mg via ORAL
  Filled 2016-10-10 (×11): qty 2

## 2016-10-10 MED ORDER — NICOTINE 21 MG/24HR TD PT24
21.0000 mg | MEDICATED_PATCH | Freq: Every day | TRANSDERMAL | Status: DC
  Administered 2016-10-10 – 2016-10-13 (×3): 21 mg via TRANSDERMAL
  Filled 2016-10-10 (×4): qty 1

## 2016-10-10 MED ORDER — ASPIRIN 325 MG PO TABS
325.0000 mg | ORAL_TABLET | ORAL | Status: DC | PRN
  Administered 2016-10-10 – 2016-10-11 (×3): 325 mg via ORAL
  Filled 2016-10-10 (×3): qty 1

## 2016-10-10 MED ORDER — SODIUM CHLORIDE 0.9 % IV SOLN
1000.0000 mL | Freq: Once | INTRAVENOUS | Status: AC
  Administered 2016-10-10: 1000 mL via INTRAVENOUS

## 2016-10-10 MED ORDER — CYANOCOBALAMIN 1000 MCG/ML IJ SOLN
1000.0000 ug | INTRAMUSCULAR | Status: DC
Start: 2016-10-10 — End: 2016-10-10

## 2016-10-10 MED ORDER — DEXTROSE 5 % IV SOLN: 1.0000 g | INTRAVENOUS | Status: DC

## 2016-10-10 MED ORDER — ZOLPIDEM TARTRATE 5 MG PO TABS
5.0000 mg | ORAL_TABLET | Freq: Every day | ORAL | Status: DC
  Administered 2016-10-10 – 2016-10-13 (×4): 5 mg via ORAL
  Filled 2016-10-10 (×4): qty 1

## 2016-10-10 MED ORDER — ACETAMINOPHEN 500 MG PO TABS
1000.0000 mg | ORAL_TABLET | Freq: Once | ORAL | Status: AC
  Administered 2016-10-10: 1000 mg via ORAL

## 2016-10-10 MED ORDER — LYSINE 1000 MG PO TABS: 2000.0000 mg | ORAL_TABLET | Freq: Every day | ORAL | Status: DC

## 2016-10-10 MED ORDER — DEXTROSE 5 % IV SOLN: 1.0000 g | Freq: Once | INTRAVENOUS | Status: DC

## 2016-10-10 MED ORDER — DOCUSATE SODIUM 100 MG PO CAPS
100.0000 mg | ORAL_CAPSULE | Freq: Two times a day (BID) | ORAL | Status: DC
Start: 2016-10-10 — End: 2016-10-14
  Administered 2016-10-10 – 2016-10-13 (×7): 100 mg via ORAL
  Filled 2016-10-10 (×8): qty 1

## 2016-10-10 MED ORDER — CARBOXYMETHYLCELLULOSE SODIUM 0.5 % OP SOLN
1.0000 [drp] | Freq: Three times a day (TID) | OPHTHALMIC | Status: DC | PRN
  Filled 2016-10-10: qty 15

## 2016-10-10 NOTE — ED Notes (Signed)
Temp decreased to 103.2 from 103.6 after PO tylenol. Pt states OTC tylenol/ibuprofen/naproxen all cause swelling to BLE. MD notified, no additional orders for antipyretics to be given at this time. Reinforced with pt that she cannot use blankets until fever is reduced.

## 2016-10-10 NOTE — ED Provider Notes (Signed)
[facility] Emergency Department Provider Note   ____________________________________________    I have reviewed the triage vital signs and the nursing notes.   HISTORY  Chief Complaint Fever     HPI Emily Moon is a 55 y.o. female with past medical history as noted below who presents with fever and "feeling ill". Patient notes she recent had ureteral stents removed 3 days ago, the next day she reports she developed chills and yesterday she felt quite ill. She denies cough or upper respiratory infection symptoms. She does have dysuria. She has back pain primarily on the left. She has a long history of kidney stones. She follows with [facility] urology   Past Medical History:  Diagnosis Date  . Arthritis    osteo  . Asthma   . Bipolar disorder ([place]) 05/21/14  . Cataracts, bilateral   . DDD (degenerative disc disease), cervical    also back  . Depression   . Dizziness    Positional  . Edema    feet/legs  . Fibromyalgia syndrome   . Fungal infection    Finger nails  . GERD (gastroesophageal reflux disease)   . Gout   . Headache    seasonal allergies  . Heart palpitations   . 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 Hypercholesterolemia   . Multiple sclerosis (HCC)    weakness  . Nephrolithiasis    kidney stones  . Osteoporosis    osteoarthritis  . Pneumonia   . PONV (postoperative nausea and vomiting)    no problem after cataract surgery  . Psoriasis   . Renal stone   . Shortness of breath dyspnea    wheezing  . Sleep apnea 2012   sleep study / slight, no interventions  . Urinary frequency     Patient Active Problem List   Diagnosis Date Noted  . B12 deficiency 05/22/2016  . Dizziness 03/16/2016  . Back pain 10/31/2014  . Bipolar disorder ([place]) 05/21/2014  . 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 Insomnia  09/01/2011  . Obesity 05/04/2011  . Perimenopausal vasomotor symptoms 02/05/2011  . HLD (hyperlipidemia) 01/20/2011  . SMOKER 01/20/2011  . Chronic pain syndrome 01/07/2011  . RENAL CALCULUS, RECURRENT 11/13/2010  . MULTIPLE SCLEROSIS, PROGRESSIVE/RELAPSING 08/28/2010  . GERD 08/28/2010  . Osteoarthritis 08/28/2010  . NEPHROLITHIASIS, HX OF 08/28/2010    Past Surgical History:  Procedure Laterality Date  . CATARACT EXTRACTION W/PHACO Left 05/21/2015   Procedure: CATARACT EXTRACTION PHACO AND INTRAOCULAR LENS PLACEMENT (IOC);  Surgeon: Birder Robson, MD;  Location: [facility] ORS;  Service: Ophthalmology;  Laterality: Left;  [place] 00:35 AP% 22.9 CDE 8.11 fluid pack lot [id number]  . CATARACT EXTRACTION W/PHACO Right 06/04/2015   Procedure: CATARACT EXTRACTION PHACO AND INTRAOCULAR LENS PLACEMENT (IOC);  Surgeon: Birder Robson, MD;  Location: [facility] ORS;  Service: Ophthalmology;  Laterality: Right;  US:00:48 AP%: 10.5 CDE:5.08 Fluid lot [id number]  . CYSTOSCOPY/URETEROSCOPY/HOLMIUM LASER/STENT PLACEMENT Bilateral 09/22/2016   Procedure: CYSTOSCOPY/URETEROSCOPY/HOLMIUM LASER/STENT PLACEMENT;  Surgeon: Hollice Espy, MD;  Location: [facility] ORS;  Service: Urology;  Laterality: Bilateral;  . EYE SURGERY  2015   tissue biopsy  . FOOT SURGERY  2015  . JOINT REPLACEMENT Left 2013   hip replacement  . LITHOTRIPSY    . PTOSIS REPAIR Bilateral 02/18/2016   Procedure: BILATERAL PTOSIS REPAIR UPPER EYELIDS;  Surgeon: Karle Starch, MD;  Location: [place];  Service: Ophthalmology;  Laterality: Bilateral;  LEAVE PT EARLY AM  . thumb surgery Right   . TONSILLECTOMY  1973    Prior to Admission medications   Medication Sig Start Date End Date Taking? Authorizing Provider  ASPERCREME LIDOCAINE EX Apply 1 application topically 4 (four) times daily as needed (for pain.).     Historical Provider, MD  aspirin 325 MG tablet Take 325 mg by mouth every 4 (four) hours as needed for headache.     Historical  Provider, MD  Biotin 5000 MCG TABS Take 10,000 mcg by mouth every evening.    Historical Provider, MD  calcipotriene-betamethasone (TACLONEX) ointment Apply 1 application topically daily as needed (for psorasis).     Historical Provider, MD  carboxymethylcellulose (REFRESH TEARS) 0.5 % SOLN Place 1-2 drops into both eyes 3 (three) times daily as needed.    Historical Provider, MD  Cholecalciferol (VITAMIN D3) 5000 units TABS Take 10,000 Units by mouth at bedtime.    Historical Provider, MD  clobetasol cream (TEMOVATE) [id number] % Apply 1 application topically 2 (two) times daily. Patient taking differently: Apply 1 application topically 2 (two) times daily as needed (for irriation).  03/11/15   Owens Loffler, MD  cyanocobalamin (,VITAMIN B-12,) 1000 MCG/ML injection Inject 1,000 mcg into the muscle every 8 (eight) weeks. Every other month    Historical Provider, MD  diclofenac sodium (VOLTAREN) 1 % GEL Apply 4 g topically 4 (four) times daily. Patient taking differently: Apply 4 g topically 4 (four) times daily as needed (for pain.).  11/15/14   Jearld Fenton, NP  docusate sodium (COLACE) 100 MG capsule Take 1 capsule (100 mg total) by mouth 2 (two) times daily. 09/22/16   Hollice Espy, MD  DULoxetine (CYMBALTA) 20 MG capsule Take 40 mg by mouth at bedtime.  05/28/15   Historical Provider, MD  fluticasone (FLONASE) 50 MCG/ACT nasal spray Place 2 sprays into both nostrils daily.     Historical Provider, MD  HYDROcodone-acetaminophen (NORCO) 10-325 MG tablet Take 1 tablet by mouth every 8 (eight) hours as needed for severe pain. 10/06/16   [name], NP  HYDROcodone-acetaminophen (NORCO) 10-325 MG tablet Take 1 tablet by mouth every 8 (eight) hours as needed for severe pain. 10/06/16   [name], NP  HYDROcodone-acetaminophen (NORCO) 10-325 MG tablet Take 1 tablet by mouth every 6 (six) hours as needed for severe pain. 10/06/16   [name], NP  hydroquinone 4 % cream 1 APPLICATION  TOPICALLY DAILY AS NEEDED FOR BLEMISHES. 05/16/15   Historical Provider, MD  hydrOXYzine (VISTARIL) 50 MG capsule Take 100 mg by mouth at bedtime.     Historical Provider, MD  ketorolac (TORADOL) 10 MG tablet Take 1 tablet (10 mg total) by mouth every 6 (six) hours as needed. 09/25/16   [name], NP  lamoTRIgine (LAMICTAL) 150 MG tablet Take two tablets by mouth daily Patient taking differently: Take 300 mg by mouth at bedtime.  10/10/13   Jearld Fenton, NP  levalbuterol [facility] HFA) 45 MCG/ACT inhaler Inhale 1-2 puffs into the lungs every 6 (six) hours as needed for wheezing. 11/12/15   [name], NP  Lysine 1000 MG TABS Take 2,000-4,000 mg by mouth at bedtime. 2000 mg scheduled at bedtime and patient will take 4000 mg if she has outbreak    Management consultant, MD  mirabegron ER (MYRBETRIQ) 25 MG TB24 tablet TAKE ONE TABLET BY MOUTH ONE TIME DAILY AT BEDTIME 08/21/16   Historical Provider, MD  neomycin-bacitracin-polymyxin (NEOSPORIN)  [phone] ointment Apply 1 application topically 4 (four) times daily as needed (for cut/scrapes.).    Historical Provider, MD  nicotine (NICODERM CQ - DOSED IN MG/24 HOURS) 21 mg/24hr patch Place 1 patch (21 mg total) onto the skin daily. 02/20/16   [name], NP  omeprazole (PRILOSEC) 20 MG capsule Take 20 mg by mouth daily as needed (for acid reflux.).     Historical Provider, MD  ondansetron (ZOFRAN ODT) 4 MG disintegrating tablet Take 1 tablet (4 mg total) by mouth every 8 (eight) hours as needed for nausea. 09/07/16   Larene Pickett, PA-C  oxybutynin (DITROPAN) 5 MG tablet Take 1 tablet (5 mg total) by mouth every 8 (eight) hours as needed for bladder spasms. 09/22/16   Hollice Espy, MD  oxyCODONE (ROXICODONE) 5 MG immediate release tablet Take 1 tablet (5 mg total) by mouth every 4 (four) hours as needed for severe pain. 09/16/16   Nori Riis, PA-C  Phenazopyridine HCl (AZO TABS PO) Take 2 tablets by mouth 3 (three) times daily as  needed (for urinary discomfort.).     Historical Provider, MD  polyethylene glycol powder (GLYCOLAX/MIRALAX) powder MIX 17 GRAMS (1 CAPFUL) WITH 4-8 OZ OF LIQUID AND TAKE BY MOUTH TWICE DAILY AS NEEDED Patient taking differently: MIX 17 GRAMS (1 CAPFUL) WITH 4-8 OZ OF LIQUID AND TAKE BY MOUTH TWICE DAILY AS NEEDED FOR CONSTIPATION 08/24/16   [name], NP  ranitidine (ZANTAC) 150 MG tablet Take 150 mg by mouth 2 (two) times daily as needed for heartburn. Reported on 02/12/2016    Historical Provider, MD  tamsulosin (FLOMAX) 0.4 MG CAPS capsule Take 1 capsule (0.4 mg total) by mouth 2 (two) times daily. Patient taking differently: Take 0.4 mg by mouth at bedtime.  09/07/16   Larene Pickett, PA-C  tamsulosin (FLOMAX) 0.4 MG CAPS capsule TAKE 1 CAPSULE (0.4 MG TOTAL) BY MOUTH DAILY. 10/10/16   Larene Beach A McGowan, PA-C  temazepam (RESTORIL) 30 MG capsule TAKE ONE TO TWO CAPSULES BY MOUTH NIGHTLY AT BEDTIME AS NEEDED  Patient taking differently: TAKE ONE TO TWO CAPSULES BY MOUTH NIGHTLY AT BEDTIME 07/05/14   Lucille Passy, MD  tiZANidine (ZANAFLEX) 4 MG tablet TAKE 2 TABLETS (8 MG TOTAL) BY MOUTH 3 (THREE) TIMES DAILY. Patient taking differently: TAKE 2 TABLETS (8 MG TOTAL) BY MOUTH 3 (THREE) TIMES DAILY AS NEEDED FOR MUSCLE SPASMS (SCHEDULED AT BEDTIME) 05/29/16   [name], NP  zolpidem (AMBIEN) 10 MG tablet Take 10 mg by mouth at bedtime.     Historical Provider, MD     Allergies Albuterol; Halcion [triazolam]; Levaquin [levofloxacin in d5w]; Naproxen sodium; Tylenol [acetaminophen]; Cefaclor; Diclofenac sodium; Sulfa antibiotics; Tramadol; Aripiprazole; and Ibuprofen  Family History  Problem Relation Age of Onset  . Cancer Father     Abdomen with mastasis  . Cancer Mother   . Heart disease Mother   . Kidney disease Neg Hx   . Bladder Cancer Neg Hx   . Prostate cancer Neg Hx     Social History Social History  Substance Use Topics  . Smoking status: Former Smoker    Packs/day:  0.50    Years: 25.00    Types: Cigarettes    Quit date: 11/16/2012  . Smokeless tobacco: Never Used     Comment: recenty 04/26/2014  . Alcohol use No    Review of Systems  Constitutional: Positive fever   Cardiovascular: Denies chest pain. Respiratory: Denies shortness of breath. Gastrointestinal:Left flank pain  intermittent Genitourinary: Mild dysuria Musculoskeletal: As above Skin: Negative for rash. Neurological: Negative for headaches  10-point ROS otherwise negative.  ____________________________________________   PHYSICAL EXAM:  VITAL SIGNS: ED Triage Vitals  Enc Vitals Group     BP 10/10/16 1134 126/64     Pulse Rate 10/10/16 1134 (!) 106     Resp 10/10/16 1134 18     Temp 10/10/16 1134 (!) 102.2 F (39 C)     Temp src --      SpO2 10/10/16 1134 96 %     Weight 10/10/16 1135 208 lb (94.3 kg)     Height 10/10/16 1135 5\' 9"  (1.753 m)     Head Circumference --      Peak Flow --      Pain Score --      Pain Loc --      Pain Edu? --      Excl. in [place]? --     Constitutional: Alert and oriented. No acute distress. Pleasant and interactive Eyes: Conjunctivae are normal.   Nose: No congestion/rhinnorhea. Mouth/Throat: Mucous membranes are moist.    Cardiovascular: Tachycardia, regular rhythm. Grossly normal heart sounds.  Good peripheral circulation. Respiratory: Normal respiratory effort.  No retractions. Lungs CTAB. Gastrointestinal: Soft and nontender. No distention.  Bilateral CVA tenderness Genitourinary: deferred Musculoskeletal:  Warm and well perfused Neurologic:  Normal speech and language. No gross focal neurologic deficits are appreciated.  Skin:  Skin is warm, dry and intact. No rash noted. Psychiatric: Mood and affect are normal. Speech and behavior are normal.  ____________________________________________   LABS (all labs ordered are listed, but only abnormal results are displayed)  Labs Reviewed  COMPREHENSIVE METABOLIC PANEL - Abnormal;  Notable for the following:       Result Value   Sodium 134 (*)    Chloride 97 (*)    ALT 11 (*)    All other components within normal limits  CBC WITH DIFFERENTIAL/PLATELET - Abnormal; Notable for the following:    WBC 12.9 (*)    Neutro Abs 11.1 (*)    All other components within normal limits  URINALYSIS COMPLETEWITH MICROSCOPIC ([facility] ONLY) - Abnormal; Notable for the following:    Color, Urine YELLOW (*)    APPearance CLEAR (*)    Hgb urine dipstick 2+ (*)    Leukocytes, UA TRACE (*)    Squamous Epithelial / LPF 0-5 (*)    All other components within normal limits  CULTURE, BLOOD (ROUTINE X 2)  CULTURE, BLOOD (ROUTINE X 2)  URINE CULTURE  LACTIC ACID, PLASMA  PREGNANCY, URINE  INFLUENZA PANEL BY PCR (TYPE A & B, H1N1)   ____________________________________________  EKG  None ____________________________________________  RADIOLOGY  CT scan does not demonstrate obstruction ____________________________________________   PROCEDURES  Procedure(s) performed: No    Critical Care performed: No ____________________________________________   INITIAL IMPRESSION / ASSESSMENT AND PLAN / ED COURSE  Pertinent labs & imaging results that were available during my care of the patient were reviewed by me and considered in my medical decision making (see chart for details).  Patient presents with fever, tachycardia. Given recent instrumentation of the ureters strong suspicion for possible infection/sepsis. Code sepsis called. We'll also obtain CT renal stone study given her history of repeated kidney stones to rule out infected kidney stone.  Clinical Course   CT scan does not demonstrate obstruction or ureteral stone, discussed with Dr. Junious Silk of urology he reviewed CT scan no procedural intervention recommended at this time. Agrees with  abx and admission. Will admit to medicine. ____________________________________________   FINAL CLINICAL IMPRESSION(S) / ED  DIAGNOSES  Final diagnoses:  Flank pain, acute  Sepsis, due to unspecified organism [facility])      NEW MEDICATIONS STARTED DURING THIS VISIT:  New Prescriptions   No medications on file     Note:  This document was prepared using Dragon voice recognition software and may include unintentional dictation errors.    Lavonia Drafts, MD 10/10/16 1600

## 2016-10-10 NOTE — Consult Note (Addendum)
Consult: fever Requested by: Dr. Corky Downs   History of Present Illness: 55 yo female s/p bilateral URS, stone extraction and stents Nov 7. The stents were removed in office three days ago, 11/22. She was covered with a Cipro. She has not been on abx as her UA at that time showed no bacteria. Pt felt feverish and weak starting 11/23 (two days ago). Last night had a 102 fever. She also had neck pain and leg weakness. She says she has cervical DDD and MS and she gets these symptoms with fever. No trouble voiding. No dysuria. No flank pain. Pt said, "I just came in because she told me too" (if she had fever). She doesn't feel like she's passing any stone. Her UA today showed 0-5 rbc's, 6-30 wbc and no bacteria. CT was done a few hours after pt arrived at ED (after iv abx and bolus). There was no hydro or ureteral stone fragment. She's felt nauseated but not had emesis.  She has some baseline frequency and urgency which is not new. She take oxybutynin prn.    Past Medical History:  Diagnosis Date  . Arthritis    osteo  . Asthma   . Bipolar disorder ([place]) 05/21/14  . Cataracts, bilateral   . DDD (degenerative disc disease), cervical    also back  . Depression   . Dizziness    Positional  . Edema    feet/legs  . Fibromyalgia syndrome   . Fungal infection    Finger nails  . GERD (gastroesophageal reflux disease)   . Gout   . Headache    seasonal allergies  . Heart palpitations   . 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 Hypercholesterolemia   . Multiple sclerosis (HCC)    weakness  . Nephrolithiasis    kidney stones  . Osteoporosis    osteoarthritis  . Pneumonia   . PONV (postoperative nausea and vomiting)    no problem after cataract surgery  . Psoriasis   . Renal stone   . Shortness of breath dyspnea    wheezing  . Sleep apnea 2012   sleep study / slight, no interventions  . Urinary frequency    Past Surgical History:  Procedure Laterality Date  . CATARACT EXTRACTION W/PHACO  Left 05/21/2015   Procedure: CATARACT EXTRACTION PHACO AND INTRAOCULAR LENS PLACEMENT (IOC);  Surgeon: Birder Robson, MD;  Location: [facility] ORS;  Service: Ophthalmology;  Laterality: Left;  [place] 00:35 AP% 22.9 CDE 8.11 fluid pack lot [id number]  . CATARACT EXTRACTION W/PHACO Right 06/04/2015   Procedure: CATARACT EXTRACTION PHACO AND INTRAOCULAR LENS PLACEMENT (IOC);  Surgeon: Birder Robson, MD;  Location: [facility] ORS;  Service: Ophthalmology;  Laterality: Right;  US:00:48 AP%: 10.5 CDE:5.08 Fluid lot [id number]  . CYSTOSCOPY/URETEROSCOPY/HOLMIUM LASER/STENT PLACEMENT Bilateral 09/22/2016   Procedure: CYSTOSCOPY/URETEROSCOPY/HOLMIUM LASER/STENT PLACEMENT;  Surgeon: Hollice Espy, MD;  Location: [facility] ORS;  Service: Urology;  Laterality: Bilateral;  . EYE SURGERY  2015   tissue biopsy  . FOOT SURGERY  2015  . JOINT REPLACEMENT Left 2013   hip replacement  . LITHOTRIPSY    . PTOSIS REPAIR Bilateral 02/18/2016   Procedure: BILATERAL PTOSIS REPAIR UPPER EYELIDS;  Surgeon: Karle Starch, MD;  Location: [place];  Service: Ophthalmology;  Laterality: Bilateral;  LEAVE PT EARLY AM  . thumb surgery Right   . TONSILLECTOMY  1973    Home Medications:   (Not in a hospital admission) Allergies:  Allergies  Allergen Reactions  . Albuterol Shortness Of Breath and Other (See  Comments)    Makes pt feel jittery/ tacycardic  . Halcion [Triazolam] Other (See Comments)    Dizziness,headaches,bladder problems  . Levaquin [Levofloxacin In D5w] Diarrhea and Itching    Shoulder pain  . Naproxen Sodium Swelling    Patient tolerates in small doses  . Tylenol [Acetaminophen] Swelling    Patient tolerates in small doses  . Cefaclor Other (See Comments)    Doesn't remember---unsure if actually allergic   . Diclofenac Sodium Other (See Comments)    "made very sick"  . Sulfa Antibiotics Itching    Unsure of reaction possibly itching  . Tramadol Itching and Nausea And Vomiting  . Aripiprazole Other  (See Comments)    Muscle tension/cramping  . Ibuprofen Swelling    Patient tolerates in small doses    Family History  Problem Relation Age of Onset  . Cancer Father     Abdomen with mastasis  . Cancer Mother   . Heart disease Mother   . Kidney disease Neg Hx   . Bladder Cancer Neg Hx   . Prostate cancer Neg Hx    Social History:  reports that she quit smoking about 3 years ago. Her smoking use included Cigarettes. She has a 12.50 pack-year smoking history. She has never used smokeless tobacco. She reports that she does not drink alcohol or use drugs.  ROS: A complete review of systems was performed.  All systems are negative except for pertinent findings as noted. Review of Systems  All other systems reviewed and are negative.    Physical Exam:  Vital signs in last 24 hours: Temp:  [102.2 F (39 C)-103.6 F (39.8 C)] 103.2 F (39.6 C) (11/25 1610) Pulse Rate:  [104-120] 110 (11/25 1654) Resp:  [16-20] 16 (11/25 1647) BP: (109-148)/(55-79) 130/66 (11/25 1647) SpO2:  [92 %-98 %] 94 % (11/25 1654) Weight:  [94.3 kg (208 lb)] 94.3 kg (208 lb) (11/25 1135) General:  Alert and oriented, No acute distress - sitting in bed drinking from a water bottle  HEENT: Normocephalic, atraumatic Cardiovascular: Regular rate and rhythm Lungs: Regular rate and effort Abdomen: Soft, nontender, nondistended, no abdominal masses Back: No CVA tenderness Extremities: No edema Neurologic: Grossly intact  Laboratory Data:  Results for orders placed or performed during the hospital encounter of 10/10/16 (from the past 24 hour(s))  Lactic acid, plasma     Status: None   Collection Time: 10/10/16 11:54 AM  Result Value Ref Range   Lactic Acid, Venous 1.5 0.5 - 1.9 mmol/L  Comprehensive metabolic panel     Status: Abnormal   Collection Time: 10/10/16 11:54 AM  Result Value Ref Range   Sodium 134 (L) 135 - 145 mmol/L   Potassium 3.7 3.5 - 5.1 mmol/L   Chloride 97 (L) 101 - 111 mmol/L   CO2  27 22 - 32 mmol/L   Glucose, Bld 95 65 - 99 mg/dL   BUN 10 6 - 20 mg/dL   Creatinine, Ser 0.95 0.44 - 1.00 mg/dL   Calcium 8.9 8.9 - 10.3 mg/dL   Total Protein 7.5 6.5 - 8.1 g/dL   Albumin 4.4 3.5 - 5.0 g/dL   AST 15 15 - 41 U/L   ALT 11 (L) 14 - 54 U/L   Alkaline Phosphatase 72 38 - 126 U/L   Total Bilirubin 0.9 0.3 - 1.2 mg/dL   GFR calc non Af Amer >60 >60 mL/min   GFR calc Af Amer >60 >60 mL/min   Anion gap 10 5 - 15  CBC WITH DIFFERENTIAL     Status: Abnormal   Collection Time: 10/10/16 11:54 AM  Result Value Ref Range   WBC 12.9 (H) 3.6 - 11.0 K/uL   RBC 4.20 3.80 - 5.20 MIL/uL   Hemoglobin 13.5 12.0 - 16.0 g/dL   HCT 39.4 35.0 - 47.0 %   MCV 93.9 80.0 - 100.0 fL   MCH 32.0 26.0 - 34.0 pg   MCHC 34.1 32.0 - 36.0 g/dL   RDW 13.0 11.5 - 14.5 %   Platelets 309 150 - 440 K/uL   Neutrophils Relative % 86 %   Neutro Abs 11.1 (H) 1.4 - 6.5 K/uL   Lymphocytes Relative 8 %   Lymphs Abs 1.0 1.0 - 3.6 K/uL   Monocytes Relative 5 %   Monocytes Absolute 0.6 0.2 - 0.9 K/uL   Eosinophils Relative 1 %   Eosinophils Absolute 0.1 0 - 0.7 K/uL   Basophils Relative 0 %   Basophils Absolute 0.0 0 - 0.1 K/uL  Urinalysis complete, with microscopic ([facility] only)     Status: Abnormal   Collection Time: 10/10/16 11:54 AM  Result Value Ref Range   Color, Urine YELLOW (A) YELLOW   APPearance CLEAR (A) CLEAR   Glucose, UA NEGATIVE NEGATIVE mg/dL   Bilirubin Urine NEGATIVE NEGATIVE   Ketones, ur NEGATIVE NEGATIVE mg/dL   Specific Gravity, Urine 1.014 1.005 - 1.030   Hgb urine dipstick 2+ (A) NEGATIVE   pH 5.0 5.0 - 8.0   Protein, ur NEGATIVE NEGATIVE mg/dL   Nitrite NEGATIVE NEGATIVE   Leukocytes, UA TRACE (A) NEGATIVE   RBC / HPF 0-5 0 - 5 RBC/hpf   WBC, UA 6-30 0 - 5 WBC/hpf   Bacteria, UA NONE SEEN NONE SEEN   Squamous Epithelial / LPF 0-5 (A) NONE SEEN   Mucous PRESENT   Pregnancy, urine     Status: None   Collection Time: 10/10/16 11:54 AM  Result Value Ref Range   Preg Test, Ur  NEGATIVE NEGATIVE  Influenza panel by PCR (type A & B, H1N1)     Status: None   Collection Time: 10/10/16  2:21 PM  Result Value Ref Range   Influenza A By PCR NEGATIVE NEGATIVE   Influenza B By PCR NEGATIVE NEGATIVE   Recent Results (from the past 240 hour(s))  Microscopic Examination     Status: Abnormal   Collection Time: 10/07/16 10:39 AM  Result Value Ref Range Status   WBC, UA >30 (A) 0 - 5 /hpf Final   RBC, UA >30 (A) 0 - 2 /hpf Final   Epithelial Cells (non renal) None seen 0 - 10 /hpf Final   Bacteria, UA None seen None seen/Few Final   Creatinine:  Recent Labs  10/10/16 1154  CREATININE 0.95   CT - I reviewed CT scan from today and her pre-op CT prior to URS.   Impression/Assessment:  Fever - she looks well and is smiling and talking here in ED. One would suspect a urinary source given recent procedures and stent removal, however, urine is quite bland with no bacteria. Also, CT shows no stone fragment or hydro. Her stents were in for two weeks which would certainly dilate the ureters to some degree. Discussed with patient continued medical resucitation vs cysto, stent replacement and she does not want stents replaced. Again, she's not having flank pain nor feel like she's passing a stone. No dysuria.   Plan:  IV fluids and abx per primary - will follow.  Akia Montalban 10/10/2016, 5:18 PM

## 2016-10-10 NOTE — Progress Notes (Signed)
PHARMACIST - PHYSICIAN ORDER COMMUNICATION  CONCERNING: P&T Medication Policy on Herbal Medications  DESCRIPTION:  This patient's order for:  Lysine and Biotin  has been noted.  This product(s) is classified as an "herbal" or natural product. Due to a lack of definitive safety studies or FDA approval, nonstandard manufacturing practices, plus the potential risk of unknown drug-drug interactions while on inpatient medications, the Pharmacy and Therapeutics Committee does not permit the use of "herbal" or natural products of this type within [facility].   ACTION TAKEN: The pharmacy department is unable to verify this order at this time and your patient has been informed of this safety policy. Please reevaluate patient's clinical condition at discharge and address if the herbal or natural product(s) should be resumed at that time.  Nancy Fetter, PharmD Clinical Pharmacist 10/10/2016 6:20 PM

## 2016-10-10 NOTE — ED Triage Notes (Signed)
Reports having stents removed from kidneys on Wednesday.  C/o fever since last pm.  101 this am, pt drinking cold water at triage.

## 2016-10-10 NOTE — H&P (Addendum)
[facility] NAME: Emily Moon    MR#:  [id number]  DATE OF BIRTH:  [birth year]  DATE OF ADMISSION:  10/10/2016  PRIMARY CARE PHYSICIAN: Sheral Flow, NP   REQUESTING/REFERRING PHYSICIAN: Lavonia Drafts, MD  CHIEF COMPLAINT:   Chief Complaint  Patient presents with  . Fever    Fever and chills for 2 days HISTORY OF PRESENT ILLNESS:  Emily Moon  is a 55 y.o. female with a known history of Nephrolithiasis status post stent placement and removal, Asthma, arthritis and multiple sclerosis. She had ureteral stents removed 3 days ago but developed fever and chills 2 days ago. She denies any dysuria, hematuria but has urine frequency. She was found fever at 103.2, tachycardia and leukocytosis, treated with Rocephin IV in the ED.  PAST MEDICAL HISTORY:   Past Medical History:  Diagnosis Date  . Arthritis    osteo  . Asthma   . Bipolar disorder ([place]) 05/21/14  . Cataracts, bilateral   . DDD (degenerative disc disease), cervical    also back  . Depression   . Dizziness    Positional  . Edema    feet/legs  . Fibromyalgia syndrome   . Fungal infection    Finger nails  . GERD (gastroesophageal reflux disease)   . Gout   . Headache    seasonal allergies  . Heart palpitations   . 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 Hypercholesterolemia   . Multiple sclerosis (H[facility])    weakness  . Nephrolithiasis    kidney stones  . Osteoporosis    osteoarthritis  . Pneumonia   . PONV (postoperative nausea and vomiting)    no problem after cataract surgery  . Psoriasis   . Renal stone   . Shortness of breath dyspnea    wheezing  . Sleep apnea 2012   sleep study / slight, no interventions  . Urinary frequency     PAST SURGICAL HISTORY:   Past Surgical History:  Procedure Laterality Date  . CATARACT EXTRACTION W/PHACO Left 05/21/2015   Procedure: CATARACT EXTRACTION PHACO AND INTRAOCULAR LENS PLACEMENT (IOC);   Surgeon: Birder Robson, MD;  Location: [facility];  Service: Ophthalmology;  Laterality: Left;  [place] 00:35 AP% 22.9 CDE 8.11 fluid pack lot [id number]  . CATARACT EXTRACTION W/PHACO Right 06/04/2015   Procedure: CATARACT EXTRACTION PHACO AND INTRAOCULAR LENS PLACEMENT (IOC);  Surgeon: Birder Robson, MD;  Location: [facility];  Service: Ophthalmology;  Laterality: Right;  US:00:48 AP%: 10.5 CDE:5.08 Fluid lot [id number]  . CYSTOSCOPY/URETEROSCOPY/HOLMIUM LASER/STENT PLACEMENT Bilateral 09/22/2016   Procedure: CYSTOSCOPY/URETEROSCOPY/HOLMIUM LASER/STENT PLACEMENT;  Surgeon: Hollice Espy, MD;  Location: [facility];  Service: Urology;  Laterality: Bilateral;  . EYE SURGERY  2015   tissue biopsy  . FOOT SURGERY  2015  . JOINT REPLACEMENT Left 2013   hip replacement  . LITHOTRIPSY    . PTOSIS REPAIR Bilateral 02/18/2016   Procedure: BILATERAL PTOSIS REPAIR UPPER EYELIDS;  Surgeon: Karle Starch, MD;  Location: [facility];  Service: Ophthalmology;  Laterality: Bilateral;  LEAVE PT EARLY AM  . thumb surgery Right   . TONSILLECTOMY  1973    SOCIAL HISTORY:   Social History  Substance Use Topics  . Smoking status: Current Some Day Smoker    Packs/day: 0.50    Years: 25.00    Types: Cigarettes    Last attempt to quit: 11/16/2012  . Smokeless tobacco: Never Used     Comment: recenty 04/26/2014  . Alcohol  use No    FAMILY HISTORY:   Family History  Problem Relation Age of Onset  . Cancer Father     Abdomen with mastasis  . Cancer Mother   . Heart disease Mother   . Kidney disease Neg Hx   . Bladder Cancer Neg Hx   . Prostate cancer Neg Hx     DRUG ALLERGIES:   Allergies  Allergen Reactions  . Albuterol Shortness Of Breath and Other (See Comments)    Makes pt feel jittery/ tacycardic  . Halcion [Triazolam] Other (See Comments)    Dizziness,headaches,bladder problems  . Levaquin [Levofloxacin In D5w] Diarrhea and Itching    Shoulder pain  . Naproxen Sodium Swelling     Patient tolerates in small doses  . Tylenol [Acetaminophen] Swelling    Patient tolerates in small doses  . Cefaclor Other (See Comments)    Doesn't remember---unsure if actually allergic   . Diclofenac Sodium Other (See Comments)    "made very sick"  . Sulfa Antibiotics Itching    Unsure of reaction possibly itching  . Tramadol Itching and Nausea And Vomiting  . Aripiprazole Other (See Comments)    Muscle tension/cramping  . Ibuprofen Swelling    Patient tolerates in small doses    REVIEW OF SYSTEMS:   Review of Systems  Constitutional: Positive for chills, fever and malaise/fatigue.  HENT: Negative for congestion.   Eyes: Negative for blurred vision and double vision.  Respiratory: Negative for cough, sputum production, shortness of breath, wheezing and stridor.   Cardiovascular: Negative for chest pain and leg swelling.  Gastrointestinal: Negative for abdominal pain, blood in stool, diarrhea, melena and vomiting.  Genitourinary: Negative for dysuria, frequency and hematuria.  Musculoskeletal: Positive for back pain.       Chronic back pain  Skin: Negative for itching and rash.  Neurological: Negative for dizziness, focal weakness and loss of consciousness.  Psychiatric/Behavioral: Negative for depression. The patient is not nervous/anxious.     MEDICATIONS AT HOME:   Prior to Admission medications   Medication Sig Start Date End Date Taking? Authorizing Provider  ASPERCREME LIDOCAINE EX Apply 1 application topically 4 (four) times daily as needed (for pain.).    Yes Historical Provider, MD  aspirin 325 MG tablet Take 325 mg by mouth every 4 (four) hours as needed for headache.    Yes Historical Provider, MD  Biotin 5000 MCG TABS Take 10,000 mcg by mouth every evening.   Yes Historical Provider, MD  calcipotriene-betamethasone (TACLONEX) ointment Apply 1 application topically daily as needed (for psorasis).    Yes Historical Provider, MD  carboxymethylcellulose (REFRESH  TEARS) 0.5 % SOLN Place 1-2 drops into both eyes 3 (three) times daily as needed.   Yes Historical Provider, MD  cyanocobalamin (,VITAMIN B-12,) 1000 MCG/ML injection Inject 1,000 mcg into the muscle every 8 (eight) weeks. Every other month   Yes Historical Provider, MD  docusate sodium (COLACE) 100 MG capsule Take 1 capsule (100 mg total) by mouth 2 (two) times daily. 09/22/16  Yes Hollice Espy, MD  DULoxetine (CYMBALTA) 20 MG capsule Take 40 mg by mouth at bedtime.  05/28/15  Yes Historical Provider, MD  HYDROcodone-acetaminophen (NORCO) 10-325 MG tablet Take 1 tablet by mouth every 8 (eight) hours as needed for severe pain. 10/06/16  Yes [name], NP  hydroquinone 4 % cream 1 APPLICATION TOPICALLY DAILY AS NEEDED FOR BLEMISHES. 05/16/15  Yes Historical Provider, MD  hydrOXYzine (VISTARIL) 50 MG capsule Take 100 mg by mouth at  bedtime.    Yes Historical Provider, MD  lamoTRIgine (LAMICTAL) 150 MG tablet Take two tablets by mouth daily Patient taking differently: Take 300 mg by mouth at bedtime.  10/10/13  Yes Jearld Fenton, NP  levalbuterol [facility] HFA) 45 MCG/ACT inhaler Inhale 1-2 puffs into the lungs every 6 (six) hours as needed for wheezing. 11/12/15  Yes [name], NP  Lysine 1000 MG TABS Take 2,000-4,000 mg by mouth at bedtime. 2000 mg scheduled at bedtime and patient will take 4000 mg if she has outbreak   Yes Historical Provider, MD  mirabegron ER (MYRBETRIQ) 25 MG TB24 tablet TAKE ONE TABLET BY MOUTH ONE TIME DAILY AT BEDTIME 08/21/16  Yes Historical Provider, MD  nicotine (NICODERM CQ - DOSED IN MG/24 HOURS) 21 mg/24hr patch Place 1 patch (21 mg total) onto the skin daily. 02/20/16  Yes [name], NP  ondansetron (ZOFRAN ODT) 4 MG disintegrating tablet Take 1 tablet (4 mg total) by mouth every 8 (eight) hours as needed for nausea. 09/07/16  Yes Larene Pickett, PA-C  polyethylene glycol powder (GLYCOLAX/MIRALAX) powder MIX 17 GRAMS (1 CAPFUL) WITH 4-8 OZ OF LIQUID AND  TAKE BY MOUTH TWICE DAILY AS NEEDED Patient taking differently: MIX 17 GRAMS (1 CAPFUL) WITH 4-8 OZ OF LIQUID AND TAKE BY MOUTH TWICE DAILY AS NEEDED FOR CONSTIPATION 08/24/16  Yes [name], NP  tamsulosin (FLOMAX) 0.4 MG CAPS capsule TAKE 1 CAPSULE (0.4 MG TOTAL) BY MOUTH DAILY. 10/10/16  Yes Shannon A McGowan, PA-C  temazepam (RESTORIL) 30 MG capsule TAKE ONE TO TWO CAPSULES BY MOUTH NIGHTLY AT BEDTIME AS NEEDED  Patient taking differently: TAKE ONE TO TWO CAPSULES BY MOUTH NIGHTLY AT BEDTIME 07/05/14  Yes Lucille Passy, MD  tiZANidine (ZANAFLEX) 4 MG tablet TAKE 2 TABLETS (8 MG TOTAL) BY MOUTH 3 (THREE) TIMES DAILY. Patient taking differently: TAKE 2 TABLETS (8 MG TOTAL) BY MOUTH 3 (THREE) TIMES DAILY AS NEEDED FOR MUSCLE SPASMS (SCHEDULED AT BEDTIME) 05/29/16  Yes [name], NP  zolpidem (AMBIEN) 10 MG tablet Take 10 mg by mouth at bedtime.    Yes Historical Provider, MD  clobetasol cream (TEMOVATE) [id number] % Apply 1 application topically 2 (two) times daily. Patient taking differently: Apply 1 application topically 2 (two) times daily as needed (for irriation).  03/11/15   Owens Loffler, MD  diclofenac sodium (VOLTAREN) 1 % GEL Apply 4 g topically 4 (four) times daily. Patient taking differently: Apply 4 g topically 4 (four) times daily as needed (for pain.).  11/15/14   Jearld Fenton, NP  neomycin-bacitracin-polymyxin (NEOSPORIN) [phone] ointment Apply 1 application topically 4 (four) times daily as needed (for cut/scrapes.).    Historical Provider, MD      VITAL SIGNS:  Blood pressure 130/66, pulse (!) 109, temperature (!) 103.2 F (39.6 C), temperature source Oral, resp. rate 16, height 5\' 9"  (1.753 m), weight 208 lb (94.3 kg), last menstrual period 08/23/2014, SpO2 94 %.  PHYSICAL EXAMINATION:  Physical Exam  GENERAL:  55 y.o.-year-old patient lying in the bed with no acute distress. Obese. EYES: Pupils equal, round, reactive to light and accommodation. No scleral  icterus. Extraocular muscles intact.  HEENT: Head atraumatic, normocephalic. Oropharynx and nasopharynx clear.  NECK:  Supple, no jugular venous distention. No thyroid enlargement, no tenderness.  LUNGS: Normal breath sounds bilaterally, no wheezing, rales,rhonchi or crepitation. No use of accessory muscles of respiration.  CARDIOVASCULAR: S1, S2 normal. No murmurs, rubs, or gallops.  ABDOMEN: Soft, tenderness, nondistended.  Bowel sounds present. No organomegaly or mass.  EXTREMITIES: No pedal edema, cyanosis, or clubbing.  NEUROLOGIC: Cranial nerves II through XII are intact. Muscle strength 5/5 in all extremities. Sensation intact. Gait not checked.  PSYCHIATRIC: The patient is alert and oriented x 3.  SKIN: No obvious rash, lesion, or ulcer.   LABORATORY PANEL:   CBC  Recent Labs Lab 10/10/16 1154  WBC 12.9*  HGB 13.5  HCT 39.4  PLT 309   ------------------------------------------------------------------------------------------------------------------  Chemistries   Recent Labs Lab 10/10/16 1154  NA 134*  K 3.7  CL 97*  CO2 27  GLUCOSE 95  BUN 10  CREATININE 0.95  CALCIUM 8.9  AST 15  ALT 11*  ALKPHOS 72  BILITOT 0.9   ------------------------------------------------------------------------------------------------------------------  Cardiac Enzymes No results for input(s): TROPONINI in the last 168 hours. ------------------------------------------------------------------------------------------------------------------  RADIOLOGY:  Dg Chest Port 1 View  Result Date: 10/10/2016 CLINICAL DATA:  Fevers following ureteral stent removal EXAM: PORTABLE CHEST 1 VIEW COMPARISON:  01/17/2011 FINDINGS: The heart size and mediastinal contours are within normal limits. Both lungs are clear. The visualized skeletal structures are unremarkable. IMPRESSION: No active disease. Electronically Signed   By: Inez Catalina M.D.   On: 10/10/2016 12:17   Ct Renal Stone  Study  Result Date: 10/10/2016 CLINICAL DATA:  Bilateral flank pain EXAM: CT ABDOMEN AND PELVIS WITHOUT CONTRAST TECHNIQUE: Multidetector CT imaging of the abdomen and pelvis was performed following the standard protocol without IV contrast. COMPARISON:  None. FINDINGS: Lower chest: No acute abnormality. Hepatobiliary: No focal liver abnormality is seen. No gallstones, gallbladder wall thickening, or biliary dilatation. Pancreas: Unremarkable. No pancreatic ductal dilatation or surrounding inflammatory changes. Spleen: Normal in size without focal abnormality. Adrenals/Urinary Tract: The adrenal glands are within normal limits. The left kidney shows no evidence of renal calculi. A small 3-4 mm nonobstructing stone is noted in the lower pole of the right kidney. No ureteral calculi are seen. The bladder is partially decompressed. Stomach/Bowel: Stomach is within normal limits. Appendix appears normal. No evidence of bowel wall thickening, distention, or inflammatory changes. Scattered diverticular change is noted without diverticulitis. Vascular/Lymphatic: No significant vascular findings are present. No enlarged abdominal or pelvic lymph nodes. Reproductive: Uterus and bilateral adnexa are unremarkable. Other: No abdominal wall hernia or abnormality. No abdominopelvic ascites. Musculoskeletal: Left hip replacement is noted. No acute bony abnormality is seen. Degenerative changes of the lumbar spine are noted. Anterolisthesis of L4 on L5 is noted of a degenerative basis. IMPRESSION: Nonobstructing right renal stone. No acute abnormality is identified. Electronically Signed   By: Inez Catalina M.D.   On: 10/10/2016 15:10      IMPRESSION AND PLAN:   Sepsis with UTI. The patient will be admitted to medical floor. She was treated with Rocephin IV 1 dose in the ED without any allergic reaction. I will continue Rocephin, IV fluids to support,  follow-up CBC, blood culture and urine culture.  Hyponatremia.  Normal saline IV and follow-up BMP.  Chronic back pain. Pain control when necessary.  Asthma. Stable. NEB when necessary.  Tobacco abuse. Smoking cessation was counseled for 3 minutes, nicotine patch.  All the records are reviewed and case discussed with ED provider. Management plans discussed with the patient, her sister and they are in agreement.  CODE STATUS: Full code  TOTAL TIME TAKING CARE OF THIS PATIENT: 58 minutes.    Demetrios Loll M.D on 10/10/2016 at 5:47 PM  Between 7am to 6pm - Pager - [phone]  After 6pm go  to [email or web address] - Proofreader  [facility]  Office  [phone]  CC: Primary care physician; Sheral Flow, NP   Note: This dictation was prepared with Dragon dictation along with smaller phrase technology. Any transcriptional errors that result from this process are unintentional.

## 2016-10-11 ENCOUNTER — Inpatient Hospital Stay: Payer: PPO

## 2016-10-11 DIAGNOSIS — A419 Sepsis, unspecified organism: Secondary | ICD-10-CM | POA: Diagnosis not present

## 2016-10-11 DIAGNOSIS — N179 Acute kidney failure, unspecified: Secondary | ICD-10-CM | POA: Diagnosis not present

## 2016-10-11 DIAGNOSIS — Z716 Tobacco abuse counseling: Secondary | ICD-10-CM | POA: Diagnosis not present

## 2016-10-11 DIAGNOSIS — R1032 Left lower quadrant pain: Secondary | ICD-10-CM

## 2016-10-11 DIAGNOSIS — R509 Fever, unspecified: Secondary | ICD-10-CM

## 2016-10-11 DIAGNOSIS — E871 Hypo-osmolality and hyponatremia: Secondary | ICD-10-CM | POA: Diagnosis not present

## 2016-10-11 DIAGNOSIS — N390 Urinary tract infection, site not specified: Secondary | ICD-10-CM | POA: Diagnosis not present

## 2016-10-11 LAB — BASIC METABOLIC PANEL
ANION GAP: 8 (ref 5–15)
BUN: 13 mg/dL (ref 6–20)
CALCIUM: 8.3 mg/dL — AB (ref 8.9–10.3)
CO2: 25 mmol/L (ref 22–32)
CREATININE: 1.31 mg/dL — AB (ref 0.44–1.00)
Chloride: 106 mmol/L (ref 101–111)
GFR calc Af Amer: 52 mL/min — ABNORMAL LOW (ref >60)
GFR, EST NON AFRICAN AMERICAN: 45 mL/min — AB (ref >60)
GLUCOSE: 130 mg/dL — AB (ref 65–99)
Potassium: 3.6 mmol/L (ref 3.5–5.1)
Sodium: 139 mmol/L (ref 135–145)

## 2016-10-11 LAB — CBC
HCT: 33.5 % — ABNORMAL LOW (ref 35.0–47.0)
HEMOGLOBIN: 11.3 g/dL — AB (ref 12.0–16.0)
MCH: 31.8 pg (ref 26.0–34.0)
MCHC: 33.6 g/dL (ref 32.0–36.0)
MCV: 94.7 fL (ref 80.0–100.0)
PLATELETS: 232 10*3/uL (ref 150–440)
RBC: 3.54 MIL/uL — ABNORMAL LOW (ref 3.80–5.20)
RDW: 13.3 % (ref 11.5–14.5)
WBC: 12.7 10*3/uL — ABNORMAL HIGH (ref 3.6–11.0)

## 2016-10-11 MED ORDER — SUMATRIPTAN SUCCINATE 50 MG PO TABS
100.0000 mg | ORAL_TABLET | Freq: Once | ORAL | Status: AC
  Administered 2016-10-11: 100 mg via ORAL
  Filled 2016-10-11: qty 2

## 2016-10-11 MED ORDER — CEFTRIAXONE SODIUM-DEXTROSE 1-3.74 GM-% IV SOLR
2.0000 g | Freq: Every day | INTRAVENOUS | Status: DC
  Filled 2016-10-11: qty 100

## 2016-10-11 MED ORDER — CEFTRIAXONE SODIUM-DEXTROSE 1-3.74 GM-% IV SOLR
2.0000 g | INTRAVENOUS | Status: DC
  Filled 2016-10-11: qty 100

## 2016-10-11 MED ORDER — SODIUM CHLORIDE 0.9 % IV SOLN
Freq: Once | INTRAVENOUS | Status: AC
  Administered 2016-10-11: 16:00:00 via INTRAVENOUS

## 2016-10-11 MED ORDER — BUTALBITAL-APAP-CAFFEINE 50-325-40 MG PO TABS
1.0000 | ORAL_TABLET | ORAL | Status: DC | PRN
  Administered 2016-10-11 – 2016-10-12 (×3): 1 via ORAL
  Filled 2016-10-11 (×4): qty 1

## 2016-10-11 MED ORDER — SODIUM CHLORIDE 0.9 % IV SOLN
Freq: Once | INTRAVENOUS | Status: AC
  Administered 2016-10-11: 15:00:00 via INTRAVENOUS

## 2016-10-11 MED ORDER — SODIUM CHLORIDE 0.9 % IV SOLN
800.0000 mg | Freq: Once | INTRAVENOUS | Status: AC
  Administered 2016-10-11: 800 mg via INTRAVENOUS
  Filled 2016-10-11: qty 8

## 2016-10-11 MED ORDER — CEFTRIAXONE SODIUM-DEXTROSE 2-2.22 GM-% IV SOLR
2.0000 g | INTRAVENOUS | Status: DC
  Administered 2016-10-11 – 2016-10-12 (×2): 2 g via INTRAVENOUS
  Filled 2016-10-11 (×2): qty 50

## 2016-10-11 NOTE — Progress Notes (Signed)
Patient c/o of HA ; Hospitalist notified and made aware an order will be put in; patient in bed no distress at this time; Will continue to monitor

## 2016-10-11 NOTE — Progress Notes (Signed)
Patient in bed with HOB elevated; asymptotic; BP low ; Dr.Hower made aware; order to give Bolus of Normal Saline. Will continue to monitor.

## 2016-10-11 NOTE — Progress Notes (Signed)
Patient admitted to floor; no distress; asymptomatic; alert and oriented x4; comfort measures provided; c/o of generalized body pain;  Will continue to monitor

## 2016-10-11 NOTE — Progress Notes (Signed)
  Pt c/o crampy LLQ pain. No dysuria or flank pain. She has a HA.   O: Vitals:   10/10/16 2042 10/11/16 0420  BP:  115/60  Pulse:  (!) 106  Resp:  18  Temp: (!) 101.1 F (38.4 C) (!) 100.8 F (38.2 C)    Intake/Output Summary (Last 24 hours) at 10/11/16 1102 Last data filed at 10/11/16 1000  Gross per 24 hour  Intake             2415 ml  Output              500 ml  Net             1915 ml  x 3 more voids   NAD Abd - soft, NT No CVAT   CBC    Component Value Date/Time   WBC 12.7 (H) 10/11/2016 0708   RBC 3.54 (L) 10/11/2016 0708   HGB 11.3 (L) 10/11/2016 0708   HCT 33.5 (L) 10/11/2016 0708   PLT 232 10/11/2016 0708   MCV 94.7 10/11/2016 0708   MCH 31.8 10/11/2016 0708   MCHC 33.6 10/11/2016 0708   RDW 13.3 10/11/2016 0708   LYMPHSABS 1.0 10/10/2016 1154   MONOABS 0.6 10/10/2016 1154   EOSABS 0.1 10/10/2016 1154   BASOSABS 0.0 10/10/2016 1154   BMET    Component Value Date/Time   NA 139 10/11/2016 0708   K 3.6 10/11/2016 0708   CL 106 10/11/2016 0708   CO2 25 10/11/2016 0708   GLUCOSE 130 (H) 10/11/2016 0708   BUN 13 10/11/2016 0708   CREATININE 1.31 (H) 10/11/2016 0708   CALCIUM 8.3 (L) 10/11/2016 0708   GFRNONAA 45 (L) 10/11/2016 0708   GFRAA 52 (L) 10/11/2016 0708    A/P - discussed pt with Dr. Lavetta Nielsen - he'll bump up to rocephin 2 g. Discussed possibility of other process such as divertioculitis but there was no evidence of it on CT. Her Cr bumped slightly. Will check renal u/s to ensure no hydro has developed. Will follow. So far urine and blood cx negative.

## 2016-10-11 NOTE — Progress Notes (Signed)
MD notified about patient temp and BP; Orders put to be put in by MD; Will continue to monitor.

## 2016-10-11 NOTE — Progress Notes (Signed)
[place] [facility] [place] NAME: Emily Moon    MRN#:  [id number]  DATE OF BIRTH:  January 18, 1961  SUBJECTIVE:  Hospital Day: 1 day Emily Moon is a 55 y.o. female presenting with Fever .   Overnight events: Febrile overnight Interval Events: No current complaints  REVIEW OF SYSTEMS:  CONSTITUTIONAL: Positive  fever, fatigue or weakness.  EYES: No blurred or double vision.  EARS, NOSE, AND THROAT: No tinnitus or ear pain.  RESPIRATORY: No cough, shortness of breath, wheezing or hemoptysis.  CARDIOVASCULAR: No chest pain, orthopnea, edema.  GASTROINTESTINAL: No nausea, vomiting, diarrhea or abdominal pain.  GENITOURINARY: No dysuria, hematuria.  ENDOCRINE: No polyuria, nocturia,  HEMATOLOGY: No anemia, easy bruising or bleeding SKIN: No rash or lesion. MUSCULOSKELETAL: No joint pain or arthritis.   NEUROLOGIC: No tingling, numbness, weakness.  PSYCHIATRY: No anxiety or depression.   DRUG ALLERGIES:   Allergies  Allergen Reactions  . Albuterol Shortness Of Breath and Other (See Comments)    Makes pt feel jittery/ tacycardic  . Halcion [Triazolam] Other (See Comments)    Dizziness,headaches,bladder problems  . Levaquin [Levofloxacin In D5w] Diarrhea and Itching    Shoulder pain  . Naproxen Sodium Swelling    Patient tolerates in small doses  . Tylenol [Acetaminophen] Swelling    Patient tolerates in small doses  . Cefaclor Other (See Comments)    Doesn't remember---unsure if actually allergic   . Diclofenac Sodium Other (See Comments)    "made very sick"  . Sulfa Antibiotics Itching    Unsure of reaction possibly itching  . Tramadol Itching and Nausea And Vomiting  . Aripiprazole Other (See Comments)    Muscle tension/cramping  . Ibuprofen Swelling    Patient tolerates in small doses    VITALS:  Blood pressure 115/60, pulse (!) 106, temperature (!) 100.8 F (38.2 C), temperature source Oral, resp. rate 18, height  5\' 9"  (1.753 m), weight 94.3 kg (208 lb), last menstrual period 08/23/2014, SpO2 95 %.  PHYSICAL EXAMINATION:  VITAL SIGNS: Vitals:   10/10/16 2032 10/11/16 0420  BP: (!) 145/72 115/60  Pulse: (!) 120 (!) 106  Resp: 18 18  Temp:  (!) 100.8 F (38.2 C)   GENERAL:55 y.o.female currently in no acute distress.  HEAD: Normocephalic, atraumatic.  EYES: Pupils equal, round, reactive to light. Extraocular muscles intact. No scleral icterus.  MOUTH: Moist mucosal membrane. Dentition intact. No abscess noted.  EAR, NOSE, THROAT: Clear without exudates. No external lesions.  NECK: Supple. No thyromegaly. No nodules. No JVD.  PULMONARY: Clear to ascultation, without wheeze rails or rhonci. No use of accessory muscles, Good respiratory effort. good air entry bilaterally CHEST: Nontender to palpation.  CARDIOVASCULAR: S1 and S2. Regular rate and rhythm. No murmurs, rubs, or gallops. No edema. Pedal pulses 2+ bilaterally.  GASTROINTESTINAL: Soft, nontender, nondistended. No masses. Positive bowel sounds. No hepatosplenomegaly.  MUSCULOSKELETAL: No swelling, clubbing, or edema. Range of motion full in all extremities.  NEUROLOGIC: Cranial nerves II through XII are intact. No gross focal neurological deficits. Sensation intact. Reflexes intact.  SKIN: No ulceration, lesions, rashes, or cyanosis. Skin warm and dry. Turgor intact.  PSYCHIATRIC: Mood, affect within normal limits. The patient is awake, alert and oriented x 3. Insight, judgment intact.      LABORATORY PANEL:   CBC  Recent Labs Lab 10/11/16 0708  WBC 12.7*  HGB 11.3*  HCT 33.5*  PLT 232   ------------------------------------------------------------------------------------------------------------------  Chemistries   Recent Labs Lab 10/10/16  1154 10/11/16 0708  NA 134* 139  K 3.7 3.6  CL 97* 106  CO2 27 25  GLUCOSE 95 130*  BUN 10 13  CREATININE 0.95 1.31*  CALCIUM 8.9 8.3*  AST 15  --   ALT 11*  --   ALKPHOS 72   --   BILITOT 0.9  --    ------------------------------------------------------------------------------------------------------------------  Cardiac Enzymes No results for input(s): TROPONINI in the last 168 hours. ------------------------------------------------------------------------------------------------------------------  RADIOLOGY:  US Renal  Result Date: 10/11/2016 CLINICAL DATA:  Acute renal failure EXAM: RENAL / URINARY TRACT ULTRASOUND COMPLETE COMPARISON:  10/10/2016 FINDINGS: Right Kidney: Length: 11.7 cm in length. No hydronephrosis. Normal echogenicity. Nonobstructive calculus in lower pole measures 5.6 mm. Left Kidney: Length: 12.9 cm. Echogenicity within normal limits. No mass or hydronephrosis visualized. Bladder: Appears normal for degree of bladder distention. IMPRESSION: 1. There is right nonobstructive nephrolithiasis. No hydronephrosis. Under distended urinary bladder. Electronically Signed   By: Lahoma Crocker M.D.   On: 10/11/2016 12:14   Dg Chest Port 1 View  Result Date: 10/10/2016 CLINICAL DATA:  Fevers following ureteral stent removal EXAM: PORTABLE CHEST 1 VIEW COMPARISON:  01/17/2011 FINDINGS: The heart size and mediastinal contours are within normal limits. Both lungs are clear. The visualized skeletal structures are unremarkable. IMPRESSION: No active disease. Electronically Signed   By: Inez Catalina M.D.   On: 10/10/2016 12:17   Ct Renal Stone Study  Result Date: 10/10/2016 CLINICAL DATA:  Bilateral flank pain EXAM: CT ABDOMEN AND PELVIS WITHOUT CONTRAST TECHNIQUE: Multidetector CT imaging of the abdomen and pelvis was performed following the standard protocol without IV contrast. COMPARISON:  None. FINDINGS: Lower chest: No acute abnormality. Hepatobiliary: No focal liver abnormality is seen. No gallstones, gallbladder wall thickening, or biliary dilatation. Pancreas: Unremarkable. No pancreatic ductal dilatation or surrounding inflammatory changes. Spleen:  Normal in size without focal abnormality. Adrenals/Urinary Tract: The adrenal glands are within normal limits. The left kidney shows no evidence of renal calculi. A small 3-4 mm nonobstructing stone is noted in the lower pole of the right kidney. No ureteral calculi are seen. The bladder is partially decompressed. Stomach/Bowel: Stomach is within normal limits. Appendix appears normal. No evidence of bowel wall thickening, distention, or inflammatory changes. Scattered diverticular change is noted without diverticulitis. Vascular/Lymphatic: No significant vascular findings are present. No enlarged abdominal or pelvic lymph nodes. Reproductive: Uterus and bilateral adnexa are unremarkable. Other: No abdominal wall hernia or abnormality. No abdominopelvic ascites. Musculoskeletal: Left hip replacement is noted. No acute bony abnormality is seen. Degenerative changes of the lumbar spine are noted. Anterolisthesis of L4 on L5 is noted of a degenerative basis. IMPRESSION: Nonobstructing right renal stone. No acute abnormality is identified. Electronically Signed   By: Inez Catalina M.D.   On: 10/10/2016 15:10    EKG:   Orders placed or performed in visit on 01/22/14  . EKG 12-Lead    ASSESSMENT AND PLAN:   Emily Moon is a 55 y.o. female presenting with Fever . Admitted 10/10/2016 : Day #: 1 day 1. Sepsis: Meeting septic criteria on admission, likely bacteremia versus urinary source-increased ceftriaxone 2 g daily, case discussed with urology length, follow culture data plan on downgrading antibiotics likely tomorrow transition to oral and discharge as long as afebrile  2. Headache: Supportive measures    All the records are reviewed and case discussed with Care Management/Social Workerr. Management plans discussed with the patient, family and they are in agreement.  CODE STATUS: full TOTAL TIME TAKING CARE OF  THIS PATIENT: 28 minutes.   POSSIBLE D/C IN 1-2DAYS, DEPENDING ON CLINICAL  CONDITION.   Mehdi Gironda,  Karenann Cai.D on 10/11/2016 at 12:50 PM  Between 7am to 6pm - Pager - [phone]  After 6pm: House Pager: - [phone]  [facility]  Office  [phone]  CC: Primary care physician; Sheral Flow, NP

## 2016-10-11 NOTE — Progress Notes (Signed)
Notified Dr Lavetta Nielsen of pt temp of 102.9; orders received for ibuprofen 800 mg IV once

## 2016-10-12 DIAGNOSIS — A419 Sepsis, unspecified organism: Principal | ICD-10-CM

## 2016-10-12 DIAGNOSIS — R109 Unspecified abdominal pain: Secondary | ICD-10-CM | POA: Diagnosis not present

## 2016-10-12 DIAGNOSIS — E871 Hypo-osmolality and hyponatremia: Secondary | ICD-10-CM | POA: Diagnosis not present

## 2016-10-12 DIAGNOSIS — N390 Urinary tract infection, site not specified: Secondary | ICD-10-CM | POA: Diagnosis not present

## 2016-10-12 DIAGNOSIS — R509 Fever, unspecified: Secondary | ICD-10-CM | POA: Diagnosis not present

## 2016-10-12 DIAGNOSIS — E538 Deficiency of other specified B group vitamins: Secondary | ICD-10-CM | POA: Diagnosis not present

## 2016-10-12 DIAGNOSIS — Z716 Tobacco abuse counseling: Secondary | ICD-10-CM | POA: Diagnosis not present

## 2016-10-12 MED ORDER — LORATADINE 10 MG PO TABS
10.0000 mg | ORAL_TABLET | Freq: Every day | ORAL | Status: DC
  Administered 2016-10-12 – 2016-10-14 (×3): 10 mg via ORAL
  Filled 2016-10-12 (×3): qty 1

## 2016-10-12 MED ORDER — IBUPROFEN 600 MG PO TABS
600.0000 mg | ORAL_TABLET | Freq: Once | ORAL | Status: AC
  Administered 2016-10-12: 600 mg via ORAL
  Filled 2016-10-12: qty 1

## 2016-10-12 MED ORDER — ACETAMINOPHEN 325 MG PO TABS
650.0000 mg | ORAL_TABLET | Freq: Three times a day (TID) | ORAL | Status: DC | PRN
  Administered 2016-10-12 – 2016-10-13 (×2): 650 mg via ORAL
  Filled 2016-10-12 (×3): qty 2

## 2016-10-12 MED ORDER — SODIUM CHLORIDE 0.9 % IV SOLN
1.0000 g | Freq: Four times a day (QID) | INTRAVENOUS | Status: DC
  Administered 2016-10-12 – 2016-10-14 (×8): 1 g via INTRAVENOUS
  Filled 2016-10-12 (×11): qty 1000

## 2016-10-12 MED ORDER — FLUTICASONE PROPIONATE 50 MCG/ACT NA SUSP
1.0000 | Freq: Every day | NASAL | Status: DC
  Administered 2016-10-12 – 2016-10-14 (×3): 1 via NASAL
  Filled 2016-10-12: qty 16

## 2016-10-12 NOTE — Progress Notes (Signed)
[place] at [place] NAME: Emily Moon    MRN#:  [id number]  DATE OF BIRTH:  19-Nov-1960  SUBJECTIVE:  Hospital Day: 2 days Emily Moon is a 55 y.o. female presenting with Fever .  Overnight events: Febrile overnight Interval Events: No current complaints  REVIEW OF SYSTEMS:  CONSTITUTIONAL: Positive  fever, fatigue or weakness.  EYES: No blurred or double vision.  EARS, NOSE, AND THROAT: No tinnitus or ear pain.  RESPIRATORY: No cough, shortness of breath, wheezing or hemoptysis.  CARDIOVASCULAR: No chest pain, orthopnea, edema.  GASTROINTESTINAL: No nausea, vomiting, diarrhea or abdominal pain.  GENITOURINARY: No dysuria, hematuria.  ENDOCRINE: No polyuria, nocturia,  HEMATOLOGY: No anemia, easy bruising or bleeding SKIN: No rash or lesion. MUSCULOSKELETAL: No joint pain or arthritis.   NEUROLOGIC: No tingling, numbness, weakness.  PSYCHIATRY: No anxiety or depression.   DRUG ALLERGIES:   Allergies  Allergen Reactions  . Albuterol Shortness Of Breath and Other (See Comments)    Makes pt feel jittery/ tacycardic  . Halcion [Triazolam] Other (See Comments)    Dizziness,headaches,bladder problems  . Levaquin [Levofloxacin In D5w] Diarrhea and Itching    Shoulder pain  . Naproxen Sodium Swelling    Patient tolerates in small doses  . Tylenol [Acetaminophen] Swelling    Patient tolerates in small doses  . Cefaclor Other (See Comments)    Doesn't remember---unsure if actually allergic   . Diclofenac Sodium Other (See Comments)    "made very sick"  . Sulfa Antibiotics Itching    Unsure of reaction possibly itching  . Tramadol Itching and Nausea And Vomiting  . Aripiprazole Other (See Comments)    Muscle tension/cramping  . Ibuprofen Swelling    Patient tolerates in small doses    VITALS:  Blood pressure 113/62, pulse 81, temperature (!) 100.7 F (38.2 C), temperature source Oral, resp. rate 18, height 5\' 9"   (1.753 m), weight 94.3 kg (208 lb), last menstrual period 08/23/2014, SpO2 97 %.  PHYSICAL EXAMINATION:  VITAL SIGNS: Vitals:   10/12/16 1410 10/12/16 1605  BP: 113/62   Pulse: 81   Resp: 18   Temp: 99.8 F (37.7 C) (!) 100.7 F (38.2 C)   GENERAL:55 y.o.female currently in no acute distress.  HEAD: Normocephalic, atraumatic.  EYES: Pupils equal, round, reactive to light. Extraocular muscles intact. No scleral icterus.  MOUTH: Moist mucosal membrane. Dentition intact. No abscess noted.  EAR, NOSE, THROAT: Clear without exudates. No external lesions.  NECK: Supple. No thyromegaly. No nodules. No JVD.  PULMONARY: Clear to ascultation, without wheeze rails or rhonci. No use of accessory muscles, Good respiratory effort. good air entry bilaterally CHEST: Nontender to palpation.  CARDIOVASCULAR: S1 and S2. Regular rate and rhythm. No murmurs, rubs, or gallops. No edema. Pedal pulses 2+ bilaterally.  GASTROINTESTINAL: Soft, nontender, nondistended. No masses. Positive bowel sounds. No hepatosplenomegaly.  MUSCULOSKELETAL: No swelling, clubbing, or edema. Range of motion full in all extremities.  NEUROLOGIC: Cranial nerves II through XII are intact. No gross focal neurological deficits. Sensation intact. Reflexes intact.  SKIN: No ulceration, lesions, rashes, or cyanosis. Skin warm and dry. Turgor intact.  PSYCHIATRIC: Mood, affect within normal limits. The patient is awake, alert and oriented x 3. Insight, judgment intact.      LABORATORY PANEL:   CBC  Recent Labs Lab 10/11/16 0708  WBC 12.7*  HGB 11.3*  HCT 33.5*  PLT 232   ------------------------------------------------------------------------------------------------------------------  Chemistries   Recent Labs Lab 10/10/16 1154 10/11/16  0708  NA 134* 139  K 3.7 3.6  CL 97* 106  CO2 27 25  GLUCOSE 95 130*  BUN 10 13  CREATININE 0.95 1.31*  CALCIUM 8.9 8.3*  AST 15  --   ALT 11*  --   ALKPHOS 72  --   BILITOT  0.9  --    ------------------------------------------------------------------------------------------------------------------  Cardiac Enzymes No results for input(s): TROPONINI in the last 168 hours. ------------------------------------------------------------------------------------------------------------------  RADIOLOGY:  US Renal  Result Date: 10/11/2016 CLINICAL DATA:  Acute renal failure EXAM: RENAL / URINARY TRACT ULTRASOUND COMPLETE COMPARISON:  10/10/2016 FINDINGS: Right Kidney: Length: 11.7 cm in length. No hydronephrosis. Normal echogenicity. Nonobstructive calculus in lower pole measures 5.6 mm. Left Kidney: Length: 12.9 cm. Echogenicity within normal limits. No mass or hydronephrosis visualized. Bladder: Appears normal for degree of bladder distention. IMPRESSION: 1. There is right nonobstructive nephrolithiasis. No hydronephrosis. Under distended urinary bladder. Electronically Signed   By: Lahoma Crocker M.D.   On: 10/11/2016 12:14   ASSESSMENT AND PLAN:   Emily Moon is a 55 y.o. female presenting with Fever . Admitted 10/10/2016 : Day #: 2 days 1. Sepsis: Meeting septic criteria on admission, likely bacteremia versus urinary source -UC growing enterococcus -change to IV ampicillin. D/w pharmacy -appreciate dr Cherrie Gauze input  2. Headache: Supportive measures  3. Nephrolithiasis/ureterolithiasis -s/p bilateral stent placement on 09/22/16 and removal on 10/07/16   All the records are reviewed and case discussed with Care Management/Social Workerr. Management plans discussed with the patient, family and they are in agreement.  CODE STATUS: full TOTAL TIME TAKING CARE OF THIS PATIENT: 28 minutes.   POSSIBLE D/C IN 1-2DAYS, DEPENDING ON CLINICAL CONDITION.   Dwan Hemmelgarn M.D on 10/12/2016 at 5:51 PM  Between 7am to 6pm - Pager - [phone]  After 6pm: House Pager: - [phone]  [facility]  Office  [phone]  CC: Primary care  physician; Sheral Flow, NP

## 2016-10-12 NOTE — Progress Notes (Signed)
10/12/2016 6:45 PM  Pt called nurses station complaining of difficulty breathing and dizziness.  All vital signs stable aside from temp which measured at 102/9. Lung sounds clear.  Administered 600 mg Ibuprofen and PRN Xopenex breathing treatment.  Will recheck vital signs at appropriate time.  Will continue to monitor and assess pt.

## 2016-10-12 NOTE — Progress Notes (Signed)
10/12/16  Urology consult f/u  S: Fever to 102.9 over past 24 hours although fever cure does to appear to be stable.  BP at baseline.  No complaints this AM other feels "high" from concomitantly administered tizanidine and narcotics for chronic pain.    O: Vitals:   10/12/16 0451 10/12/16 0500  BP: 100/62 98/60  Pulse: 93   Resp: 18   Temp: 98.9 F (37.2 C)     Intake/Output Summary (Last 24 hours) at 10/12/16 0918 Last data filed at 10/12/16 0400  Gross per 24 hour  Intake          4168.83 ml  Output              601 ml  Net          3567.83 ml  x 3 more voids   NAD Slurred speech at times, dozing off Abd - soft, NT No CVAT   CBC    Component Value Date/Time   WBC 12.7 (H) 10/11/2016 0708   RBC 3.54 (L) 10/11/2016 0708   HGB 11.3 (L) 10/11/2016 0708   HCT 33.5 (L) 10/11/2016 0708   PLT 232 10/11/2016 0708   MCV 94.7 10/11/2016 0708   MCH 31.8 10/11/2016 0708   MCHC 33.6 10/11/2016 0708   RDW 13.3 10/11/2016 0708   LYMPHSABS 1.0 10/10/2016 1154   MONOABS 0.6 10/10/2016 1154   EOSABS 0.1 10/10/2016 1154   BASOSABS 0.0 10/10/2016 1154   BMET    Component Value Date/Time   NA 139 10/11/2016 0708   K 3.6 10/11/2016 0708   CL 106 10/11/2016 0708   CO2 25 10/11/2016 0708   GLUCOSE 130 (H) 10/11/2016 0708   BUN 13 10/11/2016 0708   CREATININE 1.31 (H) 10/11/2016 0708   CALCIUM 8.3 (L) 10/11/2016 0708   GFRNONAA 45 (L) 10/11/2016 0708   GFRAA 52 (L) 10/11/2016 0708    RUS 10/11/16 CLINICAL DATA:  Acute renal failure  EXAM: RENAL / URINARY TRACT ULTRASOUND COMPLETE  COMPARISON:  10/10/2016  FINDINGS: Right Kidney:  Length: 11.7 cm in length. No hydronephrosis. Normal echogenicity. Nonobstructive calculus in lower pole measures 5.6 mm.  Left Kidney:  Length: 12.9 cm. Echogenicity within normal limits. No mass or hydronephrosis visualized.  Bladder:  Appears normal for degree of bladder distention.  IMPRESSION: 1. There is right  nonobstructive nephrolithiasis. No hydronephrosis. Under distended urinary bladder.   Electronically Signed   By: Lahoma Crocker M.D.   On: 10/11/2016 12:14   A/P - 55 yo F with MS admitted with fevers of unclear etiology following bilateral ureteroscopy, stent removal. Continues to spike fevers although overall fever curve improving.  Urine and blood cultures negative to date, urine being reintubated for better growth.  Repeat renal ultrasound yesterday without evidence of hydronephrosis, incomplete bladder emptying.  Etiology of fevers remains unclear.  Recommend keeping patient in-house until afebrile 24 hours.  Hollice Espy, MD

## 2016-10-12 NOTE — Progress Notes (Addendum)
Manual BP 98/60, prior check with dinamap was 78/41. Pt denies any s/s of hypotension. Pt also requesting her tizanidine, fiorcet and norco for chronic leg back and headache pains. She denies pain at present, but prefers to take them as a "preventative." Educated pt on possible hypotensive side effects of pain meds and suggested for her to utilize 1 pain med if needed, and then reassess relief to determine need for more medication. Pt immediately reported chronic back pain as a "7/10" and a headache. Pt agreeable to try norco and RN will re-evaluate pain relief. Advised to notify RN with any s/s of hypotension.  Addendum: Dr Estanislado Pandy notified of above, no new orders received.

## 2016-10-12 NOTE — Progress Notes (Signed)
Pt alert. Lives with sister but not close relationship. CH is available.   10/12/16 1130  Clinical Encounter Type  Visited With Patient  Visit Type Initial  Referral From Nurse  Spiritual Encounters  Spiritual Needs Emotional  Stress Factors  Patient Stress Factors Health changes

## 2016-10-13 ENCOUNTER — Telehealth: Payer: Self-pay

## 2016-10-13 DIAGNOSIS — E871 Hypo-osmolality and hyponatremia: Secondary | ICD-10-CM | POA: Diagnosis not present

## 2016-10-13 DIAGNOSIS — N390 Urinary tract infection, site not specified: Secondary | ICD-10-CM | POA: Diagnosis not present

## 2016-10-13 DIAGNOSIS — Z716 Tobacco abuse counseling: Secondary | ICD-10-CM | POA: Diagnosis not present

## 2016-10-13 DIAGNOSIS — R109 Unspecified abdominal pain: Secondary | ICD-10-CM | POA: Diagnosis not present

## 2016-10-13 DIAGNOSIS — E538 Deficiency of other specified B group vitamins: Secondary | ICD-10-CM | POA: Diagnosis not present

## 2016-10-13 DIAGNOSIS — A419 Sepsis, unspecified organism: Secondary | ICD-10-CM | POA: Diagnosis not present

## 2016-10-13 DIAGNOSIS — N10 Acute tubulo-interstitial nephritis: Secondary | ICD-10-CM | POA: Diagnosis not present

## 2016-10-13 DIAGNOSIS — R509 Fever, unspecified: Secondary | ICD-10-CM | POA: Diagnosis not present

## 2016-10-13 LAB — BASIC METABOLIC PANEL
Anion gap: 4 — ABNORMAL LOW (ref 5–15)
BUN: 7 mg/dL (ref 6–20)
CALCIUM: 8 mg/dL — AB (ref 8.9–10.3)
CHLORIDE: 111 mmol/L (ref 101–111)
CO2: 25 mmol/L (ref 22–32)
CREATININE: 0.7 mg/dL (ref 0.44–1.00)
GFR calc non Af Amer: >60 mL/min (ref >60)
Glucose, Bld: 171 mg/dL — ABNORMAL HIGH (ref 65–99)
Potassium: 3.5 mmol/L (ref 3.5–5.1)
SODIUM: 140 mmol/L (ref 135–145)

## 2016-10-13 LAB — URINALYSIS COMPLETE WITH MICROSCOPIC (ARMC ONLY)
BILIRUBIN URINE: NEGATIVE
Bacteria, UA: NONE SEEN
GLUCOSE, UA: NEGATIVE mg/dL
Ketones, ur: NEGATIVE mg/dL
Leukocytes, UA: NEGATIVE
NITRITE: NEGATIVE
Protein, ur: NEGATIVE mg/dL
SPECIFIC GRAVITY, URINE: 1.004 — AB (ref 1.005–1.030)
pH: 7 (ref 5.0–8.0)

## 2016-10-13 LAB — URINE CULTURE: Culture: 20000 — AB

## 2016-10-13 MED ORDER — TRAMADOL HCL 50 MG PO TABS
50.0000 mg | ORAL_TABLET | Freq: Once | ORAL | Status: AC
  Administered 2016-10-13: 50 mg via ORAL
  Filled 2016-10-13: qty 1

## 2016-10-13 MED ORDER — TRAMADOL HCL 50 MG PO TABS
50.0000 mg | ORAL_TABLET | Freq: Three times a day (TID) | ORAL | Status: DC | PRN
  Administered 2016-10-13 – 2016-10-14 (×2): 50 mg via ORAL
  Filled 2016-10-13 (×2): qty 1

## 2016-10-13 NOTE — Progress Notes (Signed)
Pt complains of having a headache and states tylenol does not help with headache. Dr. Posey Pronto notified, verbal orders to place one time dose of tramadol 50 mg PO. Will continue to monitor pt.   Devan Babino [employer]

## 2016-10-13 NOTE — Progress Notes (Signed)
10/13/16  Urology consult f/u  S: Patient with another fever spike to 102 yesterday evening around 6 PM.   Currently has a low-grade fever.   BP at baseline.  No complaints this AM.    States urinating without difficulty.    O: Vitals:   10/12/16 1951 10/13/16 0455  BP: 116/76 104/64  Pulse: 80 72  Resp: 16 20  Temp: 97.8 F (36.6 C) 99.5 F (37.5 C)    Intake/Output Summary (Last 24 hours) at 10/13/16 0721 Last data filed at 10/13/16 0329  Gross per 24 hour  Intake             4319 ml  Output              650 ml  Net             3669 ml  x 3 more voids   Constitutional: Well nourished. Alert and oriented, No acute distress. HEENT: [place] AT, moist mucus membranes. Trachea midline, no masses. Cardiovascular: No clubbing, cyanosis, or edema. Respiratory: Normal respiratory effort, no increased work of breathing. GI: Abdomen is soft, non tender, non distended, no abdominal masses. Liver and spleen not palpable.  No hernias appreciated.  Stool sample for occult testing is not indicated.   GU: No CVA tenderness.  No bladder fullness or masses.   Skin: No rashes, bruises or suspicious lesions. Lymph: No cervical or inguinal adenopathy. Neurologic: Grossly intact, no focal deficits, moving all 4 extremities. Psychiatric: Normal mood and affect.  CBC    Component Value Date/Time   WBC 12.7 (H) 10/11/2016 0708   RBC 3.54 (L) 10/11/2016 0708   HGB 11.3 (L) 10/11/2016 0708   HCT 33.5 (L) 10/11/2016 0708   PLT 232 10/11/2016 0708   MCV 94.7 10/11/2016 0708   MCH 31.8 10/11/2016 0708   MCHC 33.6 10/11/2016 0708   RDW 13.3 10/11/2016 0708   LYMPHSABS 1.0 10/10/2016 1154   MONOABS 0.6 10/10/2016 1154   EOSABS 0.1 10/10/2016 1154   BASOSABS 0.0 10/10/2016 1154   BMET    Component Value Date/Time   NA 140 10/13/2016 0446   K 3.5 10/13/2016 0446   CL 111 10/13/2016 0446   CO2 25 10/13/2016 0446   GLUCOSE 171 (H) 10/13/2016 0446   BUN 7 10/13/2016 0446   CREATININE 0.70  10/13/2016 0446   CALCIUM 8.0 (L) 10/13/2016 0446   GFRNONAA >60 10/13/2016 0446   GFRAA >60 10/13/2016 0446    RUS 10/11/16 CLINICAL DATA:  Acute renal failure  EXAM: RENAL / URINARY TRACT ULTRASOUND COMPLETE  COMPARISON:  10/10/2016  FINDINGS: Right Kidney:  Length: 11.7 cm in length. No hydronephrosis. Normal echogenicity. Nonobstructive calculus in lower pole measures 5.6 mm.  Left Kidney:  Length: 12.9 cm. Echogenicity within normal limits. No mass or hydronephrosis visualized.  Bladder:  Appears normal for degree of bladder distention.  IMPRESSION: 1. There is right nonobstructive nephrolithiasis. No hydronephrosis. Under distended urinary bladder.   Electronically Signed   By: Lahoma Crocker M.D.   On: 10/11/2016 12:14   A/P - 55 yo F with MS admitted with fevers of unclear etiology following bilateral ureteroscopy, stent removal. Continues to spike fevers although overall fever curve improving.  Urine and blood cultures negative to date, urine being reintubated for better growth- still in preliminary status  Repeated renal ultrasound without evidence of hydronephrosis, incomplete bladder emptying.  Etiology of fevers remains unclear.  Recommend keeping patient in-house until afebrile 24 hours.  Toiya Morrish, PA-C

## 2016-10-13 NOTE — Telephone Encounter (Signed)
Is the patient requesting the refill?

## 2016-10-13 NOTE — Telephone Encounter (Signed)
Pt pharmacy sent a refill request for myrbetriq 25mg . Please advise.

## 2016-10-13 NOTE — Consult Note (Signed)
[facility] Infectious [facility]     Reason for Consult: Pyelonephritis, Fever   Referring Physician: Nicholes Mango Date of Admission:  10/10/2016   Active Problems:   Sepsis ([place])   HPI: SHUNTELL FOODY is a 55 y.o. female admitted with fevers and chills for 2 days. She had prior admission for bilateral stone extraction and stent placement Nov 7 for nephrolithiasis. She had removal of the stent in urology office 11/22.   On admission temp 103.6, wbc 13 and started on ceftriaxone. UCX turned + for enterococcus and changed to ampicillin 11/27 but spiked again to 103 on 11.27. Stone protocol CT showed R stone but no obstruction.   Clinically feels better today.     Past Medical History:  Diagnosis Date  . Arthritis    osteo  . Asthma   . Bipolar disorder ([place]) 05/21/14  . Cataracts, bilateral   . DDD (degenerative disc disease), cervical    also back  . Depression   . Dizziness    Positional  . Edema    feet/legs  . Fibromyalgia syndrome   . Fungal infection    Finger nails  . GERD (gastroesophageal reflux disease)   . Gout   . Headache    seasonal allergies  . Heart palpitations   . 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 Hypercholesterolemia   . Multiple sclerosis (HCC)    weakness  . Nephrolithiasis    kidney stones  . Osteoporosis    osteoarthritis  . Pneumonia   . PONV (postoperative nausea and vomiting)    no problem after cataract surgery  . Psoriasis   . Renal stone   . Shortness of breath dyspnea    wheezing  . Sleep apnea 2012   sleep study / slight, no interventions  . Urinary frequency    Past Surgical History:  Procedure Laterality Date  . CATARACT EXTRACTION W/PHACO Left 05/21/2015   Procedure: CATARACT EXTRACTION PHACO AND INTRAOCULAR LENS PLACEMENT (IOC);  Surgeon: Birder Robson, MD;  Location: [facility] OR[facility];  Service: Ophthalmology;  Laterality: Left;  [place] 00:35 AP% 22.9 CDE 8.11 fluid pack lot #[id number]  . CATARACT EXTRACTION W/PHACO Right  06/04/2015   Procedure: CATARACT EXTRACTION PHACO AND INTRAOCULAR LENS PLACEMENT (IOC);  Surgeon: Birder Robson, MD;  Location: [facility];  Service: Ophthalmology;  Laterality: Right;  US:00:48 AP%: 10.5 CDE:5.08 Fluid lot #[id number]  . CYSTOSCOPY/URETEROSCOPY/HOLMIUM LASER/STENT PLACEMENT Bilateral 09/22/2016   Procedure: CYSTOSCOPY/URETEROSCOPY/HOLMIUM LASER/STENT PLACEMENT;  Surgeon: Hollice Espy, MD;  Location: [facility] OR[facility];  Service: Urology;  Laterality: Bilateral;  . EYE SURGERY  2015   tissue biopsy  . FOOT SURGERY  2015  . JOINT REPLACEMENT Left 2013   hip replacement  . LITHOTRIPSY    . PTOSIS REPAIR Bilateral 02/18/2016   Procedure: BILATERAL PTOSIS REPAIR UPPER EYELIDS;  Surgeon: Karle Starch, MD;  Location: [place];  Service: Ophthalmology;  Laterality: Bilateral;  LEAVE PT EARLY AM  . thumb surgery Right   . TONSILLECTOMY  1973   Social History  Substance Use Topics  . Smoking status: Current Some Day Smoker    Packs/day: 0.50    Years: 25.00    Types: Cigarettes    Last attempt to quit: 11/16/2012  . Smokeless tobacco: Never Used     Comment: recenty 04/26/2014  . Alcohol use No   Family History  Problem Relation Age of Onset  . Cancer Father     Abdomen with mastasis  . Cancer Mother   . Heart disease Mother   .  Kidney disease Neg Hx   . Bladder Cancer Neg Hx   . Prostate cancer Neg Hx     Allergies:  Allergies  Allergen Reactions  . Albuterol Shortness Of Breath and Other (See Comments)    Makes pt feel jittery/ tacycardic  . Halcion [Triazolam] Other (See Comments)    Dizziness,headaches,bladder problems  . Levaquin [Levofloxacin In D5w] Diarrhea and Itching    Shoulder pain  . Naproxen Sodium Swelling    Patient tolerates in small doses  . Tylenol [Acetaminophen] Swelling    Patient tolerates in small doses  . Cefaclor Other (See Comments)    Doesn't remember---unsure if actually allergic   . Diclofenac Sodium Other (See Comments)     "made very sick"  . Sulfa Antibiotics Itching    Unsure of reaction possibly itching  . Tramadol Itching and Nausea And Vomiting  . Aripiprazole Other (See Comments)    Muscle tension/cramping  . Ibuprofen Swelling    Patient tolerates in small doses    Current antibiotics: Antibiotics Given (last 72 hours)    Date/Time Action Medication Dose Rate   10/11/16 1126 Given   cefTRIAXone (ROCEPHIN) IVPB 2 g 2 g 100 mL/hr   10/12/16 1103 Given   cefTRIAXone (ROCEPHIN) IVPB 2 g 2 g 100 mL/hr   10/12/16 1659 Given   ampicillin (OMNIPEN) 1 g in sodium chloride 0.9 % 50 mL IVPB 1 g 150 mL/hr   10/12/16 2208 Given   ampicillin (OMNIPEN) 1 g in sodium chloride 0.9 % 50 mL IVPB 1 g 150 mL/hr   10/13/16 0329 Given   ampicillin (OMNIPEN) 1 g in sodium chloride 0.9 % 50 mL IVPB 1 g 150 mL/hr   10/13/16 0941 Given   ampicillin (OMNIPEN) 1 g in sodium chloride 0.9 % 50 mL IVPB 1 g 150 mL/hr      MEDICATIONS: . ampicillin (OMNIPEN) IV  1 g Intravenous Q6H  . [START ON 11/16/2016] cyanocobalamin  1,000 mcg Intramuscular Q8 Weeks  . docusate sodium  100 mg Oral BID  . DULoxetine  40 mg Oral QHS  . fluticasone  1 spray Each Nare Daily  . heparin  5,000 Units Subcutaneous Q8H  . hydrOXYzine  100 mg Oral QHS  . lamoTRIgine  300 mg Oral QHS  . loratadine  10 mg Oral Daily  . mirabegron ER  25 mg Oral QHS  . nicotine  21 mg Transdermal Daily  . sodium chloride flush  3 mL Intravenous Q12H  . tamsulosin  0.4 mg Oral QPM  . zolpidem  5 mg Oral QHS    Review of Systems - 11 systems reviewed and negative per HPI   OBJECTIVE: Temp:  [97.8 F (36.6 C)-102.8 F (39.3 C)] 98.6 F (37 C) (11/28 1247) Pulse Rate:  [68-108] 68 (11/28 1247) Resp:  [16-20] 19 (11/28 0938) BP: (104-152)/(62-84) 120/84 (11/28 1247) SpO2:  [96 %-99 %] 96 % (11/28 1247) Physical Exam  Constitutional:  oriented to person, place, and time. appears well-developed and well-nourished. No distress. obese HENT: [place]/AT, PERRLA,  no scleral icterus Mouth/Throat: Oropharynx is clear and moist. No oropharyngeal exudate.  Cardiovascular: Normal rate, regular rhythm and normal heart sounds. Exam reveals no gallop and no friction rub.  No murmur heard.  Pulmonary/Chest: Effort normal and breath sounds normal. No respiratory distress.  has no wheezes.  Neck = supple, no nuchal rigidity Abdominal: Soft. Bowel sounds are normal.  exhibits no distension. There is no tenderness.  Lymphadenopathy: no cervical adenopathy. No axillary adenopathy  Neurological: alert and oriented to person, place, and time.  Skin: Skin is warm and dry. No rash noted. No erythema.  Psychiatric: a normal mood and affect.  behavior is normal.    LABS: Results for orders placed or performed during the hospital encounter of 10/10/16 (from the past 48 hour(s))  Basic metabolic panel     Status: Abnormal   Collection Time: 10/13/16  4:46 AM  Result Value Ref Range   Sodium 140 135 - 145 mmol/L   Potassium 3.5 3.5 - 5.1 mmol/L   Chloride 111 101 - 111 mmol/L   CO2 25 22 - 32 mmol/L   Glucose, Bld 171 (H) 65 - 99 mg/dL   BUN 7 6 - 20 mg/dL   Creatinine, Ser 0.70 0.44 - 1.00 mg/dL   Calcium 8.0 (L) 8.9 - 10.3 mg/dL   GFR calc non Af Amer >60 >60 mL/min   GFR calc Af Amer >60 >60 mL/min    Comment: (NOTE) The eGFR has been calculated using the CKD EPI equation. This calculation has not been validated in all clinical situations. eGFR's persistently <60 mL/min signify possible Chronic Kidney Disease.    Anion gap 4 (L) 5 - 15   No components found for: ESR, C REACTIVE PROTEIN MICRO: Recent Results (from the past 720 hour(s))  CULTURE, URINE COMPREHENSIVE     Status: None   Collection Time: 09/16/16  3:00 PM  Result Value Ref Range Status   Urine Culture, Comprehensive Final report  Final   Result 1 Comment  Final    Comment: Mixed urogenital flora 5,000  Colonies/mL   Microscopic Examination     Status: Abnormal   Collection Time:  09/16/16  3:00 PM  Result Value Ref Range Status   WBC, UA >30 (A) 0 - 5 /hpf Final   RBC, UA 3-10 (A) 0 - 2 /hpf Final   Epithelial Cells (non renal) 0-10 0 - 10 /hpf Final   Bacteria, UA Few None seen/Few Final  Microscopic Examination     Status: Abnormal   Collection Time: 10/07/16 10:39 AM  Result Value Ref Range Status   WBC, UA >30 (A) 0 - 5 /hpf Final   RBC, UA >30 (A) 0 - 2 /hpf Final   Epithelial Cells (non renal) None seen 0 - 10 /hpf Final   Bacteria, UA None seen None seen/Few Final  Blood Culture (routine x 2)     Status: None (Preliminary result)   Collection Time: 10/10/16 11:54 AM  Result Value Ref Range Status   Specimen Description BLOOD RIGHT ARM  Final   Special Requests BOTTLES DRAWN AEROBIC AND ANAEROBIC 12CC  Final   Culture NO GROWTH 3 DAYS  Final   Report Status PENDING  Incomplete  Blood Culture (routine x 2)     Status: None (Preliminary result)   Collection Time: 10/10/16 11:54 AM  Result Value Ref Range Status   Specimen Description BLOOD LEFT ARM  Final   Special Requests BOTTLES DRAWN AEROBIC AND ANAEROBIC 10CC  Final   Culture NO GROWTH 3 DAYS  Final   Report Status PENDING  Incomplete  Urine culture     Status: Abnormal   Collection Time: 10/10/16 11:54 AM  Result Value Ref Range Status   Specimen Description URINE, RANDOM  Final   Special Requests NONE  Final   Culture 20,000 COLONIES/mL ENTEROCOCCUS FAECALIS (A)  Final   Report Status 10/13/2016 FINAL  Final   Organism ID, Bacteria ENTEROCOCCUS FAECALIS (A)  Final      Susceptibility   Enterococcus faecalis - MIC*    AMPICILLIN <=2 SENSITIVE Sensitive     LEVOFLOXACIN 1 SENSITIVE Sensitive     NITROFURANTOIN <=16 SENSITIVE Sensitive     VANCOMYCIN 1 SENSITIVE Sensitive     * 20,000 COLONIES/mL ENTEROCOCCUS FAECALIS    IMAGING: US Renal  Result Date: 10/11/2016 CLINICAL DATA:  Acute renal failure EXAM: RENAL / URINARY TRACT ULTRASOUND COMPLETE COMPARISON:  10/10/2016 FINDINGS: Right  Kidney: Length: 11.7 cm in length. No hydronephrosis. Normal echogenicity. Nonobstructive calculus in lower pole measures 5.6 mm. Left Kidney: Length: 12.9 cm. Echogenicity within normal limits. No mass or hydronephrosis visualized. Bladder: Appears normal for degree of bladder distention. IMPRESSION: 1. There is right nonobstructive nephrolithiasis. No hydronephrosis. Under distended urinary bladder. Electronically Signed   By: Lahoma Crocker M.D.   On: 10/11/2016 12:14   Dg Chest Port 1 View  Result Date: 10/10/2016 CLINICAL DATA:  Fevers following ureteral stent removal EXAM: PORTABLE CHEST 1 VIEW COMPARISON:  01/17/2011 FINDINGS: The heart size and mediastinal contours are within normal limits. Both lungs are clear. The visualized skeletal structures are unremarkable. IMPRESSION: No active disease. Electronically Signed   By: Inez Catalina M.D.   On: 10/10/2016 12:17   Ct Renal Stone Study  Result Date: 10/10/2016 CLINICAL DATA:  Bilateral flank pain EXAM: CT ABDOMEN AND PELVIS WITHOUT CONTRAST TECHNIQUE: Multidetector CT imaging of the abdomen and pelvis was performed following the standard protocol without IV contrast. COMPARISON:  None. FINDINGS: Lower chest: No acute abnormality. Hepatobiliary: No focal liver abnormality is seen. No gallstones, gallbladder wall thickening, or biliary dilatation. Pancreas: Unremarkable. No pancreatic ductal dilatation or surrounding inflammatory changes. Spleen: Normal in size without focal abnormality. Adrenals/Urinary Tract: The adrenal glands are within normal limits. The left kidney shows no evidence of renal calculi. A small 3-4 mm nonobstructing stone is noted in the lower pole of the right kidney. No ureteral calculi are seen. The bladder is partially decompressed. Stomach/Bowel: Stomach is within normal limits. Appendix appears normal. No evidence of bowel wall thickening, distention, or inflammatory changes. Scattered diverticular change is noted without  diverticulitis. Vascular/Lymphatic: No significant vascular findings are present. No enlarged abdominal or pelvic lymph nodes. Reproductive: Uterus and bilateral adnexa are unremarkable. Other: No abdominal wall hernia or abnormality. No abdominopelvic ascites. Musculoskeletal: Left hip replacement is noted. No acute bony abnormality is seen. Degenerative changes of the lumbar spine are noted. Anterolisthesis of L4 on L5 is noted of a degenerative basis. IMPRESSION: Nonobstructing right renal stone. No acute abnormality is identified. Electronically Signed   By: Inez Catalina M.D.   On: 10/10/2016 15:10    Assessment:   VANDA WASKEY is a 55 y.o. female with pyelonephritis following removal of bil ureteral stents on 11/22. UCX with enterococcus sensitive to ampicillin.  Was just started on ampicillin on 11/26.  Seems to have defervesced since on appropriate abx. CT shows non obs R renal stone. She has MS as well.   Recommendations Would monitor overnight and if afebrile dc on a 21 day total course of oral amoxicillin 500 mg bid until 12/18 I can see in 2 weeks to follow up Thank you very much for allowing me to participate in the care of this patient. Please call with questions.   Cheral Marker. Ola Spurr, MD

## 2016-10-13 NOTE — Progress Notes (Signed)
[facility] [place] NAME: Emily Moon    MRN#:  [id number]  DATE OF BIRTH:  1961-06-30  SUBJECTIVE:  Hospital Day: 3 days Emily Moon is a 55 y.o. female presenting with Fever .  Overnight events: Febrile overnight Interval Events: No current complaints  REVIEW OF SYSTEMS:  CONSTITUTIONAL: Positive  fever, fatigue or weakness.  EYES: No blurred or double vision.  EARS, NOSE, AND THROAT: No tinnitus or ear pain.  RESPIRATORY: No cough, shortness of breath, wheezing or hemoptysis.  CARDIOVASCULAR: No chest pain, orthopnea, edema.  GASTROINTESTINAL: No nausea, vomiting, diarrhea or abdominal pain.  GENITOURINARY: No dysuria, hematuria.  ENDOCRINE: No polyuria, nocturia,  HEMATOLOGY: No anemia, easy bruising or bleeding SKIN: No rash or lesion. MUSCULOSKELETAL: No joint pain or arthritis.   NEUROLOGIC: No tingling, numbness, weakness.  PSYCHIATRY: No anxiety or depression.   DRUG ALLERGIES:   Allergies  Allergen Reactions  . Albuterol Shortness Of Breath and Other (See Comments)    Makes pt feel jittery/ tacycardic  . Halcion [Triazolam] Other (See Comments)    Dizziness,headaches,bladder problems  . Levaquin [Levofloxacin In D5w] Diarrhea and Itching    Shoulder pain  . Naproxen Sodium Swelling    Patient tolerates in small doses  . Tylenol [Acetaminophen] Swelling    Patient tolerates in small doses  . Cefaclor Other (See Comments)    Doesn't remember---unsure if actually allergic   . Diclofenac Sodium Other (See Comments)    "made very sick"  . Sulfa Antibiotics Itching    Unsure of reaction possibly itching  . Tramadol Itching and Nausea And Vomiting  . Aripiprazole Other (See Comments)    Muscle tension/cramping  . Ibuprofen Swelling    Patient tolerates in small doses    VITALS:  Blood pressure 120/84, pulse 68, temperature 98.8 F (37.1 C), resp. rate 19, height 5\' 9"  (1.753 m), weight 94.3 kg  (208 lb), last menstrual period 08/23/2014, SpO2 96 %.  PHYSICAL EXAMINATION:  VITAL SIGNS: Vitals:   10/13/16 1247 10/13/16 1608  BP:    Pulse:    Resp:    Temp: 98.6 F (37 C) 98.8 F (37.1 C)   GENERAL:55 y.o.female currently in no acute distress.  HEAD: Normocephalic, atraumatic.  EYES: Pupils equal, round, reactive to light. Extraocular muscles intact. No scleral icterus.  MOUTH: Moist mucosal membrane. Dentition intact. No abscess noted.  EAR, NOSE, THROAT: Clear without exudates. No external lesions.  NECK: Supple. No thyromegaly. No nodules. No JVD.  PULMONARY: Clear to ascultation, without wheeze rails or rhonci. No use of accessory muscles, Good respiratory effort. good air entry bilaterally CHEST: Nontender to palpation.  CARDIOVASCULAR: S1 and S2. Regular rate and rhythm. No murmurs, rubs, or gallops. No edema. Pedal pulses 2+ bilaterally.  GASTROINTESTINAL: Soft, nontender, nondistended. No masses. Positive bowel sounds. No hepatosplenomegaly.  MUSCULOSKELETAL: No swelling, clubbing, or edema. Range of motion full in all extremities.  NEUROLOGIC: Cranial nerves II through XII are intact. No gross focal neurological deficits. Sensation intact. Reflexes intact.  SKIN: No ulceration, lesions, rashes, or cyanosis. Skin warm and dry. Turgor intact.  PSYCHIATRIC: Mood, affect within normal limits. The patient is awake, alert and oriented x 3. Insight, judgment intact.      LABORATORY PANEL:   CBC  Recent Labs Lab 10/11/16 0708  WBC 12.7*  HGB 11.3*  HCT 33.5*  PLT 232   ------------------------------------------------------------------------------------------------------------------  Chemistries   Recent Labs Lab 10/10/16 1154  10/13/16 0446  NA 134*  < >  140  K 3.7  < > 3.5  CL 97*  < > 111  CO2 27  < > 25  GLUCOSE 95  < > 171*  BUN 10  < > 7  CREATININE 0.95  < > 0.70  CALCIUM 8.9  < > 8.0*  AST 15  --   --   ALT 11*  --   --   ALKPHOS 72  --   --    BILITOT 0.9  --   --   < > = values in this interval not displayed. ------------------------------------------------------------------------------------------------------------------  Cardiac Enzymes No results for input(s): TROPONINI in the last 168 hours. ------------------------------------------------------------------------------------------------------------------  RADIOLOGY:  No results found. ASSESSMENT AND PLAN:   Emily Moon is a 55 y.o. female presenting with Fever . Admitted 10/10/2016 : Day #: 3 days 1. Sepsis: Meeting septic criteria on admission, likely bacteremia versus urinary source -UC growing enterococcus -change to IV ampicillin. D/w pharmacy---ID input noted and appreciated. Recommends 21 days of oral ampicillin. F/u ID in 2 weeks -appreciate dr Cherrie Gauze input  2. Headache: Supportive measures  3. Nephrolithiasis/ureterolithiasis -s/p bilateral stent placement on 09/22/16 and removal on 10/07/16  D/c in am if remains stable   All the records are reviewed and case discussed with Care Management/Social Workerr. Management plans discussed with the patient, family and they are in agreement.  CODE STATUS: full TOTAL TIME TAKING CARE OF THIS PATIENT: 28 minutes.   POSSIBLE D/C IN 1-2DAYS, DEPENDING ON CLINICAL CONDITION.   Emily Moon M.D on 10/13/2016 at 5:09 PM  Between 7am to 6pm - Pager - [phone]  After 6pm: House Pager: - [phone]  [facility]  Office  [phone]  CC: Primary care physician; Sheral Flow, NP

## 2016-10-13 NOTE — Telephone Encounter (Signed)
Per pharmacy pt is. Pt did not answer phone. Saw that pt has been admitted.

## 2016-10-14 DIAGNOSIS — N390 Urinary tract infection, site not specified: Secondary | ICD-10-CM | POA: Diagnosis not present

## 2016-10-14 DIAGNOSIS — A419 Sepsis, unspecified organism: Secondary | ICD-10-CM | POA: Diagnosis not present

## 2016-10-14 DIAGNOSIS — R509 Fever, unspecified: Secondary | ICD-10-CM | POA: Diagnosis not present

## 2016-10-14 DIAGNOSIS — E538 Deficiency of other specified B group vitamins: Secondary | ICD-10-CM | POA: Diagnosis not present

## 2016-10-14 DIAGNOSIS — Z716 Tobacco abuse counseling: Secondary | ICD-10-CM | POA: Diagnosis not present

## 2016-10-14 DIAGNOSIS — R109 Unspecified abdominal pain: Secondary | ICD-10-CM | POA: Diagnosis not present

## 2016-10-14 DIAGNOSIS — E871 Hypo-osmolality and hyponatremia: Secondary | ICD-10-CM | POA: Diagnosis not present

## 2016-10-14 LAB — CBC
HCT: 29.2 % — ABNORMAL LOW (ref 35.0–47.0)
Hemoglobin: 9.9 g/dL — ABNORMAL LOW (ref 12.0–16.0)
MCH: 31.7 pg (ref 26.0–34.0)
MCHC: 33.9 g/dL (ref 32.0–36.0)
MCV: 93.5 fL (ref 80.0–100.0)
PLATELETS: 275 10*3/uL (ref 150–440)
RBC: 3.13 MIL/uL — AB (ref 3.80–5.20)
RDW: 13.2 % (ref 11.5–14.5)
WBC: 7.2 10*3/uL (ref 3.6–11.0)

## 2016-10-14 MED ORDER — AMOXICILLIN 500 MG PO CAPS: 500.0000 mg | ORAL_CAPSULE | Freq: Three times a day (TID) | ORAL | 0 refills | Status: DC

## 2016-10-14 MED ORDER — IBUPROFEN 400 MG PO TABS
400.0000 mg | ORAL_TABLET | Freq: Once | ORAL | Status: AC
  Administered 2016-10-14: 400 mg via ORAL
  Filled 2016-10-14: qty 1

## 2016-10-14 MED ORDER — FLUCONAZOLE 150 MG PO TABS: 150.0000 mg | ORAL_TABLET | ORAL | 0 refills | Status: DC

## 2016-10-14 NOTE — Discharge Instructions (Signed)
Pyelonephritis, Adult °Introduction °Pyelonephritis is a kidney infection. The kidneys are organs that help clean your blood by moving waste out of your blood and into your pee (urine). This infection can happen quickly, or it can last for a long time. In most cases, it clears up with treatment and does not cause other problems. °Follow these instructions at home: °Medicines °· Take over-the-counter and prescription medicines only as told by your doctor. °· Take your antibiotic medicine as told by your doctor. Do not stop taking the medicine even if you start to feel better. °General instructions °· Drink enough fluid to keep your pee clear or pale yellow. °· Avoid caffeine, tea, and carbonated drinks. °· Pee (urinate) often. Avoid holding in pee for long periods of time. °· Pee before and after sex. °· After pooping (having a bowel movement), women should wipe from front to back. Use each tissue only once. °· Keep all follow-up visits as told by your doctor. This is important. °Contact a doctor if: °· You do not feel better after 2 days. °· Your symptoms get worse. °· You have a fever. °Get help right away if: °· You cannot take your medicine or drink fluids as told. °· You have chills and shaking. °· You throw up (vomit). °· You have very bad pain in your side (flank) or back. °· You feel very weak or you pass out (faint). °This information is not intended to replace advice given to you by your health care provider. Make sure you discuss any questions you have with your health care provider. °Document Released: 12/10/2004 Document Revised: 04/09/2016 Document Reviewed: 02/25/2015 °© 2017 Elsevier ° °

## 2016-10-14 NOTE — Progress Notes (Signed)
IV was removed. Discharge instructions and follow-up appointments were provided to the pt. All questions were answered. The pt was is waiting on sister to arrive for transport.

## 2016-10-14 NOTE — Care Management Important Message (Signed)
Important Message  Patient Details  Name: Emily Moon MRN: [id number] Date of Birth: 1961-05-06   Medicare Important Message Given:  Yes    Beverly Sessions, RN 10/14/2016, 1:45 PM

## 2016-10-14 NOTE — Progress Notes (Signed)
10/14/16  Urology consult f/u  S: Patient with low grade fevers through the night.  WBC count down to 7.2 from 12.7 three days ago. Patient reports a temp reading of 102 yesterday afternoon right before shift change,  but the reading was transferred to the chart.  A temp rechecked very soon after had a reading of 101.2   BP at baseline.  No complaints this AM.    States urinating without difficulty.    O: Vitals:   10/14/16 0528 10/14/16 0808  BP: 135/81 105/64  Pulse: 93 70  Resp: 18 16  Temp: 100.2 F (37.9 C) 98.7 F (37.1 C)    Intake/Output Summary (Last 24 hours) at 10/14/16 0815 Last data filed at 10/14/16 0400  Gross per 24 hour  Intake           820.11 ml  Output                0 ml  Net           820.11 ml  x 3 more voids   Constitutional: Well nourished. Alert and oriented, No acute distress. HEENT:  AT, moist mucus membranes. Trachea midline, no masses. Cardiovascular: No clubbing, cyanosis, or edema. Respiratory: Normal respiratory effort, no increased work of breathing. GI: Abdomen is soft, non tender, non distended, no abdominal masses. Liver and spleen not palpable.  No hernias appreciated.  Stool sample for occult testing is not indicated.   GU: No CVA tenderness.  No bladder fullness or masses.   Skin: No rashes, bruises or suspicious lesions. Lymph: No cervical or inguinal adenopathy. Neurologic: Grossly intact, no focal deficits, moving all 4 extremities. Psychiatric: Normal mood and affect.  CBC    Component Value Date/Time   WBC 7.2 10/14/2016 0444   RBC 3.13 (L) 10/14/2016 0444   HGB 9.9 (L) 10/14/2016 0444   HCT 29.2 (L) 10/14/2016 0444   PLT 275 10/14/2016 0444   MCV 93.5 10/14/2016 0444   MCH 31.7 10/14/2016 0444   MCHC 33.9 10/14/2016 0444   RDW 13.2 10/14/2016 0444   LYMPHSABS 1.0 10/10/2016 1154   MONOABS 0.6 10/10/2016 1154   EOSABS 0.1 10/10/2016 1154   BASOSABS 0.0 10/10/2016 1154   BMET    Component Value Date/Time   NA 140  10/13/2016 0446   K 3.5 10/13/2016 0446   CL 111 10/13/2016 0446   CO2 25 10/13/2016 0446   GLUCOSE 171 (H) 10/13/2016 0446   BUN 7 10/13/2016 0446   CREATININE 0.70 10/13/2016 0446   CALCIUM 8.0 (L) 10/13/2016 0446   GFRNONAA >60 10/13/2016 0446   GFRAA >60 10/13/2016 0446    RUS 10/11/16 CLINICAL DATA:  Acute renal failure  EXAM: RENAL / URINARY TRACT ULTRASOUND COMPLETE  COMPARISON:  10/10/2016  FINDINGS: Right Kidney:  Length: 11.7 cm in length. No hydronephrosis. Normal echogenicity. Nonobstructive calculus in lower pole measures 5.6 mm.  Left Kidney:  Length: 12.9 cm. Echogenicity within normal limits. No mass or hydronephrosis visualized.  Bladder:  Appears normal for degree of bladder distention.  IMPRESSION: 1. There is right nonobstructive nephrolithiasis. No hydronephrosis. Under distended urinary bladder.   Electronically Signed   By: Lahoma Crocker M.D.   On: 10/11/2016 12:14   A/P - 55 yo F with MS admitted with fevers of unclear etiology following bilateral ureteroscopy, stent removal.  Fever curve improving.  Urine and blood cultures negative to date, urine being reintubated for better growth- still in preliminary status - ID recommends 21 days  of ampicillin when discharged  Repeated renal ultrasound without evidence of hydronephrosis, incomplete bladder emptying.  Recommend keeping patient in-house until afebrile  24 hours.  Charae Depaolis, PA-C

## 2016-10-14 NOTE — Progress Notes (Signed)
Pt adamant in wanting to go home despite some fever. Says she is uncomfortable in hospital bed and rather be home.  Per ID, she will need 2 weeks for abx and she is in agreement, requests fluconazole for prophylaxis.

## 2016-10-15 ENCOUNTER — Encounter: Payer: Self-pay | Admitting: Urology

## 2016-10-15 LAB — CULTURE, BLOOD (ROUTINE X 2)
Culture: NO GROWTH
Culture: NO GROWTH

## 2016-10-16 ENCOUNTER — Other Ambulatory Visit: Payer: Self-pay | Admitting: Urology

## 2016-10-16 ENCOUNTER — Telehealth: Payer: Self-pay | Admitting: *Deleted

## 2016-10-16 MED ORDER — MIRABEGRON ER 25 MG PO TB24: 25.0000 mg | ORAL_TABLET | Freq: Every day | ORAL | 12 refills | Status: DC

## 2016-10-16 NOTE — Telephone Encounter (Signed)
Noted  

## 2016-10-16 NOTE — Telephone Encounter (Signed)
Transition Care Management Follow-up Telephone Call   Date discharged? 10/14/16   How have you been since you were released from the hospital? Pt reports she is overall feeling better. Still running low-grade fever at times.    Do you understand why you were in the hospital? yes   Do you understand the discharge instructions? yes   Where were you discharged to? Home   Items Reviewed:  Medications reviewed: yes  Allergies reviewed: yes  Dietary changes reviewed: no, none made per pt  Referrals reviewed: yes, neurology and ID   Functional Questionnaire:   Activities of Daily Living (ADLs):   She states they are independent in the following: ambulation, bathing and hygiene, feeding, continence, grooming, toileting and dressing States they require assistance with the following: none   Any transportation issues/concerns?: no   Any patient concerns? yes, pt would like to review lab results. States she was dx w/ CKD, but I do not see that on Problem List or discharge AVS.   Confirmed importance and date/time of follow-up visits scheduled yes  Provider Appointment booked with [name], NP 10/19/16 @ 12:30pm.  Confirmed with patient if condition begins to worsen call PCP or go to the ER.  Patient was given the office number and encouraged to call back with question or concerns.  : yes

## 2016-10-16 NOTE — Discharge Summary (Signed)
[facility] [place] NAME: Emily Moon    MR#:  [id number]  DATE OF BIRTH:  [birth year]  DATE OF ADMISSION:  10/10/2016   ADMITTING PHYSICIAN: Demetrios Loll, MD  DATE OF DISCHARGE: 10/14/2016  2:17 PM  PRIMARY CARE PHYSICIAN: Sheral Flow, NP   ADMISSION DIAGNOSIS:  B12 deficiency [E53.8] Flank pain, acute [R10.9] Sepsis, due to unspecified organism ([place]) [A41.9] DISCHARGE DIAGNOSIS:  Active Problems:   Sepsis ([place])  SECONDARY DIAGNOSIS:   Past Medical History:  Diagnosis Date  . Arthritis    osteo  . Asthma   . Bipolar disorder ([place]) 05/21/14  . Cataracts, bilateral   . DDD (degenerative disc disease), cervical    also back  . Depression   . Dizziness    Positional  . Edema    feet/legs  . Fibromyalgia syndrome   . Fungal infection    Finger nails  . GERD (gastroesophageal reflux disease)   . Gout   . Headache    seasonal allergies  . Heart palpitations   . 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 Hypercholesterolemia   . Multiple sclerosis (HCC)    weakness  . Nephrolithiasis    kidney stones  . Osteoporosis    osteoarthritis  . Pneumonia   . PONV (postoperative nausea and vomiting)    no problem after cataract surgery  . Psoriasis   . Renal stone   . Shortness of breath dyspnea    wheezing  . Sleep apnea 2012   sleep study / slight, no interventions  . Urinary frequency    HOSPITAL COURSE:  Emily Moon is a 55 y.o. female admitted with Fever  1. Sepsis: present on admission due to pyelonephritis -UC growing enterococcus - improving with abx. - seen by Urology and ID and recommended 21 days of total course of Amoxycillin.  2. Headache: Supportive measures  3. Nephrolithiasis/ureterolithiasis -s/p bilateral stent placement on 09/22/16 and removal on 10/07/16  DISCHARGE CONDITIONS:  stable CONSULTS OBTAINED:  Treatment Team:  Hollice Espy, MD Leonel Ramsay, MD DRUG  ALLERGIES:   Allergies  Allergen Reactions  . Albuterol Shortness Of Breath and Other (See Comments)    Makes pt feel jittery/ tacycardic  . Halcion [Triazolam] Other (See Comments)    Dizziness,headaches,bladder problems  . Levaquin [Levofloxacin In D5w] Diarrhea and Itching    Shoulder pain  . Naproxen Sodium Swelling    Patient tolerates in small doses  . Tylenol [Acetaminophen] Swelling    Patient tolerates in small doses  . Cefaclor Other (See Comments)    Doesn't remember---unsure if actually allergic   . Diclofenac Sodium Other (See Comments)    "made very sick"  . Sulfa Antibiotics Itching    Unsure of reaction possibly itching  . Tramadol Itching and Nausea And Vomiting  . Aripiprazole Other (See Comments)    Muscle tension/cramping  . Ibuprofen Swelling    Patient tolerates in small doses   DISCHARGE MEDICATIONS:     Medication List    TAKE these medications   amoxicillin 500 MG capsule Commonly known as:  AMOXIL Take 1 capsule (500 mg total) by mouth 3 (three) times daily.   ASPERCREME LIDOCAINE EX Apply 1 application topically 4 (four) times daily as needed (for pain.).   aspirin 325 MG tablet Take 325 mg by mouth every 4 (four) hours as needed for headache.   Biotin 5000 MCG Tabs Take 10,000 mcg by mouth every evening.   calcipotriene-betamethasone ointment Commonly  known as:  TACLONEX Apply 1 application topically daily as needed (for psorasis).   clobetasol cream 0.05 % Commonly known as:  TEMOVATE Apply 1 application topically 2 (two) times daily. What changed:  when to take this  reasons to take this   cyanocobalamin 1000 MCG/ML injection Commonly known as:  (VITAMIN B-12) Inject 1,000 mcg into the muscle every 8 (eight) weeks. Every other month   diclofenac sodium 1 % Gel Commonly known as:  VOLTAREN Apply 4 g topically 4 (four) times daily. What changed:  when to take this  reasons to take this   docusate sodium 100 MG  capsule Commonly known as:  COLACE Take 1 capsule (100 mg total) by mouth 2 (two) times daily. What changed:  when to take this  reasons to take this   DULoxetine 20 MG capsule Commonly known as:  CYMBALTA Take 40 mg by mouth at bedtime.   fluconazole 150 MG tablet Commonly known as:  DIFLUCAN Take 1 tablet (150 mg total) by mouth every 3 (three) days.   HYDROcodone-acetaminophen 10-325 MG tablet Commonly known as:  NORCO Take 1 tablet by mouth every 8 (eight) hours as needed for severe pain.   hydroquinone 4 % cream 1 APPLICATION TOPICALLY DAILY AS NEEDED FOR BLEMISHES.   hydrOXYzine 50 MG capsule Commonly known as:  VISTARIL Take 100 mg by mouth at bedtime.   lamoTRIgine 150 MG tablet Commonly known as:  LAMICTAL Take two tablets by mouth daily What changed:  how much to take  how to take this  when to take this  additional instructions   levalbuterol 45 MCG/ACT inhaler Commonly known as:  XOPENEX HFA Inhale 1-2 puffs into the lungs every 6 (six) hours as needed for wheezing.   Lysine 1000 MG Tabs Take 2,000-4,000 mg by mouth at bedtime. 2000 mg scheduled at bedtime and patient will take 4000 mg if she has outbreak   neomycin-bacitracin-polymyxin [phone] ointment Apply 1 application topically 4 (four) times daily as needed (for cut/scrapes.).   nicotine 21 mg/24hr patch Commonly known as:  NICODERM CQ - dosed in mg/24 hours Place 1 patch (21 mg total) onto the skin daily.   ondansetron 4 MG disintegrating tablet Commonly known as:  ZOFRAN ODT Take 1 tablet (4 mg total) by mouth every 8 (eight) hours as needed for nausea.   polyethylene glycol powder powder Commonly known as:  GLYCOLAX/MIRALAX MIX 17 GRAMS (1 CAPFUL) WITH 4-8 OZ OF LIQUID AND TAKE BY MOUTH TWICE DAILY AS NEEDED What changed:  See the new instructions.   REFRESH TEARS 0.5 % Soln Generic drug:  carboxymethylcellulose Place 1-2 drops into both eyes 3 (three) times daily as needed.    tamsulosin 0.4 MG Caps capsule Commonly known as:  FLOMAX TAKE 1 CAPSULE (0.4 MG TOTAL) BY MOUTH DAILY.   temazepam 30 MG capsule Commonly known as:  RESTORIL TAKE ONE TO TWO CAPSULES BY MOUTH NIGHTLY AT BEDTIME AS NEEDED What changed:  See the new instructions.   tiZANidine 4 MG tablet Commonly known as:  ZANAFLEX TAKE 2 TABLETS (8 MG TOTAL) BY MOUTH 3 (THREE) TIMES DAILY. What changed:  See the new instructions.   zolpidem 10 MG tablet Commonly known as:  AMBIEN Take 10 mg by mouth at bedtime.        DISCHARGE INSTRUCTIONS:   DIET:  Regular diet DISCHARGE CONDITION:  Good ACTIVITY:  Activity as tolerated OXYGEN:  Home Oxygen: No.  Oxygen Delivery: room air DISCHARGE LOCATION:  home   If  you experience worsening of your admission symptoms, develop shortness of breath, life threatening emergency, suicidal or homicidal thoughts you must seek medical attention immediately by calling 911 or calling your MD immediately  if symptoms less severe.  You Must read complete instructions/literature along with all the possible adverse reactions/side effects for all the Medicines you take and that have been prescribed to you. Take any new Medicines after you have completely understood and accpet all the possible adverse reactions/side effects.   Please note  You were cared for by a hospitalist during your hospital stay. If you have any questions about your discharge medications or the care you received while you were in the hospital after you are discharged, you can call the unit and asked to speak with the hospitalist on call if the hospitalist that took care of you is not available. Once you are discharged, your primary care physician will handle any further medical issues. Please note that NO REFILLS for any discharge medications will be authorized once you are discharged, as it is imperative that you return to your primary care physician (or establish a relationship with a primary  care physician if you do not have one) for your aftercare needs so that they can reassess your need for medications and monitor your lab values.    On the day of Discharge:  VITAL SIGNS:  Blood pressure 105/64, pulse 70, temperature 98.7 F (37.1 C), temperature source Oral, resp. rate 16, height 5\' 9"  (1.753 m), weight 94.3 kg (208 lb), last menstrual period 08/23/2014, SpO2 97 %. PHYSICAL EXAMINATION:  GENERAL:  55 y.o.-year-old patient lying in the bed with no acute distress.  EYES: Pupils equal, round, reactive to light and accommodation. No scleral icterus. Extraocular muscles intact.  HEENT: Head atraumatic, normocephalic. Oropharynx and nasopharynx clear.  NECK:  Supple, no jugular venous distention. No thyroid enlargement, no tenderness.  LUNGS: Normal breath sounds bilaterally, no wheezing, rales,rhonchi or crepitation. No use of accessory muscles of respiration.  CARDIOVASCULAR: S1, S2 normal. No murmurs, rubs, or gallops.  ABDOMEN: Soft, non-tender, non-distended. Bowel sounds present. No organomegaly or mass.  EXTREMITIES: No pedal edema, cyanosis, or clubbing.  NEUROLOGIC: Cranial nerves II through XII are intact. Muscle strength 5/5 in all extremities. Sensation intact. Gait not checked.  PSYCHIATRIC: The patient is alert and oriented x 3.  SKIN: No obvious rash, lesion, or ulcer.  DATA REVIEW:   CBC  Recent Labs Lab 10/14/16 0444  WBC 7.2  HGB 9.9*  HCT 29.2*  PLT 275    Chemistries   Recent Labs Lab 10/10/16 1154  10/13/16 0446  NA 134*  < > 140  K 3.7  < > 3.5  CL 97*  < > 111  CO2 27  < > 25  GLUCOSE 95  < > 171*  BUN 10  < > 7  CREATININE 0.95  < > 0.70  CALCIUM 8.9  < > 8.0*  AST 15  --   --   ALT 11*  --   --   ALKPHOS 72  --   --   BILITOT 0.9  --   --   < > = values in this interval not displayed.   Follow-up Information    Sheral Flow, NP. Schedule an appointment as soon as possible for a visit in 1 week(s).   Specialty:   Internal Medicine Contact information: [place] [address] [phone]        Leonel Ramsay, MD. Schedule  an appointment as soon as possible for a visit in 2 week(s).   Specialty:  Infectious Diseases Contact information: [place] [address] [phone]        Festus Aloe, MD. Schedule an appointment as soon as possible for a visit in 3 week(s).   Specialty:  Urology Contact information: [place] [address] [phone]            Management plans discussed with the patient, family and they are in agreement.  CODE STATUS: FULL CODE  TOTAL TIME TAKING CARE OF THIS PATIENT: 45 minutes.    Max Sane M.D on 10/16/2016 at 6:53 PM  Between 7am to 6pm - Pager - [phone]  After 6pm go to [email or web address] - Proofreader  [facility]  Office  [phone]  CC: Primary care physician; Sheral Flow, NP Hollice Espy, MD  Note: This dictation was prepared with Dragon dictation along with smaller phrase technology. Any transcriptional errors that result from this process are unintentional.

## 2016-10-16 NOTE — Telephone Encounter (Signed)
[facility] is requesting a script fir Mrybetriq to be called in. Please advise. I don't have a triage nurse here today to handle this. Maybe it can be e-scribed   Sharyn Lull

## 2016-10-16 NOTE — Telephone Encounter (Signed)
Disregard this message patient states that she got this script from a doctor in [facility] today   [place]

## 2016-10-16 NOTE — Telephone Encounter (Signed)
Script for Myrbetriq sent to [facility].

## 2016-10-18 LAB — CULTURE, BLOOD (ROUTINE X 2)
Culture: NO GROWTH
Culture: NO GROWTH

## 2016-10-19 ENCOUNTER — Encounter: Payer: Self-pay | Admitting: Primary Care

## 2016-10-19 ENCOUNTER — Ambulatory Visit (INDEPENDENT_AMBULATORY_CARE_PROVIDER_SITE_OTHER): Payer: PPO | Admitting: Primary Care

## 2016-10-19 VITALS — BP 122/78 | HR 93 | Temp 99.0°F | Ht 69.0 in | Wt 208.8 lb

## 2016-10-19 DIAGNOSIS — T814XXD Infection following a procedure, subsequent encounter: Secondary | ICD-10-CM

## 2016-10-19 DIAGNOSIS — Z09 Encounter for follow-up examination after completed treatment for conditions other than malignant neoplasm: Secondary | ICD-10-CM

## 2016-10-19 DIAGNOSIS — R109 Unspecified abdominal pain: Secondary | ICD-10-CM | POA: Diagnosis not present

## 2016-10-19 DIAGNOSIS — R509 Fever, unspecified: Secondary | ICD-10-CM | POA: Diagnosis not present

## 2016-10-19 DIAGNOSIS — A419 Sepsis, unspecified organism: Secondary | ICD-10-CM

## 2016-10-19 NOTE — Patient Instructions (Signed)
Follow up as scheduled in January.   Follow up with Infectious disease and Urology as scheduled.   We will repeat Vitamin B 12, Kidney function.   It was a pleasure to see you today!

## 2016-10-19 NOTE — Progress Notes (Signed)
Subjective:    Patient ID: Emily Moon, female    DOB: [birth year], 55 y.o.   MRN: [id number]  HPI  Emily Moon is a 55 year old female who presents today for TCM hospital follow up.  She has a history of several renal stones with stent placement on 09/22/16, with removal of stents on 10/07/16. She presented to [facility] ED on 10/10/16 with a 1 day history of fevers, chills, dysuria, left lower back pain.   During her stay in the ED she underwent treatment with CT without evidence of obstruction. She was found to be febrile (103.2), tachycardic, and with leukocytosis. She was treated with IV Rocephin and was admitted for sepsis and for further evaluation/treatment.  During her hospitalization she was treated with IV fluids, increased dose of Rocephin 2gm. She underwent blood culture testing which was negative. She continued to run fevers. She was consulted by Urology who recommended repeat renal ultrasound which was negative for hydronephrosis, incomplete bladder emptying. She was switched to IV Ampicillin given continued fevers. She was consulted by infectious disease who recommend to continue Ampicillin and requested to see her in the outpatient setting. Her WBC continued to decline and her fevers reduced by 11/29.  She was discharged home on 10/14/16 and was placed on a 21 day course of Amoxicillin per Infectious Disease. She was instructed to schedule an appointment with infectious disease and Urology post discharge.   Since her discharge home she's scheduled an appointment with infectious disease (11/02/16) and urology (10/29/16). She's been compliant to her amoxicillin and thinks she may be developing thrush. She does have a prescription for Diflucan for which she's not taken. She has been running low grade fevers of 100 with improvement after taking Advil. She developed headaches in the hospital which have improved since discharge. Overall she's feeling better. She's able to take in oral  food and fluids.She denies dysuria, hematuria, weakness, nausea, abdominal pain. She plans on traveling to [place] in late December to see her son for Christmas.  Review of Systems  Constitutional: Positive for chills and fever.  Gastrointestinal: Negative for abdominal pain and nausea.  Genitourinary: Positive for flank pain. Negative for dysuria, frequency, hematuria and vaginal discharge.  Neurological: Negative for weakness.       Past Medical History:  Diagnosis Date  . Arthritis    osteo  . Asthma   . Bipolar disorder ([place]) 05/21/14  . Cataracts, bilateral   . DDD (degenerative disc disease), cervical    also back  . Depression   . Dizziness    Positional  . Edema    feet/legs  . Fibromyalgia syndrome   . Fungal infection    Finger nails  . GERD (gastroesophageal reflux disease)   . Gout   . Headache    seasonal allergies  . Heart palpitations   . 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 Hypercholesterolemia   . Multiple sclerosis (HCC)    weakness  . Nephrolithiasis    kidney stones  . Osteoporosis    osteoarthritis  . Pneumonia   . PONV (postoperative nausea and vomiting)    no problem after cataract surgery  . Psoriasis   . Renal stone   . Shortness of breath dyspnea    wheezing  . Sleep apnea 2012   sleep study / slight, no interventions  . Urinary frequency      Social History   Social History  . Marital status: Divorced    Spouse name: N/A  .  Number of children: 1  . Years of education: N/A   Occupational History  . Customer Service Rep at [employer]  . Smoking status: Current Some Day Smoker    Packs/day: 0.50    Years: 25.00    Types: Cigarettes    Last attempt to quit: 11/16/2012  . Smokeless tobacco: Never Used     Comment: recenty 04/26/2014  . Alcohol use No  . Drug use: No  . Sexual activity: No   Other Topics Concern  . Not on file   Social History Narrative  . No narrative on file     Past Surgical History:  Procedure Laterality Date  . CATARACT EXTRACTION W/PHACO Left 05/21/2015   Procedure: CATARACT EXTRACTION PHACO AND INTRAOCULAR LENS PLACEMENT (IOC);  Surgeon: Birder Robson, MD;  Location: [facility] ORS;  Service: Ophthalmology;  Laterality: Left;  [place] 00:35 AP% 22.9 CDE 8.11 fluid pack lot [id number]  . CATARACT EXTRACTION W/PHACO Right 06/04/2015   Procedure: CATARACT EXTRACTION PHACO AND INTRAOCULAR LENS PLACEMENT (IOC);  Surgeon: Birder Robson, MD;  Location: [facility] ORS;  Service: Ophthalmology;  Laterality: Right;  US:00:48 AP%: 10.5 CDE:5.08 Fluid lot [id number]  . CYSTOSCOPY/URETEROSCOPY/HOLMIUM LASER/STENT PLACEMENT Bilateral 09/22/2016   Procedure: CYSTOSCOPY/URETEROSCOPY/HOLMIUM LASER/STENT PLACEMENT;  Surgeon: Hollice Espy, MD;  Location: [facility] ORS;  Service: Urology;  Laterality: Bilateral;  . EYE SURGERY  2015   tissue biopsy  . FOOT SURGERY  2015  . JOINT REPLACEMENT Left 2013   hip replacement  . LITHOTRIPSY    . PTOSIS REPAIR Bilateral 02/18/2016   Procedure: BILATERAL PTOSIS REPAIR UPPER EYELIDS;  Surgeon: Karle Starch, MD;  Location: [facility];  Service: Ophthalmology;  Laterality: Bilateral;  LEAVE PT EARLY AM  . thumb surgery Right   . TONSILLECTOMY  1973    Family History  Problem Relation Age of Onset  . Cancer Father     Abdomen with mastasis  . Cancer Mother   . Heart disease Mother   . Kidney disease Neg Hx   . Bladder Cancer Neg Hx   . Prostate cancer Neg Hx     Allergies  Allergen Reactions  . Albuterol Shortness Of Breath and Other (See Comments)    Makes pt feel jittery/ tacycardic  . Halcion [Triazolam] Other (See Comments)    Dizziness,headaches,bladder problems  . Levaquin [Levofloxacin In D5w] Diarrhea and Itching    Shoulder pain  . Naproxen Sodium Swelling    Patient tolerates in small doses  . Tylenol [Acetaminophen] Swelling    Patient tolerates in small doses  . Cefaclor Other (See Comments)     Doesn't remember---unsure if actually allergic   . Diclofenac Sodium Other (See Comments)    "made very sick"  . Sulfa Antibiotics Itching    Unsure of reaction possibly itching  . Tramadol Itching and Nausea And Vomiting  . Aripiprazole Other (See Comments)    Muscle tension/cramping  . Ibuprofen Swelling    Patient tolerates in small doses    Current Outpatient Prescriptions on File Prior to Visit  Medication Sig Dispense Refill  . amoxicillin (AMOXIL) 500 MG capsule Take 1 capsule (500 mg total) by mouth 3 (three) times daily. 60 capsule 0  . ASPERCREME LIDOCAINE EX Apply 1 application topically 4 (four) times daily as needed (for pain.).     Marland Kitchen aspirin 325 MG tablet Take 325 mg by mouth every 4 (four) hours as needed for headache.     . Biotin 5000 MCG  TABS Take 10,000 mcg by mouth every evening.    . calcipotriene-betamethasone (TACLONEX) ointment Apply 1 application topically daily as needed (for psorasis).     . carboxymethylcellulose (REFRESH TEARS) 0.5 % SOLN Place 1-2 drops into both eyes 3 (three) times daily as needed.    . clobetasol cream (TEMOVATE) [id number] % Apply 1 application topically 2 (two) times daily. (Patient taking differently: Apply 1 application topically 2 (two) times daily as needed (for irriation). ) 30 g 2  . cyanocobalamin (,VITAMIN B-12,) 1000 MCG/ML injection Inject 1,000 mcg into the muscle every 8 (eight) weeks. Every other month    . diclofenac sodium (VOLTAREN) 1 % GEL Apply 4 g topically 4 (four) times daily. (Patient taking differently: Apply 4 g topically 4 (four) times daily as needed (for pain.). ) 500 g 5  . docusate sodium (COLACE) 100 MG capsule Take 1 capsule (100 mg total) by mouth 2 (two) times daily. (Patient taking differently: Take 100 mg by mouth 2 (two) times daily as needed. ) 60 capsule 0  . DULoxetine (CYMBALTA) 20 MG capsule Take 40 mg by mouth at bedtime.   4  . HYDROcodone-acetaminophen (NORCO) 10-325 MG tablet Take 1 tablet by mouth  every 8 (eight) hours as needed for severe pain. 90 tablet 0  . hydroquinone 4 % cream 1 APPLICATION TOPICALLY DAILY AS NEEDED FOR BLEMISHES.  3  . hydrOXYzine (VISTARIL) 50 MG capsule Take 100 mg by mouth at bedtime.     . lamoTRIgine (LAMICTAL) 150 MG tablet Take two tablets by mouth daily (Patient taking differently: Take 300 mg by mouth at bedtime. ) 180 tablet 3  . levalbuterol (XOPENEX HFA) 45 MCG/ACT inhaler Inhale 1-2 puffs into the lungs every 6 (six) hours as needed for wheezing. 1 Inhaler 5  . Lysine 1000 MG TABS Take 2,000-4,000 mg by mouth at bedtime. 2000 mg scheduled at bedtime and patient will take 4000 mg if she has outbreak    . mirabegron ER (MYRBETRIQ) 25 MG TB24 tablet Take 1 tablet (25 mg total) by mouth daily. 30 tablet 12  . neomycin-bacitracin-polymyxin (NEOSPORIN) [phone] ointment Apply 1 application topically 4 (four) times daily as needed (for cut/scrapes.).    Marland Kitchen nicotine (NICODERM CQ - DOSED IN MG/24 HOURS) 21 mg/24hr patch Place 1 patch (21 mg total) onto the skin daily. 28 patch 3  . ondansetron (ZOFRAN ODT) 4 MG disintegrating tablet Take 1 tablet (4 mg total) by mouth every 8 (eight) hours as needed for nausea. 12 tablet 0  . polyethylene glycol powder (GLYCOLAX/MIRALAX) powder MIX 17 GRAMS (1 CAPFUL) WITH 4-8 OZ OF LIQUID AND TAKE BY MOUTH TWICE DAILY AS NEEDED (Patient taking differently: MIX 17 GRAMS (1 CAPFUL) WITH 4-8 OZ OF LIQUID AND TAKE BY MOUTH TWICE DAILY AS NEEDED FOR CONSTIPATION) 527 g 0  . tamsulosin (FLOMAX) 0.4 MG CAPS capsule TAKE 1 CAPSULE (0.4 MG TOTAL) BY MOUTH DAILY. 30 capsule 0  . temazepam (RESTORIL) 30 MG capsule TAKE ONE TO TWO CAPSULES BY MOUTH NIGHTLY AT BEDTIME AS NEEDED  (Patient taking differently: TAKE ONE TO TWO CAPSULES BY MOUTH NIGHTLY AT BEDTIME) 60 capsule 0  . tiZANidine (ZANAFLEX) 4 MG tablet TAKE 2 TABLETS (8 MG TOTAL) BY MOUTH 3 (THREE) TIMES DAILY. (Patient taking differently: TAKE 2 TABLETS (8 MG TOTAL) BY MOUTH 3 (THREE)  TIMES DAILY AS NEEDED FOR MUSCLE SPASMS (SCHEDULED AT BEDTIME)) 180 tablet 5  . zolpidem (AMBIEN) 10 MG tablet Take 10 mg by mouth at bedtime.     Marland Kitchen  fluconazole (DIFLUCAN) 150 MG tablet Take 1 tablet (150 mg total) by mouth every 3 (three) days. (Patient not taking: Reported on 10/19/2016) 3 tablet 0   Current Facility-Administered Medications on File Prior to Visit  Medication Dose Route Frequency Provider Last Rate Last Dose  . cyanocobalamin ((VITAMIN B-12)) injection 1,000 mcg  1,000 mcg Intramuscular Q30 days [name], NP   1,000 mcg at 08/20/16 1500    BP 122/78   Pulse 93   Temp 99 F (37.2 C) (Oral)   Ht 5\' 9"  (1.753 m)   Wt 208 lb 12.8 oz (94.7 kg)   LMP 08/23/2014   SpO2 97%   BMI 30.83 kg/m    Objective:   Physical Exam  Constitutional: She appears well-nourished. She does not appear ill.  Neck: Neck supple.  Cardiovascular: Normal rate and regular rhythm.   Pulmonary/Chest: Effort normal and breath sounds normal.  Abdominal: Soft. There is no tenderness. There is CVA tenderness.  Right CVA tenderness          Assessment & Plan:  [facility] Follow Up:  Admitted on 10/10/16 for sepsis after removal of ureteral stents 3 days prior. Hospital course with fevers despite antibiotic treatment until after 1+ days of Ampicillin. Consulted by Urology and Infectious Disease. Still running lower grade fevers, doing well overall. Exam today stable. She does not appear acutely ill or in distress. She has already scheduled follow up with Urology and Infectious Disease. Strongly encouraged her not to travel out of state unless cleared by Urology and Infectious disease, especially if fevers to do not return to normal. She verablized understanding. Continue Amoxicillin. Discussed when to start Diflucan. Strict ED/return precautions provided.  All hospital labs, imaging, notes reviewed. Sheral Flow, NP

## 2016-10-19 NOTE — Progress Notes (Signed)
Pre visit review using our clinic review tool, if applicable. No additional management support is needed unless otherwise documented below in the visit note. 

## 2016-10-27 ENCOUNTER — Encounter: Payer: Self-pay | Admitting: Primary Care

## 2016-10-27 ENCOUNTER — Telehealth: Payer: Self-pay | Admitting: Urology

## 2016-10-27 DIAGNOSIS — N200 Calculus of kidney: Secondary | ICD-10-CM

## 2016-10-27 NOTE — Telephone Encounter (Signed)
Pt has appt this Thursday with you.  She is going to [place] 12/21 for Christmas.  She would like to know if there is anything she can have done before her appt to make sure right side kidney stone hasn't dropped.  She wants to make sure it's not moving.  Her biggest fear is something happening to her in [place].  She wants to know if there is any type of testing she can have done before her appt on Thursday.  Please advise.

## 2016-10-28 ENCOUNTER — Other Ambulatory Visit: Payer: Self-pay | Admitting: Primary Care

## 2016-10-28 NOTE — Telephone Encounter (Signed)
Nothing to do.  Please have her get a KUB on the AM prior to her appt tomorrow.  Hollice Espy, MD

## 2016-10-28 NOTE — Telephone Encounter (Signed)
Spoke with pt in reference to concerns and KUB prior to appt.  Pt voiced understanding. Orders placed.

## 2016-10-29 ENCOUNTER — Ambulatory Visit (INDEPENDENT_AMBULATORY_CARE_PROVIDER_SITE_OTHER): Payer: PPO | Admitting: Urology

## 2016-10-29 ENCOUNTER — Ambulatory Visit
Admission: RE | Admit: 2016-10-29 | Discharge: 2016-10-29 | Disposition: A | Discharge status: Home / Self Care | Payer: PPO | Source: Ambulatory Visit | Attending: Urology | Admitting: Urology

## 2016-10-29 ENCOUNTER — Encounter: Payer: Self-pay | Admitting: Urology

## 2016-10-29 VITALS — BP 129/84 | HR 101 | Temp 98.5°F | Ht 69.0 in | Wt 204.0 lb

## 2016-10-29 DIAGNOSIS — Z87448 Personal history of other diseases of urinary system: Secondary | ICD-10-CM

## 2016-10-29 DIAGNOSIS — N200 Calculus of kidney: Secondary | ICD-10-CM

## 2016-10-29 IMAGING — CR DG ABDOMEN 1V
1 series · 2 of 2 positions shown · non-contrast
Comparison: CT abdomen and pelvis [DATE]; abdominal
radiograph [DATE]

CLINICAL DATA: Recent nephrolithiasis.  Recent lithotripsy

EXAM:
ABDOMEN - 1 VIEW

[Series 1: dg abd 1 view · 0.14mm/px · 2 of 2 slices shown]
[im 1/2]
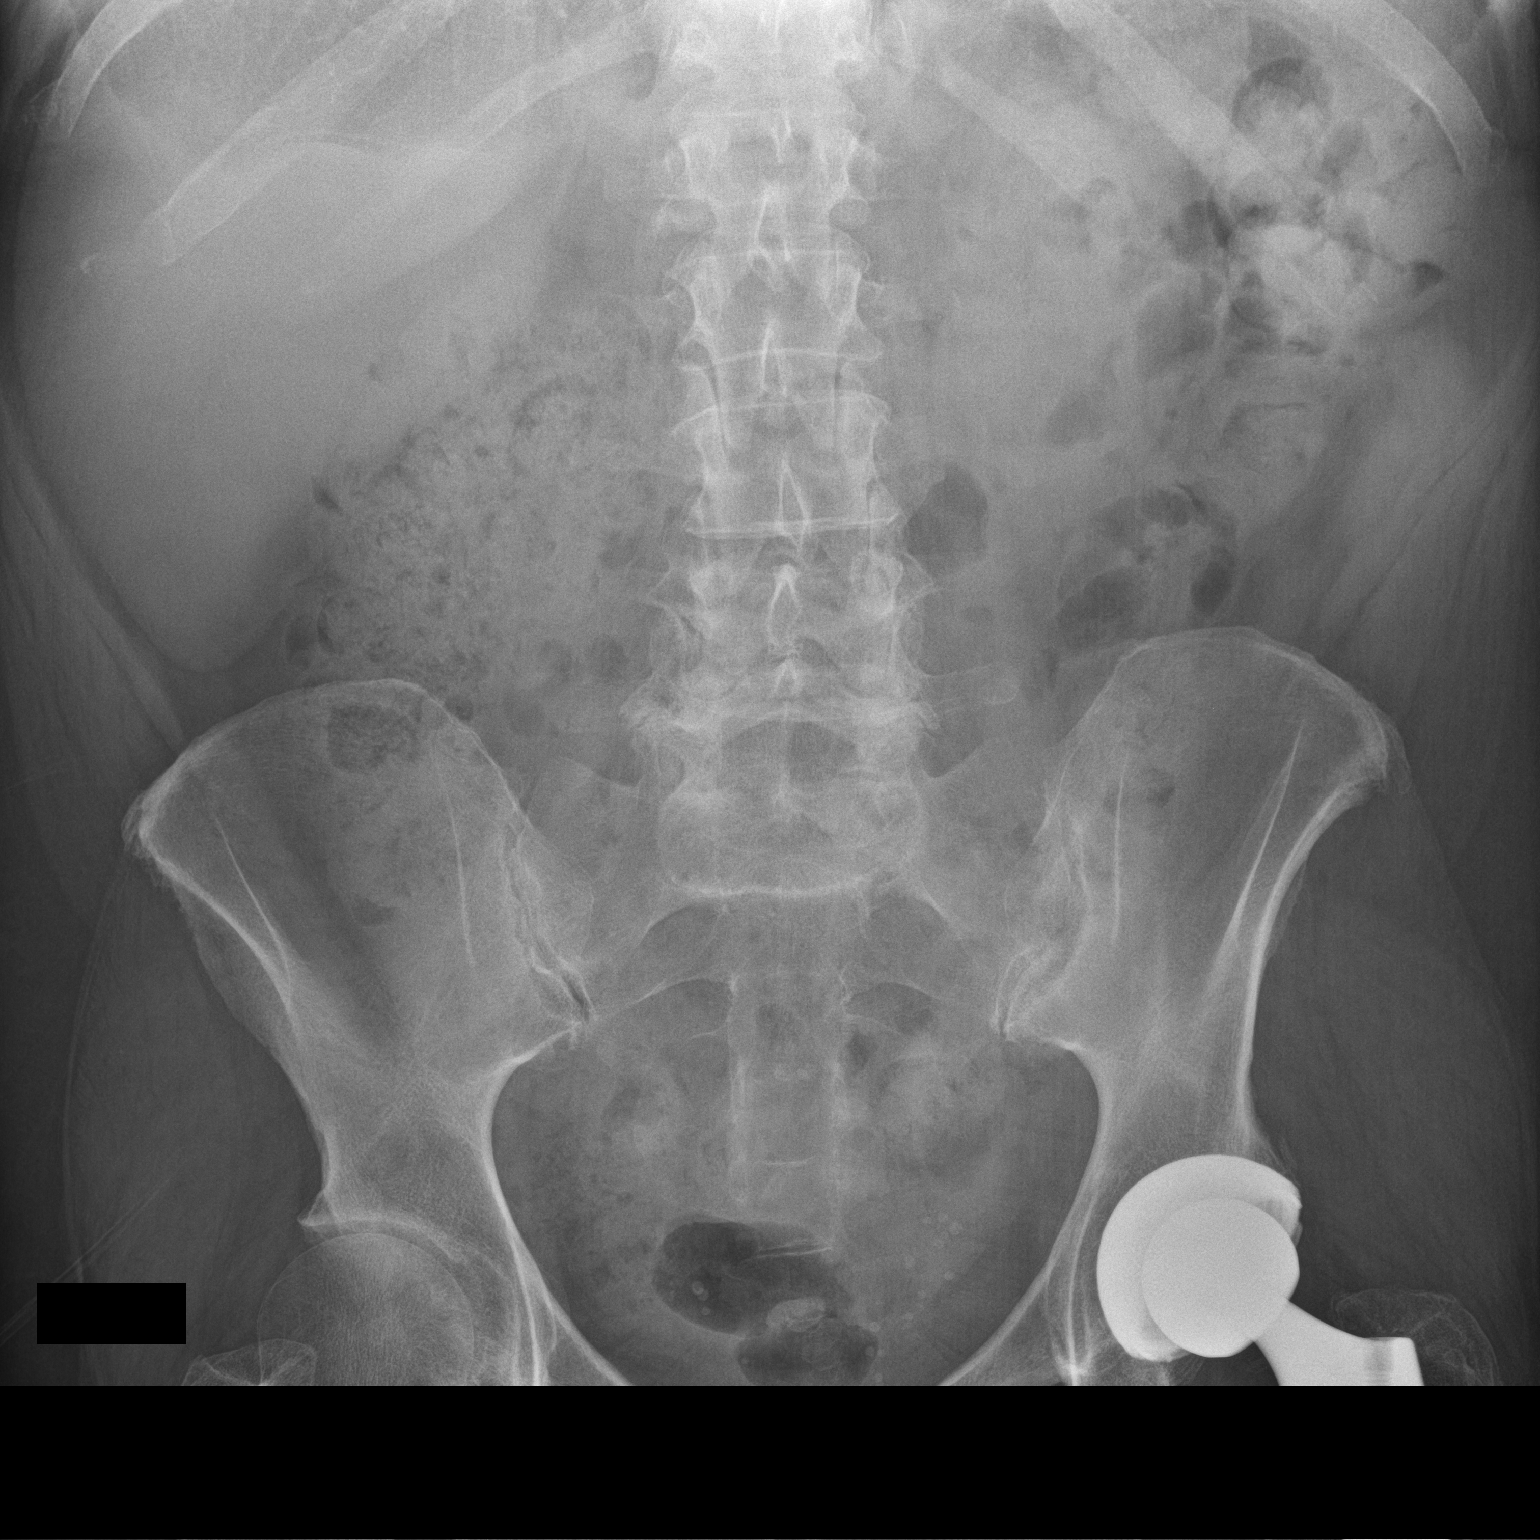
[im 2/2]
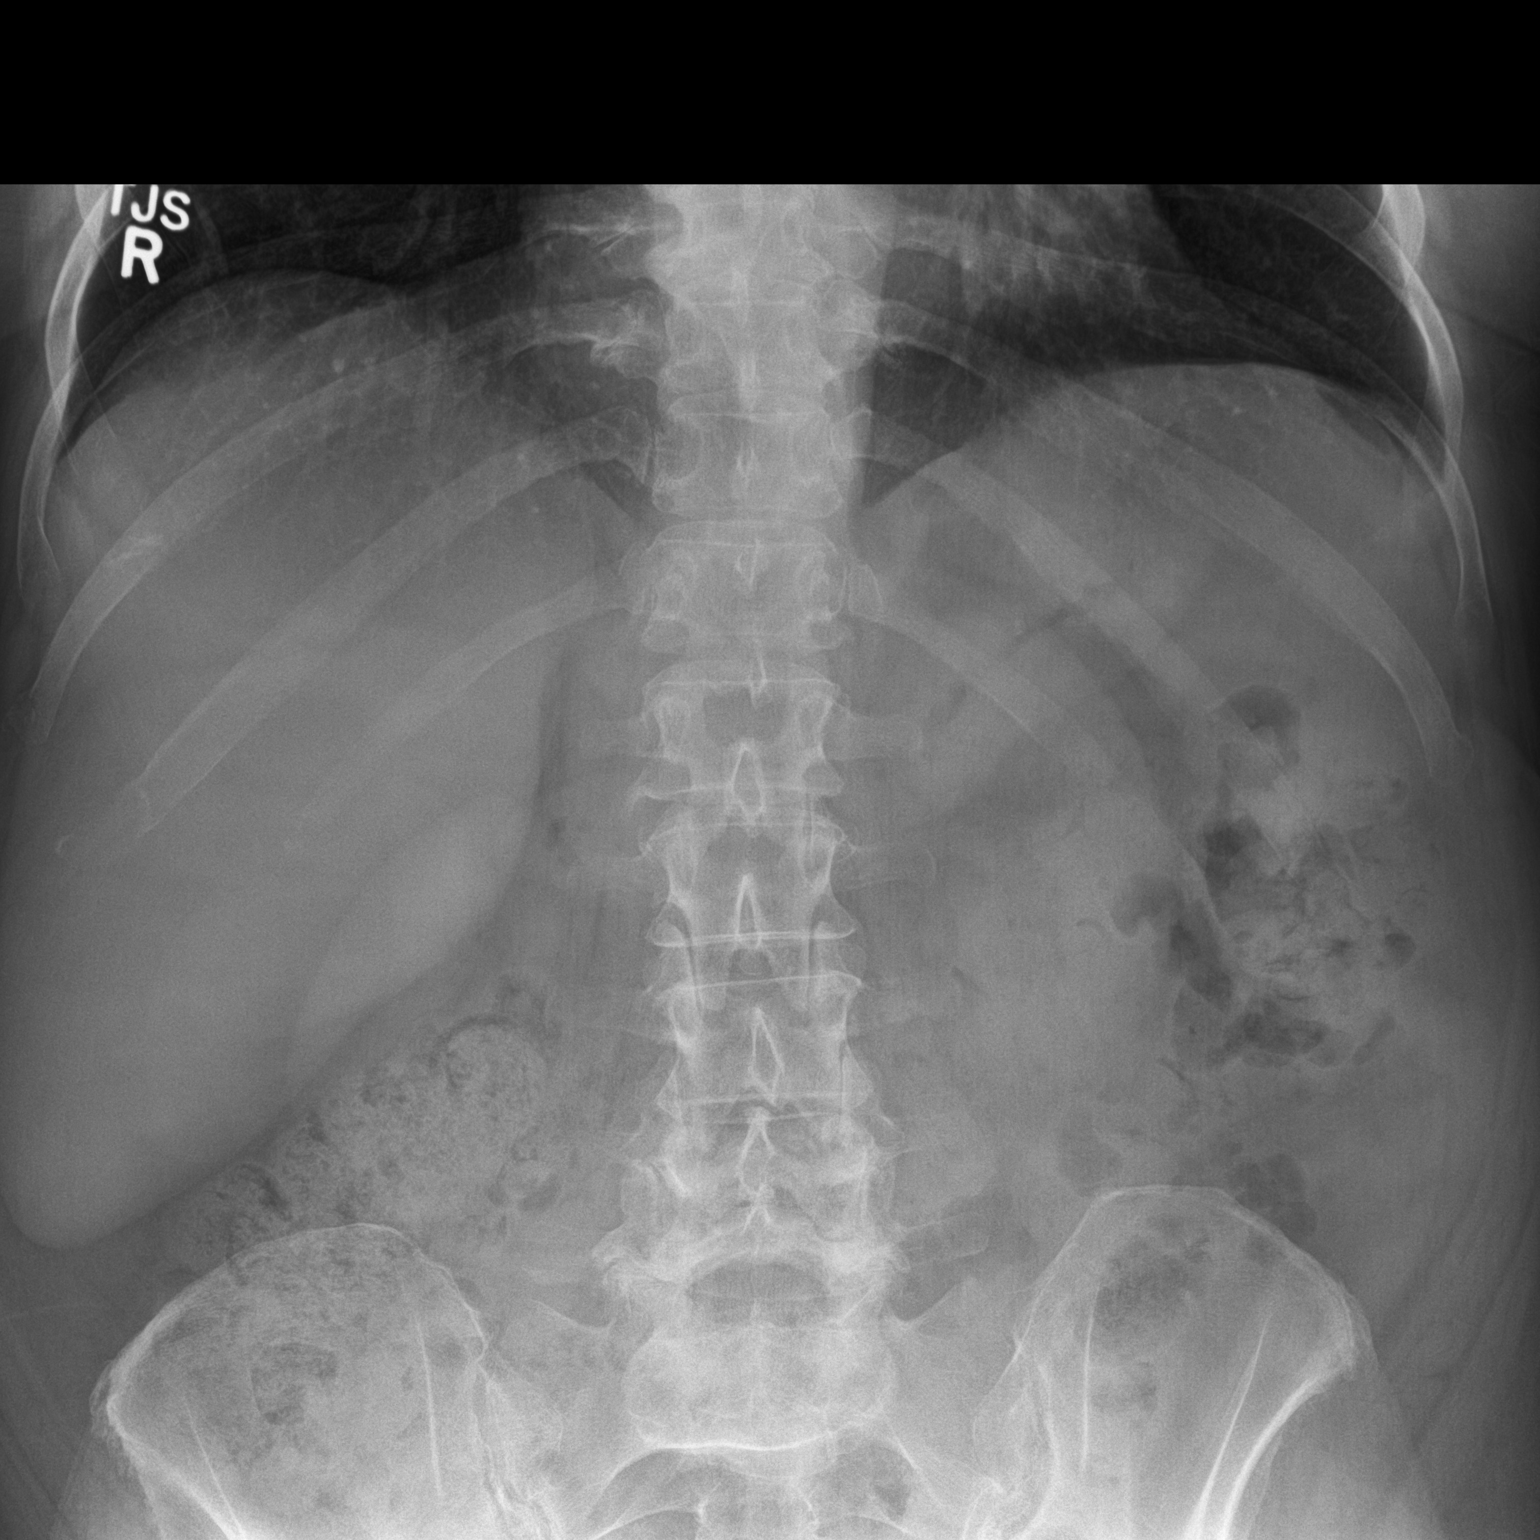

[2 of 2 positions shown; findings below may reference images not displayed]

FINDINGS: There are phleboliths throughout the pelvis bilaterally. No calculi
identified in the distributions of the kidneys and ureters.

There is diffuse stool throughout the colon. There is no bowel
dilatation or air-fluid level suggesting bowel obstruction. No free
air. Lung bases are clear. Liver appears prominent; note that liver
measured approximately 20 cm in length on recent CT. Status post
total hip replacement on the left.
IMPRESSION: No renal or ureteral calculi identified by radiography. Multiple
phleboliths the pelvis. Bowel gas pattern normal. Fairly diffuse
stool throughout colon. Prominent liver, unchanged from recent CT.

## 2016-10-29 NOTE — Progress Notes (Signed)
10/29/2016 10:13 AM   Emily Moon [birth year] [id number]  Referring provider: [name], NP [place], [place] [address]  Chief Complaint  Patient presents with  . Nephrolithiasis    hospital f/u    HPI: 55 year old female with a history of multiple sclerosis with bilateral nephrolithiasis who underwent bilateral ureteroscopy to treat a small left ureteral stone as well as an 8 mm right UPJ stone on 09/22/2016.  Her stents were subsequently removed on 10/07/2016. Unfortunately, she presented to the emergency room on 10/10/2016 with high fevers.  She continued to spike fevers for multiple days but had a somewhat prolonged admission. She ultimately grew enterococcus faecalis in her urine, only 20,000 colonies.  Blood cultures were negative.  She was treated with a 21 day course of amoxicillin.   She did have imaging in the form of CT renal stone on 10/10/2016 which showed no hydronephrosis bilaterally and no obstructing ureteral calculi. She did have a 3 mm right lower pole stone, otherwise no significant residual stone burden.    She does have a personal history of stones.  She was hospitalized with 2008 with impacted left ureteral stone s/p septic shock/ percutaneous nephrostomy tube.  Ultimately had EWSL and stent.    She has follow up with ID later this week.  She reports that she's been having an occasional low-grade fever of 99 but no high fever since discharge.  She has been experiencing some right greater than left double flank pain and diffuse pain across her belly at times, this is manageable and dull.  No stone analysis due to fragmentation of the stone.    She does drink plenty of water.  She has stopped taking TUMS.  She is somewhat anxious about traveling to [place] for Christmas given her recent medical setbacks.  PMH: Past Medical History:  Diagnosis Date  . Arthritis    osteo  . Asthma   . Bipolar disorder ([place]) 05/21/14  . Cataracts,  bilateral   . DDD (degenerative disc disease), cervical    also back  . Depression   . Dizziness    Positional  . Edema    feet/legs  . Fibromyalgia syndrome   . Fungal infection    Finger nails  . GERD (gastroesophageal reflux disease)   . Gout   . Headache    seasonal allergies  . Heart palpitations   . 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 Hypercholesterolemia   . Multiple sclerosis (H[facility])    weakness  . Nephrolithiasis    kidney stones  . Osteoporosis    osteoarthritis  . Pneumonia   . PONV (postoperative nausea and vomiting)    no problem after cataract surgery  . Psoriasis   . Renal stone   . Shortness of breath dyspnea    wheezing  . Sleep apnea 2012   sleep study / slight, no interventions  . Urinary frequency     Surgical History: Past Surgical History:  Procedure Laterality Date  . CATARACT EXTRACTION W/PHACO Left 05/21/2015   Procedure: CATARACT EXTRACTION PHACO AND INTRAOCULAR LENS PLACEMENT (IOC);  Surgeon: Birder Robson, MD;  Location: [facility];  Service: Ophthalmology;  Laterality: Left;  [place] 00:35 AP% 22.9 CDE 8.11 fluid pack lot [id number]  . CATARACT EXTRACTION W/PHACO Right 06/04/2015   Procedure: CATARACT EXTRACTION PHACO AND INTRAOCULAR LENS PLACEMENT (IOC);  Surgeon: Birder Robson, MD;  Location: [facility];  Service: Ophthalmology;  Laterality: Right;  US:00:48 AP%: 10.5 CDE:5.08 Fluid lot [id number]  .  CYSTOSCOPY/URETEROSCOPY/HOLMIUM LASER/STENT PLACEMENT Bilateral 09/22/2016   Procedure: CYSTOSCOPY/URETEROSCOPY/HOLMIUM LASER/STENT PLACEMENT;  Surgeon: Hollice Espy, MD;  Location: [facility];  Service: Urology;  Laterality: Bilateral;  . EYE SURGERY  2015   tissue biopsy  . FOOT SURGERY  2015  . JOINT REPLACEMENT Left 2013   hip replacement  . LITHOTRIPSY    . PTOSIS REPAIR Bilateral 02/18/2016   Procedure: BILATERAL PTOSIS REPAIR UPPER EYELIDS;  Surgeon: Karle Starch, MD;  Location: [place];  Service: Ophthalmology;   Laterality: Bilateral;  LEAVE PT EARLY AM  . thumb surgery Right   . TONSILLECTOMY  1973    Home Medications:    Medication List       Accurate as of 10/29/16 10:13 AM. Always use your most recent med list.          amoxicillin 500 MG capsule Commonly known as:  AMOXIL Take 1 capsule (500 mg total) by mouth 3 (three) times daily.   ASPERCREME LIDOCAINE EX Apply 1 application topically 4 (four) times daily as needed (for pain.).   aspirin 325 MG tablet Take 325 mg by mouth every 4 (four) hours as needed for headache.   Biotin 5000 MCG Tabs Take 10,000 mcg by mouth every evening.   calcipotriene-betamethasone ointment Commonly known as:  TACLONEX Apply 1 application topically daily as needed (for psorasis).   clobetasol cream 0.05 % Commonly known as:  TEMOVATE Apply 1 application topically 2 (two) times daily.   cyanocobalamin 1000 MCG/ML injection Commonly known as:  (VITAMIN B-12) Inject 1,000 mcg into the muscle every 8 (eight) weeks. Every other month   diclofenac sodium 1 % Gel Commonly known as:  VOLTAREN Apply 4 g topically 4 (four) times daily.   docusate sodium 100 MG capsule Commonly known as:  COLACE Take 1 capsule (100 mg total) by mouth 2 (two) times daily.   DULoxetine 20 MG capsule Commonly known as:  CYMBALTA Take 40 mg by mouth at bedtime.   fluconazole 150 MG tablet Commonly known as:  DIFLUCAN Take 1 tablet (150 mg total) by mouth every 3 (three) days.   HYDROcodone-acetaminophen 10-325 MG tablet Commonly known as:  NORCO Take 1 tablet by mouth every 8 (eight) hours as needed for severe pain.   hydroquinone 4 % cream 1 APPLICATION TOPICALLY DAILY AS NEEDED FOR BLEMISHES.   hydrOXYzine 50 MG capsule Commonly known as:  VISTARIL Take 100 mg by mouth at bedtime.   lamoTRIgine 150 MG tablet Commonly known as:  LAMICTAL Take two tablets by mouth daily   levalbuterol 45 MCG/ACT inhaler Commonly known as:  XOPENEX HFA Inhale 1-2  puffs into the lungs every 6 (six) hours as needed for wheezing.   Lysine 1000 MG Tabs Take 2,000-4,000 mg by mouth at bedtime. 2000 mg scheduled at bedtime and patient will take 4000 mg if she has outbreak   mirabegron ER 25 MG Tb24 tablet Commonly known as:  MYRBETRIQ Take 1 tablet (25 mg total) by mouth daily.   neomycin-bacitracin-polymyxin [phone] ointment Apply 1 application topically 4 (four) times daily as needed (for cut/scrapes.).   nicotine 21 mg/24hr patch Commonly known as:  NICODERM CQ - dosed in mg/24 hours Place 1 patch (21 mg total) onto the skin daily.   ondansetron 4 MG disintegrating tablet Commonly known as:  ZOFRAN ODT Take 1 tablet (4 mg total) by mouth every 8 (eight) hours as needed for nausea.   polyethylene glycol powder powder Commonly known as:  GLYCOLAX/MIRALAX MIX 17 GRAMS (1 CAPFUL)  WITH 4-8 OZ OF LIQUID AND TAKE BY MOUTH TWICE DAILY AS NEEDED   REFRESH TEARS 0.5 % Soln Generic drug:  carboxymethylcellulose Place 1-2 drops into both eyes 3 (three) times daily as needed.   tamsulosin 0.4 MG Caps capsule Commonly known as:  FLOMAX TAKE 1 CAPSULE (0.4 MG TOTAL) BY MOUTH DAILY.   temazepam 30 MG capsule Commonly known as:  RESTORIL TAKE ONE TO TWO CAPSULES BY MOUTH NIGHTLY AT BEDTIME AS NEEDED   tiZANidine 4 MG tablet Commonly known as:  ZANAFLEX TAKE 2 TABLETS (8 MG TOTAL) BY MOUTH 3 (THREE) TIMES DAILY.   zolpidem 10 MG tablet Commonly known as:  AMBIEN Take 10 mg by mouth at bedtime.       Allergies:  Allergies  Allergen Reactions  . Albuterol Shortness Of Breath and Other (See Comments)    Makes pt feel jittery/ tacycardic  . Halcion [Triazolam] Other (See Comments)    Dizziness,headaches,bladder problems  . Levaquin [Levofloxacin In D5w] Diarrhea and Itching    Shoulder pain  . Naproxen Sodium Swelling    Patient tolerates in small doses  . Tylenol [Acetaminophen] Swelling    Patient tolerates in small doses  . Cefaclor  Other (See Comments)    Doesn't remember---unsure if actually allergic   . Diclofenac Sodium Other (See Comments)    "made very sick"  . Sulfa Antibiotics Itching    Unsure of reaction possibly itching  . Tramadol Itching and Nausea And Vomiting  . Aripiprazole Other (See Comments)    Muscle tension/cramping  . Ibuprofen Swelling    Patient tolerates in small doses    Family History: Family History  Problem Relation Age of Onset  . Cancer Father     Abdomen with mastasis  . Cancer Mother   . Heart disease Mother   . Kidney disease Neg Hx   . Bladder Cancer Neg Hx   . Prostate cancer Neg Hx     Social History:  reports that she quit smoking about 3 years ago. Her smoking use included Cigarettes. She quit after 25.00 years of use. She has never used smokeless tobacco. She reports that she uses drugs, including Methylphenidate. She reports that she does not drink alcohol.  ROS: UROLOGY Frequent Urination?: Yes Hard to postpone urination?: No Burning/pain with urination?: No Get up at night to urinate?: Yes Leakage of urine?: Yes Urine stream starts and stops?: No Trouble starting stream?: No Do you have to strain to urinate?: Yes Blood in urine?: No Urinary tract infection?: No Sexually transmitted disease?: No Injury to kidneys or bladder?: No Painful intercourse?: No Weak stream?: Yes Currently pregnant?: No Vaginal bleeding?: No Last menstrual period?: n  Gastrointestinal Nausea?: No Vomiting?: No Indigestion/heartburn?: Yes Diarrhea?: No Constipation?: No  Constitutional Fever: Yes Night sweats?: Yes Weight loss?: Yes Fatigue?: No  Skin Skin rash/lesions?: No Itching?: No  Eyes Blurred vision?: No Double vision?: No  Ears/Nose/Throat Sore throat?: No Sinus problems?: No  Hematologic/Lymphatic Swollen glands?: No Easy bruising?: No  Cardiovascular Leg swelling?: No Chest pain?: No  Respiratory Cough?: No Shortness of breath?:  No  Endocrine Excessive thirst?: No  Musculoskeletal Back pain?: Yes Joint pain?: Yes  Neurological Headaches?: No Dizziness?: No  Psychologic Depression?: Yes Anxiety?: Yes  Physical Exam: BP 129/84   Pulse (!) 101   Temp 98.5 F (36.9 C)   Ht 5\' 9"  (1.753 m)   Wt 204 lb (92.5 kg)   LMP 08/23/2014   BMI 30.13 kg/m   Constitutional:  Alert and oriented, No acute distress. HEENT: [place] AT, moist mucus membranes.  Trachea midline, no masses. Cardiovascular: No clubbing, cyanosis, or edema. Respiratory: Normal respiratory effort, no increased work of breathing. GI: Abdomen is soft, nontender, nondistended, no abdominal masses GU: No CVA tenderness.  Skin: No rashes, bruises or suspicious lesions. Neurologic: Grossly intact, no focal deficits, moving all 4 extremities. Psychiatric: Normal mood and affect.  Laboratory Data: Lab Results  Component Value Date   WBC 7.2 10/14/2016   HGB 9.9 (L) 10/14/2016   HCT 29.2 (L) 10/14/2016   MCV 93.5 10/14/2016   PLT 275 10/14/2016    Lab Results  Component Value Date   CREATININE 0.70 10/13/2016    Lab Results  Component Value Date   HGBA1C 6.0 03/24/2016    Urinalysis Deferred today, no urinary symptoms  Pertinent Imaging: CLINICAL DATA:  Recent nephrolithiasis.  Recent lithotripsy  EXAM: ABDOMEN - 1 VIEW  COMPARISON:  CT abdomen and pelvis October 10, 2016; abdominal radiograph January 30, 2014  FINDINGS: There are phleboliths throughout the pelvis bilaterally. No calculi identified in the distributions of the kidneys and ureters.  There is diffuse stool throughout the colon. There is no bowel dilatation or air-fluid level suggesting bowel obstruction. No free air. Lung bases are clear. Liver appears prominent; note that liver measured approximately 20 cm in length on recent CT. Status post total hip replacement on the left.  IMPRESSION: No renal or ureteral calculi identified by radiography.  Multiple phleboliths the pelvis. Bowel gas pattern normal. Fairly diffuse stool throughout colon. Prominent liver, unchanged from recent CT.   Electronically Signed   By: Lowella Grip III M.D.   On: 10/29/2016 08:42  KUB was personally reviewed today. This is compared to CT renal stone protocol on 10/10/2016.  Assessment & Plan:    1. Kidney stones S/p bilateral ureteroscopy for obstructing stones complicated by febrile UTI requiring hospital admission Follow-up CT scan shows resolution of hydronephrosis, residual 3 mm right lower pole fragment, asymptomatic No Stone analysis for review I have recommended proceeding with 24 hour urine metabolic workup given bilateral stones, multiple stones to a she is agreeable, we'll perform prior to next follow-up visit We discussed general stone prevention techniques including drinking plenty water with goal of producing 2.5 L urine daily, increased citric acid intake, avoidance of high oxalate containing foods, and decreased salt intake.  Information about dietary recommendations given today.   2. History of pyelonephritis Continues the antibiotics, has follow up with ID Given her history of urosepsis following urological procedures x 2, we will prophylax with additional antibiotics in the future if needed   Return in about 6 months (around 04/29/2017) for KUB, reviewed 24 hour urine.  Hollice Espy, MD  [facility] [address], [place], [place] [address] [phone]

## 2016-10-29 NOTE — Patient Instructions (Signed)
Dietary Guidelines to Help Prevent Kidney Stones Your risk of kidney stones can be decreased by adjusting the foods you eat. The most important thing you can do is drink enough fluid. You should drink enough fluid to keep your urine clear or pale yellow. The following guidelines provide specific information for the type of kidney stone you have had. Guidelines according to type of kidney stone Calcium Oxalate Kidney Stones  Reduce the amount of salt you eat. Foods that have a lot of salt cause your body to release excess calcium into your urine. The excess calcium can combine with a substance called oxalate to form kidney stones.  Reduce the amount of animal protein you eat if the amount you eat is excessive. Animal protein causes your body to release excess calcium into your urine. Ask your dietitian how much protein from animal sources you should be eating.  Avoid foods that are high in oxalates. If you take vitamins, they should have less than 500 mg of vitamin C. Your body turns vitamin C into oxalates. You do not need to avoid fruits and vegetables high in vitamin C. Calcium Phosphate Kidney Stones  Reduce the amount of salt you eat to help prevent the release of excess calcium into your urine.  Reduce the amount of animal protein you eat if the amount you eat is excessive. Animal protein causes your body to release excess calcium into your urine. Ask your dietitian how much protein from animal sources you should be eating.  Get enough calcium from food or take a calcium supplement (ask your dietitian for recommendations). Food sources of calcium that do not increase your risk of kidney stones include:  Broccoli.  Dairy products, such as cheese and yogurt.  Pudding. Uric Acid Kidney Stones  Do not have more than 6 oz of animal protein per day. Food sources Animal Protein Sources  Meat (all types).  Poultry.  Eggs.  Fish, seafood. Foods High in Salt  Salt seasonings.  Soy  sauce.  Teriyaki sauce.  Cured and processed meats.  Salted crackers and snack foods.  Fast food.  Canned soups and most canned foods. Foods High in Oxalates  Grains:  Amaranth.  Barley.  Grits.  Wheat germ.  Bran.  Buckwheat flour.  All bran cereals.  Pretzels.  Whole wheat bread.  Vegetables:  Beans (wax).  Beets and beet greens.  Collard greens.  Eggplant.  [place].  Leeks.  Okra.  Parsley.  Rutabagas.  Spinach.  Swiss chard.  Tomato paste.  Fried potatoes.  Sweet potatoes.  Fruits:  Red currants.  Figs.  Kiwi.  Rhubarb.  Meat and Other Protein Sources:  Beans (dried).  Soy burgers and other soybean products.  Miso.  Nuts (peanuts, almonds, pecans, cashews, hazelnuts).  Nut butters.  Sesame seeds and tahini (paste made of sesame seeds).  Poppy seeds.  Beverages:  Chocolate drink mixes.  Soy milk.  Instant iced tea.  Juices made from high-oxalate fruits or vegetables.  Other:  Carob.  Chocolate.  Fruitcake.  Marmalades. This information is not intended to replace advice given to you by your health care provider. Make sure you discuss any questions you have with your health care provider. Document Released: 02/27/2011 Document Revised: 04/09/2016 Document Reviewed: 09/29/2013 Elsevier Interactive Patient Education  2017 Else[employer] Inc.  

## 2016-10-30 IMAGING — US US RENAL
1 series · 14 of 25 positions shown · non-contrast
Comparison: [DATE]

CLINICAL DATA: Acute renal failure

EXAM:
RENAL / URINARY TRACT ULTRASOUND COMPLETE

[Series 1: us renal · 0.30mm/px · 14 of 43 slices shown]
[im 1/43]
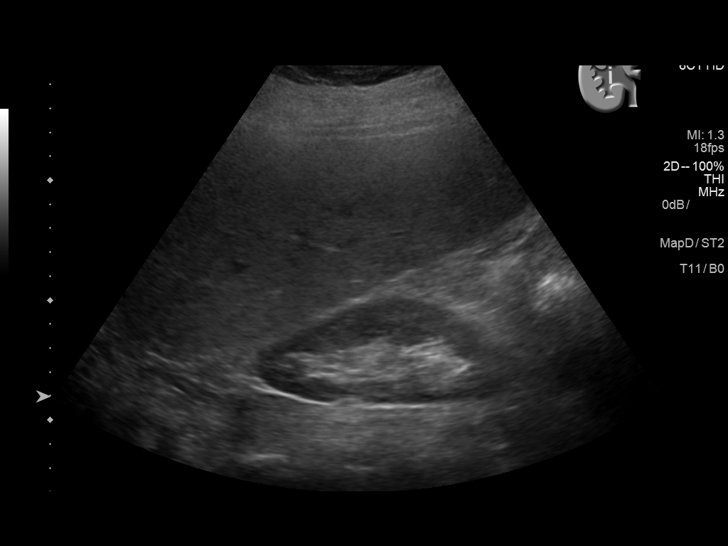
[im 4/43]
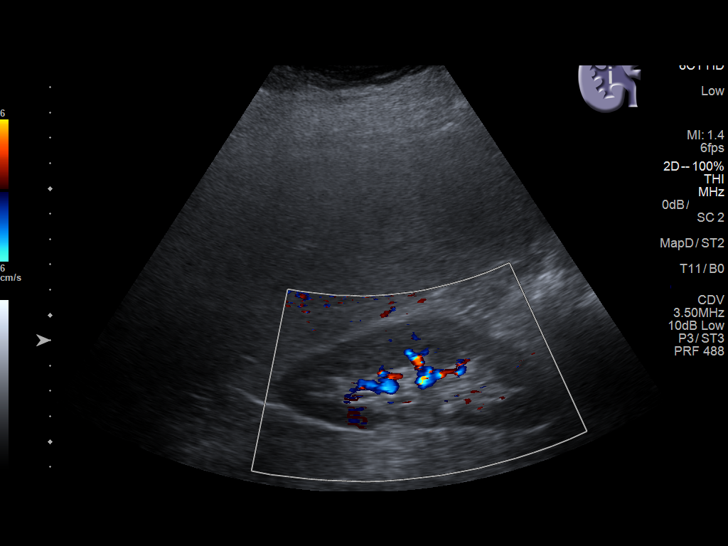
[im 8/43]
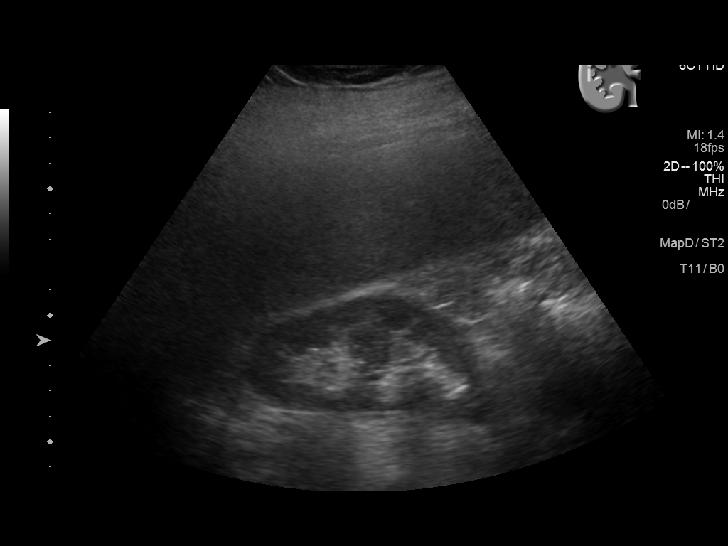
[im 11/43]
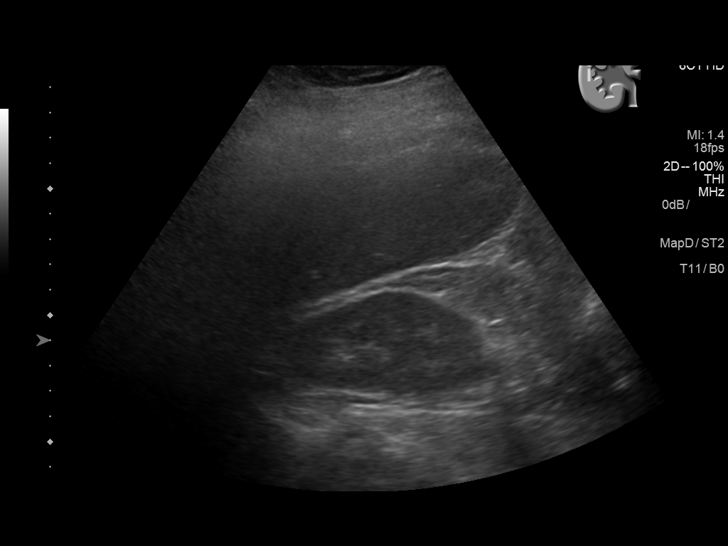
[im 15/43]
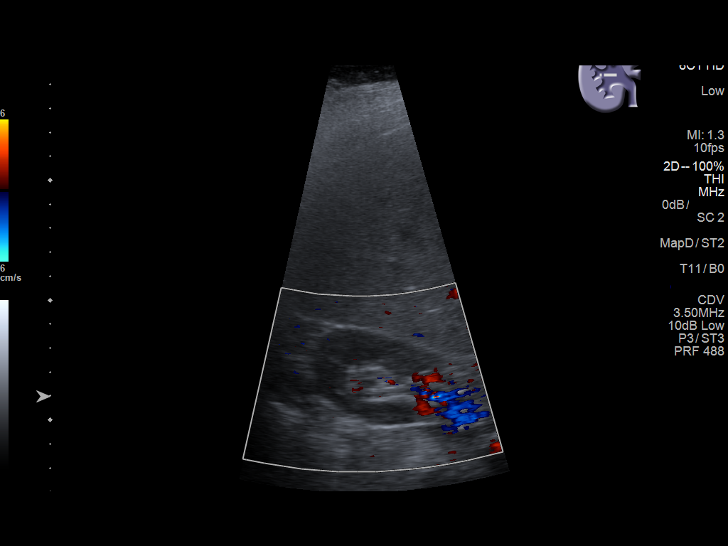
[im 16/43]
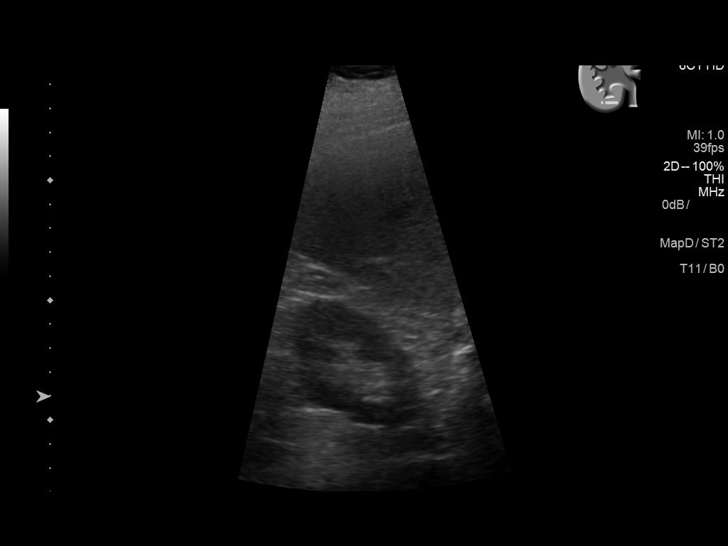
[im 20/43]
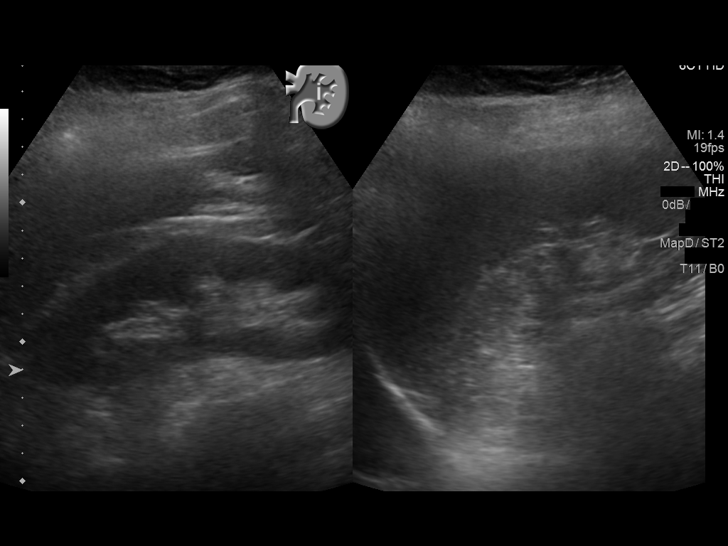
[im 23/43]
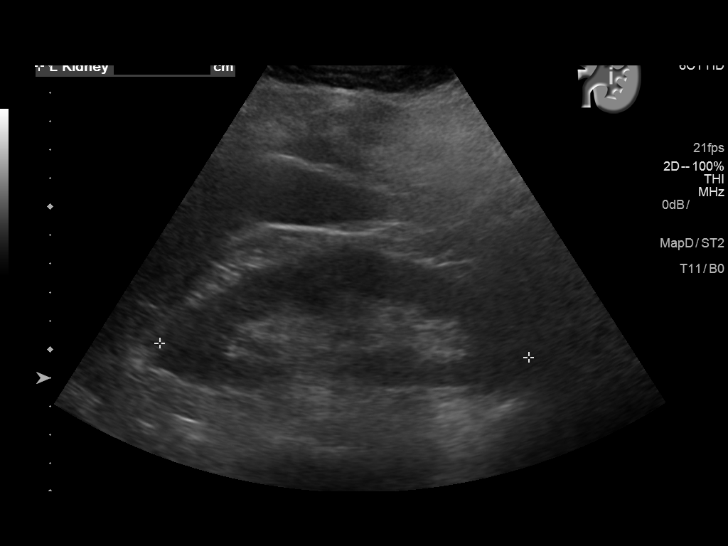
[im 27/43]
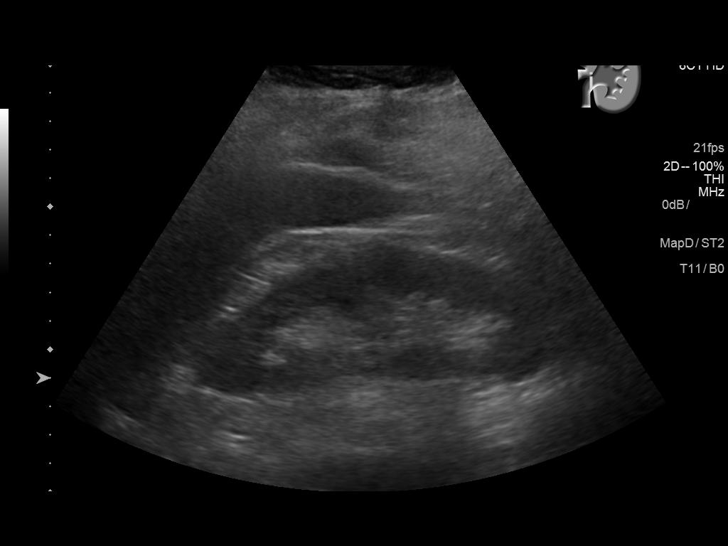
[im 29/43]
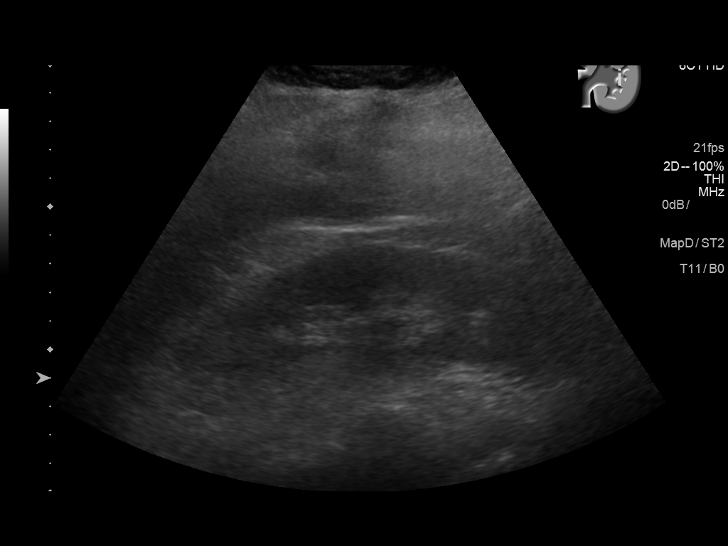
[im 32/43]
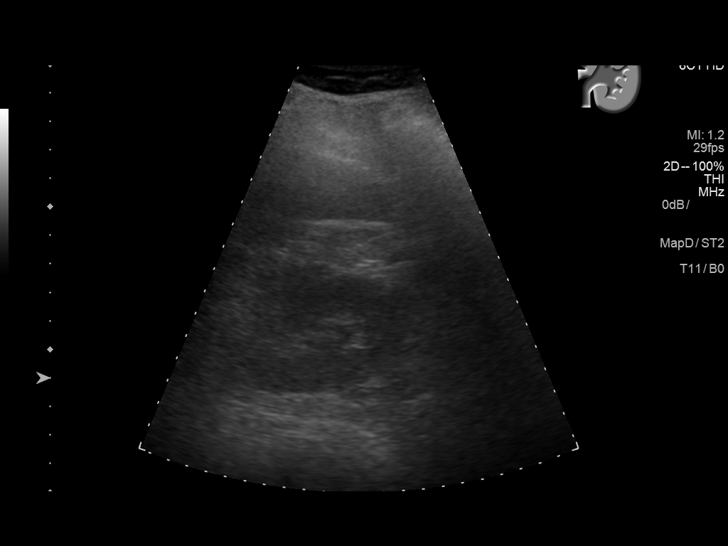
[im 36/43]
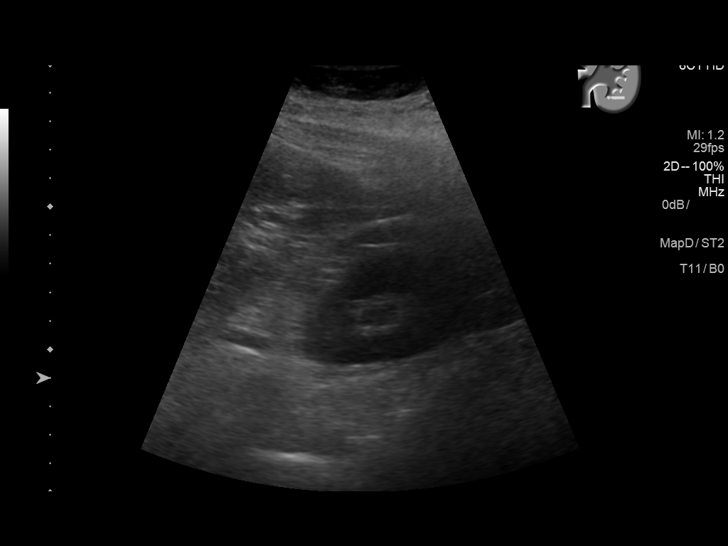
[im 39/43]
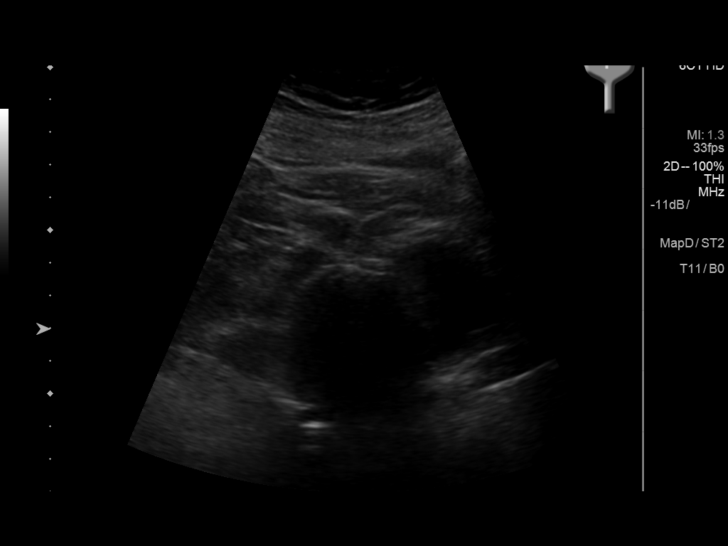
[im 43/43]
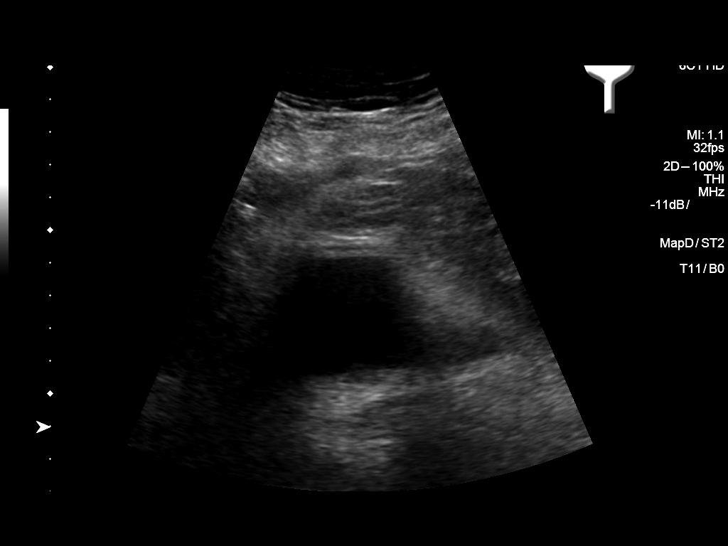

[14 of 25 positions shown; findings below may reference images not displayed]

FINDINGS: Right Kidney:

Length: 11.7 cm in length.. No hydronephrosis. Normal echogenicity.
Nonobstructive calculus in lower pole measures 5.6 mm.

Left Kidney:

Length: 12.9 cm. Echogenicity within normal limits. No mass or
hydronephrosis visualized.

Bladder:

Appears normal for degree of bladder distention.
IMPRESSION: 1. There is right nonobstructive nephrolithiasis. No hydronephrosis.
Under distended urinary bladder.

## 2016-11-02 DIAGNOSIS — G35 Multiple sclerosis: Secondary | ICD-10-CM | POA: Diagnosis not present

## 2016-11-02 DIAGNOSIS — N390 Urinary tract infection, site not specified: Secondary | ICD-10-CM | POA: Diagnosis not present

## 2016-11-02 DIAGNOSIS — N200 Calculus of kidney: Secondary | ICD-10-CM | POA: Diagnosis not present

## 2016-11-04 ENCOUNTER — Ambulatory Visit: Payer: PPO

## 2016-11-04 ENCOUNTER — Encounter: Payer: Self-pay | Admitting: Primary Care

## 2016-11-04 ENCOUNTER — Ambulatory Visit (INDEPENDENT_AMBULATORY_CARE_PROVIDER_SITE_OTHER): Payer: PPO | Admitting: Primary Care

## 2016-11-04 VITALS — BP 122/80 | HR 98 | Temp 98.9°F | Ht 69.0 in | Wt 206.0 lb

## 2016-11-04 DIAGNOSIS — N200 Calculus of kidney: Secondary | ICD-10-CM

## 2016-11-04 DIAGNOSIS — M1990 Unspecified osteoarthritis, unspecified site: Secondary | ICD-10-CM

## 2016-11-04 MED ORDER — TAMSULOSIN HCL 0.4 MG PO CAPS: 0.4000 mg | ORAL_CAPSULE | Freq: Every day | ORAL | 0 refills | Status: DC

## 2016-11-04 MED ORDER — HYDROCODONE-ACETAMINOPHEN 10-325 MG PO TABS: 1.0000 | ORAL_TABLET | Freq: Three times a day (TID) | ORAL | 0 refills | Status: DC | PRN

## 2016-11-04 MED ORDER — KETOROLAC TROMETHAMINE 10 MG PO TABS: 10.0000 mg | ORAL_TABLET | Freq: Two times a day (BID) | ORAL | 0 refills | Status: DC | PRN

## 2016-11-04 NOTE — Progress Notes (Signed)
Subjective:    Patient ID: Emily Moon, female    DOB: 02-03-1961, 55 y.o.   MRN: [id number]  HPI  Emily Moon is a 55 year old female with a history of renal stones, obstructive renal stones, sepsis secondary to renal stone obstruction, and UTI's. She is currently following with Urology and Infectious disease. She saw infectious disease on 11/02/16 who put her on Macrobid 100 mg once daily for 14 days. She also had lab work done on 11/02/16 through [facility].   She started experiencing right flank pain with radiation to her right side and groin that began Monday this week. She continues to run low grade fevers at night, none during the day. Her appetite continues to be reduced. She has noticed foam and sediment to her urine. She is worried about traveling with these symptoms. She denies nausea, vomiting, hematuria, weakness, high fevers.  Review of Systems  Constitutional: Positive for fatigue and fever.  Gastrointestinal: Negative for nausea and vomiting.  Genitourinary: Positive for flank pain. Negative for dysuria, frequency, hematuria and vaginal discharge.       Past Medical History:  Diagnosis Date  . Arthritis    osteo  . Asthma   . Bipolar disorder ([place]) 05/21/14  . Cataracts, bilateral   . DDD (degenerative disc disease), cervical    also back  . Depression   . Dizziness    Positional  . Edema    feet/legs  . Fibromyalgia syndrome   . Fungal infection    Finger nails  . GERD (gastroesophageal reflux disease)   . Gout   . Headache    seasonal allergies  . Heart palpitations   . 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 Hypercholesterolemia   . Multiple sclerosis (HCC)    weakness  . Nephrolithiasis    kidney stones  . Osteoporosis    osteoarthritis  . Pneumonia   . PONV (postoperative nausea and vomiting)    no problem after cataract surgery  . Psoriasis   . Renal stone   . Shortness of breath dyspnea    wheezing  . Sleep apnea 2012   sleep  study / slight, no interventions  . Urinary frequency      Social History   Social History  . Marital status: Divorced    Spouse name: N/A  . Number of children: 1  . Years of education: N/A   Occupational History  . 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 Smoking status: Former Smoker    Years: 25.00    Types: Cigarettes    Quit date: 11/16/2012  . Smokeless tobacco: Never Used     Comment: recenty 04/26/2014  . Alcohol use No  . Drug use:     Types: Methylphenidate  . Sexual activity: No   Other Topics Concern  . Not on file   Social History Narrative  . No narrative on file    Past Surgical History:  Procedure Laterality Date  . CATARACT EXTRACTION W/PHACO Left 05/21/2015   Procedure: CATARACT EXTRACTION PHACO AND INTRAOCULAR LENS PLACEMENT (IOC);  Surgeon: Birder Robson, MD;  Location: [facility];  Service: Ophthalmology;  Laterality: Left;  [place] 00:35 AP% 22.9 CDE 8.11 fluid pack lot [id number]  . CATARACT EXTRACTION W/PHACO Right 06/04/2015   Procedure: CATARACT EXTRACTION PHACO AND INTRAOCULAR LENS PLACEMENT (IOC);  Surgeon: Birder Robson, MD;  Location: [facility];  Service: Ophthalmology;  Laterality: Right;  US:00:48 AP%: 10.5 CDE:5.08 Fluid lot #  [id number]  . CYSTOSCOPY/URETEROSCOPY/HOLMIUM LASER/STENT PLACEMENT Bilateral 09/22/2016   Procedure: CYSTOSCOPY/URETEROSCOPY/HOLMIUM LASER/STENT PLACEMENT;  Surgeon: Hollice Espy, MD;  Location: [facility];  Service: Urology;  Laterality: Bilateral;  . EYE SURGERY  2015   tissue biopsy  . FOOT SURGERY  2015  . JOINT REPLACEMENT Left 2013   hip replacement  . LITHOTRIPSY    . PTOSIS REPAIR Bilateral 02/18/2016   Procedure: BILATERAL PTOSIS REPAIR UPPER EYELIDS;  Surgeon: Karle Starch, MD;  Location: [place];  Service: Ophthalmology;  Laterality: Bilateral;  LEAVE PT EARLY AM  . thumb surgery Right   . TONSILLECTOMY  1973    Family History  Problem Relation Age of Onset    . Cancer Father     Abdomen with mastasis  . Cancer Mother   . Heart disease Mother   . Kidney disease Neg Hx   . Bladder Cancer Neg Hx   . Prostate cancer Neg Hx     Allergies  Allergen Reactions  . Albuterol Shortness Of Breath and Other (See Comments)    Makes pt feel jittery/ tacycardic  . Halcion [Triazolam] Other (See Comments)    Dizziness,headaches,bladder problems  . Levaquin [Levofloxacin In D5w] Diarrhea and Itching    Shoulder pain  . Naproxen Sodium Swelling    Patient tolerates in small doses  . Tylenol [Acetaminophen] Swelling    Patient tolerates in small doses  . Cefaclor Other (See Comments)    Doesn't remember---unsure if actually allergic   . Diclofenac Sodium Other (See Comments)    "made very sick"  . Sulfa Antibiotics Itching    Unsure of reaction possibly itching  . Tramadol Itching and Nausea And Vomiting  . Aripiprazole Other (See Comments)    Muscle tension/cramping  . Ibuprofen Swelling    Patient tolerates in small doses    Current Outpatient Prescriptions on File Prior to Visit  Medication Sig Dispense Refill  . ASPERCREME LIDOCAINE EX Apply 1 application topically 4 (four) times daily as needed (for pain.).     Marland Kitchen aspirin 325 MG tablet Take 325 mg by mouth every 4 (four) hours as needed for headache.     . Biotin 5000 MCG TABS Take 10,000 mcg by mouth every evening.    . calcipotriene-betamethasone (TACLONEX) ointment Apply 1 application topically daily as needed (for psorasis).     . carboxymethylcellulose (REFRESH TEARS) 0.5 % SOLN Place 1-2 drops into both eyes 3 (three) times daily as needed.    . clobetasol cream (TEMOVATE) [id number] % Apply 1 application topically 2 (two) times daily. (Patient taking differently: Apply 1 application topically 2 (two) times daily as needed (for irriation). ) 30 g 2  . cyanocobalamin (,VITAMIN B-12,) 1000 MCG/ML injection Inject 1,000 mcg into the muscle every 8 (eight) weeks. Every other month    . diclofenac  sodium (VOLTAREN) 1 % GEL Apply 4 g topically 4 (four) times daily. (Patient taking differently: Apply 4 g topically 4 (four) times daily as needed (for pain.). ) 500 g 5  . docusate sodium (COLACE) 100 MG capsule Take 1 capsule (100 mg total) by mouth 2 (two) times daily. (Patient taking differently: Take 100 mg by mouth 2 (two) times daily as needed. ) 60 capsule 0  . DULoxetine (CYMBALTA) 20 MG capsule Take 40 mg by mouth at bedtime.   4  . hydroquinone 4 % cream 1 APPLICATION TOPICALLY DAILY AS NEEDED FOR BLEMISHES.  3  . hydrOXYzine (VISTARIL) 50 MG capsule Take 100 mg by mouth  at bedtime.     . lamoTRIgine (LAMICTAL) 150 MG tablet Take two tablets by mouth daily (Patient taking differently: Take 300 mg by mouth at bedtime. ) 180 tablet 3  . levalbuterol (XOPENEX HFA) 45 MCG/ACT inhaler Inhale 1-2 puffs into the lungs every 6 (six) hours as needed for wheezing. 1 Inhaler 5  . Lysine 1000 MG TABS Take 2,000-4,000 mg by mouth at bedtime. 2000 mg scheduled at bedtime and patient will take 4000 mg if she has outbreak    . mirabegron ER (MYRBETRIQ) 25 MG TB24 tablet Take 1 tablet (25 mg total) by mouth daily. 30 tablet 12  . neomycin-bacitracin-polymyxin (NEOSPORIN) [phone] ointment Apply 1 application topically 4 (four) times daily as needed (for cut/scrapes.).    Marland Kitchen nicotine (NICODERM CQ - DOSED IN MG/24 HOURS) 21 mg/24hr patch Place 1 patch (21 mg total) onto the skin daily. 28 patch 3  . ondansetron (ZOFRAN ODT) 4 MG disintegrating tablet Take 1 tablet (4 mg total) by mouth every 8 (eight) hours as needed for nausea. 12 tablet 0  . polyethylene glycol powder (GLYCOLAX/MIRALAX) powder MIX 17 GRAMS (1 CAPFUL) WITH 4-8 OZ OF LIQUID AND TAKE BY MOUTH TWICE DAILY AS NEEDED (Patient taking differently: MIX 17 GRAMS (1 CAPFUL) WITH 4-8 OZ OF LIQUID AND TAKE BY MOUTH TWICE DAILY AS NEEDED FOR CONSTIPATION) 527 g 0  . temazepam (RESTORIL) 30 MG capsule TAKE ONE TO TWO CAPSULES BY MOUTH NIGHTLY AT BEDTIME  AS NEEDED  (Patient taking differently: TAKE ONE TO TWO CAPSULES BY MOUTH NIGHTLY AT BEDTIME) 60 capsule 0  . tiZANidine (ZANAFLEX) 4 MG tablet TAKE 2 TABLETS (8 MG TOTAL) BY MOUTH 3 (THREE) TIMES DAILY. 180 tablet 1  . zolpidem (AMBIEN) 10 MG tablet Take 10 mg by mouth at bedtime.     . fluconazole (DIFLUCAN) 150 MG tablet Take 1 tablet (150 mg total) by mouth every 3 (three) days. (Patient not taking: Reported on 11/04/2016) 3 tablet 0   Current Facility-Administered Medications on File Prior to Visit  Medication Dose Route Frequency Provider Last Rate Last Dose  . cyanocobalamin ((VITAMIN B-12)) injection 1,000 mcg  1,000 mcg Intramuscular Q30 days [name], NP   1,000 mcg at 08/20/16 1500    BP 122/80   Pulse 98   Temp 98.9 F (37.2 C) (Oral)   Ht 5\' 9"  (1.753 m)   Wt 206 lb (93.4 kg)   LMP 08/23/2014   SpO2 97%   BMI 30.42 kg/m    Objective:   Physical Exam  Constitutional: She appears well-nourished. She does not appear ill.  Neck: Neck supple.  Cardiovascular: Normal rate and regular rhythm.   Pulmonary/Chest: Effort normal and breath sounds normal.  Abdominal: Soft. Normal appearance. There is tenderness in the suprapubic area. There is CVA tenderness.  Skin: Skin is warm and dry.          Assessment & Plan:

## 2016-11-04 NOTE — Assessment & Plan Note (Addendum)
Following with Urology and Infectious Disease. Suspect pain is residual from retained stone based off of renal US in late November. Currently on Macrobid. Lab work completed Monday much improved and stable.  Discussed precautions when traveling such as compliance to antibiotics, hydration, bathroom breaks. Refill provided for Toradol given pain. Renal function stable. Overall looks much improved and stable.

## 2016-11-04 NOTE — Patient Instructions (Signed)
I sent a refill of Toradol to your pharmacy. Use this sparingly.  I sent a refill of Flomax to your pharmacy. Take 1 capsule by mouth once daily.  Continue Macrobid 100 mg once daily as directed.  Ensure you are staying hydrated with water.   Do not go on your trip if you start to feel increased pain, high fevers (above 101), nausea, etc.  It was a pleasure to see you today! Merry Christmas!

## 2016-11-04 NOTE — Progress Notes (Signed)
Pre visit review using our clinic review tool, if applicable. No additional management support is needed unless otherwise documented below in the visit note. 

## 2016-11-10 ENCOUNTER — Other Ambulatory Visit: Payer: Self-pay | Admitting: Urology

## 2016-11-24 ENCOUNTER — Encounter: Payer: Self-pay | Admitting: Primary Care

## 2016-11-24 ENCOUNTER — Telehealth (HOSPITAL_COMMUNITY): Payer: Self-pay

## 2016-11-24 NOTE — Telephone Encounter (Signed)
Patient has a appointment with you on 1/24, she is a former patient at your other office. She is calling for a refill on Zolpidem 10 mg. Patient has not been seen in this office yet. Please review and advise, thank you.

## 2016-11-25 ENCOUNTER — Other Ambulatory Visit: Payer: Self-pay | Admitting: Primary Care

## 2016-11-25 ENCOUNTER — Ambulatory Visit: Payer: Self-pay | Admitting: Urology

## 2016-11-25 DIAGNOSIS — G35 Multiple sclerosis: Secondary | ICD-10-CM

## 2016-12-08 ENCOUNTER — Ambulatory Visit: Payer: PPO | Admitting: Speech Pathology

## 2016-12-08 NOTE — Telephone Encounter (Signed)
Okay to refill her Ambien for a month with 3 refills.

## 2016-12-09 ENCOUNTER — Encounter (HOSPITAL_COMMUNITY): Payer: Self-pay | Admitting: Psychiatry

## 2016-12-09 ENCOUNTER — Ambulatory Visit (INDEPENDENT_AMBULATORY_CARE_PROVIDER_SITE_OTHER): Payer: PPO | Admitting: Psychiatry

## 2016-12-09 ENCOUNTER — Ambulatory Visit (HOSPITAL_COMMUNITY): Payer: Self-pay | Admitting: Psychiatry

## 2016-12-09 VITALS — BP 127/82 | HR 108 | Resp 14 | Ht 69.0 in | Wt 207.0 lb

## 2016-12-09 DIAGNOSIS — F3130 Bipolar disorder, current episode depressed, mild or moderate severity, unspecified: Secondary | ICD-10-CM

## 2016-12-09 DIAGNOSIS — Z7982 Long term (current) use of aspirin: Secondary | ICD-10-CM

## 2016-12-09 DIAGNOSIS — Z9889 Other specified postprocedural states: Secondary | ICD-10-CM | POA: Diagnosis not present

## 2016-12-09 DIAGNOSIS — Z808 Family history of malignant neoplasm of other organs or systems: Secondary | ICD-10-CM | POA: Diagnosis not present

## 2016-12-09 DIAGNOSIS — Z8249 Family history of ischemic heart disease and other diseases of the circulatory system: Secondary | ICD-10-CM | POA: Diagnosis not present

## 2016-12-09 DIAGNOSIS — Z87891 Personal history of nicotine dependence: Secondary | ICD-10-CM

## 2016-12-09 DIAGNOSIS — Z79899 Other long term (current) drug therapy: Secondary | ICD-10-CM

## 2016-12-09 DIAGNOSIS — Z888 Allergy status to other drugs, medicaments and biological substances status: Secondary | ICD-10-CM

## 2016-12-09 DIAGNOSIS — Z882 Allergy status to sulfonamides status: Secondary | ICD-10-CM

## 2016-12-09 MED ORDER — LAMOTRIGINE 150 MG PO TABS: 150.0000 mg | ORAL_TABLET | Freq: Every day | ORAL | 5 refills | Status: DC

## 2016-12-09 MED ORDER — TEMAZEPAM 30 MG PO CAPS: ORAL_CAPSULE | ORAL | 0 refills | Status: DC

## 2016-12-09 MED ORDER — ZOLPIDEM TARTRATE ER 12.5 MG PO TBCR: 12.5000 mg | EXTENDED_RELEASE_TABLET | Freq: Every evening | ORAL | 5 refills | Status: DC | PRN

## 2016-12-09 MED ORDER — HYDROXYZINE PAMOATE 50 MG PO CAPS: 100.0000 mg | ORAL_CAPSULE | Freq: Every day | ORAL | 5 refills | Status: DC

## 2016-12-09 MED ORDER — DULOXETINE HCL 20 MG PO CPEP: 40.0000 mg | ORAL_CAPSULE | Freq: Every day | ORAL | 4 refills | Status: DC

## 2016-12-09 NOTE — Progress Notes (Signed)
Psychiatric Initial Adult Assessment   Patient Identification: Emily Moon MRN:  [id number] Date of Evaluation:  12/09/2016 Referral Source: From the community Chief Complaint:   Visit Diagnos  bipolar disorder This patient is a 56 year old white female who is single carries a diagnosis of bipolar disorder. She is well known by me as she was in my practice and was seen in a different office by me 2 months ago. She's change the setting simply because of insurance issues. The patient been doing fairly well except for the fact that she was hospitalized for sepsis and kidney stone. She now is pretty much recovered except for sleep still seems to be disturbed. The patient's mood is unchanged. She is not depressed nor she manic. She takes a number psychotropic medications. Patient has multiple sclerosis and takes medicines for that as well. Generally she is eating well. She lives with her sister who she is a conflictual relationship with. The patient had a choice she would not live with her sister the patient is on disability and can't imagine going into her work setting at this time. The patient is no evidence of psychosis. Medically she is fairly stable. She hasn't had a recent MS flare. She's got some right hip pain. She's chronic bladder issues. She has a urinate every few hours. The patient is not suicidal nor she homicidal. History of Present Illness:    Associated Signs/Symptoms: Depression Symptoms:  hopelessness, (Hypo) Manic Symptoms:   Anxiety Symptoms:   Psychotic Symptoms:   PTSD Symptoms:   Past Psychiatric History: The patient was seen in a psychiatric office and takes multiple medications she's not been recently hospitalized.  Previous Psychotropic Medications: Yes   Substance Abuse History in the last 12 months:  No.  Consequences of Substance Abuse: Negative  Past Medical History:  Past Medical History:  Diagnosis Date  . Arthritis    osteo  . Asthma   . Bipolar  disorder ([place]) 05/21/14  . Cataracts, bilateral   . DDD (degenerative disc disease), cervical    also back  . Depression   . Dizziness    Positional  . Edema    feet/legs  . Fibromyalgia syndrome   . Fungal infection    Finger nails  . GERD (gastroesophageal reflux disease)   . Gout   . Headache    seasonal allergies  . Heart palpitations   . 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 Hypercholesterolemia   . Multiple sclerosis (HCC)    weakness  . Nephrolithiasis    kidney stones  . Osteoporosis    osteoarthritis  . Pneumonia   . PONV (postoperative nausea and vomiting)    no problem after cataract surgery  . Psoriasis   . Renal stone   . Shortness of breath dyspnea    wheezing  . Sleep apnea 2012   sleep study / slight, no interventions  . Urinary frequency     Past Surgical History:  Procedure Laterality Date  . CATARACT EXTRACTION W/PHACO Left 05/21/2015   Procedure: CATARACT EXTRACTION PHACO AND INTRAOCULAR LENS PLACEMENT (IOC);  Surgeon: Birder Robson, MD;  Location: [facility];  Service: Ophthalmology;  Laterality: Left;  [place] 00:35 AP% 22.9 CDE 8.11 fluid pack lot [id number] H  . CATARACT EXTRACTION W/PHACO Right 06/04/2015   Procedure: CATARACT EXTRACTION PHACO AND INTRAOCULAR LENS PLACEMENT (IOC);  Surgeon: Birder Robson, MD;  Location: [facility];  Service: Ophthalmology;  Laterality: Right;  US:00:48 AP%: 10.5 CDE:5.08 Fluid lot [id number] H  . CYSTOSCOPY/URETEROSCOPY/HOLMIUM LASER/STENT PLACEMENT Bilateral 09/22/2016  Procedure: CYSTOSCOPY/URETEROSCOPY/HOLMIUM LASER/STENT PLACEMENT;  Surgeon: Hollice Espy, MD;  Location: [facility];  Service: Urology;  Laterality: Bilateral;  . EYE SURGERY  2015   tissue biopsy  . FOOT SURGERY  2015  . JOINT REPLACEMENT Left 2013   hip replacement  . LITHOTRIPSY    . PTOSIS REPAIR Bilateral 02/18/2016   Procedure: BILATERAL PTOSIS REPAIR UPPER EYELIDS;  Surgeon: Karle Starch, MD;  Location: [place];  Service:  Ophthalmology;  Laterality: Bilateral;  LEAVE PT EARLY AM  . thumb surgery Right   . TONSILLECTOMY  1973    Family Psychiatric History:   Family History:  Family History  Problem Relation Age of Onset  . Cancer Father     Abdomen with mastasis  . Cancer Mother   . Heart disease Mother   . Kidney disease Neg Hx   . Bladder Cancer Neg Hx   . Prostate cancer Neg Hx     Social History:   Social History   Social History  . Marital status: Divorced    Spouse name: N/A  . Number of children: 1  . Years of education: N/A   Occupational History  . 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 Smoking status: Former Smoker    Years: 25.00    Types: Cigarettes    Quit date: 11/16/2012  . Smokeless tobacco: Never Used     Comment: recenty 04/26/2014  . Alcohol use No  . Drug use: Yes    Types: Methylphenidate  . Sexual activity: No   Other Topics Concern  . None   Social History Narrative  . None    Additional Social History:   Allergies:   Allergies  Allergen Reactions  . Albuterol Shortness Of Breath and Other (See Comments)    Makes pt feel jittery/ tacycardic  . Halcion [Triazolam] Other (See Comments)    Dizziness,headaches,bladder problems  . Levaquin [Levofloxacin In D5w] Diarrhea and Itching    Shoulder pain  . Naproxen Sodium Swelling    Patient tolerates in small doses  . Tylenol [Acetaminophen] Swelling    Patient tolerates in small doses  . Cefaclor Other (See Comments)    Doesn't remember---unsure if actually allergic   . Diclofenac Sodium Other (See Comments)    "made very sick"  . Sulfa Antibiotics Itching    Unsure of reaction possibly itching  . Tramadol Itching and Nausea And Vomiting  . Aripiprazole Other (See Comments)    Muscle tension/cramping  . Ibuprofen Swelling    Patient tolerates in small doses    Metabolic Disorder Labs: Lab Results  Component Value Date   HGBA1C 6.0 03/24/2016   No results  found for: PROLACTIN Lab Results  Component Value Date   CHOL 327 (H) 03/24/2016   TRIG 340.0 (H) 03/24/2016   HDL 46.60 03/24/2016   CHOLHDL 7 03/24/2016   VLDL 68.0 (H) 03/24/2016   LDLCALC 223 (H) 11/13/2014     Current Medications: Current Outpatient Prescriptions  Medication Sig Dispense Refill  . ASPERCREME LIDOCAINE EX Apply 1 application topically 4 (four) times daily as needed (for pain.).     Marland Kitchen aspirin 325 MG tablet Take 325 mg by mouth every 4 (four) hours as needed for headache.     . Biotin 5000 MCG TABS Take 10,000 mcg by mouth every evening.    . calcipotriene-betamethasone (TACLONEX) ointment Apply 1 application topically daily as needed (for psorasis).     . carboxymethylcellulose (REFRESH  TEARS) 0.5 % SOLN Place 1-2 drops into both eyes 3 (three) times daily as needed.    . clobetasol cream (TEMOVATE) [id number] % Apply 1 application topically 2 (two) times daily. (Patient taking differently: Apply 1 application topically 2 (two) times daily as needed (for irriation). ) 30 g 2  . cyanocobalamin (,VITAMIN B-12,) 1000 MCG/ML injection Inject 1,000 mcg into the muscle every 8 (eight) weeks. Every other month    . diclofenac sodium (VOLTAREN) 1 % GEL Apply 4 g topically 4 (four) times daily. (Patient taking differently: Apply 4 g topically 4 (four) times daily as needed (for pain.). ) 500 g 5  . docusate sodium (COLACE) 100 MG capsule Take 1 capsule (100 mg total) by mouth 2 (two) times daily. (Patient taking differently: Take 100 mg by mouth 2 (two) times daily as needed. ) 60 capsule 0  . DULoxetine (CYMBALTA) 20 MG capsule Take 2 capsules (40 mg total) by mouth at bedtime. 60 capsule 4  . fluconazole (DIFLUCAN) 150 MG tablet Take 1 tablet (150 mg total) by mouth every 3 (three) days. (Patient not taking: Reported on 11/04/2016) 3 tablet 0  . HYDROcodone-acetaminophen (NORCO) 10-325 MG tablet Take 1 tablet by mouth every 8 (eight) hours as needed for severe pain. 90 tablet 0  .  hydroquinone 4 % cream 1 APPLICATION TOPICALLY DAILY AS NEEDED FOR BLEMISHES.  3  . hydrOXYzine (VISTARIL) 50 MG capsule Take 2 capsules (100 mg total) by mouth at bedtime. 60 capsule 5  . ketorolac (TORADOL) 10 MG tablet Take 1 tablet (10 mg total) by mouth 2 (two) times daily as needed for severe pain. 60 tablet 0  . lamoTRIgine (LAMICTAL) 150 MG tablet Take 1 tablet (150 mg total) by mouth at bedtime. 60 tablet 5  . levalbuterol (XOPENEX HFA) 45 MCG/ACT inhaler Inhale 1-2 puffs into the lungs every 6 (six) hours as needed for wheezing. 1 Inhaler 5  . Lysine 1000 MG TABS Take 2,000-4,000 mg by mouth at bedtime. 2000 mg scheduled at bedtime and patient will take 4000 mg if she has outbreak    . mirabegron ER (MYRBETRIQ) 25 MG TB24 tablet Take 1 tablet (25 mg total) by mouth daily. 30 tablet 12  . neomycin-bacitracin-polymyxin (NEOSPORIN) [phone] ointment Apply 1 application topically 4 (four) times daily as needed (for cut/scrapes.).    Marland Kitchen nicotine (NICODERM CQ - DOSED IN MG/24 HOURS) 21 mg/24hr patch Place 1 patch (21 mg total) onto the skin daily. 28 patch 3  . nitrofurantoin, macrocrystal-monohydrate, (MACROBID) 100 MG capsule Take 100 mg by mouth daily.     . ondansetron (ZOFRAN ODT) 4 MG disintegrating tablet Take 1 tablet (4 mg total) by mouth every 8 (eight) hours as needed for nausea. 12 tablet 0  . polyethylene glycol powder (GLYCOLAX/MIRALAX) powder MIX 17 GRAMS (1 CAPFUL) WITH 4-8 OZ OF LIQUID AND TAKE BY MOUTH TWICE DAILY AS NEEDED (Patient taking differently: MIX 17 GRAMS (1 CAPFUL) WITH 4-8 OZ OF LIQUID AND TAKE BY MOUTH TWICE DAILY AS NEEDED FOR CONSTIPATION) 527 g 0  . tamsulosin (FLOMAX) 0.4 MG CAPS capsule Take 1 capsule (0.4 mg total) by mouth daily. 30 capsule 0  . tamsulosin (FLOMAX) 0.4 MG CAPS capsule TAKE 1 CAPSULE (0.4 MG TOTAL) BY MOUTH DAILY. 30 capsule 0  . temazepam (RESTORIL) 30 MG capsule TAKE ONE TO TWO CAPSULES BY MOUTH NIGHTLY AT BEDTIME 30 capsule 0  . tiZANidine  (ZANAFLEX) 4 MG tablet TAKE 2 TABLETS (8 MG TOTAL) BY MOUTH 3 (THREE)  TIMES DAILY. 180 tablet 1  . zolpidem (AMBIEN CR) 12.5 MG CR tablet Take 1 tablet (12.5 mg total) by mouth at bedtime as needed for sleep. 30 tablet 5  . zolpidem (AMBIEN) 10 MG tablet Take 10 mg by mouth at bedtime.      Current Facility-Administered Medications  Medication Dose Route Frequency Provider Last Rate Last Dose  . cyanocobalamin ((VITAMIN B-12)) injection 1,000 mcg  1,000 mcg Intramuscular Q30 days [name], NP   1,000 mcg at 08/20/16 1500    Neurologic: Headache: No Seizure: No Paresthesias:No  Musculoskeletal: Strength & Muscle Tone: within normal limits Gait & Station: normal Patient leans: N/A  Psychiatric Specialty Exam: ROS  Blood pressure 127/82, pulse (!) 108, resp. rate 14, height 5\' 9"  (1.753 m), weight 207 lb (93.9 kg), last menstrual period 08/23/2014.Body mass index is 30.57 kg/m.  General Appearance: Casual  Eye Contact:  Good  Speech:  Clear and Coherent  Volume:  Normal  Mood:  Negative  Affect:  Appropriate  Thought Process:  Goal Directed  Orientation:  NA  Thought Content:  Logical  Suicidal Thoughts:  No  Homicidal Thoughts:  No  Memory:  Negative  Judgement:  Good  Insight:  Good  Psychomotor Activity:  Normal  Concentration:    Recall:    Fund of Knowledge:Good  Language: Good  Akathisia:  No  Handed:  Right  AIMS (if indicated):    Assets:  Desire for Improvement  ADL's:  Intact  Cognition: WNL  Sleep:      Treatment Plan Summary: At this time this patient will continue taking all the medications previously prescribed. This includes Lamictal 150 mg and she takes 1 a day. She'll continue taking Cymbalta 20 mg taking 2 a day. She'll continue taking Ambien but we'll change it from a standard 10 mg pill to a 12.5 CR. She says she is waking up too early. The patient also takes Restoril to help her sleep. We'll continue that as well. Overall the patient is  stable. She is no different that she was over the last year. Last her to sign a release to get her past evaluations and her last progress note from [facility].   Haskel Schroeder, MD 1/24/20183:58 PM

## 2016-12-11 ENCOUNTER — Other Ambulatory Visit: Payer: Self-pay | Admitting: Primary Care

## 2016-12-12 NOTE — Telephone Encounter (Signed)
This medication was not prescribed by me. No record of it in her med list. If this was prescribed by Urology, then they will need to refill.

## 2016-12-14 ENCOUNTER — Other Ambulatory Visit (HOSPITAL_COMMUNITY): Payer: Self-pay

## 2016-12-14 MED ORDER — TEMAZEPAM 30 MG PO CAPS: ORAL_CAPSULE | ORAL | 2 refills | Status: DC

## 2016-12-15 ENCOUNTER — Other Ambulatory Visit: Payer: Self-pay | Admitting: Primary Care

## 2016-12-15 ENCOUNTER — Ambulatory Visit: Payer: Self-pay

## 2016-12-15 DIAGNOSIS — N200 Calculus of kidney: Secondary | ICD-10-CM

## 2016-12-15 NOTE — Telephone Encounter (Signed)
Spoken and notified patient of [name]'s comments. Patient verbalized understanding.  Patient will resquest refill from the Urologist.

## 2016-12-15 NOTE — Telephone Encounter (Signed)
Ok to refill? Electronically refill request for   tamsulosin (FLOMAX) 0.4 MG CAPS capsule  Last prescribed and seen on 11/04/2016.

## 2016-12-16 ENCOUNTER — Other Ambulatory Visit: Payer: Self-pay

## 2016-12-16 ENCOUNTER — Ambulatory Visit (INDEPENDENT_AMBULATORY_CARE_PROVIDER_SITE_OTHER): Payer: PPO | Admitting: Family Medicine

## 2016-12-16 ENCOUNTER — Encounter: Payer: Self-pay | Admitting: Family Medicine

## 2016-12-16 ENCOUNTER — Other Ambulatory Visit: Payer: Self-pay | Admitting: Primary Care

## 2016-12-16 VITALS — BP 110/70 | HR 111 | Temp 99.1°F | Ht 69.0 in | Wt 208.5 lb

## 2016-12-16 DIAGNOSIS — Z1159 Encounter for screening for other viral diseases: Secondary | ICD-10-CM

## 2016-12-16 DIAGNOSIS — E785 Hyperlipidemia, unspecified: Secondary | ICD-10-CM

## 2016-12-16 DIAGNOSIS — Q6589 Other specified congenital deformities of hip: Secondary | ICD-10-CM

## 2016-12-16 DIAGNOSIS — M7061 Trochanteric bursitis, right hip: Secondary | ICD-10-CM | POA: Diagnosis not present

## 2016-12-16 DIAGNOSIS — E559 Vitamin D deficiency, unspecified: Secondary | ICD-10-CM

## 2016-12-16 DIAGNOSIS — E538 Deficiency of other specified B group vitamins: Secondary | ICD-10-CM

## 2016-12-16 DIAGNOSIS — R7303 Prediabetes: Secondary | ICD-10-CM

## 2016-12-16 MED ORDER — TIZANIDINE HCL 4 MG PO TABS: 4.0000 mg | ORAL_TABLET | Freq: Three times a day (TID) | ORAL | 1 refills | Status: DC | PRN

## 2016-12-16 MED ORDER — CLOBETASOL PROPIONATE 0.05 % EX CREA: 1.0000 "application " | TOPICAL_CREAM | Freq: Two times a day (BID) | CUTANEOUS | 2 refills | Status: DC

## 2016-12-16 MED ORDER — METHYLPREDNISOLONE ACETATE 40 MG/ML IJ SUSP
80.0000 mg | Freq: Once | INTRAMUSCULAR | Status: AC
  Administered 2016-12-16: 80 mg via INTRA_ARTICULAR

## 2016-12-16 NOTE — Addendum Note (Signed)
Addended by: Carter Kitten on: 12/16/2016 12:10 PM   Modules accepted: Orders

## 2016-12-16 NOTE — Telephone Encounter (Signed)
Spoken and notified patient of [name]'s comments. Patient verbalized understanding. 

## 2016-12-16 NOTE — Telephone Encounter (Signed)
This was prescribed by her Urologist, she needs to contact their office for refills.

## 2016-12-16 NOTE — Progress Notes (Signed)
Pre visit review using our clinic review tool, if applicable. No additional management support is needed unless otherwise documented below in the visit note. 

## 2016-12-16 NOTE — Progress Notes (Signed)
Dr. Frederico Hamman T. Ryken Paschal, MD, [facility] [place], [address] Phone: [phone] Fax: [phone]  12/16/2016  Patient: Emily Moon, MRN: [id number], DOB: 05/01/1961, 56 y.o.  Primary Physician:  Sheral Flow, NP   Chief Complaint  Patient presents with  . Hip Pain    Right   Subjective:   Emily Moon is a 56 y.o. very pleasant female patient who presents with the following:  R lateral hip: GTB  Known congenital hip dysplasia b s/p L THA with ongoing R symptoms and intermittent intraarticular and GTB on the R, now with primary GTB and some intraarticular hip pain.  Past Medical History, Surgical History, Social History, Family History, Problem List, Medications, and Allergies have been reviewed and updated if relevant.  Patient Active Problem List   Diagnosis Date Noted  . Bipolar disorder ([place]) 05/21/2014    Priority: High  . MULTIPLE SCLEROSIS, PROGRESSIVE/RELAPSING 08/28/2010    Priority: High  . Chronic pain syndrome 01/07/2011    Priority: Medium  . Sepsis ([place]) 10/10/2016  . B12 deficiency 05/22/2016  . Dizziness 03/16/2016  . Back pain 10/31/2014  . 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 Insomnia 09/01/2011  . Obesity 05/04/2011  . Perimenopausal vasomotor symptoms 02/05/2011  . HLD (hyperlipidemia) 01/20/2011  . SMOKER 01/20/2011  . RENAL CALCULUS, RECURRENT 11/13/2010  . GERD 08/28/2010  . Osteoarthritis 08/28/2010  . NEPHROLITHIASIS, HX OF 08/28/2010    Past Medical History:  Diagnosis Date  . Arthritis    osteo  . Asthma   . Bipolar disorder ([place]) 05/21/14  . Cataracts, bilateral   . DDD (degenerative disc disease), cervical    also back  . Depression   . Dizziness    Positional  . Edema    feet/legs  . Fibromyalgia syndrome   . Fungal infection    Finger  nails  . GERD (gastroesophageal reflux disease)   . Gout   . Headache    seasonal allergies  . Heart palpitations   . 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 Hypercholesterolemia   . Multiple sclerosis (H[facility])    weakness  . Nephrolithiasis    kidney stones  . Osteoporosis    osteoarthritis  . Pneumonia   . PONV (postoperative nausea and vomiting)    no problem after cataract surgery  . Psoriasis   . Renal stone   . Shortness of breath dyspnea    wheezing  . Sleep apnea 2012   sleep study / slight, no interventions  . Urinary frequency     Past Surgical History:  Procedure Laterality Date  . CATARACT EXTRACTION W/PHACO Left 05/21/2015   Procedure: CATARACT EXTRACTION PHACO AND INTRAOCULAR LENS PLACEMENT (IOC);  Surgeon: Birder Robson, MD;  Location: [facility];  Service: Ophthalmology;  Laterality: Left;  [place] 00:35 AP% 22.9 CDE 8.11 fluid pack lot [id number]  . CATARACT EXTRACTION W/PHACO Right 06/04/2015   Procedure: CATARACT EXTRACTION PHACO AND INTRAOCULAR LENS PLACEMENT (IOC);  Surgeon: Birder Robson, MD;  Location: [facility];  Service: Ophthalmology;  Laterality: Right;  US:00:48 AP%: 10.5 CDE:5.08 Fluid lot [id number]  . CYSTOSCOPY/URETEROSCOPY/HOLMIUM LASER/STENT PLACEMENT Bilateral 09/22/2016   Procedure: CYSTOSCOPY/URETEROSCOPY/HOLMIUM LASER/STENT PLACEMENT;  Surgeon: Hollice Espy, MD;  Location: [facility];  Service: Urology;  Laterality: Bilateral;  . EYE SURGERY  2015   tissue biopsy  . FOOT SURGERY  2015  . JOINT REPLACEMENT  Left 2013   hip replacement  . LITHOTRIPSY    . PTOSIS REPAIR Bilateral 02/18/2016   Procedure: BILATERAL PTOSIS REPAIR UPPER EYELIDS;  Surgeon: Karle Starch, MD;  Location: [place];  Service: Ophthalmology;  Laterality: Bilateral;  LEAVE PT EARLY AM  . thumb surgery Right   . TONSILLECTOMY  1973    Social History   Social History  . Marital status: Divorced    Spouse name: N/A  . Number of children: 1  . Years of  education: N/A   Occupational History  . Customer Service Rep at [employer]  . Smoking status: Former Smoker    Years: 25.00    Types: Cigarettes    Quit date: 11/16/2012  . Smokeless tobacco: Never Used     Comment: recenty 04/26/2014  . Alcohol use No  . Drug use: Yes    Types: Methylphenidate  . Sexual activity: No   Other Topics Concern  . Not on file   Social History Narrative  . No narrative on file    Family History  Problem Relation Age of Onset  . Cancer Father     Abdomen with mastasis  . Cancer Mother   . Heart disease Mother   . Kidney disease Neg Hx   . Bladder Cancer Neg Hx   . Prostate cancer Neg Hx     Allergies  Allergen Reactions  . Albuterol Shortness Of Breath and Other (See Comments)    Makes pt feel jittery/ tacycardic  . Halcion [Triazolam] Other (See Comments)    Dizziness,headaches,bladder problems  . Levaquin [Levofloxacin In D5w] Diarrhea and Itching    Shoulder pain  . Naproxen Sodium Swelling    Patient tolerates in small doses  . Tylenol [Acetaminophen] Swelling    Patient tolerates in small doses  . Cefaclor Other (See Comments)    Doesn't remember---unsure if actually allergic   . Diclofenac Sodium Other (See Comments)    "made very sick"  . Sulfa Antibiotics Itching    Unsure of reaction possibly itching  . Tramadol Itching and Nausea And Vomiting  . Aripiprazole Other (See Comments)    Muscle tension/cramping  . Ibuprofen Swelling    Patient tolerates in small doses    Medication list reviewed and updated in full in [facility].  GEN: No fevers, chills. Nontoxic. Primarily MSK c/o today. MSK: Detailed in the HPI GI: tolerating PO intake without difficulty Neuro: No numbness, parasthesias, or tingling associated. Otherwise the pertinent positives of the ROS are noted above.   Objective:   BP 110/70   Pulse (!) 111   Temp 99.1 F (37.3 C) (Oral)   Ht 5\' 9"  (1.753 m)   Wt 208  lb 8 oz (94.6 kg)   LMP 08/23/2014   BMI 30.79 kg/m    GEN: WDWN, NAD, Non-toxic, Alert & Oriented x 3 HEENT: Atraumatic, Normocephalic.  Ears and Nose: No external deformity. EXTR: No clubbing/cyanosis/edema NEURO: Normal gait.  PSYCH: Normally interactive. Conversant. Not depressed or anxious appearing.  Calm demeanor.   HIP EXAM: SIDE: R ROM: Abduction, Flexion, Internal and External range of motion: mild restriction all directions Pain with terminal IROM and EROM: yes GTB: TTP SLR: NEG Knees: No effusion FABER: NT REVERSE FABER: NT, neg Piriformis: NT at direct palpation Str: flexion: 5/5 abduction: 5/5 adduction: 5/5 Strength testing non-tender     Radiology: No results found.  Assessment and Plan:   Trochanteric bursitis, right hip  Hip dysplasia, congenital  Along with R hip OA, inject GTB today for pain management.  Fluoro guided hip injection done in 06/2016  Trochanteric Bursitis Injection, R Verbal consent obtained. Risks (including infection, potential atrophy), benefits, and alternatives reviewed. Greater trochanter sterilely prepped with Chloraprep. Ethyl Chloride used for anesthesia. 8 cc of Lidocaine 1% injected with 2 mL of Depo-Medrol 40 mg into trochanteric bursa at area of maximal tenderness at greater trochanter. Needle taken to bone to troch bursa, flows easily. Bursa massaged. No bleeding and no complications. Decreased pain after injection. Needle: 22 gauge spinal needle   Follow-up: No Follow-up on file.  Meds ordered this encounter  Medications  . Cholecalciferol (VITAMIN D3) 10000 units capsule    Sig: Take 10,000 Units by mouth daily.  . phenazopyridine (AZO-TABS) 95 MG tablet    Sig: Take 95 mg by mouth 3 (three) times daily as needed for pain.  . Biotin 10000 MCG TABS    Sig: Take 1 tablet by mouth daily.  . clobetasol cream (TEMOVATE) 0.05 %    Sig: Apply 1 application topically 2 (two) times daily.    Dispense:  30 g    Refill:   2  . tiZANidine (ZANAFLEX) 4 MG tablet    Sig: Take 1 tablet (4 mg total) by mouth every 8 (eight) hours as needed for muscle spasms.    Dispense:  180 tablet    Refill:  1   Medications Discontinued During This Encounter  Medication Reason  . nitrofurantoin, macrocrystal-monohydrate, (MACROBID) 100 MG capsule Completed Course  . Biotin 5000 MCG TABS Completed Course  . clobetasol cream (TEMOVATE) 0.05 % Reorder  . tiZANidine (ZANAFLEX) 4 MG tablet Reorder   No orders of the defined types were placed in this encounter.   Signed,  Maud Deed. Marga Gramajo, MD   Allergies as of 12/16/2016      Reactions   Albuterol Shortness Of Breath, Other (See Comments)   Makes pt feel jittery/ tacycardic   Halcion [triazolam] Other (See Comments)   Dizziness,headaches,bladder problems   Levaquin [levofloxacin In D5w] Diarrhea, Itching   Shoulder pain   Naproxen Sodium Swelling   Patient tolerates in small doses   Tylenol [acetaminophen] Swelling   Patient tolerates in small doses   Cefaclor Other (See Comments)   Doesn't remember---unsure if actually allergic    Diclofenac Sodium Other (See Comments)   "made very sick"   Sulfa Antibiotics Itching   Unsure of reaction possibly itching   Tramadol Itching, Nausea And Vomiting   Aripiprazole Other (See Comments)   Muscle tension/cramping   Ibuprofen Swelling   Patient tolerates in small doses      Medication List       Accurate as of 12/16/16 11:39 AM. Always use your most recent med list.          ASPERCREME LIDOCAINE EX Apply 1 application topically 4 (four) times daily as needed (for pain.).   aspirin 325 MG tablet Take 325 mg by mouth every 4 (four) hours as needed for headache.   AZO-TABS 95 MG tablet Generic drug:  phenazopyridine Take 95 mg by mouth 3 (three) times daily as needed for pain.   Biotin 10000 MCG Tabs Take 1 tablet by mouth daily.   calcipotriene-betamethasone ointment Commonly known as:  TACLONEX Apply 1  application topically daily as needed (for psorasis).   clobetasol cream 0.05 % Commonly known as:  TEMOVATE Apply 1 application topically 2 (two) times daily.   cyanocobalamin 1000 MCG/ML injection  Commonly known as:  (VITAMIN B-12) Inject 1,000 mcg into the muscle every 8 (eight) weeks. Every other month   diclofenac sodium 1 % Gel Commonly known as:  VOLTAREN Apply 4 g topically 4 (four) times daily.   docusate sodium 100 MG capsule Commonly known as:  COLACE Take 1 capsule (100 mg total) by mouth 2 (two) times daily.   DULoxetine 20 MG capsule Commonly known as:  CYMBALTA Take 2 capsules (40 mg total) by mouth at bedtime.   fluconazole 150 MG tablet Commonly known as:  DIFLUCAN Take 1 tablet (150 mg total) by mouth every 3 (three) days.   HYDROcodone-acetaminophen 10-325 MG tablet Commonly known as:  NORCO Take 1 tablet by mouth every 8 (eight) hours as needed for severe pain.   hydroquinone 4 % cream 1 APPLICATION TOPICALLY DAILY AS NEEDED FOR BLEMISHES.   hydrOXYzine 50 MG capsule Commonly known as:  VISTARIL Take 2 capsules (100 mg total) by mouth at bedtime.   ketorolac 10 MG tablet Commonly known as:  TORADOL Take 1 tablet (10 mg total) by mouth 2 (two) times daily as needed for severe pain.   lamoTRIgine 150 MG tablet Commonly known as:  LAMICTAL Take 1 tablet (150 mg total) by mouth at bedtime.   levalbuterol 45 MCG/ACT inhaler Commonly known as:  XOPENEX HFA Inhale 1-2 puffs into the lungs every 6 (six) hours as needed for wheezing.   Lysine 1000 MG Tabs Take 2,000-4,000 mg by mouth at bedtime. 2000 mg scheduled at bedtime and patient will take 4000 mg if she has outbreak   mirabegron ER 25 MG Tb24 tablet Commonly known as:  MYRBETRIQ Take 1 tablet (25 mg total) by mouth daily.   neomycin-bacitracin-polymyxin [phone] ointment Apply 1 application topically 4 (four) times daily as needed (for cut/scrapes.).   nicotine 21 mg/24hr patch Commonly  known as:  NICODERM CQ - dosed in mg/24 hours Place 1 patch (21 mg total) onto the skin daily.   ondansetron 4 MG disintegrating tablet Commonly known as:  ZOFRAN ODT Take 1 tablet (4 mg total) by mouth every 8 (eight) hours as needed for nausea.   polyethylene glycol powder powder Commonly known as:  GLYCOLAX/MIRALAX MIX 17 GRAMS (1 CAPFUL) WITH 4-8 OZ OF LIQUID AND TAKE BY MOUTH TWICE DAILY AS NEEDED   REFRESH TEARS 0.5 % Soln Generic drug:  carboxymethylcellulose Place 1-2 drops into both eyes 3 (three) times daily as needed.   tamsulosin 0.4 MG Caps capsule Commonly known as:  FLOMAX Take 1 capsule (0.4 mg total) by mouth daily.   tamsulosin 0.4 MG Caps capsule Commonly known as:  FLOMAX TAKE 1 CAPSULE (0.4 MG TOTAL) BY MOUTH DAILY.   temazepam 30 MG capsule Commonly known as:  RESTORIL TAKE ONE TO TWO CAPSULES BY MOUTH NIGHTLY AT BEDTIME   tiZANidine 4 MG tablet Commonly known as:  ZANAFLEX Take 1 tablet (4 mg total) by mouth every 8 (eight) hours as needed for muscle spasms.   Vitamin D3 10000 units capsule Take 10,000 Units by mouth daily.   zolpidem 10 MG tablet Commonly known as:  AMBIEN Take 10 mg by mouth at bedtime.   zolpidem 12.5 MG CR tablet Commonly known as:  AMBIEN CR Take 1 tablet (12.5 mg total) by mouth at bedtime as needed for sleep.

## 2016-12-17 ENCOUNTER — Other Ambulatory Visit (INDEPENDENT_AMBULATORY_CARE_PROVIDER_SITE_OTHER): Payer: PPO

## 2016-12-17 DIAGNOSIS — R7303 Prediabetes: Secondary | ICD-10-CM | POA: Diagnosis not present

## 2016-12-17 DIAGNOSIS — E538 Deficiency of other specified B group vitamins: Secondary | ICD-10-CM

## 2016-12-17 DIAGNOSIS — Z1159 Encounter for screening for other viral diseases: Secondary | ICD-10-CM

## 2016-12-17 DIAGNOSIS — E559 Vitamin D deficiency, unspecified: Secondary | ICD-10-CM | POA: Diagnosis not present

## 2016-12-17 DIAGNOSIS — E785 Hyperlipidemia, unspecified: Secondary | ICD-10-CM | POA: Diagnosis not present

## 2016-12-17 LAB — LIPID PANEL
CHOLESTEROL: 341 mg/dL — AB (ref 0–200)
HDL: 48.7 mg/dL (ref >39.00)
NonHDL: 292.57
Total CHOL/HDL Ratio: 7
Triglycerides: 302 mg/dL — ABNORMAL HIGH (ref 0.0–149.0)
VLDL: 60.4 mg/dL — ABNORMAL HIGH (ref 0.0–40.0)

## 2016-12-17 LAB — HEMOGLOBIN A1C: HEMOGLOBIN A1C: 6 % (ref 4.6–6.5)

## 2016-12-17 LAB — BASIC METABOLIC PANEL
BUN: 16 mg/dL (ref 6–23)
CHLORIDE: 104 meq/L (ref 96–112)
CO2: 29 meq/L (ref 19–32)
CREATININE: 0.85 mg/dL (ref 0.40–1.20)
Calcium: 9.3 mg/dL (ref 8.4–10.5)
GFR: 73.89 mL/min (ref >60.00)
GLUCOSE: 111 mg/dL — AB (ref 70–99)
Potassium: 4.4 mEq/L (ref 3.5–5.1)
Sodium: 139 mEq/L (ref 135–145)

## 2016-12-17 LAB — VITAMIN D 25 HYDROXY (VIT D DEFICIENCY, FRACTURES): VITD: 64.08 ng/mL (ref 30.00–100.00)

## 2016-12-17 LAB — LDL CHOLESTEROL, DIRECT: Direct LDL: 242 mg/dL

## 2016-12-17 LAB — VITAMIN B12: Vitamin B-12: 247 pg/mL (ref 211–911)

## 2016-12-18 LAB — HEPATITIS C ANTIBODY: HCV AB: NEGATIVE

## 2016-12-22 ENCOUNTER — Other Ambulatory Visit (HOSPITAL_COMMUNITY): Payer: Self-pay

## 2016-12-22 MED ORDER — LAMOTRIGINE 150 MG PO TABS: 300.0000 mg | ORAL_TABLET | Freq: Every day | ORAL | 5 refills | Status: DC

## 2016-12-22 NOTE — Progress Notes (Signed)
Patient called and said the the Lamotrigine 150 mg was sent in wrong - instead of 1 po qhs, she takes 2 po qhs. I confirmed this in her medication history and called Dr. Casimiro Needle. He gave the okay to send in a new order, this was done and the pharmacy was told to d/c previous order. Patient was called and she voiced her understanding.

## 2016-12-23 ENCOUNTER — Other Ambulatory Visit: Payer: Self-pay | Admitting: Primary Care

## 2016-12-23 ENCOUNTER — Ambulatory Visit: Payer: PPO | Admitting: Speech Pathology

## 2016-12-23 DIAGNOSIS — N200 Calculus of kidney: Secondary | ICD-10-CM

## 2016-12-23 NOTE — Telephone Encounter (Signed)
Ok to refill? Electronically refill request for tamsulosin (FLOMAX) 0.4 MG CAPS capsule #30 with no refills. Last prescribed on 11/04/2016. Last seen on 12/16/2016. Next appointment on 12/25/2016.

## 2016-12-23 NOTE — Telephone Encounter (Signed)
Needs authorization through Urology.

## 2016-12-25 ENCOUNTER — Ambulatory Visit (INDEPENDENT_AMBULATORY_CARE_PROVIDER_SITE_OTHER): Payer: PPO | Admitting: Primary Care

## 2016-12-25 ENCOUNTER — Encounter: Payer: Self-pay | Admitting: Primary Care

## 2016-12-25 VITALS — BP 124/84 | HR 88 | Temp 98.7°F | Ht 69.0 in | Wt 207.1 lb

## 2016-12-25 DIAGNOSIS — K219 Gastro-esophageal reflux disease without esophagitis: Secondary | ICD-10-CM

## 2016-12-25 DIAGNOSIS — Z1231 Encounter for screening mammogram for malignant neoplasm of breast: Secondary | ICD-10-CM

## 2016-12-25 DIAGNOSIS — E785 Hyperlipidemia, unspecified: Secondary | ICD-10-CM

## 2016-12-25 DIAGNOSIS — N200 Calculus of kidney: Secondary | ICD-10-CM

## 2016-12-25 DIAGNOSIS — F3170 Bipolar disorder, currently in remission, most recent episode unspecified: Secondary | ICD-10-CM

## 2016-12-25 DIAGNOSIS — G35 Multiple sclerosis: Secondary | ICD-10-CM

## 2016-12-25 DIAGNOSIS — G894 Chronic pain syndrome: Secondary | ICD-10-CM

## 2016-12-25 DIAGNOSIS — E538 Deficiency of other specified B group vitamins: Secondary | ICD-10-CM

## 2016-12-25 DIAGNOSIS — Z0000 Encounter for general adult medical examination without abnormal findings: Secondary | ICD-10-CM | POA: Diagnosis not present

## 2016-12-25 DIAGNOSIS — Z1239 Encounter for other screening for malignant neoplasm of breast: Secondary | ICD-10-CM

## 2016-12-25 DIAGNOSIS — M1990 Unspecified osteoarthritis, unspecified site: Secondary | ICD-10-CM

## 2016-12-25 MED ORDER — EZETIMIBE 10 MG PO TABS: 10.0000 mg | ORAL_TABLET | Freq: Every day | ORAL | 3 refills | Status: DC

## 2016-12-25 MED ORDER — HYDROCODONE-ACETAMINOPHEN 10-325 MG PO TABS: 1.0000 | ORAL_TABLET | Freq: Four times a day (QID) | ORAL | 0 refills | Status: DC | PRN

## 2016-12-25 NOTE — Assessment & Plan Note (Signed)
Improved, but on lower end of normal. Continue monthly B 12 injections.

## 2016-12-25 NOTE — Assessment & Plan Note (Signed)
Overall stable on homeopathic treatment. No PPI or H2 blocker use.

## 2016-12-25 NOTE — Assessment & Plan Note (Signed)
Long history of Norco use for osteoarthritis. Also with history of MS. She does well at the same dose, discussed need for medication and agree that she will need to continue. Discussed the need for ongoing use with supervising physician. Reviewed [place] controlled substance registry and patient compliant. No unusual activity. UDS and controlled substance contract UTD. Three months supply refilled today. Continues to follow with sports medicine.

## 2016-12-25 NOTE — Progress Notes (Signed)
Pre visit review using our clinic review tool, if applicable. No additional management support is needed unless otherwise documented below in the visit note. 

## 2016-12-25 NOTE — Assessment & Plan Note (Signed)
Continues to follow with Neurology, Will be starting with [facility].

## 2016-12-25 NOTE — Assessment & Plan Note (Signed)
Following with psychiatry, feels well managed overall. Continue current regimen.

## 2016-12-25 NOTE — Assessment & Plan Note (Signed)
Tetanus due, does not believe insurance will cover. All other immunizations UTD. Pap due, will complete at next visit in 3 months. Mammogram due, ordered. Discussed the importance of a healthy diet and regular exercise in order for weight loss, and to reduce the risk of other medical diseases. Exam today stable. Labs with hyperlipidemia for which she agrees to treatment after years of refusal. Advanced directives packet provided. All recommendations provided to patient at end of visit.  I have personally reviewed and have noted: 1. The patient's medical and social history 2. Their use of alcohol, tobacco or illicit drugs 3. Their current medications and supplements 4. The patient's functional ability including ADL's, fall  risks, home safety risks and hearing or visual  impairment. 5. Diet and physical activities 6. Evidence for depression or mood disorder

## 2016-12-25 NOTE — Progress Notes (Signed)
Patient ID: Emily Moon, female   DOB: Apr 11, 1961, 56 y.o.   MRN: [id number]  HPI: Emily Moon is a 56 year old female who presents today for [place].  Past Medical History:  Diagnosis Date  . Arthritis    osteo  . Asthma   . Bipolar disorder ([place]) 05/21/14  . Cataracts, bilateral   . DDD (degenerative disc disease), cervical    also back  . Depression   . Dizziness    Positional  . Edema    feet/legs  . Fibromyalgia syndrome   . Fungal infection    Finger nails  . GERD (gastroesophageal reflux disease)   . Gout   . Headache    seasonal allergies  . Heart palpitations   . 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 Hypercholesterolemia   . Multiple sclerosis (HCC)    weakness  . Nephrolithiasis    kidney stones  . Osteoporosis    osteoarthritis  . Pneumonia   . PONV (postoperative nausea and vomiting)    no problem after cataract surgery  . Psoriasis   . Renal stone   . Shortness of breath dyspnea    wheezing  . Sleep apnea 2012   sleep study / slight, no interventions  . Urinary frequency     Current Outpatient Prescriptions  Medication Sig Dispense Refill  . ASPERCREME LIDOCAINE EX Apply 1 application topically 4 (four) times daily as needed (for pain.).     Marland Kitchen aspirin 325 MG tablet Take 325 mg by mouth every 4 (four) hours as needed for headache.     . Biotin 10000 MCG TABS Take 1 tablet by mouth daily.    . calcipotriene-betamethasone (TACLONEX) ointment Apply 1 application topically daily as needed (for psorasis).     . carboxymethylcellulose (REFRESH TEARS) 0.5 % SOLN Place 1-2 drops into both eyes 3 (three) times daily as needed.    . Cholecalciferol (VITAMIN D3) 10000 units capsule Take 10,000 Units by mouth daily.    . clobetasol cream (TEMOVATE) [id number] % Apply 1 application topically 2 (two) times daily. 30 g 2  . cyanocobalamin (,VITAMIN B-12,) 1000 MCG/ML injection Inject 1,000 mcg into the muscle every 8 (eight) weeks. Every other month    . diclofenac  sodium (VOLTAREN) 1 % GEL Apply 4 g topically 4 (four) times daily. (Patient taking differently: Apply 4 g topically 4 (four) times daily as needed (for pain.). ) 500 g 5  . docusate sodium (COLACE) 100 MG capsule Take 1 capsule (100 mg total) by mouth 2 (two) times daily. (Patient taking differently: Take 100 mg by mouth 2 (two) times daily as needed. ) 60 capsule 0  . DULoxetine (CYMBALTA) 20 MG capsule Take 2 capsules (40 mg total) by mouth at bedtime. 60 capsule 4  . HYDROcodone-acetaminophen (NORCO) 10-325 MG tablet Take 1 tablet by mouth every 8 (eight) hours as needed for severe pain. 90 tablet 0  . hydroquinone 4 % cream 1 APPLICATION TOPICALLY DAILY AS NEEDED FOR BLEMISHES.  3  . hydrOXYzine (VISTARIL) 50 MG capsule Take 2 capsules (100 mg total) by mouth at bedtime. 60 capsule 5  . ketorolac (TORADOL) 10 MG tablet Take 1 tablet (10 mg total) by mouth 2 (two) times daily as needed for severe pain. 60 tablet 0  . lamoTRIgine (LAMICTAL) 150 MG tablet Take 2 tablets (300 mg total) by mouth at bedtime. 60 tablet 5  . levalbuterol (XOPENEX HFA) 45 MCG/ACT inhaler Inhale 1-2 puffs into the lungs every 6 (six) hours  as needed for wheezing. 1 Inhaler 5  . Lysine 1000 MG TABS Take 2,000-4,000 mg by mouth at bedtime. 2000 mg scheduled at bedtime and patient will take 4000 mg if she has outbreak    . mirabegron ER (MYRBETRIQ) 25 MG TB24 tablet Take 1 tablet (25 mg total) by mouth daily. 30 tablet 12  . neomycin-bacitracin-polymyxin (NEOSPORIN) [phone] ointment Apply 1 application topically 4 (four) times daily as needed (for cut/scrapes.).    Marland Kitchen nicotine (NICODERM CQ - DOSED IN MG/24 HOURS) 21 mg/24hr patch Place 1 patch (21 mg total) onto the skin daily. 28 patch 3  . ondansetron (ZOFRAN ODT) 4 MG disintegrating tablet Take 1 tablet (4 mg total) by mouth every 8 (eight) hours as needed for nausea. 12 tablet 0  . phenazopyridine (AZO-TABS) 95 MG tablet Take 95 mg by mouth 3 (three) times daily as  needed for pain.    . polyethylene glycol powder (GLYCOLAX/MIRALAX) powder MIX 17 GRAMS (1 CAPFUL) WITH 4-8 OZ OF LIQUID AND TAKE BY MOUTH TWICE DAILY AS NEEDED (Patient taking differently: MIX 17 GRAMS (1 CAPFUL) WITH 4-8 OZ OF LIQUID AND TAKE BY MOUTH TWICE DAILY AS NEEDED FOR CONSTIPATION) 527 g 0  . tamsulosin (FLOMAX) 0.4 MG CAPS capsule TAKE 1 CAPSULE (0.4 MG TOTAL) BY MOUTH DAILY. 30 capsule 0  . temazepam (RESTORIL) 30 MG capsule TAKE ONE TO TWO CAPSULES BY MOUTH NIGHTLY AT BEDTIME 60 capsule 2  . tiZANidine (ZANAFLEX) 4 MG tablet Take 1 tablet (4 mg total) by mouth every 8 (eight) hours as needed for muscle spasms. 180 tablet 1  . zolpidem (AMBIEN) 10 MG tablet Take 10 mg by mouth at bedtime.      Current Facility-Administered Medications  Medication Dose Route Frequency Provider Last Rate Last Dose  . cyanocobalamin ((VITAMIN B-12)) injection 1,000 mcg  1,000 mcg Intramuscular Q30 days [name], NP   1,000 mcg at 08/20/16 1500    Allergies  Allergen Reactions  . Albuterol Shortness Of Breath and Other (See Comments)    Makes pt feel jittery/ tacycardic  . Halcion [Triazolam] Other (See Comments)    Dizziness,headaches,bladder problems  . Levaquin [Levofloxacin In D5w] Diarrhea and Itching    Shoulder pain  . Naproxen Sodium Swelling    Patient tolerates in small doses  . Tylenol [Acetaminophen] Swelling    Patient tolerates in small doses  . Cefaclor Other (See Comments)    Doesn't remember---unsure if actually allergic   . Diclofenac Sodium Other (See Comments)    "made very sick"  . Sulfa Antibiotics Itching    Unsure of reaction possibly itching  . Tramadol Itching and Nausea And Vomiting  . Aripiprazole Other (See Comments)    Muscle tension/cramping  . Ibuprofen Swelling    Patient tolerates in small doses    Family History  Problem Relation Age of Onset  . Cancer Father     Abdomen with mastasis  . Cancer Mother   . Heart disease Mother   . Kidney  disease Neg Hx   . Bladder Cancer Neg Hx   . Prostate cancer Neg Hx     Social History   Social History  . Marital status: Divorced    Spouse name: N/A  . Number of children: 1  . Years of education: N/A   Occupational History  . Customer Service Rep at [employer]  . Smoking status: Former Smoker    Years: 25.00    Types:  Cigarettes    Quit date: 11/16/2012  . Smokeless tobacco: Never Used     Comment: recenty 04/26/2014  . Alcohol use No  . Drug use: Yes    Types: Methylphenidate  . Sexual activity: No   Other Topics Concern  . Not on file   Social History Narrative  . No narrative on file    Hospitiliaztions: April, November x 2, 2017  Health Maintenance:    Flu: Completed in August 2017  Tetanus: Completed over 10 years ago.  Pneumovax: August 2016  Prevnar: August 2015  Colonoscopy: Never completed.   Eye Doctor: Annually  Dental Exam: Annually  Mammogram: Completed in 2015, due.   Pap: Due, will complete at next visit.  Hep C Screening: Negative 12/2016    Providers: [name], PCP; Dr. Casimiro Needle, Psychiatry; Dr. Lorelei Pont, Sports Medicine; Dr. Erlene Quan, Urology; Dr. Michela Pitcher, Neurologist   I have personally reviewed and have noted: 1. The patient's medical and social history 2. Their use of alcohol, tobacco or illicit drugs 3. Their current medications and supplements 4. The patient's functional ability including ADL's, fall risks, home safety risks  and hearing or visual impairment. 5. Diet and physical activities 6. Evidence for depression or mood disorder  Subjective:   Review of Systems:   Constitutional: Denies fever, malaise, fatigue, headache or abrupt weight changes.  HEENT: Denies eye pain, eye redness, ear pain, ringing in the ears, wax buildup, runny nose, nasal congestion, bloody nose, or sore throat. Respiratory: Denies difficulty breathing, shortness of breath, cough or sputum production.    Cardiovascular: Denies chest pain, chest tightness, palpitations or swelling in the hands or feet.  Gastrointestinal: Denies abdominal pain, bloating, constipation, diarrhea or blood in the stool.  GU: Denies urgency, frequency, pain with urination, burning sensation, blood in urine, odor or discharge. Did have small renal stone 1 week ago, symptoms have resulted. Does still experience flank pain from prior surgery. Musculoskeletal: Chronic osteoarthritis, decrease in range of motion to hips, back. Occasional difficulty with gait.  Skin: Denies redness, rashes, lesions or ulcercations.  Neurological: Denies dizziness, difficulty with memory. She does experience some difficulty with speech given MS. Some difficulty with balance. Psychiatric: Denies concerns for anxiety or depression. Feels well managed per psychiatry.  No other specific complaints in a complete review of systems (except as listed in HPI above).  Objective:  PE:   BP 124/84   Pulse 88   Temp 98.7 F (37.1 C) (Oral)   Ht 5\' 9"  (1.753 m)   Wt 207 lb 1.9 oz (93.9 kg)   LMP 08/23/2014   SpO2 98%   BMI 30.59 kg/m  Wt Readings from Last 3 Encounters:  12/25/16 207 lb 1.9 oz (93.9 kg)  12/16/16 208 lb 8 oz (94.6 kg)  11/04/16 206 lb (93.4 kg)    General: Appears their stated age, well developed, well nourished in NAD. Skin: Warm, dry and intact. No rashes, lesions or ulcerations noted. HEENT: Head: normal shape and size; Eyes: sclera white, no icterus, conjunctiva pink, PERRLA and EOMs intact; Ears: Tm's gray and intact, normal light reflex; Nose: mucosa pink and moist, septum midline; Throat/Mouth: Teeth present, mucosa pink and moist, no exudate, lesions or ulcerations noted.  Neck: Normal range of motion. Neck supple, trachea midline. No massses, lumps or thyromegaly present.  Cardiovascular: Normal rate and rhythm. S1,S2 noted.  No murmur, rubs or gallops noted. No JVD or BLE edema. No carotid bruits  noted. Pulmonary/Chest: Normal effort and positive vesicular breath sounds. No  respiratory distress. No wheezes, rales or ronchi noted.  Abdomen: Soft. Normal bowel sounds, no bruits noted. No distention or masses noted. Liver, spleen and kidneys non palpable. Musculoskeletal: Decreased range of motion to hips and lower back. No signs of joint swelling. No difficulty with gait today.  Neurological: Alert and oriented. Cranial nerves II-XII intact. Coordination normal. +DTRs bilaterally. Psychiatric: Mood and affect normal. Behavior is normal. Judgment and thought content normal.     BMET    Component Value Date/Time   NA 139 12/17/2016 0831   K 4.4 12/17/2016 0831   CL 104 12/17/2016 0831   CO2 29 12/17/2016 0831   GLUCOSE 111 (H) 12/17/2016 0831   BUN 16 12/17/2016 0831   CREATININE 0.85 12/17/2016 0831   CALCIUM 9.3 12/17/2016 0831   GFRNONAA >60 10/13/2016 0446   GFRAA >60 10/13/2016 0446    Lipid Panel     Component Value Date/Time   CHOL 341 (H) 12/17/2016 0831   TRIG 302.0 (H) 12/17/2016 0831   HDL 48.70 12/17/2016 0831   CHOLHDL 7 12/17/2016 0831   VLDL 60.4 (H) 12/17/2016 0831   LDLCALC 223 (H) 11/13/2014 0908    CBC    Component Value Date/Time   WBC 7.2 10/14/2016 0444   RBC 3.13 (L) 10/14/2016 0444   HGB 9.9 (L) 10/14/2016 0444   HCT 29.2 (L) 10/14/2016 0444   PLT 275 10/14/2016 0444   MCV 93.5 10/14/2016 0444   MCH 31.7 10/14/2016 0444   MCHC 33.9 10/14/2016 0444   RDW 13.2 10/14/2016 0444   LYMPHSABS 1.0 10/10/2016 1154   MONOABS 0.6 10/10/2016 1154   EOSABS 0.1 10/10/2016 1154   BASOSABS 0.0 10/10/2016 1154    Hgb A1C Lab Results  Component Value Date   HGBA1C 6.0 12/17/2016      Assessment and Plan:   Medicare Annual Wellness Visit:  Diet: She is working to improve her diet. Fast food 1-2 times weekly. Breakfast: Toast, coffee Lunch: Skips, Crackers, soup Dinner: Skips, snacks, soup Snacks: Yogurt, nut mix Desserts: Daily through  sweet breads, nut mix Beverages: Coffee, almond milk, water (36 ounces) Physical activity: Active at home, overall does not exercise. Depression/mood screen: Negative Hearing: Intact to whispered voice Visual acuity: Grossly normal, performs annual eye exam  ADLs: Capable Fall risk: Moderate, stable today. Home safety: Good Cognitive evaluation: Intact to orientation, naming, recall and repetition EOL planning: Adv directives packet provided today, full code/ I agree  Preventative Medicine: Tetanus due, does not believe insurance will cover. All other immunizations UTD. Pap due, will complete at next visit in 3 months. Mammogram due, ordered. Discussed the importance of a healthy diet and regular exercise in order for weight loss, and to reduce the risk of other medical diseases. Exam today stable. Labs with hyperlipidemia for which she agrees to treatment after years of refusal. Advanced directives packet provided. All recommendations provided to patient at end of visit.   Next appointment: 3 months.

## 2016-12-25 NOTE — Assessment & Plan Note (Signed)
Following with Urology. Overall stable.

## 2016-12-25 NOTE — Progress Notes (Signed)
Longstanding problem, please do not alter my coding in this case.

## 2016-12-25 NOTE — Assessment & Plan Note (Signed)
Agrees to treatment for lipids. Previously had refused. Rx for Zetia sent into pharmacy.  Repeat lipids in 3 months at follow up visit.

## 2016-12-25 NOTE — Telephone Encounter (Signed)
Spoken and notified patient of [name]'s comments. Patient verbalized understanding. 

## 2016-12-25 NOTE — Assessment & Plan Note (Addendum)
Long history of Norco use for osteoarthritis. Also with history of MS. She does well at the same dose, discussed need for medication and agree that she will need to continue. Discussed the need for ongoing use with supervising physician. Reviewed [place] controlled substance registry and patient compliant. No unusual activity. UDS and controlled substance contract UTD. Three months supply refilled today. Continues to follow with sports medicine.

## 2016-12-28 MED ORDER — CYANOCOBALAMIN 1000 MCG/ML IJ SOLN: 1000.0000 ug | Freq: Once | INTRAMUSCULAR | Status: DC

## 2016-12-28 NOTE — Addendum Note (Signed)
Addended by: Jacqualin Combes on: 12/28/2016 11:33 AM   Modules accepted: Orders

## 2016-12-28 NOTE — Progress Notes (Signed)
Reviewed noted given controlled substance use.  Agree with care and plan.  Eliezer Lofts, MD [place] [facility]

## 2017-01-05 ENCOUNTER — Other Ambulatory Visit (HOSPITAL_COMMUNITY): Payer: Self-pay

## 2017-01-05 ENCOUNTER — Other Ambulatory Visit: Payer: Self-pay | Admitting: Primary Care

## 2017-01-05 DIAGNOSIS — N200 Calculus of kidney: Secondary | ICD-10-CM

## 2017-01-05 MED ORDER — ZOLPIDEM TARTRATE 10 MG PO TABS: 10.0000 mg | ORAL_TABLET | Freq: Every day | ORAL | 1 refills | Status: DC

## 2017-01-07 ENCOUNTER — Encounter: Payer: Self-pay | Admitting: Primary Care

## 2017-01-09 ENCOUNTER — Other Ambulatory Visit: Payer: Self-pay | Admitting: Family Medicine

## 2017-01-09 NOTE — Telephone Encounter (Deleted)
Last office visit 12/25/16.  Last refilled

## 2017-01-12 ENCOUNTER — Encounter: Payer: Self-pay | Admitting: Primary Care

## 2017-01-12 ENCOUNTER — Ambulatory Visit: Payer: PPO | Attending: Primary Care | Admitting: Speech Pathology

## 2017-01-12 DIAGNOSIS — R41841 Cognitive communication deficit: Secondary | ICD-10-CM | POA: Insufficient documentation

## 2017-01-12 NOTE — Therapy (Signed)
[facility] [address] [place], [place], [address] Phone: [phone]   Fax:  [phone]  Speech Language Pathology Evaluation  Patient Details  Name: Emily Moon MRN: [id number] Date of Birth: [birth year] Referring Provider: [name], NP, Dr. Chipper Oman [facility])  Encounter Date: 01/12/2017      End of Session - 01/12/17 1419    Visit Number 1   Number of Visits 17   Date for SLP Re-Evaluation 03/09/17   SLP Start Time 1232   SLP Stop Time  1317   SLP Time Calculation (min) 45 min   Activity Tolerance Patient tolerated treatment well      Past Medical History:  Diagnosis Date  . Arthritis    osteo  . Asthma   . Bipolar disorder ([place]) 05/21/14  . Cataracts, bilateral   . DDD (degenerative disc disease), cervical    also back  . Depression   . Dizziness    Positional  . Edema    feet/legs  . Fibromyalgia syndrome   . Fungal infection    Finger nails  . GERD (gastroesophageal reflux disease)   . Gout   . Headache    seasonal allergies  . Heart palpitations   . 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 Hypercholesterolemia   . Multiple sclerosis (HCC)    weakness  . Nephrolithiasis    kidney stones  . Osteoporosis    osteoarthritis  . Pneumonia   . PONV (postoperative nausea and vomiting)    no problem after cataract surgery  . Psoriasis   . Renal stone   . Shortness of breath dyspnea    wheezing  . Sleep apnea 2012   sleep study / slight, no interventions  . Urinary frequency     Past Surgical History:  Procedure Laterality Date  . CATARACT EXTRACTION W/PHACO Left 05/21/2015   Procedure: CATARACT EXTRACTION PHACO AND INTRAOCULAR LENS PLACEMENT (IOC);  Surgeon: Birder Robson, MD;  Location: [facility];  Service: Ophthalmology;  Laterality: Left;  [place] 00:35 AP% 22.9 CDE 8.11 fluid pack lot [id number]  . CATARACT EXTRACTION W/PHACO Right 06/04/2015   Procedure: CATARACT  EXTRACTION PHACO AND INTRAOCULAR LENS PLACEMENT (IOC);  Surgeon: Birder Robson, MD;  Location: [facility];  Service: Ophthalmology;  Laterality: Right;  US:00:48 AP%: 10.5 CDE:5.08 Fluid lot [id number]  . CYSTOSCOPY/URETEROSCOPY/HOLMIUM LASER/STENT PLACEMENT Bilateral 09/22/2016   Procedure: CYSTOSCOPY/URETEROSCOPY/HOLMIUM LASER/STENT PLACEMENT;  Surgeon: Hollice Espy, MD;  Location: [facility];  Service: Urology;  Laterality: Bilateral;  . EYE SURGERY  2015   tissue biopsy  . FOOT SURGERY  2015  . JOINT REPLACEMENT Left 2013   hip replacement  . LITHOTRIPSY    . PTOSIS REPAIR Bilateral 02/18/2016   Procedure: BILATERAL PTOSIS REPAIR UPPER EYELIDS;  Surgeon: Karle Starch, MD;  Location: [place];  Service: Ophthalmology;  Laterality: Bilateral;  LEAVE PT EARLY AM  . thumb surgery Right   . TONSILLECTOMY  1973    There were no vitals filed for this visit.      Subjective Assessment - 01/12/17 1243    Subjective "I don't know why I'm here, it's my cognition not my speech"            SLP Evaluation [facility] - 01/12/17 1219      SLP Visit Information   SLP Received On 01/12/17   Referring Provider [name], NP, Dr. George Hugh Nuhad [facility])   Onset Date 25 years ago - worsening speech past 1-2 years  Medical Diagnosis Multiple Sclerosis     Pain Assessment   Currently in Pain? Yes   Pain Score 6    Pain Location Back   Pain Orientation Upper;Lower   Pain Type Chronic pain   Pain Onset More than a month ago   Pain Frequency Constant   Pain Relieving Factors rest, meds     General Information   HPI Her neurologic history dates back to 78 when she developed some numbness and itching in her leg, she was evaluated by a neurologist and underwent MRIs that showed a spinal cord lesion suggestive of myelitis and other brain lesions consistent with demyelinating lesions. She was subsequently diagnosed with multiple sclerosis. She had minor relapses in the first few  years, and then subsequently did well and was relatively stable over the years until the last 1-2 years when she has been having gradual worsening of cognition and balance.She reports that over the past 1-2 years, she has had more difficulties with cognitive function, she is more forgetful and has difficulty following a set of complex instructions. She also has noticed that her balance has been gradually getting worse, she currently uses a cane as needed for assistance. She also has neurogenic bladder symptoms with frequency, urgency, and urge incontinence. She also has some difficulty with emptying and gets occasional urinary tract infections. In the past she was on Flomax and another medicine per urology and had some improvement she has not been taking them for years. She reports that heat, stress and fatigue makes her symptoms worse. She has some neuropathic pain, she has been on gabapentin in the past and did not tolerate it due to side effects. She is currently on Cymbalta. She occasionally gets MS hug around her waist area. She has had a hip replacement on the left side and needs a right hip replacement. She is currently on disability. .   Mobility Status Walks independently, near fall last week      Prior Functional Status   Cognitive/Linguistic Baseline Baseline deficits   Type of Home House    Lives With Family  sister   Available Support Family   Vocation On disability     Cognition   Overall Cognitive Status Impaired/Different from baseline   Area of Impairment Awareness;Attention;Memory;Problem solving   Current Attention Level Selective   Memory Decreased short-term memory   Problem Solving Slow processing     Motor Speech   Overall Motor Speech Impaired   Respiration Within functional limits   Phonation Normal   Articulation Impaired   Level of Impairment Conversation   Intelligibility Intelligibility reduced   Conversation 75-100% accurate   Effective Techniques Slow  rate;Over-articulate;Pause     Standardized Assessments   Standardized Assessments  Montreal Cognitive Assessment [facility])  22/30                         SLP Education - 01/12/17 1419    Education provided Yes   Education Details goals for ST, compensations for cognitive impairments   Person(s) Educated Patient   Methods Explanation;Demonstration;Verbal cues   Comprehension Verbalized understanding;Returned demonstration          SLP Short Term Goals - 01/12/17 1434      SLP SHORT TERM GOAL #1   Title Pt will attend to details in mildly complex cognitive linguistic activity with 95% accuracy and rare min A   Time 4   Period Weeks   Status New  SLP SHORT TERM GOAL #2   Title Pt will alternate attention betwen 2 mildly complex cognitive linguistic activities with 90% on each and occasional min A   Time 4   Period Weeks   Status New     SLP SHORT TERM GOAL #3   Title Pt will demonstrate carryover of daily journal or notes in phone to recall pertinent daily events over 3 sessions   Time 4   Period Weeks   Status New     SLP SHORT TERM GOAL #4   Title Pt will demonstrate compensations for dysnomia in simple conversation with rare min A          SLP Long Term Goals - 01/12/17 1436      SLP LONG TERM GOAL #1   Title Pt will carryover journaling/phone notes to recall pertinent daily information over 5 sessions with rare min A   Time 8   Period Weeks   Status New     SLP LONG TERM GOAL #2   Title Pt will divide attention between 2 mildly complex cognitive linguistc tasks with 85% on each and occasional min A   Time 8   Period Weeks   Status New     SLP LONG TERM GOAL #3   Title Pt will verbalize compensations for attention with rare min A   Time 8   Period Weeks   Status New          Plan - 01/12/17 1420    Clinical Impression Statement Ms. Zola presents today with mild dysarthria and moderate higher leve cognitive linguistic  impairments and reduced short term memory. She noted her cognitive difficulties began about 2000, but have significantly worsened over the past year or two. She reports depression, with reduced motivation. Mrs. Borneman scored a 22/30 on the [employer], with 26/30 being [facility]. She demonstrated difficulties with selective attention, attention to detail and error awareness on serical 7 subtractions, tapping to letter, and sentence repetition. Ms. Wellhausen recalled 5/5 words immediately and 1/5 with a delay. Informal simple math problems reveal simple math is intact. On informal check writing task, pt did not attend to details in the direction Mrs. Belpre sister helps her manage medications. She is managing her appointments with timers and uses her phone calendar for daily to do lists. Mrs. Bignell does report difficulty organizing finances and remembering to pay  bills. Although Ms. Leising performed well on naming tasks on formal assessment, during conversation several word finding diffiuclties were observed and she complains of word finding difficulties regulary. She naturally over articulates to reduce slurred speech and is aware of need to do this to be understood. I recommend skilled ST for training in compensations for attention, recall and higher level word finding.   Ms. Dooney reports difficulty with handwriting, typing and balance - Please order OT/PT eval and treat if in agreement   Speech Therapy Frequency 2x / week   Duration --  8 weeks or total of 17 visits   Potential to Achieve Goals Fair   Potential Considerations Medical prognosis;Severity of impairments;Ability to learn/carryover information      Patient will benefit from skilled therapeutic intervention in order to improve the following deficits and impairments:   Cognitive communication deficit    Problem List Patient Active Problem List   Diagnosis Date Noted  . B12 deficiency 05/22/2016  . Dizziness  03/16/2016  . Medicare annual wellness visit, subsequent 10/25/2015  . Back pain 10/31/2014  . Bipolar disorder (  [place]) 05/21/2014  . 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 Insomnia 09/01/2011  . Obesity 05/04/2011  . HLD (hyperlipidemia) 01/20/2011  . SMOKER 01/20/2011  . Chronic pain syndrome 01/07/2011  . RENAL CALCULUS, RECURRENT 11/13/2010  . MULTIPLE SCLEROSIS, PROGRESSIVE/RELAPSING 08/28/2010  . GERD 08/28/2010  . Osteoarthritis 08/28/2010  . NEPHROLITHIASIS, HX OF 08/28/2010    Lallie Strahm, Annye Rusk  MS, CCC-SLP 01/12/2017, 2:40 PM  [facility] [address] [place], [place], [address] Phone: [phone]   Fax:  [phone]  Name: JAIDON WOOLCOTT MRN: [id number] Date of Birth: 1961/01/21

## 2017-01-24 ENCOUNTER — Other Ambulatory Visit: Payer: Self-pay | Admitting: Primary Care

## 2017-01-24 DIAGNOSIS — G35 Multiple sclerosis: Secondary | ICD-10-CM

## 2017-01-25 ENCOUNTER — Telehealth: Payer: Self-pay | Admitting: Primary Care

## 2017-01-25 NOTE — Telephone Encounter (Signed)
Noted  

## 2017-01-25 NOTE — Telephone Encounter (Signed)
Anda Kraft I spoke with [facility] they need 2 referral put in one for PT and one for OT with DX before they can  Schedule Thanks

## 2017-01-25 NOTE — Telephone Encounter (Signed)
Disregard  They already schedule appointment  02/04/17

## 2017-01-26 ENCOUNTER — Encounter: Payer: Self-pay | Admitting: Primary Care

## 2017-01-27 ENCOUNTER — Other Ambulatory Visit: Payer: Self-pay | Admitting: Primary Care

## 2017-01-27 DIAGNOSIS — E785 Hyperlipidemia, unspecified: Secondary | ICD-10-CM

## 2017-01-27 MED ORDER — ROSUVASTATIN CALCIUM 5 MG PO TABS: ORAL_TABLET | ORAL | 1 refills | Status: DC

## 2017-01-30 ENCOUNTER — Other Ambulatory Visit: Payer: Self-pay | Admitting: Family Medicine

## 2017-01-31 NOTE — Telephone Encounter (Signed)
Last office visit 12/25/16.  Last refilled 12/16/16 for #180 with 1 refill by Dr. Lorelei Pont.  Ok to refill?

## 2017-02-01 NOTE — Telephone Encounter (Signed)
Noted, will send for original Rx as her requested dose is too high.

## 2017-02-02 ENCOUNTER — Telehealth: Payer: Self-pay

## 2017-02-02 DIAGNOSIS — M1990 Unspecified osteoarthritis, unspecified site: Secondary | ICD-10-CM

## 2017-02-02 MED ORDER — CYCLOBENZAPRINE HCL 10 MG PO TABS: 10.0000 mg | ORAL_TABLET | Freq: Three times a day (TID) | ORAL | 0 refills | Status: DC | PRN

## 2017-02-02 MED ORDER — METHOCARBAMOL 500 MG PO TABS: 500.0000 mg | ORAL_TABLET | Freq: Three times a day (TID) | ORAL | 0 refills | Status: DC | PRN

## 2017-02-02 NOTE — Telephone Encounter (Signed)
Called patient and discussed concerns. She does not want flexeril or tizanidine, will send methocarbamol to her pharmacy. Please call her pharmacy and cancel the Flexeril and notify them that we are moving forward with methocarbamol (Robaxin).

## 2017-02-02 NOTE — Telephone Encounter (Signed)
Patient called back and mention the information below. She stated that she was wonder which muscle relaxer is stronger. She is concern about the dosage. Patient did wanted to speak to [place]. I did asked her to send a message through [facility].

## 2017-02-02 NOTE — Telephone Encounter (Signed)
Emily Moon with [facility] [place] left v/m;pt thinks Flexeril should be 2 tabs q8h but med list has one tab po tid prn. Emily Moon request cb with clarification.

## 2017-02-02 NOTE — Telephone Encounter (Signed)
Okay to change to Flexeril, please cancel prescription for Tizanidine at pharmacy. Please have patient update [place] if this is better and I will send additional refills.

## 2017-02-02 NOTE — Telephone Encounter (Signed)
Pt left v/m; pt takes tizanidine for back and neck pain and it helps with bladder problems. Pt thinks tizanidine is not working as well as it used to and pt wants to know if could change med to flexeril taking 2 q 6 - 8 hrs prn. [facility] [place].

## 2017-02-02 NOTE — Telephone Encounter (Signed)
Per DPR, left detail message for patient of [name]'s comments. 

## 2017-02-03 ENCOUNTER — Other Ambulatory Visit: Payer: Self-pay | Admitting: Primary Care

## 2017-02-03 ENCOUNTER — Encounter: Payer: Self-pay | Admitting: Primary Care

## 2017-02-03 ENCOUNTER — Telehealth: Payer: Self-pay

## 2017-02-03 DIAGNOSIS — G35 Multiple sclerosis: Secondary | ICD-10-CM

## 2017-02-03 NOTE — Telephone Encounter (Signed)
Spoken to [facility] and cancelled the flexeril.

## 2017-02-03 NOTE — Telephone Encounter (Signed)
Orders placed.

## 2017-02-03 NOTE — Telephone Encounter (Signed)
Emily Moon with [facility] left v/m; received the OT and PT additional orders but that was added to speech therapy orders. Emily Moon said will need the OT and PT ordered in Epic separately; pt to be seen 02/04/17.

## 2017-02-04 ENCOUNTER — Encounter: Payer: Self-pay | Admitting: Physical Therapy

## 2017-02-04 ENCOUNTER — Ambulatory Visit: Payer: PPO | Attending: Primary Care | Admitting: Occupational Therapy

## 2017-02-04 ENCOUNTER — Ambulatory Visit: Payer: PPO | Admitting: Physical Therapy

## 2017-02-04 DIAGNOSIS — M25552 Pain in left hip: Secondary | ICD-10-CM

## 2017-02-04 DIAGNOSIS — M25551 Pain in right hip: Secondary | ICD-10-CM | POA: Insufficient documentation

## 2017-02-04 DIAGNOSIS — M545 Low back pain, unspecified: Secondary | ICD-10-CM

## 2017-02-04 DIAGNOSIS — R278 Other lack of coordination: Secondary | ICD-10-CM | POA: Insufficient documentation

## 2017-02-04 DIAGNOSIS — R41844 Frontal lobe and executive function deficit: Secondary | ICD-10-CM | POA: Diagnosis not present

## 2017-02-04 DIAGNOSIS — R2681 Unsteadiness on feet: Secondary | ICD-10-CM | POA: Diagnosis not present

## 2017-02-04 DIAGNOSIS — R279 Unspecified lack of coordination: Secondary | ICD-10-CM | POA: Insufficient documentation

## 2017-02-04 DIAGNOSIS — G8929 Other chronic pain: Secondary | ICD-10-CM | POA: Diagnosis not present

## 2017-02-04 DIAGNOSIS — R2689 Other abnormalities of gait and mobility: Secondary | ICD-10-CM

## 2017-02-04 DIAGNOSIS — R42 Dizziness and giddiness: Secondary | ICD-10-CM

## 2017-02-04 DIAGNOSIS — M6281 Muscle weakness (generalized): Secondary | ICD-10-CM

## 2017-02-05 NOTE — Therapy (Signed)
[facility] [address] [place], [place], [address] Phone: [phone]   Fax:  [phone]  Occupational Therapy Evaluation  Patient Details  Name: Emily Moon MRN: [id number] Date of Birth: 22-May-1961 Referring Provider: [name] NP  Encounter Date: 02/04/2017      OT End of Session - 02/05/17 1716    Visit Number 1   Number of Visits 17   Date for OT Re-Evaluation 04/04/17   Authorization Type [employer]   Authorization Time Period 60 days   Authorization - Visit Number 1   Authorization - Number of Visits 10   OT Start Time 1105   OT Stop Time 1145   OT Time Calculation (min) 40 min   Activity Tolerance Patient tolerated treatment well   Behavior During Therapy [facility] for tasks assessed/performed      Past Medical History:  Diagnosis Date  . Arthritis    osteo  . Asthma   . Bipolar disorder ([place]) 05/21/14  . Cataracts, bilateral   . DDD (degenerative disc disease), cervical    also back  . Depression   . Dizziness    Positional  . Edema    feet/legs  . Fibromyalgia syndrome   . Fungal infection    Finger nails  . GERD (gastroesophageal reflux disease)   . Gout   . Headache    seasonal allergies  . Heart palpitations   . 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 Hypercholesterolemia   . Multiple sclerosis (HCC)    weakness  . Nephrolithiasis    kidney stones  . Osteoporosis    osteoarthritis  . Pneumonia   . PONV (postoperative nausea and vomiting)    no problem after cataract surgery  . Psoriasis   . Renal stone   . Shortness of breath dyspnea    wheezing  . Sleep apnea 2012   sleep study / slight, no interventions  . Urinary frequency     Past Surgical History:  Procedure Laterality Date  . CATARACT EXTRACTION W/PHACO Left 05/21/2015   Procedure: CATARACT EXTRACTION PHACO AND INTRAOCULAR LENS PLACEMENT (IOC);  Surgeon: Birder Robson, MD;  Location:  [facility];  Service: Ophthalmology;  Laterality: Left;  [place] 00:35 AP% 22.9 CDE 8.11 fluid pack lot #[id number]  . CATARACT EXTRACTION W/PHACO Right 06/04/2015   Procedure: CATARACT EXTRACTION PHACO AND INTRAOCULAR LENS PLACEMENT (IOC);  Surgeon: Birder Robson, MD;  Location: [facility];  Service: Ophthalmology;  Laterality: Right;  US:00:48 AP%: 10.5 CDE:5.08 Fluid lot #[id number] H  . CYSTOSCOPY/URETEROSCOPY/HOLMIUM LASER/STENT PLACEMENT Bilateral 09/22/2016   Procedure: CYSTOSCOPY/URETEROSCOPY/HOLMIUM LASER/STENT PLACEMENT;  Surgeon: Hollice Espy, MD;  Location: [facility];  Service: Urology;  Laterality: Bilateral;  . EYE SURGERY  2015   tissue biopsy  . FOOT SURGERY  2015  . JOINT REPLACEMENT Left 2013   hip replacement  . LITHOTRIPSY    . PTOSIS REPAIR Bilateral 02/18/2016   Procedure: BILATERAL PTOSIS REPAIR UPPER EYELIDS;  Surgeon: Karle Starch, MD;  Location: [place];  Service: Ophthalmology;  Laterality: Bilateral;  LEAVE PT EARLY AM  . thumb surgery Right   . TONSILLECTOMY  1973    There were no vitals filed for this visit.      Subjective Assessment - 02/04/17 1638    Subjective  Pt with MS presents with weakness and pain which inhibits ADL performance   Pertinent History MS,bi-polar disorder, history of L THA  2013, OA needs Right THA, gout, DDD, chronic pain/fibromyalgia,  lower leg nuumbness,  positional dizziness, osteoporosis, kidney stones   Patient Stated Goals maintain her independence   Currently in Pain? Yes   Pain Score 6    Pain Location Generalized   Pain Descriptors / Indicators Aching   Pain Type Chronic pain   Pain Onset More than a month ago   Pain Frequency Intermittent   Aggravating Factors  moving wrong   Pain Relieving Factors rest, meds           [facility] OT Assessment - 02/05/17 0001      Assessment   Diagnosis MS   Referring Provider [name] NP   Onset Date 02/03/17     Precautions   Precautions Fall;Other (comment)      Balance Screen   Has the patient fallen in the past 6 months Yes   How many times? 1   Has the patient had a decrease in activity level because of a fear of falling?  Yes   Is the patient reluctant to leave their home because of a fear of falling?  Yes     Home  Environment   Family/patient expects to be discharged to: Private residence   Living Arrangements Other relatives   Available Help at Discharge Family   Type of [place] Two level   Alternate Level Stairs - Number of Steps --  stays on first floor   Lives With Family     Prior Function   Level of Independence Independent;Independent with basic ADLs   Vocation On disability  Probation officer     ADL   Eating/Feeding Modified independent   Grooming Independent   Upper Body Bathing Modified independent   Lower Body Bathing Modified independent  standing in shower, has bench but does not use   Upper Body Dressing Increased time   Toilet Tranfer Independent   Tub/Shower Transfer Modified independent  has grab bar   ADL comments difficulty with writing, occaisional  intention tremor with right hand, difficulty dropping items  Pt reports difficulty threading a needle.     IADL   Light Housekeeping Maintains house alone or with occasional assistance   Meal Prep Able to complete simple warm meal prep   Medication Management Is responsible for taking medication in correct dosages at correct time   Financial Management Manages financial matters independently (budgets, writes checks, pays rent, bills goes to bank), collects and keeps track of income     Mobility   Mobility Status Independent;History of falls     Written Expression   Dominant Hand Right   Handwriting Mild micrographia  good legibility     Vision Assessment   Vision Assessment Vision not tested     Activity Tolerance   Activity Tolerance --  grossly 20 mins of activities in standing     Cognition   Overall Cognitive Status Impaired/Different  from baseline   Area of Impairment Awareness;Attention;Memory;Problem solving   Current Attention Level Selective   Memory Decreased short-term memory   Attention Selective   Memory Impaired   Memory Impairment Decreased short term memory     Observation/Other Assessments   Standing Functional Reach Test RUE 7 inches, LUE 9 inches     Sensation   Light Touch Impaired by gross assessment  bilateral UE's     Coordination   Fine Motor Movements are Fluid and Coordinated No   9 Hole Peg Test Right;Left   Right 9 Hole Peg Test  28.50 secs   Left 9 Hole Peg Test 28.91  AROM   Overall AROM Comments mild limitation in right shoulder  flexion 125, left shoulder flexion 140     Strength   Overall Strength Deficits  grossly 3+/5 for bilateral UE's     Hand Function   Right Hand Grip (lbs) 40 lbs   Left Hand Grip (lbs) 40 lbs                            OT Short Term Goals - 02/05/17 1729      OT SHORT TERM GOAL #1   Title I with HEP. due 03/06/17   Time 4   Period Weeks   Status New     OT SHORT TERM GOAL #2   Title Pt will verbalize understanding of adapted strategies(including AE/DME)/ energy conservation techniques to maximize safety and independence wiuth ADLS/IADLs.   Time 4   Period Weeks   Status New     OT SHORT TERM GOAL #3   Title Pt will demonstrate ability to write a short paragraph with 100% legibility and no significant decrease in letter size.   Time 4   Period Weeks   Status New           OT Long Term Goals - 02/05/17 1732      OT LONG TERM GOAL #1   Title Pt will demonstrate improved standing balance for ADLS as evidenced by improving bilateral standing functional reach to 10 inches or greater. due 03/05/17   Baseline RUE 7 inches, LUE 9 inches   Time 8   Period Weeks   Status New     OT LONG TERM GOAL #2   Title Pt will demonstrate ability to retrieve a lightweight object at 130 shoulder flexion with RUE.   Time 8    Period Weeks   Status New     OT LONG TERM GOAL #3   Title Pt will verbalize understanding of ways to compensate for short term memory deficits.   Time 8   Period Weeks   Status New               Plan - 02/04/17 1650    Clinical Impression Statement Pt diagnosed with multiple sclerosis, diagnosed approx.  25 years ago presents with worsening strength, balance  and coordination which impedes her performance of daily activities. PMH includes the following: MS,bi-polar disorder, history of L THA  2013, OA needs Right THA, gout, DDD, chronic pain/fibromyalgia,  lower leg nuumbness, positional dizziness, osteoporosis, kidney stones   Pt can benefit from skilled occupational therapy to maximize safety and independence with ADLs/IADLS and to maintain quality of life..   Rehab Potential Fair   OT Frequency 2x / week   OT Duration 8 weeks   OT Treatment/Interventions Self-care/ADL training;DME and/or AE instruction;Patient/family education;Balance training;Therapeutic exercises;Ultrasound;Therapeutic exercise;Therapeutic activities;Cognitive remediation/compensation;Passive range of motion;Functional Mobility Training;Neuromuscular education;Cryotherapy;Electrical Stimulation;Parrafin;Energy conservation;Manual Therapy;Visual/perceptual remediation/compensation   Plan initiate HEP, energy conservation   Consulted and Agree with Plan of Care Patient      Patient will benefit from skilled therapeutic intervention in order to improve the following deficits and impairments:  Abnormal gait, Decreased cognition, Impaired flexibility, Pain, Impaired sensation, Decreased mobility, Decreased coordination, Decreased activity tolerance, Decreased endurance, Decreased range of motion, Decreased strength, Impaired tone, Impaired UE functional use, Difficulty walking, Decreased safety awareness, Decreased knowledge of precautions, Decreased balance  Visit Diagnosis: Muscle weakness (generalized) - Plan:  Ot plan of care cert/re-cert  Other lack of coordination -  Plan: Ot plan of care cert/re-cert  Unsteadiness on feet - Plan: Ot plan of care cert/re-cert  Frontal lobe and executive function deficit - Plan: Ot plan of care cert/re-cert      G-Codes - 77/82/42 1726    Functional Assessment Tool Used (Outpatient only) standing functional reach:RUE 7 inch, LUE 9 inches, mild micrographia   Functional Limitation Self care   Self Care Current Status (P5361) At least 20 percent but less than 40 percent impaired, limited or restricted   Self Care Goal Status (W4315) At least 1 percent but less than 20 percent impaired, limited or restricted      Problem List Patient Active Problem List   Diagnosis Date Noted  . B12 deficiency 05/22/2016  . Dizziness 03/16/2016  . Medicare annual wellness visit, subsequent 10/25/2015  . Back pain 10/31/2014  . Bipolar disorder ([place]) 05/21/2014  . 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 Insomnia 09/01/2011  . Obesity 05/04/2011  . HLD (hyperlipidemia) 01/20/2011  . SMOKER 01/20/2011  . Chronic pain syndrome 01/07/2011  . RENAL CALCULUS, RECURRENT 11/13/2010  . MULTIPLE SCLEROSIS, PROGRESSIVE/RELAPSING 08/28/2010  . GERD 08/28/2010  . Osteoarthritis 08/28/2010  . NEPHROLITHIASIS, HX OF 08/28/2010    Clary Boulais 02/05/2017, 5:41 PM  [facility] [address] [place], [place], [address] Phone: [phone]   Fax:  [phone]  Name: Emily Moon MRN: [id number] Date of Birth: [birth year]

## 2017-02-05 NOTE — Therapy (Signed)
[facility] [address] [place], [place], [address] Phone: [phone]   Fax:  [phone]  Physical Therapy Evaluation  Patient Details  Name: Emily Moon MRN: [id number] Date of Birth: [birth year] Referring Provider: [name], NP (Dr. Chipper Oman at [facility])  Encounter Date: 02/04/2017      PT End of Session - 02/04/17 1257    Visit Number 1   Number of Visits 17   Date for PT Re-Evaluation 04/05/17   Authorization Type [employer] G-code   PT Start Time 1016   PT Stop Time 1100   PT Time Calculation (min) 44 min   Equipment Utilized During Treatment Gait belt   Activity Tolerance Patient tolerated treatment well;Patient limited by pain   Behavior During Therapy [facility] for tasks assessed/performed      Past Medical History:  Diagnosis Date  . Arthritis    osteo  . Asthma   . Bipolar disorder ([place]) 05/21/14  . Cataracts, bilateral   . DDD (degenerative disc disease), cervical    also back  . Depression   . Dizziness    Positional  . Edema    feet/legs  . Fibromyalgia syndrome   . Fungal infection    Finger nails  . GERD (gastroesophageal reflux disease)   . Gout   . Headache    seasonal allergies  . Heart palpitations   . 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 Hypercholesterolemia   . Multiple sclerosis (HCC)    weakness  . Nephrolithiasis    kidney stones  . Osteoporosis    osteoarthritis  . Pneumonia   . PONV (postoperative nausea and vomiting)    no problem after cataract surgery  . Psoriasis   . Renal stone   . Shortness of breath dyspnea    wheezing  . Sleep apnea 2012   sleep study / slight, no interventions  . Urinary frequency     Past Surgical History:  Procedure Laterality Date  . CATARACT EXTRACTION W/PHACO Left 05/21/2015   Procedure: CATARACT EXTRACTION PHACO AND INTRAOCULAR LENS PLACEMENT (IOC);  Surgeon: Birder Robson, MD;  Location:  [facility] ORS;  Service: Ophthalmology;  Laterality: Left;  [place] 00:35 AP% 22.9 CDE 8.11 fluid pack lot #[id number]  . CATARACT EXTRACTION W/PHACO Right 06/04/2015   Procedure: CATARACT EXTRACTION PHACO AND INTRAOCULAR LENS PLACEMENT (IOC);  Surgeon: Birder Robson, MD;  Location: [facility] ORS;  Service: Ophthalmology;  Laterality: Right;  US:00:48 AP%: 10.5 CDE:5.08 Fluid lot #[id number]  . CYSTOSCOPY/URETEROSCOPY/HOLMIUM LASER/STENT PLACEMENT Bilateral 09/22/2016   Procedure: CYSTOSCOPY/URETEROSCOPY/HOLMIUM LASER/STENT PLACEMENT;  Surgeon: Hollice Espy, MD;  Location: [facility] ORS;  Service: Urology;  Laterality: Bilateral;  . EYE SURGERY  2015   tissue biopsy  . FOOT SURGERY  2015  . JOINT REPLACEMENT Left 2013   hip replacement  . LITHOTRIPSY    . PTOSIS REPAIR Bilateral 02/18/2016   Procedure: BILATERAL PTOSIS REPAIR UPPER EYELIDS;  Surgeon: Karle Starch, MD;  Location: [facility];  Service: Ophthalmology;  Laterality: Bilateral;  LEAVE PT EARLY AM  . thumb surgery Right   . TONSILLECTOMY  1973    There were no vitals filed for this visit.       Subjective Assessment - 02/04/17 1023    Subjective This 56yo female has history of Multiple Sclerosis with symptoms onset and diagnosis ~1991. She has experienced excerbations to MS. She was noted to have increased gait abnormality with balance losses and was referred to PT. She is recieving OT &  speech evaluations / treatment also.     Pertinent History bi-polar disorder, history of L THA  2013, OA needs Right THA, gout, DDD, chronic pain/fibromyalgia, MS, lower leg nuumbness, positional dizziness, osteoporosis, kidney stones   Limitations Lifting;Standing;Walking;House hold activities   Patient Stated Goals improve walking and balance   Currently in Pain? Yes   Pain Score 6    Pain Location Back   Pain Orientation Lower;Upper   Pain Descriptors / Indicators Throbbing;Squeezing   Pain Type Chronic pain   Pain Onset More than a month ago    Pain Frequency Intermittent   Aggravating Factors  thinks gettting a kidney stone,  moving wrong causes LBP   Pain Relieving Factors rest, meds   Effect of Pain on Daily Activities limits standing & walking   Multiple Pain Sites Yes   Pain Score 7  7.5   Pain Location Neck  neck & shoulders   Pain Orientation Right;Left;Mid   Pain Descriptors / Indicators Sore   Pain Type Chronic pain   Pain Onset More than a month ago   Pain Frequency Constant   Aggravating Factors  sleeping wrong, does it itself unknown.   Pain Relieving Factors meds, aspercream   Pain Score 4   Pain Location Hip   Pain Orientation Right;Left   Pain Descriptors / Indicators Aching;Sore   Pain Type Chronic pain   Pain Onset More than a month ago   Pain Frequency Constant   Aggravating Factors  OA, stiffness or overdoing it   Pain Relieving Factors laying down            [facility] PT Assessment - 02/04/17 1015      Assessment   Medical Diagnosis MS   Referring Provider [name], NP  Dr. Chipper Oman at [facility]   Onset Date/Surgical Date 02/03/17  PT referral   Hand Dominance Right   Prior Therapy PT for hip     Precautions   Precautions Fall;Other (comment)  MS fatigue     Balance Screen   Has the patient fallen in the past 6 months Yes   How many times? 3-4 falls with multiple near falls   Has the patient had a decrease in activity level because of a fear of falling?  No   Is the patient reluctant to leave their home because of a fear of falling?  No     Home Environment   Living Environment Private residence   Living Arrangements Other relatives  sister, has issues also   Type of [place] to enter   Entrance Stairs-Number of Steps 2   Entrance Stairs-Rails None   Home Layout Two level;Able to live on main level with bedroom/bathroom   Alternate Level Stairs-Number of Steps 15   Alternate Level Stairs-Rails Right   Home Equipment Walker - 2 wheels;Cane - single  point;Bedside commode     Prior Function   Level of Independence Independent;Independent with gait     Posture/Postural Control   Posture/Postural Control Postural limitations   Postural Limitations Rounded Shoulders;Forward head;Flexed trunk  wide base     ROM / Strength   AROM / PROM / Strength AROM;Strength     AROM   Overall AROM Comments Impaired flexibility cervical, lumbar, shoulders, hips, hamstrings, heelcords     Strength   Overall Strength Deficits   Strength Assessment Site Hip;Knee;Ankle;Lumbar   Right/Left Hip Right;Left   Right Hip Flexion 4-/5   Right Hip Extension 3-/5  Right Hip ABduction 3-/5   Left Hip Flexion 3+/5   Left Hip Extension 3-/5   Left Hip ABduction 3-/5   Right/Left Knee Right;Left   Right Knee Flexion 3-/5   Right Knee Extension 3+/5   Left Knee Flexion 3-/5   Left Knee Extension 3+/5   Right/Left Ankle Right;Left   Right Ankle Dorsiflexion 4/5   Right Ankle Plantar Flexion 3-/5   Left Ankle Dorsiflexion 4/5   Left Ankle Plantar Flexion 3-/5   Lumbar Flexion 3/5   Lumbar Extension 3/5     Transfers   Transfers Sit to Stand;Stand to Sit   Sit to Stand 5: Supervision;With upper extremity assist;Without upper extremity assist;From chair/3-in-1  without UE requires wide base   Stand to Sit 6: Modified independent (Device/Increase time);With upper extremity assist;To chair/3-in-1     Ambulation/Gait   Ambulation/Gait Yes   Ambulation/Gait Assistance 5: Supervision   Ambulation Distance (Feet) 150 Feet   Assistive device None   Gait Pattern Step-through pattern;Decreased stride length;Lateral hip instability;Decreased trunk rotation;Trunk flexed;Wide base of support   Ambulation Surface Indoor;Level   Gait velocity 3.09 ft/sec   Stairs Yes   Stairs Assistance 5: Supervision;4: Min assist   Stairs Assistance Details (indicate cue type and reason) slow, cautious movements; assessed 2 steps without rails with minA   Stair Management  Technique Two rails;One rail Right;Alternating pattern;Forwards   Number of Stairs 4   Ramp 4: Min assist  no device   Ramp Details (indicate cue type and reason) --   Curb 4: Min assist  no device   Curb Details (indicate cue type and reason) caught toe with trip ascending     Standardized Balance Assessment   Standardized Balance Assessment Berg Balance Test;Timed Up and Go Test     Berg Balance Test   Sit to Stand Able to stand  independently using hands   Standing Unsupported Able to stand safely 2 minutes   Sitting with Back Unsupported but Feet Supported on Floor or Stool Able to sit safely and securely 2 minutes   Stand to Sit Controls descent by using hands   Transfers Able to transfer safely, minor use of hands   Standing Unsupported with Eyes Closed Unable to keep eyes closed 3 seconds but stays steady   Standing Ubsupported with Feet Together Able to place feet together independently but unable to hold for 30 seconds   From Standing, Reach Forward with Outstretched Arm Reaches forward but needs supervision   From Standing Position, Pick up Object from Floor Able to pick up shoe, needs supervision   From Standing Position, Turn to Look Behind Over each Shoulder Turn sideways only but maintains balance   Turn 360 Degrees Needs close supervision or verbal cueing   Standing Unsupported, Alternately Place Feet on Step/Stool Able to complete >2 steps/needs minimal assist   Standing Unsupported, One Foot in Front Able to take small step independently and hold 30 seconds   Standing on One Leg Tries to lift leg/unable to hold 3 seconds but remains standing independently   Total Score 32     Timed Up and Go Test   Normal TUG (seconds) 14.21  >13.5sec indicates fall risk   Cognitive TUG (seconds) 19.24  35.4% increase & >15.5sec indicate fall risk     Functional Gait  Assessment   Gait assessed  Yes   Gait Level Surface Walks 20 ft, slow speed, abnormal gait pattern, evidence for  imbalance or deviates 10-15 in outside of the  12 in walkway width. Requires more than 7 sec to ambulate 20 ft.   Change in Gait Speed Makes only minor adjustments to walking speed, or accomplishes a change in speed with significant gait deviations, deviates 10-15 in outside the 12 in walkway width, or changes speed but loses balance but is able to recover and continue walking.   Gait with Horizontal Head Turns Performs head turns with moderate changes in gait velocity, slows down, deviates 10-15 in outside 12 in walkway width but recovers, can continue to walk.   Gait with Vertical Head Turns Performs task with moderate change in gait velocity, slows down, deviates 10-15 in outside 12 in walkway width but recovers, can continue to walk.   Gait and Pivot Turn Turns slowly, requires verbal cueing, or requires several small steps to catch balance following turn and stop   Step Over Obstacle Is able to step over one shoe box (4.5 in total height) but must slow down and adjust steps to clear box safely. May require verbal cueing.   Gait with Narrow Base of Support Ambulates less than 4 steps heel to toe or cannot perform without assistance.   Gait with Eyes Closed Walks 20 ft, slow speed, abnormal gait pattern, evidence for imbalance, deviates 10-15 in outside 12 in walkway width. Requires more than 9 sec to ambulate 20 ft.   Ambulating Backwards Walks 20 ft, slow speed, abnormal gait pattern, evidence for imbalance, deviates 10-15 in outside 12 in walkway width.   Steps Alternating feet, must use rail.   Total Score 10            Vestibular Assessment - 02/04/17 1100      Symptom Behavior   Type of Dizziness Lightheadedness   Duration of Dizziness 5-10 seconds   Aggravating Factors Looking up to the ceiling;Sitting with head tilted back;Turning body quickly;Turning head quickly;Sit to stand;Forward bending   Relieving Factors Rest     Occulomotor Exam   Spontaneous Absent   Gaze-induced  Absent   Head shaking Horizontal Absent   Smooth Pursuits --  right eye delayed movements   Saccades Dysmetria;Poor trajectory;Slow                         PT Short Term Goals - 02/04/17 1308      PT SHORT TERM GOAL #1   Title Patient demonstrates & verbalizes understanding of initial HEP.  (Target Date: 03/12/2017)   Time 1   Period Months   Status New     PT SHORT TERM GOAL #2   Title Patient verbalizes energy conservation techniques for MS. (Target Date: 03/12/2017)   Time 1   Period Months   Status New     PT SHORT TERM GOAL #3   Title Patient ambulates with head turns to scan environment during gait & maintains path with supervision.  (Target Date: 03/12/2017)   Time 1   Period Months   Status New           PT Long Term Goals - 02/04/17 1310      PT LONG TERM GOAL #1   Title Patient demonstrates & verbalizes understanding of ongoing HEP / fitness plan including MS modifications.  (Target Date: 04/06/2017)   Time 2   Period Months   Status New     PT LONG TERM GOAL #2   Title Berg Balance >/= 45/56 to indicate lower fall risk.  (Target Date: 04/06/2017)   Time 2  Period Months   Status New     PT LONG TERM GOAL #3   Title Functional Gait Assessment >/= 16/30 to indicate lower fall risk.  (Target Date: 04/06/2017)   Time 2   Period Months   Status New     PT LONG TERM GOAL #4   Title Timed Up & Go <13.5 sec and cognitive TUG increases <25% to indicate lower fall risk.  (Target Date: 04/06/2017)   Time 2   Period Months   Status New               Plan - March 06, 2017 1259    Clinical Impression Statement This 56yo female with history of MS & OA with recent decline in mobility. Patient has weakness & impaired flexibility effecting her mobility. She has impaired balance with high fall risk as noted by Merrilee Jansky Balance 32.56, Timed Up-Go 14.21sec, cognitive TUG 19.24sec ( 35.4% increase & stops naming). Her gait has deviations and instability with  fall risk also noted by Functional Gait Assessment 10/30. Her condition is evolving & plan of care is moderate. Patient would benefit from skilled services to progress mobility & safety.                      Rehab Potential Good   Clinical Impairments Affecting Rehab Potential bi-polar disorder, history of L THA  2013, OA needs Right THA, gout, DDD, chronic pain/fibromyalgia, MS, lower leg nuumbness, positional dizziness, osteoporosis, kidney stones   PT Frequency 2x / week   PT Duration 8 weeks   PT Treatment/Interventions ADLs/Self Care Home Management;DME Instruction;Gait training;Stair training;Functional mobility training;Therapeutic activities;Therapeutic exercise;Balance training;Neuromuscular re-education;Patient/family education   PT Next Visit Plan HEP OTAGO,    Consulted and Agree with Plan of Care Patient      Patient will benefit from skilled therapeutic intervention in order to improve the following deficits and impairments:  Abnormal gait, Decreased activity tolerance, Decreased balance, Decreased cognition, Decreased coordination, Decreased endurance, Decreased knowledge of precautions, Decreased mobility, Decreased safety awareness, Decreased strength, Dizziness, Impaired flexibility, Postural dysfunction  Visit Diagnosis: Unsteadiness on feet  Dizziness and giddiness  Other abnormalities of gait and mobility  Chronic midline low back pain without sciatica  Muscle weakness (generalized)  Pain in left hip  Pain in right hip  Unspecified lack of coordination      G-Codes - 2017-03-06 1127    Functional Assessment Tool Used (Outpatient Only) Timed Up-Go 14.21sec, cognitive TUG 19.24sec, Berg Balance 32/56   Functional Limitation Mobility: Walking and moving around   Mobility: Walking and Moving Around Current Status [phone]) At least 60 percent but less than 80 percent impaired, limited or restricted   Mobility: Walking and Moving Around Goal Status [phone]) At least 20  percent but less than 40 percent impaired, limited or restricted       Problem List Patient Active Problem List   Diagnosis Date Noted  . B12 deficiency 05/22/2016  . Dizziness 03/16/2016  . Medicare annual wellness visit, subsequent 10/25/2015  . Back pain 10/31/2014  . Bipolar disorder ([place]) 05/21/2014  . 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 Insomnia 09/01/2011  . Obesity 05/04/2011  . HLD (hyperlipidemia) 01/20/2011  . SMOKER 01/20/2011  . Chronic pain syndrome 01/07/2011  . RENAL CALCULUS, RECURRENT 11/13/2010  . MULTIPLE SCLEROSIS, PROGRESSIVE/RELAPSING 08/28/2010  . GERD 08/28/2010  . Osteoarthritis 08/28/2010  . NEPHROLITHIASIS, HX OF 08/28/2010  Jamey Reas PT, DPT 02/05/2017, 11:29 AM  [facility] [address] [place], [place], [address] Phone: [phone]   Fax:  [phone]  Name: KEI LANGHORST MRN: [id number] Date of Birth: July 29, 1961

## 2017-02-16 ENCOUNTER — Telehealth: Payer: Self-pay | Admitting: Urology

## 2017-02-16 NOTE — Telephone Encounter (Signed)
Emily Moon from [facility] called office left voicemail asking to have someone call back to clarify a Rx for Emily Moon. Phone # [phone] reference # [id number]. Please advise. Thanks.

## 2017-02-16 NOTE — Telephone Encounter (Signed)
LMOM at pt insurance company.

## 2017-02-17 ENCOUNTER — Telehealth: Payer: Self-pay | Admitting: Urology

## 2017-02-17 ENCOUNTER — Telehealth: Payer: Self-pay

## 2017-02-17 NOTE — Telephone Encounter (Signed)
LMOM at [facility]

## 2017-02-17 NOTE — Telephone Encounter (Signed)
Ivin Booty with [facility] left v/m requesting cb about med using ref # [id number]. I called and got v/m and was advised to leave message; left v/m requesting cb.

## 2017-02-17 NOTE — Telephone Encounter (Signed)
Ivin Booty w/[facility] called about a medication request.  Please call her at [phone] with ref# [id number].

## 2017-02-17 NOTE — Telephone Encounter (Signed)
Not exactly sure why pt is needing myrbetriq PA. Samples were given for stone/stent pain.

## 2017-02-22 ENCOUNTER — Other Ambulatory Visit: Payer: Self-pay | Admitting: Primary Care

## 2017-02-22 ENCOUNTER — Encounter: Payer: Self-pay | Admitting: Primary Care

## 2017-02-22 NOTE — Telephone Encounter (Signed)
Pt left v/m requesting tizanidine instead of methocarbamal because methocarbamol is not helping. Pt request cb.

## 2017-02-23 ENCOUNTER — Ambulatory Visit: Payer: PPO | Attending: Primary Care | Admitting: Physical Therapy

## 2017-02-23 ENCOUNTER — Encounter: Payer: Self-pay | Admitting: Physical Therapy

## 2017-02-23 ENCOUNTER — Ambulatory Visit: Payer: PPO | Admitting: Speech Pathology

## 2017-02-23 ENCOUNTER — Other Ambulatory Visit: Payer: Self-pay | Admitting: Primary Care

## 2017-02-23 ENCOUNTER — Ambulatory Visit: Payer: PPO | Admitting: Occupational Therapy

## 2017-02-23 DIAGNOSIS — R278 Other lack of coordination: Secondary | ICD-10-CM | POA: Insufficient documentation

## 2017-02-23 DIAGNOSIS — R41841 Cognitive communication deficit: Secondary | ICD-10-CM | POA: Diagnosis not present

## 2017-02-23 DIAGNOSIS — M545 Low back pain, unspecified: Secondary | ICD-10-CM

## 2017-02-23 DIAGNOSIS — R42 Dizziness and giddiness: Secondary | ICD-10-CM | POA: Insufficient documentation

## 2017-02-23 DIAGNOSIS — M25552 Pain in left hip: Secondary | ICD-10-CM | POA: Insufficient documentation

## 2017-02-23 DIAGNOSIS — G8929 Other chronic pain: Secondary | ICD-10-CM | POA: Diagnosis not present

## 2017-02-23 DIAGNOSIS — R279 Unspecified lack of coordination: Secondary | ICD-10-CM | POA: Diagnosis not present

## 2017-02-23 DIAGNOSIS — R2689 Other abnormalities of gait and mobility: Secondary | ICD-10-CM | POA: Diagnosis not present

## 2017-02-23 DIAGNOSIS — R2681 Unsteadiness on feet: Secondary | ICD-10-CM | POA: Diagnosis not present

## 2017-02-23 DIAGNOSIS — M25551 Pain in right hip: Secondary | ICD-10-CM

## 2017-02-23 DIAGNOSIS — M6281 Muscle weakness (generalized): Secondary | ICD-10-CM | POA: Diagnosis not present

## 2017-02-23 DIAGNOSIS — M62838 Other muscle spasm: Secondary | ICD-10-CM

## 2017-02-23 MED ORDER — TIZANIDINE HCL 4 MG PO TABS: 8.0000 mg | ORAL_TABLET | Freq: Three times a day (TID) | ORAL | 5 refills | Status: DC | PRN

## 2017-02-23 NOTE — Patient Instructions (Signed)
  Coordination Activities  Perform the following activities for 20 minutes 1 times per day with both hand(s).   Rotate ball in fingertips (clockwise and counter-clockwise).  Toss ball between hands.  Toss ball in air and catch with the same hand.  Flip cards 1 at a time   Deal cards with your thumb (Hold deck in hand and push card off top with thumb).  Pick up coins, buttons, marbles, dried beans/pasta of different sizes and place in container.  Pick up coins and place in container or coin bank.  Pick up coins one at a time until you get 5-10 in your hand, then move coins from palm to fingertips to stack one at a time.  Practice writing and/or typing, slow down, write BIG!  Screw together nuts and bolts, then unfasten.

## 2017-02-23 NOTE — Telephone Encounter (Signed)
PA for myrbetriq DENIED!  

## 2017-02-23 NOTE — Patient Instructions (Signed)
   Cognitive Activities you can do at home:   - [place] (easy level)  - [place]  On your computer, tablet or phone:  [email or web address] Teacher, English as a foreign language

## 2017-02-23 NOTE — Therapy (Signed)
[facility] [address] [place], [place], [address] Phone: [phone]   Fax:  [phone]  Physical Therapy Treatment  Patient Details  Name: Emily Moon MRN: [id number] Date of Birth: [birth year] Referring Provider: [name], NP (Dr. Chipper Oman at [facility])  Encounter Date: 02/23/2017      PT End of Session - 02/23/17 1639    Visit Number 2   Number of Visits 17   Date for PT Re-Evaluation 04/05/17   Authorization Type [employer] G-code   PT Start Time 1449   PT Stop Time 1535   PT Time Calculation (min) 46 min   Activity Tolerance Patient tolerated treatment well   Behavior During Therapy [facility] for tasks assessed/performed      Past Medical History:  Diagnosis Date  . Arthritis    osteo  . Asthma   . Bipolar disorder ([place]) 05/21/14  . Cataracts, bilateral   . DDD (degenerative disc disease), cervical    also back  . Depression   . Dizziness    Positional  . Edema    feet/legs  . Fibromyalgia syndrome   . Fungal infection    Finger nails  . GERD (gastroesophageal reflux disease)   . Gout   . Headache    seasonal allergies  . Heart palpitations   . 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 Hypercholesterolemia   . Multiple sclerosis (HCC)    weakness  . Nephrolithiasis    kidney stones  . Osteoporosis    osteoarthritis  . Pneumonia   . PONV (postoperative nausea and vomiting)    no problem after cataract surgery  . Psoriasis   . Renal stone   . Shortness of breath dyspnea    wheezing  . Sleep apnea 2012   sleep study / Moon, no interventions  . Urinary frequency     Past Surgical History:  Procedure Laterality Date  . CATARACT EXTRACTION W/PHACO Left 05/21/2015   Procedure: CATARACT EXTRACTION PHACO AND INTRAOCULAR LENS PLACEMENT (IOC);  Surgeon: Birder Robson, MD;  Location: [facility];  Service: Ophthalmology;  Laterality: Left;  [place] 00:35 AP%  22.9 CDE 8.11 fluid pack lot #[id number] H  . CATARACT EXTRACTION W/PHACO Right 06/04/2015   Procedure: CATARACT EXTRACTION PHACO AND INTRAOCULAR LENS PLACEMENT (IOC);  Surgeon: Birder Robson, MD;  Location: [facility];  Service: Ophthalmology;  Laterality: Right;  US:00:48 AP%: 10.5 CDE:5.08 Fluid lot #[id number] H  . CYSTOSCOPY/URETEROSCOPY/HOLMIUM LASER/STENT PLACEMENT Bilateral 09/22/2016   Procedure: CYSTOSCOPY/URETEROSCOPY/HOLMIUM LASER/STENT PLACEMENT;  Surgeon: Hollice Espy, MD;  Location: [facility];  Service: Urology;  Laterality: Bilateral;  . EYE SURGERY  2015   tissue biopsy  . FOOT SURGERY  2015  . JOINT REPLACEMENT Left 2013   hip replacement  . LITHOTRIPSY    . PTOSIS REPAIR Bilateral 02/18/2016   Procedure: BILATERAL PTOSIS REPAIR UPPER EYELIDS;  Surgeon: Karle Starch, MD;  Location: [facility];  Service: Ophthalmology;  Laterality: Bilateral;  LEAVE PT EARLY AM  . thumb surgery Right   . TONSILLECTOMY  1973    There were no vitals filed for this visit.      Subjective Assessment - 02/23/17 1451    Subjective Pt reporting significant pain in neck, shoulders and low back-reports this is her baseline.     Pertinent History bi-polar disorder, history of L THA  2013, OA needs Right THA, gout, DDD, chronic pain/fibromyalgia, MS, lower leg nuumbness, positional dizziness, osteoporosis, kidney stones   Limitations Lifting;Standing;Walking;House hold activities  Patient Stated Goals improve walking and balance   Currently in Pain? Yes   Pain Score 3    Pain Location Generalized   Pain Orientation Lower;Upper   Pain Descriptors / Indicators Aching   Pain Type Chronic pain           [facility] Adult PT Treatment/Exercise - 02/23/17 1638      Exercises   Exercises Knee/Hip     Knee/Hip Exercises: Standing   Wall Squat 3 sets;Other (comment)  20 second hold           Balance Exercises - 02/23/17 1527      Balance Exercises: Standing   Standing Eyes Opened --    Standing Eyes Closed Narrow base of support (BOS);Foam/compliant surface;2 reps;10 secs;Solid surface  partial tandem stance w/head turns; compliant feet together   Tandem Gait Forward;Retro;Intermittent upper extremity support;4 reps   Retro Gait 4 reps   Other Standing Exercises hip extension, 12 reps each side, bilat UE support     OTAGO PROGRAM   Hip ABductor Other reps (comment)  12 reps each side, bilat UE support           PT Education - 02/23/17 1639    Education provided Yes   Education Details strengthening HEP   Person(s) Educated Patient   Methods Explanation;Demonstration;Handout   Comprehension Verbalized understanding;Returned demonstration          PT Short Term Goals - 02/04/17 1308      PT SHORT TERM GOAL #1   Title Patient demonstrates & verbalizes understanding of initial HEP.  (Target Date: 03/12/2017)   Time 1   Period Months   Status New     PT SHORT TERM GOAL #2   Title Patient verbalizes energy conservation techniques for MS. (Target Date: 03/12/2017)   Time 1   Period Months   Status New     PT SHORT TERM GOAL #3   Title Patient ambulates with head turns to scan environment during gait & maintains path with supervision.  (Target Date: 03/12/2017)   Time 1   Period Months   Status New           PT Long Term Goals - 02/04/17 1310      PT LONG TERM GOAL #1   Title Patient demonstrates & verbalizes understanding of ongoing HEP / fitness plan including MS modifications.  (Target Date: 04/06/2017)   Time 2   Period Months   Status New     PT LONG TERM GOAL #2   Title Berg Balance >/= 45/56 to indicate lower fall risk.  (Target Date: 04/06/2017)   Time 2   Period Months   Status New     PT LONG TERM GOAL #3   Title Functional Gait Assessment >/= 16/30 to indicate lower fall risk.  (Target Date: 04/06/2017)   Time 2   Period Months   Status New     PT LONG TERM GOAL #4   Title Timed Up & Go <13.5 sec and cognitive TUG increases  <25% to indicate lower fall risk.  (Target Date: 04/06/2017)   Time 2   Period Months   Status New            Plan - 02/23/17 1640    Clinical Impression Statement Treatment session with focus on initiation of standing balance and LE strengthening HEP for home with focus on narrow BOS, single limb stance with contralateral LE strengthening, multi sensory use for balance and core activation.  Pt  tolerated well.  Will continue to address to reach targeted goals.  Pt frequency decreased to 1x/week per pt wishes due to pt having to see all 3 disciplines and financial responsibility.   Clinical Impairments Affecting Rehab Potential bi-polar disorder, history of L THA  2013, OA needs Right THA, gout, DDD, chronic pain/fibromyalgia, MS, lower leg nuumbness, positional dizziness, osteoporosis, kidney stones   PT Treatment/Interventions ADLs/Self Care Home Management;DME Instruction;Gait training;Stair training;Functional mobility training;Therapeutic activities;Therapeutic exercise;Balance training;Neuromuscular re-education;Patient/family education   PT Next Visit Plan review HEP; progress as able   Consulted and Agree with Plan of Care Patient      Patient will benefit from skilled therapeutic intervention in order to improve the following deficits and impairments:  Abnormal gait, Decreased activity tolerance, Decreased balance, Decreased cognition, Decreased coordination, Decreased endurance, Decreased knowledge of precautions, Decreased mobility, Decreased safety awareness, Decreased strength, Dizziness, Impaired flexibility, Postural dysfunction  Visit Diagnosis: Unsteadiness on feet  Dizziness and giddiness  Other abnormalities of gait and mobility  Chronic midline low back pain without sciatica  Muscle weakness (generalized)  Pain in left hip  Pain in right hip     Problem List Patient Active Problem List   Diagnosis Date Noted  . B12 deficiency 05/22/2016  . Dizziness  03/16/2016  . Medicare annual wellness visit, subsequent 10/25/2015  . Back pain 10/31/2014  . Bipolar disorder ([place]) 05/21/2014  . 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 Insomnia 09/01/2011  . Obesity 05/04/2011  . HLD (hyperlipidemia) 01/20/2011  . SMOKER 01/20/2011  . Chronic pain syndrome 01/07/2011  . RENAL CALCULUS, RECURRENT 11/13/2010  . MULTIPLE SCLEROSIS, PROGRESSIVE/RELAPSING 08/28/2010  . GERD 08/28/2010  . Osteoarthritis 08/28/2010  . NEPHROLITHIASIS, HX OF 08/28/2010   Raylene Everts, PT, DPT 02/23/17    4:45 PM    [facility] [address] [place], [place], [address] Phone: [phone]   Fax:  [phone]  Name: Emily Moon MRN: [id number] Date of Birth: 10/20/1961

## 2017-02-23 NOTE — Telephone Encounter (Signed)
LMOM at [facility]

## 2017-02-23 NOTE — Patient Instructions (Signed)
FUNCTIONAL MOBILITY: Wall Squat    Stance: shoulder-width on floor, against wall. Place feet in front of hips. Bend hips and knees. Keep back straight. Do not allow knees to bend past toes. Squeeze glutes and quads to stand. _5__ reps per set, _2__ sets per day, _5__ days per week  Hip Backward Kick   Using a chair for balance, keep legs shoulder width apart and toes pointed for- ward. Slowly extend one leg back, keeping knee straight. Do not lean forward. Repeat with other leg. Repeat 12 times. Do 2 sessions per day.  http://gt2.exer.us/340   Copyright  VHI. All rights reserved.     Hip Side Kick   Holding a chair for balance, keep legs shoulder width apart and toes pointed forward. Swing a leg out to side, keeping knee straight. Do not lean. Repeat using other leg. Repeat 12 times. Do 2 sessions per day.   Copyright  VHI. All rights reserved.  Feet Partial Heel-Toe, Head Motion - Eyes Closed   With eyes closed and right foot partially in front of the other, move head slowly, up and down 10 times, side to side 10 times. Repeat 1 times per session. Do 2 sessions per day.   Feet Together (Compliant Surface) Head Motion - Eyes Closed    Stand on compliant surface: pillow with feet together. Close eyes and move head slowly, up and down 10 times, side to side 10 times. Repeat 1 times per session. Do 2 sessions per day.   AMBULATION: Walk Backward   Walk backward. Take large steps, do not drag feet. 10 reps per set, 2 sets per day, 5 days per week Use assistive device. Walk  Copyright  VHI. All rights reserved.   Feet Heel-Toe "Tandem"    Arms touching counter, walk a straight line bringing one foot directly in front of the other. Repeat for _10___forwards and backwards per session. Do _2___ sessions per day.    Marland Kitchen

## 2017-02-23 NOTE — Therapy (Signed)
[facility] [address] [place], [place], [address] Phone: [phone]   Fax:  [phone]  Speech Language Pathology Treatment  Patient Details  Name: Emily Moon MRN: [id number] Date of Birth: Nov 02, 1961 Referring Provider: [name], NP, Dr. Chipper Oman [facility])  Encounter Date: 02/23/2017      End of Session - 02/23/17 1416    Visit Number 2   Number of Visits 17   Date for SLP Re-Evaluation 03/09/17   SLP Start Time 1316   SLP Stop Time  1400   SLP Time Calculation (min) 44 min   Activity Tolerance Patient tolerated treatment well      Past Medical History:  Diagnosis Date  . Arthritis    osteo  . Asthma   . Bipolar disorder ([place]) 05/21/14  . Cataracts, bilateral   . DDD (degenerative disc disease), cervical    also back  . Depression   . Dizziness    Positional  . Edema    feet/legs  . Fibromyalgia syndrome   . Fungal infection    Finger nails  . GERD (gastroesophageal reflux disease)   . Gout   . Headache    seasonal allergies  . Heart palpitations   . 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 Hypercholesterolemia   . Multiple sclerosis (HCC)    weakness  . Nephrolithiasis    kidney stones  . Osteoporosis    osteoarthritis  . Pneumonia   . PONV (postoperative nausea and vomiting)    no problem after cataract surgery  . Psoriasis   . Renal stone   . Shortness of breath dyspnea    wheezing  . Sleep apnea 2012   sleep study / slight, no interventions  . Urinary frequency     Past Surgical History:  Procedure Laterality Date  . CATARACT EXTRACTION W/PHACO Left 05/21/2015   Procedure: CATARACT EXTRACTION PHACO AND INTRAOCULAR LENS PLACEMENT (IOC);  Surgeon: Birder Robson, MD;  Location: [facility];  Service: Ophthalmology;  Laterality: Left;  [place] 00:35 AP% 22.9 CDE 8.11 fluid pack lot #[id number] H  . CATARACT EXTRACTION W/PHACO Right 06/04/2015   Procedure: CATARACT  EXTRACTION PHACO AND INTRAOCULAR LENS PLACEMENT (IOC);  Surgeon: Birder Robson, MD;  Location: [facility];  Service: Ophthalmology;  Laterality: Right;  US:00:48 AP%: 10.5 CDE:5.08 Fluid lot #[id number] H  . CYSTOSCOPY/URETEROSCOPY/HOLMIUM LASER/STENT PLACEMENT Bilateral 09/22/2016   Procedure: CYSTOSCOPY/URETEROSCOPY/HOLMIUM LASER/STENT PLACEMENT;  Surgeon: Hollice Espy, MD;  Location: [facility];  Service: Urology;  Laterality: Bilateral;  . EYE SURGERY  2015   tissue biopsy  . FOOT SURGERY  2015  . JOINT REPLACEMENT Left 2013   hip replacement  . LITHOTRIPSY    . PTOSIS REPAIR Bilateral 02/18/2016   Procedure: BILATERAL PTOSIS REPAIR UPPER EYELIDS;  Surgeon: Karle Starch, MD;  Location: [facility];  Service: Ophthalmology;  Laterality: Bilateral;  LEAVE PT EARLY AM  . thumb surgery Right   . TONSILLECTOMY  1973    There were no vitals filed for this visit.      Subjective Assessment - 02/23/17 1320    Subjective "My brain does what it does - sometimes it works and sometimes it doesn't"               ADULT SLP TREATMENT - 02/23/17 1321      General Information   Behavior/Cognition Alert;Cooperative;Pleasant mood     Treatment Provided   Treatment provided Cognitive-Linquistic     Pain Assessment   Pain Assessment 0-10  Pain Score 8    Pain Location neck and back   Pain Descriptors / Indicators Constant;Aching   Pain Intervention(s) Monitored during session     Cognitive-Linquistic Treatment   Treatment focused on Cognition   Skilled Treatment Facilitated mildly complex reasoning with occasional min to mod A - pt verbalized some surprise that she had difficulty with this task. Alternating attention and reasoning with mildly complex deduction puzzle with extended time and occasional mod A for organization and deduction. Alternated attention between mildly complex card sort of 3 piles each with a different rule with ongoing cues to attend to all piles and all  cards in her hand.      Assessment / Recommendations / Plan   Plan Continue with current plan of care     Progression Toward Goals   Progression toward goals Progressing toward goals          SLP Education - 02/23/17 1413    Education provided Yes   Education Details cognitive activities to do at home   Person(s) Educated Patient   Methods Explanation;Demonstration   Comprehension Verbalized understanding;Returned demonstration          SLP Short Term Goals - 02/23/17 1416      SLP SHORT TERM GOAL #1   Title Pt will attend to details in mildly complex cognitive linguistic activity with 95% accuracy and rare min A   Time 4   Period Weeks   Status On-going     SLP SHORT TERM GOAL #2   Title Pt will alternate attention betwen 2 mildly complex cognitive linguistic activities with 90% on each and occasional min A   Time 4   Period Weeks   Status On-going     SLP SHORT TERM GOAL #3   Title Pt will demonstrate carryover of daily journal or notes in phone to recall pertinent daily events over 3 sessions   Time 4   Period Weeks   Status On-going     SLP SHORT TERM GOAL #4   Title Pt will demonstrate compensations for dysnomia in simple conversation with rare min A   Status On-going          SLP Long Term Goals - 02/23/17 1416      SLP LONG TERM GOAL #1   Title Pt will carryover journaling/phone notes to recall pertinent daily information over 5 sessions with rare min A   Time 8   Period Weeks   Status On-going     SLP LONG TERM GOAL #2   Title Pt will divide attention between 2 mildly complex cognitive linguistc tasks with 85% on each and occasional min A   Time 8   Period Weeks   Status On-going     SLP LONG TERM GOAL #3   Title Pt will verbalize compensations for attention with rare min A   Time 8   Period Weeks   Status On-going          Plan - 02/23/17 1414    Clinical Impression Statement Pt required extended time and occasional mod A for mildly  complex reasoning, alternating attention and attention to details. Continue skilled ST to maximize cognition and compensations for attention/recall for improved independence and QOL   Speech Therapy Frequency 2x / week   Potential to Achieve Goals Fair   Potential Considerations Medical prognosis;Severity of impairments;Ability to learn/carryover information   Consulted and Agree with Plan of Care Patient      Patient will benefit from skilled  therapeutic intervention in order to improve the following deficits and impairments:   Cognitive communication deficit    Problem List Patient Active Problem List   Diagnosis Date Noted  . B12 deficiency 05/22/2016  . Dizziness 03/16/2016  . Medicare annual wellness visit, subsequent 10/25/2015  . Back pain 10/31/2014  . Bipolar disorder ([place]) 05/21/2014  . 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 Insomnia 09/01/2011  . Obesity 05/04/2011  . HLD (hyperlipidemia) 01/20/2011  . SMOKER 01/20/2011  . Chronic pain syndrome 01/07/2011  . RENAL CALCULUS, RECURRENT 11/13/2010  . MULTIPLE SCLEROSIS, PROGRESSIVE/RELAPSING 08/28/2010  . GERD 08/28/2010  . Osteoarthritis 08/28/2010  . NEPHROLITHIASIS, HX OF 08/28/2010    Lovvorn, Annye Rusk MS, CCC-SLP 02/23/2017, 2:17 PM  [facility] [address] [place], [place], [address] Phone: [phone]   Fax:  [phone]   Name: SEDALIA GREESON MRN: [id number] Date of Birth: 10-16-1961

## 2017-02-23 NOTE — Therapy (Signed)
[place] [address] [place], [place], [address] Phone: [phone]   Fax:  [phone]  Occupational Therapy Treatment  Patient Details  Name: Emily Moon MRN: [id number] Date of Birth: 10-09-1961 Referring Provider: [name] NP  Encounter Date: 02/23/2017      OT End of Session - 02/23/17 1650    Visit Number 2   Number of Visits 17   Date for OT Re-Evaluation 04/04/17   Authorization Type [employer]   Authorization Time Period 60 days- recert 2/35/57-02/04/01   Authorization - Visit Number 2   Authorization - Number of Visits 10   OT Start Time 1404   OT Stop Time 1445   OT Time Calculation (min) 41 min   Activity Tolerance Patient tolerated treatment well   Behavior During Therapy [facility] for tasks assessed/performed      Past Medical History:  Diagnosis Date  . Arthritis    osteo  . Asthma   . Bipolar disorder ([place]) 05/21/14  . Cataracts, bilateral   . DDD (degenerative disc disease), cervical    also back  . Depression   . Dizziness    Positional  . Edema    feet/legs  . Fibromyalgia syndrome   . Fungal infection    Finger nails  . GERD (gastroesophageal reflux disease)   . Gout   . Headache    seasonal allergies  . Heart palpitations   . 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 Hypercholesterolemia   . Multiple sclerosis (HCC)    weakness  . Nephrolithiasis    kidney stones  . Osteoporosis    osteoarthritis  . Pneumonia   . PONV (postoperative nausea and vomiting)    no problem after cataract surgery  . Psoriasis   . Renal stone   . Shortness of breath dyspnea    wheezing  . Sleep apnea 2012   sleep study / slight, no interventions  . Urinary frequency     Past Surgical History:  Procedure Laterality Date  . CATARACT EXTRACTION W/PHACO Left 05/21/2015   Procedure: CATARACT EXTRACTION PHACO AND INTRAOCULAR LENS PLACEMENT (IOC);  Surgeon: Birder Robson, MD;  Location: [facility];  Service: Ophthalmology;  Laterality: Left;  [place] 00:35 AP% 22.9 CDE 8.11 fluid pack lot #[id number]  . CATARACT EXTRACTION W/PHACO Right 06/04/2015   Procedure: CATARACT EXTRACTION PHACO AND INTRAOCULAR LENS PLACEMENT (IOC);  Surgeon: Birder Robson, MD;  Location: [facility];  Service: Ophthalmology;  Laterality: Right;  US:00:48 AP%: 10.5 CDE:5.08 Fluid lot #[id number] H  . CYSTOSCOPY/URETEROSCOPY/HOLMIUM LASER/STENT PLACEMENT Bilateral 09/22/2016   Procedure: CYSTOSCOPY/URETEROSCOPY/HOLMIUM LASER/STENT PLACEMENT;  Surgeon: Hollice Espy, MD;  Location: [facility];  Service: Urology;  Laterality: Bilateral;  . EYE SURGERY  2015   tissue biopsy  . FOOT SURGERY  2015  . JOINT REPLACEMENT Left 2013   hip replacement  . LITHOTRIPSY    . PTOSIS REPAIR Bilateral 02/18/2016   Procedure: BILATERAL PTOSIS REPAIR UPPER EYELIDS;  Surgeon: Karle Starch, MD;  Location: [facility];  Service: Ophthalmology;  Laterality: Bilateral;  LEAVE PT EARLY AM  . thumb surgery Right   . TONSILLECTOMY  1973    There were no vitals filed for this visit.      Subjective Assessment - 02/23/17 1407    Subjective  Pt reports she cut herself with pill splitter   Pertinent History MS,bi-polar disorder, history of L THA  2013, OA needs Right THA, gout, DDD, chronic pain/fibromyalgia,  lower leg nuumbness, positional dizziness,  osteoporosis, kidney stones   Patient Stated Goals maintain her independence   Currently in Pain? Yes   Pain Score 3    Pain Location Generalized   Multiple Pain Sites No                              OT Education - 02/23/17 1651    Education provided Yes   Education Details Handwriting strategies, coordination HEP- see pt instructions   Person(s) Educated Patient   Methods Explanation;Demonstration;Verbal cues;Handout   Comprehension Verbalized understanding;Returned demonstration          OT Short Term Goals - 02/23/17 1651       OT SHORT TERM GOAL #1   Title I with HEP. due 03/24/17   Time 4   Period Weeks   Status New     OT SHORT TERM GOAL #2   Title Pt will verbalize understanding of adapted strategies(including AE/DME)/ energy conservation techniques to maximize safety and independence with ADLS/IADLs.   Time 4   Period Weeks   Status New     OT SHORT TERM GOAL #3   Title Pt will demonstrate ability to write a short paragraph with 100% legibility and no significant decrease in letter size.   Time 4   Period Weeks   Status New           OT Long Term Goals - 02/23/17 1652      OT LONG TERM GOAL #1   Title Pt will demonstrate improved standing balance for ADLS as evidenced by improving bilateral standing functional reach to 10 inches or greater. due 04/23/17   Baseline RUE 7 inches, LUE 9 inches   Time 8   Period Weeks   Status New     OT LONG TERM GOAL #2   Title Pt will demonstrate ability to retrieve a lightweight object at 130 shoulder flexion with RUE.   Time 8   Period Weeks   Status New     OT LONG TERM GOAL #3   Title Pt will verbalize understanding of ways to compensate for short term memory deficits.   Time 8   Period Weeks   Status New               Plan - 02/23/17 1409    Clinical Impression Statement Pt had a delay in her start of care due to pt's scheduling conflicts. Initial goals are still appropriate and therefore pt is recertified  to extend pt's end dated for therapy.   Rehab Potential Fair   OT Frequency 2x / week   OT Duration 8 weeks   OT Treatment/Interventions Self-care/ADL training;DME and/or AE instruction;Patient/family education;Balance training;Therapeutic exercises;Ultrasound;Therapeutic exercise;Therapeutic activities;Cognitive remediation/compensation;Passive range of motion;Functional Mobility Training;Neuromuscular education;Cryotherapy;Electrical Stimulation;Parrafin;Energy conservation;Manual Therapy;Visual/perceptual remediation/compensation    Plan handwriting, activity modification   Consulted and Agree with Plan of Care Patient      Patient will benefit from skilled therapeutic intervention in order to improve the following deficits and impairments:  Abnormal gait, Decreased cognition, Impaired flexibility, Pain, Impaired sensation, Decreased mobility, Decreased coordination, Decreased activity tolerance, Decreased endurance, Decreased range of motion, Decreased strength, Impaired tone, Impaired UE functional use, Difficulty walking, Decreased safety awareness, Decreased knowledge of precautions, Decreased balance  Visit Diagnosis: Muscle weakness (generalized)  Other lack of coordination    Problem List Patient Active Problem List   Diagnosis Date Noted  . B12 deficiency 05/22/2016  . Dizziness 03/16/2016  . Medicare annual  wellness visit, subsequent 10/25/2015  . Back pain 10/31/2014  . Bipolar disorder ([place]) 05/21/2014  . 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 Insomnia 09/01/2011  . Obesity 05/04/2011  . HLD (hyperlipidemia) 01/20/2011  . SMOKER 01/20/2011  . Chronic pain syndrome 01/07/2011  . RENAL CALCULUS, RECURRENT 11/13/2010  . MULTIPLE SCLEROSIS, PROGRESSIVE/RELAPSING 08/28/2010  . GERD 08/28/2010  . Osteoarthritis 08/28/2010  . NEPHROLITHIASIS, HX OF 08/28/2010    Tenna Lacko 02/23/2017, 4:53 PM Theone Murdoch, OTR/L [phone] Phone: [phone] 4:59 PM 02/23/17 [place] [address] [place], [place], [address] Phone: [phone]   Fax:  [phone]  Name: Emily Moon MRN: [id number] Date of Birth: 18-Dec-1960

## 2017-02-25 ENCOUNTER — Encounter: Payer: Self-pay | Admitting: Speech Pathology

## 2017-02-25 ENCOUNTER — Encounter: Payer: Self-pay | Admitting: Occupational Therapy

## 2017-02-25 ENCOUNTER — Ambulatory Visit: Payer: Self-pay | Admitting: Physical Therapy

## 2017-02-26 NOTE — Telephone Encounter (Signed)
I called [employer] and got v/m all representatives were tied up. Will wait for [employer] to cb if needed.

## 2017-03-04 ENCOUNTER — Ambulatory Visit: Payer: PPO | Admitting: Physical Therapy

## 2017-03-04 ENCOUNTER — Ambulatory Visit: Payer: PPO | Admitting: *Deleted

## 2017-03-04 ENCOUNTER — Encounter: Payer: Self-pay | Admitting: Physical Therapy

## 2017-03-04 ENCOUNTER — Encounter: Payer: Self-pay | Admitting: *Deleted

## 2017-03-04 ENCOUNTER — Encounter: Payer: Self-pay | Admitting: Occupational Therapy

## 2017-03-04 ENCOUNTER — Ambulatory Visit: Payer: PPO | Admitting: Speech Pathology

## 2017-03-04 DIAGNOSIS — R2681 Unsteadiness on feet: Secondary | ICD-10-CM

## 2017-03-04 DIAGNOSIS — M6281 Muscle weakness (generalized): Secondary | ICD-10-CM

## 2017-03-04 DIAGNOSIS — R279 Unspecified lack of coordination: Secondary | ICD-10-CM

## 2017-03-04 DIAGNOSIS — R278 Other lack of coordination: Secondary | ICD-10-CM

## 2017-03-04 DIAGNOSIS — M25551 Pain in right hip: Secondary | ICD-10-CM

## 2017-03-04 DIAGNOSIS — R41841 Cognitive communication deficit: Secondary | ICD-10-CM

## 2017-03-04 DIAGNOSIS — R2689 Other abnormalities of gait and mobility: Secondary | ICD-10-CM

## 2017-03-04 DIAGNOSIS — M25552 Pain in left hip: Secondary | ICD-10-CM

## 2017-03-04 NOTE — Therapy (Signed)
[place] [address] [place], [place], [address] Phone: [phone]   Fax:  [phone]  Occupational Therapy Treatment  Patient Details  Name: Emily Moon MRN: [id number] Date of Birth: 1961/10/21 Referring Provider: [name] NP  Encounter Date: 03/04/2017      OT End of Session - 03/04/17 1153    Visit Number 3   Number of Visits 17   Date for OT Re-Evaluation 04/04/17   Authorization Type [employer]   Authorization Time Period 60 days- recert 3/32/95-1/88/41   Authorization - Visit Number 3   Authorization - Number of Visits 10   OT Start Time 1101   OT Stop Time 1147   OT Time Calculation (min) 46 min   Activity Tolerance Patient tolerated treatment well   Behavior During Therapy [facility] for tasks assessed/performed;Anxious      Past Medical History:  Diagnosis Date  . Arthritis    osteo  . Asthma   . Bipolar disorder ([address]) 05/21/14  . Cataracts, bilateral   . DDD (degenerative disc disease), cervical    also back  . Depression   . Dizziness    Positional  . Edema    feet/legs  . Fibromyalgia syndrome   . Fungal infection    Finger nails  . GERD (gastroesophageal reflux disease)   . Gout   . Headache    seasonal allergies  . Heart palpitations   . 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 Hypercholesterolemia   . Multiple sclerosis (HCC)    weakness  . Nephrolithiasis    kidney stones  . Osteoporosis    osteoarthritis  . Pneumonia   . PONV (postoperative nausea and vomiting)    no problem after cataract surgery  . Psoriasis   . Renal stone   . Shortness of breath dyspnea    wheezing  . Sleep apnea 2012   sleep study / slight, no interventions  . Urinary frequency     Past Surgical History:  Procedure Laterality Date  . CATARACT EXTRACTION W/PHACO Left 05/21/2015   Procedure: CATARACT EXTRACTION PHACO AND INTRAOCULAR LENS PLACEMENT (IOC);  Surgeon:  Birder Robson, MD;  Location: [facility];  Service: Ophthalmology;  Laterality: Left;  [place] 00:35 AP% 22.9 CDE 8.11 fluid pack lot #[id number] H  . CATARACT EXTRACTION W/PHACO Right 06/04/2015   Procedure: CATARACT EXTRACTION PHACO AND INTRAOCULAR LENS PLACEMENT (IOC);  Surgeon: Birder Robson, MD;  Location: [facility];  Service: Ophthalmology;  Laterality: Right;  US:00:48 AP%: 10.5 CDE:5.08 Fluid lot #[id number] H  . CYSTOSCOPY/URETEROSCOPY/HOLMIUM LASER/STENT PLACEMENT Bilateral 09/22/2016   Procedure: CYSTOSCOPY/URETEROSCOPY/HOLMIUM LASER/STENT PLACEMENT;  Surgeon: Hollice Espy, MD;  Location: [facility];  Service: Urology;  Laterality: Bilateral;  . EYE SURGERY  2015   tissue biopsy  . FOOT SURGERY  2015  . JOINT REPLACEMENT Left 2013   hip replacement  . LITHOTRIPSY    . PTOSIS REPAIR Bilateral 02/18/2016   Procedure: BILATERAL PTOSIS REPAIR UPPER EYELIDS;  Surgeon: Karle Starch, MD;  Location: [facility];  Service: Ophthalmology;  Laterality: Bilateral;  LEAVE PT EARLY AM  . thumb surgery Right   . TONSILLECTOMY  1973    There were no vitals filed for this visit.      Subjective Assessment - 03/04/17 1106    Subjective  Pt reports "I'm having pain in neck 8/10, low back 8/10, legs 7/10 and MS hug 9/10 and I didn't even want to come today"   Pertinent History MS,bi-polar disorder, history of L  THA  2013, OA needs Right THA, gout, DDD, chronic pain/fibromyalgia,  lower leg nuumbness, positional dizziness, osteoporosis, kidney stones   Patient Stated Goals maintain her independence   Currently in Pain? Yes   Pain Score 9    Pain Location Generalized  Generalized pain, abdominal "binder" pain   Pain Orientation Lower;Upper   Pain Descriptors / Indicators Aching   Pain Type Chronic pain   Pain Onset More than a month ago   Pain Frequency Intermittent   Aggravating Factors  Moving wrong   Pain Relieving Factors Rest, meds   Effect of Pain on Daily Activities Limits daily  activity   Multiple Pain Sites --  See subjective                      OT Treatments/Exercises (OP) - 03/04/17 0001      ADLs   ADL Comments Discussed Energy conservation techniques and activity modification. for home making tasks. Issued and reviewed handout. x47min     Fine Motor Coordination   Other Fine Motor Exercises Handwriting techniques: Using various grip, adaptive size pens. Copying short paragraph. with focus on legibility, keeping words similiar size and not starting larger and going smaller. Issued lined paper for home use and educated pt to practice handwriting as part of HEP x15 min.      Energy Conservation Techniques  1. Sit for as many activities as possible. 2. Use slow, smooth movements.  Rushing increases discomfort. 3. Determine the necessity of performing the task.  Simplify those tasks that are necessary.  (Get clothes out of the dryer when they are warm instead of ironing, let dishes air dry, etc.) 4. Take frequent rests both during and between activities.  Avoid repetitive tasks. 5. Pre-plan your activities; try a daily and/or weekly schedule.  Spread out the activities that are most fatiguing (break up cleaning tasks over multiple days). 6. Remember to plan a balance of work, rest and recreation. 7. Consider the best time for each activity.  Do the most exertive task when you have the most energy. 8. Don't carry items if you can push them.  Slide, don't lift. Push, don't pull. 9. Utilize two hands when appropriate. 10. Maintain good posture and use proper body mechanics.  Avoid remaining in one position for too long.  When lifting, bend at the knees, not at the waist.  Exhale when bending down, inhale when straightening up.  Carry objects as close to your body and as near to the center of the pelvis.  11. Avoid wasted body movements (position yourself for the task so that you avoid bending, twisting, etc. when possible). 12. Select the best  working environment.  Consider lighting, ventilation, clothing, and equipment. 79. Organize your storage areas, making the items you use daily convenient.  Store heaviest items at waist height.  Store frequently used items between shoulders and knee height.  Consider leaving frequently used items on countertops.  (You can organize in storage baskets based on time used/purpose). 14. Feelings and emotions can be real causes of fatigue. Try to avoid unnecessary worry, irritation, or frustration.  Avoid stress, it can also be a source of fatigue. 15. Get help from other people for difficult tasks. 16. Explore equipment or items that may be able to do the job for you with greater ease.  (Electric can openers, blenders, lightweight items for cleaning, etc.)Modifications for Beginners (or Those with Back / Neck / Knee Concerns)  Practice handwriting at home using  grip on pen or pencil and lined paper to assist with maintaining size of letters/words and staying on lines.           OT Education - 03/04/17 1152    Education provided Yes   Education Details Energy conservation techniques & Handwriting strategies, issued lined paper, pt to practice at home.   Person(s) Educated Patient   Methods Explanation;Demonstration;Verbal cues   Comprehension Verbalized understanding;Returned demonstration          OT Short Term Goals - 02/23/17 1651      OT SHORT TERM GOAL #1   Title I with HEP. due 03/24/17   Time 4   Period Weeks   Status New     OT SHORT TERM GOAL #2   Title Pt will verbalize understanding of adapted strategies(including AE/DME)/ energy conservation techniques to maximize safety and independence with ADLS/IADLs.   Time 4   Period Weeks   Status New     OT SHORT TERM GOAL #3   Title Pt will demonstrate ability to write a short paragraph with 100% legibility and no significant decrease in letter size.   Time 4   Period Weeks   Status New           OT Long Term Goals -  02/23/17 1652      OT LONG TERM GOAL #1   Title Pt will demonstrate improved standing balance for ADLS as evidenced by improving bilateral standing functional reach to 10 inches or greater. due 04/23/17   Baseline RUE 7 inches, LUE 9 inches   Time 8   Period Weeks   Status New     OT LONG TERM GOAL #2   Title Pt will demonstrate ability to retrieve a lightweight object at 130 shoulder flexion with RUE.   Time 8   Period Weeks   Status New     OT LONG TERM GOAL #3   Title Pt will verbalize understanding of ways to compensate for short term memory deficits.   Time 8   Period Weeks   Status New               Plan - 03/04/17 1154    Clinical Impression Statement Focused on handwriting techniques today and educated pt in energy conservation and activity modification for ADL and home making tasks. Pt should benefit from cont toward plan of care and goals, focus on activity modification and coordination next visit.   Rehab Potential Fair   OT Frequency 2x / week   OT Duration 8 weeks   OT Treatment/Interventions Self-care/ADL training;DME and/or AE instruction;Patient/family education;Balance training;Therapeutic exercises;Ultrasound;Therapeutic exercise;Therapeutic activities;Cognitive remediation/compensation;Passive range of motion;Functional Mobility Training;Neuromuscular education;Cryotherapy;Electrical Stimulation;Parrafin;Energy conservation;Manual Therapy;Visual/perceptual remediation/compensation   Plan Coordination, putty and activity modification.   Consulted and Agree with Plan of Care Patient      Patient will benefit from skilled therapeutic intervention in order to improve the following deficits and impairments:  Abnormal gait, Decreased cognition, Impaired flexibility, Pain, Impaired sensation, Decreased mobility, Decreased coordination, Decreased activity tolerance, Decreased endurance, Decreased range of motion, Decreased strength, Impaired tone, Impaired UE  functional use, Difficulty walking, Decreased safety awareness, Decreased knowledge of precautions, Decreased balance  Visit Diagnosis: Muscle weakness (generalized)  Other lack of coordination  Unspecified lack of coordination    Problem List Patient Active Problem List   Diagnosis Date Noted  . B12 deficiency 05/22/2016  . Dizziness 03/16/2016  . Medicare annual wellness visit, subsequent 10/25/2015  . Back pain 10/31/2014  . Bipolar disorder (  [place]) 05/21/2014  . 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 Insomnia 09/01/2011  . Obesity 05/04/2011  . HLD (hyperlipidemia) 01/20/2011  . SMOKER 01/20/2011  . Chronic pain syndrome 01/07/2011  . RENAL CALCULUS, RECURRENT 11/13/2010  . MULTIPLE SCLEROSIS, PROGRESSIVE/RELAPSING 08/28/2010  . GERD 08/28/2010  . Osteoarthritis 08/28/2010  . NEPHROLITHIASIS, HX OF 08/28/2010    Percell Miller Ardath Sax, OTR/L 03/04/2017, 11:59 AM  [facility] [address] [place], [place], [address] Phone: [phone]   Fax:  [phone]  Name: NUPUR HOHMAN MRN: [id number] Date of Birth: 16-Jul-1961

## 2017-03-04 NOTE — Therapy (Signed)
[name] [place], [place], [address] Phone: [phone]   Fax:  [phone]  Speech Language Pathology Treatment  Patient Details  Name: Emily Moon MRN: [id number] Date of Birth: 1961-04-22 Referring Provider: [name], NP, Dr. Chipper Oman [facility])  Encounter Date: 03/04/2017      End of Session - 03/04/17 1322    Visit Number 3   Number of Visits 17   Date for SLP Re-Evaluation 03/09/17   SLP Start Time 1232   SLP Stop Time  1316   SLP Time Calculation (min) 44 min   Activity Tolerance Patient tolerated treatment well;No increased pain      Past Medical History:  Diagnosis Date  . Arthritis    osteo  . Asthma   . Bipolar disorder ([place]) 05/21/14  . Cataracts, bilateral   . DDD (degenerative disc disease), cervical    also back  . Depression   . Dizziness    Positional  . Edema    feet/legs  . Fibromyalgia syndrome   . Fungal infection    Finger nails  . GERD (gastroesophageal reflux disease)   . Gout   . Headache    seasonal allergies  . Heart palpitations   . 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 Hypercholesterolemia   . Multiple sclerosis (HCC)    weakness  . Nephrolithiasis    kidney stones  . Osteoporosis    osteoarthritis  . Pneumonia   . PONV (postoperative nausea and vomiting)    no problem after cataract surgery  . Psoriasis   . Renal stone   . Shortness of breath dyspnea    wheezing  . Sleep apnea 2012   sleep study / slight, no interventions  . Urinary frequency     Past Surgical History:  Procedure Laterality Date  . CATARACT EXTRACTION W/PHACO Left 05/21/2015   Procedure: CATARACT EXTRACTION PHACO AND INTRAOCULAR LENS PLACEMENT (IOC);  Surgeon: Birder Robson, MD;  Location: [facility];  Service: Ophthalmology;  Laterality: Left;  [place] 00:35 AP% 22.9 CDE 8.11 fluid pack lot #[id number]  . CATARACT EXTRACTION W/PHACO Right 06/04/2015   Procedure: CATARACT EXTRACTION PHACO AND INTRAOCULAR LENS PLACEMENT (IOC);  Surgeon: Birder Robson, MD;  Location: [facility];  Service: Ophthalmology;  Laterality: Right;  US:00:48 AP%: 10.5 CDE:5.08 Fluid lot #[id number]  . CYSTOSCOPY/URETEROSCOPY/HOLMIUM LASER/STENT PLACEMENT Bilateral 09/22/2016   Procedure: CYSTOSCOPY/URETEROSCOPY/HOLMIUM LASER/STENT PLACEMENT;  Surgeon: Hollice Espy, MD;  Location: [facility];  Service: Urology;  Laterality: Bilateral;  . EYE SURGERY  2015   tissue biopsy  . FOOT SURGERY  2015  . JOINT REPLACEMENT Left 2013   hip replacement  . LITHOTRIPSY    . PTOSIS REPAIR Bilateral 02/18/2016   Procedure: BILATERAL PTOSIS REPAIR UPPER EYELIDS;  Surgeon: Karle Starch, MD;  Location: [facility];  Service: Ophthalmology;  Laterality: Bilateral;  LEAVE PT EARLY AM  . thumb surgery Right   . TONSILLECTOMY  1973    There were no vitals filed for this visit.      Subjective Assessment - 03/04/17 1237    Subjective "I almost bailed, I didn't want to be here"               ADULT SLP TREATMENT - 03/04/17 1240      General Information   Behavior/Cognition Alert;Cooperative;Pleasant mood     Treatment Provided   Treatment provided Cognitive-Linquistic     Pain Assessment   Pain Assessment 0-10   Pain Score 8  Pain Location neck and back, bilater LE   Pain Descriptors / Indicators Constant;Aching   Pain Intervention(s) Monitored during session     Cognitive-Linquistic Treatment   Treatment focused on Cognition   Skilled Treatment Alternating attention facilitated with mildly complex divergent categorization chart (written) and auditory/verbal task  verbalizing bills and coins for a given amount with rare min request for repetition of money amount.   Rare min A for category matrix. Trained in compensations for word finding describing medium frequency words for ST to guess with rare min A - pt instructed to utilize these strategies when  experiencing word finding difficulties.      Assessment / Recommendations / Plan   Plan Continue with current plan of care     Progression Toward Goals   Progression toward goals Progressing toward goals          SLP Education - 03/04/17 1319    Education provided Yes   Education Details compensations for word finding episodes   Person(s) Educated Patient   Methods Explanation;Demonstration;Verbal cues   Comprehension Verbalized understanding;Returned demonstration;Need further instruction          SLP Short Term Goals - 03/04/17 1322      SLP SHORT TERM GOAL #1   Title Pt will attend to details in mildly complex cognitive linguistic activity with 95% accuracy and rare min A   Time 3   Period Weeks   Status On-going     SLP SHORT TERM GOAL #2   Title Pt will alternate attention betwen 2 mildly complex cognitive linguistic activities with 90% on each and occasional min A   Time 3   Period Weeks   Status On-going     SLP SHORT TERM GOAL #3   Title Pt will demonstrate carryover of daily journal or notes in phone to recall pertinent daily events over 3 sessions   Time 3   Period Weeks   Status On-going     SLP SHORT TERM GOAL #4   Title Pt will demonstrate compensations for dysnomia in simple conversation with rare min A   Time 3   Status On-going          SLP Long Term Goals - 03/04/17 1322      SLP LONG TERM GOAL #1   Title Pt will carryover journaling/phone notes to recall pertinent daily information over 5 sessions with rare min A   Time 7   Period Weeks   Status On-going     SLP LONG TERM GOAL #2   Title Pt will divide attention between 2 mildly complex cognitive linguistc tasks with 85% on each and occasional min A   Time 7   Period Weeks   Status On-going     SLP LONG TERM GOAL #3   Title Pt will verbalize compensations for attention with rare min A   Time 7   Period Weeks   Status On-going          Plan - 03/04/17 1319    Clinical  Impression Statement Alternating attention between to mildly complex naming and simple functional math with rare min A and 85% on each task, occasional request for repetition. Pt required occasional min A to carryover strategies for word finding episodes. Continue skilled ST to maximize cognition for improved independence and QOL.    Speech Therapy Frequency 2x / week   Treatment/Interventions Compensatory strategies;Patient/family education;Functional tasks;Cognitive reorganization;Multimodal communcation approach;Internal/external aids;SLP instruction and feedback;Language facilitation   Potential to [place]  Potential Considerations Medical prognosis;Severity of impairments;Ability to learn/carryover information   Consulted and Agree with Plan of Care Patient      Patient will benefit from skilled therapeutic intervention in order to improve the following deficits and impairments:   Cognitive communication deficit    Problem List Patient Active Problem List   Diagnosis Date Noted  . B12 deficiency 05/22/2016  . Dizziness 03/16/2016  . Medicare annual wellness visit, subsequent 10/25/2015  . Back pain 10/31/2014  . Bipolar disorder ([place]) 05/21/2014  . 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 Insomnia 09/01/2011  . Obesity 05/04/2011  . HLD (hyperlipidemia) 01/20/2011  . SMOKER 01/20/2011  . Chronic pain syndrome 01/07/2011  . RENAL CALCULUS, RECURRENT 11/13/2010  . MULTIPLE SCLEROSIS, PROGRESSIVE/RELAPSING 08/28/2010  . GERD 08/28/2010  . Osteoarthritis 08/28/2010  . NEPHROLITHIASIS, HX OF 08/28/2010    Lovvorn, Annye Rusk MS, CCC-SLP 03/04/2017, 1:23 PM  [facility] [address] [place], [place], [address] Phone: [phone]   Fax:  [phone]   Name: ARCADIA GORGAS MRN: [id number] Date of  Birth: 07-Jul-1961

## 2017-03-04 NOTE — Patient Instructions (Addendum)
Energy Conservation Techniques  1. Sit for as many activities as possible. 2. Use slow, smooth movements.  Rushing increases discomfort. 3. Determine the necessity of performing the task.  Simplify those tasks that are necessary.  (Get clothes out of the dryer when they are warm instead of ironing, let dishes air dry, etc.) 4. Take frequent rests both during and between activities.  Avoid repetitive tasks. 5. Pre-plan your activities; try a daily and/or weekly schedule.  Spread out the activities that are most fatiguing (break up cleaning tasks over multiple days). 6. Remember to plan a balance of work, rest and recreation. 7. Consider the best time for each activity.  Do the most exertive task when you have the most energy. 8. Don't carry items if you can push them.  Slide, don't lift. Push, don't pull. 9. Utilize two hands when appropriate. 10. Maintain good posture and use proper body mechanics.  Avoid remaining in one position for too long.  When lifting, bend at the knees, not at the waist.  Exhale when bending down, inhale when straightening up.  Carry objects as close to your body and as near to the center of the pelvis.  11. Avoid wasted body movements (position yourself for the task so that you avoid bending, twisting, etc. when possible). 12. Select the best working environment.  Consider lighting, ventilation, clothing, and equipment. 42. Organize your storage areas, making the items you use daily convenient.  Store heaviest items at waist height.  Store frequently used items between shoulders and knee height.  Consider leaving frequently used items on countertops.  (You can organize in storage baskets based on time used/purpose). 14. Feelings and emotions can be real causes of fatigue. Try to avoid unnecessary worry, irritation, or frustration.  Avoid stress, it can also be a source of fatigue. 15. Get help from other people for difficult tasks. 16. Explore equipment or items that  may be able to do the job for you with greater ease.  (Electric can openers, blenders, lightweight items for cleaning, etc.)Modifications for Beginners (or Those with Back / Neck / Knee Concerns)  Practice handwriting at homt using grip on pen or pencil and lined paper to assist with maintaining size of letters/words and staying on lines.

## 2017-03-04 NOTE — Therapy (Signed)
[facility] [address] [place], [place], [address] Phone: [phone]   Fax:  [phone]  Physical Therapy Treatment  Patient Details  Name: Emily Moon MRN: [id number] Date of Birth: 02/08/1961 Referring Provider: [name], NP (Dr. Chipper Oman at [facility])  Encounter Date: 03/04/2017      PT End of Session - 03/04/17 1251    Visit Number 3   Number of Visits 17   Date for PT Re-Evaluation 04/05/17   Authorization Type [employer] G-code   PT Start Time 1020   PT Stop Time 1101   PT Time Calculation (min) 41 min   Activity Tolerance Patient tolerated treatment well   Behavior During Therapy [facility] for tasks assessed/performed      Past Medical History:  Diagnosis Date  . Arthritis    osteo  . Asthma   . Bipolar disorder ([place]) 05/21/14  . Cataracts, bilateral   . DDD (degenerative disc disease), cervical    also back  . Depression   . Dizziness    Positional  . Edema    feet/legs  . Fibromyalgia syndrome   . Fungal infection    Finger nails  . GERD (gastroesophageal reflux disease)   . Gout   . Headache    seasonal allergies  . Heart palpitations   . 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 Hypercholesterolemia   . Multiple sclerosis (HCC)    weakness  . Nephrolithiasis    kidney stones  . Osteoporosis    osteoarthritis  . Pneumonia   . PONV (postoperative nausea and vomiting)    no problem after cataract surgery  . Psoriasis   . Renal stone   . Shortness of breath dyspnea    wheezing  . Sleep apnea 2012   sleep study / slight, no interventions  . Urinary frequency     Past Surgical History:  Procedure Laterality Date  . CATARACT EXTRACTION W/PHACO Left 05/21/2015   Procedure: CATARACT EXTRACTION PHACO AND INTRAOCULAR LENS PLACEMENT (IOC);  Surgeon: Birder Robson, MD;  Location: [facility];  Service: Ophthalmology;  Laterality: Left;  [place] 00:35 AP%  22.9 CDE 8.11 fluid pack lot #[id number] H  . CATARACT EXTRACTION W/PHACO Right 06/04/2015   Procedure: CATARACT EXTRACTION PHACO AND INTRAOCULAR LENS PLACEMENT (IOC);  Surgeon: Birder Robson, MD;  Location: [facility];  Service: Ophthalmology;  Laterality: Right;  US:00:48 AP%: 10.5 CDE:5.08 Fluid lot #[id number] H  . CYSTOSCOPY/URETEROSCOPY/HOLMIUM LASER/STENT PLACEMENT Bilateral 09/22/2016   Procedure: CYSTOSCOPY/URETEROSCOPY/HOLMIUM LASER/STENT PLACEMENT;  Surgeon: Hollice Espy, MD;  Location: [facility];  Service: Urology;  Laterality: Bilateral;  . EYE SURGERY  2015   tissue biopsy  . FOOT SURGERY  2015  . JOINT REPLACEMENT Left 2013   hip replacement  . LITHOTRIPSY    . PTOSIS REPAIR Bilateral 02/18/2016   Procedure: BILATERAL PTOSIS REPAIR UPPER EYELIDS;  Surgeon: Karle Starch, MD;  Location: [facility];  Service: Ophthalmology;  Laterality: Bilateral;  LEAVE PT EARLY AM  . thumb surgery Right   . TONSILLECTOMY  1973    There were no vitals filed for this visit.      Subjective Assessment - 03/04/17 1022    Subjective Patient reports she is having a "bad day". Patient reports having to throw out all food in the house due to loss of power, and she has eaten out (salty fast food restaurants), which is likely impacting how she feels today (particularly with the abdominal binding pressure). Patient reports mopping the floor  yesterday, and reports leg fatigue and pain today.   Pertinent History bi-polar disorder, history of L THA  2013, OA needs Right THA, gout, DDD, chronic pain/fibromyalgia, MS, lower leg nuumbness, positional dizziness, osteoporosis, kidney stones   Limitations Lifting;Standing;Walking;House hold activities   Patient Stated Goals improve walking and balance   Currently in Pain? Yes   Pain Score 7    Pain Location --  generalized pain and abdominal "binder" pressure   Pain Descriptors / Indicators Aching   Pain Onset More than a month ago   Pain Frequency  Intermittent   Pain Score 7   Pain Location Leg   Pain Orientation Right;Left   Pain Descriptors / Indicators Cramping   Pain Type Chronic pain   Pain Onset More than a month ago   Pain Frequency Intermittent   Pain Score 7   Pain Location Neck   Pain Orientation Mid   Pain Descriptors / Indicators Aching;Sore   Pain Type Chronic pain   Pain Onset More than a month ago   Pain Frequency Constant                         [facility] Adult PT Treatment/Exercise - 03/04/17 1020      Ambulation/Gait   Ambulation/Gait Yes   Ambulation/Gait Assistance 5: Supervision   Ambulation Distance (Feet) 125 Feet   Assistive device None   Gait Pattern Step-through pattern;Decreased stride length;Lateral hip instability;Decreased trunk rotation;Trunk flexed;Wide base of support   Ambulation Surface Level;Indoor     Posture/Postural Control   Posture/Postural Control Postural limitations   Postural Limitations Rounded Shoulders;Forward head;Flexed trunk     Self-Care   Self-Care Lifting;Other Self-Care Comments   Lifting patient requires cueing on sequence and technique and demonstration for proper foot placement for retrieving objects from the floor. Patient reported an immediate decrease in back and leg pain when utilizing proper mechanics.    Other Self-Care Comments  PT demonstrated and cued patient for technique, sequencing, and foot placement on completing floor transfers safety and to decrease pain in back and legs during this task. Patient requires cueing for proper sequencing and use of UE on support surface. PT and patient worked on both getting onto the floor and rising from the floor with proper, safe mechanics.     Therapeutic Activites    Therapeutic Activities ADL's   ADL's Patient required cueing and demonstration for proper technique and weight shift between feet for mopping the floor. PT utilized mop to simulate task.                 PT Education - 03/04/17  1249    Education provided Yes   Education Details ongoing HEP, proper mechanics for cleaning and floor transfers (see self care section for details), information from the [facility] for free community fitness program options   Person(s) Educated Patient   Methods Explanation;Demonstration;Verbal cues;Handout  handout given on [facility] fitness classes   Comprehension Verbalized understanding;Returned demonstration;Verbal cues required          PT Short Term Goals - 02/04/17 1308      PT SHORT TERM GOAL #1   Title Patient demonstrates & verbalizes understanding of initial HEP.  (Target Date: 03/12/2017)   Time 1   Period Months   Status New     PT SHORT TERM GOAL #2   Title Patient verbalizes energy conservation techniques for MS. (Target Date: 03/12/2017)   Time 1   Period Months  Status New     PT SHORT TERM GOAL #3   Title Patient ambulates with head turns to scan environment during gait & maintains path with supervision.  (Target Date: 03/12/2017)   Time 1   Period Months   Status New           PT Long Term Goals - 02/04/17 1310      PT LONG TERM GOAL #1   Title Patient demonstrates & verbalizes understanding of ongoing HEP / fitness plan including MS modifications.  (Target Date: 04/06/2017)   Time 2   Period Months   Status New     PT LONG TERM GOAL #2   Title Berg Balance >/= 45/56 to indicate lower fall risk.  (Target Date: 04/06/2017)   Time 2   Period Months   Status New     PT LONG TERM GOAL #3   Title Functional Gait Assessment >/= 16/30 to indicate lower fall risk.  (Target Date: 04/06/2017)   Time 2   Period Months   Status New     PT LONG TERM GOAL #4   Title Timed Up & Go <13.5 sec and cognitive TUG increases <25% to indicate lower fall risk.  (Target Date: 04/06/2017)   Time 2   Period Months   Status New               Plan - 03/04/17 1252    Clinical Impression Statement Today's skilled PT session focused on how to minimize  pain in her back and legs when cleaning and getting onto the floor. Patient required cueing for technique, and foot placement, and reported a decrease in back and leg pain during these activities (retrieving objects from floor and floor transfers) following PT session. Patient will benefit from continued skilled PT to address deficits and progress towards goals.   Rehab Potential Good   Clinical Impairments Affecting Rehab Potential bi-polar disorder, history of L THA  2013, OA needs Right THA, gout, DDD, chronic pain/fibromyalgia, MS, lower leg nuumbness, positional dizziness, osteoporosis, kidney stones   PT Frequency 2x / week   PT Duration 8 weeks   PT Treatment/Interventions ADLs/Self Care Home Management;DME Instruction;Gait training;Stair training;Functional mobility training;Therapeutic activities;Therapeutic exercise;Balance training;Neuromuscular re-education;Patient/family education   PT Next Visit Plan progress balance activities as possible; review [facility] information with patient (potential free, community classes)    Consulted and Agree with Plan of Care Patient      Patient will benefit from skilled therapeutic intervention in order to improve the following deficits and impairments:  Abnormal gait, Decreased activity tolerance, Decreased balance, Decreased cognition, Decreased coordination, Decreased endurance, Decreased knowledge of precautions, Decreased mobility, Decreased safety awareness, Decreased strength, Dizziness, Impaired flexibility, Postural dysfunction  Visit Diagnosis: Muscle weakness (generalized)  Other lack of coordination  Unsteadiness on feet  Other abnormalities of gait and mobility  Pain in left hip  Pain in right hip     Problem List Patient Active Problem List   Diagnosis Date Noted  . B12 deficiency 05/22/2016  . Dizziness 03/16/2016  . Medicare annual wellness visit, subsequent 10/25/2015  . Back pain 10/31/2014  . Bipolar disorder  ([place]) 05/21/2014  . 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 Insomnia 09/01/2011  . Obesity 05/04/2011  . HLD (hyperlipidemia) 01/20/2011  . SMOKER 01/20/2011  . Chronic pain syndrome 01/07/2011  . RENAL CALCULUS, RECURRENT 11/13/2010  . MULTIPLE SCLEROSIS, PROGRESSIVE/RELAPSING 08/28/2010  .  GERD 08/28/2010  . Osteoarthritis 08/28/2010  . NEPHROLITHIASIS, HX OF 08/28/2010    Arelia Sneddon, SPT 03/04/2017, 1:04 PM  [facility] [address] [place], [place], [address] Phone: [phone]   Fax:  [phone]  Name: Emily Moon MRN: [id number] Date of Birth: 06/17/1961

## 2017-03-09 ENCOUNTER — Ambulatory Visit: Payer: PPO | Admitting: Physical Therapy

## 2017-03-09 ENCOUNTER — Encounter: Payer: Self-pay | Admitting: Physical Therapy

## 2017-03-09 ENCOUNTER — Ambulatory Visit: Payer: PPO | Admitting: Speech Pathology

## 2017-03-09 ENCOUNTER — Ambulatory Visit: Payer: PPO | Admitting: Occupational Therapy

## 2017-03-09 DIAGNOSIS — M6281 Muscle weakness (generalized): Secondary | ICD-10-CM

## 2017-03-09 DIAGNOSIS — M25552 Pain in left hip: Secondary | ICD-10-CM

## 2017-03-09 DIAGNOSIS — R278 Other lack of coordination: Secondary | ICD-10-CM

## 2017-03-09 DIAGNOSIS — R2681 Unsteadiness on feet: Secondary | ICD-10-CM

## 2017-03-09 DIAGNOSIS — R41841 Cognitive communication deficit: Secondary | ICD-10-CM

## 2017-03-09 DIAGNOSIS — M25551 Pain in right hip: Secondary | ICD-10-CM

## 2017-03-09 DIAGNOSIS — R2689 Other abnormalities of gait and mobility: Secondary | ICD-10-CM

## 2017-03-09 NOTE — Patient Instructions (Signed)
1. Grip Strengthening (Resistive Putty)   Squeeze putty using thumb and all fingers. Repeat _20___ times. Do __2__ sessions per day.   2. Roll putty into tube on table and pinch between each finger and thumb x 10 reps each. (can do ring and small finger together)     Copyright  VHI. All rights reserved.   

## 2017-03-09 NOTE — Therapy (Signed)
[facility] [address] [place], [place], [address] Phone: [phone]   Fax:  [phone]  Speech Language Pathology Treatment  Patient Details  Name: Emily Moon MRN: [id number] Date of Birth: 1961-10-18 Referring Provider: [name], NP, Dr. Chipper Oman [facility])  Encounter Date: 03/09/2017      End of Session - 03/09/17 0941    Visit Number 4   Number of Visits 17   Date for SLP Re-Evaluation 03/09/17   SLP Start Time 0845   SLP Stop Time  0933   SLP Time Calculation (min) 48 min   Activity Tolerance Patient tolerated treatment well;No increased pain      Past Medical History:  Diagnosis Date  . Arthritis    osteo  . Asthma   . Bipolar disorder ([place]) 05/21/14  . Cataracts, bilateral   . DDD (degenerative disc disease), cervical    also back  . Depression   . Dizziness    Positional  . Edema    feet/legs  . Fibromyalgia syndrome   . Fungal infection    Finger nails  . GERD (gastroesophageal reflux disease)   . Gout   . Headache    seasonal allergies  . Heart palpitations   . 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 Hypercholesterolemia   . Multiple sclerosis (HCC)    weakness  . Nephrolithiasis    kidney stones  . Osteoporosis    osteoarthritis  . Pneumonia   . PONV (postoperative nausea and vomiting)    no problem after cataract surgery  . Psoriasis   . Renal stone   . Shortness of breath dyspnea    wheezing  . Sleep apnea 2012   sleep study / slight, no interventions  . Urinary frequency     Past Surgical History:  Procedure Laterality Date  . CATARACT EXTRACTION W/PHACO Left 05/21/2015   Procedure: CATARACT EXTRACTION PHACO AND INTRAOCULAR LENS PLACEMENT (IOC);  Surgeon: Birder Robson, MD;  Location: [facility];  Service: Ophthalmology;  Laterality: Left;  [place] 00:35 AP% 22.9 CDE 8.11 fluid pack lot #[id number] H  . CATARACT EXTRACTION W/PHACO Right 06/04/2015   Procedure: CATARACT EXTRACTION PHACO AND INTRAOCULAR LENS PLACEMENT (IOC);  Surgeon: Birder Robson, MD;  Location: [facility];  Service: Ophthalmology;  Laterality: Right;  US:00:48 AP%: 10.5 CDE:5.08 Fluid lot #[id number] H  . CYSTOSCOPY/URETEROSCOPY/HOLMIUM LASER/STENT PLACEMENT Bilateral 09/22/2016   Procedure: CYSTOSCOPY/URETEROSCOPY/HOLMIUM LASER/STENT PLACEMENT;  Surgeon: Hollice Espy, MD;  Location: [facility];  Service: Urology;  Laterality: Bilateral;  . EYE SURGERY  2015   tissue biopsy  . FOOT SURGERY  2015  . JOINT REPLACEMENT Left 2013   hip replacement  . LITHOTRIPSY    . PTOSIS REPAIR Bilateral 02/18/2016   Procedure: BILATERAL PTOSIS REPAIR UPPER EYELIDS;  Surgeon: Karle Starch, MD;  Location: [facility];  Service: Ophthalmology;  Laterality: Bilateral;  LEAVE PT EARLY AM  . thumb surgery Right   . TONSILLECTOMY  1973    There were no vitals filed for this visit.      Subjective Assessment - 03/09/17 0849    Subjective "I forgot my homework, I forgot to do it"               ADULT SLP TREATMENT - 03/09/17 0849      General Information   Behavior/Cognition Alert;Cooperative;Pleasant mood     Treatment Provided   Treatment provided Cognitive-Linquistic     Pain Assessment   Pain Assessment 0-10   Pain Score 7  Pain Location Leg, neck, back   Pain Descriptors / Indicators Constant;Aching   Pain Intervention(s) Monitored during session     Cognitive-Linquistic Treatment   Treatment focused on Cognition   Skilled Treatment Pt reporting some depression - has appt with psychaitrist. Attention to detail and alternating attention complex card sort with some conversation. Pt tearful during session with some anxiety. Complex divergent naming with 95% accuracy and mod I. Pt instructed to put reminders in her phone for ST and OT homeowork     Assessment / Recommendations / [place] with current plan of care     Progression Toward Goals    Progression toward goals Progressing toward goals          SLP Education - 03/09/17 0937    Education provided Yes   Education Details cognitive activities to do at home, use phone reminders to do OT/ST/PT exercises   Person(s) Educated Patient   Methods Explanation;Demonstration;Verbal cues;Handout   Comprehension Verbalized understanding;Returned demonstration;Verbal cues required          SLP Short Term Goals - 03/09/17 0941      SLP SHORT TERM GOAL #1   Title Pt will attend to details in mildly complex cognitive linguistic activity with 95% accuracy and rare min A   Time 2   Period Weeks   Status On-going     SLP SHORT TERM GOAL #2   Title Pt will alternate attention betwen 2 mildly complex cognitive linguistic activities with 90% on each and occasional min A   Time 2   Period Weeks   Status On-going     SLP SHORT TERM GOAL #3   Title Pt will demonstrate carryover of daily journal or notes in phone to recall pertinent daily events over 3 sessions   Time 2   Period Weeks   Status On-going     SLP SHORT TERM GOAL #4   Title Pt will demonstrate compensations for dysnomia in simple conversation with rare min A   Time 3   Status On-going          SLP Long Term Goals - 03/09/17 0941      SLP LONG TERM GOAL #1   Title Pt will carryover journaling/phone notes to recall pertinent daily information over 5 sessions with rare min A   Time 6   Period Weeks   Status On-going     SLP LONG TERM GOAL #2   Title Pt will divide attention between 2 mildly complex cognitive linguistc tasks with 85% on each and occasional min A   Time 6   Period Weeks   Status On-going     SLP LONG TERM GOAL #3   Title Pt will verbalize compensations for attention with rare min A   Time 6   Period Weeks   Status On-going          Plan - 03/09/17 2585    Clinical Impression Statement Pt alternated attention between 3 piles of card sort with occasional min A to attend to different  rules of each pile - 85% accuracy, some divided attention added in with simple conversation with occasional min A to attend to cards and conversation. Mod I with complex naming today. Pt tearful and anxious during session. Instructed to let MD know about increased depression. Pt also reporting lack of motivation to complete OT/PT/ST activities at home - Continue skilled ST to maximize cognition for independence and QOL.   Speech Therapy Frequency 1x /week  Treatment/Interventions Compensatory strategies;Patient/family education;Functional tasks;Cognitive reorganization;Multimodal communcation approach;Internal/external aids;SLP instruction and feedback;Language facilitation   Potential to Achieve Goals Fair   Potential Considerations Medical prognosis;Severity of impairments;Ability to learn/carryover information      Patient will benefit from skilled therapeutic intervention in order to improve the following deficits and impairments:   Cognitive communication deficit    Problem List Patient Active Problem List   Diagnosis Date Noted  . B12 deficiency 05/22/2016  . Dizziness 03/16/2016  . Medicare annual wellness visit, subsequent 10/25/2015  . Back pain 10/31/2014  . Bipolar disorder ([place]) 05/21/2014  . 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 Insomnia 09/01/2011  . Obesity 05/04/2011  . HLD (hyperlipidemia) 01/20/2011  . SMOKER 01/20/2011  . Chronic pain syndrome 01/07/2011  . RENAL CALCULUS, RECURRENT 11/13/2010  . MULTIPLE SCLEROSIS, PROGRESSIVE/RELAPSING 08/28/2010  . GERD 08/28/2010  . Osteoarthritis 08/28/2010  . NEPHROLITHIASIS, HX OF 08/28/2010    Lovvorn, Annye Rusk MS, CCC-SLP 03/09/2017, 9:43 AM  [facility] [address] [place], [place], [address] Phone: [phone]   Fax:  [phone]   Name:  BRITTANI PURDUM MRN: [id number] Date of Birth: [birth year]

## 2017-03-09 NOTE — Patient Instructions (Addendum)
Some examples of energy conservation strategies: 1. Appropriate rest to activity ratio  2.  Use of assistive devices to conserve energy (motorized scooters are particularly useful for ambulatory people who experience fatigue when walking)  3. Environmental modifications to make activities more energy-efficient  4. Cooling strategies to avoid the fatigue caused by elevations in core body temperature due to heat, exercise-related exertion, and fever  5. Regular aerobic exercise, geared to the person's ability, to promote cardiovascular health, strength, improved mood, and reduce fatigue  6. Stress management techniques   A different kind of tired Before learning how to beat fatigue, it's useful to understand the types of fatigue you may face when you have MS. Researchers have started to identify a number of distinct characteristics associated specifically with MS that make it quite different from garden-variety tiredness, such as: Onset: It can begin suddenly.  Frequency: It often occurs every day.  Time of day: It can occur in the morning, despite your having slept the night before.  Progression: It commonly worsens throughout the day.  Sensitivity to heat: Heat and humidity may aggravate it.  Severity: It tends to be more severe than other types of fatigue.  Effect on activities: It's more likely than regular fatigue to disrupt your ability to perform everyday tasks.  Tip 1: Exercise often According to the [facility], regular physical activity can help fight fatigue related to MS. Sticking with a consistent exercise program can help with endurance, balance, weight loss, and general well-being - all important for people living with MS.  However, there is one caveat: while exercise helps some people with MS, there are others with the condition who won't have the same benefit. If in doubt, talk to your doctor before starting any kind of new fitness program, and remember that the goal of  exercise is to give you more energy, not make you feel more tired.  Tip 2: Conserve energy Energy conservation isn't just important for the environment, it's also a key principle for those with MS.  What's your best time of day to get things done (i.e., the time when you feel the most energetic)? If you notice that you feel less fatigue in the morning, take advantage of your extra energy to take care of tasks like shopping and cleaning. Then you can conserve your energy later when you feel more fatigued, knowing you've already accomplished key tasks for the day.  Tip 3: Stay cool MS patients may be especially sensitive to heat. As a result, they may experience more fatigue when they're in a warmer environment or become overheated. Try these techniques to cool down: Use air conditioning as needed, especially in the summer months.  Wear a cooling vest.  Take a cool shower.  Jump in a swimming pool.  Drink icy beverages.  Wear lightweight clothes.  Tip 4: Try therapy If your own lifestyle changes don't give you the energy boost you need, you may want to try occupational or physical therapy.  With occupational therapy, a trained specialist helps you simplify activities in your work or home environments. This may involve using adaptive equipment or changing your environment to help increase your physical and mental energy.  With physical therapy, a trained professional helps you perform daily physical tasks more effectively. For instance, you may use techniques or devices that can help you to conserve energy while walking.  Tip 5: Regulate your sleep Sleep problems are often behind the fatigue that people with MS experience. Whether you have trouble  falling asleep, staying asleep, or getting the amount and type of sleep you need to awaken feeling refreshed, the result is the same: you'll feel tired.  To help prevent these problems, it's important to regulate your sleep. This might involve  identifying and treating other symptoms of MS that cause sleep problems - for example, urinary dysfunction. If all else fails, you might talk with your doctor about using sleep medications for a short period of time.   Tip 6: Avoid problem behaviors Certain behaviors may seem to help with fatigue, but in the end may cause more problems than they solve. While drinking a hot beverage may sound like a good way to wind down if you're having trouble sleeping, be sure to check if your drink contains caffeine. Coffee and tea typically contain caffeine, which can prevent you from falling asleep, leading to fatigue the next day. Similarly, while alcohol may help you feel sleepy after you first drink it, it can later make it harder to get a restful night's sleep. Review your behaviors that may be contributing to poor sleep habits and fatigue, and take steps to avoid them.  Tip 7: Eat right Poor nutrition can make anyone feel tired or fatigued, and the same may be even more true for people with MS. Studies show that your diet can affect your symptoms and how you feel, and may even impact the progression of your disease.  Good nutrition advice for most people includes eating lots of fruits and vegetables, lean protein, and whole grains. This advice holds true for you, too. And some tips, such as making sure you consume enough healthy fats and vitamin D, may be especially important if you have MS. If you have questions about how you should be eating, talk to your doctor. They can help advise you, or refer you to a nutritionist who can help create a healthy eating plan just for you.  Tip 8: Keep stress in check Just like a poor diet may affect a person with MS more than someone without it, stress could have a bigger impact on you than on your friend without MS.  Among other effects, anyone with stress can experience insomnia, which can lead to fatigue. But for people with MS, stress can actually worsen your  condition. Research has shown that stress could cause increased MS lesions in the brain. And advanced disease can increase your symptoms, including fatigue. Eating well, exercising, and even listening to music can help reduce stress. Meditation is also a proven way to help you relax and ease stress. For more ideas, talk to your doctor. But don't stress about it - stress is a part of everyday life, so your goal should be to simply keep it under control, not get rid of it entirely.  Tip 9: Manage your medications If you're taking medications for other symptoms, check their side effects to make sure they aren't adding to your fatigue. Talk to your doctor about each medication you take, and work together to determine whether you can stop taking those that can cause fatigue. In terms of medication to help ease fatigue, your doctor can help you decide what's right for you. While some medications including aspirin can help with fatigue management, the [facility] recommends avoiding using medications to treat tiredness. This is because as an MS patient, you may already be taking other medications, and it's best to limit the number of drugs you take when possible. However, everyone's MS symptoms are different, and if you  try the tips in this article and nothing works to manage your fatigue, there are medication options to help reduce fatigue. Amantadine and modafinil are two off-label drugs that may help. That said, they're still being studied as treatment for MS fatigue, and may not be covered by your insurance for this purpose. For more information about these drugs, talk to your doctor.   Awakening to the problem Fatigue from MS can wreak havoc on your life for many reasons, both at work and home. It may severely limit the types of activities you can do, and may even result in you having to leave your job. So, it's worth it to learn how to manage the fatigue caused by MS.  If you have questions or concerns  about your fatigue or level of energy, talk to your doctor for guidance. They'll work with you to find ways to address your fatigue and help you have more energy in your daily life.

## 2017-03-09 NOTE — Therapy (Signed)
[facility] [address] [place], [place], [address] Phone: [phone]   Fax:  [phone]  Occupational Therapy Treatment  Patient Details  Name: Emily Moon MRN: [id number] Date of Birth: 29-Mar-1961 Referring Provider: [name] NP  Encounter Date: 03/09/2017      OT End of Session - 03/09/17 0949    Visit Number 4   Number of Visits 17   Date for OT Re-Evaluation 04/04/17   Authorization Type [employer]   Authorization Time Period 60 days- recert 5/62/56-3/89/37   Authorization - Visit Number 4   Authorization - Number of Visits 10   OT Start Time 0936  2 units, pt in BR   OT Stop Time 1015   OT Time Calculation (min) 39 min   Activity Tolerance Patient tolerated treatment well   Behavior During Therapy [facility] for tasks assessed/performed;Anxious      Past Medical History:  Diagnosis Date  . Arthritis    osteo  . Asthma   . Bipolar disorder ([place]) 05/21/14  . Cataracts, bilateral   . DDD (degenerative disc disease), cervical    also back  . Depression   . Dizziness    Positional  . Edema    feet/legs  . Fibromyalgia syndrome   . Fungal infection    Finger nails  . GERD (gastroesophageal reflux disease)   . Gout   . Headache    seasonal allergies  . Heart palpitations   . 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 Hypercholesterolemia   . Multiple sclerosis (HCC)    weakness  . Nephrolithiasis    kidney stones  . Osteoporosis    osteoarthritis  . Pneumonia   . PONV (postoperative nausea and vomiting)    no problem after cataract surgery  . Psoriasis   . Renal stone   . Shortness of breath dyspnea    wheezing  . Sleep apnea 2012   sleep study / slight, no interventions  . Urinary frequency     Past Surgical History:  Procedure Laterality Date  . CATARACT EXTRACTION W/PHACO Left 05/21/2015   Procedure: CATARACT EXTRACTION PHACO AND INTRAOCULAR LENS PLACEMENT  (IOC);  Surgeon: Birder Robson, MD;  Location: [facility];  Service: Ophthalmology;  Laterality: Left;  [place] 00:35 AP% 22.9 CDE 8.11 fluid pack lot #[id number] H  . CATARACT EXTRACTION W/PHACO Right 06/04/2015   Procedure: CATARACT EXTRACTION PHACO AND INTRAOCULAR LENS PLACEMENT (IOC);  Surgeon: Birder Robson, MD;  Location: [facility];  Service: Ophthalmology;  Laterality: Right;  US:00:48 AP%: 10.5 CDE:5.08 Fluid lot #[id number] H  . CYSTOSCOPY/URETEROSCOPY/HOLMIUM LASER/STENT PLACEMENT Bilateral 09/22/2016   Procedure: CYSTOSCOPY/URETEROSCOPY/HOLMIUM LASER/STENT PLACEMENT;  Surgeon: Hollice Espy, MD;  Location: [facility];  Service: Urology;  Laterality: Bilateral;  . EYE SURGERY  2015   tissue biopsy  . FOOT SURGERY  2015  . JOINT REPLACEMENT Left 2013   hip replacement  . LITHOTRIPSY    . PTOSIS REPAIR Bilateral 02/18/2016   Procedure: BILATERAL PTOSIS REPAIR UPPER EYELIDS;  Surgeon: Karle Starch, MD;  Location: [facility];  Service: Ophthalmology;  Laterality: Bilateral;  LEAVE PT EARLY AM  . thumb surgery Right   . TONSILLECTOMY  1973    There were no vitals filed for this visit.      Subjective Assessment - 03/09/17 0938    Subjective  Pt reports "I'm having pain in neck 8/10, low back 8/10, legs 7/10 and MS hug 9/10 nd I didn't even want to come today"   Pertinent  History MS,bi-polar disorder, history of L THA  2013, OA needs Right THA, gout, DDD, chronic pain/fibromyalgia,  lower leg nuumbness, positional dizziness, osteoporosis, kidney stones   Patient Stated Goals maintain her independence   Currently in Pain? Yes   Pain Score 8    Pain Location --  legs and feet   Pain Descriptors / Indicators Aching   Pain Type Chronic pain   Pain Onset More than a month ago   Pain Frequency Intermittent   Pain Relieving Factors rest , meds   Effect of Pain on Daily Activities limits daily activities   Multiple Pain Sites No             copying small peg design with left  and right UE's for increased fine motor coordination, min v.c Handwriting using larger grip, pt demonstrates good legibility and letter size.                 OT Education - 03/09/17 1001    Education provided Yes   Education Details reveiwed coordination HEP, issued putty HEP   Person(s) Educated Patient   Methods Explanation;Demonstration;Verbal cues;Handout   Comprehension Verbalized understanding;Returned demonstration;Verbal cues required          OT Short Term Goals - 02/23/17 1651      OT SHORT TERM GOAL #1   Title I with HEP. due 03/24/17   Time 4   Period Weeks   Status New     OT SHORT TERM GOAL #2   Title Pt will verbalize understanding of adapted strategies(including AE/DME)/ energy conservation techniques to maximize safety and independence with ADLS/IADLs.   Time 4   Period Weeks   Status New     OT SHORT TERM GOAL #3   Title Pt will demonstrate ability to write a short paragraph with 100% legibility and no significant decrease in letter size.   Time 4   Period Weeks   Status New           OT Long Term Goals - 02/23/17 1652      OT LONG TERM GOAL #1   Title Pt will demonstrate improved standing balance for ADLS as evidenced by improving bilateral standing functional reach to 10 inches or greater. due 04/23/17   Baseline RUE 7 inches, LUE 9 inches   Time 8   Period Weeks   Status New     OT LONG TERM GOAL #2   Title Pt will demonstrate ability to retrieve a lightweight object at 130 shoulder flexion with RUE.   Time 8   Period Weeks   Status New     OT LONG TERM GOAL #3   Title Pt will verbalize understanding of ways to compensate for short term memory deficits.   Time 8   Period Weeks   Status New               Plan - 03/09/17 0950    Clinical Impression Statement Pt is progressing towards goals. Therapsit reveiwed coordination HEP as pt has not been performing recently.   Rehab Potential Fair   OT Frequency 2x / week    OT Duration 8 weeks   OT Treatment/Interventions Self-care/ADL training;DME and/or AE instruction;Patient/family education;Balance training;Therapeutic exercises;Ultrasound;Therapeutic exercise;Therapeutic activities;Cognitive remediation/compensation;Passive range of motion;Functional Mobility Training;Neuromuscular education;Cryotherapy;Electrical Stimulation;Parrafin;Energy conservation;Manual Therapy;Visual/perceptual remediation/compensation   Plan continue to work towards unmet goals   Consulted and Agree with Plan of Care Patient      Patient will benefit from skilled therapeutic intervention in order  to improve the following deficits and impairments:  Abnormal gait, Decreased cognition, Impaired flexibility, Pain, Impaired sensation, Decreased mobility, Decreased coordination, Decreased activity tolerance, Decreased endurance, Decreased range of motion, Decreased strength, Impaired tone, Impaired UE functional use, Difficulty walking, Decreased safety awareness, Decreased knowledge of precautions, Decreased balance  Visit Diagnosis: Muscle weakness (generalized)  Other lack of coordination    Problem List Patient Active Problem List   Diagnosis Date Noted  . B12 deficiency 05/22/2016  . Dizziness 03/16/2016  . Medicare annual wellness visit, subsequent 10/25/2015  . Back pain 10/31/2014  . Bipolar disorder ([place]) 05/21/2014  . 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 Insomnia 09/01/2011  . Obesity 05/04/2011  . HLD (hyperlipidemia) 01/20/2011  . SMOKER 01/20/2011  . Chronic pain syndrome 01/07/2011  . RENAL CALCULUS, RECURRENT 11/13/2010  . MULTIPLE SCLEROSIS, PROGRESSIVE/RELAPSING 08/28/2010  . GERD 08/28/2010  . Osteoarthritis 08/28/2010  . NEPHROLITHIASIS, HX OF 08/28/2010    RINE,KATHRYN 03/09/2017, 10:12 AM Theone Murdoch, OTR/L [phone] Phone: [phone] 10:14 AM 03/09/17 [facility] [address] [place], [place], [address] Phone: [phone]   Fax:  [phone]  Name: Emily Moon MRN: [id number] Date of Birth: 1960-11-28

## 2017-03-09 NOTE — Patient Instructions (Addendum)
  Use phone as reminder to do ST/OT/PT exercises  Do cognitive activitie daily   Cognitive Activities you can do at home:   - Solitaire  - [place] (easy level)  - [place]  On your computer, tablet or phone: [email or web address]ch[employer], [employer]

## 2017-03-09 NOTE — Therapy (Signed)
[facility] [address] [place], [place], [address] Phone: [phone]   Fax:  [phone]  Physical Therapy Treatment  Patient Details  Name: Emily Moon MRN: [id number] Date of Birth: [birth year] Referring Provider: [name], NP (Dr. Chipper Oman at [facility])  Encounter Date: 03/09/2017      PT End of Session - 03/09/17 1020    Visit Number 4   Number of Visits 17   Date for PT Re-Evaluation 04/05/17   Authorization Type [employer] G-code   PT Start Time 1017   PT Stop Time 1100   PT Time Calculation (min) 43 min   Equipment Utilized During Treatment Gait belt   Activity Tolerance Patient tolerated treatment well   Behavior During Therapy [facility] for tasks assessed/performed      Past Medical History:  Diagnosis Date  . Arthritis    osteo  . Asthma   . Bipolar disorder ([place]) 05/21/14  . Cataracts, bilateral   . DDD (degenerative disc disease), cervical    also back  . Depression   . Dizziness    Positional  . Edema    feet/legs  . Fibromyalgia syndrome   . Fungal infection    Finger nails  . GERD (gastroesophageal reflux disease)   . Gout   . Headache    seasonal allergies  . Heart palpitations   . 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 Hypercholesterolemia   . Multiple sclerosis (HCC)    weakness  . Nephrolithiasis    kidney stones  . Osteoporosis    osteoarthritis  . Pneumonia   . PONV (postoperative nausea and vomiting)    no problem after cataract surgery  . Psoriasis   . Renal stone   . Shortness of breath dyspnea    wheezing  . Sleep apnea 2012   sleep study / slight, no interventions  . Urinary frequency     Past Surgical History:  Procedure Laterality Date  . CATARACT EXTRACTION W/PHACO Left 05/21/2015   Procedure: CATARACT EXTRACTION PHACO AND INTRAOCULAR LENS PLACEMENT (IOC);  Surgeon: Birder Robson, MD;  Location: [facility] ORS;  Service:  Ophthalmology;  Laterality: Left;  [place] 00:35 AP% 22.9 CDE 8.11 fluid pack lot #[id number] H  . CATARACT EXTRACTION W/PHACO Right 06/04/2015   Procedure: CATARACT EXTRACTION PHACO AND INTRAOCULAR LENS PLACEMENT (IOC);  Surgeon: Birder Robson, MD;  Location: [facility];  Service: Ophthalmology;  Laterality: Right;  US:00:48 AP%: 10.5 CDE:5.08 Fluid lot #[id number] H  . CYSTOSCOPY/URETEROSCOPY/HOLMIUM LASER/STENT PLACEMENT Bilateral 09/22/2016   Procedure: CYSTOSCOPY/URETEROSCOPY/HOLMIUM LASER/STENT PLACEMENT;  Surgeon: Hollice Espy, MD;  Location: [facility] ORS;  Service: Urology;  Laterality: Bilateral;  . EYE SURGERY  2015   tissue biopsy  . FOOT SURGERY  2015  . JOINT REPLACEMENT Left 2013   hip replacement  . LITHOTRIPSY    . PTOSIS REPAIR Bilateral 02/18/2016   Procedure: BILATERAL PTOSIS REPAIR UPPER EYELIDS;  Surgeon: Karle Starch, MD;  Location: [place];  Service: Ophthalmology;  Laterality: Bilateral;  LEAVE PT EARLY AM  . thumb surgery Right   . TONSILLECTOMY  1973    There were no vitals filed for this visit.      Subjective Assessment - 03/09/17 1017    Subjective No new complaints. No new falls, did have a near fall with moving a bag of mulch, however caught herself. No changes in pain, chronic in nature.   Pertinent History bi-polar disorder, history of L THA  2013, OA needs Right THA, gout,  DDD, chronic pain/fibromyalgia, MS, lower leg nuumbness, positional dizziness, osteoporosis, kidney stones   Limitations Lifting;Standing;Walking;House hold activities   Patient Stated Goals improve walking and balance   Currently in Pain? Yes   Pain Score 8    Pain Location Generalized  neck, legs and shoulders   Pain Descriptors / Indicators Aching;Sore   Pain Type Chronic pain   Pain Onset More than a month ago   Pain Frequency Intermittent   Aggravating Factors  moving wrong, bad weather   Pain Relieving Factors rest, meds            [facility] Adult PT Treatment/Exercise -  03/09/17 1036      Transfers   Transfers Sit to Stand;Stand to Sit   Sit to Stand 6: Modified independent (Device/Increase time);With upper extremity assist;From chair/3-in-1   Stand to Sit 6: Modified independent (Device/Increase time);With upper extremity assist;To chair/3-in-1     Ambulation/Gait   Ambulation/Gait Yes   Ambulation/Gait Assistance 5: Supervision;4: Min guard   Assistive device None   Gait Pattern Step-through pattern;Decreased stride length;Lateral hip instability;Decreased trunk rotation;Trunk flexed;Wide base of support   Ambulation Surface Level;Indoor   Gait Comments gait along ~50 foot hallway: forward gait with head turns left<>right and up<>down x 4 laps each with min guard to min assist for balance. decrease gait speed with veering noted with lateral head movements, decr gait speed only with vertical head movemtns.              Balance Exercises - 03/09/17 1045      Balance Exercises: Standing   Standing Eyes Closed Wide (BOA);Head turns;Foam/compliant surface;Other reps (comment);30 secs;Limitations;Narrow base of support (BOS)     Balance Exercises: Standing   Standing Eyes Closed Limitations on pillows in corner with chair in front for safety: wide base of support: EC no head movements, then EC with head movements left<>right, up<>down and diagonals both ways, progressing to narrow base of support to perform the same tasks with light finger tip support for balance           PT Education - 03/09/17 2055    Education provided Yes   Education Details energy conservation techniques/strategies    Person(s) Educated Patient   Methods Explanation;Demonstration;Verbal cues;Handout   Comprehension Verbalized understanding;Returned demonstration;Verbal cues required;Need further instruction          PT Short Term Goals - 03/09/17 1020      PT SHORT TERM GOAL #1   Title Patient demonstrates & verbalizes understanding of initial HEP.  (Target Date:  03/12/2017)   Baseline 03/09/17: doing the counter top ex's, not the corner due to lack of space-problem solved this to in kitchen, closing bedroom door   Status Achieved     PT SHORT TERM GOAL #2   Title Patient verbalizes energy conservation techniques for MS. (Target Date: 03/12/2017)   Baseline 03/09/17: provided information today   Time --   Period --   Status Partially Met     PT SHORT TERM GOAL #3   Title Patient ambulates with head turns to scan environment during gait & maintains path with supervision.  (Target Date: 03/12/2017)   Baseline 03/09/17: met today   Status Achieved           PT Long Term Goals - 03/04/17 1758      PT LONG TERM GOAL #1   Title Patient demonstrates & verbalizes understanding of ongoing HEP / fitness plan including MS modifications.  (Target Date: 04/06/2017)   Time 2  Period Months   Status On-going     PT LONG TERM GOAL #2   Title Berg Balance >/= 45/56 to indicate lower fall risk.  (Target Date: 04/06/2017)   Time 2   Period Months   Status On-going     PT LONG TERM GOAL #3   Title Functional Gait Assessment >/= 16/30 to indicate lower fall risk.  (Target Date: 04/06/2017)   Time 2   Period Months   Status On-going     PT LONG TERM GOAL #4   Title Timed Up & Go <13.5 sec and cognitive TUG increases <25% to indicate lower fall risk.  (Target Date: 04/06/2017)   Time 2   Period Months   Status On-going     PT LONG TERM GOAL #5   Status On-going            Plan - 03/09/17 1020    Clinical Impression Statement Today's skilled session focused on progress toward STGs with 2 met and 1 partially met. Remainder of session addressed balance with no significant issues reported. Pt did report mild increase in back pain that was relieved with seated rest breaks. Pt is making steady progress toward goals and should benefit from continued PT to progress toward LTGs.                                           Rehab Potential Good   Clinical  Impairments Affecting Rehab Potential bi-polar disorder, history of L THA  2013, OA needs Right THA, gout, DDD, chronic pain/fibromyalgia, MS, lower leg nuumbness, positional dizziness, osteoporosis, kidney stones   PT Frequency 2x / week   PT Duration 8 weeks   PT Treatment/Interventions ADLs/Self Care Home Management;DME Instruction;Gait training;Stair training;Functional mobility training;Therapeutic activities;Therapeutic exercise;Balance training;Neuromuscular re-education;Patient/family education   PT Next Visit Plan continued to work on dynamic gait and high level balance activities   Consulted and Agree with Plan of Care Patient      Patient will benefit from skilled therapeutic intervention in order to improve the following deficits and impairments:  Abnormal gait, Decreased activity tolerance, Decreased balance, Decreased cognition, Decreased coordination, Decreased endurance, Decreased knowledge of precautions, Decreased mobility, Decreased safety awareness, Decreased strength, Dizziness, Impaired flexibility, Postural dysfunction  Visit Diagnosis: Muscle weakness (generalized)  Unsteadiness on feet  Other abnormalities of gait and mobility  Pain in left hip  Pain in right hip     Problem List Patient Active Problem List   Diagnosis Date Noted  . B12 deficiency 05/22/2016  . Dizziness 03/16/2016  . Medicare annual wellness visit, subsequent 10/25/2015  . Back pain 10/31/2014  . Bipolar disorder ([place]) 05/21/2014  . 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 Insomnia 09/01/2011  . Obesity 05/04/2011  . HLD (hyperlipidemia) 01/20/2011  . SMOKER 01/20/2011  . Chronic pain syndrome 01/07/2011  . RENAL CALCULUS, RECURRENT 11/13/2010  . MULTIPLE SCLEROSIS, PROGRESSIVE/RELAPSING 08/28/2010  . GERD 08/28/2010  . Osteoarthritis 08/28/2010  . NEPHROLITHIASIS, HX OF  08/28/2010    Willow Ora, PTA, [place] [address], [place], [place] [address] [phone] 03/09/17, 9:03 PM   Name: JANARIA MCCAMMON MRN: [id number] Date of Birth: 1961-01-10

## 2017-03-10 ENCOUNTER — Encounter (HOSPITAL_COMMUNITY): Payer: Self-pay | Admitting: Psychiatry

## 2017-03-10 ENCOUNTER — Ambulatory Visit (INDEPENDENT_AMBULATORY_CARE_PROVIDER_SITE_OTHER): Payer: PPO | Admitting: Psychiatry

## 2017-03-10 VITALS — BP 108/64 | HR 88 | Ht 69.0 in | Wt 212.0 lb

## 2017-03-10 DIAGNOSIS — F3110 Bipolar disorder, current episode manic without psychotic features, unspecified: Secondary | ICD-10-CM | POA: Diagnosis not present

## 2017-03-10 DIAGNOSIS — Z87891 Personal history of nicotine dependence: Secondary | ICD-10-CM

## 2017-03-10 DIAGNOSIS — Z79899 Other long term (current) drug therapy: Secondary | ICD-10-CM

## 2017-03-10 DIAGNOSIS — Z7982 Long term (current) use of aspirin: Secondary | ICD-10-CM

## 2017-03-10 MED ORDER — HYDROXYZINE PAMOATE 50 MG PO CAPS: 100.0000 mg | ORAL_CAPSULE | Freq: Every day | ORAL | 5 refills | Status: DC

## 2017-03-10 MED ORDER — TEMAZEPAM 30 MG PO CAPS: ORAL_CAPSULE | ORAL | 2 refills | Status: DC

## 2017-03-10 MED ORDER — ZOLPIDEM TARTRATE 10 MG PO TABS: 10.0000 mg | ORAL_TABLET | Freq: Every day | ORAL | 0 refills | Status: DC

## 2017-03-10 MED ORDER — DULOXETINE HCL 20 MG PO CPEP: 40.0000 mg | ORAL_CAPSULE | Freq: Every day | ORAL | 4 refills | Status: DC

## 2017-03-10 MED ORDER — LAMOTRIGINE 150 MG PO TABS: 300.0000 mg | ORAL_TABLET | Freq: Every day | ORAL | 5 refills | Status: DC

## 2017-03-10 NOTE — Progress Notes (Signed)
Psychiatric Initial Adult Assessment   Patient Identification: Emily Moon MRN:  [id number] Date of Evaluation:  03/10/2017 Referral Source: From the community Chief Complaint:   Chief Complaint    Follow-up     Visit Diagnos  bipolar disorder Today the patient feels are. She was started on Crestor. She started about a month ago but over the last few weeks she feels very depressed has a lot of muscle pain. She feels bloated. She's planning to discontinue the Crestor and look for different agent. At this time the patient describes being depressed irritable with little patience. She still able to watch television and get on the computer. She still drives without problems. For the most part she sleeping fairly well. She's got a good appetite. Patient denies use of alcohol or drugs. The patient is active and occupational physical and speech therapy. These are all things that think related to her neurological condition multiple sclerosis.. I'm not clear she's actually had an exacerbation.at this time she taking all the medications prescribed. Early enjoyment seems to be television but she's being into action comic books. She loves the Avengers and superheroes. Overall the patient seems no different than she was in her last visit.  Associated Signs/Symptoms: Depression Symptoms:  hopelessness, (Hypo) Manic Symptoms:   Anxiety Symptoms:   Psychotic Symptoms:   PTSD Symptoms:   Past Psychiatric History: The patient was seen in a psychiatric office and takes multiple medications she's not been recently hospitalized.  Previous Psychotropic Medications: Yes   Substance Abuse History in the last 12 months:  No.  Consequences of Substance Abuse: Negative  Past Medical History:  Past Medical History:  Diagnosis Date  . Arthritis    osteo  . Asthma   . Bipolar disorder ([place]) 05/21/14  . Cataracts, bilateral   . DDD (degenerative disc disease), cervical    also back  . Depression   .  Dizziness    Positional  . Edema    feet/legs  . Fibromyalgia syndrome   . Fungal infection    Finger nails  . GERD (gastroesophageal reflux disease)   . Gout   . Headache    seasonal allergies  . Heart palpitations   . 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 Hypercholesterolemia   . Multiple sclerosis (HCC)    weakness  . Nephrolithiasis    kidney stones  . Osteoporosis    osteoarthritis  . Pneumonia   . PONV (postoperative nausea and vomiting)    no problem after cataract surgery  . Psoriasis   . Renal stone   . Shortness of breath dyspnea    wheezing  . Sleep apnea 2012   sleep study / slight, no interventions  . Urinary frequency     Past Surgical History:  Procedure Laterality Date  . CATARACT EXTRACTION W/PHACO Left 05/21/2015   Procedure: CATARACT EXTRACTION PHACO AND INTRAOCULAR LENS PLACEMENT (IOC);  Surgeon: Birder Robson, MD;  Location: [facility];  Service: Ophthalmology;  Laterality: Left;  [place] 00:35 AP% 22.9 CDE 8.11 fluid pack lot #[id number] H  . CATARACT EXTRACTION W/PHACO Right 06/04/2015   Procedure: CATARACT EXTRACTION PHACO AND INTRAOCULAR LENS PLACEMENT (IOC);  Surgeon: Birder Robson, MD;  Location: [facility];  Service: Ophthalmology;  Laterality: Right;  US:00:48 AP%: 10.5 CDE:5.08 Fluid lot #[id number] H  . CYSTOSCOPY/URETEROSCOPY/HOLMIUM LASER/STENT PLACEMENT Bilateral 09/22/2016   Procedure: CYSTOSCOPY/URETEROSCOPY/HOLMIUM LASER/STENT PLACEMENT;  Surgeon: Hollice Espy, MD;  Location: [facility];  Service: Urology;  Laterality: Bilateral;  . EYE SURGERY  2015   tissue biopsy  .  FOOT SURGERY  2015  . JOINT REPLACEMENT Left 2013   hip replacement  . LITHOTRIPSY    . PTOSIS REPAIR Bilateral 02/18/2016   Procedure: BILATERAL PTOSIS REPAIR UPPER EYELIDS;  Surgeon: Karle Starch, MD;  Location: [place];  Service: Ophthalmology;  Laterality: Bilateral;  LEAVE PT EARLY AM  . thumb surgery Right   . TONSILLECTOMY  1973    Family Psychiatric  History:   Family History:  Family History  Problem Relation Age of Onset  . Cancer Father     Abdomen with mastasis  . Cancer Mother   . Heart disease Mother   . Kidney disease Neg Hx   . Bladder Cancer Neg Hx   . Prostate cancer Neg Hx     Social History:   Social History   Social History  . Marital status: Divorced    Spouse name: N/A  . Number of children: 1  . Years of education: N/A   Occupational History  . 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 Smoking status: Former Smoker    Years: 25.00    Types: Cigarettes    Quit date: 11/16/2012  . Smokeless tobacco: Never Used     Comment: occasional use  . Alcohol use No  . Drug use: Yes    Types: Methylphenidate  . Sexual activity: No   Other Topics Concern  . None   Social History Narrative  . None    Additional Social History:   Allergies:   Allergies  Allergen Reactions  . Albuterol Shortness Of Breath and Other (See Comments)    Makes pt feel jittery/ tacycardic  . Crestor [Rosuvastatin] Other (See Comments)    Joint pain, muscle pain, and hair loss  . Halcion [Triazolam] Other (See Comments)    Dizziness,headaches,bladder problems  . Levaquin [Levofloxacin In D5w] Diarrhea and Itching    Shoulder pain  . Naproxen Sodium Swelling    Patient tolerates in small doses  . Tylenol [Acetaminophen] Swelling    Patient tolerates in small doses  . Cefaclor Other (See Comments)    Doesn't remember---unsure if actually allergic   . Diclofenac Sodium Other (See Comments)    "made very sick"  . Sulfa Antibiotics Itching    Unsure of reaction possibly itching  . Tramadol Itching and Nausea And Vomiting  . Aripiprazole Other (See Comments)    Muscle tension/cramping  . Ibuprofen Swelling    Patient tolerates in small doses    Metabolic Disorder Labs: Lab Results  Component Value Date   HGBA1C 6.0 12/17/2016   No results found for: PROLACTIN Lab Results  Component  Value Date   CHOL 341 (H) 12/17/2016   TRIG 302.0 (H) 12/17/2016   HDL 48.70 12/17/2016   CHOLHDL 7 12/17/2016   VLDL 60.4 (H) 12/17/2016   LDLCALC 223 (H) 11/13/2014     Current Medications: Current Outpatient Prescriptions  Medication Sig Dispense Refill  . ASPERCREME LIDOCAINE EX Apply 1 application topically 4 (four) times daily as needed (for pain.).     Marland Kitchen aspirin 325 MG tablet Take 325 mg by mouth every 4 (four) hours as needed for headache.     . Biotin 10000 MCG TABS Take 1 tablet by mouth daily.    . calcipotriene-betamethasone (TACLONEX) ointment Apply 1 application topically daily as needed (for psorasis).     . carboxymethylcellulose (REFRESH TEARS) 0.5 % SOLN Place 1-2 drops into both eyes 3 (three) times daily as  needed.    . Cholecalciferol (VITAMIN D3) 10000 units capsule Take 10,000 Units by mouth daily.    . clobetasol cream (TEMOVATE) 3.41 % Apply 1 application topically 2 (two) times daily. 30 g 2  . cyanocobalamin (,VITAMIN B-12,) 1000 MCG/ML injection Inject 1,000 mcg into the muscle every 8 (eight) weeks. Every other month    . diclofenac sodium (VOLTAREN) 1 % GEL Apply 4 g topically 4 (four) times daily. (Patient taking differently: Apply 4 g topically 4 (four) times daily as needed (for pain.). ) 500 g 5  . docusate sodium (COLACE) 100 MG capsule Take 1 capsule (100 mg total) by mouth 2 (two) times daily. (Patient taking differently: Take 100 mg by mouth 2 (two) times daily as needed. ) 60 capsule 0  . DULoxetine (CYMBALTA) 20 MG capsule Take 2 capsules (40 mg total) by mouth at bedtime. 60 capsule 4  . HYDROcodone-acetaminophen (NORCO) 10-325 MG tablet Take 1 tablet by mouth every 6 (six) hours as needed for severe pain. 120 tablet 0  . HYDROcodone-acetaminophen (NORCO) 10-325 MG tablet Take 1 tablet by mouth every 6 (six) hours as needed for moderate pain or severe pain. 120 tablet 0  . HYDROcodone-acetaminophen (NORCO) 10-325 MG tablet Take 1 tablet by mouth  every 6 (six) hours as needed for moderate pain or severe pain. 120 tablet 0  . hydroquinone 4 % cream 1 APPLICATION TOPICALLY DAILY AS NEEDED FOR BLEMISHES.  3  . hydrOXYzine (VISTARIL) 50 MG capsule Take 2 capsules (100 mg total) by mouth at bedtime. 90 capsule 5  . ketorolac (TORADOL) 10 MG tablet Take 1 tablet (10 mg total) by mouth 2 (two) times daily as needed for severe pain. 60 tablet 0  . lamoTRIgine (LAMICTAL) 150 MG tablet Take 2 tablets (300 mg total) by mouth at bedtime. 60 tablet 5  . levalbuterol (XOPENEX HFA) 45 MCG/ACT inhaler Inhale 1-2 puffs into the lungs every 6 (six) hours as needed for wheezing. 1 Inhaler 5  . Lysine 1000 MG TABS Take 2,000-4,000 mg by mouth at bedtime. 2000 mg scheduled at bedtime and patient will take 4000 mg if she has outbreak    . mirabegron ER (MYRBETRIQ) 25 MG TB24 tablet Take 1 tablet (25 mg total) by mouth daily. 30 tablet 12  . neomycin-bacitracin-polymyxin (NEOSPORIN) [phone] ointment Apply 1 application topically 4 (four) times daily as needed (for cut/scrapes.).    Marland Kitchen nicotine (NICODERM CQ - DOSED IN MG/24 HOURS) 21 mg/24hr patch Place 1 patch (21 mg total) onto the skin daily. 28 patch 3  . ondansetron (ZOFRAN ODT) 4 MG disintegrating tablet Take 1 tablet (4 mg total) by mouth every 8 (eight) hours as needed for nausea. 12 tablet 0  . phenazopyridine (AZO-TABS) 95 MG tablet Take 95 mg by mouth 3 (three) times daily as needed for pain.    . polyethylene glycol powder (GLYCOLAX/MIRALAX) powder MIX 17 GRAMS (1 CAPFUL) WITH 4-8 OZ OF LIQUID AND TAKE BY MOUTH TWICE DAILY AS NEEDED (Patient taking differently: MIX 17 GRAMS (1 CAPFUL) WITH 4-8 OZ OF LIQUID AND TAKE BY MOUTH TWICE DAILY AS NEEDED FOR CONSTIPATION) 527 g 0  . rosuvastatin (CRESTOR) 5 MG tablet Take 1 tablet by mouth every other day for cholesterol. 45 tablet 1  . tamsulosin (FLOMAX) 0.4 MG CAPS capsule TAKE 1 CAPSULE (0.4 MG TOTAL) BY MOUTH DAILY. 30 capsule 0  . temazepam (RESTORIL) 30  MG capsule TAKE ONE TO TWO CAPSULES BY MOUTH NIGHTLY AT BEDTIME 60 capsule 2  .  tiZANidine (ZANAFLEX) 4 MG tablet Take 2 tablets (8 mg total) by mouth every 8 (eight) hours as needed for muscle spasms. 180 tablet 5  . zolpidem (AMBIEN) 10 MG tablet Take 1 tablet (10 mg total) by mouth at bedtime. 135 tablet 0   Current Facility-Administered Medications  Medication Dose Route Frequency Provider Last Rate Last Dose  . cyanocobalamin ((VITAMIN B-12)) injection 1,000 mcg  1,000 mcg Intramuscular Q30 days [name], NP   1,000 mcg at 12/25/16 1602  . cyanocobalamin ((VITAMIN B-12)) injection 1,000 mcg  1,000 mcg Intramuscular Once [name], NP        Neurologic: Headache: No Seizure: No Paresthesias:No  Musculoskeletal: Strength & Muscle Tone: within normal limits Gait & Station: normal Patient leans: N/A  Psychiatric Specialty Exam: ROS  Blood pressure 108/64, pulse 88, height 5\' 9"  (1.753 m), weight 212 lb (96.2 kg), last menstrual period 08/23/2014, SpO2 96 %.Body mass index is 31.31 kg/m.  General Appearance: Casual  Eye Contact:  Good  Speech:  Clear and Coherent  Volume:  Normal  Mood:  Negative  Affect:  Appropriate  Thought Process:  Goal Directed  Orientation:  NA  Thought Content:  Logical  Suicidal Thoughts:  No  Homicidal Thoughts:  No  Memory:  Negative  Judgement:  Good  Insight:  Good  Psychomotor Activity:  Normal  Concentration:    Recall:    Fund of Knowledge:Good  Language: Good  Akathisia:  No  Handed:  Right  AIMS (if indicated):    Assets:  Desire for Improvement  ADL's:  Intact  Cognition: WNL  Sleep:      Treatment Plan Summary: At this time the patient will continue taking Lamictal 150 mg taking 2 of them every night. Patient will continue with Cymbalta 20 mg 2 a day. At first we thought about increasing the dose but it is better for her to see how she feels in a few months off of Crestor. She'll continue taking Ambien 10 mg at  night. [place] self pay for extra Ambien try to take 15 mg every day. Patient continue taking Restoril two at night. Overall the patient seems to be relatively stable.Jerral Ralph, MD 4/25/20184:08 PM

## 2017-03-11 ENCOUNTER — Ambulatory Visit: Payer: Self-pay | Admitting: Physical Therapy

## 2017-03-11 ENCOUNTER — Encounter: Payer: Self-pay | Admitting: Speech Pathology

## 2017-03-11 ENCOUNTER — Encounter: Payer: Self-pay | Admitting: Occupational Therapy

## 2017-03-16 ENCOUNTER — Ambulatory Visit: Payer: PPO | Admitting: Speech Pathology

## 2017-03-16 ENCOUNTER — Encounter: Payer: Self-pay | Admitting: Physical Therapy

## 2017-03-16 ENCOUNTER — Ambulatory Visit: Payer: PPO | Admitting: Occupational Therapy

## 2017-03-16 ENCOUNTER — Ambulatory Visit: Payer: PPO | Attending: Primary Care | Admitting: Physical Therapy

## 2017-03-16 DIAGNOSIS — M25551 Pain in right hip: Secondary | ICD-10-CM | POA: Diagnosis not present

## 2017-03-16 DIAGNOSIS — R278 Other lack of coordination: Secondary | ICD-10-CM | POA: Insufficient documentation

## 2017-03-16 DIAGNOSIS — R279 Unspecified lack of coordination: Secondary | ICD-10-CM | POA: Insufficient documentation

## 2017-03-16 DIAGNOSIS — R41844 Frontal lobe and executive function deficit: Secondary | ICD-10-CM | POA: Insufficient documentation

## 2017-03-16 DIAGNOSIS — M6281 Muscle weakness (generalized): Secondary | ICD-10-CM | POA: Diagnosis not present

## 2017-03-16 DIAGNOSIS — R2681 Unsteadiness on feet: Secondary | ICD-10-CM

## 2017-03-16 DIAGNOSIS — G8929 Other chronic pain: Secondary | ICD-10-CM | POA: Insufficient documentation

## 2017-03-16 DIAGNOSIS — R41841 Cognitive communication deficit: Secondary | ICD-10-CM | POA: Diagnosis not present

## 2017-03-16 DIAGNOSIS — M545 Low back pain: Secondary | ICD-10-CM | POA: Diagnosis not present

## 2017-03-16 DIAGNOSIS — M25552 Pain in left hip: Secondary | ICD-10-CM | POA: Diagnosis not present

## 2017-03-16 DIAGNOSIS — R2689 Other abnormalities of gait and mobility: Secondary | ICD-10-CM

## 2017-03-16 NOTE — Patient Instructions (Signed)
  Strengthening: Resisted Flexion   Sit and Hold tubing with _left ____ arm(s) at side. Pull forward and up. Move shoulder through pain-free range of motion. Only perform within small range  Repeat __10__ times per set.  Do _1-2_ sessions per day , every other day   Strengthening: Resisted Extension   Perform seatedHold tubing in __one ___ hand(s), arm forward. Pull arm back, elbow straight. Repeat _10___ times per set. Do _1-2___ sessions per day, every other day.   Resisted Horizontal Abduction: Bilateral   Sit  tubing in both hands, arms out in front. Keeping arms straight, pinch shoulder blades together and stretch arms out. Repeat _10___ times per set. Do _1-2___ sessions per day, every other day.   Elbow Flexion: Resisted   With tubing held in one hand(s) and other end secured under foot, curl arm up as far as possible. Repeat _10___ times per set. Do _1-2___ sessions per day, every other day.    Elbow Extension: Resisted   Sit in chair with resistive band secured at armrest (or hold with other hand) and ___one ____ elbow bent. Straighten elbow. Repeat _10___ times per set.  Do _1-2___ sessions per day, every other day.   Copyright  VHI. All rights reserved.

## 2017-03-16 NOTE — Therapy (Signed)
[facility] [address] [place], [place], [address] Phone: [phone]   Fax:  [phone]  Occupational Therapy Treatment  Patient Details  Name: Emily Moon MRN: [id number] Date of Birth: Jun 08, 1961 Referring Provider: [name] NP  Encounter Date: 03/16/2017      OT End of Session - 03/16/17 1649    Visit Number 5   Number of Visits 17   Date for OT Re-Evaluation 04/04/17   Authorization Type [employer]   Authorization Time Period 60 days- recert 0/73/71-0/62/69   Authorization - Visit Number 5   Authorization - Number of Visits 10   OT Start Time 1325  pt in BR   OT Stop Time 1400   OT Time Calculation (min) 35 min   Activity Tolerance Patient tolerated treatment well   Behavior During Therapy [facility] for tasks assessed/performed      Past Medical History:  Diagnosis Date  . Arthritis    osteo  . Asthma   . Bipolar disorder ([place]) 05/21/14  . Cataracts, bilateral   . DDD (degenerative disc disease), cervical    also back  . Depression   . Dizziness    Positional  . Edema    feet/legs  . Fibromyalgia syndrome   . Fungal infection    Finger nails  . GERD (gastroesophageal reflux disease)   . Gout   . Headache    seasonal allergies  . Heart palpitations   . 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 Hypercholesterolemia   . Multiple sclerosis (HCC)    weakness  . Nephrolithiasis    kidney stones  . Osteoporosis    osteoarthritis  . Pneumonia   . PONV (postoperative nausea and vomiting)    no problem after cataract surgery  . Psoriasis   . Renal stone   . Shortness of breath dyspnea    wheezing  . Sleep apnea 2012   sleep study / slight, no interventions  . Urinary frequency     Past Surgical History:  Procedure Laterality Date  . CATARACT EXTRACTION W/PHACO Left 05/21/2015   Procedure: CATARACT EXTRACTION PHACO AND INTRAOCULAR LENS PLACEMENT (IOC);  Surgeon:  Birder Robson, MD;  Location: [facility];  Service: Ophthalmology;  Laterality: Left;  [place] 00:35 AP% 22.9 CDE 8.11 fluid pack lot #[id number] H  . CATARACT EXTRACTION W/PHACO Right 06/04/2015   Procedure: CATARACT EXTRACTION PHACO AND INTRAOCULAR LENS PLACEMENT (IOC);  Surgeon: Birder Robson, MD;  Location: [facility];  Service: Ophthalmology;  Laterality: Right;  US:00:48 AP%: 10.5 CDE:5.08 Fluid lot #[id number] H  . CYSTOSCOPY/URETEROSCOPY/HOLMIUM LASER/STENT PLACEMENT Bilateral 09/22/2016   Procedure: CYSTOSCOPY/URETEROSCOPY/HOLMIUM LASER/STENT PLACEMENT;  Surgeon: Hollice Espy, MD;  Location: [facility];  Service: Urology;  Laterality: Bilateral;  . EYE SURGERY  2015   tissue biopsy  . FOOT SURGERY  2015  . JOINT REPLACEMENT Left 2013   hip replacement  . LITHOTRIPSY    . PTOSIS REPAIR Bilateral 02/18/2016   Procedure: BILATERAL PTOSIS REPAIR UPPER EYELIDS;  Surgeon: Karle Starch, MD;  Location: [facility];  Service: Ophthalmology;  Laterality: Bilateral;  LEAVE PT EARLY AM  . thumb surgery Right   . TONSILLECTOMY  1973    There were no vitals filed for this visit.      Subjective Assessment - 03/16/17 1324    Pertinent History MS,bi-polar disorder, history of L THA  2013, OA needs Right THA, gout, DDD, chronic pain/fibromyalgia,  lower leg nuumbness, positional dizziness, osteoporosis, kidney stones   Patient Stated Goals  maintain her independence   Currently in Pain? Yes   Pain Score --  5-9/10   Pain Location Generalized   Pain Descriptors / Indicators Aching   Pain Type Chronic pain   Pain Onset More than a month ago   Pain Frequency Intermittent   Aggravating Factors  moving wrong   Pain Relieving Factors rest meds   Multiple Pain Sites No              Grooved pegboard for increased fine motor coordination, removing pegs with in hand manipulation, min v.c. Min-mod difficulty.     Arm bike level1 x 5 mins for conditioning.            OT  Education - 03/16/17 1348    Education provided Yes   Education Details yellow theraband HEP   Person(s) Educated Patient   Methods Explanation;Demonstration;Verbal cues;Handout   Comprehension Verbalized understanding;Returned demonstration;Verbal cues required          OT Short Term Goals - 03/16/17 1329      OT SHORT TERM GOAL #1   Title I with HEP. due 03/24/17   Time 4   Period Weeks   Status Achieved     OT SHORT TERM GOAL #2   Title Pt will verbalize understanding of adapted strategies(including AE/DME)/ energy conservation techniques to maximize safety and independence with ADLS/IADLs.   Time 4   Period Weeks   Status On-going     OT SHORT TERM GOAL #3   Title Pt will demonstrate ability to write a short paragraph with 100% legibility and no significant decrease in letter size.   Time 4   Period Weeks   Status On-going           OT Long Term Goals - 02/23/17 1652      OT LONG TERM GOAL #1   Title Pt will demonstrate improved standing balance for ADLS as evidenced by improving bilateral standing functional reach to 10 inches or greater. due 04/23/17   Baseline RUE 7 inches, LUE 9 inches   Time 8   Period Weeks   Status New     OT LONG TERM GOAL #2   Title Pt will demonstrate ability to retrieve a lightweight object at 130 shoulder flexion with RUE.   Time 8   Period Weeks   Status New     OT LONG TERM GOAL #3   Title Pt will verbalize understanding of ways to compensate for short term memory deficits.   Time 8   Period Weeks   Status New               Plan - 03/16/17 1646    Clinical Impression Statement Pt is progressing towards goals. She reports performing coordination and putty exercises at home.   Rehab Potential Fair   OT Frequency 2x / week   OT Duration 8 weeks   OT Treatment/Interventions Self-care/ADL training;DME and/or AE instruction;Patient/family education;Balance training;Therapeutic exercises;Ultrasound;Therapeutic  exercise;Therapeutic activities;Cognitive remediation/compensation;Passive range of motion;Functional Mobility Training;Neuromuscular education;Cryotherapy;Electrical Stimulation;Parrafin;Energy conservation;Manual Therapy;Visual/perceptual remediation/compensation   Plan address handwriting   Consulted and Agree with Plan of Care Patient      Patient will benefit from skilled therapeutic intervention in order to improve the following deficits and impairments:  Abnormal gait, Decreased cognition, Impaired flexibility, Pain, Impaired sensation, Decreased mobility, Decreased coordination, Decreased activity tolerance, Decreased endurance, Decreased range of motion, Decreased strength, Impaired tone, Impaired UE functional use, Difficulty walking, Decreased safety awareness, Decreased knowledge of precautions, Decreased balance  Visit Diagnosis: Muscle weakness (generalized)  Other lack of coordination  Unsteadiness on feet    Problem List Patient Active Problem List   Diagnosis Date Noted  . B12 deficiency 05/22/2016  . Dizziness 03/16/2016  . Medicare annual wellness visit, subsequent 10/25/2015  . Back pain 10/31/2014  . Bipolar disorder ([place]) 05/21/2014  . 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 Insomnia 09/01/2011  . Obesity 05/04/2011  . HLD (hyperlipidemia) 01/20/2011  . SMOKER 01/20/2011  . Chronic pain syndrome 01/07/2011  . RENAL CALCULUS, RECURRENT 11/13/2010  . MULTIPLE SCLEROSIS, PROGRESSIVE/RELAPSING 08/28/2010  . GERD 08/28/2010  . Osteoarthritis 08/28/2010  . NEPHROLITHIASIS, HX OF 08/28/2010    RINE,KATHRYN 03/16/2017, 4:51 PM  [facility] [address] [place], [place], [address] Phone: [phone]   Fax:  [phone]  Name: HAVEN PYLANT MRN: [id number] Date of Birth: [birth year]

## 2017-03-16 NOTE — Therapy (Signed)
[facility] [address] [place], [place], [address] Phone: [phone]   Fax:  [phone]  Speech Language Pathology Treatment  Patient Details  Name: Emily Moon MRN: [id number] Date of Birth: 04/11/1961 Referring Provider: [name], NP, Dr. Chipper Oman [facility])  Encounter Date: 03/16/2017      End of Session - 03/16/17 1450    SLP Start Time 1231   SLP Stop Time  1317   SLP Time Calculation (min) 46 min   Activity Tolerance Patient tolerated treatment well      Past Medical History:  Diagnosis Date  . Arthritis    osteo  . Asthma   . Bipolar disorder ([place]) 05/21/14  . Cataracts, bilateral   . DDD (degenerative disc disease), cervical    also back  . Depression   . Dizziness    Positional  . Edema    feet/legs  . Fibromyalgia syndrome   . Fungal infection    Finger nails  . GERD (gastroesophageal reflux disease)   . Gout   . Headache    seasonal allergies  . Heart palpitations   . 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 Hypercholesterolemia   . Multiple sclerosis (HCC)    weakness  . Nephrolithiasis    kidney stones  . Osteoporosis    osteoarthritis  . Pneumonia   . PONV (postoperative nausea and vomiting)    no problem after cataract surgery  . Psoriasis   . Renal stone   . Shortness of breath dyspnea    wheezing  . Sleep apnea 2012   sleep study / slight, no interventions  . Urinary frequency     Past Surgical History:  Procedure Laterality Date  . CATARACT EXTRACTION W/PHACO Left 05/21/2015   Procedure: CATARACT EXTRACTION PHACO AND INTRAOCULAR LENS PLACEMENT (IOC);  Surgeon: Birder Robson, MD;  Location: [facility];  Service: Ophthalmology;  Laterality: Left;  [place] 00:35 AP% 22.9 CDE 8.11 fluid pack lot #[id number] H  . CATARACT EXTRACTION W/PHACO Right 06/04/2015   Procedure: CATARACT EXTRACTION PHACO AND INTRAOCULAR LENS PLACEMENT (IOC);  Surgeon: Birder Robson, MD;   Location: [facility];  Service: Ophthalmology;  Laterality: Right;  US:00:48 AP%: 10.5 CDE:5.08 Fluid lot #[id number] H  . CYSTOSCOPY/URETEROSCOPY/HOLMIUM LASER/STENT PLACEMENT Bilateral 09/22/2016   Procedure: CYSTOSCOPY/URETEROSCOPY/HOLMIUM LASER/STENT PLACEMENT;  Surgeon: Hollice Espy, MD;  Location: [facility];  Service: Urology;  Laterality: Bilateral;  . EYE SURGERY  2015   tissue biopsy  . FOOT SURGERY  2015  . JOINT REPLACEMENT Left 2013   hip replacement  . LITHOTRIPSY    . PTOSIS REPAIR Bilateral 02/18/2016   Procedure: BILATERAL PTOSIS REPAIR UPPER EYELIDS;  Surgeon: Karle Starch, MD;  Location: [facility];  Service: Ophthalmology;  Laterality: Bilateral;  LEAVE PT EARLY AM  . thumb surgery Right   . TONSILLECTOMY  1973    There were no vitals filed for this visit.      Subjective Assessment - 03/16/17 1241    Subjective "We can't do that card game - I stink at it" - Pt reports hair loss - some evident today on frontal scalp"               ADULT SLP TREATMENT - 03/16/17 1235      General Information   Behavior/Cognition Alert;Cooperative;Pleasant mood     Treatment Provided   Treatment provided Cognitive-Linquistic     Pain Assessment   Pain Assessment 0-10   Pain Score 9    Pain Location  buttocks, legs    Pain Descriptors / Indicators Constant;Aching   Pain Intervention(s) Monitored during session     Cognitive-Linquistic Treatment   Treatment focused on Cognition   Skilled Treatment Pt reports doing card games at home to support cognition - Moderately complex attention to detail and reasoning taks with 95% accuracy and rare min A to attend to details.  Facilitated divided attention with simple conversation and moderately complex deduction puzzle with extended time and rare min redirection to puzzle. Occasional min A for moderately complex reasoning. Mildly comlplex functional math with usual min to mod verbal cues.      Assessment / Recommendations /  Plan   Plan Continue with current plan of care     Progression Toward Goals   Progression toward goals Progressing toward goals            SLP Short Term Goals - 03/16/17 1447      SLP SHORT TERM GOAL #1   Title Pt will attend to details in mildly complex cognitive linguistic activity with 95% accuracy and rare min A   Time 1   Period Weeks   Status Achieved     SLP SHORT TERM GOAL #2   Title Pt will alternate attention betwen 2 mildly complex cognitive linguistic activities with 90% on each and occasional min A   Time 1   Period Weeks   Status On-going     SLP SHORT TERM GOAL #3   Title Pt will demonstrate carryover of daily journal or notes in phone to recall pertinent daily events over 3 sessions   Time 1   Period Weeks   Status On-going     SLP SHORT TERM GOAL #4   Title Pt will demonstrate compensations for dysnomia in simple conversation with rare min A   Time 1   Status Deferred          SLP Long Term Goals - 03/16/17 1447      SLP LONG TERM GOAL #1   Title Pt will carryover journaling/phone notes to recall pertinent daily information over 5 sessions with rare min A   Time 5   Period Weeks   Status On-going     SLP LONG TERM GOAL #2   Title Pt will divide attention between 2 mildly complex cognitive linguistc tasks with 85% on each and occasional min A   Time 5   Period Weeks   Status On-going     SLP LONG TERM GOAL #3   Title Pt will verbalize compensations for attention with rare min A   Time 5   Period Weeks   Status On-going          Plan - 03/16/17 1449    Speech Therapy Frequency 1x /week  1x week per pt request due to financial reasons/copay      Patient will benefit from skilled therapeutic intervention in order to improve the following deficits and impairments:   Cognitive communication deficit    Problem List Patient Active Problem List   Diagnosis Date Noted  . B12 deficiency 05/22/2016  . Dizziness 03/16/2016  .  Medicare annual wellness visit, subsequent 10/25/2015  . Back pain 10/31/2014  . Bipolar disorder ([place]) 05/21/2014  . 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 Insomnia 09/01/2011  . Obesity 05/04/2011  . HLD (hyperlipidemia) 01/20/2011  . SMOKER 01/20/2011  . Chronic pain syndrome 01/07/2011  . RENAL  CALCULUS, RECURRENT 11/13/2010  . MULTIPLE SCLEROSIS, PROGRESSIVE/RELAPSING 08/28/2010  . GERD 08/28/2010  . Osteoarthritis 08/28/2010  . NEPHROLITHIASIS, HX OF 08/28/2010    Emily Moon, Emily Rusk MS, CCC-SLP 03/16/2017, 2:51 PM  [facility] [address] [place], [place], [address] Phone: [phone]   Fax:  [phone]   Name: Emily Moon MRN: [id number] Date of Birth: 19-Jan-1961

## 2017-03-16 NOTE — Patient Instructions (Signed)
Iliotibial Band Stretch, Supine    Lie supine, one leg straight, other leg bent, foot on outside of straight knee. Slowly and gently use foot of bent leg to move straight leg inward. Hold _15__ seconds.  Repeat _5__ times per session. Do _2__ sessions per day.  Copyright  VHI. All rights reserved.  Iliotibial Band Stretch  Piriformis Stretch, Sitting    Sit, one ankle on opposite knee, same-side hand on crossed knee. Push down on knee, keeping spine straight. Lean torso forward, with flat back, until tension is felt in hamstrings and gluteals of crossed-leg side. Hold _20__ seconds.  Repeat _5__ times per session. Do _2__ sessions per day. Complete exercise by the TOP picture, but if you are no longer feeling the stretch, then add the "forward lean"demonstrated in the bottom picture. Quads / HF, Supine    Lie near edge of bed, one leg bent, foot flat on bed. Other leg hanging over edge, relaxed, thigh resting entirely on bed. Bend hanging knee backward keeping thigh in contact with bed. Hold _20__ seconds.  Repeat _5__ times per session. Do _2__ sessions per day. Complete exercise by the TOP picture, and advance to the lower picture to increase the stretch. Hold the stretch (don't bounce).   Copyright  VHI. All rights reserved.    Copyright  VHI. All rights reserved.  Chair Sitting    Sit at edge of seat, spine straight, one leg extended. Put a hand on each thigh and bend forward from the hip, keeping spine straight. Allow hand on extended leg to reach toward toes. Support upper body with other arm. Hold __20_ seconds. Repeat _5__ times per session. Do __2_ sessions per day.  Copyright  VHI. All rights reserved.

## 2017-03-16 NOTE — Therapy (Signed)
[facility] [address] [place], [place], [address] Phone: [phone]   Fax:  [phone]  Physical Therapy Treatment  Patient Details  Name: Emily Moon MRN: [id number] Date of Birth: January 07, 1961 Referring Provider: [name], NP (Dr. Chipper Oman at [facility])  Encounter Date: 03/16/2017      PT End of Session - 03/16/17 1505    Visit Number 5   Number of Visits 17   Date for PT Re-Evaluation 04/05/17   Authorization Type [employer] G-code   PT Start Time 1400   PT Stop Time 1445   PT Time Calculation (min) 45 min   Activity Tolerance Patient tolerated treatment well   Behavior During Therapy [facility] for tasks assessed/performed      Past Medical History:  Diagnosis Date  . Arthritis    osteo  . Asthma   . Bipolar disorder ([place]) 05/21/14  . Cataracts, bilateral   . DDD (degenerative disc disease), cervical    also back  . Depression   . Dizziness    Positional  . Edema    feet/legs  . Fibromyalgia syndrome   . Fungal infection    Finger nails  . GERD (gastroesophageal reflux disease)   . Gout   . Headache    seasonal allergies  . Heart palpitations   . 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 Hypercholesterolemia   . Multiple sclerosis (HCC)    weakness  . Nephrolithiasis    kidney stones  . Osteoporosis    osteoarthritis  . Pneumonia   . PONV (postoperative nausea and vomiting)    no problem after cataract surgery  . Psoriasis   . Renal stone   . Shortness of breath dyspnea    wheezing  . Sleep apnea 2012   sleep study / slight, no interventions  . Urinary frequency     Past Surgical History:  Procedure Laterality Date  . CATARACT EXTRACTION W/PHACO Left 05/21/2015   Procedure: CATARACT EXTRACTION PHACO AND INTRAOCULAR LENS PLACEMENT (IOC);  Surgeon: Birder Robson, MD;  Location: [facility];  Service: Ophthalmology;  Laterality: Left;  [place] 00:35 AP% 22.9 CDE  8.11 fluid pack lot #[id number] H  . CATARACT EXTRACTION W/PHACO Right 06/04/2015   Procedure: CATARACT EXTRACTION PHACO AND INTRAOCULAR LENS PLACEMENT (IOC);  Surgeon: Birder Robson, MD;  Location: [facility];  Service: Ophthalmology;  Laterality: Right;  US:00:48 AP%: 10.5 CDE:5.08 Fluid lot #[id number]  . CYSTOSCOPY/URETEROSCOPY/HOLMIUM LASER/STENT PLACEMENT Bilateral 09/22/2016   Procedure: CYSTOSCOPY/URETEROSCOPY/HOLMIUM LASER/STENT PLACEMENT;  Surgeon: Hollice Espy, MD;  Location: [facility];  Service: Urology;  Laterality: Bilateral;  . EYE SURGERY  2015   tissue biopsy  . FOOT SURGERY  2015  . JOINT REPLACEMENT Left 2013   hip replacement  . LITHOTRIPSY    . PTOSIS REPAIR Bilateral 02/18/2016   Procedure: BILATERAL PTOSIS REPAIR UPPER EYELIDS;  Surgeon: Karle Starch, MD;  Location: [place];  Service: Ophthalmology;  Laterality: Bilateral;  LEAVE PT EARLY AM  . thumb surgery Right   . TONSILLECTOMY  1973    There were no vitals filed for this visit.      Subjective Assessment - 03/16/17 1403    Subjective Patient reports she is having a bad day today. She feels very fatigued and is "generally sore" all over. She reports she belives today's generalized pain is related to side effects from her medications. She reports being achy/sore/tight in her buttocks and R lateral thigh.   Pertinent History bi-polar disorder, history  of L THA  2013, OA needs Right THA, gout, DDD, chronic pain/fibromyalgia, MS, lower leg nuumbness, positional dizziness, osteoporosis, kidney stones   Limitations Lifting;Standing;Walking;House hold activities   Currently in Pain? Yes   Pain Score 6    Pain Location Leg   Pain Orientation Right   Pain Descriptors / Indicators Aching   Pain Type Chronic pain   Pain Onset 1 to 4 weeks ago   Aggravating Factors  medication (side effects of some of her medications)    Pain Relieving Factors medication (on pain medication to also help alleviate her pain)     Effect of Pain on Daily Activities patient feels the R leg pain also has an associated weakness    Pain Score 4   Pain Location Other (Comment)  generalized pain             [facility] PT Assessment - 03/16/17 1400      Special Tests    Special Tests Hip Special Tests   Hip Special Tests  Ober's Test;Piriformis Test     Ober's Test   Findings Positive   Side Right     Piriformis Test   Findings Positive   Side  Right                     [facility] Adult PT Treatment/Exercise - 03/16/17 1400      Bed Mobility   Bed Mobility Supine to Sit;Sit to Supine   Supine to Sit 5: Supervision   Supine to Sit Details (indicate cue type and reason) PT demo & verbal cues on proper technique to protect back. Pt return demo understanding   Sit to Supine 5: Supervision   Sit to Supine - Details (indicate cue type and reason) PT demo & verbal cues on proper technique to protect back. Pt return demo understanding     Transfers   Transfers Sit to Stand;Stand to Sit   Sit to Stand 6: Modified independent (Device/Increase time);With upper extremity assist;From chair/3-in-1   Stand to Sit 6: Modified independent (Device/Increase time);With upper extremity assist;To chair/3-in-1     Ambulation/Gait   Ambulation/Gait Yes   Ambulation/Gait Assistance 5: Supervision   Ambulation Distance (Feet) 75 Feet   Assistive device None   Gait Pattern Step-through pattern;Decreased stride length;Lateral hip instability;Decreased trunk rotation;Trunk flexed;Wide base of support   Ambulation Surface Level;Indoor   Gait Comments --     Exercises   Exercises Knee/Hip     Knee/Hip Exercises: Stretches   Hip Flexor Stretch Both;3 reps;20 seconds  performed supine at edge of mat   Hip Flexor Stretch Limitations added knee flexion to end of motion to increase hip flexor + quad stretch (see patient instructions for details)   ITB Stretch Right;3 reps;20 seconds  performed supine (see patient instructions)     Piriformis Stretch Both;3 reps;20 seconds  performed from seated position (see patient instructions)   Piriformis Stretch Limitations patient reports tightness/pain in buttocks. PT assessed ITB length via Ober's test, which was positive for ITB tightness.                PT Education - 03/16/17 1505    Education provided Yes   Education Details LE stretching HEP    Person(s) Educated Patient   Methods Explanation;Demonstration;Verbal cues;Tactile cues;Handout   Comprehension Verbalized understanding;Returned demonstration;Verbal cues required          PT Short Term Goals - 03/09/17 1020      PT SHORT TERM GOAL #1  Title Patient demonstrates & verbalizes understanding of initial HEP.  (Target Date: 03/12/2017)   Baseline 03/09/17: doing the counter top ex's, not the corner due to lack of space-problem solved this to in kitchen, closing bedroom door   Status Achieved     PT SHORT TERM GOAL #2   Title Patient verbalizes energy conservation techniques for MS. (Target Date: 03/12/2017)   Baseline 03/09/17: provided information today   Time --   Period --   Status Partially Met     PT SHORT TERM GOAL #3   Title Patient ambulates with head turns to scan environment during gait & maintains path with supervision.  (Target Date: 03/12/2017)   Baseline 03/09/17: met today   Status Achieved           PT Long Term Goals - 03/04/17 1758      PT LONG TERM GOAL #1   Title Patient demonstrates & verbalizes understanding of ongoing HEP / fitness plan including MS modifications.  (Target Date: 04/06/2017)   Time 2   Period Months   Status On-going     PT LONG TERM GOAL #2   Title Berg Balance >/= 45/56 to indicate lower fall risk.  (Target Date: 04/06/2017)   Time 2   Period Months   Status On-going     PT LONG TERM GOAL #3   Title Functional Gait Assessment >/= 16/30 to indicate lower fall risk.  (Target Date: 04/06/2017)   Time 2   Period Months   Status On-going     PT  LONG TERM GOAL #4   Title Timed Up & Go <13.5 sec and cognitive TUG increases <25% to indicate lower fall risk.  (Target Date: 04/06/2017)   Time 2   Period Months   Status On-going     PT LONG TERM GOAL #5   Status On-going               Plan - 03/16/17 1505    Clinical Impression Statement Today's skilled PT session focused on addressing the lateral thigh and buttock pain/tightness the patient reported at the beginning of the session. Patient presented to PT today saying that today was a bad day for her. She has increased generalized pain and feels fatigued. PT assessed patient's ITB length via Ober's test, and found tightness. PT instructed patient in LE stretching program to decresae LE pain. Patient will benefit from continued skilled PT to address remaining deficits.   Rehab Potential Good   Clinical Impairments Affecting Rehab Potential bi-polar disorder, history of L THA  2013, OA needs Right THA, gout, DDD, chronic pain/fibromyalgia, MS, lower leg nuumbness, positional dizziness, osteoporosis, kidney stones   PT Frequency 2x / week   PT Duration 8 weeks   PT Treatment/Interventions ADLs/Self Care Home Management;DME Instruction;Gait training;Stair training;Functional mobility training;Therapeutic activities;Therapeutic exercise;Balance training;Neuromuscular re-education;Patient/family education   PT Next Visit Plan assess LE stretching HEP compliance, dynamic gait and balance activities    Consulted and Agree with Plan of Care Patient      Patient will benefit from skilled therapeutic intervention in order to improve the following deficits and impairments:  Abnormal gait, Decreased activity tolerance, Decreased balance, Decreased cognition, Decreased coordination, Decreased endurance, Decreased knowledge of precautions, Decreased mobility, Decreased safety awareness, Decreased strength, Dizziness, Impaired flexibility, Postural dysfunction  Visit Diagnosis: Muscle weakness  (generalized)  Other lack of coordination  Unsteadiness on feet  Other abnormalities of gait and mobility  Pain in left hip  Pain in right hip  Chronic  midline low back pain without sciatica     Problem List Patient Active Problem List   Diagnosis Date Noted  . B12 deficiency 05/22/2016  . Dizziness 03/16/2016  . Medicare annual wellness visit, subsequent 10/25/2015  . Back pain 10/31/2014  . Bipolar disorder ([place]) 05/21/2014  . 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 Insomnia 09/01/2011  . Obesity 05/04/2011  . HLD (hyperlipidemia) 01/20/2011  . SMOKER 01/20/2011  . Chronic pain syndrome 01/07/2011  . RENAL CALCULUS, RECURRENT 11/13/2010  . MULTIPLE SCLEROSIS, PROGRESSIVE/RELAPSING 08/28/2010  . GERD 08/28/2010  . Osteoarthritis 08/28/2010  . NEPHROLITHIASIS, HX OF 08/28/2010   Arelia Sneddon, SPT  03/16/2017, 3:10 PM  Jamey Reas, PT, DPT PT Specializing in [place] 03/17/17 6:27 AM Phone:  [phone]  Fax:  [phone] [facility] [address] [place], [place] [address]     [facility] [address] [place], [place], [address] Phone: [phone]   Fax:  [phone]  Name: Emily Moon MRN: [id number] Date of Birth: 03-27-1961

## 2017-03-16 NOTE — Patient Instructions (Signed)
  Continue doing cognitive enhancing activities daily

## 2017-03-17 ENCOUNTER — Ambulatory Visit: Payer: Self-pay | Admitting: Primary Care

## 2017-03-18 ENCOUNTER — Encounter: Payer: Self-pay | Admitting: Speech Pathology

## 2017-03-18 ENCOUNTER — Encounter: Payer: Self-pay | Admitting: Primary Care

## 2017-03-18 ENCOUNTER — Ambulatory Visit (INDEPENDENT_AMBULATORY_CARE_PROVIDER_SITE_OTHER): Payer: PPO | Admitting: Primary Care

## 2017-03-18 ENCOUNTER — Ambulatory Visit: Payer: Self-pay | Admitting: Physical Therapy

## 2017-03-18 ENCOUNTER — Encounter: Payer: Self-pay | Admitting: Occupational Therapy

## 2017-03-18 VITALS — BP 116/78 | HR 98 | Temp 98.8°F | Ht 69.0 in | Wt 211.8 lb

## 2017-03-18 DIAGNOSIS — G35 Multiple sclerosis: Secondary | ICD-10-CM

## 2017-03-18 DIAGNOSIS — E538 Deficiency of other specified B group vitamins: Secondary | ICD-10-CM | POA: Diagnosis not present

## 2017-03-18 DIAGNOSIS — F3170 Bipolar disorder, currently in remission, most recent episode unspecified: Secondary | ICD-10-CM

## 2017-03-18 DIAGNOSIS — M62838 Other muscle spasm: Secondary | ICD-10-CM

## 2017-03-18 DIAGNOSIS — E785 Hyperlipidemia, unspecified: Secondary | ICD-10-CM

## 2017-03-18 DIAGNOSIS — R7303 Prediabetes: Secondary | ICD-10-CM | POA: Diagnosis not present

## 2017-03-18 DIAGNOSIS — M1990 Unspecified osteoarthritis, unspecified site: Secondary | ICD-10-CM

## 2017-03-18 LAB — TSH: TSH: 1.14 u[IU]/mL (ref 0.35–4.50)

## 2017-03-18 LAB — LIPID PANEL
Cholesterol: 316 mg/dL — ABNORMAL HIGH (ref 0–200)
HDL: 50.6 mg/dL (ref >39.00)
NONHDL: 265.39
TRIGLYCERIDES: 269 mg/dL — AB (ref 0.0–149.0)
Total CHOL/HDL Ratio: 6
VLDL: 53.8 mg/dL — ABNORMAL HIGH (ref 0.0–40.0)

## 2017-03-18 LAB — HEMOGLOBIN A1C: HEMOGLOBIN A1C: 6.1 % (ref 4.6–6.5)

## 2017-03-18 LAB — LDL CHOLESTEROL, DIRECT: Direct LDL: 215 mg/dL

## 2017-03-18 LAB — VITAMIN B12: Vitamin B-12: 254 pg/mL (ref 211–911)

## 2017-03-18 MED ORDER — CYANOCOBALAMIN 1000 MCG/ML IJ SOLN
1000.0000 ug | Freq: Once | INTRAMUSCULAR | Status: AC
  Administered 2017-03-18: 1000 ug via INTRAMUSCULAR

## 2017-03-18 MED ORDER — HYDROCODONE-ACETAMINOPHEN 10-325 MG PO TABS: 1.0000 | ORAL_TABLET | Freq: Four times a day (QID) | ORAL | 0 refills | Status: DC | PRN

## 2017-03-18 MED ORDER — TIZANIDINE HCL 4 MG PO TABS: 8.0000 mg | ORAL_TABLET | Freq: Four times a day (QID) | ORAL | 3 refills | Status: DC | PRN

## 2017-03-18 NOTE — Assessment & Plan Note (Signed)
Could not tolerate Crestor 5 mg every other day. Some side effects contributed but also believe her MS is progressing. Will have her continue Fish Oil for now, repeat lipids pending. Strongly encouraged her to improve diet and increase exercise.

## 2017-03-18 NOTE — Patient Instructions (Signed)
Complete lab work prior to leaving today. I will notify you of your results once received.   You were provided with an injection of vitamin B 12 today. I will notify you of your result and then contact you regarding when to get the injection repeated.  We increased the frequency of your Tizanidine back up to two tablets every 6 hours as needed for muscle spasms.  Continue to work on improvements in your diet. Limit fast food, junk food. Increase vegetables, fruit, whole grains. Continue to exercise.  Please message me with the strength of your Fish Oil capsules once you get home.  Follow up in 3 months for re-evaluation.  It was a pleasure to see you today!

## 2017-03-18 NOTE — Progress Notes (Signed)
Pre visit review using our clinic review tool, if applicable. No additional management support is needed unless otherwise documented below in the visit note. 

## 2017-03-18 NOTE — Assessment & Plan Note (Signed)
Chronic use of Norco for which she takes scheduled QID for chronic osteoarthritis and side effects of progressive multiple sclerosis. Doing well with occupational and speech therapy. She is doing well on Norco. I do believe in the need to continue this medication at the same dose. No adjustments made. UDS and controlled substance contract UTD. [place] controlled substance record reviewed and is without suspicious activity. Will send to supervising physician for review.

## 2017-03-18 NOTE — Assessment & Plan Note (Signed)
Due for recheck today. IM B 12 provided as she was low-normal during last check in February 2018.

## 2017-03-18 NOTE — Assessment & Plan Note (Signed)
Continues to follow with Neurology, OT, speech therapy.

## 2017-03-18 NOTE — Progress Notes (Signed)
Noted reviewed in detail and agree with care plan laid out by Allie Bossier NP.

## 2017-03-18 NOTE — Progress Notes (Signed)
Subjective:    Patient ID: Emily Moon, female    DOB: 02-Dec-1960, 56 y.o.   MRN: [id number]  HPI  Emily Moon is a 56 year old female who presents today for follow up.   1) Hyperlipidemia: Currently managed on Crestor 5 mg every other day that was initiated in mid March 2018 for uncontrolled hyperlipidemia. She has failed numerous other statin medications and could not afford Zetia.   Since initiation of Crestor she's noticed hair loss, increased depression, dry skin, increased sweet cravings, headaches, vomiting, insomnia, GERD, myalgias, and abdominal bloating. She stopped the Crestor several weeks ago and has noted little improvement in symptoms.   She is taking Fish Oil, 2 capsules once daily.  Diet currently consists of:  Breakfast: Raisin toast, Yogurt, sometimes skips Lunch: Skips Dinner: Skips, Junk food, Yogurt, Fast Food Snacks: Cheese, trail mix Desserts: None, when on Crestor daily. Beverages: Coffee with Almond Milk, plant drink mix, frosted coffee  Exercise: She is not currently exercising.  2) Multiple Sclerosis/Osteoarthritis/Chronic Pain: Located to the bilateral hips, right thigh pain, bilateral knees. She continues to experience muscle spasms with cramping since her Tizanidine dose was reduced to 8 mg every 8 hours, previously managed on 8 mg every 6 hours with better control. She is needing a refill of both her Tizanidine and Norco.   She is currently managed on Norco 10/325 mg every 6 hours for which she takes as prescribed daily. She is currently following with Neurology through [facility] for multiple sclerosis. She is participating in speech and occupational therapy weekly. Overall she feels as though her MS symptoms are progressing. She continues to refuse medications for treatment.   3) Vitamin B 12 Deficiency: No recent injection since 2017. Does experience fatigue and overall weakness. Her last B 12 check was low-normal in February 2018. She is due  for recheck today.  4) Anxiety and Depression: Following with psychiatry. Feels that depression is worse. She recently visited with her psychiatrist and voiced her concerns. She has little motivation to do anything. She doesn't feel like doing anything during the day. She denies SI/HI.  Review of Systems  Constitutional: Positive for fatigue.  Respiratory: Negative for shortness of breath.   Cardiovascular: Negative for chest pain.  Musculoskeletal: Positive for arthralgias and myalgias.  Neurological: Positive for weakness and numbness.  Psychiatric/Behavioral:       Depression       Past Medical History:  Diagnosis Date  . Arthritis    osteo  . Asthma   . Bipolar disorder ([place]) 05/21/14  . Cataracts, bilateral   . DDD (degenerative disc disease), cervical    also back  . Depression   . Dizziness    Positional  . Edema    feet/legs  . Fibromyalgia syndrome   . Fungal infection    Finger nails  . GERD (gastroesophageal reflux disease)   . Gout   . Headache    seasonal allergies  . Heart palpitations   . 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 Hypercholesterolemia   . Multiple sclerosis (HCC)    weakness  . Nephrolithiasis    kidney stones  . Osteoporosis    osteoarthritis  . Pneumonia   . PONV (postoperative nausea and vomiting)    no problem after cataract surgery  . Psoriasis   . Renal stone   . Shortness of breath dyspnea    wheezing  . Sleep apnea 2012   sleep study / slight, no interventions  . Urinary  frequency      Social History   Social History  . Marital status: Divorced    Spouse name: N/A  . Number of children: 1  . Years of education: N/A   Occupational History  . Customer Service Rep at [employer]  . Smoking status: Former Smoker    Years: 25.00    Types: Cigarettes    Quit date: 11/16/2012  . Smokeless tobacco: Never Used     Comment: occasional use  . Alcohol use No  . Drug use: Yes    Types:  Methylphenidate  . Sexual activity: No   Other Topics Concern  . Not on file   Social History Narrative  . No narrative on file    Past Surgical History:  Procedure Laterality Date  . CATARACT EXTRACTION W/PHACO Left 05/21/2015   Procedure: CATARACT EXTRACTION PHACO AND INTRAOCULAR LENS PLACEMENT (IOC);  Surgeon: Birder Robson, MD;  Location: [facility];  Service: Ophthalmology;  Laterality: Left;  [place] 00:35 AP% 22.9 CDE 8.11 fluid pack lot #[id number]  . CATARACT EXTRACTION W/PHACO Right 06/04/2015   Procedure: CATARACT EXTRACTION PHACO AND INTRAOCULAR LENS PLACEMENT (IOC);  Surgeon: Birder Robson, MD;  Location: [facility];  Service: Ophthalmology;  Laterality: Right;  US:00:48 AP%: 10.5 CDE:5.08 Fluid lot #[id number] H  . CYSTOSCOPY/URETEROSCOPY/HOLMIUM LASER/STENT PLACEMENT Bilateral 09/22/2016   Procedure: CYSTOSCOPY/URETEROSCOPY/HOLMIUM LASER/STENT PLACEMENT;  Surgeon: Hollice Espy, MD;  Location: [facility];  Service: Urology;  Laterality: Bilateral;  . EYE SURGERY  2015   tissue biopsy  . FOOT SURGERY  2015  . JOINT REPLACEMENT Left 2013   hip replacement  . LITHOTRIPSY    . PTOSIS REPAIR Bilateral 02/18/2016   Procedure: BILATERAL PTOSIS REPAIR UPPER EYELIDS;  Surgeon: Karle Starch, MD;  Location: [facility];  Service: Ophthalmology;  Laterality: Bilateral;  LEAVE PT EARLY AM  . thumb surgery Right   . TONSILLECTOMY  1973    Family History  Problem Relation Age of Onset  . Cancer Father     Abdomen with mastasis  . Cancer Mother   . Heart disease Mother   . Kidney disease Neg Hx   . Bladder Cancer Neg Hx   . Prostate cancer Neg Hx     Allergies  Allergen Reactions  . Albuterol Shortness Of Breath and Other (See Comments)    Makes pt feel jittery/ tacycardic  . Crestor [Rosuvastatin] Other (See Comments)    Joint pain, muscle pain, and hair loss  . Halcion [Triazolam] Other (See Comments)    Dizziness,headaches,bladder problems  . Levaquin [Levofloxacin In  D5w] Diarrhea and Itching    Shoulder pain  . Naproxen Sodium Swelling    Patient tolerates in small doses  . Tylenol [Acetaminophen] Swelling    Patient tolerates in small doses  . Cefaclor Other (See Comments)    Doesn't remember---unsure if actually allergic   . Diclofenac Sodium Other (See Comments)    "made very sick"  . Sulfa Antibiotics Itching    Unsure of reaction possibly itching  . Tramadol Itching and Nausea And Vomiting  . Aripiprazole Other (See Comments)    Muscle tension/cramping  . Ibuprofen Swelling    Patient tolerates in small doses    Current Outpatient Prescriptions on File Prior to Visit  Medication Sig Dispense Refill  . ASPERCREME LIDOCAINE EX Apply 1 application topically 4 (four) times daily as needed (for pain.).     Marland Kitchen aspirin 325 MG tablet Take 325 mg by mouth  every 4 (four) hours as needed for headache.     . Biotin 10000 MCG TABS Take 1 tablet by mouth daily.    . calcipotriene-betamethasone (TACLONEX) ointment Apply 1 application topically daily as needed (for psorasis).     . carboxymethylcellulose (REFRESH TEARS) 0.5 % SOLN Place 1-2 drops into both eyes 3 (three) times daily as needed.    . Cholecalciferol (VITAMIN D3) 10000 units capsule Take 10,000 Units by mouth daily.    . clobetasol cream (TEMOVATE) 1.54 % Apply 1 application topically 2 (two) times daily. 30 g 2  . cyanocobalamin (,VITAMIN B-12,) 1000 MCG/ML injection Inject 1,000 mcg into the muscle every 8 (eight) weeks. Every other month    . diclofenac sodium (VOLTAREN) 1 % GEL Apply 4 g topically 4 (four) times daily. (Patient taking differently: Apply 4 g topically 4 (four) times daily as needed (for pain.). ) 500 g 5  . docusate sodium (COLACE) 100 MG capsule Take 1 capsule (100 mg total) by mouth 2 (two) times daily. (Patient taking differently: Take 100 mg by mouth 2 (two) times daily as needed. ) 60 capsule 0  . DULoxetine (CYMBALTA) 20 MG capsule Take 2 capsules (40 mg total) by mouth  at bedtime. 60 capsule 4  . hydroquinone 4 % cream 1 APPLICATION TOPICALLY DAILY AS NEEDED FOR BLEMISHES.  3  . hydrOXYzine (VISTARIL) 50 MG capsule Take 2 capsules (100 mg total) by mouth at bedtime. 90 capsule 5  . ketorolac (TORADOL) 10 MG tablet Take 1 tablet (10 mg total) by mouth 2 (two) times daily as needed for severe pain. 60 tablet 0  . lamoTRIgine (LAMICTAL) 150 MG tablet Take 2 tablets (300 mg total) by mouth at bedtime. 60 tablet 5  . levalbuterol (XOPENEX HFA) 45 MCG/ACT inhaler Inhale 1-2 puffs into the lungs every 6 (six) hours as needed for wheezing. 1 Inhaler 5  . Lysine 1000 MG TABS Take 2,000-4,000 mg by mouth at bedtime. 2000 mg scheduled at bedtime and patient will take 4000 mg if she has outbreak    . mirabegron ER (MYRBETRIQ) 25 MG TB24 tablet Take 1 tablet (25 mg total) by mouth daily. 30 tablet 12  . neomycin-bacitracin-polymyxin (NEOSPORIN) [phone] ointment Apply 1 application topically 4 (four) times daily as needed (for cut/scrapes.).    Marland Kitchen nicotine (NICODERM CQ - DOSED IN MG/24 HOURS) 21 mg/24hr patch Place 1 patch (21 mg total) onto the skin daily. 28 patch 3  . phenazopyridine (AZO-TABS) 95 MG tablet Take 95 mg by mouth 3 (three) times daily as needed for pain.    . polyethylene glycol powder (GLYCOLAX/MIRALAX) powder MIX 17 GRAMS (1 CAPFUL) WITH 4-8 OZ OF LIQUID AND TAKE BY MOUTH TWICE DAILY AS NEEDED (Patient taking differently: MIX 17 GRAMS (1 CAPFUL) WITH 4-8 OZ OF LIQUID AND TAKE BY MOUTH TWICE DAILY AS NEEDED FOR CONSTIPATION) 527 g 0  . tamsulosin (FLOMAX) 0.4 MG CAPS capsule TAKE 1 CAPSULE (0.4 MG TOTAL) BY MOUTH DAILY. 30 capsule 0  . temazepam (RESTORIL) 30 MG capsule TAKE ONE TO TWO CAPSULES BY MOUTH NIGHTLY AT BEDTIME 60 capsule 2  . zolpidem (AMBIEN) 10 MG tablet Take 1 tablet (10 mg total) by mouth at bedtime. 135 tablet 0  . ondansetron (ZOFRAN ODT) 4 MG disintegrating tablet Take 1 tablet (4 mg total) by mouth every 8 (eight) hours as needed for  nausea. (Patient not taking: Reported on 03/18/2017) 12 tablet 0  . rosuvastatin (CRESTOR) 5 MG tablet Take 1 tablet by mouth  every other day for cholesterol. (Patient not taking: Reported on 03/18/2017) 45 tablet 1   Current Facility-Administered Medications on File Prior to Visit  Medication Dose Route Frequency Provider Last Rate Last Dose  . cyanocobalamin ((VITAMIN B-12)) injection 1,000 mcg  1,000 mcg Intramuscular Once [name], NP        BP 116/78   Pulse 98   Temp 98.8 F (37.1 C) (Oral)   Ht 5\' 9"  (1.753 m)   Wt 211 lb 12.8 oz (96.1 kg)   LMP 08/23/2014   SpO2 98%   BMI 31.28 kg/m    Objective:   Physical Exam  Constitutional: She appears well-nourished.  Neck: Neck supple. No thyromegaly present.  Cardiovascular: Normal rate and regular rhythm.   Pulmonary/Chest: Effort normal and breath sounds normal.  Skin: Skin is warm and dry.  Psychiatric: She has a normal mood and affect.          Assessment & Plan:

## 2017-03-18 NOTE — Assessment & Plan Note (Signed)
Increased depression, working with psychiatry.

## 2017-03-23 ENCOUNTER — Ambulatory Visit: Payer: PPO | Admitting: Physical Therapy

## 2017-03-23 ENCOUNTER — Ambulatory Visit: Payer: PPO | Admitting: Speech Pathology

## 2017-03-23 ENCOUNTER — Ambulatory Visit: Payer: PPO | Admitting: Occupational Therapy

## 2017-03-23 ENCOUNTER — Encounter: Payer: Self-pay | Admitting: Physical Therapy

## 2017-03-23 DIAGNOSIS — R41841 Cognitive communication deficit: Secondary | ICD-10-CM

## 2017-03-23 DIAGNOSIS — R41844 Frontal lobe and executive function deficit: Secondary | ICD-10-CM

## 2017-03-23 DIAGNOSIS — R2689 Other abnormalities of gait and mobility: Secondary | ICD-10-CM

## 2017-03-23 DIAGNOSIS — M6281 Muscle weakness (generalized): Secondary | ICD-10-CM

## 2017-03-23 DIAGNOSIS — R278 Other lack of coordination: Secondary | ICD-10-CM

## 2017-03-23 DIAGNOSIS — R2681 Unsteadiness on feet: Secondary | ICD-10-CM

## 2017-03-23 NOTE — Therapy (Signed)
[facility] [address] [place], [place], [address] Phone: [phone]   Fax:  [phone]  Occupational Therapy Treatment  Patient Details  Name: Emily Moon MRN: [id number] Date of Birth: 19-May-1961 Referring Provider: [name] NP  Encounter Date: 03/23/2017      OT End of Session - 03/23/17 1355    Visit Number 6   Number of Visits 17   Date for OT Re-Evaluation 04/04/17   Authorization Type [employer]   Authorization Time Period 60 days- recert 1/65/53-7/48/27   Authorization - Visit Number 6   Authorization - Number of Visits 10   OT Start Time 0786   OT Stop Time 1400   OT Time Calculation (min) 39 min   Activity Tolerance Patient tolerated treatment well   Behavior During Therapy [facility] for tasks assessed/performed      Past Medical History:  Diagnosis Date  . Arthritis    osteo  . Asthma   . Bipolar disorder ([place]) 05/21/14  . Cataracts, bilateral   . DDD (degenerative disc disease), cervical    also back  . Depression   . Dizziness    Positional  . Edema    feet/legs  . Fibromyalgia syndrome   . Fungal infection    Finger nails  . GERD (gastroesophageal reflux disease)   . Gout   . Headache    seasonal allergies  . Heart palpitations   . 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 Hypercholesterolemia   . Multiple sclerosis (HCC)    weakness  . Nephrolithiasis    kidney stones  . Osteoporosis    osteoarthritis  . Pneumonia   . PONV (postoperative nausea and vomiting)    no problem after cataract surgery  . Psoriasis   . Renal stone   . Shortness of breath dyspnea    wheezing  . Sleep apnea 2012   sleep study / slight, no interventions  . Urinary frequency     Past Surgical History:  Procedure Laterality Date  . CATARACT EXTRACTION W/PHACO Left 05/21/2015   Procedure: CATARACT EXTRACTION PHACO AND INTRAOCULAR LENS PLACEMENT (IOC);  Surgeon: Birder Robson, MD;  Location: [facility];  Service: Ophthalmology;  Laterality: Left;  [place] 00:35 AP% 22.9 CDE 8.11 fluid pack lot #[id number]  . CATARACT EXTRACTION W/PHACO Right 06/04/2015   Procedure: CATARACT EXTRACTION PHACO AND INTRAOCULAR LENS PLACEMENT (IOC);  Surgeon: Birder Robson, MD;  Location: [facility];  Service: Ophthalmology;  Laterality: Right;  US:00:48 AP%: 10.5 CDE:5.08 Fluid lot #[id number]  . CYSTOSCOPY/URETEROSCOPY/HOLMIUM LASER/STENT PLACEMENT Bilateral 09/22/2016   Procedure: CYSTOSCOPY/URETEROSCOPY/HOLMIUM LASER/STENT PLACEMENT;  Surgeon: Hollice Espy, MD;  Location: [facility];  Service: Urology;  Laterality: Bilateral;  . EYE SURGERY  2015   tissue biopsy  . FOOT SURGERY  2015  . JOINT REPLACEMENT Left 2013   hip replacement  . LITHOTRIPSY    . PTOSIS REPAIR Bilateral 02/18/2016   Procedure: BILATERAL PTOSIS REPAIR UPPER EYELIDS;  Surgeon: Karle Starch, MD;  Location: [facility];  Service: Ophthalmology;  Laterality: Bilateral;  LEAVE PT EARLY AM  . thumb surgery Right   . TONSILLECTOMY  1973    There were no vitals filed for this visit.      Subjective Assessment - 03/23/17 1321    Subjective  Pt reports receiving B-12 shot   Patient Stated Goals maintain her independence   Currently in Pain? Yes   Pain Score 6    Pain Location --  Generalized  Pain Descriptors / Indicators Aching   Pain Type Chronic pain   Pain Onset 1 to 4 weeks ago   Pain Frequency Intermittent   Aggravating Factors  medications   Pain Relieving Factors pain meds   Multiple Pain Sites No           Treatment: Therapist checked progress towards goals and reviewed handwriting strategies. Pt practiced handwriting activities then pt colored within the lines in an adult color book for increased hand eye coordination and to improve pt confidence so she will try at home.                   OT Education - 03/23/17 1611    Education provided Yes   Education Details  progress towards goals, handwriting strategies, memory compensations   Person(s) Educated Patient   Methods Explanation;Demonstration;Verbal cues;Handout   Comprehension Verbalized understanding;Returned demonstration;Verbal cues required          OT Short Term Goals - 03/23/17 1323      OT SHORT TERM GOAL #1   Title I with HEP. due 03/24/17   Time 4   Period Weeks   Status Achieved     OT SHORT TERM GOAL #2   Title Pt will verbalize understanding of adapted strategies(including AE/DME)/ energy conservation techniques to maximize safety and independence with ADLS/IADLs.   Time 4   Period Weeks   Status Achieved     OT SHORT TERM GOAL #3   Title Pt will demonstrate ability to write a short paragraph with 100% legibility and no significant decrease in letter size.   Time 4   Period Weeks   Status Partially Met  writes 3 sentences with only min decreased legibility/ letter size           OT Long Term Goals - 03/23/17 1346      OT LONG TERM GOAL #1   Title Pt will demonstrate improved standing balance for ADLS as evidenced by improving bilateral standing functional reach to 10 inches or greater. due 04/23/17   Time 8   Period Weeks   Status Achieved     OT LONG TERM GOAL #2   Title Pt will demonstrate ability to retrieve a lightweight object at 130 shoulder flexion with RUE.   Time 8   Period Weeks   Status Achieved     OT LONG TERM GOAL #3   Title Pt will verbalize understanding of ways to compensate for short term memory deficits.   Time 8   Period Weeks   Status Achieved               Plan - 03/23/17 1356    Clinical Impression Statement Pt requests discharge today. she demonstrates good overall progress.   Rehab Potential Fair   OT Frequency 2x / week   OT Duration 8 weeks   OT Treatment/Interventions Self-care/ADL training;DME and/or AE instruction;Patient/family education;Balance training;Therapeutic exercises;Ultrasound;Therapeutic  exercise;Therapeutic activities;Cognitive remediation/compensation;Passive range of motion;Functional Mobility Training;Neuromuscular education;Cryotherapy;Electrical Stimulation;Parrafin;Energy conservation;Manual Therapy;Visual/perceptual remediation/compensation   Plan discharge OT   Consulted and Agree with Plan of Care Patient      Patient will benefit from skilled therapeutic intervention in order to improve the following deficits and impairments:  Abnormal gait, Decreased cognition, Impaired flexibility, Pain, Impaired sensation, Decreased mobility, Decreased coordination, Decreased activity tolerance, Decreased endurance, Decreased range of motion, Decreased strength, Impaired tone, Impaired UE functional use, Difficulty walking, Decreased safety awareness, Decreased knowledge of precautions, Decreased balance  Visit Diagnosis: Muscle weakness (generalized)  Other lack of coordination  Unsteadiness on feet  Frontal lobe and executive function deficit      G-Codes - 2017-04-20 1358    Functional Assessment Tool Used (Outpatient only) standing functional reach:RUE 11 inch, LUE 11.5 inches, improved handwriting only minimal decrease in letter size and legibility.   Functional Limitation Self care   Self Care Goal Status [phone]) At least 1 percent but less than 20 percent impaired, limited or restricted   Self Care Discharge Status [phone]) At least 1 percent but less than 20 percent impaired, limited or restricted      Problem List Patient Active Problem List   Diagnosis Date Noted  . B12 deficiency 05/22/2016  . Dizziness 03/16/2016  . Medicare annual wellness visit, subsequent 10/25/2015  . Back pain 10/31/2014  . Bipolar disorder ([place]) 05/21/2014  . 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 Insomnia 09/01/2011  . Obesity 05/04/2011  . HLD (hyperlipidemia) 01/20/2011  .  SMOKER 01/20/2011  . Chronic pain syndrome 01/07/2011  . RENAL CALCULUS, RECURRENT 11/13/2010  . MULTIPLE SCLEROSIS, PROGRESSIVE/RELAPSING 08/28/2010  . GERD 08/28/2010  . Osteoarthritis 08/28/2010  . NEPHROLITHIASIS, HX OF 08/28/2010   OCCUPATIONAL THERAPY DISCHARGE SUMMARY  Current functional level related to goals / functional outcomes: Pt made excellent overall progress, see above.   Remaining deficits: Decreased strength, decreased endurance, decreased balance, cognitive deficits, decreased coordination   Education / Equipment: Pt was educated regarding: HEP,  memory compensations and energy conservation . She verbalizes understanding of all education. Plan: Patient agrees to discharge.  Patient goals were partially met. Patient is being discharged due to the patient's request.  ?????     Stevin Bielinski April 20, 2017, 4:12 PM  [facility] [address] [place], [place], [address] Phone: [phone]   Fax:  [phone]  Name: Emily Moon MRN: [id number] Date of Birth: 03-05-1961

## 2017-03-23 NOTE — Therapy (Signed)
[facility] [address] [place], [place], [address] Phone: [phone]   Fax:  [phone]  Physical Therapy Treatment  Patient Details  Name: Emily Moon MRN: [id number] Date of Birth: 22-Feb-1961 Referring Provider: [name], NP (Dr. Chipper Oman at [facility])  Encounter Date: 03/23/2017      PT End of Session - 03/23/17 1409    Visit Number 6   Number of Visits 17   Date for PT Re-Evaluation 04/05/17   Authorization Type [employer] G-code   PT Start Time 1404   PT Stop Time 1445   PT Time Calculation (min) 41 min   Activity Tolerance Patient tolerated treatment well   Behavior During Therapy [facility] for tasks assessed/performed      Past Medical History:  Diagnosis Date  . Arthritis    osteo  . Asthma   . Bipolar disorder ([place]) 05/21/14  . Cataracts, bilateral   . DDD (degenerative disc disease), cervical    also back  . Depression   . Dizziness    Positional  . Edema    feet/legs  . Fibromyalgia syndrome   . Fungal infection    Finger nails  . GERD (gastroesophageal reflux disease)   . Gout   . Headache    seasonal allergies  . Heart palpitations   . 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 Hypercholesterolemia   . Multiple sclerosis (HCC)    weakness  . Nephrolithiasis    kidney stones  . Osteoporosis    osteoarthritis  . Pneumonia   . PONV (postoperative nausea and vomiting)    no problem after cataract surgery  . Psoriasis   . Renal stone   . Shortness of breath dyspnea    wheezing  . Sleep apnea 2012   sleep study / slight, no interventions  . Urinary frequency     Past Surgical History:  Procedure Laterality Date  . CATARACT EXTRACTION W/PHACO Left 05/21/2015   Procedure: CATARACT EXTRACTION PHACO AND INTRAOCULAR LENS PLACEMENT (IOC);  Surgeon: Birder Robson, MD;  Location: [facility];  Service: Ophthalmology;  Laterality: Left;  [place] 00:35 AP% 22.9 CDE  8.11 fluid pack lot #[id number] H  . CATARACT EXTRACTION W/PHACO Right 06/04/2015   Procedure: CATARACT EXTRACTION PHACO AND INTRAOCULAR LENS PLACEMENT (IOC);  Surgeon: Birder Robson, MD;  Location: [facility];  Service: Ophthalmology;  Laterality: Right;  US:00:48 AP%: 10.5 CDE:5.08 Fluid lot #[id number] H  . CYSTOSCOPY/URETEROSCOPY/HOLMIUM LASER/STENT PLACEMENT Bilateral 09/22/2016   Procedure: CYSTOSCOPY/URETEROSCOPY/HOLMIUM LASER/STENT PLACEMENT;  Surgeon: Hollice Espy, MD;  Location: [facility];  Service: Urology;  Laterality: Bilateral;  . EYE SURGERY  2015   tissue biopsy  . FOOT SURGERY  2015  . JOINT REPLACEMENT Left 2013   hip replacement  . LITHOTRIPSY    . PTOSIS REPAIR Bilateral 02/18/2016   Procedure: BILATERAL PTOSIS REPAIR UPPER EYELIDS;  Surgeon: Karle Starch, MD;  Location: [facility];  Service: Ophthalmology;  Laterality: Bilateral;  LEAVE PT EARLY AM  . thumb surgery Right   . TONSILLECTOMY  1973    There were no vitals filed for this visit.      Subjective Assessment - 03/23/17 1407    Subjective Reports feeling better today. Had a vitamin B-12 shot and has more energy. Pain is less as well. Reports she needs today to be her last day due to other obligations coming up. did have a fall where she missed the chair- did not back up enough and sat in floor. Was  able to get herself back up.   Pertinent History bi-polar disorder, history of L THA  2013, OA needs Right THA, gout, DDD, chronic pain/fibromyalgia, MS, lower leg nuumbness, positional dizziness, osteoporosis, kidney stones   Limitations Lifting;Standing;Walking;House hold activities   Patient Stated Goals improve walking and balance   Currently in Pain? No/denies   Pain Score 0-No pain            [facility] PT Assessment - 03/23/17 1411      Berg Balance Test   Sit to Stand Able to stand without using hands and stabilize independently   Standing Unsupported Able to stand safely 2 minutes   Sitting with  Back Unsupported but Feet Supported on Floor or Stool Able to sit safely and securely 2 minutes   Stand to Sit Sits safely with minimal use of hands   Transfers Able to transfer safely, minor use of hands   Standing Unsupported with Eyes Closed Able to stand 10 seconds safely   Standing Ubsupported with Feet Together Able to place feet together independently and stand for 1 minute with supervision   From Standing, Reach Forward with Outstretched Arm Can reach confidently >25 cm (10")  >10 inches    From Standing Position, Pick up Object from Floor Able to pick up shoe safely and easily   From Standing Position, Turn to Look Behind Over each Shoulder Looks behind one side only/other side shows less weight shift  right > left   Turn 360 Degrees Able to turn 360 degrees safely one side only in 4 seconds or less  > 5 sec's left only, <4 sec's right side   Standing Unsupported, Alternately Place Feet on Step/Stool Able to stand independently and safely and complete 8 steps in 20 seconds   Standing Unsupported, One Foot in Front Able to place foot tandem independently and hold 30 seconds   Standing on One Leg Able to lift leg independently and hold 5-10 seconds   Total Score 52   Berg comment: 52/56= lower risk (25%)     Timed Up and Go Test   Normal TUG (seconds) 8.59   Cognitive TUG (seconds) 15.22     Functional Gait  Assessment   Gait assessed  Yes   Gait Level Surface Walks 20 ft in less than 5.5 sec, no assistive devices, good speed, no evidence for imbalance, normal gait pattern, deviates no more than 6 in outside of the 12 in walkway width.   Change in Gait Speed Able to smoothly change walking speed without loss of balance or gait deviation. Deviate no more than 6 in outside of the 12 in walkway width.   Gait with Horizontal Head Turns Performs head turns smoothly with no change in gait. Deviates no more than 6 in outside 12 in walkway width   Gait with Vertical Head Turns Performs head  turns with no change in gait. Deviates no more than 6 in outside 12 in walkway width.   Gait and Pivot Turn Pivot turns safely within 3 sec and stops quickly with no loss of balance.   Step Over Obstacle Is able to step over 2 stacked shoe boxes taped together (9 in total height) without changing gait speed. No evidence of imbalance.   Gait with Narrow Base of Support Is able to ambulate for 10 steps heel to toe with no staggering.   Gait with Eyes Closed Walks 20 ft, no assistive devices, good speed, no evidence of imbalance, normal gait pattern, deviates no  more than 6 in outside 12 in walkway width. Ambulates 20 ft in less than 7 sec.   Ambulating Backwards Walks 20 ft, uses assistive device, slower speed, mild gait deviations, deviates 6-10 in outside 12 in walkway width.   Steps Alternating feet, no rail.   Total Score 29   FGA comment: 29/30= low risk of falls             PT Short Term Goals - 03/09/17 1020      PT SHORT TERM GOAL #1   Title Patient demonstrates & verbalizes understanding of initial HEP.  (Target Date: 03/12/2017)   Baseline 03/09/17: doing the counter top ex's, not the corner due to lack of space-problem solved this to in kitchen, closing bedroom door   Status Achieved     PT SHORT TERM GOAL #2   Title Patient verbalizes energy conservation techniques for MS. (Target Date: 03/12/2017)   Baseline 03/09/17: provided information today   Time --   Period --   Status Partially Met     PT SHORT TERM GOAL #3   Title Patient ambulates with head turns to scan environment during gait & maintains path with supervision.  (Target Date: 03/12/2017)   Baseline 03/09/17: met today   Status Achieved           PT Long Term Goals - 03/23/17 1409      PT LONG TERM GOAL #1   Title Patient demonstrates & verbalizes understanding of ongoing HEP / fitness plan including MS modifications.  (Target Date: 04/06/2017)   Baseline 03/23/17: doing stretches regularly. not doing the  corner balance "i hate them, don't feel they work for me".  doing other balance: walking with head turns   Status Achieved     PT LONG TERM GOAL #2   Title Berg Balance >/= 45/56 to indicate lower fall risk.  (Target Date: 04/06/2017)   Baseline 03/23/17: 52/56 scored today   Time --   Period --   Status Achieved     PT LONG TERM GOAL #3   Title Functional Gait Assessment >/= 16/30 to indicate lower fall risk.  (Target Date: 04/06/2017)   Baseline 03/23/17: 29/30 scored today   Time --   Period --   Status Achieved     PT LONG TERM GOAL #4   Title Timed Up & Go <13.5 sec and cognitive TUG increases <25% to indicate lower fall risk.  (Target Date: 04/06/2017)   Baseline 03/23/17: 8.59 sec's with timed up and go; 15.22 sec's with cognitive timed up and go   Time --   Period --   Status Not Met     PT LONG TERM GOAL #5   Status On-going           Plan - 03/23/17 1409    Clinical Impression Statement Today's skilled session addressed progress toward LTGs with 3/4 met today and 1 goal not met. Pt will be discharged today per her request, primary PT notified.    Rehab Potential Good   Clinical Impairments Affecting Rehab Potential bi-polar disorder, history of L THA  2013, OA needs Right THA, gout, DDD, chronic pain/fibromyalgia, MS, lower leg nuumbness, positional dizziness, osteoporosis, kidney stones   PT Frequency 2x / week   PT Duration 8 weeks   PT Treatment/Interventions ADLs/Self Care Home Management;DME Instruction;Gait training;Stair training;Functional mobility training;Therapeutic activities;Therapeutic exercise;Balance training;Neuromuscular re-education;Patient/family education   PT Next Visit Plan discharge per pt request wtih 3/4 LTGs met.   Consulted and Agree  with Plan of Care Patient      Patient will benefit from skilled therapeutic intervention in order to improve the following deficits and impairments:  Abnormal gait, Decreased activity tolerance, Decreased balance,  Decreased cognition, Decreased coordination, Decreased endurance, Decreased knowledge of precautions, Decreased mobility, Decreased safety awareness, Decreased strength, Dizziness, Impaired flexibility, Postural dysfunction  Visit Diagnosis: Muscle weakness (generalized)  Unsteadiness on feet  Other abnormalities of gait and mobility       G-Codes - April 20, 2017 2147/02/19    Functional Assessment Tool Used (Outpatient Only) Timed Up-Go 8.59 sec, cognitive TUG 15.22 sec, Berg Balance 52/56   Functional Limitation Mobility: Walking and moving around      Problem List Patient Active Problem List   Diagnosis Date Noted  . B12 deficiency 05/22/2016  . Dizziness 03/16/2016  . Medicare annual wellness visit, subsequent 10/25/2015  . Back pain 10/31/2014  . Bipolar disorder ([place]) 05/21/2014  . 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 Insomnia 09/01/2011  . Obesity 05/04/2011  . HLD (hyperlipidemia) 01/20/2011  . SMOKER 01/20/2011  . Chronic pain syndrome 01/07/2011  . RENAL CALCULUS, RECURRENT 11/13/2010  . MULTIPLE SCLEROSIS, PROGRESSIVE/RELAPSING 08/28/2010  . GERD 08/28/2010  . Osteoarthritis 08/28/2010  . NEPHROLITHIASIS, HX OF 08/28/2010    Willow Ora, PTA, [facility] [address] [place] [facility], [place] [address] [phone] 04-20-2017, 9:49 PM   Name: AYLINN RYDBERG MRN: [id number] Date of Birth: 02-06-1961       G-Codes - 04-20-2017 February 19, 2147    Functional Assessment Tool Used (Outpatient Only) Timed Up-Go 8.59 sec, cognitive TUG 15.22 sec, Berg Balance 52/56   Functional Limitation Mobility: Walking and moving around   Mobility: Walking and Moving Around Goal Status [phone]) At least 20 percent but less than 40 percent impaired, limited or restricted   Mobility: Walking and Moving Around Discharge Status [phone]) At least 20 percent but less than 40  percent impaired, limited or restricted      PHYSICAL THERAPY DISCHARGE SUMMARY  Visits from Start of Care: 6  Current functional level related to goals / functional outcomes: See above   Remaining deficits: See above   Education / Equipment: HEP  Plan: Patient agrees to discharge.  Patient goals were met. Patient is being discharged due to meeting the stated rehab goals.  ?????         Jamey Reas, PT, DPT PT Specializing in [place] 03/24/17 3:59 PM Phone:  [phone]  Fax:  [phone] [facility] [address] [place], [place] [address]

## 2017-03-23 NOTE — Therapy (Signed)
[facility] [address] [place], [place], [address] Phone: [phone]   Fax:  [phone]  Speech Language Pathology Treatment  Patient Details  Name: Emily Moon MRN: [id number] Date of Birth: 1961-03-13 Referring Provider: [name], NP, Dr. Chipper Oman [facility])  Encounter Date: 03/23/2017      End of Session - 03/23/17 1505    Visit Number 6   Number of Visits 17   Date for SLP Re-Evaluation 04/20/17   SLP Start Time 23   SLP Stop Time  2633   SLP Time Calculation (min) 43 min   Activity Tolerance Patient tolerated treatment well      Past Medical History:  Diagnosis Date  . Arthritis    osteo  . Asthma   . Bipolar disorder ([place]) 05/21/14  . Cataracts, bilateral   . DDD (degenerative disc disease), cervical    also back  . Depression   . Dizziness    Positional  . Edema    feet/legs  . Fibromyalgia syndrome   . Fungal infection    Finger nails  . GERD (gastroesophageal reflux disease)   . Gout   . Headache    seasonal allergies  . Heart palpitations   . 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 Hypercholesterolemia   . Multiple sclerosis (HCC)    weakness  . Nephrolithiasis    kidney stones  . Osteoporosis    osteoarthritis  . Pneumonia   . PONV (postoperative nausea and vomiting)    no problem after cataract surgery  . Psoriasis   . Renal stone   . Shortness of breath dyspnea    wheezing  . Sleep apnea 2012   sleep study / slight, no interventions  . Urinary frequency     Past Surgical History:  Procedure Laterality Date  . CATARACT EXTRACTION W/PHACO Left 05/21/2015   Procedure: CATARACT EXTRACTION PHACO AND INTRAOCULAR LENS PLACEMENT (IOC);  Surgeon: Birder Robson, MD;  Location: [facility];  Service: Ophthalmology;  Laterality: Left;  [place] 00:35 AP% 22.9 CDE 8.11 fluid pack lot #[id number]  . CATARACT EXTRACTION W/PHACO Right 06/04/2015   Procedure: CATARACT  EXTRACTION PHACO AND INTRAOCULAR LENS PLACEMENT (IOC);  Surgeon: Birder Robson, MD;  Location: [facility];  Service: Ophthalmology;  Laterality: Right;  US:00:48 AP%: 10.5 CDE:5.08 Fluid lot #[id number]  . CYSTOSCOPY/URETEROSCOPY/HOLMIUM LASER/STENT PLACEMENT Bilateral 09/22/2016   Procedure: CYSTOSCOPY/URETEROSCOPY/HOLMIUM LASER/STENT PLACEMENT;  Surgeon: Hollice Espy, MD;  Location: [facility];  Service: Urology;  Laterality: Bilateral;  . EYE SURGERY  2015   tissue biopsy  . FOOT SURGERY  2015  . JOINT REPLACEMENT Left 2013   hip replacement  . LITHOTRIPSY    . PTOSIS REPAIR Bilateral 02/18/2016   Procedure: BILATERAL PTOSIS REPAIR UPPER EYELIDS;  Surgeon: Karle Starch, MD;  Location: [facility];  Service: Ophthalmology;  Laterality: Bilateral;  LEAVE PT EARLY AM  . thumb surgery Right   . TONSILLECTOMY  1973    There were no vitals filed for this visit.      Subjective Assessment - 03/23/17 1236    Subjective "I didn't do my homework - I was so sick"               ADULT SLP TREATMENT - 03/23/17 1237      General Information   Behavior/Cognition Alert;Cooperative;Pleasant mood     Treatment Provided   Treatment provided Cognitive-Linquistic     Pain Assessment   Pain Score 6    Pain Location  neck, legs,   Pain Descriptors / Indicators Constant;Aching   Pain Intervention(s) Monitored during session     Cognitive-Linquistic Treatment   Treatment focused on Cognition   Skilled Treatment Pt got a B-12 shot for energy level. Facilitated complex reasoning/functional math with  occasional min A to attend to details. Divided attention between complex card sort and naming task with  95% on each -and rare min A. She continues to report journaling and writing down pertinent information.      Assessment / Recommendations / Plan   Plan Continue with current plan of care     Progression Toward Goals   Progression toward goals Progressing toward goals           SLP Education - 03/23/17 1501    Education provided Yes   Education Details do cognitive activities every day   Methods Explanation   Comprehension Verbalized understanding          SLP Short Term Goals - 03/23/17 1505      SLP SHORT TERM GOAL #1   Title Pt will attend to details in mildly complex cognitive linguistic activity with 95% accuracy and rare min A   Time 1   Period Weeks   Status Achieved     SLP SHORT TERM GOAL #2   Title Pt will alternate attention betwen 2 mildly complex cognitive linguistic activities with 90% on each and occasional min A   Time 1   Period Weeks   Status On-going     SLP SHORT TERM GOAL #3   Title Pt will demonstrate carryover of daily journal or notes in phone to recall pertinent daily events over 3 sessions   Time 1   Period Weeks   Status On-going     SLP SHORT TERM GOAL #4   Title Pt will demonstrate compensations for dysnomia in simple conversation with rare min A   Time 1   Status Deferred          SLP Long Term Goals - 03/23/17 1505      SLP LONG TERM GOAL #1   Title Pt will carryover journaling/phone notes to recall pertinent daily information over 5 sessions with rare min A   Time 5   Period Weeks   Status Achieved     SLP LONG TERM GOAL #2   Title Pt will divide attention between 2 mildly complex cognitive linguistc tasks with 85% on each and occasional min A   Time 5   Period Weeks   Status Achieved     SLP LONG TERM GOAL #3   Title Pt will verbalize compensations for attention with rare min A   Time 5   Period Weeks   Status On-going          Plan - 03/23/17 1501    Clinical Impression Statement Divided attention with improvement today - 95% accuracy on each task and rare min A. Pt continues to report improved cognition, however, she has not followed up with cognitive activities at home due to depression, mood. Instructed her to do daily cognitive challenges to maximize function. Pt is traveling and  requests to cancel all May appointments. We discussed her picking up in June vs d/c and getting new order for ST if cognitive deficits worsen. Will place pt on hold for now, if she does not return by middle of June, will d/c pt.    Speech Therapy Frequency 1x /week   Treatment/Interventions Compensatory strategies;Patient/family education;Functional tasks;Cognitive reorganization;Multimodal communcation approach;Internal/external aids;SLP  instruction and feedback;Language facilitation   Potential to Achieve Goals Fair   Potential Considerations Medical prognosis;Severity of impairments;Ability to learn/carryover information   Consulted and Agree with Plan of Care Patient      Patient will benefit from skilled therapeutic intervention in order to improve the following deficits and impairments:   Cognitive communication deficit    Problem List Patient Active Problem List   Diagnosis Date Noted  . B12 deficiency 05/22/2016  . Dizziness 03/16/2016  . Medicare annual wellness visit, subsequent 10/25/2015  . Back pain 10/31/2014  . Bipolar disorder ([place]) 05/21/2014  . 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 Insomnia 09/01/2011  . Obesity 05/04/2011  . HLD (hyperlipidemia) 01/20/2011  . SMOKER 01/20/2011  . Chronic pain syndrome 01/07/2011  . RENAL CALCULUS, RECURRENT 11/13/2010  . MULTIPLE SCLEROSIS, PROGRESSIVE/RELAPSING 08/28/2010  . GERD 08/28/2010  . Osteoarthritis 08/28/2010  . NEPHROLITHIASIS, HX OF 08/28/2010    Charlisa Cham, Annye Rusk MS, CCC-SLP 03/23/2017, 3:06 PM  [facility] [address] [place], [place], [address] Phone: [phone]   Fax:  [phone]   Name: Emily Moon MRN: [id number] Date of Birth: May 06, 1961

## 2017-03-23 NOTE — Patient Instructions (Signed)
Memory Compensation Strategies  1. Use "WARM" strategy.  W= write it down  A= associate it  R= repeat it  M= make a mental note  2.   You can keep a Memory Notebook.  Use a 3-ring notebook with sections for the following: calendar, important names and phone numbers,  medications, doctors' names/phone numbers, lists/reminders, and a section to journal what you did  each day.   3.    Use a calendar to write appointments down.  4.    Write yourself a schedule for the day.  This can be placed on the calendar or in a separate section of the Memory Notebook.  Keeping a  regular schedule can help memory.  5.    Use medication organizer with sections for each day or morning/evening pills.  You may need help loading it  6.    Keep a basket, or pegboard by the door.  Place items that you need to take out with you in the basket or on the pegboard.  You may also want to  include a message board for reminders.  7.    Use sticky notes.  Place sticky notes with reminders in a place where the task is performed.  For example: " turn off the  stove" placed by the stove, "lock the door" placed on the door at eye level, " take your medications" on  the bathroom mirror or by the place where you normally take your medications.  8.    Use alarms/timers.  Use while cooking to remind yourself to check on food or as a reminder to take your medicine, or as a  reminder to make a call, or as a reminder to perform another task, etc.  

## 2017-03-25 ENCOUNTER — Ambulatory Visit: Payer: Self-pay | Admitting: Physical Therapy

## 2017-03-25 ENCOUNTER — Encounter: Payer: Self-pay | Admitting: Speech Pathology

## 2017-03-29 ENCOUNTER — Telehealth: Payer: PPO | Admitting: Family

## 2017-03-29 DIAGNOSIS — R300 Dysuria: Secondary | ICD-10-CM

## 2017-03-29 NOTE — Progress Notes (Signed)
Based on what you shared with me it looks like you have a serious condition that should be evaluated in a face to face office visit.  NOTE: Even if you have entered your credit card information for this eVisit, you will not be charged.   *We have carefully reviewed your chart, notes, other provider visits, and all of the detailed comments you left for [place]. We appreciate these comments as they allow [place] to have a better understanding of your condition. Given the complicated nature of your case and other serious health problems, we are concerned about your urinary symptoms. We want to deliver the best care possible to you and also ensure your safety. Taking that into account, you must be seen face-to-face for [place] to make the best treatment decisions. Please contact your Urologist and/or primary care and urge them to allow you to see them today. Another alternative is one of the [facility] Urgent Care facilities below. Labs/tests and a physical exam should be done in this case.   If you are having a true medical emergency please call 911.  If you need an urgent face to face visit,  has four urgent care centers for your convenience.  If you need care fast and have a high deductible or no insurance consider:   [email or web address]  [phone]  [address] [place], [place] [address] 8 am to 8 pm Monday-Friday 10 am to 4 pm Saturday-Sunday   The following sites will take your  insurance:    . [facility] [place] a Provider at this Location  [address] [place], [place] [address] . 10 am to 8 pm Monday-Friday . 12 pm to 8 pm Saturday-Sunday   . [facility] [place] a Provider at this Location  [place], [place], [place] [address] . 8 am to 8 pm Monday-Friday . 9 am to 6 pm Saturday . 11 am to 6 pm  Sunday   . [facility] at [place] Get Driving Directions  [address] [place], [place] [address] . 8 am to 8 pm Monday-Friday . 8 am to 4 pm Saturday-Sunday   Your e-visit answers were reviewed by a board certified advanced clinical practitioner to complete your personal care plan.  Thank you for using e-Visits.

## 2017-03-30 ENCOUNTER — Ambulatory Visit: Payer: Self-pay | Admitting: Physical Therapy

## 2017-03-30 ENCOUNTER — Encounter: Payer: Self-pay | Admitting: Occupational Therapy

## 2017-03-30 ENCOUNTER — Encounter: Payer: Self-pay | Admitting: Speech Pathology

## 2017-03-30 ENCOUNTER — Ambulatory Visit (INDEPENDENT_AMBULATORY_CARE_PROVIDER_SITE_OTHER): Payer: PPO | Admitting: Family Medicine

## 2017-03-30 ENCOUNTER — Encounter: Payer: Self-pay | Admitting: Family Medicine

## 2017-03-30 ENCOUNTER — Ambulatory Visit: Payer: Self-pay | Admitting: Family Medicine

## 2017-03-30 VITALS — BP 120/70 | HR 104 | Temp 99.0°F | Ht 69.0 in | Wt 214.5 lb

## 2017-03-30 DIAGNOSIS — G35 Multiple sclerosis: Secondary | ICD-10-CM

## 2017-03-30 DIAGNOSIS — N200 Calculus of kidney: Secondary | ICD-10-CM

## 2017-03-30 DIAGNOSIS — R350 Frequency of micturition: Secondary | ICD-10-CM | POA: Diagnosis not present

## 2017-03-30 DIAGNOSIS — R05 Cough: Secondary | ICD-10-CM | POA: Diagnosis not present

## 2017-03-30 DIAGNOSIS — R059 Cough, unspecified: Secondary | ICD-10-CM | POA: Insufficient documentation

## 2017-03-30 LAB — POC URINALSYSI DIPSTICK (AUTOMATED)
BILIRUBIN UA: NEGATIVE
GLUCOSE UA: NEGATIVE
KETONES UA: NEGATIVE
Leukocytes, UA: NEGATIVE
Nitrite, UA: NEGATIVE
Protein, UA: NEGATIVE
RBC UA: NEGATIVE
SPEC GRAV UA: 1.025 (ref 1.010–1.025)
Urobilinogen, UA: 0.2 E.U./dL
pH, UA: 6 (ref 5.0–8.0)

## 2017-03-30 MED ORDER — MIRABEGRON ER 50 MG PO TB24: 50.0000 mg | ORAL_TABLET | Freq: Every day | ORAL | 3 refills | Status: DC

## 2017-03-30 MED ORDER — LEVALBUTEROL TARTRATE 45 MCG/ACT IN AERO: 1.0000 | INHALATION_SPRAY | Freq: Four times a day (QID) | RESPIRATORY_TRACT | 5 refills | Status: DC | PRN

## 2017-03-30 NOTE — Assessment & Plan Note (Signed)
Likely viral URI.Marland Kitchen Possible cause of low grade temp. Refill xopene per pt request.. No current wheeze or abn lung eval.

## 2017-03-30 NOTE — Progress Notes (Signed)
Subjective:    Patient ID: Emily Moon, female    DOB: 1961/02/04, 56 y.o.   MRN: [id number]   She has history of MS, she always has urinary frequency.. Average 19 UOP a day.  She is on myrbetriq   Urinary Frequency   This is a new problem. The current episode started in the past 7 days. The maximum temperature recorded prior to her arrival was 100 - 100.9 F. She is not sexually active. There is a history of pyelonephritis. Associated symptoms include frequency and urgency. Pertinent negatives include no chills, discharge, hematuria, nausea, possible pregnancy or vomiting. Associated symptoms comments: Bilateral  midback pain. She has tried increased fluids ( azo and muscle relaxer) for the symptoms. The treatment provided mild relief. Her past medical history is significant for kidney stones, recurrent UTIs and a urological procedure.  Back Pain  Associated symptoms include a fever.  Fever   This is a new problem. The current episode started in the past 7 days (occured 2 days ago). The maximum temperature noted was 100 to 100.9 F (100.1). Pertinent negatives include no nausea or vomiting.    Hx of listhotripsy for stone.. Stents placed.  HX of urosepsis.Marland Kitchen enteroccocus.  Review of Systems  Constitutional: Positive for fever. Negative for chills.  Gastrointestinal: Negative for nausea and vomiting.  Genitourinary: Positive for frequency and urgency. Negative for hematuria.  Musculoskeletal: Positive for back pain.      Blood pressure 120/70, pulse (!) 104, temperature 99 F (37.2 C), temperature source Oral, height 5\' 9"  (1.753 m), weight 214 lb 8 oz (97.3 kg), last menstrual period 08/23/2014.  Objective:   Physical Exam  Constitutional: Vital signs are normal. She appears well-developed and well-nourished. She is cooperative.  Non-toxic appearance. She does not appear ill. No distress.  HENT:  Head: Normocephalic.  Right Ear: Hearing, tympanic membrane, external ear and ear  canal normal. Tympanic membrane is not erythematous, not retracted and not bulging.  Left Ear: Hearing, tympanic membrane, external ear and ear canal normal. Tympanic membrane is not erythematous, not retracted and not bulging.  Nose: No mucosal edema or rhinorrhea. Right sinus exhibits no maxillary sinus tenderness and no frontal sinus tenderness. Left sinus exhibits no maxillary sinus tenderness and no frontal sinus tenderness.  Mouth/Throat: Uvula is midline, oropharynx is clear and moist and mucous membranes are normal.  Eyes: Conjunctivae, EOM and lids are normal. Pupils are equal, round, and reactive to light. Lids are everted and swept, no foreign bodies found.  Neck: Trachea normal and normal range of motion. Neck supple. Carotid bruit is not present. No thyroid mass and no thyromegaly present.  Cardiovascular: Normal rate, regular rhythm, S1 normal, S2 normal, normal heart sounds, intact distal pulses and normal pulses.  Exam reveals no gallop and no friction rub.   No murmur heard. Pulmonary/Chest: Effort normal and breath sounds normal. No tachypnea. No respiratory distress. She has no decreased breath sounds. She has no wheezes. She has no rhonchi. She has no rales.  Abdominal: Soft. Normal appearance and bowel sounds are normal. There is no tenderness. There is no CVA tenderness.  Musculoskeletal:       Lumbar back: She exhibits tenderness.  Neurological: She is alert.  Skin: Skin is warm, dry and intact. No rash noted.  Psychiatric: Her speech is normal and behavior is normal. Judgment and thought content normal. Her mood appears not anxious. Cognition and memory are normal. She does not exhibit a depressed mood.  Assessment & Plan:

## 2017-03-30 NOTE — Patient Instructions (Addendum)
Increase myrbetriq to 50 mg daily.  We will call with urine culture results. Drink lots a of water.

## 2017-03-30 NOTE — Assessment & Plan Note (Signed)
Pt is possibly starting flare .. has noted increase urinary frequency with this in past.

## 2017-03-30 NOTE — Assessment & Plan Note (Signed)
Not clearly nephrolithiasis at this time. Consider if progression.

## 2017-03-30 NOTE — Assessment & Plan Note (Signed)
No clear UTI on UA.Marland Kitchen Possible worsening of bladder spasm.  Rule out UTI with culture given pt recent history of enterococcus. Increase myrbetriq to 50 mg daily. Not clearly kindey stone as minimal pain and blood in urine...  recomended increasing fluids. Pt request if culture neg and myrbetriq not helping in 1 week.. Consideration of refill flomax to use while on vacation jusyt incase he issue progresses to seem more like stone.

## 2017-03-30 NOTE — Addendum Note (Signed)
Addended by: Carter Kitten on: 03/30/2017 10:44 AM   Modules accepted: Orders

## 2017-03-31 LAB — URINE CULTURE: Organism ID, Bacteria: NO GROWTH

## 2017-04-01 ENCOUNTER — Ambulatory Visit: Payer: Self-pay | Admitting: Physical Therapy

## 2017-04-01 ENCOUNTER — Encounter: Payer: Self-pay | Admitting: Occupational Therapy

## 2017-04-01 ENCOUNTER — Encounter: Payer: Self-pay | Admitting: Speech Pathology

## 2017-04-06 ENCOUNTER — Ambulatory Visit: Payer: Self-pay | Admitting: Physical Therapy

## 2017-04-06 ENCOUNTER — Encounter: Payer: Self-pay | Admitting: Occupational Therapy

## 2017-04-06 ENCOUNTER — Encounter: Payer: Self-pay | Admitting: Speech Pathology

## 2017-04-08 ENCOUNTER — Ambulatory Visit: Payer: Self-pay | Admitting: Physical Therapy

## 2017-04-08 ENCOUNTER — Encounter: Payer: Self-pay | Admitting: Speech Pathology

## 2017-04-08 ENCOUNTER — Encounter: Payer: Self-pay | Admitting: Occupational Therapy

## 2017-04-13 ENCOUNTER — Ambulatory Visit: Payer: Self-pay | Admitting: Physical Therapy

## 2017-04-13 ENCOUNTER — Encounter: Payer: Self-pay | Admitting: Occupational Therapy

## 2017-04-13 ENCOUNTER — Encounter: Payer: Self-pay | Admitting: Speech Pathology

## 2017-04-15 ENCOUNTER — Encounter: Payer: Self-pay | Admitting: Speech Pathology

## 2017-04-15 ENCOUNTER — Encounter: Payer: Self-pay | Admitting: Occupational Therapy

## 2017-04-15 ENCOUNTER — Ambulatory Visit: Payer: Self-pay | Admitting: Physical Therapy

## 2017-04-21 ENCOUNTER — Ambulatory Visit: Payer: Self-pay

## 2017-04-22 ENCOUNTER — Ambulatory Visit: Payer: Self-pay | Admitting: Physical Therapy

## 2017-04-22 ENCOUNTER — Ambulatory Visit (INDEPENDENT_AMBULATORY_CARE_PROVIDER_SITE_OTHER): Payer: PPO | Admitting: *Deleted

## 2017-04-22 ENCOUNTER — Encounter: Payer: Self-pay | Admitting: Occupational Therapy

## 2017-04-22 ENCOUNTER — Ambulatory Visit (INDEPENDENT_AMBULATORY_CARE_PROVIDER_SITE_OTHER): Payer: PPO | Admitting: Family Medicine

## 2017-04-22 ENCOUNTER — Ambulatory Visit: Payer: PPO | Admitting: Speech Pathology

## 2017-04-22 ENCOUNTER — Encounter: Payer: Self-pay | Admitting: Family Medicine

## 2017-04-22 VITALS — BP 106/72 | HR 98 | Temp 98.8°F | Ht 69.0 in | Wt 209.2 lb

## 2017-04-22 DIAGNOSIS — M25551 Pain in right hip: Secondary | ICD-10-CM | POA: Diagnosis not present

## 2017-04-22 DIAGNOSIS — M7631 Iliotibial band syndrome, right leg: Secondary | ICD-10-CM

## 2017-04-22 DIAGNOSIS — G35 Multiple sclerosis: Secondary | ICD-10-CM

## 2017-04-22 DIAGNOSIS — M7062 Trochanteric bursitis, left hip: Secondary | ICD-10-CM

## 2017-04-22 DIAGNOSIS — M7061 Trochanteric bursitis, right hip: Secondary | ICD-10-CM | POA: Diagnosis not present

## 2017-04-22 DIAGNOSIS — R269 Unspecified abnormalities of gait and mobility: Secondary | ICD-10-CM | POA: Diagnosis not present

## 2017-04-22 DIAGNOSIS — E538 Deficiency of other specified B group vitamins: Secondary | ICD-10-CM | POA: Diagnosis not present

## 2017-04-22 DIAGNOSIS — Q6589 Other specified congenital deformities of hip: Secondary | ICD-10-CM

## 2017-04-22 MED ORDER — METHYLPREDNISOLONE ACETATE 40 MG/ML IJ SUSP
80.0000 mg | Freq: Once | INTRAMUSCULAR | Status: AC
  Administered 2017-04-22: 80 mg via INTRA_ARTICULAR

## 2017-04-22 MED ORDER — CYANOCOBALAMIN 1000 MCG/ML IJ SOLN
1000.0000 ug | Freq: Once | INTRAMUSCULAR | Status: AC
  Administered 2017-04-22: 1000 ug via INTRAMUSCULAR

## 2017-04-22 NOTE — Progress Notes (Signed)
Dr. Frederico Hamman T. Summer Mccolgan, MD, [facility] [place], [address] Phone: [phone] Fax: [phone]  04/22/2017  Patient: Emily Moon, MRN: [id number], DOB: 1961/08/13, 56 y.o.  Primary Physician:  [name], NP   Chief Complaint  Patient presents with  . Hip Pain    Right   Subjective:   Emily Moon is a 56 y.o. very pleasant female patient who presents with the following:  R hip and ITB: Pleasant patient with known congenital hip dysplasia who presents with right-sided lateral hip pain. She is status post total hip arthroplasty on the left. Right hip has been relatively preserved, but she has had continued lateral hip pain, she does have some gait disturbance partially due to her multiple sclerosis. Today she has additional pain along the lateral aspect of her thigh without any numbness tingling, radicular symptoms or paresthesias.  Past Medical History, Surgical History, Social History, Family History, Problem List, Medications, and Allergies have been reviewed and updated if relevant.  Patient Active Problem List   Diagnosis Date Noted  . Bipolar disorder ([place]) 05/21/2014    Priority: High  . MULTIPLE SCLEROSIS, PROGRESSIVE/RELAPSING 08/28/2010    Priority: High  . Chronic pain syndrome 01/07/2011    Priority: Medium  . Cough 03/30/2017  . Urinary frequency 03/30/2017  . B12 deficiency 05/22/2016  . Dizziness 03/16/2016  . Medicare annual wellness visit, subsequent 10/25/2015  . Back pain 10/31/2014  . 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 Insomnia 09/01/2011  . Obesity 05/04/2011  . HLD (hyperlipidemia) 01/20/2011  . SMOKER 01/20/2011  . RENAL CALCULUS, RECURRENT 11/13/2010  . GERD 08/28/2010  . Osteoarthritis 08/28/2010  . NEPHROLITHIASIS, HX OF 08/28/2010    Past Medical History:   Diagnosis Date  . Arthritis    osteo  . Asthma   . Bipolar disorder ([place]) 05/21/14  . Cataracts, bilateral   . DDD (degenerative disc disease), cervical    also back  . Depression   . Dizziness    Positional  . Edema    feet/legs  . Fibromyalgia syndrome   . Fungal infection    Finger nails  . GERD (gastroesophageal reflux disease)   . Gout   . Headache    seasonal allergies  . Heart palpitations   . 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 Hypercholesterolemia   . Multiple sclerosis ([facility])    weakness  . Nephrolithiasis    kidney stones  . Osteoporosis    osteoarthritis  . Pneumonia   . PONV (postoperative nausea and vomiting)    no problem after cataract surgery  . Psoriasis   . Renal stone   . Shortness of breath dyspnea    wheezing  . Sleep apnea 2012   sleep study / slight, no interventions  . Urinary frequency     Past Surgical History:  Procedure Laterality Date  . CATARACT EXTRACTION W/PHACO Left 05/21/2015   Procedure: CATARACT EXTRACTION PHACO AND INTRAOCULAR LENS PLACEMENT (IOC);  Surgeon: Birder Robson, MD;  Location: [facility];  Service: Ophthalmology;  Laterality: Left;  [place] 00:35 AP% 22.9 CDE 8.11 fluid pack lot #[id number]  . CATARACT EXTRACTION W/PHACO Right 06/04/2015   Procedure: CATARACT EXTRACTION PHACO AND INTRAOCULAR LENS PLACEMENT (IOC);  Surgeon: Birder Robson, MD;  Location: [facility];  Service: Ophthalmology;  Laterality: Right;  US:00:48 AP%: 10.5 CDE:5.08 Fluid lot #[id number]  .  CYSTOSCOPY/URETEROSCOPY/HOLMIUM LASER/STENT PLACEMENT Bilateral 09/22/2016   Procedure: CYSTOSCOPY/URETEROSCOPY/HOLMIUM LASER/STENT PLACEMENT;  Surgeon: Hollice Espy, MD;  Location: [facility];  Service: Urology;  Laterality: Bilateral;  . EYE SURGERY  2015   tissue biopsy  . FOOT SURGERY  2015  . JOINT REPLACEMENT Left 2013   hip replacement  . LITHOTRIPSY    . PTOSIS REPAIR Bilateral 02/18/2016   Procedure: BILATERAL PTOSIS REPAIR UPPER EYELIDS;  Surgeon:  Karle Starch, MD;  Location: [facility];  Service: Ophthalmology;  Laterality: Bilateral;  LEAVE PT EARLY AM  . thumb surgery Right   . TONSILLECTOMY  1973    Social History   Social History  . Marital status: Divorced    Spouse name: N/A  . Number of children: 1  . Years of education: N/A   Occupational History  . Customer Service Rep at [employer]  . Smoking status: Former Smoker    Years: 25.00    Types: Cigarettes    Quit date: 11/16/2012  . Smokeless tobacco: Never Used     Comment: occasional use  . Alcohol use No  . Drug use: Yes    Types: Methylphenidate  . Sexual activity: No   Other Topics Concern  . Not on file   Social History Narrative  . No narrative on file    Family History  Problem Relation Age of Onset  . Cancer Father        Abdomen with mastasis  . Cancer Mother   . Heart disease Mother   . Kidney disease Neg Hx   . Bladder Cancer Neg Hx   . Prostate cancer Neg Hx     Allergies  Allergen Reactions  . Albuterol Shortness Of Breath and Other (See Comments)    Makes pt feel jittery/ tacycardic  . Crestor [Rosuvastatin] Other (See Comments)    Joint pain, muscle pain, and hair loss  . Halcion [Triazolam] Other (See Comments)    Dizziness,headaches,bladder problems  . Levaquin [Levofloxacin In D5w] Diarrhea and Itching    Shoulder pain  . Naproxen Sodium Swelling    Patient tolerates in small doses  . Tylenol [Acetaminophen] Swelling    Patient tolerates in small doses  . Cefaclor Other (See Comments)    Doesn't remember---unsure if actually allergic   . Diclofenac Sodium Other (See Comments)    "made very sick"  . Sulfa Antibiotics Itching    Unsure of reaction possibly itching  . Tramadol Itching and Nausea And Vomiting  . Aripiprazole Other (See Comments)    Muscle tension/cramping  . Ibuprofen Swelling    Patient tolerates in small doses    Medication list reviewed and updated in full  in [place].  GEN: No fevers, chills. Nontoxic. Primarily MSK c/o today. MSK: Detailed in the HPI GI: tolerating PO intake without difficulty Neuro: No numbness, parasthesias, or tingling associated. Otherwise the pertinent positives of the ROS are noted above.   Objective:   BP 106/72   Pulse 98   Temp 98.8 F (37.1 C) (Oral)   Ht 5\' 9"  (1.753 m)   Wt 209 lb 4 oz (94.9 kg)   LMP 08/23/2014   BMI 30.90 kg/m    GEN: WDWN, NAD, Non-toxic, Alert & Oriented x 3 HEENT: Atraumatic, Normocephalic.  Ears and Nose: No external deformity. EXTR: No clubbing/cyanosis/edema NEURO: Normal gait.  PSYCH: Normally interactive. Conversant. Not depressed or anxious appearing.  Calm demeanor.   HIP EXAM: SIDE: R ROM:  Abduction, Flexion, Internal and External range of motion: full Pain with terminal IROM and EROM: mild GTB: marked Additional pain along the IT band SLR: NEG Knees: No effusion FABER: NT REVERSE FABER: NT, neg Piriformis: NT at direct palpation Str: flexion: 4/5 abduction: 3+/5 adduction: 4/5 Strength testing non-tender   Radiology: No results found.  Assessment and Plan:   Trochanteric bursitis of both hips - Plan: Ambulatory referral to Physical Therapy  Right hip pain - Plan: methylPREDNISolone acetate (DEPO-MEDROL) injection 80 mg, Ambulatory referral to Physical Therapy  MULTIPLE SCLEROSIS, PROGRESSIVE/RELAPSING - Plan: Ambulatory referral to Physical Therapy  Iliotibial band syndrome of right side - Plan: Ambulatory referral to Physical Therapy  Gait disturbance - Plan: Ambulatory referral to Physical Therapy  Hip dysplasia, congenital  Remarkable for weakness in hip weakness which is likely predisposing her to repetitive bursitis as well as IT band syndrome as well as gait disturbance. She is currently using a cane, and at baseline she does not.  Involvement of physical therapy to help with all of the above.  We will do a trochanteric bursa  injection a day for pain control.  Trochanteric Bursitis Injection, R Verbal consent obtained. Risks (including infection, potential atrophy), benefits, and alternatives reviewed. Greater trochanter sterilely prepped with Chloraprep. Ethyl Chloride used for anesthesia. 8 cc of Lidocaine 1% injected with 2 mL of Depo-Medrol 40 mg into trochanteric bursa at area of maximal tenderness at greater trochanter. Needle taken to bone to troch bursa, flows easily. Bursa massaged. No bleeding and no complications. Decreased pain after injection. Needle: 22 gauge spinal needle   Follow-up: No Follow-up on file.  Future Appointments Date Time Provider [place]  06/04/2017 8:30 AM Plovsky, Berneta Sages, MD [facility] None    Meds ordered this encounter  Medications  . methylPREDNISolone acetate (DEPO-MEDROL) injection 80 mg   There are no discontinued medications. Orders Placed This Encounter  Procedures  . Ambulatory referral to Physical Therapy    Signed,  Frederico Hamman T. Eshawn Coor, MD   Allergies as of 04/22/2017      Reactions   Albuterol Shortness Of Breath, Other (See Comments)   Makes pt feel jittery/ tacycardic   Crestor [rosuvastatin] Other (See Comments)   Joint pain, muscle pain, and hair loss   Halcion [triazolam] Other (See Comments)   Dizziness,headaches,bladder problems   Levaquin [levofloxacin In D5w] Diarrhea, Itching   Shoulder pain   Naproxen Sodium Swelling   Patient tolerates in small doses   Tylenol [acetaminophen] Swelling   Patient tolerates in small doses   Cefaclor Other (See Comments)   Doesn't remember---unsure if actually allergic    Diclofenac Sodium Other (See Comments)   "made very sick"   Sulfa Antibiotics Itching   Unsure of reaction possibly itching   Tramadol Itching, Nausea And Vomiting   Aripiprazole Other (See Comments)   Muscle tension/cramping   Ibuprofen Swelling   Patient tolerates in small doses      Medication List       Accurate as of  04/22/17  1:39 PM. Always use your most recent med list.          ASPERCREME LIDOCAINE EX Apply 1 application topically 4 (four) times daily as needed (for pain.).   aspirin 325 MG tablet Take 325 mg by mouth every 4 (four) hours as needed for headache.   AZO-TABS 95 MG tablet Generic drug:  phenazopyridine Take 95 mg by mouth 3 (three) times daily as needed for pain.   Biotin 10000 MCG Tabs Take 1  tablet by mouth daily.   calcipotriene-betamethasone ointment Commonly known as:  TACLONEX Apply 1 application topically daily as needed (for psorasis).   clobetasol cream 0.05 % Commonly known as:  TEMOVATE Apply 1 application topically 2 (two) times daily.   cyanocobalamin 1000 MCG/ML injection Commonly known as:  (VITAMIN B-12) Inject 1,000 mcg into the muscle every 30 (thirty) days. For 3 Months (June, July, August)   diclofenac sodium 1 % Gel Commonly known as:  VOLTAREN Apply 4 g topically 4 (four) times daily.   docusate sodium 100 MG capsule Commonly known as:  COLACE Take 1 capsule (100 mg total) by mouth 2 (two) times daily.   DULoxetine 20 MG capsule Commonly known as:  CYMBALTA Take 2 capsules (40 mg total) by mouth at bedtime.   HYDROcodone-acetaminophen 10-325 MG tablet Commonly known as:  NORCO Take 1 tablet by mouth every 6 (six) hours as needed for moderate pain or severe pain.   HYDROcodone-acetaminophen 10-325 MG tablet Commonly known as:  NORCO Take 1 tablet by mouth every 6 (six) hours as needed for severe pain.   HYDROcodone-acetaminophen 10-325 MG tablet Commonly known as:  NORCO Take 1 tablet by mouth every 6 (six) hours as needed for severe pain.   hydroquinone 4 % cream 1 APPLICATION TOPICALLY DAILY AS NEEDED FOR BLEMISHES.   hydrOXYzine 50 MG capsule Commonly known as:  VISTARIL Take 2 capsules (100 mg total) by mouth at bedtime.   ketorolac 10 MG tablet Commonly known as:  TORADOL Take 1 tablet (10 mg total) by mouth 2 (two) times  daily as needed for severe pain.   lamoTRIgine 150 MG tablet Commonly known as:  LAMICTAL Take 2 tablets (300 mg total) by mouth at bedtime.   levalbuterol 45 MCG/ACT inhaler Commonly known as:  XOPENEX HFA Inhale 1-2 puffs into the lungs every 6 (six) hours as needed for wheezing.   Lysine 1000 MG Tabs Take 2,000-4,000 mg by mouth at bedtime. 2000 mg scheduled at bedtime and patient will take 4000 mg if she has outbreak   mirabegron ER 50 MG Tb24 tablet Commonly known as:  MYRBETRIQ Take 1 tablet (50 mg total) by mouth daily.   neomycin-bacitracin-polymyxin 5-201 800 6193 ointment Apply 1 application topically 4 (four) times daily as needed (for cut/scrapes.).   nicotine 21 mg/24hr patch Commonly known as:  NICODERM CQ - dosed in mg/24 hours Place 1 patch (21 mg total) onto the skin daily.   ondansetron 4 MG disintegrating tablet Commonly known as:  ZOFRAN ODT Take 1 tablet (4 mg total) by mouth every 8 (eight) hours as needed for nausea.   polyethylene glycol powder powder Commonly known as:  GLYCOLAX/MIRALAX MIX 17 GRAMS (1 CAPFUL) WITH 4-8 OZ OF LIQUID AND TAKE BY MOUTH TWICE DAILY AS NEEDED   tamsulosin 0.4 MG Caps capsule Commonly known as:  FLOMAX TAKE 1 CAPSULE (0.4 MG TOTAL) BY MOUTH DAILY.   temazepam 30 MG capsule Commonly known as:  RESTORIL TAKE ONE TO TWO CAPSULES BY MOUTH NIGHTLY AT BEDTIME   tiZANidine 4 MG tablet Commonly known as:  ZANAFLEX Take 2 tablets (8 mg total) by mouth every 6 (six) hours as needed for muscle spasms.   Vitamin D3 10000 units capsule Take 10,000 Units by mouth daily.   zolpidem 10 MG tablet Commonly known as:  AMBIEN Take 1 tablet (10 mg total) by mouth at bedtime.

## 2017-04-28 ENCOUNTER — Encounter: Payer: Self-pay | Admitting: Speech Pathology

## 2017-04-28 ENCOUNTER — Encounter: Payer: Self-pay | Admitting: Occupational Therapy

## 2017-04-28 ENCOUNTER — Ambulatory Visit: Payer: Self-pay | Admitting: Physical Therapy

## 2017-05-03 ENCOUNTER — Ambulatory Visit: Payer: Self-pay | Admitting: Physical Therapy

## 2017-05-03 ENCOUNTER — Encounter: Payer: Self-pay | Admitting: Occupational Therapy

## 2017-05-03 ENCOUNTER — Encounter: Payer: Self-pay | Admitting: Speech Pathology

## 2017-05-05 ENCOUNTER — Ambulatory Visit: Payer: Self-pay | Admitting: Family Medicine

## 2017-05-07 ENCOUNTER — Other Ambulatory Visit: Payer: Self-pay | Admitting: Primary Care

## 2017-05-07 MED ORDER — MIRABEGRON ER 50 MG PO TB24: 50.0000 mg | ORAL_TABLET | Freq: Every day | ORAL | 3 refills | Status: DC

## 2017-05-11 ENCOUNTER — Ambulatory Visit: Payer: PPO | Admitting: Physical Therapy

## 2017-05-24 ENCOUNTER — Ambulatory Visit: Payer: PPO | Attending: Primary Care | Admitting: Physical Therapy

## 2017-05-24 ENCOUNTER — Encounter: Payer: Self-pay | Admitting: Physical Therapy

## 2017-05-24 DIAGNOSIS — R2681 Unsteadiness on feet: Secondary | ICD-10-CM | POA: Diagnosis not present

## 2017-05-24 DIAGNOSIS — Z9181 History of falling: Secondary | ICD-10-CM

## 2017-05-24 DIAGNOSIS — R2689 Other abnormalities of gait and mobility: Secondary | ICD-10-CM | POA: Diagnosis not present

## 2017-05-24 DIAGNOSIS — M25551 Pain in right hip: Secondary | ICD-10-CM | POA: Insufficient documentation

## 2017-05-24 DIAGNOSIS — M6281 Muscle weakness (generalized): Secondary | ICD-10-CM | POA: Insufficient documentation

## 2017-05-24 NOTE — Therapy (Signed)
[facility] [address] [place], [place], [address] Phone: [phone]   Fax:  [phone]  Physical Therapy Evaluation  Patient Details  Name: Emily Moon MRN: [id number] Date of Birth: 1961/11/03 Referring Provider: Owens Loffler MD   Encounter Date: 05/24/2017      PT End of Session - 05/24/17 1324    Visit Number 1   Number of Visits 18   Date for PT Re-Evaluation 07/23/17   Authorization Type [employer] G-code   PT Start Time 1230   PT Stop Time 1315   PT Time Calculation (min) 45 min   Activity Tolerance Patient tolerated treatment well;Patient limited by pain   Behavior During Therapy [facility] for tasks assessed/performed      Past Medical History:  Diagnosis Date  . Arthritis    osteo  . Asthma   . Bipolar disorder ([place]) 05/21/14  . Cataracts, bilateral   . DDD (degenerative disc disease), cervical    also back  . Depression   . Dizziness    Positional  . Edema    feet/legs  . Fibromyalgia syndrome   . Fungal infection    Finger nails  . GERD (gastroesophageal reflux disease)   . Gout   . Headache    seasonal allergies  . Heart palpitations   . 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 Hypercholesterolemia   . Multiple sclerosis (HCC)    weakness  . Nephrolithiasis    kidney stones  . Osteoporosis    osteoarthritis  . Pneumonia   . PONV (postoperative nausea and vomiting)    no problem after cataract surgery  . Psoriasis   . Renal stone   . Shortness of breath dyspnea    wheezing  . Sleep apnea 2012   sleep study / slight, no interventions  . Urinary frequency     Past Surgical History:  Procedure Laterality Date  . CATARACT EXTRACTION W/PHACO Left 05/21/2015   Procedure: CATARACT EXTRACTION PHACO AND INTRAOCULAR LENS PLACEMENT (IOC);  Surgeon: Birder Robson, MD;  Location: [facility];  Service: Ophthalmology;  Laterality: Left;  [place] 00:35 AP% 22.9 CDE  8.11 fluid pack lot #[id number]  . CATARACT EXTRACTION W/PHACO Right 06/04/2015   Procedure: CATARACT EXTRACTION PHACO AND INTRAOCULAR LENS PLACEMENT (IOC);  Surgeon: Birder Robson, MD;  Location: [facility];  Service: Ophthalmology;  Laterality: Right;  US:00:48 AP%: 10.5 CDE:5.08 Fluid lot #[id number]  . CYSTOSCOPY/URETEROSCOPY/HOLMIUM LASER/STENT PLACEMENT Bilateral 09/22/2016   Procedure: CYSTOSCOPY/URETEROSCOPY/HOLMIUM LASER/STENT PLACEMENT;  Surgeon: Hollice Espy, MD;  Location: [facility];  Service: Urology;  Laterality: Bilateral;  . EYE SURGERY  2015   tissue biopsy  . FOOT SURGERY  2015  . JOINT REPLACEMENT Left 2013   hip replacement  . LITHOTRIPSY    . PTOSIS REPAIR Bilateral 02/18/2016   Procedure: BILATERAL PTOSIS REPAIR UPPER EYELIDS;  Surgeon: Karle Starch, MD;  Location: [facility];  Service: Ophthalmology;  Laterality: Bilateral;  LEAVE PT EARLY AM  . thumb surgery Right   . TONSILLECTOMY  1973    There were no vitals filed for this visit.       Subjective Assessment - 05/24/17 1234    Subjective Patient is a 56 year old female presenting to OPPT due to R hip pain from trochanteric bursitis and ITB pain. She recieved 6 PT visits from 03/04/2017 to 03/23/2017 but self-discharged due to conflicts with her schedule. Patient reports her pain is worse when she first wakes in the morning. She reports  she is taking both pain and muscle relaxer medications to aid with pain in R hip. Patient's pain greatly increases rolling/moving in bed, and then decreases (some) when she is still (after rolling/moving in bed).     Pertinent History bi-polar disorder, history of L THA  2013, OA needs Right THA, gout, DDD, chronic pain/fibromyalgia, MS, lower leg numbness, positional dizziness, osteoporosis, kidney stones   Limitations Lifting;Standing;Walking;House hold activities;Sitting   Patient Stated Goals to get my RLE strong again, walk without reliance on cane    Currently in Pain? Yes    Pain Score 5   as high as 10/10; as low as  0/10   Pain Location Hip   Pain Orientation Right   Pain Descriptors / Indicators Aching   Pain Type Chronic pain   Pain Onset 1 to 4 weeks ago   Pain Frequency Intermittent   Pain Relieving Factors unweight RLE if standing             [facility] PT Assessment - 05/24/17 1230      Assessment   Medical Diagnosis MS and R hip pain    Referring Provider Owens Loffler MD    Onset Date/Surgical Date 04/22/17  PT referral    Prior Therapy OPPT   self requested d/c 03/23/2017 due to other obligations      Balance Screen   Has the patient fallen in the past 6 months Yes   How many times? ~5   Has the patient had a decrease in activity level because of a fear of falling?  Yes   Is the patient reluctant to leave their home because of a fear of falling?  No     Home Environment   Living Environment Private residence   Living Arrangements Other relatives   Type of [place] to enter   Entrance Stairs-Number of Steps 2   Entrance Stairs-Rails None   Home Layout Two level;Able to live on main level with bedroom/bathroom     Prior Function   Level of Independence Independent;Independent with basic ADLs   Vocation On disability     Observation/Other Assessments   Focus on Therapeutic Outcomes (FOTO)  50.70 Functional Status   Activities of Balance Confidence Scale (ABC Scale)  45.6%     Posture/Postural Control   Posture/Postural Control Postural limitations   Postural Limitations Rounded Shoulders;Forward head;Flexed trunk  wide stance     ROM / Strength   AROM / PROM / Strength AROM;Strength     Strength   Right Hip Flexion 3+/5   Right Hip ABduction 3/5   Left Hip Flexion 4/5   Left Hip ABduction 3+/5   Right Knee Flexion 3/5   Right Knee Extension 3+/5   Left Knee Flexion 3/5   Left Knee Extension 4/5   Right Ankle Dorsiflexion 3+/5   Left Ankle Dorsiflexion 4+/5     Flexibility   Soft Tissue  Assessment /Muscle Length yes   Hamstrings right -16* (90* hip flexion), left -19*   Quadriceps yes tightness   ITB yes tightness R>L   Piriformis yes tightness     Palpation   Palpation comment Trigger points palpated in R piriformis and R ITB (tenderness with palpation noted throughout the entire length of R ITB)     Special Tests   Hip Special Tests  Ober's Test;Other     Ober's Test   Findings Positive   Side Right     Piriformis Test   Findings --  Side  --     other   Comments PT noted slight leg length discrepancy with RLE shorter than LLE (approximately 1/8") in a standing position with equal weight bearing      Bed Mobility   Bed Mobility Rolling Right;Rolling Left;Supine to Sit  reports pain in R hip when performing all bed mobility   Rolling Right 7: Independent   Rolling Left 7: Independent   Supine to Sit 7: Independent   Sit to Supine 7: Independent     Transfers   Transfers Sit to Stand;Stand to Sit   Sit to Stand 6: Modified independent (Device/Increase time);With upper extremity assist;From chair/3-in-1  decreased WB on RLE    Stand to Sit 6: Modified independent (Device/Increase time);With upper extremity assist;To chair/3-in-1  decreased WB on RLE      Ambulation/Gait   Ambulation/Gait Yes   Ambulation/Gait Assistance 5: Supervision   Assistive device Straight cane   Gait Pattern Step-through pattern;Decreased stride length;Lateral hip instability;Decreased trunk rotation;Trunk flexed;Wide base of support;Decreased weight shift to right   Ambulation Surface Level;Indoor     Furniture conservator/restorer   Sit to Stand --   Standing Unsupported --   Sitting with Back Unsupported but Feet Supported on Floor or Stool --   Stand to Sit --   Transfers --   Standing Unsupported with Eyes Closed --   Standing Ubsupported with Feet Together --   From Standing, Reach Forward with Outstretched Arm --   From Standing Position, Pick up Object from Floor --   From  Standing Position, Turn to Look Behind Over each Shoulder --   Turn 360 Degrees --   Standing Unsupported, Alternately Place Feet on Step/Stool --   Standing Unsupported, One Foot in Front --   Standing on One Leg --   Total Score --   Merrilee Jansky comment: --     Timed Up and Go Test   Normal TUG (seconds) --   Cognitive TUG (seconds) --     Functional Gait  Assessment   Gait assessed  --   Gait Level Surface --   Change in Gait Speed --   Gait with Horizontal Head Turns --   Gait with Vertical Head Turns --   Gait and Pivot Turn --   Step Over Obstacle --   Gait with Narrow Base of Support --   Gait with Eyes Closed --   Ambulating Backwards --   Steps --   Total Score --   FGA comment: --            Objective measurements completed on examination: See above findings.                  PT Education - 05/24/17 1304    Education provided Yes   Education Details plan of care; use of body pillow between legs when sleeping in sidelying position    Person(s) Educated Patient   Methods Explanation;Demonstration;Tactile cues;Verbal cues   Comprehension Verbalized understanding;Returned demonstration          PT Short Term Goals - 05/24/17 1343      PT SHORT TERM GOAL #1   Title Patient will verbalize understanding and return demonstration for initial HEP to increase LE strength and flexibility to decrease pain in R hip. (TARGET DATE: 06/25/2017)    Time 1   Period Months   Status New     PT SHORT TERM GOAL #2   Title Patient will report R hip pain as </=  4-5/10 when performing bed mobility including rolling to both sides and transferring supine <> sit to indicate improvement in R hip pain. (TARGET DATE: 06/25/2017)    Time 1   Period Months   Status New     PT SHORT TERM GOAL #3   Title Patient reports >25% improvement in right hip pain with standing & gait activities. (Target Date: 06/25/2017)   Time 1   Period Months   Status New     PT SHORT TERM GOAL  #4   Title Patient's 90/90 hamstring measurement on R side will be improve to >/= -12* from full knee extension with reports of an increase in pain as </=3 points higher than her baseline report of pain (on a 10 point pain scale) to indicate improvement in LE flexibility and a decrease in pain. (TARGET DATE: 06/25/2017)    Time 1   Period Months   Status New           PT Long Term Goals - 05/24/17 1334      PT LONG TERM GOAL #1   Title Patient will verbalize understanding and return demonstration of ongoing HEP to increase LE strength and flexibility to decrease R hip pain and risk of falling. (TARGET DATE: 07/23/2017)    Time 2   Period Months   Status New     PT LONG TERM GOAL #2   Title Patient will report R hip pain as </= 3/10 when performing bed mobility including rolling to both sides and transferring supine <> sit to indicate improvement in R hip pain. (TARGET DATE: 07/23/2017)    Time 2   Period Months   Status New     PT LONG TERM GOAL #3   Title Patient reports >50% improvement in pain with standing & gait activities. (Target Date: 07/23/2017)   Time 2   Period Months   Status New     PT LONG TERM GOAL #4   Title Patient's 90/90 hamstring measurement on R side will be improve to >/= -8* from full knee extension without an increase in pain to indicate improvement in LE flexibility and a decrease in pain. (TARGET DATE: 07/23/2017)    Time 2   Period Months   Status New     PT LONG TERM GOAL #5   Title Patient reports a >10% improvement in ABC scale using FOTO (TARGET DATE: 07/23/2017)    Time 2   Period Months   Status New                Plan - 05/24/17 1330    Clinical Impression Statement  Patient is a 56 year old female presenting to [facility] with an evolving clinical presentation for a moderate complexity PT evaluation due to R hip pain due to trochanteric bursitis and ITB pain. The following deficits were noted during the patient's exam: decreased weight  bearing on RLE when standing, ambulating and transferring sit <> stand placing the patient at an increased risk for falling, weakness in BLEs increasing her risk of falling, and pain with palpation of R greater trochanter, ITB, and piriformis with palpable trigger points in piriformis and ITB. Patient is unable to complete bed mobility including rolling (both directions) and supine <> sit without pain in R hip. Patient would benefit from skilled PT to address these impairments and functional limitations to maximize functional mobility independence and reduce falls risk.    History and Personal Factors relevant to plan of care: bi-polar disorder, history  of L THA  2013, OA needs Right THA, gout, DDD, chronic pain/fibromyalgia, MS, lower leg numbness, positional dizziness, osteoporosis, kidney stones   Clinical Presentation Evolving   Clinical Presentation due to: PMH significant for MS and recent increase in R hip pain    Clinical Decision Making Moderate   Rehab Potential Good   Clinical Impairments Affecting Rehab Potential bi-polar disorder, history of L THA  2013, OA needs Right THA, gout, DDD, chronic pain/fibromyalgia, MS, lower leg nuumbness, positional dizziness, osteoporosis, kidney stones   PT Frequency 2x / week   PT Duration Other (comment)  9 weeks (60 days)   PT Treatment/Interventions ADLs/Self Care Home Management;DME Instruction;Gait training;Stair training;Functional mobility training;Therapeutic activities;Therapeutic exercise;Balance training;Neuromuscular re-education;Patient/family education   PT Next Visit Plan initiate HEP for LE strengthening and flexibility to relieve pain in R hip    Consulted and Agree with Plan of Care Patient      Patient will benefit from skilled therapeutic intervention in order to improve the following deficits and impairments:  Abnormal gait, Decreased activity tolerance, Decreased balance, Decreased endurance, Decreased knowledge of precautions,  Decreased mobility, Decreased strength, Dizziness, Impaired flexibility, Postural dysfunction, Difficulty walking, Pain  Visit Diagnosis: Muscle weakness (generalized)  Pain in right hip  Unsteadiness on feet  History of falling  Other abnormalities of gait and mobility      G-Codes - 06/06/17 1349    Functional Assessment Tool Used (Outpatient Only) Pain in R hip is as high as 10/10 and is painful when performing bed mobility and weight bearing on RLE from standing position    Functional Limitation Other PT primary   Other PT Primary Current Status (U8891) At least 60 percent but less than 80 percent impaired, limited or restricted   Other PT Primary Goal Status (Q9450) At least 20 percent but less than 40 percent impaired, limited or restricted       Problem List Patient Active Problem List   Diagnosis Date Noted  . Cough 03/30/2017  . Urinary frequency 03/30/2017  . B12 deficiency 05/22/2016  . Dizziness 03/16/2016  . Medicare annual wellness visit, subsequent 10/25/2015  . Back pain 10/31/2014  . Bipolar disorder ([place]) 05/21/2014  . 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 Insomnia 09/01/2011  . Obesity 05/04/2011  . HLD (hyperlipidemia) 01/20/2011  . SMOKER 01/20/2011  . Chronic pain syndrome 01/07/2011  . RENAL CALCULUS, RECURRENT 11/13/2010  . MULTIPLE SCLEROSIS, PROGRESSIVE/RELAPSING 08/28/2010  . GERD 08/28/2010  . Osteoarthritis 08/28/2010  . NEPHROLITHIASIS, HX OF 08/28/2010   Arelia Sneddon, SPT 06/06/17, 3:57 PM  Jamey Reas, PT, DPT  2017/06/06, 9:52 PM  [facility] [address] [place], [place], [address] Phone: [phone]   Fax:  [phone]  Name: Emily Moon MRN: [id number] Date of Birth: [birth year]

## 2017-05-25 ENCOUNTER — Telehealth: Payer: Self-pay

## 2017-05-25 ENCOUNTER — Other Ambulatory Visit: Payer: Self-pay | Admitting: Primary Care

## 2017-05-25 ENCOUNTER — Encounter: Payer: Self-pay | Admitting: Primary Care

## 2017-05-25 DIAGNOSIS — E538 Deficiency of other specified B group vitamins: Secondary | ICD-10-CM

## 2017-05-25 DIAGNOSIS — R32 Unspecified urinary incontinence: Secondary | ICD-10-CM

## 2017-05-25 MED ORDER — MIRABEGRON ER 25 MG PO TB24: 25.0000 mg | ORAL_TABLET | Freq: Every day | ORAL | 0 refills | Status: DC

## 2017-05-25 NOTE — Telephone Encounter (Signed)
Called patient and discussed concerns. She is in the doughnut hole with Medicare and cannot afford Myrbetriq. Recommended she speak with her Urologist to see if they have samples.

## 2017-05-25 NOTE — Telephone Encounter (Signed)
Noted. Please call and cancel the 50 mg of Myrbetriq. New Rx sent to pharmacy. She is due for a follow up office visit with me in early August 2018, please schedule.

## 2017-05-25 NOTE — Telephone Encounter (Signed)
Called and spoken to [facility] in [place]. Already cancel the Myrbetriq 50 mg.  Spoken to patient and noatified that the Myrbetriq 25 has been send to [facility]. Follow up has been schedule for 06/23/2017

## 2017-05-25 NOTE — Telephone Encounter (Signed)
Pt left v/m myrbetriq was increased to 50 mg at 03/30/17;myrbetriq 50 mg is causing too much pain and pt request to take myrbetriq 50 mg off med list and pt request new rx of myrbetriq 25 mg to [facility] [employer]Please advise.

## 2017-05-28 ENCOUNTER — Encounter: Payer: Self-pay | Admitting: Physical Therapy

## 2017-05-28 ENCOUNTER — Ambulatory Visit: Payer: PPO | Admitting: Physical Therapy

## 2017-05-28 DIAGNOSIS — M6281 Muscle weakness (generalized): Secondary | ICD-10-CM

## 2017-05-28 DIAGNOSIS — R2681 Unsteadiness on feet: Secondary | ICD-10-CM

## 2017-05-28 DIAGNOSIS — R2689 Other abnormalities of gait and mobility: Secondary | ICD-10-CM

## 2017-05-28 DIAGNOSIS — Z9181 History of falling: Secondary | ICD-10-CM

## 2017-05-28 DIAGNOSIS — M25551 Pain in right hip: Secondary | ICD-10-CM

## 2017-05-28 NOTE — Therapy (Signed)
[facility] [address] [place], [place], [address] Phone: [phone]   Fax:  [phone]  Physical Therapy Treatment  Patient Details  Name: Emily Moon MRN: [id number] Date of Birth: October 21, 1961 Referring Provider: Owens Loffler MD   Encounter Date: 05/28/2017      PT End of Session - 05/28/17 1144    Visit Number 2   Number of Visits 18   Date for PT Re-Evaluation 07/23/17   Authorization Type [employer] G-code   PT Start Time 1016   PT Stop Time 1100   PT Time Calculation (min) 44 min   Equipment Utilized During Treatment Gait belt   Activity Tolerance Patient tolerated treatment well   Behavior During Therapy WFL for tasks assessed/performed      Past Medical History:  Diagnosis Date  . Arthritis    osteo  . Asthma   . Bipolar disorder ([place]) 05/21/14  . Cataracts, bilateral   . DDD (degenerative disc disease), cervical    also back  . Depression   . Dizziness    Positional  . Edema    feet/legs  . Fibromyalgia syndrome   . Fungal infection    Finger nails  . GERD (gastroesophageal reflux disease)   . Gout   . Headache    seasonal allergies  . Heart palpitations   . 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 Hypercholesterolemia   . Multiple sclerosis (HCC)    weakness  . Nephrolithiasis    kidney stones  . Osteoporosis    osteoarthritis  . Pneumonia   . PONV (postoperative nausea and vomiting)    no problem after cataract surgery  . Psoriasis   . Renal stone   . Shortness of breath dyspnea    wheezing  . Sleep apnea 2012   sleep study / slight, no interventions  . Urinary frequency     Past Surgical History:  Procedure Laterality Date  . CATARACT EXTRACTION W/PHACO Left 05/21/2015   Procedure: CATARACT EXTRACTION PHACO AND INTRAOCULAR LENS PLACEMENT (IOC);  Surgeon: Birder Robson, MD;  Location: [facility];  Service: Ophthalmology;  Laterality: Left;  [place]  00:35 AP% 22.9 CDE 8.11 fluid pack lot #[id number]  . CATARACT EXTRACTION W/PHACO Right 06/04/2015   Procedure: CATARACT EXTRACTION PHACO AND INTRAOCULAR LENS PLACEMENT (IOC);  Surgeon: Birder Robson, MD;  Location: [facility];  Service: Ophthalmology;  Laterality: Right;  US:00:48 AP%: 10.5 CDE:5.08 Fluid lot #[id number]  . CYSTOSCOPY/URETEROSCOPY/HOLMIUM LASER/STENT PLACEMENT Bilateral 09/22/2016   Procedure: CYSTOSCOPY/URETEROSCOPY/HOLMIUM LASER/STENT PLACEMENT;  Surgeon: Hollice Espy, MD;  Location: [facility];  Service: Urology;  Laterality: Bilateral;  . EYE SURGERY  2015   tissue biopsy  . FOOT SURGERY  2015  . JOINT REPLACEMENT Left 2013   hip replacement  . LITHOTRIPSY    . PTOSIS REPAIR Bilateral 02/18/2016   Procedure: BILATERAL PTOSIS REPAIR UPPER EYELIDS;  Surgeon: Karle Starch, MD;  Location: [facility];  Service: Ophthalmology;  Laterality: Bilateral;  LEAVE PT EARLY AM  . thumb surgery Right   . TONSILLECTOMY  1973    There were no vitals filed for this visit.      Subjective Assessment - 05/28/17 1023    Subjective Patient reports persistent R hip pain radiating down outside and front of R thigh down towards lateral side of knee.   Pertinent History bi-polar disorder, history of L THA  2013, OA needs Right THA, gout, DDD, chronic pain/fibromyalgia, MS, lower leg numbness, positional dizziness, osteoporosis, kidney stones  Limitations Lifting;Standing;Walking;House hold activities;Sitting   Patient Stated Goals to get my RLE strong again, walk without reliance on cane    Currently in Pain? Yes   Pain Score 6    Pain Location Hip   Pain Descriptors / Indicators Radiating;Throbbing   Pain Type Chronic pain   Pain Onset 1 to 4 weeks ago   Pain Frequency Constant   Aggravating Factors  standing, bearing weight   Pain Relieving Factors unweighting the RLE if standing          [facility] Adult PT Treatment/Exercise - 05/28/17 1129      Exercises   Exercises  Other Exercises   Other Exercises  R Hip stretches and strengthening exercises, Core strengthening exercises performed for selection for HEP        R Hip stretches performed to improve flexibility and relieve R hip pain:  --"figure 4" stretch on R with L foot on floor  for 45 seconds x3 --3 way hip stretch using gait belt for support for 30 seconds each direction x3 given min VC's for technique  Core and R Hip strengthening:  --Performed pelvic tilts x10 given mod verbal and tactile cues to perform with accuracy --Bridge with green theraband placed around distal thighs to encourage abductor engagement for alignment and stability x10 -- prone R hip extension with straight leg x5, with knee bent x5 --L sidelying for clam x10, VC for alignment and technique --L sidelying for R hip aduction x8   VC for technique and alignment for all exercises performed.         PT Education - 05/28/17 1144    Education provided Yes   Education Details review of HEP   Person(s) Educated Patient   Methods Explanation;Demonstration;Verbal cues;Tactile cues;Handout   Comprehension Verbalized understanding;Returned demonstration          PT Short Term Goals - 05/24/17 1343      PT SHORT TERM GOAL #1   Title Patient will verbalize understanding and return demonstration for initial HEP to increase LE strength and flexibility to decrease pain in R hip. (TARGET DATE: 06/25/2017)    Time 1   Period Months   Status New     PT SHORT TERM GOAL #2   Title Patient will report R hip pain as </= 4-5/10 when performing bed mobility including rolling to both sides and transferring supine <> sit to indicate improvement in R hip pain. (TARGET DATE: 06/25/2017)    Time 1   Period Months   Status New     PT SHORT TERM GOAL #3   Title Patient reports >25% improvement in right hip pain with standing & gait activities. (Target Date: 06/25/2017)   Time 1   Period Months   Status New     PT SHORT TERM GOAL #4    Title Patient's 90/90 hamstring measurement on R side will be improve to >/= -12* from full knee extension with reports of an increase in pain as </=3 points higher than her baseline report of pain (on a 10 point pain scale) to indicate improvement in LE flexibility and a decrease in pain. (TARGET DATE: 06/25/2017)    Time 1   Period Months   Status New           PT Long Term Goals - 05/24/17 1334      PT LONG TERM GOAL #1   Title Patient will verbalize understanding and return demonstration of ongoing HEP to increase LE strength and flexibility to decrease  R hip pain and risk of falling. (TARGET DATE: 07/23/2017)    Time 2   Period Months   Status New     PT LONG TERM GOAL #2   Title Patient will report R hip pain as </= 3/10 when performing bed mobility including rolling to both sides and transferring supine <> sit to indicate improvement in R hip pain. (TARGET DATE: 07/23/2017)    Time 2   Period Months   Status New     PT LONG TERM GOAL #3   Title Patient reports >50% improvement in pain with standing & gait activities. (Target Date: 07/23/2017)   Time 2   Period Months   Status New     PT LONG TERM GOAL #4   Title Patient's 90/90 hamstring measurement on R side will be improve to >/= -8* from full knee extension without an increase in pain to indicate improvement in LE flexibility and a decrease in pain. (TARGET DATE: 07/23/2017)    Time 2   Period Months   Status New     PT LONG TERM GOAL #5   Title Patient reports a >10% improvement in ABC scale using FOTO (TARGET DATE: 07/23/2017)    Time 2   Period Months   Status New               Plan - 05/28/17 1203    Clinical Impression Statement Today's skilled session focused on development of patient HEP. Patient performed all exercises with accuracy given verbal, visual and tactile cues for alignment and technique. She demonstrated adequate understanding with return demonstration and stated verbal understanding. She reported  her R hip pain was mildly decreased down to 5/10 after performing therapeutic exercises. Patient will benefit from continued skilled PT to address LE strengthening, flexibility, and endurance.   Rehab Potential Good   Clinical Impairments Affecting Rehab Potential bi-polar disorder, history of L THA  2013, OA needs Right THA, gout, DDD, chronic pain/fibromyalgia, MS, lower leg nuumbness, positional dizziness, osteoporosis, kidney stones   PT Frequency 2x / week   PT Duration Other (comment)   PT Treatment/Interventions ADLs/Self Care Home Management;DME Instruction;Gait training;Stair training;Functional mobility training;Therapeutic activities;Therapeutic exercise;Balance training;Neuromuscular re-education;Patient/family education   PT Next Visit Plan Continue focus on LE strengthening and flexiblity to relieve R hip pain and increase functional independence   Consulted and Agree with Plan of Care Patient      Patient will benefit from skilled therapeutic intervention in order to improve the following deficits and impairments:  Abnormal gait, Decreased activity tolerance, Decreased balance, Decreased endurance, Decreased knowledge of precautions, Decreased mobility, Decreased strength, Dizziness, Impaired flexibility, Postural dysfunction, Difficulty walking, Pain  Visit Diagnosis: Muscle weakness (generalized)  Pain in right hip  Unsteadiness on feet  History of falling  Other abnormalities of gait and mobility     Problem List Patient Active Problem List   Diagnosis Date Noted  . Cough 03/30/2017  . Urinary frequency 03/30/2017  . B12 deficiency 05/22/2016  . Dizziness 03/16/2016  . Medicare annual wellness visit, subsequent 10/25/2015  . Back pain 10/31/2014  . Bipolar disorder ([place]) 05/21/2014  . 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 Insomnia 09/01/2011  . Obesity  05/04/2011  . HLD (hyperlipidemia) 01/20/2011  . SMOKER 01/20/2011  . Chronic pain syndrome 01/07/2011  . RENAL CALCULUS, RECURRENT 11/13/2010  . MULTIPLE SCLEROSIS, PROGRESSIVE/RELAPSING 08/28/2010  . GERD 08/28/2010  . Osteoarthritis 08/28/2010  .  NEPHROLITHIASIS, HX OF 08/28/2010    Emily Moon 05/28/2017, 12:08 PM  [facility] [address] [place], [place], [address] Phone: [phone]   Fax:  [phone]  Name: Emily Moon MRN: [id number] Date of Birth: 10-19-1961

## 2017-05-28 NOTE — Patient Instructions (Addendum)
PIRIFORMIS STRETCH  While lying on your back with both knee bent, cross your right leg on the other knee.   Keep left foot on floor, press right knee away for stretch, hold 30-60 seconds, perform 3 times daily. Copyright  VHI. All rights reserved.   HAMSTRING STRETCH WITH MULTI-LOOP STRAP  Lie on your back and place a stretching strap on your foot. Pull on the strap to assist in raising your leg up for a stretch to the back of your leg. Hold for 30 seconds, 3 times.  Take leg across body hold for 30 seconds, 3 times. Then take leg out to the side to stretch inner thigh, hold for 30 seconds, 3 times.  Keep your target leg straight to slightly bent the entire time.      PELVIC TILT - SUPINE  Lie on your back with your knees bent. Next, arch your low back and then flatten it repeatedly. Your pelvis should tilt forward and back during the movement. Move through a comfortable range of motion. Perform 5-10 times daily.  BRIDGING ELASTIC BAND ABDUCTION  While lying on your back, place an elastic band around your knees.  Hold this and then tighten your lower abdominals, squeeze your buttocks and raise your buttocks off the floor/bed as creating a "Bridge" with your body. Perform 10 times, 1-2 times a day.   CLAM SHELLS  While lying on your side with your knees bent, draw up the top knee while keeping contact of your feet together. Perform 10 times, 1-2 times daily.  Do not let your pelvis roll back during the lifting movement.

## 2017-06-02 ENCOUNTER — Ambulatory Visit: Payer: PPO | Admitting: Physical Therapy

## 2017-06-02 ENCOUNTER — Encounter: Payer: Self-pay | Admitting: Physical Therapy

## 2017-06-02 DIAGNOSIS — M6281 Muscle weakness (generalized): Secondary | ICD-10-CM | POA: Diagnosis not present

## 2017-06-02 DIAGNOSIS — M25551 Pain in right hip: Secondary | ICD-10-CM

## 2017-06-02 DIAGNOSIS — Z9181 History of falling: Secondary | ICD-10-CM

## 2017-06-02 DIAGNOSIS — R2689 Other abnormalities of gait and mobility: Secondary | ICD-10-CM

## 2017-06-02 DIAGNOSIS — R2681 Unsteadiness on feet: Secondary | ICD-10-CM

## 2017-06-02 NOTE — Therapy (Signed)
[facility] [address] [place], [place], [address] Phone: [phone]   Fax:  [phone]  Physical Therapy Treatment  Patient Details  Name: Emily Moon MRN: [id number] Date of Birth: [birth year] Referring Provider: Owens Loffler MD   Encounter Date: 06/02/2017      PT End of Session - 06/02/17 1453    Visit Number 3   Number of Visits 18   Date for PT Re-Evaluation 07/23/17   Authorization Type [employer] G-code   PT Start Time 1400   PT Stop Time 1445   PT Time Calculation (min) 45 min   Activity Tolerance Patient tolerated treatment well   Behavior During Therapy [facility] for tasks assessed/performed      Past Medical History:  Diagnosis Date  . Arthritis    osteo  . Asthma   . Bipolar disorder ([name]) 05/21/14  . Cataracts, bilateral   . DDD (degenerative disc disease), cervical    also back  . Depression   . Dizziness    Positional  . Edema    feet/legs  . Fibromyalgia syndrome   . Fungal infection    Finger nails  . GERD (gastroesophageal reflux disease)   . Gout   . Headache    seasonal allergies  . Heart palpitations   . 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 Hypercholesterolemia   . Multiple sclerosis (HCC)    weakness  . Nephrolithiasis    kidney stones  . Osteoporosis    osteoarthritis  . Pneumonia   . PONV (postoperative nausea and vomiting)    no problem after cataract surgery  . Psoriasis   . Renal stone   . Shortness of breath dyspnea    wheezing  . Sleep apnea 2012   sleep study / slight, no interventions  . Urinary frequency     Past Surgical History:  Procedure Laterality Date  . CATARACT EXTRACTION W/PHACO Left 05/21/2015   Procedure: CATARACT EXTRACTION PHACO AND INTRAOCULAR LENS PLACEMENT (IOC);  Surgeon: Birder Robson, MD;  Location: [facility];  Service: Ophthalmology;  Laterality: Left;  [place] 00:35 AP% 22.9 CDE 8.11 fluid pack lot  #[id number]  . CATARACT EXTRACTION W/PHACO Right 06/04/2015   Procedure: CATARACT EXTRACTION PHACO AND INTRAOCULAR LENS PLACEMENT (IOC);  Surgeon: Birder Robson, MD;  Location: [facility];  Service: Ophthalmology;  Laterality: Right;  US:00:48 AP%: 10.5 CDE:5.08 Fluid lot #[id number] H  . CYSTOSCOPY/URETEROSCOPY/HOLMIUM LASER/STENT PLACEMENT Bilateral 09/22/2016   Procedure: CYSTOSCOPY/URETEROSCOPY/HOLMIUM LASER/STENT PLACEMENT;  Surgeon: Hollice Espy, MD;  Location: [facility];  Service: Urology;  Laterality: Bilateral;  . EYE SURGERY  2015   tissue biopsy  . FOOT SURGERY  2015  . JOINT REPLACEMENT Left 2013   hip replacement  . LITHOTRIPSY    . PTOSIS REPAIR Bilateral 02/18/2016   Procedure: BILATERAL PTOSIS REPAIR UPPER EYELIDS;  Surgeon: Karle Starch, MD;  Location: [facility];  Service: Ophthalmology;  Laterality: Bilateral;  LEAVE PT EARLY AM  . thumb surgery Right   . TONSILLECTOMY  1973    There were no vitals filed for this visit.      Subjective Assessment - 06/02/17 1401    Subjective Patient reports her pain is less today and is able to walk around without her cane. Patient also reports that she is having trouble with some of the exercises at home and asked for more instructions. She is helping her niece with wedding planning is having to do some manual labor that she is worried might aggravate  her condition.             Patient Stated Goals to get my RLE strong again, walk without reliance on cane    Currently in Pain? Yes   Pain Score 3    Pain Location Hip   Pain Orientation Right   Pain Descriptors / Indicators Throbbing;Radiating   Pain Type Chronic pain   Pain Onset 1 to 4 weeks ago   Pain Frequency Constant   Aggravating Factors  standing, bearing weight    Pain Relieving Factors unewighting, medication    Multiple Pain Sites No             [facility] Adult PT Treatment/Exercise - 06/02/17 1457      Exercises   Exercises Knee/Hip   Other Exercises  R Hip  stretches and strengthening exercises, Core strengthening exercises performed for selection for HEP     Knee/Hip Exercises: Stretches   Active Hamstring Stretch Right;60 seconds;2 reps  supine, used towel around foot to facilitate stretch    ITB Stretch Right;2 reps;30 seconds;Limitations   ITB Stretch Limitations atttempted standing ITB stretch against wall, took patient adjusting position to feel stretch in correct area of thigh; left side lying ITB stretch with R leg hanging off side of mat     Piriformis Stretch Right;2 reps;60 seconds;Limitations   Piriformis Stretch Limitations patient adjusted position to feel stretch in intended area    Other Knee/Hip Stretches towel assisted abductor, adductor stretch in supine, 2 x30 sec each, verbal cues with tactile cues to help adjust stretch to correct area of thigh      Knee/Hip Exercises: Supine   Bridges Strengthening;Both;1 set;10 reps   Bridges Limitations emphasis on core and glute engagement    Straight Leg Raises Strengthening;Right;1 set;10 reps   Straight Leg Raises Limitations patient initated SLR with pulses at end of motion instead of touching down to mat      Knee/Hip Exercises: Sidelying   Hip ABduction Strengthening;Right;1 set;10 reps   Clams with yellow theraband around thighs     Knee/Hip Exercises: Prone   Hip Extension Strengthening;Right;1 set;10 reps  leg straight    Hip Extension Limitations prone position hurts pt's low back, she completed exercise while laying prone with no back extension due to pillow at abdomen             PT Education - 06/02/17 1452    Education provided Yes   Education Details reviewed HEP and modified/added exercises pre patient's request    Person(s) Educated Patient   Methods Explanation;Demonstration;Handout;Verbal cues   Comprehension Verbalized understanding;Returned demonstration;Verbal cues required          PT Short Term Goals - 05/24/17 1343      PT SHORT TERM GOAL  #1   Title Patient will verbalize understanding and return demonstration for initial HEP to increase LE strength and flexibility to decrease pain in R hip. (TARGET DATE: 06/25/2017)    Time 1   Period Months   Status New     PT SHORT TERM GOAL #2   Title Patient will report R hip pain as </= 4-5/10 when performing bed mobility including rolling to both sides and transferring supine <> sit to indicate improvement in R hip pain. (TARGET DATE: 06/25/2017)    Time 1   Period Months   Status New     PT SHORT TERM GOAL #3   Title Patient reports >25% improvement in right hip pain with standing & gait activities. (Target Date:  06/25/2017)   Time 1   Period Months   Status New     PT SHORT TERM GOAL #4   Title Patient's 90/90 hamstring measurement on R side will be improve to >/= -12* from full knee extension with reports of an increase in pain as </=3 points higher than her baseline report of pain (on a 10 point pain scale) to indicate improvement in LE flexibility and a decrease in pain. (TARGET DATE: 06/25/2017)    Time 1   Period Months   Status New           PT Long Term Goals - 05/24/17 1334      PT LONG TERM GOAL #1   Title Patient will verbalize understanding and return demonstration of ongoing HEP to increase LE strength and flexibility to decrease R hip pain and risk of falling. (TARGET DATE: 07/23/2017)    Time 2   Period Months   Status New     PT LONG TERM GOAL #2   Title Patient will report R hip pain as </= 3/10 when performing bed mobility including rolling to both sides and transferring supine <> sit to indicate improvement in R hip pain. (TARGET DATE: 07/23/2017)    Time 2   Period Months   Status New     PT LONG TERM GOAL #3   Title Patient reports >50% improvement in pain with standing & gait activities. (Target Date: 07/23/2017)   Time 2   Period Months   Status New     PT LONG TERM GOAL #4   Title Patient's 90/90 hamstring measurement on R side will be improve to  >/= -8* from full knee extension without an increase in pain to indicate improvement in LE flexibility and a decrease in pain. (TARGET DATE: 07/23/2017)    Time 2   Period Months   Status New     PT LONG TERM GOAL #5   Title Patient reports a >10% improvement in ABC scale using FOTO (TARGET DATE: 07/23/2017)    Time 2   Period Months   Status New               Plan - 06/02/17 1534    Clinical Impression Statement Today's session focused on reviewing HEP patient recieved a week ago. She was having trouble following HEP directions and completeing exercises at home. We created a new HEP with modified exercises that we completed in session today. Each exercise took several attempts to find position that was both comfortable for the patient and stretched the correct muscle. Patient would benefit from continued PT to address unmet goals.                                                                  Rehab Potential Good   Clinical Impairments Affecting Rehab Potential bi-polar disorder, history of L THA  2013, OA needs Right THA, gout, DDD, chronic pain/fibromyalgia, MS, lower leg nuumbness, positional dizziness, osteoporosis, kidney stones   PT Frequency 2x / week   PT Duration Other (comment)   PT Treatment/Interventions ADLs/Self Care Home Management;DME Instruction;Gait training;Stair training;Functional mobility training;Therapeutic activities;Therapeutic exercise;Balance training;Neuromuscular re-education;Patient/family education   PT Next Visit Plan Continue focus on LE strengthening and flexiblity to relieve R hip pain and  increase functional independence   Consulted and Agree with Plan of Care Patient      Patient will benefit from skilled therapeutic intervention in order to improve the following deficits and impairments:  Abnormal gait, Decreased activity tolerance, Decreased balance, Decreased endurance, Decreased knowledge of precautions, Decreased mobility, Decreased strength,  Dizziness, Impaired flexibility, Postural dysfunction, Difficulty walking, Pain  Visit Diagnosis: Muscle weakness (generalized)  Pain in right hip  History of falling  Unsteadiness on feet  Other abnormalities of gait and mobility     Problem List Patient Active Problem List   Diagnosis Date Noted  . Cough 03/30/2017  . Urinary frequency 03/30/2017  . B12 deficiency 05/22/2016  . Dizziness 03/16/2016  . Medicare annual wellness visit, subsequent 10/25/2015  . Back pain 10/31/2014  . Bipolar disorder ([place]) 05/21/2014  . 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 Insomnia 09/01/2011  . Obesity 05/04/2011  . HLD (hyperlipidemia) 01/20/2011  . SMOKER 01/20/2011  . Chronic pain syndrome 01/07/2011  . RENAL CALCULUS, RECURRENT 11/13/2010  . MULTIPLE SCLEROSIS, PROGRESSIVE/RELAPSING 08/28/2010  . GERD 08/28/2010  . Osteoarthritis 08/28/2010  . NEPHROLITHIASIS, HX OF 08/28/2010    Skip Estimable, SPTA 06/02/2017, 4:38 PM  [facility] [address] [place], [place], [address] Phone: [phone]   Fax:  [phone]  Name: SEDONA WENK MRN: [id number] Date of Birth: 1961/10/22  This note has been reviewed and edited by supervising CI.  Willow Ora, PTA, [facility] [address] [place], [place] [address] [phone] 06/02/17, 11:51 PM

## 2017-06-02 NOTE — Patient Instructions (Addendum)
Piriformis Stretch, Sitting    Sit, one ankle on opposite knee, same-side hand on crossed knee. Push down on knee, keeping spine straight. Lean torso forward, with flat back, until tension is felt in hamstrings and gluteals of crossed-leg side. Hold _15__ seconds.  Repeat _3__ times per session. Do _2-3__ sessions per day.  Copyright  VHI. All rights reserved.    Supine: Leg Stretch With Strap (Basic)    Lie on back with one knee bent, foot flat on floor. Hook towel/sheet around other foot. Straighten knee.  Hold _30_ seconds. Relax leg completely down to floor. Repeat __3_ times per session. Do __1-2_ sessions per day.  Copyright  VHI. All rights reserved.   Hip Adductor With Hip Flexed 90 Degrees    Wrap towel/sheet around left foot, hold in same-side hand. Lift leg straight up and then lower leg down to side until a stretch is felt on the inside of thigh. Use both arms as needed to control leg. Keep opposite leg still. Do not rotate pelvis or trunk. Hold __30 seconds. Relax leg.   Repeat __3_ times.  Copyright  VHI. All rights reserved.    Hip Abductor With Hip Flexed 90 Degrees    Wrap towel/sheet around left foot, hold in opposite-side hand. Use sheet to bring leg up and the lower leg across body/other leg until a stretch is felt on the outside of thigh/hip. Hold _30_ seconds. Relax leg after hold. Can use both arms to hold sheet as needed. Repeat _3__ times.  Copyright  VHI. All rights reserved.     PELVIC TILT - SUPINE  Lie on your back with your knees bent. Next, arch your low back and then flatten it repeatedly. Your pelvis should tilt forward and back during the movement. Move through a comfortable range of motion. Perform 5-10 times daily.    CLAM SHELLS  While lying on your side with your knees bent and yellow band around legs just above knees, draw up the top knee while keeping contact of your feet together. Perform 10 times, 1-2 times daily.  Do not  let your pelvis roll back during the lifting movement.     Bridge    Lie back, with arms across chest. Lift hips up and hold for 5 seconds. Slowly lower back down. Perform 10 reps. 1-2 times a day. http://pm.exer.us/55   Copyright  VHI. All rights reserved.    Strengthening: Hip Abductor - Resisted    Lying flat: With band looped around both legs above knees, push thighs apart. Hold for 5 seconds.  Repeat _10_ times per set. Do _1_ sets per session. Do _1-2_ sessions per day.  http://orth.exer.us/688   Copyright  VHI. All rights reserved.

## 2017-06-03 ENCOUNTER — Ambulatory Visit: Payer: Self-pay

## 2017-06-04 ENCOUNTER — Encounter (HOSPITAL_COMMUNITY): Payer: Self-pay | Admitting: Psychiatry

## 2017-06-04 ENCOUNTER — Ambulatory Visit (INDEPENDENT_AMBULATORY_CARE_PROVIDER_SITE_OTHER): Payer: PPO | Admitting: Psychiatry

## 2017-06-04 VITALS — BP 122/70 | HR 97 | Ht 69.0 in | Wt 212.4 lb

## 2017-06-04 DIAGNOSIS — F3160 Bipolar disorder, current episode mixed, unspecified: Secondary | ICD-10-CM

## 2017-06-04 DIAGNOSIS — F17210 Nicotine dependence, cigarettes, uncomplicated: Secondary | ICD-10-CM | POA: Diagnosis not present

## 2017-06-04 MED ORDER — TEMAZEPAM 30 MG PO CAPS: ORAL_CAPSULE | ORAL | 3 refills | Status: DC

## 2017-06-04 MED ORDER — LAMOTRIGINE 150 MG PO TABS: 300.0000 mg | ORAL_TABLET | Freq: Every day | ORAL | 5 refills | Status: DC

## 2017-06-04 MED ORDER — HYDROXYZINE PAMOATE 50 MG PO CAPS: 100.0000 mg | ORAL_CAPSULE | Freq: Every day | ORAL | 5 refills | Status: DC

## 2017-06-04 MED ORDER — DULOXETINE HCL 20 MG PO CPEP: 40.0000 mg | ORAL_CAPSULE | Freq: Every day | ORAL | 5 refills | Status: DC

## 2017-06-04 MED ORDER — ZOLPIDEM TARTRATE 10 MG PO TABS: 10.0000 mg | ORAL_TABLET | Freq: Every day | ORAL | 0 refills | Status: DC

## 2017-06-04 NOTE — Progress Notes (Signed)
Psychiatric Initial Adult Assessment   Patient Identification: Emily Moon MRN:  [id number] Date of Evaluation:  06/04/2017 Referral Source: From the community Chief Complaint:    Visit Diagnos  bipolar disorder Today the patient is actually doing quite well. He is calmer. He seems less irritable. She denies persistent daily depression. She sleeps fairly well but takes a lot of sleeping medicine. She typically takes at least 15 mg of Ambien. She's eating well. She's got good energy. She recently finished a book she wrote. She's now writing another book. Patient denies any psychotic symptoms. The patient's multiple sclerosis is worsening and that she feels weaker. Her energy level is less. She's having trouble walking more so. Mainly involves her right leg. Patient likes to stay in. She avoids things. She denies the use of alcohol or drugs. The patient takes a magnesium preparation that helps her spasms legs. Patient's interpersonal relationships have problems in terms of her her relationship with her daughter. Her daughter-in-law blocks her ability to get to see her grandson who is about 10 months old. Her granddaughter and her son both moving to [place]. Therefore they're not to be all that available here at the unrelated acetaminophen as typhlitis. Overall the patient is stable.  Associated Signs/Symptoms: Depression Symptoms:  hopelessness, (Hypo) Manic Symptoms:   Anxiety Symptoms:   Psychotic Symptoms:   PTSD Symptoms:   Past Psychiatric History: The patient was seen in a psychiatric office and takes multiple medications she's not been recently hospitalized.  Previous Psychotropic Medications: Yes   Substance Abuse History in the last 12 months:  No.  Consequences of Substance Abuse: Negative  Past Medical History:  Past Medical History:  Diagnosis Date  . Arthritis    osteo  . Asthma   . Bipolar disorder ([place]) 05/21/14  . Cataracts, bilateral   . DDD (degenerative disc  disease), cervical    also back  . Depression   . Dizziness    Positional  . Edema    feet/legs  . Fibromyalgia syndrome   . Fungal infection    Finger nails  . GERD (gastroesophageal reflux disease)   . Gout   . Headache    seasonal allergies  . Heart palpitations   . 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 Hypercholesterolemia   . Multiple sclerosis ([facility])    weakness  . Nephrolithiasis    kidney stones  . Osteoporosis    osteoarthritis  . Pneumonia   . PONV (postoperative nausea and vomiting)    no problem after cataract surgery  . Psoriasis   . Renal stone   . Shortness of breath dyspnea    wheezing  . Sleep apnea 2012   sleep study / slight, no interventions  . Urinary frequency     Past Surgical History:  Procedure Laterality Date  . CATARACT EXTRACTION W/PHACO Left 05/21/2015   Procedure: CATARACT EXTRACTION PHACO AND INTRAOCULAR LENS PLACEMENT (IOC);  Surgeon: Birder Robson, MD;  Location: [facility];  Service: Ophthalmology;  Laterality: Left;  [place] 00:35 AP% 22.9 CDE 8.11 fluid pack lot #[id number]  . CATARACT EXTRACTION W/PHACO Right 06/04/2015   Procedure: CATARACT EXTRACTION PHACO AND INTRAOCULAR LENS PLACEMENT (IOC);  Surgeon: Birder Robson, MD;  Location: [facility];  Service: Ophthalmology;  Laterality: Right;  US:00:48 AP%: 10.5 CDE:5.08 Fluid lot #[id number]  . CYSTOSCOPY/URETEROSCOPY/HOLMIUM LASER/STENT PLACEMENT Bilateral 09/22/2016   Procedure: CYSTOSCOPY/URETEROSCOPY/HOLMIUM LASER/STENT PLACEMENT;  Surgeon: Hollice Espy, MD;  Location: [facility];  Service: Urology;  Laterality: Bilateral;  . EYE SURGERY  2015  tissue biopsy  . FOOT SURGERY  2015  . JOINT REPLACEMENT Left 2013   hip replacement  . LITHOTRIPSY    . PTOSIS REPAIR Bilateral 02/18/2016   Procedure: BILATERAL PTOSIS REPAIR UPPER EYELIDS;  Surgeon: Karle Starch, MD;  Location: [place];  Service: Ophthalmology;  Laterality: Bilateral;  LEAVE PT EARLY AM  . thumb surgery Right    . TONSILLECTOMY  1973    Family Psychiatric History:   Family History:  Family History  Problem Relation Age of Onset  . Cancer Father        Abdomen with mastasis  . Cancer Mother   . Heart disease Mother   . Kidney disease Neg Hx   . Bladder Cancer Neg Hx   . Prostate cancer Neg Hx     Social History:   Social History   Social History  . Marital status: Divorced    Spouse name: N/A  . Number of children: 1  . Years of education: N/A   Occupational History  . 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 Smoking status: Former Smoker    Years: 25.00    Types: Cigarettes    Quit date: 11/16/2012  . Smokeless tobacco: Never Used     Comment: occasional use  . Alcohol use No  . Drug use: Yes    Types: Methylphenidate  . Sexual activity: No   Other Topics Concern  . None   Social History Narrative  . None    Additional Social History:   Allergies:   Allergies  Allergen Reactions  . Albuterol Shortness Of Breath and Other (See Comments)    Makes pt feel jittery/ tacycardic  . Crestor [Rosuvastatin] Other (See Comments)    Joint pain, muscle pain, and hair loss  . Halcion [Triazolam] Other (See Comments)    Dizziness,headaches,bladder problems  . Levaquin [Levofloxacin In D5w] Diarrhea and Itching    Shoulder pain  . Naproxen Sodium Swelling    Patient tolerates in small doses  . Tylenol [Acetaminophen] Swelling    Patient tolerates in small doses  . Cefaclor Other (See Comments)    Doesn't remember---unsure if actually allergic   . Diclofenac Sodium Other (See Comments)    "made very sick"  . Sulfa Antibiotics Itching    Unsure of reaction possibly itching  . Tramadol Itching and Nausea And Vomiting  . Aripiprazole Other (See Comments)    Muscle tension/cramping  . Ibuprofen Swelling    Patient tolerates in small doses    Metabolic Disorder Labs: Lab Results  Component Value Date   HGBA1C 6.1 03/18/2017   No  results found for: PROLACTIN Lab Results  Component Value Date   CHOL 316 (H) 03/18/2017   TRIG 269.0 (H) 03/18/2017   HDL 50.60 03/18/2017   CHOLHDL 6 03/18/2017   VLDL 53.8 (H) 03/18/2017   LDLCALC 223 (H) 11/13/2014     Current Medications: Current Outpatient Prescriptions  Medication Sig Dispense Refill  . ASPERCREME LIDOCAINE EX Apply 1 application topically 4 (four) times daily as needed (for pain.).     Marland Kitchen aspirin 325 MG tablet Take 325 mg by mouth every 4 (four) hours as needed for headache.     . Biotin 10000 MCG TABS Take 1 tablet by mouth daily.    . calcipotriene-betamethasone (TACLONEX) ointment Apply 1 application topically daily as needed (for psorasis).     . Cholecalciferol (VITAMIN D3) 10000 units capsule Take 10,000 Units by  mouth daily.    . clobetasol cream (TEMOVATE) 3.41 % Apply 1 application topically 2 (two) times daily. 30 g 2  . cyanocobalamin (,VITAMIN B-12,) 1000 MCG/ML injection Inject 1,000 mcg into the muscle every 30 (thirty) days. For 3 Months (June, July, August)    . diclofenac sodium (VOLTAREN) 1 % GEL Apply 4 g topically 4 (four) times daily. (Patient taking differently: Apply 4 g topically 4 (four) times daily as needed (for pain.). ) 500 g 5  . docusate sodium (COLACE) 100 MG capsule Take 1 capsule (100 mg total) by mouth 2 (two) times daily. (Patient taking differently: Take 100 mg by mouth 2 (two) times daily as needed. ) 60 capsule 0  . DULoxetine (CYMBALTA) 20 MG capsule Take 2 capsules (40 mg total) by mouth at bedtime. 60 capsule 5  . HYDROcodone-acetaminophen (NORCO) 10-325 MG tablet Take 1 tablet by mouth every 6 (six) hours as needed for moderate pain or severe pain. 120 tablet 0  . HYDROcodone-acetaminophen (NORCO) 10-325 MG tablet Take 1 tablet by mouth every 6 (six) hours as needed for severe pain. 120 tablet 0  . HYDROcodone-acetaminophen (NORCO) 10-325 MG tablet Take 1 tablet by mouth every 6 (six) hours as needed for severe pain. 120  tablet 0  . hydroquinone 4 % cream 1 APPLICATION TOPICALLY DAILY AS NEEDED FOR BLEMISHES.  3  . hydrOXYzine (VISTARIL) 50 MG capsule Take 2 capsules (100 mg total) by mouth at bedtime. 90 capsule 5  . lamoTRIgine (LAMICTAL) 150 MG tablet Take 2 tablets (300 mg total) by mouth at bedtime. 60 tablet 5  . levalbuterol (XOPENEX HFA) 45 MCG/ACT inhaler Inhale 1-2 puffs into the lungs every 6 (six) hours as needed for wheezing. 1 Inhaler 5  . Lysine 1000 MG TABS Take 2,000-4,000 mg by mouth at bedtime. 2000 mg scheduled at bedtime and patient will take 4000 mg if she has outbreak    . mirabegron ER (MYRBETRIQ) 25 MG TB24 tablet Take 1 tablet (25 mg total) by mouth daily. 90 tablet 0  . neomycin-bacitracin-polymyxin (NEOSPORIN) [phone] ointment Apply 1 application topically 4 (four) times daily as needed (for cut/scrapes.).    Marland Kitchen nicotine (NICODERM CQ - DOSED IN MG/24 HOURS) 21 mg/24hr patch Place 1 patch (21 mg total) onto the skin daily. 28 patch 3  . phenazopyridine (AZO-TABS) 95 MG tablet Take 95 mg by mouth 3 (three) times daily as needed for pain.    . polyethylene glycol powder (GLYCOLAX/MIRALAX) powder MIX 17 GRAMS (1 CAPFUL) WITH 4-8 OZ OF LIQUID AND TAKE BY MOUTH TWICE DAILY AS NEEDED (Patient taking differently: MIX 17 GRAMS (1 CAPFUL) WITH 4-8 OZ OF LIQUID AND TAKE BY MOUTH TWICE DAILY AS NEEDED FOR CONSTIPATION) 527 g 0  . temazepam (RESTORIL) 30 MG capsule TAKE ONE TO TWO CAPSULES BY MOUTH NIGHTLY AT BEDTIME 60 capsule 3  . tiZANidine (ZANAFLEX) 4 MG tablet Take 2 tablets (8 mg total) by mouth every 6 (six) hours as needed for muscle spasms. 720 tablet 3  . zolpidem (AMBIEN) 10 MG tablet Take 1 tablet (10 mg total) by mouth at bedtime. 135 tablet 0  . ketorolac (TORADOL) 10 MG tablet Take 1 tablet (10 mg total) by mouth 2 (two) times daily as needed for severe pain. (Patient not taking: Reported on 06/04/2017) 60 tablet 0  . ondansetron (ZOFRAN ODT) 4 MG disintegrating tablet Take 1 tablet (4  mg total) by mouth every 8 (eight) hours as needed for nausea. (Patient not taking: Reported on 06/04/2017)  12 tablet 0  . tamsulosin (FLOMAX) 0.4 MG CAPS capsule TAKE 1 CAPSULE (0.4 MG TOTAL) BY MOUTH DAILY. (Patient not taking: Reported on 06/04/2017) 30 capsule 0   No current facility-administered medications for this visit.     Neurologic: Headache: No Seizure: No Paresthesias:No  Musculoskeletal: Strength & Muscle Tone: within normal limits Gait & Station: normal Patient leans: N/A  Psychiatric Specialty Exam: ROS  Blood pressure 122/70, pulse 97, height 5\' 9"  (1.753 m), weight 212 lb 6.4 oz (96.3 kg), last menstrual period 08/23/2014.Body mass index is 31.37 kg/m.  General Appearance: Casual  Eye Contact:  Good  Speech:  Clear and Coherent  Volume:  Normal  Mood:  Negative  Affect:  Appropriate  Thought Process:  Goal Directed  Orientation:  NA  Thought Content:  Logical  Suicidal Thoughts:  No  Homicidal Thoughts:  No  Memory:  Negative  Judgement:  Good  Insight:  Good  Psychomotor Activity:  Normal  Concentration:    Recall:    Fund of Knowledge:Good  Language: Good  Akathisia:  No  Handed:  Right  AIMS (if indicated):    Assets:  Desire for Improvement  ADL's:  Intact  Cognition: WNL  Sleep:      7/20/20189:02 AM   At this time the patient will continue taking Lamictal 150 mg take 2 of them. She'll continue taking Cymbalta 20 mg 2 a day. She'll continue taking Ambien 10 for 15 mg at night to continue 30 mg of Restoril. Patient also takes Vistaril regular. She says all of these medicines help her. She is not oversedated. Her mood is relatively even. The patient seems to have characterological component to her but I think this is more related to chronic personality features and not really a disorder. The patient does have some interpersonal conflicts with some people. She's been tense with me in the past but today fluid and easy-going. Patient sees a specialist  for MS at [facility]. 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 Emotionally the patient is fairly stable. She's not suicidal. She denies chest pain or shortness of breath. She has no focal significant deficits from her multiple sclerosis. Seems producing a generalized weakness. Presently the patient is in physical therapy, occupational therapy and speech therapy. The patient return to see me in 4 months for a 30 minute visit

## 2017-06-07 ENCOUNTER — Encounter: Payer: Self-pay | Admitting: Primary Care

## 2017-06-07 ENCOUNTER — Ambulatory Visit: Payer: PPO | Admitting: Physical Therapy

## 2017-06-07 NOTE — Progress Notes (Signed)
06/08/2017 8:52 AM   [name] 03-24-1961 [id number]  Referring provider: [name], NP [place], [place] [address]  Chief Complaint  Patient presents with  . Urinary Frequency    need med refill myrbetriq    HPI: Patient is a 56 year old Caucasian female with a history of multiple sclerosis and a history of nephrolithiasis who presents today requesting a refill on a medication.  Background history 56 year old female with a history of multiple sclerosis with bilateral nephrolithiasis who underwent bilateral ureteroscopy to treat a small left ureteral stone as well as an 8 mm right UPJ stone on 09/22/2016.  Her stents were subsequently removed on 10/07/2016. Unfortunately, she presented to the emergency room on 10/10/2016 with high fevers.  She continued to spike fevers for multiple days but had a somewhat prolonged admission. She ultimately grew enterococcus faecalis in her urine, only 20,000 colonies.  Blood cultures were negative.  She was treated with a 21 day course of amoxicillin.  She did have imaging in the form of CT renal stone on 10/10/2016 which showed no hydronephrosis bilaterally and no obstructing ureteral calculi. She did have a 3 mm right lower pole stone, otherwise no significant residual stone burden.  She does have a personal history of stones.  She was hospitalized with 2008 with impacted left ureteral stone s/p septic shock/ percutaneous nephrostomy tube.  Ultimately had EWSL and stent.    Patient was given Myrbetriq while she had her stents in place for the discomfort.  She has since continued the medication for her frequency.    She is overdue for a KUB and is in need of a 24 hour urine workup.  She is not having flank pain or gross hematuria and this time.  She is not having fevers, chills, nausea or vomiting.    Today, the patient has been experiencing urgency x 0-3, frequency x 8 or more, is restricting fluids to avoid visits to the  restroom, is engaging in toilet mapping, incontinence x 0-3 and nocturia x 0-3.  Her PVR was 62 mL.  She is in the donut hole currently and would like samples of Myrbetriq as she cannot afford the out of pocket cost.    PMH: Past Medical History:  Diagnosis Date  . Arthritis    osteo  . Asthma   . Bipolar disorder ([place]) 05/21/14  . Cataracts, bilateral   . DDD (degenerative disc disease), cervical    also back  . Depression   . Dizziness    Positional  . Edema    feet/legs  . Fibromyalgia syndrome   . Fungal infection    Finger nails  . GERD (gastroesophageal reflux disease)   . Gout   . Headache    seasonal allergies  . Heart palpitations   . 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 Hypercholesterolemia   . Multiple sclerosis (HCC)    weakness  . Nephrolithiasis    kidney stones  . Osteoporosis    osteoarthritis  . Pneumonia   . PONV (postoperative nausea and vomiting)    no problem after cataract surgery  . Psoriasis   . Renal stone   . Shortness of breath dyspnea    wheezing  . Sleep apnea 2012   sleep study / slight, no interventions  . Urinary frequency     Surgical History: Past Surgical History:  Procedure Laterality Date  . CATARACT EXTRACTION W/PHACO Left 05/21/2015   Procedure: CATARACT EXTRACTION PHACO AND INTRAOCULAR LENS PLACEMENT (  [place]);  Surgeon: Birder Robson, MD;  Location: [facility];  Service: Ophthalmology;  Laterality: Left;  [place] 00:35 AP% 22.9 CDE 8.11 fluid pack lot #[id number]  . CATARACT EXTRACTION W/PHACO Right 06/04/2015   Procedure: CATARACT EXTRACTION PHACO AND INTRAOCULAR LENS PLACEMENT ([facility]);  Surgeon: Birder Robson, MD;  Location: [facility];  Service: Ophthalmology;  Laterality: Right;  US:00:48 AP%: 10.5 CDE:5.08 Fluid lot #[id number]  . CYSTOSCOPY/URETEROSCOPY/HOLMIUM LASER/STENT PLACEMENT Bilateral 09/22/2016   Procedure: CYSTOSCOPY/URETEROSCOPY/HOLMIUM LASER/STENT PLACEMENT;  Surgeon: Hollice Espy, MD;  Location: [facility];   Service: Urology;  Laterality: Bilateral;  . EYE SURGERY  2015   tissue biopsy  . FOOT SURGERY  2015  . JOINT REPLACEMENT Left 2013   hip replacement  . LITHOTRIPSY    . PTOSIS REPAIR Bilateral 02/18/2016   Procedure: BILATERAL PTOSIS REPAIR UPPER EYELIDS;  Surgeon: Karle Starch, MD;  Location: [facility];  Service: Ophthalmology;  Laterality: Bilateral;  LEAVE PT EARLY AM  . thumb surgery Right   . TONSILLECTOMY  1973    Home Medications:  Allergies as of 06/08/2017      Reactions   Albuterol Shortness Of Breath, Other (See Comments)   Makes pt feel jittery/ tacycardic   Crestor [rosuvastatin] Other (See Comments)   Joint pain, muscle pain, and hair loss   Halcion [triazolam] Other (See Comments)   Dizziness,headaches,bladder problems   Levaquin [levofloxacin In D5w] Diarrhea, Itching   Shoulder pain   Naproxen Sodium Swelling   Patient tolerates in small doses   Tylenol [acetaminophen] Swelling   Patient tolerates in small doses   Cefaclor Other (See Comments)   Doesn't remember---unsure if actually allergic    Diclofenac Sodium Other (See Comments)   "made very sick"   Sulfa Antibiotics Itching   Unsure of reaction possibly itching   Tramadol Itching, Nausea And Vomiting   Aripiprazole Other (See Comments)   Muscle tension/cramping   Ibuprofen Swelling   Patient tolerates in small doses      Medication List       Accurate as of 06/08/17 11:59 PM. Always use your most recent med list.          ASPERCREME LIDOCAINE EX Apply 1 application topically 4 (four) times daily as needed (for pain.).   aspirin 325 MG tablet Take 325 mg by mouth every 4 (four) hours as needed for headache.   AZO-TABS 95 MG tablet Generic drug:  phenazopyridine Take 95 mg by mouth 3 (three) times daily as needed for pain.   Biotin 10000 MCG Tabs Take 1 tablet by mouth daily.   calcipotriene-betamethasone ointment Commonly known as:  TACLONEX Apply 1 application topically  daily as needed (for psorasis).   clobetasol cream 0.05 % Commonly known as:  TEMOVATE Apply 1 application topically 2 (two) times daily.   cyanocobalamin 1000 MCG/ML injection Commonly known as:  (VITAMIN B-12) Inject 1,000 mcg into the muscle every 30 (thirty) days. For 3 Months (June, July, August)   diclofenac sodium 1 % Gel Commonly known as:  VOLTAREN Apply 4 g topically 4 (four) times daily.   docusate sodium 100 MG capsule Commonly known as:  COLACE Take 1 capsule (100 mg total) by mouth 2 (two) times daily.   DULoxetine 20 MG capsule Commonly known as:  CYMBALTA Take 2 capsules (40 mg total) by mouth at bedtime.   HYDROcodone-acetaminophen 10-325 MG tablet Commonly known as:  NORCO Take 1 tablet by mouth every 6 (six) hours as needed for moderate pain or severe pain.  HYDROcodone-acetaminophen 10-325 MG tablet Commonly known as:  NORCO Take 1 tablet by mouth every 6 (six) hours as needed for severe pain.   HYDROcodone-acetaminophen 10-325 MG tablet Commonly known as:  NORCO Take 1 tablet by mouth every 6 (six) hours as needed for severe pain.   hydroquinone 4 % cream 1 APPLICATION TOPICALLY DAILY AS NEEDED FOR BLEMISHES.   hydrOXYzine 50 MG capsule Commonly known as:  VISTARIL Take 2 capsules (100 mg total) by mouth at bedtime.   ketorolac 10 MG tablet Commonly known as:  TORADOL Take 1 tablet (10 mg total) by mouth 2 (two) times daily as needed for severe pain.   lamoTRIgine 150 MG tablet Commonly known as:  LAMICTAL Take 2 tablets (300 mg total) by mouth at bedtime.   levalbuterol 45 MCG/ACT inhaler Commonly known as:  XOPENEX HFA Inhale 1-2 puffs into the lungs every 6 (six) hours as needed for wheezing.   Lysine 1000 MG Tabs Take 2,000-4,000 mg by mouth at bedtime. 2000 mg scheduled at bedtime and patient will take 4000 mg if she has outbreak   mirabegron ER 25 MG Tb24 tablet Commonly known as:  MYRBETRIQ Take 1 tablet (25 mg total) by mouth  daily.   neomycin-bacitracin-polymyxin [phone] ointment Apply 1 application topically 4 (four) times daily as needed (for cut/scrapes.).   nicotine 21 mg/24hr patch Commonly known as:  NICODERM CQ - dosed in mg/24 hours Place 1 patch (21 mg total) onto the skin daily.   ondansetron 4 MG disintegrating tablet Commonly known as:  ZOFRAN ODT Take 1 tablet (4 mg total) by mouth every 8 (eight) hours as needed for nausea.   polyethylene glycol powder powder Commonly known as:  GLYCOLAX/MIRALAX MIX 17 GRAMS (1 CAPFUL) WITH 4-8 OZ OF LIQUID AND TAKE BY MOUTH TWICE DAILY AS NEEDED   solifenacin 5 MG tablet Commonly known as:  VESICARE Take 1 tablet (5 mg total) by mouth daily.   tamsulosin 0.4 MG Caps capsule Commonly known as:  FLOMAX TAKE 1 CAPSULE (0.4 MG TOTAL) BY MOUTH DAILY.   temazepam 30 MG capsule Commonly known as:  RESTORIL TAKE ONE TO TWO CAPSULES BY MOUTH NIGHTLY AT BEDTIME   tiZANidine 4 MG tablet Commonly known as:  ZANAFLEX Take 2 tablets (8 mg total) by mouth every 6 (six) hours as needed for muscle spasms.   Vitamin D3 10000 units capsule Take 10,000 Units by mouth daily.   zolpidem 10 MG tablet Commonly known as:  AMBIEN Take 1 tablet (10 mg total) by mouth at bedtime.       Allergies:  Allergies  Allergen Reactions  . Albuterol Shortness Of Breath and Other (See Comments)    Makes pt feel jittery/ tacycardic  . Crestor [Rosuvastatin] Other (See Comments)    Joint pain, muscle pain, and hair loss  . Halcion [Triazolam] Other (See Comments)    Dizziness,headaches,bladder problems  . Levaquin [Levofloxacin In D5w] Diarrhea and Itching    Shoulder pain  . Naproxen Sodium Swelling    Patient tolerates in small doses  . Tylenol [Acetaminophen] Swelling    Patient tolerates in small doses  . Cefaclor Other (See Comments)    Doesn't remember---unsure if actually allergic   . Diclofenac Sodium Other (See Comments)    "made very sick"  . Sulfa  Antibiotics Itching    Unsure of reaction possibly itching  . Tramadol Itching and Nausea And Vomiting  . Aripiprazole Other (See Comments)    Muscle tension/cramping  . Ibuprofen Swelling  Patient tolerates in small doses    Family History: Family History  Problem Relation Age of Onset  . Cancer Father        Abdomen with mastasis  . Cancer Mother   . Heart disease Mother   . Kidney disease Neg Hx   . Bladder Cancer Neg Hx   . Prostate cancer Neg Hx     Social History:  reports that she quit smoking about 4 years ago. Her smoking use included Cigarettes. She quit after 25.00 years of use. She has never used smokeless tobacco. She reports that she uses drugs, including Methylphenidate. She reports that she does not drink alcohol.  ROS: UROLOGY Frequent Urination?: Yes Hard to postpone urination?: No Burning/pain with urination?: No Get up at night to urinate?: No Leakage of urine?: No Urine stream starts and stops?: No Trouble starting stream?: No Do you have to strain to urinate?: No Blood in urine?: No Urinary tract infection?: No Sexually transmitted disease?: No Injury to kidneys or bladder?: No Painful intercourse?: No Weak stream?: No Currently pregnant?: No Vaginal bleeding?: No Last menstrual period?: n  Gastrointestinal Nausea?: No Vomiting?: No Indigestion/heartburn?: Yes Diarrhea?: No Constipation?: No  Constitutional Fever: No Night sweats?: No Weight loss?: No Fatigue?: Yes  Skin Skin rash/lesions?: Yes Itching?: Yes  Eyes Blurred vision?: No Double vision?: No  Ears/Nose/Throat Sore throat?: No Sinus problems?: Yes  Hematologic/Lymphatic Swollen glands?: No Easy bruising?: No  Cardiovascular Leg swelling?: No Chest pain?: No  Respiratory Cough?: No Shortness of breath?: No  Endocrine Excessive thirst?: No  Musculoskeletal Back pain?: Yes Joint pain?: Yes  Neurological Headaches?: No Dizziness?:  No  Psychologic Depression?: Yes Anxiety?: Yes  Physical Exam: BP 111/73   Pulse 97   Ht 5\' 9"  (1.753 m)   Wt 210 lb (95.3 kg)   LMP 08/23/2014   BMI 31.01 kg/m   Constitutional: Well nourished. Alert and oriented, No acute distress. HEENT: [place] AT, moist mucus membranes. Trachea midline, no masses. Cardiovascular: No clubbing, cyanosis, or edema. Respiratory: Normal respiratory effort, no increased work of breathing. Skin: No rashes, bruises or suspicious lesions. Lymph: No cervical or inguinal adenopathy. Neurologic: Grossly intact, no focal deficits, moving all 4 extremities. Psychiatric: Normal mood and affect.  Laboratory Data: Lab Results  Component Value Date   WBC 7.2 10/14/2016   HGB 9.9 (L) 10/14/2016   HCT 29.2 (L) 10/14/2016   MCV 93.5 10/14/2016   PLT 275 10/14/2016    Lab Results  Component Value Date   CREATININE 0.85 12/17/2016    Lab Results  Component Value Date   HGBA1C 6.1 03/18/2017   I have reviewed the labs   Pertinent Imaging: Results for JAMIESON, LISA (MRN [id number]) as of 06/08/2017 14:36  Ref. Range 06/08/2017 14:23  Scan Result Unknown 62   Assessment & Plan:    1. Frequency   - given Vesicare samples   - will contact if receive Myrbetriq samples are received  - will inquire with her insurance to see if PTNS is covered  2. Kidney stones  - S/p bilateral ureteroscopy for obstructing stones complicated by febrile UTI requiring hospital admission  - Follow-up CT scan shows resolution of hydronephrosis, residual 3 mm right lower pole fragment, asymptomatic  - No Stone analysis for review  - patient will complete 24 hour urine    3. History of pyelonephritis Continues the antibiotics, has follow up with ID Given her history of urosepsis following urological procedures x 2, we will prophylax with  additional antibiotics in the future if needed   Return for [facility] results.  Zara Council, [facility] [address] [place], [place] [address] [phone]

## 2017-06-08 ENCOUNTER — Ambulatory Visit (INDEPENDENT_AMBULATORY_CARE_PROVIDER_SITE_OTHER): Payer: PPO | Admitting: Urology

## 2017-06-08 ENCOUNTER — Encounter: Payer: Self-pay | Admitting: Urology

## 2017-06-08 ENCOUNTER — Encounter: Payer: Self-pay | Admitting: Speech Pathology

## 2017-06-08 VITALS — BP 111/73 | HR 97 | Ht 69.0 in | Wt 210.0 lb

## 2017-06-08 DIAGNOSIS — R350 Frequency of micturition: Secondary | ICD-10-CM

## 2017-06-08 DIAGNOSIS — N200 Calculus of kidney: Secondary | ICD-10-CM

## 2017-06-08 DIAGNOSIS — Z87448 Personal history of other diseases of urinary system: Secondary | ICD-10-CM

## 2017-06-08 LAB — BLADDER SCAN AMB NON-IMAGING: SCAN RESULT: 62

## 2017-06-08 MED ORDER — SOLIFENACIN SUCCINATE 5 MG PO TABS: 5.0000 mg | ORAL_TABLET | Freq: Every day | ORAL | 0 refills | Status: DC

## 2017-06-08 NOTE — Therapy (Signed)
[facility] [address] [place], [place], [address] Phone: [phone]   Fax:  [phone]  Patient Details  Name: Emily Moon MRN: [id number] Date of Birth: 10/06/1961 Referring Provider:  No ref. provider found  Encounter Date: 06/08/2017  SPEECH THERAPY DISCHARGE SUMMARY  Visits from Start of Care: 6  Current functional level related to goals / functional outcomes:  See goals below      SLP Long Term Goals - 06/08/17 1315      SLP LONG TERM GOAL #1   Title Pt will carryover journaling/phone notes to recall pertinent daily information over 5 sessions with rare min A   Time 5   Period Weeks   Status Achieved     SLP LONG TERM GOAL #2   Title Pt will divide attention between 2 mildly complex cognitive linguistc tasks with 85% on each and occasional min A   Time 5   Period Weeks   Status Achieved     SLP LONG TERM GOAL #3   Title Pt will verbalize compensations for attention with rare min A   Time 5   Period Weeks   Status Partially met          Remaining deficits: High level cognition   Education / Equipment: Compensations for cognitive impairments;cognitive activities to do at home Plan: Patient agrees to discharge.  Patient goals were met. Patient is being discharged due to being pleased with the current functional level.  ?????        Lovvorn, Annye Rusk MS, CCC-SLP 06/08/2017, 1:15 PM  [facility] [address] [place], [place], [address] Phone: [phone]   Fax:  [phone]

## 2017-06-10 ENCOUNTER — Telehealth: Payer: Self-pay | Admitting: Urology

## 2017-06-10 NOTE — Telephone Encounter (Signed)
Please notify the Mrs. Moser that we have Myrbetriq samples.

## 2017-06-11 ENCOUNTER — Ambulatory Visit: Payer: Self-pay | Admitting: Physical Therapy

## 2017-06-11 NOTE — Telephone Encounter (Signed)
LMOM for patient to return call.

## 2017-06-14 ENCOUNTER — Telehealth: Payer: Self-pay | Admitting: Urology

## 2017-06-14 ENCOUNTER — Ambulatory Visit: Payer: Self-pay | Admitting: Physical Therapy

## 2017-06-14 NOTE — Telephone Encounter (Signed)
Samples left up front.

## 2017-06-14 NOTE — Telephone Encounter (Signed)
Pt came in and talked with [facility] about PTNS at last appt.  She was asking if insurance would cover it.  If so, she would like to begin mid-September.  Please give pt a call and let her know.

## 2017-06-15 ENCOUNTER — Ambulatory Visit: Payer: Self-pay | Admitting: Physical Therapy

## 2017-06-16 NOTE — Telephone Encounter (Signed)
Spoke with patient and plan is to see about PTNS and I will call her as soon as I know something. Patient ok with plan.

## 2017-06-21 ENCOUNTER — Other Ambulatory Visit (INDEPENDENT_AMBULATORY_CARE_PROVIDER_SITE_OTHER): Payer: PPO

## 2017-06-21 ENCOUNTER — Telehealth: Payer: Self-pay | Admitting: *Deleted

## 2017-06-21 DIAGNOSIS — E538 Deficiency of other specified B group vitamins: Secondary | ICD-10-CM | POA: Diagnosis not present

## 2017-06-21 NOTE — Telephone Encounter (Signed)
Spoke with [employer]. At [facility] [phone] and she states that CPT code [phone] for PTNS does not need prior authorization and is a covered benefit. Spoke with patient and she agrees to start PTNS on September 19th at 8:30 am and she understands that it is on Wednesdays at 8:30 am for 12 weeks.

## 2017-06-21 NOTE — Addendum Note (Signed)
Addended by: Marchia Bond on: 06/21/2017 10:17 AM   Modules accepted: Orders

## 2017-06-22 ENCOUNTER — Ambulatory Visit: Payer: PPO | Attending: Primary Care | Admitting: Physical Therapy

## 2017-06-22 ENCOUNTER — Encounter: Payer: Self-pay | Admitting: Physical Therapy

## 2017-06-22 DIAGNOSIS — R278 Other lack of coordination: Secondary | ICD-10-CM | POA: Diagnosis not present

## 2017-06-22 DIAGNOSIS — M25551 Pain in right hip: Secondary | ICD-10-CM | POA: Insufficient documentation

## 2017-06-22 DIAGNOSIS — R2681 Unsteadiness on feet: Secondary | ICD-10-CM | POA: Diagnosis not present

## 2017-06-22 DIAGNOSIS — M542 Cervicalgia: Secondary | ICD-10-CM | POA: Diagnosis not present

## 2017-06-22 DIAGNOSIS — R2689 Other abnormalities of gait and mobility: Secondary | ICD-10-CM | POA: Diagnosis not present

## 2017-06-22 DIAGNOSIS — Z9181 History of falling: Secondary | ICD-10-CM | POA: Insufficient documentation

## 2017-06-22 DIAGNOSIS — M6281 Muscle weakness (generalized): Secondary | ICD-10-CM | POA: Diagnosis not present

## 2017-06-22 LAB — VITAMIN B12: Vitamin B-12: 322 pg/mL (ref 200–1100)

## 2017-06-23 ENCOUNTER — Ambulatory Visit (INDEPENDENT_AMBULATORY_CARE_PROVIDER_SITE_OTHER): Payer: PPO | Admitting: Primary Care

## 2017-06-23 ENCOUNTER — Encounter: Payer: Self-pay | Admitting: Primary Care

## 2017-06-23 VITALS — BP 116/78 | HR 71 | Temp 98.1°F | Ht 69.0 in | Wt 211.1 lb

## 2017-06-23 DIAGNOSIS — M62838 Other muscle spasm: Secondary | ICD-10-CM | POA: Diagnosis not present

## 2017-06-23 DIAGNOSIS — E538 Deficiency of other specified B group vitamins: Secondary | ICD-10-CM

## 2017-06-23 DIAGNOSIS — Z79891 Long term (current) use of opiate analgesic: Secondary | ICD-10-CM | POA: Diagnosis not present

## 2017-06-23 DIAGNOSIS — M1990 Unspecified osteoarthritis, unspecified site: Secondary | ICD-10-CM | POA: Diagnosis not present

## 2017-06-23 DIAGNOSIS — Z23 Encounter for immunization: Secondary | ICD-10-CM

## 2017-06-23 DIAGNOSIS — F17200 Nicotine dependence, unspecified, uncomplicated: Secondary | ICD-10-CM | POA: Diagnosis not present

## 2017-06-23 DIAGNOSIS — E785 Hyperlipidemia, unspecified: Secondary | ICD-10-CM | POA: Diagnosis not present

## 2017-06-23 DIAGNOSIS — G35 Multiple sclerosis: Secondary | ICD-10-CM

## 2017-06-23 DIAGNOSIS — N200 Calculus of kidney: Secondary | ICD-10-CM

## 2017-06-23 MED ORDER — HYDROCODONE-ACETAMINOPHEN 10-325 MG PO TABS: 1.0000 | ORAL_TABLET | Freq: Four times a day (QID) | ORAL | 0 refills | Status: DC | PRN

## 2017-06-23 MED ORDER — CYANOCOBALAMIN 1000 MCG/ML IJ SOLN
1000.0000 ug | Freq: Once | INTRAMUSCULAR | Status: AC
  Administered 2017-06-23: 1000 ug via INTRAMUSCULAR

## 2017-06-23 NOTE — Assessment & Plan Note (Signed)
Managed on Norco 10-325 mg QID for osteoarthritis. History of multiple sclerosis. No suspicious activity on [facility] Controlled Substance Registry. She attends all required appointments. She meet criteria for continuous use of Norco. She is stable on her current regimen. Urine Drug Screen pending. Controlled Substance Contract updated today.

## 2017-06-23 NOTE — Assessment & Plan Note (Signed)
Following with Neurology through [facility]. Due for MRI and follow up later this fall.

## 2017-06-23 NOTE — Assessment & Plan Note (Signed)
Uncontrolled. Cannot tolerate any medications despite numerous attempts.  The 10-year ASCVD risk score Mikey Bussing DC Brooke Bonito., et al., 2013) is: 2.9%   Values used to calculate the score:     Age: 56 years     Sex: Female     Is Non-Hispanic African American: No     Diabetic: No     Tobacco smoker: No     Systolic Blood Pressure: 005 mmHg     Is BP treated: No     HDL Cholesterol: 50.6 mg/dL     Total Cholesterol: 316 mg/dL  Strongly advised against smoking. Repeat lipids in 3 months.

## 2017-06-23 NOTE — Patient Instructions (Signed)
Schedule a follow up visit in 3 months for re-evaluation. Come fasting so we can check your cholesterol. No food for 8 hours prior to this visit. You may have water.  I placed an add on order for physical therapy.   It was a pleasure to see you today!

## 2017-06-23 NOTE — Progress Notes (Signed)
Subjective:    Patient ID: Emily Moon, female    DOB: 07-Feb-1961, 56 y.o.   MRN: [id number]  HPI  [name] is a 56 year old female who presents today for follow up.  1) Osteoarthritis/Multiple Sclerosis: Currently managed on hydrocodone 10-325 mg for which she's taken for years. She is taking one tablet every 6 hours for pain, everyday. She's also taking tizanidine 8 mg every 6 hours. She is currently following with Neurology through [facility] for MS monitoring. She will be seeing her Neurologist later this year, should be getting an MRI.   Increased muscle spasms to right plantar foot, bilateral upper and lower extremities. Also to neck. She feels as though her MS is getting worse. She is working with physical therapy and would like for them to evaluate her neck given persistent spasms.    2) Hyperlipidemia: Long standing history of hyperlipidemia. Cannot tolerate statins, Zetia, Fish Oil. She is managed on aspirin 325 mg. She is not working on improvements in her diet and is not exercising.  The 10-year ASCVD risk score Emily Moon) is: 2.9%   Values used to calculate the score:     Age: 62 years     Sex: Female     Is Non-Hispanic African American: No     Diabetic: No     Tobacco smoker: No     Systolic Blood Pressure: 956 mmHg     Is BP treated: No     HDL Cholesterol: 50.6 mg/dL     Total Cholesterol: 316 mg/dL  3) Bipolar Disorder: Currently managed on Lamictal, Hydroxyzine, Cymbalta, Temazepam, Ambien. Following with psychiatry. Overall feels well managed. She uses the hydroxyzine sparingly.  4) Urinary Incontinence/Recurrent Kidney Stones: No recent renal stones. Managed on myrbetriq for incontinence and following with [facility]. She will be undergoing bladder procedure weekly for the next 12 weeks starting in September.   5) Vitamin B 12 Deficiency: Prescribed monthly B 12 injections. Recent B 12 lab of 322. She's not completing  monthly B 12 injections as prescribed. Her last B 12 injection was in May 2018. She is due today.  Review of Systems  Constitutional: Positive for fatigue.  Respiratory: Negative for shortness of breath.   Cardiovascular: Negative for chest pain.  Genitourinary: Negative for vaginal discharge.       Urinary incontinence   Musculoskeletal: Positive for arthralgias, myalgias and neck pain.  Psychiatric/Behavioral:       Following with psychiatry        Past Medical History:  Diagnosis Date  . Arthritis    osteo  . Asthma   . Bipolar disorder ([place]) 05/21/14  . Cataracts, bilateral   . DDD (degenerative disc disease), cervical    also back  . Depression   . Dizziness    Positional  . Edema    feet/legs  . Fibromyalgia syndrome   . Fungal infection    Finger nails  . GERD (gastroesophageal reflux disease)   . Gout   . Headache    seasonal allergies  . Heart palpitations   . 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 Hypercholesterolemia   . Multiple sclerosis (HCC)    weakness  . Nephrolithiasis    kidney stones  . Osteoporosis    osteoarthritis  . Pneumonia   . PONV (postoperative nausea and vomiting)    no problem after cataract surgery  . Psoriasis   . Renal stone   . Shortness of breath  dyspnea    wheezing  . Sleep apnea 2012   sleep study / slight, no interventions  . Urinary frequency      Social History   Social History  . Marital status: Divorced    Spouse name: N/A  . Number of children: 1  . Years of education: N/A   Occupational History  . 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 Smoking status: Former Smoker    Years: 25.00    Types: Cigarettes    Quit date: 11/16/2012  . Smokeless tobacco: Never Used     Comment: occasional use  . Alcohol use No  . Drug use: Yes    Types: Methylphenidate  . Sexual activity: No   Other Topics Concern  . Not on file   Social History Narrative  . No narrative on file     Past Surgical History:  Procedure Laterality Date  . CATARACT EXTRACTION W/PHACO Left 05/21/2015   Procedure: CATARACT EXTRACTION PHACO AND INTRAOCULAR LENS PLACEMENT (IOC);  Surgeon: Birder Robson, MD;  Location: [facility];  Service: Ophthalmology;  Laterality: Left;  [place] 00:35 AP% 22.9 CDE 8.11 fluid pack lot #[id number]  . CATARACT EXTRACTION W/PHACO Right 06/04/2015   Procedure: CATARACT EXTRACTION PHACO AND INTRAOCULAR LENS PLACEMENT (IOC);  Surgeon: Birder Robson, MD;  Location: [facility];  Service: Ophthalmology;  Laterality: Right;  US:00:48 AP%: 10.5 CDE:5.08 Fluid lot #[id number]  . CYSTOSCOPY/URETEROSCOPY/HOLMIUM LASER/STENT PLACEMENT Bilateral 09/22/2016   Procedure: CYSTOSCOPY/URETEROSCOPY/HOLMIUM LASER/STENT PLACEMENT;  Surgeon: Hollice Espy, MD;  Location: [facility];  Service: Urology;  Laterality: Bilateral;  . EYE SURGERY  2015   tissue biopsy  . FOOT SURGERY  2015  . JOINT REPLACEMENT Left Moon   hip replacement  . LITHOTRIPSY    . PTOSIS REPAIR Bilateral 02/18/2016   Procedure: BILATERAL PTOSIS REPAIR UPPER EYELIDS;  Surgeon: Karle Starch, MD;  Location: [place];  Service: Ophthalmology;  Laterality: Bilateral;  LEAVE PT EARLY AM  . thumb surgery Right   . TONSILLECTOMY  1973    Family History  Problem Relation Age of Onset  . Cancer Father        Abdomen with mastasis  . Cancer Mother   . Heart disease Mother   . Kidney disease Neg Hx   . Bladder Cancer Neg Hx   . Prostate cancer Neg Hx     Allergies  Allergen Reactions  . Albuterol Shortness Of Breath and Other (See Comments)    Makes pt feel jittery/ tacycardic  . Crestor [Rosuvastatin] Other (See Comments)    Joint pain, muscle pain, and hair loss  . Halcion [Triazolam] Other (See Comments)    Dizziness,headaches,bladder problems  . Levaquin [Levofloxacin In D5w] Diarrhea and Itching    Shoulder pain  . Naproxen Sodium Swelling    Patient tolerates in small doses  . Tylenol  [Acetaminophen] Swelling    Patient tolerates in small doses  . Cefaclor Other (See Comments)    Doesn't remember---unsure if actually allergic   . Diclofenac Sodium Other (See Comments)    "made very sick"  . Sulfa Antibiotics Itching    Unsure of reaction possibly itching  . Tramadol Itching and Nausea And Vomiting  . Aripiprazole Other (See Comments)    Muscle tension/cramping  . Ibuprofen Swelling    Patient tolerates in small doses    Current Outpatient Prescriptions on File Prior to Visit  Medication Sig Dispense Refill  . ASPERCREME LIDOCAINE EX Apply 1  application topically 4 (four) times daily as needed (for pain.).     Marland Kitchen aspirin 325 MG tablet Take 325 mg by mouth every 4 (four) hours as needed for headache.     . Biotin 10000 MCG TABS Take 1 tablet by mouth daily.    . calcipotriene-betamethasone (TACLONEX) ointment Apply 1 application topically daily as needed (for psorasis).     . Cholecalciferol (VITAMIN D3) 10000 units capsule Take 10,000 Units by mouth daily.    . clobetasol cream (TEMOVATE) 6.27 % Apply 1 application topically 2 (two) times daily. 30 g 2  . cyanocobalamin (,VITAMIN B-12,) 1000 MCG/ML injection Inject 1,000 mcg into the muscle every 30 (thirty) days. For 3 Months (June, July, August)    . diclofenac sodium (VOLTAREN) 1 % GEL Apply 4 g topically 4 (four) times daily. (Patient taking differently: Apply 4 g topically 4 (four) times daily as needed (for pain.). ) 500 g 5  . docusate sodium (COLACE) 100 MG capsule Take 1 capsule (100 mg total) by mouth 2 (two) times daily. (Patient taking differently: Take 100 mg by mouth 2 (two) times daily as needed. ) 60 capsule 0  . DULoxetine (CYMBALTA) 20 MG capsule Take 2 capsules (40 mg total) by mouth at bedtime. 60 capsule 5  . hydroquinone 4 % cream 1 APPLICATION TOPICALLY DAILY AS NEEDED FOR BLEMISHES.  3  . hydrOXYzine (VISTARIL) 50 MG capsule Take 2 capsules (100 mg total) by mouth at bedtime. 90 capsule 5  .  ketorolac (TORADOL) 10 MG tablet Take 1 tablet (10 mg total) by mouth 2 (two) times daily as needed for severe pain. 60 tablet 0  . lamoTRIgine (LAMICTAL) 150 MG tablet Take 2 tablets (300 mg total) by mouth at bedtime. 60 tablet 5  . levalbuterol (XOPENEX HFA) 45 MCG/ACT inhaler Inhale 1-2 puffs into the lungs every 6 (six) hours as needed for wheezing. 1 Inhaler 5  . Lysine 1000 MG TABS Take 2,000-4,000 mg by mouth at bedtime. 2000 mg scheduled at bedtime and patient will take 4000 mg if she has outbreak    . mirabegron ER (MYRBETRIQ) 25 MG TB24 tablet Take 1 tablet (25 mg total) by mouth daily. (Patient taking differently: Take 50 mg by mouth daily. ) 90 tablet 0  . neomycin-bacitracin-polymyxin (NEOSPORIN) [phone] ointment Apply 1 application topically 4 (four) times daily as needed (for cut/scrapes.).    Marland Kitchen nicotine (NICODERM CQ - DOSED IN MG/24 HOURS) 21 mg/24hr patch Place 1 patch (21 mg total) onto the skin daily. 28 patch 3  . ondansetron (ZOFRAN ODT) 4 MG disintegrating tablet Take 1 tablet (4 mg total) by mouth every 8 (eight) hours as needed for nausea. 12 tablet 0  . phenazopyridine (AZO-TABS) 95 MG tablet Take 95 mg by mouth 3 (three) times daily as needed for pain.    . polyethylene glycol powder (GLYCOLAX/MIRALAX) powder MIX 17 GRAMS (1 CAPFUL) WITH 4-8 OZ OF LIQUID AND TAKE BY MOUTH TWICE DAILY AS NEEDED (Patient taking differently: MIX 17 GRAMS (1 CAPFUL) WITH 4-8 OZ OF LIQUID AND TAKE BY MOUTH TWICE DAILY AS NEEDED FOR CONSTIPATION) 527 g 0  . tamsulosin (FLOMAX) 0.4 MG CAPS capsule TAKE 1 CAPSULE (0.4 MG TOTAL) BY MOUTH DAILY. 30 capsule 0  . temazepam (RESTORIL) 30 MG capsule TAKE ONE TO TWO CAPSULES BY MOUTH NIGHTLY AT BEDTIME 60 capsule 3  . tiZANidine (ZANAFLEX) 4 MG tablet Take 2 tablets (8 mg total) by mouth every 6 (six) hours as needed for muscle spasms.  720 tablet 3  . zolpidem (AMBIEN) 10 MG tablet Take 1 tablet (10 mg total) by mouth at bedtime. 135 tablet 0   No  current facility-administered medications on file prior to visit.     BP 116/78   Pulse 71   Temp 98.1 F (36.7 C) (Oral)   Ht 5\' 9"  (1.753 m)   Wt 211 lb 1.9 oz (95.8 kg)   LMP 08/23/2014   SpO2 98%   BMI 31.18 kg/m    Objective:   Physical Exam  Constitutional: She is oriented to person, place, and time. She appears well-nourished.  Neck: Normal range of motion. Neck supple.  Cardiovascular: Normal rate and regular rhythm.   Pulmonary/Chest: Effort normal and breath sounds normal.  Neurological: She is alert and oriented to person, place, and time.  Skin: Skin is warm and dry.  Psychiatric: She has a normal mood and affect.          Assessment & Plan:

## 2017-06-23 NOTE — Assessment & Plan Note (Signed)
Following with Urology. To undergo bladder treatment procedure in September for the following 12 weeks.

## 2017-06-23 NOTE — Therapy (Signed)
[facility] [address] [place], [place], [address] Phone: [phone]   Fax:  [phone]  Physical Therapy Treatment  Patient Details  Name: Emily Moon MRN: [id number] Date of Birth: 1961-03-17 Referring Provider: Owens Loffler MD   Encounter Date: 06/22/2017      PT End of Session - 06/22/17 1241    Visit Number 4   Number of Visits 18   Date for PT Re-Evaluation 07/23/17   Authorization Type [employer] G-code   PT Start Time 1233   PT Stop Time 1315   PT Time Calculation (min) 42 min   Activity Tolerance Patient tolerated treatment well   Behavior During Therapy [facility] for tasks assessed/performed      Past Medical History:  Diagnosis Date  . Arthritis    osteo  . Asthma   . Bipolar disorder ([place]) 05/21/14  . Cataracts, bilateral   . DDD (degenerative disc disease), cervical    also back  . Depression   . Dizziness    Positional  . Edema    feet/legs  . Fibromyalgia syndrome   . Fungal infection    Finger nails  . GERD (gastroesophageal reflux disease)   . Gout   . Headache    seasonal allergies  . Heart palpitations   . 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 Hypercholesterolemia   . Multiple sclerosis (HCC)    weakness  . Nephrolithiasis    kidney stones  . Osteoporosis    osteoarthritis  . Pneumonia   . PONV (postoperative nausea and vomiting)    no problem after cataract surgery  . Psoriasis   . Renal stone   . Shortness of breath dyspnea    wheezing  . Sleep apnea 2012   sleep study / slight, no interventions  . Urinary frequency     Past Surgical History:  Procedure Laterality Date  . CATARACT EXTRACTION W/PHACO Left 05/21/2015   Procedure: CATARACT EXTRACTION PHACO AND INTRAOCULAR LENS PLACEMENT (IOC);  Surgeon: Birder Robson, MD;  Location: [facility];  Service: Ophthalmology;  Laterality: Left;  [place] 00:35 AP% 22.9 CDE 8.11 fluid pack lot  #[id number]  . CATARACT EXTRACTION W/PHACO Right 06/04/2015   Procedure: CATARACT EXTRACTION PHACO AND INTRAOCULAR LENS PLACEMENT (IOC);  Surgeon: Birder Robson, MD;  Location: [facility];  Service: Ophthalmology;  Laterality: Right;  US:00:48 AP%: 10.5 CDE:5.08 Fluid lot #[id number] H  . CYSTOSCOPY/URETEROSCOPY/HOLMIUM LASER/STENT PLACEMENT Bilateral 09/22/2016   Procedure: CYSTOSCOPY/URETEROSCOPY/HOLMIUM LASER/STENT PLACEMENT;  Surgeon: Hollice Espy, MD;  Location: [facility];  Service: Urology;  Laterality: Bilateral;  . EYE SURGERY  2015   tissue biopsy  . FOOT SURGERY  2015  . JOINT REPLACEMENT Left 2013   hip replacement  . LITHOTRIPSY    . PTOSIS REPAIR Bilateral 02/18/2016   Procedure: BILATERAL PTOSIS REPAIR UPPER EYELIDS;  Surgeon: Karle Starch, MD;  Location: [place];  Service: Ophthalmology;  Laterality: Bilateral;  LEAVE PT EARLY AM  . thumb surgery Right   . TONSILLECTOMY  1973    There were no vitals filed for this visit.      Subjective Assessment - 06/22/17 1238    Subjective No new falls. Had a miserable time at her neices's wedding, outside in heat and feels her MS has been excerbated. Using her ice towel to keep cool which is helping.    Pertinent History bi-polar disorder, history of L THA  2013, OA needs Right THA, gout, DDD, chronic pain/fibromyalgia, MS, lower leg numbness, positional  dizziness, osteoporosis, kidney stones   Limitations Lifting;Standing;Walking;House hold activities;Sitting   Patient Stated Goals to get my RLE strong again, walk without reliance on cane    Currently in Pain? Yes   Pain Score 5    Pain Location Hip  hip and low back   Pain Descriptors / Indicators Aching;Sore;Throbbing   Pain Type Chronic pain   Pain Onset More than a month ago   Pain Frequency Constant   Aggravating Factors  standing and weight bearing   Pain Relieving Factors unweighting, medication   Multiple Pain Sites Yes   Pain Score 8   Pain Location  Generalized  neck and trunk "MS Hug"   Pain Descriptors / Indicators Throbbing;Constant;Jabbing;Sore   Pain Type Chronic pain   Pain Onset More than a month ago   Pain Frequency Intermittent   Aggravating Factors  increased activity,    Pain Relieving Factors meds,             [facility] Adult PT Treatment/Exercise - 06/23/17 0001      Self-Care   Other Self-Care Comments  DIscussed pt;s pain and how it's changed with mobility. Pt does report an overall improvement, however it varies from day to day depending on her activity, the weather and whether or not she has slept. Discussed pain mangaement techniques/stratgies she is using and alernative ones: ice/heat, medication, stretching, rest and pacing activities.                                           Exercises   Other Exercises  pt performed all exercises issued to HEP with minimal cues on correct technique and hold times. Mild increase in pain reported, however pt was able to continue.              PT Short Term Goals - 06/22/17 1242      PT SHORT TERM GOAL #1   Title Patient will verbalize understanding and return demonstration for initial HEP to increase LE strength and flexibility to decrease pain in R hip. (TARGET DATE: 06/25/2017)    Baseline 06/22/17:  met today   Time --   Period --   Status Achieved     PT SHORT TERM GOAL #2   Title Patient will report R hip pain as </= 4-5/10 when performing bed mobility including rolling to both sides and transferring supine <> sit to indicate improvement in R hip pain. (TARGET DATE: 06/25/2017)    Baseline 06/22/17: pt reports it varies. if she's in the right position with body pillow, its low, 4-5/10. sitll wakes her up when she rolls at times as well.    Time --   Period --   Status Partially Met     PT SHORT TERM GOAL #3   Title Patient reports >25% improvement in right hip pain with standing & gait activities. (Target Date: 06/25/2017)   Baseline 06/22/17: yes for today, however it  varies day to day depending on her activity leve;l   Time --   Period --   Status Partially Met     PT SHORT TERM GOAL #4   Title Patient's 90/90 hamstring measurement on R side will be improve to >/= -12* from full knee extension with reports of an increase in pain as </=3 points higher than her baseline report of pain (on a 10 point pain scale) to indicate improvement in LE  flexibility and a decrease in pain. (TARGET DATE: 06/25/2017)    Time 1   Period Months   Status On-going           PT Long Term Goals - 05/24/17 1334      PT LONG TERM GOAL #1   Title Patient will verbalize understanding and return demonstration of ongoing HEP to increase LE strength and flexibility to decrease R hip pain and risk of falling. (TARGET DATE: 07/23/2017)    Time 2   Period Months   Status New     PT LONG TERM GOAL #2   Title Patient will report R hip pain as </= 3/10 when performing bed mobility including rolling to both sides and transferring supine <> sit to indicate improvement in R hip pain. (TARGET DATE: 07/23/2017)    Time 2   Period Months   Status New     PT LONG TERM GOAL #3   Title Patient reports >50% improvement in pain with standing & gait activities. (Target Date: 07/23/2017)   Time 2   Period Months   Status New     PT LONG TERM GOAL #4   Title Patient's 90/90 hamstring measurement on R side will be improve to >/= -8* from full knee extension without an increase in pain to indicate improvement in LE flexibility and a decrease in pain. (TARGET DATE: 07/23/2017)    Time 2   Period Months   Status New     PT LONG TERM GOAL #5   Title Patient reports a >10% improvement in ABC scale using FOTO (TARGET DATE: 07/23/2017)    Time 2   Period Months   Status New               Plan - 06/22/17 1242    Clinical Impression Statement Today's skilled session focused on checking progress toward STGs. Pt met 1/4 goals, partially met 2/4 goals and had 1 goal left to check at next visist.  Pt continues to report pain as her primary limitiation and a MS excerbation limiting her today. Pt should benefit from continued PT to progress toward unmet    Rehab Potential Good   Clinical Impairments Affecting Rehab Potential bi-polar disorder, history of L THA  2013, OA needs Right THA, gout, DDD, chronic pain/fibromyalgia, MS, lower leg nuumbness, positional dizziness, osteoporosis, kidney stones   PT Frequency 2x / week   PT Duration Other (comment)   PT Treatment/Interventions ADLs/Self Care Home Management;DME Instruction;Gait training;Stair training;Functional mobility training;Therapeutic activities;Therapeutic exercise;Balance training;Neuromuscular re-education;Patient/family education   PT Next Visit Plan Check remaining STG; Continue focus on LE strengthening and flexiblity to relieve R hip pain and increase functional independence   Consulted and Agree with Plan of Care Patient      Patient will benefit from skilled therapeutic intervention in order to improve the following deficits and impairments:  Abnormal gait, Decreased activity tolerance, Decreased balance, Decreased endurance, Decreased knowledge of precautions, Decreased mobility, Decreased strength, Dizziness, Impaired flexibility, Postural dysfunction, Difficulty walking, Pain  Visit Diagnosis: Muscle weakness (generalized)  Pain in right hip     Problem List Patient Active Problem List   Diagnosis Date Noted  . Cough 03/30/2017  . Urinary frequency 03/30/2017  . B12 deficiency 05/22/2016  . Dizziness 03/16/2016  . Medicare annual wellness visit, subsequent 10/25/2015  . Back pain 10/31/2014  . Bipolar disorder ([place]) 05/21/2014  . 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 Insomnia 09/01/2011  . Obesity 05/04/2011  . HLD (hyperlipidemia) 01/20/2011  . SMOKER 01/20/2011  . Chronic pain syndrome 01/07/2011   . RENAL CALCULUS, RECURRENT 11/13/2010  . MULTIPLE SCLEROSIS, PROGRESSIVE/RELAPSING 08/28/2010  . GERD 08/28/2010  . Osteoarthritis 08/28/2010  . NEPHROLITHIASIS, HX OF 08/28/2010    Willow Ora, PTA, [place] [address] [place], [place] [address] [phone] 06/23/17, 12:22 PM   Name: Emily Moon MRN: [id number] Date of Birth: 1961-05-31

## 2017-06-23 NOTE — Assessment & Plan Note (Signed)
Not compliant to monthly B 12 injections as recommended. Recent B12 improved but still on the lower end of normal. B 12 injection provided today. Continue to monitor.

## 2017-06-23 NOTE — Assessment & Plan Note (Signed)
Started smoking again. Long discussion today about risks for heart disease and stroke given her uncontrolled hyperlipidemia. She states she will stop smoking now.

## 2017-06-24 ENCOUNTER — Ambulatory Visit: Payer: PPO | Admitting: Physical Therapy

## 2017-06-24 ENCOUNTER — Encounter: Payer: Self-pay | Admitting: Physical Therapy

## 2017-06-24 DIAGNOSIS — R2689 Other abnormalities of gait and mobility: Secondary | ICD-10-CM

## 2017-06-24 DIAGNOSIS — M6281 Muscle weakness (generalized): Secondary | ICD-10-CM | POA: Diagnosis not present

## 2017-06-24 DIAGNOSIS — M25551 Pain in right hip: Secondary | ICD-10-CM

## 2017-06-24 DIAGNOSIS — Z9181 History of falling: Secondary | ICD-10-CM

## 2017-06-24 DIAGNOSIS — R2681 Unsteadiness on feet: Secondary | ICD-10-CM

## 2017-06-24 DIAGNOSIS — M542 Cervicalgia: Secondary | ICD-10-CM

## 2017-06-24 DIAGNOSIS — R278 Other lack of coordination: Secondary | ICD-10-CM

## 2017-06-24 MED ORDER — ZOSTER VAC RECOMB ADJUVANTED 50 MCG/0.5ML IM SUSR: INTRAMUSCULAR | 1 refills | Status: DC

## 2017-06-24 NOTE — Progress Notes (Signed)
Note reviewed in detail. Agree with care plan as laid out by Allie Bossier , NP.

## 2017-06-24 NOTE — Patient Instructions (Addendum)
Posture - Sitting    Sit upright, head facing forward. Try using a roll to support lower back. Keep shoulders relaxed, and avoid rounded back. Keep hips level with knees. Avoid crossing legs for long periods.   Copyright  VHI. All rights reserved.  Posture - Standing    Good posture is important. Avoid slouching and forward head thrust. Maintain curve in low back and align ears over shoul- ders, hips over ankles.   Copyright  VHI. All rights reserved.

## 2017-06-25 NOTE — Therapy (Addendum)
[facility] [address] [place], [place], [address] Phone: [phone]   Fax:  [phone]  Physical Therapy Treatment  Patient Details  Name: Emily Moon MRN: [id number] Date of Birth: 1961-09-04 Referring Provider: Owens Loffler MD  / [name], NP for cervical muscle spasm referral  Encounter Date: 06/24/2017    Past Medical History:  Diagnosis Date  . Arthritis    osteo  . Asthma   . Bipolar disorder ([place]) 05/21/14  . Cataracts, bilateral   . DDD (degenerative disc disease), cervical    also back  . Depression   . Dizziness    Positional  . Edema    feet/legs  . Fibromyalgia syndrome   . Fungal infection    Finger nails  . GERD (gastroesophageal reflux disease)   . Gout   . Headache    seasonal allergies  . Heart palpitations   . 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 Hypercholesterolemia   . Multiple sclerosis (HCC)    weakness  . Nephrolithiasis    kidney stones  . Osteoporosis    osteoarthritis  . Pneumonia   . PONV (postoperative nausea and vomiting)    no problem after cataract surgery  . Psoriasis   . Renal stone   . Shortness of breath dyspnea    wheezing  . Sleep apnea 2012   sleep study / slight, no interventions  . Urinary frequency     Past Surgical History:  Procedure Laterality Date  . CATARACT EXTRACTION W/PHACO Left 05/21/2015   Procedure: CATARACT EXTRACTION PHACO AND INTRAOCULAR LENS PLACEMENT (IOC);  Surgeon: Birder Robson, MD;  Location: [facility];  Service: Ophthalmology;  Laterality: Left;  [place] 00:35 AP% 22.9 CDE 8.11 fluid pack lot #[id number]  . CATARACT EXTRACTION W/PHACO Right 06/04/2015   Procedure: CATARACT EXTRACTION PHACO AND INTRAOCULAR LENS PLACEMENT (IOC);  Surgeon: Birder Robson, MD;  Location: [facility];  Service: Ophthalmology;  Laterality: Right;  US:00:48 AP%: 10.5 CDE:5.08 Fluid lot #[id number]  . CYSTOSCOPY/URETEROSCOPY/HOLMIUM  LASER/STENT PLACEMENT Bilateral 09/22/2016   Procedure: CYSTOSCOPY/URETEROSCOPY/HOLMIUM LASER/STENT PLACEMENT;  Surgeon: Hollice Espy, MD;  Location: [facility];  Service: Urology;  Laterality: Bilateral;  . EYE SURGERY  2015   tissue biopsy  . FOOT SURGERY  2015  . JOINT REPLACEMENT Left 2013   hip replacement  . LITHOTRIPSY    . PTOSIS REPAIR Bilateral 02/18/2016   Procedure: BILATERAL PTOSIS REPAIR UPPER EYELIDS;  Surgeon: Karle Starch, MD;  Location: [facility];  Service: Ophthalmology;  Laterality: Bilateral;  LEAVE PT EARLY AM  . thumb surgery Right   . TONSILLECTOMY  1973    There were no vitals filed for this visit.      Subjective Assessment - 06/24/17 1316    Subjective No falls. PA at PCP practice sent referral for cervical assessment.    Pertinent History bi-polar disorder, history of L THA  2013, OA needs Right THA, gout, DDD, chronic pain/fibromyalgia, MS, lower leg numbness, positional dizziness, osteoporosis, kidney stones   Limitations Lifting;Standing;Walking;House hold activities;Sitting   Patient Stated Goals to get my RLE strong again, walk without reliance on cane    Currently in Pain? Yes   Pain Score 8    Pain Location Neck   Pain Orientation Right;Posterior   Pain Descriptors / Indicators Tightness;Throbbing   Pain Type Chronic pain   Pain Onset In the past 7 days   Pain Frequency Constant   Aggravating Factors  slept in unusual position   Pain Relieving  Factors decrease intensity, meds, resting with posterior support down to 2.5/10   Multiple Pain Sites Yes   Pain Score 6   Pain Location Hip   Pain Orientation Anterior;Lateral;Right   Pain Descriptors / Indicators Burning;Aching;Shooting   Pain Type Chronic pain   Pain Onset More than a month ago   Pain Frequency Intermittent   Aggravating Factors  hip abduction, walking esp uphill,    Pain Relieving Factors lay down esp using body pillow   Effect of Pain on Daily Activities limits walking             [facility] PT Assessment - 06/24/17 1315      Posture/Postural Control   Posture/Postural Control Postural limitations   Postural Limitations Rounded Shoulders;Forward head;Flexed trunk;Weight shift left     ROM / Strength   AROM / PROM / Strength AROM;Strength     AROM   Overall AROM  Deficits   AROM Assessment Site Cervical   Cervical Flexion 27   Cervical Extension 34   Cervical - Right Side Bend 16   Cervical - Left Side Bend 13   Cervical - Right Rotation 20   Cervical - Left Rotation 11     Flexibility   Hamstrings right -12*, left -10*  Tested with hip 90* and extend knee /measuring knee ext     Palpation   Palpation comment Muscle tightness & tenderness in cervical paraspinals, upper trapezius Rt>lt,      Special Tests    Special Tests Cervical   Cervical Tests Dictraction     Distraction Test   Findngs Negative   Comment tightness & pt guarding limited test     Bed Mobility   Supine to Sit 6: Modified independent (Device/Increase time)   Supine to Sit Details (indicate cue type and reason) demonstrates technique to protect back   Sit to Supine 6: Modified independent (Device/Increase time)   Sit to Supine - Details (indicate cue type and reason) demonstrates technique to protect back                             PT Education - 06/24/17 1330    Education provided Yes   Education Details Cervical ROM with towel /SNAG for rotation & extension, posture sitting & standing   Person(s) Educated Patient   Methods Explanation;Demonstration;Verbal cues;Handout;Tactile cues   Comprehension Verbalized understanding;Returned demonstration;Verbal cues required;Tactile cues required;Need further instruction          PT Short Term Goals - 06/24/17 1430      PT SHORT TERM GOAL #1   Title Patient will verbalize understanding and return demonstration for initial HEP to increase LE strength and flexibility to decrease pain in R hip. (TARGET  DATE: 06/25/2017)    Baseline 06/22/17:  met today   Status Achieved     PT SHORT TERM GOAL #2   Title Patient will report R hip pain as </= 4-5/10 when performing bed mobility including rolling to both sides and transferring supine <> sit to indicate improvement in R hip pain. (TARGET DATE: 06/25/2017)    Baseline 06/22/17: pt reports it varies. if she's in the right position with body pillow, its low, 4-5/10. sitll wakes her up when she rolls at times as well.    Status Partially Met     PT SHORT TERM GOAL #3   Title Patient reports >25% improvement in right hip pain with standing & gait activities. (Target Date: 06/25/2017)  Baseline 06/22/17: yes for today, however it varies day to day depending on her activity leve;l   Status Partially Met     PT SHORT TERM GOAL #4   Title Patient's 90/90 hamstring measurement on R side will be improve to >/= -12* from full knee extension with reports of an increase in pain as </=3 points higher than her baseline report of pain (on a 10 point pain scale) to indicate improvement in LE flexibility and a decrease in pain. (TARGET DATE: 06/25/2017)    Baseline MET 06/24/2017 right -12*, left -10*   Time 1   Period Months   Status Achieved           PT Long Term Goals - 06/24/17 2002      PT LONG TERM GOAL #1   Title Patient will verbalize understanding and return demonstration of ongoing HEP to increase LE strength and flexibility to decrease R hip pain and risk of falling. (TARGET DATE: 07/23/2017)    Time 2   Period Months   Status On-going     PT LONG TERM GOAL #2   Title Patient will report R hip pain as </= 3/10 when performing bed mobility including rolling to both sides and transferring supine <> sit to indicate improvement in R hip pain. (TARGET DATE: 07/23/2017)    Time 2   Period Months   Status New     PT LONG TERM GOAL #3   Title Patient reports >50% improvement in pain with standing & gait activities. (Target Date: 07/23/2017)   Time 2    Period Months   Status On-going     PT LONG TERM GOAL #4   Title Patient's 90/90 hamstring measurement on R side will be improve to >/= -8* from full knee extension without an increase in pain to indicate improvement in LE flexibility and a decrease in pain. (TARGET DATE: 07/23/2017)    Time 2   Period Months   Status On-going     PT LONG TERM GOAL #5   Title Patient reports a >10% improvement in ABC scale using FOTO (TARGET DATE: 07/23/2017)    Time 2   Period Months   Status On-going     PT LONG TERM GOAL #6   Title Patient AROM for cervical flexion, extension and rotation rt/lt improves >/= 10* Target Date (07/23/2017)   Time 4   Period Weeks   Status New     PT LONG TERM GOAL #7   Title Patient reports cervical pain </=7/10 with cell phone or computer use. (Target Date: 07/23/2017)   Time 4   Period Weeks   Status New     PT LONG TERM GOAL #8   Title Patient demonstrates understanding of cervical HEP & posture with sitting & standing activities. (Target Date: 07/23/2017)   Time 4   Period Weeks   Status New               Plan - 06/24/17 2008    Clinical Impression Statement New referral from patient's PCP for cervical muscles spasms. PT assessed neck ROM with significant limitations. She performed ROM with towel / SNAG with increased rotation without non-verbal or verbal pain increase.    Clinical Presentation Evolving   Clinical Decision Making Low   Rehab Potential Good   Clinical Impairments Affecting Rehab Potential bi-polar disorder, history of L THA  2013, OA needs Right THA, gout, DDD, chronic pain/fibromyalgia, MS, lower leg nuumbness, positional dizziness, osteoporosis, kidney stones  PT Frequency 2x / week   PT Duration Other (comment)  4 weeks (from 04/16/5378 recert)   PT Treatment/Interventions ADLs/Self Care Home Management;DME Instruction;Gait training;Stair training;Functional mobility training;Therapeutic activities;Therapeutic exercise;Balance  training;Neuromuscular re-education;Patient/family education;Electrical Stimulation;Moist Heat;Ultrasound;Cryotherapy   PT Next Visit Plan Check cervical exercises and add cervical exercises. Continue LE strength & flexbility to relieve R hip pain. Gait training with decreased abduction.    Consulted and Agree with Plan of Care Patient      Patient will benefit from skilled therapeutic intervention in order to improve the following deficits and impairments:  Abnormal gait, Decreased activity tolerance, Decreased balance, Decreased endurance, Decreased knowledge of precautions, Decreased mobility, Decreased strength, Dizziness, Impaired flexibility, Postural dysfunction, Difficulty walking, Pain  Visit Diagnosis: Muscle weakness (generalized)  Pain in right hip  History of falling  Unsteadiness on feet  Other abnormalities of gait and mobility  Other lack of coordination  Cervicalgia    July 23, 2017 1859  PT G-Codes  Functional Assessment Tool Used (Outpatient Only) Cervical pain 8/10 and hip pain 5/10 limiting her activities.   Functional Limitation Other PT primary  Other PT Primary Current Status (K3276) CL  Other PT Primary Goal Status (D4709) CJ     Problem List Patient Active Problem List   Diagnosis Date Noted  . Cough 03/30/2017  . Urinary frequency 03/30/2017  . B12 deficiency 05/22/2016  . Dizziness 03/16/2016  . Medicare annual wellness visit, subsequent 10/25/2015  . Back pain 10/31/2014  . Bipolar disorder ([place]) 05/21/2014  . 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 Insomnia 09/01/2011  . Obesity 05/04/2011  . HLD (hyperlipidemia) 01/20/2011  . SMOKER 01/20/2011  . Chronic pain syndrome 01/07/2011  . RENAL CALCULUS, RECURRENT 11/13/2010  . MULTIPLE SCLEROSIS, PROGRESSIVE/RELAPSING 08/28/2010  . GERD 08/28/2010  . Osteoarthritis 08/28/2010  .  NEPHROLITHIASIS, HX OF 08/28/2010    Jamey Reas PT, DPT 06/25/2017, 12:22 AM  [facility] [address] [place], [place], [address] Phone: [phone]   Fax:  [phone]  Name: Emily Moon MRN: [id number] Date of Birth: 1961-03-17

## 2017-06-28 ENCOUNTER — Ambulatory Visit: Payer: PPO | Admitting: Physical Therapy

## 2017-06-28 ENCOUNTER — Encounter: Payer: Self-pay | Admitting: Physical Therapy

## 2017-06-28 DIAGNOSIS — R2689 Other abnormalities of gait and mobility: Secondary | ICD-10-CM

## 2017-06-28 DIAGNOSIS — Z9181 History of falling: Secondary | ICD-10-CM

## 2017-06-28 DIAGNOSIS — M6281 Muscle weakness (generalized): Secondary | ICD-10-CM | POA: Diagnosis not present

## 2017-06-28 DIAGNOSIS — M25551 Pain in right hip: Secondary | ICD-10-CM

## 2017-06-28 DIAGNOSIS — M542 Cervicalgia: Secondary | ICD-10-CM

## 2017-06-28 DIAGNOSIS — R2681 Unsteadiness on feet: Secondary | ICD-10-CM

## 2017-06-28 NOTE — Therapy (Signed)
[facility] [address] [place], [place], [address] Phone: [phone]   Fax:  [phone]  Physical Therapy Treatment  Patient Details  Name: Emily Moon MRN: [id number] Date of Birth: June 30, 1961 Referring Provider: Hannah Beat MD   Encounter Date: 06/28/2017      PT End of Session - 06/28/17 1518    Visit Number 5   Number of Visits 18   Date for PT Re-Evaluation 07/23/17   Authorization Type [employer] G-code   PT Start Time 1230   PT Stop Time 1315   PT Time Calculation (min) 45 min   Activity Tolerance Patient tolerated treatment well   Behavior During Therapy [facility] for tasks assessed/performed      Past Medical History:  Diagnosis Date  . Arthritis    osteo  . Asthma   . Bipolar disorder ([facility]) 05/21/14  . Cataracts, bilateral   . DDD (degenerative disc disease), cervical    also back  . Depression   . Dizziness    Positional  . Edema    feet/legs  . Fibromyalgia syndrome   . Fungal infection    Finger nails  . GERD (gastroesophageal reflux disease)   . Gout   . Headache    seasonal allergies  . Heart palpitations   . 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 Hypercholesterolemia   . Multiple sclerosis (HCC)    weakness  . Nephrolithiasis    kidney stones  . Osteoporosis    osteoarthritis  . Pneumonia   . PONV (postoperative nausea and vomiting)    no problem after cataract surgery  . Psoriasis   . Renal stone   . Shortness of breath dyspnea    wheezing  . Sleep apnea 2012   sleep study / slight, no interventions  . Urinary frequency     Past Surgical History:  Procedure Laterality Date  . CATARACT EXTRACTION W/PHACO Left 05/21/2015   Procedure: CATARACT EXTRACTION PHACO AND INTRAOCULAR LENS PLACEMENT (IOC);  Surgeon: Galen Manila, MD;  Location: [facility];  Service: Ophthalmology;  Laterality: Left;  [place] 00:35 AP% 22.9 CDE 8.11 fluid pack lot  #[id number] H  . CATARACT EXTRACTION W/PHACO Right 06/04/2015   Procedure: CATARACT EXTRACTION PHACO AND INTRAOCULAR LENS PLACEMENT (IOC);  Surgeon: Galen Manila, MD;  Location: [facility];  Service: Ophthalmology;  Laterality: Right;  US:00:48 AP%: 10.5 CDE:5.08 Fluid lot #[id number]  . CYSTOSCOPY/URETEROSCOPY/HOLMIUM LASER/STENT PLACEMENT Bilateral 09/22/2016   Procedure: CYSTOSCOPY/URETEROSCOPY/HOLMIUM LASER/STENT PLACEMENT;  Surgeon: Vanna Scotland, MD;  Location: [facility];  Service: Urology;  Laterality: Bilateral;  . EYE SURGERY  2015   tissue biopsy  . FOOT SURGERY  2015  . JOINT REPLACEMENT Left 2013   hip replacement  . LITHOTRIPSY    . PTOSIS REPAIR Bilateral 02/18/2016   Procedure: BILATERAL PTOSIS REPAIR UPPER EYELIDS;  Surgeon: Imagene Riches, MD;  Location: [facility];  Service: Ophthalmology;  Laterality: Bilateral;  LEAVE PT EARLY AM  . thumb surgery Right   . TONSILLECTOMY  1973    There were no vitals filed for this visit.      Subjective Assessment - 06/28/17 1232    Subjective She can not afford the co-pay twice a week so she needs to reduce to one time per week.    Pertinent History bi-polar disorder, history of L THA  2013, OA needs Right THA, gout, DDD, chronic pain/fibromyalgia, MS, lower leg numbness, positional dizziness, osteoporosis, kidney stones   Limitations Lifting;Standing;Walking;House hold activities;Sitting  Patient Stated Goals to get my RLE strong again, walk without reliance on cane    Currently in Pain? Yes   Pain Score 4    Pain Location Neck   Pain Orientation Posterior;Mid   Pain Descriptors / Indicators Throbbing;Tightness;Pressure   Pain Type Chronic pain   Pain Onset 1 to 4 weeks ago   Pain Frequency Constant   Aggravating Factors  sleeping in unusual  position   Pain Relieving Factors stretches PT gave her are helping   Multiple Pain Sites Yes   Pain Score 3   Pain Location Hip   Pain Orientation Right;Lateral;Left   Pain  Descriptors / Indicators Burning;Aching;Shooting   Pain Type Chronic pain   Pain Onset More than a month ago   Pain Frequency Intermittent   Aggravating Factors  hip abduction, walking esp uphill   Pain Relieving Factors lay down esp using body pillow   Pain Score 8   Pain Location Back   Pain Orientation Posterior;Mid;Right;Left   Pain Descriptors / Indicators Aching;Spasm;Sore   Pain Type Chronic pain   Pain Onset More than a month ago   Pain Frequency Constant   Aggravating Factors  bladder spasms, overdoing it   Pain Relieving Factors laying down, ice                         [facility] Adult PT Treatment/Exercise - 06/28/17 1230      Ambulation/Gait   Ambulation/Gait Yes   Ambulation/Gait Assistance 5: Supervision   Ambulation/Gait Assistance Details PT demo & verbal cues using visual feedback of line on floor for proper step width.    Ambulation Distance (Feet) 400 Feet   Assistive device None   Ambulation Surface Indoor;Level   Stairs Yes   Stairs Assistance 5: Supervision   Stairs Assistance Details (indicate cue type and reason) verbal & demo cues on step width & wt shift, positioning to rail(s)   Stair Management Technique Two rails;One rail Right;No rails;Alternating pattern;Forwards  no rails - slid hand along left wall    Number of Stairs 4  5 reps with varying rail placement   Height of Stairs 6   Ramp 5: Supervision  no device   Ramp Details (indicate cue type and reason) verbal & tactile cues on posture, step width and wt shift   Curb 5: Supervision  no device   Curb Details (indicate cue type and reason) 10 reps, verbal, tactile & demo cues on technique including foot position & using momentum.                 PT Education - 06/28/17 1230    Education provided Yes   Education Details Use of Theracane for trigger point release   Person(s) Educated Patient   Methods Explanation;Demonstration;Verbal cues;Other (comment)  owner's manual  on internet   Comprehension Verbalized understanding;Returned demonstration          PT Short Term Goals - 06/24/17 1430      PT SHORT TERM GOAL #1   Title Patient will verbalize understanding and return demonstration for initial HEP to increase LE strength and flexibility to decrease pain in R hip. (TARGET DATE: 06/25/2017)    Baseline 06/22/17:  met today   Status Achieved     PT SHORT TERM GOAL #2   Title Patient will report R hip pain as </= 4-5/10 when performing bed mobility including rolling to both sides and transferring supine <> sit to indicate improvement in R  hip pain. (TARGET DATE: 06/25/2017)    Baseline 06/22/17: pt reports it varies. if she's in the right position with body pillow, its low, 4-5/10. sitll wakes her up when she rolls at times as well.    Status Partially Met     PT SHORT TERM GOAL #3   Title Patient reports >25% improvement in right hip pain with standing & gait activities. (Target Date: 06/25/2017)   Baseline 06/22/17: yes for today, however it varies day to day depending on her activity leve;l   Status Partially Met     PT SHORT TERM GOAL #4   Title Patient's 90/90 hamstring measurement on R side will be improve to >/= -12* from full knee extension with reports of an increase in pain as </=3 points higher than her baseline report of pain (on a 10 point pain scale) to indicate improvement in LE flexibility and a decrease in pain. (TARGET DATE: 06/25/2017)    Baseline MET 06/24/2017 right -12*, left -10*   Time 1   Period Months   Status Achieved           PT Long Term Goals - 06/24/17 2002      PT LONG TERM GOAL #1   Title Patient will verbalize understanding and return demonstration of ongoing HEP to increase LE strength and flexibility to decrease R hip pain and risk of falling. (TARGET DATE: 07/23/2017)    Time 2   Period Months   Status On-going     PT LONG TERM GOAL #2   Title Patient will report R hip pain as </= 3/10 when performing bed mobility  including rolling to both sides and transferring supine <> sit to indicate improvement in R hip pain. (TARGET DATE: 07/23/2017)    Time 2   Period Months   Status New     PT LONG TERM GOAL #3   Title Patient reports >50% improvement in pain with standing & gait activities. (Target Date: 07/23/2017)   Time 2   Period Months   Status On-going     PT LONG TERM GOAL #4   Title Patient's 90/90 hamstring measurement on R side will be improve to >/= -8* from full knee extension without an increase in pain to indicate improvement in LE flexibility and a decrease in pain. (TARGET DATE: 07/23/2017)    Time 2   Period Months   Status On-going     PT LONG TERM GOAL #5   Title Patient reports a >10% improvement in ABC scale using FOTO (TARGET DATE: 07/23/2017)    Time 2   Period Months   Status On-going     PT LONG TERM GOAL #6   Title Patient AROM for cervical flexion, extension and rotation rt/lt improves >/= 10* Target Date (07/23/2017)   Time 4   Period Weeks   Status New     PT LONG TERM GOAL #7   Title Patient reports cervical pain </=7/10 with cell phone or computer use. (Target Date: 07/23/2017)   Time 4   Period Weeks   Status New     PT LONG TERM GOAL #8   Title Patient demonstrates understanding of cervical HEP & posture with sitting & standing activities. (Target Date: 07/23/2017)   Time 4   Period Weeks   Status New               Plan - 06/28/17 1519    Clinical Impression Statement Patient requested to decrease frequency of visits due to co-pay  causing financial hardship. Pt reports neck and hip pain are improving with PT instructions. Patient improved gait & barriers including balance reactions with skilled PT instructions.    Rehab Potential Good   Clinical Impairments Affecting Rehab Potential bi-polar disorder, history of L THA  2013, OA needs Right THA, gout, DDD, chronic pain/fibromyalgia, MS, lower leg nuumbness, positional dizziness, osteoporosis, kidney stones   PT  Frequency 2x / week   PT Duration Other (comment)  4 weeks (from 06/24/1693 recert)   PT Treatment/Interventions ADLs/Self Care Home Management;DME Instruction;Gait training;Stair training;Functional mobility training;Therapeutic activities;Therapeutic exercise;Balance training;Neuromuscular re-education;Patient/family education;Electrical Stimulation;Moist Heat;Ultrasound;Cryotherapy   PT Next Visit Plan Check cervical exercises and add cervical exercises. Continue LE strength & flexbility to relieve R hip pain. Gait training with decreased abduction.    Consulted and Agree with Plan of Care Patient      Patient will benefit from skilled therapeutic intervention in order to improve the following deficits and impairments:  Abnormal gait, Decreased activity tolerance, Decreased balance, Decreased endurance, Decreased knowledge of precautions, Decreased mobility, Decreased strength, Dizziness, Impaired flexibility, Postural dysfunction, Difficulty walking, Pain  Visit Diagnosis: Muscle weakness (generalized)  Pain in right hip  History of falling  Unsteadiness on feet  Other abnormalities of gait and mobility  Cervicalgia     Problem List Patient Active Problem List   Diagnosis Date Noted  . Cough 03/30/2017  . Urinary frequency 03/30/2017  . B12 deficiency 05/22/2016  . Dizziness 03/16/2016  . Medicare annual wellness visit, subsequent 10/25/2015  . Back pain 10/31/2014  . Bipolar disorder ([place]) 05/21/2014  . 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 Insomnia 09/01/2011  . Obesity 05/04/2011  . HLD (hyperlipidemia) 01/20/2011  . SMOKER 01/20/2011  . Chronic pain syndrome 01/07/2011  . RENAL CALCULUS, RECURRENT 11/13/2010  . MULTIPLE SCLEROSIS, PROGRESSIVE/RELAPSING 08/28/2010  . GERD 08/28/2010  . Osteoarthritis 08/28/2010  . NEPHROLITHIASIS, HX OF 08/28/2010     Jamey Reas PT, DPT 06/28/2017, 3:22 PM  [facility] [address] [place], [place], [address] Phone: [phone]   Fax:  [phone]  Name: MAIRANY BRUNO MRN: [id number] Date of Birth: [birth year]

## 2017-06-30 ENCOUNTER — Encounter: Payer: Self-pay | Admitting: Primary Care

## 2017-06-30 ENCOUNTER — Ambulatory Visit: Payer: Self-pay | Admitting: Physical Therapy

## 2017-07-01 ENCOUNTER — Other Ambulatory Visit: Payer: Self-pay | Admitting: Primary Care

## 2017-07-01 DIAGNOSIS — R252 Cramp and spasm: Secondary | ICD-10-CM

## 2017-07-05 ENCOUNTER — Encounter: Payer: Self-pay | Admitting: Physical Therapy

## 2017-07-05 ENCOUNTER — Ambulatory Visit: Payer: PPO | Admitting: Physical Therapy

## 2017-07-05 DIAGNOSIS — M542 Cervicalgia: Secondary | ICD-10-CM

## 2017-07-05 DIAGNOSIS — M25551 Pain in right hip: Secondary | ICD-10-CM

## 2017-07-05 DIAGNOSIS — M6281 Muscle weakness (generalized): Secondary | ICD-10-CM | POA: Diagnosis not present

## 2017-07-05 DIAGNOSIS — R2689 Other abnormalities of gait and mobility: Secondary | ICD-10-CM

## 2017-07-05 DIAGNOSIS — R2681 Unsteadiness on feet: Secondary | ICD-10-CM

## 2017-07-06 NOTE — Therapy (Signed)
[facility] [address] [place], [place], [address] Phone: [phone]   Fax:  [phone]  Physical Therapy Treatment  Patient Details  Name: Emily Moon MRN: [id number] Date of Birth: 1960/12/05 Referring Provider: Owens Loffler MD   Encounter Date: 07/05/2017      PT End of Session - 07/05/17 1616    Visit Number 6   Number of Visits 18   Date for PT Re-Evaluation 07/23/17   Authorization Type [employer] G-code   PT Start Time 1230   PT Stop Time 1315   PT Time Calculation (min) 45 min   Activity Tolerance Patient tolerated treatment well   Behavior During Therapy [facility] for tasks assessed/performed      Past Medical History:  Diagnosis Date  . Arthritis    osteo  . Asthma   . Bipolar disorder ([place]) 05/21/14  . Cataracts, bilateral   . DDD (degenerative disc disease), cervical    also back  . Depression   . Dizziness    Positional  . Edema    feet/legs  . Fibromyalgia syndrome   . Fungal infection    Finger nails  . GERD (gastroesophageal reflux disease)   . Gout   . Headache    seasonal allergies  . Heart palpitations   . 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 Hypercholesterolemia   . Multiple sclerosis (HCC)    weakness  . Nephrolithiasis    kidney stones  . Osteoporosis    osteoarthritis  . Pneumonia   . PONV (postoperative nausea and vomiting)    no problem after cataract surgery  . Psoriasis   . Renal stone   . Shortness of breath dyspnea    wheezing  . Sleep apnea 2012   sleep study / slight, no interventions  . Urinary frequency     Past Surgical History:  Procedure Laterality Date  . CATARACT EXTRACTION W/PHACO Left 05/21/2015   Procedure: CATARACT EXTRACTION PHACO AND INTRAOCULAR LENS PLACEMENT (IOC);  Surgeon: Birder Robson, MD;  Location: [facility];  Service: Ophthalmology;  Laterality: Left;  [place] 00:35 AP% 22.9 CDE 8.11 fluid pack lot  #[id number]  . CATARACT EXTRACTION W/PHACO Right 06/04/2015   Procedure: CATARACT EXTRACTION PHACO AND INTRAOCULAR LENS PLACEMENT (IOC);  Surgeon: Birder Robson, MD;  Location: [facility];  Service: Ophthalmology;  Laterality: Right;  US:00:48 AP%: 10.5 CDE:5.08 Fluid lot #[id number]  . CYSTOSCOPY/URETEROSCOPY/HOLMIUM LASER/STENT PLACEMENT Bilateral 09/22/2016   Procedure: CYSTOSCOPY/URETEROSCOPY/HOLMIUM LASER/STENT PLACEMENT;  Surgeon: Hollice Espy, MD;  Location: [facility];  Service: Urology;  Laterality: Bilateral;  . EYE SURGERY  2015   tissue biopsy  . FOOT SURGERY  2015  . JOINT REPLACEMENT Left 2013   hip replacement  . LITHOTRIPSY    . PTOSIS REPAIR Bilateral 02/18/2016   Procedure: BILATERAL PTOSIS REPAIR UPPER EYELIDS;  Surgeon: Karle Starch, MD;  Location: [facility];  Service: Ophthalmology;  Laterality: Bilateral;  LEAVE PT EARLY AM  . thumb surgery Right   . TONSILLECTOMY  1973    There were no vitals filed for this visit.      Subjective Assessment - 07/05/17 1228    Subjective She has been doing the stretches. Her knees are sore but has not done anything differnt.    Pertinent History bi-polar disorder, history of L THA  2013, OA needs Right THA, gout, DDD, chronic pain/fibromyalgia, MS, lower leg numbness, positional dizziness, osteoporosis, kidney stones   Limitations Lifting;Standing;Walking;House hold activities;Sitting   Patient Stated Goals  to get my RLE strong again, walk without reliance on cane    Currently in Pain? Yes   Pain Score 2    Pain Location Neck   Pain Orientation Posterior;Mid;Right   Pain Descriptors / Indicators Aching;Other (Comment)  pulling   Pain Type Chronic pain   Pain Onset 1 to 4 weeks ago   Pain Frequency Constant   Aggravating Factors  sleeping in unusual position   Pain Relieving Factors stretches & lidocane   Pain Score 3   Pain Location Hip   Pain Orientation Right;Lateral;Left   Pain Descriptors / Indicators  Burning;Aching;Shooting   Pain Type Chronic pain   Pain Onset More than a month ago   Pain Frequency Intermittent   Aggravating Factors  hip abduction, walking esp uphill   Pain Relieving Factors lay down esp using body pillow                         [facility] Adult PT Treatment/Exercise - 07/05/17 1230      Ambulation/Gait   Ambulation/Gait Yes   Ambulation/Gait Assistance 5: Supervision   Ambulation/Gait Assistance Details verbal & visual cues on proper step width and upright posture.    Ambulation Distance (Feet) 400 Feet   Assistive device None   Ambulation Surface Indoor;Level   Stairs Yes   Stairs Assistance 5: Supervision   Stairs Assistance Details (indicate cue type and reason) verbal cues on proper step width, position to rail for posture & LE motion/coordination   Stair Management Technique Two rails;One rail Left;One rail Right;Alternating pattern;Forwards   Number of Stairs 4  5 reps     Exercises   Exercises Neck     Neck Exercises: Supine   Neck Retraction 5 reps;5 secs   Neck Retraction Limitations tactile cues on technique & posture   Cervical Rotation Right;Left;10 reps   Cervical Rotation Limitations SNAGs using towel with verbal cues on technique     Neck Exercises: Sidelying   Other Sidelying Exercise right & left sidelying shoulder horizontal abduction with strecth for pectoralis as end range, progressed to diagonals.  10 reps 2 sets right & left     Knee/Hip Exercises: Stretches   Piriformis Stretch Right;2 reps;30 seconds   Piriformis Stretch Limitations demo & visual cues on technique     Manual Therapy   Manual Therapy Soft tissue mobilization;Manual Traction;Neural Stretch;Other (comment)   Soft tissue mobilization cervical soft tissue for paraspinals and upper trapezuis.     Manual Traction cervical traction in supine   Other Manual Therapy Rolling pin ITB stripping. PT instructed pt how to perform with frozen water bottle & pt  verbalized understanding.    Neural Stretch SLR with dorsiflexion neural stretch to RLE                  PT Short Term Goals - 06/24/17 1430      PT SHORT TERM GOAL #1   Title Patient will verbalize understanding and return demonstration for initial HEP to increase LE strength and flexibility to decrease pain in R hip. (TARGET DATE: 06/25/2017)    Baseline 06/22/17:  met today   Status Achieved     PT SHORT TERM GOAL #2   Title Patient will report R hip pain as </= 4-5/10 when performing bed mobility including rolling to both sides and transferring supine <> sit to indicate improvement in R hip pain. (TARGET DATE: 06/25/2017)    Baseline 06/22/17: pt reports it varies. if  she's in the right position with body pillow, its low, 4-5/10. sitll wakes her up when she rolls at times as well.    Status Partially Met     PT SHORT TERM GOAL #3   Title Patient reports >25% improvement in right hip pain with standing & gait activities. (Target Date: 06/25/2017)   Baseline 06/22/17: yes for today, however it varies day to day depending on her activity leve;l   Status Partially Met     PT SHORT TERM GOAL #4   Title Patient's 90/90 hamstring measurement on R side will be improve to >/= -12* from full knee extension with reports of an increase in pain as </=3 points higher than her baseline report of pain (on a 10 point pain scale) to indicate improvement in LE flexibility and a decrease in pain. (TARGET DATE: 06/25/2017)    Baseline MET 06/24/2017 right -12*, left -10*   Time 1   Period Months   Status Achieved           PT Long Term Goals - 06/24/17 2002      PT LONG TERM GOAL #1   Title Patient will verbalize understanding and return demonstration of ongoing HEP to increase LE strength and flexibility to decrease R hip pain and risk of falling. (TARGET DATE: 07/23/2017)    Time 2   Period Months   Status On-going     PT LONG TERM GOAL #2   Title Patient will report R hip pain as </= 3/10  when performing bed mobility including rolling to both sides and transferring supine <> sit to indicate improvement in R hip pain. (TARGET DATE: 07/23/2017)    Time 2   Period Months   Status New     PT LONG TERM GOAL #3   Title Patient reports >50% improvement in pain with standing & gait activities. (Target Date: 07/23/2017)   Time 2   Period Months   Status On-going     PT LONG TERM GOAL #4   Title Patient's 90/90 hamstring measurement on R side will be improve to >/= -8* from full knee extension without an increase in pain to indicate improvement in LE flexibility and a decrease in pain. (TARGET DATE: 07/23/2017)    Time 2   Period Months   Status On-going     PT LONG TERM GOAL #5   Title Patient reports a >10% improvement in ABC scale using FOTO (TARGET DATE: 07/23/2017)    Time 2   Period Months   Status On-going     PT LONG TERM GOAL #6   Title Patient AROM for cervical flexion, extension and rotation rt/lt improves >/= 10* Target Date (07/23/2017)   Time 4   Period Weeks   Status New     PT LONG TERM GOAL #7   Title Patient reports cervical pain </=7/10 with cell phone or computer use. (Target Date: 07/23/2017)   Time 4   Period Weeks   Status New     PT LONG TERM GOAL #8   Title Patient demonstrates understanding of cervical HEP & posture with sitting & standing activities. (Target Date: 07/23/2017)   Time 4   Period Weeks   Status New               Plan - 07/05/17 1620    Clinical Impression Statement Patient improved cervical ROM with less pain with skilled instruction in exercises that she can perform at home as she requested 1x/wk frequency.  Patient  has less pain in hip with gait with improved abduction including stairs.    Rehab Potential Good   Clinical Impairments Affecting Rehab Potential bi-polar disorder, history of L THA  2013, OA needs Right THA, gout, DDD, chronic pain/fibromyalgia, MS, lower leg nuumbness, positional dizziness, osteoporosis, kidney  stones   PT Frequency 2x / week   PT Duration Other (comment)  4 weeks (from 06/24/7914 recert)   PT Treatment/Interventions ADLs/Self Care Home Management;DME Instruction;Gait training;Stair training;Functional mobility training;Therapeutic activities;Therapeutic exercise;Balance training;Neuromuscular re-education;Patient/family education;Electrical Stimulation;Moist Heat;Ultrasound;Cryotherapy   PT Next Visit Plan Check cervical exercises and add cervical exercises. Continue LE strength & flexbility to relieve R hip pain. Gait training with decreased abduction.    Consulted and Agree with Plan of Care Patient      Patient will benefit from skilled therapeutic intervention in order to improve the following deficits and impairments:  Abnormal gait, Decreased activity tolerance, Decreased balance, Decreased endurance, Decreased knowledge of precautions, Decreased mobility, Decreased strength, Dizziness, Impaired flexibility, Postural dysfunction, Difficulty walking, Pain  Visit Diagnosis: Muscle weakness (generalized)  Pain in right hip  Unsteadiness on feet  Other abnormalities of gait and mobility  Cervicalgia     Problem List Patient Active Problem List   Diagnosis Date Noted  . Cough 03/30/2017  . Urinary frequency 03/30/2017  . B12 deficiency 05/22/2016  . Dizziness 03/16/2016  . Medicare annual wellness visit, subsequent 10/25/2015  . Back pain 10/31/2014  . Bipolar disorder ([place]) 05/21/2014  . 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 Insomnia 09/01/2011  . Obesity 05/04/2011  . HLD (hyperlipidemia) 01/20/2011  . SMOKER 01/20/2011  . Chronic pain syndrome 01/07/2011  . RENAL CALCULUS, RECURRENT 11/13/2010  . MULTIPLE SCLEROSIS, PROGRESSIVE/RELAPSING 08/28/2010  . GERD 08/28/2010  . Osteoarthritis 08/28/2010  . NEPHROLITHIASIS, HX OF 08/28/2010    Jamey Reas  PT, DPT 07/06/2017, 6:35 AM  [facility] [address] [place], [place], [address] Phone: [phone]   Fax:  [phone]  Name: Emily Moon MRN: [id number] Date of Birth: [birth year]

## 2017-07-07 ENCOUNTER — Telehealth: Payer: Self-pay

## 2017-07-07 ENCOUNTER — Ambulatory Visit: Payer: Self-pay | Admitting: Physical Therapy

## 2017-07-07 DIAGNOSIS — R32 Unspecified urinary incontinence: Secondary | ICD-10-CM

## 2017-07-07 MED ORDER — MIRABEGRON ER 50 MG PO TB24: 50.0000 mg | ORAL_TABLET | Freq: Every day | ORAL | 3 refills | Status: DC

## 2017-07-07 NOTE — Telephone Encounter (Signed)
Pt called and left a message that she was needing more samples of myrbetriq. Made pt aware [facility] is no longer able to provide long term samples to pt anymore as we are not getting many samples from the companies. Pt became very upset stating she is not able to leave the house if she is not on the medication. Offered pt a rx for medication. Pt stated that will not be good enough as she cant afford $300 a month. Made pt aware will complete PA with insurance company. Pt once again stated this is not fair and she wouldn't be able to leave the house without the medication and then hung up.

## 2017-07-12 ENCOUNTER — Telehealth: Payer: Self-pay | Admitting: Urology

## 2017-07-12 ENCOUNTER — Ambulatory Visit: Payer: Self-pay | Admitting: Physical Therapy

## 2017-07-12 NOTE — Telephone Encounter (Signed)
Patient called and said she wanted to change the day she does her PTNS and the time I told her we only did them on Wednesday and she said ok then she asked if she could do it in the afternoon I told her I would have to check with you.  Please advise on a different time?   Sharyn Lull

## 2017-07-12 NOTE — Telephone Encounter (Signed)
Done ° ° °Michelle °

## 2017-07-12 NOTE — Telephone Encounter (Signed)
Patient may have a 1:30 or a 2:30 time slot for the PTNS.

## 2017-07-14 ENCOUNTER — Ambulatory Visit: Payer: Self-pay | Admitting: Physical Therapy

## 2017-07-20 ENCOUNTER — Ambulatory Visit: Payer: PPO | Attending: Primary Care | Admitting: Physical Therapy

## 2017-07-20 ENCOUNTER — Encounter: Payer: Self-pay | Admitting: Physical Therapy

## 2017-07-20 DIAGNOSIS — R2681 Unsteadiness on feet: Secondary | ICD-10-CM | POA: Diagnosis not present

## 2017-07-20 DIAGNOSIS — M6281 Muscle weakness (generalized): Secondary | ICD-10-CM | POA: Diagnosis not present

## 2017-07-20 DIAGNOSIS — M542 Cervicalgia: Secondary | ICD-10-CM | POA: Diagnosis not present

## 2017-07-20 DIAGNOSIS — R2689 Other abnormalities of gait and mobility: Secondary | ICD-10-CM | POA: Insufficient documentation

## 2017-07-20 NOTE — Therapy (Signed)
[facility] [address] [place], [place], [address] Phone: [phone]   Fax:  [phone]  Physical Therapy Treatment  Patient Details  Name: Emily Moon MRN: [id number] Date of Birth: [birth year] Referring Provider: Owens Loffler MD   Encounter Date: 07/20/2017      PT End of Session - 07/20/17 1434    Visit Number 7   Number of Visits 18   Date for PT Re-Evaluation 07/23/17   Authorization Type [employer] G-code   PT Start Time 8250   PT Stop Time 1058   PT Time Calculation (min) 43 min   Activity Tolerance Patient tolerated treatment well   Behavior During Therapy [facility] for tasks assessed/performed      Past Medical History:  Diagnosis Date  . Arthritis    osteo  . Asthma   . Bipolar disorder ([place]) 05/21/14  . Cataracts, bilateral   . DDD (degenerative disc disease), cervical    also back  . Depression   . Dizziness    Positional  . Edema    feet/legs  . Fibromyalgia syndrome   . Fungal infection    Finger nails  . GERD (gastroesophageal reflux disease)   . Gout   . Headache    seasonal allergies  . Heart palpitations   . 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 Hypercholesterolemia   . Multiple sclerosis (HCC)    weakness  . Nephrolithiasis    kidney stones  . Osteoporosis    osteoarthritis  . Pneumonia   . PONV (postoperative nausea and vomiting)    no problem after cataract surgery  . Psoriasis   . Renal stone   . Shortness of breath dyspnea    wheezing  . Sleep apnea 2012   sleep study / slight, no interventions  . Urinary frequency     Past Surgical History:  Procedure Laterality Date  . CATARACT EXTRACTION W/PHACO Left 05/21/2015   Procedure: CATARACT EXTRACTION PHACO AND INTRAOCULAR LENS PLACEMENT (IOC);  Surgeon: Birder Robson, MD;  Location: [facility];  Service: Ophthalmology;  Laterality: Left;  [place] 00:35 AP% 22.9 CDE 8.11 fluid pack lot  #[id number]  . CATARACT EXTRACTION W/PHACO Right 06/04/2015   Procedure: CATARACT EXTRACTION PHACO AND INTRAOCULAR LENS PLACEMENT (IOC);  Surgeon: Birder Robson, MD;  Location: [facility];  Service: Ophthalmology;  Laterality: Right;  US:00:48 AP%: 10.5 CDE:5.08 Fluid lot #[id number]  . CYSTOSCOPY/URETEROSCOPY/HOLMIUM LASER/STENT PLACEMENT Bilateral 09/22/2016   Procedure: CYSTOSCOPY/URETEROSCOPY/HOLMIUM LASER/STENT PLACEMENT;  Surgeon: Hollice Espy, MD;  Location: [facility];  Service: Urology;  Laterality: Bilateral;  . EYE SURGERY  2015   tissue biopsy  . FOOT SURGERY  2015  . JOINT REPLACEMENT Left 2013   hip replacement  . LITHOTRIPSY    . PTOSIS REPAIR Bilateral 02/18/2016   Procedure: BILATERAL PTOSIS REPAIR UPPER EYELIDS;  Surgeon: Karle Starch, MD;  Location: [facility];  Service: Ophthalmology;  Laterality: Bilateral;  LEAVE PT EARLY AM  . thumb surgery Right   . TONSILLECTOMY  1973    There were no vitals filed for this visit.      Subjective Assessment - 07/20/17 1015    Subjective No falls. She does her exercises.    Pertinent History bi-polar disorder, history of L THA  2013, OA needs Right THA, gout, DDD, chronic pain/fibromyalgia, MS, lower leg numbness, positional dizziness, osteoporosis, kidney stones   Limitations Lifting;Standing;Walking;House hold activities;Sitting   Patient Stated Goals to get my RLE strong again, walk without reliance on  cane    Currently in Pain? Yes   Pain Score 1    Pain Location Neck   Pain Orientation Mid   Pain Descriptors / Indicators Throbbing   Pain Type Chronic pain   Pain Onset More than a month ago   Pain Frequency Intermittent   Aggravating Factors  sleeping   Pain Relieving Factors stretches   Pain Score 0   Pain Location Hip   Pain Orientation Right   Pain Type Chronic pain   Pain Onset More than a month ago   Pain Frequency Intermittent            [facility] PT Assessment - 07/20/17 1015      Observation/Other  Assessments   Focus on Therapeutic Outcomes (FOTO)  53.53  Initial was 50.70    Activities of Balance Confidence Scale (ABC Scale)  64.4%  Initial was 45.6%     AROM   Cervical Flexion 38  8/9 was 27   Cervical Extension 45  8/9 was 34   Cervical - Right Side Bend 24  8/9 was 16   Cervical - Left Side Bend 24  8/9 was 13   Cervical - Right Rotation 24  8/9 was 20   Cervical - Left Rotation 27  8/9 was 11     Ambulation/Gait   Gait velocity 3.16 ft/sec comfortable, 3.89 ft/sec fast pace     Berg Balance Test   Sit to Stand Able to stand without using hands and stabilize independently   Standing Unsupported Able to stand safely 2 minutes   Sitting with Back Unsupported but Feet Supported on Floor or Stool Able to sit safely and securely 2 minutes   Stand to Sit Sits safely with minimal use of hands   Transfers Able to transfer safely, minor use of hands   Standing Unsupported with Eyes Closed Able to stand 10 seconds safely   Standing Ubsupported with Feet Together Able to place feet together independently and stand 1 minute safely   From Standing, Reach Forward with Outstretched Arm Can reach confidently >25 cm (10")   From Standing Position, Pick up Object from Floor Able to pick up shoe safely and easily   From Standing Position, Turn to Look Behind Over each Shoulder Looks behind from both sides and weight shifts well   Turn 360 Degrees Able to turn 360 degrees safely in 4 seconds or less   Standing Unsupported, Alternately Place Feet on Step/Stool Able to stand independently and safely and complete 8 steps in 20 seconds   Standing Unsupported, One Foot in Front Able to place foot tandem independently and hold 30 seconds   Standing on One Leg Able to lift leg independently and hold 5-10 seconds   Total Score 55     Functional Gait  Assessment   Gait assessed  Yes   Gait Level Surface Walks 20 ft in less than 5.5 sec, no assistive devices, good speed, no evidence for  imbalance, normal gait pattern, deviates no more than 6 in outside of the 12 in walkway width.   Change in Gait Speed Able to smoothly change walking speed without loss of balance or gait deviation. Deviate no more than 6 in outside of the 12 in walkway width.   Gait with Horizontal Head Turns Performs head turns smoothly with no change in gait. Deviates no more than 6 in outside 12 in walkway width   Gait with Vertical Head Turns Performs head turns with no change in gait.  Deviates no more than 6 in outside 12 in walkway width.   Gait and Pivot Turn Pivot turns safely within 3 sec and stops quickly with no loss of balance.   Step Over Obstacle Is able to step over 2 stacked shoe boxes taped together (9 in total height) without changing gait speed. No evidence of imbalance.   Gait with Narrow Base of Support Is able to ambulate for 10 steps heel to toe with no staggering.   Gait with Eyes Closed Walks 20 ft, no assistive devices, good speed, no evidence of imbalance, normal gait pattern, deviates no more than 6 in outside 12 in walkway width. Ambulates 20 ft in less than 7 sec.   Ambulating Backwards Walks 20 ft, uses assistive device, slower speed, mild gait deviations, deviates 6-10 in outside 12 in walkway width.   Steps Alternating feet, must use rail.   Total Score 28                             PT Education - 07/20/17 1015    Education provided Yes   Education Details reviewed HEP and need for ongoing exercise for flexibility, strength & balance   Person(s) Educated Patient   Methods Explanation   Comprehension Verbalized understanding          PT Short Term Goals - 06/24/17 1430      PT SHORT TERM GOAL #1   Title Patient will verbalize understanding and return demonstration for initial HEP to increase LE strength and flexibility to decrease pain in R hip. (TARGET DATE: 06/25/2017)    Baseline 06/22/17:  met today   Status Achieved     PT SHORT TERM GOAL #2    Title Patient will report R hip pain as </= 4-5/10 when performing bed mobility including rolling to both sides and transferring supine <> sit to indicate improvement in R hip pain. (TARGET DATE: 06/25/2017)    Baseline 06/22/17: pt reports it varies. if she's in the right position with body pillow, its low, 4-5/10. sitll wakes her up when she rolls at times as well.    Status Partially Met     PT SHORT TERM GOAL #3   Title Patient reports >25% improvement in right hip pain with standing & gait activities. (Target Date: 06/25/2017)   Baseline 06/22/17: yes for today, however it varies day to day depending on her activity leve;l   Status Partially Met     PT SHORT TERM GOAL #4   Title Patient's 90/90 hamstring measurement on R side will be improve to >/= -12* from full knee extension with reports of an increase in pain as </=3 points higher than her baseline report of pain (on a 10 point pain scale) to indicate improvement in LE flexibility and a decrease in pain. (TARGET DATE: 06/25/2017)    Baseline MET 06/24/2017 right -12*, left -10*   Time 1   Period Months   Status Achieved           PT Long Term Goals - 07/20/17 1434      PT LONG TERM GOAL #1   Title Patient will verbalize understanding and return demonstration of ongoing HEP to increase LE strength and flexibility to decrease R hip pain and risk of falling. (TARGET DATE: 07/23/2017)    Baseline MET 07/20/17    Time 2   Period Months   Status Achieved     PT LONG TERM GOAL #2  Title Patient will report R hip pain as </= 3/10 when performing bed mobility including rolling to both sides and transferring supine <> sit to indicate improvement in R hip pain. (TARGET DATE: 07/23/2017)    Baseline MET 07/20/17  patient reporting no hip pain   Time 2   Period Months   Status Achieved     PT LONG TERM GOAL #3   Title Patient reports >50% improvement in pain with standing & gait activities. (Target Date: 07/23/2017)   Baseline MET 07/20/17 Patient  reported increase only 2 increments & initially increased 4-6 increments on 0-10 scale   Time 2   Period Months   Status Achieved     PT LONG TERM GOAL #4   Title Patient's 90/90 hamstring measurement on R side will be improve to >/= -8* from full knee extension without an increase in pain to indicate improvement in LE flexibility and a decrease in pain. (TARGET DATE: 07/23/2017)    Baseline MET 07/20/17    Time 2   Period Months   Status Achieved     PT LONG TERM GOAL #5   Title Patient reports a >10% improvement in ABC scale using FOTO (TARGET DATE: 07/23/2017)    Baseline MET 07/20/17 ABC from 45.6% to 64.4%   Time 2   Period Months   Status Achieved     PT LONG TERM GOAL #6   Title Patient AROM for cervical flexion, extension and rotation rt/lt improves >/= 10* Target Date (07/23/2017)   Time 4   Period Weeks   Status Achieved     PT LONG TERM GOAL #7   Title Patient reports cervical pain </=7/10 with cell phone or computer use. (Target Date: 07/23/2017)   Baseline MET 07/20/17    Time 4   Period Weeks   Status Achieved     PT LONG TERM GOAL #8   Title Patient demonstrates understanding of cervical HEP & posture with sitting & standing activities. (Target Date: 07/23/2017)   Baseline MET 07/20/17    Time 4   Period Weeks   Status Achieved               Plan - 07/20/17 1438    Clinical Impression Statement Patient met all LTGs set for this certification period. She continues to have chronic pain issues but appears to have a better understanding how to manage the pain. She reports higher level of mobility without pain increasing as much. Her Activities of Balance Confindence score improved 19% which is significant.    Rehab Potential Good   Clinical Impairments Affecting Rehab Potential bi-polar disorder, history of L THA  2013, OA needs Right THA, gout, DDD, chronic pain/fibromyalgia, MS, lower leg nuumbness, positional dizziness, osteoporosis, kidney stones   PT Frequency 2x /  week   PT Duration Other (comment)  4 weeks (from 05/21/8831 recert)   PT Treatment/Interventions ADLs/Self Care Home Management;DME Instruction;Gait training;Stair training;Functional mobility training;Therapeutic activities;Therapeutic exercise;Balance training;Neuromuscular re-education;Patient/family education;Electrical Stimulation;Moist Heat;Ultrasound;Cryotherapy   PT Next Visit Plan discharge   Consulted and Agree with Plan of Care Patient      Patient will benefit from skilled therapeutic intervention in order to improve the following deficits and impairments:  Abnormal gait, Decreased activity tolerance, Decreased balance, Decreased endurance, Decreased knowledge of precautions, Decreased mobility, Decreased strength, Dizziness, Impaired flexibility, Postural dysfunction, Difficulty walking, Pain  Visit Diagnosis: Muscle weakness (generalized)  Unsteadiness on feet  Other abnormalities of gait and mobility  Cervicalgia  G-Codes - 07/20/17 1441    Functional Assessment Tool Used (Outpatient Only) No pain in right hip at discharge. Cervical pain increased 2 increments with activities.    Functional Limitation Other PT primary   Other PT Primary Goal Status ([id number]) At least 20 percent but less than 40 percent impaired, limited or restricted   Other PT Primary Discharge Status [phone]) At least 20 percent but less than 40 percent impaired, limited or restricted      Problem List Patient Active Problem List   Diagnosis Date Noted  . Cough 03/30/2017  . Urinary frequency 03/30/2017  . B12 deficiency 05/22/2016  . Dizziness 03/16/2016  . Medicare annual wellness visit, subsequent 10/25/2015  . Back pain 10/31/2014  . Bipolar disorder ([place]) 05/21/2014  . 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 Insomnia 09/01/2011  . Obesity 05/04/2011  . HLD (hyperlipidemia)  01/20/2011  . SMOKER 01/20/2011  . Chronic pain syndrome 01/07/2011  . RENAL CALCULUS, RECURRENT 11/13/2010  . MULTIPLE SCLEROSIS, PROGRESSIVE/RELAPSING 08/28/2010  . GERD 08/28/2010  . Osteoarthritis 08/28/2010  . NEPHROLITHIASIS, HX OF 08/28/2010   PHYSICAL THERAPY DISCHARGE SUMMARY  Visits from Start of Care: 7  Current functional level related to goals / functional outcomes: See above   Remaining deficits: See above   Education / Equipment: HEP & pain management  Plan: Patient agrees to discharge.  Patient goals were met. Patient is being discharged due to meeting the stated rehab goals.  ?????         Jamey Reas PT, DPT 07/20/2017, 2:42 PM  [facility] [address] [place], [place], [address] Phone: [phone]   Fax:  [phone]  Name: Emily Moon MRN: [id number] Date of Birth: 1961/03/14

## 2017-07-20 NOTE — Patient Instructions (Addendum)
Piriformis Stretch, Sitting    Sit, one ankle on opposite knee, same-side hand on crossed knee. Push down on knee, keeping spine straight. Lean torso forward, with flat back, until tension is felt in hamstrings and gluteals of crossed-leg side. Hold ___ seconds.  Repeat ___ times per session. Do ___ sessions per day.  Copyright  VHI. All rights reserved.  Supine: Leg Stretch With Strap (Basic)    Lie on back with one knee bent, foot flat on floor. Hook strap around other foot. Straighten knee. Keep knee level with other knee. Hold ___ seconds. Relax leg completely down to floor.  Repeat ___ times per session. Do ___ sessions per day.  Copyright  VHI. All rights reserved.  Hip Adductor With Hip Flexed 90 Degrees    Wrap strap around left leg, outside calf, inside thigh, hold in same-side hand. With leg in maximal straight leg raise, pull leg out to side. Control leg movement with arm. Keep opposite leg still. Do not rotate pelvis or trunk. Hold ___ seconds. Relax leg. If control of leg movement is difficult, wrap strap inside calf, outside thigh. Repeat ___ times.  Copyright  VHI. All rights reserved.  Hip Adductor With Hip Flexed 90 Degrees    Wrap strap around left leg, outside calf, inside thigh, hold in same-side hand. With leg in maximal straight leg raise, pull leg out to side. Control leg movement with arm. Keep opposite leg still. Do not rotate pelvis or trunk. Hold ___ seconds. Relax leg. If control of leg movement is difficult, wrap strap inside calf, outside thigh. Repeat ___ times.  Copyright  VHI. All rights reserved.  Hip Adductor With Hip Flexed 90 Degrees    Wrap strap around left leg, outside calf, inside thigh, hold in same-side hand. With leg in maximal straight leg raise, pull leg out to side. Control leg movement with arm. Keep opposite leg still. Do not rotate pelvis or trunk. Hold ___ seconds. Relax leg. If control of leg movement is difficult, wrap  strap inside calf, outside thigh. Repeat ___ times.  Copyright  VHI. All rights reserved.  Hip Abductor With Hip Flexed 90 Degrees    Wrap strap around left leg, inside calf, outside thigh, hold in opposite-side hand. With leg at maximal straight leg raise, pull leg over to other side. Move free arm out at 90, turn head toward free arm. Hold ___ seconds. Relax leg. Repeat ___ times.  Copyright  VHI. All rights reserved.  Pelvic Tilt: Posterior - Legs Bent (Supine)    Tighten stomach and flatten back by rolling pelvis down. Hold ____ seconds. Relax. Repeat ____ times per set. Do ____ sets per session. Do ____ sessions per day.  http://orth.exer.us/203   Copyright  VHI. All rights reserved.  Clam Shell 45 Degrees    Lying with hips and knees bent 45, one pillow between knees and ankles. Lift knee. Be sure pelvis does not roll backward. Do not arch back. Do ___ times, each leg, ___ times per day.  http://ss.exer.us/75   Copyright  VHI. All rights reserved.  Bridge    Lying on back, legs bent 90, feet flat on floor. Press up hips and torso, reaching hands to feet. Hold for ____ breaths. ADVANCED: Clasp hands underneath back and squeeze shoulder blades together, lifting upper body onto outside of shoulders.  Copyright  VHI. All rights reserved.

## 2017-07-26 ENCOUNTER — Ambulatory Visit: Payer: Self-pay | Admitting: Physical Therapy

## 2017-07-26 ENCOUNTER — Encounter: Payer: Self-pay | Admitting: Primary Care

## 2017-07-27 ENCOUNTER — Encounter: Payer: Self-pay | Admitting: Primary Care

## 2017-07-27 ENCOUNTER — Ambulatory Visit (INDEPENDENT_AMBULATORY_CARE_PROVIDER_SITE_OTHER): Payer: PPO | Admitting: Primary Care

## 2017-07-27 VITALS — BP 122/76 | HR 92 | Temp 99.5°F | Ht 69.0 in | Wt 211.1 lb

## 2017-07-27 DIAGNOSIS — R509 Fever, unspecified: Secondary | ICD-10-CM | POA: Diagnosis not present

## 2017-07-27 DIAGNOSIS — R350 Frequency of micturition: Secondary | ICD-10-CM

## 2017-07-27 LAB — BASIC METABOLIC PANEL
BUN: 11 mg/dL (ref 6–23)
CHLORIDE: 100 meq/L (ref 96–112)
CO2: 26 mEq/L (ref 19–32)
Calcium: 9.3 mg/dL (ref 8.4–10.5)
Creatinine, Ser: 0.9 mg/dL (ref 0.40–1.20)
GFR: 69.02 mL/min (ref >60.00)
Glucose, Bld: 101 mg/dL — ABNORMAL HIGH (ref 70–99)
POTASSIUM: 4 meq/L (ref 3.5–5.1)
Sodium: 137 mEq/L (ref 135–145)

## 2017-07-27 LAB — CBC WITH DIFFERENTIAL/PLATELET
BASOS ABS: 0.1 10*3/uL (ref 0.0–0.1)
Basophils Relative: 0.7 % (ref 0.0–3.0)
Eosinophils Absolute: 0.3 10*3/uL (ref 0.0–0.7)
Eosinophils Relative: 4.3 % (ref 0.0–5.0)
HCT: 40.8 % (ref 36.0–46.0)
Hemoglobin: 13.6 g/dL (ref 12.0–15.0)
LYMPHS PCT: 30.1 % (ref 12.0–46.0)
Lymphs Abs: 2.1 10*3/uL (ref 0.7–4.0)
MCHC: 33.3 g/dL (ref 30.0–36.0)
MCV: 96.4 fl (ref 78.0–100.0)
MONOS PCT: 6.6 % (ref 3.0–12.0)
Monocytes Absolute: 0.5 10*3/uL (ref 0.1–1.0)
NEUTROS ABS: 4.1 10*3/uL (ref 1.4–7.7)
NEUTROS PCT: 58.3 % (ref 43.0–77.0)
PLATELETS: 326 10*3/uL (ref 150.0–400.0)
RBC: 4.23 Mil/uL (ref 3.87–5.11)
RDW: 13.2 % (ref 11.5–15.5)
WBC: 7 10*3/uL (ref 4.0–10.5)

## 2017-07-27 LAB — MAGNESIUM: MAGNESIUM: 2.2 mg/dL (ref 1.5–2.5)

## 2017-07-27 LAB — POC URINALSYSI DIPSTICK (AUTOMATED)
Bilirubin, UA: NEGATIVE
Glucose, UA: NEGATIVE
Ketones, UA: NEGATIVE
LEUKOCYTES UA: NEGATIVE
NITRITE UA: NEGATIVE
PH UA: 6 (ref 5.0–8.0)
PROTEIN UA: NEGATIVE
RBC UA: NEGATIVE
Spec Grav, UA: 1.01 (ref 1.010–1.025)
Urobilinogen, UA: NEGATIVE E.U./dL — AB

## 2017-07-27 NOTE — Progress Notes (Signed)
Subjective:    Patient ID: Emily Moon, female    DOB: 09/30/1961, 56 y.o.   MRN: [id number]  HPI  Emily Moon is a 56 year old female with a history of recurrent renal calculus, urinary tract infection, multiple sclerosis who presents today with a chief complaint of fever. She also reports fatigue, urinary frequency. Her fevers are running 99.0-100 on average and have been so for the past 1 week. She's been taking tylenol with temporary improvement.   She denies cough, abdominal pain, sore throat, hematuria, vaginal symptoms.  Her fevers are worse at night and upon rising in the morning. She's been hydrating with plenty of water.   Review of Systems  Constitutional: Positive for fatigue.  HENT: Positive for postnasal drip.   Respiratory: Negative for cough and shortness of breath.   Cardiovascular: Negative for chest pain.  Musculoskeletal: Positive for arthralgias.  Allergic/Immunologic: Positive for environmental allergies.       Past Medical History:  Diagnosis Date  . Arthritis    osteo  . Asthma   . Bipolar disorder ([place]) 05/21/14  . Cataracts, bilateral   . DDD (degenerative disc disease), cervical    also back  . Depression   . Dizziness    Positional  . Edema    feet/legs  . Fibromyalgia syndrome   . Fungal infection    Finger nails  . GERD (gastroesophageal reflux disease)   . Gout   . Headache    seasonal allergies  . Heart palpitations   . 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 Hypercholesterolemia   . Multiple sclerosis (HCC)    weakness  . Nephrolithiasis    kidney stones  . Osteoporosis    osteoarthritis  . Pneumonia   . PONV (postoperative nausea and vomiting)    no problem after cataract surgery  . Psoriasis   . Renal stone   . Shortness of breath dyspnea    wheezing  . Sleep apnea 2012   sleep study / slight, no interventions  . Urinary frequency      Social History   Social History  . Marital status: Divorced    Spouse  name: N/A  . Number of children: 1  . Years of education: N/A   Occupational History  . Customer Service Rep at [employer]  . Smoking status: Former Smoker    Years: 25.00    Types: Cigarettes    Quit date: 11/16/2012  . Smokeless tobacco: Never Used     Comment: occasional use  . Alcohol use No  . Drug use: Yes    Types: Methylphenidate  . Sexual activity: No   Other Topics Concern  . Not on file   Social History Narrative  . No narrative on file    Past Surgical History:  Procedure Laterality Date  . CATARACT EXTRACTION W/PHACO Left 05/21/2015   Procedure: CATARACT EXTRACTION PHACO AND INTRAOCULAR LENS PLACEMENT (IOC);  Surgeon: Birder Robson, MD;  Location: [facility];  Service: Ophthalmology;  Laterality: Left;  [place] 00:35 AP% 22.9 CDE 8.11 fluid pack lot #[id number]  . CATARACT EXTRACTION W/PHACO Right 06/04/2015   Procedure: CATARACT EXTRACTION PHACO AND INTRAOCULAR LENS PLACEMENT (IOC);  Surgeon: Birder Robson, MD;  Location: [facility];  Service: Ophthalmology;  Laterality: Right;  US:00:48 AP%: 10.5 CDE:5.08 Fluid lot #[id number] H  . CYSTOSCOPY/URETEROSCOPY/HOLMIUM LASER/STENT PLACEMENT Bilateral 09/22/2016   Procedure: CYSTOSCOPY/URETEROSCOPY/HOLMIUM LASER/STENT PLACEMENT;  Surgeon: Hollice Espy, MD;  Location: [facility];  Service: Urology;  Laterality: Bilateral;  . EYE SURGERY  2015   tissue biopsy  . FOOT SURGERY  2015  . JOINT REPLACEMENT Left 2013   hip replacement  . LITHOTRIPSY    . PTOSIS REPAIR Bilateral 02/18/2016   Procedure: BILATERAL PTOSIS REPAIR UPPER EYELIDS;  Surgeon: Karle Starch, MD;  Location: [place];  Service: Ophthalmology;  Laterality: Bilateral;  LEAVE PT EARLY AM  . thumb surgery Right   . TONSILLECTOMY  1973    Family History  Problem Relation Age of Onset  . Cancer Father        Abdomen with mastasis  . Cancer Mother   . Heart disease Mother   . Kidney disease Neg Hx   . Bladder Cancer  Neg Hx   . Prostate cancer Neg Hx     Allergies  Allergen Reactions  . Albuterol Shortness Of Breath and Other (See Comments)    Makes pt feel jittery/ tacycardic  . Crestor [Rosuvastatin] Other (See Comments)    Joint pain, muscle pain, and hair loss  . Halcion [Triazolam] Other (See Comments)    Dizziness,headaches,bladder problems  . Levaquin [Levofloxacin In D5w] Diarrhea and Itching    Shoulder pain  . Naproxen Sodium Swelling    Patient tolerates in small doses  . Tylenol [Acetaminophen] Swelling    Patient tolerates in small doses  . Cefaclor Other (See Comments)    Doesn't remember---unsure if actually allergic   . Diclofenac Sodium Other (See Comments)    "made very sick"  . Sulfa Antibiotics Itching    Unsure of reaction possibly itching  . Tramadol Itching and Nausea And Vomiting  . Aripiprazole Other (See Comments)    Muscle tension/cramping  . Ibuprofen Swelling    Patient tolerates in small doses    Current Outpatient Prescriptions on File Prior to Visit  Medication Sig Dispense Refill  . ASPERCREME LIDOCAINE EX Apply 1 application topically 4 (four) times daily as needed (for pain.).     Marland Kitchen aspirin 325 MG tablet Take 325 mg by mouth every 4 (four) hours as needed for headache.     . Biotin 10000 MCG TABS Take 1 tablet by mouth daily.    . calcipotriene-betamethasone (TACLONEX) ointment Apply 1 application topically daily as needed (for psorasis).     . Cholecalciferol (VITAMIN D3) 10000 units capsule Take 10,000 Units by mouth daily.    . clobetasol cream (TEMOVATE) 1.61 % Apply 1 application topically 2 (two) times daily. 30 g 2  . cyanocobalamin (,VITAMIN B-12,) 1000 MCG/ML injection Inject 1,000 mcg into the muscle every 30 (thirty) days. For 3 Months (June, July, August)    . diclofenac sodium (VOLTAREN) 1 % GEL Apply 4 g topically 4 (four) times daily. (Patient taking differently: Apply 4 g topically 4 (four) times daily as needed (for pain.). ) 500 g 5  .  docusate sodium (COLACE) 100 MG capsule Take 1 capsule (100 mg total) by mouth 2 (two) times daily. (Patient taking differently: Take 100 mg by mouth 2 (two) times daily as needed. ) 60 capsule 0  . DULoxetine (CYMBALTA) 20 MG capsule Take 2 capsules (40 mg total) by mouth at bedtime. 60 capsule 5  . HYDROcodone-acetaminophen (NORCO) 10-325 MG tablet Take 1 tablet by mouth every 6 (six) hours as needed for moderate pain or severe pain. 120 tablet 0  . HYDROcodone-acetaminophen (NORCO) 10-325 MG tablet Take 1 tablet by mouth every 6 (six) hours as needed for moderate pain or severe pain. 120 tablet  0  . HYDROcodone-acetaminophen (NORCO) 10-325 MG tablet Take 1 tablet by mouth every 6 (six) hours as needed for moderate pain or severe pain. 120 tablet 0  . hydroquinone 4 % cream 1 APPLICATION TOPICALLY DAILY AS NEEDED FOR BLEMISHES.  3  . hydrOXYzine (VISTARIL) 50 MG capsule Take 2 capsules (100 mg total) by mouth at bedtime. 90 capsule 5  . ketorolac (TORADOL) 10 MG tablet Take 1 tablet (10 mg total) by mouth 2 (two) times daily as needed for severe pain. 60 tablet 0  . lamoTRIgine (LAMICTAL) 150 MG tablet Take 2 tablets (300 mg total) by mouth at bedtime. 60 tablet 5  . levalbuterol (XOPENEX HFA) 45 MCG/ACT inhaler Inhale 1-2 puffs into the lungs every 6 (six) hours as needed for wheezing. 1 Inhaler 5  . Lysine 1000 MG TABS Take 2,000-4,000 mg by mouth at bedtime. 2000 mg scheduled at bedtime and patient will take 4000 mg if she has outbreak    . mirabegron ER (MYRBETRIQ) 25 MG TB24 tablet Take 1 tablet (25 mg total) by mouth daily. (Patient taking differently: Take 50 mg by mouth daily. ) 90 tablet 0  . mirabegron ER (MYRBETRIQ) 50 MG TB24 tablet Take 1 tablet (50 mg total) by mouth daily. 30 tablet 3  . neomycin-bacitracin-polymyxin (NEOSPORIN) [phone] ointment Apply 1 application topically 4 (four) times daily as needed (for cut/scrapes.).    Marland Kitchen nicotine (NICODERM CQ - DOSED IN MG/24 HOURS) 21  mg/24hr patch Place 1 patch (21 mg total) onto the skin daily. 28 patch 3  . ondansetron (ZOFRAN ODT) 4 MG disintegrating tablet Take 1 tablet (4 mg total) by mouth every 8 (eight) hours as needed for nausea. 12 tablet 0  . phenazopyridine (AZO-TABS) 95 MG tablet Take 95 mg by mouth 3 (three) times daily as needed for pain.    . polyethylene glycol powder (GLYCOLAX/MIRALAX) powder MIX 17 GRAMS (1 CAPFUL) WITH 4-8 OZ OF LIQUID AND TAKE BY MOUTH TWICE DAILY AS NEEDED (Patient taking differently: MIX 17 GRAMS (1 CAPFUL) WITH 4-8 OZ OF LIQUID AND TAKE BY MOUTH TWICE DAILY AS NEEDED FOR CONSTIPATION) 527 g 0  . tamsulosin (FLOMAX) 0.4 MG CAPS capsule TAKE 1 CAPSULE (0.4 MG TOTAL) BY MOUTH DAILY. 30 capsule 0  . temazepam (RESTORIL) 30 MG capsule TAKE ONE TO TWO CAPSULES BY MOUTH NIGHTLY AT BEDTIME 60 capsule 3  . tiZANidine (ZANAFLEX) 4 MG tablet Take 2 tablets (8 mg total) by mouth every 6 (six) hours as needed for muscle spasms. 720 tablet 3  . zolpidem (AMBIEN) 10 MG tablet Take 1 tablet (10 mg total) by mouth at bedtime. 135 tablet 0  . Zoster Vac Recomb Adjuvanted [facility]) injection Administer into the muscle once. Repeat with second vaccination within 2-6 months after initial vaccination. 0.5 mL 1   No current facility-administered medications on file prior to visit.     BP 122/76   Pulse 92   Temp 99.5 F (37.5 C) (Oral)   Ht 5\' 9"  (1.753 m)   Wt 211 lb 1.9 oz (95.8 kg)   LMP 08/23/2014   SpO2 98%   BMI 31.18 kg/m    Objective:   Physical Exam  Constitutional: She appears well-nourished.  HENT:  Right Ear: Tympanic membrane and ear canal normal.  Left Ear: Tympanic membrane and ear canal normal.  Nose: Right sinus exhibits no maxillary sinus tenderness and no frontal sinus tenderness. Left sinus exhibits no maxillary sinus tenderness and no frontal sinus tenderness.  Mouth/Throat: Oropharynx is  clear and moist.  Eyes: Conjunctivae are normal.  Neck: Neck supple.    Cardiovascular: Normal rate and regular rhythm.   Pulmonary/Chest: Effort normal and breath sounds normal. She has no wheezes. She has no rales.  Abdominal: Soft. Bowel sounds are normal. There is no CVA tenderness.  Lymphadenopathy:    She has no cervical adenopathy.  Skin: Skin is warm and dry.          Assessment & Plan:  Fever:  Present for the past 1 week, temporary improvement with tylenol. Exam today not suspicious for pneumonia, UTI. No other abdominal symptoms. Joint aches could be from weather changes. UA: Clear. No leuks, blood, nitrites.  Check CBC to rule out any other reason for fever. Continue tylenol PRN.  Sheral Flow, NP

## 2017-07-27 NOTE — Patient Instructions (Signed)
Complete lab work prior to leaving today. I will notify you of your results once received.   Continue Tylenol as needed for fevers, body aches.   Your urine looks clean which is good news.  It was a pleasure to see you today!

## 2017-07-27 NOTE — Telephone Encounter (Signed)
Please schedule patient for 10:15 am today.

## 2017-07-28 ENCOUNTER — Ambulatory Visit: Payer: Self-pay | Admitting: Physical Therapy

## 2017-08-03 NOTE — Progress Notes (Signed)
Chief Complaint:  Chief Complaint  Patient presents with  . PTNS    Urinary frequency     HPI: Patient is a 56 year old Caucasian female with multiple sclerosis with a history of nephrolithiasis and urinary frequency who presents today to begin 12 weekly treatments of PTNS.  This will be # 1/12.           Anticholinergics - patient failed Vesicare     Myrbetriq was effective, but she found it cost prohibitive      Contraindications present for PTNS      Pacemaker - NO      Implantable defibrillator - NO      History of abnormal bleeding - NO      History of neuropathies or nerve damage - NO  Discussed with patient possible complications of procedure, such as discomfort, bleeding at insertion/stimulation site, procedure consent signed  Patient goals:      Patient is wanting to have the same control over her urinary symptoms as she experienced with the Myrbetriq.    Reemphasized that most patient's will see benefit by the 8th week of treatment, but about 10 to 20% of patient may be late responders.  If they happen to be late responders, it is important to continue with the therapy beyond the 12 weekly treatments as many of those patient find benefit.  Today, she is experiencing nocturia and incontinence.    She is experiencing 28 daytime voids, 3 night time voids, a strong urgency and continuous leakage.     PMH: Past Medical History:  Diagnosis Date  . Arthritis    osteo  . Asthma   . Bipolar disorder ([place]) 05/21/14  . Cataracts, bilateral   . DDD (degenerative disc disease), cervical    also back  . Depression   . Dizziness    Positional  . Edema    feet/legs  . Fibromyalgia syndrome   . Fungal infection    Finger nails  . GERD (gastroesophageal reflux disease)   . Gout   . Headache    seasonal allergies  . Heart palpitations   . 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 Hypercholesterolemia   . Multiple sclerosis (HCC)    weakness  . Nephrolithiasis      kidney stones  . Osteoporosis    osteoarthritis  . Pneumonia   . PONV (postoperative nausea and vomiting)    no problem after cataract surgery  . Psoriasis   . Renal stone   . Shortness of breath dyspnea    wheezing  . Sleep apnea 2012   sleep study / slight, no interventions  . Urinary frequency     Surgical History: Past Surgical History:  Procedure Laterality Date  . CATARACT EXTRACTION W/PHACO Left 05/21/2015   Procedure: CATARACT EXTRACTION PHACO AND INTRAOCULAR LENS PLACEMENT (IOC);  Surgeon: Birder Robson, MD;  Location: [facility];  Service: Ophthalmology;  Laterality: Left;  [place] 00:35 AP% 22.9 CDE 8.11 fluid pack lot #[id number] H  . CATARACT EXTRACTION W/PHACO Right 06/04/2015   Procedure: CATARACT EXTRACTION PHACO AND INTRAOCULAR LENS PLACEMENT (IOC);  Surgeon: Birder Robson, MD;  Location: [facility];  Service: Ophthalmology;  Laterality: Right;  US:00:48 AP%: 10.5 CDE:5.08 Fluid lot #[id number] H  . CYSTOSCOPY/URETEROSCOPY/HOLMIUM LASER/STENT PLACEMENT Bilateral 09/22/2016   Procedure: CYSTOSCOPY/URETEROSCOPY/HOLMIUM LASER/STENT PLACEMENT;  Surgeon: Hollice Espy, MD;  Location: [facility];  Service: Urology;  Laterality: Bilateral;  . EYE SURGERY  2015   tissue biopsy  . FOOT SURGERY  2015  . JOINT REPLACEMENT Left  2013   hip replacement  . LITHOTRIPSY    . PTOSIS REPAIR Bilateral 02/18/2016   Procedure: BILATERAL PTOSIS REPAIR UPPER EYELIDS;  Surgeon: Karle Starch, MD;  Location: [place];  Service: Ophthalmology;  Laterality: Bilateral;  LEAVE PT EARLY AM  . thumb surgery Right   . TONSILLECTOMY  1973    Home Medications:  Allergies as of 08/04/2017      Reactions   Albuterol Shortness Of Breath, Other (See Comments)   Makes pt feel jittery/ tacycardic   Crestor [rosuvastatin] Other (See Comments)   Joint pain, muscle pain, and hair loss   Halcion [triazolam] Other (See Comments)   Dizziness,headaches,bladder problems   Levaquin [levofloxacin In D5w]  Diarrhea, Itching   Shoulder pain   Naproxen Sodium Swelling   Patient tolerates in small doses   Tylenol [acetaminophen] Swelling   Patient tolerates in small doses   Cefaclor Other (See Comments)   Doesn't remember---unsure if actually allergic    Diclofenac Sodium Other (See Comments)   "made very sick"   Sulfa Antibiotics Itching   Unsure of reaction possibly itching   Tramadol Itching, Nausea And Vomiting   Aripiprazole Other (See Comments)   Muscle tension/cramping   Ibuprofen Swelling   Patient tolerates in small doses      Medication List       Accurate as of 08/04/17  4:02 PM. Always use your most recent med list.          ASPERCREME LIDOCAINE EX Apply 1 application topically 4 (four) times daily as needed (for pain.).   aspirin 325 MG tablet Take 325 mg by mouth every 4 (four) hours as needed for headache.   AZO-TABS 95 MG tablet Generic drug:  phenazopyridine Take 95 mg by mouth 3 (three) times daily as needed for pain.   Biotin 10000 MCG Tabs Take 1 tablet by mouth daily.   calcipotriene-betamethasone ointment Commonly known as:  TACLONEX Apply 1 application topically daily as needed (for psorasis).   clobetasol cream 0.05 % Commonly known as:  TEMOVATE Apply 1 application topically 2 (two) times daily.   cyanocobalamin 1000 MCG/ML injection Commonly known as:  (VITAMIN B-12) Inject 1,000 mcg into the muscle every 30 (thirty) days. For 3 Months (June, July, August)   diclofenac sodium 1 % Gel Commonly known as:  VOLTAREN Apply 4 g topically 4 (four) times daily.   docusate sodium 100 MG capsule Commonly known as:  COLACE Take 1 capsule (100 mg total) by mouth 2 (two) times daily.   DULoxetine 20 MG capsule Commonly known as:  CYMBALTA Take 2 capsules (40 mg total) by mouth at bedtime.   HYDROcodone-acetaminophen 10-325 MG tablet Commonly known as:  NORCO Take 1 tablet by mouth every 6 (six) hours as needed for moderate pain or severe  pain.   HYDROcodone-acetaminophen 10-325 MG tablet Commonly known as:  NORCO Take 1 tablet by mouth every 6 (six) hours as needed for moderate pain or severe pain.   HYDROcodone-acetaminophen 10-325 MG tablet Commonly known as:  NORCO Take 1 tablet by mouth every 6 (six) hours as needed for moderate pain or severe pain.   hydroquinone 4 % cream 1 APPLICATION TOPICALLY DAILY AS NEEDED FOR BLEMISHES.   hydrOXYzine 50 MG capsule Commonly known as:  VISTARIL Take 2 capsules (100 mg total) by mouth at bedtime.   ketorolac 10 MG tablet Commonly known as:  TORADOL Take 1 tablet (10 mg total) by mouth 2 (two) times daily as needed for  severe pain.   lamoTRIgine 150 MG tablet Commonly known as:  LAMICTAL Take 2 tablets (300 mg total) by mouth at bedtime.   levalbuterol 45 MCG/ACT inhaler Commonly known as:  XOPENEX HFA Inhale 1-2 puffs into the lungs every 6 (six) hours as needed for wheezing.   Lysine 1000 MG Tabs Take 2,000-4,000 mg by mouth at bedtime. 2000 mg scheduled at bedtime and patient will take 4000 mg if she has outbreak   mirabegron ER 25 MG Tb24 tablet Commonly known as:  MYRBETRIQ Take 1 tablet (25 mg total) by mouth daily.   mirabegron ER 50 MG Tb24 tablet Commonly known as:  MYRBETRIQ Take 1 tablet (50 mg total) by mouth daily.   neomycin-bacitracin-polymyxin [phone] ointment Apply 1 application topically 4 (four) times daily as needed (for cut/scrapes.).   nicotine 21 mg/24hr patch Commonly known as:  NICODERM CQ - dosed in mg/24 hours Place 1 patch (21 mg total) onto the skin daily.   ondansetron 4 MG disintegrating tablet Commonly known as:  ZOFRAN ODT Take 1 tablet (4 mg total) by mouth every 8 (eight) hours as needed for nausea.   polyethylene glycol powder powder Commonly known as:  GLYCOLAX/MIRALAX MIX 17 GRAMS (1 CAPFUL) WITH 4-8 OZ OF LIQUID AND TAKE BY MOUTH TWICE DAILY AS NEEDED   tamsulosin 0.4 MG Caps capsule Commonly known as:   FLOMAX TAKE 1 CAPSULE (0.4 MG TOTAL) BY MOUTH DAILY.   temazepam 30 MG capsule Commonly known as:  RESTORIL TAKE ONE TO TWO CAPSULES BY MOUTH NIGHTLY AT BEDTIME   tiZANidine 4 MG tablet Commonly known as:  ZANAFLEX Take 2 tablets (8 mg total) by mouth every 6 (six) hours as needed for muscle spasms.   Vitamin D3 10000 units capsule Take 10,000 Units by mouth daily.   zolpidem 10 MG tablet Commonly known as:  AMBIEN Take 1 tablet (10 mg total) by mouth at bedtime.   Zoster Vac Recomb Adjuvanted injection Commonly known as:  SHINGRIX Administer into the muscle once. Repeat with second vaccination within 2-6 months after initial vaccination.            Discharge Care Instructions        Start     Ordered   08/04/17 0000  PTNS-Percutaneous Tibial Nerve Stimulati    Question:  Porcedure Location  Answer:  [facility]   08/04/17 1421      Allergies:  Allergies  Allergen Reactions  . Albuterol Shortness Of Breath and Other (See Comments)    Makes pt feel jittery/ tacycardic  . Crestor [Rosuvastatin] Other (See Comments)    Joint pain, muscle pain, and hair loss  . Halcion [Triazolam] Other (See Comments)    Dizziness,headaches,bladder problems  . Levaquin [Levofloxacin In D5w] Diarrhea and Itching    Shoulder pain  . Naproxen Sodium Swelling    Patient tolerates in small doses  . Tylenol [Acetaminophen] Swelling    Patient tolerates in small doses  . Cefaclor Other (See Comments)    Doesn't remember---unsure if actually allergic   . Diclofenac Sodium Other (See Comments)    "made very sick"  . Sulfa Antibiotics Itching    Unsure of reaction possibly itching  . Tramadol Itching and Nausea And Vomiting  . Aripiprazole Other (See Comments)    Muscle tension/cramping  . Ibuprofen Swelling    Patient tolerates in small doses    Family History: Family History  Problem Relation Age of Onset  . Cancer Father  Abdomen with mastasis  .  Cancer Mother   . Heart disease Mother   . Kidney disease Neg Hx   . Bladder Cancer Neg Hx   . Prostate cancer Neg Hx   . Kidney cancer Neg Hx     Social History:  reports that she quit smoking about 4 years ago. Her smoking use included Cigarettes. She quit after 25.00 years of use. She has never used smokeless tobacco. She reports that she uses drugs, including Methylphenidate. She reports that she does not drink alcohol.  ROS: UROLOGY Frequent Urination?: Yes Hard to postpone urination?: Yes Burning/pain with urination?: No Get up at night to urinate?: Yes Leakage of urine?: Yes Urine stream starts and stops?: Yes Trouble starting stream?: Yes Do you have to strain to urinate?: Yes Blood in urine?: No Urinary tract infection?: No Sexually transmitted disease?: No Injury to kidneys or bladder?: No Painful intercourse?: No Weak stream?: No Currently pregnant?: No Vaginal bleeding?: No Last menstrual period?: n  Gastrointestinal Nausea?: No Vomiting?: No Indigestion/heartburn?: Yes Diarrhea?: No Constipation?: No  Constitutional Fever: No Night sweats?: Yes Weight loss?: No Fatigue?: Yes  Skin Skin rash/lesions?: Yes Itching?: No  Eyes Blurred vision?: No Double vision?: No  Ears/Nose/Throat Sore throat?: No Sinus problems?: Yes  Hematologic/Lymphatic Swollen glands?: No Easy bruising?: No  Cardiovascular Leg swelling?: No Chest pain?: No  Respiratory Cough?: No Shortness of breath?: No  Endocrine Excessive thirst?: No  Musculoskeletal Back pain?: Yes Joint pain?: Yes  Neurological Headaches?: No Dizziness?: No  Psychologic Depression?: Yes Anxiety?: Yes   Physical Exam: BP 130/76   Pulse 98   Ht 5\' 9"  (1.753 m)   Wt 209 lb 12.8 oz (95.2 kg)   LMP 08/23/2014   BMI 30.98 kg/m   Constitutional: Well nourished. Alert and oriented, No acute distress. HEENT: [place] AT, moist mucus membranes. Trachea midline, no  masses. Cardiovascular: No clubbing, cyanosis, or edema. Respiratory: Normal respiratory effort, no increased work of breathing. Skin: No rashes, bruises or suspicious lesions. Lymph: No cervical or inguinal adenopathy. Neurologic: Grossly intact, no focal deficits, moving all 4 extremities. Psychiatric: Normal mood and affect.  Laboratory Data: Lab Results  Component Value Date   WBC 7.0 07/27/2017   HGB 13.6 07/27/2017   HCT 40.8 07/27/2017   MCV 96.4 07/27/2017   PLT 326.0 07/27/2017    Lab Results  Component Value Date   CREATININE 0.90 07/27/2017    Lab Results  Component Value Date   HGBA1C 6.1 03/18/2017    Lab Results  Component Value Date   TSH 1.14 03/18/2017       Component Value Date/Time   CHOL 316 (H) 03/18/2017 1018   HDL 50.60 03/18/2017 1018   CHOLHDL 6 03/18/2017 1018   VLDL 53.8 (H) 03/18/2017 1018   LDLCALC 223 (H) 11/13/2014 0908    Lab Results  Component Value Date   AST 15 10/10/2016   Lab Results  Component Value Date   ALT 11 (L) 10/10/2016   I have reviewed the labs   PTNS treatment: The needle electrode was inserted into the lower, inner aspect of the patient's left leg. The surface electrode was placed on the inside arch of the foot on the treatment leg. The lead set was connected to the stimulator and the needle electrode clip was connected to the needle electrode. The stimulator that produces an adjustable electrical pulse that travels to the sacral nerve plexus via the tibial nerve was increased to 1 until the patient received  a toe flex and a sensory response.     Assessment & Plan:    1. Frequency  Treatment Plan:  The needle electrode was removed without difficulty to the patient.  Patient tolerated the procedure for 30 minutes.  She will return next week for # 2 out of 12 of their weekly PTNS treatment's    Return in about 1 week (around 08/11/2017) for # 2 PTNS.  These notes generated with voice recognition  software. I apologize for typographical errors.  Zara Council, [facility] [address], [place], [place] [address] [phone]

## 2017-08-04 ENCOUNTER — Encounter: Payer: Self-pay | Admitting: Urology

## 2017-08-04 ENCOUNTER — Ambulatory Visit (INDEPENDENT_AMBULATORY_CARE_PROVIDER_SITE_OTHER): Payer: PPO | Admitting: Urology

## 2017-08-04 VITALS — BP 130/76 | HR 98 | Ht 69.0 in | Wt 209.8 lb

## 2017-08-04 DIAGNOSIS — R350 Frequency of micturition: Secondary | ICD-10-CM

## 2017-08-04 NOTE — Progress Notes (Signed)
PTNS  Session # 1  Health & Social Factors: First [place] Caffeine: 1 Alcohol: 0 Daytime voids #per day: 20 Night-time voids #per night: 3 Urgency: Strong "Comes out of nowhere" Incontinence Episodes #per day: 0 Ankle used: Left Treatment Setting: 1 Feeling/ Response: Both Comments: Leaks all the time  Preformed By: Zara Council PA-C  Assistant: Lyndee Hensen CMA  Follow Up: One week

## 2017-08-10 NOTE — Progress Notes (Signed)
Chief Complaint:  Chief Complaint  Patient presents with  . Urinary Frequency     HPI: Patient is a 56 year old Caucasian female with multiple sclerosis with a history of nephrolithiasis and urinary frequency who presents today to begin 12 weekly treatments of PTNS.  This will be # 2/12.           Anticholinergics - patient failed Vesicare     Myrbetriq was effective, but she found it cost prohibitive      Contraindications present for PTNS      Pacemaker - NO      Implantable defibrillator - NO      History of abnormal bleeding - NO      History of neuropathies or nerve damage - NO  Discussed with patient possible complications of procedure, such as discomfort, bleeding at insertion/stimulation site, procedure consent signed  Patient goals:      Patient is wanting to have the same control over her urinary symptoms as she experienced with the Myrbetriq.    Reemphasized that most patient's will see benefit by the 8th week of treatment, but about 10 to 20% of patient may be late responders.  If they happen to be late responders, it is important to continue with the therapy beyond the 12 weekly treatments as many of those patient find benefit.  Today, she is experiencing frequency, urgency, dysuria, nocturia, incontinence, so draining to urinate and hesitancy.  She is experiencing 18 daytime voids (improved), 1 night time voids (improved), a strong urgency (stable) and 0 leakage (improved).      PMH: Past Medical History:  Diagnosis Date  . Arthritis    osteo  . Asthma   . Bipolar disorder ([place]) 05/21/14  . Cataracts, bilateral   . DDD (degenerative disc disease), cervical    also back  . Depression   . Dizziness    Positional  . Edema    feet/legs  . Fibromyalgia syndrome   . Fungal infection    Finger nails  . GERD (gastroesophageal reflux disease)   . Gout   . Headache    seasonal allergies  . Heart palpitations   . 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  Hypercholesterolemia   . Multiple sclerosis ([facility])    weakness  . Nephrolithiasis    kidney stones  . Osteoporosis    osteoarthritis  . Pneumonia   . PONV (postoperative nausea and vomiting)    no problem after cataract surgery  . Psoriasis   . Renal stone   . Shortness of breath dyspnea    wheezing  . Sleep apnea 2012   sleep study / slight, no interventions  . Urinary frequency     Surgical History: Past Surgical History:  Procedure Laterality Date  . CATARACT EXTRACTION W/PHACO Left 05/21/2015   Procedure: CATARACT EXTRACTION PHACO AND INTRAOCULAR LENS PLACEMENT (IOC);  Surgeon: Birder Robson, MD;  Location: [facility];  Service: Ophthalmology;  Laterality: Left;  [place] 00:35 AP% 22.9 CDE 8.11 fluid pack lot #[id number]  . CATARACT EXTRACTION W/PHACO Right 06/04/2015   Procedure: CATARACT EXTRACTION PHACO AND INTRAOCULAR LENS PLACEMENT (IOC);  Surgeon: Birder Robson, MD;  Location: [facility];  Service: Ophthalmology;  Laterality: Right;  US:00:48 AP%: 10.5 CDE:5.08 Fluid lot #[id number]  . CYSTOSCOPY/URETEROSCOPY/HOLMIUM LASER/STENT PLACEMENT Bilateral 09/22/2016   Procedure: CYSTOSCOPY/URETEROSCOPY/HOLMIUM LASER/STENT PLACEMENT;  Surgeon: Hollice Espy, MD;  Location: [facility];  Service: Urology;  Laterality: Bilateral;  . EYE SURGERY  2015   tissue biopsy  . FOOT SURGERY  2015  . JOINT REPLACEMENT Left 2013   hip replacement  . LITHOTRIPSY    . PTOSIS REPAIR Bilateral 02/18/2016   Procedure: BILATERAL PTOSIS REPAIR UPPER EYELIDS;  Surgeon: Karle Starch, MD;  Location: [place];  Service: Ophthalmology;  Laterality: Bilateral;  LEAVE PT EARLY AM  . thumb surgery Right   . TONSILLECTOMY  1973    Home Medications:  Allergies as of 08/11/2017      Reactions   Albuterol Shortness Of Breath, Other (See Comments)   Makes pt feel jittery/ tacycardic   Crestor [rosuvastatin] Other (See Comments)   Joint pain, muscle pain, and hair loss   Halcion [triazolam] Other (See  Comments)   Dizziness,headaches,bladder problems   Levaquin [levofloxacin In D5w] Diarrhea, Itching   Shoulder pain   Naproxen Sodium Swelling   Patient tolerates in small doses   Tylenol [acetaminophen] Swelling   Patient tolerates in small doses   Cefaclor Other (See Comments)   Doesn't remember---unsure if actually allergic    Diclofenac Sodium Other (See Comments)   "made very sick"   Sulfa Antibiotics Itching   Unsure of reaction possibly itching   Tramadol Itching, Nausea And Vomiting   Aripiprazole Other (See Comments)   Muscle tension/cramping   Ibuprofen Swelling   Patient tolerates in small doses      Medication List       Accurate as of 08/11/17 11:59 PM. Always use your most recent med list.          ASPERCREME LIDOCAINE EX Apply 1 application topically 4 (four) times daily as needed (for pain.).   aspirin 325 MG tablet Take 325 mg by mouth every 4 (four) hours as needed for headache.   AZO-TABS 95 MG tablet Generic drug:  phenazopyridine Take 95 mg by mouth 3 (three) times daily as needed for pain.   Biotin 10000 MCG Tabs Take 1 tablet by mouth daily.   calcipotriene-betamethasone ointment Commonly known as:  TACLONEX Apply 1 application topically daily as needed (for psorasis).   clobetasol cream 0.05 % Commonly known as:  TEMOVATE Apply 1 application topically 2 (two) times daily.   cyanocobalamin 1000 MCG/ML injection Commonly known as:  (VITAMIN B-12) Inject 1,000 mcg into the muscle every 30 (thirty) days. For 3 Months (June, July, August)   diclofenac sodium 1 % Gel Commonly known as:  VOLTAREN Apply 4 g topically 4 (four) times daily.   docusate sodium 100 MG capsule Commonly known as:  COLACE Take 1 capsule (100 mg total) by mouth 2 (two) times daily.   DULoxetine 20 MG capsule Commonly known as:  CYMBALTA Take 2 capsules (40 mg total) by mouth at bedtime.   HYDROcodone-acetaminophen 10-325 MG tablet Commonly known as:   NORCO Take 1 tablet by mouth every 6 (six) hours as needed for moderate pain or severe pain.   hydroquinone 4 % cream 1 APPLICATION TOPICALLY DAILY AS NEEDED FOR BLEMISHES.   hydrOXYzine 50 MG capsule Commonly known as:  VISTARIL Take 2 capsules (100 mg total) by mouth at bedtime.   ketorolac 10 MG tablet Commonly known as:  TORADOL Take 1 tablet (10 mg total) by mouth 2 (two) times daily as needed for severe pain.   lamoTRIgine 150 MG tablet Commonly known as:  LAMICTAL Take 2 tablets (300 mg total) by mouth at bedtime.   levalbuterol 45 MCG/ACT inhaler Commonly known as:  XOPENEX HFA Inhale 1-2 puffs into the lungs every 6 (six) hours as needed for wheezing.  Lysine 1000 MG Tabs Take 2,000-4,000 mg by mouth at bedtime. 2000 mg scheduled at bedtime and patient will take 4000 mg if she has outbreak   mirabegron ER 50 MG Tb24 tablet Commonly known as:  MYRBETRIQ Take 1 tablet (50 mg total) by mouth daily.   neomycin-bacitracin-polymyxin [phone] ointment Apply 1 application topically 4 (four) times daily as needed (for cut/scrapes.).   nicotine 21 mg/24hr patch Commonly known as:  NICODERM CQ - dosed in mg/24 hours Place 1 patch (21 mg total) onto the skin daily.   polyethylene glycol powder powder Commonly known as:  GLYCOLAX/MIRALAX MIX 17 GRAMS (1 CAPFUL) WITH 4-8 OZ OF LIQUID AND TAKE BY MOUTH TWICE DAILY AS NEEDED   temazepam 30 MG capsule Commonly known as:  RESTORIL TAKE ONE TO TWO CAPSULES BY MOUTH NIGHTLY AT BEDTIME   tiZANidine 4 MG tablet Commonly known as:  ZANAFLEX Take 2 tablets (8 mg total) by mouth every 6 (six) hours as needed for muscle spasms.   Vitamin D3 10000 units capsule Take 10,000 Units by mouth daily.   zolpidem 10 MG tablet Commonly known as:  AMBIEN Take 1 tablet (10 mg total) by mouth at bedtime.       Allergies:  Allergies  Allergen Reactions  . Albuterol Shortness Of Breath and Other (See Comments)    Makes pt feel  jittery/ tacycardic  . Crestor [Rosuvastatin] Other (See Comments)    Joint pain, muscle pain, and hair loss  . Halcion [Triazolam] Other (See Comments)    Dizziness,headaches,bladder problems  . Levaquin [Levofloxacin In D5w] Diarrhea and Itching    Shoulder pain  . Naproxen Sodium Swelling    Patient tolerates in small doses  . Tylenol [Acetaminophen] Swelling    Patient tolerates in small doses  . Cefaclor Other (See Comments)    Doesn't remember---unsure if actually allergic   . Diclofenac Sodium Other (See Comments)    "made very sick"  . Sulfa Antibiotics Itching    Unsure of reaction possibly itching  . Tramadol Itching and Nausea And Vomiting  . Aripiprazole Other (See Comments)    Muscle tension/cramping  . Ibuprofen Swelling    Patient tolerates in small doses    Family History: Family History  Problem Relation Age of Onset  . Cancer Father        Abdomen with mastasis  . Cancer Mother   . Heart disease Mother   . Kidney disease Neg Hx   . Bladder Cancer Neg Hx   . Prostate cancer Neg Hx   . Kidney cancer Neg Hx     Social History:  reports that she quit smoking about 4 years ago. Her smoking use included Cigarettes. She quit after 25.00 years of use. She has never used smokeless tobacco. She reports that she uses drugs, including Methylphenidate. She reports that she does not drink alcohol.  ROS: UROLOGY Frequent Urination?: Yes Hard to postpone urination?: Yes Burning/pain with urination?: Yes Get up at night to urinate?: Yes Leakage of urine?: Yes Urine stream starts and stops?: No Trouble starting stream?: Yes Do you have to strain to urinate?: Yes Blood in urine?: No Urinary tract infection?: No Sexually transmitted disease?: No Injury to kidneys or bladder?: No Painful intercourse?: No Weak stream?: No Currently pregnant?: No Vaginal bleeding?: No Last menstrual period?: n  Gastrointestinal Nausea?: No Vomiting?: No Indigestion/heartburn?:  Yes Diarrhea?: No Constipation?: No  Constitutional Fever: No Night sweats?: No Weight loss?: No Fatigue?: Yes  Skin Skin rash/lesions?: Yes Itching?:  No  Eyes Blurred vision?: No Double vision?: No  Ears/Nose/Throat Sore throat?: No Sinus problems?: No  Hematologic/Lymphatic Swollen glands?: No Easy bruising?: No  Cardiovascular Leg swelling?: No Chest pain?: No  Respiratory Cough?: No Shortness of breath?: No  Endocrine Excessive thirst?: No  Musculoskeletal Back pain?: Yes Joint pain?: Yes  Neurological Headaches?: No Dizziness?: Yes  Psychologic Depression?: Yes Anxiety?: Yes   Physical Exam: BP 103/68 (BP Location: Left Arm, Patient Position: Sitting, Cuff Size: Normal)   Pulse 86   Ht 5\' 9"  (1.753 m)   Wt 209 lb (94.8 kg)   LMP 08/23/2014   BMI 30.86 kg/m   Constitutional: Well nourished. Alert and oriented, No acute distress. HEENT: [place] AT, moist mucus membranes. Trachea midline, no masses. Cardiovascular: No clubbing, cyanosis, or edema. Respiratory: Normal respiratory effort, no increased work of breathing. Skin: No rashes, bruises or suspicious lesions. Lymph: No cervical or inguinal adenopathy. Neurologic: Grossly intact, no focal deficits, moving all 4 extremities. Psychiatric: Normal mood and affect.  Laboratory Data: Lab Results  Component Value Date   WBC 7.0 07/27/2017   HGB 13.6 07/27/2017   HCT 40.8 07/27/2017   MCV 96.4 07/27/2017   PLT 326.0 07/27/2017    Lab Results  Component Value Date   CREATININE 0.90 07/27/2017    Lab Results  Component Value Date   HGBA1C 6.1 03/18/2017    Lab Results  Component Value Date   TSH 1.14 03/18/2017       Component Value Date/Time   CHOL 316 (H) 03/18/2017 1018   HDL 50.60 03/18/2017 1018   CHOLHDL 6 03/18/2017 1018   VLDL 53.8 (H) 03/18/2017 1018   LDLCALC 223 (H) 11/13/2014 0908    Lab Results  Component Value Date   AST 15 10/10/2016   Lab Results   Component Value Date   ALT 11 (L) 10/10/2016   I have reviewed the labs   PTNS treatment: The needle electrode was inserted into the lower, inner aspect of the patient's left leg. The surface electrode was placed on the inside arch of the foot on the treatment leg. The lead set was connected to the stimulator and the needle electrode clip was connected to the needle electrode. The stimulator that produces an adjustable electrical pulse that travels to the sacral nerve plexus via the tibial nerve was increased to 4 until the patient received a toe flex and a sensory response.     Assessment & Plan:    1. Frequency  Treatment Plan:  The needle electrode was removed without difficulty to the patient.  Patient tolerated the procedure for 30 minutes.  She will return next week for # 3 out of 12 of their weekly PTNS treatment's  Patient left after treatment and I did not have a chance to speak with her regarding her urinary symptoms.      Return in about 1 week (around 08/18/2017) for # 3 PTNS.  These notes generated with voice recognition software. I apologize for typographical errors.  Zara Council, [facility] [address] [place], [place] [address] [phone]

## 2017-08-11 ENCOUNTER — Ambulatory Visit: Payer: PPO | Admitting: Urology

## 2017-08-11 ENCOUNTER — Encounter: Payer: Self-pay | Admitting: Urology

## 2017-08-11 ENCOUNTER — Telehealth: Payer: Self-pay | Admitting: Primary Care

## 2017-08-11 VITALS — BP 103/68 | HR 86 | Ht 69.0 in | Wt 209.0 lb

## 2017-08-11 DIAGNOSIS — R350 Frequency of micturition: Secondary | ICD-10-CM | POA: Diagnosis not present

## 2017-08-11 NOTE — Telephone Encounter (Signed)
I know her well - I can see her tomorrow. Anywhere you think looks fine

## 2017-08-11 NOTE — Progress Notes (Signed)
PTNS  Session # 2  Health & Social Factors: no change Caffeine: 1 Alcohol: 0 Daytime voids #per day: 18 Night-time voids #per night: 1 Urgency: strong Incontinence Episodes #per day: 0 Ankle used: left Treatment Setting: 4 Feeling/ Response: both Comments: Patient tolerated well.  Preformed By: Fonnie Jarvis, CMA  Assistant: Elberta Leatherwood, CMA  Follow Up: 1 week

## 2017-08-11 NOTE — Telephone Encounter (Signed)
Caller Name:Jacinta Boutin Relationship to Patient:self Best number:[phone] Pharmacy:  Reason for call: pt has seen Dr Lorelei Pont for ortho issues before. Today, she fell in the shower and has injured her hip, leg, ankle, and feet.   Pt is asking to be worked in to Thursday's schedule.  Pt request cb

## 2017-08-16 NOTE — Progress Notes (Signed)
Chief Complaint:  Chief Complaint  Patient presents with  . PTNS    Urinary frequency     HPI: Patient is a 56 year old Caucasian female with multiple sclerosis with a history of nephrolithiasis and urinary frequency who presents today to begin 12 weekly treatments of PTNS.  This will be # 3/12.           Anticholinergics - patient failed Vesicare     Myrbetriq was effective, but she found it cost prohibitive      Contraindications present for PTNS      Pacemaker - NO      Implantable defibrillator - NO      History of abnormal bleeding - NO      History of neuropathies or nerve damage - NO  Discussed with patient possible complications of procedure, such as discomfort, bleeding at insertion/stimulation site, procedure consent signed  Patient goals:      Patient is wanting to have the same control over her urinary symptoms as she experienced with the Myrbetriq.    Reemphasized that most patient's will see benefit by the 8th week of treatment, but about 10 to 20% of patient may be late responders.  If they happen to be late responders, it is important to continue with the therapy beyond the 12 weekly treatments as many of those patient find benefit.  Today, she is experiencing frequency, urgency, dysuria, nocturia, incontinence, so draining to urinate and hesitancy.  She is experiencing 21 daytime voids (worse), 1 night time voids (stable), a strong urgency (stable) and 0 leakage (stable).   She is not having dysuria, gross hematuria suprapubic pain. She denies fever, chills, nausea and vomiting.   PMH: Past Medical History:  Diagnosis Date  . Arthritis    osteo  . Asthma   . Bipolar disorder ([place]) 05/21/14  . Cataracts, bilateral   . DDD (degenerative disc disease), cervical    also back  . Depression   . Dizziness    Positional  . Edema    feet/legs  . Fibromyalgia syndrome   . Fungal infection    Finger nails  . GERD (gastroesophageal reflux disease)   .  Gout   . Headache    seasonal allergies  . Heart palpitations   . 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 Hypercholesterolemia   . Multiple sclerosis (HCC)    weakness  . Nephrolithiasis    kidney stones  . Osteoporosis    osteoarthritis  . Pneumonia   . PONV (postoperative nausea and vomiting)    no problem after cataract surgery  . Psoriasis   . Renal stone   . Shortness of breath dyspnea    wheezing  . Sleep apnea 2012   sleep study / slight, no interventions  . Urinary frequency     Surgical History: Past Surgical History:  Procedure Laterality Date  . CATARACT EXTRACTION W/PHACO Left 05/21/2015   Procedure: CATARACT EXTRACTION PHACO AND INTRAOCULAR LENS PLACEMENT (IOC);  Surgeon: Birder Robson, MD;  Location: [facility];  Service: Ophthalmology;  Laterality: Left;  [place] 00:35 AP% 22.9 CDE 8.11 fluid pack lot #[id number]  . CATARACT EXTRACTION W/PHACO Right 06/04/2015   Procedure: CATARACT EXTRACTION PHACO AND INTRAOCULAR LENS PLACEMENT (IOC);  Surgeon: Birder Robson, MD;  Location: [facility];  Service: Ophthalmology;  Laterality: Right;  US:00:48 AP%: 10.5 CDE:5.08 Fluid lot #[id number]  . CYSTOSCOPY/URETEROSCOPY/HOLMIUM LASER/STENT PLACEMENT Bilateral 09/22/2016   Procedure: CYSTOSCOPY/URETEROSCOPY/HOLMIUM LASER/STENT PLACEMENT;  Surgeon: Hollice Espy, MD;  Location: [facility];  Service:  Urology;  Laterality: Bilateral;  . EYE SURGERY  2015   tissue biopsy  . FOOT SURGERY  2015  . JOINT REPLACEMENT Left 2013   hip replacement  . LITHOTRIPSY    . PTOSIS REPAIR Bilateral 02/18/2016   Procedure: BILATERAL PTOSIS REPAIR UPPER EYELIDS;  Surgeon: Karle Starch, MD;  Location: [facility];  Service: Ophthalmology;  Laterality: Bilateral;  LEAVE PT EARLY AM  . thumb surgery Right   . TONSILLECTOMY  1973    Home Medications:  Allergies as of 08/18/2017      Reactions   Albuterol Shortness Of Breath, Other (See Comments)   Makes pt feel jittery/ tacycardic   Crestor  [rosuvastatin] Other (See Comments)   Joint pain, muscle pain, and hair loss   Halcion [triazolam] Other (See Comments)   Dizziness,headaches,bladder problems   Levaquin [levofloxacin In D5w] Diarrhea, Itching   Shoulder pain   Naproxen Sodium Swelling   Patient tolerates in small doses   Tylenol [acetaminophen] Swelling   Patient tolerates in small doses   Cefaclor Other (See Comments)   Doesn't remember---unsure if actually allergic    Diclofenac Sodium Other (See Comments)   "made very sick"   Sulfa Antibiotics Itching   Unsure of reaction possibly itching   Tramadol Itching, Nausea And Vomiting   Aripiprazole Other (See Comments)   Muscle tension/cramping   Ibuprofen Swelling   Patient tolerates in small doses      Medication List       Accurate as of 08/18/17 11:59 PM. Always use your most recent med list.          ASPERCREME LIDOCAINE EX Apply 1 application topically 4 (four) times daily as needed (for pain.).   aspirin 325 MG tablet Take 325 mg by mouth every 4 (four) hours as needed for headache.   AZO-TABS 95 MG tablet Generic drug:  phenazopyridine Take 95 mg by mouth 3 (three) times daily as needed for pain.   Biotin 10000 MCG Tabs Take 1 tablet by mouth daily.   calcipotriene-betamethasone ointment Commonly known as:  TACLONEX Apply 1 application topically daily as needed (for psorasis).   clobetasol cream 0.05 % Commonly known as:  TEMOVATE Apply 1 application topically 2 (two) times daily.   cyanocobalamin 1000 MCG/ML injection Commonly known as:  (VITAMIN B-12) Inject 1,000 mcg into the muscle every 30 (thirty) days. For 3 Months (June, July, August)   diclofenac sodium 1 % Gel Commonly known as:  VOLTAREN Apply 4 g topically 4 (four) times daily.   docusate sodium 100 MG capsule Commonly known as:  COLACE Take 1 capsule (100 mg total) by mouth 2 (two) times daily.   DULoxetine 20 MG capsule Commonly known as:  CYMBALTA Take 2 capsules  (40 mg total) by mouth at bedtime.   HYDROcodone-acetaminophen 10-325 MG tablet Commonly known as:  NORCO Take 1 tablet by mouth every 6 (six) hours as needed for moderate pain or severe pain.   hydroquinone 4 % cream 1 APPLICATION TOPICALLY DAILY AS NEEDED FOR BLEMISHES.   hydrOXYzine 50 MG capsule Commonly known as:  VISTARIL Take 2 capsules (100 mg total) by mouth at bedtime.   ketorolac 10 MG tablet Commonly known as:  TORADOL Take 1 tablet (10 mg total) by mouth 2 (two) times daily as needed for severe pain.   lamoTRIgine 150 MG tablet Commonly known as:  LAMICTAL Take 2 tablets (300 mg total) by mouth at bedtime.   levalbuterol 45 MCG/ACT inhaler Commonly known as:  XOPENEX HFA Inhale 1-2 puffs into the lungs every 6 (six) hours as needed for wheezing.   Lysine 1000 MG Tabs Take 2,000-4,000 mg by mouth at bedtime. 2000 mg scheduled at bedtime and patient will take 4000 mg if she has outbreak   mirabegron ER 50 MG Tb24 tablet Commonly known as:  MYRBETRIQ Take 1 tablet (50 mg total) by mouth daily.   neomycin-bacitracin-polymyxin [phone] ointment Apply 1 application topically 4 (four) times daily as needed (for cut/scrapes.).   nicotine 21 mg/24hr patch Commonly known as:  NICODERM CQ - dosed in mg/24 hours Place 1 patch (21 mg total) onto the skin daily.   polyethylene glycol powder powder Commonly known as:  GLYCOLAX/MIRALAX MIX 17 GRAMS (1 CAPFUL) WITH 4-8 OZ OF LIQUID AND TAKE BY MOUTH TWICE DAILY AS NEEDED   temazepam 30 MG capsule Commonly known as:  RESTORIL TAKE ONE TO TWO CAPSULES BY MOUTH NIGHTLY AT BEDTIME   tiZANidine 4 MG tablet Commonly known as:  ZANAFLEX Take 2 tablets (8 mg total) by mouth every 6 (six) hours as needed for muscle spasms.   Vitamin D3 10000 units capsule Take 10,000 Units by mouth daily.   zolpidem 10 MG tablet Commonly known as:  AMBIEN Take 1 tablet (10 mg total) by mouth at bedtime.       Allergies:  Allergies    Allergen Reactions  . Albuterol Shortness Of Breath and Other (See Comments)    Makes pt feel jittery/ tacycardic  . Crestor [Rosuvastatin] Other (See Comments)    Joint pain, muscle pain, and hair loss  . Halcion [Triazolam] Other (See Comments)    Dizziness,headaches,bladder problems  . Levaquin [Levofloxacin In D5w] Diarrhea and Itching    Shoulder pain  . Naproxen Sodium Swelling    Patient tolerates in small doses  . Tylenol [Acetaminophen] Swelling    Patient tolerates in small doses  . Cefaclor Other (See Comments)    Doesn't remember---unsure if actually allergic   . Diclofenac Sodium Other (See Comments)    "made very sick"  . Sulfa Antibiotics Itching    Unsure of reaction possibly itching  . Tramadol Itching and Nausea And Vomiting  . Aripiprazole Other (See Comments)    Muscle tension/cramping  . Ibuprofen Swelling    Patient tolerates in small doses    Family History: Family History  Problem Relation Age of Onset  . Cancer Father        Abdomen with mastasis  . Cancer Mother   . Heart disease Mother   . Kidney disease Neg Hx   . Bladder Cancer Neg Hx   . Prostate cancer Neg Hx   . Kidney cancer Neg Hx     Social History:  reports that she quit smoking about 4 years ago. Her smoking use included Cigarettes. She quit after 25.00 years of use. She has never used smokeless tobacco. She reports that she does not drink alcohol or use drugs.  ROS: UROLOGY Frequent Urination?: Yes Hard to postpone urination?: Yes Burning/pain with urination?: No Get up at night to urinate?: Yes Leakage of urine?: Yes Urine stream starts and stops?: Yes Trouble starting stream?: Yes Do you have to strain to urinate?: Yes Blood in urine?: No Urinary tract infection?: No Sexually transmitted disease?: No Injury to kidneys or bladder?: No Painful intercourse?: No Weak stream?: No Currently pregnant?: No Vaginal bleeding?: No Last menstrual period?:  n  Gastrointestinal Nausea?: No Vomiting?: No Indigestion/heartburn?: No Diarrhea?: No Constipation?: No  Constitutional Fever: No  Night sweats?: Yes Weight loss?: No Fatigue?: No  Skin Skin rash/lesions?: No Itching?: Yes  Eyes Blurred vision?: No Double vision?: No  Ears/Nose/Throat Sore throat?: No Sinus problems?: No  Hematologic/Lymphatic Swollen glands?: No Easy bruising?: No  Cardiovascular Leg swelling?: No Chest pain?: No  Respiratory Cough?: No Shortness of breath?: No  Endocrine Excessive thirst?: No  Musculoskeletal Back pain?: Yes Joint pain?: Yes  Neurological Headaches?: No Dizziness?: No  Psychologic Depression?: Yes Anxiety?: Yes   Physical Exam: BP 126/78   Pulse 94   Ht 5\' 9"  (1.753 m)   Wt 211 lb 14.4 oz (96.1 kg)   LMP 08/23/2014   BMI 31.29 kg/m   Constitutional: Well nourished. Alert and oriented, No acute distress. HEENT: [place] AT, moist mucus membranes. Trachea midline, no masses. Cardiovascular: No clubbing, cyanosis, or edema. Respiratory: Normal respiratory effort, no increased work of breathing. Skin: No rashes, bruises or suspicious lesions. Lymph: No cervical or inguinal adenopathy. Neurologic: Grossly intact, no focal deficits, moving all 4 extremities. Psychiatric: Normal mood and affect.  Laboratory Data: Lab Results  Component Value Date   WBC 7.0 07/27/2017   HGB 13.6 07/27/2017   HCT 40.8 07/27/2017   MCV 96.4 07/27/2017   PLT 326.0 07/27/2017    Lab Results  Component Value Date   CREATININE 0.90 07/27/2017    Lab Results  Component Value Date   HGBA1C 6.1 03/18/2017    Lab Results  Component Value Date   TSH 1.14 03/18/2017       Component Value Date/Time   CHOL 316 (H) 03/18/2017 1018   HDL 50.60 03/18/2017 1018   CHOLHDL 6 03/18/2017 1018   VLDL 53.8 (H) 03/18/2017 1018   LDLCALC 223 (H) 11/13/2014 0908    Lab Results  Component Value Date   AST 15 10/10/2016   Lab  Results  Component Value Date   ALT 11 (L) 10/10/2016   I have reviewed the labs   PTNS treatment: The needle electrode was inserted into the lower, inner aspect of the patient's left leg. The surface electrode was placed on the inside arch of the foot on the treatment leg. The lead set was connected to the stimulator and the needle electrode clip was connected to the needle electrode. The stimulator that produces an adjustable electrical pulse that travels to the sacral nerve plexus via the tibial nerve was increased to 1 until the patient received a toe flex and a sensory response.     Assessment & Plan:    1. Frequency  Treatment Plan:  The needle electrode was removed without difficulty to the patient.  Patient tolerated the procedure for 30 minutes.  She will return next week for # 4 out of 12 of their weekly PTNS treatment's  Return for # 4/PTNS.  These notes generated with voice recognition software. I apologize for typographical errors.  Zara Council, [facility] [address], [place], [place] [address] [phone]

## 2017-08-17 ENCOUNTER — Encounter: Payer: Self-pay | Admitting: Urology

## 2017-08-18 ENCOUNTER — Encounter: Payer: Self-pay | Admitting: Urology

## 2017-08-18 ENCOUNTER — Ambulatory Visit (INDEPENDENT_AMBULATORY_CARE_PROVIDER_SITE_OTHER): Payer: PPO | Admitting: Urology

## 2017-08-18 VITALS — BP 126/78 | HR 94 | Ht 69.0 in | Wt 211.9 lb

## 2017-08-18 DIAGNOSIS — R350 Frequency of micturition: Secondary | ICD-10-CM | POA: Diagnosis not present

## 2017-08-18 NOTE — Progress Notes (Signed)
PTNS  Session # 3  Health & Social Factors: No Change Caffeine: 0-1 Alcohol: 0 Daytime voids #per day: 21 Night-time voids #per night: 1 Urgency: Strong Incontinence Episodes #per day: 0 Ankle used: Left Treatment Setting: 1 Feeling/ Response: Both  Preformed By: Zara Council PA-C  Assistant: Lyndee Hensen CMA  Follow Up: One week

## 2017-08-19 ENCOUNTER — Other Ambulatory Visit (HOSPITAL_COMMUNITY): Payer: Self-pay | Admitting: Psychiatry

## 2017-08-24 NOTE — Progress Notes (Signed)
Chief Complaint:  Chief Complaint  Patient presents with  . PTNS     HPI: Patient is a 56 year old Caucasian female with multiple sclerosis with a history of nephrolithiasis and urinary frequency who presents today to begin 12 weekly treatments of PTNS.  This will be # 4/12.           Anticholinergics - patient failed Vesicare     Myrbetriq was effective, but she found it cost prohibitive      Contraindications present for PTNS      Pacemaker - NO      Implantable defibrillator - NO      History of abnormal bleeding - NO      History of neuropathies or nerve damage - NO  Discussed with patient possible complications of procedure, such as discomfort, bleeding at insertion/stimulation site, procedure consent signed  Patient goals:      Patient is wanting to have the same control over her urinary symptoms as she experienced with the Myrbetriq.    Reemphasized that most patient's will see benefit by the 8th week of treatment, but about 10 to 20% of patient may be late responders.  If they happen to be late responders, it is important to continue with the therapy beyond the 12 weekly treatments as many of those patient find benefit.  Today, she is experiencing frequency, urgency, nocturia, intermittency, hesitancy and straining to urinate.   She is experiencing 18 daytime voids (improved), 1 night time voids (stable), a strong urgency (stable) and 0 leakage (stable).   She is not having dysuria, gross hematuria suprapubic pain. She denies fever, chills, nausea and vomiting.   PMH: Past Medical History:  Diagnosis Date  . Arthritis    osteo  . Asthma   . Bipolar disorder ([place]) 05/21/14  . Cataracts, bilateral   . DDD (degenerative disc disease), cervical    also back  . Depression   . Dizziness    Positional  . Edema    feet/legs  . Fibromyalgia syndrome   . Fungal infection    Finger nails  . GERD (gastroesophageal reflux disease)   . Gout   . Headache    seasonal allergies  . Heart palpitations   . 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 Hypercholesterolemia   . Multiple sclerosis (HCC)    weakness  . Nephrolithiasis    kidney stones  . Osteoporosis    osteoarthritis  . Pneumonia   . PONV (postoperative nausea and vomiting)    no problem after cataract surgery  . Psoriasis   . Renal stone   . Shortness of breath dyspnea    wheezing  . Sleep apnea 2012   sleep study / slight, no interventions  . Urinary frequency     Surgical History: Past Surgical History:  Procedure Laterality Date  . CATARACT EXTRACTION W/PHACO Left 05/21/2015   Procedure: CATARACT EXTRACTION PHACO AND INTRAOCULAR LENS PLACEMENT (IOC);  Surgeon: Birder Robson, MD;  Location: [facility];  Service: Ophthalmology;  Laterality: Left;  [place] 00:35 AP% 22.9 CDE 8.11 fluid pack lot #[id number]  . CATARACT EXTRACTION W/PHACO Right 06/04/2015   Procedure: CATARACT EXTRACTION PHACO AND INTRAOCULAR LENS PLACEMENT (IOC);  Surgeon: Birder Robson, MD;  Location: [facility];  Service: Ophthalmology;  Laterality: Right;  US:00:48 AP%: 10.5 CDE:5.08 Fluid lot #[id number]  . CYSTOSCOPY/URETEROSCOPY/HOLMIUM LASER/STENT PLACEMENT Bilateral 09/22/2016   Procedure: CYSTOSCOPY/URETEROSCOPY/HOLMIUM LASER/STENT PLACEMENT;  Surgeon: Hollice Espy, MD;  Location: [facility];  Service: Urology;  Laterality: Bilateral;  . EYE  SURGERY  2015   tissue biopsy  . FOOT SURGERY  2015  . JOINT REPLACEMENT Left 2013   hip replacement  . LITHOTRIPSY    . PTOSIS REPAIR Bilateral 02/18/2016   Procedure: BILATERAL PTOSIS REPAIR UPPER EYELIDS;  Surgeon: Karle Starch, MD;  Location: [facility];  Service: Ophthalmology;  Laterality: Bilateral;  LEAVE PT EARLY AM  . thumb surgery Right   . TONSILLECTOMY  1973    Home Medications:  Allergies as of 08/25/2017      Reactions   Albuterol Shortness Of Breath, Other (See Comments)   Makes pt feel jittery/ tacycardic   Crestor [rosuvastatin] Other  (See Comments)   Joint pain, muscle pain, and hair loss   Halcion [triazolam] Other (See Comments)   Dizziness,headaches,bladder problems   Levaquin [levofloxacin In D5w] Diarrhea, Itching   Shoulder pain   Naproxen Sodium Swelling   Patient tolerates in small doses   Tylenol [acetaminophen] Swelling   Patient tolerates in small doses   Cefaclor Other (See Comments)   Doesn't remember---unsure if actually allergic    Diclofenac Sodium Other (See Comments)   "made very sick"   Sulfa Antibiotics Itching   Unsure of reaction possibly itching   Tramadol Itching, Nausea And Vomiting   Aripiprazole Other (See Comments)   Muscle tension/cramping   Ibuprofen Swelling   Patient tolerates in small doses      Medication List       Accurate as of 08/25/17 11:59 PM. Always use your most recent med list.          ASPERCREME LIDOCAINE EX Apply 1 application topically 4 (four) times daily as needed (for pain.).   aspirin 325 MG tablet Take 325 mg by mouth every 4 (four) hours as needed for headache.   AZO-TABS 95 MG tablet Generic drug:  phenazopyridine Take 95 mg by mouth 3 (three) times daily as needed for pain.   Biotin 10000 MCG Tabs Take 30,000 mcg by mouth daily.   calcipotriene-betamethasone ointment Commonly known as:  TACLONEX Apply 1 application topically daily as needed (for psorasis).   clobetasol cream 0.05 % Commonly known as:  TEMOVATE Apply 1 application topically 2 (two) times daily.   cyanocobalamin 1000 MCG/ML injection Commonly known as:  (VITAMIN B-12) Inject 1,000 mcg into the muscle every 30 (thirty) days. For 3 Months (June, July, August)   diclofenac sodium 1 % Gel Commonly known as:  VOLTAREN Apply 4 g topically 4 (four) times daily.   docusate sodium 100 MG capsule Commonly known as:  COLACE Take 1 capsule (100 mg total) by mouth 2 (two) times daily.   DULoxetine 20 MG capsule Commonly known as:  CYMBALTA Take 2 capsules (40 mg total) by  mouth at bedtime.   HYDROcodone-acetaminophen 10-325 MG tablet Commonly known as:  NORCO Take 1 tablet by mouth every 6 (six) hours as needed for moderate pain or severe pain.   hydroquinone 4 % cream 1 APPLICATION TOPICALLY DAILY AS NEEDED FOR BLEMISHES.   hydrOXYzine 50 MG capsule Commonly known as:  VISTARIL Take 2 capsules (100 mg total) by mouth at bedtime.   ketorolac 10 MG tablet Commonly known as:  TORADOL Take 1 tablet (10 mg total) by mouth 2 (two) times daily as needed for severe pain.   lamoTRIgine 150 MG tablet Commonly known as:  LAMICTAL Take 2 tablets (300 mg total) by mouth at bedtime.   levalbuterol 45 MCG/ACT inhaler Commonly known as:  XOPENEX HFA Inhale 1-2 puffs into  the lungs every 6 (six) hours as needed for wheezing.   Lysine 1000 MG Tabs Take 2,000-4,000 mg by mouth at bedtime. 2000 mg scheduled at bedtime and patient will take 4000 mg if she has outbreak   mirabegron ER 50 MG Tb24 tablet Commonly known as:  MYRBETRIQ Take 1 tablet (50 mg total) by mouth daily.   neomycin-bacitracin-polymyxin [phone] ointment Apply 1 application topically 4 (four) times daily as needed (for cut/scrapes.).   nicotine 21 mg/24hr patch Commonly known as:  NICODERM CQ - dosed in mg/24 hours Place 1 patch (21 mg total) onto the skin daily.   polyethylene glycol powder powder Commonly known as:  GLYCOLAX/MIRALAX MIX 17 GRAMS (1 CAPFUL) WITH 4-8 OZ OF LIQUID AND TAKE BY MOUTH TWICE DAILY AS NEEDED   temazepam 30 MG capsule Commonly known as:  RESTORIL TAKE ONE TO TWO CAPSULES BY MOUTH NIGHTLY AT BEDTIME   tiZANidine 4 MG tablet Commonly known as:  ZANAFLEX Take 2 tablets (8 mg total) by mouth every 6 (six) hours as needed for muscle spasms.   Vitamin D3 10000 units capsule Take 10,000 Units by mouth daily.   zolpidem 10 MG tablet Commonly known as:  AMBIEN Take 1 tablet (10 mg total) by mouth at bedtime.       Allergies:  Allergies  Allergen  Reactions  . Albuterol Shortness Of Breath and Other (See Comments)    Makes pt feel jittery/ tacycardic  . Crestor [Rosuvastatin] Other (See Comments)    Joint pain, muscle pain, and hair loss  . Halcion [Triazolam] Other (See Comments)    Dizziness,headaches,bladder problems  . Levaquin [Levofloxacin In D5w] Diarrhea and Itching    Shoulder pain  . Naproxen Sodium Swelling    Patient tolerates in small doses  . Tylenol [Acetaminophen] Swelling    Patient tolerates in small doses  . Cefaclor Other (See Comments)    Doesn't remember---unsure if actually allergic   . Diclofenac Sodium Other (See Comments)    "made very sick"  . Sulfa Antibiotics Itching    Unsure of reaction possibly itching  . Tramadol Itching and Nausea And Vomiting  . Aripiprazole Other (See Comments)    Muscle tension/cramping  . Ibuprofen Swelling    Patient tolerates in small doses    Family History: Family History  Problem Relation Age of Onset  . Cancer Father        Abdomen with mastasis  . Cancer Mother   . Heart disease Mother   . Kidney disease Neg Hx   . Bladder Cancer Neg Hx   . Prostate cancer Neg Hx   . Kidney cancer Neg Hx     Social History:  reports that she quit smoking about 4 years ago. Her smoking use included Cigarettes. She quit after 25.00 years of use. She has never used smokeless tobacco. She reports that she does not drink alcohol or use drugs.  ROS: UROLOGY Frequent Urination?: Yes Hard to postpone urination?: Yes Burning/pain with urination?: No Get up at night to urinate?: Yes Leakage of urine?: No Urine stream starts and stops?: Yes Trouble starting stream?: Yes Do you have to strain to urinate?: Yes Blood in urine?: No Urinary tract infection?: No Sexually transmitted disease?: No Injury to kidneys or bladder?: No Painful intercourse?: No Weak stream?: No Currently pregnant?: No Vaginal bleeding?: No  Gastrointestinal Nausea?: No Vomiting?:  No Indigestion/heartburn?: Yes Diarrhea?: No Constipation?: No  Constitutional Fever: No Night sweats?: Yes Weight loss?: No Fatigue?: Yes  Skin Skin  rash/lesions?: No Itching?: No  Eyes Blurred vision?: No Double vision?: No  Ears/Nose/Throat Sore throat?: No Sinus problems?: No  Hematologic/Lymphatic Swollen glands?: No Easy bruising?: No  Cardiovascular Leg swelling?: No Chest pain?: No  Respiratory Cough?: No Shortness of breath?: No  Endocrine Excessive thirst?: No  Musculoskeletal Back pain?: Yes Joint pain?: Yes  Neurological Headaches?: Yes Dizziness?: No  Psychologic Depression?: Yes Anxiety?: Yes   Physical Exam: BP 124/76   Pulse (!) 102   Ht 5\' 9"  (1.753 m)   Wt 209 lb (94.8 kg)   LMP 08/23/2014   BMI 30.86 kg/m   Constitutional: Well nourished. Alert and oriented, No acute distress. HEENT: [place] AT, moist mucus membranes. Trachea midline, no masses. Cardiovascular: No clubbing, cyanosis, or edema. Respiratory: Normal respiratory effort, no increased work of breathing. Skin: No rashes, bruises or suspicious lesions. Lymph: No cervical or inguinal adenopathy. Neurologic: Grossly intact, no focal deficits, moving all 4 extremities. Psychiatric: Normal mood and affect.  Laboratory Data: Lab Results  Component Value Date   WBC 7.0 07/27/2017   HGB 13.6 07/27/2017   HCT 40.8 07/27/2017   MCV 96.4 07/27/2017   PLT 326.0 07/27/2017    Lab Results  Component Value Date   CREATININE 0.90 07/27/2017    Lab Results  Component Value Date   HGBA1C 6.1 03/18/2017    Lab Results  Component Value Date   TSH 1.14 03/18/2017       Component Value Date/Time   CHOL 316 (H) 03/18/2017 1018   HDL 50.60 03/18/2017 1018   CHOLHDL 6 03/18/2017 1018   VLDL 53.8 (H) 03/18/2017 1018   LDLCALC 223 (H) 11/13/2014 0908    Lab Results  Component Value Date   AST 15 10/10/2016   Lab Results  Component Value Date   ALT 11 (L)  10/10/2016   I have reviewed the labs   PTNS treatment: The needle electrode was inserted into the lower, inner aspect of the patient's left leg. The surface electrode was placed on the inside arch of the foot on the treatment leg. The lead set was connected to the stimulator and the needle electrode clip was connected to the needle electrode. The stimulator that produces an adjustable electrical pulse that travels to the sacral nerve plexus via the tibial nerve was increased to 2 until the patient received a toe flex and a sensory response.     Assessment & Plan:    1. Frequency  Treatment Plan:  The needle electrode was removed without difficulty to the patient.  Patient tolerated the procedure for 30 minutes.  She will return next week for # 5 out of 12 of their weekly PTNS treatment's  Return in about 1 week (around 09/01/2017) for # 5 PTNS.  These notes generated with voice recognition software. I apologize for typographical errors.  Zara Council, [facility] [address], [place], [place] [address] [phone]

## 2017-08-25 ENCOUNTER — Encounter: Payer: Self-pay | Admitting: Urology

## 2017-08-25 ENCOUNTER — Ambulatory Visit (INDEPENDENT_AMBULATORY_CARE_PROVIDER_SITE_OTHER): Payer: PPO | Admitting: Urology

## 2017-08-25 VITALS — BP 124/76 | HR 102 | Ht 69.0 in | Wt 209.0 lb

## 2017-08-25 DIAGNOSIS — R350 Frequency of micturition: Secondary | ICD-10-CM | POA: Diagnosis not present

## 2017-08-25 NOTE — Progress Notes (Signed)
PTNS  Session # 4  Health & Social Factors: No Change Caffeine: 1 Alcohol: 0 Daytime voids #per day: 18 Night-time voids #per night: 1 Urgency: Strong Incontinence Episodes #per day: 0 Ankle used: Left Treatment Setting: 2 Feeling/ Response: Both  Preformed By: Zara Council PA-C   Follow Up: One week

## 2017-08-26 ENCOUNTER — Ambulatory Visit: Payer: Self-pay | Admitting: Primary Care

## 2017-08-27 ENCOUNTER — Encounter: Payer: Self-pay | Admitting: Primary Care

## 2017-08-27 ENCOUNTER — Ambulatory Visit (INDEPENDENT_AMBULATORY_CARE_PROVIDER_SITE_OTHER): Payer: PPO | Admitting: Primary Care

## 2017-08-27 DIAGNOSIS — R350 Frequency of micturition: Secondary | ICD-10-CM | POA: Diagnosis not present

## 2017-08-27 DIAGNOSIS — G894 Chronic pain syndrome: Secondary | ICD-10-CM

## 2017-08-27 DIAGNOSIS — E538 Deficiency of other specified B group vitamins: Secondary | ICD-10-CM | POA: Diagnosis not present

## 2017-08-27 DIAGNOSIS — M159 Polyosteoarthritis, unspecified: Secondary | ICD-10-CM | POA: Diagnosis not present

## 2017-08-27 MED ORDER — CYANOCOBALAMIN 1000 MCG/ML IJ SOLN
1000.0000 ug | Freq: Once | INTRAMUSCULAR | Status: AC
  Administered 2017-08-27: 1000 ug via INTRAMUSCULAR

## 2017-08-27 NOTE — Assessment & Plan Note (Signed)
Early for 3 month visit for narcotic review, she does not need refills until November. PMP aware website reviewed, no suspicious activity. UDS in August 2018 unremarkable.  Will provide refills in November when due.

## 2017-08-27 NOTE — Assessment & Plan Note (Signed)
Increased symptoms due to weather changes. Cautioned against frequent use of etodolac. Continue hydrocodone and tizanidine.

## 2017-08-27 NOTE — Progress Notes (Signed)
Subjective:    Patient ID: Emily Moon, female    DOB: 28-Sep-1961, 56 y.o.   MRN: [id number]  HPI  Emily Moon is a 56 year old female who presents today for follow up and B 12 injection. She is one month early for her narcotic refill as she got mixed up in appointment dates. She is not due for refills of hydrocodone until November.  1) Vitamin B 12 Deficiency: B12 in August of 322. She is undergoing injections in our office ever 2-3 months and is due for injection today. Her last injection was in August 2018.  2) Chronic Pain Syndrome: Chronic osteoarthritis and multiple sclerosis. Managed on hydrocodone-acetaminophen 10-325 mg every 6 hours for which she's taken for years. She's had no dose increase or change in the prescription strength. Also managed on Tizanidine for muscle spasms. She did take one etodolac from an older prescription several days ago for increased arthralgias.   She fell about three weeks ago while in the bathroom. Her foot slipped, fell backwards onto her buttocks. She did not hit her head on the toilet. She's overall doing better, but has experienced increased arthralgias.   3) Urinary Frequency/Urgency: Following with Urology and is currently undergoing PTNS treatments. She's completed 4/12 treatments without much improvement in symptoms. She's still urinating 20-30 times daily, urgency and night time voids are less.   Review of Systems  Respiratory: Negative for shortness of breath.   Cardiovascular: Negative for chest pain.  Genitourinary:       Chronic urgency and frequency, following with Urology  Musculoskeletal:       Chronic osteoarthritis   Neurological: Negative for headaches.       Past Medical History:  Diagnosis Date  . Arthritis    osteo  . Asthma   . Bipolar disorder ([place]) 05/21/14  . Cataracts, bilateral   . DDD (degenerative disc disease), cervical    also back  . Depression   . Dizziness    Positional  . Edema    feet/legs  .  Fibromyalgia syndrome   . Fungal infection    Finger nails  . GERD (gastroesophageal reflux disease)   . Gout   . Headache    seasonal allergies  . Heart palpitations   . 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 Hypercholesterolemia   . Multiple sclerosis (HCC)    weakness  . Nephrolithiasis    kidney stones  . Osteoporosis    osteoarthritis  . Pneumonia   . PONV (postoperative nausea and vomiting)    no problem after cataract surgery  . Psoriasis   . Renal stone   . Shortness of breath dyspnea    wheezing  . Sleep apnea 2012   sleep study / slight, no interventions  . Urinary frequency      Social History   Social History  . Marital status: Divorced    Spouse name: N/A  . Number of children: 1  . Years of education: N/A   Occupational History  . Customer Service Rep at [employer]  . Smoking status: Former Smoker    Years: 25.00    Types: Cigarettes    Quit date: 11/16/2012  . Smokeless tobacco: Never Used     Comment: occasional use  . Alcohol use No  . Drug use: No  . Sexual activity: No   Other Topics Concern  . Not on file   Social History Narrative  . No narrative on  file    Past Surgical History:  Procedure Laterality Date  . CATARACT EXTRACTION W/PHACO Left 05/21/2015   Procedure: CATARACT EXTRACTION PHACO AND INTRAOCULAR LENS PLACEMENT (IOC);  Surgeon: Birder Robson, MD;  Location: [facility];  Service: Ophthalmology;  Laterality: Left;  [place] 00:35 AP% 22.9 CDE 8.11 fluid pack lot #[id number]  . CATARACT EXTRACTION W/PHACO Right 06/04/2015   Procedure: CATARACT EXTRACTION PHACO AND INTRAOCULAR LENS PLACEMENT (IOC);  Surgeon: Birder Robson, MD;  Location: [facility];  Service: Ophthalmology;  Laterality: Right;  US:00:48 AP%: 10.5 CDE:5.08 Fluid lot #[id number] H  . CYSTOSCOPY/URETEROSCOPY/HOLMIUM LASER/STENT PLACEMENT Bilateral 09/22/2016   Procedure: CYSTOSCOPY/URETEROSCOPY/HOLMIUM LASER/STENT PLACEMENT;  Surgeon:  Hollice Espy, MD;  Location: [facility];  Service: Urology;  Laterality: Bilateral;  . EYE SURGERY  2015   tissue biopsy  . FOOT SURGERY  2015  . JOINT REPLACEMENT Left 2013   hip replacement  . LITHOTRIPSY    . PTOSIS REPAIR Bilateral 02/18/2016   Procedure: BILATERAL PTOSIS REPAIR UPPER EYELIDS;  Surgeon: Karle Starch, MD;  Location: [facility];  Service: Ophthalmology;  Laterality: Bilateral;  LEAVE PT EARLY AM  . thumb surgery Right   . TONSILLECTOMY  1973    Family History  Problem Relation Age of Onset  . Cancer Father        Abdomen with mastasis  . Cancer Mother   . Heart disease Mother   . Kidney disease Neg Hx   . Bladder Cancer Neg Hx   . Prostate cancer Neg Hx   . Kidney cancer Neg Hx     Allergies  Allergen Reactions  . Albuterol Shortness Of Breath and Other (See Comments)    Makes pt feel jittery/ tacycardic  . Crestor [Rosuvastatin] Other (See Comments)    Joint pain, muscle pain, and hair loss  . Halcion [Triazolam] Other (See Comments)    Dizziness,headaches,bladder problems  . Levaquin [Levofloxacin In D5w] Diarrhea and Itching    Shoulder pain  . Naproxen Sodium Swelling    Patient tolerates in small doses  . Tylenol [Acetaminophen] Swelling    Patient tolerates in small doses  . Cefaclor Other (See Comments)    Doesn't remember---unsure if actually allergic   . Diclofenac Sodium Other (See Comments)    "made very sick"  . Sulfa Antibiotics Itching    Unsure of reaction possibly itching  . Tramadol Itching and Nausea And Vomiting  . Aripiprazole Other (See Comments)    Muscle tension/cramping  . Ibuprofen Swelling    Patient tolerates in small doses    Current Outpatient Prescriptions on File Prior to Visit  Medication Sig Dispense Refill  . ASPERCREME LIDOCAINE EX Apply 1 application topically 4 (four) times daily as needed (for pain.).     Marland Kitchen aspirin 325 MG tablet Take 325 mg by mouth every 4 (four) hours as needed for headache.       . Biotin 10000 MCG TABS Take 30,000 mcg by mouth daily.     . calcipotriene-betamethasone (TACLONEX) ointment Apply 1 application topically daily as needed (for psorasis).     . Cholecalciferol (VITAMIN D3) 10000 units capsule Take 10,000 Units by mouth daily.    . clobetasol cream (TEMOVATE) 7.51 % Apply 1 application topically 2 (two) times daily. 30 g 2  . cyanocobalamin (,VITAMIN B-12,) 1000 MCG/ML injection Inject 1,000 mcg into the muscle every 30 (thirty) days. For 3 Months (June, July, August)    . diclofenac sodium (VOLTAREN) 1 % GEL Apply 4 g topically 4 (four) times  daily. (Patient taking differently: Apply 4 g topically 4 (four) times daily as needed (for pain.). ) 500 g 5  . docusate sodium (COLACE) 100 MG capsule Take 1 capsule (100 mg total) by mouth 2 (two) times daily. (Patient taking differently: Take 100 mg by mouth 2 (two) times daily as needed. ) 60 capsule 0  . DULoxetine (CYMBALTA) 20 MG capsule Take 2 capsules (40 mg total) by mouth at bedtime. 60 capsule 5  . HYDROcodone-acetaminophen (NORCO) 10-325 MG tablet Take 1 tablet by mouth every 6 (six) hours as needed for moderate pain or severe pain. 120 tablet 0  . hydroquinone 4 % cream 1 APPLICATION TOPICALLY DAILY AS NEEDED FOR BLEMISHES.  3  . hydrOXYzine (VISTARIL) 50 MG capsule Take 2 capsules (100 mg total) by mouth at bedtime. 90 capsule 5  . ketorolac (TORADOL) 10 MG tablet Take 1 tablet (10 mg total) by mouth 2 (two) times daily as needed for severe pain. 60 tablet 0  . lamoTRIgine (LAMICTAL) 150 MG tablet Take 2 tablets (300 mg total) by mouth at bedtime. 60 tablet 5  . levalbuterol (XOPENEX HFA) 45 MCG/ACT inhaler Inhale 1-2 puffs into the lungs every 6 (six) hours as needed for wheezing. 1 Inhaler 5  . Lysine 1000 MG TABS Take 2,000-4,000 mg by mouth at bedtime. 2000 mg scheduled at bedtime and patient will take 4000 mg if she has outbreak    . neomycin-bacitracin-polymyxin (NEOSPORIN) [phone] ointment Apply 1  application topically 4 (four) times daily as needed (for cut/scrapes.).    Marland Kitchen nicotine (NICODERM CQ - DOSED IN MG/24 HOURS) 21 mg/24hr patch Place 1 patch (21 mg total) onto the skin daily. 28 patch 3  . phenazopyridine (AZO-TABS) 95 MG tablet Take 95 mg by mouth 3 (three) times daily as needed for pain.    . polyethylene glycol powder (GLYCOLAX/MIRALAX) powder MIX 17 GRAMS (1 CAPFUL) WITH 4-8 OZ OF LIQUID AND TAKE BY MOUTH TWICE DAILY AS NEEDED (Patient taking differently: MIX 17 GRAMS (1 CAPFUL) WITH 4-8 OZ OF LIQUID AND TAKE BY MOUTH TWICE DAILY AS NEEDED FOR CONSTIPATION) 527 g 0  . temazepam (RESTORIL) 30 MG capsule TAKE ONE TO TWO CAPSULES BY MOUTH NIGHTLY AT BEDTIME 60 capsule 3  . tiZANidine (ZANAFLEX) 4 MG tablet Take 2 tablets (8 mg total) by mouth every 6 (six) hours as needed for muscle spasms. 720 tablet 3  . zolpidem (AMBIEN) 10 MG tablet Take 1 tablet (10 mg total) by mouth at bedtime. 135 tablet 0   No current facility-administered medications on file prior to visit.     BP 124/80   Pulse 94   Temp 98.6 F (37 C) (Oral)   Ht 5\' 9"  (1.753 m)   Wt 210 lb 6.4 oz (95.4 kg)   LMP 08/23/2014   SpO2 97%   BMI 31.07 kg/m    Objective:   Physical Exam  Constitutional: She appears well-nourished.  Neck: Neck supple.  Cardiovascular: Normal rate and regular rhythm.   Pulmonary/Chest: Effort normal and breath sounds normal.  Skin: Skin is warm and dry.  Minimal bruising to lower lumbar spine, healing.          Assessment & Plan:

## 2017-08-27 NOTE — Assessment & Plan Note (Signed)
Due for repeat injection today. Repeat labs during next visit.

## 2017-08-27 NOTE — Patient Instructions (Signed)
Please notify me when you're needing refills of the hydrocodone.   Follow up with Urology as scheduled.   Come back in December for your B 12 injection. You don't have to see me, just ask for [facility].  Follow up in 4 months for re-evaluation.   It was a pleasure to see you today!

## 2017-08-27 NOTE — Addendum Note (Signed)
Addended by: Jacqualin Combes on: 08/27/2017 11:01 AM   Modules accepted: Orders

## 2017-08-27 NOTE — Assessment & Plan Note (Signed)
Following with Urology for PTNS symptoms. Seems like there's been overall improvement.

## 2017-08-31 NOTE — Progress Notes (Signed)
Chief Complaint:  Chief Complaint  Patient presents with  . PTNS    urinary frequency     HPI: Patient is a 56 year old Caucasian female with multiple sclerosis with a history of nephrolithiasis and urinary frequency who presents today to begin 12 weekly treatments of PTNS.  This will be # 5/12.           Anticholinergics - patient failed Vesicare     Myrbetriq was effective, but she found it cost prohibitive      Contraindications present for PTNS      Pacemaker - NO      Implantable defibrillator - NO      History of abnormal bleeding - NO      History of neuropathies or nerve damage - NO  Discussed with patient possible complications of procedure, such as discomfort, bleeding at insertion/stimulation site, procedure consent signed  Patient goals:      Patient is wanting to have the same control over her urinary symptoms as she experienced with the Myrbetriq.    Reemphasized that most patient's will see benefit by the 8th week of treatment, but about 10 to 20% of patient may be late responders.  If they happen to be late responders, it is important to continue with the therapy beyond the 12 weekly treatments as many of those patient find benefit.  Today, she is experiencing frequency, urgency, nocturia, intermittency, hesitancy and straining to urinate.   She is experiencing 19 daytime voids (worse), 1 night time voids (stable), a strong urgency (stable) and 0 leakage (stable).   She is not having dysuria, gross hematuria suprapubic pain. She denies fever, chills, nausea and vomiting.   PMH: Past Medical History:  Diagnosis Date  . Arthritis    osteo  . Asthma   . Bipolar disorder ([place]) 05/21/14  . Cataracts, bilateral   . DDD (degenerative disc disease), cervical    also back  . Depression   . Dizziness    Positional  . Edema    feet/legs  . Fibromyalgia syndrome   . Fungal infection    Finger nails  . GERD (gastroesophageal reflux disease)   . Gout   .  Headache    seasonal allergies  . Heart palpitations   . 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 Hypercholesterolemia   . Multiple sclerosis (H[facility])    weakness  . Nephrolithiasis    kidney stones  . Osteoporosis    osteoarthritis  . Pneumonia   . PONV (postoperative nausea and vomiting)    no problem after cataract surgery  . Psoriasis   . Renal stone   . Shortness of breath dyspnea    wheezing  . Sleep apnea 2012   sleep study / slight, no interventions  . Urinary frequency     Surgical History: Past Surgical History:  Procedure Laterality Date  . CATARACT EXTRACTION W/PHACO Left 05/21/2015   Procedure: CATARACT EXTRACTION PHACO AND INTRAOCULAR LENS PLACEMENT (IOC);  Surgeon: Birder Robson, MD;  Location: [facility];  Service: Ophthalmology;  Laterality: Left;  [place] 00:35 AP% 22.9 CDE 8.11 fluid pack lot #[id number]  . CATARACT EXTRACTION W/PHACO Right 06/04/2015   Procedure: CATARACT EXTRACTION PHACO AND INTRAOCULAR LENS PLACEMENT (IOC);  Surgeon: Birder Robson, MD;  Location: [facility];  Service: Ophthalmology;  Laterality: Right;  US:00:48 AP%: 10.5 CDE:5.08 Fluid lot #[id number]  . CYSTOSCOPY/URETEROSCOPY/HOLMIUM LASER/STENT PLACEMENT Bilateral 09/22/2016   Procedure: CYSTOSCOPY/URETEROSCOPY/HOLMIUM LASER/STENT PLACEMENT;  Surgeon: Hollice Espy, MD;  Location: [facility];  Service: Urology;  Laterality: Bilateral;  . EYE SURGERY  2015   tissue biopsy  . FOOT SURGERY  2015  . JOINT REPLACEMENT Left 2013   hip replacement  . LITHOTRIPSY    . PTOSIS REPAIR Bilateral 02/18/2016   Procedure: BILATERAL PTOSIS REPAIR UPPER EYELIDS;  Surgeon: Karle Starch, MD;  Location: [facility];  Service: Ophthalmology;  Laterality: Bilateral;  LEAVE PT EARLY AM  . thumb surgery Right   . TONSILLECTOMY  1973    Home Medications:  Allergies as of 09/01/2017      Reactions   Albuterol Shortness Of Breath, Other (See Comments)   Makes pt feel jittery/ tacycardic   Crestor  [rosuvastatin] Other (See Comments)   Joint pain, muscle pain, and hair loss   Halcion [triazolam] Other (See Comments)   Dizziness,headaches,bladder problems   Levaquin [levofloxacin In D5w] Diarrhea, Itching   Shoulder pain   Naproxen Sodium Swelling   Patient tolerates in small doses   Tylenol [acetaminophen] Swelling   Patient tolerates in small doses   Cefaclor Other (See Comments)   Doesn't remember---unsure if actually allergic    Diclofenac Sodium Other (See Comments)   "made very sick"   Sulfa Antibiotics Itching   Unsure of reaction possibly itching   Tramadol Itching, Nausea And Vomiting   Aripiprazole Other (See Comments)   Muscle tension/cramping   Ibuprofen Swelling   Patient tolerates in small doses      Medication List       Accurate as of 09/01/17 11:59 PM. Always use your most recent med list.          ASPERCREME LIDOCAINE EX Apply 1 application topically 4 (four) times daily as needed (for pain.).   aspirin 325 MG tablet Take 325 mg by mouth every 4 (four) hours as needed for headache.   AZO-TABS 95 MG tablet Generic drug:  phenazopyridine Take 95 mg by mouth 3 (three) times daily as needed for pain.   Biotin 10000 MCG Tabs Take 30,000 mcg by mouth daily.   calcipotriene-betamethasone ointment Commonly known as:  TACLONEX Apply 1 application topically daily as needed (for psorasis).   clobetasol cream 0.05 % Commonly known as:  TEMOVATE Apply 1 application topically 2 (two) times daily.   cyanocobalamin 1000 MCG/ML injection Commonly known as:  (VITAMIN B-12) Inject 1,000 mcg into the muscle every 30 (thirty) days. For 3 Months (June, July, August)   diclofenac sodium 1 % Gel Commonly known as:  VOLTAREN Apply 4 g topically 4 (four) times daily.   docusate sodium 100 MG capsule Commonly known as:  COLACE Take 1 capsule (100 mg total) by mouth 2 (two) times daily.   DULoxetine 20 MG capsule Commonly known as:  CYMBALTA Take 2  capsules (40 mg total) by mouth at bedtime.   HYDROcodone-acetaminophen 10-325 MG tablet Commonly known as:  NORCO Take 1 tablet by mouth every 6 (six) hours as needed for moderate pain or severe pain.   hydroquinone 4 % cream 1 APPLICATION TOPICALLY DAILY AS NEEDED FOR BLEMISHES.   hydrOXYzine 50 MG capsule Commonly known as:  VISTARIL Take 2 capsules (100 mg total) by mouth at bedtime.   ketorolac 10 MG tablet Commonly known as:  TORADOL Take 1 tablet (10 mg total) by mouth 2 (two) times daily as needed for severe pain.   lamoTRIgine 150 MG tablet Commonly known as:  LAMICTAL Take 2 tablets (300 mg total) by mouth at bedtime.   levalbuterol 45 MCG/ACT inhaler Commonly known as:  Penne Lash  HFA Inhale 1-2 puffs into the lungs every 6 (six) hours as needed for wheezing.   Lysine 1000 MG Tabs Take 2,000-4,000 mg by mouth at bedtime. 2000 mg scheduled at bedtime and patient will take 4000 mg if she has outbreak   neomycin-bacitracin-polymyxin [id number] ointment Apply 1 application topically 4 (four) times daily as needed (for cut/scrapes.).   nicotine 21 mg/24hr patch Commonly known as:  NICODERM CQ - dosed in mg/24 hours Place 1 patch (21 mg total) onto the skin daily.   nitrofurantoin (macrocrystal-monohydrate) 100 MG capsule Commonly known as:  MACROBID Take by mouth.   polyethylene glycol powder powder Commonly known as:  GLYCOLAX/MIRALAX MIX 17 GRAMS (1 CAPFUL) WITH 4-8 OZ OF LIQUID AND TAKE BY MOUTH TWICE DAILY AS NEEDED   temazepam 30 MG capsule Commonly known as:  RESTORIL TAKE ONE TO TWO CAPSULES BY MOUTH NIGHTLY AT BEDTIME   tiZANidine 4 MG tablet Commonly known as:  ZANAFLEX Take 2 tablets (8 mg total) by mouth every 6 (six) hours as needed for muscle spasms.   Vitamin D3 10000 units capsule Take 10,000 Units by mouth daily.   zolpidem 10 MG tablet Commonly known as:  AMBIEN Take 1 tablet (10 mg total) by mouth at bedtime.       Allergies:    Allergies  Allergen Reactions  . Albuterol Shortness Of Breath and Other (See Comments)    Makes pt feel jittery/ tacycardic  . Crestor [Rosuvastatin] Other (See Comments)    Joint pain, muscle pain, and hair loss  . Halcion [Triazolam] Other (See Comments)    Dizziness,headaches,bladder problems  . Levaquin [Levofloxacin In D5w] Diarrhea and Itching    Shoulder pain  . Naproxen Sodium Swelling    Patient tolerates in small doses  . Tylenol [Acetaminophen] Swelling    Patient tolerates in small doses  . Cefaclor Other (See Comments)    Doesn't remember---unsure if actually allergic   . Diclofenac Sodium Other (See Comments)    "made very sick"  . Sulfa Antibiotics Itching    Unsure of reaction possibly itching  . Tramadol Itching and Nausea And Vomiting  . Aripiprazole Other (See Comments)    Muscle tension/cramping  . Ibuprofen Swelling    Patient tolerates in small doses    Family History: Family History  Problem Relation Age of Onset  . Cancer Father        Abdomen with mastasis  . Cancer Mother   . Heart disease Mother   . Kidney disease Neg Hx   . Bladder Cancer Neg Hx   . Prostate cancer Neg Hx   . Kidney cancer Neg Hx     Social History:  reports that she quit smoking about 4 years ago. Her smoking use included Cigarettes. She quit after 25.00 years of use. She has never used smokeless tobacco. She reports that she does not drink alcohol or use drugs.  ROS: UROLOGY Frequent Urination?: Yes Hard to postpone urination?: No Burning/pain with urination?: No Get up at night to urinate?: Yes Leakage of urine?: Yes Urine stream starts and stops?: No Trouble starting stream?: Yes Do you have to strain to urinate?: Yes Blood in urine?: No Urinary tract infection?: No Sexually transmitted disease?: No Injury to kidneys or bladder?: No Painful intercourse?: No Weak stream?: No Currently pregnant?: No Vaginal bleeding?: No Last menstrual period?:  n  Gastrointestinal Nausea?: No Vomiting?: No Indigestion/heartburn?: Yes Diarrhea?: No Constipation?: No  Constitutional Fever: No Night sweats?: No Weight loss?: No Fatigue?: Yes  Skin Skin rash/lesions?: No Itching?: No  Eyes Blurred vision?: No Double vision?: No  Ears/Nose/Throat Sore throat?: No Sinus problems?: No  Hematologic/Lymphatic Swollen glands?: No Easy bruising?: No  Cardiovascular Leg swelling?: No Chest pain?: No  Respiratory Cough?: No Shortness of breath?: No  Endocrine Excessive thirst?: No  Musculoskeletal Back pain?: Yes Joint pain?: Yes  Neurological Headaches?: No Dizziness?: No  Psychologic Depression?: Yes Anxiety?: Yes   Physical Exam: BP 124/82   Pulse 90   Ht 5\' 9"  (1.753 m)   Wt 211 lb 8 oz (95.9 kg)   LMP 08/23/2014   BMI 31.23 kg/m   Constitutional: Well nourished. Alert and oriented, No acute distress. HEENT: [place] AT, moist mucus membranes. Trachea midline, no masses. Cardiovascular: No clubbing, cyanosis, or edema. Respiratory: Normal respiratory effort, no increased work of breathing. Skin: No rashes, bruises or suspicious lesions. Lymph: No cervical or inguinal adenopathy. Neurologic: Grossly intact, no focal deficits, moving all 4 extremities. Psychiatric: Normal mood and affect.  Laboratory Data: Lab Results  Component Value Date   WBC 7.0 07/27/2017   HGB 13.6 07/27/2017   HCT 40.8 07/27/2017   MCV 96.4 07/27/2017   PLT 326.0 07/27/2017    Lab Results  Component Value Date   CREATININE 0.90 07/27/2017    Lab Results  Component Value Date   HGBA1C 6.1 03/18/2017    Lab Results  Component Value Date   TSH 1.14 03/18/2017       Component Value Date/Time   CHOL 316 (H) 03/18/2017 1018   HDL 50.60 03/18/2017 1018   CHOLHDL 6 03/18/2017 1018   VLDL 53.8 (H) 03/18/2017 1018   LDLCALC 223 (H) 11/13/2014 0908    Lab Results  Component Value Date   AST 15 10/10/2016   Lab  Results  Component Value Date   ALT 11 (L) 10/10/2016   I have reviewed the labs   PTNS treatment: The needle electrode was inserted into the lower, inner aspect of the patient's right leg. The surface electrode was placed on the inside arch of the foot on the treatment leg. The lead set was connected to the stimulator and the needle electrode clip was connected to the needle electrode. The stimulator that produces an adjustable electrical pulse that travels to the sacral nerve plexus via the tibial nerve was increased to 6 until the patient received a toe flex and a sensory response.     Assessment & Plan:    1. Frequency  Treatment Plan:  The needle electrode was removed without difficulty to the patient.  Patient tolerated the procedure for 30 minutes.  She will return next week for # 6 out of 12 of their weekly PTNS treatment's  Return in about 1 week (around 09/08/2017) for # 6 PTNS.  These notes generated with voice recognition software. I apologize for typographical errors.  Zara Council, [facility] [address], [place],  [address] [phone]

## 2017-09-01 ENCOUNTER — Ambulatory Visit (INDEPENDENT_AMBULATORY_CARE_PROVIDER_SITE_OTHER): Payer: PPO | Admitting: Urology

## 2017-09-01 ENCOUNTER — Encounter: Payer: Self-pay | Admitting: Urology

## 2017-09-01 VITALS — BP 124/82 | HR 90 | Ht 69.0 in | Wt 211.5 lb

## 2017-09-01 DIAGNOSIS — R350 Frequency of micturition: Secondary | ICD-10-CM | POA: Diagnosis not present

## 2017-09-01 NOTE — Progress Notes (Signed)
PTNS  Session # 5  Health & Social Factors: No Change Caffeine: 1 Alcohol: 0 Daytime voids #per day: 19 Night-time voids #per night: 1 Urgency: Strong Incontinence Episodes #per day: 0 Ankle used: Right Treatment Setting: 6 Feeling/ Response: 6  Preformed By: Zara Council PA-C  Follow Up: One week

## 2017-09-08 ENCOUNTER — Encounter: Payer: Self-pay | Admitting: Urology

## 2017-09-08 ENCOUNTER — Ambulatory Visit: Payer: PPO | Admitting: Urology

## 2017-09-08 VITALS — BP 128/77 | HR 98 | Ht 69.0 in | Wt 209.1 lb

## 2017-09-08 DIAGNOSIS — R350 Frequency of micturition: Secondary | ICD-10-CM | POA: Diagnosis not present

## 2017-09-08 NOTE — Progress Notes (Signed)
PTNS  Session # 6  Health & Social Factors: Change Caffeine: 1 Alcohol: 0 Daytime voids #per day: 14 Night-time voids #per night: 0-1 Urgency: Mild Incontinence Episodes #per day: 0 Ankle used: Right Treatment Setting: 8 Feeling/ Response: Both Comments:   Preformed By: Zara Council, PA  Assistant: Toniann Fail, LPN  Follow Up: 1 week

## 2017-09-13 ENCOUNTER — Encounter: Payer: Self-pay | Admitting: Primary Care

## 2017-09-13 ENCOUNTER — Telehealth: Payer: Self-pay | Admitting: Primary Care

## 2017-09-13 DIAGNOSIS — M1990 Unspecified osteoarthritis, unspecified site: Secondary | ICD-10-CM

## 2017-09-13 MED ORDER — HYDROCODONE-ACETAMINOPHEN 10-325 MG PO TABS: 1.0000 | ORAL_TABLET | Freq: Four times a day (QID) | ORAL | 0 refills | Status: DC | PRN

## 2017-09-13 NOTE — Addendum Note (Signed)
Addended by: Ria Bush on: 09/13/2017 05:45 PM   Modules accepted: Orders

## 2017-09-13 NOTE — Telephone Encounter (Addendum)
So according to the [place] controlled substance database patient last refilled the Norco in 08/22/2017.   In [name]'s notes patient is not due until November. Will forward this to [facility]

## 2017-09-13 NOTE — Telephone Encounter (Deleted)
Ok to refill in [place] absent? Electronically refill request for HYDROcodone-acetaminophen (NORCO) 10-325 MG tablet.  Last prescribed on 06/23/2017. Last seen on 08/27/2017.

## 2017-09-13 NOTE — Telephone Encounter (Signed)
Copied from [place] #[id number]. Topic: Inquiry >> Sep 13, 2017  3:02 PM Oliver Pila B wrote: Reason for CRM: PT says her PCP Dr. Carlis Abbott needed her to call in to her Rx HYDROcodone-acetaminophen ([place]) 10-325 MG tablet b/c she will be out of the office. Contact PT if anything is needed, PT said she usually gets three separate pages for the this Rx

## 2017-09-13 NOTE — Telephone Encounter (Signed)
Patient just called and asked to speak to [facility]. Please give her a call back at [phone]

## 2017-09-13 NOTE — Telephone Encounter (Signed)
This has been addressed in the MyChart message.

## 2017-09-13 NOTE — Telephone Encounter (Signed)
Sure thing- Rx printed and in my CMA box, routed to [place].

## 2017-09-13 NOTE — Telephone Encounter (Signed)
Garlon Hatchet, I'm so sorry about this, but do you mind refilling Ms. Feigel's Norco for a one month supply? She'll be out on 09/22/17. I've already checked PMP and there's no unusual activity. She's taking Norco scheduled for chronic osteoarthritis as she has moderate multiple sclerosis. Feel free to review my last note.

## 2017-09-14 NOTE — Progress Notes (Signed)
Chief Complaint:  Chief Complaint  Patient presents with  . PTNS    urinary frequency     HPI: Patient is a 56 year old Caucasian female with multiple sclerosis with a history of nephrolithiasis and urinary frequency who presents today to begin 12 weekly treatments of PTNS.  This will be # 6/12.           Anticholinergics - patient failed Vesicare     Myrbetriq was effective, but she found it cost prohibitive      Contraindications present for PTNS      Pacemaker - NO      Implantable defibrillator - NO      History of abnormal bleeding - NO      History of neuropathies or nerve damage - NO  Discussed with patient possible complications of procedure, such as discomfort, bleeding at insertion/stimulation site, procedure consent signed  Patient goals:      Patient is wanting to have the same control over her urinary symptoms as she experienced with the Myrbetriq.    Reemphasized that most patient's will see benefit by the 8th week of treatment, but about 10 to 20% of patient may be late responders.  If they happen to be late responders, it is important to continue with the therapy beyond the 12 weekly treatments as many of those patient find benefit.  Today, she is experiencing frequency, urgency, nocturia, intermittency, hesitancy and straining to urinate.   She is experiencing 14 daytime voids (Improved), 1 night time voids (stable), a mild urgency (improved) and 0 leakage (stable).   She is not having dysuria, gross hematuria suprapubic pain. She denies fever, chills, nausea and vomiting.   PMH: Past Medical History:  Diagnosis Date  . Arthritis    osteo  . Asthma   . Bipolar disorder ([place]) 05/21/14  . Cataracts, bilateral   . DDD (degenerative disc disease), cervical    also back  . Depression   . Dizziness    Positional  . Edema    feet/legs  . Fibromyalgia syndrome   . Fungal infection    Finger nails  . GERD (gastroesophageal reflux disease)   . Gout     . Headache    seasonal allergies  . Heart palpitations   . 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 Hypercholesterolemia   . Multiple sclerosis (HCC)    weakness  . Nephrolithiasis    kidney stones  . Osteoporosis    osteoarthritis  . Pneumonia   . PONV (postoperative nausea and vomiting)    no problem after cataract surgery  . Psoriasis   . Renal stone   . Shortness of breath dyspnea    wheezing  . Sleep apnea 2012   sleep study / slight, no interventions  . Urinary frequency     Surgical History: Past Surgical History:  Procedure Laterality Date  . CATARACT EXTRACTION W/PHACO Left 05/21/2015   Procedure: CATARACT EXTRACTION PHACO AND INTRAOCULAR LENS PLACEMENT (IOC);  Surgeon: Birder Robson, MD;  Location: [facility];  Service: Ophthalmology;  Laterality: Left;  [place] 00:35 AP% 22.9 CDE 8.11 fluid pack lot #[id number]  . CATARACT EXTRACTION W/PHACO Right 06/04/2015   Procedure: CATARACT EXTRACTION PHACO AND INTRAOCULAR LENS PLACEMENT (IOC);  Surgeon: Birder Robson, MD;  Location: [facility];  Service: Ophthalmology;  Laterality: Right;  US:00:48 AP%: 10.5 CDE:5.08 Fluid lot #[id number]  . CYSTOSCOPY/URETEROSCOPY/HOLMIUM LASER/STENT PLACEMENT Bilateral 09/22/2016   Procedure: CYSTOSCOPY/URETEROSCOPY/HOLMIUM LASER/STENT PLACEMENT;  Surgeon: Hollice Espy, MD;  Location: [facility];  Service:  Urology;  Laterality: Bilateral;  . EYE SURGERY  2015   tissue biopsy  . FOOT SURGERY  2015  . JOINT REPLACEMENT Left 2013   hip replacement  . LITHOTRIPSY    . PTOSIS REPAIR Bilateral 02/18/2016   Procedure: BILATERAL PTOSIS REPAIR UPPER EYELIDS;  Surgeon: Karle Starch, MD;  Location: [place];  Service: Ophthalmology;  Laterality: Bilateral;  LEAVE PT EARLY AM  . thumb surgery Right   . TONSILLECTOMY  1973    Home Medications:  Allergies as of 09/08/2017      Reactions   Albuterol Shortness Of Breath, Other (See Comments)   Makes pt feel jittery/ tacycardic   Crestor  [rosuvastatin] Other (See Comments)   Joint pain, muscle pain, and hair loss   Halcion [triazolam] Other (See Comments)   Dizziness,headaches,bladder problems   Levaquin [levofloxacin In D5w] Diarrhea, Itching   Shoulder pain   Naproxen Sodium Swelling   Patient tolerates in small doses   Tylenol [acetaminophen] Swelling   Patient tolerates in small doses   Cefaclor Other (See Comments)   Doesn't remember---unsure if actually allergic    Diclofenac Sodium Other (See Comments)   "made very sick"   Sulfa Antibiotics Itching   Unsure of reaction possibly itching   Tramadol Itching, Nausea And Vomiting   Aripiprazole Other (See Comments)   Muscle tension/cramping   Ibuprofen Swelling   Patient tolerates in small doses      Medication List       Accurate as of 09/08/17 11:59 PM. Always use your most recent med list.          ASPERCREME LIDOCAINE EX Apply 1 application topically 4 (four) times daily as needed (for pain.).   aspirin 325 MG tablet Take 325 mg by mouth every 4 (four) hours as needed for headache.   AZO-TABS 95 MG tablet Generic drug:  phenazopyridine Take 95 mg by mouth 3 (three) times daily as needed for pain.   Biotin 10000 MCG Tabs Take 30,000 mcg by mouth daily.   calcipotriene-betamethasone ointment Commonly known as:  TACLONEX Apply 1 application topically daily as needed (for psorasis).   clobetasol cream 0.05 % Commonly known as:  TEMOVATE Apply 1 application topically 2 (two) times daily.   cyanocobalamin 1000 MCG/ML injection Commonly known as:  (VITAMIN B-12) Inject 1,000 mcg into the muscle every 30 (thirty) days. For 3 Months (June, July, August)   diclofenac sodium 1 % Gel Commonly known as:  VOLTAREN Apply 4 g topically 4 (four) times daily.   docusate sodium 100 MG capsule Commonly known as:  COLACE Take 1 capsule (100 mg total) by mouth 2 (two) times daily.   DULoxetine 20 MG capsule Commonly known as:  CYMBALTA Take 2  capsules (40 mg total) by mouth at bedtime.   HYDROcodone-acetaminophen 10-325 MG tablet Commonly known as:  NORCO Take 1 tablet by mouth every 6 (six) hours as needed for moderate pain or severe pain.   hydroquinone 4 % cream 1 APPLICATION TOPICALLY DAILY AS NEEDED FOR BLEMISHES.   hydrOXYzine 50 MG capsule Commonly known as:  VISTARIL Take 2 capsules (100 mg total) by mouth at bedtime.   ketorolac 10 MG tablet Commonly known as:  TORADOL Take 1 tablet (10 mg total) by mouth 2 (two) times daily as needed for severe pain.   lamoTRIgine 150 MG tablet Commonly known as:  LAMICTAL Take 2 tablets (300 mg total) by mouth at bedtime.   levalbuterol 45 MCG/ACT inhaler Commonly known as:  XOPENEX HFA Inhale 1-2 puffs into the lungs every 6 (six) hours as needed for wheezing.   Lysine 1000 MG Tabs Take 2,000-4,000 mg by mouth at bedtime. 2000 mg scheduled at bedtime and patient will take 4000 mg if she has outbreak   neomycin-bacitracin-polymyxin [phone] ointment Apply 1 application topically 4 (four) times daily as needed (for cut/scrapes.).   nicotine 21 mg/24hr patch Commonly known as:  NICODERM CQ - dosed in mg/24 hours Place 1 patch (21 mg total) onto the skin daily.   nitrofurantoin (macrocrystal-monohydrate) 100 MG capsule Commonly known as:  MACROBID Take by mouth.   polyethylene glycol powder powder Commonly known as:  GLYCOLAX/MIRALAX MIX 17 GRAMS (1 CAPFUL) WITH 4-8 OZ OF LIQUID AND TAKE BY MOUTH TWICE DAILY AS NEEDED   temazepam 30 MG capsule Commonly known as:  RESTORIL TAKE ONE TO TWO CAPSULES BY MOUTH NIGHTLY AT BEDTIME   tiZANidine 4 MG tablet Commonly known as:  ZANAFLEX Take 2 tablets (8 mg total) by mouth every 6 (six) hours as needed for muscle spasms.   Vitamin D3 10000 units capsule Take 10,000 Units by mouth daily.   zolpidem 10 MG tablet Commonly known as:  AMBIEN Take 1 tablet (10 mg total) by mouth at bedtime.       Allergies:    Allergies  Allergen Reactions  . Albuterol Shortness Of Breath and Other (See Comments)    Makes pt feel jittery/ tacycardic  . Crestor [Rosuvastatin] Other (See Comments)    Joint pain, muscle pain, and hair loss  . Halcion [Triazolam] Other (See Comments)    Dizziness,headaches,bladder problems  . Levaquin [Levofloxacin In D5w] Diarrhea and Itching    Shoulder pain  . Naproxen Sodium Swelling    Patient tolerates in small doses  . Tylenol [Acetaminophen] Swelling    Patient tolerates in small doses  . Cefaclor Other (See Comments)    Doesn't remember---unsure if actually allergic   . Diclofenac Sodium Other (See Comments)    "made very sick"  . Sulfa Antibiotics Itching    Unsure of reaction possibly itching  . Tramadol Itching and Nausea And Vomiting  . Aripiprazole Other (See Comments)    Muscle tension/cramping  . Ibuprofen Swelling    Patient tolerates in small doses    Family History: Family History  Problem Relation Age of Onset  . Cancer Father        Abdomen with mastasis  . Cancer Mother   . Heart disease Mother   . Kidney disease Neg Hx   . Bladder Cancer Neg Hx   . Prostate cancer Neg Hx   . Kidney cancer Neg Hx     Social History:  reports that she quit smoking about 4 years ago. Her smoking use included Cigarettes. She quit after 25.00 years of use. She has never used smokeless tobacco. She reports that she does not drink alcohol or use drugs.  ROS: UROLOGY Frequent Urination?: Yes Hard to postpone urination?: No Burning/pain with urination?: No Get up at night to urinate?: Yes Leakage of urine?: Yes Urine stream starts and stops?: No Trouble starting stream?: Yes Do you have to strain to urinate?: Yes Blood in urine?: No Urinary tract infection?: No Sexually transmitted disease?: No Injury to kidneys or bladder?: No Painful intercourse?: No Weak stream?: No Currently pregnant?: No Vaginal bleeding?: No Last menstrual period?:  n  Gastrointestinal Nausea?: No Vomiting?: No Indigestion/heartburn?: Yes Diarrhea?: No Constipation?: No  Constitutional Fever: No Night sweats?: Yes Weight loss?: No Fatigue?:  Yes  Skin Skin rash/lesions?: No Itching?: No  Eyes Blurred vision?: No Double vision?: No  Ears/Nose/Throat Sore throat?: No Sinus problems?: No  Hematologic/Lymphatic Swollen glands?: No Easy bruising?: No  Cardiovascular Leg swelling?: No Chest pain?: No  Respiratory Cough?: No Shortness of breath?: No  Endocrine Excessive thirst?: No  Musculoskeletal Back pain?: Yes Joint pain?: Yes  Neurological Headaches?: No Dizziness?: No  Psychologic Depression?: Yes Anxiety?: Yes   Physical Exam: BP 128/77   Pulse 98   Ht 5\' 9"  (1.753 m)   Wt 209 lb 1.6 oz (94.8 kg)   LMP 08/23/2014   BMI 30.88 kg/m   Constitutional: Well nourished. Alert and oriented, No acute distress. HEENT: [place] AT, moist mucus membranes. Trachea midline, no masses. Cardiovascular: No clubbing, cyanosis, or edema. Respiratory: Normal respiratory effort, no increased work of breathing. Skin: No rashes, bruises or suspicious lesions. Lymph: No cervical or inguinal adenopathy. Neurologic: Grossly intact, no focal deficits, moving all 4 extremities. Psychiatric: Normal mood and affect.  Laboratory Data: Lab Results  Component Value Date   WBC 7.0 07/27/2017   HGB 13.6 07/27/2017   HCT 40.8 07/27/2017   MCV 96.4 07/27/2017   PLT 326.0 07/27/2017    Lab Results  Component Value Date   CREATININE 0.90 07/27/2017    Lab Results  Component Value Date   HGBA1C 6.1 03/18/2017    Lab Results  Component Value Date   TSH 1.14 03/18/2017       Component Value Date/Time   CHOL 316 (H) 03/18/2017 1018   HDL 50.60 03/18/2017 1018   CHOLHDL 6 03/18/2017 1018   VLDL 53.8 (H) 03/18/2017 1018   LDLCALC 223 (H) 11/13/2014 0908    Lab Results  Component Value Date   AST 15 10/10/2016   Lab  Results  Component Value Date   ALT 11 (L) 10/10/2016   I have reviewed the labs   PTNS treatment: The needle electrode was inserted into the lower, inner aspect of the patient's right leg. The surface electrode was placed on the inside arch of the foot on the treatment leg. The lead set was connected to the stimulator and the needle electrode clip was connected to the needle electrode. The stimulator that produces an adjustable electrical pulse that travels to the sacral nerve plexus via the tibial nerve was increased to 8 until the patient received a toe flex and a sensory response.     Assessment & Plan:    1. Frequency  Treatment Plan:  The needle electrode was removed without difficulty to the patient.  Patient tolerated the procedure for 30 minutes.  She will return next week for # 7 out of 12 of their weekly PTNS treatment's  Return in about 1 week (around 09/15/2017) for # 7 PTNS.  These notes generated with voice recognition software. I apologize for typographical errors.  Zara Council, [facility] [address] [place], [place] [address] [phone]

## 2017-09-14 NOTE — Telephone Encounter (Signed)
Spoken to patient and she was notified that Rx is ready for pick up. Left in front office

## 2017-09-14 NOTE — Progress Notes (Signed)
Chief Complaint:  Chief Complaint  Patient presents with  . PTNS     HPI: Patient is a 56 year old Caucasian female with multiple sclerosis with a history of nephrolithiasis and urinary frequency who presents today to begin 12 weekly treatments of PTNS.  This will be # 7/12.           Anticholinergics - patient failed Vesicare     Myrbetriq was effective, but she found it cost prohibitive      Contraindications present for PTNS      Pacemaker - NO      Implantable defibrillator - NO      History of abnormal bleeding - NO      History of neuropathies or nerve damage - NO  Discussed with patient possible complications of procedure, such as discomfort, bleeding at insertion/stimulation site, procedure consent signed  Patient goals:      Patient is wanting to have the same control over her urinary symptoms as she experienced with the Myrbetriq.    Reemphasized that most patient's will see benefit by the 8th week of treatment, but about 10 to 20% of patient may be late responders.  If they happen to be late responders, it is important to continue with the therapy beyond the 12 weekly treatments as many of those patient find benefit.  Today, she is experiencing frequency, urgency, nocturia, intermittency, hesitancy and straining to urinate.   She is experiencing 16 daytime voids (worse), 1 night time voids (stable), a mild urgency (stable) and 0 leakage (stable).   She is not having dysuria, gross hematuria suprapubic pain. She denies fever, chills, nausea and vomiting.   PMH: Past Medical History:  Diagnosis Date  . Arthritis    osteo  . Asthma   . Bipolar disorder ([place]) 05/21/14  . Cataracts, bilateral   . DDD (degenerative disc disease), cervical    also back  . Depression   . Dizziness    Positional  . Edema    feet/legs  . Fibromyalgia syndrome   . Fungal infection    Finger nails  . GERD (gastroesophageal reflux disease)   . Gout   . Headache    seasonal  allergies  . Heart palpitations   . 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 Hypercholesterolemia   . Multiple sclerosis (HCC)    weakness  . Nephrolithiasis    kidney stones  . Osteoporosis    osteoarthritis  . Pneumonia   . PONV (postoperative nausea and vomiting)    no problem after cataract surgery  . Psoriasis   . Renal stone   . Shortness of breath dyspnea    wheezing  . Sleep apnea 2012   sleep study / slight, no interventions  . Urinary frequency     Surgical History: Past Surgical History:  Procedure Laterality Date  . CATARACT EXTRACTION W/PHACO Left 05/21/2015   Procedure: CATARACT EXTRACTION PHACO AND INTRAOCULAR LENS PLACEMENT (IOC);  Surgeon: Birder Robson, MD;  Location: [facility];  Service: Ophthalmology;  Laterality: Left;  [place] 00:35 AP% 22.9 CDE 8.11 fluid pack lot #[id number]  . CATARACT EXTRACTION W/PHACO Right 06/04/2015   Procedure: CATARACT EXTRACTION PHACO AND INTRAOCULAR LENS PLACEMENT (IOC);  Surgeon: Birder Robson, MD;  Location: [facility];  Service: Ophthalmology;  Laterality: Right;  US:00:48 AP%: 10.5 CDE:5.08 Fluid lot #[id number]  . CYSTOSCOPY/URETEROSCOPY/HOLMIUM LASER/STENT PLACEMENT Bilateral 09/22/2016   Procedure: CYSTOSCOPY/URETEROSCOPY/HOLMIUM LASER/STENT PLACEMENT;  Surgeon: Hollice Espy, MD;  Location: [facility];  Service: Urology;  Laterality: Bilateral;  .  EYE SURGERY  2015   tissue biopsy  . FOOT SURGERY  2015  . JOINT REPLACEMENT Left 2013   hip replacement  . LITHOTRIPSY    . PTOSIS REPAIR Bilateral 02/18/2016   Procedure: BILATERAL PTOSIS REPAIR UPPER EYELIDS;  Surgeon: Karle Starch, MD;  Location: [place];  Service: Ophthalmology;  Laterality: Bilateral;  LEAVE PT EARLY AM  . thumb surgery Right   . TONSILLECTOMY  1973    Home Medications:  Allergies as of 09/15/2017      Reactions   Albuterol Shortness Of Breath, Other (See Comments)   Makes pt feel jittery/ tacycardic   Crestor [rosuvastatin] Other (See  Comments)   Joint pain, muscle pain, and hair loss   Halcion [triazolam] Other (See Comments)   Dizziness,headaches,bladder problems   Levaquin [levofloxacin In D5w] Diarrhea, Itching   Shoulder pain   Naproxen Sodium Swelling   Patient tolerates in small doses   Tylenol [acetaminophen] Swelling   Patient tolerates in small doses   Cefaclor Other (See Comments)   Doesn't remember---unsure if actually allergic    Diclofenac Sodium Other (See Comments)   "made very sick"   Sulfa Antibiotics Itching   Unsure of reaction possibly itching   Tramadol Itching, Nausea And Vomiting   Aripiprazole Other (See Comments)   Muscle tension/cramping   Ibuprofen Swelling   Patient tolerates in small doses      Medication List       Accurate as of 09/15/17  3:15 PM. Always use your most recent med list.          ASPERCREME LIDOCAINE EX Apply 1 application topically 4 (four) times daily as needed (for pain.).   aspirin 325 MG tablet Take 325 mg by mouth every 4 (four) hours as needed for headache.   AZO-TABS 95 MG tablet Generic drug:  phenazopyridine Take 95 mg by mouth 3 (three) times daily as needed for pain.   Biotin 10000 MCG Tabs Take 30,000 mcg by mouth daily.   calcipotriene-betamethasone ointment Commonly known as:  TACLONEX Apply 1 application topically daily as needed (for psorasis).   clobetasol cream 0.05 % Commonly known as:  TEMOVATE Apply 1 application topically 2 (two) times daily.   cyanocobalamin 1000 MCG/ML injection Commonly known as:  (VITAMIN B-12) Inject 1,000 mcg into the muscle every 30 (thirty) days. For 3 Months (June, July, August)   diclofenac sodium 1 % Gel Commonly known as:  VOLTAREN Apply 4 g topically 4 (four) times daily.   docusate sodium 100 MG capsule Commonly known as:  COLACE Take 1 capsule (100 mg total) by mouth 2 (two) times daily.   DULoxetine 20 MG capsule Commonly known as:  CYMBALTA Take 2 capsules (40 mg total) by mouth  at bedtime.   HYDROcodone-acetaminophen 10-325 MG tablet Commonly known as:  NORCO Take 1 tablet by mouth every 6 (six) hours as needed for moderate pain or severe pain.   hydroquinone 4 % cream 1 APPLICATION TOPICALLY DAILY AS NEEDED FOR BLEMISHES.   hydrOXYzine 50 MG capsule Commonly known as:  VISTARIL Take 2 capsules (100 mg total) by mouth at bedtime.   ketorolac 10 MG tablet Commonly known as:  TORADOL Take 1 tablet (10 mg total) by mouth 2 (two) times daily as needed for severe pain.   lamoTRIgine 150 MG tablet Commonly known as:  LAMICTAL Take 2 tablets (300 mg total) by mouth at bedtime.   levalbuterol 45 MCG/ACT inhaler Commonly known as:  XOPENEX HFA Inhale 1-2  puffs into the lungs every 6 (six) hours as needed for wheezing.   Lysine 1000 MG Tabs Take 2,000-4,000 mg by mouth at bedtime. 2000 mg scheduled at bedtime and patient will take 4000 mg if she has outbreak   neomycin-bacitracin-polymyxin [phone] ointment Apply 1 application topically 4 (four) times daily as needed (for cut/scrapes.).   nicotine 21 mg/24hr patch Commonly known as:  NICODERM CQ - dosed in mg/24 hours Place 1 patch (21 mg total) onto the skin daily.   nitrofurantoin (macrocrystal-monohydrate) 100 MG capsule Commonly known as:  MACROBID Take by mouth.   polyethylene glycol powder powder Commonly known as:  GLYCOLAX/MIRALAX MIX 17 GRAMS (1 CAPFUL) WITH 4-8 OZ OF LIQUID AND TAKE BY MOUTH TWICE DAILY AS NEEDED   temazepam 30 MG capsule Commonly known as:  RESTORIL TAKE ONE TO TWO CAPSULES BY MOUTH NIGHTLY AT BEDTIME   tiZANidine 4 MG tablet Commonly known as:  ZANAFLEX Take 2 tablets (8 mg total) by mouth every 6 (six) hours as needed for muscle spasms.   Vitamin D3 10000 units capsule Take 10,000 Units by mouth daily.   zolpidem 10 MG tablet Commonly known as:  AMBIEN Take 1 tablet (10 mg total) by mouth at bedtime.       Allergies:  Allergies  Allergen Reactions  .  Albuterol Shortness Of Breath and Other (See Comments)    Makes pt feel jittery/ tacycardic  . Crestor [Rosuvastatin] Other (See Comments)    Joint pain, muscle pain, and hair loss  . Halcion [Triazolam] Other (See Comments)    Dizziness,headaches,bladder problems  . Levaquin [Levofloxacin In D5w] Diarrhea and Itching    Shoulder pain  . Naproxen Sodium Swelling    Patient tolerates in small doses  . Tylenol [Acetaminophen] Swelling    Patient tolerates in small doses  . Cefaclor Other (See Comments)    Doesn't remember---unsure if actually allergic   . Diclofenac Sodium Other (See Comments)    "made very sick"  . Sulfa Antibiotics Itching    Unsure of reaction possibly itching  . Tramadol Itching and Nausea And Vomiting  . Aripiprazole Other (See Comments)    Muscle tension/cramping  . Ibuprofen Swelling    Patient tolerates in small doses    Family History: Family History  Problem Relation Age of Onset  . Cancer Father        Abdomen with mastasis  . Cancer Mother   . Heart disease Mother   . Kidney disease Neg Hx   . Bladder Cancer Neg Hx   . Prostate cancer Neg Hx   . Kidney cancer Neg Hx     Social History:  reports that she quit smoking about 4 years ago. Her smoking use included Cigarettes. She quit after 25.00 years of use. She has never used smokeless tobacco. She reports that she does not drink alcohol or use drugs.  ROS: UROLOGY Frequent Urination?: Yes Hard to postpone urination?: No Burning/pain with urination?: No Get up at night to urinate?: Yes Leakage of urine?: Yes Urine stream starts and stops?: No Trouble starting stream?: Yes Do you have to strain to urinate?: Yes Blood in urine?: No Urinary tract infection?: No Sexually transmitted disease?: No Injury to kidneys or bladder?: No Painful intercourse?: No Weak stream?: No Currently pregnant?: No Vaginal bleeding?: No Last menstrual period?: n  Gastrointestinal Nausea?: No Vomiting?:  No Indigestion/heartburn?: No Diarrhea?: No Constipation?: No  Constitutional Fever: No Night sweats?: Yes Weight loss?: No Fatigue?: No  Skin Skin rash/lesions?:  No Itching?: No  Eyes Blurred vision?: No Double vision?: No  Ears/Nose/Throat Sore throat?: No Sinus problems?: No  Hematologic/Lymphatic Swollen glands?: No Easy bruising?: No  Cardiovascular Leg swelling?: No Chest pain?: No  Respiratory Cough?: No Shortness of breath?: No  Endocrine Excessive thirst?: Yes  Musculoskeletal Back pain?: Yes Joint pain?: Yes  Neurological Headaches?: No Dizziness?: No  Psychologic Depression?: Yes Anxiety?: Yes   Physical Exam: BP 107/68 (BP Location: Right Arm, Patient Position: Sitting, Cuff Size: Normal)   Pulse (!) 105   Ht 5\' 9"  (1.753 m)   Wt 209 lb 4.8 oz (94.9 kg)   LMP 08/23/2014   BMI 30.91 kg/m   Constitutional: Well nourished. Alert and oriented, No acute distress. HEENT: [place] AT, moist mucus membranes. Trachea midline, no masses. Cardiovascular: No clubbing, cyanosis, or edema. Respiratory: Normal respiratory effort, no increased work of breathing. Skin: No rashes, bruises or suspicious lesions. Lymph: No cervical or inguinal adenopathy. Neurologic: Grossly intact, no focal deficits, moving all 4 extremities. Psychiatric: Normal mood and affect.  Laboratory Data: Lab Results  Component Value Date   WBC 7.0 07/27/2017   HGB 13.6 07/27/2017   HCT 40.8 07/27/2017   MCV 96.4 07/27/2017   PLT 326.0 07/27/2017    Lab Results  Component Value Date   CREATININE 0.90 07/27/2017    Lab Results  Component Value Date   HGBA1C 6.1 03/18/2017    Lab Results  Component Value Date   TSH 1.14 03/18/2017       Component Value Date/Time   CHOL 316 (H) 03/18/2017 1018   HDL 50.60 03/18/2017 1018   CHOLHDL 6 03/18/2017 1018   VLDL 53.8 (H) 03/18/2017 1018   LDLCALC 223 (H) 11/13/2014 0908    Lab Results  Component Value Date    AST 15 10/10/2016   Lab Results  Component Value Date   ALT 11 (L) 10/10/2016   I have reviewed the labs   PTNS treatment: The needle electrode was inserted into the lower, inner aspect of the patient's right leg. The surface electrode was placed on the inside arch of the foot on the treatment leg. The lead set was connected to the stimulator and the needle electrode clip was connected to the needle electrode. The stimulator that produces an adjustable electrical pulse that travels to the sacral nerve plexus via the tibial nerve was increased to 10 until the patient received a sensory response.     Assessment & Plan:    1. Frequency  Treatment Plan:  The needle electrode was removed without difficulty to the patient.  Patient tolerated the procedure for 30 minutes.  She will return next week for # 8 out of 12 of their weekly PTNS treatment's  Return in about 1 week (around 09/22/2017) for # 8 PTNS.  These notes generated with voice recognition software. I apologize for typographical errors.  Zara Council, [facility] [address], [place], [place] [address] [phone]

## 2017-09-15 ENCOUNTER — Encounter: Payer: Self-pay | Admitting: Urology

## 2017-09-15 ENCOUNTER — Ambulatory Visit: Payer: PPO | Admitting: Urology

## 2017-09-15 VITALS — BP 107/68 | HR 105 | Ht 69.0 in | Wt 209.3 lb

## 2017-09-15 DIAGNOSIS — R350 Frequency of micturition: Secondary | ICD-10-CM | POA: Diagnosis not present

## 2017-09-15 NOTE — Progress Notes (Signed)
PTNS  Session # 7  Health & Social Factors: changes Caffeine: 1 Alcohol: 0 Daytime voids #per day: 16 Night-time voids #per night: 1 Urgency: mild Incontinence Episodes #per day: 0 Ankle used: right Treatment Setting: 10 Feeling/ Response: both Comments: Patient tolerated well.  Preformed By: Zara Council, PA-C  Assistant: Elberta Leatherwood, CMA  Follow Up: 1 week # 8

## 2017-09-21 NOTE — Progress Notes (Signed)
Chief Complaint:  Chief Complaint  Patient presents with  . PTNS     HPI: Patient is a 56 year old Caucasian female with multiple sclerosis with a history of nephrolithiasis and urinary frequency who presents today to begin 12 weekly treatments of PTNS.  This will be # 8/12.           Anticholinergics - patient failed Vesicare     Myrbetriq was effective, but she found it cost prohibitive      Contraindications present for PTNS      Pacemaker - NO      Implantable defibrillator - NO      History of abnormal bleeding - NO      History of neuropathies or nerve damage - NO  Discussed with patient possible complications of procedure, such as discomfort, bleeding at insertion/stimulation site, procedure consent signed  Patient goals:      Patient is wanting to have the same control over her urinary symptoms as she experienced with the Myrbetriq.    Reemphasized that most patient's will see benefit by the 8th week of treatment, but about 10 to 20% of patient may be late responders.  If they happen to be late responders, it is important to continue with the therapy beyond the 12 weekly treatments as many of those patient find benefit.  Today, she is experiencing frequency, urgency, nocturia, intermittency, hesitancy and straining to urinate.   She is experiencing 18-20 daytime voids (worse), 0  night time voids (improved), a strong urgency (worse) and 0 leakage (stable).   She is not having dysuria, gross hematuria suprapubic pain. She denies fever, chills, nausea and vomiting.   PMH: Past Medical History:  Diagnosis Date  . Arthritis    osteo  . Asthma   . Bipolar disorder ([place]) 05/21/14  . Cataracts, bilateral   . DDD (degenerative disc disease), cervical    also back  . Depression   . Dizziness    Positional  . Edema    feet/legs  . Fibromyalgia syndrome   . Fungal infection    Finger nails  . GERD (gastroesophageal reflux disease)   . Gout   . Headache    seasonal allergies  . Heart palpitations   . 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 Hypercholesterolemia   . Multiple sclerosis (HCC)    weakness  . Nephrolithiasis    kidney stones  . Osteoporosis    osteoarthritis  . Pneumonia   . PONV (postoperative nausea and vomiting)    no problem after cataract surgery  . Psoriasis   . Renal stone   . Shortness of breath dyspnea    wheezing  . Sleep apnea 2012   sleep study / slight, no interventions  . Urinary frequency     Surgical History: Past Surgical History:  Procedure Laterality Date  . EYE SURGERY  2015   tissue biopsy  . FOOT SURGERY  2015  . JOINT REPLACEMENT Left 2013   hip replacement  . LITHOTRIPSY    . thumb surgery Right   . TONSILLECTOMY  1973    Home Medications:  Allergies as of 09/22/2017      Reactions   Albuterol Shortness Of Breath, Other (See Comments)   Makes pt feel jittery/ tacycardic   Crestor [rosuvastatin] Other (See Comments)   Joint pain, muscle pain, and hair loss   Halcion [triazolam] Other (See Comments)   Dizziness,headaches,bladder problems   Levaquin [levofloxacin In D5w] Diarrhea, Itching   Shoulder pain  Naproxen Sodium Swelling   Patient tolerates in small doses   Tylenol [acetaminophen] Swelling   Patient tolerates in small doses   Cefaclor Other (See Comments)   Doesn't remember---unsure if actually allergic    Diclofenac Sodium Other (See Comments)   "made very sick"   Sulfa Antibiotics Itching   Unsure of reaction possibly itching   Tramadol Itching, Nausea And Vomiting   Aripiprazole Other (See Comments)   Muscle tension/cramping   Ibuprofen Swelling   Patient tolerates in small doses      Medication List        Accurate as of 09/22/17 11:59 PM. Always use your most recent med list.          ASPERCREME LIDOCAINE EX Apply 1 application topically 4 (four) times daily as needed (for pain.).   aspirin 325 MG tablet Take 325 mg by mouth every 4 (four) hours as  needed for headache.   AZO-TABS 95 MG tablet Generic drug:  phenazopyridine Take 95 mg by mouth 3 (three) times daily as needed for pain.   Biotin 10000 MCG Tabs Take 30,000 mcg by mouth daily.   calcipotriene-betamethasone ointment Commonly known as:  TACLONEX Apply 1 application topically daily as needed (for psorasis).   clobetasol cream 0.05 % Commonly known as:  TEMOVATE Apply 1 application topically 2 (two) times daily.   cyanocobalamin 1000 MCG/ML injection Commonly known as:  (VITAMIN B-12) Inject 1,000 mcg into the muscle every 30 (thirty) days. For 3 Months (June, July, August)   diclofenac sodium 1 % Gel Commonly known as:  VOLTAREN Apply 4 g topically 4 (four) times daily.   docusate sodium 100 MG capsule Commonly known as:  COLACE Take 1 capsule (100 mg total) by mouth 2 (two) times daily.   DULoxetine 20 MG capsule Commonly known as:  CYMBALTA Take 2 capsules (40 mg total) by mouth at bedtime.   HYDROcodone-acetaminophen 10-325 MG tablet Commonly known as:  NORCO Take 1 tablet by mouth every 6 (six) hours as needed for moderate pain or severe pain.   hydroquinone 4 % cream 1 APPLICATION TOPICALLY DAILY AS NEEDED FOR BLEMISHES.   hydrOXYzine 50 MG capsule Commonly known as:  VISTARIL Take 2 capsules (100 mg total) by mouth at bedtime.   ketorolac 10 MG tablet Commonly known as:  TORADOL Take 1 tablet (10 mg total) by mouth 2 (two) times daily as needed for severe pain.   lamoTRIgine 150 MG tablet Commonly known as:  LAMICTAL Take 2 tablets (300 mg total) by mouth at bedtime.   levalbuterol 45 MCG/ACT inhaler Commonly known as:  XOPENEX HFA Inhale 1-2 puffs into the lungs every 6 (six) hours as needed for wheezing.   Lysine 1000 MG Tabs Take 2,000-4,000 mg by mouth at bedtime. 2000 mg scheduled at bedtime and patient will take 4000 mg if she has outbreak   neomycin-bacitracin-polymyxin [id number] ointment Apply 1 application topically 4 (four)  times daily as needed (for cut/scrapes.).   nicotine 21 mg/24hr patch Commonly known as:  NICODERM CQ - dosed in mg/24 hours Place 1 patch (21 mg total) onto the skin daily.   nitrofurantoin (macrocrystal-monohydrate) 100 MG capsule Commonly known as:  MACROBID Take by mouth.   polyethylene glycol powder powder Commonly known as:  GLYCOLAX/MIRALAX MIX 17 GRAMS (1 CAPFUL) WITH 4-8 OZ OF LIQUID AND TAKE BY MOUTH TWICE DAILY AS NEEDED   temazepam 30 MG capsule Commonly known as:  RESTORIL TAKE ONE TO TWO CAPSULES BY MOUTH NIGHTLY AT BEDTIME  tiZANidine 4 MG tablet Commonly known as:  ZANAFLEX Take 2 tablets (8 mg total) by mouth every 6 (six) hours as needed for muscle spasms.   Vitamin D3 10000 units capsule Take 10,000 Units by mouth daily.   zolpidem 10 MG tablet Commonly known as:  AMBIEN Take 1 tablet (10 mg total) by mouth at bedtime.       Allergies:  Allergies  Allergen Reactions  . Albuterol Shortness Of Breath and Other (See Comments)    Makes pt feel jittery/ tacycardic  . Crestor [Rosuvastatin] Other (See Comments)    Joint pain, muscle pain, and hair loss  . Halcion [Triazolam] Other (See Comments)    Dizziness,headaches,bladder problems  . Levaquin [Levofloxacin In D5w] Diarrhea and Itching    Shoulder pain  . Naproxen Sodium Swelling    Patient tolerates in small doses  . Tylenol [Acetaminophen] Swelling    Patient tolerates in small doses  . Cefaclor Other (See Comments)    Doesn't remember---unsure if actually allergic   . Diclofenac Sodium Other (See Comments)    "made very sick"  . Sulfa Antibiotics Itching    Unsure of reaction possibly itching  . Tramadol Itching and Nausea And Vomiting  . Aripiprazole Other (See Comments)    Muscle tension/cramping  . Ibuprofen Swelling    Patient tolerates in small doses    Family History: Family History  Problem Relation Age of Onset  . Cancer Father        Abdomen with mastasis  . Cancer Mother     . Heart disease Mother   . Kidney disease Neg Hx   . Bladder Cancer Neg Hx   . Prostate cancer Neg Hx   . Kidney cancer Neg Hx     Social History:  reports that she quit smoking about 4 years ago. Her smoking use included cigarettes. She quit after 25.00 years of use. she has never used smokeless tobacco. She reports that she does not drink alcohol or use drugs.  ROS: UROLOGY Frequent Urination?: Yes Hard to postpone urination?: Yes Burning/pain with urination?: No Get up at night to urinate?: No Leakage of urine?: Yes Urine stream starts and stops?: No Trouble starting stream?: No Do you have to strain to urinate?: Yes Blood in urine?: No Urinary tract infection?: No Sexually transmitted disease?: No Injury to kidneys or bladder?: No Painful intercourse?: No Weak stream?: Yes Currently pregnant?: No Vaginal bleeding?: No  Gastrointestinal Nausea?: No Vomiting?: No Indigestion/heartburn?: Yes Diarrhea?: No Constipation?: No  Constitutional Fever: No Night sweats?: Yes Weight loss?: No Fatigue?: Yes  Skin Skin rash/lesions?: No Itching?: No  Eyes Blurred vision?: No Double vision?: No  Ears/Nose/Throat Sore throat?: No Sinus problems?: No  Hematologic/Lymphatic Swollen glands?: No Easy bruising?: No  Cardiovascular Leg swelling?: No Chest pain?: No  Respiratory Cough?: No Shortness of breath?: No  Endocrine Excessive thirst?: No  Musculoskeletal Back pain?: Yes Joint pain?: Yes  Neurological Headaches?: No Dizziness?: No  Psychologic Depression?: Yes Anxiety?: Yes   Physical Exam: BP 119/76   Pulse 89   Ht 5\' 9"  (1.753 m)   Wt 211 lb 9.6 oz (96 kg)   LMP 08/23/2014   BMI 31.25 kg/m   Constitutional: Well nourished. Alert and oriented, No acute distress. HEENT: [place] AT, moist mucus membranes. Trachea midline, no masses. Cardiovascular: No clubbing, cyanosis, or edema. Respiratory: Normal respiratory effort, no increased work  of breathing. Skin: No rashes, bruises or suspicious lesions. Lymph: No cervical or inguinal adenopathy. Neurologic: Grossly  intact, no focal deficits, moving all 4 extremities. Psychiatric: Normal mood and affect.  Laboratory Data: Lab Results  Component Value Date   WBC 7.0 07/27/2017   HGB 13.6 07/27/2017   HCT 40.8 07/27/2017   MCV 96.4 07/27/2017   PLT 326.0 07/27/2017    Lab Results  Component Value Date   CREATININE 0.90 07/27/2017    Lab Results  Component Value Date   HGBA1C 6.1 03/18/2017    Lab Results  Component Value Date   TSH 1.14 03/18/2017       Component Value Date/Time   CHOL 316 (H) 03/18/2017 1018   HDL 50.60 03/18/2017 1018   CHOLHDL 6 03/18/2017 1018   VLDL 53.8 (H) 03/18/2017 1018   LDLCALC 223 (H) 11/13/2014 0908    Lab Results  Component Value Date   AST 15 10/10/2016   Lab Results  Component Value Date   ALT 11 (L) 10/10/2016   I have reviewed the labs   PTNS treatment: The needle electrode was inserted into the lower, inner aspect of the patient's right leg. The surface electrode was placed on the inside arch of the foot on the treatment leg. The lead set was connected to the stimulator and the needle electrode clip was connected to the needle electrode. The stimulator that produces an adjustable electrical pulse that travels to the sacral nerve plexus via the tibial nerve was increased to 14 until the patient received a sensory response and a toe flex.     Assessment & Plan:    1. Frequency  Treatment Plan:  The needle electrode was removed without difficulty to the patient.  Patient tolerated the procedure for 30 minutes.  She will return next week for # 9 out of 12 of their weekly PTNS treatment's  Return in about 1 week (around 09/29/2017) for # 9 PTNS.  These notes generated with voice recognition software. I apologize for typographical errors.  Zara Council, [facility] [address], [place], [place] [address] [phone]

## 2017-09-22 ENCOUNTER — Ambulatory Visit: Payer: PPO | Admitting: Urology

## 2017-09-22 ENCOUNTER — Encounter: Payer: Self-pay | Admitting: Urology

## 2017-09-22 VITALS — BP 119/76 | HR 89 | Ht 69.0 in | Wt 211.6 lb

## 2017-09-22 DIAGNOSIS — R350 Frequency of micturition: Secondary | ICD-10-CM

## 2017-09-22 NOTE — Progress Notes (Signed)
PTNS  Session # 8  Health & Social Factors: No Change Caffeine: 1 Alcohol: 0 Daytime voids #per day: 18-20 Night-time voids #per night: 0 Urgency: Strong Incontinence Episodes #per day: 0 Ankle used: Right Treatment Setting: 14 Feeling/ Response: Both  Preformed By: Zara Council, PA  Assistant: Reece Packer, RN

## 2017-09-24 ENCOUNTER — Ambulatory Visit (HOSPITAL_COMMUNITY): Payer: PPO | Admitting: Psychiatry

## 2017-09-24 ENCOUNTER — Encounter (HOSPITAL_COMMUNITY): Payer: Self-pay | Admitting: Psychiatry

## 2017-09-24 VITALS — BP 118/74 | HR 91 | Ht 69.0 in | Wt 212.0 lb

## 2017-09-24 DIAGNOSIS — F314 Bipolar disorder, current episode depressed, severe, without psychotic features: Secondary | ICD-10-CM

## 2017-09-24 DIAGNOSIS — Z87891 Personal history of nicotine dependence: Secondary | ICD-10-CM | POA: Diagnosis not present

## 2017-09-24 MED ORDER — HYDROXYZINE PAMOATE 50 MG PO CAPS: ORAL_CAPSULE | ORAL | 5 refills | Status: DC

## 2017-09-24 MED ORDER — LAMOTRIGINE 150 MG PO TABS: 300.0000 mg | ORAL_TABLET | Freq: Every day | ORAL | 5 refills | Status: DC

## 2017-09-24 MED ORDER — TEMAZEPAM 30 MG PO CAPS: ORAL_CAPSULE | ORAL | 4 refills | Status: DC

## 2017-09-24 MED ORDER — DESVENLAFAXINE SUCCINATE ER 50 MG PO TB24: 50.0000 mg | ORAL_TABLET | Freq: Every day | ORAL | 3 refills | Status: DC

## 2017-09-24 NOTE — Progress Notes (Signed)
Psychiatric Initial Adult Assessment   Patient Identification: Emily Moon MRN:  [id number] Date of Evaluation:  09/24/2017 Referral Source: From the community Chief Complaint:   Chief Complaint    Follow-up     Visit Diagnos  bipolar disorder Today the patient shares that she's doing worse. Her sleep is very much affected. Although as usual she is inconsistent about poor. She actually has no daytime dysfunction most of the days. There some days where she sleeps very poorly and really as a detrimental effect on her function. It seems like mostly days she does sleep during the night. She gets about 6 hours. Her family is told her that she gets a little night and does "crazy things"  patient says she's eating okay. I do not think the patient is any different than her baseline. All the patient denies any psychotic symptoms. Her MS seems to be stable. The patient finished her second will. She is looking forward for trip to go to [place] see her grandson. I do think she has a lot of things that she wants to live for. I think she is very dramatic. Nonetheless I want to respond.She takes 40 mg of Cymbalta when she takes morbid she says it makes her hyper. Overall she would admit that she is persistently depressed over the last few weeks it did not last month. He says she's more isolated and withdrawn. I think the patient can think and concentrate without a problem. She is not suicidal. She does not trick any alcohol or use any drugs. She says for the most part she's extremely anxious and is mainly related to the polical world. It should be noted that her sister is not well.She says her sister is dying from lung disease. Patient has a conflictual relationship with hersister. She lives with her sister. Called herself primary caregiver for her sister. Patient says get her energy level is low. Anesthesia Post Note  Patient: Emily Moon  Procedure(s) Performed: * No surgery found  *  Anesthes  Patient location:   Post pain:   Post assessment:   Last Vitals:  Vitals:   09/24/17 0837  BP: 118/74  Pulse: 91  SpO2: 94%    Post vital signs:   Level of consciousness:   Complications: . Associated Signs/Symptoms: Depression Symptoms:  hopelessness, (Hypo) Manic Symptoms:   Anxiety Symptoms:   Psychotic Symptoms:   PTSD Symptoms:   Past Psychiatric History: The patient was seen in a psychiatric office and takes multiple medications she's not been recently hospitalized.  Previous Psychotropic Medications: Yes   Substance Abuse History in the last 12 months:  No.  Consequences of Substance Abuse: Negative  Past Medical History:  Past Medical History:  Diagnosis Date  . Arthritis    osteo  . Asthma   . Bipolar disorder ([place]) 05/21/14  . Cataracts, bilateral   . DDD (degenerative disc disease), cervical    also back  . Depression   . Dizziness    Positional  . Edema    feet/legs  . Fibromyalgia syndrome   . Fungal infection    Finger nails  . GERD (gastroesophageal reflux disease)   . Gout   . Headache    seasonal allergies  . Heart palpitations   . 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 Hypercholesterolemia   . Multiple sclerosis (HCC)    weakness  . Nephrolithiasis    kidney stones  . Osteoporosis    osteoarthritis  . Pneumonia   . PONV (postoperative  nausea and vomiting)    no problem after cataract surgery  . Psoriasis   . Renal stone   . Shortness of breath dyspnea    wheezing  . Sleep apnea 2012   sleep study / slight, no interventions  . Urinary frequency     Past Surgical History:  Procedure Laterality Date  . EYE SURGERY  2015   tissue biopsy  . FOOT SURGERY  2015  . JOINT REPLACEMENT Left 2013   hip replacement  . LITHOTRIPSY    . thumb surgery Right   . TONSILLECTOMY  1973    Family Psychiatric History:   Family History:  Family History  Problem Relation Age of Onset  . Cancer Father         Abdomen with mastasis  . Cancer Mother   . Heart disease Mother   . Kidney disease Neg Hx   . Bladder Cancer Neg Hx   . Prostate cancer Neg Hx   . Kidney cancer Neg Hx     Social History:   Social History   Socioeconomic History  . Marital status: Divorced    Spouse name: None  . Number of children: 1  . Years of education: None  . Highest education level: None  Social Needs  . Financial resource strain: None  . Food insecurity - worry: None  . Food insecurity - inability: None  . Transportation needs - medical: None  . Transportation needs - non-medical: None  Occupational History  . 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 Smoking status: Former Smoker    Years: 25.00    Types: Cigarettes    Last attempt to quit: 11/16/2012    Years since quitting: 4.8  . Smokeless tobacco: Never Used  . Tobacco comment: occasional use  Substance and Sexual Activity  . Alcohol use: No    Alcohol/week: 0.0 oz  . Drug use: No  . Sexual activity: No  Other Topics Concern  . None  Social History Narrative  . None    Additional Social History:   Allergies:   Allergies  Allergen Reactions  . Albuterol Shortness Of Breath and Other (See Comments)    Makes pt feel jittery/ tacycardic  . Crestor [Rosuvastatin] Other (See Comments)    Joint pain, muscle pain, and hair loss  . Halcion [Triazolam] Other (See Comments)    Dizziness,headaches,bladder problems  . Levaquin [Levofloxacin In D5w] Diarrhea and Itching    Shoulder pain  . Naproxen Sodium Swelling    Patient tolerates in small doses  . Tylenol [Acetaminophen] Swelling    Patient tolerates in small doses  . Cefaclor Other (See Comments)    Doesn't remember---unsure if actually allergic   . Diclofenac Sodium Other (See Comments)    "made very sick"  . Sulfa Antibiotics Itching    Unsure of reaction possibly itching  . Tramadol Itching and Nausea And Vomiting  . Aripiprazole Other  (See Comments)    Muscle tension/cramping  . Ibuprofen Swelling    Patient tolerates in small doses    Metabolic Disorder Labs: Lab Results  Component Value Date   HGBA1C 6.1 03/18/2017   No results found for: PROLACTIN Lab Results  Component Value Date   CHOL 316 (H) 03/18/2017   TRIG 269.0 (H) 03/18/2017   HDL 50.60 03/18/2017   CHOLHDL 6 03/18/2017   VLDL 53.8 (H) 03/18/2017   LDLCALC 223 (H) 11/13/2014     Current Medications:  Current Outpatient Medications  Medication Sig Dispense Refill  . ASPERCREME LIDOCAINE EX Apply 1 application topically 4 (four) times daily as needed (for pain.).     Marland Kitchen aspirin 325 MG tablet Take 325 mg by mouth every 4 (four) hours as needed for headache.     . Biotin 10000 MCG TABS Take 30,000 mcg by mouth daily.     . calcipotriene-betamethasone (TACLONEX) ointment Apply 1 application topically daily as needed (for psorasis).     . Cholecalciferol (VITAMIN D3) 10000 units capsule Take 10,000 Units by mouth daily.    . clobetasol cream (TEMOVATE) 6.43 % Apply 1 application topically 2 (two) times daily. 30 g 2  . cyanocobalamin (,VITAMIN B-12,) 1000 MCG/ML injection Inject 1,000 mcg into the muscle every 30 (thirty) days. For 3 Months (June, July, August)    . diclofenac sodium (VOLTAREN) 1 % GEL Apply 4 g topically 4 (four) times daily. (Patient taking differently: Apply 4 g topically 4 (four) times daily as needed (for pain.). ) 500 g 5  . docusate sodium (COLACE) 100 MG capsule Take 1 capsule (100 mg total) by mouth 2 (two) times daily. (Patient taking differently: Take 100 mg by mouth 2 (two) times daily as needed. ) 60 capsule 0  . DULoxetine (CYMBALTA) 20 MG capsule Take 2 capsules (40 mg total) by mouth at bedtime. 60 capsule 5  . HYDROcodone-acetaminophen (NORCO) 10-325 MG tablet Take 1 tablet by mouth every 6 (six) hours as needed for moderate pain or severe pain. 120 tablet 0  . hydroquinone 4 % cream 1 APPLICATION TOPICALLY DAILY AS NEEDED  FOR BLEMISHES.  3  . hydrOXYzine (VISTARIL) 50 MG capsule 2 q hs   1   prn 90 capsule 5  . ketorolac (TORADOL) 10 MG tablet Take 1 tablet (10 mg total) by mouth 2 (two) times daily as needed for severe pain. 60 tablet 0  . lamoTRIgine (LAMICTAL) 150 MG tablet Take 2 tablets (300 mg total) at bedtime by mouth. 60 tablet 5  . levalbuterol (XOPENEX HFA) 45 MCG/ACT inhaler Inhale 1-2 puffs into the lungs every 6 (six) hours as needed for wheezing. 1 Inhaler 5  . Lysine 1000 MG TABS Take 2,000-4,000 mg by mouth at bedtime. 2000 mg scheduled at bedtime and patient will take 4000 mg if she has outbreak    . neomycin-bacitracin-polymyxin (NEOSPORIN) [phone] ointment Apply 1 application topically 4 (four) times daily as needed (for cut/scrapes.).    Marland Kitchen nicotine (NICODERM CQ - DOSED IN MG/24 HOURS) 21 mg/24hr patch Place 1 patch (21 mg total) onto the skin daily. 28 patch 3  . nitrofurantoin, macrocrystal-monohydrate, (MACROBID) 100 MG capsule Take by mouth.    . phenazopyridine (AZO-TABS) 95 MG tablet Take 95 mg by mouth 3 (three) times daily as needed for pain.    . polyethylene glycol powder (GLYCOLAX/MIRALAX) powder MIX 17 GRAMS (1 CAPFUL) WITH 4-8 OZ OF LIQUID AND TAKE BY MOUTH TWICE DAILY AS NEEDED (Patient taking differently: MIX 17 GRAMS (1 CAPFUL) WITH 4-8 OZ OF LIQUID AND TAKE BY MOUTH TWICE DAILY AS NEEDED FOR CONSTIPATION) 527 g 0  . temazepam (RESTORIL) 30 MG capsule TAKE ONE TO TWO CAPSULES BY MOUTH NIGHTLY AT BEDTIME 60 capsule 4  . tiZANidine (ZANAFLEX) 4 MG tablet Take 2 tablets (8 mg total) by mouth every 6 (six) hours as needed for muscle spasms. 720 tablet 3  . zolpidem (AMBIEN) 10 MG tablet Take 1 tablet (10 mg total) by mouth at bedtime. 135 tablet 0  .  desvenlafaxine (PRISTIQ) 50 MG 24 hr tablet Take 1 tablet (50 mg total) daily by mouth. 30 tablet 3   No current facility-administered medications for this visit.     Neurologic: Headache: No Seizure:  No Paresthesias:No  Musculoskeletal: Strength & Muscle Tone: within normal limits Gait & Station: normal Patient leans: N/A  Psychiatric Specialty Exam: ROS  Blood pressure 118/74, pulse 91, height 5\' 9"  (1.753 m), weight 212 lb (96.2 kg), last menstrual period 08/23/2014, SpO2 94 %.Body mass index is 31.31 kg/m.  General Appearance: Casual  Eye Contact:  Good  Speech:  Clear and Coherent  Volume:  Normal  Mood:  Negative  Affect:  Appropriate  Thought Process:  Goal Directed  Orientation:  NA  Thought Content:  Logical  Suicidal Thoughts:  No  Homicidal Thoughts:  No  Memory:  Negative  Judgement:  Good  Insight:  Good  Psychomotor Activity:  Normal  Concentration:    Recall:    Fund of Knowledge:Good  Language: Good  Akathisia:  No  Handed:  Right  AIMS (if indicated):    Assets:  Desire for Improvement  ADL's:  Intact  Cognition: WNL  Sleep:      11/9/20189:10 AM   At this timewe will go ahead and switch her Cymbalta out and give her Pristiq 50 mg. She'll begin this which tonight.The patient will discontinue the Ambien which I do not think is helping and she'll go to Restoril 15 mg Lamictal150 mg 2 at night. She was taking this before not clear why she is not taking it now. She'll continue relatively high-dose Lamictal that she's been on for years. She takes 300 mg. Medically believe she is stable. This patient to return to see me in approximately 2 months. So essentially we are switchingPristiq for Cymbalta getting rid of her Ambien in making her inconsistent about taking Restoril.

## 2017-09-27 ENCOUNTER — Encounter: Payer: Self-pay | Admitting: Family Medicine

## 2017-09-27 ENCOUNTER — Encounter: Payer: Self-pay | Admitting: Primary Care

## 2017-09-27 ENCOUNTER — Ambulatory Visit: Payer: PPO | Admitting: Family Medicine

## 2017-09-27 VITALS — BP 138/78 | HR 107 | Temp 99.4°F | Wt 210.0 lb

## 2017-09-27 DIAGNOSIS — J22 Unspecified acute lower respiratory infection: Secondary | ICD-10-CM

## 2017-09-27 MED ORDER — PREDNISONE 20 MG PO TABS: 20.0000 mg | ORAL_TABLET | Freq: Every day | ORAL | 0 refills | Status: DC

## 2017-09-27 MED ORDER — BENZONATATE 100 MG PO CAPS: 100.0000 mg | ORAL_CAPSULE | Freq: Three times a day (TID) | ORAL | 0 refills | Status: DC | PRN

## 2017-09-27 MED ORDER — AZITHROMYCIN 250 MG PO TABS: ORAL_TABLET | ORAL | 0 refills | Status: DC

## 2017-09-27 NOTE — Telephone Encounter (Signed)
Can we get her in with [facility] or another provider tomorrow? Please check. Thanks.

## 2017-09-27 NOTE — Progress Notes (Signed)
Subjective:    Patient ID: Emily Moon, female    DOB: [birth year], 56 y.o.   MRN: [id number]  HPI This is a 56 yo female who presents today with 6 days of fatigue, cough. Sputum is yellow, nasal drainage. Temperature 101 this morning.  Took some expired tessalon perles and robitussin with a little relief. Scratchy voice. Feels winded and hearing wheezing.  Feels worse today than yesterday. Taking small amounts ibuprofen. Using Xopenex a couple of times a day.  Is scheduled to travel out of state next week, is concerned about visiting her son and his family.   Past Medical History:  Diagnosis Date  . Arthritis    osteo  . Asthma   . Bipolar disorder ([place]) 05/21/14  . Cataracts, bilateral   . DDD (degenerative disc disease), cervical    also back  . Depression   . Dizziness    Positional  . Edema    feet/legs  . Fibromyalgia syndrome   . Fungal infection    Finger nails  . GERD (gastroesophageal reflux disease)   . Gout   . Headache    seasonal allergies  . Heart palpitations   . 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 Hypercholesterolemia   . Multiple sclerosis (HCC)    weakness  . Nephrolithiasis    kidney stones  . Osteoporosis    osteoarthritis  . Pneumonia   . PONV (postoperative nausea and vomiting)    no problem after cataract surgery  . Psoriasis   . Renal stone   . Shortness of breath dyspnea    wheezing  . Sleep apnea 2012   sleep study / slight, no interventions  . Urinary frequency    Past Surgical History:  Procedure Laterality Date  . EYE SURGERY  2015   tissue biopsy  . FOOT SURGERY  2015  . JOINT REPLACEMENT Left 2013   hip replacement  . LITHOTRIPSY    . thumb surgery Right   . TONSILLECTOMY  1973   Family History  Problem Relation Age of Onset  . Cancer Father        Abdomen with mastasis  . Cancer Mother   . Heart disease Mother   . Kidney disease Neg Hx   . Bladder Cancer Neg Hx   . Prostate cancer Neg Hx   . Kidney  cancer Neg Hx    Social History   Tobacco Use  . Smoking status: Former Smoker    Years: 25.00    Types: Cigarettes    Last attempt to quit: 11/16/2012    Years since quitting: 4.8  . Smokeless tobacco: Never Used  . Tobacco comment: occasional use  Substance Use Topics  . Alcohol use: No    Alcohol/week: 0.0 oz  . Drug use: No      Review of Systems Per Hpi    Objective:   Physical Exam  Constitutional: She is oriented to person, place, and time. She appears well-developed and well-nourished. She appears ill. No distress.  HENT:  Head: Normocephalic and atraumatic.  Right Ear: Tympanic membrane, external ear and ear canal normal.  Left Ear: Tympanic membrane, external ear and ear canal normal.  Nose: Mucosal edema and rhinorrhea present.  Mouth/Throat: Uvula is midline and oropharynx is clear and moist.  Eyes: Conjunctivae are normal.  Neck: Normal range of motion. Neck supple.  Cardiovascular: Normal rate, regular rhythm and normal heart sounds.  Pulmonary/Chest: Effort normal. She has wheezes (scattered, expiratory).  Lymphadenopathy:  She has no cervical adenopathy.  Neurological: She is alert and oriented to person, place, and time.  Skin: Skin is warm. She is diaphoretic.  Psychiatric: She has a normal mood and affect. Her behavior is normal. Judgment and thought content normal.  Vitals reviewed.     BP 138/78 (BP Location: Left Arm, Patient Position: Sitting, Cuff Size: Normal)   Pulse (!) 107   Temp 99.4 F (37.4 C) (Oral)   Wt 210 lb (95.3 kg)   LMP 08/23/2014   SpO2 97%   BMI 31.01 kg/m  Wt Readings from Last 3 Encounters:  09/27/17 210 lb (95.3 kg)  09/22/17 211 lb 9.6 oz (96 kg)  09/15/17 209 lb 4.8 oz (94.9 kg)       Assessment & Plan:  1. Lower respiratory infection - continue Xopenex, increase fluids, add Mucinex - RTC precautions reviewed - azithromycin (ZITHROMAX) 250 MG tablet; Take 2 tabs PO x 1 dose, then 1 tab PO QD x 4 days   Dispense: 6 tablet; Refill: 0 - benzonatate (TESSALON) 100 MG capsule; Take 1-2 capsules (100-200 mg total) 3 (three) times daily as needed by mouth for cough.  Dispense: 40 capsule; Refill: 0 - predniSONE (DELTASONE) 20 MG tablet; Take 1 tablet (20 mg total) daily with breakfast by mouth.  Dispense: 5 tablet; Refill: 0   Clarene Reamer, FNP-BC  [facility]  09/27/2017 2:35 PM

## 2017-09-27 NOTE — Patient Instructions (Addendum)
I have sent in three medications to your pharmacy- an antibiotic, tessalon perles and prednisone  Can also add mucinex- plain, to loosen secretions  If not better at end of week, please let me know

## 2017-09-28 NOTE — Progress Notes (Deleted)
Chief Complaint:  No chief complaint on file.    HPI: Patient is a 56 year old Caucasian female with multiple sclerosis with a history of nephrolithiasis and urinary frequency who presents today to begin 12 weekly treatments of PTNS.  This will be # 9/12.           Anticholinergics - patient failed Vesicare     Myrbetriq was effective, but she found it cost prohibitive      Contraindications present for PTNS      Pacemaker - NO      Implantable defibrillator - NO      History of abnormal bleeding - NO      History of neuropathies or nerve damage - NO  Discussed with patient possible complications of procedure, such as discomfort, bleeding at insertion/stimulation site, procedure consent signed  Patient goals:      Patient is wanting to have the same control over her urinary symptoms as she experienced with the Myrbetriq.    Reemphasized that most patient's will see benefit by the 8th week of treatment, but about 10 to 20% of patient may be late responders.  If they happen to be late responders, it is important to continue with the therapy beyond the 12 weekly treatments as many of those patient find benefit.  Today, she is experiencing frequency, urgency, nocturia, intermittency, hesitancy and straining to urinate.   She is experiencing 18-20 daytime voids (worse), 0  night time voids (improved), a strong urgency (worse) and 0 leakage (stable).   She is not having dysuria, gross hematuria suprapubic pain. She denies fever, chills, nausea and vomiting.   PMH: Past Medical History:  Diagnosis Date  . Arthritis    osteo  . Asthma   . Bipolar disorder ([place]) 05/21/14  . Cataracts, bilateral   . DDD (degenerative disc disease), cervical    also back  . Depression   . Dizziness    Positional  . Edema    feet/legs  . Fibromyalgia syndrome   . Fungal infection    Finger nails  . GERD (gastroesophageal reflux disease)   . Gout   . Headache    seasonal allergies  .  Heart palpitations   . 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 Hypercholesterolemia   . Multiple sclerosis (HCC)    weakness  . Nephrolithiasis    kidney stones  . Osteoporosis    osteoarthritis  . Pneumonia   . PONV (postoperative nausea and vomiting)    no problem after cataract surgery  . Psoriasis   . Renal stone   . Shortness of breath dyspnea    wheezing  . Sleep apnea 2012   sleep study / slight, no interventions  . Urinary frequency     Surgical History: Past Surgical History:  Procedure Laterality Date  . EYE SURGERY  2015   tissue biopsy  . FOOT SURGERY  2015  . JOINT REPLACEMENT Left 2013   hip replacement  . LITHOTRIPSY    . thumb surgery Right   . TONSILLECTOMY  1973    Home Medications:  Allergies as of 09/29/2017      Reactions   Albuterol Shortness Of Breath, Other (See Comments)   Makes pt feel jittery/ tacycardic   Crestor [rosuvastatin] Other (See Comments)   Joint pain, muscle pain, and hair loss   Halcion [triazolam] Other (See Comments)   Dizziness,headaches,bladder problems   Levaquin [levofloxacin In D5w] Diarrhea, Itching   Shoulder pain   Naproxen Sodium Swelling  Patient tolerates in small doses   Tylenol [acetaminophen] Swelling   Patient tolerates in small doses   Cefaclor Other (See Comments)   Doesn't remember---unsure if actually allergic    Diclofenac Sodium Other (See Comments)   "made very sick"   Sulfa Antibiotics Itching   Unsure of reaction possibly itching   Tramadol Itching, Nausea And Vomiting   Aripiprazole Other (See Comments)   Muscle tension/cramping   Ibuprofen Swelling   Patient tolerates in small doses      Medication List        Accurate as of 09/28/17  2:38 PM. Always use your most recent med list.          ASPERCREME LIDOCAINE EX Apply 1 application topically 4 (four) times daily as needed (for pain.).   aspirin 325 MG tablet Take 325 mg by mouth every 4 (four) hours as needed for headache.    azithromycin 250 MG tablet Commonly known as:  ZITHROMAX Take 2 tabs PO x 1 dose, then 1 tab PO QD x 4 days   AZO-TABS 95 MG tablet Generic drug:  phenazopyridine Take 95 mg by mouth 3 (three) times daily as needed for pain.   benzonatate 100 MG capsule Commonly known as:  TESSALON Take 1-2 capsules (100-200 mg total) 3 (three) times daily as needed by mouth for cough.   Biotin 10000 MCG Tabs Take 30,000 mcg by mouth daily.   calcipotriene-betamethasone ointment Commonly known as:  TACLONEX Apply 1 application topically daily as needed (for psorasis).   clobetasol cream 0.05 % Commonly known as:  TEMOVATE Apply 1 application topically 2 (two) times daily.   cyanocobalamin 1000 MCG/ML injection Commonly known as:  (VITAMIN B-12) Inject 1,000 mcg into the muscle every 30 (thirty) days. For 3 Months (June, July, August)   desvenlafaxine 50 MG 24 hr tablet Commonly known as:  PRISTIQ Take 1 tablet (50 mg total) daily by mouth.   diclofenac sodium 1 % Gel Commonly known as:  VOLTAREN Apply 4 g topically 4 (four) times daily.   docusate sodium 100 MG capsule Commonly known as:  COLACE Take 1 capsule (100 mg total) by mouth 2 (two) times daily.   DULoxetine 20 MG capsule Commonly known as:  CYMBALTA Take 2 capsules (40 mg total) by mouth at bedtime.   HYDROcodone-acetaminophen 10-325 MG tablet Commonly known as:  NORCO Take 1 tablet by mouth every 6 (six) hours as needed for moderate pain or severe pain.   hydroquinone 4 % cream 1 APPLICATION TOPICALLY DAILY AS NEEDED FOR BLEMISHES.   hydrOXYzine 50 MG capsule Commonly known as:  VISTARIL 2 q hs   1   prn   ketorolac 10 MG tablet Commonly known as:  TORADOL Take 1 tablet (10 mg total) by mouth 2 (two) times daily as needed for severe pain.   lamoTRIgine 150 MG tablet Commonly known as:  LAMICTAL Take 2 tablets (300 mg total) at bedtime by mouth.   levalbuterol 45 MCG/ACT inhaler Commonly known as:  XOPENEX  HFA Inhale 1-2 puffs into the lungs every 6 (six) hours as needed for wheezing.   Lysine 1000 MG Tabs Take 2,000-4,000 mg by mouth at bedtime. 2000 mg scheduled at bedtime and patient will take 4000 mg if she has outbreak   neomycin-bacitracin-polymyxin [phone] ointment Apply 1 application topically 4 (four) times daily as needed (for cut/scrapes.).   nicotine 21 mg/24hr patch Commonly known as:  NICODERM CQ - dosed in mg/24 hours Place 1 patch (21 mg  total) onto the skin daily.   nitrofurantoin (macrocrystal-monohydrate) 100 MG capsule Commonly known as:  MACROBID Take by mouth.   polyethylene glycol powder powder Commonly known as:  GLYCOLAX/MIRALAX MIX 17 GRAMS (1 CAPFUL) WITH 4-8 OZ OF LIQUID AND TAKE BY MOUTH TWICE DAILY AS NEEDED   predniSONE 20 MG tablet Commonly known as:  DELTASONE Take 1 tablet (20 mg total) daily with breakfast by mouth.   temazepam 30 MG capsule Commonly known as:  RESTORIL TAKE ONE TO TWO CAPSULES BY MOUTH NIGHTLY AT BEDTIME   tiZANidine 4 MG tablet Commonly known as:  ZANAFLEX Take 2 tablets (8 mg total) by mouth every 6 (six) hours as needed for muscle spasms.   Vitamin D3 10000 units capsule Take 10,000 Units by mouth daily.   zolpidem 10 MG tablet Commonly known as:  AMBIEN Take 1 tablet (10 mg total) by mouth at bedtime.       Allergies:  Allergies  Allergen Reactions  . Albuterol Shortness Of Breath and Other (See Comments)    Makes pt feel jittery/ tacycardic  . Crestor [Rosuvastatin] Other (See Comments)    Joint pain, muscle pain, and hair loss  . Halcion [Triazolam] Other (See Comments)    Dizziness,headaches,bladder problems  . Levaquin [Levofloxacin In D5w] Diarrhea and Itching    Shoulder pain  . Naproxen Sodium Swelling    Patient tolerates in small doses  . Tylenol [Acetaminophen] Swelling    Patient tolerates in small doses  . Cefaclor Other (See Comments)    Doesn't remember---unsure if actually allergic   .  Diclofenac Sodium Other (See Comments)    "made very sick"  . Sulfa Antibiotics Itching    Unsure of reaction possibly itching  . Tramadol Itching and Nausea And Vomiting  . Aripiprazole Other (See Comments)    Muscle tension/cramping  . Ibuprofen Swelling    Patient tolerates in small doses    Family History: Family History  Problem Relation Age of Onset  . Cancer Father        Abdomen with mastasis  . Cancer Mother   . Heart disease Mother   . Kidney disease Neg Hx   . Bladder Cancer Neg Hx   . Prostate cancer Neg Hx   . Kidney cancer Neg Hx     Social History:  reports that she quit smoking about 4 years ago. Her smoking use included cigarettes. She quit after 25.00 years of use. she has never used smokeless tobacco. She reports that she does not drink alcohol or use drugs.  ROS:                                         Physical Exam: LMP 08/23/2014   Constitutional: Well nourished. Alert and oriented, No acute distress. HEENT: [place] AT, moist mucus membranes. Trachea midline, no masses. Cardiovascular: No clubbing, cyanosis, or edema. Respiratory: Normal respiratory effort, no increased work of breathing. Skin: No rashes, bruises or suspicious lesions. Lymph: No cervical or inguinal adenopathy. Neurologic: Grossly intact, no focal deficits, moving all 4 extremities. Psychiatric: Normal mood and affect.  Laboratory Data: Lab Results  Component Value Date   WBC 7.0 07/27/2017   HGB 13.6 07/27/2017   HCT 40.8 07/27/2017   MCV 96.4 07/27/2017   PLT 326.0 07/27/2017    Lab Results  Component Value Date   CREATININE 0.90 07/27/2017    Lab Results  Component Value Date   HGBA1C 6.1 03/18/2017    Lab Results  Component Value Date   TSH 1.14 03/18/2017       Component Value Date/Time   CHOL 316 (H) 03/18/2017 1018   HDL 50.60 03/18/2017 1018   CHOLHDL 6 03/18/2017 1018   VLDL 53.8 (H) 03/18/2017 1018   LDLCALC 223 (H) 11/13/2014  0908    Lab Results  Component Value Date   AST 15 10/10/2016   Lab Results  Component Value Date   ALT 11 (L) 10/10/2016   I have reviewed the labs   PTNS treatment: The needle electrode was inserted into the lower, inner aspect of the patient's right leg. The surface electrode was placed on the inside arch of the foot on the treatment leg. The lead set was connected to the stimulator and the needle electrode clip was connected to the needle electrode. The stimulator that produces an adjustable electrical pulse that travels to the sacral nerve plexus via the tibial nerve was increased to 14 until the patient received a sensory response and a toe flex.     Assessment & Plan:    1. Frequency  Treatment Plan:  The needle electrode was removed without difficulty to the patient.  Patient tolerated the procedure for 30 minutes.  She will return next week for # 10 out of 12 of their weekly PTNS treatment's  No Follow-up on file.  These notes generated with voice recognition software. I apologize for typographical errors.  Zara Council, [facility] [address], [place], [place] [address] [phone]

## 2017-09-29 ENCOUNTER — Encounter: Payer: Self-pay | Admitting: Primary Care

## 2017-09-29 ENCOUNTER — Ambulatory Visit: Payer: Self-pay | Admitting: Urology

## 2017-10-01 ENCOUNTER — Encounter: Payer: Self-pay | Admitting: Primary Care

## 2017-10-01 ENCOUNTER — Ambulatory Visit (HOSPITAL_COMMUNITY): Payer: Self-pay | Admitting: Psychiatry

## 2017-10-05 ENCOUNTER — Other Ambulatory Visit: Payer: Self-pay | Admitting: Primary Care

## 2017-10-05 ENCOUNTER — Ambulatory Visit (INDEPENDENT_AMBULATORY_CARE_PROVIDER_SITE_OTHER)
Admission: RE | Admit: 2017-10-05 | Discharge: 2017-10-05 | Disposition: A | Discharge status: Home / Self Care | Payer: PPO | Source: Ambulatory Visit | Attending: Primary Care | Admitting: Primary Care

## 2017-10-05 ENCOUNTER — Encounter: Payer: Self-pay | Admitting: Primary Care

## 2017-10-05 DIAGNOSIS — R059 Cough, unspecified: Secondary | ICD-10-CM

## 2017-10-05 DIAGNOSIS — R05 Cough: Secondary | ICD-10-CM

## 2017-10-05 IMAGING — DX DG CHEST 2V
2 series · 4 of 4 positions shown · non-contrast
Comparison: Chest x-ray portable of [DATE]

CLINICAL DATA: Cough, history of tobacco abuse, history of multiple
sclerosis

EXAM:
CHEST  2 VIEW

[Series 1: chest pa · 0.14mm/px · 3 of 3 slices shown]
[im 1/3]
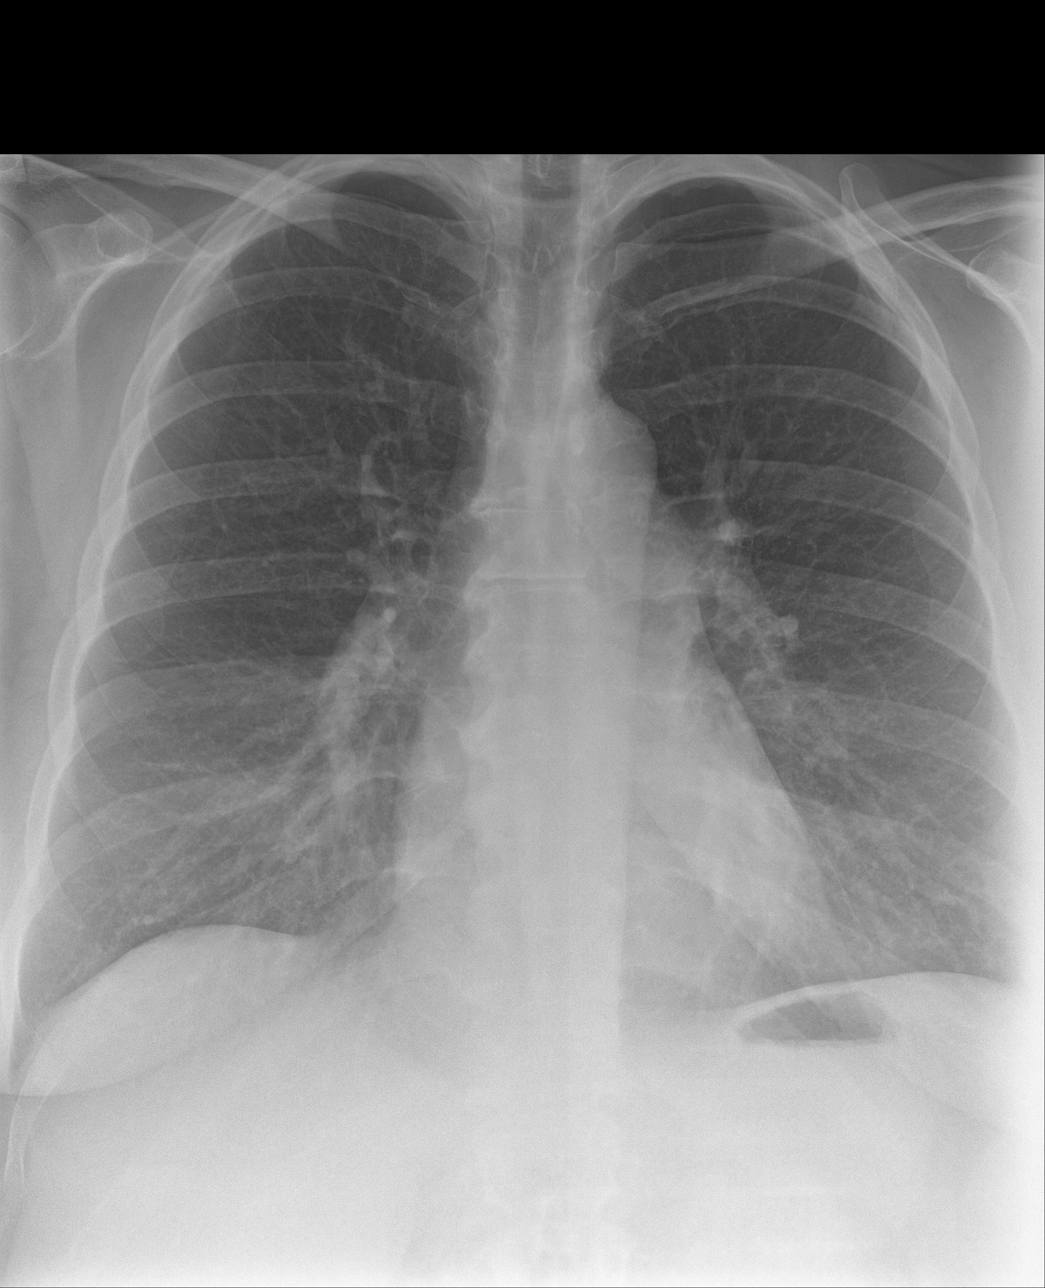
[im 2/3]
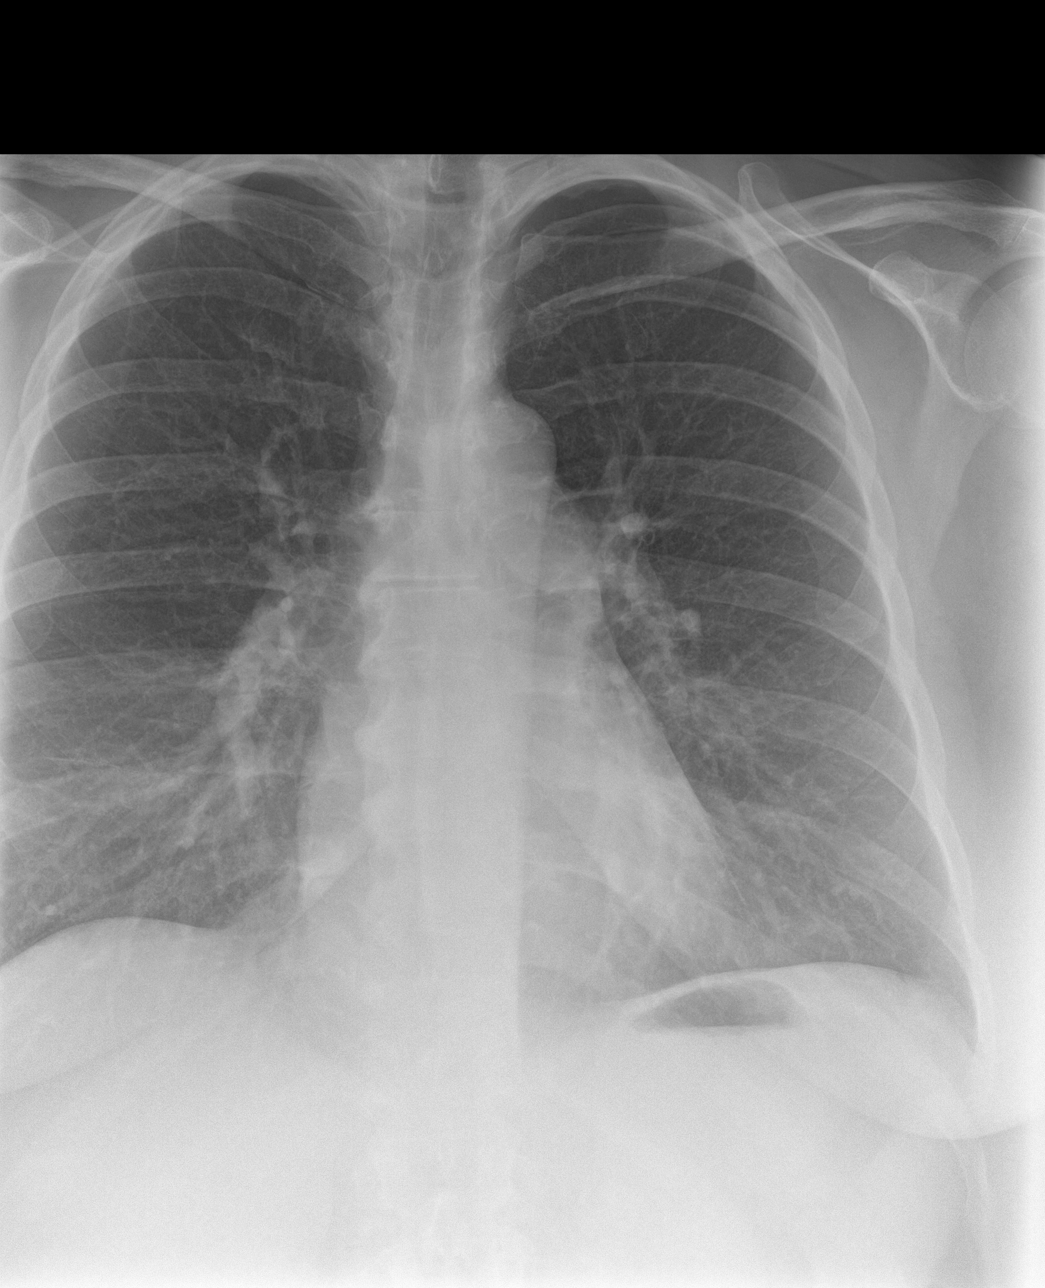
[im 3/3]
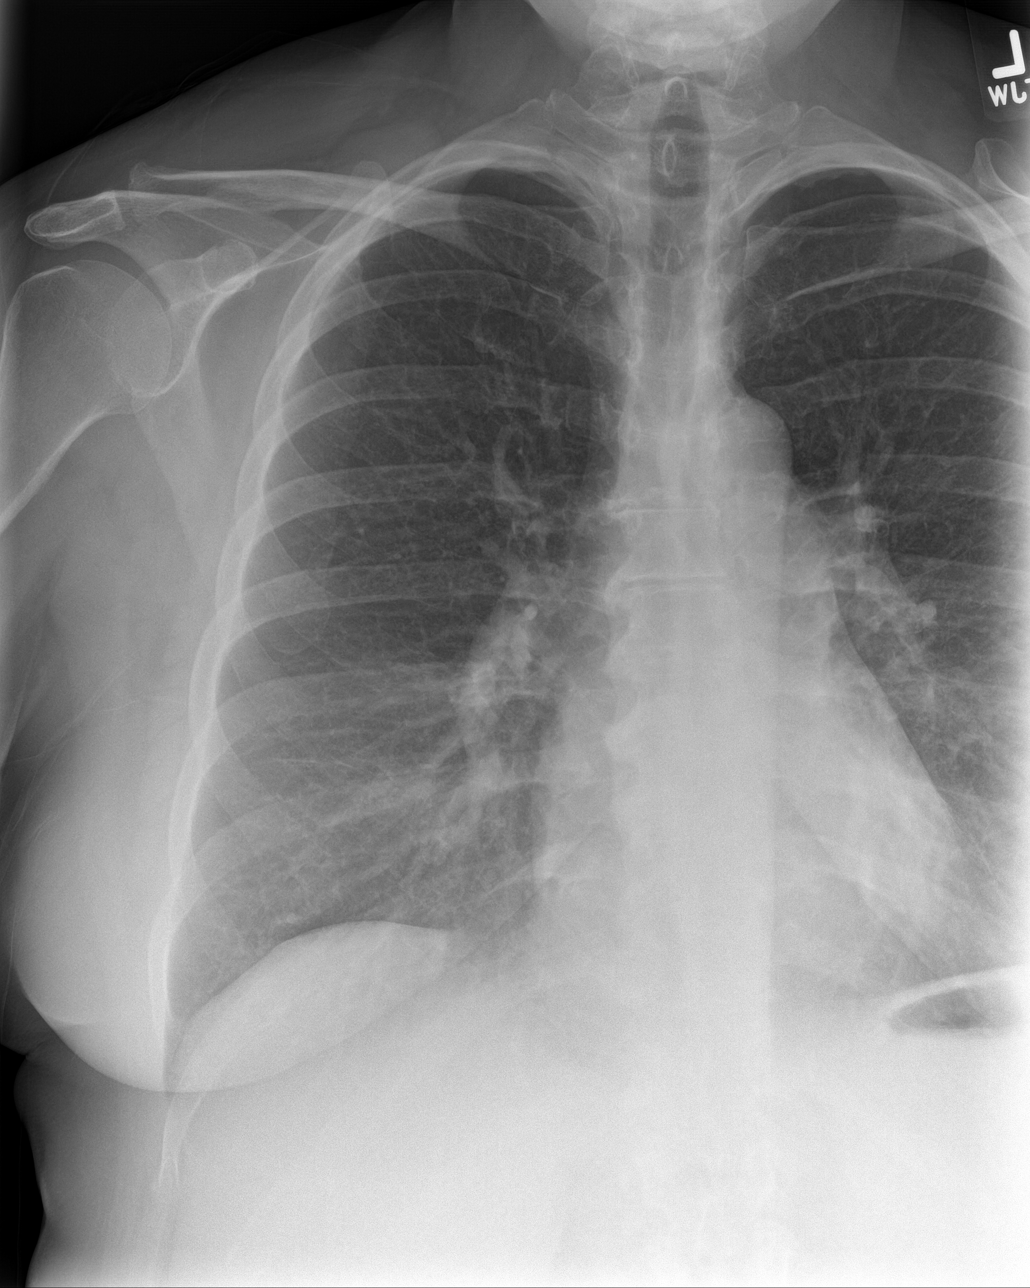

[chest lat]
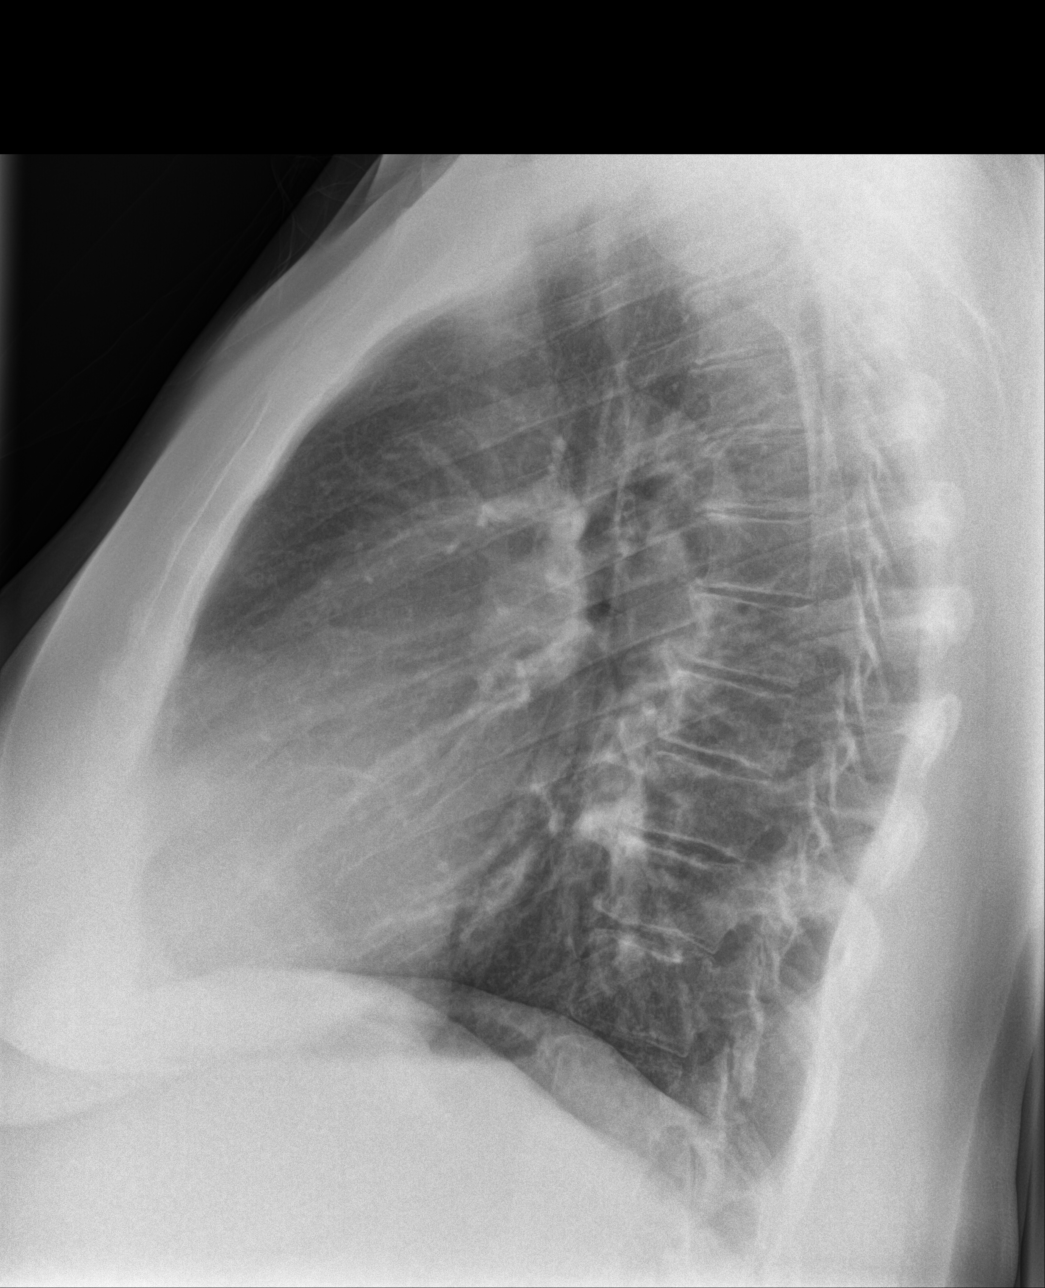

[4 of 4 positions shown; findings below may reference images not displayed]

FINDINGS: No active infiltrate or effusion is seen. Mediastinal and hilar
contours are unremarkable. The heart is within normal limits in
size. There are degenerative changes in the mid to lower thoracic
spine.
IMPRESSION: No active cardiopulmonary disease.

## 2017-10-05 NOTE — Progress Notes (Deleted)
Chief Complaint:  No chief complaint on file.    HPI: Patient is a 56 year old Caucasian female with multiple sclerosis with a history of nephrolithiasis and urinary frequency who presents today to begin 12 weekly treatments of PTNS.  This will be # 9/12.           Anticholinergics - patient failed Vesicare     Myrbetriq was effective, but she found it cost prohibitive      Contraindications present for PTNS      Pacemaker - NO      Implantable defibrillator - NO      History of abnormal bleeding - NO      History of neuropathies or nerve damage - NO  Discussed with patient possible complications of procedure, such as discomfort, bleeding at insertion/stimulation site, procedure consent signed  Patient goals:      Patient is wanting to have the same control over her urinary symptoms as she experienced with the Myrbetriq.    Reemphasized that most patient's will see benefit by the 8th week of treatment, but about 10 to 20% of patient may be late responders.  If they happen to be late responders, it is important to continue with the therapy beyond the 12 weekly treatments as many of those patient find benefit.  Today, she is experiencing frequency, urgency, nocturia, intermittency, hesitancy and straining to urinate.   She is experiencing 18-20 daytime voids (worse), 0  night time voids (improved), a strong urgency (worse) and 0 leakage (stable).   She is not having dysuria, gross hematuria suprapubic pain. She denies fever, chills, nausea and vomiting.   PMH: Past Medical History:  Diagnosis Date  . Arthritis    osteo  . Asthma   . Bipolar disorder ([place]) 05/21/14  . Cataracts, bilateral   . DDD (degenerative disc disease), cervical    also back  . Depression   . Dizziness    Positional  . Edema    feet/legs  . Fibromyalgia syndrome   . Fungal infection    Finger nails  . GERD (gastroesophageal reflux disease)   . Gout   . Headache    seasonal allergies  .  Heart palpitations   . 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 Hypercholesterolemia   . Multiple sclerosis (HCC)    weakness  . Nephrolithiasis    kidney stones  . Osteoporosis    osteoarthritis  . Pneumonia   . PONV (postoperative nausea and vomiting)    no problem after cataract surgery  . Psoriasis   . Renal stone   . Shortness of breath dyspnea    wheezing  . Sleep apnea 2012   sleep study / slight, no interventions  . Urinary frequency     Surgical History: Past Surgical History:  Procedure Laterality Date  . CATARACT EXTRACTION W/PHACO Left 05/21/2015   Procedure: CATARACT EXTRACTION PHACO AND INTRAOCULAR LENS PLACEMENT (IOC);  Surgeon: Birder Robson, MD;  Location: [facility];  Service: Ophthalmology;  Laterality: Left;  [place] 00:35 AP% 22.9 CDE 8.11 fluid pack lot #[id number]  . CATARACT EXTRACTION W/PHACO Right 06/04/2015   Procedure: CATARACT EXTRACTION PHACO AND INTRAOCULAR LENS PLACEMENT (IOC);  Surgeon: Birder Robson, MD;  Location: [facility];  Service: Ophthalmology;  Laterality: Right;  US:00:48 AP%: 10.5 CDE:5.08 Fluid lot #[id number]  . CYSTOSCOPY/URETEROSCOPY/HOLMIUM LASER/STENT PLACEMENT Bilateral 09/22/2016   Procedure: CYSTOSCOPY/URETEROSCOPY/HOLMIUM LASER/STENT PLACEMENT;  Surgeon: Hollice Espy, MD;  Location: [facility];  Service: Urology;  Laterality: Bilateral;  . EYE SURGERY  2015  tissue biopsy  . FOOT SURGERY  2015  . JOINT REPLACEMENT Left 2013   hip replacement  . LITHOTRIPSY    . PTOSIS REPAIR Bilateral 02/18/2016   Procedure: BILATERAL PTOSIS REPAIR UPPER EYELIDS;  Surgeon: Karle Starch, MD;  Location: [facility];  Service: Ophthalmology;  Laterality: Bilateral;  LEAVE PT EARLY AM  . thumb surgery Right   . TONSILLECTOMY  1973    Home Medications:  Allergies as of 10/06/2017      Reactions   Albuterol Shortness Of Breath, Other (See Comments)   Makes pt feel jittery/ tacycardic   Crestor [rosuvastatin] Other (See Comments)   Joint  pain, muscle pain, and hair loss   Halcion [triazolam] Other (See Comments)   Dizziness,headaches,bladder problems   Levaquin [levofloxacin In D5w] Diarrhea, Itching   Shoulder pain   Naproxen Sodium Swelling   Patient tolerates in small doses   Tylenol [acetaminophen] Swelling   Patient tolerates in small doses   Cefaclor Other (See Comments)   Doesn't remember---unsure if actually allergic    Diclofenac Sodium Other (See Comments)   "made very sick"   Sulfa Antibiotics Itching   Unsure of reaction possibly itching   Tramadol Itching, Nausea And Vomiting   Aripiprazole Other (See Comments)   Muscle tension/cramping   Ibuprofen Swelling   Patient tolerates in small doses      Medication List        Accurate as of 10/05/17  8:41 PM. Always use your most recent med list.          ASPERCREME LIDOCAINE EX Apply 1 application topically 4 (four) times daily as needed (for pain.).   aspirin 325 MG tablet Take 325 mg by mouth every 4 (four) hours as needed for headache.   azithromycin 250 MG tablet Commonly known as:  ZITHROMAX Take 2 tabs PO x 1 dose, then 1 tab PO QD x 4 days   AZO-TABS 95 MG tablet Generic drug:  phenazopyridine Take 95 mg by mouth 3 (three) times daily as needed for pain.   benzonatate 100 MG capsule Commonly known as:  TESSALON Take 1-2 capsules (100-200 mg total) 3 (three) times daily as needed by mouth for cough.   Biotin 10000 MCG Tabs Take 30,000 mcg by mouth daily.   calcipotriene-betamethasone ointment Commonly known as:  TACLONEX Apply 1 application topically daily as needed (for psorasis).   clobetasol cream 0.05 % Commonly known as:  TEMOVATE Apply 1 application topically 2 (two) times daily.   cyanocobalamin 1000 MCG/ML injection Commonly known as:  (VITAMIN B-12) Inject 1,000 mcg into the muscle every 30 (thirty) days. For 3 Months (June, July, August)   desvenlafaxine 50 MG 24 hr tablet Commonly known as:  PRISTIQ Take 1  tablet (50 mg total) daily by mouth.   diclofenac sodium 1 % Gel Commonly known as:  VOLTAREN Apply 4 g topically 4 (four) times daily.   docusate sodium 100 MG capsule Commonly known as:  COLACE Take 1 capsule (100 mg total) by mouth 2 (two) times daily.   DULoxetine 20 MG capsule Commonly known as:  CYMBALTA Take 2 capsules (40 mg total) by mouth at bedtime.   HYDROcodone-acetaminophen 10-325 MG tablet Commonly known as:  NORCO Take 1 tablet by mouth every 6 (six) hours as needed for moderate pain or severe pain.   hydroquinone 4 % cream 1 APPLICATION TOPICALLY DAILY AS NEEDED FOR BLEMISHES.   hydrOXYzine 50 MG capsule Commonly known as:  VISTARIL 2 q hs  1   prn   ketorolac 10 MG tablet Commonly known as:  TORADOL Take 1 tablet (10 mg total) by mouth 2 (two) times daily as needed for severe pain.   lamoTRIgine 150 MG tablet Commonly known as:  LAMICTAL Take 2 tablets (300 mg total) at bedtime by mouth.   levalbuterol 45 MCG/ACT inhaler Commonly known as:  XOPENEX HFA Inhale 1-2 puffs into the lungs every 6 (six) hours as needed for wheezing.   Lysine 1000 MG Tabs Take 2,000-4,000 mg by mouth at bedtime. 2000 mg scheduled at bedtime and patient will take 4000 mg if she has outbreak   neomycin-bacitracin-polymyxin [phone] ointment Apply 1 application topically 4 (four) times daily as needed (for cut/scrapes.).   nicotine 21 mg/24hr patch Commonly known as:  NICODERM CQ - dosed in mg/24 hours Place 1 patch (21 mg total) onto the skin daily.   nitrofurantoin (macrocrystal-monohydrate) 100 MG capsule Commonly known as:  MACROBID Take by mouth.   polyethylene glycol powder powder Commonly known as:  GLYCOLAX/MIRALAX MIX 17 GRAMS (1 CAPFUL) WITH 4-8 OZ OF LIQUID AND TAKE BY MOUTH TWICE DAILY AS NEEDED   predniSONE 20 MG tablet Commonly known as:  DELTASONE Take 1 tablet (20 mg total) daily with breakfast by mouth.   temazepam 30 MG capsule Commonly known  as:  RESTORIL TAKE ONE TO TWO CAPSULES BY MOUTH NIGHTLY AT BEDTIME   tiZANidine 4 MG tablet Commonly known as:  ZANAFLEX Take 2 tablets (8 mg total) by mouth every 6 (six) hours as needed for muscle spasms.   Vitamin D3 10000 units capsule Take 10,000 Units by mouth daily.   zolpidem 10 MG tablet Commonly known as:  AMBIEN Take 1 tablet (10 mg total) by mouth at bedtime.       Allergies:  Allergies  Allergen Reactions  . Albuterol Shortness Of Breath and Other (See Comments)    Makes pt feel jittery/ tacycardic  . Crestor [Rosuvastatin] Other (See Comments)    Joint pain, muscle pain, and hair loss  . Halcion [Triazolam] Other (See Comments)    Dizziness,headaches,bladder problems  . Levaquin [Levofloxacin In D5w] Diarrhea and Itching    Shoulder pain  . Naproxen Sodium Swelling    Patient tolerates in small doses  . Tylenol [Acetaminophen] Swelling    Patient tolerates in small doses  . Cefaclor Other (See Comments)    Doesn't remember---unsure if actually allergic   . Diclofenac Sodium Other (See Comments)    "made very sick"  . Sulfa Antibiotics Itching    Unsure of reaction possibly itching  . Tramadol Itching and Nausea And Vomiting  . Aripiprazole Other (See Comments)    Muscle tension/cramping  . Ibuprofen Swelling    Patient tolerates in small doses    Family History: Family History  Problem Relation Age of Onset  . Cancer Father        Abdomen with mastasis  . Cancer Mother   . Heart disease Mother   . Kidney disease Neg Hx   . Bladder Cancer Neg Hx   . Prostate cancer Neg Hx   . Kidney cancer Neg Hx     Social History:  reports that she quit smoking about 4 years ago. Her smoking use included cigarettes. She quit after 25.00 years of use. she has never used smokeless tobacco. She reports that she does not drink alcohol or use drugs.  ROS:  Physical Exam: LMP 08/23/2014     Constitutional: Well nourished. Alert and oriented, No acute distress. HEENT: [place] AT, moist mucus membranes. Trachea midline, no masses. Cardiovascular: No clubbing, cyanosis, or edema. Respiratory: Normal respiratory effort, no increased work of breathing. Skin: No rashes, bruises or suspicious lesions. Lymph: No cervical or inguinal adenopathy. Neurologic: Grossly intact, no focal deficits, moving all 4 extremities. Psychiatric: Normal mood and affect.  Laboratory Data: Lab Results  Component Value Date   WBC 7.0 07/27/2017   HGB 13.6 07/27/2017   HCT 40.8 07/27/2017   MCV 96.4 07/27/2017   PLT 326.0 07/27/2017    Lab Results  Component Value Date   CREATININE 0.90 07/27/2017    Lab Results  Component Value Date   HGBA1C 6.1 03/18/2017    Lab Results  Component Value Date   TSH 1.14 03/18/2017       Component Value Date/Time   CHOL 316 (H) 03/18/2017 1018   HDL 50.60 03/18/2017 1018   CHOLHDL 6 03/18/2017 1018   VLDL 53.8 (H) 03/18/2017 1018   LDLCALC 223 (H) 11/13/2014 0908    Lab Results  Component Value Date   AST 15 10/10/2016   Lab Results  Component Value Date   ALT 11 (L) 10/10/2016   I have reviewed the labs   PTNS treatment: The needle electrode was inserted into the lower, inner aspect of the patient's right leg. The surface electrode was placed on the inside arch of the foot on the treatment leg. The lead set was connected to the stimulator and the needle electrode clip was connected to the needle electrode. The stimulator that produces an adjustable electrical pulse that travels to the sacral nerve plexus via the tibial nerve was increased to 14 until the patient received a sensory response and a toe flex.     Assessment & Plan:    1. Frequency  Treatment Plan:  The needle electrode was removed without difficulty to the patient.  Patient tolerated the procedure for 30 minutes.  She will return next week for # 10 out of 12 of their weekly  PTNS treatment's  No Follow-up on file.  These notes generated with voice recognition software. I apologize for typographical errors.  Zara Council, [facility] [address], [place], [place] [address] [phone]

## 2017-10-06 ENCOUNTER — Ambulatory Visit: Payer: Self-pay | Admitting: Urology

## 2017-10-06 ENCOUNTER — Ambulatory Visit (INDEPENDENT_AMBULATORY_CARE_PROVIDER_SITE_OTHER): Payer: PPO | Admitting: Primary Care

## 2017-10-06 VITALS — BP 126/80 | HR 78 | Temp 98.6°F | Wt 212.0 lb

## 2017-10-06 DIAGNOSIS — J069 Acute upper respiratory infection, unspecified: Secondary | ICD-10-CM | POA: Diagnosis not present

## 2017-10-06 DIAGNOSIS — E538 Deficiency of other specified B group vitamins: Secondary | ICD-10-CM | POA: Diagnosis not present

## 2017-10-06 MED ORDER — CYANOCOBALAMIN 1000 MCG/ML IJ SOLN
1000.0000 ug | Freq: Once | INTRAMUSCULAR | Status: AC
  Administered 2017-10-06: 1000 ug via INTRAMUSCULAR

## 2017-10-06 MED ORDER — AMOXICILLIN 875 MG PO TABS: 875.0000 mg | ORAL_TABLET | Freq: Two times a day (BID) | ORAL | 0 refills | Status: DC

## 2017-10-06 NOTE — Patient Instructions (Signed)
Start amoxicillin antibiotics if no continued improvement. Take 1 tablet by mouth twice daily for 7 days.  Ensure you are staying hydrated with water.  Continue Tessalon Perles as needed.  Have a safe trip! It was a pleasure to see you today!

## 2017-10-06 NOTE — Progress Notes (Addendum)
Subjective:    Patient ID: Emily Moon, female    DOB: 1961/02/28, 56 y.o.   MRN: [id number]  HPI  [name] is a 56 year old female with a history of multiple sclerosis, bipolar disorder, recurrent UTI, renal stones, sepsis who presents today with a chief complaint of cough. She is also due for her monthly vitamin B 12 injection.  She was evaluated on 09/27/17 with a 6 day history of fatigue, productive cough with yellow sputum, fevers. She was treated with Azithromycin, Tessalon Perles, prednisone, and Xopenex inhaler.   Since her last visit she's completed her course of Azithromycin and Prednisone and is still feeling bad. She's continued to run fevers which are ranging 99-100.3. She returned yesterday and underwent a chest xray which was negative for pneumonia. Her temperature this morning was 98.8. She's been taking Tylenol, Xopenex inhaler, Mucinex, and Tessalon Perles. Overall she's feeling slightly better, but tends to feel worse in the evenings. She will be traveling tomorrow to see her family.  Review of Systems  Constitutional: Positive for chills, fatigue and fever.  HENT: Positive for congestion.   Respiratory: Positive for cough and shortness of breath. Negative for wheezing.   Cardiovascular: Negative for chest pain.  Gastrointestinal: Negative for nausea.       Past Medical History:  Diagnosis Date  . Arthritis    osteo  . Asthma   . Bipolar disorder ([place]) 05/21/14  . Cataracts, bilateral   . DDD (degenerative disc disease), cervical    also back  . Depression   . Dizziness    Positional  . Edema    feet/legs  . Fibromyalgia syndrome   . Fungal infection    Finger nails  . GERD (gastroesophageal reflux disease)   . Gout   . Headache    seasonal allergies  . Heart palpitations   . 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 Hypercholesterolemia   . Multiple sclerosis (HCC)    weakness  . Nephrolithiasis    kidney stones  . Osteoporosis    osteoarthritis  . Pneumonia   . PONV (postoperative nausea and vomiting)    no problem after cataract surgery  . Psoriasis   . Renal stone   . Shortness of breath dyspnea    wheezing  . Sleep apnea 2012   sleep study / slight, no interventions  . Urinary frequency      Social History   Socioeconomic History  . Marital status: Divorced    Spouse name: Not on file  . Number of children: 1  . Years of education: Not on file  . Highest education level: Not on file  Social Needs  . Financial resource strain: Not on file  . Food insecurity - worry: Not on file  . Food insecurity - inability: Not on file  . Transportation needs - medical: Not on file  . Transportation needs - non-medical: Not on file  Occupational History  . 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 Smoking status: Former Smoker    Years: 25.00    Types: Cigarettes    Last attempt to quit: 11/16/2012    Years since quitting: 4.8  . Smokeless tobacco: Never Used  . Tobacco comment: occasional use  Substance and Sexual Activity  . Alcohol use: No    Alcohol/week: 0.0 oz  . Drug use: No  . Sexual activity: No  Other Topics Concern  . Not on file  Social History Narrative  . Not on file    Past Surgical History:  Procedure Laterality Date  . CATARACT EXTRACTION W/PHACO Left 05/21/2015   Procedure: CATARACT EXTRACTION PHACO AND INTRAOCULAR LENS PLACEMENT (IOC);  Surgeon: Birder Robson, MD;  Location: [facility];  Service: Ophthalmology;  Laterality: Left;  [place] 00:35 AP% 22.9 CDE 8.11 fluid pack lot #[id number]  . CATARACT EXTRACTION W/PHACO Right 06/04/2015   Procedure: CATARACT EXTRACTION PHACO AND INTRAOCULAR LENS PLACEMENT (IOC);  Surgeon: Birder Robson, MD;  Location: [facility];  Service: Ophthalmology;  Laterality: Right;  US:00:48 AP%: 10.5 CDE:5.08 Fluid lot #[id number]  . CYSTOSCOPY/URETEROSCOPY/HOLMIUM LASER/STENT PLACEMENT Bilateral 09/22/2016   Procedure:  CYSTOSCOPY/URETEROSCOPY/HOLMIUM LASER/STENT PLACEMENT;  Surgeon: Hollice Espy, MD;  Location: [facility];  Service: Urology;  Laterality: Bilateral;  . EYE SURGERY  2015   tissue biopsy  . FOOT SURGERY  2015  . JOINT REPLACEMENT Left 2013   hip replacement  . LITHOTRIPSY    . PTOSIS REPAIR Bilateral 02/18/2016   Procedure: BILATERAL PTOSIS REPAIR UPPER EYELIDS;  Surgeon: Karle Starch, MD;  Location: [facility];  Service: Ophthalmology;  Laterality: Bilateral;  LEAVE PT EARLY AM  . thumb surgery Right   . TONSILLECTOMY  1973    Family History  Problem Relation Age of Onset  . Cancer Father        Abdomen with mastasis  . Cancer Mother   . Heart disease Mother   . Kidney disease Neg Hx   . Bladder Cancer Neg Hx   . Prostate cancer Neg Hx   . Kidney cancer Neg Hx     Allergies  Allergen Reactions  . Albuterol Shortness Of Breath and Other (See Comments)    Makes pt feel jittery/ tacycardic  . Crestor [Rosuvastatin] Other (See Comments)    Joint pain, muscle pain, and hair loss  . Halcion [Triazolam] Other (See Comments)    Dizziness,headaches,bladder problems  . Levaquin [Levofloxacin In D5w] Diarrhea and Itching    Shoulder pain  . Naproxen Sodium Swelling    Patient tolerates in small doses  . Tylenol [Acetaminophen] Swelling    Patient tolerates in small doses  . Cefaclor Other (See Comments)    Doesn't remember---unsure if actually allergic   . Diclofenac Sodium Other (See Comments)    "made very sick"  . Sulfa Antibiotics Itching    Unsure of reaction possibly itching  . Tramadol Itching and Nausea And Vomiting  . Aripiprazole Other (See Comments)    Muscle tension/cramping  . Ibuprofen Swelling    Patient tolerates in small doses    Current Outpatient Medications on File Prior to Visit  Medication Sig Dispense Refill  . ASPERCREME LIDOCAINE EX Apply 1 application topically 4 (four) times daily as needed (for pain.).     Marland Kitchen aspirin 325 MG tablet Take 325  mg by mouth every 4 (four) hours as needed for headache.     . benzonatate (TESSALON) 100 MG capsule Take 1-2 capsules (100-200 mg total) 3 (three) times daily as needed by mouth for cough. 40 capsule 0  . Biotin 10000 MCG TABS Take 30,000 mcg by mouth daily.     . calcipotriene-betamethasone (TACLONEX) ointment Apply 1 application topically daily as needed (for psorasis).     . Cholecalciferol (VITAMIN D3) 10000 units capsule Take 10,000 Units by mouth daily.    . clobetasol cream (TEMOVATE) 2.33 % Apply 1 application topically 2 (two) times daily. 30 g 2  . cyanocobalamin (,VITAMIN B-12,) 1000 MCG/ML injection Inject  1,000 mcg into the muscle every 30 (thirty) days. For 3 Months (June, July, August)    . desvenlafaxine (PRISTIQ) 50 MG 24 hr tablet Take 1 tablet (50 mg total) daily by mouth. 30 tablet 3  . diclofenac sodium (VOLTAREN) 1 % GEL Apply 4 g topically 4 (four) times daily. (Patient taking differently: Apply 4 g topically 4 (four) times daily as needed (for pain.). ) 500 g 5  . docusate sodium (COLACE) 100 MG capsule Take 1 capsule (100 mg total) by mouth 2 (two) times daily. (Patient taking differently: Take 100 mg by mouth 2 (two) times daily as needed. ) 60 capsule 0  . DULoxetine (CYMBALTA) 20 MG capsule Take 2 capsules (40 mg total) by mouth at bedtime. 60 capsule 5  . HYDROcodone-acetaminophen (NORCO) 10-325 MG tablet Take 1 tablet by mouth every 6 (six) hours as needed for moderate pain or severe pain. 120 tablet 0  . hydroquinone 4 % cream 1 APPLICATION TOPICALLY DAILY AS NEEDED FOR BLEMISHES.  3  . hydrOXYzine (VISTARIL) 50 MG capsule 2 q hs   1   prn 90 capsule 5  . ketorolac (TORADOL) 10 MG tablet Take 1 tablet (10 mg total) by mouth 2 (two) times daily as needed for severe pain. 60 tablet 0  . lamoTRIgine (LAMICTAL) 150 MG tablet Take 2 tablets (300 mg total) at bedtime by mouth. 60 tablet 5  . levalbuterol (XOPENEX HFA) 45 MCG/ACT inhaler Inhale 1-2 puffs into the lungs every  6 (six) hours as needed for wheezing. 1 Inhaler 5  . Lysine 1000 MG TABS Take 2,000-4,000 mg by mouth at bedtime. 2000 mg scheduled at bedtime and patient will take 4000 mg if she has outbreak    . neomycin-bacitracin-polymyxin (NEOSPORIN) [phone] ointment Apply 1 application topically 4 (four) times daily as needed (for cut/scrapes.).    Marland Kitchen nicotine (NICODERM CQ - DOSED IN MG/24 HOURS) 21 mg/24hr patch Place 1 patch (21 mg total) onto the skin daily. 28 patch 3  . nitrofurantoin, macrocrystal-monohydrate, (MACROBID) 100 MG capsule Take by mouth.    . phenazopyridine (AZO-TABS) 95 MG tablet Take 95 mg by mouth 3 (three) times daily as needed for pain.    . polyethylene glycol powder (GLYCOLAX/MIRALAX) powder MIX 17 GRAMS (1 CAPFUL) WITH 4-8 OZ OF LIQUID AND TAKE BY MOUTH TWICE DAILY AS NEEDED (Patient taking differently: MIX 17 GRAMS (1 CAPFUL) WITH 4-8 OZ OF LIQUID AND TAKE BY MOUTH TWICE DAILY AS NEEDED FOR CONSTIPATION) 527 g 0  . temazepam (RESTORIL) 30 MG capsule TAKE ONE TO TWO CAPSULES BY MOUTH NIGHTLY AT BEDTIME 60 capsule 4  . tiZANidine (ZANAFLEX) 4 MG tablet Take 2 tablets (8 mg total) by mouth every 6 (six) hours as needed for muscle spasms. 720 tablet 3  . zolpidem (AMBIEN) 10 MG tablet Take 1 tablet (10 mg total) by mouth at bedtime. 135 tablet 0   No current facility-administered medications on file prior to visit.     BP 126/80   Pulse 78   Temp 98.6 F (37 C) (Oral)   Wt 212 lb (96.2 kg)   LMP 08/23/2014   SpO2 99%   BMI 31.31 kg/m    Objective:   Physical Exam  Constitutional: She appears well-nourished.  HENT:  Right Ear: Tympanic membrane and ear canal normal.  Left Ear: Tympanic membrane and ear canal normal.  Nose: Right sinus exhibits no maxillary sinus tenderness and no frontal sinus tenderness. Left sinus exhibits no maxillary sinus tenderness and no  frontal sinus tenderness.  Mouth/Throat: Oropharynx is clear and moist.  Eyes: Conjunctivae are normal.    Neck: Neck supple.  Cardiovascular: Normal rate and regular rhythm.  Pulmonary/Chest: Effort normal. She has no decreased breath sounds. She has wheezes in the left upper field.  Congested cough during exam  Lymphadenopathy:    She has no cervical adenopathy.  Skin: Skin is warm and dry.          Assessment & Plan:  Acute Upper Respiratory Infection:  2+ week history of URI symptoms, no improvement after Zpak and Prednisone.  Exam today with mild wheezing, otherwise clear lungs. Chest xray negative for pneumonia.  This could still be viral given no improvement with antibiotics. Would be reasonable to trial Amoxil course given history. Discussed to continue [employer] as needed. Fluids, rest, follow up PRN.  Sheral Flow, NP

## 2017-10-11 NOTE — Assessment & Plan Note (Signed)
Due, administered during visit on 10/06/17.

## 2017-10-12 NOTE — Progress Notes (Signed)
Chief Complaint:  Chief Complaint  Patient presents with  . PTNS     HPI: Patient is a 56 year old Caucasian female with multiple sclerosis with a history of nephrolithiasis and urinary frequency who presents today to begin 12 weekly treatments of PTNS.  This will be # 9/12.           Anticholinergics - patient failed Vesicare     Myrbetriq was effective, but she found it cost prohibitive      Contraindications present for PTNS      Pacemaker - NO      Implantable defibrillator - NO      History of abnormal bleeding - NO      History of neuropathies or nerve damage - NO  Discussed with patient possible complications of procedure, such as discomfort, bleeding at insertion/stimulation site, procedure consent signed  Patient goals:      Patient is wanting to have the same control over her urinary symptoms as she experienced with the Myrbetriq.    Reemphasized that most patient's will see benefit by the 8th week of treatment, but about 10 to 20% of patient may be late responders.  If they happen to be late responders, it is important to continue with the therapy beyond the 12 weekly treatments as many of those patient find benefit.  Today, she is experiencing frequency, urgency, nocturia, intermittency, hesitancy and straining to urinate.   She is experiencing 14-16 daytime voids (improved), 0  night time voids (stable), a mild urgency (improved) and 0 leakage (stable).   She is not having dysuria, gross hematuria suprapubic pain. She denies fever, chills, nausea and vomiting.   PMH: Past Medical History:  Diagnosis Date  . Arthritis    osteo  . Asthma   . Bipolar disorder ([place]) 05/21/14  . Cataracts, bilateral   . DDD (degenerative disc disease), cervical    also back  . Depression   . Dizziness    Positional  . Edema    feet/legs  . Fibromyalgia syndrome   . Fungal infection    Finger nails  . GERD (gastroesophageal reflux disease)   . Gout   . Headache    seasonal allergies  . Heart palpitations   . 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 Hypercholesterolemia   . Multiple sclerosis (HCC)    weakness  . Nephrolithiasis    kidney stones  . Osteoporosis    osteoarthritis  . Pneumonia   . PONV (postoperative nausea and vomiting)    no problem after cataract surgery  . Psoriasis   . Renal stone   . Shortness of breath dyspnea    wheezing  . Sleep apnea 2012   sleep study / slight, no interventions  . Urinary frequency     Surgical History: Past Surgical History:  Procedure Laterality Date  . CATARACT EXTRACTION W/PHACO Left 05/21/2015   Procedure: CATARACT EXTRACTION PHACO AND INTRAOCULAR LENS PLACEMENT (IOC);  Surgeon: Birder Robson, MD;  Location: [facility];  Service: Ophthalmology;  Laterality: Left;  [place] 00:35 AP% 22.9 CDE 8.11 fluid pack lot #[id number]  . CATARACT EXTRACTION W/PHACO Right 06/04/2015   Procedure: CATARACT EXTRACTION PHACO AND INTRAOCULAR LENS PLACEMENT (IOC);  Surgeon: Birder Robson, MD;  Location: [facility];  Service: Ophthalmology;  Laterality: Right;  US:00:48 AP%: 10.5 CDE:5.08 Fluid lot #[id number]  . CYSTOSCOPY/URETEROSCOPY/HOLMIUM LASER/STENT PLACEMENT Bilateral 09/22/2016   Procedure: CYSTOSCOPY/URETEROSCOPY/HOLMIUM LASER/STENT PLACEMENT;  Surgeon: Hollice Espy, MD;  Location: [facility];  Service: Urology;  Laterality: Bilateral;  .  EYE SURGERY  2015   tissue biopsy  . FOOT SURGERY  2015  . JOINT REPLACEMENT Left 2013   hip replacement  . LITHOTRIPSY    . PTOSIS REPAIR Bilateral 02/18/2016   Procedure: BILATERAL PTOSIS REPAIR UPPER EYELIDS;  Surgeon: Karle Starch, MD;  Location: [place];  Service: Ophthalmology;  Laterality: Bilateral;  LEAVE PT EARLY AM  . thumb surgery Right   . TONSILLECTOMY  1973    Home Medications:  Allergies as of 10/13/2017      Reactions   Albuterol Shortness Of Breath, Other (See Comments)   Makes pt feel jittery/ tacycardic   Crestor [rosuvastatin] Other  (See Comments)   Joint pain, muscle pain, and hair loss   Halcion [triazolam] Other (See Comments)   Dizziness,headaches,bladder problems   Levaquin [levofloxacin In D5w] Diarrhea, Itching   Shoulder pain   Naproxen Sodium Swelling   Patient tolerates in small doses   Tylenol [acetaminophen] Swelling   Patient tolerates in small doses   Cefaclor Other (See Comments)   Doesn't remember---unsure if actually allergic    Diclofenac Sodium Other (See Comments)   "made very sick"   Sulfa Antibiotics Itching   Unsure of reaction possibly itching   Tramadol Itching, Nausea And Vomiting   Aripiprazole Other (See Comments)   Muscle tension/cramping   Ibuprofen Swelling   Patient tolerates in small doses      Medication List        Accurate as of 10/13/17 11:59 PM. Always use your most recent med list.          amoxicillin 875 MG tablet Commonly known as:  AMOXIL Take 1 tablet (875 mg total) by mouth 2 (two) times daily.   ASPERCREME LIDOCAINE EX Apply 1 application topically 4 (four) times daily as needed (for pain.).   aspirin 325 MG tablet Take 325 mg by mouth every 4 (four) hours as needed for headache.   AZO-TABS 95 MG tablet Generic drug:  phenazopyridine Take 95 mg by mouth 3 (three) times daily as needed for pain.   benzonatate 100 MG capsule Commonly known as:  TESSALON Take 1-2 capsules (100-200 mg total) 3 (three) times daily as needed by mouth for cough.   Biotin 10000 MCG Tabs Take 30,000 mcg by mouth daily.   calcipotriene-betamethasone ointment Commonly known as:  TACLONEX Apply 1 application topically daily as needed (for psorasis).   clobetasol cream 0.05 % Commonly known as:  TEMOVATE Apply 1 application topically 2 (two) times daily.   cyanocobalamin 1000 MCG/ML injection Commonly known as:  (VITAMIN B-12) Inject 1,000 mcg into the muscle every 30 (thirty) days. For 3 Months (June, July, August)   desvenlafaxine 50 MG 24 hr tablet Commonly  known as:  PRISTIQ Take 1 tablet (50 mg total) daily by mouth.   diclofenac sodium 1 % Gel Commonly known as:  VOLTAREN Apply 4 g topically 4 (four) times daily.   docusate sodium 100 MG capsule Commonly known as:  COLACE Take 1 capsule (100 mg total) by mouth 2 (two) times daily.   DULoxetine 20 MG capsule Commonly known as:  CYMBALTA Take 2 capsules (40 mg total) by mouth at bedtime.   HYDROcodone-acetaminophen 10-325 MG tablet Commonly known as:  NORCO Take 1 tablet by mouth every 6 (six) hours as needed for moderate pain or severe pain.   HYDROcodone-acetaminophen 10-325 MG tablet Commonly known as:  NORCO Take 1 tablet by mouth every 6 (six) hours as needed for moderate pain or  severe pain.   HYDROcodone-acetaminophen 10-325 MG tablet Commonly known as:  NORCO Take 1 tablet by mouth every 6 (six) hours as needed for moderate pain or severe pain.   hydroquinone 4 % cream 1 APPLICATION TOPICALLY DAILY AS NEEDED FOR BLEMISHES.   hydrOXYzine 50 MG capsule Commonly known as:  VISTARIL 2 q hs   1   prn   ketorolac 10 MG tablet Commonly known as:  TORADOL Take 1 tablet (10 mg total) by mouth 2 (two) times daily as needed for severe pain.   lamoTRIgine 150 MG tablet Commonly known as:  LAMICTAL Take 2 tablets (300 mg total) at bedtime by mouth.   levalbuterol 45 MCG/ACT inhaler Commonly known as:  XOPENEX HFA Inhale 1-2 puffs into the lungs every 6 (six) hours as needed for wheezing.   Lysine 1000 MG Tabs Take 2,000-4,000 mg by mouth at bedtime. 2000 mg scheduled at bedtime and patient will take 4000 mg if she has outbreak   neomycin-bacitracin-polymyxin [phone] ointment Apply 1 application topically 4 (four) times daily as needed (for cut/scrapes.).   nicotine 21 mg/24hr patch Commonly known as:  NICODERM CQ - dosed in mg/24 hours Place 1 patch (21 mg total) onto the skin daily.   nitrofurantoin (macrocrystal-monohydrate) 100 MG capsule Commonly known as:   MACROBID Take by mouth.   polyethylene glycol powder powder Commonly known as:  GLYCOLAX/MIRALAX MIX 17 GRAMS (1 CAPFUL) WITH 4-8 OZ OF LIQUID AND TAKE BY MOUTH TWICE DAILY AS NEEDED   temazepam 30 MG capsule Commonly known as:  RESTORIL TAKE ONE TO TWO CAPSULES BY MOUTH NIGHTLY AT BEDTIME   tiZANidine 4 MG tablet Commonly known as:  ZANAFLEX Take 2 tablets (8 mg total) by mouth every 6 (six) hours as needed for muscle spasms.   Vitamin D3 10000 units capsule Take 10,000 Units by mouth daily.   zolpidem 10 MG tablet Commonly known as:  AMBIEN Take 1 tablet (10 mg total) by mouth at bedtime.       Allergies:  Allergies  Allergen Reactions  . Albuterol Shortness Of Breath and Other (See Comments)    Makes pt feel jittery/ tacycardic  . Crestor [Rosuvastatin] Other (See Comments)    Joint pain, muscle pain, and hair loss  . Halcion [Triazolam] Other (See Comments)    Dizziness,headaches,bladder problems  . Levaquin [Levofloxacin In D5w] Diarrhea and Itching    Shoulder pain  . Naproxen Sodium Swelling    Patient tolerates in small doses  . Tylenol [Acetaminophen] Swelling    Patient tolerates in small doses  . Cefaclor Other (See Comments)    Doesn't remember---unsure if actually allergic   . Diclofenac Sodium Other (See Comments)    "made very sick"  . Sulfa Antibiotics Itching    Unsure of reaction possibly itching  . Tramadol Itching and Nausea And Vomiting  . Aripiprazole Other (See Comments)    Muscle tension/cramping  . Ibuprofen Swelling    Patient tolerates in small doses    Family History: Family History  Problem Relation Age of Onset  . Cancer Father        Abdomen with mastasis  . Cancer Mother   . Heart disease Mother   . Kidney disease Neg Hx   . Bladder Cancer Neg Hx   . Prostate cancer Neg Hx   . Kidney cancer Neg Hx     Social History:  reports that she quit smoking about 4 years ago. Her smoking use included cigarettes. She quit after  25.00 years of use. she has never used smokeless tobacco. She reports that she does not drink alcohol or use drugs.  ROS: UROLOGY Frequent Urination?: Yes Hard to postpone urination?: Yes Burning/pain with urination?: No Get up at night to urinate?: No Leakage of urine?: Yes Urine stream starts and stops?: No Trouble starting stream?: Yes Do you have to strain to urinate?: Yes Blood in urine?: No Urinary tract infection?: No Sexually transmitted disease?: No Injury to kidneys or bladder?: No Painful intercourse?: No Weak stream?: No Currently pregnant?: No Vaginal bleeding?: No Last menstrual period?: n  Gastrointestinal Nausea?: No Vomiting?: No Indigestion/heartburn?: Yes Diarrhea?: No Constipation?: No  Constitutional Fever: No Night sweats?: No Weight loss?: No Fatigue?: Yes  Skin Skin rash/lesions?: No Itching?: No  Eyes Blurred vision?: No Double vision?: No  Ears/Nose/Throat Sore throat?: No Sinus problems?: No  Hematologic/Lymphatic Swollen glands?: No Easy bruising?: No  Cardiovascular Leg swelling?: No Chest pain?: No  Respiratory Cough?: No Shortness of breath?: Yes  Endocrine Excessive thirst?: No  Musculoskeletal Back pain?: Yes Joint pain?: Yes  Neurological Headaches?: No Dizziness?: No  Psychologic Depression?: Yes Anxiety?: No   Physical Exam: BP 138/84   Pulse 91   Ht 5\' 9"  (1.753 m)   Wt 207 lb 8 oz (94.1 kg)   LMP 08/23/2014   BMI 30.64 kg/m   Constitutional: Well nourished. Alert and oriented, No acute distress. HEENT: [place] AT, moist mucus membranes. Trachea midline, no masses. Cardiovascular: No clubbing, cyanosis, or edema. Respiratory: Normal respiratory effort, no increased work of breathing. Skin: No rashes, bruises or suspicious lesions. Lymph: No cervical or inguinal adenopathy. Neurologic: Grossly intact, no focal deficits, moving all 4 extremities. Psychiatric: Normal mood and affect.  Laboratory  Data: Lab Results  Component Value Date   WBC 7.0 07/27/2017   HGB 13.6 07/27/2017   HCT 40.8 07/27/2017   MCV 96.4 07/27/2017   PLT 326.0 07/27/2017    Lab Results  Component Value Date   CREATININE 0.90 07/27/2017    Lab Results  Component Value Date   HGBA1C 6.1 03/18/2017    Lab Results  Component Value Date   TSH 1.14 03/18/2017       Component Value Date/Time   CHOL 316 (H) 03/18/2017 1018   HDL 50.60 03/18/2017 1018   CHOLHDL 6 03/18/2017 1018   VLDL 53.8 (H) 03/18/2017 1018   LDLCALC 223 (H) 11/13/2014 0908    Lab Results  Component Value Date   AST 15 10/10/2016   Lab Results  Component Value Date   ALT 11 (L) 10/10/2016   I have reviewed the labs   PTNS treatment: The needle electrode was inserted into the lower, inner aspect of the patient's right leg. The surface electrode was placed on the inside arch of the foot on the treatment leg. The lead set was connected to the stimulator and the needle electrode clip was connected to the needle electrode. The stimulator that produces an adjustable electrical pulse that travels to the sacral nerve plexus via the tibial nerve was increased to 16 until the patient received a sensory response and a toe flex.     Assessment & Plan:    1. Frequency  Treatment Plan:  The needle electrode was removed without difficulty to the patient.  Patient tolerated the procedure for 30 minutes.  She will return next week for # 10 out of 12 of their weekly PTNS treatment's  Return in about 1 week (around 10/20/2017) for # 10 PTNS.  These notes generated with voice recognition software. I apologize for typographical errors.  Zara Council, [facility] [address], [place], [place] [address] [phone]

## 2017-10-13 ENCOUNTER — Encounter: Payer: Self-pay | Admitting: Urology

## 2017-10-13 ENCOUNTER — Encounter: Payer: Self-pay | Admitting: Primary Care

## 2017-10-13 ENCOUNTER — Telehealth: Payer: Self-pay | Admitting: Primary Care

## 2017-10-13 ENCOUNTER — Ambulatory Visit: Payer: PPO | Admitting: Urology

## 2017-10-13 VITALS — BP 138/84 | HR 91 | Ht 69.0 in | Wt 207.5 lb

## 2017-10-13 DIAGNOSIS — R350 Frequency of micturition: Secondary | ICD-10-CM

## 2017-10-13 DIAGNOSIS — M1990 Unspecified osteoarthritis, unspecified site: Secondary | ICD-10-CM

## 2017-10-13 NOTE — Progress Notes (Signed)
PTNS  Session # 9  Health & Social Factors: No Change Caffeine: 0-1 Alcohol: 0 Daytime voids #per day: 14-16 Night-time voids #per night: 0-1 Urgency: Mild Incontinence Episodes #per day: 0 Ankle used: Right Treatment Setting: 16 Feeling/ Response: Sensory and Toe Flex  Preformed By: Zara Council, PA  Assistant: Reece Packer, RN

## 2017-10-13 NOTE — Telephone Encounter (Signed)
Copied from [place] [id number]. Topic: Quick Communication - See Telephone Encounter >> Oct 13, 2017  8:58 AM Ether Griffins B wrote: CRM for notification. See Telephone encounter for:  Pt would like to know if she can pick up her refill  for hydrocodone on Friday or Monday. Pt tried to send a message via my chart and its telling pt kate clark np unavailable. Pt would like clark to call her and speak with her about her sister if she's available to call  10/13/17.

## 2017-10-13 NOTE — Telephone Encounter (Signed)
Please call patient's pharmacy to see if her hydrocodone was filled in November as she was provided a prescription in late October.  PMP aware does not show evidence of a fill date in November.

## 2017-10-14 MED ORDER — HYDROCODONE-ACETAMINOPHEN 10-325 MG PO TABS: 1.0000 | ORAL_TABLET | Freq: Four times a day (QID) | ORAL | 0 refills | Status: DC | PRN

## 2017-10-14 NOTE — Telephone Encounter (Signed)
Called and spoken to [facility] pharmacy. HYDROcodone-acetaminophen (NORCO) 10-325 MG tablet was last filled on 09/21/2017 for 30 tablets.

## 2017-10-14 NOTE — Telephone Encounter (Signed)
Please clarify, was this for 30 tablets or 30 days? She typically gets 120 tablets.

## 2017-10-15 NOTE — Telephone Encounter (Signed)
Sorry. It supposed to be 30 days. Patient had 120 tablets on her last refill.

## 2017-10-15 NOTE — Telephone Encounter (Signed)
Noted  

## 2017-10-17 ENCOUNTER — Encounter: Payer: Self-pay | Admitting: Primary Care

## 2017-10-19 ENCOUNTER — Encounter: Payer: Self-pay | Admitting: Primary Care

## 2017-10-19 NOTE — Progress Notes (Signed)
Chief Complaint:  Chief Complaint  Patient presents with  . Urinary Frequency    HPI: Patient is a 56 year old Caucasian female with multiple sclerosis with a history of nephrolithiasis and urinary frequency who presents today to begin 12 weekly treatments of PTNS.  This will be # 10/12.           Anticholinergics - patient failed Vesicare     Myrbetriq was effective, but she found it cost prohibitive      Contraindications present for PTNS      Pacemaker - NO      Implantable defibrillator - NO      History of abnormal bleeding - NO      History of neuropathies or nerve damage - NO  Discussed with patient possible complications of procedure, such as discomfort, bleeding at insertion/stimulation site, procedure consent signed  Patient goals:      Patient is wanting to have the same control over her urinary symptoms as she experienced with the Myrbetriq.    Reemphasized that most patient's will see benefit by the 8th week of treatment, but about 10 to 20% of patient may be late responders.  If they happen to be late responders, it is important to continue with the therapy beyond the 12 weekly treatments as many of those patient find benefit.  Today, she is experiencing frequency, intermittency and hesitancy.   She is experiencing 15-18 daytime voids (stable), 0 night time voids (stable), a strong urgency (worsened) and 0 leakage (stable).   She is not having dysuria, gross hematuria suprapubic pain. She denies fever, chills, nausea and vomiting.   PMH: Past Medical History:  Diagnosis Date  . Arthritis    osteo  . Asthma   . Bipolar disorder ([place]) 05/21/14  . Cataracts, bilateral   . DDD (degenerative disc disease), cervical    also back  . Depression   . Dizziness    Positional  . Edema    feet/legs  . Fibromyalgia syndrome   . Fungal infection    Finger nails  . GERD (gastroesophageal reflux disease)   . Gout   . Headache    seasonal allergies  . Heart  palpitations   . 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 Hypercholesterolemia   . Multiple sclerosis (HCC)    weakness  . Nephrolithiasis    kidney stones  . Osteoporosis    osteoarthritis  . Pneumonia   . PONV (postoperative nausea and vomiting)    no problem after cataract surgery  . Psoriasis   . Renal stone   . Shortness of breath dyspnea    wheezing  . Sleep apnea 2012   sleep study / slight, no interventions  . Urinary frequency     Surgical History: Past Surgical History:  Procedure Laterality Date  . CATARACT EXTRACTION W/PHACO Left 05/21/2015   Procedure: CATARACT EXTRACTION PHACO AND INTRAOCULAR LENS PLACEMENT (IOC);  Surgeon: Birder Robson, MD;  Location: [facility];  Service: Ophthalmology;  Laterality: Left;  [place] 00:35 AP% 22.9 CDE 8.11 fluid pack lot #[id number]  . CATARACT EXTRACTION W/PHACO Right 06/04/2015   Procedure: CATARACT EXTRACTION PHACO AND INTRAOCULAR LENS PLACEMENT (IOC);  Surgeon: Birder Robson, MD;  Location: [facility];  Service: Ophthalmology;  Laterality: Right;  US:00:48 AP%: 10.5 CDE:5.08 Fluid lot #[id number]  . CYSTOSCOPY/URETEROSCOPY/HOLMIUM LASER/STENT PLACEMENT Bilateral 09/22/2016   Procedure: CYSTOSCOPY/URETEROSCOPY/HOLMIUM LASER/STENT PLACEMENT;  Surgeon: Hollice Espy, MD;  Location: [facility];  Service: Urology;  Laterality: Bilateral;  . EYE SURGERY  2015  tissue biopsy  . FOOT SURGERY  2015  . JOINT REPLACEMENT Left 2013   hip replacement  . LITHOTRIPSY    . PTOSIS REPAIR Bilateral 02/18/2016   Procedure: BILATERAL PTOSIS REPAIR UPPER EYELIDS;  Surgeon: Karle Starch, MD;  Location: [facility];  Service: Ophthalmology;  Laterality: Bilateral;  LEAVE PT EARLY AM  . thumb surgery Right   . TONSILLECTOMY  1973    Home Medications:  Allergies as of 10/20/2017      Reactions   Albuterol Shortness Of Breath, Other (See Comments)   Makes pt feel jittery/ tacycardic   Crestor [rosuvastatin] Other (See Comments)   Joint pain,  muscle pain, and hair loss   Halcion [triazolam] Other (See Comments)   Dizziness,headaches,bladder problems   Levaquin [levofloxacin In D5w] Diarrhea, Itching   Shoulder pain   Naproxen Sodium Swelling   Patient tolerates in small doses   Tylenol [acetaminophen] Swelling   Patient tolerates in small doses   Cefaclor Other (See Comments)   Doesn't remember---unsure if actually allergic    Diclofenac Sodium Other (See Comments)   "made very sick"   Sulfa Antibiotics Itching   Unsure of reaction possibly itching   Tramadol Itching, Nausea And Vomiting   Aripiprazole Other (See Comments)   Muscle tension/cramping   Ibuprofen Swelling   Patient tolerates in small doses      Medication List        Accurate as of 10/20/17 11:59 PM. Always use your most recent med list.          amoxicillin 875 MG tablet Commonly known as:  AMOXIL Take 1 tablet (875 mg total) by mouth 2 (two) times daily.   ASPERCREME LIDOCAINE EX Apply 1 application topically 4 (four) times daily as needed (for pain.).   aspirin 325 MG tablet Take 325 mg by mouth every 4 (four) hours as needed for headache.   AZO-TABS 95 MG tablet Generic drug:  phenazopyridine Take 95 mg by mouth 3 (three) times daily as needed for pain.   benzonatate 100 MG capsule Commonly known as:  TESSALON Take 1-2 capsules (100-200 mg total) 3 (three) times daily as needed by mouth for cough.   Biotin 10000 MCG Tabs Take 30,000 mcg by mouth daily.   calcipotriene-betamethasone ointment Commonly known as:  TACLONEX Apply 1 application topically daily as needed (for psorasis).   clobetasol cream 0.05 % Commonly known as:  TEMOVATE Apply 1 application topically 2 (two) times daily.   cyanocobalamin 1000 MCG/ML injection Commonly known as:  (VITAMIN B-12) Inject 1,000 mcg into the muscle every 30 (thirty) days. For 3 Months (June, July, August)   desvenlafaxine 50 MG 24 hr tablet Commonly known as:  PRISTIQ Take 1 tablet  (50 mg total) daily by mouth.   diclofenac sodium 1 % Gel Commonly known as:  VOLTAREN Apply 4 g topically 4 (four) times daily.   docusate sodium 100 MG capsule Commonly known as:  COLACE Take 1 capsule (100 mg total) by mouth 2 (two) times daily.   DULoxetine 20 MG capsule Commonly known as:  CYMBALTA Take 2 capsules (40 mg total) by mouth at bedtime.   HYDROcodone-acetaminophen 10-325 MG tablet Commonly known as:  NORCO Take 1 tablet by mouth every 6 (six) hours as needed for moderate pain or severe pain.   HYDROcodone-acetaminophen 10-325 MG tablet Commonly known as:  NORCO Take 1 tablet by mouth every 6 (six) hours as needed for moderate pain or severe pain.   HYDROcodone-acetaminophen 10-325  MG tablet Commonly known as:  NORCO Take 1 tablet by mouth every 6 (six) hours as needed for moderate pain or severe pain.   hydroquinone 4 % cream 1 APPLICATION TOPICALLY DAILY AS NEEDED FOR BLEMISHES.   hydrOXYzine 50 MG capsule Commonly known as:  VISTARIL 2 q hs   1   prn   ketorolac 10 MG tablet Commonly known as:  TORADOL Take 1 tablet (10 mg total) by mouth 2 (two) times daily as needed for severe pain.   lamoTRIgine 150 MG tablet Commonly known as:  LAMICTAL Take 2 tablets (300 mg total) at bedtime by mouth.   levalbuterol 45 MCG/ACT inhaler Commonly known as:  XOPENEX HFA Inhale 1-2 puffs into the lungs every 6 (six) hours as needed for wheezing.   Lysine 1000 MG Tabs Take 2,000-4,000 mg by mouth at bedtime. 2000 mg scheduled at bedtime and patient will take 4000 mg if she has outbreak   neomycin-bacitracin-polymyxin [phone] ointment Apply 1 application topically 4 (four) times daily as needed (for cut/scrapes.).   nicotine 21 mg/24hr patch Commonly known as:  NICODERM CQ - dosed in mg/24 hours Place 1 patch (21 mg total) onto the skin daily.   nitrofurantoin (macrocrystal-monohydrate) 100 MG capsule Commonly known as:  MACROBID Take by mouth.     polyethylene glycol powder powder Commonly known as:  GLYCOLAX/MIRALAX MIX 17 GRAMS (1 CAPFUL) WITH 4-8 OZ OF LIQUID AND TAKE BY MOUTH TWICE DAILY AS NEEDED   temazepam 30 MG capsule Commonly known as:  RESTORIL TAKE ONE TO TWO CAPSULES BY MOUTH NIGHTLY AT BEDTIME   tiZANidine 4 MG tablet Commonly known as:  ZANAFLEX Take 2 tablets (8 mg total) by mouth every 6 (six) hours as needed for muscle spasms.   Vitamin D3 10000 units capsule Take 10,000 Units by mouth daily.   zolpidem 10 MG tablet Commonly known as:  AMBIEN Take 1 tablet (10 mg total) by mouth at bedtime.       Allergies:  Allergies  Allergen Reactions  . Albuterol Shortness Of Breath and Other (See Comments)    Makes pt feel jittery/ tacycardic  . Crestor [Rosuvastatin] Other (See Comments)    Joint pain, muscle pain, and hair loss  . Halcion [Triazolam] Other (See Comments)    Dizziness,headaches,bladder problems  . Levaquin [Levofloxacin In D5w] Diarrhea and Itching    Shoulder pain  . Naproxen Sodium Swelling    Patient tolerates in small doses  . Tylenol [Acetaminophen] Swelling    Patient tolerates in small doses  . Cefaclor Other (See Comments)    Doesn't remember---unsure if actually allergic   . Diclofenac Sodium Other (See Comments)    "made very sick"  . Sulfa Antibiotics Itching    Unsure of reaction possibly itching  . Tramadol Itching and Nausea And Vomiting  . Aripiprazole Other (See Comments)    Muscle tension/cramping  . Ibuprofen Swelling    Patient tolerates in small doses    Family History: Family History  Problem Relation Age of Onset  . Cancer Father        Abdomen with mastasis  . Cancer Mother   . Heart disease Mother   . Kidney disease Neg Hx   . Bladder Cancer Neg Hx   . Prostate cancer Neg Hx   . Kidney cancer Neg Hx     Social History:  reports that she quit smoking about 4 years ago. Her smoking use included cigarettes. She quit after 25.00 years of use. she has  never used smokeless tobacco. She reports that she does not drink alcohol or use drugs.  ROS: UROLOGY Frequent Urination?: Yes Hard to postpone urination?: No Burning/pain with urination?: No Get up at night to urinate?: No Leakage of urine?: No Urine stream starts and stops?: No Trouble starting stream?: Yes Do you have to strain to urinate?: Yes Blood in urine?: No Urinary tract infection?: No Sexually transmitted disease?: No Injury to kidneys or bladder?: No Painful intercourse?: No Weak stream?: No Currently pregnant?: No Vaginal bleeding?: No Last menstrual period?: n  Gastrointestinal Nausea?: No Vomiting?: No Indigestion/heartburn?: Yes Diarrhea?: No Constipation?: No  Constitutional Fever: Yes Night sweats?: No Weight loss?: No Fatigue?: No  Skin Skin rash/lesions?: No Itching?: No  Eyes Blurred vision?: No Double vision?: No  Ears/Nose/Throat Sore throat?: No Sinus problems?: No  Hematologic/Lymphatic Swollen glands?: No Easy bruising?: No  Cardiovascular Leg swelling?: No Chest pain?: No  Respiratory Cough?: No Shortness of breath?: No  Endocrine Excessive thirst?: No  Musculoskeletal Back pain?: Yes Joint pain?: Yes  Neurological Headaches?: No Dizziness?: No  Psychologic Depression?: Yes Anxiety?: Yes   Physical Exam: BP 129/79   Pulse (!) 112   Ht 5\' 9"  (1.753 m)   Wt 207 lb 12.8 oz (94.3 kg)   LMP 08/23/2014   BMI 30.69 kg/m   Constitutional: Well nourished. Alert and oriented, No acute distress. HEENT: [place] AT, moist mucus membranes. Trachea midline, no masses. Cardiovascular: No clubbing, cyanosis, or edema. Respiratory: Normal respiratory effort, no increased work of breathing. Skin: No rashes, bruises or suspicious lesions. Lymph: No cervical or inguinal adenopathy. Neurologic: Grossly intact, no focal deficits, moving all 4 extremities. Psychiatric: Normal mood and affect.  Laboratory Data: Lab Results    Component Value Date   WBC 7.0 07/27/2017   HGB 13.6 07/27/2017   HCT 40.8 07/27/2017   MCV 96.4 07/27/2017   PLT 326.0 07/27/2017    Lab Results  Component Value Date   CREATININE 0.90 07/27/2017    Lab Results  Component Value Date   HGBA1C 6.1 03/18/2017    Lab Results  Component Value Date   TSH 1.14 03/18/2017       Component Value Date/Time   CHOL 316 (H) 03/18/2017 1018   HDL 50.60 03/18/2017 1018   CHOLHDL 6 03/18/2017 1018   VLDL 53.8 (H) 03/18/2017 1018   LDLCALC 223 (H) 11/13/2014 0908    Lab Results  Component Value Date   AST 15 10/10/2016   Lab Results  Component Value Date   ALT 11 (L) 10/10/2016   I have reviewed the labs   PTNS treatment: The needle electrode was inserted into the lower, inner aspect of the patient's right leg. The surface electrode was placed on the inside arch of the foot on the treatment leg. The lead set was connected to the stimulator and the needle electrode clip was connected to the needle electrode. The stimulator that produces an adjustable electrical pulse that travels to the sacral nerve plexus via the tibial nerve was increased to 13 until the patient received a sensory response and a toe flex.     Assessment & Plan:    1. Frequency  Treatment Plan:  The needle electrode was removed without difficulty to the patient.  Patient tolerated the procedure for 30 minutes.  She will return next week for # 11 out of 12 of their weekly PTNS treatment's  Return in about 1 week (around 10/27/2017) for # 11/12 PTNS.  These notes generated with  voice recognition software. I apologize for typographical errors.  Zara Council, [facility] [address] [place], [place] [address] [phone]

## 2017-10-20 ENCOUNTER — Ambulatory Visit: Payer: PPO | Admitting: Urology

## 2017-10-20 ENCOUNTER — Encounter: Payer: Self-pay | Admitting: Urology

## 2017-10-20 VITALS — BP 129/79 | HR 112 | Ht 69.0 in | Wt 207.8 lb

## 2017-10-20 DIAGNOSIS — R350 Frequency of micturition: Secondary | ICD-10-CM

## 2017-10-20 NOTE — Progress Notes (Signed)
PTNS  Session # 10  Health & Social Factors: No Change Caffeine: 0-1 Alcohol: 0 Daytime voids #per day: 15-18 Night-time voids #per night: 0 Urgency: Strong Incontinence Episodes #per day: 0 Ankle used: Right Treatment Setting: 13 Feeling/ Response: Sensory and Toe Flex  Performed By: Zara Council, PAC  Assistant: Reece Packer, RN

## 2017-10-25 NOTE — Progress Notes (Deleted)
Chief Complaint:  No chief complaint on file.   HPI: Patient is a 56 year old Caucasian female with multiple sclerosis with a history of nephrolithiasis and urinary frequency who presents today to begin 12 weekly treatments of PTNS.  This will be # 11/12.           Anticholinergics - patient failed Vesicare     Myrbetriq was effective, but she found it cost prohibitive      Contraindications present for PTNS      Pacemaker - NO      Implantable defibrillator - NO      History of abnormal bleeding - NO      History of neuropathies or nerve damage - NO  Discussed with patient possible complications of procedure, such as discomfort, bleeding at insertion/stimulation site, procedure consent signed  Patient goals:      Patient is wanting to have the same control over her urinary symptoms as she experienced with the Myrbetriq.    Reemphasized that most patient's will see benefit by the 8th week of treatment, but about 10 to 20% of patient may be late responders.  If they happen to be late responders, it is important to continue with the therapy beyond the 12 weekly treatments as many of those patient find benefit.  Today, she is experiencing frequency, intermittency and hesitancy.   She is experiencing 15-18 daytime voids (stable), 0 night time voids (stable), a strong urgency (worsened) and 0 leakage (stable).   She is not having dysuria, gross hematuria suprapubic pain. She denies fever, chills, nausea and vomiting.   PMH: Past Medical History:  Diagnosis Date  . Arthritis    osteo  . Asthma   . Bipolar disorder ([place]) 05/21/14  . Cataracts, bilateral   . DDD (degenerative disc disease), cervical    also back  . Depression   . Dizziness    Positional  . Edema    feet/legs  . Fibromyalgia syndrome   . Fungal infection    Finger nails  . GERD (gastroesophageal reflux disease)   . Gout   . Headache    seasonal allergies  . Heart palpitations   . 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 Hypercholesterolemia   . Multiple sclerosis (HCC)    weakness  . Nephrolithiasis    kidney stones  . Osteoporosis    osteoarthritis  . Pneumonia   . PONV (postoperative nausea and vomiting)    no problem after cataract surgery  . Psoriasis   . Renal stone   . Shortness of breath dyspnea    wheezing  . Sleep apnea 2012   sleep study / slight, no interventions  . Urinary frequency     Surgical History: Past Surgical History:  Procedure Laterality Date  . CATARACT EXTRACTION W/PHACO Left 05/21/2015   Procedure: CATARACT EXTRACTION PHACO AND INTRAOCULAR LENS PLACEMENT (IOC);  Surgeon: Birder Robson, MD;  Location: [facility];  Service: Ophthalmology;  Laterality: Left;  [place] 00:35 AP% 22.9 CDE 8.11 fluid pack lot #[id number]  . CATARACT EXTRACTION W/PHACO Right 06/04/2015   Procedure: CATARACT EXTRACTION PHACO AND INTRAOCULAR LENS PLACEMENT (IOC);  Surgeon: Birder Robson, MD;  Location: [facility];  Service: Ophthalmology;  Laterality: Right;  US:00:48 AP%: 10.5 CDE:5.08 Fluid lot #[id number]  . CYSTOSCOPY/URETEROSCOPY/HOLMIUM LASER/STENT PLACEMENT Bilateral 09/22/2016   Procedure: CYSTOSCOPY/URETEROSCOPY/HOLMIUM LASER/STENT PLACEMENT;  Surgeon: Hollice Espy, MD;  Location: [facility];  Service: Urology;  Laterality: Bilateral;  . EYE SURGERY  2015   tissue biopsy  . FOOT  SURGERY  2015  . JOINT REPLACEMENT Left 2013   hip replacement  . LITHOTRIPSY    . PTOSIS REPAIR Bilateral 02/18/2016   Procedure: BILATERAL PTOSIS REPAIR UPPER EYELIDS;  Surgeon: Karle Starch, MD;  Location: [place];  Service: Ophthalmology;  Laterality: Bilateral;  LEAVE PT EARLY AM  . thumb surgery Right   . TONSILLECTOMY  1973    Home Medications:  Allergies as of 10/26/2017      Reactions   Albuterol Shortness Of Breath, Other (See Comments)   Makes pt feel jittery/ tacycardic   Crestor [rosuvastatin] Other (See Comments)   Joint pain, muscle pain, and hair loss    Halcion [triazolam] Other (See Comments)   Dizziness,headaches,bladder problems   Levaquin [levofloxacin In D5w] Diarrhea, Itching   Shoulder pain   Naproxen Sodium Swelling   Patient tolerates in small doses   Tylenol [acetaminophen] Swelling   Patient tolerates in small doses   Cefaclor Other (See Comments)   Doesn't remember---unsure if actually allergic    Diclofenac Sodium Other (See Comments)   "made very sick"   Sulfa Antibiotics Itching   Unsure of reaction possibly itching   Tramadol Itching, Nausea And Vomiting   Aripiprazole Other (See Comments)   Muscle tension/cramping   Ibuprofen Swelling   Patient tolerates in small doses      Medication List        Accurate as of 10/25/17 11:10 AM. Always use your most recent med list.          amoxicillin 875 MG tablet Commonly known as:  AMOXIL Take 1 tablet (875 mg total) by mouth 2 (two) times daily.   ASPERCREME LIDOCAINE EX Apply 1 application topically 4 (four) times daily as needed (for pain.).   aspirin 325 MG tablet Take 325 mg by mouth every 4 (four) hours as needed for headache.   AZO-TABS 95 MG tablet Generic drug:  phenazopyridine Take 95 mg by mouth 3 (three) times daily as needed for pain.   benzonatate 100 MG capsule Commonly known as:  TESSALON Take 1-2 capsules (100-200 mg total) 3 (three) times daily as needed by mouth for cough.   Biotin 10000 MCG Tabs Take 30,000 mcg by mouth daily.   calcipotriene-betamethasone ointment Commonly known as:  TACLONEX Apply 1 application topically daily as needed (for psorasis).   clobetasol cream 0.05 % Commonly known as:  TEMOVATE Apply 1 application topically 2 (two) times daily.   cyanocobalamin 1000 MCG/ML injection Commonly known as:  (VITAMIN B-12) Inject 1,000 mcg into the muscle every 30 (thirty) days. For 3 Months (June, July, August)   desvenlafaxine 50 MG 24 hr tablet Commonly known as:  PRISTIQ Take 1 tablet (50 mg total) daily by  mouth.   diclofenac sodium 1 % Gel Commonly known as:  VOLTAREN Apply 4 g topically 4 (four) times daily.   docusate sodium 100 MG capsule Commonly known as:  COLACE Take 1 capsule (100 mg total) by mouth 2 (two) times daily.   DULoxetine 20 MG capsule Commonly known as:  CYMBALTA Take 2 capsules (40 mg total) by mouth at bedtime.   HYDROcodone-acetaminophen 10-325 MG tablet Commonly known as:  NORCO Take 1 tablet by mouth every 6 (six) hours as needed for moderate pain or severe pain.   HYDROcodone-acetaminophen 10-325 MG tablet Commonly known as:  NORCO Take 1 tablet by mouth every 6 (six) hours as needed for moderate pain or severe pain.   HYDROcodone-acetaminophen 10-325 MG tablet Commonly known as:  NORCO Take 1 tablet by mouth every 6 (six) hours as needed for moderate pain or severe pain.   hydroquinone 4 % cream 1 APPLICATION TOPICALLY DAILY AS NEEDED FOR BLEMISHES.   hydrOXYzine 50 MG capsule Commonly known as:  VISTARIL 2 q hs   1   prn   ketorolac 10 MG tablet Commonly known as:  TORADOL Take 1 tablet (10 mg total) by mouth 2 (two) times daily as needed for severe pain.   lamoTRIgine 150 MG tablet Commonly known as:  LAMICTAL Take 2 tablets (300 mg total) at bedtime by mouth.   levalbuterol 45 MCG/ACT inhaler Commonly known as:  XOPENEX HFA Inhale 1-2 puffs into the lungs every 6 (six) hours as needed for wheezing.   Lysine 1000 MG Tabs Take 2,000-4,000 mg by mouth at bedtime. 2000 mg scheduled at bedtime and patient will take 4000 mg if she has outbreak   neomycin-bacitracin-polymyxin [phone] ointment Apply 1 application topically 4 (four) times daily as needed (for cut/scrapes.).   nicotine 21 mg/24hr patch Commonly known as:  NICODERM CQ - dosed in mg/24 hours Place 1 patch (21 mg total) onto the skin daily.   nitrofurantoin (macrocrystal-monohydrate) 100 MG capsule Commonly known as:  MACROBID Take by mouth.   polyethylene glycol powder  powder Commonly known as:  GLYCOLAX/MIRALAX MIX 17 GRAMS (1 CAPFUL) WITH 4-8 OZ OF LIQUID AND TAKE BY MOUTH TWICE DAILY AS NEEDED   temazepam 30 MG capsule Commonly known as:  RESTORIL TAKE ONE TO TWO CAPSULES BY MOUTH NIGHTLY AT BEDTIME   tiZANidine 4 MG tablet Commonly known as:  ZANAFLEX Take 2 tablets (8 mg total) by mouth every 6 (six) hours as needed for muscle spasms.   Vitamin D3 10000 units capsule Take 10,000 Units by mouth daily.   zolpidem 10 MG tablet Commonly known as:  AMBIEN Take 1 tablet (10 mg total) by mouth at bedtime.       Allergies:  Allergies  Allergen Reactions  . Albuterol Shortness Of Breath and Other (See Comments)    Makes pt feel jittery/ tacycardic  . Crestor [Rosuvastatin] Other (See Comments)    Joint pain, muscle pain, and hair loss  . Halcion [Triazolam] Other (See Comments)    Dizziness,headaches,bladder problems  . Levaquin [Levofloxacin In D5w] Diarrhea and Itching    Shoulder pain  . Naproxen Sodium Swelling    Patient tolerates in small doses  . Tylenol [Acetaminophen] Swelling    Patient tolerates in small doses  . Cefaclor Other (See Comments)    Doesn't remember---unsure if actually allergic   . Diclofenac Sodium Other (See Comments)    "made very sick"  . Sulfa Antibiotics Itching    Unsure of reaction possibly itching  . Tramadol Itching and Nausea And Vomiting  . Aripiprazole Other (See Comments)    Muscle tension/cramping  . Ibuprofen Swelling    Patient tolerates in small doses    Family History: Family History  Problem Relation Age of Onset  . Cancer Father        Abdomen with mastasis  . Cancer Mother   . Heart disease Mother   . Kidney disease Neg Hx   . Bladder Cancer Neg Hx   . Prostate cancer Neg Hx   . Kidney cancer Neg Hx     Social History:  reports that she quit smoking about 4 years ago. Her smoking use included cigarettes. She quit after 25.00 years of use. she has never used smokeless tobacco.  She reports  that she does not drink alcohol or use drugs.  ROS:                                         Physical Exam: LMP 08/23/2014   Constitutional: Well nourished. Alert and oriented, No acute distress. HEENT: [place] AT, moist mucus membranes. Trachea midline, no masses. Cardiovascular: No clubbing, cyanosis, or edema. Respiratory: Normal respiratory effort, no increased work of breathing. Skin: No rashes, bruises or suspicious lesions. Lymph: No cervical or inguinal adenopathy. Neurologic: Grossly intact, no focal deficits, moving all 4 extremities. Psychiatric: Normal mood and affect.  Laboratory Data: Lab Results  Component Value Date   WBC 7.0 07/27/2017   HGB 13.6 07/27/2017   HCT 40.8 07/27/2017   MCV 96.4 07/27/2017   PLT 326.0 07/27/2017    Lab Results  Component Value Date   CREATININE 0.90 07/27/2017    Lab Results  Component Value Date   HGBA1C 6.1 03/18/2017    Lab Results  Component Value Date   TSH 1.14 03/18/2017       Component Value Date/Time   CHOL 316 (H) 03/18/2017 1018   HDL 50.60 03/18/2017 1018   CHOLHDL 6 03/18/2017 1018   VLDL 53.8 (H) 03/18/2017 1018   LDLCALC 223 (H) 11/13/2014 0908    I have reviewed the labs   PTNS treatment: The needle electrode was inserted into the lower, inner aspect of the patient's *** leg. The surface electrode was placed on the inside arch of the foot on the treatment leg. The lead set was connected to the stimulator and the needle electrode clip was connected to the needle electrode. The stimulator that produces an adjustable electrical pulse that travels to the sacral nerve plexus via the tibial nerve was increased to *** until the patient received a ***.     Assessment & Plan:    1. Frequency  Treatment Plan:  The needle electrode was removed without difficulty to the patient.  Patient tolerated the procedure for 30 minutes.  She will return next week for # 12 out of 12 of  their weekly PTNS treatment's  No Follow-up on file.  These notes generated with voice recognition software. I apologize for typographical errors.  Zara Council, [facility] [address], [place], [place] [address] [phone]

## 2017-10-26 ENCOUNTER — Telehealth (HOSPITAL_COMMUNITY): Payer: Self-pay

## 2017-10-26 ENCOUNTER — Ambulatory Visit: Payer: Self-pay | Admitting: Urology

## 2017-10-26 ENCOUNTER — Encounter: Payer: Self-pay | Admitting: Urology

## 2017-10-26 ENCOUNTER — Ambulatory Visit: Payer: PPO | Admitting: Urology

## 2017-10-26 VITALS — BP 105/81 | HR 78 | Ht 69.0 in | Wt 211.0 lb

## 2017-10-26 DIAGNOSIS — N3281 Overactive bladder: Secondary | ICD-10-CM | POA: Diagnosis not present

## 2017-10-26 DIAGNOSIS — R350 Frequency of micturition: Secondary | ICD-10-CM

## 2017-10-26 NOTE — Telephone Encounter (Signed)
Medication problems - Telephone call with patient to follow up on message she stopped Pristiq 2 days ago due to problems with side effects, increased BP, vision problems and mood changes. Patient reported she restarted Cymbalta at 40 mg and has taken for 2 days with no problems and will continue with this until she sees him on 11/26/17.  Patient stated she tried to stick with Pristiq but just could not continue with it as there was so many side effects and was even seeing things too.  Patient reported she wanted Dr. Casimiro Needle to be informed of change back to Cymbalta and agreed if he had any problems with this we would have him call back if needed.  Patient stated she agreed with this plan and reports she thinks she will be fine and better on Cymbalta until she returns 11/26/17.

## 2017-10-26 NOTE — Progress Notes (Signed)
PTNS  Session # 11  Health & Social Factors: Change Caffeine: 1 Alcohol: 0 Daytime voids #per day: 13 Night-time voids #per night: 2 Urgency: Strong Incontinence Episodes #per day: 0 Ankle used: Right Treatment Setting: 12 Feeling/ Response: Both   Preformed By: Zara Council, PA   Assistant: Toniann Fail, LPN  Follow Up: 1 week

## 2017-10-26 NOTE — Progress Notes (Signed)
Chief Complaint:  Chief Complaint  Patient presents with  . PTNS    HPI: Patient is a 56 year old Caucasian female with multiple sclerosis with a history of nephrolithiasis and urinary frequency who presents today to begin 12 weekly treatments of PTNS.  This will be # 11/12.           Anticholinergics - patient failed Vesicare     Myrbetriq was effective, but she found it cost prohibitive      Contraindications present for PTNS      Pacemaker - NO      Implantable defibrillator - NO      History of abnormal bleeding - NO      History of neuropathies or nerve damage - NO  Discussed with patient possible complications of procedure, such as discomfort, bleeding at insertion/stimulation site, procedure consent signed  Patient goals:      Patient is wanting to have the same control over her urinary symptoms as she experienced with the Myrbetriq.    Reemphasized that most patient's will see benefit by the 8th week of treatment, but about 10 to 20% of patient may be late responders.  If they happen to be late responders, it is important to continue with the therapy beyond the 12 weekly treatments as many of those patient find benefit.  Today, she is experiencing frequency, urgency, nocturia, straining to urinate, hesitancy and a weak stream.  She is experiencing 13 daytime voids (improved), 2 night time voids (worse), a strong urgency (stable) and 0 leakage (stable).   She is not having dysuria, gross hematuria suprapubic pain. She denies fever, chills, nausea and vomiting.   PMH: Past Medical History:  Diagnosis Date  . Arthritis    osteo  . Asthma   . Bipolar disorder ([place]) 05/21/14  . Cataracts, bilateral   . DDD (degenerative disc disease), cervical    also back  . Depression   . Dizziness    Positional  . Edema    feet/legs  . Fibromyalgia syndrome   . Fungal infection    Finger nails  . GERD (gastroesophageal reflux disease)   . Gout   . Headache    seasonal  allergies  . Heart palpitations   . 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 Hypercholesterolemia   . Multiple sclerosis ([facility])    weakness  . Nephrolithiasis    kidney stones  . Osteoporosis    osteoarthritis  . Pneumonia   . PONV (postoperative nausea and vomiting)    no problem after cataract surgery  . Psoriasis   . Renal stone   . Shortness of breath dyspnea    wheezing  . Sleep apnea 2012   sleep study / slight, no interventions  . Urinary frequency     Surgical History: Past Surgical History:  Procedure Laterality Date  . CATARACT EXTRACTION W/PHACO Left 05/21/2015   Procedure: CATARACT EXTRACTION PHACO AND INTRAOCULAR LENS PLACEMENT (IOC);  Surgeon: Birder Robson, MD;  Location: [facility];  Service: Ophthalmology;  Laterality: Left;  [place] 00:35 AP% 22.9 CDE 8.11 fluid pack lot #[id number]  . CATARACT EXTRACTION W/PHACO Right 06/04/2015   Procedure: CATARACT EXTRACTION PHACO AND INTRAOCULAR LENS PLACEMENT (IOC);  Surgeon: Birder Robson, MD;  Location: [facility];  Service: Ophthalmology;  Laterality: Right;  US:00:48 AP%: 10.5 CDE:5.08 Fluid lot #[id number]  . CYSTOSCOPY/URETEROSCOPY/HOLMIUM LASER/STENT PLACEMENT Bilateral 09/22/2016   Procedure: CYSTOSCOPY/URETEROSCOPY/HOLMIUM LASER/STENT PLACEMENT;  Surgeon: Hollice Espy, MD;  Location: [facility];  Service: Urology;  Laterality: Bilateral;  .  EYE SURGERY  2015   tissue biopsy  . FOOT SURGERY  2015  . JOINT REPLACEMENT Left 2013   hip replacement  . LITHOTRIPSY    . PTOSIS REPAIR Bilateral 02/18/2016   Procedure: BILATERAL PTOSIS REPAIR UPPER EYELIDS;  Surgeon: Karle Starch, MD;  Location: [facility];  Service: Ophthalmology;  Laterality: Bilateral;  LEAVE PT EARLY AM  . thumb surgery Right   . TONSILLECTOMY  1973    Home Medications:  Allergies as of 10/26/2017      Reactions   Albuterol Shortness Of Breath, Other (See Comments)   Makes pt feel jittery/ tacycardic   Crestor [rosuvastatin] Other (See  Comments)   Joint pain, muscle pain, and hair loss   Halcion [triazolam] Other (See Comments)   Dizziness,headaches,bladder problems   Levaquin [levofloxacin In D5w] Diarrhea, Itching   Shoulder pain   Naproxen Sodium Swelling   Patient tolerates in small doses   Tylenol [acetaminophen] Swelling   Patient tolerates in small doses   Cefaclor Other (See Comments)   Doesn't remember---unsure if actually allergic    Diclofenac Sodium Other (See Comments)   "made very sick"   Sulfa Antibiotics Itching   Unsure of reaction possibly itching   Tramadol Itching, Nausea And Vomiting   Aripiprazole Other (See Comments)   Muscle tension/cramping   Ibuprofen Swelling   Patient tolerates in small doses      Medication List        Accurate as of 10/26/17  6:53 PM. Always use your most recent med list.          amoxicillin 875 MG tablet Commonly known as:  AMOXIL Take 1 tablet (875 mg total) by mouth 2 (two) times daily.   ASPERCREME LIDOCAINE EX Apply 1 application topically 4 (four) times daily as needed (for pain.).   aspirin 325 MG tablet Take 325 mg by mouth every 4 (four) hours as needed for headache.   AZO-TABS 95 MG tablet Generic drug:  phenazopyridine Take 95 mg by mouth 3 (three) times daily as needed for pain.   benzonatate 100 MG capsule Commonly known as:  TESSALON Take 1-2 capsules (100-200 mg total) 3 (three) times daily as needed by mouth for cough.   Biotin 10000 MCG Tabs Take 30,000 mcg by mouth daily.   calcipotriene-betamethasone ointment Commonly known as:  TACLONEX Apply 1 application topically daily as needed (for psorasis).   clobetasol cream 0.05 % Commonly known as:  TEMOVATE Apply 1 application topically 2 (two) times daily.   cyanocobalamin 1000 MCG/ML injection Commonly known as:  (VITAMIN B-12) Inject 1,000 mcg into the muscle every 30 (thirty) days. For 3 Months (June, July, August)   diclofenac sodium 1 % Gel Commonly known as:   VOLTAREN Apply 4 g topically 4 (four) times daily.   docusate sodium 100 MG capsule Commonly known as:  COLACE Take 1 capsule (100 mg total) by mouth 2 (two) times daily.   DULoxetine 20 MG capsule Commonly known as:  CYMBALTA Take 2 capsules (40 mg total) by mouth at bedtime.   HYDROcodone-acetaminophen 10-325 MG tablet Commonly known as:  NORCO Take 1 tablet by mouth every 6 (six) hours as needed for moderate pain or severe pain.   HYDROcodone-acetaminophen 10-325 MG tablet Commonly known as:  NORCO Take 1 tablet by mouth every 6 (six) hours as needed for moderate pain or severe pain.   HYDROcodone-acetaminophen 10-325 MG tablet Commonly known as:  NORCO Take 1 tablet by mouth every 6 (six)  hours as needed for moderate pain or severe pain.   hydroquinone 4 % cream 1 APPLICATION TOPICALLY DAILY AS NEEDED FOR BLEMISHES.   hydrOXYzine 50 MG capsule Commonly known as:  VISTARIL 2 q hs   1   prn   ketorolac 10 MG tablet Commonly known as:  TORADOL Take 1 tablet (10 mg total) by mouth 2 (two) times daily as needed for severe pain.   lamoTRIgine 150 MG tablet Commonly known as:  LAMICTAL Take 2 tablets (300 mg total) at bedtime by mouth.   levalbuterol 45 MCG/ACT inhaler Commonly known as:  XOPENEX HFA Inhale 1-2 puffs into the lungs every 6 (six) hours as needed for wheezing.   Lysine 1000 MG Tabs Take 2,000-4,000 mg by mouth at bedtime. 2000 mg scheduled at bedtime and patient will take 4000 mg if she has outbreak   neomycin-bacitracin-polymyxin [phone] ointment Apply 1 application topically 4 (four) times daily as needed (for cut/scrapes.).   nicotine 21 mg/24hr patch Commonly known as:  NICODERM CQ - dosed in mg/24 hours Place 1 patch (21 mg total) onto the skin daily.   nitrofurantoin (macrocrystal-monohydrate) 100 MG capsule Commonly known as:  MACROBID Take by mouth.   polyethylene glycol powder powder Commonly known as:  GLYCOLAX/MIRALAX MIX 17 GRAMS (1  CAPFUL) WITH 4-8 OZ OF LIQUID AND TAKE BY MOUTH TWICE DAILY AS NEEDED   temazepam 30 MG capsule Commonly known as:  RESTORIL TAKE ONE TO TWO CAPSULES BY MOUTH NIGHTLY AT BEDTIME   tiZANidine 4 MG tablet Commonly known as:  ZANAFLEX Take 2 tablets (8 mg total) by mouth every 6 (six) hours as needed for muscle spasms.   Vitamin D3 10000 units capsule Take 10,000 Units by mouth daily.   zolpidem 10 MG tablet Commonly known as:  AMBIEN Take 1 tablet (10 mg total) by mouth at bedtime.       Allergies:  Allergies  Allergen Reactions  . Albuterol Shortness Of Breath and Other (See Comments)    Makes pt feel jittery/ tacycardic  . Crestor [Rosuvastatin] Other (See Comments)    Joint pain, muscle pain, and hair loss  . Halcion [Triazolam] Other (See Comments)    Dizziness,headaches,bladder problems  . Levaquin [Levofloxacin In D5w] Diarrhea and Itching    Shoulder pain  . Naproxen Sodium Swelling    Patient tolerates in small doses  . Tylenol [Acetaminophen] Swelling    Patient tolerates in small doses  . Cefaclor Other (See Comments)    Doesn't remember---unsure if actually allergic   . Diclofenac Sodium Other (See Comments)    "made very sick"  . Sulfa Antibiotics Itching    Unsure of reaction possibly itching  . Tramadol Itching and Nausea And Vomiting  . Aripiprazole Other (See Comments)    Muscle tension/cramping  . Ibuprofen Swelling    Patient tolerates in small doses    Family History: Family History  Problem Relation Age of Onset  . Cancer Father        Abdomen with mastasis  . Cancer Mother   . Heart disease Mother   . Kidney disease Neg Hx   . Bladder Cancer Neg Hx   . Prostate cancer Neg Hx   . Kidney cancer Neg Hx     Social History:  reports that she quit smoking about 4 years ago. Her smoking use included cigarettes. She quit after 25.00 years of use. she has never used smokeless tobacco. She reports that she does not drink alcohol or use  drugs.  ROS: UROLOGY Frequent Urination?: Yes Hard to postpone urination?: Yes Burning/pain with urination?: No Get up at night to urinate?: Yes Leakage of urine?: No Urine stream starts and stops?: No Trouble starting stream?: Yes Do you have to strain to urinate?: Yes Blood in urine?: No Urinary tract infection?: No Sexually transmitted disease?: No Injury to kidneys or bladder?: No Painful intercourse?: No Weak stream?: Yes Currently pregnant?: No Vaginal bleeding?: No Last menstrual period?: n  Gastrointestinal Nausea?: No Vomiting?: No Indigestion/heartburn?: Yes Diarrhea?: No Constipation?: No  Constitutional Fever: No Night sweats?: Yes Weight loss?: No Fatigue?: Yes  Skin Skin rash/lesions?: No Itching?: No  Eyes Blurred vision?: No Double vision?: No  Ears/Nose/Throat Sore throat?: No Sinus problems?: No  Hematologic/Lymphatic Swollen glands?: No Easy bruising?: Yes  Cardiovascular Leg swelling?: No Chest pain?: No  Respiratory Cough?: No Shortness of breath?: No  Endocrine Excessive thirst?: No  Musculoskeletal Back pain?: Yes Joint pain?: Yes  Neurological Headaches?: No Dizziness?: No  Psychologic Depression?: Yes Anxiety?: Yes   Physical Exam: BP 105/81   Pulse 78   Ht 5\' 9"  (1.753 m)   Wt 211 lb (95.7 kg)   LMP 08/23/2014   BMI 31.16 kg/m   Constitutional: Well nourished. Alert and oriented, No acute distress. HEENT: [place] AT, moist mucus membranes. Trachea midline, no masses. Cardiovascular: No clubbing, cyanosis, or edema. Respiratory: Normal respiratory effort, no increased work of breathing. Skin: No rashes, bruises or suspicious lesions. Lymph: No cervical or inguinal adenopathy. Neurologic: Grossly intact, no focal deficits, moving all 4 extremities. Psychiatric: Normal mood and affect.  Laboratory Data: Lab Results  Component Value Date   WBC 7.0 07/27/2017   HGB 13.6 07/27/2017   HCT 40.8 07/27/2017    MCV 96.4 07/27/2017   PLT 326.0 07/27/2017    Lab Results  Component Value Date   CREATININE 0.90 07/27/2017    Lab Results  Component Value Date   HGBA1C 6.1 03/18/2017    Lab Results  Component Value Date   TSH 1.14 03/18/2017       Component Value Date/Time   CHOL 316 (H) 03/18/2017 1018   HDL 50.60 03/18/2017 1018   CHOLHDL 6 03/18/2017 1018   VLDL 53.8 (H) 03/18/2017 1018   LDLCALC 223 (H) 11/13/2014 0908    I have reviewed the labs   PTNS treatment: The needle electrode was inserted into the lower, inner aspect of the patient's right leg. The surface electrode was placed on the inside arch of the foot on the treatment leg. The lead set was connected to the stimulator and the needle electrode clip was connected to the needle electrode. The stimulator that produces an adjustable electrical pulse that travels to the sacral nerve plexus via the tibial nerve was increased to 12 until the patient received a toe flex and sensory response.     Assessment & Plan:    1. Frequency  Treatment Plan:  The needle electrode was removed without difficulty to the patient.  Patient tolerated the procedure for 30 minutes.  She will return next week for # 12 out of 12 of their weekly PTNS treatment's  Return in about 1 week (around 11/02/2017) for # 12/PTNS.  These notes generated with voice recognition software. I apologize for typographical errors.  Zara Council, [facility] [address], [place], [place] [address] [phone]

## 2017-10-27 ENCOUNTER — Ambulatory Visit: Payer: Self-pay | Admitting: Urology

## 2017-10-29 NOTE — Progress Notes (Signed)
Chief Complaint:  Chief Complaint  Patient presents with  . PTNS    #12    HPI: Patient is a 56 year old Caucasian female with multiple sclerosis with a history of nephrolithiasis and urinary frequency who presents today to begin 12 weekly treatments of PTNS.  This will be # 12/12.           Anticholinergics - patient failed Vesicare     Myrbetriq was effective, but she found it cost prohibitive      Contraindications present for PTNS      Pacemaker - NO      Implantable defibrillator - NO      History of abnormal bleeding - NO      History of neuropathies or nerve damage - NO  Discussed with patient possible complications of procedure, such as discomfort, bleeding at insertion/stimulation site, procedure consent signed  Patient goals:      Patient is wanting to have the same control over her urinary symptoms as she experienced with the Myrbetriq.    Reemphasized that most patient's will see benefit by the 8th week of treatment, but about 10 to 20% of patient may be late responders.  If they happen to be late responders, it is important to continue with the therapy beyond the 12 weekly treatments as many of those patient find benefit.  Today, she is experiencing frequency, urgency, nocturia, hesitancy and a weak stream.  She states that she has had an increase in her frequency and nocturia over the weekend.  She states she went 10 times in the three hours she was visited her sister in the hospital.  She is experiencing 45 daytime voids (worse), 2 night time voids (stable), a severe urgency (worse) and 0 leakage (stable).   She is not having dysuria, gross hematuria suprapubic pain. She denies fever, chills, nausea and vomiting.     PMH: Past Medical History:  Diagnosis Date  . Arthritis    osteo  . Asthma   . Bipolar disorder ([place]) 05/21/14  . Cataracts, bilateral   . DDD (degenerative disc disease), cervical    also back  . Depression   . Dizziness    Positional  .  Edema    feet/legs  . Fibromyalgia syndrome   . Fungal infection    Finger nails  . GERD (gastroesophageal reflux disease)   . Gout   . Headache    seasonal allergies  . Heart palpitations   . 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 Hypercholesterolemia   . Multiple sclerosis (HCC)    weakness  . Nephrolithiasis    kidney stones  . Osteoporosis    osteoarthritis  . Pneumonia   . PONV (postoperative nausea and vomiting)    no problem after cataract surgery  . Psoriasis   . Renal stone   . Shortness of breath dyspnea    wheezing  . Sleep apnea 2012   sleep study / slight, no interventions  . Urinary frequency     Surgical History: Past Surgical History:  Procedure Laterality Date  . CATARACT EXTRACTION W/PHACO Left 05/21/2015   Procedure: CATARACT EXTRACTION PHACO AND INTRAOCULAR LENS PLACEMENT (IOC);  Surgeon: Birder Robson, MD;  Location: [facility];  Service: Ophthalmology;  Laterality: Left;  [place] 00:35 AP% 22.9 CDE 8.11 fluid pack lot #[id number]  . CATARACT EXTRACTION W/PHACO Right 06/04/2015   Procedure: CATARACT EXTRACTION PHACO AND INTRAOCULAR LENS PLACEMENT (IOC);  Surgeon: Birder Robson, MD;  Location: [facility];  Service: Ophthalmology;  Laterality:  Right;  US:00:48 AP%: 10.5 CDE:5.08 Fluid lot #[id number]  . CYSTOSCOPY/URETEROSCOPY/HOLMIUM LASER/STENT PLACEMENT Bilateral 09/22/2016   Procedure: CYSTOSCOPY/URETEROSCOPY/HOLMIUM LASER/STENT PLACEMENT;  Surgeon: Hollice Espy, MD;  Location: [facility];  Service: Urology;  Laterality: Bilateral;  . EYE SURGERY  2015   tissue biopsy  . FOOT SURGERY  2015  . JOINT REPLACEMENT Left 2013   hip replacement  . LITHOTRIPSY    . PTOSIS REPAIR Bilateral 02/18/2016   Procedure: BILATERAL PTOSIS REPAIR UPPER EYELIDS;  Surgeon: Karle Starch, MD;  Location: [place];  Service: Ophthalmology;  Laterality: Bilateral;  LEAVE PT EARLY AM  . thumb surgery Right   . TONSILLECTOMY  1973    Home Medications:  Allergies as  of 11/01/2017      Reactions   Albuterol Shortness Of Breath, Other (See Comments)   Makes pt feel jittery/ tacycardic   Crestor [rosuvastatin] Other (See Comments)   Joint pain, muscle pain, and hair loss   Halcion [triazolam] Other (See Comments)   Dizziness,headaches,bladder problems   Levaquin [levofloxacin In D5w] Diarrhea, Itching   Shoulder pain   Naproxen Sodium Swelling   Patient tolerates in small doses   Tylenol [acetaminophen] Swelling   Patient tolerates in small doses   Cefaclor Other (See Comments)   Doesn't remember---unsure if actually allergic    Diclofenac Sodium Other (See Comments)   "made very sick"   Sulfa Antibiotics Itching   Unsure of reaction possibly itching   Tramadol Itching, Nausea And Vomiting   Aripiprazole Other (See Comments)   Muscle tension/cramping   Ibuprofen Swelling   Patient tolerates in small doses      Medication List        Accurate as of 11/01/17  3:56 PM. Always use your most recent med list.          amoxicillin 875 MG tablet Commonly known as:  AMOXIL Take 1 tablet (875 mg total) by mouth 2 (two) times daily.   ASPERCREME LIDOCAINE EX Apply 1 application topically 4 (four) times daily as needed (for pain.).   aspirin 325 MG tablet Take 325 mg by mouth every 4 (four) hours as needed for headache.   AZO-TABS 95 MG tablet Generic drug:  phenazopyridine Take 95 mg by mouth 3 (three) times daily as needed for pain.   benzonatate 100 MG capsule Commonly known as:  TESSALON Take 1-2 capsules (100-200 mg total) 3 (three) times daily as needed by mouth for cough.   Biotin 10000 MCG Tabs Take 30,000 mcg by mouth daily.   calcipotriene-betamethasone ointment Commonly known as:  TACLONEX Apply 1 application topically daily as needed (for psorasis).   clobetasol cream 0.05 % Commonly known as:  TEMOVATE Apply 1 application topically 2 (two) times daily.   cyanocobalamin 1000 MCG/ML injection Commonly known as:   (VITAMIN B-12) Inject 1,000 mcg into the muscle every 30 (thirty) days. For 3 Months (June, July, August)   diclofenac sodium 1 % Gel Commonly known as:  VOLTAREN Apply 4 g topically 4 (four) times daily.   docusate sodium 100 MG capsule Commonly known as:  COLACE Take 1 capsule (100 mg total) by mouth 2 (two) times daily.   DULoxetine 20 MG capsule Commonly known as:  CYMBALTA Take 2 capsules (40 mg total) by mouth at bedtime.   HYDROcodone-acetaminophen 10-325 MG tablet Commonly known as:  NORCO Take 1 tablet by mouth every 6 (six) hours as needed for moderate pain or severe pain.   HYDROcodone-acetaminophen 10-325 MG tablet Commonly known  as:  NORCO Take 1 tablet by mouth every 6 (six) hours as needed for moderate pain or severe pain.   HYDROcodone-acetaminophen 10-325 MG tablet Commonly known as:  NORCO Take 1 tablet by mouth every 6 (six) hours as needed for moderate pain or severe pain.   hydroquinone 4 % cream 1 APPLICATION TOPICALLY DAILY AS NEEDED FOR BLEMISHES.   hydrOXYzine 50 MG capsule Commonly known as:  VISTARIL 2 q hs   1   prn   ketorolac 10 MG tablet Commonly known as:  TORADOL Take 1 tablet (10 mg total) by mouth 2 (two) times daily as needed for severe pain.   lamoTRIgine 150 MG tablet Commonly known as:  LAMICTAL Take 2 tablets (300 mg total) at bedtime by mouth.   levalbuterol 45 MCG/ACT inhaler Commonly known as:  XOPENEX HFA Inhale 1-2 puffs into the lungs every 6 (six) hours as needed for wheezing.   Lysine 1000 MG Tabs Take 2,000-4,000 mg by mouth at bedtime. 2000 mg scheduled at bedtime and patient will take 4000 mg if she has outbreak   neomycin-bacitracin-polymyxin [id number] ointment Apply 1 application topically 4 (four) times daily as needed (for cut/scrapes.).   nicotine 21 mg/24hr patch Commonly known as:  NICODERM CQ - dosed in mg/24 hours Place 1 patch (21 mg total) onto the skin daily.   nitrofurantoin  (macrocrystal-monohydrate) 100 MG capsule Commonly known as:  MACROBID Take by mouth.   polyethylene glycol powder powder Commonly known as:  GLYCOLAX/MIRALAX MIX 17 GRAMS (1 CAPFUL) WITH 4-8 OZ OF LIQUID AND TAKE BY MOUTH TWICE DAILY AS NEEDED   temazepam 30 MG capsule Commonly known as:  RESTORIL TAKE ONE TO TWO CAPSULES BY MOUTH NIGHTLY AT BEDTIME   tiZANidine 4 MG tablet Commonly known as:  ZANAFLEX Take 2 tablets (8 mg total) by mouth every 6 (six) hours as needed for muscle spasms.   Vitamin D3 10000 units capsule Take 10,000 Units by mouth daily.   zolpidem 10 MG tablet Commonly known as:  AMBIEN Take 1 tablet (10 mg total) by mouth at bedtime.       Allergies:  Allergies  Allergen Reactions  . Albuterol Shortness Of Breath and Other (See Comments)    Makes pt feel jittery/ tacycardic  . Crestor [Rosuvastatin] Other (See Comments)    Joint pain, muscle pain, and hair loss  . Halcion [Triazolam] Other (See Comments)    Dizziness,headaches,bladder problems  . Levaquin [Levofloxacin In D5w] Diarrhea and Itching    Shoulder pain  . Naproxen Sodium Swelling    Patient tolerates in small doses  . Tylenol [Acetaminophen] Swelling    Patient tolerates in small doses  . Cefaclor Other (See Comments)    Doesn't remember---unsure if actually allergic   . Diclofenac Sodium Other (See Comments)    "made very sick"  . Sulfa Antibiotics Itching    Unsure of reaction possibly itching  . Tramadol Itching and Nausea And Vomiting  . Aripiprazole Other (See Comments)    Muscle tension/cramping  . Ibuprofen Swelling    Patient tolerates in small doses    Family History: Family History  Problem Relation Age of Onset  . Cancer Father        Abdomen with mastasis  . Cancer Mother   . Heart disease Mother   . Kidney disease Neg Hx   . Bladder Cancer Neg Hx   . Prostate cancer Neg Hx   . Kidney cancer Neg Hx     Social History:  reports that she quit smoking about 4  years ago. Her smoking use included cigarettes. She quit after 25.00 years of use. she has never used smokeless tobacco. She reports that she does not drink alcohol or use drugs.  ROS: UROLOGY Frequent Urination?: Yes Hard to postpone urination?: Yes Burning/pain with urination?: No Get up at night to urinate?: Yes Leakage of urine?: No Urine stream starts and stops?: No Trouble starting stream?: Yes Do you have to strain to urinate?: No Blood in urine?: No Urinary tract infection?: No Sexually transmitted disease?: No Injury to kidneys or bladder?: No Painful intercourse?: No Weak stream?: Yes Currently pregnant?: No Vaginal bleeding?: No Last menstrual period?: n  Gastrointestinal Nausea?: No Vomiting?: No Indigestion/heartburn?: Yes Diarrhea?: No Constipation?: No  Constitutional Fever: No Night sweats?: Yes Weight loss?: Yes Fatigue?: Yes  Skin Skin rash/lesions?: No Itching?: No  Eyes Blurred vision?: No Double vision?: No  Ears/Nose/Throat Sore throat?: No Sinus problems?: Yes  Hematologic/Lymphatic Swollen glands?: No Easy bruising?: No  Cardiovascular Leg swelling?: No Chest pain?: No  Respiratory Cough?: No Shortness of breath?: No  Endocrine Excessive thirst?: No  Musculoskeletal Back pain?: Yes Joint pain?: Yes  Neurological Headaches?: No Dizziness?: No  Psychologic Depression?: Yes Anxiety?: Yes   Physical Exam: BP 118/76   Pulse 97   Ht 5\' 9"  (1.753 m)   Wt 207 lb (93.9 kg)   LMP 08/23/2014   BMI 30.57 kg/m   Constitutional: Well nourished. Alert and oriented, No acute distress. HEENT: [place] AT, moist mucus membranes. Trachea midline, no masses. Cardiovascular: No clubbing, cyanosis, or edema. Respiratory: Normal respiratory effort, no increased work of breathing. Skin: No rashes, bruises or suspicious lesions. Lymph: No cervical or inguinal adenopathy. Neurologic: Grossly intact, no focal deficits, moving all 4  extremities. Psychiatric: Normal mood and affect.  Laboratory Data: Lab Results  Component Value Date   WBC 7.0 07/27/2017   HGB 13.6 07/27/2017   HCT 40.8 07/27/2017   MCV 96.4 07/27/2017   PLT 326.0 07/27/2017    Lab Results  Component Value Date   CREATININE 0.90 07/27/2017    Lab Results  Component Value Date   HGBA1C 6.1 03/18/2017    Lab Results  Component Value Date   TSH 1.14 03/18/2017       Component Value Date/Time   CHOL 316 (H) 03/18/2017 1018   HDL 50.60 03/18/2017 1018   CHOLHDL 6 03/18/2017 1018   VLDL 53.8 (H) 03/18/2017 1018   LDLCALC 223 (H) 11/13/2014 0908    I have reviewed the labs   PTNS treatment: The needle electrode was inserted into the lower, inner aspect of the patient's right leg. The surface electrode was placed on the inside arch of the foot on the treatment leg. The lead set was connected to the stimulator and the needle electrode clip was connected to the needle electrode. The stimulator that produces an adjustable electrical pulse that travels to the sacral nerve plexus via the tibial nerve was increased to 15 until the patient received a toe flex and a sensory response.     Assessment & Plan:    1. Frequency  Treatment Plan:  The needle electrode was removed without difficulty to the patient.  Patient tolerated the procedure for 30 minutes.  She will return next month to start her maintenance PTNS.    She is having a significant increase in her frequency.  She does not feel it is due to an infection.  She is going to check her  blood sugars at home.  She also feels that it may be due the weaning off her Pristique.   She will call back if sugars are normal so that we can do an UA.    Return in about 1 month (around 12/02/2017) for Maintenance PTNS.  These notes generated with voice recognition software. I apologize for typographical errors.  Zara Council, [facility] [address], [place], [place] [address] [phone]

## 2017-11-01 ENCOUNTER — Ambulatory Visit: Payer: PPO | Admitting: Urology

## 2017-11-01 ENCOUNTER — Encounter: Payer: Self-pay | Admitting: Urology

## 2017-11-01 VITALS — BP 118/76 | HR 97 | Ht 69.0 in | Wt 207.0 lb

## 2017-11-01 DIAGNOSIS — R350 Frequency of micturition: Secondary | ICD-10-CM

## 2017-11-01 LAB — PTNS-PERCUTANEOUS TIBIAL NERVE STIMULATION

## 2017-11-01 NOTE — Progress Notes (Signed)
PTNS  Session # 12  Health & Social Factors: no change Caffeine: 0-1cups Alcohol: 0 Daytime voids #per day: 45 Night-time voids #per night: 1-2 Urgency: severe Incontinence Episodes #per day: 0 Ankle used: Right Treatment Setting: 15 Feeling/ Response: Both Comments: n/a  Preformed By: Zara Council, PA-C  Assistant: C. Corinna Capra, CMA

## 2017-11-02 ENCOUNTER — Encounter: Payer: Self-pay | Admitting: Urology

## 2017-11-02 ENCOUNTER — Other Ambulatory Visit (HOSPITAL_COMMUNITY): Payer: Self-pay

## 2017-11-02 ENCOUNTER — Encounter: Payer: Self-pay | Admitting: Primary Care

## 2017-11-02 ENCOUNTER — Ambulatory Visit: Payer: Self-pay | Admitting: Urology

## 2017-11-02 MED ORDER — ZOLPIDEM TARTRATE 5 MG PO TABS: 5.0000 mg | ORAL_TABLET | Freq: Every day | ORAL | 2 refills | Status: DC

## 2017-11-03 ENCOUNTER — Ambulatory Visit (INDEPENDENT_AMBULATORY_CARE_PROVIDER_SITE_OTHER): Payer: PPO | Admitting: Family Medicine

## 2017-11-03 ENCOUNTER — Ambulatory Visit: Payer: Self-pay | Admitting: Urology

## 2017-11-03 DIAGNOSIS — N3090 Cystitis, unspecified without hematuria: Secondary | ICD-10-CM | POA: Diagnosis not present

## 2017-11-03 LAB — POCT URINALYSIS DIPSTICK
Glucose, UA: NEGATIVE
Ketones, UA: NEGATIVE
Nitrite, UA: POSITIVE
PH UA: 6 (ref 5.0–8.0)
Protein, UA: NEGATIVE
RBC UA: 10
SPEC GRAV UA: 1.01 (ref 1.010–1.025)
UROBILINOGEN UA: 0.2 U/dL

## 2017-11-03 MED ORDER — NITROFURANTOIN MONOHYD MACRO 100 MG PO CAPS: 100.0000 mg | ORAL_CAPSULE | Freq: Two times a day (BID) | ORAL | 0 refills | Status: DC

## 2017-11-03 NOTE — Progress Notes (Signed)
Subjective:    Patient ID: Emily Moon, female    DOB: 11/30/1960, 56 y.o.   MRN: [id number]  HPI This is a 56 yo female who presents today with dysuria x 4 days, also increased frequency, pressure. Is currently undergoing PTNS. Has had questionable side effects after Pristique. Has been taking Azo. No back pain, no nausea or vomiting. Low grade fever today. URI finally resolved.   Past Medical History:  Diagnosis Date  . Arthritis    osteo  . Asthma   . Bipolar disorder ([place]) 05/21/14  . Cataracts, bilateral   . DDD (degenerative disc disease), cervical    also back  . Depression   . Dizziness    Positional  . Edema    feet/legs  . Fibromyalgia syndrome   . Fungal infection    Finger nails  . GERD (gastroesophageal reflux disease)   . Gout   . Headache    seasonal allergies  . Heart palpitations   . 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 Hypercholesterolemia   . Multiple sclerosis (HCC)    weakness  . Nephrolithiasis    kidney stones  . Osteoporosis    osteoarthritis  . Pneumonia   . PONV (postoperative nausea and vomiting)    no problem after cataract surgery  . Psoriasis   . Renal stone   . Shortness of breath dyspnea    wheezing  . Sleep apnea 2012   sleep study / slight, no interventions  . Urinary frequency    Past Surgical History:  Procedure Laterality Date  . CATARACT EXTRACTION W/PHACO Left 05/21/2015   Procedure: CATARACT EXTRACTION PHACO AND INTRAOCULAR LENS PLACEMENT (IOC);  Surgeon: Birder Robson, MD;  Location: [facility];  Service: Ophthalmology;  Laterality: Left;  [place] 00:35 AP% 22.9 CDE 8.11 fluid pack lot #[id number] H  . CATARACT EXTRACTION W/PHACO Right 06/04/2015   Procedure: CATARACT EXTRACTION PHACO AND INTRAOCULAR LENS PLACEMENT (IOC);  Surgeon: Birder Robson, MD;  Location: [facility];  Service: Ophthalmology;  Laterality: Right;  US:00:48 AP%: 10.5 CDE:5.08 Fluid lot #[id number] H  . CYSTOSCOPY/URETEROSCOPY/HOLMIUM LASER/STENT  PLACEMENT Bilateral 09/22/2016   Procedure: CYSTOSCOPY/URETEROSCOPY/HOLMIUM LASER/STENT PLACEMENT;  Surgeon: Hollice Espy, MD;  Location: [facility];  Service: Urology;  Laterality: Bilateral;  . EYE SURGERY  2015   tissue biopsy  . FOOT SURGERY  2015  . JOINT REPLACEMENT Left 2013   hip replacement  . LITHOTRIPSY    . PTOSIS REPAIR Bilateral 02/18/2016   Procedure: BILATERAL PTOSIS REPAIR UPPER EYELIDS;  Surgeon: Karle Starch, MD;  Location: [facility];  Service: Ophthalmology;  Laterality: Bilateral;  LEAVE PT EARLY AM  . thumb surgery Right   . TONSILLECTOMY  1973   Family History  Problem Relation Age of Onset  . Cancer Father        Abdomen with mastasis  . Cancer Mother   . Heart disease Mother   . Kidney disease Neg Hx   . Bladder Cancer Neg Hx   . Prostate cancer Neg Hx   . Kidney cancer Neg Hx    Social History   Tobacco Use  . Smoking status: Former Smoker    Years: 25.00    Types: Cigarettes    Last attempt to quit: 11/16/2012    Years since quitting: 4.9  . Smokeless tobacco: Never Used  . Tobacco comment: occasional use  Substance Use Topics  . Alcohol use: No    Alcohol/week: 0.0 oz  . Drug use: No      Review  of Systems Per HPI    Objective:   Physical Exam Physical Exam  Constitutional: She is oriented to person, place, and time. She appears well-developed and well-nourished. No distress.  HENT:  Head: Normocephalic and atraumatic.  Cardiovascular: Normal rate, regular rhythm and normal heart sounds.   Pulmonary/Chest: Effort normal and breath sounds normal.  Abdominal: Soft. She exhibits no distension. There is no tenderness. There is no rebound, no guarding and no CVA tenderness.  Neurological: She is alert and oriented to person, place, and time.  Skin: Skin is warm and dry. She is not diaphoretic.  Psychiatric: She has a normal mood and affect. Her behavior is normal. Judgment and thought content normal.  Vitals reviewed.  BP 108/60  (BP Location: Right Arm, Patient Position: Sitting, Cuff Size: Normal)   Pulse 97   Temp 99.7 F (37.6 C) (Oral)   Wt 208 lb (94.3 kg)   LMP 08/23/2014   SpO2 97%   BMI 30.72 kg/m  Wt Readings from Last 3 Encounters:  11/03/17 208 lb (94.3 kg)  11/01/17 207 lb (93.9 kg)  10/26/17 211 lb (95.7 kg)   Results for orders placed or performed in visit on 11/03/17  POCT urinalysis dipstick  Result Value Ref Range   Color, UA ORANGE    Clarity, UA CLEAR    Glucose, UA NEG    Bilirubin, UA 1+    Ketones, UA NEG    Spec Grav, UA 1.010 1.010 - 1.025   Blood, UA 10    pH, UA 6.0 5.0 - 8.0   Protein, UA NEG    Urobilinogen, UA 0.2 0.2 or 1.0 E.U./dL   Nitrite, UA POS    Leukocytes, UA Trace (A) Negative   Appearance CLEAR    Odor n/a        Assessment & Plan:  1. Cystitis - Provided written and verbal information regarding diagnosis and treatment. - RTC/ER precautions reviewed - nitrofurantoin, macrocrystal-monohydrate, (MACROBID) 100 MG capsule; Take 1 capsule (100 mg total) by mouth 2 (two) times daily.  Dispense: 20 capsule; Refill: 0 - POCT urinalysis dipstick - Urine Culture   Clarene Reamer, FNP-BC  [facility]  11/04/2017 6:33 AM

## 2017-11-03 NOTE — Patient Instructions (Signed)

## 2017-11-04 ENCOUNTER — Ambulatory Visit: Payer: Self-pay | Admitting: Primary Care

## 2017-11-04 ENCOUNTER — Encounter: Payer: Self-pay | Admitting: Family Medicine

## 2017-11-04 ENCOUNTER — Encounter: Payer: Self-pay | Admitting: Urology

## 2017-11-04 LAB — URINE CULTURE
MICRO NUMBER: 81428255
Result:: NO GROWTH
SPECIMEN QUALITY:: ADEQUATE

## 2017-11-05 ENCOUNTER — Encounter: Payer: Self-pay | Admitting: Primary Care

## 2017-11-05 ENCOUNTER — Encounter: Payer: Self-pay | Admitting: Family Medicine

## 2017-11-08 ENCOUNTER — Ambulatory Visit: Payer: Self-pay | Admitting: Primary Care

## 2017-11-09 ENCOUNTER — Encounter: Payer: Self-pay | Admitting: Primary Care

## 2017-11-19 ENCOUNTER — Encounter: Payer: Self-pay | Admitting: Primary Care

## 2017-11-22 ENCOUNTER — Ambulatory Visit (INDEPENDENT_AMBULATORY_CARE_PROVIDER_SITE_OTHER): Payer: PPO | Admitting: Family Medicine

## 2017-11-22 ENCOUNTER — Encounter: Payer: Self-pay | Admitting: Family Medicine

## 2017-11-22 ENCOUNTER — Ambulatory Visit (INDEPENDENT_AMBULATORY_CARE_PROVIDER_SITE_OTHER)
Admission: RE | Admit: 2017-11-22 | Discharge: 2017-11-22 | Disposition: A | Discharge status: Home / Self Care | Payer: PPO | Source: Ambulatory Visit | Attending: Family Medicine | Admitting: Family Medicine

## 2017-11-22 ENCOUNTER — Other Ambulatory Visit: Payer: Self-pay

## 2017-11-22 VITALS — BP 110/70 | HR 98 | Temp 99.3°F | Ht 69.0 in | Wt 210.5 lb

## 2017-11-22 DIAGNOSIS — M25551 Pain in right hip: Secondary | ICD-10-CM | POA: Diagnosis not present

## 2017-11-22 DIAGNOSIS — Q6589 Other specified congenital deformities of hip: Secondary | ICD-10-CM | POA: Diagnosis not present

## 2017-11-22 DIAGNOSIS — S79911A Unspecified injury of right hip, initial encounter: Secondary | ICD-10-CM | POA: Diagnosis not present

## 2017-11-22 IMAGING — DX DG HIP (WITH OR WITHOUT PELVIS) 2-3V*R*
3 series · 3 of 3 positions shown · non-contrast
Comparison: Hip radiograph [DATE]

CLINICAL DATA: Right hip pain after shoveling snow.

EXAM:
DG HIP (WITH OR WITHOUT PELVIS) 2-3V RIGHT

[pelvis ap]
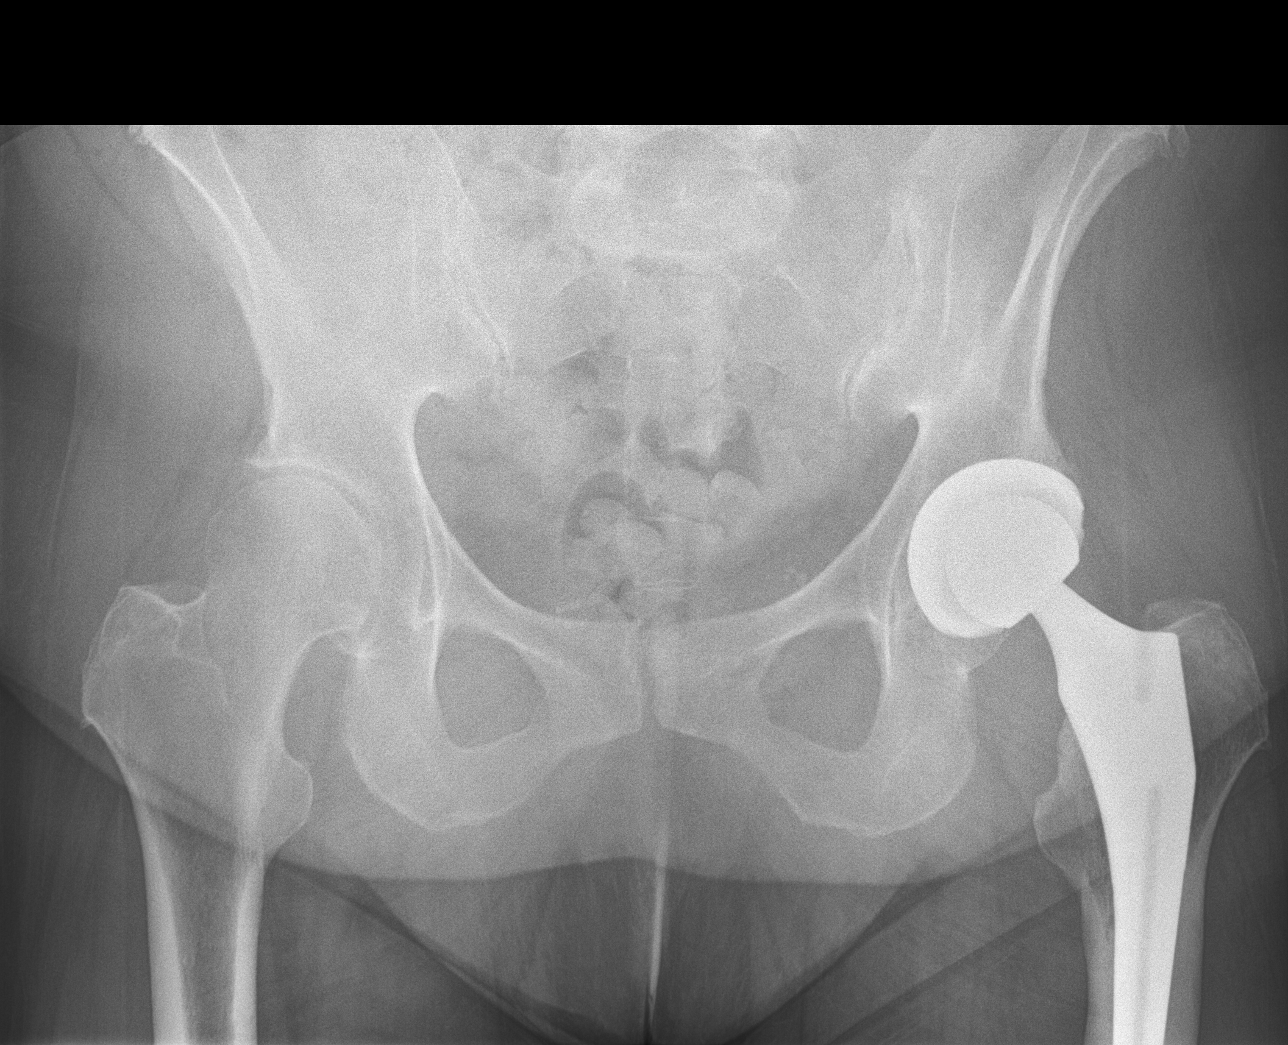

[hip ap]
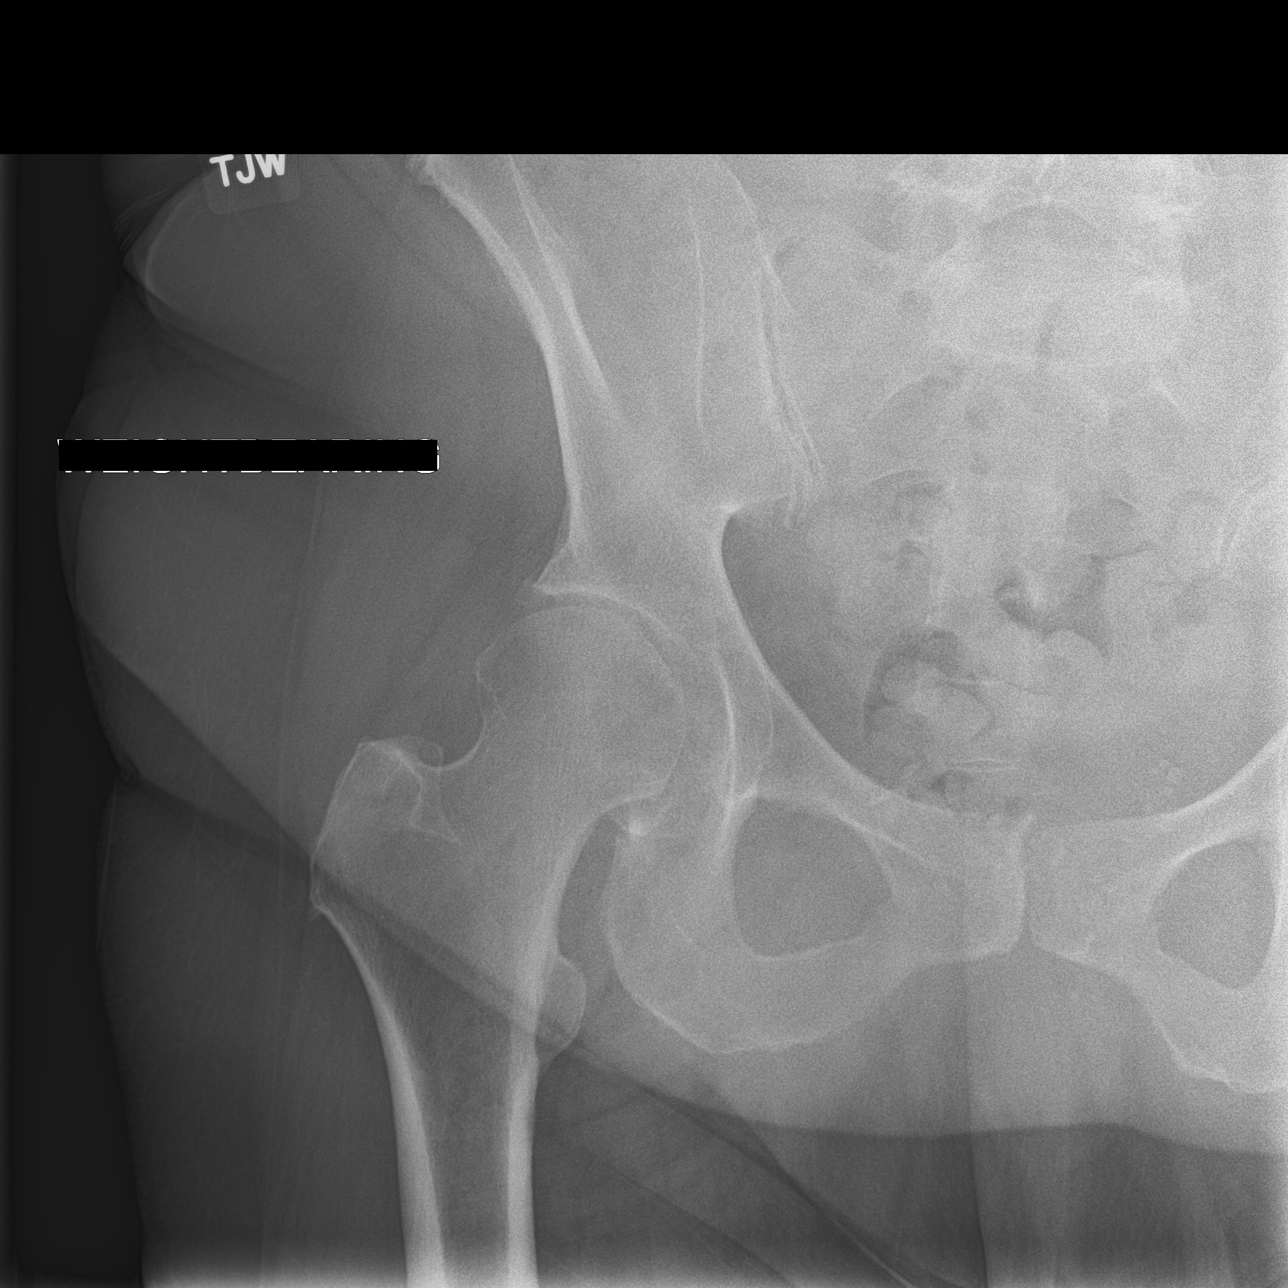

[hip lat]
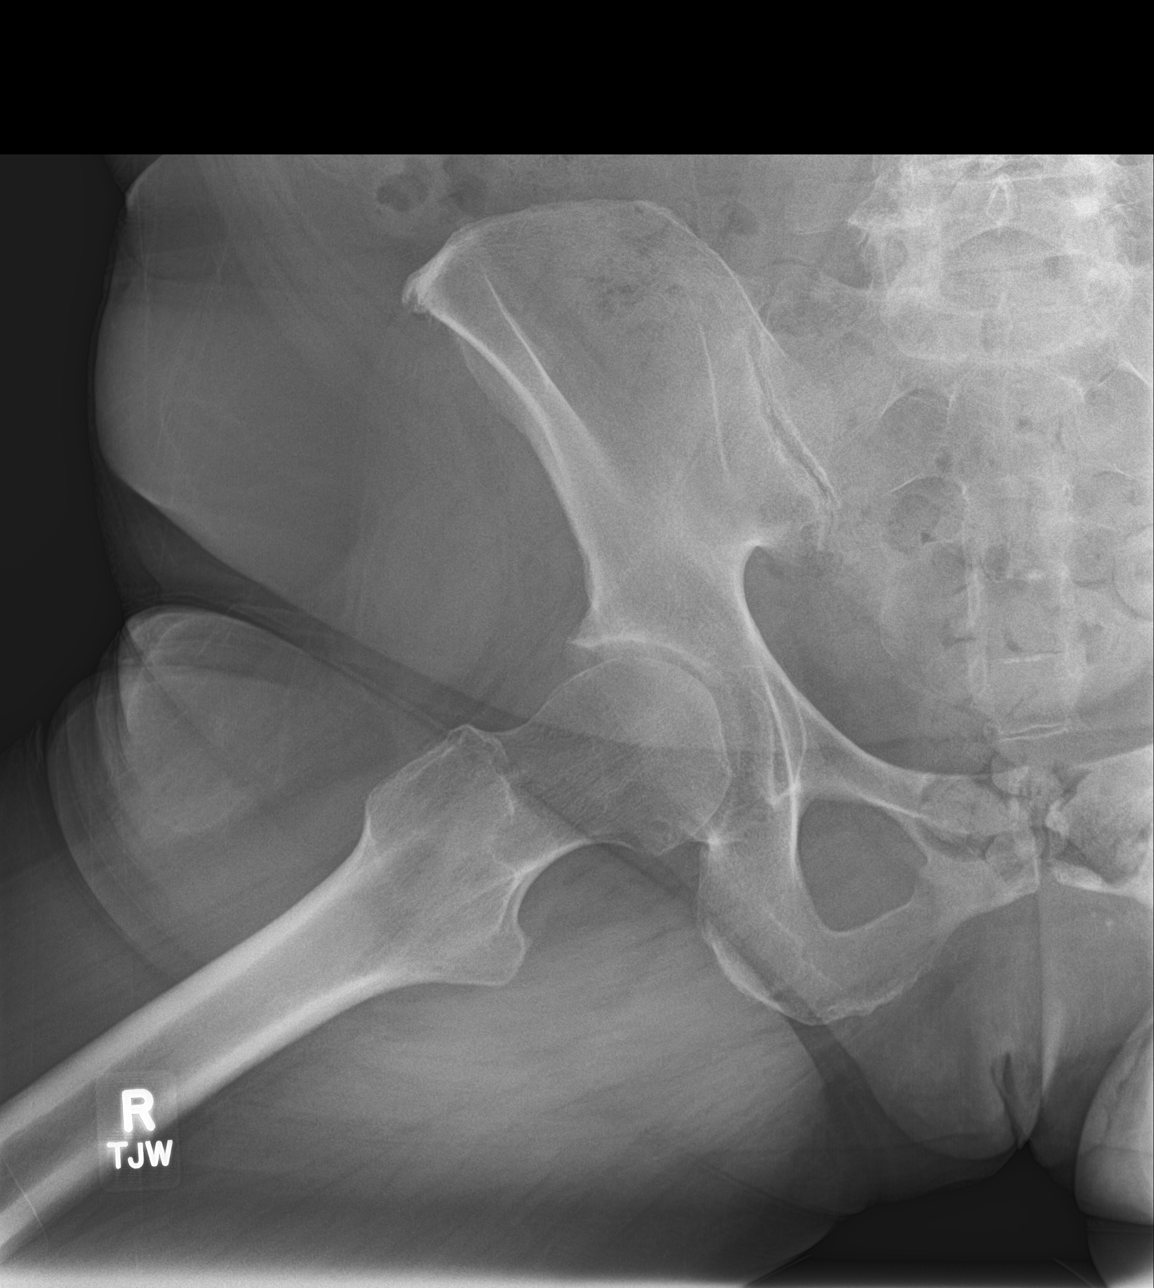

[3 of 3 positions shown; findings below may reference images not displayed]

FINDINGS: Normal appearance of the visualized portion of the left total hip
arthroplasty. There is no fracture or dislocation of the right hip.
Mild spurring at the superior acetabular margin.
IMPRESSION: No fracture or dislocation of the right hip.

## 2017-11-22 NOTE — Progress Notes (Signed)
Dr. Frederico Hamman T. Faelyn Sigler, MD, [facility] [place], [address] Phone: [phone] Fax: [phone]  11/22/2017  Patient: Emily Moon, MRN: [id number], DOB: 1961-07-07, 57 y.o.  Primary Physician:  [name], NP   Chief Complaint  Patient presents with  . Hip Pain    Hurt Shoveling Snow   Subjective:   Emily Moon is a 57 y.o. very pleasant female patient who presents with the following:  R hip: Very pleasant lady with a history of hip dysplasia bilaterally, status post left total hip arthroplasty, chronic pain syndrome as well as MS who presents with right-sided hip pain.  She started to get some hip clicking and mechanical symptoms after she was shoveling a great deal of snow after the recent snowstorm.  She has not really having any significant pain, she is ambulating without any difficulty.  She does take Norco multiple times a day for pain.  Past Medical History, Surgical History, Social History, Family History, Problem List, Medications, and Allergies have been reviewed and updated if relevant.  Patient Active Problem List   Diagnosis Date Noted  . Bipolar disorder ([place]) 05/21/2014    Priority: High  . MULTIPLE SCLEROSIS, PROGRESSIVE/RELAPSING 08/28/2010    Priority: High  . Chronic pain syndrome 01/07/2011    Priority: Medium  . Cough 03/30/2017  . Urinary frequency 03/30/2017  . B12 deficiency 05/22/2016  . Dizziness 03/16/2016  . Medicare annual wellness visit, subsequent 10/25/2015  . Back pain 10/31/2014  . 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 Insomnia 09/01/2011  . Obesity 05/04/2011  . HLD (hyperlipidemia) 01/20/2011  . SMOKER 01/20/2011  . RENAL CALCULUS, RECURRENT 11/13/2010  . GERD 08/28/2010  . Osteoarthritis 08/28/2010  . NEPHROLITHIASIS, HX OF 08/28/2010    Past  Medical History:  Diagnosis Date  . Arthritis    osteo  . Asthma   . Bipolar disorder ([place]) 05/21/14  . Cataracts, bilateral   . DDD (degenerative disc disease), cervical    also back  . Depression   . Dizziness    Positional  . Edema    feet/legs  . Fibromyalgia syndrome   . Fungal infection    Finger nails  . GERD (gastroesophageal reflux disease)   . Gout   . Headache    seasonal allergies  . Heart palpitations   . 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 Hypercholesterolemia   . Multiple sclerosis ([facility])    weakness  . Nephrolithiasis    kidney stones  . Osteoporosis    osteoarthritis  . Pneumonia   . PONV (postoperative nausea and vomiting)    no problem after cataract surgery  . Psoriasis   . Renal stone   . Shortness of breath dyspnea    wheezing  . Sleep apnea 2012   sleep study / slight, no interventions  . Urinary frequency     Past Surgical History:  Procedure Laterality Date  . CATARACT EXTRACTION W/PHACO Left 05/21/2015   Procedure: CATARACT EXTRACTION PHACO AND INTRAOCULAR LENS PLACEMENT (IOC);  Surgeon: Birder Robson, MD;  Location: [facility] OR[facility];  Service: Ophthalmology;  Laterality: Left;  [place] 00:35 AP% 22.9 CDE 8.11 fluid pack lot #[id number]  . CATARACT EXTRACTION W/PHACO Right 06/04/2015   Procedure: CATARACT EXTRACTION PHACO AND INTRAOCULAR LENS PLACEMENT (IOC);  Surgeon: Birder Robson, MD;  Location: [facility];  Service: Ophthalmology;  Laterality: Right;  US:00:48 AP%: 10.5 CDE:5.08 Fluid lot #[id number]  . CYSTOSCOPY/URETEROSCOPY/HOLMIUM LASER/STENT PLACEMENT Bilateral 09/22/2016   Procedure: CYSTOSCOPY/URETEROSCOPY/HOLMIUM LASER/STENT PLACEMENT;  Surgeon: Hollice Espy, MD;  Location: [facility];  Service: Urology;  Laterality: Bilateral;  . EYE SURGERY  2015   tissue biopsy  . FOOT SURGERY  2015  . JOINT REPLACEMENT Left 2013   hip replacement  . LITHOTRIPSY    . PTOSIS REPAIR Bilateral 02/18/2016   Procedure: BILATERAL PTOSIS REPAIR UPPER  EYELIDS;  Surgeon: Karle Starch, MD;  Location: [facility];  Service: Ophthalmology;  Laterality: Bilateral;  LEAVE PT EARLY AM  . thumb surgery Right   . TONSILLECTOMY  1973    Social History   Socioeconomic History  . Marital status: Divorced    Spouse name: Not on file  . Number of children: 1  . Years of education: Not on file  . Highest education level: Not on file  Social Needs  . Financial resource strain: Not on file  . Food insecurity - worry: Not on file  . Food insecurity - inability: Not on file  . Transportation needs - medical: Not on file  . Transportation needs - non-medical: Not on file  Occupational History  . 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 Smoking status: Former Smoker    Years: 25.00    Types: Cigarettes    Last attempt to quit: 11/16/2012    Years since quitting: 5.0  . Smokeless tobacco: Never Used  . Tobacco comment: occasional use  Substance and Sexual Activity  . Alcohol use: No    Alcohol/week: 0.0 oz  . Drug use: No  . Sexual activity: No  Other Topics Concern  . Not on file  Social History Narrative  . Not on file    Family History  Problem Relation Age of Onset  . Cancer Father        Abdomen with mastasis  . Cancer Mother   . Heart disease Mother   . Kidney disease Neg Hx   . Bladder Cancer Neg Hx   . Prostate cancer Neg Hx   . Kidney cancer Neg Hx     Allergies  Allergen Reactions  . Albuterol Shortness Of Breath and Other (See Comments)    Makes pt feel jittery/ tacycardic  . Crestor [Rosuvastatin] Other (See Comments)    Joint pain, muscle pain, and hair loss  . Halcion [Triazolam] Other (See Comments)    Dizziness,headaches,bladder problems  . Levaquin [Levofloxacin In D5w] Diarrhea and Itching    Shoulder pain  . Naproxen Sodium Swelling    Patient tolerates in small doses  . Tylenol [Acetaminophen] Swelling    Patient tolerates in small doses  . Cefaclor  Other (See Comments)    Doesn't remember---unsure if actually allergic   . Diclofenac Sodium Other (See Comments)    "made very sick"  . Sulfa Antibiotics Itching    Unsure of reaction possibly itching  . Tramadol Itching and Nausea And Vomiting  . Aripiprazole Other (See Comments)    Muscle tension/cramping  . Ibuprofen Swelling    Patient tolerates in small doses    Medication list reviewed and updated in full in [facility].  GEN: No fevers, chills. Nontoxic. Primarily MSK c/o today. MSK: Detailed in the HPI GI: tolerating PO intake without difficulty Neuro: No numbness, parasthesias, or tingling associated. Otherwise the pertinent positives of the ROS are noted above.   Objective:  BP 110/70   Pulse 98   Temp 99.3 F (37.4 C) (Oral)   Ht 5\' 9"  (1.753 m)   Wt 210 lb 8 oz (95.5 kg)   LMP 08/23/2014   BMI 31.09 kg/m     GEN: WDWN, NAD, Non-toxic, Alert & Oriented x 3 HEENT: Atraumatic, Normocephalic.  Ears and Nose: No external deformity. EXTR: No clubbing/cyanosis/edema NEURO: Normal gait.  PSYCH: Normally interactive. Conversant. Not depressed or anxious appearing.  Calm demeanor.   HIP EXAM: SIDE: R ROM: Abduction, Flexion, Internal and External range of motion: full Pain with terminal IROM and EROM: mild at terminal abd GTB: mild ttp SLR: NEG Knees: No effusion FABER: NT REVERSE FABER: NT, neg Piriformis: NT at direct palpation Str: flexion: 5/5 abduction: 5/5 adduction: 5/5 Strength testing non-tender   Radiology: Dg Hip Unilat With Pelvis 2-3 Views Right  Result Date: 11/22/2017 CLINICAL DATA:  Right hip pain after shoveling snow. EXAM: DG HIP (WITH OR WITHOUT PELVIS) 2-3V RIGHT COMPARISON:  Hip radiograph 04/22/2015 FINDINGS: Normal appearance of the visualized portion of the left total hip arthroplasty. There is no fracture or dislocation of the right hip. Mild spurring at the superior acetabular margin. IMPRESSION: No fracture or dislocation  of the right hip. Electronically Signed   By: Ulyses Jarred M.D.   On: 11/22/2017 14:16     Assessment and Plan:   Right hip pain - Plan: DG HIP UNILAT WITH PELVIS 2-3 VIEWS RIGHT, Ambulatory referral to Physical Therapy  Hip dysplasia, congenital  >25 minutes spent in face to face time with patient, >50% spent in counselling or coordination of care   Exam is reassuring, and her hip films are reassuring.  Audibly, I can hear a mild click or mechanical symptoms, but is not really causing her any pain at all right now.  Tried to reassure her.  My preference would to be fairly conservative with this, and we are going to do some physical therapy for a month or so.  She does have some limitations with an ill sister right now, but she thinks she should be able to do some PT at least 1 day a week and do some home rehab.  Given the event in the snow, it is certainly possible that she created a small cartilage or labral defect, but the real question is what exactly to do about it if anything at all.   Follow-up: Return in about 6 weeks (around 01/03/2018).  No orders of the defined types were placed in this encounter.  Medications Discontinued During This Encounter  Medication Reason  . nitrofurantoin, macrocrystal-monohydrate, (MACROBID) 100 MG capsule Completed Course  . ketorolac (TORADOL) 10 MG tablet Completed Course   Orders Placed This Encounter  Procedures  . DG HIP UNILAT WITH PELVIS 2-3 VIEWS RIGHT  . Ambulatory referral to Physical Therapy    Signed,  Frederico Hamman T. Zyler Hyson, MD   Allergies as of 11/22/2017      Reactions   Albuterol Shortness Of Breath, Other (See Comments)   Makes pt feel jittery/ tacycardic   Crestor [rosuvastatin] Other (See Comments)   Joint pain, muscle pain, and hair loss   Halcion [triazolam] Other (See Comments)   Dizziness,headaches,bladder problems   Levaquin [levofloxacin In D5w] Diarrhea, Itching   Shoulder pain   Naproxen Sodium Swelling    Patient tolerates in small doses   Tylenol [acetaminophen] Swelling   Patient tolerates in small doses   Cefaclor Other (See Comments)   Doesn't remember---unsure if actually allergic  Diclofenac Sodium Other (See Comments)   "made very sick"   Sulfa Antibiotics Itching   Unsure of reaction possibly itching   Tramadol Itching, Nausea And Vomiting   Aripiprazole Other (See Comments)   Muscle tension/cramping   Ibuprofen Swelling   Patient tolerates in small doses      Medication List        Accurate as of 11/22/17 11:59 PM. Always use your most recent med list.          ASPERCREME LIDOCAINE EX Apply 1 application topically 4 (four) times daily as needed (for pain.).   aspirin 325 MG tablet Take 325 mg by mouth every 4 (four) hours as needed for headache.   AZO-TABS 95 MG tablet Generic drug:  phenazopyridine Take 95 mg by mouth 3 (three) times daily as needed for pain.   benzonatate 100 MG capsule Commonly known as:  TESSALON Take 1-2 capsules (100-200 mg total) 3 (three) times daily as needed by mouth for cough.   Biotin 10000 MCG Tabs Take 30,000 mcg by mouth daily.   calcipotriene-betamethasone ointment Commonly known as:  TACLONEX Apply 1 application topically daily as needed (for psorasis).   clobetasol cream 0.05 % Commonly known as:  TEMOVATE Apply 1 application topically 2 (two) times daily.   cyanocobalamin 1000 MCG/ML injection Commonly known as:  (VITAMIN B-12) Inject 1,000 mcg into the muscle every 30 (thirty) days. For 3 Months (June, July, August)   diclofenac sodium 1 % Gel Commonly known as:  VOLTAREN Apply 4 g topically 4 (four) times daily.   docusate sodium 100 MG capsule Commonly known as:  COLACE Take 1 capsule (100 mg total) by mouth 2 (two) times daily.   DULoxetine 20 MG capsule Commonly known as:  CYMBALTA Take 2 capsules (40 mg total) by mouth at bedtime.   HYDROcodone-acetaminophen 10-325 MG tablet Commonly known as:   NORCO Take 1 tablet by mouth every 6 (six) hours as needed for moderate pain or severe pain.   HYDROcodone-acetaminophen 10-325 MG tablet Commonly known as:  NORCO Take 1 tablet by mouth every 6 (six) hours as needed for moderate pain or severe pain.   HYDROcodone-acetaminophen 10-325 MG tablet Commonly known as:  NORCO Take 1 tablet by mouth every 6 (six) hours as needed for moderate pain or severe pain.   hydroquinone 4 % cream 1 APPLICATION TOPICALLY DAILY AS NEEDED FOR BLEMISHES.   hydrOXYzine 50 MG capsule Commonly known as:  VISTARIL 2 q hs   1   prn   lamoTRIgine 150 MG tablet Commonly known as:  LAMICTAL Take 2 tablets (300 mg total) at bedtime by mouth.   levalbuterol 45 MCG/ACT inhaler Commonly known as:  XOPENEX HFA Inhale 1-2 puffs into the lungs every 6 (six) hours as needed for wheezing.   Lysine 1000 MG Tabs Take 2,000-4,000 mg by mouth at bedtime. 2000 mg scheduled at bedtime and patient will take 4000 mg if she has outbreak   Magnesium 500 MG Tabs Take 1 tablet by mouth at bedtime.   neomycin-bacitracin-polymyxin [phone] ointment Apply 1 application topically 4 (four) times daily as needed (for cut/scrapes.).   nicotine 21 mg/24hr patch Commonly known as:  NICODERM CQ - dosed in mg/24 hours Place 1 patch (21 mg total) onto the skin daily.   polyethylene glycol powder powder Commonly known as:  GLYCOLAX/MIRALAX MIX 17 GRAMS (1 CAPFUL) WITH 4-8 OZ OF LIQUID AND TAKE BY MOUTH TWICE DAILY AS NEEDED   temazepam 30 MG capsule Commonly known  as:  RESTORIL TAKE ONE TO TWO CAPSULES BY MOUTH NIGHTLY AT BEDTIME   tiZANidine 4 MG tablet Commonly known as:  ZANAFLEX Take 2 tablets (8 mg total) by mouth every 6 (six) hours as needed for muscle spasms.   Vitamin D3 10000 units capsule Take 10,000 Units by mouth daily.   zolpidem 5 MG tablet Commonly known as:  AMBIEN Take 1 tablet (5 mg total) by mouth at bedtime.

## 2017-11-22 NOTE — Patient Instructions (Signed)

## 2017-11-24 ENCOUNTER — Encounter: Payer: Self-pay | Admitting: Primary Care

## 2017-11-24 ENCOUNTER — Other Ambulatory Visit (HOSPITAL_COMMUNITY)
Admission: RE | Admit: 2017-11-24 | Discharge: 2017-11-24 | Disposition: A | Discharge status: Home / Self Care | Payer: PPO | Source: Ambulatory Visit | Attending: Primary Care | Admitting: Primary Care

## 2017-11-24 ENCOUNTER — Ambulatory Visit (INDEPENDENT_AMBULATORY_CARE_PROVIDER_SITE_OTHER): Payer: PPO | Admitting: Primary Care

## 2017-11-24 VITALS — BP 118/76 | HR 84 | Temp 99.0°F | Ht 69.0 in | Wt 208.0 lb

## 2017-11-24 DIAGNOSIS — Z1239 Encounter for other screening for malignant neoplasm of breast: Secondary | ICD-10-CM

## 2017-11-24 DIAGNOSIS — R350 Frequency of micturition: Secondary | ICD-10-CM | POA: Diagnosis not present

## 2017-11-24 DIAGNOSIS — E538 Deficiency of other specified B group vitamins: Secondary | ICD-10-CM

## 2017-11-24 DIAGNOSIS — G35D Multiple sclerosis, unspecified: Secondary | ICD-10-CM

## 2017-11-24 DIAGNOSIS — Z1231 Encounter for screening mammogram for malignant neoplasm of breast: Secondary | ICD-10-CM

## 2017-11-24 DIAGNOSIS — G894 Chronic pain syndrome: Secondary | ICD-10-CM | POA: Diagnosis not present

## 2017-11-24 DIAGNOSIS — E785 Hyperlipidemia, unspecified: Secondary | ICD-10-CM

## 2017-11-24 DIAGNOSIS — M159 Polyosteoarthritis, unspecified: Secondary | ICD-10-CM | POA: Diagnosis not present

## 2017-11-24 DIAGNOSIS — Z0000 Encounter for general adult medical examination without abnormal findings: Secondary | ICD-10-CM | POA: Diagnosis not present

## 2017-11-24 DIAGNOSIS — Z124 Encounter for screening for malignant neoplasm of cervix: Secondary | ICD-10-CM | POA: Insufficient documentation

## 2017-11-24 DIAGNOSIS — G4700 Insomnia, unspecified: Secondary | ICD-10-CM

## 2017-11-24 DIAGNOSIS — K5909 Other constipation: Secondary | ICD-10-CM | POA: Diagnosis not present

## 2017-11-24 DIAGNOSIS — R7303 Prediabetes: Secondary | ICD-10-CM | POA: Diagnosis not present

## 2017-11-24 DIAGNOSIS — G35 Multiple sclerosis: Secondary | ICD-10-CM | POA: Diagnosis not present

## 2017-11-24 DIAGNOSIS — F3170 Bipolar disorder, currently in remission, most recent episode unspecified: Secondary | ICD-10-CM

## 2017-11-24 LAB — HEPATIC FUNCTION PANEL
ALT: 11 U/L (ref 0–35)
AST: 11 U/L (ref 0–37)
Albumin: 4.4 g/dL (ref 3.5–5.2)
Alkaline Phosphatase: 67 U/L (ref 39–117)
BILIRUBIN DIRECT: 0.1 mg/dL (ref 0.0–0.3)
BILIRUBIN TOTAL: 0.6 mg/dL (ref 0.2–1.2)
TOTAL PROTEIN: 6.8 g/dL (ref 6.0–8.3)

## 2017-11-24 LAB — LIPID PANEL
Cholesterol: 305 mg/dL — ABNORMAL HIGH (ref 0–200)
HDL: 43 mg/dL (ref >39.00)
NONHDL: 262.19
Total CHOL/HDL Ratio: 7
Triglycerides: 208 mg/dL — ABNORMAL HIGH (ref 0.0–149.0)
VLDL: 41.6 mg/dL — AB (ref 0.0–40.0)

## 2017-11-24 LAB — HEMOGLOBIN A1C: Hgb A1c MFr Bld: 5.9 % (ref 4.6–6.5)

## 2017-11-24 LAB — LDL CHOLESTEROL, DIRECT: LDL DIRECT: 238 mg/dL

## 2017-11-24 LAB — VITAMIN B12: VITAMIN B 12: 296 pg/mL (ref 211–911)

## 2017-11-24 MED ORDER — POLYETHYLENE GLYCOL 3350 17 GM/SCOOP PO POWD: ORAL | 0 refills | Status: DC

## 2017-11-24 NOTE — Assessment & Plan Note (Signed)
Managed on Norco for which she takes numerous times daily chronic pain.  PMP aware site reviewed, no suspicious activity.  She is not due for refill today as she has 1 month remaining on her prescriptions.  She will return in 1 month for a 17-month supply.  Urine drug screen up-to-date, due in August 2019.  Continue Norco given history of chronic arthritis coupled with multiple sclerosis.  She does continue to get relief with treatment plan.  Discussed potential side effects of chronic narcotic use, she verbalized understanding.

## 2017-11-24 NOTE — Assessment & Plan Note (Signed)
Following with psychiatry, increased symptoms since her sister has been chronically ill.  She has an upcoming appointment.

## 2017-11-24 NOTE — Progress Notes (Signed)
Subjective:    Patient ID: Emily Moon, female    DOB: [birth year], 57 y.o.   MRN: [id number]  HPI  [name] is a 57 year old female who presents today for complete physical and for chronic pain management visit.  Immunizations: -Tetanus: Unsure. Insurance will not cover.  -Influenza: Completed this season -Pneumonia: Completed Prevnar in 2015, completed Pneumovax in 2016 -Shingles: Out of stock.  Diet: She endorses a fair diet. Breakfast: Skips Lunch: Fast food, pizza Dinner: Skips, junk food, sandwiches Snacks: None Desserts: Candy Beverages: Water, occasional diet soda, some coffee  Exercise: She is not currently exercising.  Eye exam: Completed in 2017 Dental exam: No recent exam. Colonoscopy: Never completed, interested in Cologuard Pap Smear: Completed in 2015. Due today. Mammogram: No recent mammogram. Due.   Review of Systems  Constitutional: Negative for unexpected weight change.  HENT: Negative for rhinorrhea.   Respiratory: Negative for cough and shortness of breath.   Cardiovascular: Negative for chest pain.  Gastrointestinal: Negative for diarrhea.       Chronic opioid-induced constipation  Genitourinary:       Chronic urinary frequency and incontinence, following with urology  Musculoskeletal: Negative for arthralgias and myalgias.       Chronic hip and back pain.  Skin: Negative for rash.  Allergic/Immunologic: Negative for environmental allergies.  Neurological: Negative for dizziness, numbness and headaches.       Denies increased weakness from MS, or recent MS flare.  Psychiatric/Behavioral:       Following with psychiatry, increased anxiety and depression given sister's recent health.       Past Medical History:  Diagnosis Date  . Arthritis    osteo  . Asthma   . Bipolar disorder ([place]) 05/21/14  . Cataracts, bilateral   . DDD (degenerative disc disease), cervical    also back  . Depression   . Dizziness    Positional  . Edema     feet/legs  . Fibromyalgia syndrome   . Fungal infection    Finger nails  . GERD (gastroesophageal reflux disease)   . Gout   . Headache    seasonal allergies  . Heart palpitations   . 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 Hypercholesterolemia   . Multiple sclerosis (HCC)    weakness  . Nephrolithiasis    kidney stones  . Osteoporosis    osteoarthritis  . Pneumonia   . PONV (postoperative nausea and vomiting)    no problem after cataract surgery  . Psoriasis   . Renal stone   . Shortness of breath dyspnea    wheezing  . Sleep apnea 2012   sleep study / slight, no interventions  . Urinary frequency      Social History   Socioeconomic History  . Marital status: Divorced    Spouse name: Not on file  . Number of children: 1  . Years of education: Not on file  . Highest education level: Not on file  Social Needs  . Financial resource strain: Not on file  . Food insecurity - worry: Not on file  . Food insecurity - inability: Not on file  . Transportation needs - medical: Not on file  . Transportation needs - non-medical: Not on file  Occupational History  . 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 Smoking status: Former Smoker    Years: 25.00    Types: Cigarettes    Last attempt to quit: 11/16/2012  Years since quitting: 5.0  . Smokeless tobacco: Never Used  . Tobacco comment: occasional use  Substance and Sexual Activity  . Alcohol use: No    Alcohol/week: 0.0 oz  . Drug use: No  . Sexual activity: No  Other Topics Concern  . Not on file  Social History Narrative  . Not on file    Past Surgical History:  Procedure Laterality Date  . CATARACT EXTRACTION W/PHACO Left 05/21/2015   Procedure: CATARACT EXTRACTION PHACO AND INTRAOCULAR LENS PLACEMENT (IOC);  Surgeon: Birder Robson, MD;  Location: [facility];  Service: Ophthalmology;  Laterality: Left;  [place] 00:35 AP% 22.9 CDE 8.11 fluid pack lot #[id number]  .  CATARACT EXTRACTION W/PHACO Right 06/04/2015   Procedure: CATARACT EXTRACTION PHACO AND INTRAOCULAR LENS PLACEMENT (IOC);  Surgeon: Birder Robson, MD;  Location: [facility];  Service: Ophthalmology;  Laterality: Right;  US:00:48 AP%: 10.5 CDE:5.08 Fluid lot #[id number]  . CYSTOSCOPY/URETEROSCOPY/HOLMIUM LASER/STENT PLACEMENT Bilateral 09/22/2016   Procedure: CYSTOSCOPY/URETEROSCOPY/HOLMIUM LASER/STENT PLACEMENT;  Surgeon: Hollice Espy, MD;  Location: [facility];  Service: Urology;  Laterality: Bilateral;  . EYE SURGERY  2015   tissue biopsy  . FOOT SURGERY  2015  . JOINT REPLACEMENT Left 2013   hip replacement  . LITHOTRIPSY    . PTOSIS REPAIR Bilateral 02/18/2016   Procedure: BILATERAL PTOSIS REPAIR UPPER EYELIDS;  Surgeon: Karle Starch, MD;  Location: [facility];  Service: Ophthalmology;  Laterality: Bilateral;  LEAVE PT EARLY AM  . thumb surgery Right   . TONSILLECTOMY  1973    Family History  Problem Relation Age of Onset  . Cancer Father        Abdomen with mastasis  . Cancer Mother   . Heart disease Mother   . Kidney disease Neg Hx   . Bladder Cancer Neg Hx   . Prostate cancer Neg Hx   . Kidney cancer Neg Hx     Allergies  Allergen Reactions  . Albuterol Shortness Of Breath and Other (See Comments)    Makes pt feel jittery/ tacycardic  . Crestor [Rosuvastatin] Other (See Comments)    Joint pain, muscle pain, and hair loss  . Halcion [Triazolam] Other (See Comments)    Dizziness,headaches,bladder problems  . Levaquin [Levofloxacin In D5w] Diarrhea and Itching    Shoulder pain  . Naproxen Sodium Swelling    Patient tolerates in small doses  . Tylenol [Acetaminophen] Swelling    Patient tolerates in small doses  . Cefaclor Other (See Comments)    Doesn't remember---unsure if actually allergic   . Diclofenac Sodium Other (See Comments)    "made very sick"  . Sulfa Antibiotics Itching    Unsure of reaction possibly itching  . Tramadol Itching and Nausea And  Vomiting  . Aripiprazole Other (See Comments)    Muscle tension/cramping  . Ibuprofen Swelling    Patient tolerates in small doses    Current Outpatient Medications on File Prior to Visit  Medication Sig Dispense Refill  . ASPERCREME LIDOCAINE EX Apply 1 application topically 4 (four) times daily as needed (for pain.).     Marland Kitchen aspirin 325 MG tablet Take 325 mg by mouth every 4 (four) hours as needed for headache.     . benzonatate (TESSALON) 100 MG capsule Take 1-2 capsules (100-200 mg total) 3 (three) times daily as needed by mouth for cough. 40 capsule 0  . Biotin 10000 MCG TABS Take 30,000 mcg by mouth daily.     . calcipotriene-betamethasone (TACLONEX) ointment Apply 1 application  topically daily as needed (for psorasis).     . Cholecalciferol (VITAMIN D3) 10000 units capsule Take 10,000 Units by mouth daily.    . clobetasol cream (TEMOVATE) 7.32 % Apply 1 application topically 2 (two) times daily. 30 g 2  . cyanocobalamin (,VITAMIN B-12,) 1000 MCG/ML injection Inject 1,000 mcg into the muscle every 30 (thirty) days. For 3 Months (June, July, August)    . diclofenac sodium (VOLTAREN) 1 % GEL Apply 4 g topically 4 (four) times daily. (Patient taking differently: Apply 4 g topically 4 (four) times daily as needed (for pain.). ) 500 g 5  . docusate sodium (COLACE) 100 MG capsule Take 1 capsule (100 mg total) by mouth 2 (two) times daily. (Patient taking differently: Take 100 mg by mouth 2 (two) times daily as needed. ) 60 capsule 0  . DULoxetine (CYMBALTA) 20 MG capsule Take 2 capsules (40 mg total) by mouth at bedtime. 60 capsule 5  . HYDROcodone-acetaminophen (NORCO) 10-325 MG tablet Take 1 tablet by mouth every 6 (six) hours as needed for moderate pain or severe pain. 120 tablet 0  . hydroquinone 4 % cream 1 APPLICATION TOPICALLY DAILY AS NEEDED FOR BLEMISHES.  3  . hydrOXYzine (VISTARIL) 50 MG capsule 2 q hs   1   prn 90 capsule 5  . lamoTRIgine (LAMICTAL) 150 MG tablet Take 2 tablets (300  mg total) at bedtime by mouth. 60 tablet 5  . levalbuterol (XOPENEX HFA) 45 MCG/ACT inhaler Inhale 1-2 puffs into the lungs every 6 (six) hours as needed for wheezing. 1 Inhaler 5  . Lysine 1000 MG TABS Take 2,000-4,000 mg by mouth at bedtime. 2000 mg scheduled at bedtime and patient will take 4000 mg if she has outbreak    . Magnesium 500 MG TABS Take 1 tablet by mouth at bedtime.    Marland Kitchen neomycin-bacitracin-polymyxin (NEOSPORIN) [phone] ointment Apply 1 application topically 4 (four) times daily as needed (for cut/scrapes.).    Marland Kitchen nicotine (NICODERM CQ - DOSED IN MG/24 HOURS) 21 mg/24hr patch Place 1 patch (21 mg total) onto the skin daily. 28 patch 3  . phenazopyridine (AZO-TABS) 95 MG tablet Take 95 mg by mouth 3 (three) times daily as needed for pain.    Marland Kitchen temazepam (RESTORIL) 30 MG capsule TAKE ONE TO TWO CAPSULES BY MOUTH NIGHTLY AT BEDTIME 60 capsule 4  . tiZANidine (ZANAFLEX) 4 MG tablet Take 2 tablets (8 mg total) by mouth every 6 (six) hours as needed for muscle spasms. 720 tablet 3  . zolpidem (AMBIEN) 5 MG tablet Take 1 tablet (5 mg total) by mouth at bedtime. 30 tablet 2  . HYDROcodone-acetaminophen (NORCO) 10-325 MG tablet Take 1 tablet by mouth every 6 (six) hours as needed for moderate pain or severe pain. (Patient not taking: Reported on 11/24/2017) 120 tablet 0   No current facility-administered medications on file prior to visit.     BP 118/76   Pulse 84   Temp 99 F (37.2 C) (Oral)   Ht 5\' 9"  (1.753 m)   Wt 208 lb (94.3 kg)   LMP 08/23/2014   SpO2 98%   BMI 30.72 kg/m    Objective:   Physical Exam  Constitutional: She is oriented to person, place, and time. She appears well-nourished.  HENT:  Right Ear: Tympanic membrane and ear canal normal.  Left Ear: Tympanic membrane and ear canal normal.  Nose: Nose normal.  Mouth/Throat: Oropharynx is clear and moist.  Eyes: Conjunctivae and EOM are normal. Pupils are  equal, round, and reactive to light.  Neck: Neck supple.  No thyromegaly present.  Cardiovascular: Normal rate and regular rhythm.  No murmur heard. Pulmonary/Chest: Effort normal and breath sounds normal. She has no rales.  Abdominal: Soft. Bowel sounds are normal. There is no tenderness.  Musculoskeletal: Normal range of motion.  Bilateral upper and lower extremity strength equal  Lymphadenopathy:    She has no cervical adenopathy.  Neurological: She is alert and oriented to person, place, and time. She has normal reflexes. No cranial nerve deficit.  Skin: Skin is warm and dry. No rash noted.  Psychiatric: She has a normal mood and affect.          Assessment & Plan:

## 2017-11-24 NOTE — Assessment & Plan Note (Signed)
Historically above goal, refuses statin treatment.  Lipid panel pending today.  Strongly encouraged her to improve her diet and start exercising.

## 2017-11-24 NOTE — Assessment & Plan Note (Signed)
Following with psychiatry, managed on temazepam.

## 2017-11-24 NOTE — Assessment & Plan Note (Signed)
Vitamin B12 level pending today.

## 2017-11-24 NOTE — Assessment & Plan Note (Signed)
Continues to follow with neurology through [facility].  Is due for repeat MRI, she will schedule this soon.  Denies recent flares.

## 2017-11-24 NOTE — Patient Instructions (Signed)
Stop by the lab prior to leaving today. I will notify you of your results once received.   Please notify me when you are running low on your hydrocodone prescription.   Start exercising. You should be getting 150 minutes of moderate intensity exercise weekly.  Increase vegetables, fruit, whole grains, water.  Follow up in 3 months for your opioid review.  It was a pleasure to see you today!

## 2017-11-24 NOTE — Assessment & Plan Note (Signed)
Pneumonia vaccinations and influenza vaccination up-to-date.  Declines tetanus given insurance purposes. Pap smear due, completed today. Mammogram due, ordered and is pending. Patient was signed up for Cologuard today for colon cancer screening.  Discussed the importance of a healthy diet and regular exercise in order for weight loss, and to reduce the risk of any potential medical problems. Exam stable. Labs pending.

## 2017-11-24 NOTE — Assessment & Plan Note (Signed)
Managed on Norco for which she takes numerous times daily chronic pain.  PMP aware site reviewed, no suspicious activity.  She is not due for refill today as she has 1 month remaining on her prescriptions.  She will return in 1 month for a 58-month supply.  Urine drug screen up-to-date, due in August 2019.  Continue Norco given history of chronic arthritis coupled with multiple sclerosis.  She does continue to get relief with treatment plan.  Discussed potential side effects of chronic narcotic use, she verbalized understanding.

## 2017-11-24 NOTE — Assessment & Plan Note (Signed)
Following with urology, recently completed PTNS treatment.  She will now begin maintenance PTNS later this month.  Overall has noticed improvement.

## 2017-11-25 ENCOUNTER — Encounter: Payer: Self-pay | Admitting: Primary Care

## 2017-11-25 ENCOUNTER — Other Ambulatory Visit (HOSPITAL_COMMUNITY): Payer: Self-pay

## 2017-11-25 MED ORDER — ZOLPIDEM TARTRATE 10 MG PO TABS: 10.0000 mg | ORAL_TABLET | Freq: Every day | ORAL | 2 refills | Status: DC

## 2017-11-26 ENCOUNTER — Ambulatory Visit (HOSPITAL_COMMUNITY): Payer: Self-pay | Admitting: Psychiatry

## 2017-11-26 LAB — CYTOLOGY - PAP
Diagnosis: NEGATIVE
HPV (WINDOPATH): NOT DETECTED

## 2017-11-29 ENCOUNTER — Ambulatory Visit: Payer: Self-pay | Admitting: Family Medicine

## 2017-12-14 ENCOUNTER — Encounter: Payer: Self-pay | Admitting: Primary Care

## 2017-12-23 ENCOUNTER — Encounter: Payer: Self-pay | Admitting: Primary Care

## 2017-12-28 ENCOUNTER — Other Ambulatory Visit: Payer: Self-pay

## 2017-12-28 ENCOUNTER — Telehealth (HOSPITAL_COMMUNITY): Payer: Self-pay

## 2017-12-28 DIAGNOSIS — G35 Multiple sclerosis: Secondary | ICD-10-CM | POA: Diagnosis not present

## 2017-12-28 MED ORDER — CYANOCOBALAMIN 1000 MCG/ML IJ SOLN: 1000.0000 ug | INTRAMUSCULAR | 0 refills | Status: DC

## 2017-12-28 NOTE — Telephone Encounter (Signed)
Patient called and I scheduled her for tomorrow, she is not doing well since her sister passed

## 2017-12-29 ENCOUNTER — Other Ambulatory Visit (HOSPITAL_COMMUNITY): Payer: Self-pay | Admitting: Psychiatry

## 2017-12-29 ENCOUNTER — Ambulatory Visit (HOSPITAL_COMMUNITY): Payer: PPO | Admitting: Psychiatry

## 2017-12-29 ENCOUNTER — Encounter (HOSPITAL_COMMUNITY): Payer: Self-pay | Admitting: Psychiatry

## 2017-12-29 VITALS — BP 128/80 | HR 110 | Ht 69.0 in | Wt 197.8 lb

## 2017-12-29 DIAGNOSIS — G4700 Insomnia, unspecified: Secondary | ICD-10-CM

## 2017-12-29 DIAGNOSIS — Z87891 Personal history of nicotine dependence: Secondary | ICD-10-CM | POA: Diagnosis not present

## 2017-12-29 DIAGNOSIS — F3162 Bipolar disorder, current episode mixed, moderate: Secondary | ICD-10-CM | POA: Diagnosis not present

## 2017-12-29 MED ORDER — HYDROXYZINE PAMOATE 50 MG PO CAPS: ORAL_CAPSULE | ORAL | 5 refills | Status: DC

## 2017-12-29 MED ORDER — ZOLPIDEM TARTRATE 10 MG PO TABS: 10.0000 mg | ORAL_TABLET | Freq: Every evening | ORAL | 3 refills | Status: DC | PRN

## 2017-12-29 MED ORDER — DULOXETINE HCL 20 MG PO CPEP: 40.0000 mg | ORAL_CAPSULE | Freq: Every day | ORAL | 5 refills | Status: DC

## 2017-12-29 MED ORDER — TRAZODONE HCL 50 MG PO TABS: 50.0000 mg | ORAL_TABLET | Freq: Every day | ORAL | 5 refills | Status: DC

## 2017-12-29 NOTE — Progress Notes (Signed)
Psychiatric Initial Adult Assessment   Patient Identification: Emily Moon MRN:  [id number] Date of Evaluation:  12/29/2017 Referral Source: From the community Chief Complaint:   Today the patient is not doing well. Her sister died one month ago.the patient is having a hard time sleeping. Her appetite is reduced. The patient denies problems with her energy. She is thinking and concentrating fairly well she denies feeling worthless. Patient takes her medicine as prescribed. He could not tolerate the Pristeque and is back on Cymbalta. The patient is taking Restoril for sleep. Patient is starting in therapy in one week.  Visit Diagnos  bipolar disord Anesthesia Post Note  Patient: Emily Moon  Procedure(s) Performed: * No surgery found *  Anesthes  Patient location:   Post pain:   Post assessment:   Last Vitals:  Vitals:   12/29/17 1538  BP: 128/80  Pulse: (!) 110    Post vital signs:   Level of consciousness:   Complications: . Associated Signs/Symptoms: Depression Symptoms:  hopelessness, (Hypo) Manic Symptoms:   Anxiety Symptoms:   Psychotic Symptoms:   PTSD Symptoms:   Past Psychiatric History: The patient was seen in a psychiatric office and takes multiple medications she's not been recently hospitalized.  Previous Psychotropic Medications: Yes   Substance Abuse History in the last 12 months:  No.  Consequences of Substance Abuse: Negative  Past Medical History:  Past Medical History:  Diagnosis Date  . Arthritis    osteo  . Asthma   . Bipolar disorder ([place]) 05/21/14  . Cataracts, bilateral   . DDD (degenerative disc disease), cervical    also back  . Depression   . Dizziness    Positional  . Edema    feet/legs  . Fibromyalgia syndrome   . Fungal infection    Finger nails  . GERD (gastroesophageal reflux disease)   . Gout   . Headache    seasonal allergies  . Heart palpitations   . 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  Hypercholesterolemia   . Multiple sclerosis (HCC)    weakness  . Nephrolithiasis    kidney stones  . Osteoporosis    osteoarthritis  . Pneumonia   . PONV (postoperative nausea and vomiting)    no problem after cataract surgery  . Psoriasis   . Renal stone   . Shortness of breath dyspnea    wheezing  . Sleep apnea 2012   sleep study / slight, no interventions  . Urinary frequency     Past Surgical History:  Procedure Laterality Date  . CATARACT EXTRACTION W/PHACO Left 05/21/2015   Procedure: CATARACT EXTRACTION PHACO AND INTRAOCULAR LENS PLACEMENT (IOC);  Surgeon: Birder Robson, MD;  Location: [facility];  Service: Ophthalmology;  Laterality: Left;  [place] 00:35 AP% 22.9 CDE 8.11 fluid pack lot #[id number]  . CATARACT EXTRACTION W/PHACO Right 06/04/2015   Procedure: CATARACT EXTRACTION PHACO AND INTRAOCULAR LENS PLACEMENT (IOC);  Surgeon: Birder Robson, MD;  Location: [facility];  Service: Ophthalmology;  Laterality: Right;  US:00:48 AP%: 10.5 CDE:5.08 Fluid lot #[id number]  . CYSTOSCOPY/URETEROSCOPY/HOLMIUM LASER/STENT PLACEMENT Bilateral 09/22/2016   Procedure: CYSTOSCOPY/URETEROSCOPY/HOLMIUM LASER/STENT PLACEMENT;  Surgeon: Hollice Espy, MD;  Location: [facility];  Service: Urology;  Laterality: Bilateral;  . EYE SURGERY  2015   tissue biopsy  . FOOT SURGERY  2015  . JOINT REPLACEMENT Left 2013   hip replacement  . LITHOTRIPSY    . PTOSIS REPAIR Bilateral 02/18/2016   Procedure: BILATERAL PTOSIS REPAIR UPPER EYELIDS;  Surgeon: Karle Starch,  MD;  Location: [facility];  Service: Ophthalmology;  Laterality: Bilateral;  LEAVE PT EARLY AM  . thumb surgery Right   . TONSILLECTOMY  1973    Family Psychiatric History:   Family History:  Family History  Problem Relation Age of Onset  . Cancer Father        Abdomen with mastasis  . Cancer Mother   . Heart disease Mother   . Kidney disease Neg Hx   . Bladder Cancer Neg Hx   . Prostate cancer Neg Hx   . Kidney cancer Neg Hx      Social History:   Social History   Socioeconomic History  . Marital status: Divorced    Spouse name: None  . Number of children: 1  . Years of education: None  . Highest education level: None  Social Needs  . Financial resource strain: None  . Food insecurity - worry: None  . Food insecurity - inability: None  . Transportation needs - medical: None  . Transportation needs - non-medical: None  Occupational History  . 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 Smoking status: Former Smoker    Years: 25.00    Types: Cigarettes    Last attempt to quit: 11/16/2012    Years since quitting: 5.1  . Smokeless tobacco: Never Used  . Tobacco comment: occasional use  Substance and Sexual Activity  . Alcohol use: No    Alcohol/week: 0.0 oz  . Drug use: No  . Sexual activity: No  Other Topics Concern  . None  Social History Narrative  . None    Additional Social History:   Allergies:   Allergies  Allergen Reactions  . Albuterol Shortness Of Breath and Other (See Comments)    Makes pt feel jittery/ tacycardic  . Crestor [Rosuvastatin] Other (See Comments)    Joint pain, muscle pain, and hair loss  . Halcion [Triazolam] Other (See Comments)    Dizziness,headaches,bladder problems  . Levaquin [Levofloxacin In D5w] Diarrhea and Itching    Shoulder pain  . Naproxen Sodium Swelling    Patient tolerates in small doses  . Tylenol [Acetaminophen] Swelling    Patient tolerates in small doses  . Cefaclor Other (See Comments)    Doesn't remember---unsure if actually allergic   . Diclofenac Sodium Other (See Comments)    "made very sick"  . Sulfa Antibiotics Itching    Unsure of reaction possibly itching  . Tramadol Itching and Nausea And Vomiting  . Aripiprazole Other (See Comments)    Muscle tension/cramping  . Ibuprofen Swelling    Patient tolerates in small doses    Metabolic Disorder Labs: Lab Results  Component Value Date    HGBA1C 5.9 11/24/2017   No results found for: PROLACTIN Lab Results  Component Value Date   CHOL 305 (H) 11/24/2017   TRIG 208.0 (H) 11/24/2017   HDL 43.00 11/24/2017   CHOLHDL 7 11/24/2017   VLDL 41.6 (H) 11/24/2017   LDLCALC 223 (H) 11/13/2014     Current Medications: Current Outpatient Medications  Medication Sig Dispense Refill  . ASPERCREME LIDOCAINE EX Apply 1 application topically 4 (four) times daily as needed (for pain.).     Marland Kitchen aspirin 325 MG tablet Take 325 mg by mouth every 4 (four) hours as needed for headache.     . benzonatate (TESSALON) 100 MG capsule Take 1-2 capsules (100-200 mg total) 3 (three) times daily as needed by mouth for  cough. 40 capsule 0  . Biotin 10000 MCG TABS Take 30,000 mcg by mouth daily.     . calcipotriene-betamethasone (TACLONEX) ointment Apply 1 application topically daily as needed (for psorasis).     . Cholecalciferol (VITAMIN D3) 10000 units capsule Take 10,000 Units by mouth daily.    . clobetasol cream (TEMOVATE) 6.96 % Apply 1 application topically 2 (two) times daily. 30 g 2  . cyanocobalamin (,VITAMIN B-12,) 1000 MCG/ML injection Inject 1 mL (1,000 mcg total) into the muscle every 30 (thirty) days. For 6 Months from 11/2017. 1 mL 0  . diclofenac sodium (VOLTAREN) 1 % GEL Apply 4 g topically 4 (four) times daily. (Patient taking differently: Apply 4 g topically 4 (four) times daily as needed (for pain.). ) 500 g 5  . docusate sodium (COLACE) 100 MG capsule Take 1 capsule (100 mg total) by mouth 2 (two) times daily. (Patient taking differently: Take 100 mg by mouth 2 (two) times daily as needed. ) 60 capsule 0  . DULoxetine (CYMBALTA) 20 MG capsule Take 2 capsules (40 mg total) by mouth at bedtime. 60 capsule 5  . HYDROcodone-acetaminophen (NORCO) 10-325 MG tablet Take 1 tablet by mouth every 6 (six) hours as needed for moderate pain or severe pain. 120 tablet 0  . hydroquinone 4 % cream 1 APPLICATION TOPICALLY DAILY AS NEEDED FOR BLEMISHES.  3   . hydrOXYzine (VISTARIL) 50 MG capsule 1   q  Noon  1  q 6:00 pm  1  prn 90 capsule 5  . lamoTRIgine (LAMICTAL) 150 MG tablet Take 2 tablets (300 mg total) at bedtime by mouth. 60 tablet 5  . levalbuterol (XOPENEX HFA) 45 MCG/ACT inhaler Inhale 1-2 puffs into the lungs every 6 (six) hours as needed for wheezing. 1 Inhaler 5  . Lysine 1000 MG TABS Take 2,000-4,000 mg by mouth at bedtime. 2000 mg scheduled at bedtime and patient will take 4000 mg if she has outbreak    . Magnesium 500 MG TABS Take 1 tablet by mouth at bedtime.    Marland Kitchen neomycin-bacitracin-polymyxin (NEOSPORIN) [phone] ointment Apply 1 application topically 4 (four) times daily as needed (for cut/scrapes.).    Marland Kitchen nicotine (NICODERM CQ - DOSED IN MG/24 HOURS) 21 mg/24hr patch Place 1 patch (21 mg total) onto the skin daily. 28 patch 3  . phenazopyridine (AZO-TABS) 95 MG tablet Take 95 mg by mouth 3 (three) times daily as needed for pain.    . polyethylene glycol powder (GLYCOLAX/MIRALAX) powder MIX 17 GRAMS (1 CAPFUL) WITH 4-8 OZ OF LIQUID AND TAKE BY MOUTH TWICE DAILY AS NEEDED 527 g 0  . temazepam (RESTORIL) 30 MG capsule TAKE ONE TO TWO CAPSULES BY MOUTH NIGHTLY AT BEDTIME 60 capsule 4  . tiZANidine (ZANAFLEX) 4 MG tablet Take 2 tablets (8 mg total) by mouth every 6 (six) hours as needed for muscle spasms. 720 tablet 3  . zolpidem (AMBIEN) 10 MG tablet Take 1 tablet (10 mg total) by mouth at bedtime. 30 tablet 2  . HYDROcodone-acetaminophen (NORCO) 10-325 MG tablet Take 1 tablet by mouth every 6 (six) hours as needed for moderate pain or severe pain. (Patient not taking: Reported on 12/29/2017) 120 tablet 0  . traZODone (DESYREL) 50 MG tablet Take 1 tablet (50 mg total) by mouth at bedtime. 1  qhs  For 3 days if fails the 2  qhs 60 tablet 5  . zolpidem (AMBIEN) 10 MG tablet Take 1 tablet (10 mg total) by mouth at bedtime as needed  for sleep. 30 tablet 3   No current facility-administered medications for this visit.      Neurologic: Headache: No Seizure: No Paresthesias:No  Musculoskeletal: Strength & Muscle Tone: within normal limits Gait & Station: normal Patient leans: N/A  Psychiatric Specialty Exam: ROS  Blood pressure 128/80, pulse (!) 110, height 5\' 9"  (1.753 m), weight 197 lb 12.8 oz (89.7 kg), last menstrual period 08/23/2014.Body mass index is 29.21 kg/m.  General Appearance: Casual  Eye Contact:  Good  Speech:  Clear and Coherent  Volume:  Normal  Mood:  Negative  Affect:  Appropriate  Thought Process:  Goal Directed  Orientation:  NA  Thought Content:  Logical  Suicidal Thoughts:  No  Homicidal Thoughts:  No  Memory:  Negative  Judgement:  Good  Insight:  Good  Psychomotor Activity:  Normal  Concentration:    Recall:    Fund of Knowledge:Good  Language: Good  Akathisia:  No  Handed:  Right  AIMS (if indicated):    Assets:  Desire for Improvement  ADL's:  Intact  Cognition: WNL  Sleep:      2/13/20194:13 PM    At this time the patient will continue taking Cymbalta 40 mg taking Restoril 15 mg 2 at nightand will begin on trazodone 50 mg taking one or 2 at night. She will entered therapy with a new therapist. The patient is stable in many ways. Her tests are to review the death of her sister. Unfortunately is a very traumatic endings. He did not want this to happen like this in her sisteris not on her to die in this way.the patient is drinking no alcohol. She is not suicidal. Actually is functioning fairly well she is resilient and her away. Her MS is causing some problems with some facial twitching which I suspect is more likely related to anxiety than anything. This patient return to see me in approximately 7 weeks.

## 2017-12-30 ENCOUNTER — Ambulatory Visit (INDEPENDENT_AMBULATORY_CARE_PROVIDER_SITE_OTHER): Payer: PPO

## 2017-12-30 DIAGNOSIS — E538 Deficiency of other specified B group vitamins: Secondary | ICD-10-CM

## 2017-12-30 MED ORDER — CYANOCOBALAMIN 1000 MCG/ML IJ SOLN
1000.0000 ug | Freq: Once | INTRAMUSCULAR | Status: AC
  Administered 2017-12-30: 1000 ug via INTRAMUSCULAR

## 2017-12-31 ENCOUNTER — Ambulatory Visit: Payer: PPO | Admitting: Psychology

## 2017-12-31 DIAGNOSIS — F3181 Bipolar II disorder: Secondary | ICD-10-CM | POA: Diagnosis not present

## 2018-01-04 ENCOUNTER — Ambulatory Visit: Payer: PPO | Admitting: Psychology

## 2018-01-04 DIAGNOSIS — F3189 Other bipolar disorder: Secondary | ICD-10-CM | POA: Diagnosis not present

## 2018-01-06 ENCOUNTER — Encounter: Payer: Self-pay | Admitting: Primary Care

## 2018-01-06 DIAGNOSIS — M1990 Unspecified osteoarthritis, unspecified site: Secondary | ICD-10-CM

## 2018-01-07 ENCOUNTER — Ambulatory Visit (HOSPITAL_COMMUNITY): Payer: Self-pay | Admitting: Psychiatry

## 2018-01-07 MED ORDER — HYDROCODONE-ACETAMINOPHEN 10-325 MG PO TABS: 1.0000 | ORAL_TABLET | Freq: Four times a day (QID) | ORAL | 0 refills | Status: DC | PRN

## 2018-01-07 NOTE — Telephone Encounter (Signed)
Will you please call pharmacy to confirm last three fill dates for hydrocodone? No documentation in PMP aware since October 2018.

## 2018-01-07 NOTE — Telephone Encounter (Signed)
Dates are 09/21/2017, 10/19/2018, 11/16/2017, and 12/14/2017

## 2018-01-10 ENCOUNTER — Other Ambulatory Visit: Payer: Self-pay | Admitting: Primary Care

## 2018-01-10 DIAGNOSIS — M62838 Other muscle spasm: Secondary | ICD-10-CM

## 2018-01-10 NOTE — Telephone Encounter (Signed)
Ok to refill? Electronically refill request for tiZANidine (ZANAFLEX) 4 MG tablet  Last prescribed on 03/18/2017. Last seen on 11/24/2017

## 2018-01-11 NOTE — Telephone Encounter (Signed)
Refill sent to pharmacy.   

## 2018-01-12 ENCOUNTER — Ambulatory Visit: Payer: PPO | Admitting: Psychology

## 2018-01-12 DIAGNOSIS — F3189 Other bipolar disorder: Secondary | ICD-10-CM | POA: Diagnosis not present

## 2018-01-17 ENCOUNTER — Ambulatory Visit: Payer: PPO | Admitting: Psychology

## 2018-01-17 DIAGNOSIS — F3189 Other bipolar disorder: Secondary | ICD-10-CM | POA: Diagnosis not present

## 2018-01-24 ENCOUNTER — Ambulatory Visit: Payer: PPO | Admitting: Psychology

## 2018-02-05 ENCOUNTER — Encounter: Payer: Self-pay | Admitting: Primary Care

## 2018-02-05 DIAGNOSIS — M1990 Unspecified osteoarthritis, unspecified site: Secondary | ICD-10-CM

## 2018-02-07 ENCOUNTER — Telehealth: Payer: Self-pay | Admitting: Primary Care

## 2018-02-07 DIAGNOSIS — M1990 Unspecified osteoarthritis, unspecified site: Secondary | ICD-10-CM

## 2018-02-07 MED ORDER — HYDROCODONE-ACETAMINOPHEN 10-325 MG PO TABS: 1.0000 | ORAL_TABLET | Freq: Four times a day (QID) | ORAL | 0 refills | Status: DC | PRN

## 2018-02-07 NOTE — Telephone Encounter (Signed)
Noted, new Rx sent.

## 2018-02-07 NOTE — Telephone Encounter (Signed)
I spoke with Emily Moon at [facility] in [place]; when received hydrocodone apap rx Dr Rometta Emery name and DEA # was not on rx. Emily Moon will destroy the rx and request new rx with Dr Rometta Emery name and DEA# included in the sig.Please advise.

## 2018-02-07 NOTE — Telephone Encounter (Signed)
Copied from [place] [id number]. Topic: Quick Communication - See Telephone Encounter >> Feb 07, 2018  1:29 PM Lolita Rieger, RMA wrote: CRM for notification. See Telephone encounter for: 02/07/18  [facility] [id number] [place] [address], [place] [phone] (Phone) [phone] (Fax)     Called and needs new script from  Dr. Diona Browner can not accept it from  NP [facility] HYDROcodone-acetaminophen [facility]) 10-325 MG tablet [[id number]]

## 2018-02-09 ENCOUNTER — Encounter (HOSPITAL_COMMUNITY): Payer: Self-pay | Admitting: Psychiatry

## 2018-02-09 ENCOUNTER — Ambulatory Visit (HOSPITAL_COMMUNITY): Payer: PPO | Admitting: Psychiatry

## 2018-02-09 VITALS — BP 126/80 | HR 101 | Ht 69.0 in | Wt 198.0 lb

## 2018-02-09 DIAGNOSIS — F3162 Bipolar disorder, current episode mixed, moderate: Secondary | ICD-10-CM | POA: Diagnosis not present

## 2018-02-09 DIAGNOSIS — Z87891 Personal history of nicotine dependence: Secondary | ICD-10-CM

## 2018-02-09 MED ORDER — ZOLPIDEM TARTRATE 10 MG PO TABS: 10.0000 mg | ORAL_TABLET | Freq: Every day | ORAL | 2 refills | Status: DC

## 2018-02-09 MED ORDER — LAMOTRIGINE 150 MG PO TABS: 300.0000 mg | ORAL_TABLET | Freq: Every day | ORAL | 5 refills | Status: DC

## 2018-02-09 MED ORDER — DULOXETINE HCL 20 MG PO CPEP: 40.0000 mg | ORAL_CAPSULE | Freq: Every day | ORAL | 5 refills | Status: DC

## 2018-02-09 MED ORDER — TEMAZEPAM 30 MG PO CAPS: ORAL_CAPSULE | ORAL | 4 refills | Status: DC

## 2018-02-09 MED ORDER — HYDROXYZINE PAMOATE 50 MG PO CAPS: ORAL_CAPSULE | ORAL | 5 refills | Status: DC

## 2018-02-09 NOTE — Progress Notes (Signed)
Psychiatric Initial Adult Assessment   Patient Identification: Emily Moon MRN:  [id number] Date of Evaluation:  02/09/2018 Referral Source: From the community Chief Complaint:   Visit Diagnos  bipolar disord Today the patient is doing fairly well she'll be moving into a new part seen. He only thing she takes for this 1o0 mg. Her neurologist wants her to take 600 mg.patient's mood is stable. She denies being depressed. She does not appear that irritable. The patient is working wonderful. Generally she sleeping very well with the regime that she's taking Ambien Restoril and trazodone. She sleeps well has good energy and can concentrate fairly well. She is not psychotic. She drinks no alcohol uses no drugs medically she feels fairly stable. Financially she is reasonably stable.Overall patient is fairly stable. Anesthesia Post Note  Patient: Emily Moon  Procedure(s) Performed: * No surgery found *  Anesthes  Patient location:   Post pain:   Post assessment:   Last Vitals:  Vitals:   02/09/18 1430  BP: 126/80  Pulse: (!) 101    Post vital signs:   Level of consciousness:   Complications: . Associated Signs/Symptoms: Depression Symptoms:  hopelessness, (Hypo) Manic Symptoms:   Anxiety Symptoms:   Psychotic Symptoms:   PTSD Symptoms:   Past Psychiatric History: The patient was seen in a psychiatric office and takes multiple medications she's not been recently hospitalized.  Previous Psychotropic Medications: Yes   Substance Abuse History in the last 12 months:  No.  Consequences of Substance Abuse: Negative  Past Medical History:  Past Medical History:  Diagnosis Date  . Arthritis    osteo  . Asthma   . Bipolar disorder ([place]) 05/21/14  . Cataracts, bilateral   . DDD (degenerative disc disease), cervical    also back  . Depression   . Dizziness    Positional  . Edema    feet/legs  . Fibromyalgia syndrome   . Fungal infection    Finger nails   . GERD (gastroesophageal reflux disease)   . Gout   . Headache    seasonal allergies  . Heart palpitations   . 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 Hypercholesterolemia   . Multiple sclerosis (HCC)    weakness  . Nephrolithiasis    kidney stones  . Osteoporosis    osteoarthritis  . Pneumonia   . PONV (postoperative nausea and vomiting)    no problem after cataract surgery  . Psoriasis   . Renal stone   . Shortness of breath dyspnea    wheezing  . Sleep apnea 2012   sleep study / slight, no interventions  . Urinary frequency     Past Surgical History:  Procedure Laterality Date  . CATARACT EXTRACTION W/PHACO Left 05/21/2015   Procedure: CATARACT EXTRACTION PHACO AND INTRAOCULAR LENS PLACEMENT (IOC);  Surgeon: Birder Robson, MD;  Location: [facility];  Service: Ophthalmology;  Laterality: Left;  [place] 00:35 AP% 22.9 CDE 8.11 fluid pack lot #[id number]  . CATARACT EXTRACTION W/PHACO Right 06/04/2015   Procedure: CATARACT EXTRACTION PHACO AND INTRAOCULAR LENS PLACEMENT (IOC);  Surgeon: Birder Robson, MD;  Location: [facility];  Service: Ophthalmology;  Laterality: Right;  US:00:48 AP%: 10.5 CDE:5.08 Fluid lot #[id number]  . CYSTOSCOPY/URETEROSCOPY/HOLMIUM LASER/STENT PLACEMENT Bilateral 09/22/2016   Procedure: CYSTOSCOPY/URETEROSCOPY/HOLMIUM LASER/STENT PLACEMENT;  Surgeon: Hollice Espy, MD;  Location: [facility];  Service: Urology;  Laterality: Bilateral;  . EYE SURGERY  2015   tissue biopsy  . FOOT SURGERY  2015  . JOINT REPLACEMENT Left 2013  hip replacement  . LITHOTRIPSY    . PTOSIS REPAIR Bilateral 02/18/2016   Procedure: BILATERAL PTOSIS REPAIR UPPER EYELIDS;  Surgeon: Karle Starch, MD;  Location: [facility];  Service: Ophthalmology;  Laterality: Bilateral;  LEAVE PT EARLY AM  . thumb surgery Right   . TONSILLECTOMY  1973    Family Psychiatric History:   Family History:  Family History  Problem Relation Age of Onset  . Cancer Father        Abdomen with  mastasis  . Cancer Mother   . Heart disease Mother   . Kidney disease Neg Hx   . Bladder Cancer Neg Hx   . Prostate cancer Neg Hx   . Kidney cancer Neg Hx     Social History:   Social History   Socioeconomic History  . Marital status: Divorced    Spouse name: Not on file  . Number of children: 1  . Years of education: Not on file  . Highest education level: Not on file  Occupational History  . Occupation: Therapist, art Rep at [employer]  . Financial resource strain: Not on file  . Food insecurity:    Worry: Not on file    Inability: Not on file  . Transportation needs:    Medical: Not on file    Non-medical: Not on file  Tobacco Use  . Smoking status: Former Smoker    Years: 25.00    Types: Cigarettes    Last attempt to quit: 11/16/2012    Years since quitting: 5.2  . Smokeless tobacco: Never Used  . Tobacco comment: occasional use  Substance and Sexual Activity  . Alcohol use: No    Alcohol/week: 0.0 oz  . Drug use: No    Types: Methylphenidate  . Sexual activity: Never  Lifestyle  . Physical activity:    Days per week: Not on file    Minutes per session: Not on file  . Stress: Not on file  Relationships  . Social connections:    Talks on phone: Not on file    Gets together: Not on file    Attends religious service: Not on file    Active member of club or organization: Not on file    Attends meetings of clubs or organizations: Not on file    Relationship status: Not on file  Other Topics Concern  . Not on file  Social History Narrative  . Not on file    Additional Social History:   Allergies:   Allergies  Allergen Reactions  . Albuterol Shortness Of Breath and Other (See Comments)    Makes pt feel jittery/ tacycardic  . Crestor [Rosuvastatin] Other (See Comments)    Joint pain, muscle pain, and hair loss  . Halcion [Triazolam] Other (See Comments)    Dizziness,headaches,bladder problems  . Levaquin  [Levofloxacin In D5w] Diarrhea and Itching    Shoulder pain  . Naproxen Sodium Swelling    Patient tolerates in small doses  . Tylenol [Acetaminophen] Swelling    Patient tolerates in small doses  . Cefaclor Other (See Comments)    Doesn't remember---unsure if actually allergic   . Diclofenac Sodium Other (See Comments)    "made very sick"  . Sulfa Antibiotics Itching    Unsure of reaction possibly itching  . Tramadol Itching and Nausea And Vomiting  . Aripiprazole Other (See Comments)    Muscle tension/cramping  . Ibuprofen Swelling    Patient tolerates  in small doses    Metabolic Disorder Labs: Lab Results  Component Value Date   HGBA1C 5.9 11/24/2017   No results found for: PROLACTIN Lab Results  Component Value Date   CHOL 305 (H) 11/24/2017   TRIG 208.0 (H) 11/24/2017   HDL 43.00 11/24/2017   CHOLHDL 7 11/24/2017   VLDL 41.6 (H) 11/24/2017   LDLCALC 223 (H) 11/13/2014     Current Medications: Current Outpatient Medications  Medication Sig Dispense Refill  . ASPERCREME LIDOCAINE EX Apply 1 application topically 4 (four) times daily as needed (for pain.).     Marland Kitchen aspirin 325 MG tablet Take 325 mg by mouth every 4 (four) hours as needed for headache.     . benzonatate (TESSALON) 100 MG capsule Take 1-2 capsules (100-200 mg total) 3 (three) times daily as needed by mouth for cough. 40 capsule 0  . Biotin 10000 MCG TABS Take 30,000 mcg by mouth daily.     . calcipotriene-betamethasone (TACLONEX) ointment Apply 1 application topically daily as needed (for psorasis).     . Cholecalciferol (VITAMIN D3) 10000 units capsule Take 10,000 Units by mouth daily.    . clobetasol cream (TEMOVATE) 9.62 % Apply 1 application topically 2 (two) times daily. 30 g 2  . cyanocobalamin (,VITAMIN B-12,) 1000 MCG/ML injection Inject 1 mL (1,000 mcg total) into the muscle every 30 (thirty) days. For 6 Months from 11/2017. 1 mL 0  . diclofenac sodium (VOLTAREN) 1 % GEL Apply 4 g topically 4  (four) times daily. (Patient taking differently: Apply 4 g topically 4 (four) times daily as needed (for pain.). ) 500 g 5  . docusate sodium (COLACE) 100 MG capsule Take 1 capsule (100 mg total) by mouth 2 (two) times daily. (Patient taking differently: Take 100 mg by mouth 2 (two) times daily as needed. ) 60 capsule 0  . DULoxetine (CYMBALTA) 20 MG capsule TAKE 2 CAPSULES (40 MG TOTAL) BY MOUTH AT BEDTIME. 60 capsule 5  . DULoxetine (CYMBALTA) 20 MG capsule Take 2 capsules (40 mg total) by mouth at bedtime. 60 capsule 5  . HYDROcodone-acetaminophen (NORCO) 10-325 MG tablet Take 1 tablet by mouth every 6 (six) hours as needed for moderate pain or severe pain. Supervising physician; Dr Eliezer Lofts; DEA# [id number] 120 tablet 0  . hydroquinone 4 % cream 1 APPLICATION TOPICALLY DAILY AS NEEDED FOR BLEMISHES.  3  . hydrOXYzine (VISTARIL) 50 MG capsule 1   q  Noon  1  q 6:00 pm  1  prn 90 capsule 5  . lamoTRIgine (LAMICTAL) 150 MG tablet Take 2 tablets (300 mg total) by mouth at bedtime. 60 tablet 5  . levalbuterol (XOPENEX HFA) 45 MCG/ACT inhaler Inhale 1-2 puffs into the lungs every 6 (six) hours as needed for wheezing. 1 Inhaler 5  . Lysine 1000 MG TABS Take 2,000-4,000 mg by mouth at bedtime. 2000 mg scheduled at bedtime and patient will take 4000 mg if she has outbreak    . Magnesium 500 MG TABS Take 1 tablet by mouth at bedtime.    Marland Kitchen neomycin-bacitracin-polymyxin (NEOSPORIN) [phone] ointment Apply 1 application topically 4 (four) times daily as needed (for cut/scrapes.).    Marland Kitchen nicotine (NICODERM CQ - DOSED IN MG/24 HOURS) 21 mg/24hr patch Place 1 patch (21 mg total) onto the skin daily. 28 patch 3  . phenazopyridine (AZO-TABS) 95 MG tablet Take 95 mg by mouth 3 (three) times daily as needed for pain.    . polyethylene glycol powder (GLYCOLAX/MIRALAX) powder MIX  17 GRAMS (1 CAPFUL) WITH 4-8 OZ OF LIQUID AND TAKE BY MOUTH TWICE DAILY AS NEEDED 527 g 0  . temazepam (RESTORIL) 30 MG capsule TAKE ONE TO  TWO CAPSULES BY MOUTH NIGHTLY AT BEDTIME 60 capsule 4  . tiZANidine (ZANAFLEX) 4 MG tablet TAKE 2 TABLETS (8 MG TOTAL) BY MOUTH EVERY 6 (SIX) HOURS AS NEEDED FOR MUSCLE SPASMS. 720 tablet 0  . traZODone (DESYREL) 50 MG tablet Take 1 tablet (50 mg total) by mouth at bedtime. 1  qhs  For 3 days if fails the 2  qhs 60 tablet 5  . zolpidem (AMBIEN) 10 MG tablet Take 1 tablet (10 mg total) by mouth at bedtime. 30 tablet 2  . zolpidem (AMBIEN) 10 MG tablet Take 1 tablet (10 mg total) by mouth at bedtime as needed for sleep. 30 tablet 3   No current facility-administered medications for this visit.     Neurologic: Headache: No Seizure: No Paresthesias:No  Musculoskeletal: Strength & Muscle Tone: within normal limits Gait & Station: normal Patient leans: N/A  Psychiatric Specialty Exam: ROS  Blood pressure 126/80, pulse (!) 101, height 5\' 9"  (1.753 m), weight 198 lb (89.8 kg), last menstrual period 08/23/2014.Body mass index is 29.24 kg/m.  General Appearance: Casual  Eye Contact:  Good  Speech:  Clear and Coherent  Volume:  Normal  Mood:  Negative  Affect:  Appropriate  Thought Process:  Goal Directed  Orientation:  NA  Thought Content:  Logical  Suicidal Thoughts:  No  Homicidal Thoughts:  No  Memory:  Negative  Judgement:  Good  Insight:  Good  Psychomotor Activity:  Normal  Concentration:    Recall:    Fund of Knowledge:Good  Language: Good  Akathisia:  No  Handed:  Right  AIMS (if indicated):    Assets:  Desire for Improvement  ADL's:  Intact  Cognition: WNL  Sleep:      3/27/20193:33 PM  At this time the patient continue taking all medications including Ambien, Cymbalta 40 mg 300 mg of Lamictal trial as prescribed. The patient is unable to afford psychotherapy this time. Overall she is functioning actually fairly well. She is engaging and intelligent. She has a lot of family issues mainly related to her son was trying to distance himself from her. Patient presently  is in no relationship. She'll return to see me in 4 months and at that time she will have.

## 2018-02-15 ENCOUNTER — Ambulatory Visit (INDEPENDENT_AMBULATORY_CARE_PROVIDER_SITE_OTHER): Payer: PPO | Admitting: Primary Care

## 2018-02-15 ENCOUNTER — Encounter: Payer: Self-pay | Admitting: Primary Care

## 2018-02-15 VITALS — BP 116/78 | HR 97 | Temp 98.0°F | Ht 69.0 in | Wt 203.5 lb

## 2018-02-15 DIAGNOSIS — E559 Vitamin D deficiency, unspecified: Secondary | ICD-10-CM

## 2018-02-15 DIAGNOSIS — Z0184 Encounter for antibody response examination: Secondary | ICD-10-CM

## 2018-02-15 DIAGNOSIS — G894 Chronic pain syndrome: Secondary | ICD-10-CM | POA: Diagnosis not present

## 2018-02-15 NOTE — Patient Instructions (Addendum)
Stop by the lab prior to leaving today. I will notify you of your results once received.   Please schedule a follow up appointment in 3 months.  It was a pleasure to see you today!

## 2018-02-15 NOTE — Progress Notes (Signed)
Subjective:    Patient ID: Emily Moon, female    DOB: 05/11/1961, 57 y.o.   MRN: [id number]  HPI  Emily Moon is a 57 year old female who presents today for chronic pain medication evaluation. She is also requesting immunity testing for measles. She is also due for Vitamin D testing.   She is sure she received the measles vaccination in the 1960's, dead vaccination. She is worried about the recent outbreak of measles in other states, especially given her history of MS.  She is currently managed on Hydrocodone-APAP 10-325 mg every 6 hours for which she takes scheduled for chronic hip pain from hip dysplasia, chronic neck pain, and chronic osteoarthritis. She does follow with sports medicine who has recommended physical therapy for which she cannot afford.  Indication for chronic opioid: Chronic hip pain, osteoarthritis, cervical spinal pain Medication and dose: hydrocodone-apap 10-325 mg # pills per month: 120 Last UDS date: August 2018, consistent with medication use. No other drugs noted. Due in August 2019 Opioid Treatment Agreement signed (Y/N): Y, due again in August 2019 Opioid Treatment Agreement last reviewed with patient:   January 2019 PMP Aware reviewed this encounter (include red flags):  No suspicious activity     Review of Systems  Respiratory: Negative for shortness of breath.   Cardiovascular: Negative for chest pain.  Neurological: Negative for dizziness and headaches.       Past Medical History:  Diagnosis Date  . Arthritis    osteo  . Asthma   . Bipolar disorder ([place]) 05/21/14  . Cataracts, bilateral   . DDD (degenerative disc disease), cervical    also back  . Depression   . Dizziness    Positional  . Edema    feet/legs  . Fibromyalgia syndrome   . Fungal infection    Finger nails  . GERD (gastroesophageal reflux disease)   . Gout   . Headache    seasonal allergies  . Heart palpitations   . 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  Hypercholesterolemia   . Multiple sclerosis (HCC)    weakness  . Nephrolithiasis    kidney stones  . Osteoporosis    osteoarthritis  . Pneumonia   . PONV (postoperative nausea and vomiting)    no problem after cataract surgery  . Psoriasis   . Renal stone   . Shortness of breath dyspnea    wheezing  . Sleep apnea 2012   sleep study / slight, no interventions  . Urinary frequency      Social History   Socioeconomic History  . Marital status: Divorced    Spouse name: Not on file  . Number of children: 1  . Years of education: Not on file  . Highest education level: Not on file  Occupational History  . 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 Financial resource strain: Not on file  . Food insecurity:    Worry: Not on file    Inability: Not on file  . Transportation needs:    Medical: Not on file    Non-medical: Not on file  Tobacco Use  . Smoking status: Former Smoker    Years: 25.00    Types: Cigarettes    Last attempt to quit: 11/16/2012    Years since quitting: 5.2  . Smokeless tobacco: Never Used  . Tobacco comment: occasional use  Substance and Sexual Activity  . Alcohol use: No    Alcohol/week:  0.0 oz  . Drug use: No    Types: Methylphenidate  . Sexual activity: Never  Lifestyle  . Physical activity:    Days per week: Not on file    Minutes per session: Not on file  . Stress: Not on file  Relationships  . Social connections:    Talks on phone: Not on file    Gets together: Not on file    Attends religious service: Not on file    Active member of club or organization: Not on file    Attends meetings of clubs or organizations: Not on file    Relationship status: Not on file  . Intimate partner violence:    Fear of current or ex partner: Not on file    Emotionally abused: Not on file    Physically abused: Not on file    Forced sexual activity: Not on file  Other Topics Concern  . Not on file  Social  History Narrative  . Not on file    Past Surgical History:  Procedure Laterality Date  . CATARACT EXTRACTION W/PHACO Left 05/21/2015   Procedure: CATARACT EXTRACTION PHACO AND INTRAOCULAR LENS PLACEMENT (IOC);  Surgeon: Birder Robson, MD;  Location: [facility];  Service: Ophthalmology;  Laterality: Left;  [place] 00:35 AP% 22.9 CDE 8.11 fluid pack lot #[id number]  . CATARACT EXTRACTION W/PHACO Right 06/04/2015   Procedure: CATARACT EXTRACTION PHACO AND INTRAOCULAR LENS PLACEMENT (IOC);  Surgeon: Birder Robson, MD;  Location: [facility];  Service: Ophthalmology;  Laterality: Right;  US:00:48 AP%: 10.5 CDE:5.08 Fluid lot #[id number]  . CYSTOSCOPY/URETEROSCOPY/HOLMIUM LASER/STENT PLACEMENT Bilateral 09/22/2016   Procedure: CYSTOSCOPY/URETEROSCOPY/HOLMIUM LASER/STENT PLACEMENT;  Surgeon: Hollice Espy, MD;  Location: [facility];  Service: Urology;  Laterality: Bilateral;  . EYE SURGERY  2015   tissue biopsy  . FOOT SURGERY  2015  . JOINT REPLACEMENT Left 2013   hip replacement  . LITHOTRIPSY    . PTOSIS REPAIR Bilateral 02/18/2016   Procedure: BILATERAL PTOSIS REPAIR UPPER EYELIDS;  Surgeon: Karle Starch, MD;  Location: [facility];  Service: Ophthalmology;  Laterality: Bilateral;  LEAVE PT EARLY AM  . thumb surgery Right   . TONSILLECTOMY  1973    Family History  Problem Relation Age of Onset  . Cancer Father        Abdomen with mastasis  . Cancer Mother   . Heart disease Mother   . Kidney disease Neg Hx   . Bladder Cancer Neg Hx   . Prostate cancer Neg Hx   . Kidney cancer Neg Hx     Allergies  Allergen Reactions  . Albuterol Shortness Of Breath and Other (See Comments)    Makes pt feel jittery/ tacycardic  . Crestor [Rosuvastatin] Other (See Comments)    Joint pain, muscle pain, and hair loss  . Halcion [Triazolam] Other (See Comments)    Dizziness,headaches,bladder problems  . Levaquin [Levofloxacin In D5w] Diarrhea and Itching    Shoulder pain  . Naproxen Sodium  Swelling    Patient tolerates in small doses  . Tylenol [Acetaminophen] Swelling    Patient tolerates in small doses  . Cefaclor Other (See Comments)    Doesn't remember---unsure if actually allergic   . Diclofenac Sodium Other (See Comments)    "made very sick"  . Sulfa Antibiotics Itching    Unsure of reaction possibly itching  . Tramadol Itching and Nausea And Vomiting  . Aripiprazole Other (See Comments)    Muscle tension/cramping  . Ibuprofen Swelling    Patient  tolerates in small doses    Current Outpatient Medications on File Prior to Visit  Medication Sig Dispense Refill  . ASPERCREME LIDOCAINE EX Apply 1 application topically 4 (four) times daily as needed (for pain.).     Marland Kitchen aspirin 325 MG tablet Take 325 mg by mouth every 4 (four) hours as needed for headache.     . Biotin 10000 MCG TABS Take 10,000 mcg by mouth daily.     . calcipotriene-betamethasone (TACLONEX) ointment Apply 1 application topically daily as needed (for psorasis).     . Cholecalciferol (VITAMIN D3) 10000 units capsule Take 30,000 Units by mouth daily.     . clobetasol cream (TEMOVATE) 4.27 % Apply 1 application topically 2 (two) times daily. 30 g 2  . cyanocobalamin (,VITAMIN B-12,) 1000 MCG/ML injection Inject 1 mL (1,000 mcg total) into the muscle every 30 (thirty) days. For 6 Months from 11/2017. 1 mL 0  . diclofenac sodium (VOLTAREN) 1 % GEL Apply 4 g topically 4 (four) times daily. (Patient taking differently: Apply 4 g topically 4 (four) times daily as needed (for pain.). ) 500 g 5  . docusate sodium (COLACE) 100 MG capsule Take 1 capsule (100 mg total) by mouth 2 (two) times daily. (Patient taking differently: Take 100 mg by mouth 2 (two) times daily as needed. ) 60 capsule 0  . DULoxetine (CYMBALTA) 20 MG capsule Take 2 capsules (40 mg total) by mouth at bedtime. 60 capsule 5  . HYDROcodone-acetaminophen (NORCO) 10-325 MG tablet Take 1 tablet by mouth every 6 (six) hours as needed for moderate pain or  severe pain. Supervising physician; Dr Eliezer Lofts; DEA# [id number] 120 tablet 0  . hydroquinone 4 % cream 1 APPLICATION TOPICALLY DAILY AS NEEDED FOR BLEMISHES.  3  . hydrOXYzine (VISTARIL) 50 MG capsule 1   q  Noon  1  q 6:00 pm  1  prn 90 capsule 5  . lamoTRIgine (LAMICTAL) 150 MG tablet Take 2 tablets (300 mg total) by mouth at bedtime. 60 tablet 5  . levalbuterol (XOPENEX HFA) 45 MCG/ACT inhaler Inhale 1-2 puffs into the lungs every 6 (six) hours as needed for wheezing. 1 Inhaler 5  . Lysine 1000 MG TABS Take 2,000-4,000 mg by mouth at bedtime. 2000 mg scheduled at bedtime and patient will take 4000 mg if she has outbreak    . Magnesium 500 MG TABS Take 1 tablet by mouth at bedtime.    Marland Kitchen neomycin-bacitracin-polymyxin (NEOSPORIN) [phone] ointment Apply 1 application topically 4 (four) times daily as needed (for cut/scrapes.).    Marland Kitchen nicotine (NICODERM CQ - DOSED IN MG/24 HOURS) 21 mg/24hr patch Place 1 patch (21 mg total) onto the skin daily. 28 patch 3  . phenazopyridine (AZO-TABS) 95 MG tablet Take 95 mg by mouth 3 (three) times daily as needed for pain.    . polyethylene glycol powder (GLYCOLAX/MIRALAX) powder MIX 17 GRAMS (1 CAPFUL) WITH 4-8 OZ OF LIQUID AND TAKE BY MOUTH TWICE DAILY AS NEEDED 527 g 0  . temazepam (RESTORIL) 30 MG capsule TAKE ONE TO TWO CAPSULES BY MOUTH NIGHTLY AT BEDTIME 60 capsule 4  . tiZANidine (ZANAFLEX) 4 MG tablet TAKE 2 TABLETS (8 MG TOTAL) BY MOUTH EVERY 6 (SIX) HOURS AS NEEDED FOR MUSCLE SPASMS. 720 tablet 0  . traZODone (DESYREL) 50 MG tablet Take 1 tablet (50 mg total) by mouth at bedtime. 1  qhs  For 3 days if fails the 2  qhs (Patient taking differently: Take 100 mg by mouth at  bedtime. 1  qhs  For 3 days if fails the 2  qhs) 60 tablet 5  . zolpidem (AMBIEN) 10 MG tablet Take 1 tablet (10 mg total) by mouth at bedtime. 30 tablet 2   No current facility-administered medications on file prior to visit.     BP 116/78   Pulse 97   Temp 98 F (36.7 C) (Oral)    Ht 5\' 9"  (1.753 m)   Wt 203 lb 8 oz (92.3 kg)   LMP 08/23/2014   SpO2 97%   BMI 30.05 kg/m    Objective:   Physical Exam  Constitutional: She appears well-nourished.  Neck: Neck supple.  Cardiovascular: Normal rate and regular rhythm.  Pulmonary/Chest: Effort normal and breath sounds normal.  Skin: Skin is warm and dry.          Assessment & Plan:

## 2018-02-15 NOTE — Assessment & Plan Note (Signed)
Repeat Vitamin D level pending.   

## 2018-02-15 NOTE — Assessment & Plan Note (Signed)
Doing well on Norco 10-325 mg for which she has taken for years. No suspicious activity noted on PMP Aware. Urine drug screen and controlled substance contract are up to date.   Do agree with continuing opioids given history. Do recommend physical therapy for which we discussed today. She'll notify when she's able to financially move forward.  Will send to supervising physician for review.

## 2018-02-17 ENCOUNTER — Encounter: Payer: Self-pay | Admitting: Primary Care

## 2018-03-07 ENCOUNTER — Encounter: Payer: Self-pay | Admitting: Primary Care

## 2018-03-07 DIAGNOSIS — M1990 Unspecified osteoarthritis, unspecified site: Secondary | ICD-10-CM

## 2018-03-07 MED ORDER — HYDROCODONE-ACETAMINOPHEN 10-325 MG PO TABS: 1.0000 | ORAL_TABLET | Freq: Four times a day (QID) | ORAL | 0 refills | Status: DC | PRN

## 2018-03-14 ENCOUNTER — Encounter: Payer: Self-pay | Admitting: Primary Care

## 2018-03-28 ENCOUNTER — Encounter: Payer: Self-pay | Admitting: Primary Care

## 2018-03-28 DIAGNOSIS — M1990 Unspecified osteoarthritis, unspecified site: Secondary | ICD-10-CM

## 2018-04-01 ENCOUNTER — Other Ambulatory Visit: Payer: Self-pay | Admitting: Primary Care

## 2018-04-01 DIAGNOSIS — M1990 Unspecified osteoarthritis, unspecified site: Secondary | ICD-10-CM

## 2018-04-01 NOTE — Telephone Encounter (Signed)
Received faxed refill request for Hydrocodone-acetaminophen (NORCO) 10-325 MG tablet  Last prescribed on 03/07/2018. Last seen 02/15/2018  Pharmacy comments: Patient is requsting new hydrocodone Rx. Patient is leaving out of town for birthday. Will pick up on the 24th. Please indicate that early fill is okay on the Rx.

## 2018-04-04 MED ORDER — HYDROCODONE-ACETAMINOPHEN 10-325 MG PO TABS: 1.0000 | ORAL_TABLET | Freq: Four times a day (QID) | ORAL | 0 refills | Status: DC | PRN

## 2018-04-06 ENCOUNTER — Telehealth: Payer: Self-pay | Admitting: Primary Care

## 2018-04-06 NOTE — Telephone Encounter (Signed)
Copied from [place] [id number]. Topic: Quick Communication - See Telephone Encounter >> Apr 06, 2018 10:05 AM Ivar Drape wrote: CRM for notification. See Telephone encounter for: 04/06/18. Anne Ng w/[employer] [phone] need an approval for the patient's HYDROcodone-acetaminophen ([place]) 10-325 MG tablet to be refilled early because the patient is going out of town.

## 2018-04-06 NOTE — Telephone Encounter (Signed)
Spoken to rep from [place] gave them the okay to fill the medication. She inform me that she [facility].

## 2018-04-06 NOTE — Telephone Encounter (Signed)
It's okay with me if the pharmacy fills early. Rx was sent on 04/04/18.

## 2018-04-06 NOTE — Telephone Encounter (Signed)
Spoken to the pharmacy regarding the okay. Will give the okay to the insurance as well so they will call the pharmacy to fill the medication

## 2018-04-06 NOTE — Telephone Encounter (Signed)
Refill for hydrocodone was sent on 04/04/18. Will you have patient check with her pharmacy? Thanks.

## 2018-04-06 NOTE — Telephone Encounter (Signed)
Copied from [place] [id number]. Topic: Quick Communication - See Telephone Encounter >> Apr 06, 2018 10:10 AM Cleaster Corin, NT wrote: CRM for notification. See Telephone encounter for: 04/06/18.  [facility] to let NP Allie Bossier know that pt. Is leaving town a day early and is needing rx. Refill before actually date that med. Can be refilled. Seeing if its ok for the refill to be done a day early. Insurance eompany will also be callingto check to make sure it will be approved by provider. HYDROcodone-acetaminophen ([place]) 10-325 MG tablet [[id number]]  [id number] [place] [address] [place] [address] [place] [address] Phone: [phone] Fax: [phone]

## 2018-04-07 ENCOUNTER — Encounter: Payer: Self-pay | Admitting: Primary Care

## 2018-05-01 ENCOUNTER — Encounter: Payer: Self-pay | Admitting: Primary Care

## 2018-05-03 ENCOUNTER — Encounter: Payer: Self-pay | Admitting: Primary Care

## 2018-05-06 ENCOUNTER — Encounter: Payer: Self-pay | Admitting: Primary Care

## 2018-05-06 ENCOUNTER — Ambulatory Visit (INDEPENDENT_AMBULATORY_CARE_PROVIDER_SITE_OTHER): Payer: PPO | Admitting: Primary Care

## 2018-05-06 VITALS — BP 116/74 | HR 102 | Temp 98.2°F | Ht 69.0 in | Wt 191.2 lb

## 2018-05-06 DIAGNOSIS — M1990 Unspecified osteoarthritis, unspecified site: Secondary | ICD-10-CM

## 2018-05-06 DIAGNOSIS — G8929 Other chronic pain: Secondary | ICD-10-CM

## 2018-05-06 DIAGNOSIS — M546 Pain in thoracic spine: Secondary | ICD-10-CM | POA: Diagnosis not present

## 2018-05-06 DIAGNOSIS — G894 Chronic pain syndrome: Secondary | ICD-10-CM

## 2018-05-06 DIAGNOSIS — Z79899 Other long term (current) drug therapy: Secondary | ICD-10-CM

## 2018-05-06 MED ORDER — HYDROCODONE-ACETAMINOPHEN 10-325 MG PO TABS: 1.0000 | ORAL_TABLET | Freq: Four times a day (QID) | ORAL | 0 refills | Status: DC | PRN

## 2018-05-06 NOTE — Assessment & Plan Note (Signed)
Doing well on Norco 10-325 mg tablets every 6 hours, continue same.

## 2018-05-06 NOTE — Assessment & Plan Note (Signed)
Doing well on Norco 10-325 mg every 6 hours, continue same.

## 2018-05-06 NOTE — Progress Notes (Signed)
Subjective:    Patient ID: Emily Moon, female    DOB: [birth year], 57 y.o.   MRN: [id number]  HPI  Emily Moon is a 57 year old female with a history of osteoarthritis, multiple sclerosis, hip dysplasia who presents today for chronic pain management follow up.  She is currently managed on hydrocodone 10-325 mg tablets for which she takes every 6 hours daily for chronic pain. Her pain is located to the bilateral hips, lower back, and neck. She feels well managed on her current regimen and is needing a refill today. Her urine drug screen is due in August 2019.   She denies chest pain, drowsiness, dizziness.   Review of Systems  Respiratory: Negative for shortness of breath.   Cardiovascular: Negative for chest pain.  Musculoskeletal: Positive for arthralgias, back pain and neck pain.  Neurological: Negative for dizziness.       Past Medical History:  Diagnosis Date  . Arthritis    osteo  . Asthma   . Bipolar disorder ([place]) 05/21/14  . Cataracts, bilateral   . DDD (degenerative disc disease), cervical    also back  . Depression   . Dizziness    Positional  . Edema    feet/legs  . Fibromyalgia syndrome   . Fungal infection    Finger nails  . GERD (gastroesophageal reflux disease)   . Gout   . Headache    seasonal allergies  . Heart palpitations   . 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 Hypercholesterolemia   . Multiple sclerosis ([facility])    weakness  . Nephrolithiasis    kidney stones  . Osteoporosis    osteoarthritis  . Pneumonia   . PONV (postoperative nausea and vomiting)    no problem after cataract surgery  . Psoriasis   . Renal stone   . Shortness of breath dyspnea    wheezing  . Sleep apnea 2012   sleep study / slight, no interventions  . Urinary frequency      Social History   Socioeconomic History  . Marital status: Divorced    Spouse name: Not on file  . Number of children: 1  . Years of education: Not on file  . Highest education  level: Not on file  Occupational History  . Occupation: Therapist, art Rep at [employer]  . Financial resource strain: Not on file  . Food insecurity:    Worry: Not on file    Inability: Not on file  . Transportation needs:    Medical: Not on file    Non-medical: Not on file  Tobacco Use  . Smoking status: Former Smoker    Years: 25.00    Types: Cigarettes    Last attempt to quit: 11/16/2012    Years since quitting: 5.4  . Smokeless tobacco: Never Used  . Tobacco comment: occasional use  Substance and Sexual Activity  . Alcohol use: No    Alcohol/week: 0.0 oz  . Drug use: No    Types: Methylphenidate  . Sexual activity: Never  Lifestyle  . Physical activity:    Days per week: Not on file    Minutes per session: Not on file  . Stress: Not on file  Relationships  . Social connections:    Talks on phone: Not on file    Gets together: Not on file    Attends religious service: Not on file    Active member of club or organization: Not  on file    Attends meetings of clubs or organizations: Not on file    Relationship status: Not on file  . Intimate partner violence:    Fear of current or ex partner: Not on file    Emotionally abused: Not on file    Physically abused: Not on file    Forced sexual activity: Not on file  Other Topics Concern  . Not on file  Social History Narrative  . Not on file    Past Surgical History:  Procedure Laterality Date  . CATARACT EXTRACTION W/PHACO Left 05/21/2015   Procedure: CATARACT EXTRACTION PHACO AND INTRAOCULAR LENS PLACEMENT (IOC);  Surgeon: Birder Robson, MD;  Location: [facility];  Service: Ophthalmology;  Laterality: Left;  [place] 00:35 AP% 22.9 CDE 8.11 fluid pack lot #[id number]  . CATARACT EXTRACTION W/PHACO Right 06/04/2015   Procedure: CATARACT EXTRACTION PHACO AND INTRAOCULAR LENS PLACEMENT (IOC);  Surgeon: Birder Robson, MD;  Location: [facility];  Service: Ophthalmology;  Laterality: Right;   US:00:48 AP%: 10.5 CDE:5.08 Fluid lot #[id number]  . CYSTOSCOPY/URETEROSCOPY/HOLMIUM LASER/STENT PLACEMENT Bilateral 09/22/2016   Procedure: CYSTOSCOPY/URETEROSCOPY/HOLMIUM LASER/STENT PLACEMENT;  Surgeon: Hollice Espy, MD;  Location: [facility];  Service: Urology;  Laterality: Bilateral;  . EYE SURGERY  2015   tissue biopsy  . FOOT SURGERY  2015  . JOINT REPLACEMENT Left 2013   hip replacement  . LITHOTRIPSY    . PTOSIS REPAIR Bilateral 02/18/2016   Procedure: BILATERAL PTOSIS REPAIR UPPER EYELIDS;  Surgeon: Karle Starch, MD;  Location: [facility];  Service: Ophthalmology;  Laterality: Bilateral;  LEAVE PT EARLY AM  . thumb surgery Right   . TONSILLECTOMY  1973    Family History  Problem Relation Age of Onset  . Cancer Father        Abdomen with mastasis  . Cancer Mother   . Heart disease Mother   . Kidney disease Neg Hx   . Bladder Cancer Neg Hx   . Prostate cancer Neg Hx   . Kidney cancer Neg Hx     Allergies  Allergen Reactions  . Albuterol Shortness Of Breath and Other (See Comments)    Makes pt feel jittery/ tacycardic  . Crestor [Rosuvastatin] Other (See Comments)    Joint pain, muscle pain, and hair loss  . Halcion [Triazolam] Other (See Comments)    Dizziness,headaches,bladder problems  . Levaquin [Levofloxacin In D5w] Diarrhea and Itching    Shoulder pain  . Naproxen Sodium Swelling    Patient tolerates in small doses  . Tylenol [Acetaminophen] Swelling    Patient tolerates in small doses  . Cefaclor Other (See Comments)    Doesn't remember---unsure if actually allergic   . Diclofenac Sodium Other (See Comments)    "made very sick"  . Sulfa Antibiotics Itching    Unsure of reaction possibly itching  . Tramadol Itching and Nausea And Vomiting  . Aripiprazole Other (See Comments)    Muscle tension/cramping  . Ibuprofen Swelling    Patient tolerates in small doses    Current Outpatient Medications on File Prior to Visit  Medication Sig Dispense  Refill  . ASPERCREME LIDOCAINE EX Apply 1 application topically 4 (four) times daily as needed (for pain.).     Marland Kitchen aspirin 325 MG tablet Take 325 mg by mouth every 4 (four) hours as needed for headache.     . Biotin 10000 MCG TABS Take 10,000 mcg by mouth daily.     . calcipotriene-betamethasone (TACLONEX) ointment Apply 1 application topically daily as needed (  for psorasis).     . Cholecalciferol (VITAMIN D3) 10000 units capsule Take 30,000 Units by mouth daily.     . clobetasol cream (TEMOVATE) 5.39 % Apply 1 application topically 2 (two) times daily. 30 g 2  . cyanocobalamin (,VITAMIN B-12,) 1000 MCG/ML injection Inject 1 mL (1,000 mcg total) into the muscle every 30 (thirty) days. For 6 Months from 11/2017. 1 mL 0  . diclofenac sodium (VOLTAREN) 1 % GEL Apply 4 g topically 4 (four) times daily. (Patient taking differently: Apply 4 g topically 4 (four) times daily as needed (for pain.). ) 500 g 5  . docusate sodium (COLACE) 100 MG capsule Take 1 capsule (100 mg total) by mouth 2 (two) times daily. (Patient taking differently: Take 100 mg by mouth 2 (two) times daily as needed. ) 60 capsule 0  . DULoxetine (CYMBALTA) 20 MG capsule Take 2 capsules (40 mg total) by mouth at bedtime. 60 capsule 5  . hydroquinone 4 % cream 1 APPLICATION TOPICALLY DAILY AS NEEDED FOR BLEMISHES.  3  . hydrOXYzine (VISTARIL) 50 MG capsule 1   q  Noon  1  q 6:00 pm  1  prn 90 capsule 5  . lamoTRIgine (LAMICTAL) 150 MG tablet Take 2 tablets (300 mg total) by mouth at bedtime. 60 tablet 5  . levalbuterol (XOPENEX HFA) 45 MCG/ACT inhaler Inhale 1-2 puffs into the lungs every 6 (six) hours as needed for wheezing. 1 Inhaler 5  . Lysine 1000 MG TABS Take 2,000-4,000 mg by mouth at bedtime. 2000 mg scheduled at bedtime and patient will take 4000 mg if she has outbreak    . Magnesium 500 MG TABS Take 1 tablet by mouth at bedtime.    Marland Kitchen neomycin-bacitracin-polymyxin (NEOSPORIN) [phone] ointment Apply 1 application topically 4  (four) times daily as needed (for cut/scrapes.).    Marland Kitchen phenazopyridine (AZO-TABS) 95 MG tablet Take 95 mg by mouth 3 (three) times daily as needed for pain.    . polyethylene glycol powder (GLYCOLAX/MIRALAX) powder MIX 17 GRAMS (1 CAPFUL) WITH 4-8 OZ OF LIQUID AND TAKE BY MOUTH TWICE DAILY AS NEEDED 527 g 0  . temazepam (RESTORIL) 30 MG capsule TAKE ONE TO TWO CAPSULES BY MOUTH NIGHTLY AT BEDTIME 60 capsule 4  . tiZANidine (ZANAFLEX) 4 MG tablet TAKE 2 TABLETS (8 MG TOTAL) BY MOUTH EVERY 6 (SIX) HOURS AS NEEDED FOR MUSCLE SPASMS. 720 tablet 0  . traZODone (DESYREL) 50 MG tablet Take 1 tablet (50 mg total) by mouth at bedtime. 1  qhs  For 3 days if fails the 2  qhs (Patient taking differently: Take 100 mg by mouth at bedtime. 1  qhs  For 3 days if fails the 2  qhs) 60 tablet 5  . zolpidem (AMBIEN) 10 MG tablet Take 1 tablet (10 mg total) by mouth at bedtime. 30 tablet 2  . nicotine (NICODERM CQ - DOSED IN MG/24 HOURS) 21 mg/24hr patch Place 1 patch (21 mg total) onto the skin daily. (Patient not taking: Reported on 05/06/2018) 28 patch 3   No current facility-administered medications on file prior to visit.     BP 116/74   Pulse (!) 102   Temp 98.2 F (36.8 C) (Oral)   Ht 5\' 9"  (1.753 m)   Wt 191 lb 4 oz (86.8 kg)   LMP 08/23/2014   SpO2 97%   BMI 28.24 kg/m    Objective:   Physical Exam  Constitutional: She appears well-nourished.  Neck: Neck supple.  Cardiovascular: Normal rate  and regular rhythm.  Respiratory: Effort normal and breath sounds normal.  Skin: Skin is warm and dry.  Psychiatric: She has a normal mood and affect.           Assessment & Plan:

## 2018-05-06 NOTE — Assessment & Plan Note (Addendum)
Here a few days early for 3 month follow up as she will be out of town during the due date.  Stable on Norco 10-325 mg tablets every 6 hours, feels well managed. Agree that she benefits from this regimen and recommend to continue.  UDS is due in August 2019, she will have this done in mid July 2019 as she will be out of town for the due date. PMP aware site reviewed and no suspicious activity noted.  Refill sent to pharmacy today. Will send note to supervising physician for review. Follow up in 3 months.

## 2018-05-06 NOTE — Patient Instructions (Signed)
Schedule a lab only appointment for July to have your urine drug screen done.  Please remind me of your next refill in July as discussed.  Please schedule a follow up appointment in 3 months.  It was a pleasure to see you today!

## 2018-05-07 ENCOUNTER — Other Ambulatory Visit (HOSPITAL_COMMUNITY): Payer: Self-pay | Admitting: Psychiatry

## 2018-05-14 ENCOUNTER — Other Ambulatory Visit (HOSPITAL_COMMUNITY): Payer: Self-pay | Admitting: Psychiatry

## 2018-05-16 ENCOUNTER — Other Ambulatory Visit (HOSPITAL_COMMUNITY): Payer: Self-pay

## 2018-05-16 MED ORDER — ZOLPIDEM TARTRATE 10 MG PO TABS: 10.0000 mg | ORAL_TABLET | Freq: Every day | ORAL | 0 refills | Status: DC

## 2018-05-20 ENCOUNTER — Other Ambulatory Visit: Payer: Self-pay | Admitting: Primary Care

## 2018-05-20 ENCOUNTER — Ambulatory Visit (HOSPITAL_COMMUNITY): Payer: PPO | Admitting: Psychiatry

## 2018-05-20 ENCOUNTER — Encounter (HOSPITAL_COMMUNITY): Payer: Self-pay | Admitting: Psychiatry

## 2018-05-20 VITALS — BP 108/72 | HR 82 | Ht 69.0 in | Wt 197.4 lb

## 2018-05-20 DIAGNOSIS — M1990 Unspecified osteoarthritis, unspecified site: Secondary | ICD-10-CM

## 2018-05-20 DIAGNOSIS — F3110 Bipolar disorder, current episode manic without psychotic features, unspecified: Secondary | ICD-10-CM | POA: Diagnosis not present

## 2018-05-20 MED ORDER — DULOXETINE HCL 20 MG PO CPEP: 40.0000 mg | ORAL_CAPSULE | Freq: Every day | ORAL | 6 refills | Status: DC

## 2018-05-20 MED ORDER — ZOLPIDEM TARTRATE 10 MG PO TABS: 10.0000 mg | ORAL_TABLET | Freq: Every day | ORAL | 5 refills | Status: DC

## 2018-05-20 MED ORDER — TRAZODONE HCL 50 MG PO TABS: 100.0000 mg | ORAL_TABLET | Freq: Every day | ORAL | 7 refills | Status: DC

## 2018-05-20 MED ORDER — TEMAZEPAM 30 MG PO CAPS: ORAL_CAPSULE | ORAL | 4 refills | Status: DC

## 2018-05-20 MED ORDER — LAMOTRIGINE 150 MG PO TABS: 300.0000 mg | ORAL_TABLET | Freq: Every day | ORAL | 5 refills | Status: DC

## 2018-05-20 NOTE — Telephone Encounter (Signed)
Copied from [place] [id number]. Topic: Quick Communication - Rx Refill/Question >> May 20, 2018 12:22 PM Selinda Flavin B, NT wrote: Medication: HYDROcodone-acetaminophen ([place]) 10-325 MG tablet  Has the patient contacted their pharmacy? Yes.   (Agent: If no, request that the patient contact the pharmacy for the refill.) (Agent: If yes, when and what did the pharmacy advise?)  Preferred Pharmacy (with phone number or street name[facility] [place] [place]: Please be advised that RX refills may take up to 3 business days. We ask that you follow-up with your pharmacy.   [facility] and states that the patient is going out of town on 06/01/18 and is needing a refill before she leaves so she does not run out while on vacation. Pharmacy states that it is not available for refill until 06/05/18. States they would need a new prescription and the OK from the provider to fill on 06/01/18. Please advise.

## 2018-05-20 NOTE — Telephone Encounter (Signed)
Name of Medication: Hydrocodone apap 10-325 Name of [facility]acy[facility] or Written Date and Quantity: # 120 on 05/06/18 Last Office Visit and Type: 05/06/18 Next Office Visit and Type: 07/22/18 3 mth f/u Last Controlled Substance Agreement Date: do not see contract under media tab Last UDS:05/06/18 with lab appt to [facility] on 05/27/18.  Since pt does not need until 06/01/18 per CRM will send to Gentry Fitz NP.

## 2018-05-20 NOTE — Progress Notes (Signed)
Psychiatric Initial Adult Assessment   Patient Identification: Emily Moon MRN:  [id number] Date of Evaluation:  05/20/2018 Referral Source: From the community Chief Complaint:   Visit Diagno  Today the patient is actually doing fairly well. She's moved into a new place although is not really great Place. She doesn't like. The good news though is the patient has finally found a way to be in therapy. She does something called [employer] which does free psychotherapy. She's had one session but didn't like the therapist is changed.her mood seems to be stable. She is sleeping and eating fairly well. She is quite as intrusive recall. She seems more contained. The patient is still grieving over the death of her sister January 03, 2023 goes to hospice for this reading process. The patient had few persistent neurological symptoms. She has had some problems with gait. The patient only takes biotin 100 mg once a day for her MS. Today the patient will continue taking all the medications prescribed.  Patient: Emily Moon  Procedure(s) Performed: * No surgery found *  Anesthes  Patient location:   Post pain:   Post assessment:   Last Vitals:  Vitals:   05/20/18 0913  BP: 108/72  Pulse: 82  SpO2: 97%    Post vital signs:   Level of consciousness:   Complications: . Associated Signs/Symptoms: Depression Symptoms:  hopelessness, (Hypo) Manic Symptoms:   Anxiety Symptoms:   Psychotic Symptoms:   PTSD Symptoms:   Past Psychiatric History: The patient was seen in a psychiatric office and takes multiple medications she's not been recently hospitalized.  Previous Psychotropic Medications: Yes   Substance Abuse History in the last 12 months:  No.  Consequences of Substance Abuse: Negative  Past Medical History:  Past Medical History:  Diagnosis Date  . Arthritis    osteo  . Asthma   . Bipolar disorder ([place]) 05/21/14  . Cataracts, bilateral   . DDD (degenerative disc  disease), cervical    also back  . Depression   . Dizziness    Positional  . Edema    feet/legs  . Fibromyalgia syndrome   . Fungal infection    Finger nails  . GERD (gastroesophageal reflux disease)   . Gout   . Headache    seasonal allergies  . Heart palpitations   . 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 Hypercholesterolemia   . Multiple sclerosis (HCC)    weakness  . Nephrolithiasis    kidney stones  . Osteoporosis    osteoarthritis  . Pneumonia   . PONV (postoperative nausea and vomiting)    no problem after cataract surgery  . Psoriasis   . Renal stone   . Shortness of breath dyspnea    wheezing  . Sleep apnea 2012   sleep study / slight, no interventions  . Urinary frequency     Past Surgical History:  Procedure Laterality Date  . CATARACT EXTRACTION W/PHACO Left 05/21/2015   Procedure: CATARACT EXTRACTION PHACO AND INTRAOCULAR LENS PLACEMENT (IOC);  Surgeon: Birder Robson, MD;  Location: [facility];  Service: Ophthalmology;  Laterality: Left;  [place] 00:35 AP% 22.9 CDE 8.11 fluid pack lot #[id number] H  . CATARACT EXTRACTION W/PHACO Right 06/04/2015   Procedure: CATARACT EXTRACTION PHACO AND INTRAOCULAR LENS PLACEMENT (IOC);  Surgeon: Birder Robson, MD;  Location: [facility];  Service: Ophthalmology;  Laterality: Right;  US:00:48 AP%: 10.5 CDE:5.08 Fluid lot #[id number] H  . CYSTOSCOPY/URETEROSCOPY/HOLMIUM LASER/STENT PLACEMENT Bilateral 09/22/2016   Procedure: CYSTOSCOPY/URETEROSCOPY/HOLMIUM LASER/STENT PLACEMENT;  Surgeon: Hollice Espy, MD;  Location: [facility];  Service: Urology;  Laterality: Bilateral;  . EYE SURGERY  2015   tissue biopsy  . FOOT SURGERY  2015  . JOINT REPLACEMENT Left 2013   hip replacement  . LITHOTRIPSY    . PTOSIS REPAIR Bilateral 02/18/2016   Procedure: BILATERAL PTOSIS REPAIR UPPER EYELIDS;  Surgeon: Karle Starch, MD;  Location: [facility];  Service: Ophthalmology;  Laterality: Bilateral;  LEAVE PT EARLY AM  . thumb surgery Right    . TONSILLECTOMY  1973    Family Psychiatric History:   Family History:  Family History  Problem Relation Age of Onset  . Cancer Father        Abdomen with mastasis  . Cancer Mother   . Heart disease Mother   . Kidney disease Neg Hx   . Bladder Cancer Neg Hx   . Prostate cancer Neg Hx   . Kidney cancer Neg Hx     Social History:   Social History   Socioeconomic History  . Marital status: Divorced    Spouse name: Not on file  . Number of children: 1  . Years of education: Not on file  . Highest education level: Not on file  Occupational History  . Occupation: Therapist, art Rep at [employer]  . Financial resource strain: Not on file  . Food insecurity:    Worry: Not on file    Inability: Not on file  . Transportation needs:    Medical: Not on file    Non-medical: Not on file  Tobacco Use  . Smoking status: Former Smoker    Years: 25.00    Types: Cigarettes    Last attempt to quit: 11/16/2012    Years since quitting: 5.5  . Smokeless tobacco: Never Used  . Tobacco comment: occasional use  Substance and Sexual Activity  . Alcohol use: No    Alcohol/week: 0.0 oz  . Drug use: No    Types: Methylphenidate  . Sexual activity: Never  Lifestyle  . Physical activity:    Days per week: Not on file    Minutes per session: Not on file  . Stress: Not on file  Relationships  . Social connections:    Talks on phone: Not on file    Gets together: Not on file    Attends religious service: Not on file    Active member of club or organization: Not on file    Attends meetings of clubs or organizations: Not on file    Relationship status: Not on file  Other Topics Concern  . Not on file  Social History Narrative  . Not on file    Additional Social History:   Allergies:   Allergies  Allergen Reactions  . Albuterol Shortness Of Breath and Other (See Comments)    Makes pt feel jittery/ tacycardic  . Crestor [Rosuvastatin] Other  (See Comments)    Joint pain, muscle pain, and hair loss  . Halcion [Triazolam] Other (See Comments)    Dizziness,headaches,bladder problems  . Levaquin [Levofloxacin In D5w] Diarrhea and Itching    Shoulder pain  . Naproxen Sodium Swelling    Patient tolerates in small doses  . Tylenol [Acetaminophen] Swelling    Patient tolerates in small doses  . Cefaclor Other (See Comments)    Doesn't remember---unsure if actually allergic   . Diclofenac Sodium Other (See Comments)    "made very sick"  . Sulfa Antibiotics Itching  Unsure of reaction possibly itching  . Tramadol Itching and Nausea And Vomiting  . Aripiprazole Other (See Comments)    Muscle tension/cramping  . Ibuprofen Swelling    Patient tolerates in small doses    Metabolic Disorder Labs: Lab Results  Component Value Date   HGBA1C 5.9 11/24/2017   No results found for: PROLACTIN Lab Results  Component Value Date   CHOL 305 (H) 11/24/2017   TRIG 208.0 (H) 11/24/2017   HDL 43.00 11/24/2017   CHOLHDL 7 11/24/2017   VLDL 41.6 (H) 11/24/2017   LDLCALC 223 (H) 11/13/2014     Current Medications: Current Outpatient Medications  Medication Sig Dispense Refill  . ASPERCREME LIDOCAINE EX Apply 1 application topically 4 (four) times daily as needed (for pain.).     Marland Kitchen Biotin 10000 MCG TABS Take 10,000 mcg by mouth daily.     . calcipotriene-betamethasone (TACLONEX) ointment Apply 1 application topically daily as needed (for psorasis).     . Cholecalciferol (VITAMIN D3) 10000 units capsule Take 30,000 Units by mouth daily.     . clobetasol cream (TEMOVATE) 1.69 % Apply 1 application topically 2 (two) times daily. 30 g 2  . cyanocobalamin (,VITAMIN B-12,) 1000 MCG/ML injection Inject 1 mL (1,000 mcg total) into the muscle every 30 (thirty) days. For 6 Months from 11/2017. 1 mL 0  . diclofenac sodium (VOLTAREN) 1 % GEL Apply 4 g topically 4 (four) times daily. (Patient taking differently: Apply 4 g topically 4 (four) times  daily as needed (for pain.). ) 500 g 5  . docusate sodium (COLACE) 100 MG capsule Take 1 capsule (100 mg total) by mouth 2 (two) times daily. (Patient taking differently: Take 100 mg by mouth 2 (two) times daily as needed. ) 60 capsule 0  . DULoxetine (CYMBALTA) 20 MG capsule Take 2 capsules (40 mg total) by mouth at bedtime. 60 capsule 6  . HYDROcodone-acetaminophen (NORCO) 10-325 MG tablet Take 1 tablet by mouth every 6 (six) hours as needed for moderate pain or severe pain. Supervising physician; Dr Eliezer Lofts; DEA# [id number] 120 tablet 0  . hydroquinone 4 % cream 1 APPLICATION TOPICALLY DAILY AS NEEDED FOR BLEMISHES.  3  . hydrOXYzine (VISTARIL) 50 MG capsule 1   q  Noon  1  q 6:00 pm  1  prn 90 capsule 5  . lamoTRIgine (LAMICTAL) 150 MG tablet Take 2 tablets (300 mg total) by mouth at bedtime. 60 tablet 5  . levalbuterol (XOPENEX HFA) 45 MCG/ACT inhaler Inhale 1-2 puffs into the lungs every 6 (six) hours as needed for wheezing. 1 Inhaler 5  . Lysine 1000 MG TABS Take 2,000-4,000 mg by mouth at bedtime. 2000 mg scheduled at bedtime and patient will take 4000 mg if she has outbreak    . Magnesium 500 MG TABS Take 1 tablet by mouth at bedtime.    Marland Kitchen neomycin-bacitracin-polymyxin (NEOSPORIN) [phone] ointment Apply 1 application topically 4 (four) times daily as needed (for cut/scrapes.).    Marland Kitchen nicotine (NICODERM CQ - DOSED IN MG/24 HOURS) 21 mg/24hr patch Place 1 patch (21 mg total) onto the skin daily. 28 patch 3  . phenazopyridine (AZO-TABS) 95 MG tablet Take 95 mg by mouth 3 (three) times daily as needed for pain.    . polyethylene glycol powder (GLYCOLAX/MIRALAX) powder MIX 17 GRAMS (1 CAPFUL) WITH 4-8 OZ OF LIQUID AND TAKE BY MOUTH TWICE DAILY AS NEEDED 527 g 0  . temazepam (RESTORIL) 30 MG capsule TAKE ONE TO TWO CAPSULES BY MOUTH  NIGHTLY AT BEDTIME 60 capsule 4  . tiZANidine (ZANAFLEX) 4 MG tablet TAKE 2 TABLETS (8 MG TOTAL) BY MOUTH EVERY 6 (SIX) HOURS AS NEEDED FOR MUSCLE SPASMS. 720  tablet 0  . traZODone (DESYREL) 50 MG tablet Take 2 tablets (100 mg total) by mouth at bedtime. 1  qhs  For 3 days if fails the 2  qhs 30 tablet 7  . zolpidem (AMBIEN) 10 MG tablet Take 1 tablet (10 mg total) by mouth at bedtime. 30 tablet 5  . aspirin 325 MG tablet Take 325 mg by mouth every 4 (four) hours as needed for headache.      No current facility-administered medications for this visit.     Neurologic: Headache: No Seizure: No Paresthesias:No  Musculoskeletal: Strength & Muscle Tone: within normal limits Gait & Station: normal Patient leans: N/A  Psychiatric Specialty Exam: ROS  Blood pressure 108/72, pulse 82, height 5\' 9"  (1.753 m), weight 197 lb 6.4 oz (89.5 kg), last menstrual period 08/23/2014, SpO2 97 %.Body mass index is 29.15 kg/m.  General Appearance: Casual  Eye Contact:  Good  Speech:  Clear and Coherent  Volume:  Normal  Mood:  Negative  Affect:  Appropriate  Thought Process:  Goal Directed  Orientation:  NA  Thought Content:  Logical  Suicidal Thoughts:  No  Homicidal Thoughts:  No  Memory:  Negative  Judgement:  Good  Insight:  Good  Psychomotor Activity:  Normal  Concentration:    Recall:    Fund of Knowledge:Good  Language: Good  Akathisia:  No  Handed:  Right  AIMS (if indicated):    Assets:  Desire for Improvement  ADL's:  Intact  Cognition: WNL  Sleep:      7/5/20199:30 AM  The patient will continue taking Cymbalta 20 mg twice a day, mixed ductal at 300 mg, Ambien 10 mg Restoril and trazodone. Fortunately the patient now is new psychotherapy will be very helpful. The patient is not suicidal. She is not psychotic. She'll continue taking all his medications and return to see me in 3 months for a 30 minutesession.

## 2018-05-23 ENCOUNTER — Encounter: Payer: Self-pay | Admitting: Primary Care

## 2018-05-24 ENCOUNTER — Other Ambulatory Visit (HOSPITAL_COMMUNITY): Payer: Self-pay | Admitting: Psychiatry

## 2018-05-24 MED ORDER — HYDROCODONE-ACETAMINOPHEN 10-325 MG PO TABS: 1.0000 | ORAL_TABLET | Freq: Four times a day (QID) | ORAL | 0 refills | Status: DC | PRN

## 2018-05-24 NOTE — Addendum Note (Signed)
Addended by: [name] on: 05/24/2018 04:23 PM   Modules accepted: Orders

## 2018-05-24 NOTE — Telephone Encounter (Signed)
Noted, new RX sent for [facility] fill date of 06/01/18. No suspicious activity noted on PMP aware. UDS is UTD.

## 2018-05-24 NOTE — Telephone Encounter (Signed)
[facility] calling and states that the patient is going out of town on 06/01/18 and is needing a refill before she leaves so she does not run out while on vacation. Pharmacy states that it is not available for refill until 06/05/18. States they would need a newprescription and the OK from the provider to fill on 06/01/18. Please advise.

## 2018-05-27 ENCOUNTER — Other Ambulatory Visit (INDEPENDENT_AMBULATORY_CARE_PROVIDER_SITE_OTHER): Payer: PPO

## 2018-05-27 DIAGNOSIS — Z79899 Other long term (current) drug therapy: Secondary | ICD-10-CM | POA: Diagnosis not present

## 2018-05-27 DIAGNOSIS — M1990 Unspecified osteoarthritis, unspecified site: Secondary | ICD-10-CM | POA: Diagnosis not present

## 2018-05-30 LAB — PAIN MGMT, PROFILE 8 W/CONF, U
6 ACETYLMORPHINE: NEGATIVE ng/mL (ref <10)
ALCOHOL METABOLITES: NEGATIVE ng/mL (ref <500)
ALPHAHYDROXYALPRAZOLAM: NEGATIVE ng/mL (ref <25)
ALPHAHYDROXYMIDAZOLAM: NEGATIVE ng/mL (ref <50)
ALPHAHYDROXYTRIAZOLAM: NEGATIVE ng/mL (ref <50)
AMPHETAMINES: NEGATIVE ng/mL (ref <500)
Aminoclonazepam: NEGATIVE ng/mL (ref <25)
BUPRENORPHINE, URINE: NEGATIVE ng/mL (ref <5)
Benzodiazepines: POSITIVE ng/mL — AB (ref <100)
CREATININE: 14.8 mg/dL — AB
Cocaine Metabolite: NEGATIVE ng/mL (ref <150)
Codeine: NEGATIVE ng/mL (ref <50)
HYDROCODONE: 171 ng/mL — AB (ref <50)
Hydromorphone: NEGATIVE ng/mL (ref <50)
Hydroxyethylflurazepam: NEGATIVE ng/mL (ref <50)
Lorazepam: NEGATIVE ng/mL (ref <50)
MDMA: NEGATIVE ng/mL (ref <500)
Marijuana Metabolite: NEGATIVE ng/mL (ref <20)
Morphine: NEGATIVE ng/mL (ref <50)
NORHYDROCODONE: 97 ng/mL — AB (ref <50)
Nordiazepam: NEGATIVE ng/mL (ref <50)
OXAZEPAM: 511 ng/mL — AB (ref <50)
OXIDANT: NEGATIVE ug/mL (ref <200)
Opiates: POSITIVE ng/mL — AB (ref <100)
Oxycodone: NEGATIVE ng/mL (ref <100)
PH: 6.61 (ref 4.5–9.0)
SPECIFIC GRAVITY: 1.003 (ref >=1.0)
Temazepam: 7960 ng/mL — ABNORMAL HIGH (ref <50)

## 2018-06-03 ENCOUNTER — Ambulatory Visit (HOSPITAL_COMMUNITY): Payer: Self-pay | Admitting: Psychiatry

## 2018-06-14 ENCOUNTER — Other Ambulatory Visit (HOSPITAL_COMMUNITY): Payer: Self-pay | Admitting: Psychiatry

## 2018-06-14 DIAGNOSIS — H26491 Other secondary cataract, right eye: Secondary | ICD-10-CM | POA: Diagnosis not present

## 2018-06-27 DIAGNOSIS — M1990 Unspecified osteoarthritis, unspecified site: Secondary | ICD-10-CM

## 2018-06-27 MED ORDER — HYDROCODONE-ACETAMINOPHEN 10-325 MG PO TABS: 1.0000 | ORAL_TABLET | Freq: Four times a day (QID) | ORAL | 0 refills | Status: DC | PRN

## 2018-07-11 ENCOUNTER — Ambulatory Visit (INDEPENDENT_AMBULATORY_CARE_PROVIDER_SITE_OTHER): Payer: PPO | Admitting: Family Medicine

## 2018-07-11 ENCOUNTER — Encounter: Payer: Self-pay | Admitting: Family Medicine

## 2018-07-11 VITALS — BP 98/62 | HR 78 | Temp 99.0°F | Ht 69.0 in | Wt 196.8 lb

## 2018-07-11 DIAGNOSIS — B372 Candidiasis of skin and nail: Secondary | ICD-10-CM

## 2018-07-11 MED ORDER — NAFTIFINE HCL 1 % EX CREA: TOPICAL_CREAM | Freq: Every day | CUTANEOUS | 0 refills | Status: DC

## 2018-07-11 NOTE — Patient Instructions (Signed)
I have sent in a medication to your pharmacy If it is too expensive, you can try a different over the counter medication like miconazole or ketoconazole If not better in 1 week, please let me or kate know  Intertrigo Intertrigo is skin irritation or inflammation (dermatitis) that occurs when folds of skin rub together. The irritation can cause a rash and make skin raw and itchy. This condition most commonly occurs in the skin folds of these areas:  Toes.  Armpits.  Groin.  Belly.  Breasts.  Buttocks.  Intertrigo is not passed from person to person (is not contagious). What are the causes? This condition is caused by heat, moisture, friction, and lack of air circulation. The condition can be made worse by:  Sweat.  Bacteria or a fungus, such as yeast.  What increases the risk? This condition is more likely to occur if you have moisture in your skin folds. It is also more likely to develop in people who:  Have diabetes.  Are overweight.  Are on bed rest.  Live in a warm and moist climate.  Wear splints, braces, or other medical devices.  Are not able to control their bowels or bladder (have incontinence).  What are the signs or symptoms? Symptoms of this condition include:  A pink or red skin rash.  Brown patches on the skin.  Raw or scaly skin.  Itchiness.  A burning feeling.  Bleeding.  Leaking fluid.  A bad smell.  How is this diagnosed? This condition is diagnosed with a medical history and physical exam. You may also have a skin swab to test for bacteria or a fungus, such as yeast. How is this treated? Treatment may include:  Cleaning and drying your skin.  An oral antibiotic medicine or antibiotic skin cream for a bacterial infection.  Antifungal cream or pills for an infection that was caused by a fungus, such as yeast.  Steroid ointment to relieve itchiness and irritation.  Follow these instructions at home:  Keep the affected area  clean and dry.  Do not scratch your skin.  Stay in a cool environment as much as possible. Use an air conditioner or fan, if available.  Apply over-the-counter and prescription medicines only as told by your health care provider.  If you were prescribed an antibiotic medicine, use it as told by your health care provider. Do not stop using the antibiotic even if your condition improves.  Keep all follow-up visits as told by your health care provider. This is important. How is this prevented?  Maintain a healthy weight.  Take care of your feet, especially if you have diabetes. Foot care includes: ? Wearing shoes that fit well. ? Keeping your feet dry. ? Wearing clean, breathable socks.  Protect the skin around your groin and buttocks, especially if you have incontinence. Skin protection includes: ? Following a regular cleaning routine. ? Using moisturizers and skin protectants. ? Changing protection pads frequently.  Do not wear tight clothes. Wear clothes that are loose and absorbent. Wear clothes that are made of cotton.  Wear a bra that gives good support, if needed.  Shower and dry yourself thoroughly after activity. Use a hair dryer on a cool setting to dry between skin folds, especially after you bathe.  If you have diabetes, keep your blood sugar under control. Contact a health care provider if:  Your symptoms do not improve with treatment.  Your symptoms get worse or they spread.  You notice increased redness and warmth.  You have a fever. This information is not intended to replace advice given to you by your health care provider. Make sure you discuss any questions you have with your health care provider. Document Released: 11/02/2005 Document Revised: 04/09/2016 Document Reviewed: 05/06/2015 Elsevier Interactive Patient Education  2018 [employer].

## 2018-07-11 NOTE — Progress Notes (Signed)
Subjective:    Patient ID: Emily Moon, female    DOB: 1961/09/19, 57 y.o.   MRN: [id number]  HPI This is a 58 yo female who presents today with a rash x 1 week. Rash is under abdomen. Seems to come and go, worse with heat and humidity. Has been using guaze and clotrimazole powder, some improvement today. Has also used some Neosporin once area became oozy. Has been careful to dry area thoroughly.   No history diabetes.   Past Medical History:  Diagnosis Date  . Arthritis    osteo  . Asthma   . Bipolar disorder ([place]) 05/21/14  . Cataracts, bilateral   . DDD (degenerative disc disease), cervical    also back  . Depression   . Dizziness    Positional  . Edema    feet/legs  . Fibromyalgia syndrome   . Fungal infection    Finger nails  . GERD (gastroesophageal reflux disease)   . Gout   . Headache    seasonal allergies  . Heart palpitations   . 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 Hypercholesterolemia   . Multiple sclerosis (HCC)    weakness  . Nephrolithiasis    kidney stones  . Osteoporosis    osteoarthritis  . Pneumonia   . PONV (postoperative nausea and vomiting)    no problem after cataract surgery  . Psoriasis   . Renal stone   . Shortness of breath dyspnea    wheezing  . Sleep apnea 2012   sleep study / slight, no interventions  . Urinary frequency    Past Surgical History:  Procedure Laterality Date  . CATARACT EXTRACTION W/PHACO Left 05/21/2015   Procedure: CATARACT EXTRACTION PHACO AND INTRAOCULAR LENS PLACEMENT (IOC);  Surgeon: Birder Robson, MD;  Location: [facility];  Service: Ophthalmology;  Laterality: Left;  [place] 00:35 AP% 22.9 CDE 8.11 fluid pack lot #[id number] H  . CATARACT EXTRACTION W/PHACO Right 06/04/2015   Procedure: CATARACT EXTRACTION PHACO AND INTRAOCULAR LENS PLACEMENT (IOC);  Surgeon: Birder Robson, MD;  Location: [facility];  Service: Ophthalmology;  Laterality: Right;  US:00:48 AP%: 10.5 CDE:5.08 Fluid lot #[id number] H  .  CYSTOSCOPY/URETEROSCOPY/HOLMIUM LASER/STENT PLACEMENT Bilateral 09/22/2016   Procedure: CYSTOSCOPY/URETEROSCOPY/HOLMIUM LASER/STENT PLACEMENT;  Surgeon: Hollice Espy, MD;  Location: [facility];  Service: Urology;  Laterality: Bilateral;  . EYE SURGERY  2015   tissue biopsy  . FOOT SURGERY  2015  . JOINT REPLACEMENT Left 2013   hip replacement  . LITHOTRIPSY    . PTOSIS REPAIR Bilateral 02/18/2016   Procedure: BILATERAL PTOSIS REPAIR UPPER EYELIDS;  Surgeon: Karle Starch, MD;  Location: [facility];  Service: Ophthalmology;  Laterality: Bilateral;  LEAVE PT EARLY AM  . thumb surgery Right   . TONSILLECTOMY  1973   Family History  Problem Relation Age of Onset  . Cancer Father        Abdomen with mastasis  . Cancer Mother   . Heart disease Mother   . Kidney disease Neg Hx   . Bladder Cancer Neg Hx   . Prostate cancer Neg Hx   . Kidney cancer Neg Hx    Social History   Tobacco Use  . Smoking status: Former Smoker    Years: 25.00    Types: Cigarettes    Last attempt to quit: 11/16/2012    Years since quitting: 5.6  . Smokeless tobacco: Never Used  . Tobacco comment: occasional use  Substance Use Topics  . Alcohol use: No    Alcohol/week:  0.0 standard drinks  . Drug use: No    Types: Methylphenidate      Review of Systems Per HPI    Objective:   Physical Exam  Constitutional: She appears well-developed and well-nourished.  HENT:  Head: Normocephalic and atraumatic.  Eyes: Conjunctivae are normal.  Cardiovascular: Normal rate.  Pulmonary/Chest: Effort normal.  Skin: Skin is warm and dry. Rash noted.  Area of lower abdomen with mildly erythematous linear rash, area on right side with mild peeling, no drainage.   Psychiatric: She has a normal mood and affect. Her behavior is normal. Judgment and thought content normal.  Vitals reviewed.    BP 98/62 (BP Location: Left Arm, Patient Position: Sitting, Cuff Size: Normal)   Pulse 78   Temp 99 F (37.2 C) (Oral)    Ht 5\' 9"  (1.753 m)   Wt 196 lb 12 oz (89.2 kg)   LMP 08/23/2014   SpO2 94%   BMI 29.05 kg/m      Assessment & Plan:  1. Candidal intertrigo - Provided written and verbal information regarding diagnosis and treatment. - RTC precautions reviewed - seems to be improving, will try antifungal cream, provided alternatives if RX too expensive - naftifine (NAFTIN) 1 % cream; Apply topically daily. For up to 4 weeks  Dispense: 60 g; Refill: 0  - she has follow up on file with her PCP for 2 weeks (already scheduled)  Clarene Reamer, FNP-BC  [facility] [place]  07/11/2018 10:06 AM

## 2018-07-12 ENCOUNTER — Telehealth: Payer: Self-pay | Admitting: Primary Care

## 2018-07-12 DIAGNOSIS — B372 Candidiasis of skin and nail: Secondary | ICD-10-CM

## 2018-07-12 MED ORDER — KETOCONAZOLE 2 % EX CREA: 1.0000 "application " | TOPICAL_CREAM | Freq: Every day | CUTANEOUS | 0 refills | Status: DC

## 2018-07-12 NOTE — Telephone Encounter (Signed)
Copied from Martha [id number]. Topic: General - Other >> Jul 12, 2018  8:44 AM Keene Breath wrote: Reason for CRM: Loel from the pharmacy called to inform doctor that the medication cream that was prescribed cost $90.  Would like to know if patient can get a less expensive medication, Keto comazole, which is only $15. Please advise.  CB# [phone].

## 2018-07-12 NOTE — Telephone Encounter (Signed)
Pt saw D Carlean Purl FNP who is out of office today on 08/26/19and Naftin 1% cream was prescribed.Please advise.  [facility]

## 2018-07-12 NOTE — Telephone Encounter (Signed)
Noted and office visit note reviewed. Rx for ketoconazole cream sent to pharmacy in place of naftifine cream.

## 2018-07-22 ENCOUNTER — Ambulatory Visit (INDEPENDENT_AMBULATORY_CARE_PROVIDER_SITE_OTHER): Payer: PPO | Admitting: Primary Care

## 2018-07-22 ENCOUNTER — Ambulatory Visit: Payer: Self-pay | Admitting: Primary Care

## 2018-07-22 ENCOUNTER — Telehealth: Payer: Self-pay | Admitting: Radiology

## 2018-07-22 VITALS — BP 126/82 | HR 92 | Temp 98.0°F | Ht 69.0 in | Wt 186.5 lb

## 2018-07-22 DIAGNOSIS — E559 Vitamin D deficiency, unspecified: Secondary | ICD-10-CM

## 2018-07-22 DIAGNOSIS — E538 Deficiency of other specified B group vitamins: Secondary | ICD-10-CM | POA: Diagnosis not present

## 2018-07-22 DIAGNOSIS — G894 Chronic pain syndrome: Secondary | ICD-10-CM | POA: Diagnosis not present

## 2018-07-22 DIAGNOSIS — M159 Polyosteoarthritis, unspecified: Secondary | ICD-10-CM

## 2018-07-22 DIAGNOSIS — R7303 Prediabetes: Secondary | ICD-10-CM

## 2018-07-22 DIAGNOSIS — M546 Pain in thoracic spine: Secondary | ICD-10-CM

## 2018-07-22 DIAGNOSIS — M1631 Unilateral osteoarthritis resulting from hip dysplasia, right hip: Secondary | ICD-10-CM

## 2018-07-22 DIAGNOSIS — G4730 Sleep apnea, unspecified: Secondary | ICD-10-CM

## 2018-07-22 DIAGNOSIS — M1990 Unspecified osteoarthritis, unspecified site: Secondary | ICD-10-CM

## 2018-07-22 DIAGNOSIS — G8929 Other chronic pain: Secondary | ICD-10-CM | POA: Diagnosis not present

## 2018-07-22 HISTORY — DX: Prediabetes: R73.03

## 2018-07-22 LAB — VITAMIN B12: Vitamin B-12: 336 pg/mL (ref 211–911)

## 2018-07-22 LAB — HEMOGLOBIN A1C: Hgb A1c MFr Bld: 5.9 % (ref 4.6–6.5)

## 2018-07-22 LAB — VITAMIN D 25 HYDROXY (VIT D DEFICIENCY, FRACTURES): VITD: 111.54 ng/mL — AB (ref 30.00–100.00)

## 2018-07-22 NOTE — Progress Notes (Signed)
Subjective:    Patient ID: Emily Moon, female    DOB: January 17, 1961, 57 y.o.   MRN: [id number]  HPI  Emily Moon is a 57 year old female who presents today for follow up.  1) Chronic Pain Syndrome: Currently managed on Norco 10/325 mg for which she takes every 6 hours for chronic pain to back, hips, neck. She is compliant to her Norco every 6 hours and feels as though she benefits. She did recently pass a kidney stone, didn't take any extra Norco as this typically doesn't help.  She is using Tizanidine 2-3 tablets every night at bedtime.   2) Sleep Apnea: Diagnosed in 2011. Never prescribed a CPAP machine. She snores loudly every night, confirmed by family. She does feel daytime drowsiness but is not sure if this is secondary to her medications or depression. She is interested in a sleep study.   Wt Readings from Last 3 Encounters:  07/22/18 186 lb 8 oz (84.6 kg)  07/11/18 196 lb 12 oz (89.2 kg)  05/06/18 191 lb 4 oz (86.8 kg)     3) Vitamin B 12 Deficiency: No recent injections. Is not taking any oral B 12. She is due for repeat labs today.  4) Vitamin D Deficiency: Currently taking 3000 units daily. Due for vitamin D labs today.   Review of Systems  Constitutional:       Chronic fatigue  Respiratory: Negative for shortness of breath.   Cardiovascular: Negative for chest pain.  Musculoskeletal:       Chronic pain to joints including hips, neck, back.       Past Medical History:  Diagnosis Date  . Arthritis    osteo  . Asthma   . Bipolar disorder ([place]) 05/21/14  . Cataracts, bilateral   . DDD (degenerative disc disease), cervical    also back  . Depression   . Dizziness    Positional  . Edema    feet/legs  . Fibromyalgia syndrome   . Fungal infection    Finger nails  . GERD (gastroesophageal reflux disease)   . Gout   . Headache    seasonal allergies  . Heart palpitations   . 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 Hypercholesterolemia   . Multiple  sclerosis ([facility])    weakness  . Nephrolithiasis    kidney stones  . Osteoporosis    osteoarthritis  . Pneumonia   . PONV (postoperative nausea and vomiting)    no problem after cataract surgery  . Psoriasis   . Renal stone   . Shortness of breath dyspnea    wheezing  . Sleep apnea 2012   sleep study / slight, no interventions  . Urinary frequency      Social History   Socioeconomic History  . Marital status: Divorced    Spouse name: Not on file  . Number of children: 1  . Years of education: Not on file  . Highest education level: Not on file  Occupational History  . Occupation: Therapist, art Rep at [employer]  . Financial resource strain: Not on file  . Food insecurity:    Worry: Not on file    Inability: Not on file  . Transportation needs:    Medical: Not on file    Non-medical: Not on file  Tobacco Use  . Smoking status: Former Smoker    Years: 25.00    Types: Cigarettes    Last attempt to quit:  11/16/2012    Years since quitting: 5.6  . Smokeless tobacco: Never Used  . Tobacco comment: occasional use  Substance and Sexual Activity  . Alcohol use: No    Alcohol/week: 0.0 standard drinks  . Drug use: No    Types: Methylphenidate  . Sexual activity: Never  Lifestyle  . Physical activity:    Days per week: Not on file    Minutes per session: Not on file  . Stress: Not on file  Relationships  . Social connections:    Talks on phone: Not on file    Gets together: Not on file    Attends religious service: Not on file    Active member of club or organization: Not on file    Attends meetings of clubs or organizations: Not on file    Relationship status: Not on file  . Intimate partner violence:    Fear of current or ex partner: Not on file    Emotionally abused: Not on file    Physically abused: Not on file    Forced sexual activity: Not on file  Other Topics Concern  . Not on file  Social History Narrative  . Not on  file    Past Surgical History:  Procedure Laterality Date  . CATARACT EXTRACTION W/PHACO Left 05/21/2015   Procedure: CATARACT EXTRACTION PHACO AND INTRAOCULAR LENS PLACEMENT (IOC);  Surgeon: Birder Robson, MD;  Location: [facility];  Service: Ophthalmology;  Laterality: Left;  [place] 00:35 AP% 22.9 CDE 8.11 fluid pack lot #[id number]  . CATARACT EXTRACTION W/PHACO Right 06/04/2015   Procedure: CATARACT EXTRACTION PHACO AND INTRAOCULAR LENS PLACEMENT (IOC);  Surgeon: Birder Robson, MD;  Location: [facility];  Service: Ophthalmology;  Laterality: Right;  US:00:48 AP%: 10.5 CDE:5.08 Fluid lot #[id number]  . CYSTOSCOPY/URETEROSCOPY/HOLMIUM LASER/STENT PLACEMENT Bilateral 09/22/2016   Procedure: CYSTOSCOPY/URETEROSCOPY/HOLMIUM LASER/STENT PLACEMENT;  Surgeon: Hollice Espy, MD;  Location: [facility] OR[facility];  Service: Urology;  Laterality: Bilateral;  . EYE SURGERY  2015   tissue biopsy  . FOOT SURGERY  2015  . JOINT REPLACEMENT Left 2013   hip replacement  . LITHOTRIPSY    . PTOSIS REPAIR Bilateral 02/18/2016   Procedure: BILATERAL PTOSIS REPAIR UPPER EYELIDS;  Surgeon: Karle Starch, MD;  Location: [place];  Service: Ophthalmology;  Laterality: Bilateral;  LEAVE PT EARLY AM  . thumb surgery Right   . TONSILLECTOMY  1973    Family History  Problem Relation Age of Onset  . Cancer Father        Abdomen with mastasis  . Cancer Mother   . Heart disease Mother   . Kidney disease Neg Hx   . Bladder Cancer Neg Hx   . Prostate cancer Neg Hx   . Kidney cancer Neg Hx     Allergies  Allergen Reactions  . Albuterol Shortness Of Breath and Other (See Comments)    Makes pt feel jittery/ tacycardic  . Crestor [Rosuvastatin] Other (See Comments)    Joint pain, muscle pain, and hair loss  . Halcion [Triazolam] Other (See Comments)    Dizziness,headaches,bladder problems  . Levaquin [Levofloxacin In D5w] Diarrhea and Itching    Shoulder pain  . Naproxen Sodium Swelling    Patient tolerates in  small doses  . Tylenol [Acetaminophen] Swelling    Patient tolerates in small doses  . Cefaclor Other (See Comments)    Doesn't remember---unsure if actually allergic   . Diclofenac Sodium Other (See Comments)    "made very sick"  . Sulfa Antibiotics Itching  Unsure of reaction possibly itching  . Tramadol Itching and Nausea And Vomiting  . Aripiprazole Other (See Comments)    Muscle tension/cramping  . Ibuprofen Swelling    Patient tolerates in small doses    Current Outpatient Medications on File Prior to Visit  Medication Sig Dispense Refill  . ASPERCREME LIDOCAINE EX Apply 1 application topically 4 (four) times daily as needed (for pain.).     Marland Kitchen aspirin 325 MG tablet Take 325 mg by mouth every 4 (four) hours as needed for headache.     Marland Kitchen BIOTIN PO Take 100 mg by mouth daily.    . calcipotriene-betamethasone (TACLONEX) ointment Apply 1 application topically daily as needed (for psorasis).     . Cholecalciferol (VITAMIN D3) 10000 units capsule Take 30,000 Units by mouth daily.     . clobetasol cream (TEMOVATE) 3.81 % Apply 1 application topically 2 (two) times daily. 30 g 2  . diclofenac sodium (VOLTAREN) 1 % GEL Apply 4 g topically 4 (four) times daily. (Patient taking differently: Apply 4 g topically 4 (four) times daily as needed (for pain.). ) 500 g 5  . docusate sodium (COLACE) 100 MG capsule Take 1 capsule (100 mg total) by mouth 2 (two) times daily. (Patient taking differently: Take 100 mg by mouth 2 (two) times daily as needed. ) 60 capsule 0  . DULoxetine (CYMBALTA) 20 MG capsule TAKE 2 CAPSULES (40 MG TOTAL) BY MOUTH AT BEDTIME. 180 capsule 3  . HYDROcodone-acetaminophen (NORCO) 10-325 MG tablet Take 1 tablet by mouth every 6 (six) hours as needed for moderate pain or severe pain. Supervising physician; Dr Eliezer Lofts; DEA# [id number] 120 tablet 0  . hydroquinone 4 % cream 1 APPLICATION TOPICALLY DAILY AS NEEDED FOR BLEMISHES.  3  . hydrOXYzine (VISTARIL) 50 MG capsule 1   q   Noon  1  q 6:00 pm  1  prn 90 capsule 5  . ketoconazole (NIZORAL) 2 % cream Apply 1 application topically daily. 30 g 0  . lamoTRIgine (LAMICTAL) 150 MG tablet Take 2 tablets (300 mg total) by mouth at bedtime. 60 tablet 5  . levalbuterol (XOPENEX HFA) 45 MCG/ACT inhaler Inhale 1-2 puffs into the lungs every 6 (six) hours as needed for wheezing. 1 Inhaler 5  . Lysine 1000 MG TABS Take 2,000-4,000 mg by mouth at bedtime. 2000 mg scheduled at bedtime and patient will take 4000 mg if she has outbreak    . Magnesium 500 MG TABS Take 1 tablet by mouth at bedtime.    Marland Kitchen neomycin-bacitracin-polymyxin (NEOSPORIN) [phone] ointment Apply 1 application topically 4 (four) times daily as needed (for cut/scrapes.).    Marland Kitchen nicotine (NICODERM CQ - DOSED IN MG/24 HOURS) 21 mg/24hr patch Place 1 patch (21 mg total) onto the skin daily. 28 patch 3  . phenazopyridine (AZO-TABS) 95 MG tablet Take 95 mg by mouth 3 (three) times daily as needed for pain.    . polyethylene glycol powder (GLYCOLAX/MIRALAX) powder MIX 17 GRAMS (1 CAPFUL) WITH 4-8 OZ OF LIQUID AND TAKE BY MOUTH TWICE DAILY AS NEEDED 527 g 0  . temazepam (RESTORIL) 30 MG capsule TAKE ONE TO TWO CAPSULES BY MOUTH NIGHTLY AT BEDTIME 60 capsule 4  . tiZANidine (ZANAFLEX) 4 MG tablet TAKE 2 TABLETS (8 MG TOTAL) BY MOUTH EVERY 6 (SIX) HOURS AS NEEDED FOR MUSCLE SPASMS. 720 tablet 0  . traZODone (DESYREL) 50 MG tablet Take 2 tablets (100 mg total) by mouth at bedtime. 1  qhs  For 3 days  if fails the 2  qhs 30 tablet 7  . zolpidem (AMBIEN) 10 MG tablet Take 1 tablet (10 mg total) by mouth at bedtime. 30 tablet 5  . cyanocobalamin (,VITAMIN B-12,) 1000 MCG/ML injection Inject 1 mL (1,000 mcg total) into the muscle every 30 (thirty) days. For 6 Months from 11/2017. (Patient not taking: Reported on 07/22/2018) 1 mL 0   No current facility-administered medications on file prior to visit.     BP 126/82   Pulse 92   Temp 98 F (36.7 C) (Oral)   Ht 5\' 9"  (1.753 m)    Wt 186 lb 8 oz (84.6 kg)   LMP 08/23/2014   SpO2 98%   BMI 27.54 kg/m    Objective:   Physical Exam  Constitutional: She appears well-nourished.  Neck: Neck supple.  Cardiovascular: Normal rate and regular rhythm.  Respiratory: Effort normal and breath sounds normal.  Skin: Skin is warm and dry.  Psychiatric: She has a normal mood and affect.           Assessment & Plan:

## 2018-07-22 NOTE — Assessment & Plan Note (Signed)
Repeat A1C pending. Has been taking a supplement which she bought at a health food store.

## 2018-07-22 NOTE — Assessment & Plan Note (Signed)
Doing well on Norco 10/325 mg every 6 hours for which she's taken for years. Do agree that she benefits. No suspicious activity noted on PMP Aware site. UDS is up to date and consistent with medication prescribed.   She doesn't need a refill today, has another 1-2 weeks remaining.   Follow up in 3 months for re-evaluation.

## 2018-07-22 NOTE — Patient Instructions (Addendum)
You will be contacted regarding your referral to pulmonology.  Please let us know if you have not been contacted within one week.   Stop by the lab prior to leaving today. I will notify you of your results once received.   Please notify me when you are running low on your pain medication.  Please schedule a follow up appointment in 3 months for pain management evaluation.  It was a pleasure to see you today!

## 2018-07-22 NOTE — Telephone Encounter (Signed)
Noted. Will notify patient via my chart. Plan is to reduce amount depending on how much she's actually taking.

## 2018-07-22 NOTE — Telephone Encounter (Signed)
[facility] lab called a critical Vit D - 111.54, results given to Allie Bossier

## 2018-07-22 NOTE — Assessment & Plan Note (Signed)
Repeat Vitamin B 12 pending.

## 2018-07-22 NOTE — Assessment & Plan Note (Signed)
Repeat Vitamin D pending. 

## 2018-07-28 ENCOUNTER — Encounter: Payer: Self-pay | Admitting: Internal Medicine

## 2018-07-28 ENCOUNTER — Ambulatory Visit: Payer: PPO | Admitting: Internal Medicine

## 2018-07-28 VITALS — BP 120/82 | HR 96 | Ht 69.0 in | Wt 188.0 lb

## 2018-07-28 DIAGNOSIS — G4719 Other hypersomnia: Secondary | ICD-10-CM | POA: Diagnosis not present

## 2018-07-28 DIAGNOSIS — Z720 Tobacco use: Secondary | ICD-10-CM

## 2018-07-28 DIAGNOSIS — F17210 Nicotine dependence, cigarettes, uncomplicated: Secondary | ICD-10-CM

## 2018-07-28 NOTE — Patient Instructions (Signed)
Plan for Home sleep study

## 2018-07-28 NOTE — Progress Notes (Signed)
Name: Emily Moon MRN: [id number] DOB: Apr 05, 1961     CONSULTATION DATE: 9.12.19 REFERRING MD : Carlis Abbott  CHIEF COMPLAINT: excessive daytime sleepiness  STUDIES:    11.20.18  CXR independently reviewed by Me today No acute pneumonia No effusions NL looking CXR   HISTORY OF PRESENT ILLNESS: 57 yo WF seen today for problems with sleep Patient  has been having sleep problems many years Patient has been having excessive daytime sleepiness Patient has been having extreme fatigue and tiredness, lack of energy +  very Loud snoring every night  Patient has had sleep study in 2012 and states that she was Dx with Mild OSA but has NOT received therapy She has dx of biPolar disorder and takes multiple medications for it She has insomnia and muscle cramps She also has dx of MS and sees neurologist for this-she is not on any current therapy at this time  She smokes 1/2 PPD Past 39 years Sister died of some kind of lung disease    Smoking Assessment and Cessation Counseling   Upon further questioning, Patient smokes 1/2 ppd  I have advised patient to quit/stop smoking as soon as possible due to high risk for multiple medical problems  Patient is willing to quit smoking  I have advised patient that we can assist and have options of Nicotine replacement therapy. I also advised patient on behavioral therapy and can provide oral medication therapy in conjunction with the other therapies  Follow up next Office visit  for assessment of smoking cessation  Smoking cessation counseling advised for 5 minutes    PAST MEDICAL HISTORY :   has a past medical history of Arthritis, Asthma, Bipolar disorder ([place]) (05/21/14), Cataracts, bilateral, DDD (degenerative disc disease), cervical, Depression, Dizziness, Edema, Fibromyalgia syndrome, Fungal infection, GERD (gastroesophageal reflux disease), Gout, Headache, Heart palpitations, Hip dysplasia, congenital (09/15/2013),  Hypercholesterolemia, Multiple sclerosis ([place]), Nephrolithiasis, Osteoporosis, Pneumonia, PONV (postoperative nausea and vomiting), Psoriasis, Renal stone, Shortness of breath dyspnea, Sleep apnea (2012), and Urinary frequency.  has a past surgical history that includes Lithotripsy; Cataract extraction w/PHACO (Left, 05/21/2015); Foot surgery (2015); Cataract extraction w/PHACO (Right, 06/04/2015); Tonsillectomy (1973); Eye surgery (2015); Joint replacement (Left, 2013); thumb surgery (Right); Ptosis repair (Bilateral, 02/18/2016); and Cystoscopy/ureteroscopy/holmium laser/stent placement (Bilateral, 09/22/2016). Prior to Admission medications   Medication Sig Start Date End Date Taking? Authorizing Provider  ASPERCREME LIDOCAINE EX Apply 1 application topically 4 (four) times daily as needed (for pain.).    Yes [provider]  aspirin 325 MG tablet Take 325 mg by mouth every 4 (four) hours as needed for headache.    Yes [provider]  BIOTIN PO Take 100 mg by mouth daily.   Yes [provider]  calcipotriene-betamethasone (TACLONEX) ointment Apply 1 application topically daily as needed (for psorasis).    Yes [provider]  Cholecalciferol (VITAMIN D3) 10000 units capsule Take 30,000 Units by mouth daily.    Yes [provider]  clobetasol cream (TEMOVATE) 9.32 % Apply 1 application topically 2 (two) times daily. 12/16/16  Yes Copland, Frederico Hamman, MD  cyanocobalamin (,VITAMIN B-12,) 1000 MCG/ML injection Inject 1 mL (1,000 mcg total) into the muscle every 30 (thirty) days. For 6 Months from 11/2017. 12/28/17  Yes [name], NP  diclofenac sodium (VOLTAREN) 1 % GEL Apply 4 g topically 4 (four) times daily. Patient taking differently: Apply 4 g topically 4 (four) times daily as needed (for pain.).  11/15/14  Yes Baity, Coralie Keens, NP  docusate sodium (COLACE)  100 MG capsule Take 1 capsule (100 mg total) by mouth 2 (two) times daily. Patient taking  differently: Take 100 mg by mouth 2 (two) times daily as needed.  09/22/16  Yes Hollice Espy, MD  DULoxetine (CYMBALTA) 20 MG capsule TAKE 2 CAPSULES (40 MG TOTAL) BY MOUTH AT BEDTIME. 05/27/18  Yes Plovsky, Berneta Sages, MD  HYDROcodone-acetaminophen (NORCO) 10-325 MG tablet Take 1 tablet by mouth every 6 (six) hours as needed for moderate pain or severe pain. Supervising physician; Dr Eliezer Lofts; DEA# [id number] 06/27/18  Yes [name], NP  hydroquinone 4 % cream 1 APPLICATION TOPICALLY DAILY AS NEEDED FOR BLEMISHES. 05/16/15  Yes [provider]  hydrOXYzine (VISTARIL) 50 MG capsule 1   q  Noon  1  q 6:00 pm  1  prn 02/09/18  Yes Plovsky, Berneta Sages, MD  ketoconazole (NIZORAL) 2 % cream Apply 1 application topically daily. 07/12/18  Yes [name], NP  lamoTRIgine (LAMICTAL) 150 MG tablet Take 2 tablets (300 mg total) by mouth at bedtime. 05/20/18  Yes Plovsky, Berneta Sages, MD  levalbuterol [facility] HFA) 45 MCG/ACT inhaler Inhale 1-2 puffs into the lungs every 6 (six) hours as needed for wheezing. 03/30/17  Yes Bedsole, Amy E, MD  Lysine 1000 MG TABS Take 2,000-4,000 mg by mouth at bedtime. 2000 mg scheduled at bedtime and patient will take 4000 mg if she has outbreak   Yes [provider]  Magnesium 500 MG TABS Take 1 tablet by mouth at bedtime.   Yes [provider]  neomycin-bacitracin-polymyxin (NEOSPORIN) [phone] ointment Apply 1 application topically 4 (four) times daily as needed (for cut/scrapes.).   Yes [provider]  nicotine (NICODERM CQ - DOSED IN MG/24 HOURS) 21 mg/24hr patch Place 1 patch (21 mg total) onto the skin daily. 02/20/16  Yes [name], NP  phenazopyridine (AZO-TABS) 95 MG tablet Take 95 mg by mouth 3 (three) times daily as needed for pain.   Yes [provider]  polyethylene glycol powder (GLYCOLAX/MIRALAX) powder MIX 17 GRAMS (1 CAPFUL) WITH 4-8 OZ OF LIQUID AND TAKE BY MOUTH TWICE DAILY AS NEEDED 11/24/17  Yes [name], NP  temazepam (RESTORIL) 30 MG capsule TAKE ONE TO TWO CAPSULES BY MOUTH NIGHTLY AT BEDTIME 05/20/18  Yes Plovsky, Berneta Sages, MD  tiZANidine (ZANAFLEX) 4 MG tablet TAKE 2 TABLETS (8 MG TOTAL) BY MOUTH EVERY 6 (SIX) HOURS AS NEEDED FOR MUSCLE SPASMS. 01/11/18  Yes [name], NP  traZODone (DESYREL) 50 MG tablet Take 2 tablets (100 mg total) by mouth at bedtime. 1  qhs  For 3 days if fails the 2  qhs 05/20/18  Yes Plovsky, Berneta Sages, MD  zolpidem (AMBIEN) 10 MG tablet Take 1 tablet (10 mg total) by mouth at bedtime. 05/20/18  Yes Norma Fredrickson, MD   Allergies  Allergen Reactions  . Albuterol Shortness Of Breath and Other (See Comments)    Makes pt feel jittery/ tacycardic  . Crestor [Rosuvastatin] Other (See Comments)    Joint pain, muscle pain, and hair loss  . Halcion [Triazolam] Other (See Comments)    Dizziness,headaches,bladder problems  . Levaquin [Levofloxacin In D5w] Diarrhea and Itching    Shoulder pain  . Naproxen Sodium Swelling    Patient tolerates in small doses  . Tylenol [Acetaminophen] Swelling    Patient tolerates in small doses  . Cefaclor Other (See Comments)    Doesn't remember---unsure if actually allergic   . Diclofenac Sodium Other (See Comments)    "made very  sick"  . Sulfa Antibiotics Itching    Unsure of reaction possibly itching  . Tramadol Itching and Nausea And Vomiting  . Aripiprazole Other (See Comments)    Muscle tension/cramping  . Ibuprofen Swelling    Patient tolerates in small doses    FAMILY HISTORY:  family history includes Cancer in her father and mother; Heart disease in her mother. SOCIAL HISTORY:  reports that she quit smoking about 5 years ago. Her smoking use included cigarettes. She quit after 25.00 years of use. She has never used smokeless tobacco. She reports that she does not drink alcohol or use drugs.  REVIEW OF SYSTEMS:   Constitutional: Negative for fever, chills, weight loss, malaise/fatigue and diaphoresis.  HENT:  Negative for hearing loss, ear pain, nosebleeds, congestion, sore throat, neck pain, tinnitus and ear discharge.   Eyes: Negative for blurred vision, double vision, photophobia, pain, discharge and redness.  Respiratory: Negative for cough, hemoptysis, sputum production, shortness of breath, wheezing and stridor.   Cardiovascular: Negative for chest pain, palpitations, orthopnea, claudication, leg swelling and PND.  Gastrointestinal: Negative for heartburn, nausea, vomiting, abdominal pain, diarrhea, constipation, blood in stool and melena.  Genitourinary: Negative for dysuria, urgency, frequency, hematuria and flank pain.  Musculoskeletal: Negative for myalgias, back pain, joint pain and falls.  Skin: Negative for itching and rash.  Neurological: Negative for dizziness, tingling, tremors, sensory change, speech change, focal weakness, seizures, loss of consciousness, weakness and headaches.  Endo/Heme/Allergies: Negative for environmental allergies and polydipsia. Does not bruise/bleed easily.  ALL OTHER ROS ARE NEGATIVE   BP 120/82 (BP Location: Left Arm, Cuff Size: Normal)   Pulse 96   Ht 5\' 9"  (1.753 m)   Wt 188 lb (85.3 kg)   LMP 08/23/2014   SpO2 96%   BMI 27.76 kg/m    Physical Examination:   GENERAL:NAD, no fevers, chills, no weakness no fatigue HEAD: Normocephalic, atraumatic.  EYES: Pupils equal, round, reactive to light. Extraocular muscles intact. No scleral icterus.  MOUTH: Moist mucosal membrane.   EAR, NOSE, THROAT: Clear without exudates. No external lesions.  NECK: Supple. No thyromegaly. No nodules. No JVD.  PULMONARY:CTA B/L no wheezes, no crackles, no rhonchi CARDIOVASCULAR: S1 and S2. Regular rate and rhythm. No murmurs, rubs, or gallops. No edema.  GASTROINTESTINAL: Soft, nontender, nondistended. No masses. Positive bowel sounds.  MUSCULOSKELETAL: No swelling, clubbing, or edema. Range of motion full in all extremities.  NEUROLOGIC: Cranial nerves II through  XII are intact. No gross focal neurological deficits.  SKIN: No ulceration, lesions, rashes, or cyanosis. Skin warm and dry. Turgor intact.  PSYCHIATRIC: Mood, affect within normal limits. The patient is awake, alert and oriented x 3. Insight, judgment intact.      ASSESSMENT / PLAN: 57 yo WF with signs and symptoms of snoring and excessive daytime sleepiness and fatigue with prior history of OSA in setting of psychiatric illness and bipolar disorder with h/o MS  Plan for Home Sleep study ASAP Follow up Neurology as scheduled Follow up psychiatry as scheduled Smoking cessation strongly advised  I have explained to patient the mode of therapy which includes wearing apparatus over her nose/face. She does NOT know if she would tolerate this process. I suggest lets get sleep study and then assess   Patient satisfied with Plan of action and management. All questions answered Follow up after tests completed   Corrin Parker, M.D.  [facility]  Medical Director [place] Director [facility] Cardio-Pulmonary Department

## 2018-08-01 MED ORDER — HYDROCODONE-ACETAMINOPHEN 10-325 MG PO TABS: 1.0000 | ORAL_TABLET | Freq: Four times a day (QID) | ORAL | 0 refills | Status: DC | PRN

## 2018-08-08 MED ORDER — CYANOCOBALAMIN 1000 MCG/ML IJ SOLN: 1000.0000 ug | INTRAMUSCULAR | 0 refills | Status: DC

## 2018-08-08 NOTE — Telephone Encounter (Signed)
Copied from [place] [id number]. Topic: Inquiry >> Aug 08, 2018 12:17 PM Mylinda Latina, NT wrote: Reason for CRM: patient called and states she needs her b12 injections. Patient states it has been a while since she gotten one. Please call to schedule CB# [phone]

## 2018-08-08 NOTE — Telephone Encounter (Signed)
Per 07/22/18 pt message; Gentry Fitz NP said can schedule pt Vit B 12 injection every 3 months. appt scheduled 08/11/18.nothing further needed.

## 2018-08-09 ENCOUNTER — Telehealth: Payer: Self-pay | Admitting: Internal Medicine

## 2018-08-09 NOTE — Telephone Encounter (Signed)
Patient calling to check on status of scheduling sleep study  Please call to discuss.

## 2018-08-09 NOTE — Telephone Encounter (Signed)
Called and spoke with patient and advised that HTA does not require PA for HST. Appointment was scheduled for Friday 08/26/18 when patient was here for appointment.   Pt is aware to pick up device on Friday 08/26/18 between 3:30-4:30 pm from the [place] and to ask for Francis. Pt voiced understanding and nothing else needed at this time. Rhonda J Cobb

## 2018-08-11 ENCOUNTER — Ambulatory Visit: Payer: PPO

## 2018-08-16 ENCOUNTER — Ambulatory Visit (INDEPENDENT_AMBULATORY_CARE_PROVIDER_SITE_OTHER): Payer: PPO | Admitting: Emergency Medicine

## 2018-08-16 DIAGNOSIS — E538 Deficiency of other specified B group vitamins: Secondary | ICD-10-CM

## 2018-08-16 MED ORDER — CYANOCOBALAMIN 1000 MCG/ML IJ SOLN
1000.0000 ug | Freq: Once | INTRAMUSCULAR | Status: AC
  Administered 2018-08-16: 1000 ug via INTRAMUSCULAR

## 2018-08-16 NOTE — Progress Notes (Signed)
Per orders of Allie Bossier , injection of B12 given by Elmon Kirschner. Patient tolerated injection well. Received injection in in left deltoid per patients request.

## 2018-08-18 DIAGNOSIS — R413 Other amnesia: Secondary | ICD-10-CM | POA: Diagnosis not present

## 2018-08-18 DIAGNOSIS — G35 Multiple sclerosis: Secondary | ICD-10-CM | POA: Diagnosis not present

## 2018-08-19 ENCOUNTER — Other Ambulatory Visit (HOSPITAL_COMMUNITY): Payer: Self-pay | Admitting: Psychiatry

## 2018-08-24 ENCOUNTER — Ambulatory Visit (INDEPENDENT_AMBULATORY_CARE_PROVIDER_SITE_OTHER): Payer: PPO | Admitting: Family Medicine

## 2018-08-24 ENCOUNTER — Encounter: Payer: Self-pay | Admitting: Family Medicine

## 2018-08-24 VITALS — BP 90/66 | HR 91 | Temp 98.7°F | Ht 69.0 in | Wt 187.0 lb

## 2018-08-24 DIAGNOSIS — G35 Multiple sclerosis: Secondary | ICD-10-CM | POA: Diagnosis not present

## 2018-08-24 DIAGNOSIS — R2689 Other abnormalities of gait and mobility: Secondary | ICD-10-CM

## 2018-08-24 DIAGNOSIS — R29898 Other symptoms and signs involving the musculoskeletal system: Secondary | ICD-10-CM

## 2018-08-24 DIAGNOSIS — M76811 Anterior tibial syndrome, right leg: Secondary | ICD-10-CM

## 2018-08-24 NOTE — Progress Notes (Signed)
Dr. Frederico Hamman T. Chastin Garlitz, MD, [facility] [place], [address] Phone: [phone] Fax: [phone]  08/24/2018  Patient: Emily Moon, MRN: [id number], DOB: 07/04/1961, 57 y.o.  Primary Physician:  [name], NP   Chief Complaint  Patient presents with  . Ankle Pain    Right   Subjective:   Emily Moon is a 57 y.o. very pleasant female patient who presents with the following:  She is a very well-known patient, and I have known her for years.  She presents now with some anterior shin pain as well as some anterior ankle pain.  She has recently started to increase her exercise patterns, and has been doing some Silver sneakers for about the last 5 weeks.  At baseline she does have some multiple sclerosis which has been advancing in recent years and she does have some baseline weakness and balance disturbance in her lower extremities.  She also has had fractures of both of her ankles in the past.  Now she is having some pain in the anterior aspect of her shin also.  There is no bruising or no distinct injury.  R anterior ankle. Has broken it a couple of time.  tib anterior irritation Doing some silver sneakers.   Shoulders B.  She also continues to have some shoulder pain, and she is having some pain in the [facility] joint as well as some pain with terminal abduction.  Past Medical History, Surgical History, Social History, Family History, Problem List, Medications, and Allergies have been reviewed and updated if relevant.  Patient Active Problem List   Diagnosis Date Noted  . Bipolar disorder ([place]) 05/21/2014    Priority: High  . MULTIPLE SCLEROSIS, PROGRESSIVE/RELAPSING 08/28/2010    Priority: High  . Chronic pain syndrome 01/07/2011    Priority: Medium  . Prediabetes 07/22/2018  . Bipolar I disorder, most recent episode (or current) manic ([place]) 05/20/2018  . Vitamin D deficiency 02/15/2018  .  Preventative health care 11/24/2017  . Cough 03/30/2017  . Urinary frequency 03/30/2017  . B12 deficiency 05/22/2016  . Dizziness 03/16/2016  . Medicare annual wellness visit, subsequent 10/25/2015  . Back pain 10/31/2014  . 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 Insomnia 09/01/2011  . Obesity 05/04/2011  . HLD (hyperlipidemia) 01/20/2011  . SMOKER 01/20/2011  . RENAL CALCULUS, RECURRENT 11/13/2010  . GERD 08/28/2010  . Osteoarthritis 08/28/2010  . NEPHROLITHIASIS, HX OF 08/28/2010    Past Medical History:  Diagnosis Date  . Arthritis    osteo  . Asthma   . Bipolar disorder ([place]) 05/21/14  . Cataracts, bilateral   . DDD (degenerative disc disease), cervical    also back  . Depression   . Dizziness    Positional  . Edema    feet/legs  . Fibromyalgia syndrome   . Fungal infection    Finger nails  . GERD (gastroesophageal reflux disease)   . Gout   . Headache    seasonal allergies  . Heart palpitations   . 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 Hypercholesterolemia   . Multiple sclerosis ([facility])    weakness  . Nephrolithiasis    kidney stones  . Osteoporosis    osteoarthritis  . Pneumonia   . PONV (postoperative nausea and vomiting)    no problem after cataract surgery  . Psoriasis   . Renal stone   .  Shortness of breath dyspnea    wheezing  . Sleep apnea 2012   sleep study / slight, no interventions  . Urinary frequency     Past Surgical History:  Procedure Laterality Date  . CATARACT EXTRACTION W/PHACO Left 05/21/2015   Procedure: CATARACT EXTRACTION PHACO AND INTRAOCULAR LENS PLACEMENT (IOC);  Surgeon: Birder Robson, MD;  Location: [facility];  Service: Ophthalmology;  Laterality: Left;  [place] 00:35 AP% 22.9 CDE 8.11 fluid pack lot #[id number]  . CATARACT EXTRACTION W/PHACO Right 06/04/2015   Procedure: CATARACT EXTRACTION PHACO AND INTRAOCULAR LENS  PLACEMENT (IOC);  Surgeon: Birder Robson, MD;  Location: [facility];  Service: Ophthalmology;  Laterality: Right;  US:00:48 AP%: 10.5 CDE:5.08 Fluid lot #[id number]  . CYSTOSCOPY/URETEROSCOPY/HOLMIUM LASER/STENT PLACEMENT Bilateral 09/22/2016   Procedure: CYSTOSCOPY/URETEROSCOPY/HOLMIUM LASER/STENT PLACEMENT;  Surgeon: Hollice Espy, MD;  Location: [facility] OR[facility];  Service: Urology;  Laterality: Bilateral;  . EYE SURGERY  2015   tissue biopsy  . FOOT SURGERY  2015  . JOINT REPLACEMENT Left 2013   hip replacement  . LITHOTRIPSY    . PTOSIS REPAIR Bilateral 02/18/2016   Procedure: BILATERAL PTOSIS REPAIR UPPER EYELIDS;  Surgeon: Karle Starch, MD;  Location: [facility];  Service: Ophthalmology;  Laterality: Bilateral;  LEAVE PT EARLY AM  . thumb surgery Right   . TONSILLECTOMY  1973    Social History   Socioeconomic History  . Marital status: Divorced    Spouse name: Not on file  . Number of children: 1  . Years of education: Not on file  . Highest education level: Not on file  Occupational History  . Occupation: Therapist, art Rep at [employer]  . Financial resource strain: Not on file  . Food insecurity:    Worry: Not on file    Inability: Not on file  . Transportation needs:    Medical: Not on file    Non-medical: Not on file  Tobacco Use  . Smoking status: Former Smoker    Years: 25.00    Types: Cigarettes    Last attempt to quit: 11/16/2012    Years since quitting: 5.7  . Smokeless tobacco: Never Used  . Tobacco comment: occasional use  Substance and Sexual Activity  . Alcohol use: No    Alcohol/week: 0.0 standard drinks  . Drug use: No    Types: Methylphenidate  . Sexual activity: Never  Lifestyle  . Physical activity:    Days per week: Not on file    Minutes per session: Not on file  . Stress: Not on file  Relationships  . Social connections:    Talks on phone: Not on file    Gets together: Not on file    Attends  religious service: Not on file    Active member of club or organization: Not on file    Attends meetings of clubs or organizations: Not on file    Relationship status: Not on file  . Intimate partner violence:    Fear of current or ex partner: Not on file    Emotionally abused: Not on file    Physically abused: Not on file    Forced sexual activity: Not on file  Other Topics Concern  . Not on file  Social History Narrative  . Not on file    Family History  Problem Relation Age of Onset  . Cancer Father        Abdomen with mastasis  . Cancer Mother   .  Heart disease Mother   . Kidney disease Neg Hx   . Bladder Cancer Neg Hx   . Prostate cancer Neg Hx   . Kidney cancer Neg Hx     Allergies  Allergen Reactions  . Albuterol Shortness Of Breath and Other (See Comments)    Makes pt feel jittery/ tacycardic  . Crestor [Rosuvastatin] Other (See Comments)    Joint pain, muscle pain, and hair loss  . Halcion [Triazolam] Other (See Comments)    Dizziness,headaches,bladder problems  . Levaquin [Levofloxacin In D5w] Diarrhea and Itching    Shoulder pain  . Naproxen Sodium Swelling    Patient tolerates in small doses  . Tylenol [Acetaminophen] Swelling    Patient tolerates in small doses  . Cefaclor Other (See Comments)    Doesn't remember---unsure if actually allergic   . Diclofenac Sodium Other (See Comments)    "made very sick"  . Sulfa Antibiotics Itching    Unsure of reaction possibly itching  . Tramadol Itching and Nausea And Vomiting  . Aripiprazole Other (See Comments)    Muscle tension/cramping  . Ibuprofen Swelling    Patient tolerates in small doses    Medication list reviewed and updated in full in [place].  GEN: No fevers, chills. Nontoxic. Primarily MSK c/o today. MSK: Detailed in the HPI GI: tolerating PO intake without difficulty Neuro: No numbness, parasthesias, or tingling associated. Otherwise the pertinent positives of the ROS are noted  above.   Objective:   BP 90/66   Pulse 91   Temp 98.7 F (37.1 C) (Oral)   Ht 5\' 9"  (1.753 m)   Wt 187 lb (84.8 kg)   LMP 08/23/2014   BMI 27.62 kg/m    GEN: WDWN, NAD, Non-toxic, Alert & Oriented x 3 HEENT: Atraumatic, Normocephalic.  Ears and Nose: No external deformity. EXTR: No clubbing/cyanosis/edema NEURO: Normal gait.  PSYCH: Normally interactive. Conversant. Not depressed or anxious appearing.  Calm demeanor.    Entirety of the bony anatomy in the ankle and foot is nontender.  Nontender along the tibia and fibula bilaterally.  Patient does have tenderness along the tibialis anterior musculature on the right side and also to a lesser extent adjacent to the tibia.  Along the medial tibia there is minimal tenderness.  There is no tenderness to percussion or to tuning fork.  There is some mild tenderness along the posterior tibialis tendon on the right also too, to a much lesser extent.  There is also some mild irritation at the Achilles tendon insertion.  str 4-/5 diffusely b le, slightly worse on the R  Radiology: No results found.  Assessment and Plan:   Anterior tibial tendonitis, right  MULTIPLE SCLEROSIS, PROGRESSIVE/RELAPSING  Weakness of both lower extremities  Balance problem  I think that she primarily has some overuse injury of the tibialis anterior as well as the distal tendons, probably from recently resuming some activity after a layoff.  She also has some baseline balance issues as well as some weakness compared to baseline secondary to her MS, which I think set her up for this.  Massage, heat, NSAIDs, and also recommended that she get a neoprene ankle support for when she is exercising.  Take the next few days off and then resume on Monday.  Follow-up: No follow-ups on file.  Signed,  Maud Deed. Nakyla Bracco, MD   Allergies as of 08/24/2018      Reactions   Albuterol Shortness Of Breath, Other (See Comments)   Makes pt feel  jittery/ tacycardic    Crestor [rosuvastatin] Other (See Comments)   Joint pain, muscle pain, and hair loss   Halcion [triazolam] Other (See Comments)   Dizziness,headaches,bladder problems   Levaquin [levofloxacin In D5w] Diarrhea, Itching   Shoulder pain   Naproxen Sodium Swelling   Patient tolerates in small doses   Tylenol [acetaminophen] Swelling   Patient tolerates in small doses   Cefaclor Other (See Comments)   Doesn't remember---unsure if actually allergic    Diclofenac Sodium Other (See Comments)   "made very sick"   Sulfa Antibiotics Itching   Unsure of reaction possibly itching   Tramadol Itching, Nausea And Vomiting   Aripiprazole Other (See Comments)   Muscle tension/cramping   Ibuprofen Swelling   Patient tolerates in small doses      Medication List        Accurate as of 08/24/18  2:09 PM. Always use your most recent med list.          ASPERCREME LIDOCAINE EX Apply 1 application topically 4 (four) times daily as needed (for pain.).   aspirin 325 MG tablet Take 325 mg by mouth every 4 (four) hours as needed for headache.   AZO-TABS 95 MG tablet Generic drug:  phenazopyridine Take 95 mg by mouth 3 (three) times daily as needed for pain.   BIOTIN PO Take 100 mg by mouth daily.   calcipotriene-betamethasone ointment Commonly known as:  TACLONEX Apply 1 application topically daily as needed (for psorasis).   clobetasol cream 0.05 % Commonly known as:  TEMOVATE Apply 1 application topically 2 (two) times daily.   cyanocobalamin 1000 MCG/ML injection Commonly known as:  (VITAMIN B-12) Inject 1 mL (1,000 mcg total) into the muscle every 3 (three) months.   diclofenac sodium 1 % Gel Commonly known as:  VOLTAREN Apply 4 g topically 4 (four) times daily.   docusate sodium 100 MG capsule Commonly known as:  COLACE Take 1 capsule (100 mg total) by mouth 2 (two) times daily.   DULoxetine 20 MG capsule Commonly known as:  CYMBALTA TAKE 2 CAPSULES (40 MG TOTAL) BY MOUTH AT  BEDTIME.   HYDROcodone-acetaminophen 10-325 MG tablet Commonly known as:  NORCO Take 1 tablet by mouth every 6 (six) hours as needed for moderate pain or severe pain. Supervising physician; Dr Eliezer Lofts; DEA# [id number]   hydroquinone 4 % cream 1 APPLICATION TOPICALLY DAILY AS NEEDED FOR BLEMISHES.   hydrOXYzine 50 MG capsule Commonly known as:  VISTARIL 1   q  Noon  1  q 6:00 pm  1  prn   ketoconazole 2 % cream Commonly known as:  NIZORAL Apply 1 application topically daily.   lamoTRIgine 150 MG tablet Commonly known as:  LAMICTAL Take 2 tablets (300 mg total) by mouth at bedtime.   levalbuterol 45 MCG/ACT inhaler Commonly known as:  XOPENEX HFA Inhale 1-2 puffs into the lungs every 6 (six) hours as needed for wheezing.   Lysine 1000 MG Tabs Take 2,000-4,000 mg by mouth at bedtime. 2000 mg scheduled at bedtime and patient will take 4000 mg if she has outbreak   Magnesium 500 MG Tabs Take 1 tablet by mouth at bedtime.   neomycin-bacitracin-polymyxin [phone] ointment Apply 1 application topically 4 (four) times daily as needed (for cut/scrapes.).   nicotine 21 mg/24hr patch Commonly known as:  NICODERM CQ - dosed in mg/24 hours Place 1 patch (21 mg total) onto the skin daily.   polyethylene glycol powder powder Commonly known as:  GLYCOLAX/MIRALAX MIX  17 GRAMS (1 CAPFUL) WITH 4-8 OZ OF LIQUID AND TAKE BY MOUTH TWICE DAILY AS NEEDED   temazepam 30 MG capsule Commonly known as:  RESTORIL TAKE ONE TO TWO CAPSULES BY MOUTH NIGHTLY AT BEDTIME   tiZANidine 4 MG tablet Commonly known as:  ZANAFLEX TAKE 2 TABLETS (8 MG TOTAL) BY MOUTH EVERY 6 (SIX) HOURS AS NEEDED FOR MUSCLE SPASMS.   traZODone 50 MG tablet Commonly known as:  DESYREL Take 2 tablets (100 mg total) by mouth at bedtime. 1  qhs  For 3 days if fails the 2  qhs   Vitamin D3 10000 units capsule Take 30,000 Units by mouth daily.   zolpidem 10 MG tablet Commonly known as:  AMBIEN Take 1 tablet (10 mg  total) by mouth at bedtime.

## 2018-08-26 ENCOUNTER — Ambulatory Visit (INDEPENDENT_AMBULATORY_CARE_PROVIDER_SITE_OTHER): Payer: PPO | Admitting: Primary Care

## 2018-08-26 ENCOUNTER — Encounter: Payer: Self-pay | Admitting: Primary Care

## 2018-08-26 VITALS — BP 100/70 | HR 95 | Temp 98.7°F | Ht 69.0 in | Wt 185.2 lb

## 2018-08-26 DIAGNOSIS — R05 Cough: Secondary | ICD-10-CM

## 2018-08-26 DIAGNOSIS — Z720 Tobacco use: Secondary | ICD-10-CM | POA: Diagnosis not present

## 2018-08-26 DIAGNOSIS — R350 Frequency of micturition: Secondary | ICD-10-CM | POA: Diagnosis not present

## 2018-08-26 DIAGNOSIS — M1990 Unspecified osteoarthritis, unspecified site: Secondary | ICD-10-CM

## 2018-08-26 DIAGNOSIS — R059 Cough, unspecified: Secondary | ICD-10-CM

## 2018-08-26 DIAGNOSIS — R109 Unspecified abdominal pain: Secondary | ICD-10-CM

## 2018-08-26 DIAGNOSIS — M159 Polyosteoarthritis, unspecified: Secondary | ICD-10-CM

## 2018-08-26 DIAGNOSIS — G4733 Obstructive sleep apnea (adult) (pediatric): Secondary | ICD-10-CM | POA: Diagnosis not present

## 2018-08-26 DIAGNOSIS — M1631 Unilateral osteoarthritis resulting from hip dysplasia, right hip: Secondary | ICD-10-CM | POA: Diagnosis not present

## 2018-08-26 LAB — POC URINALSYSI DIPSTICK (AUTOMATED)
Bilirubin, UA: NEGATIVE
GLUCOSE UA: NEGATIVE
Ketones, UA: NEGATIVE
Leukocytes, UA: NEGATIVE
Nitrite, UA: NEGATIVE
PH UA: 6 (ref 5.0–8.0)
Protein, UA: NEGATIVE
Urobilinogen, UA: 0.2 E.U./dL

## 2018-08-26 MED ORDER — LEVALBUTEROL TARTRATE 45 MCG/ACT IN AERO: 1.0000 | INHALATION_SPRAY | Freq: Four times a day (QID) | RESPIRATORY_TRACT | 0 refills | Status: DC | PRN

## 2018-08-26 NOTE — Assessment & Plan Note (Signed)
Patient requesting PT again for chronic right hip pain as this was beneficial. Referral placed.

## 2018-08-26 NOTE — Patient Instructions (Signed)
I sent a refill of your Xopenex inhaler to the pharmacy. Please notify me if you continue to require use of the inhaler more than three times weekly.  Continue to push intake of water.  Follow up with your Urologist as discussed.  You will be contacted regarding your referral to physical therapy.  Please let us know if you have not been contacted within one week.   It was a pleasure to see you today!

## 2018-08-26 NOTE — Assessment & Plan Note (Signed)
Quit smoking cigarettes in July 2019, now vaping. Discussed potential dangers of vaping and discouraged use. Refill provided for Xopenex as requested. If she continues to require additional refills then we will need to consider spirometry.

## 2018-08-26 NOTE — Progress Notes (Signed)
Subjective:    Patient ID: Emily Moon, female    DOB: May 12, 1961, 57 y.o.   MRN: [id number]  HPI  Ms. Innocent is a 57 year old female with a history of renal stones and UTI who presents today with a chief complaint of urinary frequency. She would also like to try physical therapy again for her chronic right hip pain as she found improvement. She is also needing a refill of her levalbuterol (Xopenex) inhaler. She has been using this more frequently during seasonal changes. She cannot tolerate albuterol as it causes her to feel jittery.   She also reports some dysuria, bilateral flank pain. Her symptoms began one week ago. She denies vaginal itching, vaginal discharge, hematuria. She's taken one dose of AZO. She plans on scheduling an appointment with her Urologist. She is leaving for a trip next Thursday.     Review of Systems  Respiratory:       Recent shortness of breath when outdoors and during exercise  Gastrointestinal: Negative for abdominal pain and nausea.  Genitourinary: Positive for dysuria and flank pain. Negative for hematuria, pelvic pain and vaginal discharge.  Musculoskeletal: Positive for arthralgias.  Allergic/Immunologic: Positive for environmental allergies.       Past Medical History:  Diagnosis Date  . Arthritis    osteo  . Asthma   . Bipolar disorder ([place]) 05/21/14  . Cataracts, bilateral   . DDD (degenerative disc disease), cervical    also back  . Depression   . Dizziness    Positional  . Edema    feet/legs  . Fibromyalgia syndrome   . Fungal infection    Finger nails  . GERD (gastroesophageal reflux disease)   . Gout   . Headache    seasonal allergies  . Heart palpitations   . 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 Hypercholesterolemia   . Multiple sclerosis (HCC)    weakness  . Nephrolithiasis    kidney stones  . Osteoporosis    osteoarthritis  . Pneumonia   . PONV (postoperative nausea and vomiting)    no problem after cataract  surgery  . Psoriasis   . Renal stone   . Shortness of breath dyspnea    wheezing  . Sleep apnea 2012   sleep study / slight, no interventions  . Urinary frequency      Social History   Socioeconomic History  . Marital status: Divorced    Spouse name: Not on file  . Number of children: 1  . Years of education: Not on file  . Highest education level: Not on file  Occupational History  . Occupation: Therapist, art Rep at [employer]  . Financial resource strain: Not on file  . Food insecurity:    Worry: Not on file    Inability: Not on file  . Transportation needs:    Medical: Not on file    Non-medical: Not on file  Tobacco Use  . Smoking status: Former Smoker    Years: 25.00    Types: Cigarettes    Last attempt to quit: 11/16/2012    Years since quitting: 5.7  . Smokeless tobacco: Never Used  . Tobacco comment: occasional use  Substance and Sexual Activity  . Alcohol use: No    Alcohol/week: 0.0 standard drinks  . Drug use: No    Types: Methylphenidate  . Sexual activity: Never  Lifestyle  . Physical activity:    Days per week: Not  on file    Minutes per session: Not on file  . Stress: Not on file  Relationships  . Social connections:    Talks on phone: Not on file    Gets together: Not on file    Attends religious service: Not on file    Active member of club or organization: Not on file    Attends meetings of clubs or organizations: Not on file    Relationship status: Not on file  . Intimate partner violence:    Fear of current or ex partner: Not on file    Emotionally abused: Not on file    Physically abused: Not on file    Forced sexual activity: Not on file  Other Topics Concern  . Not on file  Social History Narrative  . Not on file    Past Surgical History:  Procedure Laterality Date  . CATARACT EXTRACTION W/PHACO Left 05/21/2015   Procedure: CATARACT EXTRACTION PHACO AND INTRAOCULAR LENS PLACEMENT (IOC);   Surgeon: Birder Robson, MD;  Location: [facility];  Service: Ophthalmology;  Laterality: Left;  [place] 00:35 AP% 22.9 CDE 8.11 fluid pack lot #[id number]  . CATARACT EXTRACTION W/PHACO Right 06/04/2015   Procedure: CATARACT EXTRACTION PHACO AND INTRAOCULAR LENS PLACEMENT (IOC);  Surgeon: Birder Robson, MD;  Location: [facility];  Service: Ophthalmology;  Laterality: Right;  US:00:48 AP%: 10.5 CDE:5.08 Fluid lot #[id number] H  . CYSTOSCOPY/URETEROSCOPY/HOLMIUM LASER/STENT PLACEMENT Bilateral 09/22/2016   Procedure: CYSTOSCOPY/URETEROSCOPY/HOLMIUM LASER/STENT PLACEMENT;  Surgeon: Hollice Espy, MD;  Location: [facility];  Service: Urology;  Laterality: Bilateral;  . EYE SURGERY  2015   tissue biopsy  . FOOT SURGERY  2015  . JOINT REPLACEMENT Left 2013   hip replacement  . LITHOTRIPSY    . PTOSIS REPAIR Bilateral 02/18/2016   Procedure: BILATERAL PTOSIS REPAIR UPPER EYELIDS;  Surgeon: Karle Starch, MD;  Location: [facility];  Service: Ophthalmology;  Laterality: Bilateral;  LEAVE PT EARLY AM  . thumb surgery Right   . TONSILLECTOMY  1973    Family History  Problem Relation Age of Onset  . Cancer Father        Abdomen with mastasis  . Cancer Mother   . Heart disease Mother   . Kidney disease Neg Hx   . Bladder Cancer Neg Hx   . Prostate cancer Neg Hx   . Kidney cancer Neg Hx     Allergies  Allergen Reactions  . Albuterol Shortness Of Breath and Other (See Comments)    Makes pt feel jittery/ tacycardic  . Crestor [Rosuvastatin] Other (See Comments)    Joint pain, muscle pain, and hair loss  . Halcion [Triazolam] Other (See Comments)    Dizziness,headaches,bladder problems  . Levaquin [Levofloxacin In D5w] Diarrhea and Itching    Shoulder pain  . Naproxen Sodium Swelling    Patient tolerates in small doses  . Tylenol [Acetaminophen] Swelling    Patient tolerates in small doses  . Cefaclor Other (See Comments)    Doesn't remember---unsure if actually allergic   . Diclofenac  Sodium Other (See Comments)    "made very sick"  . Sulfa Antibiotics Itching    Unsure of reaction possibly itching  . Tramadol Itching and Nausea And Vomiting  . Aripiprazole Other (See Comments)    Muscle tension/cramping  . Ibuprofen Swelling    Patient tolerates in small doses    Current Outpatient Medications on File Prior to Visit  Medication Sig Dispense Refill  . Lysine 1000 MG TABS Take 2,000-4,000 mg by  mouth at bedtime. 2000 mg scheduled at bedtime and patient will take 4000 mg if she has outbreak    . Magnesium 500 MG TABS Take 1 tablet by mouth at bedtime.    Marland Kitchen neomycin-bacitracin-polymyxin (NEOSPORIN) [phone] ointment Apply 1 application topically 4 (four) times daily as needed (for cut/scrapes.).    Marland Kitchen nicotine (NICODERM CQ - DOSED IN MG/24 HOURS) 21 mg/24hr patch Place 1 patch (21 mg total) onto the skin daily. 28 patch 3  . phenazopyridine (AZO-TABS) 95 MG tablet Take 95 mg by mouth 3 (three) times daily as needed for pain.    . polyethylene glycol powder (GLYCOLAX/MIRALAX) powder MIX 17 GRAMS (1 CAPFUL) WITH 4-8 OZ OF LIQUID AND TAKE BY MOUTH TWICE DAILY AS NEEDED 527 g 0  . temazepam (RESTORIL) 30 MG capsule TAKE ONE TO TWO CAPSULES BY MOUTH NIGHTLY AT BEDTIME 60 capsule 4  . tiZANidine (ZANAFLEX) 4 MG tablet TAKE 2 TABLETS (8 MG TOTAL) BY MOUTH EVERY 6 (SIX) HOURS AS NEEDED FOR MUSCLE SPASMS. 720 tablet 0  . traZODone (DESYREL) 50 MG tablet Take 2 tablets (100 mg total) by mouth at bedtime. 1  qhs  For 3 days if fails the 2  qhs 30 tablet 7  . zolpidem (AMBIEN) 10 MG tablet Take 1 tablet (10 mg total) by mouth at bedtime. 30 tablet 5  . ASPERCREME LIDOCAINE EX Apply 1 application topically 4 (four) times daily as needed (for pain.).     Marland Kitchen aspirin 325 MG tablet Take 325 mg by mouth every 4 (four) hours as needed for headache.     Marland Kitchen BIOTIN PO Take 100 mg by mouth daily.    . calcipotriene-betamethasone (TACLONEX) ointment Apply 1 application topically daily as needed  (for psorasis).     . Cholecalciferol (VITAMIN D3) 10000 units capsule Take 30,000 Units by mouth daily.     . clobetasol cream (TEMOVATE) 4.70 % Apply 1 application topically 2 (two) times daily. (Patient not taking: Reported on 08/26/2018) 30 g 2  . cyanocobalamin (,VITAMIN B-12,) 1000 MCG/ML injection Inject 1 mL (1,000 mcg total) into the muscle every 3 (three) months. (Patient not taking: Reported on 08/26/2018) 1 mL 0  . diclofenac sodium (VOLTAREN) 1 % GEL Apply 4 g topically 4 (four) times daily. (Patient not taking: Reported on 08/26/2018) 500 g 5  . docusate sodium (COLACE) 100 MG capsule Take 1 capsule (100 mg total) by mouth 2 (two) times daily. (Patient not taking: Reported on 08/26/2018) 60 capsule 0  . DULoxetine (CYMBALTA) 20 MG capsule TAKE 2 CAPSULES (40 MG TOTAL) BY MOUTH AT BEDTIME. (Patient not taking: Reported on 08/26/2018) 180 capsule 3  . HYDROcodone-acetaminophen (NORCO) 10-325 MG tablet Take 1 tablet by mouth every 6 (six) hours as needed for moderate pain or severe pain. Supervising physician; Dr Eliezer Lofts; DEA# [id number] (Patient not taking: Reported on 08/26/2018) 120 tablet 0  . hydroquinone 4 % cream 1 APPLICATION TOPICALLY DAILY AS NEEDED FOR BLEMISHES.  3  . hydrOXYzine (VISTARIL) 50 MG capsule 1   q  Noon  1  q 6:00 pm  1  prn (Patient not taking: Reported on 08/26/2018) 90 capsule 5  . ketoconazole (NIZORAL) 2 % cream Apply 1 application topically daily. (Patient not taking: Reported on 08/26/2018) 30 g 0  . lamoTRIgine (LAMICTAL) 150 MG tablet Take 2 tablets (300 mg total) by mouth at bedtime. (Patient not taking: Reported on 08/26/2018) 60 tablet 5   No current facility-administered medications on file prior to visit.  BP 100/70   Pulse 95   Temp 98.7 F (37.1 C) (Oral)   Ht 5\' 9"  (1.753 m)   Wt 185 lb 4 oz (84 kg)   LMP 08/23/2014   SpO2 98%   BMI 27.36 kg/m    Objective:   Physical Exam  Constitutional: She appears well-nourished.  Neck:  Neck supple.  Cardiovascular: Normal rate and regular rhythm.  Respiratory: Effort normal and breath sounds normal. She has no wheezes.  GI: Soft. There is no CVA tenderness.  Psychiatric: She has a normal mood and affect.           Assessment & Plan:  Urinary Frequency:  Also with dysuria. UA today with trace blood, negative leuks/nitrites. Culture sent. Discussed to push intake of water.  Await culture results.  [name], NP

## 2018-08-27 LAB — URINE CULTURE
MICRO NUMBER:: 91225722
Result:: NO GROWTH
SPECIMEN QUALITY:: ADEQUATE

## 2018-08-29 ENCOUNTER — Ambulatory Visit
Admission: RE | Admit: 2018-08-29 | Discharge: 2018-08-29 | Disposition: A | Discharge status: Home / Self Care | Payer: PPO | Source: Ambulatory Visit | Attending: Urology | Admitting: Urology

## 2018-08-29 ENCOUNTER — Ambulatory Visit: Payer: PPO | Admitting: Urology

## 2018-08-29 ENCOUNTER — Other Ambulatory Visit: Payer: Self-pay | Admitting: Primary Care

## 2018-08-29 ENCOUNTER — Other Ambulatory Visit: Payer: Self-pay | Admitting: Urology

## 2018-08-29 ENCOUNTER — Encounter: Payer: Self-pay | Admitting: Urology

## 2018-08-29 VITALS — BP 116/79 | HR 102 | Ht 69.0 in | Wt 185.4 lb

## 2018-08-29 DIAGNOSIS — R109 Unspecified abdominal pain: Secondary | ICD-10-CM | POA: Diagnosis not present

## 2018-08-29 DIAGNOSIS — N200 Calculus of kidney: Secondary | ICD-10-CM | POA: Diagnosis present

## 2018-08-29 DIAGNOSIS — Z87442 Personal history of urinary calculi: Secondary | ICD-10-CM

## 2018-08-29 DIAGNOSIS — R3129 Other microscopic hematuria: Secondary | ICD-10-CM | POA: Diagnosis not present

## 2018-08-29 DIAGNOSIS — R319 Hematuria, unspecified: Secondary | ICD-10-CM | POA: Diagnosis not present

## 2018-08-29 LAB — URINALYSIS, COMPLETE
BILIRUBIN UA: NEGATIVE
Glucose, UA: NEGATIVE
KETONES UA: NEGATIVE
LEUKOCYTES UA: NEGATIVE
Nitrite, UA: NEGATIVE
PROTEIN UA: NEGATIVE
SPEC GRAV UA: 1.02 (ref 1.005–1.030)
Urobilinogen, Ur: 0.2 mg/dL (ref 0.2–1.0)
pH, UA: 7 (ref 5.0–7.5)

## 2018-08-29 LAB — MICROSCOPIC EXAMINATION: WBC UA: NONE SEEN /HPF (ref 0–5)

## 2018-08-29 IMAGING — CR DG ABDOMEN 1V
1 series · 2 of 2 positions shown · non-contrast
Comparison: [DATE]

CLINICAL DATA: Right-sided flank pain and hematuria

EXAM:
ABDOMEN - 1 VIEW

[Series 1: dg abd 1 view · 0.14mm/px · 2 of 2 slices shown]
[im 1/2]
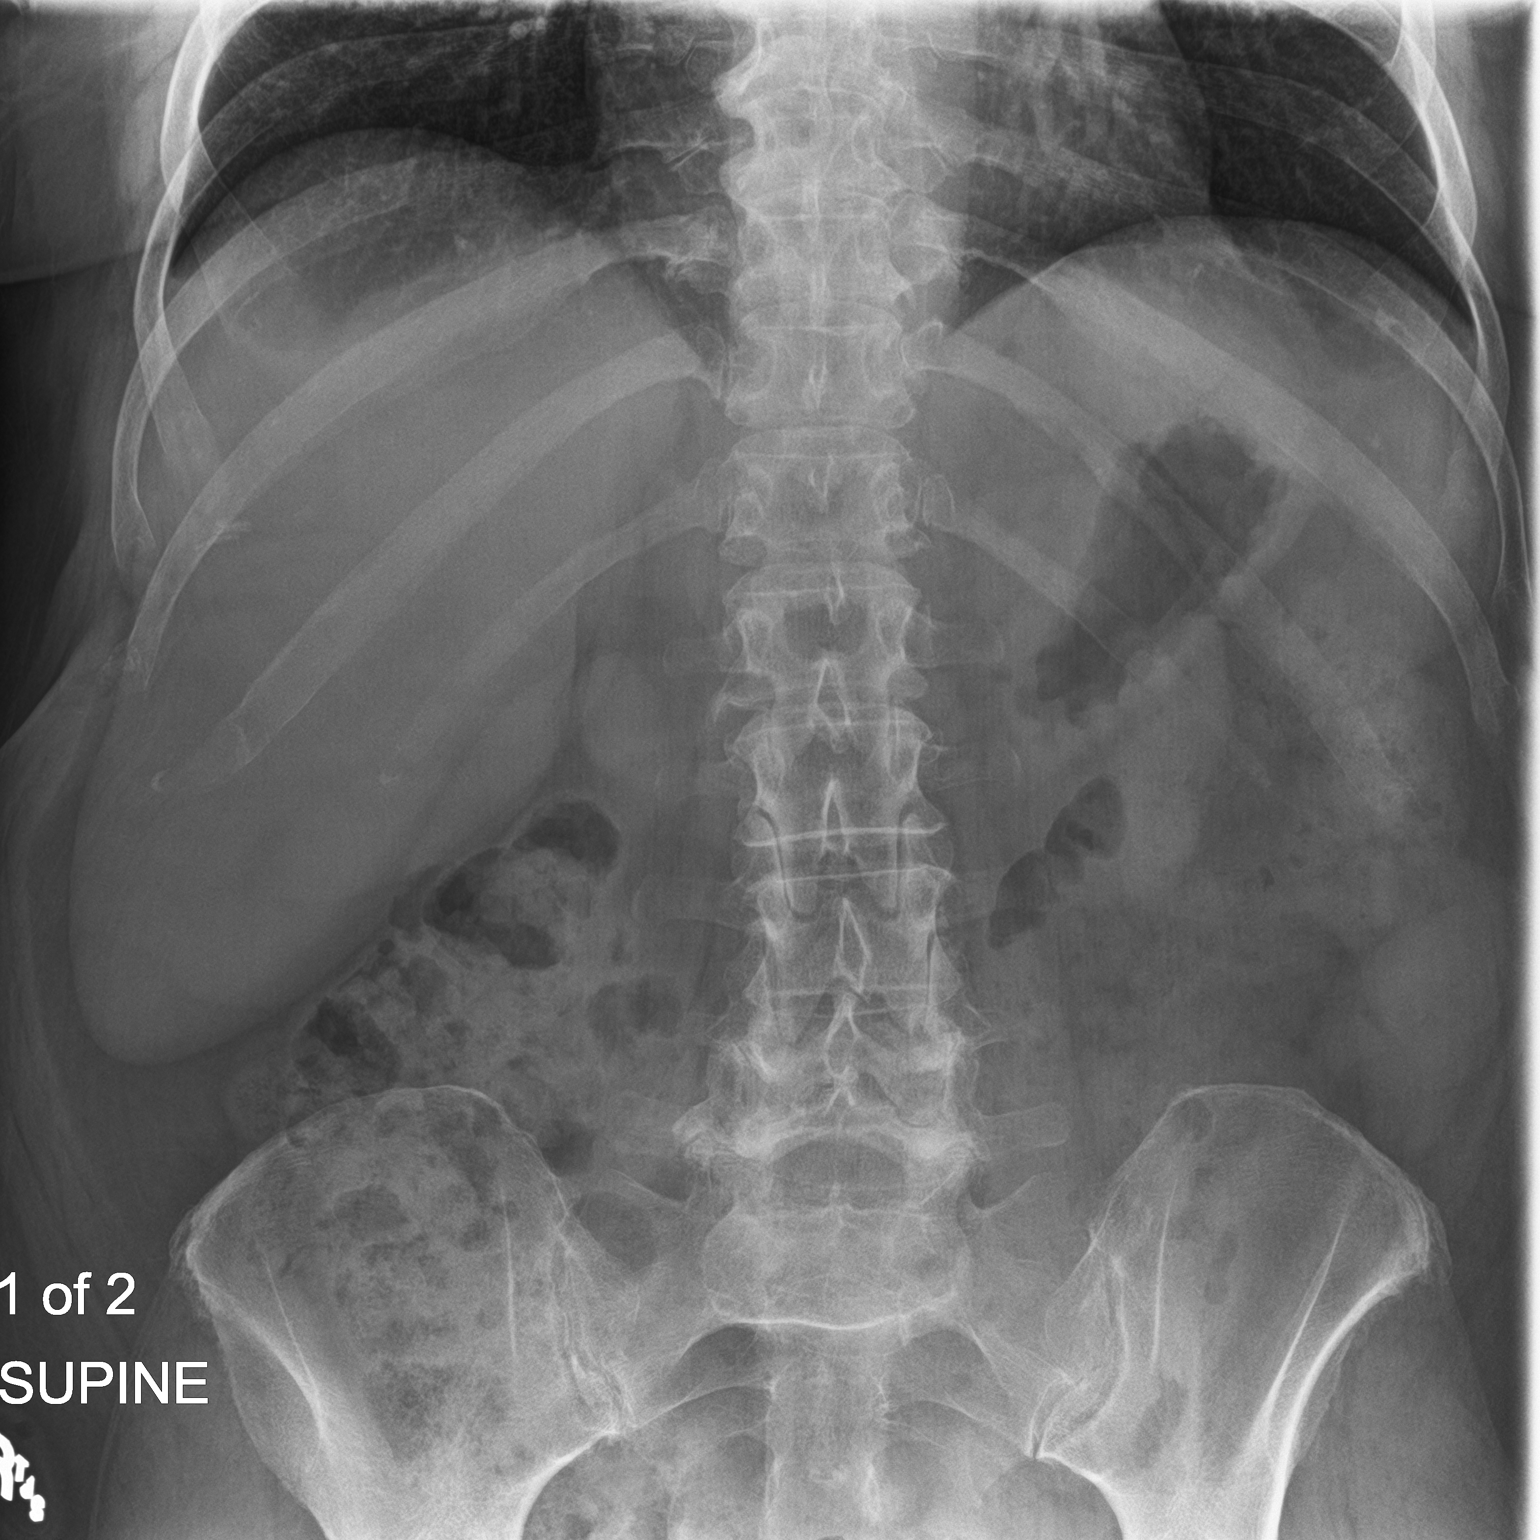
[im 2/2]
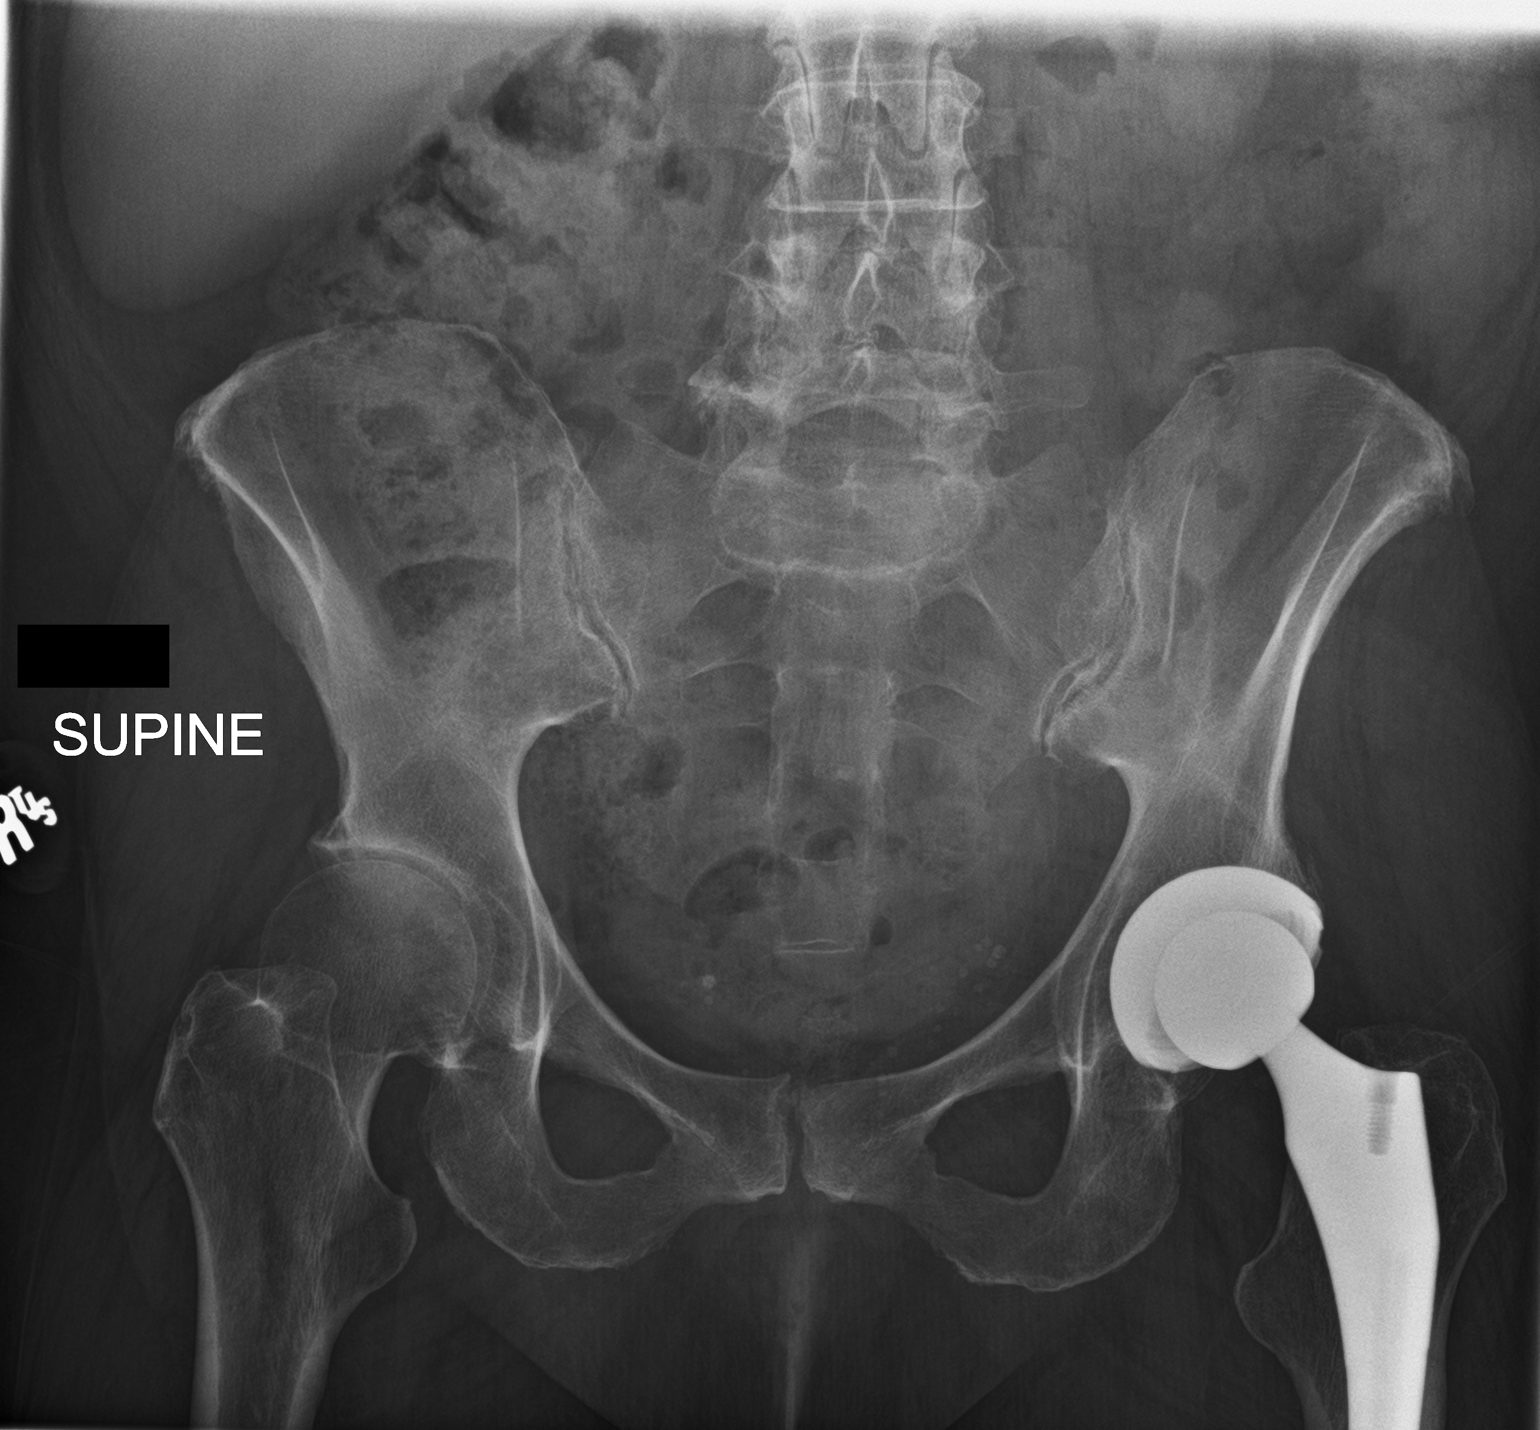

[2 of 2 positions shown; findings below may reference images not displayed]

FINDINGS: Scattered large and small bowel gas is noted. No free air is seen.
No abnormal mass or abnormal calcifications are noted. Degenerative
changes of lumbar spine are noted. Left hip replacement is seen.
IMPRESSION: No acute abnormality noted.

## 2018-08-29 MED ORDER — HYDROCODONE-ACETAMINOPHEN 10-325 MG PO TABS: 1.0000 | ORAL_TABLET | Freq: Four times a day (QID) | ORAL | 0 refills | Status: DC | PRN

## 2018-08-29 NOTE — Progress Notes (Signed)
08/29/2018 1:09 PM   [name] 21-Nov-1960 [id number]  Referring provider: [name], NP [place] [place], [place] [address]  Chief Complaint  Patient presents with  . Nephrolithiasis    HPI: Patient is a 57 year old Caucasian female with a history of nephrolithiasis and frequency who presents today with possible renal colic.  CT renal stone study in 08/2016 revealed 8 mm right renal pelvic stone without hydronephrosis. 1-2 mm proximal left ureteral stone with mild fullness of the left renal collecting system.  Right nephrolithiasis.    She underwent bilateral urs in 09/2016 with Dr. Erlene Quan.  She then returned to the hospital for fevers and pain.  Follow up CT Renal stone in 09/2016 revealed nonobstructing right renal stone.  No acute abnormality is identified.  Follow up RUS was negative for hydronephrosis.    She has also underwent PTNS for urinary frequency.    Today , she is experiencing frequency, urgency, intermittent dysuria, mild urge incontinence and intermittent hesitancy.  She is also experiencing mild right flank pain, mild left flank pain and mild suprapubic pain.    Patient denies any gross hematuria.  Patient denies any fevers, chills, nausea or vomiting.  Her UA is positive for 3-10 RBC's and many bacteria.    KUB today is negative.    She is a former smoker.   PMH: Past Medical History:  Diagnosis Date  . Arthritis    osteo  . Asthma   . Bipolar disorder ([place]) 05/21/14  . Cataracts, bilateral   . DDD (degenerative disc disease), cervical    also back  . Depression   . Dizziness    Positional  . Edema    feet/legs  . Fibromyalgia syndrome   . Fungal infection    Finger nails  . GERD (gastroesophageal reflux disease)   . Gout   . Headache    seasonal allergies  . Heart palpitations   . 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 Hypercholesterolemia   . Multiple sclerosis (HCC)    weakness  . Nephrolithiasis    kidney  stones  . Osteoporosis    osteoarthritis  . Pneumonia   . PONV (postoperative nausea and vomiting)    no problem after cataract surgery  . Psoriasis   . Renal stone   . Shortness of breath dyspnea    wheezing  . Sleep apnea 2012   sleep study / slight, no interventions  . Urinary frequency     Surgical History: Past Surgical History:  Procedure Laterality Date  . CATARACT EXTRACTION W/PHACO Left 05/21/2015   Procedure: CATARACT EXTRACTION PHACO AND INTRAOCULAR LENS PLACEMENT (IOC);  Surgeon: Birder Robson, MD;  Location: [facility];  Service: Ophthalmology;  Laterality: Left;  [place] 00:35 AP% 22.9 CDE 8.11 fluid pack lot #[id number] H  . CATARACT EXTRACTION W/PHACO Right 06/04/2015   Procedure: CATARACT EXTRACTION PHACO AND INTRAOCULAR LENS PLACEMENT (IOC);  Surgeon: Birder Robson, MD;  Location: [facility];  Service: Ophthalmology;  Laterality: Right;  US:00:48 AP%: 10.5 CDE:5.08 Fluid lot #[id number] H  . CYSTOSCOPY/URETEROSCOPY/HOLMIUM LASER/STENT PLACEMENT Bilateral 09/22/2016   Procedure: CYSTOSCOPY/URETEROSCOPY/HOLMIUM LASER/STENT PLACEMENT;  Surgeon: Hollice Espy, MD;  Location: [facility];  Service: Urology;  Laterality: Bilateral;  . EYE SURGERY  2015   tissue biopsy  . FOOT SURGERY  2015  . JOINT REPLACEMENT Left 2013   hip replacement  . LITHOTRIPSY    . PTOSIS REPAIR Bilateral 02/18/2016   Procedure: BILATERAL PTOSIS REPAIR UPPER EYELIDS;  Surgeon: Philis Pique  Vickki Muff, MD;  Location: [facility];  Service: Ophthalmology;  Laterality: Bilateral;  LEAVE PT EARLY AM  . thumb surgery Right   . TONSILLECTOMY  1973    Home Medications:  Allergies as of 08/29/2018      Reactions   Albuterol Shortness Of Breath, Other (See Comments)   Makes pt feel jittery/ tacycardic   Crestor [rosuvastatin] Other (See Comments)   Joint pain, muscle pain, and hair loss   Halcion [triazolam] Other (See Comments)   Dizziness,headaches,bladder problems   Levaquin [levofloxacin In D5w] Diarrhea,  Itching   Shoulder pain   Naproxen Sodium Swelling   Patient tolerates in small doses   Tylenol [acetaminophen] Swelling   Patient tolerates in small doses   Cefaclor Other (See Comments)   Doesn't remember---unsure if actually allergic    Diclofenac Sodium Other (See Comments)   "made very sick"   Sulfa Antibiotics Itching   Unsure of reaction possibly itching   Tramadol Itching, Nausea And Vomiting   Aripiprazole Other (See Comments)   Muscle tension/cramping   Ibuprofen Swelling   Patient tolerates in small doses      Medication List        Accurate as of 08/29/18  1:09 PM. Always use your most recent med list.          ASPERCREME LIDOCAINE EX Apply 1 application topically 4 (four) times daily as needed (for pain.).   aspirin 325 MG tablet Take 325 mg by mouth every 4 (four) hours as needed for headache.   AZO-TABS 95 MG tablet Generic drug:  phenazopyridine Take 95 mg by mouth 3 (three) times daily as needed for pain.   BIOTIN PO Take 100 mg by mouth daily.   calcipotriene-betamethasone ointment Commonly known as:  TACLONEX Apply 1 application topically daily as needed (for psorasis).   clobetasol cream 0.05 % Commonly known as:  TEMOVATE Apply 1 application topically 2 (two) times daily.   cyanocobalamin 1000 MCG/ML injection Commonly known as:  (VITAMIN B-12) Inject 1 mL (1,000 mcg total) into the muscle every 3 (three) months.   diclofenac sodium 1 % Gel Commonly known as:  VOLTAREN Apply 4 g topically 4 (four) times daily.   docusate sodium 100 MG capsule Commonly known as:  COLACE Take 1 capsule (100 mg total) by mouth 2 (two) times daily.   DULoxetine 20 MG capsule Commonly known as:  CYMBALTA TAKE 2 CAPSULES (40 MG TOTAL) BY MOUTH AT BEDTIME.   HYDROcodone-acetaminophen 10-325 MG tablet Commonly known as:  NORCO Take 1 tablet by mouth every 6 (six) hours as needed for moderate pain or severe pain. Supervising physician; Dr Eliezer Lofts; DEA#  [id number]   hydroquinone 4 % cream 1 APPLICATION TOPICALLY DAILY AS NEEDED FOR BLEMISHES.   hydrOXYzine 50 MG capsule Commonly known as:  VISTARIL 1   q  Noon  1  q 6:00 pm  1  prn   ketoconazole 2 % cream Commonly known as:  NIZORAL Apply 1 application topically daily.   lamoTRIgine 150 MG tablet Commonly known as:  LAMICTAL Take 2 tablets (300 mg total) by mouth at bedtime.   levalbuterol 45 MCG/ACT inhaler Commonly known as:  XOPENEX HFA Inhale 1-2 puffs into the lungs every 6 (six) hours as needed for wheezing.   Lysine 1000 MG Tabs Take 2,000-4,000 mg by mouth at bedtime. 2000 mg scheduled at bedtime and patient will take 4000 mg if she has outbreak   Magnesium 500 MG Tabs Take 1 tablet by  mouth at bedtime.   neomycin-bacitracin-polymyxin [phone] ointment Apply 1 application topically 4 (four) times daily as needed (for cut/scrapes.).   nicotine 21 mg/24hr patch Commonly known as:  NICODERM CQ - dosed in mg/24 hours Place 1 patch (21 mg total) onto the skin daily.   polyethylene glycol powder powder Commonly known as:  GLYCOLAX/MIRALAX MIX 17 GRAMS (1 CAPFUL) WITH 4-8 OZ OF LIQUID AND TAKE BY MOUTH TWICE DAILY AS NEEDED   temazepam 30 MG capsule Commonly known as:  RESTORIL TAKE ONE TO TWO CAPSULES BY MOUTH NIGHTLY AT BEDTIME   tiZANidine 4 MG tablet Commonly known as:  ZANAFLEX TAKE 2 TABLETS (8 MG TOTAL) BY MOUTH EVERY 6 (SIX) HOURS AS NEEDED FOR MUSCLE SPASMS.   traZODone 50 MG tablet Commonly known as:  DESYREL Take 2 tablets (100 mg total) by mouth at bedtime. 1  qhs  For 3 days if fails the 2  qhs   Vitamin D3 10000 units capsule Take 30,000 Units by mouth daily.   zolpidem 10 MG tablet Commonly known as:  AMBIEN Take 1 tablet (10 mg total) by mouth at bedtime.       Allergies:  Allergies  Allergen Reactions  . Albuterol Shortness Of Breath and Other (See Comments)    Makes pt feel jittery/ tacycardic  . Crestor [Rosuvastatin] Other (See  Comments)    Joint pain, muscle pain, and hair loss  . Halcion [Triazolam] Other (See Comments)    Dizziness,headaches,bladder problems  . Levaquin [Levofloxacin In D5w] Diarrhea and Itching    Shoulder pain  . Naproxen Sodium Swelling    Patient tolerates in small doses  . Tylenol [Acetaminophen] Swelling    Patient tolerates in small doses  . Cefaclor Other (See Comments)    Doesn't remember---unsure if actually allergic   . Diclofenac Sodium Other (See Comments)    "made very sick"  . Sulfa Antibiotics Itching    Unsure of reaction possibly itching  . Tramadol Itching and Nausea And Vomiting  . Aripiprazole Other (See Comments)    Muscle tension/cramping  . Ibuprofen Swelling    Patient tolerates in small doses    Family History: Family History  Problem Relation Age of Onset  . Cancer Father        Abdomen with mastasis  . Cancer Mother   . Heart disease Mother   . Kidney disease Neg Hx   . Bladder Cancer Neg Hx   . Prostate cancer Neg Hx   . Kidney cancer Neg Hx     Social History:  reports that she quit smoking about 5 years ago. Her smoking use included cigarettes. She quit after 25.00 years of use. She has never used smokeless tobacco. She reports that she does not drink alcohol or use drugs.  ROS: UROLOGY Frequent Urination?: Yes Hard to postpone urination?: Yes Burning/pain with urination?: Yes Get up at night to urinate?: No Leakage of urine?: Yes Urine stream starts and stops?: No Trouble starting stream?: Yes Do you have to strain to urinate?: No Blood in urine?: No Urinary tract infection?: No Sexually transmitted disease?: No Injury to kidneys or bladder?: No Painful intercourse?: No Weak stream?: No Currently pregnant?: No Vaginal bleeding?: No Last menstrual period?: n  Gastrointestinal Nausea?: No Vomiting?: No Indigestion/heartburn?: Yes Diarrhea?: No Constipation?: No  Constitutional Fever: No Night sweats?: No Weight loss?:  Yes Fatigue?: No  Skin Skin rash/lesions?: No Itching?: No  Eyes Blurred vision?: No Double vision?: No  Ears/Nose/Throat Sore throat?: No Sinus problems?:  No  Hematologic/Lymphatic Swollen glands?: No Easy bruising?: No  Cardiovascular Leg swelling?: No Chest pain?: No  Respiratory Cough?: No Shortness of breath?: No  Endocrine Excessive thirst?: No  Musculoskeletal Back pain?: Yes Joint pain?: Yes  Neurological Headaches?: No Dizziness?: No  Psychologic Depression?: Yes Anxiety?: Yes  Physical Exam: BP 116/79 (BP Location: Left Arm, Patient Position: Sitting, Cuff Size: Normal)   Pulse (!) 102   Ht 5\' 9"  (1.753 m)   Wt 185 lb 6.4 oz (84.1 kg)   LMP 08/23/2014   BMI 27.38 kg/m   Constitutional: Well nourished. Alert and oriented, No acute distress. HEENT: [place] AT, moist mucus membranes. Trachea midline, no masses. Cardiovascular: No clubbing, cyanosis, or edema. Respiratory: Normal respiratory effort, no increased work of breathing. Skin: No rashes, bruises or suspicious lesions. Lymph: No cervical or inguinal adenopathy. Neurologic: Grossly intact, no focal deficits, moving all 4 extremities. Psychiatric: Normal mood and affect.  Laboratory Data: Lab Results  Component Value Date   WBC 7.0 07/27/2017   HGB 13.6 07/27/2017   HCT 40.8 07/27/2017   MCV 96.4 07/27/2017   PLT 326.0 07/27/2017    Lab Results  Component Value Date   CREATININE 0.90 07/27/2017    No results found for: PSA  No results found for: TESTOSTERONE  Lab Results  Component Value Date   HGBA1C 5.9 07/22/2018    Lab Results  Component Value Date   TSH 1.14 03/18/2017       Component Value Date/Time   CHOL 305 (H) 11/24/2017 1105   HDL 43.00 11/24/2017 1105   CHOLHDL 7 11/24/2017 1105   VLDL 41.6 (H) 11/24/2017 1105   LDLCALC 223 (H) 11/13/2014 0908    Lab Results  Component Value Date   AST 11 11/24/2017   Lab Results  Component Value Date   ALT 11  11/24/2017   No components found for: ALKALINEPHOPHATASE No components found for: BILIRUBINTOTAL  No results found for: ESTRADIOL  Urinalysis 3-10 RBC's.  See Epic.   I have reviewed the labs.   Pertinent Imaging: CLINICAL DATA:  Right-sided flank pain and hematuria  EXAM: ABDOMEN - 1 VIEW  COMPARISON:  10/29/2016  FINDINGS: Scattered large and small bowel gas is noted. No free air is seen. No abnormal mass or abnormal calcifications are noted. Degenerative changes of lumbar spine are noted. Left hip replacement is seen.  IMPRESSION: No acute abnormality noted.   Electronically Signed   By: Inez Catalina M.D.   On: 08/29/2018 10:53 I have independently reviewed the films and no stones seen.   Assessment & Plan:    1. Microscopic hematuria I explained to the patient that there are a number of causes that can be associated with blood in the urine, such as stones, UTI's, damage to the urinary tract and/or cancer  At this time, I felt that the patient warranted further urologic evaluation with 3 or greater RBC's/hpf on microscopic evaluation of the urine.  The AUA guidelines state that a CT urogram is the preferred imaging study to evaluate hematuria. I explained to the patient that a contrast material will be injected into a vein and that in rare instances, an allergic reaction can result and may even life threatening   The patient denies any allergies to contrast, iodine and/or seafood and is not taking metformin. CTU is ordered UA positive for 3-10 RBC's and many bacteria Urine culture  2. History of nephrolithiasis CTU pending  Return for CT report .  These notes generated with  voice recognition software. I apologize for typographical errors.  Zara Council, PA-C  [facility] [address]  [place], [place] [address] [phone]

## 2018-08-29 NOTE — Progress Notes (Signed)
KUB is in.

## 2018-08-30 ENCOUNTER — Other Ambulatory Visit: Payer: Self-pay | Admitting: Urology

## 2018-08-30 ENCOUNTER — Other Ambulatory Visit: Payer: Self-pay | Admitting: Family Medicine

## 2018-08-30 DIAGNOSIS — R3129 Other microscopic hematuria: Secondary | ICD-10-CM

## 2018-08-30 MED ORDER — TAMSULOSIN HCL 0.4 MG PO CAPS: 0.4000 mg | ORAL_CAPSULE | Freq: Every day | ORAL | 11 refills | Status: DC

## 2018-08-30 NOTE — Progress Notes (Signed)
STAT CTU orders are in.

## 2018-08-31 ENCOUNTER — Encounter

## 2018-08-31 ENCOUNTER — Ambulatory Visit
Admission: RE | Admit: 2018-08-31 | Discharge: 2018-08-31 | Disposition: A | Discharge status: Home / Self Care | Payer: PPO | Source: Ambulatory Visit | Attending: Urology | Admitting: Urology

## 2018-08-31 ENCOUNTER — Ambulatory Visit (HOSPITAL_COMMUNITY): Payer: PPO | Admitting: Psychiatry

## 2018-08-31 ENCOUNTER — Encounter (HOSPITAL_COMMUNITY): Payer: Self-pay | Admitting: Psychiatry

## 2018-08-31 VITALS — BP 120/68 | Ht 69.0 in | Wt 186.0 lb

## 2018-08-31 DIAGNOSIS — N200 Calculus of kidney: Secondary | ICD-10-CM | POA: Diagnosis not present

## 2018-08-31 DIAGNOSIS — R3129 Other microscopic hematuria: Secondary | ICD-10-CM | POA: Diagnosis not present

## 2018-08-31 DIAGNOSIS — F3162 Bipolar disorder, current episode mixed, moderate: Secondary | ICD-10-CM

## 2018-08-31 IMAGING — CT CT ABD-PEL WO/W CM
3 of 12 series · 12 of 46 positions shown, 18 images · IV contrast (iopamidol)
Comparison: [DATE]

CLINICAL DATA: Bilateral flank pain and microhematuria. History of
lithotripsy and renal stones.

EXAM:
CT ABDOMEN AND PELVIS WITHOUT AND WITH CONTRAST
TECHNIQUE: Multidetector CT imaging of the abdomen and pelvis was performed
following the standard protocol before and following the bolus
administration of intravenous contrast.
CONTRAST:  125mL [3D] IOPAMIDOL ([3D]) INJECTION 61%

[Series 2: without pre (person_name) · axial · non-contrast · 0.83mm/px · z∈[-1462,-1332]mm · 3 of 94 slices shown]
[im 14/94  soft-tissue]
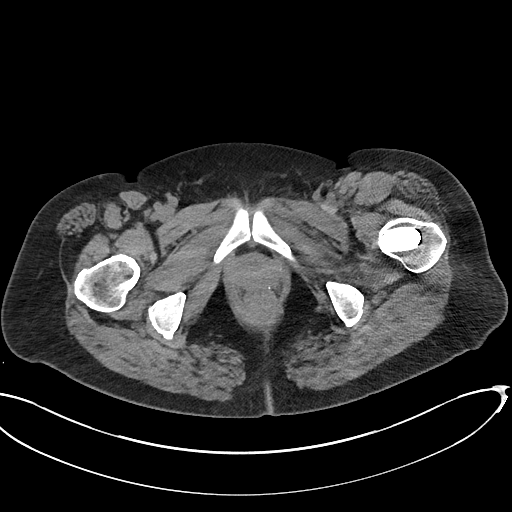
[im 27/94  soft-tissue]
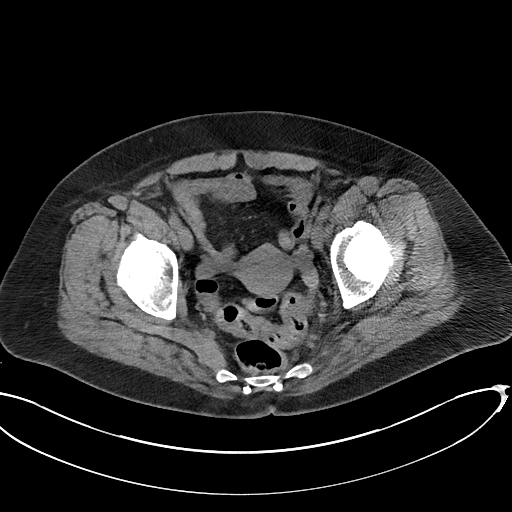
[im 40/94  soft-tissue]
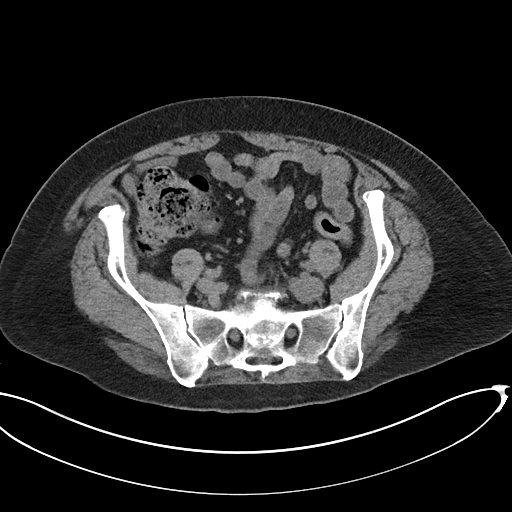

[Series 5: cor without without pre (person_name) · coronal · non-contrast · 0.85mm/px · 2 of 141 slices shown, 3 images]
[im 47/141  soft-tissue]
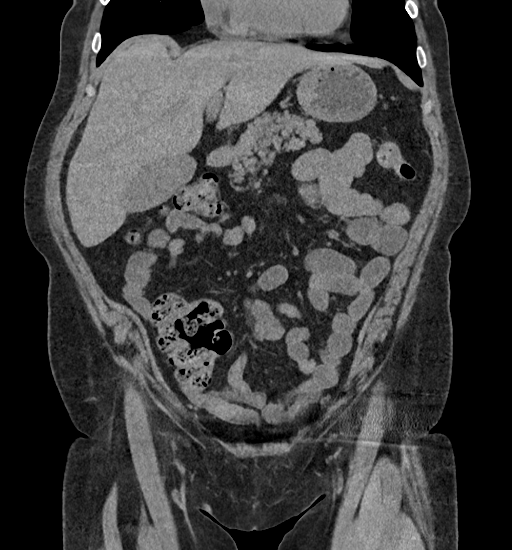
[im 47/141  bone]
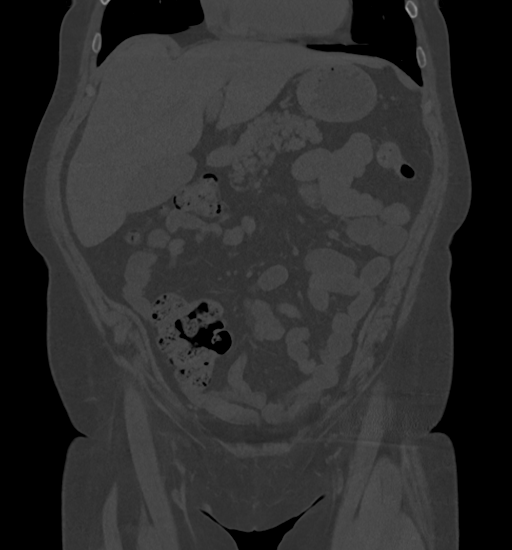
[im 94/141  soft-tissue]
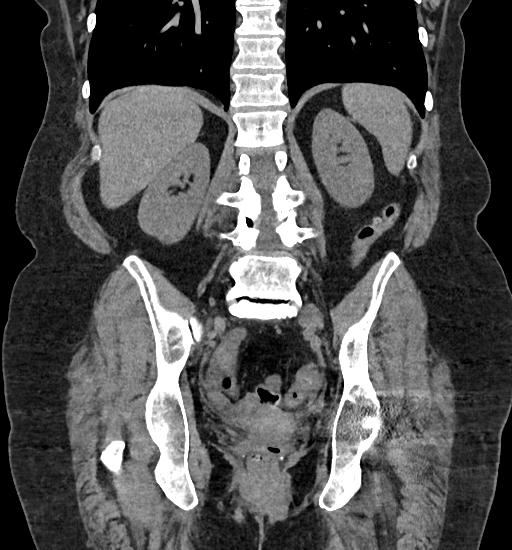

[Series 17: axial delay delay prone (person_name) · axial · delayed · 0.79mm/px · z∈[-1513,-1153]mm · 7 of 98 slices shown, 12 images]
[im 13/98  soft-tissue]
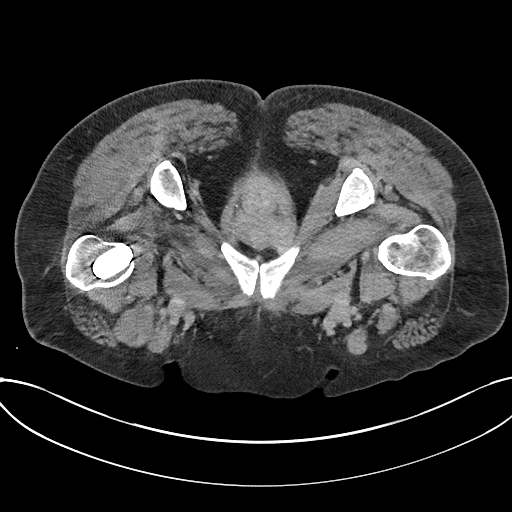
[im 13/98  bone]
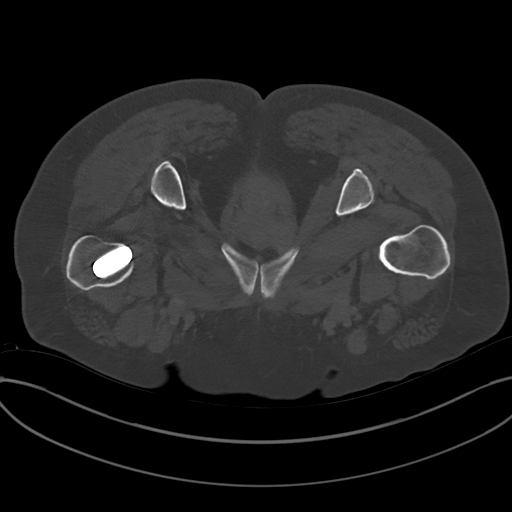
[im 25/98  soft-tissue]
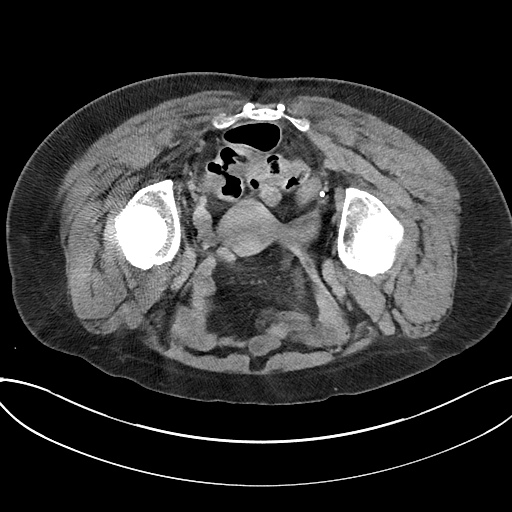
[im 37/98  soft-tissue]
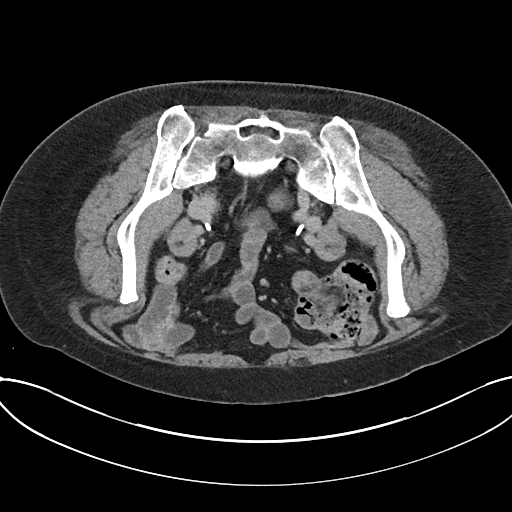
[im 49/98  soft-tissue]
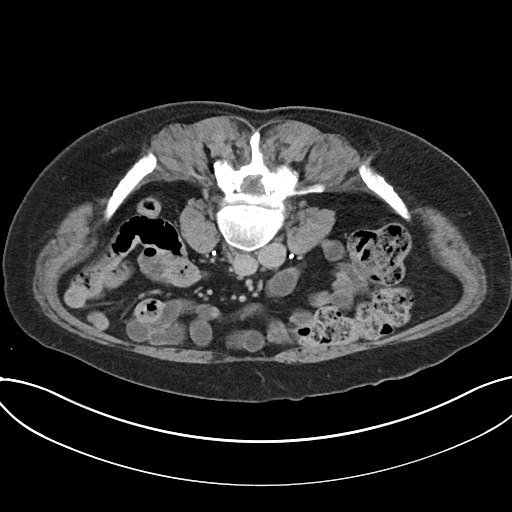
[im 49/98  lung]
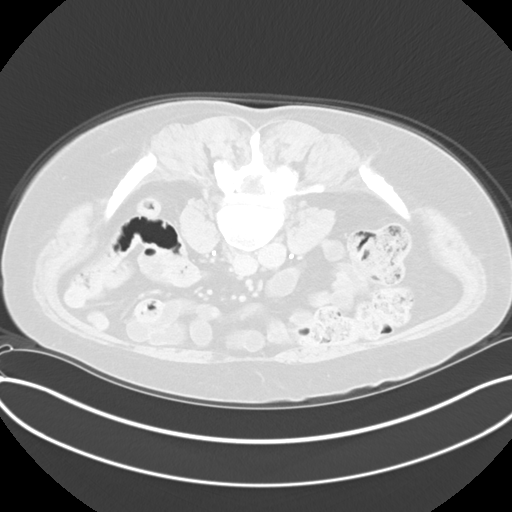
[im 61/98  soft-tissue]
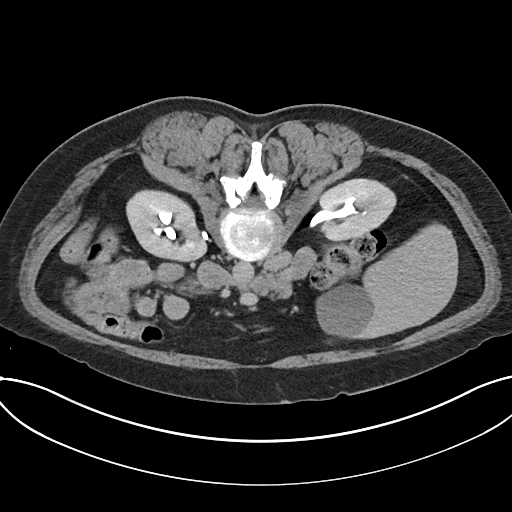
[im 61/98  lung]
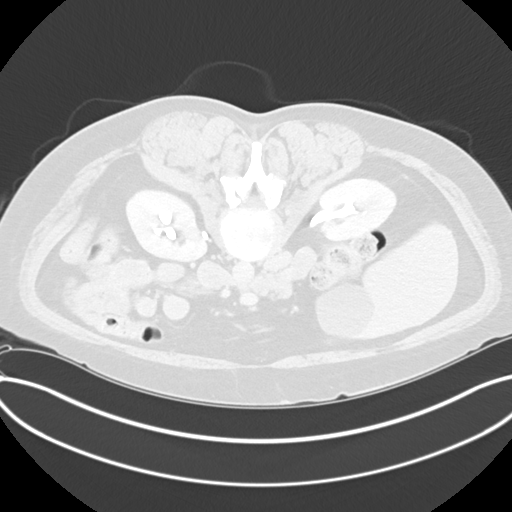
[im 73/98  soft-tissue]
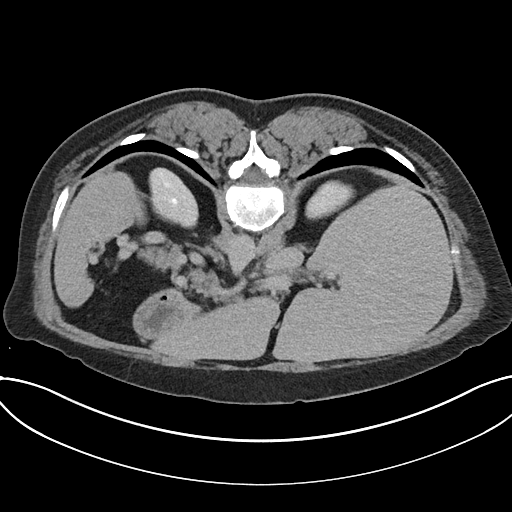
[im 73/98  lung]
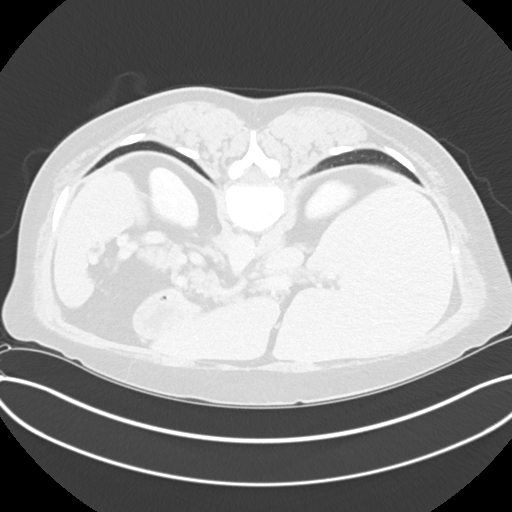
[im 85/98  soft-tissue]
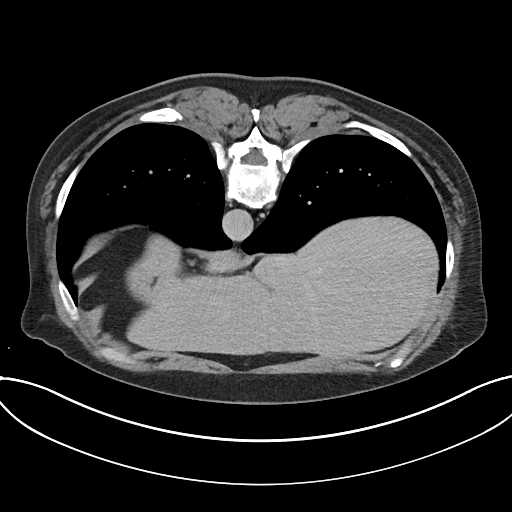
[im 85/98  lung]
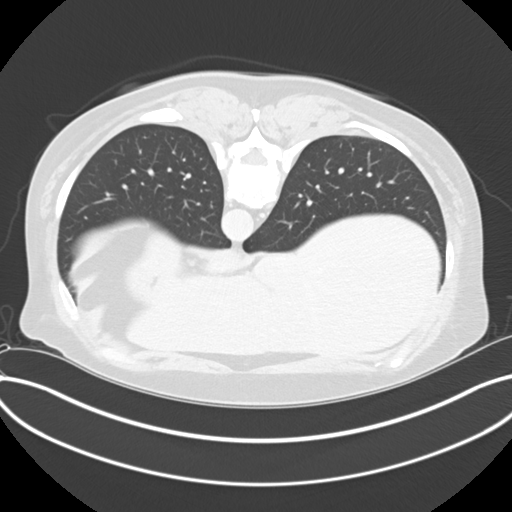

[12 of 46 positions shown; findings below may reference images not displayed]

FINDINGS: Lower chest: Unremarkable

Hepatobiliary: No focal abnormality within the liver parenchyma.
There is no evidence for gallstones, gallbladder wall thickening, or
pericholecystic fluid. No intrahepatic or extrahepatic biliary
dilation.

Pancreas: No focal mass lesion. No dilatation of the main duct. No
intraparenchymal cyst. No peripancreatic edema.

Spleen: No splenomegaly. No focal mass lesion.

Adrenals/Urinary Tract: No adrenal nodule or mass.

Precontrast imaging shows no stones in either kidney or ureter.
Bladder is decompressed without evidence for stones.

Imaging after IV contrast administration shows no enhancing lesion
in either kidney.

Delayed imaging shows no wall thickening or soft tissue filling
defect in either intrarenal collecting system or renal pelvis. Right
ureter is well opacified and normal in CT appearance. Distal third
of the left ureter is un opacified, but the left ureter is otherwise
normal in appearance. No focal bladder wall abnormality evident.

Stomach/Bowel: Stomach is nondistended. No gastric wall thickening.
No evidence of outlet obstruction. Duodenum is normally positioned
as is the ligament of Treitz. No small bowel wall thickening. No
small bowel dilatation. The terminal ileum is normal. The appendix
is normal. No gross colonic mass. No colonic wall thickening. No
substantial diverticular change.

Vascular/Lymphatic: No abdominal aortic aneurysm. There is no
gastrohepatic or hepatoduodenal ligament lymphadenopathy. No
intraperitoneal or retroperitoneal lymphadenopathy. No pelvic
sidewall lymphadenopathy.

Reproductive: Uterus unremarkable.  There is no adnexal mass.

Other: No intraperitoneal free fluid.

Musculoskeletal: Status post left hip replacement. No worrisome
lytic or sclerotic osseous abnormality.
IMPRESSION: 1. No CT findings to explain the patient's history of flank pain and
microhematuria. No acute findings in the abdomen or pelvis.

## 2018-08-31 MED ORDER — DULOXETINE HCL 20 MG PO CPEP: 40.0000 mg | ORAL_CAPSULE | Freq: Every day | ORAL | 3 refills | Status: DC

## 2018-08-31 MED ORDER — TRAZODONE HCL 50 MG PO TABS: 100.0000 mg | ORAL_TABLET | Freq: Every day | ORAL | 7 refills | Status: DC

## 2018-08-31 MED ORDER — TEMAZEPAM 30 MG PO CAPS: ORAL_CAPSULE | ORAL | 4 refills | Status: DC

## 2018-08-31 MED ORDER — LAMOTRIGINE 150 MG PO TABS: 300.0000 mg | ORAL_TABLET | Freq: Every day | ORAL | 5 refills | Status: DC

## 2018-08-31 MED ORDER — IOPAMIDOL (ISOVUE-300) INJECTION 61%
125.0000 mL | Freq: Once | INTRAVENOUS | Status: AC | PRN
  Administered 2018-08-31: 125 mL via INTRAVENOUS

## 2018-08-31 NOTE — Progress Notes (Signed)
Psychiatric Initial Adult Assessment   Patient Identification: Emily Moon MRN:  [id number] Date of Evaluation:  08/31/2018 Referral Source: From the community Chief Complaint:   Visit Diagno  Today the patient is at her baseline.  She has had episodes of being depressed but the last few weeks she has not been.  She had a lot of medical issues.  This includes her recent CAT scan to rule out kidney stones.  She is being followed by a urologist because she had blood in her urine.  The patient recently had a sleep study in her own home.  Patient denies being persistently depressed.  She is not at a time.  She recently has made a good friend.  She is no romance at this time.  The patient continues to vape regularly.  She denies anhedonia.  She is sleeping fairly well but noted is that she is lost a significant amount of weight.  Since I seen her she is lost 20 pounds.  She says it is because she is changed her diet and not because she is anorexic.  Her energy level is good.  She is outgoing she is positive.  She takes multiple psychiatric medications on a regular basis.  The patient's MS seems to be stable at this time.  Patient is making plans what to do for Christmas as it will be her first Christmas without her sister.  Unfortunately her psychotherapy did not work out.  She wants to find a therapist in her community it is not too expensive and that that she is going to try again.  The patient is not suicidal.  She denies significant cardiovascular complaints.  She has no neurological complaints as she is not really had any focal neurological problems from her MS.  Patient: Emily Moon  Procedure(s) Performed: * No surgery found *  Anesthes  Patient location:   Post pain:   Post assessment:   Last Vitals:  Vitals:   08/31/18 1535  BP: 120/68    Post vital signs:   Level of consciousness:   Complications: . Associated Signs/Symptoms: Depression Symptoms:   hopelessness, (Hypo) Manic Symptoms:   Anxiety Symptoms:   Psychotic Symptoms:   PTSD Symptoms:   Past Psychiatric History: The patient was seen in a psychiatric office and takes multiple medications she's not been recently hospitalized.  Previous Psychotropic Medications: Yes   Substance Abuse History in the last 12 months:  No.  Consequences of Substance Abuse: Negative  Past Medical History:  Past Medical History:  Diagnosis Date  . Arthritis    osteo  . Asthma   . Bipolar disorder ([place]) 05/21/14  . Cataracts, bilateral   . DDD (degenerative disc disease), cervical    also back  . Depression   . Dizziness    Positional  . Edema    feet/legs  . Fibromyalgia syndrome   . Fungal infection    Finger nails  . GERD (gastroesophageal reflux disease)   . Gout   . Headache    seasonal allergies  . Heart palpitations   . 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 Hypercholesterolemia   . Multiple sclerosis ([facility])    weakness  . Nephrolithiasis    kidney stones  . Osteoporosis    osteoarthritis  . Pneumonia   . PONV (postoperative nausea and vomiting)    no problem after cataract surgery  . Psoriasis   . Renal stone   . Shortness of breath dyspnea    wheezing  .  Sleep apnea 2012   sleep study / slight, no interventions  . Urinary frequency     Past Surgical History:  Procedure Laterality Date  . CATARACT EXTRACTION W/PHACO Left 05/21/2015   Procedure: CATARACT EXTRACTION PHACO AND INTRAOCULAR LENS PLACEMENT (IOC);  Surgeon: Birder Robson, MD;  Location: [facility];  Service: Ophthalmology;  Laterality: Left;  [place] 00:35 AP% 22.9 CDE 8.11 fluid pack lot #[id number]  . CATARACT EXTRACTION W/PHACO Right 06/04/2015   Procedure: CATARACT EXTRACTION PHACO AND INTRAOCULAR LENS PLACEMENT (IOC);  Surgeon: Birder Robson, MD;  Location: [facility];  Service: Ophthalmology;  Laterality: Right;  US:00:48 AP%: 10.5 CDE:5.08 Fluid lot #[id number]  . CYSTOSCOPY/URETEROSCOPY/HOLMIUM  LASER/STENT PLACEMENT Bilateral 09/22/2016   Procedure: CYSTOSCOPY/URETEROSCOPY/HOLMIUM LASER/STENT PLACEMENT;  Surgeon: Hollice Espy, MD;  Location: [facility];  Service: Urology;  Laterality: Bilateral;  . EYE SURGERY  2015   tissue biopsy  . FOOT SURGERY  2015  . JOINT REPLACEMENT Left 2013   hip replacement  . LITHOTRIPSY    . PTOSIS REPAIR Bilateral 02/18/2016   Procedure: BILATERAL PTOSIS REPAIR UPPER EYELIDS;  Surgeon: Karle Starch, MD;  Location: [facility];  Service: Ophthalmology;  Laterality: Bilateral;  LEAVE PT EARLY AM  . thumb surgery Right   . TONSILLECTOMY  1973    Family Psychiatric History:   Family History:  Family History  Problem Relation Age of Onset  . Cancer Father        Abdomen with mastasis  . Cancer Mother   . Heart disease Mother   . Kidney disease Neg Hx   . Bladder Cancer Neg Hx   . Prostate cancer Neg Hx   . Kidney cancer Neg Hx     Social History:   Social History   Socioeconomic History  . Marital status: Divorced    Spouse name: Not on file  . Number of children: 1  . Years of education: Not on file  . Highest education level: Not on file  Occupational History  . Occupation: Therapist, art Rep at [employer]  . Financial resource strain: Not on file  . Food insecurity:    Worry: Not on file    Inability: Not on file  . Transportation needs:    Medical: Not on file    Non-medical: Not on file  Tobacco Use  . Smoking status: Former Smoker    Years: 25.00    Types: Cigarettes    Last attempt to quit: 11/16/2012    Years since quitting: 5.7  . Smokeless tobacco: Never Used  . Tobacco comment: occasional use  Substance and Sexual Activity  . Alcohol use: No    Alcohol/week: 0.0 standard drinks  . Drug use: No    Types: Methylphenidate  . Sexual activity: Never  Lifestyle  . Physical activity:    Days per week: Not on file    Minutes per session: Not on file  . Stress: Not on file   Relationships  . Social connections:    Talks on phone: Not on file    Gets together: Not on file    Attends religious service: Not on file    Active member of club or organization: Not on file    Attends meetings of clubs or organizations: Not on file    Relationship status: Not on file  Other Topics Concern  . Not on file  Social History Narrative  . Not on file    Additional Social History:  Allergies:   Allergies  Allergen Reactions  . Albuterol Shortness Of Breath and Other (See Comments)    Makes pt feel jittery/ tacycardic  . Crestor [Rosuvastatin] Other (See Comments)    Joint pain, muscle pain, and hair loss  . Halcion [Triazolam] Other (See Comments)    Dizziness,headaches,bladder problems  . Levaquin [Levofloxacin In D5w] Diarrhea and Itching    Shoulder pain  . Naproxen Sodium Swelling    Patient tolerates in small doses  . Tylenol [Acetaminophen] Swelling    Patient tolerates in small doses  . Cefaclor Other (See Comments)    Doesn't remember---unsure if actually allergic   . Diclofenac Sodium Other (See Comments)    "made very sick"  . Sulfa Antibiotics Itching    Unsure of reaction possibly itching  . Tramadol Itching and Nausea And Vomiting  . Aripiprazole Other (See Comments)    Muscle tension/cramping  . Ibuprofen Swelling    Patient tolerates in small doses    Metabolic Disorder Labs: Lab Results  Component Value Date   HGBA1C 5.9 07/22/2018   No results found for: PROLACTIN Lab Results  Component Value Date   CHOL 305 (H) 11/24/2017   TRIG 208.0 (H) 11/24/2017   HDL 43.00 11/24/2017   CHOLHDL 7 11/24/2017   VLDL 41.6 (H) 11/24/2017   LDLCALC 223 (H) 11/13/2014     Current Medications: Current Outpatient Medications  Medication Sig Dispense Refill  . ASPERCREME LIDOCAINE EX Apply 1 application topically 4 (four) times daily as needed (for pain.).     Marland Kitchen aspirin 325 MG tablet Take 325 mg by mouth every 4 (four) hours as needed for  headache.     Marland Kitchen BIOTIN PO Take 100 mg by mouth daily.    . calcipotriene-betamethasone (TACLONEX) ointment Apply 1 application topically daily as needed (for psorasis).     . Cholecalciferol (VITAMIN D3) 10000 units capsule Take 30,000 Units by mouth daily.     . clobetasol cream (TEMOVATE) 3.41 % Apply 1 application topically 2 (two) times daily. (Patient not taking: Reported on 08/26/2018) 30 g 2  . cyanocobalamin (,VITAMIN B-12,) 1000 MCG/ML injection Inject 1 mL (1,000 mcg total) into the muscle every 3 (three) months. (Patient not taking: Reported on 08/26/2018) 1 mL 0  . diclofenac sodium (VOLTAREN) 1 % GEL Apply 4 g topically 4 (four) times daily. (Patient not taking: Reported on 08/26/2018) 500 g 5  . docusate sodium (COLACE) 100 MG capsule Take 1 capsule (100 mg total) by mouth 2 (two) times daily. (Patient not taking: Reported on 08/26/2018) 60 capsule 0  . DULoxetine (CYMBALTA) 20 MG capsule Take 2 capsules (40 mg total) by mouth at bedtime. 180 capsule 3  . HYDROcodone-acetaminophen (NORCO) 10-325 MG tablet Take 1 tablet by mouth every 6 (six) hours as needed for moderate pain or severe pain. Supervising physician; Dr Eliezer Lofts; DEA# [id number] 120 tablet 0  . hydroquinone 4 % cream 1 APPLICATION TOPICALLY DAILY AS NEEDED FOR BLEMISHES.  3  . hydrOXYzine (VISTARIL) 50 MG capsule 1   q  Noon  1  q 6:00 pm  1  prn (Patient not taking: Reported on 08/26/2018) 90 capsule 5  . ketoconazole (NIZORAL) 2 % cream Apply 1 application topically daily. (Patient not taking: Reported on 08/26/2018) 30 g 0  . lamoTRIgine (LAMICTAL) 150 MG tablet Take 2 tablets (300 mg total) by mouth at bedtime. 60 tablet 5  . levalbuterol (XOPENEX HFA) 45 MCG/ACT inhaler Inhale 1-2 puffs into the lungs every 6 (  six) hours as needed for wheezing. 1 Inhaler 0  . Lysine 1000 MG TABS Take 2,000-4,000 mg by mouth at bedtime. 2000 mg scheduled at bedtime and patient will take 4000 mg if she has outbreak    . Magnesium 500  MG TABS Take 1 tablet by mouth at bedtime.    Marland Kitchen neomycin-bacitracin-polymyxin (NEOSPORIN) [phone] ointment Apply 1 application topically 4 (four) times daily as needed (for cut/scrapes.).    Marland Kitchen nicotine (NICODERM CQ - DOSED IN MG/24 HOURS) 21 mg/24hr patch Place 1 patch (21 mg total) onto the skin daily. 28 patch 3  . phenazopyridine (AZO-TABS) 95 MG tablet Take 95 mg by mouth 3 (three) times daily as needed for pain.    . polyethylene glycol powder (GLYCOLAX/MIRALAX) powder MIX 17 GRAMS (1 CAPFUL) WITH 4-8 OZ OF LIQUID AND TAKE BY MOUTH TWICE DAILY AS NEEDED 527 g 0  . tamsulosin (FLOMAX) 0.4 MG CAPS capsule Take 1 capsule (0.4 mg total) by mouth daily. 30 capsule 11  . temazepam (RESTORIL) 30 MG capsule TAKE ONE TO TWO CAPSULES BY MOUTH NIGHTLY AT BEDTIME 60 capsule 4  . tiZANidine (ZANAFLEX) 4 MG tablet TAKE 2 TABLETS (8 MG TOTAL) BY MOUTH EVERY 6 (SIX) HOURS AS NEEDED FOR MUSCLE SPASMS. 720 tablet 0  . traZODone (DESYREL) 50 MG tablet Take 2 tablets (100 mg total) by mouth at bedtime. 1  qhs  For 3 days if fails the 2  qhs 30 tablet 7  . zolpidem (AMBIEN) 10 MG tablet Take 1 tablet (10 mg total) by mouth at bedtime. 30 tablet 5   No current facility-administered medications for this visit.     Neurologic: Headache: No Seizure: No Paresthesias:No  Musculoskeletal: Strength & Muscle Tone: within normal limits Gait & Station: normal Patient leans: N/A  Psychiatric Specialty Exam: ROS  Blood pressure 120/68, height 5\' 9"  (1.753 m), weight 186 lb (84.4 kg), last menstrual period 08/23/2014.Body mass index is 27.47 kg/m.  General Appearance: Casual  Eye Contact:  Good  Speech:  Clear and Coherent  Volume:  Normal  Mood:  Negative  Affect:  Appropriate  Thought Process:  Goal Directed  Orientation:  NA  Thought Content:  Logical  Suicidal Thoughts:  No  Homicidal Thoughts:  No  Memory:  Negative  Judgement:  Good  Insight:  Good  Psychomotor Activity:  Normal  Concentration:     Recall:    Fund of Knowledge:Good  Language: Good  Akathisia:  No  Handed:  Right  AIMS (if indicated):    Assets:  Desire for Improvement  ADL's:  Intact  Cognition: WNL  Sleep:      10/16/20194:12 PM  At this time the major problem in this patient is that she has a bipolar disorder.  She takes high-dose Lamictal 300 mg a day and has been taking this dose for years.  Her first problem therefore is bipolar disorder and she has some chronic personality features as well.  The patient has a significant problem with insomnia.  She takes Ambien 10 mg, Restoril 60 mg and trazodone for sleep.  With these medicines the patient is sleeping fairly well.  The patient is more social.  She is more engaging.  She now has one good friend.  She is proactive.  She is making plans for the holidays instead of waiting to get depressed.  The patient is not suicidal.  She is functioning very well.  Overall her symptomatology seems to be mild to moderate in nature.  I  believe she is to some degree improved compared to her last visit.  This patient she will return to see me in 4 months for a 30-minute brief psychotherapy session.

## 2018-09-01 LAB — CULTURE, URINE COMPREHENSIVE

## 2018-09-07 NOTE — Telephone Encounter (Signed)
Patient called and app made   [facility]

## 2018-09-09 DIAGNOSIS — G4733 Obstructive sleep apnea (adult) (pediatric): Secondary | ICD-10-CM | POA: Diagnosis not present

## 2018-09-12 ENCOUNTER — Telehealth: Payer: Self-pay | Admitting: *Deleted

## 2018-09-12 ENCOUNTER — Ambulatory Visit: Payer: PPO | Admitting: Urology

## 2018-09-12 ENCOUNTER — Encounter: Payer: Self-pay | Admitting: Urology

## 2018-09-12 VITALS — BP 104/69 | HR 91 | Ht 69.0 in | Wt 182.2 lb

## 2018-09-12 DIAGNOSIS — R3129 Other microscopic hematuria: Secondary | ICD-10-CM

## 2018-09-12 DIAGNOSIS — G4733 Obstructive sleep apnea (adult) (pediatric): Secondary | ICD-10-CM

## 2018-09-12 LAB — URINALYSIS, COMPLETE
Bilirubin, UA: NEGATIVE
GLUCOSE, UA: NEGATIVE
Nitrite, UA: NEGATIVE
Urobilinogen, Ur: 0.2 mg/dL (ref 0.2–1.0)
pH, UA: 5.5 (ref 5.0–7.5)

## 2018-09-12 LAB — MICROSCOPIC EXAMINATION

## 2018-09-12 NOTE — Progress Notes (Signed)
09/12/2018 9:13 AM   Emily Moon 06-05-1961 [id number]  Referring provider: [name], NP [place], [place] [address]  Chief Complaint  Patient presents with  . Cysto    HPI: Patient saw Larene Beach in October 2019.  She has a stone history and she has had percutaneous tibial nerve stimulation.  She had microscopic hematuria on her urinalysis.  Recent CT scan was normal  Today Frequency stable.  Clinically not infected  Cystoscopy: Patient underwent flexible cystoscopy utilizing sterile technique.  Bladder mucosa and trigone were normal.  There is no foreign body.  There is no cystitis.  There is no carcinoma.  Ureteral orifices were normal.  Urethra was clear  On pelvic examination she had mild hypermobility the bladder neck and no stress incontinence  PMH: Past Medical History:  Diagnosis Date  . Arthritis    osteo  . Asthma   . Bipolar disorder ([place]) 05/21/14  . Cataracts, bilateral   . DDD (degenerative disc disease), cervical    also back  . Depression   . Dizziness    Positional  . Edema    feet/legs  . Fibromyalgia syndrome   . Fungal infection    Finger nails  . GERD (gastroesophageal reflux disease)   . Gout   . Headache    seasonal allergies  . Heart palpitations   . 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 Hypercholesterolemia   . Multiple sclerosis (HCC)    weakness  . Nephrolithiasis    kidney stones  . Osteoporosis    osteoarthritis  . Pneumonia   . PONV (postoperative nausea and vomiting)    no problem after cataract surgery  . Psoriasis   . Renal stone   . Shortness of breath dyspnea    wheezing  . Sleep apnea 2012   sleep study / slight, no interventions  . Urinary frequency     Surgical History: Past Surgical History:  Procedure Laterality Date  . CATARACT EXTRACTION W/PHACO Left 05/21/2015   Procedure: CATARACT EXTRACTION PHACO AND INTRAOCULAR LENS PLACEMENT (IOC);  Surgeon: Birder Robson, MD;   Location: [facility];  Service: Ophthalmology;  Laterality: Left;  [place] 00:35 AP% 22.9 CDE 8.11 fluid pack lot #[id number]  . CATARACT EXTRACTION W/PHACO Right 06/04/2015   Procedure: CATARACT EXTRACTION PHACO AND INTRAOCULAR LENS PLACEMENT (IOC);  Surgeon: Birder Robson, MD;  Location: [facility];  Service: Ophthalmology;  Laterality: Right;  US:00:48 AP%: 10.5 CDE:5.08 Fluid lot #[id number] H  . CYSTOSCOPY/URETEROSCOPY/HOLMIUM LASER/STENT PLACEMENT Bilateral 09/22/2016   Procedure: CYSTOSCOPY/URETEROSCOPY/HOLMIUM LASER/STENT PLACEMENT;  Surgeon: Hollice Espy, MD;  Location: [facility];  Service: Urology;  Laterality: Bilateral;  . EYE SURGERY  2015   tissue biopsy  . FOOT SURGERY  2015  . JOINT REPLACEMENT Left 2013   hip replacement  . LITHOTRIPSY    . PTOSIS REPAIR Bilateral 02/18/2016   Procedure: BILATERAL PTOSIS REPAIR UPPER EYELIDS;  Surgeon: Karle Starch, MD;  Location: [facility];  Service: Ophthalmology;  Laterality: Bilateral;  LEAVE PT EARLY AM  . thumb surgery Right   . TONSILLECTOMY  1973    Home Medications:  Allergies as of 09/12/2018      Reactions   Albuterol Shortness Of Breath, Other (See Comments)   Makes pt feel jittery/ tacycardic   Crestor [rosuvastatin] Other (See Comments)   Joint pain, muscle pain, and hair loss   Halcion [triazolam] Other (See Comments)   Dizziness,headaches,bladder problems   Levaquin [levofloxacin In D5w] Diarrhea, Itching   Shoulder  pain   Naproxen Sodium Swelling   Patient tolerates in small doses   Tylenol [acetaminophen] Swelling   Patient tolerates in small doses   Cefaclor Other (See Comments)   Doesn't remember---unsure if actually allergic    Diclofenac Sodium Other (See Comments)   "made very sick"   Sulfa Antibiotics Itching   Unsure of reaction possibly itching   Tramadol Itching, Nausea And Vomiting   Aripiprazole Other (See Comments)   Muscle tension/cramping   Ibuprofen Swelling   Patient tolerates in small  doses      Medication List        Accurate as of 09/12/18  9:13 AM. Always use your most recent med list.          ASPERCREME LIDOCAINE EX Apply 1 application topically 4 (four) times daily as needed (for pain.).   aspirin 325 MG tablet Take 325 mg by mouth every 4 (four) hours as needed for headache.   AZO-TABS 95 MG tablet Generic drug:  phenazopyridine Take 95 mg by mouth 3 (three) times daily as needed for pain.   BIOTIN PO Take 100 mg by mouth daily.   calcipotriene-betamethasone ointment Commonly known as:  TACLONEX Apply 1 application topically daily as needed (for psorasis).   clobetasol cream 0.05 % Commonly known as:  TEMOVATE Apply 1 application topically 2 (two) times daily.   cyanocobalamin 1000 MCG/ML injection Commonly known as:  (VITAMIN B-12) Inject 1 mL (1,000 mcg total) into the muscle every 3 (three) months.   diclofenac sodium 1 % Gel Commonly known as:  VOLTAREN Apply 4 g topically 4 (four) times daily.   docusate sodium 100 MG capsule Commonly known as:  COLACE Take 1 capsule (100 mg total) by mouth 2 (two) times daily.   DULoxetine 20 MG capsule Commonly known as:  CYMBALTA Take 2 capsules (40 mg total) by mouth at bedtime.   HYDROcodone-acetaminophen 10-325 MG tablet Commonly known as:  NORCO Take 1 tablet by mouth every 6 (six) hours as needed for moderate pain or severe pain. Supervising physician; Dr Eliezer Lofts; DEA# [id number]   hydroquinone 4 % cream 1 APPLICATION TOPICALLY DAILY AS NEEDED FOR BLEMISHES.   hydrOXYzine 50 MG capsule Commonly known as:  VISTARIL 1   q  Noon  1  q 6:00 pm  1  prn   ketoconazole 2 % cream Commonly known as:  NIZORAL Apply 1 application topically daily.   lamoTRIgine 150 MG tablet Commonly known as:  LAMICTAL Take 2 tablets (300 mg total) by mouth at bedtime.   levalbuterol 45 MCG/ACT inhaler Commonly known as:  XOPENEX HFA Inhale 1-2 puffs into the lungs every 6 (six) hours as needed for  wheezing.   Lysine 1000 MG Tabs Take 2,000-4,000 mg by mouth at bedtime. 2000 mg scheduled at bedtime and patient will take 4000 mg if she has outbreak   Magnesium 500 MG Tabs Take 1 tablet by mouth at bedtime.   neomycin-bacitracin-polymyxin [phone] ointment Apply 1 application topically 4 (four) times daily as needed (for cut/scrapes.).   nicotine 21 mg/24hr patch Commonly known as:  NICODERM CQ - dosed in mg/24 hours Place 1 patch (21 mg total) onto the skin daily.   polyethylene glycol powder powder Commonly known as:  GLYCOLAX/MIRALAX MIX 17 GRAMS (1 CAPFUL) WITH 4-8 OZ OF LIQUID AND TAKE BY MOUTH TWICE DAILY AS NEEDED   tamsulosin 0.4 MG Caps capsule Commonly known as:  FLOMAX Take 1 capsule (0.4 mg total) by mouth daily.  temazepam 30 MG capsule Commonly known as:  RESTORIL TAKE ONE TO TWO CAPSULES BY MOUTH NIGHTLY AT BEDTIME   tiZANidine 4 MG tablet Commonly known as:  ZANAFLEX TAKE 2 TABLETS (8 MG TOTAL) BY MOUTH EVERY 6 (SIX) HOURS AS NEEDED FOR MUSCLE SPASMS.   traZODone 50 MG tablet Commonly known as:  DESYREL Take 2 tablets (100 mg total) by mouth at bedtime. 1  qhs  For 3 days if fails the 2  qhs   Vitamin D3 10000 units capsule Take 30,000 Units by mouth daily.   zolpidem 10 MG tablet Commonly known as:  AMBIEN Take 1 tablet (10 mg total) by mouth at bedtime.       Allergies:  Allergies  Allergen Reactions  . Albuterol Shortness Of Breath and Other (See Comments)    Makes pt feel jittery/ tacycardic  . Crestor [Rosuvastatin] Other (See Comments)    Joint pain, muscle pain, and hair loss  . Halcion [Triazolam] Other (See Comments)    Dizziness,headaches,bladder problems  . Levaquin [Levofloxacin In D5w] Diarrhea and Itching    Shoulder pain  . Naproxen Sodium Swelling    Patient tolerates in small doses  . Tylenol [Acetaminophen] Swelling    Patient tolerates in small doses  . Cefaclor Other (See Comments)    Doesn't remember---unsure if  actually allergic   . Diclofenac Sodium Other (See Comments)    "made very sick"  . Sulfa Antibiotics Itching    Unsure of reaction possibly itching  . Tramadol Itching and Nausea And Vomiting  . Aripiprazole Other (See Comments)    Muscle tension/cramping  . Ibuprofen Swelling    Patient tolerates in small doses    Family History: Family History  Problem Relation Age of Onset  . Cancer Father        Abdomen with mastasis  . Cancer Mother   . Heart disease Mother   . Kidney disease Neg Hx   . Bladder Cancer Neg Hx   . Prostate cancer Neg Hx   . Kidney cancer Neg Hx     Social History:  reports that she quit smoking about 5 years ago. Her smoking use included cigarettes. She quit after 25.00 years of use. She has never used smokeless tobacco. She reports that she does not drink alcohol or use drugs.  ROS:                                        Physical Exam: BP 104/69 (BP Location: Left Arm, Patient Position: Sitting, Cuff Size: Normal)   Pulse 91   Ht 5\' 9"  (1.753 m)   Wt 182 lb 3.2 oz (82.6 kg)   LMP 08/23/2014   BMI 26.91 kg/m   Constitutional:  Alert and oriented, No acute distress. HEENT: [place] AT, moist mucus membranes.  Trachea midline, no masses. Cardiovascular: No clubbing, cyanosis, or edema. Respiratory: Normal respiratory effort, no increased work of breathing. GI: Abdomen is soft, nontender, nondistended, no abdominal masses GU: No CVA tenderness.  No stress incontinence Skin: No rashes, bruises or suspicious lesions. Lymph: No cervical or inguinal adenopathy. Neurologic: Grossly intact, no focal deficits, moving all 4 extremities. Psychiatric: Normal mood and affect.  Laboratory Data: Lab Results  Component Value Date   WBC 7.0 07/27/2017   HGB 13.6 07/27/2017   HCT 40.8 07/27/2017   MCV 96.4 07/27/2017   PLT 326.0 07/27/2017    Lab  Results  Component Value Date   CREATININE 0.90 07/27/2017    No results found for:  PSA  No results found for: TESTOSTERONE  Lab Results  Component Value Date   HGBA1C 5.9 07/22/2018    Urinalysis    Component Value Date/Time   COLORURINE STRAW (A) 10/13/2016 2120   APPEARANCEUR Clear 08/29/2018 1110   LABSPEC 1.004 (L) 10/13/2016 2120   PHURINE 7.0 10/13/2016 2120   GLUCOSEU Negative 08/29/2018 1110   HGBUR 1+ (A) 10/13/2016 2120   HGBUR moderate 11/13/2010 0945   BILIRUBINUR Negative 08/29/2018 1110   KETONESUR NEGATIVE 10/13/2016 2120   PROTEINUR Negative 08/29/2018 1110   PROTEINUR NEGATIVE 10/13/2016 2120   UROBILINOGEN 0.2 08/26/2018 1102   UROBILINOGEN 0.2 11/13/2010 0945   NITRITE Negative 08/29/2018 1110   NITRITE NEGATIVE 10/13/2016 2120   LEUKOCYTESUR Negative 08/29/2018 1110    Pertinent Imaging: Normal  Assessment & Plan: Patient was cleared for microscopic hematuria.  I will see her as needed  1. Microscopic hematuria  - Urinalysis, Complete   No follow-ups on file.  Reece Packer, MD  [facility] [address] [place], [place] [address] [phone]

## 2018-09-12 NOTE — Telephone Encounter (Signed)
Pt aware orders placed  Nothing further needed.

## 2018-09-22 DIAGNOSIS — G4733 Obstructive sleep apnea (adult) (pediatric): Secondary | ICD-10-CM | POA: Diagnosis not present

## 2018-09-26 DIAGNOSIS — M1990 Unspecified osteoarthritis, unspecified site: Secondary | ICD-10-CM

## 2018-09-26 MED ORDER — HYDROCODONE-ACETAMINOPHEN 10-325 MG PO TABS: 1.0000 | ORAL_TABLET | Freq: Four times a day (QID) | ORAL | 0 refills | Status: DC | PRN

## 2018-09-28 ENCOUNTER — Ambulatory Visit (INDEPENDENT_AMBULATORY_CARE_PROVIDER_SITE_OTHER): Payer: PPO

## 2018-09-28 ENCOUNTER — Encounter: Payer: Self-pay | Admitting: Family Medicine

## 2018-09-28 ENCOUNTER — Ambulatory Visit (INDEPENDENT_AMBULATORY_CARE_PROVIDER_SITE_OTHER): Payer: PPO | Admitting: Family Medicine

## 2018-09-28 VITALS — BP 122/76 | HR 88 | Temp 98.7°F | Ht 69.0 in | Wt 186.0 lb

## 2018-09-28 DIAGNOSIS — M25559 Pain in unspecified hip: Secondary | ICD-10-CM | POA: Insufficient documentation

## 2018-09-28 DIAGNOSIS — M7061 Trochanteric bursitis, right hip: Secondary | ICD-10-CM

## 2018-09-28 DIAGNOSIS — M25511 Pain in right shoulder: Secondary | ICD-10-CM

## 2018-09-28 NOTE — Assessment & Plan Note (Signed)
Acute on chronic flare of her lateral hip pain.  Appears to be related to gluten med resulting in bursitis.  Reports history hip dysplasia with no significant groin pain today. -Greater trochanter injection today. -Counseled on supportive care -If no improvement may need to consider intra-articular injection

## 2018-09-28 NOTE — Patient Instructions (Signed)
Nice to meet you  Please follow up if your hip pain doesn't improve  Please try the exercises for your shoulder  Please follow up in 2-3 weeks if your pain isn't improved

## 2018-09-28 NOTE — Progress Notes (Signed)
Emily Moon - 57 y.o. female MRN [id number]  Date of birth: [birth year]  SUBJECTIVE:  Including CC & ROS.  Chief Complaint  Patient presents with  . Hip Pain    pt c/o right hip pain x3 years, recently gotten worse. pt has dysplasia     Emily Moon is a 57 y.o. female that is presenting with right lateral hip pain and right shoulder pain.  The right lateral hip pain is acute on chronic in nature.  Is localized to the greater trochanter.  She reports a history of hip dysplasia with a left total hip arthroplasty.  This pain started a few weeks ago.  She denies any injury.  The pain is sharp and stabbing in nature.  The pain is worse with certain movements.  Pain is constant in severity.  She has tried physical therapy in the past.  Right lateral shoulder pain is acute on chronic in nature.  She denies inciting event.  She has a history of pain similar to this.  It is localized to the lateral shoulder.  Denies any radicular symptoms.  Denies any weakness with her grip.  Pain is worse with abduction.  Denies any prior surgeries.  Pain is moderate to severe.   Independent review of the AP of the pelvis from 2017 shows a left total hip arthroplasty and a normal-appearing right  Review of Systems  Constitutional: Negative for fever.  HENT: Negative for congestion.   Respiratory: Negative for cough.   Cardiovascular: Negative for chest pain.  Gastrointestinal: Negative for abdominal pain.  Musculoskeletal: Positive for arthralgias. Negative for back pain.  Skin: Negative for color change.  Neurological: Negative for weakness.  Hematological: Negative for adenopathy.  Psychiatric/Behavioral: Negative for agitation.    HISTORY: Past Medical, Surgical, Social, and Family History Reviewed & Updated per EMR.   Pertinent Historical Findings include:  Past Medical History:  Diagnosis Date  . Arthritis    osteo  . Asthma   . Bipolar disorder ([place]) 05/21/14  . Cataracts, bilateral     . DDD (degenerative disc disease), cervical    also back  . Depression   . Dizziness    Positional  . Edema    feet/legs  . Fibromyalgia syndrome   . Fungal infection    Finger nails  . GERD (gastroesophageal reflux disease)   . Gout   . Headache    seasonal allergies  . Heart palpitations   . 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 Hypercholesterolemia   . Multiple sclerosis (HCC)    weakness  . Nephrolithiasis    kidney stones  . Osteoporosis    osteoarthritis  . Pneumonia   . PONV (postoperative nausea and vomiting)    no problem after cataract surgery  . Psoriasis   . Renal stone   . Shortness of breath dyspnea    wheezing  . Sleep apnea 2012   sleep study / slight, no interventions  . Urinary frequency     Past Surgical History:  Procedure Laterality Date  . CATARACT EXTRACTION W/PHACO Left 05/21/2015   Procedure: CATARACT EXTRACTION PHACO AND INTRAOCULAR LENS PLACEMENT (IOC);  Surgeon: Birder Robson, MD;  Location: [facility];  Service: Ophthalmology;  Laterality: Left;  [place] 00:35 AP% 22.9 CDE 8.11 fluid pack lot #[id number] H  . CATARACT EXTRACTION W/PHACO Right 06/04/2015   Procedure: CATARACT EXTRACTION PHACO AND INTRAOCULAR LENS PLACEMENT (IOC);  Surgeon: Birder Robson, MD;  Location: [facility];  Service: Ophthalmology;  Laterality: Right;  US:00:48 AP%: 10.5  CDE:5.08 Fluid lot #[id number] H  . CYSTOSCOPY/URETEROSCOPY/HOLMIUM LASER/STENT PLACEMENT Bilateral 09/22/2016   Procedure: CYSTOSCOPY/URETEROSCOPY/HOLMIUM LASER/STENT PLACEMENT;  Surgeon: Hollice Espy, MD;  Location: [facility];  Service: Urology;  Laterality: Bilateral;  . EYE SURGERY  2015   tissue biopsy  . FOOT SURGERY  2015  . JOINT REPLACEMENT Left 2013   hip replacement  . LITHOTRIPSY    . PTOSIS REPAIR Bilateral 02/18/2016   Procedure: BILATERAL PTOSIS REPAIR UPPER EYELIDS;  Surgeon: Karle Starch, MD;  Location: [facility];  Service: Ophthalmology;  Laterality: Bilateral;  LEAVE PT EARLY  AM  . thumb surgery Right   . TONSILLECTOMY  1973    Allergies  Allergen Reactions  . Albuterol Shortness Of Breath and Other (See Comments)    Makes pt feel jittery/ tacycardic  . Crestor [Rosuvastatin] Other (See Comments)    Joint pain, muscle pain, and hair loss  . Halcion [Triazolam] Other (See Comments)    Dizziness,headaches,bladder problems  . Levaquin [Levofloxacin In D5w] Diarrhea and Itching    Shoulder pain  . Naproxen Sodium Swelling    Patient tolerates in small doses  . Tylenol [Acetaminophen] Swelling    Patient tolerates in small doses  . Cefaclor Other (See Comments)    Doesn't remember---unsure if actually allergic   . Diclofenac Sodium Other (See Comments)    "made very sick"  . Sulfa Antibiotics Itching    Unsure of reaction possibly itching  . Tramadol Itching and Nausea And Vomiting  . Aripiprazole Other (See Comments)    Muscle tension/cramping  . Ibuprofen Swelling    Patient tolerates in small doses    Family History  Problem Relation Age of Onset  . Cancer Father        Abdomen with mastasis  . Cancer Mother   . Heart disease Mother   . Kidney disease Neg Hx   . Bladder Cancer Neg Hx   . Prostate cancer Neg Hx   . Kidney cancer Neg Hx      Social History   Socioeconomic History  . Marital status: Divorced    Spouse name: Not on file  . Number of children: 1  . Years of education: Not on file  . Highest education level: Not on file  Occupational History  . Occupation: Therapist, art Rep at [employer]  . Financial resource strain: Not on file  . Food insecurity:    Worry: Not on file    Inability: Not on file  . Transportation needs:    Medical: Not on file    Non-medical: Not on file  Tobacco Use  . Smoking status: Former Smoker    Years: 25.00    Types: Cigarettes    Last attempt to quit: 11/16/2012    Years since quitting: 5.8  . Smokeless tobacco: Never Used  . Tobacco comment:  occasional use  Substance and Sexual Activity  . Alcohol use: No    Alcohol/week: 0.0 standard drinks  . Drug use: No    Types: Methylphenidate  . Sexual activity: Never  Lifestyle  . Physical activity:    Days per week: Not on file    Minutes per session: Not on file  . Stress: Not on file  Relationships  . Social connections:    Talks on phone: Not on file    Gets together: Not on file    Attends religious service: Not on file    Active member of club or organization:  Not on file    Attends meetings of clubs or organizations: Not on file    Relationship status: Not on file  . Intimate partner violence:    Fear of current or ex partner: Not on file    Emotionally abused: Not on file    Physically abused: Not on file    Forced sexual activity: Not on file  Other Topics Concern  . Not on file  Social History Narrative  . Not on file     PHYSICAL EXAM:  VS: BP 122/76 (BP Location: Right Arm, Patient Position: Sitting, Cuff Size: Normal)   Pulse 88   Temp 98.7 F (37.1 C) (Oral)   Ht 5\' 9"  (1.753 m)   Wt 186 lb (84.4 kg)   LMP 08/23/2014   SpO2 97%   BMI 27.47 kg/m  Physical Exam Gen: NAD, alert, cooperative with exam, well-appearing ENT: normal lips, normal nasal mucosa,  Eye: normal EOM, normal conjunctiva and lids CV:  no edema, +2 pedal pulses   Resp: no accessory muscle use, non-labored,  Skin: no rashes, no areas of induration  Neuro: normal tone, normal sensation to touch Psych:  normal insight, alert and oriented MSK:  Right shoulder: No tenderness to palpation of the AC joint. Normal active flexion and abduction. No pain with external rotation abduction. Positive empty can testing. Pain with O'Brien's testing. Negative speeds test. Right hip: Tenderness palpation of the greater trochanter. No tenderness palpation of the lumbar spine, SI joint, or piriformis. Normal internal and external rotation. Normal strength resistance with hip flexion, knee  flexion extension, plantarflexion and dorsiflexion. Negative straight leg raise Neurovascularly intact   Aspiration/Injection Procedure Note Emily Moon 1961/08/03  Procedure: Injection Indications: Right shoulder pain  Procedure Details Consent: Risks of procedure as well as the alternatives and risks of each were explained to the (patient/caregiver).  Consent for procedure obtained. Time Out: Verified patient identification, verified procedure, site/side was marked, verified correct patient position, special equipment/implants available, medications/allergies/relevent history reviewed, required imaging and test results available.  Performed.  The area was cleaned with iodine and alcohol swabs.    The right subacromial space was injected using 1 cc's of 40 mg Depomedrol and 4 cc's of 0.25% bupivacaine with a 25 1 1/2" needle.  Ultrasound was used. Images were obtained in Long views showing the injection.    A sterile dressing was applied.  Patient did tolerate procedure well.    Aspiration/Injection Procedure Note Emily Moon 1961-06-21  Procedure: Injection Indications: Right lateral hip pain  Procedure Details Consent: Risks of procedure as well as the alternatives and risks of each were explained to the (patient/caregiver).  Consent for procedure obtained. Time Out: Verified patient identification, verified procedure, site/side was marked, verified correct patient position, special equipment/implants available, medications/allergies/relevent history reviewed, required imaging and test results available.  Performed.  The area was cleaned with iodine and alcohol swabs.    The right greater trochanter bursa was injected using 1 cc's of 40 mg Depomedrol and 4 cc's of 0.25% bupivacaine with a 22 3 1/2" needle.  Ultrasound was used. Images were obtained in Transverse views showing the injection.    A sterile dressing was applied.  Patient did tolerate procedure  well.    ASSESSMENT & PLAN:   Greater trochanteric bursitis of right hip Acute on chronic flare of her lateral hip pain.  Appears to be related to gluten med resulting in bursitis.  Reports history hip dysplasia with no significant groin pain today. -  Greater trochanter injection today. -Counseled on supportive care -If no improvement may need to consider intra-articular injection  Acute pain of right shoulder Pain seems to be related to impingement and has some signs of tendinopathy on exam.  AC joint does have degenerative changes but no effusion. -Subacromial injection today. -Counseled on home exercise therapy and supportive care. -If no improvement will consider physical therapy and imaging

## 2018-09-28 NOTE — Assessment & Plan Note (Signed)
Pain seems to be related to impingement and has some signs of tendinopathy on exam.  AC joint does have degenerative changes but no effusion. -Subacromial injection today. -Counseled on home exercise therapy and supportive care. -If no improvement will consider physical therapy and imaging

## 2018-09-29 ENCOUNTER — Encounter: Payer: Self-pay | Admitting: Internal Medicine

## 2018-09-29 ENCOUNTER — Encounter: Payer: Self-pay | Admitting: Family Medicine

## 2018-09-29 DIAGNOSIS — G4719 Other hypersomnia: Secondary | ICD-10-CM

## 2018-10-05 ENCOUNTER — Ambulatory Visit: Payer: PPO | Admitting: Family Medicine

## 2018-10-18 ENCOUNTER — Other Ambulatory Visit: Payer: Self-pay | Admitting: *Deleted

## 2018-10-18 DIAGNOSIS — G4733 Obstructive sleep apnea (adult) (pediatric): Secondary | ICD-10-CM

## 2018-10-18 NOTE — Progress Notes (Signed)
dme

## 2018-10-20 ENCOUNTER — Ambulatory Visit (INDEPENDENT_AMBULATORY_CARE_PROVIDER_SITE_OTHER): Payer: PPO | Admitting: Internal Medicine

## 2018-10-20 ENCOUNTER — Encounter: Payer: Self-pay | Admitting: Internal Medicine

## 2018-10-20 VITALS — BP 122/82 | HR 109 | Ht 69.0 in | Wt 184.0 lb

## 2018-10-20 DIAGNOSIS — G4733 Obstructive sleep apnea (adult) (pediatric): Secondary | ICD-10-CM | POA: Diagnosis not present

## 2018-10-20 NOTE — Progress Notes (Signed)
Name: Emily Moon MRN: [id number] DOB: 01-10-1961     CONSULTATION DATE: 9.12.19 REFERRING MD : Carlis Abbott  CHIEF COMPLAINT: follow up sleep apnea STUDIES:    11.20.18  CXR independently reviewed by Me today No acute pneumonia No effusions NL looking CXR   HISTORY OF PRESENT ILLNESS: Patient follow up for OSA Has severe Sleep apnea AHI 35  Patient started on AutoCPAP 5-20 cm h20 AHI down to 3  She has muluitple comaplints of mask, intermittent leaks and high pressures  Overall, patient does benefit from autoCPAP therapy  No signs pf infection No signs of CHF       Smoking Assessment and Cessation Counseling   Upon further questioning, Patient smokes 1/2 ppd  I have advised patient to quit/stop smoking as soon as possible due to high risk for multiple medical problems  Patient  is NOT willing to quit smoking  I have advised patient that we can assist and have options of Nicotine replacement therapy. I also advised patient on behavioral therapy and can provide oral medication therapy in conjunction with the other therapies  Follow up next Office visit  for assessment of smoking cessation  Smoking cessation counseling advised for 4 minutes      PAST MEDICAL HISTORY :   has a past medical history of Arthritis, Asthma, Bipolar disorder ([place]) (05/21/14), Cataracts, bilateral, DDD (degenerative disc disease), cervical, Depression, Dizziness, Edema, Fibromyalgia syndrome, Fungal infection, GERD (gastroesophageal reflux disease), Gout, Headache, Heart palpitations, Hip dysplasia, congenital (09/15/2013), Hypercholesterolemia, Multiple sclerosis ([place]), Nephrolithiasis, Osteoporosis, Pneumonia, PONV (postoperative nausea and vomiting), Psoriasis, Renal stone, Shortness of breath dyspnea, Sleep apnea (2012), and Urinary frequency.  has a past surgical history that includes Lithotripsy; Cataract extraction w/PHACO (Left, 05/21/2015); Foot surgery (2015); Cataract  extraction w/PHACO (Right, 06/04/2015); Tonsillectomy (1973); Eye surgery (2015); Joint replacement (Left, 2013); thumb surgery (Right); Ptosis repair (Bilateral, 02/18/2016); and Cystoscopy/ureteroscopy/holmium laser/stent placement (Bilateral, 09/22/2016). Prior to Admission medications   Medication Sig Start Date End Date Taking? Authorizing Provider  ASPERCREME LIDOCAINE EX Apply 1 application topically 4 (four) times daily as needed (for pain.).    Yes [provider]  aspirin 325 MG tablet Take 325 mg by mouth every 4 (four) hours as needed for headache.    Yes [provider]  BIOTIN PO Take 100 mg by mouth daily.   Yes [provider]  calcipotriene-betamethasone (TACLONEX) ointment Apply 1 application topically daily as needed (for psorasis).    Yes [provider]  Cholecalciferol (VITAMIN D3) 10000 units capsule Take 30,000 Units by mouth daily.    Yes [provider]  clobetasol cream (TEMOVATE) 7.82 % Apply 1 application topically 2 (two) times daily. 12/16/16  Yes Copland, Frederico Hamman, MD  cyanocobalamin (,VITAMIN B-12,) 1000 MCG/ML injection Inject 1 mL (1,000 mcg total) into the muscle every 30 (thirty) days. For 6 Months from 11/2017. 12/28/17  Yes [name], NP  diclofenac sodium (VOLTAREN) 1 % GEL Apply 4 g topically 4 (four) times daily. Patient taking differently: Apply 4 g topically 4 (four) times daily as needed (for pain.).  11/15/14  Yes Baity, Coralie Keens, NP  docusate sodium (COLACE) 100 MG capsule Take 1 capsule (100 mg total) by mouth 2 (two) times daily. Patient taking differently: Take 100 mg by mouth 2 (two) times daily as needed.  09/22/16  Yes Hollice Espy, MD  DULoxetine (CYMBALTA) 20 MG capsule TAKE 2 CAPSULES (40 MG TOTAL) BY MOUTH AT BEDTIME. 05/27/18  Yes Plovsky, Berneta Sages, MD  HYDROcodone-acetaminophen (  NORCO) 10-325 MG tablet Take 1 tablet by mouth every 6 (six) hours as needed for moderate pain or severe pain.  Supervising physician; Dr Eliezer Lofts; DEA# [id number] 06/27/18  Yes [name], NP  hydroquinone 4 % cream 1 APPLICATION TOPICALLY DAILY AS NEEDED FOR BLEMISHES. 05/16/15  Yes [provider]  hydrOXYzine (VISTARIL) 50 MG capsule 1   q  Noon  1  q 6:00 pm  1  prn 02/09/18  Yes Plovsky, Berneta Sages, MD  ketoconazole (NIZORAL) 2 % cream Apply 1 application topically daily. 07/12/18  Yes [name], NP  lamoTRIgine (LAMICTAL) 150 MG tablet Take 2 tablets (300 mg total) by mouth at bedtime. 05/20/18  Yes Plovsky, Berneta Sages, MD  levalbuterol [facility] HFA) 45 MCG/ACT inhaler Inhale 1-2 puffs into the lungs every 6 (six) hours as needed for wheezing. 03/30/17  Yes Bedsole, Amy E, MD  Lysine 1000 MG TABS Take 2,000-4,000 mg by mouth at bedtime. 2000 mg scheduled at bedtime and patient will take 4000 mg if she has outbreak   Yes [provider]  Magnesium 500 MG TABS Take 1 tablet by mouth at bedtime.   Yes [provider]  neomycin-bacitracin-polymyxin (NEOSPORIN) [phone] ointment Apply 1 application topically 4 (four) times daily as needed (for cut/scrapes.).   Yes [provider]  nicotine (NICODERM CQ - DOSED IN MG/24 HOURS) 21 mg/24hr patch Place 1 patch (21 mg total) onto the skin daily. 02/20/16  Yes [name], NP  phenazopyridine (AZO-TABS) 95 MG tablet Take 95 mg by mouth 3 (three) times daily as needed for pain.   Yes [provider]  polyethylene glycol powder (GLYCOLAX/MIRALAX) powder MIX 17 GRAMS (1 CAPFUL) WITH 4-8 OZ OF LIQUID AND TAKE BY MOUTH TWICE DAILY AS NEEDED 11/24/17  Yes [name], NP  temazepam (RESTORIL) 30 MG capsule TAKE ONE TO TWO CAPSULES BY MOUTH NIGHTLY AT BEDTIME 05/20/18  Yes Plovsky, Berneta Sages, MD  tiZANidine (ZANAFLEX) 4 MG tablet TAKE 2 TABLETS (8 MG TOTAL) BY MOUTH EVERY 6 (SIX) HOURS AS NEEDED FOR MUSCLE SPASMS. 01/11/18  Yes [name], NP  traZODone (DESYREL) 50 MG tablet Take 2 tablets (100 mg total) by  mouth at bedtime. 1  qhs  For 3 days if fails the 2  qhs 05/20/18  Yes Plovsky, Berneta Sages, MD  zolpidem (AMBIEN) 10 MG tablet Take 1 tablet (10 mg total) by mouth at bedtime. 05/20/18  Yes Norma Fredrickson, MD   Allergies  Allergen Reactions  . Albuterol Shortness Of Breath and Other (See Comments)    Makes pt feel jittery/ tacycardic  . Crestor [Rosuvastatin] Other (See Comments)    Joint pain, muscle pain, and hair loss  . Halcion [Triazolam] Other (See Comments)    Dizziness,headaches,bladder problems  . Levaquin [Levofloxacin In D5w] Diarrhea and Itching    Shoulder pain  . Naproxen Sodium Swelling    Patient tolerates in small doses  . Tylenol [Acetaminophen] Swelling    Patient tolerates in small doses  . Cefaclor Other (See Comments)    Doesn't remember---unsure if actually allergic   . Diclofenac Sodium Other (See Comments)    "made very sick"  . Sulfa Antibiotics Itching    Unsure of reaction possibly itching  . Tramadol Itching and Nausea And Vomiting  . Aripiprazole Other (See Comments)    Muscle tension/cramping  . Ibuprofen Swelling    Patient tolerates in small doses     Review of Systems:  Gen:  Denies  fever, sweats, chills  weigh loss  HEENT: Denies blurred vision, double vision, ear pain, eye pain, hearing loss, nose bleeds, sore throat Cardiac:  No dizziness, chest pain or heaviness, chest tightness,edema, No JVD Resp:   No cough, -sputum production, -shortness of breath,-wheezing, -hemoptysis,  Gi: Denies swallowing difficulty, stomach pain, nausea or vomiting, diarrhea, constipation, bowel incontinence Gu:  Denies bladder incontinence, burning urine Ext:   Denies Joint pain, stiffness or swelling Skin: Denies  skin rash, easy bruising or bleeding or hives Endoc:  Denies polyuria, polydipsia , polyphagia or weight change Psych:   Denies depression, insomnia or hallucinations  Other:  All other systems negative    BP 122/82 (BP Location: Left Arm, Cuff Size:  Normal)   Pulse (!) 109   Ht 5\' 9"  (1.753 m)   Wt 184 lb (83.5 kg)   LMP 08/23/2014   SpO2 97%   BMI 27.17 kg/m    Physical Examination:   GENERAL:NAD, no fevers, chills, no weakness no fatigue HEAD: Normocephalic, atraumatic.  EYES: Pupils equal, round, reactive to light. Extraocular muscles intact. No scleral icterus.  MOUTH: Moist mucosal membrane. Dentition intact. No abscess noted.  EAR, NOSE, THROAT: Clear without exudates. No external lesions.  NECK: Supple. No thyromegaly. No nodules. No JVD.  PULMONARY: CTA B/L no wheezing, rhonchi, crackles CARDIOVASCULAR: S1 and S2. Regular rate and rhythm. No murmurs, rubs, or gallops. No edema. Pedal pulses 2+ bilaterally.  GASTROINTESTINAL: Soft, nontender, nondistended. No masses. Positive bowel sounds. No hepatosplenomegaly.  MUSCULOSKELETAL: No swelling, clubbing, or edema. Range of motion full in all extremities.  NEUROLOGIC: Cranial nerves II through XII are intact. No gross focal neurological deficits. Sensation intact. Reflexes intact.  SKIN: No ulceration, lesions, rashes, or cyanosis. Skin warm and dry. Turgor intact.  PSYCHIATRIC: Mood, affect within normal limits. The patient is awake, alert and oriented x 3. Insight, judgment intact.  ALL OTHER ROS ARE NEGATIVE    ASSESSMENT / PLAN: 57 year old white female with signs and symptoms of severe sleep apnea Patient has an AHI of 35 but with therapy her AHI is down to 3 Patient currently on auto CPAP 5 to 20 cm of water pressure When she does use her auto CPAP her AHI is reduced  However patient has multiple complaints and issues with the mask along with the leaks I have explained to her that she will need a referral to mask fitting session in [place] I have advised that if the mask fitting session does not help she will likely need an oral device evaluation for her underlying sleep apnea    Recommend smoking cessation   Follow-up in 3 months  [place] Paradise Patricia Pesa,  M.D.  [facility]  Medical Director [place] Director [facility] Cardio-Pulmonary Department

## 2018-10-20 NOTE — Patient Instructions (Addendum)
Refer to [facility] for mask fitting services  STOP SMOKING!!

## 2018-10-21 ENCOUNTER — Other Ambulatory Visit (HOSPITAL_COMMUNITY): Payer: Self-pay | Admitting: Psychiatry

## 2018-10-21 ENCOUNTER — Other Ambulatory Visit: Payer: Self-pay | Admitting: Primary Care

## 2018-10-21 DIAGNOSIS — M62838 Other muscle spasm: Secondary | ICD-10-CM

## 2018-10-21 NOTE — Telephone Encounter (Signed)
Last prescribed on 01/11/2018 Last office visit on 08/26/2018

## 2018-10-21 NOTE — Telephone Encounter (Signed)
Noted, refill sent to pharmacy. 

## 2018-10-22 DIAGNOSIS — G4733 Obstructive sleep apnea (adult) (pediatric): Secondary | ICD-10-CM | POA: Diagnosis not present

## 2018-10-24 ENCOUNTER — Encounter: Payer: Self-pay | Admitting: Primary Care

## 2018-10-24 ENCOUNTER — Ambulatory Visit (INDEPENDENT_AMBULATORY_CARE_PROVIDER_SITE_OTHER): Payer: PPO | Admitting: Primary Care

## 2018-10-24 VITALS — BP 114/78 | HR 94 | Temp 98.6°F | Ht 69.0 in | Wt 188.2 lb

## 2018-10-24 DIAGNOSIS — M2540 Effusion, unspecified joint: Secondary | ICD-10-CM | POA: Diagnosis not present

## 2018-10-24 DIAGNOSIS — J3089 Other allergic rhinitis: Secondary | ICD-10-CM | POA: Diagnosis not present

## 2018-10-24 LAB — BASIC METABOLIC PANEL
BUN: 16 mg/dL (ref 6–23)
CO2: 24 mEq/L (ref 19–32)
Calcium: 9.2 mg/dL (ref 8.4–10.5)
Chloride: 103 mEq/L (ref 96–112)
Creatinine, Ser: 0.78 mg/dL (ref 0.40–1.20)
GFR: 81.04 mL/min (ref >60.00)
Glucose, Bld: 109 mg/dL — ABNORMAL HIGH (ref 70–99)
POTASSIUM: 4.1 meq/L (ref 3.5–5.1)
Sodium: 138 mEq/L (ref 135–145)

## 2018-10-24 LAB — CBC
HCT: 39.8 % (ref 36.0–46.0)
HEMOGLOBIN: 13.5 g/dL (ref 12.0–15.0)
MCHC: 34.1 g/dL (ref 30.0–36.0)
MCV: 96.2 fl (ref 78.0–100.0)
PLATELETS: 340 10*3/uL (ref 150.0–400.0)
RBC: 4.13 Mil/uL (ref 3.87–5.11)
RDW: 13.7 % (ref 11.5–15.5)
WBC: 8.2 10*3/uL (ref 4.0–10.5)

## 2018-10-24 LAB — URIC ACID: Uric Acid, Serum: 4.5 mg/dL (ref 2.4–7.0)

## 2018-10-24 LAB — C-REACTIVE PROTEIN: CRP: 0.2 mg/dL — ABNORMAL LOW (ref 0.5–20.0)

## 2018-10-24 MED ORDER — AZELASTINE HCL 0.1 % NA SOLN: 2.0000 | Freq: Two times a day (BID) | NASAL | 0 refills | Status: DC

## 2018-10-24 NOTE — Assessment & Plan Note (Signed)
Noted to right elbow today, also evident on her phone pictures. Check labs today including ANA, RF, CCP, CRP, CBC. She will use her Voltaren Gel PRN for now. Await labs. Consider rheumatology referral.

## 2018-10-24 NOTE — Progress Notes (Signed)
Subjective:    Patient ID: Emily Moon, female    DOB: 1961-11-05, 56 y.o.   MRN: [id number]  HPI  Ms. Bea Laura is a 57 year old female who presents today with a chief complaint of nasal congestion. She'd also like to discuss joint swelling/redness/pain.   She reports congestion to the right (sometimes left) nostril for the last several months. She is not using her CPAP due to mouth dryness and inability to sleep. She's been using Zyrtec, Flonase, and nasal saline spray without improvement. She does use saline jelly with some improvement. Her congestion is constant, worse during the evening. She will be seeing a specialist this week to discuss nasal CPAP options. She denies rhinorrhea, bleeding. She has had to use her levoalbuterol inhaler more frequently recently. She's had no cigarettes since Saturday this week. She's using topical lidocaine and aspercream without much improvement.   She's also concerned that she may have psoriatic arthritis and/or Lupus. She has noticed inflammation, redness, and swelling to the joints of her right elbow and right knee that began 2 days ago, this is the first event. Also with rashes to her face. She has pictures of her swelling and rashes on her phone which she shares today. She has been under a lot of stress over the last year given the death of sister.   Review of Systems  HENT: Positive for congestion. Negative for rhinorrhea.   Musculoskeletal: Positive for arthralgias and joint swelling.  Skin: Positive for color change.  Allergic/Immunologic: Positive for environmental allergies.       Past Medical History:  Diagnosis Date  . Arthritis    osteo  . Asthma   . Bipolar disorder ([place]) 05/21/14  . Cataracts, bilateral   . DDD (degenerative disc disease), cervical    also back  . Depression   . Dizziness    Positional  . Edema    feet/legs  . Fibromyalgia syndrome   . Fungal infection    Finger nails  . GERD (gastroesophageal reflux  disease)   . Gout   . Headache    seasonal allergies  . Heart palpitations   . 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 Hypercholesterolemia   . Multiple sclerosis ([facility])    weakness  . Nephrolithiasis    kidney stones  . Osteoporosis    osteoarthritis  . Pneumonia   . PONV (postoperative nausea and vomiting)    no problem after cataract surgery  . Psoriasis   . Renal stone   . Shortness of breath dyspnea    wheezing  . Sleep apnea 2012   sleep study / slight, no interventions  . Urinary frequency      Social History   Socioeconomic History  . Marital status: Divorced    Spouse name: Not on file  . Number of children: 1  . Years of education: Not on file  . Highest education level: Not on file  Occupational History  . Occupation: Therapist, art Rep at [employer]  . Financial resource strain: Not on file  . Food insecurity:    Worry: Not on file    Inability: Not on file  . Transportation needs:    Medical: Not on file    Non-medical: Not on file  Tobacco Use  . Smoking status: Former Smoker    Years: 25.00    Types: Cigarettes    Last attempt to quit: 11/16/2012    Years since quitting: 5.9  .  Smokeless tobacco: Never Used  . Tobacco comment: occasional use  Substance and Sexual Activity  . Alcohol use: No    Alcohol/week: 0.0 standard drinks  . Drug use: No    Types: Methylphenidate  . Sexual activity: Never  Lifestyle  . Physical activity:    Days per week: Not on file    Minutes per session: Not on file  . Stress: Not on file  Relationships  . Social connections:    Talks on phone: Not on file    Gets together: Not on file    Attends religious service: Not on file    Active member of club or organization: Not on file    Attends meetings of clubs or organizations: Not on file    Relationship status: Not on file  . Intimate partner violence:    Fear of current or ex partner: Not on file    Emotionally abused: Not  on file    Physically abused: Not on file    Forced sexual activity: Not on file  Other Topics Concern  . Not on file  Social History Narrative  . Not on file    Past Surgical History:  Procedure Laterality Date  . CATARACT EXTRACTION W/PHACO Left 05/21/2015   Procedure: CATARACT EXTRACTION PHACO AND INTRAOCULAR LENS PLACEMENT (IOC);  Surgeon: Birder Robson, MD;  Location: [facility] ORS;  Service: Ophthalmology;  Laterality: Left;  [place] 00:35 AP% 22.9 CDE 8.11 fluid pack lot #[id number]  . CATARACT EXTRACTION W/PHACO Right 06/04/2015   Procedure: CATARACT EXTRACTION PHACO AND INTRAOCULAR LENS PLACEMENT (IOC);  Surgeon: Birder Robson, MD;  Location: [facility] ORS;  Service: Ophthalmology;  Laterality: Right;  US:00:48 AP%: 10.5 CDE:5.08 Fluid lot #[id number]  . CYSTOSCOPY/URETEROSCOPY/HOLMIUM LASER/STENT PLACEMENT Bilateral 09/22/2016   Procedure: CYSTOSCOPY/URETEROSCOPY/HOLMIUM LASER/STENT PLACEMENT;  Surgeon: Hollice Espy, MD;  Location: [facility] ORS;  Service: Urology;  Laterality: Bilateral;  . EYE SURGERY  2015   tissue biopsy  . FOOT SURGERY  2015  . JOINT REPLACEMENT Left 2013   hip replacement  . LITHOTRIPSY    . PTOSIS REPAIR Bilateral 02/18/2016   Procedure: BILATERAL PTOSIS REPAIR UPPER EYELIDS;  Surgeon: Karle Starch, MD;  Location: [place];  Service: Ophthalmology;  Laterality: Bilateral;  LEAVE PT EARLY AM  . thumb surgery Right   . TONSILLECTOMY  1973    Family History  Problem Relation Age of Onset  . Cancer Father        Abdomen with mastasis  . Cancer Mother   . Heart disease Mother   . Kidney disease Neg Hx   . Bladder Cancer Neg Hx   . Prostate cancer Neg Hx   . Kidney cancer Neg Hx     Allergies  Allergen Reactions  . Albuterol Shortness Of Breath and Other (See Comments)    Makes pt feel jittery/ tacycardic  . Crestor [Rosuvastatin] Other (See Comments)    Joint pain, muscle pain, and hair loss  . Halcion [Triazolam] Other (See Comments)     Dizziness,headaches,bladder problems  . Levaquin [Levofloxacin In D5w] Diarrhea and Itching    Shoulder pain  . Naproxen Sodium Swelling    Patient tolerates in small doses  . Tylenol [Acetaminophen] Swelling    Patient tolerates in small doses  . Cefaclor Other (See Comments)    Doesn't remember---unsure if actually allergic   . Diclofenac Sodium Other (See Comments)    "made very sick"  . Sulfa Antibiotics Itching    Unsure of reaction possibly itching  .  Tramadol Itching and Nausea And Vomiting  . Aripiprazole Other (See Comments)    Muscle tension/cramping  . Ibuprofen Swelling    Patient tolerates in small doses    Current Outpatient Medications on File Prior to Visit  Medication Sig Dispense Refill  . ASPERCREME LIDOCAINE EX Apply 1 application topically 4 (four) times daily as needed (for pain.).     Marland Kitchen aspirin 325 MG tablet Take 325 mg by mouth every 4 (four) hours as needed for headache.     Marland Kitchen BIOTIN PO Take 100 mg by mouth daily.    . calcipotriene-betamethasone (TACLONEX) ointment Apply 1 application topically daily as needed (for psorasis).     . Cholecalciferol (VITAMIN D3) 10000 units capsule Take 30,000 Units by mouth daily.     . clobetasol cream (TEMOVATE) 1.61 % Apply 1 application topically 2 (two) times daily. 30 g 2  . cyanocobalamin (,VITAMIN B-12,) 1000 MCG/ML injection Inject 1 mL (1,000 mcg total) into the muscle every 3 (three) months. 1 mL 0  . diclofenac sodium (VOLTAREN) 1 % GEL Apply 4 g topically 4 (four) times daily. 500 g 5  . docusate sodium (COLACE) 100 MG capsule Take 1 capsule (100 mg total) by mouth 2 (two) times daily. 60 capsule 0  . DULoxetine (CYMBALTA) 20 MG capsule Take 2 capsules (40 mg total) by mouth at bedtime. 180 capsule 3  . HYDROcodone-acetaminophen (NORCO) 10-325 MG tablet Take 1 tablet by mouth every 6 (six) hours as needed for moderate pain or severe pain. Supervising physician; Dr Eliezer Lofts; DEA# [id number] 120 tablet 0  .  hydroquinone 4 % cream 1 APPLICATION TOPICALLY DAILY AS NEEDED FOR BLEMISHES.  3  . hydrOXYzine (VISTARIL) 50 MG capsule 1   q  Noon  1  q 6:00 pm  1  prn 90 capsule 5  . ketoconazole (NIZORAL) 2 % cream Apply 1 application topically daily. 30 g 0  . lamoTRIgine (LAMICTAL) 150 MG tablet Take 2 tablets (300 mg total) by mouth at bedtime. 60 tablet 5  . levalbuterol (XOPENEX HFA) 45 MCG/ACT inhaler Inhale 1-2 puffs into the lungs every 6 (six) hours as needed for wheezing. 1 Inhaler 0  . Lysine 1000 MG TABS Take 2,000-4,000 mg by mouth at bedtime. 2000 mg scheduled at bedtime and patient will take 4000 mg if she has outbreak    . Magnesium 500 MG TABS Take 1 tablet by mouth at bedtime.    Marland Kitchen neomycin-bacitracin-polymyxin (NEOSPORIN) [phone] ointment Apply 1 application topically 4 (four) times daily as needed (for cut/scrapes.).    Marland Kitchen nicotine (NICODERM CQ - DOSED IN MG/24 HOURS) 21 mg/24hr patch Place 1 patch (21 mg total) onto the skin daily. 28 patch 3  . phenazopyridine (AZO-TABS) 95 MG tablet Take 95 mg by mouth 3 (three) times daily as needed for pain.    . polyethylene glycol powder (GLYCOLAX/MIRALAX) powder MIX 17 GRAMS (1 CAPFUL) WITH 4-8 OZ OF LIQUID AND TAKE BY MOUTH TWICE DAILY AS NEEDED 527 g 0  . tamsulosin (FLOMAX) 0.4 MG CAPS capsule Take 1 capsule (0.4 mg total) by mouth daily. 30 capsule 11  . temazepam (RESTORIL) 30 MG capsule TAKE ONE TO TWO CAPSULES BY MOUTH NIGHTLY AT BEDTIME 60 capsule 4  . tiZANidine (ZANAFLEX) 4 MG tablet TAKE 2 TABLETS (8 MG TOTAL) BY MOUTH EVERY 6 (SIX) HOURS AS NEEDED FOR MUSCLE SPASMS. 720 tablet 0  . traZODone (DESYREL) 50 MG tablet Take 2 tablets (100 mg total) by mouth at bedtime. 1  qhs  For 3 days if fails the 2  qhs 30 tablet 7  . zolpidem (AMBIEN) 10 MG tablet Take 1 tablet (10 mg total) by mouth at bedtime. 30 tablet 5   No current facility-administered medications on file prior to visit.     BP 114/78   Pulse 94   Temp 98.6 F (37 C) (Oral)    Ht 5\' 9"  (1.753 m)   Wt 188 lb 4 oz (85.4 kg)   LMP 08/23/2014   SpO2 98%   BMI 27.80 kg/m    Objective:   Physical Exam  Constitutional: She appears well-nourished.  Cardiovascular: Normal rate and regular rhythm.  Respiratory: Effort normal and breath sounds normal.  Musculoskeletal:       Right elbow: She exhibits swelling. She exhibits normal range of motion. No tenderness found.       Left elbow: She exhibits normal range of motion and no swelling. No tenderness found.       Right knee: She exhibits swelling. She exhibits normal range of motion and no erythema.       Left knee: She exhibits normal range of motion and no swelling. No tenderness found.  Mild swelling to right anterior patella.   Skin: Skin is warm and dry. No erythema.  Mild scaling to right elbow           Assessment & Plan:

## 2018-10-24 NOTE — Assessment & Plan Note (Signed)
Stop Flonase. Start azelastine nasal spray. Continue Zyrtec for now. Consider switching to Xyzal vs Singulair. She will update.

## 2018-10-24 NOTE — Patient Instructions (Addendum)
Stop by the lab prior to leaving today. I will notify you of your results once received.   Try the azelastine nasal spray for allergies. Instill 2 sprays into each nostril twice daily. Stop Flonase. Continue Zyrtec, stop if it causes too much dryness or nasal bleeding.  You can try using the Voltaren Gel as needed for pain and inflammation to the joints.  It was a pleasure to see you today!

## 2018-10-25 DIAGNOSIS — M1990 Unspecified osteoarthritis, unspecified site: Secondary | ICD-10-CM

## 2018-10-25 MED ORDER — HYDROCODONE-ACETAMINOPHEN 10-325 MG PO TABS: 1.0000 | ORAL_TABLET | Freq: Four times a day (QID) | ORAL | 0 refills | Status: DC | PRN

## 2018-10-26 LAB — CYCLIC CITRUL PEPTIDE ANTIBODY, IGG

## 2018-10-26 LAB — ANA: Anti Nuclear Antibody(ANA): NEGATIVE

## 2018-10-26 LAB — RHEUMATOID FACTOR: Rheumatoid fact SerPl-aCnc: <14 IU/mL (ref <14)

## 2018-10-27 ENCOUNTER — Ambulatory Visit: Payer: Self-pay | Admitting: Internal Medicine

## 2018-10-30 ENCOUNTER — Other Ambulatory Visit (HOSPITAL_COMMUNITY): Payer: Self-pay | Admitting: Psychiatry

## 2018-11-01 ENCOUNTER — Other Ambulatory Visit (HOSPITAL_COMMUNITY): Payer: Self-pay

## 2018-11-01 MED ORDER — TRAZODONE HCL 50 MG PO TABS: 100.0000 mg | ORAL_TABLET | Freq: Every day | ORAL | 7 refills | Status: DC

## 2018-11-02 ENCOUNTER — Other Ambulatory Visit (HOSPITAL_BASED_OUTPATIENT_CLINIC_OR_DEPARTMENT_OTHER): Payer: Self-pay

## 2018-11-11 DIAGNOSIS — G4733 Obstructive sleep apnea (adult) (pediatric): Secondary | ICD-10-CM | POA: Diagnosis not present

## 2018-11-15 ENCOUNTER — Other Ambulatory Visit: Payer: Self-pay | Admitting: Primary Care

## 2018-11-15 DIAGNOSIS — J3089 Other allergic rhinitis: Secondary | ICD-10-CM

## 2018-11-18 ENCOUNTER — Ambulatory Visit: Payer: Self-pay | Admitting: Internal Medicine

## 2018-11-21 ENCOUNTER — Ambulatory Visit (INDEPENDENT_AMBULATORY_CARE_PROVIDER_SITE_OTHER): Payer: PPO | Admitting: Internal Medicine

## 2018-11-21 ENCOUNTER — Ambulatory Visit: Payer: Self-pay | Admitting: Internal Medicine

## 2018-11-21 ENCOUNTER — Encounter: Payer: Self-pay | Admitting: Internal Medicine

## 2018-11-21 VITALS — BP 118/72 | HR 107 | Ht 69.0 in | Wt 190.6 lb

## 2018-11-21 DIAGNOSIS — G4733 Obstructive sleep apnea (adult) (pediatric): Secondary | ICD-10-CM

## 2018-11-21 NOTE — Progress Notes (Signed)
Name: Emily Moon MRN: [id number] DOB: [birth year]     CONSULTATION DATE: 9.12.19 REFERRING MD : Carlis Abbott  CHIEF COMPLAINT: follow up sleep apnea STUDIES:    11.20.18  CXR independently reviewed by Me today No acute pneumonia No effusions NL looking CXR   HISTORY OF PRESENT ILLNESS: +OSA severe OSA  AHI 35  started on CPAP  She hates the mask and how it makes her feel I have stated that her sleep apnea is controlled but she does NOT want to wear the mask anymore  AHI down to 3.9  She was seen and evaluated in [place] but it did NOT help   No signs of infection     Smoking Assessment and Cessation Counseling   Upon further questioning, Patient smokes 1/2 ppd  I have advised patient to quit/stop smoking as soon as possible due to high risk for multiple medical problems  Patient is NOT willing to quit smoking  I have advised patient that we can assist and have options of Nicotine replacement therapy. I also advised patient on behavioral therapy and can provide oral medication therapy in conjunction with the other therapies  Follow up next Office visit  for assessment of smoking cessation  Smoking cessation counseling advised for 4 minutes    Review of Systems:  Gen:  Denies  fever, sweats, chills weigh loss  HEENT: Denies blurred vision, double vision, ear pain, eye pain, hearing loss, nose bleeds, sore throat Cardiac:  No dizziness, chest pain or heaviness, chest tightness,edema, No JVD Resp:   No cough, -sputum production, -shortness of breath,-wheezing, -hemoptysis,  Gi: Denies swallowing difficulty, stomach pain, nausea or vomiting, diarrhea, constipation, bowel incontinence Gu:  Denies bladder incontinence, burning urine Ext:   Denies Joint pain, stiffness or swelling Skin: Denies  skin rash, easy bruising or bleeding or hives Endoc:  Denies polyuria, polydipsia , polyphagia or weight change Psych:   Denies depression, insomnia or hallucinations  Other:   All other systems negative     BP 118/72   Pulse (!) 107   Ht 5\' 9"  (1.753 m)   Wt 190 lb 9.6 oz (86.5 kg)   LMP 08/23/2014   SpO2 95%   BMI 28.15 kg/m    Physical Examination:   GENERAL:NAD, no fevers, chills, no weakness no fatigue HEAD: Normocephalic, atraumatic.  EYES: Pupils equal, round, reactive to light. Extraocular muscles intact. No scleral icterus.  MOUTH: Moist mucosal membrane. Dentition intact. No abscess noted.  EAR, NOSE, THROAT: Clear without exudates. No external lesions.  NECK: Supple. No thyromegaly. No nodules. No JVD.  PULMONARY: CTA B/L no wheezing, rhonchi, crackles CARDIOVASCULAR: S1 and S2. Regular rate and rhythm. No murmurs, rubs, or gallops. No edema. Pedal pulses 2+ bilaterally.  GASTROINTESTINAL: Soft, nontender, nondistended. No masses. Positive bowel sounds. No hepatosplenomegaly.  MUSCULOSKELETAL: No swelling, clubbing, or edema. Range of motion full in all extremities.  NEUROLOGIC: Cranial nerves II through XII are intact. No gross focal neurological deficits. Sensation intact. Reflexes intact.  SKIN: No ulceration, lesions, rashes, or cyanosis. Skin warm and dry. Turgor intact.  PSYCHIATRIC: Mood, affect within normal limits. The patient is awake, alert and oriented x 3. Insight, judgment intact.  ALL OTHER ROS ARE NEGATIVE            ASSESSMENT / PLAN:  58 year old white female with signs and symptoms of severe sleep apnea With therapy her AHI is down to 3 Her compliance is not very good due to the fact that she has had  difficult time with the mask She has tried different masks at different times And she is miserable with the mask  She no longer wants to be treated with CPAP therapy  I have advised to seek alternative methods include dental devices from dentistry's in the surrounding area  Smoking cessation strongly advised  Follow-up as needed  Corrin Parker, M.D.  [facility]  Medical  Director [place] Director [facility]io-[facility] Department

## 2018-11-21 NOTE — Patient Instructions (Signed)
Recommend [facility] to assess for Oral Device

## 2018-11-22 DIAGNOSIS — M1990 Unspecified osteoarthritis, unspecified site: Secondary | ICD-10-CM

## 2018-11-22 MED ORDER — HYDROCODONE-ACETAMINOPHEN 10-325 MG PO TABS: 1.0000 | ORAL_TABLET | Freq: Four times a day (QID) | ORAL | 0 refills | Status: DC | PRN

## 2018-11-24 ENCOUNTER — Other Ambulatory Visit: Payer: Self-pay | Admitting: Primary Care

## 2018-11-24 ENCOUNTER — Ambulatory Visit: Payer: Self-pay

## 2018-11-24 ENCOUNTER — Other Ambulatory Visit (INDEPENDENT_AMBULATORY_CARE_PROVIDER_SITE_OTHER): Payer: PPO

## 2018-11-24 DIAGNOSIS — E559 Vitamin D deficiency, unspecified: Secondary | ICD-10-CM | POA: Diagnosis not present

## 2018-11-24 DIAGNOSIS — G35 Multiple sclerosis: Secondary | ICD-10-CM

## 2018-11-24 DIAGNOSIS — E785 Hyperlipidemia, unspecified: Secondary | ICD-10-CM

## 2018-11-24 DIAGNOSIS — R7303 Prediabetes: Secondary | ICD-10-CM

## 2018-11-24 DIAGNOSIS — E538 Deficiency of other specified B group vitamins: Secondary | ICD-10-CM

## 2018-11-24 DIAGNOSIS — Z0184 Encounter for antibody response examination: Secondary | ICD-10-CM

## 2018-11-24 LAB — COMPREHENSIVE METABOLIC PANEL
ALT: 9 U/L (ref 0–35)
AST: 10 U/L (ref 0–37)
Albumin: 4 g/dL (ref 3.5–5.2)
Alkaline Phosphatase: 64 U/L (ref 39–117)
BUN: 17 mg/dL (ref 6–23)
CO2: 28 mEq/L (ref 19–32)
Calcium: 9.4 mg/dL (ref 8.4–10.5)
Chloride: 102 mEq/L (ref 96–112)
Creatinine, Ser: 0.78 mg/dL (ref 0.40–1.20)
GFR: 81.02 mL/min (ref >60.00)
Glucose, Bld: 98 mg/dL (ref 70–99)
Potassium: 3.8 mEq/L (ref 3.5–5.1)
Sodium: 139 mEq/L (ref 135–145)
TOTAL PROTEIN: 6.3 g/dL (ref 6.0–8.3)
Total Bilirubin: 0.3 mg/dL (ref 0.2–1.2)

## 2018-11-24 LAB — HEMOGLOBIN A1C: Hgb A1c MFr Bld: 6 % (ref 4.6–6.5)

## 2018-11-24 LAB — LIPID PANEL
Cholesterol: 318 mg/dL — ABNORMAL HIGH (ref 0–200)
HDL: 63.6 mg/dL (ref >39.00)
NonHDL: 254.8
Total CHOL/HDL Ratio: 5
Triglycerides: 281 mg/dL — ABNORMAL HIGH (ref 0.0–149.0)
VLDL: 56.2 mg/dL — ABNORMAL HIGH (ref 0.0–40.0)

## 2018-11-24 LAB — TSH: TSH: 4.32 u[IU]/mL (ref 0.35–4.50)

## 2018-11-24 LAB — VITAMIN B12: Vitamin B-12: 197 pg/mL — ABNORMAL LOW (ref 211–911)

## 2018-11-24 LAB — LDL CHOLESTEROL, DIRECT: Direct LDL: 227 mg/dL

## 2018-11-24 LAB — VITAMIN D 25 HYDROXY (VIT D DEFICIENCY, FRACTURES): VITD: 76.1 ng/mL (ref 30.00–100.00)

## 2018-11-25 LAB — MEASLES/MUMPS/RUBELLA IMMUNITY
Mumps IgG: 86.2 AU/mL
Rubella: 2.09 index
Rubeola IgG: 23.2 AU/mL

## 2018-11-29 ENCOUNTER — Other Ambulatory Visit (HOSPITAL_COMMUNITY): Payer: Self-pay | Admitting: Psychiatry

## 2018-11-30 ENCOUNTER — Encounter: Payer: PPO | Admitting: Primary Care

## 2018-12-02 ENCOUNTER — Ambulatory Visit (INDEPENDENT_AMBULATORY_CARE_PROVIDER_SITE_OTHER): Payer: PPO | Admitting: Primary Care

## 2018-12-02 ENCOUNTER — Encounter: Payer: Self-pay | Admitting: Primary Care

## 2018-12-02 VITALS — BP 116/72 | HR 79 | Temp 98.4°F | Ht 69.0 in | Wt 195.0 lb

## 2018-12-02 DIAGNOSIS — Z1211 Encounter for screening for malignant neoplasm of colon: Secondary | ICD-10-CM | POA: Diagnosis not present

## 2018-12-02 DIAGNOSIS — K219 Gastro-esophageal reflux disease without esophagitis: Secondary | ICD-10-CM | POA: Diagnosis not present

## 2018-12-02 DIAGNOSIS — G35 Multiple sclerosis: Secondary | ICD-10-CM | POA: Diagnosis not present

## 2018-12-02 DIAGNOSIS — E538 Deficiency of other specified B group vitamins: Secondary | ICD-10-CM | POA: Diagnosis not present

## 2018-12-02 DIAGNOSIS — Z720 Tobacco use: Secondary | ICD-10-CM | POA: Diagnosis not present

## 2018-12-02 DIAGNOSIS — G894 Chronic pain syndrome: Secondary | ICD-10-CM

## 2018-12-02 DIAGNOSIS — E559 Vitamin D deficiency, unspecified: Secondary | ICD-10-CM | POA: Diagnosis not present

## 2018-12-02 DIAGNOSIS — G4733 Obstructive sleep apnea (adult) (pediatric): Secondary | ICD-10-CM | POA: Diagnosis not present

## 2018-12-02 DIAGNOSIS — N200 Calculus of kidney: Secondary | ICD-10-CM

## 2018-12-02 DIAGNOSIS — Z23 Encounter for immunization: Secondary | ICD-10-CM | POA: Diagnosis not present

## 2018-12-02 DIAGNOSIS — J3089 Other allergic rhinitis: Secondary | ICD-10-CM

## 2018-12-02 DIAGNOSIS — G4700 Insomnia, unspecified: Secondary | ICD-10-CM

## 2018-12-02 DIAGNOSIS — Z1239 Encounter for other screening for malignant neoplasm of breast: Secondary | ICD-10-CM

## 2018-12-02 DIAGNOSIS — R7303 Prediabetes: Secondary | ICD-10-CM | POA: Diagnosis not present

## 2018-12-02 DIAGNOSIS — Z0000 Encounter for general adult medical examination without abnormal findings: Secondary | ICD-10-CM

## 2018-12-02 DIAGNOSIS — E785 Hyperlipidemia, unspecified: Secondary | ICD-10-CM

## 2018-12-02 DIAGNOSIS — F3170 Bipolar disorder, currently in remission, most recent episode unspecified: Secondary | ICD-10-CM

## 2018-12-02 DIAGNOSIS — M1631 Unilateral osteoarthritis resulting from hip dysplasia, right hip: Secondary | ICD-10-CM

## 2018-12-02 DIAGNOSIS — M159 Polyosteoarthritis, unspecified: Secondary | ICD-10-CM

## 2018-12-02 MED ORDER — ZOSTER VAC RECOMB ADJUVANTED 50 MCG/0.5ML IM SUSR: 0.5000 mL | Freq: Once | INTRAMUSCULAR | 1 refills | Status: AC

## 2018-12-02 MED ORDER — CYANOCOBALAMIN 1000 MCG/ML IJ SOLN
1000.0000 ug | Freq: Once | INTRAMUSCULAR | Status: AC
  Administered 2018-12-02: 1000 ug via INTRAMUSCULAR

## 2018-12-02 NOTE — Assessment & Plan Note (Signed)
Following with psychiatry, continue current regimen. 

## 2018-12-02 NOTE — Assessment & Plan Note (Signed)
Above goal. Continues to refuse statin therapy and Zetia. Discussed long term effects of uncontrolled hyperlipidemia.  Strongly advised she work on a healthy diet and get regular exercise.  Continue to monitor.

## 2018-12-02 NOTE — Assessment & Plan Note (Signed)
Shingles Rx sent to pharmacy. Influenza vaccination UTD. Mammogram overdue, orders placed. Colonoscopy overdue, referral placaed. Strongly advised she work on her diet and get regular exercise. Exam unremarkable. Labs reviewed. Follow up in 1 year for CPE.

## 2018-12-02 NOTE — Assessment & Plan Note (Signed)
Improved and at goal. Continue current vitamin D drops.

## 2018-12-02 NOTE — Assessment & Plan Note (Signed)
Not smoking cigarettes, using vape.  Strongly advised she stop vapping.

## 2018-12-02 NOTE — Assessment & Plan Note (Signed)
Compliant to [place] as prescribed and is doing well. Agree that this is beneficial for patient and will continue to prescribe. No suspicious activity on PMP aware site. UDS is up to date. Follow up in 3 months.

## 2018-12-02 NOTE — Assessment & Plan Note (Signed)
A1C overall the same, advised she work on diet and being regular exercise. Continue to monitor.

## 2018-12-02 NOTE — Assessment & Plan Note (Signed)
Following with neurology through [facility]. Follow up visit due next month. Due for repeat MRI.

## 2018-12-02 NOTE — Assessment & Plan Note (Signed)
Using PPI intermittently with improvement, continue same.

## 2018-12-02 NOTE — Assessment & Plan Note (Signed)
Has had intermittent symptoms throughout last year, no recent hospital visits. Continue with Urology follow up.

## 2018-12-02 NOTE — Progress Notes (Signed)
Subjective:    Patient ID: Emily Moon, female    DOB: Nov 13, 1961, 58 y.o.   MRN: [id number]  HPI  Emily Moon is a 58 year old female who presents today for complete physical.   Immunizations: -Influenza: Completed this season  -Pneumonia: Completed last in 2016 -Shingles: Due   Diet: She endorses a fair diet Breakfast: Skips Lunch: Protein shakes Dinner: Take out (subs or pizza) Snacks: Chex mix, chocolate  Desserts: 1-2 times weekly  Beverages: Water, diet soda, protein shake  Exercise: She is not exercising  Eye exam: Completed in 2019 Dental exam: No recent exam Colonoscopy: Due Pap Smear: Completed in 2019 Mammogram: Due  The 10-year ASCVD risk score Mikey Bussing DC Jr., et al., 2013) is: 2.5%   Values used to calculate the score:     Age: 53 years     Sex: Female     Is Non-Hispanic African American: No     Diabetic: No     Tobacco smoker: No     Systolic Blood Pressure: 062 mmHg     Is BP treated: No     HDL Cholesterol: 63.6 mg/dL     Total Cholesterol: 318 mg/dL  Wt Readings from Last 3 Encounters:  12/02/18 195 lb (88.5 kg)  11/21/18 190 lb 9.6 oz (86.5 kg)  10/24/18 188 lb 4 oz (85.4 kg)     Review of Systems  Constitutional: Negative for unexpected weight change.  HENT: Negative for rhinorrhea.   Respiratory: Negative for cough and shortness of breath.   Cardiovascular: Negative for chest pain.  Gastrointestinal: Negative for constipation and diarrhea.  Genitourinary: Negative for difficulty urinating.  Musculoskeletal: Positive for arthralgias and myalgias.       Chronic  Skin: Negative for rash.  Allergic/Immunologic: Positive for environmental allergies.  Neurological: Negative for dizziness, numbness and headaches.  Psychiatric/Behavioral:       Following with psychiatry       Past Medical History:  Diagnosis Date  . Arthritis    osteo  . Asthma   . Bipolar disorder ([place]) 05/21/14  . Cataracts, bilateral   . DDD (degenerative  disc disease), cervical    also back  . Depression   . Dizziness    Positional  . Edema    feet/legs  . Fibromyalgia syndrome   . Fungal infection    Finger nails  . GERD (gastroesophageal reflux disease)   . Gout   . Headache    seasonal allergies  . Heart palpitations   . 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 Hypercholesterolemia   . Multiple sclerosis (HCC)    weakness  . Nephrolithiasis    kidney stones  . Osteoporosis    osteoarthritis  . Pneumonia   . PONV (postoperative nausea and vomiting)    no problem after cataract surgery  . Psoriasis   . Renal stone   . Shortness of breath dyspnea    wheezing  . Sleep apnea 2012   sleep study / slight, no interventions  . Urinary frequency      Social History   Socioeconomic History  . Marital status: Divorced    Spouse name: Not on file  . Number of children: 1  . Years of education: Not on file  . Highest education level: Not on file  Occupational History  . Occupation: Therapist, art Rep at [employer]  . Financial resource strain: Not on file  . Food insecurity:  Worry: Not on file    Inability: Not on file  . Transportation needs:    Medical: Not on file    Non-medical: Not on file  Tobacco Use  . Smoking status: Former Smoker    Years: 25.00    Types: Cigarettes    Last attempt to quit: 11/16/2012    Years since quitting: 6.0  . Smokeless tobacco: Never Used  . Tobacco comment: occasional use  Substance and Sexual Activity  . Alcohol use: No    Alcohol/week: 0.0 standard drinks  . Drug use: No    Types: Methylphenidate  . Sexual activity: Never  Lifestyle  . Physical activity:    Days per week: Not on file    Minutes per session: Not on file  . Stress: Not on file  Relationships  . Social connections:    Talks on phone: Not on file    Gets together: Not on file    Attends religious service: Not on file    Active member of club or organization: Not on  file    Attends meetings of clubs or organizations: Not on file    Relationship status: Not on file  . Intimate partner violence:    Fear of current or ex partner: Not on file    Emotionally abused: Not on file    Physically abused: Not on file    Forced sexual activity: Not on file  Other Topics Concern  . Not on file  Social History Narrative  . Not on file    Past Surgical History:  Procedure Laterality Date  . CATARACT EXTRACTION W/PHACO Left 05/21/2015   Procedure: CATARACT EXTRACTION PHACO AND INTRAOCULAR LENS PLACEMENT (IOC);  Surgeon: Birder Robson, MD;  Location: [facility] ORS;  Service: Ophthalmology;  Laterality: Left;  [place] 00:35 AP% 22.9 CDE 8.11 fluid pack lot #[id number]  . CATARACT EXTRACTION W/PHACO Right 06/04/2015   Procedure: CATARACT EXTRACTION PHACO AND INTRAOCULAR LENS PLACEMENT (IOC);  Surgeon: Birder Robson, MD;  Location: [facility] OR[facility];  Service: Ophthalmology;  Laterality: Right;  US:00:48 AP%: 10.5 CDE:5.08 Fluid lot #[id number]  . CYSTOSCOPY/URETEROSCOPY/HOLMIUM LASER/STENT PLACEMENT Bilateral 09/22/2016   Procedure: CYSTOSCOPY/URETEROSCOPY/HOLMIUM LASER/STENT PLACEMENT;  Surgeon: Hollice Espy, MD;  Location: [facility] ORS;  Service: Urology;  Laterality: Bilateral;  . EYE SURGERY  2015   tissue biopsy  . FOOT SURGERY  2015  . JOINT REPLACEMENT Left 2013   hip replacement  . LITHOTRIPSY    . PTOSIS REPAIR Bilateral 02/18/2016   Procedure: BILATERAL PTOSIS REPAIR UPPER EYELIDS;  Surgeon: Karle Starch, MD;  Location: [facility];  Service: Ophthalmology;  Laterality: Bilateral;  LEAVE PT EARLY AM  . thumb surgery Right   . TONSILLECTOMY  1973    Family History  Problem Relation Age of Onset  . Cancer Father        Abdomen with mastasis  . Cancer Mother   . Heart disease Mother   . Kidney disease Neg Hx   . Bladder Cancer Neg Hx   . Prostate cancer Neg Hx   . Kidney cancer Neg Hx     Allergies  Allergen Reactions  . Albuterol Shortness Of Breath  and Other (See Comments)    Makes pt feel jittery/ tacycardic  . Crestor [Rosuvastatin] Other (See Comments)    Joint pain, muscle pain, and hair loss  . Halcion [Triazolam] Other (See Comments)    Dizziness,headaches,bladder problems  . Levaquin [Levofloxacin In D5w] Diarrhea and Itching    Shoulder pain  . Naproxen Sodium  Swelling    Patient tolerates in small doses  . Tylenol [Acetaminophen] Swelling    Patient tolerates in small doses  . Cefaclor Other (See Comments)    Doesn't remember---unsure if actually allergic   . Diclofenac Sodium Other (See Comments)    "made very sick"  . Sulfa Antibiotics Itching    Unsure of reaction possibly itching  . Tramadol Itching and Nausea And Vomiting  . Aripiprazole Other (See Comments)    Muscle tension/cramping  . Ibuprofen Swelling    Patient tolerates in small doses    Current Outpatient Medications on File Prior to Visit  Medication Sig Dispense Refill  . ASPERCREME LIDOCAINE EX Apply 1 application topically 4 (four) times daily as needed (for pain.).     Marland Kitchen aspirin 325 MG tablet Take 325 mg by mouth every 4 (four) hours as needed for headache.     Marland Kitchen azelastine (ASTELIN) 0.1 % nasal spray PLACE 2 SPRAYS INTO BOTH NOSTRILS 2 (TWO) TIMES DAILY. 30 mL 0  . BIOTIN PO Take 100 mg by mouth daily.    . calcipotriene-betamethasone (TACLONEX) ointment Apply 1 application topically daily as needed (for psorasis).     . Cholecalciferol (VITAMIN D3) 10000 units capsule Take 30,000 Units by mouth daily.     . clobetasol cream (TEMOVATE) 6.27 % Apply 1 application topically 2 (two) times daily. 30 g 2  . cyanocobalamin (,VITAMIN B-12,) 1000 MCG/ML injection Inject 1 mL (1,000 mcg total) into the muscle every 3 (three) months. 1 mL 0  . diclofenac sodium (VOLTAREN) 1 % GEL Apply 4 g topically 4 (four) times daily. 500 g 5  . docusate sodium (COLACE) 100 MG capsule Take 1 capsule (100 mg total) by mouth 2 (two) times daily. 60 capsule 0  . DULoxetine  (CYMBALTA) 20 MG capsule Take 2 capsules (40 mg total) by mouth at bedtime. 180 capsule 3  . HYDROcodone-acetaminophen (NORCO) 10-325 MG tablet Take 1 tablet by mouth every 6 (six) hours as needed for moderate pain or severe pain. Supervising physician; Dr Eliezer Lofts; DEA# [id number] 120 tablet 0  . hydroquinone 4 % cream 1 APPLICATION TOPICALLY DAILY AS NEEDED FOR BLEMISHES.  3  . hydrOXYzine (VISTARIL) 50 MG capsule 1   q  Noon  1  q 6:00 pm  1  prn 90 capsule 5  . ketoconazole (NIZORAL) 2 % cream Apply 1 application topically daily. 30 g 0  . lamoTRIgine (LAMICTAL) 150 MG tablet Take 2 tablets (300 mg total) by mouth at bedtime. 60 tablet 5  . levalbuterol (XOPENEX HFA) 45 MCG/ACT inhaler Inhale 1-2 puffs into the lungs every 6 (six) hours as needed for wheezing. 1 Inhaler 0  . Lysine 1000 MG TABS Take 2,000-4,000 mg by mouth at bedtime. 2000 mg scheduled at bedtime and patient will take 4000 mg if she has outbreak    . Magnesium 500 MG TABS Take 1 tablet by mouth at bedtime.    Marland Kitchen neomycin-bacitracin-polymyxin (NEOSPORIN) [id number] ointment Apply 1 application topically 4 (four) times daily as needed (for cut/scrapes.).    Marland Kitchen nicotine (NICODERM CQ - DOSED IN MG/24 HOURS) 21 mg/24hr patch Place 1 patch (21 mg total) onto the skin daily. 28 patch 3  . phenazopyridine (AZO-TABS) 95 MG tablet Take 95 mg by mouth 3 (three) times daily as needed for pain.    . polyethylene glycol powder (GLYCOLAX/MIRALAX) powder MIX 17 GRAMS (1 CAPFUL) WITH 4-8 OZ OF LIQUID AND TAKE BY MOUTH TWICE DAILY AS NEEDED 527 g  0  . tamsulosin (FLOMAX) 0.4 MG CAPS capsule Take 1 capsule (0.4 mg total) by mouth daily. 30 capsule 11  . temazepam (RESTORIL) 30 MG capsule TAKE ONE TO TWO CAPSULES BY MOUTH NIGHTLY AT BEDTIME 60 capsule 4  . tiZANidine (ZANAFLEX) 4 MG tablet TAKE 2 TABLETS (8 MG TOTAL) BY MOUTH EVERY 6 (SIX) HOURS AS NEEDED FOR MUSCLE SPASMS. 720 tablet 0  . traZODone (DESYREL) 50 MG tablet Take 2 tablets (100 mg  total) by mouth at bedtime. 60 tablet 7  . zolpidem (AMBIEN) 10 MG tablet Take 1 tablet (10 mg total) by mouth at bedtime. 30 tablet 5   No current facility-administered medications on file prior to visit.     BP 116/72   Pulse 79   Temp 98.4 F (36.9 C) (Oral)   Ht 5\' 9"  (1.753 m)   Wt 195 lb (88.5 kg)   LMP 08/23/2014   SpO2 98%   BMI 28.80 kg/m    Objective:   Physical Exam  Constitutional: She is oriented to person, place, and time. She appears well-nourished.  HENT:  Mouth/Throat: No oropharyngeal exudate.  Eyes: Pupils are equal, round, and reactive to light. EOM are normal.  Neck: Neck supple. No thyromegaly present.  Cardiovascular: Normal rate and regular rhythm.  Respiratory: Effort normal and breath sounds normal.  GI: Soft. Bowel sounds are normal. There is no abdominal tenderness.  Musculoskeletal:     Comments: Chronic decrease in ROM to right hip and bilateral knees.  Neurological: She is alert and oriented to person, place, and time.  Skin: Skin is warm and dry.  Psychiatric: She has a normal mood and affect.           Assessment & Plan:

## 2018-12-02 NOTE — Assessment & Plan Note (Signed)
Compliant to [place] as prescribed and is doing well. Agree that this is beneficial for patient and will continue to prescribe. No suspicious activity on PMP aware site. UDS is up to date. Follow up in 3 months.

## 2018-12-02 NOTE — Assessment & Plan Note (Signed)
Following with psychiatry, no recent changes.  Due next month for follow up.

## 2018-12-02 NOTE — Assessment & Plan Note (Signed)
Compliant to [place] as prescribed and is doing well. Agree that this is beneficial for patient and will continue to prescribe. No suspicious activity on PMP aware site. UDS is up to date. Follow up in 3 months.

## 2018-12-02 NOTE — Assessment & Plan Note (Signed)
Could not tolerate CPAP machine, following with pulmonology. Will be looking into a dental appliance.

## 2018-12-02 NOTE — Assessment & Plan Note (Signed)
Overall stable on current regimen. Continue same.

## 2018-12-02 NOTE — Patient Instructions (Signed)
Emily Moon in [place] for mouth appliance  Start exercising. You should be getting 150 minutes of moderate intensity exercise weekly.  It's important to improve your diet by reducing consumption of fast food, fried food, processed snack foods, sugary drinks. Increase consumption of fresh vegetables and fruits, whole grains, water.  Ensure you are drinking 64 ounces of water daily.  Go to the pharmacy for the Shingles vaccination.  Call the [facility] for your mammogram.  You will be contacted regarding your referral to GI for the colonoscopy.  Please let us know if you have not been contacted within one week.   Schedule a follow up visit for three months for your narcotic visit.  It was a pleasure to see you today!   Preventive Care 40-64 Years, Female Preventive care refers to lifestyle choices and visits with your health care provider that can promote health and wellness. What does preventive care include?   A yearly physical exam. This is also called an annual well check.  Dental exams once or twice a year.  Routine eye exams. Ask your health care provider how often you should have your eyes checked.  Personal lifestyle choices, including: ? Daily care of your teeth and gums. ? Regular physical activity. ? Eating a healthy diet. ? Avoiding tobacco and drug use. ? Limiting alcohol use. ? Practicing safe sex. ? Taking low-dose aspirin daily starting at age 4. ? Taking vitamin and mineral supplements as recommended by your health care provider. What happens during an annual well check? The services and screenings done by your health care provider during your annual well check will depend on your age, overall health, lifestyle risk factors, and family history of disease. Counseling Your health care provider may ask you questions about your:  Alcohol use.  Tobacco use.  Drug use.  Emotional well-being.  Home and relationship well-being.  Sexual  activity.  Eating habits.  Work and work Statistician.  Method of birth control.  Menstrual cycle.  Pregnancy history. Screening You may have the following tests or measurements:  Height, weight, and BMI.  Blood pressure.  Lipid and cholesterol levels. These may be checked every 5 years, or more frequently if you are over 2 years old.  Skin check.  Lung cancer screening. You may have this screening every year starting at age 55 if you have a 30-pack-year history of smoking and currently smoke or have quit within the past 15 years.  Colorectal cancer screening. All adults should have this screening starting at age 10 and continuing until age 22. Your health care provider may recommend screening at age 68. You will have tests every 1-10 years, depending on your results and the type of screening test. People at increased risk should start screening at an earlier age. Screening tests may include: ? Guaiac-based fecal occult blood testing. ? Fecal immunochemical test (FIT). ? Stool DNA test. ? Virtual colonoscopy. ? Sigmoidoscopy. During this test, a flexible tube with a tiny camera (sigmoidoscope) is used to examine your rectum and lower colon. The sigmoidoscope is inserted through your anus into your rectum and lower colon. ? Colonoscopy. During this test, a long, thin, flexible tube with a tiny camera (colonoscope) is used to examine your entire colon and rectum.  Hepatitis C blood test.  Hepatitis B blood test.  Sexually transmitted disease (STD) testing.  Diabetes screening. This is done by checking your blood sugar (glucose) after you have not eaten for a while (fasting). You may have this done  every 1-3 years.  Mammogram. This may be done every 1-2 years. Talk to your health care provider about when you should start having regular mammograms. This may depend on whether you have a family history of breast cancer.  BRCA-related cancer screening. This may be done if you have  a family history of breast, ovarian, tubal, or peritoneal cancers.  Pelvic exam and Pap test. This may be done every 3 years starting at age 53. Starting at age 28, this may be done every 5 years if you have a Pap test in combination with an HPV test.  Bone density scan. This is done to screen for osteoporosis. You may have this scan if you are at high risk for osteoporosis. Discuss your test results, treatment options, and if necessary, the need for more tests with your health care provider. Vaccines Your health care provider may recommend certain vaccines, such as:  Influenza vaccine. This is recommended every year.  Tetanus, diphtheria, and acellular pertussis (Tdap, Td) vaccine. You may need a Td booster every 10 years.  Varicella vaccine. You may need this if you have not been vaccinated.  Zoster vaccine. You may need this after age 16.  Measles, mumps, and rubella (MMR) vaccine. You may need at least one dose of MMR if you were born in 1957 or later. You may also need a second dose.  Pneumococcal 13-valent conjugate (PCV13) vaccine. You may need this if you have certain conditions and were not previously vaccinated.  Pneumococcal polysaccharide (PPSV23) vaccine. You may need one or two doses if you smoke cigarettes or if you have certain conditions.  Meningococcal vaccine. You may need this if you have certain conditions.  Hepatitis A vaccine. You may need this if you have certain conditions or if you travel or work in places where you may be exposed to hepatitis A.  Hepatitis B vaccine. You may need this if you have certain conditions or if you travel or work in places where you may be exposed to hepatitis B.  Haemophilus influenzae type b (Hib) vaccine. You may need this if you have certain conditions. Talk to your health care provider about which screenings and vaccines you need and how often you need them. This information is not intended to replace advice given to you by  your health care provider. Make sure you discuss any questions you have with your health care provider. Document Released: 11/29/2015 Document Revised: 12/23/2017 Document Reviewed: 09/03/2015 Elsevier Interactive Patient Education  2019 [employer].

## 2018-12-02 NOTE — Addendum Note (Signed)
Addended by: Jacqualin Combes on: 12/02/2018 09:05 AM   Modules accepted: Orders

## 2018-12-02 NOTE — Assessment & Plan Note (Addendum)
Recent level low, she is not coming monthly for B12 injections. Strongly advised she come for monthly B12 injections x 3 months, then re-evaluate. She agrees to B12 injection today but is now refusing future injections. Also discussed oral B12 if she is refusing injections, she does not wish to take as they "don't work". B12 injection provided today.

## 2018-12-13 ENCOUNTER — Other Ambulatory Visit: Payer: Self-pay | Admitting: Primary Care

## 2018-12-13 DIAGNOSIS — J3089 Other allergic rhinitis: Secondary | ICD-10-CM

## 2018-12-13 NOTE — Telephone Encounter (Signed)
Last prescribed on 11/15/2018. Last office visit on 12/02/2018. No future appointment

## 2018-12-14 NOTE — Telephone Encounter (Signed)
Noted.  Refill sent to pharmacy. 

## 2018-12-16 NOTE — Telephone Encounter (Signed)
Spoke with patient via phone about concerns. Recommended she come in for an office visit for further evaluation. Raquel Sarna, will you please call this patient for an appointment?

## 2018-12-21 ENCOUNTER — Ambulatory Visit (INDEPENDENT_AMBULATORY_CARE_PROVIDER_SITE_OTHER): Payer: PPO | Admitting: Primary Care

## 2018-12-21 ENCOUNTER — Ambulatory Visit (INDEPENDENT_AMBULATORY_CARE_PROVIDER_SITE_OTHER)
Admission: RE | Admit: 2018-12-21 | Discharge: 2018-12-21 | Disposition: A | Discharge status: Home / Self Care | Payer: PPO | Source: Ambulatory Visit | Attending: Primary Care | Admitting: Primary Care

## 2018-12-21 ENCOUNTER — Encounter: Payer: Self-pay | Admitting: Primary Care

## 2018-12-21 VITALS — BP 124/72 | HR 100 | Temp 98.1°F | Ht 69.0 in | Wt 191.2 lb

## 2018-12-21 DIAGNOSIS — R05 Cough: Secondary | ICD-10-CM | POA: Diagnosis not present

## 2018-12-21 DIAGNOSIS — K219 Gastro-esophageal reflux disease without esophagitis: Secondary | ICD-10-CM

## 2018-12-21 DIAGNOSIS — R0602 Shortness of breath: Secondary | ICD-10-CM | POA: Diagnosis not present

## 2018-12-21 DIAGNOSIS — R222 Localized swelling, mass and lump, trunk: Secondary | ICD-10-CM

## 2018-12-21 DIAGNOSIS — M1990 Unspecified osteoarthritis, unspecified site: Secondary | ICD-10-CM | POA: Diagnosis not present

## 2018-12-21 DIAGNOSIS — R1013 Epigastric pain: Secondary | ICD-10-CM | POA: Insufficient documentation

## 2018-12-21 IMAGING — DX DG CHEST 2V
2 series · 2 of 2 positions shown · non-contrast
Comparison: [DATE]

CLINICAL DATA: Cough, shortness of breath

EXAM:
CHEST - 2 VIEW

[chest pa]
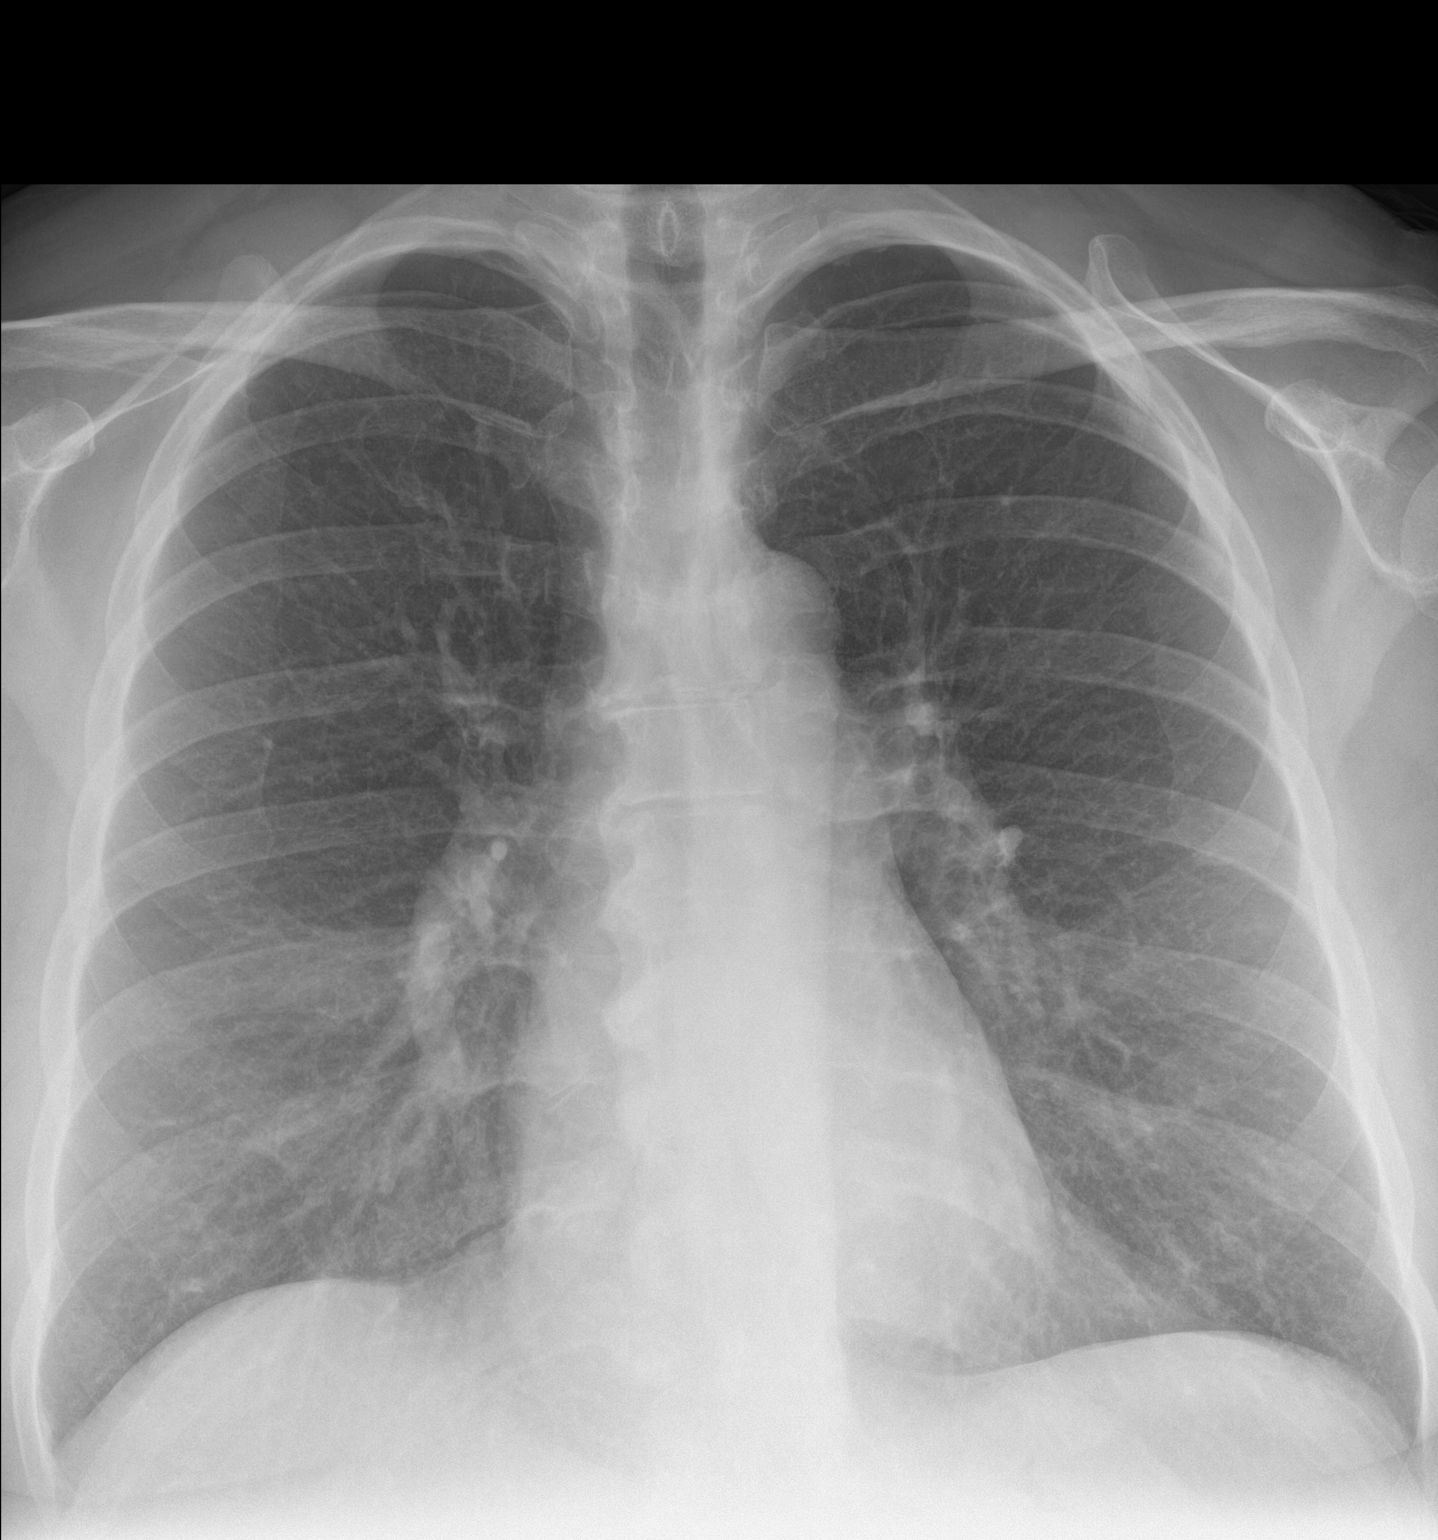

[chest lat]
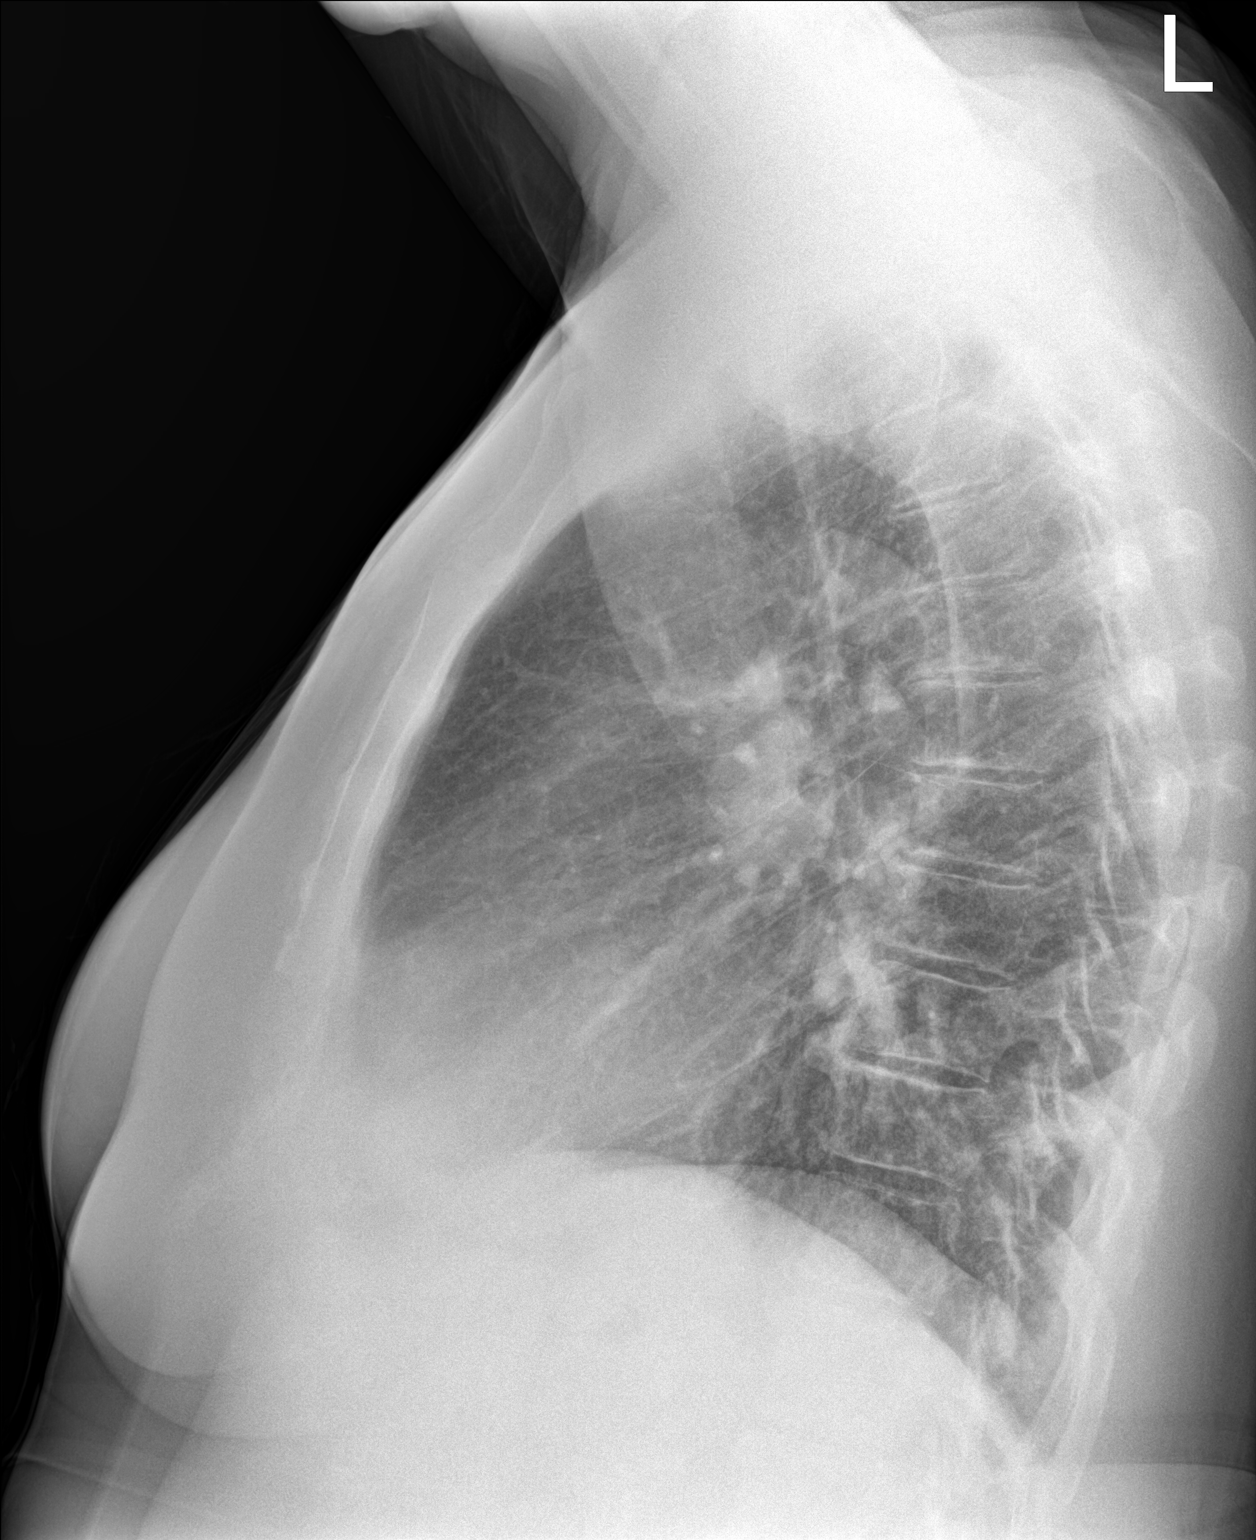

[2 of 2 positions shown; findings below may reference images not displayed]

FINDINGS: The heart size and mediastinal contours are within normal limits.
Both lungs are clear. The visualized skeletal structures are
unremarkable.
IMPRESSION: No acute abnormality of the lungs.  No focal airspace opacity.

## 2018-12-21 MED ORDER — HYDROCODONE-ACETAMINOPHEN 10-325 MG PO TABS: 1.0000 | ORAL_TABLET | Freq: Four times a day (QID) | ORAL | 0 refills | Status: DC | PRN

## 2018-12-21 NOTE — Assessment & Plan Note (Signed)
Could be secondary to GERD.  Also question hiatal hernia given "popping" sensation. No obvious hernia to the epigastric region felt on exam. Start pantoprazole 40 mg daily, she will update. We will check chest x-ray given bony protrusions to right side.

## 2018-12-21 NOTE — Patient Instructions (Addendum)
Start taking the pantoprazole 40 mg once daily, everyday for heartburn.  Complete xray(s) prior to leaving today. I will notify you of your results once received.  Please schedule your colonoscopy as discussed. Discuss your symptoms with the GI doctor.  It was a pleasure to see you today!

## 2018-12-21 NOTE — Assessment & Plan Note (Signed)
More persistent.  We will have her start on pantoprazole 40 mg daily for at least 4 to 6 weeks.  Plan is to reduce dose after that to 20 mg if stable.

## 2018-12-21 NOTE — Progress Notes (Signed)
Subjective:    Patient ID: Emily Moon, female    DOB: October 19, 1961, 58 y.o.   MRN: [id number]  HPI  Emily Moon is a 58 year old female with a history of GERD, multiple sclerosis, chronic pain syndrome, osteoarthritis, bipolar disorder who presents today with a chief complaint of abdominal discomfort. She is also due for a refill of her Norco.  She's noticed generalized abdominal pain/tightness and a "popping" sensation to the epigastric region just below the "breastbone". She will experience pain to the epigastric and mid abdomen about 1.5 hours after eating. She describes her symptoms as intermittent, burning, and bloating. Clothing around her mid abdomen causes discomfort.  She is taking pantoprazole 40 mg intermittently (her sisters old prescription) before a meal for the last one month. She has noticed some improvement. She has been eating less fast food, some pizza/wings/chicken nuggets. Also yogurt, cheese, milk.   On Sunday January 26th she was eating at her sisters house, started to notice "churning" to her epigastric region. Several hours later she developed severe pain. She took pantoprazole 40 mg, gas-x without improvement. She then took a THC mint which helped reduce symptoms. She then took [employer] with improvement. Eventually hours later she had resolve after she rubbed/massaged her abdomen repetitively. Since that episode she's noticed feeling a "popping sensation to the epigastric region when bending forward rising to a sitting position. The popping sensation doesn't cause pain.   She is having bowel movements daily, is taking Miralax off and on. She is due for colonoscopy and is working to get this scheduled. She denies bloody stools, unexplained weight loss, increased fatigue.  She has never had an endoscopy and is not sure if she has a hiatal hernia.  Wt Readings from Last 3 Encounters:  12/21/18 191 lb 4 oz (86.8 kg)  12/02/18 195 lb (88.5 kg)  11/21/18 190 lb 9.6  oz (86.5 kg)     Review of Systems  Constitutional: Negative for fever.  Gastrointestinal: Positive for abdominal pain. Negative for blood in stool, nausea and vomiting.       Esophageal burning       Past Medical History:  Diagnosis Date  . Arthritis    osteo  . Asthma   . Bipolar disorder ([place]) 05/21/14  . Cataracts, bilateral   . DDD (degenerative disc disease), cervical    also back  . Depression   . Dizziness    Positional  . Edema    feet/legs  . Fibromyalgia syndrome   . Fungal infection    Finger nails  . GERD (gastroesophageal reflux disease)   . Gout   . Headache    seasonal allergies  . Heart palpitations   . 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 Hypercholesterolemia   . Multiple sclerosis ([facility])    weakness  . Nephrolithiasis    kidney stones  . Osteoporosis    osteoarthritis  . Pneumonia   . PONV (postoperative nausea and vomiting)    no problem after cataract surgery  . Psoriasis   . Renal stone   . Shortness of breath dyspnea    wheezing  . Sleep apnea 2012   sleep study / slight, no interventions  . Urinary frequency      Social History   Socioeconomic History  . Marital status: Divorced    Spouse name: Not on file  . Number of children: 1  . Years of education: Not on file  . Highest education level: Not on file  Occupational History  . Occupation: Therapist, art Rep at [employer]  . Financial resource strain: Not on file  . Food insecurity:    Worry: Not on file    Inability: Not on file  . Transportation needs:    Medical: Not on file    Non-medical: Not on file  Tobacco Use  . Smoking status: Former Smoker    Years: 25.00    Types: Cigarettes    Last attempt to quit: 11/16/2012    Years since quitting: 6.0  . Smokeless tobacco: Never Used  . Tobacco comment: occasional use  Substance and Sexual Activity  . Alcohol use: No    Alcohol/week: 0.0 standard drinks  . Drug use: No     Types: Methylphenidate  . Sexual activity: Never  Lifestyle  . Physical activity:    Days per week: Not on file    Minutes per session: Not on file  . Stress: Not on file  Relationships  . Social connections:    Talks on phone: Not on file    Gets together: Not on file    Attends religious service: Not on file    Active member of club or organization: Not on file    Attends meetings of clubs or organizations: Not on file    Relationship status: Not on file  . Intimate partner violence:    Fear of current or ex partner: Not on file    Emotionally abused: Not on file    Physically abused: Not on file    Forced sexual activity: Not on file  Other Topics Concern  . Not on file  Social History Narrative  . Not on file    Past Surgical History:  Procedure Laterality Date  . CATARACT EXTRACTION W/PHACO Left 05/21/2015   Procedure: CATARACT EXTRACTION PHACO AND INTRAOCULAR LENS PLACEMENT (IOC);  Surgeon: Birder Robson, MD;  Location: [facility];  Service: Ophthalmology;  Laterality: Left;  [place] 00:35 AP% 22.9 CDE 8.11 fluid pack lot #[id number]  . CATARACT EXTRACTION W/PHACO Right 06/04/2015   Procedure: CATARACT EXTRACTION PHACO AND INTRAOCULAR LENS PLACEMENT (IOC);  Surgeon: Birder Robson, MD;  Location: [facility];  Service: Ophthalmology;  Laterality: Right;  US:00:48 AP%: 10.5 CDE:5.08 Fluid lot #[id number]  . CYSTOSCOPY/URETEROSCOPY/HOLMIUM LASER/STENT PLACEMENT Bilateral 09/22/2016   Procedure: CYSTOSCOPY/URETEROSCOPY/HOLMIUM LASER/STENT PLACEMENT;  Surgeon: Hollice Espy, MD;  Location: [facility];  Service: Urology;  Laterality: Bilateral;  . EYE SURGERY  2015   tissue biopsy  . FOOT SURGERY  2015  . JOINT REPLACEMENT Left 2013   hip replacement  . LITHOTRIPSY    . PTOSIS REPAIR Bilateral 02/18/2016   Procedure: BILATERAL PTOSIS REPAIR UPPER EYELIDS;  Surgeon: Karle Starch, MD;  Location: [facility];  Service: Ophthalmology;  Laterality: Bilateral;  LEAVE PT EARLY AM  .  thumb surgery Right   . TONSILLECTOMY  1973    Family History  Problem Relation Age of Onset  . Cancer Father        Abdomen with mastasis  . Cancer Mother   . Heart disease Mother   . Kidney disease Neg Hx   . Bladder Cancer Neg Hx   . Prostate cancer Neg Hx   . Kidney cancer Neg Hx     Allergies  Allergen Reactions  . Albuterol Shortness Of Breath and Other (See Comments)    Makes pt feel jittery/ tacycardic  . Crestor [Rosuvastatin] Other (See Comments)    Joint pain, muscle  pain, and hair loss  . Halcion [Triazolam] Other (See Comments)    Dizziness,headaches,bladder problems  . Levaquin [Levofloxacin In D5w] Diarrhea and Itching    Shoulder pain  . Naproxen Sodium Swelling    Patient tolerates in small doses  . Tylenol [Acetaminophen] Swelling    Patient tolerates in small doses  . Cefaclor Other (See Comments)    Doesn't remember---unsure if actually allergic   . Diclofenac Sodium Other (See Comments)    "made very sick"  . Sulfa Antibiotics Itching    Unsure of reaction possibly itching  . Tramadol Itching and Nausea And Vomiting  . Aripiprazole Other (See Comments)    Muscle tension/cramping  . Ibuprofen Swelling    Patient tolerates in small doses    Current Outpatient Medications on File Prior to Visit  Medication Sig Dispense Refill  . ASPERCREME LIDOCAINE EX Apply 1 application topically 4 (four) times daily as needed (for pain.).     Marland Kitchen aspirin 325 MG tablet Take 325 mg by mouth every 4 (four) hours as needed for headache.     Marland Kitchen azelastine (ASTELIN) 0.1 % nasal spray PLACE 2 SPRAYS INTO BOTH NOSTRILS 2 (TWO) TIMES DAILY. 30 mL 0  . BIOTIN PO Take 200 mg by mouth daily.     . calcipotriene-betamethasone (TACLONEX) ointment Apply 1 application topically daily as needed (for psorasis).     . Cholecalciferol (VITAMIN D3) 10000 units capsule Take 30,000 Units by mouth daily.     . clobetasol cream (TEMOVATE) 2.77 % Apply 1 application topically 2 (two) times  daily. 30 g 2  . cyanocobalamin (,VITAMIN B-12,) 1000 MCG/ML injection Inject 1 mL (1,000 mcg total) into the muscle every 3 (three) months. 1 mL 0  . diclofenac sodium (VOLTAREN) 1 % GEL Apply 4 g topically 4 (four) times daily. 500 g 5  . docusate sodium (COLACE) 100 MG capsule Take 1 capsule (100 mg total) by mouth 2 (two) times daily. 60 capsule 0  . DULoxetine (CYMBALTA) 20 MG capsule Take 2 capsules (40 mg total) by mouth at bedtime. 180 capsule 3  . hydroquinone 4 % cream 1 APPLICATION TOPICALLY DAILY AS NEEDED FOR BLEMISHES.  3  . hydrOXYzine (VISTARIL) 50 MG capsule 1   q  Noon  1  q 6:00 pm  1  prn 90 capsule 5  . ketoconazole (NIZORAL) 2 % cream Apply 1 application topically daily. 30 g 0  . lamoTRIgine (LAMICTAL) 150 MG tablet Take 2 tablets (300 mg total) by mouth at bedtime. 60 tablet 5  . levalbuterol (XOPENEX HFA) 45 MCG/ACT inhaler Inhale 1-2 puffs into the lungs every 6 (six) hours as needed for wheezing. 1 Inhaler 0  . Lysine 1000 MG TABS Take 2,000-4,000 mg by mouth at bedtime. 2000 mg scheduled at bedtime and patient will take 4000 mg if she has outbreak    . Magnesium 500 MG TABS Take 1 tablet by mouth at bedtime.    Marland Kitchen neomycin-bacitracin-polymyxin (NEOSPORIN) [phone] ointment Apply 1 application topically 4 (four) times daily as needed (for cut/scrapes.).    Marland Kitchen nicotine (NICODERM CQ - DOSED IN MG/24 HOURS) 21 mg/24hr patch Place 1 patch (21 mg total) onto the skin daily. 28 patch 3  . pantoprazole (PROTONIX) 40 MG tablet Take 40 mg by mouth daily.    . phenazopyridine (AZO-TABS) 95 MG tablet Take 95 mg by mouth 3 (three) times daily as needed for pain.    . polyethylene glycol powder (GLYCOLAX/MIRALAX) powder MIX 17 GRAMS (1  CAPFUL) WITH 4-8 OZ OF LIQUID AND TAKE BY MOUTH TWICE DAILY AS NEEDED 527 g 0  . tamsulosin (FLOMAX) 0.4 MG CAPS capsule Take 1 capsule (0.4 mg total) by mouth daily. 30 capsule 11  . temazepam (RESTORIL) 30 MG capsule TAKE ONE TO TWO CAPSULES BY MOUTH  NIGHTLY AT BEDTIME 60 capsule 4  . tiZANidine (ZANAFLEX) 4 MG tablet TAKE 2 TABLETS (8 MG TOTAL) BY MOUTH EVERY 6 (SIX) HOURS AS NEEDED FOR MUSCLE SPASMS. 720 tablet 0  . traZODone (DESYREL) 50 MG tablet Take 2 tablets (100 mg total) by mouth at bedtime. 60 tablet 7  . zolpidem (AMBIEN) 10 MG tablet Take 1 tablet (10 mg total) by mouth at bedtime. 30 tablet 5   No current facility-administered medications on file prior to visit.     BP 124/72   Pulse 100   Temp 98.1 F (36.7 C) (Oral)   Ht 5\' 9"  (1.753 m)   Wt 191 lb 4 oz (86.8 kg)   LMP 08/23/2014   SpO2 97%   BMI 28.24 kg/m    Objective:   Physical Exam  Constitutional: She appears well-nourished.  Neck: Neck supple.  Cardiovascular: Normal rate and regular rhythm.  Respiratory: Effort normal and breath sounds normal.    Bony appearing protrudent to right anterior chest distal to xiphoid process.  Nontender.  GI: Soft. Normal appearance and bowel sounds are normal. There is no abdominal tenderness.  Slight ventral hernia noted to mid to lower abdomen  Skin: Skin is warm and dry.           Assessment & Plan:

## 2018-12-26 ENCOUNTER — Encounter: Payer: Self-pay | Admitting: *Deleted

## 2018-12-30 ENCOUNTER — Ambulatory Visit (HOSPITAL_COMMUNITY): Payer: PPO | Admitting: Psychiatry

## 2018-12-30 ENCOUNTER — Encounter (HOSPITAL_COMMUNITY): Payer: Self-pay | Admitting: Psychiatry

## 2018-12-30 VITALS — BP 128/74 | HR 80 | Ht 69.0 in | Wt 200.4 lb

## 2018-12-30 DIAGNOSIS — G4709 Other insomnia: Secondary | ICD-10-CM

## 2018-12-30 DIAGNOSIS — F3289 Other specified depressive episodes: Secondary | ICD-10-CM | POA: Diagnosis not present

## 2018-12-30 DIAGNOSIS — F3110 Bipolar disorder, current episode manic without psychotic features, unspecified: Secondary | ICD-10-CM | POA: Diagnosis not present

## 2018-12-30 MED ORDER — ZOLPIDEM TARTRATE 10 MG PO TABS: 10.0000 mg | ORAL_TABLET | Freq: Every day | ORAL | 5 refills | Status: DC

## 2018-12-30 MED ORDER — HYDROXYZINE PAMOATE 50 MG PO CAPS: ORAL_CAPSULE | ORAL | 5 refills | Status: DC

## 2018-12-30 MED ORDER — DULOXETINE HCL 20 MG PO CPEP: 40.0000 mg | ORAL_CAPSULE | Freq: Every day | ORAL | 3 refills | Status: DC

## 2018-12-30 MED ORDER — TEMAZEPAM 30 MG PO CAPS: ORAL_CAPSULE | ORAL | 4 refills | Status: DC

## 2018-12-30 MED ORDER — LAMOTRIGINE 150 MG PO TABS: 300.0000 mg | ORAL_TABLET | Freq: Every day | ORAL | 5 refills | Status: DC

## 2018-12-30 MED ORDER — TRAZODONE HCL 50 MG PO TABS: 100.0000 mg | ORAL_TABLET | Freq: Every day | ORAL | 7 refills | Status: DC

## 2018-12-30 NOTE — Progress Notes (Signed)
Psychiatric Initial Adult Assessment   Patient Identification: Emily Moon MRN:  [id number] Date of Evaluation:  12/30/2018 Referral Source: From the community Chief Complaint: Feels exhausted Visit Diagno bipolar disorder command  Today the patient is doing fairly well.  She is at her baseline.  The patient did hurt her finger but that seems to be getting better.  Generally her health is stable.  Her MS seems to be well controlled.  She still takes biotin.  Financially she says she is in a lot of distress.  The patient has only one good friend.  She says she feels like she is lonely and poor.  She is having trouble with her sleep apnea and getting a CPAP that works for her.  Her appetite is good.  She still has fairly good energy.  The patient denies any psychosis.  She denies being irritable or euphoric.  She denies racing thinking.  She does nothing dangerous or risky.  She is not distractible.  She is actually calmer and more organized today.  The patient lives alone and seems to be stable.  She likes where she lives.    Patient: Emily Moon  Procedure(s) Performed: * No surgery found *  Anesthes  Patient location:   Post pain:   Post assessment:   Last Vitals:  Vitals:   12/30/18 1058  BP: 128/74  Pulse: 80    Post vital signs:   Level of consciousness:   Complications: . Associated Signs/Symptoms: Depression Symptoms:  hopelessness, (Hypo) Manic Symptoms:   Anxiety Symptoms:   Psychotic Symptoms:   PTSD Symptoms:   Past Psychiatric History: The patient was seen in a psychiatric office and takes multiple medications she's not been recently hospitalized.  Previous Psychotropic Medications: Yes   Substance Abuse History in the last 12 months:  No.  Consequences of Substance Abuse: Negative  Past Medical History:  Past Medical History:  Diagnosis Date  . Arthritis    osteo  . Asthma   . Bipolar disorder ([place]) 05/21/14  . Cataracts, bilateral    . DDD (degenerative disc disease), cervical    also back  . Depression   . Dizziness    Positional  . Edema    feet/legs  . Fibromyalgia syndrome   . Fungal infection    Finger nails  . GERD (gastroesophageal reflux disease)   . Gout   . Headache    seasonal allergies  . Heart palpitations   . 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 Hypercholesterolemia   . Multiple sclerosis (HCC)    weakness  . Nephrolithiasis    kidney stones  . Osteoporosis    osteoarthritis  . Pneumonia   . PONV (postoperative nausea and vomiting)    no problem after cataract surgery  . Psoriasis   . Renal stone   . Shortness of breath dyspnea    wheezing  . Sleep apnea 2012   sleep study / slight, no interventions  . Urinary frequency     Past Surgical History:  Procedure Laterality Date  . CATARACT EXTRACTION W/PHACO Left 05/21/2015   Procedure: CATARACT EXTRACTION PHACO AND INTRAOCULAR LENS PLACEMENT (IOC);  Surgeon: Birder Robson, MD;  Location: [facility];  Service: Ophthalmology;  Laterality: Left;  [place] 00:35 AP% 22.9 CDE 8.11 fluid pack lot #[id number]  . CATARACT EXTRACTION W/PHACO Right 06/04/2015   Procedure: CATARACT EXTRACTION PHACO AND INTRAOCULAR LENS PLACEMENT (IOC);  Surgeon: Birder Robson, MD;  Location: [facility];  Service: Ophthalmology;  Laterality: Right;  US:00:48  AP%: 10.5 CDE:5.08 Fluid lot #[id number]  . CYSTOSCOPY/URETEROSCOPY/HOLMIUM LASER/STENT PLACEMENT Bilateral 09/22/2016   Procedure: CYSTOSCOPY/URETEROSCOPY/HOLMIUM LASER/STENT PLACEMENT;  Surgeon: Hollice Espy, MD;  Location: [facility];  Service: Urology;  Laterality: Bilateral;  . EYE SURGERY  2015   tissue biopsy  . FOOT SURGERY  2015  . JOINT REPLACEMENT Left 2013   hip replacement  . LITHOTRIPSY    . PTOSIS REPAIR Bilateral 02/18/2016   Procedure: BILATERAL PTOSIS REPAIR UPPER EYELIDS;  Surgeon: Karle Starch, MD;  Location: [facility];  Service: Ophthalmology;  Laterality: Bilateral;  LEAVE PT EARLY  AM  . thumb surgery Right   . TONSILLECTOMY  1973    Family Psychiatric History:   Family History:  Family History  Problem Relation Age of Onset  . Cancer Father        Abdomen with mastasis  . Cancer Mother   . Heart disease Mother   . Kidney disease Neg Hx   . Bladder Cancer Neg Hx   . Prostate cancer Neg Hx   . Kidney cancer Neg Hx     Social History:   Social History   Socioeconomic History  . Marital status: Divorced    Spouse name: Not on file  . Number of children: 1  . Years of education: Not on file  . Highest education level: Not on file  Occupational History  . Occupation: Therapist, art Rep at [employer]  . Financial resource strain: Not on file  . Food insecurity:    Worry: Not on file    Inability: Not on file  . Transportation needs:    Medical: Not on file    Non-medical: Not on file  Tobacco Use  . Smoking status: Former Smoker    Years: 25.00    Types: Cigarettes    Last attempt to quit: 11/16/2012    Years since quitting: 6.1  . Smokeless tobacco: Never Used  . Tobacco comment: occasional use  Substance and Sexual Activity  . Alcohol use: No    Alcohol/week: 0.0 standard drinks  . Drug use: No    Types: Methylphenidate  . Sexual activity: Never  Lifestyle  . Physical activity:    Days per week: Not on file    Minutes per session: Not on file  . Stress: Not on file  Relationships  . Social connections:    Talks on phone: Not on file    Gets together: Not on file    Attends religious service: Not on file    Active member of club or organization: Not on file    Attends meetings of clubs or organizations: Not on file    Relationship status: Not on file  Other Topics Concern  . Not on file  Social History Narrative  . Not on file    Additional Social History:   Allergies:   Allergies  Allergen Reactions  . Albuterol Shortness Of Breath and Other (See Comments)    Makes pt feel jittery/  tacycardic  . Crestor [Rosuvastatin] Other (See Comments)    Joint pain, muscle pain, and hair loss  . Halcion [Triazolam] Other (See Comments)    Dizziness,headaches,bladder problems  . Levaquin [Levofloxacin In D5w] Diarrhea and Itching    Shoulder pain  . Naproxen Sodium Swelling    Patient tolerates in small doses  . Tylenol [Acetaminophen] Swelling    Patient tolerates in small doses  . Cefaclor Other (See Comments)    Doesn't  remember---unsure if actually allergic   . Diclofenac Sodium Other (See Comments)    "made very sick"  . Sulfa Antibiotics Itching    Unsure of reaction possibly itching  . Tramadol Itching and Nausea And Vomiting  . Aripiprazole Other (See Comments)    Muscle tension/cramping  . Ibuprofen Swelling    Patient tolerates in small doses    Metabolic Disorder Labs: Lab Results  Component Value Date   HGBA1C 6.0 11/24/2018   No results found for: PROLACTIN Lab Results  Component Value Date   CHOL 318 (H) 11/24/2018   TRIG 281.0 (H) 11/24/2018   HDL 63.60 11/24/2018   CHOLHDL 5 11/24/2018   VLDL 56.2 (H) 11/24/2018   LDLCALC 223 (H) 11/13/2014     Current Medications: Current Outpatient Medications  Medication Sig Dispense Refill  . ASPERCREME LIDOCAINE EX Apply 1 application topically 4 (four) times daily as needed (for pain.).     Marland Kitchen aspirin 325 MG tablet Take 325 mg by mouth every 4 (four) hours as needed for headache.     Marland Kitchen azelastine (ASTELIN) 0.1 % nasal spray PLACE 2 SPRAYS INTO BOTH NOSTRILS 2 (TWO) TIMES DAILY. 30 mL 0  . BIOTIN PO Take 200 mg by mouth daily.     . calcipotriene-betamethasone (TACLONEX) ointment Apply 1 application topically daily as needed (for psorasis).     . Cholecalciferol (VITAMIN D3) 10000 units capsule Take 30,000 Units by mouth daily.     . clobetasol cream (TEMOVATE) 7.59 % Apply 1 application topically 2 (two) times daily. 30 g 2  . cyanocobalamin (,VITAMIN B-12,) 1000 MCG/ML injection Inject 1 mL (1,000 mcg  total) into the muscle every 3 (three) months. 1 mL 0  . diclofenac sodium (VOLTAREN) 1 % GEL Apply 4 g topically 4 (four) times daily. 500 g 5  . docusate sodium (COLACE) 100 MG capsule Take 1 capsule (100 mg total) by mouth 2 (two) times daily. 60 capsule 0  . DULoxetine (CYMBALTA) 20 MG capsule Take 2 capsules (40 mg total) by mouth at bedtime. 180 capsule 3  . HYDROcodone-acetaminophen (NORCO) 10-325 MG tablet Take 1 tablet by mouth every 6 (six) hours as needed for moderate pain or severe pain. Supervising physician; Dr Eliezer Lofts; DEA# [id number] 120 tablet 0  . hydroquinone 4 % cream 1 APPLICATION TOPICALLY DAILY AS NEEDED FOR BLEMISHES.  3  . hydrOXYzine (VISTARIL) 50 MG capsule 1   q  Noon  1  q 6:00 pm  1  prn 90 capsule 5  . ketoconazole (NIZORAL) 2 % cream Apply 1 application topically daily. 30 g 0  . lamoTRIgine (LAMICTAL) 150 MG tablet Take 2 tablets (300 mg total) by mouth at bedtime. 60 tablet 5  . levalbuterol (XOPENEX HFA) 45 MCG/ACT inhaler Inhale 1-2 puffs into the lungs every 6 (six) hours as needed for wheezing. 1 Inhaler 0  . Lysine 1000 MG TABS Take 2,000-4,000 mg by mouth at bedtime. 2000 mg scheduled at bedtime and patient will take 4000 mg if she has outbreak    . Magnesium 500 MG TABS Take 1 tablet by mouth at bedtime.    Marland Kitchen neomycin-bacitracin-polymyxin (NEOSPORIN) [phone] ointment Apply 1 application topically 4 (four) times daily as needed (for cut/scrapes.).    Marland Kitchen nicotine (NICODERM CQ - DOSED IN MG/24 HOURS) 21 mg/24hr patch Place 1 patch (21 mg total) onto the skin daily. 28 patch 3  . pantoprazole (PROTONIX) 40 MG tablet Take 40 mg by mouth daily.    . phenazopyridine (  AZO-TABS) 95 MG tablet Take 95 mg by mouth 3 (three) times daily as needed for pain.    . polyethylene glycol powder (GLYCOLAX/MIRALAX) powder MIX 17 GRAMS (1 CAPFUL) WITH 4-8 OZ OF LIQUID AND TAKE BY MOUTH TWICE DAILY AS NEEDED 527 g 0  . tamsulosin (FLOMAX) 0.4 MG CAPS capsule Take 1 capsule (0.4  mg total) by mouth daily. 30 capsule 11  . temazepam (RESTORIL) 30 MG capsule TAKE ONE TO TWO CAPSULES BY MOUTH NIGHTLY AT BEDTIME 60 capsule 4  . tiZANidine (ZANAFLEX) 4 MG tablet TAKE 2 TABLETS (8 MG TOTAL) BY MOUTH EVERY 6 (SIX) HOURS AS NEEDED FOR MUSCLE SPASMS. 720 tablet 0  . traZODone (DESYREL) 50 MG tablet Take 2 tablets (100 mg total) by mouth at bedtime. 60 tablet 7  . zolpidem (AMBIEN) 10 MG tablet Take 1 tablet (10 mg total) by mouth at bedtime. 30 tablet 5   No current facility-administered medications for this visit.     Neurologic: Headache: No Seizure: No Paresthesias:No  Musculoskeletal: Strength & Muscle Tone: within normal limits Gait & Station: normal Patient leans: N/A  Psychiatric Specialty Exam: ROS  Blood pressure 128/74, pulse 80, height 5\' 9"  (1.753 m), weight 200 lb 6.4 oz (90.9 kg), last menstrual period 08/23/2014.Body mass index is 29.59 kg/m.  General Appearance: Casual  Eye Contact:  Good  Speech:  Clear and Coherent  Volume:  Normal  Mood:  Negative  Affect:  Appropriate  Thought Process:  Goal Directed  Orientation:  NA  Thought Content:  Logical  Suicidal Thoughts:  No  Homicidal Thoughts:  No  Memory:  Negative  Judgement:  Good  Insight:  Good  Psychomotor Activity:  Normal  Concentration:    Recall:    Fund of Knowledge:Good  Language: Good  Akathisia:  No  Handed:  Right  AIMS (if indicated):    Assets:  Desire for Improvement  ADL's:  Intact  Cognition: WNL  Sleep:      2/14/202012:23 PM  At this time the patient seems to be fairly stable.  Her #1 problem seems to be that of bipolar disorder.  Patient takes 300 mg of Lamictal.  Her second problem is that of insomnia.  For insomnia she takes 60 mg of Restoril, and Ambien 10 mg.  She also takes trazodone.  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  She takes Cymbalta for this condition.  At this time she is been unable to find a therapist.  03 problems which are  connected to each other he has bipolar disorder depressed type as well as insomnia.  This patient she will return to see me in 3 months for a 30-minute focus psychotherapy session.

## 2019-01-10 ENCOUNTER — Other Ambulatory Visit: Payer: Self-pay | Admitting: Primary Care

## 2019-01-10 DIAGNOSIS — J3089 Other allergic rhinitis: Secondary | ICD-10-CM

## 2019-01-10 NOTE — Progress Notes (Signed)
Dr. Frederico Hamman T. Marjan Rosman, MD, [facility] [place], [address] Phone: [phone] Fax: [phone]  01/11/2019  Patient: Emily Moon, MRN: [id number], DOB: [birth year], 58 y.o.  Primary Physician:  [name], NP   Chief Complaint  Patient presents with  . Hand Pain    Right Ring Finger   Subjective:   Emily Moon is a 58 y.o. very pleasant female patient who presents with the following:  Well-known patient presents with 4th digit pain on the R hand.  2 weeks ago  She is a very nice lady, and she injured her right fourth digit approximately 2 weeks ago.  She hit her finger directly on the wall, and she shows me pictures and it was extensively swollen and bruised.  She is rested this, iced it and use some ibuprofen, and her range of motion is improved now.  Few days ago she had some restriction of motion, so she made an appointment today for me to evaluate it.  Wt Readings from Last 3 Encounters:  01/11/19 192 lb (87.1 kg)  12/21/18 191 lb 4 oz (86.8 kg)  12/02/18 195 lb (88.5 kg)     Past Medical History, Surgical History, Social History, Family History, Problem List, Medications, and Allergies have been reviewed and updated if relevant.  Patient Active Problem List   Diagnosis Date Noted  . Bipolar disorder ([place]) 05/21/2014    Priority: High  . MULTIPLE SCLEROSIS, PROGRESSIVE/RELAPSING 08/28/2010    Priority: High  . Chronic pain syndrome 01/07/2011    Priority: Medium  . Epigastric pain 12/21/2018  . OSA (obstructive sleep apnea) 12/02/2018  . Joint swelling 10/24/2018  . Environmental and seasonal allergies 10/24/2018  . Greater trochanteric bursitis of right hip 09/28/2018  . Acute pain of right shoulder 09/28/2018  . Prediabetes 07/22/2018  . Bipolar I disorder, most recent episode (or current) manic ([place]) 05/20/2018  . Vitamin D deficiency 02/15/2018  . Preventative health care  11/24/2017  . Urinary frequency 03/30/2017  . B12 deficiency 05/22/2016  . Dizziness 03/16/2016  . Medicare annual wellness visit, subsequent 10/25/2015  . Back pain 10/31/2014  . 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 Insomnia 09/01/2011  . Obesity 05/04/2011  . HLD (hyperlipidemia) 01/20/2011  . Tobacco abuse 01/20/2011  . RENAL CALCULUS, RECURRENT 11/13/2010  . GERD 08/28/2010  . Osteoarthritis 08/28/2010  . NEPHROLITHIASIS, HX OF 08/28/2010    Past Medical History:  Diagnosis Date  . Arthritis    osteo  . Asthma   . Bipolar disorder ([place]) 05/21/14  . Cataracts, bilateral   . DDD (degenerative disc disease), cervical    also back  . Depression   . Dizziness    Positional  . Edema    feet/legs  . Fibromyalgia syndrome   . Fungal infection    Finger nails  . GERD (gastroesophageal reflux disease)   . Gout   . Headache    seasonal allergies  . Heart palpitations   . 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 Hypercholesterolemia   . Multiple sclerosis ([facility])    weakness  . Nephrolithiasis    kidney stones  . Osteoporosis    osteoarthritis  . Pneumonia   . PONV (postoperative nausea and vomiting)    no problem after cataract surgery  . Psoriasis   . Renal stone   . Shortness of breath  dyspnea    wheezing  . Sleep apnea 2012   sleep study / slight, no interventions  . Urinary frequency     Past Surgical History:  Procedure Laterality Date  . CATARACT EXTRACTION W/PHACO Left 05/21/2015   Procedure: CATARACT EXTRACTION PHACO AND INTRAOCULAR LENS PLACEMENT (IOC);  Surgeon: Birder Robson, MD;  Location: [facility] ORS;  Service: Ophthalmology;  Laterality: Left;  [place] 00:35 AP% 22.9 CDE 8.11 fluid pack lot #[id number]  . CATARACT EXTRACTION W/PHACO Right 06/04/2015   Procedure: CATARACT EXTRACTION PHACO AND INTRAOCULAR LENS PLACEMENT (IOC);  Surgeon: Birder Robson, MD;  Location: [facility] OR[facility];  Service: Ophthalmology;  Laterality: Right;  US:00:48 AP%: 10.5 CDE:5.08 Fluid lot #[id number]  . CYSTOSCOPY/URETEROSCOPY/HOLMIUM LASER/STENT PLACEMENT Bilateral 09/22/2016   Procedure: CYSTOSCOPY/URETEROSCOPY/HOLMIUM LASER/STENT PLACEMENT;  Surgeon: Hollice Espy, MD;  Location: [facility] ORS;  Service: Urology;  Laterality: Bilateral;  . EYE SURGERY  2015   tissue biopsy  . FOOT SURGERY  2015  . JOINT REPLACEMENT Left 2013   hip replacement  . LITHOTRIPSY    . PTOSIS REPAIR Bilateral 02/18/2016   Procedure: BILATERAL PTOSIS REPAIR UPPER EYELIDS;  Surgeon: Karle Starch, MD;  Location: [facility];  Service: Ophthalmology;  Laterality: Bilateral;  LEAVE PT EARLY AM  . thumb surgery Right   . TONSILLECTOMY  1973    Social History   Socioeconomic History  . Marital status: Divorced    Spouse name: Not on file  . Number of children: 1  . Years of education: Not on file  . Highest education level: Not on file  Occupational History  . Occupation: Therapist, art Rep at [employer]  . Financial resource strain: Not on file  . Food insecurity:    Worry: Not on file    Inability: Not on file  . Transportation needs:    Medical: Not on file    Non-medical: Not on file  Tobacco Use  . Smoking status: Former Smoker    Years: 25.00    Types: Cigarettes    Last attempt to quit: 11/16/2012    Years since quitting: 6.1  . Smokeless tobacco: Never Used  . Tobacco comment: occasional use  Substance and Sexual Activity  . Alcohol use: No    Alcohol/week: 0.0 standard drinks  . Drug use: No    Types: Methylphenidate  . Sexual activity: Never  Lifestyle  . Physical activity:    Days per week: Not on file    Minutes per session: Not on file  . Stress: Not on file  Relationships  . Social connections:    Talks on phone: Not on file    Gets together: Not on file    Attends religious service: Not on file    Active  member of club or organization: Not on file    Attends meetings of clubs or organizations: Not on file    Relationship status: Not on file  . Intimate partner violence:    Fear of current or ex partner: Not on file    Emotionally abused: Not on file    Physically abused: Not on file    Forced sexual activity: Not on file  Other Topics Concern  . Not on file  Social History Narrative  . Not on file    Family History  Problem Relation Age of Onset  . Cancer Father        Abdomen with mastasis  . Cancer Mother   . Heart disease  Mother   . Kidney disease Neg Hx   . Bladder Cancer Neg Hx   . Prostate cancer Neg Hx   . Kidney cancer Neg Hx     Allergies  Allergen Reactions  . Albuterol Shortness Of Breath and Other (See Comments)    Makes pt feel jittery/ tacycardic  . Crestor [Rosuvastatin] Other (See Comments)    Joint pain, muscle pain, and hair loss  . Halcion [Triazolam] Other (See Comments)    Dizziness,headaches,bladder problems  . Levaquin [Levofloxacin In D5w] Diarrhea and Itching    Shoulder pain  . Naproxen Sodium Swelling    Patient tolerates in small doses  . Tylenol [Acetaminophen] Swelling    Patient tolerates in small doses  . Cefaclor Other (See Comments)    Doesn't remember---unsure if actually allergic   . Diclofenac Sodium Other (See Comments)    "made very sick"  . Sulfa Antibiotics Itching    Unsure of reaction possibly itching  . Tramadol Itching and Nausea And Vomiting  . Aripiprazole Other (See Comments)    Muscle tension/cramping  . Ibuprofen Swelling    Patient tolerates in small doses    Medication list reviewed and updated in full in [facility].  GEN: No fevers, chills. Nontoxic. Primarily MSK c/o today. MSK: Detailed in the HPI GI: tolerating PO intake without difficulty Neuro: No numbness, parasthesias, or tingling associated. Otherwise the pertinent positives of the ROS are noted above.   Objective:   BP 110/64   Pulse 98    Temp 99.1 F (37.3 C) (Oral)   Ht 5\' 9"  (1.753 m)   Wt 192 lb (87.1 kg)   LMP 08/23/2014   BMI 28.35 kg/m    GEN: WDWN, NAD, Non-toxic, A & O x 3 HEENT: Atraumatic, Normocephalic. Neck supple. No masses, No LAD. Ears and Nose: No external deformity. EXTR: No c/c/e NEURO Normal gait.  PSYCH: Normally interactive. Conversant. Not depressed or anxious appearing.  Calm demeanor.    Right fourth digit does have some pain at the DIP joint, the PIP joint, as well as the middle shaft.  Extension and flexion are preserved throughout DIP, PIP, and MCP joints.  Currently, there is no significant bruising.  Throughout the rest of the hand and wrist, all bony anatomy is nontender.  Radiology: 3 view finger xr, 4th, R Indication: pain Findings: She has a small avulsion fracture at the proximal aspect, volar aspect of the intermediate phalanx on the fourth right digit.  There is some calcification on the mid shaft which could represent an old fracture of indeterminate age, but not acute. Electronically Signed  By: Owens Loffler, MD On: 01/11/2019 5:27 PM   Assessment and Plan:   Closed nondisplaced fracture of middle phalanx of right ring finger, initial encounter  Finger pain, right - Plan: DG Finger Ring Right  38-week old injury.  Flexion and extension are preserved.  Small fracture, expect this will do well.    I buddy taped her third and fourth fingers together, and I would continue this for 7-10 days, then discontinue.  Continue to work on motion.  She may have had an additional volar plate injury given location.  Follow-up: No follow-ups on file.  Orders Placed This Encounter  Procedures  . DG Finger Ring Right    Signed,  Esiah Bazinet T. Abrianna Sidman, MD   Outpatient Encounter Medications as of 01/11/2019  Medication Sig  . ASPERCREME LIDOCAINE EX Apply 1 application topically 4 (four) times daily as needed (for pain.).   Marland Kitchen  aspirin 325 MG tablet Take 325 mg by mouth every 4  (four) hours as needed for headache.   Marland Kitchen azelastine (ASTELIN) 0.1 % nasal spray PLACE 2 SPRAYS INTO BOTH NOSTRILS 2 (TWO) TIMES DAILY.  Marland Kitchen BIOTIN PO Take 200 mg by mouth daily.   . calcipotriene-betamethasone (TACLONEX) ointment Apply 1 application topically daily as needed (for psorasis).   . Cholecalciferol (VITAMIN D3) 10000 units capsule Take 30,000 Units by mouth daily.   . clobetasol cream (TEMOVATE) 2.02 % Apply 1 application topically 2 (two) times daily.  . cyanocobalamin (,VITAMIN B-12,) 1000 MCG/ML injection Inject 1 mL (1,000 mcg total) into the muscle every 3 (three) months.  . diclofenac sodium (VOLTAREN) 1 % GEL Apply 4 g topically 4 (four) times daily.  Marland Kitchen docusate sodium (COLACE) 100 MG capsule Take 1 capsule (100 mg total) by mouth 2 (two) times daily.  . DULoxetine (CYMBALTA) 20 MG capsule Take 2 capsules (40 mg total) by mouth at bedtime.  Marland Kitchen HYDROcodone-acetaminophen (NORCO) 10-325 MG tablet Take 1 tablet by mouth every 6 (six) hours as needed for moderate pain or severe pain. Supervising physician; Dr Eliezer Lofts; DEA# [id number]  . hydroquinone 4 % cream 1 APPLICATION TOPICALLY DAILY AS NEEDED FOR BLEMISHES.  . hydrOXYzine (VISTARIL) 50 MG capsule 1   q  Noon  1  q 6:00 pm  1  prn  . ketoconazole (NIZORAL) 2 % cream Apply 1 application topically daily.  Marland Kitchen lamoTRIgine (LAMICTAL) 150 MG tablet Take 2 tablets (300 mg total) by mouth at bedtime.  . levalbuterol (XOPENEX HFA) 45 MCG/ACT inhaler Inhale 1-2 puffs into the lungs every 6 (six) hours as needed for wheezing.  Marland Kitchen Lysine 1000 MG TABS Take 2,000-4,000 mg by mouth at bedtime. 2000 mg scheduled at bedtime and patient will take 4000 mg if she has outbreak  . Magnesium 500 MG TABS Take 1 tablet by mouth at bedtime.  Marland Kitchen neomycin-bacitracin-polymyxin (NEOSPORIN) [phone] ointment Apply 1 application topically 4 (four) times daily as needed (for cut/scrapes.).  Marland Kitchen nicotine (NICODERM CQ - DOSED IN MG/24 HOURS) 21 mg/24hr patch Place 1  patch (21 mg total) onto the skin daily.  . pantoprazole (PROTONIX) 40 MG tablet Take 40 mg by mouth daily.  . phenazopyridine (AZO-TABS) 95 MG tablet Take 95 mg by mouth 3 (three) times daily as needed for pain.  . polyethylene glycol powder (GLYCOLAX/MIRALAX) powder MIX 17 GRAMS (1 CAPFUL) WITH 4-8 OZ OF LIQUID AND TAKE BY MOUTH TWICE DAILY AS NEEDED  . tamsulosin (FLOMAX) 0.4 MG CAPS capsule Take 1 capsule (0.4 mg total) by mouth daily.  . temazepam (RESTORIL) 30 MG capsule TAKE ONE TO TWO CAPSULES BY MOUTH NIGHTLY AT BEDTIME  . tiZANidine (ZANAFLEX) 4 MG tablet TAKE 2 TABLETS (8 MG TOTAL) BY MOUTH EVERY 6 (SIX) HOURS AS NEEDED FOR MUSCLE SPASMS.  Marland Kitchen traZODone (DESYREL) 50 MG tablet Take 2 tablets (100 mg total) by mouth at bedtime.  Marland Kitchen zolpidem (AMBIEN) 10 MG tablet Take 1 tablet (10 mg total) by mouth at bedtime.  . [DISCONTINUED] azelastine (ASTELIN) 0.1 % nasal spray PLACE 2 SPRAYS INTO BOTH NOSTRILS 2 (TWO) TIMES DAILY.   No facility-administered encounter medications on file as of 01/11/2019.

## 2019-01-11 ENCOUNTER — Encounter: Payer: Self-pay | Admitting: Family Medicine

## 2019-01-11 ENCOUNTER — Ambulatory Visit (INDEPENDENT_AMBULATORY_CARE_PROVIDER_SITE_OTHER)
Admission: RE | Admit: 2019-01-11 | Discharge: 2019-01-11 | Disposition: A | Discharge status: Home / Self Care | Payer: PPO | Source: Ambulatory Visit | Attending: Family Medicine | Admitting: Family Medicine

## 2019-01-11 ENCOUNTER — Ambulatory Visit (INDEPENDENT_AMBULATORY_CARE_PROVIDER_SITE_OTHER): Payer: PPO | Admitting: Family Medicine

## 2019-01-11 VITALS — BP 110/64 | HR 98 | Temp 99.1°F | Ht 69.0 in | Wt 192.0 lb

## 2019-01-11 DIAGNOSIS — M79644 Pain in right finger(s): Secondary | ICD-10-CM

## 2019-01-11 DIAGNOSIS — S62654A Nondisplaced fracture of medial phalanx of right ring finger, initial encounter for closed fracture: Secondary | ICD-10-CM

## 2019-01-11 IMAGING — DX DG FINGER RING 2+V*R*
3 series · 3 of 3 positions shown · non-contrast
Comparison: [DATE]

CLINICAL DATA: Acute pain RIGHT ring finger

EXAM:
RIGHT RING FINGER 2+V

[finger ap]
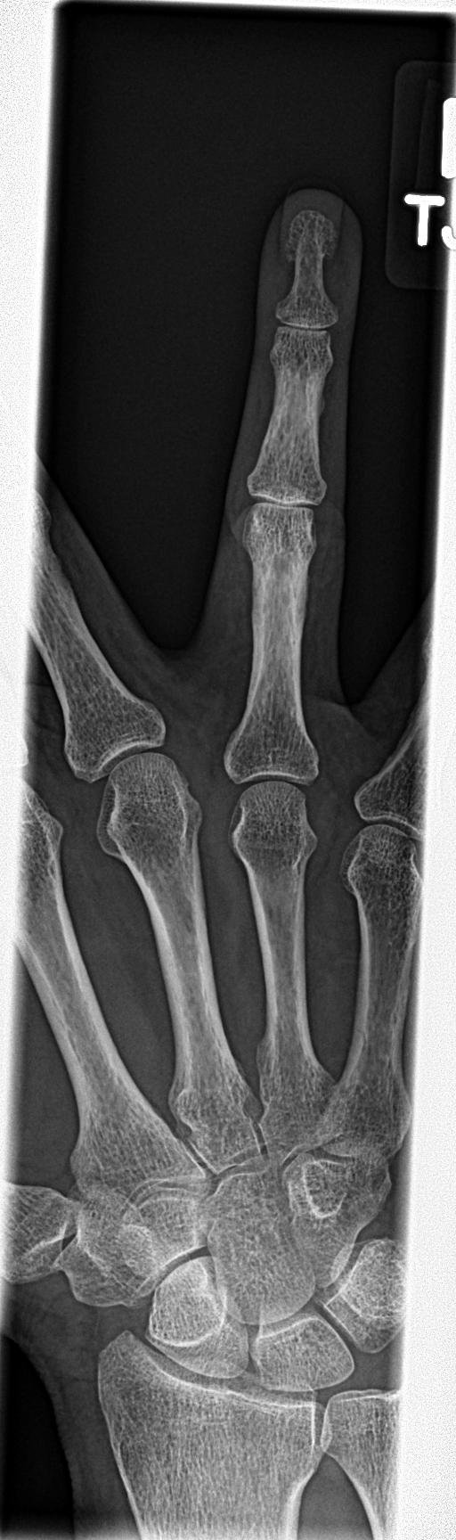

[finger obl]
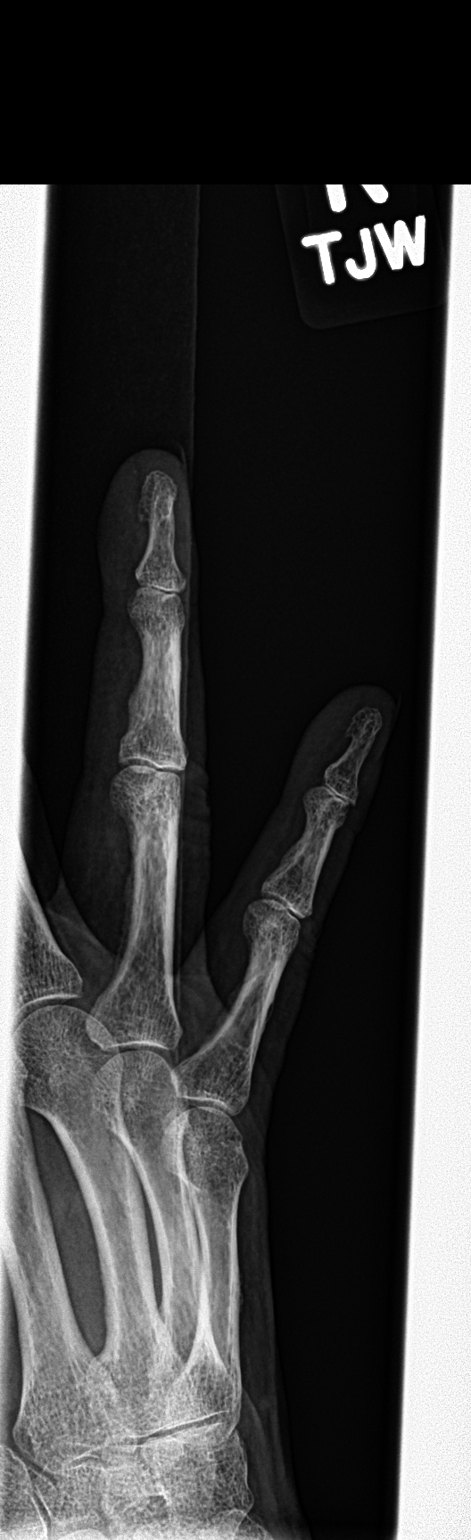

[finger lat]
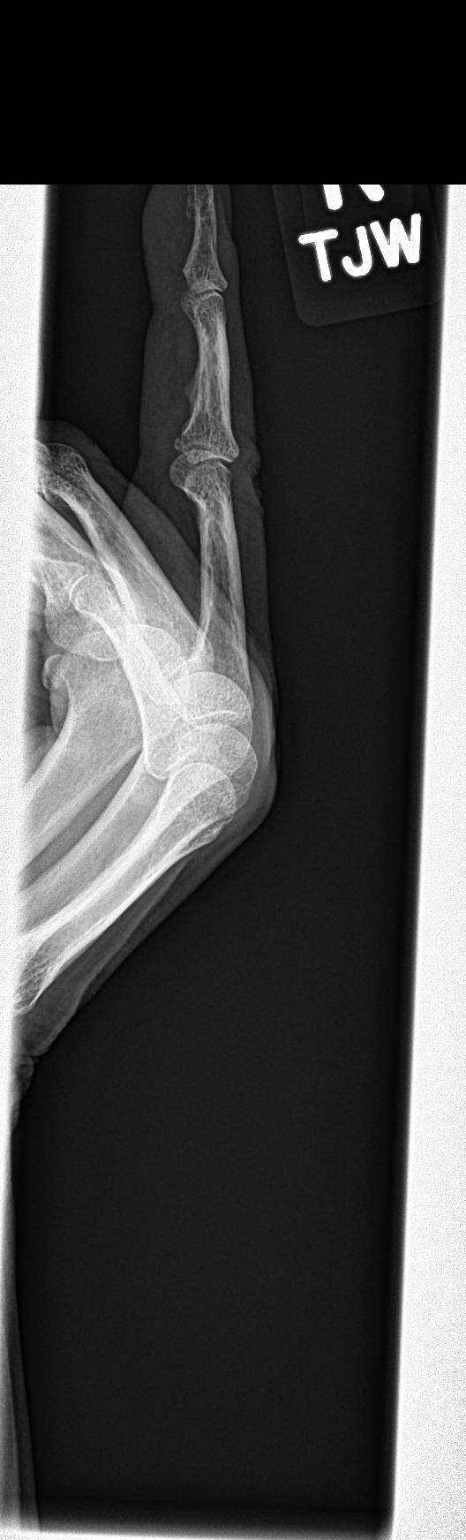

[3 of 3 positions shown; findings below may reference images not displayed]

FINDINGS: Osseous demineralization.

Narrowing of DIP and PIP joints.

No definite acute fracture, dislocation, or bone destruction.

Tiny rounded calcific density is seen volar toa the base of the
middle phalanx on the lateral view, appears corticated and old,
though new since [0O].
IMPRESSION: Small rounded corticated density volar to the base of the middle
phalanx, does not have the appearance of an acute fracture fragment.

No definite acute bony abnormalities.

## 2019-01-12 ENCOUNTER — Encounter: Payer: Self-pay | Admitting: Family Medicine

## 2019-01-12 ENCOUNTER — Other Ambulatory Visit: Payer: Self-pay | Admitting: Primary Care

## 2019-01-12 DIAGNOSIS — M62838 Other muscle spasm: Secondary | ICD-10-CM

## 2019-01-12 NOTE — Telephone Encounter (Signed)
I called to explain to her.  This is an acute injury, and in my opinion c/w with acute avulsion fracture at the PIP joint with extensive bruising and swelling.   I reviewed with her again what this means, and I disagree with the radiologist, and I think this is OK.  I have examined the patient.

## 2019-01-16 DIAGNOSIS — J4520 Mild intermittent asthma, uncomplicated: Secondary | ICD-10-CM

## 2019-01-16 DIAGNOSIS — M1990 Unspecified osteoarthritis, unspecified site: Secondary | ICD-10-CM

## 2019-01-18 MED ORDER — LEVALBUTEROL HCL 1.25 MG/3ML IN NEBU: 1.2500 mg | INHALATION_SOLUTION | RESPIRATORY_TRACT | 0 refills | Status: DC | PRN

## 2019-01-18 MED ORDER — HYDROCODONE-ACETAMINOPHEN 10-325 MG PO TABS: 1.0000 | ORAL_TABLET | Freq: Four times a day (QID) | ORAL | 0 refills | Status: DC | PRN

## 2019-01-21 ENCOUNTER — Other Ambulatory Visit (HOSPITAL_COMMUNITY): Payer: Self-pay | Admitting: Psychiatry

## 2019-01-29 NOTE — Progress Notes (Signed)
Dr. Frederico Hamman T. Tarri Guilfoil, MD, [facility] [place], [address] Phone: [phone] Fax: [phone]  01/30/2019  Patient: Emily Moon, MRN: [id number], DOB: 06/09/1961, 58 y.o.  Primary Physician:  [name], NP   Chief Complaint  Patient presents with  . Elbow Pain    Right  . Neck Pain  . Back Pain  . Knee Pain  . Hip Pain  . Fall    fell out of chair x 2   Subjective:   Emily Moon is a 58 y.o. very pleasant female patient who presents with the following:  She is a very pleasant well-known patient who presents today after falling twice. History is significant for MS.  She is having knee, elbow, shoulder, and neck pain.  Also back pain. None of these have focal pain.  [place] out of her chair x 2.  Friday morning  R Elbow OK - pain at olecranon, she had a lot of swelling and now has extensive bruising. Wrist ok  Voltaren Gel up to 4 times a day Hydrocodone as needed Lidocaine topical as needed Tizanidine as needed at night  Past Medical History, Surgical History, Social History, Family History, Problem List, Medications, and Allergies have been reviewed and updated if relevant.  Patient Active Problem List   Diagnosis Date Noted  . Bipolar disorder ([place]) 05/21/2014    Priority: High  . MULTIPLE SCLEROSIS, PROGRESSIVE/RELAPSING 08/28/2010    Priority: High  . Chronic pain syndrome 01/07/2011    Priority: Medium  . Epigastric pain 12/21/2018  . OSA (obstructive sleep apnea) 12/02/2018  . Joint swelling 10/24/2018  . Environmental and seasonal allergies 10/24/2018  . Greater trochanteric bursitis of right hip 09/28/2018  . Acute pain of right shoulder 09/28/2018  . Prediabetes 07/22/2018  . Bipolar I disorder, most recent episode (or current) manic ([place]) 05/20/2018  . Vitamin D deficiency 02/15/2018  . Preventative health care 11/24/2017  . Urinary frequency 03/30/2017  .  B12 deficiency 05/22/2016  . Dizziness 03/16/2016  . Medicare annual wellness visit, subsequent 10/25/2015  . Back pain 10/31/2014  . 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 Insomnia 09/01/2011  . Obesity 05/04/2011  . HLD (hyperlipidemia) 01/20/2011  . Tobacco abuse 01/20/2011  . RENAL CALCULUS, RECURRENT 11/13/2010  . GERD 08/28/2010  . Osteoarthritis 08/28/2010  . NEPHROLITHIASIS, HX OF 08/28/2010    Past Medical History:  Diagnosis Date  . Arthritis    osteo  . Asthma   . Bipolar disorder ([place]) 05/21/14  . Cataracts, bilateral   . DDD (degenerative disc disease), cervical    also back  . Depression   . Dizziness    Positional  . Edema    feet/legs  . Fibromyalgia syndrome   . Fungal infection    Finger nails  . GERD (gastroesophageal reflux disease)   . Gout   . Headache    seasonal allergies  . Heart palpitations   . 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 Hypercholesterolemia   . Multiple sclerosis (H[facility])    weakness  . Nephrolithiasis    kidney stones  . Osteoporosis    osteoarthritis  . Pneumonia   . PONV (postoperative nausea and vomiting)    no problem after cataract surgery  . Psoriasis   . Renal stone   . Shortness of breath dyspnea    wheezing  .  Sleep apnea 2012   sleep study / slight, no interventions  . Urinary frequency     Past Surgical History:  Procedure Laterality Date  . CATARACT EXTRACTION W/PHACO Left 05/21/2015   Procedure: CATARACT EXTRACTION PHACO AND INTRAOCULAR LENS PLACEMENT (IOC);  Surgeon: Birder Robson, MD;  Location: [facility];  Service: Ophthalmology;  Laterality: Left;  [place] 00:35 AP% 22.9 CDE 8.11 fluid pack lot #[id number]  . CATARACT EXTRACTION W/PHACO Right 06/04/2015   Procedure: CATARACT EXTRACTION PHACO AND INTRAOCULAR LENS PLACEMENT (IOC);  Surgeon: Birder Robson, MD;  Location: [facility];  Service:  Ophthalmology;  Laterality: Right;  US:00:48 AP%: 10.5 CDE:5.08 Fluid lot #[id number]  . CYSTOSCOPY/URETEROSCOPY/HOLMIUM LASER/STENT PLACEMENT Bilateral 09/22/2016   Procedure: CYSTOSCOPY/URETEROSCOPY/HOLMIUM LASER/STENT PLACEMENT;  Surgeon: Hollice Espy, MD;  Location: [facility];  Service: Urology;  Laterality: Bilateral;  . EYE SURGERY  2015   tissue biopsy  . FOOT SURGERY  2015  . JOINT REPLACEMENT Left 2013   hip replacement  . LITHOTRIPSY    . PTOSIS REPAIR Bilateral 02/18/2016   Procedure: BILATERAL PTOSIS REPAIR UPPER EYELIDS;  Surgeon: Karle Starch, MD;  Location: [facility];  Service: Ophthalmology;  Laterality: Bilateral;  LEAVE PT EARLY AM  . thumb surgery Right   . TONSILLECTOMY  1973    Social History   Socioeconomic History  . Marital status: Divorced    Spouse name: Not on file  . Number of children: 1  . Years of education: Not on file  . Highest education level: Not on file  Occupational History  . Occupation: Therapist, art Rep at [employer]  . Financial resource strain: Not on file  . Food insecurity:    Worry: Not on file    Inability: Not on file  . Transportation needs:    Medical: Not on file    Non-medical: Not on file  Tobacco Use  . Smoking status: Former Smoker    Years: 25.00    Types: Cigarettes    Last attempt to quit: 11/16/2012    Years since quitting: 6.2  . Smokeless tobacco: Never Used  . Tobacco comment: occasional use  Substance and Sexual Activity  . Alcohol use: No    Alcohol/week: 0.0 standard drinks  . Drug use: No    Types: Methylphenidate  . Sexual activity: Never  Lifestyle  . Physical activity:    Days per week: Not on file    Minutes per session: Not on file  . Stress: Not on file  Relationships  . Social connections:    Talks on phone: Not on file    Gets together: Not on file    Attends religious service: Not on file    Active member of club or organization: Not on file     Attends meetings of clubs or organizations: Not on file    Relationship status: Not on file  . Intimate partner violence:    Fear of current or ex partner: Not on file    Emotionally abused: Not on file    Physically abused: Not on file    Forced sexual activity: Not on file  Other Topics Concern  . Not on file  Social History Narrative  . Not on file    Family History  Problem Relation Age of Onset  . Cancer Father        Abdomen with mastasis  . Cancer Mother   . Heart disease Mother   . Kidney disease Neg  Hx   . Bladder Cancer Neg Hx   . Prostate cancer Neg Hx   . Kidney cancer Neg Hx     Allergies  Allergen Reactions  . Albuterol Shortness Of Breath and Other (See Comments)    Makes pt feel jittery/ tacycardic  . Crestor [Rosuvastatin] Other (See Comments)    Joint pain, muscle pain, and hair loss  . Halcion [Triazolam] Other (See Comments)    Dizziness,headaches,bladder problems  . Levaquin [Levofloxacin In D5w] Diarrhea and Itching    Shoulder pain  . Naproxen Sodium Swelling    Patient tolerates in small doses  . Tylenol [Acetaminophen] Swelling    Patient tolerates in small doses  . Cefaclor Other (See Comments)    Doesn't remember---unsure if actually allergic   . Diclofenac Sodium Other (See Comments)    "made very sick"  . Sulfa Antibiotics Itching    Unsure of reaction possibly itching  . Tramadol Itching and Nausea And Vomiting  . Aripiprazole Other (See Comments)    Muscle tension/cramping  . Ibuprofen Swelling    Patient tolerates in small doses    Medication list reviewed and updated in full in [facility].  GEN: No fevers, chills. Nontoxic. Primarily MSK c/o today. MSK: Detailed in the HPI GI: tolerating PO intake without difficulty Neuro: No numbness, parasthesias, or tingling associated. Otherwise the pertinent positives of the ROS are noted above.   Objective:   BP 120/80   Pulse 95   Temp 98.8 F (37.1 C) (Oral)   Ht 5\' 9"   (1.753 m)   Wt 196 lb 4 oz (89 kg)   LMP 08/23/2014   SpO2 97%   BMI 28.98 kg/m    GEN: WDWN, NAD, Non-toxic, Alert & Oriented x 3 HEENT: Atraumatic, Normocephalic.  Ears and Nose: No external deformity. EXTR: No clubbing/cyanosis/edema NEURO: Normal gait.  PSYCH: Normally interactive. Conversant. Not depressed or anxious appearing.  Calm demeanor.    Modest restriction at the neck.  Strength and sensory function in the upper extremities is normal, she does have some tightness in her posterior paracervical musculature as well as her trapezius without dramatic spasm.  Shoulders move in all directions normally and strength is intact.  Right elbow flexes, extends, and rotates at the elbow with some mild tenderness at the olecranon.  Nontender throughout the distal humerus as well as throughout the radius and ulna.  There is extensive bruising and some modest swelling throughout.  Nontender at the distal radius and ulna bilaterally.  The entirety of the wrist and hand is completely normal in terms of bony pathology with good and normal strength.  Lower back is tender to palpation mildly from L3-S1 bilaterally without any spinal tenderness.  No straight leg raise.  Knees move appropriately with full extension and flexion to 130 degrees.  ACL, PCL, MCL, and LCL are are intact.  There is no significant effusion.  All bony prominences are nontender, and all meniscal testing is normal.  Grossly normal knee examinations.  Radiology: Dg Elbow Complete Right (3+view)  Result Date: 01/31/2019 CLINICAL DATA:  pain after fall from chair. EXAM: RIGHT ELBOW - COMPLETE 3+ VIEW COMPARISON:  None. FINDINGS: There is no evidence of fracture, dislocation, or joint effusion. There is no evidence of arthropathy or other focal bone abnormality. Soft tissues are unremarkable. IMPRESSION: Negative. Electronically Signed   By: Lucrezia Europe M.D.   On: 01/31/2019 08:44    Assessment and Plan:   Elbow pain, right  - Plan: DG  ELBOW COMPLETE RIGHT (3+VIEW)  Acute bilateral knee pain  Cervicalgia  Acute midline low back pain without sciatica  Fall, initial encounter  MULTIPLE SCLEROSIS, PROGRESSIVE/RELAPSING  >25 minutes spent in face to face time with patient, >50% spent in counselling or coordination of care   I reassured her.  She is fallen and is very high risk for multiple falls.  Fortunately, she does not appear to have sustained any fracture, but she has diffuse soft tissue injury, bruising, as well as some bone bruising.  She also has a hematoma on her right elbow.  I would anticipate she will take at a minimum 3 weeks to recover. Range of motion and heat.  Continue with the following:  Voltaren Gel up to 4 times a day Hydrocodone as needed Lidocaine topical as needed Tizanidine as needed at night  Follow-up: if sx > 1 mo  Orders Placed This Encounter  Procedures  . DG ELBOW COMPLETE RIGHT (3+VIEW)    Signed,  Eriyah Fernando T. Nazir Hacker, MD   Outpatient Encounter Medications as of 01/30/2019  Medication Sig  . ASPERCREME LIDOCAINE EX Apply 1 application topically 4 (four) times daily as needed (for pain.).   Marland Kitchen aspirin 325 MG tablet Take 325 mg by mouth every 4 (four) hours as needed for headache.   Marland Kitchen azelastine (ASTELIN) 0.1 % nasal spray PLACE 2 SPRAYS INTO BOTH NOSTRILS 2 (TWO) TIMES DAILY.  Marland Kitchen BIOTIN PO Take 200 mg by mouth daily.   . calcipotriene-betamethasone (TACLONEX) ointment Apply 1 application topically daily as needed (for psorasis).   . Cholecalciferol (VITAMIN D3) 10000 units capsule Take 30,000 Units by mouth daily.   . clobetasol cream (TEMOVATE) 9.93 % Apply 1 application topically 2 (two) times daily.  . cyanocobalamin (,VITAMIN B-12,) 1000 MCG/ML injection Inject 1 mL (1,000 mcg total) into the muscle every 3 (three) months.  . diclofenac sodium (VOLTAREN) 1 % GEL Apply 4 g topically 4 (four) times daily.  Marland Kitchen docusate sodium (COLACE) 100 MG capsule Take 1 capsule  (100 mg total) by mouth 2 (two) times daily.  . DULoxetine (CYMBALTA) 20 MG capsule Take 2 capsules (40 mg total) by mouth at bedtime.  Marland Kitchen HYDROcodone-acetaminophen (NORCO) 10-325 MG tablet Take 1 tablet by mouth every 6 (six) hours as needed for moderate pain or severe pain. Supervising physician; Dr Eliezer Lofts; DEA# [id number]  . hydroquinone 4 % cream 1 APPLICATION TOPICALLY DAILY AS NEEDED FOR BLEMISHES.  . hydrOXYzine (VISTARIL) 50 MG capsule 1   q  Noon  1  q 6:00 pm  1  prn  . ketoconazole (NIZORAL) 2 % cream Apply 1 application topically daily.  Marland Kitchen lamoTRIgine (LAMICTAL) 150 MG tablet Take 2 tablets (300 mg total) by mouth at bedtime.  . levalbuterol (XOPENEX HFA) 45 MCG/ACT inhaler Inhale 1-2 puffs into the lungs every 6 (six) hours as needed for wheezing.  . levalbuterol (XOPENEX) 1.25 MG/3ML nebulizer solution Take 1.25 mg by nebulization every 4 (four) hours as needed for wheezing or shortness of breath.  . Lysine 1000 MG TABS Take 2,000-4,000 mg by mouth at bedtime. 2000 mg scheduled at bedtime and patient will take 4000 mg if she has outbreak  . Magnesium 500 MG TABS Take 1 tablet by mouth at bedtime.  Marland Kitchen neomycin-bacitracin-polymyxin (NEOSPORIN) [phone] ointment Apply 1 application topically 4 (four) times daily as needed (for cut/scrapes.).  Marland Kitchen nicotine (NICODERM CQ - DOSED IN MG/24 HOURS) 21 mg/24hr patch Place 1 patch (21 mg total) onto the skin daily.  . pantoprazole (  PROTONIX) 40 MG tablet Take 40 mg by mouth daily.  . phenazopyridine (AZO-TABS) 95 MG tablet Take 95 mg by mouth 3 (three) times daily as needed for pain.  . polyethylene glycol powder (GLYCOLAX/MIRALAX) powder MIX 17 GRAMS (1 CAPFUL) WITH 4-8 OZ OF LIQUID AND TAKE BY MOUTH TWICE DAILY AS NEEDED  . temazepam (RESTORIL) 30 MG capsule TAKE ONE TO TWO CAPSULES BY MOUTH NIGHTLY AT BEDTIME  . tiZANidine (ZANAFLEX) 4 MG tablet TAKE 2 TABLETS (8 MG TOTAL) BY MOUTH EVERY 6 (SIX) HOURS AS NEEDED FOR MUSCLE SPASMS.  Marland Kitchen  traZODone (DESYREL) 50 MG tablet Take 2 tablets (100 mg total) by mouth at bedtime.  Marland Kitchen zolpidem (AMBIEN) 10 MG tablet Take 1 tablet (10 mg total) by mouth at bedtime.  . [DISCONTINUED] tamsulosin (FLOMAX) 0.4 MG CAPS capsule Take 1 capsule (0.4 mg total) by mouth daily.   No facility-administered encounter medications on file as of 01/30/2019.

## 2019-01-30 ENCOUNTER — Other Ambulatory Visit: Payer: Self-pay

## 2019-01-30 ENCOUNTER — Encounter: Payer: Self-pay | Admitting: Family Medicine

## 2019-01-30 ENCOUNTER — Ambulatory Visit (INDEPENDENT_AMBULATORY_CARE_PROVIDER_SITE_OTHER): Payer: PPO | Admitting: Family Medicine

## 2019-01-30 ENCOUNTER — Ambulatory Visit (INDEPENDENT_AMBULATORY_CARE_PROVIDER_SITE_OTHER)
Admission: RE | Admit: 2019-01-30 | Discharge: 2019-01-30 | Disposition: A | Discharge status: Home / Self Care | Payer: PPO | Source: Ambulatory Visit | Attending: Family Medicine | Admitting: Family Medicine

## 2019-01-30 VITALS — BP 120/80 | HR 95 | Temp 98.8°F | Ht 69.0 in | Wt 196.2 lb

## 2019-01-30 DIAGNOSIS — M542 Cervicalgia: Secondary | ICD-10-CM | POA: Diagnosis not present

## 2019-01-30 DIAGNOSIS — M25521 Pain in right elbow: Secondary | ICD-10-CM | POA: Diagnosis not present

## 2019-01-30 DIAGNOSIS — M545 Low back pain, unspecified: Secondary | ICD-10-CM

## 2019-01-30 DIAGNOSIS — M25562 Pain in left knee: Secondary | ICD-10-CM | POA: Diagnosis not present

## 2019-01-30 DIAGNOSIS — W19XXXA Unspecified fall, initial encounter: Secondary | ICD-10-CM | POA: Diagnosis not present

## 2019-01-30 DIAGNOSIS — M25561 Pain in right knee: Secondary | ICD-10-CM

## 2019-01-30 DIAGNOSIS — G35 Multiple sclerosis: Secondary | ICD-10-CM

## 2019-01-30 DIAGNOSIS — S59901A Unspecified injury of right elbow, initial encounter: Secondary | ICD-10-CM | POA: Diagnosis not present

## 2019-01-30 IMAGING — DX RIGHT ELBOW - COMPLETE 3+ VIEW
4 series · 4 of 4 positions shown · non-contrast
Comparison: None.

CLINICAL DATA: pain after fall from chair.

EXAM:
RIGHT ELBOW - COMPLETE 3+ VIEW

[elbow ap]
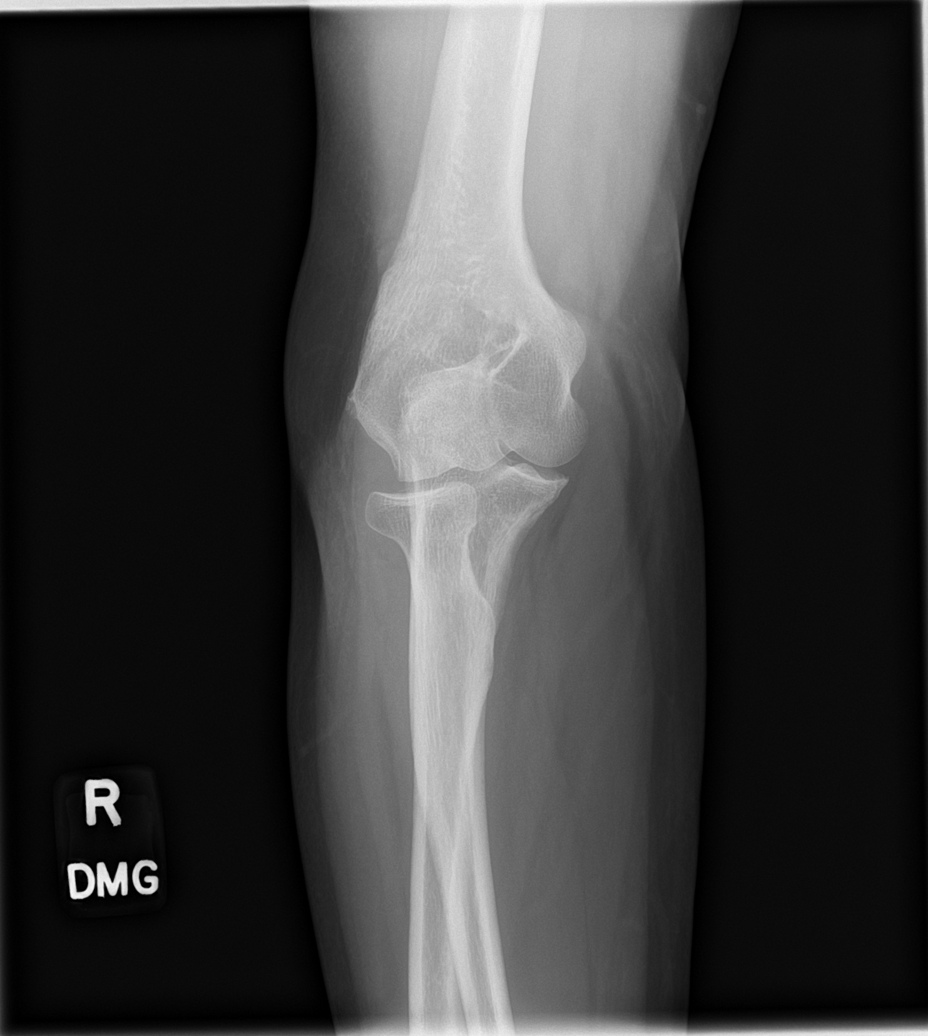

[elbow obl (1 of 2)]
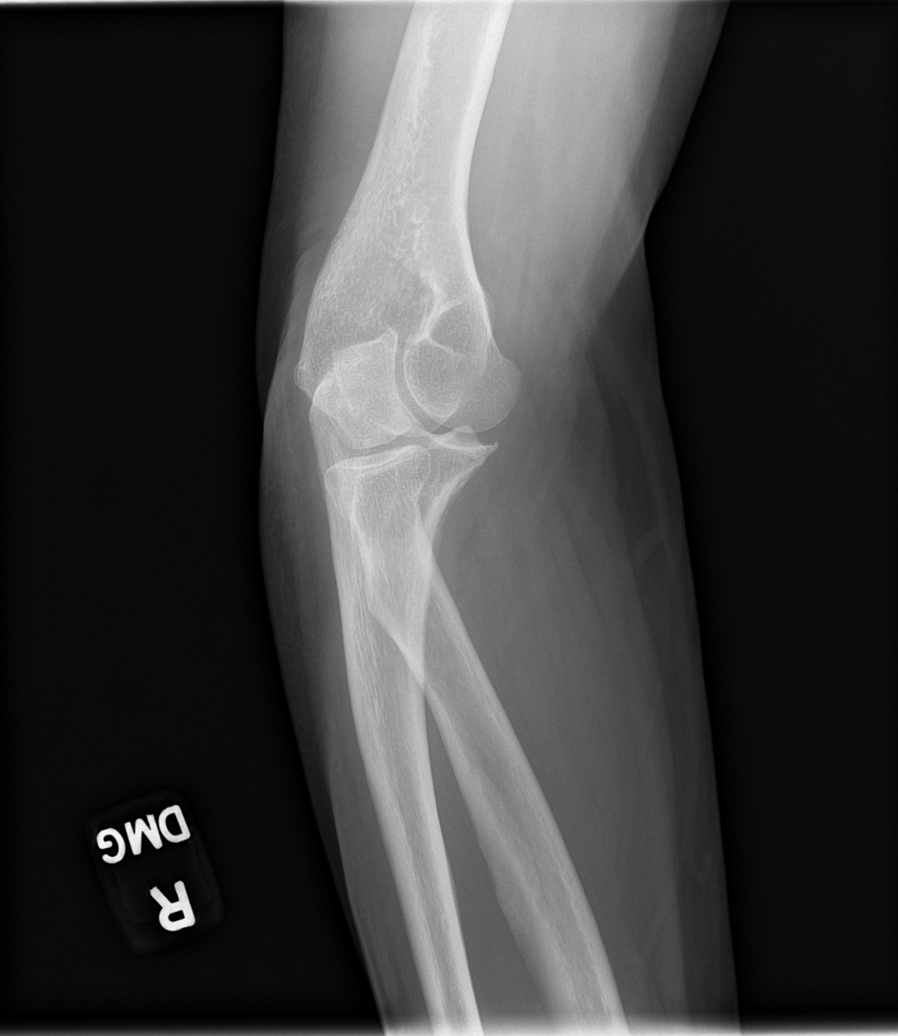

[elbow lat]
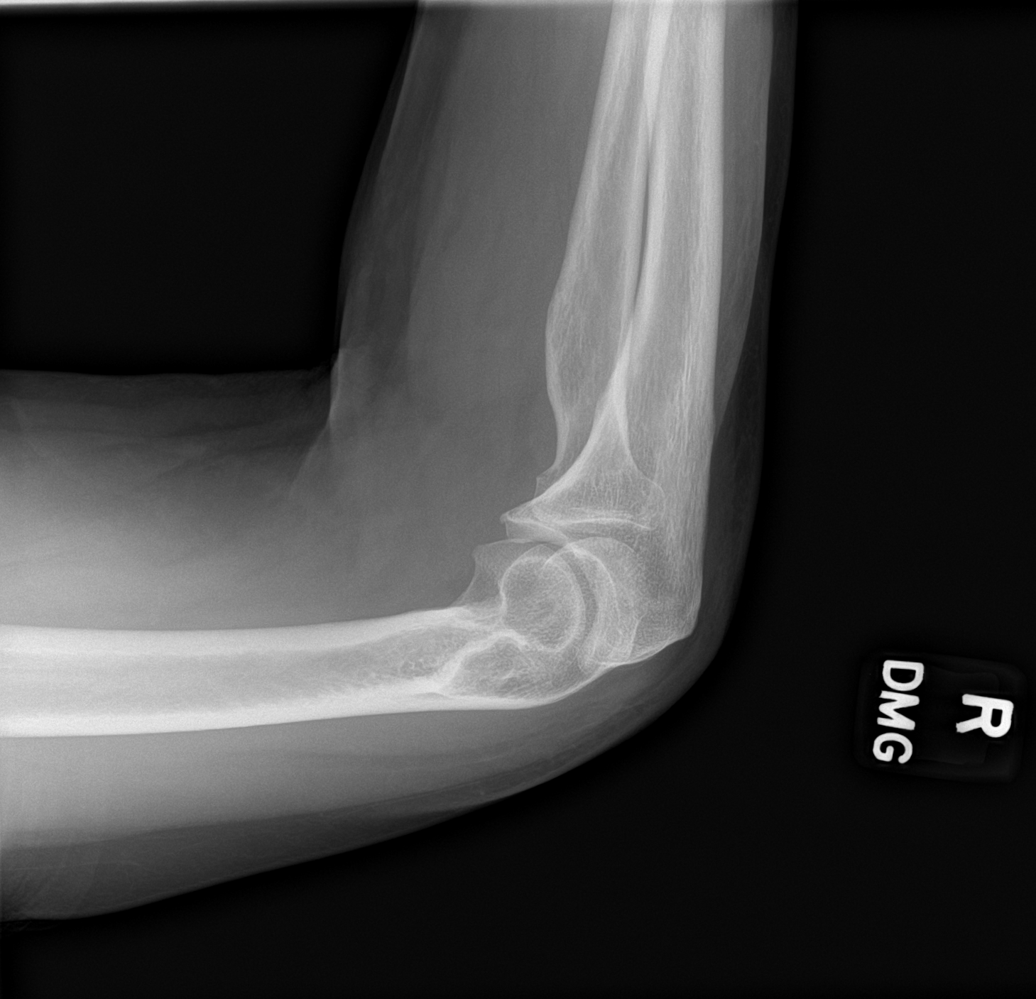

[elbow obl (2 of 2)]
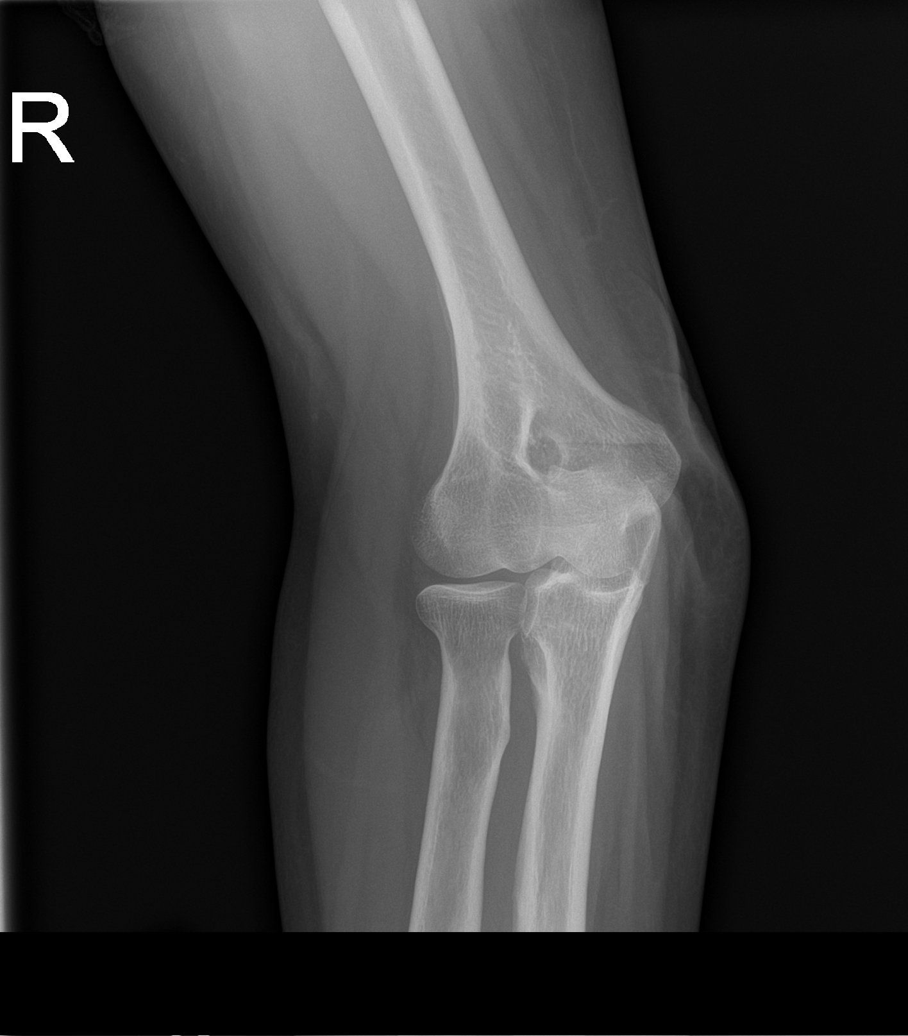

[4 of 4 positions shown; findings below may reference images not displayed]

FINDINGS: There is no evidence of fracture, dislocation, or joint effusion.
There is no evidence of arthropathy or other focal bone abnormality.
Soft tissues are unremarkable.
IMPRESSION: Negative.

## 2019-01-30 NOTE — Patient Instructions (Addendum)
Voltaren Gel up to 4 times a day Hydrocodone as needed Lidocaine topical as needed Tizanidine as needed at night  Heat - heating pad.  Gentle range of motion.

## 2019-02-06 ENCOUNTER — Ambulatory Visit (INDEPENDENT_AMBULATORY_CARE_PROVIDER_SITE_OTHER): Payer: PPO | Admitting: Internal Medicine

## 2019-02-06 ENCOUNTER — Ambulatory Visit: Payer: Self-pay | Admitting: *Deleted

## 2019-02-06 ENCOUNTER — Encounter: Payer: Self-pay | Admitting: Internal Medicine

## 2019-02-06 ENCOUNTER — Emergency Department
Admission: EM | Admit: 2019-02-06 | Discharge: 2019-02-06 | Disposition: A | Discharge status: Home / Self Care | Payer: PPO | Attending: Emergency Medicine | Admitting: Emergency Medicine

## 2019-02-06 ENCOUNTER — Other Ambulatory Visit: Payer: Self-pay

## 2019-02-06 DIAGNOSIS — Z87891 Personal history of nicotine dependence: Secondary | ICD-10-CM | POA: Diagnosis not present

## 2019-02-06 DIAGNOSIS — Z79899 Other long term (current) drug therapy: Secondary | ICD-10-CM | POA: Insufficient documentation

## 2019-02-06 DIAGNOSIS — J45909 Unspecified asthma, uncomplicated: Secondary | ICD-10-CM | POA: Insufficient documentation

## 2019-02-06 DIAGNOSIS — Z96642 Presence of left artificial hip joint: Secondary | ICD-10-CM | POA: Insufficient documentation

## 2019-02-06 DIAGNOSIS — B9789 Other viral agents as the cause of diseases classified elsewhere: Secondary | ICD-10-CM | POA: Insufficient documentation

## 2019-02-06 DIAGNOSIS — J069 Acute upper respiratory infection, unspecified: Secondary | ICD-10-CM | POA: Diagnosis not present

## 2019-02-06 DIAGNOSIS — R0602 Shortness of breath: Secondary | ICD-10-CM

## 2019-02-06 DIAGNOSIS — Z7982 Long term (current) use of aspirin: Secondary | ICD-10-CM | POA: Insufficient documentation

## 2019-02-06 MED ORDER — PREDNISONE 10 MG PO TABS: 40.0000 mg | ORAL_TABLET | Freq: Every day | ORAL | 0 refills | Status: AC

## 2019-02-06 NOTE — Patient Instructions (Signed)
Shortness of Breath, Adult  Shortness of breath means you have trouble breathing. Shortness of breath could be a sign of a medical problem.  Follow these instructions at home:     Watch for any changes in your symptoms.   Do not use any products that contain nicotine or tobacco, such as cigarettes, e-cigarettes, and chewing tobacco.   Do not smoke. Smoking can cause shortness of breath. If you need help to quit smoking, ask your doctor.   Avoid things that can make it harder to breathe, such as:  ? Mold.  ? Dust.  ? Air pollution.  ? Chemical smells.  ? Things that can cause allergy symptoms (allergens), if you have allergies.   Keep your living space clean. Use products that help remove mold and dust.   Rest as needed. Slowly return to your normal activities.   Take over-the-counter and prescription medicines only as told by your doctor. This includes oxygen therapy and inhaled medicines.   Keep all follow-up visits as told by your doctor. This is important.  Contact a doctor if:   Your condition does not get better as soon as expected.   You have a hard time doing your normal activities, even after you rest.   You have new symptoms.  Get help right away if:   Your shortness of breath gets worse.   You have trouble breathing when you are resting.   You feel light-headed or you pass out (faint).   You have a cough that is not helped by medicines.   You cough up blood.   You have pain with breathing.   You have pain in your chest, arms, shoulders, or belly (abdomen).   You have a fever.   You cannot walk up stairs.   You cannot exercise the way you normally do.  These symptoms may represent a serious problem that is an emergency. Do not wait to see if the symptoms will go away. Get medical help right away. Call your local emergency services (911 in the [place] Do not drive yourself to the hospital.  Summary   Shortness of breath is when you have trouble breathing enough air. It can be a sign of a  medical problem.   Avoid things that make it hard for you to breathe, such as smoking, pollution, mold, and dust.   Watch for any changes in your symptoms. Contact your doctor if you do not get better or you get worse.  This information is not intended to replace advice given to you by your health care provider. Make sure you discuss any questions you have with your health care provider.  Document Released: 04/20/2008 Document Revised: 04/04/2018 Document Reviewed: 04/04/2018  Elsevier Interactive Patient Education  2019 [employer]

## 2019-02-06 NOTE — ED Provider Notes (Signed)
[facility] Emergency Department Provider Note  ____________________________________________   None    (approximate)  I have reviewed the triage vital signs and the nursing notes.   HISTORY  Chief Complaint Shortness of Breath    HPI Emily Moon is a 58 y.o. female that presents to the emergency department for evaluation of fever, chills, rhinorrhea, sore throat, cough, SOB, body aches for 2 days.  Patient states that her throat is sore from the rhinorrhea. Cough is non productive. She had some SOB last night, which improved today. She has a history of asthma and used her albuterol inhaler with some relief. She has not used her nebulizer. No sick contacts.  She is concerned for Covid-19.  No recent travel or known contacts with Covid-19.  Patient states that she has been in doctors waiting rooms and grocery and department stores with many sick people.  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  No vomiting, diarrhea.    Past Medical History:  Diagnosis Date  . Arthritis    osteo  . Asthma   . Bipolar disorder ([place]) 05/21/14  . Cataracts, bilateral   . DDD (degenerative disc disease), cervical    also back  . Depression   . Dizziness    Positional  . Edema    feet/legs  . Fibromyalgia syndrome   . Fungal infection    Finger nails  . GERD (gastroesophageal reflux disease)   . Gout   . Headache    seasonal allergies  . Heart palpitations   . 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 Hypercholesterolemia   . Multiple sclerosis (HCC)    weakness  . Nephrolithiasis    kidney stones  . Osteoporosis    osteoarthritis  . Pneumonia   . PONV (postoperative nausea and vomiting)    no problem after cataract surgery  . Psoriasis   . Renal stone   . Shortness of breath dyspnea    wheezing  . Sleep apnea 2012   sleep study / slight, no  interventions  . Urinary frequency     Patient Active Problem List   Diagnosis Date Noted  . Epigastric pain 12/21/2018  . OSA (obstructive sleep apnea) 12/02/2018  . Joint swelling 10/24/2018  . Environmental and seasonal allergies 10/24/2018  . Greater trochanteric bursitis of right hip 09/28/2018  . Acute pain of right shoulder 09/28/2018  . Prediabetes 07/22/2018  . Bipolar I disorder, most recent episode (or current) manic ([place]) 05/20/2018  . Vitamin D deficiency 02/15/2018  . Preventative health care 11/24/2017  . Urinary frequency 03/30/2017  . B12 deficiency 05/22/2016  . Dizziness 03/16/2016  . Medicare annual wellness visit, subsequent 10/25/2015  . Back pain 10/31/2014  . Bipolar disorder ([place]) 05/21/2014  . 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 Insomnia 09/01/2011  . Obesity 05/04/2011  . HLD (hyperlipidemia) 01/20/2011  . Tobacco abuse 01/20/2011  . Chronic pain syndrome 01/07/2011  . RENAL CALCULUS, RECURRENT 11/13/2010  . MULTIPLE SCLEROSIS, PROGRESSIVE/RELAPSING 08/28/2010  . GERD 08/28/2010  . Osteoarthritis 08/28/2010  . NEPHROLITHIASIS, HX OF 08/28/2010    Past Surgical History:  Procedure Laterality Date  . CATARACT EXTRACTION W/PHACO Left 05/21/2015   Procedure: CATARACT EXTRACTION PHACO AND INTRAOCULAR LENS PLACEMENT (IOC);  Surgeon: Birder Robson, MD;  Location: [facility] ORS;  Service: Ophthalmology;  Laterality:  Left;  [place] 00:35 AP% 22.9 CDE 8.11 fluid pack lot #[id number]  . CATARACT EXTRACTION W/PHACO Right 06/04/2015   Procedure: CATARACT EXTRACTION PHACO AND INTRAOCULAR LENS PLACEMENT ([place]);  Surgeon: Birder Robson, MD;  Location: [facility] ORS;  Service: Ophthalmology;  Laterality: Right;  US:00:48 AP%: 10.5 CDE:5.08 Fluid lot #[id number]  . CYSTOSCOPY/URETEROSCOPY/HOLMIUM LASER/STENT PLACEMENT Bilateral 09/22/2016   Procedure:  CYSTOSCOPY/URETEROSCOPY/HOLMIUM LASER/STENT PLACEMENT;  Surgeon: Hollice Espy, MD;  Location: [facility] ORS;  Service: Urology;  Laterality: Bilateral;  . EYE SURGERY  2015   tissue biopsy  . FOOT SURGERY  2015  . JOINT REPLACEMENT Left 2013   hip replacement  . LITHOTRIPSY    . PTOSIS REPAIR Bilateral 02/18/2016   Procedure: BILATERAL PTOSIS REPAIR UPPER EYELIDS;  Surgeon: Karle Starch, MD;  Location: [place];  Service: Ophthalmology;  Laterality: Bilateral;  LEAVE PT EARLY AM  . thumb surgery Right   . TONSILLECTOMY  1973    Prior to Admission medications   Medication Sig Start Date End Date Taking? Authorizing Provider  ASPERCREME LIDOCAINE EX Apply 1 application topically 4 (four) times daily as needed (for pain.).     [provider]  aspirin 325 MG tablet Take 325 mg by mouth every 4 (four) hours as needed for headache.     [provider]  azelastine (ASTELIN) 0.1 % nasal spray PLACE 2 SPRAYS INTO BOTH NOSTRILS 2 (TWO) TIMES DAILY. 01/10/19   [name], NP  BIOTIN PO Take 200 mg by mouth daily.     [provider]  calcipotriene-betamethasone (TACLONEX) ointment Apply 1 application topically daily as needed (for psorasis).     [provider]  Cholecalciferol (VITAMIN D3) 10000 units capsule Take 30,000 Units by mouth daily.     [provider]  clobetasol cream (TEMOVATE) 2.59 % Apply 1 application topically 2 (two) times daily. 12/16/16   Copland, Frederico Hamman, MD  cyanocobalamin (,VITAMIN B-12,) 1000 MCG/ML injection Inject 1 mL (1,000 mcg total) into the muscle every 3 (three) months. 08/08/18   [name], NP  diclofenac sodium (VOLTAREN) 1 % GEL Apply 4 g topically 4 (four) times daily. 11/15/14   Jearld Fenton, NP  docusate sodium (COLACE) 100 MG capsule Take 1 capsule (100 mg total) by mouth 2 (two) times daily. 09/22/16   Hollice Espy, MD  DULoxetine (CYMBALTA) 20 MG capsule Take 2 capsules (40 mg total) by  mouth at bedtime. 12/30/18   Plovsky, Berneta Sages, MD  HYDROcodone-acetaminophen (NORCO) 10-325 MG tablet Take 1 tablet by mouth every 6 (six) hours as needed for moderate pain or severe pain. Supervising physician; Dr Eliezer Lofts; DEA# [id number] 01/18/19   [name], NP  hydroquinone 4 % cream 1 APPLICATION TOPICALLY DAILY AS NEEDED FOR BLEMISHES. 05/16/15   [provider]  hydrOXYzine (VISTARIL) 50 MG capsule 1   q  Noon  1  q 6:00 pm  1  prn 12/30/18   Plovsky, Berneta Sages, MD  ketoconazole (NIZORAL) 2 % cream Apply 1 application topically daily. 07/12/18   [name], NP  lamoTRIgine (LAMICTAL) 150 MG tablet Take 2 tablets (300 mg total) by mouth at bedtime. 12/30/18   Plovsky, Berneta Sages, MD  levalbuterol [facility] HFA) 45 MCG/ACT inhaler Inhale 1-2 puffs into the lungs every 6 (six) hours as needed for wheezing. 08/26/18   [name], NP  levalbuterol Penne Lash) 1.25 MG/3ML nebulizer solution Take 1.25 mg by nebulization every 4 (four) hours as needed for wheezing or shortness of breath. 01/18/19  [name], NP  Lysine 1000 MG TABS Take 2,000-4,000 mg by mouth at bedtime. 2000 mg scheduled at bedtime and patient will take 4000 mg if she has Dentist, Historical, MD  Magnesium 500 MG TABS Take 1 tablet by mouth at bedtime.    [provider]  neomycin-bacitracin-polymyxin (NEOSPORIN) [phone] ointment Apply 1 application topically 4 (four) times daily as needed (for cut/scrapes.).    [provider]  nicotine (NICODERM CQ - DOSED IN MG/24 HOURS) 21 mg/24hr patch Place 1 patch (21 mg total) onto the skin daily. 02/20/16   [name], NP  pantoprazole (PROTONIX) 40 MG tablet Take 40 mg by mouth daily.    [provider]  phenazopyridine (AZO-TABS) 95 MG tablet Take 95 mg by mouth 3 (three) times daily as needed for pain.    [provider]  polyethylene glycol powder (GLYCOLAX/MIRALAX) powder MIX 17 GRAMS (1 CAPFUL) WITH 4-8  OZ OF LIQUID AND TAKE BY MOUTH TWICE DAILY AS NEEDED 11/24/17   [name], NP  predniSONE (DELTASONE) 10 MG tablet Take 4 tablets (40 mg total) by mouth daily for 5 days. 02/06/19 02/11/19  Laban Emperor, PA-C  temazepam (RESTORIL) 30 MG capsule TAKE ONE TO TWO CAPSULES BY MOUTH NIGHTLY AT BEDTIME 12/30/18   Plovsky, Berneta Sages, MD  tiZANidine (ZANAFLEX) 4 MG tablet TAKE 2 TABLETS (8 MG TOTAL) BY MOUTH EVERY 6 (SIX) HOURS AS NEEDED FOR MUSCLE SPASMS. 01/12/19   [name], NP  traZODone (DESYREL) 50 MG tablet Take 2 tablets (100 mg total) by mouth at bedtime. 12/30/18   Plovsky, Berneta Sages, MD  zolpidem (AMBIEN) 10 MG tablet Take 1 tablet (10 mg total) by mouth at bedtime. 12/30/18   Norma Fredrickson, MD    Allergies Albuterol; Crestor [rosuvastatin]; Halcion [triazolam]; Levaquin [levofloxacin in d5w]; Naproxen sodium; Tylenol [acetaminophen]; Cefaclor; Diclofenac sodium; Sulfa antibiotics; Tramadol; Aripiprazole; and Ibuprofen  Family History  Problem Relation Age of Onset  . Cancer Father        Abdomen with mastasis  . Cancer Mother   . Heart disease Mother   . Kidney disease Neg Hx   . Bladder Cancer Neg Hx   . Prostate cancer Neg Hx   . Kidney cancer Neg Hx     Social History Social History   Tobacco Use  . Smoking status: Former Smoker    Years: 25.00    Types: Cigarettes    Last attempt to quit: 11/16/2012    Years since quitting: 6.2  . Smokeless tobacco: Never Used  . Tobacco comment: occasional use  Substance Use Topics  . Alcohol use: No    Alcohol/week: 0.0 standard drinks  . Drug use: No    Types: Methylphenidate    Review of Systems Constitutional: Positive for chills and fever. ENT: Positive for rhinorrhea. Cardiovascular: No chest pain. Respiratory: Positive for cough and occasional shortness of breath. Musculoskeletal: Negative for neck pain nor stiffness. Integumentary: Negative for rash. Neurological: No focal weakness nor numbness.    ____________________________________________   PHYSICAL EXAM:  VITAL SIGNS: ED Triage Vitals  Enc Vitals Group     BP 02/06/19 1841 104/67     Pulse Rate 02/06/19 1841 83     Resp 02/06/19 1841 16     Temp 02/06/19 1841 98.9 F (37.2 C)     Temp Source 02/06/19 1841 Oral     SpO2 02/06/19 1841 99 %     Weight 02/06/19 1848 196 lb (88.9 kg)  Height 02/06/19 1848 5\' 9"  (1.753 m)     Head Circumference --      Peak Flow --      Pain Score 02/06/19 1843 7     Pain Loc --      Pain Edu? --      Excl. in [place]? --     Constitutional: Alert and oriented. Generally well appearing and in no acute distress. Eyes: Conjunctivae are normal.  Nose: Positive for rhinorrhea.  Oropharynx mildly erythematous. Neck: No stridor.  No meningeal signs.   Cardiovascular: Grossly normal heart sounds. Respiratory: No tachypnea or retractions.  No increased work of breathing. Faint scattered wheezes.  Musculoskeletal: No lower extremity tenderness nor edema. No gross deformities of extremities. Neurologic:  Normal speech and language. No gross focal neurologic deficits are appreciated.  Skin:  Skin is warm, dry and intact. No rash noted.   ____________________________________________   LABS (all labs ordered are listed, but only abnormal results are displayed)  Labs Reviewed - No data to display  ____________________________________________   RADIOLOGY  Official radiology report(s): No results found.  ____________________________________________    INITIAL IMPRESSION / MDM / ASSESSMENT AND PLAN / ED COURSE     Patient's diagnosis is consistent with viral URI. Patient's vital signs are stable.  Patient symptoms are consistent with viral etiology.  This patient currently does not meet covid 19 criteria for testing and I explained that in detail to the patient.  The evaluation today is reassuring with no evidence of emergent medical condition that requires further work-up or evaluation  or inpatient treatment.  I provided follow-up recommendations and strict return precautions. She will be started on a course of prednisone for her asthma. She will use her albuterol nebulizer when she gets home. The patient understands and agrees with the plan.   ____________________________________________  FINAL CLINICAL IMPRESSION(S) / ED DIAGNOSES  Final diagnoses:  Viral URI with cough        Note:  This document was prepared using Dragon voice recognition software and may include unintentional dictation errors.   Laban Emperor, PA-C 02/06/19 2128    Rudene Re, MD 02/06/19 2216

## 2019-02-06 NOTE — Telephone Encounter (Signed)
Attempted to contact pt; left message on voicemail [phone]

## 2019-02-06 NOTE — Telephone Encounter (Signed)
Pt called with complaints of low grade temp 99.5; last night she started having a hard time getting air; she also has body aches which have continued since 02/01/2019; she also has a itchy throat; and a stuffy nose; she has used her inhaler and nebulizer; recommendations made per nurse triage protocol; the pt does not want to go to ED; she would like to have phone visit; the pt can be contacted at [phone] because her computer is broken;  pt scheduled with Webb Silversmith, [place] 02/06/2019 at 1600; her usual provider Allie Bossier has no availability; unable to contact flow coordinator via phone or skype; will route to office for notification.  Reason for Disposition . [1] MODERATE difficulty breathing (e.g., speaks in phrases, SOB even at rest, pulse 100-120) AND [2] NEW-onset or WORSE than normal  Answer Assessment - Initial Assessment Questions 1. RESPIRATORY STATUS: "Describe your breathing?" (e.g., wheezing, shortness of breath, unable to speak, severe coughing)      Shortness of breath 2. ONSET: "When did this breathing problem begin?"      02/05/2019 around 2100 3. PATTERN "Does the difficult breathing come and go, or has it been constant since it started?"      contstant 4. SEVERITY: "How bad is your breathing?" (e.g., mild, moderate, severe)    - MILD: No SOB at rest, mild SOB with walking, speaks normally in sentences, can lay down, no retractions, pulse < 100.    - MODERATE: SOB at rest, SOB with minimal exertion and prefers to sit, cannot lie down flat, speaks in phrases, mild retractions, audible wheezing, pulse 100-120.    - SEVERE: Very SOB at rest, speaks in single words, struggling to breathe, sitting hunched forward, retractions, pulse > 120      Moderate to severe 5. RECURRENT SYMPTOM: "Have you had difficulty breathing before?" If so, ask: "When was the last time?" and "What happened that time?"      Yes with asthma and inhaler helped 6. CARDIAC HISTORY: "Do you have any  history of heart disease?" (e.g., heart attack, angina, bypass surgery, angioplasty)      no 7. LUNG HISTORY: "Do you have any history of lung disease?"  (e.g., pulmonary embolus, asthma, emphysema)     asthma 8. CAUSE: "What do you think is causing the breathing problem?"      "I can't guess" 9. OTHER SYMPTOMS: "Do you have any other symptoms? (e.g., dizziness, runny nose, cough, chest pain, fever)     Nasal congestion, body aches, itchy throat, temp 99.5; intermittent cough 10. PREGNANCY: "Is there any chance you are pregnant?" "When was your last menstrual period?"       No, no 11. TRAVEL: "Have you traveled out of the country in the last month?" (e.g., travel history, exposures)       No, no  Protocols used: BREATHING DIFFICULTY-A-AH

## 2019-02-06 NOTE — Progress Notes (Signed)
Virtual Visit via Telephone Note  I connected with Emily Moon on 02/06/19 at 3:48by telephone and verified that I am speaking with the correct person using two identifiers.   I discussed the limitations, risks, security and privacy concerns of performing an evaluation and management service by telephone and the availability of in person appointments. I also discussed with the patient that there may be a patient responsible charge related to this service. The patient expressed understanding and agreed to proceed.   History of Present Illness: Pt reports scratchy throat, cough and shortness of breath. She reports this started 4 days ago. She denies difficulty swallowing. The cough is non productive. She is short of breath at rest, worse with exertion. She denies headache, runny nose, nasal congestion, ear pain, N/V/D. She reports fever up to 99.5, had chills and body aches. She has used her Albuterol inhaler with minimal relief. She has a nebulizer machine but she has not tried this. She has take antihistamines but has not tried anything else OTC. She has not had sick contacts that she knows of. She recently quit smoking.    Observations/Objective: Alert and oriented. Mild shortness of breath, taking deep breaths before finishing complete sentences.    Assessment and Plan:   Shortness of Breath:  Given main symptom of SOB, would recommend eval at [facility] or ER She reports she will "finish cleaning her house and shower" before she goes. I advised her that if she is as SOB as she says she is, she should go to ER immediately Advised her to continue Albuterol, use neb machine if needed Follow up with PCP after ER visit   Follow Up Instructions:    I discussed the assessment and treatment plan with the patient. The patient was provided an opportunity to ask questions and all were answered. The patient agreed with the plan and demonstrated an understanding of the instructions.   The patient  was advised to call back or seek an in-person evaluation if the symptoms worsen or if the condition fails to improve as anticipated.  I provided 8 minutes of non-face-to-face time during this encounter.   Webb Silversmith, NP

## 2019-02-06 NOTE — ED Notes (Signed)
Pt verbalized understanding of d/c instructions, RX, and f/u care. No further questions at this time. Pt ambulatory to the exit with steady gait  

## 2019-02-06 NOTE — ED Triage Notes (Signed)
Pt arrives POV for SOB since last evening, intermittent dry cough  And nasal congestion with low grade fevers. Pt denies any recent travel or exposure. Temp of 100.73F in triage.

## 2019-02-06 NOTE — Telephone Encounter (Signed)
At 3:48 pm Iona Coach RN left v/m on my back line that pt was traiged to go t ED but refused and pt was scheduled for phone note at 4 pm with Avie Echevaria NP.

## 2019-02-06 NOTE — Telephone Encounter (Signed)
Pt hung up before nurse triage could be initiated; will attempt to contact pt.

## 2019-02-08 NOTE — Telephone Encounter (Signed)
Emily Moon, Web ex call please!

## 2019-02-10 ENCOUNTER — Other Ambulatory Visit: Payer: Self-pay

## 2019-02-10 ENCOUNTER — Encounter: Payer: Self-pay | Admitting: Primary Care

## 2019-02-10 ENCOUNTER — Ambulatory Visit (INDEPENDENT_AMBULATORY_CARE_PROVIDER_SITE_OTHER): Payer: PPO | Admitting: Primary Care

## 2019-02-10 DIAGNOSIS — B9789 Other viral agents as the cause of diseases classified elsewhere: Secondary | ICD-10-CM

## 2019-02-10 DIAGNOSIS — J45909 Unspecified asthma, uncomplicated: Secondary | ICD-10-CM | POA: Insufficient documentation

## 2019-02-10 DIAGNOSIS — J4489 Other specified chronic obstructive pulmonary disease: Secondary | ICD-10-CM | POA: Insufficient documentation

## 2019-02-10 DIAGNOSIS — J449 Chronic obstructive pulmonary disease, unspecified: Secondary | ICD-10-CM | POA: Insufficient documentation

## 2019-02-10 DIAGNOSIS — M1990 Unspecified osteoarthritis, unspecified site: Secondary | ICD-10-CM

## 2019-02-10 DIAGNOSIS — J069 Acute upper respiratory infection, unspecified: Secondary | ICD-10-CM | POA: Diagnosis not present

## 2019-02-10 DIAGNOSIS — J4520 Mild intermittent asthma, uncomplicated: Secondary | ICD-10-CM | POA: Diagnosis not present

## 2019-02-10 NOTE — Assessment & Plan Note (Signed)
Symptoms most consistent with viral URI with asthma exacerbation.  Exam is stable, she appears well and is in no distress. Continue prednisone 40 mg daily. Continue Ibuprofen and Tylenol for fevers, body aches. Continue nebulized treatments.  Strict ED precautions provided.

## 2019-02-10 NOTE — Progress Notes (Signed)
Subjective:    Patient ID: Emily Moon, female    DOB: Apr 01, 1961, 58 y.o.   MRN: [id number]  HPI  Virtual Visit via Video Note  I connected with Emily Moon on 02/10/19 at  2:00 PM EDT by a video enabled telemedicine application and verified that I am speaking with the correct person using two identifiers. She is at home, I am in the office.   I discussed the limitations of evaluation and management by telemedicine and the availability of in person appointments. The patient expressed understanding and agreed to proceed.  History of Present Illness:  Emily Moon is a 58 year old female with a history of asthma, bipolar disorder, chronic pain, GERD, multiple sclerosis who presents today via video for emergency department follow up.   She was evaluated via phone visit by Emily Moon on 02/06/19 with complaints of shortness of breath x 4 days, also with low grade fevers, chills, body aches. Her symptoms hadn't improved with her albuterol inhaler so she was referred to the emergency department.  She presented to [facility] ED later that day with same symptoms. She was evaluated in the tent outside of the ED. Her evaluation was consistent for viral URI without Covid-19. No testing completed. She was initiated on a prednisone burst of 40 mg x 5 days and instructed to use her nebulizer as needed. She was discharged home.  Since her visit to the ED her cough has improved and is dry. She has been running fevers that range from normal up to 101.8.  She has been running fevers for about one week. She admits that she stopped vaping about one week ago. She is complaint to her prednisone 40 mg daily, she is also using her nebulizer once daily with improvement. She continues to have intermittent shortness of breath intermittently. Some myalgias. She is taking Ibuprofen 200 mg 2-3 times daily, also taking Tylenol 500 mg HS along with her Norco.   Observations/Objective:  Speaking in complete  sentences, no cough, no audible wheezing. She is in no distress.  Alert and oriented x 3. Skin warm and dry.   Assessment and Plan:  Symptoms most consistent with viral URI with asthma exacerbation.  Exam is stable, she appears well and is in no distress. Continue prednisone 40 mg daily. Continue Ibuprofen and Tylenol for fevers, body aches. Continue nebulized treatments.  Strict ED precautions provided.   Follow Up Instructions:  Continue prednisone 40 mg daily as prescribed. Continue nebulized breathing treatments as needed. Continue Tylenol 500 mg at bedtime. Increase ibuprofen to 400 mg BID for fevers, body aches.  Go to the hospital if your breathing gets wose. Please call me if your fevers don't continue to improve and if you develop sinus pressure, increased shortness of breath.    I discussed the assessment and treatment plan with the patient. The patient was provided an opportunity to ask questions and all were answered. The patient agreed with the plan and demonstrated an understanding of the instructions.   The patient was advised to call back or seek an in-person evaluation if the symptoms worsen or if the condition fails to improve as anticipated.     [name], NP    Review of Systems  Constitutional: Positive for fatigue and fever. Negative for chills.  HENT: Negative for congestion, postnasal drip and sinus pressure.   Respiratory: Positive for cough and shortness of breath.   Musculoskeletal: Positive for myalgias.       Past  Medical History:  Diagnosis Date  . Arthritis    osteo  . Asthma   . Bipolar disorder ([place]) 05/21/14  . Cataracts, bilateral   . DDD (degenerative disc disease), cervical    also back  . Depression   . Dizziness    Positional  . Edema    feet/legs  . Fibromyalgia syndrome   . Fungal infection    Finger nails  . GERD (gastroesophageal reflux disease)   . Gout   . Headache    seasonal allergies  . Heart  palpitations   . 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 Hypercholesterolemia   . Multiple sclerosis (HCC)    weakness  . Nephrolithiasis    kidney stones  . Osteoporosis    osteoarthritis  . Pneumonia   . PONV (postoperative nausea and vomiting)    no problem after cataract surgery  . Psoriasis   . Renal stone   . Shortness of breath dyspnea    wheezing  . Sleep apnea 2012   sleep study / slight, no interventions  . Urinary frequency      Social History   Socioeconomic History  . Marital status: Divorced    Spouse name: Not on file  . Number of children: 1  . Years of education: Not on file  . Highest education level: Not on file  Occupational History  . Occupation: Therapist, art Rep at [employer]  . Financial resource strain: Not on file  . Food insecurity:    Worry: Not on file    Inability: Not on file  . Transportation needs:    Medical: Not on file    Non-medical: Not on file  Tobacco Use  . Smoking status: Former Smoker    Years: 25.00    Types: Cigarettes    Last attempt to quit: 11/16/2012    Years since quitting: 6.2  . Smokeless tobacco: Never Used  . Tobacco comment: occasional use  Substance and Sexual Activity  . Alcohol use: No    Alcohol/week: 0.0 standard drinks  . Drug use: No    Types: Methylphenidate  . Sexual activity: Never  Lifestyle  . Physical activity:    Days per week: Not on file    Minutes per session: Not on file  . Stress: Not on file  Relationships  . Social connections:    Talks on phone: Not on file    Gets together: Not on file    Attends religious service: Not on file    Active member of club or organization: Not on file    Attends meetings of clubs or organizations: Not on file    Relationship status: Not on file  . Intimate partner violence:    Fear of current or ex partner: Not on file    Emotionally abused: Not on file    Physically abused: Not on file    Forced sexual  activity: Not on file  Other Topics Concern  . Not on file  Social History Narrative  . Not on file    Past Surgical History:  Procedure Laterality Date  . CATARACT EXTRACTION W/PHACO Left 05/21/2015   Procedure: CATARACT EXTRACTION PHACO AND INTRAOCULAR LENS PLACEMENT (IOC);  Surgeon: Birder Robson, MD;  Location: [facility];  Service: Ophthalmology;  Laterality: Left;  [place] 00:35 AP% 22.9 CDE 8.11 fluid pack lot #[id number]  . CATARACT EXTRACTION W/PHACO Right 06/04/2015   Procedure: CATARACT EXTRACTION PHACO AND INTRAOCULAR LENS PLACEMENT (IOC);  Surgeon: Birder Robson, MD;  Location: [facility];  Service: Ophthalmology;  Laterality: Right;  US:00:48 AP%: 10.5 CDE:5.08 Fluid lot #[id number]  . CYSTOSCOPY/URETEROSCOPY/HOLMIUM LASER/STENT PLACEMENT Bilateral 09/22/2016   Procedure: CYSTOSCOPY/URETEROSCOPY/HOLMIUM LASER/STENT PLACEMENT;  Surgeon: Hollice Espy, MD;  Location: [facility];  Service: Urology;  Laterality: Bilateral;  . EYE SURGERY  2015   tissue biopsy  . FOOT SURGERY  2015  . JOINT REPLACEMENT Left 2013   hip replacement  . LITHOTRIPSY    . PTOSIS REPAIR Bilateral 02/18/2016   Procedure: BILATERAL PTOSIS REPAIR UPPER EYELIDS;  Surgeon: Karle Starch, MD;  Location: [place];  Service: Ophthalmology;  Laterality: Bilateral;  LEAVE PT EARLY AM  . thumb surgery Right   . TONSILLECTOMY  1973    Family History  Problem Relation Age of Onset  . Cancer Father        Abdomen with mastasis  . Cancer Mother   . Heart disease Mother   . Kidney disease Neg Hx   . Bladder Cancer Neg Hx   . Prostate cancer Neg Hx   . Kidney cancer Neg Hx     Allergies  Allergen Reactions  . Albuterol Shortness Of Breath and Other (See Comments)    Makes pt feel jittery/ tacycardic  . Crestor [Rosuvastatin] Other (See Comments)    Joint pain, muscle pain, and hair loss  . Halcion [Triazolam] Other (See Comments)    Dizziness,headaches,bladder problems  . Levaquin [Levofloxacin In  D5w] Diarrhea and Itching    Shoulder pain  . Naproxen Sodium Swelling    Patient tolerates in small doses  . Tylenol [Acetaminophen] Swelling    Patient tolerates in small doses  . Cefaclor Other (See Comments)    Doesn't remember---unsure if actually allergic   . Diclofenac Sodium Other (See Comments)    "made very sick"  . Sulfa Antibiotics Itching    Unsure of reaction possibly itching  . Tramadol Itching and Nausea And Vomiting  . Aripiprazole Other (See Comments)    Muscle tension/cramping  . Ibuprofen Swelling    Patient tolerates in small doses    Current Outpatient Medications on File Prior to Visit  Medication Sig Dispense Refill  . ASPERCREME LIDOCAINE EX Apply 1 application topically 4 (four) times daily as needed (for pain.).     Marland Kitchen aspirin 325 MG tablet Take 325 mg by mouth every 4 (four) hours as needed for headache.     Marland Kitchen azelastine (ASTELIN) 0.1 % nasal spray PLACE 2 SPRAYS INTO BOTH NOSTRILS 2 (TWO) TIMES DAILY. 30 mL 0  . BIOTIN PO Take 200 mg by mouth daily.     . calcipotriene-betamethasone (TACLONEX) ointment Apply 1 application topically daily as needed (for psorasis).     . Cholecalciferol (VITAMIN D3) 10000 units capsule Take 30,000 Units by mouth daily.     . clobetasol cream (TEMOVATE) 2.42 % Apply 1 application topically 2 (two) times daily. 30 g 2  . cyanocobalamin (,VITAMIN B-12,) 1000 MCG/ML injection Inject 1 mL (1,000 mcg total) into the muscle every 3 (three) months. 1 mL 0  . diclofenac sodium (VOLTAREN) 1 % GEL Apply 4 g topically 4 (four) times daily. 500 g 5  . docusate sodium (COLACE) 100 MG capsule Take 1 capsule (100 mg total) by mouth 2 (two) times daily. 60 capsule 0  . DULoxetine (CYMBALTA) 20 MG capsule Take 2 capsules (40 mg total) by mouth at bedtime. 180 capsule 3  . HYDROcodone-acetaminophen (NORCO) 10-325 MG tablet Take 1 tablet by  mouth every 6 (six) hours as needed for moderate pain or severe pain. Supervising physician; Dr Eliezer Lofts; DEA# [id number] 120 tablet 0  . hydroquinone 4 % cream 1 APPLICATION TOPICALLY DAILY AS NEEDED FOR BLEMISHES.  3  . hydrOXYzine (VISTARIL) 50 MG capsule 1   q  Noon  1  q 6:00 pm  1  prn 90 capsule 5  . ketoconazole (NIZORAL) 2 % cream Apply 1 application topically daily. 30 g 0  . lamoTRIgine (LAMICTAL) 150 MG tablet Take 2 tablets (300 mg total) by mouth at bedtime. 60 tablet 5  . levalbuterol (XOPENEX HFA) 45 MCG/ACT inhaler Inhale 1-2 puffs into the lungs every 6 (six) hours as needed for wheezing. 1 Inhaler 0  . levalbuterol (XOPENEX) 1.25 MG/3ML nebulizer solution Take 1.25 mg by nebulization every 4 (four) hours as needed for wheezing or shortness of breath. 72 mL 0  . Lysine 1000 MG TABS Take 2,000-4,000 mg by mouth at bedtime. 2000 mg scheduled at bedtime and patient will take 4000 mg if she has outbreak    . Magnesium 500 MG TABS Take 1 tablet by mouth at bedtime.    Marland Kitchen neomycin-bacitracin-polymyxin (NEOSPORIN) [phone] ointment Apply 1 application topically 4 (four) times daily as needed (for cut/scrapes.).    Marland Kitchen nicotine (NICODERM CQ - DOSED IN MG/24 HOURS) 21 mg/24hr patch Place 1 patch (21 mg total) onto the skin daily. 28 patch 3  . pantoprazole (PROTONIX) 40 MG tablet Take 40 mg by mouth daily.    . phenazopyridine (AZO-TABS) 95 MG tablet Take 95 mg by mouth 3 (three) times daily as needed for pain.    . polyethylene glycol powder (GLYCOLAX/MIRALAX) powder MIX 17 GRAMS (1 CAPFUL) WITH 4-8 OZ OF LIQUID AND TAKE BY MOUTH TWICE DAILY AS NEEDED 527 g 0  . predniSONE (DELTASONE) 10 MG tablet Take 4 tablets (40 mg total) by mouth daily for 5 days. 20 tablet 0  . temazepam (RESTORIL) 30 MG capsule TAKE ONE TO TWO CAPSULES BY MOUTH NIGHTLY AT BEDTIME 60 capsule 4  . tiZANidine (ZANAFLEX) 4 MG tablet TAKE 2 TABLETS (8 MG TOTAL) BY MOUTH EVERY 6 (SIX) HOURS AS NEEDED FOR MUSCLE SPASMS. 720 tablet 0  . traZODone (DESYREL) 50 MG tablet Take 2 tablets (100 mg total) by mouth at bedtime.  60 tablet 7  . zolpidem (AMBIEN) 10 MG tablet Take 1 tablet (10 mg total) by mouth at bedtime. 30 tablet 5   No current facility-administered medications on file prior to visit.     LMP 08/23/2014    Objective:   Physical Exam  Constitutional: She is oriented to person, place, and time. She appears well-nourished.  Respiratory: Effort normal. No respiratory distress.  Neurological: She is alert and oriented to person, place, and time.  Skin: Skin is warm and dry.  Psychiatric: She has a normal mood and affect.           Assessment & Plan:

## 2019-02-10 NOTE — Patient Instructions (Signed)
Continue prednisone 40 mg daily as prescribed. Continue nebulized breathing treatments as needed. Continue Tylenol 500 mg at bedtime. Increase ibuprofen to 400 mg BID for fevers, body aches.  Go to the hospital if your breathing gets wose. Please call me if your fevers don't continue to improve and if you develop sinus pressure, increased shortness of breath.   It was a pleasure to see you today!

## 2019-02-13 MED ORDER — HYDROCODONE-ACETAMINOPHEN 10-325 MG PO TABS: 1.0000 | ORAL_TABLET | Freq: Four times a day (QID) | ORAL | 0 refills | Status: DC | PRN

## 2019-02-16 ENCOUNTER — Other Ambulatory Visit: Payer: Self-pay | Admitting: Primary Care

## 2019-02-16 DIAGNOSIS — J4520 Mild intermittent asthma, uncomplicated: Secondary | ICD-10-CM

## 2019-02-20 ENCOUNTER — Ambulatory Visit (INDEPENDENT_AMBULATORY_CARE_PROVIDER_SITE_OTHER): Payer: PPO | Admitting: Primary Care

## 2019-02-20 ENCOUNTER — Other Ambulatory Visit: Payer: Self-pay

## 2019-02-20 DIAGNOSIS — R21 Rash and other nonspecific skin eruption: Secondary | ICD-10-CM | POA: Insufficient documentation

## 2019-02-20 MED ORDER — TRIAMCINOLONE ACETONIDE 0.1 % EX CREA: 1.0000 "application " | TOPICAL_CREAM | Freq: Two times a day (BID) | CUTANEOUS | 0 refills | Status: DC

## 2019-02-20 NOTE — Assessment & Plan Note (Signed)
Widespread rash to thoracic back. Symptoms are not presentative of shingles, anaphylaxis, tick/insect bite. Recently on prednisone so we would like to avoid further prescriptions given history of hyperglycemia.  Suspect more contact dermatitis, will treat with topical steroid in an attempt to avoid prednisone/systetmic treatment. Also discussed Benadryl use. Rx for Triamcinolone cream sent to pharmacy. Continue Zyrtec, consider switching to Benadryl if needed. Continue other medications. She will update.

## 2019-02-20 NOTE — Progress Notes (Signed)
Subjective:    Patient ID: Emily Moon, female    DOB: [birth year], 58 y.o.   MRN: [id number]  HPI  Virtual Visit via Video Note  I connected with Emily Moon on 02/20/19 at 11:20 AM EDT by a video enabled telemedicine application and verified that I am speaking with the correct person using two identifiers.   I discussed the limitations of evaluation and management by telemedicine and the availability of in person appointments. The patient expressed understanding and agreed to proceed. She is at home, I am in the office.   History of Present Illness:  Emily Moon is a 58 year old female with a history of multiple sclerosis, bipolar disorder, chronic pain, asthma who presents today via video with a chief complaint of rash. She first noticed 2-3 days ago. Her rash is located to the upper cervical neck/thoracic back. Also with some rash to her left submandibular region. She started using a new body wash recently but her symptoms began prior.   Her rash is itchy. She denies new medications/food/laundry detergents. She continues to run a temperature of 100.3 since her URI symptoms began 2 weeks ago. She's tried taking Lysine, Zyrtec, hydroxyzine (presribed by psychiatry) and Astelin nasal spray without much improvement. She was recently on prednisone for which she took for 5 days.    Observations/Objective:  Widespread pruritic rash to posterior trunk (cervical to thoracic). No signs of cellulitis. No open wounds. Alert and oriented x 3.  Assessment and Plan:  Widespread rash to thoracic back. Symptoms are not presentative of shingles, anaphylaxis, tick/insect bite. Recently on prednisone so we would like to avoid further prescriptions given history of hyperglycemia.  Suspect more contact dermatitis, will treat with topical steroid in an attempt to avoid prednisone/systetmic treatment. Also discussed Benadryl use. Rx for Triamcinolone cream sent to pharmacy. Continue Zyrtec,  consider switching to Benadryl if needed. Continue other medications. She will update.   Follow Up Instructions:  Start triamcinolone ointment, apply twice daily for one week. Continue Zyrtec daily.  Please update me in a few days.  It was nice to see you! Allie Bossier, NP-C    I discussed the assessment and treatment plan with the patient. The patient was provided an opportunity to ask questions and all were answered. The patient agreed with the plan and demonstrated an understanding of the instructions.   The patient was advised to call back or seek an in-person evaluation if the symptoms worsen or if the condition fails to improve as anticipated.     [name], NP    Review of Systems  Respiratory: Negative for shortness of breath.   Cardiovascular: Negative for chest pain.  Skin: Positive for rash. Negative for wound.       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   Hypercholesterolemia    Multiple sclerosis (HCC)    weakness   Nephrolithiasis    kidney stones  Osteoporosis    osteoarthritis   Pneumonia    PONV (postoperative nausea and vomiting)    no problem after cataract surgery   Psoriasis    Renal stone    Shortness of breath dyspnea    wheezing   Sleep apnea 2012   sleep study / slight, no interventions   Urinary frequency      Social History   Socioeconomic History   Marital status: Divorced    Spouse name: Not on file   Number of children: 1   Years of education: Not on file   Highest  education level: Not on file  Occupational History   Occupation: Therapist, art Rep at [employer] strain: Not on file   Food insecurity:    Worry: Not on file    Inability: Not on file   Transportation needs:    Medical: Not on file    Non-medical: Not on file  Tobacco Use   Smoking status: Former Smoker    Years: 25.00    Types: Cigarettes    Last attempt to quit: 11/16/2012    Years since quitting: 6.2   Smokeless tobacco: Never Used   Tobacco comment: occasional use  Substance and Sexual Activity   Alcohol use: No    Alcohol/week: 0.0 standard drinks   Drug use: No    Types: Methylphenidate   Sexual activity: Never  Lifestyle   Physical activity:    Days per week: Not on file    Minutes per session: Not on file   Stress: Not on file  Relationships   Social connections:    Talks on phone: Not on file    Gets together: Not on file    Attends religious service: Not on file    Active member of club or organization: Not on file    Attends meetings of clubs or organizations: Not on file    Relationship status: Not on file   Intimate partner violence:    Fear of current or ex partner: Not on file    Emotionally abused: Not on file    Physically abused: Not on file    Forced sexual activity: Not on file  Other Topics Concern   Not on file  Social History Narrative   Not on file    Past Surgical History:  Procedure Laterality Date   CATARACT EXTRACTION W/PHACO Left 05/21/2015   Procedure: CATARACT EXTRACTION PHACO AND INTRAOCULAR LENS PLACEMENT ([place]);  Surgeon: Birder Robson, MD;  Location: [facility];  Service: Ophthalmology;  Laterality: Left;  [place] 00:35 AP% 22.9 CDE 8.11 fluid pack lot #[id number]   CATARACT EXTRACTION W/PHACO Right 06/04/2015   Procedure: CATARACT EXTRACTION PHACO AND INTRAOCULAR LENS PLACEMENT ([facility]);  Surgeon: Birder Robson, MD;  Location: [facility];  Service: Ophthalmology;   Laterality: Right;  US:00:48 AP%: 10.5 CDE:5.08 Fluid lot #[id number]   CYSTOSCOPY/URETEROSCOPY/HOLMIUM LASER/STENT PLACEMENT Bilateral 09/22/2016   Procedure: CYSTOSCOPY/URETEROSCOPY/HOLMIUM LASER/STENT PLACEMENT;  Surgeon: Hollice Espy, MD;  Location: [facility];  Service: Urology;  Laterality: Bilateral;   EYE SURGERY  2015   tissue biopsy   FOOT SURGERY  2015   JOINT REPLACEMENT Left 2013   hip replacement   LITHOTRIPSY     PTOSIS REPAIR Bilateral 02/18/2016   Procedure: BILATERAL PTOSIS REPAIR UPPER EYELIDS;  Surgeon: Karle Starch, MD;  Location: [place];  Service: Ophthalmology;  Laterality: Bilateral;  LEAVE PT EARLY AM   thumb surgery Right  TONSILLECTOMY  1973    Family History  Problem Relation Age of Onset   Cancer Father        Abdomen with mastasis   Cancer Mother    Heart disease Mother    Kidney disease Neg Hx    Bladder Cancer Neg Hx    Prostate cancer Neg Hx    Kidney cancer Neg Hx     Allergies  Allergen Reactions   Albuterol Shortness Of Breath and Other (See Comments)    Makes pt feel jittery/ tacycardic   Crestor [Rosuvastatin] Other (See Comments)    Joint pain, muscle pain, and hair loss   Halcion [Triazolam] Other (See Comments)    Dizziness,headaches,bladder problems   Levaquin [Levofloxacin In D5w] Diarrhea and Itching    Shoulder pain   Naproxen Sodium Swelling    Patient tolerates in small doses   Tylenol [Acetaminophen] Swelling    Patient tolerates in small doses   Cefaclor Other (See Comments)    Doesn't remember---unsure if actually allergic    Diclofenac Sodium Other (See Comments)    "made very sick"   Sulfa Antibiotics Itching    Unsure of reaction possibly itching   Tramadol Itching and Nausea And Vomiting   Aripiprazole Other (See Comments)    Muscle tension/cramping   Ibuprofen Swelling    Patient tolerates in small doses    Current Outpatient Medications on File Prior to Visit    Medication Sig Dispense Refill   ASPERCREME LIDOCAINE EX Apply 1 application topically 4 (four) times daily as needed (for pain.).      aspirin 325 MG tablet Take 325 mg by mouth every 4 (four) hours as needed for headache.      azelastine (ASTELIN) 0.1 % nasal spray PLACE 2 SPRAYS INTO BOTH NOSTRILS 2 (TWO) TIMES DAILY. 30 mL 0   BIOTIN PO Take 200 mg by mouth daily.      calcipotriene-betamethasone (TACLONEX) ointment Apply 1 application topically daily as needed (for psorasis).      Cholecalciferol (VITAMIN D3) 10000 units capsule Take 30,000 Units by mouth daily.      clobetasol cream (TEMOVATE) 9.89 % Apply 1 application topically 2 (two) times daily. 30 g 2   cyanocobalamin (,VITAMIN B-12,) 1000 MCG/ML injection Inject 1 mL (1,000 mcg total) into the muscle every 3 (three) months. 1 mL 0   diclofenac sodium (VOLTAREN) 1 % GEL Apply 4 g topically 4 (four) times daily. 500 g 5   docusate sodium (COLACE) 100 MG capsule Take 1 capsule (100 mg total) by mouth 2 (two) times daily. 60 capsule 0   DULoxetine (CYMBALTA) 20 MG capsule Take 2 capsules (40 mg total) by mouth at bedtime. 180 capsule 3   HYDROcodone-acetaminophen (NORCO) 10-325 MG tablet Take 1 tablet by mouth every 6 (six) hours as needed for moderate pain or severe pain. Supervising physician; Dr Eliezer Lofts; DEA# [id number] 120 tablet 0   hydroquinone 4 % cream 1 APPLICATION TOPICALLY DAILY AS NEEDED FOR BLEMISHES.  3   hydrOXYzine (VISTARIL) 50 MG capsule TAKE 1 CAPSLE BY MOUTH AT NOON, 1 AT 6 PM, AND 1 AS NEEDED 270 capsule 2   ketoconazole (NIZORAL) 2 % cream Apply 1 application topically daily. 30 g 0   lamoTRIgine (LAMICTAL) 150 MG tablet Take 2 tablets (300 mg total) by mouth at bedtime. 60 tablet 5   levalbuterol (XOPENEX HFA) 45 MCG/ACT inhaler Inhale 1-2 puffs into the lungs every 6 (six) hours as needed for wheezing. 1 Inhaler 0  levalbuterol (XOPENEX) 1.25 MG/3ML nebulizer solution USE 1 VIAL VIA NEBULIZER  EVERY 4 HOURS AS NEEDED FOR WHEEZING OR SHORTNESS OF BREATH 90 mL 0   Lysine 1000 MG TABS Take 2,000-4,000 mg by mouth at bedtime. 2000 mg scheduled at bedtime and patient will take 4000 mg if she has outbreak     Magnesium 500 MG TABS Take 1 tablet by mouth at bedtime.     neomycin-bacitracin-polymyxin (NEOSPORIN) [phone] ointment Apply 1 application topically 4 (four) times daily as needed (for cut/scrapes.).     nicotine (NICODERM CQ - DOSED IN MG/24 HOURS) 21 mg/24hr patch Place 1 patch (21 mg total) onto the skin daily. 28 patch 3   pantoprazole (PROTONIX) 40 MG tablet Take 40 mg by mouth daily.     phenazopyridine (AZO-TABS) 95 MG tablet Take 95 mg by mouth 3 (three) times daily as needed for pain.     polyethylene glycol powder (GLYCOLAX/MIRALAX) powder MIX 17 GRAMS (1 CAPFUL) WITH 4-8 OZ OF LIQUID AND TAKE BY MOUTH TWICE DAILY AS NEEDED 527 g 0   temazepam (RESTORIL) 30 MG capsule TAKE ONE TO TWO CAPSULES BY MOUTH NIGHTLY AT BEDTIME 60 capsule 4   tiZANidine (ZANAFLEX) 4 MG tablet TAKE 2 TABLETS (8 MG TOTAL) BY MOUTH EVERY 6 (SIX) HOURS AS NEEDED FOR MUSCLE SPASMS. 720 tablet 0   traZODone (DESYREL) 50 MG tablet Take 2 tablets (100 mg total) by mouth at bedtime. 60 tablet 7   zolpidem (AMBIEN) 10 MG tablet Take 1 tablet (10 mg total) by mouth at bedtime. 30 tablet 5   No current facility-administered medications on file prior to visit.     LMP 08/23/2014    Objective:   Physical Exam  Constitutional: She is oriented to person, place, and time. She appears well-nourished. She does not appear ill.  Respiratory: No respiratory distress.  No cough  Neurological: She is alert and oriented to person, place, and time.  Skin: Rash noted.  Pruritic appearing rash to cervical neck and thoracic back. No evidence of cellulitis, no open wounds, no vesicles.   Psychiatric: She has a normal mood and affect.           Assessment & Plan:

## 2019-02-20 NOTE — Patient Instructions (Signed)
Start triamcinolone ointment, apply twice daily for one week. Continue Zyrtec daily.  Please update me in a few days.  It was nice to see you! Allie Bossier, NP-C

## 2019-02-21 ENCOUNTER — Other Ambulatory Visit: Payer: Self-pay | Admitting: Primary Care

## 2019-02-21 DIAGNOSIS — J4520 Mild intermittent asthma, uncomplicated: Secondary | ICD-10-CM

## 2019-02-21 MED ORDER — LEVALBUTEROL HCL 1.25 MG/3ML IN NEBU: INHALATION_SOLUTION | RESPIRATORY_TRACT | 1 refills | Status: DC

## 2019-02-21 NOTE — Addendum Note (Signed)
Addended by: Jacqualin Combes on: 02/21/2019 01:41 PM   Modules accepted: Orders

## 2019-03-04 ENCOUNTER — Encounter: Payer: Self-pay | Admitting: Family Medicine

## 2019-03-09 ENCOUNTER — Other Ambulatory Visit: Payer: Self-pay

## 2019-03-09 ENCOUNTER — Ambulatory Visit (INDEPENDENT_AMBULATORY_CARE_PROVIDER_SITE_OTHER)
Admission: RE | Admit: 2019-03-09 | Discharge: 2019-03-09 | Disposition: A | Discharge status: Home / Self Care | Payer: PPO | Source: Ambulatory Visit | Attending: Family Medicine | Admitting: Family Medicine

## 2019-03-09 ENCOUNTER — Encounter: Payer: Self-pay | Admitting: Family Medicine

## 2019-03-09 ENCOUNTER — Ambulatory Visit (INDEPENDENT_AMBULATORY_CARE_PROVIDER_SITE_OTHER): Payer: PPO | Admitting: Family Medicine

## 2019-03-09 VITALS — BP 100/66 | HR 93 | Temp 98.9°F | Ht 69.0 in | Wt 192.5 lb

## 2019-03-09 DIAGNOSIS — M25521 Pain in right elbow: Secondary | ICD-10-CM

## 2019-03-09 DIAGNOSIS — S99911A Unspecified injury of right ankle, initial encounter: Secondary | ICD-10-CM | POA: Diagnosis not present

## 2019-03-09 DIAGNOSIS — Z79899 Other long term (current) drug therapy: Secondary | ICD-10-CM | POA: Diagnosis not present

## 2019-03-09 DIAGNOSIS — M7021 Olecranon bursitis, right elbow: Secondary | ICD-10-CM | POA: Diagnosis not present

## 2019-03-09 IMAGING — DX RIGHT ELBOW - COMPLETE 3+ VIEW
4 series · 4 of 4 positions shown · non-contrast
Comparison: [DATE]

CLINICAL DATA: Right elbow pain after fall.

EXAM:
RIGHT ELBOW - COMPLETE 3+ VIEW

[elbow ap]
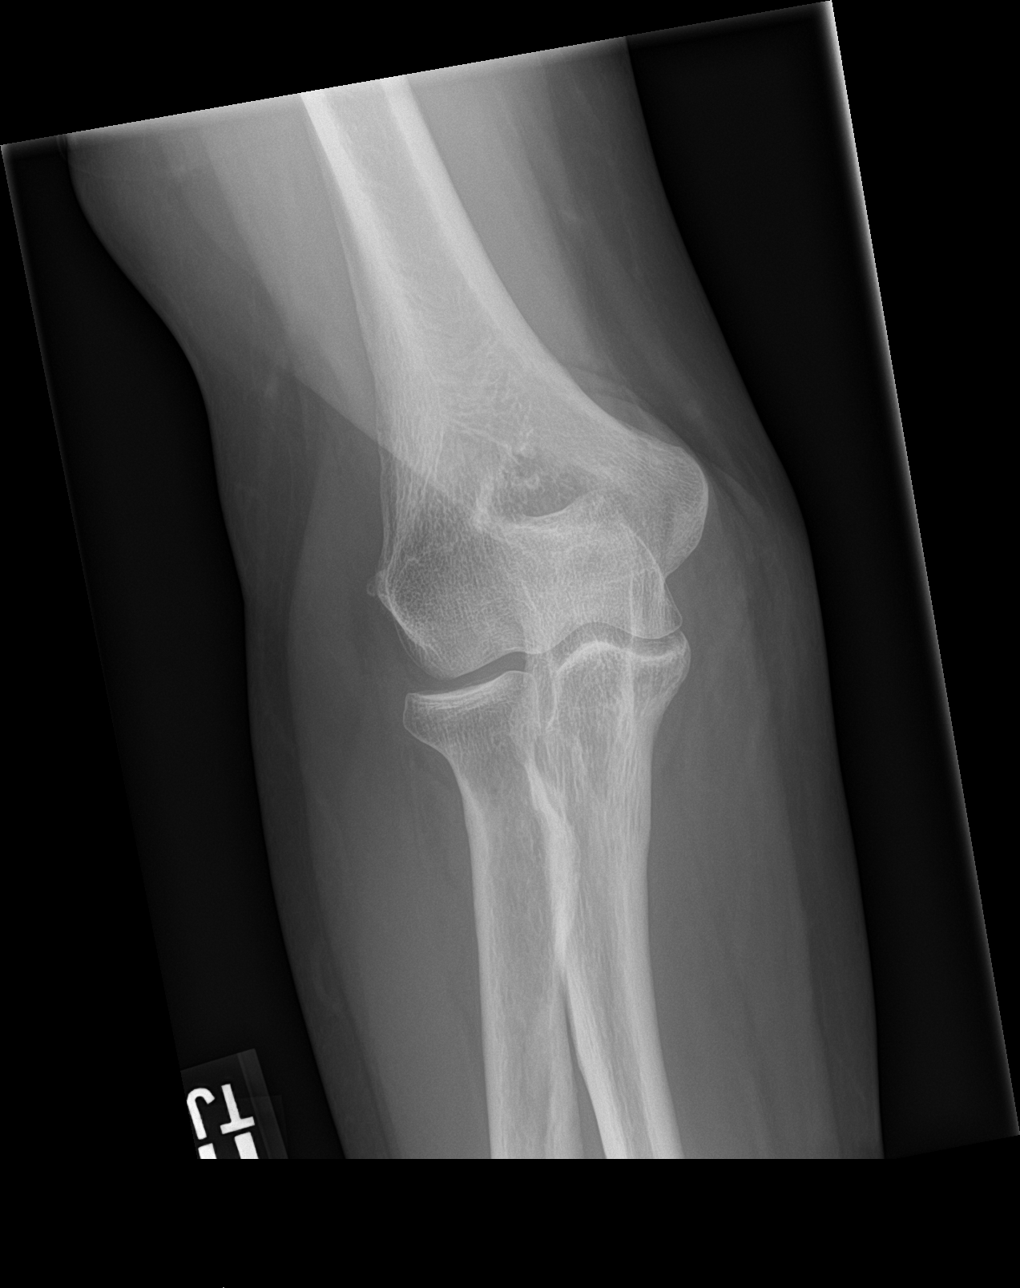

[elbow obl (1 of 2)]
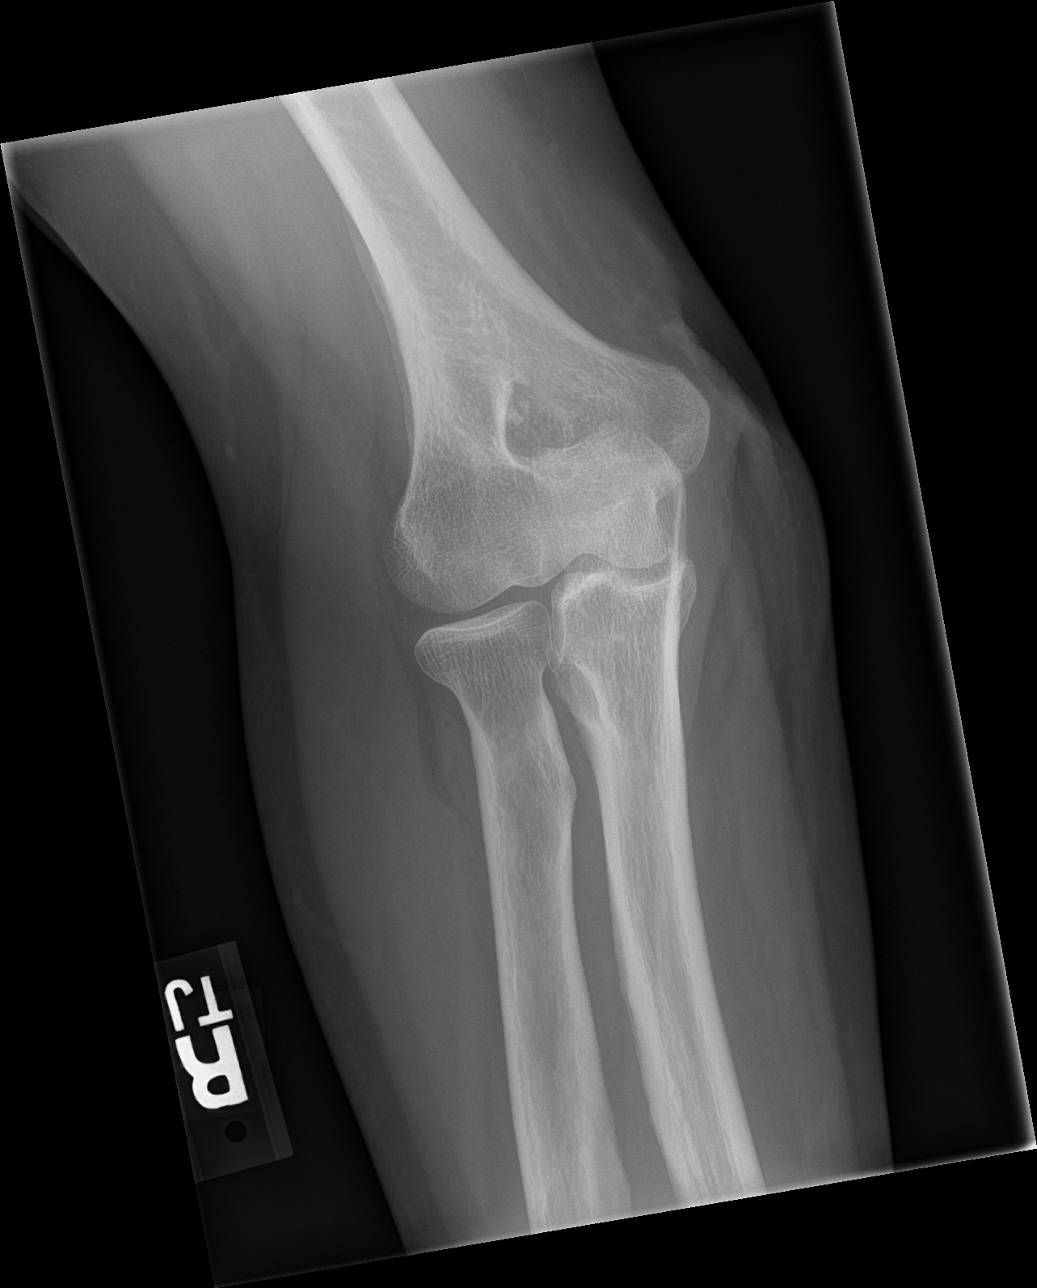

[elbow lat]
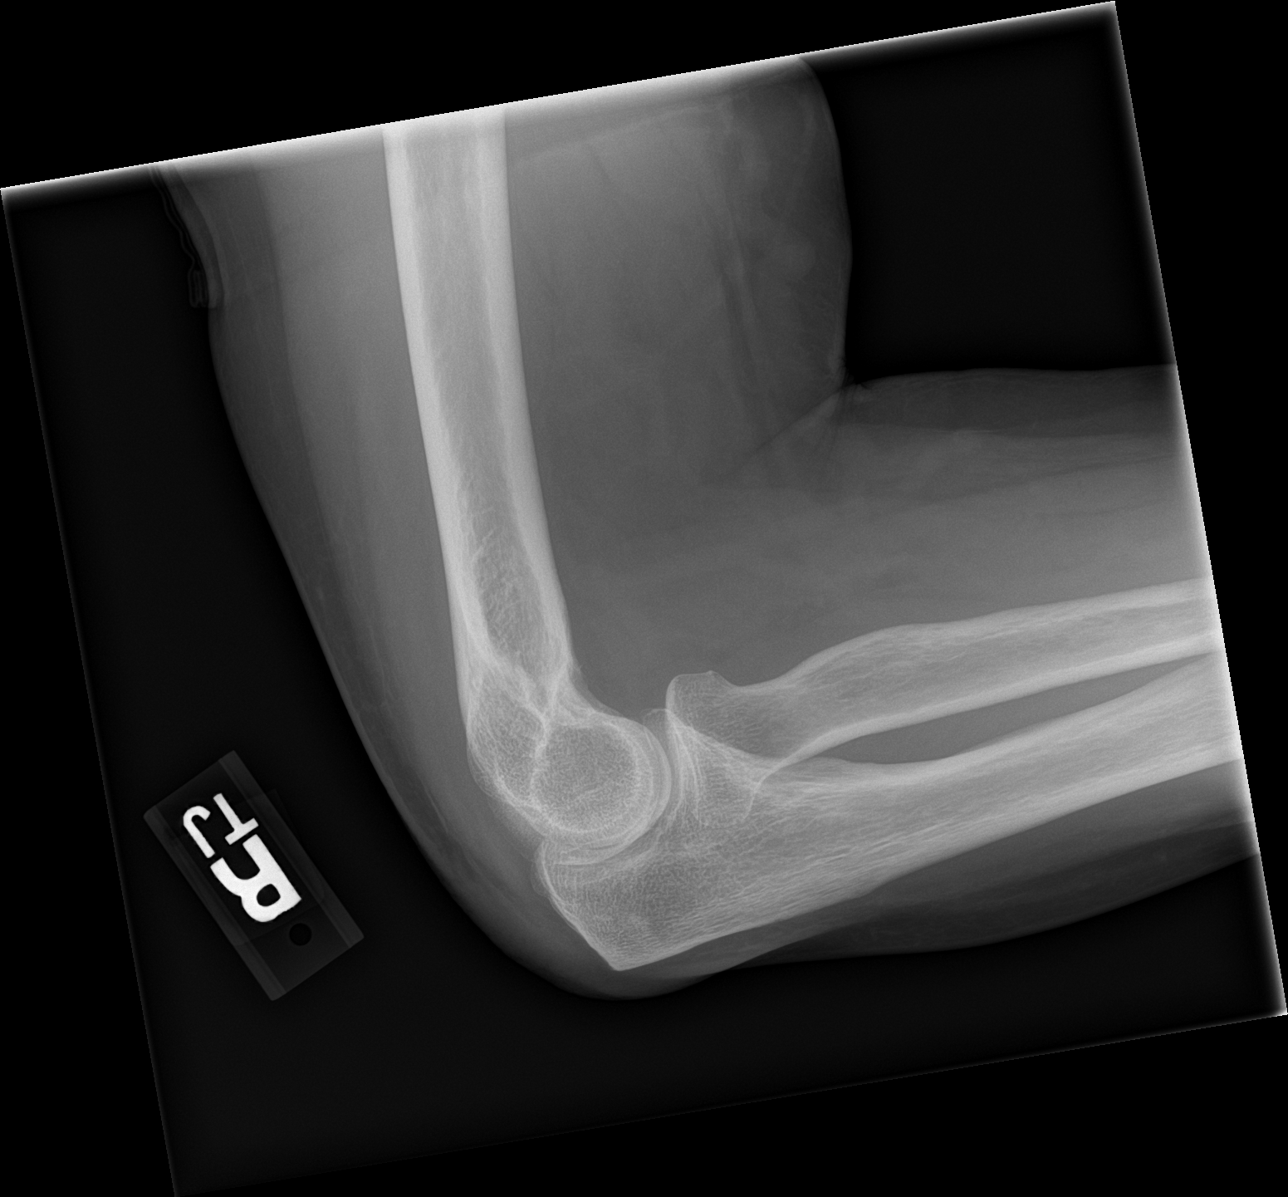

[elbow obl (2 of 2)]
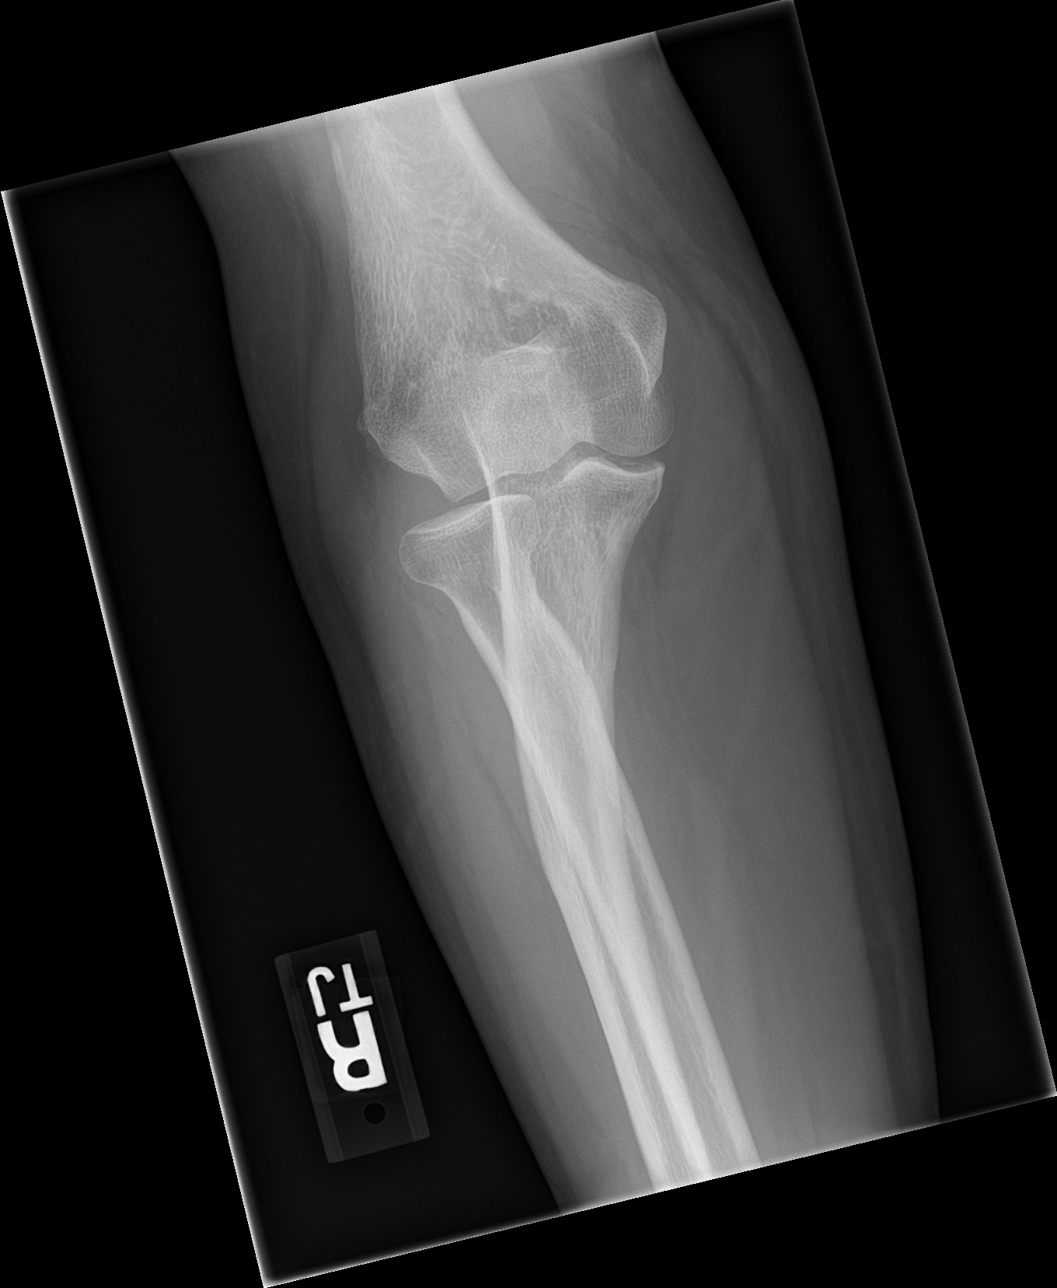

[4 of 4 positions shown; findings below may reference images not displayed]

FINDINGS: There is no evidence of fracture, dislocation, or joint effusion.
There is no evidence of arthropathy or other focal bone abnormality.
Soft tissues are unremarkable.
IMPRESSION: Negative.

## 2019-03-09 NOTE — Progress Notes (Signed)
[name] T. Jubal Rademaker, MD Primary Care and [place] at [facility] [place], [address] Phone: [phone]   FAX: [place] - 58 y.o. female   MRN [id number]   Date of Birth: [birth year]  Visit Date: 03/09/2019   PCP: [name], NP   Referred by: [name], NP  Chief Complaint  Patient presents with   Elbow Pain    Right   Subjective:   Emily Moon is a 58 y.o. very pleasant female patient who presents with the following:  F/u R elbow pain and trauma:  4th time falling recently.  She had a bad fall approximately 5 weeks ago and at that time she had some extensive bruising in her elbow, but her elbow films were negative.  She unfortunately fell again about 10 days ago which is the fourth time falling fairly recently and she landed on her elbow.  She actually woke up this time underneath her table and did not recall doing so after taking some Ambien.  She has some pain proximally at the radial head as well as some mild swelling at the olecranon bursa.  Past Medical History, Surgical History, Social History, Family History, Problem List, Medications, and Allergies have been reviewed and updated if relevant.  Patient Active Problem List   Diagnosis Date Noted   Bipolar disorder ([place]) 05/21/2014    Priority: High   MULTIPLE SCLEROSIS, PROGRESSIVE/RELAPSING 08/28/2010    Priority: High   Chronic pain syndrome 01/07/2011    Priority: Medium   Rash and nonspecific skin eruption 02/20/2019   Asthma 02/10/2019   Viral URI with cough 02/10/2019   Epigastric pain 12/21/2018   OSA (obstructive sleep apnea) 12/02/2018   Joint swelling 10/24/2018   Environmental and seasonal allergies 10/24/2018   Greater trochanteric bursitis of right hip 09/28/2018   Acute pain of right shoulder 09/28/2018   Prediabetes 07/22/2018   Bipolar I disorder, most recent episode (or current)  manic ([place]) 05/20/2018   Vitamin D deficiency 02/15/2018   Preventative health care 11/24/2017   Urinary frequency 03/30/2017   B12 deficiency 05/22/2016   Dizziness 03/16/2016   Medicare annual wellness visit, subsequent 10/25/2015   Back pain 10/31/2014   Deformity of right foot 12/27/2013   Hip dysplasia, 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 09/15/2013   Insomnia 09/01/2011   Obesity 05/04/2011   HLD (hyperlipidemia) 01/20/2011   Tobacco abuse 01/20/2011   RENAL CALCULUS, RECURRENT 11/13/2010   GERD 08/28/2010   Osteoarthritis 08/28/2010   NEPHROLITHIASIS, HX OF 08/28/2010    Past Medical History:  Diagnosis Date   Arthritis    osteo   Asthma    Bipolar disorder ([place]) 05/21/14   Cataracts, bilateral    DDD (degenerative disc disease), cervical    also back   Depression    Dizziness    Positional   Edema    feet/legs   Fibromyalgia syndrome    Fungal infection    Finger nails   GERD (gastroesophageal reflux disease)    Gout    Headache    seasonal allergies   Heart palpitations    Hip dysplasia, 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   Hypercholesterolemia    Multiple sclerosis (HCC)    weakness   Nephrolithiasis    kidney stones   Osteoporosis    osteoarthritis   Pneumonia    PONV (postoperative nausea and vomiting)    no problem after cataract  surgery   Psoriasis    Renal stone    Shortness of breath dyspnea    wheezing   Sleep apnea 2012   sleep study / slight, no interventions   Urinary frequency     Past Surgical History:  Procedure Laterality Date   CATARACT EXTRACTION W/PHACO Left 05/21/2015   Procedure: CATARACT EXTRACTION PHACO AND INTRAOCULAR LENS PLACEMENT ([place]);  Surgeon: Birder Robson, MD;  Location: [facility];  Service: Ophthalmology;  Laterality: Left;  [place] 00:35 AP% 22.9 CDE 8.11 fluid pack lot #[id number]   CATARACT EXTRACTION W/PHACO Right 06/04/2015   Procedure:  CATARACT EXTRACTION PHACO AND INTRAOCULAR LENS PLACEMENT ([facility]);  Surgeon: Birder Robson, MD;  Location: [facility];  Service: Ophthalmology;  Laterality: Right;  US:00:48 AP%: 10.5 CDE:5.08 Fluid lot #[id number]   CYSTOSCOPY/URETEROSCOPY/HOLMIUM LASER/STENT PLACEMENT Bilateral 09/22/2016   Procedure: CYSTOSCOPY/URETEROSCOPY/HOLMIUM LASER/STENT PLACEMENT;  Surgeon: Hollice Espy, MD;  Location: [facility];  Service: Urology;  Laterality: Bilateral;   EYE SURGERY  2015   tissue biopsy   FOOT SURGERY  2015   JOINT REPLACEMENT Left 2013   hip replacement   LITHOTRIPSY     PTOSIS REPAIR Bilateral 02/18/2016   Procedure: BILATERAL PTOSIS REPAIR UPPER EYELIDS;  Surgeon: Karle Starch, MD;  Location: [facility];  Service: Ophthalmology;  Laterality: Bilateral;  LEAVE PT EARLY AM   thumb surgery Right    TONSILLECTOMY  1973    Social History   Socioeconomic History   Marital status: Divorced    Spouse name: Not on file   Number of children: 1   Years of education: Not on file   Highest education level: Not on file  Occupational History   Occupation: Therapist, art Rep at [employer] resource strain: Not on file   Food insecurity:    Worry: Not on file    Inability: Not on file   Transportation needs:    Medical: Not on file    Non-medical: Not on file  Tobacco Use   Smoking status: Former Smoker    Years: 25.00    Types: Cigarettes    Last attempt to quit: 11/16/2012    Years since quitting: 6.3   Smokeless tobacco: Never Used   Tobacco comment: occasional use  Substance and Sexual Activity   Alcohol use: No    Alcohol/week: 0.0 standard drinks   Drug use: No    Types: Methylphenidate   Sexual activity: Never  Lifestyle   Physical activity:    Days per week: Not on file    Minutes per session: Not on file   Stress: Not on file  Relationships   Social connections:    Talks on phone: Not on file     Gets together: Not on file    Attends religious service: Not on file    Active member of club or organization: Not on file    Attends meetings of clubs or organizations: Not on file    Relationship status: Not on file   Intimate partner violence:    Fear of current or ex partner: Not on file    Emotionally abused: Not on file    Physically abused: Not on file    Forced sexual activity: Not on file  Other Topics Concern   Not on file  Social History Narrative   Not on file    Family History  Problem Relation Age of Onset   Cancer Father  Abdomen with mastasis   Cancer Mother    Heart disease Mother    Kidney disease Neg Hx    Bladder Cancer Neg Hx    Prostate cancer Neg Hx    Kidney cancer Neg Hx     Allergies  Allergen Reactions   Albuterol Shortness Of Breath and Other (See Comments)    Makes pt feel jittery/ tacycardic   Crestor [Rosuvastatin] Other (See Comments)    Joint pain, muscle pain, and hair loss   Halcion [Triazolam] Other (See Comments)    Dizziness,headaches,bladder problems   Levaquin [Levofloxacin In D5w] Diarrhea and Itching    Shoulder pain   Naproxen Sodium Swelling    Patient tolerates in small doses   Tylenol [Acetaminophen] Swelling    Patient tolerates in small doses   Cefaclor Other (See Comments)    Doesn't remember---unsure if actually allergic    Diclofenac Sodium Other (See Comments)    "made very sick"   Sulfa Antibiotics Itching    Unsure of reaction possibly itching   Tramadol Itching and Nausea And Vomiting   Aripiprazole Other (See Comments)    Muscle tension/cramping   Ibuprofen Swelling    Patient tolerates in small doses    Medication list reviewed and updated in full in [facility].  GEN: No fevers, chills. Nontoxic. Primarily MSK c/o today. MSK: Detailed in the HPI GI: tolerating PO intake without difficulty Neuro: No numbness, parasthesias, or tingling associated. Otherwise the  pertinent positives of the ROS are noted above.   Objective:   BP 100/66    Pulse 93    Temp 98.9 F (37.2 C) (Oral)    Ht 5\' 9"  (1.753 m)    Wt 192 lb 8 oz (87.3 kg)    LMP 08/23/2014    SpO2 98%    BMI 28.43 kg/m    GEN: WDWN, NAD, Non-toxic, Alert & Oriented x 3 HEENT: Atraumatic, Normocephalic.  Ears and Nose: No external deformity. EXTR: No clubbing/cyanosis/edema NEURO: Normal gait.  PSYCH: Normally interactive. Conversant. Not depressed or anxious appearing.  Calm demeanor.    Patient does have some bruising and tenderness to palpation at the radial head and radial neck.  She also has some mild swelling at the olecranon bursa.  She is able to fully extend the elbow and fully flex the elbow as well as has full supination and pronation.  Nontender at the medial and lateral malleoli.  Nontender along the humerus.  Nontender along the midshaft and distal radius, and nontender along the ulna in its entirety.  Radiology: Dg Elbow Complete Right (3+view)  Result Date: 03/09/2019 CLINICAL DATA:  Right elbow pain after fall. EXAM: RIGHT ELBOW - COMPLETE 3+ VIEW COMPARISON:  01/30/2019 FINDINGS: There is no evidence of fracture, dislocation, or joint effusion. There is no evidence of arthropathy or other focal bone abnormality. Soft tissues are unremarkable. IMPRESSION: Negative. Electronically Signed   By: Aletta Edouard M.D.   On: 03/09/2019 16:42     Assessment and Plan:   Elbow pain, right - Plan: DG ELBOW COMPLETE RIGHT (3+VIEW)  Olecranon bursitis of right elbow  Polypharmacy  I think that this is an entirely different injury than the one she had 5 weeks ago, and the bruising pattern and point of location is different.  She had a blacking out episode while taking Ambien and found herself underneath the table.  Full range of motion, bone contusion, soft tissue bruising.  X-rays are negative.  Basic range of motion.  The patient is going to voluntarily discontinue Ambien and  with needed take only a very small amount like a quarter of a tablet.  She is going to discuss this with her psychiatrist, and I think that this is a very important point in this patient with MS and who is already a fall risk.  Follow-up: prn  Orders Placed This Encounter  Procedures   DG ELBOW COMPLETE RIGHT (3+VIEW)    Signed,  Curtis Uriarte T. Shandrika Ambers, MD   Outpatient Encounter Medications as of 03/09/2019  Medication Sig   ASPERCREME LIDOCAINE EX Apply 1 application topically 4 (four) times daily as needed (for pain.).    aspirin 325 MG tablet Take 325 mg by mouth every 4 (four) hours as needed for headache.    azelastine (ASTELIN) 0.1 % nasal spray PLACE 2 SPRAYS INTO BOTH NOSTRILS 2 (TWO) TIMES DAILY.   BIOTIN PO Take 200 mg by mouth daily.    calcipotriene-betamethasone (TACLONEX) ointment Apply 1 application topically daily as needed (for psorasis).    Cholecalciferol (VITAMIN D3) 10000 units capsule Take 30,000 Units by mouth daily.    clobetasol cream (TEMOVATE) 4.78 % Apply 1 application topically 2 (two) times daily.   cyanocobalamin (,VITAMIN B-12,) 1000 MCG/ML injection Inject 1 mL (1,000 mcg total) into the muscle every 3 (three) months.   diclofenac sodium (VOLTAREN) 1 % GEL Apply 4 g topically 4 (four) times daily.   docusate sodium (COLACE) 100 MG capsule Take 1 capsule (100 mg total) by mouth 2 (two) times daily.   DULoxetine (CYMBALTA) 20 MG capsule Take 2 capsules (40 mg total) by mouth at bedtime.   HYDROcodone-acetaminophen (NORCO) 10-325 MG tablet Take 1 tablet by mouth every 6 (six) hours as needed for moderate pain or severe pain. Supervising physician; Dr Eliezer Lofts; DEA# [id number]   hydroquinone 4 % cream 1 APPLICATION TOPICALLY DAILY AS NEEDED FOR BLEMISHES.   hydrOXYzine (VISTARIL) 50 MG capsule TAKE 1 CAPSLE BY MOUTH AT NOON, 1 AT 6 PM, AND 1 AS NEEDED   ketoconazole (NIZORAL) 2 % cream Apply 1 application topically daily.   ketorolac (TORADOL)  10 MG tablet Take 10 mg by mouth every 6 (six) hours as needed.   lamoTRIgine (LAMICTAL) 150 MG tablet Take 2 tablets (300 mg total) by mouth at bedtime.   levalbuterol (XOPENEX HFA) 45 MCG/ACT inhaler Inhale 1-2 puffs into the lungs every 6 (six) hours as needed for wheezing.   levalbuterol (XOPENEX) 1.25 MG/3ML nebulizer solution USE 1 VIAL VIA NEBULIZER EVERY 4 HOURS AS NEEDED FOR WHEEZING OR SHORTNESS OF BREATH   Lysine 1000 MG TABS Take 2,000-4,000 mg by mouth at bedtime. 2000 mg scheduled at bedtime and patient will take 4000 mg if she has outbreak   Magnesium 500 MG TABS Take 1 tablet by mouth at bedtime.   neomycin-bacitracin-polymyxin (NEOSPORIN) [phone] ointment Apply 1 application topically 4 (four) times daily as needed (for cut/scrapes.).   nicotine (NICODERM CQ - DOSED IN MG/24 HOURS) 21 mg/24hr patch Place 1 patch (21 mg total) onto the skin daily.   pantoprazole (PROTONIX) 40 MG tablet Take 40 mg by mouth daily.   phenazopyridine (AZO-TABS) 95 MG tablet Take 95 mg by mouth 3 (three) times daily as needed for pain.   polyethylene glycol powder (GLYCOLAX/MIRALAX) powder MIX 17 GRAMS (1 CAPFUL) WITH 4-8 OZ OF LIQUID AND TAKE BY MOUTH TWICE DAILY AS NEEDED   temazepam (RESTORIL) 30 MG capsule TAKE ONE TO TWO CAPSULES BY MOUTH NIGHTLY AT BEDTIME   tiZANidine (  ZANAFLEX) 4 MG tablet TAKE 2 TABLETS (8 MG TOTAL) BY MOUTH EVERY 6 (SIX) HOURS AS NEEDED FOR MUSCLE SPASMS.   traZODone (DESYREL) 50 MG tablet Take 2 tablets (100 mg total) by mouth at bedtime.   triamcinolone cream (KENALOG) 0.1 % Apply 1 application topically 2 (two) times daily.   zolpidem (AMBIEN) 10 MG tablet Take 1 tablet (10 mg total) by mouth at bedtime.   No facility-administered encounter medications on file as of 03/09/2019.

## 2019-03-15 ENCOUNTER — Ambulatory Visit (INDEPENDENT_AMBULATORY_CARE_PROVIDER_SITE_OTHER): Payer: PPO | Admitting: Primary Care

## 2019-03-15 DIAGNOSIS — G894 Chronic pain syndrome: Secondary | ICD-10-CM | POA: Diagnosis not present

## 2019-03-15 DIAGNOSIS — M1631 Unilateral osteoarthritis resulting from hip dysplasia, right hip: Secondary | ICD-10-CM

## 2019-03-15 DIAGNOSIS — M1990 Unspecified osteoarthritis, unspecified site: Secondary | ICD-10-CM | POA: Diagnosis not present

## 2019-03-15 DIAGNOSIS — M159 Polyosteoarthritis, unspecified: Secondary | ICD-10-CM

## 2019-03-15 MED ORDER — HYDROCODONE-ACETAMINOPHEN 10-325 MG PO TABS: 1.0000 | ORAL_TABLET | Freq: Four times a day (QID) | ORAL | 0 refills | Status: DC | PRN

## 2019-03-15 NOTE — Assessment & Plan Note (Signed)
Compliant to Norco as prescribed and continues to experience benefit.  No suspicious activity noted on PMP aware. UDS and controlled substance contract due in July 2020. Continue medication as prescribed.  Will forward to supervising physician for review.

## 2019-03-15 NOTE — Patient Instructions (Signed)
Continue your current prescription as prescribed.   You are due for your urine drug screen in July 2020.  We will also renew your controlled substance contract.   Please schedule a follow up appointment in 3 months for chronic pain management.   It was a pleasure to see you today!

## 2019-03-15 NOTE — Progress Notes (Signed)
Subjective:    Patient ID: Emily Moon, female    DOB: [birth year], 58 y.o.   MRN: [id number]  HPI  Virtual Visit via Video Note  I connected with Emily Moon on 03/15/19 at  3:00 PM EDT by a video enabled telemedicine application and verified that I am speaking with the correct person using two identifiers.  Location: Patient: home Provider: office   I discussed the limitations of evaluation and management by telemedicine and the availability of in person appointments. The patient expressed understanding and agreed to proceed.  History of Present Illness:  Emily Moon is a 58 year old female who presents today for follow up of chronic pain and opioid management.  Indication for chronic opioid: Chronic osteoarthritis, hip dysplasia, chronic pain syndrome.  Medication and dose: Hydrocodone-Acetaminophen 10-325 mg q 6 hours for which she takes as prescribed.   # pills per month: 120  Last UDS date: 05/27/2018 Pain contract signed (Y/N): Due for updating at next visit.   Date narcotic database last reviewed (include red flags): 03/15/2019. No suspicious behavior.   She continues to expereince a benefit from her monthly prescription of Norco and would like to continue. She denies dizziness, falls, chest pain, shortness of breath (outside of asthma).    Observations/Objective:  Alert and oriented. Speaking in complete sentences. Appears well.   Assessment and Plan:  See problem based charting.  Follow Up Instructions:  Continue your current prescription as prescribed.   You are due for your urine drug screen in July 2020.  We will also renew your controlled substance contract.   Please schedule a follow up appointment in 3 months for chronic pain management.   It was a pleasure to see you today!    I discussed the assessment and treatment plan with the patient. The patient was provided an opportunity to ask questions and all were answered. The patient  agreed with the plan and demonstrated an understanding of the instructions.   The patient was advised to call back or seek an in-person evaluation if the symptoms worsen or if the condition fails to improve as anticipated.     Emily Koch, NP    Review of Systems  Eyes: Negative for visual disturbance.  Respiratory: Negative for shortness of breath.   Cardiovascular: Negative for chest pain.  Neurological: Negative for dizziness and headaches.       Past Medical History:  Diagnosis Date  . Arthritis    osteo  . Asthma   . Bipolar disorder ([place]) 05/21/14  . Cataracts, bilateral   . DDD (degenerative disc disease), cervical    also back  . Depression   . Dizziness    Positional  . Edema    feet/legs  . Fibromyalgia syndrome   . Fungal infection    Finger nails  . GERD (gastroesophageal reflux disease)   . Gout   . Headache    seasonal allergies  . Heart palpitations   . 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 Hypercholesterolemia   . Multiple sclerosis (HCC)    weakness  . Nephrolithiasis    kidney stones  . Osteoporosis    osteoarthritis  . Pneumonia   . PONV (postoperative nausea and vomiting)    no problem after cataract surgery  . Psoriasis   . Renal stone   . Shortness of breath dyspnea    wheezing  . Sleep apnea 2012   sleep study / slight, no interventions  . Urinary frequency  Social History   Socioeconomic History  . Marital status: Divorced    Spouse name: Not on file  . Number of children: 1  . Years of education: Not on file  . Highest education level: Not on file  Occupational History  . Occupation: Therapist, art Rep at [employer]  . Financial resource strain: Not on file  . Food insecurity:    Worry: Not on file    Inability: Not on file  . Transportation needs:    Medical: Not on file    Non-medical: Not on file  Tobacco Use  . Smoking status: Former Smoker    Years: 25.00     Types: Cigarettes    Last attempt to quit: 11/16/2012    Years since quitting: 6.3  . Smokeless tobacco: Never Used  . Tobacco comment: occasional use  Substance and Sexual Activity  . Alcohol use: No    Alcohol/week: 0.0 standard drinks  . Drug use: No    Types: Methylphenidate  . Sexual activity: Never  Lifestyle  . Physical activity:    Days per week: Not on file    Minutes per session: Not on file  . Stress: Not on file  Relationships  . Social connections:    Talks on phone: Not on file    Gets together: Not on file    Attends religious service: Not on file    Active member of club or organization: Not on file    Attends meetings of clubs or organizations: Not on file    Relationship status: Not on file  . Intimate partner violence:    Fear of current or ex partner: Not on file    Emotionally abused: Not on file    Physically abused: Not on file    Forced sexual activity: Not on file  Other Topics Concern  . Not on file  Social History Narrative  . Not on file    Past Surgical History:  Procedure Laterality Date  . CATARACT EXTRACTION W/PHACO Left 05/21/2015   Procedure: CATARACT EXTRACTION PHACO AND INTRAOCULAR LENS PLACEMENT (IOC);  Surgeon: Birder Robson, MD;  Location: [facility];  Service: Ophthalmology;  Laterality: Left;  [place] 00:35 AP% 22.9 CDE 8.11 fluid pack lot #[id number]  . CATARACT EXTRACTION W/PHACO Right 06/04/2015   Procedure: CATARACT EXTRACTION PHACO AND INTRAOCULAR LENS PLACEMENT (IOC);  Surgeon: Birder Robson, MD;  Location: [facility];  Service: Ophthalmology;  Laterality: Right;  US:00:48 AP%: 10.5 CDE:5.08 Fluid lot #[id number]  . CYSTOSCOPY/URETEROSCOPY/HOLMIUM LASER/STENT PLACEMENT Bilateral 09/22/2016   Procedure: CYSTOSCOPY/URETEROSCOPY/HOLMIUM LASER/STENT PLACEMENT;  Surgeon: Hollice Espy, MD;  Location: [facility];  Service: Urology;  Laterality: Bilateral;  . EYE SURGERY  2015   tissue biopsy  . FOOT SURGERY  2015  . JOINT REPLACEMENT Left  2013   hip replacement  . LITHOTRIPSY    . PTOSIS REPAIR Bilateral 02/18/2016   Procedure: BILATERAL PTOSIS REPAIR UPPER EYELIDS;  Surgeon: Karle Starch, MD;  Location: [facility];  Service: Ophthalmology;  Laterality: Bilateral;  LEAVE PT EARLY AM  . thumb surgery Right   . TONSILLECTOMY  1973    Family History  Problem Relation Age of Onset  . Cancer Father        Abdomen with mastasis  . Cancer Mother   . Heart disease Mother   . Kidney disease Neg Hx   . Bladder Cancer Neg Hx   . Prostate cancer Neg Hx   . Kidney  cancer Neg Hx     Allergies  Allergen Reactions  . Albuterol Shortness Of Breath and Other (See Comments)    Makes pt feel jittery/ tacycardic  . Crestor [Rosuvastatin] Other (See Comments)    Joint pain, muscle pain, and hair loss  . Halcion [Triazolam] Other (See Comments)    Dizziness,headaches,bladder problems  . Levaquin [Levofloxacin In D5w] Diarrhea and Itching    Shoulder pain  . Naproxen Sodium Swelling    Patient tolerates in small doses  . Tylenol [Acetaminophen] Swelling    Patient tolerates in small doses  . Cefaclor Other (See Comments)    Doesn't remember---unsure if actually allergic   . Diclofenac Sodium Other (See Comments)    "made very sick"  . Sulfa Antibiotics Itching    Unsure of reaction possibly itching  . Tramadol Itching and Nausea And Vomiting  . Aripiprazole Other (See Comments)    Muscle tension/cramping  . Ibuprofen Swelling    Patient tolerates in small doses    Current Outpatient Medications on File Prior to Visit  Medication Sig Dispense Refill  . ASPERCREME LIDOCAINE EX Apply 1 application topically 4 (four) times daily as needed (for pain.).     Marland Kitchen aspirin 325 MG tablet Take 325 mg by mouth every 4 (four) hours as needed for headache.     Marland Kitchen azelastine (ASTELIN) 0.1 % nasal spray PLACE 2 SPRAYS INTO BOTH NOSTRILS 2 (TWO) TIMES DAILY. 30 mL 0  . BIOTIN PO Take 200 mg by mouth daily.     .  calcipotriene-betamethasone (TACLONEX) ointment Apply 1 application topically daily as needed (for psorasis).     . Cholecalciferol (VITAMIN D3) 10000 units capsule Take 30,000 Units by mouth daily.     . clobetasol cream (TEMOVATE) 2.22 % Apply 1 application topically 2 (two) times daily. 30 g 2  . cyanocobalamin (,VITAMIN B-12,) 1000 MCG/ML injection Inject 1 mL (1,000 mcg total) into the muscle every 3 (three) months. 1 mL 0  . diclofenac sodium (VOLTAREN) 1 % GEL Apply 4 g topically 4 (four) times daily. 500 g 5  . docusate sodium (COLACE) 100 MG capsule Take 1 capsule (100 mg total) by mouth 2 (two) times daily. 60 capsule 0  . DULoxetine (CYMBALTA) 20 MG capsule Take 2 capsules (40 mg total) by mouth at bedtime. 180 capsule 3  . HYDROcodone-acetaminophen (NORCO) 10-325 MG tablet Take 1 tablet by mouth every 6 (six) hours as needed for moderate pain or severe pain. Supervising physician; Dr Eliezer Lofts; DEA# [id number] 120 tablet 0  . hydroquinone 4 % cream 1 APPLICATION TOPICALLY DAILY AS NEEDED FOR BLEMISHES.  3  . hydrOXYzine (VISTARIL) 50 MG capsule TAKE 1 CAPSLE BY MOUTH AT NOON, 1 AT 6 PM, AND 1 AS NEEDED 270 capsule 2  . ketoconazole (NIZORAL) 2 % cream Apply 1 application topically daily. 30 g 0  . ketorolac (TORADOL) 10 MG tablet Take 10 mg by mouth every 6 (six) hours as needed.    . lamoTRIgine (LAMICTAL) 150 MG tablet Take 2 tablets (300 mg total) by mouth at bedtime. 60 tablet 5  . levalbuterol (XOPENEX HFA) 45 MCG/ACT inhaler Inhale 1-2 puffs into the lungs every 6 (six) hours as needed for wheezing. 1 Inhaler 0  . levalbuterol (XOPENEX) 1.25 MG/3ML nebulizer solution USE 1 VIAL VIA NEBULIZER EVERY 4 HOURS AS NEEDED FOR WHEEZING OR SHORTNESS OF BREATH 270 mL 1  . Lysine 1000 MG TABS Take 2,000-4,000 mg by mouth at bedtime. 2000 mg scheduled at  bedtime and patient will take 4000 mg if she has outbreak    . Magnesium 500 MG TABS Take 1 tablet by mouth at bedtime.    Marland Kitchen  neomycin-bacitracin-polymyxin (NEOSPORIN) [phone] ointment Apply 1 application topically 4 (four) times daily as needed (for cut/scrapes.).    Marland Kitchen nicotine (NICODERM CQ - DOSED IN MG/24 HOURS) 21 mg/24hr patch Place 1 patch (21 mg total) onto the skin daily. 28 patch 3  . pantoprazole (PROTONIX) 40 MG tablet Take 40 mg by mouth daily.    . phenazopyridine (AZO-TABS) 95 MG tablet Take 95 mg by mouth 3 (three) times daily as needed for pain.    . polyethylene glycol powder (GLYCOLAX/MIRALAX) powder MIX 17 GRAMS (1 CAPFUL) WITH 4-8 OZ OF LIQUID AND TAKE BY MOUTH TWICE DAILY AS NEEDED 527 g 0  . temazepam (RESTORIL) 30 MG capsule TAKE ONE TO TWO CAPSULES BY MOUTH NIGHTLY AT BEDTIME 60 capsule 4  . tiZANidine (ZANAFLEX) 4 MG tablet TAKE 2 TABLETS (8 MG TOTAL) BY MOUTH EVERY 6 (SIX) HOURS AS NEEDED FOR MUSCLE SPASMS. 720 tablet 0  . traZODone (DESYREL) 50 MG tablet Take 2 tablets (100 mg total) by mouth at bedtime. 60 tablet 7  . triamcinolone cream (KENALOG) 0.1 % Apply 1 application topically 2 (two) times daily. 30 g 0  . zolpidem (AMBIEN) 10 MG tablet Take 1 tablet (10 mg total) by mouth at bedtime. 30 tablet 5   No current facility-administered medications on file prior to visit.     LMP 08/23/2014    Objective:   Physical Exam  Constitutional: She is oriented to person, place, and time. She appears well-nourished.  Respiratory: Effort normal. No respiratory distress.  Neurological: She is oriented to person, place, and time.  Skin: Skin is dry.  Psychiatric: She has a normal mood and affect.           Assessment & Plan:

## 2019-03-16 ENCOUNTER — Other Ambulatory Visit: Payer: Self-pay | Admitting: Primary Care

## 2019-03-16 DIAGNOSIS — J3089 Other allergic rhinitis: Secondary | ICD-10-CM

## 2019-04-05 ENCOUNTER — Ambulatory Visit (INDEPENDENT_AMBULATORY_CARE_PROVIDER_SITE_OTHER): Payer: PPO | Admitting: Psychiatry

## 2019-04-05 ENCOUNTER — Ambulatory Visit (HOSPITAL_COMMUNITY): Payer: PPO | Admitting: Psychiatry

## 2019-04-05 ENCOUNTER — Other Ambulatory Visit: Payer: Self-pay

## 2019-04-05 DIAGNOSIS — F3162 Bipolar disorder, current episode mixed, moderate: Secondary | ICD-10-CM

## 2019-04-05 MED ORDER — TRAZODONE HCL 50 MG PO TABS: 100.0000 mg | ORAL_TABLET | Freq: Every day | ORAL | 7 refills | Status: DC

## 2019-04-05 MED ORDER — TEMAZEPAM 30 MG PO CAPS: ORAL_CAPSULE | ORAL | 4 refills | Status: DC

## 2019-04-05 MED ORDER — DULOXETINE HCL 20 MG PO CPEP: 40.0000 mg | ORAL_CAPSULE | Freq: Every day | ORAL | 3 refills | Status: DC

## 2019-04-05 MED ORDER — HYDROXYZINE PAMOATE 50 MG PO CAPS: ORAL_CAPSULE | ORAL | 2 refills | Status: DC

## 2019-04-05 MED ORDER — LAMOTRIGINE 150 MG PO TABS: 300.0000 mg | ORAL_TABLET | Freq: Every day | ORAL | 5 refills | Status: DC

## 2019-04-05 NOTE — Progress Notes (Signed)
Psychiatric Initial Adult Assessment   Patient Identification: Emily Moon MRN:  [id number] Date of Evaluation:  04/05/2019 Referral Source: From the community Chief Complaint: Feels exhausted Visit Diagno bipolar disorder command  This patient has been seen in this office for a number of years diagnosed with bipolar disorder.  I believe it is a mixed presentation and likely associated a lot with a characterological predominance.  Medically the patient is not doing all that well.  She is having extensive dental surgery.  She is in a lot of pain.  For reasons that are not clear for her she fainted in the last week.  This was during the day.  Overall though the patient denies any change in her mood status other than feeling depressed because of the isolation.  She still has a number of friends to call her on a regular basis and her son from [place] makes contact each day.  The patient does get some enjoyment out of some things.  Her appetite is reasonably good.  Her energy is good.  She can think and concentrate without the problems.  At this time she chooses not to take anymore Ambien unless she absolutely needs it on a as needed basis.  She is not sure how well it works.  Nonetheless she will continue all her other medications.  She denies any chest pain or shortness of breath.  She denies any coughing or fever.  Patient denies any neurological symptoms.  She denies any recurrence of MS-like symptoms.  The patient is talkative and very engaging.   Patient: Emily Moon  Procedure(s) Performed: * No surgery found *  Anesthes  Patient location:   Post pain:   Post assessment:   Last Vitals:  There were no vitals filed for this visit.  Post vital signs:   Level of consciousness:   Complications: . Associated Signs/Symptoms: Depression Symptoms:  hopelessness, (Hypo) Manic Symptoms:   Anxiety Symptoms:   Psychotic Symptoms:   PTSD Symptoms:   Past Psychiatric History:  The patient was seen in a psychiatric office and takes multiple medications she's not been recently hospitalized.  Previous Psychotropic Medications: Yes   Substance Abuse History in the last 12 months:  No.  Consequences of Substance Abuse: Negative  Past Medical History:  Past Medical History:  Diagnosis Date  . Arthritis    osteo  . Asthma   . Bipolar disorder ([place]) 05/21/14  . Cataracts, bilateral   . DDD (degenerative disc disease), cervical    also back  . Depression   . Dizziness    Positional  . Edema    feet/legs  . Fibromyalgia syndrome   . Fungal infection    Finger nails  . GERD (gastroesophageal reflux disease)   . Gout   . Headache    seasonal allergies  . Heart palpitations   . 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 Hypercholesterolemia   . Multiple sclerosis (HCC)    weakness  . Nephrolithiasis    kidney stones  . Osteoporosis    osteoarthritis  . Pneumonia   . PONV (postoperative nausea and vomiting)    no problem after cataract surgery  . Psoriasis   . Renal stone   . Shortness of breath dyspnea    wheezing  . Sleep apnea 2012   sleep study / slight, no interventions  . Urinary frequency     Past Surgical History:  Procedure Laterality Date  . CATARACT EXTRACTION W/PHACO Left 05/21/2015   Procedure: CATARACT EXTRACTION  PHACO AND INTRAOCULAR LENS PLACEMENT (IOC);  Surgeon: Birder Robson, MD;  Location: [facility];  Service: Ophthalmology;  Laterality: Left;  [place] 00:35 AP% 22.9 CDE 8.11 fluid pack lot #[id number]  . CATARACT EXTRACTION W/PHACO Right 06/04/2015   Procedure: CATARACT EXTRACTION PHACO AND INTRAOCULAR LENS PLACEMENT (IOC);  Surgeon: Birder Robson, MD;  Location: [facility];  Service: Ophthalmology;  Laterality: Right;  US:00:48 AP%: 10.5 CDE:5.08 Fluid lot #[id number]  . CYSTOSCOPY/URETEROSCOPY/HOLMIUM LASER/STENT PLACEMENT Bilateral 09/22/2016   Procedure: CYSTOSCOPY/URETEROSCOPY/HOLMIUM LASER/STENT PLACEMENT;  Surgeon: Hollice Espy, MD;  Location: [facility];  Service: Urology;  Laterality: Bilateral;  . EYE SURGERY  2015   tissue biopsy  . FOOT SURGERY  2015  . JOINT REPLACEMENT Left 2013   hip replacement  . LITHOTRIPSY    . PTOSIS REPAIR Bilateral 02/18/2016   Procedure: BILATERAL PTOSIS REPAIR UPPER EYELIDS;  Surgeon: Karle Starch, MD;  Location: [place];  Service: Ophthalmology;  Laterality: Bilateral;  LEAVE PT EARLY AM  . thumb surgery Right   . TONSILLECTOMY  1973    Family Psychiatric History:   Family History:  Family History  Problem Relation Age of Onset  . Cancer Father        Abdomen with mastasis  . Cancer Mother   . Heart disease Mother   . Kidney disease Neg Hx   . Bladder Cancer Neg Hx   . Prostate cancer Neg Hx   . Kidney cancer Neg Hx     Social History:   Social History   Socioeconomic History  . Marital status: Divorced    Spouse name: Not on file  . Number of children: 1  . Years of education: Not on file  . Highest education level: Not on file  Occupational History  . 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 Financial resource strain: Not on file  . Food insecurity:    Worry: Not on file    Inability: Not on file  . Transportation needs:    Medical: Not on file    Non-medical: Not on file  Tobacco Use  . Smoking status: Former Smoker    Years: 25.00    Types: Cigarettes    Last attempt to quit: 11/16/2012    Years since quitting: 6.3  . Smokeless tobacco: Never Used  . Tobacco comment: occasional use  Substance and Sexual Activity  . Alcohol use: No    Alcohol/week: 0.0 standard drinks  . Drug use: No    Types: Methylphenidate  . Sexual activity: Never  Lifestyle  . Physical activity:    Days per week: Not on file    Minutes per session: Not on file  . Stress: Not on file  Relationships  . Social connections:    Talks on phone: Not on file    Gets together: Not on file    Attends religious  service: Not on file    Active member of club or organization: Not on file    Attends meetings of clubs or organizations: Not on file    Relationship status: Not on file  Other Topics Concern  . Not on file  Social History Narrative  . Not on file    Additional Social History:   Allergies:   Allergies  Allergen Reactions  . Albuterol Shortness Of Breath and Other (See Comments)    Makes pt feel jittery/ tacycardic  . Crestor [Rosuvastatin] Other (See Comments)    Joint pain,  muscle pain, and hair loss  . Halcion [Triazolam] Other (See Comments)    Dizziness,headaches,bladder problems  . Levaquin [Levofloxacin In D5w] Diarrhea and Itching    Shoulder pain  . Naproxen Sodium Swelling    Patient tolerates in small doses  . Tylenol [Acetaminophen] Swelling    Patient tolerates in small doses  . Cefaclor Other (See Comments)    Doesn't remember---unsure if actually allergic   . Diclofenac Sodium Other (See Comments)    "made very sick"  . Sulfa Antibiotics Itching    Unsure of reaction possibly itching  . Tramadol Itching and Nausea And Vomiting  . Aripiprazole Other (See Comments)    Muscle tension/cramping  . Ibuprofen Swelling    Patient tolerates in small doses    Metabolic Disorder Labs: Lab Results  Component Value Date   HGBA1C 6.0 11/24/2018   No results found for: PROLACTIN Lab Results  Component Value Date   CHOL 318 (H) 11/24/2018   TRIG 281.0 (H) 11/24/2018   HDL 63.60 11/24/2018   CHOLHDL 5 11/24/2018   VLDL 56.2 (H) 11/24/2018   LDLCALC 223 (H) 11/13/2014     Current Medications: Current Outpatient Medications  Medication Sig Dispense Refill  . ASPERCREME LIDOCAINE EX Apply 1 application topically 4 (four) times daily as needed (for pain.).     Marland Kitchen aspirin 325 MG tablet Take 325 mg by mouth every 4 (four) hours as needed for headache.     Marland Kitchen azelastine (ASTELIN) 0.1 % nasal spray PLACE 2 SPRAYS INTO BOTH NOSTRILS 2 (TWO) TIMES DAILY. 30 mL 0  .  BIOTIN PO Take 200 mg by mouth daily.     . calcipotriene-betamethasone (TACLONEX) ointment Apply 1 application topically daily as needed (for psorasis).     . Cholecalciferol (VITAMIN D3) 10000 units capsule Take 30,000 Units by mouth daily.     . clobetasol cream (TEMOVATE) 6.72 % Apply 1 application topically 2 (two) times daily. 30 g 2  . cyanocobalamin (,VITAMIN B-12,) 1000 MCG/ML injection Inject 1 mL (1,000 mcg total) into the muscle every 3 (three) months. 1 mL 0  . diclofenac sodium (VOLTAREN) 1 % GEL Apply 4 g topically 4 (four) times daily. 500 g 5  . docusate sodium (COLACE) 100 MG capsule Take 1 capsule (100 mg total) by mouth 2 (two) times daily. 60 capsule 0  . DULoxetine (CYMBALTA) 20 MG capsule Take 2 capsules (40 mg total) by mouth at bedtime. 180 capsule 3  . HYDROcodone-acetaminophen (NORCO) 10-325 MG tablet Take 1 tablet by mouth every 6 (six) hours as needed for moderate pain or severe pain. Supervising physician; Dr Eliezer Lofts; DEA# [id number] 120 tablet 0  . hydroquinone 4 % cream 1 APPLICATION TOPICALLY DAILY AS NEEDED FOR BLEMISHES.  3  . hydrOXYzine (VISTARIL) 50 MG capsule TAKE 1 CAPSLE BY MOUTH AT NOON, 1 AT 6 PM, AND 1 AS NEEDED 270 capsule 2  . ketoconazole (NIZORAL) 2 % cream Apply 1 application topically daily. 30 g 0  . ketorolac (TORADOL) 10 MG tablet Take 10 mg by mouth every 6 (six) hours as needed.    . lamoTRIgine (LAMICTAL) 150 MG tablet Take 2 tablets (300 mg total) by mouth at bedtime. 60 tablet 5  . levalbuterol (XOPENEX HFA) 45 MCG/ACT inhaler Inhale 1-2 puffs into the lungs every 6 (six) hours as needed for wheezing. 1 Inhaler 0  . levalbuterol (XOPENEX) 1.25 MG/3ML nebulizer solution USE 1 VIAL VIA NEBULIZER EVERY 4 HOURS AS NEEDED FOR WHEEZING OR SHORTNESS OF  BREATH 270 mL 1  . Lysine 1000 MG TABS Take 2,000-4,000 mg by mouth at bedtime. 2000 mg scheduled at bedtime and patient will take 4000 mg if she has outbreak    . Magnesium 500 MG TABS Take 1  tablet by mouth at bedtime.    Marland Kitchen neomycin-bacitracin-polymyxin (NEOSPORIN) [phone] ointment Apply 1 application topically 4 (four) times daily as needed (for cut/scrapes.).    Marland Kitchen nicotine (NICODERM CQ - DOSED IN MG/24 HOURS) 21 mg/24hr patch Place 1 patch (21 mg total) onto the skin daily. 28 patch 3  . pantoprazole (PROTONIX) 40 MG tablet Take 40 mg by mouth daily.    . phenazopyridine (AZO-TABS) 95 MG tablet Take 95 mg by mouth 3 (three) times daily as needed for pain.    . polyethylene glycol powder (GLYCOLAX/MIRALAX) powder MIX 17 GRAMS (1 CAPFUL) WITH 4-8 OZ OF LIQUID AND TAKE BY MOUTH TWICE DAILY AS NEEDED 527 g 0  . temazepam (RESTORIL) 30 MG capsule TAKE ONE TO TWO CAPSULES BY MOUTH NIGHTLY AT BEDTIME 60 capsule 4  . tiZANidine (ZANAFLEX) 4 MG tablet TAKE 2 TABLETS (8 MG TOTAL) BY MOUTH EVERY 6 (SIX) HOURS AS NEEDED FOR MUSCLE SPASMS. 720 tablet 0  . traZODone (DESYREL) 50 MG tablet Take 2 tablets (100 mg total) by mouth at bedtime. 60 tablet 7  . triamcinolone cream (KENALOG) 0.1 % Apply 1 application topically 2 (two) times daily. 30 g 0   No current facility-administered medications for this visit.     Neurologic: Headache: No Seizure: No Paresthesias:No  Musculoskeletal: Strength & Muscle Tone: within normal limits Gait & Station: normal Patient leans: N/A  Psychiatric Specialty Exam: ROS  Last menstrual period 08/23/2014.There is no height or weight on file to calculate BMI.  General Appearance: Casual  Eye Contact:  Good  Speech:  Clear and Coherent  Volume:  Normal  Mood:  Negative  Affect:  Appropriate  Thought Process:  Goal Directed  Orientation:  NA  Thought Content:  Logical  Suicidal Thoughts:  No  Homicidal Thoughts:  No  Memory:  Negative  Judgement:  Good  Insight:  Good  Psychomotor Activity:  Normal  Concentration:    Recall:    Fund of Knowledge:Good  Language: Good  Akathisia:  No  Handed:  Right  AIMS (if indicated):    Assets:  Desire  for Improvement  ADL's:  Intact  Cognition: WNL  Sleep:      5/20/20202:04 PM    This patient's #1 problem is that of bipolar disorder mixed.  She takes Lamictal 300 mg and will continue.  Her second problem seems to be insomnia.  She will continue taking high-dose Restoril 60 mg and continue the trazodone as prescribed.  Patient also takes Cymbalta but I think that is related to her bipolar disorder.  In essence the patient is stable.  She is absolutely at her baseline.  In some ways she is tolerating the viral pandemic fairly well.  Her additional medical problem is multiple root canals.  Other than this the patient says medically she is unchanged.  She also will continue taking Hydroxacen for anxiety.  This patient will return to see me in 3 months.

## 2019-04-06 DIAGNOSIS — B379 Candidiasis, unspecified: Secondary | ICD-10-CM

## 2019-04-06 DIAGNOSIS — T3695XA Adverse effect of unspecified systemic antibiotic, initial encounter: Secondary | ICD-10-CM

## 2019-04-06 MED ORDER — FLUCONAZOLE 150 MG PO TABS: 150.0000 mg | ORAL_TABLET | Freq: Once | ORAL | 0 refills | Status: AC

## 2019-04-11 DIAGNOSIS — M1990 Unspecified osteoarthritis, unspecified site: Secondary | ICD-10-CM

## 2019-04-11 DIAGNOSIS — B001 Herpesviral vesicular dermatitis: Secondary | ICD-10-CM

## 2019-04-12 MED ORDER — HYDROCODONE-ACETAMINOPHEN 10-325 MG PO TABS: 1.0000 | ORAL_TABLET | Freq: Four times a day (QID) | ORAL | 0 refills | Status: DC | PRN

## 2019-04-12 MED ORDER — VALACYCLOVIR HCL 1 G PO TABS: 1000.0000 mg | ORAL_TABLET | Freq: Two times a day (BID) | ORAL | 0 refills | Status: AC

## 2019-04-17 ENCOUNTER — Ambulatory Visit (INDEPENDENT_AMBULATORY_CARE_PROVIDER_SITE_OTHER): Payer: PPO | Admitting: Primary Care

## 2019-04-17 ENCOUNTER — Encounter: Payer: Self-pay | Admitting: Primary Care

## 2019-04-17 DIAGNOSIS — E538 Deficiency of other specified B group vitamins: Secondary | ICD-10-CM

## 2019-04-17 DIAGNOSIS — K219 Gastro-esophageal reflux disease without esophagitis: Secondary | ICD-10-CM | POA: Diagnosis not present

## 2019-04-17 DIAGNOSIS — E785 Hyperlipidemia, unspecified: Secondary | ICD-10-CM | POA: Diagnosis not present

## 2019-04-17 DIAGNOSIS — G894 Chronic pain syndrome: Secondary | ICD-10-CM

## 2019-04-17 DIAGNOSIS — R079 Chest pain, unspecified: Secondary | ICD-10-CM

## 2019-04-17 DIAGNOSIS — Z720 Tobacco use: Secondary | ICD-10-CM

## 2019-04-17 DIAGNOSIS — R7303 Prediabetes: Secondary | ICD-10-CM | POA: Diagnosis not present

## 2019-04-17 NOTE — Progress Notes (Signed)
Subjective:    Patient ID: Emily Moon, female    DOB: 03-18-1961, 58 y.o.   MRN: [id number]  HPI  Virtual Visit via Video Note  I connected with Emily Moon on 04/17/19 at  8:20 AM EDT by a video enabled telemedicine application and verified that I am speaking with the correct person using two identifiers.  Location: Patient: Home Provider: Office   I discussed the limitations of evaluation and management by telemedicine and the availability of in person appointments. The patient expressed understanding and agreed to proceed.  History of Present Illness:  Emily Moon is a 58 year ol female with a history of multiple sclerosis, bipolar disorder, GERD, OSA, asthma, chronic pain, seasonal allergies who presents today with a chief complaint of   She's felt dizzy and "off" over the last several weeks. Last night she developed substernal chest pain with radiation to her right and upper chest while in the shower. She then developed left upper extremity numbness, and sore throat, nausea, constipation. The pain in her chest felt as though she swallowed a large bolus of food, but she didn't. She's been checking her vitals with pulse oxygen at 97-98%, HR ranging 90's-137's. Symptoms lasted for 50 minutes in duration. She did not go to the hospital.    She's had children's aspirin within the last 24 hours, she didn't sleep all night. Today she's feeling tired and dizzy but thinks this is due to not sleeping all night. She's experiencing esophageal burning, nausea, belching now. The numbness to her left upper extremity has resolved.   About one week ago she was getting up from bed, felt weak, and woke up on the floor. She hit her chin and right side of her upper extremities. She is working to cut back on her medications including Ambien and Tizanidine. She is taking Lamictal, hydroxyzine, Cymbalta, Trazodone, and Temazepam at bedtime now. She is also managed on Norco for which she has taken  for years.    Observations/Objective:  Alert and oriented. Appears well, not sickly. No distress. Speaking in complete sentences.   Assessment and Plan:  Symptoms could be secondary to cardiac, GERD, MS flare. She is having active GERD symptoms now, crushed aspirin could have aggravated. She has a long history of hyperlipidemia and has refused treatment numerous time in the past, continues to do so now. She refuses to go to the ED for ECG and labs now despite recommendations. Advised she come to our office for an ECG and labs, she declines today as she is tired and wants to sleep. Labs and ECG pending. Discussed possibility for cardiology evaluation given chest pain last night, also with syncopal episode last week. Her medications could be largely at cause for this.  Discussed the need to start working on reducing the amount of narcotics she's taking, she will start to reduce.  Strict ED precautions provided.  [name], NP    The 10-year ASCVD risk score Mikey Bussing DC Brooke Bonito., et al., 2013) is: 2.1%   Values used to calculate the score:     Age: 58 years     Sex: Female     Is Non-Hispanic African American: No     Diabetic: No     Tobacco smoker: No     Systolic Blood Pressure: 381 mmHg     Is BP treated: No     HDL Cholesterol: 63.6 mg/dL     Total Cholesterol: 318 mg/dL   Follow Up Instructions:  Please  go to the hospital if your symptoms return.  We will contact you for an ECG and labs.  Start to reduce your hydrocodone pain medication, try taking this every 8 hours rather than every 6.   Work with your psychiatrist to get off of some of your nightly medications.  It was a pleasure to see you today! Allie Bossier, NP-C    I discussed the assessment and treatment plan with the patient. The patient was provided an opportunity to ask questions and all were answered. The patient agreed with the plan and demonstrated an understanding of the instructions.   The patient  was advised to call back or seek an in-person evaluation if the symptoms worsen or if the condition fails to improve as anticipated.     [name], NP   Review of Systems  Constitutional: Negative for fatigue and fever.  Respiratory: Negative for shortness of breath.   Cardiovascular: Negative for chest pain.  Gastrointestinal: Negative for abdominal pain.       Esophageal burning, nausea   Neurological: Positive for dizziness. Negative for headaches.       Past Medical History:  Diagnosis Date   Arthritis    osteo   Asthma    Bipolar disorder ([place]) 05/21/14   Cataracts, bilateral    DDD (degenerative disc disease), cervical    also back   Depression    Dizziness    Positional   Edema    feet/legs   Fibromyalgia syndrome    Fungal infection    Finger nails   GERD (gastroesophageal reflux disease)    Gout    Headache    seasonal allergies   Heart palpitations    Hip dysplasia, 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   Hypercholesterolemia    Multiple sclerosis (HCC)    weakness   Nephrolithiasis    kidney stones   Osteoporosis    osteoarthritis   Pneumonia    PONV (postoperative nausea and vomiting)    no problem after cataract surgery   Psoriasis    Renal stone    Shortness of breath dyspnea    wheezing   Sleep apnea 2012   sleep study / slight, no interventions   Urinary frequency      Social History   Socioeconomic History   Marital status: Divorced    Spouse name: Not on file   Number of children: 1   Years of education: Not on file   Highest education level: Not on file  Occupational History   Occupation: Therapist, 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[employer]: [employer] resource strain: Not on file   Food insecurity:    Worry: Not on file    Inability: Not on file   Transportation needs:    Medical: Not on file    Non-medical: Not on file  Tobacco Use   Smoking status: Former Smoker    Years:  25.00    Types: Cigarettes    Last attempt to quit: 11/16/2012    Years since quitting: 6.4   Smokeless tobacco: Never Used   Tobacco comment: occasional use  Substance and Sexual Activity   Alcohol use: No    Alcohol/week: 0.0 standard drinks   Drug use: No    Types: Methylphenidate   Sexual activity: Never  Lifestyle   Physical activity:    Days per week: Not on file    Minutes per session: Not on file   Stress: Not on file  Relationships  Social connections:    Talks on phone: Not on file    Gets together: Not on file    Attends religious service: Not on file    Active member of club or organization: Not on file    Attends meetings of clubs or organizations: Not on file    Relationship status: Not on file   Intimate partner violence:    Fear of current or ex partner: Not on file    Emotionally abused: Not on file    Physically abused: Not on file    Forced sexual activity: Not on file  Other Topics Concern   Not on file  Social History Narrative   Not on file    Past Surgical History:  Procedure Laterality Date   CATARACT EXTRACTION W/PHACO Left 05/21/2015   Procedure: CATARACT EXTRACTION PHACO AND INTRAOCULAR LENS PLACEMENT ([place]);  Surgeon: Birder Robson, MD;  Location: [facility];  Service: Ophthalmology;  Laterality: Left;  [place] 00:35 AP% 22.9 CDE 8.11 fluid pack lot #[id number]   CATARACT EXTRACTION W/PHACO Right 06/04/2015   Procedure: CATARACT EXTRACTION PHACO AND INTRAOCULAR LENS PLACEMENT ([facility]);  Surgeon: Birder Robson, MD;  Location: [facility];  Service: Ophthalmology;  Laterality: Right;  US:00:48 AP%: 10.5 CDE:5.08 Fluid lot #[id number]   CYSTOSCOPY/URETEROSCOPY/HOLMIUM LASER/STENT PLACEMENT Bilateral 09/22/2016   Procedure: CYSTOSCOPY/URETEROSCOPY/HOLMIUM LASER/STENT PLACEMENT;  Surgeon: Hollice Espy, MD;  Location: [facility];  Service: Urology;  Laterality: Bilateral;   EYE SURGERY  2015   tissue biopsy   FOOT SURGERY  2015   JOINT  REPLACEMENT Left 2013   hip replacement   LITHOTRIPSY     PTOSIS REPAIR Bilateral 02/18/2016   Procedure: BILATERAL PTOSIS REPAIR UPPER EYELIDS;  Surgeon: Karle Starch, MD;  Location: [place];  Service: Ophthalmology;  Laterality: Bilateral;  LEAVE PT EARLY AM   thumb surgery Right    TONSILLECTOMY  1973    Family History  Problem Relation Age of Onset   Cancer Father        Abdomen with mastasis   Cancer Mother    Heart disease Mother    Kidney disease Neg Hx    Bladder Cancer Neg Hx    Prostate cancer Neg Hx    Kidney cancer Neg Hx     Allergies  Allergen Reactions   Albuterol Shortness Of Breath and Other (See Comments)    Makes pt feel jittery/ tacycardic   Crestor [Rosuvastatin] Other (See Comments)    Joint pain, muscle pain, and hair loss   Halcion [Triazolam] Other (See Comments)    Dizziness,headaches,bladder problems   Levaquin [Levofloxacin In D5w] Diarrhea and Itching    Shoulder pain   Naproxen Sodium Swelling    Patient tolerates in small doses   Tylenol [Acetaminophen] Swelling    Patient tolerates in small doses   Cefaclor Other (See Comments)    Doesn't remember---unsure if actually allergic    Diclofenac Sodium Other (See Comments)    "made very sick"   Sulfa Antibiotics Itching    Unsure of reaction possibly itching   Tramadol Itching and Nausea And Vomiting   Aripiprazole Other (See Comments)    Muscle tension/cramping   Ibuprofen Swelling    Patient tolerates in small doses    Current Outpatient Medications on File Prior to Visit  Medication Sig Dispense Refill   ASPERCREME LIDOCAINE EX Apply 1 application topically 4 (four) times daily as needed (for pain.).      aspirin 325 MG tablet Take 325 mg by mouth every  4 (four) hours as needed for headache.      azelastine (ASTELIN) 0.1 % nasal spray PLACE 2 SPRAYS INTO BOTH NOSTRILS 2 (TWO) TIMES DAILY. 30 mL 0   BIOTIN PO Take 200 mg by mouth daily.       calcipotriene-betamethasone (TACLONEX) ointment Apply 1 application topically daily as needed (for psorasis).      Cholecalciferol (VITAMIN D3) 10000 units capsule Take 30,000 Units by mouth daily.      clobetasol cream (TEMOVATE) 7.16 % Apply 1 application topically 2 (two) times daily. 30 g 2   cyanocobalamin (,VITAMIN B-12,) 1000 MCG/ML injection Inject 1 mL (1,000 mcg total) into the muscle every 3 (three) months. 1 mL 0   diclofenac sodium (VOLTAREN) 1 % GEL Apply 4 g topically 4 (four) times daily. 500 g 5   docusate sodium (COLACE) 100 MG capsule Take 1 capsule (100 mg total) by mouth 2 (two) times daily. 60 capsule 0   DULoxetine (CYMBALTA) 20 MG capsule Take 2 capsules (40 mg total) by mouth at bedtime. 180 capsule 3   HYDROcodone-acetaminophen (NORCO) 10-325 MG tablet Take 1 tablet by mouth every 6 (six) hours as needed for moderate pain or severe pain. Supervising physician; Dr Eliezer Lofts; DEA# [id number] 120 tablet 0   hydroquinone 4 % cream 1 APPLICATION TOPICALLY DAILY AS NEEDED FOR BLEMISHES.  3   hydrOXYzine (VISTARIL) 50 MG capsule TAKE 1 CAPSLE BY MOUTH AT NOON, 1 AT 6 PM, AND 1 AS NEEDED 270 capsule 2   ketoconazole (NIZORAL) 2 % cream Apply 1 application topically daily. 30 g 0   ketorolac (TORADOL) 10 MG tablet Take 10 mg by mouth every 6 (six) hours as needed.     lamoTRIgine (LAMICTAL) 150 MG tablet Take 2 tablets (300 mg total) by mouth at bedtime. 60 tablet 5   levalbuterol (XOPENEX HFA) 45 MCG/ACT inhaler Inhale 1-2 puffs into the lungs every 6 (six) hours as needed for wheezing. 1 Inhaler 0   levalbuterol (XOPENEX) 1.25 MG/3ML nebulizer solution USE 1 VIAL VIA NEBULIZER EVERY 4 HOURS AS NEEDED FOR WHEEZING OR SHORTNESS OF BREATH 270 mL 1   Lysine 1000 MG TABS Take 2,000-4,000 mg by mouth at bedtime. 2000 mg scheduled at bedtime and patient will take 4000 mg if she has outbreak     Magnesium 500 MG TABS Take 1 tablet by mouth at bedtime.      neomycin-bacitracin-polymyxin (NEOSPORIN) [phone] ointment Apply 1 application topically 4 (four) times daily as needed (for cut/scrapes.).     nicotine (NICODERM CQ - DOSED IN MG/24 HOURS) 21 mg/24hr patch Place 1 patch (21 mg total) onto the skin daily. 28 patch 3   pantoprazole (PROTONIX) 40 MG tablet Take 40 mg by mouth daily.     phenazopyridine (AZO-TABS) 95 MG tablet Take 95 mg by mouth 3 (three) times daily as needed for pain.     polyethylene glycol powder (GLYCOLAX/MIRALAX) powder MIX 17 GRAMS (1 CAPFUL) WITH 4-8 OZ OF LIQUID AND TAKE BY MOUTH TWICE DAILY AS NEEDED 527 g 0   temazepam (RESTORIL) 30 MG capsule TAKE ONE TO TWO CAPSULES BY MOUTH NIGHTLY AT BEDTIME 60 capsule 4   tiZANidine (ZANAFLEX) 4 MG tablet TAKE 2 TABLETS (8 MG TOTAL) BY MOUTH EVERY 6 (SIX) HOURS AS NEEDED FOR MUSCLE SPASMS. 720 tablet 0   traZODone (DESYREL) 50 MG tablet Take 2 tablets (100 mg total) by mouth at bedtime. 60 tablet 7   triamcinolone cream (KENALOG) 0.1 % Apply 1 application topically  2 (two) times daily. 30 g 0   valACYclovir (VALTREX) 1000 MG tablet Take 1 tablet (1,000 mg total) by mouth 2 (two) times daily for 7 days. 14 tablet 0   No current facility-administered medications on file prior to visit.     LMP 08/23/2014    Objective:   Physical Exam  Constitutional: She is oriented to person, place, and time. She appears well-nourished. She does not have a sickly appearance. She does not appear ill.  Respiratory: Effort normal. No respiratory distress.  Neurological: She is alert and oriented to person, place, and time.  Skin: Skin is dry.  Psychiatric: She has a normal mood and affect.           Assessment & Plan:

## 2019-04-17 NOTE — Assessment & Plan Note (Signed)
Symptoms could be secondary to cardiac, GERD, MS flare. She is having active GERD symptoms now, crushed aspirin could have aggravated. She has a long history of hyperlipidemia and has refused treatment numerous time in the past, continues to do so now. She refuses to go to the ED for ECG and labs now despite recommendations. Advised she come to our office for an ECG and labs, she declines today as she is tired and wants to sleep.  Discussed possibility for cardiology evaluation given chest pain last night, also with syncopal episode last week. Her medications could be largely at cause for this.  Discussed the need to start working on reducing the amount of narcotics she's taking, she will start to reduce.  Strict ED precautions provided.   The 10-year ASCVD risk score Mikey Bussing DC Brooke Bonito., et al., 2013) is: 2.1%   Values used to calculate the score:     Age: 58 years     Sex: Female     Is Non-Hispanic African American: No     Diabetic: No     Tobacco smoker: No     Systolic Blood Pressure: 703 mmHg     Is BP treated: No     HDL Cholesterol: 63.6 mg/dL     Total Cholesterol: 318 mg/dL

## 2019-04-17 NOTE — Assessment & Plan Note (Signed)
Repeat lipids pending. Continues to decline treatment. Explained her risk for CVD given symptoms, smoking history, prediabetes, and uncontrolled lipids.

## 2019-04-17 NOTE — Assessment & Plan Note (Signed)
Recent symptoms similar, especially this morning. Discussed to resume pantoprazole daily.

## 2019-04-17 NOTE — Telephone Encounter (Signed)
No response was given to patient via my chart as she was contacted immediately for a virtual visit. Labs and testing pending.

## 2019-04-17 NOTE — Assessment & Plan Note (Signed)
Discussed goals of eventually coming off of Norco, will start with reducing to every 8 hours for administration.

## 2019-04-17 NOTE — Patient Instructions (Signed)
Please go to the hospital if your symptoms return.  We will contact you for an ECG and labs.  Start to reduce your hydrocodone pain medication, try taking this every 8 hours rather than every 6.   Work with your psychiatrist to get off of some of your nightly medications.  It was a pleasure to see you today! Allie Bossier, NP-C

## 2019-04-17 NOTE — Assessment & Plan Note (Signed)
Repeat A1C pending. 

## 2019-04-17 NOTE — Assessment & Plan Note (Signed)
Quit smoking several months ago, encouraged her to continue to resist.

## 2019-04-17 NOTE — Assessment & Plan Note (Signed)
Repeat B12 pending, symptoms of numbness could be from low B12.

## 2019-04-19 ENCOUNTER — Other Ambulatory Visit: Payer: Self-pay | Admitting: Primary Care

## 2019-04-19 DIAGNOSIS — J3089 Other allergic rhinitis: Secondary | ICD-10-CM

## 2019-05-09 DIAGNOSIS — G35 Multiple sclerosis: Secondary | ICD-10-CM | POA: Diagnosis not present

## 2019-05-10 ENCOUNTER — Other Ambulatory Visit (INDEPENDENT_AMBULATORY_CARE_PROVIDER_SITE_OTHER): Payer: PPO

## 2019-05-10 DIAGNOSIS — E785 Hyperlipidemia, unspecified: Secondary | ICD-10-CM

## 2019-05-10 DIAGNOSIS — R079 Chest pain, unspecified: Secondary | ICD-10-CM

## 2019-05-10 DIAGNOSIS — R350 Frequency of micturition: Secondary | ICD-10-CM

## 2019-05-10 DIAGNOSIS — G894 Chronic pain syndrome: Secondary | ICD-10-CM

## 2019-05-10 DIAGNOSIS — E538 Deficiency of other specified B group vitamins: Secondary | ICD-10-CM

## 2019-05-10 DIAGNOSIS — R7303 Prediabetes: Secondary | ICD-10-CM | POA: Diagnosis not present

## 2019-05-10 LAB — POC URINALSYSI DIPSTICK (AUTOMATED)
Bilirubin, UA: NEGATIVE
Blood, UA: NEGATIVE
Glucose, UA: NEGATIVE
Ketones, UA: NEGATIVE
Nitrite, UA: NEGATIVE
Protein, UA: NEGATIVE
Spec Grav, UA: 1.025 (ref 1.010–1.025)
Urobilinogen, UA: 0.2 E.U./dL
pH, UA: 6.5 (ref 5.0–8.0)

## 2019-05-11 LAB — BASIC METABOLIC PANEL
BUN: 20 mg/dL (ref 6–23)
CO2: 24 mEq/L (ref 19–32)
Calcium: 9.2 mg/dL (ref 8.4–10.5)
Chloride: 102 mEq/L (ref 96–112)
Creatinine, Ser: 0.73 mg/dL (ref 0.40–1.20)
GFR: 82.15 mL/min (ref >60.00)
Glucose, Bld: 119 mg/dL — ABNORMAL HIGH (ref 70–99)
Potassium: 4.6 mEq/L (ref 3.5–5.1)
Sodium: 138 mEq/L (ref 135–145)

## 2019-05-11 LAB — LIPID PANEL
Cholesterol: 353 mg/dL — ABNORMAL HIGH (ref 0–200)
HDL: 61.2 mg/dL (ref >39.00)
NonHDL: 291.38
Total CHOL/HDL Ratio: 6
Triglycerides: 210 mg/dL — ABNORMAL HIGH (ref 0.0–149.0)
VLDL: 42 mg/dL — ABNORMAL HIGH (ref 0.0–40.0)

## 2019-05-11 LAB — LDL CHOLESTEROL, DIRECT: Direct LDL: 264 mg/dL

## 2019-05-11 LAB — VITAMIN B12: Vitamin B-12: 225 pg/mL (ref 211–911)

## 2019-05-11 LAB — URINE CULTURE
MICRO NUMBER:: 603141
SPECIMEN QUALITY:: ADEQUATE

## 2019-05-11 LAB — TSH: TSH: 1.31 u[IU]/mL (ref 0.35–4.50)

## 2019-05-11 LAB — HEMOGLOBIN A1C: Hgb A1c MFr Bld: 5.8 % (ref 4.6–6.5)

## 2019-05-12 ENCOUNTER — Telehealth (INDEPENDENT_AMBULATORY_CARE_PROVIDER_SITE_OTHER): Payer: PPO | Admitting: Primary Care

## 2019-05-12 DIAGNOSIS — E538 Deficiency of other specified B group vitamins: Secondary | ICD-10-CM

## 2019-05-12 DIAGNOSIS — E785 Hyperlipidemia, unspecified: Secondary | ICD-10-CM

## 2019-05-12 DIAGNOSIS — K219 Gastro-esophageal reflux disease without esophagitis: Secondary | ICD-10-CM | POA: Diagnosis not present

## 2019-05-12 DIAGNOSIS — G894 Chronic pain syndrome: Secondary | ICD-10-CM | POA: Diagnosis not present

## 2019-05-12 DIAGNOSIS — R1310 Dysphagia, unspecified: Secondary | ICD-10-CM

## 2019-05-12 DIAGNOSIS — M1990 Unspecified osteoarthritis, unspecified site: Secondary | ICD-10-CM | POA: Diagnosis not present

## 2019-05-12 DIAGNOSIS — R7303 Prediabetes: Secondary | ICD-10-CM | POA: Diagnosis not present

## 2019-05-12 DIAGNOSIS — G35 Multiple sclerosis: Secondary | ICD-10-CM | POA: Diagnosis not present

## 2019-05-12 LAB — PAIN MGMT, PROFILE 8 W/CONF, U
6 Acetylmorphine: NEGATIVE ng/mL
Alcohol Metabolites: NEGATIVE ng/mL (ref <500)
Alphahydroxyalprazolam: NEGATIVE ng/mL
Alphahydroxymidazolam: NEGATIVE ng/mL
Alphahydroxytriazolam: NEGATIVE ng/mL
Aminoclonazepam: NEGATIVE ng/mL
Amphetamines: NEGATIVE ng/mL
Benzodiazepines: POSITIVE ng/mL
Buprenorphine, Urine: NEGATIVE ng/mL
Cocaine Metabolite: NEGATIVE ng/mL
Creatinine: 52.1 mg/dL
Hydroxyethylflurazepam: NEGATIVE ng/mL
Lorazepam: NEGATIVE ng/mL
MDMA: NEGATIVE ng/mL
Marijuana Metabolite: NEGATIVE ng/mL
Nordiazepam: NEGATIVE ng/mL
Opiates: NEGATIVE ng/mL
Oxazepam: 1017 ng/mL
Oxidant: NEGATIVE ug/mL
Oxycodone: NEGATIVE ng/mL
Temazepam: >8000 ng/mL
pH: 6.5 (ref 4.5–9.0)

## 2019-05-12 MED ORDER — PANTOPRAZOLE SODIUM 40 MG PO TBEC: 40.0000 mg | DELAYED_RELEASE_TABLET | Freq: Every day | ORAL | 1 refills | Status: DC

## 2019-05-12 MED ORDER — HYDROCODONE-ACETAMINOPHEN 10-325 MG PO TABS: 1.0000 | ORAL_TABLET | Freq: Three times a day (TID) | ORAL | 0 refills | Status: DC | PRN

## 2019-05-12 MED ORDER — ATORVASTATIN CALCIUM 20 MG PO TABS: 20.0000 mg | ORAL_TABLET | Freq: Every day | ORAL | 0 refills | Status: DC

## 2019-05-12 NOTE — Assessment & Plan Note (Signed)
Sounds to be more active recently. Following with neurology who recommends prednisone for which she doesn't think she will take. Continue regular follow up.

## 2019-05-12 NOTE — Assessment & Plan Note (Signed)
Improved, no longer doing IM injections or oral meds. Continue to monitor.

## 2019-05-12 NOTE — Assessment & Plan Note (Signed)
Compliant to Norco as prescribed, she is open to reducing her frequency of Norco. New Rx for Norco 10-325 mg every 8 hours provided.  No suspicious activity noted on PMP aware site.  Agree to continue prescription as she seems to be benefiting. Follow up in 3 months.

## 2019-05-12 NOTE — Patient Instructions (Signed)
We've reduced the frequency of your hydrocodone-acetaminophen 10-325 mg to every 8 hours. I sent a new prescription to your pharmacy.  You will be contacted regarding your referral to GI for the swallowing study.  Please let us know if you have not been contacted within one week.   Follow up with your neurologist as recommended.  Start atorvastatin 20 mg tablets for cholesterol. Take this once nightly for cholesterol.  Please schedule a follow up appointment in 3 months for pain management evaluation.   It was a pleasure to see you today!

## 2019-05-12 NOTE — Assessment & Plan Note (Signed)
Recent A1C improved from 6.0 to 5.8. Continue to monitor. Encouraged regular exercise and a healthy diet.

## 2019-05-12 NOTE — Assessment & Plan Note (Signed)
Above goal on recent labs. She does agree to try low dose statin therapy. Rx for atorvastatin sent to pharmacy. She will update.

## 2019-05-12 NOTE — Progress Notes (Signed)
Subjective:    Patient ID: Emily Moon, female    DOB: Jun 04, 1961, 58 y.o.   MRN: [id number]  HPI  Virtual Visit via Video Note  I connected with Emily Moon on 05/12/19 at 11:40 AM EDT by a video enabled telemedicine application and verified that I am speaking with the correct person using two identifiers.  Location: Patient: Office Provider: Home   I discussed the limitations of evaluation and management by telemedicine and the availability of in person appointments. The patient expressed understanding and agreed to proceed.  We initially connected via video but the video failed. We conducted most of our visit via phone which lasted 24 min and 28 sec.  History of Present Illness:  Ms. Emily Moon is a 58 year old female who presents today for follow up and for chronic opioid use.  1) Chronic Pain/Osteoarthritis: Currently managed on hydrocodone-acetaminophen 10-325 mg QID for which she takes scheduled. She's been on this regimen for years and feels well managed. She completed her UDS recently. She has not tried reducing the dose of her Norco but is open to do this.  2) Hyperlipidemia: Was unable tolerate statin therapy (Crestor and Pravastatin) or Zetia in the past. Has refused to re-try given chronic pain and history of MS. Recent lipid panel with TC of 353, LDL of 262, Trigs of 210. Today she is open to re-trying a low dose medication.   3) Multiple Sclerosis: Currently following with neurology through [facility]. She recently connected with her neurologist on 05/09/19 who doesn't believe that her syncope is related to the MS. She was also told to see GI for difficulty swallowing. She has a family history of dysphagia and both have had esophageal stretching. She is compliant to her pantoprazole 40 mg.   She continues to have moderate numbness to the mid upper extremities down to her hands, also with bilateral lower extremity heaviness. Her neurologist wants to initiate  prednisone to her daily regimen, she has not yet started this yet as she doesn't want to take.     Observations/Objective:  Alert and oriented. Appears well, not sickly. No distress. Speaking in complete sentences.   Assessment and Plan:  See problem based charting.  Follow Up Instructions:  We've reduced the frequency of your hydrocodone-acetaminophen 10-325 mg to every 8 hours. I sent a new prescription to your pharmacy.  You will be contacted regarding your referral to GI for the swallowing study.  Please let us know if you have not been contacted within one week.   Follow up with your neurologist as recommended.  Start atorvastatin 20 mg tablets for cholesterol. Take this once nightly for cholesterol.  Please schedule a follow up appointment in 3 months for pain management evaluation.   It was a pleasure to see you today!    I discussed the assessment and treatment plan with the patient. The patient was provided an opportunity to ask questions and all were answered. The patient agreed with the plan and demonstrated an understanding of the instructions.   The patient was advised to call back or seek an in-person evaluation if the symptoms worsen or if the condition fails to improve as anticipated.     [name], NP    Review of Systems  HENT: Positive for trouble swallowing.   Respiratory: Negative for shortness of breath.   Cardiovascular: Negative for chest pain.  Musculoskeletal:       Chronic pain to all joints, mostly to hips  Neurological: Positive for weakness and numbness.       Past Medical History:  Diagnosis Date  . Arthritis    osteo  . Asthma   . Bipolar disorder ([place]) 05/21/14  . Cataracts, bilateral   . DDD (degenerative disc disease), cervical    also back  . Depression   . Dizziness    Positional  . Edema    feet/legs  . Fibromyalgia syndrome   . Fungal infection    Finger nails  . GERD (gastroesophageal reflux disease)    . Gout   . Headache    seasonal allergies  . Heart palpitations   . 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 Hypercholesterolemia   . Multiple sclerosis (HCC)    weakness  . Nephrolithiasis    kidney stones  . Osteoporosis    osteoarthritis  . Pneumonia   . PONV (postoperative nausea and vomiting)    no problem after cataract surgery  . Psoriasis   . Renal stone   . Shortness of breath dyspnea    wheezing  . Sleep apnea 2012   sleep study / slight, no interventions  . Urinary frequency      Social History   Socioeconomic History  . Marital status: Divorced    Spouse name: Not on file  . Number of children: 1  . Years of education: Not on file  . Highest education level: Not on file  Occupational History  . Occupation: Therapist, art Rep at [employer]  . Financial resource strain: Not on file  . Food insecurity    Worry: Not on file    Inability: Not on file  . Transportation needs    Medical: Not on file    Non-medical: Not on file  Tobacco Use  . Smoking status: Former Smoker    Years: 25.00    Types: Cigarettes    Quit date: 11/16/2012    Years since quitting: 6.4  . Smokeless tobacco: Never Used  . Tobacco comment: occasional use  Substance and Sexual Activity  . Alcohol use: No    Alcohol/week: 0.0 standard drinks  . Drug use: No    Types: Methylphenidate  . Sexual activity: Never  Lifestyle  . Physical activity    Days per week: Not on file    Minutes per session: Not on file  . Stress: Not on file  Relationships  . Social Herbalist on phone: Not on file    Gets together: Not on file    Attends religious service: Not on file    Active member of club or organization: Not on file    Attends meetings of clubs or organizations: Not on file    Relationship status: Not on file  . Intimate partner violence    Fear of current or ex partner: Not on file    Emotionally abused: Not on file    Physically  abused: Not on file    Forced sexual activity: Not on file  Other Topics Concern  . Not on file  Social History Narrative  . Not on file    Past Surgical History:  Procedure Laterality Date  . CATARACT EXTRACTION W/PHACO Left 05/21/2015   Procedure: CATARACT EXTRACTION PHACO AND INTRAOCULAR LENS PLACEMENT (IOC);  Surgeon: Birder Robson, MD;  Location: [facility]S;  Service: Ophthalmology;  Laterality: Left;  [place] 00:35 AP% 22.9 CDE 8.11 fluid pack lot #[id number]  . CATARACT EXTRACTION W/PHACO Right 06/04/2015  Procedure: CATARACT EXTRACTION PHACO AND INTRAOCULAR LENS PLACEMENT (IOC);  Surgeon: Birder Robson, MD;  Location: [facility];  Service: Ophthalmology;  Laterality: Right;  US:00:48 AP%: 10.5 CDE:5.08 Fluid lot #[id number]  . CYSTOSCOPY/URETEROSCOPY/HOLMIUM LASER/STENT PLACEMENT Bilateral 09/22/2016   Procedure: CYSTOSCOPY/URETEROSCOPY/HOLMIUM LASER/STENT PLACEMENT;  Surgeon: Hollice Espy, MD;  Location: [facility];  Service: Urology;  Laterality: Bilateral;  . EYE SURGERY  2015   tissue biopsy  . FOOT SURGERY  2015  . JOINT REPLACEMENT Left 2013   hip replacement  . LITHOTRIPSY    . PTOSIS REPAIR Bilateral 02/18/2016   Procedure: BILATERAL PTOSIS REPAIR UPPER EYELIDS;  Surgeon: Karle Starch, MD;  Location: [place];  Service: Ophthalmology;  Laterality: Bilateral;  LEAVE PT EARLY AM  . thumb surgery Right   . TONSILLECTOMY  1973    Family History  Problem Relation Age of Onset  . Cancer Father        Abdomen with mastasis  . Cancer Mother   . Heart disease Mother   . Kidney disease Neg Hx   . Bladder Cancer Neg Hx   . Prostate cancer Neg Hx   . Kidney cancer Neg Hx     Allergies  Allergen Reactions  . Albuterol Shortness Of Breath and Other (See Comments)    Makes pt feel jittery/ tacycardic  . Crestor [Rosuvastatin] Other (See Comments)    Joint pain, muscle pain, and hair loss  . Halcion [Triazolam] Other (See Comments)    Dizziness,headaches,bladder  problems  . Levaquin [Levofloxacin In D5w] Diarrhea and Itching    Shoulder pain  . Naproxen Sodium Swelling    Patient tolerates in small doses  . Tylenol [Acetaminophen] Swelling    Patient tolerates in small doses  . Cefaclor Other (See Comments)    Doesn't remember---unsure if actually allergic   . Diclofenac Sodium Other (See Comments)    "made very sick"  . Sulfa Antibiotics Itching    Unsure of reaction possibly itching  . Tramadol Itching and Nausea And Vomiting  . Aripiprazole Other (See Comments)    Muscle tension/cramping  . Ibuprofen Swelling    Patient tolerates in small doses    Current Outpatient Medications on File Prior to Visit  Medication Sig Dispense Refill  . ASPERCREME LIDOCAINE EX Apply 1 application topically 4 (four) times daily as needed (for pain.).     Marland Kitchen aspirin 325 MG tablet Take 325 mg by mouth every 4 (four) hours as needed for headache.     Marland Kitchen azelastine (ASTELIN) 0.1 % nasal spray PLACE 2 SPRAYS INTO BOTH NOSTRILS 2 (TWO) TIMES DAILY 30 mL 0  . BIOTIN PO Take 200 mg by mouth daily.     . calcipotriene-betamethasone (TACLONEX) ointment Apply 1 application topically daily as needed (for psorasis).     . Cholecalciferol (VITAMIN D3) 10000 units capsule Take 30,000 Units by mouth daily.     . clobetasol cream (TEMOVATE) 2.44 % Apply 1 application topically 2 (two) times daily. 30 g 2  . cyanocobalamin (,VITAMIN B-12,) 1000 MCG/ML injection Inject 1 mL (1,000 mcg total) into the muscle every 3 (three) months. 1 mL 0  . diclofenac sodium (VOLTAREN) 1 % GEL Apply 4 g topically 4 (four) times daily. 500 g 5  . docusate sodium (COLACE) 100 MG capsule Take 1 capsule (100 mg total) by mouth 2 (two) times daily. 60 capsule 0  . DULoxetine (CYMBALTA) 20 MG capsule Take 2 capsules (40 mg total) by mouth at bedtime. 180 capsule 3  .  hydroquinone 4 % cream 1 APPLICATION TOPICALLY DAILY AS NEEDED FOR BLEMISHES.  3  . hydrOXYzine (VISTARIL) 50 MG capsule TAKE 1 CAPSLE  BY MOUTH AT NOON, 1 AT 6 PM, AND 1 AS NEEDED 270 capsule 2  . ketoconazole (NIZORAL) 2 % cream Apply 1 application topically daily. 30 g 0  . ketorolac (TORADOL) 10 MG tablet Take 10 mg by mouth every 6 (six) hours as needed.    . lamoTRIgine (LAMICTAL) 150 MG tablet Take 2 tablets (300 mg total) by mouth at bedtime. 60 tablet 5  . levalbuterol (XOPENEX HFA) 45 MCG/ACT inhaler Inhale 1-2 puffs into the lungs every 6 (six) hours as needed for wheezing. 1 Inhaler 0  . levalbuterol (XOPENEX) 1.25 MG/3ML nebulizer solution USE 1 VIAL VIA NEBULIZER EVERY 4 HOURS AS NEEDED FOR WHEEZING OR SHORTNESS OF BREATH 270 mL 1  . Lysine 1000 MG TABS Take 2,000-4,000 mg by mouth at bedtime. 2000 mg scheduled at bedtime and patient will take 4000 mg if she has outbreak    . Magnesium 500 MG TABS Take 1 tablet by mouth at bedtime.    Marland Kitchen neomycin-bacitracin-polymyxin (NEOSPORIN) [phone] ointment Apply 1 application topically 4 (four) times daily as needed (for cut/scrapes.).    Marland Kitchen nicotine (NICODERM CQ - DOSED IN MG/24 HOURS) 21 mg/24hr patch Place 1 patch (21 mg total) onto the skin daily. 28 patch 3  . pantoprazole (PROTONIX) 40 MG tablet Take 40 mg by mouth daily.    . phenazopyridine (AZO-TABS) 95 MG tablet Take 95 mg by mouth 3 (three) times daily as needed for pain.    . polyethylene glycol powder (GLYCOLAX/MIRALAX) powder MIX 17 GRAMS (1 CAPFUL) WITH 4-8 OZ OF LIQUID AND TAKE BY MOUTH TWICE DAILY AS NEEDED 527 g 0  . temazepam (RESTORIL) 30 MG capsule TAKE ONE TO TWO CAPSULES BY MOUTH NIGHTLY AT BEDTIME 60 capsule 4  . tiZANidine (ZANAFLEX) 4 MG tablet TAKE 2 TABLETS (8 MG TOTAL) BY MOUTH EVERY 6 (SIX) HOURS AS NEEDED FOR MUSCLE SPASMS. 720 tablet 0  . traZODone (DESYREL) 50 MG tablet Take 2 tablets (100 mg total) by mouth at bedtime. 60 tablet 7  . triamcinolone cream (KENALOG) 0.1 % Apply 1 application topically 2 (two) times daily. 30 g 0   No current facility-administered medications on file prior to  visit.     LMP 08/23/2014    Objective:   Physical Exam  Constitutional: She is oriented to person, place, and time. She appears well-nourished.  Respiratory: Effort normal.  Neurological: She is alert and oriented to person, place, and time.  Psychiatric: She has a normal mood and affect.           Assessment & Plan:

## 2019-05-22 ENCOUNTER — Ambulatory Visit: Payer: PPO | Admitting: Primary Care

## 2019-05-22 DIAGNOSIS — M159 Polyosteoarthritis, unspecified: Secondary | ICD-10-CM

## 2019-05-22 DIAGNOSIS — G894 Chronic pain syndrome: Secondary | ICD-10-CM

## 2019-05-22 MED ORDER — KETOROLAC TROMETHAMINE 10 MG PO TABS: 10.0000 mg | ORAL_TABLET | Freq: Three times a day (TID) | ORAL | 0 refills | Status: DC | PRN

## 2019-05-25 ENCOUNTER — Encounter: Payer: Self-pay | Admitting: *Deleted

## 2019-06-06 ENCOUNTER — Encounter: Payer: Self-pay | Admitting: Family Medicine

## 2019-06-08 DIAGNOSIS — M1990 Unspecified osteoarthritis, unspecified site: Secondary | ICD-10-CM

## 2019-06-09 MED ORDER — HYDROCODONE-ACETAMINOPHEN 10-325 MG PO TABS: 1.0000 | ORAL_TABLET | Freq: Three times a day (TID) | ORAL | 0 refills | Status: DC | PRN

## 2019-06-09 NOTE — Telephone Encounter (Signed)
Pt called back checking on her refill  And regarding her knee  Please call pt  [phone]   Pt stated she requested this through my chart on 7/21

## 2019-06-11 NOTE — Progress Notes (Signed)
Antrell Tipler T. Apolinar Bero, MD Primary Care and Sports Medicine [facility] [place], [address] Phone: [phone]  FAX: [place] - 58 y.o. female  MRN [id number]  Date of Birth: 1961/01/28  Visit Date: 06/12/2019  PCP: [name], NP  Referred by: [name], NP  Chief Complaint  Patient presents with  . Leg Pain    Right-From Skydiving 06/01/2019  . Elbow Pain    Right  . Knee Pain    Left   Subjective:   Emily Moon is a 58 y.o. very pleasant female patient with Body mass index is 31.38 kg/m. who presents with the following:  She has MS and has had multiple falls in the past and she is coming in today with right knee, shin, and ankle pain.  Her primary pain is in the right knee and this hurts with pivoting and twisting.  Went up into a warthog and then did some skydiving. Fell while getting into the plain Using a compression brace.    R LE and foot, hurts to pivot on the R knee. Rotation on the R knee.   She has multiple abrasions around the right side as well as some significant bruising.  She also has some pain at the third phalanx on the right.  Entire R body, R knee. Is the worse.   R toe? R knee R hip - moves well  R elbow - calcification  Past Medical History, Surgical History, Social History, Family History, Problem List, Medications, and Allergies have been reviewed and updated if relevant.  Patient Active Problem List   Diagnosis Date Noted  . Bipolar disorder ([place]) 05/21/2014    Priority: High  . MULTIPLE SCLEROSIS, PROGRESSIVE/RELAPSING 08/28/2010    Priority: High  . Chronic pain syndrome 01/07/2011    Priority: Medium  . Rash and nonspecific skin eruption 02/20/2019  . Asthma 02/10/2019  . Epigastric pain 12/21/2018  . OSA (obstructive sleep apnea) 12/02/2018  . Joint swelling 10/24/2018  . Environmental and seasonal allergies 10/24/2018  .  Greater trochanteric bursitis of right hip 09/28/2018  . Acute pain of right shoulder 09/28/2018  . Prediabetes 07/22/2018  . Bipolar I disorder, most recent episode (or current) manic ([place]) 05/20/2018  . Vitamin D deficiency 02/15/2018  . Preventative health care 11/24/2017  . Urinary frequency 03/30/2017  . B12 deficiency 05/22/2016  . Dizziness 03/16/2016  . Medicare annual wellness visit, subsequent 10/25/2015  . Back pain 10/31/2014  . 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 Insomnia 09/01/2011  . Obesity 05/04/2011  . HLD (hyperlipidemia) 01/20/2011  . Tobacco abuse 01/20/2011  . Chest pain 01/20/2011  . RENAL CALCULUS, RECURRENT 11/13/2010  . GERD 08/28/2010  . Osteoarthritis 08/28/2010  . NEPHROLITHIASIS, HX OF 08/28/2010    Past Medical History:  Diagnosis Date  . Arthritis    osteo  . Asthma   . Bipolar disorder ([place]) 05/21/14  . Cataracts, bilateral   . DDD (degenerative disc disease), cervical    also back  . Depression   . Dizziness    Positional  . Edema    feet/legs  . Fibromyalgia syndrome   . Fungal infection    Finger nails  . GERD (gastroesophageal reflux disease)   . Gout   . Headache    seasonal allergies  . Heart palpitations   .  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 Hypercholesterolemia   . Multiple sclerosis (HCC)    weakness  . Nephrolithiasis    kidney stones  . Osteoporosis    osteoarthritis  . Pneumonia   . PONV (postoperative nausea and vomiting)    no problem after cataract surgery  . Psoriasis   . Renal stone   . Shortness of breath dyspnea    wheezing  . Sleep apnea 2012   sleep study / slight, no interventions  . Urinary frequency     Past Surgical History:  Procedure Laterality Date  . CATARACT EXTRACTION W/PHACO Left 05/21/2015   Procedure: CATARACT EXTRACTION PHACO AND INTRAOCULAR LENS PLACEMENT (IOC);  Surgeon:  Birder Robson, MD;  Location: [facility];  Service: Ophthalmology;  Laterality: Left;  [place] 00:35 AP% 22.9 CDE 8.11 fluid pack lot #[id number]  . CATARACT EXTRACTION W/PHACO Right 06/04/2015   Procedure: CATARACT EXTRACTION PHACO AND INTRAOCULAR LENS PLACEMENT (IOC);  Surgeon: Birder Robson, MD;  Location: [facility];  Service: Ophthalmology;  Laterality: Right;  US:00:48 AP%: 10.5 CDE:5.08 Fluid lot #[id number]  . CYSTOSCOPY/URETEROSCOPY/HOLMIUM LASER/STENT PLACEMENT Bilateral 09/22/2016   Procedure: CYSTOSCOPY/URETEROSCOPY/HOLMIUM LASER/STENT PLACEMENT;  Surgeon: Hollice Espy, MD;  Location: [facility] OR[facility];  Service: Urology;  Laterality: Bilateral;  . EYE SURGERY  2015   tissue biopsy  . FOOT SURGERY  2015  . JOINT REPLACEMENT Left 2013   hip replacement  . LITHOTRIPSY    . PTOSIS REPAIR Bilateral 02/18/2016   Procedure: BILATERAL PTOSIS REPAIR UPPER EYELIDS;  Surgeon: Karle Starch, MD;  Location: [facility];  Service: Ophthalmology;  Laterality: Bilateral;  LEAVE PT EARLY AM  . thumb surgery Right   . TONSILLECTOMY  1973    Social History   Socioeconomic History  . Marital status: Divorced    Spouse name: Not on file  . Number of children: 1  . Years of education: Not on file  . Highest education level: Not on file  Occupational History  . Occupation: Therapist, art Rep at [employer]  . Financial resource strain: Not on file  . Food insecurity    Worry: Not on file    Inability: Not on file  . Transportation needs    Medical: Not on file    Non-medical: Not on file  Tobacco Use  . Smoking status: Former Smoker    Years: 25.00    Types: Cigarettes    Quit date: 11/16/2012    Years since quitting: 6.5  . Smokeless tobacco: Never Used  . Tobacco comment: occasional use  Substance and Sexual Activity  . Alcohol use: No    Alcohol/week: 0.0 standard drinks  . Drug use: No    Types: Methylphenidate  . Sexual activity: Never   Lifestyle  . Physical activity    Days per week: Not on file    Minutes per session: Not on file  . Stress: Not on file  Relationships  . Social Herbalist on phone: Not on file    Gets together: Not on file    Attends religious service: Not on file    Active member of club or organization: Not on file    Attends meetings of clubs or organizations: Not on file    Relationship status: Not on file  . Intimate partner violence    Fear of current or ex partner: Not on file    Emotionally abused: Not on file    Physically abused: Not  on file    Forced sexual activity: Not on file  Other Topics Concern  . Not on file  Social History Narrative  . Not on file    Family History  Problem Relation Age of Onset  . Cancer Father        Abdomen with mastasis  . Cancer Mother   . Heart disease Mother   . Kidney disease Neg Hx   . Bladder Cancer Neg Hx   . Prostate cancer Neg Hx   . Kidney cancer Neg Hx     Allergies  Allergen Reactions  . Albuterol Shortness Of Breath and Other (See Comments)    Makes pt feel jittery/ tacycardic  . Crestor [Rosuvastatin] Other (See Comments)    Joint pain, muscle pain, and hair loss  . Halcion [Triazolam] Other (See Comments)    Dizziness,headaches,bladder problems  . Levaquin [Levofloxacin In D5w] Diarrhea and Itching    Shoulder pain  . Naproxen Sodium Swelling    Patient tolerates in small doses  . Tylenol [Acetaminophen] Swelling    Patient tolerates in small doses  . Cefaclor Other (See Comments)    Doesn't remember---unsure if actually allergic   . Diclofenac Sodium Other (See Comments)    "made very sick"  . Sulfa Antibiotics Itching    Unsure of reaction possibly itching  . Tramadol Itching and Nausea And Vomiting  . Aripiprazole Other (See Comments)    Muscle tension/cramping  . Ibuprofen Swelling    Patient tolerates in small doses    Medication list reviewed and updated in full in [place].  GEN: No  fevers, chills. Nontoxic. Primarily MSK c/o today. MSK: Detailed in the HPI GI: tolerating PO intake without difficulty Neuro: No numbness, parasthesias, or tingling associated. Otherwise the pertinent positives of the ROS are noted above.   Objective:   BP 90/60   Pulse 97   Temp (!) 97.2 F (36.2 C) (Temporal)   Ht 5\' 9"  (1.753 m)   Wt 212 lb 8 oz (96.4 kg)   LMP 08/23/2014   BMI 31.38 kg/m    GEN: WDWN, NAD, Non-toxic, Alert & Oriented x 3 HEENT: Atraumatic, Normocephalic.  Ears and Nose: No external deformity. EXTR: No clubbing/cyanosis/edema NEURO: Normal gait.  PSYCH: Normally interactive. Conversant. Not depressed or anxious appearing.  Calm demeanor.    At the patient's right knee, she does have full extension and flexion to 120 degrees.  There is a minimal effusion.  She does have significant joint line tenderness.  No significant tenderness with McMurray's, flexion pinch is negative.  ACL, PCL, MCL, and LCL are all intact.  Tibia and fibula are entirely nontender.  She does have some tenderness at the third and fourth phalanges.  There is no tenderness whatsoever at the first MTP as well as the entirety of the first phalanges  Radiology: Dg Knee 4 Views W/patella Right  Result Date: 06/12/2019 CLINICAL DATA:  Right knee pain, trauma EXAM: RIGHT KNEE - COMPLETE 4+ VIEW COMPARISON:  None. FINDINGS: No fracture or malalignment. Trace knee effusion. Mild patellofemoral degenerative change. IMPRESSION: No acute osseous abnormality.  Trace knee effusion Electronically Signed   By: Donavan Foil M.D.   On: 06/12/2019 21:53   Dg Foot Complete Right  Result Date: 06/12/2019 CLINICAL DATA:  Trauma, pain EXAM: RIGHT FOOT COMPLETE - 3+ VIEW COMPARISON:  05/09/2014, 03/08/2014, 03/29/2014 FINDINGS: Possible oblique lucency at the head of the first metatarsal on one view. Postsurgical deformity of the distal first metatarsal. Fixating  screw in the distal first and fifth metatarsals.  No subluxation. No radiopaque foreign body in the soft tissues. Small plantar calcaneal spur. IMPRESSION: Possible linear lucency/nondisplaced fracture at the head of the first metatarsal on one view. Electronically Signed   By: Donavan Foil M.D.   On: 06/12/2019 21:51     Assessment and Plan:     ICD-10-CM   1. Pain in right toe(s)  M79.674 DG Foot Complete Right  2. Acute pain of right knee  M25.561 DG Knee 4 Views W/Patella Right   >25 minutes spent in face to face time with patient, >50% spent in counselling or coordination of care   Multiple abrasions and contusions, likely bone contusion.  No evidence of fracture in the third or fourth phalanges.  Questionable lucency at the first metatarsal, but she has absolutely no pain in this region, and this finding was already been reviewed face-to-face with the patient.  Right knee is grossly unremarkable on plain film.  More likely bone contusion or meniscal contusion.  Follow-up in 4 to 5 weeks if not improved.  Follow-up: No follow-ups on file.  No orders of the defined types were placed in this encounter.  Orders Placed This Encounter  Procedures  . DG Foot Complete Right  . DG Knee 4 Views W/Patella Right    Signed,  Frederico Hamman T. Cataleah Stites, MD   Outpatient Encounter Medications as of 06/12/2019  Medication Sig  . ASPERCREME LIDOCAINE EX Apply 1 application topically 4 (four) times daily as needed (for pain.).   Marland Kitchen aspirin 325 MG tablet Take 325 mg by mouth every 4 (four) hours as needed for headache.   Marland Kitchen atorvastatin (LIPITOR) 20 MG tablet Take 1 tablet (20 mg total) by mouth daily. For cholesterol.  Marland Kitchen azelastine (ASTELIN) 0.1 % nasal spray PLACE 2 SPRAYS INTO BOTH NOSTRILS 2 (TWO) TIMES DAILY  . BIOTIN PO Take 200 mg by mouth daily.   . calcipotriene-betamethasone (TACLONEX) ointment Apply 1 application topically daily as needed (for psorasis).   . Cholecalciferol (VITAMIN D3) 10000 units capsule Take 30,000 Units by mouth  daily.   . clobetasol cream (TEMOVATE) 9.48 % Apply 1 application topically 2 (two) times daily.  . cyanocobalamin (,VITAMIN B-12,) 1000 MCG/ML injection Inject 1 mL (1,000 mcg total) into the muscle every 3 (three) months.  . diclofenac sodium (VOLTAREN) 1 % GEL Apply 4 g topically 4 (four) times daily.  Marland Kitchen docusate sodium (COLACE) 100 MG capsule Take 1 capsule (100 mg total) by mouth 2 (two) times daily.  . DULoxetine (CYMBALTA) 20 MG capsule Take 2 capsules (40 mg total) by mouth at bedtime.  Marland Kitchen HYDROcodone-acetaminophen (NORCO) 10-325 MG tablet Take 1 tablet by mouth every 8 (eight) hours as needed for moderate pain or severe pain. Supervising physician; Dr Eliezer Lofts; DEA# [id number]  . hydroquinone 4 % cream 1 APPLICATION TOPICALLY DAILY AS NEEDED FOR BLEMISHES.  . hydrOXYzine (VISTARIL) 50 MG capsule TAKE 1 CAPSLE BY MOUTH AT NOON, 1 AT 6 PM, AND 1 AS NEEDED  . ketorolac (TORADOL) 10 MG tablet Take 1 tablet (10 mg total) by mouth every 8 (eight) hours as needed for severe pain.  Marland Kitchen lamoTRIgine (LAMICTAL) 150 MG tablet Take 2 tablets (300 mg total) by mouth at bedtime.  . levalbuterol (XOPENEX HFA) 45 MCG/ACT inhaler Inhale 1-2 puffs into the lungs every 6 (six) hours as needed for wheezing.  . levalbuterol (XOPENEX) 1.25 MG/3ML nebulizer solution USE 1 VIAL VIA NEBULIZER EVERY 4 HOURS AS NEEDED FOR WHEEZING  OR SHORTNESS OF BREATH  . Lysine 1000 MG TABS Take 2,000-4,000 mg by mouth at bedtime. 2000 mg scheduled at bedtime and patient will take 4000 mg if she has outbreak  . Magnesium 500 MG TABS Take 1 tablet by mouth at bedtime.  Marland Kitchen neomycin-bacitracin-polymyxin (NEOSPORIN) [id number] ointment Apply 1 application topically 4 (four) times daily as needed (for cut/scrapes.).  Marland Kitchen pantoprazole (PROTONIX) 40 MG tablet Take 1 tablet (40 mg total) by mouth daily. For heartburn.  . phenazopyridine (AZO-TABS) 95 MG tablet Take 95 mg by mouth 3 (three) times daily as needed for pain.  . polyethylene glycol  powder (GLYCOLAX/MIRALAX) powder MIX 17 GRAMS (1 CAPFUL) WITH 4-8 OZ OF LIQUID AND TAKE BY MOUTH TWICE DAILY AS NEEDED  . temazepam (RESTORIL) 30 MG capsule TAKE ONE TO TWO CAPSULES BY MOUTH NIGHTLY AT BEDTIME  . tiZANidine (ZANAFLEX) 4 MG tablet TAKE 2 TABLETS (8 MG TOTAL) BY MOUTH EVERY 6 (SIX) HOURS AS NEEDED FOR MUSCLE SPASMS.  Marland Kitchen traZODone (DESYREL) 50 MG tablet Take 2 tablets (100 mg total) by mouth at bedtime.  . triamcinolone cream (KENALOG) 0.1 % Apply 1 application topically 2 (two) times daily.  . [DISCONTINUED] ketoconazole (NIZORAL) 2 % cream Apply 1 application topically daily.  . [DISCONTINUED] nicotine (NICODERM CQ - DOSED IN MG/24 HOURS) 21 mg/24hr patch Place 1 patch (21 mg total) onto the skin daily.   No facility-administered encounter medications on file as of 06/12/2019.

## 2019-06-12 ENCOUNTER — Ambulatory Visit (INDEPENDENT_AMBULATORY_CARE_PROVIDER_SITE_OTHER): Payer: PPO | Admitting: Family Medicine

## 2019-06-12 ENCOUNTER — Encounter: Payer: Self-pay | Admitting: Family Medicine

## 2019-06-12 ENCOUNTER — Ambulatory Visit (INDEPENDENT_AMBULATORY_CARE_PROVIDER_SITE_OTHER)
Admission: RE | Admit: 2019-06-12 | Discharge: 2019-06-12 | Disposition: A | Discharge status: Home / Self Care | Payer: PPO | Source: Ambulatory Visit | Attending: Family Medicine | Admitting: Family Medicine

## 2019-06-12 ENCOUNTER — Other Ambulatory Visit: Payer: Self-pay

## 2019-06-12 VITALS — BP 90/60 | HR 97 | Temp 97.2°F | Ht 69.0 in | Wt 212.5 lb

## 2019-06-12 DIAGNOSIS — M79674 Pain in right toe(s): Secondary | ICD-10-CM

## 2019-06-12 DIAGNOSIS — M25561 Pain in right knee: Secondary | ICD-10-CM

## 2019-06-12 DIAGNOSIS — S99921A Unspecified injury of right foot, initial encounter: Secondary | ICD-10-CM | POA: Diagnosis not present

## 2019-06-12 DIAGNOSIS — M79671 Pain in right foot: Secondary | ICD-10-CM | POA: Diagnosis not present

## 2019-06-12 IMAGING — DX RIGHT KNEE - COMPLETE 4+ VIEW
4 series · 4 of 4 positions shown · non-contrast
Comparison: None.

CLINICAL DATA: Right knee pain, trauma

EXAM:
RIGHT KNEE - COMPLETE 4+ VIEW

[knee ap]
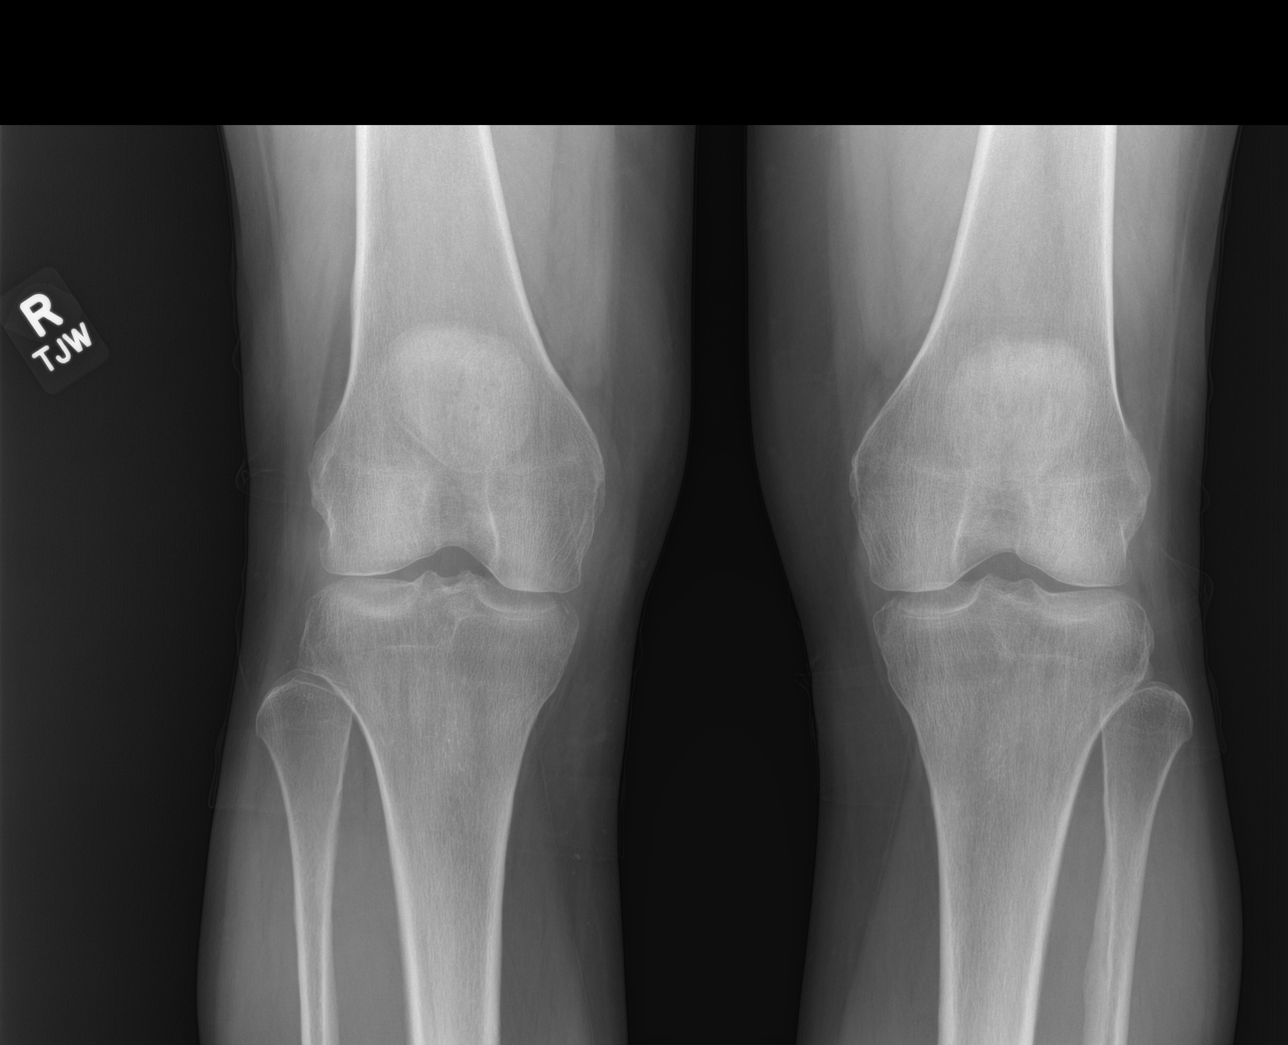

[knee tunnel]
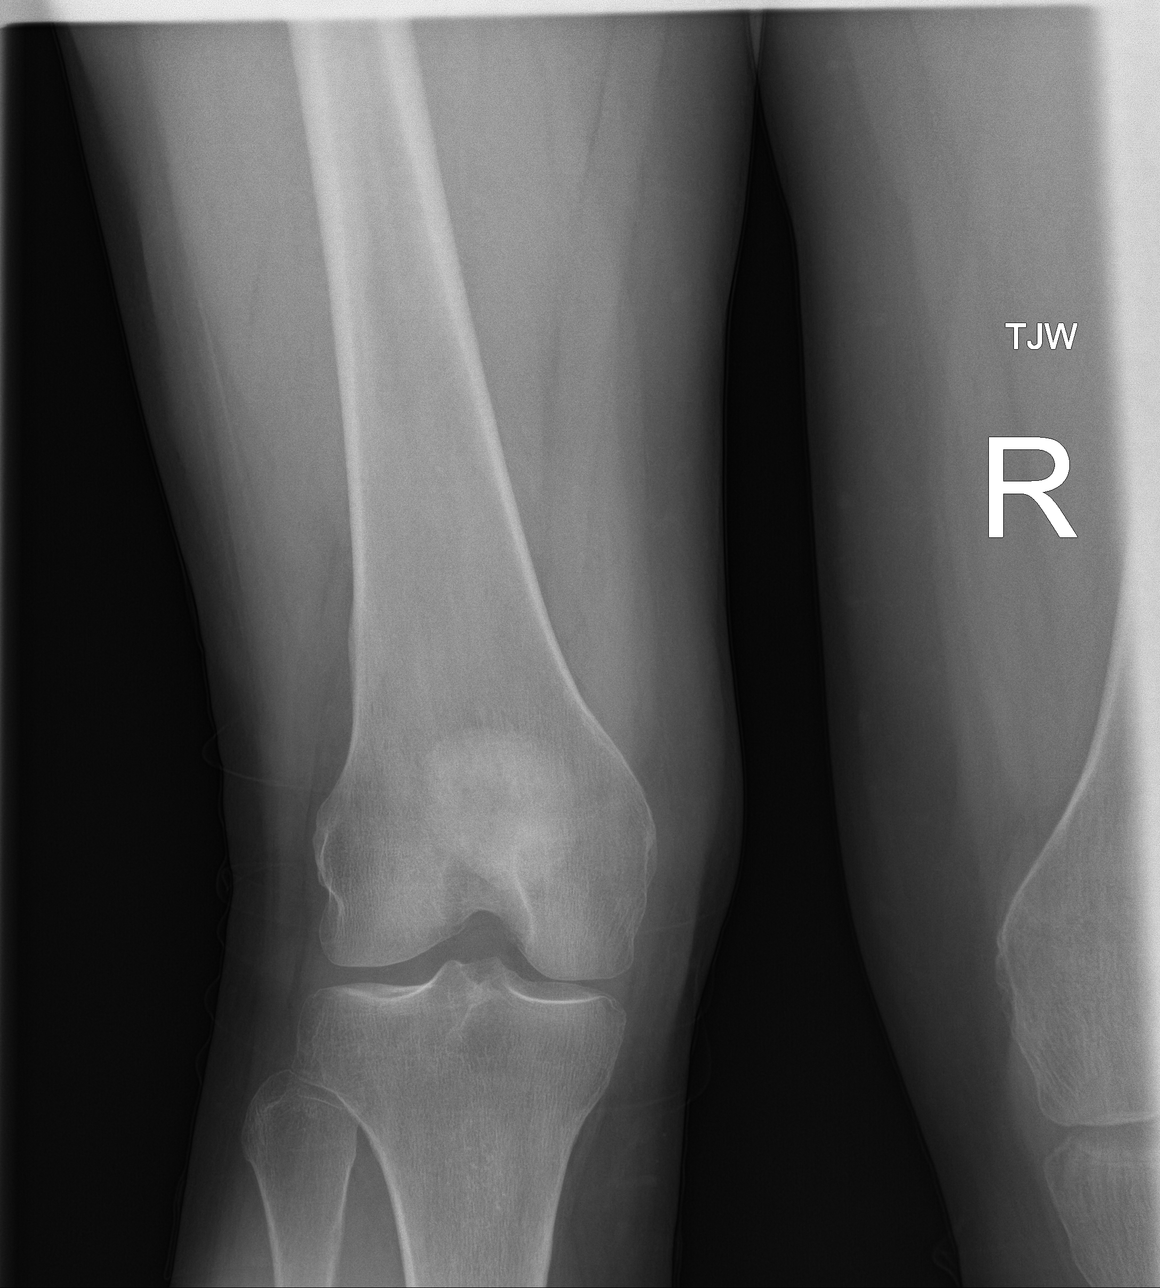

[knee lat]
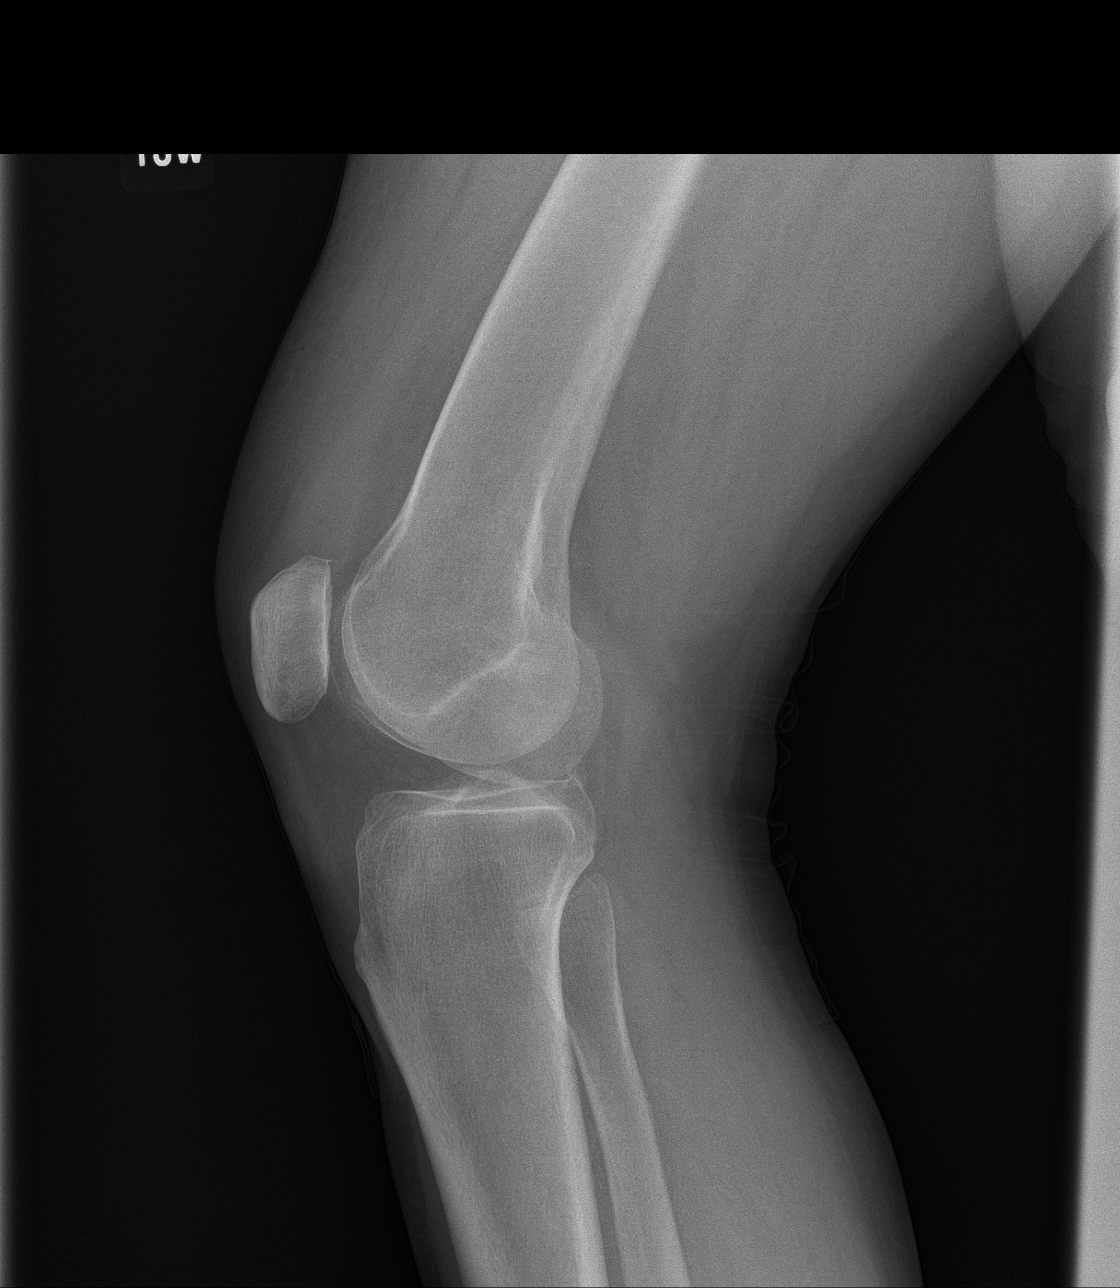

[patella skyline]
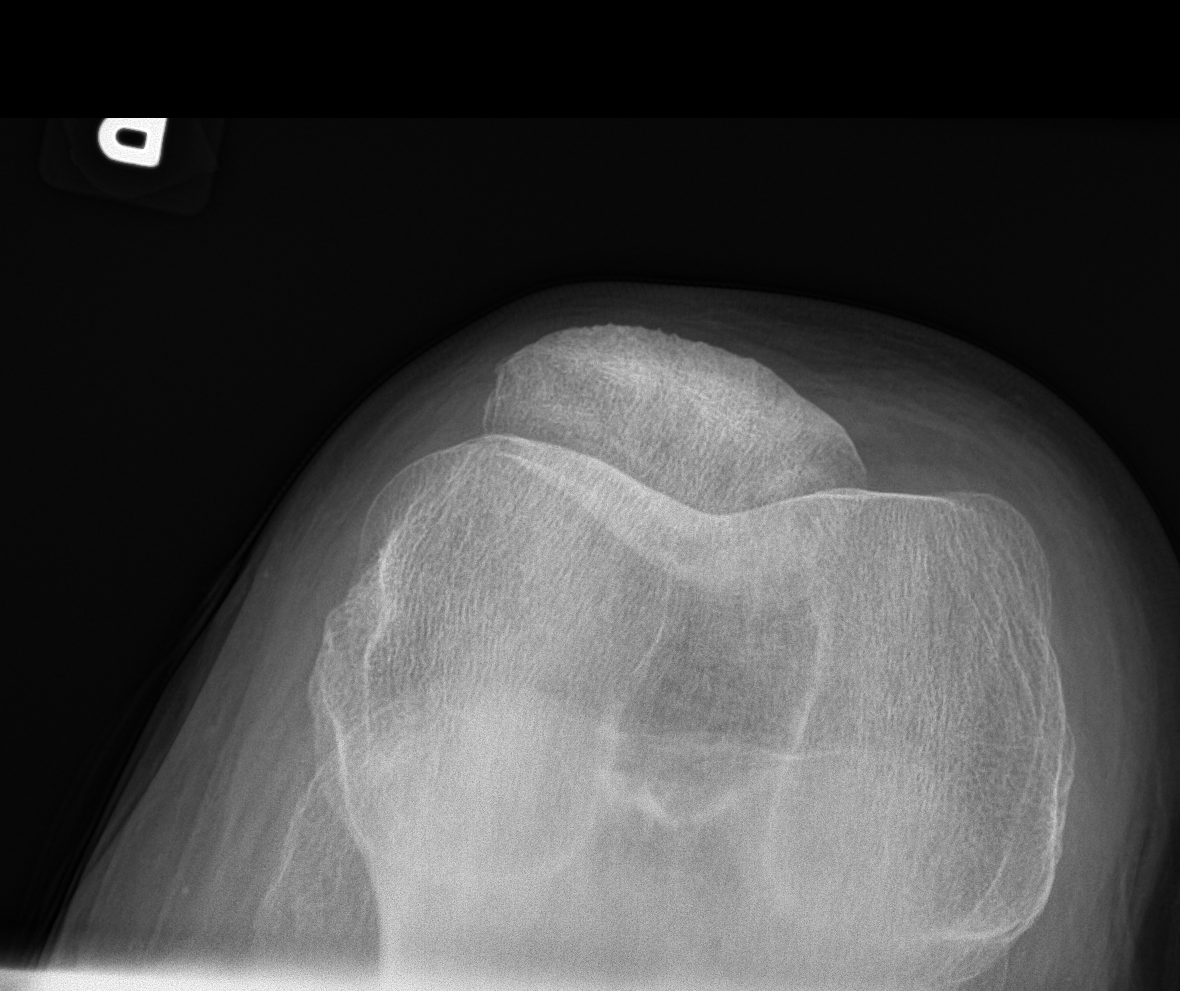

[4 of 4 positions shown; findings below may reference images not displayed]

FINDINGS: No fracture or malalignment. Trace knee effusion. Mild
patellofemoral degenerative change.
IMPRESSION: No acute osseous abnormality.  Trace knee effusion

## 2019-06-12 IMAGING — DX RIGHT FOOT COMPLETE - 3+ VIEW
3 series · 3 of 3 positions shown · non-contrast
Comparison: [DATE], [DATE], [DATE]

CLINICAL DATA: Trauma, pain

EXAM:
RIGHT FOOT COMPLETE - 3+ VIEW

[foot ap]
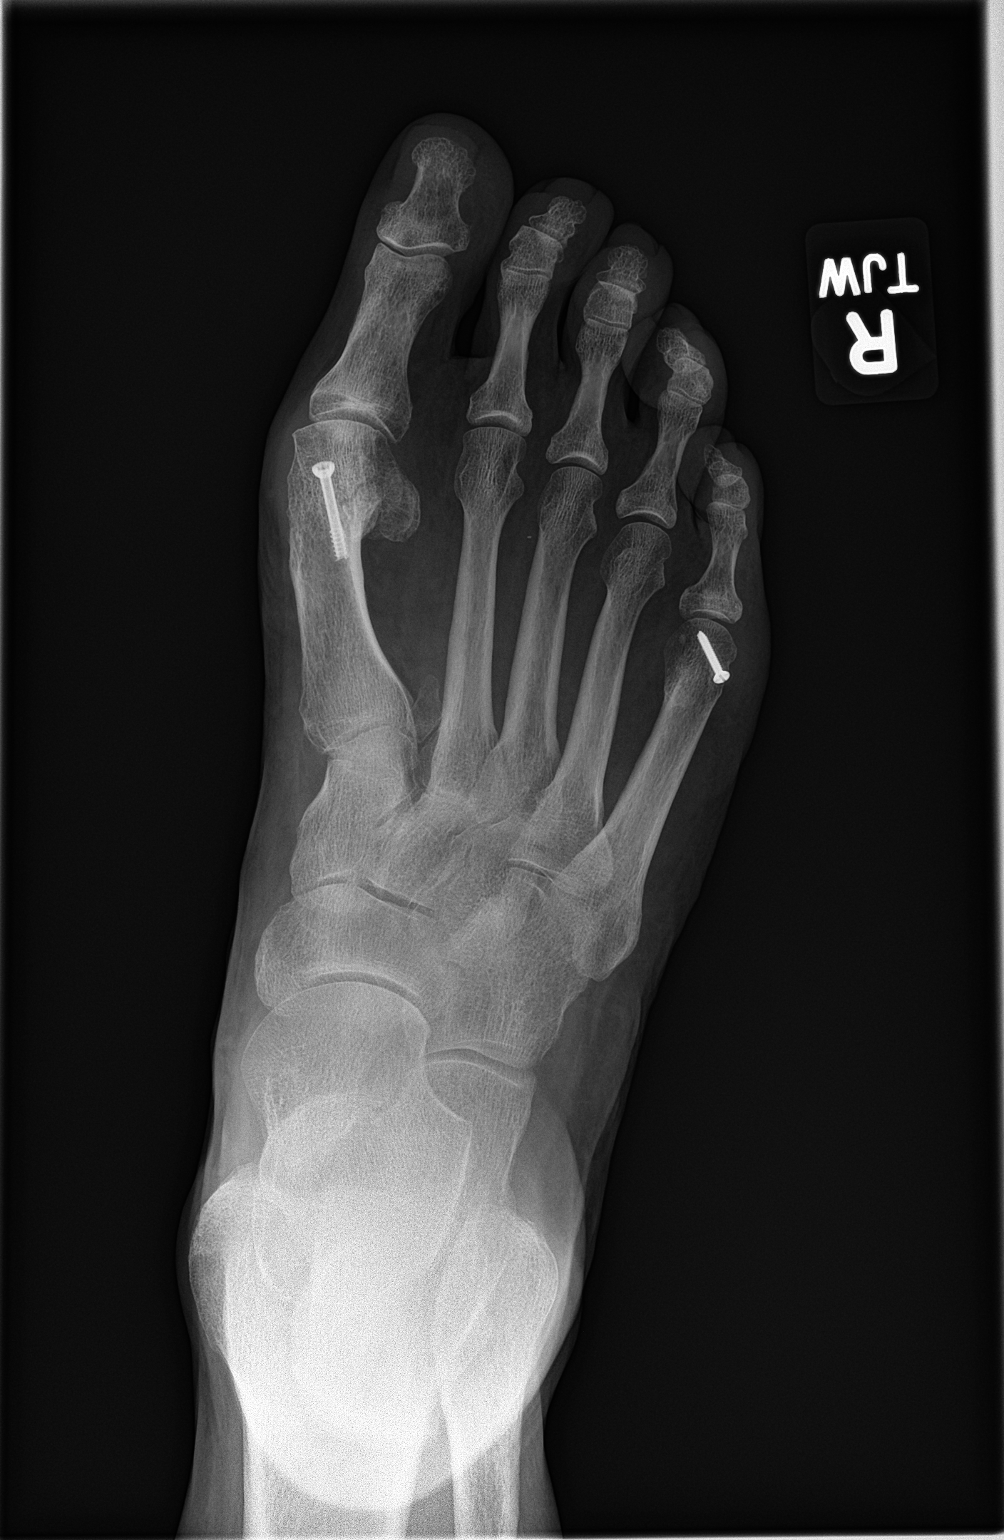

[foot obl]
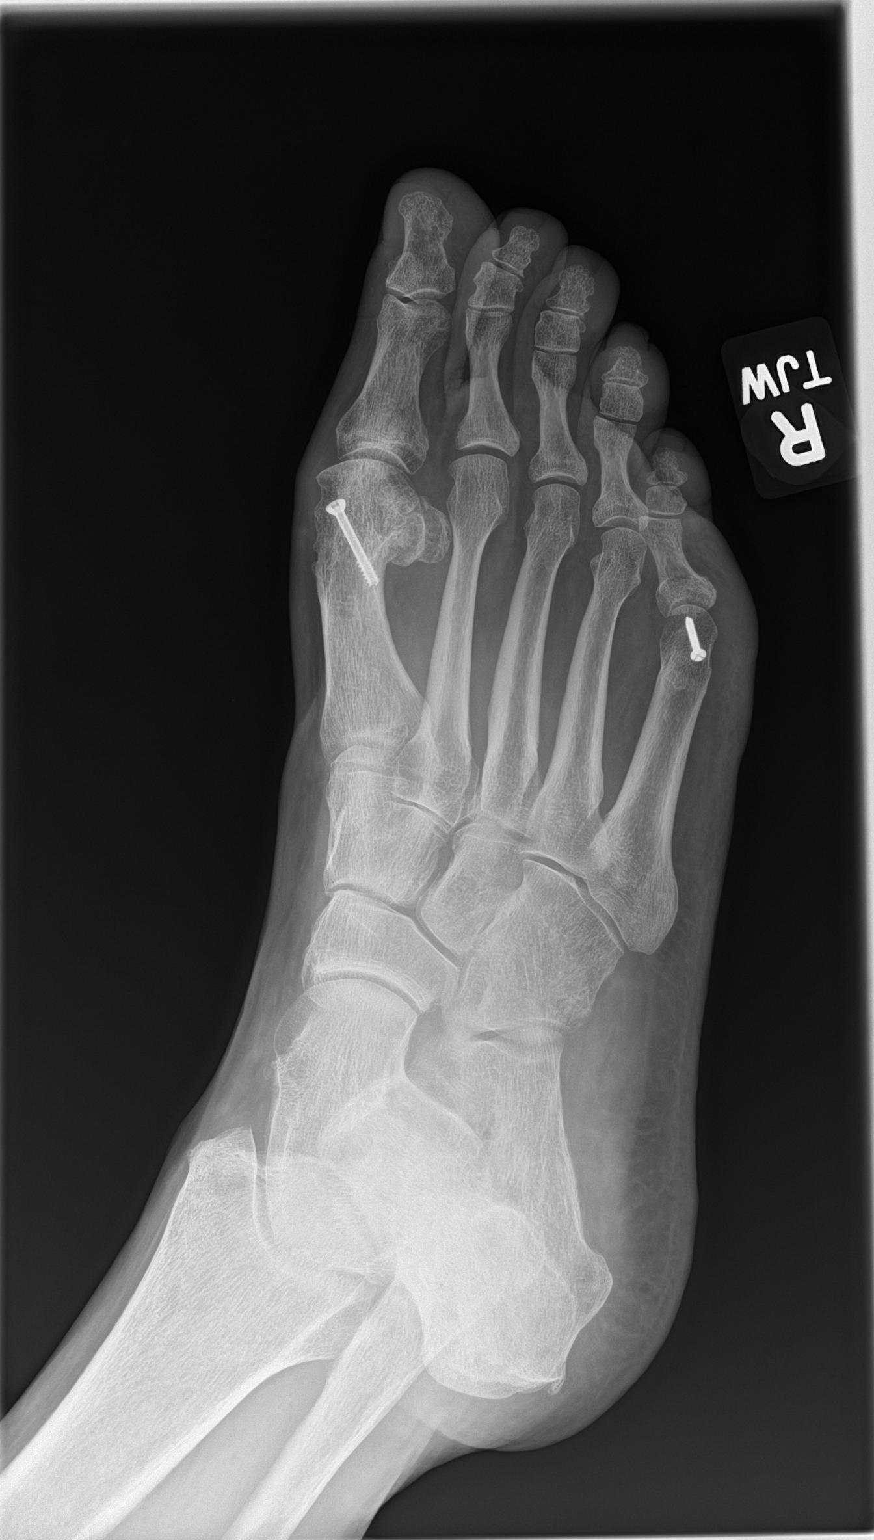

[foot lat]
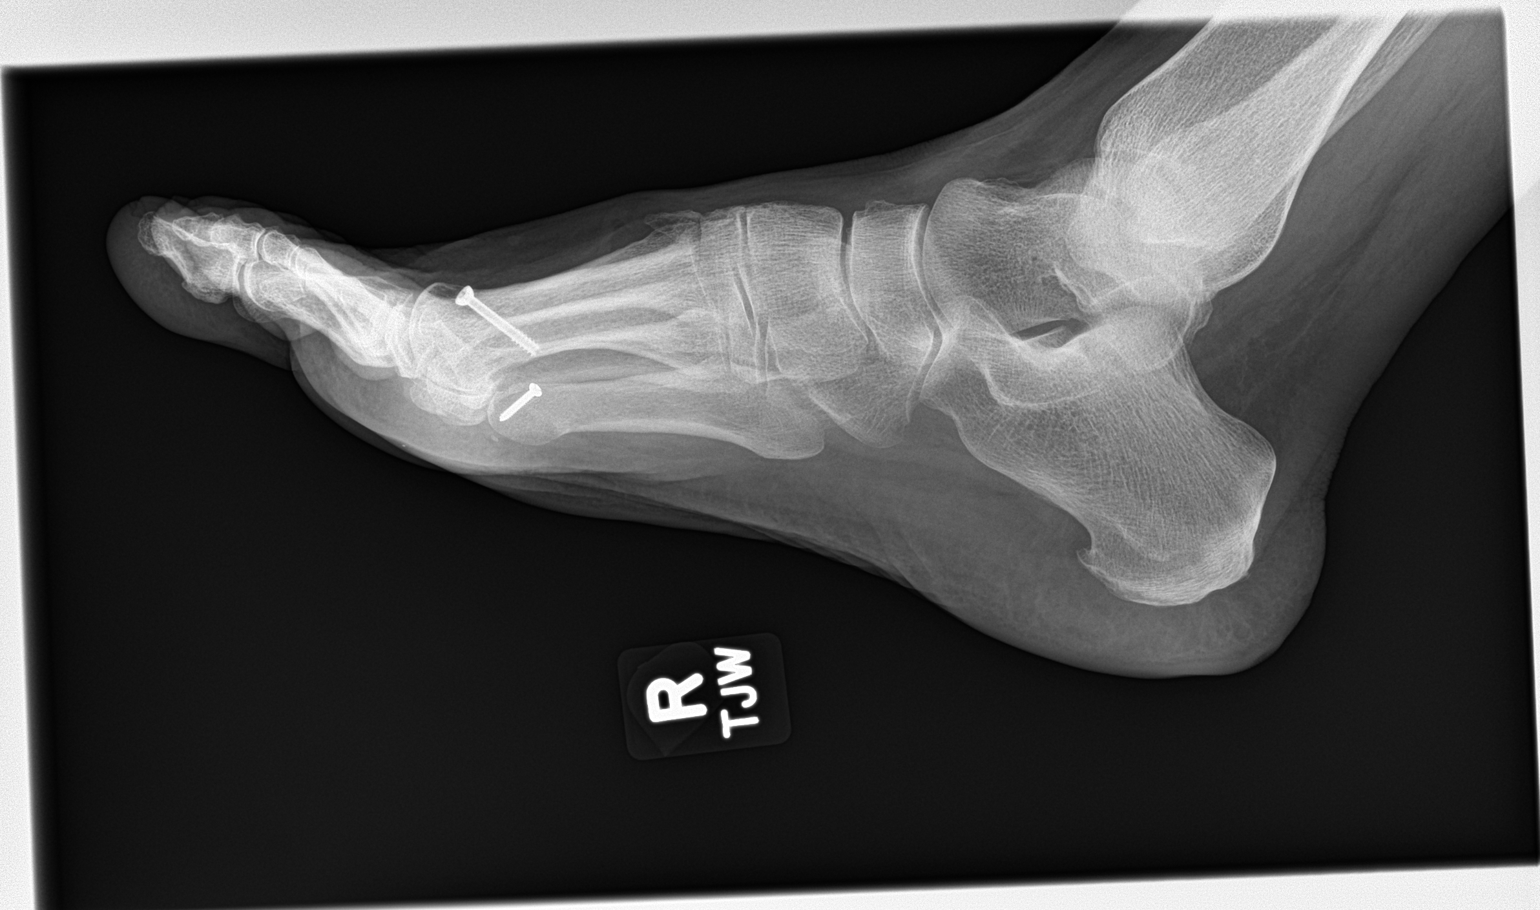

[3 of 3 positions shown; findings below may reference images not displayed]

FINDINGS: Possible oblique lucency at the head of the first metatarsal on one
view. Postsurgical deformity of the distal first metatarsal.
Fixating screw in the distal first and fifth metatarsals. No
subluxation. No radiopaque foreign body in the soft tissues. Small
plantar calcaneal spur.
IMPRESSION: Possible linear lucency/nondisplaced fracture at the head of the
first metatarsal on one view.

## 2019-06-13 ENCOUNTER — Encounter: Payer: Self-pay | Admitting: Family Medicine

## 2019-06-28 ENCOUNTER — Encounter: Payer: Self-pay | Admitting: Primary Care

## 2019-06-28 ENCOUNTER — Encounter: Payer: Self-pay | Admitting: *Deleted

## 2019-06-28 ENCOUNTER — Ambulatory Visit (INDEPENDENT_AMBULATORY_CARE_PROVIDER_SITE_OTHER): Payer: PPO | Admitting: Primary Care

## 2019-06-28 ENCOUNTER — Other Ambulatory Visit: Payer: Self-pay

## 2019-06-28 VITALS — Temp 99.4°F

## 2019-06-28 DIAGNOSIS — R110 Nausea: Secondary | ICD-10-CM

## 2019-06-28 DIAGNOSIS — Z20822 Contact with and (suspected) exposure to covid-19: Secondary | ICD-10-CM

## 2019-06-28 DIAGNOSIS — R5383 Other fatigue: Secondary | ICD-10-CM | POA: Diagnosis not present

## 2019-06-28 DIAGNOSIS — M791 Myalgia, unspecified site: Secondary | ICD-10-CM | POA: Diagnosis not present

## 2019-06-28 DIAGNOSIS — R05 Cough: Secondary | ICD-10-CM

## 2019-06-28 NOTE — Patient Instructions (Signed)
Go to the hospital if your breathing gets bad, you start feeling very weak, your fevers increase to 101 or higher continuously.   Go to the [facility] building at [facility] for your Covid-19 test.  It was a pleasure to see you today! Allie Bossier, NP-C

## 2019-06-28 NOTE — Progress Notes (Signed)
Subjective:    Patient ID: Emily Moon, female    DOB: 08/16/1961, 58 y.o.   MRN: [id number]  HPI  Virtual Visit via Video Note  I connected with Emily Moon on 06/28/19 at  9:40 AM EDT by a video enabled telemedicine application and verified that I am speaking with the correct person using two identifiers.  Location: Patient: Home Provider: Office   I discussed the limitations of evaluation and management by telemedicine and the availability of in person appointments. The patient expressed understanding and agreed to proceed.  History of Present Illness:  Emily Moon is a 58 year old female with a history of multiple sclerosis, asthma, chronic pain, osteoarthritis, bipolar disorder, tobacco abuse, B12 deficiency who presents today with a chief complaint of fever.  She also reports dry cough, nausea, body aches, fatigue, altered taste/smell, loose stools. She went sky diving on 06/05/2019 in [place] while visiting family, everyone wore their masks. She had her influenza vaccination two days ago. Four days ago she went out to [employer], ate at the food court and did some shopping.  to a food court at [employer], wore her mask and stayed away from others.  Symptoms began three days ago with a fever, then other symptoms have followed. Temperatures are ranging 99.6-100.1. She's been taking Tylenol, Advil with some improvement. She is taking her Norco routinely.   Observations/Objective:  Alert and oriented. Appears well, not sickly. No distress. Speaking in complete sentences. No cough.   Assessment and Plan:  Symptoms suspicious for Covid-19, especially given recent travel and potential exposure. Appears stable during visit today. Recommended to continue Advil for fevers. Lab order placed for Covid-19 testing. She is stable for outpatient treatment, but strict ED precautions provided.  Follow Up Instructions:  Go to the hospital if your breathing gets  bad, you start feeling very weak, your fevers increase to 101 or higher continuously.   Go to the [facility] building at [facility] for your Covid-19 test.  It was a pleasure to see you today! Allie Bossier, NP-C    I discussed the assessment and treatment plan with the patient. The patient was provided an opportunity to ask questions and all were answered. The patient agreed with the plan and demonstrated an understanding of the instructions.   The patient was advised to call back or seek an in-person evaluation if the symptoms worsen or if the condition fails to improve as anticipated.   [name], NP    Review of Systems  Constitutional: Positive for chills, fatigue and fever.  Respiratory: Positive for cough. Negative for shortness of breath.   Gastrointestinal: Positive for nausea.  Musculoskeletal: Positive for arthralgias and myalgias.       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   Hypercholesterolemia    Multiple sclerosis (HCC)    weakness   Nephrolithiasis    kidney stones   Osteoporosis  osteoarthritis   Pneumonia    PONV (postoperative nausea and vomiting)    no problem after cataract surgery   Psoriasis    Renal stone    Shortness of breath dyspnea    wheezing   Sleep apnea 2012   sleep study / slight, no interventions   Urinary frequency      Social History   Socioeconomic History   Marital status: Divorced    Spouse name: Not on file   Number of children: 1   Years of education: Not on file    Highest education level: Not on file  Occupational History   Occupation: Therapist, 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[employer]: [place] resource strain: Not on file   Food insecurity    Worry: Not on file    Inability: Not on file   Transportation needs    Medical: Not on file    Non-medical: Not on file  Tobacco Use   Smoking status: Former Smoker    Years: 25.00    Types: Cigarettes    Quit date: 11/16/2012    Years since quitting: 6.6   Smokeless tobacco: Never Used   Tobacco comment: occasional use  Substance and Sexual Activity   Alcohol use: No    Alcohol/week: 0.0 standard drinks   Drug use: No    Types: Methylphenidate   Sexual activity: Never  Lifestyle   Physical activity    Days per week: Not on file    Minutes per session: Not on file   Stress: Not on file  Relationships   Social connections    Talks on phone: Not on file    Gets together: Not on file    Attends religious service: Not on file    Active member of club or organization: Not on file    Attends meetings of clubs or organizations: Not on file    Relationship status: Not on file   Intimate partner violence    Fear of current or ex partner: Not on file    Emotionally abused: Not on file    Physically abused: Not on file    Forced sexual activity: Not on file  Other Topics Concern   Not on file  Social History Narrative   Not on file    Past Surgical History:  Procedure Laterality Date   CATARACT EXTRACTION W/PHACO Left 05/21/2015   Procedure: CATARACT EXTRACTION PHACO AND INTRAOCULAR LENS PLACEMENT ([place]);  Surgeon: Birder Robson, MD;  Location: [facility];  Service: Ophthalmology;  Laterality: Left;  [place] 00:35 AP% 22.9 CDE 8.11 fluid pack lot #[id number]   CATARACT EXTRACTION W/PHACO Right 06/04/2015   Procedure: CATARACT EXTRACTION PHACO AND INTRAOCULAR LENS PLACEMENT ([facility]);  Surgeon: Birder Robson, MD;  Location: [facility];  Service: Ophthalmology;   Laterality: Right;  US:00:48 AP%: 10.5 CDE:5.08 Fluid lot #[id number]   CYSTOSCOPY/URETEROSCOPY/HOLMIUM LASER/STENT PLACEMENT Bilateral 09/22/2016   Procedure: CYSTOSCOPY/URETEROSCOPY/HOLMIUM LASER/STENT PLACEMENT;  Surgeon: Hollice Espy, MD;  Location: [facility];  Service: Urology;  Laterality: Bilateral;   EYE SURGERY  2015   tissue biopsy   FOOT SURGERY  2015   JOINT REPLACEMENT Left 2013   hip replacement   LITHOTRIPSY     PTOSIS REPAIR Bilateral 02/18/2016   Procedure: BILATERAL PTOSIS REPAIR UPPER EYELIDS;  Surgeon: Karle Starch, MD;  Location: [place];  Service: Ophthalmology;  Laterality: Bilateral;  LEAVE PT EARLY AM   thumb surgery Right    TONSILLECTOMY  1973  Family History  Problem Relation Age of Onset   Cancer Father        Abdomen with mastasis   Cancer Mother    Heart disease Mother    Kidney disease Neg Hx    Bladder Cancer Neg Hx    Prostate cancer Neg Hx    Kidney cancer Neg Hx     Allergies  Allergen Reactions   Albuterol Shortness Of Breath and Other (See Comments)    Makes pt feel jittery/ tacycardic   Crestor [Rosuvastatin] Other (See Comments)    Joint pain, muscle pain, and hair loss   Halcion [Triazolam] Other (See Comments)    Dizziness,headaches,bladder problems   Levaquin [Levofloxacin In D5w] Diarrhea and Itching    Shoulder pain   Naproxen Sodium Swelling    Patient tolerates in small doses   Tylenol [Acetaminophen] Swelling    Patient tolerates in small doses   Cefaclor Other (See Comments)    Doesn't remember---unsure if actually allergic    Diclofenac Sodium Other (See Comments)    "made very sick"   Sulfa Antibiotics Itching    Unsure of reaction possibly itching   Tramadol Itching and Nausea And Vomiting   Aripiprazole Other (See Comments)    Muscle tension/cramping   Ibuprofen Swelling    Patient tolerates in small doses    Current Outpatient Medications on File Prior to Visit    Medication Sig Dispense Refill   ASPERCREME LIDOCAINE EX Apply 1 application topically 4 (four) times daily as needed (for pain.).      aspirin 325 MG tablet Take 325 mg by mouth every 4 (four) hours as needed for headache.      atorvastatin (LIPITOR) 20 MG tablet Take 1 tablet (20 mg total) by mouth daily. For cholesterol. 90 tablet 0   azelastine (ASTELIN) 0.1 % nasal spray PLACE 2 SPRAYS INTO BOTH NOSTRILS 2 (TWO) TIMES DAILY 30 mL 0   BIOTIN PO Take 200 mg by mouth daily.      calcipotriene-betamethasone (TACLONEX) ointment Apply 1 application topically daily as needed (for psorasis).      Cholecalciferol (VITAMIN D3) 10000 units capsule Take 30,000 Units by mouth daily.      clobetasol cream (TEMOVATE) 5.46 % Apply 1 application topically 2 (two) times daily. 30 g 2   cyanocobalamin (,VITAMIN B-12,) 1000 MCG/ML injection Inject 1 mL (1,000 mcg total) into the muscle every 3 (three) months. 1 mL 0   diclofenac sodium (VOLTAREN) 1 % GEL Apply 4 g topically 4 (four) times daily. 500 g 5   docusate sodium (COLACE) 100 MG capsule Take 1 capsule (100 mg total) by mouth 2 (two) times daily. 60 capsule 0   DULoxetine (CYMBALTA) 20 MG capsule Take 2 capsules (40 mg total) by mouth at bedtime. 180 capsule 3   HYDROcodone-acetaminophen (NORCO) 10-325 MG tablet Take 1 tablet by mouth every 8 (eight) hours as needed for moderate pain or severe pain. Supervising physician; Dr Eliezer Lofts; DEA# [id number] 90 tablet 0   hydroquinone 4 % cream 1 APPLICATION TOPICALLY DAILY AS NEEDED FOR BLEMISHES.  3   hydrOXYzine (VISTARIL) 50 MG capsule TAKE 1 CAPSLE BY MOUTH AT NOON, 1 AT 6 PM, AND 1 AS NEEDED 270 capsule 2   lamoTRIgine (LAMICTAL) 150 MG tablet Take 2 tablets (300 mg total) by mouth at bedtime. 60 tablet 5   levalbuterol (XOPENEX HFA) 45 MCG/ACT inhaler Inhale 1-2 puffs into the lungs every 6 (six) hours as needed for wheezing. 1 Inhaler 0  levalbuterol (XOPENEX) 1.25 MG/3ML nebulizer  solution USE 1 VIAL VIA NEBULIZER EVERY 4 HOURS AS NEEDED FOR WHEEZING OR SHORTNESS OF BREATH 270 mL 1   Lysine 1000 MG TABS Take 2,000-4,000 mg by mouth at bedtime. 2000 mg scheduled at bedtime and patient will take 4000 mg if she has outbreak     Magnesium 500 MG TABS Take 1 tablet by mouth at bedtime.     neomycin-bacitracin-polymyxin (NEOSPORIN) [phone] ointment Apply 1 application topically 4 (four) times daily as needed (for cut/scrapes.).     pantoprazole (PROTONIX) 40 MG tablet Take 1 tablet (40 mg total) by mouth daily. For heartburn. 90 tablet 1   phenazopyridine (AZO-TABS) 95 MG tablet Take 95 mg by mouth 3 (three) times daily as needed for pain.     polyethylene glycol powder (GLYCOLAX/MIRALAX) powder MIX 17 GRAMS (1 CAPFUL) WITH 4-8 OZ OF LIQUID AND TAKE BY MOUTH TWICE DAILY AS NEEDED 527 g 0   temazepam (RESTORIL) 30 MG capsule TAKE ONE TO TWO CAPSULES BY MOUTH NIGHTLY AT BEDTIME 60 capsule 4   tiZANidine (ZANAFLEX) 4 MG tablet TAKE 2 TABLETS (8 MG TOTAL) BY MOUTH EVERY 6 (SIX) HOURS AS NEEDED FOR MUSCLE SPASMS. 720 tablet 0   traZODone (DESYREL) 50 MG tablet Take 2 tablets (100 mg total) by mouth at bedtime. 60 tablet 7   triamcinolone cream (KENALOG) 0.1 % Apply 1 application topically 2 (two) times daily. 30 g 0   No current facility-administered medications on file prior to visit.     Temp 99.4 F (37.4 C) (Oral)    LMP 08/23/2014    Objective:   Physical Exam  Constitutional: She is oriented to person, place, and time. She appears well-nourished. She does not have a sickly appearance. She does not appear ill.  Respiratory: Effort normal.  No cough during  Neurological: She is alert and oriented to person, place, and time.  Psychiatric: She has a normal mood and affect.           Assessment & Plan:

## 2019-06-30 LAB — NOVEL CORONAVIRUS, NAA: SARS-CoV-2, NAA: NOT DETECTED

## 2019-07-05 ENCOUNTER — Other Ambulatory Visit: Payer: Self-pay

## 2019-07-05 ENCOUNTER — Ambulatory Visit (INDEPENDENT_AMBULATORY_CARE_PROVIDER_SITE_OTHER): Payer: PPO | Admitting: Psychiatry

## 2019-07-05 DIAGNOSIS — G4709 Other insomnia: Secondary | ICD-10-CM

## 2019-07-05 DIAGNOSIS — F329 Major depressive disorder, single episode, unspecified: Secondary | ICD-10-CM

## 2019-07-05 DIAGNOSIS — F3160 Bipolar disorder, current episode mixed, unspecified: Secondary | ICD-10-CM | POA: Diagnosis not present

## 2019-07-05 DIAGNOSIS — M1990 Unspecified osteoarthritis, unspecified site: Secondary | ICD-10-CM

## 2019-07-05 MED ORDER — LAMOTRIGINE 150 MG PO TABS: 300.0000 mg | ORAL_TABLET | Freq: Every day | ORAL | 5 refills | Status: DC

## 2019-07-05 MED ORDER — TEMAZEPAM 30 MG PO CAPS: ORAL_CAPSULE | ORAL | 4 refills | Status: DC

## 2019-07-05 MED ORDER — DULOXETINE HCL 60 MG PO CPEP: 60.0000 mg | ORAL_CAPSULE | Freq: Every day | ORAL | 3 refills | Status: DC

## 2019-07-05 MED ORDER — TRAZODONE HCL 50 MG PO TABS: 100.0000 mg | ORAL_TABLET | Freq: Every day | ORAL | 7 refills | Status: DC

## 2019-07-05 NOTE — Progress Notes (Signed)
Psychiatric Initial Adult Assessment   Patient Identification: Emily Moon MRN:  [id number] Date of Evaluation:  07/05/2019 Referral Source: From the community Chief Complaint: Feels exhausted  This patient is a patient has been treated in this setting for years for bipolar disorder mixed.  She clearly has a characterological component.  The patient still has a lot of dental pain that is left over from her dental surgery.  She has had a very intense last few months.  She went to visit her son in [place].  She got to see her grandson as well.  She had a good trip.  She went skydiving.  The patient is very erratic.  She takes risk.  Her MS symptoms seem to be on hold.  She is sleeping fairly well.  Fortunately she no longer is having any fainting episodes.  The patient has a great deal of problems with her sleep.  She says she is diagnosed with sleep apnea if she cannot tolerate a CPAP machine.  She takes high-dose Restoril.  Patient has no falls.  She says she feels more and more depressed.  She is not suicidal.  I do not believe she is anhedonic.  She is gained 20 pounds in the last 3 or 4 months.  I think overall it is evident that she is having more depression symptoms than anything.  She is generally erratic, hyper, and elated.  She is not acutely manic at all.  Patient shows no evidence of psychosis.  She is not grandiose.  Patient also has a significant pain problem and she takes hydrocodone.  Today she shares that she is starting to drink more on an erratic basis.  She claims she does not drink every day but she drinks 2 or 3 glasses of gin.  This will be directly addressed on her next visit in a 5 weeks.   Patient: Emily Moon  Procedure(s) Performed: * No surgery found *  Anesthes  Patient location:   Post pain:   Post assessment:   Last Vitals:  There were no vitals filed for this visit.  Post vital signs:   Level of consciousness:   Complications: . Associated  Signs/Symptoms: Depression Symptoms:  hopelessness, (Hypo) Manic Symptoms:   Anxiety Symptoms:   Psychotic Symptoms:   PTSD Symptoms:   Past Psychiatric History: The patient was seen in a psychiatric office and takes multiple medications she's not been recently hospitalized.  Previous Psychotropic Medications: Yes   Substance Abuse History in the last 12 months:  No.  Consequences of Substance Abuse: Negative  Past Medical History:  Past Medical History:  Diagnosis Date  . Arthritis    osteo  . Asthma   . Bipolar disorder ([place]) 05/21/14  . Cataracts, bilateral   . DDD (degenerative disc disease), cervical    also back  . Depression   . Dizziness    Positional  . Edema    feet/legs  . Fibromyalgia syndrome   . Fungal infection    Finger nails  . GERD (gastroesophageal reflux disease)   . Gout   . Headache    seasonal allergies  . Heart palpitations   . 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 Hypercholesterolemia   . Multiple sclerosis (HCC)    weakness  . Nephrolithiasis    kidney stones  . Osteoporosis    osteoarthritis  . Pneumonia   . PONV (postoperative nausea and vomiting)    no problem after cataract surgery  . Psoriasis   .  Renal stone   . Shortness of breath dyspnea    wheezing  . Sleep apnea 2012   sleep study / slight, no interventions  . Urinary frequency     Past Surgical History:  Procedure Laterality Date  . CATARACT EXTRACTION W/PHACO Left 05/21/2015   Procedure: CATARACT EXTRACTION PHACO AND INTRAOCULAR LENS PLACEMENT ([facility]);  Surgeon: Birder Robson, MD;  Location: [facility];  Service: Ophthalmology;  Laterality: Left;  [place] 00:35 AP% 22.9 CDE 8.11 fluid pack lot #[id number] H  . CATARACT EXTRACTION W/PHACO Right 06/04/2015   Procedure: CATARACT EXTRACTION PHACO AND INTRAOCULAR LENS PLACEMENT ([facility]);  Surgeon: Birder Robson, MD;  Location: [facility];  Service: Ophthalmology;  Laterality: Right;  US:00:48 AP%: 10.5 CDE:5.08 Fluid lot  #[id number] H  . CYSTOSCOPY/URETEROSCOPY/HOLMIUM LASER/STENT PLACEMENT Bilateral 09/22/2016   Procedure: CYSTOSCOPY/URETEROSCOPY/HOLMIUM LASER/STENT PLACEMENT;  Surgeon: Hollice Espy, MD;  Location: [facility];  Service: Urology;  Laterality: Bilateral;  . EYE SURGERY  2015   tissue biopsy  . FOOT SURGERY  2015  . JOINT REPLACEMENT Left 2013   hip replacement  . LITHOTRIPSY    . PTOSIS REPAIR Bilateral 02/18/2016   Procedure: BILATERAL PTOSIS REPAIR UPPER EYELIDS;  Surgeon: Karle Starch, MD;  Location: [place];  Service: Ophthalmology;  Laterality: Bilateral;  LEAVE PT EARLY AM  . thumb surgery Right   . TONSILLECTOMY  1973    Family Psychiatric History:   Family History:  Family History  Problem Relation Age of Onset  . Cancer Father        Abdomen with mastasis  . Cancer Mother   . Heart disease Mother   . Kidney disease Neg Hx   . Bladder Cancer Neg Hx   . Prostate cancer Neg Hx   . Kidney cancer Neg Hx     Social History:   Social History   Socioeconomic History  . Marital status: Divorced    Spouse name: Not on file  . Number of children: 1  . Years of education: Not on file  . Highest education level: Not on file  Occupational History  . 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 Financial resource strain: Not on file  . Food insecurity    Worry: Not on file    Inability: Not on file  . Transportation needs    Medical: Not on file    Non-medical: Not on file  Tobacco Use  . Smoking status: Former Smoker    Years: 25.00    Types: Cigarettes    Quit date: 11/16/2012    Years since quitting: 6.6  . Smokeless tobacco: Never Used  . Tobacco comment: occasional use  Substance and Sexual Activity  . Alcohol use: No    Alcohol/week: 0.0 standard drinks  . Drug use: No    Types: Methylphenidate  . Sexual activity: Never  Lifestyle  . Physical activity    Days per week: Not on file    Minutes per session: Not on  file  . Stress: Not on file  Relationships  . Social Herbalist on phone: Not on file    Gets together: Not on file    Attends religious service: Not on file    Active member of club or organization: Not on file    Attends meetings of clubs or organizations: Not on file    Relationship status: Not on file  Other Topics Concern  . Not on file  Social History  Narrative  . Not on file    Additional Social History:   Allergies:   Allergies  Allergen Reactions  . Albuterol Shortness Of Breath and Other (See Comments)    Makes pt feel jittery/ tacycardic  . Crestor [Rosuvastatin] Other (See Comments)    Joint pain, muscle pain, and hair loss  . Halcion [Triazolam] Other (See Comments)    Dizziness,headaches,bladder problems  . Levaquin [Levofloxacin In D5w] Diarrhea and Itching    Shoulder pain  . Naproxen Sodium Swelling    Patient tolerates in small doses  . Tylenol [Acetaminophen] Swelling    Patient tolerates in small doses  . Cefaclor Other (See Comments)    Doesn't remember---unsure if actually allergic   . Diclofenac Sodium Other (See Comments)    "made very sick"  . Sulfa Antibiotics Itching    Unsure of reaction possibly itching  . Tramadol Itching and Nausea And Vomiting  . Aripiprazole Other (See Comments)    Muscle tension/cramping  . Ibuprofen Swelling    Patient tolerates in small doses    Metabolic Disorder Labs: Lab Results  Component Value Date   HGBA1C 5.8 05/10/2019   No results found for: PROLACTIN Lab Results  Component Value Date   CHOL 353 (H) 05/10/2019   TRIG 210.0 (H) 05/10/2019   HDL 61.20 05/10/2019   CHOLHDL 6 05/10/2019   VLDL 42.0 (H) 05/10/2019   LDLCALC 223 (H) 11/13/2014     Current Medications: Current Outpatient Medications  Medication Sig Dispense Refill  . ASPERCREME LIDOCAINE EX Apply 1 application topically 4 (four) times daily as needed (for pain.).     Marland Kitchen aspirin 325 MG tablet Take 325 mg by mouth every 4  (four) hours as needed for headache.     Marland Kitchen atorvastatin (LIPITOR) 20 MG tablet Take 1 tablet (20 mg total) by mouth daily. For cholesterol. 90 tablet 0  . azelastine (ASTELIN) 0.1 % nasal spray PLACE 2 SPRAYS INTO BOTH NOSTRILS 2 (TWO) TIMES DAILY 30 mL 0  . BIOTIN PO Take 200 mg by mouth daily.     . calcipotriene-betamethasone (TACLONEX) ointment Apply 1 application topically daily as needed (for psorasis).     . Cholecalciferol (VITAMIN D3) 10000 units capsule Take 30,000 Units by mouth daily.     . clobetasol cream (TEMOVATE) 2.53 % Apply 1 application topically 2 (two) times daily. 30 g 2  . cyanocobalamin (,VITAMIN B-12,) 1000 MCG/ML injection Inject 1 mL (1,000 mcg total) into the muscle every 3 (three) months. 1 mL 0  . diclofenac sodium (VOLTAREN) 1 % GEL Apply 4 g topically 4 (four) times daily. 500 g 5  . docusate sodium (COLACE) 100 MG capsule Take 1 capsule (100 mg total) by mouth 2 (two) times daily. 60 capsule 0  . DULoxetine (CYMBALTA) 60 MG capsule Take 1 capsule (60 mg total) by mouth at bedtime. 30 capsule 3  . HYDROcodone-acetaminophen (NORCO) 10-325 MG tablet Take 1 tablet by mouth every 8 (eight) hours as needed for moderate pain or severe pain. Supervising physician; Dr Eliezer Lofts; DEA# [id number] 90 tablet 0  . hydroquinone 4 % cream 1 APPLICATION TOPICALLY DAILY AS NEEDED FOR BLEMISHES.  3  . hydrOXYzine (VISTARIL) 50 MG capsule TAKE 1 CAPSLE BY MOUTH AT NOON, 1 AT 6 PM, AND 1 AS NEEDED 270 capsule 2  . lamoTRIgine (LAMICTAL) 150 MG tablet Take 2 tablets (300 mg total) by mouth at bedtime. 60 tablet 5  . levalbuterol (XOPENEX HFA) 45 MCG/ACT inhaler Inhale 1-2 puffs  into the lungs every 6 (six) hours as needed for wheezing. 1 Inhaler 0  . levalbuterol (XOPENEX) 1.25 MG/3ML nebulizer solution USE 1 VIAL VIA NEBULIZER EVERY 4 HOURS AS NEEDED FOR WHEEZING OR SHORTNESS OF BREATH 270 mL 1  . Lysine 1000 MG TABS Take 2,000-4,000 mg by mouth at bedtime. 2000 mg scheduled at bedtime  and patient will take 4000 mg if she has outbreak    . Magnesium 500 MG TABS Take 1 tablet by mouth at bedtime.    Marland Kitchen neomycin-bacitracin-polymyxin (NEOSPORIN) [phone] ointment Apply 1 application topically 4 (four) times daily as needed (for cut/scrapes.).    Marland Kitchen pantoprazole (PROTONIX) 40 MG tablet Take 1 tablet (40 mg total) by mouth daily. For heartburn. 90 tablet 1  . phenazopyridine (AZO-TABS) 95 MG tablet Take 95 mg by mouth 3 (three) times daily as needed for pain.    . polyethylene glycol powder (GLYCOLAX/MIRALAX) powder MIX 17 GRAMS (1 CAPFUL) WITH 4-8 OZ OF LIQUID AND TAKE BY MOUTH TWICE DAILY AS NEEDED 527 g 0  . temazepam (RESTORIL) 30 MG capsule TAKE ONE TO TWO CAPSULES BY MOUTH NIGHTLY AT BEDTIME 60 capsule 4  . tiZANidine (ZANAFLEX) 4 MG tablet TAKE 2 TABLETS (8 MG TOTAL) BY MOUTH EVERY 6 (SIX) HOURS AS NEEDED FOR MUSCLE SPASMS. 720 tablet 0  . traZODone (DESYREL) 50 MG tablet Take 2 tablets (100 mg total) by mouth at bedtime. 60 tablet 7  . triamcinolone cream (KENALOG) 0.1 % Apply 1 application topically 2 (two) times daily. 30 g 0   No current facility-administered medications for this visit.     Neurologic: Headache: No Seizure: No Paresthesias:No  Musculoskeletal: Strength & Muscle Tone: within normal limits Gait & Station: normal Patient leans: N/A  Psychiatric Specialty Exam: ROS  Last menstrual period 08/23/2014.There is no height or weight on file to calculate BMI.  General Appearance: Casual  Eye Contact:  Good  Speech:  Clear and Coherent  Volume:  Normal  Mood:  Negative  Affect:  Appropriate  Thought Process:  Goal Directed  Orientation:  NA  Thought Content:  Logical  Suicidal Thoughts:  No  Homicidal Thoughts:  No  Memory:  Negative  Judgement:  Good  Insight:  Good  Psychomotor Activity:  Normal  Concentration:    Recall:    Fund of Knowledge:Good  Language: Good  Akathisia:  No  Handed:  Right  AIMS (if indicated):    Assets:  Desire  for Improvement  ADL's:  Intact  Cognition: WNL  Sleep:      8/19/20201:43 PM    At this time the patient is #1 problem is bipolar disorder.  At this time she will continue taking a high dose of Lamictal 300 mg.  Today we will assume that she is in fact more depressed and we will go ahead and increase her Cymbalta from a dose of 40 mg to a higher dose of 60 mg.  Her second problem is that of insomnia.  She will continue taking Restoril 60 mg at night.  He no longer takes Ambien.  She is made attempts in the past to get a therapist but it is unaffordable for her.  I think the patient has shown some degree of decline.  I think she is still functioning.  She lives alone.  She does have 2 good friends that she talks to on a regular basis and her son calls her every day.Marland Kitchen

## 2019-07-06 MED ORDER — HYDROCODONE-ACETAMINOPHEN 10-325 MG PO TABS: 1.0000 | ORAL_TABLET | Freq: Three times a day (TID) | ORAL | 0 refills | Status: DC | PRN

## 2019-07-07 ENCOUNTER — Other Ambulatory Visit: Payer: Self-pay | Admitting: Primary Care

## 2019-07-07 DIAGNOSIS — J3089 Other allergic rhinitis: Secondary | ICD-10-CM

## 2019-07-10 DIAGNOSIS — H11153 Pinguecula, bilateral: Secondary | ICD-10-CM | POA: Diagnosis not present

## 2019-07-30 ENCOUNTER — Other Ambulatory Visit: Payer: Self-pay | Admitting: Primary Care

## 2019-07-30 ENCOUNTER — Other Ambulatory Visit (HOSPITAL_COMMUNITY): Payer: Self-pay | Admitting: Psychiatry

## 2019-07-30 DIAGNOSIS — J3089 Other allergic rhinitis: Secondary | ICD-10-CM

## 2019-08-01 ENCOUNTER — Ambulatory Visit: Payer: PPO | Admitting: Gastroenterology

## 2019-08-02 ENCOUNTER — Other Ambulatory Visit: Payer: Self-pay | Admitting: Primary Care

## 2019-08-02 DIAGNOSIS — E785 Hyperlipidemia, unspecified: Secondary | ICD-10-CM

## 2019-08-07 ENCOUNTER — Encounter: Payer: Self-pay | Admitting: Primary Care

## 2019-08-07 ENCOUNTER — Ambulatory Visit (INDEPENDENT_AMBULATORY_CARE_PROVIDER_SITE_OTHER): Payer: PPO | Admitting: Primary Care

## 2019-08-07 DIAGNOSIS — K219 Gastro-esophageal reflux disease without esophagitis: Secondary | ICD-10-CM | POA: Diagnosis not present

## 2019-08-07 DIAGNOSIS — M159 Polyosteoarthritis, unspecified: Secondary | ICD-10-CM

## 2019-08-07 DIAGNOSIS — G894 Chronic pain syndrome: Secondary | ICD-10-CM

## 2019-08-07 NOTE — Assessment & Plan Note (Signed)
Daily GERD symptoms on PRN pantoprazole. Discussed to start pantoprazole daily. She will update.

## 2019-08-07 NOTE — Progress Notes (Signed)
Subjective:    Patient ID: Emily Moon, female    DOB: 07-01-1961, 58 y.o.   MRN: [id number]  HPI  Virtual Visit via Video Note  I connected with Emily Moon on 08/07/19 at 10:40 AM EDT by a video enabled telemedicine application and verified that I am speaking with the correct person using two identifiers.  Location: Patient: Home Provider: Office   I discussed the limitations of evaluation and management by telemedicine and the availability of in person appointments. The patient expressed understanding and agreed to proceed.  History of Present Illness:  [name] is a 58 year old female with a history of multiple sclerosis, chronic pain syndrome, osteoarthritis, recurrent renal calculus, bipolar disorder, prediabetes, hyperlipidemia, OSA, GERD, asthma who presents today for follow up of chronic pain.  She is currently managed on hydrocodone-acetaminophen 10-325 mg for which she takes every 8 hours. She is also managed on duloxetine 60 mg which was recently increased per her psychiatrist. Also managed on Tizandine three times daily in between doses of hydrocodone-acetaminophen. She continues to experience breakthrough pain to her lower back, hips. She has continuously declined pain management referral despite continued pain.   She recently began swimming at the [facility] several days weekly which has helped to improve pain. She did notice increased myalgias with resuming atorvastatin, she will try taking this every other day.   Observations/Objective:  Alert and oriented. Appears well, not sickly. No distress. Speaking in complete sentences.   Assessment and Plan:  Discussed eventual goal to reduce frequency or dose of Norco, she is unable to do this today but she understands our goal. Agree to continued prescription of Norco for chronic pain, offered pain management for which she continues to decline. No suspicious activity noted on PMP aware.  Encouraged continued  swimming for exercise and joint mobility.  Follow up in three months.  Follow Up Instructions:  Try to work reducing your frequency or dose of the hydrocodone-acetaminophen. You can break the pills in half.   Continue swimming for your joints.   Take the atorvastatin every other day for cholesterol as discussed.   It was a pleasure to see you today! Allie Bossier, NP-C    I discussed the assessment and treatment plan with the patient. The patient was provided an opportunity to ask questions and all were answered. The patient agreed with the plan and demonstrated an understanding of the instructions.   The patient was advised to call back or seek an in-person evaluation if the symptoms worsen or if the condition fails to improve as anticipated.    [name], NP    Review of Systems  Respiratory: Negative for shortness of breath.   Cardiovascular: Negative for chest pain.  Gastrointestinal:       Daily GERD symptoms with PRN use of pantoprazole   Musculoskeletal: Positive for arthralgias, back pain and myalgias.       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   Hypercholesterolemia    Multiple sclerosis (HCC)    weakness   Nephrolithiasis    kidney stones   Osteoporosis    osteoarthritis   Pneumonia    PONV (postoperative nausea and vomiting)    no problem after cataract surgery   Psoriasis    Renal stone    Shortness of breath dyspnea    wheezing   Sleep apnea 2012   sleep study / slight, no interventions   Urinary  frequency      Social History   Socioeconomic History   Marital status: Divorced    Spouse name: Not on file   Number of children: 1   Years of education: Not on file   Highest education level: Not on file  Occupational History   Occupation: Therapist, 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[employer]: [place] resource strain: Not on file   Food insecurity    Worry: Not on file    Inability: Not on file   Transportation needs    Medical: Not on file    Non-medical: Not on file  Tobacco Use   Smoking status: Former Smoker    Years: 25.00    Types: Cigarettes    Quit date: 11/16/2012    Years since quitting: 6.7   Smokeless tobacco: Never Used   Tobacco comment: occasional use  Substance and Sexual Activity   Alcohol use: No    Alcohol/week: 0.0 standard drinks   Drug use: No    Types: Methylphenidate   Sexual activity: Never  Lifestyle   Physical activity    Days per week: Not on file    Minutes per session: Not on file   Stress: Not on file  Relationships   Social connections    Talks on phone: Not on file    Gets together: Not on file    Attends religious service: Not on file    Active member of club or organization: Not on file    Attends meetings of clubs or organizations: Not on file    Relationship status: Not on file   Intimate partner violence    Fear of current or ex partner: Not on file    Emotionally abused: Not on file    Physically abused: Not on file    Forced sexual activity: Not on file  Other Topics Concern   Not on file  Social History Narrative   Not on file    Past Surgical History:  Procedure Laterality Date   CATARACT EXTRACTION W/PHACO Left 05/21/2015   Procedure: CATARACT EXTRACTION PHACO AND INTRAOCULAR LENS PLACEMENT ([place]);  Surgeon: Birder Robson, MD;  Location: [facility];  Service: Ophthalmology;  Laterality: Left;  [place] 00:35 AP% 22.9 CDE 8.11 fluid pack lot [id number]   CATARACT EXTRACTION  W/PHACO Right 06/04/2015   Procedure: CATARACT EXTRACTION PHACO AND INTRAOCULAR LENS PLACEMENT ([facility]);  Surgeon: Birder Robson, MD;  Location: [facility];  Service: Ophthalmology;  Laterality: Right;  US:00:48 AP%: 10.5 CDE:5.08 Fluid lot [id number]   CYSTOSCOPY/URETEROSCOPY/HOLMIUM LASER/STENT PLACEMENT Bilateral 09/22/2016   Procedure: CYSTOSCOPY/URETEROSCOPY/HOLMIUM LASER/STENT PLACEMENT;  Surgeon: Hollice Espy, MD;  Location: [facility];  Service: Urology;  Laterality: Bilateral;   EYE SURGERY  2015   tissue biopsy   FOOT SURGERY  2015   JOINT REPLACEMENT Left 2013   hip replacement   LITHOTRIPSY     PTOSIS REPAIR Bilateral 02/18/2016   Procedure: BILATERAL PTOSIS REPAIR UPPER EYELIDS;  Surgeon: Warren Lacy  Dennie Maizes, MD;  Location: [facility];  Service: Ophthalmology;  Laterality: Bilateral;  LEAVE PT EARLY AM   thumb surgery Right    TONSILLECTOMY  1973    Family History  Problem Relation Age of Onset   Cancer Father        Abdomen with mastasis   Cancer Mother    Heart disease Mother    Kidney disease Neg Hx    Bladder Cancer Neg Hx    Prostate cancer Neg Hx    Kidney cancer Neg Hx     Allergies  Allergen Reactions   Albuterol Shortness Of Breath and Other (See Comments)    Makes pt feel jittery/ tacycardic   Crestor [Rosuvastatin] Other (See Comments)    Joint pain, muscle pain, and hair loss   Halcion [Triazolam] Other (See Comments)    Dizziness,headaches,bladder problems   Levaquin [Levofloxacin In D5w] Diarrhea and Itching    Shoulder pain   Naproxen Sodium Swelling    Patient tolerates in small doses   Tylenol [Acetaminophen] Swelling    Patient tolerates in small doses   Cefaclor Other (See Comments)    Doesn't remember---unsure if actually allergic    Diclofenac Sodium Other (See Comments)    "made very sick"   Sulfa Antibiotics Itching    Unsure of reaction possibly itching   Tramadol Itching and Nausea And Vomiting    Aripiprazole Other (See Comments)    Muscle tension/cramping   Ibuprofen Swelling    Patient tolerates in small doses    Current Outpatient Medications on File Prior to Visit  Medication Sig Dispense Refill   ASPERCREME LIDOCAINE EX Apply 1 application topically 4 (four) times daily as needed (for pain.).      aspirin 325 MG tablet Take 325 mg by mouth every 4 (four) hours as needed for headache.      Azelastine HCl 137 MCG/SPRAY SOLN PLACE 2 SPRAYS INTO BOTH NOSTRILS 2 (TWO) TIMES DAILY 30 mL 2   BIOTIN PO Take 200 mg by mouth daily.      calcipotriene-betamethasone (TACLONEX) ointment Apply 1 application topically daily as needed (for psorasis).      Cholecalciferol (VITAMIN D3) 10000 units capsule Take 30,000 Units by mouth daily.      clobetasol cream (TEMOVATE) [id number] % Apply 1 application topically 2 (two) times daily. 30 g 2   cyanocobalamin (,VITAMIN B-12,) 1000 MCG/ML injection Inject 1 mL (1,000 mcg total) into the muscle every 3 (three) months. 1 mL 0   diclofenac sodium (VOLTAREN) 1 % GEL Apply 4 g topically 4 (four) times daily. 500 g 5   docusate sodium (COLACE) 100 MG capsule Take 1 capsule (100 mg total) by mouth 2 (two) times daily. 60 capsule 0   DULoxetine (CYMBALTA) 60 MG capsule Take 1 capsule (60 mg total) by mouth at bedtime. 30 capsule 3   HYDROcodone-acetaminophen (NORCO) 10-325 MG tablet Take 1 tablet by mouth every 8 (eight) hours as needed for moderate pain or severe pain. Supervising physician; Dr Eliezer Lofts; DEA# [id number] 90 tablet 0   hydroquinone 4 % cream 1 APPLICATION TOPICALLY DAILY AS NEEDED FOR BLEMISHES.  3   hydrOXYzine (VISTARIL) 50 MG capsule TAKE 1 CAPSLE BY MOUTH AT NOON, 1 AT 6 PM, AND 1 AS NEEDED 270 capsule 2   lamoTRIgine (LAMICTAL) 150 MG tablet Take 2 tablets (300 mg total) by mouth at bedtime. 60 tablet 5   levalbuterol (XOPENEX HFA) 45 MCG/ACT inhaler Inhale 1-2 puffs into the lungs every  6 (six) hours as needed for wheezing.  1 Inhaler 0   levalbuterol (XOPENEX) 1.25 MG/3ML nebulizer solution USE 1 VIAL VIA NEBULIZER EVERY 4 HOURS AS NEEDED FOR WHEEZING OR SHORTNESS OF BREATH 270 mL 1   Lysine 1000 MG TABS Take 2,000-4,000 mg by mouth at bedtime. 2000 mg scheduled at bedtime and patient will take 4000 mg if she has outbreak     Magnesium 500 MG TABS Take 1 tablet by mouth at bedtime.     neomycin-bacitracin-polymyxin (NEOSPORIN) [phone] ointment Apply 1 application topically 4 (four) times daily as needed (for cut/scrapes.).     pantoprazole (PROTONIX) 40 MG tablet Take 1 tablet (40 mg total) by mouth daily. For heartburn. 90 tablet 1   phenazopyridine (AZO-TABS) 95 MG tablet Take 95 mg by mouth 3 (three) times daily as needed for pain.     polyethylene glycol powder (GLYCOLAX/MIRALAX) powder MIX 17 GRAMS (1 CAPFUL) WITH 4-8 OZ OF LIQUID AND TAKE BY MOUTH TWICE DAILY AS NEEDED 527 g 0   temazepam (RESTORIL) 30 MG capsule TAKE ONE TO TWO CAPSULES BY MOUTH NIGHTLY AT BEDTIME 60 capsule 4   tiZANidine (ZANAFLEX) 4 MG tablet TAKE 2 TABLETS (8 MG TOTAL) BY MOUTH EVERY 6 (SIX) HOURS AS NEEDED FOR MUSCLE SPASMS. 720 tablet 0   traZODone (DESYREL) 50 MG tablet Take 2 tablets (100 mg total) by mouth at bedtime. 60 tablet 7   triamcinolone cream (KENALOG) 0.1 % Apply 1 application topically 2 (two) times daily. 30 g 0   atorvastatin (LIPITOR) 20 MG tablet TAKE 1 TABLET BY MOUTH EVERY DAY (Patient not taking: Reported on 08/07/2019) 90 tablet 0   No current facility-administered medications on file prior to visit.     Wt 212 lb (96.2 kg)    LMP 08/23/2014    BMI 31.31 kg/m    Objective:   Physical Exam  Constitutional: She is oriented to person, place, and time. She appears well-nourished.  Respiratory: Effort normal.  Neurological: She is alert and oriented to person, place, and time.  Psychiatric: She has a normal mood and affect.           Assessment & Plan:

## 2019-08-07 NOTE — Assessment & Plan Note (Signed)
Discussed eventual goal to reduce frequency or dose of Norco, she is unable to do this today but she understands our goal. Agree to continued prescription of Norco for chronic pain, offered pain management for which she continues to decline. No suspicious activity noted on PMP aware.  Encouraged continued swimming for exercise and joint mobility.  Follow up in three months.

## 2019-08-07 NOTE — Patient Instructions (Signed)
Try to work reducing your frequency or dose of the hydrocodone-acetaminophen. You can break the pills in half.   Continue swimming for your joints.   Take the atorvastatin every other day for cholesterol as discussed.   It was a pleasure to see you today! Allie Bossier, NP-C

## 2019-08-08 ENCOUNTER — Encounter: Payer: Self-pay | Admitting: Family Medicine

## 2019-08-08 DIAGNOSIS — M1990 Unspecified osteoarthritis, unspecified site: Secondary | ICD-10-CM

## 2019-08-09 MED ORDER — HYDROCODONE-ACETAMINOPHEN 10-325 MG PO TABS: 1.0000 | ORAL_TABLET | Freq: Three times a day (TID) | ORAL | 0 refills | Status: DC | PRN

## 2019-08-10 ENCOUNTER — Ambulatory Visit: Payer: PPO | Admitting: Internal Medicine

## 2019-08-11 ENCOUNTER — Ambulatory Visit: Payer: PPO | Admitting: Primary Care

## 2019-08-11 ENCOUNTER — Ambulatory Visit (INDEPENDENT_AMBULATORY_CARE_PROVIDER_SITE_OTHER): Payer: PPO | Admitting: Internal Medicine

## 2019-08-11 ENCOUNTER — Encounter: Payer: Self-pay | Admitting: Internal Medicine

## 2019-08-11 VITALS — HR 108 | Temp 98.7°F | Wt 224.0 lb

## 2019-08-11 DIAGNOSIS — J029 Acute pharyngitis, unspecified: Secondary | ICD-10-CM

## 2019-08-11 DIAGNOSIS — R059 Cough, unspecified: Secondary | ICD-10-CM

## 2019-08-11 DIAGNOSIS — R0602 Shortness of breath: Secondary | ICD-10-CM | POA: Diagnosis not present

## 2019-08-11 DIAGNOSIS — Z9109 Other allergy status, other than to drugs and biological substances: Secondary | ICD-10-CM

## 2019-08-11 DIAGNOSIS — R05 Cough: Secondary | ICD-10-CM | POA: Diagnosis not present

## 2019-08-11 DIAGNOSIS — R0982 Postnasal drip: Secondary | ICD-10-CM

## 2019-08-11 NOTE — Patient Instructions (Signed)
Allergies, Adult An allergy means that your body reacts to something that bothers it (allergen). It is not a normal reaction. This can happen from something that you:  Eat.  Breathe in.  Touch. You can have an allergy (be allergic) to:  Outdoor things, like: ? Pollen. ? Grass. ? Weeds.  Indoor things, like: ? Dust. ? Smoke. ? Pet dander.  Foods.  Medicines.  Things that bother your skin, like: ? Detergents. ? Chemicals. ? Latex.  Perfume.  Bugs. An allergy cannot spread from person to person (is not contagious). Follow these instructions at home:         Stay away from things that you know you are allergic to.  If you have allergies to things in the air, wash out your nose each day. Do it with one of these: ? A salt-water (saline) spray. ? A container (neti pot).  Take over-the-counter and prescription medicines only as told by your doctor.  Keep all follow-up visits as told by your doctor. This is important.  If you are at risk for a very bad allergy reaction (anaphylaxis), keep an auto-injector with you all the time. This is called an epinephrine injection. ? This is pre-measured medicine with a needle. You can put it into your skin by yourself. ? Right after you have a very bad allergy reaction, you or a person with you must give the medicine in less than a few minutes. This is an emergency.  If you have ever had a very bad allergy reaction, wear a medical alert bracelet or necklace. Your very bad allergy should be written on it. Contact a health care provider if:  Your symptoms do not get better with treatment. Get help right away if:  You have symptoms of a very bad allergy reaction. These include: ? A swollen mouth, tongue, or throat. ? Pain or tightness in your chest. ? Trouble breathing. ? Being short of breath. ? Dizziness. ? Fainting. ? Very bad pain in your belly (abdomen). ? Throwing up (vomiting). ? Watery poop (diarrhea). Summary   An allergy means that your body reacts to something that bothers it (allergen). It is not a normal reaction.  Stay away from things that make your body react.  Take over-the-counter and prescription medicines only as told by your doctor.  If you are at risk for a very bad allergy reaction, carry an auto-injector (epinephrine injection) all the time. Also, wear a medical alert bracelet or necklace so people know about your allergy. This information is not intended to replace advice given to you by your health care provider. Make sure you discuss any questions you have with your health care provider. Document Released: 02/27/2013 Document Revised: 02/21/2019 Document Reviewed: 02/15/2017 Elsevier Patient Education  2020 [employer]  

## 2019-08-11 NOTE — Progress Notes (Signed)
Virtual Visit via Video Note  I connected with Emily Moon on 08/11/19 at  2:00 PM EDT by a video enabled telemedicine application and verified that I am speaking with the correct person using two identifiers.  Location: Patient: Home Provider: Office   I discussed the limitations of evaluation and management by telemedicine and the availability of in person appointments. The patient expressed understanding and agreed to proceed.  History of Present Illness:  Pt reports sore throat, post nasal drip and cough. She reports the cough is not really productive but when it is, it is productive of yellow/brown blood tinged mucous. She has some ear fullness but denies ear pain or drainage. She denies runny nose, nasal congestion, difficulty with swallowing. She is mildly short of breath but attributes this to weight gain. She reports history of chronic intermittent fevers, but denies chills or body aches. She is taking Xopenex, Astelin and Zyrtec daily with some relief. She has not had sick contacts that she is aware of.   Past Medical History:  Diagnosis Date  . Arthritis    osteo  . Asthma   . Bipolar disorder ([place]) 05/21/14  . Cataracts, bilateral   . DDD (degenerative disc disease), cervical    also back  . Depression   . Dizziness    Positional  . Edema    feet/legs  . Fibromyalgia syndrome   . Fungal infection    Finger nails  . GERD (gastroesophageal reflux disease)   . Gout   . Headache    seasonal allergies  . Heart palpitations   . 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 Hypercholesterolemia   . Multiple sclerosis (HCC)    weakness  . Nephrolithiasis    kidney stones  . Osteoporosis    osteoarthritis  . Pneumonia   . PONV (postoperative nausea and vomiting)    no problem after cataract surgery  . Psoriasis   . Renal stone   . Shortness of breath dyspnea    wheezing  . Sleep apnea 2012   sleep study / slight, no interventions  . Urinary frequency      Current Outpatient Medications  Medication Sig Dispense Refill  . ASPERCREME LIDOCAINE EX Apply 1 application topically 4 (four) times daily as needed (for pain.).     Marland Kitchen aspirin 325 MG tablet Take 325 mg by mouth every 4 (four) hours as needed for headache.     Marland Kitchen atorvastatin (LIPITOR) 20 MG tablet TAKE 1 TABLET BY MOUTH EVERY DAY (Patient not taking: Reported on 08/07/2019) 90 tablet 0  . Azelastine HCl 137 MCG/SPRAY SOLN PLACE 2 SPRAYS INTO BOTH NOSTRILS 2 (TWO) TIMES DAILY 30 mL 2  . BIOTIN PO Take 200 mg by mouth daily.     . calcipotriene-betamethasone (TACLONEX) ointment Apply 1 application topically daily as needed (for psorasis).     . Cholecalciferol (VITAMIN D3) 10000 units capsule Take 30,000 Units by mouth daily.     . clobetasol cream (TEMOVATE) [id number] % Apply 1 application topically 2 (two) times daily. 30 g 2  . cyanocobalamin (,VITAMIN B-12,) 1000 MCG/ML injection Inject 1 mL (1,000 mcg total) into the muscle every 3 (three) months. 1 mL 0  . diclofenac sodium (VOLTAREN) 1 % GEL Apply 4 g topically 4 (four) times daily. 500 g 5  . docusate sodium (COLACE) 100 MG capsule Take 1 capsule (100 mg total) by mouth 2 (two) times daily. 60 capsule 0  . DULoxetine (CYMBALTA) 60 MG capsule Take 1 capsule (60  mg total) by mouth at bedtime. 30 capsule 3  . HYDROcodone-acetaminophen (NORCO) 10-325 MG tablet Take 1 tablet by mouth every 8 (eight) hours as needed for moderate pain or severe pain. Supervising physician; Dr Eliezer Lofts; DEA# [id number] 90 tablet 0  . hydroquinone 4 % cream 1 APPLICATION TOPICALLY DAILY AS NEEDED FOR BLEMISHES.  3  . hydrOXYzine (VISTARIL) 50 MG capsule TAKE 1 CAPSLE BY MOUTH AT NOON, 1 AT 6 PM, AND 1 AS NEEDED 270 capsule 2  . lamoTRIgine (LAMICTAL) 150 MG tablet Take 2 tablets (300 mg total) by mouth at bedtime. 60 tablet 5  . levalbuterol (XOPENEX HFA) 45 MCG/ACT inhaler Inhale 1-2 puffs into the lungs every 6 (six) hours as needed for wheezing. 1 Inhaler 0  .  levalbuterol (XOPENEX) 1.25 MG/3ML nebulizer solution USE 1 VIAL VIA NEBULIZER EVERY 4 HOURS AS NEEDED FOR WHEEZING OR SHORTNESS OF BREATH 270 mL 1  . Lysine 1000 MG TABS Take 2,000-4,000 mg by mouth at bedtime. 2000 mg scheduled at bedtime and patient will take 4000 mg if she has outbreak    . Magnesium 500 MG TABS Take 1 tablet by mouth at bedtime.    Marland Kitchen neomycin-bacitracin-polymyxin (NEOSPORIN) [phone] ointment Apply 1 application topically 4 (four) times daily as needed (for cut/scrapes.).    Marland Kitchen pantoprazole (PROTONIX) 40 MG tablet Take 1 tablet (40 mg total) by mouth daily. For heartburn. 90 tablet 1  . phenazopyridine (AZO-TABS) 95 MG tablet Take 95 mg by mouth 3 (three) times daily as needed for pain.    . polyethylene glycol powder (GLYCOLAX/MIRALAX) powder MIX 17 GRAMS (1 CAPFUL) WITH 4-8 OZ OF LIQUID AND TAKE BY MOUTH TWICE DAILY AS NEEDED 527 g 0  . temazepam (RESTORIL) 30 MG capsule TAKE ONE TO TWO CAPSULES BY MOUTH NIGHTLY AT BEDTIME 60 capsule 4  . tiZANidine (ZANAFLEX) 4 MG tablet TAKE 2 TABLETS (8 MG TOTAL) BY MOUTH EVERY 6 (SIX) HOURS AS NEEDED FOR MUSCLE SPASMS. 720 tablet 0  . traZODone (DESYREL) 50 MG tablet Take 2 tablets (100 mg total) by mouth at bedtime. 60 tablet 7  . triamcinolone cream (KENALOG) 0.1 % Apply 1 application topically 2 (two) times daily. 30 g 0   No current facility-administered medications for this visit.     Allergies  Allergen Reactions  . Albuterol Shortness Of Breath and Other (See Comments)    Makes pt feel jittery/ tacycardic  . Crestor [Rosuvastatin] Other (See Comments)    Joint pain, muscle pain, and hair loss  . Halcion [Triazolam] Other (See Comments)    Dizziness,headaches,bladder problems  . Levaquin [Levofloxacin In D5w] Diarrhea and Itching    Shoulder pain  . Naproxen Sodium Swelling    Patient tolerates in small doses  . Tylenol [Acetaminophen] Swelling    Patient tolerates in small doses  . Cefaclor Other (See Comments)     Doesn't remember---unsure if actually allergic   . Diclofenac Sodium Other (See Comments)    "made very sick"  . Sulfa Antibiotics Itching    Unsure of reaction possibly itching  . Tramadol Itching and Nausea And Vomiting  . Aripiprazole Other (See Comments)    Muscle tension/cramping  . Ibuprofen Swelling    Patient tolerates in small doses    Family History  Problem Relation Age of Onset  . Cancer Father        Abdomen with mastasis  . Cancer Mother   . Heart disease Mother   . Kidney disease Neg Hx   .  Bladder Cancer Neg Hx   . Prostate cancer Neg Hx   . Kidney cancer Neg Hx     Social History   Socioeconomic History  . Marital status: Divorced    Spouse name: Not on file  . Number of children: 1  . Years of education: Not on file  . Highest education level: Not on file  Occupational History  . 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 Financial resource strain: Not on file  . Food insecurity    Worry: Not on file    Inability: Not on file  . Transportation needs    Medical: Not on file    Non-medical: Not on file  Tobacco Use  . Smoking status: Former Smoker    Years: 25.00    Types: Cigarettes    Quit date: 11/16/2012    Years since quitting: 6.7  . Smokeless tobacco: Never Used  . Tobacco comment: occasional use  Substance and Sexual Activity  . Alcohol use: No    Alcohol/week: 0.0 standard drinks  . Drug use: No    Types: Methylphenidate  . Sexual activity: Never  Lifestyle  . Physical activity    Days per week: Not on file    Minutes per session: Not on file  . Stress: Not on file  Relationships  . Social Herbalist on phone: Not on file    Gets together: Not on file    Attends religious service: Not on file    Active member of club or organization: Not on file    Attends meetings of clubs or organizations: Not on file    Relationship status: Not on file  . Intimate partner violence    Fear  of current or ex partner: Not on file    Emotionally abused: Not on file    Physically abused: Not on file    Forced sexual activity: Not on file  Other Topics Concern  . Not on file  Social History Narrative  . Not on file     Constitutional: Denies fever, malaise, fatigue, headache or abrupt weight changes.  HEENT: Denies eye pain, eye redness, ear pain, ringing in the ears, wax buildup, runny nose, nasal congestion, bloody nose, or sore throat. Respiratory: Pt reports cough, mild shortness of breath. Denies difficulty breathing, or sputum production.   Cardiovascular: Denies chest pain, chest tightness, palpitations or swelling in the hands or feet.   No other specific complaints in a complete review of systems (except as listed in HPI above).  Observations/Objective:   Wt Readings from Last 3 Encounters:  08/07/19 212 lb (96.2 kg)  06/12/19 212 lb 8 oz (96.4 kg)  03/09/19 192 lb 8 oz (87.3 kg)    General: Appears her stated age, well developed, well nourished in NAD. Pulmonary/Chest: Normal effort. No respiratory distress. Neurological: Alert and oriented.  BMET    Component Value Date/Time   NA 138 05/10/2019 1434   K 4.6 05/10/2019 1434   CL 102 05/10/2019 1434   CO2 24 05/10/2019 1434   GLUCOSE 119 (H) 05/10/2019 1434   BUN 20 05/10/2019 1434   CREATININE 0.73 05/10/2019 1434   CALCIUM 9.2 05/10/2019 1434   GFRNONAA >60 10/13/2016 0446   GFRAA >60 10/13/2016 0446    Lipid Panel     Component Value Date/Time   CHOL 353 (H) 05/10/2019 1434   TRIG 210.0 (H) 05/10/2019 1434   HDL 61.20 05/10/2019  1434   CHOLHDL 6 05/10/2019 1434   VLDL 42.0 (H) 05/10/2019 1434   LDLCALC 223 (H) 11/13/2014 0908    CBC    Component Value Date/Time   WBC 8.2 10/24/2018 1123   RBC 4.13 10/24/2018 1123   HGB 13.5 10/24/2018 1123   HCT 39.8 10/24/2018 1123   PLT 340.0 10/24/2018 1123   MCV 96.2 10/24/2018 1123   MCH 31.7 10/14/2016 0444   MCHC 34.1 10/24/2018 1123    RDW 13.7 10/24/2018 1123   LYMPHSABS 2.1 07/27/2017 1148   MONOABS 0.5 07/27/2017 1148   EOSABS 0.3 07/27/2017 1148   BASOSABS 0.1 07/27/2017 1148    Hgb A1C Lab Results  Component Value Date   HGBA1C 5.8 05/10/2019       Assessment and Plan:  Sore Throat, PND, Cough, SOB:  Likely allergy related Does not sound like COVID Continue Zyrtec, Astelin and Xopenex Encouraged salt water gargles No indication for abx or steroids at this time  Return precautions discussed  Follow Up Instructions:    I discussed the assessment and treatment plan with the patient. The patient was provided an opportunity to ask questions and all were answered. The patient agreed with the plan and demonstrated an understanding of the instructions.   The patient was advised to call back or seek an in-person evaluation if the symptoms worsen or if the condition fails to improve as anticipated.     Webb Silversmith, NP

## 2019-08-15 ENCOUNTER — Encounter: Payer: Self-pay | Admitting: Primary Care

## 2019-08-15 ENCOUNTER — Other Ambulatory Visit: Payer: Self-pay

## 2019-08-15 ENCOUNTER — Ambulatory Visit (INDEPENDENT_AMBULATORY_CARE_PROVIDER_SITE_OTHER): Payer: PPO | Admitting: Primary Care

## 2019-08-15 VITALS — BP 106/72 | HR 86 | Temp 95.6°F | Ht 69.0 in | Wt 225.0 lb

## 2019-08-15 DIAGNOSIS — G35 Multiple sclerosis: Secondary | ICD-10-CM | POA: Diagnosis not present

## 2019-08-15 DIAGNOSIS — R635 Abnormal weight gain: Secondary | ICD-10-CM | POA: Diagnosis not present

## 2019-08-15 DIAGNOSIS — G35D Multiple sclerosis, unspecified: Secondary | ICD-10-CM

## 2019-08-15 DIAGNOSIS — E785 Hyperlipidemia, unspecified: Secondary | ICD-10-CM

## 2019-08-15 DIAGNOSIS — G894 Chronic pain syndrome: Secondary | ICD-10-CM | POA: Diagnosis not present

## 2019-08-15 DIAGNOSIS — E538 Deficiency of other specified B group vitamins: Secondary | ICD-10-CM

## 2019-08-15 DIAGNOSIS — E559 Vitamin D deficiency, unspecified: Secondary | ICD-10-CM | POA: Diagnosis not present

## 2019-08-15 DIAGNOSIS — M159 Polyosteoarthritis, unspecified: Secondary | ICD-10-CM

## 2019-08-15 DIAGNOSIS — R7303 Prediabetes: Secondary | ICD-10-CM

## 2019-08-15 LAB — CBC
HCT: 33.4 % — ABNORMAL LOW (ref 36.0–46.0)
Hemoglobin: 11.2 g/dL — ABNORMAL LOW (ref 12.0–15.0)
MCHC: 33.6 g/dL (ref 30.0–36.0)
MCV: 95.4 fl (ref 78.0–100.0)
Platelets: 272 10*3/uL (ref 150.0–400.0)
RBC: 3.5 Mil/uL — ABNORMAL LOW (ref 3.87–5.11)
RDW: 13.2 % (ref 11.5–15.5)
WBC: 6.3 10*3/uL (ref 4.0–10.5)

## 2019-08-15 LAB — HEMOGLOBIN A1C: Hgb A1c MFr Bld: 5.9 % (ref 4.6–6.5)

## 2019-08-15 LAB — VITAMIN B12: Vitamin B-12: 214 pg/mL (ref 211–911)

## 2019-08-15 LAB — VITAMIN D 25 HYDROXY (VIT D DEFICIENCY, FRACTURES): VITD: 61.68 ng/mL (ref 30.00–100.00)

## 2019-08-15 LAB — BRAIN NATRIURETIC PEPTIDE: Pro B Natriuretic peptide (BNP): 107 pg/mL — ABNORMAL HIGH (ref 0.0–100.0)

## 2019-08-15 NOTE — Assessment & Plan Note (Signed)
Agreeable to pain management as she has continuous pain with prescribed narcotic and muscle relaxer. Referral placed.

## 2019-08-15 NOTE — Progress Notes (Signed)
Subjective:    Patient ID: Emily Moon, female    DOB: 1961-03-30, 58 y.o.   MRN: [id number]  HPI  Emily Moon is a 58 year old female with a history of osteoarthritis, multiple sclerosis, chronic pain syndrome, bipolar disorder, hyperlipidemia, prediabetes, tobacco abuse who presents today with multiple complaints.  Weight gain of 35 pounds since January 2020 from office visit weights. She endorses increased exertional shortness of breath. Denies lower extremity edema, cough. She has been more sedentary since Covid-19.  Diet currently consists of:  Breakfast: Skips, sometimes asleep Lunch: Skips sometimes. Fast food (healthier choices), frozen dinners, protein shake Dinner: Frozen dinner, protein shake, fast food Snacks: Chips and salsa with sour cream, yogurt, cottage cheese Desserts: Daily  Beverages: Diet soda, water, coffee recently   Exercise: She was doing some exercises in the pool at the [facility] several days weekly at times, not recently.    Wt Readings from Last 3 Encounters:  08/15/19 225 lb (102.1 kg)  08/11/19 224 lb (101.6 kg)  08/07/19 212 lb (96.2 kg)   Increased back spasms, bilateral posterior extremity pain over the last several months. She is using her cane more frequently due to pain. She is taking her Norco every 8 hours for the most part, over the last three days has increased to every 6 hours. Also using her Tizanidine every 6 hours. She is more open to pain management now.   She has increased weakness to her bilateral lower extremities, also feels more off balance. Denies recent falls but has had a few "close calls". She is using her cane, has ordered a walker. She is to be getting a repeat MRI of her brain per neurology for MS, she has not yet scheduled.     Review of Systems  Constitutional:       Weight gain  Musculoskeletal: Positive for arthralgias and myalgias.       Altered gait.       Past Medical History:  Diagnosis Date  . Arthritis     osteo  . Asthma   . Bipolar disorder ([place]) 05/21/14  . Cataracts, bilateral   . DDD (degenerative disc disease), cervical    also back  . Depression   . Dizziness    Positional  . Edema    feet/legs  . Fibromyalgia syndrome   . Fungal infection    Finger nails  . GERD (gastroesophageal reflux disease)   . Gout   . Headache    seasonal allergies  . Heart palpitations   . 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 Hypercholesterolemia   . Multiple sclerosis (HCC)    weakness  . Nephrolithiasis    kidney stones  . Osteoporosis    osteoarthritis  . Pneumonia   . PONV (postoperative nausea and vomiting)    no problem after cataract surgery  . Psoriasis   . Renal stone   . Shortness of breath dyspnea    wheezing  . Sleep apnea 2012   sleep study / slight, no interventions  . Urinary frequency      Social History   Socioeconomic History  . Marital status: Divorced    Spouse name: Not on file  . Number of children: 1  . Years of education: Not on file  . Highest education level: Not on file  Occupational History  . 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 Financial resource strain: Not on file  . Food  insecurity    Worry: Not on file    Inability: Not on file  . Transportation needs    Medical: Not on file    Non-medical: Not on file  Tobacco Use  . Smoking status: Former Smoker    Years: 25.00    Types: Cigarettes    Quit date: 11/16/2012    Years since quitting: 6.7  . Smokeless tobacco: Never Used  . Tobacco comment: occasional use  Substance and Sexual Activity  . Alcohol use: No    Alcohol/week: 0.0 standard drinks  . Drug use: No    Types: Methylphenidate  . Sexual activity: Never  Lifestyle  . Physical activity    Days per week: Not on file    Minutes per session: Not on file  . Stress: Not on file  Relationships  . Social Herbalist on phone: Not on file    Gets together: Not on file     Attends religious service: Not on file    Active member of club or organization: Not on file    Attends meetings of clubs or organizations: Not on file    Relationship status: Not on file  . Intimate partner violence    Fear of current or ex partner: Not on file    Emotionally abused: Not on file    Physically abused: Not on file    Forced sexual activity: Not on file  Other Topics Concern  . Not on file  Social History Narrative  . Not on file    Past Surgical History:  Procedure Laterality Date  . CATARACT EXTRACTION W/PHACO Left 05/21/2015   Procedure: CATARACT EXTRACTION PHACO AND INTRAOCULAR LENS PLACEMENT (IOC);  Surgeon: Birder Robson, MD;  Location: [facility]S;  Service: Ophthalmology;  Laterality: Left;  [place] 00:35 AP% 22.9 CDE 8.11 fluid pack lot [id number]  . CATARACT EXTRACTION W/PHACO Right 06/04/2015   Procedure: CATARACT EXTRACTION PHACO AND INTRAOCULAR LENS PLACEMENT (IOC);  Surgeon: Birder Robson, MD;  Location: [facility];  Service: Ophthalmology;  Laterality: Right;  US:00:48 AP%: 10.5 CDE:5.08 Fluid lot [id number]  . CYSTOSCOPY/URETEROSCOPY/HOLMIUM LASER/STENT PLACEMENT Bilateral 09/22/2016   Procedure: CYSTOSCOPY/URETEROSCOPY/HOLMIUM LASER/STENT PLACEMENT;  Surgeon: Hollice Espy, MD;  Location: [facility];  Service: Urology;  Laterality: Bilateral;  . EYE SURGERY  2015   tissue biopsy  . FOOT SURGERY  2015  . JOINT REPLACEMENT Left 2013   hip replacement  . LITHOTRIPSY    . PTOSIS REPAIR Bilateral 02/18/2016   Procedure: BILATERAL PTOSIS REPAIR UPPER EYELIDS;  Surgeon: Karle Starch, MD;  Location: [place];  Service: Ophthalmology;  Laterality: Bilateral;  LEAVE PT EARLY AM  . thumb surgery Right   . TONSILLECTOMY  1973    Family History  Problem Relation Age of Onset  . Cancer Father        Abdomen with mastasis  . Cancer Mother   . Heart disease Mother   . Kidney disease Neg Hx   . Bladder Cancer Neg Hx   . Prostate cancer Neg Hx   . Kidney  cancer Neg Hx     Allergies  Allergen Reactions  . Albuterol Shortness Of Breath and Other (See Comments)    Makes pt feel jittery/ tacycardic  . Crestor [Rosuvastatin] Other (See Comments)    Joint pain, muscle pain, and hair loss  . Halcion [Triazolam] Other (See Comments)    Dizziness,headaches,bladder problems  . Levaquin [Levofloxacin In D5w] Diarrhea and Itching    Shoulder pain  .  Naproxen Sodium Swelling    Patient tolerates in small doses  . Tylenol [Acetaminophen] Swelling    Patient tolerates in small doses  . Cefaclor Other (See Comments)    Doesn't remember---unsure if actually allergic   . Diclofenac Sodium Other (See Comments)    "made very sick"  . Sulfa Antibiotics Itching    Unsure of reaction possibly itching  . Tramadol Itching and Nausea And Vomiting  . Aripiprazole Other (See Comments)    Muscle tension/cramping  . Ibuprofen Swelling    Patient tolerates in small doses    Current Outpatient Medications on File Prior to Visit  Medication Sig Dispense Refill  . ASPERCREME LIDOCAINE EX Apply 1 application topically 4 (four) times daily as needed (for pain.).     Marland Kitchen aspirin 325 MG tablet Take 325 mg by mouth every 4 (four) hours as needed for headache.     Marland Kitchen atorvastatin (LIPITOR) 20 MG tablet TAKE 1 TABLET BY MOUTH EVERY DAY 90 tablet 0  . Azelastine HCl 137 MCG/SPRAY SOLN PLACE 2 SPRAYS INTO BOTH NOSTRILS 2 (TWO) TIMES DAILY 30 mL 2  . BIOTIN PO Take 200 mg by mouth daily.     . calcipotriene-betamethasone (TACLONEX) ointment Apply 1 application topically daily as needed (for psorasis).     . Cholecalciferol (VITAMIN D3) 10000 units capsule Take 30,000 Units by mouth daily.     . clobetasol cream (TEMOVATE) [id number] % Apply 1 application topically 2 (two) times daily. 30 g 2  . cyanocobalamin (,VITAMIN B-12,) 1000 MCG/ML injection Inject 1 mL (1,000 mcg total) into the muscle every 3 (three) months. 1 mL 0  . diclofenac sodium (VOLTAREN) 1 % GEL Apply 4 g  topically 4 (four) times daily. 500 g 5  . docusate sodium (COLACE) 100 MG capsule Take 1 capsule (100 mg total) by mouth 2 (two) times daily. 60 capsule 0  . DULoxetine (CYMBALTA) 60 MG capsule Take 1 capsule (60 mg total) by mouth at bedtime. 30 capsule 3  . HYDROcodone-acetaminophen (NORCO) 10-325 MG tablet Take 1 tablet by mouth every 8 (eight) hours as needed for moderate pain or severe pain. Supervising physician; Dr Eliezer Lofts; DEA# [id number] 90 tablet 0  . hydroquinone 4 % cream 1 APPLICATION TOPICALLY DAILY AS NEEDED FOR BLEMISHES.  3  . hydrOXYzine (VISTARIL) 50 MG capsule TAKE 1 CAPSLE BY MOUTH AT NOON, 1 AT 6 PM, AND 1 AS NEEDED 270 capsule 2  . lamoTRIgine (LAMICTAL) 150 MG tablet Take 2 tablets (300 mg total) by mouth at bedtime. 60 tablet 5  . levalbuterol (XOPENEX HFA) 45 MCG/ACT inhaler Inhale 1-2 puffs into the lungs every 6 (six) hours as needed for wheezing. 1 Inhaler 0  . levalbuterol (XOPENEX) 1.25 MG/3ML nebulizer solution USE 1 VIAL VIA NEBULIZER EVERY 4 HOURS AS NEEDED FOR WHEEZING OR SHORTNESS OF BREATH 270 mL 1  . Lysine 1000 MG TABS Take 2,000-4,000 mg by mouth at bedtime. 2000 mg scheduled at bedtime and patient will take 4000 mg if she has outbreak    . Magnesium 500 MG TABS Take 1 tablet by mouth at bedtime.    Marland Kitchen neomycin-bacitracin-polymyxin (NEOSPORIN) [phone] ointment Apply 1 application topically 4 (four) times daily as needed (for cut/scrapes.).    Marland Kitchen pantoprazole (PROTONIX) 40 MG tablet Take 1 tablet (40 mg total) by mouth daily. For heartburn. 90 tablet 1  . phenazopyridine (AZO-TABS) 95 MG tablet Take 95 mg by mouth 3 (three) times daily as needed for pain.    Marland Kitchen  polyethylene glycol powder (GLYCOLAX/MIRALAX) powder MIX 17 GRAMS (1 CAPFUL) WITH 4-8 OZ OF LIQUID AND TAKE BY MOUTH TWICE DAILY AS NEEDED 527 g 0  . temazepam (RESTORIL) 30 MG capsule TAKE ONE TO TWO CAPSULES BY MOUTH NIGHTLY AT BEDTIME 60 capsule 4  . tiZANidine (ZANAFLEX) 4 MG tablet TAKE 2 TABLETS  (8 MG TOTAL) BY MOUTH EVERY 6 (SIX) HOURS AS NEEDED FOR MUSCLE SPASMS. 720 tablet 0  . traZODone (DESYREL) 50 MG tablet Take 2 tablets (100 mg total) by mouth at bedtime. 60 tablet 7  . triamcinolone cream (KENALOG) 0.1 % Apply 1 application topically 2 (two) times daily. 30 g 0   No current facility-administered medications on file prior to visit.     BP 106/72   Pulse 86   Temp (!) 95.6 F (35.3 C) (Temporal)   Ht 5\' 9"  (1.753 m)   Wt 225 lb (102.1 kg)   LMP 08/23/2014   SpO2 97%   BMI 33.23 kg/m    Objective:   Physical Exam  Constitutional: She appears well-nourished.  Neck: Neck supple.  Cardiovascular: Normal rate and regular rhythm.  Respiratory: Effort normal and breath sounds normal. She has no wheezes.  Musculoskeletal:     Comments: Appears stiffer today, using cane and ambulating decently   Skin: Skin is warm and dry.  Psychiatric: She has a normal mood and affect.           Assessment & Plan:

## 2019-08-15 NOTE — Assessment & Plan Note (Signed)
LDL significantly above goal on last check, she has again stopped statin therapy due to increased pain. She refuses all statin therapy, also refuses Zetia.

## 2019-08-15 NOTE — Patient Instructions (Signed)
Stop by the lab prior to leaving today. I will notify you of your results once received.   You will be contacted regarding your referral to pain management.  Please let us know if you have not been contacted within one week.   Please contact your neurologist for the MRI as discussed.  It was a pleasure to see you today!

## 2019-08-15 NOTE — Assessment & Plan Note (Signed)
Due for repeat MRI of the brain, patient will call her neurologist to schedule.  Do suspect bilateral lower extremity weakness with spasms could be secondary to relapsing MS.

## 2019-08-15 NOTE — Assessment & Plan Note (Signed)
Based off of HPI and exam today her weight gain is likely secondary to obesity from excess calories/unhelathy diet/lack of exercise rather than other cause.  Check BNP today. Advised she cut back on junk food, fast food. Start exercising.

## 2019-08-15 NOTE — Assessment & Plan Note (Signed)
Repeat A1C pending.  Discussed the importance of a healthy diet and regular exercise in order for weight loss, and to reduce the risk of any potential medical problems.  

## 2019-08-24 ENCOUNTER — Other Ambulatory Visit: Payer: Self-pay

## 2019-08-24 ENCOUNTER — Encounter: Payer: Self-pay | Admitting: Family Medicine

## 2019-08-24 ENCOUNTER — Ambulatory Visit (INDEPENDENT_AMBULATORY_CARE_PROVIDER_SITE_OTHER): Payer: PPO | Admitting: Family Medicine

## 2019-08-24 VITALS — BP 118/78 | HR 96 | Temp 97.8°F | Ht 69.0 in | Wt 215.5 lb

## 2019-08-24 DIAGNOSIS — M7061 Trochanteric bursitis, right hip: Secondary | ICD-10-CM

## 2019-08-24 DIAGNOSIS — M7502 Adhesive capsulitis of left shoulder: Secondary | ICD-10-CM

## 2019-08-24 DIAGNOSIS — R29898 Other symptoms and signs involving the musculoskeletal system: Secondary | ICD-10-CM | POA: Diagnosis not present

## 2019-08-24 MED ORDER — METHYLPREDNISOLONE ACETATE 40 MG/ML IJ SUSP
80.0000 mg | Freq: Once | INTRAMUSCULAR | Status: AC
  Administered 2019-08-24: 80 mg via INTRA_ARTICULAR

## 2019-08-24 NOTE — Patient Instructions (Signed)
Hip Rehab:  Hip Flexion: Toe up to ceiling, laying on your back. Lift your whole leg, 3 sets. Work up to being able to do #30 with each set.  Hip elevations, Toe and leg turned out to side.  Lift whole leg, 3 sets. Work up to being able to do #30 with each set.  Hip Abductions: Lying on side, straight out to side. 3 sets, work up to being able to do #30 with each set.  At the beginning you may only be able to do a lot less, try to do #10.  

## 2019-08-24 NOTE — Progress Notes (Signed)
Emily Tufano T. Roper Tolson, MD Primary Care and Sports Medicine [facility] [place], [address] Phone: [phone]   FAX: [place] - 58 y.o. female   MRN [id number]   Date of Birth: 30-Aug-1961  Visit Date: 08/24/2019   PCP: [name], NP   Referred by: [name], NP  Chief Complaint  Patient presents with   Shoulder Pain    Left   Subjective:   Emily Moon is a 58 y.o. very pleasant female patient with Body mass index is 31.82 kg/m. who presents with the following:  Swimming some now. Some pain down her thighs.  Now using a cane.  She is a well-known patient who has multiple sclerosis, not seen her many times for various musculoskeletal problems.  She has been going to the pool and doing some pool workouts and treading water, and in doing so she is developed some left-sided shoulder pain.  She has had some loss of motion and now is stiff and hurts at nighttime.  It does not hurt when she keeps it next to her side and it does if she tries to do abduction and flexion.  She also has some right lateral hip pain and she has noticed some weakness as well.  Taking some advil.    Past Medical History, Surgical History, Social History, Family History, Problem List, Medications, and Allergies have been reviewed and updated if relevant.  Patient Active Problem List   Diagnosis Date Noted   Bipolar disorder ([place]) 05/21/2014    Priority: High   MULTIPLE SCLEROSIS, PROGRESSIVE/RELAPSING 08/28/2010    Priority: High   Chronic pain syndrome 01/07/2011    Priority: Medium   Rash and nonspecific skin eruption 02/20/2019   Asthma 02/10/2019   Epigastric pain 12/21/2018   OSA (obstructive sleep apnea) 12/02/2018   Joint swelling 10/24/2018   Environmental and seasonal allergies 10/24/2018   Greater trochanteric bursitis of right hip 09/28/2018   Acute pain of right shoulder 09/28/2018     Prediabetes 07/22/2018   Bipolar I disorder, most recent episode (or current) manic ([place]) 05/20/2018   Vitamin D deficiency 02/15/2018   Preventative health care 11/24/2017   Urinary frequency 03/30/2017   B12 deficiency 05/22/2016   Dizziness 03/16/2016   Medicare annual wellness visit, subsequent 10/25/2015   Back pain 10/31/2014   Weight gain 06/21/2014   Deformity of right foot 12/27/2013   Hip dysplasia, 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 09/15/2013   Insomnia 09/01/2011   Obesity 05/04/2011   HLD (hyperlipidemia) 01/20/2011   Tobacco abuse 01/20/2011   Chest pain 01/20/2011   RENAL CALCULUS, RECURRENT 11/13/2010   GERD 08/28/2010   Osteoarthritis 08/28/2010   NEPHROLITHIASIS, HX OF 08/28/2010    Past Medical History:  Diagnosis Date   Arthritis    osteo   Asthma    Bipolar disorder ([place]) 05/21/14   Cataracts, bilateral    DDD (degenerative disc disease), cervical    also back   Depression    Dizziness    Positional   Edema    feet/legs   Fibromyalgia syndrome    Fungal infection    Finger nails   GERD (gastroesophageal reflux disease)    Gout    Headache    seasonal allergies   Heart palpitations    Hip dysplasia, 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   Hypercholesterolemia    Multiple sclerosis (HCC)    weakness  Nephrolithiasis    kidney stones   Osteoporosis    osteoarthritis   Pneumonia    PONV (postoperative nausea and vomiting)    no problem after cataract surgery   Psoriasis    Renal stone    Shortness of breath dyspnea    wheezing   Sleep apnea 2012   sleep study / slight, no interventions   Urinary frequency     Past Surgical History:  Procedure Laterality Date   CATARACT EXTRACTION W/PHACO Left 05/21/2015   Procedure: CATARACT EXTRACTION PHACO AND INTRAOCULAR LENS PLACEMENT ([place]);  Surgeon: Birder Robson, MD;  Location: [facility] ORS;  Service: Ophthalmology;   Laterality: Left;  [place] 00:35 AP% 22.9 CDE 8.11 fluid pack lot [id number]   CATARACT EXTRACTION W/PHACO Right 06/04/2015   Procedure: CATARACT EXTRACTION PHACO AND INTRAOCULAR LENS PLACEMENT ([facility]);  Surgeon: Birder Robson, MD;  Location: [facility];  Service: Ophthalmology;  Laterality: Right;  US:00:48 AP%: 10.5 CDE:5.08 Fluid lot [id number]   CYSTOSCOPY/URETEROSCOPY/HOLMIUM LASER/STENT PLACEMENT Bilateral 09/22/2016   Procedure: CYSTOSCOPY/URETEROSCOPY/HOLMIUM LASER/STENT PLACEMENT;  Surgeon: Hollice Espy, MD;  Location: [facility];  Service: Urology;  Laterality: Bilateral;   EYE SURGERY  2015   tissue biopsy   FOOT SURGERY  2015   JOINT REPLACEMENT Left 2013   hip replacement   LITHOTRIPSY     PTOSIS REPAIR Bilateral 02/18/2016   Procedure: BILATERAL PTOSIS REPAIR UPPER EYELIDS;  Surgeon: Karle Starch, MD;  Location: [place];  Service: Ophthalmology;  Laterality: Bilateral;  LEAVE PT EARLY AM   thumb surgery Right    TONSILLECTOMY  1973    Social History   Socioeconomic History   Marital status: Divorced    Spouse name: Not on file   Number of children: 1   Years of education: Not on file   Highest education level: Not on file  Occupational History   Occupation: Therapist, art Rep at [employer] strain: Not on file   Food insecurity    Worry: Not on file    Inability: Not on file   Transportation needs    Medical: Not on file    Non-medical: Not on file  Tobacco Use   Smoking status: Former Smoker    Years: 25.00    Types: Cigarettes    Quit date: 11/16/2012    Years since quitting: 6.7   Smokeless tobacco: Never Used   Tobacco comment: occasional use  Substance and Sexual Activity   Alcohol use: No    Alcohol/week: 0.0 standard drinks   Drug use: No    Types: Methylphenidate   Sexual activity: Never  Lifestyle   Physical activity    Days per week: Not on file    Minutes  per session: Not on file   Stress: Not on file  Relationships   Social connections    Talks on phone: Not on file    Gets together: Not on file    Attends religious service: Not on file    Active member of club or organization: Not on file    Attends meetings of clubs or organizations: Not on file    Relationship status: Not on file   Intimate partner violence    Fear of current or ex partner: Not on file    Emotionally abused: Not on file    Physically abused: Not on file    Forced sexual activity: Not on file  Other Topics Concern   Not on  file  Social History Narrative   Not on file    Family History  Problem Relation Age of Onset   Cancer Father        Abdomen with mastasis   Cancer Mother    Heart disease Mother    Kidney disease Neg Hx    Bladder Cancer Neg Hx    Prostate cancer Neg Hx    Kidney cancer Neg Hx     Allergies  Allergen Reactions   Albuterol Shortness Of Breath and Other (See Comments)    Makes pt feel jittery/ tacycardic   Crestor [Rosuvastatin] Other (See Comments)    Joint pain, muscle pain, and hair loss   Halcion [Triazolam] Other (See Comments)    Dizziness,headaches,bladder problems   Levaquin [Levofloxacin In D5w] Diarrhea and Itching    Shoulder pain   Naproxen Sodium Swelling    Patient tolerates in small doses   Tylenol [Acetaminophen] Swelling    Patient tolerates in small doses   Cefaclor Other (See Comments)    Doesn't remember---unsure if actually allergic    Diclofenac Sodium Other (See Comments)    "made very sick"   Sulfa Antibiotics Itching    Unsure of reaction possibly itching   Tramadol Itching and Nausea And Vomiting   Aripiprazole Other (See Comments)    Muscle tension/cramping   Ibuprofen Swelling    Patient tolerates in small doses    Medication list reviewed and updated in full in [place].  GEN: No fevers, chills. Nontoxic. Primarily MSK c/o today. MSK: Detailed in the  HPI GI: tolerating PO intake without difficulty Neuro: No numbness, parasthesias, or tingling associated. Otherwise the pertinent positives of the ROS are noted above.   Objective:   BP 118/78    Pulse 96    Temp 97.8 F (36.6 C) (Temporal)    Ht 5\' 9"  (1.753 m)    Wt 215 lb 8 oz (97.8 kg)    LMP 08/23/2014    SpO2 96%    BMI 31.82 kg/m    GEN: WDWN, NAD, Non-toxic, Alert & Oriented x 3 HEENT: Atraumatic, Normocephalic.  Ears and Nose: No external deformity. EXTR: No clubbing/cyanosis/edema NEURO: Normal gait.  PSYCH: Normally interactive. Conversant. Not depressed or anxious appearing.  Calm demeanor.   Shoulder: R and L Inspection: No muscle wasting or winging Ecchymosis/edema: neg  AC joint, scapula, clavicle: NT Cervical spine: NT, full ROM Spurling's: neg ABNORMAL SIDE TESTED: L UNLESS OTHERWISE NOTED, THE CONTRALATERAL SIDE HAS FULL RANGE OF MOTION. Abduction: 5/5, LIMITED TO 125 DEGREES Flexion: 5/5, LIMITED TO 125 DEGNO ROM  IR, lift-off: 5/5. TESTED AT 90 DEGREES OF ABDUCTION, LIMITED TO 10 DEGREES ER at neutral:  5/5, TESTED AT 90 DEGREES OF ABDUCTION, LIMITED TO 45 DEGREES AC crossover and compression: PAIN Drop Test: neg Empty Can: neg Supraspinatus insertion: NT Bicipital groove: NT ALL OTHER SPECIAL TESTING EQUIVOCAL GIVEN LOSS OF MOTION C5-T1 intact Sensation intact Grip 5/5    HIP EXAM: SIDE: r ROM: Abduction, Flexion, Internal and External range of motion: full Pain with terminal IROM and EROM: minimal  GTB: TTP on the R SLR: NEG Knees: No effusion FABER: NT REVERSE FABER: NT, neg Piriformis: NT at direct palpation Str: flexion: 3+/5 abduction: 3+/5 adduction: 4/5 Strength testing non-tender     Radiology: No results found.  Assessment and Plan:     ICD-10-CM   1. Adhesive capsulitis of left shoulder  M75.02 methylPREDNISolone acetate (DEPO-MEDROL) injection 80 mg  2. Trochanteric bursitis of right  hip  M70.61 methylPREDNISolone acetate  (DEPO-MEDROL) injection 80 mg  3. Weakness of both hips  R29.898    Multiple ongoing issues in the setting of multiple sclerosis.  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  Along with this she also does have some mild iliotibial band syndrome.  I suspect that the tensor fascia lata is also involved.  She is already doing a number of conservative things, and I suspect her use in the pool may have aggravated her shoulder.  I gave her some home rehab to do for both of these, and reviewed length of time and clinical course with a frozen shoulder.  Intraarticular Shoulder Aspiration/Injection Procedure Note Emily Moon Jan 14, 1961 Date of procedure: 08/24/2019  Procedure: Large Joint Aspiration / Injection of Shoulder, Intraarticular, L Indications: Pain  Procedure Details Verbal consent was obtained from the patient. Risks including infection explained and contrasted with benefits and alternatives. Patient prepped with Chloraprep and Ethyl Chloride used for anesthesia. An intraarticular shoulder injection was performed using the posterior approach; needle placed into joint capsule without difficulty. The patient tolerated the procedure well and had decreased pain post injection. No complications. Injection: 8 cc of Lidocaine 1% and 2 mL Depo-Medrol 40 mg. Needle: 21 gauge, 2 inch   Aspiration/Injection Procedure Note Emily Moon [birth year] Date of procedure: 08/24/2019  Procedure: Large Joint Aspiration / Injection of Hip, Trochanteric Bursa, R Indications: Pain  Procedure Details Verbal consent obtained. Risks (including infection, potential atrophy), benefits, and alternatives reviewed. Greater trochanter sterilely prepped with Chloraprep. Ethyl Chloride used for anesthesia. 8 cc of Lidocaine 1% injected with 2 mL of Depo-Medrol 40 mg into trochanteric bursa at area of maximal tenderness at greater trochanter. Needle taken  to bone to troch bursa, flows easily. Bursa massaged. No bleeding and no complications. Decreased pain after injection. Needle: 22 gauge spinal needle Medication: 2 mL of Depo-Medrol 40 mg, equaling Depo-Medrol 80 mg total   Follow-up: 6 weeks  Meds ordered this encounter  Medications   methylPREDNISolone acetate (DEPO-MEDROL) injection 80 mg   methylPREDNISolone acetate (DEPO-MEDROL) injection 80 mg   No orders of the defined types were placed in this encounter.   Signed,  Maud Deed. Caedence Snowden, MD   Outpatient Encounter Medications as of 08/24/2019  Medication Sig   ASPERCREME LIDOCAINE EX Apply 1 application topically 4 (four) times daily as needed (for pain.).    aspirin 325 MG tablet Take 325 mg by mouth every 4 (four) hours as needed for headache.    atorvastatin (LIPITOR) 20 MG tablet TAKE 1 TABLET BY MOUTH EVERY DAY   Azelastine HCl 137 MCG/SPRAY SOLN PLACE 2 SPRAYS INTO BOTH NOSTRILS 2 (TWO) TIMES DAILY   BIOTIN PO Take 200 mg by mouth daily.    calcipotriene-betamethasone (TACLONEX) ointment Apply 1 application topically daily as needed (for psorasis).    Cholecalciferol (VITAMIN D3) 10000 units capsule Take 30,000 Units by mouth daily.    clobetasol cream (TEMOVATE) [id number] % Apply 1 application topically 2 (two) times daily.   cyanocobalamin (,VITAMIN B-12,) 1000 MCG/ML injection Inject 1 mL (1,000 mcg total) into the muscle every 3 (three) months.   diclofenac sodium (VOLTAREN) 1 % GEL Apply 4 g topically 4 (four) times daily.   docusate sodium (COLACE) 100 MG capsule Take 1 capsule (100 mg total) by mouth 2 (two) times daily.   DULoxetine (CYMBALTA) 20 MG capsule Take 40 mg by mouth daily.    HYDROcodone-acetaminophen (NORCO) 10-325  MG tablet Take 1 tablet by mouth every 8 (eight) hours as needed for moderate pain or severe pain. Supervising physician; Dr Eliezer Lofts; DEA# [id number]   hydroquinone 4 % cream 1 APPLICATION TOPICALLY DAILY AS NEEDED FOR  BLEMISHES.   hydrOXYzine (VISTARIL) 50 MG capsule TAKE 1 CAPSLE BY MOUTH AT NOON, 1 AT 6 PM, AND 1 AS NEEDED   lamoTRIgine (LAMICTAL) 150 MG tablet Take 2 tablets (300 mg total) by mouth at bedtime.   levalbuterol (XOPENEX HFA) 45 MCG/ACT inhaler Inhale 1-2 puffs into the lungs every 6 (six) hours as needed for wheezing.   levalbuterol (XOPENEX) 1.25 MG/3ML nebulizer solution USE 1 VIAL VIA NEBULIZER EVERY 4 HOURS AS NEEDED FOR WHEEZING OR SHORTNESS OF BREATH   Lysine 1000 MG TABS Take 2,000-4,000 mg by mouth at bedtime. 2000 mg scheduled at bedtime and patient will take 4000 mg if she has outbreak   Magnesium 500 MG TABS Take 1 tablet by mouth at bedtime.   neomycin-bacitracin-polymyxin (NEOSPORIN) [phone] ointment Apply 1 application topically 4 (four) times daily as needed (for cut/scrapes.).   pantoprazole (PROTONIX) 40 MG tablet Take 1 tablet (40 mg total) by mouth daily. For heartburn.   phenazopyridine (AZO-TABS) 95 MG tablet Take 95 mg by mouth 3 (three) times daily as needed for pain.   polyethylene glycol powder (GLYCOLAX/MIRALAX) powder MIX 17 GRAMS (1 CAPFUL) WITH 4-8 OZ OF LIQUID AND TAKE BY MOUTH TWICE DAILY AS NEEDED   temazepam (RESTORIL) 30 MG capsule TAKE ONE TO TWO CAPSULES BY MOUTH NIGHTLY AT BEDTIME   tiZANidine (ZANAFLEX) 4 MG tablet TAKE 2 TABLETS (8 MG TOTAL) BY MOUTH EVERY 6 (SIX) HOURS AS NEEDED FOR MUSCLE SPASMS.   traZODone (DESYREL) 50 MG tablet Take 2 tablets (100 mg total) by mouth at bedtime.   triamcinolone cream (KENALOG) 0.1 % Apply 1 application topically 2 (two) times daily.   [DISCONTINUED] DULoxetine (CYMBALTA) 60 MG capsule Take 1 capsule (60 mg total) by mouth at bedtime.   [EXPIRED] methylPREDNISolone acetate (DEPO-MEDROL) injection 80 mg    [EXPIRED] methylPREDNISolone acetate (DEPO-MEDROL) injection 80 mg    No facility-administered encounter medications on file as of 08/24/2019.

## 2019-08-29 DIAGNOSIS — G35 Multiple sclerosis: Secondary | ICD-10-CM | POA: Diagnosis not present

## 2019-08-30 ENCOUNTER — Other Ambulatory Visit: Payer: Self-pay

## 2019-08-30 ENCOUNTER — Ambulatory Visit (INDEPENDENT_AMBULATORY_CARE_PROVIDER_SITE_OTHER): Payer: PPO | Admitting: Psychiatry

## 2019-08-30 DIAGNOSIS — F3160 Bipolar disorder, current episode mixed, unspecified: Secondary | ICD-10-CM

## 2019-08-30 MED ORDER — HYDROXYZINE PAMOATE 50 MG PO CAPS: ORAL_CAPSULE | ORAL | 2 refills | Status: DC

## 2019-08-30 MED ORDER — TRAZODONE HCL 50 MG PO TABS: 100.0000 mg | ORAL_TABLET | Freq: Every day | ORAL | 7 refills | Status: DC

## 2019-08-30 MED ORDER — LAMOTRIGINE 150 MG PO TABS: 300.0000 mg | ORAL_TABLET | Freq: Every day | ORAL | 5 refills | Status: DC

## 2019-08-30 MED ORDER — TEMAZEPAM 30 MG PO CAPS: ORAL_CAPSULE | ORAL | 4 refills | Status: DC

## 2019-08-30 MED ORDER — DULOXETINE HCL 20 MG PO CPEP: 40.0000 mg | ORAL_CAPSULE | Freq: Every day | ORAL | 5 refills | Status: DC

## 2019-08-30 NOTE — Progress Notes (Signed)
Psychiatric Initial Adult Assessment   Patient Identification: Emily Moon MRN:  [id number] Date of Evaluation:  08/30/2019 Referral Source: From the community Chief Complaint: Feels exhausted  At this time the patient seems to be at her baseline.  She was actually feeling pretty depressed for a few weeks but the last couple of days a good friend has come to visit and forced her to clean up her house.  She says the chaos in her house represents what is going on inside of her head which is that she feels scattered and distressed.  Cleaning up her house with a good friend makes her feel much better.  Her mood is good today.  Her sleep is stable.  She takes 60 mg of Restoril.  Patient son calls her on a daily basis.  Patient is appetite is erratic but overall she is gaining weight.  She sees her neurologist regularly.  She is having some more problems walking related to her progressive MS.  Patient drinks no alcohol at all and uses no drugs.  The patient's mood is relatively stable.  She has no evidence of psychosis.  She certainly is not suicidal.  Her energy level is only fair.  The patient lives alone.  I would say overall emotionally the patient is at her baseline.  She has personality features that reflect her seems similar to mania.  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  Nonetheless the 300 mg of Lamictal seem to be helpful.  The patient could not tolerate 60 mg of Cymbalta as we prescribed last time and she went back down to 40 milligrams.  Patient: Emily Moon  Procedure(s) Performed: * No surgery found *  Anesthes  Patient location:   Post pain:   Post assessment:   Last Vitals:  There were no vitals filed for this visit.  Post vital signs:   Level of consciousness:   Complications: . Associated Signs/Symptoms: Depression Symptoms:  hopelessness, (Hypo) Manic Symptoms:   Anxiety Symptoms:   Psychotic Symptoms:   PTSD Symptoms:   Past  Psychiatric History: The patient was seen in a psychiatric office and takes multiple medications she's not been recently hospitalized.  Previous Psychotropic Medications: Yes   Substance Abuse History in the last 12 months:  No.  Consequences of Substance Abuse: Negative  Past Medical History:  Past Medical History:  Diagnosis Date  . Arthritis    osteo  . Asthma   . Bipolar disorder ([place]) 05/21/14  . Cataracts, bilateral   . DDD (degenerative disc disease), cervical    also back  . Depression   . Dizziness    Positional  . Edema    feet/legs  . Fibromyalgia syndrome   . Fungal infection    Finger nails  . GERD (gastroesophageal reflux disease)   . Gout   . Headache    seasonal allergies  . Heart palpitations   . 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 Hypercholesterolemia   . Multiple sclerosis (HCC)    weakness  . Nephrolithiasis    kidney stones  . Osteoporosis    osteoarthritis  . Pneumonia   . PONV (postoperative nausea and vomiting)    no problem after cataract surgery  . Psoriasis   . Renal stone   . Shortness of breath dyspnea    wheezing  . Sleep apnea 2012   sleep study / slight, no interventions  . Urinary frequency     Past Surgical History:  Procedure Laterality Date  . CATARACT EXTRACTION W/PHACO Left 05/21/2015   Procedure: CATARACT EXTRACTION PHACO AND INTRAOCULAR LENS PLACEMENT (IOC);  Surgeon: Birder Robson, MD;  Location: [facility];  Service: Ophthalmology;  Laterality: Left;  [place] 00:35 AP% 22.9 CDE 8.11 fluid pack lot [id number]  . CATARACT EXTRACTION W/PHACO Right 06/04/2015   Procedure: CATARACT EXTRACTION PHACO AND INTRAOCULAR LENS PLACEMENT (IOC);  Surgeon: Birder Robson, MD;  Location: [facility];  Service: Ophthalmology;  Laterality: Right;  US:00:48 AP%: 10.5 CDE:5.08 Fluid lot [id number]  . CYSTOSCOPY/URETEROSCOPY/HOLMIUM LASER/STENT PLACEMENT Bilateral 09/22/2016   Procedure: CYSTOSCOPY/URETEROSCOPY/HOLMIUM LASER/STENT  PLACEMENT;  Surgeon: Hollice Espy, MD;  Location: [facility];  Service: Urology;  Laterality: Bilateral;  . EYE SURGERY  2015   tissue biopsy  . FOOT SURGERY  2015  . JOINT REPLACEMENT Left 2013   hip replacement  . LITHOTRIPSY    . PTOSIS REPAIR Bilateral 02/18/2016   Procedure: BILATERAL PTOSIS REPAIR UPPER EYELIDS;  Surgeon: Karle Starch, MD;  Location: [place];  Service: Ophthalmology;  Laterality: Bilateral;  LEAVE PT EARLY AM  . thumb surgery Right   . TONSILLECTOMY  1973    Family Psychiatric History:   Family History:  Family History  Problem Relation Age of Onset  . Cancer Father        Abdomen with mastasis  . Cancer Mother   . Heart disease Mother   . Kidney disease Neg Hx   . Bladder Cancer Neg Hx   . Prostate cancer Neg Hx   . Kidney cancer Neg Hx     Social History:   Social History   Socioeconomic History  . Marital status: Divorced    Spouse name: Not on file  . Number of children: 1  . Years of education: Not on file  . Highest education level: Not on file  Occupational History  . Occupation: Therapist, art Rep at [employer]  . Financial resource strain: Not on file  . Food insecurity    Worry: Not on file    Inability: Not on file  . Transportation needs    Medical: Not on file    Non-medical: Not on file  Tobacco Use  . Smoking status: Former Smoker    Years: 25.00    Types: Cigarettes    Quit date: 11/16/2012    Years since quitting: 6.7  . Smokeless tobacco: Never Used  . Tobacco comment: occasional use  Substance and Sexual Activity  . Alcohol use: No    Alcohol/week: 0.0 standard drinks  . Drug use: No    Types: Methylphenidate  . Sexual activity: Never  Lifestyle  . Physical activity    Days per week: Not on file    Minutes per session: Not on file  . Stress: Not on file  Relationships  . Social Herbalist on phone: Not on file    Gets together: Not on file    Attends  religious service: Not on file    Active member of club or organization: Not on file    Attends meetings of clubs or organizations: Not on file    Relationship status: Not on file  Other Topics Concern  . Not on file  Social History Narrative  . Not on file    Additional Social History:   Allergies:   Allergies  Allergen Reactions  . Albuterol Shortness Of Breath and Other (See Comments)    Makes pt feel jittery/  tacycardic  . Crestor [Rosuvastatin] Other (See Comments)    Joint pain, muscle pain, and hair loss  . Halcion [Triazolam] Other (See Comments)    Dizziness,headaches,bladder problems  . Levaquin [Levofloxacin In D5w] Diarrhea and Itching    Shoulder pain  . Naproxen Sodium Swelling    Patient tolerates in small doses  . Tylenol [Acetaminophen] Swelling    Patient tolerates in small doses  . Cefaclor Other (See Comments)    Doesn't remember---unsure if actually allergic   . Diclofenac Sodium Other (See Comments)    "made very sick"  . Sulfa Antibiotics Itching    Unsure of reaction possibly itching  . Tramadol Itching and Nausea And Vomiting  . Aripiprazole Other (See Comments)    Muscle tension/cramping  . Ibuprofen Swelling    Patient tolerates in small doses    Metabolic Disorder Labs: Lab Results  Component Value Date   HGBA1C 5.9 08/15/2019   No results found for: PROLACTIN Lab Results  Component Value Date   CHOL 353 (H) 05/10/2019   TRIG 210.0 (H) 05/10/2019   HDL 61.20 05/10/2019   CHOLHDL 6 05/10/2019   VLDL 42.0 (H) 05/10/2019   LDLCALC 223 (H) 11/13/2014     Current Medications: Current Outpatient Medications  Medication Sig Dispense Refill  . ASPERCREME LIDOCAINE EX Apply 1 application topically 4 (four) times daily as needed (for pain.).     Marland Kitchen aspirin 325 MG tablet Take 325 mg by mouth every 4 (four) hours as needed for headache.     Marland Kitchen atorvastatin (LIPITOR) 20 MG tablet TAKE 1 TABLET BY MOUTH EVERY DAY 90 tablet 0  . Azelastine HCl  137 MCG/SPRAY SOLN PLACE 2 SPRAYS INTO BOTH NOSTRILS 2 (TWO) TIMES DAILY 30 mL 2  . BIOTIN PO Take 200 mg by mouth daily.     . calcipotriene-betamethasone (TACLONEX) ointment Apply 1 application topically daily as needed (for psorasis).     . Cholecalciferol (VITAMIN D3) 10000 units capsule Take 30,000 Units by mouth daily.     . clobetasol cream (TEMOVATE) [id number] % Apply 1 application topically 2 (two) times daily. 30 g 2  . cyanocobalamin (,VITAMIN B-12,) 1000 MCG/ML injection Inject 1 mL (1,000 mcg total) into the muscle every 3 (three) months. 1 mL 0  . diclofenac sodium (VOLTAREN) 1 % GEL Apply 4 g topically 4 (four) times daily. 500 g 5  . docusate sodium (COLACE) 100 MG capsule Take 1 capsule (100 mg total) by mouth 2 (two) times daily. 60 capsule 0  . DULoxetine (CYMBALTA) 20 MG capsule Take 2 capsules (40 mg total) by mouth daily. 60 capsule 5  . HYDROcodone-acetaminophen (NORCO) 10-325 MG tablet Take 1 tablet by mouth every 8 (eight) hours as needed for moderate pain or severe pain. Supervising physician; Dr Eliezer Lofts; DEA# [id number] 90 tablet 0  . hydroquinone 4 % cream 1 APPLICATION TOPICALLY DAILY AS NEEDED FOR BLEMISHES.  3  . hydrOXYzine (VISTARIL) 50 MG capsule TAKE 1 CAPSLE BY MOUTH AT NOON, 1 AT 6 PM, AND 1 AS NEEDED 270 capsule 2  . lamoTRIgine (LAMICTAL) 150 MG tablet Take 2 tablets (300 mg total) by mouth at bedtime. 60 tablet 5  . levalbuterol (XOPENEX HFA) 45 MCG/ACT inhaler Inhale 1-2 puffs into the lungs every 6 (six) hours as needed for wheezing. 1 Inhaler 0  . levalbuterol (XOPENEX) 1.25 MG/3ML nebulizer solution USE 1 VIAL VIA NEBULIZER EVERY 4 HOURS AS NEEDED FOR WHEEZING OR SHORTNESS OF BREATH 270 mL 1  . Lysine  1000 MG TABS Take 2,000-4,000 mg by mouth at bedtime. 2000 mg scheduled at bedtime and patient will take 4000 mg if she has outbreak    . Magnesium 500 MG TABS Take 1 tablet by mouth at bedtime.    Marland Kitchen neomycin-bacitracin-polymyxin (NEOSPORIN) [phone] ointment  Apply 1 application topically 4 (four) times daily as needed (for cut/scrapes.).    Marland Kitchen pantoprazole (PROTONIX) 40 MG tablet Take 1 tablet (40 mg total) by mouth daily. For heartburn. 90 tablet 1  . phenazopyridine (AZO-TABS) 95 MG tablet Take 95 mg by mouth 3 (three) times daily as needed for pain.    . polyethylene glycol powder (GLYCOLAX/MIRALAX) powder MIX 17 GRAMS (1 CAPFUL) WITH 4-8 OZ OF LIQUID AND TAKE BY MOUTH TWICE DAILY AS NEEDED 527 g 0  . temazepam (RESTORIL) 30 MG capsule TAKE ONE TO TWO CAPSULES BY MOUTH NIGHTLY AT BEDTIME 60 capsule 4  . tiZANidine (ZANAFLEX) 4 MG tablet TAKE 2 TABLETS (8 MG TOTAL) BY MOUTH EVERY 6 (SIX) HOURS AS NEEDED FOR MUSCLE SPASMS. 720 tablet 0  . traZODone (DESYREL) 50 MG tablet Take 2 tablets (100 mg total) by mouth at bedtime. 60 tablet 7  . triamcinolone cream (KENALOG) 0.1 % Apply 1 application topically 2 (two) times daily. 30 g 0   No current facility-administered medications for this visit.     Neurologic: Headache: No Seizure: No Paresthesias:No  Musculoskeletal: Strength & Muscle Tone: within normal limits Gait & Station: normal Patient leans: N/A  Psychiatric Specialty Exam: ROS  Last menstrual period 08/23/2014.There is no height or weight on file to calculate BMI.  General Appearance: Casual  Eye Contact:  Good  Speech:  Clear and Coherent  Volume:  Normal  Mood:  Negative  Affect:  Appropriate  Thought Process:  Goal Directed  Orientation:  NA  Thought Content:  Logical  Suicidal Thoughts:  No  Homicidal Thoughts:  No  Memory:  Negative  Judgement:  Good  Insight:  Good  Psychomotor Activity:  Normal  Concentration:    Recall:    Fund of Knowledge:Good  Language: Good  Akathisia:  No  Handed:  Right  AIMS (if indicated):    Assets:  Desire for Improvement  ADL's:  Intact  Cognition: WNL  Sleep:      10/14/20204:08 PM    At this time the patient is diagnosis is major depression.  She will continue taking  Cymbalta 40 mg and Lamictal 300 mg.  Her second problem is insomnia.  The patient continue taking Restoril at 60 mg.  Patient is not in therapy at this time.  The patient is functioning fairly well.  Her biggest problems are related to her multiple sclerosis.  Drinking is no longer an issue.  She also continue taking Vistaril for anxiety.  She will return to see me in 4 months.

## 2019-09-05 DIAGNOSIS — M1990 Unspecified osteoarthritis, unspecified site: Secondary | ICD-10-CM

## 2019-09-06 MED ORDER — HYDROCODONE-ACETAMINOPHEN 10-325 MG PO TABS: 1.0000 | ORAL_TABLET | Freq: Three times a day (TID) | ORAL | 0 refills | Status: DC | PRN

## 2019-09-07 ENCOUNTER — Other Ambulatory Visit: Payer: Self-pay

## 2019-09-07 ENCOUNTER — Ambulatory Visit (INDEPENDENT_AMBULATORY_CARE_PROVIDER_SITE_OTHER): Payer: PPO | Admitting: Gastroenterology

## 2019-09-07 ENCOUNTER — Telehealth: Payer: Self-pay | Admitting: Gastroenterology

## 2019-09-07 DIAGNOSIS — R1312 Dysphagia, oropharyngeal phase: Secondary | ICD-10-CM

## 2019-09-07 NOTE — Telephone Encounter (Signed)
Left vm to offer 4 week f/u apt with Dr. Vicente Males

## 2019-09-07 NOTE — Progress Notes (Signed)
Emily Moon  [address]  [place]  [place], [place] [address]  Main: [phone]  Fax: [phone]   Gastroenterology Consultation  Referring Provider:     [name], NP Primary Care Physician:  [name], NP Reason for Consultation:     Dysphagia        HPI:   Virtual Visit via video  Note  I connected with patient on 09/07/19 at  1:45 PM EDT by video  and verified that I am speaking with the correct person using two identifiers.   I discussed the limitations, risks, security and privacy concerns of performing an evaluation and management service by video and the availability of in person appointments. I also discussed with the patient that there may be a patient responsible charge related to this service. The patient expressed understanding and agreed to proceed.  Location of the patient: Home Location of provider: Home Participating persons: Patient and provider only   History of Present Illness: Dysphagia  Emily Moon is a 59 y.o. y/o female referred for consultation & management  by. [name], NP.  She has a history of multiple sclerosis and has been having difficulty swallowing and choking.  Diagnosed with multiple sclerosis in 1991. She says that she is having issues with swallowing for the past few months not really progressed she says that she has difficulty pushing the food to the back of her throat.  Once it reaches the back of her throat goes down okay.  Affect liquids more often than solids.  No episodes of impaction.  She probably has history of acid reflux.  She does say that acid comes up in her throat when she lies flat.  She suffers from sleep apnea and cannot tolerate CPAP.  Denies any prior EGD.  She is on a PPI but does not take it regularly.   Presently she is in the midst of a flare of her multiple sclerosis and states that the worst she has ever been.  She is using a walker  Past Medical History:  Diagnosis  Date  . Arthritis    osteo  . Asthma   . Bipolar disorder ([place]) 05/21/14  . Cataracts, bilateral   . DDD (degenerative disc disease), cervical    also back  . Depression   . Dizziness    Positional  . Edema    feet/legs  . Fibromyalgia syndrome   . Fungal infection    Finger nails  . GERD (gastroesophageal reflux disease)   . Gout   . Headache    seasonal allergies  . Heart palpitations   . 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 Hypercholesterolemia   . Multiple sclerosis (HCC)    weakness  . Nephrolithiasis    kidney stones  . Osteoporosis    osteoarthritis  . Pneumonia   . PONV (postoperative nausea and vomiting)    no problem after cataract surgery  . Psoriasis   . Renal stone   . Shortness of breath dyspnea    wheezing  . Sleep apnea 2012   sleep study / slight, no interventions  . Urinary frequency     Past Surgical History:  Procedure Laterality Date  . CATARACT EXTRACTION W/PHACO Left 05/21/2015   Procedure: CATARACT EXTRACTION PHACO AND INTRAOCULAR LENS PLACEMENT (IOC);  Surgeon: Birder Robson, MD;  Location: [facility];  Service: Ophthalmology;  Laterality: Left;  [place] 00:35 AP% 22.9 CDE 8.11 fluid pack lot [id number]  . CATARACT EXTRACTION W/PHACO  Right 06/04/2015   Procedure: CATARACT EXTRACTION PHACO AND INTRAOCULAR LENS PLACEMENT (IOC);  Surgeon: Birder Robson, MD;  Location: [facility];  Service: Ophthalmology;  Laterality: Right;  US:00:48 AP%: 10.5 CDE:5.08 Fluid lot [id number]  . CYSTOSCOPY/URETEROSCOPY/HOLMIUM LASER/STENT PLACEMENT Bilateral 09/22/2016   Procedure: CYSTOSCOPY/URETEROSCOPY/HOLMIUM LASER/STENT PLACEMENT;  Surgeon: Hollice Espy, MD;  Location: [facility];  Service: Urology;  Laterality: Bilateral;  . EYE SURGERY  2015   tissue biopsy  . FOOT SURGERY  2015  . JOINT REPLACEMENT Left 2013   hip replacement  . LITHOTRIPSY    . PTOSIS REPAIR Bilateral 02/18/2016   Procedure: BILATERAL PTOSIS REPAIR UPPER EYELIDS;  Surgeon: Karle Starch, MD;  Location: [place];  Service: Ophthalmology;  Laterality: Bilateral;  LEAVE PT EARLY AM  . thumb surgery Right   . TONSILLECTOMY  1973    Prior to Admission medications   Medication Sig Start Date End Date Taking? Authorizing Provider  ASPERCREME LIDOCAINE EX Apply 1 application topically 4 (four) times daily as needed (for pain.).     [provider]  aspirin 325 MG tablet Take 325 mg by mouth every 4 (four) hours as needed for headache.     [provider]  atorvastatin (LIPITOR) 20 MG tablet TAKE 1 TABLET BY MOUTH EVERY DAY 08/02/19   [name], NP  Azelastine HCl 137 MCG/SPRAY SOLN PLACE 2 SPRAYS INTO BOTH NOSTRILS 2 (TWO) TIMES DAILY 08/01/19   [name], NP  BIOTIN PO Take 200 mg by mouth daily.     [provider]  calcipotriene-betamethasone (TACLONEX) ointment Apply 1 application topically daily as needed (for psorasis).     [provider]  Cholecalciferol (VITAMIN D3) 10000 units capsule Take 30,000 Units by mouth daily.     [provider]  clobetasol cream (TEMOVATE) [id number] % Apply 1 application topically 2 (two) times daily. 12/16/16   Copland, Frederico Hamman, MD  cyanocobalamin (,VITAMIN B-12,) 1000 MCG/ML injection Inject 1 mL (1,000 mcg total) into the muscle every 3 (three) months. 08/08/18   [name], NP  diclofenac sodium (VOLTAREN) 1 % GEL Apply 4 g topically 4 (four) times daily. 11/15/14   Jearld Fenton, NP  docusate sodium (COLACE) 100 MG capsule Take 1 capsule (100 mg total) by mouth 2 (two) times daily. 09/22/16   Hollice Espy, MD  DULoxetine (CYMBALTA) 20 MG capsule Take 2 capsules (40 mg total) by mouth daily. 08/30/19   Plovsky, Berneta Sages, MD  HYDROcodone-acetaminophen (NORCO) 10-325 MG tablet Take 1 tablet by mouth every 8 (eight) hours as needed for moderate pain or severe pain. Supervising physician; Dr Eliezer Lofts; DEA# [id number] 09/06/19   [name], NP  hydroquinone 4 %  cream 1 APPLICATION TOPICALLY DAILY AS NEEDED FOR BLEMISHES. 05/16/15   [provider]  hydrOXYzine (VISTARIL) 50 MG capsule TAKE 1 CAPSLE BY MOUTH AT NOON, 1 AT 6 PM, AND 1 AS NEEDED 08/30/19   Plovsky, Berneta Sages, MD  lamoTRIgine (LAMICTAL) 150 MG tablet Take 2 tablets (300 mg total) by mouth at bedtime. 08/30/19   Plovsky, Berneta Sages, MD  levalbuterol [facility] HFA) 45 MCG/ACT inhaler Inhale 1-2 puffs into the lungs every 6 (six) hours as needed for wheezing. 08/26/18   [name], NP  levalbuterol (XOPENEX) 1.25 MG/3ML nebulizer solution USE 1 VIAL VIA NEBULIZER EVERY 4 HOURS AS NEEDED FOR WHEEZING OR SHORTNESS OF BREATH 02/21/19   [name], NP  Lysine 1000 MG TABS Take 2,000-4,000 mg by mouth at bedtime. 2000  mg scheduled at bedtime and patient will take 4000 mg if she has Dentist, Historical, MD  Magnesium 500 MG TABS Take 1 tablet by mouth at bedtime.    [provider]  neomycin-bacitracin-polymyxin (NEOSPORIN) [phone] ointment Apply 1 application topically 4 (four) times daily as needed (for cut/scrapes.).    [provider]  pantoprazole (PROTONIX) 40 MG tablet Take 1 tablet (40 mg total) by mouth daily. For heartburn. 05/12/19   [name], NP  phenazopyridine (AZO-TABS) 95 MG tablet Take 95 mg by mouth 3 (three) times daily as needed for pain.    [provider]  polyethylene glycol powder (GLYCOLAX/MIRALAX) powder MIX 17 GRAMS (1 CAPFUL) WITH 4-8 OZ OF LIQUID AND TAKE BY MOUTH TWICE DAILY AS NEEDED 11/24/17   [name], NP  temazepam (RESTORIL) 30 MG capsule TAKE ONE TO TWO CAPSULES BY MOUTH NIGHTLY AT BEDTIME 08/30/19   Plovsky, Berneta Sages, MD  tiZANidine (ZANAFLEX) 4 MG tablet TAKE 2 TABLETS (8 MG TOTAL) BY MOUTH EVERY 6 (SIX) HOURS AS NEEDED FOR MUSCLE SPASMS. 01/12/19   [name], NP  traZODone (DESYREL) 50 MG tablet Take 2 tablets (100 mg total) by mouth at bedtime. 08/30/19   Plovsky, Berneta Sages, MD   triamcinolone cream (KENALOG) 0.1 % Apply 1 application topically 2 (two) times daily. 02/20/19   [name], NP    Family History  Problem Relation Age of Onset  . Cancer Father        Abdomen with mastasis  . Cancer Mother   . Heart disease Mother   . Kidney disease Neg Hx   . Bladder Cancer Neg Hx   . Prostate cancer Neg Hx   . Kidney cancer Neg Hx      Social History   Tobacco Use  . Smoking status: Former Smoker    Years: 25.00    Types: Cigarettes    Quit date: 11/16/2012    Years since quitting: 6.8  . Smokeless tobacco: Never Used  . Tobacco comment: occasional use  Substance Use Topics  . Alcohol use: No    Alcohol/week: 0.0 standard drinks  . Drug use: No    Types: Methylphenidate    Allergies as of 09/07/2019 - Review Complete 08/24/2019  Allergen Reaction Noted  . Albuterol Shortness Of Breath and Other (See Comments) 11/12/2015  . Crestor [rosuvastatin] Other (See Comments) 03/10/2017  . Halcion [triazolam] Other (See Comments) 02/12/2016  . Levaquin [levofloxacin in d5w] Diarrhea and Itching 12/02/2015  . Naproxen sodium Swelling 08/28/2010  . Tylenol [acetaminophen] Swelling 08/15/2013  . Cefaclor Other (See Comments) 08/28/2010  . Diclofenac sodium Other (See Comments) 08/28/2010  . Sulfa antibiotics Itching 05/16/2015  . Tramadol Itching and Nausea And Vomiting 08/18/2016  . Aripiprazole Other (See Comments) 08/28/2010  . Ibuprofen Swelling 08/28/2010    Review of Systems:    All systems reviewed and negative except where noted in HPI. General Appearance:    Alert, cooperative, no distress, appears stated age  Head:    Normocephalic, without obvious abnormality, atraumatic  Eyes:    PERRL, conjunctiva/corneas clear,  Ears:    Grossly normal hearing    Neurologic:   Grossly appears normal     Observations/Objective:  Labs: CBC    Component Value Date/Time   WBC 6.3 08/15/2019 1113   RBC 3.50 (L) 08/15/2019 1113   HGB 11.2 Repeated  and verified X2. (L) 08/15/2019 1113   HCT 33.4 (L) 08/15/2019 1113   PLT 272.0  08/15/2019 1113   MCV 95.4 08/15/2019 1113   MCH 31.7 10/14/2016 0444   MCHC 33.6 08/15/2019 1113   RDW 13.2 08/15/2019 1113   LYMPHSABS 2.1 07/27/2017 1148   MONOABS 0.5 07/27/2017 1148   EOSABS 0.3 07/27/2017 1148   BASOSABS 0.1 07/27/2017 1148   CMP     Component Value Date/Time   NA 138 05/10/2019 1434   K 4.6 05/10/2019 1434   CL 102 05/10/2019 1434   CO2 24 05/10/2019 1434   GLUCOSE 119 (H) 05/10/2019 1434   BUN 20 05/10/2019 1434   CREATININE 0.73 05/10/2019 1434   CALCIUM 9.2 05/10/2019 1434   PROT 6.3 11/24/2018 0826   ALBUMIN 4.0 11/24/2018 0826   AST 10 11/24/2018 0826   ALT 9 11/24/2018 0826   ALKPHOS 64 11/24/2018 0826   BILITOT 0.3 11/24/2018 0826   GFRNONAA >60 10/13/2016 0446   GFRAA >60 10/13/2016 0446    Imaging Studies: No results found.  Assessment and Plan:   Emily Moon is a 58 y.o. y/o female has been referred for dysphagia.  She has a history of multiple sclerosis. Based on her history it is possible that she has transfer dysphagia and it could also be related to her multiple sclerosis.  I do not think that we should proceed with an endoscopy at this point of time as she is having a flare of her multiple sclerosis.  I shall have her get a modified barium swallow as well as a barium swallow with a tablet to evaluate for transfer dysphagia as well as for esophageal dysphagia.  In addition I suggest we treat her empirically for reflux as she has gained weight recently and weighs 215 pounds presently.  I have suggested to continue the PPI taken once a day first thing in the morning before breakfast.  I would suggest we see her back in the office a week or 2 after she has her barium results to discuss the next    I discussed the assessment and treatment plan with the patient. The patient was provided an opportunity to ask questions and all were answered. The patient  agreed with the plan and demonstrated an understanding of the instructions.   The patient was advised to call back or seek an in-person evaluation if the symptoms worsen or if the condition fails to improve as anticipated.    Dr Emily Bellows MD,MRCP [facility]) Gastroenterology/Hepatology Pager: [phone]   Speech recognition software was used to dictate the above note.

## 2019-09-12 ENCOUNTER — Telehealth: Payer: Self-pay

## 2019-09-12 NOTE — Telephone Encounter (Signed)
Called pt to discuss scheduling barium swallow ordered by Dr. Vicente Males during pt's last visit.  Unable to contact, LVM to return call

## 2019-09-29 NOTE — Telephone Encounter (Signed)
Spoke with pt and was able to schedule both barium swallow orders. Pt is aware of appointment information.

## 2019-09-30 ENCOUNTER — Other Ambulatory Visit (HOSPITAL_COMMUNITY): Payer: Self-pay | Admitting: Psychiatry

## 2019-10-05 DIAGNOSIS — B001 Herpesviral vesicular dermatitis: Secondary | ICD-10-CM

## 2019-10-06 MED ORDER — VALACYCLOVIR HCL 1 G PO TABS: ORAL_TABLET | ORAL | 0 refills | Status: DC

## 2019-10-17 ENCOUNTER — Telehealth: Payer: Self-pay | Admitting: Internal Medicine

## 2019-10-17 DIAGNOSIS — G35 Multiple sclerosis: Secondary | ICD-10-CM | POA: Diagnosis not present

## 2019-10-17 DIAGNOSIS — G4733 Obstructive sleep apnea (adult) (pediatric): Secondary | ICD-10-CM

## 2019-10-17 NOTE — Telephone Encounter (Signed)
Called and spoke to pt, who states that she returned cpap due to being claustrophobic.  Pt would like new referral to [facility] for oral device.  Referral was originally placed in 12/05/2018, however new referral is needed.   Referral has been placed based off of last OV note.  Nothing further is needed.     Patient Instructions by Flora Lipps, MD at 11/21/2018 10:45 AM Author: Flora Lipps, MD Author Type: Physician Filed: 11/21/2018 11:13 AM  Note Status: Signed Cosign: Cosign Not Required Encounter Date: 11/21/2018  Editor: Flora Lipps, MD (Physician)    Recommend [facility] to assess for Oral Device

## 2019-10-17 NOTE — Telephone Encounter (Signed)
Pt turned in cpap machine after two months because it was making her claustrophobic. Pt states that they were going to go with dental appliance for sleep apnea, but she said she is wanting to make sure that she needs to call [facility] to get the Oral device. Pt wants Dr. Mortimer Fries to know that she quit smoking, vaping, etc. Nothing is going into her lungs except for oxygen.

## 2019-10-18 ENCOUNTER — Ambulatory Visit: Payer: PPO

## 2019-10-18 ENCOUNTER — Other Ambulatory Visit: Payer: Self-pay | Admitting: Internal Medicine

## 2019-10-18 DIAGNOSIS — G4733 Obstructive sleep apnea (adult) (pediatric): Secondary | ICD-10-CM

## 2019-10-25 DIAGNOSIS — M62838 Other muscle spasm: Secondary | ICD-10-CM

## 2019-10-25 MED ORDER — TIZANIDINE HCL 4 MG PO TABS: 8.0000 mg | ORAL_TABLET | Freq: Four times a day (QID) | ORAL | 0 refills | Status: DC | PRN

## 2019-10-27 ENCOUNTER — Other Ambulatory Visit: Payer: Self-pay

## 2019-10-27 ENCOUNTER — Ambulatory Visit (INDEPENDENT_AMBULATORY_CARE_PROVIDER_SITE_OTHER): Payer: PPO | Admitting: Primary Care

## 2019-10-27 ENCOUNTER — Encounter: Payer: Self-pay | Admitting: Primary Care

## 2019-10-27 VITALS — Wt 222.0 lb

## 2019-10-27 DIAGNOSIS — R21 Rash and other nonspecific skin eruption: Secondary | ICD-10-CM | POA: Diagnosis not present

## 2019-10-27 MED ORDER — CICLOPIROX OLAMINE 0.77 % EX CREA: TOPICAL_CREAM | Freq: Two times a day (BID) | CUTANEOUS | 0 refills | Status: DC

## 2019-10-27 NOTE — Patient Instructions (Signed)
Start ciclopirox cream daily as discussed.  Please update me in one week.  It was a pleasure to see you today! Allie Bossier, NP-C

## 2019-10-27 NOTE — Progress Notes (Signed)
Subjective:    Patient ID: Emily Moon, female    DOB: May 30, 1961, 58 y.o.   MRN: [id number]  HPI  Virtual Visit via Video Note  I connected with Emily Moon on 10/27/19 at  2:00 PM EST by a video enabled telemedicine application and verified that I am speaking with the correct person using two identifiers.  Location: Patient: Home Provider: Office   I discussed the limitations of evaluation and management by telemedicine and the availability of in person appointments. The patient expressed understanding and agreed to proceed.  History of Present Illness:  Ms. Cousens is a 58 year old female with a history of bipolar disorder, osteoarthritis, insomnia, tobacco abuse, multiple sclerosis, prediabetes, uncontrolled hyperlipidemia who presents today with a chief complaint of rash.  Her rash is located to the right dorsal hand for which she initially noticed one week ago. Her rash is itchy, sometimes with discomfort. She's applied topical ketoconazole, OTC foot fungus without improvement.    Observations/Objective:  Alert and oriented. Appears well, not sickly. No distress. Speaking in complete sentences. Circular ring appearance rash to right dorsal hand approx 1 cm in diameter.  Assessment and Plan:  Rash appears fugal in etiology, but not responding to ketoconazole. Rx for ciclopirox sent to pharmacy to see if she has better response. This doesn't appear to be contact dermatitis, scabies, cellulitis, allergic dermatitis.  She will update in one week.   Follow Up Instructions:  Start ciclopirox cream daily as discussed.  Please update me in one week.  It was a pleasure to see you today! Allie Bossier, NP-C    I discussed the assessment and treatment plan with the patient. The patient was provided an opportunity to ask questions and all were answered. The patient agreed with the plan and demonstrated an understanding of the instructions.   The patient was  advised to call back or seek an in-person evaluation if the symptoms worsen or if the condition fails to improve as anticipated.    [name], NP    Review of Systems  Skin: Positive for rash.       Past Medical History:  Diagnosis Date  . Arthritis    osteo  . Asthma   . Bipolar disorder ([place]) 05/21/14  . Cataracts, bilateral   . DDD (degenerative disc disease), cervical    also back  . Depression   . Dizziness    Positional  . Edema    feet/legs  . Fibromyalgia syndrome   . Fungal infection    Finger nails  . GERD (gastroesophageal reflux disease)   . Gout   . Headache    seasonal allergies  . Heart palpitations   . 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 Hypercholesterolemia   . Multiple sclerosis (HCC)    weakness  . Nephrolithiasis    kidney stones  . Osteoporosis    osteoarthritis  . Pneumonia   . PONV (postoperative nausea and vomiting)    no problem after cataract surgery  . Psoriasis   . Renal stone   . Shortness of breath dyspnea    wheezing  . Sleep apnea 2012   sleep study / slight, no interventions  . Urinary frequency      Social History   Socioeconomic History  . Marital status: Divorced    Spouse name: Not on file  . Number of children: 1  . Years of education: Not on file  . Highest education level: Not on file  Occupational  History  . 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 Smoking status: Former Smoker    Years: 25.00    Types: Cigarettes    Quit date: 11/16/2012    Years since quitting: 6.9  . Smokeless tobacco: Never Used  . Tobacco comment: occasional use  Substance and Sexual Activity  . Alcohol use: No    Alcohol/week: 0.0 standard drinks  . Drug use: No    Types: Methylphenidate  . Sexual activity: Never  Other Topics Concern  . Not on file  Social History Narrative  . Not on file   Social Determinants of Health   Financial Resource Strain:   . Difficulty of  Paying Living Expenses: Not on file  Food Insecurity:   . Worried About Charity fundraiser in the Last Year: Not on file  . Ran Out of Food in the Last Year: Not on file  Transportation Needs:   . Lack of Transportation (Medical): Not on file  . Lack of Transportation (Non-Medical): Not on file  Physical Activity:   . Days of Exercise per Week: Not on file  . Minutes of Exercise per Session: Not on file  Stress:   . Feeling of Stress : Not on file  Social Connections:   . Frequency of Communication with Friends and Family: Not on file  . Frequency of Social Gatherings with Friends and Family: Not on file  . Attends Religious Services: Not on file  . Active Member of Clubs or Organizations: Not on file  . Attends Archivist Meetings: Not on file  . Marital Status: Not on file  Intimate Partner Violence:   . Fear of Current or Ex-Partner: Not on file  . Emotionally Abused: Not on file  . Physically Abused: Not on file  . Sexually Abused: Not on file    Past Surgical History:  Procedure Laterality Date  . CATARACT EXTRACTION W/PHACO Left 05/21/2015   Procedure: CATARACT EXTRACTION PHACO AND INTRAOCULAR LENS PLACEMENT (IOC);  Surgeon: Birder Robson, MD;  Location: [facility];  Service: Ophthalmology;  Laterality: Left;  [place] 00:35 AP% 22.9 CDE 8.11 fluid pack lot [id number]  . CATARACT EXTRACTION W/PHACO Right 06/04/2015   Procedure: CATARACT EXTRACTION PHACO AND INTRAOCULAR LENS PLACEMENT (IOC);  Surgeon: Birder Robson, MD;  Location: [facility];  Service: Ophthalmology;  Laterality: Right;  US:00:48 AP%: 10.5 CDE:5.08 Fluid lot [id number]  . CYSTOSCOPY/URETEROSCOPY/HOLMIUM LASER/STENT PLACEMENT Bilateral 09/22/2016   Procedure: CYSTOSCOPY/URETEROSCOPY/HOLMIUM LASER/STENT PLACEMENT;  Surgeon: Hollice Espy, MD;  Location: [facility];  Service: Urology;  Laterality: Bilateral;  . EYE SURGERY  2015   tissue biopsy  . FOOT SURGERY  2015  . JOINT REPLACEMENT Left 2013   hip  replacement  . LITHOTRIPSY    . PTOSIS REPAIR Bilateral 02/18/2016   Procedure: BILATERAL PTOSIS REPAIR UPPER EYELIDS;  Surgeon: Karle Starch, MD;  Location: [place];  Service: Ophthalmology;  Laterality: Bilateral;  LEAVE PT EARLY AM  . thumb surgery Right   . TONSILLECTOMY  1973    Family History  Problem Relation Age of Onset  . Cancer Father        Abdomen with mastasis  . Cancer Mother   . Heart disease Mother   . Kidney disease Neg Hx   . Bladder Cancer Neg Hx   . Prostate cancer Neg Hx   . Kidney cancer Neg Hx     Allergies  Allergen Reactions  . Albuterol Shortness  Of Breath and Other (See Comments)    Makes pt feel jittery/ tacycardic  . Crestor [Rosuvastatin] Other (See Comments)    Joint pain, muscle pain, and hair loss  . Halcion [Triazolam] Other (See Comments)    Dizziness,headaches,bladder problems  . Levaquin [Levofloxacin In D5w] Diarrhea and Itching    Shoulder pain  . Naproxen Sodium Swelling    Patient tolerates in small doses  . Tylenol [Acetaminophen] Swelling    Patient tolerates in small doses  . Cefaclor Other (See Comments)    Doesn't remember---unsure if actually allergic   . Diclofenac Sodium Other (See Comments)    "made very sick"  . Sulfa Antibiotics Itching    Unsure of reaction possibly itching  . Tramadol Itching and Nausea And Vomiting  . Aripiprazole Other (See Comments)    Muscle tension/cramping  . Ibuprofen Swelling    Patient tolerates in small doses    Current Outpatient Medications on File Prior to Visit  Medication Sig Dispense Refill  . ASPERCREME LIDOCAINE EX Apply 1 application topically 4 (four) times daily as needed (for pain.).     Marland Kitchen aspirin 325 MG tablet Take 325 mg by mouth every 4 (four) hours as needed for headache.     Marland Kitchen atorvastatin (LIPITOR) 20 MG tablet TAKE 1 TABLET BY MOUTH EVERY DAY 90 tablet 0  . Azelastine HCl 137 MCG/SPRAY SOLN PLACE 2 SPRAYS INTO BOTH NOSTRILS 2 (TWO) TIMES DAILY 30 mL 2  .  BIOTIN PO Take 200 mg by mouth daily.     . calcipotriene-betamethasone (TACLONEX) ointment Apply 1 application topically daily as needed (for psorasis).     . Cholecalciferol (VITAMIN D3) 10000 units capsule Take 30,000 Units by mouth daily.     . clobetasol cream (TEMOVATE) [id number] % Apply 1 application topically 2 (two) times daily. 30 g 2  . cyanocobalamin (,VITAMIN B-12,) 1000 MCG/ML injection Inject 1 mL (1,000 mcg total) into the muscle every 3 (three) months. 1 mL 0  . diclofenac sodium (VOLTAREN) 1 % GEL Apply 4 g topically 4 (four) times daily. 500 g 5  . docusate sodium (COLACE) 100 MG capsule Take 1 capsule (100 mg total) by mouth 2 (two) times daily. 60 capsule 0  . DULoxetine (CYMBALTA) 20 MG capsule Take 2 capsules (40 mg total) by mouth daily. 60 capsule 5  . HYDROcodone-acetaminophen (NORCO) 10-325 MG tablet Take 1 tablet by mouth every 8 (eight) hours as needed for moderate pain or severe pain. Supervising physician; Dr Eliezer Lofts; DEA# [id number] 90 tablet 0  . hydroquinone 4 % cream 1 APPLICATION TOPICALLY DAILY AS NEEDED FOR BLEMISHES.  3  . hydrOXYzine (VISTARIL) 50 MG capsule TAKE 1 CAPSLE BY MOUTH AT NOON, 1 AT 6 PM, AND 1 AS NEEDED 270 capsule 2  . lamoTRIgine (LAMICTAL) 150 MG tablet Take 2 tablets (300 mg total) by mouth at bedtime. 60 tablet 5  . levalbuterol (XOPENEX HFA) 45 MCG/ACT inhaler Inhale 1-2 puffs into the lungs every 6 (six) hours as needed for wheezing. 1 Inhaler 0  . levalbuterol (XOPENEX) 1.25 MG/3ML nebulizer solution USE 1 VIAL VIA NEBULIZER EVERY 4 HOURS AS NEEDED FOR WHEEZING OR SHORTNESS OF BREATH 270 mL 1  . Lysine 1000 MG TABS Take 2,000-4,000 mg by mouth at bedtime. 2000 mg scheduled at bedtime and patient will take 4000 mg if she has outbreak    . Magnesium 500 MG TABS Take 1 tablet by mouth at bedtime.    Marland Kitchen neomycin-bacitracin-polymyxin (NEOSPORIN) [phone] ointment Apply  1 application topically 4 (four) times daily as needed (for cut/scrapes.).      Marland Kitchen pantoprazole (PROTONIX) 40 MG tablet Take 1 tablet (40 mg total) by mouth daily. For heartburn. 90 tablet 1  . phenazopyridine (AZO-TABS) 95 MG tablet Take 95 mg by mouth 3 (three) times daily as needed for pain.    . polyethylene glycol powder (GLYCOLAX/MIRALAX) powder MIX 17 GRAMS (1 CAPFUL) WITH 4-8 OZ OF LIQUID AND TAKE BY MOUTH TWICE DAILY AS NEEDED 527 g 0  . temazepam (RESTORIL) 30 MG capsule TAKE ONE TO TWO CAPSULES BY MOUTH NIGHTLY AT BEDTIME 60 capsule 4  . tiZANidine (ZANAFLEX) 4 MG tablet Take 2 tablets (8 mg total) by mouth every 6 (six) hours as needed for muscle spasms. 240 tablet 0  . traZODone (DESYREL) 50 MG tablet Take 2 tablets (100 mg total) by mouth at bedtime. 60 tablet 7  . triamcinolone cream (KENALOG) 0.1 % Apply 1 application topically 2 (two) times daily. 30 g 0  . valACYclovir (VALTREX) 1000 MG tablet Take 2 tablets by mouth twice daily for one day. 4 tablet 0   No current facility-administered medications on file prior to visit.    Wt 222 lb (100.7 kg)   LMP 08/23/2014   SpO2 99%   BMI 32.78 kg/m    Objective:   Physical Exam  Constitutional: She appears well-nourished.  Respiratory: Effort normal.  Skin: Rash noted.  Circular ring appearance rash to right dorsal hand approx 1 cm in diameter.  Psychiatric: She has a normal mood and affect.           Assessment & Plan:

## 2019-11-01 ENCOUNTER — Ambulatory Visit
Admission: RE | Admit: 2019-11-01 | Discharge: 2019-11-01 | Disposition: A | Discharge status: Home / Self Care | Payer: PPO | Source: Ambulatory Visit | Attending: Gastroenterology | Admitting: Gastroenterology

## 2019-11-01 ENCOUNTER — Other Ambulatory Visit: Payer: Self-pay

## 2019-11-01 DIAGNOSIS — G35 Multiple sclerosis: Secondary | ICD-10-CM | POA: Diagnosis not present

## 2019-11-01 DIAGNOSIS — R1312 Dysphagia, oropharyngeal phase: Secondary | ICD-10-CM | POA: Insufficient documentation

## 2019-11-01 DIAGNOSIS — R1310 Dysphagia, unspecified: Secondary | ICD-10-CM | POA: Diagnosis not present

## 2019-11-01 IMAGING — RF DG ESOPHAGUS
9 of 10 series · 14 of 22 positions shown · non-contrast
Comparison: None.

CLINICAL DATA: Dysphagia

EXAM:
ESOPHOGRAM / BARIUM SWALLOW / BARIUM TABLET STUDY
TECHNIQUE: Combined double contrast and single contrast examination performed
using effervescent crystals, thick barium liquid, and thin barium
liquid. The patient was observed with fluoroscopy swallowing a 13 mm
barium sulphate tablet.
FLUOROSCOPY TIME:  Fluoroscopy Time:  0.7 minute
Radiation Exposure Index (if provided by the fluoroscopic device):
7.4 mGy
Number of Acquired Spot Images: 0

[Series 2: cp_standard · 0.26mm/px · 3 of 64 frames shown (1 of 9)]
[frame 10/64]
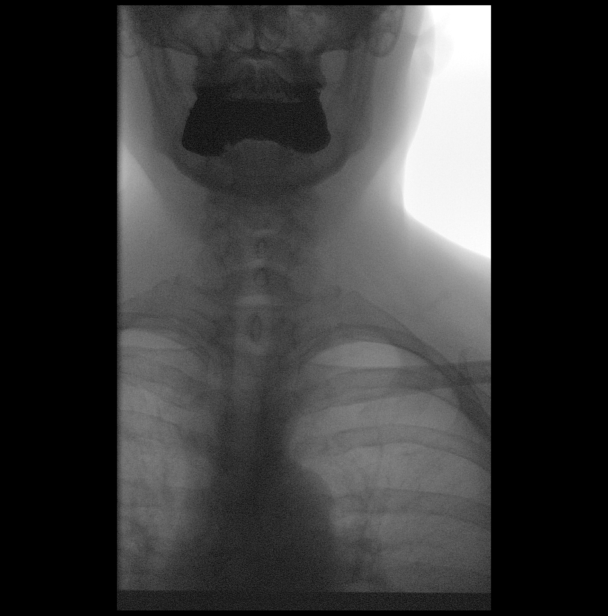
[frame 33/64]
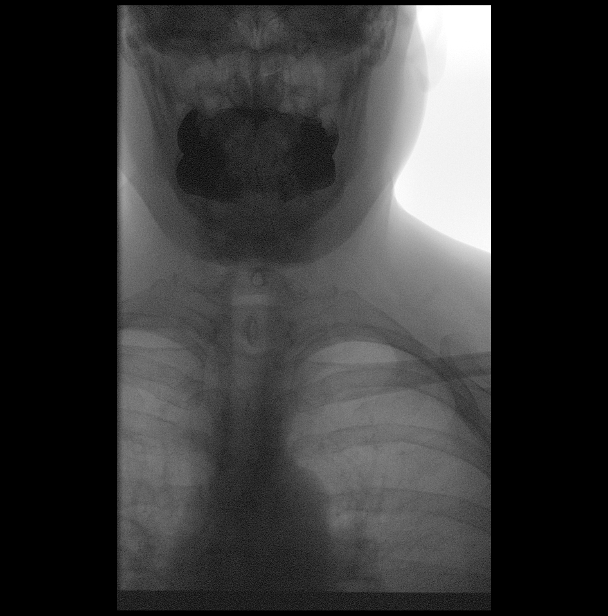
[frame 55/64]
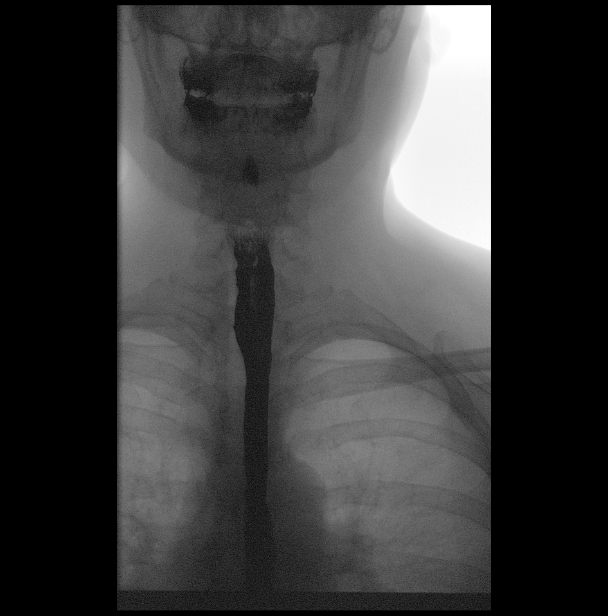

[Series 3: cp_standard · 0.25mm/px · 2 of 44 frames shown (2 of 9)]
[frame 7/44]
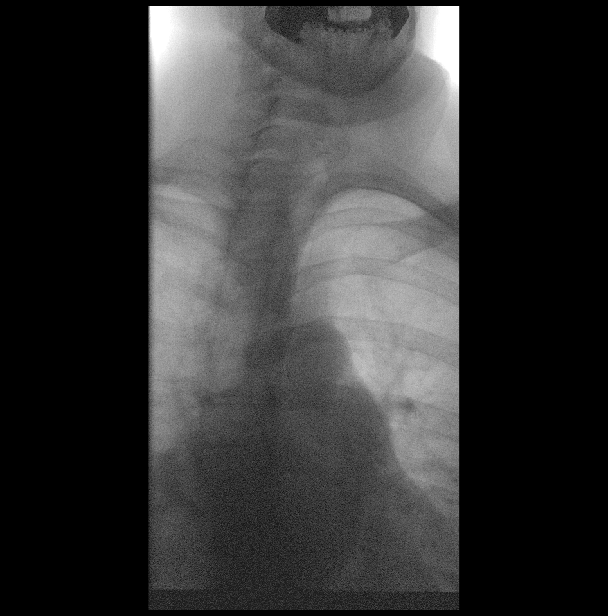
[frame 38/44]
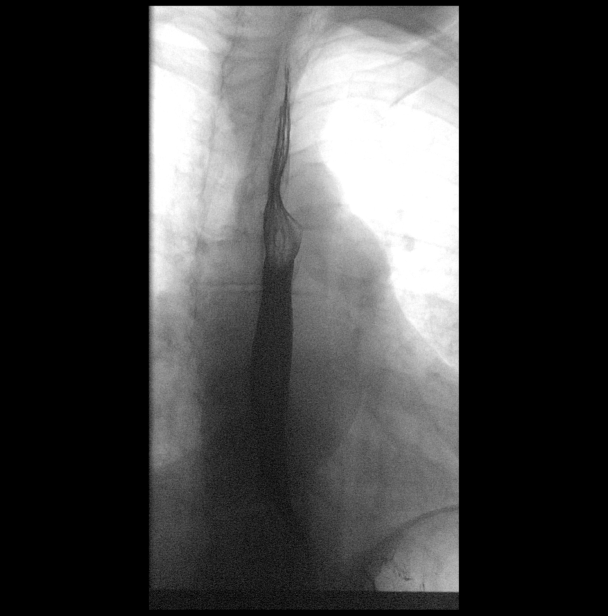

[Series 4: cp_standard · 0.25mm/px · 1 of 1 slices shown (3 of 9)]
[im 1/1]
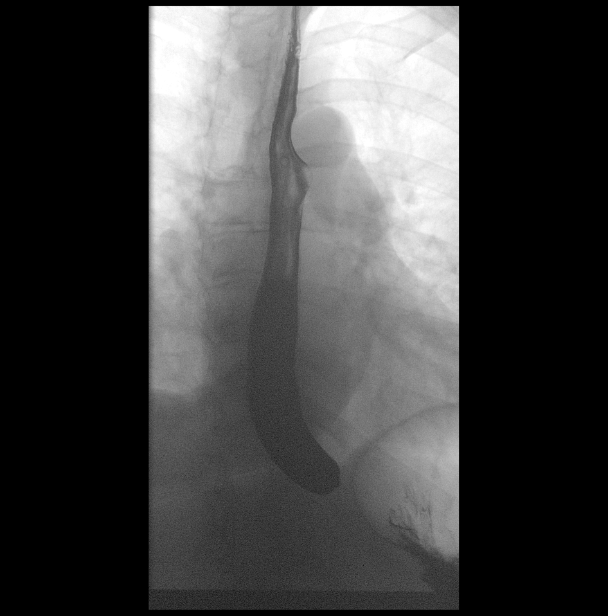

[Series 5: cp_standard · 0.25mm/px · 2 of 12 frames shown (4 of 9)]
[frame 7/12]
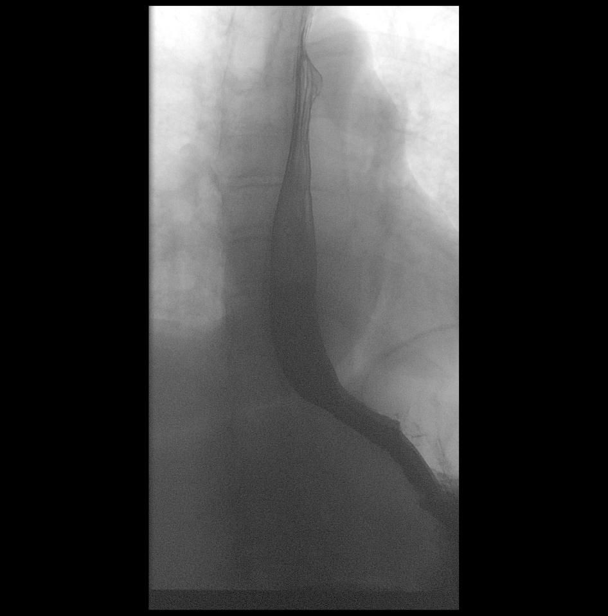
[frame 11/12]
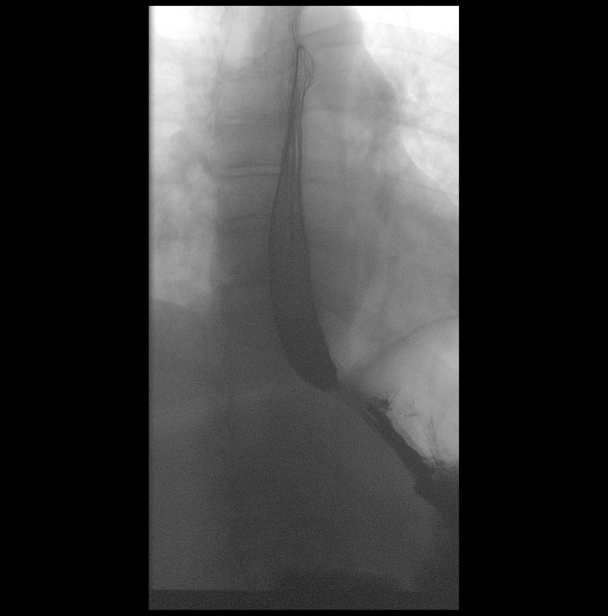

[Series 6: cp_standard · 0.25mm/px · 1 of 1 slices shown (5 of 9)]
[im 1/1]
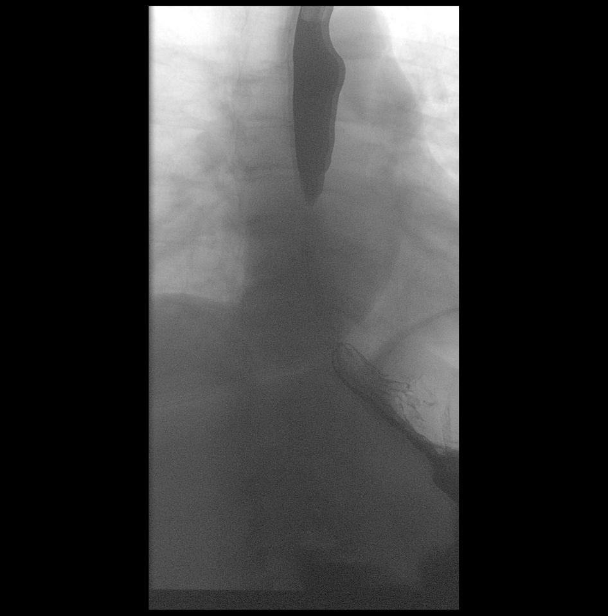

[Series 7: cp_standard · 0.28mm/px · 1 of 1 slices shown (6 of 9)]
[im 1/1]
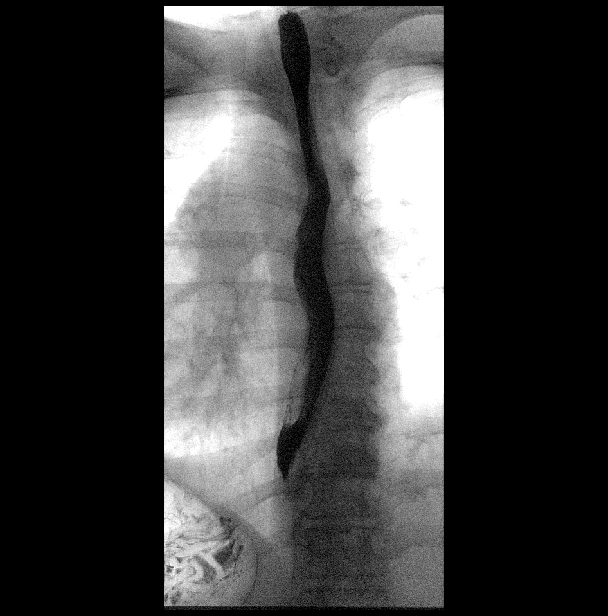

[Series 9: cp_standard · 0.28mm/px · 1 of 1 slices shown (7 of 9)]
[im 1/1]
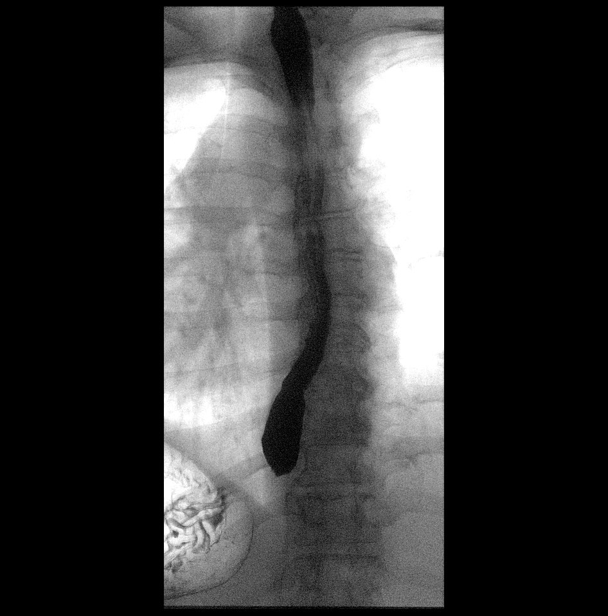

[Series 10: cp_standard · 0.28mm/px · 2 of 21 frames shown (8 of 9)]
[frame 4/21]
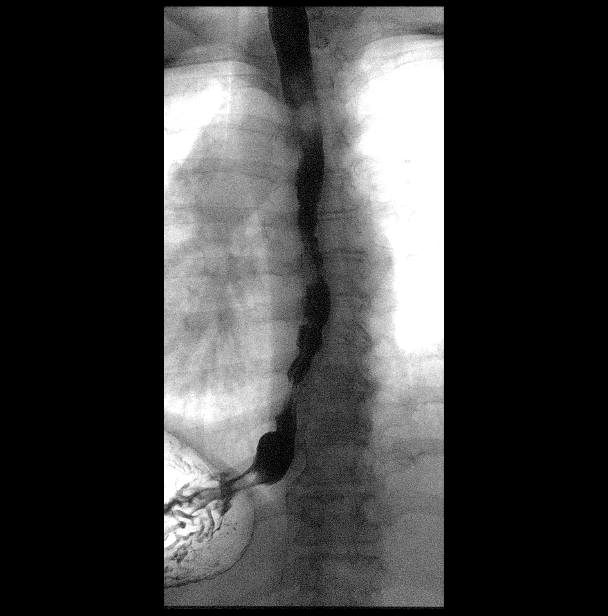
[frame 11/21]
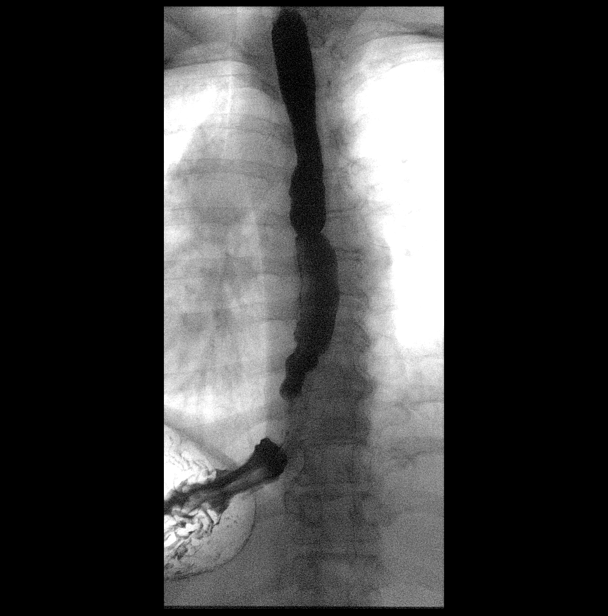

[Series 11: cp_standard · 0.28mm/px · 1 of 1 slices shown (9 of 9)]
[im 1/1]
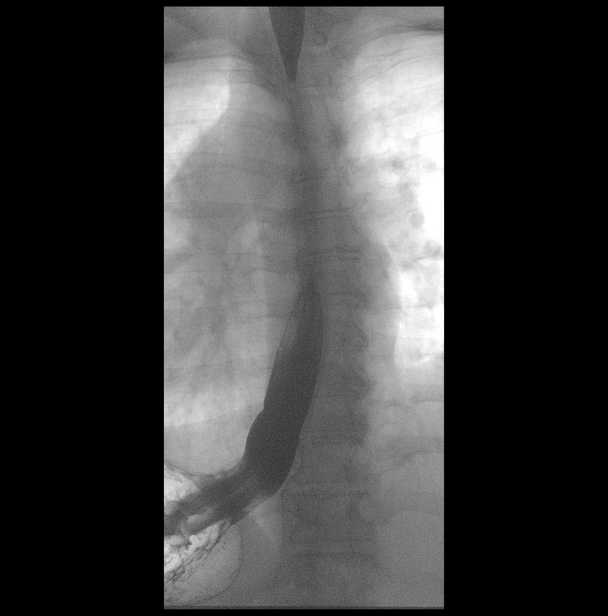

[14 of 22 positions shown; findings below may reference images not displayed]

FINDINGS: Normal pharyngeal anatomy and motility. Contrast flowed freely
through the esophagus without evidence of a stricture or mass.
Normal esophageal mucosa without evidence of irregularity or
ulceration. Single episode of distal esophageal spasm. No evidence
of reflux. No definite hiatal hernia was demonstrated.

At the end of the examination a 13 mm barium tablet was administered
which transited through the esophagus and esophagogastric junction
without delay.
IMPRESSION: 1. Single episode of distal esophageal spasm. Otherwise normal
barium swallow.

## 2019-11-01 IMAGING — RF DG SWALLOWING FUNCTION
1 series · 4 of 4 positions shown · non-contrast
Comparison: None.

CLINICAL DATA: Dysphagia. Transfer dysphagia.  Multiple sclerosis.

EXAM:
MODIFIED BARIUM SWALLOW
TECHNIQUE: Different consistencies of barium were administered orally to the
patient by the Speech Pathologist. Imaging of the pharynx was
performed in the lateral projection. The radiologist was present in
the fluoroscopy room for this study, providing personal supervision.
FLUOROSCOPY TIME:  Fluoroscopy Time:  0.7 minute
Radiation Exposure Index (if provided by the fluoroscopic device): 2
mGy
Number of Acquired Spot Images: 0

[Series 1: cp_standard · 0.25mm/px · 4 of 162 frames shown]
[frame 25/162]
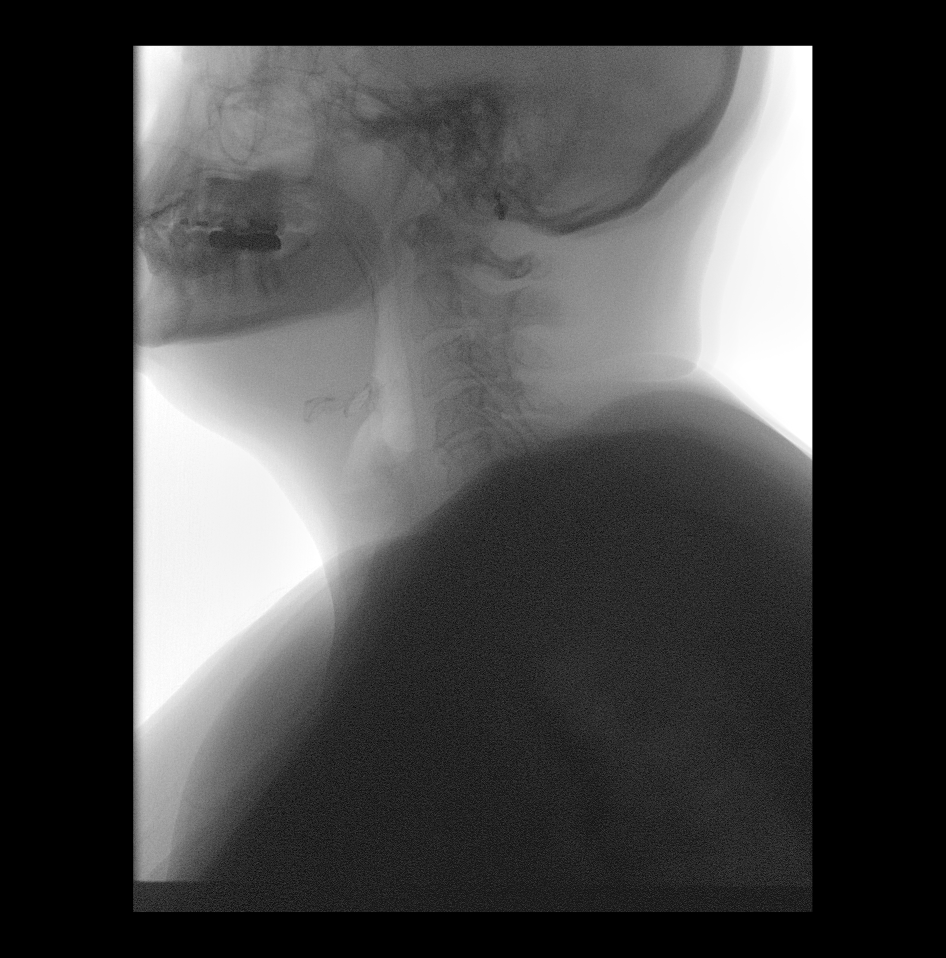
[frame 82/162]
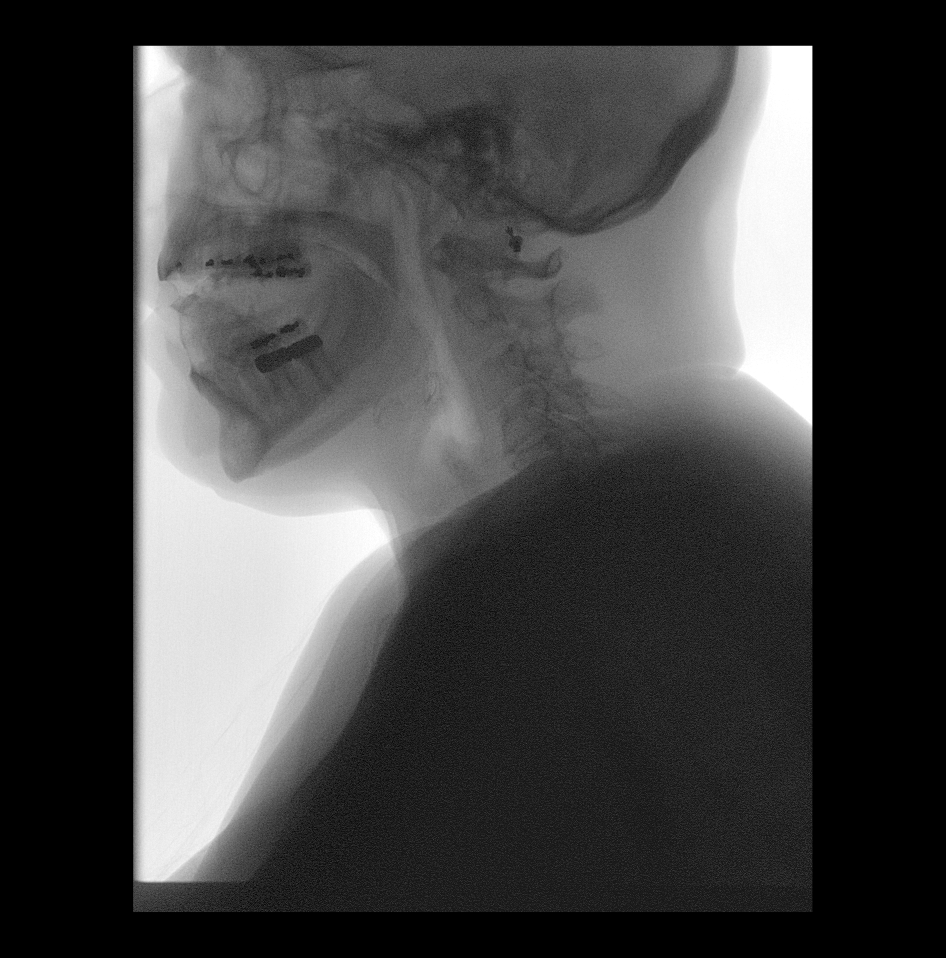
[frame 138/162]
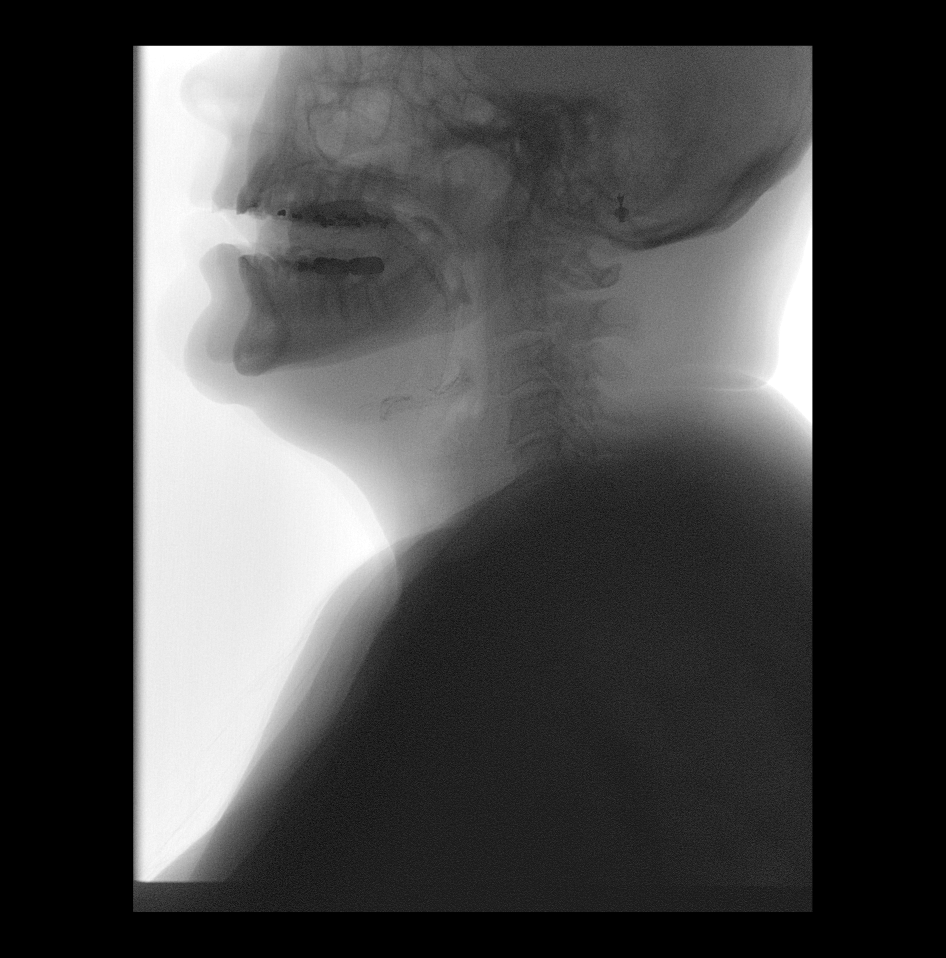
[frame 141/162]
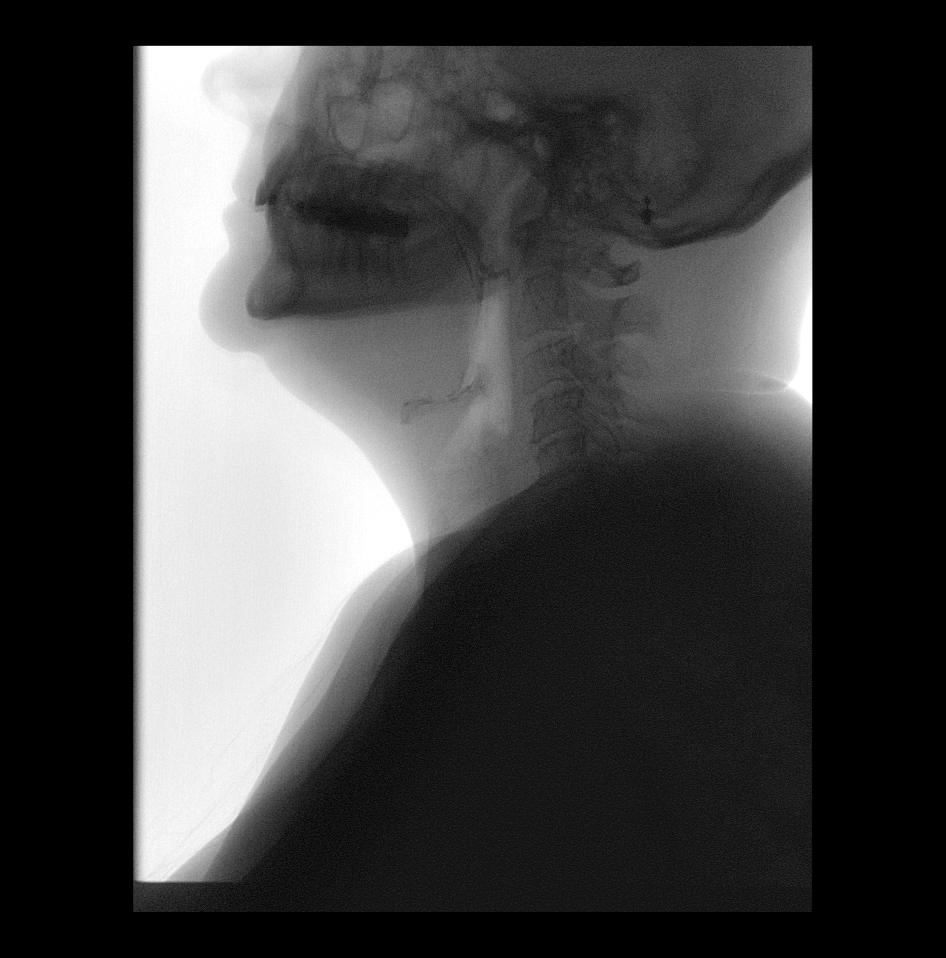

[4 of 4 positions shown; findings below may reference images not displayed]

FINDINGS: Real-time fluoroscopy was performed with a speech pathologist
present for evaluation of the swallow function. Multiple
consistencies of barium were administered which included water,
nectar, applesauce and cracker consistencies. Oropharyngeal
transfer. Flash laryngeal penetration without tracheal aspiration
with thin liquid. Otherwise normal examination.
IMPRESSION: Modified barium swallow as detailed above.

Please refer to the Speech Pathologists report for complete details
and recommendations.

## 2019-11-01 NOTE — Therapy (Signed)
[place], [place], [address] Phone: [phone]   Fax:     Modified Barium Swallow  Patient Details  Name: Emily Moon MRN: [id number] Date of Birth: 1961/03/14 No data recorded  Encounter Date: 11/01/2019  End of Session - 11/01/19 1306    Visit Number  1    Number of Visits  1    Date for SLP Re-Evaluation  11/01/19       Past Medical History:  Diagnosis Date  . Arthritis    osteo  . Asthma   . Bipolar disorder ([place]) 05/21/14  . Cataracts, bilateral   . DDD (degenerative disc disease), cervical    also back  . Depression   . Dizziness    Positional  . Edema    feet/legs  . Fibromyalgia syndrome   . Fungal infection    Finger nails  . GERD (gastroesophageal reflux disease)   . Gout   . Headache    seasonal allergies  . Heart palpitations   . 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 Hypercholesterolemia   . Multiple sclerosis (HCC)    weakness  . Nephrolithiasis    kidney stones  . Osteoporosis    osteoarthritis  . Pneumonia   . PONV (postoperative nausea and vomiting)    no problem after cataract surgery  . Psoriasis   . Renal stone   . Shortness of breath dyspnea    wheezing  . Sleep apnea 2012   sleep study / slight, no interventions  . Urinary frequency     Past Surgical History:  Procedure Laterality Date  . CATARACT EXTRACTION W/PHACO Left 05/21/2015   Procedure: CATARACT EXTRACTION PHACO AND INTRAOCULAR LENS PLACEMENT (IOC);  Surgeon: Birder Robson, MD;  Location: [facility];  Service: Ophthalmology;  Laterality: Left;  [place] 00:35 AP% 22.9 CDE 8.11 fluid pack lot [id number]  . CATARACT EXTRACTION W/PHACO Right 06/04/2015   Procedure: CATARACT EXTRACTION PHACO AND INTRAOCULAR LENS PLACEMENT (IOC);  Surgeon: Birder Robson, MD;  Location: [facility];  Service: Ophthalmology;  Laterality: Right;  US:00:48 AP%: 10.5 CDE:5.08 Fluid lot [id number]  .  CYSTOSCOPY/URETEROSCOPY/HOLMIUM LASER/STENT PLACEMENT Bilateral 09/22/2016   Procedure: CYSTOSCOPY/URETEROSCOPY/HOLMIUM LASER/STENT PLACEMENT;  Surgeon: Hollice Espy, MD;  Location: [facility];  Service: Urology;  Laterality: Bilateral;  . EYE SURGERY  2015   tissue biopsy  . FOOT SURGERY  2015  . JOINT REPLACEMENT Left 2013   hip replacement  . LITHOTRIPSY    . PTOSIS REPAIR Bilateral 02/18/2016   Procedure: BILATERAL PTOSIS REPAIR UPPER EYELIDS;  Surgeon: Karle Starch, MD;  Location: [place];  Service: Ophthalmology;  Laterality: Bilateral;  LEAVE PT EARLY AM  . thumb surgery Right   . TONSILLECTOMY  1973    There were no vitals filed for this visit.      Subjective: Patient behavior: (alertness, ability to follow instructions, etc.):  The patient is alert, able to express her swallowing complaints, and follow directions.  The patient presents with mild dysarthria and fully intelligible speech.   Chief complaint: Patient reports some difficulty "pushing the food to the back of her throat"   Objective:  Radiological Procedure: A videoflouroscopic evaluation of oral-preparatory, reflex initiation, and pharyngeal phases of the swallow was performed; as well as a screening of the upper esophageal phase.  I. POSTURE: Upright in MBS chair  II. VIEW: Lateral  III. COMPENSATORY STRATEGIES: N/A  IV. BOLUSES ADMINISTERED:   Thin Liquid: 1 small,  4 rapid  consecutive   Nectar-thick Liquid: 1 moderate   Honey-thick Liquid: DNT   Puree: 1 self-administered bolus   Mechanical Soft: 1/4 graham cracker in applesauce  V. RESULTS OF EVALUATION: A. ORAL PREPARATORY PHASE: (The lips, tongue, and velum are observed for strength and coordination)       **Overall Severity Rating: Mild; mild disorganization with mildly prolonged posterior transfer  B. SWALLOW INITIATION/REFLEX: (The reflex is normal if "triggered" by the time the bolus reached the base of the tongue)  **Overall  Severity Rating: within normal limits  C. PHARYNGEAL PHASE: (Pharyngeal function is normal if the bolus shows rapid, smooth, and continuous transit through the pharynx and there is no pharyngeal residue after the swallow)  **Overall Severity Rating: within normal limits  D. LARYNGEAL PENETRATION: (Material entering into the laryngeal inlet/vestibule but not aspirated) TRANSIENT penetration with thin liquids  E. ASPIRATION: None  F. ESOPHAGEAL PHASE: (Screening of the upper esophagus) See barium swallow study performed this date  ASSESSMENT: This 58 year old woman; with mild dysarthria secondary multiple sclerosis; is presenting with minimal oropharyngeal dysphagia characterized by minimally disorganized / prolonged A-P transfer and transient laryngeal penetration.  Timing of pharyngeal swallow initiation is within functional limits.   Aspects of the pharyngeal stage of swallowing including tongue base retraction, hyolaryngeal excursion, epiglottic inversion, and duration/amplitude of UES opening are within normal limits.  There is no observed pharyngeal residue or tracheal aspiration.  The patient demonstrates functional and safe oropharyngeal swallowing.   PLAN/RECOMMENDATIONS:   A. Diet: Regular   B. Swallowing Precautions: No special oropharyngeal precautions indicated   C. Recommended consultation to: follow up with MDs as recommended   D. Therapy recommendations: speech therapy is not indicated for speech or swallowing   E. Results and recommendations were discussed with the patient immediatly following the study and the final report routed to the referring MD.  Oropharyngeal dysphagia - Plan: DG SWALLOW FUNC OP MEDICARE SPEECH PATH, DG SWALLOW FUNC OP MEDICARE SPEECH PATH        Problem List Patient Active Problem List   Diagnosis Date Noted  . Rash and nonspecific skin eruption 02/20/2019  . Asthma 02/10/2019  . Epigastric pain 12/21/2018  . OSA (obstructive sleep apnea)  12/02/2018  . Joint swelling 10/24/2018  . Environmental and seasonal allergies 10/24/2018  . Greater trochanteric bursitis of right hip 09/28/2018  . Acute pain of right shoulder 09/28/2018  . Prediabetes 07/22/2018  . Bipolar I disorder, most recent episode (or current) manic ([place]) 05/20/2018  . Vitamin D deficiency 02/15/2018  . Preventative health care 11/24/2017  . Urinary frequency 03/30/2017  . B12 deficiency 05/22/2016  . Dizziness 03/16/2016  . Medicare annual wellness visit, subsequent 10/25/2015  . Back pain 10/31/2014  . Weight gain 06/21/2014  . Bipolar disorder ([place]) 05/21/2014  . 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 Insomnia 09/01/2011  . Obesity 05/04/2011  . HLD (hyperlipidemia) 01/20/2011  . Tobacco abuse 01/20/2011  . Chest pain 01/20/2011  . Chronic pain syndrome 01/07/2011  . RENAL CALCULUS, RECURRENT 11/13/2010  . MULTIPLE SCLEROSIS, PROGRESSIVE/RELAPSING 08/28/2010  . GERD 08/28/2010  . Osteoarthritis 08/28/2010  . NEPHROLITHIASIS, HX OF 08/28/2010   Leroy Sea, MS/CCC- SLP  Lou Miner 11/01/2019, 1:07 PM  [facility] [place], [place], [address] Phone: [phone]   Fax:     Name: Emily Moon MRN: [id number] Date of  Birth: February 27, 1961

## 2019-11-02 ENCOUNTER — Ambulatory Visit (INDEPENDENT_AMBULATORY_CARE_PROVIDER_SITE_OTHER): Payer: PPO | Admitting: Primary Care

## 2019-11-02 ENCOUNTER — Encounter: Payer: Self-pay | Admitting: Primary Care

## 2019-11-02 VITALS — BP 118/76 | HR 105 | Temp 96.1°F | Ht 69.0 in | Wt 227.2 lb

## 2019-11-02 DIAGNOSIS — L409 Psoriasis, unspecified: Secondary | ICD-10-CM | POA: Insufficient documentation

## 2019-11-02 DIAGNOSIS — M1990 Unspecified osteoarthritis, unspecified site: Secondary | ICD-10-CM

## 2019-11-02 DIAGNOSIS — R21 Rash and other nonspecific skin eruption: Secondary | ICD-10-CM | POA: Diagnosis not present

## 2019-11-02 MED ORDER — TRIAMCINOLONE ACETONIDE 0.5 % EX OINT: 1.0000 "application " | TOPICAL_OINTMENT | Freq: Two times a day (BID) | CUTANEOUS | 0 refills | Status: DC

## 2019-11-02 MED ORDER — HYDROCODONE-ACETAMINOPHEN 10-325 MG PO TABS: 1.0000 | ORAL_TABLET | Freq: Three times a day (TID) | ORAL | 0 refills | Status: DC | PRN

## 2019-11-02 NOTE — Assessment & Plan Note (Signed)
Continues to appear fungal to dorsal hand, appears improved compared to last visit. Continue ciclopirox as prescribed.

## 2019-11-02 NOTE — Assessment & Plan Note (Signed)
Chronic and intermittent, recent flare that is overall mild. Rx for triamcinolone ointment provided. She will update.

## 2019-11-02 NOTE — Assessment & Plan Note (Signed)
Refill provided today.  No suspicious activity noted on PMP aware site. She will be establishing with pain management in January 2021.

## 2019-11-02 NOTE — Patient Instructions (Signed)
Use the triamcinolone ointment to the psoriasis twice daily for one week.  Use the ciclopirox to the hand as discussed.  Please update me if you experience a widespread outbreak.  It was a pleasure to see you today!  Have a safe trip!

## 2019-11-02 NOTE — Progress Notes (Signed)
Subjective:    Patient ID: Emily Moon, female    DOB: 08/24/1961, 58 y.o.   MRN: [id number]  HPI   This visit occurred during the SARS-CoV-2 public health emergency.  Safety protocols were in place, including screening questions prior to the visit, additional usage of staff PPE, and extensive cleaning of exam room while observing appropriate contact time as indicated for disinfecting solutions.   Ms. Hertzberg is a 58 year old female with a history of multiple sclerosis, osteoarthritis, chronic pain syndrome, uncontrolled hyperlipidemia, Bipolar Disorder, nephrolithiasis, vitamin B12 deficiency who presents today with a chief complaint of rash.  She would also like a refill of her hydrocodone. She has not filled this since mid October as she's trying to wean down.   Her rash is located to the right dorsal hand that spreads up to her right mid forearm with bumps at night. This began 2-3 weeks ago. The rash is itchy in nature. She's using ciclopirox for the last two days, not sure if it's helping, maybe some.  She also endorses a return plaque psoriasis with plaques to her right lateral lower extremity and left shoulder. She would like a refill of her triamcinolone ointment.   Review of Systems  Constitutional: Negative for fever.  Skin: Positive for rash.  Allergic/Immunologic: Positive for environmental allergies.       Past Medical History:  Diagnosis Date  . Arthritis    osteo  . Asthma   . Bipolar disorder ([place]) 05/21/14  . Cataracts, bilateral   . DDD (degenerative disc disease), cervical    also back  . Depression   . Dizziness    Positional  . Edema    feet/legs  . Fibromyalgia syndrome   . Fungal infection    Finger nails  . GERD (gastroesophageal reflux disease)   . Gout   . Headache    seasonal allergies  . Heart palpitations   . 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 Hypercholesterolemia   . Multiple sclerosis (HCC)    weakness  . Nephrolithiasis    kidney stones  . Osteoporosis    osteoarthritis  . Pneumonia   . PONV (postoperative nausea and vomiting)    no problem after cataract surgery  . Psoriasis   . Renal stone   . Shortness of breath dyspnea    wheezing  . Sleep apnea 2012   sleep study / slight, no interventions  . Urinary frequency      Social History   Socioeconomic History  . Marital status: Divorced    Spouse name: Not on file  . Number of children: 1  . Years of education: Not on file  . Highest education level: Not on file  Occupational History  . 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 Smoking status: Former Smoker    Years: 25.00    Types: Cigarettes    Quit date: 11/16/2012    Years since quitting: 6.9  . Smokeless tobacco: Never Used  . Tobacco comment: occasional use  Substance and Sexual Activity  . Alcohol use: No    Alcohol/week: 0.0 standard drinks  . Drug use: No    Types: Methylphenidate  . Sexual activity: Never  Other Topics Concern  . Not on file  Social History Narrative  . Not on file   Social Determinants of Health   Financial Resource Strain:   . Difficulty of Paying Living Expenses: Not on file  Food  Insecurity:   . Worried About Charity fundraiser in the Last Year: Not on file  . Ran Out of Food in the Last Year: Not on file  Transportation Needs:   . Lack of Transportation (Medical): Not on file  . Lack of Transportation (Non-Medical): Not on file  Physical Activity:   . Days of Exercise per Week: Not on file  . Minutes of Exercise per Session: Not on file  Stress:   . Feeling of Stress : Not on file  Social Connections:   . Frequency of Communication with Friends and Family: Not on file  . Frequency of Social Gatherings with Friends and Family: Not on file  . Attends Religious Services: Not on file  . Active Member of Clubs or Organizations: Not on file  . Attends Archivist Meetings: Not on file  . Marital  Status: Not on file  Intimate Partner Violence:   . Fear of Current or Ex-Partner: Not on file  . Emotionally Abused: Not on file  . Physically Abused: Not on file  . Sexually Abused: Not on file    Past Surgical History:  Procedure Laterality Date  . CATARACT EXTRACTION W/PHACO Left 05/21/2015   Procedure: CATARACT EXTRACTION PHACO AND INTRAOCULAR LENS PLACEMENT (IOC);  Surgeon: Birder Robson, MD;  Location: [facility];  Service: Ophthalmology;  Laterality: Left;  [place] 00:35 AP% 22.9 CDE 8.11 fluid pack lot [id number]  . CATARACT EXTRACTION W/PHACO Right 06/04/2015   Procedure: CATARACT EXTRACTION PHACO AND INTRAOCULAR LENS PLACEMENT (IOC);  Surgeon: Birder Robson, MD;  Location: [facility];  Service: Ophthalmology;  Laterality: Right;  US:00:48 AP%: 10.5 CDE:5.08 Fluid lot [id number]  . CYSTOSCOPY/URETEROSCOPY/HOLMIUM LASER/STENT PLACEMENT Bilateral 09/22/2016   Procedure: CYSTOSCOPY/URETEROSCOPY/HOLMIUM LASER/STENT PLACEMENT;  Surgeon: Hollice Espy, MD;  Location: [facility];  Service: Urology;  Laterality: Bilateral;  . EYE SURGERY  2015   tissue biopsy  . FOOT SURGERY  2015  . JOINT REPLACEMENT Left 2013   hip replacement  . LITHOTRIPSY    . PTOSIS REPAIR Bilateral 02/18/2016   Procedure: BILATERAL PTOSIS REPAIR UPPER EYELIDS;  Surgeon: Karle Starch, MD;  Location: [place];  Service: Ophthalmology;  Laterality: Bilateral;  LEAVE PT EARLY AM  . thumb surgery Right   . TONSILLECTOMY  1973    Family History  Problem Relation Age of Onset  . Cancer Father        Abdomen with mastasis  . Cancer Mother   . Heart disease Mother   . Kidney disease Neg Hx   . Bladder Cancer Neg Hx   . Prostate cancer Neg Hx   . Kidney cancer Neg Hx     Allergies  Allergen Reactions  . Albuterol Shortness Of Breath and Other (See Comments)    Makes pt feel jittery/ tacycardic  . Crestor [Rosuvastatin] Other (See Comments)    Joint pain, muscle pain, and hair loss  . Halcion  [Triazolam] Other (See Comments)    Dizziness,headaches,bladder problems  . Levaquin [Levofloxacin In D5w] Diarrhea and Itching    Shoulder pain  . Naproxen Sodium Swelling    Patient tolerates in small doses  . Tylenol [Acetaminophen] Swelling    Patient tolerates in small doses  . Cefaclor Other (See Comments)    Doesn't remember---unsure if actually allergic   . Diclofenac Sodium Other (See Comments)    "made very sick"  . Sulfa Antibiotics Itching    Unsure of reaction possibly itching  . Tramadol Itching and Nausea And Vomiting  .  Aripiprazole Other (See Comments)    Muscle tension/cramping  . Ibuprofen Swelling    Patient tolerates in small doses    Current Outpatient Medications on File Prior to Visit  Medication Sig Dispense Refill  . ASPERCREME LIDOCAINE EX Apply 1 application topically 4 (four) times daily as needed (for pain.).     Marland Kitchen aspirin 325 MG tablet Take 325 mg by mouth every 4 (four) hours as needed for headache.     Marland Kitchen atorvastatin (LIPITOR) 20 MG tablet TAKE 1 TABLET BY MOUTH EVERY DAY 90 tablet 0  . Azelastine HCl 137 MCG/SPRAY SOLN PLACE 2 SPRAYS INTO BOTH NOSTRILS 2 (TWO) TIMES DAILY 30 mL 2  . BIOTIN PO Take 200 mg by mouth daily.     . Cholecalciferol (VITAMIN D3) 10000 units capsule Take 30,000 Units by mouth daily.     . ciclopirox (LOPROX) 0.77 % cream Apply topically 2 (two) times daily. 30 g 0  . cyanocobalamin (,VITAMIN B-12,) 1000 MCG/ML injection Inject 1 mL (1,000 mcg total) into the muscle every 3 (three) months. 1 mL 0  . diclofenac sodium (VOLTAREN) 1 % GEL Apply 4 g topically 4 (four) times daily. 500 g 5  . docusate sodium (COLACE) 100 MG capsule Take 1 capsule (100 mg total) by mouth 2 (two) times daily. 60 capsule 0  . DULoxetine (CYMBALTA) 20 MG capsule Take 2 capsules (40 mg total) by mouth daily. 60 capsule 5  . hydroquinone 4 % cream 1 APPLICATION TOPICALLY DAILY AS NEEDED FOR BLEMISHES.  3  . hydrOXYzine (VISTARIL) 50 MG capsule TAKE 1  CAPSLE BY MOUTH AT NOON, 1 AT 6 PM, AND 1 AS NEEDED 270 capsule 2  . lamoTRIgine (LAMICTAL) 150 MG tablet Take 2 tablets (300 mg total) by mouth at bedtime. 60 tablet 5  . levalbuterol (XOPENEX HFA) 45 MCG/ACT inhaler Inhale 1-2 puffs into the lungs every 6 (six) hours as needed for wheezing. 1 Inhaler 0  . levalbuterol (XOPENEX) 1.25 MG/3ML nebulizer solution USE 1 VIAL VIA NEBULIZER EVERY 4 HOURS AS NEEDED FOR WHEEZING OR SHORTNESS OF BREATH 270 mL 1  . Lysine 1000 MG TABS Take 2,000-4,000 mg by mouth at bedtime. 2000 mg scheduled at bedtime and patient will take 4000 mg if she has outbreak    . Magnesium 500 MG TABS Take 1 tablet by mouth at bedtime.    Marland Kitchen neomycin-bacitracin-polymyxin (NEOSPORIN) [phone] ointment Apply 1 application topically 4 (four) times daily as needed (for cut/scrapes.).    Marland Kitchen pantoprazole (PROTONIX) 40 MG tablet Take 1 tablet (40 mg total) by mouth daily. For heartburn. 90 tablet 1  . phenazopyridine (AZO-TABS) 95 MG tablet Take 95 mg by mouth 3 (three) times daily as needed for pain.    . polyethylene glycol powder (GLYCOLAX/MIRALAX) powder MIX 17 GRAMS (1 CAPFUL) WITH 4-8 OZ OF LIQUID AND TAKE BY MOUTH TWICE DAILY AS NEEDED 527 g 0  . temazepam (RESTORIL) 30 MG capsule TAKE ONE TO TWO CAPSULES BY MOUTH NIGHTLY AT BEDTIME 60 capsule 4  . tiZANidine (ZANAFLEX) 4 MG tablet Take 2 tablets (8 mg total) by mouth every 6 (six) hours as needed for muscle spasms. 240 tablet 0  . traZODone (DESYREL) 50 MG tablet Take 2 tablets (100 mg total) by mouth at bedtime. 60 tablet 7  . valACYclovir (VALTREX) 1000 MG tablet Take 2 tablets by mouth twice daily for one day. 4 tablet 0   No current facility-administered medications on file prior to visit.    BP 118/76  Pulse (!) 105   Temp (!) 96.1 F (35.6 C) (Temporal)   Ht 5\' 9"  (1.753 m)   Wt 227 lb 4 oz (103.1 kg)   LMP 08/23/2014   SpO2 97%   BMI 33.56 kg/m    Objective:   Physical Exam  Constitutional: She appears  well-nourished.  Cardiovascular: Normal rate and regular rhythm.  Respiratory: Effort normal.  Skin: Skin is warm and dry. Rash noted.  Very slight evidence of erythema of right hand in circular pattern. 0.5 cm rounded light pink scaly patch to right lateral lower thigh. Flesh colored scaly patch to left shoulder.            Assessment & Plan:

## 2019-11-03 ENCOUNTER — Ambulatory Visit: Payer: PPO | Attending: Internal Medicine

## 2019-11-03 DIAGNOSIS — Z20828 Contact with and (suspected) exposure to other viral communicable diseases: Secondary | ICD-10-CM | POA: Diagnosis not present

## 2019-11-03 DIAGNOSIS — Z20822 Contact with and (suspected) exposure to covid-19: Secondary | ICD-10-CM

## 2019-11-04 LAB — NOVEL CORONAVIRUS, NAA: SARS-CoV-2, NAA: NOT DETECTED

## 2019-11-18 ENCOUNTER — Other Ambulatory Visit: Payer: Self-pay | Admitting: Primary Care

## 2019-11-18 DIAGNOSIS — J3089 Other allergic rhinitis: Secondary | ICD-10-CM

## 2019-11-18 DIAGNOSIS — M62838 Other muscle spasm: Secondary | ICD-10-CM

## 2019-11-19 ENCOUNTER — Encounter: Payer: Self-pay | Admitting: Gastroenterology

## 2019-11-23 DIAGNOSIS — E538 Deficiency of other specified B group vitamins: Secondary | ICD-10-CM

## 2019-11-23 NOTE — Progress Notes (Signed)
Name: Emily Moon  MRN: [id number]  Referring Provider: [name], NP  DOB: [birth year]  PCP: [name], NP  DOS: 11/28/2019  Note by: Gillis Santa, MD  Service setting: Ambulatory outpatient  Specialty: Interventional Pain Management  Location: [facility] Virtual Visit  Visit type: Initial Patient Evaluation  Patient type: New Patient   Virtual Encounter - Pain Management PROVIDER NOTE: Information contained herein reflects review and annotations entered in association with encounter. Interpretation of such information and data should be left to medically-trained personnel. Information provided to patient can be located elsewhere in the medical record under "Patient Instructions". Document created using STT-dictation technology, any transcriptional errors that may result from process are unintentional.    Contact & Pharmacy Preferred: [phone] Home: [phone] (home) Mobile: [phone] (mobile) E-mail: [email or web address]  [facility] [place], [place] [address] [place] [address] Phone: [phone] Fax: [phone]   Pre-screening note:  Our staff contacted [name] and offered her an "in person", "face-to-face" appointment versus a telephone encounter. She indicated preferring the telephone encounter, at this time.  Primary Reason(s) for Visit: Tele-Encounter for initial evaluation of one or more chronic problems (new to examiner) potentially causing chronic pain, and posing a threat to normal musculoskeletal function. (Level of risk: High) CC:   Chronic pain from [place] (1992), low back pain, hip pain  I contacted [name] on 11/28/2019 via telephone.      I clearly identified myself as Gillis Santa, MD. I verified that I was speaking with the correct person using two identifiers (Name: Emily Moon, and date of birth: 25-Jan-1961).  This visit was completed via telephone due to the restrictions  of the COVID-19 pandemic. All issues as above were discussed and addressed but no physical exam was performed. If it was felt that the patient should be evaluated in the office, they were directed there. The patient verbally consented to this visit. Patient was unable to complete an audio/visual visit due to Technical difficulties and/or Lack of internet. Due to the catastrophic nature of the COVID-19 pandemic, this visit was done through audio contact only.  Location of the patient: home address (see Epic for details)  Location of the provider: office   Advanced Informed Consent I sought verbal advanced consent from [name] for virtual visit interactions. I informed [name] of possible security and privacy concerns, risks, and limitations associated with providing "not-in-person" medical evaluation and management services. I also informed [name] of the availability of "in-person" appointments. Finally, I informed her that there would be a charge for the virtual visit and that she could be  personally, fully or partially, financially responsible for it. [name] expressed understanding and agreed to proceed.   HPI  [name] is a 59 y.o. year old, female patient, contacted today for an initial evaluation of her chronic pain. 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   Onset and Duration: Gradual and Present longer than 3 months Cause of pain: multiple schlerosis Severity: Getting worse, NAS-11 at its worse: 10/10, NAS-11 at its best: 7/10, NAS-11 now: 2/10 and NAS-11 on the average: 4/10 Timing: Morning and Night Aggravating Factors: Walking Alleviating Factors: Medications Associated Problems: Tingling Quality of Pain: Burning, Shooting and Stabbing Previous Examinations or Tests: MRI scan and Neurological evaluation Previous Treatments: Narcotic medications  Dx with MS in 1992.  August 2020- bilateral leg pain, started having parasthesias at that time Has walkers and canes in multiple rooms of the house Has difficulty ambulating Has been told she has Lumbar DDD, and hip osteoarthritis Is on cymbalta 40 mg daily, Magnesium, Tizanidine prn usually qhs (since 2011). Makes her have dry mouth, and causes increased urination.  Has tried Gabapentin in the past, made her very dizzy. Avoid Lyrica   States that Toradol does help, has received 10 mg q6 hrs for 5 days Does utilize THC regularly Bipolar disorder   Historic Controlled Substance Pharmacotherapy Review  Current opioid analgesics: 11/02/2019  2   11/02/2019  Hydrocodone-Acetamin 10-325 MG  90.00  [address] [place]   [id number]   [place] (0290)   0  30.00 MME  Medicare   [place]    Historical Monitoring: The patient +THC List of all UDS Test(s): No results found for: MDMA, COCAINSCRNUR, [place], [place], CANNABQUANT, THCU, [place] List of other Serum/Urine Drug Screening Test(s):  No results found for: AMPHSCRSER, BARBSCRSER, BENZOSCRSER, COCAINSCRSER, COCAINSCRNUR, PCPSCRSER, PCPQUANT, THCSCRSER, THCU, CANNABQUANT, OPIATESCRSER, OXYSCRSER, PROPOXSCRSER, ETH Historical Background Evaluation: [place] PMP: PDMP reviewed during  this encounter. Two (2) year initial data search conducted.               Pharmacologic Plan: Non-opioid analgesic therapy offered.            Initial impression: Pending review of available data and ordered tests.  Meds   Current Outpatient Medications:  .  ASPERCREME LIDOCAINE EX, Apply 1 application topically 4 (four) times daily as needed (for pain.). , Disp: , Rfl:  .  aspirin 325 MG tablet, Take 325 mg by mouth every 4 (four) hours as needed for headache. , Disp: , Rfl:  .  atorvastatin (LIPITOR) 20 MG tablet, TAKE 1 TABLET BY MOUTH EVERY DAY, Disp: 90 tablet, Rfl: 0 .  Azelastine HCl 137 MCG/SPRAY SOLN, PLACE 2 SPRAYS INTO BOTH NOSTRILS 2 (TWO) TIMES DAILY, Disp: 90 mL, Rfl: 0 .  BIOTIN PO, Take 200 mg by mouth daily. , Disp: , Rfl:  .  Cholecalciferol (VITAMIN D3) 10000 units capsule, Take 30,000 Units by mouth daily. , Disp: , Rfl:  .  ciclopirox (LOPROX) 0.77 % cream, Apply topically 2 (two) times daily., Disp: 30 g, Rfl: 0 .  cyanocobalamin (,VITAMIN B-12,) 1000 MCG/ML injection, Inject 1 mL (1,000 mcg total) into the muscle every 3 (three) months., Disp: 1 mL, Rfl: 0 .  diclofenac sodium (VOLTAREN) 1 % GEL, Apply 4 g topically 4 (four) times daily., Disp: 500 g, Rfl: 5 .  docusate sodium (COLACE) 100 MG capsule, Take 1 capsule (100 mg total) by mouth 2 (two) times daily., Disp: 60 capsule, Rfl: 0 .  DULoxetine (CYMBALTA) 20 MG capsule, Take 2 capsules (40 mg total) by mouth daily., Disp: 60 capsule, Rfl: 5 .  HYDROcodone-acetaminophen (NORCO) 10-325 MG tablet, Take 1 tablet by mouth every 8 (eight) hours as needed for moderate pain or severe pain. Supervising physician; Dr Warren Lacy  Diona Browner; DEA# [id number], Disp: 90 tablet, Rfl: 0 .  hydroquinone 4 % cream, 1 APPLICATION TOPICALLY DAILY AS NEEDED FOR BLEMISHES., Disp: , Rfl: 3 .  hydrOXYzine (VISTARIL) 50 MG capsule, TAKE 1 CAPSLE BY MOUTH AT NOON, 1 AT 6 PM, AND 1 AS NEEDED, Disp: 270 capsule, Rfl: 2 .  lamoTRIgine (LAMICTAL) 150 MG  tablet, Take 2 tablets (300 mg total) by mouth at bedtime., Disp: 60 tablet, Rfl: 5 .  levalbuterol (XOPENEX HFA) 45 MCG/ACT inhaler, Inhale 1-2 puffs into the lungs every 6 (six) hours as needed for wheezing., Disp: 1 Inhaler, Rfl: 0 .  levalbuterol (XOPENEX) 1.25 MG/3ML nebulizer solution, USE 1 VIAL VIA NEBULIZER EVERY 4 HOURS AS NEEDED FOR WHEEZING OR SHORTNESS OF BREATH, Disp: 270 mL, Rfl: 1 .  Lysine 1000 MG TABS, Take 2,000-4,000 mg by mouth at bedtime. 2000 mg scheduled at bedtime and patient will take 4000 mg if she has outbreak, Disp: , Rfl:  .  Magnesium 500 MG TABS, Take 1 tablet by mouth at bedtime., Disp: , Rfl:  .  neomycin-bacitracin-polymyxin (NEOSPORIN) [phone] ointment, Apply 1 application topically 4 (four) times daily as needed (for cut/scrapes.)., Disp: , Rfl:  .  pantoprazole (PROTONIX) 40 MG tablet, Take 1 tablet (40 mg total) by mouth daily. For heartburn., Disp: 90 tablet, Rfl: 1 .  phenazopyridine (AZO-TABS) 95 MG tablet, Take 95 mg by mouth 3 (three) times daily as needed for pain., Disp: , Rfl:  .  polyethylene glycol powder (GLYCOLAX/MIRALAX) powder, MIX 17 GRAMS (1 CAPFUL) WITH 4-8 OZ OF LIQUID AND TAKE BY MOUTH TWICE DAILY AS NEEDED, Disp: 527 g, Rfl: 0 .  temazepam (RESTORIL) 30 MG capsule, TAKE ONE TO TWO CAPSULES BY MOUTH NIGHTLY AT BEDTIME, Disp: 60 capsule, Rfl: 4 .  tiZANidine (ZANAFLEX) 4 MG tablet, TAKE 2 TABLETS (8 MG TOTAL) BY MOUTH EVERY 6 (SIX) HOURS AS NEEDED FOR MUSCLE SPASMS., Disp: 240 tablet, Rfl: 0 .  traZODone (DESYREL) 50 MG tablet, Take 2 tablets (100 mg total) by mouth at bedtime., Disp: 60 tablet, Rfl: 7 .  triamcinolone ointment (KENALOG) 0.5 %, Apply 1 application topically 2 (two) times daily., Disp: 30 g, Rfl: 0 .  valACYclovir (VALTREX) 1000 MG tablet, Take 2 tablets by mouth twice daily for one day., Disp: 4 tablet, Rfl: 0 .  ketorolac (TORADOL) 10 MG tablet, Take 1 tablet (10 mg total) by mouth every 8 (eight) hours as needed., Disp: 30  tablet, Rfl: 1  ROS  Cardiovascular: Heart trouble Pulmonary or Respiratory: Wheezing and difficulty taking a deep full breath (Asthma), Shortness of breath and Temporary stoppage of breathing during sleep Neurological: No reported neurological signs or symptoms such as seizures, abnormal skin sensations, urinary and/or fecal incontinence, being born with an abnormal open spine and/or a tethered spinal cord Psychological-Psychiatric: Psychiatric disorder and Depressed Gastrointestinal: Reflux or heatburn Genitourinary: Kidney disease Hematological: Brusing easily Endocrine: No reported endocrine signs or symptoms such as high or low blood sugar, rapid heart rate due to high thyroid levels, obesity or weight gain due to slow thyroid or thyroid disease Rheumatologic: Joint aches and or swelling due to excess weight (Osteoarthritis) Musculoskeletal: Multiple sclerosis Work History: Disabled  Allergies  [name] is allergic to albuterol; crestor [rosuvastatin]; halcion [triazolam]; levaquin [levofloxacin in d5w]; naproxen sodium; tylenol [acetaminophen]; cefaclor; diclofenac sodium; sulfa antibiotics; tramadol; aripiprazole; and ibuprofen.  Laboratory Chemistry Profile   Screening Lab Results  Component Value Date   [place] Not Detected 11/03/2019   HCVAB NEGATIVE 12/17/2016  PREGTESTUR NEGATIVE 10/10/2016    Inflammation (CRP: Acute Phase) (ESR: Chronic Phase) Lab Results  Component Value Date   CRP 0.2 (L) 10/24/2018   LATICACIDVEN 1.5 10/10/2016                         Rheumatology Lab Results  Component Value Date   RF <14 10/24/2018   ANA NEGATIVE 10/24/2018   LABURIC 4.5 10/24/2018                        Renal Lab Results  Component Value Date   BUN 20 05/10/2019   CREATININE 0.73 05/10/2019   GFR 82.15 05/10/2019   GFRAA >60 10/13/2016   GFRNONAA >60 10/13/2016                             Hepatic Lab Results  Component Value Date   AST 10 11/24/2018    ALT 9 11/24/2018   ALBUMIN 4.0 11/24/2018   ALKPHOS 64 11/24/2018   HCVAB NEGATIVE 12/17/2016                        Electrolytes Lab Results  Component Value Date   NA 138 05/10/2019   K 4.6 05/10/2019   CL 102 05/10/2019   CALCIUM 9.2 05/10/2019   MG 2.2 07/27/2017                        Neuropathy Lab Results  Component Value Date   VITAMINB12 214 08/15/2019   HGBA1C 5.9 08/15/2019                         Bone Lab Results  Component Value Date   VD25OH 61.68 08/15/2019                         Coagulation Lab Results  Component Value Date   PLT 272.0 08/15/2019                        Cardiovascular Lab Results  Component Value Date   CKTOTAL 81 04/29/2011   HGB 11.2 Repeated and verified X2. (L) 08/15/2019   HCT 33.4 (L) 08/15/2019                         ID Lab Results  Component Value Date   SARSCOV2NAA Not Detected 11/03/2019   HCVAB NEGATIVE 12/17/2016   PREGTESTUR NEGATIVE 10/10/2016    Endocrine Lab Results  Component Value Date   TSH 1.31 05/10/2019   FREET4 0.82 07/25/2013                        Note: Lab results reviewed.  Imaging Review    Results for orders placed during the hospital encounter of 11/22/17  DG HIP UNILAT WITH PELVIS 2-3 VIEWS RIGHT   Narrative CLINICAL DATA:  Right hip pain after shoveling snow.  EXAM: DG HIP (WITH OR WITHOUT PELVIS) 2-3V RIGHT  COMPARISON:  Hip radiograph 04/22/2015  FINDINGS: Normal appearance of the visualized portion of the left total hip arthroplasty. There is no fracture or dislocation of the right hip. Mild spurring at the superior acetabular margin.  IMPRESSION: No fracture or dislocation of the right hip.  Electronically Signed   By: Ulyses Jarred M.D.   On: 11/22/2017 14:16    Hip-L DG 2-3 views:  Results for orders placed during the hospital encounter of 02/10/16  DG HIP UNILAT WITH PELVIS 2-3 VIEWS LEFT   Narrative CLINICAL DATA:  Left hip pain for 2 months, status  post fall. Prior left hip replacement  EXAM: DG HIP (WITH OR WITHOUT PELVIS) 2-3V LEFT  COMPARISON:  11/27/2015  FINDINGS: Changes of prior left hip replacement. No hardware or bony complicating feature. No fracture, subluxation or dislocation. SI joints are symmetric and unremarkable.  IMPRESSION: Prior left hip replacement.  No acute bony abnormality.   Electronically Signed   By: Rolm Baptise M.D.   On: 02/10/2016 15:33     Results for orders placed during the hospital encounter of 01/06/16  DG Ankle Complete Left   Narrative CLINICAL DATA:  left ankle pain, trauma  EXAM: LEFT ANKLE COMPLETE - 3+ VIEW  COMPARISON:  None.  FINDINGS: Three views of the left ankle submitted. No acute fracture or subluxation. Mild lateral soft tissue swelling. Ankle mortise is preserved.  IMPRESSION: No acute fracture or subluxation. Mild lateral soft tissue swelling. Ankle mortise is preserved.   Electronically Signed   By: Lahoma Crocker M.D.   On: 01/07/2016 08:06     Foot Imaging: Foot-R DG Complete:  Results for orders placed during the hospital encounter of 06/12/19  DG Foot Complete Right   Narrative CLINICAL DATA:  Trauma, pain  EXAM: RIGHT FOOT COMPLETE - 3+ VIEW  COMPARISON:  05/09/2014, 03/08/2014, 03/29/2014  FINDINGS: Possible oblique lucency at the head of the first metatarsal on one view. Postsurgical deformity of the distal first metatarsal. Fixating screw in the distal first and fifth metatarsals. No subluxation. No radiopaque foreign body in the soft tissues. Small plantar calcaneal spur.  IMPRESSION: Possible linear lucency/nondisplaced fracture at the head of the first metatarsal on one view.   Electronically Signed   By: Donavan Foil M.D.   On: 06/12/2019 21:51    Foot-L DG Complete:  Results for orders placed in visit on 01/18/14  DG Foot Complete Left   Narrative 3 views of the left foot demonstrates a rectus foot. Mild hallux   abductovalgus deformity with an increase in the first intermetatarsal  angle and abduction of the hallux greater than normal value. No other  osseous abnormalities noted.    Elbow Imaging: Elbow-R DG Complete:  Results for orders placed during the hospital encounter of 03/09/19  DG ELBOW COMPLETE RIGHT (3+VIEW)   Narrative CLINICAL DATA:  Right elbow pain after fall.  EXAM: RIGHT ELBOW - COMPLETE 3+ VIEW  COMPARISON:  01/30/2019  FINDINGS: There is no evidence of fracture, dislocation, or joint effusion. There is no evidence of arthropathy or other focal bone abnormality. Soft tissues are unremarkable.  IMPRESSION: Negative.   Electronically Signed   By: Aletta Edouard M.D.   On: 03/09/2019 16:42     Wrist Imaging: Wrist-R DG Complete:  Results for orders placed in visit on 01/27/16  DG Wrist Complete Right   Narrative CLINICAL DATA:  Follow-up of right wrist fracture  EXAM: RIGHT WRIST - COMPLETE 3+ VIEW  COMPARISON:  01/06/2016; 12/27/2013  FINDINGS: No evidence of acute or chronic fracture with special attention paid to the distal aspect of the right ulna. Joint spaces are preserved. No erosions. No evidence of chondrocalcinosis. Regional soft tissues appear normal. No displacement of the pronator quadratus fat pad.  IMPRESSION: No evidence of  acute or chronic fracture involving the right wrist with special attention paid to the distal ulna.   Electronically Signed   By: Sandi Mariscal M.D.   On: 01/27/2016 15:20    Hand Imaging: Hand-R DG Complete:  Results for orders placed during the hospital encounter of 12/27/13  DG Hand Complete Right   Narrative CLINICAL DATA:  Right hand injury.  EXAM: RIGHT HAND - COMPLETE 3+ VIEW  COMPARISON:  DG WRIST COMPLETE*R* dated 10/30/2013  FINDINGS: Chronic loss of articular space at the first metacarpophalangeal joint.  No fracture, dislocation, or acute bony findings.  IMPRESSION: 1. No acute bony  findings. 2. Degenerative loss of articular space at the first metacarpophalangeal joint.   Electronically Signed   By: Sherryl Barters M.D.   On: 12/27/2013 13:54     Complexity Note: Imaging results reviewed. Results shared with Ms. Eshelman, using Layman's terms.                         PFSH  Drug: Ms. Mckee  reports no history of drug use. Alcohol:  reports no history of alcohol use. Tobacco:  reports that she quit smoking about 7 years ago. Her smoking use included cigarettes. She quit after 25.00 years of use. She has never used smokeless tobacco. Medical:  has a past medical history of Arthritis, Asthma, Bipolar disorder ([place]) (05/21/14), Cataracts, bilateral, DDD (degenerative disc disease), cervical, Depression, Dizziness, Edema, Fibromyalgia syndrome, Fungal infection, GERD (gastroesophageal reflux disease), Gout, Headache, Heart palpitations, Hip dysplasia, congenital (09/15/2013), Hypercholesterolemia, Multiple sclerosis ([place]), Nephrolithiasis, Osteoporosis, Pneumonia, PONV (postoperative nausea and vomiting), Psoriasis, Renal stone, Shortness of breath dyspnea, Sleep apnea (2012), and Urinary frequency. Family: family history includes Cancer in her father and mother; Heart disease in her mother.  Past Surgical History:  Procedure Laterality Date  . CATARACT EXTRACTION W/PHACO Left 05/21/2015   Procedure: CATARACT EXTRACTION PHACO AND INTRAOCULAR LENS PLACEMENT (IOC);  Surgeon: Birder Robson, MD;  Location: [facility];  Service: Ophthalmology;  Laterality: Left;  [place] 00:35 AP% 22.9 CDE 8.11 fluid pack lot #[id number]  . CATARACT EXTRACTION W/PHACO Right 06/04/2015   Procedure: CATARACT EXTRACTION PHACO AND INTRAOCULAR LENS PLACEMENT (IOC);  Surgeon: Birder Robson, MD;  Location: [facility];  Service: Ophthalmology;  Laterality: Right;  US:00:48 AP%: 10.5 CDE:5.08 Fluid lot #[id number]  . CYSTOSCOPY/URETEROSCOPY/HOLMIUM LASER/STENT PLACEMENT Bilateral 09/22/2016   Procedure:  CYSTOSCOPY/URETEROSCOPY/HOLMIUM LASER/STENT PLACEMENT;  Surgeon: Hollice Espy, MD;  Location: [facility];  Service: Urology;  Laterality: Bilateral;  . EYE SURGERY  2015   tissue biopsy  . FOOT SURGERY  2015  . JOINT REPLACEMENT Left 2013   hip replacement  . LITHOTRIPSY    . PTOSIS REPAIR Bilateral 02/18/2016   Procedure: BILATERAL PTOSIS REPAIR UPPER EYELIDS;  Surgeon: Karle Starch, MD;  Location: [place];  Service: Ophthalmology;  Laterality: Bilateral;  LEAVE PT EARLY AM  . thumb surgery Right   . TONSILLECTOMY  1973   Active Ambulatory Problems    Diagnosis Date Noted  . MULTIPLE SCLEROSIS, PROGRESSIVE/RELAPSING 08/28/2010  . GERD 08/28/2010  . Osteoarthritis 08/28/2010  . NEPHROLITHIASIS, HX OF 08/28/2010  . RENAL CALCULUS, RECURRENT 11/13/2010  . Chronic pain syndrome 01/07/2011  . HLD (hyperlipidemia) 01/20/2011  . Tobacco abuse 01/20/2011  . Chest pain 01/20/2011  . Obesity 05/04/2011  . Insomnia 09/01/2011  . 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 Bipolar disorder ([place]) 05/21/2014  .  Weight gain 06/21/2014  . Back pain 10/31/2014  . Medicare annual wellness visit, subsequent 10/25/2015  . Dizziness 03/16/2016  . B12 deficiency 05/22/2016  . Urinary frequency 03/30/2017  . Preventative health care 11/24/2017  . Vitamin D deficiency 02/15/2018  . Bipolar I disorder, most recent episode (or current) manic ([place]) 05/20/2018  . Prediabetes 07/22/2018  . Greater trochanteric bursitis of right hip 09/28/2018  . Acute pain of right shoulder 09/28/2018  . Joint swelling 10/24/2018  . Environmental and seasonal allergies 10/24/2018  . OSA (obstructive sleep apnea) 12/02/2018  . Epigastric pain 12/21/2018  . Asthma 02/10/2019  . Rash and nonspecific skin eruption 02/20/2019  . Psoriasis 11/02/2019  . Marijuana use 11/28/2019   Resolved Ambulatory Problems     Diagnosis Date Noted  . OTHER AND UNSPECIFIED BIPOLAR DISORDERS 08/28/2010  . DEPRESSION 08/28/2010  . SYNCOPE 10/23/2010  . SKIN RASH 10/23/2010  . HERPES ZOSTER 12/15/2010  . Acute bronchitis 12/19/2010  . EYE PAIN, LEFT 12/24/2010  . Palpitations 01/16/2011  . Well woman exam with routine gynecological exam 02/05/2011  . Perimenopausal vasomotor symptoms 02/05/2011  . Hyperlipidemia 02/12/2011  . Nausea 03/27/2011  . Leg cramps 04/29/2011  . Sinusitis 05/14/2011  . Left hip pain 06/03/2011  . Osteoarthritis of hip 08/05/2011  . Fever 09/01/2011  . Dysuria 05/18/2013  . Irregular menstrual cycle 05/18/2013  . Hot flashes 07/25/2013  . Edema 07/25/2013  . Right flank pain 08/15/2013  . Difficulty in urination 08/16/2013  . Grief 11/29/2013  . Injury of right hand 12/27/2013  . Nail fungus 01/22/2014  . Preoperative clearance 01/22/2014  . Difficulty in swallowing 03/07/2014  . ETD (eustachian tube dysfunction) 03/07/2014  . Menstrual abnormality 06/21/2014  . Encounter for routine gynecological examination 06/21/2014  . Chest wall pain 08/05/2014  . Acute upper respiratory infection 10/31/2014  . Gastroenteritis, acute 10/31/2014  . Decreased urination 10/31/2014  . Perianal abscess 11/07/2014  . Urinary retention 11/22/2015  . Dysuria 04/23/2016  . Sepsis ([place]) 10/10/2016  . Cough 03/30/2017  . Viral URI with cough 02/10/2019   Past Medical History:  Diagnosis Date  . Arthritis   . Cataracts, bilateral   . DDD (degenerative disc disease), cervical   . Depression   . Fibromyalgia syndrome   . Fungal infection   . GERD (gastroesophageal reflux disease)   . Gout   . Headache   . Heart palpitations   . Hypercholesterolemia   . Nephrolithiasis   . Osteoporosis   . Pneumonia   . PONV (postoperative nausea and vomiting)   . Renal stone   . Shortness of breath dyspnea   . Sleep apnea 2012   Assessment  Primary Diagnosis & Pertinent Problem List: The  primary encounter diagnosis was MULTIPLE SCLEROSIS, PROGRESSIVE/RELAPSING. 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  Visit Diagnosis (New problems to examiner): 1. MULTIPLE SCLEROSIS, PROGRESSIVE/RELAPSING   2. NEPHROLITHIASIS, HX OF   3. Chronic pain syndrome   4. 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 Bipolar affective disorder in remission ([place])   6. B12 deficiency   7. Bipolar I disorder, most recent episode (or current) manic ([place])   8. Marijuana use    Plan of Care    General Recommendations: The pain condition that the patient suffers from is best treated  with a multidisciplinary approach that involves an increase in physical activity to prevent de-conditioning and worsening of the pain cycle, as well as psychological counseling (formal and/or informal) to address the co-morbid psychological affects of pain. Treatment will often involve judicious use of pain medications and interventional procedures to decrease the pain, allowing the patient to participate in the physical activity that will ultimately produce long-lasting pain reductions. The goal of the multidisciplinary approach is to return the patient to a higher level of overall function and to restore their ability to perform activities of daily living.  Do NOT recommend chronic opioid therapy in the context of regular THC use, bipolar d/o Rx for Toradol PO below. Risks and benefits discussed. Advised patient to avoid all other NSAIDs while taking Toradol which is only PRN for severe pain flare Tried Gabapentin, resulted in severe cognitive side effects and balance issues, do not recommend Lyrica since similar MOA On Cymbalta 40 mg daily, 60 mg resulted in SE On  Tizanidine, Restoril, Magnesium  Recommend patient increase Magnesium to 800 mg daily Discussed complimentary and alternative pain strategies including acupuncture, yoga, aquatic therapy, myofascial release therapy, massage therapy   Requested Prescriptions   Signed Prescriptions Disp Refills  . ketorolac (TORADOL) 10 MG tablet 30 tablet 1    Sig: Take 1 tablet (10 mg total) by mouth every 8 (eight) hours as needed.    Provider-requested follow-up: Return if symptoms worsen or fail to improve.  Future Appointments  Date Time Provider [place]  01/03/2020  1:30 PM Plovsky, Berneta Sages, MD [facility] None    Total duration of non-face-to-face encounter: 45 minutes.  Primary Care Physician: [name], NP Location: [facility] Note by: Gillis Santa, MD Date: 11/28/2019; Time: 1:39 PM  Note: This dictation was prepared with Dragon dictation. Any transcriptional errors that may result from this process are unintentional.

## 2019-11-27 ENCOUNTER — Telehealth: Payer: Self-pay

## 2019-11-27 NOTE — Telephone Encounter (Signed)
Attempted to call patient for pre virtual new pt appointment.  Her voicemail has not been set up so I cant leave a message.

## 2019-11-28 ENCOUNTER — Ambulatory Visit
Payer: PPO | Attending: Student in an Organized Health Care Education/Training Program | Admitting: Student in an Organized Health Care Education/Training Program

## 2019-11-28 ENCOUNTER — Encounter: Payer: Self-pay | Admitting: Student in an Organized Health Care Education/Training Program

## 2019-11-28 ENCOUNTER — Other Ambulatory Visit: Payer: Self-pay

## 2019-11-28 DIAGNOSIS — Z87442 Personal history of urinary calculi: Secondary | ICD-10-CM | POA: Diagnosis not present

## 2019-11-28 DIAGNOSIS — M1631 Unilateral osteoarthritis resulting from hip dysplasia, right hip: Secondary | ICD-10-CM | POA: Diagnosis not present

## 2019-11-28 DIAGNOSIS — F3174 Bipolar disorder, in full remission, most recent episode manic: Secondary | ICD-10-CM

## 2019-11-28 DIAGNOSIS — G35 Multiple sclerosis: Secondary | ICD-10-CM | POA: Diagnosis not present

## 2019-11-28 DIAGNOSIS — F1290 Cannabis use, unspecified, uncomplicated: Secondary | ICD-10-CM | POA: Diagnosis not present

## 2019-11-28 DIAGNOSIS — G894 Chronic pain syndrome: Secondary | ICD-10-CM

## 2019-11-28 DIAGNOSIS — E538 Deficiency of other specified B group vitamins: Secondary | ICD-10-CM

## 2019-11-28 DIAGNOSIS — F3110 Bipolar disorder, current episode manic without psychotic features, unspecified: Secondary | ICD-10-CM

## 2019-11-28 DIAGNOSIS — F3170 Bipolar disorder, currently in remission, most recent episode unspecified: Secondary | ICD-10-CM

## 2019-11-28 MED ORDER — KETOROLAC TROMETHAMINE 10 MG PO TABS: 10.0000 mg | ORAL_TABLET | Freq: Three times a day (TID) | ORAL | 1 refills | Status: DC | PRN

## 2019-12-01 ENCOUNTER — Other Ambulatory Visit: Payer: Self-pay

## 2019-12-06 ENCOUNTER — Other Ambulatory Visit: Payer: Self-pay

## 2019-12-06 ENCOUNTER — Other Ambulatory Visit: Payer: PPO

## 2019-12-07 ENCOUNTER — Other Ambulatory Visit: Payer: PPO

## 2019-12-15 ENCOUNTER — Other Ambulatory Visit: Payer: Self-pay | Admitting: Primary Care

## 2019-12-15 DIAGNOSIS — K219 Gastro-esophageal reflux disease without esophagitis: Secondary | ICD-10-CM

## 2019-12-19 ENCOUNTER — Telehealth: Payer: Self-pay | Admitting: Student in an Organized Health Care Education/Training Program

## 2019-12-19 NOTE — Telephone Encounter (Signed)
Patient called to say she is allergic to the mints with THC so she is having to stop taking this. She wants to make an appt in 45 days. She will call to set this up.

## 2019-12-25 ENCOUNTER — Ambulatory Visit: Payer: PPO | Admitting: Gastroenterology

## 2019-12-28 ENCOUNTER — Ambulatory Visit: Payer: PPO | Attending: Internal Medicine

## 2019-12-28 DIAGNOSIS — Z20822 Contact with and (suspected) exposure to covid-19: Secondary | ICD-10-CM | POA: Diagnosis not present

## 2019-12-29 LAB — NOVEL CORONAVIRUS, NAA: SARS-CoV-2, NAA: NOT DETECTED

## 2020-01-03 ENCOUNTER — Other Ambulatory Visit: Payer: Self-pay

## 2020-01-03 ENCOUNTER — Encounter (HOSPITAL_COMMUNITY): Payer: Self-pay | Admitting: Psychiatry

## 2020-01-03 ENCOUNTER — Ambulatory Visit (INDEPENDENT_AMBULATORY_CARE_PROVIDER_SITE_OTHER): Payer: PPO | Admitting: Psychiatry

## 2020-01-03 DIAGNOSIS — L409 Psoriasis, unspecified: Secondary | ICD-10-CM

## 2020-01-03 DIAGNOSIS — R21 Rash and other nonspecific skin eruption: Secondary | ICD-10-CM

## 2020-01-03 DIAGNOSIS — F310 Bipolar disorder, current episode hypomanic: Secondary | ICD-10-CM

## 2020-01-03 MED ORDER — HYDROXYZINE PAMOATE 50 MG PO CAPS: ORAL_CAPSULE | ORAL | 2 refills | Status: DC

## 2020-01-03 MED ORDER — DULOXETINE HCL 20 MG PO CPEP: 40.0000 mg | ORAL_CAPSULE | Freq: Every day | ORAL | 5 refills | Status: DC

## 2020-01-03 MED ORDER — LAMOTRIGINE 150 MG PO TABS: 300.0000 mg | ORAL_TABLET | Freq: Every day | ORAL | 5 refills | Status: DC

## 2020-01-03 MED ORDER — ESZOPICLONE 3 MG PO TABS: 3.0000 mg | ORAL_TABLET | Freq: Every evening | ORAL | 4 refills | Status: DC | PRN

## 2020-01-03 MED ORDER — DESIPRAMINE HCL 25 MG PO TABS: ORAL_TABLET | ORAL | 3 refills | Status: DC

## 2020-01-03 NOTE — Progress Notes (Signed)
The most painful.  This  Psychiatric Initial Adult Assessment   Patient Identification: Emily Moon MRN:  [id number] Date of Evaluation:  01/03/2020 Referral Source: From the community Chief Complaint: Feels exhausted  This patient is currently changed.  She appears more depressed.  She is very isolated.  She is had a number of stresses that clearly have affected her life on a day-to-day basis.  Her MS is worse.  She is now having difficulty walking to the point where she needs a cane or must use a walker.  She has significant pain as well.  She has an MRI coming up soon.  She feels more depressed on a day-to-day basis.  The only thing she can do is watch television.  She can focus it off to concentrate.  Her energy level was relatively low.  Her sleep is disturbed as she takes all her medicines at midnight including Restoril but does not fall asleep for hours later.  Generally however she does get 6 to 7 hours of sleep.  Her total sleep is normal but it shifted to being later than it should.  The patient also is very distressed by the fact that her appetite is increased and in fact over the last few months has gained 30 pounds.  She feels very bad about herself.  She has 1 son who she was having contact with and still is.  Unfortunately he is going through a bad divorce.  So her only now that is to her son and he is not doing well.  When he calls she is frightened she is good to hear more bad news.  She did have an older woman who came to visit her and still comes now and then.  But generally the patient has few friends.  Patient denies being suicidal.  The patient also has some dental work coming up that she is not looking forward to.  Patient: Emily Moon  Procedure(s) Performed: * No surgery found *  Anesthes  Patient location:   Post pain:   Post assessment:   Last Vitals:  There were no vitals filed for this visit.  Post vital signs:   Level of consciousness:    Complications: . Associated Signs/Symptoms: Depression Symptoms:  hopelessness, (Hypo) Manic Symptoms:   Anxiety Symptoms:   Psychotic Symptoms:   PTSD Symptoms:   Past Psychiatric History: The patient was seen in a psychiatric office and takes multiple medications she's not been recently hospitalized.  Previous Psychotropic Medications: Yes   Substance Abuse History in the last 12 months:  No.  Consequences of Substance Abuse: Negative  Past Medical History:  Past Medical History:  Diagnosis Date  . Arthritis    osteo  . Asthma   . Bipolar disorder ([place]) 05/21/14  . Cataracts, bilateral   . DDD (degenerative disc disease), cervical    also back  . Depression   . Dizziness    Positional  . Edema    feet/legs  . Fibromyalgia syndrome   . Fungal infection    Finger nails  . GERD (gastroesophageal reflux disease)   . Gout   . Headache    seasonal allergies  . Heart palpitations   . 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 Hypercholesterolemia   . Multiple sclerosis (HCC)    weakness  . Nephrolithiasis    kidney stones  . Osteoporosis    osteoarthritis  . Pneumonia   . PONV (postoperative nausea and vomiting)    no problem after cataract  surgery  . Psoriasis   . Renal stone   . Shortness of breath dyspnea    wheezing  . Sleep apnea 2012   sleep study / slight, no interventions  . Urinary frequency     Past Surgical History:  Procedure Laterality Date  . CATARACT EXTRACTION W/PHACO Left 05/21/2015   Procedure: CATARACT EXTRACTION PHACO AND INTRAOCULAR LENS PLACEMENT (IOC);  Surgeon: Birder Robson, MD;  Location: [facility];  Service: Ophthalmology;  Laterality: Left;  [place] 00:35 AP% 22.9 CDE 8.11 fluid pack lot [id number]  . CATARACT EXTRACTION W/PHACO Right 06/04/2015   Procedure: CATARACT EXTRACTION PHACO AND INTRAOCULAR LENS PLACEMENT (IOC);  Surgeon: Birder Robson, MD;  Location: [facility];  Service: Ophthalmology;  Laterality: Right;  US:00:48 AP%:  10.5 CDE:5.08 Fluid lot [id number]  . CYSTOSCOPY/URETEROSCOPY/HOLMIUM LASER/STENT PLACEMENT Bilateral 09/22/2016   Procedure: CYSTOSCOPY/URETEROSCOPY/HOLMIUM LASER/STENT PLACEMENT;  Surgeon: Hollice Espy, MD;  Location: [facility];  Service: Urology;  Laterality: Bilateral;  . EYE SURGERY  2015   tissue biopsy  . FOOT SURGERY  2015  . JOINT REPLACEMENT Left 2013   hip replacement  . LITHOTRIPSY    . PTOSIS REPAIR Bilateral 02/18/2016   Procedure: BILATERAL PTOSIS REPAIR UPPER EYELIDS;  Surgeon: Karle Starch, MD;  Location: [facility];  Service: Ophthalmology;  Laterality: Bilateral;  LEAVE PT EARLY AM  . thumb surgery Right   . TONSILLECTOMY  1973    Family Psychiatric History:   Family History:  Family History  Problem Relation Age of Onset  . Cancer Father        Abdomen with mastasis  . Cancer Mother   . Heart disease Mother   . Kidney disease Neg Hx   . Bladder Cancer Neg Hx   . Prostate cancer Neg Hx   . Kidney cancer Neg Hx     Social History:   Social History   Socioeconomic History  . Marital status: Divorced    Spouse name: Not on file  . Number of children: 1  . Years of education: Not on file  . Highest education level: Not on file  Occupational History  . 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 Smoking status: Former Smoker    Years: 25.00    Types: Cigarettes    Quit date: 11/16/2012    Years since quitting: 7.1  . Smokeless tobacco: Never Used  . Tobacco comment: occasional use  Substance and Sexual Activity  . Alcohol use: No    Alcohol/week: 0.0 standard drinks  . Drug use: No    Types: Methylphenidate  . Sexual activity: Never  Other Topics Concern  . Not on file  Social History Narrative  . Not on file   Social Determinants of Health   Financial Resource Strain:   . Difficulty of Paying Living Expenses: Not on file  Food Insecurity:   . Worried About Charity fundraiser in the Last Year:  Not on file  . Ran Out of Food in the Last Year: Not on file  Transportation Needs:   . Lack of Transportation (Medical): Not on file  . Lack of Transportation (Non-Medical): Not on file  Physical Activity:   . Days of Exercise per Week: Not on file  . Minutes of Exercise per Session: Not on file  Stress:   . Feeling of Stress : Not on file  Social Connections:   . Frequency of Communication with Friends and Family: Not on file  .  Frequency of Social Gatherings with Friends and Family: Not on file  . Attends Religious Services: Not on file  . Active Member of Clubs or Organizations: Not on file  . Attends Archivist Meetings: Not on file  . Marital Status: Not on file    Additional Social History:   Allergies:   Allergies  Allergen Reactions  . Albuterol Shortness Of Breath and Other (See Comments)    Makes pt feel jittery/ tacycardic  . Crestor [Rosuvastatin] Other (See Comments)    Joint pain, muscle pain, and hair loss  . Halcion [Triazolam] Other (See Comments)    Dizziness,headaches,bladder problems  . Levaquin [Levofloxacin In D5w] Diarrhea and Itching    Shoulder pain  . Naproxen Sodium Swelling    Patient tolerates in small doses  . Tylenol [Acetaminophen] Swelling    Patient tolerates in small doses  . Cefaclor Other (See Comments)    Doesn't remember---unsure if actually allergic   . Diclofenac Sodium Other (See Comments)    "made very sick"  . Sulfa Antibiotics Itching    Unsure of reaction possibly itching  . Tramadol Itching and Nausea And Vomiting  . Aripiprazole Other (See Comments)    Muscle tension/cramping  . Ibuprofen Swelling    Patient tolerates in small doses    Metabolic Disorder Labs: Lab Results  Component Value Date   HGBA1C 5.9 08/15/2019   No results found for: PROLACTIN Lab Results  Component Value Date   CHOL 353 (H) 05/10/2019   TRIG 210.0 (H) 05/10/2019   HDL 61.20 05/10/2019   CHOLHDL 6 05/10/2019   VLDL 42.0 (H)  05/10/2019   LDLCALC 223 (H) 11/13/2014     Current Medications: Current Outpatient Medications  Medication Sig Dispense Refill  . ASPERCREME LIDOCAINE EX Apply 1 application topically 4 (four) times daily as needed (for pain.).     Marland Kitchen aspirin 325 MG tablet Take 325 mg by mouth every 4 (four) hours as needed for headache.     Marland Kitchen atorvastatin (LIPITOR) 20 MG tablet TAKE 1 TABLET BY MOUTH EVERY DAY 90 tablet 0  . Azelastine HCl 137 MCG/SPRAY SOLN PLACE 2 SPRAYS INTO BOTH NOSTRILS 2 (TWO) TIMES DAILY 90 mL 0  . BIOTIN PO Take 200 mg by mouth daily.     . Cholecalciferol (VITAMIN D3) 10000 units capsule Take 30,000 Units by mouth daily.     . ciclopirox (LOPROX) 0.77 % cream Apply topically 2 (two) times daily. 30 g 0  . cyanocobalamin (,VITAMIN B-12,) 1000 MCG/ML injection Inject 1 mL (1,000 mcg total) into the muscle every 3 (three) months. 1 mL 0  . desipramine (NORPRAMIN) 25 MG tablet 1  qam 30 tablet 3  . diclofenac sodium (VOLTAREN) 1 % GEL Apply 4 g topically 4 (four) times daily. 500 g 5  . docusate sodium (COLACE) 100 MG capsule Take 1 capsule (100 mg total) by mouth 2 (two) times daily. 60 capsule 0  . DULoxetine (CYMBALTA) 20 MG capsule Take 2 capsules (40 mg total) by mouth daily. 60 capsule 5  . Eszopiclone (ESZOPICLONE) 3 MG TABS Take 1 tablet (3 mg total) by mouth at bedtime as needed. Take immediately before bedtime 30 tablet 4  . HYDROcodone-acetaminophen (NORCO) 10-325 MG tablet Take 1 tablet by mouth every 8 (eight) hours as needed for moderate pain or severe pain. Supervising physician; Dr Eliezer Lofts; DEA# [id number] 90 tablet 0  . hydroquinone 4 % cream 1 APPLICATION TOPICALLY DAILY AS NEEDED FOR BLEMISHES.  3  .  hydrOXYzine (VISTARIL) 50 MG capsule TAKE 1 CAPSLE BY MOUTH AT NOON, 1 AT 6 PM, AND 1 AS NEEDED 270 capsule 2  . ketorolac (TORADOL) 10 MG tablet Take 1 tablet (10 mg total) by mouth every 8 (eight) hours as needed. 30 tablet 1  . lamoTRIgine (LAMICTAL) 150 MG tablet  Take 2 tablets (300 mg total) by mouth at bedtime. 60 tablet 5  . levalbuterol (XOPENEX HFA) 45 MCG/ACT inhaler Inhale 1-2 puffs into the lungs every 6 (six) hours as needed for wheezing. 1 Inhaler 0  . levalbuterol (XOPENEX) 1.25 MG/3ML nebulizer solution USE 1 VIAL VIA NEBULIZER EVERY 4 HOURS AS NEEDED FOR WHEEZING OR SHORTNESS OF BREATH 270 mL 1  . Lysine 1000 MG TABS Take 2,000-4,000 mg by mouth at bedtime. 2000 mg scheduled at bedtime and patient will take 4000 mg if she has outbreak    . Magnesium 500 MG TABS Take 1 tablet by mouth at bedtime.    Marland Kitchen neomycin-bacitracin-polymyxin (NEOSPORIN) [id number] ointment Apply 1 application topically 4 (four) times daily as needed (for cut/scrapes.).    Marland Kitchen pantoprazole (PROTONIX) 40 MG tablet TAKE 1 TABLET BY MOUTH EVERY DAY 90 tablet 1  . phenazopyridine (AZO-TABS) 95 MG tablet Take 95 mg by mouth 3 (three) times daily as needed for pain.    . polyethylene glycol powder (GLYCOLAX/MIRALAX) powder MIX 17 GRAMS (1 CAPFUL) WITH 4-8 OZ OF LIQUID AND TAKE BY MOUTH TWICE DAILY AS NEEDED 527 g 0  . temazepam (RESTORIL) 30 MG capsule TAKE ONE TO TWO CAPSULES BY MOUTH NIGHTLY AT BEDTIME 60 capsule 4  . tiZANidine (ZANAFLEX) 4 MG tablet TAKE 2 TABLETS (8 MG TOTAL) BY MOUTH EVERY 6 (SIX) HOURS AS NEEDED FOR MUSCLE SPASMS. 240 tablet 0  . traZODone (DESYREL) 50 MG tablet Take 2 tablets (100 mg total) by mouth at bedtime. 60 tablet 7  . triamcinolone ointment (KENALOG) 0.5 % Apply 1 application topically 2 (two) times daily. 30 g 0  . valACYclovir (VALTREX) 1000 MG tablet Take 2 tablets by mouth twice daily for one day. 4 tablet 0   No current facility-administered medications for this visit.    Neurologic: Headache: No Seizure: No Paresthesias:No  Musculoskeletal: Strength & Muscle Tone: within normal limits Gait & Station: normal Patient leans: N/A  Psychiatric Specialty Exam: ROS  Last menstrual period 08/23/2014.There is no height or weight on file to  calculate BMI.  General Appearance: Casual  Eye Contact:  Good  Speech:  Clear and Coherent  Volume:  Normal  Mood:  Negative  Affect:  Appropriate  Thought Process:  Goal Directed  Orientation:  NA  Thought Content:  Logical  Suicidal Thoughts:  No  Homicidal Thoughts:  No  Memory:  Negative  Judgement:  Good  Insight:  Good  Psychomotor Activity:  Normal  Concentration:    Recall:    Fund of Knowledge:Good  Language: Good  Akathisia:  No  Handed:  Right  AIMS (if indicated):    Assets:  Desire for Improvement  ADL's:  Intact  Cognition: WNL  Sleep:      2/17/20212:10 PM   This patient's first problem which is worsened is major depression.  She takes Cymbalta 40 mg and cannot tolerate a higher dose.  Today we will begin her on 25 mg of desipramine in the morning.  Her second problem is that of insomnia.  Patient has problems falling off to sleep.  At this time we will begin her on Lunesta in the morning to  try to get her into a more regular sleep pattern.  Patient will continue taking Lamictal 300 mg.  Patient is not suicidal.  We will speak again in 2 months.  If she is not better we will get a desipramine blood level.  By then believe this pandemic will be less of an issue as she will have dental surgery and hopefully things will be straightened out a little bit with her son and his problems.  The patient also says that she needs to be in talking therapy and she will make an effort to make a contact with someone who she worked with in the past.

## 2020-01-04 ENCOUNTER — Other Ambulatory Visit: Payer: PPO

## 2020-01-05 ENCOUNTER — Ambulatory Visit: Payer: PPO | Admitting: Primary Care

## 2020-01-09 ENCOUNTER — Telehealth (HOSPITAL_COMMUNITY): Payer: Self-pay

## 2020-01-09 NOTE — Telephone Encounter (Signed)
I spoke with patient and relayed the message from the doctor to discontinue taking Temazepam 30mg  and continue taking Eszopiclone 3mg  (per our conversation). The patient stated that she doesn't want to stop taking the Temazepam. The Eszopiclone costs her $90 so she stated that she would like to replace that with Zolpidem (Ambien). Please review and advise. Thank you.

## 2020-01-10 ENCOUNTER — Encounter: Payer: PPO | Admitting: Primary Care

## 2020-01-11 DIAGNOSIS — S71102A Unspecified open wound, left thigh, initial encounter: Secondary | ICD-10-CM | POA: Diagnosis not present

## 2020-01-11 DIAGNOSIS — Z872 Personal history of diseases of the skin and subcutaneous tissue: Secondary | ICD-10-CM | POA: Diagnosis not present

## 2020-01-11 DIAGNOSIS — L710 Perioral dermatitis: Secondary | ICD-10-CM | POA: Diagnosis not present

## 2020-01-11 DIAGNOSIS — L309 Dermatitis, unspecified: Secondary | ICD-10-CM | POA: Diagnosis not present

## 2020-01-11 DIAGNOSIS — L219 Seborrheic dermatitis, unspecified: Secondary | ICD-10-CM | POA: Diagnosis not present

## 2020-01-11 DIAGNOSIS — D1801 Hemangioma of skin and subcutaneous tissue: Secondary | ICD-10-CM | POA: Diagnosis not present

## 2020-01-11 DIAGNOSIS — L304 Erythema intertrigo: Secondary | ICD-10-CM | POA: Diagnosis not present

## 2020-01-17 ENCOUNTER — Telehealth: Payer: Self-pay

## 2020-01-17 ENCOUNTER — Ambulatory Visit: Payer: PPO | Admitting: Internal Medicine

## 2020-01-17 ENCOUNTER — Ambulatory Visit (INDEPENDENT_AMBULATORY_CARE_PROVIDER_SITE_OTHER): Payer: PPO | Admitting: Gastroenterology

## 2020-01-17 DIAGNOSIS — R1310 Dysphagia, unspecified: Secondary | ICD-10-CM

## 2020-01-17 DIAGNOSIS — R1319 Other dysphagia: Secondary | ICD-10-CM

## 2020-01-17 DIAGNOSIS — F310 Bipolar disorder, current episode hypomanic: Secondary | ICD-10-CM

## 2020-01-17 NOTE — Progress Notes (Signed)
Emily Moon , MD [address]  [place]  [place], [place] [address]  Main: [phone]  Fax: [phone]   Primary Care Physician: [name], NP  Virtual Visit via Video Note  I connected with patient on 01/17/20 at  9:30 AM EST by video and verified that I am speaking with the correct person using two identifiers.   I discussed the limitations, risks, security and privacy concerns of performing an evaluation and management service by video  and the availability of in person appointments. I also discussed with the patient that there may be a patient responsible charge related to this service. The patient expressed understanding and agreed to proceed.  Location of Patient: Home Location of Provider: Home Persons involved: Patient and provider only   History of Present Illness:   Dysphagia follow-up.  HPI: Emily Moon is a 59 y.o. female   Summary of history :  Was initially referred and seen in October 2020 for difficulty swallowing and choking.  She has a history of multiple sclerosis diagnosed in 73.  Prior to her initial visit over the past prior months had not really noticed any progression but had difficulty pushing the food to the back of her throat.  Once it reaches the back of her throat it went down fine.  At that time she was in the midst of a flare of her multiple sclerosis.Affect liquids more often than solids.  No episodes of impaction.  She probably has history of acid reflux.  She does say that acid comes up in her throat when she lies flat.  She suffers from sleep apnea and cannot tolerate CPAP.  Denies any prior EGD.  She is on a PPI but does not take it regularly.   Interval history   09/07/2019-01/17/2020  11/01/2019 barium swallow with tablet showed single episode of distal esophageal spasm otherwise normal study. 11/23/2019: Modified barium swallow.  Flash laryngeal penetration without tracheal aspiration with thin liquids otherwise  normal.  No special oropharyngeal precautions were indicated by the speech therapist.  Safe oropharyngeal swallowing was noted.   On PPI in the morning and taking famotidine twice a day.  Her multiple sclerosis is no better than what it was previously.  If at all probably a bit worse.  She has further gained weight.  Sometimes feels like a spasm in the center of her chest.  Current Outpatient Medications  Medication Sig Dispense Refill  . ASPERCREME LIDOCAINE EX Apply 1 application topically 4 (four) times daily as needed (for pain.).     Marland Kitchen aspirin 325 MG tablet Take 325 mg by mouth every 4 (four) hours as needed for headache.     Marland Kitchen atorvastatin (LIPITOR) 20 MG tablet TAKE 1 TABLET BY MOUTH EVERY DAY 90 tablet 0  . Azelastine HCl 137 MCG/SPRAY SOLN PLACE 2 SPRAYS INTO BOTH NOSTRILS 2 (TWO) TIMES DAILY 90 mL 0  . BIOTIN PO Take 200 mg by mouth daily.     . Cholecalciferol (VITAMIN D3) 10000 units capsule Take 30,000 Units by mouth daily.     . ciclopirox (LOPROX) 0.77 % cream Apply topically 2 (two) times daily. 30 g 0  . cyanocobalamin (,VITAMIN B-12,) 1000 MCG/ML injection Inject 1 mL (1,000 mcg total) into the muscle every 3 (three) months. 1 mL 0  . desipramine (NORPRAMIN) 25 MG tablet 1  qam 30 tablet 3  . diclofenac sodium (VOLTAREN) 1 % GEL Apply 4 g topically 4 (four) times daily. 500 g  5  . docusate sodium (COLACE) 100 MG capsule Take 1 capsule (100 mg total) by mouth 2 (two) times daily. 60 capsule 0  . DULoxetine (CYMBALTA) 20 MG capsule Take 2 capsules (40 mg total) by mouth daily. 60 capsule 5  . Eszopiclone (ESZOPICLONE) 3 MG TABS Take 1 tablet (3 mg total) by mouth at bedtime as needed. Take immediately before bedtime 30 tablet 4  . HYDROcodone-acetaminophen (NORCO) 10-325 MG tablet Take 1 tablet by mouth every 8 (eight) hours as needed for moderate pain or severe pain. Supervising physician; Dr Eliezer Lofts; DEA# [id number] 90 tablet 0  . hydroquinone 4 % cream 1 APPLICATION  TOPICALLY DAILY AS NEEDED FOR BLEMISHES.  3  . hydrOXYzine (VISTARIL) 50 MG capsule TAKE 1 CAPSLE BY MOUTH AT NOON, 1 AT 6 PM, AND 1 AS NEEDED 270 capsule 2  . ketorolac (TORADOL) 10 MG tablet Take 1 tablet (10 mg total) by mouth every 8 (eight) hours as needed. 30 tablet 1  . lamoTRIgine (LAMICTAL) 150 MG tablet Take 2 tablets (300 mg total) by mouth at bedtime. 60 tablet 5  . levalbuterol (XOPENEX HFA) 45 MCG/ACT inhaler Inhale 1-2 puffs into the lungs every 6 (six) hours as needed for wheezing. 1 Inhaler 0  . levalbuterol (XOPENEX) 1.25 MG/3ML nebulizer solution USE 1 VIAL VIA NEBULIZER EVERY 4 HOURS AS NEEDED FOR WHEEZING OR SHORTNESS OF BREATH 270 mL 1  . Lysine 1000 MG TABS Take 2,000-4,000 mg by mouth at bedtime. 2000 mg scheduled at bedtime and patient will take 4000 mg if she has outbreak    . Magnesium 500 MG TABS Take 1 tablet by mouth at bedtime.    Marland Kitchen neomycin-bacitracin-polymyxin (NEOSPORIN) [phone] ointment Apply 1 application topically 4 (four) times daily as needed (for cut/scrapes.).    Marland Kitchen pantoprazole (PROTONIX) 40 MG tablet TAKE 1 TABLET BY MOUTH EVERY DAY 90 tablet 1  . phenazopyridine (AZO-TABS) 95 MG tablet Take 95 mg by mouth 3 (three) times daily as needed for pain.    . polyethylene glycol powder (GLYCOLAX/MIRALAX) powder MIX 17 GRAMS (1 CAPFUL) WITH 4-8 OZ OF LIQUID AND TAKE BY MOUTH TWICE DAILY AS NEEDED 527 g 0  . temazepam (RESTORIL) 30 MG capsule TAKE ONE TO TWO CAPSULES BY MOUTH NIGHTLY AT BEDTIME 60 capsule 4  . tiZANidine (ZANAFLEX) 4 MG tablet TAKE 2 TABLETS (8 MG TOTAL) BY MOUTH EVERY 6 (SIX) HOURS AS NEEDED FOR MUSCLE SPASMS. 240 tablet 0  . traZODone (DESYREL) 50 MG tablet Take 2 tablets (100 mg total) by mouth at bedtime. 60 tablet 7  . triamcinolone ointment (KENALOG) 0.5 % Apply 1 application topically 2 (two) times daily. 30 g 0  . valACYclovir (VALTREX) 1000 MG tablet Take 2 tablets by mouth twice daily for one day. 4 tablet 0   No current  facility-administered medications for this visit.    Allergies as of 01/17/2020 - Review Complete 11/28/2019  Allergen Reaction Noted  . Albuterol Shortness Of Breath and Other (See Comments) 11/12/2015  . Crestor [rosuvastatin] Other (See Comments) 03/10/2017  . Halcion [triazolam] Other (See Comments) 02/12/2016  . Levaquin [levofloxacin in d5w] Diarrhea and Itching 12/02/2015  . Naproxen sodium Swelling 08/28/2010  . Tylenol [acetaminophen] Swelling 08/15/2013  . Cefaclor Other (See Comments) 08/28/2010  . Diclofenac sodium Other (See Comments) 08/28/2010  . Sulfa antibiotics Itching 05/16/2015  . Tramadol Itching and Nausea And Vomiting 08/18/2016  . Aripiprazole Other (See Comments) 08/28/2010  . Ibuprofen Swelling 08/28/2010    Review of Systems:  All systems reviewed and negative except where noted in HPI.  General Appearance:    Alert, cooperative, no distress, appears stated age  Head:    Normocephalic, without obvious abnormality, atraumatic  Eyes:    PERRL, conjunctiva/corneas clear,  Ears:    Grossly normal hearing    Neurologic:  Grossly normal    Observations/Objective:  Labs: CMP     Component Value Date/Time   NA 138 05/10/2019 1434   K 4.6 05/10/2019 1434   CL 102 05/10/2019 1434   CO2 24 05/10/2019 1434   GLUCOSE 119 (H) 05/10/2019 1434   BUN 20 05/10/2019 1434   CREATININE 0.73 05/10/2019 1434   CALCIUM 9.2 05/10/2019 1434   PROT 6.3 11/24/2018 0826   ALBUMIN 4.0 11/24/2018 0826   AST 10 11/24/2018 0826   ALT 9 11/24/2018 0826   ALKPHOS 64 11/24/2018 0826   BILITOT 0.3 11/24/2018 0826   GFRNONAA >60 10/13/2016 0446   GFRAA >60 10/13/2016 0446   Lab Results  Component Value Date   WBC 6.3 08/15/2019   HGB 11.2 Repeated and verified X2. (L) 08/15/2019   HCT 33.4 (L) 08/15/2019   MCV 95.4 08/15/2019   PLT 272.0 08/15/2019    Imaging Studies: No results found.  Assessment and Plan:   CANDE HAMMERMAN is a 59 y.o. y/o female here to  follow-up for dysphagia.  She has a history of multiple sclerosis.  No real obstruction seen on barium esophagogram and no transfer dysphagia noted on modified barium swallow.  Possible esophageal spasm.  Her multiple sclerosis is not in remission.  Do not see a real strong reason to perform an endoscopy evaluation in the midst of her multiple sclerosis not being well controlled as it can put her at higher risk of complications from anesthesia.  Discussed conservative management in terms of avoiding foods which are too hot or too cold.  Having small portions spaced out in time accompanied by fluids.  She does eat rather fast.  And I explained to her that she would need to wait for each peristaltic wave to pass before she eats yet again.  I will give her a call in 4 months time to check on how she is doing.  Continue PPI at this moment of time.     I discussed the assessment and treatment plan with the patient. The patient was provided an opportunity to ask questions and all were answered. The patient agreed with the plan and demonstrated an understanding of the instructions.   The patient was advised to call back or seek an in-person evaluation if the symptoms worsen or if the condition fails to improve as anticipated.  I provided 15 minutes of face-to-face time during this encounter.  Dr Emily Bellows MD,MRCP [facility]) Gastroenterology/Hepatology Pager: [phone]   Speech recognition software was used to dictate this note.

## 2020-01-17 NOTE — Telephone Encounter (Signed)
Medication refill request - Fax from pt's [facility] with a 90 day refill request for her Desipramine. Insurance will cover 90 days at a 30 day cost.

## 2020-01-18 ENCOUNTER — Other Ambulatory Visit: Payer: PPO

## 2020-01-19 ENCOUNTER — Other Ambulatory Visit: Payer: Self-pay | Admitting: Primary Care

## 2020-01-19 DIAGNOSIS — E559 Vitamin D deficiency, unspecified: Secondary | ICD-10-CM

## 2020-01-19 DIAGNOSIS — E785 Hyperlipidemia, unspecified: Secondary | ICD-10-CM

## 2020-01-19 DIAGNOSIS — E538 Deficiency of other specified B group vitamins: Secondary | ICD-10-CM

## 2020-01-19 DIAGNOSIS — R7303 Prediabetes: Secondary | ICD-10-CM

## 2020-01-19 MED ORDER — DESIPRAMINE HCL 25 MG PO TABS: ORAL_TABLET | ORAL | 0 refills | Status: DC

## 2020-01-19 NOTE — Telephone Encounter (Signed)
A new 90 day order for patient's Desipramine 25 mg, one each morning e-scribed to patient's [facility] in [employer] in [place] per verbal authorization from Dr. Casimiro Needle.

## 2020-01-22 ENCOUNTER — Ambulatory Visit: Payer: PPO | Admitting: Family Medicine

## 2020-01-22 ENCOUNTER — Other Ambulatory Visit: Payer: Self-pay

## 2020-01-22 ENCOUNTER — Other Ambulatory Visit (INDEPENDENT_AMBULATORY_CARE_PROVIDER_SITE_OTHER): Payer: PPO

## 2020-01-22 DIAGNOSIS — E538 Deficiency of other specified B group vitamins: Secondary | ICD-10-CM

## 2020-01-22 DIAGNOSIS — E785 Hyperlipidemia, unspecified: Secondary | ICD-10-CM | POA: Diagnosis not present

## 2020-01-22 DIAGNOSIS — R7303 Prediabetes: Secondary | ICD-10-CM | POA: Diagnosis not present

## 2020-01-22 DIAGNOSIS — E559 Vitamin D deficiency, unspecified: Secondary | ICD-10-CM

## 2020-01-22 NOTE — Progress Notes (Deleted)
     Emily Moon T. Emily Rabinovich, MD Primary Care and Sports Medicine [facility] at [facility] [place], [address] Phone: [phone]  FAX: [place] - 59 y.o. female  MRN [id number]  Date of Birth: 1961-02-28  Visit Date: 01/22/2020  PCP: Emily Koch, NP  Referred by: Emily Koch, NP  No chief complaint on file.   This visit occurred during the SARS-CoV-2 public health emergency.  Safety protocols were in place, including screening questions prior to the visit, additional usage of staff PPE, and extensive cleaning of exam room while observing appropriate contact time as indicated for disinfecting solutions.   Subjective:   Emily Moon is a 60 y.o. very pleasant female patient with There is no height or weight on file to calculate BMI. who presents with the following:  F/u knee pain and back pain.    Review of Systems is noted in the HPI, as appropriate   Objective:   LMP 08/23/2014   ***  Radiology: No results found.  Assessment and Plan:   ***

## 2020-01-23 LAB — LIPID PANEL
Cholesterol: 367 mg/dL — ABNORMAL HIGH (ref 0–200)
HDL: 52.6 mg/dL (ref >39.00)
NonHDL: 314.67
Total CHOL/HDL Ratio: 7
Triglycerides: 298 mg/dL — ABNORMAL HIGH (ref 0.0–149.0)
VLDL: 59.6 mg/dL — ABNORMAL HIGH (ref 0.0–40.0)

## 2020-01-23 LAB — COMPREHENSIVE METABOLIC PANEL
ALT: 16 U/L (ref 0–35)
AST: 12 U/L (ref 0–37)
Albumin: 4.4 g/dL (ref 3.5–5.2)
Alkaline Phosphatase: 73 U/L (ref 39–117)
BUN: 18 mg/dL (ref 6–23)
CO2: 30 mEq/L (ref 19–32)
Calcium: 9.2 mg/dL (ref 8.4–10.5)
Chloride: 99 mEq/L (ref 96–112)
Creatinine, Ser: 0.91 mg/dL (ref 0.40–1.20)
GFR: 63.54 mL/min (ref >60.00)
Glucose, Bld: 123 mg/dL — ABNORMAL HIGH (ref 70–99)
Potassium: 4.2 mEq/L (ref 3.5–5.1)
Sodium: 139 mEq/L (ref 135–145)
Total Bilirubin: 0.4 mg/dL (ref 0.2–1.2)
Total Protein: 6.9 g/dL (ref 6.0–8.3)

## 2020-01-23 LAB — CBC
HCT: 41.6 % (ref 36.0–46.0)
Hemoglobin: 13.9 g/dL (ref 12.0–15.0)
MCHC: 33.5 g/dL (ref 30.0–36.0)
MCV: 94.1 fl (ref 78.0–100.0)
Platelets: 336 10*3/uL (ref 150.0–400.0)
RBC: 4.42 Mil/uL (ref 3.87–5.11)
RDW: 13.3 % (ref 11.5–15.5)
WBC: 9.6 10*3/uL (ref 4.0–10.5)

## 2020-01-23 LAB — LDL CHOLESTEROL, DIRECT: Direct LDL: 255 mg/dL

## 2020-01-23 LAB — VITAMIN B12: Vitamin B-12: 213 pg/mL (ref 211–911)

## 2020-01-23 LAB — HEMOGLOBIN A1C: Hgb A1c MFr Bld: 6.4 % (ref 4.6–6.5)

## 2020-01-23 LAB — VITAMIN D 25 HYDROXY (VIT D DEFICIENCY, FRACTURES): VITD: 75.66 ng/mL (ref 30.00–100.00)

## 2020-01-24 ENCOUNTER — Ambulatory Visit (INDEPENDENT_AMBULATORY_CARE_PROVIDER_SITE_OTHER): Payer: PPO | Admitting: Primary Care

## 2020-01-24 ENCOUNTER — Other Ambulatory Visit: Payer: Self-pay

## 2020-01-24 ENCOUNTER — Encounter: Payer: Self-pay | Admitting: Primary Care

## 2020-01-24 VITALS — BP 124/84 | HR 125 | Temp 96.1°F | Ht 69.0 in | Wt 235.2 lb

## 2020-01-24 DIAGNOSIS — K219 Gastro-esophageal reflux disease without esophagitis: Secondary | ICD-10-CM

## 2020-01-24 DIAGNOSIS — M1631 Unilateral osteoarthritis resulting from hip dysplasia, right hip: Secondary | ICD-10-CM

## 2020-01-24 DIAGNOSIS — E785 Hyperlipidemia, unspecified: Secondary | ICD-10-CM

## 2020-01-24 DIAGNOSIS — Z1231 Encounter for screening mammogram for malignant neoplasm of breast: Secondary | ICD-10-CM

## 2020-01-24 DIAGNOSIS — J4520 Mild intermittent asthma, uncomplicated: Secondary | ICD-10-CM

## 2020-01-24 DIAGNOSIS — G35 Multiple sclerosis: Secondary | ICD-10-CM

## 2020-01-24 DIAGNOSIS — E559 Vitamin D deficiency, unspecified: Secondary | ICD-10-CM

## 2020-01-24 DIAGNOSIS — Z720 Tobacco use: Secondary | ICD-10-CM

## 2020-01-24 DIAGNOSIS — R7303 Prediabetes: Secondary | ICD-10-CM

## 2020-01-24 DIAGNOSIS — G894 Chronic pain syndrome: Secondary | ICD-10-CM

## 2020-01-24 DIAGNOSIS — M159 Polyosteoarthritis, unspecified: Secondary | ICD-10-CM

## 2020-01-24 DIAGNOSIS — Z1211 Encounter for screening for malignant neoplasm of colon: Secondary | ICD-10-CM | POA: Diagnosis not present

## 2020-01-24 DIAGNOSIS — J3089 Other allergic rhinitis: Secondary | ICD-10-CM

## 2020-01-24 DIAGNOSIS — G4733 Obstructive sleep apnea (adult) (pediatric): Secondary | ICD-10-CM | POA: Diagnosis not present

## 2020-01-24 DIAGNOSIS — Z0000 Encounter for general adult medical examination without abnormal findings: Secondary | ICD-10-CM | POA: Diagnosis not present

## 2020-01-24 DIAGNOSIS — R21 Rash and other nonspecific skin eruption: Secondary | ICD-10-CM

## 2020-01-24 DIAGNOSIS — G4700 Insomnia, unspecified: Secondary | ICD-10-CM

## 2020-01-24 DIAGNOSIS — F3170 Bipolar disorder, currently in remission, most recent episode unspecified: Secondary | ICD-10-CM

## 2020-01-24 NOTE — Assessment & Plan Note (Addendum)
Following with neurology.  No changes in treatment. She has yet to complete the MRI that was requested in late 2020 per neurology.  I requested that she call her neurology clinic and inquire about how to get the MRI completed as requested.  We discussed the importance of taking charge of her own health care and completing the recommended tests.

## 2020-01-24 NOTE — Assessment & Plan Note (Signed)
Following with pulmonology, no changes.

## 2020-01-24 NOTE — Assessment & Plan Note (Signed)
Quit smoking nearly 2 years ago, commended her for this.  No recent vaping.

## 2020-01-24 NOTE — Assessment & Plan Note (Signed)
Chronic and continued. No longer on opioids, following with pain management.  She did ask for a refill of 4 hydrocodone for which I refused.  And no longer will prescribe her narcotics.  She verbalized understanding.  She will be seeing sports medicine tomorrow

## 2020-01-24 NOTE — Assessment & Plan Note (Signed)
Following now with GI, recent evaluation for esophageal dysphagia. Continue PPI and H2 blocker.

## 2020-01-24 NOTE — Assessment & Plan Note (Signed)
No longer on opioid therapy, following with pain management.  She did ask for refill of her hydrocodone for which I declined.  I discussed that I will no longer fill her narcotics.  She verbalized understanding.

## 2020-01-24 NOTE — Assessment & Plan Note (Signed)
She seems uncontrolled, upset today and wanting to "give up" on her medical care.  We had a very frank discussion about the need for her to make that positive choice to take initiative of her health care in order to improve symptoms/conditions.  She denies SI/HI.  Following with psychiatry.  Continue current regimen.

## 2020-01-24 NOTE — Assessment & Plan Note (Signed)
Now following with dermatology, on several creams. Also on doxycycline.

## 2020-01-24 NOTE — Patient Instructions (Addendum)
Call the neurologist's office to get your MRI scheduled.  Call your pain doctor for a follow up visit.  You will be contacted regarding your referral to the lipid clinic for cholesterol and GI for the colonoscopy.  Please let us know if you have not been contacted within two weeks.   Try to eat a better diet. You will be contacted regarding your referral to the nutritionist.  Please let us know if you have not been contacted within two weeks.   Call the [facility] to schedule your mammogram.   Ensure you are consuming 64 ounces of water daily.  It was a pleasure to see you today!

## 2020-01-24 NOTE — Progress Notes (Signed)
Subjective:    Patient ID: Emily Moon, female    DOB: [birth year], 59 y.o.   MRN: [id number]  HPI  This visit occurred during the SARS-CoV-2 public health emergency.  Safety protocols were in place, including screening questions prior to the visit, additional usage of staff PPE, and extensive cleaning of exam room while observing appropriate contact time as indicated for disinfecting solutions.    Emily Moon is a 59 year old female who presents today for complete physical.  Immunizations: -Influenza: Completed this season -Shingles: Never completed  -Pneumonia: Completed Prevnar in 2015, Pneumovax in 2016  Diet: Poor diet, rice, junk food, some fast food but tries to make healthy choice.  Exercise: No exercise due to chronic pain.   Pap smear:  Completed in 2019 Mammogram: No recent exam. Colonoscopy: Never completed. Saw GI last week.  Hep C Screen: Negative   BP Readings from Last 3 Encounters:  01/24/20 124/84  11/02/19 118/76  08/24/19 118/78     Review of Systems  Constitutional: Negative for unexpected weight change.  Eyes: Negative for visual disturbance.  Cardiovascular: Negative for chest pain.  Gastrointestinal: Negative for constipation and diarrhea.  Genitourinary: Negative for difficulty urinating.  Musculoskeletal: Positive for arthralgias and myalgias.  Skin: Positive for rash.       Intermittent rashes, following with dermatology  Allergic/Immunologic: Positive for environmental allergies.  Neurological: Positive for dizziness.  Psychiatric/Behavioral: The patient is nervous/anxious.        Active depression, following with psychiatry.        Past Medical History:  Diagnosis Date  . Arthritis    osteo  . Asthma   . Bipolar disorder ([employer]) 05/21/14  . Cataracts, bilateral   . DDD (degenerative disc disease), cervical    also back  . Depression   . Dizziness    Positional  . Edema    feet/legs  . Fibromyalgia syndrome   . Fungal  infection    Finger nails  . GERD (gastroesophageal reflux disease)   . Gout   . Headache    seasonal allergies  . Heart palpitations   . 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 Hypercholesterolemia   . Multiple sclerosis (HCC)    weakness  . Nephrolithiasis    kidney stones  . Osteoporosis    osteoarthritis  . Pneumonia   . PONV (postoperative nausea and vomiting)    no problem after cataract surgery  . Psoriasis   . Renal stone   . Shortness of breath dyspnea    wheezing  . Sleep apnea 2012   sleep study / slight, no interventions  . Urinary frequency      Social History   Socioeconomic History  . Marital status: Divorced    Spouse name: Not on file  . Number of children: 1  . Years of education: Not on file  . Highest education level: Not on file  Occupational History  . 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 Smoking status: Former Smoker    Years: 25.00    Types: Cigarettes    Quit date: 11/16/2012    Years since quitting: 7.1  . Smokeless tobacco: Never Used  . Tobacco comment: occasional use  Substance and Sexual Activity  . Alcohol use: No    Alcohol/week: 0.0 standard drinks  . Drug use: No    Types: Methylphenidate  . Sexual activity: Never  Other Topics Concern  . Not on file  Social History Narrative  . Not on file   Social Determinants of Health   Financial Resource Strain:   . Difficulty of Paying Living Expenses: Not on file  Food Insecurity:   . Worried About Charity fundraiser in the Last Year: Not on file  . Ran Out of Food in the Last Year: Not on file  Transportation Needs:   . Lack of Transportation (Medical): Not on file  . Lack of Transportation (Non-Medical): Not on file  Physical Activity:   . Days of Exercise per Week: Not on file  . Minutes of Exercise per Session: Not on file  Stress:   . Feeling of Stress : Not on file  Social Connections:   . Frequency of Communication with  Friends and Family: Not on file  . Frequency of Social Gatherings with Friends and Family: Not on file  . Attends Religious Services: Not on file  . Active Member of Clubs or Organizations: Not on file  . Attends Archivist Meetings: Not on file  . Marital Status: Not on file  Intimate Partner Violence:   . Fear of Current or Ex-Partner: Not on file  . Emotionally Abused: Not on file  . Physically Abused: Not on file  . Sexually Abused: Not on file    Past Surgical History:  Procedure Laterality Date  . CATARACT EXTRACTION W/PHACO Left 05/21/2015   Procedure: CATARACT EXTRACTION PHACO AND INTRAOCULAR LENS PLACEMENT (IOC);  Surgeon: Birder Robson, MD;  Location: [facility];  Service: Ophthalmology;  Laterality: Left;  [place] 00:35 AP% 22.9 CDE 8.11 fluid pack lot [id number]  . CATARACT EXTRACTION W/PHACO Right 06/04/2015   Procedure: CATARACT EXTRACTION PHACO AND INTRAOCULAR LENS PLACEMENT (IOC);  Surgeon: Birder Robson, MD;  Location: [facility];  Service: Ophthalmology;  Laterality: Right;  US:00:48 AP%: 10.5 CDE:5.08 Fluid lot [id number]  . CYSTOSCOPY/URETEROSCOPY/HOLMIUM LASER/STENT PLACEMENT Bilateral 09/22/2016   Procedure: CYSTOSCOPY/URETEROSCOPY/HOLMIUM LASER/STENT PLACEMENT;  Surgeon: Hollice Espy, MD;  Location: [facility];  Service: Urology;  Laterality: Bilateral;  . EYE SURGERY  2015   tissue biopsy  . FOOT SURGERY  2015  . JOINT REPLACEMENT Left 2013   hip replacement  . LITHOTRIPSY    . PTOSIS REPAIR Bilateral 02/18/2016   Procedure: BILATERAL PTOSIS REPAIR UPPER EYELIDS;  Surgeon: Karle Starch, MD;  Location: [facility];  Service: Ophthalmology;  Laterality: Bilateral;  LEAVE PT EARLY AM  . thumb surgery Right   . TONSILLECTOMY  1973    Family History  Problem Relation Age of Onset  . Cancer Father        Abdomen with mastasis  . Cancer Mother   . Heart disease Mother   . Kidney disease Neg Hx   . Bladder Cancer Neg Hx   . Prostate cancer Neg Hx    . Kidney cancer Neg Hx     Allergies  Allergen Reactions  . Albuterol Shortness Of Breath and Other (See Comments)    Makes pt feel jittery/ tacycardic  . Crestor [Rosuvastatin] Other (See Comments)    Joint pain, muscle pain, and hair loss  . Halcion [Triazolam] Other (See Comments)    Dizziness,headaches,bladder problems  . Levaquin [Levofloxacin In D5w] Diarrhea and Itching    Shoulder pain  . Naproxen Sodium Swelling    Patient tolerates in small doses  . Tylenol [Acetaminophen] Swelling    Patient tolerates in small doses  . Cefaclor Other (See Comments)    Doesn't remember---unsure if actually allergic   . Diclofenac  Sodium Other (See Comments)    "made very sick"  . Sulfa Antibiotics Itching    Unsure of reaction possibly itching  . Tramadol Itching and Nausea And Vomiting  . Aripiprazole Other (See Comments)    Muscle tension/cramping  . Ibuprofen Swelling    Patient tolerates in small doses    Current Outpatient Medications on File Prior to Visit  Medication Sig Dispense Refill  . ASPERCREME LIDOCAINE EX Apply 1 application topically 4 (four) times daily as needed (for pain.).     Marland Kitchen aspirin 325 MG tablet Take 325 mg by mouth every 4 (four) hours as needed for headache.     . Azelastine HCl 137 MCG/SPRAY SOLN PLACE 2 SPRAYS INTO BOTH NOSTRILS 2 (TWO) TIMES DAILY 90 mL 0  . BIOTIN PO Take 200 mg by mouth daily.     . cetirizine (ZYRTEC) 10 MG tablet Take 10 mg by mouth 2 (two) times daily.    . Cholecalciferol (VITAMIN D3) 10000 units capsule Take 30,000 Units by mouth daily.     . ciclopirox (LOPROX) 0.77 % cream Apply topically 2 (two) times daily. 30 g 0  . cyanocobalamin (,VITAMIN B-12,) 1000 MCG/ML injection Inject 1 mL (1,000 mcg total) into the muscle every 3 (three) months. 1 mL 0  . desipramine (NORPRAMIN) 25 MG tablet Take one (25 mg total) by mouth in the morning. 90 tablet 0  . diclofenac sodium (VOLTAREN) 1 % GEL Apply 4 g topically 4 (four) times daily.  500 g 5  . docusate sodium (COLACE) 100 MG capsule Take 1 capsule (100 mg total) by mouth 2 (two) times daily. 60 capsule 0  . doxycycline (PERIOSTAT) 20 MG tablet Take 20 mg by mouth 2 (two) times daily.    . DULoxetine (CYMBALTA) 20 MG capsule Take 2 capsules (40 mg total) by mouth daily. 60 capsule 5  . Eszopiclone (ESZOPICLONE) 3 MG TABS Take 1 tablet (3 mg total) by mouth at bedtime as needed. Take immediately before bedtime 30 tablet 4  . HYDROcodone-acetaminophen (NORCO) 10-325 MG tablet Take 1 tablet by mouth every 8 (eight) hours as needed for moderate pain or severe pain. Supervising physician; Dr Eliezer Lofts; DEA# [id number] 90 tablet 0  . hydrocortisone 2.5 % cream Apply BID to affected area in underarms and groin prn flares    . hydroquinone 4 % cream 1 APPLICATION TOPICALLY DAILY AS NEEDED FOR BLEMISHES.  3  . hydrOXYzine (VISTARIL) 50 MG capsule TAKE 1 CAPSLE BY MOUTH AT NOON, 1 AT 6 PM, AND 1 AS NEEDED 270 capsule 2  . ketorolac (TORADOL) 10 MG tablet Take 1 tablet (10 mg total) by mouth every 8 (eight) hours as needed. 30 tablet 1  . lamoTRIgine (LAMICTAL) 150 MG tablet Take 2 tablets (300 mg total) by mouth at bedtime. 60 tablet 5  . levalbuterol (XOPENEX HFA) 45 MCG/ACT inhaler Inhale 1-2 puffs into the lungs every 6 (six) hours as needed for wheezing. 1 Inhaler 0  . levalbuterol (XOPENEX) 1.25 MG/3ML nebulizer solution USE 1 VIAL VIA NEBULIZER EVERY 4 HOURS AS NEEDED FOR WHEEZING OR SHORTNESS OF BREATH 270 mL 1  . Lysine 1000 MG TABS Take 2,000-4,000 mg by mouth at bedtime. 2000 mg scheduled at bedtime and patient will take 4000 mg if she has outbreak    . Magnesium 500 MG TABS Take 1 tablet by mouth at bedtime.    Marland Kitchen neomycin-bacitracin-polymyxin (NEOSPORIN) [phone] ointment Apply 1 application topically 4 (four) times daily as needed (for cut/scrapes.).    Marland Kitchen  pantoprazole (PROTONIX) 40 MG tablet TAKE 1 TABLET BY MOUTH EVERY DAY 90 tablet 1  . phenazopyridine (AZO-TABS) 95 MG  tablet Take 95 mg by mouth 3 (three) times daily as needed for pain.    . polyethylene glycol powder (GLYCOLAX/MIRALAX) powder MIX 17 GRAMS (1 CAPFUL) WITH 4-8 OZ OF LIQUID AND TAKE BY MOUTH TWICE DAILY AS NEEDED 527 g 0  . temazepam (RESTORIL) 30 MG capsule TAKE ONE TO TWO CAPSULES BY MOUTH NIGHTLY AT BEDTIME 60 capsule 4  . tiZANidine (ZANAFLEX) 4 MG tablet TAKE 2 TABLETS (8 MG TOTAL) BY MOUTH EVERY 6 (SIX) HOURS AS NEEDED FOR MUSCLE SPASMS. 240 tablet 0  . traZODone (DESYREL) 50 MG tablet Take 2 tablets (100 mg total) by mouth at bedtime. 60 tablet 7  . triamcinolone ointment (KENALOG) 0.5 % Apply 1 application topically 2 (two) times daily. 30 g 0  . valACYclovir (VALTREX) 1000 MG tablet Take 2 tablets by mouth twice daily for one day. 4 tablet 0  . triamcinolone lotion (KENALOG) 0.1 % Apply to AAs scalp qd/bid prn . Avoid face, groin, axilla.    . triamcinolone ointment (KENALOG) 0.1 % Apply  as directed to affected area twice a day  Avoid F/G/A     No current facility-administered medications on file prior to visit.    BP 124/84   Pulse (!) 125   Temp (!) 96.1 F (35.6 C) (Temporal)   Ht 5\' 9"  (1.753 m)   Wt 235 lb 4 oz (106.7 kg)   LMP 08/23/2014   SpO2 98%   BMI 34.74 kg/m    Objective:   Physical Exam  Constitutional: She is oriented to person, place, and time. She appears well-nourished.  HENT:  Right Ear: Tympanic membrane and ear canal normal.  Left Ear: Tympanic membrane and ear canal normal.  Mouth/Throat: Oropharynx is clear and moist.  Eyes: Pupils are equal, round, and reactive to light. EOM are normal.  Cardiovascular: Normal rate and regular rhythm.  Respiratory: Effort normal and breath sounds normal.  GI: Soft. Bowel sounds are normal. There is no abdominal tenderness.  Musculoskeletal:     Cervical back: Neck supple.     Comments: Ambulatory in clinic with cane.   Neurological: She is alert and oriented to person, place, and time. No cranial nerve deficit.   Reflex Scores:      Patellar reflexes are 2+ on the right side and 2+ on the left side. Skin: Skin is warm and dry.  Psychiatric:  Tearful, appears frustrated           Assessment & Plan:

## 2020-01-24 NOTE — Assessment & Plan Note (Signed)
Influenza and pneumonia vaccinations up-to-date. We did not get a chance to discuss the shingles vaccine.  We will bring up next visit.  Pap smear up-to-date, due in 2022. Colonoscopy overdue, referral placed to GI for colonoscopy.  Encouraged a healthy diet and some form of exercise. Exam today seems deteriorated, likely given her MS. Labs reviewed.

## 2020-01-24 NOTE — Assessment & Plan Note (Signed)
Chronic, intermittent  

## 2020-01-24 NOTE — Assessment & Plan Note (Signed)
Chronic and continued. No longer on opioids, following with pain management.  She did ask for a refill of 4 hydrocodone for which I refused.  And no longer will prescribe her narcotics.  She verbalized understanding.  She will be seeing sports medicine tomorrow.

## 2020-01-24 NOTE — Assessment & Plan Note (Signed)
Recent A1c of 6.4, discussed that she is very close to a diagnosis of diabetes and needs to change her diet and start exercising.  Continue to monitor, repeat in 3-4 months.

## 2020-01-24 NOTE — Assessment & Plan Note (Signed)
Following with pulmonology 

## 2020-01-24 NOTE — Assessment & Plan Note (Signed)
Recent vitamin D level stable, continue current regimen.

## 2020-01-24 NOTE — Assessment & Plan Note (Signed)
Continues to be uncontrolled, intolerant to statin therapy and Zetia.  She finally agrees to treatment, referral placed to lipid clinic.

## 2020-01-24 NOTE — Assessment & Plan Note (Signed)
Following with psychiatry, refuses to take [place]. Removed Lunesta from medication list per patient request.

## 2020-01-25 ENCOUNTER — Encounter: Payer: Self-pay | Admitting: Family Medicine

## 2020-01-25 ENCOUNTER — Ambulatory Visit (INDEPENDENT_AMBULATORY_CARE_PROVIDER_SITE_OTHER): Payer: PPO | Admitting: Family Medicine

## 2020-01-25 ENCOUNTER — Ambulatory Visit (INDEPENDENT_AMBULATORY_CARE_PROVIDER_SITE_OTHER)
Admission: RE | Admit: 2020-01-25 | Discharge: 2020-01-25 | Disposition: A | Discharge status: Home / Self Care | Payer: PPO | Source: Ambulatory Visit | Attending: Family Medicine | Admitting: Family Medicine

## 2020-01-25 VITALS — BP 110/76 | HR 114 | Temp 97.0°F | Ht 69.0 in | Wt 235.2 lb

## 2020-01-25 DIAGNOSIS — M25562 Pain in left knee: Secondary | ICD-10-CM | POA: Diagnosis not present

## 2020-01-25 DIAGNOSIS — Z96642 Presence of left artificial hip joint: Secondary | ICD-10-CM

## 2020-01-25 DIAGNOSIS — M25561 Pain in right knee: Secondary | ICD-10-CM | POA: Diagnosis not present

## 2020-01-25 DIAGNOSIS — M25552 Pain in left hip: Secondary | ICD-10-CM

## 2020-01-25 DIAGNOSIS — W19XXXA Unspecified fall, initial encounter: Secondary | ICD-10-CM

## 2020-01-25 DIAGNOSIS — S79912A Unspecified injury of left hip, initial encounter: Secondary | ICD-10-CM | POA: Diagnosis not present

## 2020-01-25 IMAGING — DX DG HIP (WITH OR WITHOUT PELVIS) 2-3V*L*
3 series · 3 of 3 positions shown · non-contrast
Comparison: [DATE]

CLINICAL DATA: Fall 1 week ago, hip pain

EXAM:
DG HIP (WITH OR WITHOUT PELVIS) 2-3V LEFT

[pelvis ap]
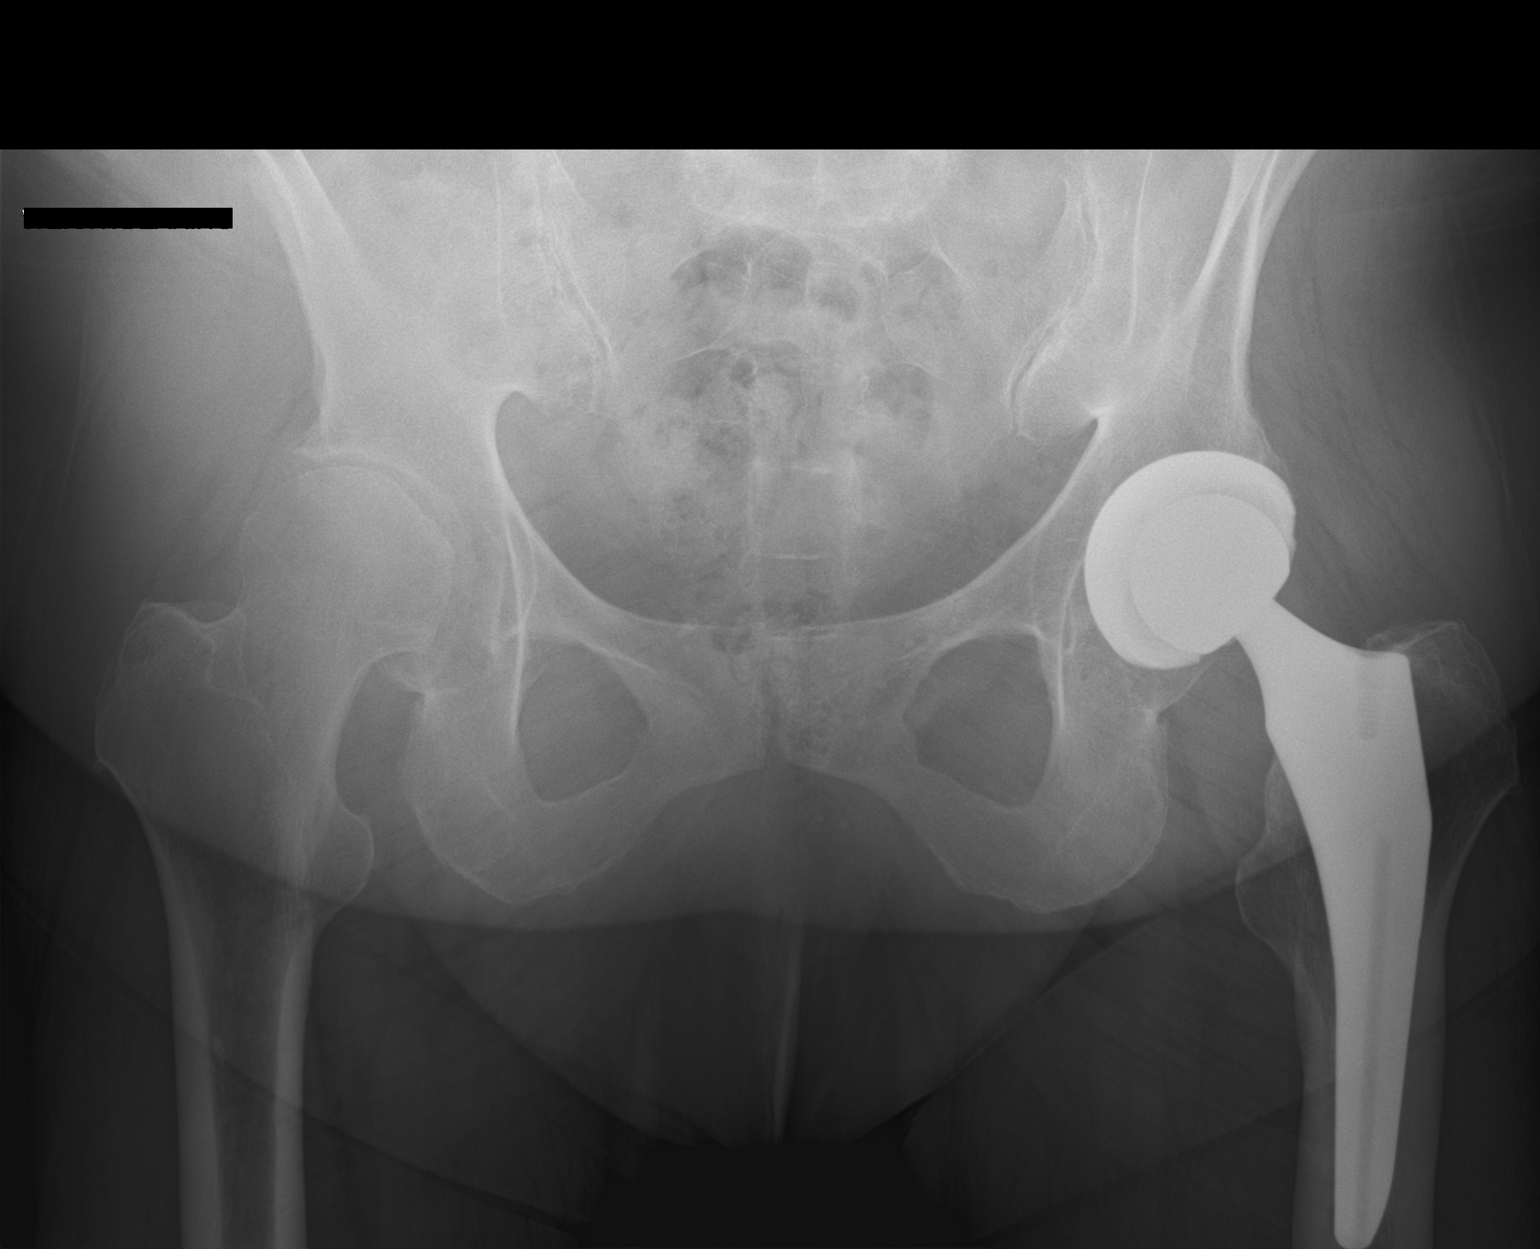

[hip ap]
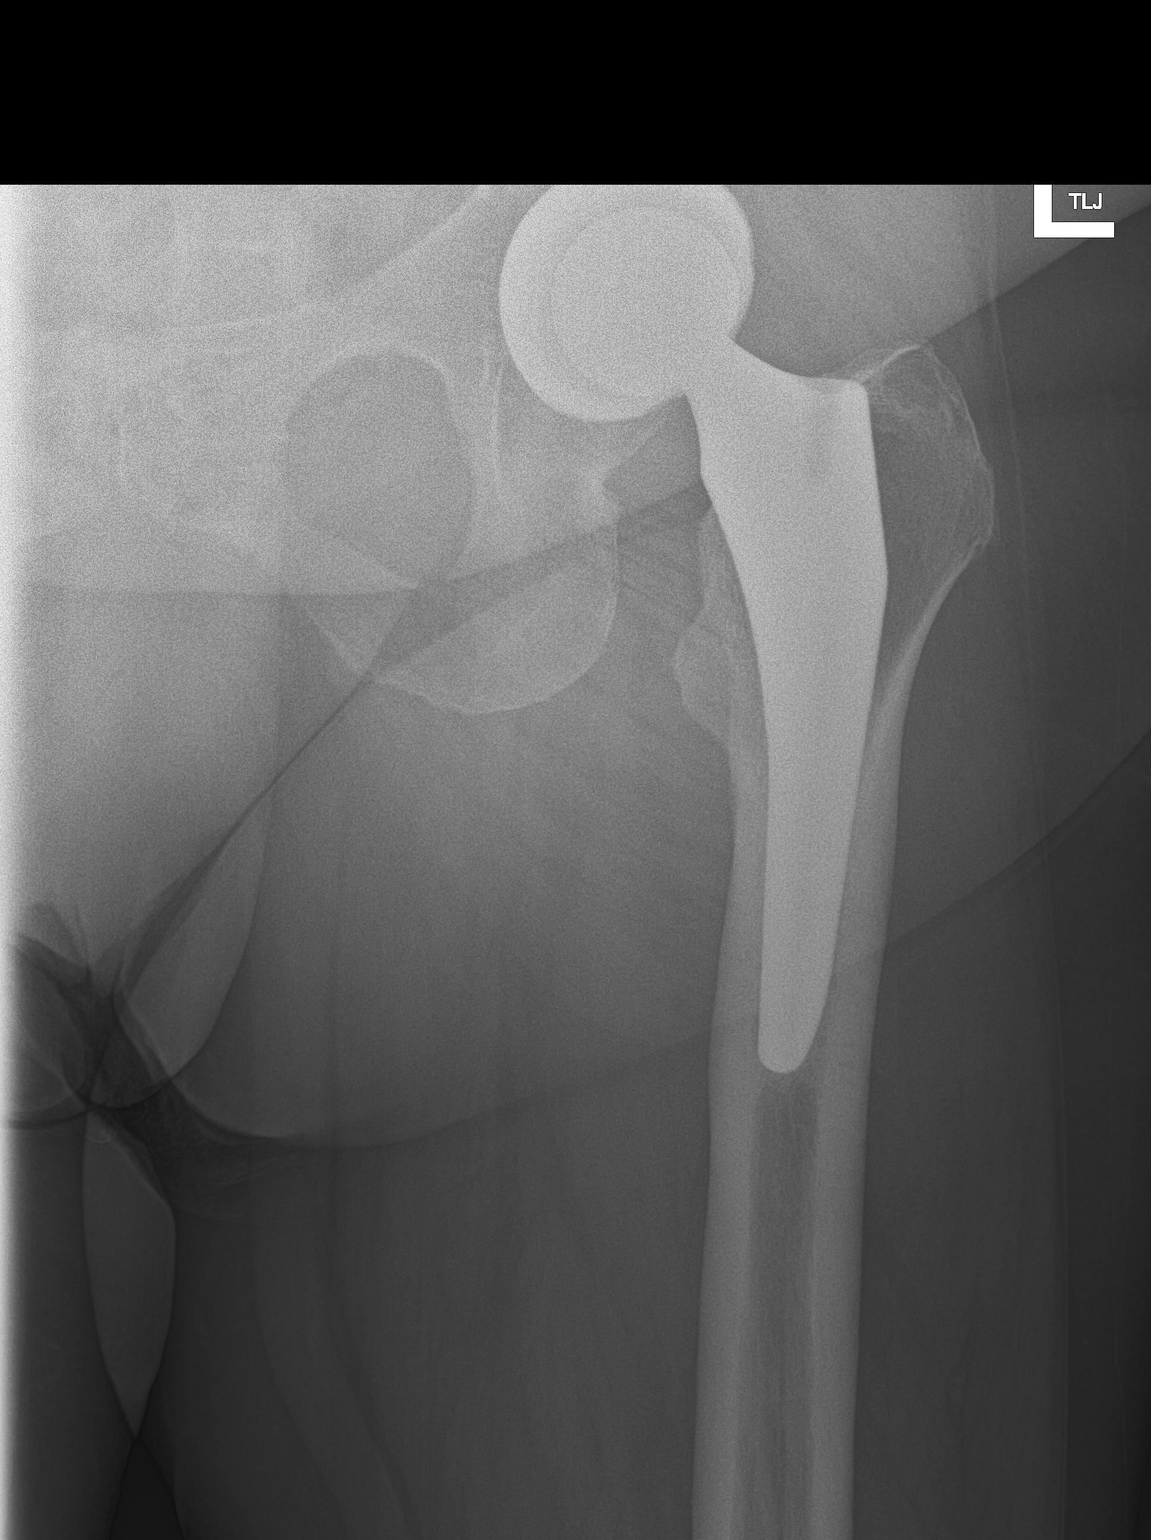

[hip lat]
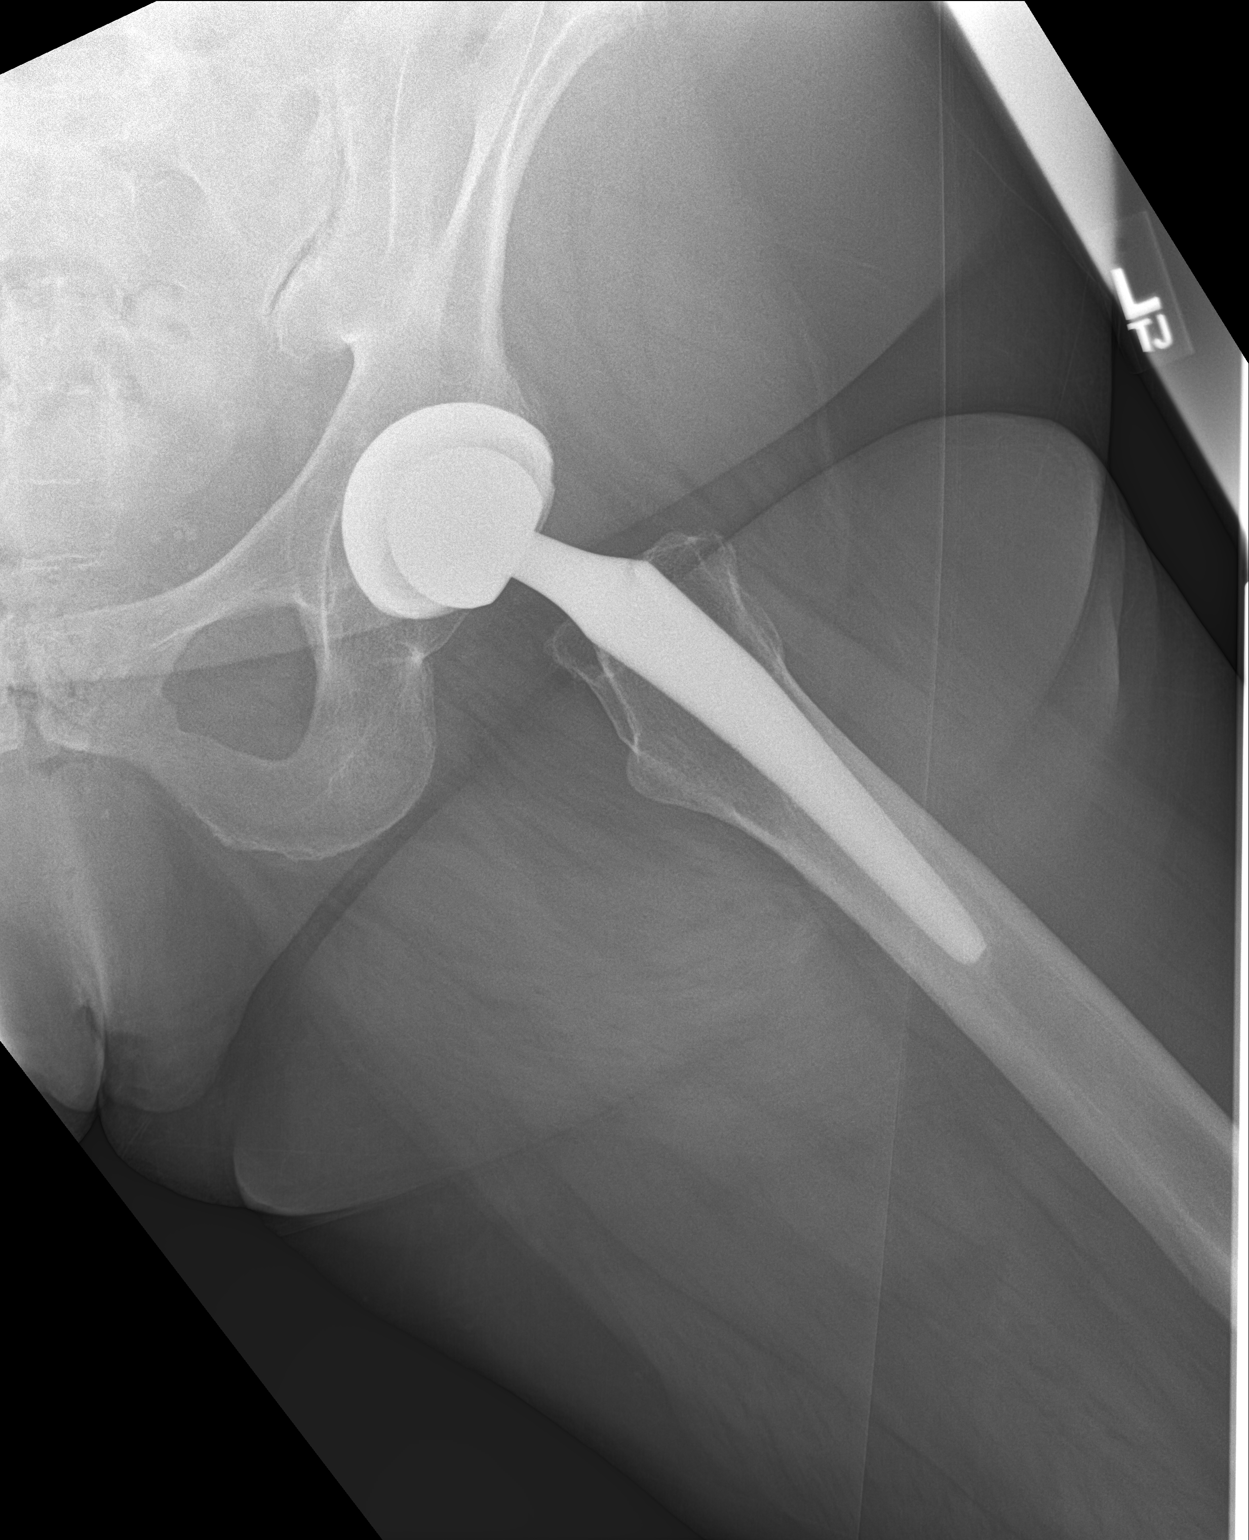

[3 of 3 positions shown; findings below may reference images not displayed]

FINDINGS: Left hip arthroplasty, without evidence of complication.

Mild degenerative changes of the right hip.

Visualized bony pelvis appears intact.
IMPRESSION: Left hip arthroplasty, without evidence of complication.

## 2020-01-25 NOTE — Progress Notes (Signed)
Emily Everitt [name], MD Primary Care and Sports Medicine [facility] at [facility] [place], [address] Phone: [phone]  FAX: [place] - 59 y.o. female  MRN [id number]  Date of Birth: 21-May-1961  Visit Date: 01/25/2020  PCP: [name], NP  Referred by: [name], NP  Chief Complaint  Patient presents with  . Leg Pain    Left  . Fall    1 week ago  . Back Pain    This visit occurred during the SARS-CoV-2 public health emergency.  Safety protocols were in place, including screening questions prior to the visit, additional usage of staff PPE, and extensive cleaning of exam room while observing appropriate contact time as indicated for disinfecting solutions.   Subjective:   Emily Moon is a 58 y.o. very pleasant female patient with Body mass index is 34.74 kg/m. who presents with the following:  [place] one week ago.  She has multiple pains today over and above her typical other pain.  Several days ago she was having even more pain in multiple joints, but today this is limited to primarily her left hip.  This is the hip that had a prior total hip arthroplasty.  She also has some pain and mild effusion in her knees, but this has improved.  Her back was initially uncomfortable after her fall, but this is improved.  She has pain more in the lateral aspect of her hip, but she does have a deep ache.  She also has some pain in the right hip to a lesser extent, not in the groin distribution, but more exteriorly and posteriorly  Review of Systems is noted in the HPI, as appropriate   Objective:   BP 110/76   Pulse (!) 114   Temp (!) 97 F (36.1 C) (Temporal)   Ht 5\' 9"  (1.753 m)   Wt 235 lb 4 oz (106.7 kg)   LMP 08/23/2014   SpO2 96%   BMI 34.74 kg/m    GEN: No acute distress; alert,appropriate. PULM: Breathing comfortably in no respiratory distress PSYCH: Normally interactive.   HIP  EXAM: SIDE: Bilateral ROM: Abduction, Flexion, Internal and External range of motion: Full Pain with terminal IROM and EROM: Minimal GTB: NT SLR: NEG Knees: She does have a bilateral mild effusion.  Stable to varus and valgus stress.  ACL and PCL are intact.  Minimal pain with flexion pinch and McMurray's, there is no mechanical symptoms on exam REVERSE FABER: NT, neg Piriformis: NT at direct palpation Str: flexion: 5/5 abduction: 5/5 adduction: 5/5 Strength testing non-tender   Radiology: DG HIP UNILAT WITH PELVIS 2-3 VIEWS LEFT  Result Date: 01/26/2020 CLINICAL DATA:  Fall 1 week ago, hip pain EXAM: DG HIP (WITH OR WITHOUT PELVIS) 2-3V LEFT COMPARISON:  11/22/2017 FINDINGS: Left hip arthroplasty, without evidence of complication. Mild degenerative changes of the right hip. Visualized bony pelvis appears intact. IMPRESSION: Left hip arthroplasty, without evidence of complication. Electronically Signed   By: Julian Hy M.D.   On: 01/26/2020 08:04    Assessment and Plan:     ICD-10-CM   1. Left hip pain  M25.552 DG HIP UNILAT WITH PELVIS 2-3 VIEWS LEFT  2. Status post hip replacement, left  Z96.642 DG HIP UNILAT WITH PELVIS 2-3 VIEWS LEFT  3. Acute pain of both knees  M25.561    M25.562   4. Accidental fall, initial encounter  W19.XXXA    Level  of Medical Decision-Making in this case is MODERATE.   Status post fall, the patient has some left hip pain laterally and posteriorly.  She also has some pain in her knees, but this is diminished over time.  All other symptoms have resolved after her fall.  There is some few bruises.  On her plain films, on my review, the patient has no evidence of loosening of her arthroplasty.  No joint erosion.  No evidence of acute fracture.  I reassured her, and continue with some over-the-counter medication for pain management.  Ice is also a reasonable thing to do for pain management.  Follow-up: No follow-ups on file.  No orders of the  defined types were placed in this encounter.  There are no discontinued medications. Orders Placed This Encounter  Procedures  . DG HIP UNILAT WITH PELVIS 2-3 VIEWS LEFT    Signed,  Caleb Decock T. Katina Remick, MD   Outpatient Encounter Medications as of 01/25/2020  Medication Sig  . ASPERCREME LIDOCAINE EX Apply 1 application topically 4 (four) times daily as needed (for pain.).   Marland Kitchen aspirin 325 MG tablet Take 325 mg by mouth every 4 (four) hours as needed for headache.   . Azelastine HCl 137 MCG/SPRAY SOLN PLACE 2 SPRAYS INTO BOTH NOSTRILS 2 (TWO) TIMES DAILY  . BIOTIN PO Take 200 mg by mouth daily.   . cetirizine (ZYRTEC) 10 MG tablet Take 10 mg by mouth 2 (two) times daily.  . Cholecalciferol (VITAMIN D3) 10000 units capsule Take 30,000 Units by mouth daily.   . ciclopirox (LOPROX) 0.77 % cream Apply topically 2 (two) times daily.  . cyanocobalamin (,VITAMIN B-12,) 1000 MCG/ML injection Inject 1 mL (1,000 mcg total) into the muscle every 3 (three) months.  . desipramine (NORPRAMIN) 25 MG tablet Take one (25 mg total) by mouth in the morning.  . diclofenac sodium (VOLTAREN) 1 % GEL Apply 4 g topically 4 (four) times daily.  Marland Kitchen docusate sodium (COLACE) 100 MG capsule Take 1 capsule (100 mg total) by mouth 2 (two) times daily.  Marland Kitchen doxycycline (PERIOSTAT) 20 MG tablet Take 20 mg by mouth 2 (two) times daily.  . DULoxetine (CYMBALTA) 20 MG capsule Take 2 capsules (40 mg total) by mouth daily.  . hydrocortisone 2.5 % cream Apply BID to affected area in underarms and groin prn flares  . hydroquinone 4 % cream 1 APPLICATION TOPICALLY DAILY AS NEEDED FOR BLEMISHES.  . hydrOXYzine (VISTARIL) 50 MG capsule TAKE 1 CAPSLE BY MOUTH AT NOON, 1 AT 6 PM, AND 1 AS NEEDED  . ketorolac (TORADOL) 10 MG tablet Take 1 tablet (10 mg total) by mouth every 8 (eight) hours as needed.  . lamoTRIgine (LAMICTAL) 150 MG tablet Take 2 tablets (300 mg total) by mouth at bedtime.  . levalbuterol (XOPENEX HFA) 45 MCG/ACT  inhaler Inhale 1-2 puffs into the lungs every 6 (six) hours as needed for wheezing.  . levalbuterol (XOPENEX) 1.25 MG/3ML nebulizer solution USE 1 VIAL VIA NEBULIZER EVERY 4 HOURS AS NEEDED FOR WHEEZING OR SHORTNESS OF BREATH  . Lysine 1000 MG TABS Take 2,000-4,000 mg by mouth at bedtime. 2000 mg scheduled at bedtime and patient will take 4000 mg if she has outbreak  . Magnesium 500 MG TABS Take 1 tablet by mouth at bedtime.  Marland Kitchen neomycin-bacitracin-polymyxin (NEOSPORIN) [phone] ointment Apply 1 application topically 4 (four) times daily as needed (for cut/scrapes.).  Marland Kitchen pantoprazole (PROTONIX) 40 MG tablet TAKE 1 TABLET BY MOUTH EVERY DAY  . phenazopyridine (AZO-TABS) 95 MG  tablet Take 95 mg by mouth 3 (three) times daily as needed for pain.  . polyethylene glycol powder (GLYCOLAX/MIRALAX) powder MIX 17 GRAMS (1 CAPFUL) WITH 4-8 OZ OF LIQUID AND TAKE BY MOUTH TWICE DAILY AS NEEDED  . temazepam (RESTORIL) 30 MG capsule TAKE ONE TO TWO CAPSULES BY MOUTH NIGHTLY AT BEDTIME  . tiZANidine (ZANAFLEX) 4 MG tablet TAKE 2 TABLETS (8 MG TOTAL) BY MOUTH EVERY 6 (SIX) HOURS AS NEEDED FOR MUSCLE SPASMS.  Marland Kitchen traZODone (DESYREL) 50 MG tablet Take 2 tablets (100 mg total) by mouth at bedtime.  . triamcinolone lotion (KENALOG) 0.1 % Apply to AAs scalp qd/bid prn . Avoid face, groin, axilla.  . triamcinolone ointment (KENALOG) 0.1 % Apply  as directed to affected area twice a day  Avoid F/G/A  . triamcinolone ointment (KENALOG) 0.5 % Apply 1 application topically 2 (two) times daily.  . valACYclovir (VALTREX) 1000 MG tablet Take 2 tablets by mouth twice daily for one day.  . [DISCONTINUED] atorvastatin (LIPITOR) 20 MG tablet TAKE 1 TABLET BY MOUTH EVERY DAY  . [DISCONTINUED] Eszopiclone (ESZOPICLONE) 3 MG TABS Take 1 tablet (3 mg total) by mouth at bedtime as needed. Take immediately before bedtime  . [DISCONTINUED] HYDROcodone-acetaminophen (NORCO) 10-325 MG tablet Take 1 tablet by mouth every 8 (eight) hours as  needed for moderate pain or severe pain. Supervising physician; Dr Eliezer Lofts; DEA# [id number]   No facility-administered encounter medications on file as of 01/25/2020.

## 2020-01-27 ENCOUNTER — Encounter: Payer: Self-pay | Admitting: Family Medicine

## 2020-02-02 ENCOUNTER — Other Ambulatory Visit: Payer: Self-pay

## 2020-02-02 ENCOUNTER — Telehealth: Payer: Self-pay

## 2020-02-02 DIAGNOSIS — Z1211 Encounter for screening for malignant neoplasm of colon: Secondary | ICD-10-CM

## 2020-02-02 NOTE — Telephone Encounter (Signed)
Gastroenterology Pre-Procedure Review  Request Date: Friday May 7th Requesting Physician: Dr. Vicente Males  PATIENT REVIEW QUESTIONS: The patient responded to the following health history questions as indicated:    1. Are you having any GI issues? no 2. Do you have a personal history of Polyps? no 3. Do you have a family history of Colon Cancer or Polyps? no 4. Diabetes Mellitus? no 5. Joint replacements in the past 12 months?no 6. Major health problems in the past 3 months?no 7. Any artificial heart valves, MVP, or defibrillator?no    MEDICATIONS & ALLERGIES:    Patient reports the following regarding taking any anticoagulation/antiplatelet therapy:   Plavix, Coumadin, Eliquis, Xarelto, Lovenox, Pradaxa, Brilinta, or Effient? no Aspirin? yes (Patient states that she is taking 81mg  not 325mg )  Patient confirms/reports the following medications:  Current Outpatient Medications  Medication Sig Dispense Refill  . ASPERCREME LIDOCAINE EX Apply 1 application topically 4 (four) times daily as needed (for pain.).     Marland Kitchen aspirin 325 MG tablet Take 325 mg by mouth every 4 (four) hours as needed for headache.     . Azelastine HCl 137 MCG/SPRAY SOLN PLACE 2 SPRAYS INTO BOTH NOSTRILS 2 (TWO) TIMES DAILY 90 mL 0  . BIOTIN PO Take 200 mg by mouth daily.     . cetirizine (ZYRTEC) 10 MG tablet Take 10 mg by mouth 2 (two) times daily.    . Cholecalciferol (VITAMIN D3) 10000 units capsule Take 30,000 Units by mouth daily.     . ciclopirox (LOPROX) 0.77 % cream Apply topically 2 (two) times daily. 30 g 0  . cyanocobalamin (,VITAMIN B-12,) 1000 MCG/ML injection Inject 1 mL (1,000 mcg total) into the muscle every 3 (three) months. 1 mL 0  . desipramine (NORPRAMIN) 25 MG tablet Take one (25 mg total) by mouth in the morning. 90 tablet 0  . diclofenac sodium (VOLTAREN) 1 % GEL Apply 4 g topically 4 (four) times daily. 500 g 5  . docusate sodium (COLACE) 100 MG capsule Take 1 capsule (100 mg total) by mouth 2 (two)  times daily. 60 capsule 0  . doxycycline (PERIOSTAT) 20 MG tablet Take 20 mg by mouth 2 (two) times daily.    . DULoxetine (CYMBALTA) 20 MG capsule Take 2 capsules (40 mg total) by mouth daily. 60 capsule 5  . hydrocortisone 2.5 % cream Apply BID to affected area in underarms and groin prn flares    . hydroquinone 4 % cream 1 APPLICATION TOPICALLY DAILY AS NEEDED FOR BLEMISHES.  3  . hydrOXYzine (VISTARIL) 50 MG capsule TAKE 1 CAPSLE BY MOUTH AT NOON, 1 AT 6 PM, AND 1 AS NEEDED 270 capsule 2  . ketorolac (TORADOL) 10 MG tablet Take 1 tablet (10 mg total) by mouth every 8 (eight) hours as needed. 30 tablet 1  . lamoTRIgine (LAMICTAL) 150 MG tablet Take 2 tablets (300 mg total) by mouth at bedtime. 60 tablet 5  . levalbuterol (XOPENEX HFA) 45 MCG/ACT inhaler Inhale 1-2 puffs into the lungs every 6 (six) hours as needed for wheezing. 1 Inhaler 0  . levalbuterol (XOPENEX) 1.25 MG/3ML nebulizer solution USE 1 VIAL VIA NEBULIZER EVERY 4 HOURS AS NEEDED FOR WHEEZING OR SHORTNESS OF BREATH 270 mL 1  . Lysine 1000 MG TABS Take 2,000-4,000 mg by mouth at bedtime. 2000 mg scheduled at bedtime and patient will take 4000 mg if she has outbreak    . Magnesium 500 MG TABS Take 1 tablet by mouth at bedtime.    Marland Kitchen  neomycin-bacitracin-polymyxin (NEOSPORIN) [phone] ointment Apply 1 application topically 4 (four) times daily as needed (for cut/scrapes.).    Marland Kitchen pantoprazole (PROTONIX) 40 MG tablet TAKE 1 TABLET BY MOUTH EVERY DAY 90 tablet 1  . phenazopyridine (AZO-TABS) 95 MG tablet Take 95 mg by mouth 3 (three) times daily as needed for pain.    . polyethylene glycol powder (GLYCOLAX/MIRALAX) powder MIX 17 GRAMS (1 CAPFUL) WITH 4-8 OZ OF LIQUID AND TAKE BY MOUTH TWICE DAILY AS NEEDED 527 g 0  . temazepam (RESTORIL) 30 MG capsule TAKE ONE TO TWO CAPSULES BY MOUTH NIGHTLY AT BEDTIME 60 capsule 4  . tiZANidine (ZANAFLEX) 4 MG tablet TAKE 2 TABLETS (8 MG TOTAL) BY MOUTH EVERY 6 (SIX) HOURS AS NEEDED FOR MUSCLE SPASMS.  240 tablet 0  . traZODone (DESYREL) 50 MG tablet Take 2 tablets (100 mg total) by mouth at bedtime. 60 tablet 7  . triamcinolone lotion (KENALOG) 0.1 % Apply to AAs scalp qd/bid prn . Avoid face, groin, axilla.    . triamcinolone ointment (KENALOG) 0.1 % Apply  as directed to affected area twice a day  Avoid F/G/A    . triamcinolone ointment (KENALOG) 0.5 % Apply 1 application topically 2 (two) times daily. 30 g 0  . valACYclovir (VALTREX) 1000 MG tablet Take 2 tablets by mouth twice daily for one day. 4 tablet 0   No current facility-administered medications for this visit.    Patient confirms/reports the following allergies:  Allergies  Allergen Reactions  . Albuterol Shortness Of Breath and Other (See Comments)    Makes pt feel jittery/ tacycardic  . Crestor [Rosuvastatin] Other (See Comments)    Joint pain, muscle pain, and hair loss  . Halcion [Triazolam] Other (See Comments)    Dizziness,headaches,bladder problems  . Levaquin [Levofloxacin In D5w] Diarrhea and Itching    Shoulder pain  . Naproxen Sodium Swelling    Patient tolerates in small doses  . Tylenol [Acetaminophen] Swelling    Patient tolerates in small doses  . Cefaclor Other (See Comments)    Doesn't remember---unsure if actually allergic   . Diclofenac Sodium Other (See Comments)    "made very sick"  . Sulfa Antibiotics Itching    Unsure of reaction possibly itching  . Tramadol Itching and Nausea And Vomiting  . Aripiprazole Other (See Comments)    Muscle tension/cramping  . Ibuprofen Swelling    Patient tolerates in small doses    No orders of the defined types were placed in this encounter.   AUTHORIZATION INFORMATION Primary Insurance: 1D#: Group #:  Secondary Insurance: 1D#: Group #:  SCHEDULE INFORMATION: Date: May 7th, 2021 Time: Location:[facility]

## 2020-02-04 ENCOUNTER — Other Ambulatory Visit: Payer: Self-pay | Admitting: Primary Care

## 2020-02-04 DIAGNOSIS — M62838 Other muscle spasm: Secondary | ICD-10-CM

## 2020-02-05 NOTE — Telephone Encounter (Signed)
KC-Plz see refill req/thx dmf 

## 2020-02-05 NOTE — Telephone Encounter (Signed)
Please call patient, how often and how much she actually taking tizanidine (muscle relaxer)? She saw pain management in January and they made note that she's actually taking at night only. Please clarify.

## 2020-02-06 NOTE — Telephone Encounter (Signed)
KC-She states that she does not need a refill right now that she has plenty/also states that she is having a bit of a flare-up and has had to take as many as 3 total daily but that is all/thx dmf

## 2020-02-07 DIAGNOSIS — R21 Rash and other nonspecific skin eruption: Secondary | ICD-10-CM

## 2020-02-07 DIAGNOSIS — J4520 Mild intermittent asthma, uncomplicated: Secondary | ICD-10-CM

## 2020-02-08 ENCOUNTER — Ambulatory Visit
Payer: PPO | Attending: Student in an Organized Health Care Education/Training Program | Admitting: Student in an Organized Health Care Education/Training Program

## 2020-02-08 ENCOUNTER — Encounter: Payer: Self-pay | Admitting: Student in an Organized Health Care Education/Training Program

## 2020-02-08 ENCOUNTER — Ambulatory Visit: Payer: PPO

## 2020-02-08 ENCOUNTER — Other Ambulatory Visit: Payer: Self-pay

## 2020-02-08 DIAGNOSIS — M1631 Unilateral osteoarthritis resulting from hip dysplasia, right hip: Secondary | ICD-10-CM | POA: Diagnosis not present

## 2020-02-08 DIAGNOSIS — E538 Deficiency of other specified B group vitamins: Secondary | ICD-10-CM | POA: Diagnosis not present

## 2020-02-08 DIAGNOSIS — Z87442 Personal history of urinary calculi: Secondary | ICD-10-CM | POA: Diagnosis not present

## 2020-02-08 DIAGNOSIS — G894 Chronic pain syndrome: Secondary | ICD-10-CM

## 2020-02-08 DIAGNOSIS — F3170 Bipolar disorder, currently in remission, most recent episode unspecified: Secondary | ICD-10-CM

## 2020-02-08 DIAGNOSIS — G35 Multiple sclerosis: Secondary | ICD-10-CM

## 2020-02-08 NOTE — Progress Notes (Signed)
Patient: Emily Moon  Service Category: E/M  Provider: Gillis Santa, MD  DOB: 08-18-1961  DOS: 02/08/2020  Location: Office  MRN: [id number]  Setting: Ambulatory outpatient  Referring Provider: [name], NP  Type: Established Patient  Specialty: Interventional Pain Management  PCP: [name], NP  Location: Home  Delivery: TeleHealth     Virtual Encounter - Pain Management PROVIDER NOTE: Information contained herein reflects review and annotations entered in association with encounter. Interpretation of such information and data should be left to medically-trained personnel. Information provided to patient can be located elsewhere in the medical record under "Patient Instructions". Document created using STT-dictation technology, any transcriptional errors that may result from process are unintentional.    Contact & Pharmacy Preferred: [phone] Home: [phone] (home) Mobile: [phone] (mobile) E-mail: [email or web address]  [facility] [place] [place] [address] [place] [address] Phone: [phone] Fax: [phone]   Pre-screening  Ms. Wirsing offered "in-person" vs "virtual" encounter. She indicated preferring virtual for this encounter.   Reason COVID-19*  Social distancing based on CDC and AMA recommendations.   I contacted [name] on 02/08/2020 via telephone.      I clearly identified myself as Gillis Santa, MD. I verified that I was speaking with the correct person using two identifiers (Name: Emily Moon, and date of birth: 1961/10/29).  This visit was completed via telephone due to the restrictions of the COVID-19 pandemic. All issues as above were discussed and addressed but no physical exam was performed. If it was felt that the patient should be evaluated in the office, they were directed there. The patient verbally consented to this visit. Patient was unable to complete an audio/visual visit  due to Technical difficulties and/or Lack of internet. Due to the catastrophic nature of the COVID-19 pandemic, this visit was done through audio contact only.  Location of the patient: home address (see Epic for details)  Location of the provider: office  Consent I sought verbal advanced consent from [name] for virtual visit interactions. I informed Ms. Mangual of possible security and privacy concerns, risks, and limitations associated with providing "not-in-person" medical evaluation and management services. I also informed Ms. Coey of the availability of "in-person" appointments. Finally, I informed her that there would be a charge for the virtual visit and that she could be  personally, fully or partially, financially responsible for it. Ms. Hataway expressed understanding and agreed to proceed.   Historic Elements   Emily Moon is a 59 y.o. year old, female patient evaluated today after her last contact with our practice on 12/19/2019. 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 She also  has a past surgical history that includes Lithotripsy; Cataract extraction w/PHACO (Left, 05/21/2015); Foot surgery (2015); Cataract extraction w/PHACO (Right, 06/04/2015); Tonsillectomy (1973); Eye surgery (2015); Joint replacement (Left, 2013); thumb surgery (Right); Ptosis repair (Bilateral, 02/18/2016); and Cystoscopy/ureteroscopy/holmium laser/stent placement (Bilateral,  09/22/2016). Emily Moon has a current medication list which includes the following prescription(s): lidocaine, aspirin, azelastine hcl, biotin, cetirizine, vitamin d3, ciclopirox, cyanocobalamin, desipramine, diclofenac sodium, docusate sodium, doxycycline, duloxetine, hydrocortisone, hydroquinone, hydroxyzine, ketorolac,  lamotrigine, levalbuterol, levalbuterol, lysine, magnesium, neomycin-bacitracin-polymyxin, pantoprazole, phenazopyridine, polyethylene glycol powder, temazepam, tizanidine, trazodone, triamcinolone lotion, triamcinolone ointment, triamcinolone ointment, and valacyclovir. She  reports that she quit smoking about 7 years ago. Her smoking use included cigarettes. She quit after 25.00 years of use. She has never used smokeless tobacco. She reports that she does not drink alcohol or use drugs. Emily Moon is allergic to albuterol; crestor [rosuvastatin]; halcion [triazolam]; levaquin [levofloxacin in d5w]; naproxen sodium; tylenol [acetaminophen]; cefaclor; diclofenac sodium; sulfa antibiotics; tramadol; aripiprazole; and ibuprofen.   HPI  Today, she is being contacted for medication management.   No benefit with Toradol. Utilizes Tizanidine at night. Endorses poor quality of life. Has gained weight- states that she is getting "neural reset therapy" Regular THC use, bipolar d/o  Laboratory Chemistry Profile   Renal Lab Results  Component Value Date   BUN 18 01/22/2020   CREATININE 0.91 01/22/2020   GFR 63.54 01/22/2020   GFRAA >60 10/13/2016   GFRNONAA >60 10/13/2016    Hepatic Lab Results  Component Value Date   AST 12 01/22/2020   ALT 16 01/22/2020   ALBUMIN 4.4 01/22/2020   ALKPHOS 73 01/22/2020   HCVAB NEGATIVE 12/17/2016    Electrolytes Lab Results  Component Value Date   NA 139 01/22/2020   K 4.2 01/22/2020   CL 99 01/22/2020   CALCIUM 9.2 01/22/2020   MG 2.2 07/27/2017    Bone Lab Results  Component Value Date   VD25OH 75.66 01/22/2020    Inflammation  (CRP: Acute Phase) (ESR: Chronic Phase) Lab Results  Component Value Date   CRP 0.2 (L) 10/24/2018   LATICACIDVEN 1.5 10/10/2016      Note: Above Lab results reviewed.  Imaging  DG HIP UNILAT WITH PELVIS 2-3 VIEWS LEFT CLINICAL DATA:  Fall 1 week ago, hip pain  EXAM: DG HIP (WITH OR WITHOUT PELVIS) 2-3V LEFT  COMPARISON:  11/22/2017  FINDINGS: Left hip arthroplasty, without evidence of complication.  Mild degenerative changes of the right hip.  Visualized bony pelvis appears intact.  IMPRESSION: Left hip arthroplasty, without evidence of complication.  Electronically Signed   By: Julian Hy M.D.   On: 01/26/2020 08:04  Assessment  The primary encounter diagnosis was MULTIPLE SCLEROSIS, PROGRESSIVE/RELAPSING. 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  Plan of Care   Ms. [name] has a current medication list which includes the following long-term medication(s): azelastine hcl, desipramine, diclofenac sodium, duloxetine, lamotrigine, levalbuterol, levalbuterol, pantoprazole, temazepam, and trazodone.  Patient instructed to follow up after she has completed her spine MRI.  Follow-up plan:   Return for After Imaging.    Recent Visits Date Type Provider Dept  11/28/19 Office Visit Gillis Santa, MD [facility]  Showing recent visits within past 90 days and meeting all other requirements   Future Appointments No visits were found meeting these conditions.  Showing future appointments within next 90 days and meeting all other requirements   I discussed the assessment and treatment plan with the patient. The patient was provided an opportunity to ask questions and all were answered. The patient agreed with the plan and demonstrated an understanding of the instructions.  Patient  advised to call back or seek an in-person evaluation if the symptoms or condition worsens.  Duration of encounter: 15 minutes.  Note by: Gillis Santa, MD Date: 02/08/2020; Time: 2:29 PM

## 2020-02-09 ENCOUNTER — Other Ambulatory Visit: Payer: Self-pay | Admitting: Psychiatry

## 2020-02-09 DIAGNOSIS — G35 Multiple sclerosis: Secondary | ICD-10-CM

## 2020-02-13 ENCOUNTER — Other Ambulatory Visit: Payer: Self-pay | Admitting: Psychiatry

## 2020-02-13 ENCOUNTER — Ambulatory Visit (INDEPENDENT_AMBULATORY_CARE_PROVIDER_SITE_OTHER): Payer: PPO

## 2020-02-13 ENCOUNTER — Other Ambulatory Visit: Payer: Self-pay

## 2020-02-13 DIAGNOSIS — E538 Deficiency of other specified B group vitamins: Secondary | ICD-10-CM | POA: Diagnosis not present

## 2020-02-13 DIAGNOSIS — F310 Bipolar disorder, current episode hypomanic: Secondary | ICD-10-CM

## 2020-02-13 MED ORDER — CYANOCOBALAMIN 1000 MCG/ML IJ SOLN
1000.0000 ug | Freq: Once | INTRAMUSCULAR | Status: AC
  Administered 2020-02-13: 1000 ug via INTRAMUSCULAR

## 2020-02-13 NOTE — Progress Notes (Signed)
Per orders of Allie Bossier, NP, injection of B12 1052mcg/ml given by Pilar Grammes, CMA in Left Deltoid. Patient tolerated injection well.  Advised her to call and schedule monthly B12s for the next 5 months per Anda Kraft.

## 2020-02-19 ENCOUNTER — Encounter: Payer: Self-pay | Admitting: Emergency Medicine

## 2020-02-19 ENCOUNTER — Other Ambulatory Visit: Payer: Self-pay

## 2020-02-19 ENCOUNTER — Emergency Department
Admission: EM | Admit: 2020-02-19 | Discharge: 2020-02-19 | Disposition: A | Discharge status: Home / Self Care | Payer: PPO | Attending: Emergency Medicine | Admitting: Emergency Medicine

## 2020-02-19 ENCOUNTER — Emergency Department: Payer: PPO

## 2020-02-19 DIAGNOSIS — Z79899 Other long term (current) drug therapy: Secondary | ICD-10-CM | POA: Diagnosis not present

## 2020-02-19 DIAGNOSIS — M79604 Pain in right leg: Secondary | ICD-10-CM | POA: Insufficient documentation

## 2020-02-19 DIAGNOSIS — M797 Fibromyalgia: Secondary | ICD-10-CM | POA: Insufficient documentation

## 2020-02-19 DIAGNOSIS — R000 Tachycardia, unspecified: Secondary | ICD-10-CM | POA: Insufficient documentation

## 2020-02-19 DIAGNOSIS — M79605 Pain in left leg: Secondary | ICD-10-CM | POA: Insufficient documentation

## 2020-02-19 DIAGNOSIS — G35 Multiple sclerosis: Secondary | ICD-10-CM | POA: Insufficient documentation

## 2020-02-19 DIAGNOSIS — R0602 Shortness of breath: Secondary | ICD-10-CM | POA: Diagnosis not present

## 2020-02-19 DIAGNOSIS — R21 Rash and other nonspecific skin eruption: Secondary | ICD-10-CM | POA: Diagnosis not present

## 2020-02-19 LAB — COMPREHENSIVE METABOLIC PANEL
ALT: 19 U/L (ref 0–44)
AST: 20 U/L (ref 15–41)
Albumin: 4.5 g/dL (ref 3.5–5.0)
Alkaline Phosphatase: 75 U/L (ref 38–126)
Anion gap: 11 (ref 5–15)
BUN: 20 mg/dL (ref 6–20)
CO2: 25 mmol/L (ref 22–32)
Calcium: 9.2 mg/dL (ref 8.9–10.3)
Chloride: 103 mmol/L (ref 98–111)
Creatinine, Ser: 0.91 mg/dL (ref 0.44–1.00)
GFR calc Af Amer: >60 mL/min (ref >60)
GFR calc non Af Amer: >60 mL/min (ref >60)
Glucose, Bld: 128 mg/dL — ABNORMAL HIGH (ref 70–99)
Potassium: 3.8 mmol/L (ref 3.5–5.1)
Sodium: 139 mmol/L (ref 135–145)
Total Bilirubin: 0.8 mg/dL (ref 0.3–1.2)
Total Protein: 7.6 g/dL (ref 6.5–8.1)

## 2020-02-19 LAB — BRAIN NATRIURETIC PEPTIDE: B Natriuretic Peptide: 22 pg/mL (ref 0.0–100.0)

## 2020-02-19 LAB — CBC WITH DIFFERENTIAL/PLATELET
Abs Immature Granulocytes: 0.13 10*3/uL — ABNORMAL HIGH (ref 0.00–0.07)
Basophils Absolute: 0.1 10*3/uL (ref 0.0–0.1)
Basophils Relative: 1 %
Eosinophils Absolute: 0.4 10*3/uL (ref 0.0–0.5)
Eosinophils Relative: 4 %
HCT: 42.2 % (ref 36.0–46.0)
Hemoglobin: 14.2 g/dL (ref 12.0–15.0)
Immature Granulocytes: 2 %
Lymphocytes Relative: 21 %
Lymphs Abs: 1.8 10*3/uL (ref 0.7–4.0)
MCH: 30.7 pg (ref 26.0–34.0)
MCHC: 33.6 g/dL (ref 30.0–36.0)
MCV: 91.3 fL (ref 80.0–100.0)
Monocytes Absolute: 0.4 10*3/uL (ref 0.1–1.0)
Monocytes Relative: 5 %
Neutro Abs: 5.6 10*3/uL (ref 1.7–7.7)
Neutrophils Relative %: 67 %
Platelets: 352 10*3/uL (ref 150–400)
RBC: 4.62 MIL/uL (ref 3.87–5.11)
RDW: 12.7 % (ref 11.5–15.5)
WBC: 8.3 10*3/uL (ref 4.0–10.5)
nRBC: 0 % (ref 0.0–0.2)

## 2020-02-19 LAB — TSH: TSH: 0.902 u[IU]/mL (ref 0.350–4.500)

## 2020-02-19 LAB — T4, FREE: Free T4: 1.08 ng/dL (ref 0.61–1.12)

## 2020-02-19 LAB — TROPONIN I (HIGH SENSITIVITY): Troponin I (High Sensitivity): 4 ng/L (ref <18)

## 2020-02-19 LAB — FIBRIN DERIVATIVES D-DIMER (ARMC ONLY): Fibrin derivatives D-dimer (ARMC): 449.53 ng/mL (FEU) (ref 0.00–499.00)

## 2020-02-19 IMAGING — CR DG CHEST 2V
1 series · 2 of 2 positions shown · non-contrast
Comparison: [DATE]

CLINICAL DATA: Short of breath tachycardia

EXAM:
CHEST - 2 VIEW

[Series 1: dg chest 2 view · 0.14mm/px · 2 of 2 slices shown]
[im 1/2]
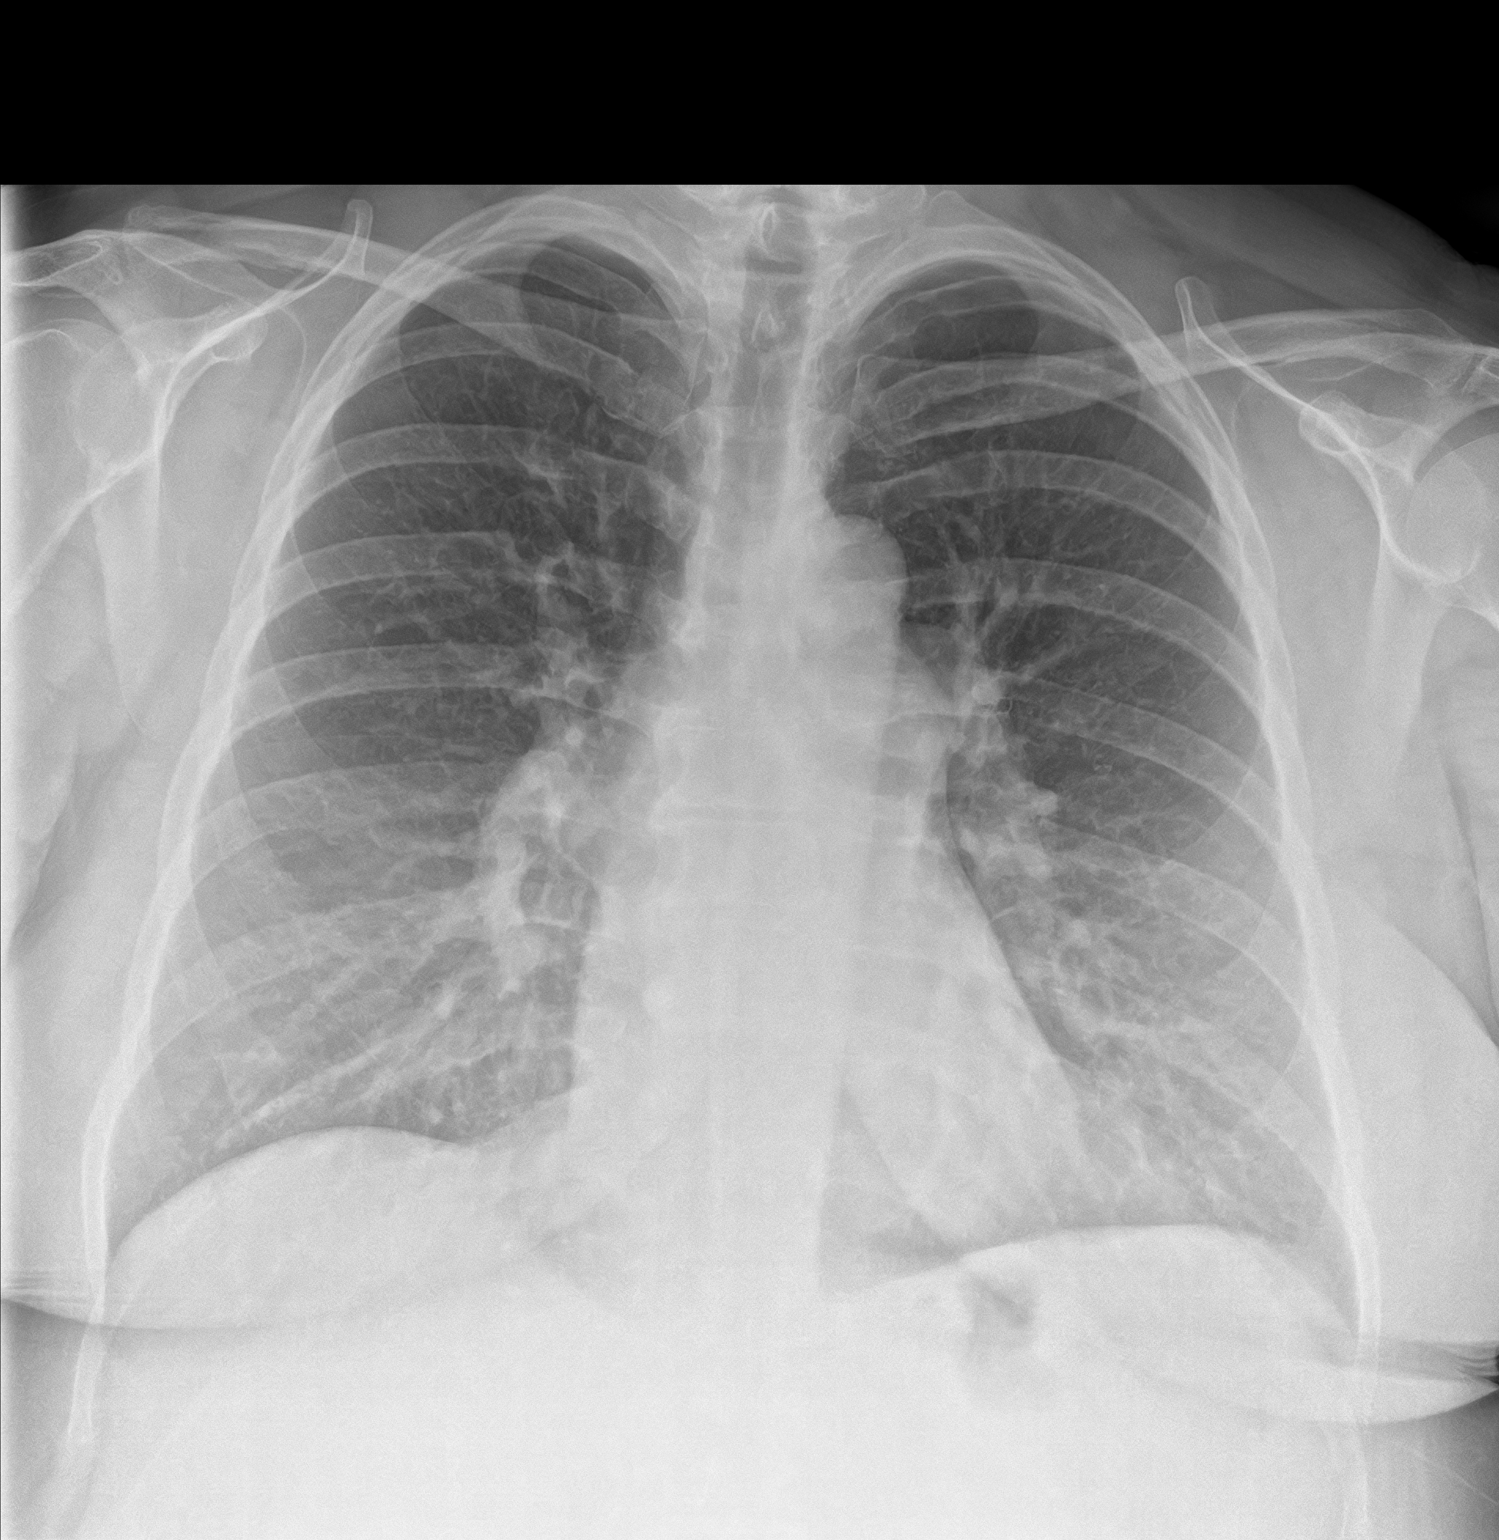
[im 2/2]
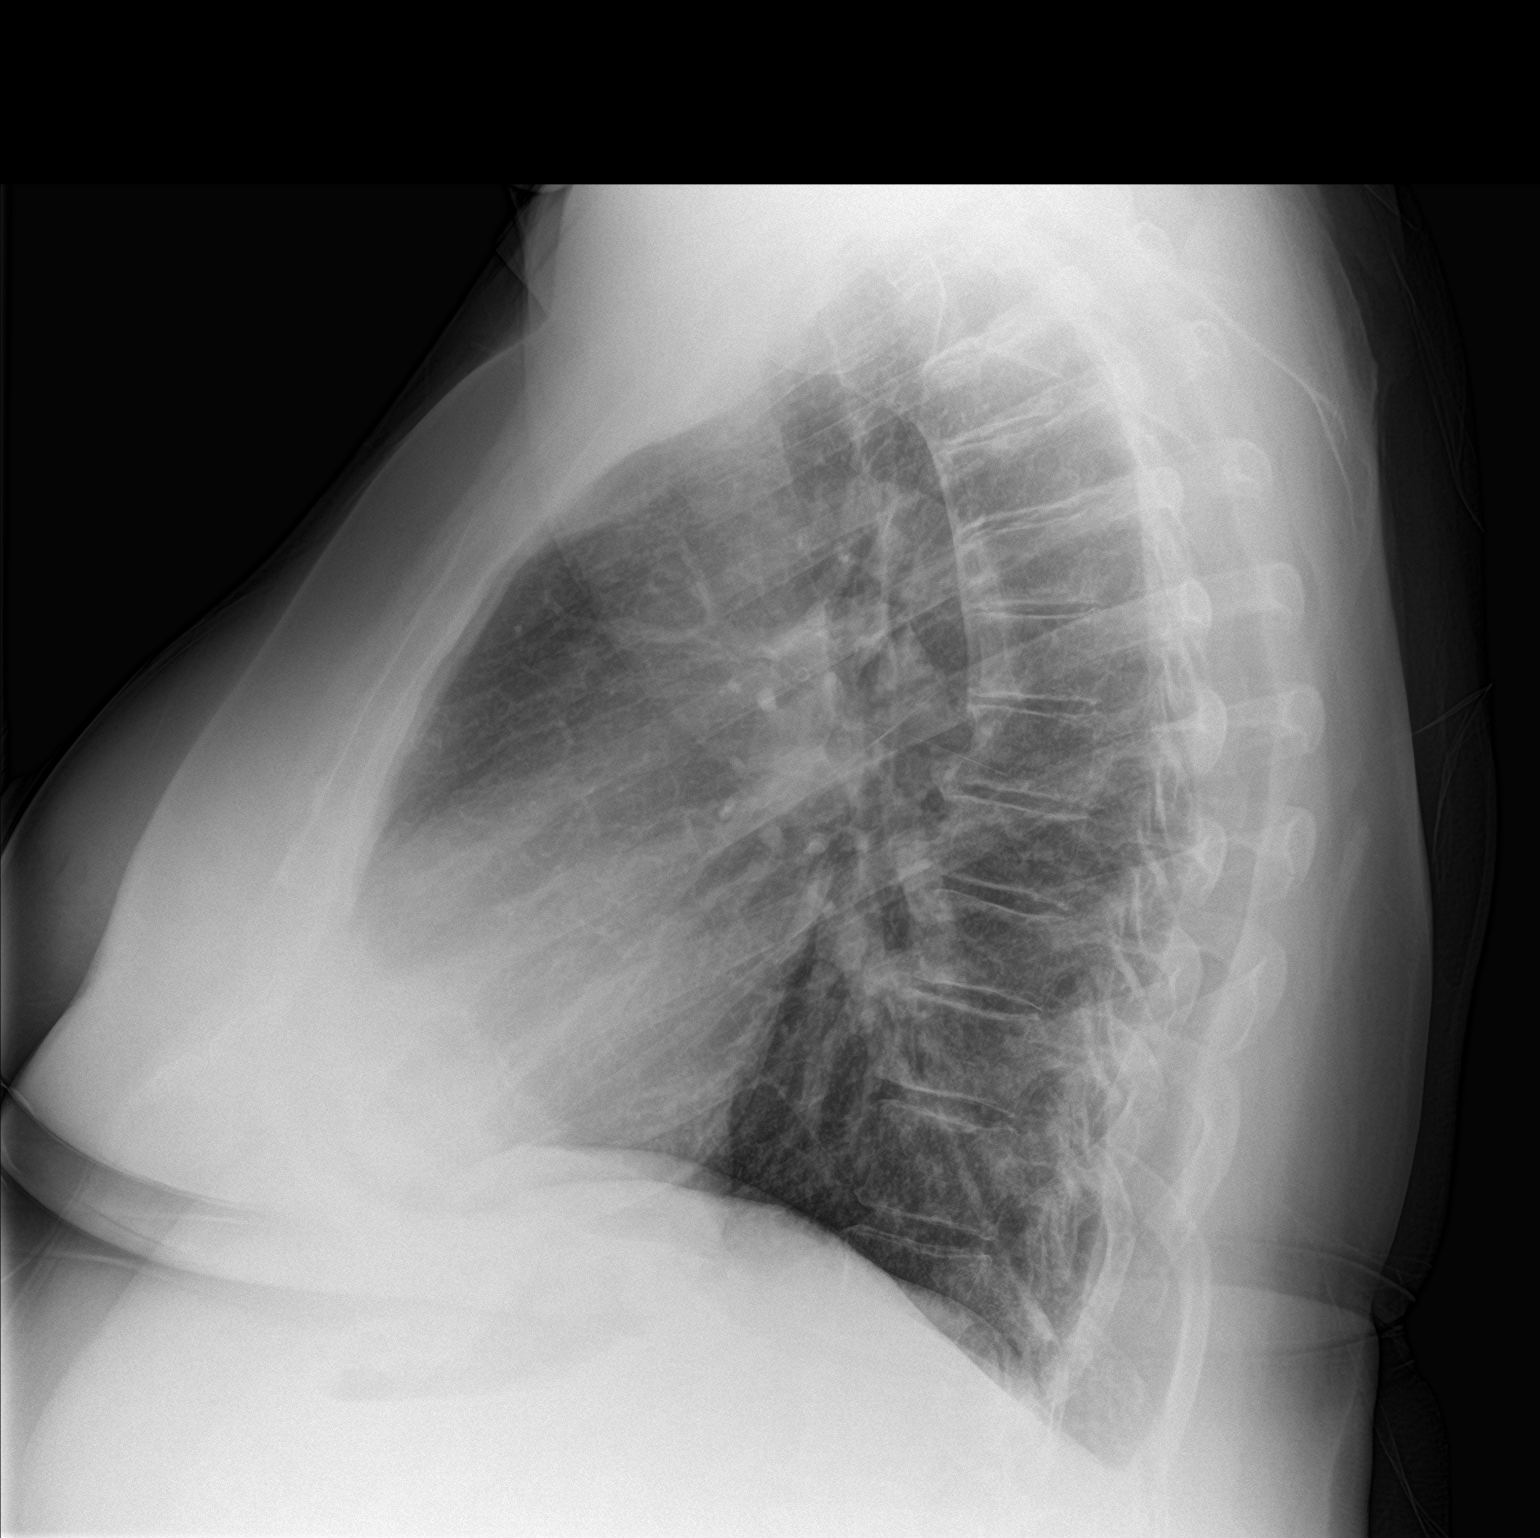

[2 of 2 positions shown; findings below may reference images not displayed]

FINDINGS: The heart size and mediastinal contours are within normal limits.
Both lungs are clear. The visualized skeletal structures are
unremarkable.
IMPRESSION: No active cardiopulmonary disease.

## 2020-02-19 NOTE — ED Notes (Signed)
Pt states having increased right leg swelling in addition to having a slight red line on her calf. Pt states having pain in her thigh and in her knee.

## 2020-02-19 NOTE — ED Triage Notes (Signed)
Pt presents to ED via POV with c/o bilateral lower leg pain, pt states she has been tachycardic x 3 weeks. Pt states she has been following her HR with ECG on her watch. Pt states resting HR x 3 weeks has been 108, lowest HR she has is 80 while she has been sleeping. Pt states "line of demarcation to posterior R calf". Pt states swelling has increased as well. Pt also c/o shortness of breath. Able to speak in full and complete sentences without difficulty.

## 2020-02-19 NOTE — Telephone Encounter (Signed)
Noted. Per EMR, patient presented to ER for evaluation.

## 2020-02-19 NOTE — Telephone Encounter (Signed)
I spoke with pt; pt said starting 02/16/20  Pt had swelling and pain in lower part of rt calf; now has redness above rt knee and both legs feel warm to touch on the back sides of her legs. Pt has fluttering in chest/ pt said not really CP but has more SOB than usual but pt thought might be because has gained wt. Advised symptoms could be lots of different things but concerned about DVT or PE. Pt voiced understanding and will go to [facility] ED now if I will call ahead to let them know she is coming by car in about 1 hr; I advised pt I will call [facility] ED and let them know she is coming and symptoms but also advised pt would still be triaged by ED nurses;I spoke with Meisha at [facility] ED and she voiced understanding of above info. FYI to Gentry Fitz NP who is out of office today and Glenda Chroman FNP who is in office.

## 2020-02-19 NOTE — ED Provider Notes (Signed)
[facility] Emergency Department Provider Note   ____________________________________________   I have reviewed the triage vital signs and the nursing notes.   HISTORY  Chief Complaint Leg rash  History limited by: Not Limited   HPI Emily Moon is a 59 y.o. female who presents to the emergency department today because of concern for rash on legs.  The patient states that she first noticed a rash on her right lower calf.  She she did see that there was a linear line at the top of the rash.  She then started developing redness and discomfort to her thighs and the back of her knees.  Additionally the patient has had issues with fast heart rate.  She states that her heart rate will go up to 140.  This however has been going on for 8 months.  She states she has not yet seen a cardiologist about that.  When she talked to her primary care doctor they were concerned for possible blood clots.  Records reviewed. Per medical record review patient has a history of MS.   Past Medical History:  Diagnosis Date  . Arthritis    osteo  . Asthma   . Bipolar disorder ([place]) 05/21/14  . Cataracts, bilateral   . DDD (degenerative disc disease), cervical    also back  . Depression   . Dizziness    Positional  . Edema    feet/legs  . Fibromyalgia syndrome   . Fungal infection    Finger nails  . GERD (gastroesophageal reflux disease)   . Gout   . Headache    seasonal allergies  . Heart palpitations   . 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 Hypercholesterolemia   . Multiple sclerosis (HCC)    weakness  . Nephrolithiasis    kidney stones  . Osteoporosis    osteoarthritis  . Pneumonia   . PONV (postoperative nausea and vomiting)    no problem after cataract surgery  . Psoriasis   . Renal stone   . Shortness of breath dyspnea    wheezing  . Sleep apnea 2012   sleep study / slight, no interventions  . Urinary frequency     Patient Active Problem List    Diagnosis Date Noted  . Marijuana use 11/28/2019  . Psoriasis 11/02/2019  . Rash and nonspecific skin eruption 02/20/2019  . Asthma 02/10/2019  . Epigastric pain 12/21/2018  . OSA (obstructive sleep apnea) 12/02/2018  . Joint swelling 10/24/2018  . Environmental and seasonal allergies 10/24/2018  . Greater trochanteric bursitis of right hip 09/28/2018  . Acute pain of right shoulder 09/28/2018  . Prediabetes 07/22/2018  . Bipolar I disorder, most recent episode (or current) manic ([place]) 05/20/2018  . Vitamin D deficiency 02/15/2018  . Preventative health care 11/24/2017  . Urinary frequency 03/30/2017  . B12 deficiency 05/22/2016  . Dizziness 03/16/2016  . Medicare annual wellness visit, subsequent 10/25/2015  . Back pain 10/31/2014  . Weight gain 06/21/2014  . Bipolar disorder ([place]) 05/21/2014  . 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 Insomnia 09/01/2011  . Obesity 05/04/2011  . HLD (hyperlipidemia) 01/20/2011  . Tobacco abuse 01/20/2011  . Chest pain 01/20/2011  . Chronic pain syndrome 01/07/2011  . RENAL CALCULUS, RECURRENT 11/13/2010  . MULTIPLE SCLEROSIS, PROGRESSIVE/RELAPSING 08/28/2010  . GERD 08/28/2010  . Osteoarthritis 08/28/2010  . NEPHROLITHIASIS, HX OF 08/28/2010    Past Surgical History:  Procedure Laterality Date  . CATARACT EXTRACTION W/PHACO Left 05/21/2015   Procedure: CATARACT EXTRACTION PHACO AND INTRAOCULAR LENS PLACEMENT (IOC);  Surgeon: Birder Robson, MD;  Location: [facility] ORS;  Service: Ophthalmology;  Laterality: Left;  [place] 00:35 AP% 22.9 CDE 8.11 fluid pack lot [id number]  . CATARACT EXTRACTION W/PHACO Right 06/04/2015   Procedure: CATARACT EXTRACTION PHACO AND INTRAOCULAR LENS PLACEMENT (IOC);  Surgeon: Birder Robson, MD;  Location: [facility] ORS;  Service: Ophthalmology;  Laterality: Right;  US:00:48 AP%: 10.5 CDE:5.08 Fluid lot [id number]  .  CYSTOSCOPY/URETEROSCOPY/HOLMIUM LASER/STENT PLACEMENT Bilateral 09/22/2016   Procedure: CYSTOSCOPY/URETEROSCOPY/HOLMIUM LASER/STENT PLACEMENT;  Surgeon: Hollice Espy, MD;  Location: [facility] ORS;  Service: Urology;  Laterality: Bilateral;  . EYE SURGERY  2015   tissue biopsy  . FOOT SURGERY  2015  . JOINT REPLACEMENT Left 2013   hip replacement  . LITHOTRIPSY    . PTOSIS REPAIR Bilateral 02/18/2016   Procedure: BILATERAL PTOSIS REPAIR UPPER EYELIDS;  Surgeon: Karle Starch, MD;  Location: [facility];  Service: Ophthalmology;  Laterality: Bilateral;  LEAVE PT EARLY AM  . thumb surgery Right   . TONSILLECTOMY  1973    Prior to Admission medications   Medication Sig Start Date End Date Taking? Authorizing Provider  ASPERCREME LIDOCAINE EX Apply 1 application topically 4 (four) times daily as needed (for pain.).     [provider]  aspirin 325 MG tablet Take 325 mg by mouth every 4 (four) hours as needed for headache.     [provider]  Azelastine HCl 137 MCG/SPRAY SOLN PLACE 2 SPRAYS INTO BOTH NOSTRILS 2 (TWO) TIMES DAILY 11/20/19   [name], NP  BIOTIN PO Take 200 mg by mouth daily.     [provider]  cetirizine (ZYRTEC) 10 MG tablet Take 10 mg by mouth 2 (two) times daily.    [provider]  Cholecalciferol (VITAMIN D3) 10000 units capsule Take 30,000 Units by mouth daily.     [provider]  ciclopirox (LOPROX) 0.77 % cream Apply topically 2 (two) times daily. 10/27/19   [name], NP  cyanocobalamin (,VITAMIN B-12,) 1000 MCG/ML injection Inject 1 mL (1,000 mcg total) into the muscle every 3 (three) months. 08/08/18   [name], NP  desipramine (NORPRAMIN) 25 MG tablet TAKE 1 TABLET BY MOUTH IN THE MORNING 02/16/20   Plovsky, Berneta Sages, MD  diclofenac sodium (VOLTAREN) 1 % GEL Apply 4 g topically 4 (four) times daily. 11/15/14   Jearld Fenton, NP  docusate sodium (COLACE) 100 MG capsule Take 1 capsule (100 mg  total) by mouth 2 (two) times daily. 09/22/16   Hollice Espy, MD  doxycycline (PERIOSTAT) 20 MG tablet Take 20 mg by mouth 2 (two) times daily. 01/11/20   [provider]  DULoxetine (CYMBALTA) 20 MG capsule Take 2 capsules (40 mg total) by mouth daily. 01/03/20   Plovsky, Berneta Sages, MD  hydrocortisone 2.5 % cream Apply BID to affected area in underarms and groin prn flares 01/11/20   [provider]  hydroquinone 4 % cream 1 APPLICATION TOPICALLY DAILY AS NEEDED FOR BLEMISHES. 05/16/15   [provider]  hydrOXYzine (VISTARIL) 50 MG capsule TAKE 1 CAPSLE BY MOUTH AT NOON, 1 AT 6 PM, AND 1 AS NEEDED 01/03/20   Plovsky, Berneta Sages, MD  ketorolac (TORADOL) 10 MG tablet Take 1 tablet (10 mg total) by mouth every 8 (eight) hours as needed. 11/28/19   Gillis Santa, MD  lamoTRIgine (LAMICTAL) 150 MG tablet Take 2 tablets (  300 mg total) by mouth at bedtime. 01/03/20   Plovsky, Berneta Sages, MD  levalbuterol [facility] HFA) 45 MCG/ACT inhaler Inhale 1-2 puffs into the lungs every 6 (six) hours as needed for wheezing. 08/26/18   [name], NP  levalbuterol (XOPENEX) 1.25 MG/3ML nebulizer solution USE 1 VIAL VIA NEBULIZER EVERY 4 HOURS AS NEEDED FOR WHEEZING OR SHORTNESS OF BREATH 02/21/19   [name], NP  Lysine 1000 MG TABS Take 2,000-4,000 mg by mouth at bedtime. 2000 mg scheduled at bedtime and patient will take 4000 mg if she has Dentist, Historical, MD  Magnesium 500 MG TABS Take 1 tablet by mouth at bedtime.    [provider]  neomycin-bacitracin-polymyxin (NEOSPORIN) [phone] ointment Apply 1 application topically 4 (four) times daily as needed (for cut/scrapes.).    [provider]  pantoprazole (PROTONIX) 40 MG tablet TAKE 1 TABLET BY MOUTH EVERY DAY 12/15/19   [name], NP  phenazopyridine (AZO-TABS) 95 MG tablet Take 95 mg by mouth 3 (three) times daily as needed for pain.    [provider]  polyethylene glycol powder  (GLYCOLAX/MIRALAX) powder MIX 17 GRAMS (1 CAPFUL) WITH 4-8 OZ OF LIQUID AND TAKE BY MOUTH TWICE DAILY AS NEEDED 11/24/17   [name], NP  temazepam (RESTORIL) 30 MG capsule TAKE ONE TO TWO CAPSULES BY MOUTH NIGHTLY AT BEDTIME 08/30/19   Plovsky, Berneta Sages, MD  tiZANidine (ZANAFLEX) 4 MG tablet TAKE 2 TABLETS (8 MG TOTAL) BY MOUTH EVERY 6 (SIX) HOURS AS NEEDED FOR MUSCLE SPASMS. 11/20/19   [name], NP  traZODone (DESYREL) 50 MG tablet Take 2 tablets (100 mg total) by mouth at bedtime. 08/30/19   Plovsky, Berneta Sages, MD  triamcinolone lotion (KENALOG) 0.1 % Apply to AAs scalp qd/bid prn . Avoid face, groin, axilla. 01/11/20   [provider]  triamcinolone ointment (KENALOG) 0.1 % Apply  as directed to affected area twice a day  Avoid F/G/A 01/11/20   [provider]  triamcinolone ointment (KENALOG) 0.5 % Apply 1 application topically 2 (two) times daily. 11/02/19   [name], NP  valACYclovir (VALTREX) 1000 MG tablet Take 2 tablets by mouth twice daily for one day. 10/06/19   [name], NP    Allergies Albuterol, Crestor [rosuvastatin], Halcion [triazolam], Levaquin [levofloxacin in d5w], Naproxen sodium, Tylenol [acetaminophen], Cefaclor, Diclofenac sodium, Sulfa antibiotics, Tramadol, Aripiprazole, and Ibuprofen  Family History  Problem Relation Age of Onset  . Cancer Father        Abdomen with mastasis  . Cancer Mother   . Heart disease Mother   . Kidney disease Neg Hx   . Bladder Cancer Neg Hx   . Prostate cancer Neg Hx   . Kidney cancer Neg Hx     Social History Social History   Tobacco Use  . Smoking status: Former Smoker    Years: 25.00    Types: Cigarettes    Quit date: 11/16/2012    Years since quitting: 7.2  . Smokeless tobacco: Never Used  . Tobacco comment: occasional use  Substance Use Topics  . Alcohol use: No    Alcohol/week: 0.0 standard drinks  . Drug use: No    Types: Methylphenidate    Review of  Systems Constitutional: No fever/chills Eyes: No visual changes. ENT: No sore throat. Cardiovascular: Positive for tachycardia. Respiratory: Denies shortness of breath. Gastrointestinal: No abdominal pain.  No nausea, no vomiting.  No diarrhea.   Genitourinary: Negative for dysuria. Musculoskeletal:  Positive for leg pain. Skin: Positive for rash on lower legs. Neurological: Negative for headaches, focal weakness or numbness.  ____________________________________________   PHYSICAL EXAM:  VITAL SIGNS: ED Triage Vitals  Enc Vitals Group     BP 02/19/20 1544 137/64     Pulse Rate 02/19/20 1544 (!) 129     Resp 02/19/20 1544 20     Temp 02/19/20 1544 99.1 F (37.3 C)     Temp Source 02/19/20 1544 Oral     SpO2 02/19/20 1544 98 %     Weight 02/19/20 1551 230 lb (104.3 kg)     Height 02/19/20 1551 5\' 9"  (1.753 m)     Head Circumference --      Peak Flow --      Pain Score 02/19/20 1550 8   Constitutional: Alert and oriented.  Eyes: Conjunctivae are normal.  ENT      Head: Normocephalic and atraumatic.      Nose: No congestion/rhinnorhea.      Mouth/Throat: Mucous membranes are moist.      Neck: No stridor. Hematological/Lymphatic/Immunilogical: No cervical lymphadenopathy. Cardiovascular: Tachycardia, regular rhythm.  No murmurs, rubs, or gallops.  Respiratory: Normal respiratory effort without tachypnea nor retractions. Breath sounds are clear and equal bilaterally. No wheezes/rales/rhonchi. Gastrointestinal: Soft and non tender. No rebound. No guarding.  Genitourinary: Deferred Musculoskeletal: Normal range of motion in all extremities. No lower extremity edema. Neurologic:  Normal speech and language. No gross focal neurologic deficits are appreciated.  Skin:  Skin is warm, dry and intact. Rash noted to right lower half of calf. Circumferential linear demarcation between rash and normal skin.  Psychiatric: Mood and affect are normal. Speech and behavior are normal.  Patient exhibits appropriate insight and judgment.  ____________________________________________    LABS (pertinent positives/negatives)  Trop hs 4 BNP 22.0 CBC wbc 8.3, hgb 14.2, plt 352 CMP wnl except glu 128 TSH 0.902 D-dimer  ____________________________________________   EKG  I, Nance Pear, attending physician, personally viewed and interpreted this EKG  EKG Time: 1550 Rate: 121 Rhythm: sinus tachycardia Axis: right axis deviation Intervals: qtc 454 QRS: narrow ST changes: no st elevation Impression: abnormal ekg   ____________________________________________    RADIOLOGY  CXR No active cardiopulmonary disease  ____________________________________________   PROCEDURES  Procedures  ____________________________________________   INITIAL IMPRESSION / ASSESSMENT AND PLAN / ED COURSE  Pertinent labs & imaging results that were available during my care of the patient were reviewed by me and considered in my medical decision making (see chart for details).   Patient presented to the emergency department today with primary concern for rash.  On exam patient does have a rash to the lower half of her right calf.  There is a very marked linear and circumferential demarcation between the rash and normal skin.  No significant swelling on my exam.  The patient did also have concerns for some pain behind her knees and swelling and redness to her thighs.  D-dimer was checked and was negative.  At this point I doubt DVT or PE.  She also was found to be tachycardic here but states that the symptoms have been going on for 8 months.  She does have an appointment with to see cardiologist tomorrow.  Though it is primarily to discuss her lipid levels I did discuss with patient importance of her talking about the tachycardia with the cardiologist.  No signs of heart damage.  Will plan on discharging.  ____________________________________________   FINAL CLINICAL  IMPRESSION(S) / ED  DIAGNOSES  Final diagnoses:  Rash  Tachycardia     Note: This dictation was prepared with Dragon dictation. Any transcriptional errors that result from this process are unintentional     Nance Pear, MD 02/19/20 2011

## 2020-02-19 NOTE — Discharge Instructions (Addendum)
Please seek medical attention for any high fevers, chest pain, shortness of breath, change in behavior, persistent vomiting, bloody stool or any other new or concerning symptoms.  

## 2020-02-20 ENCOUNTER — Encounter: Payer: Self-pay | Admitting: Internal Medicine

## 2020-02-20 ENCOUNTER — Ambulatory Visit (INDEPENDENT_AMBULATORY_CARE_PROVIDER_SITE_OTHER): Payer: PPO | Admitting: Internal Medicine

## 2020-02-20 VITALS — BP 120/80 | HR 88 | Temp 97.0°F | Ht 69.0 in | Wt 232.2 lb

## 2020-02-20 DIAGNOSIS — M791 Myalgia, unspecified site: Secondary | ICD-10-CM

## 2020-02-20 DIAGNOSIS — E7849 Other hyperlipidemia: Secondary | ICD-10-CM | POA: Diagnosis not present

## 2020-02-20 DIAGNOSIS — T466X5A Adverse effect of antihyperlipidemic and antiarteriosclerotic drugs, initial encounter: Secondary | ICD-10-CM

## 2020-02-20 NOTE — Telephone Encounter (Signed)
Noted, patient evaluated in ED. Also to see cardiology today.

## 2020-02-20 NOTE — Patient Instructions (Signed)
Medication Instructions:  Dr. Debara Pickett recommends Repatha 140mg /mL (PCSK9). This is an injectable cholesterol medication self-administered once every 14 days. This medication will need prior approval with your insurance company, which we will work on. If the medication is not approved initially, we may need to do an appeal with your insurance. We will keep you updated on this process.   Administer medication in area of fatty tissue such as abdomen, outer thigh, back up of arm - and rotate site with each injection Store medication in refrigerator until ready to administer - allow to sit at room temp for 30 mins - 1 hour prior to injection Dispose of medication in a SHARPS container - your pharmacy should be able to direct you on this and proper disposal   If you need co-pay assistance, please look into the program at healthwellfoundation.org >> disease funds >> hypercholesterolemia. This is an online application or you can call to complete.   If you need a co-pay card for Repatha: [email or web address]-[email or web address] >> paying for Repatha If you need a co-pay card for Praluent: WedMap.it >> starting & paying for Praluent  *If you need a refill on your cardiac medications before your next appointment, please call your pharmacy*   Lab Work: FASTING lab work before your next visit with Dr. Debara Pickett to check cholesterol   If you have labs (blood work) drawn today and your tests are completely normal, you will receive your results only by: Marland Kitchen MyChart Message (if you have MyChart) OR . A paper copy in the mail If you have any lab test that is abnormal or we need to change your treatment, we will call you to review the results.   Testing/Procedures: Dr. Debara Pickett has ordered a CT coronary calcium score. This test is done at [address]. This is $150 out of pocket.   Coronary CalciumScan A coronary calcium scan is an imaging test used to look for deposits of calcium and other fatty materials (plaques) in the  inner lining of the blood vessels of the heart (coronary arteries). These deposits of calcium and plaques can partly clog and narrow the coronary arteries without producing any symptoms or warning signs. This puts a person at risk for a heart attack. This test can detect these deposits before symptoms develop. Tell a health care provider about:  Any allergies you have.  All medicines you are taking, including vitamins, herbs, eye drops, creams, and over-the-counter medicines.  Any problems you or family members have had with anesthetic medicines.  Any blood disorders you have.  Any surgeries you have had.  Any medical conditions you have.  Whether you are pregnant or may be pregnant. What are the risks? Generally, this is a safe procedure. However, problems may occur, including:  Harm to a pregnant woman and her unborn baby. This test involves the use of radiation. Radiation exposure can be dangerous to a pregnant woman and her unborn baby. If you are pregnant, you generally should not have this procedure done.  Slight increase in the risk of cancer. This is because of the radiation involved in the test. What happens before the procedure? No preparation is needed for this procedure. What happens during the procedure?  You will undress and remove any jewelry around your neck or chest.  You will put on a hospital gown.  Sticky electrodes will be placed on your chest. The electrodes will be connected to an electrocardiogram (ECG) machine to record a tracing of the electrical activity of  your heart.  A CT scanner will take pictures of your heart. During this time, you will be asked to lie still and hold your breath for 2-3 seconds while a picture of your heart is being taken. The procedure may vary among health care providers and hospitals. What happens after the procedure?  You can get dressed.  You can return to your normal activities.  It is up to you to get the results of your  test. Ask your health care provider, or the department that is doing the test, when your results will be ready. Summary  A coronary calcium scan is an imaging test used to look for deposits of calcium and other fatty materials (plaques) in the inner lining of the blood vessels of the heart (coronary arteries).  Generally, this is a safe procedure. Tell your health care provider if you are pregnant or may be pregnant.  No preparation is needed for this procedure.  A CT scanner will take pictures of your heart.  You can return to your normal activities after the scan is done. This information is not intended to replace advice given to you by your health care provider. Make sure you discuss any questions you have with your health care provider. Document Released: 04/30/2008 Document Revised: 09/21/2016 Document Reviewed: 09/21/2016 Elsevier Interactive Patient Education  2017 [place]: At [facility], you and your health needs are our priority.  As part of our continuing mission to provide you with exceptional heart care, we have created designated Provider Care Teams.  These Care Teams include your primary Cardiologist (physician) and Advanced Practice Providers (APPs -  Physician Assistants and Nurse Practitioners) who all work together to provide you with the care you need, when you need it.  We recommend signing up for the patient portal called "MyChart".  Sign up information is provided on this After Visit Summary.  MyChart is used to connect with patients for Virtual Visits (Telemedicine).  Patients are able to view lab/test results, encounter notes, upcoming appointments, etc.  Non-urgent messages can be sent to your provider as well.   To learn more about what you can do with MyChart, go to [email or web address].    Your next appointment:   3-4 month(s)  The format for your next appointment:   Either In Person or Virtual  Provider:   Dr. Lyman Bishop   Other Instructions

## 2020-02-21 ENCOUNTER — Encounter: Payer: Self-pay | Admitting: Internal Medicine

## 2020-02-21 NOTE — Progress Notes (Signed)
LIPID CLINIC CONSULT NOTE  Chief Complaint:  Manage dyslipidemia  Primary Care Physician: [name], NP  Primary Cardiologist:  No primary care provider on file.  HPI:  Emily Moon is a 59 y.o. female who is being seen today for the evaluation of dyslipidemia at the request of [name], NP.  This is a 59 year old female with longstanding dyslipidemia, infectious said even when she was very active at a younger age and weight loss, her cholesterol has always been high.  There is also concerning high cholesterol in the family, in both parents but more significant heart disease on her father side.  More recently her lipids were assessed and total cholesterol was 367, triglycerides 298, HDL 52 and LDL 255.  She had previously had stress testing back in 2012 which was a negative Myoview but has had no other cardiovascular evaluation.  Unfortunately, over time she has tried a number of different statin medications which she says caused her significant myalgias and she has been intolerant to them.  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  She has no known prior coronary artery disease.  She does have a history of MS and a number of related symptoms.  PMHx:  Past Medical History:  Diagnosis Date  . Arthritis    osteo  . Asthma   . Bipolar disorder ([place]) 05/21/14  . Cataracts, bilateral   . DDD (degenerative disc disease), cervical    also back  . Depression   . Dizziness    Positional  . Edema    feet/legs  . Fibromyalgia syndrome   . Fungal infection    Finger nails  . GERD (gastroesophageal reflux disease)   . Gout   . Headache    seasonal allergies  . Heart palpitations   . 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 Hypercholesterolemia   . Multiple sclerosis (HCC)    weakness  . Nephrolithiasis    kidney stones  . Osteoporosis    osteoarthritis  . Pneumonia   . PONV (postoperative nausea and  vomiting)    no problem after cataract surgery  . Psoriasis   . Renal stone   . Shortness of breath dyspnea    wheezing  . Sleep apnea 2012   sleep study / slight, no interventions  . Urinary frequency     Past Surgical History:  Procedure Laterality Date  . CATARACT EXTRACTION W/PHACO Left 05/21/2015   Procedure: CATARACT EXTRACTION PHACO AND INTRAOCULAR LENS PLACEMENT (IOC);  Surgeon: Birder Robson, MD;  Location: [facility];  Service: Ophthalmology;  Laterality: Left;  [place] 00:35 AP% 22.9 CDE 8.11 fluid pack lot [id number]  . CATARACT EXTRACTION W/PHACO Right 06/04/2015   Procedure: CATARACT EXTRACTION PHACO AND INTRAOCULAR LENS PLACEMENT (IOC);  Surgeon: Birder Robson, MD;  Location: [facility];  Service: Ophthalmology;  Laterality: Right;  US:00:48 AP%: 10.5 CDE:5.08 Fluid lot [id number]  . CYSTOSCOPY/URETEROSCOPY/HOLMIUM LASER/STENT PLACEMENT Bilateral 09/22/2016   Procedure: CYSTOSCOPY/URETEROSCOPY/HOLMIUM LASER/STENT PLACEMENT;  Surgeon: Hollice Espy, MD;  Location: [facility];  Service: Urology;  Laterality: Bilateral;  . EYE SURGERY  2015   tissue biopsy  . FOOT SURGERY  2015  . JOINT REPLACEMENT Left 2013   hip replacement  . LITHOTRIPSY    . PTOSIS REPAIR Bilateral 02/18/2016   Procedure: BILATERAL PTOSIS REPAIR UPPER EYELIDS;  Surgeon: Karle Starch, MD;  Location: [facility];  Service: Ophthalmology;  Laterality: Bilateral;  LEAVE PT EARLY AM  .  thumb surgery Right   . TONSILLECTOMY  1973    FAMHx:  Family History  Problem Relation Age of Onset  . Cancer Father        Abdomen with mastasis  . Cancer Mother   . Heart disease Mother   . Kidney disease Neg Hx   . Bladder Cancer Neg Hx   . Prostate cancer Neg Hx   . Kidney cancer Neg Hx     SOCHx:   reports that she quit smoking about 7 years ago. Her smoking use included cigarettes. She quit after 25.00 years of use. She has never used smokeless tobacco. She reports that she does not drink alcohol or use  drugs.  ALLERGIES:  Allergies  Allergen Reactions  . Albuterol Shortness Of Breath and Other (See Comments)    Makes pt feel jittery/ tacycardic  . Crestor [Rosuvastatin] Other (See Comments)    Joint pain, muscle pain, and hair loss  . Halcion [Triazolam] Other (See Comments)    Dizziness,headaches,bladder problems  . Levaquin [Levofloxacin In D5w] Diarrhea and Itching    Shoulder pain  . Naproxen Sodium Swelling    Patient tolerates in small doses  . Tylenol [Acetaminophen] Swelling    Patient tolerates in small doses  . Cefaclor Other (See Comments)    Doesn't remember---unsure if actually allergic   . Diclofenac Sodium Other (See Comments)    "made very sick"  . Sulfa Antibiotics Itching    Unsure of reaction possibly itching  . Tramadol Itching and Nausea And Vomiting  . Aripiprazole Other (See Comments)    Muscle tension/cramping  . Ibuprofen Swelling    Patient tolerates in small doses    ROS: Pertinent items noted in HPI and remainder of comprehensive ROS otherwise negative.  HOME MEDS: Current Outpatient Medications on File Prior to Visit  Medication Sig Dispense Refill  . ASPERCREME LIDOCAINE EX Apply 1 application topically 4 (four) times daily as needed (for pain.).     Marland Kitchen aspirin 325 MG tablet Take 325 mg by mouth every 4 (four) hours as needed for headache.     . Azelastine HCl 137 MCG/SPRAY SOLN PLACE 2 SPRAYS INTO BOTH NOSTRILS 2 (TWO) TIMES DAILY 90 mL 0  . BIOTIN PO Take 200 mg by mouth daily.     . cetirizine (ZYRTEC) 10 MG tablet Take 10 mg by mouth 2 (two) times daily.    . Cholecalciferol (VITAMIN D3) 10000 units capsule Take 30,000 Units by mouth daily.     . ciclopirox (LOPROX) 0.77 % cream Apply topically 2 (two) times daily. 30 g 0  . cyanocobalamin (,VITAMIN B-12,) 1000 MCG/ML injection Inject 1 mL (1,000 mcg total) into the muscle every 3 (three) months. 1 mL 0  . desipramine (NORPRAMIN) 25 MG tablet TAKE 1 TABLET BY MOUTH IN THE MORNING 90  tablet 1  . diclofenac sodium (VOLTAREN) 1 % GEL Apply 4 g topically 4 (four) times daily. 500 g 5  . docusate sodium (COLACE) 100 MG capsule Take 1 capsule (100 mg total) by mouth 2 (two) times daily. 60 capsule 0  . doxycycline (PERIOSTAT) 20 MG tablet Take 20 mg by mouth 2 (two) times daily.    . DULoxetine (CYMBALTA) 20 MG capsule Take 2 capsules (40 mg total) by mouth daily. 60 capsule 5  . hydrocortisone 2.5 % cream Apply BID to affected area in underarms and groin prn flares    . hydroquinone 4 % cream 1 APPLICATION TOPICALLY DAILY AS NEEDED FOR BLEMISHES.  3  . hydrOXYzine (VISTARIL) 50 MG capsule TAKE 1 CAPSLE BY MOUTH AT NOON, 1 AT 6 PM, AND 1 AS NEEDED 270 capsule 2  . ketorolac (TORADOL) 10 MG tablet Take 1 tablet (10 mg total) by mouth every 8 (eight) hours as needed. 30 tablet 1  . lamoTRIgine (LAMICTAL) 150 MG tablet Take 2 tablets (300 mg total) by mouth at bedtime. 60 tablet 5  . levalbuterol (XOPENEX HFA) 45 MCG/ACT inhaler Inhale 1-2 puffs into the lungs every 6 (six) hours as needed for wheezing. 1 Inhaler 0  . levalbuterol (XOPENEX) 1.25 MG/3ML nebulizer solution USE 1 VIAL VIA NEBULIZER EVERY 4 HOURS AS NEEDED FOR WHEEZING OR SHORTNESS OF BREATH 270 mL 1  . Lysine 1000 MG TABS Take 2,000-4,000 mg by mouth at bedtime. 2000 mg scheduled at bedtime and patient will take 4000 mg if she has outbreak    . Magnesium 500 MG TABS Take 1 tablet by mouth at bedtime.    Marland Kitchen neomycin-bacitracin-polymyxin (NEOSPORIN) [phone] ointment Apply 1 application topically 4 (four) times daily as needed (for cut/scrapes.).    Marland Kitchen pantoprazole (PROTONIX) 40 MG tablet TAKE 1 TABLET BY MOUTH EVERY DAY 90 tablet 1  . phenazopyridine (AZO-TABS) 95 MG tablet Take 95 mg by mouth 3 (three) times daily as needed for pain.    . polyethylene glycol powder (GLYCOLAX/MIRALAX) powder MIX 17 GRAMS (1 CAPFUL) WITH 4-8 OZ OF LIQUID AND TAKE BY MOUTH TWICE DAILY AS NEEDED 527 g 0  . temazepam (RESTORIL) 30 MG capsule  TAKE ONE TO TWO CAPSULES BY MOUTH NIGHTLY AT BEDTIME 60 capsule 4  . tiZANidine (ZANAFLEX) 4 MG tablet TAKE 2 TABLETS (8 MG TOTAL) BY MOUTH EVERY 6 (SIX) HOURS AS NEEDED FOR MUSCLE SPASMS. 240 tablet 0  . traZODone (DESYREL) 50 MG tablet Take 2 tablets (100 mg total) by mouth at bedtime. 60 tablet 7   No current facility-administered medications on file prior to visit.    LABS/IMAGING: Results for orders placed or performed during the hospital encounter of 02/19/20 (from the past 48 hour(s))  CBC with Differential     Status: Abnormal   Collection Time: 02/19/20  3:53 PM  Result Value Ref Range   WBC 8.3 4.0 - 10.5 K/uL   RBC 4.62 3.87 - 5.11 MIL/uL   Hemoglobin 14.2 12.0 - 15.0 g/dL   HCT 42.2 36.0 - 46.0 %   MCV 91.3 80.0 - 100.0 fL   MCH 30.7 26.0 - 34.0 pg   MCHC 33.6 30.0 - 36.0 g/dL   RDW 12.7 11.5 - 15.5 %   Platelets 352 150 - 400 K/uL   nRBC 0.0 0.0 - 0.2 %   Neutrophils Relative % 67 %   Neutro Abs 5.6 1.7 - 7.7 K/uL   Lymphocytes Relative 21 %   Lymphs Abs 1.8 0.7 - 4.0 K/uL   Monocytes Relative 5 %   Monocytes Absolute 0.4 0.1 - 1.0 K/uL   Eosinophils Relative 4 %   Eosinophils Absolute 0.4 0.0 - 0.5 K/uL   Basophils Relative 1 %   Basophils Absolute 0.1 0.0 - 0.1 K/uL   Immature Granulocytes 2 %   Abs Immature Granulocytes 0.13 (H) 0.00 - 0.07 K/uL    Comment: Performed at [facility], [address] [place], [place] [address]  Comprehensive metabolic panel     Status: Abnormal   Collection Time: 02/19/20  3:53 PM  Result Value Ref Range   Sodium 139 135 - 145 mmol/L   Potassium  3.8 3.5 - 5.1 mmol/L   Chloride 103 98 - 111 mmol/L   CO2 25 22 - 32 mmol/L   Glucose, Bld 128 (H) 70 - 99 mg/dL    Comment: Glucose reference range applies only to samples taken after fasting for at least 8 hours.   BUN 20 6 - 20 mg/dL   Creatinine, Ser 0.91 0.44 - 1.00 mg/dL   Calcium 9.2 8.9 - 10.3 mg/dL   Total Protein 7.6 6.5 - 8.1 g/dL   Albumin 4.5 3.5 - 5.0 g/dL    AST 20 15 - 41 U/L   ALT 19 0 - 44 U/L   Alkaline Phosphatase 75 38 - 126 U/L   Total Bilirubin 0.8 0.3 - 1.2 mg/dL   GFR calc non Af Amer >60 >60 mL/min   GFR calc Af Amer >60 >60 mL/min   Anion gap 11 5 - 15    Comment: Performed at [facility], [place], [place] [address]  Troponin I (High Sensitivity)     Status: None   Collection Time: 02/19/20  3:53 PM  Result Value Ref Range   Troponin I (High Sensitivity) 4 <18 ng/L    Comment: (NOTE) Elevated high sensitivity troponin I (hsTnI) values and significant  changes across serial measurements may suggest ACS but many other  chronic and acute conditions are known to elevate hsTnI results.  Refer to the "Links" section for chest pain algorithms and additional  guidance. Performed at [facility], [address] [place], [place] [address]   Brain natriuretic peptide     Status: None   Collection Time: 02/19/20  3:53 PM  Result Value Ref Range   B Natriuretic Peptide 22.0 0.0 - 100.0 pg/mL    Comment: Performed at [facility], [address] [place], [place] [address]  TSH     Status: None   Collection Time: 02/19/20  3:53 PM  Result Value Ref Range   TSH 0.902 0.350 - 4.500 uIU/mL    Comment: Performed by a 3rd Generation assay with a functional sensitivity of <=0.01 uIU/mL. Performed at [facility], [address] [place], [place] [address]   T4, free     Status: None   Collection Time: 02/19/20  3:53 PM  Result Value Ref Range   Free T4 1.08 0.61 - 1.12 ng/dL    Comment: (NOTE) Biotin ingestion may interfere with free T4 tests. If the results are inconsistent with the TSH level, previous test results, or the clinical presentation, then consider biotin interference. If needed, order repeat testing after stopping biotin. Performed at [facility], [address] [place],  [address]   Fibrin derivatives D-[facility] only)     Status: None    Collection Time: 02/19/20  3:53 PM  Result Value Ref Range   Fibrin derivatives D-dimer ([facility]) 449.53 0.00 - 499.00 ng/mL (FEU)    Comment: (NOTE) <> Exclusion of Venous Thromboembolism (VTE) - OUTPATIENT ONLY   (Emergency Department or [place])   0-499 ng/ml (FEU): With a low to intermediate pretest probability                      for VTE this test result excludes the diagnosis                      of VTE.   >499 ng/ml (FEU) : VTE not excluded; additional work up for VTE is  required. <> Testing on Inpatients and Evaluation of Disseminated Intravascular   Coagulation (DIC) Reference Range:   0-499 ng/ml (FEU) Performed at [facility], [address] [place], [place] [address]    *Note: Due to a large number of results and/or encounters for the requested time period, some results have not been displayed. A complete set of results can be found in Results Review.   DG Chest 2 View  Result Date: 02/19/2020 CLINICAL DATA:  Short of breath tachycardia EXAM: CHEST - 2 VIEW COMPARISON:  12/21/2018 FINDINGS: The heart size and mediastinal contours are within normal limits. Both lungs are clear. The visualized skeletal structures are unremarkable. IMPRESSION: No active cardiopulmonary disease. Electronically Signed   By: Franchot Gallo M.D.   On: 02/19/2020 16:20    LIPID PANEL:    Component Value Date/Time   CHOL 367 (H) 01/22/2020 1432   TRIG 298.0 (H) 01/22/2020 1432   HDL 52.60 01/22/2020 1432   CHOLHDL 7 01/22/2020 1432   VLDL 59.6 (H) 01/22/2020 1432   LDLCALC 223 (H) 11/13/2014 0908   LDLDIRECT 255.0 01/22/2020 1432    WEIGHTS: Wt Readings from Last 3 Encounters:  02/20/20 232 lb 3.2 oz (105.3 kg)  02/19/20 230 lb (104.3 kg)  01/25/20 235 lb 4 oz (106.7 kg)    VITALS: BP 120/80   Pulse 88   Temp (!) 97 F (36.1 C)   Ht 5\' 9"  (1.753 m)   Wt 232 lb 3.2 oz (105.3 kg)   LMP 08/23/2014   SpO2 95%   BMI 34.29 kg/m   EXAM: General  appearance: alert and no distress Neck: no JVD, supple, symmetrical, trachea midline and thyroid not enlarged, symmetric, no tenderness/mass/nodules Lungs: clear to auscultation bilaterally Heart: regular rate and rhythm Abdomen: soft, non-tender; bowel sounds normal; no masses,  no organomegaly Extremities: extremities normal, atraumatic, no cyanosis or edema Pulses: 2+ and symmetric Skin: Skin color, texture, turgor normal. No rashes or lesions Neurologic: Mental status: Alert, oriented, thought content appropriate Psych: Pleasant  EKG: Deferred  ASSESSMENT: 1. Probable FH-Dutch score 6 2. Family history of high cholesterol 3. Statin intolerant-myalgias 4. Multiple sclerosis  PLAN: 1.   Ms. Peerenboom has a very high cholesterol and may have familial hyperlipidemia.  Her triglycerides are also high which could be dietary or perhaps familial combined hyperlipidemia.  There is not clear evidence of early onset heart disease however there is significantly elevated cholesterol in the family concerning for FH.  She also reports her cholesterol has been "always high" despite being less weight and more physically active.  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  Unfortunately she struggles with MS.  I would like to further evaluate for coronary artery disease and would recommend coronary calcium scoring.  Nevertheless she meets guideline recommendations for 50% reduction in her cholesterol.  She has been statin intolerant causing significant myalgias and worsening of her MS symptoms, therefore she is a good candidate for PCSK9 inhibitor.  This is also indicated due to [place].  Would recommend Repatha 140 mg every 2 weeks and plan repeat lipids in about 3 months with follow-up at that time.  Thanks again for the kind referral.  Pixie Casino, MD, FACC, [place] Director of the [facility] Diplomate of the  American Board of Clinical Lipidology Attending Cardiologist  Direct Dial: [phone]  Fax: [phone]  Website:  [email or web address]  02/21/2020, 1:21 PM

## 2020-02-22 ENCOUNTER — Ambulatory Visit: Payer: PPO | Admitting: Dermatology

## 2020-02-22 ENCOUNTER — Other Ambulatory Visit: Payer: Self-pay

## 2020-02-22 DIAGNOSIS — L304 Erythema intertrigo: Secondary | ICD-10-CM | POA: Diagnosis not present

## 2020-02-22 DIAGNOSIS — D485 Neoplasm of uncertain behavior of skin: Secondary | ICD-10-CM

## 2020-02-22 DIAGNOSIS — L710 Perioral dermatitis: Secondary | ICD-10-CM

## 2020-02-22 DIAGNOSIS — L603 Nail dystrophy: Secondary | ICD-10-CM

## 2020-02-22 DIAGNOSIS — L219 Seborrheic dermatitis, unspecified: Secondary | ICD-10-CM | POA: Diagnosis not present

## 2020-02-22 DIAGNOSIS — D489 Neoplasm of uncertain behavior, unspecified: Secondary | ICD-10-CM

## 2020-02-22 DIAGNOSIS — L309 Dermatitis, unspecified: Secondary | ICD-10-CM | POA: Diagnosis not present

## 2020-02-22 DIAGNOSIS — L509 Urticaria, unspecified: Secondary | ICD-10-CM

## 2020-02-22 MED ORDER — CLOBETASOL PROPIONATE 0.05 % EX SOLN: CUTANEOUS | 0 refills | Status: DC

## 2020-02-22 MED ORDER — FAMOTIDINE 20 MG PO TABS: 20.0000 mg | ORAL_TABLET | Freq: Every day | ORAL | 2 refills | Status: DC

## 2020-02-22 MED ORDER — FLUOCINONIDE 0.05 % EX SOLN: 1.0000 "application " | Freq: Two times a day (BID) | CUTANEOUS | 3 refills | Status: DC | PRN

## 2020-02-22 NOTE — Progress Notes (Signed)
Follow-Up Visit   Subjective  Emily Moon is a 59 y.o. female who presents for the following: Rash (getting worse, Right side, a traveling itch.  4 months has been using a medicated dandruff shampoo that seems to work.) and Raised red marks with itch (has been there for a few months, pt thinks that moisture makes itch there worse, has been taking zyrtec BID along with taking protonix which she believes helps the itch.).  Patient has a history of MS  The following portions of the chart were reviewed this encounter and updated as appropriate: Tobacco  Allergies  Meds  Problems  Med Hx  Surg Hx  Fam Hx      Review of Systems: No other skin or systemic complaints.  Objective  Well appearing patient in no apparent distress; mood and affect are within normal limits.  A full examination was performed including scalp, head, eyes, ears, nose, lips, neck, chest, axillae, abdomen, back, buttocks, bilateral upper extremities, bilateral lower extremities, hands, feet, fingers, toes, fingernails, and toenails. All findings within normal limits unless otherwise noted below.  Objective  Right Abdomen (side) - Upper: Thin scaly pink plaques  Objective  Bilatera supapubic area, Lower abdomen: Mild erythema  Objective  bilateral Thumb: Transverse ridges at middle of nail  Objective  Left Thigh - Anterior: Thin scaly pink plaque  Objective  Left Lower Cutaneous Lip: Inflamed papules at chin  Objective  Scalp: Pink patches with greasy scale.   Objective  Abdomen: Erythematous edematous pink plaques  Assessment & Plan  Eczema, unspecified type Right Abdomen (side) - Upper  Chronic, flared.  Patient did not use triamcinolone much.  Will start Clobetasol Solution mixed with CeraVe cream twice a day as needed to areas of the body with rash or itch. Avoid applying to face, groin, and axilla. Use as directed. Risk of skin atrophy with long-term use reviewed.  Counseled must  be used consistently to see the improvement - it is not an instant anti-itch cream.   Pour one 26ml bottle of clobetasol 0.05% solution into jar, mix well. Label this jar to indicate the medication has been added. Use twice daily to affected areas. Do not apply to face, groin or underarms.  Moisturizer may burn or sting initially. Try for at least 4 weeks.   Topical steroids (such as triamcinolone, fluocinolone, fluocinonide, mometasone, clobetasol, halobetasol, betamethasone, hydrocortisone) can cause thinning and lightening of the skin if they are used for too long in the same area. Your physician has selected the right strength medicine for your problem and area affected on the body. Please use your medication only as directed by your physician to prevent side effects.   May contiue using Benadryl gel for itch   clobetasol (TEMOVATE) 0.05 % external solution - Right Abdomen (side) - Upper  Erythema intertrigo (2) Lower abdomen; Bilatera supapubic area  Start using Hydrocortisone cream 2.5% twice daily for [redacted] week along with the ketoconazole cream or antifungal powder.  Topical steroids (such as triamcinolone, fluocinolone, fluocinonide, mometasone, clobetasol, halobetasol, betamethasone, hydrocortisone) can cause thinning and lightening of the skin if they are used for too long in the same area. Your physician has selected the right strength medicine for your problem and area affected on the body. Please use your medication only as directed by your physician to prevent side effects.    Median canaliform nail dystrophy bilateral Thumb  /habit tic deformity  Avoid pressure and rubbing  Benign, reassured   Neoplasm of uncertain behavior  Left Thigh - Anterior  Dermatitis vs ISK  Benign-appearing.  Observation.  Call clinic for new or changing moles.  Recommend daily use of broad spectrum spf 30+ sunscreen to sun-exposed areas.    Perioral dermatitis Left Lower Cutaneous  Lip  Will restart doxycycline 20mg  twice daily with food  Seborrheic dermatitis Scalp  Chronic, not at goal  Patient uses [facility], will continue using   Start Fluocinonide solution Apply to scalp twice daily as need for itch or scale. Avoid applying to face, groin, and axilla. Use as directed. Risk of skin atrophy with long-term use reviewed.    fluocinonide (LIDEX) 0.05 % external solution - Scalp  Urticaria Abdomen  Reviewed CMP, CBC with Diff, TSH, and T4 labs, unremarkable  Start famotidine 20mg   for itch daily Increase zyrtec 10 mg 2 tabs twice daily Cant tolerated Allegra due to cramps May consider more labs if itch is persistent at follow up May consider singulair at follow-up  Ordered Medications: famotidine (PEPCID) 20 MG tablet  Return for 3 to 4 weeks.   IDonzetta Kohut, CMA, am acting as scribe for Forest Gleason, MD .  Documentation: I have reviewed the above documentation for accuracy and completeness, and I agree with the above.  Forest Gleason, MD

## 2020-02-22 NOTE — Patient Instructions (Addendum)
Eczema Skin Care   Pour one 74ml bottle of clobetasol 0.05% solution into jar, mix well. Label this jar to indicate the medication has been added. Use twice daily to affected areas. Do not apply to face, groin or underarms.  Moisturizer may burn or sting initially. Try for at least 4 weeks.    SCALP  Will continue Selsun Blue shampoo  Start using fluocinonide solution twice daily to scalp per itch  GROIN  Start using Hydrocortisone cream 2.5% twice daily for [redacted] week along with the ketoconazole cream or antifungal powder.  FACE Will restart the Doxycycline 20 mg twice daily with food  ITCH Start famotidine 20mg   for itch daily Increase zyrtec 10 mg 2 tabs twice daily Cant tolerated Allegra due to cramps May consider more labs if itch is persistent at follow up  Topical steroids (such as triamcinolone, fluocinolone, fluocinonide, mometasone, clobetasol, halobetasol, betamethasone, hydrocortisone) can cause thinning and lightening of the skin if they are used for too long in the same area. Your physician has selected the right strength medicine for your problem and area affected on the body. Please use your medication only as directed by your physician to prevent side effects.   Doxycycline should be taken with food to prevent nausea. Do not lay down for 30 minutes after taking. Be cautious with sun exposure and use good sun protection while on this medication. Pregnant women should not take this medication.

## 2020-02-23 ENCOUNTER — Ambulatory Visit
Admission: RE | Admit: 2020-02-23 | Discharge: 2020-02-23 | Disposition: A | Discharge status: Home / Self Care | Payer: PPO | Source: Ambulatory Visit | Attending: Psychiatry | Admitting: Psychiatry

## 2020-02-23 ENCOUNTER — Other Ambulatory Visit: Payer: Self-pay | Admitting: Primary Care

## 2020-02-23 ENCOUNTER — Telehealth: Payer: Self-pay | Admitting: Internal Medicine

## 2020-02-23 DIAGNOSIS — G35 Multiple sclerosis: Secondary | ICD-10-CM | POA: Insufficient documentation

## 2020-02-23 DIAGNOSIS — M4802 Spinal stenosis, cervical region: Secondary | ICD-10-CM | POA: Diagnosis not present

## 2020-02-23 DIAGNOSIS — K219 Gastro-esophageal reflux disease without esophagitis: Secondary | ICD-10-CM

## 2020-02-23 DIAGNOSIS — M50223 Other cervical disc displacement at C6-C7 level: Secondary | ICD-10-CM | POA: Diagnosis not present

## 2020-02-23 IMAGING — MR MR HEAD WO/W CM
12 of 15 series · 38 of 48 positions shown · IV contrast (gadavist)
Comparison: None available.

CLINICAL DATA: Initial evaluation for multiple sclerosis. Patient
with difficulty walking, decrease in fine motor skills, extremity
numbness and paresthesias, incontinence.

EXAM:
MRI HEAD WITHOUT AND WITH CONTRAST
MRI CERVICAL SPINE WITHOUT AND WITH CONTRAST
TECHNIQUE: Multiplanar, multiecho pulse sequences of the brain and surrounding
structures, and cervical spine, to include the craniocervical
junction and cervicothoracic junction, were obtained without and
with intravenous contrast.
CONTRAST:  10mL GADAVIST GADOBUTROL 1 MMOL/ML IV SOLN

[Series 5: ax dwi_tracew · axial · 3.0mm · 0.60mm/px · z∈[-124,+31]mm · 3 of 48 slices shown]
[im 1/48]
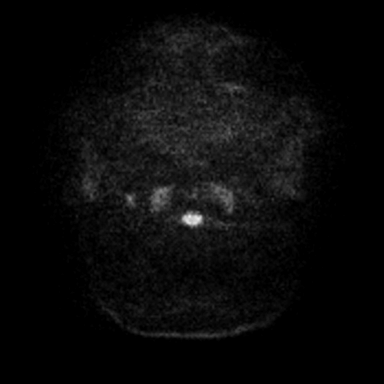
[im 24/48]
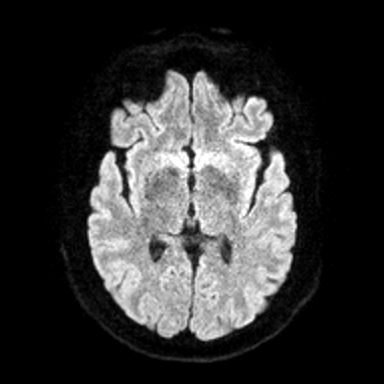
[im 48/48]
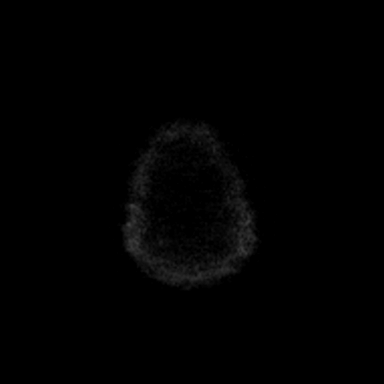

[Series 6: ax dwi_adc · axial · 3.0mm · 0.60mm/px · z∈[-124,+31]mm · 3 of 48 slices shown]
[im 1/48]
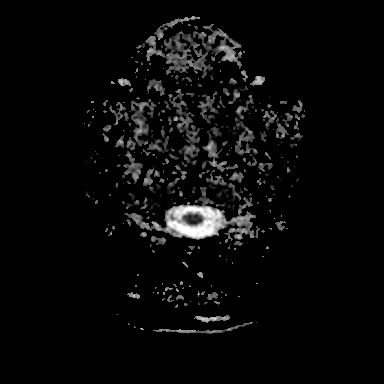
[im 24/48]
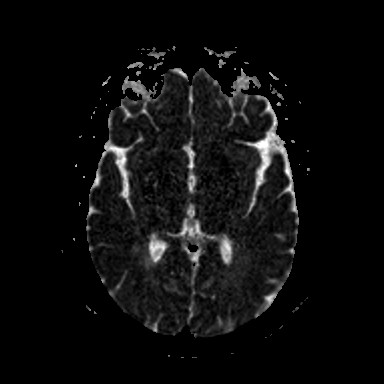
[im 48/48]
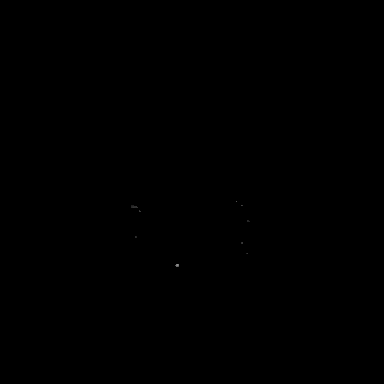

[Series 7: cor dwi_tracew · coronal · 5.0mm · 0.60mm/px · 2 of 36 slices shown]
[im 1/36]
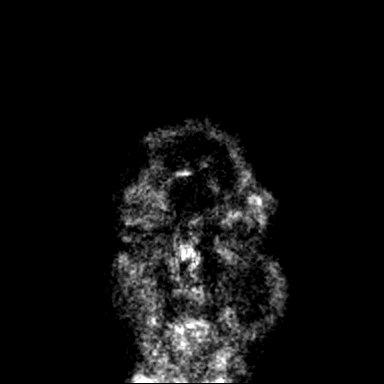
[im 36/36]
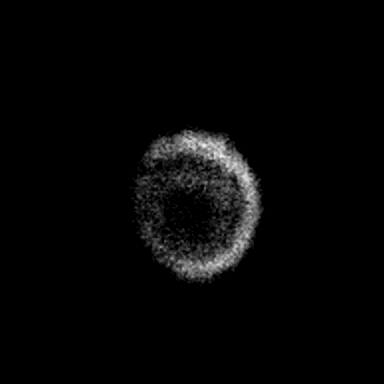

[Series 8: cor dwi_adc · coronal · 5.0mm · 0.60mm/px · 2 of 34 slices shown]
[im 1/34]
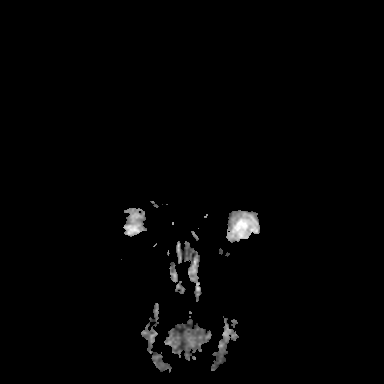
[im 34/34]
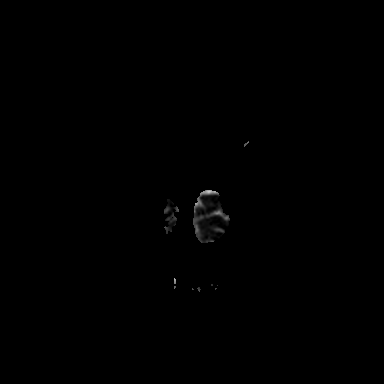

[Series 9: T1 · sagittal · 5.0mm · 0.62mm/px · 1 of 22 slices shown (1 of 2)]
[im 1/22]
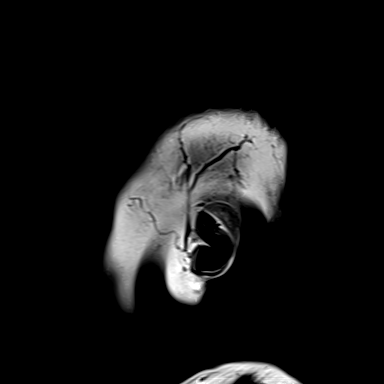

[Series 10: FLAIR · sagittal · 5.0mm · 0.94mm/px · 1 of 22 slices shown (1 of 2)]
[im 1/22]
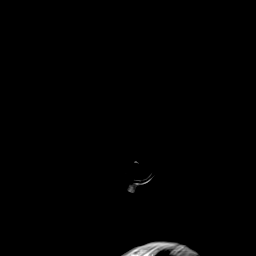

[Series 11: T2 · axial · 5.0mm · 0.53mm/px · z∈[-124,+32]mm · 2 of 27 slices shown (1 of 2)]
[im 1/27]
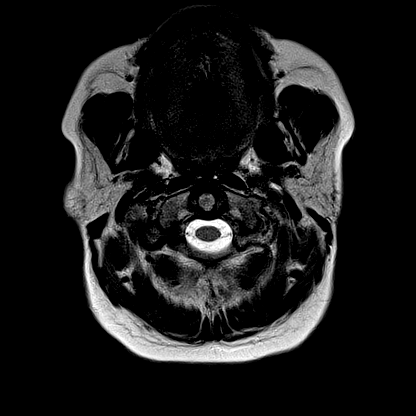
[im 27/27]
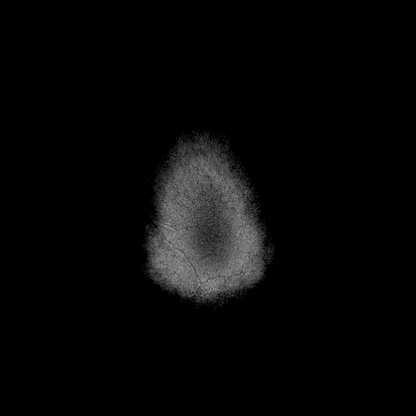

[Series 16: FLAIR · axial · 3.0mm · 0.53mm/px · z∈[-127,+35]mm · 3 of 55 slices shown (2 of 2)]
[im 1/55]
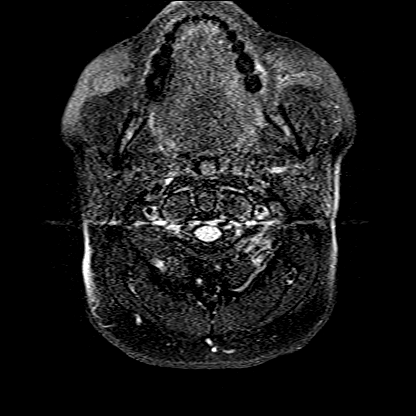
[im 28/55]
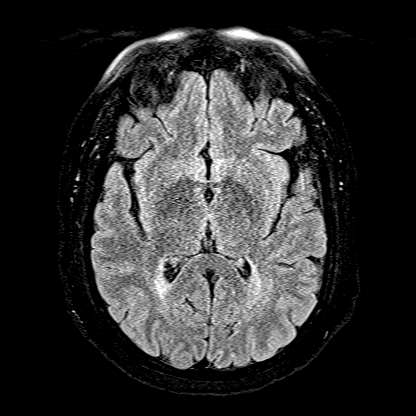
[im 55/55]
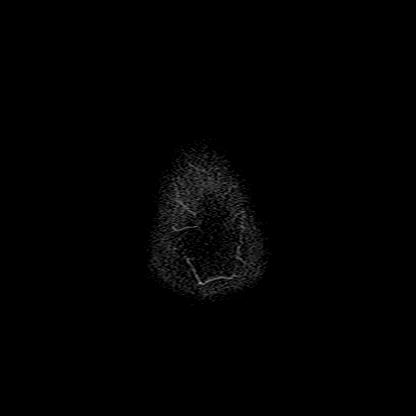

[Series 17: T1 · axial · 1.0mm · 0.98mm/px · z∈[-126,+32]mm · 8 of 160 slices shown (2 of 2)]
[im 1/160]
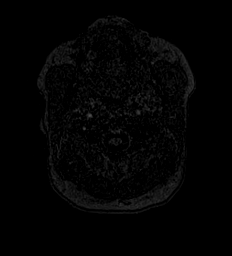
[im 20/160]
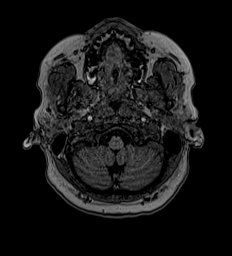
[im 40/160]
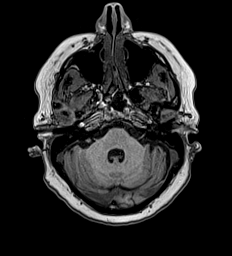
[im 60/160]
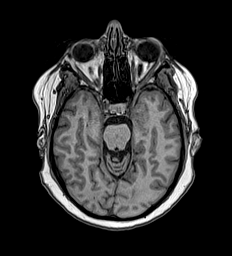
[im 100/160]
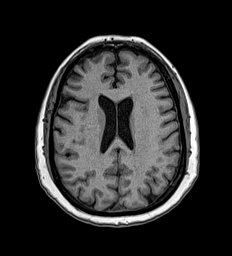
[im 120/160]
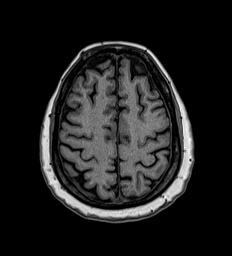
[im 140/160]
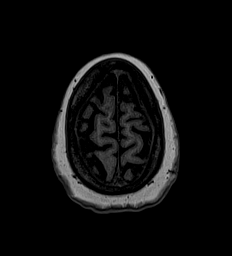
[im 160/160]
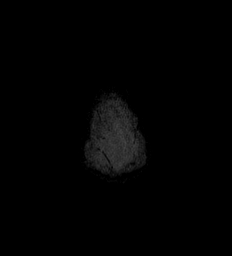

[Series 18: T2 · coronal · 5.0mm · 0.57mm/px · 2 of 28 slices shown (2 of 2)]
[im 1/28]
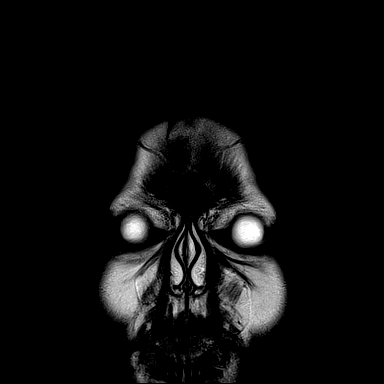
[im 28/28]
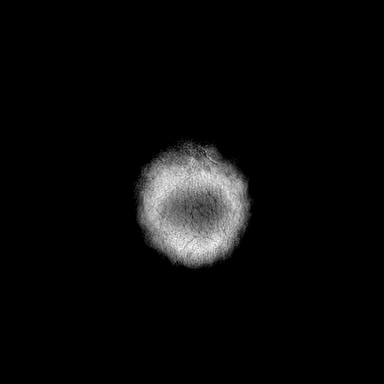

[Series 19: T1 post-contrast · axial · 1.0mm · 0.98mm/px · z∈[-126,+32]mm · 9 of 160 slices shown (1 of 2)]
[im 1/160]
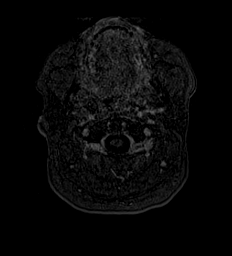
[im 20/160]
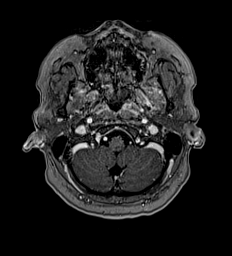
[im 40/160]
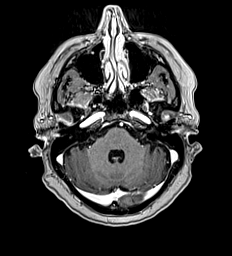
[im 60/160]
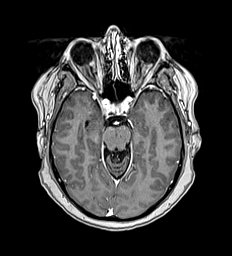
[im 80/160]
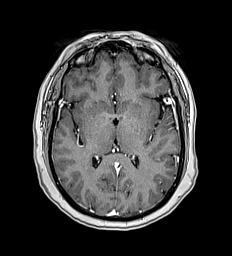
[im 100/160]
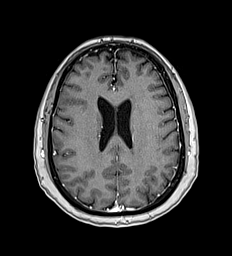
[im 120/160]
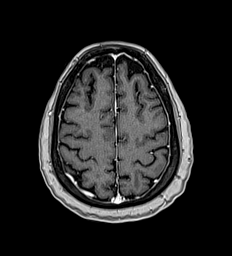
[im 140/160]
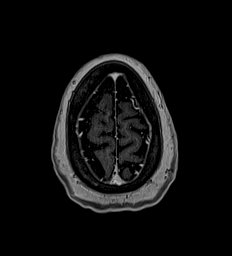
[im 160/160]
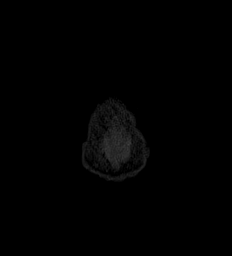

[Series 20: T1 post-contrast · coronal · 5.0mm · 0.57mm/px · 2 of 28 slices shown (2 of 2)]
[im 1/28]
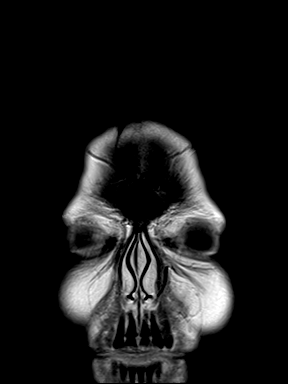
[im 28/28]
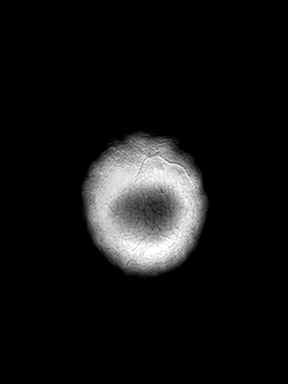

[38 of 48 positions shown; findings below may reference images not displayed]

FINDINGS: MRI HEAD FINDINGS

Brain: Cerebral volume within normal limits. Multiple scattered foci
of T2/FLAIR hyperintensity seen involving the periventricular, deep,
and juxta cortical white matter of both cerebral hemispheres,
slightly greater on the left. Few of these foci are oriented
perpendicular to the lateral ventricles. Associated callososeptal
involvement with scattered T1 black holes. No infratentorial foci
identified. Findings consistent with history of demyelinating
disease/multiple sclerosis. No associated restricted diffusion or
enhancement to suggest active demyelination.

No evidence for acute infarct. Gray-white matter differentiation
maintained. No encephalomalacia to suggest chronic infarction. No
foci of susceptibility artifact to suggest acute or chronic
intracranial hemorrhage.

No mass lesion, midline shift or mass effect. No hydrocephalus or
extra-axial fluid collection. Pituitary gland suprasellar region
normal. Midline structures intact and normal. No other abnormal
enhancement.

Vascular: Major intracranial vascular flow voids are well
maintained.

Skull and upper cervical spine: Craniocervical junction normal. Bone
marrow signal intensity within normal limits. No scalp soft tissue
abnormality.

Sinuses/Orbits: Globes and orbital soft tissues within normal
limits. Patient status post bilateral ocular lens replacement.
Paranasal sinuses are clear. No significant mastoid effusion.

Other: None.

MRI CERVICAL SPINE FINDINGS

Alignment: Straightening with slight reversal of the normal cervical
lordosis. Trace anterolisthesis of C3 on C4, C7 on T1, and T1 on T2,
chronic and facet mediated.

Vertebrae: Vertebral body height maintained without evidence for
acute or chronic fracture. Bone marrow signal intensity within
normal limits. No worrisome osseous lesions. No abnormal marrow
edema or enhancement.

Cord: Signal intensity within the cervical spinal cord is normal. No
cord signal changes to suggest demyelinating disease. No abnormal
enhancement.

Posterior Fossa, vertebral arteries, paraspinal tissues:
Craniocervical junction within normal limits. Paraspinous and
prevertebral soft tissues are normal. Normal intravascular flow
voids seen within the vertebral arteries bilaterally.

Disc levels:

C2-C3: Mild right-sided uncovertebral hypertrophy without
significant disc bulge. No stenosis.

C3-C4: Trace anterolisthesis. Diffuse disc bulge with bilateral
uncovertebral hypertrophy. Right-sided facet degeneration.
Flattening of the ventral thecal sac with resultant mild spinal
stenosis, greater on the right. Moderate right with mild left C4
foraminal stenosis.

C4-C5: Chronic diffuse degenerative disc osteophyte with
intervertebral disc space narrowing. Flattening and effacement of
the ventral thecal sac with resultant moderate spinal stenosis. Mild
cord flattening without cord signal changes. Severe left greater
than right C5 foraminal stenosis.

C5-C6: Chronic diffuse degenerative disc osteophyte with
intervertebral disc space narrowing. Flattening and effacement of
the ventral thecal sac with resultant moderate spinal stenosis.
Severe bilateral C6 foraminal stenosis.

C6-C7: Mild disc bulge. Superimposed left foraminal disc osteophyte
complex extends into the left neural foramen (series 14, image 21).
Resultant severe left C7 foraminal stenosis. Central canal remains
patent. No significant right foraminal narrowing.

C7-T1:  Negative interspace.  Mild facet hypertrophy.  No stenosis.

Visualized upper thoracic spine demonstrates no significant finding.
IMPRESSION: MRI HEAD IMPRESSION:

1. Scattered multifocal foci of T2/FLAIR hyperintensities involving
the left greater than right cerebral hemispheres, compatible with
history of multiple sclerosis. No evidence for active demyelination.
2. Otherwise normal brain MRI for age.

MRI CERVICAL SPINE IMPRESSION:

1. Normal MRI appearance of the cervical spinal cord. No evidence
for demyelinating disease.
2. Degenerative spondylosis at C4-5 and C5-6 with resultant moderate
spinal stenosis, with severe bilateral C5 and C6 foraminal
narrowing.
3. Left foraminal disc osteophyte complex at C6-7 with resultant
severe left C7 foraminal stenosis.
4. Right eccentric disc bulge with uncovertebral spurring at C3-4
with resultant moderate right C4 foraminal narrowing.

## 2020-02-23 IMAGING — MR MR CERVICAL SPINE WO/W CM
5 of 8 series · 26 of 48 positions shown · IV contrast (gadavist)
Comparison: None available.

CLINICAL DATA: Initial evaluation for multiple sclerosis. Patient
with difficulty walking, decrease in fine motor skills, extremity
numbness and paresthesias, incontinence.

EXAM:
MRI HEAD WITHOUT AND WITH CONTRAST
MRI CERVICAL SPINE WITHOUT AND WITH CONTRAST
TECHNIQUE: Multiplanar, multiecho pulse sequences of the brain and surrounding
structures, and cervical spine, to include the craniocervical
junction and cervicothoracic junction, were obtained without and
with intravenous contrast.
CONTRAST:  10mL GADAVIST GADOBUTROL 1 MMOL/ML IV SOLN

[Series 11: T2 · sagittal · 3.0mm · 0.62mm/px · 4 of 13 slices shown (1 of 2)]
[im 1/13]
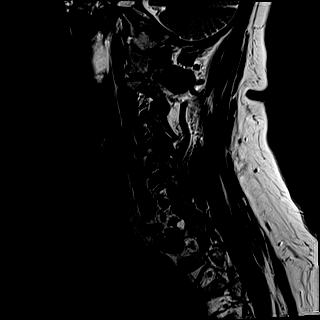
[im 5/13]
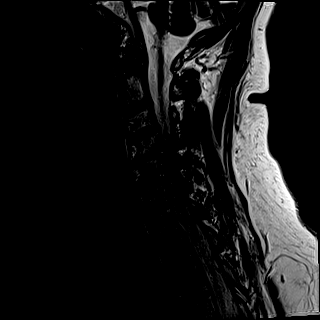
[im 9/13]
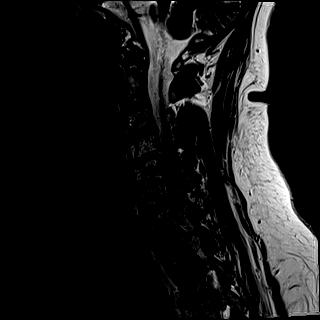
[im 13/13]
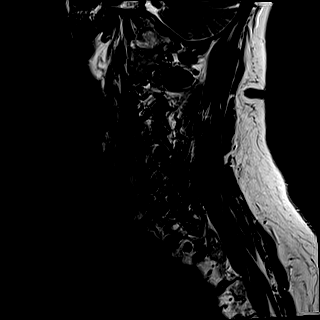

[Series 13: STIR · sagittal · 3.0mm · 0.62mm/px · 4 of 13 slices shown]
[im 1/13]
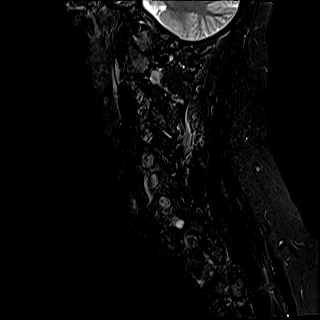
[im 5/13]
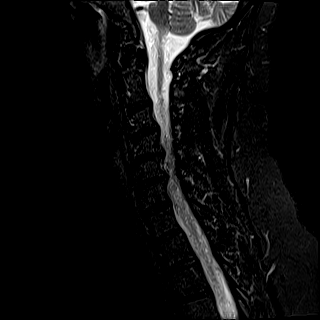
[im 9/13]
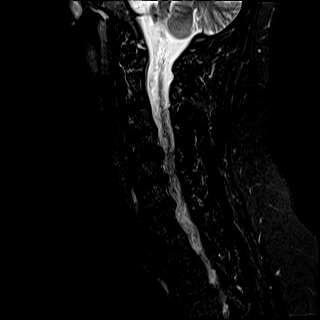
[im 13/13]
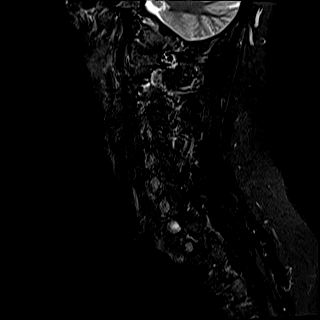

[Series 14: T2 · axial · 3.0mm · 0.70mm/px · z∈[-254,-159]mm · 8 of 29 slices shown (2 of 2)]
[im 1/29]
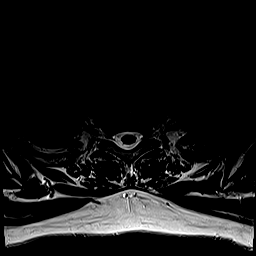
[im 5/29]
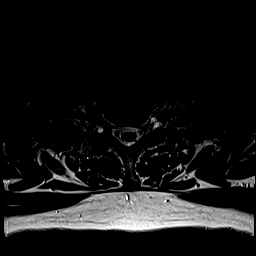
[im 9/29]
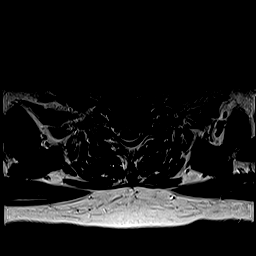
[im 13/29]
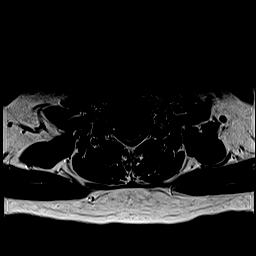
[im 17/29]
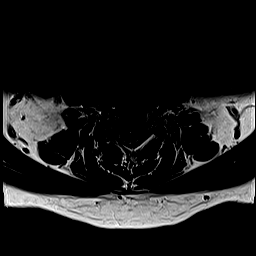
[im 21/29]
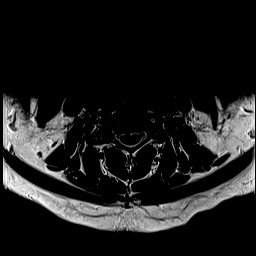
[im 25/29]
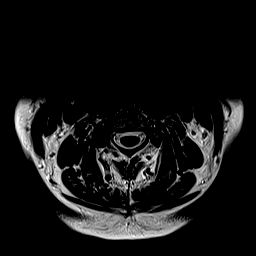
[im 29/29]
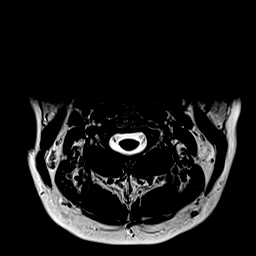

[Series 16: T1 · axial · non-contrast · 3.0mm · 0.35mm/px · z∈[-254,-159]mm · 8 of 29 slices shown]
[im 1/29]
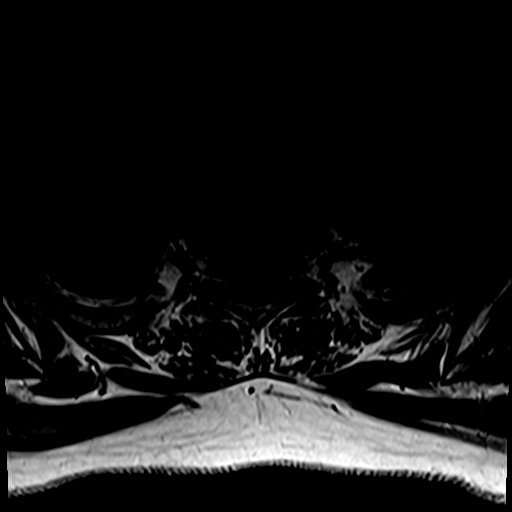
[im 5/29]
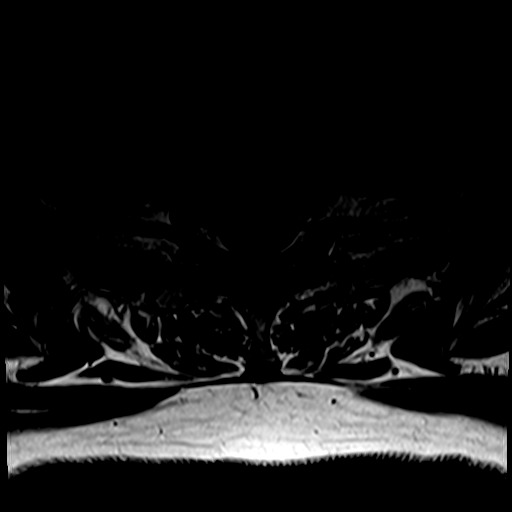
[im 9/29]
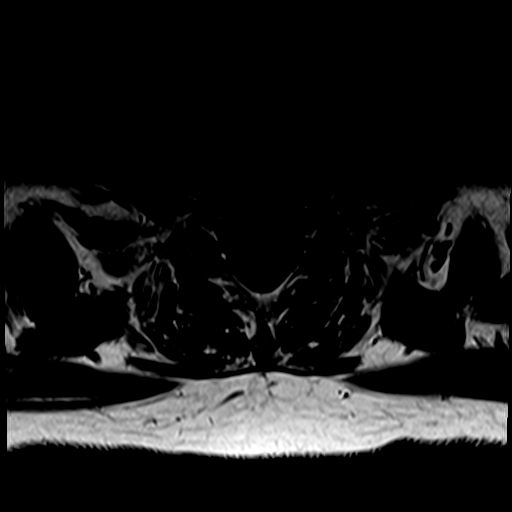
[im 13/29]
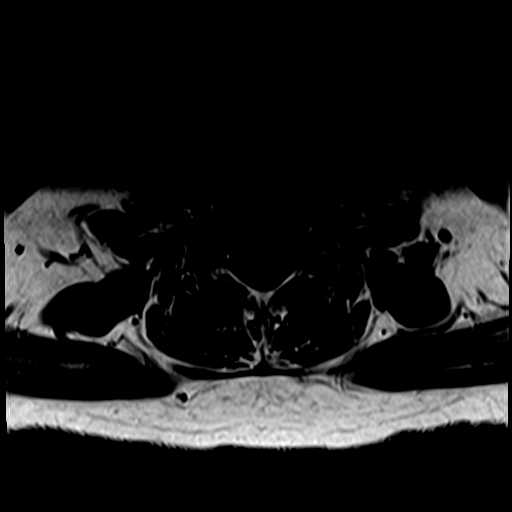
[im 17/29]
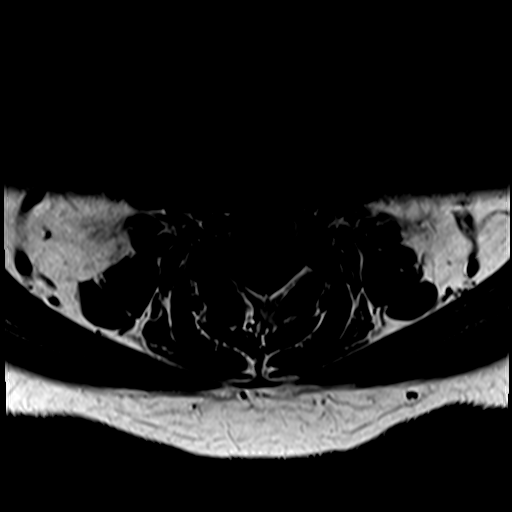
[im 21/29]
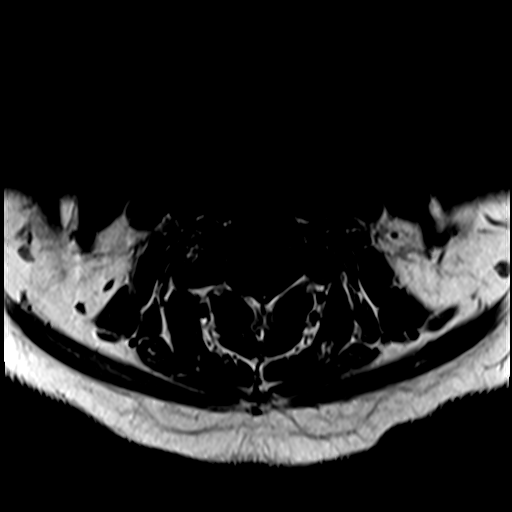
[im 25/29]
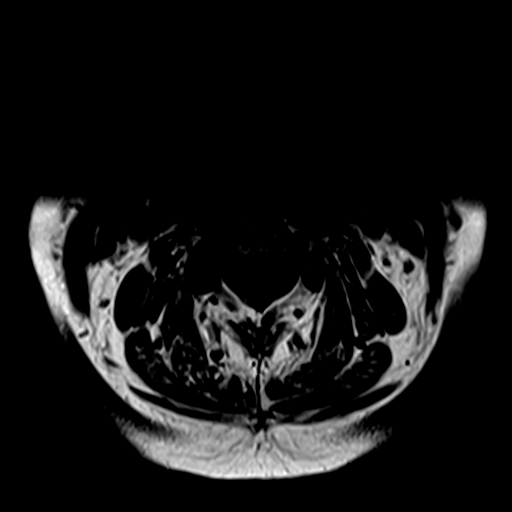
[im 29/29]
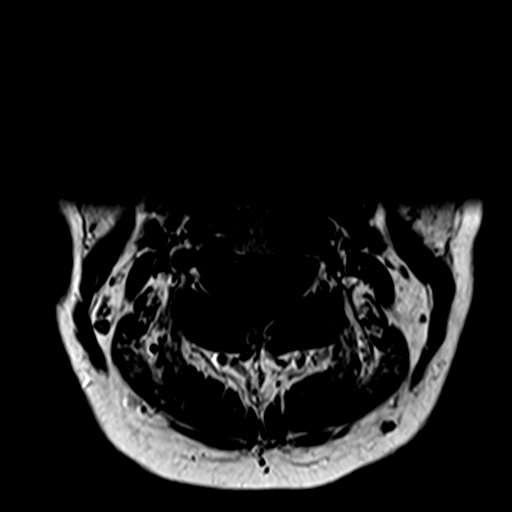

[Series 18: T1 post-contrast · axial · 3.0mm · 0.35mm/px · z∈[-254,-240]mm · 2 of 29 slices shown]
[im 1/29]
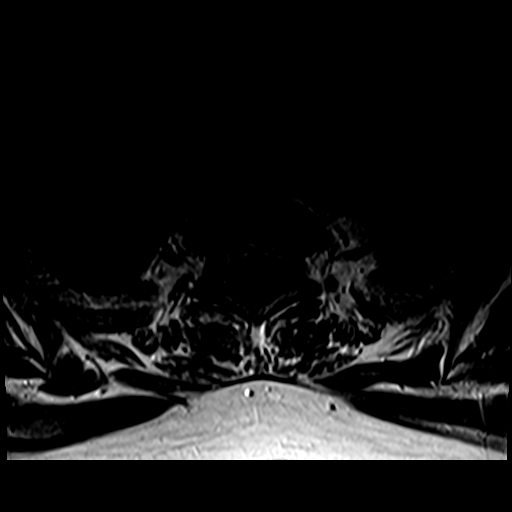
[im 5/29]
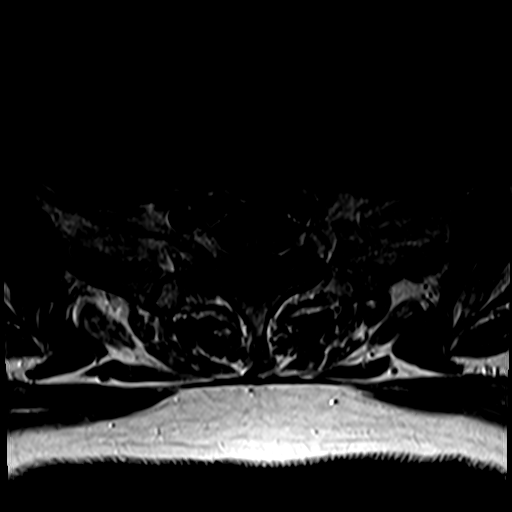

[26 of 48 positions shown; findings below may reference images not displayed]

FINDINGS: MRI HEAD FINDINGS

Brain: Cerebral volume within normal limits. Multiple scattered foci
of T2/FLAIR hyperintensity seen involving the periventricular, deep,
and juxta cortical white matter of both cerebral hemispheres,
slightly greater on the left. Few of these foci are oriented
perpendicular to the lateral ventricles. Associated callososeptal
involvement with scattered T1 black holes. No infratentorial foci
identified. Findings consistent with history of demyelinating
disease/multiple sclerosis. No associated restricted diffusion or
enhancement to suggest active demyelination.

No evidence for acute infarct. Gray-white matter differentiation
maintained. No encephalomalacia to suggest chronic infarction. No
foci of susceptibility artifact to suggest acute or chronic
intracranial hemorrhage.

No mass lesion, midline shift or mass effect. No hydrocephalus or
extra-axial fluid collection. Pituitary gland suprasellar region
normal. Midline structures intact and normal. No other abnormal
enhancement.

Vascular: Major intracranial vascular flow voids are well
maintained.

Skull and upper cervical spine: Craniocervical junction normal. Bone
marrow signal intensity within normal limits. No scalp soft tissue
abnormality.

Sinuses/Orbits: Globes and orbital soft tissues within normal
limits. Patient status post bilateral ocular lens replacement.
Paranasal sinuses are clear. No significant mastoid effusion.

Other: None.

MRI CERVICAL SPINE FINDINGS

Alignment: Straightening with slight reversal of the normal cervical
lordosis. Trace anterolisthesis of C3 on C4, C7 on T1, and T1 on T2,
chronic and facet mediated.

Vertebrae: Vertebral body height maintained without evidence for
acute or chronic fracture. Bone marrow signal intensity within
normal limits. No worrisome osseous lesions. No abnormal marrow
edema or enhancement.

Cord: Signal intensity within the cervical spinal cord is normal. No
cord signal changes to suggest demyelinating disease. No abnormal
enhancement.

Posterior Fossa, vertebral arteries, paraspinal tissues:
Craniocervical junction within normal limits. Paraspinous and
prevertebral soft tissues are normal. Normal intravascular flow
voids seen within the vertebral arteries bilaterally.

Disc levels:

C2-C3: Mild right-sided uncovertebral hypertrophy without
significant disc bulge. No stenosis.

C3-C4: Trace anterolisthesis. Diffuse disc bulge with bilateral
uncovertebral hypertrophy. Right-sided facet degeneration.
Flattening of the ventral thecal sac with resultant mild spinal
stenosis, greater on the right. Moderate right with mild left C4
foraminal stenosis.

C4-C5: Chronic diffuse degenerative disc osteophyte with
intervertebral disc space narrowing. Flattening and effacement of
the ventral thecal sac with resultant moderate spinal stenosis. Mild
cord flattening without cord signal changes. Severe left greater
than right C5 foraminal stenosis.

C5-C6: Chronic diffuse degenerative disc osteophyte with
intervertebral disc space narrowing. Flattening and effacement of
the ventral thecal sac with resultant moderate spinal stenosis.
Severe bilateral C6 foraminal stenosis.

C6-C7: Mild disc bulge. Superimposed left foraminal disc osteophyte
complex extends into the left neural foramen (series 14, image 21).
Resultant severe left C7 foraminal stenosis. Central canal remains
patent. No significant right foraminal narrowing.

C7-T1:  Negative interspace.  Mild facet hypertrophy.  No stenosis.

Visualized upper thoracic spine demonstrates no significant finding.
IMPRESSION: MRI HEAD IMPRESSION:

1. Scattered multifocal foci of T2/FLAIR hyperintensities involving
the left greater than right cerebral hemispheres, compatible with
history of multiple sclerosis. No evidence for active demyelination.
2. Otherwise normal brain MRI for age.

MRI CERVICAL SPINE IMPRESSION:

1. Normal MRI appearance of the cervical spinal cord. No evidence
for demyelinating disease.
2. Degenerative spondylosis at C4-5 and C5-6 with resultant moderate
spinal stenosis, with severe bilateral C5 and C6 foraminal
narrowing.
3. Left foraminal disc osteophyte complex at C6-7 with resultant
severe left C7 foraminal stenosis.
4. Right eccentric disc bulge with uncovertebral spurring at C3-4
with resultant moderate right C4 foraminal narrowing.

## 2020-02-23 MED ORDER — GADOBUTROL 1 MMOL/ML IV SOLN
10.0000 mL | Freq: Once | INTRAVENOUS | Status: AC | PRN
  Administered 2020-02-23: 10 mL via INTRAVENOUS

## 2020-02-23 MED ORDER — PANTOPRAZOLE SODIUM 40 MG PO TBEC: 40.0000 mg | DELAYED_RELEASE_TABLET | Freq: Every day | ORAL | 1 refills | Status: DC

## 2020-02-23 MED ORDER — REPATHA SURECLICK 140 MG/ML ~~LOC~~ SOAJ: 1.0000 | SUBCUTANEOUS | 11 refills | Status: DC

## 2020-02-23 NOTE — Telephone Encounter (Signed)
Patient notified of med approval via MyChart Rx sent to [facility]

## 2020-02-23 NOTE — Telephone Encounter (Signed)
PA for repatha sureclick submitted via CMM (Key: [id number])

## 2020-02-23 NOTE — Addendum Note (Signed)
Addended by: Fidel Levy on: 02/23/2020 02:37 PM   Modules accepted: Orders

## 2020-02-23 NOTE — Telephone Encounter (Signed)
PA submitted via CMM PA Case: [id number], Status: Approved, Coverage Starts on: 02/23/2020 12:00:00 AM, Coverage Ends on: 04/23/2020 12:00:00 AM

## 2020-02-27 DIAGNOSIS — M4802 Spinal stenosis, cervical region: Secondary | ICD-10-CM

## 2020-02-28 ENCOUNTER — Encounter: Payer: Self-pay | Admitting: Student in an Organized Health Care Education/Training Program

## 2020-02-28 ENCOUNTER — Encounter: Payer: Self-pay | Admitting: Family Medicine

## 2020-02-28 ENCOUNTER — Other Ambulatory Visit (HOSPITAL_COMMUNITY): Payer: Self-pay

## 2020-02-28 ENCOUNTER — Telehealth (HOSPITAL_COMMUNITY): Payer: Self-pay

## 2020-02-28 ENCOUNTER — Other Ambulatory Visit: Payer: Self-pay | Admitting: Primary Care

## 2020-02-28 DIAGNOSIS — J3089 Other allergic rhinitis: Secondary | ICD-10-CM

## 2020-02-28 MED ORDER — TEMAZEPAM 30 MG PO CAPS: ORAL_CAPSULE | ORAL | 4 refills | Status: DC

## 2020-02-28 NOTE — Telephone Encounter (Signed)
Spoke with pt on the phone regarding medication refill request. Pt states she has been sleeping "okay" on temazepam. Pt wishes MD to be aware of MRI results, stating she was diagnosed with MS with spinal stenosis. Pt also voiced wanting to see provider in person. Messages relayed to provider. Verbal order received to refill temazepam.

## 2020-02-29 ENCOUNTER — Encounter: Payer: Self-pay | Admitting: Dermatology

## 2020-03-05 ENCOUNTER — Telehealth (INDEPENDENT_AMBULATORY_CARE_PROVIDER_SITE_OTHER): Payer: PPO | Admitting: Psychiatry

## 2020-03-05 ENCOUNTER — Other Ambulatory Visit: Payer: Self-pay

## 2020-03-05 DIAGNOSIS — F3160 Bipolar disorder, current episode mixed, unspecified: Secondary | ICD-10-CM

## 2020-03-05 MED ORDER — DULOXETINE HCL 20 MG PO CPEP: 40.0000 mg | ORAL_CAPSULE | Freq: Every day | ORAL | 5 refills | Status: DC

## 2020-03-05 MED ORDER — HYDROXYZINE PAMOATE 50 MG PO CAPS: ORAL_CAPSULE | ORAL | 2 refills | Status: DC

## 2020-03-05 MED ORDER — LAMOTRIGINE 150 MG PO TABS: 300.0000 mg | ORAL_TABLET | Freq: Every day | ORAL | 5 refills | Status: DC

## 2020-03-05 NOTE — Progress Notes (Signed)
The most painful.  This  Psychiatric Initial Adult Assessment   Patient Identification: Emily Moon MRN:  [id number] Date of Evaluation:  03/05/2020 Referral Source: From the community Chief Complaint: Feels exhausted  Today the patient is doing better.  She has more energy she is getting out of bed and her mood is improved.  The desipramine seems to been helpful.  She does complain that her heart rate seems to be elevated.  At times it seems to be over 100.  Patient is not 100% sure specifically and we recommended that she stop the desipramine and wait 3 days and remeasure her heart rate.  If not reduced then it is not due to the desipramine as she should go back to taking it.  Overall she is sleeping fairly well.  She goes to bed very late she wakes up late.  Her appetite is relatively normal.  Her energy level is low.  The patient's pain is actually better.  She is using a soft tissue technique called neuro reset.  Unfortunately the patient has had an MRI that on 1 hand showed no active MS lesions.  But on the other hand it was unfortunate because it showed moderate to severe spinal stenosis.  She has an appointment with the surgeon in the next week.  The patient continues to take Cymbalta.  She does take [place].  At this time the patient is not in therapy.  She does have some good supports.  She has had a contact with an old female friend from high school.  The patient however says she is very sedentary.  She had both of her vaccines.  Patient is happy about that.  But she does little else.  She is a bit less talkative.  The patient has a lot of medical concerns.  Patient is not suicidal she drinks no alcohol uses no drugs.  She has no psychotic symptomatology.    Patient: Emily Moon  Procedure(s) Performed: * No surgery found *  Anesthes  Patient location:   Post pain:   Post assessment:   Last Vitals:  There were no vitals filed for this visit.  Post vital signs:   Level  of consciousness:   Complications: . Associated Signs/Symptoms: Depression Symptoms:  hopelessness, (Hypo) Manic Symptoms:   Anxiety Symptoms:   Psychotic Symptoms:   PTSD Symptoms:   Past Psychiatric History: The patient was seen in a psychiatric office and takes multiple medications she's not been recently hospitalized.  Previous Psychotropic Medications: Yes   Substance Abuse History in the last 12 months:  No.  Consequences of Substance Abuse: Negative  Past Medical History:  Past Medical History:  Diagnosis Date  . Arthritis    osteo  . Asthma   . Bipolar disorder ([place]) 05/21/14  . Cataracts, bilateral   . DDD (degenerative disc disease), cervical    also back  . Depression   . Dizziness    Positional  . Edema    feet/legs  . Fibromyalgia syndrome   . Fungal infection    Finger nails  . GERD (gastroesophageal reflux disease)   . Gout   . Headache    seasonal allergies  . Heart palpitations   . 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 Hypercholesterolemia   . Multiple sclerosis (H[facility])    weakness  . Nephrolithiasis    kidney stones  . Osteoporosis    osteoarthritis  . Pneumonia   . PONV (postoperative nausea and vomiting)    no problem  after cataract surgery  . Psoriasis   . Renal stone   . Shortness of breath dyspnea    wheezing  . Sleep apnea 2012   sleep study / slight, no interventions  . Urinary frequency     Past Surgical History:  Procedure Laterality Date  . CATARACT EXTRACTION W/PHACO Left 05/21/2015   Procedure: CATARACT EXTRACTION PHACO AND INTRAOCULAR LENS PLACEMENT (IOC);  Surgeon: Birder Robson, MD;  Location: [facility];  Service: Ophthalmology;  Laterality: Left;  [place] 00:35 AP% 22.9 CDE 8.11 fluid pack lot [id number]  . CATARACT EXTRACTION W/PHACO Right 06/04/2015   Procedure: CATARACT EXTRACTION PHACO AND INTRAOCULAR LENS PLACEMENT (IOC);  Surgeon: Birder Robson, MD;  Location: [facility];  Service: Ophthalmology;  Laterality:  Right;  US:00:48 AP%: 10.5 CDE:5.08 Fluid lot [id number]  . CYSTOSCOPY/URETEROSCOPY/HOLMIUM LASER/STENT PLACEMENT Bilateral 09/22/2016   Procedure: CYSTOSCOPY/URETEROSCOPY/HOLMIUM LASER/STENT PLACEMENT;  Surgeon: Hollice Espy, MD;  Location: [facility];  Service: Urology;  Laterality: Bilateral;  . EYE SURGERY  2015   tissue biopsy  . FOOT SURGERY  2015  . JOINT REPLACEMENT Left 2013   hip replacement  . LITHOTRIPSY    . PTOSIS REPAIR Bilateral 02/18/2016   Procedure: BILATERAL PTOSIS REPAIR UPPER EYELIDS;  Surgeon: Karle Starch, MD;  Location: [place];  Service: Ophthalmology;  Laterality: Bilateral;  LEAVE PT EARLY AM  . thumb surgery Right   . TONSILLECTOMY  1973    Family Psychiatric History:   Family History:  Family History  Problem Relation Age of Onset  . Cancer Father        Abdomen with mastasis  . Cancer Mother   . Heart disease Mother   . Kidney disease Neg Hx   . Bladder Cancer Neg Hx   . Prostate cancer Neg Hx   . Kidney cancer Neg Hx     Social History:   Social History   Socioeconomic History  . Marital status: Divorced    Spouse name: Not on file  . Number of children: 1  . Years of education: Not on file  . Highest education level: Not on file  Occupational History  . 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 Smoking status: Former Smoker    Years: 25.00    Types: Cigarettes    Quit date: 11/16/2012    Years since quitting: 7.3  . Smokeless tobacco: Never Used  . Tobacco comment: occasional use  Substance and Sexual Activity  . Alcohol use: No    Alcohol/week: 0.0 standard drinks  . Drug use: No    Types: Methylphenidate  . Sexual activity: Never  Other Topics Concern  . Not on file  Social History Narrative  . Not on file   Social Determinants of Health   Financial Resource Strain:   . Difficulty of Paying Living Expenses:   Food Insecurity:   . Worried About Charity fundraiser in  the Last Year:   . Arboriculturist in the Last Year:   Transportation Needs:   . Film/video editor (Medical):   Marland Kitchen Lack of Transportation (Non-Medical):   Physical Activity:   . Days of Exercise per Week:   . Minutes of Exercise per Session:   Stress:   . Feeling of Stress :   Social Connections:   . Frequency of Communication with Friends and Family:   . Frequency of Social Gatherings with Friends and Family:   . Attends Religious Services:   .  Active Member of Clubs or Organizations:   . Attends Archivist Meetings:   Marland Kitchen Marital Status:     Additional Social History:   Allergies:   Allergies  Allergen Reactions  . Albuterol Shortness Of Breath and Other (See Comments)    Makes pt feel jittery/ tacycardic  . Crestor [Rosuvastatin] Other (See Comments)    Joint pain, muscle pain, and hair loss  . Halcion [Triazolam] Other (See Comments)    Dizziness,headaches,bladder problems  . Levaquin [Levofloxacin In D5w] Diarrhea and Itching    Shoulder pain  . Naproxen Sodium Swelling    Patient tolerates in small doses  . Tylenol [Acetaminophen] Swelling    Patient tolerates in small doses  . Cefaclor Other (See Comments)    Doesn't remember---unsure if actually allergic   . Diclofenac Sodium Other (See Comments)    "made very sick"  . Sulfa Antibiotics Itching    Unsure of reaction possibly itching  . Tramadol Itching and Nausea And Vomiting  . Aripiprazole Other (See Comments)    Muscle tension/cramping  . Ibuprofen Swelling    Patient tolerates in small doses    Metabolic Disorder Labs: Lab Results  Component Value Date   HGBA1C 6.4 01/22/2020   No results found for: PROLACTIN Lab Results  Component Value Date   CHOL 367 (H) 01/22/2020   TRIG 298.0 (H) 01/22/2020   HDL 52.60 01/22/2020   CHOLHDL 7 01/22/2020   VLDL 59.6 (H) 01/22/2020   LDLCALC 223 (H) 11/13/2014     Current Medications: Current Outpatient Medications  Medication Sig Dispense  Refill  . ASPERCREME LIDOCAINE EX Apply 1 application topically 4 (four) times daily as needed (for pain.).     Marland Kitchen aspirin 325 MG tablet Take 325 mg by mouth every 4 (four) hours as needed for headache.     . Azelastine HCl 137 MCG/SPRAY SOLN PLACE 2 SPRAYS INTO BOTH NOSTRILS 2 (TWO) TIMES DAILY 30 mL 1  . BIOTIN PO Take 200 mg by mouth daily.     . cetirizine (ZYRTEC) 10 MG tablet Take 10 mg by mouth 2 (two) times daily.    . Cholecalciferol (VITAMIN D3) 10000 units capsule Take 30,000 Units by mouth daily.     . ciclopirox (LOPROX) 0.77 % cream Apply topically 2 (two) times daily. 30 g 0  . clobetasol (TEMOVATE) 0.05 % external solution Mix clobetasol solution with  CeraVe cream Use twice daily to affected areas.Avoid Face, groin and underarm 50 mL 0  . cyanocobalamin (,VITAMIN B-12,) 1000 MCG/ML injection Inject 1 mL (1,000 mcg total) into the muscle every 3 (three) months. 1 mL 0  . desipramine (NORPRAMIN) 25 MG tablet TAKE 1 TABLET BY MOUTH IN THE MORNING 90 tablet 1  . diclofenac sodium (VOLTAREN) 1 % GEL Apply 4 g topically 4 (four) times daily. 500 g 5  . docusate sodium (COLACE) 100 MG capsule Take 1 capsule (100 mg total) by mouth 2 (two) times daily. 60 capsule 0  . doxycycline (PERIOSTAT) 20 MG tablet Take 20 mg by mouth 2 (two) times daily.    . DULoxetine (CYMBALTA) 20 MG capsule Take 2 capsules (40 mg total) by mouth daily. 60 capsule 5  . Evolocumab (REPATHA SURECLICK) [id number] MG/ML SOAJ Inject 1 Dose into the skin every 14 (fourteen) days. 2 pen 11  . famotidine (PEPCID) 20 MG tablet Take 1 tablet (20 mg total) by mouth at bedtime. 30 tablet 2  . fluocinonide (LIDEX) 0.05 % external solution Apply 1 application  topically 2 (two) times daily as needed. Apply to scalp twice daily as need for itch 180 mL 3  . hydrocortisone 2.5 % cream Apply BID to affected area in underarms and groin prn flares    . hydroquinone 4 % cream 1 APPLICATION TOPICALLY DAILY AS NEEDED FOR BLEMISHES.  3  .  hydrOXYzine (VISTARIL) 50 MG capsule TAKE 1 CAPSLE BY MOUTH AT NOON, 1 AT 6 PM, AND 1 AS NEEDED 270 capsule 2  . ketorolac (TORADOL) 10 MG tablet Take 1 tablet (10 mg total) by mouth every 8 (eight) hours as needed. 30 tablet 1  . lamoTRIgine (LAMICTAL) 150 MG tablet Take 2 tablets (300 mg total) by mouth at bedtime. 60 tablet 5  . levalbuterol (XOPENEX HFA) 45 MCG/ACT inhaler Inhale 1-2 puffs into the lungs every 6 (six) hours as needed for wheezing. 1 Inhaler 0  . levalbuterol (XOPENEX) 1.25 MG/3ML nebulizer solution USE 1 VIAL VIA NEBULIZER EVERY 4 HOURS AS NEEDED FOR WHEEZING OR SHORTNESS OF BREATH 270 mL 1  . Lysine 1000 MG TABS Take 2,000-4,000 mg by mouth at bedtime. 2000 mg scheduled at bedtime and patient will take 4000 mg if she has outbreak    . Magnesium 500 MG TABS Take 1 tablet by mouth at bedtime.    Marland Kitchen neomycin-bacitracin-polymyxin (NEOSPORIN) [phone] ointment Apply 1 application topically 4 (four) times daily as needed (for cut/scrapes.).    Marland Kitchen pantoprazole (PROTONIX) 40 MG tablet Take 1 tablet (40 mg total) by mouth daily. 90 tablet 1  . phenazopyridine (AZO-TABS) 95 MG tablet Take 95 mg by mouth 3 (three) times daily as needed for pain.    . polyethylene glycol powder (GLYCOLAX/MIRALAX) powder MIX 17 GRAMS (1 CAPFUL) WITH 4-8 OZ OF LIQUID AND TAKE BY MOUTH TWICE DAILY AS NEEDED 527 g 0  . temazepam (RESTORIL) 30 MG capsule TAKE ONE TO TWO CAPSULES BY MOUTH NIGHTLY AT BEDTIME 60 capsule 4  . tiZANidine (ZANAFLEX) 4 MG tablet TAKE 2 TABLETS (8 MG TOTAL) BY MOUTH EVERY 6 (SIX) HOURS AS NEEDED FOR MUSCLE SPASMS. 240 tablet 0  . traZODone (DESYREL) 50 MG tablet Take 2 tablets (100 mg total) by mouth at bedtime. 60 tablet 7   No current facility-administered medications for this visit.    Neurologic: Headache: No Seizure: No Paresthesias:No  Musculoskeletal: Strength & Muscle Tone: within normal limits Gait & Station: normal Patient leans: N/A  Psychiatric Specialty  Exam: ROS  Last menstrual period 08/23/2014.There is no height or weight on file to calculate BMI.  General Appearance: Casual  Eye Contact:  Good  Speech:  Clear and Coherent  Volume:  Normal  Mood:  Negative  Affect:  Appropriate  Thought Process:  Goal Directed  Orientation:  NA  Thought Content:  Logical  Suicidal Thoughts:  No  Homicidal Thoughts:  No  Memory:  Negative  Judgement:  Good  Insight:  Good  Psychomotor Activity:  Normal  Concentration:    Recall:    Fund of Knowledge:Good  Language: Good  Akathisia:  No  Handed:  Right  AIMS (if indicated):    Assets:  Desire for Improvement  ADL's:  Intact  Cognition: WNL  Sleep:      4/20/20214:39 PM   This patient has had a number of major psychiatric conditions.  She has major depression and takes Cymbalta and desipramine.  Her second problem is that of insomnia.  The patient takes [place].  The patient also has issues with irritability and takes Lamictal on a  regular basis.  Overall her mood is relatively stable.  He is functioning only fairly well.  She is limited a lot by her multiple sclerosis which she is not taking any medicines for it and actually based upon MRI findings is not active.  The patient is very engaging.  She is strong opinions about things.  I think it is related to characterological features of her.  This patient to return to see me in 3 months.

## 2020-03-06 ENCOUNTER — Other Ambulatory Visit: Payer: Self-pay

## 2020-03-06 ENCOUNTER — Encounter: Payer: PPO | Attending: Primary Care | Admitting: Dietician

## 2020-03-06 ENCOUNTER — Encounter: Payer: Self-pay | Admitting: Dietician

## 2020-03-06 VITALS — Ht 69.0 in | Wt 233.9 lb

## 2020-03-06 DIAGNOSIS — G35 Multiple sclerosis: Secondary | ICD-10-CM | POA: Diagnosis not present

## 2020-03-06 DIAGNOSIS — E785 Hyperlipidemia, unspecified: Secondary | ICD-10-CM

## 2020-03-06 DIAGNOSIS — R7303 Prediabetes: Secondary | ICD-10-CM | POA: Diagnosis not present

## 2020-03-06 DIAGNOSIS — Z713 Dietary counseling and surveillance: Secondary | ICD-10-CM | POA: Insufficient documentation

## 2020-03-06 NOTE — Progress Notes (Signed)
Patients colonoscopy has been rescheduled from 03/22/20 per patients request.  Her new colonoscopy date is Wed 04/24/20.  Pt has been advised of her COVID Test date 04/22/20.  New Instructions sent via mychart and mailed.  Thanks,  [place], [place]

## 2020-03-06 NOTE — Patient Instructions (Addendum)
   Start going to the pool and swimming for 30-40 minutes or try walking  Try to eat three meals a day, even if they are small meals   Make sure you have a protein source at every meal/snack

## 2020-03-06 NOTE — Progress Notes (Signed)
Medical Nutrition Therapy: Visit start time: 1330  end time: 1430  Assessment:  Diagnosis: pre-diabetes, hyperlipidemia Past medical history: high cholesterol, diverticulosis, sleep apnea, MS, spinal stenosis, arthritis Psychosocial issues/ stress concerns: high stress, taking medication and seeing therapist  Preferred learning method:  . Auditory . Visual . Hands-on  Current weight: 233.9lbs  Height: 5'9" Medications, supplements: reconciled in medical record   Progress and evaluation:  Pt states that about a year ago she was 180 lbs and has gained 40-50 lbs in less than a year.  Pt attributes her weight gain to depression, history of preparing and eating food while asleep, poor sleep habits, and eating the 'wrong foods'.  Pt reports her last A1c was 6.4. Pt states she does not want to develop diabetes and would like help preparing meals that do not require cooking and how to dine out.   Physical activity: none   Dietary Intake:  Usual eating pattern includes 1-2 meals and 1 snack per day. Dining out frequency: 2-3 meals per week.  Breakfast: nothing Snack: none   Lunch (4 or 5p): sometimes, grilled chicken sandwich with unsalted fries+ ice cream and coffee drink  Snack: none  Supper: nothing Snack (7 or 8p): [employer] glucose control shake/ fat free yogurt, low fat provolone/ granola bar with 1% milk + apples, bananas, strawberries, or blueberries  Beverages: water  Nutrition Care Education:  Basic nutrition: basic food groups, appropriate nutrient balance, appropriate meal and snack schedule, general nutrition guidelines    Weight control: identifying healthy weight, determining reasonable weight loss rate, importance of low sugar and low fat choices, portion control strategies  Advanced nutrition:  cooking techniques, dining out Pre-diabetes:  goals for BGs, appropriate meal and snack schedule, appropriate carb intake and balance, healthy carb choices, role of fiber,  protein, fat Other lifestyle changes:  benefits of making changes, benefits of exercise  Nutritional Diagnosis:  [place]-2.2 Altered nutrition-related laboratory As related to pre-diabetes.  As evidenced by pt reported A1c of 6.4. [place]-3.3 Overweight/obesity As related to excess calorie intake and inadequate physical activity.  As evidenced by pt current BMI of 34.54.  Intervention:  Pt had many questions about different diets, 'good' v 'bad' foods, and portions.  Pt seems to be dealing with a little information overload.  Discussed simple steps that may help to delay onset of diabetes like carbohydrate counting and eating at regular intervals. Additional instruction and discussion as listed above.   Education Materials given:  . General diet guidelines for Diabetes . Quick and Balanced Meals . Plate Planner with Food Lists . Planning A Balanced Skillet Meal . Goals/ instructions   Learner/ who was taught:  . Patient   Level of understanding: . Partial understanding; needs review/ practice  Demonstrated degree of understanding via:   Teach back Learning barriers: . None  Willingness to learn/ readiness for change: . Acceptance, ready for change  Monitoring and Evaluation:  Dietary intake, exercise, and body weight      follow up: prn

## 2020-03-07 ENCOUNTER — Ambulatory Visit (HOSPITAL_COMMUNITY): Payer: PPO | Admitting: Psychiatry

## 2020-03-07 DIAGNOSIS — R7303 Prediabetes: Secondary | ICD-10-CM

## 2020-03-08 MED ORDER — FREESTYLE LIBRE SENSOR SYSTEM MISC: 2 refills | Status: DC

## 2020-03-08 MED ORDER — FREESTYLE LIBRE READER DEVI: 0 refills | Status: DC

## 2020-03-08 NOTE — Telephone Encounter (Signed)
Since she is not Dx with diabetes, I do not believe medicare will cover.

## 2020-03-08 NOTE — Telephone Encounter (Signed)
Send as instructed.

## 2020-03-08 NOTE — Telephone Encounter (Signed)
Chan,please send in with Dx of prediabetes. She will let us know if it is not covered.

## 2020-03-11 ENCOUNTER — Ambulatory Visit (INDEPENDENT_AMBULATORY_CARE_PROVIDER_SITE_OTHER)
Admission: RE | Admit: 2020-03-11 | Discharge: 2020-03-11 | Disposition: A | Discharge status: Home / Self Care | Payer: Self-pay | Source: Ambulatory Visit | Attending: Internal Medicine | Admitting: Internal Medicine

## 2020-03-11 ENCOUNTER — Other Ambulatory Visit: Payer: Self-pay

## 2020-03-11 DIAGNOSIS — E7849 Other hyperlipidemia: Secondary | ICD-10-CM

## 2020-03-11 IMAGING — CT CT CARDIAC CORONARY ARTERY CALCIUM SCORE
3 series · 14 of 20 positions shown, 15 images · non-contrast
Comparison: None.
COMPARISON: None.

Addendum:
EXAM:
OVER-READ INTERPRETATION  CT CHEST

The following report is an over-read performed by radiologist Dr.
OBISPO [REDACTED] on [DATE]. This
over-read does not include interpretation of cardiac or coronary
anatomy or pathology. The coronary calcium score interpretation by
the cardiologist is attached.
CLINICAL DATA: Risk stratification
Coronary Calcium Score
TECHNIQUE: The patient was scanned on a Siemens Somatom 64 slice scanner. Axial
non-contrast 3 mm slices were carried out through the heart. The
data set was analyzed on a dedicated work station and scored using
the Agatson method.

[Series 2: casc 3.0 bv41 2 bestsyst 41 % · axial · 0.35mm/px · z∈[+1276,+1351]mm · 4 of 43 slices shown, 5 images]
[im 9/43  vessel]
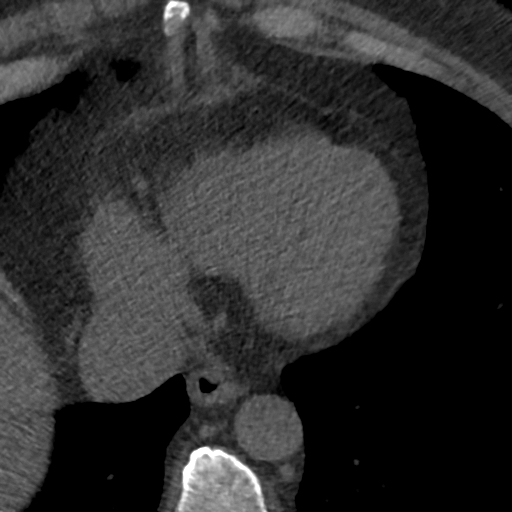
[im 9/43  lung]
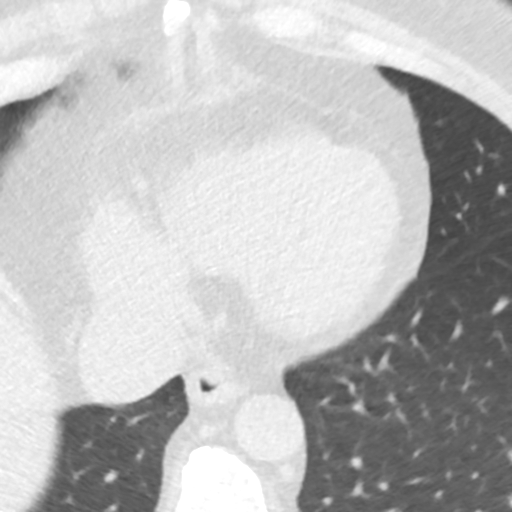
[im 17/43  vessel]
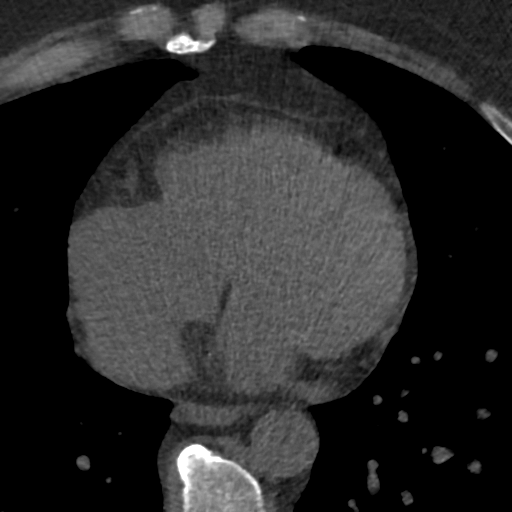
[im 26/43  vessel]
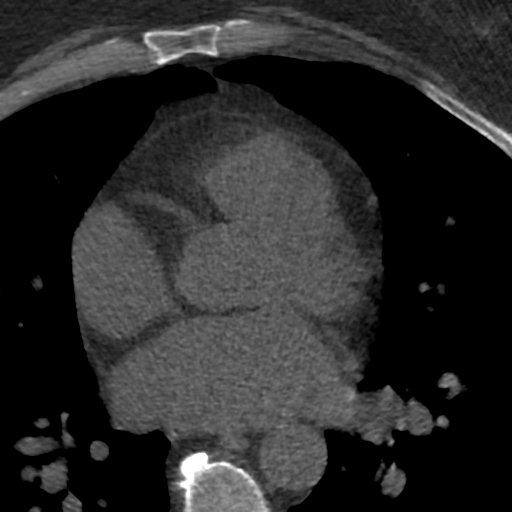
[im 34/43  vessel]
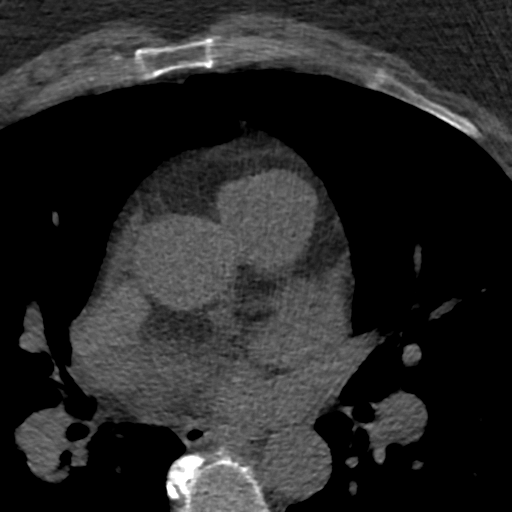

[Series 3: lung 43 % · axial · 0.68mm/px · z∈[+1273,+1357]mm · 5 of 43 slices shown]
[im 8/43  lung]
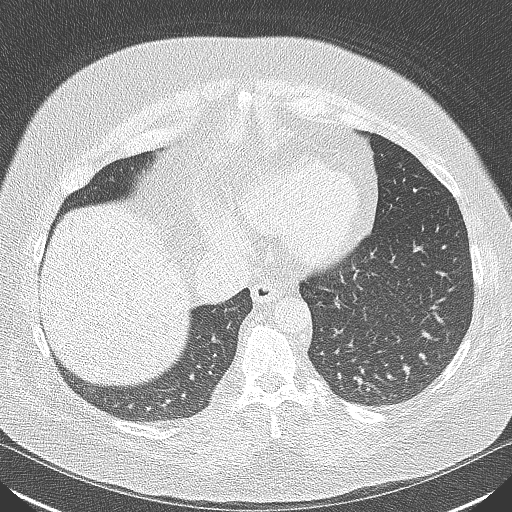
[im 15/43  lung]
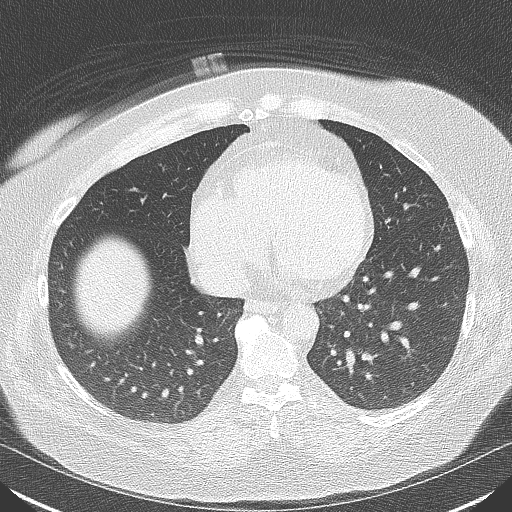
[im 22/43  lung]
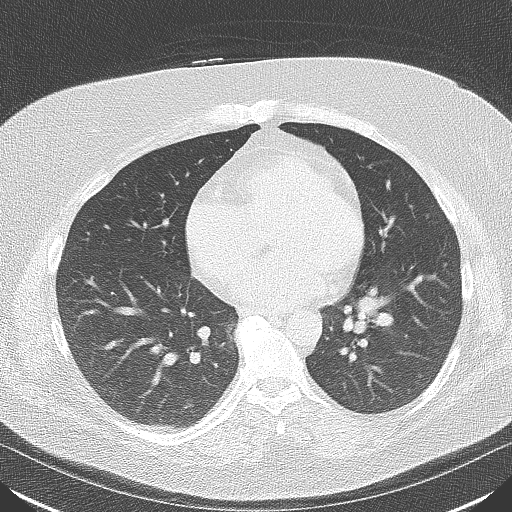
[im 29/43  lung]
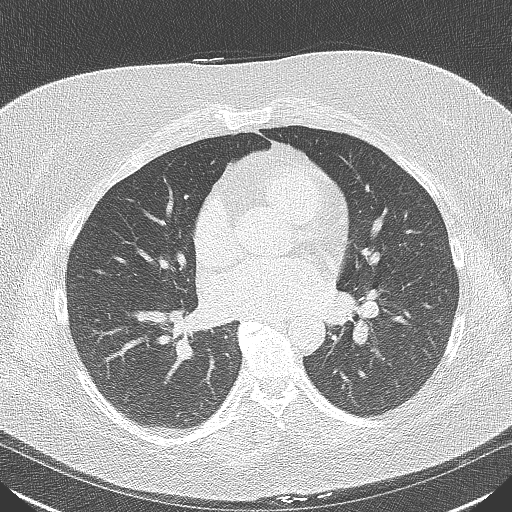
[im 36/43  lung]
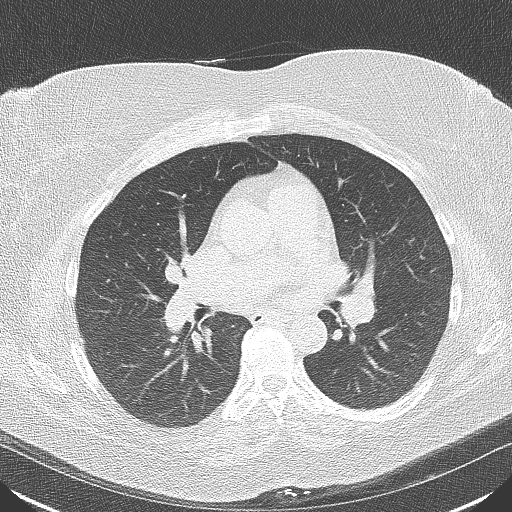

[Series 4: lung st 43 % · axial · 0.68mm/px · z∈[+1273,+1357]mm · 5 of 43 slices shown]
[im 8/43  lung]
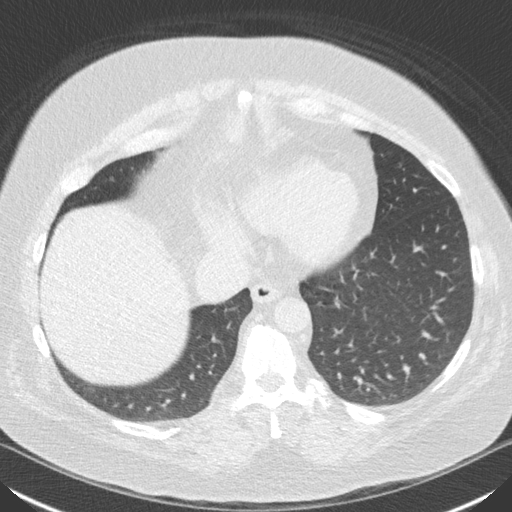
[im 15/43  lung]
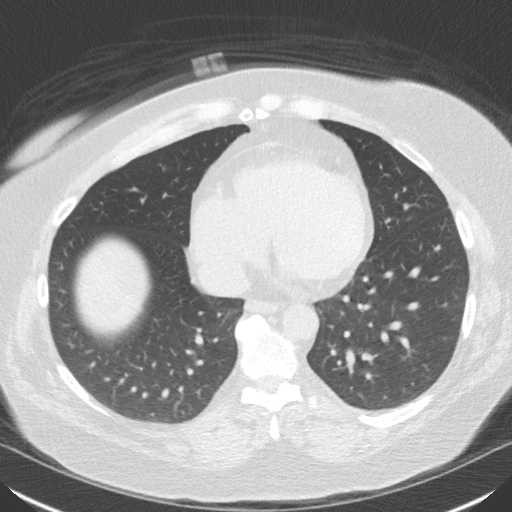
[im 22/43  lung]
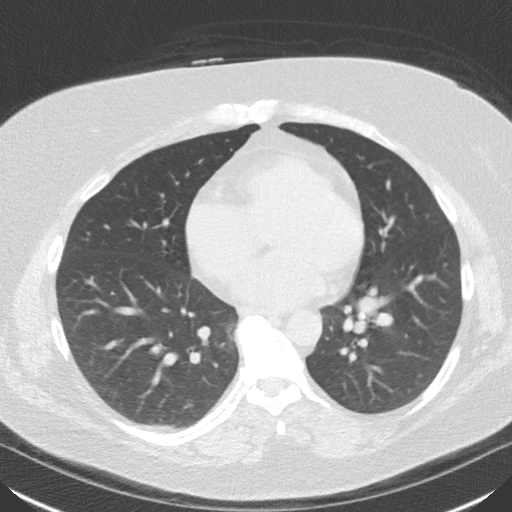
[im 29/43  lung]
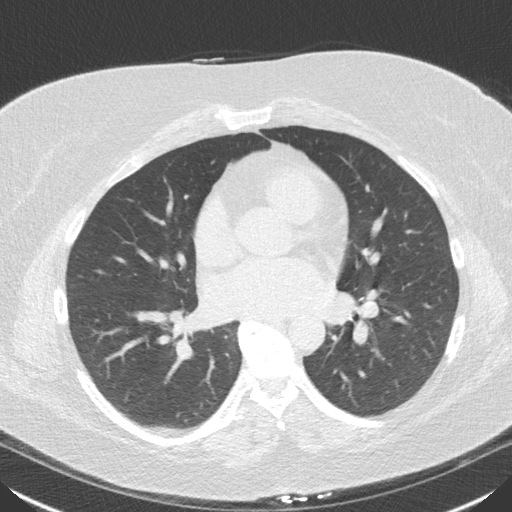
[im 36/43  lung]
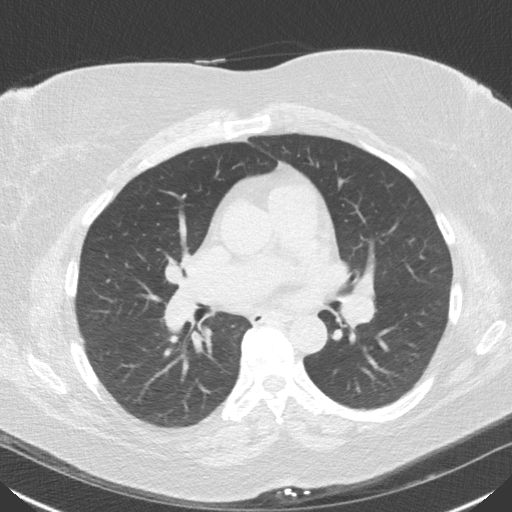

[14 of 20 positions shown; findings below may reference images not displayed]

FINDINGS: Vascular: No significant incidental findings.

Mediastinum/Nodes: Visualized mediastinum and hilar regions
demonstrate no lymphadenopathy or masses.

Lungs/Pleura: Visualized lungs show no evidence of pulmonary edema,
consolidation, pneumothorax, nodule or pleural fluid.

Upper Abdomen: Visualized upper abdomen is unremarkable.

Musculoskeletal: Visualized bony structures are unremarkable.
IMPRESSION: No significant incidental findings.
FINDINGS: Non-cardiac: See separate report from [REDACTED].

Ascending aorta: Normal diameter 3.3 cm

Pericardium: Normal

Coronary arteries: No calcium noted
IMPRESSION: Coronary calcium score of 0.

OBISPO

*** End of Addendum ***
EXAM:
OVER-READ INTERPRETATION  CT CHEST

The following report is an over-read performed by radiologist Dr.
OBISPO [REDACTED] on [DATE]. This
over-read does not include interpretation of cardiac or coronary
anatomy or pathology. The coronary calcium score interpretation by
the cardiologist is attached.
FINDINGS: Vascular: No significant incidental findings.

Mediastinum/Nodes: Visualized mediastinum and hilar regions
demonstrate no lymphadenopathy or masses.

Lungs/Pleura: Visualized lungs show no evidence of pulmonary edema,
consolidation, pneumothorax, nodule or pleural fluid.

Upper Abdomen: Visualized upper abdomen is unremarkable.

Musculoskeletal: Visualized bony structures are unremarkable.
IMPRESSION: No significant incidental findings.

## 2020-03-14 DIAGNOSIS — M542 Cervicalgia: Secondary | ICD-10-CM | POA: Diagnosis not present

## 2020-03-14 DIAGNOSIS — G8929 Other chronic pain: Secondary | ICD-10-CM | POA: Diagnosis not present

## 2020-03-14 DIAGNOSIS — M545 Low back pain: Secondary | ICD-10-CM | POA: Diagnosis not present

## 2020-03-15 ENCOUNTER — Other Ambulatory Visit: Payer: Self-pay | Admitting: Neurosurgery

## 2020-03-15 DIAGNOSIS — G8929 Other chronic pain: Secondary | ICD-10-CM

## 2020-03-15 DIAGNOSIS — M545 Low back pain, unspecified: Secondary | ICD-10-CM

## 2020-03-19 ENCOUNTER — Ambulatory Visit (INDEPENDENT_AMBULATORY_CARE_PROVIDER_SITE_OTHER): Payer: PPO | Admitting: *Deleted

## 2020-03-19 DIAGNOSIS — E538 Deficiency of other specified B group vitamins: Secondary | ICD-10-CM | POA: Diagnosis not present

## 2020-03-19 MED ORDER — CYANOCOBALAMIN 1000 MCG/ML IJ SOLN
1000.0000 ug | Freq: Once | INTRAMUSCULAR | Status: AC
  Administered 2020-03-19: 1000 ug via INTRAMUSCULAR

## 2020-03-19 NOTE — Progress Notes (Signed)
Per orders of [name] Clark, NP, injection of Vit B12 given by Filimon Miranda M. Patient tolerated injection well.  

## 2020-03-22 ENCOUNTER — Other Ambulatory Visit: Payer: Self-pay | Admitting: Physical Medicine & Rehabilitation

## 2020-03-22 DIAGNOSIS — M5442 Lumbago with sciatica, left side: Secondary | ICD-10-CM | POA: Diagnosis not present

## 2020-03-22 DIAGNOSIS — G8929 Other chronic pain: Secondary | ICD-10-CM | POA: Diagnosis not present

## 2020-03-22 DIAGNOSIS — M5441 Lumbago with sciatica, right side: Secondary | ICD-10-CM | POA: Diagnosis not present

## 2020-03-22 DIAGNOSIS — M5412 Radiculopathy, cervical region: Secondary | ICD-10-CM

## 2020-03-22 DIAGNOSIS — M4802 Spinal stenosis, cervical region: Secondary | ICD-10-CM | POA: Diagnosis not present

## 2020-03-22 DIAGNOSIS — M542 Cervicalgia: Secondary | ICD-10-CM | POA: Diagnosis not present

## 2020-03-27 ENCOUNTER — Ambulatory Visit: Payer: Self-pay | Admitting: Dermatology

## 2020-03-29 ENCOUNTER — Ambulatory Visit
Admission: RE | Admit: 2020-03-29 | Discharge: 2020-03-29 | Disposition: A | Discharge status: Home / Self Care | Payer: PPO | Source: Ambulatory Visit | Attending: Neurosurgery | Admitting: Neurosurgery

## 2020-03-29 ENCOUNTER — Other Ambulatory Visit: Payer: Self-pay

## 2020-03-29 DIAGNOSIS — G8929 Other chronic pain: Secondary | ICD-10-CM

## 2020-03-29 DIAGNOSIS — M545 Low back pain, unspecified: Secondary | ICD-10-CM

## 2020-03-29 IMAGING — MR MR LUMBAR SPINE W/O CM
5 series · 31 of 48 positions shown · non-contrast
Comparison: None.

CLINICAL DATA: Chronic low back pain.

EXAM:
MRI LUMBAR SPINE WITHOUT CONTRAST
TECHNIQUE: Multiplanar, multisequence MR imaging of the lumbar spine was
performed. No intravenous contrast was administered.

[Series 5: T2 · sagittal · 4.0mm · 0.81mm/px · 6 of 17 slices shown (1 of 2)]
[im 1/17]
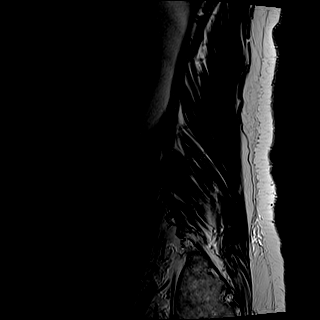
[im 4/17]
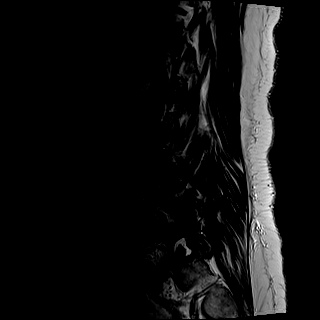
[im 7/17]
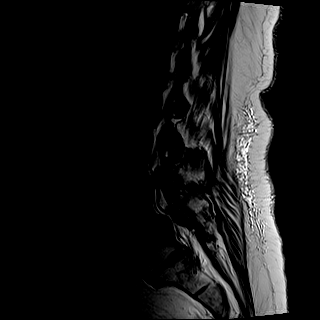
[im 10/17]
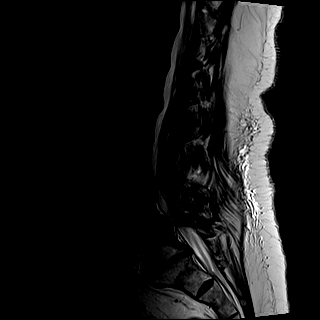
[im 13/17]
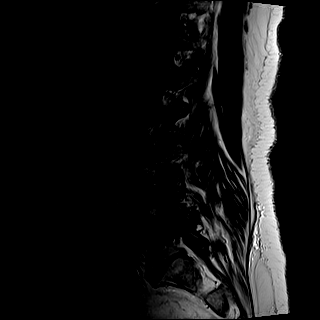
[im 17/17]
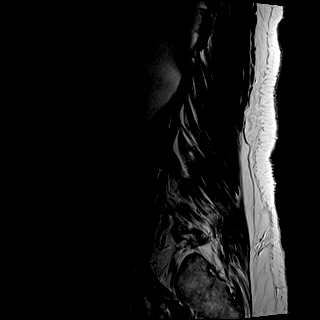

[Series 6: T1 · sagittal · 4.0mm · 0.81mm/px · 7 of 17 slices shown (1 of 2)]
[im 1/17]
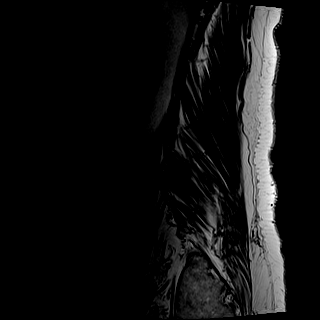
[im 3/17]
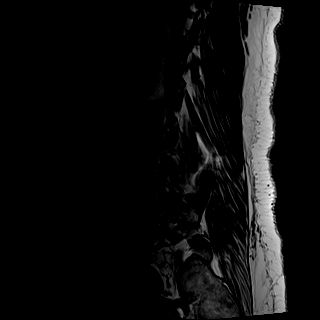
[im 6/17]
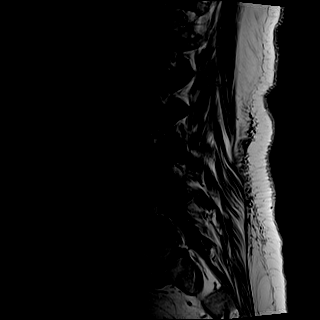
[im 9/17]
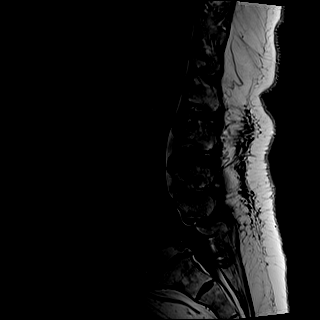
[im 11/17]
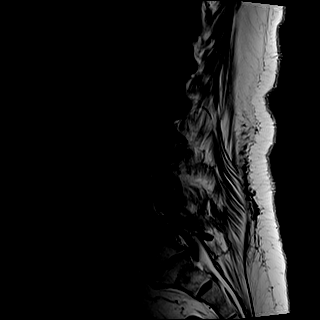
[im 14/17]
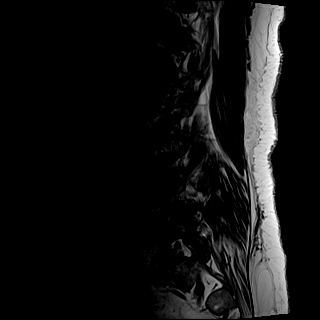
[im 17/17]
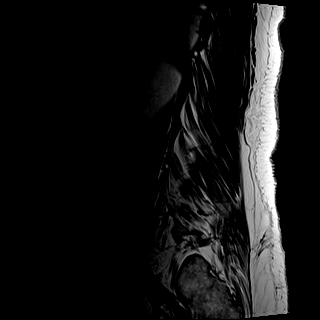

[Series 7: STIR · sagittal · 4.0mm · 0.41mm/px · 2 of 17 slices shown]
[im 1/17]
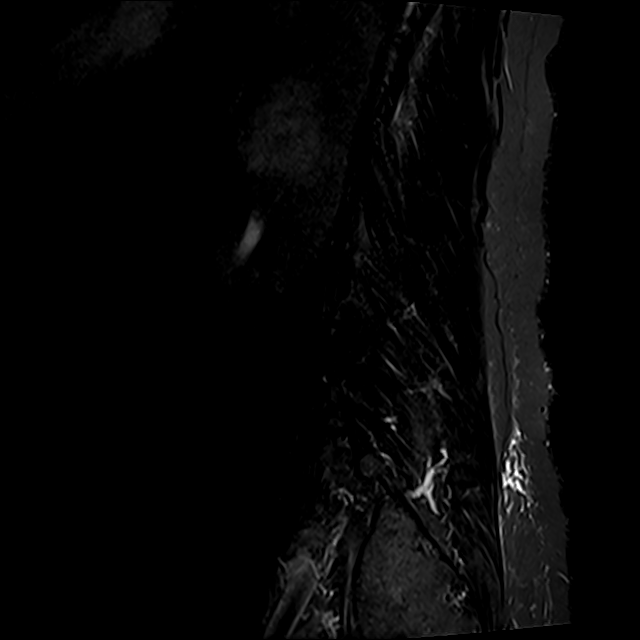
[im 3/17]
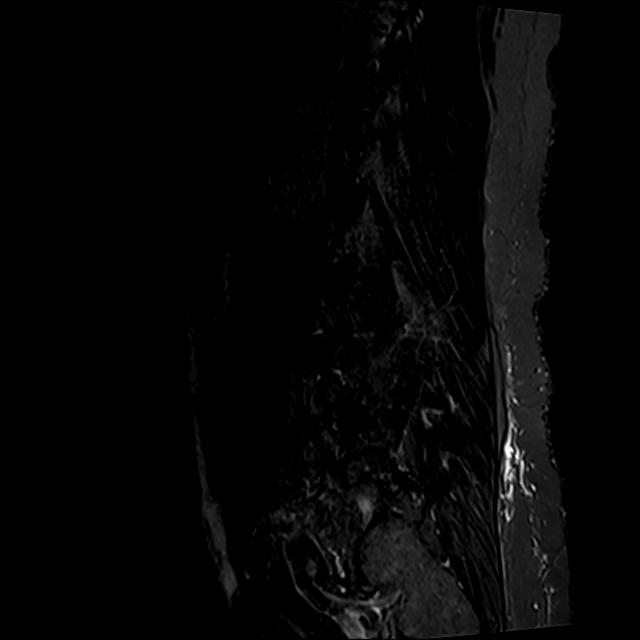

[Series 8: T2 · axial · 4.0mm · 0.78mm/px · z∈[-161,+65]mm · 8 of 34 slices shown (2 of 2)]
[im 1/34]
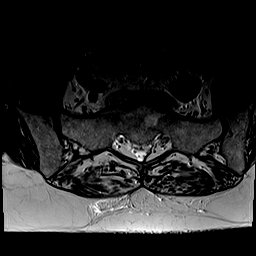
[im 6/34]
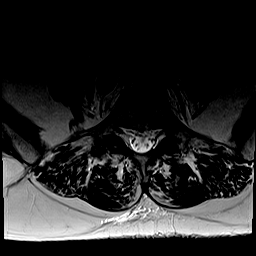
[im 11/34]
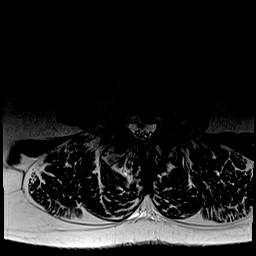
[im 16/34]
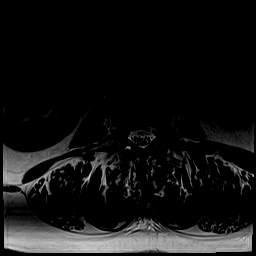
[im 18/34]
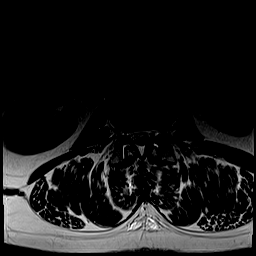
[im 23/34]
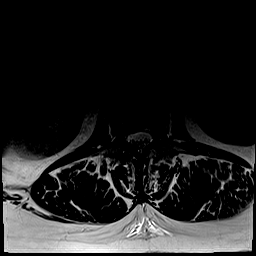
[im 28/34]
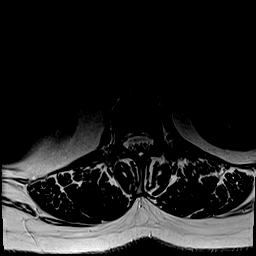
[im 34/34]
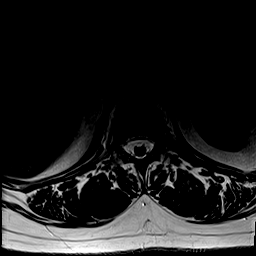

[Series 9: T1 · axial · 4.0mm · 0.39mm/px · z∈[-161,+65]mm · 8 of 34 slices shown (2 of 2)]
[im 1/34]
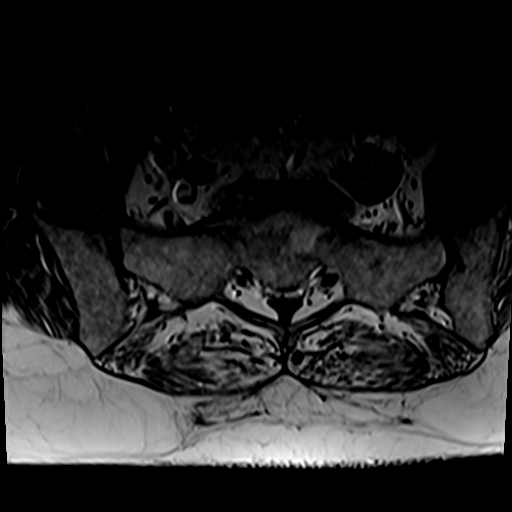
[im 6/34]
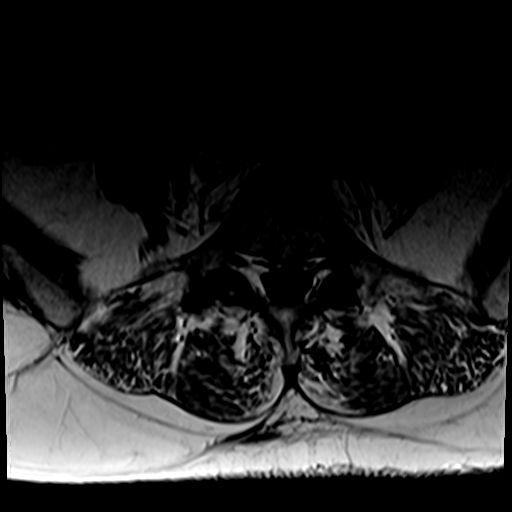
[im 11/34]
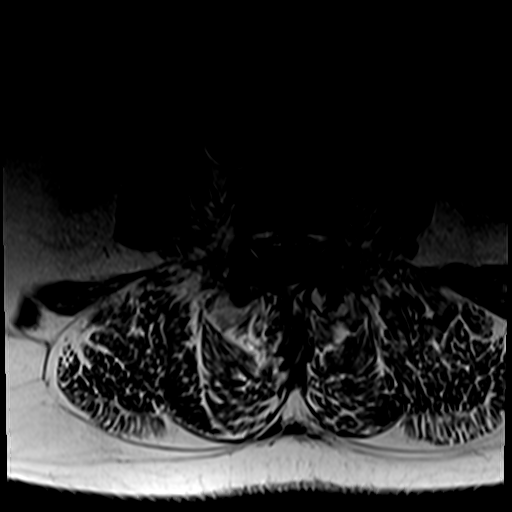
[im 16/34]
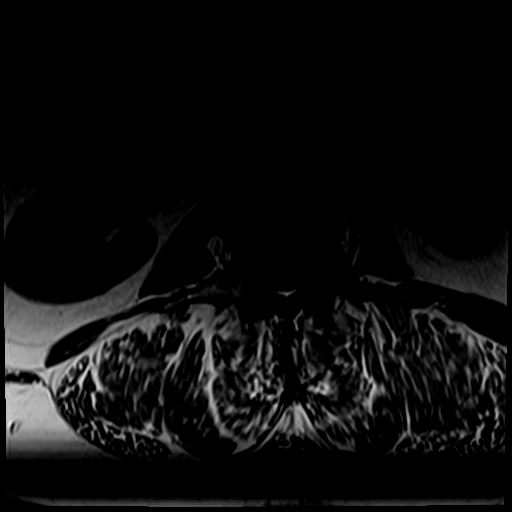
[im 18/34]
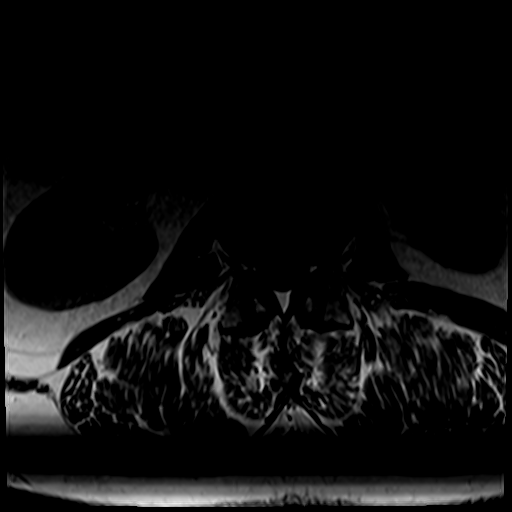
[im 23/34]
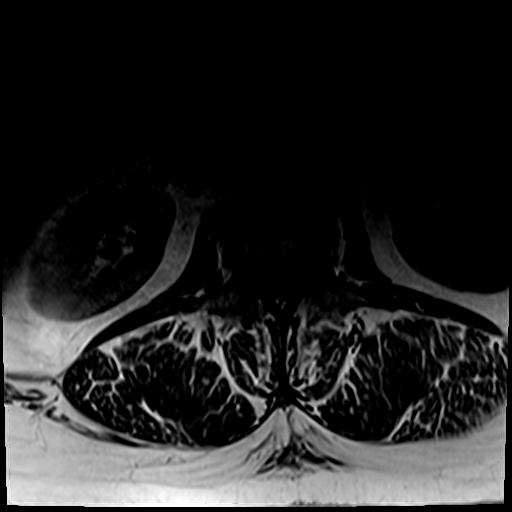
[im 28/34]
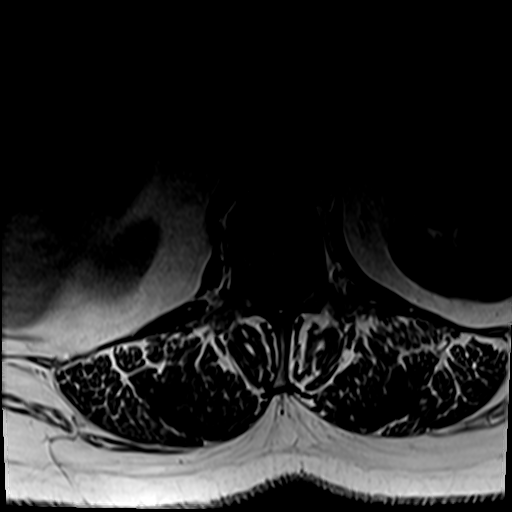
[im 34/34]
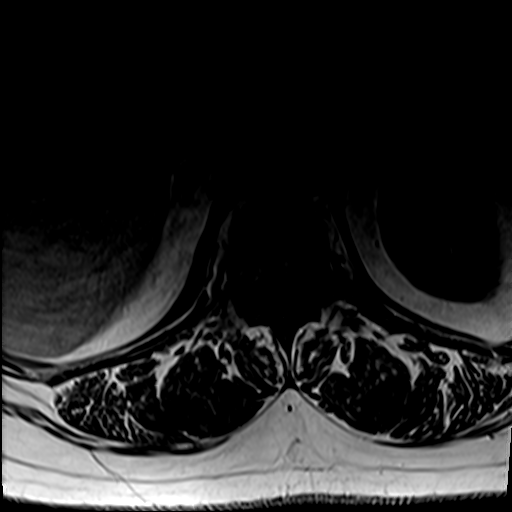

[31 of 48 positions shown; findings below may reference images not displayed]

FINDINGS: Segmentation:  5 lumbar type vertebral bodies.

Alignment:  Degenerative anterolisthesis at L4-5 measuring 6 mm.

Vertebrae:  No fracture or primary bone lesion.

Conus medullaris and cauda equina: Conus extends to the L1-2 level.
Conus and cauda equina appear normal.

Paraspinal and other soft tissues: Negative

Disc levels:

Mild non-compressive disc bulges at T11-12, T12-L1 and L1-2.

L2-3: Mild bulging of the disc. Mild facet and ligamentous
hypertrophy. No compressive stenosis.

L3-4: Bulging of the disc more prominent towards the left. Facet and
ligamentous hypertrophy. Stenosis of the lateral recesses left more
than right. Neural compression could possibly occur on the left.

L4-5: Chronic facet arthropathy with 6 mm of anterolisthesis.
Bulging of the disc. Mild stenosis of both lateral recesses and
neural foramina.

L5-S1: Chronic disc degeneration with loss of disc height. Facet
osteoarthritis. No compressive narrowing of the canal or foramina.
IMPRESSION: L2-3: Disc bulge. Bilateral facet degeneration and hypertrophy. No
compressive stenosis.

L3-4: Disc bulge more prominent towards the left. Facet and
ligamentous hypertrophy. Left lateral recess stenosis that could
possibly be symptomatic.

L4-5: Chronic facet arthropathy with 6 mm of anterolisthesis.
Bulging of the disc. Stenosis of the lateral recesses and foramina
but without distinct focal neural compression.

L5-S1: Chronic disc degeneration and facet osteoarthritis. No
stenosis.

Findings could certainly relate to back pain. As far as focal neural
compression, the most likely location would seem to be the left
lateral recess at L3-4.

## 2020-03-30 ENCOUNTER — Other Ambulatory Visit (HOSPITAL_COMMUNITY): Payer: Self-pay | Admitting: Psychiatry

## 2020-04-03 ENCOUNTER — Other Ambulatory Visit: Payer: PPO

## 2020-04-03 DIAGNOSIS — R7303 Prediabetes: Secondary | ICD-10-CM

## 2020-04-03 MED ORDER — FREESTYLE LIBRE 2 SENSOR MISC: 0 refills | Status: DC

## 2020-04-03 MED ORDER — FREESTYLE LIBRE 2 READER DEVI: 1 refills | Status: DC

## 2020-04-05 ENCOUNTER — Ambulatory Visit (INDEPENDENT_AMBULATORY_CARE_PROVIDER_SITE_OTHER): Payer: PPO

## 2020-04-05 DIAGNOSIS — R2689 Other abnormalities of gait and mobility: Secondary | ICD-10-CM

## 2020-04-05 DIAGNOSIS — Z0000 Encounter for general adult medical examination without abnormal findings: Secondary | ICD-10-CM | POA: Diagnosis not present

## 2020-04-05 NOTE — Progress Notes (Signed)
PCP notes:  Health Maintenance: Colonoscopy- scheduled 04/24/2020 Mammogram- declined   Abnormal Screenings: none   Patient concerns: Needs home health services- referral processed   Nurse concerns: none   Next PCP appt.: none

## 2020-04-05 NOTE — Patient Instructions (Signed)
Emily Moon , Thank you for taking time to come for your Medicare Wellness Visit. I appreciate your ongoing commitment to your health goals. Please review the following plan we discussed and let me know if I can assist you in the future.   Screening recommendations/referrals: Colonoscopy: scheduled 04/24/2020 Mammogram: declined Bone Density: age 59 Recommended yearly ophthalmology/optometry visit for glaucoma screening and checkup Recommended yearly dental visit for hygiene and checkup  Vaccinations: Influenza vaccine: Up to date, completed 06/26/2019 Pneumococcal vaccine: Completed series Tdap vaccine: decline Shingles vaccine: discussed    Advanced directives: Advance directive discussed with you today. Even though you declined this today please call our office should you change your mind and we can give you the proper paperwork for you to fill out.  Conditions/risks identified: hyperlipidemia  Next appointment: none  Preventive Care 40-64 Years, Female Preventive care refers to lifestyle choices and visits with your health care provider that can promote health and wellness. What does preventive care include?  A yearly physical exam. This is also called an annual well check.  Dental exams once or twice a year.  Routine eye exams. Ask your health care provider how often you should have your eyes checked.  Personal lifestyle choices, including:  Daily care of your teeth and gums.  Regular physical activity.  Eating a healthy diet.  Avoiding tobacco and drug use.  Limiting alcohol use.  Practicing safe sex.  Taking low-dose aspirin daily starting at age 19.  Taking vitamin and mineral supplements as recommended by your health care provider. What happens during an annual well check? The services and screenings done by your health care provider during your annual well check will depend on your age, overall health, lifestyle risk factors, and family history of  disease. Counseling  Your health care provider may ask you questions about your:  Alcohol use.  Tobacco use.  Drug use.  Emotional well-being.  Home and relationship well-being.  Sexual activity.  Eating habits.  Work and work Statistician.  Method of birth control.  Menstrual cycle.  Pregnancy history. Screening  You may have the following tests or measurements:  Height, weight, and BMI.  Blood pressure.  Lipid and cholesterol levels. These may be checked every 5 years, or more frequently if you are over 64 years old.  Skin check.  Lung cancer screening. You may have this screening every year starting at age 2 if you have a 30-pack-year history of smoking and currently smoke or have quit within the past 15 years.  Fecal occult blood test (FOBT) of the stool. You may have this test every year starting at age 51.  Flexible sigmoidoscopy or colonoscopy. You may have a sigmoidoscopy every 5 years or a colonoscopy every 10 years starting at age 85.  Hepatitis C blood test.  Hepatitis B blood test.  Sexually transmitted disease (STD) testing.  Diabetes screening. This is done by checking your blood sugar (glucose) after you have not eaten for a while (fasting). You may have this done every 1-3 years.  Mammogram. This may be done every 1-2 years. Talk to your health care provider about when you should start having regular mammograms. This may depend on whether you have a family history of breast cancer.  BRCA-related cancer screening. This may be done if you have a family history of breast, ovarian, tubal, or peritoneal cancers.  Pelvic exam and Pap test. This may be done every 3 years starting at age 56. Starting at age 76, this may be  done every 5 years if you have a Pap test in combination with an HPV test.  Bone density scan. This is done to screen for osteoporosis. You may have this scan if you are at high risk for osteoporosis. Discuss your test results,  treatment options, and if necessary, the need for more tests with your health care provider. Vaccines  Your health care provider may recommend certain vaccines, such as:  Influenza vaccine. This is recommended every year.  Tetanus, diphtheria, and acellular pertussis (Tdap, Td) vaccine. You may need a Td booster every 10 years.  Zoster vaccine. You may need this after age 57.  Pneumococcal 13-valent conjugate (PCV13) vaccine. You may need this if you have certain conditions and were not previously vaccinated.  Pneumococcal polysaccharide (PPSV23) vaccine. You may need one or two doses if you smoke cigarettes or if you have certain conditions. Talk to your health care provider about which screenings and vaccines you need and how often you need them. This information is not intended to replace advice given to you by your health care provider. Make sure you discuss any questions you have with your health care provider. Document Released: 11/29/2015 Document Revised: 07/22/2016 Document Reviewed: 09/03/2015 Elsevier Interactive Patient Education  2017 [place] Prevention in the Home Falls can cause injuries. They can happen to people of all ages. There are many things you can do to make your home safe and to help prevent falls. What can I do on the outside of my home?  Regularly fix the edges of walkways and driveways and fix any cracks.  Remove anything that might make you trip as you walk through a door, such as a raised step or threshold.  Trim any bushes or trees on the path to your home.  Use bright outdoor lighting.  Clear any walking paths of anything that might make someone trip, such as rocks or tools.  Regularly check to see if handrails are loose or broken. Make sure that both sides of any steps have handrails.  Any raised decks and porches should have guardrails on the edges.  Have any leaves, snow, or ice cleared regularly.  Use sand or salt on walking  paths during winter.  Clean up any spills in your garage right away. This includes oil or grease spills. What can I do in the bathroom?  Use night lights.  Install grab bars by the toilet and in the tub and shower. Do not use towel bars as grab bars.  Use non-skid mats or decals in the tub or shower.  If you need to sit down in the shower, use a plastic, non-slip stool.  Keep the floor dry. Clean up any water that spills on the floor as soon as it happens.  Remove soap buildup in the tub or shower regularly.  Attach bath mats securely with double-sided non-slip rug tape.  Do not have throw rugs and other things on the floor that can make you trip. What can I do in the bedroom?  Use night lights.  Make sure that you have a light by your bed that is easy to reach.  Do not use any sheets or blankets that are too big for your bed. They should not hang down onto the floor.  Have a firm chair that has side arms. You can use this for support while you get dressed.  Do not have throw rugs and other things on the floor that can make you trip. What  can I do in the kitchen?  Clean up any spills right away.  Avoid walking on wet floors.  Keep items that you use a lot in easy-to-reach places.  If you need to reach something above you, use a strong step stool that has a grab bar.  Keep electrical cords out of the way.  Do not use floor polish or wax that makes floors slippery. If you must use wax, use non-skid floor wax.  Do not have throw rugs and other things on the floor that can make you trip. What can I do with my stairs?  Do not leave any items on the stairs.  Make sure that there are handrails on both sides of the stairs and use them. Fix handrails that are broken or loose. Make sure that handrails are as long as the stairways.  Check any carpeting to make sure that it is firmly attached to the stairs. Fix any carpet that is loose or worn.  Avoid having throw rugs at  the top or bottom of the stairs. If you do have throw rugs, attach them to the floor with carpet tape.  Make sure that you have a light switch at the top of the stairs and the bottom of the stairs. If you do not have them, ask someone to add them for you. What else can I do to help prevent falls?  Wear shoes that:  Do not have high heels.  Have rubber bottoms.  Are comfortable and fit you well.  Are closed at the toe. Do not wear sandals.  If you use a stepladder:  Make sure that it is fully opened. Do not climb a closed stepladder.  Make sure that both sides of the stepladder are locked into place.  Ask someone to hold it for you, if possible.  Clearly mark and make sure that you can see:  Any grab bars or handrails.  First and last steps.  Where the edge of each step is.  Use tools that help you move around (mobility aids) if they are needed. These include:  Canes.  Walkers.  Scooters.  Crutches.  Turn on the lights when you go into a dark area. Replace any light bulbs as soon as they burn out.  Set up your furniture so you have a clear path. Avoid moving your furniture around.  If any of your floors are uneven, fix them.  If there are any pets around you, be aware of where they are.  Review your medicines with your doctor. Some medicines can make you feel dizzy. This can increase your chance of falling. Ask your doctor what other things that you can do to help prevent falls. This information is not intended to replace advice given to you by your health care provider. Make sure you discuss any questions you have with your health care provider. Document Released: 08/29/2009 Document Revised: 04/09/2016 Document Reviewed: 12/07/2014 Elsevier Interactive Patient Education  2017 [employer].

## 2020-04-05 NOTE — Progress Notes (Signed)
Subjective:   SURAIYA BARIS is a 59 y.o. female who presents for Medicare Annual (Subsequent) preventive examination.  Review of Systems: N/A   I connected with the patient today by telephone and verified that I am speaking with the correct person using two identifiers. Location patient: home Location nurse: work Persons participating in the virtual visit: patient, Marine scientist.   I discussed the limitations, risks, security and privacy concerns of performing an evaluation and management service by telephone and the availability of in person appointments. I also discussed with the patient that there may be a patient responsible charge related to this service. The patient expressed understanding and verbally consented to this telephonic visit.    Interactive audio and video telecommunications were attempted between this nurse and patient, however failed, due to patient having technical difficulties OR patient did not have access to video capability.  We continued and completed visit with audio only.     Cardiac Risk Factors include: dyslipidemia     Objective:     Vitals: LMP 08/23/2014   There is no height or weight on file to calculate BMI.  Advanced Directives 04/05/2020 03/06/2020 02/19/2020 02/06/2019 05/24/2017 03/04/2017 02/04/2017  Does Patient Have a Medical Advance Directive? No No No No No No No  Would patient like information on creating a medical advance directive? No - Patient declined Yes (Inpatient - patient requests chaplain consult to create a medical advance directive);Yes (Inpatient - patient defers creating a medical advance directive at this time - Information given);Yes (Inpatient - patient defers creating a medical advance directive and declines information at this time);Yes (ED - Information included in AVS);Yes (MAU/Ambulatory/Procedural Areas - Information given) No - Patient declined No - Patient declined No - Patient declined No - Patient declined No - Patient declined      Tobacco Social History   Tobacco Use  Smoking Status Former Smoker  . Years: 25.00  . Types: Cigarettes  . Quit date: 11/16/2012  . Years since quitting: 7.3  Smokeless Tobacco Never Used  Tobacco Comment   occasional use     Counseling given: Not Answered Comment: occasional use   Clinical Intake:  Pre-visit preparation completed: Yes  Pain : 0-10 Pain Score: 5  Pain Type: Chronic pain Pain Location: (all over body) Pain Descriptors / Indicators: Aching Pain Onset: More than a month ago Pain Frequency: Intermittent     Nutritional Risks: None Diabetes: No  How often do you need to have someone help you when you read instructions, pamphlets, or other written materials from your doctor or pharmacy?: 1 - Never  Interpreter Needed?: No  Information entered by :: CJohnson, LPN  Past Medical History:  Diagnosis Date  . Arthritis    osteo  . Asthma   . Bipolar disorder ([place]) 05/21/14  . Cataracts, bilateral   . DDD (degenerative disc disease), cervical    also back  . Depression   . Dizziness    Positional  . Edema    feet/legs  . Fibromyalgia syndrome   . Fungal infection    Finger nails  . GERD (gastroesophageal reflux disease)   . Gout   . Headache    seasonal allergies  . Heart palpitations   . 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 Hypercholesterolemia   . Multiple sclerosis (HCC)    weakness  . Nephrolithiasis    kidney stones  . Osteoporosis    osteoarthritis  . Pneumonia   . PONV (postoperative nausea and vomiting)  no problem after cataract surgery  . Psoriasis   . Renal stone   . Shortness of breath dyspnea    wheezing  . Sleep apnea 2012   sleep study / slight, no interventions  . Urinary frequency    Past Surgical History:  Procedure Laterality Date  . CATARACT EXTRACTION W/PHACO Left 05/21/2015   Procedure: CATARACT EXTRACTION PHACO AND INTRAOCULAR LENS PLACEMENT (IOC);  Surgeon: Birder Robson, MD;  Location: [facility];   Service: Ophthalmology;  Laterality: Left;  [place] 00:35 AP% 22.9 CDE 8.11 fluid pack lot [id number]  . CATARACT EXTRACTION W/PHACO Right 06/04/2015   Procedure: CATARACT EXTRACTION PHACO AND INTRAOCULAR LENS PLACEMENT (IOC);  Surgeon: Birder Robson, MD;  Location: [facility];  Service: Ophthalmology;  Laterality: Right;  US:00:48 AP%: 10.5 CDE:5.08 Fluid lot [id number]  . CYSTOSCOPY/URETEROSCOPY/HOLMIUM LASER/STENT PLACEMENT Bilateral 09/22/2016   Procedure: CYSTOSCOPY/URETEROSCOPY/HOLMIUM LASER/STENT PLACEMENT;  Surgeon: Hollice Espy, MD;  Location: [facility];  Service: Urology;  Laterality: Bilateral;  . EYE SURGERY  2015   tissue biopsy  . FOOT SURGERY  2015  . JOINT REPLACEMENT Left 2013   hip replacement  . LITHOTRIPSY    . PTOSIS REPAIR Bilateral 02/18/2016   Procedure: BILATERAL PTOSIS REPAIR UPPER EYELIDS;  Surgeon: Karle Starch, MD;  Location: [place];  Service: Ophthalmology;  Laterality: Bilateral;  LEAVE PT EARLY AM  . thumb surgery Right   . TONSILLECTOMY  1973   Family History  Problem Relation Age of Onset  . Cancer Father        Abdomen with mastasis  . Cancer Mother   . Heart disease Mother   . Kidney disease Neg Hx   . Bladder Cancer Neg Hx   . Prostate cancer Neg Hx   . Kidney cancer Neg Hx    Social History   Socioeconomic History  . Marital status: Divorced    Spouse name: Not on file  . Number of children: 1  . Years of education: Not on file  . Highest education level: Not on file  Occupational History  . 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 Smoking status: Former Smoker    Years: 25.00    Types: Cigarettes    Quit date: 11/16/2012    Years since quitting: 7.3  . Smokeless tobacco: Never Used  . Tobacco comment: occasional use  Substance and Sexual Activity  . Alcohol use: No    Alcohol/week: 0.0 standard drinks  . Drug use: No    Types: Methylphenidate  . Sexual activity: Never  Other  Topics Concern  . Not on file  Social History Narrative  . Not on file   Social Determinants of Health   Financial Resource Strain: Low Risk   . Difficulty of Paying Living Expenses: Not hard at all  Food Insecurity: No Food Insecurity  . Worried About Charity fundraiser in the Last Year: Never true  . Ran Out of Food in the Last Year: Never true  Transportation Needs: No Transportation Needs  . Lack of Transportation (Medical): No  . Lack of Transportation (Non-Medical): No  Physical Activity: Inactive  . Days of Exercise per Week: 0 days  . Minutes of Exercise per Session: 0 min  Stress: Stress Concern Present  . Feeling of Stress : Very much  Social Connections:   . Frequency of Communication with Friends and Family:   . Frequency of Social Gatherings with Friends and Family:   .  Attends Religious Services:   . Active Member of Clubs or Organizations:   . Attends Archivist Meetings:   Marland Kitchen Marital Status:     Outpatient Encounter Medications as of 04/05/2020  Medication Sig  . ASPERCREME LIDOCAINE EX Apply 1 application topically 4 (four) times daily as needed (for pain.).   Marland Kitchen aspirin 325 MG tablet Take 325 mg by mouth every 4 (four) hours as needed for headache.   . Azelastine HCl 137 MCG/SPRAY SOLN PLACE 2 SPRAYS INTO BOTH NOSTRILS 2 (TWO) TIMES DAILY  . BIOTIN PO Take 200 mg by mouth daily.   . cetirizine (ZYRTEC) 10 MG tablet Take 10 mg by mouth 2 (two) times daily.  . Cholecalciferol (VITAMIN D3) 10000 units capsule Take 30,000 Units by mouth daily.   . ciclopirox (LOPROX) 0.77 % cream Apply topically 2 (two) times daily.  . clobetasol (TEMOVATE) 0.05 % external solution Mix clobetasol solution with  CeraVe cream Use twice daily to affected areas.Avoid Face, groin and underarm  . Continuous Blood Gluc Receiver (FREESTYLE LIBRE 2 READER) DEVI Use as instruct to test blood sugar daily  . Continuous Blood Gluc Receiver (FREESTYLE LIBRE READER) DEVI Use receiver  daily to check blood sugar  . Continuous Blood Gluc Sensor (FREESTYLE LIBRE 2 SENSOR) MISC Use as instruct to test blood sugar daily  . Continuous Blood Gluc Sensor ([place]) MISC Place into skin for continuous check of blood sugar daily  . cyanocobalamin (,VITAMIN B-12,) 1000 MCG/ML injection Inject 1 mL (1,000 mcg total) into the muscle every 3 (three) months.  . desipramine (NORPRAMIN) 25 MG tablet TAKE 1 TABLET BY MOUTH IN THE MORNING  . diclofenac sodium (VOLTAREN) 1 % GEL Apply 4 g topically 4 (four) times daily.  Marland Kitchen docusate sodium (COLACE) 100 MG capsule Take 1 capsule (100 mg total) by mouth 2 (two) times daily.  Marland Kitchen doxycycline (PERIOSTAT) 20 MG tablet Take 20 mg by mouth 2 (two) times daily.  . DULoxetine (CYMBALTA) 20 MG capsule Take 2 capsules (40 mg total) by mouth daily.  . Evolocumab (REPATHA SURECLICK) [id number] MG/ML SOAJ Inject 1 Dose into the skin every 14 (fourteen) days.  . famotidine (PEPCID) 20 MG tablet Take 1 tablet (20 mg total) by mouth at bedtime.  . fluocinonide (LIDEX) 0.05 % external solution Apply 1 application topically 2 (two) times daily as needed. Apply to scalp twice daily as need for itch  . hydrocortisone 2.5 % cream Apply BID to affected area in underarms and groin prn flares  . hydroquinone 4 % cream 1 APPLICATION TOPICALLY DAILY AS NEEDED FOR BLEMISHES.  . hydrOXYzine (VISTARIL) 50 MG capsule TAKE 1 CAPSLE BY MOUTH AT NOON, 1 AT 6 PM, AND 1 AS NEEDED  . ketorolac (TORADOL) 10 MG tablet Take 1 tablet (10 mg total) by mouth every 8 (eight) hours as needed.  . lamoTRIgine (LAMICTAL) 150 MG tablet Take 2 tablets (300 mg total) by mouth at bedtime.  . levalbuterol (XOPENEX HFA) 45 MCG/ACT inhaler Inhale 1-2 puffs into the lungs every 6 (six) hours as needed for wheezing.  . levalbuterol (XOPENEX) 1.25 MG/3ML nebulizer solution USE 1 VIAL VIA NEBULIZER EVERY 4 HOURS AS NEEDED FOR WHEEZING OR SHORTNESS OF BREATH  . Lysine 1000 MG TABS Take  2,000-4,000 mg by mouth at bedtime. 2000 mg scheduled at bedtime and patient will take 4000 mg if she has outbreak  . Magnesium 500 MG TABS Take 1 tablet by mouth at bedtime.  Marland Kitchen neomycin-bacitracin-polymyxin (NEOSPORIN) [phone]  ointment Apply 1 application topically 4 (four) times daily as needed (for cut/scrapes.).  Marland Kitchen pantoprazole (PROTONIX) 40 MG tablet Take 1 tablet (40 mg total) by mouth daily.  . phenazopyridine (AZO-TABS) 95 MG tablet Take 95 mg by mouth 3 (three) times daily as needed for pain.  . polyethylene glycol powder (GLYCOLAX/MIRALAX) powder MIX 17 GRAMS (1 CAPFUL) WITH 4-8 OZ OF LIQUID AND TAKE BY MOUTH TWICE DAILY AS NEEDED  . temazepam (RESTORIL) 30 MG capsule TAKE ONE TO TWO CAPSULES BY MOUTH NIGHTLY AT BEDTIME  . tiZANidine (ZANAFLEX) 4 MG tablet TAKE 2 TABLETS (8 MG TOTAL) BY MOUTH EVERY 6 (SIX) HOURS AS NEEDED FOR MUSCLE SPASMS.  Marland Kitchen traZODone (DESYREL) 50 MG tablet Take 2 tablets (100 mg total) by mouth at bedtime.   No facility-administered encounter medications on file as of 04/05/2020.    Activities of Daily Living In your present state of health, do you have any difficulty performing the following activities: 04/05/2020  Hearing? N  Vision? N  Difficulty concentrating or making decisions? Y  Comment Patient states that she has a terrible memory  Walking or climbing stairs? N  Dressing or bathing? N  Doing errands, shopping? N  Preparing Food and eating ? N  Using the Toilet? N  In the past six months, have you accidently leaked urine? N  Do you have problems with loss of bowel control? N  Managing your Medications? N  Managing your Finances? N  Housekeeping or managing your Housekeeping? N  Some recent data might be hidden    Patient Care Team: [name], NP as PCP - General (Internal Medicine)    Assessment:   This is a routine wellness examination for [place].  Exercise Activities and Dietary recommendations Current Exercise Habits: The  patient does not participate in regular exercise at present, Exercise limited by: None identified  Goals    . Patient Stated     04/05/2020, I will maintain and continue medications as prescribed.        Fall Risk Fall Risk  04/05/2020 03/06/2020 11/27/2019 11/24/2017 10/25/2015  Falls in the past year? 1 1 1  Yes Yes  Number falls in past yr: 1 1 1  - 1  Injury with Fall? 0 - 1 Yes -  Risk for fall due to : History of fall(s);Impaired balance/gait - - - Impaired balance/gait  Follow up Falls evaluation completed;Falls prevention discussed - - - -   Is the patient's home free of loose throw rugs in walkways, pet beds, electrical cords, etc?   yes      Grab bars in the bathroom? no      Handrails on the stairs?   yes      Adequate lighting?   yes  Timed Get Up and Go performed: N/A  Depression Screen PHQ 2/9 Scores 04/05/2020 03/06/2020 11/27/2019 11/24/2017  PHQ - 2 Score 6 6 0 6  PHQ- 9 Score 6 - - 24     Cognitive Function MMSE - Mini Mental State Exam 04/05/2020  Not completed: Refused       Mini Cog  Mini-Cog screen was not completed. Patient refused. Maximum score is 22. A value of 0 denotes this part of the MMSE was not completed or the patient failed this part of the Mini-Cog screening.  Immunization History  Administered Date(s) Administered  . Influenza Split 08/05/2011  . Influenza, Seasonal, Injecte, Preservative Fre 06/26/2018  . Influenza,inj,Quad PF,6+ Mos 08/17/2013, 07/13/2015, 07/06/2017, 06/26/2018, 06/26/2019  . Influenza-Unspecified 07/08/2016, 06/18/2017,  06/26/2018  . PFIZER SARS-COV-2 Vaccination 02/06/2020, 02/28/2020  . Pneumococcal Conjugate-13 07/05/2014  . Pneumococcal Polysaccharide-23 07/13/2015    Qualifies for Shingles Vaccine: Yes   Screening Tests Health Maintenance  Topic Date Due  . HIV Screening  Never done  . COLONOSCOPY  Never done  . MAMMOGRAM  04/05/2021 (Originally 05/16/2016)  . TETANUS/TDAP  04/05/2024 (Originally 04/08/1981)  .  INFLUENZA VACCINE  06/16/2020  . PAP SMEAR-Modifier  11/24/2020  . COVID-19 Vaccine  Completed  . Hepatitis C Screening  Completed    Cancer Screenings: Lung: Low Dose CT Chest recommended if Age 15-80 years, 30 pack-year currently smoking OR have quit w/in 15 years. Patient does not qualify. Breast:  Up to date on Mammogram: No, patient declined   Up to date of Bone Density/Dexa: N/A, starts at age 78 Colorectal: scheduled 04/24/2020  Additional Screenings:  Hepatitis C Screening: 12/17/2016     Plan:   Patient will maintain and continue medications as prescribed.    I have personally reviewed and noted the following in the patient's chart:   . Medical and social history . Use of alcohol, tobacco or illicit drugs  . Current medications and supplements . Functional ability and status . Nutritional status . Physical activity . Advanced directives . List of other physicians . Hospitalizations, surgeries, and ER visits in previous 12 months . Vitals . Screenings to include cognitive, depression, and falls . Referrals and appointments  In addition, I have reviewed and discussed with patient certain preventive protocols, quality metrics, and best practice recommendations. A written personalized care plan for preventive services as well as general preventive health recommendations were provided to patient.     Andrez Grime, LPN  [phone]

## 2020-04-16 DIAGNOSIS — L409 Psoriasis, unspecified: Secondary | ICD-10-CM

## 2020-04-16 DIAGNOSIS — M159 Polyosteoarthritis, unspecified: Secondary | ICD-10-CM

## 2020-04-17 ENCOUNTER — Ambulatory Visit: Payer: PPO | Admitting: Internal Medicine

## 2020-04-17 ENCOUNTER — Other Ambulatory Visit: Payer: Self-pay

## 2020-04-17 ENCOUNTER — Encounter: Payer: Self-pay | Admitting: Internal Medicine

## 2020-04-17 VITALS — BP 132/82 | HR 116 | Temp 99.2°F | Ht 69.0 in | Wt 241.2 lb

## 2020-04-17 DIAGNOSIS — G4733 Obstructive sleep apnea (adult) (pediatric): Secondary | ICD-10-CM

## 2020-04-17 DIAGNOSIS — R0602 Shortness of breath: Secondary | ICD-10-CM | POA: Diagnosis not present

## 2020-04-17 NOTE — Patient Instructions (Signed)
Recommend Sleep study Recommend PFT's

## 2020-04-17 NOTE — Progress Notes (Signed)
Name: Emily Moon MRN: [id number] DOB: 1961/10/03     CONSULTATION DATE: 9.12.19 REFERRING MD : Carlis Abbott  STUDIES:  11.20.18  CXR independently reviewed by Me today No acute pneumonia No effusions NL looking CXR  CC Follow up OSA   HISTORY OF PRESENT ILLNESS: +OSA severe OSA AHI 35 Patient was started on CPAP and she hates the mask and how it makes her feel Her AHI decreased to 3.9 She DOES NOT WANT TO WEAR MASK as documented at last [place] PATIENT Her compliance is not very good due to the fact that she has had difficult time with the mask She has tried different masks at different times And she is miserable with the mask She no longer wants to be treated with CPAP therapy I have advised to seek alternative methods include dental devices from dentistry's in the surrounding area   Patient now wants to be re-assess for OSA    Follow up sleep related issues with sleep related to excessive daytime sleepiness Patient  has been having sleep problems for many years Patient has been having excessive daytime sleepiness for a long time Patient has been having extreme fatigue and tiredness, lack of energy +  very Loud snoring every night + struggling breathe at night and gasps for air   Discussed sleep data and reviewed with patient.  Encouraged proper weight management.  Discussed driving precautions and its relationship with hypersomnolence.  Discussed operating dangerous equipment and its relationship with hypersomnolence.  Discussed sleep hygiene, and benefits of a fixed sleep waked time.  The importance of getting eight or more hours of sleep discussed with patient.  Discussed limiting the use of the computer and television before bedtime.  Decrease naps during the day, so night time sleep will become enhanced.  Limit caffeine, and sleep deprivation.  HTN, stroke, and heart failure are potential risk factors.     EPWORTH SLEEP SCORE 16      Review of Systems:  Gen:  Denies  fever, sweats, chills weight loss  HEENT: Denies blurred vision, double vision, ear pain, eye pain, hearing loss, nose bleeds, sore throat Cardiac:  No dizziness, chest pain or heaviness, chest tightness,edema, No JVD Resp:   No cough, -sputum production, -shortness of breath,-wheezing, -hemoptysis,  Gi: Denies swallowing difficulty, stomach pain, nausea or vomiting, diarrhea, constipation, bowel incontinence Gu:  Denies bladder incontinence, burning urine Ext:   Denies Joint pain, stiffness or swelling Skin: Denies  skin rash, easy bruising or bleeding or hives Endoc:  Denies polyuria, polydipsia , polyphagia or weight change Psych:   Denies depression, insomnia or hallucinations  Other:  All other systems negative  BP 132/82 (BP Location: Left Arm, Cuff Size: Normal)   Pulse (!) 116   Temp 99.2 F (37.3 C) (Oral)   Ht 5\' 9"  (1.753 m)   Wt 241 lb 3.2 oz (109.4 kg)   LMP 08/23/2014   SpO2 96%   BMI 35.62 kg/m    Physical Examination:   General Appearance: No distress  Neuro:without focal findings,  speech normal,  HEENT: PERRLA, EOM intact.   Pulmonary: normal breath sounds, No wheezing.  CardiovascularNormal S1,S2.  No m/r/g.   Abdomen: Benign, Soft, non-tender. Renal:  No costovertebral tenderness  GU:  Not performed at this time. Endoc: No evident thyromegaly Skin:   warm, no rashes, no ecchymosis  Extremities: normal, no cyanosis, clubbing. PSYCHIATRIC: Mood, affect within normal limits.   ALL OTHER ROS ARE  NEGATIVE      ASSESSMENT / PLAN:  59 year old white female seen today for follow-up excessive daytime sleepiness with a previous history of an diagnosis of severe sleep apnea with an AHI of 35 Patient had tried several masks but could not tolerate CPAP masks At this time patient has excessive daytime sleepiness with signs symptoms of sleep apnea Patient would like to try to restart  therapy at this time Patient has significantly gained some weight over the last several years up to 240 pounds  OSA Patient may need repeat sleep test study Patient will need to start auto CPAP therapy soon as possible  Obesity -recommend significant weight loss -recommend changing diet  Deconditioned state -Recommend increased daily activity and exercise  Former smoker Patient with chronic shortness of breath and dyspnea exertion Most likely related to increased weight gain however will need to rule out underlying obstructive and restrictive lung disease with pulmonary function testing   COVID-19 EDUCATION: The signs and symptoms of COVID-19 were discussed with the patient and how to seek care for testing (follow up with PCP or arrange E-visit).  The importance of social distancing was discussed today.  MEDICATION ADJUSTMENTS/LABS AND TESTS ORDERED: Sleep study PFT's CURRENT MEDICATIONS REVIEWED AT LENGTH WITH PATIENT TODAY   Patient/Family are satisfied with Plan of action and management. All questions answered  Follow up in 6 months  TOTAL TIME SPENT 31 mins  Donjuan Robison Patricia Pesa, M.D.  [facility]  Medical Director [place] Director [facility] Cardio-Pulmonary Department

## 2020-04-19 ENCOUNTER — Ambulatory Visit
Admission: RE | Admit: 2020-04-19 | Discharge: 2020-04-19 | Disposition: A | Discharge status: Home / Self Care | Payer: PPO | Source: Ambulatory Visit | Attending: Physical Medicine & Rehabilitation | Admitting: Physical Medicine & Rehabilitation

## 2020-04-19 ENCOUNTER — Other Ambulatory Visit: Payer: PPO

## 2020-04-19 ENCOUNTER — Other Ambulatory Visit: Payer: Self-pay

## 2020-04-19 DIAGNOSIS — M5412 Radiculopathy, cervical region: Secondary | ICD-10-CM

## 2020-04-19 DIAGNOSIS — M50122 Cervical disc disorder at C5-C6 level with radiculopathy: Secondary | ICD-10-CM | POA: Diagnosis not present

## 2020-04-19 DIAGNOSIS — M50121 Cervical disc disorder at C4-C5 level with radiculopathy: Secondary | ICD-10-CM | POA: Diagnosis not present

## 2020-04-19 IMAGING — XA DG INJECT/[PERSON_NAME] INC NEEDLE/CATH/PLC EPI/CERV/THOR W/IMG
2 series · 2 of 2 positions shown · non-contrast
Comparison: none

CLINICAL DATA: Cervical radiculitis. Displacement cervical discs at
C3-4, C4-5 C5-6, and C6-7. Bilateral upper extremity numbness and
weakness.

[Series 1: ortho adipose · 1 of 1 slices shown (1 of 2)]
[im 1/1]
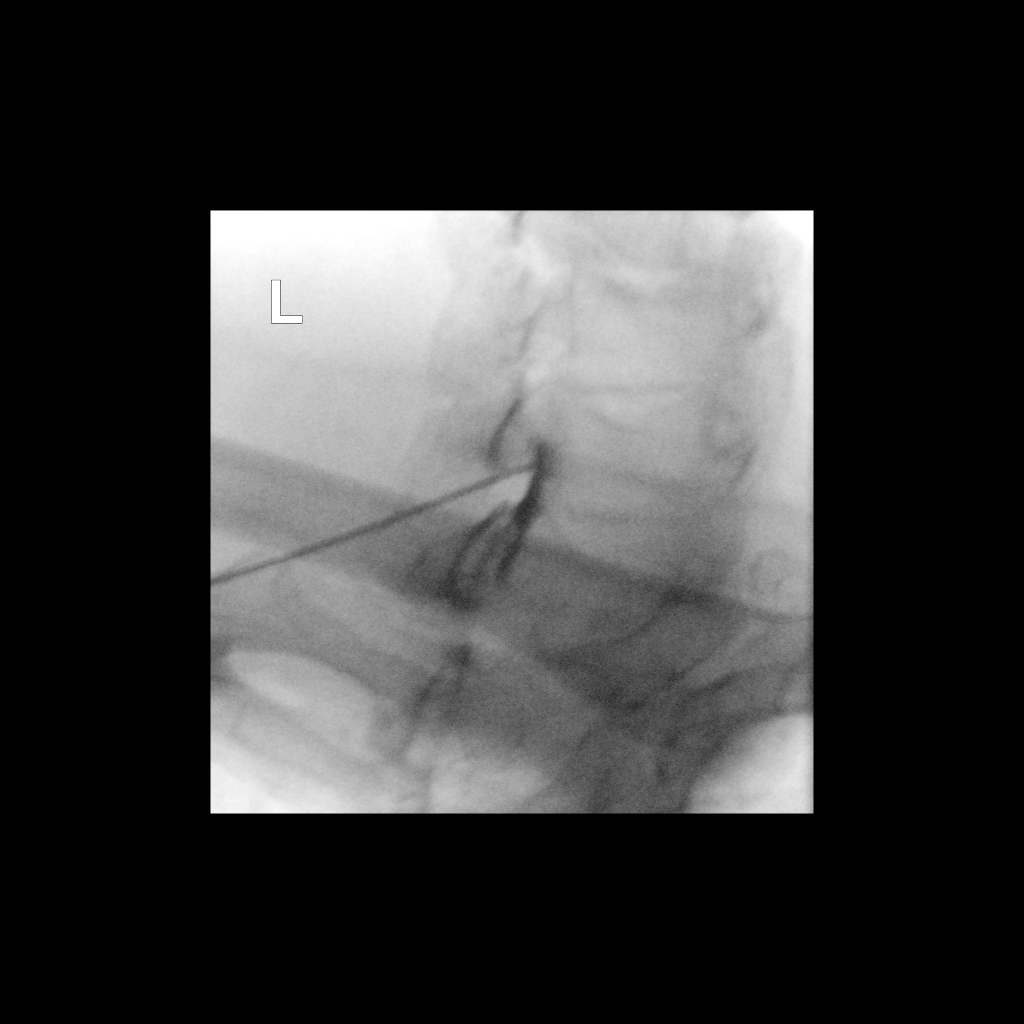

[Series 2: ortho adipose · 1 of 1 slices shown (2 of 2)]
[im 1/1]
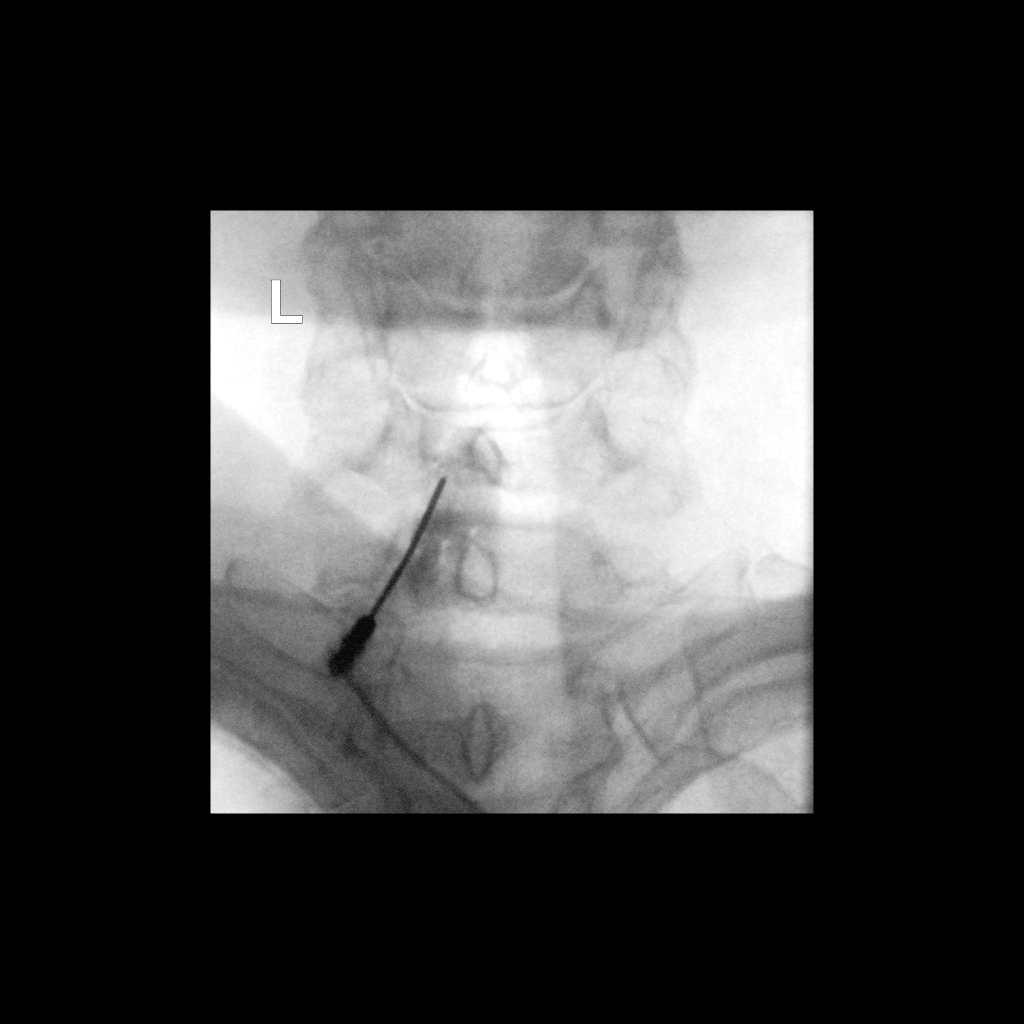

[2 of 2 positions shown; findings below may reference images not displayed]

FLUOROSCOPY TIME:  Radiation Exposure Index (as provided by the
fluoroscopic device): 10.86 uGy*m2

PROCEDURE:
CERVICAL EPIDURAL INJECTION

An interlaminar approach was performed on the left at C7-T1. A 20
gauge epidural needle was advanced using loss-of-resistance
technique.

DIAGNOSTIC EPIDURAL INJECTION

Injection of Isovue-M 300 shows a good epidural pattern with spread
above and below the level of needle placement, primarily on the
left. No vascular opacification is seen. THERAPEUTIC

EPIDURAL INJECTION

1.5 ml of Kenalog 40 mixed with 1 ml of 1% Lidocaine and 2 ml of
normal saline were then instilled. The procedure was well-tolerated,
and the patient was discharged thirty minutes following the
injection in good condition.
IMPRESSION: Technically successful first epidural injection on the left at
C7-T1.

## 2020-04-19 MED ORDER — IOPAMIDOL (ISOVUE-M 300) INJECTION 61%
1.0000 mL | Freq: Once | INTRAMUSCULAR | Status: AC | PRN
  Administered 2020-04-19: 1 mL via EPIDURAL

## 2020-04-19 MED ORDER — TRIAMCINOLONE ACETONIDE 40 MG/ML IJ SUSP (RADIOLOGY)
60.0000 mg | Freq: Once | INTRAMUSCULAR | Status: AC
  Administered 2020-04-19: 60 mg via EPIDURAL

## 2020-04-19 NOTE — Discharge Instructions (Signed)

## 2020-04-20 ENCOUNTER — Other Ambulatory Visit: Payer: Self-pay | Admitting: Primary Care

## 2020-04-20 DIAGNOSIS — R7303 Prediabetes: Secondary | ICD-10-CM

## 2020-04-22 ENCOUNTER — Other Ambulatory Visit: Payer: PPO

## 2020-04-22 ENCOUNTER — Telehealth: Payer: Self-pay

## 2020-04-22 MED ORDER — NA SULFATE-K SULFATE-MG SULF 17.5-3.13-1.6 GM/177ML PO SOLN: 354.0000 mL | Freq: Once | ORAL | 0 refills | Status: AC

## 2020-04-22 NOTE — Telephone Encounter (Signed)
Patient called and left a voicemail stating that she never received the prep or instructions in the the mail. She then later called and said she wanted to cancel the procedure because she does not have a ride. Called ENDO and canceled the procedure then called patient to inform patient that the procedure is canceled. Patient states she will call back and rescheduled but wants to Prep sent to her pharmacy. Sent prep to pharmacy and patient will go on mychart to received the instructions

## 2020-04-23 ENCOUNTER — Telehealth: Payer: Self-pay

## 2020-04-23 NOTE — Telephone Encounter (Signed)
04/23/20 Spoke with patient about home healthcare. Patient will call her insurance to check coverage. Emailed resources to patient per her request. Ambrose Mantle [phone]

## 2020-04-24 ENCOUNTER — Ambulatory Visit: Admit: 2020-04-24 | Payer: PPO | Admitting: Gastroenterology

## 2020-04-24 SURGERY — COLONOSCOPY WITH PROPOFOL
Anesthesia: General

## 2020-04-25 ENCOUNTER — Ambulatory Visit: Payer: PPO | Admitting: Primary Care

## 2020-04-25 ENCOUNTER — Telehealth: Payer: Self-pay

## 2020-04-25 NOTE — Telephone Encounter (Signed)
Copied from [place] [phone]. Topic: Referral - Status >> Apr 25, 2020  5:18 PM Simone Curia D wrote: [birth year] Follow-up call. Left message on voicemail for patient to return my call regarding resources for home health. Ambrose Mantle [phone]

## 2020-04-26 ENCOUNTER — Other Ambulatory Visit
Admission: RE | Admit: 2020-04-26 | Discharge: 2020-04-26 | Disposition: A | Discharge status: Home / Self Care | Payer: PPO | Source: Ambulatory Visit | Attending: Internal Medicine | Admitting: Internal Medicine

## 2020-04-26 ENCOUNTER — Other Ambulatory Visit: Payer: Self-pay

## 2020-04-26 DIAGNOSIS — Z01812 Encounter for preprocedural laboratory examination: Secondary | ICD-10-CM | POA: Insufficient documentation

## 2020-04-26 DIAGNOSIS — Z20822 Contact with and (suspected) exposure to covid-19: Secondary | ICD-10-CM | POA: Diagnosis not present

## 2020-04-26 LAB — SARS CORONAVIRUS 2 (TAT 6-24 HRS): SARS Coronavirus 2: NEGATIVE

## 2020-04-29 ENCOUNTER — Ambulatory Visit: Payer: PPO | Admitting: Primary Care

## 2020-04-29 ENCOUNTER — Other Ambulatory Visit: Payer: Self-pay

## 2020-04-29 ENCOUNTER — Ambulatory Visit: Payer: PPO | Attending: Internal Medicine

## 2020-04-29 DIAGNOSIS — G4733 Obstructive sleep apnea (adult) (pediatric): Secondary | ICD-10-CM | POA: Diagnosis not present

## 2020-04-29 DIAGNOSIS — J449 Chronic obstructive pulmonary disease, unspecified: Secondary | ICD-10-CM | POA: Insufficient documentation

## 2020-04-29 DIAGNOSIS — R0602 Shortness of breath: Secondary | ICD-10-CM | POA: Diagnosis not present

## 2020-04-30 ENCOUNTER — Encounter: Payer: Self-pay | Admitting: Primary Care

## 2020-04-30 ENCOUNTER — Telehealth: Payer: Self-pay

## 2020-04-30 ENCOUNTER — Ambulatory Visit (INDEPENDENT_AMBULATORY_CARE_PROVIDER_SITE_OTHER): Payer: PPO | Admitting: Primary Care

## 2020-04-30 VITALS — BP 126/76 | HR 80 | Temp 95.5°F | Ht 69.0 in | Wt 241.5 lb

## 2020-04-30 DIAGNOSIS — E538 Deficiency of other specified B group vitamins: Secondary | ICD-10-CM

## 2020-04-30 DIAGNOSIS — M545 Low back pain, unspecified: Secondary | ICD-10-CM

## 2020-04-30 DIAGNOSIS — L409 Psoriasis, unspecified: Secondary | ICD-10-CM

## 2020-04-30 DIAGNOSIS — G8929 Other chronic pain: Secondary | ICD-10-CM | POA: Diagnosis not present

## 2020-04-30 DIAGNOSIS — M2550 Pain in unspecified joint: Secondary | ICD-10-CM | POA: Diagnosis not present

## 2020-04-30 DIAGNOSIS — M2540 Effusion, unspecified joint: Secondary | ICD-10-CM | POA: Diagnosis not present

## 2020-04-30 DIAGNOSIS — E785 Hyperlipidemia, unspecified: Secondary | ICD-10-CM

## 2020-04-30 DIAGNOSIS — R7303 Prediabetes: Secondary | ICD-10-CM

## 2020-04-30 MED ORDER — CYANOCOBALAMIN 1000 MCG/ML IJ SOLN
1000.0000 ug | Freq: Once | INTRAMUSCULAR | Status: AC
  Administered 2020-04-30: 1000 ug via INTRAMUSCULAR

## 2020-04-30 NOTE — Telephone Encounter (Signed)
Copied from [place] [phone]. Topic: Referral - Status >> Apr 30, 2020  3:64 AM Simone Curia D wrote: 6/80/32 Spoke with patient she will call home health resource this week.  No other resources are needed at this time. Closing referral. Ambrose Mantle [phone]

## 2020-04-30 NOTE — Assessment & Plan Note (Signed)
Following with dermatology.  Referral placed rheumatology given long history of joint aches with swelling and psoriasis.

## 2020-04-30 NOTE — Assessment & Plan Note (Signed)
Now following with neurosurgery, physical therapy.  Discussed that I would not provide her with narcotics to treat pain, we were able to successfully wean her and we would prefer to avoid dependence.  Recommended she alternate ibuprofen and Tylenol, continue tizanidine.  I also recommend she touch base with her neurosurgeon to see if there are any other options.

## 2020-04-30 NOTE — Assessment & Plan Note (Signed)
Repeat B12 level pending, injection provided today.

## 2020-04-30 NOTE — Assessment & Plan Note (Signed)
Compliant to Repatha injections, repeat lipid panel pending.

## 2020-04-30 NOTE — Assessment & Plan Note (Signed)
Checking labs to rule out RA and gout. Referral placed to rheumatology.

## 2020-04-30 NOTE — Assessment & Plan Note (Signed)
Repeat A1c pending.  Strongly advise she work on her diet, try some form of exercise.

## 2020-04-30 NOTE — Patient Instructions (Addendum)
Stop by the lab prior to leaving today. I will notify you of your results once received.   You will be contacted regarding your referral to rheumatology.  Please let us know if you have not been contacted within two weeks.   Alternate Tylenol 500 mg with Advil 800 mg. Do not exceed 3000 mg of Tylenol and 2400 mg of Advil in 24 hours.  It was a pleasure to see you today!

## 2020-04-30 NOTE — Progress Notes (Signed)
Subjective:    Patient ID: Emily Moon, female    DOB: May 04, 1961, 59 y.o.   MRN: [id number]  HPI  This visit occurred during the SARS-CoV-2 public health emergency.  Safety protocols were in place, including screening questions prior to the visit, additional usage of staff PPE, and extensive cleaning of exam room while observing appropriate contact time as indicated for disinfecting solutions.   Emily Moon is a 59 year old female with a significant medical history including hyperlipidemia, GERD, multiple sclerosis, chronic joint pain, osteoarthritis, bipolar disorder, asthma, OSA, renal stones who presents today to discuss joint aches and recurrent rashes.  She is also due for her B12 injection.  She is requesting to see rheumatology given chronic joint pain and recurrent rashes. She's seen dermatology several times for recurrent rashes, has been treated with numerous regimens without resolve. She is wanting to know if the joint aches and recurrent rashes are related.   She is no longer following with pain management, did not feel as though they wanted to help her with her pain.  She is currently following with neurosurgery and is undergoing neural reset therapy once weekly.  Physical therapy is helping, especially in regards to her neck pain and range of motion.  She continues to struggle with lower back pain, may undergo injections to the lumbar spine if indicated.    She is wanting a refill of her hydrocodone, enough to last her for the next 2 weeks given her level of pain. She is currently taking Advil 800 mg every four hours without improvement during the day. She is taking Tizanidine during the evening, with some improvement. She's found no improvement with Toradol.   She is wanting a referral to rheumatology for the chronic joint pain and swelling to her elbows and knees specifically, also with erythema. She has recently been diagnosed with plaque psoriasis.   BP Readings from Last  3 Encounters:  04/30/20 126/76  04/19/20 (!) 172/87  04/17/20 132/82     Review of Systems  Musculoskeletal: Positive for arthralgias, back pain and joint swelling.  Skin: Positive for rash.  Neurological: Negative for weakness.       Past Medical History:  Diagnosis Date  . Arthritis    osteo  . Asthma   . Bipolar disorder ([place]) 05/21/14  . Cataracts, bilateral   . DDD (degenerative disc disease), cervical    also back  . Depression   . Dizziness    Positional  . Edema    feet/legs  . Fibromyalgia syndrome   . Fungal infection    Finger nails  . GERD (gastroesophageal reflux disease)   . Gout   . Headache    seasonal allergies  . Heart palpitations   . 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 Hypercholesterolemia   . Multiple sclerosis (HCC)    weakness  . Nephrolithiasis    kidney stones  . Osteoporosis    osteoarthritis  . Pneumonia   . PONV (postoperative nausea and vomiting)    no problem after cataract surgery  . Psoriasis   . Renal stone   . Shortness of breath dyspnea    wheezing  . Sleep apnea 2012   sleep study / slight, no interventions  . Urinary frequency      Social History   Socioeconomic History  . Marital status: Divorced    Spouse name: Not on file  . Number of children: 1  . Years of education: Not on file  . Highest  education level: Not on file  Occupational History  . 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 Smoking status: Former Smoker    Years: 25.00    Types: Cigarettes    Quit date: 11/16/2012    Years since quitting: 7.4  . Smokeless tobacco: Never Used  . Tobacco comment: occasional use  Vaping Use  . Vaping Use: Former  Substance and Sexual Activity  . Alcohol use: No    Alcohol/week: 0.0 standard drinks  . Drug use: No    Types: Methylphenidate  . Sexual activity: Never  Other Topics Concern  . Not on file  Social History Narrative  . Not on file   Social  Determinants of Health   Financial Resource Strain: Low Risk   . Difficulty of Paying Living Expenses: Not hard at all  Food Insecurity: No Food Insecurity  . Worried About Charity fundraiser in the Last Year: Never true  . Ran Out of Food in the Last Year: Never true  Transportation Needs: No Transportation Needs  . Lack of Transportation (Medical): No  . Lack of Transportation (Non-Medical): No  Physical Activity: Inactive  . Days of Exercise per Week: 0 days  . Minutes of Exercise per Session: 0 min  Stress: Stress Concern Present  . Feeling of Stress : Very much  Social Connections:   . Frequency of Communication with Friends and Family:   . Frequency of Social Gatherings with Friends and Family:   . Attends Religious Services:   . Active Member of Clubs or Organizations:   . Attends Archivist Meetings:   Marland Kitchen Marital Status:   Intimate Partner Violence: Not At Risk  . Fear of Current or Ex-Partner: No  . Emotionally Abused: No  . Physically Abused: No  . Sexually Abused: No    Past Surgical History:  Procedure Laterality Date  . CATARACT EXTRACTION W/PHACO Left 05/21/2015   Procedure: CATARACT EXTRACTION PHACO AND INTRAOCULAR LENS PLACEMENT (IOC);  Surgeon: Birder Robson, MD;  Location: [facility];  Service: Ophthalmology;  Laterality: Left;  [place] 00:35 AP% 22.9 CDE 8.11 fluid pack lot #[id number]  . CATARACT EXTRACTION W/PHACO Right 06/04/2015   Procedure: CATARACT EXTRACTION PHACO AND INTRAOCULAR LENS PLACEMENT (IOC);  Surgeon: Birder Robson, MD;  Location: [facility];  Service: Ophthalmology;  Laterality: Right;  US:00:48 AP%: 10.5 CDE:5.08 Fluid lot #[id number]  . CYSTOSCOPY/URETEROSCOPY/HOLMIUM LASER/STENT PLACEMENT Bilateral 09/22/2016   Procedure: CYSTOSCOPY/URETEROSCOPY/HOLMIUM LASER/STENT PLACEMENT;  Surgeon: Hollice Espy, MD;  Location: [facility];  Service: Urology;  Laterality: Bilateral;  . EYE SURGERY  2015   tissue biopsy  . FOOT SURGERY  2015  .  JOINT REPLACEMENT Left 2013   hip replacement  . LITHOTRIPSY    . PTOSIS REPAIR Bilateral 02/18/2016   Procedure: BILATERAL PTOSIS REPAIR UPPER EYELIDS;  Surgeon: Karle Starch, MD;  Location: [facility];  Service: Ophthalmology;  Laterality: Bilateral;  LEAVE PT EARLY AM  . thumb surgery Right   . TONSILLECTOMY  1973    Family History  Problem Relation Age of Onset  . Cancer Father        Abdomen with mastasis  . Cancer Mother   . Heart disease Mother   . Kidney disease Neg Hx   . Bladder Cancer Neg Hx   . Prostate cancer Neg Hx   . Kidney cancer Neg Hx     Allergies  Allergen Reactions  . Albuterol Shortness Of Breath and  Other (See Comments)    Makes pt feel jittery/ tacycardic  . Crestor [Rosuvastatin] Other (See Comments)    Joint pain, muscle pain, and hair loss  . Halcion [Triazolam] Other (See Comments)    Dizziness,headaches,bladder problems  . Levaquin [Levofloxacin In D5w] Diarrhea and Itching    Shoulder pain  . Naproxen Sodium Swelling    Patient tolerates in small doses  . Tylenol [Acetaminophen] Swelling    Patient tolerates in small doses  . Cefaclor Other (See Comments)    Doesn't remember---unsure if actually allergic   . Diclofenac Sodium Other (See Comments)    "made very sick"  . Sulfa Antibiotics Itching    Unsure of reaction possibly itching Unsure of reaction possibly itching  . Tramadol Itching and Nausea And Vomiting  . Aripiprazole Other (See Comments)    Muscle tension/cramping  . Diclofenac Sodium Rash    "made very sick"  . Ibuprofen Swelling    Patient tolerates in small doses    Current Outpatient Medications on File Prior to Visit  Medication Sig Dispense Refill  . ASPERCREME LIDOCAINE EX Apply 1 application topically 4 (four) times daily as needed (for pain.).     Marland Kitchen aspirin 325 MG tablet Take 325 mg by mouth every 4 (four) hours as needed for headache.     . Azelastine HCl 137 MCG/SPRAY SOLN PLACE 2 SPRAYS INTO BOTH  NOSTRILS 2 (TWO) TIMES DAILY 30 mL 1  . azithromycin (ZITHROMAX) 250 MG tablet Take 250 mg by mouth as directed.    Marland Kitchen BIOTIN PO Take 200 mg by mouth daily.     . cetirizine (ZYRTEC) 10 MG tablet Take 10 mg by mouth 2 (two) times daily.    . Cholecalciferol (VITAMIN D3) 10000 units capsule Take 30,000 Units by mouth daily.     . ciclopirox (LOPROX) 0.77 % cream Apply topically 2 (two) times daily. 30 g 0  . clobetasol (TEMOVATE) 0.05 % external solution Mix clobetasol solution with  CeraVe cream Use twice daily to affected areas.Avoid Face, groin and underarm 50 mL 0  . Continuous Blood Gluc Receiver (FREESTYLE LIBRE 2 READER) DEVI Use as instruct to test blood sugar daily 2 each 1  . Continuous Blood Gluc Receiver (FREESTYLE LIBRE READER) DEVI Use receiver daily to check blood sugar 1 each 0  . Continuous Blood Gluc Sensor (FREESTYLE LIBRE 14 DAY SENSOR) MISC PLACE INTO SKIN FOR CONTINUOUS CHECK OF BLOOD SUGAR DAILY 2 each 2  . Continuous Blood Gluc Sensor (FREESTYLE LIBRE 2 SENSOR) MISC Use as instruct to test blood sugar daily 1 each 0  . cyanocobalamin (,VITAMIN B-12,) 1000 MCG/ML injection Inject 1 mL (1,000 mcg total) into the muscle every 3 (three) months. 1 mL 0  . desipramine (NORPRAMIN) 25 MG tablet TAKE 1 TABLET BY MOUTH IN THE MORNING 90 tablet 1  . diclofenac sodium (VOLTAREN) 1 % GEL Apply 4 g topically 4 (four) times daily. 500 g 5  . docusate sodium (COLACE) 100 MG capsule Take 1 capsule (100 mg total) by mouth 2 (two) times daily. 60 capsule 0  . doxycycline (PERIOSTAT) 20 MG tablet Take 20 mg by mouth 2 (two) times daily.    . DULoxetine (CYMBALTA) 20 MG capsule Take 2 capsules (40 mg total) by mouth daily. 60 capsule 5  . Evolocumab (REPATHA SURECLICK) 144 MG/ML SOAJ Inject 1 Dose into the skin every 14 (fourteen) days. 2 pen 11  . famotidine (PEPCID) 20 MG tablet Take 1 tablet (20 mg total) by  mouth at bedtime. 30 tablet 2  . fluocinonide (LIDEX) 0.05 % external solution Apply 1  application topically 2 (two) times daily as needed. Apply to scalp twice daily as need for itch 180 mL 3  . hydrocortisone 2.5 % cream Apply BID to affected area in underarms and groin prn flares    . hydroquinone 4 % cream 1 APPLICATION TOPICALLY DAILY AS NEEDED FOR BLEMISHES.  3  . hydrOXYzine (VISTARIL) 50 MG capsule TAKE 1 CAPSLE BY MOUTH AT NOON, 1 AT 6 PM, AND 1 AS NEEDED 270 capsule 2  . lamoTRIgine (LAMICTAL) 150 MG tablet TAKE 2 TABLETS BY MOUTH AT BEDTIME 180 tablet 1  . levalbuterol (XOPENEX HFA) 45 MCG/ACT inhaler Inhale 1-2 puffs into the lungs every 6 (six) hours as needed for wheezing. 1 Inhaler 0  . levalbuterol (XOPENEX) 1.25 MG/3ML nebulizer solution USE 1 VIAL VIA NEBULIZER EVERY 4 HOURS AS NEEDED FOR WHEEZING OR SHORTNESS OF BREATH 270 mL 1  . Lysine 1000 MG TABS Take 2,000-4,000 mg by mouth at bedtime. 2000 mg scheduled at bedtime and patient will take 4000 mg if she has outbreak    . Magnesium 500 MG TABS Take 1 tablet by mouth at bedtime.    Marland Kitchen neomycin-bacitracin-polymyxin (NEOSPORIN) [phone] ointment Apply 1 application topically 4 (four) times daily as needed (for cut/scrapes.).    Marland Kitchen pantoprazole (PROTONIX) 40 MG tablet Take 1 tablet (40 mg total) by mouth daily. 90 tablet 1  . phenazopyridine (AZO-TABS) 95 MG tablet Take 95 mg by mouth 3 (three) times daily as needed for pain.    . polyethylene glycol powder (GLYCOLAX/MIRALAX) powder MIX 17 GRAMS (1 CAPFUL) WITH 4-8 OZ OF LIQUID AND TAKE BY MOUTH TWICE DAILY AS NEEDED 527 g 0  . temazepam (RESTORIL) 30 MG capsule TAKE ONE TO TWO CAPSULES BY MOUTH NIGHTLY AT BEDTIME 60 capsule 4  . tiZANidine (ZANAFLEX) 4 MG tablet TAKE 2 TABLETS (8 MG TOTAL) BY MOUTH EVERY 6 (SIX) HOURS AS NEEDED FOR MUSCLE SPASMS. 240 tablet 0  . traZODone (DESYREL) 50 MG tablet Take 2 tablets (100 mg total) by mouth at bedtime. 60 tablet 7   No current facility-administered medications on file prior to visit.    BP 126/76   Pulse 80   Temp (!)  95.5 F (35.3 C) (Temporal)   Ht 5\' 9"  (1.753 m)   Wt 241 lb 8 oz (109.5 kg)   LMP 08/23/2014   SpO2 98%   BMI 35.66 kg/m    Objective:   Physical Exam  Cardiovascular: Normal rate and regular rhythm.  Respiratory: Effort normal and breath sounds normal.  Musculoskeletal:        General: No swelling.  Neurological: She is alert.  Skin: Skin is warm and dry.  Evidence of plaque psoriasis to left posterior thigh.  No widespread rash to upper extremities noted.           Assessment & Plan:

## 2020-05-01 DIAGNOSIS — E119 Type 2 diabetes mellitus without complications: Secondary | ICD-10-CM

## 2020-05-01 LAB — CYCLIC CITRUL PEPTIDE ANTIBODY, IGG: Cyclic Citrullin Peptide Ab: <16 UNITS

## 2020-05-01 LAB — RHEUMATOID FACTOR: Rheumatoid fact SerPl-aCnc: <14 IU/mL (ref <14)

## 2020-05-01 LAB — HEMOGLOBIN A1C: Hgb A1c MFr Bld: 6.9 % — ABNORMAL HIGH (ref 4.6–6.5)

## 2020-05-01 LAB — LIPID PANEL
Cholesterol: 225 mg/dL — ABNORMAL HIGH (ref 0–200)
HDL: 64.9 mg/dL (ref >39.00)
NonHDL: 159.71
Total CHOL/HDL Ratio: 3
Triglycerides: 205 mg/dL — ABNORMAL HIGH (ref 0.0–149.0)
VLDL: 41 mg/dL — ABNORMAL HIGH (ref 0.0–40.0)

## 2020-05-01 LAB — C-REACTIVE PROTEIN: CRP: 1.3 mg/dL (ref 0.5–20.0)

## 2020-05-01 LAB — SEDIMENTATION RATE: Sed Rate: 15 mm/hr (ref 0–30)

## 2020-05-01 LAB — URIC ACID: Uric Acid, Serum: 4.7 mg/dL (ref 2.4–7.0)

## 2020-05-01 LAB — LDL CHOLESTEROL, DIRECT: Direct LDL: 139 mg/dL

## 2020-05-01 LAB — VITAMIN B12: Vitamin B-12: 189 pg/mL — ABNORMAL LOW (ref 211–911)

## 2020-05-01 NOTE — Telephone Encounter (Signed)
Dr. Kasa please advise. Thanks 

## 2020-05-02 ENCOUNTER — Telehealth: Payer: Self-pay | Admitting: Internal Medicine

## 2020-05-02 MED ORDER — METFORMIN HCL ER 500 MG PO TB24: 500.0000 mg | ORAL_TABLET | Freq: Every day | ORAL | 1 refills | Status: DC

## 2020-05-02 NOTE — Telephone Encounter (Signed)
Pt has been scheduled for virtual visit with TP on 05/09/2020 at 2:00p. Nothing further is needed.

## 2020-05-03 DIAGNOSIS — M5441 Lumbago with sciatica, right side: Secondary | ICD-10-CM | POA: Diagnosis not present

## 2020-05-03 DIAGNOSIS — M5442 Lumbago with sciatica, left side: Secondary | ICD-10-CM | POA: Diagnosis not present

## 2020-05-03 DIAGNOSIS — G8929 Other chronic pain: Secondary | ICD-10-CM | POA: Diagnosis not present

## 2020-05-07 ENCOUNTER — Telehealth: Payer: Self-pay | Admitting: Internal Medicine

## 2020-05-07 NOTE — Telephone Encounter (Signed)
PA for repatha re-authorization submitted via [place] (Key: [id number]) - [id number]

## 2020-05-09 ENCOUNTER — Telehealth (INDEPENDENT_AMBULATORY_CARE_PROVIDER_SITE_OTHER): Payer: PPO | Admitting: Adult Health

## 2020-05-09 ENCOUNTER — Encounter: Payer: Self-pay | Admitting: Adult Health

## 2020-05-09 DIAGNOSIS — G4733 Obstructive sleep apnea (adult) (pediatric): Secondary | ICD-10-CM

## 2020-05-09 DIAGNOSIS — R000 Tachycardia, unspecified: Secondary | ICD-10-CM | POA: Diagnosis not present

## 2020-05-09 DIAGNOSIS — J449 Chronic obstructive pulmonary disease, unspecified: Secondary | ICD-10-CM

## 2020-05-09 MED ORDER — BREO ELLIPTA 100-25 MCG/INH IN AEPB: 1.0000 | INHALATION_SPRAY | Freq: Every day | RESPIRATORY_TRACT | 5 refills | Status: DC

## 2020-05-09 NOTE — Telephone Encounter (Signed)
Medication has been approved until 05/07/2021

## 2020-05-09 NOTE — Progress Notes (Signed)
Virtual Visit via Video Note  I connected with Emily Moon on 05/09/20 at  2:00 PM EDT by a video enabled telemedicine application and verified that I am speaking with the correct person using two identifiers.  Location: Patient: Home  Provider: Home Office    I discussed the limitations of evaluation and management by telemedicine and the availability of in person appointments. The patient expressed understanding and agreed to proceed.  History of Present Illness: 59 year old female former smoker followed for obstructive sleep apnea Medical history significant for multiple comorbidities including multiple sclerosis, hyperlipidemia, bipolar disorder  Today's televisit is a 2-week follow-up for obstructive sleep apnea and dyspnea.  Patient has severe obstructive sleep apnea with previous sleep study showing AHI of 35.  Patient has been tried on CPAP in the past but was intolerant.  And turned her mask and machine back into the homecare company.  She was seen last visit complaining of daytime sleepiness and wanting to restart her CPAP.  We discussed her options.  With the severity of her sleep apnea and ongoing symptoms it seems appropriate to go ahead and send her for a CPAP titration study.  Patient is acceptable to this.  Patient has a history of smoking.  She is having some ongoing shortness of breath.  Has Xopenex inhaler to use as needed.  Patient was set up for pulmonary function testing that was done April 29, 2020 that showed moderate restriction and airflow obstruction with FEV1 at 63%, ratio 72, FVC 68%, positive bronchodilator response.  DLCO 116%.  Chest x-ray in April was clear. We went over her test results and discussed adding an an inhaler.  Patient has multiple drug intolerances.  Patient also complains that she has had ongoing tachycardia for the last year.  And would like a referral to cardiology.  She has seen cardiology in the past.  Patient denies any syncope presyncope  or current chest pain.   Patient Active Problem List   Diagnosis Date Noted  . Marijuana use 11/28/2019  . Psoriasis 11/02/2019  . Rash and nonspecific skin eruption 02/20/2019  . Asthma 02/10/2019  . Epigastric pain 12/21/2018  . OSA (obstructive sleep apnea) 12/02/2018  . Joint swelling 10/24/2018  . Environmental and seasonal allergies 10/24/2018  . Greater trochanteric bursitis of right hip 09/28/2018  . Prediabetes 07/22/2018  . Bipolar I disorder, most recent episode (or current) manic ([place]) 05/20/2018  . Vitamin D deficiency 02/15/2018  . Preventative health care 11/24/2017  . Urinary frequency 03/30/2017  . B12 deficiency 05/22/2016  . Dizziness 03/16/2016  . Medicare annual wellness visit, subsequent 10/25/2015  . Chronic back pain 10/31/2014  . Weight gain 06/21/2014  . Bipolar disorder ([place]) 05/21/2014  . 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 Insomnia 09/01/2011  . Obesity 05/04/2011  . HLD (hyperlipidemia) 01/20/2011  . Tobacco abuse 01/20/2011  . Chest pain 01/20/2011  . Chronic pain syndrome 01/07/2011  . RENAL CALCULUS, RECURRENT 11/13/2010  . MULTIPLE SCLEROSIS, PROGRESSIVE/RELAPSING 08/28/2010  . GERD 08/28/2010  . Osteoarthritis 08/28/2010  . NEPHROLITHIASIS, HX OF 08/28/2010   Current Outpatient Medications on File Prior to Visit  Medication Sig Dispense Refill  . ASPERCREME LIDOCAINE EX Apply 1 application topically 4 (four) times daily as needed (for pain.).     Marland Kitchen aspirin 325 MG tablet Take 325 mg by mouth every 4 (four) hours as needed for headache.     Marland Kitchen  Azelastine HCl 137 MCG/SPRAY SOLN PLACE 2 SPRAYS INTO BOTH NOSTRILS 2 (TWO) TIMES DAILY 30 mL 1  . azithromycin (ZITHROMAX) 250 MG tablet Take 250 mg by mouth as directed.    Marland Kitchen BIOTIN PO Take 200 mg by mouth daily.     . cetirizine (ZYRTEC) 10 MG tablet Take 10 mg by mouth 2 (two) times daily.     . Cholecalciferol (VITAMIN D3) 10000 units capsule Take 30,000 Units by mouth daily.     . ciclopirox (LOPROX) 0.77 % cream Apply topically 2 (two) times daily. 30 g 0  . clobetasol (TEMOVATE) 0.05 % external solution Mix clobetasol solution with  CeraVe cream Use twice daily to affected areas.Avoid Face, groin and underarm 50 mL 0  . Continuous Blood Gluc Receiver (FREESTYLE LIBRE 2 READER) DEVI Use as instruct to test blood sugar daily 2 each 1  . Continuous Blood Gluc Receiver (FREESTYLE LIBRE READER) DEVI Use receiver daily to check blood sugar 1 each 0  . Continuous Blood Gluc Sensor (FREESTYLE LIBRE 14 DAY SENSOR) MISC PLACE INTO SKIN FOR CONTINUOUS CHECK OF BLOOD SUGAR DAILY 2 each 2  . Continuous Blood Gluc Sensor (FREESTYLE LIBRE 2 SENSOR) MISC Use as instruct to test blood sugar daily 1 each 0  . cyanocobalamin (,VITAMIN B-12,) 1000 MCG/ML injection Inject 1 mL (1,000 mcg total) into the muscle every 3 (three) months. 1 mL 0  . desipramine (NORPRAMIN) 25 MG tablet TAKE 1 TABLET BY MOUTH IN THE MORNING 90 tablet 1  . diclofenac sodium (VOLTAREN) 1 % GEL Apply 4 g topically 4 (four) times daily. 500 g 5  . docusate sodium (COLACE) 100 MG capsule Take 1 capsule (100 mg total) by mouth 2 (two) times daily. 60 capsule 0  . doxycycline (PERIOSTAT) 20 MG tablet Take 20 mg by mouth 2 (two) times daily.    . DULoxetine (CYMBALTA) 20 MG capsule Take 2 capsules (40 mg total) by mouth daily. 60 capsule 5  . Evolocumab (REPATHA SURECLICK) 454 MG/ML SOAJ Inject 1 Dose into the skin every 14 (fourteen) days. 2 pen 11  . famotidine (PEPCID) 20 MG tablet Take 1 tablet (20 mg total) by mouth at bedtime. 30 tablet 2  . fluocinonide (LIDEX) 0.05 % external solution Apply 1 application topically 2 (two) times daily as needed. Apply to scalp twice daily as need for itch 180 mL 3  . hydrocortisone 2.5 % cream Apply BID to affected area in underarms and groin prn flares    . hydroquinone 4 % cream 1 APPLICATION  TOPICALLY DAILY AS NEEDED FOR BLEMISHES.  3  . hydrOXYzine (VISTARIL) 50 MG capsule TAKE 1 CAPSLE BY MOUTH AT NOON, 1 AT 6 PM, AND 1 AS NEEDED 270 capsule 2  . lamoTRIgine (LAMICTAL) 150 MG tablet TAKE 2 TABLETS BY MOUTH AT BEDTIME 180 tablet 1  . levalbuterol (XOPENEX HFA) 45 MCG/ACT inhaler Inhale 1-2 puffs into the lungs every 6 (six) hours as needed for wheezing. 1 Inhaler 0  . levalbuterol (XOPENEX) 1.25 MG/3ML nebulizer solution USE 1 VIAL VIA NEBULIZER EVERY 4 HOURS AS NEEDED FOR WHEEZING OR SHORTNESS OF BREATH 270 mL 1  . Lysine 1000 MG TABS Take 2,000-4,000 mg by mouth at bedtime. 2000 mg scheduled at bedtime and patient will take 4000 mg if she has outbreak    . Magnesium 500 MG TABS Take 1 tablet by mouth at bedtime.    . metFORMIN (GLUCOPHAGE-XR) 500 MG 24 hr tablet Take 1 tablet (500 mg total) by mouth  daily with breakfast. For diabetes. 90 tablet 1  . neomycin-bacitracin-polymyxin (NEOSPORIN) [phone] ointment Apply 1 application topically 4 (four) times daily as needed (for cut/scrapes.).    Marland Kitchen pantoprazole (PROTONIX) 40 MG tablet Take 1 tablet (40 mg total) by mouth daily. 90 tablet 1  . phenazopyridine (AZO-TABS) 95 MG tablet Take 95 mg by mouth 3 (three) times daily as needed for pain.    . polyethylene glycol powder (GLYCOLAX/MIRALAX) powder MIX 17 GRAMS (1 CAPFUL) WITH 4-8 OZ OF LIQUID AND TAKE BY MOUTH TWICE DAILY AS NEEDED 527 g 0  . temazepam (RESTORIL) 30 MG capsule TAKE ONE TO TWO CAPSULES BY MOUTH NIGHTLY AT BEDTIME 60 capsule 4  . tiZANidine (ZANAFLEX) 4 MG tablet TAKE 2 TABLETS (8 MG TOTAL) BY MOUTH EVERY 6 (SIX) HOURS AS NEEDED FOR MUSCLE SPASMS. 240 tablet 0  . traZODone (DESYREL) 50 MG tablet Take 2 tablets (100 mg total) by mouth at bedtime. 60 tablet 7   No current facility-administered medications on file prior to visit.    Observations/Objective: Speaks in full sentences.  She does not appear in any apparent distress.  Assessment and Plan: Severe obstructive  sleep apnea.  Unfortunately patient was intolerant to her CPAP initially.  Patient will need a in lab CPAP titration study if CPAP, BiPAP titration is okay  Morbid obesity weight loss is encouraged  COPD/asthma-patient has moderate restriction and probable obstruction on PFTs.  She had significant bronchodilator response.  We will give an empiric trial of Breo.  Tachycardia questionable etiology.  Recent TSH was normal.  Will refer to cardiology as per patient request  Plan  Patient Instructions  Begin BREO 1 puff daily, rinse after use.  Set up for CPAP titration study - please indicate that if CPAP does not control, transition to BIPAP .  Refer to Cardiology for tachycardia .  Follow up with Dr. Mortimer Fries in 2-3 months and As needed   Please contact office for sooner follow up if symptoms do not improve or worsen or seek emergency care       Follow Up Instructions: Follow-up in 2 to 3 months and as needed   I discussed the assessment and treatment plan with the patient. The patient was provided an opportunity to ask questions and all were answered. The patient agreed with the plan and demonstrated an understanding of the instructions.   The patient was advised to call back or seek an in-person evaluation if the symptoms worsen or if the condition fails to improve as anticipated.  I provided 22  minutes of non-face-to-face time during this encounter.   Rexene Edison, NP

## 2020-05-09 NOTE — Patient Instructions (Addendum)
Begin BREO 1 puff daily, rinse after use.  Set up for CPAP titration study - please indicate that if CPAP does not control, transition to BIPAP .  Refer to Cardiology for tachycardia .  Follow up with Dr. Mortimer Fries in 2-3 months and As needed   Please contact office for sooner follow up if symptoms do not improve or worsen or seek emergency care

## 2020-05-10 ENCOUNTER — Other Ambulatory Visit: Payer: Self-pay | Admitting: Adult Health

## 2020-05-10 DIAGNOSIS — R000 Tachycardia, unspecified: Secondary | ICD-10-CM

## 2020-05-10 DIAGNOSIS — G4733 Obstructive sleep apnea (adult) (pediatric): Secondary | ICD-10-CM

## 2020-05-16 DIAGNOSIS — M5442 Lumbago with sciatica, left side: Secondary | ICD-10-CM | POA: Diagnosis not present

## 2020-05-16 DIAGNOSIS — M5441 Lumbago with sciatica, right side: Secondary | ICD-10-CM | POA: Diagnosis not present

## 2020-05-17 ENCOUNTER — Encounter: Payer: Self-pay | Admitting: Cardiology

## 2020-05-17 ENCOUNTER — Other Ambulatory Visit: Payer: Self-pay

## 2020-05-17 ENCOUNTER — Ambulatory Visit: Payer: PPO | Admitting: Cardiology

## 2020-05-17 VITALS — BP 124/82 | HR 105 | Ht 69.0 in | Wt 239.5 lb

## 2020-05-17 DIAGNOSIS — E7800 Pure hypercholesterolemia, unspecified: Secondary | ICD-10-CM

## 2020-05-17 DIAGNOSIS — Z6835 Body mass index (BMI) 35.0-35.9, adult: Secondary | ICD-10-CM

## 2020-05-17 DIAGNOSIS — R000 Tachycardia, unspecified: Secondary | ICD-10-CM

## 2020-05-17 MED ORDER — METOPROLOL SUCCINATE ER 50 MG PO TB24: 25.0000 mg | ORAL_TABLET | Freq: Every day | ORAL | 6 refills | Status: DC

## 2020-05-17 NOTE — Progress Notes (Signed)
Cardiology Office Note:    Date:  05/17/2020   ID:  KERYL GHOLSON, DOB Mar 22, 1961, MRN [id number]  PCP:  [name], NP  [facility] Cardiologist:  [name] Sable, MD  [place] Electrophysiologist:  None   Referring MD: [name], NP   Chief Complaint  Patient presents with  . New Patient (Initial Visit)    Pt has on going tachycardia. Meds verbally reviewed w/ pt.    History of Present Illness:    Emily Moon is a 59 y.o. female with a hx of asthma, bipolar, OSA, diabetes, hyperlipidemia, GERD, former smoker x15 years who presents due to tachycardia.  Patient states having elevated heart rates over the past 6 months.  She has a smart watch which checks her heart rates and usually around 112 bpm at rest.  She states having occasional palpitations.  She denies any history of heart disease or heart attacks.  She has obstructive pulmonary disease and is seeing pulmonary medicine for this.  She used to follow-up in [place] at the hyperlipidemia clinic where Repatha was started.  She has allergies to statins.  She wants to establish care in [place] due to better commute.  Past Medical History:  Diagnosis Date  . Arthritis    osteo  . Asthma   . Bipolar disorder ([place]) 05/21/14  . Cataracts, bilateral   . DDD (degenerative disc disease), cervical    also back  . Depression   . Dizziness    Positional  . Edema    feet/legs  . Fibromyalgia syndrome   . Fungal infection    Finger nails  . GERD (gastroesophageal reflux disease)   . Gout   . Headache    seasonal allergies  . Heart palpitations   . 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 Hypercholesterolemia   . Multiple sclerosis (HCC)    weakness  . Nephrolithiasis    kidney stones  . Osteoporosis    osteoarthritis  . Pneumonia   . PONV (postoperative nausea and vomiting)    no problem after cataract surgery  . Psoriasis   . Renal stone   . Shortness of breath dyspnea     wheezing  . Sleep apnea 2012   sleep study / slight, no interventions  . Urinary frequency     Past Surgical History:  Procedure Laterality Date  . CATARACT EXTRACTION W/PHACO Left 05/21/2015   Procedure: CATARACT EXTRACTION PHACO AND INTRAOCULAR LENS PLACEMENT (IOC);  Surgeon: Birder Robson, MD;  Location: [facility];  Service: Ophthalmology;  Laterality: Left;  [place] 00:35 AP% 22.9 CDE 8.11 fluid pack lot #[id number]  . CATARACT EXTRACTION W/PHACO Right 06/04/2015   Procedure: CATARACT EXTRACTION PHACO AND INTRAOCULAR LENS PLACEMENT (IOC);  Surgeon: Birder Robson, MD;  Location: [facility];  Service: Ophthalmology;  Laterality: Right;  US:00:48 AP%: 10.5 CDE:5.08 Fluid lot #[id number]  . CYSTOSCOPY/URETEROSCOPY/HOLMIUM LASER/STENT PLACEMENT Bilateral 09/22/2016   Procedure: CYSTOSCOPY/URETEROSCOPY/HOLMIUM LASER/STENT PLACEMENT;  Surgeon: Hollice Espy, MD;  Location: [facility];  Service: Urology;  Laterality: Bilateral;  . EYE SURGERY  2015   tissue biopsy  . FOOT SURGERY  2015  . JOINT REPLACEMENT Left 2013   hip replacement  . LITHOTRIPSY    . PTOSIS REPAIR Bilateral 02/18/2016   Procedure: BILATERAL PTOSIS REPAIR UPPER EYELIDS;  Surgeon: Karle Starch, MD;  Location: [facility];  Service: Ophthalmology;  Laterality: Bilateral;  LEAVE PT EARLY AM  . thumb surgery Right   . TONSILLECTOMY  1973    Current Medications:  Current Meds  Medication Sig  . ASPERCREME LIDOCAINE EX Apply 1 application topically 4 (four) times daily as needed (for pain.).   Marland Kitchen aspirin 325 MG tablet Take 325 mg by mouth every 4 (four) hours as needed for headache.   . Azelastine HCl 137 MCG/SPRAY SOLN PLACE 2 SPRAYS INTO BOTH NOSTRILS 2 (TWO) TIMES DAILY  . azithromycin (ZITHROMAX) 250 MG tablet Take 250 mg by mouth as directed.  Marland Kitchen BIOTIN PO Take 200 mg by mouth daily.   . cetirizine (ZYRTEC) 10 MG tablet Take 10 mg by mouth 2 (two) times daily.  . Cholecalciferol (VITAMIN D3) 10000 units capsule Take  30,000 Units by mouth daily.   . ciclopirox (LOPROX) 0.77 % cream Apply topically 2 (two) times daily.  . clobetasol (TEMOVATE) 0.05 % external solution Mix clobetasol solution with  CeraVe cream Use twice daily to affected areas.Avoid Face, groin and underarm  . Continuous Blood Gluc Receiver (FREESTYLE LIBRE 2 READER) DEVI Use as instruct to test blood sugar daily  . Continuous Blood Gluc Receiver (FREESTYLE LIBRE READER) DEVI Use receiver daily to check blood sugar  . Continuous Blood Gluc Sensor (FREESTYLE LIBRE 14 DAY SENSOR) MISC PLACE INTO SKIN FOR CONTINUOUS CHECK OF BLOOD SUGAR DAILY  . Continuous Blood Gluc Sensor (FREESTYLE LIBRE 2 SENSOR) MISC Use as instruct to test blood sugar daily  . cyanocobalamin (,VITAMIN B-12,) 1000 MCG/ML injection Inject 1 mL (1,000 mcg total) into the muscle every 3 (three) months.  . desipramine (NORPRAMIN) 25 MG tablet TAKE 1 TABLET BY MOUTH IN THE MORNING  . diclofenac sodium (VOLTAREN) 1 % GEL Apply 4 g topically 4 (four) times daily.  Marland Kitchen docusate sodium (COLACE) 100 MG capsule Take 1 capsule (100 mg total) by mouth 2 (two) times daily.  Marland Kitchen doxycycline (PERIOSTAT) 20 MG tablet Take 20 mg by mouth 2 (two) times daily.  . DULoxetine (CYMBALTA) 20 MG capsule Take 2 capsules (40 mg total) by mouth daily.  . Evolocumab (REPATHA SURECLICK) 643 MG/ML SOAJ Inject 1 Dose into the skin every 14 (fourteen) days.  . famotidine (PEPCID) 20 MG tablet Take 1 tablet (20 mg total) by mouth at bedtime.  . fluocinonide (LIDEX) 0.05 % external solution Apply 1 application topically 2 (two) times daily as needed. Apply to scalp twice daily as need for itch  . fluticasone furoate-vilanterol (BREO ELLIPTA) 100-25 MCG/INH AEPB Inhale 1 puff into the lungs daily.  . hydrocortisone 2.5 % cream Apply BID to affected area in underarms and groin prn flares  . hydroquinone 4 % cream 1 APPLICATION TOPICALLY DAILY AS NEEDED FOR BLEMISHES.  . hydrOXYzine (VISTARIL) 50 MG capsule TAKE 1  CAPSLE BY MOUTH AT NOON, 1 AT 6 PM, AND 1 AS NEEDED  . lamoTRIgine (LAMICTAL) 150 MG tablet TAKE 2 TABLETS BY MOUTH AT BEDTIME  . levalbuterol (XOPENEX HFA) 45 MCG/ACT inhaler Inhale 1-2 puffs into the lungs every 6 (six) hours as needed for wheezing.  . levalbuterol (XOPENEX) 1.25 MG/3ML nebulizer solution USE 1 VIAL VIA NEBULIZER EVERY 4 HOURS AS NEEDED FOR WHEEZING OR SHORTNESS OF BREATH  . Lysine 1000 MG TABS Take 2,000-4,000 mg by mouth at bedtime. 2000 mg scheduled at bedtime and patient will take 4000 mg if she has outbreak  . Magnesium 500 MG TABS Take 1 tablet by mouth at bedtime.  . metFORMIN (GLUCOPHAGE-XR) 500 MG 24 hr tablet Take 1 tablet (500 mg total) by mouth daily with breakfast. For diabetes.  Marland Kitchen neomycin-bacitracin-polymyxin (NEOSPORIN) [id number] ointment Apply 1 application  topically 4 (four) times daily as needed (for cut/scrapes.).  Marland Kitchen pantoprazole (PROTONIX) 40 MG tablet Take 1 tablet (40 mg total) by mouth daily.  . phenazopyridine (AZO-TABS) 95 MG tablet Take 95 mg by mouth 3 (three) times daily as needed for pain.  . polyethylene glycol powder (GLYCOLAX/MIRALAX) powder MIX 17 GRAMS (1 CAPFUL) WITH 4-8 OZ OF LIQUID AND TAKE BY MOUTH TWICE DAILY AS NEEDED  . temazepam (RESTORIL) 30 MG capsule TAKE ONE TO TWO CAPSULES BY MOUTH NIGHTLY AT BEDTIME  . tiZANidine (ZANAFLEX) 4 MG tablet TAKE 2 TABLETS (8 MG TOTAL) BY MOUTH EVERY 6 (SIX) HOURS AS NEEDED FOR MUSCLE SPASMS.  Marland Kitchen traZODone (DESYREL) 50 MG tablet Take 2 tablets (100 mg total) by mouth at bedtime.     Allergies:   Albuterol, Crestor [rosuvastatin], Halcion [triazolam], Levaquin [levofloxacin in d5w], Naproxen sodium, Tylenol [acetaminophen], Cefaclor, Diclofenac sodium, Sulfa antibiotics, Tramadol, Aripiprazole, Diclofenac sodium, and Ibuprofen   Social History   Socioeconomic History  . Marital status: Divorced    Spouse name: Not on file  . Number of children: 1  . Years of education: Not on file  . Highest  education level: Not on file  Occupational History  . 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 Smoking status: Former Smoker    Years: 25.00    Types: Cigarettes    Quit date: 11/16/2012    Years since quitting: 7.5  . Smokeless tobacco: Never Used  . Tobacco comment: occasional use  Vaping Use  . Vaping Use: Former  Substance and Sexual Activity  . Alcohol use: No    Alcohol/week: 0.0 standard drinks  . Drug use: No    Types: Methylphenidate  . Sexual activity: Never  Other Topics Concern  . Not on file  Social History Narrative  . Not on file   Social Determinants of Health   Financial Resource Strain: Low Risk   . Difficulty of Paying Living Expenses: Not hard at all  Food Insecurity: No Food Insecurity  . Worried About Charity fundraiser in the Last Year: Never true  . Ran Out of Food in the Last Year: Never true  Transportation Needs: No Transportation Needs  . Lack of Transportation (Medical): No  . Lack of Transportation (Non-Medical): No  Physical Activity: Inactive  . Days of Exercise per Week: 0 days  . Minutes of Exercise per Session: 0 min  Stress: Stress Concern Present  . Feeling of Stress : Very much  Social Connections:   . Frequency of Communication with Friends and Family:   . Frequency of Social Gatherings with Friends and Family:   . Attends Religious Services:   . Active Member of Clubs or Organizations:   . Attends Archivist Meetings:   Marland Kitchen Marital Status:      Family History: The patient's family history includes Cancer in her father and mother; Heart disease in her mother. There is no history of Kidney disease, Bladder Cancer, Prostate cancer, or Kidney cancer.  ROS:   Please see the history of present illness.     All other systems reviewed and are negative.  EKGs/Labs/Other Studies Reviewed:    The following studies were reviewed today:   EKG:  EKG is  ordered today.  The ekg  ordered today demonstrates sinus tachycardia, heart rate 105  Recent Labs: 08/15/2019: Pro B Natriuretic peptide (BNP) 107.0 02/19/2020: ALT 19; B Natriuretic Peptide 22.0; BUN 20; Creatinine, Ser  0.91; Hemoglobin 14.2; Platelets 352; Potassium 3.8; Sodium 139; TSH 0.902  Recent Lipid Panel    Component Value Date/Time   CHOL 225 (H) 04/30/2020 1456   TRIG 205.0 (H) 04/30/2020 1456   HDL 64.90 04/30/2020 1456   CHOLHDL 3 04/30/2020 1456   VLDL 41.0 (H) 04/30/2020 1456   LDLCALC 223 (H) 11/13/2014 0908   LDLDIRECT 139.0 04/30/2020 1456    Physical Exam:    VS:  BP 124/82 (BP Location: Left Arm, Patient Position: Sitting, Cuff Size: Normal)   Pulse (!) 105   Ht 5\' 9"  (1.753 m)   Wt 239 lb 8 oz (108.6 kg)   LMP 08/23/2014   SpO2 98%   BMI 35.37 kg/m     Wt Readings from Last 3 Encounters:  05/17/20 239 lb 8 oz (108.6 kg)  04/30/20 241 lb 8 oz (109.5 kg)  04/17/20 241 lb 3.2 oz (109.4 kg)     GEN:  Well nourished, well developed in no acute distress HEENT: Normal NECK: No JVD; No carotid bruits LYMPHATICS: No lymphadenopathy CARDIAC: RRR, no murmurs, rubs, gallops RESPIRATORY:  Clear to auscultation without rales,  ABDOMEN: Soft, non-tender, distended MUSCULOSKELETAL:  No edema; No deformity  SKIN: Warm and dry NEUROLOGIC:  Alert and oriented x 3 PSYCHIATRIC:  Normal affect   ASSESSMENT:    1. Inappropriate sinus tachycardia   2. Pure hypercholesterolemia   3. BMI 35.0-35.9,adult    PLAN:    In order of problems listed above:  1. Patient with history of tachycardia.  EKG shows sinus tachycardia, heart rate 105 at rest.  Readings on her smart watch have been roughly 115 bpm.  Patient meets criteria for inappropriate sinus tachycardia.  Obesity, other medications might be contributing to this.  Start beta-blocker/Toprol-XL 25 mg daily to see if this helps patient. 2. History of hyperlipidemia, patient not tolerating statins.  Continue Repatha as  prescribed. 3. Obesity, deconditioning.  Low-calorie diet recommended.  Follow-up 3 months.   Medication Adjustments/Labs and Tests Ordered: Current medicines are reviewed at length with the patient today.  Concerns regarding medicines are outlined above.  Orders Placed This Encounter  Procedures  . EKG 12-Lead   Meds ordered this encounter  Medications  . metoprolol succinate (TOPROL-XL) 50 MG 24 hr tablet    Sig: Take 0.5 tablets (25 mg total) by mouth daily. Take with or immediately following a meal.    Dispense:  30 tablet    Refill:  6    Patient Instructions  Medication Instructions:   Your physician has recommended you make the following change in your medication:   Start Toprol XL: Take 0.5 tablets (25 mg total) by mouth daily.   *If you need a refill on your cardiac medications before your next appointment, please call your pharmacy*   Lab Work: None Ordered If you have labs (blood work) drawn today and your tests are completely normal, you will receive your results only by: Marland Kitchen MyChart Message (if you have MyChart) OR . A paper copy in the mail If you have any lab test that is abnormal or we need to change your treatment, we will call you to review the results.   Testing/Procedures: None Ordered   Follow-Up: At [facility], you and your health needs are our priority.  As part of our continuing mission to provide you with exceptional heart care, we have created designated Provider Care Teams.  These Care Teams include your primary Cardiologist (physician) and Advanced Practice Providers (APPs -  Physician Assistants and Nurse Practitioners) who all work together to provide you with the care you need, when you need it.  We recommend signing up for the patient portal called "MyChart".  Sign up information is provided on this After Visit Summary.  MyChart is used to connect with patients for Virtual Visits (Telemedicine).  Patients are able to view lab/test  results, encounter notes, upcoming appointments, etc.  Non-urgent messages can be sent to your provider as well.   To learn more about what you can do with MyChart, go to [email or web address].    Your next appointment:   3 month(s)  The format for your next appointment:   In Person  Provider:   [name] Sable, MD   Other Instructions  Metoprolol Extended-Release Tablets What is this medicine? METOPROLOL (me TOE proe lole) is a beta blocker. It decreases the amount of work your heart has to do and helps your heart beat regularly. It treats high blood pressure and/or prevent chest pain (also called angina). It also treats heart failure. This medicine may be used for other purposes; ask your health care provider or pharmacist if you have questions. COMMON BRAND NAME(S): toprol, Toprol XL What should I tell my health care provider before I take this medicine? They need to know if you have any of these conditions:  diabetes  heart or vessel disease like slow heart rate, worsening heart failure, heart block, sick sinus syndrome or Raynaud's disease  kidney disease  liver disease  lung or breathing disease, like asthma or emphysema  pheochromocytoma  thyroid disease  an unusual or allergic reaction to metoprolol, other beta-blockers, medicines, foods, dyes, or preservatives  pregnant or trying to get pregnant  breast-feeding How should I use this medicine? Take this drug by mouth. Take it as directed on the prescription label at the same time every day. Take it with food. You may cut the tablet in half if it is scored (has a line in the middle of it). This may help you swallow the tablet if the whole tablet is too big. Be sure to take both halves. Do not take just one-half of the tablet. Keep taking it unless your health care provider tells you to stop. Talk to your health care provider about the use of this drug in children. While it may be prescribed for children as young  as 6 for selected conditions, precautions do apply. Overdosage: If you think you have taken too much of this medicine contact a poison control center or emergency room at once. NOTE: This medicine is only for you. Do not share this medicine with others. What if I miss a dose? If you miss a dose, take it as soon as you can. If it is almost time for your next dose, take only that dose. Do not take double or extra doses. What may interact with this medicine? This medicine may interact with the following medications:  certain medicines for blood pressure, heart disease, irregular heart beat  certain medicines for depression, like monoamine oxidase (MAO) inhibitors, fluoxetine, or paroxetine  clonidine  dobutamine  epinephrine  isoproterenol  reserpine This list may not describe all possible interactions. Give your health care provider a list of all the medicines, herbs, non-prescription drugs, or dietary supplements you use. Also tell them if you smoke, drink alcohol, or use illegal drugs. Some items may interact with your medicine. What should I watch for while using this medicine? Visit your doctor or health care professional for regular check  ups. Contact your doctor right away if your symptoms worsen. Check your blood pressure and pulse rate regularly. Ask your health care professional what your blood pressure and pulse rate should be, and when you should contact them. You may get drowsy or dizzy. Do not drive, use machinery, or do anything that needs mental alertness until you know how this medicine affects you. Do not sit or stand up quickly, especially if you are an older patient. This reduces the risk of dizzy or fainting spells. Contact your doctor if these symptoms continue. Alcohol may interfere with the effect of this medicine. Avoid alcoholic drinks. This medicine may increase blood sugar. Ask your healthcare provider if changes in diet or medicines are needed if you have  diabetes. What side effects may I notice from receiving this medicine? Side effects that you should report to your doctor or health care professional as soon as possible:  allergic reactions like skin rash, itching or hives  cold or numb hands or feet  depression  difficulty breathing  faint  fever with sore throat  irregular heartbeat, chest pain  rapid weight gain   signs and symptoms of high blood sugar such as being more thirsty or hungry or having to urinate more than normal. You may also feel very tired or have blurry vision.  swollen legs or ankles Side effects that usually do not require medical attention (report to your doctor or health care professional if they continue or are bothersome):  anxiety or nervousness  change in sex drive or performance  dry skin  headache  nightmares or trouble sleeping  short term memory loss  stomach upset or diarrhea This list may not describe all possible side effects. Call your doctor for medical advice about side effects. You may report side effects to FDA at 1-800-FDA-1088. Where should I keep my medicine? Keep out of the reach of children and pets. Store at room temperature between 20 and 25 degrees C (68 and 77 degrees F). Throw away any unused drug after the expiration date. NOTE: This sheet is a summary. It may not cover all possible information. If you have questions about this medicine, talk to your doctor, pharmacist, or health care provider.  2020 Elsevier/Gold Standard (2019-06-15 18:23:00)      Signed, [name] Sable, MD  05/17/2020 5:24 PM    [place]

## 2020-05-17 NOTE — Patient Instructions (Addendum)
Medication Instructions:   Your physician has recommended you make the following change in your medication:   Start Toprol XL: Take 0.5 tablets (25 mg total) by mouth daily.   *If you need a refill on your cardiac medications before your next appointment, please call your pharmacy*   Lab Work: None Ordered If you have labs (blood work) drawn today and your tests are completely normal, you will receive your results only by: Marland Kitchen MyChart Message (if you have MyChart) OR . A paper copy in the mail If you have any lab test that is abnormal or we need to change your treatment, we will call you to review the results.   Testing/Procedures: None Ordered   Follow-Up: At [facility], you and your health needs are our priority.  As part of our continuing mission to provide you with exceptional heart care, we have created designated Provider Care Teams.  These Care Teams include your primary Cardiologist (physician) and Advanced Practice Providers (APPs -  Physician Assistants and Nurse Practitioners) who all work together to provide you with the care you need, when you need it.  We recommend signing up for the patient portal called "MyChart".  Sign up information is provided on this After Visit Summary.  MyChart is used to connect with patients for Virtual Visits (Telemedicine).  Patients are able to view lab/test results, encounter notes, upcoming appointments, etc.  Non-urgent messages can be sent to your provider as well.   To learn more about what you can do with MyChart, go to [email or web address].    Your next appointment:   3 month(s)  The format for your next appointment:   In Person  Provider:   [name] Sable, MD   Other Instructions  Metoprolol Extended-Release Tablets What is this medicine? METOPROLOL (me TOE proe lole) is a beta blocker. It decreases the amount of work your heart has to do and helps your heart beat regularly. It treats high blood pressure and/or  prevent chest pain (also called angina). It also treats heart failure. This medicine may be used for other purposes; ask your health care provider or pharmacist if you have questions. COMMON BRAND NAME(S): toprol, Toprol XL What should I tell my health care provider before I take this medicine? They need to know if you have any of these conditions:  diabetes  heart or vessel disease like slow heart rate, worsening heart failure, heart block, sick sinus syndrome or Raynaud's disease  kidney disease  liver disease  lung or breathing disease, like asthma or emphysema  pheochromocytoma  thyroid disease  an unusual or allergic reaction to metoprolol, other beta-blockers, medicines, foods, dyes, or preservatives  pregnant or trying to get pregnant  breast-feeding How should I use this medicine? Take this drug by mouth. Take it as directed on the prescription label at the same time every day. Take it with food. You may cut the tablet in half if it is scored (has a line in the middle of it). This may help you swallow the tablet if the whole tablet is too big. Be sure to take both halves. Do not take just one-half of the tablet. Keep taking it unless your health care provider tells you to stop. Talk to your health care provider about the use of this drug in children. While it may be prescribed for children as young as 6 for selected conditions, precautions do apply. Overdosage: If you think you have taken too much of this medicine contact a poison control center  or emergency room at once. NOTE: This medicine is only for you. Do not share this medicine with others. What if I miss a dose? If you miss a dose, take it as soon as you can. If it is almost time for your next dose, take only that dose. Do not take double or extra doses. What may interact with this medicine? This medicine may interact with the following medications:  certain medicines for blood pressure, heart disease, irregular  heart beat  certain medicines for depression, like monoamine oxidase (MAO) inhibitors, fluoxetine, or paroxetine  clonidine  dobutamine  epinephrine  isoproterenol  reserpine This list may not describe all possible interactions. Give your health care provider a list of all the medicines, herbs, non-prescription drugs, or dietary supplements you use. Also tell them if you smoke, drink alcohol, or use illegal drugs. Some items may interact with your medicine. What should I watch for while using this medicine? Visit your doctor or health care professional for regular check ups. Contact your doctor right away if your symptoms worsen. Check your blood pressure and pulse rate regularly. Ask your health care professional what your blood pressure and pulse rate should be, and when you should contact them. You may get drowsy or dizzy. Do not drive, use machinery, or do anything that needs mental alertness until you know how this medicine affects you. Do not sit or stand up quickly, especially if you are an older patient. This reduces the risk of dizzy or fainting spells. Contact your doctor if these symptoms continue. Alcohol may interfere with the effect of this medicine. Avoid alcoholic drinks. This medicine may increase blood sugar. Ask your healthcare provider if changes in diet or medicines are needed if you have diabetes. What side effects may I notice from receiving this medicine? Side effects that you should report to your doctor or health care professional as soon as possible:  allergic reactions like skin rash, itching or hives  cold or numb hands or feet  depression  difficulty breathing  faint  fever with sore throat  irregular heartbeat, chest pain  rapid weight gain   signs and symptoms of high blood sugar such as being more thirsty or hungry or having to urinate more than normal. You may also feel very tired or have blurry vision.  swollen legs or ankles Side effects  that usually do not require medical attention (report to your doctor or health care professional if they continue or are bothersome):  anxiety or nervousness  change in sex drive or performance  dry skin  headache  nightmares or trouble sleeping  short term memory loss  stomach upset or diarrhea This list may not describe all possible side effects. Call your doctor for medical advice about side effects. You may report side effects to FDA at 1-800-FDA-1088. Where should I keep my medicine? Keep out of the reach of children and pets. Store at room temperature between 20 and 25 degrees C (68 and 77 degrees F). Throw away any unused drug after the expiration date. NOTE: This sheet is a summary. It may not cover all possible information. If you have questions about this medicine, talk to your doctor, pharmacist, or health care provider.  2020 [employer] Standard (2019-06-15 18:23:00)

## 2020-05-19 ENCOUNTER — Other Ambulatory Visit (HOSPITAL_COMMUNITY): Payer: Self-pay | Admitting: Psychiatry

## 2020-05-26 DIAGNOSIS — R7303 Prediabetes: Secondary | ICD-10-CM

## 2020-05-27 ENCOUNTER — Other Ambulatory Visit: Payer: Self-pay | Admitting: Dermatology

## 2020-05-27 DIAGNOSIS — L509 Urticaria, unspecified: Secondary | ICD-10-CM

## 2020-05-27 MED ORDER — FREESTYLE LIBRE 14 DAY SENSOR MISC: 2 refills | Status: DC

## 2020-05-28 MED ORDER — FREESTYLE LIBRE 14 DAY SENSOR MISC: 2 refills | Status: DC

## 2020-05-28 NOTE — Telephone Encounter (Signed)
Patient needs follow up appointment, JS

## 2020-05-29 DIAGNOSIS — M5442 Lumbago with sciatica, left side: Secondary | ICD-10-CM | POA: Diagnosis not present

## 2020-05-29 DIAGNOSIS — M5441 Lumbago with sciatica, right side: Secondary | ICD-10-CM | POA: Diagnosis not present

## 2020-05-29 DIAGNOSIS — G8929 Other chronic pain: Secondary | ICD-10-CM | POA: Diagnosis not present

## 2020-05-29 DIAGNOSIS — M48061 Spinal stenosis, lumbar region without neurogenic claudication: Secondary | ICD-10-CM | POA: Diagnosis not present

## 2020-05-29 DIAGNOSIS — M542 Cervicalgia: Secondary | ICD-10-CM | POA: Diagnosis not present

## 2020-05-29 DIAGNOSIS — M5412 Radiculopathy, cervical region: Secondary | ICD-10-CM | POA: Diagnosis not present

## 2020-05-30 ENCOUNTER — Ambulatory Visit: Payer: PPO | Admitting: Family Medicine

## 2020-06-04 ENCOUNTER — Telehealth: Payer: Self-pay | Admitting: Primary Care

## 2020-06-04 NOTE — Telephone Encounter (Signed)
Pt called wanting to know if dr copland can work her in to be seen.   She fell Friday getting on off the plane and fell to right and hurt her wrist/lower back And then yesterday she fell again to the left and hurt left leg and whole left side

## 2020-06-04 NOTE — Telephone Encounter (Signed)
Can you open up my 4 PM slot tomorrow, and I would be happy to see her then.

## 2020-06-04 NOTE — Telephone Encounter (Signed)
Appointment 7/21 Pt aware

## 2020-06-05 ENCOUNTER — Ambulatory Visit (INDEPENDENT_AMBULATORY_CARE_PROVIDER_SITE_OTHER): Payer: PPO | Admitting: Family Medicine

## 2020-06-05 ENCOUNTER — Other Ambulatory Visit: Payer: Self-pay

## 2020-06-05 ENCOUNTER — Ambulatory Visit (INDEPENDENT_AMBULATORY_CARE_PROVIDER_SITE_OTHER)
Admission: RE | Admit: 2020-06-05 | Discharge: 2020-06-05 | Disposition: A | Discharge status: Home / Self Care | Payer: PPO | Source: Ambulatory Visit | Attending: Family Medicine | Admitting: Family Medicine

## 2020-06-05 ENCOUNTER — Ambulatory Visit
Admission: RE | Admit: 2020-06-05 | Discharge: 2020-06-05 | Disposition: A | Discharge status: Home / Self Care | Payer: PPO | Source: Ambulatory Visit | Attending: Family Medicine | Admitting: Family Medicine

## 2020-06-05 ENCOUNTER — Ambulatory Visit: Payer: PPO | Admitting: Primary Care

## 2020-06-05 ENCOUNTER — Encounter: Payer: Self-pay | Admitting: Family Medicine

## 2020-06-05 VITALS — BP 120/84 | HR 98 | Temp 97.9°F | Ht 69.0 in | Wt 246.8 lb

## 2020-06-05 DIAGNOSIS — M79672 Pain in left foot: Secondary | ICD-10-CM

## 2020-06-05 DIAGNOSIS — M25531 Pain in right wrist: Secondary | ICD-10-CM | POA: Diagnosis not present

## 2020-06-05 DIAGNOSIS — S2232XA Fracture of one rib, left side, initial encounter for closed fracture: Secondary | ICD-10-CM | POA: Diagnosis not present

## 2020-06-05 DIAGNOSIS — M25571 Pain in right ankle and joints of right foot: Secondary | ICD-10-CM | POA: Diagnosis not present

## 2020-06-05 DIAGNOSIS — W19XXXA Unspecified fall, initial encounter: Secondary | ICD-10-CM

## 2020-06-05 DIAGNOSIS — S6992XA Unspecified injury of left wrist, hand and finger(s), initial encounter: Secondary | ICD-10-CM | POA: Diagnosis not present

## 2020-06-05 DIAGNOSIS — S99922A Unspecified injury of left foot, initial encounter: Secondary | ICD-10-CM | POA: Diagnosis not present

## 2020-06-05 DIAGNOSIS — G35 Multiple sclerosis: Secondary | ICD-10-CM

## 2020-06-05 DIAGNOSIS — S6991XA Unspecified injury of right wrist, hand and finger(s), initial encounter: Secondary | ICD-10-CM | POA: Diagnosis not present

## 2020-06-05 DIAGNOSIS — M25532 Pain in left wrist: Secondary | ICD-10-CM

## 2020-06-05 DIAGNOSIS — S99911A Unspecified injury of right ankle, initial encounter: Secondary | ICD-10-CM | POA: Diagnosis not present

## 2020-06-05 IMAGING — DX DG FOOT COMPLETE 3+V*L*
3 series · 3 of 3 positions shown · non-contrast
Comparison: None.

CLINICAL DATA: Recent fall with foot pain, initial encounter

EXAM:
LEFT FOOT - COMPLETE 3+ VIEW

[foot ap]
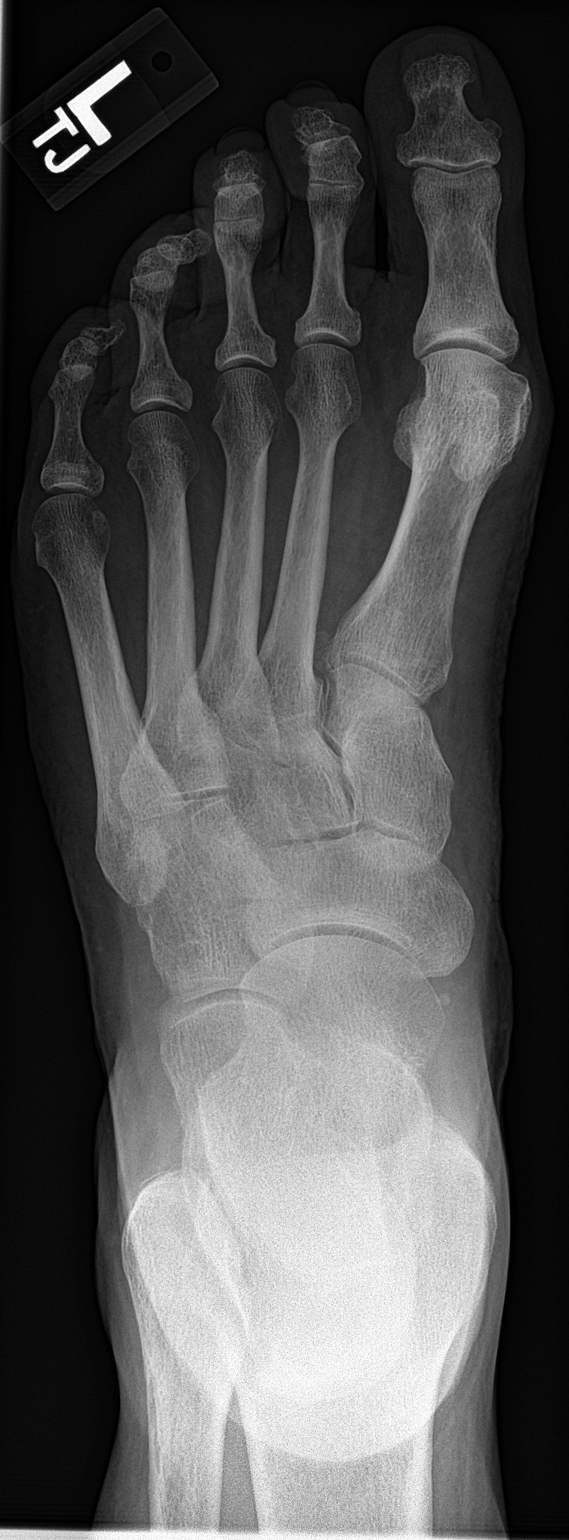

[foot obl]
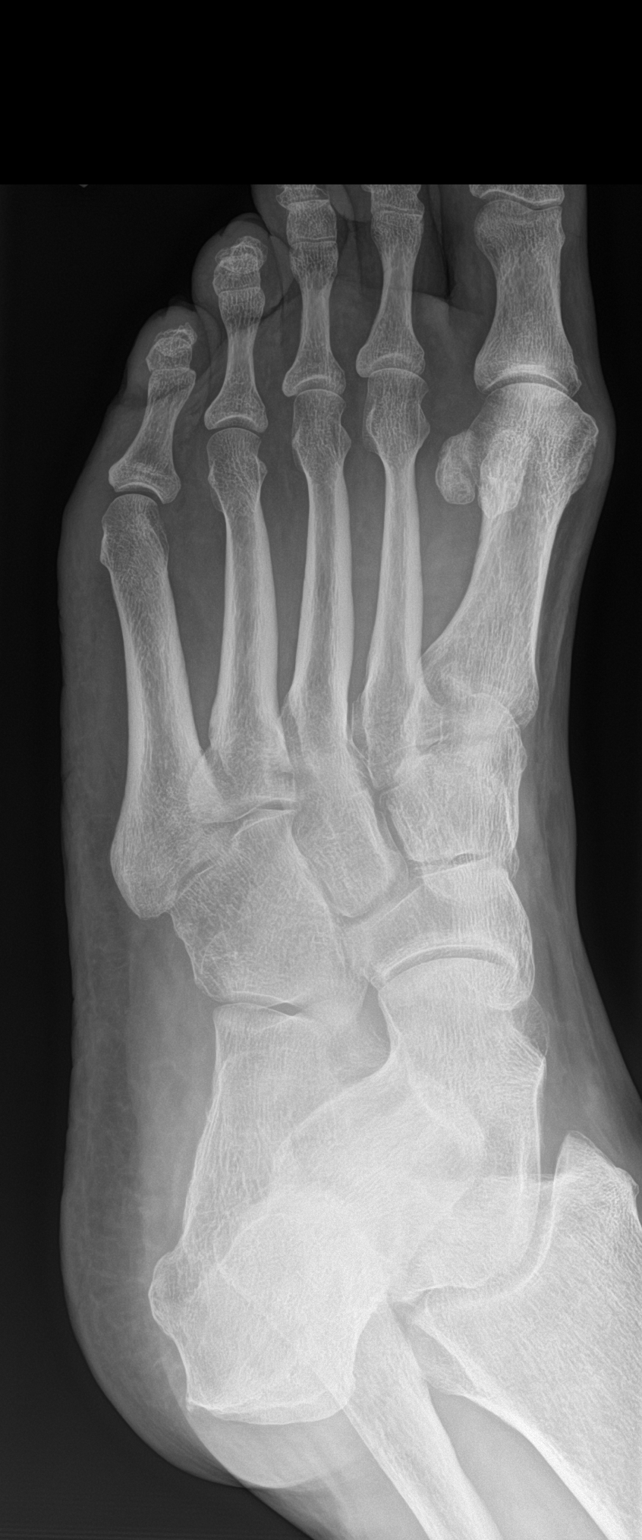

[foot lat]
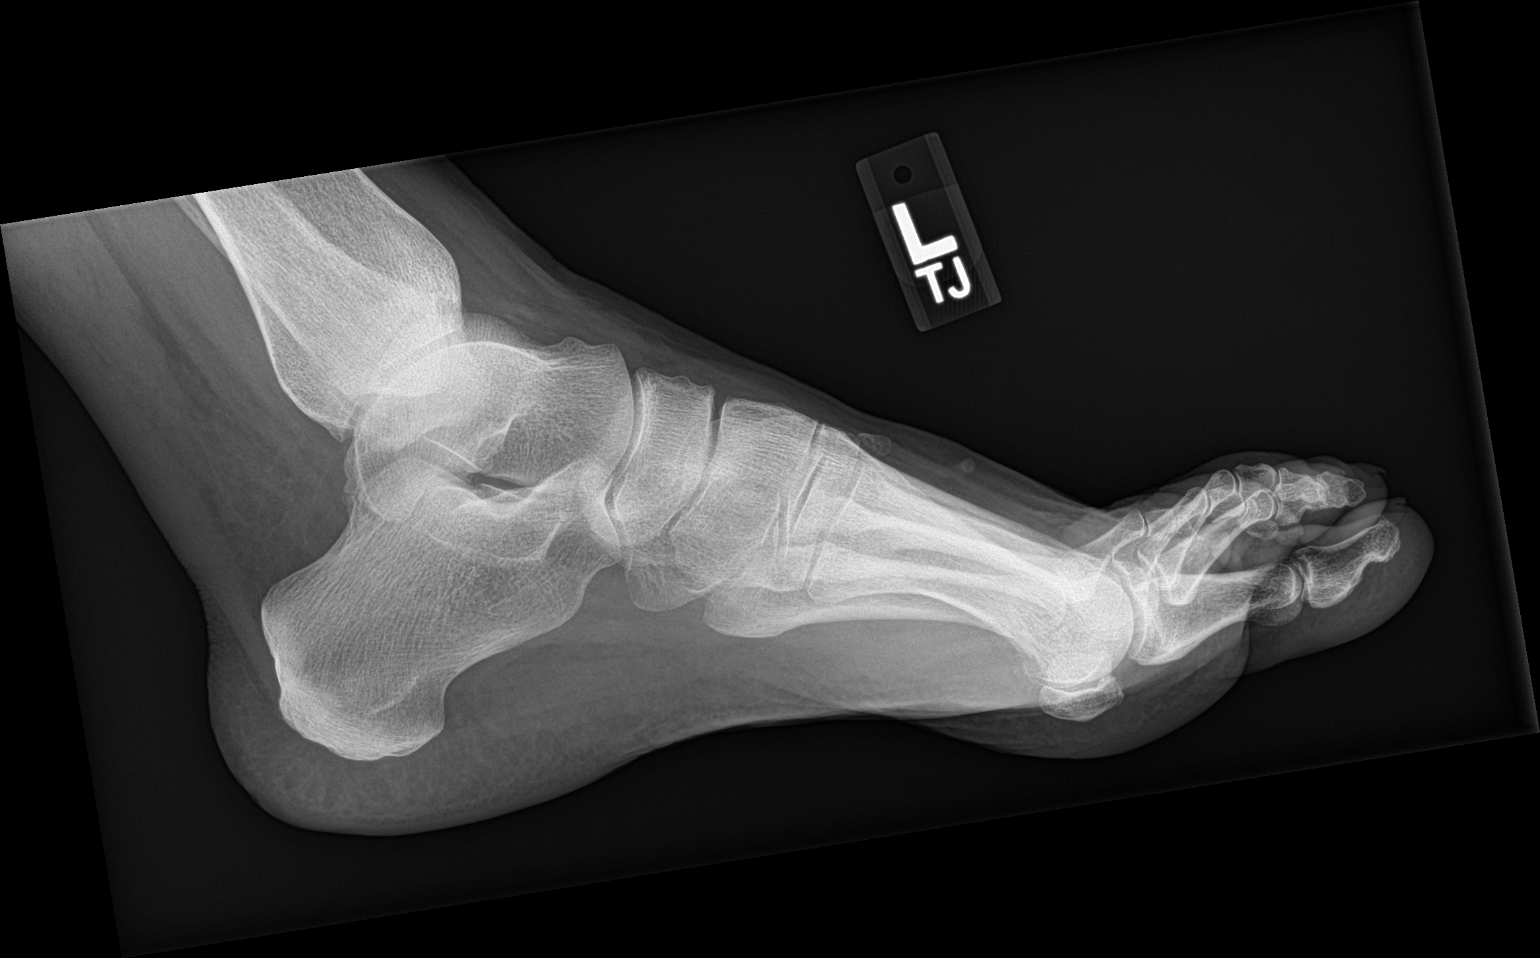

[3 of 3 positions shown; findings below may reference images not displayed]

FINDINGS: There is no evidence of fracture or dislocation. There is no
evidence of arthropathy or other focal bone abnormality. Soft
tissues are unremarkable.
IMPRESSION: No acute abnormality noted.

## 2020-06-05 IMAGING — DX DG WRIST COMPLETE 3+V*R*
4 series · 4 of 4 positions shown · non-contrast
Comparison: None.

CLINICAL DATA: Recent fall with right wrist pain, initial encounter

EXAM:
RIGHT WRIST - COMPLETE 3+ VIEW

[wrist ap]
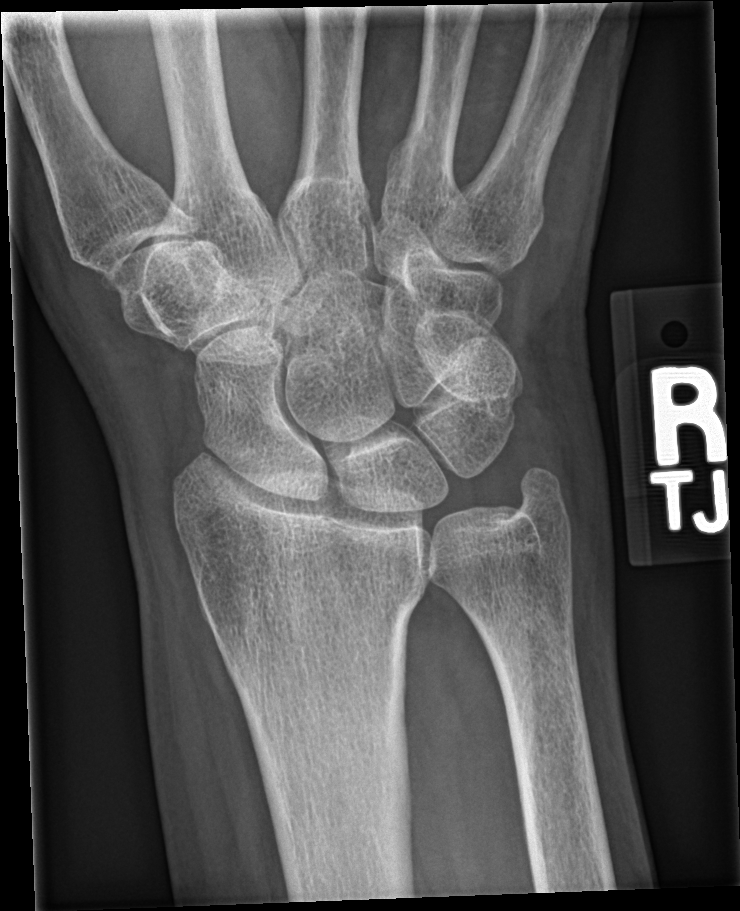

[wrist obl]
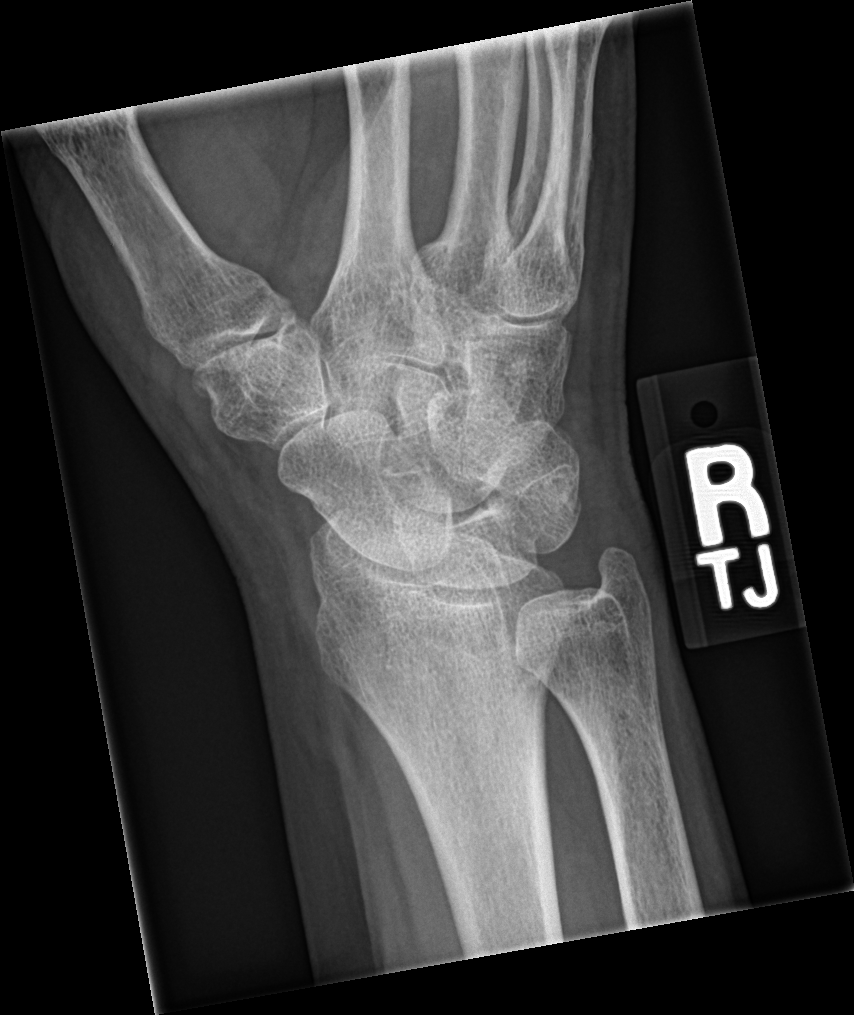

[wrist lat]
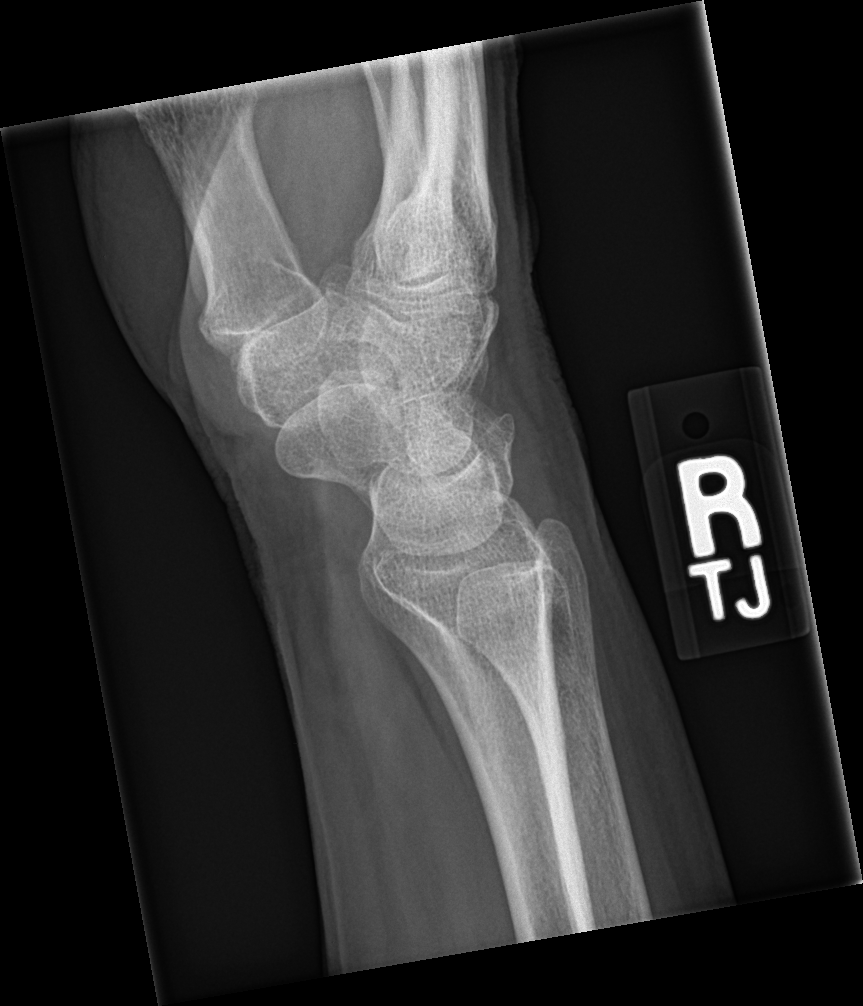

[wrist pa]
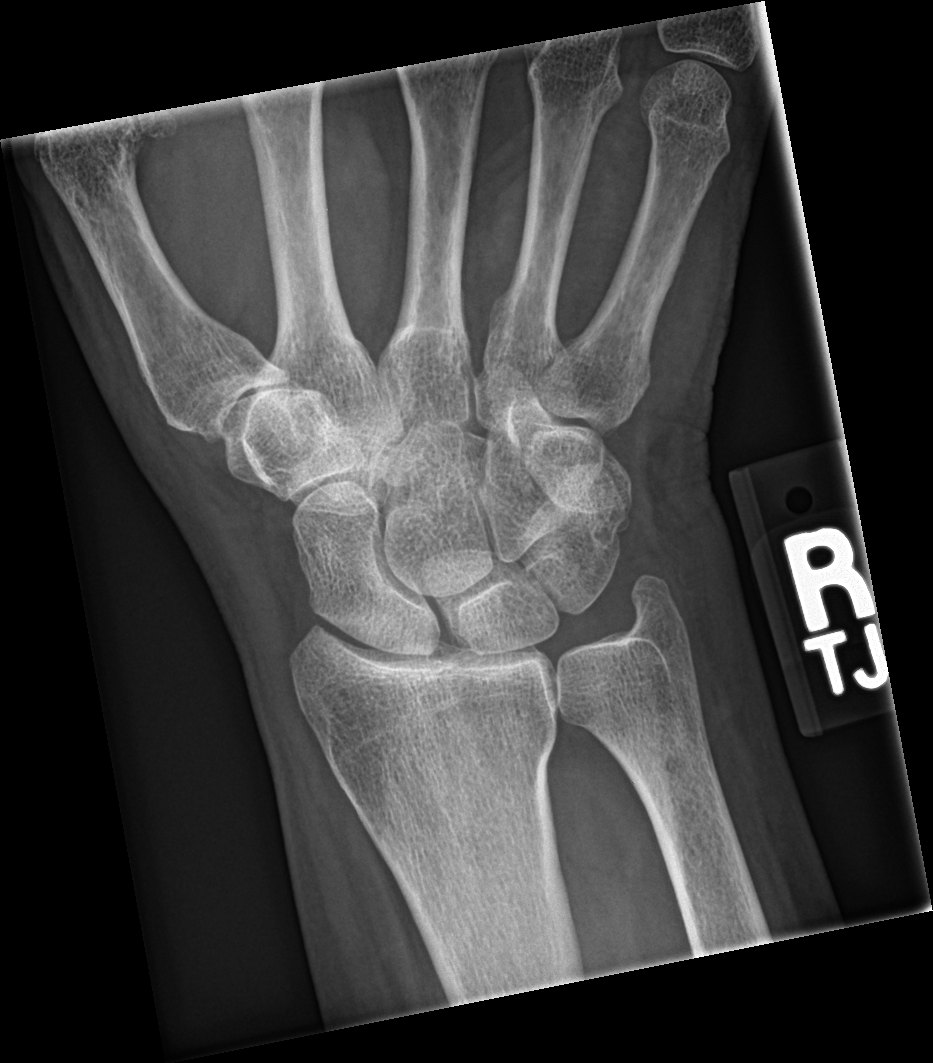

[4 of 4 positions shown; findings below may reference images not displayed]

FINDINGS: There is no evidence of fracture or dislocation. There is no
evidence of arthropathy or other focal bone abnormality. Soft
tissues are unremarkable.
IMPRESSION: No acute abnormality noted.

## 2020-06-05 IMAGING — DX DG WRIST COMPLETE 3+V*L*
4 series · 4 of 4 positions shown · non-contrast
Comparison: None.

CLINICAL DATA: Recent fall with left wrist pain, initial encounter

EXAM:
LEFT WRIST - COMPLETE 3+ VIEW

[wrist ap]
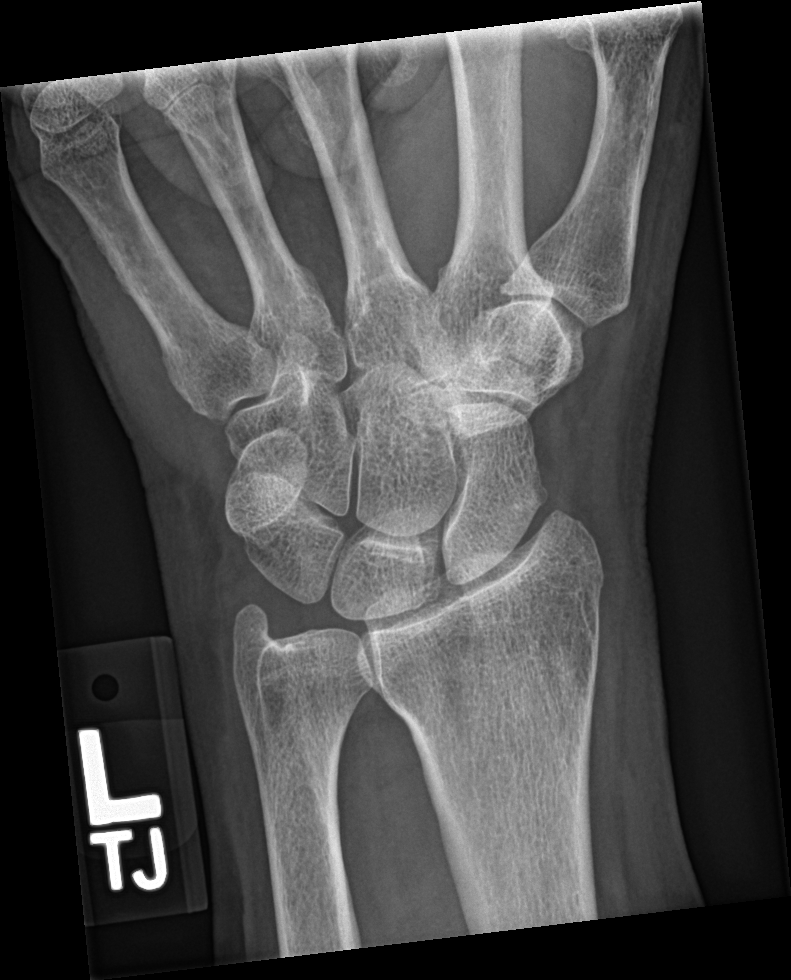

[wrist obl]
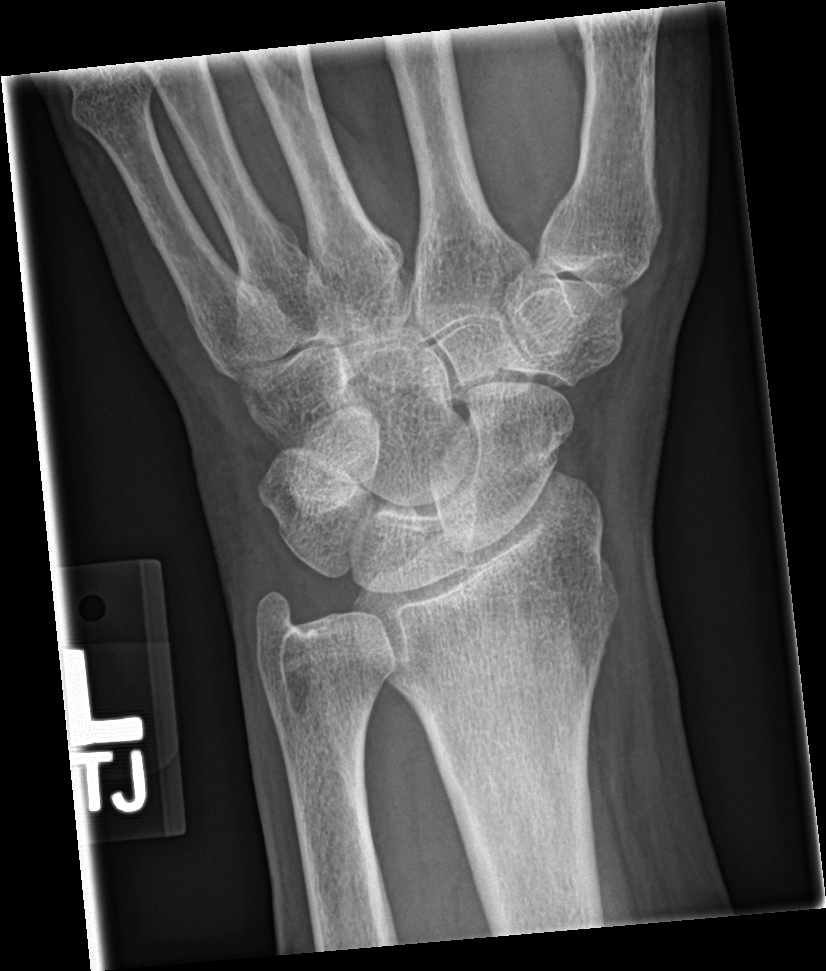

[wrist lat]
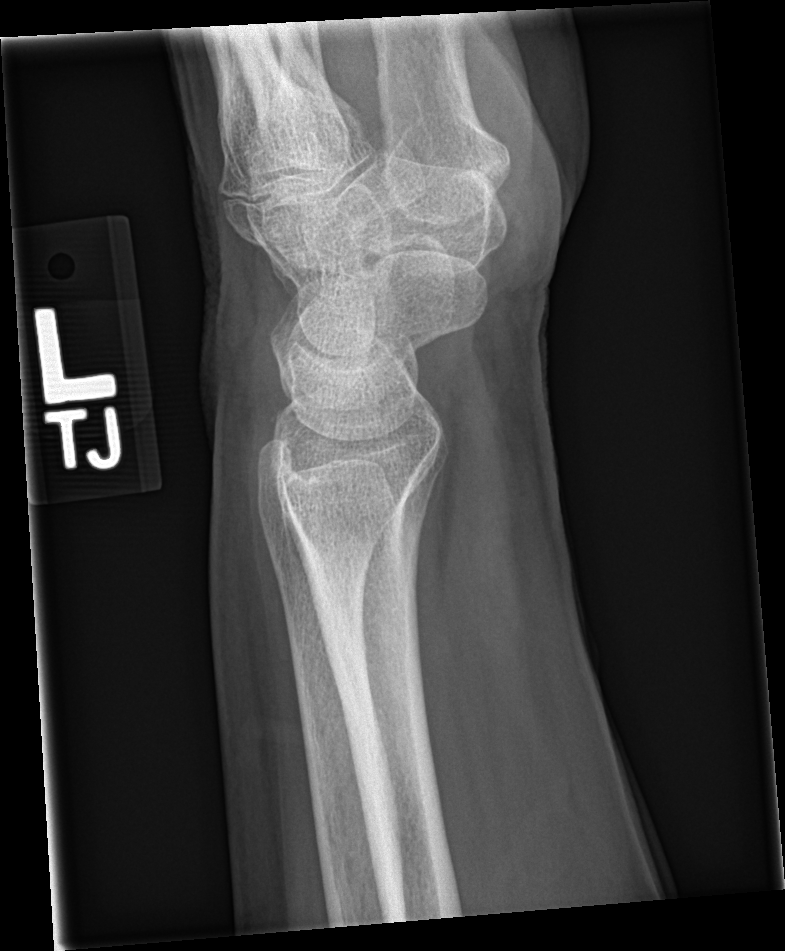

[wrist pa]
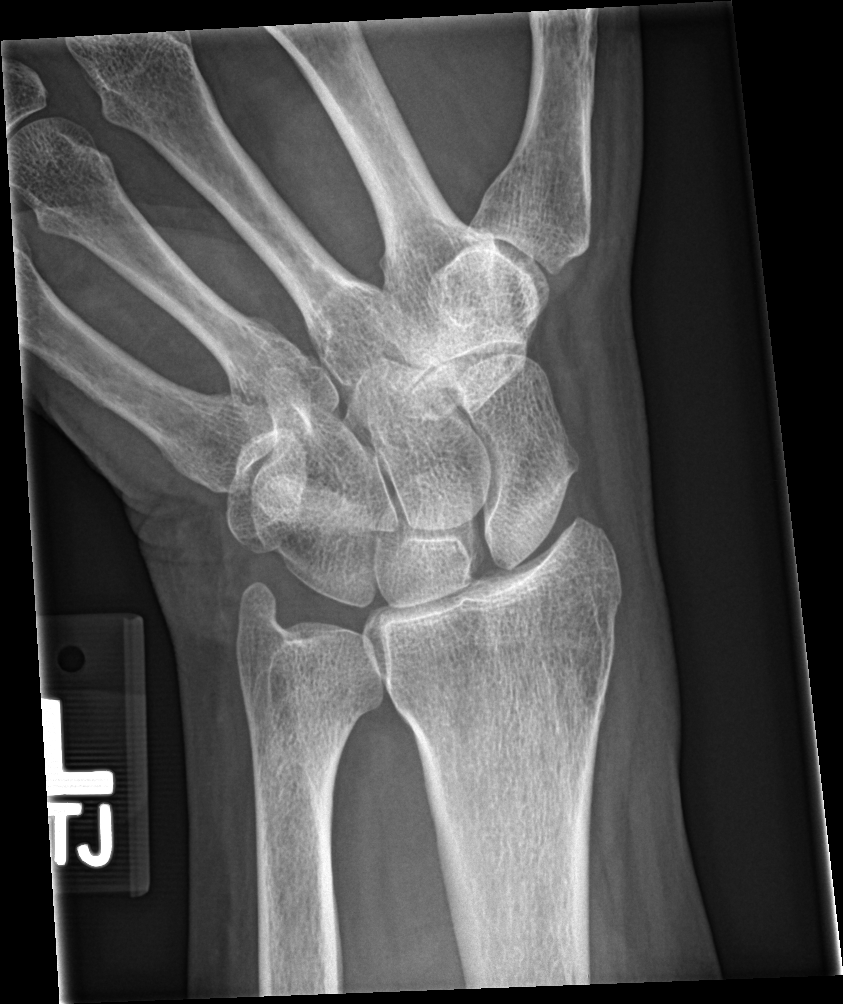

[4 of 4 positions shown; findings below may reference images not displayed]

FINDINGS: There is no evidence of fracture or dislocation. There is no
evidence of arthropathy or other focal bone abnormality. Soft
tissues are unremarkable.
IMPRESSION: No acute abnormality noted.

## 2020-06-05 IMAGING — DX DG ANKLE COMPLETE 3+V*R*
3 series · 3 of 3 positions shown · non-contrast
Comparison: None.

CLINICAL DATA: Recent fall with right ankle pain, initial encounter

EXAM:
RIGHT ANKLE - COMPLETE 3+ VIEW

[ankle ap]
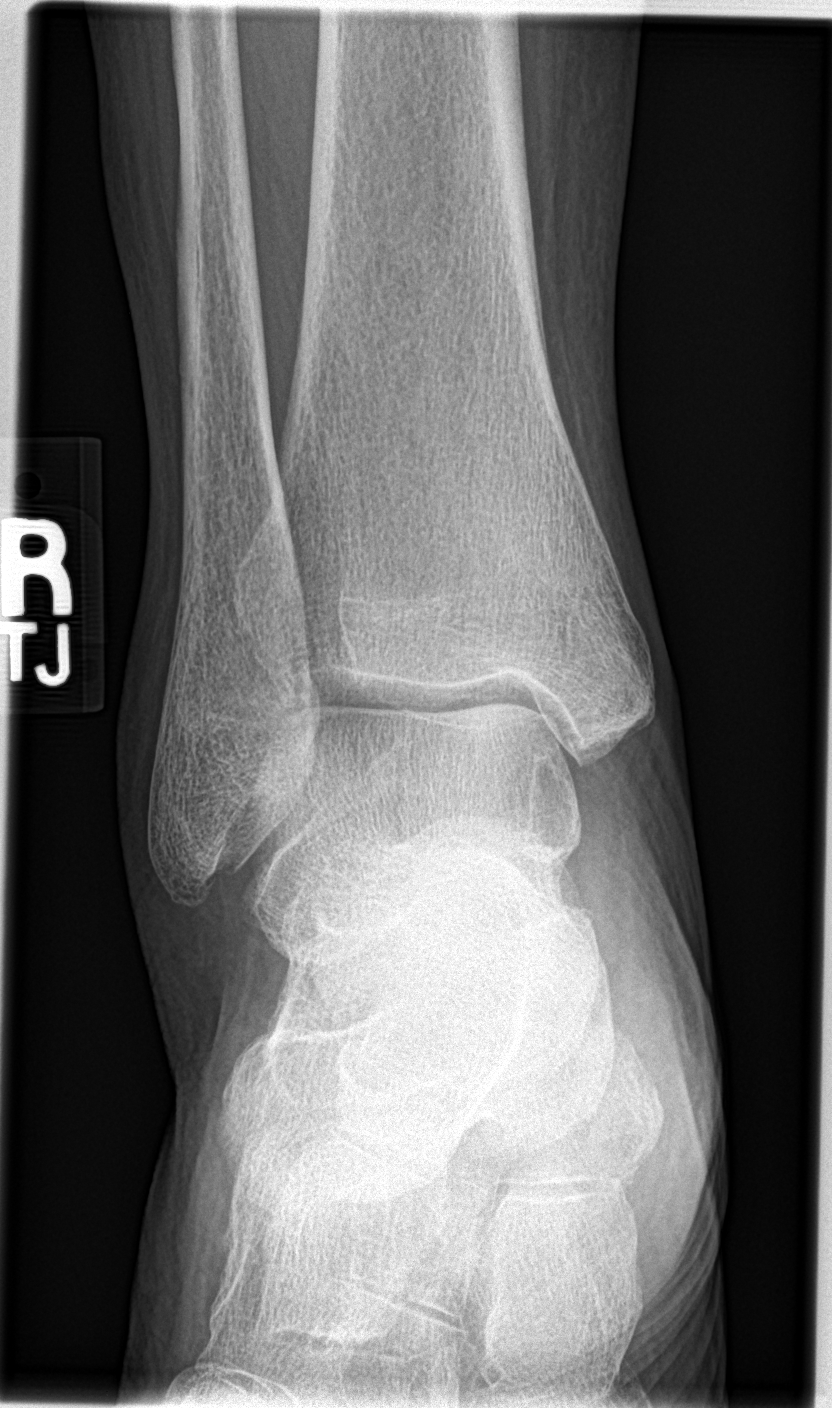

[ankle mortise]
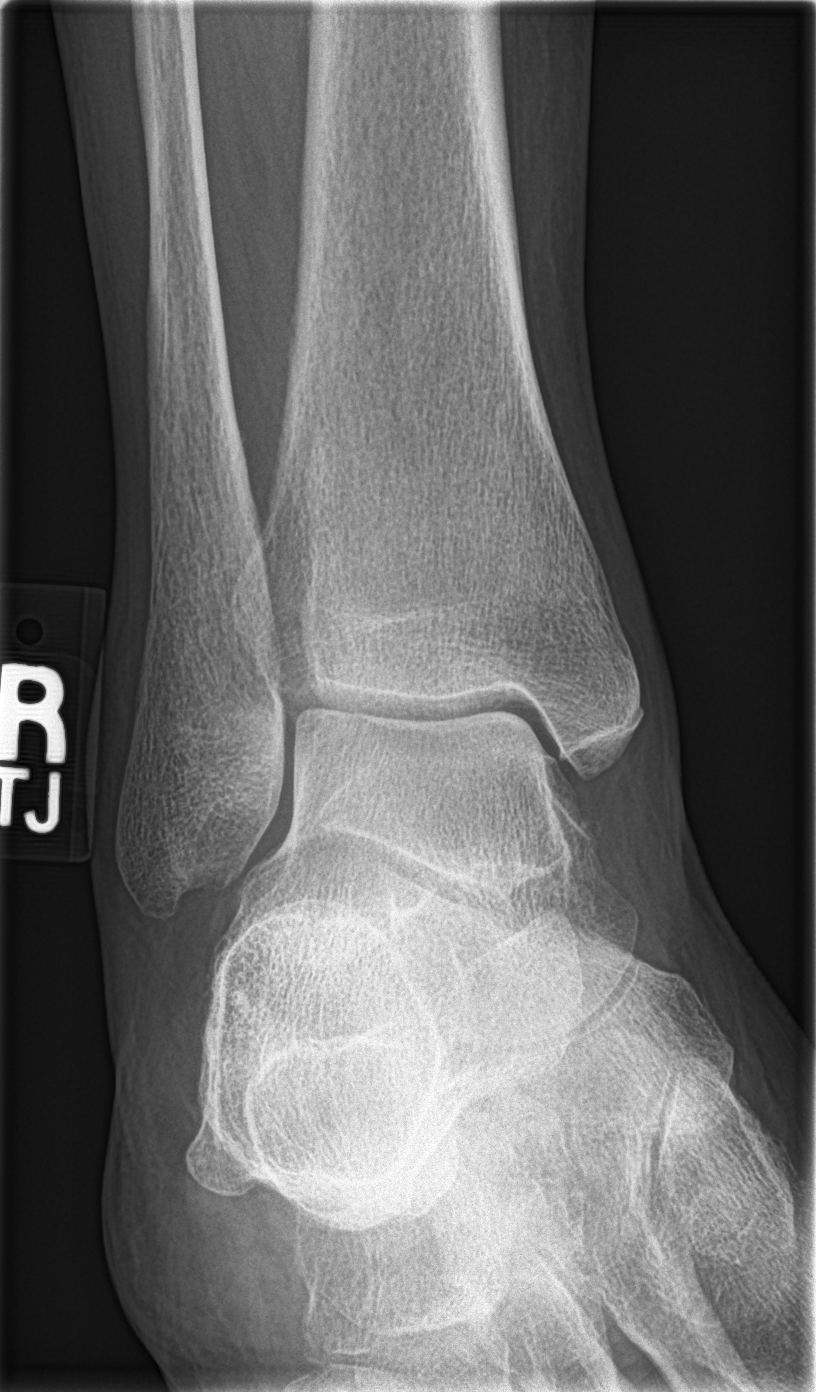

[ankle lat]
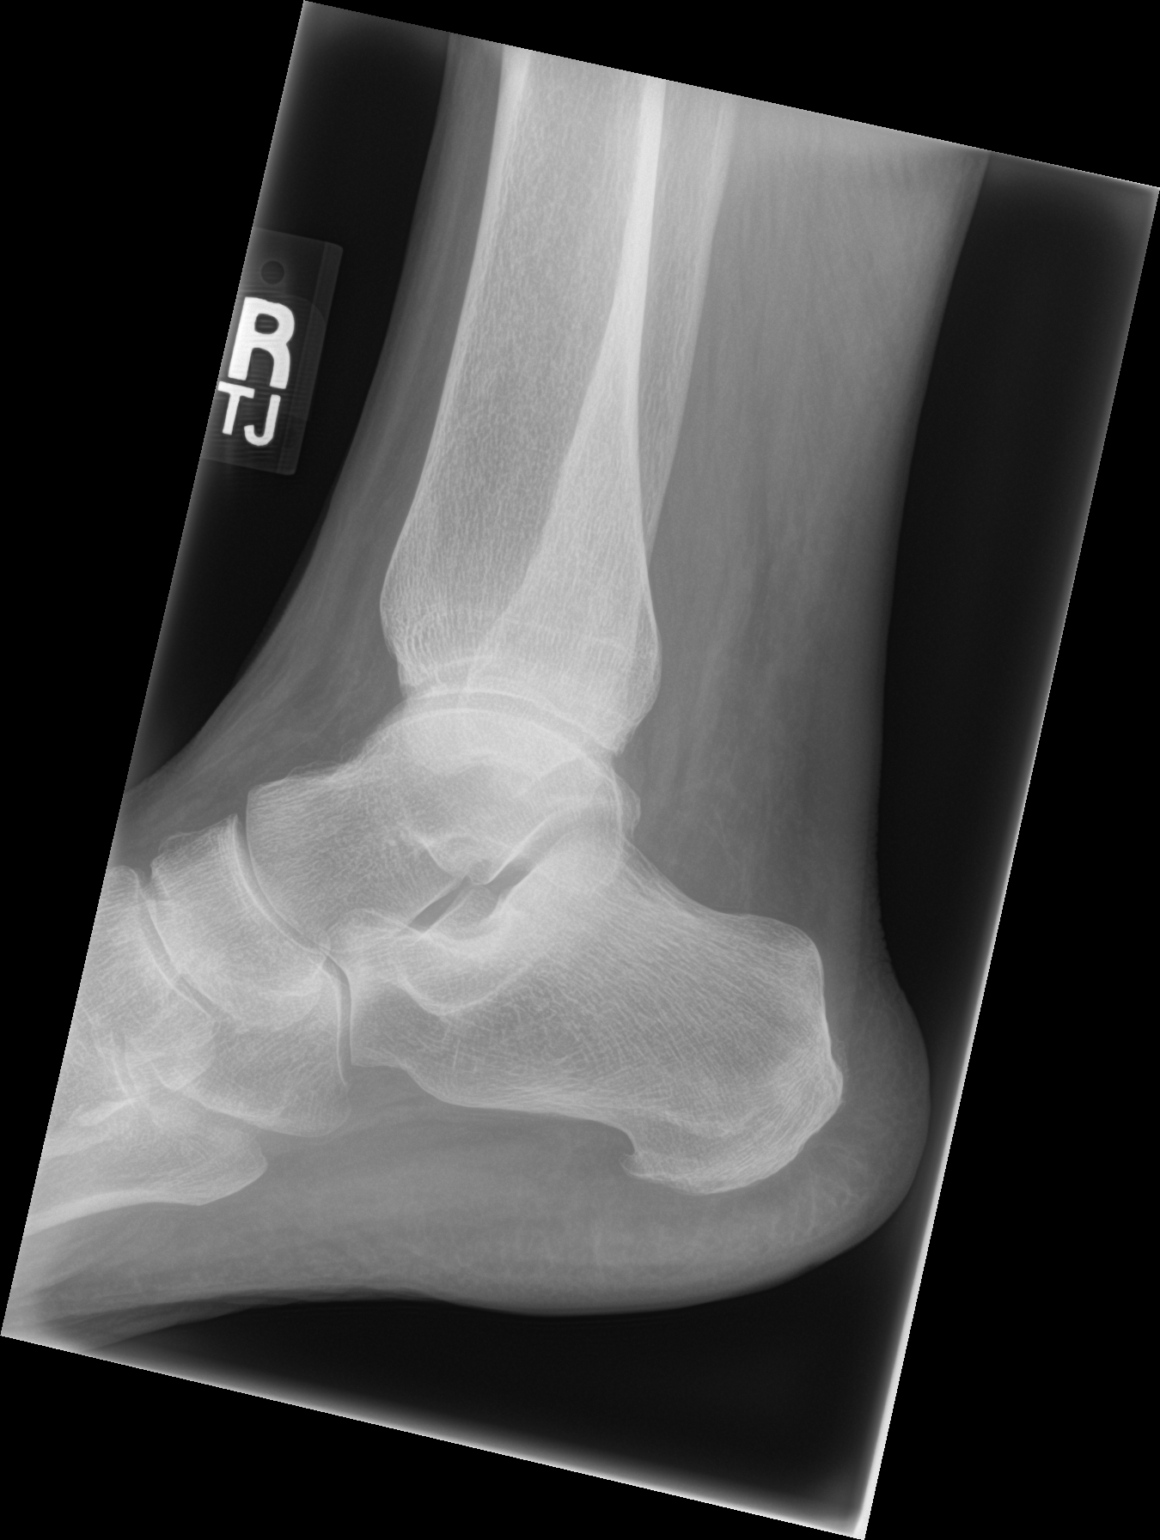

[3 of 3 positions shown; findings below may reference images not displayed]

FINDINGS: There is no evidence of fracture, dislocation, or joint effusion.
There is no evidence of arthropathy or other focal bone abnormality.
Soft tissues are unremarkable.
IMPRESSION: No acute abnormality noted.

## 2020-06-05 MED ORDER — HYDROCODONE-ACETAMINOPHEN 10-325 MG PO TABS: 1.0000 | ORAL_TABLET | Freq: Four times a day (QID) | ORAL | 0 refills | Status: DC | PRN

## 2020-06-05 NOTE — Progress Notes (Signed)
Emily Moon T. Emily Figley, MD, [name]  [facility] [place] at [facility] [place], [address]  Phone: [phone]  FAX: [place] - 59 y.o. female  MRN [id number]  Date of Birth: 30-Aug-1961  Date: 06/05/2020  PCP: [name], NP  Referral: [name], NP  Chief Complaint  Patient presents with  . Fall    x 2    This visit occurred during the SARS-CoV-2 public health emergency.  Safety protocols were in place, including screening questions prior to the visit, additional usage of staff PPE, and extensive cleaning of exam room while observing appropriate contact time as indicated for disinfecting solutions.   Subjective:   Emily Moon is a 59 y.o. very pleasant female patient with Body mass index is 36.44 kg/m. who presents with the following:  Well-known patient who was acutely worked and for 2 separate falls.  She [place] of her departure as well as her return flight.  She tripped and fell hard landing on her wrist bilaterally, ribs on the right side.  She also struck her shoulders, as well as her knees.  She also is having some foot pain and ankle pain.  She has been taking a lot of ibuprofen including 800 mg p.o. every 4 hours.  She has had increased instability secondary to her multiple sclerosis.  She has significant pain and bruising on her right lateral side as well as some pain and modest swelling at the wrist.  Other areas of maximal tenderness include the left foot, right ankle.  WRISTS Hurt the most.  R posterior rubs   L foot B wrists  Wt Readings from Last 3 Encounters:  06/05/20 246 lb 12 oz (111.9 kg)  05/17/20 239 lb 8 oz (108.6 kg)  04/30/20 241 lb 8 oz (109.5 kg)     Review of Systems is noted in the HPI, as appropriate   Objective:   BP 120/84   Pulse 98   Temp 97.9 F (36.6 C) (Temporal)   Ht  5\' 9"  (1.753 m)   Wt 246 lb 12 oz (111.9 kg)   LMP 08/23/2014   SpO2 97%   BMI 36.44 kg/m    GEN: No acute distress; alert,appropriate. PULM: Breathing comfortably in no respiratory distress PSYCH: Normally interactive.    She has nontender throughout the clavicle, AC joint, scapula, as well as the humerus.  Full range of motion at the shoulders with 5/5 strength.  Full range of motion of both elbows in all directions.  Strength is 5/5 in all directions at the elbow.  She does have some modest tenderness at the lateral epicondyle at the right.  She has nontender throughout the radius and ulna bilaterally from the proximal aspect all the way to the distal radius and ulna.  She does have some pain with compression of the carpal bones bilaterally.  No significant tenderness at the metacarpals, or any of the digits.  Grip remains fully intact.  Neurovascularly intact in the bilateral upper left upper extremities.  Full range of motion at the knees with some modest tenderness in the anterior knee bilaterally.  The entirety of the tibia and fibula are nontender.  The midfoot is nontender bilaterally.  On the left foot, there is some notable tenderness around the third and fourth metacarpal.  All phalanges are nontender.  Midfoot and ankle as well as foot  are entirely nontender on the left.  On the right the forefoot is nontender as well as the midfoot and hindfoot.  She does have some tenderness at the lateral malleolus and predominantly at the ATFL.  No involvement at the deltoid ligament or CF.  On right ribs she has extensive bruising and tenderness in the lowest ribs.  Radiology: No results found.  Assessment and Plan:     ICD-10-CM   1. Bilateral wrist pain  M25.531 DG Wrist Complete Left   M25.532 DG Wrist Complete Right  2. Acute pain of left foot  M79.672 DG Foot Complete Left  3. Acute right ankle pain  M25.571 DG Ankle Complete Right  4. Closed fracture of one rib of left  side, initial encounter  S22.32XA   5. Fall, initial encounter  W19.XXXA   6. MULTIPLE SCLEROSIS, PROGRESSIVE/RELAPSING  G35      ICD-10-CM   1. Bilateral wrist pain  M25.531 DG Wrist Complete Left   M25.532 DG Wrist Complete Right  2. Acute pain of left foot  M79.672 DG Foot Complete Left  3. Acute right ankle pain  M25.571 DG Ankle Complete Right  4. Closed fracture of one rib of left side, initial encounter  S22.32XA   5. Fall, initial encounter  W19.XXXA   6. MULTIPLE SCLEROSIS, PROGRESSIVE/RELAPSING  G35    Total encounter time: 40 minutes. This includes total time spent on the day of encounter.  I spent approximately 15 minutes alone reading the patient's radiographs independently and reviewing them all with the patient.  Additional time spent in anatomic evaluation and explanation with the patient.  Additional chart review on her progressive MS.  Evaluation of recent laboratories.  On my interpretation, I do not see any acute fractures.  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  Almost certainly she has at least one lower rib fracture.  Utility of radiograph of the ribs is of minimal in importance and help.  ATFL sprain, right ankle.  Diffuse bruising bruising as well as soft tissue bruising on R ribs.  Reviewed plan of care, I would anticipate her having significant pain for at least 3 weeks.  Ribs will likely be painful for at least 4 to 5 weeks.  She is in some significant acute pain, difficulty ambulating, and I classically do give people some pain medication for fractured ribs.  I think that is not unreasonable give her some short-term narcotics for management of acute pain.  I will carbon copy her primary care doctor as well.  Follow-up: She understands that she needs to follow-up if symptoms persist greater than 3 weeks.  Meds ordered this encounter  Medications  . HYDROcodone-acetaminophen (NORCO)  10-325 MG tablet    Sig: Take 1 tablet by mouth every 6 (six) hours as needed for up to 5 days for moderate pain or severe pain.    Dispense:  20 tablet    Refill:  0   Medications Discontinued During This Encounter  Medication Reason  . Continuous Blood Gluc Sensor (FREESTYLE LIBRE 2 SENSOR) MISC Duplicate  . azithromycin (ZITHROMAX) 250 MG tablet Completed Course  . doxycycline (PERIOSTAT) 20 MG tablet Completed Course   Orders Placed This Encounter  Procedures  . DG Foot Complete Left  . DG Wrist Complete Left  . DG Wrist Complete Right  . DG Ankle Complete Right    Signed,  Dafna Romo T. Xzavien Harada, MD   Outpatient Encounter Medications as of 06/05/2020  Medication  Sig  . ASPERCREME LIDOCAINE EX Apply 1 application topically 4 (four) times daily as needed (for pain.).   Marland Kitchen aspirin 325 MG tablet Take 325 mg by mouth every 4 (four) hours as needed for headache.   . Azelastine HCl 137 MCG/SPRAY SOLN PLACE 2 SPRAYS INTO BOTH NOSTRILS 2 (TWO) TIMES DAILY  . BIOTIN PO Take 200 mg by mouth daily.   . cetirizine (ZYRTEC) 10 MG tablet Take 10 mg by mouth 2 (two) times daily.  . Cholecalciferol (VITAMIN D3) 10000 units capsule Take 30,000 Units by mouth daily.   . ciclopirox (LOPROX) 0.77 % cream Apply topically 2 (two) times daily.  . clobetasol (TEMOVATE) 0.05 % external solution Mix clobetasol solution with  CeraVe cream Use twice daily to affected areas.Avoid Face, groin and underarm  . Continuous Blood Gluc Receiver (FREESTYLE LIBRE 2 READER) DEVI Use as instruct to test blood sugar daily  . Continuous Blood Gluc Receiver (FREESTYLE LIBRE READER) DEVI Use receiver daily to check blood sugar  . Continuous Blood Gluc Sensor (FREESTYLE LIBRE 14 DAY SENSOR) MISC PLACE INTO SKIN FOR CONTINUOUS CHECK OF BLOOD SUGAR DAILY  . cyanocobalamin (,VITAMIN B-12,) 1000 MCG/ML injection Inject 1 mL (1,000 mcg total) into the muscle every 3 (three) months.  . desipramine (NORPRAMIN) 25 MG tablet TAKE 1  TABLET BY MOUTH IN THE MORNING  . diclofenac sodium (VOLTAREN) 1 % GEL Apply 4 g topically 4 (four) times daily.  Marland Kitchen docusate sodium (COLACE) 100 MG capsule Take 1 capsule (100 mg total) by mouth 2 (two) times daily.  . DULoxetine (CYMBALTA) 20 MG capsule Take 2 capsules (40 mg total) by mouth daily.  . Evolocumab (REPATHA SURECLICK) 998 MG/ML SOAJ Inject 1 Dose into the skin every 14 (fourteen) days.  . famotidine (PEPCID) 20 MG tablet TAKE 1 TABLET BY MOUTH EVERYDAY AT BEDTIME  . fluocinonide (LIDEX) 0.05 % external solution Apply 1 application topically 2 (two) times daily as needed. Apply to scalp twice daily as need for itch  . fluticasone furoate-vilanterol (BREO ELLIPTA) 100-25 MCG/INH AEPB Inhale 1 puff into the lungs daily.  . hydrocortisone 2.5 % cream Apply BID to affected area in underarms and groin prn flares  . hydroquinone 4 % cream 1 APPLICATION TOPICALLY DAILY AS NEEDED FOR BLEMISHES.  . hydrOXYzine (VISTARIL) 50 MG capsule TAKE 1 CAPSLE BY MOUTH AT NOON, 1 AT 6 PM, AND 1 AS NEEDED  . lamoTRIgine (LAMICTAL) 150 MG tablet TAKE 2 TABLETS BY MOUTH AT BEDTIME  . levalbuterol (XOPENEX HFA) 45 MCG/ACT inhaler Inhale 1-2 puffs into the lungs every 6 (six) hours as needed for wheezing.  . levalbuterol (XOPENEX) 1.25 MG/3ML nebulizer solution USE 1 VIAL VIA NEBULIZER EVERY 4 HOURS AS NEEDED FOR WHEEZING OR SHORTNESS OF BREATH  . Lysine 1000 MG TABS Take 2,000-4,000 mg by mouth at bedtime. 2000 mg scheduled at bedtime and patient will take 4000 mg if she has outbreak  . Magnesium 500 MG TABS Take 1 tablet by mouth at bedtime.  . metFORMIN (GLUCOPHAGE-XR) 500 MG 24 hr tablet Take 1 tablet (500 mg total) by mouth daily with breakfast. For diabetes.  . metoprolol succinate (TOPROL-XL) 50 MG 24 hr tablet Take 0.5 tablets (25 mg total) by mouth daily. Take with or immediately following a meal.  . neomycin-bacitracin-polymyxin (NEOSPORIN) [phone] ointment Apply 1 application topically 4 (four)  times daily as needed (for cut/scrapes.).  Marland Kitchen pantoprazole (PROTONIX) 40 MG tablet Take 1 tablet (40 mg total) by mouth daily.  . phenazopyridine (AZO-TABS)  95 MG tablet Take 95 mg by mouth 3 (three) times daily as needed for pain.  . polyethylene glycol powder (GLYCOLAX/MIRALAX) powder MIX 17 GRAMS (1 CAPFUL) WITH 4-8 OZ OF LIQUID AND TAKE BY MOUTH TWICE DAILY AS NEEDED  . temazepam (RESTORIL) 30 MG capsule TAKE ONE TO TWO CAPSULES BY MOUTH NIGHTLY AT BEDTIME  . tiZANidine (ZANAFLEX) 4 MG tablet TAKE 2 TABLETS (8 MG TOTAL) BY MOUTH EVERY 6 (SIX) HOURS AS NEEDED FOR MUSCLE SPASMS.  Marland Kitchen traZODone (DESYREL) 50 MG tablet Take 2 tablets (100 mg total) by mouth at bedtime.  Marland Kitchen HYDROcodone-acetaminophen (NORCO) 10-325 MG tablet Take 1 tablet by mouth every 6 (six) hours as needed for up to 5 days for moderate pain or severe pain.  . [DISCONTINUED] azithromycin (ZITHROMAX) 250 MG tablet Take 250 mg by mouth as directed.  . [DISCONTINUED] Continuous Blood Gluc Sensor (FREESTYLE LIBRE 2 SENSOR) MISC Use as instruct to test blood sugar daily  . [DISCONTINUED] doxycycline (PERIOSTAT) 20 MG tablet Take 20 mg by mouth 2 (two) times daily.   No facility-administered encounter medications on file as of 06/05/2020.     Follow-up: No follow-ups on file.  Meds ordered this encounter  Medications  . HYDROcodone-acetaminophen (NORCO) 10-325 MG tablet    Sig: Take 1 tablet by mouth every 6 (six) hours as needed for up to 5 days for moderate pain or severe pain.    Dispense:  20 tablet    Refill:  0   Medications Discontinued During This Encounter  Medication Reason  . Continuous Blood Gluc Sensor (FREESTYLE LIBRE 2 SENSOR) MISC Duplicate  . azithromycin (ZITHROMAX) 250 MG tablet Completed Course  . doxycycline (PERIOSTAT) 20 MG tablet Completed Course   Orders Placed This Encounter  Procedures  . DG Foot Complete Left  . DG Wrist Complete Left  . DG Wrist Complete Right  . DG Ankle Complete Right     Signed,  Cloie Wooden T. Smriti Barkow, MD   Outpatient Encounter Medications as of 06/05/2020  Medication Sig  . ASPERCREME LIDOCAINE EX Apply 1 application topically 4 (four) times daily as needed (for pain.).   Marland Kitchen aspirin 325 MG tablet Take 325 mg by mouth every 4 (four) hours as needed for headache.   . Azelastine HCl 137 MCG/SPRAY SOLN PLACE 2 SPRAYS INTO BOTH NOSTRILS 2 (TWO) TIMES DAILY  . BIOTIN PO Take 200 mg by mouth daily.   . cetirizine (ZYRTEC) 10 MG tablet Take 10 mg by mouth 2 (two) times daily.  . Cholecalciferol (VITAMIN D3) 10000 units capsule Take 30,000 Units by mouth daily.   . ciclopirox (LOPROX) 0.77 % cream Apply topically 2 (two) times daily.  . clobetasol (TEMOVATE) 0.05 % external solution Mix clobetasol solution with  CeraVe cream Use twice daily to affected areas.Avoid Face, groin and underarm  . Continuous Blood Gluc Receiver (FREESTYLE LIBRE 2 READER) DEVI Use as instruct to test blood sugar daily  . Continuous Blood Gluc Receiver (FREESTYLE LIBRE READER) DEVI Use receiver daily to check blood sugar  . Continuous Blood Gluc Sensor (FREESTYLE LIBRE 14 DAY SENSOR) MISC PLACE INTO SKIN FOR CONTINUOUS CHECK OF BLOOD SUGAR DAILY  . cyanocobalamin (,VITAMIN B-12,) 1000 MCG/ML injection Inject 1 mL (1,000 mcg total) into the muscle every 3 (three) months.  . desipramine (NORPRAMIN) 25 MG tablet TAKE 1 TABLET BY MOUTH IN THE MORNING  . diclofenac sodium (VOLTAREN) 1 % GEL Apply 4 g topically 4 (four) times daily.  Marland Kitchen docusate sodium (COLACE) 100 MG capsule Take  1 capsule (100 mg total) by mouth 2 (two) times daily.  . DULoxetine (CYMBALTA) 20 MG capsule Take 2 capsules (40 mg total) by mouth daily.  . Evolocumab (REPATHA SURECLICK) 010 MG/ML SOAJ Inject 1 Dose into the skin every 14 (fourteen) days.  . famotidine (PEPCID) 20 MG tablet TAKE 1 TABLET BY MOUTH EVERYDAY AT BEDTIME  . fluocinonide (LIDEX) 0.05 % external solution Apply 1 application topically 2 (two) times daily as  needed. Apply to scalp twice daily as need for itch  . fluticasone furoate-vilanterol (BREO ELLIPTA) 100-25 MCG/INH AEPB Inhale 1 puff into the lungs daily.  . hydrocortisone 2.5 % cream Apply BID to affected area in underarms and groin prn flares  . hydroquinone 4 % cream 1 APPLICATION TOPICALLY DAILY AS NEEDED FOR BLEMISHES.  . hydrOXYzine (VISTARIL) 50 MG capsule TAKE 1 CAPSLE BY MOUTH AT NOON, 1 AT 6 PM, AND 1 AS NEEDED  . lamoTRIgine (LAMICTAL) 150 MG tablet TAKE 2 TABLETS BY MOUTH AT BEDTIME  . levalbuterol (XOPENEX HFA) 45 MCG/ACT inhaler Inhale 1-2 puffs into the lungs every 6 (six) hours as needed for wheezing.  . levalbuterol (XOPENEX) 1.25 MG/3ML nebulizer solution USE 1 VIAL VIA NEBULIZER EVERY 4 HOURS AS NEEDED FOR WHEEZING OR SHORTNESS OF BREATH  . Lysine 1000 MG TABS Take 2,000-4,000 mg by mouth at bedtime. 2000 mg scheduled at bedtime and patient will take 4000 mg if she has outbreak  . Magnesium 500 MG TABS Take 1 tablet by mouth at bedtime.  . metFORMIN (GLUCOPHAGE-XR) 500 MG 24 hr tablet Take 1 tablet (500 mg total) by mouth daily with breakfast. For diabetes.  . metoprolol succinate (TOPROL-XL) 50 MG 24 hr tablet Take 0.5 tablets (25 mg total) by mouth daily. Take with or immediately following a meal.  . neomycin-bacitracin-polymyxin (NEOSPORIN) [phone] ointment Apply 1 application topically 4 (four) times daily as needed (for cut/scrapes.).  Marland Kitchen pantoprazole (PROTONIX) 40 MG tablet Take 1 tablet (40 mg total) by mouth daily.  . phenazopyridine (AZO-TABS) 95 MG tablet Take 95 mg by mouth 3 (three) times daily as needed for pain.  . polyethylene glycol powder (GLYCOLAX/MIRALAX) powder MIX 17 GRAMS (1 CAPFUL) WITH 4-8 OZ OF LIQUID AND TAKE BY MOUTH TWICE DAILY AS NEEDED  . temazepam (RESTORIL) 30 MG capsule TAKE ONE TO TWO CAPSULES BY MOUTH NIGHTLY AT BEDTIME  . tiZANidine (ZANAFLEX) 4 MG tablet TAKE 2 TABLETS (8 MG TOTAL) BY MOUTH EVERY 6 (SIX) HOURS AS NEEDED FOR MUSCLE SPASMS.    Marland Kitchen traZODone (DESYREL) 50 MG tablet Take 2 tablets (100 mg total) by mouth at bedtime.  Marland Kitchen HYDROcodone-acetaminophen (NORCO) 10-325 MG tablet Take 1 tablet by mouth every 6 (six) hours as needed for up to 5 days for moderate pain or severe pain.  . [DISCONTINUED] azithromycin (ZITHROMAX) 250 MG tablet Take 250 mg by mouth as directed.  . [DISCONTINUED] Continuous Blood Gluc Sensor (FREESTYLE LIBRE 2 SENSOR) MISC Use as instruct to test blood sugar daily  . [DISCONTINUED] doxycycline (PERIOSTAT) 20 MG tablet Take 20 mg by mouth 2 (two) times daily.   No facility-administered encounter medications on file as of 06/05/2020.

## 2020-06-06 ENCOUNTER — Encounter: Payer: Self-pay | Admitting: Family Medicine

## 2020-06-07 DIAGNOSIS — M5441 Lumbago with sciatica, right side: Secondary | ICD-10-CM | POA: Diagnosis not present

## 2020-06-07 DIAGNOSIS — M5442 Lumbago with sciatica, left side: Secondary | ICD-10-CM | POA: Diagnosis not present

## 2020-06-09 ENCOUNTER — Other Ambulatory Visit (HOSPITAL_COMMUNITY): Payer: Self-pay | Admitting: Psychiatry

## 2020-06-09 ENCOUNTER — Encounter: Payer: Self-pay | Admitting: Family Medicine

## 2020-06-09 DIAGNOSIS — L409 Psoriasis, unspecified: Secondary | ICD-10-CM

## 2020-06-10 ENCOUNTER — Encounter: Payer: Self-pay | Admitting: Family Medicine

## 2020-06-10 MED ORDER — CALCIPOTRIENE-BETAMETH DIPROP 0.005-0.064 % EX OINT: TOPICAL_OINTMENT | Freq: Every day | CUTANEOUS | 0 refills | Status: DC

## 2020-06-10 MED ORDER — HYDROCODONE-ACETAMINOPHEN 10-325 MG PO TABS: 1.0000 | ORAL_TABLET | Freq: Four times a day (QID) | ORAL | 0 refills | Status: AC | PRN

## 2020-06-10 MED ORDER — HYDROCODONE-ACETAMINOPHEN 10-325 MG PO TABS: 1.0000 | ORAL_TABLET | Freq: Four times a day (QID) | ORAL | 0 refills | Status: DC | PRN

## 2020-06-10 NOTE — Telephone Encounter (Signed)
Thanks for seeing her.  Yes, please do avoid any further refills of her hydrocodone if not required, it took me a very long time to get her off. She was asking me for some during our last visit and I denied her request. This was prior to her falls.

## 2020-06-11 ENCOUNTER — Ambulatory Visit: Payer: PPO | Admitting: Physical Therapy

## 2020-06-12 ENCOUNTER — Telehealth (INDEPENDENT_AMBULATORY_CARE_PROVIDER_SITE_OTHER): Payer: PPO | Admitting: Psychiatry

## 2020-06-12 ENCOUNTER — Other Ambulatory Visit: Payer: Self-pay

## 2020-06-12 DIAGNOSIS — F332 Major depressive disorder, recurrent severe without psychotic features: Secondary | ICD-10-CM

## 2020-06-12 MED ORDER — TEMAZEPAM 30 MG PO CAPS: ORAL_CAPSULE | ORAL | 4 refills | Status: DC

## 2020-06-12 MED ORDER — DULOXETINE HCL 20 MG PO CPEP: 40.0000 mg | ORAL_CAPSULE | Freq: Every day | ORAL | 5 refills | Status: DC

## 2020-06-12 MED ORDER — HYDROXYZINE PAMOATE 50 MG PO CAPS: ORAL_CAPSULE | ORAL | 2 refills | Status: DC

## 2020-06-12 MED ORDER — TRAZODONE HCL 50 MG PO TABS: 100.0000 mg | ORAL_TABLET | Freq: Every day | ORAL | 7 refills | Status: DC

## 2020-06-12 MED ORDER — LAMOTRIGINE 150 MG PO TABS: 300.0000 mg | ORAL_TABLET | Freq: Every day | ORAL | 1 refills | Status: DC

## 2020-06-12 NOTE — Progress Notes (Signed)
The most painful.  This  Psychiatric Initial Adult Assessment   Patient Identification: Emily Moon MRN:  [id number] Date of Evaluation:  06/12/2020 Referral Source: From the community Chief Complaint: Feels exhausted     Today patient seems to be somewhat worse.  She has spinal stenosis and a lot of pain from it.  She also had 2 falls.  The patient's mood is fairly stable.  She sleeps fairly well as long she takes her Restoril.  Unfortunately she has gained a lot of weight.  According to her she gained over 50 pounds.  She says she has a cycle of having nothing to do having a lot of pain and finds her self eating all the time.  On the other hand she says she has specialized foods delivered to her.  She says she has a very wealthy friend who financially supports her.  Patient drinks no alcohol uses no drugs.  She did speak of her MS at this time.  She looks forward to time just speaking with me.  Clearly is a psychotherapeutic events when we speak.  She continues taking all medication as prescribed and wishes not to change anything.  I do suspect the Cymbalta is somewhat helpful.  She only takes 40 mg and she says a higher dose makes her hyper.  Her anxiety is fairly well controlled.  Patient: Emily Moon  Procedure(s) Performed: * No surgery found *  Anesthes  Patient location:   Post pain:   Post assessment:   Last Vitals:  There were no vitals filed for this visit.  Post vital signs:   Level of consciousness:   Complications: . Associated Signs/Symptoms: Depression Symptoms:  hopelessness, (Hypo) Manic Symptoms:   Anxiety Symptoms:   Psychotic Symptoms:   PTSD Symptoms:   Past Psychiatric History: The patient was seen in a psychiatric office and takes multiple medications she's not been recently hospitalized.  Previous Psychotropic Medications: Yes   Substance Abuse History in the last 12 months:  No.  Consequences of Substance Abuse: Negative  Past  Medical History:  Past Medical History:  Diagnosis Date  . Arthritis    osteo  . Asthma   . Bipolar disorder ([place]) 05/21/14  . Cataracts, bilateral   . DDD (degenerative disc disease), cervical    also back  . Depression   . Dizziness    Positional  . Edema    feet/legs  . Fibromyalgia syndrome   . Fungal infection    Finger nails  . GERD (gastroesophageal reflux disease)   . Gout   . Headache    seasonal allergies  . Heart palpitations   . 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 Hypercholesterolemia   . Multiple sclerosis (HCC)    weakness  . Nephrolithiasis    kidney stones  . Osteoporosis    osteoarthritis  . Pneumonia   . PONV (postoperative nausea and vomiting)    no problem after cataract surgery  . Psoriasis   . Renal stone   . Shortness of breath dyspnea    wheezing  . Sleep apnea 2012   sleep study / slight, no interventions  . Urinary frequency     Past Surgical History:  Procedure Laterality Date  . CATARACT EXTRACTION W/PHACO Left 05/21/2015   Procedure: CATARACT EXTRACTION PHACO AND INTRAOCULAR LENS PLACEMENT (IOC);  Surgeon: Birder Robson, MD;  Location: [facility];  Service: Ophthalmology;  Laterality: Left;  [place] 00:35 AP% 22.9 CDE 8.11 fluid pack lot #[id number]  . CATARACT  EXTRACTION W/PHACO Right 06/04/2015   Procedure: CATARACT EXTRACTION PHACO AND INTRAOCULAR LENS PLACEMENT (IOC);  Surgeon: Birder Robson, MD;  Location: [facility];  Service: Ophthalmology;  Laterality: Right;  US:00:48 AP%: 10.5 CDE:5.08 Fluid lot #[id number]  . CYSTOSCOPY/URETEROSCOPY/HOLMIUM LASER/STENT PLACEMENT Bilateral 09/22/2016   Procedure: CYSTOSCOPY/URETEROSCOPY/HOLMIUM LASER/STENT PLACEMENT;  Surgeon: Hollice Espy, MD;  Location: [facility];  Service: Urology;  Laterality: Bilateral;  . EYE SURGERY  2015   tissue biopsy  . FOOT SURGERY  2015  . JOINT REPLACEMENT Left 2013   hip replacement  . LITHOTRIPSY    . PTOSIS REPAIR Bilateral 02/18/2016   Procedure: BILATERAL  PTOSIS REPAIR UPPER EYELIDS;  Surgeon: Karle Starch, MD;  Location: [place];  Service: Ophthalmology;  Laterality: Bilateral;  LEAVE PT EARLY AM  . thumb surgery Right   . TONSILLECTOMY  1973    Family Psychiatric History:   Family History:  Family History  Problem Relation Age of Onset  . Cancer Father        Abdomen with mastasis  . Cancer Mother   . Heart disease Mother   . Kidney disease Neg Hx   . Bladder Cancer Neg Hx   . Prostate cancer Neg Hx   . Kidney cancer Neg Hx     Social History:   Social History   Socioeconomic History  . Marital status: Divorced    Spouse name: Not on file  . Number of children: 1  . Years of education: Not on file  . Highest education level: Not on file  Occupational History  . 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 Smoking status: Former Smoker    Years: 25.00    Types: Cigarettes    Quit date: 11/16/2012    Years since quitting: 7.5  . Smokeless tobacco: Never Used  . Tobacco comment: occasional use  Vaping Use  . Vaping Use: Former  Substance and Sexual Activity  . Alcohol use: No    Alcohol/week: 0.0 standard drinks  . Drug use: No    Types: Methylphenidate  . Sexual activity: Never  Other Topics Concern  . Not on file  Social History Narrative  . Not on file   Social Determinants of Health   Financial Resource Strain: Low Risk   . Difficulty of Paying Living Expenses: Not hard at all  Food Insecurity: No Food Insecurity  . Worried About Charity fundraiser in the Last Year: Never true  . Ran Out of Food in the Last Year: Never true  Transportation Needs: No Transportation Needs  . Lack of Transportation (Medical): No  . Lack of Transportation (Non-Medical): No  Physical Activity: Inactive  . Days of Exercise per Week: 0 days  . Minutes of Exercise per Session: 0 min  Stress: Stress Concern Present  . Feeling of Stress : Very much  Social Connections:   .  Frequency of Communication with Friends and Family:   . Frequency of Social Gatherings with Friends and Family:   . Attends Religious Services:   . Active Member of Clubs or Organizations:   . Attends Archivist Meetings:   Marland Kitchen Marital Status:     Additional Social History:   Allergies:   Allergies  Allergen Reactions  . Albuterol Shortness Of Breath and Other (See Comments)    Makes pt feel jittery/ tacycardic  . Crestor [Rosuvastatin] Other (See Comments)    Joint pain, muscle pain, and hair loss  .  Halcion [Triazolam] Other (See Comments)    Dizziness,headaches,bladder problems  . Levaquin [Levofloxacin In D5w] Diarrhea and Itching    Shoulder pain  . Naproxen Sodium Swelling    Patient tolerates in small doses  . Tylenol [Acetaminophen] Swelling    Patient tolerates in small doses  . Cefaclor Other (See Comments)    Doesn't remember---unsure if actually allergic   . Diclofenac Sodium Other (See Comments)    "made very sick"  . Sulfa Antibiotics Itching    Unsure of reaction possibly itching Unsure of reaction possibly itching  . Tramadol Itching and Nausea And Vomiting  . Aripiprazole Other (See Comments)    Muscle tension/cramping  . Diclofenac Sodium Rash    "made very sick"  . Ibuprofen Swelling    Patient tolerates in small doses    Metabolic Disorder Labs: Lab Results  Component Value Date   HGBA1C 6.9 (H) 04/30/2020   No results found for: PROLACTIN Lab Results  Component Value Date   CHOL 225 (H) 04/30/2020   TRIG 205.0 (H) 04/30/2020   HDL 64.90 04/30/2020   CHOLHDL 3 04/30/2020   VLDL 41.0 (H) 04/30/2020   LDLCALC 223 (H) 11/13/2014     Current Medications: Current Outpatient Medications  Medication Sig Dispense Refill  . ASPERCREME LIDOCAINE EX Apply 1 application topically 4 (four) times daily as needed (for pain.).     Marland Kitchen aspirin 325 MG tablet Take 325 mg by mouth every 4 (four) hours as needed for headache.     . Azelastine HCl  137 MCG/SPRAY SOLN PLACE 2 SPRAYS INTO BOTH NOSTRILS 2 (TWO) TIMES DAILY 30 mL 1  . BIOTIN PO Take 200 mg by mouth daily.     . calcipotriene-betamethasone (TACLONEX) ointment Apply topically daily. 60 g 0  . cetirizine (ZYRTEC) 10 MG tablet Take 10 mg by mouth 2 (two) times daily.    . Cholecalciferol (VITAMIN D3) 10000 units capsule Take 30,000 Units by mouth daily.     . ciclopirox (LOPROX) 0.77 % cream Apply topically 2 (two) times daily. 30 g 0  . clobetasol (TEMOVATE) 0.05 % external solution Mix clobetasol solution with  CeraVe cream Use twice daily to affected areas.Avoid Face, groin and underarm 50 mL 0  . Continuous Blood Gluc Receiver (FREESTYLE LIBRE 2 READER) DEVI Use as instruct to test blood sugar daily 2 each 1  . Continuous Blood Gluc Receiver (FREESTYLE LIBRE READER) DEVI Use receiver daily to check blood sugar 1 each 0  . Continuous Blood Gluc Sensor (FREESTYLE LIBRE 14 DAY SENSOR) MISC PLACE INTO SKIN FOR CONTINUOUS CHECK OF BLOOD SUGAR DAILY 2 each 2  . cyanocobalamin (,VITAMIN B-12,) 1000 MCG/ML injection Inject 1 mL (1,000 mcg total) into the muscle every 3 (three) months. 1 mL 0  . diclofenac sodium (VOLTAREN) 1 % GEL Apply 4 g topically 4 (four) times daily. 500 g 5  . docusate sodium (COLACE) 100 MG capsule Take 1 capsule (100 mg total) by mouth 2 (two) times daily. 60 capsule 0  . DULoxetine (CYMBALTA) 20 MG capsule Take 2 capsules (40 mg total) by mouth daily. 60 capsule 5  . Evolocumab (REPATHA SURECLICK) 130 MG/ML SOAJ Inject 1 Dose into the skin every 14 (fourteen) days. 2 pen 11  . famotidine (PEPCID) 20 MG tablet TAKE 1 TABLET BY MOUTH EVERYDAY AT BEDTIME 30 tablet 0  . fluocinonide (LIDEX) 0.05 % external solution Apply 1 application topically 2 (two) times daily as needed. Apply to scalp twice daily as need for  itch 180 mL 3  . fluticasone furoate-vilanterol (BREO ELLIPTA) 100-25 MCG/INH AEPB Inhale 1 puff into the lungs daily. 30 each 5  .  HYDROcodone-acetaminophen (NORCO) 10-325 MG tablet Take 1 tablet by mouth every 6 (six) hours as needed for up to 5 days for moderate pain or severe pain. 20 tablet 0  . hydrocortisone 2.5 % cream Apply BID to affected area in underarms and groin prn flares    . hydroquinone 4 % cream 1 APPLICATION TOPICALLY DAILY AS NEEDED FOR BLEMISHES.  3  . hydrOXYzine (VISTARIL) 50 MG capsule TAKE 1 CAPSLE BY MOUTH AT NOON, 1 AT 6 PM, AND 1 AS NEEDED 270 capsule 2  . lamoTRIgine (LAMICTAL) 150 MG tablet Take 2 tablets (300 mg total) by mouth at bedtime. 180 tablet 1  . levalbuterol (XOPENEX HFA) 45 MCG/ACT inhaler Inhale 1-2 puffs into the lungs every 6 (six) hours as needed for wheezing. 1 Inhaler 0  . levalbuterol (XOPENEX) 1.25 MG/3ML nebulizer solution USE 1 VIAL VIA NEBULIZER EVERY 4 HOURS AS NEEDED FOR WHEEZING OR SHORTNESS OF BREATH 270 mL 1  . Lysine 1000 MG TABS Take 2,000-4,000 mg by mouth at bedtime. 2000 mg scheduled at bedtime and patient will take 4000 mg if she has outbreak    . Magnesium 500 MG TABS Take 1 tablet by mouth at bedtime.    . metFORMIN (GLUCOPHAGE-XR) 500 MG 24 hr tablet Take 1 tablet (500 mg total) by mouth daily with breakfast. For diabetes. 90 tablet 1  . metoprolol succinate (TOPROL-XL) 50 MG 24 hr tablet Take 0.5 tablets (25 mg total) by mouth daily. Take with or immediately following a meal. 30 tablet 6  . neomycin-bacitracin-polymyxin (NEOSPORIN) [phone] ointment Apply 1 application topically 4 (four) times daily as needed (for cut/scrapes.).    Marland Kitchen pantoprazole (PROTONIX) 40 MG tablet Take 1 tablet (40 mg total) by mouth daily. 90 tablet 1  . phenazopyridine (AZO-TABS) 95 MG tablet Take 95 mg by mouth 3 (three) times daily as needed for pain.    . polyethylene glycol powder (GLYCOLAX/MIRALAX) powder MIX 17 GRAMS (1 CAPFUL) WITH 4-8 OZ OF LIQUID AND TAKE BY MOUTH TWICE DAILY AS NEEDED 527 g 0  . temazepam (RESTORIL) 30 MG capsule TAKE ONE TO TWO CAPSULES BY MOUTH NIGHTLY AT  BEDTIME 60 capsule 4  . tiZANidine (ZANAFLEX) 4 MG tablet TAKE 2 TABLETS (8 MG TOTAL) BY MOUTH EVERY 6 (SIX) HOURS AS NEEDED FOR MUSCLE SPASMS. 240 tablet 0  . traZODone (DESYREL) 50 MG tablet Take 2 tablets (100 mg total) by mouth at bedtime. 60 tablet 7   No current facility-administered medications for this visit.    Neurologic: Headache: No Seizure: No Paresthesias:No  Musculoskeletal: Strength & Muscle Tone: within normal limits Gait & Station: normal Patient leans: N/A  Psychiatric Specialty Exam: ROS  Last menstrual period 08/23/2014.There is no height or weight on file to calculate BMI.  General Appearance: Casual  Eye Contact:  Good  Speech:  Clear and Coherent  Volume:  Normal  Mood:  Negative  Affect:  Appropriate  Thought Process:  Goal Directed  Orientation:  NA  Thought Content:  Logical  Suicidal Thoughts:  No  Homicidal Thoughts:  No  Memory:  Negative  Judgement:  Good  Insight:  Good  Psychomotor Activity:  Normal  Concentration:    Recall:    Fund of Knowledge:Good  Language: Good  Akathisia:  No  Handed:  Right  AIMS (if indicated):    Assets:  Desire  for Improvement  ADL's:  Intact  Cognition: WNL  Sleep:      7/28/20213:19 PM   At this time the patient's major problem seems to be major depression.  On her last visit she had complained of tachycardia.  We asked her to stop her desipramine and see if her heart rate normalized.  She stopped the desipramine her heart rate did not normalize and she went on to see a cardiologist who started her on a beta-blocker.  For reasons that are not clear she did not restart the desipramine.  Therefore she is taking for depression only the Cymbalta.  She also takes Lamictal which helps her irritability.  Her second problem is that of a sleep disorder.  She has been told that she has an obstructive sleep disorder.  For me she continues taking Restoril.  Her third problem seems to be generalized anxiety disorder.   For this she takes Vistaril on a regular basis and it does seem to help.  I do believe this patient has a characterological issue and I think it produces levels of irritability.  I think she is a complex presentation.  I think she has major depression, generalized anxiety disorder or some form of an anxiety disorder and insomnia.  She will return to talk to me again in 3 months.  At this time she is not in psychotherapy.

## 2020-06-13 ENCOUNTER — Ambulatory Visit: Payer: PPO | Admitting: Physical Therapy

## 2020-06-17 ENCOUNTER — Encounter: Payer: PPO | Admitting: Physical Therapy

## 2020-06-19 ENCOUNTER — Ambulatory Visit (INDEPENDENT_AMBULATORY_CARE_PROVIDER_SITE_OTHER)
Admission: RE | Admit: 2020-06-19 | Discharge: 2020-06-19 | Disposition: A | Discharge status: Home / Self Care | Payer: PPO | Source: Ambulatory Visit | Attending: Family Medicine | Admitting: Family Medicine

## 2020-06-19 ENCOUNTER — Other Ambulatory Visit: Payer: Self-pay

## 2020-06-19 ENCOUNTER — Encounter: Payer: Self-pay | Admitting: Family Medicine

## 2020-06-19 ENCOUNTER — Ambulatory Visit (INDEPENDENT_AMBULATORY_CARE_PROVIDER_SITE_OTHER): Payer: PPO | Admitting: Family Medicine

## 2020-06-19 VITALS — BP 110/70 | HR 102 | Temp 99.1°F | Ht 69.0 in | Wt 241.8 lb

## 2020-06-19 DIAGNOSIS — M898X1 Other specified disorders of bone, shoulder: Secondary | ICD-10-CM | POA: Diagnosis not present

## 2020-06-19 DIAGNOSIS — M19012 Primary osteoarthritis, left shoulder: Secondary | ICD-10-CM | POA: Diagnosis not present

## 2020-06-19 DIAGNOSIS — S43102A Unspecified dislocation of left acromioclavicular joint, initial encounter: Secondary | ICD-10-CM

## 2020-06-19 IMAGING — DX DG CLAVICLE*L*
2 series · 2 of 2 positions shown · non-contrast
Comparison: Chest radiograph [DATE], [DATE]

CLINICAL DATA: Left shoulder pain with fall 2 years prior

EXAM:
LEFT CLAVICLE - 2+ VIEWS

[clavicle ap]
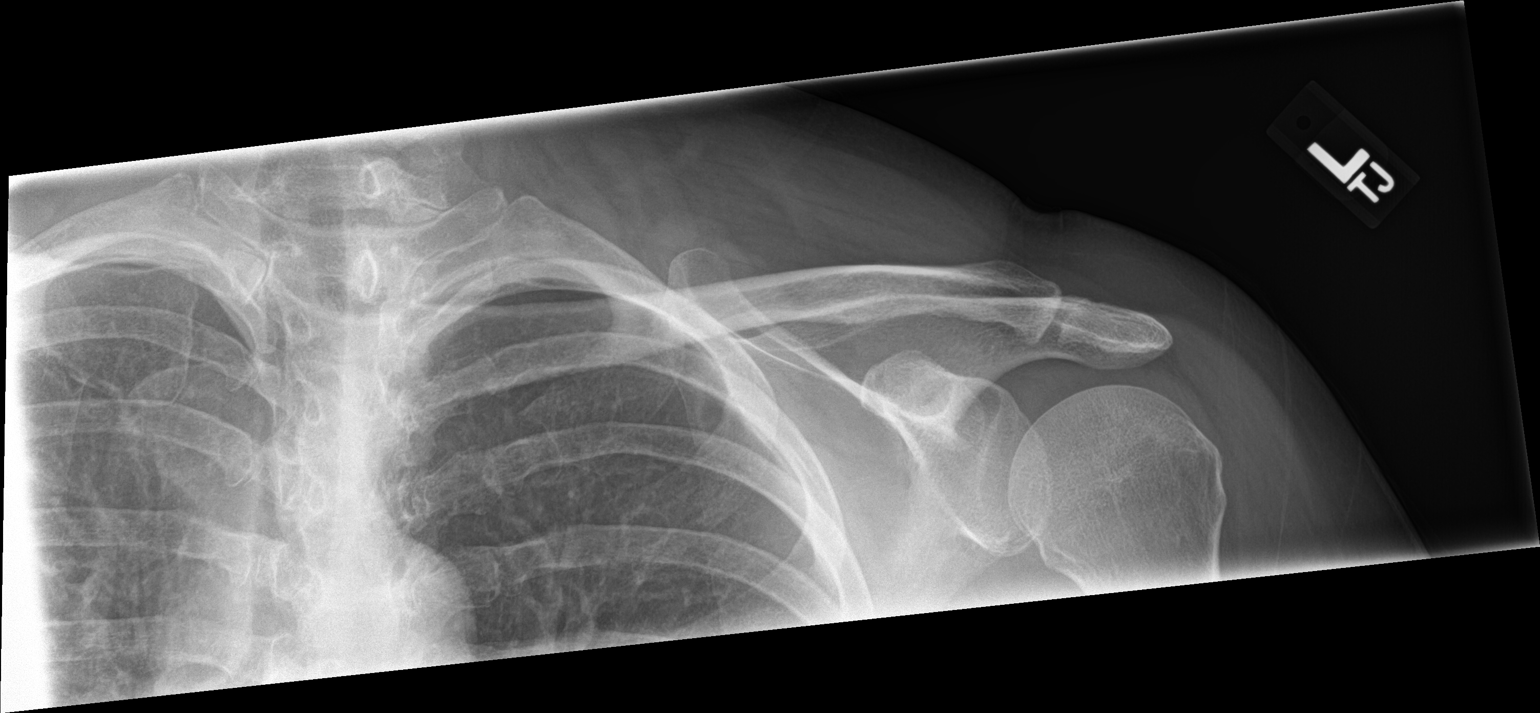

[clavicle axial]
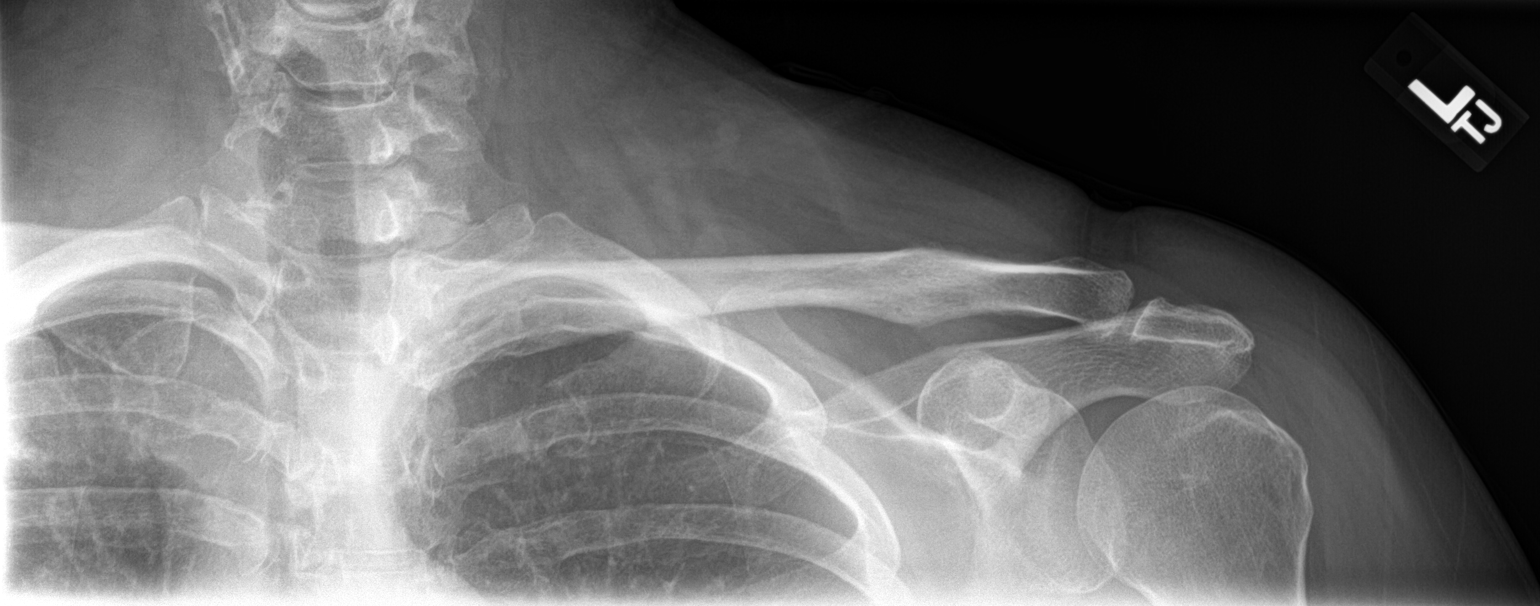

[2 of 2 positions shown; findings below may reference images not displayed]

FINDINGS: Clavicle is intact. No acute or remote posttraumatic deformity.
Minimal arthrosis of the acromioclavicular joint. Included portions
of the chest and left glenohumeral articulation are unremarkable.
Soft tissues are free of acute abnormality.
IMPRESSION: Minimal arthrosis of the acromioclavicular joint. No acute or remote
posttraumatic deformity of the clavicle.

## 2020-06-19 MED ORDER — HYDROCODONE-ACETAMINOPHEN 10-325 MG PO TABS: 1.0000 | ORAL_TABLET | Freq: Four times a day (QID) | ORAL | 0 refills | Status: AC | PRN

## 2020-06-19 NOTE — Progress Notes (Signed)
Cassidy Tabet T. Jerrye Seebeck, MD, [place]  [facility] and [place] at [facility] [place], [address]  Phone: [phone]  FAX: [place] - 59 y.o. female  MRN [id number]  Date of Birth: 11-Apr-1961  Date: 06/19/2020  PCP: [name], NP  Referral: [name], NP  Chief Complaint  Patient presents with  . Clavical Pain    Left    This visit occurred during the SARS-CoV-2 public health emergency.  Safety protocols were in place, including screening questions prior to the visit, additional usage of staff PPE, and extensive cleaning of exam room while observing appropriate contact time as indicated for disinfecting solutions.   Subjective:   Emily Moon is a 59 y.o. very pleasant female patient with Body mass index is 35.7 kg/m. who presents with the following:  L anterior shoulder pain: I saw her on June 05, 2020.  Reference my notes for this evaluation and the patient had 2 falls with significant traumas involving much of her body.  At this point she has primarily pain at the [facility] joint as well as the anterior shoulder.  This is isolated now and this is the primary point of pain.  She is having trouble abducting the arm as well as reaching across her body.  There is not been any bruising or swelling in this region.  Review of Systems is noted in the HPI, as appropriate   Objective:   BP 110/70   Pulse (!) 102   Temp 99.1 F (37.3 C) (Oral)   Ht 5\' 9"  (1.753 m)   Wt 241 lb 12 oz (109.7 kg)   LMP 08/23/2014   SpO2 97%   BMI 35.70 kg/m    GEN: No acute distress; alert,appropriate. PULM: Breathing comfortably in no respiratory distress PSYCH: Normally interactive.    Left shoulder with some limitation in active motion to approximately 115, but passive motion is full, but with pain.  Flexion is also limited, but increased with passive movement.  Minimal pain in the  bicipital groove, but the patient does have significant pain at the [facility] joint as well as anterior to this and inferior.  She has less pain at the caudal and lateral pec muscles.  Additional special testing for rotator cuff pathology is impossible.  Crossover causes severe pain.  Strength testing in all movements of the shoulder is 5/5 and she is neurovascularly intact.  Radiology: DG Clavicle Left  Result Date: 06/19/2020 CLINICAL DATA:  Left shoulder pain with fall 2 years prior EXAM: LEFT CLAVICLE - 2+ VIEWS COMPARISON:  Chest radiograph 10/10/2016, 02/19/2020 FINDINGS: Clavicle is intact. No acute or remote posttraumatic deformity. Minimal arthrosis of the acromioclavicular joint. Included portions of the chest and left glenohumeral articulation are unremarkable. Soft tissues are free of acute abnormality. IMPRESSION: Minimal arthrosis of the acromioclavicular joint. No acute or remote posttraumatic deformity of the clavicle. Electronically Signed   By: Lovena Le M.D.   On: 06/19/2020 19:41   Assessment and Plan:     ICD-10-CM   1. AC separation, left, initial encounter  S43.102A   2. Pain of left clavicle  M89.8X1 DG Clavicle Left   New diagnosis, AC joint separation.  No fracture.  Acromioclavicular ligament certainly involved, and also think that the corococlavicular ligament is involved.  I would classify this is a type II shoulder separation given coracoclavicular ligament involvement.  Known AC joint arthropathy contributes with exacerbation.  Basic range of motion, and usually this will resolve with time and basic rehab.  From a trauma standpoint I think that it is reasonable to give her an additional 20 tablets of pain medication, but I do not plan to refill this in the future.  She knows to follow-up with me if she has persistent longstanding difficulties, but an additional 2 to 4 weeks would be likely significant improvement.  Follow-up: No follow-ups on file.  Meds  ordered this encounter  Medications  . HYDROcodone-acetaminophen (NORCO) 10-325 MG tablet    Sig: Take 1 tablet by mouth every 6 (six) hours as needed for up to 5 days for severe pain.    Dispense:  20 tablet    Refill:  0   There are no discontinued medications. Orders Placed This Encounter  Procedures  . DG Clavicle Left    Signed,  Jasen Hartstein T. Lash Matulich, MD   Outpatient Encounter Medications as of 06/19/2020  Medication Sig  . ASPERCREME LIDOCAINE EX Apply 1 application topically 4 (four) times daily as needed (for pain.).   Marland Kitchen aspirin 325 MG tablet Take 325 mg by mouth every 4 (four) hours as needed for headache.   . Azelastine HCl 137 MCG/SPRAY SOLN PLACE 2 SPRAYS INTO BOTH NOSTRILS 2 (TWO) TIMES DAILY  . BIOTIN PO Take 200 mg by mouth daily.   . calcipotriene-betamethasone (TACLONEX) ointment Apply topically daily.  . cetirizine (ZYRTEC) 10 MG tablet Take 10 mg by mouth 2 (two) times daily.  . Cholecalciferol (VITAMIN D3) 10000 units capsule Take 30,000 Units by mouth daily.   . ciclopirox (LOPROX) 0.77 % cream Apply topically 2 (two) times daily.  . clobetasol (TEMOVATE) 0.05 % external solution Mix clobetasol solution with  CeraVe cream Use twice daily to affected areas.Avoid Face, groin and underarm  . Continuous Blood Gluc Receiver (FREESTYLE LIBRE 2 READER) DEVI Use as instruct to test blood sugar daily  . Continuous Blood Gluc Receiver (FREESTYLE LIBRE READER) DEVI Use receiver daily to check blood sugar  . Continuous Blood Gluc Sensor (FREESTYLE LIBRE 14 DAY SENSOR) MISC PLACE INTO SKIN FOR CONTINUOUS CHECK OF BLOOD SUGAR DAILY  . cyanocobalamin (,VITAMIN B-12,) 1000 MCG/ML injection Inject 1 mL (1,000 mcg total) into the muscle every 3 (three) months.  . diclofenac sodium (VOLTAREN) 1 % GEL Apply 4 g topically 4 (four) times daily.  Marland Kitchen docusate sodium (COLACE) 100 MG capsule Take 1 capsule (100 mg total) by mouth 2 (two) times daily.  . DULoxetine (CYMBALTA) 20 MG capsule  Take 2 capsules (40 mg total) by mouth daily.  . Evolocumab (REPATHA SURECLICK) 381 MG/ML SOAJ Inject 1 Dose into the skin every 14 (fourteen) days.  . famotidine (PEPCID) 20 MG tablet TAKE 1 TABLET BY MOUTH EVERYDAY AT BEDTIME  . fluocinonide (LIDEX) 0.05 % external solution Apply 1 application topically 2 (two) times daily as needed. Apply to scalp twice daily as need for itch  . fluticasone furoate-vilanterol (BREO ELLIPTA) 100-25 MCG/INH AEPB Inhale 1 puff into the lungs daily.  . hydrocortisone 2.5 % cream Apply BID to affected area in underarms and groin prn flares  . hydroquinone 4 % cream 1 APPLICATION TOPICALLY DAILY AS NEEDED FOR BLEMISHES.  . hydrOXYzine (VISTARIL) 50 MG capsule TAKE 1 CAPSLE BY MOUTH AT NOON, 1 AT 6 PM, AND 1 AS NEEDED  . lamoTRIgine (LAMICTAL) 150 MG tablet Take 2 tablets (300 mg total) by mouth at bedtime.  . levalbuterol (XOPENEX HFA) 45 MCG/ACT inhaler Inhale 1-2 puffs into the  lungs every 6 (six) hours as needed for wheezing.  . levalbuterol (XOPENEX) 1.25 MG/3ML nebulizer solution USE 1 VIAL VIA NEBULIZER EVERY 4 HOURS AS NEEDED FOR WHEEZING OR SHORTNESS OF BREATH  . Lysine 1000 MG TABS Take 2,000-4,000 mg by mouth at bedtime. 2000 mg scheduled at bedtime and patient will take 4000 mg if she has outbreak  . Magnesium 500 MG TABS Take 1 tablet by mouth at bedtime.  . metFORMIN (GLUCOPHAGE-XR) 500 MG 24 hr tablet Take 1 tablet (500 mg total) by mouth daily with breakfast. For diabetes.  . metoprolol succinate (TOPROL-XL) 50 MG 24 hr tablet Take 0.5 tablets (25 mg total) by mouth daily. Take with or immediately following a meal.  . neomycin-bacitracin-polymyxin (NEOSPORIN) [phone] ointment Apply 1 application topically 4 (four) times daily as needed (for cut/scrapes.).  Marland Kitchen pantoprazole (PROTONIX) 40 MG tablet Take 1 tablet (40 mg total) by mouth daily.  . phenazopyridine (AZO-TABS) 95 MG tablet Take 95 mg by mouth 3 (three) times daily as needed for pain.  .  polyethylene glycol powder (GLYCOLAX/MIRALAX) powder MIX 17 GRAMS (1 CAPFUL) WITH 4-8 OZ OF LIQUID AND TAKE BY MOUTH TWICE DAILY AS NEEDED  . temazepam (RESTORIL) 30 MG capsule TAKE ONE TO TWO CAPSULES BY MOUTH NIGHTLY AT BEDTIME  . tiZANidine (ZANAFLEX) 4 MG tablet TAKE 2 TABLETS (8 MG TOTAL) BY MOUTH EVERY 6 (SIX) HOURS AS NEEDED FOR MUSCLE SPASMS.  Marland Kitchen traZODone (DESYREL) 50 MG tablet Take 2 tablets (100 mg total) by mouth at bedtime.  Marland Kitchen HYDROcodone-acetaminophen (NORCO) 10-325 MG tablet Take 1 tablet by mouth every 6 (six) hours as needed for up to 5 days for severe pain.   No facility-administered encounter medications on file as of 06/19/2020.

## 2020-06-20 ENCOUNTER — Encounter: Payer: PPO | Admitting: Physical Therapy

## 2020-06-20 ENCOUNTER — Other Ambulatory Visit: Payer: Self-pay | Admitting: Dermatology

## 2020-06-20 DIAGNOSIS — M545 Low back pain: Secondary | ICD-10-CM | POA: Diagnosis not present

## 2020-06-20 DIAGNOSIS — G8929 Other chronic pain: Secondary | ICD-10-CM | POA: Diagnosis not present

## 2020-06-20 DIAGNOSIS — M4310 Spondylolisthesis, site unspecified: Secondary | ICD-10-CM | POA: Diagnosis not present

## 2020-06-20 DIAGNOSIS — G894 Chronic pain syndrome: Secondary | ICD-10-CM | POA: Diagnosis not present

## 2020-06-20 DIAGNOSIS — L509 Urticaria, unspecified: Secondary | ICD-10-CM

## 2020-06-24 ENCOUNTER — Encounter: Payer: PPO | Admitting: Physical Therapy

## 2020-06-27 ENCOUNTER — Telehealth: Payer: Self-pay

## 2020-06-27 ENCOUNTER — Encounter: Payer: PPO | Admitting: Physical Therapy

## 2020-06-27 ENCOUNTER — Other Ambulatory Visit: Payer: Self-pay | Admitting: Dermatology

## 2020-06-27 DIAGNOSIS — L509 Urticaria, unspecified: Secondary | ICD-10-CM

## 2020-06-27 NOTE — Telephone Encounter (Signed)
noted 

## 2020-06-27 NOTE — Telephone Encounter (Signed)
"  Rejection Reason - Patient did not respond" [facility] said about 19 hours ago

## 2020-06-28 ENCOUNTER — Other Ambulatory Visit: Payer: Self-pay | Admitting: Primary Care

## 2020-06-28 DIAGNOSIS — L509 Urticaria, unspecified: Secondary | ICD-10-CM

## 2020-06-28 MED ORDER — FAMOTIDINE 20 MG PO TABS: ORAL_TABLET | ORAL | 0 refills | Status: DC

## 2020-06-28 NOTE — Telephone Encounter (Signed)
Emily Smoke, Ms Gurka sent you this message this afternoon.  Patient is aware you are out of the office today, but will respond next week.   My blood sugar is getting a little hard to control and I am having an oral herpes outbreak. I had steroid injections last month but I was still able to keep my sugar under control. I take 500 mg of Metformin HCL ER. I  do not want to go on insulin. 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 I use a prophylactic dose of Lysine. That usually works. I do rinse my mouth out thoroughly after I use the Breo. I need some suggestions. Please. I want to stop taking it and go to an inhaled aerosol if possible. Please advise. Have a great weekend   Thanks, [employer] routed to [place], NP to advise

## 2020-06-28 NOTE — Telephone Encounter (Signed)
Have not prescribed  Last OV (follow up ) with Allie Bossier on 04/30/2020 No future OV scheduled

## 2020-07-01 ENCOUNTER — Encounter: Payer: PPO | Admitting: Physical Therapy

## 2020-07-01 DIAGNOSIS — R05 Cough: Secondary | ICD-10-CM

## 2020-07-01 DIAGNOSIS — R059 Cough, unspecified: Secondary | ICD-10-CM

## 2020-07-01 MED ORDER — LEVALBUTEROL TARTRATE 45 MCG/ACT IN AERO: 1.0000 | INHALATION_SPRAY | Freq: Four times a day (QID) | RESPIRATORY_TRACT | 0 refills | Status: DC | PRN

## 2020-07-01 NOTE — Telephone Encounter (Signed)
Dr. Mortimer Fries, please see pt's email. Thanks.

## 2020-07-02 NOTE — Telephone Encounter (Signed)
That is fine to stop BREO .  May try Symbicort 80 2 puffs Twice daily with spacer #1  Add Spacer with inhaler.   Rinse mouth well.  F/up with PCP for cold sores and DM .   Please contact office for sooner follow up if symptoms do not improve or worsen or seek emergency care

## 2020-07-03 MED ORDER — BUDESONIDE-FORMOTEROL FUMARATE 80-4.5 MCG/ACT IN AERO
2.0000 | INHALATION_SPRAY | Freq: Two times a day (BID) | RESPIRATORY_TRACT | 6 refills | Status: DC
Start: 2020-07-03 — End: 2021-09-10

## 2020-07-04 ENCOUNTER — Encounter: Payer: PPO | Admitting: Physical Therapy

## 2020-07-08 ENCOUNTER — Encounter: Payer: PPO | Admitting: Physical Therapy

## 2020-07-10 ENCOUNTER — Other Ambulatory Visit (HOSPITAL_COMMUNITY): Payer: Self-pay | Admitting: Psychiatry

## 2020-07-11 ENCOUNTER — Encounter: Payer: PPO | Admitting: Physical Therapy

## 2020-07-12 ENCOUNTER — Ambulatory Visit: Payer: PPO | Attending: Neurology

## 2020-07-12 ENCOUNTER — Encounter: Payer: Self-pay | Admitting: Adult Health

## 2020-07-12 DIAGNOSIS — G4733 Obstructive sleep apnea (adult) (pediatric): Secondary | ICD-10-CM | POA: Diagnosis not present

## 2020-07-15 ENCOUNTER — Other Ambulatory Visit: Payer: Self-pay

## 2020-07-15 ENCOUNTER — Encounter: Payer: PPO | Admitting: Physical Therapy

## 2020-07-15 DIAGNOSIS — R21 Rash and other nonspecific skin eruption: Secondary | ICD-10-CM

## 2020-07-15 DIAGNOSIS — L409 Psoriasis, unspecified: Secondary | ICD-10-CM

## 2020-07-16 DIAGNOSIS — M545 Low back pain, unspecified: Secondary | ICD-10-CM

## 2020-07-16 DIAGNOSIS — M159 Polyosteoarthritis, unspecified: Secondary | ICD-10-CM

## 2020-07-16 DIAGNOSIS — G8929 Other chronic pain: Secondary | ICD-10-CM

## 2020-07-17 ENCOUNTER — Telehealth: Payer: Self-pay | Admitting: Adult Health

## 2020-07-17 ENCOUNTER — Telehealth: Payer: Self-pay | Admitting: Internal Medicine

## 2020-07-17 MED ORDER — METHOCARBAMOL 500 MG PO TABS: 500.0000 mg | ORAL_TABLET | Freq: Three times a day (TID) | ORAL | 0 refills | Status: DC | PRN

## 2020-07-17 NOTE — Telephone Encounter (Signed)
Please see phone note from 9/1 as pt called office as well as sending mychart message. Closing this encounter.

## 2020-07-17 NOTE — Telephone Encounter (Signed)
Patient states she is returning a call from yesterday. I did not see a note.

## 2020-07-17 NOTE — Telephone Encounter (Signed)
Called and spoke with pt who stated she just completed sleep study 8/27. Stated to her that it usually takes 1-2 weeks prior to [place] receiving the results and stated to her once they have been reviewed, we will call her and let her know what the results are. Pt verbalized understanding. Nothing further needed.

## 2020-07-17 NOTE — Telephone Encounter (Signed)
Returned the call to the patient. It looks like she is due for a follow up with Dr. Debara Pickett. The patient stated that she is seeing someone else and is not interested in seeing Dr. Debara Pickett. The recall has been removed.

## 2020-07-18 ENCOUNTER — Other Ambulatory Visit: Payer: Self-pay

## 2020-07-18 ENCOUNTER — Ambulatory Visit: Payer: PPO | Attending: Neurosurgery | Admitting: Physical Therapy

## 2020-07-18 VITALS — BP 126/90 | HR 90

## 2020-07-18 DIAGNOSIS — M542 Cervicalgia: Secondary | ICD-10-CM

## 2020-07-18 DIAGNOSIS — M6281 Muscle weakness (generalized): Secondary | ICD-10-CM | POA: Diagnosis not present

## 2020-07-18 DIAGNOSIS — R262 Difficulty in walking, not elsewhere classified: Secondary | ICD-10-CM | POA: Diagnosis not present

## 2020-07-18 DIAGNOSIS — M5442 Lumbago with sciatica, left side: Secondary | ICD-10-CM | POA: Diagnosis not present

## 2020-07-18 DIAGNOSIS — G8929 Other chronic pain: Secondary | ICD-10-CM | POA: Insufficient documentation

## 2020-07-18 DIAGNOSIS — M5441 Lumbago with sciatica, right side: Secondary | ICD-10-CM | POA: Insufficient documentation

## 2020-07-18 DIAGNOSIS — Z9181 History of falling: Secondary | ICD-10-CM
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
## 2020-07-23 ENCOUNTER — Ambulatory Visit: Payer: PPO | Admitting: Physical Therapy

## 2020-07-23 ENCOUNTER — Other Ambulatory Visit: Payer: Self-pay | Admitting: Primary Care

## 2020-07-23 ENCOUNTER — Other Ambulatory Visit: Payer: Self-pay | Admitting: Internal Medicine

## 2020-07-23 DIAGNOSIS — J3089 Other allergic rhinitis: Secondary | ICD-10-CM

## 2020-07-23 DIAGNOSIS — L509 Urticaria, unspecified: Secondary | ICD-10-CM

## 2020-07-23 DIAGNOSIS — E119 Type 2 diabetes mellitus without complications: Secondary | ICD-10-CM

## 2020-07-23 DIAGNOSIS — E7800 Pure hypercholesterolemia, unspecified: Secondary | ICD-10-CM

## 2020-07-24 ENCOUNTER — Ambulatory Visit: Payer: PPO | Admitting: Dermatology

## 2020-07-24 ENCOUNTER — Other Ambulatory Visit: Payer: Self-pay | Admitting: *Deleted

## 2020-07-24 MED ORDER — METOPROLOL SUCCINATE ER 50 MG PO TB24
50.0000 mg | ORAL_TABLET | Freq: Every day | ORAL | 6 refills | Status: DC
Start: 2020-07-24 — End: 2020-08-01

## 2020-07-24 NOTE — Telephone Encounter (Signed)
Tammy please advise on patient mychart message  Hello, Do you have an update for me about my sleep study? I feel like I need to get started asap. Please advise. Thanks, Hoyle Sauer :-)

## 2020-07-24 NOTE — Telephone Encounter (Signed)
Lipid panel ordered along with A1C.

## 2020-07-24 NOTE — Telephone Encounter (Signed)
Can we check to see when /where this was done ,   Was suppose to have a CPAP titration study done .

## 2020-07-25 ENCOUNTER — Other Ambulatory Visit: Payer: Self-pay

## 2020-07-25 ENCOUNTER — Other Ambulatory Visit: Payer: Self-pay | Admitting: Primary Care

## 2020-07-25 ENCOUNTER — Ambulatory Visit: Payer: PPO | Admitting: Physical Therapy

## 2020-07-25 ENCOUNTER — Encounter: Payer: Self-pay | Admitting: Physical Therapy

## 2020-07-25 DIAGNOSIS — Z9181 History of falling: Secondary | ICD-10-CM

## 2020-07-25 DIAGNOSIS — M542 Cervicalgia: Secondary | ICD-10-CM

## 2020-07-25 DIAGNOSIS — M6281 Muscle weakness (generalized): Secondary | ICD-10-CM

## 2020-07-25 DIAGNOSIS — G8929 Other chronic pain: Secondary | ICD-10-CM

## 2020-07-25 DIAGNOSIS — J3089 Other allergic rhinitis: Secondary | ICD-10-CM

## 2020-07-25 DIAGNOSIS — M5442 Lumbago with sciatica, left side: Secondary | ICD-10-CM | POA: Diagnosis not present

## 2020-07-25 DIAGNOSIS — R262 Difficulty in walking, not elsewhere classified: Secondary | ICD-10-CM

## 2020-07-25 NOTE — Telephone Encounter (Signed)
Will need to call [facility] to see if read , can check with [facility] to see if this has been read yet .

## 2020-07-25 NOTE — Telephone Encounter (Signed)
PCC can you please help with this? 

## 2020-07-25 NOTE — Therapy (Signed)
[facility] [address], [place], [address] Phone: [phone]   Fax:  [phone]  Physical Therapy Treatment  Patient Details  Name: Emily Moon MRN: [id number] Date of Birth: Aug 15, 1961 Referring Provider (PT): Meade Maw, MD   Encounter Date: 07/25/2020   PT End of Session - 07/25/20 1433    Visit Number 2    Number of Visits 24    Date for PT Re-Evaluation 10/10/20    Authorization Type [employer]    Progress Note Due on Visit 10    PT Start Time 1420    PT Stop Time 1500    PT Time Calculation (min) 40 min    Equipment Utilized During Treatment Gait belt    Activity Tolerance Patient limited by fatigue    Behavior During Therapy [facility] for tasks assessed/performed;Impulsive           Past Medical History:  Diagnosis Date   Arthritis    osteo   Asthma    Bipolar disorder ([place]) 05/21/14   Cataracts, bilateral    DDD (degenerative disc disease), cervical    also back   Depression    Dizziness    Positional   Edema    feet/legs   Fibromyalgia syndrome    Fungal infection    Finger nails   GERD (gastroesophageal reflux disease)    Gout    Headache    seasonal allergies   Heart palpitations    Hip dysplasia, congenital 09/15/2013   Hypercholesterolemia    Multiple sclerosis (HCC)    weakness   Nephrolithiasis    kidney stones   Osteoporosis    osteoarthritis   Pneumonia    PONV (postoperative nausea and vomiting)    no problem after cataract surgery   Psoriasis    Renal stone    Shortness of breath dyspnea    wheezing   Sleep apnea 2012   sleep study / slight, no interventions   Urinary frequency     Past Surgical History:  Procedure Laterality Date   CATARACT EXTRACTION W/PHACO Left 05/21/2015   Procedure: CATARACT EXTRACTION PHACO AND INTRAOCULAR LENS PLACEMENT ([place]);  Surgeon: Birder Robson, MD;  Location: [facility];   Service: Ophthalmology;  Laterality: Left;  [place] 00:35 AP% 22.9 CDE 8.11 fluid pack lot #[id number]   CATARACT EXTRACTION W/PHACO Right 06/04/2015   Procedure: CATARACT EXTRACTION PHACO AND INTRAOCULAR LENS PLACEMENT (IOC);  Surgeon: Birder Robson, MD;  Location: [facility];  Service: Ophthalmology;  Laterality: Right;  US:00:48 AP%: 10.5 CDE:5.08 Fluid lot #[id number]   CYSTOSCOPY/URETEROSCOPY/HOLMIUM LASER/STENT PLACEMENT Bilateral 09/22/2016   Procedure: CYSTOSCOPY/URETEROSCOPY/HOLMIUM LASER/STENT PLACEMENT;  Surgeon: Hollice Espy, MD;  Location: [facility];  Service: Urology;  Laterality: Bilateral;   EYE SURGERY  2015   tissue biopsy   FOOT SURGERY  2015   JOINT REPLACEMENT Left 2013   hip replacement   LITHOTRIPSY     PTOSIS REPAIR Bilateral 02/18/2016   Procedure: BILATERAL PTOSIS REPAIR UPPER EYELIDS;  Surgeon: Karle Starch, MD;  Location: [facility];  Service: Ophthalmology;  Laterality: Bilateral;  LEAVE PT EARLY AM   thumb surgery Right    TONSILLECTOMY  1973    There were no vitals filed for this visit.   Subjective Assessment - 07/25/20 1426    Subjective Patient reports it is earlier in her day since her sleep cycle is disrupted so she got up recently. States her legs are 4/10 pain currently. She state she was  okay following last session since she did not do much at initial eval. She states she would like soft tissue and hands on work because it makes her feel better but states she will have to work here. She is getting medical massage once a week that she really likes.    Pertinent History Patient is a 59 y.o. female who presents to outpatient physical therapy with a referral for medical diagnosis cervicalgia, chronic bilateral low back pain without sciatica. This patient's chief complaints consist of neck and low back pain radiating to the thighs, leading to the following functional deficits: difficulty with ADLs,  IADLs, hygene, bending, twisting, wiping,  showering, lifting, carrying things, hard to back out and veiw surroundings. Relevant past medical history and comorbidities include progressive/relapsing Multiple Sclerosis, widepread OA, obstructive pulmonary disease, diabetes, psoriasis, oral herpes, fibromyalgia, sleep apnea, hip dysplasia, hx hip bursitis, shoulder bursitis, asthma, bipolar I disorder, obesity, urinary frequency, hyperlipidemia, chronic pain syndrome,  L THA, former smoker, ankle/foot problem, falls with injuries, chronic tachycardia controlled with beta blocker.   Patient denies hx of cancer, stroke, seizures, major cardiac events except chronic tachycardia (now on beta blocker), unexplained weight loss.    Limitations Standing;Lifting;Walking;House hold activities   ADLs,  IADLs, hygene, bending, twisting, wiping, showering, lifting, carrying things, hard to back out and veiw surroundings.   How long can you sit comfortably? pain getting up after prolonged sitting    How long can you stand comfortably? 1 minute    How long can you walk comfortably? 100 feet?    Diagnostic tests Lumbar MRI report 03/29/2020: "IMPRESSION:L2-3: Disc bulge. Bilateral facet degeneration and hypertrophy. Nocompressive stenosis. L3-4: Disc bulge more prominent towards the left. Facet and ligamentous hypertrophy. Left lateral recess stenosis that couldpossibly be symptomatic. L4-5: Chronic facet arthropathy with 6 mm of anterolisthesis. Bulging of the disc. Stenosis of the lateral recesses and foramina but without distinct focal neural compression. L5-S1: Chronic disc degeneration and facet osteoarthritis. No stenosis. Findings could certainly relate to back pain. As far as focal neural compression, the most likely location would seem to be the left lateral recess at L3-4." Cervical/Head MRI report: "IMPRESSION: MRI HEAD IMPRESSION: 1. Scattered multifocal foci of T2/FLAIR hyperintensities involving the left greater than right cerebral hemispheres, compatible  with history of multiple sclerosis. No evidence for active demyelination. 2. Otherwise normal brain MRI for age. MRI CERVICAL SPINE IMPRESSION: 1. Normal MRI appearance of the cervical spinal cord. No evidence for demyelinating disease. 2. Degenerative spondylosis at C4-5 and C5-6 with resultant moderate spinal stenosis, with severe bilateral C5 and C6 foraminal narrowing. 3. Left foraminal disc osteophyte complex at C6-7 with resultant severe left C7 foraminal stenosis. 4. Right eccentric disc bulge with uncovertebral spurring at C3-4 with resultant moderate right C4 foraminal narrowing."  recent radiographs of left clavicle, R ankle, B wrists, and left foot negative for acute problems.    Patient Stated Goals doesn't want to have back surgery    Currently in Pain? Yes    Pain Score 4     Pain Location Leg    Pain Orientation Right;Left    Pain Onset More than a month ago          OBJECTIVE  FOTO = 48 (07/25/2020); Marland Kitchen  TREATMENT:   Therapeutic exercise: to centralize symptoms and improve ROM, strength, muscular endurance, and activity tolerance required for successful completion of functional activities.  - NuStep level 0 using bilateral upper and lower extremities (only light use on L UE due  to pain at clavicle). Seat setting 11, UE setting 10. For improved extremity mobility, muscular endurance, and activity tolerance; and to induce the analgesic effect of aerobic exercise, stimulate improved joint nutrition, and prepare body structures and systems for following interventions. x 10  minutes. Average SPM = 58. 121 bpm, 95%. RPE 1/10. - Leg press machine, 3x20, seat set to one hole on seat side. Focusing on gentle lowering on weight and extending knees without locking. 35# B LE.  - sit <> stand 2x10 from chair with airex on it. Stopped at 2 sets due to report of hip pain.  - sloutch-overcorrect x 20 (Feels in glutes and hamstrings).  - standing hip abduction 2x10 each side.    HOME EXERCISE  PROGRAM Access Code: [email or web address] URL: [email or web address] Date: 07/25/2020 Prepared by: Rosita Kea  Exercises Sit to Stand without Arm Support - 1 x daily - 2 sets - 10 reps Slouch Overcorrect in Chair - 2 x daily - 20 reps Standing Hip Abduction with Counter Support - 1 x daily - 2 sets - 10 reps    PT Education - 07/25/20 1428    Education Details Exercise purpose/form. Education on HEP including handout    Person(s) Educated Patient    Methods Explanation;Demonstration;Tactile cues;Verbal cues    Comprehension Verbalized understanding;Returned demonstration;Verbal cues required;Tactile cues required;Need further instruction            PT Short Term Goals - 07/18/20 1836      PT SHORT TERM GOAL #1   Title Be independent with initial home exercise program for self-management of symptoms.    Baseline to be provided at visit 2 (07/18/2020);    Time 3    Period Weeks    Status New    Target Date 08/08/20             PT Long Term Goals - 07/18/20 1837      PT LONG TERM GOAL #1   Title Be independent with a long-term home exercise program for self-management of symptoms.    Baseline initial HEP to be provided at visit 2 (07/18/2020);    Time 12    Period Weeks    Status New   TARGET DATE FOR ALL LONG TERM GOALS: 10/10/2020     PT LONG TERM GOAL #2   Title Demonstrate improved FOTO score by 10 units to demonstrate improvement in overall condition and self-reported functional ability.    Baseline to be measured visit 2 (07/18/2020);    Time 12    Period Weeks    Status New      PT LONG TERM GOAL #3   Title Patient will demonstrate improvement in 5 Times Sit to Stand Test to equal or less than 15 seconds from 18.5 inch surface with no UE support to demonstrate improved functional strength.    Baseline 26 seconds with hands on knees, (knee pain). Lacks full knee and hip extension (07/18/2020);    Time 12    Period Weeks    Status New      PT LONG TERM GOAL #4    Title Patient will demonstrate cervical spine rotation of equal or greater than 60 degrees each direction to improve ability to check blind spot while driving.    Baseline Rotation: R= 40, L = 32 (07/18/2020);    Time 12    Period Weeks    Status New      PT LONG TERM GOAL #5   Title Complete community, work and/or recreational  activities with equal or greater than 50% less limitation due to current condition.    Baseline Difficulty with ADLs,  IADLs, hygene, bending, twisting, wiping, showering, lifting, carrying things, hard to back out and veiw surroundings (07/18/2020);    Time 12    Period Weeks    Status New      Additional Long Term Goals   Additional Long Term Goals Yes      PT LONG TERM GOAL #6   Title Patient will ambulate equal or more than 600 feet on the 6 Minute Walk Test to improve community amublation.    Baseline 300 feet (07/18/2020);    Time 12    Period Weeks    Status New                 Plan - 07/25/20 1502    Clinical Impression Statement Patient tolerated treatment fair with pain at R hip starting with sit <> stand and affecting hip abduction. Significant HR increase with sit <> stand to 131 bpm. Assigned HEP and will assess response next session. Patient would benefit from continued management of limiting condition by skilled physical therapist to address remaining impairments and functional limitations to work towards stated goals and return to PLOF or maximal functional independence.    Personal Factors and Comorbidities Age;Behavior Pattern;Comorbidity 3+;Time since onset of injury/illness/exacerbation;Past/Current Experience;Fitness;Other   financial limitations   Comorbidities Relevant past medical history and comorbidities include progressive/relapsing Multiple Sclerosis, widepread OA, obstructive pulmonary disease, diabetes, psoriasis, oral herpes, fibromyalgia, sleep apnea, hip dysplasia, hx hip bursitis, shoulder bursitis, asthma, bipolar I disorder,  obesity, urinary frequency, hyperlipidemia, chronic pain syndrome,  L THA, former smoker, ankle/foot problem, falls with injuries, chronic tachycardia controlled with beta blocker.    Examination-Activity Limitations Bathing;Stand;Lift;Locomotion Level;Toileting;Bend;Transfers;Carry;Sit;Dressing;Squat;Hygiene/Grooming;Stairs    Examination-Participation Restrictions Driving;Laundry;Cleaning    Stability/Clinical Decision Making Evolving/Moderate complexity    Rehab Potential Fair    PT Frequency 2x / week    PT Duration 12 weeks    PT Treatment/Interventions ADLs/Self Care Home Management;Cryotherapy;Moist Heat;Gait training;Stair training;Functional mobility training;Therapeutic activities;DME Instruction;Balance training;Neuromuscular re-education;Cognitive remediation;Patient/family education;Therapeutic exercise;Manual techniques;Passive range of motion;Dry needling;Energy conservation;Joint Manipulations;Spinal Manipulations    PT Next Visit Plan graded exercise    PT Home Exercise Plan [facility] Access Code: [email or web address]    Consulted and Agree with Plan of Care Patient           Patient will benefit from skilled therapeutic intervention in order to improve the following deficits and impairments:  Abnormal gait, Decreased cognition, Dizziness, Impaired sensation, Improper body mechanics, Pain, Cardiopulmonary status limiting activity, Decreased coordination, Decreased mobility, Hypermobility, Impaired tone, Decreased activity tolerance, Decreased endurance, Decreased range of motion, Decreased strength, Hypomobility, Impaired perceived functional ability, Decreased balance, Difficulty walking, Impaired flexibility, Obesity  Visit Diagnosis: Chronic bilateral low back pain with bilateral sciatica  Cervicalgia  Muscle weakness (generalized)  Difficulty in walking, not elsewhere classified  History of falling     Problem List Patient Active Problem List   Diagnosis Date Noted    Marijuana use 11/28/2019   Psoriasis 11/02/2019   Rash and nonspecific skin eruption 02/20/2019   Asthma 02/10/2019   Epigastric pain 12/21/2018   OSA (obstructive sleep apnea) 12/02/2018   Joint swelling 10/24/2018   Environmental and seasonal allergies 10/24/2018   Greater trochanteric bursitis of right hip 09/28/2018   Prediabetes 07/22/2018   Bipolar I disorder, most recent episode (or current) manic ([place]) 05/20/2018   Vitamin D deficiency 02/15/2018   Preventative health care 11/24/2017  Urinary frequency 03/30/2017   B12 deficiency 05/22/2016   Dizziness 03/16/2016   Medicare annual wellness visit, subsequent 10/25/2015   Chronic back pain 10/31/2014   Weight gain 06/21/2014   Bipolar disorder ([place]) 05/21/2014   Deformity of right foot 12/27/2013   Hip dysplasia, congenital 09/15/2013   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 Graylon Good, PT, DPT 07/25/20, 3:04 PM  [facility] [address], [place], [address] Phone: [phone]   Fax:  [phone]  Name: Emily Moon MRN: [id number] Date of Birth: April 25, 1961

## 2020-07-25 NOTE — Telephone Encounter (Signed)
Per a previous phone message, pt had the study done on 8/27 at [facility]. I do not see these in the chart, please advise what needs to be done to get these results.

## 2020-07-26 ENCOUNTER — Other Ambulatory Visit: Payer: Self-pay | Admitting: Primary Care

## 2020-07-26 DIAGNOSIS — K219 Gastro-esophageal reflux disease without esophagitis: Secondary | ICD-10-CM

## 2020-07-29 DIAGNOSIS — M3501 Sicca syndrome with keratoconjunctivitis: Secondary | ICD-10-CM | POA: Diagnosis not present

## 2020-07-29 LAB — HM DIABETES EYE EXAM

## 2020-07-30 ENCOUNTER — Ambulatory Visit (INDEPENDENT_AMBULATORY_CARE_PROVIDER_SITE_OTHER): Payer: PPO | Admitting: *Deleted

## 2020-07-30 ENCOUNTER — Ambulatory Visit: Payer: PPO | Admitting: Physical Therapy

## 2020-07-30 DIAGNOSIS — E538 Deficiency of other specified B group vitamins: Secondary | ICD-10-CM | POA: Diagnosis not present

## 2020-07-30 MED ORDER — CYANOCOBALAMIN 1000 MCG/ML IJ SOLN
1000.0000 ug | Freq: Once | INTRAMUSCULAR | Status: AC
  Administered 2020-07-30: 1000 ug via INTRAMUSCULAR

## 2020-07-30 NOTE — Telephone Encounter (Signed)
Called patient placed on schedule for tomorrow at 12.

## 2020-07-30 NOTE — Telephone Encounter (Signed)
For some reason I do not have any documentation regarding her herpes. Is it genital or oral? I'd like to see her and get some labs. Will you add her on for a virtual visit for tomorrow at 12pm?

## 2020-07-31 ENCOUNTER — Encounter: Payer: Self-pay | Admitting: Family Medicine

## 2020-07-31 ENCOUNTER — Encounter: Payer: Self-pay | Admitting: Primary Care

## 2020-07-31 ENCOUNTER — Telehealth (INDEPENDENT_AMBULATORY_CARE_PROVIDER_SITE_OTHER): Payer: PPO | Admitting: Primary Care

## 2020-07-31 ENCOUNTER — Telehealth (HOSPITAL_BASED_OUTPATIENT_CLINIC_OR_DEPARTMENT_OTHER): Payer: PPO | Admitting: Pulmonary Disease

## 2020-07-31 ENCOUNTER — Other Ambulatory Visit: Payer: Self-pay

## 2020-07-31 VITALS — Ht 69.0 in | Wt 241.0 lb

## 2020-07-31 DIAGNOSIS — K1379 Other lesions of oral mucosa: Secondary | ICD-10-CM

## 2020-07-31 DIAGNOSIS — B009 Herpesviral infection, unspecified: Secondary | ICD-10-CM | POA: Insufficient documentation

## 2020-07-31 DIAGNOSIS — G4733 Obstructive sleep apnea (adult) (pediatric): Secondary | ICD-10-CM

## 2020-07-31 MED ORDER — VALACYCLOVIR HCL 1 G PO TABS: 1000.0000 mg | ORAL_TABLET | Freq: Two times a day (BID) | ORAL | 0 refills | Status: DC

## 2020-07-31 NOTE — Assessment & Plan Note (Signed)
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  No oral lesions.  Unclear if this is actually HSV 1.  Given that we have no diagnosis or labs to confirm HSV-1, we will obtain HSV labs soon.  I will go ahead and treat her "HSV" as she insists that Valtrex resolves symptoms.  She will update.

## 2020-07-31 NOTE — Telephone Encounter (Signed)
CPAP ttiration showed CPAP 14 cm with med full fac emask Please send Rx accordingly Sorry for the delayed report

## 2020-07-31 NOTE — Patient Instructions (Signed)
Complete lab work tomorrow as scheduled.   Start valacyclovir 1000 mg tablets. Take 1 tablet by mouth twice daily for five days.  It was a pleasure to see you today! Allie Bossier, NP-C

## 2020-07-31 NOTE — Progress Notes (Signed)
Subjective:    Patient ID: Emily Moon, female    DOB: 1961-01-01, 59 y.o.   MRN: [id number]  HPI  Virtual Visit via Video Note  I connected with Emily Moon on 07/31/20 at 12:00 PM EDT by a video enabled telemedicine application and verified that I am speaking with the correct person using two identifiers.  Location: Patient: Home Provider: Office   I discussed the limitations of evaluation and management by telemedicine and the availability of in person appointments. The patient expressed understanding and agreed to proceed.  History of Present Illness:  Emily Moon is a 59 year old female with an extensive medical history including hyperlipidemia, osteoarthritis, multiple sclerosis, chronic pain syndrome, bipolar disorder, recurrent rash, psoriasis, type 2 diabetes who presents today with a chief complaint of "HSV" flare.  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 She's currently managed on Lysine 1000 mg tablets for which she's been taking daily for years, for prevention of "HSV" flares. She experiences flares infrequently due to daily use of Lysine, but has had more flares this year due to stress.   Her recent "flare" began late last week. She never experiences sores to her lips or exterior mouth, only with lesions to the roof of her mouth and tongue. When she does experience flares she's treated with Valtrex with resolve. Her dentist gave her a valacyclovir 30 day supply last year.   Observations/Objective:  Alert and oriented. Appears well, not sickly. No distress. Speaking in complete sentences. Canker appearing sores to the tongue, no oral sores noted.  Assessment and Plan:  See problem based charting.  Follow Up Instructions:  Complete lab work tomorrow as scheduled.   Start valacyclovir 1000 mg tablets. Take 1 tablet by mouth twice daily for five days.  It was a  pleasure to see you today! Allie Bossier, NP-C    I discussed the assessment and treatment plan with the patient. The patient was provided an opportunity to ask questions and all were answered. The patient agreed with the plan and demonstrated an understanding of the instructions.   The patient was advised to call back or seek an in-person evaluation if the symptoms worsen or if the condition fails to improve as anticipated.   Emily Koch, NP    Review of Systems  Constitutional: Negative for fever.  HENT:       Tongue sores.  Neurological: Negative for numbness.       Past Medical History:  Diagnosis Date  . Arthritis    osteo  . Asthma   . Bipolar disorder ([place]) 05/21/14  . Cataracts, bilateral   . DDD (degenerative disc disease), cervical    also back  . Depression   . Dizziness    Positional  . Edema    feet/legs  . Fibromyalgia syndrome   . Fungal infection    Finger nails  . GERD (gastroesophageal reflux disease)   . Gout   . Headache    seasonal allergies  . Heart palpitations   . Hip dysplasia, congenital 09/15/2013  . Hypercholesterolemia   . Multiple sclerosis (HCC)    weakness  . Nephrolithiasis    kidney stones  . Osteoporosis    osteoarthritis  . Pneumonia   . PONV (postoperative nausea and vomiting)    no problem after cataract surgery  . Psoriasis   . Renal stone   . Shortness of  breath dyspnea    wheezing  . Sleep apnea 2012   sleep study / slight, no interventions  . Urinary frequency      Social History   Socioeconomic History  . Marital status: Divorced    Spouse name: Not on file  . Number of children: 1  . Years of education: Not on file  . Highest education level: Not on file  Occupational History  . 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 Smoking status: Former Smoker    Years: 25.00    Types: Cigarettes    Quit date: 11/16/2012    Years since quitting: 7.7  . Smokeless  tobacco: Never Used  . Tobacco comment: occasional use  Vaping Use  . Vaping Use: Former  Substance and Sexual Activity  . Alcohol use: No    Alcohol/week: 0.0 standard drinks  . Drug use: No    Types: Methylphenidate  . Sexual activity: Never  Other Topics Concern  . Not on file  Social History Narrative  . Not on file   Social Determinants of Health   Financial Resource Strain: Low Risk   . Difficulty of Paying Living Expenses: Not hard at all  Food Insecurity: No Food Insecurity  . Worried About Charity fundraiser in the Last Year: Never true  . Ran Out of Food in the Last Year: Never true  Transportation Needs: No Transportation Needs  . Lack of Transportation (Medical): No  . Lack of Transportation (Non-Medical): No  Physical Activity: Inactive  . Days of Exercise per Week: 0 days  . Minutes of Exercise per Session: 0 min  Stress: Stress Concern Present  . Feeling of Stress : Very much  Social Connections:   . Frequency of Communication with Friends and Family: Not on file  . Frequency of Social Gatherings with Friends and Family: Not on file  . Attends Religious Services: Not on file  . Active Member of Clubs or Organizations: Not on file  . Attends Archivist Meetings: Not on file  . Marital Status: Not on file  Intimate Partner Violence: Not At Risk  . Fear of Current or Ex-Partner: No  . Emotionally Abused: No  . Physically Abused: No  . Sexually Abused: No    Past Surgical History:  Procedure Laterality Date  . CATARACT EXTRACTION W/PHACO Left 05/21/2015   Procedure: CATARACT EXTRACTION PHACO AND INTRAOCULAR LENS PLACEMENT (IOC);  Surgeon: Birder Robson, MD;  Location: [facility] ORS;  Service: Ophthalmology;  Laterality: Left;  [place] 00:35 AP% 22.9 CDE 8.11 fluid pack lot #[id number]  . CATARACT EXTRACTION W/PHACO Right 06/04/2015   Procedure: CATARACT EXTRACTION PHACO AND INTRAOCULAR LENS PLACEMENT (IOC);  Surgeon: Birder Robson, MD;  Location: [facility]  ORS;  Service: Ophthalmology;  Laterality: Right;  US:00:48 AP%: 10.5 CDE:5.08 Fluid lot #[id number]  . CYSTOSCOPY/URETEROSCOPY/HOLMIUM LASER/STENT PLACEMENT Bilateral 09/22/2016   Procedure: CYSTOSCOPY/URETEROSCOPY/HOLMIUM LASER/STENT PLACEMENT;  Surgeon: Hollice Espy, MD;  Location: [facility] ORS;  Service: Urology;  Laterality: Bilateral;  . EYE SURGERY  2015   tissue biopsy  . FOOT SURGERY  2015  . JOINT REPLACEMENT Left 2013   hip replacement  . LITHOTRIPSY    . PTOSIS REPAIR Bilateral 02/18/2016   Procedure: BILATERAL PTOSIS REPAIR UPPER EYELIDS;  Surgeon: Karle Starch, MD;  Location: [place] [place];  Service: Ophthalmology;  Laterality: Bilateral;  LEAVE PT EARLY AM  . thumb surgery Right   . TONSILLECTOMY  1973  Family History  Problem Relation Age of Onset  . Cancer Father        Abdomen with mastasis  . Cancer Mother   . Heart disease Mother   . Kidney disease Neg Hx   . Bladder Cancer Neg Hx   . Prostate cancer Neg Hx   . Kidney cancer Neg Hx     Allergies  Allergen Reactions  . Albuterol Shortness Of Breath and Other (See Comments)    Makes pt feel jittery/ tacycardic  . Crestor [Rosuvastatin] Other (See Comments)    Joint pain, muscle pain, and hair loss  . Halcion [Triazolam] Other (See Comments)    Dizziness,headaches,bladder problems  . Levaquin [Levofloxacin In D5w] Diarrhea and Itching    Shoulder pain  . Naproxen Sodium Swelling    Patient tolerates in small doses  . Tylenol [Acetaminophen] Swelling    Patient tolerates in small doses  . Cefaclor Other (See Comments)    Doesn't remember---unsure if actually allergic   . Diclofenac Sodium Other (See Comments)    "made very sick"  . Sulfa Antibiotics Itching    Unsure of reaction possibly itching Unsure of reaction possibly itching  . Tramadol Itching and Nausea And Vomiting  . Aripiprazole Other (See Comments)    Muscle tension/cramping  . Diclofenac Sodium Rash    "made very sick"  . Ibuprofen  Swelling    Patient tolerates in small doses    Current Outpatient Medications on File Prior to Visit  Medication Sig Dispense Refill  . ASPERCREME LIDOCAINE EX Apply 1 application topically 4 (four) times daily as needed (for pain.).     Marland Kitchen aspirin 325 MG tablet Take 325 mg by mouth every 4 (four) hours as needed for headache.     Marland Kitchen azelastine (ASTELIN) 0.1 % nasal spray PLACE 2 SPRAYS INTO BOTH NOSTRILS 2 (TWO) TIMES DAILY 30 mL 1  . BIOTIN PO Take 200 mg by mouth daily.     . budesonide-formoterol (SYMBICORT) 80-4.5 MCG/ACT inhaler Inhale 2 puffs into the lungs 2 (two) times daily. 1 each 6  . calcipotriene-betamethasone (TACLONEX) ointment Apply topically daily. 60 g 0  . cetirizine (ZYRTEC) 10 MG tablet Take 10 mg by mouth 2 (two) times daily.    . Cholecalciferol (VITAMIN D3) 10000 units capsule Take 30,000 Units by mouth daily.     . ciclopirox (LOPROX) 0.77 % cream Apply topically 2 (two) times daily. 30 g 0  . clobetasol (TEMOVATE) 0.05 % external solution Mix clobetasol solution with  CeraVe cream Use twice daily to affected areas.Avoid Face, groin and underarm 50 mL 0  . cyanocobalamin (,VITAMIN B-12,) 1000 MCG/ML injection Inject 1 mL (1,000 mcg total) into the muscle every 3 (three) months. 1 mL 0  . diclofenac sodium (VOLTAREN) 1 % GEL Apply 4 g topically 4 (four) times daily. 500 g 5  . docusate sodium (COLACE) 100 MG capsule Take 1 capsule (100 mg total) by mouth 2 (two) times daily. 60 capsule 0  . DULoxetine (CYMBALTA) 20 MG capsule Take 2 capsules (40 mg total) by mouth daily. 60 capsule 5  . Evolocumab (REPATHA SURECLICK) 332 MG/ML SOAJ Inject 1 Dose into the skin every 14 (fourteen) days. 2 pen 11  . famotidine (PEPCID) 20 MG tablet TAKE 1 TABLET BY MOUTH EVERYDAY AT BEDTIME 30 tablet 0  . fluocinonide (LIDEX) 0.05 % external solution Apply 1 application topically 2 (two) times daily as needed. Apply to scalp twice daily as need for itch 180 mL 3  .  hydrocortisone 2.5 %  cream Apply BID to affected area in underarms and groin prn flares    . hydroquinone 4 % cream 1 APPLICATION TOPICALLY DAILY AS NEEDED FOR BLEMISHES.  3  . hydrOXYzine (VISTARIL) 50 MG capsule TAKE 1 CAPSLE BY MOUTH AT NOON, 1 AT 6 PM, AND 1 AS NEEDED 270 capsule 2  . lamoTRIgine (LAMICTAL) 150 MG tablet Take 2 tablets (300 mg total) by mouth at bedtime. 180 tablet 1  . levalbuterol (XOPENEX HFA) 45 MCG/ACT inhaler Inhale 1-2 puffs into the lungs every 6 (six) hours as needed for wheezing. 15 g 0  . levalbuterol (XOPENEX) 1.25 MG/3ML nebulizer solution USE 1 VIAL VIA NEBULIZER EVERY 4 HOURS AS NEEDED FOR WHEEZING OR SHORTNESS OF BREATH 270 mL 1  . Lysine 1000 MG TABS Take 2,000-4,000 mg by mouth at bedtime. 2000 mg scheduled at bedtime and patient will take 4000 mg if she has outbreak    . Magnesium 500 MG TABS Take 1 tablet by mouth at bedtime.    . metFORMIN (GLUCOPHAGE-XR) 500 MG 24 hr tablet Take 1 tablet (500 mg total) by mouth daily with breakfast. For diabetes. 90 tablet 1  . methocarbamol (ROBAXIN) 500 MG tablet Take 1 tablet (500 mg total) by mouth every 8 (eight) hours as needed for muscle spasms. 90 tablet 0  . metoprolol succinate (TOPROL-XL) 50 MG 24 hr tablet Take 1 tablet (50 mg total) by mouth daily. Take with or immediately following a meal. 30 tablet 6  . neomycin-bacitracin-polymyxin (NEOSPORIN) [phone] ointment Apply 1 application topically 4 (four) times daily as needed (for cut/scrapes.).    Marland Kitchen pantoprazole (PROTONIX) 40 MG tablet TAKE 1 TABLET BY MOUTH EVERY DAY 90 tablet 1  . phenazopyridine (AZO-TABS) 95 MG tablet Take 95 mg by mouth 3 (three) times daily as needed for pain.    . polyethylene glycol powder (GLYCOLAX/MIRALAX) powder MIX 17 GRAMS (1 CAPFUL) WITH 4-8 OZ OF LIQUID AND TAKE BY MOUTH TWICE DAILY AS NEEDED 527 g 0  . temazepam (RESTORIL) 30 MG capsule TAKE ONE TO TWO CAPSULES BY MOUTH NIGHTLY AT BEDTIME 60 capsule 4  . traZODone (DESYREL) 50 MG tablet Take 2  tablets (100 mg total) by mouth at bedtime. 60 tablet 7  . Olopatadine HCl 0.2 % SOLN 1 drop once daily     No current facility-administered medications on file prior to visit.    Ht 5\' 9"  (1.753 m)   Wt 241 lb (109.3 kg)   LMP 08/23/2014   BMI 35.59 kg/m    Objective:   Physical Exam HENT:     Head:     Comments: Canker appearing sores to right lateral tongue Pulmonary:     Effort: Pulmonary effort is normal.  Neurological:     Mental Status: She is alert and oriented to person, place, and time.            Assessment & Plan:

## 2020-07-31 NOTE — Telephone Encounter (Signed)
Sorry for the delay , tried to call with straight to voicemail  CPAP ttiration showed CPAP 14 cm with med full fac emask (no need for oxygen or BIPAP )   Please place order for CPAP with above settings and enroll in [place].  Ov in 2 months .

## 2020-07-31 NOTE — Telephone Encounter (Signed)
Called and spoke with patient to let her know that CPAP titration showed that she does need CPAP but does not need oxygen or BIPAP. She said that she is ok with [place] placing order for her to get set up with CPAP. Order has been placed. She has a mask from before and states this is the only mask that works for her otherwise the CPAP will not be successful. Mask is a Resmed Airfit F30 size medium. Nothing further needed at this time.

## 2020-08-01 ENCOUNTER — Ambulatory Visit: Payer: PPO | Admitting: Physical Therapy

## 2020-08-01 ENCOUNTER — Other Ambulatory Visit: Payer: Self-pay

## 2020-08-01 ENCOUNTER — Other Ambulatory Visit: Payer: Self-pay | Admitting: Cardiology

## 2020-08-01 ENCOUNTER — Other Ambulatory Visit (INDEPENDENT_AMBULATORY_CARE_PROVIDER_SITE_OTHER): Payer: PPO

## 2020-08-01 DIAGNOSIS — E7800 Pure hypercholesterolemia, unspecified: Secondary | ICD-10-CM

## 2020-08-01 DIAGNOSIS — E119 Type 2 diabetes mellitus without complications: Secondary | ICD-10-CM | POA: Diagnosis not present

## 2020-08-01 DIAGNOSIS — K1379 Other lesions of oral mucosa: Secondary | ICD-10-CM | POA: Diagnosis not present

## 2020-08-01 LAB — LIPID PANEL
Cholesterol: 131 mg/dL (ref 0–200)
HDL: 46.8 mg/dL (ref >39.00)
NonHDL: 83.75
Total CHOL/HDL Ratio: 3
Triglycerides: 352 mg/dL — ABNORMAL HIGH (ref 0.0–149.0)
VLDL: 70.4 mg/dL — ABNORMAL HIGH (ref 0.0–40.0)

## 2020-08-01 LAB — LDL CHOLESTEROL, DIRECT: Direct LDL: 53 mg/dL

## 2020-08-01 LAB — HEMOGLOBIN A1C: Hgb A1c MFr Bld: 6.7 % — ABNORMAL HIGH (ref 4.6–6.5)

## 2020-08-01 MED ORDER — METOPROLOL SUCCINATE ER 50 MG PO TB24: 50.0000 mg | ORAL_TABLET | Freq: Every day | ORAL | 1 refills | Status: DC

## 2020-08-01 NOTE — Telephone Encounter (Signed)
°*  STAT* If patient is at the pharmacy, call can be transferred to refill team.   1. Which medications need to be refilled? (please list name of each medication and dose if known) metoprolol succinate (TOPROL-XL) 50 MG 24 hr tablet  2. Which pharmacy/location (including street and city if local pharmacy) is medication to be sent to? [facility] [place], [place]  3. Do they need a 30 day or 90 day supply? 90   Dosage was increased from 0.5 table to a whole tablet. She needs a new request sent in because she is taking her last tablet today.

## 2020-08-01 NOTE — Telephone Encounter (Signed)
Requested Prescriptions   Signed Prescriptions Disp Refills  . metoprolol succinate (TOPROL-XL) 50 MG 24 hr tablet 90 tablet 1    Sig: Take 1 tablet (50 mg total) by mouth daily. Take with or immediately following a meal.    Authorizing Provider: [name] Sable    Ordering User: Britt Bottom

## 2020-08-06 ENCOUNTER — Encounter: Payer: PPO | Admitting: Physical Therapy

## 2020-08-06 LAB — HSV 1/2 AB (IGM), IFA W/RFLX TITER
HSV 1 IgM Screen: NEGATIVE
HSV 2 IgM Screen: NEGATIVE

## 2020-08-06 LAB — HSV(HERPES SIMPLEX VRS) I + II AB-IGG
HAV 1 IGG,TYPE SPECIFIC AB: 9.14 index — ABNORMAL HIGH
HSV 2 IGG,TYPE SPECIFIC AB: <0.9 index

## 2020-08-06 NOTE — Progress Notes (Signed)
Approve B12 injection.

## 2020-08-08 ENCOUNTER — Encounter: Payer: Self-pay | Admitting: Physical Therapy

## 2020-08-08 ENCOUNTER — Ambulatory Visit: Payer: PPO | Admitting: Physical Therapy

## 2020-08-08 ENCOUNTER — Other Ambulatory Visit: Payer: Self-pay

## 2020-08-08 DIAGNOSIS — M5442 Lumbago with sciatica, left side: Secondary | ICD-10-CM

## 2020-08-08 DIAGNOSIS — M542 Cervicalgia: Secondary | ICD-10-CM

## 2020-08-08 DIAGNOSIS — Z9181 History of falling: Secondary | ICD-10-CM

## 2020-08-08 DIAGNOSIS — R262 Difficulty in walking, not elsewhere classified: Secondary | ICD-10-CM

## 2020-08-08 DIAGNOSIS — G8929 Other chronic pain: Secondary | ICD-10-CM

## 2020-08-08 DIAGNOSIS — M6281 Muscle weakness (generalized): Secondary | ICD-10-CM

## 2020-08-08 NOTE — Therapy (Signed)
[facility] [address] [place], [address] Phone: [phone]   Fax:  [phone]  Physical Therapy Treatment  Patient Details  Name: SOLANGEL MCMANAWAY MRN: [id number] Date of Birth: 1961/03/04 Referring Provider (PT): Meade Maw, MD   Encounter Date: 08/08/2020   PT End of Session - 08/08/20 1448    Visit Number 3    Number of Visits 24    Date for PT Re-Evaluation 10/10/20    Authorization Type [employer]    Progress Note Due on Visit 10    PT Start Time 1400    PT Stop Time 1428    PT Time Calculation (min) 28 min    Equipment Utilized During Treatment Gait belt    Activity Tolerance Patient limited by pain    Behavior During Therapy [facility] for tasks assessed/performed;Impulsive           Past Medical History:  Diagnosis Date  . Arthritis    osteo  . Asthma   . Bipolar disorder ([place]) 05/21/14  . Cataracts, bilateral   . DDD (degenerative disc disease), cervical    also back  . Depression   . Dizziness    Positional  . Edema    feet/legs  . Fibromyalgia syndrome   . Fungal infection    Finger nails  . GERD (gastroesophageal reflux disease)   . Gout   . Headache    seasonal allergies  . Heart palpitations   . Hip dysplasia, congenital 09/15/2013  . Hypercholesterolemia   . Multiple sclerosis (HCC)    weakness  . Nephrolithiasis    kidney stones  . Osteoporosis    osteoarthritis  . Pneumonia   . PONV (postoperative nausea and vomiting)    no problem after cataract surgery  . Psoriasis   . Renal stone   . Shortness of breath dyspnea    wheezing  . Sleep apnea 2012   sleep study / slight, no interventions  . Urinary frequency     Past Surgical History:  Procedure Laterality Date  . CATARACT EXTRACTION W/PHACO Left 05/21/2015   Procedure: CATARACT EXTRACTION PHACO AND INTRAOCULAR LENS PLACEMENT (IOC);  Surgeon: Birder Robson, MD;  Location: [facility];   Service: Ophthalmology;  Laterality: Left;  [place] 00:35 AP% 22.9 CDE 8.11 fluid pack lot #[id number]  . CATARACT EXTRACTION W/PHACO Right 06/04/2015   Procedure: CATARACT EXTRACTION PHACO AND INTRAOCULAR LENS PLACEMENT (IOC);  Surgeon: Birder Robson, MD;  Location: [facility];  Service: Ophthalmology;  Laterality: Right;  US:00:48 AP%: 10.5 CDE:5.08 Fluid lot #[id number]  . CYSTOSCOPY/URETEROSCOPY/HOLMIUM LASER/STENT PLACEMENT Bilateral 09/22/2016   Procedure: CYSTOSCOPY/URETEROSCOPY/HOLMIUM LASER/STENT PLACEMENT;  Surgeon: Hollice Espy, MD;  Location: [facility];  Service: Urology;  Laterality: Bilateral;  . EYE SURGERY  2015   tissue biopsy  . FOOT SURGERY  2015  . JOINT REPLACEMENT Left 2013   hip replacement  . LITHOTRIPSY    . PTOSIS REPAIR Bilateral 02/18/2016   Procedure: BILATERAL PTOSIS REPAIR UPPER EYELIDS;  Surgeon: Karle Starch, MD;  Location: [facility];  Service: Ophthalmology;  Laterality: Bilateral;  LEAVE PT EARLY AM  . thumb surgery Right   . TONSILLECTOMY  1973    There were no vitals filed for this visit.   Subjective Assessment - 08/08/20 1400    Subjective Patient reports she has 3/10 pain in both calves, hips, and thighs and R shoulder and neck. State she had increased pain in her left hip for 1.5 days following  last treatment session. She is unclear about which exercise she thinks may have contributed to this at last session. She is 15 min late after getting lost on her way here. She states Metformin affects her cognition. She has been doing her HEP without a problem.    Pertinent History Patient is a 59 y.o. female who presents to outpatient physical therapy with a referral for medical diagnosis cervicalgia, chronic bilateral low back pain without sciatica. This patient's chief complaints consist of neck and low back pain radiating to the thighs, leading to the following functional deficits: difficulty with ADLs,  IADLs, hygene, bending, twisting, wiping, showering,  lifting, carrying things, hard to back out and veiw surroundings. Relevant past medical history and comorbidities include progressive/relapsing Multiple Sclerosis, widepread OA, obstructive pulmonary disease, diabetes, psoriasis, oral herpes, fibromyalgia, sleep apnea, hip dysplasia, hx hip bursitis, shoulder bursitis, asthma, bipolar I disorder, obesity, urinary frequency, hyperlipidemia, chronic pain syndrome,  L THA, former smoker, ankle/foot problem, falls with injuries, chronic tachycardia controlled with beta blocker.   Patient denies hx of cancer, stroke, seizures, major cardiac events except chronic tachycardia (now on beta blocker), unexplained weight loss.    Limitations Standing;Lifting;Walking;House hold activities   ADLs,  IADLs, hygene, bending, twisting, wiping, showering, lifting, carrying things, hard to back out and veiw surroundings.   How long can you sit comfortably? pain getting up after prolonged sitting    How long can you stand comfortably? 1 minute    How long can you walk comfortably? 100 feet?    Diagnostic tests Lumbar MRI report 03/29/2020: "IMPRESSION:L2-3: Disc bulge. Bilateral facet degeneration and hypertrophy. Nocompressive stenosis. L3-4: Disc bulge more prominent towards the left. Facet and ligamentous hypertrophy. Left lateral recess stenosis that couldpossibly be symptomatic. L4-5: Chronic facet arthropathy with 6 mm of anterolisthesis. Bulging of the disc. Stenosis of the lateral recesses and foramina but without distinct focal neural compression. L5-S1: Chronic disc degeneration and facet osteoarthritis. No stenosis. Findings could certainly relate to back pain. As far as focal neural compression, the most likely location would seem to be the left lateral recess at L3-4." Cervical/Head MRI report: "IMPRESSION: MRI HEAD IMPRESSION: 1. Scattered multifocal foci of T2/FLAIR hyperintensities involving the left greater than right cerebral hemispheres, compatible with history  of multiple sclerosis. No evidence for active demyelination. 2. Otherwise normal brain MRI for age. MRI CERVICAL SPINE IMPRESSION: 1. Normal MRI appearance of the cervical spinal cord. No evidence for demyelinating disease. 2. Degenerative spondylosis at C4-5 and C5-6 with resultant moderate spinal stenosis, with severe bilateral C5 and C6 foraminal narrowing. 3. Left foraminal disc osteophyte complex at C6-7 with resultant severe left C7 foraminal stenosis. 4. Right eccentric disc bulge with uncovertebral spurring at C3-4 with resultant moderate right C4 foraminal narrowing."  recent radiographs of left clavicle, R ankle, B wrists, and left foot negative for acute problems.    Patient Stated Goals doesn't want to have back surgery    Currently in Pain? Yes    Pain Score 3     Pain Onset More than a month ago             OBJECTIVE  FOTO = 48 (07/25/2020); Marland Kitchen  TREATMENT:   Therapeutic exercise:to centralize symptoms and improve ROM, strength, muscular endurance, and activity tolerance required for successful completion of functional activities.  - sit <> stand progressing to buttocks taps (to 19.5 inch surface), 3x10.  - sloutch-overcorrect x 20 (Feels in glutes and hamstrings).  - standing hip abduction 3x10 each side (  excessive lean to the right when standing on the right).  - hooklying pelvic tilt progressing to pelvic tilt with TrA contraction and with breath in and out. Completed many reps working towards these progression took a break (LTR below) and came back to it for another set of 20 for spaced repetition to reinforce learning - low trunk rotation (LTR) x 20  - Education on HEP including handout and video on pt's own phone of pelvic tilt exercise.    HOME EXERCISE PROGRAM Access Code: [email or web address] URL: [email or web address] Date: 08/08/2020 Prepared by: Rosita Kea  Exercises Sit to Stand without Arm Support - 1 x daily - 3 sets - 10 reps Slouch Overcorrect in  Chair - 2 x daily - 20 reps       PT Education - 08/08/20 1447    Education Details Exercise purpose/form. Education on HEP including handout. POC, imaging, goals of PT    Person(s) Educated Patient    Methods Explanation;Demonstration;Tactile cues;Verbal cues;Handout;Other (comment)   video on pt's own phone   Comprehension Verbalized understanding;Returned demonstration;Verbal cues required;Tactile cues required;Need further instruction            PT Short Term Goals - 07/18/20 1836      PT SHORT TERM GOAL #1   Title Be independent with initial home exercise program for self-management of symptoms.    Baseline to be provided at visit 2 (07/18/2020);    Time 3    Period Weeks    Status New    Target Date 08/08/20             PT Long Term Goals - 07/18/20 1837      PT LONG TERM GOAL #1   Title Be independent with a long-term home exercise program for self-management of symptoms.    Baseline initial HEP to be provided at visit 2 (07/18/2020);    Time 12    Period Weeks    Status New   TARGET DATE FOR ALL LONG TERM GOALS: 10/10/2020     PT LONG TERM GOAL #2   Title Demonstrate improved FOTO score by 10 units to demonstrate improvement in overall condition and self-reported functional ability.    Baseline to be measured visit 2 (07/18/2020);    Time 12    Period Weeks    Status New      PT LONG TERM GOAL #3   Title Patient will demonstrate improvement in 5 Times Sit to Stand Test to equal or less than 15 seconds from 18.5 inch surface with no UE support to demonstrate improved functional strength.    Baseline 26 seconds with hands on knees, (knee pain). Lacks full knee and hip extension (07/18/2020);    Time 12    Period Weeks    Status New      PT LONG TERM GOAL #4   Title Patient will demonstrate cervical spine rotation of equal or greater than 60 degrees each direction to improve ability to check blind spot while driving.    Baseline Rotation: R= 40, L = 32  (07/18/2020);    Time 12    Period Weeks    Status New      PT LONG TERM GOAL #5   Title Complete community, work and/or recreational activities with equal or greater than 50% less limitation due to current condition.    Baseline Difficulty with ADLs,  IADLs, hygene, bending, twisting, wiping, showering, lifting, carrying things, hard to back out and veiw surroundings (07/18/2020);    Time  12    Period Weeks    Status New      Additional Long Term Goals   Additional Long Term Goals Yes      PT LONG TERM GOAL #6   Title Patient will ambulate equal or more than 600 feet on the 6 Minute Walk Test to improve community amublation.    Baseline 300 feet (07/18/2020);    Time 12    Period Weeks    Status New                 Plan - 08/08/20 1446    Clinical Impression Statement Patient tolerated treatment with some complaint of pain at the lower back after getting up and at B hips with hip abduction. Started working on supine pelvic tilt and intraabdominal pressure core muscle control with the goal of progressing to better lumbopelvic control in standing to maximize space for exiting nerves. Patient was able to perform sit <> stand with good balance but complained of knee pain during. Patient required extra encouragement and education to participate in exercises. Stated she felt the session was very productive by the end and was very apologetic about coming late. Discussed strategies for how to not get lost in the future (come by [facility] [address] instead of [address]) and discussed POC and future visits. Patient would benefit from continued management of limiting condition by skilled physical therapist to address remaining impairments and functional limitations to work towards stated goals and return to PLOF or maximal functional independence.    Personal Factors and Comorbidities Age;Behavior Pattern;Comorbidity 3+;Time since onset of injury/illness/exacerbation;Past/Current  Experience;Fitness;Other   financial limitations   Comorbidities Relevant past medical history and comorbidities include progressive/relapsing Multiple Sclerosis, widepread OA, obstructive pulmonary disease, diabetes, psoriasis, oral herpes, fibromyalgia, sleep apnea, hip dysplasia, hx hip bursitis, shoulder bursitis, asthma, bipolar I disorder, obesity, urinary frequency, hyperlipidemia, chronic pain syndrome,  L THA, former smoker, ankle/foot problem, falls with injuries, chronic tachycardia controlled with beta blocker.    Examination-Activity Limitations Bathing;Stand;Lift;Locomotion Level;Toileting;Bend;Transfers;Carry;Sit;Dressing;Squat;Hygiene/Grooming;Stairs    Examination-Participation Restrictions Driving;Laundry;Cleaning    Stability/Clinical Decision Making Evolving/Moderate complexity    Rehab Potential Fair    PT Frequency 2x / week    PT Duration 12 weeks    PT Treatment/Interventions ADLs/Self Care Home Management;Cryotherapy;Moist Heat;Gait training;Stair training;Functional mobility training;Therapeutic activities;DME Instruction;Balance training;Neuromuscular re-education;Cognitive remediation;Patient/family education;Therapeutic exercise;Manual techniques;Passive range of motion;Dry needling;Energy conservation;Joint Manipulations;Spinal Manipulations    PT Next Visit Plan graded exercise, trunk/abdominal control    PT Home Exercise Plan [employer] Access Code: [email or web address]    Consulted and Agree with Plan of Care Patient           Patient will benefit from skilled therapeutic intervention in order to improve the following deficits and impairments:  Abnormal gait, Decreased cognition, Dizziness, Impaired sensation, Improper body mechanics, Pain, Cardiopulmonary status limiting activity, Decreased coordination, Decreased mobility, Hypermobility, Impaired tone, Decreased activity tolerance, Decreased endurance, Decreased range of motion, Decreased strength, Hypomobility, Impaired  perceived functional ability, Decreased balance, Difficulty walking, Impaired flexibility, Obesity  Visit Diagnosis: Chronic bilateral low back pain with bilateral sciatica  Cervicalgia  Muscle weakness (generalized)  Difficulty in walking, not elsewhere classified  History of falling     Problem List Patient Active Problem List   Diagnosis Date Noted  . Oral pain 07/31/2020  . Marijuana use 11/28/2019  . Psoriasis 11/02/2019  . Rash and nonspecific skin eruption 02/20/2019  . Asthma 02/10/2019  . Epigastric pain 12/21/2018  . OSA (obstructive sleep apnea) 12/02/2018  .  Joint swelling 10/24/2018  . Environmental and seasonal allergies 10/24/2018  . Greater trochanteric bursitis of right hip 09/28/2018  . Prediabetes 07/22/2018  . Bipolar I disorder, most recent episode (or current) manic ([place]) 05/20/2018  . Vitamin D deficiency 02/15/2018  . Preventative health care 11/24/2017  . Urinary frequency 03/30/2017  . B12 deficiency 05/22/2016  . Dizziness 03/16/2016  . Medicare annual wellness visit, subsequent 10/25/2015  . Chronic back pain 10/31/2014  . Weight gain 06/21/2014  . Bipolar disorder ([place]) 05/21/2014  . 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 Hip dysplasia, congenital 09/15/2013  . 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 Insomnia 09/01/2011  . Obesity 05/04/2011  . HLD (hyperlipidemia) 01/20/2011  . Tobacco abuse 01/20/2011  . Chest pain 01/20/2011  . Chronic pain syndrome 01/07/2011  . RENAL CALCULUS, RECURRENT 11/13/2010  . MULTIPLE SCLEROSIS, PROGRESSIVE/RELAPSING 08/28/2010  . GERD 08/28/2010  . Osteoarthritis 08/28/2010  . NEPHROLITHIASIS, HX OF 08/28/2010   Everlean Alstrom. Graylon Good, PT, DPT 08/08/20, 2:49 PM  [facility] [address] [place], [address] Phone: [phone]   Fax:  [phone]  Name: JEANETT ANTONOPOULOS MRN: [id number] Date of Birth:  Jul 09, 1961

## 2020-08-13 ENCOUNTER — Encounter: Payer: PPO | Admitting: Physical Therapy

## 2020-08-15 ENCOUNTER — Ambulatory Visit: Payer: PPO | Admitting: Physical Therapy

## 2020-08-16 ENCOUNTER — Other Ambulatory Visit: Payer: Self-pay | Admitting: Internal Medicine

## 2020-08-16 DIAGNOSIS — L509 Urticaria, unspecified: Secondary | ICD-10-CM

## 2020-08-18 ENCOUNTER — Other Ambulatory Visit: Payer: Self-pay | Admitting: Primary Care

## 2020-08-18 DIAGNOSIS — M159 Polyosteoarthritis, unspecified: Secondary | ICD-10-CM

## 2020-08-18 DIAGNOSIS — G8929 Other chronic pain: Secondary | ICD-10-CM

## 2020-08-18 DIAGNOSIS — M545 Low back pain, unspecified: Secondary | ICD-10-CM

## 2020-08-19 ENCOUNTER — Encounter: Payer: PPO | Admitting: Physical Therapy

## 2020-08-20 ENCOUNTER — Other Ambulatory Visit: Payer: Self-pay | Admitting: Primary Care

## 2020-08-20 DIAGNOSIS — J3089 Other allergic rhinitis: Secondary | ICD-10-CM

## 2020-08-22 NOTE — Telephone Encounter (Signed)
Looks like you are pcp and have given script in the past but last several visits have been with Dr. Edilia Bo ? Please advise

## 2020-08-28 ENCOUNTER — Encounter: Payer: Self-pay | Admitting: Primary Care

## 2020-08-28 ENCOUNTER — Ambulatory Visit (INDEPENDENT_AMBULATORY_CARE_PROVIDER_SITE_OTHER): Payer: PPO | Admitting: Primary Care

## 2020-08-28 ENCOUNTER — Other Ambulatory Visit: Payer: Self-pay

## 2020-08-28 VITALS — BP 110/60 | HR 90 | Temp 97.6°F | Ht 69.0 in | Wt 245.0 lb

## 2020-08-28 DIAGNOSIS — E119 Type 2 diabetes mellitus without complications: Secondary | ICD-10-CM

## 2020-08-28 DIAGNOSIS — E1165 Type 2 diabetes mellitus with hyperglycemia: Secondary | ICD-10-CM | POA: Insufficient documentation

## 2020-08-28 DIAGNOSIS — E538 Deficiency of other specified B group vitamins: Secondary | ICD-10-CM

## 2020-08-28 DIAGNOSIS — E1159 Type 2 diabetes mellitus with other circulatory complications: Secondary | ICD-10-CM | POA: Insufficient documentation

## 2020-08-28 LAB — BASIC METABOLIC PANEL
BUN: 23 mg/dL (ref 6–23)
CO2: 29 mEq/L (ref 19–32)
Calcium: 9.1 mg/dL (ref 8.4–10.5)
Chloride: 100 mEq/L (ref 96–112)
Creatinine, Ser: 0.85 mg/dL (ref 0.40–1.20)
GFR: 75.32 mL/min (ref >60.00)
Glucose, Bld: 122 mg/dL — ABNORMAL HIGH (ref 70–99)
Potassium: 4.2 mEq/L (ref 3.5–5.1)
Sodium: 139 mEq/L (ref 135–145)

## 2020-08-28 LAB — VITAMIN B12: Vitamin B-12: 288 pg/mL (ref 211–911)

## 2020-08-28 MED ORDER — CYANOCOBALAMIN 1000 MCG/ML IJ SOLN
1000.0000 ug | Freq: Once | INTRAMUSCULAR | Status: AC
  Administered 2020-08-28: 1000 ug via INTRAMUSCULAR

## 2020-08-28 MED ORDER — TRULICITY 0.75 MG/0.5ML ~~LOC~~ SOAJ: 0.7500 mg | SUBCUTANEOUS | 0 refills | Status: DC

## 2020-08-28 NOTE — Assessment & Plan Note (Signed)
Noncompliant to monthly injections. She would like for the injections to be sent to the pharmacy, she feels comfortable administering them at home.  Repeat B12 level pending today. Will consider sending B12 injections to pharmacy.

## 2020-08-28 NOTE — Patient Instructions (Signed)
Stop by the lab prior to leaving today. I will notify you of your results once received.   Start Trulicity injection once weekly for diabetes. Rotate sites of injections.  It is important that you improve your diet. Please limit carbohydrates in the form of white bread, rice, pasta, sweets, fast food, fried food, sugary drinks, etc. Increase your consumption of fresh fruits and vegetables, whole grains, lean protein.  Ensure you are consuming 64 ounces of water daily.  Please schedule a lab appointment for diabetes check in 3 months.  It was a pleasure to see you today!

## 2020-08-28 NOTE — Assessment & Plan Note (Signed)
Recent A1c of 6.7 which is a slight improvement from prior.  She refuses to take Metformin any longer, we will not force this.  Prescription for Trulicity 0.5 mg sent to pharmacy for her to inject weekly.  Discussed potential side effects.  Repeat A1c in 3 months, lab appointment. Strongly encouraged her to work on diet and regular exercise.

## 2020-08-28 NOTE — Progress Notes (Signed)
Subjective:    Patient ID: Emily Moon, female    DOB: 01-Jan-1961, 59 y.o.   MRN: [id number]  HPI  This visit occurred during the SARS-CoV-2 public health emergency.  Safety protocols were in place, including screening questions prior to the visit, additional usage of staff PPE, and extensive cleaning of exam room while observing appropriate contact time as indicated for disinfecting solutions.   Emily Moon is a 59 year old female with a significant medical history including type 2 diabetes, asthma, OSA, chronic pain syndrome, osteoarthritis, multiple sclerosis, renal stones, hyperlipidemia, tobacco abuse, Bipolar Disorder who presents today for follow up of diabetes. She is also due for b12 injection.  Current medications include: Metformin XR 500 mg daily. She is refusing to take her Metformin from this day forward due to "amnesia" symptoms.  She would like to try Trulicity.  She is checking her blood glucose numerous times daily and is getting readings ranging 150's.   Last A1C: 6.7 in September 2021 Last Eye Exam: UTD Last Foot Exam: UTD Pneumonia Vaccination: Completed in 2016 ACE/ARB: None. Urine microalbumin  Statin: Intolerant, Repatha  BP Readings from Last 3 Encounters:  08/28/20 110/60  07/18/20 126/90  06/19/20 110/70   Wt Readings from Last 3 Encounters:  08/28/20 245 lb (111.1 kg)  07/31/20 241 lb (109.3 kg)  06/19/20 241 lb 12 oz (109.7 kg)      Review of Systems  Respiratory: Positive for shortness of breath.   Cardiovascular: Negative for chest pain.  Neurological: Negative for dizziness and headaches.       Past Medical History:  Diagnosis Date  . Arthritis    osteo  . Asthma   . Bipolar disorder ([place]) 05/21/14  . Cataracts, bilateral   . DDD (degenerative disc disease), cervical    also back  . Depression   . Dizziness    Positional  . Edema    feet/legs  . Fibromyalgia syndrome   . Fungal infection    Finger nails  . GERD  (gastroesophageal reflux disease)   . Gout   . Headache    seasonal allergies  . Heart palpitations   . 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 Hypercholesterolemia   . Multiple sclerosis (HCC)    weakness  . Nephrolithiasis    kidney stones  . Osteoporosis    osteoarthritis  . Pneumonia   . PONV (postoperative nausea and vomiting)    no problem after cataract surgery  . Psoriasis   . Renal stone   . Shortness of breath dyspnea    wheezing  . Sleep apnea 2012   sleep study / slight, no interventions  . Urinary frequency      Social History   Socioeconomic History  . Marital status: Divorced    Spouse name: Not on file  . Number of children: 1  . Years of education: Not on file  . Highest education level: Not on file  Occupational History  . 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 Smoking status: Former Smoker    Years: 25.00    Types: Cigarettes    Quit date: 11/16/2012    Years since quitting: 7.7  . Smokeless tobacco: Never Used  . Tobacco comment: occasional use  Vaping Use  . Vaping Use: Former  Substance and Sexual Activity  . Alcohol use: No    Alcohol/week: 0.0 standard drinks  . Drug use: No    Types: Methylphenidate  .  Sexual activity: Never  Other Topics Concern  . Not on file  Social History Narrative  . Not on file   Social Determinants of Health   Financial Resource Strain: Low Risk   . Difficulty of Paying Living Expenses: Not hard at all  Food Insecurity: No Food Insecurity  . Worried About Charity fundraiser in the Last Year: Never true  . Ran Out of Food in the Last Year: Never true  Transportation Needs: No Transportation Needs  . Lack of Transportation (Medical): No  . Lack of Transportation (Non-Medical): No  Physical Activity: Inactive  . Days of Exercise per Week: 0 days  . Minutes of Exercise per Session: 0 min  Stress: Stress Concern Present  . Feeling of Stress : Very much  Social  Connections:   . Frequency of Communication with Friends and Family: Not on file  . Frequency of Social Gatherings with Friends and Family: Not on file  . Attends Religious Services: Not on file  . Active Member of Clubs or Organizations: Not on file  . Attends Archivist Meetings: Not on file  . Marital Status: Not on file  Intimate Partner Violence: Not At Risk  . Fear of Current or Ex-Partner: No  . Emotionally Abused: No  . Physically Abused: No  . Sexually Abused: No    Past Surgical History:  Procedure Laterality Date  . CATARACT EXTRACTION W/PHACO Left 05/21/2015   Procedure: CATARACT EXTRACTION PHACO AND INTRAOCULAR LENS PLACEMENT (IOC);  Surgeon: Birder Robson, MD;  Location: [facility];  Service: Ophthalmology;  Laterality: Left;  [place] 00:35 AP% 22.9 CDE 8.11 fluid pack lot #[id number]  . CATARACT EXTRACTION W/PHACO Right 06/04/2015   Procedure: CATARACT EXTRACTION PHACO AND INTRAOCULAR LENS PLACEMENT (IOC);  Surgeon: Birder Robson, MD;  Location: [facility];  Service: Ophthalmology;  Laterality: Right;  US:00:48 AP%: 10.5 CDE:5.08 Fluid lot #[id number]  . CYSTOSCOPY/URETEROSCOPY/HOLMIUM LASER/STENT PLACEMENT Bilateral 09/22/2016   Procedure: CYSTOSCOPY/URETEROSCOPY/HOLMIUM LASER/STENT PLACEMENT;  Surgeon: Hollice Espy, MD;  Location: [facility];  Service: Urology;  Laterality: Bilateral;  . EYE SURGERY  2015   tissue biopsy  . FOOT SURGERY  2015  . JOINT REPLACEMENT Left 2013   hip replacement  . LITHOTRIPSY    . PTOSIS REPAIR Bilateral 02/18/2016   Procedure: BILATERAL PTOSIS REPAIR UPPER EYELIDS;  Surgeon: Karle Starch, MD;  Location: [facility];  Service: Ophthalmology;  Laterality: Bilateral;  LEAVE PT EARLY AM  . thumb surgery Right   . TONSILLECTOMY  1973    Family History  Problem Relation Age of Onset  . Cancer Father        Abdomen with mastasis  . Cancer Mother   . Heart disease Mother   . Kidney disease Neg Hx   . Bladder Cancer Neg Hx     . Prostate cancer Neg Hx   . Kidney cancer Neg Hx     Allergies  Allergen Reactions  . Albuterol Shortness Of Breath and Other (See Comments)    Makes pt feel jittery/ tacycardic  . Crestor [Rosuvastatin] Other (See Comments)    Joint pain, muscle pain, and hair loss  . Halcion [Triazolam] Other (See Comments)    Dizziness,headaches,bladder problems  . Levaquin [Levofloxacin In D5w] Diarrhea and Itching    Shoulder pain  . Naproxen Sodium Swelling    Patient tolerates in small doses  . Tylenol [Acetaminophen] Swelling    Patient tolerates in small doses  . Cefaclor Other (See Comments)    Doesn't  remember---unsure if actually allergic   . Diclofenac Sodium Other (See Comments)    "made very sick"  . Sulfa Antibiotics Itching    Unsure of reaction possibly itching Unsure of reaction possibly itching  . Tramadol Itching and Nausea And Vomiting  . Aripiprazole Other (See Comments)    Muscle tension/cramping  . Diclofenac Sodium Rash    "made very sick"  . Ibuprofen Swelling    Patient tolerates in small doses    Current Outpatient Medications on File Prior to Visit  Medication Sig Dispense Refill  . ASPERCREME LIDOCAINE EX Apply 1 application topically 4 (four) times daily as needed (for pain.).     Marland Kitchen aspirin 325 MG tablet Take 325 mg by mouth every 4 (four) hours as needed for headache.     Marland Kitchen azelastine (ASTELIN) 0.1 % nasal spray PLACE 2 SPRAYS INTO BOTH NOSTRILS 2 (TWO) TIMES DAILY 30 mL 1  . BIOTIN PO Take 200 mg by mouth daily.     . budesonide-formoterol (SYMBICORT) 80-4.5 MCG/ACT inhaler Inhale 2 puffs into the lungs 2 (two) times daily. 1 each 6  . calcipotriene-betamethasone (TACLONEX) ointment Apply topically daily. 60 g 0  . cetirizine (ZYRTEC) 10 MG tablet Take 10 mg by mouth 2 (two) times daily.    . Cholecalciferol (VITAMIN D3) 10000 units capsule Take 30,000 Units by mouth daily.     . ciclopirox (LOPROX) 0.77 % cream Apply topically 2 (two) times daily. 30 g  0  . clobetasol (TEMOVATE) 0.05 % external solution Mix clobetasol solution with  CeraVe cream Use twice daily to affected areas.Avoid Face, groin and underarm 50 mL 0  . cyanocobalamin (,VITAMIN B-12,) 1000 MCG/ML injection Inject 1 mL (1,000 mcg total) into the muscle every 3 (three) months. 1 mL 0  . diclofenac sodium (VOLTAREN) 1 % GEL Apply 4 g topically 4 (four) times daily. 500 g 5  . docusate sodium (COLACE) 100 MG capsule Take 1 capsule (100 mg total) by mouth 2 (two) times daily. 60 capsule 0  . DULoxetine (CYMBALTA) 20 MG capsule TAKE 2 CAPSULES BY MOUTH EVERY DAY 180 capsule 2  . Evolocumab (REPATHA SURECLICK) 119 MG/ML SOAJ Inject 1 Dose into the skin every 14 (fourteen) days. 2 pen 11  . famotidine (PEPCID) 20 MG tablet TAKE 1 TABLET BY MOUTH EVERYDAY AT BEDTIME 30 tablet 0  . fluocinonide (LIDEX) 0.05 % external solution Apply 1 application topically 2 (two) times daily as needed. Apply to scalp twice daily as need for itch 180 mL 3  . hydrocortisone 2.5 % cream Apply BID to affected area in underarms and groin prn flares    . hydroquinone 4 % cream 1 APPLICATION TOPICALLY DAILY AS NEEDED FOR BLEMISHES.  3  . hydrOXYzine (VISTARIL) 50 MG capsule TAKE 1 CAPSLE BY MOUTH AT NOON, 1 AT 6 PM, AND 1 AS NEEDED 270 capsule 2  . lamoTRIgine (LAMICTAL) 150 MG tablet Take 2 tablets (300 mg total) by mouth at bedtime. 180 tablet 1  . levalbuterol (XOPENEX HFA) 45 MCG/ACT inhaler Inhale 1-2 puffs into the lungs every 6 (six) hours as needed for wheezing. 15 g 0  . levalbuterol (XOPENEX) 1.25 MG/3ML nebulizer solution USE 1 VIAL VIA NEBULIZER EVERY 4 HOURS AS NEEDED FOR WHEEZING OR SHORTNESS OF BREATH 270 mL 1  . Lysine 1000 MG TABS Take 2,000-4,000 mg by mouth at bedtime. 2000 mg scheduled at bedtime and patient will take 4000 mg if she has outbreak    . Magnesium 500 MG  TABS Take 1 tablet by mouth at bedtime.    . methocarbamol (ROBAXIN) 500 MG tablet TAKE 1 TABLET (500 MG TOTAL) BY MOUTH EVERY 8  (EIGHT) HOURS AS NEEDED FOR MUSCLE SPASMS. 90 tablet 0  . metoprolol succinate (TOPROL-XL) 50 MG 24 hr tablet Take 1 tablet (50 mg total) by mouth daily. Take with or immediately following a meal. 90 tablet 1  . neomycin-bacitracin-polymyxin (NEOSPORIN) [phone] ointment Apply 1 application topically 4 (four) times daily as needed (for cut/scrapes.).    Marland Kitchen Olopatadine HCl 0.2 % SOLN 1 drop once daily    . pantoprazole (PROTONIX) 40 MG tablet TAKE 1 TABLET BY MOUTH EVERY DAY 90 tablet 1  . phenazopyridine (AZO-TABS) 95 MG tablet Take 95 mg by mouth 3 (three) times daily as needed for pain.    . polyethylene glycol powder (GLYCOLAX/MIRALAX) powder MIX 17 GRAMS (1 CAPFUL) WITH 4-8 OZ OF LIQUID AND TAKE BY MOUTH TWICE DAILY AS NEEDED 527 g 0  . temazepam (RESTORIL) 30 MG capsule TAKE ONE TO TWO CAPSULES BY MOUTH NIGHTLY AT BEDTIME 60 capsule 4  . traZODone (DESYREL) 50 MG tablet Take 2 tablets (100 mg total) by mouth at bedtime. 60 tablet 7  . valACYclovir (VALTREX) 1000 MG tablet Take 1 tablet (1,000 mg total) by mouth 2 (two) times daily. 10 tablet 0   No current facility-administered medications on file prior to visit.    BP 110/60   Pulse 90   Temp 97.6 F (36.4 C)   Ht 5\' 9"  (1.753 m)   Wt 245 lb (111.1 kg)   LMP 08/23/2014   SpO2 96%   BMI 36.18 kg/m    Objective:   Physical Exam Cardiovascular:     Rate and Rhythm: Normal rate and regular rhythm.  Pulmonary:     Effort: Pulmonary effort is normal.     Breath sounds: Normal breath sounds.  Musculoskeletal:     Cervical back: Neck supple.  Skin:    General: Skin is warm and dry.  Neurological:     Mental Status: She is alert.            Assessment & Plan:

## 2020-09-02 ENCOUNTER — Ambulatory Visit: Payer: PPO | Admitting: Cardiology

## 2020-09-02 NOTE — Telephone Encounter (Signed)
Emily Moon, please schedule her for B12 injections every other month x6 months with repeat lab test for B12 in 6 months.

## 2020-09-09 ENCOUNTER — Ambulatory Visit: Payer: PPO | Admitting: Physical Therapy

## 2020-09-10 ENCOUNTER — Telehealth: Payer: Self-pay

## 2020-09-10 ENCOUNTER — Encounter: Payer: PPO | Admitting: Physical Therapy

## 2020-09-10 NOTE — Telephone Encounter (Signed)
Called patient to cancel appointment.

## 2020-09-10 NOTE — Telephone Encounter (Signed)
[facility] Night - Client Nonclinical Telephone Record [facility] Client [place] Night - Client Client Site [place] Physician [name] - NP Contact Type Call Who Is Calling Patient / Member / Family / Caregiver Caller Name [place] Phone Number [phone] Patient Name Emily Moon Patient DOB 14-Sep-1961 Call Type Message Only Information Provided Reason for Call Request to [facility] Appointment Initial Comment Caller needs to cancel her appointment. Additional Comment Caller states that she needs to cancel appointment for Wednesday at noon. Caller states that she needs a call back to verify cancellation or upload to my chart. Provided office hours. Disp. Time Disposition Final User 09/09/2020 5:12:40 PM General Information Provided Yes Chance, Haley Call Closed By: Cecille Aver Transaction Date/Time: 09/09/2020 5:08:39 PM (ET)

## 2020-09-11 ENCOUNTER — Ambulatory Visit: Payer: PPO | Admitting: Primary Care

## 2020-09-11 ENCOUNTER — Telehealth (INDEPENDENT_AMBULATORY_CARE_PROVIDER_SITE_OTHER): Payer: PPO | Admitting: Psychiatry

## 2020-09-11 ENCOUNTER — Encounter: Payer: PPO | Admitting: Physical Therapy

## 2020-09-11 ENCOUNTER — Other Ambulatory Visit: Payer: Self-pay

## 2020-09-11 DIAGNOSIS — F3130 Bipolar disorder, current episode depressed, mild or moderate severity, unspecified: Secondary | ICD-10-CM | POA: Diagnosis not present

## 2020-09-11 MED ORDER — TEMAZEPAM 30 MG PO CAPS
ORAL_CAPSULE | ORAL | 4 refills | Status: DC
Start: 2020-09-11 — End: 2020-11-27

## 2020-09-11 MED ORDER — LAMOTRIGINE 150 MG PO TABS
300.0000 mg | ORAL_TABLET | Freq: Every day | ORAL | 1 refills | Status: DC
Start: 2020-09-11 — End: 2020-11-27

## 2020-09-11 MED ORDER — DULOXETINE HCL 20 MG PO CPEP
40.0000 mg | ORAL_CAPSULE | Freq: Every day | ORAL | 2 refills | Status: DC
Start: 2020-09-11 — End: 2020-11-27

## 2020-09-11 MED ORDER — HYDROXYZINE PAMOATE 50 MG PO CAPS
ORAL_CAPSULE | ORAL | 2 refills | Status: DC
Start: 2020-09-11 — End: 2020-11-27

## 2020-09-11 MED ORDER — DULOXETINE HCL 20 MG PO CPEP
40.0000 mg | ORAL_CAPSULE | Freq: Every day | ORAL | 2 refills | Status: DC
Start: 2020-09-11 — End: 2020-09-11

## 2020-09-11 MED ORDER — TRAZODONE HCL 50 MG PO TABS
100.0000 mg | ORAL_TABLET | Freq: Every day | ORAL | 7 refills | Status: DC
Start: 2020-09-11 — End: 2020-11-27

## 2020-09-11 NOTE — Progress Notes (Signed)
The most painful.  This  Psychiatric Initial Adult Assessment   Patient Identification: Emily Moon MRN:  [id number] Date of Evaluation:  09/11/2020 Referral Source: From the community Chief Complaint: Feels exhausted    Today the patient is only doing fairly well.  She has multiple medical issues.  She has chronic back pain but that seems to be a little bit less.  Not clear how the MS is doing with her either.  The patient was recently started on medicines for cholesterol.  She also has been diagnosed with diabetes.  Her biggest complaint is that of weight gain of almost 50 pounds.  She takes a medicine for her lung condition as well.  In essence the patient has multiple system involvement.  She has lung disease, she is being treated with a beta-blocker for unexplained hypertension and she says she is recently plaque psoriasis.  The patient has a wealthy friend who has somebody to come to help her and spend time with her at least 2 days a week.  The patient says she feels very depressed and just yesterday to get back on desipramine that we started.  At first it was thought it might be related to her tachycardia but it is not.  So in essence the patient is taking Cymbalta as well 40 mg.  Cymbalta together with desipramine her mind hopes to treat her depression.  She no longer is on a benzodiazepine she no longer is on a pain medication she does not drink or smoke.  She takes Vistaril on a as needed basis.  Patient: Emily Moon  Procedure(s) Performed: * No surgery found *  Anesthes  Patient location:   Post pain:   Post assessment:   Last Vitals:  There were no vitals filed for this visit.  Post vital signs:   Level of consciousness:   Complications: . Associated Signs/Symptoms: Depression Symptoms:  hopelessness, (Hypo) Manic Symptoms:   Anxiety Symptoms:   Psychotic Symptoms:   PTSD Symptoms:   Past Psychiatric History: The patient was seen in a psychiatric  office and takes multiple medications she's not been recently hospitalized.  Previous Psychotropic Medications: Yes   Substance Abuse History in the last 12 months:  No.  Consequences of Substance Abuse: Negative  Past Medical History:  Past Medical History:  Diagnosis Date   Arthritis    osteo   Asthma    Bipolar disorder ([place]) 05/21/14   Cataracts, bilateral    DDD (degenerative disc disease), cervical    also back   Depression    Dizziness    Positional   Edema    feet/legs   Fibromyalgia syndrome    Fungal infection    Finger nails   GERD (gastroesophageal reflux disease)    Gout    Headache    seasonal allergies   Heart palpitations    Hip dysplasia, congenital 09/15/2013   Hypercholesterolemia    Multiple sclerosis (HCC)    weakness   Nephrolithiasis    kidney stones   Osteoporosis    osteoarthritis   Pneumonia    PONV (postoperative nausea and vomiting)    no problem after cataract surgery   Psoriasis    Renal stone    Shortness of breath dyspnea    wheezing   Sleep apnea 2012   sleep study / slight, no interventions   Urinary frequency     Past Surgical History:  Procedure Laterality Date   CATARACT EXTRACTION W/PHACO Left 05/21/2015   Procedure: CATARACT  EXTRACTION PHACO AND INTRAOCULAR LENS PLACEMENT (IOC);  Surgeon: Birder Robson, MD;  Location: [facility];  Service: Ophthalmology;  Laterality: Left;  [place] 00:35 AP% 22.9 CDE 8.11 fluid pack lot #[id number]   CATARACT EXTRACTION W/PHACO Right 06/04/2015   Procedure: CATARACT EXTRACTION PHACO AND INTRAOCULAR LENS PLACEMENT (IOC);  Surgeon: Birder Robson, MD;  Location: [facility];  Service: Ophthalmology;  Laterality: Right;  US:00:48 AP%: 10.5 CDE:5.08 Fluid lot #[id number]   CYSTOSCOPY/URETEROSCOPY/HOLMIUM LASER/STENT PLACEMENT Bilateral 09/22/2016   Procedure: CYSTOSCOPY/URETEROSCOPY/HOLMIUM LASER/STENT PLACEMENT;  Surgeon: Hollice Espy, MD;  Location: [facility];   Service: Urology;  Laterality: Bilateral;   EYE SURGERY  2015   tissue biopsy   FOOT SURGERY  2015   JOINT REPLACEMENT Left 2013   hip replacement   LITHOTRIPSY     PTOSIS REPAIR Bilateral 02/18/2016   Procedure: BILATERAL PTOSIS REPAIR UPPER EYELIDS;  Surgeon: Karle Starch, MD;  Location: [facility];  Service: Ophthalmology;  Laterality: Bilateral;  LEAVE PT EARLY AM   thumb surgery Right    TONSILLECTOMY  1973    Family Psychiatric History:   Family History:  Family History  Problem Relation Age of Onset   Cancer Father        Abdomen with mastasis   Cancer Mother    Heart disease Mother    Kidney disease Neg Hx    Bladder Cancer Neg Hx    Prostate cancer Neg Hx    Kidney cancer Neg Hx     Social History:   Social History   Socioeconomic History   Marital status: Divorced    Spouse name: Not on file   Number of children: 1   Years of education: Not on file   Highest education level: Not on file  Occupational History   Occupation: Therapist, 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  Tobacco Use   Smoking status: Former Smoker    Years: 25.00    Types: Cigarettes    Quit date: 11/16/2012    Years since quitting: 7.8   Smokeless tobacco: Never Used   Tobacco comment: occasional use  Vaping Use   Vaping Use: Former  Substance and Sexual Activity   Alcohol use: No    Alcohol/week: 0.0 standard drinks   Drug use: No    Types: Methylphenidate   Sexual activity: Never  Other Topics Concern   Not on file  Social History Narrative   Not on file   Social Determinants of Health   Financial Resource Strain: Low Risk    Difficulty of Paying Living Expenses: Not hard at all  Food Insecurity: No Food Insecurity   Worried About Charity fundraiser in the Last Year: Never true   [place] in the Last Year: Never true  Transportation Needs: No Transportation Needs   Lack of Transportation (Medical): No   Lack of  Transportation (Non-Medical): No  Physical Activity: Inactive   Days of Exercise per Week: 0 days   Minutes of Exercise per Session: 0 min  Stress: Stress Concern Present   Feeling of Stress : Very much  Social Connections:    Frequency of Communication with Friends and Family: Not on file   Frequency of Social Gatherings with Friends and Family: Not on file   Attends Religious Services: Not on file   Active Member of Clubs or Organizations: Not on file   Attends Archivist Meetings: Not on file   Marital Status: Not on file  Additional Social History:   Allergies:   Allergies  Allergen Reactions   Albuterol Shortness Of Breath and Other (See Comments)    Makes pt feel jittery/ tacycardic   Crestor [Rosuvastatin] Other (See Comments)    Joint pain, muscle pain, and hair loss   Halcion [Triazolam] Other (See Comments)    Dizziness,headaches,bladder problems   Levaquin [Levofloxacin In D5w] Diarrhea and Itching    Shoulder pain   Naproxen Sodium Swelling    Patient tolerates in small doses   Tylenol [Acetaminophen] Swelling    Patient tolerates in small doses   Cefaclor Other (See Comments)    Doesn't remember---unsure if actually allergic    Diclofenac Sodium Other (See Comments)    "made very sick"   Sulfa Antibiotics Itching    Unsure of reaction possibly itching Unsure of reaction possibly itching   Tramadol Itching and Nausea And Vomiting   Aripiprazole Other (See Comments)    Muscle tension/cramping   Diclofenac Sodium Rash    "made very sick"   Ibuprofen Swelling    Patient tolerates in small doses    Metabolic Disorder Labs: Lab Results  Component Value Date   HGBA1C 6.7 (H) 08/01/2020   No results found for: PROLACTIN Lab Results  Component Value Date   CHOL 131 08/01/2020   TRIG 352.0 (H) 08/01/2020   HDL 46.80 08/01/2020   CHOLHDL 3 08/01/2020   VLDL 70.4 (H) 08/01/2020   LDLCALC 223 (H) 11/13/2014     Current  Medications: Current Outpatient Medications  Medication Sig Dispense Refill   ASPERCREME LIDOCAINE EX Apply 1 application topically 4 (four) times daily as needed (for pain.).      aspirin 325 MG tablet Take 325 mg by mouth every 4 (four) hours as needed for headache.      azelastine (ASTELIN) 0.1 % nasal spray PLACE 2 SPRAYS INTO BOTH NOSTRILS 2 (TWO) TIMES DAILY 30 mL 1   BIOTIN PO Take 200 mg by mouth daily.      budesonide-formoterol (SYMBICORT) 80-4.5 MCG/ACT inhaler Inhale 2 puffs into the lungs 2 (two) times daily. 1 each 6   calcipotriene-betamethasone (TACLONEX) ointment Apply topically daily. 60 g 0   cetirizine (ZYRTEC) 10 MG tablet Take 10 mg by mouth 2 (two) times daily.     Cholecalciferol (VITAMIN D3) 10000 units capsule Take 30,000 Units by mouth daily.      ciclopirox (LOPROX) 0.77 % cream Apply topically 2 (two) times daily. 30 g 0   clobetasol (TEMOVATE) 0.05 % external solution Mix clobetasol solution with  CeraVe cream Use twice daily to affected areas.Avoid Face, groin and underarm 50 mL 0   cyanocobalamin (,VITAMIN B-12,) 1000 MCG/ML injection Inject 1 mL (1,000 mcg total) into the muscle every 3 (three) months. 1 mL 0   diclofenac sodium (VOLTAREN) 1 % GEL Apply 4 g topically 4 (four) times daily. 500 g 5   docusate sodium (COLACE) 100 MG capsule Take 1 capsule (100 mg total) by mouth 2 (two) times daily. 60 capsule 0   Dulaglutide (TRULICITY) 6.56 CL/2.7NT SOPN Inject 0.75 mg into the skin once a week. For diabetes. 6 mL 0   DULoxetine (CYMBALTA) 20 MG capsule Take 2 capsules (40 mg total) by mouth daily. 180 capsule 2   Evolocumab (REPATHA SURECLICK) 700 MG/ML SOAJ Inject 1 Dose into the skin every 14 (fourteen) days. 2 pen 11   famotidine (PEPCID) 20 MG tablet TAKE 1 TABLET BY MOUTH EVERYDAY AT BEDTIME 30 tablet 0   fluocinonide (  LIDEX) 0.05 % external solution Apply 1 application topically 2 (two) times daily as needed. Apply to scalp twice daily as  need for itch 180 mL 3   hydrocortisone 2.5 % cream Apply BID to affected area in underarms and groin prn flares     hydroquinone 4 % cream 1 APPLICATION TOPICALLY DAILY AS NEEDED FOR BLEMISHES.  3   hydrOXYzine (VISTARIL) 50 MG capsule TAKE 1 CAPSLE BY MOUTH AT NOON, 1 AT 6 PM, AND 1 AS NEEDED 270 capsule 2   lamoTRIgine (LAMICTAL) 150 MG tablet Take 2 tablets (300 mg total) by mouth at bedtime. 180 tablet 1   levalbuterol (XOPENEX HFA) 45 MCG/ACT inhaler Inhale 1-2 puffs into the lungs every 6 (six) hours as needed for wheezing. 15 g 0   levalbuterol (XOPENEX) 1.25 MG/3ML nebulizer solution USE 1 VIAL VIA NEBULIZER EVERY 4 HOURS AS NEEDED FOR WHEEZING OR SHORTNESS OF BREATH 270 mL 1   Lysine 1000 MG TABS Take 2,000-4,000 mg by mouth at bedtime. 2000 mg scheduled at bedtime and patient will take 4000 mg if she has outbreak     Magnesium 500 MG TABS Take 1 tablet by mouth at bedtime.     methocarbamol (ROBAXIN) 500 MG tablet TAKE 1 TABLET (500 MG TOTAL) BY MOUTH EVERY 8 (EIGHT) HOURS AS NEEDED FOR MUSCLE SPASMS. 90 tablet 0   metoprolol succinate (TOPROL-XL) 50 MG 24 hr tablet Take 1 tablet (50 mg total) by mouth daily. Take with or immediately following a meal. 90 tablet 1   neomycin-bacitracin-polymyxin (NEOSPORIN) [phone] ointment Apply 1 application topically 4 (four) times daily as needed (for cut/scrapes.).     Olopatadine HCl 0.2 % SOLN 1 drop once daily     pantoprazole (PROTONIX) 40 MG tablet TAKE 1 TABLET BY MOUTH EVERY DAY 90 tablet 1   phenazopyridine (AZO-TABS) 95 MG tablet Take 95 mg by mouth 3 (three) times daily as needed for pain.     polyethylene glycol powder (GLYCOLAX/MIRALAX) powder MIX 17 GRAMS (1 CAPFUL) WITH 4-8 OZ OF LIQUID AND TAKE BY MOUTH TWICE DAILY AS NEEDED 527 g 0   temazepam (RESTORIL) 30 MG capsule TAKE ONE TO TWO CAPSULES BY MOUTH NIGHTLY AT BEDTIME 60 capsule 4   traZODone (DESYREL) 50 MG tablet Take 2 tablets (100 mg total) by mouth at  bedtime. 60 tablet 7   valACYclovir (VALTREX) 1000 MG tablet Take 1 tablet (1,000 mg total) by mouth 2 (two) times daily. 10 tablet 0   No current facility-administered medications for this visit.    Neurologic: Headache: No Seizure: No Paresthesias:No  Musculoskeletal: Strength & Muscle Tone: within normal limits Gait & Station: normal Patient leans: N/A  Psychiatric Specialty Exam: ROS  Last menstrual period 08/23/2014.There is no height or weight on file to calculate BMI.  General Appearance: Casual  Eye Contact:  Good  Speech:  Clear and Coherent  Volume:  Normal  Mood:  Negative  Affect:  Appropriate  Thought Process:  Goal Directed  Orientation:  NA  Thought Content:  Logical  Suicidal Thoughts:  No  Homicidal Thoughts:  No  Memory:  Negative  Judgement:  Good  Insight:  Good  Psychomotor Activity:  Normal  Concentration:    Recall:    Fund of Knowledge:Good  Language: Good  Akathisia:  No  Handed:  Right  AIMS (if indicated):    Assets:  Desire for Improvement  ADL's:  Intact  Cognition: WNL  Sleep:      10/27/20212:50 PM  This patient's first problem is that of major depression.  Her sleep is a problem that she takes multiple medications for this.  But depression being treated by Cymbalta 40 mg and desipramine.  The patient is considering trying to get a resume therapist.  Her second problem is related to sleep.  The patient is on high-dose Restoril 60 mg and also takes 2 Vistaril's at night.  Patient also takes 100 mg of trazodone.  Her sleep is only fairly good.  The patient also takes Lamictal for mood instability.  It is very clear that the patient has a characterological issue as well as significant affective symptomatology.  Patient is not suicidal.  She is becoming more isolated.  Patient is considering going on a trip to [place] to be with her son and daughter-in-law who recently moved from the [facility].  I think her family has gotten used  to her chronic medical problems.  I think there is objective evidence that she does have medical's illnesses.  I think she has multiple providers.  I will attempt to be as consistent as I possibly can encourage her to get into therapy and take my medicines as prescribed.  Today was a supportive psychotherapy session.

## 2020-09-12 ENCOUNTER — Encounter: Payer: PPO | Admitting: Physical Therapy

## 2020-09-12 ENCOUNTER — Other Ambulatory Visit: Payer: Self-pay | Admitting: Primary Care

## 2020-09-12 ENCOUNTER — Other Ambulatory Visit (HOSPITAL_COMMUNITY): Payer: Self-pay | Admitting: *Deleted

## 2020-09-12 DIAGNOSIS — F310 Bipolar disorder, current episode hypomanic: Secondary | ICD-10-CM

## 2020-09-12 DIAGNOSIS — L509 Urticaria, unspecified: Secondary | ICD-10-CM

## 2020-09-12 MED ORDER — DESIPRAMINE HCL 25 MG PO TABS: ORAL_TABLET | ORAL | 5 refills | Status: DC

## 2020-09-16 DIAGNOSIS — G4733 Obstructive sleep apnea (adult) (pediatric): Secondary | ICD-10-CM | POA: Diagnosis not present

## 2020-09-26 ENCOUNTER — Ambulatory Visit: Payer: PPO | Admitting: Cardiology

## 2020-09-27 DIAGNOSIS — G4733 Obstructive sleep apnea (adult) (pediatric): Secondary | ICD-10-CM | POA: Diagnosis not present

## 2020-10-02 DIAGNOSIS — G4733 Obstructive sleep apnea (adult) (pediatric): Secondary | ICD-10-CM | POA: Diagnosis not present

## 2020-10-03 DIAGNOSIS — M545 Low back pain, unspecified: Secondary | ICD-10-CM

## 2020-10-03 DIAGNOSIS — G8929 Other chronic pain: Secondary | ICD-10-CM

## 2020-10-03 DIAGNOSIS — M159 Polyosteoarthritis, unspecified: Secondary | ICD-10-CM

## 2020-10-03 DIAGNOSIS — M2550 Pain in unspecified joint: Secondary | ICD-10-CM

## 2020-10-03 MED ORDER — METHOCARBAMOL 500 MG PO TABS: 500.0000 mg | ORAL_TABLET | Freq: Every day | ORAL | 0 refills | Status: DC | PRN

## 2020-10-03 NOTE — Telephone Encounter (Signed)
Pharmacist with [facility] left v/m that methocarbamol 500 mg did not go thru on pts ins with notation that claim submission does not match prior auth info.  Pt wants med to go thru ins. Pharmacist left ins # [phone] to find out why not fill methocarbamol. [facility] [address] in [place] request cb.

## 2020-10-03 NOTE — Telephone Encounter (Signed)
Last office visit 08/28/2020 for DM.  Last refilled 08/22/2020 for #90 with no refills by R. Baity.

## 2020-10-04 NOTE — Telephone Encounter (Signed)
Can we find out what's going on?

## 2020-10-09 ENCOUNTER — Encounter: Payer: Self-pay | Admitting: Family Medicine

## 2020-10-09 ENCOUNTER — Other Ambulatory Visit: Payer: Self-pay

## 2020-10-09 ENCOUNTER — Ambulatory Visit (INDEPENDENT_AMBULATORY_CARE_PROVIDER_SITE_OTHER): Payer: PPO | Admitting: Family Medicine

## 2020-10-09 ENCOUNTER — Ambulatory Visit (INDEPENDENT_AMBULATORY_CARE_PROVIDER_SITE_OTHER)
Admission: RE | Admit: 2020-10-09 | Discharge: 2020-10-09 | Disposition: A | Discharge status: Home / Self Care | Payer: PPO | Source: Ambulatory Visit | Attending: Family Medicine | Admitting: Family Medicine

## 2020-10-09 VITALS — BP 120/76 | HR 109 | Temp 96.4°F | Ht 69.0 in | Wt 243.2 lb

## 2020-10-09 DIAGNOSIS — M1631 Unilateral osteoarthritis resulting from hip dysplasia, right hip: Secondary | ICD-10-CM

## 2020-10-09 DIAGNOSIS — K219 Gastro-esophageal reflux disease without esophagitis: Secondary | ICD-10-CM

## 2020-10-09 DIAGNOSIS — G35 Multiple sclerosis: Secondary | ICD-10-CM

## 2020-10-09 DIAGNOSIS — G35D Multiple sclerosis, unspecified: Secondary | ICD-10-CM

## 2020-10-09 DIAGNOSIS — M25551 Pain in right hip: Secondary | ICD-10-CM | POA: Diagnosis not present

## 2020-10-09 IMAGING — DX DG HIP (WITH OR WITHOUT PELVIS) 2-3V*R*
3 series · 3 of 3 positions shown · non-contrast
Comparison: [DATE]

CLINICAL DATA: Right hip pain.

EXAM:
DG HIP (WITH OR WITHOUT PELVIS) 2-3V RIGHT

[pelvis ap]
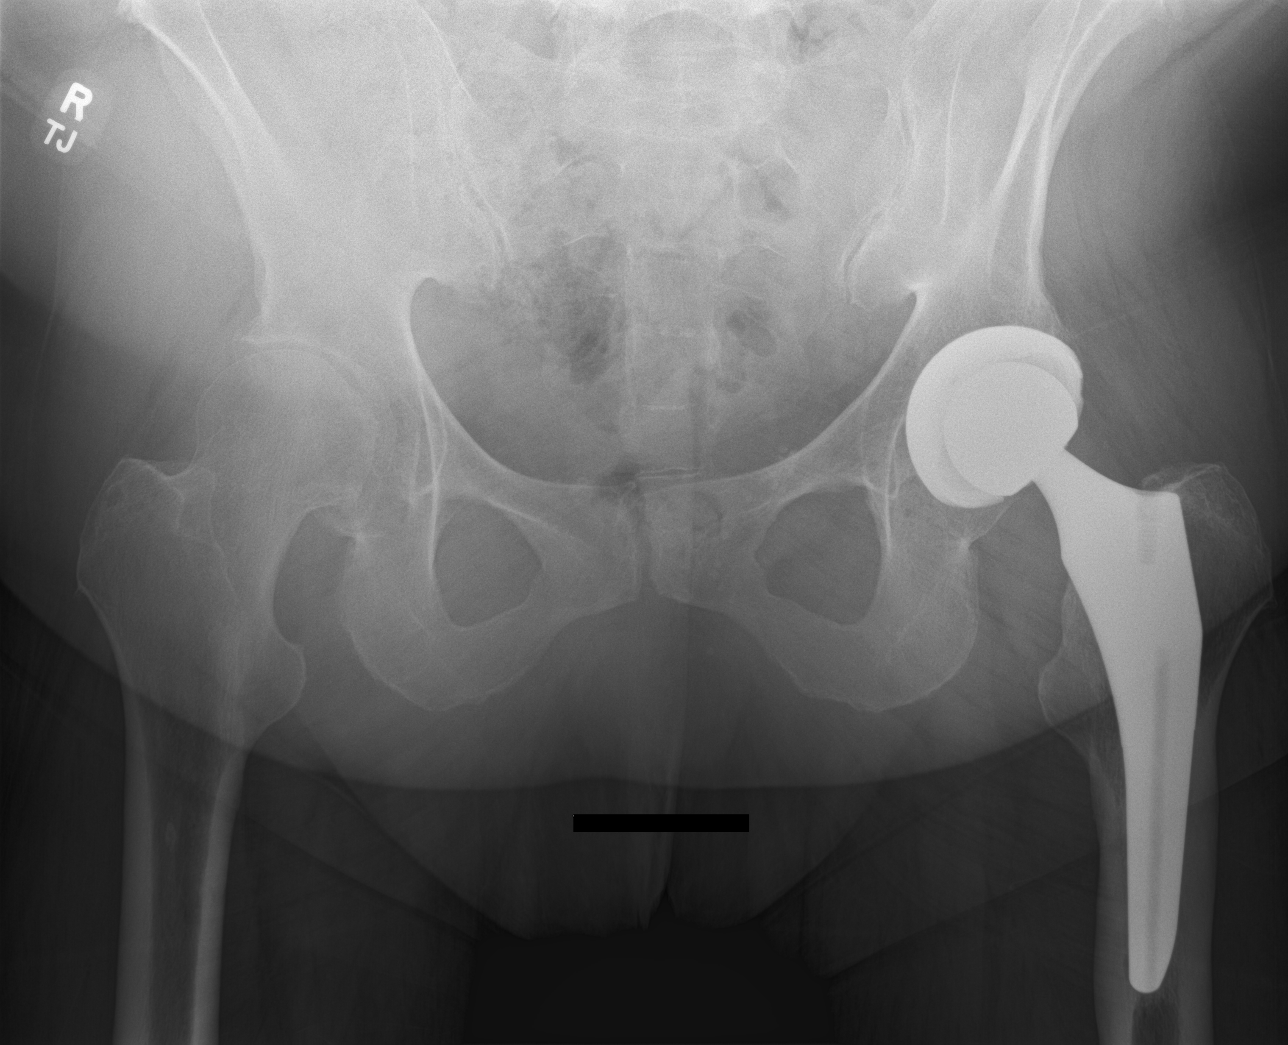

[hip ap]
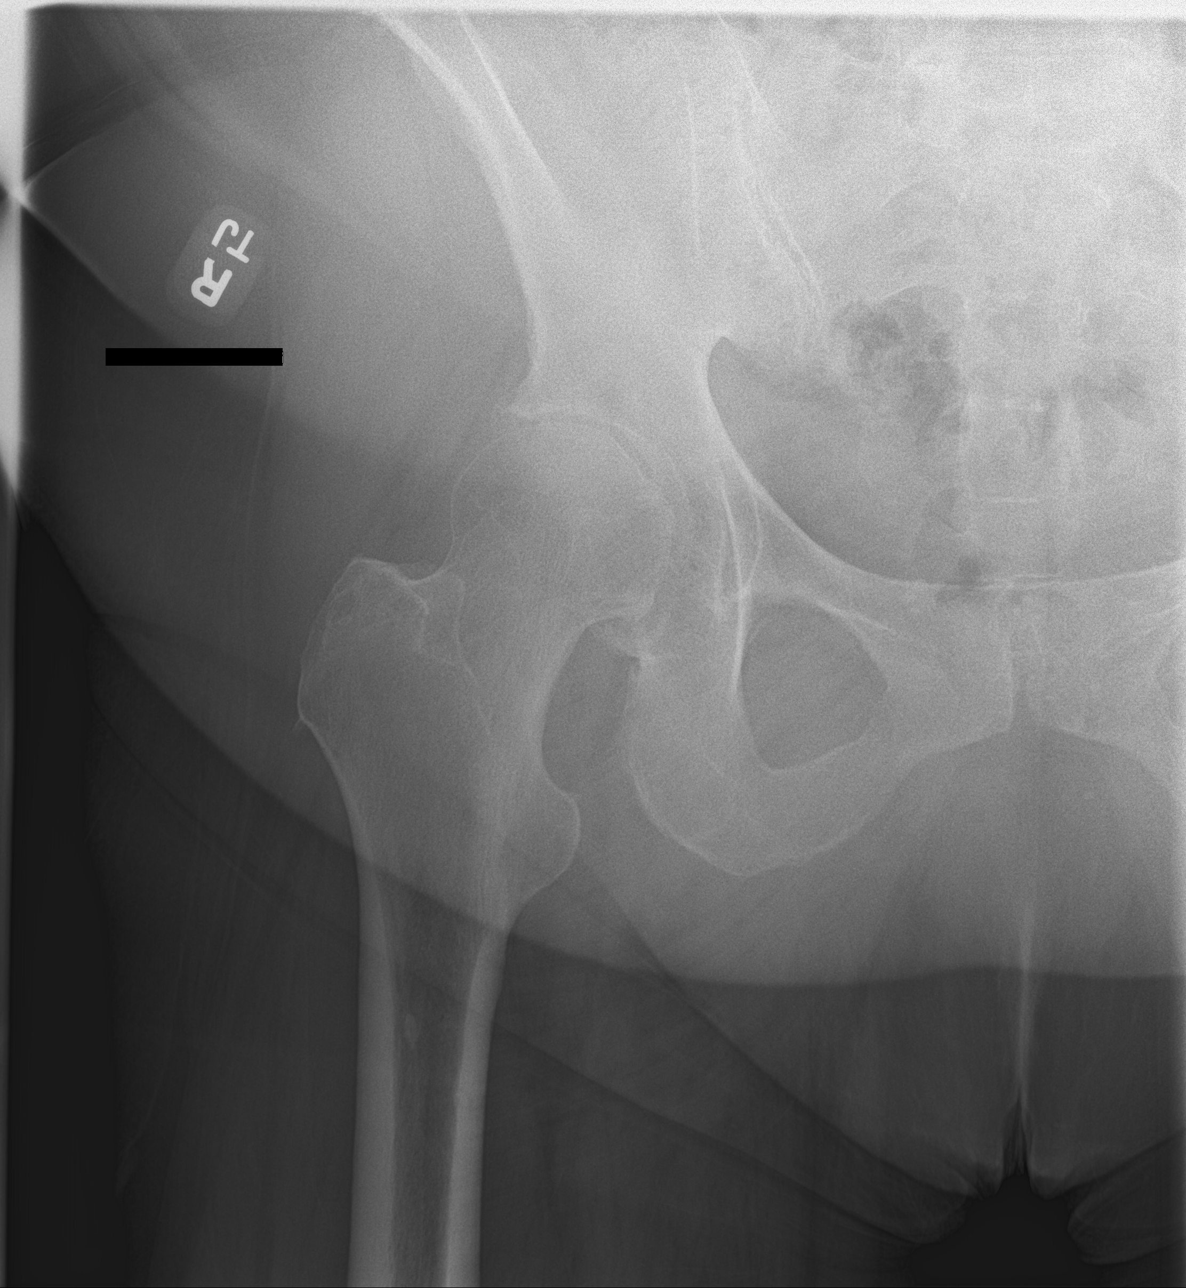

[hip lat]
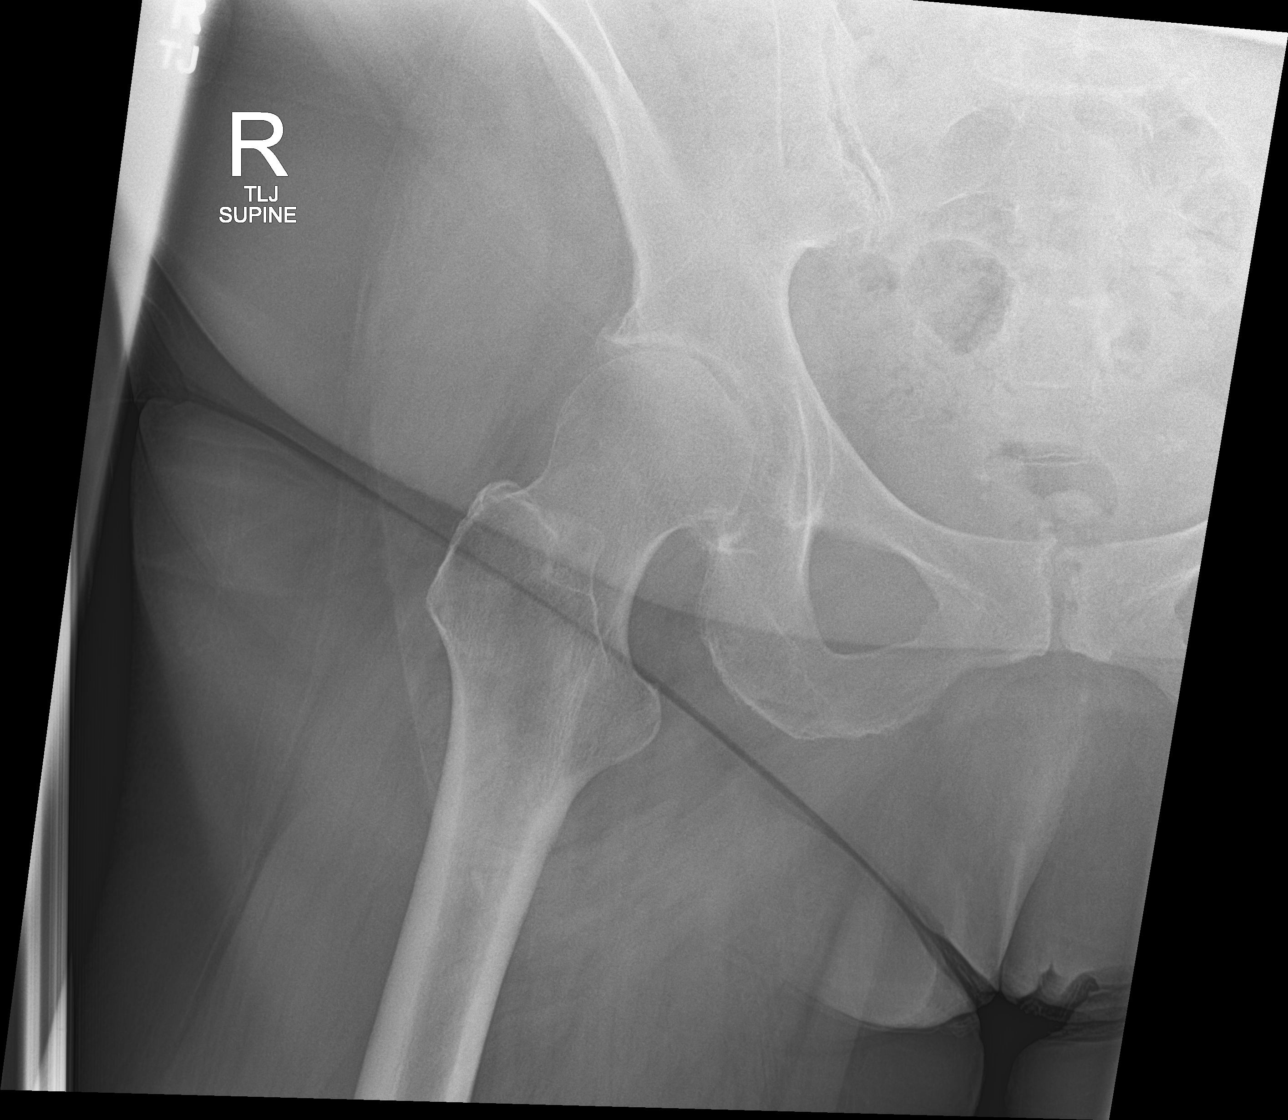

[3 of 3 positions shown; findings below may reference images not displayed]

FINDINGS: The patient is status post total hip arthroplasty on the left. There
are moderate degenerative changes of the right hip. There is no
acute displaced fracture or dislocation. Calcifications project over
the patient's pelvis that are favored to represent phleboliths.
IMPRESSION: 1. Moderate right hip osteoarthritis.
2. Status post total hip arthroplasty on the left.

## 2020-10-09 MED ORDER — PANTOPRAZOLE SODIUM 40 MG PO TBEC: 40.0000 mg | DELAYED_RELEASE_TABLET | Freq: Two times a day (BID) | ORAL | 1 refills | Status: DC

## 2020-10-09 NOTE — Progress Notes (Signed)
Emily Moon T. Gianny Sabino, MD, [place]  [facility] [place], [address]  Phone: [phone]  FAX: [place] - 59 y.o. female  MRN [id number]  Date of Birth: [birth year]  Date: 10/09/2020  PCP: [name], NP  Referral: [name], NP  Chief Complaint  Patient presents with  . Hip Pain    Right    This visit occurred during the SARS-CoV-2 public health emergency.  Safety protocols were in place, including screening questions prior to the visit, additional usage of staff PPE, and extensive cleaning of exam room while observing appropriate contact time as indicated for disinfecting solutions.   Subjective:   Emily Moon is a 59 y.o. very pleasant female patient with Body mass index is 35.92 kg/m. who presents with the following:  R hip dysplasia.  She is status post left-sided total hip arthroplasty, and she presents today with some severe right-sided hip pain, this is been intermittently ongoing and progressive over years.  She does have some known hip dysplasia.  She points to her deep groin as the place of her pain.  She does have some back pain, but this is not really the pain right now that is bothersome to her.  She has had pain with rotational movements, getting in and out of her clothes.  She also is having trouble getting in and out of the car.  Hurting with rotational movement and getting in and out of the car.   Severe pain back then in [place] -this was when she was helping her son move.  Some days are ok.  Has to use her walker.    Feet are always numb.  Additionally, the patient also has multiple sclerosis.  Review of Systems is noted in the HPI, as appropriate   Objective:   BP 120/76   Pulse (!) 109   Temp (!) 96.4 F (35.8 C) (Temporal)   Ht 5\' 9"  (1.753 m)   Wt 243 lb 4 oz (110.3 kg)   LMP 08/23/2014   SpO2 97%   BMI  35.92 kg/m    HIP EXAM: SIDE: Right ROM: Abduction, Flexion, Internal and External range of motion: She is limited to about 30 degrees in abduction.  She has significant difficulty with me even flexing the hip to 90 degrees.  She has approximately 20 degrees of total motion with internal and external range of motion.  She has significant pain with internal range of motion terminally. GTB: Minimally tender SLR: NEG Knees: No effusion FABER: NT REVERSE FABER: NT, neg Piriformis: NT at direct palpation Str: flexion: 5/5 abduction: 5/5 adduction: 5/5 Strength testing non-tender     Radiology: DG HIP UNILAT WITH PELVIS 2-3 VIEWS RIGHT  Result Date: 10/09/2020 CLINICAL DATA:  Right hip pain. EXAM: DG HIP (WITH OR WITHOUT PELVIS) 2-3V RIGHT COMPARISON:  November 22, 2017 FINDINGS: The patient is status post total hip arthroplasty on the left. There are moderate degenerative changes of the right hip. There is no acute displaced fracture or dislocation. Calcifications project over the patient's pelvis that are favored to represent phleboliths. IMPRESSION: 1. Moderate right hip osteoarthritis. 2. Status post total hip arthroplasty on the left. Electronically Signed   By: Constance Holster M.D.   On: 10/09/2020 22:53     Assessment and Plan:     ICD-10-CM   1. 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 Gastroesophageal reflux disease  K21.9 pantoprazole (PROTONIX) 40 MG tablet  3. MULTIPLE SCLEROSIS, PROGRESSIVE/RELAPSING  G35    She has progressive, advancing osteoarthritis of the right hip.  Globally, she is having some severe pain, and this is limiting her daily activities of living.  At this point, hip point is a dominant force in her life.  Weakness and difficulties with sensation secondary to multiple sclerosis is having an impact here as well.  Refill reflux medicine  She would  like to talk about her options with one of the total joint surgeons, and I think that that is entirely appropriate at this point.  I gave her some recommendations, and I placed a formal consult for her as well.  Meds ordered this encounter  Medications  . pantoprazole (PROTONIX) 40 MG tablet    Sig: Take 1 tablet (40 mg total) by mouth 2 (two) times daily.    Dispense:  180 tablet    Refill:  1    DX Code Needed  .   Medications Discontinued During This Encounter  Medication Reason  . pantoprazole (PROTONIX) 40 MG tablet Reorder   Orders Placed This Encounter  Procedures  . DG HIP UNILAT WITH PELVIS 2-3 VIEWS RIGHT  . Ambulatory referral to Orthopedic Surgery    Follow-up: No follow-ups on file.  Signed,  Maud Deed. Gloria Ricardo, MD   Outpatient Encounter Medications as of 10/09/2020  Medication Sig  . ASPERCREME LIDOCAINE EX Apply 1 application topically 4 (four) times daily as needed (for pain.).   Marland Kitchen aspirin 325 MG tablet Take 325 mg by mouth every 4 (four) hours as needed for headache.   Marland Kitchen azelastine (ASTELIN) 0.1 % nasal spray PLACE 2 SPRAYS INTO BOTH NOSTRILS 2 (TWO) TIMES DAILY  . BIOTIN PO Take 200 mg by mouth daily.   . budesonide-formoterol (SYMBICORT) 80-4.5 MCG/ACT inhaler Inhale 2 puffs into the lungs 2 (two) times daily.  . calcipotriene-betamethasone (TACLONEX) ointment Apply topically daily.  . cetirizine (ZYRTEC) 10 MG tablet Take 10 mg by mouth 2 (two) times daily.  . Cholecalciferol (VITAMIN D3) 10000 units capsule Take 30,000 Units by mouth daily.   . ciclopirox (LOPROX) 0.77 % cream Apply topically 2 (two) times daily.  . clobetasol (TEMOVATE) 0.05 % external solution Mix clobetasol solution with  CeraVe cream Use twice daily to affected areas.Avoid Face, groin and underarm  . Continuous Blood Gluc Sensor (FREESTYLE LIBRE 14 DAY SENSOR) MISC SMARTSIG:1 Each Topical Every 2 Weeks  . cyanocobalamin (,VITAMIN B-12,) 1000 MCG/ML injection Inject 1 mL (1,000 mcg  total) into the muscle every 3 (three) months.  . desipramine (NORPRAMIN) 25 MG tablet TAKE 1 TABLET BY MOUTH IN THE MORNING  . diclofenac sodium (VOLTAREN) 1 % GEL Apply 4 g topically 4 (four) times daily.  Marland Kitchen docusate sodium (COLACE) 100 MG capsule Take 1 capsule (100 mg total) by mouth 2 (two) times daily.  . Dulaglutide (TRULICITY) 4.85 IO/2.7OJ SOPN Inject 0.75 mg into the skin once a week. For diabetes.  . DULoxetine (CYMBALTA) 20 MG capsule Take 2 capsules (40 mg total) by mouth daily.  . Evolocumab (REPATHA SURECLICK) 500 MG/ML SOAJ Inject 1 Dose into the skin every 14 (fourteen) days.  . famotidine (PEPCID) 20 MG tablet TAKE 1 TABLET BY MOUTH EVERYDAY AT BEDTIME  . fluocinonide (LIDEX) 0.05 % external solution Apply 1 application topically 2 (two) times daily as needed. Apply to  scalp twice daily as need for itch  . hydrocortisone 2.5 % cream Apply BID to affected area in underarms and groin prn flares  . hydroquinone 4 % cream 1 APPLICATION TOPICALLY DAILY AS NEEDED FOR BLEMISHES.  . hydrOXYzine (VISTARIL) 50 MG capsule TAKE 1 CAPSLE BY MOUTH AT NOON, 1 AT 6 PM, AND 1 AS NEEDED  . lamoTRIgine (LAMICTAL) 150 MG tablet Take 2 tablets (300 mg total) by mouth at bedtime.  . levalbuterol (XOPENEX HFA) 45 MCG/ACT inhaler Inhale 1-2 puffs into the lungs every 6 (six) hours as needed for wheezing.  . levalbuterol (XOPENEX) 1.25 MG/3ML nebulizer solution USE 1 VIAL VIA NEBULIZER EVERY 4 HOURS AS NEEDED FOR WHEEZING OR SHORTNESS OF BREATH  . Lysine 1000 MG TABS Take 2,000-4,000 mg by mouth at bedtime. 2000 mg scheduled at bedtime and patient will take 4000 mg if she has outbreak  . Magnesium 500 MG TABS Take 1 tablet by mouth at bedtime.  . methocarbamol (ROBAXIN) 500 MG tablet Take 1-1.5 tablets (500-750 mg total) by mouth daily as needed for muscle spasms.  . metoprolol succinate (TOPROL-XL) 50 MG 24 hr tablet Take 1 tablet (50 mg total) by mouth daily. Take with or immediately following a meal.   . neomycin-bacitracin-polymyxin (NEOSPORIN) [phone] ointment Apply 1 application topically 4 (four) times daily as needed (for cut/scrapes.).  Marland Kitchen Olopatadine HCl 0.2 % SOLN 1 drop once daily  . pantoprazole (PROTONIX) 40 MG tablet Take 1 tablet (40 mg total) by mouth 2 (two) times daily.  . phenazopyridine (AZO-TABS) 95 MG tablet Take 95 mg by mouth 3 (three) times daily as needed for pain.  . polyethylene glycol powder (GLYCOLAX/MIRALAX) powder MIX 17 GRAMS (1 CAPFUL) WITH 4-8 OZ OF LIQUID AND TAKE BY MOUTH TWICE DAILY AS NEEDED  . temazepam (RESTORIL) 30 MG capsule TAKE ONE TO TWO CAPSULES BY MOUTH NIGHTLY AT BEDTIME  . traZODone (DESYREL) 50 MG tablet Take 2 tablets (100 mg total) by mouth at bedtime.  . valACYclovir (VALTREX) 1000 MG tablet Take 1 tablet (1,000 mg total) by mouth 2 (two) times daily.  . [DISCONTINUED] pantoprazole (PROTONIX) 40 MG tablet TAKE 1 TABLET BY MOUTH EVERY DAY   No facility-administered encounter medications on file as of 10/09/2020.

## 2020-10-16 DIAGNOSIS — G4733 Obstructive sleep apnea (adult) (pediatric): Secondary | ICD-10-CM | POA: Diagnosis not present

## 2020-10-18 ENCOUNTER — Other Ambulatory Visit: Payer: Self-pay | Admitting: Primary Care

## 2020-10-18 DIAGNOSIS — E119 Type 2 diabetes mellitus without complications: Secondary | ICD-10-CM

## 2020-10-23 ENCOUNTER — Other Ambulatory Visit: Payer: Self-pay | Admitting: Cardiology

## 2020-10-23 MED ORDER — METOPROLOL SUCCINATE ER 50 MG PO TB24: 50.0000 mg | ORAL_TABLET | Freq: Every day | ORAL | 1 refills | Status: DC

## 2020-10-23 NOTE — Telephone Encounter (Signed)
Rx request sent to pharmacy.  

## 2020-10-23 NOTE — Telephone Encounter (Signed)
*  STAT* If patient is at the pharmacy, call can be transferred to refill team.   1. Which medications need to be refilled? (please list name of each medication and dose if known)   metoprolol succinate (TOPROL-XL) 50 MG 24 hr tablet    2. Which pharmacy/location (including street and city if local pharmacy) is medication to be sent to? [facility] [address], [place]  3. Do they need a 30 day or 90 day supply? 90 day

## 2020-10-27 DIAGNOSIS — G4733 Obstructive sleep apnea (adult) (pediatric): Secondary | ICD-10-CM | POA: Diagnosis not present

## 2020-10-28 ENCOUNTER — Other Ambulatory Visit: Payer: Self-pay

## 2020-10-28 ENCOUNTER — Telehealth (INDEPENDENT_AMBULATORY_CARE_PROVIDER_SITE_OTHER): Payer: PPO | Admitting: Primary Care

## 2020-10-28 VITALS — HR 89 | Ht 69.0 in | Wt 241.0 lb

## 2020-10-28 DIAGNOSIS — J019 Acute sinusitis, unspecified: Secondary | ICD-10-CM | POA: Diagnosis not present

## 2020-10-28 MED ORDER — AMOXICILLIN-POT CLAVULANATE 875-125 MG PO TABS: 1.0000 | ORAL_TABLET | Freq: Two times a day (BID) | ORAL | 0 refills | Status: DC

## 2020-10-28 NOTE — Progress Notes (Signed)
Subjective:    Patient ID: Emily Moon, female    DOB: 1960/12/24, 59 y.o.   MRN: [id number]  HPI     Emily Moon - 59 y.o. female  MRN [id number]  Date of Birth: 08/09/1961  PCP: [name], NP  This service was provided via telemedicine. Phone Visit performed on 10/28/2020    Rationale for phone visit along with limitations reviewed. I discussed the limitations, risks, security and privacy concerns of performing a phone visit and the availability of in person appointments. I also discussed with the patient that there may be a patient responsible charge related to this service. Patient consented to telephone encounter.    Location of patient: Home Location of provider: Office at [employer] [facility] [employer] Participants: Patient and myself Name of referring provider: N/A   Names of persons and role in encounter: Provider: [name], NP  Patient: Emily Moon  Other: N/A   Time on call: 11 min - 29 sec   Subjective: Chief Complaint  Patient presents with  . Sinus Problem    Facial pain with ear pain x 1 week. Denies any fever that she is aware of. Has had cough off and on.      HPI:  Emily Moon is a 59 year old female with a significant medical history including OSA, asthma, GERD, type 2 diabetes, multiple sclerosis, chronic pain syndrome, renal stones who presents today with a chief complaint of nasal congestion.  She also reports ear fullness, left sided facial pressure, left upper dental pain. She's been using her usual medications including Zyrtec and Astelin daily. Her nasal cavity mucous is clear. Symptoms began about two weeks ago, no improvement in symptoms now.   Objective/Observations:   Alert and oriented. No cough, No distress. Speaking in complete sentences.  No physical exam or vital signs collected unless specifically identified below.   Pulse 89   Ht 5\' 9"  (1.753 m)   Wt 241 lb (109.3 kg)   LMP 08/23/2014   SpO2 98%    BMI 35.59 kg/m    Respiratory status: speaks in complete sentences without evident shortness of breath.   Assessment/Plan:  Two week history of sinus pressure, now with upper left dental pain, ear fullness. No improvement with conservative treatment.  Given duration of symptoms coupled with ongoing symptoms, will treat with Augmentin course for presumed bacterial involvement.   Continue Zyrtec and Astelin.  No problem-specific Assessment & Plan notes found for this encounter.   I discussed the assessment and treatment plan with the patient. The patient was provided an opportunity to ask questions and all were answered. The patient agreed with the plan and demonstrated an understanding of the instructions.  Lab Orders  No laboratory test(s) ordered today    No orders of the defined types were placed in this encounter.   The patient was advised to call back or seek an in-person evaluation if the symptoms worsen or if the condition fails to improve as anticipated.  [name], NP    Review of Systems  HENT: Positive for congestion, sinus pressure and sinus pain.   Respiratory: Negative for cough.   Allergic/Immunologic: Positive for environmental allergies.       Past Medical History:  Diagnosis Date  . Arthritis    osteo  . Asthma   . Bipolar disorder ([place]) 05/21/14  . Cataracts, bilateral   . DDD (degenerative disc disease), cervical    also back  . Depression   .  Dizziness    Positional  . Edema    feet/legs  . Fibromyalgia syndrome   . Fungal infection    Finger nails  . GERD (gastroesophageal reflux disease)   . Gout   . Headache    seasonal allergies  . Heart palpitations   . 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 Hypercholesterolemia   . Multiple sclerosis ([facility])    weakness  . Nephrolithiasis    kidney stones  . Osteoporosis    osteoarthritis  . Pneumonia   . PONV (postoperative nausea and vomiting)    no problem after cataract  surgery  . Psoriasis   . Renal stone   . Shortness of breath dyspnea    wheezing  . Sleep apnea 2012   sleep study / slight, no interventions  . Urinary frequency      Social History   Socioeconomic History  . Marital status: Divorced    Spouse name: Not on file  . Number of children: 1  . Years of education: Not on file  . Highest education level: Not on file  Occupational History  . 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 Smoking status: Former Smoker    Years: 25.00    Types: Cigarettes    Quit date: 11/16/2012    Years since quitting: 7.9  . Smokeless tobacco: Never Used  . Tobacco comment: occasional use  Vaping Use  . Vaping Use: Former  Substance and Sexual Activity  . Alcohol use: No    Alcohol/week: 0.0 standard drinks  . Drug use: No    Types: Methylphenidate  . Sexual activity: Never  Other Topics Concern  . Not on file  Social History Narrative  . Not on file   Social Determinants of Health   Financial Resource Strain: Low Risk   . Difficulty of Paying Living Expenses: Not hard at all  Food Insecurity: No Food Insecurity  . Worried About Charity fundraiser in the Last Year: Never true  . Ran Out of Food in the Last Year: Never true  Transportation Needs: No Transportation Needs  . Lack of Transportation (Medical): No  . Lack of Transportation (Non-Medical): No  Physical Activity: Inactive  . Days of Exercise per Week: 0 days  . Minutes of Exercise per Session: 0 min  Stress: Stress Concern Present  . Feeling of Stress : Very much  Social Connections: Not on file  Intimate Partner Violence: Not At Risk  . Fear of Current or Ex-Partner: No  . Emotionally Abused: No  . Physically Abused: No  . Sexually Abused: No    Past Surgical History:  Procedure Laterality Date  . CATARACT EXTRACTION W/PHACO Left 05/21/2015   Procedure: CATARACT EXTRACTION PHACO AND INTRAOCULAR LENS PLACEMENT (IOC);  Surgeon:  Birder Robson, MD;  Location: [facility];  Service: Ophthalmology;  Laterality: Left;  [place] 00:35 AP% 22.9 CDE 8.11 fluid pack lot #[id number]  . CATARACT EXTRACTION W/PHACO Right 06/04/2015   Procedure: CATARACT EXTRACTION PHACO AND INTRAOCULAR LENS PLACEMENT (IOC);  Surgeon: Birder Robson, MD;  Location: [facility];  Service: Ophthalmology;  Laterality: Right;  US:00:48 AP%: 10.5 CDE:5.08 Fluid lot #[id number]  . CYSTOSCOPY/URETEROSCOPY/HOLMIUM LASER/STENT PLACEMENT Bilateral 09/22/2016   Procedure: CYSTOSCOPY/URETEROSCOPY/HOLMIUM LASER/STENT PLACEMENT;  Surgeon: Hollice Espy, MD;  Location: [facility];  Service: Urology;  Laterality: Bilateral;  . EYE SURGERY  2015   tissue biopsy  . FOOT SURGERY  2015  . JOINT REPLACEMENT Left 2013  hip replacement  . LITHOTRIPSY    . PTOSIS REPAIR Bilateral 02/18/2016   Procedure: BILATERAL PTOSIS REPAIR UPPER EYELIDS;  Surgeon: Karle Starch, MD;  Location: [facility];  Service: Ophthalmology;  Laterality: Bilateral;  LEAVE PT EARLY AM  . thumb surgery Right   . TONSILLECTOMY  1973    Family History  Problem Relation Age of Onset  . Cancer Father        Abdomen with mastasis  . Cancer Mother   . Heart disease Mother   . Kidney disease Neg Hx   . Bladder Cancer Neg Hx   . Prostate cancer Neg Hx   . Kidney cancer Neg Hx     Allergies  Allergen Reactions  . Albuterol Shortness Of Breath and Other (See Comments)    Makes pt feel jittery/ tacycardic  . Crestor [Rosuvastatin] Other (See Comments)    Joint pain, muscle pain, and hair loss  . Halcion [Triazolam] Other (See Comments)    Dizziness,headaches,bladder problems  . Levaquin [Levofloxacin In D5w] Diarrhea and Itching    Shoulder pain  . Naproxen Sodium Swelling    Patient tolerates in small doses  . Tylenol [Acetaminophen] Swelling    Patient tolerates in small doses  . Cefaclor Other (See Comments)    Doesn't remember---unsure if actually allergic   . Diclofenac Sodium  Other (See Comments)    "made very sick"  . Sulfa Antibiotics Itching    Unsure of reaction possibly itching Unsure of reaction possibly itching  . Tramadol Itching and Nausea And Vomiting  . Aripiprazole Other (See Comments)    Muscle tension/cramping  . Diclofenac Sodium Rash    "made very sick"  . Ibuprofen Swelling    Patient tolerates in small doses    Current Outpatient Medications on File Prior to Visit  Medication Sig Dispense Refill  . ASPERCREME LIDOCAINE EX Apply 1 application topically 4 (four) times daily as needed (for pain.).     Marland Kitchen aspirin 325 MG tablet Take 325 mg by mouth every 4 (four) hours as needed for headache.     Marland Kitchen azelastine (ASTELIN) 0.1 % nasal spray PLACE 2 SPRAYS INTO BOTH NOSTRILS 2 (TWO) TIMES DAILY 30 mL 1  . BIOTIN PO Take 200 mg by mouth daily.     . budesonide-formoterol (SYMBICORT) 80-4.5 MCG/ACT inhaler Inhale 2 puffs into the lungs 2 (two) times daily. 1 each 6  . calcipotriene-betamethasone (TACLONEX) ointment Apply topically daily. 60 g 0  . cetirizine (ZYRTEC) 10 MG tablet Take 10 mg by mouth 2 (two) times daily.    . Cholecalciferol (VITAMIN D3) 10000 units capsule Take 30,000 Units by mouth daily.     . ciclopirox (LOPROX) 0.77 % cream Apply topically 2 (two) times daily. 30 g 0  . clobetasol (TEMOVATE) 0.05 % external solution Mix clobetasol solution with  CeraVe cream Use twice daily to affected areas.Avoid Face, groin and underarm 50 mL 0  . Continuous Blood Gluc Sensor (FREESTYLE LIBRE 14 DAY SENSOR) MISC SMARTSIG:1 Each Topical Every 2 Weeks    . cyanocobalamin (,VITAMIN B-12,) 1000 MCG/ML injection Inject 1 mL (1,000 mcg total) into the muscle every 3 (three) months. 1 mL 0  . desipramine (NORPRAMIN) 25 MG tablet TAKE 1 TABLET BY MOUTH IN THE MORNING 30 tablet 5  . diclofenac sodium (VOLTAREN) 1 % GEL Apply 4 g topically 4 (four) times daily. 500 g 5  . docusate sodium (COLACE) 100 MG capsule Take 1 capsule (100 mg total) by mouth  2 (two)  times daily. 60 capsule 0  . Dulaglutide (TRULICITY) 2.54 YH/0.6CB SOPN Inject 0.75 mg into the skin once a week. For diabetes. 6 mL 0  . DULoxetine (CYMBALTA) 20 MG capsule Take 2 capsules (40 mg total) by mouth daily. 180 capsule 2  . Evolocumab (REPATHA SURECLICK) 762 MG/ML SOAJ Inject 1 Dose into the skin every 14 (fourteen) days. 2 pen 11  . famotidine (PEPCID) 20 MG tablet TAKE 1 TABLET BY MOUTH EVERYDAY AT BEDTIME 90 tablet 3  . fluocinonide (LIDEX) 0.05 % external solution Apply 1 application topically 2 (two) times daily as needed. Apply to scalp twice daily as need for itch 180 mL 3  . hydrocortisone 2.5 % cream Apply BID to affected area in underarms and groin prn flares    . hydroquinone 4 % cream 1 APPLICATION TOPICALLY DAILY AS NEEDED FOR BLEMISHES.  3  . hydrOXYzine (VISTARIL) 50 MG capsule TAKE 1 CAPSLE BY MOUTH AT NOON, 1 AT 6 PM, AND 1 AS NEEDED 270 capsule 2  . lamoTRIgine (LAMICTAL) 150 MG tablet Take 2 tablets (300 mg total) by mouth at bedtime. 180 tablet 1  . levalbuterol (XOPENEX HFA) 45 MCG/ACT inhaler Inhale 1-2 puffs into the lungs every 6 (six) hours as needed for wheezing. 15 g 0  . levalbuterol (XOPENEX) 1.25 MG/3ML nebulizer solution USE 1 VIAL VIA NEBULIZER EVERY 4 HOURS AS NEEDED FOR WHEEZING OR SHORTNESS OF BREATH 270 mL 1  . Lysine 1000 MG TABS Take 2,000-4,000 mg by mouth at bedtime. 2000 mg scheduled at bedtime and patient will take 4000 mg if she has outbreak    . Magnesium 500 MG TABS Take 1 tablet by mouth at bedtime.    . methocarbamol (ROBAXIN) 500 MG tablet Take 1-1.5 tablets (500-750 mg total) by mouth daily as needed for muscle spasms. 135 tablet 0  . metoprolol succinate (TOPROL-XL) 50 MG 24 hr tablet Take 1 tablet (50 mg total) by mouth daily. Take with or immediately following a meal. 90 tablet 1  . neomycin-bacitracin-polymyxin (NEOSPORIN) [phone] ointment Apply 1 application topically 4 (four) times daily as needed (for cut/scrapes.).    Marland Kitchen  Olopatadine HCl 0.2 % SOLN 1 drop once daily    . pantoprazole (PROTONIX) 40 MG tablet Take 1 tablet (40 mg total) by mouth 2 (two) times daily. 180 tablet 1  . phenazopyridine (PYRIDIUM) 95 MG tablet Take 95 mg by mouth 3 (three) times daily as needed for pain.    . polyethylene glycol powder (GLYCOLAX/MIRALAX) powder MIX 17 GRAMS (1 CAPFUL) WITH 4-8 OZ OF LIQUID AND TAKE BY MOUTH TWICE DAILY AS NEEDED 527 g 0  . temazepam (RESTORIL) 30 MG capsule TAKE ONE TO TWO CAPSULES BY MOUTH NIGHTLY AT BEDTIME 60 capsule 4  . traZODone (DESYREL) 50 MG tablet Take 2 tablets (100 mg total) by mouth at bedtime. 60 tablet 7  . valACYclovir (VALTREX) 1000 MG tablet Take 1 tablet (1,000 mg total) by mouth 2 (two) times daily. 10 tablet 0  . zinc gluconate 50 MG tablet Take 50 mg by mouth daily.     No current facility-administered medications on file prior to visit.    Pulse 89   Ht 5\' 9"  (1.753 m)   Wt 241 lb (109.3 kg)   LMP 08/23/2014   SpO2 98%   BMI 35.59 kg/m    Objective:   Physical Exam Constitutional:      General: She is not in acute distress. Pulmonary:     Effort:  Pulmonary effort is normal.     Comments: No cough Neurological:     Mental Status: She is alert.  Psychiatric:        Mood and Affect: Mood normal.            Assessment & Plan:

## 2020-10-28 NOTE — Telephone Encounter (Signed)
See if Dr. Lorelei Pont can complete as he knows more about her in this regard.

## 2020-10-28 NOTE — Patient Instructions (Signed)
Start Augmentin antibiotics for the infection Take 1 tablet by mouth twice daily for 7 days.  Continue taking Zyrtec and using your nasal spray.  It was a pleasure to see you today!

## 2020-10-29 ENCOUNTER — Other Ambulatory Visit: Payer: Self-pay | Admitting: Primary Care

## 2020-10-29 DIAGNOSIS — R7303 Prediabetes: Secondary | ICD-10-CM

## 2020-10-29 NOTE — Telephone Encounter (Signed)
If she brings it to me, I am happy to help.

## 2020-10-31 ENCOUNTER — Ambulatory Visit (INDEPENDENT_AMBULATORY_CARE_PROVIDER_SITE_OTHER): Payer: PPO | Admitting: Cardiology

## 2020-10-31 ENCOUNTER — Other Ambulatory Visit: Payer: Self-pay

## 2020-10-31 ENCOUNTER — Encounter: Payer: Self-pay | Admitting: Cardiology

## 2020-10-31 VITALS — BP 110/70 | HR 97 | Ht 69.0 in | Wt 251.1 lb

## 2020-10-31 DIAGNOSIS — R000 Tachycardia, unspecified: Secondary | ICD-10-CM | POA: Diagnosis not present

## 2020-10-31 DIAGNOSIS — Z6835 Body mass index (BMI) 35.0-35.9, adult: Secondary | ICD-10-CM | POA: Diagnosis not present

## 2020-10-31 DIAGNOSIS — E7800 Pure hypercholesterolemia, unspecified: Secondary | ICD-10-CM

## 2020-10-31 MED ORDER — FENOFIBRATE 145 MG PO TABS: 145.0000 mg | ORAL_TABLET | Freq: Every day | ORAL | 5 refills | Status: DC

## 2020-10-31 MED ORDER — METOPROLOL SUCCINATE ER 50 MG PO TB24: 50.0000 mg | ORAL_TABLET | Freq: Every day | ORAL | 5 refills | Status: DC

## 2020-10-31 NOTE — Progress Notes (Signed)
Cardiology Office Note:    Date:  10/31/2020   ID:  Emily Moon, DOB 29-Jul-1961, MRN [id number]  PCP:  [name], NP  [facility] Cardiologist:  [name] Sable, MD  [place] Electrophysiologist:  None   Referring MD: [name], NP   Chief Complaint  Patient presents with  . Follow-up    3 month. Patient c/o tachycardia at times and shortness of breath.     History of Present Illness:    Emily Moon is a 59 y.o. female with a hx of asthma, bipolar, OSA, diabetes, hyperlipidemia, GERD, former smoker x15 years who presents for follow-up.  Last seen due to tachycardia.  Diagnosed with inappropriate sinus tachycardia.  Toprol-XL was started, subsequently increased to 50 mg daily.  Patient states her symptoms are much improved since starting Toprol-XL.  Heart rates have been better, baseline in the 80s to 90s beats per minute.  Symptoms of palpitations also much improved.  She is planning on getting right hip replaced in January due to hip dysplasia.  Also trying to lose weight, endorses not eating well or healthy.  Prior notes.  She has obstructive pulmonary disease and is seeing pulmonary medicine for this.  She used to follow-up in [place] at the hyperlipidemia clinic where Repatha was started.  She has allergies to statins.  Established care in [place] for better commute.  Past Medical History:  Diagnosis Date  . Arthritis    osteo  . Asthma   . Bipolar disorder ([place]) 05/21/14  . Cataracts, bilateral   . DDD (degenerative disc disease), cervical    also back  . Depression   . Dizziness    Positional  . Edema    feet/legs  . Fibromyalgia syndrome   . Fungal infection    Finger nails  . GERD (gastroesophageal reflux disease)   . Gout   . Headache    seasonal allergies  . Heart palpitations   . Hip dysplasia, congenital 09/15/2013  . Hypercholesterolemia   . Multiple sclerosis (HCC)    weakness  . Nephrolithiasis     kidney stones  . Osteoporosis    osteoarthritis  . Pneumonia   . PONV (postoperative nausea and vomiting)    no problem after cataract surgery  . Psoriasis   . Renal stone   . Shortness of breath dyspnea    wheezing  . Sleep apnea 2012   sleep study / slight, no interventions  . Urinary frequency     Past Surgical History:  Procedure Laterality Date  . CATARACT EXTRACTION W/PHACO Left 05/21/2015   Procedure: CATARACT EXTRACTION PHACO AND INTRAOCULAR LENS PLACEMENT (IOC);  Surgeon: Birder Robson, MD;  Location: [facility] ORS;  Service: Ophthalmology;  Laterality: Left;  [place] 00:35 AP% 22.9 CDE 8.11 fluid pack lot #[id number]  . CATARACT EXTRACTION W/PHACO Right 06/04/2015   Procedure: CATARACT EXTRACTION PHACO AND INTRAOCULAR LENS PLACEMENT (IOC);  Surgeon: Birder Robson, MD;  Location: [facility] OR[facility];  Service: Ophthalmology;  Laterality: Right;  US:00:48 AP%: 10.5 CDE:5.08 Fluid lot #[id number]  . CYSTOSCOPY/URETEROSCOPY/HOLMIUM LASER/STENT PLACEMENT Bilateral 09/22/2016   Procedure: CYSTOSCOPY/URETEROSCOPY/HOLMIUM LASER/STENT PLACEMENT;  Surgeon: Hollice Espy, MD;  Location: [facility] ORS;  Service: Urology;  Laterality: Bilateral;  . EYE SURGERY  2015   tissue biopsy  . FOOT SURGERY  2015  . JOINT REPLACEMENT Left 2013   hip replacement  . LITHOTRIPSY    . PTOSIS REPAIR Bilateral 02/18/2016   Procedure: BILATERAL PTOSIS REPAIR UPPER EYELIDS;  Surgeon: Karle Starch, MD;  Location: [place];  Service: Ophthalmology;  Laterality: Bilateral;  LEAVE PT EARLY AM  . thumb surgery Right   . TONSILLECTOMY  1973    Current Medications: Current Meds  Medication Sig  . amoxicillin-clavulanate (AUGMENTIN) 875-125 MG tablet Take 1 tablet by mouth 2 (two) times daily.  . ASPERCREME LIDOCAINE EX Apply 1 application topically 4 (four) times daily as needed (for pain.).   Marland Kitchen ASPIRIN 81 PO Take by mouth daily.  Marland Kitchen azelastine (ASTELIN) 0.1 % nasal spray PLACE 2 SPRAYS INTO BOTH NOSTRILS 2 (TWO)  TIMES DAILY  . BIOTIN PO Take 200 mg by mouth daily.   . budesonide-formoterol (SYMBICORT) 80-4.5 MCG/ACT inhaler Inhale 2 puffs into the lungs 2 (two) times daily.  . calcipotriene-betamethasone (TACLONEX) ointment Apply topically daily.  . cetirizine (ZYRTEC) 10 MG tablet Take 10 mg by mouth 2 (two) times daily.  . Cholecalciferol (VITAMIN D3) 10000 units capsule Take 30,000 Units by mouth daily.   . ciclopirox (LOPROX) 0.77 % cream Apply topically 2 (two) times daily.  . clobetasol (TEMOVATE) 0.05 % external solution Mix clobetasol solution with  CeraVe cream Use twice daily to affected areas.Avoid Face, groin and underarm  . Continuous Blood Gluc Sensor (FREESTYLE LIBRE 14 DAY SENSOR) MISC PLACE INTO SKIN FOR CONTINUOUS CHECK OF BLOOD SUGAR DAILY  . cyanocobalamin (,VITAMIN B-12,) 1000 MCG/ML injection Inject 1 mL (1,000 mcg total) into the muscle every 3 (three) months.  . desipramine (NORPRAMIN) 25 MG tablet TAKE 1 TABLET BY MOUTH IN THE MORNING  . diclofenac sodium (VOLTAREN) 1 % GEL Apply 4 g topically 4 (four) times daily.  Marland Kitchen docusate sodium (COLACE) 100 MG capsule Take 1 capsule (100 mg total) by mouth 2 (two) times daily.  . Dulaglutide (TRULICITY) 0.45 WU/9.8JX SOPN Inject 0.75 mg into the skin once a week. For diabetes.  . DULoxetine (CYMBALTA) 20 MG capsule Take 2 capsules (40 mg total) by mouth daily.  . Evolocumab (REPATHA SURECLICK) 914 MG/ML SOAJ Inject 1 Dose into the skin every 14 (fourteen) days.  . famotidine (PEPCID) 20 MG tablet TAKE 1 TABLET BY MOUTH EVERYDAY AT BEDTIME  . fluocinonide (LIDEX) 0.05 % external solution Apply 1 application topically 2 (two) times daily as needed. Apply to scalp twice daily as need for itch  . hydrocortisone 2.5 % cream Apply BID to affected area in underarms and groin prn flares  . hydroquinone 4 % cream 1 APPLICATION TOPICALLY DAILY AS NEEDED FOR BLEMISHES.  . hydrOXYzine (VISTARIL) 50 MG capsule TAKE 1 CAPSLE BY MOUTH AT NOON, 1 AT 6 PM,  AND 1 AS NEEDED  . lamoTRIgine (LAMICTAL) 150 MG tablet Take 2 tablets (300 mg total) by mouth at bedtime.  . levalbuterol (XOPENEX HFA) 45 MCG/ACT inhaler Inhale 1-2 puffs into the lungs every 6 (six) hours as needed for wheezing.  . levalbuterol (XOPENEX) 1.25 MG/3ML nebulizer solution USE 1 VIAL VIA NEBULIZER EVERY 4 HOURS AS NEEDED FOR WHEEZING OR SHORTNESS OF BREATH  . Lysine 1000 MG TABS Take 2,000-4,000 mg by mouth at bedtime. 2000 mg scheduled at bedtime and patient will take 4000 mg if she has outbreak  . Magnesium 500 MG TABS Take 1 tablet by mouth at bedtime.  . methocarbamol (ROBAXIN) 500 MG tablet Take 1-1.5 tablets (500-750 mg total) by mouth daily as needed for muscle spasms.  Marland Kitchen neomycin-bacitracin-polymyxin (NEOSPORIN) [phone] ointment Apply 1 application topically 4 (four) times daily as needed (for cut/scrapes.).  Marland Kitchen Olopatadine HCl 0.2 % SOLN 1 drop once daily  .  pantoprazole (PROTONIX) 40 MG tablet Take 1 tablet (40 mg total) by mouth 2 (two) times daily.  . phenazopyridine (PYRIDIUM) 95 MG tablet Take 95 mg by mouth 3 (three) times daily as needed for pain.  . polyethylene glycol powder (GLYCOLAX/MIRALAX) powder MIX 17 GRAMS (1 CAPFUL) WITH 4-8 OZ OF LIQUID AND TAKE BY MOUTH TWICE DAILY AS NEEDED  . temazepam (RESTORIL) 30 MG capsule TAKE ONE TO TWO CAPSULES BY MOUTH NIGHTLY AT BEDTIME  . traZODone (DESYREL) 50 MG tablet Take 2 tablets (100 mg total) by mouth at bedtime.  . valACYclovir (VALTREX) 1000 MG tablet Take 1 tablet (1,000 mg total) by mouth 2 (two) times daily.  Marland Kitchen zinc gluconate 50 MG tablet Take 50 mg by mouth daily.  . [DISCONTINUED] metoprolol succinate (TOPROL-XL) 50 MG 24 hr tablet Take 1 tablet (50 mg total) by mouth daily. Take with or immediately following a meal.     Allergies:   Albuterol, Crestor [rosuvastatin], Halcion [triazolam], Levaquin [levofloxacin in d5w], Naproxen sodium, Tylenol [acetaminophen], Cefaclor, Diclofenac sodium, Sulfa antibiotics,  Tramadol, Aripiprazole, Diclofenac sodium, and Ibuprofen   Social History   Socioeconomic History  . Marital status: Divorced    Spouse name: Not on file  . Number of children: 1  . Years of education: Not on file  . Highest education level: Not on file  Occupational History  . 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 Smoking status: Former Smoker    Years: 25.00    Types: Cigarettes    Quit date: 11/16/2012    Years since quitting: 7.9  . Smokeless tobacco: Never Used  . Tobacco comment: occasional use  Vaping Use  . Vaping Use: Former  Substance and Sexual Activity  . Alcohol use: No    Alcohol/week: 0.0 standard drinks  . Drug use: No    Types: Methylphenidate  . Sexual activity: Never  Other Topics Concern  . Not on file  Social History Narrative  . Not on file   Social Determinants of Health   Financial Resource Strain: Low Risk   . Difficulty of Paying Living Expenses: Not hard at all  Food Insecurity: No Food Insecurity  . Worried About Charity fundraiser in the Last Year: Never true  . Ran Out of Food in the Last Year: Never true  Transportation Needs: No Transportation Needs  . Lack of Transportation (Medical): No  . Lack of Transportation (Non-Medical): No  Physical Activity: Inactive  . Days of Exercise per Week: 0 days  . Minutes of Exercise per Session: 0 min  Stress: Stress Concern Present  . Feeling of Stress : Very much  Social Connections: Not on file     Family History: The patient's family history includes Cancer in her father and mother; Heart disease in her mother. There is no history of Kidney disease, Bladder Cancer, Prostate cancer, or Kidney cancer.  ROS:   Please see the history of present illness.     All other systems reviewed and are negative.  EKGs/Labs/Other Studies Reviewed:    The following studies were reviewed today:   EKG:  EKG not ordered today.   Recent Labs: 02/19/2020: ALT  19; B Natriuretic Peptide 22.0; Hemoglobin 14.2; Platelets 352; TSH 0.902 08/28/2020: BUN 23; Creatinine, Ser 0.85; Potassium 4.2; Sodium 139  Recent Lipid Panel    Component Value Date/Time   CHOL 131 08/01/2020 1254   TRIG 352.0 (H) 08/01/2020 1254   HDL  46.80 08/01/2020 1254   CHOLHDL 3 08/01/2020 1254   VLDL 70.4 (H) 08/01/2020 1254   LDLCALC 223 (H) 11/13/2014 0908   LDLDIRECT 53.0 08/01/2020 1254    Physical Exam:    VS:  BP 110/70 (BP Location: Left Arm, Patient Position: Sitting, Cuff Size: Normal)   Pulse 97   Ht 5\' 9"  (1.753 m)   Wt 251 lb 2 oz (113.9 kg)   LMP 08/23/2014   SpO2 98%   BMI 37.08 kg/m     Wt Readings from Last 3 Encounters:  10/31/20 251 lb 2 oz (113.9 kg)  10/28/20 241 lb (109.3 kg)  10/09/20 243 lb 4 oz (110.3 kg)     GEN:  Well nourished, well developed in no acute distress HEENT: Normal NECK: No JVD; No carotid bruits LYMPHATICS: No lymphadenopathy CARDIAC: RRR, no murmurs, rubs, gallops RESPIRATORY:  Clear to auscultation without rales,  ABDOMEN: Soft, non-tender, distended MUSCULOSKELETAL:  No edema; No deformity  SKIN: Warm and dry NEUROLOGIC:  Alert and oriented x 3 PSYCHIATRIC:  Normal affect   ASSESSMENT:    1. Inappropriate sinus tachycardia   2. Pure hypercholesterolemia   3. BMI 35.0-35.9,adult    PLAN:    In order of problems listed above:  1. Inappropriate sinus tach, heart rate improved on beta-blocker.  Continue Toprol-XL 50 mg daily. 2. History of hyperlipidemia, intolerant to statins..  Total and LDL at goal.  Triglycerides elevated.  Continue Repatha, start fenofibrate.  Repeat lipid panel in 6 months. 3. Obesity, deconditioning.  Low-calorie diet recommended.  Follow-up in 6 months   Medication Adjustments/Labs and Tests Ordered: Current medicines are reviewed at length with the patient today.  Concerns regarding medicines are outlined above.  Orders Placed This Encounter  Procedures  . Lipid panel  . EKG  12-Lead   Meds ordered this encounter  Medications  . fenofibrate (TRICOR) 145 MG tablet    Sig: Take 1 tablet (145 mg total) by mouth daily.    Dispense:  30 tablet    Refill:  5  . metoprolol succinate (TOPROL-XL) 50 MG 24 hr tablet    Sig: Take 1 tablet (50 mg total) by mouth daily. Take with or immediately following a meal.    Dispense:  30 tablet    Refill:  5    Patient Instructions  Medication Instructions:   START taking fenofibrate (TRICOR) 145 MG tablet: Take 1 tablet by mouth once a day.  *If you need a refill on your cardiac medications before your next appointment, please call your pharmacy*   Lab Work:  Your physician recommends that you return for a FASTING lipid profile: Just prior to your 6 month follow up.  - You will need to be fasting. Please do not have anything to eat or drink after midnight the morning you have the lab work. You may only have water or black coffee with no cream or sugar.  - Please go to the [facility]. You will check in at the front desk to the right as you walk into the atrium. Valet Parking is offered if needed. - No appointment needed. You may go any day between 7 am and 6 pm.    Testing/Procedures: None Ordered   Follow-Up: At [facility], you and your health needs are our priority.  As part of our continuing mission to provide you with exceptional heart care, we have created designated Provider Care Teams.  These Care Teams include your primary Cardiologist (physician) and Advanced Practice  Providers (APPs -  Physician Assistants and Nurse Practitioners) who all work together to provide you with the care you need, when you need it.  We recommend signing up for the patient portal called "MyChart".  Sign up information is provided on this After Visit Summary.  MyChart is used to connect with patients for Virtual Visits (Telemedicine).  Patients are able to view lab/test results, encounter notes, upcoming appointments, etc.   Non-urgent messages can be sent to your provider as well.   To learn more about what you can do with MyChart, go to [email or web address].    Your next appointment:   6 month(s)  The format for your next appointment:   In Person  Provider:   [name] Sable, MD   Other Instructions      Signed, [name] Sable, MD  10/31/2020 12:22 PM    [facility]

## 2020-10-31 NOTE — Patient Instructions (Signed)
Medication Instructions:   START taking fenofibrate (TRICOR) 145 MG tablet: Take 1 tablet by mouth once a day.  *If you need a refill on your cardiac medications before your next appointment, please call your pharmacy*   Lab Work:  Your physician recommends that you return for a FASTING lipid profile: Just prior to your 6 month follow up.  - You will need to be fasting. Please do not have anything to eat or drink after midnight the morning you have the lab work. You may only have water or black coffee with no cream or sugar.  - Please go to the [facility]. You will check in at the front desk to the right as you walk into the atrium. Valet Parking is offered if needed. - No appointment needed. You may go any day between 7 am and 6 pm.    Testing/Procedures: None Ordered   Follow-Up: At [facility], you and your health needs are our priority.  As part of our continuing mission to provide you with exceptional heart care, we have created designated Provider Care Teams.  These Care Teams include your primary Cardiologist (physician) and Advanced Practice Providers (APPs -  Physician Assistants and Nurse Practitioners) who all work together to provide you with the care you need, when you need it.  We recommend signing up for the patient portal called "MyChart".  Sign up information is provided on this After Visit Summary.  MyChart is used to connect with patients for Virtual Visits (Telemedicine).  Patients are able to view lab/test results, encounter notes, upcoming appointments, etc.  Non-urgent messages can be sent to your provider as well.   To learn more about what you can do with MyChart, go to [email or web address].    Your next appointment:   6 month(s)  The format for your next appointment:   In Person  Provider:   [name] Sable, MD   Other Instructions

## 2020-11-15 ENCOUNTER — Other Ambulatory Visit: Payer: Self-pay | Admitting: Primary Care

## 2020-11-15 DIAGNOSIS — E119 Type 2 diabetes mellitus without complications: Secondary | ICD-10-CM

## 2020-11-16 ENCOUNTER — Other Ambulatory Visit (HOSPITAL_COMMUNITY): Payer: Self-pay | Admitting: Psychiatry

## 2020-11-16 DIAGNOSIS — G4733 Obstructive sleep apnea (adult) (pediatric): Secondary | ICD-10-CM | POA: Diagnosis not present

## 2020-11-18 ENCOUNTER — Encounter: Payer: Self-pay | Admitting: Internal Medicine

## 2020-11-19 ENCOUNTER — Ambulatory Visit (INDEPENDENT_AMBULATORY_CARE_PROVIDER_SITE_OTHER): Payer: PPO | Admitting: Internal Medicine

## 2020-11-19 ENCOUNTER — Other Ambulatory Visit: Payer: Self-pay

## 2020-11-19 ENCOUNTER — Other Ambulatory Visit (HOSPITAL_COMMUNITY): Payer: Self-pay | Admitting: Psychiatry

## 2020-11-19 ENCOUNTER — Encounter: Payer: Self-pay | Admitting: Internal Medicine

## 2020-11-19 VITALS — BP 124/76 | HR 103 | Temp 97.7°F | Wt 245.0 lb

## 2020-11-19 DIAGNOSIS — J0100 Acute maxillary sinusitis, unspecified: Secondary | ICD-10-CM | POA: Diagnosis not present

## 2020-11-19 DIAGNOSIS — N6452 Nipple discharge: Secondary | ICD-10-CM | POA: Diagnosis not present

## 2020-11-19 MED ORDER — AZITHROMYCIN 250 MG PO TABS: ORAL_TABLET | ORAL | 0 refills | Status: DC

## 2020-11-19 NOTE — Telephone Encounter (Signed)
Viewed schedule and appointment made.

## 2020-11-19 NOTE — Progress Notes (Signed)
Subjective:    Patient ID: Emily Moon, female    DOB: 08-15-1961, 60 y.o.   MRN: [id number]  HPI  Pt presents to the clinic today with c/o a discharge from her left nipple. She noticed this about 1 week ago. She reports her breasts are tender but this is not new for her. Her last mammogram was in 2015.  She also reports left maxillary pain and pressure, as well as pain in her teeth. This started about 3 weeks ago. She was given Augmentin by her PCP but reports this did not improve her symptoms. She reports Zpacks typically work well for her, she would like to know if she could get one of those today.  Review of Systems      Past Medical History:  Diagnosis Date  . Arthritis    osteo  . Asthma   . Bipolar disorder ([place]) 05/21/14  . Cataracts, bilateral   . DDD (degenerative disc disease), cervical    also back  . Depression   . Dizziness    Positional  . Edema    feet/legs  . Fibromyalgia syndrome   . Fungal infection    Finger nails  . GERD (gastroesophageal reflux disease)   . Gout   . Headache    seasonal allergies  . Heart palpitations   . Hip dysplasia, congenital 09/15/2013  . Hypercholesterolemia   . Multiple sclerosis (H[facility])    weakness  . Nephrolithiasis    kidney stones  . Osteoporosis    osteoarthritis  . Pneumonia   . PONV (postoperative nausea and vomiting)    no problem after cataract surgery  . Psoriasis   . Renal stone   . Shortness of breath dyspnea    wheezing  . Sleep apnea 2012   sleep study / slight, no interventions  . Urinary frequency     Current Outpatient Medications  Medication Sig Dispense Refill  . amoxicillin-clavulanate (AUGMENTIN) 875-125 MG tablet Take 1 tablet by mouth 2 (two) times daily. 14 tablet 0  . ASPERCREME LIDOCAINE EX Apply 1 application topically 4 (four) times daily as needed (for pain.).     Marland Kitchen ASPIRIN 81 PO Take by mouth daily.    Marland Kitchen azelastine (ASTELIN) 0.1 % nasal spray PLACE 2 SPRAYS INTO BOTH  NOSTRILS 2 (TWO) TIMES DAILY 30 mL 1  . BIOTIN PO Take 200 mg by mouth daily.     . budesonide-formoterol (SYMBICORT) 80-4.5 MCG/ACT inhaler Inhale 2 puffs into the lungs 2 (two) times daily. 1 each 6  . calcipotriene-betamethasone (TACLONEX) ointment Apply topically daily. 60 g 0  . cetirizine (ZYRTEC) 10 MG tablet Take 10 mg by mouth 2 (two) times daily.    . Cholecalciferol (VITAMIN D3) 10000 units capsule Take 30,000 Units by mouth daily.     . ciclopirox (LOPROX) 0.77 % cream Apply topically 2 (two) times daily. 30 g 0  . clobetasol (TEMOVATE) 0.05 % external solution Mix clobetasol solution with  CeraVe cream Use twice daily to affected areas.Avoid Face, groin and underarm 50 mL 0  . Continuous Blood Gluc Sensor (FREESTYLE LIBRE 14 DAY SENSOR) MISC PLACE INTO SKIN FOR CONTINUOUS CHECK OF BLOOD SUGAR DAILY 2 each 2  . cyanocobalamin (,VITAMIN B-12,) 1000 MCG/ML injection Inject 1 mL (1,000 mcg total) into the muscle every 3 (three) months. 1 mL 0  . desipramine (NORPRAMIN) 25 MG tablet TAKE 1 TABLET BY MOUTH IN THE MORNING 30 tablet 5  . diclofenac sodium (VOLTAREN) 1 % GEL Apply  4 g topically 4 (four) times daily. 500 g 5  . docusate sodium (COLACE) 100 MG capsule Take 1 capsule (100 mg total) by mouth 2 (two) times daily. 60 capsule 0  . Dulaglutide (TRULICITY) 0.75 MG/0.5ML SOPN Use weekly, E11.9 1.5 mL 0  . DULoxetine (CYMBALTA) 20 MG capsule Take 2 capsules (40 mg total) by mouth daily. 180 capsule 2  . Evolocumab (REPATHA SURECLICK) 140 MG/ML SOAJ Inject 1 Dose into the skin every 14 (fourteen) days. 2 pen 11  . famotidine (PEPCID) 20 MG tablet TAKE 1 TABLET BY MOUTH EVERYDAY AT BEDTIME 90 tablet 3  . fenofibrate (TRICOR) 145 MG tablet Take 1 tablet (145 mg total) by mouth daily. 30 tablet 5  . fluocinonide (LIDEX) 0.05 % external solution Apply 1 application topically 2 (two) times daily as needed. Apply to scalp twice daily as need for itch 180 mL 3  . hydrocortisone 2.5 % cream Apply  BID to affected area in underarms and groin prn flares    . hydroquinone 4 % cream 1 APPLICATION TOPICALLY DAILY AS NEEDED FOR BLEMISHES.  3  . hydrOXYzine (VISTARIL) 50 MG capsule TAKE 1 CAPSLE BY MOUTH AT NOON, 1 AT 6 PM, AND 1 AS NEEDED 270 capsule 2  . lamoTRIgine (LAMICTAL) 150 MG tablet Take 2 tablets (300 mg total) by mouth at bedtime. 180 tablet 1  . levalbuterol (XOPENEX HFA) 45 MCG/ACT inhaler Inhale 1-2 puffs into the lungs every 6 (six) hours as needed for wheezing. 15 g 0  . levalbuterol (XOPENEX) 1.25 MG/3ML nebulizer solution USE 1 VIAL VIA NEBULIZER EVERY 4 HOURS AS NEEDED FOR WHEEZING OR SHORTNESS OF BREATH 270 mL 1  . Lysine 1000 MG TABS Take 2,000-4,000 mg by mouth at bedtime. 2000 mg scheduled at bedtime and patient will take 4000 mg if she has outbreak    . Magnesium 500 MG TABS Take 1 tablet by mouth at bedtime.    . methocarbamol (ROBAXIN) 500 MG tablet Take 1-1.5 tablets (500-750 mg total) by mouth daily as needed for muscle spasms. 135 tablet 0  . metoprolol succinate (TOPROL-XL) 50 MG 24 hr tablet Take 1 tablet (50 mg total) by mouth daily. Take with or immediately following a meal. 30 tablet 5  . neomycin-bacitracin-polymyxin (NEOSPORIN) [phone] ointment Apply 1 application topically 4 (four) times daily as needed (for cut/scrapes.).    Marland Kitchen Olopatadine HCl 0.2 % SOLN 1 drop once daily    . pantoprazole (PROTONIX) 40 MG tablet Take 1 tablet (40 mg total) by mouth 2 (two) times daily. 180 tablet 1  . phenazopyridine (PYRIDIUM) 95 MG tablet Take 95 mg by mouth 3 (three) times daily as needed for pain.    . polyethylene glycol powder (GLYCOLAX/MIRALAX) powder MIX 17 GRAMS (1 CAPFUL) WITH 4-8 OZ OF LIQUID AND TAKE BY MOUTH TWICE DAILY AS NEEDED 527 g 0  . temazepam (RESTORIL) 30 MG capsule TAKE ONE TO TWO CAPSULES BY MOUTH NIGHTLY AT BEDTIME 60 capsule 4  . traZODone (DESYREL) 50 MG tablet Take 2 tablets (100 mg total) by mouth at bedtime. 60 tablet 7  . valACYclovir (VALTREX)  1000 MG tablet Take 1 tablet (1,000 mg total) by mouth 2 (two) times daily. 10 tablet 0  . zinc gluconate 50 MG tablet Take 50 mg by mouth daily.     No current facility-administered medications for this visit.    Allergies  Allergen Reactions  . Albuterol Shortness Of Breath and Other (See Comments)    Makes pt feel jittery/  tacycardic  . Crestor [Rosuvastatin] Other (See Comments)    Joint pain, muscle pain, and hair loss  . Halcion [Triazolam] Other (See Comments)    Dizziness,headaches,bladder problems  . Levaquin [Levofloxacin In D5w] Diarrhea and Itching    Shoulder pain  . Naproxen Sodium Swelling    Patient tolerates in small doses  . Tylenol [Acetaminophen] Swelling    Patient tolerates in small doses  . Cefaclor Other (See Comments)    Doesn't remember---unsure if actually allergic   . Diclofenac Sodium Other (See Comments)    "made very sick"  . Sulfa Antibiotics Itching    Unsure of reaction possibly itching Unsure of reaction possibly itching  . Tramadol Itching and Nausea And Vomiting  . Aripiprazole Other (See Comments)    Muscle tension/cramping  . Diclofenac Sodium Rash    "made very sick"  . Ibuprofen Swelling    Patient tolerates in small doses    Family History  Problem Relation Age of Onset  . Cancer Father        Abdomen with mastasis  . Cancer Mother   . Heart disease Mother   . Kidney disease Neg Hx   . Bladder Cancer Neg Hx   . Prostate cancer Neg Hx   . Kidney cancer Neg Hx     Social History   Socioeconomic History  . Marital status: Divorced    Spouse name: Not on file  . Number of children: 1  . Years of education: Not on file  . Highest education level: Not on file  Occupational History  . 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 Smoking status: Former Smoker    Years: 25.00    Types: Cigarettes    Quit date: 11/16/2012    Years since quitting: 8.0  . Smokeless tobacco: Never Used  .  Tobacco comment: occasional use  Vaping Use  . Vaping Use: Former  Substance and Sexual Activity  . Alcohol use: No    Alcohol/week: 0.0 standard drinks  . Drug use: No    Types: Methylphenidate  . Sexual activity: Never  Other Topics Concern  . Not on file  Social History Narrative  . Not on file   Social Determinants of Health   Financial Resource Strain: Low Risk   . Difficulty of Paying Living Expenses: Not hard at all  Food Insecurity: No Food Insecurity  . Worried About Charity fundraiser in the Last Year: Never true  . Ran Out of Food in the Last Year: Never true  Transportation Needs: No Transportation Needs  . Lack of Transportation (Medical): No  . Lack of Transportation (Non-Medical): No  Physical Activity: Inactive  . Days of Exercise per Week: 0 days  . Minutes of Exercise per Session: 0 min  Stress: Stress Concern Present  . Feeling of Stress : Very much  Social Connections: Not on file  Intimate Partner Violence: Not At Risk  . Fear of Current or Ex-Partner: No  . Emotionally Abused: No  . Physically Abused: No  . Sexually Abused: No     Constitutional: Denies fever, malaise, fatigue, headache or abrupt weight changes.  HEENT: Pt reports left maxillary pain and pressure. Denies eye pain, eye redness, ear pain, ringing in the ears, wax buildup, runny nose, nasal congestion, bloody nose, or sore throat. Respiratory: Denies difficulty breathing, shortness of breath, cough or sputum production.   Cardiovascular: Denies chest pain, chest tightness, palpitations or  swelling in the hands or feet.  Skin: Pt reports discharge from left nipple. Denies redness, rashes, lesions or ulcercations.   No other specific complaints in a complete review of systems (except as listed in HPI above).  Objective:   Physical Exam  BP 124/76   Pulse (!) 103   Temp 97.7 F (36.5 C) (Temporal)   Wt 245 lb (111.1 kg)   LMP 08/23/2014   SpO2 97%   BMI 36.18 kg/m   Wt  Readings from Last 3 Encounters:  10/31/20 251 lb 2 oz (113.9 kg)  10/28/20 241 lb (109.3 kg)  10/09/20 243 lb 4 oz (110.3 kg)    General: Appears her stated age, obese in NAD. Breast: Symmetrical. Indention noted of left nipple but no discharge expressed. Breast without masses or lumps.  HEENT: Head: normal shape and size, left maxillary sinus pressure noted;  Neck:  No adenopathy noted. Cardiovascular: Tachycardic. Pulmonary/Chest: Normal effort a nd positive vesicular breath sounds.  Neurological: Alert and oriented.    BMET    Component Value Date/Time   NA 139 08/28/2020 1233   K 4.2 08/28/2020 1233   CL 100 08/28/2020 1233   CO2 29 08/28/2020 1233   GLUCOSE 122 (H) 08/28/2020 1233   BUN 23 08/28/2020 1233   CREATININE 0.85 08/28/2020 1233   CALCIUM 9.1 08/28/2020 1233   GFRNONAA >60 02/19/2020 1553   GFRAA >60 02/19/2020 1553    Lipid Panel     Component Value Date/Time   CHOL 131 08/01/2020 1254   TRIG 352.0 (H) 08/01/2020 1254   HDL 46.80 08/01/2020 1254   CHOLHDL 3 08/01/2020 1254   VLDL 70.4 (H) 08/01/2020 1254   LDLCALC 223 (H) 11/13/2014 0908    CBC    Component Value Date/Time   WBC 8.3 02/19/2020 1553   RBC 4.62 02/19/2020 1553   HGB 14.2 02/19/2020 1553   HCT 42.2 02/19/2020 1553   PLT 352 02/19/2020 1553   MCV 91.3 02/19/2020 1553   MCH 30.7 02/19/2020 1553   MCHC 33.6 02/19/2020 1553   RDW 12.7 02/19/2020 1553   LYMPHSABS 1.8 02/19/2020 1553   MONOABS 0.4 02/19/2020 1553   EOSABS 0.4 02/19/2020 1553   BASOSABS 0.1 02/19/2020 1553    Hgb A1C Lab Results  Component Value Date   HGBA1C 6.7 (H) 08/01/2020           Assessment & Plan:  Nipple Discharge, Left:  Will obtain diagnostic mammogram and ultrasound of bilateral breast.  Acute Left Maxillary Sinusitis:  RX for Zpack per request  Follow up with PCP if symptoms persist or worsen  Webb Silversmith, NP This visit occurred during the SARS-CoV-2 public health emergency.   Safety protocols were in place, including screening questions prior to the visit, additional usage of staff PPE, and extensive cleaning of exam room while observing appropriate contact time as indicated for disinfecting solutions.

## 2020-11-19 NOTE — Telephone Encounter (Signed)
Do you know if she has app? If not do you mind calling to make one?

## 2020-11-19 NOTE — Patient Instructions (Signed)
Galactorrhea Galactorrhea is the flow of a milky fluid (discharge) from the breast. It is different from normal milk in nursing mothers. The fluid can be white, yellow, or green. This condition can be caused by many things. Most cases are not serious and do not require treatment. Watch your condition to make sure it goes away. Follow these instructions at home: Breast care   Watch your condition for any changes.  Do not squeeze your breasts or nipples.  Avoid touching your breasts when you are having sex.  Perform a breast self-exam once a month.  Avoid clothes that rub on your nipples.  Use breast pads to absorb the fluid.  Wear a support bra or a breast binder. General instructions  Take over-the-counter and prescription medicines only as told by your doctor.  Keep all follow-up visits as told by your doctor. This is important. Contact a doctor if you:  Have hot flashes.  Have vaginal dryness.  Have no desire for sex.  Stop having periods, or have periods that are irregular or far apart.  Have headaches.  Cannot see well. Get help right away if:  Your breast discharge is bloody or yellowish white (puslike).  You have breast pain.  You feel a lump in your breast.  Your breast shows wrinkling or dimpling.  Your breast becomes red and swollen. Summary  Galactorrhea is the flow of a milky fluid (discharge) from the breast.  This condition may be caused by many things, but it is not usually serious.  Watch your condition carefully to make sure that it goes away.  Get help right away if the fluid is bloody or yellowish white, or if you have a lump, pain, or skin changes on your breast. This information is not intended to replace advice given to you by your health care provider. Make sure you discuss any questions you have with your health care provider. Document Revised: 11/15/2017 Document Reviewed: 11/15/2017 Elsevier Patient Education  2020 [employer]  

## 2020-11-20 ENCOUNTER — Other Ambulatory Visit: Payer: Self-pay | Admitting: Primary Care

## 2020-11-20 DIAGNOSIS — R059 Cough, unspecified: Secondary | ICD-10-CM

## 2020-11-20 NOTE — Addendum Note (Signed)
Addended by: Maisie Fus on: 11/20/2020 10:19 AM   Modules accepted: Orders

## 2020-11-20 NOTE — Telephone Encounter (Signed)
Last VV 10/28/20 Last fill 07/01/20  #15g/0

## 2020-11-20 NOTE — Telephone Encounter (Signed)
Refills sent to pharmacy. 

## 2020-11-20 NOTE — Telephone Encounter (Signed)
Please advise 

## 2020-11-21 DIAGNOSIS — M1611 Unilateral primary osteoarthritis, right hip: Secondary | ICD-10-CM | POA: Diagnosis not present

## 2020-11-21 DIAGNOSIS — M25552 Pain in left hip: Secondary | ICD-10-CM | POA: Diagnosis not present

## 2020-11-27 ENCOUNTER — Other Ambulatory Visit: Payer: PPO

## 2020-11-27 ENCOUNTER — Other Ambulatory Visit: Payer: Self-pay

## 2020-11-27 ENCOUNTER — Telehealth (INDEPENDENT_AMBULATORY_CARE_PROVIDER_SITE_OTHER): Payer: PPO | Admitting: Psychiatry

## 2020-11-27 DIAGNOSIS — G4733 Obstructive sleep apnea (adult) (pediatric): Secondary | ICD-10-CM | POA: Diagnosis not present

## 2020-11-27 DIAGNOSIS — F331 Major depressive disorder, recurrent, moderate: Secondary | ICD-10-CM | POA: Diagnosis not present

## 2020-11-27 DIAGNOSIS — F310 Bipolar disorder, current episode hypomanic: Secondary | ICD-10-CM | POA: Diagnosis not present

## 2020-11-27 MED ORDER — HYDROXYZINE PAMOATE 50 MG PO CAPS: ORAL_CAPSULE | ORAL | 2 refills | Status: DC

## 2020-11-27 MED ORDER — DULOXETINE HCL 20 MG PO CPEP: 40.0000 mg | ORAL_CAPSULE | Freq: Every day | ORAL | 2 refills | Status: DC

## 2020-11-27 MED ORDER — LAMOTRIGINE 150 MG PO TABS: 300.0000 mg | ORAL_TABLET | Freq: Every day | ORAL | 1 refills | Status: DC

## 2020-11-27 MED ORDER — DESIPRAMINE HCL 25 MG PO TABS: ORAL_TABLET | ORAL | 5 refills | Status: DC

## 2020-11-27 MED ORDER — TEMAZEPAM 30 MG PO CAPS: ORAL_CAPSULE | ORAL | 4 refills | Status: DC

## 2020-11-27 MED ORDER — TRAZODONE HCL 50 MG PO TABS: 100.0000 mg | ORAL_TABLET | Freq: Every day | ORAL | 7 refills | Status: DC

## 2020-11-27 NOTE — Progress Notes (Signed)
The most painful.  This  Psychiatric Initial Adult Assessment   Patient Identification: Emily Moon MRN:  [id number] Date of Evaluation:  11/27/2020 Referral Source: From the community Chief Complaint: Feels exhausted    Today I think this patient is at her baseline.  Her baseline is not good.  She has multiple medical illnesses.  He has multiple metabolic illnesses.  She has diabetes hypercholesterolemia and she is over 60 pounds overweight.  Patient has MS.  She says she has some cognitive difficulties that she attributed to the MS.  I am not clear that is the case.  Patient says she feels depressed a lot.  She has a best friend who spends a lot of time with her whose name is [place].  Her best friend was there in this interview.  Patient has a lot of other orthopedic problems as well.  She apparently was told that she needs hip replacement.  She also has spinal stenosis.  The patient has a sister who she spends some time with.  Her son and daughter-in-law moved to [place].  This is actually closer to her.  The patient shows no signs of mania.  The patient goes to sleep every morning at 4 AM and sleeps to about 10 or 11.  She does not really take naps.  The patient continues to be excessively and more at night.  She pretty much has an excuse for any intervention.  She saw a nutritionist gave her a diabetic diet the patient overly acknowledges that she does not follow.  The patient takes trazodone and Restoril for sleep.  My recommendation is to reduce anything lead to a wish not to.  She believes she is on the right medications.  Patient: Emily Moon  Procedure(s) Performed: * No surgery found *  Anesthes  Patient location:   Post pain:   Post assessment:   Last Vitals:  There were no vitals filed for this visit.  Post vital signs:   Level of consciousness:   Complications: . Associated Signs/Symptoms: Depression Symptoms:  hopelessness, (Hypo) Manic Symptoms:    Anxiety Symptoms:   Psychotic Symptoms:   PTSD Symptoms:   Past Psychiatric History: The patient was seen in a psychiatric office and takes multiple medications she's not been recently hospitalized.  Previous Psychotropic Medications: Yes   Substance Abuse History in the last 12 months:  No.  Consequences of Substance Abuse: Negative  Past Medical History:  Past Medical History:  Diagnosis Date  . Arthritis    osteo  . Asthma   . Bipolar disorder ([place]) 05/21/14  . Cataracts, bilateral   . DDD (degenerative disc disease), cervical    also back  . Depression   . Dizziness    Positional  . Edema    feet/legs  . Fibromyalgia syndrome   . Fungal infection    Finger nails  . GERD (gastroesophageal reflux disease)   . Gout   . Headache    seasonal allergies  . Heart palpitations   . 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 Hypercholesterolemia   . Multiple sclerosis (HCC)    weakness  . Nephrolithiasis    kidney stones  . Osteoporosis    osteoarthritis  . Pneumonia   . PONV (postoperative nausea and vomiting)    no problem after cataract surgery  . Psoriasis   . Renal stone   . Shortness of breath dyspnea    wheezing  . Sleep apnea 2012   sleep study / slight,  no interventions  . Urinary frequency     Past Surgical History:  Procedure Laterality Date  . CATARACT EXTRACTION W/PHACO Left 05/21/2015   Procedure: CATARACT EXTRACTION PHACO AND INTRAOCULAR LENS PLACEMENT (IOC);  Surgeon: Birder Robson, MD;  Location: [facility];  Service: Ophthalmology;  Laterality: Left;  [place] 00:35 AP% 22.9 CDE 8.11 fluid pack lot #[id number]  . CATARACT EXTRACTION W/PHACO Right 06/04/2015   Procedure: CATARACT EXTRACTION PHACO AND INTRAOCULAR LENS PLACEMENT (IOC);  Surgeon: Birder Robson, MD;  Location: [facility];  Service: Ophthalmology;  Laterality: Right;  US:00:48 AP%: 10.5 CDE:5.08 Fluid lot #[id number]  . CYSTOSCOPY/URETEROSCOPY/HOLMIUM LASER/STENT PLACEMENT Bilateral  09/22/2016   Procedure: CYSTOSCOPY/URETEROSCOPY/HOLMIUM LASER/STENT PLACEMENT;  Surgeon: Hollice Espy, MD;  Location: [facility];  Service: Urology;  Laterality: Bilateral;  . EYE SURGERY  2015   tissue biopsy  . FOOT SURGERY  2015  . JOINT REPLACEMENT Left 2013   hip replacement  . LITHOTRIPSY    . PTOSIS REPAIR Bilateral 02/18/2016   Procedure: BILATERAL PTOSIS REPAIR UPPER EYELIDS;  Surgeon: Karle Starch, MD;  Location: [facility];  Service: Ophthalmology;  Laterality: Bilateral;  LEAVE PT EARLY AM  . thumb surgery Right   . TONSILLECTOMY  1973    Family Psychiatric History:   Family History:  Family History  Problem Relation Age of Onset  . Cancer Father        Abdomen with mastasis  . Cancer Mother   . Heart disease Mother   . Kidney disease Neg Hx   . Bladder Cancer Neg Hx   . Prostate cancer Neg Hx   . Kidney cancer Neg Hx     Social History:   Social History   Socioeconomic History  . Marital status: Divorced    Spouse name: Not on file  . Number of children: 1  . Years of education: Not on file  . Highest education level: Not on file  Occupational History  . 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 Smoking status: Former Smoker    Years: 25.00    Types: Cigarettes    Quit date: 11/16/2012    Years since quitting: 8.0  . Smokeless tobacco: Never Used  . Tobacco comment: occasional use  Vaping Use  . Vaping Use: Former  Substance and Sexual Activity  . Alcohol use: No    Alcohol/week: 0.0 standard drinks  . Drug use: No    Types: Methylphenidate  . Sexual activity: Never  Other Topics Concern  . Not on file  Social History Narrative  . Not on file   Social Determinants of Health   Financial Resource Strain: Low Risk   . Difficulty of Paying Living Expenses: Not hard at all  Food Insecurity: No Food Insecurity  . Worried About Charity fundraiser in the Last Year: Never true  . Ran Out of Food in  the Last Year: Never true  Transportation Needs: No Transportation Needs  . Lack of Transportation (Medical): No  . Lack of Transportation (Non-Medical): No  Physical Activity: Inactive  . Days of Exercise per Week: 0 days  . Minutes of Exercise per Session: 0 min  Stress: Stress Concern Present  . Feeling of Stress : Very much  Social Connections: Not on file    Additional Social History:   Allergies:   Allergies  Allergen Reactions  . Albuterol Shortness Of Breath and Other (See Comments)    Makes pt feel jittery/ tacycardic  .  Crestor [Rosuvastatin] Other (See Comments)    Joint pain, muscle pain, and hair loss  . Halcion [Triazolam] Other (See Comments)    Dizziness,headaches,bladder problems  . Levaquin [Levofloxacin In D5w] Diarrhea and Itching    Shoulder pain  . Naproxen Sodium Swelling    Patient tolerates in small doses  . Tylenol [Acetaminophen] Swelling    Patient tolerates in small doses  . Cefaclor Other (See Comments)    Doesn't remember---unsure if actually allergic   . Diclofenac Sodium Other (See Comments)    "made very sick"  . Sulfa Antibiotics Itching    Unsure of reaction possibly itching Unsure of reaction possibly itching  . Tramadol Itching and Nausea And Vomiting  . Aripiprazole Other (See Comments)    Muscle tension/cramping  . Diclofenac Sodium Rash    "made very sick"  . Ibuprofen Swelling    Patient tolerates in small doses    Metabolic Disorder Labs: Lab Results  Component Value Date   HGBA1C 6.7 (H) 08/01/2020   No results found for: PROLACTIN Lab Results  Component Value Date   CHOL 131 08/01/2020   TRIG 352.0 (H) 08/01/2020   HDL 46.80 08/01/2020   CHOLHDL 3 08/01/2020   VLDL 70.4 (H) 08/01/2020   LDLCALC 223 (H) 11/13/2014     Current Medications: Current Outpatient Medications  Medication Sig Dispense Refill  . ASPERCREME LIDOCAINE EX Apply 1 application topically 4 (four) times daily as needed (for pain.).     Marland Kitchen  ASPIRIN 81 PO Take by mouth daily.    Marland Kitchen azelastine (ASTELIN) 0.1 % nasal spray PLACE 2 SPRAYS INTO BOTH NOSTRILS 2 (TWO) TIMES DAILY 30 mL 1  . azithromycin (ZITHROMAX) 250 MG tablet Take 2 tabs today, then 1 tab daily x 4 days 6 tablet 0  . BIOTIN PO Take 200 mg by mouth daily.     . budesonide-formoterol (SYMBICORT) 80-4.5 MCG/ACT inhaler Inhale 2 puffs into the lungs 2 (two) times daily. 1 each 6  . calcipotriene-betamethasone (TACLONEX) ointment Apply topically daily. 60 g 0  . cetirizine (ZYRTEC) 10 MG tablet Take 10 mg by mouth 2 (two) times daily.    . Cholecalciferol (VITAMIN D3) 10000 units capsule Take 30,000 Units by mouth daily.     . ciclopirox (LOPROX) 0.77 % cream Apply topically 2 (two) times daily. 30 g 0  . clobetasol (TEMOVATE) 0.05 % external solution Mix clobetasol solution with  CeraVe cream Use twice daily to affected areas.Avoid Face, groin and underarm 50 mL 0  . Continuous Blood Gluc Sensor (FREESTYLE LIBRE 14 DAY SENSOR) MISC PLACE INTO SKIN FOR CONTINUOUS CHECK OF BLOOD SUGAR DAILY 2 each 2  . cyanocobalamin (,VITAMIN B-12,) 1000 MCG/ML injection Inject 1 mL (1,000 mcg total) into the muscle every 3 (three) months. 1 mL 0  . desipramine (NORPRAMIN) 25 MG tablet TAKE 1 TABLET BY MOUTH IN THE MORNING 30 tablet 5  . diclofenac sodium (VOLTAREN) 1 % GEL Apply 4 g topically 4 (four) times daily. 500 g 5  . docusate sodium (COLACE) 100 MG capsule Take 1 capsule (100 mg total) by mouth 2 (two) times daily. 60 capsule 0  . Dulaglutide (TRULICITY) A999333 0000000 SOPN Use weekly, E11.9 1.5 mL 0  . DULoxetine (CYMBALTA) 20 MG capsule Take 2 capsules (40 mg total) by mouth daily. 180 capsule 2  . Evolocumab (REPATHA SURECLICK) [id number] MG/ML SOAJ Inject 1 Dose into the skin every 14 (fourteen) days. 2 pen 11  . famotidine (PEPCID) 20 MG tablet TAKE  1 TABLET BY MOUTH EVERYDAY AT BEDTIME 90 tablet 3  . fenofibrate (TRICOR) 145 MG tablet Take 1 tablet (145 mg total) by mouth daily. 30  tablet 5  . fluocinonide (LIDEX) 0.05 % external solution Apply 1 application topically 2 (two) times daily as needed. Apply to scalp twice daily as need for itch 180 mL 3  . hydrocortisone 2.5 % cream Apply BID to affected area in underarms and groin prn flares    . hydroquinone 4 % cream 1 APPLICATION TOPICALLY DAILY AS NEEDED FOR BLEMISHES.  3  . hydrOXYzine (VISTARIL) 50 MG capsule TAKE 1 CAPSLE BY MOUTH AT NOON, 1 AT 6 PM, AND 1 AS NEEDED 270 capsule 2  . lamoTRIgine (LAMICTAL) 150 MG tablet Take 2 tablets (300 mg total) by mouth at bedtime. 180 tablet 1  . levalbuterol (XOPENEX HFA) 45 MCG/ACT inhaler INHALE 1 TO 2 PUFFS BY MOUTH EVERY 6 HOURS AS NEEDED FOR WHEEZE 1 each 0  . levalbuterol (XOPENEX) 1.25 MG/3ML nebulizer solution USE 1 VIAL VIA NEBULIZER EVERY 4 HOURS AS NEEDED FOR WHEEZING OR SHORTNESS OF BREATH 270 mL 1  . Lysine 1000 MG TABS Take 2,000-4,000 mg by mouth at bedtime. 2000 mg scheduled at bedtime and patient will take 4000 mg if she has outbreak    . Magnesium 500 MG TABS Take 1 tablet by mouth at bedtime.    . methocarbamol (ROBAXIN) 500 MG tablet Take 1-1.5 tablets (500-750 mg total) by mouth daily as needed for muscle spasms. 135 tablet 0  . metoprolol succinate (TOPROL-XL) 50 MG 24 hr tablet Take 1 tablet (50 mg total) by mouth daily. Take with or immediately following a meal. 30 tablet 5  . neomycin-bacitracin-polymyxin (NEOSPORIN) [phone] ointment Apply 1 application topically 4 (four) times daily as needed (for cut/scrapes.).    Marland Kitchen Olopatadine HCl 0.2 % SOLN 1 drop once daily    . pantoprazole (PROTONIX) 40 MG tablet Take 1 tablet (40 mg total) by mouth 2 (two) times daily. 180 tablet 1  . phenazopyridine (PYRIDIUM) 95 MG tablet Take 95 mg by mouth 3 (three) times daily as needed for pain.    . polyethylene glycol powder (GLYCOLAX/MIRALAX) powder MIX 17 GRAMS (1 CAPFUL) WITH 4-8 OZ OF LIQUID AND TAKE BY MOUTH TWICE DAILY AS NEEDED 527 g 0  . temazepam (RESTORIL) 30 MG  capsule TAKE ONE TO TWO CAPSULES BY MOUTH NIGHTLY AT BEDTIME 60 capsule 4  . traZODone (DESYREL) 50 MG tablet Take 2 tablets (100 mg total) by mouth at bedtime. 60 tablet 7  . valACYclovir (VALTREX) 1000 MG tablet Take 1 tablet (1,000 mg total) by mouth 2 (two) times daily. 10 tablet 0  . zinc gluconate 50 MG tablet Take 50 mg by mouth daily.     No current facility-administered medications for this visit.    Neurologic: Headache: No Seizure: No Paresthesias:No  Musculoskeletal: Strength & Muscle Tone: within normal limits Gait & Station: normal Patient leans: N/A  Psychiatric Specialty Exam: ROS  Last menstrual period 08/23/2014.There is no height or weight on file to calculate BMI.  General Appearance: Casual  Eye Contact:  Good  Speech:  Clear and Coherent  Volume:  Normal  Mood:  Negative  Affect:  Appropriate  Thought Process:  Goal Directed  Orientation:  NA  Thought Content:  Logical  Suicidal Thoughts:  No  Homicidal Thoughts:  No  Memory:  Negative  Judgement:  Good  Insight:  Good  Psychomotor Activity:  Normal  Concentration:  Recall:    Fund of Knowledge:Good  Language: Good  Akathisia:  No  Handed:  Right  AIMS (if indicated):    Assets:  Desire for Improvement  ADL's:  Intact  Cognition: WNL  Sleep:      1/12/20224:11 PM   This patient is #1 problem is that of major depression.  She will continue taking desipramine.  She also takes Cymbalta and any attempts to increase Cymbalta led to side effects.  She also takes Lamictal which I think is helpful for her antidepressant treatment.  Her second problem is insomnia.  The patient continues to take Restoril 60 mg which she takes at about midnight.  I really do not think it has much of an affect that she finally falls asleep at about 4 in the morning.  She also takes trazodone and Vistaril.  She says all her medications are helpful and she does not believe she could sleep at all without them.  Today we did  not discuss therapy.  Her next visit hopefully will be in person in a couple of months.  At that time we will approach the importance of being in therapy.  Patient is not suicidal.  She is functioning only fairly well.

## 2020-12-03 ENCOUNTER — Other Ambulatory Visit: Payer: PPO

## 2020-12-10 ENCOUNTER — Encounter: Payer: Self-pay | Admitting: Primary Care

## 2020-12-10 ENCOUNTER — Other Ambulatory Visit: Payer: Self-pay

## 2020-12-10 ENCOUNTER — Ambulatory Visit (INDEPENDENT_AMBULATORY_CARE_PROVIDER_SITE_OTHER): Payer: PPO | Admitting: Primary Care

## 2020-12-10 VITALS — BP 118/62 | HR 110 | Temp 97.1°F | Ht 69.0 in | Wt 249.0 lb

## 2020-12-10 DIAGNOSIS — R2689 Other abnormalities of gait and mobility: Secondary | ICD-10-CM | POA: Insufficient documentation

## 2020-12-10 DIAGNOSIS — E119 Type 2 diabetes mellitus without complications: Secondary | ICD-10-CM

## 2020-12-10 DIAGNOSIS — G8929 Other chronic pain: Secondary | ICD-10-CM | POA: Diagnosis not present

## 2020-12-10 DIAGNOSIS — M545 Low back pain, unspecified: Secondary | ICD-10-CM | POA: Diagnosis not present

## 2020-12-10 LAB — POCT GLYCOSYLATED HEMOGLOBIN (HGB A1C): Hemoglobin A1C: 6.5 % — AB (ref 4.0–5.6)

## 2020-12-10 MED ORDER — TRULICITY 0.75 MG/0.5ML ~~LOC~~ SOAJ: 0.7500 mg | SUBCUTANEOUS | 3 refills | Status: DC

## 2020-12-10 NOTE — Assessment & Plan Note (Signed)
Chronic for the last year, progressing per patient. She is ambulatory with a cane today, does appear to be uncomfortable from her chronic back pain. Unable to stand erect.  Agree to approve walk in shower accommodation.

## 2020-12-10 NOTE — Progress Notes (Signed)
Subjective:    Patient ID: Emily Moon, female    DOB: April 29, 1961, 60 y.o.   MRN: [id number]  HPI  This visit occurred during the SARS-CoV-2 public health emergency.  Safety protocols were in place, including screening questions prior to the visit, additional usage of staff PPE, and extensive cleaning of exam room while observing appropriate contact time as indicated for disinfecting solutions.   Emily Moon is a 60 year old female with a history of osteoarthritis, OSA, asthma, chronic pain, type 2 diabetes, multiple sclerosis, bipolar disorder, insomnia, renal stones, hyperlipidemia who presents today for follow up of diabetes and to discuss a shower accomodation.   1) Type 2 Diabetes:  Current medications include: Trulicity [id number] mg weekly.  She is checking her blood glucose numerous times daily and is getting readings of:   Average: 124 Range: low 100's to mid 100's  Last A1C: 6.7 in September 2021, 6.5 today.  Last Eye Exam: UTD Last Foot Exam: Due Pneumonia Vaccination: 2016 ACE/ARB: None, due today Statin: Statin intolerant   She has been working on her diet, she's cut back on carbs, stopped eating french fries.   2) Imbalance: Chronic over the last year for which she attributes to multiple sclerosis and chronic back pain/osteoarthritis. She would like an accomodation for a walk in shower at her apartment complex. She currently has a tub and shower combination but she's having difficulty stepping over the tub wall to take a shower. She often feels imbalanced, loses her footing. She currently has friends come over while she showers in case she falls. She's fallen in the shower once before, this was years ago at her sister's house.   She has a form today for the accomodation request.  She sees her multiple sclerosis doctor once annually, currently following with neurosurgery for her back pain.  She mentions today that she will needed right hip replacement at some point.  BP  Readings from Last 3 Encounters:  12/10/20 118/62  11/19/20 124/76  10/31/20 110/70     Review of Systems  Eyes: Negative for visual disturbance.  Respiratory: Negative for shortness of breath.   Cardiovascular: Negative for chest pain.  Musculoskeletal: Positive for arthralgias and back pain.  Neurological: Negative for headaches.        Past Medical History:  Diagnosis Date  . Arthritis    osteo  . Asthma   . Bipolar disorder ([place]) 05/21/14  . Cataracts, bilateral   . DDD (degenerative disc disease), cervical    also back  . Depression   . Dizziness    Positional  . Edema    feet/legs  . Fibromyalgia syndrome   . Fungal infection    Finger nails  . GERD (gastroesophageal reflux disease)   . Gout   . Headache    seasonal allergies  . Heart palpitations   . 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 Hypercholesterolemia   . Multiple sclerosis ([facility])    weakness  . Nephrolithiasis    kidney stones  . Osteoporosis    osteoarthritis  . Pneumonia   . PONV (postoperative nausea and vomiting)    no problem after cataract surgery  . Psoriasis   . Renal stone   . Shortness of breath dyspnea    wheezing  . Sleep apnea 2012   sleep study / slight, no interventions  . Urinary frequency      Social History   Socioeconomic History  . Marital status: Divorced    Spouse name: Not  on file  . Number of children: 1  . Years of education: Not on file  . Highest education level: Not on file  Occupational History  . 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 Smoking status: Former Smoker    Years: 25.00    Types: Cigarettes    Quit date: 11/16/2012    Years since quitting: 8.0  . Smokeless tobacco: Never Used  . Tobacco comment: occasional use  Vaping Use  . Vaping Use: Former  Substance and Sexual Activity  . Alcohol use: No    Alcohol/week: 0.0 standard drinks  . Drug use: No    Types: Methylphenidate  . Sexual  activity: Never  Other Topics Concern  . Not on file  Social History Narrative  . Not on file   Social Determinants of Health   Financial Resource Strain: Low Risk   . Difficulty of Paying Living Expenses: Not hard at all  Food Insecurity: No Food Insecurity  . Worried About Charity fundraiser in the Last Year: Never true  . Ran Out of Food in the Last Year: Never true  Transportation Needs: No Transportation Needs  . Lack of Transportation (Medical): No  . Lack of Transportation (Non-Medical): No  Physical Activity: Inactive  . Days of Exercise per Week: 0 days  . Minutes of Exercise per Session: 0 min  Stress: Stress Concern Present  . Feeling of Stress : Very much  Social Connections: Not on file  Intimate Partner Violence: Not At Risk  . Fear of Current or Ex-Partner: No  . Emotionally Abused: No  . Physically Abused: No  . Sexually Abused: No    Past Surgical History:  Procedure Laterality Date  . CATARACT EXTRACTION W/PHACO Left 05/21/2015   Procedure: CATARACT EXTRACTION PHACO AND INTRAOCULAR LENS PLACEMENT (IOC);  Surgeon: Birder Robson, MD;  Location: [facility];  Service: Ophthalmology;  Laterality: Left;  [place] 00:35 AP% 22.9 CDE 8.11 fluid pack lot #[id number]  . CATARACT EXTRACTION W/PHACO Right 06/04/2015   Procedure: CATARACT EXTRACTION PHACO AND INTRAOCULAR LENS PLACEMENT (IOC);  Surgeon: Birder Robson, MD;  Location: [facility];  Service: Ophthalmology;  Laterality: Right;  US:00:48 AP%: 10.5 CDE:5.08 Fluid lot #[id number]  . CYSTOSCOPY/URETEROSCOPY/HOLMIUM LASER/STENT PLACEMENT Bilateral 09/22/2016   Procedure: CYSTOSCOPY/URETEROSCOPY/HOLMIUM LASER/STENT PLACEMENT;  Surgeon: Hollice Espy, MD;  Location: [facility];  Service: Urology;  Laterality: Bilateral;  . EYE SURGERY  2015   tissue biopsy  . FOOT SURGERY  2015  . JOINT REPLACEMENT Left 2013   hip replacement  . LITHOTRIPSY    . PTOSIS REPAIR Bilateral 02/18/2016   Procedure: BILATERAL PTOSIS REPAIR UPPER  EYELIDS;  Surgeon: Karle Starch, MD;  Location: [facility];  Service: Ophthalmology;  Laterality: Bilateral;  LEAVE PT EARLY AM  . thumb surgery Right   . TONSILLECTOMY  1973    Family History  Problem Relation Age of Onset  . Cancer Father        Abdomen with mastasis  . Cancer Mother   . Heart disease Mother   . Kidney disease Neg Hx   . Bladder Cancer Neg Hx   . Prostate cancer Neg Hx   . Kidney cancer Neg Hx     Allergies  Allergen Reactions  . Albuterol Shortness Of Breath and Other (See Comments)    Makes pt feel jittery/ tacycardic  . Crestor [Rosuvastatin] Other (See Comments)    Joint pain, muscle pain, and hair loss  .  Halcion [Triazolam] Other (See Comments)    Dizziness,headaches,bladder problems  . Levaquin [Levofloxacin In D5w] Diarrhea and Itching    Shoulder pain  . Naproxen Sodium Swelling    Patient tolerates in small doses  . Tylenol [Acetaminophen] Swelling    Patient tolerates in small doses  . Cefaclor Other (See Comments)    Doesn't remember---unsure if actually allergic   . Diclofenac Sodium Other (See Comments)    "made very sick"  . Sulfa Antibiotics Itching    Unsure of reaction possibly itching Unsure of reaction possibly itching  . Tramadol Itching and Nausea And Vomiting  . Zinc     constipation   . Aripiprazole Other (See Comments)    Muscle tension/cramping  . Diclofenac Sodium Rash    "made very sick"  . Ibuprofen Swelling    Patient tolerates in small doses    Current Outpatient Medications on File Prior to Visit  Medication Sig Dispense Refill  . ASPERCREME LIDOCAINE EX Apply 1 application topically 4 (four) times daily as needed (for pain.).     Marland Kitchen ASPIRIN 81 PO Take by mouth daily.    Marland Kitchen azelastine (ASTELIN) 0.1 % nasal spray PLACE 2 SPRAYS INTO BOTH NOSTRILS 2 (TWO) TIMES DAILY 30 mL 1  . BIOTIN PO Take 200 mg by mouth daily.     . budesonide-formoterol (SYMBICORT) 80-4.5 MCG/ACT inhaler Inhale 2 puffs into the lungs  2 (two) times daily. 1 each 6  . calcipotriene-betamethasone (TACLONEX) ointment Apply topically daily. 60 g 0  . cetirizine (ZYRTEC) 10 MG tablet Take 10 mg by mouth 2 (two) times daily.    . Cholecalciferol (VITAMIN D3) 10000 units capsule Take 30,000 Units by mouth daily.     . ciclopirox (LOPROX) 0.77 % cream Apply topically 2 (two) times daily. 30 g 0  . clobetasol (TEMOVATE) 0.05 % external solution Mix clobetasol solution with  CeraVe cream Use twice daily to affected areas.Avoid Face, groin and underarm 50 mL 0  . Continuous Blood Gluc Sensor (FREESTYLE LIBRE 14 DAY SENSOR) MISC PLACE INTO SKIN FOR CONTINUOUS CHECK OF BLOOD SUGAR DAILY 2 each 2  . cyanocobalamin (,VITAMIN B-12,) 1000 MCG/ML injection Inject 1 mL (1,000 mcg total) into the muscle every 3 (three) months. 1 mL 0  . desipramine (NORPRAMIN) 25 MG tablet TAKE 1 TABLET BY MOUTH IN THE MORNING 30 tablet 5  . diclofenac sodium (VOLTAREN) 1 % GEL Apply 4 g topically 4 (four) times daily. 500 g 5  . docusate sodium (COLACE) 100 MG capsule Take 1 capsule (100 mg total) by mouth 2 (two) times daily. 60 capsule 0  . DULoxetine (CYMBALTA) 20 MG capsule Take 2 capsules (40 mg total) by mouth daily. 180 capsule 2  . Evolocumab (REPATHA SURECLICK) 287 MG/ML SOAJ Inject 1 Dose into the skin every 14 (fourteen) days. 2 pen 11  . famotidine (PEPCID) 20 MG tablet TAKE 1 TABLET BY MOUTH EVERYDAY AT BEDTIME 90 tablet 3  . fenofibrate (TRICOR) 145 MG tablet Take 1 tablet (145 mg total) by mouth daily. 30 tablet 5  . fluocinonide (LIDEX) 0.05 % external solution Apply 1 application topically 2 (two) times daily as needed. Apply to scalp twice daily as need for itch 180 mL 3  . hydrocortisone 2.5 % cream Apply BID to affected area in underarms and groin prn flares    . hydroquinone 4 % cream 1 APPLICATION TOPICALLY DAILY AS NEEDED FOR BLEMISHES.  3  . hydrOXYzine (VISTARIL) 50 MG capsule TAKE 1 CAPSLE  BY MOUTH AT NOON, 1 AT 6 PM, AND 1 AS NEEDED 270  capsule 2  . lamoTRIgine (LAMICTAL) 150 MG tablet Take 2 tablets (300 mg total) by mouth at bedtime. 180 tablet 1  . levalbuterol (XOPENEX HFA) 45 MCG/ACT inhaler INHALE 1 TO 2 PUFFS BY MOUTH EVERY 6 HOURS AS NEEDED FOR WHEEZE 1 each 0  . levalbuterol (XOPENEX) 1.25 MG/3ML nebulizer solution USE 1 VIAL VIA NEBULIZER EVERY 4 HOURS AS NEEDED FOR WHEEZING OR SHORTNESS OF BREATH 270 mL 1  . Lysine 1000 MG TABS Take 2,000-4,000 mg by mouth at bedtime. 2000 mg scheduled at bedtime and patient will take 4000 mg if she has outbreak    . Magnesium 500 MG TABS Take 1 tablet by mouth at bedtime.    . methocarbamol (ROBAXIN) 500 MG tablet Take 1-1.5 tablets (500-750 mg total) by mouth daily as needed for muscle spasms. 135 tablet 0  . metoprolol succinate (TOPROL-XL) 50 MG 24 hr tablet Take 1 tablet (50 mg total) by mouth daily. Take with or immediately following a meal. 30 tablet 5  . neomycin-bacitracin-polymyxin (NEOSPORIN) [phone] ointment Apply 1 application topically 4 (four) times daily as needed (for cut/scrapes.).    Marland Kitchen Olopatadine HCl 0.2 % SOLN 1 drop once daily    . pantoprazole (PROTONIX) 40 MG tablet Take 1 tablet (40 mg total) by mouth 2 (two) times daily. 180 tablet 1  . phenazopyridine (PYRIDIUM) 95 MG tablet Take 95 mg by mouth 3 (three) times daily as needed for pain.    . polyethylene glycol powder (GLYCOLAX/MIRALAX) powder MIX 17 GRAMS (1 CAPFUL) WITH 4-8 OZ OF LIQUID AND TAKE BY MOUTH TWICE DAILY AS NEEDED 527 g 0  . temazepam (RESTORIL) 30 MG capsule TAKE ONE TO TWO CAPSULES BY MOUTH NIGHTLY AT BEDTIME 60 capsule 4  . traZODone (DESYREL) 50 MG tablet Take 2 tablets (100 mg total) by mouth at bedtime. 60 tablet 7  . valACYclovir (VALTREX) 1000 MG tablet Take 1 tablet (1,000 mg total) by mouth 2 (two) times daily. 10 tablet 0   No current facility-administered medications on file prior to visit.    BP 118/62   Pulse (!) 110   Temp (!) 97.1 F (36.2 C) (Temporal)   Ht 5\' 9"  (1.753  m)   Wt 249 lb (112.9 kg)   LMP 08/23/2014   SpO2 97%   BMI 36.77 kg/m  ' Objective:   Physical Exam Cardiovascular:     Rate and Rhythm: Normal rate and regular rhythm.  Pulmonary:     Effort: Pulmonary effort is normal.     Breath sounds: Normal breath sounds.  Musculoskeletal:     Comments: Generalized decrease in range of motion to lower back which is a chronic issue, ambulatory in clinic with cane hunched forward, unable to stand completely erect given pain.  Neurological:     Mental Status: She is alert.            Assessment & Plan:

## 2020-12-10 NOTE — Assessment & Plan Note (Signed)
Following with neurosurgery. She is in obvious discomfort today, ambulates with cane, unable to stand erect due to pain.  Agree to provide walk-in shower accomodation.

## 2020-12-10 NOTE — Assessment & Plan Note (Signed)
Well controlled with A1C today 6.5. Continue Trulicity 5.10 mg weekly.   Encouraged her to continue to work on her diet, also encouraged her to start some exercise.  We will see her back later this spring for follow-up.

## 2020-12-10 NOTE — Patient Instructions (Addendum)
Continue to work on [employer] as discussed.  Continue Trulicity 3.53 mg weekly.   We will have your form ready for pick up Friday this week.  Schedule your physical for this Summer.   It was a pleasure to see you today!

## 2020-12-12 DIAGNOSIS — M25651 Stiffness of right hip, not elsewhere classified: Secondary | ICD-10-CM | POA: Diagnosis not present

## 2020-12-12 DIAGNOSIS — M25551 Pain in right hip: Secondary | ICD-10-CM | POA: Diagnosis not present

## 2020-12-12 DIAGNOSIS — M6281 Muscle weakness (generalized): Secondary | ICD-10-CM | POA: Diagnosis not present

## 2020-12-12 DIAGNOSIS — G8929 Other chronic pain: Secondary | ICD-10-CM | POA: Diagnosis not present

## 2020-12-17 DIAGNOSIS — G8929 Other chronic pain: Secondary | ICD-10-CM | POA: Diagnosis not present

## 2020-12-17 DIAGNOSIS — M25551 Pain in right hip: Secondary | ICD-10-CM | POA: Diagnosis not present

## 2020-12-21 ENCOUNTER — Other Ambulatory Visit: Payer: Self-pay | Admitting: Internal Medicine

## 2020-12-21 DIAGNOSIS — M2550 Pain in unspecified joint: Secondary | ICD-10-CM

## 2020-12-21 DIAGNOSIS — G8929 Other chronic pain: Secondary | ICD-10-CM

## 2020-12-21 DIAGNOSIS — M545 Low back pain, unspecified: Secondary | ICD-10-CM

## 2020-12-21 DIAGNOSIS — M159 Polyosteoarthritis, unspecified: Secondary | ICD-10-CM

## 2020-12-23 ENCOUNTER — Other Ambulatory Visit: Payer: Self-pay | Admitting: Primary Care

## 2020-12-23 DIAGNOSIS — M159 Polyosteoarthritis, unspecified: Secondary | ICD-10-CM

## 2020-12-23 DIAGNOSIS — G8929 Other chronic pain: Secondary | ICD-10-CM

## 2020-12-23 DIAGNOSIS — M545 Low back pain, unspecified: Secondary | ICD-10-CM

## 2020-12-23 DIAGNOSIS — M2550 Pain in unspecified joint: Secondary | ICD-10-CM

## 2020-12-23 MED ORDER — METHOCARBAMOL 500 MG PO TABS: 500.0000 mg | ORAL_TABLET | Freq: Every day | ORAL | 0 refills | Status: DC | PRN

## 2020-12-26 DIAGNOSIS — G8929 Other chronic pain: Secondary | ICD-10-CM | POA: Diagnosis not present

## 2020-12-26 DIAGNOSIS — M25551 Pain in right hip: Secondary | ICD-10-CM | POA: Diagnosis not present

## 2020-12-26 DIAGNOSIS — G4733 Obstructive sleep apnea (adult) (pediatric): Secondary | ICD-10-CM | POA: Diagnosis not present

## 2020-12-26 NOTE — Telephone Encounter (Signed)
Anita, please advise. Thanks 

## 2020-12-27 NOTE — Telephone Encounter (Signed)
I have spoke with Mrs. [place] and explained that I would need the claim # and any codes on the denial letter before I could check into the denial of her Cpap

## 2020-12-27 NOTE — Telephone Encounter (Signed)
I sent a CM to [employer] yesterday and Melissa with [employer] called me back today. Melissa stated that they resubmitted the claim for the Cpap and also sent notes and the compliance report showing using cpap.  Cpap was setup 09/2020. [employer] has suspended her account until this gets resolved. If Emily Moon gets a bill or notice to disregard and she will need to keep her appt on 01/10/21 with Tammy Parrett

## 2020-12-28 DIAGNOSIS — G4733 Obstructive sleep apnea (adult) (pediatric): Secondary | ICD-10-CM | POA: Diagnosis not present

## 2021-01-02 DIAGNOSIS — M25551 Pain in right hip: Secondary | ICD-10-CM | POA: Diagnosis not present

## 2021-01-02 DIAGNOSIS — G8929 Other chronic pain: Secondary | ICD-10-CM | POA: Diagnosis not present

## 2021-01-07 DIAGNOSIS — G8929 Other chronic pain: Secondary | ICD-10-CM | POA: Diagnosis not present

## 2021-01-07 DIAGNOSIS — M25551 Pain in right hip: Secondary | ICD-10-CM | POA: Diagnosis not present

## 2021-01-10 ENCOUNTER — Ambulatory Visit (INDEPENDENT_AMBULATORY_CARE_PROVIDER_SITE_OTHER): Payer: PPO | Admitting: Adult Health

## 2021-01-10 ENCOUNTER — Other Ambulatory Visit: Payer: Self-pay

## 2021-01-10 ENCOUNTER — Encounter: Payer: Self-pay | Admitting: Adult Health

## 2021-01-10 DIAGNOSIS — J4530 Mild persistent asthma, uncomplicated: Secondary | ICD-10-CM | POA: Diagnosis not present

## 2021-01-10 DIAGNOSIS — G4733 Obstructive sleep apnea (adult) (pediatric): Secondary | ICD-10-CM | POA: Diagnosis not present

## 2021-01-10 NOTE — Assessment & Plan Note (Signed)
Excellent control and compliance   Plan  Patient Instructions  Take Symbicort 2 puffs Twice daily  , rinse after use.  May use Xopenex inhaler or nebulizer as needed Continue on CPAP at bedtime  Keep up the good work  Work on [employer]  Do not drive if sleeping  Follow up with Dr. Mortimer Fries in 6 months and As needed   Please contact office for sooner follow up if symptoms do not improve or worsen or seek emergency care

## 2021-01-10 NOTE — Assessment & Plan Note (Signed)
Improved control and tolerance with Symbicort , have asked her to use twice daily   Plan  Patient Instructions  Take Symbicort 2 puffs Twice daily  , rinse after use.  May use Xopenex inhaler or nebulizer as needed Continue on CPAP at bedtime  Keep up the good work  Work on [employer]  Do not drive if sleeping  Follow up with Dr. Mortimer Fries in 6 months and As needed   Please contact office for sooner follow up if symptoms do not improve or worsen or seek emergency care

## 2021-01-10 NOTE — Progress Notes (Signed)
@Patient  ID: Emily Moon, female    DOB: [birth year], 60 y.o.   MRN: [id number]  Chief Complaint  Patient presents with  . Follow-up    Referring provider: [name], NP  HPI: 60 year old female former smoker followed for obstructive sleep apnea and COPD with asthma  TEST/EVENTS :  pulmonary function testing that was done April 29, 2020 that showed moderate restriction and airflow obstruction with FEV1 at 63%, ratio 72, FVC 68%, positive bronchodilator response.  DLCO 116%.  Chest x-ray in April was clear.  06/2020 CPAP ttiration showed CPAP 14 cm with med full fac emask (no need for oxygen or BIPAP )   01/10/2021 Follow up : COPD/Asthma and OSA  Patient returns for a 35-month follow-up.  Patient has underlying obstructive sleep apnea.  He is on nocturnal CPAP.  Patient says she wears her CPAP each night.  She denies any known issues.  Feels rested with no significant daytime sleepiness.  CPAP download shows excellent compliance with 100% usage.  Daily average usage of 5 hours.  Patient is on auto CPAP 10 to 20 cm H2O.  AHI is 1.2.  Positive mask leaks. Wants a different size mask, going for mask fitting next week.   Patient is a former smoker.  Has moderate restriction and bronchodilator reversibility on PFTs.  Minimum airflow obstruction.  She has multiple drug intolerances.  Last visit patient was recommended to begin Breo Changed to Symbicort, says works better but wears off in evening. Only taking once daily . Discussed taking Twice daily  .  Does use xopenex inhaler most days .      Allergies  Allergen Reactions  . Albuterol Shortness Of Breath and Other (See Comments)    Makes pt feel jittery/ tacycardic  . Crestor [Rosuvastatin] Other (See Comments)    Joint pain, muscle pain, and hair loss  . Halcion [Triazolam] Other (See Comments)    Dizziness,headaches,bladder problems  . Levaquin [Levofloxacin In D5w] Diarrhea and Itching    Shoulder pain  . Naproxen  Sodium Swelling    Patient tolerates in small doses  . Tylenol [Acetaminophen] Swelling    Patient tolerates in small doses  . Cefaclor Other (See Comments)    Doesn't remember---unsure if actually allergic   . Diclofenac Sodium Other (See Comments)    "made very sick"  . Sulfa Antibiotics Itching    Unsure of reaction possibly itching Unsure of reaction possibly itching  . Tramadol Itching and Nausea And Vomiting  . Zinc     constipation   . Aripiprazole Other (See Comments)    Muscle tension/cramping  . Diclofenac Sodium Rash    "made very sick"  . Ibuprofen Swelling    Patient tolerates in small doses    Immunization History  Administered Date(s) Administered  . Influenza Split 08/05/2011  . Influenza, Seasonal, Injecte, Preservative Fre 06/26/2018  . Influenza,inj,Quad PF,6+ Mos 08/17/2013, 07/13/2015, 07/06/2017, 06/26/2018, 06/26/2019, 07/04/2020  . Influenza-Unspecified 07/08/2016, 06/18/2017, 06/26/2018  . PFIZER(Purple Top)SARS-COV-2 Vaccination 02/06/2020, 02/28/2020, 07/01/2020  . Pneumococcal Conjugate-13 07/05/2014  . Pneumococcal Polysaccharide-23 07/13/2015    Past Medical History:  Diagnosis Date  . Arthritis    osteo  . Asthma   . Bipolar disorder ([place]) 05/21/14  . Cataracts, bilateral   . DDD (degenerative disc disease), cervical    also back  . Depression   . Dizziness    Positional  . Edema    feet/legs  . Fibromyalgia syndrome   . Fungal infection  Finger nails  . GERD (gastroesophageal reflux disease)   . Gout   . Headache    seasonal allergies  . Heart palpitations   . Hip dysplasia, congenital 09/15/2013  . Hypercholesterolemia   . Multiple sclerosis (HCC)    weakness  . Nephrolithiasis    kidney stones  . Osteoporosis    osteoarthritis  . Pneumonia   . PONV (postoperative nausea and vomiting)    no problem after cataract surgery  . Psoriasis   . Renal stone   . Shortness of breath dyspnea    wheezing  . Sleep apnea 2012    sleep study / slight, no interventions  . Urinary frequency     Tobacco History: Social History   Tobacco Use  Smoking Status Former Smoker  . Packs/day: 1.00  . Years: 25.00  . Pack years: 25.00  . Types: Cigarettes  . Quit date: 2019  . Years since quitting: 3.1  Smokeless Tobacco Never Used  Tobacco Comment   occasional use   Counseling given: Not Answered Comment: occasional use   Outpatient Medications Prior to Visit  Medication Sig Dispense Refill  . ASPERCREME LIDOCAINE EX Apply 1 application topically 4 (four) times daily as needed (for pain.).     Marland Kitchen ASPIRIN 81 PO Take by mouth daily.    Marland Kitchen azelastine (ASTELIN) 0.1 % nasal spray PLACE 2 SPRAYS INTO BOTH NOSTRILS 2 (TWO) TIMES DAILY 30 mL 1  . BIOTIN PO Take 200 mg by mouth daily.     . budesonide-formoterol (SYMBICORT) 80-4.5 MCG/ACT inhaler Inhale 2 puffs into the lungs 2 (two) times daily. 1 each 6  . calcipotriene-betamethasone (TACLONEX) ointment Apply topically daily. 60 g 0  . cetirizine (ZYRTEC) 10 MG tablet Take 10 mg by mouth 2 (two) times daily.    . Cholecalciferol (VITAMIN D3) 10000 units capsule Take 30,000 Units by mouth daily.     . ciclopirox (LOPROX) 0.77 % cream Apply topically 2 (two) times daily. 30 g 0  . clobetasol (TEMOVATE) 0.05 % external solution Mix clobetasol solution with  CeraVe cream Use twice daily to affected areas.Avoid Face, groin and underarm 50 mL 0  . Continuous Blood Gluc Sensor (FREESTYLE LIBRE 14 DAY SENSOR) MISC PLACE INTO SKIN FOR CONTINUOUS CHECK OF BLOOD SUGAR DAILY 2 each 2  . desipramine (NORPRAMIN) 25 MG tablet TAKE 1 TABLET BY MOUTH IN THE MORNING 30 tablet 5  . diclofenac sodium (VOLTAREN) 1 % GEL Apply 4 g topically 4 (four) times daily. 500 g 5  . docusate sodium (COLACE) 100 MG capsule Take 1 capsule (100 mg total) by mouth 2 (two) times daily. 60 capsule 0  . Dulaglutide (TRULICITY) 7.34 LP/3.7TK SOPN Inject 0.75 mg into the skin once a week. For diabetes. 6 mL 3   . DULoxetine (CYMBALTA) 20 MG capsule Take 2 capsules (40 mg total) by mouth daily. 180 capsule 2  . Evolocumab (REPATHA SURECLICK) 240 MG/ML SOAJ Inject 1 Dose into the skin every 14 (fourteen) days. 2 pen 11  . famotidine (PEPCID) 20 MG tablet TAKE 1 TABLET BY MOUTH EVERYDAY AT BEDTIME 90 tablet 3  . fenofibrate (TRICOR) 145 MG tablet Take 1 tablet (145 mg total) by mouth daily. 30 tablet 5  . fluocinonide (LIDEX) 0.05 % external solution Apply 1 application topically 2 (two) times daily as needed. Apply to scalp twice daily as need for itch 180 mL 3  . hydrocortisone 2.5 % cream Apply BID to affected area in underarms and groin prn flares    .  hydroquinone 4 % cream 1 APPLICATION TOPICALLY DAILY AS NEEDED FOR BLEMISHES.  3  . hydrOXYzine (VISTARIL) 50 MG capsule TAKE 1 CAPSLE BY MOUTH AT NOON, 1 AT 6 PM, AND 1 AS NEEDED 270 capsule 2  . lamoTRIgine (LAMICTAL) 150 MG tablet Take 2 tablets (300 mg total) by mouth at bedtime. 180 tablet 1  . levalbuterol (XOPENEX HFA) 45 MCG/ACT inhaler INHALE 1 TO 2 PUFFS BY MOUTH EVERY 6 HOURS AS NEEDED FOR WHEEZE 1 each 0  . levalbuterol (XOPENEX) 1.25 MG/3ML nebulizer solution USE 1 VIAL VIA NEBULIZER EVERY 4 HOURS AS NEEDED FOR WHEEZING OR SHORTNESS OF BREATH 270 mL 1  . Lysine 1000 MG TABS Take 2,000-4,000 mg by mouth at bedtime. 2000 mg scheduled at bedtime and patient will take 4000 mg if she has outbreak    . Magnesium 500 MG TABS Take 1 tablet by mouth at bedtime.    . methocarbamol (ROBAXIN) 500 MG tablet Take 1-2 tablets (500-1,000 mg total) by mouth daily as needed for muscle spasms. 180 tablet 0  . metoprolol succinate (TOPROL-XL) 50 MG 24 hr tablet Take 1 tablet (50 mg total) by mouth daily. Take with or immediately following a meal. 30 tablet 5  . neomycin-bacitracin-polymyxin (NEOSPORIN) [phone] ointment Apply 1 application topically 4 (four) times daily as needed (for cut/scrapes.).    Marland Kitchen Olopatadine HCl 0.2 % SOLN 1 drop once daily    .  pantoprazole (PROTONIX) 40 MG tablet Take 1 tablet (40 mg total) by mouth 2 (two) times daily. 180 tablet 1  . phenazopyridine (PYRIDIUM) 95 MG tablet Take 95 mg by mouth 3 (three) times daily as needed for pain.    . polyethylene glycol powder (GLYCOLAX/MIRALAX) powder MIX 17 GRAMS (1 CAPFUL) WITH 4-8 OZ OF LIQUID AND TAKE BY MOUTH TWICE DAILY AS NEEDED 527 g 0  . temazepam (RESTORIL) 30 MG capsule TAKE ONE TO TWO CAPSULES BY MOUTH NIGHTLY AT BEDTIME 60 capsule 4  . traZODone (DESYREL) 50 MG tablet Take 2 tablets (100 mg total) by mouth at bedtime. 60 tablet 7  . valACYclovir (VALTREX) 1000 MG tablet Take 1 tablet (1,000 mg total) by mouth 2 (two) times daily. 10 tablet 0  . cyanocobalamin (,VITAMIN B-12,) 1000 MCG/ML injection Inject 1 mL (1,000 mcg total) into the muscle every 3 (three) months. (Patient not taking: Reported on 01/10/2021) 1 mL 0   No facility-administered medications prior to visit.     Review of Systems:   Constitutional:   No  weight loss, night sweats,  Fevers, chills,  +fatigue, or  lassitude.  HEENT:   No headaches,  Difficulty swallowing,  Tooth/dental problems, or  Sore throat,                No sneezing, itching, ear ache, nasal congestion, post nasal drip,   CV:  No chest pain,  Orthopnea, PND, swelling in lower extremities, anasarca, dizziness, palpitations, syncope.   GI  No heartburn, indigestion, abdominal pain, nausea, vomiting, diarrhea, change in bowel habits, loss of appetite, bloody stools.   Resp: .  No chest wall deformity  Skin: no rash or lesions.  GU: no dysuria, change in color of urine, no urgency or frequency.  No flank pain, no hematuria   MS:  +knee and back pain    Physical Exam  BP 134/76 (BP Location: Left Arm, Cuff Size: Normal)   Pulse 94   Temp (!) 97.1 F (36.2 C) (Temporal)   Ht 5\' 9"  (1.753 m)  Wt 247 lb 3.2 oz (112.1 kg)   LMP 08/23/2014   SpO2 98%   BMI 36.51 kg/m   GEN: A/Ox3; pleasant , NAD, well nourished     HEENT:  [place]/AT,   NOSE-clear, THROAT-clear, no lesions, no postnasal drip or exudate noted.   NECK:  Supple w/ fair ROM; no JVD; normal carotid impulses w/o bruits; no thyromegaly or nodules palpated; no lymphadenopathy.    RESP  Clear  P & A; w/o, wheezes/ rales/ or rhonchi. no accessory muscle use, no dullness to percussion  CARD:  RRR, no m/r/g, no peripheral edema, pulses intact, no cyanosis or clubbing.  GI:   Soft & nt; nml bowel sounds; no organomegaly or masses detected.   Musco: Warm bil, no deformities or joint swelling noted.   Neuro: alert, no focal deficits noted.    Skin: Warm, no lesions or rashes    Imaging: No results found.    No flowsheet data found.  No results found for: NITRICOXIDE      Assessment & Plan:   OSA (obstructive sleep apnea) Excellent control and compliance   Plan  Patient Instructions  Take Symbicort 2 puffs Twice daily  , rinse after use.  May use Xopenex inhaler or nebulizer as needed Continue on CPAP at bedtime  Keep up the good work  Work on [employer]  Do not drive if sleeping  Follow up with Dr. Mortimer Fries in 6 months and As needed   Please contact office for sooner follow up if symptoms do not improve or worsen or seek emergency care       Asthma Improved control and tolerance with Symbicort , have asked her to use twice daily   Plan  Patient Instructions  Take Symbicort 2 puffs Twice daily  , rinse after use.  May use Xopenex inhaler or nebulizer as needed Continue on CPAP at bedtime  Keep up the good work  Work on [employer]  Do not drive if sleeping  Follow up with Dr. Mortimer Fries in 6 months and As needed   Please contact office for sooner follow up if symptoms do not improve or worsen or seek emergency care       Obesity Healthy weight loss      Rexene Edison, NP 01/10/2021

## 2021-01-10 NOTE — Patient Instructions (Addendum)
Take Symbicort 2 puffs Twice daily  , rinse after use.  May use Xopenex inhaler or nebulizer as needed Continue on CPAP at bedtime  Keep up the good work  Work on [employer]  Do not drive if sleeping  Follow up with Dr. Mortimer Fries in 6 months and As needed   Please contact office for sooner follow up if symptoms do not improve or worsen or seek emergency care

## 2021-01-10 NOTE — Assessment & Plan Note (Signed)
Healthy weight loss 

## 2021-01-14 ENCOUNTER — Other Ambulatory Visit: Payer: Self-pay | Admitting: Primary Care

## 2021-01-14 DIAGNOSIS — R7303 Prediabetes: Secondary | ICD-10-CM

## 2021-01-16 DIAGNOSIS — M25551 Pain in right hip: Secondary | ICD-10-CM | POA: Diagnosis not present

## 2021-01-16 DIAGNOSIS — G8929 Other chronic pain: Secondary | ICD-10-CM | POA: Diagnosis not present

## 2021-01-20 ENCOUNTER — Other Ambulatory Visit: Payer: Self-pay | Admitting: Dermatology

## 2021-01-20 DIAGNOSIS — K1379 Other lesions of oral mucosa: Secondary | ICD-10-CM

## 2021-01-21 DIAGNOSIS — G8929 Other chronic pain: Secondary | ICD-10-CM | POA: Diagnosis not present

## 2021-01-21 DIAGNOSIS — M25551 Pain in right hip: Secondary | ICD-10-CM | POA: Diagnosis not present

## 2021-01-21 MED ORDER — VALACYCLOVIR HCL 1 G PO TABS: ORAL_TABLET | ORAL | 0 refills | Status: DC

## 2021-01-23 ENCOUNTER — Other Ambulatory Visit: Payer: Self-pay | Admitting: Internal Medicine

## 2021-01-23 ENCOUNTER — Other Ambulatory Visit: Payer: Self-pay | Admitting: Primary Care

## 2021-01-23 DIAGNOSIS — J3089 Other allergic rhinitis: Secondary | ICD-10-CM

## 2021-01-23 NOTE — Telephone Encounter (Signed)
Rx(s) sent to pharmacy electronically.  

## 2021-01-24 ENCOUNTER — Telehealth: Payer: Self-pay

## 2021-01-24 NOTE — Telephone Encounter (Signed)
Type of forms received: handicap placard  Routed to: [employer] received by Gorden Harms  Individual made aware of 3-5 business day turn around (Y/N): y  Faxed to :   Form location:

## 2021-01-24 NOTE — Telephone Encounter (Signed)
Made in error

## 2021-01-25 DIAGNOSIS — G4733 Obstructive sleep apnea (adult) (pediatric): Secondary | ICD-10-CM | POA: Diagnosis not present

## 2021-01-27 NOTE — Telephone Encounter (Signed)
Form received placed in your folder for review.

## 2021-01-28 DIAGNOSIS — M1611 Unilateral primary osteoarthritis, right hip: Secondary | ICD-10-CM | POA: Diagnosis not present

## 2021-01-29 NOTE — Telephone Encounter (Signed)
Patient called.  She wanted to know if the handicapped placard form is ready for pick up.  She needs it filled out by next month. Please call patient when form is ready for pick up at  [phone].

## 2021-01-29 NOTE — Telephone Encounter (Signed)
Called patient let know ready for pick up at reception. She will have friend pick up Emily Moon)

## 2021-02-03 DIAGNOSIS — G4733 Obstructive sleep apnea (adult) (pediatric): Secondary | ICD-10-CM | POA: Diagnosis not present

## 2021-02-10 DIAGNOSIS — M1611 Unilateral primary osteoarthritis, right hip: Secondary | ICD-10-CM | POA: Diagnosis not present

## 2021-02-12 ENCOUNTER — Other Ambulatory Visit: Payer: Self-pay | Admitting: Internal Medicine

## 2021-02-14 NOTE — Telephone Encounter (Signed)
Rx(s) sent to pharmacy electronically.  

## 2021-02-19 ENCOUNTER — Other Ambulatory Visit: Payer: Self-pay | Admitting: Psychiatry

## 2021-02-19 DIAGNOSIS — F310 Bipolar disorder, current episode hypomanic: Secondary | ICD-10-CM

## 2021-02-21 DIAGNOSIS — M1631 Unilateral osteoarthritis resulting from hip dysplasia, right hip: Secondary | ICD-10-CM

## 2021-02-21 DIAGNOSIS — E119 Type 2 diabetes mellitus without complications: Secondary | ICD-10-CM

## 2021-02-21 DIAGNOSIS — E538 Deficiency of other specified B group vitamins: Secondary | ICD-10-CM

## 2021-02-25 DIAGNOSIS — G4733 Obstructive sleep apnea (adult) (pediatric): Secondary | ICD-10-CM | POA: Diagnosis not present

## 2021-02-26 ENCOUNTER — Ambulatory Visit (HOSPITAL_COMMUNITY): Payer: PPO | Admitting: Psychiatry

## 2021-02-26 ENCOUNTER — Other Ambulatory Visit: Payer: Self-pay

## 2021-02-26 DIAGNOSIS — F3130 Bipolar disorder, current episode depressed, mild or moderate severity, unspecified: Secondary | ICD-10-CM

## 2021-02-26 DIAGNOSIS — F310 Bipolar disorder, current episode hypomanic: Secondary | ICD-10-CM | POA: Diagnosis not present

## 2021-02-26 MED ORDER — LAMOTRIGINE 150 MG PO TABS: 300.0000 mg | ORAL_TABLET | Freq: Every day | ORAL | 1 refills | Status: DC

## 2021-02-26 MED ORDER — HYDROXYZINE PAMOATE 50 MG PO CAPS: ORAL_CAPSULE | ORAL | 2 refills | Status: DC

## 2021-02-26 MED ORDER — DULOXETINE HCL 20 MG PO CPEP: 40.0000 mg | ORAL_CAPSULE | Freq: Every day | ORAL | 2 refills | Status: DC

## 2021-02-26 MED ORDER — TRAZODONE HCL 50 MG PO TABS: 100.0000 mg | ORAL_TABLET | Freq: Every day | ORAL | 7 refills | Status: DC

## 2021-02-26 MED ORDER — DESIPRAMINE HCL 25 MG PO TABS: ORAL_TABLET | ORAL | 7 refills | Status: DC

## 2021-02-26 NOTE — Progress Notes (Signed)
The most painful.  This  Psychiatric Initial Adult Assessment   Patient Identification: Emily Moon MRN:  [id number] Date of Evaluation:  02/26/2021 Referral Source: From the community Chief Complaint: Feels exhausted    Today the patient was seen in person.  The patient actually seems to be at her baseline.  Patient has multiple medical problems.  She has diabetes mellitus with a hemoglobin A1c of about 6.5.  She has hypercholesterolemia.  While she gained a lot of weight actually her weight has been stable for the last few months.  Presently she has MS.  She has 1 good friend named [place].  Biggest issue going on at this time is the patient needs hip replacement.  She degree on the right surgeon.  Today she is seen with pain.  Patient also has spinal stenosis.  Her son is moved back towards the [place] and lives in [place] but she does not seem to have much contact with him.  Patient clearly has erratic sleep.  I think her biggest issue is chronic pain both from her spinal stenosis but more so from it.  Getting her pain under control I think is very important and hopefully she will have surgery as an important treatment interventions soon.  Patient: Emily Moon  Procedure(s) Performed: * No surgery found *  Anesthes  Patient location:   Post pain:   Post assessment:   Last Vitals:  There were no vitals filed for this visit.  Post vital signs:   Level of consciousness:   Complications: . Associated Signs/Symptoms: Depression Symptoms:  hopelessness, (Hypo) Manic Symptoms:   Anxiety Symptoms:   Psychotic Symptoms:   PTSD Symptoms:   Past Psychiatric History: The patient was seen in a psychiatric office and takes multiple medications she's not been recently hospitalized.  Previous Psychotropic Medications: Yes   Substance Abuse History in the last 12 months:  No.  Consequences of Substance Abuse: Negative  Past Medical History:  Past Medical  History:  Diagnosis Date  . Arthritis    osteo  . Asthma   . Bipolar disorder ([place]) 05/21/14  . Cataracts, bilateral   . DDD (degenerative disc disease), cervical    also back  . Depression   . Dizziness    Positional  . Edema    feet/legs  . Fibromyalgia syndrome   . Fungal infection    Finger nails  . GERD (gastroesophageal reflux disease)   . Gout   . Headache    seasonal allergies  . Heart palpitations   . 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 Hypercholesterolemia   . Multiple sclerosis (HCC)    weakness  . Nephrolithiasis    kidney stones  . Osteoporosis    osteoarthritis  . Pneumonia   . PONV (postoperative nausea and vomiting)    no problem after cataract surgery  . Psoriasis   . Renal stone   . Shortness of breath dyspnea    wheezing  . Sleep apnea 2012   sleep study / slight, no interventions  . Urinary frequency     Past Surgical History:  Procedure Laterality Date  . CATARACT EXTRACTION W/PHACO Left 05/21/2015   Procedure: CATARACT EXTRACTION PHACO AND INTRAOCULAR LENS PLACEMENT (IOC);  Surgeon: Birder Robson, MD;  Location: [facility];  Service: Ophthalmology;  Laterality: Left;  [place] 00:35 AP% 22.9 CDE 8.11 fluid pack lot #[id number]  . CATARACT EXTRACTION W/PHACO Right 06/04/2015   Procedure: CATARACT EXTRACTION PHACO AND INTRAOCULAR LENS PLACEMENT (IOC);  Surgeon: Gwyndolyn Saxon  Porfilio, MD;  Location: [facility];  Service: Ophthalmology;  Laterality: Right;  US:00:48 AP%: 10.5 CDE:5.08 Fluid lot #[id number]  . CYSTOSCOPY/URETEROSCOPY/HOLMIUM LASER/STENT PLACEMENT Bilateral 09/22/2016   Procedure: CYSTOSCOPY/URETEROSCOPY/HOLMIUM LASER/STENT PLACEMENT;  Surgeon: Hollice Espy, MD;  Location: [facility];  Service: Urology;  Laterality: Bilateral;  . EYE SURGERY  2015   tissue biopsy  . FOOT SURGERY  2015  . JOINT REPLACEMENT Left 2013   hip replacement  . LITHOTRIPSY    . PTOSIS REPAIR Bilateral 02/18/2016   Procedure: BILATERAL PTOSIS REPAIR UPPER EYELIDS;   Surgeon: Karle Starch, MD;  Location: [place];  Service: Ophthalmology;  Laterality: Bilateral;  LEAVE PT EARLY AM  . thumb surgery Right   . TONSILLECTOMY  1973    Family Psychiatric History:   Family History:  Family History  Problem Relation Age of Onset  . Cancer Father        Abdomen with mastasis  . Cancer Mother   . Heart disease Mother   . Kidney disease Neg Hx   . Bladder Cancer Neg Hx   . Prostate cancer Neg Hx   . Kidney cancer Neg Hx     Social History:   Social History   Socioeconomic History  . Marital status: Divorced    Spouse name: Not on file  . Number of children: 1  . Years of education: Not on file  . Highest education level: Not on file  Occupational History  . 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 Smoking status: Former Smoker    Packs/day: 1.00    Years: 25.00    Pack years: 25.00    Types: Cigarettes    Quit date: 2019    Years since quitting: 3.2  . Smokeless tobacco: Never Used  . Tobacco comment: occasional use  Vaping Use  . Vaping Use: Former  Substance and Sexual Activity  . Alcohol use: No    Alcohol/week: 0.0 standard drinks  . Drug use: No    Types: Methylphenidate  . Sexual activity: Never  Other Topics Concern  . Not on file  Social History Narrative  . Not on file   Social Determinants of Health   Financial Resource Strain: Low Risk   . Difficulty of Paying Living Expenses: Not hard at all  Food Insecurity: No Food Insecurity  . Worried About Charity fundraiser in the Last Year: Never true  . Ran Out of Food in the Last Year: Never true  Transportation Needs: No Transportation Needs  . Lack of Transportation (Medical): No  . Lack of Transportation (Non-Medical): No  Physical Activity: Inactive  . Days of Exercise per Week: 0 days  . Minutes of Exercise per Session: 0 min  Stress: Stress Concern Present  . Feeling of Stress : Very much  Social Connections:  Not on file    Additional Social History:   Allergies:   Allergies  Allergen Reactions  . Albuterol Shortness Of Breath and Other (See Comments)    Makes pt feel jittery/ tacycardic  . Crestor [Rosuvastatin] Other (See Comments)    Joint pain, muscle pain, and hair loss  . Halcion [Triazolam] Other (See Comments)    Dizziness,headaches,bladder problems  . Levaquin [Levofloxacin In D5w] Diarrhea and Itching    Shoulder pain  . Naproxen Sodium Swelling    Patient tolerates in small doses  . Tylenol [Acetaminophen] Swelling    Patient tolerates in small doses  .  Cefaclor Other (See Comments)    Doesn't remember---unsure if actually allergic   . Diclofenac Sodium Other (See Comments)    "made very sick"  . Sulfa Antibiotics Itching    Unsure of reaction possibly itching Unsure of reaction possibly itching  . Tramadol Itching and Nausea And Vomiting  . Zinc     constipation   . Aripiprazole Other (See Comments)    Muscle tension/cramping  . Diclofenac Sodium Rash    "made very sick"  . Ibuprofen Swelling    Patient tolerates in small doses    Metabolic Disorder Labs: Lab Results  Component Value Date   HGBA1C 6.5 (A) 12/10/2020   No results found for: PROLACTIN Lab Results  Component Value Date   CHOL 131 08/01/2020   TRIG 352.0 (H) 08/01/2020   HDL 46.80 08/01/2020   CHOLHDL 3 08/01/2020   VLDL 70.4 (H) 08/01/2020   LDLCALC 223 (H) 11/13/2014     Current Medications: Current Outpatient Medications  Medication Sig Dispense Refill  . ASPERCREME LIDOCAINE EX Apply 1 application topically 4 (four) times daily as needed (for pain.).     Marland Kitchen ASPIRIN 81 PO Take by mouth daily.    Marland Kitchen azelastine (ASTELIN) 0.1 % nasal spray PLACE 2 SPRAYS INTO BOTH NOSTRILS 2 (TWO) TIMES DAILY 30 mL 1  . BIOTIN PO Take 200 mg by mouth daily.     . budesonide-formoterol (SYMBICORT) 80-4.5 MCG/ACT inhaler Inhale 2 puffs into the lungs 2 (two) times daily. 1 each 6  .  calcipotriene-betamethasone (TACLONEX) ointment Apply topically daily. 60 g 0  . cetirizine (ZYRTEC) 10 MG tablet Take 10 mg by mouth 2 (two) times daily.    . Cholecalciferol (VITAMIN D3) 10000 units capsule Take 30,000 Units by mouth daily.     . ciclopirox (LOPROX) 0.77 % cream Apply topically 2 (two) times daily. 30 g 0  . clobetasol (TEMOVATE) 0.05 % external solution Mix clobetasol solution with  CeraVe cream Use twice daily to affected areas.Avoid Face, groin and underarm 50 mL 0  . Continuous Blood Gluc Sensor (FREESTYLE LIBRE 14 DAY SENSOR) MISC PLACE INTO SKIN FOR CONTINUOUS CHECK OF BLOOD SUGAR DAILY 2 each 2  . cyanocobalamin (,VITAMIN B-12,) 1000 MCG/ML injection Inject 1 mL (1,000 mcg total) into the muscle every 3 (three) months. (Patient not taking: Reported on 01/10/2021) 1 mL 0  . desipramine (NORPRAMIN) 25 MG tablet TAKE 1 TABLET BY MOUTH IN THE MORNING 30 tablet 7  . diclofenac sodium (VOLTAREN) 1 % GEL Apply 4 g topically 4 (four) times daily. 500 g 5  . docusate sodium (COLACE) 100 MG capsule Take 1 capsule (100 mg total) by mouth 2 (two) times daily. 60 capsule 0  . Dulaglutide (TRULICITY) 2.37 SE/8.3TD SOPN Inject 0.75 mg into the skin once a week. For diabetes. 6 mL 3  . DULoxetine (CYMBALTA) 20 MG capsule Take 2 capsules (40 mg total) by mouth daily. 180 capsule 2  . Evolocumab (REPATHA SURECLICK) 176 MG/ML SOAJ Inject 1 Dose into the skin every 14 (fourteen) days. <PLEASE MAKE APPOINTMENT FOR REFILLS> 6 mL 0  . famotidine (PEPCID) 20 MG tablet TAKE 1 TABLET BY MOUTH EVERYDAY AT BEDTIME 90 tablet 3  . fenofibrate (TRICOR) 145 MG tablet Take 1 tablet (145 mg total) by mouth daily. 30 tablet 5  . fluocinonide (LIDEX) 0.05 % external solution Apply 1 application topically 2 (two) times daily as needed. Apply to scalp twice daily as need for itch 180 mL 3  . hydrocortisone  2.5 % cream Apply BID to affected area in underarms and groin prn flares    . hydroquinone 4 % cream 1  APPLICATION TOPICALLY DAILY AS NEEDED FOR BLEMISHES.  3  . hydrOXYzine (VISTARIL) 50 MG capsule TAKE 1 CAPSLE BY MOUTH AT NOON, 1 AT 6 PM, AND 1 AS NEEDED 270 capsule 2  . lamoTRIgine (LAMICTAL) 150 MG tablet Take 2 tablets (300 mg total) by mouth at bedtime. 180 tablet 1  . levalbuterol (XOPENEX HFA) 45 MCG/ACT inhaler INHALE 1 TO 2 PUFFS BY MOUTH EVERY 6 HOURS AS NEEDED FOR WHEEZE 1 each 0  . levalbuterol (XOPENEX) 1.25 MG/3ML nebulizer solution USE 1 VIAL VIA NEBULIZER EVERY 4 HOURS AS NEEDED FOR WHEEZING OR SHORTNESS OF BREATH 270 mL 1  . Lysine 1000 MG TABS Take 2,000-4,000 mg by mouth at bedtime. 2000 mg scheduled at bedtime and patient will take 4000 mg if she has outbreak    . Magnesium 500 MG TABS Take 1 tablet by mouth at bedtime.    . methocarbamol (ROBAXIN) 500 MG tablet Take 1-2 tablets (500-1,000 mg total) by mouth daily as needed for muscle spasms. 180 tablet 0  . metoprolol succinate (TOPROL-XL) 50 MG 24 hr tablet Take 1 tablet (50 mg total) by mouth daily. Take with or immediately following a meal. 30 tablet 5  . neomycin-bacitracin-polymyxin (NEOSPORIN) [phone] ointment Apply 1 application topically 4 (four) times daily as needed (for cut/scrapes.).    Marland Kitchen Olopatadine HCl 0.2 % SOLN 1 drop once daily    . pantoprazole (PROTONIX) 40 MG tablet Take 1 tablet (40 mg total) by mouth 2 (two) times daily. 180 tablet 1  . phenazopyridine (PYRIDIUM) 95 MG tablet Take 95 mg by mouth 3 (three) times daily as needed for pain.    . polyethylene glycol powder (GLYCOLAX/MIRALAX) powder MIX 17 GRAMS (1 CAPFUL) WITH 4-8 OZ OF LIQUID AND TAKE BY MOUTH TWICE DAILY AS NEEDED 527 g 0  . temazepam (RESTORIL) 30 MG capsule TAKE ONE TO TWO CAPSULES BY MOUTH NIGHTLY AT BEDTIME 60 capsule 4  . traZODone (DESYREL) 50 MG tablet Take 2 tablets (100 mg total) by mouth at bedtime. 60 tablet 7  . valACYclovir (VALTREX) 1000 MG tablet Take 2 tablets by mouth twice daily for 1 day. 4 tablet 0   No current  facility-administered medications for this visit.    Neurologic: Headache: No Seizure: No Paresthesias:No  Musculoskeletal: Strength & Muscle Tone: within normal limits Gait & Station: normal Patient leans: N/A  Psychiatric Specialty Exam: ROS  Last menstrual period 08/23/2014.There is no height or weight on file to calculate BMI.  General Appearance: Casual  Eye Contact:  Good  Speech:  Clear and Coherent  Volume:  Normal  Mood:  Negative  Affect:  Appropriate  Thought Process:  Goal Directed  Orientation:  NA  Thought Content:  Logical  Suicidal Thoughts:  No  Homicidal Thoughts:  No  Memory:  Negative  Judgement:  Good  Insight:  Good  Psychomotor Activity:  Normal  Concentration:    Recall:    Fund of Knowledge:Good  Language: Good  Akathisia:  No  Handed:  Right  AIMS (if indicated):    Assets:  Desire for Improvement  ADL's:  Intact  Cognition: WNL  Sleep:      4/13/20224:10 PM   This patient's first problem is that of major depression.  She will continue taking desipramine, Cymbalta at a moderate dose.  She will continue Lamictal as prescribed.  Her second  problem is insomnia.  The patient will continue taking trazodone and Vistaril for this condition.  Today we recommended that she does get into some type of therapy even if it works well.  This patient is not suicidal.  She seems to function only fairly well.  She will return to see me in 3 to 4 months.

## 2021-03-06 ENCOUNTER — Telehealth: Payer: Self-pay

## 2021-03-06 ENCOUNTER — Other Ambulatory Visit: Payer: Self-pay

## 2021-03-06 ENCOUNTER — Encounter: Payer: Self-pay | Admitting: Podiatry

## 2021-03-06 ENCOUNTER — Ambulatory Visit: Payer: PPO | Admitting: Podiatry

## 2021-03-06 VITALS — BP 125/79 | HR 85

## 2021-03-06 DIAGNOSIS — E119 Type 2 diabetes mellitus without complications: Secondary | ICD-10-CM

## 2021-03-06 NOTE — Progress Notes (Signed)
This patient presents  to my office for at risk foot care.  This patient requires this care by a professional since this patient will be at risk due to having type 2 diabetes.  She presents to the office for diabetic foot exam.  This patient presents for at risk foot care today.  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  General Appearance  Alert, conversant and in no acute stress.  Vascular  Dorsalis pedis  pulses are palpable  Bilaterally.  Posterior tibial pulses are weakly palpable B/L.  Capillary return is within normal limits  bilaterally. Temperature is within normal limits  bilaterally.  Neurologic  Senn-Weinstein monofilament wire test within normal limits  bilaterally. Muscle power within normal limits upon df/pf  B/L  Inv/ev 2/4  B/L.Marland Kitchen  Nails Normal nails noted. . No evidence of bacterial infection or drainage bilaterally.  Orthopedic  No limitations of motion  feet .  No crepitus or effusions noted.  No bony pathology or digital deformities noted.  HAV  Left foot.  Skin  normotropic skin with no porokeratosis noted bilaterally.  No signs of infections or ulcers noted.    Diabetes with no complications.  Consent was obtained for treatment procedures.   Diabetic foot exam performed with no vascular or neurologic pathology.   Return office visit   1 year                  Told patient to return for periodic foot care and evaluation due to potential at risk complications.   Gardiner Barefoot DPM

## 2021-03-10 ENCOUNTER — Telehealth: Payer: Self-pay

## 2021-03-10 ENCOUNTER — Ambulatory Visit (INDEPENDENT_AMBULATORY_CARE_PROVIDER_SITE_OTHER): Payer: PPO | Admitting: Family Medicine

## 2021-03-10 ENCOUNTER — Other Ambulatory Visit: Payer: Self-pay

## 2021-03-10 ENCOUNTER — Encounter: Payer: Self-pay | Admitting: Family Medicine

## 2021-03-10 VITALS — BP 112/64 | HR 96 | Temp 97.2°F | Ht 69.0 in | Wt 247.0 lb

## 2021-03-10 DIAGNOSIS — R42 Dizziness and giddiness: Secondary | ICD-10-CM

## 2021-03-10 DIAGNOSIS — E119 Type 2 diabetes mellitus without complications: Secondary | ICD-10-CM

## 2021-03-10 DIAGNOSIS — E538 Deficiency of other specified B group vitamins: Secondary | ICD-10-CM | POA: Diagnosis not present

## 2021-03-10 DIAGNOSIS — E785 Hyperlipidemia, unspecified: Secondary | ICD-10-CM

## 2021-03-10 MED ORDER — MECLIZINE HCL 25 MG PO TABS: 12.5000 mg | ORAL_TABLET | Freq: Three times a day (TID) | ORAL | 0 refills | Status: DC | PRN

## 2021-03-10 NOTE — Patient Instructions (Signed)
I would try the bedside exercises for vertigo and try meclizine if needed.  Take care.  Glad to see you.

## 2021-03-10 NOTE — Progress Notes (Signed)
This visit occurred during the SARS-CoV-2 public health emergency.  Safety protocols were in place, including screening questions prior to the visit, additional usage of staff PPE, and extensive cleaning of exam room while observing appropriate contact time as indicated for disinfecting solutions.  Detailed conversation with patient.  She is putting up with R hip pain, with ortho f/u pending for now.  Using walker in the meantime.    Due for f/u labs for DM per PCP.  Labs collected at visit.  Was getting up from bed last night, then had room spinning.  Went back into the bed, didn't hit floor.  Then slowly got to bathroom.  Nauseated from the room spinning.  No syncope.  Quick onset.  No paralysis.  This AM got out of bed slowly.  No hearing loss.    Had used delta 8 and delta 9 chronically w/o any sx like this prev.    Meds, vitals, and allergies reviewed.   ROS: Per HPI unless specifically indicated in ROS section   GEN: nad, alert and oriented HEENT: ncat, TM wnl B NECK: supple w/o LA CV: rrr PULM: ctab, no inc wob ABD: soft, +bs EXT: no edema SKIN: no acute rash reproducible sx with turning head to R rapidly.   CN 2-12 wnl B, S/S wnl x4

## 2021-03-10 NOTE — Telephone Encounter (Signed)
Pt said during the night pt got up to get a snack and after 2 steps was very dizzy with room spinning around and sat back on bed; happened again earlier today; no CP or H/A and no SOB that is more than usual (pt has COPD) pt never has dizziness. No other covid symptoms; Pt has appt with Dr Damita Dunnings 03/10/21 at 3:30. UC & ED precautions given and pt voiced understanding.

## 2021-03-11 ENCOUNTER — Ambulatory Visit: Payer: PPO | Admitting: Primary Care

## 2021-03-11 LAB — MICROALBUMIN / CREATININE URINE RATIO
Creatinine,U: 158.3 mg/dL
Microalb Creat Ratio: 0.6 mg/g (ref 0.0–30.0)
Microalb, Ur: 1 mg/dL (ref 0.0–1.9)

## 2021-03-11 LAB — LIPID PANEL
Cholesterol: 159 mg/dL (ref 0–200)
HDL: 39.9 mg/dL (ref >39.00)
LDL Cholesterol: 81 mg/dL (ref 0–99)
NonHDL: 119.09
Total CHOL/HDL Ratio: 4
Triglycerides: 189 mg/dL — ABNORMAL HIGH (ref 0.0–149.0)
VLDL: 37.8 mg/dL (ref 0.0–40.0)

## 2021-03-11 LAB — HEMOGLOBIN A1C: Hgb A1c MFr Bld: 6.9 % — ABNORMAL HIGH (ref 4.6–6.5)

## 2021-03-11 LAB — VITAMIN B12: Vitamin B-12: 292 pg/mL (ref 211–911)

## 2021-03-12 NOTE — Assessment & Plan Note (Signed)
Likely BPV.  Still okay for outpatient follow-up.  Anatomy and pathophysiology discussed with patient.  She can use home exercise program at the bedside and use meclizine as needed and then update [place] as needed.  Rationale discussed with patient with detailed conversation and she agrees with plan. 30 minutes were devoted to patient care in this encounter (this includes time spent reviewing the patient's file/history, interviewing and examining the patient, counseling/reviewing plan with patient).

## 2021-03-12 NOTE — Assessment & Plan Note (Signed)
Labs collected at office visit, routed to PCP.

## 2021-03-14 ENCOUNTER — Other Ambulatory Visit: Payer: Self-pay | Admitting: Primary Care

## 2021-03-14 DIAGNOSIS — K219 Gastro-esophageal reflux disease without esophagitis: Secondary | ICD-10-CM

## 2021-03-21 ENCOUNTER — Other Ambulatory Visit: Payer: Self-pay | Admitting: Primary Care

## 2021-03-21 DIAGNOSIS — G8929 Other chronic pain: Secondary | ICD-10-CM

## 2021-03-21 DIAGNOSIS — M159 Polyosteoarthritis, unspecified: Secondary | ICD-10-CM

## 2021-03-21 DIAGNOSIS — M545 Low back pain, unspecified: Secondary | ICD-10-CM

## 2021-03-26 NOTE — Telephone Encounter (Signed)
Can we get her set up for monthly B12 injections to administer at home? Let me know and I will send in the prescription for the medication and needles.

## 2021-03-27 ENCOUNTER — Ambulatory Visit: Payer: PPO | Admitting: Dermatology

## 2021-03-27 ENCOUNTER — Other Ambulatory Visit: Payer: Self-pay

## 2021-03-27 DIAGNOSIS — L308 Other specified dermatitis: Secondary | ICD-10-CM | POA: Diagnosis not present

## 2021-03-27 DIAGNOSIS — D1800 Hemangioma unspecified site: Secondary | ICD-10-CM

## 2021-03-27 DIAGNOSIS — L821 Other seborrheic keratosis: Secondary | ICD-10-CM

## 2021-03-27 DIAGNOSIS — L739 Follicular disorder, unspecified: Secondary | ICD-10-CM | POA: Diagnosis not present

## 2021-03-27 DIAGNOSIS — G4733 Obstructive sleep apnea (adult) (pediatric): Secondary | ICD-10-CM | POA: Diagnosis not present

## 2021-03-27 MED ORDER — CLOBETASOL PROPIONATE 0.05 % EX FOAM: CUTANEOUS | 0 refills | Status: DC

## 2021-03-27 MED ORDER — CLINDAMYCIN PHOSPHATE 1 % EX SOLN: CUTANEOUS | 1 refills | Status: DC

## 2021-03-27 NOTE — Progress Notes (Signed)
   Follow-Up Visit   Subjective  Emily Moon is a 60 y.o. female who presents for the following: Rash (Patient here today for red spots at legs, back, arms that she believes is psoriasis. She is using taclonex once a day. ).  Patient advises she has been told she does have psoriasis and is going to be tested for psoriatic arthritis.   The following portions of the chart were reviewed this encounter and updated as appropriate:   Tobacco  Allergies  Meds  Problems  Med Hx  Surg Hx  Fam Hx      Review of Systems:  No other skin or systemic complaints except as noted in HPI or Assessment and Plan.  Objective  Well appearing patient in no apparent distress; mood and affect are within normal limits.  A focused examination was performed including scalp, arms, legs, back. Relevant physical exam findings are noted in the Assessment and Plan.  Objective  Scalp: Perifollicular erythematous papules and pustules   Objective  legs, arms, back: Scaly pink patches   Assessment & Plan  Folliculitis Scalp  Start clindamycin solution 1-2 times daily to scalp as needed.   Recommend head and shoulders shampoo pyrithione zinc, suds up and leave on for 10 minutes before rinsing up to 3 times weekly.   Consider switching to Cln shampoo if not improving with Head and Shoulders  Advised this can be a difficult condition to treat. If not improving with clindamycin and pyrithione zinc or hypochlorous acid shampoo, may consider PO doxyxycline, topical minocycline. If itch still persistent, may consider topical steroid.  Ordered Medications: clindamycin (CLEOCIN T) 1 % external solution  Other eczema legs, arms, back  Exam most c/w eczema today  Chronic condition with duration or expected duration over one year. Condition is bothersome to patient. Currently flared.  Atopic dermatitis (eczema) is a chronic, relapsing, pruritic condition that can significantly affect quality of life.  It is often associated with allergic rhinitis and/or asthma and can require treatment with topical medications, phototherapy, or in severe cases a biologic medication called Dupixent in older children and adults.   Start clobetasol 0.05% foam twice daily up to 2 weeks at a time. Avoid applying to face, groin, and axilla. Use as directed. Risk of skin atrophy with long-term use reviewed.   Topical steroids (such as triamcinolone, fluocinolone, fluocinonide, mometasone, clobetasol, halobetasol, betamethasone, hydrocortisone) can cause thinning and lightening of the skin if they are used for too long in the same area. Your physician has selected the right strength medicine for your problem and area affected on the body. Please use your medication only as directed by your physician to prevent side effects.    Ordered Medications: clobetasol (OLUX) 0.05 % topical foam  Seborrheic Keratoses - Stuck-on, waxy, tan-brown papules and/or plaques  - Benign-appearing - Discussed benign etiology and prognosis. - Observe - Call for any changes  Hemangiomas - Red papules - Discussed benign nature - Observe - Call for any changes   Return in about 2 months (around 05/27/2021) for eczema and folliculitis follow up.  Graciella Belton, RMA, am acting as scribe for Forest Gleason, MD .  Documentation: I have reviewed the above documentation for accuracy and completeness, and I agree with the above.  Forest Gleason, MD

## 2021-03-27 NOTE — Patient Instructions (Addendum)
Start clindamycin solution 1-2 times daily to itchy areas at scalp as needed.   Recommend head and shoulders shampoo pyrithione zinc, suds up and leave on for 10 minutes before rinsing up to 3 times weekly.   Start clobetasol 0.05% foam twice daily up to 2 weeks at a time to areas of eczema. Avoid applying to face, groin, and axilla. Use as directed. Risk of skin atrophy with long-term use reviewed.   Topical steroids (such as triamcinolone, fluocinolone, fluocinonide, mometasone, clobetasol, halobetasol, betamethasone, hydrocortisone) can cause thinning and lightening of the skin if they are used for too long in the same area. Your physician has selected the right strength medicine for your problem and area affected on the body. Please use your medication only as directed by your physician to prevent side effects.   Recommend daily broad spectrum sunscreen SPF 30+ to sun-exposed areas, reapply every 2 hours as needed. Call for new or changing lesions.  Staying in the shade or wearing long sleeves, sun glasses (UVA+UVB protection) and wide brim hats (4-inch brim around the entire circumference of the hat) are also recommended for sun protection.   If you have any questions or concerns for your doctor, please call our main line at [phone] and press option 4 to reach your doctor's medical assistant. If no one answers, please leave a voicemail as directed and we will return your call as soon as possible. Messages left after 4 pm will be answered the following business day.   You may also send [place] a message via [place]. We typically respond to MyChart messages within 1-2 business days.  For prescription refills, please ask your pharmacy to contact our office. Our fax number is [phone].  If you have an urgent issue when the clinic is closed that cannot wait until the next business day, you can page your doctor at the number below.    Please note that while we do our best to be available for  urgent issues outside of office hours, we are not available 24/7.   If you have an urgent issue and are unable to reach [place], you may choose to seek medical care at your doctor's office, retail clinic, urgent care center, or emergency room.  If you have a medical emergency, please immediately call 911 or go to the emergency department.  Pager Numbers  - Dr. Nehemiah Massed: [phone]  - Dr. Laurence Ferrari: [phone]  - Dr. Nicole Kindred: [phone]  In the event of inclement weather, please call our main line at [phone] for an update on the status of any delays or closures.  Dermatology Medication Tips: Please keep the boxes that topical medications come in in order to help keep track of the instructions about where and how to use these. Pharmacies typically print the medication instructions only on the boxes and not directly on the medication tubes.   If your medication is too expensive, please contact our office at [phone] option 4 or send [place] a message through [place].   We are unable to tell what your co-pay for medications will be in advance as this is different depending on your insurance coverage. However, we may be able to find a substitute medication at lower cost or fill out paperwork to get insurance to cover a needed medication.   If a prior authorization is required to get your medication covered by your insurance company, please allow [place] 1-2 business days to complete this process.  Drug prices often vary depending on where the prescription is filled  and some pharmacies may offer cheaper prices.  The website www.goodrx.com contains coupons for medications through different pharmacies. The prices here do not account for what the cost may be with help from insurance (it may be cheaper with your insurance), but the website can give you the price if you did not use any insurance.  - You can print the associated coupon and take it with your prescription to the pharmacy.  - You may also  stop by our office during regular business hours and pick up a GoodRx coupon card.  - If you need your prescription sent electronically to a different pharmacy, notify our office through [facility] or by phone at [phone] option 4.

## 2021-03-31 ENCOUNTER — Encounter: Payer: Self-pay | Admitting: Dermatology

## 2021-03-31 ENCOUNTER — Other Ambulatory Visit: Payer: Self-pay | Admitting: Family Medicine

## 2021-03-31 DIAGNOSIS — K219 Gastro-esophageal reflux disease without esophagitis: Secondary | ICD-10-CM

## 2021-04-02 ENCOUNTER — Other Ambulatory Visit: Payer: Self-pay | Admitting: Primary Care

## 2021-04-02 DIAGNOSIS — K1379 Other lesions of oral mucosa: Secondary | ICD-10-CM

## 2021-04-02 DIAGNOSIS — M25551 Pain in right hip: Secondary | ICD-10-CM | POA: Diagnosis not present

## 2021-04-02 MED ORDER — VALACYCLOVIR HCL 1 G PO TABS: ORAL_TABLET | ORAL | 0 refills | Status: DC

## 2021-04-02 NOTE — Telephone Encounter (Signed)
Response to pt

## 2021-04-03 ENCOUNTER — Other Ambulatory Visit: Payer: Self-pay | Admitting: Primary Care

## 2021-04-03 DIAGNOSIS — J3089 Other allergic rhinitis: Secondary | ICD-10-CM

## 2021-04-04 ENCOUNTER — Telehealth: Payer: Self-pay | Admitting: Cardiology

## 2021-04-04 ENCOUNTER — Other Ambulatory Visit: Payer: Self-pay | Admitting: *Deleted

## 2021-04-04 ENCOUNTER — Other Ambulatory Visit: Payer: Self-pay | Admitting: Internal Medicine

## 2021-04-04 MED ORDER — FENOFIBRATE 145 MG PO TABS: 145.0000 mg | ORAL_TABLET | Freq: Every day | ORAL | 0 refills | Status: DC

## 2021-04-04 NOTE — Telephone Encounter (Signed)
S/w the pt and she is agreeable to pre op appt. Pt is trying to have her surgery date moved sooner than 07/02/21, states she is in a lot of pain. Pt has been scheduled to see Marrianne Mood, [facility] 04/18/21 @ 2:30. Pt is grateful for the appt and the help. I will forward clearance notes to [facility] for upcoming appt. Will send note as FYI to surgeon pt has appt 04/18/21.

## 2021-04-04 NOTE — Telephone Encounter (Signed)
   [facility] Pre-operative Risk Assessment    Patient Name: Emily Moon  DOB: 09-Nov-1961  MRN: [id number]   HEARTCARE STAFF: - Please ensure there is not already an duplicate clearance open for this procedure. - Under Visit Info/Reason for Call, type in Other and utilize the format Clearance MM/DD/YY or Clearance TBD. Do not use dashes or single digits. - If request is for dental extraction, please clarify the # of teeth to be extracted.  Request for surgical clearance:  1. What type of surgery is being performed? RIGHT TOTAL HIP ARTHROPLASTY, ANTERIOR  2. When is this surgery scheduled? 07/02/21  3. What type of clearance is required (medical clearance vs. Pharmacy clearance to hold med vs. Both)?  NOT LISTED  4. Are there any medications that need to be held prior to surgery and how long? NOT LISTED  5. Practice name and name of physician performing surgery? [place], DR Gaynelle Arabian  6. What is the office phone number? [phone]   7.   What is the office fax number? [phone]  8.   Anesthesia type (None, local, MAC, general) ? SPINAL   Elissa Hefty 04/04/2021, 2:52 PM  _________________________________________________________________   (provider comments below)

## 2021-04-04 NOTE — Telephone Encounter (Signed)
Primary Cardiologist:Brian Agbor-Etang, MD  Chart reviewed as part of pre-operative protocol coverage. Because of Emily Moon past medical history and time since last visit, he/she will require a follow-up visit in order to better assess preoperative cardiovascular risk.  Pre-op covering staff: - Please schedule appointment and call patient to inform them. - Please contact requesting surgeon's office via preferred method (i.e, phone, fax) to inform them of need for appointment prior to surgery.  If applicable, this message will also be routed to pharmacy pool and/or primary cardiologist for input on holding anticoagulant/antiplatelet agent as requested below so that this information is available at time of patient's appointment.   Emily Pelton, NP  04/04/2021, 3:15 PM

## 2021-04-07 ENCOUNTER — Ambulatory Visit: Payer: PPO

## 2021-04-10 DIAGNOSIS — E119 Type 2 diabetes mellitus without complications: Secondary | ICD-10-CM

## 2021-04-11 DIAGNOSIS — G4733 Obstructive sleep apnea (adult) (pediatric): Secondary | ICD-10-CM | POA: Diagnosis not present

## 2021-04-12 ENCOUNTER — Other Ambulatory Visit: Payer: Self-pay | Admitting: Primary Care

## 2021-04-12 DIAGNOSIS — R7303 Prediabetes: Secondary | ICD-10-CM

## 2021-04-15 MED ORDER — TRULICITY 1.5 MG/0.5ML ~~LOC~~ SOAJ: 1.5000 mg | SUBCUTANEOUS | 2 refills | Status: DC

## 2021-04-18 ENCOUNTER — Encounter: Payer: Self-pay | Admitting: Physician Assistant

## 2021-04-18 ENCOUNTER — Other Ambulatory Visit: Payer: Self-pay

## 2021-04-18 ENCOUNTER — Ambulatory Visit: Payer: PPO | Admitting: Physician Assistant

## 2021-04-18 VITALS — BP 116/60 | HR 105 | Ht 69.0 in | Wt 243.0 lb

## 2021-04-18 DIAGNOSIS — T466X5D Adverse effect of antihyperlipidemic and antiarteriosclerotic drugs, subsequent encounter: Secondary | ICD-10-CM | POA: Diagnosis not present

## 2021-04-18 DIAGNOSIS — E785 Hyperlipidemia, unspecified: Secondary | ICD-10-CM | POA: Diagnosis not present

## 2021-04-18 DIAGNOSIS — R000 Tachycardia, unspecified: Secondary | ICD-10-CM

## 2021-04-18 DIAGNOSIS — E7849 Other hyperlipidemia: Secondary | ICD-10-CM

## 2021-04-18 DIAGNOSIS — M791 Myalgia, unspecified site: Secondary | ICD-10-CM | POA: Diagnosis not present

## 2021-04-18 DIAGNOSIS — T466X5A Adverse effect of antihyperlipidemic and antiarteriosclerotic drugs, initial encounter: Secondary | ICD-10-CM

## 2021-04-18 DIAGNOSIS — I4711 Inappropriate sinus tachycardia, so stated: Secondary | ICD-10-CM

## 2021-04-18 DIAGNOSIS — Z01810 Encounter for preprocedural cardiovascular examination: Secondary | ICD-10-CM | POA: Diagnosis not present

## 2021-04-18 DIAGNOSIS — R0602 Shortness of breath: Secondary | ICD-10-CM | POA: Diagnosis not present

## 2021-04-18 DIAGNOSIS — Z6835 Body mass index (BMI) 35.0-35.9, adult: Secondary | ICD-10-CM | POA: Diagnosis not present

## 2021-04-18 NOTE — Patient Instructions (Signed)
Medication Instructions:  Your physician recommends that you continue on your current medications as directed. Please refer to the Current Medication list given to you today.  *If you need a refill on your cardiac medications before your next appointment, please call your pharmacy*   Lab Work: None ordered If you have labs (blood work) drawn today and your tests are completely normal, you will receive your results only by: Marland Kitchen MyChart Message (if you have MyChart) OR . A paper copy in the mail If you have any lab test that is abnormal or we need to change your treatment, we will call you to review the results.   Testing/Procedures: Your physician has requested that you have an echocardiogram. Echocardiography is a painless test that uses sound waves to create images of your heart. It provides your doctor with information about the size and shape of your heart and how well your heart's chambers and valves are working. This procedure takes approximately one hour. There are no restrictions for this procedure.     Follow-Up: At [facility], you and your health needs are our priority.  As part of our continuing mission to provide you with exceptional heart care, we have created designated Provider Care Teams.  These Care Teams include your primary Cardiologist (physician) and Advanced Practice Providers (APPs -  Physician Assistants and Nurse Practitioners) who all work together to provide you with the care you need, when you need it.  We recommend signing up for the patient portal called "MyChart".  Sign up information is provided on this After Visit Summary.  MyChart is used to connect with patients for Virtual Visits (Telemedicine).  Patients are able to view lab/test results, encounter notes, upcoming appointments, etc.  Non-urgent messages can be sent to your provider as well.   To learn more about what you can do with MyChart, go to [email or web address].    Your next appointment:    Follow up after the echo  The format for your next appointment:   In Person  Provider:   You may see [name] Sable, MD or one of the following Advanced Practice Providers on your designated Care Team:    Murray Hodgkins, NP  Christell Faith, PA-C  Marrianne Mood, PA-C  Cadence Ir[place], [place]  Laurann Montana, NP    Other Instructions N/A

## 2021-04-18 NOTE — Progress Notes (Signed)
Office Visit    Patient Name: Emily Moon Date of Encounter: 04/18/2021  PCP:  [name], NP   [place]  Cardiologist:  [name] Sable, MD  Advanced Practice Provider:  No care team member to display Electrophysiologist:  None  :[id number]}   Chief Complaint    Chief Complaint  Patient presents with  . Other    Pre op clearance. Meds reviewed verbally with patient.     60 y.o. female with history of inappropriate sinus tachycardia, asthma, bipolar, OSA, diabetes, hyperlipidemia, GERD, former tobacco use x15 years, and who presents today for preoperative cardiac evaluation.  Past Medical History    Past Medical History:  Diagnosis Date  . Arthritis    osteo  . Asthma   . Bipolar disorder ([place]) 05/21/14  . Cataracts, bilateral   . DDD (degenerative disc disease), cervical    also back  . Depression   . Dizziness    Positional  . Edema    feet/legs  . Fibromyalgia syndrome   . Fungal infection    Finger nails  . GERD (gastroesophageal reflux disease)   . Gout   . Headache    seasonal allergies  . Heart palpitations   . Hip dysplasia, congenital 09/15/2013  . Hypercholesterolemia   . Multiple sclerosis (HCC)    weakness  . Nephrolithiasis    kidney stones  . Osteoporosis    osteoarthritis  . Pneumonia   . PONV (postoperative nausea and vomiting)    no problem after cataract surgery  . Psoriasis   . Renal stone   . Shortness of breath dyspnea    wheezing  . Sleep apnea 2012   sleep study / slight, no interventions  . Urinary frequency    Past Surgical History:  Procedure Laterality Date  . CATARACT EXTRACTION W/PHACO Left 05/21/2015   Procedure: CATARACT EXTRACTION PHACO AND INTRAOCULAR LENS PLACEMENT (IOC);  Surgeon: Birder Robson, MD;  Location: [facility];  Service: Ophthalmology;  Laterality: Left;  [place] 00:35 AP% 22.9 CDE 8.11 fluid pack lot #[id number]  . CATARACT EXTRACTION W/PHACO Right 06/04/2015    Procedure: CATARACT EXTRACTION PHACO AND INTRAOCULAR LENS PLACEMENT (IOC);  Surgeon: Birder Robson, MD;  Location: [facility];  Service: Ophthalmology;  Laterality: Right;  US:00:48 AP%: 10.5 CDE:5.08 Fluid lot #[id number]  . CYSTOSCOPY/URETEROSCOPY/HOLMIUM LASER/STENT PLACEMENT Bilateral 09/22/2016   Procedure: CYSTOSCOPY/URETEROSCOPY/HOLMIUM LASER/STENT PLACEMENT;  Surgeon: Hollice Espy, MD;  Location: [facility];  Service: Urology;  Laterality: Bilateral;  . EYE SURGERY  2015   tissue biopsy  . FOOT SURGERY  2015  . JOINT REPLACEMENT Left 2013   hip replacement  . LITHOTRIPSY    . PTOSIS REPAIR Bilateral 02/18/2016   Procedure: BILATERAL PTOSIS REPAIR UPPER EYELIDS;  Surgeon: Karle Starch, MD;  Location: [place];  Service: Ophthalmology;  Laterality: Bilateral;  LEAVE PT EARLY AM  . thumb surgery Right   . TONSILLECTOMY  1973    Allergies  Allergies  Allergen Reactions  . Albuterol Shortness Of Breath and Other (See Comments)    Makes pt feel jittery/ tacycardic  . Crestor [Rosuvastatin] Other (See Comments)    Joint pain, muscle pain, and hair loss  . Halcion [Triazolam] Other (See Comments)    Dizziness,headaches,bladder problems  . Levaquin [Levofloxacin In D5w] Diarrhea and Itching    Shoulder pain  . Naproxen Sodium Swelling    Patient tolerates in small doses  . Tylenol [Acetaminophen] Swelling    Patient tolerates in small  doses  . Cefaclor Other (See Comments)    Doesn't remember---unsure if actually allergic   . Sulfa Antibiotics Itching    Unsure of reaction possibly itching  . Tramadol Itching and Nausea And Vomiting  . Zinc     constipation   . Aripiprazole Other (See Comments)    Muscle tension/cramping  . Diclofenac Sodium Rash    "made very sick"  . Ibuprofen Swelling    Patient tolerates in small doses    History of Present Illness    Emily Moon is a 60 y.o. female with PMH as above.  She has history of inappropriate sinus  tachycardia, asthma, bipolar, OSA, diabetes, hyperlipidemia, GERD, tobacco use x15 years, and is seen today for preoperative cardiac evaluation.  When initially diagnosed with inappropriate sinus tachycardia, she was started on Toprol-XL, subsequently increased to Toprol-XL 50 mg daily.  With this increase, she reported improvement in her symptoms.  At her most recent 10/31/2020 visit with her primary cardiologist, she reported her heart rates as improved with baseline resting heart rate 80s to 90s beats per minute.  She also reported tachypalpitations as improved.  She reported her plan to get right hip surgery in January due to hip dysplasia.  She was trying to lose weight.  She reported that she was not eating well or healthy.  It was noted that she had obstructive pulmonary disease and was following with pulmonary medicine for this issue.  In the past, she had been seen by Dr. Wynonia Lawman for hyperlipidemia and Repatha started.  She reported intolerance to statins.  She established with [employer] for better continue.  Today, 04/18/2021, she returns to clinic for preoperative cardiac evaluation.  Her surgery is currently scheduled for 06/2021 with patient report that due to significant pain, she would like to obtain her surgery sooner than scheduled.  This requires her to have an earlier preoperative cardiac evaluation.  During her clinic visit today, she denies any racing heart rate unless drinking caffeine or stressed.  She does report a significant amount of stress recently, and thus palpitations.  She reports many comorbid conditions that are influencing her multiple symptoms today.  She continues to note heart rate will elevate with minimal activity, though rates at rest are controlled in the 80s to 90s.  She reports dyspnea and shortness of breath with consideration of her obstructive pulmonary disease.  She reports myalgias with consideration of her reported diagnosis of MS and multiple other comorbid  conditions.  She also attributes an element of deconditioning and sedentary lifestyle is contributing to her symptoms.  In addition, she feels that anxiety may be contributing.  She reports ongoing unhealthy eating, attributed to her chronic pain and low energy.  She denies any recent syncope or falls.  No report of signs or symptoms consistent with bleeding.  She denies any signs or symptoms consistent with volume overload.  She reports medication compliance.  She reports significant concern over her ongoing racing heart rate.  Home Medications   Current Outpatient Medications  Medication Instructions  . ASPERCREME LIDOCAINE EX 1 application, Topical, 4 times daily PRN  . ASPIRIN 81 PO Oral, Daily  . azelastine (ASTELIN) 0.1 % nasal spray PLACE 2 SPRAYS INTO BOTH NOSTRILS 2 (TWO) TIMES DAILY  . BIOTIN PO 200 mg, Oral, Daily  . budesonide-formoterol (SYMBICORT) 80-4.5 MCG/ACT inhaler 2 puffs, Inhalation, 2 times daily  . calcipotriene-betamethasone (TACLONEX) ointment Topical, Daily  . cetirizine (ZYRTEC) 10 mg, Oral, 2 times daily  . ciclopirox (  LOPROX) 0.77 % cream Topical, 2 times daily  . clindamycin (CLEOCIN T) 1 % external solution Apply 1-2 times daily to scalp as needed for itch.  . clobetasol (OLUX) 0.05 % topical foam Apply to affected areas at arms, legs, trunk twice a day up to 2 weeks. Avoid applying to face, groin, and axilla. Use as directed. Risk of skin atrophy with long-term use reviewed.  . clobetasol (TEMOVATE) 0.05 % external solution Mix clobetasol solution with  CeraVe cream Use twice daily to affected areas.Avoid Face, groin and underarm  . Continuous Blood Gluc Sensor (FREESTYLE LIBRE 14 DAY SENSOR) MISC PLACE INTO SKIN FOR CONTINUOUS CHECK OF BLOOD SUGAR DAILY  . cyanocobalamin ((VITAMIN B-12)) 1,000 mcg, Intramuscular, Every 3 months  . desipramine (NORPRAMIN) 25 MG tablet TAKE 1 TABLET BY MOUTH IN THE MORNING  . diclofenac sodium (VOLTAREN) 4 g, Topical, 4 times daily   . docusate sodium (COLACE) 100 mg, Oral, 2 times daily  . DULoxetine (CYMBALTA) 40 mg, Oral, Daily  . Evolocumab (REPATHA SURECLICK) 578 MG/ML SOAJ 1 Dose, Subcutaneous, Every 14 days  . famotidine (PEPCID) 20 MG tablet TAKE 1 TABLET BY MOUTH EVERYDAY AT BEDTIME  . fenofibrate (TRICOR) 145 mg, Oral, Daily, PLEASE CALL OFFICE TO SCHEDULE APPOINTMENT TO CONTINUE GETTING REFILLS  . fluocinonide (LIDEX) 0.05 % external solution 1 application, Topical, 2 times daily PRN, Apply to scalp twice daily as need for itch  . hydrocortisone 2.5 % cream Apply BID to affected area in underarms and groin prn flares  . hydroquinone 4 % cream 1 APPLICATION TOPICALLY DAILY AS NEEDED FOR BLEMISHES.  . hydrOXYzine (VISTARIL) 50 MG capsule TAKE 1 CAPSLE BY MOUTH AT NOON, 1 AT 6 PM, AND 1 AS NEEDED  . lamoTRIgine (LAMICTAL) 300 mg, Oral, Daily at bedtime  . levalbuterol (XOPENEX HFA) 45 MCG/ACT inhaler INHALE 1 TO 2 PUFFS BY MOUTH EVERY 6 HOURS AS NEEDED FOR WHEEZE  . levalbuterol (XOPENEX) 1.25 MG/3ML nebulizer solution USE 1 VIAL VIA NEBULIZER EVERY 4 HOURS AS NEEDED FOR WHEEZING OR SHORTNESS OF BREATH  . Lysine 2,000-4,000 mg, Oral, Daily at bedtime, 2000 mg scheduled at bedtime and patient will take 4000 mg if she has outbreak  . Magnesium 500 MG TABS 1 tablet, Oral, Daily at bedtime  . meclizine (ANTIVERT) 12.5-25 mg, Oral, 3 times daily PRN  . methocarbamol (ROBAXIN) 500-1,000 mg, Oral, Daily PRN  . metoprolol succinate (TOPROL-XL) 50 mg, Oral, Daily, Take with or immediately following a meal.  . neomycin-bacitracin-polymyxin (NEOSPORIN) [phone] ointment 1 application, Topical, 4 times daily PRN  . Olopatadine HCl 0.2 % SOLN 1 drop once daily  . OVER THE COUNTER MEDICATION Delta 8 and Delta 9 gummies  . pantoprazole (PROTONIX) 40 mg, Oral, 2 times daily, For heartburn  . phenazopyridine (PYRIDIUM) 95 mg, Oral, 3 times daily PRN  . polyethylene glycol powder (GLYCOLAX/MIRALAX) powder MIX 17 GRAMS (1 CAPFUL)  WITH 4-8 OZ OF LIQUID AND TAKE BY MOUTH TWICE DAILY AS NEEDED  . temazepam (RESTORIL) 30 MG capsule TAKE ONE TO TWO CAPSULES BY MOUTH NIGHTLY AT BEDTIME  . traZODone (DESYREL) 100 mg, Oral, Daily at bedtime  . Trulicity 1.5 mg, Subcutaneous, Weekly, For diabetes.  . valACYclovir (VALTREX) 1000 MG tablet Take 2 tablets by mouth twice daily for 1 day for an outbreak.  May use prn outbreak.  . Vitamin D3 30,000 Units, Oral, Daily     Review of Systems    She reports racing heart rate with minimal activity, DOE with minimal activity,  tachypalpitations with stress or caffeine.  She denies CP, pnd, orthopnea, n, v, dizziness, syncope, edema,  or early satiety.  She reports weight gain due to diet/caloric intake with sedentary lifestyle due to inability to be active 2/2 pain.   All other systems reviewed and are otherwise negative except as noted above.  Physical Exam    VS:  BP 116/60 (BP Location: Left Arm, Patient Position: Sitting, Cuff Size: Normal)   Pulse (!) 105   Ht 5\' 9"  (1.753 m)   Wt 243 lb (110.2 kg)   LMP 08/23/2014   SpO2 98%   BMI 35.88 kg/m  , BMI Body mass index is 35.88 kg/m. GEN: Well nourished, well developed, in no acute distress.  Seated in wheelchair next to door.  Facemask in place. HEENT: normal. Neck: Supple, no JVD, carotid bruits, or masses. Cardiac: Tachycardic but regular, no murmurs, rubs, or gallops. No clubbing, cyanosis, edema.  Radials/DP/PT 2+ and equal bilaterally.  Respiratory:  Respirations regular and unlabored, clear to auscultation bilaterally. GI: Soft, nontender, nondistended, BS + x 4. MS: no deformity or atrophy. Skin: warm and dry, no rash. Neuro:  Strength and sensation are intact. Psych: Normal affect.  Accessory Clinical Findings    ECG personally reviewed by me today -sinus tachycardia, 105 bpm, possible LAE, right axis deviation with possible right ventricular hypertrophy, QTC 452 ms- no acute changes.  VITALS Reviewed today   Temp  Readings from Last 3 Encounters:  03/10/21 (!) 97.2 F (36.2 C) (Temporal)  01/10/21 (!) 97.1 F (36.2 C) (Temporal)  12/10/20 (!) 97.1 F (36.2 C) (Temporal)   BP Readings from Last 3 Encounters:  04/18/21 116/60  03/10/21 112/64  03/06/21 125/79   Pulse Readings from Last 3 Encounters:  04/18/21 (!) 105  03/10/21 96  03/06/21 85    Wt Readings from Last 3 Encounters:  04/18/21 243 lb (110.2 kg)  03/10/21 247 lb (112 kg)  01/10/21 247 lb 3.2 oz (112.1 kg)     LABS  reviewed today   Lab Results  Component Value Date   WBC 8.3 02/19/2020   HGB 14.2 02/19/2020   HCT 42.2 02/19/2020   MCV 91.3 02/19/2020   PLT 352 02/19/2020   Lab Results  Component Value Date   CREATININE 0.85 08/28/2020   BUN 23 08/28/2020   NA 139 08/28/2020   K 4.2 08/28/2020   CL 100 08/28/2020   CO2 29 08/28/2020   Lab Results  Component Value Date   ALT 19 02/19/2020   AST 20 02/19/2020   ALKPHOS 75 02/19/2020   BILITOT 0.8 02/19/2020   Lab Results  Component Value Date   CHOL 159 03/10/2021   HDL 39.90 03/10/2021   LDLCALC 81 03/10/2021   LDLDIRECT 53.0 08/01/2020   TRIG 189.0 (H) 03/10/2021   CHOLHDL 4 03/10/2021    Lab Results  Component Value Date   HGBA1C 6.9 (H) 03/10/2021   Lab Results  Component Value Date   TSH 0.902 02/19/2020     STUDIES/PROCEDURES reviewed today   02/2020 CT scoring IMPRESSION: Coronary calcium score of 0.  Assessment & Plan    Preoperative cardiac evaluation  --No history of ischemic heart dz or congestive heart failure. Reports DOE and tachypalpitations. Euvolemic and well compensated on exam. No history of significant arrhythmias.  EKG with ST and no evidence of significant arrhythmia. No history of valvular dz. No significant murmurs on exam. Functional capacity poor with consideration of her current comorbids and level of pain /  lnown sedentary lifestyle and deconditioning. She does use insulin but Cr below 2.0. Surgery specific risk  intermediate at 1-5%. She does have history of pulmonary dz with report of DOE. Will get an echo to ensure she does not also have any underlying acute structural abnormalities, reduced EF, or WMA contributing to her sx. If echo without acute findings, reasonable to proceed with her surgery without additional testing or intervention. Current RCRI score 1 for insulin use and Class II risk with 0.9% risk of MACE. Regarding ASA therapy, we recommend continuation of ASA throughout the perioperative period.  However, if the surgeon feels that cessation of ASA is required in the perioperative period, it may be stopped 5-7 days prior to surgery with a plan to resume it as soon as felt to be feasible from a surgical standpoint in the post-operative period.  Inappropriate sinus tachycardia -- Reports improvement of resting heart rate on beta-blocker.  Her rates continue to elevate with minimal exertion.  Suspect an element of elevated rates 2/2 deconditioning and sedentary lifestyle.  In addition, pain and anxiety likely contributed to her elevated rates.  Discussed increasing Toprol today for rate control, declined by pt. Given her report of dyspnea, we will get an echo to assess EF, wall motion, valvular function, and rule out any acute structural changes.  Now, continue current Toprol 50 mg daily.  Continue to recommend increased activity as tolerated by pain, likely improving after her surgery.  Heart healthy diet recommended.  Hyperlipidemia with statin intolerance -- Previously seen by the lipid clinic with Repatha initiated.  She has also been on fenofibrate.  On review of most recent lipids, total cholesterol 159 with LDL 81 and triglycerides 189.0.  Triglycerides have improved from 352.  LDL below 100.  Continue Repatha and fenofibrate.  BMI 35.0-36.0 -- Continue to recommend lifestyle changes as previously discussed.  Medication changes: None.  Labs ordered: None Studies / Imaging ordered: Echo -->  proceed to surgery if no acute findings on echo Future considerations: Increase Toprol if needed for rate control Disposition: RTC after echo  *Please be aware that the above documentation was completed voice recognition software and may contain dictation errors.       Arvil Chaco, PA-C 04/18/2021

## 2021-04-21 NOTE — Telephone Encounter (Signed)
Nothing new to add. Agree with ER precautions and OV

## 2021-04-21 NOTE — Telephone Encounter (Signed)
Called patient states that bleeding came to stop about 45 minutes after bowel movement. She has not had any bowel movent after. Denies any more bleeding vaginal or rectal. Was concerned about the clot and that it was a large amount for about 45 minutes. Advised patient if bleeding returns she start to have SOB or increased fatigue to be seen at ED.  If any other changes to give our office a call. Let her know will send to Dr. Einar Pheasant to review and Anda Kraft to review for appointment once she is back in office. She has app on 6/8 in office.

## 2021-04-23 ENCOUNTER — Encounter: Payer: Self-pay | Admitting: Primary Care

## 2021-04-23 ENCOUNTER — Other Ambulatory Visit: Payer: Self-pay

## 2021-04-23 ENCOUNTER — Ambulatory Visit (INDEPENDENT_AMBULATORY_CARE_PROVIDER_SITE_OTHER): Payer: PPO | Admitting: Primary Care

## 2021-04-23 DIAGNOSIS — K1379 Other lesions of oral mucosa: Secondary | ICD-10-CM

## 2021-04-23 DIAGNOSIS — Z01818 Encounter for other preprocedural examination: Secondary | ICD-10-CM

## 2021-04-23 DIAGNOSIS — E119 Type 2 diabetes mellitus without complications: Secondary | ICD-10-CM | POA: Diagnosis not present

## 2021-04-23 DIAGNOSIS — B009 Herpesviral infection, unspecified: Secondary | ICD-10-CM

## 2021-04-23 MED ORDER — VALACYCLOVIR HCL 1 G PO TABS
1000.0000 mg | ORAL_TABLET | Freq: Every day | ORAL | 0 refills | Status: DC
Start: 2021-04-23 — End: 2021-07-05

## 2021-04-23 NOTE — Progress Notes (Signed)
Subjective:    Patient ID: Emily Moon, female    DOB: [birth year], 60 y.o.   MRN: [id number]  HPI  Emily Moon is a very pleasant 60 y.o. female with a history of Asthma, type 2 diabetes, osteoarthritis, multiple sclerosis, chronic pain syndrome, chronic hip pain, Bipolar disorder, who presents today for surgical clearance. She is also due for her pap smear.   She is pending total right arthroplasty per orthopedics, Dr. Wynelle Link. She underwent cardiac clearance per cardiology on 04/18/21, she is pending echocardiogram and if this is stable then will be cleared. There were some concerns noted by the patient including tachycardia, but she declined a dose increase of her Toprol.   Today she endorses recent bouts of hemorrhoids, has been constipated, endorses a poor diet, is not on a stool softener. She's feeling so weak due to her hip pain. She is using a walker now due to instability. She's mostly sedentary at home. She has an aid who comes by the apartment three times weekly to help with cleaning and to stay there while the patient bathes.   She is checking her glucose numerous times daily which ranges 120's. She is compliant to her Trulicity 1.5 mg weekly. She endorses a poor diet, is eating a lot of food delivered by [employer].   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 She was recently treated with Valtrex, she took the entire prescription despite instructions because HSV flared. She would like a continuous dose to prevent recurrent flares.     BP Readings from Last 3 Encounters:  04/23/21 120/74  04/18/21 116/60  03/10/21 112/64   Wt Readings from Last 3 Encounters:  04/23/21 245 lb (111.1 kg)  04/18/21 243 lb (110.2 kg)  03/10/21 247 lb (112 kg)      Review of Systems  Eyes: Negative for visual disturbance.  Respiratory: Negative for shortness of breath.   Cardiovascular: Negative for chest pain.  Musculoskeletal: Positive for  arthralgias.  Neurological: Positive for numbness.         Past Medical History:  Diagnosis Date  . Arthritis    osteo  . Asthma   . Bipolar disorder ([place]) 05/21/14  . Cataracts, bilateral   . DDD (degenerative disc disease), cervical    also back  . Depression   . Dizziness    Positional  . Edema    feet/legs  . Fibromyalgia syndrome   . Fungal infection    Finger nails  . GERD (gastroesophageal reflux disease)   . Gout   . Headache    seasonal allergies  . Heart palpitations   . 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 Hypercholesterolemia   . Multiple sclerosis ([facility])    weakness  . Nephrolithiasis    kidney stones  . Osteoporosis    osteoarthritis  . Pneumonia   . PONV (postoperative nausea and vomiting)    no problem after cataract surgery  . Psoriasis   . Renal stone   . Shortness of breath dyspnea    wheezing  . Sleep apnea 2012   sleep study / slight, no interventions  . Urinary frequency     Social History   Socioeconomic History  . Marital status: Divorced    Spouse name: Not on file  . Number of children: 1  . Years of education: Not on file  . Highest education level: Not on file  Occupational History  . Occupation: Therapist, art Rep at [employer]    Employer: OTHER  Tobacco Use  . Smoking status: Former Smoker    Packs/day: 1.00    Years: 25.00    Pack years: 25.00    Types: Cigarettes    Quit date: 2019    Years since quitting: 3.4  . Smokeless tobacco: Never Used  . Tobacco comment: occasional use  Vaping Use  . Vaping Use: Former  Substance and Sexual Activity  . Alcohol use: No    Alcohol/week: 0.0 standard drinks  . Drug use: No    Types: Methylphenidate  . Sexual activity: Never  Other Topics Concern  . Not on file  Social History Narrative  . Not on file   Social Determinants of Health   Financial Resource Strain: Not on file  Food Insecurity: Not on file  Transportation Needs: Not on file  Physical Activity:  Not on file  Stress: Not on file  Social Connections: Not on file  Intimate Partner Violence: Not on file    Past Surgical History:  Procedure Laterality Date  . CATARACT EXTRACTION W/PHACO Left 05/21/2015   Procedure: CATARACT EXTRACTION PHACO AND INTRAOCULAR LENS PLACEMENT (IOC);  Surgeon: Birder Robson, MD;  Location: [facility];  Service: Ophthalmology;  Laterality: Left;  [place] 00:35 AP% 22.9 CDE 8.11 fluid pack lot #[id number]  . CATARACT EXTRACTION W/PHACO Right 06/04/2015   Procedure: CATARACT EXTRACTION PHACO AND INTRAOCULAR LENS PLACEMENT (IOC);  Surgeon: Birder Robson, MD;  Location: [facility];  Service: Ophthalmology;  Laterality: Right;  US:00:48 AP%: 10.5 CDE:5.08 Fluid lot #[id number]  . CYSTOSCOPY/URETEROSCOPY/HOLMIUM LASER/STENT PLACEMENT Bilateral 09/22/2016   Procedure: CYSTOSCOPY/URETEROSCOPY/HOLMIUM LASER/STENT PLACEMENT;  Surgeon: Hollice Espy, MD;  Location: [facility];  Service: Urology;  Laterality: Bilateral;  . EYE SURGERY  2015   tissue biopsy  . FOOT SURGERY  2015  . JOINT REPLACEMENT Left 2013   hip replacement  . LITHOTRIPSY    . PTOSIS REPAIR Bilateral 02/18/2016   Procedure: BILATERAL PTOSIS REPAIR UPPER EYELIDS;  Surgeon: Karle Starch, MD;  Location: [facility];  Service: Ophthalmology;  Laterality: Bilateral;  LEAVE PT EARLY AM  . thumb surgery Right   . TONSILLECTOMY  1973    Family History  Problem Relation Age of Onset  . Cancer Father        Abdomen with mastasis  . Cancer Mother   . Heart disease Mother   . Kidney disease Neg Hx   . Bladder Cancer Neg Hx   . Prostate cancer Neg Hx   . Kidney cancer Neg Hx     Allergies  Allergen Reactions  . Albuterol Shortness Of Breath and Other (See Comments)    Makes pt feel jittery/ tacycardic  . Crestor [Rosuvastatin] Other (See Comments)    Joint pain, muscle pain, and hair loss  . Halcion [Triazolam] Other (See Comments)    Dizziness,headaches,bladder problems  . Levaquin [Levofloxacin  In D5w] Diarrhea and Itching    Shoulder pain  . Naproxen Sodium Swelling    Patient tolerates in small doses  . Tylenol [Acetaminophen] Swelling    Patient tolerates in small doses  . Cefaclor Other (See Comments)    Doesn't remember---unsure if actually allergic   . Sulfa Antibiotics Itching    Unsure of reaction possibly itching  . Tramadol Itching and Nausea And Vomiting  . Zinc     constipation   . Aripiprazole Other (See Comments)    Muscle tension/cramping  . Diclofenac Sodium Rash    "made very sick"  . Ibuprofen  Swelling    Patient tolerates in small doses    Current Outpatient Medications on File Prior to Visit  Medication Sig Dispense Refill  . ASPERCREME LIDOCAINE EX Apply 1 application topically 4 (four) times daily as needed (for pain.).     Marland Kitchen ASPIRIN 81 PO Take by mouth daily.    Marland Kitchen azelastine (ASTELIN) 0.1 % nasal spray PLACE 2 SPRAYS INTO BOTH NOSTRILS 2 (TWO) TIMES DAILY 30 mL 0  . BIOTIN PO Take 200 mg by mouth daily.     . budesonide-formoterol (SYMBICORT) 80-4.5 MCG/ACT inhaler Inhale 2 puffs into the lungs 2 (two) times daily. 1 each 6  . calcipotriene-betamethasone (TACLONEX) ointment Apply topically daily. 60 g 0  . cetirizine (ZYRTEC) 10 MG tablet Take 10 mg by mouth 2 (two) times daily.    . Cholecalciferol (VITAMIN D3) 10000 units capsule Take 30,000 Units by mouth daily.     . ciclopirox (LOPROX) 0.77 % cream Apply topically 2 (two) times daily. 30 g 0  . clindamycin (CLEOCIN T) 1 % external solution Apply 1-2 times daily to scalp as needed for itch. 60 mL 1  . clobetasol (OLUX) 0.05 % topical foam Apply to affected areas at arms, legs, trunk twice a day up to 2 weeks. Avoid applying to face, groin, and axilla. Use as directed. Risk of skin atrophy with long-term use reviewed. 50 g 0  . clobetasol (TEMOVATE) 0.05 % external solution Mix clobetasol solution with  CeraVe cream Use twice daily to affected areas.Avoid Face, groin and underarm 50 mL 0  .  Continuous Blood Gluc Sensor (FREESTYLE LIBRE 14 DAY SENSOR) MISC PLACE INTO SKIN FOR CONTINUOUS CHECK OF BLOOD SUGAR DAILY 2 each 2  . cyanocobalamin (,VITAMIN B-12,) 1000 MCG/ML injection Inject 1 mL (1,000 mcg total) into the muscle every 3 (three) months. 1 mL 0  . desipramine (NORPRAMIN) 25 MG tablet TAKE 1 TABLET BY MOUTH IN THE MORNING 30 tablet 7  . diclofenac sodium (VOLTAREN) 1 % GEL Apply 4 g topically 4 (four) times daily. 500 g 5  . docusate sodium (COLACE) 100 MG capsule Take 1 capsule (100 mg total) by mouth 2 (two) times daily. 60 capsule 0  . Dulaglutide (TRULICITY) 1.5 QJ/1.9ER SOPN Inject 1.5 mg into the skin once a week. For diabetes. 6 mL 2  . DULoxetine (CYMBALTA) 20 MG capsule Take 2 capsules (40 mg total) by mouth daily. 180 capsule 2  . Evolocumab (REPATHA SURECLICK) 740 MG/ML SOAJ Inject 1 Dose into the skin every 14 (fourteen) days. 2 mL 11  . famotidine (PEPCID) 20 MG tablet TAKE 1 TABLET BY MOUTH EVERYDAY AT BEDTIME 90 tablet 3  . fenofibrate (TRICOR) 145 MG tablet Take 1 tablet (145 mg total) by mouth daily. PLEASE CALL OFFICE TO SCHEDULE APPOINTMENT TO CONTINUE GETTING REFILLS 30 tablet 0  . fluocinonide (LIDEX) 0.05 % external solution Apply 1 application topically 2 (two) times daily as needed. Apply to scalp twice daily as need for itch 180 mL 3  . hydrocortisone 2.5 % cream Apply BID to affected area in underarms and groin prn flares    . hydroquinone 4 % cream 1 APPLICATION TOPICALLY DAILY AS NEEDED FOR BLEMISHES.  3  . hydrOXYzine (VISTARIL) 50 MG capsule TAKE 1 CAPSLE BY MOUTH AT NOON, 1 AT 6 PM, AND 1 AS NEEDED 270 capsule 2  . lamoTRIgine (LAMICTAL) 150 MG tablet Take 2 tablets (300 mg total) by mouth at bedtime. 180 tablet 1  . levalbuterol (XOPENEX HFA) 45  MCG/ACT inhaler INHALE 1 TO 2 PUFFS BY MOUTH EVERY 6 HOURS AS NEEDED FOR WHEEZE 1 each 0  . levalbuterol (XOPENEX) 1.25 MG/3ML nebulizer solution USE 1 VIAL VIA NEBULIZER EVERY 4 HOURS AS NEEDED FOR  WHEEZING OR SHORTNESS OF BREATH 270 mL 1  . Lysine 1000 MG TABS Take 2,000-4,000 mg by mouth at bedtime. 2000 mg scheduled at bedtime and patient will take 4000 mg if she has outbreak    . Magnesium 500 MG TABS Take 1 tablet by mouth at bedtime.    . meclizine (ANTIVERT) 25 MG tablet Take 0.5-1 tablets (12.5-25 mg total) by mouth 3 (three) times daily as needed for dizziness (sedation caution). 30 tablet 0  . methocarbamol (ROBAXIN) 500 MG tablet TAKE 1-2 TABLETS (500-1,000 MG TOTAL) BY MOUTH DAILY AS NEEDED FOR MUSCLE SPASMS. 180 tablet 0  . neomycin-bacitracin-polymyxin (NEOSPORIN) [phone] ointment Apply 1 application topically 4 (four) times daily as needed (for cut/scrapes.).    Marland Kitchen Olopatadine HCl 0.2 % SOLN 1 drop once daily    . OVER THE COUNTER MEDICATION Delta 8 and Delta 9 gummies    . pantoprazole (PROTONIX) 40 MG tablet Take 1 tablet (40 mg total) by mouth 2 (two) times daily. For heartburn 180 tablet 0  . phenazopyridine (PYRIDIUM) 95 MG tablet Take 95 mg by mouth 3 (three) times daily as needed for pain.    . polyethylene glycol powder (GLYCOLAX/MIRALAX) powder MIX 17 GRAMS (1 CAPFUL) WITH 4-8 OZ OF LIQUID AND TAKE BY MOUTH TWICE DAILY AS NEEDED 527 g 0  . temazepam (RESTORIL) 30 MG capsule TAKE ONE TO TWO CAPSULES BY MOUTH NIGHTLY AT BEDTIME 60 capsule 4  . traZODone (DESYREL) 50 MG tablet Take 2 tablets (100 mg total) by mouth at bedtime. 60 tablet 7  . valACYclovir (VALTREX) 1000 MG tablet Take 2 tablets by mouth twice daily for 1 day for an outbreak.  May use prn outbreak. 20 tablet 0  . metoprolol succinate (TOPROL-XL) 50 MG 24 hr tablet Take 1 tablet (50 mg total) by mouth daily. Take with or immediately following a meal. 30 tablet 5   No current facility-administered medications on file prior to visit.    BP 120/74   Pulse (!) 104   Temp (!) 97.2 F (36.2 C) (Temporal)   Ht 5\' 9"  (1.753 m)   Wt 245 lb (111.1 kg)   LMP 08/23/2014   SpO2 95%   BMI 36.18 kg/m   Objective:   Physical Exam Cardiovascular:     Rate and Rhythm: Normal rate and regular rhythm.  Pulmonary:     Effort: Pulmonary effort is normal.     Breath sounds: Normal breath sounds.  Musculoskeletal:     Cervical back: Neck supple.     Comments: Significant reduction in ROM to right hip, poor balance. Uses walker and is stable.    Skin:    General: Skin is warm and dry.           Assessment & Plan:      This visit occurred during the SARS-CoV-2 public health emergency.  Safety protocols were in place, including screening questions prior to the visit, additional usage of staff PPE, and extensive cleaning of exam room while observing appropriate contact time as indicated for disinfecting solutions.

## 2021-04-23 NOTE — Assessment & Plan Note (Signed)
Chronic for years, recurrent flares over the last few months.   Agree to provide Valtrex for continuous treatment.  Rx sent to pharmacy. She will update.

## 2021-04-23 NOTE — Assessment & Plan Note (Addendum)
Evaluated by cardiology last week, echocardiogram pending.  ROS today stable. Diabetes is well controlled with recent A1C of 6.9.  Other labs pending.  Clear from medical standpoint. Recent labs reviewed. Will repeat labs during her CPE in July just prior to surgery.

## 2021-04-23 NOTE — Patient Instructions (Signed)
Start valacyclovir 1000 mg tablets once daily for herpes outbreak prevention.  Continue Trulicity 1.5 mg weekly.  We will see you next month for your physical.  It was a pleasure to see you today!

## 2021-04-23 NOTE — Assessment & Plan Note (Signed)
Slightly worse since last visit but still overall under decent control. Recent A1C of 6.9.   Continue Trulicity 1.5 mg weekly. Strongly encouraged a healthy diet, increase activity when able.

## 2021-04-24 ENCOUNTER — Other Ambulatory Visit: Payer: Self-pay | Admitting: *Deleted

## 2021-04-24 MED ORDER — METOPROLOL SUCCINATE ER 50 MG PO TB24: 50.0000 mg | ORAL_TABLET | Freq: Every day | ORAL | 0 refills | Status: DC

## 2021-04-25 ENCOUNTER — Other Ambulatory Visit: Payer: Self-pay | Admitting: Primary Care

## 2021-04-25 DIAGNOSIS — J3089 Other allergic rhinitis: Secondary | ICD-10-CM

## 2021-04-27 DIAGNOSIS — G4733 Obstructive sleep apnea (adult) (pediatric): Secondary | ICD-10-CM | POA: Diagnosis not present

## 2021-04-30 ENCOUNTER — Ambulatory Visit: Payer: PPO

## 2021-05-06 DIAGNOSIS — G4733 Obstructive sleep apnea (adult) (pediatric): Secondary | ICD-10-CM | POA: Diagnosis not present

## 2021-05-08 ENCOUNTER — Encounter: Payer: Self-pay | Admitting: Family Medicine

## 2021-05-08 ENCOUNTER — Ambulatory Visit (INDEPENDENT_AMBULATORY_CARE_PROVIDER_SITE_OTHER): Payer: PPO | Admitting: Family Medicine

## 2021-05-08 ENCOUNTER — Other Ambulatory Visit: Payer: Self-pay

## 2021-05-08 VITALS — BP 100/70 | HR 94 | Temp 97.2°F | Ht 69.0 in | Wt 246.0 lb

## 2021-05-08 DIAGNOSIS — L409 Psoriasis, unspecified: Secondary | ICD-10-CM

## 2021-05-08 DIAGNOSIS — M2550 Pain in unspecified joint: Secondary | ICD-10-CM

## 2021-05-08 NOTE — Patient Instructions (Signed)
Psoriatrax:  5% coal tar shampoo.

## 2021-05-08 NOTE — Progress Notes (Signed)
Emily Koranda T. Mickeal Daws, MD, [facility] at [facility] [place], [address]  Phone: [phone]  FAX: [place] - 60 y.o. female  MRN [id number]  Date of Birth: [birth year]  Date: 05/08/2021  PCP: [name], NP  Referral: [name], NP  Chief Complaint  Patient presents with  . Psoriatic Arthritis    This visit occurred during the SARS-CoV-2 public health emergency.  Safety protocols were in place, including screening questions prior to the visit, additional usage of staff PPE, and extensive cleaning of exam room while observing appropriate contact time as indicated for disinfecting solutions.   Subjective:   Emily Moon is a 60 y.o. very pleasant female patient with Body mass index is 36.33 kg/m. who presents with the following:  Psoriatic arthritis?  He is going to have a THA - Dr. Ricki Rodriguez.  Shows an olecranon bursitis.   Psoriatic plaques. Lab work-ups have been negative, but has a 10+ year history of psoriasis.  This was diagnosed by previous dermatologist. She denies any sausage digits, significant synovitis of fingers, though these do hurt quite a bit and she has global polyarthralgia and multiple joints. No recent trauma.  Rheumatological Labs:  Lab Results  Component Value Date   ANA NEGATIVE 10/24/2018   RF <14 04/30/2020    Lab Results  Component Value Date   ESRSEDRATE 15 04/30/2020    Lab Results  Component Value Date   CRP 1.3 04/30/2020    Lab Results  Component Value Date   VITAMINB12 292 03/10/2021    Lab Results  Component Value Date   TSH 0.902 02/19/2020     Wt Readings from Last 3 Encounters:  05/08/21 246 lb (111.6 kg)  04/23/21 245 lb (111.1 kg)  04/18/21 243 lb (110.2 kg)      Review of Systems is noted in the HPI, as appropriate   Objective:   BP 100/70   Pulse 94   Temp (!) 97.2 F (36.2 C) (Temporal)   Ht 5\' 9"   (1.753 m)   Wt 246 lb (111.6 kg)   LMP 08/23/2014   SpO2 98%   BMI 36.33 kg/m   GEN: No acute distress; alert,appropriate. PULM: Breathing comfortably in no respiratory distress PSYCH: Normally interactive.   I do not appreciate any synovitis in any of her hand, metacarpal, or wrist joints.  Radiology: No results found.  Assessment and Plan:     ICD-10-CM   1. Polyarthralgia  M25.50 Ambulatory referral to Rheumatology    2. Psoriasis  L40.9 Ambulatory referral to Rheumatology     Polyarthralgia in the setting of long-term psoriasis.  She has had negative rheumatological arthritis, lupus, generalized rheumatological panels.  With psoriatic arthritis, some of this work-up is often negative, and I think in this case it is very reasonable and pertinent to have rheumatological evaluate her for an opinion and any additional testing that they would suggest.  Social: Chronic joint pain is limiting her ability to ambulate and exercise.  Orders Placed This Encounter  Procedures  . Ambulatory referral to Rheumatology    Follow-up: As needed only with me.  Signed,  Emily Deed. Chong January, MD   Outpatient Encounter Medications as of 05/08/2021  Medication Sig  . ASPERCREME LIDOCAINE EX Apply 1 application topically 4 (four) times daily as needed (for pain.).   Marland Kitchen ASPIRIN 81 PO Take by mouth daily.  . Azelastine HCl 137 MCG/SPRAY SOLN PLACE 2  SPRAYS INTO BOTH NOSTRILS 2 (TWO) TIMES DAILY  . BIOTIN PO Take 200 mg by mouth daily.   . budesonide-formoterol (SYMBICORT) 80-4.5 MCG/ACT inhaler Inhale 2 puffs into the lungs 2 (two) times daily.  . calcipotriene-betamethasone (TACLONEX) ointment Apply topically daily.  . cetirizine (ZYRTEC) 10 MG tablet Take 10 mg by mouth 2 (two) times daily.  . Cholecalciferol (VITAMIN D3) 10000 units capsule Take 30,000 Units by mouth daily.   . ciclopirox (LOPROX) 0.77 % cream Apply topically 2 (two) times daily.  . clindamycin (CLEOCIN T) 1 % external  solution Apply 1-2 times daily to scalp as needed for itch.  . clobetasol (OLUX) 0.05 % topical foam Apply to affected areas at arms, legs, trunk twice a day up to 2 weeks. Avoid applying to face, groin, and axilla. Use as directed. Risk of skin atrophy with long-term use reviewed.  . clobetasol (TEMOVATE) 0.05 % external solution Mix clobetasol solution with  CeraVe cream Use twice daily to affected areas.Avoid Face, groin and underarm  . Continuous Blood Gluc Sensor (FREESTYLE LIBRE 14 DAY SENSOR) MISC PLACE INTO SKIN FOR CONTINUOUS CHECK OF BLOOD SUGAR DAILY  . cyanocobalamin (,VITAMIN B-12,) 1000 MCG/ML injection Inject 1 mL (1,000 mcg total) into the muscle every 3 (three) months.  . desipramine (NORPRAMIN) 25 MG tablet TAKE 1 TABLET BY MOUTH IN THE MORNING  . diclofenac sodium (VOLTAREN) 1 % GEL Apply 4 g topically 4 (four) times daily.  Marland Kitchen docusate sodium (COLACE) 100 MG capsule Take 1 capsule (100 mg total) by mouth 2 (two) times daily.  . Dulaglutide (TRULICITY) 1.5 HW/2.9HB SOPN Inject 1.5 mg into the skin once a week. For diabetes.  . DULoxetine (CYMBALTA) 20 MG capsule Take 2 capsules (40 mg total) by mouth daily.  . Evolocumab (REPATHA SURECLICK) 716 MG/ML SOAJ Inject 1 Dose into the skin every 14 (fourteen) days.  . famotidine (PEPCID) 20 MG tablet TAKE 1 TABLET BY MOUTH EVERYDAY AT BEDTIME  . fenofibrate (TRICOR) 145 MG tablet Take 1 tablet (145 mg total) by mouth daily. PLEASE CALL OFFICE TO SCHEDULE APPOINTMENT TO CONTINUE GETTING REFILLS  . fluocinonide (LIDEX) 0.05 % external solution Apply 1 application topically 2 (two) times daily as needed. Apply to scalp twice daily as need for itch  . hydrocortisone 2.5 % cream Apply BID to affected area in underarms and groin prn flares  . hydroquinone 4 % cream 1 APPLICATION TOPICALLY DAILY AS NEEDED FOR BLEMISHES.  . hydrOXYzine (VISTARIL) 50 MG capsule TAKE 1 CAPSLE BY MOUTH AT NOON, 1 AT 6 PM, AND 1 AS NEEDED  . lamoTRIgine (LAMICTAL)  150 MG tablet Take 2 tablets (300 mg total) by mouth at bedtime.  . levalbuterol (XOPENEX HFA) 45 MCG/ACT inhaler INHALE 1 TO 2 PUFFS BY MOUTH EVERY 6 HOURS AS NEEDED FOR WHEEZE  . levalbuterol (XOPENEX) 1.25 MG/3ML nebulizer solution USE 1 VIAL VIA NEBULIZER EVERY 4 HOURS AS NEEDED FOR WHEEZING OR SHORTNESS OF BREATH  . Lysine 1000 MG TABS Take 2,000-4,000 mg by mouth at bedtime. 2000 mg scheduled at bedtime and patient will take 4000 mg if she has outbreak  . Magnesium 500 MG TABS Take 1 tablet by mouth at bedtime.  . meclizine (ANTIVERT) 25 MG tablet Take 0.5-1 tablets (12.5-25 mg total) by mouth 3 (three) times daily as needed for dizziness (sedation caution).  . methocarbamol (ROBAXIN) 500 MG tablet TAKE 1-2 TABLETS (500-1,000 MG TOTAL) BY MOUTH DAILY AS NEEDED FOR MUSCLE SPASMS.  . metoprolol succinate (TOPROL-XL) 50 MG 24  hr tablet Take 1 tablet (50 mg total) by mouth daily. Take with or immediately following a meal.  . neomycin-bacitracin-polymyxin (NEOSPORIN) [phone] ointment Apply 1 application topically 4 (four) times daily as needed (for cut/scrapes.).  Marland Kitchen Olopatadine HCl 0.2 % SOLN 1 drop once daily  . OVER THE COUNTER MEDICATION Delta 8 and Delta 9 gummies  . pantoprazole (PROTONIX) 40 MG tablet Take 1 tablet (40 mg total) by mouth 2 (two) times daily. For heartburn  . phenazopyridine (PYRIDIUM) 95 MG tablet Take 95 mg by mouth 3 (three) times daily as needed for pain.  . polyethylene glycol powder (GLYCOLAX/MIRALAX) powder MIX 17 GRAMS (1 CAPFUL) WITH 4-8 OZ OF LIQUID AND TAKE BY MOUTH TWICE DAILY AS NEEDED  . temazepam (RESTORIL) 30 MG capsule TAKE ONE TO TWO CAPSULES BY MOUTH NIGHTLY AT BEDTIME  . traZODone (DESYREL) 50 MG tablet Take 2 tablets (100 mg total) by mouth at bedtime.  . valACYclovir (VALTREX) 1000 MG tablet Take 1 tablet (1,000 mg total) by mouth daily. For herpes outbreak prevention   No facility-administered encounter medications on file as of 05/08/2021.

## 2021-05-12 ENCOUNTER — Ambulatory Visit: Payer: PPO

## 2021-05-12 ENCOUNTER — Other Ambulatory Visit: Payer: Self-pay

## 2021-05-13 ENCOUNTER — Other Ambulatory Visit: Payer: Self-pay | Admitting: *Deleted

## 2021-05-13 MED ORDER — REPATHA SURECLICK 140 MG/ML ~~LOC~~ SOAJ
1.0000 | SUBCUTANEOUS | 3 refills | Status: DC
Start: 2021-05-13 — End: 2022-04-24

## 2021-05-14 DIAGNOSIS — E119 Type 2 diabetes mellitus without complications: Secondary | ICD-10-CM | POA: Diagnosis not present

## 2021-05-14 LAB — HM DIABETES EYE EXAM

## 2021-05-20 ENCOUNTER — Encounter: Payer: Self-pay | Admitting: Primary Care

## 2021-05-20 ENCOUNTER — Other Ambulatory Visit: Payer: Self-pay

## 2021-05-20 MED ORDER — FENOFIBRATE 145 MG PO TABS: 145.0000 mg | ORAL_TABLET | Freq: Every day | ORAL | 0 refills | Status: DC

## 2021-05-23 DIAGNOSIS — M1611 Unilateral primary osteoarthritis, right hip: Secondary | ICD-10-CM | POA: Diagnosis not present

## 2021-05-27 DIAGNOSIS — G4733 Obstructive sleep apnea (adult) (pediatric): Secondary | ICD-10-CM | POA: Diagnosis not present

## 2021-06-03 ENCOUNTER — Ambulatory Visit (INDEPENDENT_AMBULATORY_CARE_PROVIDER_SITE_OTHER): Payer: PPO

## 2021-06-03 DIAGNOSIS — Z0000 Encounter for general adult medical examination without abnormal findings: Secondary | ICD-10-CM

## 2021-06-03 NOTE — Progress Notes (Signed)
PCP notes:  Health Maintenance: Shingrix- due Colonoscopy- declined Mammogram- declined   Abnormal Screenings: none   Patient concerns: none   Nurse concerns: none   Next PCP appt.: 06/04/2021 @ 2 pm

## 2021-06-03 NOTE — Patient Instructions (Signed)
Ms. Emily Moon , Thank you for taking time to come for your Medicare Wellness Visit. I appreciate your ongoing commitment to your health goals. Please review the following plan we discussed and let me know if I can assist you in the future.   Screening recommendations/referrals: Colonoscopy: declined Mammogram: declined Bone Density: due at age 60 Recommended yearly ophthalmology/optometry visit for glaucoma screening and checkup Recommended yearly dental visit for hygiene and checkup  Vaccinations: Influenza vaccine: Up to date, completed 07/04/2020, due 06/2021 Pneumococcal vaccine: due at age 78  Tdap vaccine: decline-insurance Shingles vaccine: due, check with your insurance regarding coverage if interested   Covid-19: Completed series  Advanced directives: Advance directive discussed with you today. Even though you declined this today please call our office should you change your mind and we can give you the proper paperwork for you to fill out.  Conditions/risks identified: diabetes  Next appointment: Follow up in one year for your annual wellness visit.   Preventive Care 40-64 Years, Female Preventive care refers to lifestyle choices and visits with your health care provider that can promote health and wellness. What does preventive care include? A yearly physical exam. This is also called an annual well check. Dental exams once or twice a year. Routine eye exams. Ask your health care provider how often you should have your eyes checked. Personal lifestyle choices, including: Daily care of your teeth and gums. Regular physical activity. Eating a healthy diet. Avoiding tobacco and drug use. Limiting alcohol use. Practicing safe sex. Taking low-dose aspirin daily starting at age 64. Taking vitamin and mineral supplements as recommended by your health care provider. What happens during an annual well check? The services and screenings done by your health care provider during  your annual well check will depend on your age, overall health, lifestyle risk factors, and family history of disease. Counseling  Your health care provider may ask you questions about your: Alcohol use. Tobacco use. Drug use. Emotional well-being. Home and relationship well-being. Sexual activity. Eating habits. Work and work Statistician. Method of birth control. Menstrual cycle. Pregnancy history. Screening  You may have the following tests or measurements: Height, weight, and BMI. Blood pressure. Lipid and cholesterol levels. These may be checked every 5 years, or more frequently if you are over 42 years old. Skin check. Lung cancer screening. You may have this screening every year starting at age 59 if you have a 30-pack-year history of smoking and currently smoke or have quit within the past 15 years. Fecal occult blood test (FOBT) of the stool. You may have this test every year starting at age 9. Flexible sigmoidoscopy or colonoscopy. You may have a sigmoidoscopy every 5 years or a colonoscopy every 10 years starting at age 55. Hepatitis C blood test. Hepatitis B blood test. Sexually transmitted disease (STD) testing. Diabetes screening. This is done by checking your blood sugar (glucose) after you have not eaten for a while (fasting). You may have this done every 1-3 years. Mammogram. This may be done every 1-2 years. Talk to your health care provider about when you should start having regular mammograms. This may depend on whether you have a family history of breast cancer. BRCA-related cancer screening. This may be done if you have a family history of breast, ovarian, tubal, or peritoneal cancers. Pelvic exam and Pap test. This may be done every 3 years starting at age 75. Starting at age 48, this may be done every 5 years if you have a Pap test  in combination with an HPV test. Bone density scan. This is done to screen for osteoporosis. You may have this scan if you are at  high risk for osteoporosis. Discuss your test results, treatment options, and if necessary, the need for more tests with your health care provider. Vaccines  Your health care provider may recommend certain vaccines, such as: Influenza vaccine. This is recommended every year. Tetanus, diphtheria, and acellular pertussis (Tdap, Td) vaccine. You may need a Td booster every 10 years. Zoster vaccine. You may need this after age 15. Pneumococcal 13-valent conjugate (PCV13) vaccine. You may need this if you have certain conditions and were not previously vaccinated. Pneumococcal polysaccharide (PPSV23) vaccine. You may need one or two doses if you smoke cigarettes or if you have certain conditions. Talk to your health care provider about which screenings and vaccines you need and how often you need them. This information is not intended to replace advice given to you by your health care provider. Make sure you discuss any questions you have with your health care provider. Document Released: 11/29/2015 Document Revised: 07/22/2016 Document Reviewed: 09/03/2015 Elsevier Interactive Patient Education  2017 [place] Prevention in the Home Falls can cause injuries. They can happen to people of all ages. There are many things you can do to make your home safe and to help prevent falls. What can I do on the outside of my home? Regularly fix the edges of walkways and driveways and fix any cracks. Remove anything that might make you trip as you walk through a door, such as a raised step or threshold. Trim any bushes or trees on the path to your home. Use bright outdoor lighting. Clear any walking paths of anything that might make someone trip, such as rocks or tools. Regularly check to see if handrails are loose or broken. Make sure that both sides of any steps have handrails. Any raised decks and porches should have guardrails on the edges. Have any leaves, snow, or ice cleared  regularly. Use sand or salt on walking paths during winter. Clean up any spills in your garage right away. This includes oil or grease spills. What can I do in the bathroom? Use night lights. Install grab bars by the toilet and in the tub and shower. Do not use towel bars as grab bars. Use non-skid mats or decals in the tub or shower. If you need to sit down in the shower, use a plastic, non-slip stool. Keep the floor dry. Clean up any water that spills on the floor as soon as it happens. Remove soap buildup in the tub or shower regularly. Attach bath mats securely with double-sided non-slip rug tape. Do not have throw rugs and other things on the floor that can make you trip. What can I do in the bedroom? Use night lights. Make sure that you have a light by your bed that is easy to reach. Do not use any sheets or blankets that are too big for your bed. They should not hang down onto the floor. Have a firm chair that has side arms. You can use this for support while you get dressed. Do not have throw rugs and other things on the floor that can make you trip. What can I do in the kitchen? Clean up any spills right away. Avoid walking on wet floors. Keep items that you use a lot in easy-to-reach places. If you need to reach something above you, use a strong step  stool that has a grab bar. Keep electrical cords out of the way. Do not use floor polish or wax that makes floors slippery. If you must use wax, use non-skid floor wax. Do not have throw rugs and other things on the floor that can make you trip. What can I do with my stairs? Do not leave any items on the stairs. Make sure that there are handrails on both sides of the stairs and use them. Fix handrails that are broken or loose. Make sure that handrails are as long as the stairways. Check any carpeting to make sure that it is firmly attached to the stairs. Fix any carpet that is loose or worn. Avoid having throw rugs at the top or  bottom of the stairs. If you do have throw rugs, attach them to the floor with carpet tape. Make sure that you have a light switch at the top of the stairs and the bottom of the stairs. If you do not have them, ask someone to add them for you. What else can I do to help prevent falls? Wear shoes that: Do not have high heels. Have rubber bottoms. Are comfortable and fit you well. Are closed at the toe. Do not wear sandals. If you use a stepladder: Make sure that it is fully opened. Do not climb a closed stepladder. Make sure that both sides of the stepladder are locked into place. Ask someone to hold it for you, if possible. Clearly mark and make sure that you can see: Any grab bars or handrails. First and last steps. Where the edge of each step is. Use tools that help you move around (mobility aids) if they are needed. These include: Canes. Walkers. Scooters. Crutches. Turn on the lights when you go into a dark area. Replace any light bulbs as soon as they burn out. Set up your furniture so you have a clear path. Avoid moving your furniture around. If any of your floors are uneven, fix them. If there are any pets around you, be aware of where they are. Review your medicines with your doctor. Some medicines can make you feel dizzy. This can increase your chance of falling. Ask your doctor what other things that you can do to help prevent falls. This information is not intended to replace advice given to you by your health care provider. Make sure you discuss any questions you have with your health care provider. Document Released: 08/29/2009 Document Revised: 04/09/2016 Document Reviewed: 12/07/2014 Elsevier Interactive Patient Education  2017 [employer].

## 2021-06-03 NOTE — Progress Notes (Addendum)
Subjective:   Emily Moon is a 60 y.o. female who presents for Medicare Annual (Subsequent) preventive examination.  Review of Systems: N/A      I connected with the patient today by telephone and verified that I am speaking with the correct person using two identifiers. Location patient: home Location nurse: work Persons participating in the telephone visit: patient, nurse.   I discussed the limitations, risks, security and privacy concerns of performing an evaluation and management service by telephone and the availability of in person appointments. I also discussed with the patient that there may be a patient responsible charge related to this service. The patient expressed understanding and verbally consented to this telephonic visit.        Cardiac Risk Factors include: advanced age (>33men, >54 women);diabetes mellitus     Objective:    Today's Vitals   There is no height or weight on file to calculate BMI.  Advanced Directives 06/03/2021 07/25/2020 04/05/2020 03/06/2020 02/19/2020 02/06/2019 05/24/2017  Does Patient Have a Medical Advance Directive? No No No No No No No  Would patient like information on creating a medical advance directive? No - Patient declined No - Patient declined No - Patient declined Yes (Inpatient - patient requests chaplain consult to create a medical advance directive);Yes (Inpatient - patient defers creating a medical advance directive at this time - Information given);Yes (Inpatient - patient defers creating a medical advance directive and declines information at this time);Yes (ED - Information included in AVS);Yes (MAU/Ambulatory/Procedural Areas - Information given) No - Patient declined No - Patient declined No - Patient declined    Current Medications (verified) Outpatient Encounter Medications as of 06/03/2021  Medication Sig   ASPERCREME LIDOCAINE EX Apply 1 application topically 4 (four) times daily as needed (for pain.).    ASPIRIN 81 PO  Take by mouth daily.   Azelastine HCl 137 MCG/SPRAY SOLN PLACE 2 SPRAYS INTO BOTH NOSTRILS 2 (TWO) TIMES DAILY   BIOTIN PO Take 200 mg by mouth daily.    budesonide-formoterol (SYMBICORT) 80-4.5 MCG/ACT inhaler Inhale 2 puffs into the lungs 2 (two) times daily.   calcipotriene-betamethasone (TACLONEX) ointment Apply topically daily.   cetirizine (ZYRTEC) 10 MG tablet Take 10 mg by mouth 2 (two) times daily.   Cholecalciferol (VITAMIN D3) 10000 units capsule Take 30,000 Units by mouth daily.    ciclopirox (LOPROX) 0.77 % cream Apply topically 2 (two) times daily.   clindamycin (CLEOCIN T) 1 % external solution Apply 1-2 times daily to scalp as needed for itch.   clobetasol (OLUX) 0.05 % topical foam Apply to affected areas at arms, legs, trunk twice a day up to 2 weeks. Avoid applying to face, groin, and axilla. Use as directed. Risk of skin atrophy with long-term use reviewed.   clobetasol (TEMOVATE) 0.05 % external solution Mix clobetasol solution with  CeraVe cream Use twice daily to affected areas.Avoid Face, groin and underarm   Continuous Blood Gluc Sensor (FREESTYLE LIBRE 14 DAY SENSOR) MISC PLACE INTO SKIN FOR CONTINUOUS CHECK OF BLOOD SUGAR DAILY   cyanocobalamin (,VITAMIN B-12,) 1000 MCG/ML injection Inject 1 mL (1,000 mcg total) into the muscle every 3 (three) months.   desipramine (NORPRAMIN) 25 MG tablet TAKE 1 TABLET BY MOUTH IN THE MORNING   diclofenac sodium (VOLTAREN) 1 % GEL Apply 4 g topically 4 (four) times daily.   docusate sodium (COLACE) 100 MG capsule Take 1 capsule (100 mg total) by mouth 2 (two) times daily.   Dulaglutide (TRULICITY) 1.5 LS/9.3TD  SOPN Inject 1.5 mg into the skin once a week. For diabetes.   DULoxetine (CYMBALTA) 20 MG capsule Take 2 capsules (40 mg total) by mouth daily.   Evolocumab (REPATHA SURECLICK) 073 MG/ML SOAJ Inject 1 Dose into the skin every 14 (fourteen) days.   famotidine (PEPCID) 20 MG tablet TAKE 1 TABLET BY MOUTH EVERYDAY AT BEDTIME    fenofibrate (TRICOR) 145 MG tablet Take 1 tablet (145 mg total) by mouth daily.   fluocinonide (LIDEX) 0.05 % external solution Apply 1 application topically 2 (two) times daily as needed. Apply to scalp twice daily as need for itch   hydrocortisone 2.5 % cream Apply BID to affected area in underarms and groin prn flares   hydroquinone 4 % cream 1 APPLICATION TOPICALLY DAILY AS NEEDED FOR BLEMISHES.   hydrOXYzine (VISTARIL) 50 MG capsule TAKE 1 CAPSLE BY MOUTH AT NOON, 1 AT 6 PM, AND 1 AS NEEDED   lamoTRIgine (LAMICTAL) 150 MG tablet Take 2 tablets (300 mg total) by mouth at bedtime.   levalbuterol (XOPENEX HFA) 45 MCG/ACT inhaler INHALE 1 TO 2 PUFFS BY MOUTH EVERY 6 HOURS AS NEEDED FOR WHEEZE   levalbuterol (XOPENEX) 1.25 MG/3ML nebulizer solution USE 1 VIAL VIA NEBULIZER EVERY 4 HOURS AS NEEDED FOR WHEEZING OR SHORTNESS OF BREATH   Lysine 1000 MG TABS Take 2,000-4,000 mg by mouth at bedtime. 2000 mg scheduled at bedtime and patient will take 4000 mg if she has outbreak   Magnesium 500 MG TABS Take 1 tablet by mouth at bedtime.   meclizine (ANTIVERT) 25 MG tablet Take 0.5-1 tablets (12.5-25 mg total) by mouth 3 (three) times daily as needed for dizziness (sedation caution).   methocarbamol (ROBAXIN) 500 MG tablet TAKE 1-2 TABLETS (500-1,000 MG TOTAL) BY MOUTH DAILY AS NEEDED FOR MUSCLE SPASMS.   metoprolol succinate (TOPROL-XL) 50 MG 24 hr tablet Take 1 tablet (50 mg total) by mouth daily. Take with or immediately following a meal.   neomycin-bacitracin-polymyxin (NEOSPORIN) [phone] ointment Apply 1 application topically 4 (four) times daily as needed (for cut/scrapes.).   Olopatadine HCl 0.2 % SOLN 1 drop once daily   OVER THE COUNTER MEDICATION Delta 8 and Delta 9 gummies   pantoprazole (PROTONIX) 40 MG tablet Take 1 tablet (40 mg total) by mouth 2 (two) times daily. For heartburn   phenazopyridine (PYRIDIUM) 95 MG tablet Take 95 mg by mouth 3 (three) times daily as needed for pain.    polyethylene glycol powder (GLYCOLAX/MIRALAX) powder MIX 17 GRAMS (1 CAPFUL) WITH 4-8 OZ OF LIQUID AND TAKE BY MOUTH TWICE DAILY AS NEEDED   temazepam (RESTORIL) 30 MG capsule TAKE ONE TO TWO CAPSULES BY MOUTH NIGHTLY AT BEDTIME   traZODone (DESYREL) 50 MG tablet Take 2 tablets (100 mg total) by mouth at bedtime.   valACYclovir (VALTREX) 1000 MG tablet Take 1 tablet (1,000 mg total) by mouth daily. For herpes outbreak prevention   No facility-administered encounter medications on file as of 06/03/2021.    Allergies (verified) Albuterol, Crestor [rosuvastatin], Halcion [triazolam], Levaquin [levofloxacin in d5w], Naproxen sodium, Tylenol [acetaminophen], Cefaclor, Sulfa antibiotics, Tramadol, Zinc, Aripiprazole, Diclofenac sodium, and Ibuprofen   History: Past Medical History:  Diagnosis Date   Arthritis    osteo   Asthma    Bipolar disorder ([place]) 05/21/14   Cataracts, bilateral    DDD (degenerative disc disease), cervical    also back   Depression    Dizziness    Positional   Edema    feet/legs   Fibromyalgia syndrome  Fungal infection    Finger nails   GERD (gastroesophageal reflux disease)    Gout    Headache    seasonal allergies   Heart palpitations    Hip dysplasia, 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   Hypercholesterolemia    Multiple sclerosis (HCC)    weakness   Nephrolithiasis    kidney stones   Osteoporosis    osteoarthritis   Pneumonia    PONV (postoperative nausea and vomiting)    no problem after cataract surgery   Prediabetes 07/22/2018   Psoriasis    Renal stone    Shortness of breath dyspnea    wheezing   Sleep apnea 2012   sleep study / slight, no interventions   Urinary frequency    Past Surgical History:  Procedure Laterality Date   CATARACT EXTRACTION W/PHACO Left 05/21/2015   Procedure: CATARACT EXTRACTION PHACO AND INTRAOCULAR LENS PLACEMENT ([place]);  Surgeon: Birder Robson, MD;  Location: [facility];  Service: Ophthalmology;  Laterality: Left;  [place]  00:35 AP% 22.9 CDE 8.11 fluid pack lot #[id number]   CATARACT EXTRACTION W/PHACO Right 06/04/2015   Procedure: CATARACT EXTRACTION PHACO AND INTRAOCULAR LENS PLACEMENT ([facility]);  Surgeon: Birder Robson, MD;  Location: [facility];  Service: Ophthalmology;  Laterality: Right;  US:00:48 AP%: 10.5 CDE:5.08 Fluid lot #[id number]   CYSTOSCOPY/URETEROSCOPY/HOLMIUM LASER/STENT PLACEMENT Bilateral 09/22/2016   Procedure: CYSTOSCOPY/URETEROSCOPY/HOLMIUM LASER/STENT PLACEMENT;  Surgeon: Hollice Espy, MD;  Location: [facility];  Service: Urology;  Laterality: Bilateral;   EYE SURGERY  2015   tissue biopsy   FOOT SURGERY  2015   JOINT REPLACEMENT Left 2013   hip replacement   LITHOTRIPSY     PTOSIS REPAIR Bilateral 02/18/2016   Procedure: BILATERAL PTOSIS REPAIR UPPER EYELIDS;  Surgeon: Karle Starch, MD;  Location: [facility];  Service: Ophthalmology;  Laterality: Bilateral;  LEAVE PT EARLY AM   thumb surgery Right    TONSILLECTOMY  1973   Family History  Problem Relation Age of Onset   Cancer Father        Abdomen with mastasis   Cancer Mother    Heart disease Mother    Kidney disease Neg Hx    Bladder Cancer Neg Hx    Prostate cancer Neg Hx    Kidney cancer Neg Hx    Social History   Socioeconomic History   Marital status: Divorced    Spouse name: Not on file   Number of children: 1   Years of education: Not on file   Highest education level: Not on file  Occupational History   Occupation: Therapist, 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  Tobacco Use   Smoking status: Former    Packs/day: 1.00    Years: 25.00    Pack years: 25.00    Types: Cigarettes    Quit date: 2019    Years since quitting: 3.5   Smokeless tobacco: Never   Tobacco comments:    occasional use  Vaping Use   Vaping Use: Former  Substance and Sexual Activity   Alcohol use: No    Alcohol/week: 0.0 standard drinks   Drug use: No    Types: Methylphenidate   Sexual activity: Never  Other Topics  Concern   Not on file  Social History Narrative   Not on file   Social Determinants of Health   Financial Resource Strain: Low Risk    Difficulty of Paying Living Expenses: Not hard at all  Food Insecurity: No Food Insecurity   Worried About Running  Out of Food in the Last Year: Never true   Ran Out of Food in the Last Year: Never true  Transportation Needs: No Transportation Needs   Lack of Transportation (Medical): No   Lack of Transportation (Non-Medical): No  Physical Activity: Inactive   Days of Exercise per Week: 0 days   Minutes of Exercise per Session: 0 min  Stress: No Stress Concern Present   Feeling of Stress : Not at all  Social Connections: Not on file    Tobacco Counseling Counseling given: Not Answered Tobacco comments: occasional use   Clinical Intake:  Pre-visit preparation completed: Yes  Pain : 0-10 Pain Type: Chronic pain Pain Location: Back (hip) Pain Descriptors / Indicators: Aching Pain Onset: More than a month ago Pain Frequency: Intermittent     Nutritional Risks: None Diabetes: Yes CBG done?: No Did pt. bring in CBG monitor from home?: No  How often do you need to have someone help you when you read instructions, pamphlets, or other written materials from your doctor or pharmacy?: 1 - Never  Diabetic: Yes Nutrition Risk Assessment:  Has the patient had any N/V/D within the last 2 months?  No  Does the patient have any non-healing wounds?  No  Has the patient had any unintentional weight loss or weight gain?  No   Diabetes:  Is the patient diabetic?  Yes  If diabetic, was a CBG obtained today?  No  telephone visit  Did the patient bring in their glucometer from home?  No  telephone visit  How often do you monitor your CBG's? daily.   Financial Strains and Diabetes Management:  Are you having any financial strains with the device, your supplies or your medication? No .  Does the patient want to be seen by Chronic Care Management  for management of their diabetes?  No  Would the patient like to be referred to a Nutritionist or for Diabetic Management?  No   Diabetic Exams:  Diabetic Eye Exam: Completed 05/14/2021 Diabetic Foot Exam: Completed 03/06/2021   Interpreter Needed?: No  Information entered by :: CJohnson, RN   Activities of Daily Living In your present state of health, do you have any difficulty performing the following activities: 06/03/2021  Vision? N  Difficulty concentrating or making decisions? Y  Comment short term memory issues  Walking or climbing stairs? Y  Dressing or bathing? N  Doing errands, shopping? N  Preparing Food and eating ? Y  Using the Toilet? N  In the past six months, have you accidently leaked urine? Y  Comment wears pads  Do you have problems with loss of bowel control? N  Managing your Medications? N  Managing your Finances? N  Housekeeping or managing your Housekeeping? Y  Some recent data might be hidden    Patient Care Team: [name], NP as PCP - General (Internal Medicine) [name] Sable, MD as PCP - Cardiology (Cardiology) Birder Robson, MD as Referring Physician (Ophthalmology)  Indicate any recent Medical Services you may have received from other than [facility] providers in the past year (date may be approximate).     Assessment:   This is a routine wellness examination for [place].  Hearing/Vision screen Vision Screening - Comments:: Patient gets annual eye exams   Dietary issues and exercise activities discussed: Current Exercise Habits: The patient does not participate in regular exercise at present, Exercise limited by: None identified   Goals Addressed  This Visit's Progress    Patient Stated       06/03/2021, I will maintain and continue medications as prescribed.       Depression Screen PHQ 2/9 Scores 06/03/2021 04/23/2021 04/05/2020 03/06/2020 11/27/2019 11/24/2017 12/25/2016  PHQ - 2 Score 6 6 6 6  0 6 0  PHQ- 9  Score 15 15 6  - - 24 -    Fall Risk Fall Risk  06/03/2021 04/05/2020 03/06/2020 11/27/2019 11/24/2017  Falls in the past year? 1 1 1 1  Yes  Number falls in past yr: 1 1 1 1  -  Injury with Fall? 1 0 - 1 Yes  Risk for fall due to : Impaired balance/gait;Impaired mobility;Medication side effect History of fall(s);Impaired balance/gait - - -  Follow up Falls evaluation completed;Falls prevention discussed Falls evaluation completed;Falls prevention discussed - - -    FALL RISK PREVENTION PERTAINING TO THE HOME:  Any stairs in or around the home? Yes  If so, are there any without handrails? No  Home free of loose throw rugs in walkways, pet beds, electrical cords, etc? Yes  Adequate lighting in your home to reduce risk of falls? Yes   ASSISTIVE DEVICES UTILIZED TO PREVENT FALLS:  Life alert? No  Use of a cane, walker or w/c? Yes  Grab bars in the bathroom? Yes  Shower chair or bench in shower? Yes  Elevated toilet seat or a handicapped toilet? No   TIMED UP AND GO:  Was the test performed?  N/A telephone visit .    Cognitive Function: MMSE - Mini Mental State Exam 06/03/2021 04/05/2020  Not completed: Refused Refused  Mini Cog  Mini-Cog screen was completed. Maximum score is 22. A value of 0 denotes this part of the MMSE was not completed or the patient failed this part of the Mini-Cog screening.       Immunizations Immunization History  Administered Date(s) Administered   Influenza Split 08/05/2011   Influenza, Seasonal, Injecte, Preservative Fre 06/26/2018   Influenza,inj,Quad PF,6+ Mos 08/17/2013, 07/13/2015, 07/06/2017, 06/26/2018, 06/26/2019, 07/04/2020   Influenza-Unspecified 07/08/2016, 06/18/2017, 06/26/2018   PFIZER(Purple Top)SARS-COV-2 Vaccination 02/06/2020, 02/28/2020, 07/01/2020, 03/19/2021   Pneumococcal Conjugate-13 07/05/2014   Pneumococcal Polysaccharide-23 07/13/2015    TDAP status: Due, Education has been provided regarding the importance of this vaccine.  Advised may receive this vaccine at local pharmacy or Health Dept. Aware to provide a copy of the vaccination record if obtained from local pharmacy or Health Dept. Verbalized acceptance and understanding.  Flu Vaccine status: Up to date  Pneumococcal vaccine status: Up to date  Covid-19 vaccine status: Completed vaccines  Qualifies for Shingles Vaccine? Yes   Zostavax completed No   Shingrix Completed?: No.    Education has been provided regarding the importance of this vaccine. Patient has been advised to call insurance company to determine out of pocket expense if they have not yet received this vaccine. Advised may also receive vaccine at local pharmacy or Health Dept. Verbalized acceptance and understanding.  Screening Tests Health Maintenance  Topic Date Due   HIV Screening  Never done   Zoster Vaccines- Shingrix (1 of 2) Never done   Pneumococcal Vaccine 73-13 Years old (3 - PPSV23 or PCV20) 07/12/2020   PAP SMEAR-Modifier  11/24/2020   MAMMOGRAM  06/03/2022 (Originally 05/16/2016)   COLONOSCOPY (Pts 45-36yrs Insurance coverage will need to be confirmed)  06/03/2022 (Originally 04/09/2007)   TETANUS/TDAP  04/05/2024 (Originally 04/08/1981)   INFLUENZA VACCINE  06/16/2021   COVID-19 Vaccine (5 - Booster for [employer]  series) 07/20/2021   HEMOGLOBIN A1C  09/09/2021   FOOT EXAM  03/06/2022   URINE MICROALBUMIN  03/10/2022   OPHTHALMOLOGY EXAM  05/14/2022   PNEUMOCOCCAL POLYSACCHARIDE VACCINE AGE 41-64 HIGH RISK  Completed   Hepatitis C Screening  Completed   HPV VACCINES  Aged Out    Health Maintenance  Health Maintenance Due  Topic Date Due   HIV Screening  Never done   Zoster Vaccines- Shingrix (1 of 2) Never done   Pneumococcal Vaccine 93-48 Years old (64 - PPSV23 or PCV20) 07/12/2020   PAP SMEAR-Modifier  11/24/2020    Colorectal cancer screening: declined  Mammogram status: declined  Bone Density status: due at age 30   Lung Cancer Screening: (Low Dose CT Chest  recommended if Age 39-80 years, 30 pack-year currently smoking OR have quit w/in 15years.) does not qualify.    Additional Screening:  Hepatitis C Screening: does qualify; Completed 12/17/2016  Vision Screening: Recommended annual ophthalmology exams for early detection of glaucoma and other disorders of the eye. Is the patient up to date with their annual eye exam?  Yes  Who is the provider or what is the name of the office in which the patient attends annual eye exams? Dr. George Ina If pt is not established with a provider, would they like to be referred to a provider to establish care? No .   Dental Screening: Recommended annual dental exams for proper oral hygiene  Community Resource Referral / Chronic Care Management: CRR required this visit?  No   CCM required this visit?  No      Plan:     I have personally reviewed and noted the following in the patient's chart:   Medical and social history Use of alcohol, tobacco or illicit drugs  Current medications and supplements including opioid prescriptions.  Functional ability and status Nutritional status Physical activity Advanced directives List of other physicians Hospitalizations, surgeries, and ER visits in previous 12 months Vitals Screenings to include cognitive, depression, and falls Referrals and appointments  In addition, I have reviewed and discussed with patient certain preventive protocols, quality metrics, and best practice recommendations. A written personalized care plan for preventive services as well as general preventive health recommendations were provided to patient.   Due to this being a telephonic visit, the after visit summary with patients personalized plan was offered to patient via office or my-chart. Patient preferred to pick up at office at next visit or via mychart.   Andrez Grime, LPN   [id number]

## 2021-06-04 ENCOUNTER — Other Ambulatory Visit (HOSPITAL_COMMUNITY): Payer: Self-pay | Admitting: Psychiatry

## 2021-06-04 ENCOUNTER — Other Ambulatory Visit (HOSPITAL_COMMUNITY): Payer: Self-pay | Admitting: *Deleted

## 2021-06-04 ENCOUNTER — Other Ambulatory Visit (HOSPITAL_COMMUNITY)
Admission: RE | Admit: 2021-06-04 | Discharge: 2021-06-04 | Disposition: A | Discharge status: Home / Self Care | Payer: PPO | Source: Ambulatory Visit | Attending: Primary Care | Admitting: Primary Care

## 2021-06-04 ENCOUNTER — Ambulatory Visit (INDEPENDENT_AMBULATORY_CARE_PROVIDER_SITE_OTHER): Payer: PPO | Admitting: Primary Care

## 2021-06-04 ENCOUNTER — Encounter: Payer: Self-pay | Admitting: Primary Care

## 2021-06-04 ENCOUNTER — Other Ambulatory Visit: Payer: Self-pay

## 2021-06-04 VITALS — BP 110/68 | HR 100 | Temp 96.2°F | Ht 69.0 in | Wt 240.0 lb

## 2021-06-04 DIAGNOSIS — E785 Hyperlipidemia, unspecified: Secondary | ICD-10-CM

## 2021-06-04 DIAGNOSIS — L409 Psoriasis, unspecified: Secondary | ICD-10-CM

## 2021-06-04 DIAGNOSIS — Z0000 Encounter for general adult medical examination without abnormal findings: Secondary | ICD-10-CM | POA: Diagnosis not present

## 2021-06-04 DIAGNOSIS — Z01419 Encounter for gynecological examination (general) (routine) without abnormal findings: Secondary | ICD-10-CM | POA: Insufficient documentation

## 2021-06-04 DIAGNOSIS — Z1151 Encounter for screening for human papillomavirus (HPV): Secondary | ICD-10-CM | POA: Diagnosis not present

## 2021-06-04 DIAGNOSIS — G4733 Obstructive sleep apnea (adult) (pediatric): Secondary | ICD-10-CM | POA: Diagnosis not present

## 2021-06-04 DIAGNOSIS — E538 Deficiency of other specified B group vitamins: Secondary | ICD-10-CM

## 2021-06-04 DIAGNOSIS — E119 Type 2 diabetes mellitus without complications: Secondary | ICD-10-CM

## 2021-06-04 DIAGNOSIS — K219 Gastro-esophageal reflux disease without esophagitis: Secondary | ICD-10-CM

## 2021-06-04 DIAGNOSIS — J4530 Mild persistent asthma, uncomplicated: Secondary | ICD-10-CM | POA: Diagnosis not present

## 2021-06-04 DIAGNOSIS — F3170 Bipolar disorder, currently in remission, most recent episode unspecified: Secondary | ICD-10-CM

## 2021-06-04 DIAGNOSIS — G4700 Insomnia, unspecified: Secondary | ICD-10-CM

## 2021-06-04 DIAGNOSIS — G35 Multiple sclerosis: Secondary | ICD-10-CM

## 2021-06-04 DIAGNOSIS — B009 Herpesviral infection, unspecified: Secondary | ICD-10-CM | POA: Diagnosis not present

## 2021-06-04 DIAGNOSIS — Z124 Encounter for screening for malignant neoplasm of cervix: Secondary | ICD-10-CM

## 2021-06-04 DIAGNOSIS — G35D Multiple sclerosis, unspecified: Secondary | ICD-10-CM

## 2021-06-04 DIAGNOSIS — Z1231 Encounter for screening mammogram for malignant neoplasm of breast: Secondary | ICD-10-CM

## 2021-06-04 DIAGNOSIS — E559 Vitamin D deficiency, unspecified: Secondary | ICD-10-CM

## 2021-06-04 DIAGNOSIS — M159 Polyosteoarthritis, unspecified: Secondary | ICD-10-CM

## 2021-06-04 DIAGNOSIS — L509 Urticaria, unspecified: Secondary | ICD-10-CM

## 2021-06-04 DIAGNOSIS — R42 Dizziness and giddiness: Secondary | ICD-10-CM

## 2021-06-04 LAB — POCT GLYCOSYLATED HEMOGLOBIN (HGB A1C): Hemoglobin A1C: 6.3 % — AB (ref 4.0–5.6)

## 2021-06-04 MED ORDER — CYANOCOBALAMIN 1000 MCG/ML IJ SOLN
1000.0000 ug | Freq: Once | INTRAMUSCULAR | Status: AC
  Administered 2021-06-04: 1000 ug via INTRAMUSCULAR

## 2021-06-04 MED ORDER — TEMAZEPAM 30 MG PO CAPS: ORAL_CAPSULE | ORAL | 1 refills | Status: DC

## 2021-06-04 MED ORDER — PANTOPRAZOLE SODIUM 40 MG PO TBEC
40.0000 mg | DELAYED_RELEASE_TABLET | Freq: Every day | ORAL | 3 refills | Status: DC
Start: 2021-06-04 — End: 2022-02-01

## 2021-06-04 MED ORDER — FAMOTIDINE 20 MG PO TABS: 20.0000 mg | ORAL_TABLET | Freq: Two times a day (BID) | ORAL | 3 refills | Status: DC

## 2021-06-04 MED ORDER — "SYRINGE/NEEDLE (DISP) 23G X 1"" 1 ML MISC": 0 refills | Status: DC

## 2021-06-04 MED ORDER — CYANOCOBALAMIN 1000 MCG/ML IJ SOLN: 1000.0000 ug | INTRAMUSCULAR | 1 refills | Status: DC

## 2021-06-04 NOTE — Assessment & Plan Note (Signed)
Doing well on pantoprazole 40 mg and is only taking daily, is taking famotidine 20 mg BID, doing well. Continue same.

## 2021-06-04 NOTE — Patient Instructions (Signed)
[facility] to schedule your [facility].   Please consider the colonoscopy or colon cancer screening as discussed.   Please schedule a follow up visit for 6 months for diabetes check.  It was a pleasure to see you today!  Preventive Care 25-59 Years Old, Female Preventive care refers to lifestyle choices and visits with your health care provider that can promote health and wellness. This includes: A yearly physical exam. This is also called an annual wellness visit. Regular dental and eye exams. Immunizations. Screening for certain conditions. Healthy lifestyle choices, such as: Eating a healthy diet. Getting regular exercise. Not using drugs or products that contain nicotine and tobacco. Limiting alcohol use. What can I expect for my preventive care visit? Physical exam Your health care provider will check your: Height and weight. These may be used to calculate your BMI (body mass index). BMI is a measurement that tells if you are at a healthy weight. Heart rate and blood pressure. Body temperature. Skin for abnormal spots. Counseling Your health care provider may ask you questions about your: Past medical problems. Family's medical history. Alcohol, tobacco, and drug use. Emotional well-being. Home life and relationship well-being. Sexual activity. Diet, exercise, and sleep habits. Work and work Statistician. Access to firearms. Method of birth control. Menstrual cycle. Pregnancy history. What immunizations do I need?  Vaccines are usually given at various ages, according to a schedule. Your health care provider will recommend vaccines for you based on your age, medicalhistory, and lifestyle or other factors, such as travel or where you work. What tests do I need? Blood tests Lipid and cholesterol levels. These may be checked every 5 years, or more often if you are over 85 years old. Hepatitis C test. Hepatitis B test. Screening Lung cancer screening. You  may have this screening every year starting at age 32 if you have a 30-pack-year history of smoking and currently smoke or have quit within the past 15 years. Colorectal cancer screening. All adults should have this screening starting at age 16 and continuing until age 15. Your health care provider may recommend screening at age 69 if you are at increased risk. You will have tests every 1-10 years, depending on your results and the type of screening test. Diabetes screening. This is done by checking your blood sugar (glucose) after you have not eaten for a while (fasting). You may have this done every 1-3 years. Mammogram. This may be done every 1-2 years. Talk with your health care provider about when you should start having regular mammograms. This may depend on whether you have a family history of breast cancer. BRCA-related cancer screening. This may be done if you have a family history of breast, ovarian, tubal, or peritoneal cancers. Pelvic exam and Pap test. This may be done every 3 years starting at age 70. Starting at age 17, this may be done every 5 years if you have a Pap test in combination with an HPV test. Other tests STD (sexually transmitted disease) testing, if you are at risk. Bone density scan. This is done to screen for osteoporosis. You may have this scan if you are at high risk for osteoporosis. Talk with your health care provider about your test results, treatment options,and if necessary, the need for more tests. Follow these instructions at home: Eating and drinking  Eat a diet that includes fresh fruits and vegetables, whole grains, lean protein, and low-fat dairy products. Take vitamin and mineral supplements as recommended by your health  care provider. Do not drink alcohol if: Your health care provider tells you not to drink. You are pregnant, may be pregnant, or are planning to become pregnant. If you drink alcohol: Limit how much you have to 0-1 drink a  day. Be aware of how much alcohol is in your drink. In the [place] one drink equals one 12 oz bottle of beer (355 mL), one 5 oz glass of wine (148 mL), or one 1 oz glass of hard liquor (44 mL).  Lifestyle Take daily care of your teeth and gums. Brush your teeth every morning and night with fluoride toothpaste. Floss one time each day. Stay active. Exercise for at least 30 minutes 5 or more days each week. Do not use any products that contain nicotine or tobacco, such as cigarettes, e-cigarettes, and chewing tobacco. If you need help quitting, ask your health care provider. Do not use drugs. If you are sexually active, practice safe sex. Use a condom or other form of protection to prevent STIs (sexually transmitted infections). If you do not wish to become pregnant, use a form of birth control. If you plan to become pregnant, see your health care provider for a prepregnancy visit. If told by your health care provider, take low-dose aspirin daily starting at age 66. Find healthy ways to cope with stress, such as: Meditation, yoga, or listening to music. Journaling. Talking to a trusted person. Spending time with friends and family. Safety Always wear your seat belt while driving or riding in a vehicle. Do not drive: If you have been drinking alcohol. Do not ride with someone who has been drinking. When you are tired or distracted. While texting. Wear a helmet and other protective equipment during sports activities. If you have firearms in your house, make sure you follow all gun safety procedures. What's next? Visit your health care provider once a year for an annual wellness visit. Ask your health care provider how often you should have your eyes and teeth checked. Stay up to date on all vaccines. This information is not intended to replace advice given to you by your health care provider. Make sure you discuss any questions you have with your healthcare provider. Document Revised:  08/06/2020 Document Reviewed: 07/14/2018 Elsevier Patient Education  2022 [employer].

## 2021-06-04 NOTE — Assessment & Plan Note (Addendum)
Following with cardiology and managed on Repatha every two weeks and fenofibrate 145 mg daily. Recent lipid panel results reviewed and stable.

## 2021-06-04 NOTE — Assessment & Plan Note (Signed)
Recent level borderline low, will resume monthly injections. Injection provided today, will send Rx for monthly injections. Repeat B12 during next visit.

## 2021-06-04 NOTE — Assessment & Plan Note (Signed)
Following with psychiatry, continue temazepam 30 mg, Lamictal 300 mg HS, hydroxyzine 50 mg TID PRN, Cymbalta 40 mg daily, desipramine 25 mg daily.

## 2021-06-04 NOTE — Assessment & Plan Note (Signed)
Doing well on temazepam 30 mg per psychiatry, continue same.

## 2021-06-04 NOTE — Assessment & Plan Note (Signed)
Following with neurology, no updates, is not on treatment at this time.

## 2021-06-04 NOTE — Telephone Encounter (Signed)
Error

## 2021-06-04 NOTE — Assessment & Plan Note (Signed)
Shingrix due, she will have this done at her pharmacy. Other vaccines UTD.   Mammogram overdue, orders placed. Colonoscopy overdue, declines colonoscopy and Cologuard despite recommendations.  Pap smear due, completed today.  Exam today stable. Labs reviewed and pending.

## 2021-06-04 NOTE — Progress Notes (Signed)
Subjective:    Patient ID: Emily Moon, female    DOB: 18-Jun-1961, 60 y.o.   MRN: [id number]  HPI  Emily Moon is a very pleasant 60 y.o. female who presents today for complete physical and follow up of chronic conditions.  Immunizations: -Influenza: due this season  -Covid-19: 4 vaccines -Shingles: Never completed, will get at pharmacy.  -Pneumonia: Prevnar 13 in 2015, pneumovax in 2016   Diet: [place].  Exercise: No regular exercise.  Eye exam: Completes annually  Dental exam: Completes semi-annually   Pap Smear: Completed in January 2019 Mammogram: Completed years ago, due.  Colonoscopy: Never completed, declines   BP Readings from Last 3 Encounters:  06/04/21 110/68  05/08/21 100/70  04/23/21 120/74       Review of Systems  Constitutional:  Negative for unexpected weight change.  HENT:  Negative for rhinorrhea.   Respiratory:  Negative for shortness of breath.   Cardiovascular:  Negative for chest pain.  Gastrointestinal:  Positive for constipation. Negative for diarrhea.  Genitourinary:  Negative for difficulty urinating.  Musculoskeletal:  Positive for arthralgias.  Skin:  Positive for rash.  Allergic/Immunologic: Negative for environmental allergies.  Neurological:  Negative for dizziness and headaches.  Psychiatric/Behavioral:         Overall feels well controlled regarding anxiety and depression, follows with psychiatry.         Past Medical History:  Diagnosis Date   Arthritis    osteo   Asthma    Bipolar disorder ([place]) 05/21/14   Cataracts, bilateral    DDD (degenerative disc disease), cervical    also back   Depression    Dizziness    Positional   Edema    feet/legs   Fibromyalgia syndrome    Fungal infection    Finger nails   GERD (gastroesophageal reflux disease)    Gout    Headache    seasonal allergies   Heart palpitations    Hip dysplasia, congenital 09/15/2013   Hypercholesterolemia    Multiple sclerosis  (HCC)    weakness   Nephrolithiasis    kidney stones   Osteoporosis    osteoarthritis   Pneumonia    PONV (postoperative nausea and vomiting)    no problem after cataract surgery   Prediabetes 07/22/2018   Psoriasis    Renal stone    Shortness of breath dyspnea    wheezing   Sleep apnea 2012   sleep study / slight, no interventions   Urinary frequency     Social History   Socioeconomic History   Marital status: Divorced    Spouse name: Not on file   Number of children: 1   Years of education: Not on file   Highest education level: Not on file  Occupational History   Occupation: Therapist, 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  Tobacco Use   Smoking status: Former    Packs/day: 1.00    Years: 25.00    Pack years: 25.00    Types: Cigarettes    Quit date: 2019    Years since quitting: 3.5   Smokeless tobacco: Never   Tobacco comments:    occasional use  Vaping Use   Vaping Use: Former  Substance and Sexual Activity   Alcohol use: No    Alcohol/week: 0.0 standard drinks   Drug use: No    Types: Methylphenidate   Sexual activity: Never  Other Topics Concern   Not on file  Social History Narrative  Not on file   Social Determinants of Health   Financial Resource Strain: Low Risk    Difficulty of Paying Living Expenses: Not hard at all  Food Insecurity: No Food Insecurity   Worried About Charity fundraiser in the Last Year: Never true   [place] in the Last Year: Never true  Transportation Needs: No Transportation Needs   Lack of Transportation (Medical): No   Lack of Transportation (Non-Medical): No  Physical Activity: Inactive   Days of Exercise per Week: 0 days   Minutes of Exercise per Session: 0 min  Stress: No Stress Concern Present   Feeling of Stress : Not at all  Social Connections: Not on file  Intimate Partner Violence: Not At Risk   Fear of Current or Ex-Partner: No   Emotionally Abused: No   Physically Abused: No   Sexually  Abused: No    Past Surgical History:  Procedure Laterality Date   CATARACT EXTRACTION W/PHACO Left 05/21/2015   Procedure: CATARACT EXTRACTION PHACO AND INTRAOCULAR LENS PLACEMENT (IOC);  Surgeon: Birder Robson, MD;  Location: [facility];  Service: Ophthalmology;  Laterality: Left;  [place] 00:35 AP% 22.9 CDE 8.11 fluid pack lot #[id number]   CATARACT EXTRACTION W/PHACO Right 06/04/2015   Procedure: CATARACT EXTRACTION PHACO AND INTRAOCULAR LENS PLACEMENT (IOC);  Surgeon: Birder Robson, MD;  Location: [facility];  Service: Ophthalmology;  Laterality: Right;  US:00:48 AP%: 10.5 CDE:5.08 Fluid lot #[id number]   CYSTOSCOPY/URETEROSCOPY/HOLMIUM LASER/STENT PLACEMENT Bilateral 09/22/2016   Procedure: CYSTOSCOPY/URETEROSCOPY/HOLMIUM LASER/STENT PLACEMENT;  Surgeon: Hollice Espy, MD;  Location: [facility];  Service: Urology;  Laterality: Bilateral;   EYE SURGERY  2015   tissue biopsy   FOOT SURGERY  2015   JOINT REPLACEMENT Left 2013   hip replacement   LITHOTRIPSY     PTOSIS REPAIR Bilateral 02/18/2016   Procedure: BILATERAL PTOSIS REPAIR UPPER EYELIDS;  Surgeon: Karle Starch, MD;  Location: [facility] [place];  Service: Ophthalmology;  Laterality: Bilateral;  LEAVE PT EARLY AM   thumb surgery Right    TONSILLECTOMY  1973    Family History  Problem Relation Age of Onset   Cancer Father        Abdomen with mastasis   Cancer Mother    Heart disease Mother    Kidney disease Neg Hx    Bladder Cancer Neg Hx    Prostate cancer Neg Hx    Kidney cancer Neg Hx     Allergies  Allergen Reactions   Albuterol Shortness Of Breath and Other (See Comments)    Makes pt feel jittery/ tacycardic   Crestor [Rosuvastatin] Other (See Comments)    Joint pain, muscle pain, and hair loss   Halcion [Triazolam] Other (See Comments)    Dizziness,headaches,bladder problems   Levaquin [Levofloxacin In D5w] Diarrhea and Itching    Shoulder pain   Naproxen Sodium Swelling    Patient tolerates in small doses    Tylenol [Acetaminophen] Swelling    Patient tolerates in small doses   Cefaclor Other (See Comments)    Doesn't remember---unsure if actually allergic    Sulfa Antibiotics Itching    Unsure of reaction possibly itching   Tramadol Itching and Nausea And Vomiting   Zinc     constipation    Aripiprazole Other (See Comments)    Muscle tension/cramping   Diclofenac Sodium Rash    "made very sick"   Ibuprofen Swelling    Patient tolerates in small doses    Current Outpatient Medications on  File Prior to Visit  Medication Sig Dispense Refill   ASPERCREME LIDOCAINE EX Apply 1 application topically 4 (four) times daily as needed (for pain.).      ASPIRIN 81 PO Take by mouth daily.     Azelastine HCl 137 MCG/SPRAY SOLN PLACE 2 SPRAYS INTO BOTH NOSTRILS 2 (TWO) TIMES DAILY 30 mL 2   BIOTIN PO Take 200 mg by mouth daily.      budesonide-formoterol (SYMBICORT) 80-4.5 MCG/ACT inhaler Inhale 2 puffs into the lungs 2 (two) times daily. 1 each 6   calcipotriene-betamethasone (TACLONEX) ointment Apply topically daily. 60 g 0   cetirizine (ZYRTEC) 10 MG tablet Take 10 mg by mouth 2 (two) times daily.     Cholecalciferol (VITAMIN D3) 10000 units capsule Take 30,000 Units by mouth daily.      ciclopirox (LOPROX) 0.77 % cream Apply topically 2 (two) times daily. 30 g 0   clindamycin (CLEOCIN T) 1 % external solution Apply 1-2 times daily to scalp as needed for itch. 60 mL 1   clobetasol (OLUX) 0.05 % topical foam Apply to affected areas at arms, legs, trunk twice a day up to 2 weeks. Avoid applying to face, groin, and axilla. Use as directed. Risk of skin atrophy with long-term use reviewed. 50 g 0   clobetasol (TEMOVATE) 0.05 % external solution Mix clobetasol solution with  CeraVe cream Use twice daily to affected areas.Avoid Face, groin and underarm 50 mL 0   Continuous Blood Gluc Sensor (FREESTYLE LIBRE 14 DAY SENSOR) MISC PLACE INTO SKIN FOR CONTINUOUS CHECK OF BLOOD SUGAR DAILY 2 each 2    cyanocobalamin (,VITAMIN B-12,) 1000 MCG/ML injection Inject 1 mL (1,000 mcg total) into the muscle every 3 (three) months. 1 mL 0   desipramine (NORPRAMIN) 25 MG tablet TAKE 1 TABLET BY MOUTH IN THE MORNING 30 tablet 7   diclofenac sodium (VOLTAREN) 1 % GEL Apply 4 g topically 4 (four) times daily. 500 g 5   docusate sodium (COLACE) 100 MG capsule Take 1 capsule (100 mg total) by mouth 2 (two) times daily. 60 capsule 0   Dulaglutide (TRULICITY) 1.5 YS/0.6TK SOPN Inject 1.5 mg into the skin once a week. For diabetes. 6 mL 2   DULoxetine (CYMBALTA) 20 MG capsule Take 2 capsules (40 mg total) by mouth daily. 180 capsule 2   Evolocumab (REPATHA SURECLICK) 160 MG/ML SOAJ Inject 1 Dose into the skin every 14 (fourteen) days. 2 mL 3   famotidine (PEPCID) 20 MG tablet TAKE 1 TABLET BY MOUTH EVERYDAY AT BEDTIME 90 tablet 3   fenofibrate (TRICOR) 145 MG tablet Take 1 tablet (145 mg total) by mouth daily. 30 tablet 0   fluocinonide (LIDEX) 0.05 % external solution Apply 1 application topically 2 (two) times daily as needed. Apply to scalp twice daily as need for itch 180 mL 3   hydrocortisone 2.5 % cream Apply BID to affected area in underarms and groin prn flares     hydroquinone 4 % cream 1 APPLICATION TOPICALLY DAILY AS NEEDED FOR BLEMISHES.  3   hydrOXYzine (VISTARIL) 50 MG capsule TAKE 1 CAPSLE BY MOUTH AT NOON, 1 AT 6 PM, AND 1 AS NEEDED 270 capsule 2   lamoTRIgine (LAMICTAL) 150 MG tablet Take 2 tablets (300 mg total) by mouth at bedtime. 180 tablet 1   levalbuterol (XOPENEX HFA) 45 MCG/ACT inhaler INHALE 1 TO 2 PUFFS BY MOUTH EVERY 6 HOURS AS NEEDED FOR WHEEZE 1 each 0   levalbuterol (XOPENEX) 1.25 MG/3ML nebulizer solution  USE 1 VIAL VIA NEBULIZER EVERY 4 HOURS AS NEEDED FOR WHEEZING OR SHORTNESS OF BREATH 270 mL 1   Lysine 1000 MG TABS Take 2,000-4,000 mg by mouth at bedtime. 2000 mg scheduled at bedtime and patient will take 4000 mg if she has outbreak     Magnesium 500 MG TABS Take 1 tablet by  mouth at bedtime.     meclizine (ANTIVERT) 25 MG tablet Take 0.5-1 tablets (12.5-25 mg total) by mouth 3 (three) times daily as needed for dizziness (sedation caution). 30 tablet 0   methocarbamol (ROBAXIN) 500 MG tablet TAKE 1-2 TABLETS (500-1,000 MG TOTAL) BY MOUTH DAILY AS NEEDED FOR MUSCLE SPASMS. 180 tablet 0   metoprolol succinate (TOPROL-XL) 50 MG 24 hr tablet Take 1 tablet (50 mg total) by mouth daily. Take with or immediately following a meal. 30 tablet 0   neomycin-bacitracin-polymyxin (NEOSPORIN) [phone] ointment Apply 1 application topically 4 (four) times daily as needed (for cut/scrapes.).     Olopatadine HCl 0.2 % SOLN 1 drop once daily     OVER THE COUNTER MEDICATION Delta 8 and Delta 9 gummies     pantoprazole (PROTONIX) 40 MG tablet Take 1 tablet (40 mg total) by mouth 2 (two) times daily. For heartburn 180 tablet 0   phenazopyridine (PYRIDIUM) 95 MG tablet Take 95 mg by mouth 3 (three) times daily as needed for pain.     polyethylene glycol powder (GLYCOLAX/MIRALAX) powder MIX 17 GRAMS (1 CAPFUL) WITH 4-8 OZ OF LIQUID AND TAKE BY MOUTH TWICE DAILY AS NEEDED 527 g 0   traZODone (DESYREL) 50 MG tablet Take 2 tablets (100 mg total) by mouth at bedtime. 60 tablet 7   valACYclovir (VALTREX) 1000 MG tablet Take 1 tablet (1,000 mg total) by mouth daily. For herpes outbreak prevention 90 tablet 0   No current facility-administered medications on file prior to visit.    Pulse 100   Temp (!) 96.2 F (35.7 C) (Temporal)   Ht 5\' 9"  (1.753 m)   Wt 240 lb (108.9 kg)   LMP 08/23/2014   SpO2 97%   BMI 35.44 kg/m  Objective:   Physical Exam HENT:     Right Ear: Tympanic membrane and ear canal normal.     Left Ear: Tympanic membrane and ear canal normal.     Nose: Nose normal.  Eyes:     Conjunctiva/sclera: Conjunctivae normal.     Pupils: Pupils are equal, round, and reactive to light.  Neck:     Thyroid: No thyromegaly.  Cardiovascular:     Rate and Rhythm: Normal rate and  regular rhythm.     Heart sounds: No murmur heard. Pulmonary:     Effort: Pulmonary effort is normal.     Breath sounds: Normal breath sounds. No rales.  Abdominal:     General: Bowel sounds are normal.     Palpations: Abdomen is soft.     Tenderness: There is no abdominal tenderness.  Genitourinary:    Labia:        Right: No tenderness or lesion.        Left: No tenderness or lesion.      Vagina: No vaginal discharge.     Cervix: No cervical motion tenderness or discharge.     Adnexa: Right adnexa normal and left adnexa normal.  Musculoskeletal:     Cervical back: Neck supple.     Right hip: Decreased range of motion.     Comments: Moderate difficulty getting up and down from exam  table. Walking with cane  Lymphadenopathy:     Cervical: No cervical adenopathy.  Skin:    General: Skin is warm and dry.     Findings: No rash.  Neurological:     Mental Status: She is alert and oriented to person, place, and time.     Cranial Nerves: No cranial nerve deficit.     Deep Tendon Reflexes: Reflexes are normal and symmetric.          Assessment & Plan:      This visit occurred during the SARS-CoV-2 public health emergency.  Safety protocols were in place, including screening questions prior to the visit, additional usage of staff PPE, and extensive cleaning of exam room while observing appropriate contact time as indicated for disinfecting solutions.

## 2021-06-04 NOTE — Assessment & Plan Note (Signed)
Compliant to vitamin D, is taking 3000 IU daily.  Continue same.

## 2021-06-04 NOTE — Assessment & Plan Note (Signed)
Follows with dermatology, using clobetasol foam 0.05% and Taclonex ointment PRN. Not using clobetasol solution at this time. Continue same.

## 2021-06-04 NOTE — Assessment & Plan Note (Signed)
Following with pulmonology, using Symbicort mostly once daily as she forgets, encouraged BID use. Infrequent use of albuterol inhaler.   Continue Symbicort BID and albuterol PRN.

## 2021-06-04 NOTE — Assessment & Plan Note (Signed)
Compliant to CPAP most nights, continue same.

## 2021-06-04 NOTE — Assessment & Plan Note (Signed)
Improving on daily valacyclovir 1000 mg daily.  Continue same.

## 2021-06-04 NOTE — Addendum Note (Signed)
Addended by: Francella Solian on: 06/04/2021 03:03 PM   Modules accepted: Orders

## 2021-06-04 NOTE — Assessment & Plan Note (Signed)
Chronic to right hip, is pending right total hip replacement on 07/02/21.

## 2021-06-04 NOTE — Addendum Note (Signed)
Addended by: [name] on: 06/04/2021 02:57 PM   Modules accepted: Orders

## 2021-06-04 NOTE — Assessment & Plan Note (Addendum)
A1C today of 6.3! Improved from last visit and since dose increase of Trulicity to 1.5 mg weekly.  Continue Trulicity 1.5 mg weekly.  Statin intolerant.  Urine microalbumin UTD.   Follow up in 6 months.

## 2021-06-04 NOTE — Assessment & Plan Note (Signed)
Doing well on PRN Meclizine 25 mg, continue same.

## 2021-06-05 ENCOUNTER — Ambulatory Visit (INDEPENDENT_AMBULATORY_CARE_PROVIDER_SITE_OTHER): Payer: PPO

## 2021-06-05 DIAGNOSIS — R0602 Shortness of breath: Secondary | ICD-10-CM | POA: Diagnosis not present

## 2021-06-05 DIAGNOSIS — Z01810 Encounter for preprocedural cardiovascular examination: Secondary | ICD-10-CM

## 2021-06-06 ENCOUNTER — Ambulatory Visit: Payer: PPO | Admitting: Cardiology

## 2021-06-06 LAB — ECHOCARDIOGRAM COMPLETE
AR max vel: 2.87 cm2
AV Area VTI: 2.93 cm2
AV Area mean vel: 2.66 cm2
AV Mean grad: 4 mmHg
AV Peak grad: 6.7 mmHg
Ao pk vel: 1.29 m/s
Area-P 1/2: 3.56 cm2
Calc EF: 54.3 %
S' Lateral: 2.5 cm
Single Plane A2C EF: 51.1 %
Single Plane A4C EF: 57 %

## 2021-06-06 LAB — CYTOLOGY - PAP
Comment: NEGATIVE
Diagnosis: NEGATIVE
High risk HPV: NEGATIVE

## 2021-06-10 NOTE — Telephone Encounter (Signed)
Did you know about this?

## 2021-06-10 NOTE — Telephone Encounter (Signed)
I asked patient to call them to send [place] a preop clearance form. See message.

## 2021-06-12 ENCOUNTER — Ambulatory Visit: Payer: PPO | Admitting: Dermatology

## 2021-06-16 ENCOUNTER — Telehealth: Payer: Self-pay | Admitting: Internal Medicine

## 2021-06-16 ENCOUNTER — Telehealth: Payer: Self-pay | Admitting: Physician Assistant

## 2021-06-16 NOTE — Telephone Encounter (Signed)
Spoke with Thereasa Parkin per release form and she will have patient call us to review results.

## 2021-06-16 NOTE — Telephone Encounter (Signed)
Visser, Jacquelyn D, PA-C  P Cv Div [facility] Triage Good news!  Echo shows:  --Normal pump function or heart squeeze.  --Walls of the heart are moving normally  --Walls are slightly slow to relax or slightly stiff, which can sometimes be associated with elevated BP, so we will continue to control this moving forward.  --Top left chamber is slightly larger than normal, which can sometimes be associated with sleep apnea.   Overall, very reassuring echo. Acceptable to proceed with surgery

## 2021-06-16 NOTE — Telephone Encounter (Signed)
Appt scheduled for 06/27/2021 at 11:30 for surgical clearance.  Contacted [facility] and requested surgical clearance form to be faxed to [facility] office.  Nothing further needed at this time.

## 2021-06-16 NOTE — Telephone Encounter (Signed)
Spoke with patient and reviewed results and recommendations. She does wear CPAP and has not had any issues with her blood pressures. She did inquire if the enlargement could be from her MS. Advised that when she comes in next week for her appointment she can review in more detail with provider. She was very appreciative for the call with no further questions at this time. Updated her DPR list per her request.

## 2021-06-16 NOTE — Telephone Encounter (Signed)
Called the patient tot give echo results. Unable to lmtcb. Patients voicemail is full.

## 2021-06-17 ENCOUNTER — Other Ambulatory Visit: Payer: Self-pay

## 2021-06-17 MED ORDER — FENOFIBRATE 145 MG PO TABS: 145.0000 mg | ORAL_TABLET | Freq: Every day | ORAL | 0 refills | Status: DC

## 2021-06-17 NOTE — Telephone Encounter (Signed)
Have you received this form yet?  

## 2021-06-17 NOTE — Telephone Encounter (Signed)
I do not see form. Please have them send [place] the form to prevent a delay in her surgery.

## 2021-06-18 ENCOUNTER — Other Ambulatory Visit: Payer: Self-pay | Admitting: Primary Care

## 2021-06-18 DIAGNOSIS — R7303 Prediabetes: Secondary | ICD-10-CM

## 2021-06-18 NOTE — Telephone Encounter (Signed)
Called office they will fax over form now. Spoke to Amy she is sending now.

## 2021-06-19 DIAGNOSIS — R21 Rash and other nonspecific skin eruption: Secondary | ICD-10-CM | POA: Diagnosis not present

## 2021-06-19 DIAGNOSIS — E119 Type 2 diabetes mellitus without complications: Secondary | ICD-10-CM | POA: Diagnosis not present

## 2021-06-19 DIAGNOSIS — M1631 Unilateral osteoarthritis resulting from hip dysplasia, right hip: Secondary | ICD-10-CM | POA: Diagnosis not present

## 2021-06-19 DIAGNOSIS — Q6589 Other specified congenital deformities of hip: Secondary | ICD-10-CM | POA: Diagnosis not present

## 2021-06-19 NOTE — Progress Notes (Signed)
DUE TO COVID-19 ONLY ONE VISITOR IS ALLOWED TO COME WITH YOU AND STAY IN THE WAITING ROOM ONLY DURING PRE OP AND PROCEDURE DAY OF SURGERY.  2 VISITOR  MAY VISIT WITH YOU AFTER SURGERY IN YOUR PRIVATE ROOM DURING VISITING HOURS ONLY!  YOU NEED TO HAVE A COVID 19 TEST ON_  06/30/2021 _____'@_'$  '@_from'$  8am-3pm _____, THIS TEST MUST BE DONE BEFORE SURGERY,  Covid test is done at [place], [place] [address].  This is a drive thru.  No appt required. Please see map.                 Your procedure is scheduled on:         07/02/21   Report to [facility] Main  Entrance   Report to admitting at      0920am     Call this number if you have problems the morning of surgery [phone]    REMEMBER: NO  SOLID FOOD CANDY OR GUM AFTER MIDNIGHT. CLEAR LIQUIDS UNTIL   0845am        . NOTHING BY MOUTH EXCEPT CLEAR LIQUIDS UNTIL  0845am   . PLEASE FINISH ENSURE DRINK PER SURGEON ORDER  WHICH NEEDS TO BE COMPLETED AT  0845am     .      CLEAR LIQUID DIET   Foods Allowed                                                                    Coffee and tea, regular and decaf                            Fruit ices (not with fruit pulp)                                      Iced Popsicles                                    Carbonated beverages, regular and diet                                    Cranberry, grape and apple juices Sports drinks like Gatorade Lightly seasoned clear broth or consume(fat free) Sugar, honey syrup ___________________________________________________________________      BRUSH YOUR TEETH MORNING OF SURGERY AND RINSE YOUR MOUTH OUT, NO CHEWING GUM CANDY OR MINTS.     Take these medicines the morning of surgery with A SIP OF WATER:   PRotonix, pepcid, torpol, cymbalta, inhalers as usual and bring, zyrtec     DO NOT TAKE ANY DIABETIC MEDICATIONS DAY OF YOUR SURGERY                               You may not have any metal on your body including hair pins and               piercings  Do not  wear jewelry, make-up, lotions, powders or perfumes, deodorant             Do not wear nail polish on your fingernails.  Do not shave  48 hours prior to surgery.              Men may shave face and neck.   Do not bring valuables to the hospital. [place].  Contacts, dentures or bridgework may not be worn into surgery.  Leave suitcase in the car. After surgery it may be brought to your room.     Patients discharged the day of surgery will not be allowed to drive home. IF YOU ARE HAVING SURGERY AND GOING HOME THE SAME DAY, YOU MUST HAVE AN ADULT TO DRIVE YOU HOME AND BE WITH YOU FOR 24 HOURS. YOU MAY GO HOME BY TAXI OR UBER OR ORTHERWISE, BUT AN ADULT MUST ACCOMPANY YOU HOME AND STAY WITH YOU FOR 24 HOURS.  Name and phone number of your driver:  Special Instructions: N/A              Please read over the following fact sheets you were given: _____________________________________________________________________  [facility] - Preparing for Surgery Before surgery, you can play an important role.  Because skin is not sterile, your skin needs to be as free of germs as possible.  You can reduce the number of germs on your skin by washing with CHG (chlorahexidine gluconate) soap before surgery.  CHG is an antiseptic cleaner which kills germs and bonds with the skin to continue killing germs even after washing. Please DO NOT use if you have an allergy to CHG or antibacterial soaps.  If your skin becomes reddened/irritated stop using the CHG and inform your nurse when you arrive at Short Stay. Do not shave (including legs and underarms) for at least 48 hours prior to the first CHG shower.  You may shave your face/neck. Please follow these instructions carefully:  1.  Shower with CHG Soap the night before surgery and the  morning of Surgery.  2.  If you choose to wash your hair, wash your hair first as usual with your   normal  shampoo.  3.  After you shampoo, rinse your hair and body thoroughly to remove the  shampoo.                           4.  Use CHG as you would any other liquid soap.  You can apply chg directly  to the skin and wash                       Gently with a scrungie or clean washcloth.  5.  Apply the CHG Soap to your body ONLY FROM THE NECK DOWN.   Do not use on face/ open                           Wound or open sores. Avoid contact with eyes, ears mouth and genitals (private parts).                       Wash face,  Genitals (private parts) with your normal soap.             6.  Wash thoroughly, paying  special attention to the area where your surgery  will be performed.  7.  Thoroughly rinse your body with warm water from the neck down.  8.  DO NOT shower/wash with your normal soap after using and rinsing off  the CHG Soap.                9.  Pat yourself dry with a clean towel.            10.  Wear clean pajamas.            11.  Place clean sheets on your bed the night of your first shower and do not  sleep with pets. Day of Surgery : Do not apply any lotions/deodorants the morning of surgery.  Please wear clean clothes to the hospital/surgery center.  FAILURE TO FOLLOW THESE INSTRUCTIONS MAY RESULT IN THE CANCELLATION OF YOUR SURGERY PATIENT SIGNATURE_________________________________  NURSE SIGNATURE__________________________________  ________________________________________________________________________

## 2021-06-21 NOTE — Progress Notes (Signed)
Cardiology Office Note    Date:  06/26/2021   ID:  Emily Moon, DOB [birth year], MRN [id number]  PCP:  [name], NP  Cardiologist:  [name] Sable, MD  Electrophysiologist:  None   Chief Complaint: Cardiac risk stratification  History of Present Illness:   Emily Moon is a 60 y.o. female with history of inappropriate sinus tachycardia, multiple sclerosis, DM2, HLD with statin intolerance on PCSK9i, bipolar, asthma, obstructive pulmonary disease, prior tobacco use x15 years, fibromyalgia, obesity, OSA on CPAP, DDD, congenital hip dysplasia, and GERD who presents for cardiac risk stratification.  Prior calcium score of 0 in 02/2020.  She was diagnosed with inappropriate sinus tachycardia in 05/2020 and started on metoprolol with improvement in symptoms.  She was evaluated in 04/2021 for preoperative cardiac risk ratification for planned upcoming total hip arthroplasty.  She was under increased stress and in the setting did note an increase in palpitations.  Given underlying shortness of breath she underwent echo on 06/05/2021 which showed an EF of 60 to 65%, no regional wall motion abnormalities, grade 1 diastolic dysfunction, normal RV systolic function and ventricular cavity size, no significant valvular abnormalities, and a mildly dilated left atrium.  She was felt to be acceptable risk for noncardiac surgery.  She comes in doing well from a cardiac perspective without symptoms concerning for frank angina, worsening dyspnea, dizziness, presyncope, or syncope.  She is tolerating Toprol-XL without issues.  Her functional status is quite limited secondary to severe right hip pain, for which she is scheduled for total hip arthroplasty later this month.  Given significant functional status limitations her diet has declined, eating more convenience foods and having people cook for her.  She has been eating THC Gummies to assist with the hip pain.  Currently without any cardiac  limitations.   Labs independently reviewed: 05/2021 - A1c 6.3 02/2021 - TC 159, TG 189, HDL 39, LDL 81 08/2020 - potassium 4.2, BUN 23, serum creatinine 0.85 02/2020 - TSH normal, albumin 4.5, AST/ALT normal, Hgb 14.2, PLT 352  Past Medical History:  Diagnosis Date   Arthritis    osteo   Asthma    Bipolar disorder ([place]) 05/21/2014   Cataracts, bilateral    COPD (chronic obstructive pulmonary disease) (HCC)    DDD (degenerative disc disease), cervical    also back   Depression    Diabetes mellitus without complication (HCC)    Dizziness    Positional   Edema    feet/legs   Fibromyalgia syndrome    Fungal infection    Finger nails   GERD (gastroesophageal reflux disease)    Gout    Heart palpitations    Hip dysplasia, congenital 09/15/2013   History of kidney stones    Hypercholesterolemia    Multiple sclerosis (HCC)    weakness   Osteoporosis    osteoarthritis   Pneumonia    PONV (postoperative nausea and vomiting)    no problem after cataract surgery   Prediabetes 07/22/2018   Psoriasis    Shortness of breath dyspnea    wheezing   Sleep apnea 2012   sleep study / slight, no interventions   Urinary frequency    Weight gain 06/21/2014    Past Surgical History:  Procedure Laterality Date   CATARACT EXTRACTION W/PHACO Left 05/21/2015   Procedure: CATARACT EXTRACTION PHACO AND INTRAOCULAR LENS PLACEMENT ([place]);  Surgeon: Birder Robson, MD;  Location: [facility] ORS;  Service: Ophthalmology;  Laterality: Left;  [place] 00:35 AP% 22.9 CDE  8.11 fluid pack lot [id number]   CATARACT EXTRACTION W/PHACO Right 06/04/2015   Procedure: CATARACT EXTRACTION PHACO AND INTRAOCULAR LENS PLACEMENT ([facility]);  Surgeon: Birder Robson, MD;  Location: [facility] ORS;  Service: Ophthalmology;  Laterality: Right;  US:00:48 AP%: 10.5 CDE:5.08 Fluid lot [id number]   CYSTOSCOPY/URETEROSCOPY/HOLMIUM LASER/STENT PLACEMENT Bilateral 09/22/2016   Procedure: CYSTOSCOPY/URETEROSCOPY/HOLMIUM LASER/STENT PLACEMENT;   Surgeon: Hollice Espy, MD;  Location: [facility] ORS;  Service: Urology;  Laterality: Bilateral;   EYE SURGERY  2015   tissue biopsy   FOOT SURGERY  2015   JOINT REPLACEMENT Left 2013   hip replacement   LITHOTRIPSY     PTOSIS REPAIR Bilateral 02/18/2016   Procedure: BILATERAL PTOSIS REPAIR UPPER EYELIDS;  Surgeon: Karle Starch, MD;  Location: [facility];  Service: Ophthalmology;  Laterality: Bilateral;  LEAVE PT EARLY AM   thumb surgery Right    TONSILLECTOMY  1973    Current Medications: Current Meds  Medication Sig   ASPERCREME LIDOCAINE EX Apply 1 application topically 4 (four) times daily as needed (for pain.).    ASPIRIN 81 PO Take by mouth daily.   Azelastine HCl 137 MCG/SPRAY SOLN PLACE 2 SPRAYS INTO BOTH NOSTRILS 2 (TWO) TIMES DAILY   BIOTIN PO Take 200 mg by mouth daily.    budesonide-formoterol (SYMBICORT) 80-4.5 MCG/ACT inhaler Inhale 2 puffs into the lungs 2 (two) times daily.   calcipotriene-betamethasone (TACLONEX) ointment Apply topically daily.   cetirizine (ZYRTEC) 10 MG tablet Take 10 mg by mouth 2 (two) times daily.   Cholecalciferol (VITAMIN D3) 10000 units capsule Take 30,000 Units by mouth daily.    ciclopirox (LOPROX) 0.77 % cream Apply topically 2 (two) times daily.   clindamycin (CLEOCIN T) 1 % external solution Apply 1-2 times daily to scalp as needed for itch.   clobetasol (OLUX) 0.05 % topical foam Apply to affected areas at arms, legs, trunk twice a day up to 2 weeks. Avoid applying to face, groin, and axilla. Use as directed. Risk of skin atrophy with long-term use reviewed.   clobetasol (TEMOVATE) 0.05 % external solution Mix clobetasol solution with  CeraVe cream Use twice daily to affected areas.Avoid Face, groin and underarm   Continuous Blood Gluc Sensor (FREESTYLE LIBRE 14 DAY SENSOR) MISC PLACE INTO SKIN FOR CONTINUOUS CHECK OF BLOOD SUGAR DAILY   cyanocobalamin (,VITAMIN B-12,) 1000 MCG/ML injection Inject 1 mL (1,000 mcg total) into the muscle  every 30 (thirty) days. For B12 vitamin   desipramine (NORPRAMIN) 25 MG tablet TAKE 1 TABLET BY MOUTH IN THE MORNING   diclofenac sodium (VOLTAREN) 1 % GEL Apply 4 g topically 4 (four) times daily.   docusate sodium (COLACE) 100 MG capsule Take 1 capsule (100 mg total) by mouth 2 (two) times daily.   Dulaglutide (TRULICITY) 1.5 0000000 SOPN Inject 1.5 mg into the skin once a week. For diabetes.   DULoxetine (CYMBALTA) 20 MG capsule Take 2 capsules (40 mg total) by mouth daily.   Evolocumab (REPATHA SURECLICK) [id number] MG/ML SOAJ Inject 1 Dose into the skin every 14 (fourteen) days.   famotidine (PEPCID) 20 MG tablet Take 1 tablet (20 mg total) by mouth 2 (two) times daily.   fenofibrate (TRICOR) 145 MG tablet Take 1 tablet (145 mg total) by mouth daily.   fluocinonide (LIDEX) 0.05 % external solution Apply 1 application topically 2 (two) times daily as needed. Apply to scalp twice daily as need for itch   hydrocortisone 2.5 % cream Apply BID to affected area in underarms and groin prn  flares   hydroquinone 4 % cream 1 APPLICATION TOPICALLY DAILY AS NEEDED FOR BLEMISHES.   hydrOXYzine (VISTARIL) 50 MG capsule TAKE 1 CAPSLE BY MOUTH AT NOON, 1 AT 6 PM, AND 1 AS NEEDED   lamoTRIgine (LAMICTAL) 150 MG tablet Take 2 tablets (300 mg total) by mouth at bedtime.   levalbuterol (XOPENEX HFA) 45 MCG/ACT inhaler INHALE 1 TO 2 PUFFS BY MOUTH EVERY 6 HOURS AS NEEDED FOR WHEEZE   levalbuterol (XOPENEX) 1.25 MG/3ML nebulizer solution USE 1 VIAL VIA NEBULIZER EVERY 4 HOURS AS NEEDED FOR WHEEZING OR SHORTNESS OF BREATH   Lysine 1000 MG TABS Take 2,000-4,000 mg by mouth at bedtime. 2000 mg scheduled at bedtime and patient will take 4000 mg if she has outbreak   Magnesium 500 MG TABS Take 1 tablet by mouth at bedtime.   meclizine (ANTIVERT) 25 MG tablet Take 0.5-1 tablets (12.5-25 mg total) by mouth 3 (three) times daily as needed for dizziness (sedation caution).   methocarbamol (ROBAXIN) 500 MG tablet TAKE 1-2 TABLETS  (500-1,000 MG TOTAL) BY MOUTH DAILY AS NEEDED FOR MUSCLE SPASMS.   metoprolol succinate (TOPROL-XL) 50 MG 24 hr tablet Take 1 tablet (50 mg total) by mouth daily. Take with or immediately following a meal.   neomycin-bacitracin-polymyxin (NEOSPORIN) [phone] ointment Apply 1 application topically 4 (four) times daily as needed (for cut/scrapes.).   OVER THE COUNTER MEDICATION Delta 8 and Delta 9 gummies   pantoprazole (PROTONIX) 40 MG tablet Take 1 tablet (40 mg total) by mouth daily. For heartburn   phenazopyridine (PYRIDIUM) 95 MG tablet Take 95 mg by mouth 3 (three) times daily as needed for pain.   polyethylene glycol powder (GLYCOLAX/MIRALAX) powder MIX 17 GRAMS (1 CAPFUL) WITH 4-8 OZ OF LIQUID AND TAKE BY MOUTH TWICE DAILY AS NEEDED   SYRINGE/NEEDLE, DISP, 1 ML 23G X 1" 1 ML MISC Use as directed   temazepam (RESTORIL) 30 MG capsule TAKE ONE TO TWO CAPSULES BY MOUTH NIGHTLY AT BEDTIME   traZODone (DESYREL) 50 MG tablet Take 2 tablets (100 mg total) by mouth at bedtime.   valACYclovir (VALTREX) 1000 MG tablet Take 1 tablet (1,000 mg total) by mouth daily. 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  Tobacco Use   Smoking status: Former    Packs/day: 1.00    Years: 25.00    Pack years: 25.00    Types: Cigarettes    Quit date: 2019    Years since quitting: 3.6   Smokeless tobacco: Never   Tobacco comments:    occasional use  Vaping Use   Vaping Use: Former  Substance and  Sexual Activity   Alcohol use: No    Alcohol/week: 0.0 standard drinks   Drug use: Yes    Types: Methylphenidate, Marijuana    Comment: Delta 8 - once per day   Sexual activity: Never  Other Topics Concern   Not on file  Social History Narrative   Not on file   Social Determinants of Health   Financial  Resource Strain: Low Risk    Difficulty of Paying Living Expenses: Not hard at all  Food Insecurity: No Food Insecurity   Worried About Charity fundraiser in the Last Year: Never true   Ran Out of Food in the Last Year: Never true  Transportation Needs: No Transportation Needs   Lack of Transportation (Medical): No   Lack of Transportation (Non-Medical): No  Physical Activity: Inactive   Days of Exercise per Week: 0 days   Minutes of Exercise per Session: 0 min  Stress: No Stress Concern Present   Feeling of Stress : Not at all  Social Connections: Not on file     Family History:  The patient's family history includes Cancer in her father and mother; Heart disease in her mother. There is no history of Kidney disease, Bladder Cancer, Prostate cancer, or Kidney cancer.  ROS:   Review of Systems  Constitutional:  Positive for malaise/fatigue. Negative for chills, diaphoresis, fever and weight loss.  HENT:  Negative for congestion.   Eyes:  Negative for discharge and redness.  Respiratory:  Positive for shortness of breath. Negative for cough, sputum production and wheezing.   Cardiovascular:  Negative for chest pain, palpitations, orthopnea, claudication, leg swelling and PND.  Gastrointestinal:  Negative for abdominal pain, heartburn, nausea and vomiting.  Musculoskeletal:  Positive for falls and joint pain. Negative for myalgias.  Skin:  Negative for rash.  Neurological:  Negative for dizziness, tingling, tremors, sensory change, speech change, focal weakness, loss of consciousness and weakness.  Endo/Heme/Allergies:  Does not bruise/bleed easily.  Psychiatric/Behavioral:   Negative for substance abuse. The patient is not nervous/anxious.   All other systems reviewed and are negative.   EKGs/Labs/Other Studies Reviewed:    Studies reviewed were summarized above. The additional studies were reviewed today:  2D echo 06/05/2021: 1. Left ventricular ejection fraction, by estimation, is 60 to 65%. The  left ventricle has normal function. The left ventricle has no regional  wall motion abnormalities. Left ventricular diastolic parameters are  consistent with Grade I diastolic  dysfunction (impaired relaxation).   2. Right ventricular systolic function is normal. The right ventricular  size is normal. Tricuspid regurgitation signal is inadequate for assessing  PA pressure.   3. Left atrial size was mildly dilated.  __________  Calcium score 03/11/2020: IMPRESSION: Coronary calcium score of 0.   EKG:  EKG is ordered today.  The EKG ordered today demonstrates sinus tachycardia, 101 bpm, axis deviation, no acute ST-T changes, when compared to prior tracing no significant changes  Recent Labs: 06/23/2021: ALT 44; BUN 25; Creatinine, Ser 0.93; Hemoglobin 13.2; Platelets 492; Potassium 4.4; Sodium 141  Recent Lipid Panel    Component Value Date/Time   CHOL 159 03/10/2021 1628   TRIG 189.0 (H) 03/10/2021 1628   HDL 39.90 03/10/2021 1628   CHOLHDL 4 03/10/2021 1628   VLDL 37.8 03/10/2021 1628   LDLCALC 81 03/10/2021 1628   LDLDIRECT 53.0 08/01/2020 1254    PHYSICAL EXAM:    VS:  BP 130/70 (BP Location: Right Arm, Patient Position: Sitting, Cuff Size: Normal)   Pulse (!) 101   Ht 5' 8.5" (1.74 m)   Wt 239 lb (108.4 kg)   LMP 08/23/2014   SpO2 98%   BMI 35.81 kg/m   BMI: Body mass index is 35.81 kg/m.  Physical Exam Constitutional:      Appearance: She is well-developed.  HENT:     Head: Normocephalic and atraumatic.  Eyes:  General:        Right eye: No discharge.        Left eye: No discharge.  Neck:     Vascular: No JVD.  Cardiovascular:      Rate and Rhythm: Normal rate and regular rhythm.     Pulses:          Dorsalis pedis pulses are 2+ on the right side and 2+ on the left side.       Posterior tibial pulses are 2+ on the right side and 2+ on the left side.     Heart sounds: Normal heart sounds, S1 normal and S2 normal. Heart sounds not distant. No midsystolic click and no opening snap. No murmur heard.   No friction rub.  Pulmonary:     Effort: Pulmonary effort is normal. No respiratory distress.     Breath sounds: Normal breath sounds. No decreased breath sounds, wheezing or rales.  Chest:     Chest wall: No tenderness.  Abdominal:     General: There is no distension.     Palpations: Abdomen is soft.     Tenderness: There is no abdominal tenderness.  Musculoskeletal:     Cervical back: Normal range of motion.     Right lower leg: No edema.     Left lower leg: No edema.  Skin:    General: Skin is warm and dry.     Nails: There is no clubbing.  Neurological:     Mental Status: She is alert and oriented to person, place, and time.  Psychiatric:        Speech: Speech normal.        Behavior: Behavior normal.        Thought Content: Thought content normal.        Judgment: Judgment normal.    Wt Readings from Last 3 Encounters:  06/26/21 239 lb (108.4 kg)  06/04/21 240 lb (108.9 kg)  05/08/21 246 lb (111.6 kg)     ASSESSMENT & PLAN:   Inappropriate sinus tachycardia: Symptoms well controlled on Toprol-XL.  If added heart rate control is needed in the perioperative setting could briefly titrate Toprol-XL as BP allows.  Preoperative cardiac risk stratification: Scheduled to undergo a total hip arthroplasty on 07/02/2021.  Per Revised Cardiac Risk Index, she is low risk for noncardiac surgery.  Overall, her functional status is somewhat difficult to assess secondary to her comorbid conditions including severe hip pain, MS, obesity, and physical deconditioning.  However, it does appear per [facility] Activity Status  Index she is able to achieve at least 4 METs without cardiac limitation.  She may proceed with noncardiac surgery, from a cardiac perspective, without further cardiac testing at an overall low risk.  Note has been faxed to the performing surgeon.  Dyspnea with underlying obstructive pulmonary disease: Recent echo demonstrated preserved LV systolic function with only grade 1 diastolic dysfunction and no significant valvular abnormalities.  Recent calcium score of 0.  From a cardiac perspective, she may proceed with noncardiac surgery as outlined above.  Follow-up with pulmonology as directed.  HLD: LDL 81.  Currently on Repatha.  In the setting of functional status limitation with severe hip pain her diet has worsened.  She plans to improve this in the postoperative setting with improved hip pain.  In this setting, we have deferred repeating a lipid panel at this time with recommendation to check a fasting lipid panel when she is seen in follow-up.  OSA: Continue CPAP.  Disposition:  F/u with Dr. Garen Lah or an APP in 4 months.   Medication Adjustments/Labs and Tests Ordered: Current medicines are reviewed at length with the patient today.  Concerns regarding medicines are outlined above. Medication changes, Labs and Tests ordered today are summarized above and listed in the Patient Instructions accessible in Encounters.   Signed, Christell Faith, PA-C 06/26/2021 12:05 PM     [address], [place] [phone]

## 2021-06-23 ENCOUNTER — Encounter (HOSPITAL_COMMUNITY)
Admission: RE | Admit: 2021-06-23 | Discharge: 2021-06-23 | Disposition: A | Discharge status: Home / Self Care | Payer: PPO | Source: Ambulatory Visit | Attending: Orthopedic Surgery | Admitting: Orthopedic Surgery

## 2021-06-23 ENCOUNTER — Other Ambulatory Visit: Payer: Self-pay

## 2021-06-23 ENCOUNTER — Encounter (HOSPITAL_COMMUNITY): Payer: Self-pay

## 2021-06-23 DIAGNOSIS — Z01812 Encounter for preprocedural laboratory examination: Secondary | ICD-10-CM | POA: Insufficient documentation

## 2021-06-23 HISTORY — DX: Type 2 diabetes mellitus without complications: E11.9

## 2021-06-23 HISTORY — DX: Personal history of urinary calculi: Z87.442

## 2021-06-23 HISTORY — DX: Chronic obstructive pulmonary disease, unspecified: J44.9

## 2021-06-23 LAB — COMPREHENSIVE METABOLIC PANEL
ALT: 44 U/L (ref 0–44)
AST: 24 U/L (ref 15–41)
Albumin: 4.8 g/dL (ref 3.5–5.0)
Alkaline Phosphatase: 44 U/L (ref 38–126)
Anion gap: 11 (ref 5–15)
BUN: 25 mg/dL — ABNORMAL HIGH (ref 6–20)
CO2: 24 mmol/L (ref 22–32)
Calcium: 9.4 mg/dL (ref 8.9–10.3)
Chloride: 106 mmol/L (ref 98–111)
Creatinine, Ser: 0.93 mg/dL (ref 0.44–1.00)
GFR, Estimated: >60 mL/min (ref >60)
Glucose, Bld: 102 mg/dL — ABNORMAL HIGH (ref 70–99)
Potassium: 4.4 mmol/L (ref 3.5–5.1)
Sodium: 141 mmol/L (ref 135–145)
Total Bilirubin: 0.5 mg/dL (ref 0.3–1.2)
Total Protein: 7.7 g/dL (ref 6.5–8.1)

## 2021-06-23 LAB — SURGICAL PCR SCREEN
MRSA, PCR: NEGATIVE
Staphylococcus aureus: NEGATIVE

## 2021-06-23 LAB — CBC
HCT: 40.1 % (ref 36.0–46.0)
Hemoglobin: 13.2 g/dL (ref 12.0–15.0)
MCH: 30.6 pg (ref 26.0–34.0)
MCHC: 32.9 g/dL (ref 30.0–36.0)
MCV: 92.8 fL (ref 80.0–100.0)
Platelets: 492 10*3/uL — ABNORMAL HIGH (ref 150–400)
RBC: 4.32 MIL/uL (ref 3.87–5.11)
RDW: 14.9 % (ref 11.5–15.5)
WBC: 9.3 10*3/uL (ref 4.0–10.5)
nRBC: 0 % (ref 0.0–0.2)

## 2021-06-23 LAB — GLUCOSE, CAPILLARY: Glucose-Capillary: 109 mg/dL — ABNORMAL HIGH (ref 70–99)

## 2021-06-23 LAB — PROTIME-INR
INR: 1 (ref 0.8–1.2)
Prothrombin Time: 12.7 seconds (ref 11.4–15.2)

## 2021-06-23 NOTE — Progress Notes (Addendum)
Anesthesia Review:  PCP: Spencer Copland- LOV 05/08/21  Cardiologist : Malachi Bonds Visser,PA  LOV note on chart dated 04/18/21 for clearance  Dr Garen Lah - cardiologist  Pulmonary- 01/10/21 LOV- Tammy Parrett,NP  [place] 02/26/21  Chest x-ray : EKG :04/18/21  Echo :06/06/21  Stress test: Cardiac Cath :  Activity level:  can do a flight of stairs withoiut difficulty  Sleep Study/ CPAP : 08/14/20 sleep test- has cpap  Fasting Blood Sugar :      / Checks Blood Sugar -- times a day:   Blood Thinner/ Instructions /Last Dose: ASA / Instructions/ Last Dose :   DM- type 2 has Freestyle Libre - checks glucose several times per day  Hgba1c-06/04/21-6.3  81 mg Aspirin  Pt using Delta 6 Tetrahydracannabinol for pain PT reports MD is aware. Made Audria Nine aware.   PT has MS  Pt's temp at preop was 100.0 initially then 99.5 then 99.7.  NO symptoms of Covid per pt .  PT reports running no fevers at home. Audria Nine made aware.  NO new orders given  Spoke with pt on 06/26/21.  Pt aware she will have to take off Glucose sensor prior to surgery and pt also aware she will need to stop Delta 8 at least 24 hours prior to surgery.  PT voiced understanding.

## 2021-06-26 ENCOUNTER — Other Ambulatory Visit: Payer: Self-pay

## 2021-06-26 ENCOUNTER — Ambulatory Visit: Payer: PPO | Admitting: Physician Assistant

## 2021-06-26 ENCOUNTER — Encounter: Payer: Self-pay | Admitting: Physician Assistant

## 2021-06-26 VITALS — BP 130/70 | HR 101 | Ht 68.5 in | Wt 239.0 lb

## 2021-06-26 DIAGNOSIS — Z01810 Encounter for preprocedural cardiovascular examination: Secondary | ICD-10-CM

## 2021-06-26 DIAGNOSIS — G4733 Obstructive sleep apnea (adult) (pediatric): Secondary | ICD-10-CM

## 2021-06-26 DIAGNOSIS — R0602 Shortness of breath: Secondary | ICD-10-CM | POA: Diagnosis not present

## 2021-06-26 DIAGNOSIS — E782 Mixed hyperlipidemia: Secondary | ICD-10-CM | POA: Diagnosis not present

## 2021-06-26 DIAGNOSIS — R000 Tachycardia, unspecified: Secondary | ICD-10-CM

## 2021-06-26 DIAGNOSIS — Z9989 Dependence on other enabling machines and devices: Secondary | ICD-10-CM | POA: Diagnosis not present

## 2021-06-26 NOTE — Patient Instructions (Signed)
Medication Instructions:  Please continue your current medications   *If you need a refill on your cardiac medications before your next appointment, please call your pharmacy*  Lab Work: Lipin panel (at your 4 month check up)  Testing/Procedures: None  Follow-Up: At [facility], you and your health needs are our priority.  As part of our continuing mission to provide you with exceptional heart care, we have created designated Provider Care Teams.  These Care Teams include your primary Cardiologist (physician) and Advanced Practice Providers (APPs -  Physician Assistants and Nurse Practitioners) who all work together to provide you with the care you need, when you need it.  Your next appointment:   4 month(s)  The format for your next appointment:   In Person  Provider:   [name] Sable, MD or Christell Faith, PA-C

## 2021-06-27 ENCOUNTER — Ambulatory Visit: Payer: PPO

## 2021-06-27 ENCOUNTER — Encounter: Payer: Self-pay | Admitting: Adult Health

## 2021-06-27 ENCOUNTER — Ambulatory Visit (INDEPENDENT_AMBULATORY_CARE_PROVIDER_SITE_OTHER): Payer: PPO | Admitting: Adult Health

## 2021-06-27 VITALS — BP 118/72 | HR 107 | Temp 98.1°F | Ht 69.0 in

## 2021-06-27 DIAGNOSIS — Z01811 Encounter for preprocedural respiratory examination: Secondary | ICD-10-CM | POA: Diagnosis not present

## 2021-06-27 DIAGNOSIS — G4733 Obstructive sleep apnea (adult) (pediatric): Secondary | ICD-10-CM

## 2021-06-27 DIAGNOSIS — J449 Chronic obstructive pulmonary disease, unspecified: Secondary | ICD-10-CM | POA: Diagnosis not present

## 2021-06-27 NOTE — Assessment & Plan Note (Signed)
Healthy weight loss 

## 2021-06-27 NOTE — Progress Notes (Addendum)
Anesthesia Chart Review   Case: [id number] Date/Time: 07/02/21 1135   Procedure: TOTAL HIP ARTHROPLASTY ANTERIOR APPROACH (Right: Hip)   Anesthesia type: Choice   Pre-op diagnosis: right hip osteoarthritis   Location: [place] [facility] / [facility]   Surgeons: Gaynelle Arabian, MD      Addendum:  Pt was evaluated 06/27/21 by Rexene Edison, NP with pulmonology and cleared for surgery at mild to moderate risk. "Major Pulmonary risks identified in the multifactorial risk analysis are but not limited to a) pneumonia; b) recurrent intubation risk; c) prolonged or recurrent acute respiratory failure needing mechanical ventilation; d) prolonged hospitalization; e) DVT/Pulmonary embolism; f) Acute Pulmonary edema   Recommend 1. Short duration of surgery as much as possible and avoid paralytic if possible 2. Recovery in step down or ICU with Pulmonary consultation if indicated.  3. DVT prophylaxis 4. Aggressive pulmonary toilet with o2, bronchodilatation, and incentive spirometry and early ambulation 5. CPAP At bedtime  And in post op setting if indicated."  Willeen Cass, PhD, FNP-BC [facility]/Anesthesiology Phone: [phone] 06/30/2021 8:39 AM     DISCUSSION:60 y.o. former smoker with h/o PONV, GERD, sleep apnea, DM II, COPD, right hip OA scheduled for above procedure 07/02/2021 with Dr. Gaynelle Arabian.   Pt seen by cardiology 06/26/2021. Per OV note, "Preoperative cardiac risk stratification: Scheduled to undergo a total hip arthroplasty on 07/02/2021.  Per Revised Cardiac Risk Index, she is low risk for noncardiac surgery.  Overall, her functional status is somewhat difficult to assess secondary to her comorbid conditions including severe hip pain, MS, obesity, and physical deconditioning.  However, it does appear per Duke Activity Status Index she is able to achieve at least 4 METs without cardiac limitation.  She may proceed with noncardiac surgery, from a cardiac perspective, without  further cardiac testing at an overall low risk.  Note has been faxed to the performing surgeon."  Pulmonology visit 06/27/2021, note pending.  VS: BP 120/65   Pulse 100   Temp 37.6 C (Oral)   Resp 16   Ht '5\' 9"'$  (1.753 m)   LMP 08/23/2014   SpO2 99%   BMI 35.44 kg/m   PROVIDERS: [name], NP is PCP   Cardiologist:  [name] Sable, MD  LABS: Labs reviewed: Acceptable for surgery. (all labs ordered are listed, but only abnormal results are displayed)  Labs Reviewed  GLUCOSE, CAPILLARY - Abnormal; Notable for the following components:      Result Value   Glucose-Capillary 109 (*)    All other components within normal limits  CBC - Abnormal; Notable for the following components:   Platelets 492 (*)    All other components within normal limits  COMPREHENSIVE METABOLIC PANEL - Abnormal; Notable for the following components:   Glucose, Bld 102 (*)    BUN 25 (*)    All other components within normal limits  SURGICAL PCR SCREEN  PROTIME-INR  TYPE AND SCREEN     IMAGES:   EKG: 04/18/2021 Rate 105 bpm  Sinus tachycardia Possible left atrial enlargement Right axis deviation Possible right ventricular hypertrophy  CV: Echo 06/05/2021  1. Left ventricular ejection fraction, by estimation, is 60 to 65%. The  left ventricle has normal function. The left ventricle has no regional  wall motion abnormalities. Left ventricular diastolic parameters are  consistent with Grade I diastolic  dysfunction (impaired relaxation).   2. Right ventricular systolic function is normal. The right ventricular  size is normal. Tricuspid regurgitation signal is inadequate for  assessing  PA pressure.   3. Left atrial size was mildly dilated.  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   History of kidney stones    Hypercholesterolemia    Multiple sclerosis (HCC)    weakness   Osteoporosis    osteoarthritis   Pneumonia    PONV (postoperative nausea and vomiting)    no problem after cataract surgery   Prediabetes 07/22/2018   Psoriasis    Shortness of breath dyspnea    wheezing   Sleep apnea 2012   sleep study / slight, no interventions   Urinary frequency    Weight gain 06/21/2014    Past Surgical History:  Procedure Laterality Date   CATARACT EXTRACTION W/PHACO Left 05/21/2015   Procedure: CATARACT EXTRACTION PHACO AND INTRAOCULAR LENS PLACEMENT ([place]);  Surgeon: Birder Robson, MD;  Location: [facility] ORS;  Service: Ophthalmology;  Laterality: Left;  [place] 00:35 AP% 22.9 CDE 8.11 fluid pack lot [id number]   CATARACT EXTRACTION W/PHACO Right 06/04/2015   Procedure: CATARACT EXTRACTION PHACO AND INTRAOCULAR LENS PLACEMENT ([facility]);  Surgeon: Birder Robson, MD;  Location: [facility] ORS;  Service: Ophthalmology;  Laterality: Right;  US:00:48 AP%: 10.5 CDE:5.08 Fluid lot [id number]   CYSTOSCOPY/URETEROSCOPY/HOLMIUM LASER/STENT PLACEMENT Bilateral 09/22/2016   Procedure: CYSTOSCOPY/URETEROSCOPY/HOLMIUM LASER/STENT PLACEMENT;  Surgeon: Hollice Espy, MD;  Location: [facility] ORS;  Service: Urology;  Laterality: Bilateral;   EYE SURGERY  2015   tissue biopsy   FOOT SURGERY  2015   JOINT REPLACEMENT Left 2013   hip replacement   LITHOTRIPSY     PTOSIS REPAIR Bilateral 02/18/2016   Procedure: BILATERAL PTOSIS REPAIR UPPER EYELIDS;  Surgeon: Karle Starch, MD;  Location: [place];  Service: Ophthalmology;  Laterality: Bilateral;  LEAVE PT EARLY AM   thumb surgery Right    TONSILLECTOMY   1973    MEDICATIONS:  ASPERCREME LIDOCAINE EX   ASPIRIN 81 PO   Azelastine HCl 137 MCG/SPRAY SOLN   BIOTIN PO   budesonide-formoterol (SYMBICORT) 80-4.5 MCG/ACT inhaler   calcipotriene-betamethasone (TACLONEX) ointment   cetirizine (ZYRTEC) 10 MG tablet   Cholecalciferol (VITAMIN D3) 10000 units capsule   ciclopirox (LOPROX) 0.77 % cream   clindamycin (CLEOCIN T) 1 % external solution   clobetasol (OLUX) 0.05 % topical foam   clobetasol (TEMOVATE) 0.05 % external solution   Continuous Blood Gluc Sensor (FREESTYLE LIBRE 14 DAY SENSOR) MISC   cyanocobalamin (,VITAMIN B-12,) 1000 MCG/ML injection   desipramine (NORPRAMIN) 25 MG tablet   diclofenac sodium (VOLTAREN) 1 % GEL   docusate sodium (COLACE) 100 MG capsule   Dulaglutide (TRULICITY) 1.5 0000000 SOPN   DULoxetine (CYMBALTA) 20 MG capsule   Evolocumab (REPATHA SURECLICK) [id number] MG/ML SOAJ   famotidine (PEPCID) 20 MG tablet   fenofibrate (TRICOR) 145 MG tablet   fluocinonide (LIDEX) 0.05 % external solution   hydrocortisone 2.5 % cream   hydroquinone 4 % cream   hydrOXYzine (  VISTARIL) 50 MG capsule   lamoTRIgine (LAMICTAL) 150 MG tablet   levalbuterol (XOPENEX HFA) 45 MCG/ACT inhaler   levalbuterol (XOPENEX) 1.25 MG/3ML nebulizer solution   Lysine 1000 MG TABS   Magnesium 500 MG TABS   meclizine (ANTIVERT) 25 MG tablet   methocarbamol (ROBAXIN) 500 MG tablet   metoprolol succinate (TOPROL-XL) 50 MG 24 hr tablet   neomycin-bacitracin-polymyxin (NEOSPORIN) [phone] ointment   Olopatadine HCl 0.2 % SOLN   OVER THE COUNTER MEDICATION   pantoprazole (PROTONIX) 40 MG tablet   phenazopyridine (PYRIDIUM) 95 MG tablet   polyethylene glycol powder (GLYCOLAX/MIRALAX) powder   SYRINGE/NEEDLE, DISP, 1 ML 23G X 1" 1 ML MISC   temazepam (RESTORIL) 30 MG capsule   traZODone (DESYREL) 50 MG tablet   valACYclovir (VALTREX) 1000 MG tablet   No current facility-administered medications for this encounter.    Konrad Felix, PA-C [facility]  Pre-Surgical Testing [phone]

## 2021-06-27 NOTE — Patient Instructions (Addendum)
Take Symbicort 2 puffs Twice daily  , rinse after use.  May use Xopenex inhaler or nebulizer as needed Continue on CPAP at bedtime  Keep up the good work  Work on [employer]  Do not drive if sleeping  Good luck with upcoming surgery .  Follow up with Dr. Mortimer Fries in 6 months and As needed   Please contact office for sooner follow up if symptoms do not improve or worsen or seek emergency care

## 2021-06-27 NOTE — Assessment & Plan Note (Addendum)
Pulmonary preop risk assessment.  Patient is a mild to moderate risk.  We went over the potential pulmonary surgical risk. Chest xray today   Major Pulmonary risks identified in the multifactorial risk analysis are but not limited to a) pneumonia; b) recurrent intubation risk; c) prolonged or recurrent acute respiratory failure needing mechanical ventilation; d) prolonged hospitalization; e) DVT/Pulmonary embolism; f) Acute Pulmonary edema  Recommend 1. Short duration of surgery as much as possible and avoid paralytic if possible 2. Recovery in step down or ICU with Pulmonary consultation if indicated.  3. DVT prophylaxis 4. Aggressive pulmonary toilet with o2, bronchodilatation, and incentive spirometry and early ambulation 5. CPAP At bedtime  And in post op setting if indicated.

## 2021-06-27 NOTE — Assessment & Plan Note (Signed)
COPD with asthma -Encouraged on inhaler compliance.  Continue on current regimen.  Plan  Patient Instructions  Take Symbicort 2 puffs Twice daily  , rinse after use.  May use Xopenex inhaler or nebulizer as needed Continue on CPAP at bedtime  Keep up the good work  Work on [employer]  Do not drive if sleeping  Good luck with upcoming surgery .  Follow up with Dr. Mortimer Fries in 6 months and As needed   Please contact office for sooner follow up if symptoms do not improve or worsen or seek emergency care

## 2021-06-27 NOTE — Progress Notes (Signed)
$'@Patient'w$  ID: [name], female    DOB: 1961/02/17, 60 y.o.   MRN: [id number]  Chief Complaint  Patient presents with   Follow-up    Referring provider: [name], NP  HPI: 60 yo female former smoker followed for OSA and COPD with asthma  Medical history significant for multiple sclerosis  TEST/EVENTS :  PFT April 29, 2020 that showed moderate restriction and airflow obstruction with FEV1 at 63%, ratio 72, FVC 68%, positive bronchodilator response.  DLCO 116%.    06/2020 CPAP ttiration showed CPAP 14 cm with med full fac emask (no need for oxygen or BIPAP )   06/27/2021 Follow up : OSA , COPD w/ Asthma, Preop pulmonary risk assessment  Patient presents for 6 month follow-up.  She has underlying moderate COPD with asthma.  She says overall breathing is doing okay.  She does get short of breath with minimum activities and has some episodes where she feels that she panics with her breathing.  She is unclear if this has to do with her underlying anxiety.  She uses albuterol and does feel better.  She has not been taking her Symbicort on a consistent basis.  Says that she sometimes forgets it.  But does feel that it helps her to breathe better.  We talked about inhaler compliance.  Patient remains independent.  Lives at home.  Is able to do her house chores.  She is having more difficulty doing her activities because of her right hip.  She has plans for upcoming hip surgery next week.Marland Kitchen  She wants to be more active.  Patient has underlying obstructive sleep apnea.  Says she is doing well on CPAP.  She does not have any issues.  She wears her CPAP each night.  Usually gets about 5 to 6 hours of sleep.  CPAP download shows excellent compliance.  Daily average usage at 5.5 hours.  AHI 1.0.  Patient is scheduled for right hip surgery next week.  Needs preop pulmonary risk assessment. Patient is not on oxygen.  Remains independent at home.  Is able to drop.  Do her own housework.  She  has had no recent flare of asthma or COPD.  No recent antibiotics or steroids.  Allergies  Allergen Reactions   Albuterol Shortness Of Breath and Other (See Comments)    Makes pt feel jittery/ tacycardic   Crestor [Rosuvastatin] Other (See Comments)    Joint pain, muscle pain, and hair loss   Halcion [Triazolam] Other (See Comments)    Dizziness,headaches,bladder problems   Levaquin [Levofloxacin In D5w] Diarrhea and Itching    Shoulder pain   Naproxen Sodium Swelling    Patient tolerates in small doses   Tylenol [Acetaminophen] Swelling    Patient tolerates in small doses   Cefaclor Other (See Comments)    Doesn't remember---unsure if actually allergic    Sulfa Antibiotics Itching    Unsure of reaction possibly itching   Tramadol Itching and Nausea And Vomiting   Zinc Other (See Comments)    constipation  constipation    Aripiprazole Other (See Comments)    Muscle tension/cramping   Diclofenac Sodium Rash    "made very sick"   Ibuprofen Swelling    Patient tolerates in small doses    Immunization History  Administered Date(s) Administered   Influenza Split 08/05/2011   Influenza, Seasonal, Injecte, Preservative Fre 06/26/2018   Influenza,inj,Quad PF,6+ Mos 08/17/2013, 07/13/2015, 07/06/2017, 06/26/2018, 06/26/2019, 07/04/2020   Influenza-Unspecified 07/08/2016, 06/18/2017, 06/26/2018   PFIZER(Purple  Top)SARS-COV-2 Vaccination 02/06/2020, 02/28/2020, 07/01/2020, 03/19/2021   Pneumococcal Conjugate-13 07/05/2014   Pneumococcal Polysaccharide-23 07/13/2015    Past Medical History:  Diagnosis Date   Arthritis    osteo   Asthma    Bipolar disorder ([place]) 05/21/2014   Cataracts, bilateral    COPD (chronic obstructive pulmonary disease) (HCC)    DDD (degenerative disc disease), cervical    also back   Depression    Diabetes mellitus without complication (HCC)    Dizziness    Positional   Edema    feet/legs   Fibromyalgia syndrome    Fungal infection    Finger  nails   GERD (gastroesophageal reflux disease)    Gout    Heart palpitations    Hip dysplasia, congenital 09/15/2013   History of kidney stones    Hypercholesterolemia    Multiple sclerosis (HCC)    weakness   Osteoporosis    osteoarthritis   Pneumonia    PONV (postoperative nausea and vomiting)    no problem after cataract surgery   Prediabetes 07/22/2018   Psoriasis    Shortness of breath dyspnea    wheezing   Sleep apnea 2012   sleep study / slight, no interventions   Urinary frequency    Weight gain 06/21/2014    Tobacco History: Social History   Tobacco Use  Smoking Status Former   Packs/day: 1.00   Years: 25.00   Pack years: 25.00   Types: Cigarettes   Quit date: 2019   Years since quitting: 3.6  Smokeless Tobacco Never  Tobacco Comments   occasional use   Counseling given: Not Answered Tobacco comments: occasional use   Outpatient Medications Prior to Visit  Medication Sig Dispense Refill   ASPERCREME LIDOCAINE EX Apply 1 application topically 4 (four) times daily as needed (for pain.).      ASPIRIN 81 PO Take by mouth daily.     Azelastine HCl 137 MCG/SPRAY SOLN PLACE 2 SPRAYS INTO BOTH NOSTRILS 2 (TWO) TIMES DAILY 30 mL 2   BIOTIN PO Take 200 mg by mouth daily.      budesonide-formoterol (SYMBICORT) 80-4.5 MCG/ACT inhaler Inhale 2 puffs into the lungs 2 (two) times daily. 1 each 6   calcipotriene-betamethasone (TACLONEX) ointment Apply topically daily. 60 g 0   cetirizine (ZYRTEC) 10 MG tablet Take 10 mg by mouth 2 (two) times daily.     Cholecalciferol (VITAMIN D3) 10000 units capsule Take 30,000 Units by mouth daily.      ciclopirox (LOPROX) 0.77 % cream Apply topically 2 (two) times daily. 30 g 0   clindamycin (CLEOCIN T) 1 % external solution Apply 1-2 times daily to scalp as needed for itch. 60 mL 1   clobetasol (OLUX) 0.05 % topical foam Apply to affected areas at arms, legs, trunk twice a day up to 2 weeks. Avoid applying to face, groin, and  axilla. Use as directed. Risk of skin atrophy with long-term use reviewed. 50 g 0   clobetasol (TEMOVATE) 0.05 % external solution Mix clobetasol solution with  CeraVe cream Use twice daily to affected areas.Avoid Face, groin and underarm 50 mL 0   Continuous Blood Gluc Sensor (FREESTYLE LIBRE 14 DAY SENSOR) MISC PLACE INTO SKIN FOR CONTINUOUS CHECK OF BLOOD SUGAR DAILY 6 each 2   cyanocobalamin (,VITAMIN B-12,) 1000 MCG/ML injection Inject 1 mL (1,000 mcg total) into the muscle every 30 (thirty) days. For B12 vitamin 3 mL 1   desipramine (NORPRAMIN) 25 MG tablet TAKE 1 TABLET BY MOUTH IN  THE MORNING 30 tablet 7   diclofenac sodium (VOLTAREN) 1 % GEL Apply 4 g topically 4 (four) times daily. 500 g 5   docusate sodium (COLACE) 100 MG capsule Take 1 capsule (100 mg total) by mouth 2 (two) times daily. 60 capsule 0   Dulaglutide (TRULICITY) 1.5 0000000 SOPN Inject 1.5 mg into the skin once a week. For diabetes. 6 mL 2   DULoxetine (CYMBALTA) 20 MG capsule Take 2 capsules (40 mg total) by mouth daily. 180 capsule 2   Evolocumab (REPATHA SURECLICK) XX123456 MG/ML SOAJ Inject 1 Dose into the skin every 14 (fourteen) days. 2 mL 3   famotidine (PEPCID) 20 MG tablet Take 1 tablet (20 mg total) by mouth 2 (two) times daily. 180 tablet 3   fenofibrate (TRICOR) 145 MG tablet Take 1 tablet (145 mg total) by mouth daily. 30 tablet 0   fluocinonide (LIDEX) 0.05 % external solution Apply 1 application topically 2 (two) times daily as needed. Apply to scalp twice daily as need for itch 180 mL 3   hydrocortisone 2.5 % cream Apply BID to affected area in underarms and groin prn flares     hydroquinone 4 % cream 1 APPLICATION TOPICALLY DAILY AS NEEDED FOR BLEMISHES.  3   hydrOXYzine (VISTARIL) 50 MG capsule TAKE 1 CAPSLE BY MOUTH AT NOON, 1 AT 6 PM, AND 1 AS NEEDED 270 capsule 2   lamoTRIgine (LAMICTAL) 150 MG tablet Take 2 tablets (300 mg total) by mouth at bedtime. 180 tablet 1   levalbuterol (XOPENEX HFA) 45 MCG/ACT  inhaler INHALE 1 TO 2 PUFFS BY MOUTH EVERY 6 HOURS AS NEEDED FOR WHEEZE 1 each 0   levalbuterol (XOPENEX) 1.25 MG/3ML nebulizer solution USE 1 VIAL VIA NEBULIZER EVERY 4 HOURS AS NEEDED FOR WHEEZING OR SHORTNESS OF BREATH 270 mL 1   Lysine 1000 MG TABS Take 2,000-4,000 mg by mouth at bedtime. 2000 mg scheduled at bedtime and patient will take 4000 mg if she has outbreak     Magnesium 500 MG TABS Take 1 tablet by mouth at bedtime.     meclizine (ANTIVERT) 25 MG tablet Take 0.5-1 tablets (12.5-25 mg total) by mouth 3 (three) times daily as needed for dizziness (sedation caution). 30 tablet 0   methocarbamol (ROBAXIN) 500 MG tablet TAKE 1-2 TABLETS (500-1,000 MG TOTAL) BY MOUTH DAILY AS NEEDED FOR MUSCLE SPASMS. 180 tablet 0   metoprolol succinate (TOPROL-XL) 50 MG 24 hr tablet Take 1 tablet (50 mg total) by mouth daily. Take with or immediately following a meal. 30 tablet 0   neomycin-bacitracin-polymyxin (NEOSPORIN) [id number] ointment Apply 1 application topically 4 (four) times daily as needed (for cut/scrapes.).     Olopatadine HCl 0.2 % SOLN 1 drop once daily     OVER THE COUNTER MEDICATION Delta 8 and Delta 9 gummies     pantoprazole (PROTONIX) 40 MG tablet Take 1 tablet (40 mg total) by mouth daily. For heartburn 90 tablet 3   phenazopyridine (PYRIDIUM) 95 MG tablet Take 95 mg by mouth 3 (three) times daily as needed for pain.     polyethylene glycol powder (GLYCOLAX/MIRALAX) powder MIX 17 GRAMS (1 CAPFUL) WITH 4-8 OZ OF LIQUID AND TAKE BY MOUTH TWICE DAILY AS NEEDED 527 g 0   SYRINGE/NEEDLE, DISP, 1 ML 23G X 1" 1 ML MISC Use as directed 6 each 0   temazepam (RESTORIL) 30 MG capsule TAKE ONE TO TWO CAPSULES BY MOUTH NIGHTLY AT BEDTIME 60 capsule 1   traZODone (DESYREL) 50 MG  tablet Take 2 tablets (100 mg total) by mouth at bedtime. 60 tablet 7   valACYclovir (VALTREX) 1000 MG tablet Take 1 tablet (1,000 mg total) by mouth daily. For herpes outbreak prevention 90 tablet 0   No  facility-administered medications prior to visit.     Review of Systems:   Constitutional:   No  weight loss, night sweats,  Fevers, chills, f +atigue, or  lassitude.  HEENT:   No headaches,  Difficulty swallowing,  Tooth/dental problems, or  Sore throat,                No sneezing, itching, ear ache, nasal congestion, post nasal drip,   CV:  No chest pain,  Orthopnea, PND, swelling in lower extremities, anasarca, dizziness, palpitations, syncope.   GI  No heartburn, indigestion, abdominal pain, nausea, vomiting, diarrhea, change in bowel habits, loss of appetite, bloody stools.   Resp:  No excess mucus, no productive cough,  No non-productive cough,  No coughing up of blood.  No change in color of mucus.  No wheezing.  No chest wall deformity  Skin: no rash or lesions.  GU: no dysuria, change in color of urine, no urgency or frequency.  No flank pain, no hematuria   MS:  No joint pain or swelling.  No decreased range of motion.  No back pain.    Physical Exam  BP 118/72 (BP Location: Left Arm, Patient Position: Sitting, Cuff Size: Normal)   Pulse (!) 107   Temp 98.1 F (36.7 C) (Oral)   Ht '5\' 9"'$  (1.753 m)   LMP 08/23/2014   SpO2 97%   BMI 35.29 kg/m   GEN: A/Ox3; pleasant , NAD, well nourished , walks with cane    HEENT:  [place]/AT,  NOSE-clear, THROAT-clear, no lesions, no postnasal drip or exudate noted.   NECK:  Supple w/ fair ROM; no JVD; normal carotid impulses w/o bruits; no thyromegaly or nodules palpated; no lymphadenopathy.    RESP  Clear  P & A; w/o, wheezes/ rales/ or rhonchi. no accessory muscle use, no dullness to percussion  CARD:  RRR, no m/r/g, no peripheral edema, pulses intact, no cyanosis or clubbing.  GI:   Soft & nt; nml bowel sounds; no organomegaly or masses detected.   Musco: Warm bil, no deformities or joint swelling noted.   Neuro: alert, no focal deficits noted.    Skin: Warm, no lesions or rashes    Lab  Results:      Imaging: ECHOCARDIOGRAM COMPLETE  Result Date: 06/06/2021    ECHOCARDIOGRAM REPORT   Patient Name:   SEVERA SCHMAL Date of Exam: 06/05/2021 Medical Rec #:  [id number]         Height:       69.0 in Accession #:    [id number]        Weight:       240.0 lb Date of Birth:  Apr 26, 1961         BSA:          2.232 m Patient Age:    29 years          BP:           100/70 mmHg Patient Gender: F                 HR:           94 bpm. Exam Location:  [facility] Procedure: 2D Echo, Cardiac Doppler and Color Doppler Indications:    R06.02 SOB; R07.9*  Chest pain, unspecified  History:        Patient has no prior history of Echocardiogram examinations.                 Signs/Symptoms:Shortness of Breath and Chest Pain; Risk                 Factors:Sleep Apnea, Dyslipidemia, Diabetes and Former Smoker.                 Pre-op assessment.  Sonographer:    Pilar Jarvis RDMS, RVT, RDCS Referring Phys: [id number] Robin Searing VISSER IMPRESSIONS  1. Left ventricular ejection fraction, by estimation, is 60 to 65%. The left ventricle has normal function. The left ventricle has no regional wall motion abnormalities. Left ventricular diastolic parameters are consistent with Grade I diastolic dysfunction (impaired relaxation).  2. Right ventricular systolic function is normal. The right ventricular size is normal. Tricuspid regurgitation signal is inadequate for assessing PA pressure.  3. Left atrial size was mildly dilated. FINDINGS  Left Ventricle: Left ventricular ejection fraction, by estimation, is 60 to 65%. The left ventricle has normal function. The left ventricle has no regional wall motion abnormalities. The left ventricular internal cavity size was normal in size. There is  no left ventricular hypertrophy. Left ventricular diastolic parameters are consistent with Grade I diastolic dysfunction (impaired relaxation). Right Ventricle: The right ventricular size is normal. No increase in right ventricular wall  thickness. Right ventricular systolic function is normal. Tricuspid regurgitation signal is inadequate for assessing PA pressure. Left Atrium: Left atrial size was mildly dilated. Right Atrium: Right atrial size was normal in size. Pericardium: There is no evidence of pericardial effusion. Mitral Valve: The mitral valve is normal in structure. No evidence of mitral valve regurgitation. No evidence of mitral valve stenosis. Tricuspid Valve: The tricuspid valve is normal in structure. Tricuspid valve regurgitation is not demonstrated. No evidence of tricuspid stenosis. Aortic Valve: The aortic valve was not well visualized. Aortic valve regurgitation is not visualized. No aortic stenosis is present. Aortic valve mean gradient measures 4.0 mmHg. Aortic valve peak gradient measures 6.7 mmHg. Aortic valve area, by VTI measures 2.93 cm. Pulmonic Valve: The pulmonic valve was normal in structure. Pulmonic valve regurgitation is not visualized. No evidence of pulmonic stenosis. Aorta: The aortic root is normal in size and structure. Venous: The inferior vena cava is normal in size with greater than 50% respiratory variability, suggesting right atrial pressure of 3 mmHg. IAS/Shunts: No atrial level shunt detected by color flow Doppler.  LEFT VENTRICLE PLAX 2D LVIDd:         3.70 cm     Diastology LVIDs:         2.50 cm     LV e' medial:    4.57 cm/s LV PW:         1.40 cm     LV E/e' medial:  15.8 LV IVS:        1.40 cm     LV e' lateral:   5.66 cm/s LVOT diam:     2.10 cm     LV E/e' lateral: 12.8 LV SV:         65 LV SV Index:   29 LVOT Area:     3.46 cm  LV Volumes (MOD) LV vol d, MOD A2C: 59.0 ml LV vol d, MOD A4C: 66.0 ml LV vol s, MOD A2C: 28.9 ml LV vol s, MOD A4C: 28.4 ml LV SV MOD A2C:  30.2 ml LV SV MOD A4C:     66.0 ml LV SV MOD BP:      34.1 ml RIGHT VENTRICLE             IVC RV Basal diam:  3.80 cm     IVC diam: 1.90 cm RV S prime:     12.90 cm/s TAPSE (M-mode): 2.9 cm      PULMONARY VEINS                              Systolic Velocity: 123XX123 cm/s LEFT ATRIUM             Index       RIGHT ATRIUM           Index LA diam:        4.40 cm 1.97 cm/m  RA Area:     20.00 cm LA Vol (A2C):   50.9 ml 22.80 ml/m RA Volume:   61.60 ml  27.59 ml/m LA Vol (A4C):   51.1 ml 22.89 ml/m LA Biplane Vol: 51.1 ml 22.89 ml/m  AORTIC VALVE                   PULMONIC VALVE AV Area (Vmax):    2.87 cm    PV Vmax:       1.31 m/s AV Area (Vmean):   2.66 cm    PV Peak grad:  6.9 mmHg AV Area (VTI):     2.93 cm AV Vmax:           129.00 cm/s AV Vmean:          89.300 cm/s AV VTI:            0.222 m AV Peak Grad:      6.7 mmHg AV Mean Grad:      4.0 mmHg LVOT Vmax:         107.00 cm/s LVOT Vmean:        68.700 cm/s LVOT VTI:          0.188 m LVOT/AV VTI ratio: 0.85  AORTA Ao Root diam: 3.40 cm Ao Asc diam:  3.10 cm Ao Arch diam: 3.1 cm MITRAL VALVE MV Area (PHT): 3.56 cm     SHUNTS MV Decel Time: 213 msec     Systemic VTI:  0.19 m MV E velocity: 72.20 cm/s   Systemic Diam: 2.10 cm MV A velocity: 101.00 cm/s MV E/A ratio:  0.71 Ida Rogue MD Electronically signed by Ida Rogue MD Signature Date/Time: 06/06/2021/5:57:32 PM    Final     cyanocobalamin ((VITAMIN B-12)) injection 1,000 mcg     Date Action Dose Route User   06/04/2021 1502 Given 1,000 mcg Intramuscular (Left Upper Outer Quadrant) Francella Solian, CMA       No flowsheet data found.  No results found for: NITRICOXIDE      Assessment & Plan:   OSA (obstructive sleep apnea) Excellent control and compliance on CPAP   Plan  Patient Instructions  Take Symbicort 2 puffs Twice daily  , rinse after use.  May use Xopenex inhaler or nebulizer as needed Continue on CPAP at bedtime  Keep up the good work  Work on [employer]  Do not drive if sleeping  Good luck with upcoming surgery .  Follow up with Dr. Mortimer Fries in 6 months and As needed   Please contact office for sooner follow up if symptoms do not  improve or worsen or seek emergency care        Obesity Healthy weight loss  COPD with asthma ([place]) COPD with asthma -Encouraged on inhaler compliance.  Continue on current regimen.  Plan  Patient Instructions  Take Symbicort 2 puffs Twice daily  , rinse after use.  May use Xopenex inhaler or nebulizer as needed Continue on CPAP at bedtime  Keep up the good work  Work on [employer]  Do not drive if sleeping  Good luck with upcoming surgery .  Follow up with Dr. Mortimer Fries in 6 months and As needed   Please contact office for sooner follow up if symptoms do not improve or worsen or seek emergency care       Preop pulmonary/respiratory exam Pulmonary preop risk assessment.  Patient is a mild to moderate risk.  We went over the potential pulmonary surgical risk.   Major Pulmonary risks identified in the multifactorial risk analysis are but not limited to a) pneumonia; b) recurrent intubation risk; c) prolonged or recurrent acute respiratory failure needing mechanical ventilation; d) prolonged hospitalization; e) DVT/Pulmonary embolism; f) Acute Pulmonary edema  Recommend 1. Short duration of surgery as much as possible and avoid paralytic if possible 2. Recovery in step down or ICU with Pulmonary consultation if indicated.  3. DVT prophylaxis 4. Aggressive pulmonary toilet with o2, bronchodilatation, and incentive spirometry and early ambulation 5. CPAP At bedtime  And in post op setting if indicated.        Rexene Edison, NP 06/27/2021

## 2021-06-27 NOTE — Assessment & Plan Note (Signed)
Excellent control and compliance on CPAP   Plan  Patient Instructions  Take Symbicort 2 puffs Twice daily  , rinse after use.  May use Xopenex inhaler or nebulizer as needed Continue on CPAP at bedtime  Keep up the good work  Work on [employer]  Do not drive if sleeping  Good luck with upcoming surgery .  Follow up with Dr. Mortimer Fries in 6 months and As needed   Please contact office for sooner follow up if symptoms do not improve or worsen or seek emergency care

## 2021-06-30 ENCOUNTER — Ambulatory Visit
Admission: RE | Admit: 2021-06-30 | Discharge: 2021-06-30 | Disposition: A | Discharge status: Home / Self Care | Payer: PPO | Attending: Adult Health | Admitting: Adult Health

## 2021-06-30 ENCOUNTER — Other Ambulatory Visit
Admission: RE | Admit: 2021-06-30 | Discharge: 2021-06-30 | Disposition: A | Discharge status: Home / Self Care | Payer: PPO | Source: Ambulatory Visit | Attending: Orthopedic Surgery | Admitting: Orthopedic Surgery

## 2021-06-30 ENCOUNTER — Ambulatory Visit
Admission: RE | Admit: 2021-06-30 | Discharge: 2021-06-30 | Disposition: A | Discharge status: Home / Self Care | Payer: PPO | Source: Ambulatory Visit | Attending: Adult Health | Admitting: Adult Health

## 2021-06-30 ENCOUNTER — Other Ambulatory Visit: Payer: Self-pay | Admitting: Primary Care

## 2021-06-30 ENCOUNTER — Other Ambulatory Visit: Payer: Self-pay

## 2021-06-30 DIAGNOSIS — Z01811 Encounter for preprocedural respiratory examination: Secondary | ICD-10-CM | POA: Insufficient documentation

## 2021-06-30 DIAGNOSIS — Z20822 Contact with and (suspected) exposure to covid-19: Secondary | ICD-10-CM | POA: Insufficient documentation

## 2021-06-30 DIAGNOSIS — Z01812 Encounter for preprocedural laboratory examination: Secondary | ICD-10-CM | POA: Insufficient documentation

## 2021-06-30 DIAGNOSIS — Z01818 Encounter for other preprocedural examination: Secondary | ICD-10-CM | POA: Diagnosis not present

## 2021-06-30 DIAGNOSIS — G8929 Other chronic pain: Secondary | ICD-10-CM

## 2021-06-30 DIAGNOSIS — M159 Polyosteoarthritis, unspecified: Secondary | ICD-10-CM

## 2021-06-30 DIAGNOSIS — M2550 Pain in unspecified joint: Secondary | ICD-10-CM

## 2021-06-30 DIAGNOSIS — J45909 Unspecified asthma, uncomplicated: Secondary | ICD-10-CM | POA: Diagnosis not present

## 2021-06-30 DIAGNOSIS — J449 Chronic obstructive pulmonary disease, unspecified: Secondary | ICD-10-CM | POA: Diagnosis not present

## 2021-06-30 DIAGNOSIS — M545 Low back pain, unspecified: Secondary | ICD-10-CM

## 2021-06-30 NOTE — Progress Notes (Signed)
PT called in and LVMM on 06/27/2021 wanting to get Covid test done at [facility].  Called PST dept of [facility] and they gave permission for pt to come between the hourse of 8-12 and put pt on the schedule.  Called pt and she is aware to go to [place] for covid testin on 06/30/2021 between the hourse of 8-12 .  PT is aware to go to [place] and they are first office on the right

## 2021-06-30 NOTE — H&P (Signed)
TOTAL HIP ADMISSION H&P  Patient is admitted for right total hip arthroplasty.  Subjective:  Chief Complaint: Right hip pain  HPI: Emily Moon, 60 y.o. female, has a history of pain and functional disability in the right hip due to arthritis and patient has failed non-surgical conservative treatments for greater than 12 weeks to include NSAID's and/or analgesics and activity modification. Onset of symptoms was gradual, starting  several  years ago with gradually worsening course since that time. The patient noted no past surgery on the right hip. Patient currently rates pain in the right hip at 6 out of 10 with activity. Patient has worsening of pain with activity and weight bearing, pain that interfers with activities of daily living, and pain with passive range of motion. Patient has evidence of periarticular osteophytes and joint space narrowing by imaging studies. This condition presents safety issues increasing the risk of falls. There is no current active infection.  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/10/2019   Epigastric pain 12/21/2018   OSA (obstructive sleep apnea) 12/02/2018   Joint swelling 10/24/2018   Environmental and seasonal allergies 10/24/2018   Greater trochanteric bursitis of right hip 09/28/2018   Bipolar I disorder, most recent episode (or current) manic ([place]) 05/20/2018   Vitamin D deficiency 02/15/2018   Preventative health care 11/24/2017   B12 deficiency 05/22/2016   Vertigo 03/16/2016   Medicare annual wellness visit, subsequent 10/25/2015   Chronic back pain 10/31/2014   Bipolar disorder ([place]) 05/21/2014    Pre-operative clearance 01/22/2014   Deformity of right foot 12/27/2013   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 09/15/2013   Insomnia 09/01/2011   Obesity 05/04/2011   HLD (hyperlipidemia) 01/20/2011   Chest pain 01/20/2011   Chronic pain syndrome 01/07/2011   RENAL CALCULUS, RECURRENT 11/13/2010   MULTIPLE SCLEROSIS, PROGRESSIVE/RELAPSING 08/28/2010   GERD 08/28/2010   Osteoarthritis 08/28/2010   NEPHROLITHIASIS, HX OF 08/28/2010    Past Medical History:  Diagnosis Date   Arthritis    osteo   Asthma    Bipolar disorder ([place]) 05/21/2014   Cataracts, bilateral    COPD (chronic obstructive pulmonary disease) (HCC)    DDD (degenerative disc disease), cervical    also back   Depression    Diabetes mellitus without complication (HCC)    Dizziness    Positional   Edema    feet/legs   Fibromyalgia syndrome    Fungal infection    Finger nails   GERD (gastroesophageal reflux disease)    Gout    Heart palpitations    Hip dysplasia, congenital 09/15/2013   History of kidney stones    Hypercholesterolemia    Multiple sclerosis (HCC)    weakness   Osteoporosis    osteoarthritis   Pneumonia    PONV (postoperative nausea and vomiting)    no problem after cataract surgery   Prediabetes 07/22/2018   Psoriasis    Shortness of breath dyspnea    wheezing   Sleep apnea 2012   sleep study / slight, no interventions   Urinary frequency    Weight gain 06/21/2014    Past Surgical History:  Procedure Laterality Date   CATARACT EXTRACTION W/PHACO  Left 05/21/2015   Procedure: CATARACT EXTRACTION PHACO AND INTRAOCULAR LENS PLACEMENT (IOC);  Surgeon: Birder Robson, MD;  Location: [facility];  Service: Ophthalmology;  Laterality: Left;  [place] 00:35 AP% 22.9 CDE 8.11 fluid pack lot [id number]   CATARACT EXTRACTION W/PHACO Right 06/04/2015   Procedure: CATARACT EXTRACTION PHACO AND INTRAOCULAR LENS PLACEMENT (IOC);  Surgeon: Birder Robson, MD;  Location: [facility];  Service:  Ophthalmology;  Laterality: Right;  US:00:48 AP%: 10.5 CDE:5.08 Fluid lot [id number]   CYSTOSCOPY/URETEROSCOPY/HOLMIUM LASER/STENT PLACEMENT Bilateral 09/22/2016   Procedure: CYSTOSCOPY/URETEROSCOPY/HOLMIUM LASER/STENT PLACEMENT;  Surgeon: Hollice Espy, MD;  Location: [facility];  Service: Urology;  Laterality: Bilateral;   EYE SURGERY  2015   tissue biopsy   FOOT SURGERY  2015   JOINT REPLACEMENT Left 2013   hip replacement   LITHOTRIPSY     PTOSIS REPAIR Bilateral 02/18/2016   Procedure: BILATERAL PTOSIS REPAIR UPPER EYELIDS;  Surgeon: Karle Starch, MD;  Location: [facility];  Service: Ophthalmology;  Laterality: Bilateral;  LEAVE PT EARLY AM   thumb surgery Right    TONSILLECTOMY  1973    Prior to Admission medications   Medication Sig Start Date End Date Taking? Authorizing Provider  ASPERCREME LIDOCAINE EX Apply 1 application topically 4 (four) times daily as needed (for pain.).     [provider]  ASPIRIN 81 PO Take by mouth daily.    [provider]  Azelastine HCl 137 MCG/SPRAY SOLN PLACE 2 SPRAYS INTO BOTH NOSTRILS 2 (TWO) TIMES DAILY 04/25/21   [name], NP  BIOTIN PO Take 200 mg by mouth daily.     [provider]  budesonide-formoterol (SYMBICORT) 80-4.5 MCG/ACT inhaler Inhale 2 puffs into the lungs 2 (two) times daily. 07/03/20   Parrett, Fonnie Mu, NP  calcipotriene-betamethasone (TACLONEX) ointment Apply topically daily. 06/10/20   [name], NP  cetirizine (ZYRTEC) 10 MG tablet Take 10 mg by mouth 2 (two) times daily.    [provider]  Cholecalciferol (VITAMIN D3) 10000 units capsule Take 30,000 Units by mouth daily.     [provider]  ciclopirox (LOPROX) 0.77 % cream Apply topically 2 (two) times daily. 10/27/19   [name], NP  clindamycin (CLEOCIN T) 1 % external solution Apply 1-2 times daily to scalp as needed for itch. 03/27/21   Moye, [place], MD  clobetasol (OLUX) 0.05 % topical foam  Apply to affected areas at arms, legs, trunk twice a day up to 2 weeks. Avoid applying to face, groin, and axilla. Use as directed. Risk of skin atrophy with long-term use reviewed. 03/27/21   Moye, [place], MD  clobetasol (TEMOVATE) 0.05 % external solution Mix clobetasol solution with  CeraVe cream Use twice daily to affected areas.Avoid Face, groin and underarm 02/22/20   Moye, [place], MD  Continuous Blood Gluc Sensor (FREESTYLE LIBRE 14 DAY SENSOR) MISC PLACE INTO SKIN FOR CONTINUOUS CHECK OF BLOOD SUGAR DAILY 06/19/21   [name], NP  cyanocobalamin (,VITAMIN B-12,) 1000 MCG/ML injection Inject 1 mL (1,000 mcg total) into the muscle every 30 (thirty) days. For B12 vitamin 06/04/21   [name], NP  desipramine (NORPRAMIN) 25 MG tablet TAKE 1 TABLET BY MOUTH IN THE MORNING 02/26/21   Plovsky, Berneta Sages, MD  diclofenac sodium (VOLTAREN) 1 % GEL Apply 4 g topically 4 (four) times daily. 11/15/14   Jearld Fenton, NP  docusate sodium (COLACE) 100 MG capsule Take 1 capsule (100 mg total) by mouth 2 (two) times daily. 09/22/16  Hollice Espy, MD  Dulaglutide (TRULICITY) 1.5 0000000 SOPN Inject 1.5 mg into the skin once a week. For diabetes. 04/15/21   [name], NP  DULoxetine (CYMBALTA) 20 MG capsule Take 2 capsules (40 mg total) by mouth daily. 02/26/21   Plovsky, Berneta Sages, MD  Evolocumab (REPATHA SURECLICK) [id number] MG/ML SOAJ Inject 1 Dose into the skin every 14 (fourteen) days. 05/13/21   [name] Sable, MD  famotidine (PEPCID) 20 MG tablet Take 1 tablet (20 mg total) by mouth 2 (two) times daily. 06/04/21   [name], NP  fenofibrate (TRICOR) 145 MG tablet Take 1 tablet (145 mg total) by mouth daily. 06/17/21   [name] Sable, MD  fluocinonide (LIDEX) 0.05 % external solution Apply 1 application topically 2 (two) times daily as needed. Apply to scalp twice daily as need for itch 02/22/20   Moye, [place], MD  hydrocortisone 2.5 % cream Apply BID to affected area in  underarms and groin prn flares 01/11/20   [provider]  hydroquinone 4 % cream 1 APPLICATION TOPICALLY DAILY AS NEEDED FOR BLEMISHES. 05/16/15   [provider]  hydrOXYzine (VISTARIL) 50 MG capsule TAKE 1 CAPSLE BY MOUTH AT NOON, 1 AT 6 PM, AND 1 AS NEEDED 02/26/21   Norma Fredrickson, MD  lamoTRIgine (LAMICTAL) 150 MG tablet Take 2 tablets (300 mg total) by mouth at bedtime. 02/26/21   Plovsky, Berneta Sages, MD  levalbuterol (XOPENEX HFA) 45 MCG/ACT inhaler INHALE 1 TO 2 PUFFS BY MOUTH EVERY 6 HOURS AS NEEDED FOR WHEEZE 11/20/20   [name], NP  levalbuterol (XOPENEX) 1.25 MG/3ML nebulizer solution USE 1 VIAL VIA NEBULIZER EVERY 4 HOURS AS NEEDED FOR WHEEZING OR SHORTNESS OF BREATH 02/21/19   [name], NP  Lysine 1000 MG TABS Take 2,000-4,000 mg by mouth at bedtime. 2000 mg scheduled at bedtime and patient will take 4000 mg if she has Dentist, Historical, MD  Magnesium 500 MG TABS Take 1 tablet by mouth at bedtime.    [provider]  meclizine (ANTIVERT) 25 MG tablet Take 0.5-1 tablets (12.5-25 mg total) by mouth 3 (three) times daily as needed for dizziness (sedation caution). 03/10/21   Tonia Ghent, MD  methocarbamol (ROBAXIN) 500 MG tablet TAKE 1-2 TABLETS (500-1,000 MG TOTAL) BY MOUTH DAILY AS NEEDED FOR MUSCLE SPASMS. 03/24/21   [name], NP  metoprolol succinate (TOPROL-XL) 50 MG 24 hr tablet Take 1 tablet (50 mg total) by mouth daily. Take with or immediately following a meal. 04/24/21 07/23/21  [name] Sable, MD  neomycin-bacitracin-polymyxin (NEOSPORIN) [phone] ointment Apply 1 application topically 4 (four) times daily as needed (for cut/scrapes.).    [provider]  Olopatadine HCl 0.2 % SOLN 1 drop once daily    [provider]  OVER THE COUNTER MEDICATION Delta 8 and Delta 9 gummies    [provider]  pantoprazole (PROTONIX) 40 MG tablet Take 1 tablet (40 mg total) by mouth daily. For heartburn  06/04/21   [name], NP  phenazopyridine (PYRIDIUM) 95 MG tablet Take 95 mg by mouth 3 (three) times daily as needed for pain.    [provider]  polyethylene glycol powder (GLYCOLAX/MIRALAX) powder MIX 17 GRAMS (1 CAPFUL) WITH 4-8 OZ OF LIQUID AND TAKE BY MOUTH TWICE DAILY AS NEEDED 11/24/17   [name], NP  SYRINGE/NEEDLE, DISP, 1 ML 23G X 1" 1 ML MISC Use as directed 06/04/21   [name], NP  temazepam (RESTORIL) 30  MG capsule TAKE ONE TO TWO CAPSULES BY MOUTH NIGHTLY AT BEDTIME 06/04/21   Plovsky, Berneta Sages, MD  traZODone (DESYREL) 50 MG tablet Take 2 tablets (100 mg total) by mouth at bedtime. 02/26/21   Plovsky, Berneta Sages, MD  valACYclovir (VALTREX) 1000 MG tablet Take 1 tablet (1,000 mg total) by mouth daily. For herpes outbreak prevention 04/23/21   [name], NP    Allergies  Allergen Reactions   Albuterol Shortness Of Breath and Other (See Comments)    Makes pt feel jittery/ tacycardic   Crestor [Rosuvastatin] Other (See Comments)    Joint pain, muscle pain, and hair loss   Halcion [Triazolam] Other (See Comments)    Dizziness,headaches,bladder problems   Levaquin [Levofloxacin In D5w] Diarrhea and Itching    Shoulder pain   Naproxen Sodium Swelling    Patient tolerates in small doses   Tylenol [Acetaminophen] Swelling    Patient tolerates in small doses   Cefaclor Other (See Comments)    Doesn't remember---unsure if actually allergic    Sulfa Antibiotics Itching    Unsure of reaction possibly itching   Tramadol Itching and Nausea And Vomiting   Zinc Other (See Comments)    constipation  constipation    Aripiprazole Other (See Comments)    Muscle tension/cramping   Diclofenac Sodium Rash    "made very sick"   Ibuprofen Swelling    Patient tolerates in small doses    Social History   Socioeconomic History   Marital status: Divorced    Spouse name: Not on file   Number of children: 1   Years of education: Not on file   Highest  education level: Not on file  Occupational History   Occupation: Therapist, 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  Tobacco Use   Smoking status: Former    Packs/day: 1.00    Years: 25.00    Pack years: 25.00    Types: Cigarettes    Quit date: 2019    Years since quitting: 3.6   Smokeless tobacco: Never   Tobacco comments:    occasional use  Vaping Use   Vaping Use: Former  Substance and Sexual Activity   Alcohol use: No    Alcohol/week: 0.0 standard drinks   Drug use: Yes    Types: Methylphenidate, Marijuana    Comment: Delta 8 - once per day   Sexual activity: Never  Other Topics Concern   Not on file  Social History Narrative   Not on file   Social Determinants of Health   Financial Resource Strain: Low Risk    Difficulty of Paying Living Expenses: Not hard at all  Food Insecurity: No Food Insecurity   Worried About Charity fundraiser in the Last Year: Never true   Ran Out of Food in the Last Year: Never true  Transportation Needs: No Transportation Needs   Lack of Transportation (Medical): No   Lack of Transportation (Non-Medical): No  Physical Activity: Inactive   Days of Exercise per Week: 0 days   Minutes of Exercise per Session: 0 min  Stress: No Stress Concern Present   Feeling of Stress : Not at all  Social Connections: Not on file  Intimate Partner Violence: Not At Risk   Fear of Current or Ex-Partner: No   Emotionally Abused: No   Physically Abused: No   Sexually Abused: No    Tobacco Use: Medium Risk   Smoking Tobacco Use: Former   Smokeless Tobacco Use: Never  Social History   Substance and Sexual Activity  Alcohol Use No   Alcohol/week: 0.0 standard drinks    Family History  Problem Relation Age of Onset   Cancer Father        Abdomen with mastasis   Cancer Mother    Heart disease Mother    Kidney disease Neg Hx    Bladder Cancer Neg Hx    Prostate cancer Neg Hx    Kidney cancer Neg Hx     ROS: Constitutional: no fever,  no chills, no night sweats, no significant weight loss Cardiovascular: no chest pain, no palpitations Respiratory: no cough, no shortness of breath, No COPD Gastrointestinal: no vomiting, no nausea Musculoskeletal: no swelling in Joints, Joint Pain Neurologic: no numbness, no tingling, no difficulty with balance    Objective:  Physical Exam: Well nourished and well developed.  General: Alert and oriented x3, cooperative and pleasant, no acute distress.  Head: normocephalic, atraumatic, neck supple.  Eyes: EOMI.  Respiratory: breath sounds clear in all fields, no wheezing, rales, or rhonchi. Cardiovascular: Regular rate and rhythm, no murmurs, gallops or rubs.  Abdomen: non-tender to palpation and soft, normoactive bowel sounds. Musculoskeletal:  The patient has a slow, shuffling gait and ambulates with a cane.     Right Hip Exam:   The range of motion: Flexion to 100 degrees, Internal Rotation is minimal, External Rotation to 20 degrees, and abduction to 20 degrees without discomfort.   There is no tenderness over the greater trochanteric bursa.   Calves soft and nontender. Motor function intact in LE. Strength 5/5 LE bilaterally. Neuro: Distal pulses 2+. Sensation to light touch intact in LE.   Vital signs in last 24 hours:    Imaging Review  Radiographs- AP pelvis, AP and lateral of the right hip dated 04/04/2021 demonstrate bone-on-bone arthritis in the right hip. She has a left hip arthroplasty in good position with no periprosthetic abnormalities.  Assessment/Plan:  End stage arthritis, right hip  The patient history, physical examination, clinical judgement of the provider and imaging studies are consistent with end stage degenerative joint disease of the right hip and total hip arthroplasty is deemed medically necessary. The treatment options including medical management, injection therapy, arthroscopy and arthroplasty were discussed at length. The risks and benefits  of total hip arthroplasty were presented and reviewed. The risks due to aseptic loosening, infection, stiffness, dislocation/subluxation, thromboembolic complications and other imponderables were discussed. The patient acknowledged the explanation, agreed to proceed with the plan and consent was signed. Patient is being admitted for inpatient treatment for surgery, pain control, PT, OT, prophylactic antibiotics, VTE prophylaxis, progressive ambulation and ADLs and discharge planning.The patient is planning to be discharged  home .   Patient's anticipated LOS is less than 2 midnights, meeting these requirements: - Younger than 41 - Lives within 1 hour of care - Has a competent adult at home to recover with post-op recover - NO history of  - Chronic pain requiring opiods  - Diabetes  - Coronary Artery Disease  - Heart failure  - Heart attack  - Stroke  - DVT/VTE  - Cardiac arrhythmia  - Respiratory Failure/COPD  - Renal failure  - Anemia  - Advanced Liver disease    Therapy Plans: HEP Disposition: Home with Sister Planned DVT Prophylaxis: Aspirin '325mg'$  DME Needed: None PCP: Allie Bossier, NP (Patient contacting for clearance) Cardiology: Dr. Garen Lah (Patient contacting for clearance) Pulmonologist:  TXA: IV Allergies:   Acetaminophen (swelling, moderate), Albuterol (dyspnea, severe), Aripiprazole,  Cefaclor, Diclofenac, Ibuprofen (swelling), Naproxen (swelling), Rosuvastatin, Tramadol, Triazolam, Zinc  Adhesives can cause itching - Paper tape.   Anesthesia Concerns:  BMI: 35.8 Last HgbA1c: 6.3  Pharm[facility] [place]  - Patient was instructed on what medications to stop prior to surgery. - Follow-up visit in 2 weeks with Dr. Wynelle Link - Begin physical therapy following surgery - Pre-operative lab work as pre-surgical testing - Prescriptions will be provided in hospital at time of discharge  Fenton Foy, [facility], PA-C Orthopedic Surgery [facility]  [facility]

## 2021-06-30 NOTE — Anesthesia Preprocedure Evaluation (Addendum)
Anesthesia Evaluation  Patient identified by MRN, date of birth, ID band Patient awake    Reviewed: Allergy & Precautions, NPO status , Patient's Chart, lab work & pertinent test results  History of Anesthesia Complications (+) PONV  Airway Mallampati: II  TM Distance: >3 FB Neck ROM: Full    Dental   Pulmonary asthma , sleep apnea , COPD, Patient abstained from smoking., former smoker,    Pulmonary exam normal        Cardiovascular negative cardio ROS Normal cardiovascular exam     Neuro/Psych  Neuromuscular disease (Multiple sclerosis)    GI/Hepatic Neg liver ROS, GERD  ,  Endo/Other  diabetes, Type 2  Renal/GU Renal disease     Musculoskeletal  (+) Arthritis ,   Abdominal   Peds  Hematology negative hematology ROS (+)   Anesthesia Other Findings   Reproductive/Obstetrics                           Lab Results  Component Value Date   WBC 9.3 06/23/2021   HGB 13.2 06/23/2021   HCT 40.1 06/23/2021   MCV 92.8 06/23/2021   PLT 492 (H) 06/23/2021   Lab Results  Component Value Date   CREATININE 0.93 06/23/2021   BUN 25 (H) 06/23/2021   NA 141 06/23/2021   K 4.4 06/23/2021   CL 106 06/23/2021   CO2 24 06/23/2021    Anesthesia Physical Anesthesia Plan  ASA: 3  Anesthesia Plan: General   Post-op Pain Management:    Induction: Intravenous  PONV Risk Score and Plan: 4 or greater and Ondansetron, Dexamethasone, TIVA, Propofol infusion and Treatment may vary due to age or medical condition  Airway Management Planned: LMA  Additional Equipment: None  Intra-op Plan:   Post-operative Plan: Extubation in OR  Informed Consent: I have reviewed the patients History and Physical, chart, labs and discussed the procedure including the risks, benefits and alternatives for the proposed anesthesia with the patient or authorized representative who has indicated his/her understanding and  acceptance.     Dental advisory given  Plan Discussed with: CRNA  Anesthesia Plan Comments:       Anesthesia Quick Evaluation

## 2021-07-01 LAB — SARS CORONAVIRUS 2 (TAT 6-24 HRS): SARS Coronavirus 2: NEGATIVE

## 2021-07-02 ENCOUNTER — Encounter (HOSPITAL_COMMUNITY): Payer: Self-pay | Admitting: Orthopedic Surgery

## 2021-07-02 ENCOUNTER — Observation Stay (HOSPITAL_COMMUNITY): Payer: PPO

## 2021-07-02 ENCOUNTER — Ambulatory Visit (HOSPITAL_COMMUNITY): Payer: PPO

## 2021-07-02 ENCOUNTER — Ambulatory Visit (HOSPITAL_COMMUNITY): Payer: PPO | Admitting: Anesthesiology

## 2021-07-02 ENCOUNTER — Ambulatory Visit (HOSPITAL_COMMUNITY): Payer: PPO | Admitting: Physician Assistant

## 2021-07-02 ENCOUNTER — Other Ambulatory Visit: Payer: Self-pay

## 2021-07-02 ENCOUNTER — Observation Stay (HOSPITAL_COMMUNITY)
Admission: RE | Admit: 2021-07-02 | Discharge: 2021-07-03 | Disposition: A | Discharge status: Home / Self Care | Payer: PPO | Attending: Orthopedic Surgery | Admitting: Orthopedic Surgery

## 2021-07-02 ENCOUNTER — Encounter (HOSPITAL_COMMUNITY)
Admission: RE | Disposition: A | Discharge status: Home / Self Care | Payer: Self-pay | Source: Home / Self Care | Attending: Orthopedic Surgery

## 2021-07-02 ENCOUNTER — Telehealth (HOSPITAL_COMMUNITY): Payer: PPO | Admitting: Psychiatry

## 2021-07-02 DIAGNOSIS — Z7982 Long term (current) use of aspirin: Secondary | ICD-10-CM | POA: Insufficient documentation

## 2021-07-02 DIAGNOSIS — Z96641 Presence of right artificial hip joint: Secondary | ICD-10-CM | POA: Diagnosis not present

## 2021-07-02 DIAGNOSIS — M1611 Unilateral primary osteoarthritis, right hip: Principal | ICD-10-CM | POA: Diagnosis present

## 2021-07-02 DIAGNOSIS — E119 Type 2 diabetes mellitus without complications: Secondary | ICD-10-CM | POA: Diagnosis not present

## 2021-07-02 DIAGNOSIS — J449 Chronic obstructive pulmonary disease, unspecified: Secondary | ICD-10-CM | POA: Insufficient documentation

## 2021-07-02 DIAGNOSIS — Z87891 Personal history of nicotine dependence: Secondary | ICD-10-CM | POA: Insufficient documentation

## 2021-07-02 DIAGNOSIS — Z419 Encounter for procedure for purposes other than remedying health state, unspecified: Secondary | ICD-10-CM

## 2021-07-02 DIAGNOSIS — J45909 Unspecified asthma, uncomplicated: Secondary | ICD-10-CM | POA: Insufficient documentation

## 2021-07-02 DIAGNOSIS — Z96642 Presence of left artificial hip joint: Secondary | ICD-10-CM | POA: Diagnosis not present

## 2021-07-02 DIAGNOSIS — Z79899 Other long term (current) drug therapy: Secondary | ICD-10-CM | POA: Insufficient documentation

## 2021-07-02 DIAGNOSIS — M169 Osteoarthritis of hip, unspecified: Secondary | ICD-10-CM | POA: Diagnosis present

## 2021-07-02 DIAGNOSIS — Z96649 Presence of unspecified artificial hip joint: Secondary | ICD-10-CM

## 2021-07-02 DIAGNOSIS — G4733 Obstructive sleep apnea (adult) (pediatric): Secondary | ICD-10-CM | POA: Diagnosis not present

## 2021-07-02 DIAGNOSIS — E559 Vitamin D deficiency, unspecified: Secondary | ICD-10-CM | POA: Diagnosis not present

## 2021-07-02 DIAGNOSIS — Z471 Aftercare following joint replacement surgery: Secondary | ICD-10-CM | POA: Diagnosis not present

## 2021-07-02 HISTORY — PX: TOTAL HIP ARTHROPLASTY: SHX124

## 2021-07-02 LAB — GLUCOSE, CAPILLARY
Glucose-Capillary: 117 mg/dL — ABNORMAL HIGH (ref 70–99)
Glucose-Capillary: 178 mg/dL — ABNORMAL HIGH (ref 70–99)

## 2021-07-02 LAB — HEMOGLOBIN A1C
Hgb A1c MFr Bld: 6.6 % — ABNORMAL HIGH (ref 4.8–5.6)
Mean Plasma Glucose: 142.72 mg/dL

## 2021-07-02 LAB — TYPE AND SCREEN
ABO/RH(D): O POS
Antibody Screen: NEGATIVE

## 2021-07-02 LAB — ABO/RH: ABO/RH(D): O POS

## 2021-07-02 IMAGING — DX DG PORTABLE PELVIS
1 series · 1 of 1 positions shown · non-contrast
Comparison: None.

CLINICAL DATA: Right hip replacement

EXAM:
PORTABLE PELVIS 1-2 VIEWS; OPERATIVE RIGHT HIP WITH PELVIS

[pelvis ap]
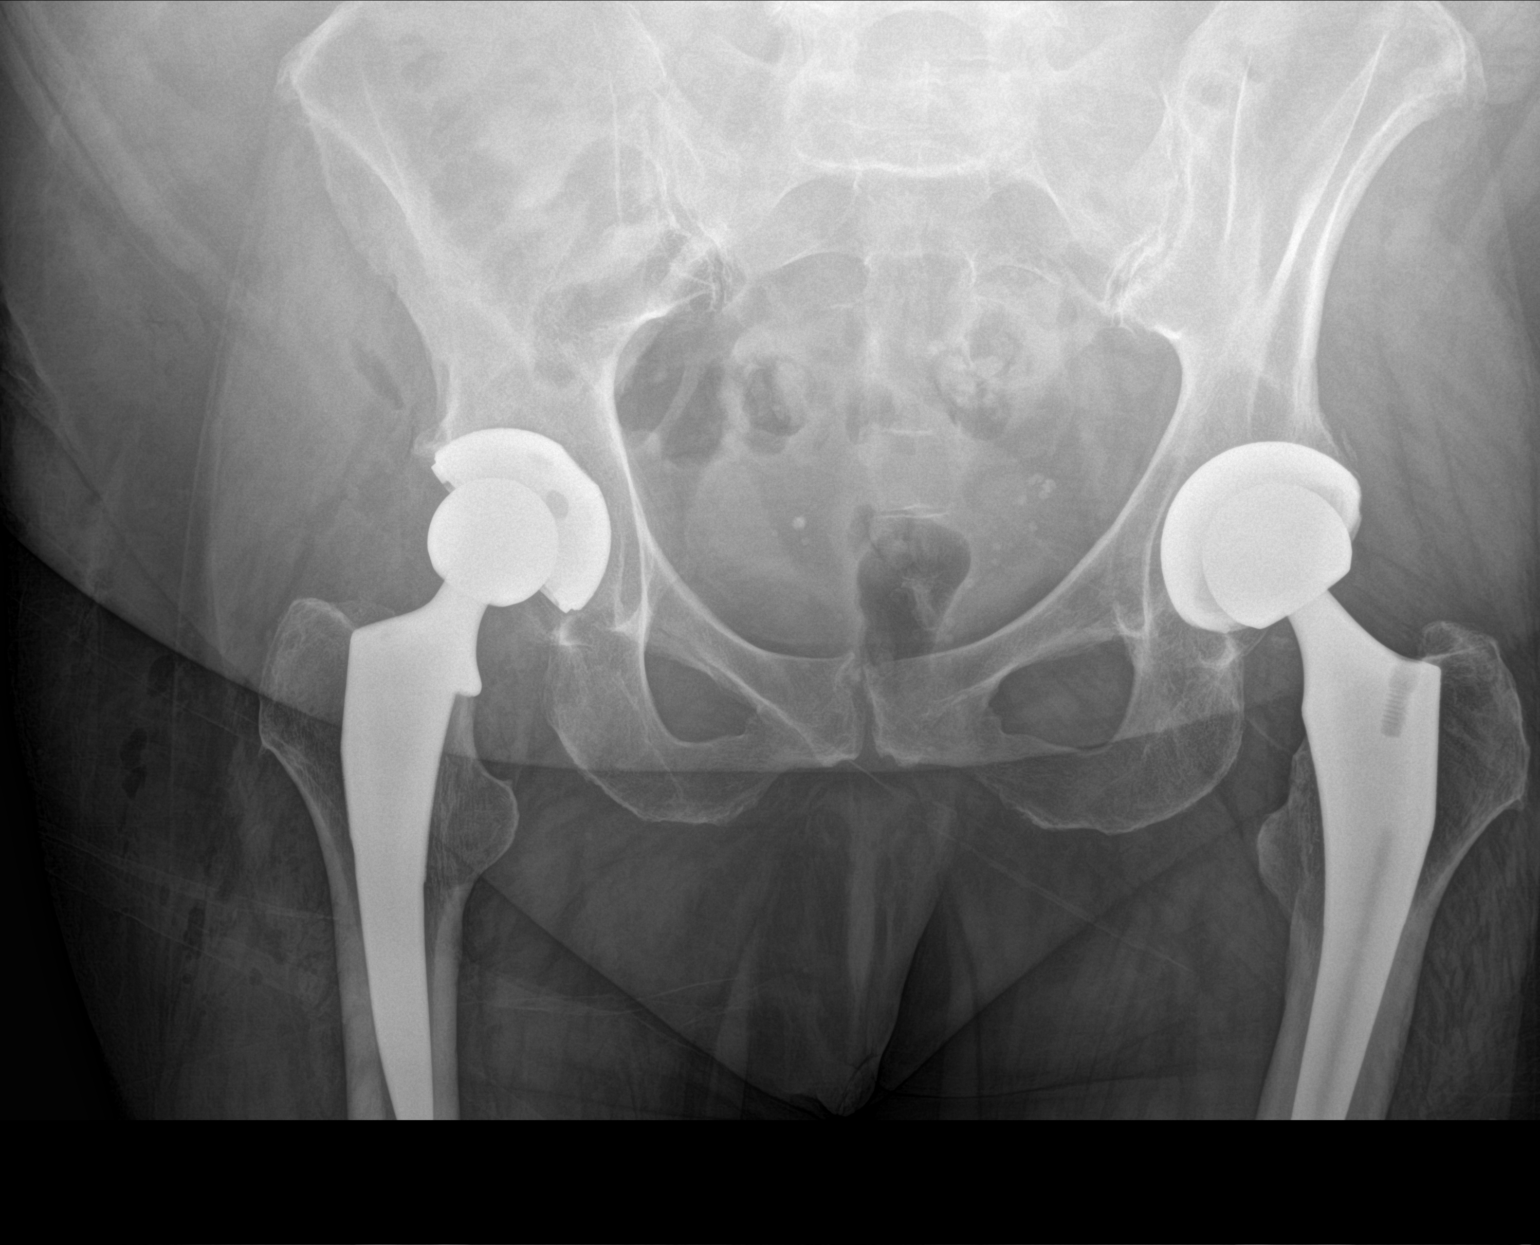

[1 of 1 positions shown; findings below may reference images not displayed]

FINDINGS: C-arm images show right hip replacement. Components appear well
positioned without radiographically detectable complication.
IMPRESSION: Good postoperative appearance.

## 2021-07-02 IMAGING — RF DG HIP (WITH PELVIS) OPERATIVE*R*
1 series · 2 of 2 positions shown · non-contrast
Comparison: None.

CLINICAL DATA: Right hip replacement

EXAM:
PORTABLE PELVIS 1-2 VIEWS; OPERATIVE RIGHT HIP WITH PELVIS

[Series 1: unknown protocol · 0.20mm/px · 2 of 2 slices shown]
[im 1/2]
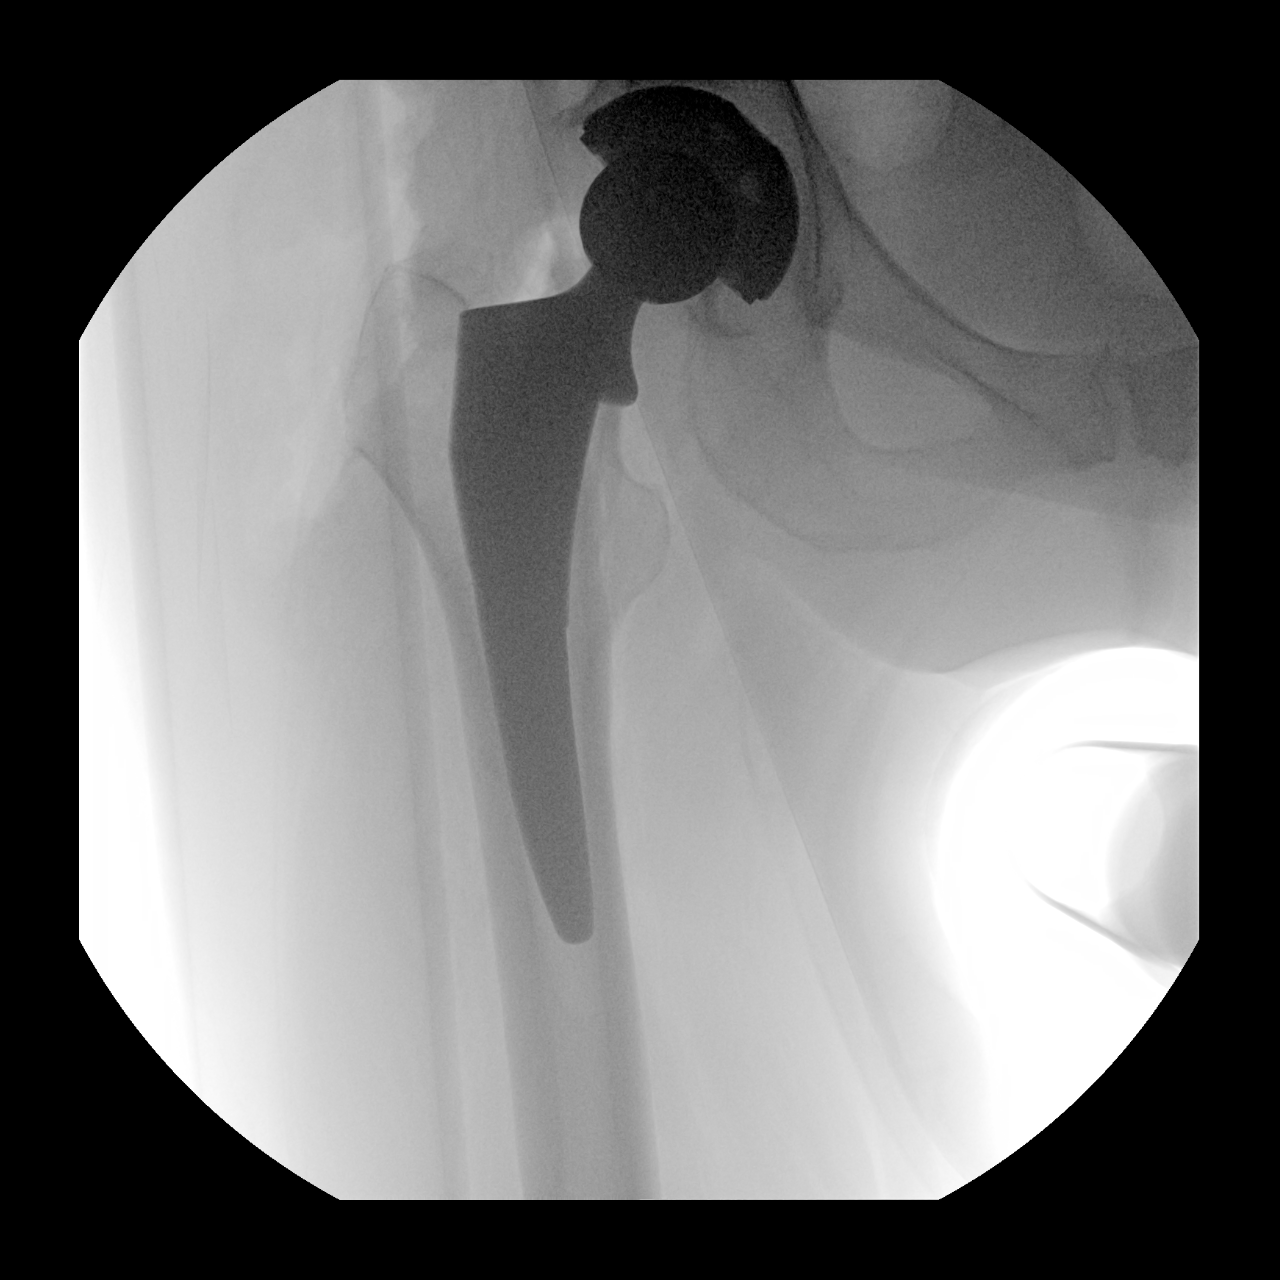
[im 2/2]
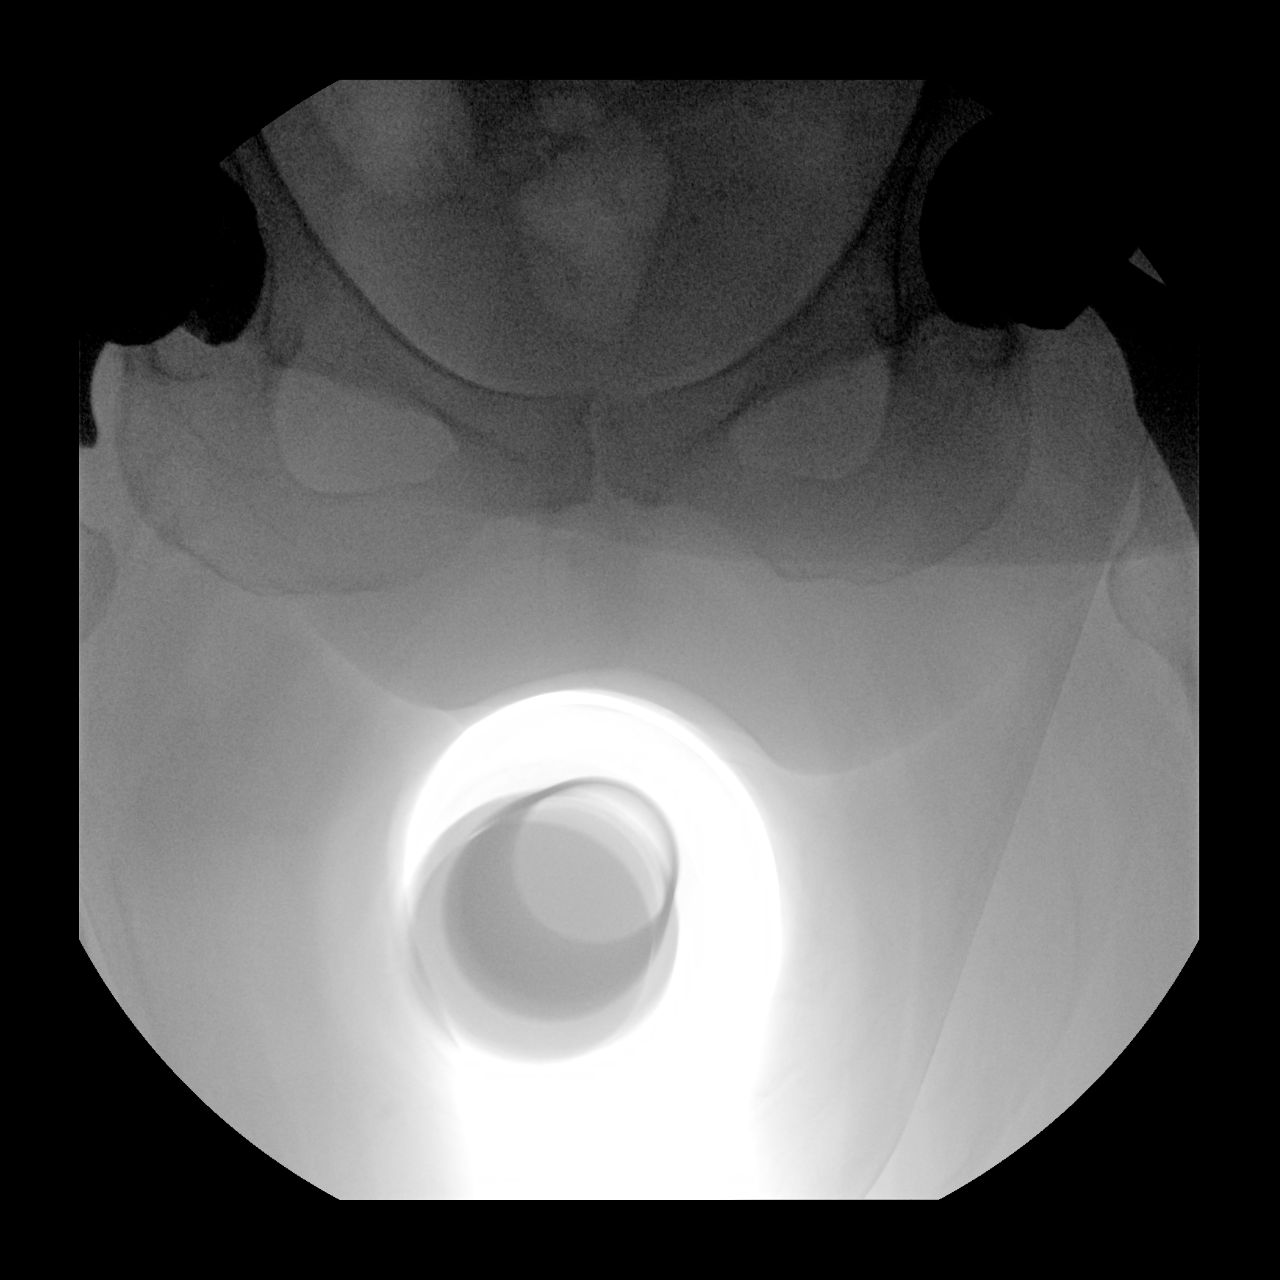

[2 of 2 positions shown; findings below may reference images not displayed]

FINDINGS: C-arm images show right hip replacement. Components appear well
positioned without radiographically detectable complication.
IMPRESSION: Good postoperative appearance.

## 2021-07-02 SURGERY — ARTHROPLASTY, HIP, TOTAL, ANTERIOR APPROACH
Anesthesia: General | Site: Hip | Laterality: Right

## 2021-07-02 MED ORDER — ACETAMINOPHEN 10 MG/ML IV SOLN
INTRAVENOUS | Status: AC
  Filled 2021-07-02: qty 100

## 2021-07-02 MED ORDER — AMISULPRIDE (ANTIEMETIC) 5 MG/2ML IV SOLN: 10.0000 mg | Freq: Once | INTRAVENOUS | Status: DC | PRN

## 2021-07-02 MED ORDER — FENTANYL CITRATE (PF) 100 MCG/2ML IJ SOLN
INTRAMUSCULAR | Status: DC | PRN
  Administered 2021-07-02: 50 ug via INTRAVENOUS
  Administered 2021-07-02 (×3): 25 ug via INTRAVENOUS
  Administered 2021-07-02: 50 ug via INTRAVENOUS
  Administered 2021-07-02 (×2): 25 ug via INTRAVENOUS
  Administered 2021-07-02: 50 ug via INTRAVENOUS
  Administered 2021-07-02: 25 ug via INTRAVENOUS

## 2021-07-02 MED ORDER — DEXMEDETOMIDINE (PRECEDEX) IN NS 20 MCG/5ML (4 MCG/ML) IV SYRINGE
PREFILLED_SYRINGE | INTRAVENOUS | Status: AC
  Filled 2021-07-02: qty 5

## 2021-07-02 MED ORDER — METOCLOPRAMIDE HCL 5 MG PO TABS: 5.0000 mg | ORAL_TABLET | Freq: Three times a day (TID) | ORAL | Status: DC | PRN

## 2021-07-02 MED ORDER — BUPIVACAINE-EPINEPHRINE (PF) 0.25% -1:200000 IJ SOLN
INTRAMUSCULAR | Status: AC
  Filled 2021-07-02: qty 30

## 2021-07-02 MED ORDER — POLYETHYLENE GLYCOL 3350 17 G PO PACK: 17.0000 g | PACK | Freq: Every day | ORAL | Status: DC | PRN

## 2021-07-02 MED ORDER — FENTANYL CITRATE (PF) 100 MCG/2ML IJ SOLN
INTRAMUSCULAR | Status: AC
  Filled 2021-07-02: qty 2

## 2021-07-02 MED ORDER — PROPOFOL 10 MG/ML IV BOLUS
INTRAVENOUS | Status: DC | PRN
  Administered 2021-07-02: 200 mg via INTRAVENOUS

## 2021-07-02 MED ORDER — CHLORHEXIDINE GLUCONATE 0.12 % MT SOLN
15.0000 mL | Freq: Once | OROMUCOSAL | Status: AC
  Administered 2021-07-02: 15 mL via OROMUCOSAL

## 2021-07-02 MED ORDER — POVIDONE-IODINE 10 % EX SWAB
2.0000 "application " | Freq: Once | CUTANEOUS | Status: AC
  Administered 2021-07-02: 2 via TOPICAL

## 2021-07-02 MED ORDER — DEXAMETHASONE SODIUM PHOSPHATE 10 MG/ML IJ SOLN
INTRAMUSCULAR | Status: AC
  Filled 2021-07-02: qty 1

## 2021-07-02 MED ORDER — METOPROLOL SUCCINATE ER 50 MG PO TB24
50.0000 mg | ORAL_TABLET | Freq: Every day | ORAL | Status: DC
  Administered 2021-07-02 – 2021-07-03 (×2): 50 mg via ORAL
  Filled 2021-07-02 (×2): qty 1

## 2021-07-02 MED ORDER — ORAL CARE MOUTH RINSE: 15.0000 mL | Freq: Once | OROMUCOSAL | Status: AC

## 2021-07-02 MED ORDER — LEVALBUTEROL TARTRATE 45 MCG/ACT IN AERO: 1.0000 | INHALATION_SPRAY | Freq: Four times a day (QID) | RESPIRATORY_TRACT | Status: DC | PRN

## 2021-07-02 MED ORDER — LIDOCAINE 2% (20 MG/ML) 5 ML SYRINGE
INTRAMUSCULAR | Status: DC | PRN
  Administered 2021-07-02: 60 mg via INTRAVENOUS

## 2021-07-02 MED ORDER — 0.9 % SODIUM CHLORIDE (POUR BTL) OPTIME
TOPICAL | Status: DC | PRN
  Administered 2021-07-02: 1000 mL

## 2021-07-02 MED ORDER — DEXAMETHASONE SODIUM PHOSPHATE 10 MG/ML IJ SOLN
10.0000 mg | Freq: Once | INTRAMUSCULAR | Status: AC
  Administered 2021-07-03: 10 mg via INTRAVENOUS
  Filled 2021-07-02: qty 1

## 2021-07-02 MED ORDER — METHOCARBAMOL 500 MG IVPB - SIMPLE MED
INTRAVENOUS | Status: AC
  Filled 2021-07-02: qty 50

## 2021-07-02 MED ORDER — DESIPRAMINE HCL 25 MG PO TABS
25.0000 mg | ORAL_TABLET | Freq: Every day | ORAL | Status: DC
  Administered 2021-07-02: 25 mg via ORAL
  Filled 2021-07-02: qty 1

## 2021-07-02 MED ORDER — CEFAZOLIN SODIUM-DEXTROSE 2-4 GM/100ML-% IV SOLN
2.0000 g | INTRAVENOUS | Status: AC
  Administered 2021-07-02: 2 g via INTRAVENOUS
  Filled 2021-07-02: qty 100

## 2021-07-02 MED ORDER — HYDROMORPHONE HCL 1 MG/ML IJ SOLN
0.2500 mg | INTRAMUSCULAR | Status: DC | PRN
  Administered 2021-07-02 (×4): 0.5 mg via INTRAVENOUS

## 2021-07-02 MED ORDER — ONDANSETRON HCL 4 MG/2ML IJ SOLN: 4.0000 mg | Freq: Four times a day (QID) | INTRAMUSCULAR | Status: DC | PRN

## 2021-07-02 MED ORDER — MIDAZOLAM HCL 5 MG/5ML IJ SOLN
INTRAMUSCULAR | Status: DC | PRN
  Administered 2021-07-02: 2 mg via INTRAVENOUS

## 2021-07-02 MED ORDER — BISACODYL 10 MG RE SUPP: 10.0000 mg | Freq: Every day | RECTAL | Status: DC | PRN

## 2021-07-02 MED ORDER — SODIUM CHLORIDE 0.9 % IV SOLN: INTRAVENOUS | Status: DC

## 2021-07-02 MED ORDER — ONDANSETRON HCL 4 MG PO TABS: 4.0000 mg | ORAL_TABLET | Freq: Four times a day (QID) | ORAL | Status: DC | PRN

## 2021-07-02 MED ORDER — TRAZODONE HCL 100 MG PO TABS
100.0000 mg | ORAL_TABLET | Freq: Every evening | ORAL | Status: DC | PRN
  Administered 2021-07-02: 100 mg via ORAL
  Filled 2021-07-02: qty 1

## 2021-07-02 MED ORDER — HYDROXYZINE HCL 50 MG PO TABS: 100.0000 mg | ORAL_TABLET | Freq: Every day | ORAL | Status: DC

## 2021-07-02 MED ORDER — LAMOTRIGINE 100 MG PO TABS
300.0000 mg | ORAL_TABLET | Freq: Every day | ORAL | Status: DC
  Administered 2021-07-02: 300 mg via ORAL
  Filled 2021-07-02: qty 3

## 2021-07-02 MED ORDER — KETOROLAC TROMETHAMINE 30 MG/ML IJ SOLN
30.0000 mg | Freq: Once | INTRAMUSCULAR | Status: AC
  Administered 2021-07-02: 30 mg via INTRAVENOUS

## 2021-07-02 MED ORDER — MOMETASONE FURO-FORMOTEROL FUM 100-5 MCG/ACT IN AERO
2.0000 | INHALATION_SPRAY | Freq: Two times a day (BID) | RESPIRATORY_TRACT | Status: DC
  Filled 2021-07-02: qty 8.8

## 2021-07-02 MED ORDER — FAMOTIDINE 20 MG PO TABS
20.0000 mg | ORAL_TABLET | Freq: Two times a day (BID) | ORAL | Status: DC
  Administered 2021-07-02 – 2021-07-03 (×2): 20 mg via ORAL
  Filled 2021-07-02 (×2): qty 1

## 2021-07-02 MED ORDER — DULAGLUTIDE 1.5 MG/0.5ML ~~LOC~~ SOAJ: 1.5000 mg | SUBCUTANEOUS | Status: DC

## 2021-07-02 MED ORDER — DEXMEDETOMIDINE (PRECEDEX) IN NS 20 MCG/5ML (4 MCG/ML) IV SYRINGE
PREFILLED_SYRINGE | INTRAVENOUS | Status: DC | PRN
  Administered 2021-07-02: 4 ug via INTRAVENOUS
  Administered 2021-07-02 (×3): 8 ug via INTRAVENOUS

## 2021-07-02 MED ORDER — LACTATED RINGERS IV SOLN: INTRAVENOUS | Status: DC

## 2021-07-02 MED ORDER — METOCLOPRAMIDE HCL 5 MG/ML IJ SOLN: 5.0000 mg | Freq: Three times a day (TID) | INTRAMUSCULAR | Status: DC | PRN

## 2021-07-02 MED ORDER — LIDOCAINE 2% (20 MG/ML) 5 ML SYRINGE
INTRAMUSCULAR | Status: AC
  Filled 2021-07-02: qty 5

## 2021-07-02 MED ORDER — WATER FOR IRRIGATION, STERILE IR SOLN
Status: DC | PRN
  Administered 2021-07-02 (×2): 1000 mL

## 2021-07-02 MED ORDER — FENOFIBRATE 160 MG PO TABS
160.0000 mg | ORAL_TABLET | Freq: Every day | ORAL | Status: DC
  Administered 2021-07-03: 160 mg via ORAL
  Filled 2021-07-02: qty 1

## 2021-07-02 MED ORDER — ACETAMINOPHEN 10 MG/ML IV SOLN
1000.0000 mg | Freq: Once | INTRAVENOUS | Status: DC | PRN
  Administered 2021-07-02: 1000 mg via INTRAVENOUS

## 2021-07-02 MED ORDER — ONDANSETRON HCL 4 MG/2ML IJ SOLN
INTRAMUSCULAR | Status: AC
  Filled 2021-07-02: qty 2

## 2021-07-02 MED ORDER — MENTHOL 3 MG MT LOZG: 1.0000 | LOZENGE | OROMUCOSAL | Status: DC | PRN

## 2021-07-02 MED ORDER — ONDANSETRON HCL 4 MG/2ML IJ SOLN
INTRAMUSCULAR | Status: DC | PRN
  Administered 2021-07-02: 4 mg via INTRAVENOUS

## 2021-07-02 MED ORDER — LABETALOL HCL 5 MG/ML IV SOLN
INTRAVENOUS | Status: DC | PRN
  Administered 2021-07-02 (×2): 5 mg via INTRAVENOUS

## 2021-07-02 MED ORDER — CEFAZOLIN SODIUM-DEXTROSE 2-4 GM/100ML-% IV SOLN
2.0000 g | Freq: Four times a day (QID) | INTRAVENOUS | Status: AC
  Administered 2021-07-02 (×2): 2 g via INTRAVENOUS
  Filled 2021-07-02 (×2): qty 100

## 2021-07-02 MED ORDER — BUPIVACAINE-EPINEPHRINE (PF) 0.25% -1:200000 IJ SOLN
INTRAMUSCULAR | Status: DC | PRN
  Administered 2021-07-02: 30 mL via PERINEURAL

## 2021-07-02 MED ORDER — LABETALOL HCL 5 MG/ML IV SOLN
INTRAVENOUS | Status: AC
  Filled 2021-07-02: qty 4

## 2021-07-02 MED ORDER — OXYCODONE HCL 5 MG PO TABS
5.0000 mg | ORAL_TABLET | ORAL | Status: DC | PRN
  Administered 2021-07-02 – 2021-07-03 (×5): 10 mg via ORAL
  Filled 2021-07-02 (×5): qty 2

## 2021-07-02 MED ORDER — DULOXETINE HCL 20 MG PO CPEP
40.0000 mg | ORAL_CAPSULE | Freq: Every day | ORAL | Status: DC
  Administered 2021-07-02: 40 mg via ORAL
  Filled 2021-07-02: qty 2

## 2021-07-02 MED ORDER — MORPHINE SULFATE (PF) 2 MG/ML IV SOLN
0.5000 mg | INTRAVENOUS | Status: DC | PRN
  Administered 2021-07-02: 1 mg via INTRAVENOUS
  Filled 2021-07-02 (×2): qty 1

## 2021-07-02 MED ORDER — RIVAROXABAN 10 MG PO TABS
10.0000 mg | ORAL_TABLET | Freq: Every day | ORAL | Status: DC
  Administered 2021-07-03: 10 mg via ORAL
  Filled 2021-07-02: qty 1

## 2021-07-02 MED ORDER — METHOCARBAMOL 500 MG IVPB - SIMPLE MED
500.0000 mg | Freq: Four times a day (QID) | INTRAVENOUS | Status: DC | PRN
  Administered 2021-07-02: 500 mg via INTRAVENOUS
  Filled 2021-07-02: qty 50

## 2021-07-02 MED ORDER — METHOCARBAMOL 500 MG PO TABS
500.0000 mg | ORAL_TABLET | Freq: Four times a day (QID) | ORAL | Status: DC | PRN
  Administered 2021-07-02 – 2021-07-03 (×3): 500 mg via ORAL
  Filled 2021-07-02 (×3): qty 1

## 2021-07-02 MED ORDER — MIDAZOLAM HCL 2 MG/2ML IJ SOLN
INTRAMUSCULAR | Status: AC
  Filled 2021-07-02: qty 2

## 2021-07-02 MED ORDER — TRANEXAMIC ACID-NACL 1000-0.7 MG/100ML-% IV SOLN
1000.0000 mg | INTRAVENOUS | Status: AC
  Administered 2021-07-02: 1000 mg via INTRAVENOUS
  Filled 2021-07-02: qty 100

## 2021-07-02 MED ORDER — PANTOPRAZOLE SODIUM 40 MG PO TBEC
40.0000 mg | DELAYED_RELEASE_TABLET | Freq: Every day | ORAL | Status: DC
  Administered 2021-07-03: 40 mg via ORAL
  Filled 2021-07-02: qty 1

## 2021-07-02 MED ORDER — MECLIZINE HCL 25 MG PO TABS: 12.5000 mg | ORAL_TABLET | Freq: Three times a day (TID) | ORAL | Status: DC | PRN

## 2021-07-02 MED ORDER — PROPOFOL 500 MG/50ML IV EMUL
INTRAVENOUS | Status: DC | PRN
  Administered 2021-07-02: 150 ug/kg/min via INTRAVENOUS

## 2021-07-02 MED ORDER — PROPOFOL 1000 MG/100ML IV EMUL
INTRAVENOUS | Status: AC
  Filled 2021-07-02: qty 100

## 2021-07-02 MED ORDER — PROPOFOL 10 MG/ML IV BOLUS
INTRAVENOUS | Status: AC
  Filled 2021-07-02: qty 20

## 2021-07-02 MED ORDER — HYDROMORPHONE HCL 1 MG/ML IJ SOLN
INTRAMUSCULAR | Status: AC
  Filled 2021-07-02: qty 2

## 2021-07-02 MED ORDER — PHENOL 1.4 % MT LIQD: 1.0000 | OROMUCOSAL | Status: DC | PRN

## 2021-07-02 MED ORDER — DEXAMETHASONE SODIUM PHOSPHATE 10 MG/ML IJ SOLN
8.0000 mg | Freq: Once | INTRAMUSCULAR | Status: AC
  Administered 2021-07-02: 4 mg via INTRAVENOUS

## 2021-07-02 MED ORDER — KETOROLAC TROMETHAMINE 30 MG/ML IJ SOLN
INTRAMUSCULAR | Status: AC
  Filled 2021-07-02: qty 1

## 2021-07-02 MED ORDER — DOCUSATE SODIUM 100 MG PO CAPS
100.0000 mg | ORAL_CAPSULE | Freq: Two times a day (BID) | ORAL | Status: DC
  Administered 2021-07-02 – 2021-07-03 (×2): 100 mg via ORAL
  Filled 2021-07-02 (×2): qty 1

## 2021-07-02 MED ORDER — TEMAZEPAM 15 MG PO CAPS
60.0000 mg | ORAL_CAPSULE | Freq: Every evening | ORAL | Status: DC | PRN
  Administered 2021-07-02: 60 mg via ORAL
  Filled 2021-07-02: qty 4

## 2021-07-02 MED ORDER — LIP MEDEX EX OINT
TOPICAL_OINTMENT | CUTANEOUS | Status: AC
  Filled 2021-07-02: qty 7

## 2021-07-02 SURGICAL SUPPLY — 44 items
BAG COUNTER SPONGE SURGICOUNT (BAG) IMPLANT
BAG DECANTER FOR FLEXI CONT (MISCELLANEOUS) IMPLANT
BAG ZIPLOCK 12X15 (MISCELLANEOUS) IMPLANT
BALL HIP CERAMIC (Hips) ×1 IMPLANT
BLADE SAG 18X100X1.27 (BLADE) ×2 IMPLANT
CLSR STERI-STRIP ANTIMIC 1/2X4 (GAUZE/BANDAGES/DRESSINGS) ×2 IMPLANT
COVER PERINEAL POST (MISCELLANEOUS) ×2 IMPLANT
COVER SURGICAL LIGHT HANDLE (MISCELLANEOUS) ×2 IMPLANT
CUP ACETBLR 52 OD PINNACLE (Hips) ×2 IMPLANT
DECANTER SPIKE VIAL GLASS SM (MISCELLANEOUS) ×2 IMPLANT
DRAPE FOOT SWITCH (DRAPES) ×2 IMPLANT
DRAPE STERI IOBAN 125X83 (DRAPES) ×2 IMPLANT
DRAPE U-SHAPE 47X51 STRL (DRAPES) ×4 IMPLANT
DRSG AQUACEL AG ADV 3.5X10 (GAUZE/BANDAGES/DRESSINGS) ×2 IMPLANT
DURAPREP 26ML APPLICATOR (WOUND CARE) ×2 IMPLANT
ELECT REM PT RETURN 15FT ADLT (MISCELLANEOUS) ×2 IMPLANT
GLOVE SRG 8 PF TXTR STRL LF DI (GLOVE) ×1 IMPLANT
GLOVE SURG ENC MOIS LTX SZ6.5 (GLOVE) ×2 IMPLANT
GLOVE SURG ENC MOIS LTX SZ7 (GLOVE) ×2 IMPLANT
GLOVE SURG ENC MOIS LTX SZ8 (GLOVE) ×4 IMPLANT
GLOVE SURG UNDER POLY LF SZ7 (GLOVE) ×2 IMPLANT
GLOVE SURG UNDER POLY LF SZ8 (GLOVE) ×1
GLOVE SURG UNDER POLY LF SZ8.5 (GLOVE) IMPLANT
GOWN STRL REUS W/TWL LRG LVL3 (GOWN DISPOSABLE) ×4 IMPLANT
GOWN STRL REUS W/TWL XL LVL3 (GOWN DISPOSABLE) IMPLANT
HEAD FEMORAL 32 CERAMIC (Hips) ×2 IMPLANT
HIP BALL CERAMIC (Hips) ×2 IMPLANT
HOLDER FOLEY CATH W/STRAP (MISCELLANEOUS) IMPLANT
KIT TURNOVER KIT A (KITS) ×2 IMPLANT
LINER MARATHON NEUT +4X52X32 (Hips) ×2 IMPLANT
MANIFOLD NEPTUNE II (INSTRUMENTS) ×2 IMPLANT
PACK ANTERIOR HIP CUSTOM (KITS) ×2 IMPLANT
PENCIL SMOKE EVACUATOR COATED (MISCELLANEOUS) ×2 IMPLANT
STEM FEM ACTIS HIGH SZ7 (Stem) ×2 IMPLANT
STRIP CLOSURE SKIN 1/2X4 (GAUZE/BANDAGES/DRESSINGS) ×2 IMPLANT
SUT ETHIBOND NAB CT1 #1 30IN (SUTURE) ×2 IMPLANT
SUT MNCRL AB 4-0 PS2 18 (SUTURE) ×2 IMPLANT
SUT STRATAFIX 0 PDS 27 VIOLET (SUTURE) ×2
SUT VIC AB 2-0 CT1 27 (SUTURE) ×2
SUT VIC AB 2-0 CT1 TAPERPNT 27 (SUTURE) ×2 IMPLANT
SUTURE STRATFX 0 PDS 27 VIOLET (SUTURE) ×1 IMPLANT
SYR 50ML LL SCALE MARK (SYRINGE) IMPLANT
TRAY FOLEY MTR SLVR 16FR STAT (SET/KITS/TRAYS/PACK) IMPLANT
TUBE SUCTION HIGH CAP CLEAR NV (SUCTIONS) ×2 IMPLANT

## 2021-07-02 NOTE — Transfer of Care (Signed)
Immediate Anesthesia Transfer of Care Note  Patient: Emily Moon  Procedure(s) Performed: TOTAL HIP ARTHROPLASTY ANTERIOR APPROACH (Right: Hip)  Patient Location: PACU  Anesthesia Type:General  Level of Consciousness: drowsy and patient cooperative  Airway & Oxygen Therapy: Patient Spontanous Breathing and Patient connected to face mask oxygen  Post-op Assessment: Report given to RN and Post -op Vital signs reviewed and stable  Post vital signs: Reviewed and stable  Last Vitals:  Vitals Value Taken Time  BP 134/70 07/02/21 1254  Temp    Pulse 92 07/02/21 1257  Resp 17 07/02/21 1257  SpO2 96 % 07/02/21 1257  Vitals shown include unvalidated device data.  Last Pain:  Vitals:   07/02/21 1006  TempSrc: Oral  PainSc:       Patients Stated Pain Goal: 4 ([id number])  Complications: No notable events documented.

## 2021-07-02 NOTE — Op Note (Signed)
OPERATIVE REPORT- TOTAL HIP ARTHROPLASTY   PREOPERATIVE DIAGNOSIS: Osteoarthritis of the Right hip.   POSTOPERATIVE DIAGNOSIS: Osteoarthritis of the Right  hip.   PROCEDURE: Right total hip arthroplasty, anterior approach.   SURGEON: Gaynelle Arabian, MD   ASSISTANT: Molli Barrows, PA-C  ANESTHESIA:  General  ESTIMATED BLOOD LOSS:-700 mL    DRAINS: Hemovac x1.   COMPLICATIONS: None   CONDITION: PACU - hemodynamically stable.   BRIEF CLINICAL NOTE: Emily Moon is a 60 y.o. female who has advanced end-  stage arthritis of their Right  hip with progressively worsening pain and  dysfunction.The patient has failed nonoperative management and presents for  total hip arthroplasty.   PROCEDURE IN DETAIL: After successful administration of spinal  anesthetic, the traction boots for the [facility] bed were placed on both  feet and the patient was placed onto the [facility] bed, boots placed into the leg  holders. The Right hip was then isolated from the perineum with plastic  drapes and prepped and draped in the usual sterile fashion. ASIS and  greater trochanter were marked and a oblique incision was made, starting  at about 1 cm lateral and 2 cm distal to the ASIS and coursing towards  the anterior cortex of the femur. The skin was cut with a 10 blade  through subcutaneous tissue to the level of the fascia overlying the  tensor fascia lata muscle. The fascia was then incised in line with the  incision at the junction of the anterior third and posterior 2/3rd. The  muscle was teased off the fascia and then the interval between the TFL  and the rectus was developed. The Hohmann retractor was then placed at  the top of the femoral neck over the capsule. The vessels overlying the  capsule were cauterized and the fat on top of the capsule was removed.  A Hohmann retractor was then placed anterior underneath the rectus  femoris to give exposure to the entire anterior capsule. A T-shaped   capsulotomy was performed. The edges were tagged and the femoral head  was identified.       Osteophytes are removed off the superior acetabulum.  The femoral neck was then cut in situ with an oscillating saw. Traction  was then applied to the left lower extremity utilizing the [facility]  traction. The femoral head was then removed. Retractors were placed  around the acetabulum and then circumferential removal of the labrum was  performed. Osteophytes were also removed. Reaming starts at 49 mm to  medialize and  Increased in 2 mm increments to 51 mm. We reamed in  approximately 40 degrees of abduction, 20 degrees anteversion. A 52 mm  pinnacle acetabular shell was then impacted in anatomic position under  fluoroscopic guidance with excellent purchase. We did not need to place  any additional dome screws. A 32 mm neutral + 4 marathon liner was then  placed into the acetabular shell.       The femoral lift was then placed along the lateral aspect of the femur  just distal to the vastus ridge. The leg was  externally rotated and capsule  was stripped off the inferior aspect of the femoral neck down to the  level of the lesser trochanter, this was done with electrocautery. The femur was lifted after this was performed. The  leg was then placed in an extended and adducted position essentially delivering the femur. We also removed the capsule superiorly and the piriformis from the piriformis  fossa to gain excellent exposure of the  proximal femur. Rongeur was used to remove some cancellous bone to get  into the lateral portion of the proximal femur for placement of the  initial starter reamer. The starter broaches was placed  the starter broach  and was shown to go down the center of the canal. Broaching  with the Actis system was then performed starting at size 0  coursing  Up to size 7. A size 7 had excellent torsional and rotational  and axial stability. The trial high offset neck was then placed   with a 32 + 1 trial head. The hip was then reduced. We confirmed that  the stem was in the canal both on AP and lateral x-rays. It also has excellent sizing. The hip was reduced with outstanding stability through full extension and full external rotation.. AP pelvis was taken and the leg lengths were measured and found to be equal. Hip was then dislocated again and the femoral head and neck removed. The  femoral broach was removed. Size 7 Actis stem with a high offset  neck was then impacted into the femur following native anteversion. Has  excellent purchase in the canal. Excellent torsional and rotational and  axial stability. It is confirmed to be in the canal on AP and lateral  fluoroscopic views. The 32 + 1 ceramic head was placed and the hip  reduced with outstanding stability. Again AP pelvis was taken and it  confirmed that the leg lengths were equal. The wound was then copiously  irrigated with saline solution and the capsule reattached and repaired  with Ethibond suture. 30 ml of .25% Bupivicaine was  injected into the capsule and into the edge of the tensor fascia lata as well as subcutaneous tissue. The fascia overlying the tensor fascia lata was then closed with a running #1 V-Loc. Subcu was closed with interrupted 2-0 Vicryl and subcuticular running 4-0 Monocryl. Incision was cleaned  and dried. Steri-Strips and a bulky sterile dressing applied. The patient was awakened and transported to  recovery in stable condition.        Please note that a surgical assistant was a medical necessity for this procedure to perform it in a safe and expeditious manner. Assistant was necessary to provide appropriate retraction of vital neurovascular structures and to prevent femoral fracture and allow for anatomic placement of the prosthesis.  Gaynelle Arabian, M.D.

## 2021-07-02 NOTE — Telephone Encounter (Signed)
Patient last seen in office on 06/04/2021. Last refilled on 03/24/2021 for #180 no refill. Not able to call patient and see if she needs is having hip replacement today.

## 2021-07-02 NOTE — Plan of Care (Signed)
Discussed with patient about plan of care for post-op day 0. ° ° °Will continue to monitor patient.  ° ° °SWhittemore, RN ° °

## 2021-07-02 NOTE — Interval H&P Note (Signed)
History and Physical Interval Note:  07/02/2021 9:54 AM  [name]  has presented today for surgery, with the diagnosis of right hip osteoarthritis.  The various methods of treatment have been discussed with the patient and family. After consideration of risks, benefits and other options for treatment, the patient has consented to  Procedure(s): TOTAL HIP ARTHROPLASTY ANTERIOR APPROACH (Right) as a surgical intervention.  The patient's history has been reviewed, patient examined, no change in status, stable for surgery.  I have reviewed the patient's chart and labs.  Questions were answered to the patient's satisfaction.     Pilar Plate Channie Bostick

## 2021-07-02 NOTE — Evaluation (Signed)
Physical Therapy Evaluation Patient Details Name: Emily Moon MRN: [id number] DOB: 17-Feb-1961 Today's Date: 07/02/2021   History of Present Illness  Pt is 60 yo female s/p R anterior THA on 07/02/21.  She has history including but not limited to DM2, psoriasis, COPD, OSA, bipolar, vertigo, back pain, OA, HLD, chronic pain, MS, and GERD  Clinical Impression  Pt is s/p anterior R THA resulting in the deficits listed below (see PT Problem List). Recently at baseline pt has had difficulty with ADLs and even short community ambulation due to hip pain.  Prior to hip pain (~4 months per pt) she could perform ADLs and community ambulation, still had mild difficulties with IADLs due to MS and back stenosis.  She lives alone but her sister plans to assist at initial d/c.  Pt does live in level apartment and is well equipped other than needs a BSC.  Today, she was anxious and somewhat limited due to being hot and has issues with overheating related to her MS.  Pt was able to transfer and ambulate 5' in room with min A.  Pt will benefit from skilled PT to increase their independence and safety with mobility to allow discharge to the venue listed below. In addition to PT, pt could also benefit from acute OT due to difficulties at baseline with ADLs due to her hip pain and MS.  She could also benefit from HHPT at d/c due to multiple comorbidities including chronic pain, MS, back stenosis.       Follow Up Recommendations Follow surgeon's recommendation for DC plan and follow-up therapies;Supervision for mobility/OOB;Home health PT    Equipment Recommendations  3in1 (PT)    Recommendations for Other Services OT consult     Precautions / Restrictions Precautions Precautions: Fall Restrictions Weight Bearing Restrictions: Yes RLE Weight Bearing: Weight bearing as tolerated      Mobility  Bed Mobility Overal bed mobility: Needs Assistance Bed Mobility: Supine to Sit     Supine to sit: Min  assist     General bed mobility comments: Min A for R LE and cues    Transfers Overall transfer level: Needs assistance Equipment used: Rolling walker (2 wheeled) Transfers: Sit to/from Stand Sit to Stand: Min guard         General transfer comment: min guard for safety with cues for hand placement  Ambulation/Gait Ambulation/Gait assistance: Min guard Gait Distance (Feet): 5 Feet Assistive device: Rolling walker (2 wheeled) Gait Pattern/deviations: Step-to pattern;Decreased stride length Gait velocity: decreased   General Gait Details: Steps to chair; limited due to pain and pt hot (w/ MS and some issues with overheating at times)  Stairs            Wheelchair Mobility    Modified Rankin (Stroke Patients Only)       Balance Overall balance assessment: Needs assistance Sitting-balance support: No upper extremity supported Sitting balance-Leahy Scale: Good     Standing balance support: No upper extremity supported;Bilateral upper extremity supported Standing balance-Leahy Scale: Fair Standing balance comment: RW to ambulate; no UE support static stand                             Pertinent Vitals/Pain Pain Assessment: 0-10 Pain Score: 3  Pain Location: R hip Pain Descriptors / Indicators: Discomfort;Sore Pain Intervention(s): Limited activity within patient's tolerance;Repositioned;Ice applied;Monitored during session;Premedicated before session    Home Living Family/patient expects to be discharged to:: Private residence Living  Arrangements: Alone Available Help at Discharge: Family;Available 24 hours/day (sister is going to stay with her) Type of Home: Apartment Home Access: Level entry     Home Layout: One level Home Equipment: Grab bars - tub/shower;Shower seat;Walker - 2 wheels;Walker - 4 wheels;Cane - single point;Cane - quad      Prior Function Level of Independence: Needs assistance   Gait / Transfers Assistance Needed: Pt with  hip pain over last 4 months that has significantly limited her to household ambulation and short community ambulation with assist and RW.  Prior to hip issues she could ambulate in community without AD  ADL's / Homemaking Assistance Needed: Pt was having help with lower body dressing due to hip; had help with cooking and cleaning due to hip, MS, spinal stenosis  Comments: Had difficulty with standing extended periods of time     Hand Dominance        Extremity/Trunk Assessment   Upper Extremity Assessment Upper Extremity Assessment: Overall WFL for tasks assessed    Lower Extremity Assessment Lower Extremity Assessment: RLE deficits/detail RLE Deficits / Details: Expected post op changes.  ROM: knee and ankle WFL, hip WFL but decreased compared to L due to post op; MMT: ankle 5/5, knee 3/5, hip 1/5 RLE Sensation: WNL    Cervical / Trunk Assessment Cervical / Trunk Assessment: Normal  Communication   Communication: No difficulties  Cognition Arousal/Alertness: Awake/alert Behavior During Therapy: Anxious (appears to be baseline) Overall Cognitive Status: Within Functional Limits for tasks assessed                                        General Comments General comments (skin integrity, edema, etc.):   At arrival, pt reports very hot and has issues overheating with MS.  Already has air down, ice pack on head and fan.  Got pt sitting, placed cold towel on her back, and provided multiple ice packs at end of session for cooling. Also, positioned in chair with blanket covering chair which pt reports felt better than vinyl bed.   Pt expressing interest in OT due to difficulty with ADLs at baseline - PT agrees.  Also, pt reports she was shocked that she would not get HH or outpt PT at d/c and would prefer have this to make sure she is progress.  Considering pt's comorbidities and difficulties at baseline - PT agrees.    Exercises Total Joint Exercises Ankle  Circles/Pumps: AROM;Both;10 reps;Supine   Assessment/Plan    PT Assessment Patient needs continued PT services  PT Problem List Decreased strength;Decreased mobility;Decreased safety awareness;Decreased range of motion;Decreased activity tolerance;Decreased balance;Decreased knowledge of use of DME;Pain       PT Treatment Interventions DME instruction;Therapeutic activities;Modalities;Gait training;Therapeutic exercise;Patient/family education;Stair training;Functional mobility training;Balance training    PT Goals (Current goals can be found in the Care Plan section)  Acute Rehab PT Goals Patient Stated Goal: return home PT Goal Formulation: With patient/family Time For Goal Achievement: 07/16/21 Potential to Achieve Goals: Good    Frequency 7X/week   Barriers to discharge        Co-evaluation               AM-PAC PT "6 Clicks" Mobility  Outcome Measure Help needed turning from your back to your side while in a flat bed without using bedrails?: A Little Help needed moving from lying on your back to sitting on the  side of a flat bed without using bedrails?: A Little Help needed moving to and from a bed to a chair (including a wheelchair)?: A Little Help needed standing up from a chair using your arms (e.g., wheelchair or bedside chair)?: A Little Help needed to walk in hospital room?: A Little Help needed climbing 3-5 steps with a railing? : A Little 6 Click Score: 18    End of Session Equipment Utilized During Treatment: Gait belt Activity Tolerance: Patient tolerated treatment well;Other (comment) (some limitations due to hot) Patient left: in chair;with chair alarm set;with family/visitor present;with call bell/phone within reach Nurse Communication: Mobility status PT Visit Diagnosis: Other abnormalities of gait and mobility (R26.89);Muscle weakness (generalized) (M62.81)    Time: [id number] PT Time Calculation (min) (ACUTE ONLY): 34 min   Charges:   PT  Evaluation $PT Eval Low Complexity: 1 Low PT Treatments $Therapeutic Activity: 8-22 mins        Abran Richard, PT Acute Rehab Services Pager [phone] [facility] [phone]   Karlton Lemon 07/02/2021, 5:19 PM

## 2021-07-02 NOTE — Telephone Encounter (Signed)
30 day supply sent. Will allow pcp to review if chronic medication

## 2021-07-02 NOTE — Anesthesia Procedure Notes (Signed)
Procedure Name: LMA Insertion Date/Time: 07/02/2021 11:21 AM Performed by: Genelle Bal, CRNA Pre-anesthesia Checklist: Patient identified, Emergency Drugs available, Suction available and Patient being monitored Patient Re-evaluated:Patient Re-evaluated prior to induction Oxygen Delivery Method: Circle system utilized Preoxygenation: Pre-oxygenation with 100% oxygen Induction Type: IV induction Ventilation: Mask ventilation without difficulty LMA: LMA inserted LMA Size: 4.0 Number of attempts: 1 Airway Equipment and Method: Bite block Placement Confirmation: positive ETCO2 Tube secured with: Tape Dental Injury: Teeth and Oropharynx as per pre-operative assessment

## 2021-07-02 NOTE — Anesthesia Postprocedure Evaluation (Signed)
Anesthesia Post Note  Patient: Emily Moon  Procedure(s) Performed: TOTAL HIP ARTHROPLASTY ANTERIOR APPROACH (Right: Hip)     Patient location during evaluation: PACU Anesthesia Type: General Level of consciousness: awake and alert Pain management: pain level controlled Vital Signs Assessment: post-procedure vital signs reviewed and stable Respiratory status: spontaneous breathing, nonlabored ventilation, respiratory function stable and patient connected to nasal cannula oxygen Cardiovascular status: blood pressure returned to baseline and stable Postop Assessment: no apparent nausea or vomiting Anesthetic complications: no   No notable events documented.  Last Vitals:  Vitals:   07/02/21 1522 07/02/21 1728  BP: 103/66 119/61  Pulse: 94 84  Resp: 16 17  Temp: 37.2 C 37.4 C  SpO2: 95% 100%    Last Pain:  Vitals:   07/02/21 1728  TempSrc: Oral  PainSc:                  Tiajuana Amass

## 2021-07-02 NOTE — Discharge Instructions (Addendum)
Frank Aluisio, MD Total Joint Specialist [facility] [address] [place], [place] [address] [phone]  ANTERIOR APPROACH TOTAL HIP REPLACEMENT POSTOPERATIVE DIRECTIONS     Hip Rehabilitation, Guidelines Following Surgery  The results of a hip operation are greatly improved after range of motion and muscle strengthening exercises. Follow all safety measures which are given to protect your hip. If any of these exercises cause increased pain or swelling in your joint, decrease the amount until you are comfortable again. Then slowly increase the exercises. Call your caregiver if you have problems or questions.   BLOOD CLOT PREVENTION Take a 10 mg Xarelto once a day for three weeks following surgery. Then take an 81 mg Aspirin once a day for three weeks. Then discontinue Aspirin. You may resume your vitamins/supplements once you have discontinued the Xarelto. Do not take any NSAIDs (Advil, Aleve, Ibuprofen, Meloxicam, etc.) until you have discontinued the Xarelto.   HOME CARE INSTRUCTIONS  Remove items at home which could result in a fall. This includes throw rugs or furniture in walking pathways.  ICE to the affected hip as frequently as 20-30 minutes an hour and then as needed for pain and swelling. Continue to use ice on the hip for pain and swelling from surgery. You may notice swelling that will progress down to the foot and ankle. This is normal after surgery. Elevate the leg when you are not up walking on it.   Continue to use the breathing machine which will help keep your temperature down.  It is common for your temperature to cycle up and down following surgery, especially at night when you are not up moving around and exerting yourself.  The breathing machine keeps your lungs expanded and your temperature down.  DIET You may resume your previous home diet once your are discharged from the hospital.  DRESSING / WOUND CARE / SHOWERING You have an  adhesive waterproof bandage over the incision. Leave this in place until your first follow-up appointment. Once you remove this you will not need to place another bandage.  You may begin showering 3 days following surgery, but do not submerge the incision under water.  ACTIVITY For the first 3-5 days, it is important to rest and keep the operative leg elevated. You should, as a general rule, rest for 50 minutes and walk/stretch for 10 minutes per hour. After 5 days, you may slowly increase activity as tolerated.  Perform the exercises you were provided twice a day for about 15-20 minutes each session. Begin these 2 days following surgery. Walk with your walker as instructed. Use the walker until you are comfortable transitioning to a cane. Walk with the cane in the opposite hand of the operative leg. You may discontinue the cane once you are comfortable and walking steadily. Avoid periods of inactivity such as sitting longer than an hour when not asleep. This helps prevent blood clots.  Do not drive a car for 6 weeks or until released by your surgeon.  Do not drive while taking narcotics.  TED HOSE STOCKINGS Wear the elastic stockings on both legs for three weeks following surgery during the day. You may remove them at night while sleeping.  WEIGHT BEARING Weight bearing as tolerated with assist device (walker, cane, etc) as directed, use it as long as suggested by your surgeon or therapist, typically at least 4-6 weeks.  POSTOPERATIVE CONSTIPATION PROTOCOL Constipation - defined medically as fewer than three stools per week and severe constipation as less   than one stool per week.  One of the most common issues patients have following surgery is constipation.  Even if you have a regular bowel pattern at home, your normal regimen is likely to be disrupted due to multiple reasons following surgery.  Combination of anesthesia, postoperative narcotics, change in appetite and fluid intake all can  affect your bowels.  In order to avoid complications following surgery, here are some recommendations in order to help you during your recovery period.  Colace (docusate) - Pick up an over-the-counter form of Colace or another stool softener and take twice a day as long as you are requiring postoperative pain medications.  Take with a full glass of water daily.  If you experience loose stools or diarrhea, hold the colace until you stool forms back up.  If your symptoms do not get better within 1 week or if they get worse, check with your doctor. Dulcolax (bisacodyl) - Pick up over-the-counter and take as directed by the product packaging as needed to assist with the movement of your bowels.  Take with a full glass of water.  Use this product as needed if not relieved by Colace only.  MiraLax (polyethylene glycol) - Pick up over-the-counter to have on hand.  MiraLax is a solution that will increase the amount of water in your bowels to assist with bowel movements.  Take as directed and can mix with a glass of water, juice, soda, coffee, or tea.  Take if you go more than two days without a movement.Do not use MiraLax more than once per day. Call your doctor if you are still constipated or irregular after using this medication for 7 days in a row.  If you continue to have problems with postoperative constipation, please contact the office for further assistance and recommendations.  If you experience "the worst abdominal pain ever" or develop nausea or vomiting, please contact the office immediatly for further recommendations for treatment.  ITCHING  If you experience itching with your medications, try taking only a single pain pill, or even half a pain pill at a time.  You can also use Benadryl over the counter for itching or also to help with sleep.   MEDICATIONS See your medication summary on the "After Visit Summary" that the nursing staff will review with you prior to discharge.  You may have some home  medications which will be placed on hold until you complete the course of blood thinner medication.  It is important for you to complete the blood thinner medication as prescribed by your surgeon.  Continue your approved medications as instructed at time of discharge.  PRECAUTIONS If you experience chest pain or shortness of breath - call 911 immediately for transfer to the hospital emergency department.  If you develop a fever greater that 101 F, purulent drainage from wound, increased redness or drainage from wound, foul odor from the wound/dressing, or calf pain - CONTACT YOUR SURGEON.                                                   FOLLOW-UP APPOINTMENTS Make sure you keep all of your appointments after your operation with your surgeon and caregivers. You should call the office at the above phone number and make an appointment for approximately two weeks after the date of your surgery or on the date  instructed by your surgeon outlined in the "After Visit Summary".  RANGE OF MOTION AND STRENGTHENING EXERCISES  These exercises are designed to help you keep full movement of your hip joint. Follow your caregiver's or physical therapist's instructions. Perform all exercises about fifteen times, three times per day or as directed. Exercise both hips, even if you have had only one joint replacement. These exercises can be done on a training (exercise) mat, on the floor, on a table or on a bed. Use whatever works the best and is most comfortable for you. Use music or television while you are exercising so that the exercises are a pleasant break in your day. This will make your life better with the exercises acting as a break in routine you can look forward to.  Lying on your back, slowly slide your foot toward your buttocks, raising your knee up off the floor. Then slowly slide your foot back down until your leg is straight again.  Lying on your back spread your legs as far apart as you can without causing  discomfort.  Lying on your side, raise your upper leg and foot straight up from the floor as far as is comfortable. Slowly lower the leg and repeat.  Lying on your back, tighten up the muscle in the front of your thigh (quadriceps muscles). You can do this by keeping your leg straight and trying to raise your heel off the floor. This helps strengthen the largest muscle supporting your knee.  Lying on your back, tighten up the muscles of your buttocks both with the legs straight and with the knee bent at a comfortable angle while keeping your heel on the floor.   POST-OPERATIVE OPIOID TAPER INSTRUCTIONS: It is important to wean off of your opioid medication as soon as possible. If you do not need pain medication after your surgery it is ok to stop day one. Opioids include: Codeine, Hydrocodone(Norco, Vicodin), Oxycodone(Percocet, oxycontin) and hydromorphone amongst others.  Long term and even short term use of opiods can cause: Increased pain response Dependence Constipation Depression Respiratory depression And more.  Withdrawal symptoms can include Flu like symptoms Nausea, vomiting And more Techniques to manage these symptoms Hydrate well Eat regular healthy meals Stay active Use relaxation techniques(deep breathing, meditating, yoga) Do Not substitute Alcohol to help with tapering If you have been on opioids for less than two weeks and do not have pain than it is ok to stop all together.  Plan to wean off of opioids This plan should start within one week post op of your joint replacement. Maintain the same interval or time between taking each dose and first decrease the dose.  Cut the total daily intake of opioids by one tablet each day Next start to increase the time between doses. The last dose that should be eliminated is the evening dose.   IF YOU ARE TRANSFERRED TO A SKILLED REHAB FACILITY If the patient is transferred to a skilled rehab facility following release from the  hospital, a list of the current medications will be sent to the facility for the patient to continue.  When discharged from the skilled rehab facility, please have the facility set up the patient's Home Health Physical Therapy prior to being released. Also, the skilled facility will be responsible for providing the patient with their medications at time of release from the facility to include their pain medication, the muscle relaxants, and their blood thinner medication. If the patient is still at the rehab facility at   time of the two week follow up appointment, the skilled rehab facility will also need to assist the patient in arranging follow up appointment in our office and any transportation needs.  MAKE SURE YOU:  Understand these instructions.  Get help right away if you are not doing well or get worse.    DENTAL ANTIBIOTICS:  In most cases prophylactic antibiotics for Dental procdeures after total joint surgery are not necessary.  Exceptions are as follows:  1. History of prior total joint infection  2. Severely immunocompromised (Organ Transplant, cancer chemotherapy, Rheumatoid biologic meds such as Humera)  3. Poorly controlled diabetes (A1C &gt; 8.0, blood glucose over 200)  If you have one of these conditions, contact your surgeon for an antibiotic prescription, prior to your dental procedure.    Pick up stool softner and laxative for home use following surgery while on pain medications. Do not submerge incision under water. Please use good hand washing techniques while changing dressing each day. May shower starting three days after surgery. Please use a clean towel to pat the incision dry following showers. Continue to use ice for pain and swelling after surgery. Do not use any lotions or creams on the incision until instructed by your surgeon.      Information on my medicine - XARELTO (Rivaroxaban)  Why was Xarelto prescribed for you? Xarelto was prescribed  for you to reduce the risk of blood clots forming after orthopedic surgery. The medical term for these abnormal blood clots is venous thromboembolism (VTE).  What do you need to know about xarelto ? Take your Xarelto ONCE DAILY at the same time every day. You may take it either with or without food.  If you have difficulty swallowing the tablet whole, you may crush it and mix in applesauce just prior to taking your dose.  Take Xarelto exactly as prescribed by your doctor and DO NOT stop taking Xarelto without talking to the doctor who prescribed the medication.  Stopping without other VTE prevention medication to take the place of Xarelto may increase your risk of developing a clot.  After discharge, you should have regular check-up appointments with your healthcare provider that is prescribing your Xarelto.    What do you do if you miss a dose? If you miss a dose, take it as soon as you remember on the same day then continue your regularly scheduled once daily regimen the next day. Do not take two doses of Xarelto on the same day.   Important Safety Information A possible side effect of Xarelto is bleeding. You should call your healthcare provider right away if you experience any of the following: Bleeding from an injury or your nose that does not stop. Unusual colored urine (red or dark brown) or unusual colored stools (red or black). Unusual bruising for unknown reasons. A serious fall or if you hit your head (even if there is no bleeding).  Some medicines may interact with Xarelto and might increase your risk of bleeding while on Xarelto. To help avoid this, consult your healthcare provider or pharmacist prior to using any new prescription or non-prescription medications, including herbals, vitamins, non-steroidal anti-inflammatory drugs (NSAIDs) and supplements.  This website has more information on Xarelto: www.xarelto.com.   

## 2021-07-03 ENCOUNTER — Encounter (HOSPITAL_COMMUNITY): Payer: Self-pay | Admitting: Orthopedic Surgery

## 2021-07-03 DIAGNOSIS — M1611 Unilateral primary osteoarthritis, right hip: Secondary | ICD-10-CM | POA: Diagnosis not present

## 2021-07-03 LAB — CBC
HCT: 31.8 % — ABNORMAL LOW (ref 36.0–46.0)
Hemoglobin: 10.4 g/dL — ABNORMAL LOW (ref 12.0–15.0)
MCH: 30.9 pg (ref 26.0–34.0)
MCHC: 32.7 g/dL (ref 30.0–36.0)
MCV: 94.4 fL (ref 80.0–100.0)
Platelets: 384 10*3/uL (ref 150–400)
RBC: 3.37 MIL/uL — ABNORMAL LOW (ref 3.87–5.11)
RDW: 15.1 % (ref 11.5–15.5)
WBC: 11.8 10*3/uL — ABNORMAL HIGH (ref 4.0–10.5)
nRBC: 0 % (ref 0.0–0.2)

## 2021-07-03 LAB — BASIC METABOLIC PANEL
Anion gap: 11 (ref 5–15)
BUN: 18 mg/dL (ref 6–20)
CO2: 26 mmol/L (ref 22–32)
Calcium: 8.5 mg/dL — ABNORMAL LOW (ref 8.9–10.3)
Chloride: 102 mmol/L (ref 98–111)
Creatinine, Ser: 0.94 mg/dL (ref 0.44–1.00)
GFR, Estimated: >60 mL/min (ref >60)
Glucose, Bld: 147 mg/dL — ABNORMAL HIGH (ref 70–99)
Potassium: 3.6 mmol/L (ref 3.5–5.1)
Sodium: 139 mmol/L (ref 135–145)

## 2021-07-03 LAB — GLUCOSE, CAPILLARY: Glucose-Capillary: 109 mg/dL — ABNORMAL HIGH (ref 70–99)

## 2021-07-03 MED ORDER — OXYCODONE HCL 5 MG PO TABS: 5.0000 mg | ORAL_TABLET | Freq: Four times a day (QID) | ORAL | 0 refills | Status: DC | PRN

## 2021-07-03 MED ORDER — RIVAROXABAN 10 MG PO TABS: 10.0000 mg | ORAL_TABLET | Freq: Every day | ORAL | 0 refills | Status: AC

## 2021-07-03 MED ORDER — METHOCARBAMOL 500 MG PO TABS: 500.0000 mg | ORAL_TABLET | Freq: Four times a day (QID) | ORAL | 0 refills | Status: DC | PRN

## 2021-07-03 NOTE — TOC Transition Note (Signed)
Transition of Care [facility]) - CM/SW Discharge Note  Patient Details  Name: Emily Moon MRN: [id number] Date of Birth: [birth year]  [facility]) CM/SW Contact:  Sherie Don, LCSW Phone Number: 07/03/2021, 1:59 PM  Clinical Narrative: Patient is expected to discharge home after working with PT. CSW met with patient to review discharge plan and needs. Patient reported she will need HHPT and per notes, HH was to be set up with [facility] ([facility]). Patient has a rolling walker, rollator, and cane at home, but will need a 3N1. [employer] to deliver 3N1 to patient's room.  CSW followed up with Ronalee Belts from [place] regarding HHPT, but per Ronalee Belts the start of care date would be 7 days out. CSW made [facility] referral to [facility] with [facility]. CSW updated patient regarding HH. TOC signing off.  Final next level of care: [place] Barriers to Discharge: No Barriers Identified  Patient Goals and CMS Choice Patient states their goals for this hospitalization and ongoing recovery are:: Discharge home with [place] CMS Medicare.gov Compare Post Acute Care list provided to:: Patient Choice offered to / list presented to : Patient  Discharge Plan and Services         DME Arranged: N/A DME Agency: NA HH Arranged: PT [place] Agency: [place] ([place]) Date Lane: 07/03/21 Representative spoke with at [place]: Ramond Marrow  Readmission Risk Interventions No flowsheet data found.

## 2021-07-03 NOTE — Progress Notes (Signed)
Physical Therapy Treatment Patient Details Name: Emily Moon MRN: [id number] DOB: 13-Nov-1961 Today's Date: 07/03/2021    History of Present Illness Pt is 60 yo female s/p R anterior THA on 07/02/21.  She has history including but not limited to DM2, psoriasis, COPD, OSA, bipolar, vertigo, back pain, OA, HLD, chronic pain, MS, and GERD    PT Comments    Pt tolerated increased ambulation distance of 13' with RW, distance limited by R hip and knee pain and by pt feeling overheated (she stated this is related to [place]). Instructed pt in THA HEP, she demonstrates good understanding. Will see for a second session this afternoon.     Follow Up Recommendations  Follow surgeon's recommendation for DC plan and follow-up therapies;Supervision for mobility/OOB;Home health PT     Equipment Recommendations  3in1 (PT)    Recommendations for Other Services OT consult     Precautions / Restrictions Precautions Precautions: Fall Restrictions Weight Bearing Restrictions: No RLE Weight Bearing: Weight bearing as tolerated    Mobility  Bed Mobility               General bed mobility comments: up in recliner    Transfers Overall transfer level: Needs assistance Equipment used: Rolling walker (2 wheeled) Transfers: Sit to/from Stand Sit to Stand: Min assist         General transfer comment: min A to power up from commode (no 3 in 1 in room), VCs hand placement  Ambulation/Gait Ambulation/Gait assistance: Min guard Gait Distance (Feet): 45 Feet Assistive device: Rolling walker (2 wheeled) Gait Pattern/deviations: Step-to pattern;Decreased stride length Gait velocity: decreased   General Gait Details: distance limited by pain and feeling overheated (2* MS issues per pt), good sequencing, no loss of balance   Stairs             Wheelchair Mobility    Modified Rankin (Stroke Patients Only)       Balance Overall balance assessment: Needs  assistance Sitting-balance support: No upper extremity supported Sitting balance-Leahy Scale: Good     Standing balance support: No upper extremity supported;Bilateral upper extremity supported Standing balance-Leahy Scale: Fair Standing balance comment: RW to ambulate; no UE support static stand                            Cognition Arousal/Alertness: Awake/alert Behavior During Therapy: Anxious Overall Cognitive Status: Within Functional Limits for tasks assessed                                        Exercises Total Joint Exercises Ankle Circles/Pumps: AROM;Both;10 reps;Supine Quad Sets: AROM;Right;5 reps;Supine Short Arc Quad: AROM;Right;Supine;10 reps Heel Slides: AAROM;Right;10 reps;Supine Hip ABduction/ADduction: AAROM;Right;10 reps;Supine    General Comments        Pertinent Vitals/Pain Pain Score: 9  Pain Location: R hip and knee Pain Descriptors / Indicators: Discomfort;Sore Pain Intervention(s): Limited activity within patient's tolerance;Monitored during session;Premedicated before session;Ice applied    Home Living                      Prior Function            PT Goals (current goals can now be found in the care plan section) Acute Rehab PT Goals Patient Stated Goal: return home, have more stamina PT Goal Formulation: With patient Time For Goal Achievement: 07/16/21  Potential to Achieve Goals: Good Progress towards PT goals: Progressing toward goals    Frequency    7X/week      PT Plan Current plan remains appropriate    Co-evaluation              AM-PAC PT "6 Clicks" Mobility   Outcome Measure  Help needed turning from your back to your side while in a flat bed without using bedrails?: A Little Help needed moving from lying on your back to sitting on the side of a flat bed without using bedrails?: A Little Help needed moving to and from a bed to a chair (including a wheelchair)?: A Little Help  needed standing up from a chair using your arms (e.g., wheelchair or bedside chair)?: A Little Help needed to walk in hospital room?: A Little Help needed climbing 3-5 steps with a railing? : A Little 6 Click Score: 18    End of Session Equipment Utilized During Treatment: Gait belt Activity Tolerance: Patient tolerated treatment well;Other (comment) (some limitations due to feeling overheated) Patient left: in chair;with chair alarm set;with call bell/phone within reach Nurse Communication: Mobility status PT Visit Diagnosis: Other abnormalities of gait and mobility (R26.89);Muscle weakness (generalized) (M62.81)     Time: [id number] PT Time Calculation (min) (ACUTE ONLY): 25 min  Charges:  $Gait Training: 8-22 mins $Therapeutic Exercise: 8-22 mins                     Blondell Reveal Kistler PT 07/03/2021  Acute Rehabilitation Services Pager [phone] Office [phone]

## 2021-07-03 NOTE — Evaluation (Signed)
Occupational Therapy Evaluation Patient Details Name: Emily Moon MRN: [id number] DOB: 11-Aug-1961 Today's Date: 07/03/2021    History of Present Illness Pt is 60 yo female s/p R anterior THA on 07/02/21.  She has history including but not limited to DM2, psoriasis, COPD, OSA, bipolar, vertigo, back pain, OA, HLD, chronic pain, MS, and GERD   Clinical Impression   Emily Moon is a 60 year old woman s/p hip surgery who presents with decreased ROM and strength of right lower extremity. Overall patient min guard for ambulation and mod assist for LB dressing. Patient has AE at home and familiar with how to use it from prior surgery. Also has all needed DME with addition of 3 in 1. Patient verbalizes understanding of all compensatory strategies. No further OT needs.    Follow Up Recommendations  No OT follow up    Equipment Recommendations  None recommended by OT    Recommendations for Other Services       Precautions / Restrictions Precautions Precautions: Fall Restrictions Weight Bearing Restrictions: No RLE Weight Bearing: Weight bearing as tolerated      Mobility Bed Mobility Overal bed mobility: Modified Independent             General bed mobility comments: use of peseudo leg lifter to transfer in and out of bed.    Transfers Overall transfer level: Needs assistance Equipment used: Rolling walker (2 wheeled) Transfers: Sit to/from Stand Sit to Stand: Min guard         General transfer comment: Min guard to ambulate with RW. No physical assistance. Verbal cue to keep walker closer.    Balance Overall balance assessment: Mild deficits observed, not formally tested Sitting-balance support: No upper extremity supported Sitting balance-Leahy Scale: Good     Standing balance support: No upper extremity supported;Bilateral upper extremity supported Standing balance-Leahy Scale: Fair Standing balance comment: RW to ambulate; no UE support static  stand                           ADL either performed or assessed with clinical judgement   ADL Overall ADL's : Needs assistance/impaired Eating/Feeding: Independent   Grooming: Set up   Upper Body Bathing: Set up   Lower Body Bathing: Minimal assistance;Sit to/from stand   Upper Body Dressing : Set up   Lower Body Dressing: Moderate assistance Lower Body Dressing Details (indicate cue type and reason): for footwear and donning clothing over feet. Has AE at home including reacher, sock aide, shoe horn. Toilet Transfer: Production manager and Hygiene: Min guard;Sit to/from stand               Vision Patient Visual Report: No change from baseline       Perception     Praxis      Pertinent Vitals/Pain Pain Assessment: Faces Pain Score: 9  Faces Pain Scale: Hurts little more Pain Location: R hip and knee Pain Descriptors / Indicators: Discomfort;Sore Pain Intervention(s): Limited activity within patient's tolerance;Monitored during session     Hand Dominance     Extremity/Trunk Assessment Upper Extremity Assessment Upper Extremity Assessment: Generalized weakness (WFL ROM, grossly 4/5 strength bilaterally)   Lower Extremity Assessment Lower Extremity Assessment: Defer to PT evaluation   Cervical / Trunk Assessment Cervical / Trunk Assessment: Normal   Communication Communication Communication: No difficulties   Cognition Arousal/Alertness: Awake/alert Behavior During Therapy: WFL for tasks assessed/performed Overall Cognitive  Status: Within Functional Limits for tasks assessed                                     General Comments       Exercises Total Joint Exercises Ankle Circles/Pumps: AROM;Both;10 reps;Supine Quad Sets: AROM;Right;5 reps;Supine Short Arc Quad: AROM;Right;Supine;10 reps Heel Slides: AAROM;Right;10 reps;Supine Hip ABduction/ADduction:  AAROM;Right;10 reps;Supine   Shoulder Instructions      Home Living Family/patient expects to be discharged to:: Private residence Living Arrangements: Alone Available Help at Discharge: Family;Available 24 hours/day (sister is going to stay with her) Type of Home: Apartment Home Access: Level entry     Home Layout: One level     Bathroom Shower/Tub: Astronomer Accessibility: Yes   Home Equipment: Grab bars - tub/shower;Shower seat;Walker - 2 wheels;Walker - 4 wheels;Cane - single point;Cane - quad          Prior Functioning/Environment Level of Independence: Needs assistance  Gait / Transfers Assistance Needed: Before hip started bothering her she could ambulate in community without AD, but has even had difficulty with household ambulation since hip pain began ADL's / Homemaking Assistance Needed: Pt was having help with lower body dressing due to hip; had help with cooking and cleaning due to hip, MS, spinal stenosis   Comments: Had difficulty with standing extended periods of time        OT Problem List: Decreased strength;Decreased range of motion;Decreased activity tolerance;Impaired balance (sitting and/or standing);Obesity      OT Treatment/Interventions:      OT Goals(Current goals can be found in the care plan section) Acute Rehab OT Goals Patient Stated Goal: return home, have more stamina OT Goal Formulation: All assessment and education complete, DC therapy  OT Frequency:     Barriers to D/C:            Co-evaluation              AM-PAC OT "6 Clicks" Daily Activity     Outcome Measure Help from another person eating meals?: None Help from another person taking care of personal grooming?: A Little Help from another person toileting, which includes using toliet, bedpan, or urinal?: A Little Help from another person bathing (including washing, rinsing, drying)?: A Little Help from another person to put on and taking off regular  upper body clothing?: A Little Help from another person to put on and taking off regular lower body clothing?: A Lot 6 Click Score: 18   End of Session Equipment Utilized During Treatment: Rolling walker  Activity Tolerance: Patient tolerated treatment well Patient left: in bed;with call bell/phone within reach;with family/visitor present  OT Visit Diagnosis: Other abnormalities of gait and mobility (R26.89)                Time: [id number] OT Time Calculation (min): 20 min Charges:  OT General Charges $OT Visit: 1 Visit OT Evaluation $OT Eval Low Complexity: 1 Low  Anthonymichael Munday, OTR/L [place]  Office [phone] Pager: [phone]   Lenward Chancellor 07/03/2021, 1:04 PM

## 2021-07-03 NOTE — Progress Notes (Signed)
Physical Therapy Treatment Patient Details Name: Emily Moon MRN: [id number] DOB: 1961-09-03 Today's Date: 07/03/2021    History of Present Illness Pt is 60 yo female s/p R anterior THA on 07/02/21.  She has history including but not limited to DM2, psoriasis, COPD, OSA, bipolar, vertigo, back pain, OA, HLD, chronic pain, MS, and GERD    PT Comments    Pt tolerated increased ambulation distance of 125' with RW, with no loss of balance. Reviewed HEP, she demonstrates good understanding. She is ready to DC home from a PT standpoint.     Follow Up Recommendations  Follow surgeon's recommendation for DC plan and follow-up therapies;Supervision for mobility/OOB;Home health PT     Equipment Recommendations  3in1 (PT)    Recommendations for Other Services OT consult     Precautions / Restrictions Precautions Precautions: Fall Restrictions Weight Bearing Restrictions: No RLE Weight Bearing: Weight bearing as tolerated    Mobility  Bed Mobility Overal bed mobility: Modified Independent             General bed mobility comments: use of gait belt as  leg lifter to transfer in and out of bed.    Transfers Overall transfer level: Modified independent Equipment used: Rolling walker (2 wheeled) Transfers: Sit to/from Stand Sit to Stand: Modified independent (Device/Increase time)         General transfer comment: steady, no loss of balance  Ambulation/Gait Ambulation/Gait assistance: Supervision Gait Distance (Feet): 125 Feet Assistive device: Rolling walker (2 wheeled) Gait Pattern/deviations: Step-to pattern;Decreased stride length Gait velocity: decreased   General Gait Details: steady with RW, no loss of balance, good sequencing   Stairs             Wheelchair Mobility    Modified Rankin (Stroke Patients Only)       Balance Overall balance assessment: Needs assistance Sitting-balance support: No upper extremity supported Sitting balance-Leahy  Scale: Good     Standing balance support: No upper extremity supported;Bilateral upper extremity supported Standing balance-Leahy Scale: Fair Standing balance comment: RW to ambulate; no UE support static stand                            Cognition Arousal/Alertness: Awake/alert Behavior During Therapy: WFL for tasks assessed/performed Overall Cognitive Status: Within Functional Limits for tasks assessed                                        Exercises Total Joint Exercises Ankle Circles/Pumps: AROM;Both;10 reps;Supine Quad Sets: AROM;Right;5 reps;Supine Short Arc Quad: AROM;Right;Supine;10 reps Heel Slides: AAROM;Right;10 reps;Supine Hip ABduction/ADduction: AAROM;Right;10 reps;Supine    General Comments        Pertinent Vitals/Pain Pain Assessment: Faces Pain Score: 5  Faces Pain Scale: Hurts little more Pain Location: R hip Pain Descriptors / Indicators: Sore Pain Intervention(s): Limited activity within patient's tolerance;Monitored during session;Premedicated before session;Ice applied    Home Living Family/patient expects to be discharged to:: Private residence Living Arrangements: Alone Available Help at Discharge: Family;Available 24 hours/day (sister is going to stay with her) Type of Home: Apartment Home Access: Level entry   Home Layout: One level Home Equipment: Grab bars - tub/shower;Shower seat;Walker - 2 wheels;Walker - 4 wheels;Cane - single point;Cane - quad      Prior Function Level of Independence: Needs assistance  Gait / Transfers Assistance Needed: Before hip started bothering  her she could ambulate in community without AD, but has even had difficulty with household ambulation since hip pain began ADL's / Homemaking Assistance Needed: Pt was having help with lower body dressing due to hip; had help with cooking and cleaning due to hip, MS, spinal stenosis Comments: Had difficulty with standing extended periods of time    PT Goals (current goals can now be found in the care plan section) Acute Rehab PT Goals Patient Stated Goal: return home, have more stamina PT Goal Formulation: With patient Time For Goal Achievement: 07/16/21 Potential to Achieve Goals: Good Progress towards PT goals: Goals met/education completed, patient discharged from PT    Frequency    7X/week      PT Plan Current plan remains appropriate    Co-evaluation              AM-PAC PT "6 Clicks" Mobility   Outcome Measure  Help needed turning from your back to your side while in a flat bed without using bedrails?: A Little Help needed moving from lying on your back to sitting on the side of a flat bed without using bedrails?: A Little Help needed moving to and from a bed to a chair (including a wheelchair)?: None Help needed standing up from a chair using your arms (e.g., wheelchair or bedside chair)?: None Help needed to walk in hospital room?: None Help needed climbing 3-5 steps with a railing? : A Little 6 Click Score: 21    End of Session Equipment Utilized During Treatment: Gait belt Activity Tolerance: Patient tolerated treatment well Patient left: with call bell/phone within reach;in bed;with family/visitor present Nurse Communication: Mobility status PT Visit Diagnosis: Other abnormalities of gait and mobility (R26.89);Muscle weakness (generalized) (M62.81)     Time: [id number] PT Time Calculation (min) (ACUTE ONLY): 23 min  Charges:  $Gait Training: 8-22 mins $Therapeutic Exercise: 8-22 mins                     Blondell Reveal Kistler PT 07/03/2021  Acute Rehabilitation Services Pager [phone] Office [phone]

## 2021-07-03 NOTE — Plan of Care (Signed)
Plan of care reviewed and discussed with the patient. 

## 2021-07-03 NOTE — Care Plan (Signed)
Ortho Bundle Case Management Note  Patient Details  Name: Emily Moon MRN: [id number] Date of Birth: 01-01-1961  R THA on 07-02-21 DCP:  Home with sister.  1 story apt with 0 ste. DME:  No needs.  Has a RW and 3-in-1 PT:  HHC per PA.  Prefer [facility].                    DME Arranged:  N/A DME Agency:  NA  HH Arranged:  PT HH Agency:  [facility] (now [facility])  Additional Comments: Please contact me with any questions of if this plan should need to change.  Marianne Sofia, RN,CCM [facility]  [phone] 07/03/2021, 8:08 AM

## 2021-07-03 NOTE — Progress Notes (Signed)
Subjective: 1 Day Post-Op Procedure(s) (LRB): TOTAL HIP ARTHROPLASTY ANTERIOR APPROACH (Right) Patient reports pain as mild.   Patient seen in rounds with Dr. Wynelle Link. Patient is well, and has had no acute complaints or problems other than soreness in the right hip. Reports pain responds well to the oxycodone. No issues overnight. Denies chest pain or Sob.  We will continue therapy today.   Objective: Vital signs in last 24 hours: Temp:  [97.7 F (36.5 C)-100 F (37.8 C)] 100 F (37.8 C) (08/18 0616) Pulse Rate:  [84-110] 110 (08/18 0616) Resp:  [10-20] 18 (08/18 0616) BP: (97-148)/(58-86) 119/86 (08/18 0616) SpO2:  [93 %-100 %] 95 % (08/18 0735) Weight:  [108.4 kg] 108.4 kg (08/17 1002)  Intake/Output from previous day:  Intake/Output Summary (Last 24 hours) at 07/03/2021 0742 Last data filed at 07/03/2021 0200 Gross per 24 hour  Intake 2210 ml  Output 700 ml  Net 1510 ml     Intake/Output this shift: No intake/output data recorded.  Labs: Recent Labs    07/03/21 0329  HGB 10.4*   Recent Labs    07/03/21 0329  WBC 11.8*  RBC 3.37*  HCT 31.8*  PLT 384   Recent Labs    07/03/21 0329  NA 139  K 3.6  CL 102  CO2 26  BUN 18  CREATININE 0.94  GLUCOSE 147*  CALCIUM 8.5*   No results for input(s): LABPT, INR in the last 72 hours.  Exam: General - Patient is Alert and Oriented Extremity - Neurologically intact Neurovascular intact Sensation intact distally Dorsiflexion/Plantar flexion intact Dressing - dressing C/D/I Motor Function - intact, moving foot and toes well on exam.   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   History of kidney stones    Hypercholesterolemia    Multiple sclerosis (HCC)    weakness   Osteoporosis    osteoarthritis   Pneumonia    PONV (postoperative nausea and vomiting)    no problem after cataract surgery   Prediabetes 07/22/2018   Psoriasis    Shortness of breath dyspnea    wheezing   Sleep apnea 2012   sleep study / slight, no interventions   Urinary frequency    Weight gain 06/21/2014    Assessment/Plan: 1 Day Post-Op Procedure(s) (LRB): TOTAL HIP ARTHROPLASTY ANTERIOR APPROACH (Right) Principal Problem:   OA (osteoarthritis) of hip Active Problems:   Primary osteoarthritis of right hip  Estimated body mass index is 35.29 kg/m as calculated from the following:   Height as of this encounter: '5\' 9"'$  (1.753 m).   Weight as of this encounter: 108.4 kg. Advance diet Up with therapy D/C IV fluids  DVT Prophylaxis - Xarelto Weight bearing as tolerated. Continue therapy.  Plan is to go Home after hospital stay. Possible discharge this afternoon if meeting goals with physical therapy. Due to patient's history of MS and balance issues,  will order HHPT.  Follow-up in the office in 2 weeks if discharges  The [place] was reviewed today prior to any opioid medications being prescribed to this patient.  Theresa Duty, PA-C Orthopedic Surgery [phone] 07/03/2021, 7:42 AM

## 2021-07-03 NOTE — Plan of Care (Signed)
Discharge instructions given to the patient. °

## 2021-07-04 NOTE — Telephone Encounter (Signed)
Surgical risk assessment has been faxed to [facility] at Dr. Anne Fu office.  Nothing further needed at this time.

## 2021-07-05 ENCOUNTER — Other Ambulatory Visit: Payer: Self-pay | Admitting: Primary Care

## 2021-07-05 DIAGNOSIS — Z7901 Long term (current) use of anticoagulants: Secondary | ICD-10-CM | POA: Diagnosis not present

## 2021-07-05 DIAGNOSIS — Z79891 Long term (current) use of opiate analgesic: Secondary | ICD-10-CM | POA: Diagnosis not present

## 2021-07-05 DIAGNOSIS — Z79899 Other long term (current) drug therapy: Secondary | ICD-10-CM | POA: Diagnosis not present

## 2021-07-05 DIAGNOSIS — Z471 Aftercare following joint replacement surgery: Secondary | ICD-10-CM | POA: Diagnosis not present

## 2021-07-05 DIAGNOSIS — J449 Chronic obstructive pulmonary disease, unspecified: Secondary | ICD-10-CM | POA: Diagnosis not present

## 2021-07-05 DIAGNOSIS — G35 Multiple sclerosis: Secondary | ICD-10-CM | POA: Diagnosis not present

## 2021-07-05 DIAGNOSIS — E7800 Pure hypercholesterolemia, unspecified: Secondary | ICD-10-CM | POA: Diagnosis not present

## 2021-07-05 DIAGNOSIS — F319 Bipolar disorder, unspecified: Secondary | ICD-10-CM | POA: Diagnosis not present

## 2021-07-05 DIAGNOSIS — K219 Gastro-esophageal reflux disease without esophagitis: Secondary | ICD-10-CM | POA: Diagnosis not present

## 2021-07-05 DIAGNOSIS — Z9181 History of falling: Secondary | ICD-10-CM | POA: Diagnosis not present

## 2021-07-05 DIAGNOSIS — M109 Gout, unspecified: Secondary | ICD-10-CM | POA: Diagnosis not present

## 2021-07-05 DIAGNOSIS — E119 Type 2 diabetes mellitus without complications: Secondary | ICD-10-CM | POA: Diagnosis not present

## 2021-07-05 DIAGNOSIS — I10 Essential (primary) hypertension: Secondary | ICD-10-CM | POA: Diagnosis not present

## 2021-07-05 DIAGNOSIS — G4730 Sleep apnea, unspecified: Secondary | ICD-10-CM | POA: Diagnosis not present

## 2021-07-05 DIAGNOSIS — M810 Age-related osteoporosis without current pathological fracture: Secondary | ICD-10-CM | POA: Diagnosis not present

## 2021-07-05 DIAGNOSIS — M5030 Other cervical disc degeneration, unspecified cervical region: Secondary | ICD-10-CM | POA: Diagnosis not present

## 2021-07-05 DIAGNOSIS — K1379 Other lesions of oral mucosa: Secondary | ICD-10-CM

## 2021-07-05 DIAGNOSIS — Z96641 Presence of right artificial hip joint: Secondary | ICD-10-CM | POA: Diagnosis not present

## 2021-07-07 ENCOUNTER — Telehealth: Payer: Self-pay | Admitting: Primary Care

## 2021-07-07 NOTE — Discharge Summary (Signed)
Physician Discharge Summary   Patient ID: Emily Moon MRN: [id number] DOB/AGE: [birth year] 60 y.o.  Admit date: 07/02/2021 Discharge date: 07/03/2021  Primary Diagnosis: Osteoarthritis, right hip   Admission Diagnoses:  Past Medical History:  Diagnosis Date   Arthritis    osteo   Asthma    Bipolar disorder ([place]) 05/21/2014   Cataracts, bilateral    COPD (chronic obstructive pulmonary disease) (HCC)    DDD (degenerative disc disease), cervical    also back   Depression    Diabetes mellitus without complication (HCC)    Dizziness    Positional   Edema    feet/legs   Fibromyalgia syndrome    Fungal infection    Finger nails   GERD (gastroesophageal reflux disease)    Gout    Heart palpitations    Hip dysplasia, 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   History of kidney stones    Hypercholesterolemia    Multiple sclerosis (HCC)    weakness   Osteoporosis    osteoarthritis   Pneumonia    PONV (postoperative nausea and vomiting)    no problem after cataract surgery   Prediabetes 07/22/2018   Psoriasis    Shortness of breath dyspnea    wheezing   Sleep apnea 2012   sleep study / slight, no interventions   Urinary frequency    Weight gain 06/21/2014   Discharge Diagnoses:   Principal Problem:   OA (osteoarthritis) of hip Active Problems:   Primary osteoarthritis of right hip  Estimated body mass index is 35.29 kg/m as calculated from the following:   Height as of this encounter: '5\' 9"'$  (1.753 m).   Weight as of this encounter: 108.4 kg.  Procedure:  Procedure(s) (LRB): TOTAL HIP ARTHROPLASTY ANTERIOR APPROACH (Right)   Consults: None  HPI: Emily Moon is a 60 y.o. female who has advanced end-stage arthritis of their Right  hip with progressively worsening pain and dysfunction.The patient has failed nonoperative management and presents for total hip arthroplasty.   Laboratory Data: Admission on 07/02/2021, Discharged on 07/03/2021  Component Date Value Ref Range  Status   ABO/RH(D) 07/02/2021    Final                   Value:O POS Performed at [facility] [address], [place], [place] [address]    Hgb A1c MFr Bld 07/02/2021 6.6 (A) 4.8 - 5.6 % Final   Comment: (NOTE) Pre diabetes:          5.7%-6.4%  Diabetes:              >6.4%  Glycemic control for   <7.0% adults with diabetes    Mean Plasma Glucose 07/02/2021 142.72  mg/dL Final   Performed at [facility], [place] [address] [place], [place] [address]   Glucose-Capillary 07/02/2021 117 (A) 70 - 99 mg/dL Final   Glucose reference range applies only to samples taken after fasting for at least 8 hours.   Glucose-Capillary 07/02/2021 178 (A) 70 - 99 mg/dL Final   Glucose reference range applies only to samples taken after fasting for at least 8 hours.   WBC 07/03/2021 11.8 (A) 4.0 - 10.5 K/uL Final   RBC 07/03/2021 3.37 (A) 3.87 - 5.11 MIL/uL Final   Hemoglobin 07/03/2021 10.4 (A) 12.0 - 15.0 g/dL Final   HCT 07/03/2021 31.8 (A) 36.0 - 46.0 % Final   MCV 07/03/2021 94.4  80.0 - 100.0 fL Final   MCH 07/03/2021 30.9  26.0 -  34.0 pg Final   MCHC 07/03/2021 32.7  30.0 - 36.0 g/dL Final   RDW 07/03/2021 15.1  11.5 - 15.5 % Final   Platelets 07/03/2021 384  150 - 400 K/uL Final   nRBC 07/03/2021 0.0  0.0 - 0.2 % Final   Performed at [facility], [place] [address], [place], [place] [address]   Sodium 07/03/2021 139  135 - 145 mmol/L Final   Potassium 07/03/2021 3.6  3.5 - 5.1 mmol/L Final   Chloride 07/03/2021 102  98 - 111 mmol/L Final   CO2 07/03/2021 26  22 - 32 mmol/L Final   Glucose, Bld 07/03/2021 147 (A) 70 - 99 mg/dL Final   Glucose reference range applies only to samples taken after fasting for at least 8 hours.   BUN 07/03/2021 18  6 - 20 mg/dL Final   Creatinine, Ser 07/03/2021 0.94  0.44 - 1.00 mg/dL Final   Calcium 07/03/2021 8.5 (A) 8.9 - 10.3 mg/dL Final   GFR, Estimated 07/03/2021 >60  >60 mL/min Final   Comment: (NOTE) Calculated  using the CKD-EPI Creatinine Equation (2021)    Anion gap 07/03/2021 11  5 - 15 Final   Performed at [facility], [place] [address], [place], [place] [address]   Glucose-Capillary 07/03/2021 109 (A) 70 - 99 mg/dL Final   Glucose reference range applies only to samples taken after fasting for at least 8 hours.  Hospital Outpatient Visit on 06/30/2021  Component Date Value Ref Range Status   SARS Coronavirus 2 06/30/2021 NEGATIVE  NEGATIVE Final   Comment: (NOTE) SARS-CoV-2 target nucleic acids are NOT DETECTED.  The SARS-CoV-2 RNA is generally detectable in upper and lower respiratory specimens during the acute phase of infection. Negative results do not preclude SARS-CoV-2 infection, do not rule out co-infections with other pathogens, and should not be used as the sole basis for treatment or other patient management decisions. Negative results must be combined with clinical observations, patient history, and epidemiological information. The expected result is Negative.  Fact Sheet for Patients: SugarRoll.be  Fact Sheet for Healthcare Providers: https://www.woods-mathews.com/  This test is not yet approved or cleared by the [place] FDA and  has been authorized for detection and/or diagnosis of SARS-CoV-2 by FDA under an Emergency Use Authorization (EUA). This EUA will remain  in effect (meaning this test can be used) for the duration of the COVID-19 declaration under Se                          ction 564(b)(1) of the Act, 21 U.S.C. section 360bbb-3(b)(1), unless the authorization is terminated or revoked sooner.  Performed at [facility], [place] [address] [place],  [address]   Hospital Outpatient Visit on 06/23/2021  Component Date Value Ref Range Status   Glucose-Capillary 06/23/2021 109 (A) 70 - 99 mg/dL Final   Glucose reference range applies only to samples taken after fasting for at least 8  hours.   MRSA, PCR 06/23/2021 NEGATIVE  NEGATIVE Final   Staphylococcus aureus 06/23/2021 NEGATIVE  NEGATIVE Final   Comment: (NOTE) The Xpert SA Assay (FDA approved for NASAL specimens in patients 60 years of age and older), is one component of a comprehensive surveillance program. It is not intended to diagnose infection nor to guide or monitor treatment. Performed at [facility], [place] [address], [place], [place] [address]    WBC 06/23/2021 9.3  4.0 - 10.5 K/uL Final  RBC 06/23/2021 4.32  3.87 - 5.11 MIL/uL Final   Hemoglobin 06/23/2021 13.2  12.0 - 15.0 g/dL Final   HCT 06/23/2021 40.1  36.0 - 46.0 % Final   MCV 06/23/2021 92.8  80.0 - 100.0 fL Final   MCH 06/23/2021 30.6  26.0 - 34.0 pg Final   MCHC 06/23/2021 32.9  30.0 - 36.0 g/dL Final   RDW 06/23/2021 14.9  11.5 - 15.5 % Final   Platelets 06/23/2021 492 (A) 150 - 400 K/uL Final   nRBC 06/23/2021 0.0  0.0 - 0.2 % Final   Performed at [facility], [place] [address], [place], [place] [address]   Sodium 06/23/2021 141  135 - 145 mmol/L Final   Potassium 06/23/2021 4.4  3.5 - 5.1 mmol/L Final   Chloride 06/23/2021 106  98 - 111 mmol/L Final   CO2 06/23/2021 24  22 - 32 mmol/L Final   Glucose, Bld 06/23/2021 102 (A) 70 - 99 mg/dL Final   Glucose reference range applies only to samples taken after fasting for at least 8 hours.   BUN 06/23/2021 25 (A) 6 - 20 mg/dL Final   Creatinine, Ser 06/23/2021 0.93  0.44 - 1.00 mg/dL Final   Calcium 06/23/2021 9.4  8.9 - 10.3 mg/dL Final   Total Protein 06/23/2021 7.7  6.5 - 8.1 g/dL Final   Albumin 06/23/2021 4.8  3.5 - 5.0 g/dL Final   AST 06/23/2021 24  15 - 41 U/L Final   ALT 06/23/2021 44  0 - 44 U/L Final   Alkaline Phosphatase 06/23/2021 44  38 - 126 U/L Final   Total Bilirubin 06/23/2021 0.5  0.3 - 1.2 mg/dL Final   GFR, Estimated 06/23/2021 >60  >60 mL/min Final   Comment: (NOTE) Calculated using the CKD-EPI Creatinine Equation (2021)     Anion gap 06/23/2021 11  5 - 15 Final   Performed at [facility], [place] [address] [place], [place] [address]   Prothrombin Time 06/23/2021 12.7  11.4 - 15.2 seconds Final   INR 06/23/2021 1.0  0.8 - 1.2 Final   Comment: (NOTE) INR goal varies based on device and disease states. Performed at [facility], [place] [address], [place], [place] [address]    ABO/RH(D) 06/23/2021 O POS   Final   Antibody Screen 06/23/2021 NEG   Final   Sample Expiration 06/23/2021 07/05/2021,2359   Final   Extend sample reason 06/23/2021    Final                   Value:NO TRANSFUSIONS OR PREGNANCY IN THE PAST 3 MONTHS Performed at [facility] [address] [place], [place] [address]   Appointment on 06/05/2021  Component Date Value Ref Range Status   AR max vel 06/05/2021 2.87  cm2 Final   AV Peak grad 06/05/2021 6.7  mmHg Final   Ao pk vel 06/05/2021 1.29  m/s Final   S' Lateral 06/05/2021 2.50  cm Final   Area-P 1/2 06/05/2021 3.56  cm2 Final   AV Area VTI 06/05/2021 2.93  cm2 Final   AV Mean grad 06/05/2021 4.0  mmHg Final   Single Plane A4C EF 06/05/2021 57.0  % Final   Single Plane A2C EF 06/05/2021 51.1  % Final   Calc EF 06/05/2021 54.3  % Final   AV Area mean vel 06/05/2021 2.66  cm2 Final  Office Visit on 06/04/2021  Component Date Value Ref Range Status   High  risk HPV 06/04/2021 Negative   Final   Adequacy 06/04/2021 Satisfactory for evaluation; transformation zone component PRESENT.   Final   Diagnosis 06/04/2021 - Negative for intraepithelial lesion or malignancy (NILM)   Final   Comment 06/04/2021 Normal Reference Range HPV - Negative   Final   Hemoglobin A1C 06/04/2021 6.3 (A) 4.0 - 5.6 % Final  Abstract on 05/20/2021  Component Date Value Ref Range Status   HM Diabetic Eye Exam 05/14/2021 No Retinopathy  No Retinopathy Final     X-Rays:DG Chest 2 View  Result Date: 06/30/2021 CLINICAL DATA:  COPD with asthma. Pre-op  pulmonary/respiratory exam. EXAM: CHEST - 2 VIEW COMPARISON:  Radiographs 02/19/2020.  CT 03/11/2020. FINDINGS: The heart size and mediastinal contours are normal. The lungs are clear. There is no pleural effusion or pneumothorax. No acute osseous findings are identified. IMPRESSION: Stable chest.  No active cardiopulmonary process. Electronically Signed   By: Richardean Sale M.D.   On: 06/30/2021 16:07   DG Pelvis Portable  Result Date: 07/02/2021 CLINICAL DATA:  Right hip replacement EXAM: PORTABLE PELVIS 1-2 VIEWS; OPERATIVE RIGHT HIP WITH PELVIS COMPARISON:  None. FINDINGS: C-arm images show right hip replacement. Components appear well positioned without radiographically detectable complication. IMPRESSION: Good postoperative appearance. Electronically Signed   By: Nelson Chimes M.D.   On: 07/02/2021 14:03   DG C-Arm 1-60 Min-No Report  Result Date: 07/02/2021 Fluoroscopy was utilized by the requesting physician.  No radiographic interpretation.   DG HIP OPERATIVE UNILAT WITH PELVIS RIGHT  Result Date: 07/02/2021 CLINICAL DATA:  Right hip replacement EXAM: PORTABLE PELVIS 1-2 VIEWS; OPERATIVE RIGHT HIP WITH PELVIS COMPARISON:  None. FINDINGS: C-arm images show right hip replacement. Components appear well positioned without radiographically detectable complication. IMPRESSION: Good postoperative appearance. Electronically Signed   By: Nelson Chimes M.D.   On: 07/02/2021 14:03    EKG: Orders placed or performed in visit on 06/26/21   EKG 12-Lead   *Note: Due to a large number of results and/or encounters for the requested time period, some results have not been displayed. A complete set of results can be found in Results Review.     Hospital Course: Emily Moon is a 60 y.o. who was admitted to [facility]. They were brought to the operating room on 07/02/2021 and underwent Procedure(s): [place].  Patient tolerated the procedure well and was later  transferred to the recovery room and then to the orthopaedic floor for postoperative care. They were given PO and IV analgesics for pain control following their surgery. They were given 24 hours of postoperative antibiotics of  Anti-infectives (From admission, onward)    Start     Dose/Rate Route Frequency Ordered Stop   07/02/21 1730  ceFAZolin (ANCEF) IVPB 2g/100 mL premix        2 g 200 mL/hr over 30 Minutes Intravenous Every 6 hours 07/02/21 1520 07/03/21 0028   07/02/21 0945  ceFAZolin (ANCEF) IVPB 2g/100 mL premix        2 g 200 mL/hr over 30 Minutes Intravenous On call to O.R. 07/02/21 [id number] 07/02/21 1155      and started on DVT prophylaxis in the form of Xarelto.   PT and OT were ordered for total joint protocol. Discharge planning consulted to help with postop disposition and equipment needs.  Patient had a good night on the evening of surgery. They started to get up OOB with therapy on POD #0. Pt was seen during rounds and was ready  to go home pending progress with therapy. She worked with therapy on POD #1 and was meeting her goals. Pt was discharged to home later that day in stable condition.  Diet: Diabetic diet Activity: WBAT Follow-up: in 2 weeks Disposition: Home with HHPT Discharged Condition: stable   Discharge Instructions     Call MD / Call 911   Complete by: As directed    If you experience chest pain or shortness of breath, CALL 911 and be transported to the hospital emergency room.  If you develope a fever above 101 F, pus (white drainage) or increased drainage or redness at the wound, or calf pain, call your surgeon's office.   Change dressing   Complete by: As directed    You have an adhesive waterproof bandage over the incision. Leave this in place until your first follow-up appointment. Once you remove this you will not need to place another bandage.   Constipation Prevention   Complete by: As directed    Drink plenty of fluids.  Prune juice may be helpful.   You may use a stool softener, such as Colace (over the counter) 100 mg twice a day.  Use MiraLax (over the counter) for constipation as needed.   Diet - low sodium heart healthy   Complete by: As directed    Do not sit on low chairs, stoools or toilet seats, as it may be difficult to get up from low surfaces   Complete by: As directed    Driving restrictions   Complete by: As directed    No driving for two weeks   Post-operative opioid taper instructions:   Complete by: As directed    POST-OPERATIVE OPIOID TAPER INSTRUCTIONS: It is important to wean off of your opioid medication as soon as possible. If you do not need pain medication after your surgery it is ok to stop day one. Opioids include: Codeine, Hydrocodone(Norco, Vicodin), Oxycodone(Percocet, oxycontin) and hydromorphone amongst others.  Long term and even short term use of opiods can cause: Increased pain response Dependence Constipation Depression Respiratory depression And more.  Withdrawal symptoms can include Flu like symptoms Nausea, vomiting And more Techniques to manage these symptoms Hydrate well Eat regular healthy meals Stay active Use relaxation techniques(deep breathing, meditating, yoga) Do Not substitute Alcohol to help with tapering If you have been on opioids for less than two weeks and do not have pain than it is ok to stop all together.  Plan to wean off of opioids This plan should start within one week post op of your joint replacement. Maintain the same interval or time between taking each dose and first decrease the dose.  Cut the total daily intake of opioids by one tablet each day Next start to increase the time between doses. The last dose that should be eliminated is the evening dose.      TED hose   Complete by: As directed    Use stockings (TED hose) for three weeks on both leg(s).  You may remove them at night for sleeping.   Weight bearing as tolerated   Complete by: As directed        Allergies as of 07/03/2021       Reactions   Albuterol Shortness Of Breath, Other (See Comments)   Makes pt feel jittery/ tacycardic   Crestor [rosuvastatin] Other (See Comments)   Joint pain, muscle pain, and hair loss   Halcion [triazolam] Other (See Comments)   Dizziness,headaches,bladder problems   Levaquin [levofloxacin In D5w]  Diarrhea, Itching   Shoulder pain   Naproxen Sodium Swelling   Patient tolerates in small doses   Tylenol [acetaminophen] Swelling   Patient tolerates in small doses   Cefaclor Other (See Comments)   Doesn't remember---unsure if actually allergic    Sulfa Antibiotics Itching   Unsure of reaction possibly itching   Tramadol Itching, Nausea And Vomiting   Zinc Other (See Comments)   constipation  constipation    Aripiprazole Other (See Comments)   Muscle tension/cramping   Diclofenac Sodium Rash   "made very sick"   Ibuprofen Swelling   Patient tolerates in small doses        Medication List     STOP taking these medications    aspirin EC 81 MG tablet   BIOTIN PO   Lysine 1000 MG Tabs   Magnesium 500 MG Tabs   Vitamin D3 250 MCG (10000 UT) capsule       TAKE these medications    Azelastine HCl 137 MCG/SPRAY Soln PLACE 2 SPRAYS INTO BOTH NOSTRILS 2 (TWO) TIMES DAILY What changed: See the new instructions.   budesonide-formoterol 80-4.5 MCG/ACT inhaler Commonly known as: Symbicort Inhale 2 puffs into the lungs 2 (two) times daily.   calcipotriene-betamethasone ointment Commonly known as: TACLONEX Apply topically daily. What changed: how much to take   cetirizine 10 MG tablet Commonly known as: ZYRTEC Take 10 mg by mouth 2 (two) times daily.   ciclopirox 0.77 % cream Commonly known as: LOPROX Apply topically 2 (two) times daily.   clindamycin 1 % external solution Commonly known as: CLEOCIN T Apply 1-2 times daily to scalp as needed for itch.   clobetasol 0.05 % external solution Commonly known as: TEMOVATE Mix  clobetasol solution with  CeraVe cream Use twice daily to affected areas.Avoid Face, groin and underarm   clobetasol 0.05 % topical foam Commonly known as: OLUX Apply to affected areas at arms, legs, trunk twice a day up to 2 weeks. Avoid applying to face, groin, and axilla. Use as directed. Risk of skin atrophy with long-term use reviewed.   cyanocobalamin 1000 MCG/ML injection Commonly known as: (VITAMIN B-12) Inject 1 mL (1,000 mcg total) into the muscle every 30 (thirty) days. For B12 vitamin   desipramine 25 MG tablet Commonly known as: NORPRAMIN TAKE 1 TABLET BY MOUTH IN THE MORNING What changed:  how much to take how to take this when to take this additional instructions   diclofenac sodium 1 % Gel Commonly known as: Voltaren Apply 4 g topically 4 (four) times daily. What changed:  when to take this reasons to take this   DULoxetine 20 MG capsule Commonly known as: CYMBALTA Take 2 capsules (40 mg total) by mouth daily.   famotidine 20 MG tablet Commonly known as: PEPCID Take 1 tablet (20 mg total) by mouth 2 (two) times daily.   fenofibrate 145 MG tablet Commonly known as: Tricor Take 1 tablet (145 mg total) by mouth daily.   fluocinonide 0.05 % external solution Commonly known as: LIDEX Apply 1 application topically 2 (two) times daily as needed. Apply to scalp twice daily as need for itch What changed:  reasons to take this additional instructions   FreeStyle Libre 14 Day Sensor Misc PLACE INTO SKIN FOR CONTINUOUS CHECK OF BLOOD SUGAR DAILY   hydrocortisone 2.5 % cream Apply 1 application topically 2 (two) times daily as needed (underarm/groin rash).   hydroquinone 4 % cream Apply 1 application topically daily as needed (blemishes).   hydrOXYzine  50 MG capsule Commonly known as: VISTARIL TAKE 1 CAPSLE BY MOUTH AT NOON, 1 AT 6 PM, AND 1 AS NEEDED What changed:  how much to take how to take this when to take this additional instructions    lamoTRIgine 150 MG tablet Commonly known as: LAMICTAL Take 2 tablets (300 mg total) by mouth at bedtime.   levalbuterol 45 MCG/ACT inhaler Commonly known as: XOPENEX HFA INHALE 1 TO 2 PUFFS BY MOUTH EVERY 6 HOURS AS NEEDED FOR WHEEZE What changed: See the new instructions.   meclizine 25 MG tablet Commonly known as: ANTIVERT Take 0.5-1 tablets (12.5-25 mg total) by mouth 3 (three) times daily as needed for dizziness (sedation caution).   methocarbamol 500 MG tablet Commonly known as: ROBAXIN Take 1 tablet (500 mg total) by mouth every 6 (six) hours as needed for muscle spasms. What changed:  how much to take when to take this   metoprolol succinate 50 MG 24 hr tablet Commonly known as: TOPROL-XL Take 1 tablet (50 mg total) by mouth daily. Take with or immediately following a meal.   neomycin-bacitracin-polymyxin [phone] ointment Apply 1 application topically 4 (four) times daily as needed (for cut/scrapes.).   oxyCODONE 5 MG immediate release tablet Commonly known as: Oxy IR/ROXICODONE Take 1-2 tablets (5-10 mg total) by mouth every 6 (six) hours as needed for moderate pain or severe pain.   pantoprazole 40 MG tablet Commonly known as: PROTONIX Take 1 tablet (40 mg total) by mouth daily. For heartburn   phenazopyridine 95 MG tablet Commonly known as: PYRIDIUM Take 95 mg by mouth 3 (three) times daily as needed for pain.   polyethylene glycol powder 17 GM/SCOOP powder Commonly known as: GLYCOLAX/MIRALAX MIX 17 GRAMS (1 CAPFUL) WITH 4-8 OZ OF LIQUID AND TAKE BY MOUTH TWICE DAILY AS NEEDED What changed:  how much to take how to take this when to take this reasons to take this additional instructions   polyvinyl alcohol 1.4 % ophthalmic solution Commonly known as: LIQUIFILM TEARS Place 2 drops into both eyes at bedtime.   Repatha SureClick [id number] MG/ML Soaj Generic drug: Evolocumab Inject 1 Dose into the skin every 14 (fourteen) days.   rivaroxaban 10 MG Tabs  tablet Commonly known as: XARELTO Take 1 tablet (10 mg total) by mouth daily with breakfast for 20 days. Then resume one 81 mg aspirin once a day.   SYRINGE/NEEDLE (DISP) 1 ML 23G X 1" 1 ML Misc Use as directed   temazepam 30 MG capsule Commonly known as: RESTORIL TAKE ONE TO TWO CAPSULES BY MOUTH NIGHTLY AT BEDTIME What changed:  how much to take when to take this additional instructions   traZODone 50 MG tablet Commonly known as: DESYREL Take 2 tablets (100 mg total) by mouth at bedtime.   Trulicity 1.5 0000000 Sopn Generic drug: Dulaglutide Inject 1.5 mg into the skin once a week. For diabetes.               Discharge Care Instructions  (From admission, onward)           Start     Ordered   07/03/21 0000  Weight bearing as tolerated        07/03/21 0747   07/03/21 0000  Change dressing       Comments: You have an adhesive waterproof bandage over the incision. Leave this in place until your first follow-up appointment. Once you remove this you will not need to place another bandage.   07/03/21 0747  Follow-up Information     Gaynelle Arabian, MD. Go on 07/15/2021.   Specialty: Orthopedic Surgery Why: You are scheduled for a follow up appointment on 07-15-21 at 1:15 pm. Contact information: [address] [place] [address] [phone]         [facility] Follow up.   Specialty: Home Health Services                Signed: Theresa Duty, PA-C Orthopedic Surgery 07/07/2021, 7:52 AM

## 2021-07-07 NOTE — Chronic Care Management (AMB) (Signed)
  Chronic Care Management   Note  07/07/2021 Name: Emily Moon MRN: [id number] DOB: 1961/01/18  Emily Moon is a 60 y.o. year old female who is a primary care patient of [name], NP. I reached out to [name] by phone today in response to a referral sent by Ms. Ruffin Pyo PCP, [name], NP.   Ms. Koenning was given information about Chronic Care Management services today including:  CCM service includes personalized support from designated clinical staff supervised by her physician, including individualized plan of care and coordination with other care providers 24/7 contact phone numbers for assistance for urgent and routine care needs. Service will only be billed when office clinical staff spend 20 minutes or more in a month to coordinate care. Only one practitioner may furnish and bill the service in a calendar month. The patient may stop CCM services at any time (effective at the end of the month) by phone call to the office staff.   Patient agreed to services and verbal consent obtained.   Follow up plan:   Tatjana Secretary/administrator

## 2021-07-07 NOTE — Progress Notes (Signed)
  Chronic Care Management   Note  07/07/2021 Name: Emily Moon MRN: [id number] DOB: [birth year]  Emily Moon is a 60 y.o. year old female who is a primary care patient of [name], NP. I reached out to [name] by phone today in response to a referral sent by Emily Moon PCP, [name], NP.   Emily Moon was given information about Chronic Care Management services today including:  CCM service includes personalized support from designated clinical staff supervised by her physician, including individualized plan of care and coordination with other care providers 24/7 contact phone numbers for assistance for urgent and routine care needs. Service will only be billed when office clinical staff spend 20 minutes or more in a month to coordinate care. Only one practitioner may furnish and bill the service in a calendar month. The patient may stop CCM services at any time (effective at the end of the month) by phone call to the office staff.   Patient agreed to services and verbal consent obtained.   Follow up plan:   Emily Moon

## 2021-07-09 DIAGNOSIS — Z7901 Long term (current) use of anticoagulants: Secondary | ICD-10-CM | POA: Diagnosis not present

## 2021-07-09 DIAGNOSIS — M109 Gout, unspecified: Secondary | ICD-10-CM | POA: Diagnosis not present

## 2021-07-09 DIAGNOSIS — E7800 Pure hypercholesterolemia, unspecified: Secondary | ICD-10-CM | POA: Diagnosis not present

## 2021-07-09 DIAGNOSIS — K219 Gastro-esophageal reflux disease without esophagitis: Secondary | ICD-10-CM | POA: Diagnosis not present

## 2021-07-09 DIAGNOSIS — E119 Type 2 diabetes mellitus without complications: Secondary | ICD-10-CM | POA: Diagnosis not present

## 2021-07-09 DIAGNOSIS — M5030 Other cervical disc degeneration, unspecified cervical region: Secondary | ICD-10-CM | POA: Diagnosis not present

## 2021-07-09 DIAGNOSIS — G35 Multiple sclerosis: Secondary | ICD-10-CM | POA: Diagnosis not present

## 2021-07-09 DIAGNOSIS — Z9181 History of falling: Secondary | ICD-10-CM | POA: Diagnosis not present

## 2021-07-09 DIAGNOSIS — M810 Age-related osteoporosis without current pathological fracture: Secondary | ICD-10-CM | POA: Diagnosis not present

## 2021-07-09 DIAGNOSIS — Z471 Aftercare following joint replacement surgery: Secondary | ICD-10-CM | POA: Diagnosis not present

## 2021-07-09 DIAGNOSIS — J449 Chronic obstructive pulmonary disease, unspecified: Secondary | ICD-10-CM | POA: Diagnosis not present

## 2021-07-09 DIAGNOSIS — Z79899 Other long term (current) drug therapy: Secondary | ICD-10-CM | POA: Diagnosis not present

## 2021-07-09 DIAGNOSIS — I10 Essential (primary) hypertension: Secondary | ICD-10-CM | POA: Diagnosis not present

## 2021-07-09 DIAGNOSIS — F319 Bipolar disorder, unspecified: Secondary | ICD-10-CM | POA: Diagnosis not present

## 2021-07-09 DIAGNOSIS — Z79891 Long term (current) use of opiate analgesic: Secondary | ICD-10-CM | POA: Diagnosis not present

## 2021-07-09 DIAGNOSIS — G4730 Sleep apnea, unspecified: Secondary | ICD-10-CM | POA: Diagnosis not present

## 2021-07-09 DIAGNOSIS — Z96641 Presence of right artificial hip joint: Secondary | ICD-10-CM | POA: Diagnosis not present

## 2021-07-11 DIAGNOSIS — E7800 Pure hypercholesterolemia, unspecified: Secondary | ICD-10-CM | POA: Diagnosis not present

## 2021-07-11 DIAGNOSIS — Z96641 Presence of right artificial hip joint: Secondary | ICD-10-CM | POA: Diagnosis not present

## 2021-07-11 DIAGNOSIS — Z7901 Long term (current) use of anticoagulants: Secondary | ICD-10-CM | POA: Diagnosis not present

## 2021-07-11 DIAGNOSIS — M5030 Other cervical disc degeneration, unspecified cervical region: Secondary | ICD-10-CM | POA: Diagnosis not present

## 2021-07-11 DIAGNOSIS — F319 Bipolar disorder, unspecified: Secondary | ICD-10-CM | POA: Diagnosis not present

## 2021-07-11 DIAGNOSIS — E119 Type 2 diabetes mellitus without complications: Secondary | ICD-10-CM | POA: Diagnosis not present

## 2021-07-11 DIAGNOSIS — M109 Gout, unspecified: Secondary | ICD-10-CM | POA: Diagnosis not present

## 2021-07-11 DIAGNOSIS — G35 Multiple sclerosis: Secondary | ICD-10-CM | POA: Diagnosis not present

## 2021-07-11 DIAGNOSIS — G4730 Sleep apnea, unspecified: Secondary | ICD-10-CM | POA: Diagnosis not present

## 2021-07-11 DIAGNOSIS — I10 Essential (primary) hypertension: Secondary | ICD-10-CM | POA: Diagnosis not present

## 2021-07-11 DIAGNOSIS — Z471 Aftercare following joint replacement surgery: Secondary | ICD-10-CM | POA: Diagnosis not present

## 2021-07-11 DIAGNOSIS — Z9181 History of falling: Secondary | ICD-10-CM | POA: Diagnosis not present

## 2021-07-11 DIAGNOSIS — J449 Chronic obstructive pulmonary disease, unspecified: Secondary | ICD-10-CM | POA: Diagnosis not present

## 2021-07-11 DIAGNOSIS — Z79899 Other long term (current) drug therapy: Secondary | ICD-10-CM | POA: Diagnosis not present

## 2021-07-11 DIAGNOSIS — M810 Age-related osteoporosis without current pathological fracture: Secondary | ICD-10-CM | POA: Diagnosis not present

## 2021-07-11 DIAGNOSIS — Z79891 Long term (current) use of opiate analgesic: Secondary | ICD-10-CM | POA: Diagnosis not present

## 2021-07-11 DIAGNOSIS — K219 Gastro-esophageal reflux disease without esophagitis: Secondary | ICD-10-CM | POA: Diagnosis not present

## 2021-07-15 ENCOUNTER — Telehealth (HOSPITAL_COMMUNITY): Payer: PPO | Admitting: Psychiatry

## 2021-07-16 ENCOUNTER — Other Ambulatory Visit: Payer: Self-pay | Admitting: Primary Care

## 2021-07-16 DIAGNOSIS — J3089 Other allergic rhinitis: Secondary | ICD-10-CM

## 2021-07-17 ENCOUNTER — Other Ambulatory Visit: Payer: Self-pay | Admitting: *Deleted

## 2021-07-17 DIAGNOSIS — M109 Gout, unspecified: Secondary | ICD-10-CM | POA: Diagnosis not present

## 2021-07-17 DIAGNOSIS — Z79899 Other long term (current) drug therapy: Secondary | ICD-10-CM | POA: Diagnosis not present

## 2021-07-17 DIAGNOSIS — Z7901 Long term (current) use of anticoagulants: Secondary | ICD-10-CM | POA: Diagnosis not present

## 2021-07-17 DIAGNOSIS — F319 Bipolar disorder, unspecified: Secondary | ICD-10-CM | POA: Diagnosis not present

## 2021-07-17 DIAGNOSIS — Z96641 Presence of right artificial hip joint: Secondary | ICD-10-CM | POA: Diagnosis not present

## 2021-07-17 DIAGNOSIS — J449 Chronic obstructive pulmonary disease, unspecified: Secondary | ICD-10-CM | POA: Diagnosis not present

## 2021-07-17 DIAGNOSIS — E7800 Pure hypercholesterolemia, unspecified: Secondary | ICD-10-CM | POA: Diagnosis not present

## 2021-07-17 DIAGNOSIS — Z9181 History of falling: Secondary | ICD-10-CM | POA: Diagnosis not present

## 2021-07-17 DIAGNOSIS — K219 Gastro-esophageal reflux disease without esophagitis: Secondary | ICD-10-CM | POA: Diagnosis not present

## 2021-07-17 DIAGNOSIS — Z79891 Long term (current) use of opiate analgesic: Secondary | ICD-10-CM | POA: Diagnosis not present

## 2021-07-17 DIAGNOSIS — Z471 Aftercare following joint replacement surgery: Secondary | ICD-10-CM | POA: Diagnosis not present

## 2021-07-17 DIAGNOSIS — M5030 Other cervical disc degeneration, unspecified cervical region: Secondary | ICD-10-CM | POA: Diagnosis not present

## 2021-07-17 DIAGNOSIS — G35 Multiple sclerosis: Secondary | ICD-10-CM | POA: Diagnosis not present

## 2021-07-17 DIAGNOSIS — G4730 Sleep apnea, unspecified: Secondary | ICD-10-CM | POA: Diagnosis not present

## 2021-07-17 DIAGNOSIS — M810 Age-related osteoporosis without current pathological fracture: Secondary | ICD-10-CM | POA: Diagnosis not present

## 2021-07-17 DIAGNOSIS — I10 Essential (primary) hypertension: Secondary | ICD-10-CM | POA: Diagnosis not present

## 2021-07-17 DIAGNOSIS — E119 Type 2 diabetes mellitus without complications: Secondary | ICD-10-CM | POA: Diagnosis not present

## 2021-07-17 MED ORDER — FENOFIBRATE 145 MG PO TABS: 145.0000 mg | ORAL_TABLET | Freq: Every day | ORAL | 2 refills | Status: DC

## 2021-07-22 DIAGNOSIS — Z9181 History of falling: Secondary | ICD-10-CM | POA: Diagnosis not present

## 2021-07-22 DIAGNOSIS — M810 Age-related osteoporosis without current pathological fracture: Secondary | ICD-10-CM | POA: Diagnosis not present

## 2021-07-22 DIAGNOSIS — Z7901 Long term (current) use of anticoagulants: Secondary | ICD-10-CM | POA: Diagnosis not present

## 2021-07-22 DIAGNOSIS — I10 Essential (primary) hypertension: Secondary | ICD-10-CM | POA: Diagnosis not present

## 2021-07-22 DIAGNOSIS — Z79891 Long term (current) use of opiate analgesic: Secondary | ICD-10-CM | POA: Diagnosis not present

## 2021-07-22 DIAGNOSIS — E119 Type 2 diabetes mellitus without complications: Secondary | ICD-10-CM | POA: Diagnosis not present

## 2021-07-22 DIAGNOSIS — G35 Multiple sclerosis: Secondary | ICD-10-CM | POA: Diagnosis not present

## 2021-07-22 DIAGNOSIS — Z79899 Other long term (current) drug therapy: Secondary | ICD-10-CM | POA: Diagnosis not present

## 2021-07-22 DIAGNOSIS — M109 Gout, unspecified: Secondary | ICD-10-CM | POA: Diagnosis not present

## 2021-07-22 DIAGNOSIS — Z471 Aftercare following joint replacement surgery: Secondary | ICD-10-CM | POA: Diagnosis not present

## 2021-07-22 DIAGNOSIS — F319 Bipolar disorder, unspecified: Secondary | ICD-10-CM | POA: Diagnosis not present

## 2021-07-22 DIAGNOSIS — G4730 Sleep apnea, unspecified: Secondary | ICD-10-CM | POA: Diagnosis not present

## 2021-07-22 DIAGNOSIS — M5030 Other cervical disc degeneration, unspecified cervical region: Secondary | ICD-10-CM | POA: Diagnosis not present

## 2021-07-22 DIAGNOSIS — K219 Gastro-esophageal reflux disease without esophagitis: Secondary | ICD-10-CM | POA: Diagnosis not present

## 2021-07-22 DIAGNOSIS — Z96641 Presence of right artificial hip joint: Secondary | ICD-10-CM | POA: Diagnosis not present

## 2021-07-22 DIAGNOSIS — E7800 Pure hypercholesterolemia, unspecified: Secondary | ICD-10-CM | POA: Diagnosis not present

## 2021-07-22 DIAGNOSIS — J449 Chronic obstructive pulmonary disease, unspecified: Secondary | ICD-10-CM | POA: Diagnosis not present

## 2021-07-22 NOTE — Telephone Encounter (Signed)
Please advise 

## 2021-07-23 ENCOUNTER — Other Ambulatory Visit: Payer: Self-pay | Admitting: Family Medicine

## 2021-07-23 DIAGNOSIS — G4733 Obstructive sleep apnea (adult) (pediatric): Secondary | ICD-10-CM | POA: Diagnosis not present

## 2021-07-23 DIAGNOSIS — M545 Low back pain, unspecified: Secondary | ICD-10-CM

## 2021-07-23 DIAGNOSIS — M2550 Pain in unspecified joint: Secondary | ICD-10-CM

## 2021-07-30 ENCOUNTER — Telehealth (INDEPENDENT_AMBULATORY_CARE_PROVIDER_SITE_OTHER): Payer: PPO | Admitting: Psychiatry

## 2021-07-30 ENCOUNTER — Other Ambulatory Visit: Payer: Self-pay

## 2021-07-30 DIAGNOSIS — F310 Bipolar disorder, current episode hypomanic: Secondary | ICD-10-CM | POA: Diagnosis not present

## 2021-07-30 DIAGNOSIS — F3160 Bipolar disorder, current episode mixed, unspecified: Secondary | ICD-10-CM

## 2021-07-30 MED ORDER — DULOXETINE HCL 20 MG PO CPEP: 40.0000 mg | ORAL_CAPSULE | Freq: Every day | ORAL | 2 refills | Status: DC

## 2021-07-30 MED ORDER — TEMAZEPAM 30 MG PO CAPS: ORAL_CAPSULE | ORAL | 1 refills | Status: DC

## 2021-07-30 MED ORDER — DESIPRAMINE HCL 25 MG PO TABS: 25.0000 mg | ORAL_TABLET | Freq: Every day | ORAL | 6 refills | Status: DC

## 2021-07-30 MED ORDER — LAMOTRIGINE 150 MG PO TABS: 300.0000 mg | ORAL_TABLET | Freq: Every day | ORAL | 1 refills | Status: DC

## 2021-07-30 MED ORDER — TRAZODONE HCL 50 MG PO TABS: 100.0000 mg | ORAL_TABLET | Freq: Every day | ORAL | 7 refills | Status: DC

## 2021-07-30 NOTE — Progress Notes (Signed)
The most painful.  This  Psychiatric Initial Adult Assessment   Patient Identification: Emily Moon MRN:  [id number] Date of Evaluation:  07/30/2021 Referral Source: From the community Chief Complaint: Feels exhausted    Today the patient was seen in person.  The patient actually seems to be at her baseline.  Patient has multiple medical problems.  She has diabetes mellitus with a hemoglobin A1c of about 6.5.  She has hypercholesterolemia.  While she gained a lot of weight actually her weight has been stable for the last few months.  Presently she has MS.  She has 1 good friend named [place].  Biggest issue going on at this time is the patient needs hip replacement.  She degree on the right surgeon.  Today she is seen with pain.  Patient also has spinal stenosis.  Her son is moved back towards the [place] and lives in [place] but she does not seem to have much contact with him.  Patient clearly has erratic sleep.  I think her biggest issue is chronic pain both from her spinal stenosis but more so from it.  Getting her pain under control I think is very important and hopefully she will have surgery as an important treatment interventions soon.  Patient: Emily Moon  Procedure(s) Performed: * No surgery found *  Anesthes  Patient location:   Post pain:   Post assessment:   Last Vitals:  There were no vitals filed for this visit.  Post vital signs:   Level of consciousness:   Complications: . Associated Signs/Symptoms: Depression Symptoms:  hopelessness, (Hypo) Manic Symptoms:   Anxiety Symptoms:   Psychotic Symptoms:   PTSD Symptoms:   Past Psychiatric History: The patient was seen in a psychiatric office and takes multiple medications she's not been recently hospitalized.  Previous Psychotropic Medications: Yes   Substance Abuse History in the last 12 months:  No.  Consequences of Substance Abuse: Negative  Past Medical History:  Past Medical  History:  Diagnosis Date   Arthritis    osteo   Asthma    Bipolar disorder ([place]) 05/21/2014   Cataracts, bilateral    COPD (chronic obstructive pulmonary disease) (HCC)    DDD (degenerative disc disease), cervical    also back   Depression    Diabetes mellitus without complication (HCC)    Dizziness    Positional   Edema    feet/legs   Fibromyalgia syndrome    Fungal infection    Finger nails   GERD (gastroesophageal reflux disease)    Gout    Heart palpitations    Hip dysplasia, congenital 09/15/2013   History of kidney stones    Hypercholesterolemia    Multiple sclerosis (HCC)    weakness   Osteoporosis    osteoarthritis   Pneumonia    PONV (postoperative nausea and vomiting)    no problem after cataract surgery   Prediabetes 07/22/2018   Psoriasis    Shortness of breath dyspnea    wheezing   Sleep apnea 2012   sleep study / slight, no interventions   Urinary frequency    Weight gain 06/21/2014    Past Surgical History:  Procedure Laterality Date   CATARACT EXTRACTION W/PHACO Left 05/21/2015   Procedure: CATARACT EXTRACTION PHACO AND INTRAOCULAR LENS PLACEMENT ([place]);  Surgeon: Birder Robson, MD;  Location: [facility];  Service: Ophthalmology;  Laterality: Left;  [place] 00:35 AP% 22.9 CDE 8.11 fluid pack lot [id number]   CATARACT EXTRACTION W/PHACO Right 06/04/2015  Procedure: CATARACT EXTRACTION PHACO AND INTRAOCULAR LENS PLACEMENT ([facility]);  Surgeon: Birder Robson, MD;  Location: [facility];  Service: Ophthalmology;  Laterality: Right;  US:00:48 AP%: 10.5 CDE:5.08 Fluid lot [id number]   CYSTOSCOPY/URETEROSCOPY/HOLMIUM LASER/STENT PLACEMENT Bilateral 09/22/2016   Procedure: CYSTOSCOPY/URETEROSCOPY/HOLMIUM LASER/STENT PLACEMENT;  Surgeon: Hollice Espy, MD;  Location: [facility];  Service: Urology;  Laterality: Bilateral;   EYE SURGERY  2015   tissue biopsy   FOOT SURGERY  2015   JOINT REPLACEMENT Left 2013   hip replacement   LITHOTRIPSY     PTOSIS REPAIR Bilateral  02/18/2016   Procedure: BILATERAL PTOSIS REPAIR UPPER EYELIDS;  Surgeon: Karle Starch, MD;  [facility];  Service: Ophthalmology;  Laterality: Bilateral;  LEAVE PT EARLY AM   thumb surgery Right    TONSILLECTOMY  1973   TOTAL HIP ARTHROPLASTY Right 07/02/2021   Procedure: TOTAL HIP ARTHROPLASTY ANTERIOR APPROACH;  Surgeon: Gaynelle Arabian, MD;  Location: [facility];  Service: Orthopedics;  Laterality: Right;    Family Psychiatric History:   Family History:  Family History  Problem Relation Age of Onset   Cancer Father        Abdomen with mastasis   Cancer Mother    Heart disease Mother    Kidney disease Neg Hx    Bladder Cancer Neg Hx    Prostate cancer Neg Hx    Kidney cancer Neg Hx     Social History:   Social History   Socioeconomic History   Marital status: Divorced    Spouse name: Not on file   Number of children: 1   Years of education: Not on file   Highest education level: Not on file  Occupational History   Occupation: Therapist, 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  Tobacco Use   Smoking status: Former    Packs/day: 1.00    Years: 25.00    Pack years: 25.00    Types: Cigarettes    Quit date: 2019    Years since quitting: 3.7   Smokeless tobacco: Never   Tobacco comments:    occasional use  Vaping Use   Vaping Use: Former  Substance and Sexual Activity   Alcohol use: No    Alcohol/week: 0.0 standard drinks   Drug use: Yes    Types: Methylphenidate, Marijuana    Comment: Delta 8 - once per day   Sexual activity: Never  Other Topics Concern   Not on file  Social History Narrative   Not on file   Social Determinants of Health   Financial Resource Strain: Low Risk    Difficulty of Paying Living Expenses: Not hard at all  Food Insecurity: No Food Insecurity   Worried About Charity fundraiser in the Last Year: Never true   Ran Out of Food in the Last Year: Never true  Transportation Needs: No Transportation Needs   Lack of  Transportation (Medical): No   Lack of Transportation (Non-Medical): No  Physical Activity: Inactive   Days of Exercise per Week: 0 days   Minutes of Exercise per Session: 0 min  Stress: No Stress Concern Present   Feeling of Stress : Not at all  Social Connections: Not on file    Additional Social History:   Allergies:   Allergies  Allergen Reactions   Albuterol Shortness Of Breath and Other (See Comments)    Makes pt feel jittery/ tacycardic   Crestor [Rosuvastatin] Other (See Comments)    Joint pain, muscle pain, and  hair loss   Halcion [Triazolam] Other (See Comments)    Dizziness,headaches,bladder problems   Levaquin [Levofloxacin In D5w] Diarrhea and Itching    Shoulder pain   Naproxen Sodium Swelling    Patient tolerates in small doses   Tylenol [Acetaminophen] Swelling    Patient tolerates in small doses   Cefaclor Other (See Comments)    Doesn't remember---unsure if actually allergic    Sulfa Antibiotics Itching    Unsure of reaction possibly itching   Tramadol Itching and Nausea And Vomiting   Zinc Other (See Comments)    constipation  constipation    Aripiprazole Other (See Comments)    Muscle tension/cramping   Diclofenac Sodium Rash    "made very sick"   Ibuprofen Swelling    Patient tolerates in small doses    Metabolic Disorder Labs: Lab Results  Component Value Date   HGBA1C 6.6 (H) 07/02/2021   MPG 142.72 07/02/2021   No results found for: PROLACTIN Lab Results  Component Value Date   CHOL 159 03/10/2021   TRIG 189.0 (H) 03/10/2021   HDL 39.90 03/10/2021   CHOLHDL 4 03/10/2021   VLDL 37.8 03/10/2021   LDLCALC 81 03/10/2021   LDLCALC 223 (H) 11/13/2014     Current Medications: Current Outpatient Medications  Medication Sig Dispense Refill   methocarbamol (ROBAXIN) 500 MG tablet Take 1-2 tablets (500-1,000 mg total) by mouth at bedtime as needed for muscle spasms. 180 tablet 0   Azelastine HCl 137 MCG/SPRAY SOLN PLACE 2 SPRAYS INTO BOTH  NOSTRILS 2 (TWO) TIMES DAILY 30 mL 5   budesonide-formoterol (SYMBICORT) 80-4.5 MCG/ACT inhaler Inhale 2 puffs into the lungs 2 (two) times daily. 1 each 6   calcipotriene-betamethasone (TACLONEX) ointment Apply topically daily. (Patient taking differently: Apply 1 application topically daily.) 60 g 0   cetirizine (ZYRTEC) 10 MG tablet Take 10 mg by mouth 2 (two) times daily.     ciclopirox (LOPROX) 0.77 % cream Apply topically 2 (two) times daily. 30 g 0   clindamycin (CLEOCIN T) 1 % external solution Apply 1-2 times daily to scalp as needed for itch. 60 mL 1   clobetasol (OLUX) 0.05 % topical foam Apply to affected areas at arms, legs, trunk twice a day up to 2 weeks. Avoid applying to face, groin, and axilla. Use as directed. Risk of skin atrophy with long-term use reviewed. 50 g 0   clobetasol (TEMOVATE) 0.05 % external solution Mix clobetasol solution with  CeraVe cream Use twice daily to affected areas.Avoid Face, groin and underarm 50 mL 0   Continuous Blood Gluc Sensor (FREESTYLE LIBRE 14 DAY SENSOR) MISC PLACE INTO SKIN FOR CONTINUOUS CHECK OF BLOOD SUGAR DAILY 6 each 2   cyanocobalamin (,VITAMIN B-12,) 1000 MCG/ML injection Inject 1 mL (1,000 mcg total) into the muscle every 30 (thirty) days. For B12 vitamin 3 mL 1   desipramine (NORPRAMIN) 25 MG tablet Take 1 tablet (25 mg total) by mouth daily. 30 tablet 6   diclofenac sodium (VOLTAREN) 1 % GEL Apply 4 g topically 4 (four) times daily. (Patient taking differently: Apply 4 g topically 4 (four) times daily as needed (pain).) 500 g 5   Dulaglutide (TRULICITY) 1.5 0000000 SOPN Inject 1.5 mg into the skin once a week. For diabetes. 6 mL 2   DULoxetine (CYMBALTA) 20 MG capsule Take 2 capsules (40 mg total) by mouth daily. 180 capsule 2   Evolocumab (REPATHA SURECLICK) [id number] MG/ML SOAJ Inject 1 Dose into the skin every 14 (fourteen) days. 2  mL 3   famotidine (PEPCID) 20 MG tablet Take 1 tablet (20 mg total) by mouth 2 (two) times daily. 180 tablet  3   fenofibrate (TRICOR) 145 MG tablet Take 1 tablet (145 mg total) by mouth daily. 30 tablet 2   fluocinonide (LIDEX) 0.05 % external solution Apply 1 application topically 2 (two) times daily as needed. Apply to scalp twice daily as need for itch (Patient taking differently: Apply 1 application topically 2 (two) times daily as needed (itchy scalp).) 180 mL 3   hydrocortisone 2.5 % cream Apply 1 application topically 2 (two) times daily as needed (underarm/groin rash).     hydroquinone 4 % cream Apply 1 application topically daily as needed (blemishes).  3   hydrOXYzine (VISTARIL) 50 MG capsule TAKE 1 CAPSLE BY MOUTH AT NOON, 1 AT 6 PM, AND 1 AS NEEDED (Patient taking differently: Take 100 mg by mouth at bedtime.) 270 capsule 2   lamoTRIgine (LAMICTAL) 150 MG tablet Take 2 tablets (300 mg total) by mouth at bedtime. 180 tablet 1   levalbuterol (XOPENEX HFA) 45 MCG/ACT inhaler INHALE 1 TO 2 PUFFS BY MOUTH EVERY 6 HOURS AS NEEDED FOR WHEEZE (Patient taking differently: Inhale 1-2 puffs into the lungs every 6 (six) hours as needed for wheezing.) 1 each 0   meclizine (ANTIVERT) 25 MG tablet Take 0.5-1 tablets (12.5-25 mg total) by mouth 3 (three) times daily as needed for dizziness (sedation caution). 30 tablet 0   metoprolol succinate (TOPROL-XL) 50 MG 24 hr tablet Take 1 tablet (50 mg total) by mouth daily. Take with or immediately following a meal. 30 tablet 0   neomycin-bacitracin-polymyxin (NEOSPORIN) [phone] ointment Apply 1 application topically 4 (four) times daily as needed (for cut/scrapes.).     oxyCODONE (OXY IR/ROXICODONE) 5 MG immediate release tablet Take 1-2 tablets (5-10 mg total) by mouth every 6 (six) hours as needed for moderate pain or severe pain. 42 tablet 0   pantoprazole (PROTONIX) 40 MG tablet Take 1 tablet (40 mg total) by mouth daily. For heartburn 90 tablet 3   phenazopyridine (PYRIDIUM) 95 MG tablet Take 95 mg by mouth 3 (three) times daily as needed for pain.      polyethylene glycol powder (GLYCOLAX/MIRALAX) powder MIX 17 GRAMS (1 CAPFUL) WITH 4-8 OZ OF LIQUID AND TAKE BY MOUTH TWICE DAILY AS NEEDED (Patient taking differently: Take 17 g by mouth 2 (two) times daily as needed for mild constipation.) 527 g 0   polyvinyl alcohol (LIQUIFILM TEARS) 1.4 % ophthalmic solution Place 2 drops into both eyes at bedtime.     SYRINGE/NEEDLE, DISP, 1 ML 23G X 1" 1 ML MISC Use as directed 6 each 0   temazepam (RESTORIL) 30 MG capsule TAKE ONE TO TWO CAPSULES BY MOUTH NIGHTLY AT BEDTIME 60 capsule 1   traZODone (DESYREL) 50 MG tablet Take 2 tablets (100 mg total) by mouth at bedtime. 60 tablet 7   valACYclovir (VALTREX) 1000 MG tablet TAKE 1 TABLET (1,000 MG TOTAL) BY MOUTH DAILY. FOR HERPES OUTBREAK PREVENTION 90 tablet 0   No current facility-administered medications for this visit.    Neurologic: Headache: No Seizure: No Paresthesias:No  Musculoskeletal: Strength & Muscle Tone: within normal limits Gait & Station: normal Patient leans: N/A  Psychiatric Specialty Exam: ROS  Last menstrual period 08/23/2014.There is no height or weight on file to calculate BMI.  General Appearance: Casual  Eye Contact:  Good  Speech:  Clear and Coherent  Volume:  Normal  Mood:  Negative  Affect:  Appropriate  Thought Process:  Goal Directed  Orientation:  NA  Thought Content:  Logical  Suicidal Thoughts:  No  Homicidal Thoughts:  No  Memory:  Negative  Judgement:  Good  Insight:  Good  Psychomotor Activity:  Normal  Concentration:    Recall:    Fund of Knowledge:Good  Language: Good  Akathisia:  No  Handed:  Right  AIMS (if indicated):    Assets:  Desire for Improvement  ADL's:  Intact  Cognition: WNL  Sleep:      9/14/20222:30 PM   This patient's first problem is that of major depression.  She will continue taking desipramine, Cymbalta at a moderate dose.  She will continue Lamictal as prescribed.  Her second problem is insomnia.  The patient will  continue taking trazodone and Vistaril for this condition.  Today we recommended that she does get into some type of therapy even if it works well.  This patient is not suicidal.  She seems to function only fairly well.  She will return to see me in 3 to 4 months.

## 2021-07-30 NOTE — Progress Notes (Signed)
The most painful.  This  Psychiatric Initial Adult Assessment   Patient Identification: Emily Moon MRN:  [id number] Date of Evaluation:  07/30/2021 Referral Source: From the community Chief Complaint: Feels exhausted  Today patient is recovering from hip surgery.  Her mood is stable.  She is very frustrated with her medical care.  She takes all her medicines as prescribed.  She denies any new depression.  She still feels isolated from her family.  I believe her MS is fairly stable.  She wanted more physical therapy but could not get it.  Patient lives alone.  At this time she is functioning only fairly well.  Today I give her the telephone number of [facility].    Patient: Emily Moon  Procedure(s) Performed: * No surgery found *  Anesthes  Patient location:   Post pain:   Post assessment:   Last Vitals:  There were no vitals filed for this visit.  Post vital signs:   Level of consciousness:   Complications: . Associated Signs/Symptoms: Depression Symptoms:  hopelessness, (Hypo) Manic Symptoms:   Anxiety Symptoms:   Psychotic Symptoms:   PTSD Symptoms:   Past Psychiatric History: The patient was seen in a psychiatric office and takes multiple medications she's not been recently hospitalized.  Previous Psychotropic Medications: Yes   Substance Abuse History in the last 12 months:  No.  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   History of kidney stones    Hypercholesterolemia    Multiple sclerosis (HCC)    weakness   Osteoporosis    osteoarthritis   Pneumonia    PONV (postoperative nausea and vomiting)    no problem after cataract surgery   Prediabetes 07/22/2018   Psoriasis    Shortness of breath dyspnea    wheezing   Sleep apnea 2012   sleep study / slight, no interventions   Urinary frequency    Weight gain 06/21/2014    Past Surgical History:  Procedure Laterality Date   CATARACT EXTRACTION W/PHACO Left 05/21/2015   Procedure: CATARACT EXTRACTION PHACO AND INTRAOCULAR LENS PLACEMENT ([place]);  Surgeon: Birder Robson, MD;  Location: [facility];  Service: Ophthalmology;  Laterality: Left;  [place] 00:35 AP% 22.9 CDE 8.11 fluid pack lot [id number]   CATARACT EXTRACTION W/PHACO Right 06/04/2015   Procedure: CATARACT EXTRACTION PHACO AND INTRAOCULAR LENS PLACEMENT ([facility]);  Surgeon: Birder Robson, MD;  Location: [facility];  Service: Ophthalmology;  Laterality: Right;  US:00:48 AP%: 10.5 CDE:5.08 Fluid lot [id number]   CYSTOSCOPY/URETEROSCOPY/HOLMIUM LASER/STENT PLACEMENT Bilateral 09/22/2016   Procedure: CYSTOSCOPY/URETEROSCOPY/HOLMIUM LASER/STENT PLACEMENT;  Surgeon: Hollice Espy, MD;  Location: [facility];  Service: Urology;  Laterality: Bilateral;   EYE SURGERY  2015   tissue biopsy   FOOT SURGERY  2015   JOINT REPLACEMENT Left 2013   hip replacement   LITHOTRIPSY  PTOSIS REPAIR Bilateral 02/18/2016   Procedure: BILATERAL PTOSIS REPAIR UPPER EYELIDS;  Surgeon: Karle Starch, MD;  Location: [place];  Service: Ophthalmology;  Laterality: Bilateral;  LEAVE PT EARLY AM   thumb surgery Right    TONSILLECTOMY  1973   TOTAL HIP ARTHROPLASTY Right 07/02/2021   Procedure: TOTAL HIP ARTHROPLASTY ANTERIOR APPROACH;  Surgeon: Gaynelle Arabian, MD;  Location: [facility];  Service: Orthopedics;  Laterality: Right;    Family Psychiatric History:   Family History:  Family  History  Problem Relation Age of Onset   Cancer Father        Abdomen with mastasis   Cancer Mother    Heart disease Mother    Kidney disease Neg Hx    Bladder Cancer Neg Hx    Prostate cancer Neg Hx    Kidney cancer Neg Hx     Social History:   Social History   Socioeconomic History   Marital status: Divorced    Spouse name: Not on file   Number of children: 1   Years of education: Not on file   Highest education level: Not on file  Occupational History   Occupation: Therapist, 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  Tobacco Use   Smoking status: Former    Packs/day: 1.00    Years: 25.00    Pack years: 25.00    Types: Cigarettes    Quit date: 2019    Years since quitting: 3.7   Smokeless tobacco: Never   Tobacco comments:    occasional use  Vaping Use   Vaping Use: Former  Substance and Sexual Activity   Alcohol use: No    Alcohol/week: 0.0 standard drinks   Drug use: Yes    Types: Methylphenidate, Marijuana    Comment: [place] 8 - once per day   Sexual activity: Never  Other Topics Concern   Not on file  Social History Narrative   Not on file   Social Determinants of Health   Financial Resource Moon: Low Risk    Difficulty of Paying Living Expenses: Not hard at all  Food Insecurity: No Food Insecurity   Worried About Charity fundraiser in the Last Year: Never true   Ran Out of Food in the Last Year: Never true  Transportation Needs: No Transportation Needs   Lack of Transportation (Medical): No   Lack of Transportation (Non-Medical): No  Physical Activity: Inactive   Days of Exercise per Week: 0 days   Minutes of Exercise per Session: 0 min  Stress: No Stress Concern Present   Feeling of Stress : Not at all  Social Connections: Not on file    Additional Social History:   Allergies:   Allergies  Allergen Reactions   Albuterol Shortness Of Breath and Other (See Comments)    Makes pt feel jittery/ tacycardic   Crestor [Rosuvastatin] Other  (See Comments)    Joint pain, muscle pain, and hair loss   Halcion [Triazolam] Other (See Comments)    Dizziness,headaches,bladder problems   Levaquin [Levofloxacin In D5w] Diarrhea and Itching    Shoulder pain   Naproxen Sodium Swelling    Patient tolerates in small doses   Tylenol [Acetaminophen] Swelling    Patient tolerates in small doses   Cefaclor Other (See Comments)    Doesn't remember---unsure if actually allergic    Sulfa Antibiotics Itching    Unsure of reaction possibly itching   Tramadol Itching and Nausea And Vomiting   Zinc  Other (See Comments)    constipation  constipation    Aripiprazole Other (See Comments)    Muscle tension/cramping   Diclofenac Sodium Rash    "made very sick"   Ibuprofen Swelling    Patient tolerates in small doses    Metabolic Disorder Labs: Lab Results  Component Value Date   HGBA1C 6.6 (H) 07/02/2021   MPG 142.72 07/02/2021   No results found for: PROLACTIN Lab Results  Component Value Date   CHOL 159 03/10/2021   TRIG 189.0 (H) 03/10/2021   HDL 39.90 03/10/2021   CHOLHDL 4 03/10/2021   VLDL 37.8 03/10/2021   LDLCALC 81 03/10/2021   LDLCALC 223 (H) 11/13/2014     Current Medications: Current Outpatient Medications  Medication Sig Dispense Refill   methocarbamol (ROBAXIN) 500 MG tablet Take 1-2 tablets (500-1,000 mg total) by mouth at bedtime as needed for muscle spasms. 180 tablet 0   Azelastine HCl 137 MCG/SPRAY SOLN PLACE 2 SPRAYS INTO BOTH NOSTRILS 2 (TWO) TIMES DAILY 30 mL 5   budesonide-formoterol (SYMBICORT) 80-4.5 MCG/ACT inhaler Inhale 2 puffs into the lungs 2 (two) times daily. 1 each 6   calcipotriene-betamethasone (TACLONEX) ointment Apply topically daily. (Patient taking differently: Apply 1 application topically daily.) 60 g 0   cetirizine (ZYRTEC) 10 MG tablet Take 10 mg by mouth 2 (two) times daily.     ciclopirox (LOPROX) 0.77 % cream Apply topically 2 (two) times daily. 30 g 0   clindamycin (CLEOCIN T) 1 %  external solution Apply 1-2 times daily to scalp as needed for itch. 60 mL 1   clobetasol (OLUX) 0.05 % topical foam Apply to affected areas at arms, legs, trunk twice a day up to 2 weeks. Avoid applying to face, groin, and axilla. Use as directed. Risk of skin atrophy with long-term use reviewed. 50 g 0   clobetasol (TEMOVATE) 0.05 % external solution Mix clobetasol solution with  CeraVe cream Use twice daily to affected areas.Avoid Face, groin and underarm 50 mL 0   Continuous Blood Gluc Sensor (FREESTYLE LIBRE 14 DAY SENSOR) MISC PLACE INTO SKIN FOR CONTINUOUS CHECK OF BLOOD SUGAR DAILY 6 each 2   cyanocobalamin (,VITAMIN B-12,) 1000 MCG/ML injection Inject 1 mL (1,000 mcg total) into the muscle every 30 (thirty) days. For B12 vitamin 3 mL 1   desipramine (NORPRAMIN) 25 MG tablet Take 1 tablet (25 mg total) by mouth daily. 30 tablet 6   diclofenac sodium (VOLTAREN) 1 % GEL Apply 4 g topically 4 (four) times daily. (Patient taking differently: Apply 4 g topically 4 (four) times daily as needed (pain).) 500 g 5   Dulaglutide (TRULICITY) 1.5 0000000 SOPN Inject 1.5 mg into the skin once a week. For diabetes. 6 mL 2   DULoxetine (CYMBALTA) 20 MG capsule Take 2 capsules (40 mg total) by mouth daily. 180 capsule 2   Evolocumab (REPATHA SURECLICK) [id number] MG/ML SOAJ Inject 1 Dose into the skin every 14 (fourteen) days. 2 mL 3   famotidine (PEPCID) 20 MG tablet Take 1 tablet (20 mg total) by mouth 2 (two) times daily. 180 tablet 3   fenofibrate (TRICOR) 145 MG tablet Take 1 tablet (145 mg total) by mouth daily. 30 tablet 2   fluocinonide (LIDEX) 0.05 % external solution Apply 1 application topically 2 (two) times daily as needed. Apply to scalp twice daily as need for itch (Patient taking differently: Apply 1 application topically 2 (two) times daily as needed (itchy scalp).) 180 mL 3   hydrocortisone 2.5 %  cream Apply 1 application topically 2 (two) times daily as needed (underarm/groin rash).      hydroquinone 4 % cream Apply 1 application topically daily as needed (blemishes).  3   hydrOXYzine (VISTARIL) 50 MG capsule TAKE 1 CAPSLE BY MOUTH AT NOON, 1 AT 6 PM, AND 1 AS NEEDED (Patient taking differently: Take 100 mg by mouth at bedtime.) 270 capsule 2   lamoTRIgine (LAMICTAL) 150 MG tablet Take 2 tablets (300 mg total) by mouth at bedtime. 180 tablet 1   levalbuterol (XOPENEX HFA) 45 MCG/ACT inhaler INHALE 1 TO 2 PUFFS BY MOUTH EVERY 6 HOURS AS NEEDED FOR WHEEZE (Patient taking differently: Inhale 1-2 puffs into the lungs every 6 (six) hours as needed for wheezing.) 1 each 0   meclizine (ANTIVERT) 25 MG tablet Take 0.5-1 tablets (12.5-25 mg total) by mouth 3 (three) times daily as needed for dizziness (sedation caution). 30 tablet 0   metoprolol succinate (TOPROL-XL) 50 MG 24 hr tablet Take 1 tablet (50 mg total) by mouth daily. Take with or immediately following a meal. 30 tablet 0   neomycin-bacitracin-polymyxin (NEOSPORIN) [phone] ointment Apply 1 application topically 4 (four) times daily as needed (for cut/scrapes.).     oxyCODONE (OXY IR/ROXICODONE) 5 MG immediate release tablet Take 1-2 tablets (5-10 mg total) by mouth every 6 (six) hours as needed for moderate pain or severe pain. 42 tablet 0   pantoprazole (PROTONIX) 40 MG tablet Take 1 tablet (40 mg total) by mouth daily. For heartburn 90 tablet 3   phenazopyridine (PYRIDIUM) 95 MG tablet Take 95 mg by mouth 3 (three) times daily as needed for pain.     polyethylene glycol powder (GLYCOLAX/MIRALAX) powder MIX 17 GRAMS (1 CAPFUL) WITH 4-8 OZ OF LIQUID AND TAKE BY MOUTH TWICE DAILY AS NEEDED (Patient taking differently: Take 17 g by mouth 2 (two) times daily as needed for mild constipation.) 527 g 0   polyvinyl alcohol (LIQUIFILM TEARS) 1.4 % ophthalmic solution Place 2 drops into both eyes at bedtime.     SYRINGE/NEEDLE, DISP, 1 ML 23G X 1" 1 ML MISC Use as directed 6 each 0   temazepam (RESTORIL) 30 MG capsule TAKE ONE TO TWO  CAPSULES BY MOUTH NIGHTLY AT BEDTIME 60 capsule 1   traZODone (DESYREL) 50 MG tablet Take 2 tablets (100 mg total) by mouth at bedtime. 60 tablet 7   valACYclovir (VALTREX) 1000 MG tablet TAKE 1 TABLET (1,000 MG TOTAL) BY MOUTH DAILY. FOR HERPES OUTBREAK PREVENTION 90 tablet 0   No current facility-administered medications for this visit.    Neurologic: Headache: No Seizure: No Paresthesias:No  Musculoskeletal: Strength & Muscle Tone: within normal limits Gait & Station: normal Patient leans: N/A  Psychiatric Specialty Exam: ROS  Last menstrual period 08/23/2014.There is no height or weight on file to calculate BMI.  General Appearance: Casual  Eye Contact:  Good  Speech:  Clear and Coherent  Volume:  Normal  Mood:  Negative  Affect:  Appropriate  Thought Process:  Goal Directed  Orientation:  NA  Thought Content:  Logical  Suicidal Thoughts:  No  Homicidal Thoughts:  No  Memory:  Negative  Judgement:  Good  Insight:  Good  Psychomotor Activity:  Normal  Concentration:    Recall:    Fund of Knowledge:Good  Language: Good  Akathisia:  No  Handed:  Right  AIMS (if indicated):    Assets:  Desire for Improvement  ADL's:  Intact  Cognition: WNL  Sleep:  9/14/20222:38 PM   At this time the patient will continue taking all the medications prescribed.  She is diagnosed with major clinical depression.  She will continue taking desipramine and Cymbalta.  Her second problem is that of personality disorder.  The patient will continue taking Lamictal.  Her third problem is that of insomnia.  The patient will continue taking Vistaril and trazodone and Restoril.  She will be seen again in just a few months.

## 2021-08-01 ENCOUNTER — Other Ambulatory Visit (HOSPITAL_COMMUNITY): Payer: Self-pay | Admitting: Psychiatry

## 2021-08-05 ENCOUNTER — Telehealth (HOSPITAL_COMMUNITY): Payer: Self-pay

## 2021-08-05 DIAGNOSIS — G4733 Obstructive sleep apnea (adult) (pediatric): Secondary | ICD-10-CM | POA: Diagnosis not present

## 2021-08-05 NOTE — Telephone Encounter (Signed)
Patient called regarding her Temazepam 30mg . Patient stated she couldn't get her medication. Writer called the pharmacy and did a verbal order over the phone in order for the pharmacist to get it ready for the patient. Pharmacist will notify patient when ready for pickup

## 2021-08-08 ENCOUNTER — Ambulatory Visit: Payer: PPO | Admitting: Cardiology

## 2021-08-08 ENCOUNTER — Telehealth: Payer: Self-pay

## 2021-08-08 ENCOUNTER — Other Ambulatory Visit: Payer: Self-pay

## 2021-08-08 ENCOUNTER — Encounter: Payer: Self-pay | Admitting: Cardiology

## 2021-08-08 VITALS — BP 118/64 | HR 106 | Ht 69.0 in | Wt 232.0 lb

## 2021-08-08 DIAGNOSIS — R000 Tachycardia, unspecified: Secondary | ICD-10-CM

## 2021-08-08 DIAGNOSIS — E782 Mixed hyperlipidemia: Secondary | ICD-10-CM

## 2021-08-08 DIAGNOSIS — I4711 Inappropriate sinus tachycardia, so stated: Secondary | ICD-10-CM

## 2021-08-08 MED ORDER — METOPROLOL SUCCINATE ER 50 MG PO TB24: 75.0000 mg | ORAL_TABLET | Freq: Every day | ORAL | 3 refills | Status: DC

## 2021-08-08 NOTE — Progress Notes (Signed)
Cardiology Office Note:    Date:  08/08/2021   ID:  Emily Moon, DOB 01-Mar-1961, MRN [id number]  PCP:  [name], NP  [facility] Cardiologist:  [name] Sable, MD  [place] Electrophysiologist:  None   Referring MD: [name], NP   Chief Complaint  Patient presents with   Other    Patient c.o tachycardia and SOB. Meds reviewed verbally with patient.     History of Present Illness:    Emily Moon is a 60 y.o. female with a hx of asthma, bipolar, OSA, diabetes, hyperlipidemia, GERD, former smoker x15 years who presents for follow-up.    Last seen due to tachycardia.  Diagnosed with inappropriate sinus tachycardia.  Toprol-XL was started, subsequently increased to 50 mg daily with improvement in symptoms initially.  Over the past several days, her smart watch has noted elevated heart rates.  States baseline heart rates have been around low 100s to 125.  Recently had a hip replacement, taking Advil for this.   Prior notes. Echo 05/2021 EF 60 to 65%  She has obstructive pulmonary disease and is seeing pulmonary medicine for this.  She used to follow-up in [place] at the hyperlipidemia clinic where Repatha was started.  She has allergies to statins.  Established care in [place] for better commute.  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   History of kidney stones    Hypercholesterolemia    Multiple sclerosis (HCC)    weakness   Osteoporosis     osteoarthritis   Pneumonia    PONV (postoperative nausea and vomiting)    no problem after cataract surgery   Prediabetes 07/22/2018   Psoriasis    Shortness of breath dyspnea    wheezing   Sleep apnea 2012   sleep study / slight, no interventions   Urinary frequency    Weight gain 06/21/2014    Past Surgical History:  Procedure Laterality Date   CATARACT EXTRACTION W/PHACO Left 05/21/2015   Procedure: CATARACT EXTRACTION PHACO AND INTRAOCULAR LENS PLACEMENT ([place]);  Surgeon: Birder Robson, MD;  Location: [facility] OR[facility];  Service: Ophthalmology;  Laterality: Left;  [place] 00:35 AP% 22.9 CDE 8.11 fluid pack lot #[id number]   CATARACT EXTRACTION W/PHACO Right 06/04/2015   Procedure: CATARACT EXTRACTION PHACO AND INTRAOCULAR LENS PLACEMENT (IOC);  Surgeon: Birder Robson, MD;  Location: [facility];  Service: Ophthalmology;  Laterality: Right;  US:00:48 AP%: 10.5 CDE:5.08 Fluid lot #[id number]   CYSTOSCOPY/URETEROSCOPY/HOLMIUM LASER/STENT PLACEMENT Bilateral 09/22/2016   Procedure: CYSTOSCOPY/URETEROSCOPY/HOLMIUM LASER/STENT PLACEMENT;  Surgeon: Hollice Espy, MD;  Location: [facility];  Service: Urology;  Laterality: Bilateral;   EYE SURGERY  2015   tissue biopsy   FOOT SURGERY  2015   JOINT REPLACEMENT Left 2013   hip replacement   LITHOTRIPSY     PTOSIS REPAIR Bilateral 02/18/2016   Procedure: BILATERAL PTOSIS REPAIR UPPER EYELIDS;  Surgeon: Karle Starch,  MD;  Location: [place];  Service: Ophthalmology;  Laterality: Bilateral;  LEAVE PT EARLY AM   thumb surgery Right    TONSILLECTOMY  1973   TOTAL HIP ARTHROPLASTY Right 07/02/2021   Procedure: TOTAL HIP ARTHROPLASTY ANTERIOR APPROACH;  Surgeon: Gaynelle Arabian, MD;  Location: [facility];  Service: Orthopedics;  Laterality: Right;    Current Medications: Current Meds  Medication Sig   Azelastine HCl 137 MCG/SPRAY SOLN PLACE 2 SPRAYS INTO BOTH NOSTRILS 2 (TWO) TIMES DAILY   budesonide-formoterol (SYMBICORT) 80-4.5 MCG/ACT inhaler Inhale  2 puffs into the lungs 2 (two) times daily.   calcipotriene-betamethasone (TACLONEX) ointment Apply topically daily.   cetirizine (ZYRTEC) 10 MG tablet Take 10 mg by mouth 2 (two) times daily.   ciclopirox (LOPROX) 0.77 % cream Apply topically 2 (two) times daily.   clindamycin (CLEOCIN T) 1 % external solution Apply 1-2 times daily to scalp as needed for itch.   clobetasol (OLUX) 0.05 % topical foam Apply to affected areas at arms, legs, trunk twice a day up to 2 weeks. Avoid applying to face, groin, and axilla. Use as directed. Risk of skin atrophy with long-term use reviewed.   clobetasol (TEMOVATE) 0.05 % external solution Mix clobetasol solution with  CeraVe cream Use twice daily to affected areas.Avoid Face, groin and underarm   Continuous Blood Gluc Sensor (FREESTYLE LIBRE 14 DAY SENSOR) MISC PLACE INTO SKIN FOR CONTINUOUS CHECK OF BLOOD SUGAR DAILY   cyanocobalamin (,VITAMIN B-12,) 1000 MCG/ML injection Inject 1 mL (1,000 mcg total) into the muscle every 30 (thirty) days. For B12 vitamin   desipramine (NORPRAMIN) 25 MG tablet Take 1 tablet (25 mg total) by mouth daily.   diclofenac sodium (VOLTAREN) 1 % GEL Apply 4 g topically 4 (four) times daily.   Dulaglutide (TRULICITY) 1.5 ZO/1.0RU SOPN Inject 1.5 mg into the skin once a week. For diabetes.   DULoxetine (CYMBALTA) 20 MG capsule Take 2 capsules (40 mg total) by mouth daily.   Evolocumab (REPATHA SURECLICK) 045 MG/ML SOAJ Inject 1 Dose into the skin every 14 (fourteen) days.   famotidine (PEPCID) 20 MG tablet Take 1 tablet (20 mg total) by mouth 2 (two) times daily.   fenofibrate (TRICOR) 145 MG tablet Take 1 tablet (145 mg total) by mouth daily.   fluocinonide (LIDEX) 0.05 % external solution Apply 1 application topically 2 (two) times daily as needed. Apply to scalp twice daily as need for itch   hydrocortisone 2.5 % cream Apply 1 application topically 2 (two) times daily as needed (underarm/groin rash).   hydroquinone 4 % cream Apply  1 application topically daily as needed (blemishes).   hydrOXYzine (VISTARIL) 50 MG capsule TAKE 1 CAPSLE BY MOUTH AT NOON, 1 AT 6 PM, AND 1 AS NEEDED   lamoTRIgine (LAMICTAL) 150 MG tablet Take 2 tablets (300 mg total) by mouth at bedtime.   levalbuterol (XOPENEX HFA) 45 MCG/ACT inhaler INHALE 1 TO 2 PUFFS BY MOUTH EVERY 6 HOURS AS NEEDED FOR WHEEZE   meclizine (ANTIVERT) 25 MG tablet Take 0.5-1 tablets (12.5-25 mg total) by mouth 3 (three) times daily as needed for dizziness (sedation caution).   methocarbamol (ROBAXIN) 500 MG tablet Take 1-2 tablets (500-1,000 mg total) by mouth at bedtime as needed for muscle spasms.   neomycin-bacitracin-polymyxin (NEOSPORIN) [phone] ointment Apply 1 application topically 4 (four) times daily as needed (for cut/scrapes.).   pantoprazole (PROTONIX) 40 MG tablet Take 1 tablet (40 mg total) by mouth daily. For heartburn   phenazopyridine (PYRIDIUM) 95 MG  tablet Take 95 mg by mouth 3 (three) times daily as needed for pain.   polyethylene glycol powder (GLYCOLAX/MIRALAX) powder MIX 17 GRAMS (1 CAPFUL) WITH 4-8 OZ OF LIQUID AND TAKE BY MOUTH TWICE DAILY AS NEEDED   polyvinyl alcohol (LIQUIFILM TEARS) 1.4 % ophthalmic solution Place 2 drops into both eyes at bedtime.   SYRINGE/NEEDLE, DISP, 1 ML 23G X 1" 1 ML MISC Use as directed   temazepam (RESTORIL) 30 MG capsule TAKE ONE TO TWO CAPSULES BY MOUTH NIGHTLY AT BEDTIME   traZODone (DESYREL) 50 MG tablet Take 2 tablets (100 mg total) by mouth at bedtime.   valACYclovir (VALTREX) 1000 MG tablet TAKE 1 TABLET (1,000 MG TOTAL) BY MOUTH DAILY. FOR HERPES OUTBREAK PREVENTION   [DISCONTINUED] metoprolol succinate (TOPROL-XL) 50 MG 24 hr tablet Take 1 tablet (50 mg total) by mouth daily. Take with or immediately following a meal.     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  Tobacco Use   Smoking status: Former    Packs/day: 1.00    Years: 25.00    Pack years: 25.00    Types: Cigarettes    Quit date: 2019    Years since quitting: 3.7   Smokeless tobacco: Never   Tobacco comments:    occasional use  Vaping Use   Vaping Use: Former  Substance and Sexual Activity   Alcohol use: No    Alcohol/week: 0.0 standard drinks   Drug use: Yes    Types: Methylphenidate, Marijuana    Comment: Delta 8 - once per day   Sexual activity: Never  Other Topics Concern   Not on file  Social History Narrative   Not on file   Social Determinants of Health   Financial Resource Strain: Low Risk    Difficulty of Paying Living Expenses: Not hard at all  Food Insecurity: No Food Insecurity   Worried About Charity fundraiser in the Last Year: Never true   [place] in the Last Year: Never true  Transportation Needs: No Transportation Needs   Lack of Transportation (Medical): No   Lack of Transportation (Non-Medical): No  Physical Activity: Inactive   Days of Exercise per Week: 0 days   Minutes of Exercise per Session: 0 min  Stress: No Stress Concern Present   Feeling of Stress : Not at all  Social Connections: Not on file     Family History: The patient's family history includes Cancer in her father and mother; Heart disease in her mother. There is no history of Kidney disease, Bladder Cancer, Prostate cancer, or Kidney cancer.  ROS:   Please see the history of present illness.     All other systems reviewed and are negative.  EKGs/Labs/Other Studies Reviewed:    The following studies were reviewed today:   EKG:  EKG  is ordered today.  EKG shows sinus tachycardia, heart rate 106  Recent Labs: 06/23/2021: ALT 44 07/03/2021: BUN 18; Creatinine, Ser 0.94; Hemoglobin 10.4; Platelets 384; Potassium 3.6; Sodium 139  Recent Lipid  Panel    Component Value Date/Time   CHOL 159 03/10/2021 1628   TRIG 189.0 (H) 03/10/2021 1628   HDL 39.90 03/10/2021 1628   CHOLHDL 4 03/10/2021 1628   VLDL 37.8 03/10/2021 1628   LDLCALC 81 03/10/2021 1628   LDLDIRECT 53.0 08/01/2020 1254    Physical Exam:    VS:  BP 118/64 (BP Location: Right Arm, Patient Position: Sitting, Cuff Size: Normal)   Pulse (!) 106   Ht 5\' 9"  (1.753 m)   Wt 232 lb (105.2 kg)   LMP 08/23/2014   SpO2 98%   BMI 34.26 kg/m     Wt Readings from Last 3 Encounters:  08/08/21 232 lb (105.2 kg)  07/02/21 239 lb (108.4 kg)  06/26/21 239 lb (108.4 kg)     GEN:  Well nourished, well developed in no acute distress HEENT: Normal NECK: No JVD; No carotid bruits LYMPHATICS: No lymphadenopathy CARDIAC: RRR, no murmurs, rubs, gallops RESPIRATORY:  Clear to auscultation without rales,  ABDOMEN: Soft, non-tender, distended MUSCULOSKELETAL:  No edema; No deformity  SKIN: Warm and dry NEUROLOGIC:  Alert and oriented x 3 PSYCHIATRIC:  Normal affect   ASSESSMENT:    1. Inappropriate sinus tachycardia   2. Mixed hyperlipidemia     PLAN:    In order of problems listed above:  Inappropriate sinus tach, heart rate elevated.  Increase Toprol-XL to 75 mg daily..  Continue Toprol-XL 50 mg daily. Mixed hyperlipidemia, intolerant to statins..  Continue Repatha, fenofibrate.  Follow-up in 3 months   Medication Adjustments/Labs and Tests Ordered: Current medicines are reviewed at length with the patient today.  Concerns regarding medicines are outlined above.  Orders Placed This Encounter  Procedures   EKG 12-Lead    Meds ordered this encounter  Medications   metoprolol succinate (TOPROL-XL) 50 MG 24 hr tablet    Sig: Take 1.5 tablets (75 mg  total) by mouth daily. Take with or immediately following a meal.    Dispense:  45 tablet    Refill:  3     Patient Instructions  Medication Instructions:   Your physician has recommended you make the following change in your medication:   INCREASE your Toprol XL to 75 MG once a day.  *If you need a refill on your cardiac medications before your next appointment, please call your pharmacy*   Lab Work: None ordered If you have labs (blood work) drawn today and your tests are completely normal, you will receive your results only by: [place] (if you have MyChart) OR A paper copy in the mail If you have any lab test that is abnormal or we need to change your treatment, we will call you to review the results.   Testing/Procedures: None ordered   Follow-Up: At [facility], you and your health needs are our priority.  As part of our continuing mission to provide you with exceptional heart care, we have created designated Provider Care Teams.  These Care Teams include your primary Cardiologist (physician) and Advanced Practice Providers (APPs -  Physician Assistants and Nurse Practitioners) who all work together to provide you with the care you need, when you need it.  We recommend signing up for the patient portal called "MyChart".  Sign up information is provided on this After Visit Summary.  MyChart is used to connect with patients for Virtual Visits (Telemedicine).  Patients are able to view lab/test results, encounter notes, upcoming appointments, etc.  Non-urgent messages can be sent to your provider as well.  To learn more about what you can do with MyChart, go to [email or web address].    Your next appointment:    Already scheduled on 10/27/21 at 1:40 pm  The format for your next appointment:   In Person  Provider:   [name] Sable, MD   Other Instructions    Signed, [name] Sable, MD  08/08/2021 5:09 PM    [place]

## 2021-08-08 NOTE — Patient Instructions (Addendum)
Medication Instructions:   Your physician has recommended you make the following change in your medication:   INCREASE your Toprol XL to 75 MG once a day.  *If you need a refill on your cardiac medications before your next appointment, please call your pharmacy*   Lab Work: None ordered If you have labs (blood work) drawn today and your tests are completely normal, you will receive your results only by: [place] (if you have MyChart) OR A paper copy in the mail If you have any lab test that is abnormal or we need to change your treatment, we will call you to review the results.   Testing/Procedures: None ordered   Follow-Up: At [facility], you and your health needs are our priority.  As part of our continuing mission to provide you with exceptional heart care, we have created designated Provider Care Teams.  These Care Teams include your primary Cardiologist (physician) and Advanced Practice Providers (APPs -  Physician Assistants and Nurse Practitioners) who all work together to provide you with the care you need, when you need it.  We recommend signing up for the patient portal called "MyChart".  Sign up information is provided on this After Visit Summary.  MyChart is used to connect with patients for Virtual Visits (Telemedicine).  Patients are able to view lab/test results, encounter notes, upcoming appointments, etc.  Non-urgent messages can be sent to your provider as well.   To learn more about what you can do with MyChart, go to [email or web address].    Your next appointment:    Already scheduled on 10/27/21 at 1:40 pm  The format for your next appointment:   In Person  Provider:   [name] Sable, MD   Other Instructions

## 2021-08-08 NOTE — Telephone Encounter (Signed)
Called patient and she stated that after taking her Metoprolol her HR is still between 104-110. She denies fever or sore throat. States that she does have some sinus allergies. Patient wanted to be seen. Scheduled her for a visit this afternoon at 3:40. Patient was grateful for the call.  Emily Moon C  P Cv [facility] [place] Triage (supporting [name] Sable, MD) 56 minutes ago (12:13 PM)   Hello. I've been tachycardic all night. It wasn't time for my metropolol  but I have just taken it, my blood sugar is fine.  I don't want to go to the hospital. My avg resting heart rate is 107 at the present. Any suggestions? I do think I need to be seen. I've been  Sweating a lot. I am not hypertensive.  Please advise.  Thank you, Emily Moon

## 2021-08-13 ENCOUNTER — Telehealth: Payer: Self-pay | Admitting: Cardiology

## 2021-08-13 NOTE — Telephone Encounter (Signed)
Pt c/o medication issue:  1. Name of Medication: metoprolol   2. How are you currently taking this medication (dosage and times per day)? 1.5 tablets daily   3. Are you having a reaction (difficulty breathing--STAT)? Unable to sleep, nauseated   4. What is your medication issue? Patient calling, states that since change of medication she has been feeling miserable.  States she has stopped taking the half tablet since she has been so sick.  Please call to discuss.  Patient scheduled to see Dr Garen Lah on 09/30.

## 2021-08-15 ENCOUNTER — Ambulatory Visit: Payer: PPO | Admitting: Cardiology

## 2021-08-18 ENCOUNTER — Telehealth: Payer: Self-pay

## 2021-08-18 ENCOUNTER — Ambulatory Visit: Payer: PPO | Admitting: Cardiology

## 2021-08-18 MED ORDER — METOPROLOL SUCCINATE ER 50 MG PO TB24: 50.0000 mg | ORAL_TABLET | Freq: Every day | ORAL | 3 refills | Status: DC

## 2021-08-18 NOTE — Telephone Encounter (Signed)
See duplicate telephone encounter from 08/18/21, and MyChart documentation as well. Closing encounter.

## 2021-08-18 NOTE — Telephone Encounter (Signed)
Changed prescription to reflect the following recommendations.  Okay to return to previous dose of metoprolol due to worsening symptoms.  Thank you    You routed conversation to [name] Sable, MD 3 days ago   [place], [place] Triage (supporting [name] Sable, MD) 5 days ago   The addition of the 1/2 a metropolo has elevated my heart rate. I cannot sleep. At all. nausea and constipation too.  I'm going to go back to one pill and see what happens. Just an FYI. I am feeling much worse. If you have a suggestion then please let me know.  Thanks, C :-)

## 2021-08-19 DIAGNOSIS — Z471 Aftercare following joint replacement surgery: Secondary | ICD-10-CM | POA: Diagnosis not present

## 2021-08-19 DIAGNOSIS — Z96641 Presence of right artificial hip joint: Secondary | ICD-10-CM | POA: Diagnosis not present

## 2021-08-21 ENCOUNTER — Other Ambulatory Visit (HOSPITAL_COMMUNITY): Payer: Self-pay | Admitting: Psychiatry

## 2021-08-21 NOTE — Progress Notes (Signed)
Cardiology Office Note:    Date:  08/22/2021   ID:  Emily Moon, DOB 06-26-1961, MRN [id number]  PCP:  Emily Koch, NP  [facility] Cardiologist:  Emily Sable, MD  [facility] Electrophysiologist:  None   Referring MD: Emily Koch, NP   Chief Complaint: Medication issue  History of Present Illness:    Emily Moon is a 60 y.o. female with a hx of asthma, bipolar disorder, OSA, diabetes, hyperlipidemia, GERD, former smoker who presents for follow-up  Echo 06/04/2021 showed the EF 60 to 65%.  She follows with pulmonary medicine for obstructive pulmonary disease.  Is followed by lipid clinic on [place].  History of allergy to statins.  Seen by Dr. Garen Lah 08/08/2021 for inappropriate sinus tach.  Toprol was previously started with improvement of symptoms.  Noted over the last few days heart rate has elevated to 125 at times.  Toprol was further increased to 75 mg daily.  Today, the patient reports he can't take the upper dose of Toprol. She said one of the side affects was tachycardia. Also felt constipated. Resting heart rate is 90. Can get up over 110bpm. She feels palpitations. No chest pain or shortness of breath. NO LLE, orthopnea, pnd. Ivabradine and EP were discussed. Has not had a heart monitor before.   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   History of kidney stones    Hypercholesterolemia    Multiple sclerosis (HCC)    weakness   Osteoporosis     osteoarthritis   Pneumonia    PONV (postoperative nausea and vomiting)    no problem after cataract surgery   Prediabetes 07/22/2018   Psoriasis    Shortness of breath dyspnea    wheezing   Sleep apnea 2012   sleep study / slight, no interventions   Urinary frequency    Weight gain 06/21/2014    Past Surgical History:  Procedure Laterality Date   CATARACT EXTRACTION W/PHACO Left 05/21/2015   Procedure: CATARACT EXTRACTION PHACO AND INTRAOCULAR LENS PLACEMENT ([place]);  Surgeon: Birder Robson, MD;  Location: [facility] ORS;  Service: Ophthalmology;  Laterality: Left;  [place] 00:35 AP% 22.9 CDE 8.11 fluid pack lot #[id number]   CATARACT EXTRACTION W/PHACO Right 06/04/2015   Procedure: CATARACT EXTRACTION PHACO AND INTRAOCULAR LENS PLACEMENT ([facility]);  Surgeon: Birder Robson, MD;  Location: [facility] ORS;  Service: Ophthalmology;  Laterality: Right;  US:00:48 AP%: 10.5 CDE:5.08 Fluid lot #[id number]   CYSTOSCOPY/URETEROSCOPY/HOLMIUM LASER/STENT PLACEMENT Bilateral 09/22/2016   Procedure: CYSTOSCOPY/URETEROSCOPY/HOLMIUM LASER/STENT PLACEMENT;  Surgeon: Hollice Espy, MD;  Location: [facility] ORS;  Service: Urology;  Laterality: Bilateral;   EYE SURGERY  2015   tissue biopsy   FOOT SURGERY  2015   JOINT REPLACEMENT Left 2013   hip replacement   LITHOTRIPSY     PTOSIS REPAIR Bilateral 02/18/2016   Procedure: BILATERAL PTOSIS REPAIR UPPER EYELIDS;  Surgeon: Karle Starch, MD;  Location: [facility];  Service: Ophthalmology;  Laterality: Bilateral;  LEAVE PT EARLY AM   thumb surgery Right    TONSILLECTOMY  1973   TOTAL HIP ARTHROPLASTY Right 07/02/2021   Procedure: TOTAL HIP ARTHROPLASTY ANTERIOR APPROACH;  Surgeon: Gaynelle Arabian, MD;  Location: [facility];  Service: Orthopedics;  Laterality: Right;    Current Medications: Current Meds  Medication Sig   cetirizine (ZYRTEC) 10 MG tablet Take 10 mg by mouth daily.   ciclopirox (LOPROX) 0.77 % cream Apply topically 2 (two) times daily.   clindamycin  (CLEOCIN T) 1 % external solution Apply 1-2 times daily to scalp as needed for itch.   clobetasol (OLUX) 0.05 % topical foam Apply to affected areas at arms, legs, trunk twice a day up to 2 weeks. Avoid applying to face, groin, and axilla. Use as directed. Risk of skin atrophy with long-term use reviewed.   clobetasol (TEMOVATE) 0.05 % external solution Mix clobetasol solution with  CeraVe cream Use twice daily to affected areas.Avoid Face, groin and underarm   Continuous Blood Gluc Sensor (FREESTYLE LIBRE 14 DAY SENSOR) MISC PLACE INTO SKIN FOR CONTINUOUS CHECK OF BLOOD SUGAR DAILY   cyanocobalamin (,VITAMIN B-12,) 1000 MCG/ML injection Inject 1 mL (1,000 mcg total) into the muscle every 30 (thirty) days. For B12 vitamin   desipramine (NORPRAMIN) 25 MG tablet Take 1 tablet (25 mg total) by mouth daily.   diclofenac sodium (VOLTAREN) 1 % GEL Apply 4 g topically 4 (four) times daily.   Dulaglutide (TRULICITY) 1.5 ZJ/6.9CV SOPN Inject 1.5 mg into the skin once a week. For diabetes.   DULoxetine (CYMBALTA) 20 MG capsule Take 2 capsules (40 mg total) by mouth daily.   Evolocumab (REPATHA SURECLICK) 893 MG/ML SOAJ Inject 1 Dose into the skin every 14 (fourteen) days.   famotidine (PEPCID) 20 MG tablet Take 1 tablet (20 mg total) by mouth 2 (two) times daily.   fenofibrate (TRICOR) 145 MG tablet Take 1 tablet (145 mg total) by mouth daily.   fluocinonide (LIDEX) 0.05 % external solution Apply 1 application topically 2 (two) times daily as needed. Apply to scalp twice daily as need for itch   hydrocortisone 2.5 % cream Apply 1 application topically 2 (two) times daily as needed (underarm/groin rash).   hydroquinone 4 % cream Apply 1 application topically daily as needed (blemishes).   hydrOXYzine (VISTARIL) 50 MG capsule TAKE 1 CAPSLE BY MOUTH AT NOON, 1 AT 6 PM, AND 1 AS NEEDED   lamoTRIgine (LAMICTAL) 150 MG tablet Take 2 tablets (300 mg total) by mouth at bedtime.   levalbuterol (XOPENEX HFA) 45 MCG/ACT  inhaler INHALE 1 TO 2 PUFFS BY MOUTH EVERY 6 HOURS AS NEEDED FOR WHEEZE   meclizine (ANTIVERT) 25 MG tablet Take 0.5-1 tablets (12.5-25 mg total) by mouth 3 (three) times daily as needed for dizziness (sedation caution).   methocarbamol (ROBAXIN) 500 MG tablet Take 1-2 tablets (500-1,000 mg total) by mouth at bedtime as needed for muscle spasms.   metoprolol tartrate (LOPRESSOR) 50 MG tablet Take 1 tablet (50 mg total) by mouth 2 (two) times daily.   neomycin-bacitracin-polymyxin (NEOSPORIN) [phone] ointment Apply 1 application topically 4 (four) times daily as needed (for cut/scrapes.).   pantoprazole (PROTONIX) 40 MG tablet Take 1 tablet (40 mg total) by mouth daily. For heartburn   phenazopyridine (PYRIDIUM) 95 MG tablet Take 95 mg by mouth 3 (three) times daily as needed for pain.   polyethylene glycol powder (GLYCOLAX/MIRALAX) powder MIX 17 GRAMS (1 CAPFUL) WITH  4-8 OZ OF LIQUID AND TAKE BY MOUTH TWICE DAILY AS NEEDED   polyvinyl alcohol (LIQUIFILM TEARS) 1.4 % ophthalmic solution Place 2 drops into both eyes at bedtime.   SYRINGE/NEEDLE, DISP, 1 ML 23G X 1" 1 ML MISC Use as directed   temazepam (RESTORIL) 30 MG capsule TAKE ONE TO TWO CAPSULES BY MOUTH NIGHTLY AT BEDTIME   traZODone (DESYREL) 50 MG tablet Take 2 tablets (100 mg total) by mouth at bedtime.   valACYclovir (VALTREX) 1000 MG tablet TAKE 1 TABLET (1,000 MG TOTAL) BY MOUTH DAILY. FOR HERPES OUTBREAK PREVENTION   [DISCONTINUED] metoprolol succinate (TOPROL-XL) 50 MG 24 hr tablet Take 1 tablet (50 mg total) by mouth daily. Take with or immediately following a meal.     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  Tobacco Use   Smoking status: Former    Packs/day: 1.00    Years: 25.00    Pack years: 25.00    Types: Cigarettes    Quit date: 2019    Years since quitting: 3.7   Smokeless tobacco: Never   Tobacco comments:    occasional use  Vaping Use   Vaping Use: Former  Substance and Sexual Activity   Alcohol use: No    Alcohol/week: 0.0 standard drinks   Drug use: Yes    Types: Methylphenidate, Marijuana    Comment: Delta 8 - once per day   Sexual activity: Never  Other Topics Concern   Not on file  Social History Narrative   Not on file   Social Determinants of Health   Financial Resource Strain: Low Risk    Difficulty of Paying Living Expenses: Not hard at all  Food Insecurity: No Food Insecurity   Worried About Charity fundraiser in the Last Year: Never true   [place] in the Last Year: Never true  Transportation Needs: No Transportation Needs   Lack of Transportation (Medical): No   Lack of Transportation (Non-Medical): No  Physical Activity: Inactive   Days of Exercise per Week: 0 days   Minutes of Exercise per Session: 0 min  Stress: No Stress Concern Present   Feeling of Stress : Not at all  Social Connections: Not on file     Family History: The patient's family history includes Cancer in her father and mother; Heart disease in her mother. There is no history of Kidney disease, Bladder Cancer, Prostate cancer, or Kidney cancer.  ROS:   Please see the history of present illness.     All other systems reviewed and are negative.  EKGs/Labs/Other Studies Reviewed:    The following studies were reviewed today:  Echo 06/04/2021  1. Left ventricular ejection fraction, by estimation, is 60 to 65%. The  left ventricle has normal function. The left ventricle has no regional  wall motion abnormalities. Left  ventricular diastolic parameters are  consistent with Grade I diastolic  dysfunction (impaired relaxation).   2. Right ventricular systolic function is normal. The right ventricular  size is normal. Tricuspid regurgitation signal  is inadequate for assessing  PA pressure.   3. Left atrial size was mildly dilated.   EKG:  EKG is  ordered today.  The ekg ordered today demonstrates NSR, 97bpm, Rad, nonspecific T wave changes.   Recent Labs: 06/23/2021: ALT 44 07/03/2021: BUN 18; Creatinine, Ser 0.94; Hemoglobin 10.4; Platelets 384; Potassium 3.6; Sodium 139  Recent Lipid Panel    Component Value Date/Time   CHOL 159 03/10/2021 1628   TRIG 189.0 (H) 03/10/2021 1628   HDL 39.90 03/10/2021 1628   CHOLHDL 4 03/10/2021 1628   VLDL 37.8 03/10/2021 1628   LDLCALC 81 03/10/2021 1628   LDLDIRECT 53.0 08/01/2020 1254     Physical Exam:    VS:  BP 104/64 (BP Location: Left Arm, Patient Position: Sitting, Cuff Size: Normal)   Pulse 97   Ht 5\' 9"  (1.753 m)   Wt 235 lb (106.6 kg)   LMP 08/23/2014   SpO2 99%   BMI 34.70 kg/m     Wt Readings from Last 3 Encounters:  08/22/21 235 lb (106.6 kg)  08/08/21 232 lb (105.2 kg)  07/02/21 239 lb (108.4 kg)     GEN:  Well nourished, well developed in no acute distress HEENT: Normal NECK: No JVD; No carotid bruits LYMPHATICS: No lymphadenopathy CARDIAC: RRR, no murmurs, rubs, gallops RESPIRATORY:  Clear to auscultation without rales, wheezing or rhonchi  ABDOMEN: Soft, non-tender, non-distended MUSCULOSKELETAL:  No edema; No deformity  SKIN: Warm and dry NEUROLOGIC:  Alert and oriented x 3 PSYCHIATRIC:  Normal affect   ASSESSMENT:    1. Inappropriate sinus tachycardia   2. Dyslipidemia, goal LDL below 100    PLAN:    In order of problems listed above:  Inappropriate sinus tachycardia Patient reports resting heart rates in the 90s, can go up to 115 at times. She is on Toprol 50mg  daily. Says she was intolerant to the higher dose Toprol  75mg  daily. EKG today shows heart rate 97bpm. Says she feels her heart racing at times, can be worse at night. We will try Lopressor 50mg  BID to help control symptoms at night. I will also order a 2 week heart monitor. We will see her back after this.   Hyperlipidemia She is intolerant to statins. Continue Repatha and fenofibrate. LDL 81 02/2021  Disposition: Follow up in 2 month(s) with MD/APP    Signed, Laiah Pouncey Ninfa Meeker, PA-C  08/22/2021 3:31 PM    [facility]

## 2021-08-22 ENCOUNTER — Other Ambulatory Visit: Payer: Self-pay

## 2021-08-22 ENCOUNTER — Ambulatory Visit (INDEPENDENT_AMBULATORY_CARE_PROVIDER_SITE_OTHER): Payer: PPO

## 2021-08-22 ENCOUNTER — Ambulatory Visit (INDEPENDENT_AMBULATORY_CARE_PROVIDER_SITE_OTHER): Payer: PPO | Admitting: Medical

## 2021-08-22 ENCOUNTER — Encounter: Payer: Self-pay | Admitting: Medical

## 2021-08-22 VITALS — BP 104/64 | HR 97 | Ht 69.0 in | Wt 235.0 lb

## 2021-08-22 DIAGNOSIS — E785 Hyperlipidemia, unspecified: Secondary | ICD-10-CM

## 2021-08-22 DIAGNOSIS — R000 Tachycardia, unspecified: Secondary | ICD-10-CM | POA: Diagnosis not present

## 2021-08-22 MED ORDER — METOPROLOL TARTRATE 50 MG PO TABS: 50.0000 mg | ORAL_TABLET | Freq: Two times a day (BID) | ORAL | 3 refills | Status: DC

## 2021-08-22 NOTE — Patient Instructions (Addendum)
Medication Instructions:  Your physician has recommended you make the following change in your medication:   STOP Toprol XL  START Lopressor 50 mg taking 1 tablet twice a day    *If you need a refill on your cardiac medications before your next appointment, please call your pharmacy*   Lab Work: None ordered  If you have labs (blood work) drawn today and your tests are completely normal, you will receive your results only by: [place] (if you have MyChart) OR A paper copy in the mail If you have any lab test that is abnormal or we need to change your treatment, we will call you to review the results.   Testing/Procedures: [place] [place]- Long Term Monitor Instructions   Your physician has requested you wear your ZIO patch monitor 14 days.   This is a single patch monitor.  [employer] supplies one patch monitor per enrollment.  Additional stickers are not available.      Do not shower for the first 24 hours.  You may shower after the first 24 hours.   Press button if you feel a symptom. You will hear a small click.  Record Date, Time and Symptom in the Patient Log Book.   When you are ready to remove patch, follow instructions on last 2 pages of Patient Log Book.  Stick patch monitor onto last page of Patient Log Book.   Place Patient Log Book in [place] box.  Use locking tab on box and tape box closed securely.  The [employer] and [employer] has [employer] on it.  Please place in mailbox as soon as possible.  Your physician should have your test results approximately 7 days after the monitor has been mailed back to [facility].   Call [place] at [phone] if you have questions regarding your ZIO XT patch monitor.  Call them immediately if you see an orange light blinking on your monitor.   If your monitor falls off in less than 4 days contact our Monitor department at [phone].  If your monitor becomes loose or falls off after 4 days call [employer] at  [phone] for suggestions on securing your monitor.     Follow-Up: At [facility], you and your health needs are our priority.  As part of our continuing mission to provide you with exceptional heart care, we have created designated Provider Care Teams.  These Care Teams include your primary Cardiologist (physician) and Advanced Practice Providers (APPs -  Physician Assistants and Nurse Practitioners) who all work together to provide you with the care you need, when you need it.  We recommend signing up for the patient portal called "MyChart".  Sign up information is provided on this After Visit Summary.  MyChart is used to connect with patients for Virtual Visits (Telemedicine).  Patients are able to view lab/test results, encounter notes, upcoming appointments, etc.  Non-urgent messages can be sent to your provider as well.   To learn more about what you can do with MyChart, go to [email or web address].    Your next appointment:   As Scheduled  The format for your next appointment:   In Person  Provider:   [name] Sable, MD   Other Instructions

## 2021-08-27 DIAGNOSIS — G4733 Obstructive sleep apnea (adult) (pediatric): Secondary | ICD-10-CM | POA: Diagnosis not present

## 2021-08-27 NOTE — Addendum Note (Signed)
Addended by: Britt Bottom on: 08/27/2021 03:21 PM   Modules accepted: Orders

## 2021-08-29 ENCOUNTER — Other Ambulatory Visit: Payer: Self-pay | Admitting: Primary Care

## 2021-08-29 DIAGNOSIS — E538 Deficiency of other specified B group vitamins: Secondary | ICD-10-CM

## 2021-09-02 ENCOUNTER — Encounter: Payer: Self-pay | Admitting: Primary Care

## 2021-09-02 ENCOUNTER — Telehealth: Payer: Self-pay | Admitting: Primary Care

## 2021-09-02 ENCOUNTER — Telehealth: Payer: Self-pay

## 2021-09-02 ENCOUNTER — Other Ambulatory Visit: Payer: Self-pay

## 2021-09-02 ENCOUNTER — Telehealth (INDEPENDENT_AMBULATORY_CARE_PROVIDER_SITE_OTHER): Payer: PPO | Admitting: Primary Care

## 2021-09-02 VITALS — Ht 69.0 in | Wt 229.0 lb

## 2021-09-02 DIAGNOSIS — E119 Type 2 diabetes mellitus without complications: Secondary | ICD-10-CM | POA: Diagnosis not present

## 2021-09-02 DIAGNOSIS — R599 Enlarged lymph nodes, unspecified: Secondary | ICD-10-CM | POA: Insufficient documentation

## 2021-09-02 MED ORDER — TRULICITY 3 MG/0.5ML ~~LOC~~ SOAJ: 3.0000 mg | SUBCUTANEOUS | 0 refills | Status: DC

## 2021-09-02 NOTE — Telephone Encounter (Signed)
Emily Moon,  I discussed with patient. She will qualify for Trulicity or Ozempic based on her reported income. She mentioned she would prefer to take Ozempic over Trulicity based on your discussion today. What is your preference?  She would like to complete application at [facility]. Will send application to our office contact for patient to come in and sign.   Debbora Dus, PharmD Clinical Pharmacist [facility] [phone]

## 2021-09-02 NOTE — Patient Instructions (Signed)
We increased the dose of your Trucilty to 3 mg weekly. Please closely watch your sugars as discussed.   Schedule a lab appointment for one month at [facility].   It was a pleasure to see you today!

## 2021-09-02 NOTE — Telephone Encounter (Signed)
Sharyn Lull,  This patient is spending a lot of money out of pocket on Trulicity, does she qualify for patient assistance?  Thank you! Allie Bossier, NP-C

## 2021-09-02 NOTE — Progress Notes (Signed)
Patient ID: Emily Moon, female    DOB: 22-Nov-1960, 60 y.o.   MRN: [id number]  Virtual visit completed through [employer], a video enabled telemedicine application. Due to national recommendations of social distancing due to COVID-19, a virtual visit is felt to be most appropriate for this patient at this time. Reviewed limitations, risks, security and privacy concerns of performing a virtual visit and the availability of in person appointments. I also reviewed that there may be a patient responsible charge related to this service. The patient agreed to proceed.   Patient location: home Provider location: [place] at [facility], office Persons participating in this virtual visit: patient, provider   If any vitals were documented, they were collected by patient at home unless specified below.    Ht 5\' 9"  (1.753 m)   Wt 229 lb (103.9 kg)   LMP 08/23/2014   SpO2 98%   BMI 33.82 kg/m    CC: Discuss Diabetes and facial swelling. Subjective:   HPI: Emily Moon is a 60 y.o. female with a history of type 2 diabetes, multiple sclerosis, chronic pain, osteoarthritis of hips, s/p right total hip replacement presenting on 09/02/2021 for to discuss diabetes and facial swelling.  1) Type 2 Diabetes: Currently managed on Trulicity 1.5 mg weekly. A1C of 6.6 in mid August 2022. She's concerned that her diabetes is out of control.   She's checking her glucose numerous times daily. Over the last 2 months she's noticed higher readings of high 100's-mid 200's. Her average glucose reading is 142 over 7 days ago, 131 over the last 2 weeks. She's altered her sleeping pattern, is going to bed at 5 am, wakes at 2 pm the following day.   Just prior to to going to bed as she doesn't want her sugar to "drop". She has not been on steroids.   She is taking Trulicity 1.5 mg weekly for diabetes. She would like to increase her dose.   2) Swollen Lymph Node: Noted to right anterior/lateral neck for which  she noticed 2 weeks ago. Painful with radiation of pain to her right jaw. She denies fevers, dental pain. She has noticed some post nasal drip.       Relevant past medical, surgical, family and social history reviewed and updated as indicated. Interim medical history since our last visit reviewed. Allergies and medications reviewed and updated. Outpatient Medications Prior to Visit  Medication Sig Dispense Refill   Azelastine HCl 137 MCG/SPRAY SOLN PLACE 2 SPRAYS INTO BOTH NOSTRILS 2 (TWO) TIMES DAILY 30 mL 5   calcipotriene-betamethasone (TACLONEX) ointment Apply topically daily. 60 g 0   cetirizine (ZYRTEC) 10 MG tablet Take 10 mg by mouth daily.     ciclopirox (LOPROX) 0.77 % cream Apply topically 2 (two) times daily. 30 g 0   clindamycin (CLEOCIN T) 1 % external solution Apply 1-2 times daily to scalp as needed for itch. 60 mL 1   clobetasol (OLUX) 0.05 % topical foam Apply to affected areas at arms, legs, trunk twice a day up to 2 weeks. Avoid applying to face, groin, and axilla. Use as directed. Risk of skin atrophy with long-term use reviewed. 50 g 0   clobetasol (TEMOVATE) 0.05 % external solution Mix clobetasol solution with  CeraVe cream Use twice daily to affected areas.Avoid Face, groin and underarm 50 mL 0   Continuous Blood Gluc Sensor (FREESTYLE LIBRE 14 DAY SENSOR) MISC PLACE INTO SKIN FOR CONTINUOUS CHECK OF BLOOD SUGAR DAILY 6 each 2  cyanocobalamin (,VITAMIN B-12,) 1000 MCG/ML injection Inject 1 mL (1,000 mcg total) into the muscle every 30 (thirty) days. For B12 vitamin 3 mL 1   desipramine (NORPRAMIN) 25 MG tablet Take 1 tablet (25 mg total) by mouth daily. 30 tablet 6   diclofenac sodium (VOLTAREN) 1 % GEL Apply 4 g topically 4 (four) times daily. 500 g 5   DULoxetine (CYMBALTA) 20 MG capsule Take 2 capsules (40 mg total) by mouth daily. 180 capsule 2   Evolocumab (REPATHA SURECLICK) 824 MG/ML SOAJ Inject 1 Dose into the skin every 14 (fourteen) days. 2 mL 3   famotidine  (PEPCID) 20 MG tablet Take 1 tablet (20 mg total) by mouth 2 (two) times daily. 180 tablet 3   fenofibrate (TRICOR) 145 MG tablet Take 1 tablet (145 mg total) by mouth daily. 30 tablet 2   fluocinonide (LIDEX) 0.05 % external solution Apply 1 application topically 2 (two) times daily as needed. Apply to scalp twice daily as need for itch 180 mL 3   hydrocortisone 2.5 % cream Apply 1 application topically 2 (two) times daily as needed (underarm/groin rash).     hydrOXYzine (VISTARIL) 50 MG capsule TAKE 1 CAPSLE BY MOUTH AT NOON, 1 AT 6 PM, AND 1 AS NEEDED 270 capsule 2   lamoTRIgine (LAMICTAL) 150 MG tablet Take 2 tablets (300 mg total) by mouth at bedtime. 180 tablet 1   levalbuterol (XOPENEX HFA) 45 MCG/ACT inhaler INHALE 1 TO 2 PUFFS BY MOUTH EVERY 6 HOURS AS NEEDED FOR WHEEZE 1 each 0   meclizine (ANTIVERT) 25 MG tablet Take 0.5-1 tablets (12.5-25 mg total) by mouth 3 (three) times daily as needed for dizziness (sedation caution). 30 tablet 0   methocarbamol (ROBAXIN) 500 MG tablet Take 1-2 tablets (500-1,000 mg total) by mouth at bedtime as needed for muscle spasms. 180 tablet 0   metoprolol tartrate (LOPRESSOR) 50 MG tablet Take 1 tablet (50 mg total) by mouth 2 (two) times daily. 180 tablet 3   neomycin-bacitracin-polymyxin (NEOSPORIN) [phone] ointment Apply 1 application topically 4 (four) times daily as needed (for cut/scrapes.).     pantoprazole (PROTONIX) 40 MG tablet Take 1 tablet (40 mg total) by mouth daily. For heartburn 90 tablet 3   phenazopyridine (PYRIDIUM) 95 MG tablet Take 95 mg by mouth 3 (three) times daily as needed for pain.     polyethylene glycol powder (GLYCOLAX/MIRALAX) powder MIX 17 GRAMS (1 CAPFUL) WITH 4-8 OZ OF LIQUID AND TAKE BY MOUTH TWICE DAILY AS NEEDED 527 g 0   polyvinyl alcohol (LIQUIFILM TEARS) 1.4 % ophthalmic solution Place 2 drops into both eyes at bedtime.     SYRINGE-NEEDLE, DISP, 3 ML (B-D 3CC LUER-LOK SYR 23GX1") 23G X 1" 3 ML MISC USE AS DIRECTED 6  each 0   temazepam (RESTORIL) 30 MG capsule TAKE ONE TO TWO CAPSULES BY MOUTH NIGHTLY AT BEDTIME 60 capsule 1   traZODone (DESYREL) 50 MG tablet Take 2 tablets (100 mg total) by mouth at bedtime. 60 tablet 7   valACYclovir (VALTREX) 1000 MG tablet TAKE 1 TABLET (1,000 MG TOTAL) BY MOUTH DAILY. FOR HERPES OUTBREAK PREVENTION 90 tablet 0   Dulaglutide (TRULICITY) 1.5 MP/5.3IR SOPN Inject 1.5 mg into the skin once a week. For diabetes. 6 mL 2   budesonide-formoterol (SYMBICORT) 80-4.5 MCG/ACT inhaler Inhale 2 puffs into the lungs 2 (two) times daily. (Patient not taking: Reported on 09/02/2021) 1 each 6   hydroquinone 4 % cream Apply 1 application topically daily as needed (blemishes).  3   No facility-administered medications prior to visit.     Per HPI unless specifically indicated in ROS section below Review of Systems  Respiratory:  Negative for shortness of breath.   Cardiovascular:  Negative for chest pain.  Neurological:  Negative for dizziness.  Hematological:  Positive for adenopathy.  Objective:  Ht 5\' 9"  (1.753 m)   Wt 229 lb (103.9 kg)   LMP 08/23/2014   SpO2 98%   BMI 33.82 kg/m   Wt Readings from Last 3 Encounters:  09/02/21 229 lb (103.9 kg)  08/22/21 235 lb (106.6 kg)  08/08/21 232 lb (105.2 kg)       Physical exam: Gen: alert, NAD, not ill appearing Pulm: speaks in complete sentences without increased work of breathing Psych: normal mood, normal thought content  Lymph: No obvious swelling noted during exam.     Results for orders placed or performed during the hospital encounter of 07/02/21  Hemoglobin A1c per protocol  Result Value Ref Range   Hgb A1c MFr Bld 6.6 (H) 4.8 - 5.6 %   Mean Plasma Glucose 142.72 mg/dL  Glucose, capillary  Result Value Ref Range   Glucose-Capillary 117 (H) 70 - 99 mg/dL  Glucose, capillary  Result Value Ref Range   Glucose-Capillary 178 (H) 70 - 99 mg/dL  CBC  Result Value Ref Range   WBC 11.8 (H) 4.0 - 10.5 K/uL   RBC  3.37 (L) 3.87 - 5.11 MIL/uL   Hemoglobin 10.4 (L) 12.0 - 15.0 g/dL   HCT 31.8 (L) 36.0 - 46.0 %   MCV 94.4 80.0 - 100.0 fL   MCH 30.9 26.0 - 34.0 pg   MCHC 32.7 30.0 - 36.0 g/dL   RDW 15.1 11.5 - 15.5 %   Platelets 384 150 - 400 K/uL   nRBC 0.0 0.0 - 0.2 %  Basic metabolic panel  Result Value Ref Range   Sodium 139 135 - 145 mmol/L   Potassium 3.6 3.5 - 5.1 mmol/L   Chloride 102 98 - 111 mmol/L   CO2 26 22 - 32 mmol/L   Glucose, Bld 147 (H) 70 - 99 mg/dL   BUN 18 6 - 20 mg/dL   Creatinine, Ser 0.94 0.44 - 1.00 mg/dL   Calcium 8.5 (L) 8.9 - 10.3 mg/dL   GFR, Estimated >60 >60 mL/min   Anion gap 11 5 - 15  Glucose, capillary  Result Value Ref Range   Glucose-Capillary 109 (H) 70 - 99 mg/dL  ABO/Rh  Result Value Ref Range   ABO/RH(D)      O POS Performed at [facility], [place] [address], [place], [place] [address]    *Note: Due to a large number of results and/or encounters for the requested time period, some results have not been displayed. A complete set of results can be found in Results Review.   Assessment & Plan:   Problem List Items Addressed This Visit       Endocrine   Type 2 diabetes mellitus without complication, without long-term current use of insulin ([place]) - Primary    A1C overall has been well controlled historically, increased over last 2-3 months.  Patient is very very worried regarding her increased readings. We discussed to work on her diet, start some walking.   Increase Trulicity to 3 mg weekly. She will closely watch her blood sugars.   Repeat A1c in 1 month.       Relevant Medications   Dulaglutide (TRULICITY) 3 KG/2.5KY SOPN  Other Relevant Orders   POCT glycosylated hemoglobin (Hb A1C)     Immune and Lymphatic   Swelling of lymph node    Acute, could be allergy induced with post nasal drip.  She doesn't appear acutely ill.  She would like to monitor.  She will update if no improvement and or if lymph node enlarges.          Meds ordered this encounter  Medications   Dulaglutide (TRULICITY) 3 MH/9.6QI SOPN    Sig: Inject 3 mg as directed once a week. For diabetes.    Dispense:  2 mL    Refill:  0    Order Specific Question:   Supervising Provider    Answer:   BEDSOLE, AMY E [[id number]]   Orders Placed This Encounter  Procedures   POCT glycosylated hemoglobin (Hb A1C)    Standing Status:   Future    Standing Expiration Date:   10/03/2021    I discussed the assessment and treatment plan with the patient. The patient was provided an opportunity to ask questions and all were answered. The patient agreed with the plan and demonstrated an understanding of the instructions. The patient was advised to call back or seek an in-person evaluation if the symptoms worsen or if the condition fails to improve as anticipated.  Follow up plan:  We increased the dose of your Trucilty to 3 mg weekly. Please closely watch your sugars as discussed.   Schedule a lab appointment for one month at [facility].   It was a pleasure to see you today!   [name], NP

## 2021-09-02 NOTE — Telephone Encounter (Signed)
Trulicity 3 mg is what we discussed today, this is what we will proceed with.   Thank you SO MUCH!

## 2021-09-02 NOTE — Progress Notes (Signed)
Chronic Care Management Pharmacy Assistant   Name: Emily Moon  MRN: [id number] DOB: 08-22-1961  Reason for Encounter: CCM (Initial Questions)   Recent office visits:  09/02/2021 - [name], NP - Video Visit - Patient presented for facial swelling. Labs: A1c. Stop: Dulaglutide (TRULICITY) 1.5 NW/2.9FA SOPN and hydroquinone 4 % cream. Start: Dulaglutide (TRULICITY) 3 OZ/3.0QM SOPN.  06/04/2021 - [name], NP - Patient presented for annual exam. Labs:A1c, Cytology - PAP. Change: cyanocobalamin (,VITAMIN B-12,) 1000 MCG/ML injection, famotidine (PEPCID) 20 MG tablet and pantoprazole (PROTONIX) 40 MG tablet. 06/03/2021 - Andrez Grime, LPN - Patient presented for Annual Wellness Visit.  05/08/2021 - Owens Loffler, MD - Patient presented for psoriatic arthritis. Referral to Rheumatology  04/23/2021 - [name], NP - Patient presented for Pre-op clearance. Start: valACYclovir (VALTREX) 1000 MG tablet. 04/10/2021 - [name], NP - Patient Message - Change: Dulaglutide (TRULICITY) 1.5 VH/8.4ON SOPN 04/01/2021 - [name], NP - Patient Message - Start: Valtrex 1000mg .  03/26/2021 - [name], NP - Patient message - Change: Double dose of 1.5mg  weekly of Trulicity.  03/10/2021 - Elsie Stain, MD - Patient presented for dizziness. Labs: Lipid, A1c, Microalbumin and Vit B12. Start: meclizine (ANTIVERT) 25 MG tablet.  Recent consult visits:  08/08/2021 - Cardiology - Patient presented for follow up inappropriate sinus tachycardia. Labs: EKG. Start: metoprolol tartrate (LOPRESSOR) 50 MG tablet. Stop: metoprolol succinate (TOPROL-XL) 50 MG 24 hr tablet. 08/08/2021 - Cardiology - Patient presented for follow up inappropriate sinus tachycardia. Labs: EKG. Change: metoprolol succinate (TOPROL-XL) 50 MG 24 hr tablet. Stop: oxyCODONE (OXY IR/ROXICODONE) 5 MG immediate release tablet as patient is not taking.  08/08/2021 - Cardiology - Patient Message - Return to  previous dose of Metoprolol due to worsening symptoms of elevated heart rate.  07/30/2021 - Behavioral Health - Video Visit for Mixed Bipolar. No other information.  07/17/2021 - [place] - No other information.  07/09/2021 - [place] - No other information.  07/05/2021 - [place] - No other information.  06/27/2021 - Pulmonary - Patient presented for COPD with Asthma. Labs: DG Chest 2 views No medication changes.  06/26/2021 - Cardiology - Patient presented for follow up inappropriate sinus tachycardia. Labs: Lipid and EKG. No medication changes.  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 06/05/2021 -  Cardiology - Patient presented for shortness of breath. Procedures: PR ECHO [employer].  05/23/2021 - [facility] - Patient presented for Unilateral primary osteoarthritis.  05/14/2021 - Ophthalmology - Patient presented for Diabetic eye exam  04/18/2021 - Cardiology - Patient presented for pre-operative cardiovascular exam. Labs: Echocardiogram and EKG.  04/02/2021 - Orthopedic Surgery - Patient presented for pain in right hip Procedures: CHG RADEX HIP UNILATERAL WITH PELVIS 2-3 VIEWS. 03/27/2021 - Dermatology - Patient presented for rash. Start: clindamycin (CLEOCIN T) 1 % external solution, clobetasol (TEMOVATE) 0.05 % external solution and clobetasol (OLUX) 0.05 % topical foam. 03/06/2021 - Podiatry - Patient presented for at risk foot care. Diabetic Foot Exam performed.   Hospital visits:  Medication Reconciliation was completed by comparing discharge summary, patient's EMR and Pharmacy list, and upon discussion with patient.  Admitted to the hospital on 07/02/2021 due to Osteoarthritis of  hip. Total hip arthroplasty anterior approach. Discharge date was 07/03/2021. Discharged from [facility]  [place]?Medications Started at [facility] Discharge:?? -started oxyCODONE (OXY IR/ROXICODONE) 5 MG immediate release tablet -started rivaroxaban (XARELTO) 10 MG TABS tablet  Medication Changes at Hospital Discharge: -Changed methocarbamol (ROBAXIN) 500 MG tablet - Take 1 tablet (500 mg total) by mouth every 6 (six) hours as needed for muscle spasms., Starting Thu 07/03/2021, Until Fri 07/25/2021  Medications Discontinued at Hospital Discharge: -Stopped Aspirin EC 81 mg -Stopped Biotin PO -Stopped Lysine 1000mg  Tabs -Stopped Magnesium 500 mg Tabs -Stopped Vitamin D3 254mcg Capsule   Medications that remain the same after Hospital Discharge:??  -All other medications will remain the same.    Medications: Outpatient Encounter Medications as of 09/02/2021  Medication Sig   Azelastine HCl 137 MCG/SPRAY SOLN PLACE 2 SPRAYS INTO BOTH NOSTRILS 2 (TWO) TIMES DAILY   budesonide-formoterol (SYMBICORT) 80-4.5 MCG/ACT inhaler Inhale 2 puffs into the lungs 2 (two) times daily. (Patient not taking: Reported on 09/02/2021)   calcipotriene-betamethasone (TACLONEX) ointment Apply topically daily.   cetirizine (ZYRTEC) 10 MG tablet Take 10 mg by mouth daily.   ciclopirox (LOPROX) 0.77 % cream Apply topically 2 (two) times daily.   clindamycin (CLEOCIN T) 1 % external solution Apply 1-2 times daily to scalp as needed for itch.   clobetasol (OLUX) 0.05 % topical foam Apply to affected areas at arms, legs, trunk twice a day up to 2 weeks. Avoid applying to face, groin, and axilla. Use as directed. Risk of skin atrophy with long-term use reviewed.   clobetasol (TEMOVATE) 0.05 % external solution Mix clobetasol solution with  CeraVe cream Use twice daily to affected areas.Avoid Face, groin and underarm   Continuous Blood Gluc Sensor (FREESTYLE LIBRE 14 DAY SENSOR) MISC PLACE INTO SKIN FOR CONTINUOUS  CHECK OF BLOOD SUGAR DAILY   cyanocobalamin (,VITAMIN B-12,) 1000 MCG/ML injection Inject 1 mL (1,000 mcg total) into the muscle every 30 (thirty) days. For B12 vitamin   desipramine (NORPRAMIN) 25 MG tablet Take 1 tablet (25 mg total) by mouth daily.   diclofenac sodium (VOLTAREN) 1 % GEL Apply 4 g topically 4 (four) times daily.   Dulaglutide (TRULICITY) 3 RX/5.4MG SOPN Inject 3 mg as directed once a week. For diabetes.   DULoxetine (CYMBALTA) 20 MG capsule Take 2 capsules (40 mg total) by mouth daily.   Evolocumab (REPATHA SURECLICK) 867 MG/ML SOAJ Inject 1 Dose into the skin every 14 (fourteen) days.   famotidine (PEPCID) 20 MG tablet Take 1 tablet (20 mg total) by mouth 2 (two) times daily.   fenofibrate (TRICOR) 145 MG tablet Take 1 tablet (145 mg total) by mouth daily.   fluocinonide (LIDEX) 0.05 % external solution Apply 1 application topically 2 (two) times daily as needed. Apply to scalp twice daily as need for itch   hydrocortisone 2.5 % cream Apply 1 application topically 2 (two) times daily as needed (underarm/groin rash).   hydrOXYzine (VISTARIL) 50 MG capsule TAKE 1 CAPSLE BY MOUTH AT NOON, 1 AT 6 PM, AND 1 AS NEEDED   lamoTRIgine (LAMICTAL) 150 MG tablet Take 2 tablets (300 mg total) by mouth at bedtime.   levalbuterol (XOPENEX HFA) 45 MCG/ACT inhaler INHALE 1 TO 2 PUFFS BY MOUTH EVERY 6 HOURS AS NEEDED FOR WHEEZE   meclizine (ANTIVERT) 25 MG tablet Take 0.5-1 tablets (12.5-25 mg total) by mouth 3 (three) times daily as needed for dizziness (sedation caution).   methocarbamol (ROBAXIN) 500 MG tablet Take 1-2 tablets (500-1,000 mg total) by mouth at bedtime as needed for muscle spasms.   metoprolol  tartrate (LOPRESSOR) 50 MG tablet Take 1 tablet (50 mg total) by mouth 2 (two) times daily.   neomycin-bacitracin-polymyxin (NEOSPORIN) [phone] ointment Apply 1 application topically 4 (four) times daily as needed (for cut/scrapes.).   pantoprazole (PROTONIX) 40 MG tablet Take 1 tablet  (40 mg total) by mouth daily. For heartburn   phenazopyridine (PYRIDIUM) 95 MG tablet Take 95 mg by mouth 3 (three) times daily as needed for pain.   polyethylene glycol powder (GLYCOLAX/MIRALAX) powder MIX 17 GRAMS (1 CAPFUL) WITH 4-8 OZ OF LIQUID AND TAKE BY MOUTH TWICE DAILY AS NEEDED   polyvinyl alcohol (LIQUIFILM TEARS) 1.4 % ophthalmic solution Place 2 drops into both eyes at bedtime.   SYRINGE-NEEDLE, DISP, 3 ML (B-D 3CC LUER-LOK SYR 23GX1") 23G X 1" 3 ML MISC USE AS DIRECTED   temazepam (RESTORIL) 30 MG capsule TAKE ONE TO TWO CAPSULES BY MOUTH NIGHTLY AT BEDTIME   traZODone (DESYREL) 50 MG tablet Take 2 tablets (100 mg total) by mouth at bedtime.   valACYclovir (VALTREX) 1000 MG tablet TAKE 1 TABLET (1,000 MG TOTAL) BY MOUTH DAILY. FOR HERPES OUTBREAK PREVENTION   [DISCONTINUED] metoprolol succinate (TOPROL-XL) 50 MG 24 hr tablet Take 1 tablet (50 mg total) by mouth daily. Take with or immediately following a meal.   No facility-administered encounter medications on file as of 09/02/2021.    Lab Results  Component Value Date/Time   HGBA1C 6.6 (H) 07/02/2021 01:08 PM   HGBA1C 6.3 (A) 06/04/2021 02:45 PM   HGBA1C 6.9 (H) 03/10/2021 04:28 PM   MICROALBUR 1.0 03/10/2021 04:28 PM     BP Readings from Last 3 Encounters:  08/22/21 104/64  08/08/21 118/64  07/03/21 125/62    Patient contacted to review initial questions prior to visit with Charlene Brooke.  Have you seen any other providers since your last visit with PCP? No  Any changes in your medications or health? Yes  Any side effects from any medications? Yes - Patient states that every medication she is given and has been given makes her Herpes flare up so she has stopped all breathing medication.   Do you have an symptoms or problems not managed by your medications? No  Any concerns about your health right now? Patient states she is allergic to the adhesive of her Zio Montior. Her skin is red and she is unsure how she  is going to wear it for 12 more days. Patient was prescribed Trulicity 3mg  and feels that is way too high. Patient wants to try Ozempic instead and would like patient assistance with that. She also informed me she is not taking anything for her breathing. She has stopped everything prescribed as it makes her Herpes flare up. She would like to know if there is anything else she can try.   Has your provider asked that you check blood pressure, blood sugar, or follow special diet at home? Patient checks her glucose at home (patient said 100 times a day).  Do you get any type of exercise on a regular basis? She walks around her apartment and goes to runs errands with her friends.    Can you think of a goal you would like to reach for your health? Patient would like to lose 35 lbs at least and she would like to do things without gasping for air.   Do you have any problems getting your medications? Yes - Trulicity is too expensive. Patient would like to try Ozempic anyway - patient will need assistance with Ozempic as  well.   Is there anything that you would like to discuss during the appointment? Yes - Patient states she is allergic to the adhesive of her Zio Monitor. Her skin is red and she is unsure how she is going to wear it for 12 more days. Patient was prescribed Trulicity 3mg  and feels that is way to high. Patient wants to try Ozempic instead and would like patient assistance with that. She also informed me she is not taking anything for her breathing. She has stopped everything prescribed as it makes her Herpes flare up. She would like to know if there is anything else she can try.   Patient got her Covid and Flu Vaccine today 09/03/21 at [facility].  Patient would like to stay with Debbora Dus as they have an established relationship.   Spoke with patient and reminded them to have all medications, supplements and any blood glucose and blood pressure readings available for review with pharmacist, at  their telephone visit on 09/10/2021 at 2:00 pm.   Star Rating Drugs:  Medication:  Last Fill: Day Supply Trulicity 3mg /0.97mL 07/04/2021 84  Care Gaps: Annual wellness visit in last year? Yes Most Recent BP reading: 104/64 on 08/22/2021  If Diabetic: Most recent A1C reading: 6.6 on 07/02/2021 Last eye exam / retinopathy screening: Up to date Last diabetic foot exam: Up to date  Charlene Brooke, CPP notified  Marijean Niemann, [place] [phone]   Time Spent: 32 Minutes

## 2021-09-02 NOTE — Assessment & Plan Note (Signed)
Acute, could be allergy induced with post nasal drip.  She doesn't appear acutely ill.  She would like to monitor.  She will update if no improvement and or if lymph node enlarges.

## 2021-09-02 NOTE — Assessment & Plan Note (Signed)
A1C overall has been well controlled historically, increased over last 2-3 months.  Patient is very very worried regarding her increased readings. We discussed to work on her diet, start some walking.   Increase Trulicity to 3 mg weekly. She will closely watch her blood sugars.   Repeat A1c in 1 month.

## 2021-09-03 ENCOUNTER — Telehealth: Payer: Self-pay

## 2021-09-03 NOTE — Progress Notes (Signed)
Filled out forms for Trulicity PAP for the patient. Forms sent to pharmacist for review and signatures from provider and patient .  Debbora Dus, CPP notified  Avel Sensor, [place] Assistant [phone]  Total time spent for month CPA: 30 min

## 2021-09-04 NOTE — Telephone Encounter (Signed)
Pages faxed as requested

## 2021-09-04 NOTE — Telephone Encounter (Signed)
Spoke with patient today and let her know the forms are up front at [employer]. Patient will come in tomorrow and bring tax information. Beatriz Stallion printed this for me and has it up front. She will fax patient portion (pages 1-4) to manufacturer once signed.  Sending pages 5-6 (provider portion) to [place] to print and have Allie Bossier sign and fax to manufacturer.  Debbora Dus, PharmD Clinical Pharmacist [facility] [phone]

## 2021-09-08 ENCOUNTER — Other Ambulatory Visit: Payer: Self-pay

## 2021-09-08 ENCOUNTER — Ambulatory Visit (INDEPENDENT_AMBULATORY_CARE_PROVIDER_SITE_OTHER): Payer: PPO | Admitting: Family Medicine

## 2021-09-08 VITALS — BP 100/68 | HR 89 | Temp 98.8°F | Ht 69.0 in | Wt 239.2 lb

## 2021-09-08 DIAGNOSIS — R599 Enlarged lymph nodes, unspecified: Secondary | ICD-10-CM

## 2021-09-08 NOTE — Patient Instructions (Signed)
Swollen Lymph note - watch and wait - If worsening - increase in size, high fevers, or more sinus symptoms - call  - Should resolve within 4 weeks, likely shorter 2-3 weeks   Consider covid testing or flu testing  - if worsening body aches, nausea/vomiting, cough, breathing difficulty

## 2021-09-08 NOTE — Assessment & Plan Note (Signed)
Was seen virtually last week. Exam with scattered, tender, small lymph nodes. Suspect viral vs vaccine reaction. At this point does not have a lot of symptoms of sinus infection to suggest abx. Advised watch and wait and update if worsening symptoms. Reassurance

## 2021-09-08 NOTE — Progress Notes (Signed)
Subjective:     Emily Moon is a 60 y.o. female presenting for Lymphadenitis (On each side of neck x 1 week, with a low grade fever )     HPI  #Swollen lymph nodes - both sides - issues since she was a child - last episode was 25 years ago - painful to touch - low grade temperature - has MS - lymph nodes are pea-sized to prune-sized - taking Advil occasionally -   - got covid and flu shot last week    Review of Systems  Constitutional:  Negative for chills and fever.  HENT:  Positive for dental problem (pain), postnasal drip and trouble swallowing (at night, has to straighten her neck). Negative for congestion, sinus pressure, sinus pain and sore throat.   Eyes:  Negative for discharge and itching.  Respiratory:  Negative for cough, shortness of breath and wheezing.   Cardiovascular:  Negative for chest pain.  Gastrointestinal:  Negative for abdominal pain, diarrhea, nausea and vomiting.  Endocrine: Negative for cold intolerance and heat intolerance.  Musculoskeletal:  Positive for myalgias. Negative for arthralgias.    Social History   Tobacco Use  Smoking Status Former   Packs/day: 1.00   Years: 25.00   Pack years: 25.00   Types: Cigarettes   Quit date: 2019   Years since quitting: 3.8  Smokeless Tobacco Never  Tobacco Comments   occasional use        Objective:    BP Readings from Last 3 Encounters:  09/08/21 100/68  08/22/21 104/64  08/08/21 118/64   Wt Readings from Last 3 Encounters:  09/08/21 239 lb 4 oz (108.5 kg)  09/02/21 229 lb (103.9 kg)  08/22/21 235 lb (106.6 kg)    BP 100/68   Pulse 89   Temp 98.8 F (37.1 C) (Oral)   Ht 5\' 9"  (1.753 m)   Wt 239 lb 4 oz (108.5 kg)   LMP 08/23/2014   SpO2 96%   BMI 35.33 kg/m    Physical Exam Constitutional:      General: She is not in acute distress.    Appearance: She is well-developed. She is not diaphoretic.  HENT:     Head: Normocephalic and atraumatic.     Right Ear:  Tympanic membrane and external ear normal.     Left Ear: Tympanic membrane and external ear normal.     Nose: Nose normal. No congestion.     Mouth/Throat:     Mouth: Mucous membranes are dry.     Pharynx: No oropharyngeal exudate or posterior oropharyngeal erythema.  Eyes:     Extraocular Movements: Extraocular movements intact.     Conjunctiva/sclera: Conjunctivae normal.  Cardiovascular:     Rate and Rhythm: Normal rate.  Pulmonary:     Effort: Pulmonary effort is normal.  Musculoskeletal:     Cervical back: Neck supple.  Lymphadenopathy:     Cervical: Cervical adenopathy (shotty) present.  Skin:    General: Skin is warm and dry.     Capillary Refill: Capillary refill takes less than 2 seconds.  Neurological:     Mental Status: She is alert. Mental status is at baseline.  Psychiatric:        Mood and Affect: Mood normal.        Behavior: Behavior normal.          Assessment & Plan:   Problem List Items Addressed This Visit       Immune and Lymphatic   Swelling of  lymph node - Primary    Was seen virtually last week. Exam with scattered, tender, small lymph nodes. Suspect viral vs vaccine reaction. At this point does not have a lot of symptoms of sinus infection to suggest abx. Advised watch and wait and update if worsening symptoms. Reassurance      I spent >15 minutes with pt , obtaining history, examining, reviewing chart, documenting encounter and discussing the above plan of care.   Return if symptoms worsen or fail to improve.  Lesleigh Noe, MD  This visit occurred during the SARS-CoV-2 public health emergency.  Safety protocols were in place, including screening questions prior to the visit, additional usage of staff PPE, and extensive cleaning of exam room while observing appropriate contact time as indicated for disinfecting solutions.

## 2021-09-09 ENCOUNTER — Other Ambulatory Visit: Payer: Self-pay | Admitting: Dermatology

## 2021-09-09 DIAGNOSIS — L308 Other specified dermatitis: Secondary | ICD-10-CM

## 2021-09-09 MED ORDER — CLOBETASOL PROPIONATE 0.05 % EX FOAM
CUTANEOUS | 0 refills | Status: DC
Start: 2021-09-09 — End: 2022-03-23

## 2021-09-10 ENCOUNTER — Ambulatory Visit (INDEPENDENT_AMBULATORY_CARE_PROVIDER_SITE_OTHER): Payer: PPO | Admitting: Pharmacist

## 2021-09-10 ENCOUNTER — Other Ambulatory Visit: Payer: Self-pay

## 2021-09-10 DIAGNOSIS — M159 Polyosteoarthritis, unspecified: Secondary | ICD-10-CM

## 2021-09-10 DIAGNOSIS — F3170 Bipolar disorder, currently in remission, most recent episode unspecified: Secondary | ICD-10-CM

## 2021-09-10 DIAGNOSIS — G35 Multiple sclerosis: Secondary | ICD-10-CM

## 2021-09-10 DIAGNOSIS — E785 Hyperlipidemia, unspecified: Secondary | ICD-10-CM

## 2021-09-10 DIAGNOSIS — J449 Chronic obstructive pulmonary disease, unspecified: Secondary | ICD-10-CM

## 2021-09-10 DIAGNOSIS — L409 Psoriasis, unspecified: Secondary | ICD-10-CM

## 2021-09-10 DIAGNOSIS — K2100 Gastro-esophageal reflux disease with esophagitis, without bleeding: Secondary | ICD-10-CM

## 2021-09-10 DIAGNOSIS — E119 Type 2 diabetes mellitus without complications: Secondary | ICD-10-CM

## 2021-09-10 DIAGNOSIS — J4489 Other specified chronic obstructive pulmonary disease: Secondary | ICD-10-CM

## 2021-09-10 NOTE — Progress Notes (Signed)
Chronic Care Management Pharmacy Note  09/15/2021 Name:  Emily Moon MRN:  [id number] DOB:  09/11/1961  Summary: -Pt endorses significant fatigue, worsening over past weeks-months; counseled on potential contributors including deconditioning, poor sleep, MS, beta blocker, recent vaccines (Flu and covid booster 10/19), low B12 -Pt is not taking Symbicort due to it causing flares of oral herpes (per patient report); she may tolerate LAMA monotherapy better than ICS  Recommendations/Changes made from today's visit: -Recommended follow up with neurology to assess MS progression; pt was seeing a neurologist in [facility] and requests a referral to a neurologist at [place] [facility] closer to home -Advised to resume B12 injections monthly -Recommend trial of Spiriva Respimat 2.5 mcg - consulting with pulmonary   Subjective: Emily Moon is an 60 y.o. year old female who is a primary patient of [name], NP.  The CCM team was consulted for assistance with disease management and care coordination needs.    Engaged with patient by telephone for initial visit in response to provider referral for pharmacy case management and/or care coordination services.   Consent to Services:  The patient was given the following information about Chronic Care Management services today, agreed to services, and gave verbal consent: 1. CCM service includes personalized support from designated clinical staff supervised by the primary care provider, including individualized plan of care and coordination with other care providers 2. 24/7 contact phone numbers for assistance for urgent and routine care needs. 3. Service will only be billed when office clinical staff spend 20 minutes or more in a month to coordinate care. 4. Only one practitioner may furnish and bill the service in a calendar month. 5.The patient may stop CCM services at any time (effective at the end of the month) by phone call to the  office staff. 6. The patient will be responsible for cost sharing (co-pay) of up to 20% of the service fee (after annual deductible is met). Patient agreed to services and consent obtained.  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 She only leaves home for grocery trips and doctor appts.  Recent office visits: 09/02/2021 - [name], NP - Video Visit - Patient presented for facial swelling. Labs: A1c. Increase Trulicity to 3 mg/week.  06/04/2021 - [name], NP - Patient presented for annual exam. Labs:A1c, Cytology - PAP. Change: cyanocobalamin (,VITAMIN B-12,) 1000 MCG/ML injection, famotidine (PEPCID) 20 MG tablet and pantoprazole (PROTONIX) 40 MG tablet.  06/03/2021 - Andrez Grime, LPN - Patient presented for Annual Wellness Visit.   05/08/2021 - Owens Loffler, MD - Patient presented for psoriatic arthritis. Referral to Rheumatology   04/23/2021 - [name], NP - Patient presented for Pre-op clearance. Start: valACYclovir (VALTREX) 1000 MG tablet. 04/10/2021 - [name], NP - Patient Message - Change: Dulaglutide (TRULICITY) 1.5 JX/9.1YN SOPN 04/01/2021 - [name], NP - Patient Message - Start: Valtrex $RemoveBefore'1000mg'MUfXNBveFhqMe$ .  03/26/2021 - [name], NP - Patient message - Change: Double dose of 1.$RemoveB'5mg'ACOLFyNC$  weekly of Trulicity.  03/10/2021 - Elsie Stain, MD - Patient presented for dizziness. Labs: Lipid, A1c, Microalbumin and Vit B12. Start: meclizine (ANTIVERT) 25 MG tablet.  Recent consult visits: 08/22/21 PA Kathlen Mody (Cardiology): f/u tachycardia. D/c toprol and Start metoprolol tartrate 50  mg BID. Ordered 2-wk heart monitor  08/08/2021 - Dr  Garen Lah, Cardiology - Patient presented for follow up inappropriate sinus tachycardia. Labs: Increase metoprolol succinate to 75 mg daily. Returned to 50 mg dose shortly after due to worsening sx.  07/30/2021 - Behavioral Health - Video Visit for Mixed Bipolar, depression, insomnia. No med changes.  06/27/2021 - NP Parrett, Pulmonary - Patient presented for COPD with Asthma. Labs: DG Chest 2 views No medication changes.   06/26/2021 - Cardiology - Patient presented for follow up inappropriate sinus tachycardia. Labs: Lipid and EKG. No medication changes.   06/19/2021 - Dr Jefm Bryant, Rheumatology - referred to rule out psoriatic arthritis. No definite dx of psoriasis.   06/05/2021 -  Cardiology - Patient presented for shortness of breath. Procedures: PR ECHO [place].   05/23/2021 - [facility] - Patient presented for Unilateral primary osteoarthritis.   05/14/2021 - Ophthalmology - Patient presented for Diabetic eye exam   04/18/2021 - Cardiology - Patient presented for pre-operative cardiovascular exam. Labs: Echocardiogram and EKG.   04/02/2021 - Orthopedic Surgery - Patient presented for pain in right hip Procedures: CHG RADEX HIP UNILATERAL WITH PELVIS 2-3 VIEWS.  03/27/2021 - Dermatology - Patient presented for rash. Start: clindamycin (CLEOCIN T) 1 % external solution, clobetasol (TEMOVATE) 0.05 % external solution and clobetasol (OLUX) 0.05 % topical foam.  03/06/2021 - Podiatry - Patient presented for at risk foot care. Diabetic Foot Exam performed.   Hospital visits: Medication Reconciliation was completed by comparing discharge summary, patient's EMR and Pharmacy list, and upon discussion with patient.   Admitted to the hospital on 07/02/2021 due to Total hip arthroplasty anterior approach. Discharge date was 07/03/2021. Discharged from [facility]?Medications Started at [facility] Discharge:?? -started oxyCODONE (OXY  IR/ROXICODONE) 5 MG immediate release tablet -started rivaroxaban (XARELTO) 10 MG TABS tablet x 20 days, then aspirin 81 mg   Medication Changes at Hospital Discharge: -Changed methocarbamol (ROBAXIN) 500 MG tablet - Take 1 tablet (500 mg total) by mouth every 6 (six) hours as needed for muscle spasms., Starting Thu 07/03/2021, Until Fri 07/25/2021   Medications Discontinued at Hospital Discharge: -Stopped Aspirin EC 81 mg -Stopped Biotin PO -Stopped Lysine $RemoveBeforeD'1000mg'OoBwhaGcldYBsU$  Tabs -Stopped Magnesium 500 mg Tabs -Stopped Vitamin D3 212mcg Capsule    Medications that remain the same after Hospital Discharge:??  -All other medications will remain the same.    Objective:  Lab Results  Component Value Date   CREATININE 0.94 07/03/2021   BUN 18 07/03/2021   GFR 75.32 08/28/2020   GFRNONAA >60 07/03/2021   GFRAA >60 02/19/2020   NA 139 07/03/2021   K 3.6 07/03/2021   CALCIUM 8.5 (L) 07/03/2021   CO2 26 07/03/2021   GLUCOSE 147 (H) 07/03/2021    Lab Results  Component Value Date/Time   HGBA1C 6.6 (H) 07/02/2021 01:08 PM   HGBA1C 6.3 (A) 06/04/2021 02:45 PM   HGBA1C 6.9 (H) 03/10/2021 04:28 PM   GFR 75.32 08/28/2020 12:33 PM   GFR 63.54 01/22/2020 02:32 PM   MICROALBUR 1.0 03/10/2021 04:28 PM    Last diabetic Eye exam:  Lab Results  Component Value Date/Time   HMDIABEYEEXA No Retinopathy 05/14/2021 12:00 AM    Last diabetic Foot exam: No results found for: HMDIABFOOTEX   Lab Results  Component Value Date   CHOL 159 03/10/2021   HDL 39.90 03/10/2021   LDLCALC 81 03/10/2021   LDLDIRECT 53.0 08/01/2020   TRIG 189.0 (H) 03/10/2021   CHOLHDL  4 03/10/2021    Hepatic Function Latest Ref Rng & Units 06/23/2021 02/19/2020 01/22/2020  Total Protein 6.5 - 8.1 g/dL 7.7 7.6 6.9  Albumin 3.5 - 5.0 g/dL 4.8 4.5 4.4  AST 15 - 41 U/L 24 20 12   ALT 0 - 44 U/L 44 19 16  Alk Phosphatase 38 - 126 U/L 44 75 73  Total Bilirubin 0.3 - 1.2 mg/dL 0.5 0.8 0.4  Bilirubin, Direct 0.0 - 0.3 mg/dL - - -     Lab Results  Component Value Date/Time   TSH 0.902 02/19/2020 03:53 PM   TSH 1.31 05/10/2019 02:34 PM   TSH 4.32 11/24/2018 08:26 AM   FREET4 1.08 02/19/2020 03:53 PM   FREET4 0.82 07/25/2013 12:16 PM    CBC Latest Ref Rng & Units 07/03/2021 06/23/2021 02/19/2020  WBC 4.0 - 10.5 K/uL 11.8(H) 9.3 8.3  Hemoglobin 12.0 - 15.0 g/dL 10.4(L) 13.2 14.2  Hematocrit 36.0 - 46.0 % 31.8(L) 40.1 42.2  Platelets 150 - 400 K/uL 384 492(H) 352    Lab Results  Component Value Date/Time   VD25OH 75.66 01/22/2020 02:32 PM   VD25OH 61.68 08/15/2019 11:13 AM    Clinical ASCVD: No  The 10-year ASCVD risk score (Arnett DK, et al., 2019) is: 4.9%   Values used to calculate the score:     Age: 61 years     Sex: Female     Is Non-Hispanic African American: No     Diabetic: Yes     Tobacco smoker: No     Systolic Blood Pressure: 233 mmHg     Is BP treated: Yes     HDL Cholesterol: 39.9 mg/dL     Total Cholesterol: 159 mg/dL    Depression screen [facility] 2/9 06/03/2021 04/23/2021 04/05/2020  Decreased Interest 3 3 3   Down, Depressed, Hopeless 3 3 3   PHQ - 2 Score 6 6 6   Altered sleeping 3 3 0  Tired, decreased energy 3 3 0  Change in appetite 0 0 0  Feeling bad or failure about yourself  0 3 0  Trouble concentrating 1 0 0  Moving slowly or fidgety/restless 1 0 0  Suicidal thoughts 1 0 0  PHQ-9 Score 15 15 6   Difficult doing work/chores Somewhat difficult Very difficult Somewhat difficult  Some recent data might be hidden    No flowsheet data found.   Social History   Tobacco Use  Smoking Status Former   Packs/day: 1.00   Years: 25.00   Pack years: 25.00   Types: Cigarettes   Quit date: 2019   Years since quitting: 3.8  Smokeless Tobacco Never  Tobacco Comments   occasional use   BP Readings from Last 3 Encounters:  09/08/21 100/68  08/22/21 104/64  08/08/21 118/64   Pulse Readings from Last 3 Encounters:  09/08/21 89  08/22/21 97  08/08/21 (!) 106   Wt Readings from Last 3  Encounters:  09/08/21 239 lb 4 oz (108.5 kg)  09/02/21 229 lb (103.9 kg)  08/22/21 235 lb (106.6 kg)   BMI Readings from Last 3 Encounters:  09/08/21 35.33 kg/m  09/02/21 33.82 kg/m  08/22/21 34.70 kg/m    Assessment/Interventions: Review of patient past medical history, allergies, medications, health status, including review of consultants reports, laboratory and other test data, was performed as part of comprehensive evaluation and provision of chronic care management services.   SDOH:  (Social Determinants of Health) assessments and interventions performed: Yes  SDOH Screenings   Alcohol Screen: Low Risk  Last Alcohol Screening Score (AUDIT): 0  Depression (PHQ2-9): Medium Risk   PHQ-2 Score: 15  Financial Resource Strain: Low Risk    Difficulty of Paying Living Expenses: Not hard at all  Food Insecurity: No Food Insecurity   Worried About Programme researcher, broadcasting/film/video in the Last Year: Never true   Ran Out of Food in the Last Year: Never true  Housing: Low Risk    Last Housing Risk Score: 0  Physical Activity: Inactive   Days of Exercise per Week: 0 days   Minutes of Exercise per Session: 0 min  Social Connections: Not on file  Stress: No Stress Concern Present   Feeling of Stress : Not at all  Tobacco Use: Medium Risk   Smoking Tobacco Use: Former   Smokeless Tobacco Use: Never   Passive Exposure: Not on file  Transportation Needs: No Transportation Needs   Lack of Transportation (Medical): No   Lack of Transportation (Non-Medical): No    CCM Care Plan  Allergies  Allergen Reactions   Albuterol Shortness Of Breath and Other (See Comments)    Makes pt feel jittery/ tacycardic   Crestor [Rosuvastatin] Other (See Comments)    Joint pain, muscle pain, and hair loss   Halcion [Triazolam] Other (See Comments)    Dizziness,headaches,bladder problems   Levaquin [Levofloxacin In D5w] Diarrhea and Itching    Shoulder pain   Naproxen Sodium Swelling    Patient tolerates  in small doses   Tylenol [Acetaminophen] Swelling    Patient tolerates in small doses   Cefaclor Other (See Comments)    Doesn't remember---unsure if actually allergic    Sulfa Antibiotics Itching    Unsure of reaction possibly itching   Tramadol Itching and Nausea And Vomiting   Zinc Other (See Comments)    constipation  constipation    Aripiprazole Other (See Comments)    Muscle tension/cramping   Diclofenac Sodium Rash    "made very sick"   Ibuprofen Swelling    Patient tolerates in small doses    Medications Reviewed Today     Reviewed by Kathyrn Sheriff, [facility] (Pharmacist) on 09/10/21 at 1603  Med List Status: <None>   Medication Order Taking? Sig Documenting Provider Last Dose Status Informant  Azelastine HCl 137 MCG/SPRAY SOLN [id number] Yes PLACE 2 SPRAYS INTO BOTH NOSTRILS 2 (TWO) TIMES DAILY Doreene Nest, NP Taking Active   calcipotriene-betamethasone [facility]) ointment [id number] Yes Apply topically daily. Doreene Nest, NP Taking Active   cetirizine (ZYRTEC) 10 MG tablet [id number] Yes Take 10 mg by mouth daily. [provider] Taking Active Self  clobetasol (OLUX) 0.05 % topical foam [id number] Yes Apply to affected areas at arms, legs, trunk twice a day up to 2 weeks. Avoid applying to face, groin, and axilla. Use as directed. Risk of skin atrophy with long-term use reviewed. Moye, [place], MD Taking Active   clobetasol (TEMOVATE) 0.05 % external solution [id number] Yes Mix clobetasol solution with  CeraVe cream Use twice daily to affected areas.Avoid Face, groin and underarm Moye, [place], MD Taking Active Self  Continuous Blood Gluc Sensor (FREESTYLE LIBRE 14 DAY SENSOR) [place] [id number] Yes PLACE INTO SKIN FOR CONTINUOUS CHECK OF BLOOD SUGAR DAILY Doreene Nest, NP Taking Active Self  cyanocobalamin (,VITAMIN B-12,) 1000 MCG/ML injection [id number] No Inject 1 mL (1,000 mcg total) into the muscle every 30 (thirty) days. For B12 vitamin  Patient  not taking: Reported on 09/10/2021   Doreene Nest, NP Not Taking Active Self  desipramine (NORPRAMIN) 25 MG tablet [id number] Yes Take 1 tablet (25 mg total) by mouth daily. Norma Fredrickson, MD Taking Active   Dulaglutide (TRULICITY) 3 WC/3.7SE SOPN [id number] Yes Inject 3 mg as directed once a week. For diabetes. [name], NP Taking Active   DULoxetine (CYMBALTA) 20 MG capsule [id number] Yes Take 2 capsules (40 mg total) by mouth daily. Norma Fredrickson, MD Taking Active   Evolocumab [facility] SURECLICK) 948 MG/ML Darden Palmer [id number] Yes Inject 1 Dose into the skin every 14 (fourteen) days. [name] Sable, MD Taking Active Self  famotidine (PEPCID) 20 MG tablet [id number] Yes Take 1 tablet (20 mg total) by mouth 2 (two) times daily. [name], NP Taking Active Self  fenofibrate (TRICOR) 145 MG tablet [id number] Yes Take 1 tablet (145 mg total) by mouth daily. [name] Sable, MD Taking Active   fluocinonide (LIDEX) 0.05 % external solution [id number] Yes Apply 1 application topically 2 (two) times daily as needed. Apply to scalp twice daily as need for itch Moye, [place], MD Taking Active Self  hydrocortisone 2.5 % cream [id number] Yes Apply 1 application topically 2 (two) times daily as needed (underarm/groin rash). [provider] Taking Active Self  hydrOXYzine (VISTARIL) 50 MG capsule [id number] Yes TAKE 1 CAPSLE BY MOUTH AT NOON, 1 AT 6 PM, AND 1 AS NEEDED  Patient taking differently: Take 100 mg by mouth at bedtime. TAKE 1 CAPSLE BY MOUTH AT NOON, 1 AT 6 PM, AND 1 AS NEEDED   Plovsky, Gerald, MD Taking Active   lamoTRIgine (LAMICTAL) 150 MG tablet [id number] Yes Take 2 tablets (300 mg total) by mouth at bedtime. Norma Fredrickson, MD Taking Active   levalbuterol [facility] HFA) 45 MCG/ACT inhaler [id number] Yes INHALE 1 TO 2 PUFFS BY MOUTH EVERY 6 HOURS AS NEEDED FOR WHEEZE [name], NP Taking Active Self  meclizine (ANTIVERT) 25 MG tablet [id number] Yes Take 0.5-1  tablets (12.5-25 mg total) by mouth 3 (three) times daily as needed for dizziness (sedation caution). Tonia Ghent, MD Taking Active Self  methocarbamol (ROBAXIN) 500 MG tablet [id number] Yes Take 1-2 tablets (500-1,000 mg total) by mouth at bedtime as needed for muscle spasms. [name], NP Taking Active   metoprolol tartrate (LOPRESSOR) 50 MG tablet [id number] Yes Take 1 tablet (50 mg total) by mouth 2 (two) times daily. Furth, Cadence H, PA-C Taking Active   neomycin-bacitracin-polymyxin (NEOSPORIN) [phone] ointment [id number] Yes Apply 1 application topically 4 (four) times daily as needed (for cut/scrapes.). [provider] Taking Active Self           Med Note Earna Coder Mar 10, 2021  3:56 PM)    pantoprazole (PROTONIX) 40 MG tablet [id number] Yes Take 1 tablet (40 mg total) by mouth daily. For heartburn [name], NP Taking Active Self  polyethylene glycol powder (GLYCOLAX/MIRALAX) powder [id number] Yes MIX 17 GRAMS (1 CAPFUL) WITH 4-8 OZ OF LIQUID AND TAKE BY MOUTH TWICE DAILY AS NEEDED [name], NP Taking Active   polyvinyl alcohol (LIQUIFILM TEARS) 1.4 % ophthalmic solution [id number] Yes Place 2 drops into both eyes at bedtime. [provider] Taking Active Self  temazepam (RESTORIL) 30 MG capsule [id number] Yes TAKE ONE TO TWO CAPSULES BY MOUTH NIGHTLY AT BEDTIME Plovsky, Berneta Sages, MD Taking Active   traZODone (DESYREL) 50 MG tablet [id number] Yes Take 2 tablets (100 mg total) by mouth at bedtime. Norma Fredrickson, MD Taking Active   valACYclovir (VALTREX) 1000 MG tablet [id number] Yes TAKE 1  TABLET (1,000 MG TOTAL) BY MOUTH DAILY. FOR HERPES OUTBREAK PREVENTION  Patient taking differently: Take 1,000 mg by mouth daily as needed. For herpes outbreak prevention   [name], NP Taking Active             Patient Active Problem List   Diagnosis Date Noted   Swelling of lymph node 09/02/2021   Primary osteoarthritis of  right hip 07/02/2021   Preop pulmonary/respiratory exam 06/27/2021   Imbalance 12/10/2020   Type 2 diabetes mellitus without complication, without long-term current use of insulin ([place]) 08/28/2020   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   Psoriasis 11/02/2019   Rash and nonspecific skin eruption 02/20/2019   COPD with asthma ([place]) 02/10/2019   Epigastric pain 12/21/2018   OSA (obstructive sleep apnea) 12/02/2018   Joint swelling 10/24/2018   Environmental and seasonal allergies 10/24/2018   Greater trochanteric bursitis of right hip 09/28/2018   Bipolar I disorder, most recent episode (or current) manic ([place]) 05/20/2018   Vitamin D deficiency 02/15/2018   Preventative health care 11/24/2017   B12 deficiency 05/22/2016   Vertigo 03/16/2016   Medicare annual wellness visit, subsequent 10/25/2015   Chronic back pain 10/31/2014   Bipolar disorder ([place]) 05/21/2014   Pre-operative clearance 01/22/2014   Deformity of right foot 12/27/2013   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 09/15/2013   Insomnia 09/01/2011   Obesity 05/04/2011   HLD (hyperlipidemia) 01/20/2011   Chest pain 01/20/2011   Chronic pain syndrome 01/07/2011   RENAL CALCULUS, RECURRENT 11/13/2010   MULTIPLE SCLEROSIS, PROGRESSIVE/RELAPSING 08/28/2010   GERD 08/28/2010   OA (osteoarthritis) of hip 08/28/2010   NEPHROLITHIASIS, HX OF 08/28/2010    Immunization History  Administered Date(s) Administered   Influenza Split 08/05/2011   Influenza, Seasonal, Injecte, Preservative Fre 06/26/2018   Influenza,inj,Quad PF,6+ Mos 08/17/2013, 07/13/2015, 07/06/2017, 06/26/2018, 06/26/2019, 07/04/2020, 09/03/2021   Influenza-Unspecified 07/08/2016, 06/18/2017, 06/26/2018   PFIZER(Purple Top)SARS-COV-2 Vaccination 02/06/2020, 02/28/2020, 07/01/2020, 03/19/2021   Pfizer Covid-19 Vaccine Bivalent Booster 13yrs & up 09/03/2021   Pneumococcal Conjugate-13 07/05/2014   Pneumococcal Polysaccharide-23  07/13/2015    Conditions to be addressed/monitored:  Hyperlipidemia, Diabetes, COPD, and Asthma, Tachycardia, Bipolar Disorder, Insomnia, GERD  Care Plan : CCM Pharmacy Care Plan  Updates made by [name], [place] since 09/15/2021 12:00 AM     Problem: Hyperlipidemia, Diabetes, COPD, and Asthma, Tachycardia, Bipolar Disorder, Insomnia, GERD   Priority: High     Long-Range Goal: Disease management   Start Date: 09/15/2021  Expected End Date: 09/15/2022  This Visit's Progress: On track  Priority: High  Note:   Current Barriers:  Unable to independently monitor therapeutic efficacy Suboptimal therapeutic regimen for COPD/asthma  Pharmacist Clinical Goal(s):  Patient will achieve adherence to monitoring guidelines and medication adherence to achieve therapeutic efficacy adhere to plan to optimize therapeutic regimen for COPD/asthma as evidenced by report of adherence to recommended medication management changes through collaboration with PharmD and provider.   Interventions: 1:1 collaboration with [name], NP regarding development and update of comprehensive plan of care as evidenced by provider attestation and co-signature Inter-disciplinary care team collaboration (see longitudinal plan of care) Comprehensive medication review performed; medication list updated in electronic medical record  Diabetes (A1c goal <7%) -Controlled - A1c is at goal; pt has not yet started 3 mg dose of Trulicity (awaiting pt assistance approval - per [employer] they received the application but the form is missing patient signature and copy of insurance card) -Current medications: Trulicity 3  mg weekly (not started yet) - Thursdays Freestyle Libre -Medications previously tried: metformin  -Denies hypoglycemic/hyperglycemic symptoms -Current meal patterns: she uses [employer] meal service for DM-friendly meals -Current exercise: limited due to deconditioning -Educated on A1c and blood  sugar goals; Carbohydrate counting and/or plate method -Counseled to check feet daily and get yearly eye exams -Recommended to continue current medication; follow up with [employer] regarding pt assistance approval  Tachycardia (Goal: HR < 100) -Controlled - pt reports improvement in side effect since switching from succinate to tartrate; -Current treatment  Metoprolol tartrate 50 mg BID -Recommended to continue current medication  Hyperlipidemia: (LDL goal < 100) -Controlled - LDL is at goal; pt endorses compliance with medications; she is intolerant to statins -Current treatment: Repatha 140 mg q14 days Fenofibrate 145 mg daily Aspirin 81 mg daily -Educated on Cholesterol goals;  -Recommended to continue current medication  COPD / asthma (Goal: control symptoms and prevent exacerbations) -Not ideally controlled - pt reports she is not using Symbicort because it causes outbreaks of oral herpes, even though she is adamant about rinsing her mouth thoroughly after use; she uses rescue inhaler once a day typically; she endorses SOB with exertion -Current treatment  Symbicort 80-4.5 mcg/act 2 puff BID Levalbuterol HFA prn - once a day typically Levalbuterol nebulizer Cetirizine 10 mg HS Azelastine 137 mcg nasal spray PRN -Medications previously tried: Breo, Advair   -Pulmonary function testing: FEV1 at 63%, ratio 72, FVC 68% (04/2020) -Patient denies consistent use of maintenance inhaler -Frequency of rescue inhaler use: daily -Counseled on Benefits of consistent maintenance inhaler use -Discussed steroid (ICS) component of Symbicort is likely causing her outbreak problems; she may benefit from LAMA monotherapy or LAMA/LABA; discussed Spiriva Respimat delivery device as mist may be more tolerable than dry powder, pt is amenable to trial -Recommend Spiriva Respimat 2.5 mcg/act - will discuss with pulmonary  Bipolar Depression / Anxiety / Insomnia (Goal: manage symptoms) -Controlled -  pt reports current regimen is working well for her -managed per Dr Casimiro Needle -Current treatment: Desipramine 25 mg daily Duloxetine 20 mg - 2 cap daily Lamotrigine 150 mg - 2 tab HS Temazepam 30 mg 1-2 cap HS Trazodone 50 mg - 2 tab HS Hydroxyzine 50 mg - 2 tab HS -Medications previously tried/failed: Rexulti -PHQ9: 15 (05/2021) -Connected with Dr Casimiro Needle for mental health support -Educated on Benefits of medication for symptom control -Recommended to continue current medication  Fatigue (Goal: improve symptoms) -Uncontrolled - pt endorses significant fatigue; she reports sleeping patterns are "horrible" - goes to bed ~3am, typically does not fall asleep until after 6am, wakes in early afternoon; she reports fatigue has worsened over last several months, with significant worsening over past week or 2; of note she did receive COVID booster and Flu shot 09/03/21 -Discussed cause of fatigue likely multifactorial - poor sleep, deconditioning, MS, recent vaccines, beta blocker all could be contributing -Counseled on sleep hygiene; advised to follow up with neurology for update on MS; pt requests a referral to a new neurologist at [facility] clinic  Osteoarthritis (Goal: manage pain) -Controlled - pt reports pain improving since hip surgery -hip replacement 06/2021 -Current treatment  Methocarbamol 500 mg  Ibuprofen 200 mg - 2 tab AM -Counseled on long term risks of chronic NSAID use; advised to limit use/dose as much as possible  GERD (Goal: manage symptoms) -Controlled - Patient is satisfied with current regimen and denies issues -Current treatment  Pantoprazole 40 mg daily Famotidine 20 mg BID -Recommended to continue current  medication  Rash (Goal: manage symptoms) -Not ideally controlled - pt is not sure which topical products to use -Current treatment  Calcipotriene-betamethasone ointment - not using Ciclopirox - not taking Clindamycin solution - not taking Clobetasol foam,  solution - uses sometimes Fluocinonide solution Hydrocortisone cream - uses sometimes Neosporin Benadryl cream -Counseled on use/benefits of topical products; removed ciclopirox, clindamycin from medication list;  -Counseled on risks of high-potency topical steroids (clobetasol) and advised to avoid frequent/daily use  Health Maintenance -Vaccine gaps: Shingrix -Current therapy:  Miralax Meclizine 25 mg Vitamin B12 injection q30 days - not taking Vitamin D3 Lysine  Biotin 100 mg - 2 HS Valacyclovir 1000 mg daily during flares -Patient is satisfied with current therapy and denies issues -Recommended to continue current medication  Patient Goals/Self-Care Activities Patient will:  - take medications as prescribed as evidenced by patient report and record review focus on medication adherence by routine collaborate with provider on medication access solutions (Trulicity)      Medication Assistance:  Trulicity - [employer] PAP in process, expected approval 09/18/21  Compliance/Adherence/Medication fill history: Care Gaps: None  Star-Rating Drugs: Trulicity  Patient's preferred pharmacy is:  [facility], [place] [address] [place] [address] Phone: [phone] Fax: [phone]  Uses pill box? No - prefers bottles Pt endorses 100% compliance  We discussed: Current pharmacy is preferred with insurance plan and patient is satisfied with pharmacy services Patient decided to: Continue current medication management strategy  Care Plan and Follow Up Patient Decision:  Patient agrees to Care Plan and Follow-up.  Plan: Telephone follow up appointment with care management team member scheduled for:  3 months  Charlene Brooke, PharmD, [facility] Clinical Pharmacist [facility] [phone]

## 2021-09-10 NOTE — Progress Notes (Addendum)
PAP forms with instructions mailed to the patient. This is a copy for her to keep for her records. Signed form has already been faxed.  Debbora Dus, CPP notified  Avel Sensor, [place] Assistant [phone]  I have reviewed the care management and care coordination activities outlined in this encounter and I am certifying that I agree with the content of this note. No further action required.  Debbora Dus, PharmD Clinical Pharmacist [facility] at [facility] [phone]

## 2021-09-15 DIAGNOSIS — M159 Polyosteoarthritis, unspecified: Secondary | ICD-10-CM

## 2021-09-15 DIAGNOSIS — E785 Hyperlipidemia, unspecified: Secondary | ICD-10-CM

## 2021-09-15 DIAGNOSIS — J449 Chronic obstructive pulmonary disease, unspecified: Secondary | ICD-10-CM | POA: Diagnosis not present

## 2021-09-15 DIAGNOSIS — E119 Type 2 diabetes mellitus without complications: Secondary | ICD-10-CM | POA: Diagnosis not present

## 2021-09-15 NOTE — Patient Instructions (Signed)
Visit Information  Phone number for Pharmacist: [phone]  Thank you for meeting with me to discuss your medications! I look forward to working with you to achieve your health care goals. Below is a summary of what we talked about during the visit:   Goals Addressed             This Visit's Progress    Manage My Medicine       Timeframe:  Long-Range Goal Priority:  High Start Date:     09/10/21                    Expected End Date:     09/10/22                  Follow Up Date Jan 2023   - call for medicine refill 2 or 3 days before it runs out - call if I am sick and can't take my medicine - keep a list of all the medicines I take; vitamins and herbals too - use a pillbox to sort medicine  -collaborate with provider on medication access solutions (Trulicity)   Why is this important?   These steps will help you keep on track with your medicines.   Notes:         Care Plan : CCM Pharmacy Care Plan  Updates made by [name], RPH since 09/15/2021 12:00 AM     Problem: Hyperlipidemia, Diabetes, COPD, and Asthma, Tachycardia, Bipolar Disorder, Insomnia, GERD   Priority: High     Long-Range Goal: Disease management   Start Date: 09/15/2021  Expected End Date: 09/15/2022  This Visit's Progress: On track  Priority: High  Note:   Current Barriers:  Unable to independently monitor therapeutic efficacy Suboptimal therapeutic regimen for COPD/asthma  Pharmacist Clinical Goal(s):  Patient will achieve adherence to monitoring guidelines and medication adherence to achieve therapeutic efficacy adhere to plan to optimize therapeutic regimen for COPD/asthma as evidenced by report of adherence to recommended medication management changes through collaboration with PharmD and provider.   Interventions: 1:1 collaboration with [name], NP regarding development and update of comprehensive plan of care as evidenced by provider attestation and  co-signature Inter-disciplinary care team collaboration (see longitudinal plan of care) Comprehensive medication review performed; medication list updated in electronic medical record  Diabetes (A1c goal <7%) -Controlled - A1c is at goal; pt has not yet started 3 mg dose of Trulicity (awaiting pt assistance approval - per [employer] they received the application but the form is missing patient signature and copy of insurance card) -Current medications: Trulicity 3 mg weekly (not started yet) - Thursdays Freestyle Libre -Medications previously tried: metformin  -Denies hypoglycemic/hyperglycemic symptoms -Current meal patterns: she uses [employer] meal service for DM-friendly meals -Current exercise: limited due to deconditioning -Educated on A1c and blood sugar goals; Carbohydrate counting and/or plate method -Counseled to check feet daily and get yearly eye exams -Recommended to continue current medication; follow up with [employer] regarding pt assistance approval  Tachycardia (Goal: HR < 100) -Controlled - pt reports improvement in side effect since switching from succinate to tartrate; -Current treatment  Metoprolol tartrate 50 mg BID -Recommended to continue current medication  Hyperlipidemia: (LDL goal < 100) -Controlled - LDL is at goal; pt endorses compliance with medications; she is intolerant to statins -Current treatment: Repatha 140 mg q14 days Fenofibrate 145 mg daily Aspirin 81 mg daily -Educated on Cholesterol goals;  -Recommended to continue current medication  COPD /  asthma (Goal: control symptoms and prevent exacerbations) -Not ideally controlled - pt reports she is not using Symbicort because it causes outbreaks of oral herpes, even though she is adamant about rinsing her mouth thoroughly after use; she uses rescue inhaler once a day typically; she endorses SOB with exertion -Current treatment  Symbicort 80-4.5 mcg/act 2 puff BID Levalbuterol HFA prn - once a day  typically Levalbuterol nebulizer Cetirizine 10 mg HS Azelastine 137 mcg nasal spray PRN -Medications previously tried: Breo, Advair   -Pulmonary function testing: FEV1 at 63%, ratio 72, FVC 68% (04/2020) -Patient denies consistent use of maintenance inhaler -Frequency of rescue inhaler use: daily -Counseled on Benefits of consistent maintenance inhaler use -Discussed steroid (ICS) component of Symbicort is likely causing her outbreak problems; she may benefit from LAMA monotherapy or LAMA/LABA; discussed Spiriva Respimat delivery device as mist may be more tolerable than dry powder, pt is amenable to trial -Recommend Spiriva Respimat 2.5 mcg/act - will discuss with pulmonary  Bipolar Depression / Anxiety / Insomnia (Goal: manage symptoms) -Controlled - pt reports current regimen is working well for her -managed per Dr Casimiro Needle -Current treatment: Desipramine 25 mg daily Duloxetine 20 mg - 2 cap daily Lamotrigine 150 mg - 2 tab HS Temazepam 30 mg 1-2 cap HS Trazodone 50 mg - 2 tab HS Hydroxyzine 50 mg - 2 tab HS -Medications previously tried/failed: Rexulti -PHQ9: 15 (05/2021) -Connected with Dr Casimiro Needle for mental health support -Educated on Benefits of medication for symptom control -Recommended to continue current medication  Fatigue (Goal: improve symptoms) -Uncontrolled - pt endorses significant fatigue; she reports sleeping patterns are "horrible" - goes to bed ~3am, typically does not fall asleep until after 6am, wakes in early afternoon; she reports fatigue has worsened over last several months, with significant worsening over past week or 2; of note she did receive COVID booster and Flu shot 09/03/21 -Discussed cause of fatigue likely multifactorial - poor sleep, deconditioning, MS, recent vaccines, beta blocker all could be contributing -Counseled on sleep hygiene; advised to follow up with neurology for update on MS; pt requests a referral to a new neurologist at [facility]  clinic  Osteoarthritis (Goal: manage pain) -Controlled - pt reports pain improving since hip surgery -hip replacement 06/2021 -Current treatment  Methocarbamol 500 mg  Ibuprofen 200 mg - 2 tab AM -Counseled on long term risks of chronic NSAID use; advised to limit use/dose as much as possible  GERD (Goal: manage symptoms) -Controlled - Patient is satisfied with current regimen and denies issues -Current treatment  Pantoprazole 40 mg daily Famotidine 20 mg BID -Recommended to continue current medication  Rash (Goal: manage symptoms) -Not ideally controlled - pt is not sure which topical products to use -Current treatment  Calcipotriene-betamethasone ointment - not using Ciclopirox - not taking Clindamycin solution - not taking Clobetasol foam, solution - uses sometimes Fluocinonide solution Hydrocortisone cream - uses sometimes Neosporin Benadryl cream -Counseled on use/benefits of topical products; removed ciclopirox, clindamycin from medication list;  -Counseled on risks of high-potency topical steroids (clobetasol) and advised to avoid frequent/daily use  Health Maintenance -Vaccine gaps: Shingrix -Current therapy:  Miralax Meclizine 25 mg Vitamin B12 injection q30 days - not taking Vitamin D3 Lysine  Biotin 100 mg - 2 HS Valacyclovir 1000 mg daily during flares -Patient is satisfied with current therapy and denies issues -Recommended to continue current medication  Patient Goals/Self-Care Activities Patient will:  - take medications as prescribed as evidenced by patient report and record review focus on medication  adherence by routine collaborate with provider on medication access solutions (Trulicity)      Emily Moon was given information about Chronic Care Management services today including:  CCM service includes personalized support from designated clinical staff supervised by her physician, including individualized plan of care and coordination with other  care providers 24/7 contact phone numbers for assistance for urgent and routine care needs. Standard insurance, coinsurance, copays and deductibles apply for chronic care management only during months in which we provide at least 20 minutes of these services. Most insurances cover these services at 100%, however patients may be responsible for any copay, coinsurance and/or deductible if applicable. This service may help you avoid the need for more expensive face-to-face services. Only one practitioner may furnish and bill the service in a calendar month. The patient may stop CCM services at any time (effective at the end of the month) by phone call to the office staff.  Patient agreed to services and verbal consent obtained.   Patient verbalizes understanding of instructions provided today and agrees to view in [place].  Telephone follow up appointment with pharmacy team member scheduled for: 3 months  Charlene Brooke, PharmD, [place], CPP Clinical Pharmacist [facility] at [facility] [phone]

## 2021-09-17 DIAGNOSIS — G35 Multiple sclerosis: Secondary | ICD-10-CM

## 2021-09-18 ENCOUNTER — Telehealth: Payer: Self-pay

## 2021-09-18 NOTE — Telephone Encounter (Signed)
Called patient wanted to set her up with [facility] this afternoon. She is not able to come in today. Have made appointment to see [place] tomorrow. Reviewed UC/ ED precautions. Will get evaluated sooner if change in symptoms.

## 2021-09-18 NOTE — Telephone Encounter (Signed)
Noted  

## 2021-09-18 NOTE — Progress Notes (Signed)
Verified with Gean Birchwood  the patient is approved for Trulicity and is enrolled through December 2023. Patient notified  Charlene Brooke, [facility] notified  Avel Sensor, [place] Assistant [phone]  Total time spent for month CPA: 10 min

## 2021-09-19 ENCOUNTER — Ambulatory Visit (INDEPENDENT_AMBULATORY_CARE_PROVIDER_SITE_OTHER): Payer: PPO | Admitting: Internal Medicine

## 2021-09-19 ENCOUNTER — Other Ambulatory Visit: Payer: Self-pay

## 2021-09-19 ENCOUNTER — Encounter: Payer: Self-pay | Admitting: Internal Medicine

## 2021-09-19 VITALS — BP 118/82 | HR 97 | Temp 96.4°F | Ht 69.0 in

## 2021-09-19 DIAGNOSIS — R3915 Urgency of urination: Secondary | ICD-10-CM

## 2021-09-19 DIAGNOSIS — N3000 Acute cystitis without hematuria: Secondary | ICD-10-CM | POA: Diagnosis not present

## 2021-09-19 LAB — POC URINALSYSI DIPSTICK (AUTOMATED)
Blood, UA: NEGATIVE
Glucose, UA: NEGATIVE
Ketones, UA: NEGATIVE
Nitrite, UA: NEGATIVE
Protein, UA: POSITIVE — AB
Spec Grav, UA: 1.02 (ref 1.010–1.025)
Urobilinogen, UA: 1 E.U./dL
pH, UA: 6.5 (ref 5.0–8.0)

## 2021-09-19 MED ORDER — NITROFURANTOIN MONOHYD MACRO 100 MG PO CAPS
100.0000 mg | ORAL_CAPSULE | Freq: Two times a day (BID) | ORAL | 0 refills | Status: DC
Start: 2021-09-19 — End: 2021-11-04

## 2021-09-19 NOTE — Progress Notes (Signed)
Subjective:    Patient ID: Emily Moon, female    DOB: [birth year], 60 y.o.   MRN: [id number]  HPI Here with daughter due to urinary symptoms This visit occurred during the SARS-CoV-2 public health emergency.  Safety protocols were in place, including screening questions prior to the visit, additional usage of staff PPE, and extensive cleaning of exam room while observing appropriate contact time as indicated for disinfecting solutions.   Has feeling of pressure--in vagina Urgency--but then can't go Got really bad---azo helped some Started 4 days ago No recent problems--but did have in the past No blood  No fever No change in chronic back pain---or some higher  Stents for kidney stones in 2017  Current Outpatient Medications on File Prior to Visit  Medication Sig Dispense Refill   Azelastine HCl 137 MCG/SPRAY SOLN PLACE 2 SPRAYS INTO BOTH NOSTRILS 2 (TWO) TIMES DAILY 30 mL 5   B Complex-Biotin-FA (BIG 100, BIOTIN, PO) Take by mouth.     calcipotriene-betamethasone (TACLONEX) ointment Apply topically daily. 60 g 0   cetirizine (ZYRTEC) 10 MG tablet Take 10 mg by mouth daily.     clobetasol (OLUX) 0.05 % topical foam Apply to affected areas at arms, legs, trunk twice a day up to 2 weeks. Avoid applying to face, groin, and axilla. Use as directed. Risk of skin atrophy with long-term use reviewed. 50 g 0   clobetasol (TEMOVATE) 0.05 % external solution Mix clobetasol solution with  CeraVe cream Use twice daily to affected areas.Avoid Face, groin and underarm 50 mL 0   Continuous Blood Gluc Sensor (FREESTYLE LIBRE 14 DAY SENSOR) MISC PLACE INTO SKIN FOR CONTINUOUS CHECK OF BLOOD SUGAR DAILY 6 each 2   desipramine (NORPRAMIN) 25 MG tablet Take 1 tablet (25 mg total) by mouth daily. 30 tablet 6   diphenhydrAMINE (BENADRYL) 2 % cream Apply topically 3 (three) times daily as needed for itching.     Dulaglutide (TRULICITY) 3 XB/2.8UX SOPN Inject 3 mg as directed once a week. For diabetes.  2 mL 0   DULoxetine (CYMBALTA) 20 MG capsule Take 2 capsules (40 mg total) by mouth daily. 180 capsule 2   Evolocumab (REPATHA SURECLICK) 324 MG/ML SOAJ Inject 1 Dose into the skin every 14 (fourteen) days. 2 mL 3   famotidine (PEPCID) 20 MG tablet Take 1 tablet (20 mg total) by mouth 2 (two) times daily. 180 tablet 3   fenofibrate (TRICOR) 145 MG tablet Take 1 tablet (145 mg total) by mouth daily. 30 tablet 2   hydrocortisone 2.5 % cream Apply 1 application topically 2 (two) times daily as needed (underarm/groin rash).     hydrOXYzine (VISTARIL) 50 MG capsule TAKE 1 CAPSLE BY MOUTH AT NOON, 1 AT 6 PM, AND 1 AS NEEDED (Patient taking differently: Take 100 mg by mouth at bedtime. TAKE 1 CAPSLE BY MOUTH AT NOON, 1 AT 6 PM, AND 1 AS NEEDED) 270 capsule 2   lamoTRIgine (LAMICTAL) 150 MG tablet Take 2 tablets (300 mg total) by mouth at bedtime. 180 tablet 1   levalbuterol (XOPENEX HFA) 45 MCG/ACT inhaler INHALE 1 TO 2 PUFFS BY MOUTH EVERY 6 HOURS AS NEEDED FOR WHEEZE 1 each 0   Lysine 500 MG TABS Take by mouth.     meclizine (ANTIVERT) 25 MG tablet Take 0.5-1 tablets (12.5-25 mg total) by mouth 3 (three) times daily as needed for dizziness (sedation caution). 30 tablet 0   methocarbamol (ROBAXIN) 500 MG tablet Take 1-2 tablets (500-1,000 mg total) by  mouth at bedtime as needed for muscle spasms. 180 tablet 0   metoprolol tartrate (LOPRESSOR) 50 MG tablet Take 1 tablet (50 mg total) by mouth 2 (two) times daily. 180 tablet 3   neomycin-bacitracin-polymyxin (NEOSPORIN) [phone] ointment Apply 1 application topically 4 (four) times daily as needed (for cut/scrapes.).     pantoprazole (PROTONIX) 40 MG tablet Take 1 tablet (40 mg total) by mouth daily. For heartburn 90 tablet 3   polyethylene glycol powder (GLYCOLAX/MIRALAX) powder MIX 17 GRAMS (1 CAPFUL) WITH 4-8 OZ OF LIQUID AND TAKE BY MOUTH TWICE DAILY AS NEEDED 527 g 0   polyvinyl alcohol (LIQUIFILM TEARS) 1.4 % ophthalmic solution Place 2 drops into  both eyes at bedtime.     temazepam (RESTORIL) 30 MG capsule TAKE ONE TO TWO CAPSULES BY MOUTH NIGHTLY AT BEDTIME 60 capsule 1   traZODone (DESYREL) 50 MG tablet Take 2 tablets (100 mg total) by mouth at bedtime. 60 tablet 7   valACYclovir (VALTREX) 1000 MG tablet TAKE 1 TABLET (1,000 MG TOTAL) BY MOUTH DAILY. FOR HERPES OUTBREAK PREVENTION (Patient taking differently: Take 1,000 mg by mouth daily as needed. For herpes outbreak prevention) 90 tablet 0   fluocinonide (LIDEX) 0.05 % external solution Apply 1 application topically 2 (two) times daily as needed. Apply to scalp twice daily as need for itch (Patient not taking: Reported on 09/19/2021) 180 mL 3   No current facility-administered medications on file prior to visit.    Allergies  Allergen Reactions   Albuterol Shortness Of Breath and Other (See Comments)    Makes pt feel jittery/ tacycardic   Crestor [Rosuvastatin] Other (See Comments)    Joint pain, muscle pain, and hair loss   Halcion [Triazolam] Other (See Comments)    Dizziness,headaches,bladder problems   Levaquin [Levofloxacin In D5w] Diarrhea and Itching    Shoulder pain   Naproxen Sodium Swelling    Patient tolerates in small doses   Tylenol [Acetaminophen] Swelling    Patient tolerates in small doses   Cefaclor Other (See Comments)    Doesn't remember---unsure if actually allergic    Sulfa Antibiotics Itching    Unsure of reaction possibly itching   Tramadol Itching and Nausea And Vomiting   Zinc Other (See Comments)    constipation  constipation    Aripiprazole Other (See Comments)    Muscle tension/cramping   Diclofenac Sodium Rash    "made very sick"   Ibuprofen Swelling    Patient tolerates in small doses    Past Medical History:  Diagnosis Date   Arthritis    osteo   Asthma    Bipolar disorder ([place]) 05/21/2014   Cataracts, bilateral    COPD (chronic obstructive pulmonary disease) ([place])    DDD (degenerative disc disease), cervical    also back    Depression    Diabetes mellitus without complication (HCC)    Dizziness    Positional   Edema    feet/legs   Fibromyalgia syndrome    Fungal infection    Finger nails   GERD (gastroesophageal reflux disease)    Gout    Heart palpitations    Hip dysplasia, congenital 09/15/2013   History of kidney stones    Hypercholesterolemia    Multiple sclerosis (HCC)    weakness   Osteoporosis    osteoarthritis   Pneumonia    PONV (postoperative nausea and vomiting)    no problem after cataract surgery   Prediabetes 07/22/2018   Psoriasis    Shortness of breath  dyspnea    wheezing   Sleep apnea 2012   sleep study / slight, no interventions   Urinary frequency    Weight gain 06/21/2014    Past Surgical History:  Procedure Laterality Date   CATARACT EXTRACTION W/PHACO Left 05/21/2015   Procedure: CATARACT EXTRACTION PHACO AND INTRAOCULAR LENS PLACEMENT ([place]);  Surgeon: Birder Robson, MD;  Location: [facility] OR[facility];  Service: Ophthalmology;  Laterality: Left;  [place] 00:35 AP% 22.9 CDE 8.11 fluid pack lot #[id number]   CATARACT EXTRACTION W/PHACO Right 06/04/2015   Procedure: CATARACT EXTRACTION PHACO AND INTRAOCULAR LENS PLACEMENT ([facility]);  Surgeon: Birder Robson, MD;  Location: [facility] OR[facility];  Service: Ophthalmology;  Laterality: Right;  US:00:48 AP%: 10.5 CDE:5.08 Fluid lot #[id number]   CYSTOSCOPY/URETEROSCOPY/HOLMIUM LASER/STENT PLACEMENT Bilateral 09/22/2016   Procedure: CYSTOSCOPY/URETEROSCOPY/HOLMIUM LASER/STENT PLACEMENT;  Surgeon: Hollice Espy, MD;  Location: [facility] ORS;  Service: Urology;  Laterality: Bilateral;   EYE SURGERY  2015   tissue biopsy   FOOT SURGERY  2015   JOINT REPLACEMENT Left 2013   hip replacement   LITHOTRIPSY     PTOSIS REPAIR Bilateral 02/18/2016   Procedure: BILATERAL PTOSIS REPAIR UPPER EYELIDS;  Surgeon: Karle Starch, MD;  Location: [place];  Service: Ophthalmology;  Laterality: Bilateral;  LEAVE PT EARLY AM   thumb surgery Right    TONSILLECTOMY  1973    TOTAL HIP ARTHROPLASTY Right 07/02/2021   Procedure: TOTAL HIP ARTHROPLASTY ANTERIOR APPROACH;  Surgeon: Gaynelle Arabian, MD;  Location: [facility];  Service: Orthopedics;  Laterality: Right;    Family History  Problem Relation Age of Onset   Cancer Father        Abdomen with mastasis   Cancer Mother    Heart disease Mother    Kidney disease Neg Hx    Bladder Cancer Neg Hx    Prostate cancer Neg Hx    Kidney cancer Neg Hx     Social History   Socioeconomic History   Marital status: Divorced    Spouse name: Not on file   Number of children: 1   Years of education: Not on file   Highest education level: Not on file  Occupational History   Occupation: Therapist, 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  Tobacco Use   Smoking status: Former    Packs/day: 1.00    Years: 25.00    Pack years: 25.00    Types: Cigarettes    Quit date: 2019    Years since quitting: 3.8   Smokeless tobacco: Never   Tobacco comments:    occasional use  Vaping Use   Vaping Use: Former  Substance and Sexual Activity   Alcohol use: No    Alcohol/week: 0.0 standard drinks   Drug use: Yes    Types: Methylphenidate, Marijuana    Comment: Delta 8 - once per day   Sexual activity: Never  Other Topics Concern   Not on file  Social History Narrative   Not on file   Social Determinants of Health   Financial Resource Strain: Low Risk    Difficulty of Paying Living Expenses: Not hard at all  Food Insecurity: No Food Insecurity   Worried About Charity fundraiser in the Last Year: Never true   [place] in the Last Year: Never true  Transportation Needs: No Transportation Needs   Lack of Transportation (Medical): No   Lack of Transportation (Non-Medical): No  Physical Activity: Inactive   Days of Exercise per Week: 0 days  Minutes of Exercise per Session: 0 min  Stress: No Stress Concern Present   Feeling of Stress : Not at all  Social Connections: Not on file  Intimate Partner  Violence: Not At Risk   Fear of Current or Ex-Partner: No   Emotionally Abused: No   Physically Abused: No   Sexually Abused: No   Review of Systems No N/V Appetite is okay    Objective:   Physical Exam Constitutional:      Appearance: Normal appearance.  Abdominal:     Tenderness: There is no right CVA tenderness or left CVA tenderness.     Comments: Moderate suprapubic tenderness  Neurological:     Mental Status: She is alert.           Assessment & Plan:

## 2021-09-19 NOTE — Patient Instructions (Signed)
Please start the macrodantin twice a day. If your symptoms are gone within one to 2 doses, you can stop after 3 days.

## 2021-09-19 NOTE — Assessment & Plan Note (Signed)
3+ leuks Symptoms not consistent with stone Will treat with macrodantin ---may only need 3 days

## 2021-09-19 NOTE — Progress Notes (Signed)
    Chronic Care Management Pharmacy Assistant   Name: Emily Moon  MRN: [id number] DOB: 12-May-1961  Reason for Encounter: Trulicity Patient Assistance ([employer])  09/19/2021 - [place]; Approved 09/15/2021 - 11/15/2022 [employer] will contact the patient when Trulicity is ready to be shipped.  Called patient to inform her of information; patient expressed understanding. Time Spent: 11 Minutes  Debbora Dus, CPP notified  Marijean Niemann, [place] Assistant [phone]

## 2021-09-25 ENCOUNTER — Ambulatory Visit: Payer: PPO | Admitting: Dermatology

## 2021-09-25 DIAGNOSIS — R000 Tachycardia, unspecified: Secondary | ICD-10-CM | POA: Diagnosis not present

## 2021-09-26 ENCOUNTER — Telehealth: Payer: Self-pay | Admitting: *Deleted

## 2021-09-26 NOTE — Telephone Encounter (Signed)
-----   Message from [place], PA-C sent at 09/26/2021  2:31 PM EST ----- Heart monitor showed predominately normal rhythm, 7 runs SVT longet 5 beats. SVT seemed to be triggered events. Can we ask her if Lopressor twice daily improved symptoms?

## 2021-09-26 NOTE — Telephone Encounter (Signed)
Spoke with pt. Notified of event monitor results.  Pt states that taking Lopressor twice daily has significantly improved her symptoms.  Pt states that it is still at its worst when she first wakes up.  But after taking first dose of Lopressor 50 mg, she rarely has symptoms throughout the day.  Pt will follow up as scheduled and will let us know of any changes in the meantime.  Pt has no further questions at this time.

## 2021-09-27 DIAGNOSIS — G4733 Obstructive sleep apnea (adult) (pediatric): Secondary | ICD-10-CM | POA: Diagnosis not present

## 2021-10-02 ENCOUNTER — Other Ambulatory Visit: Payer: Self-pay | Admitting: Primary Care

## 2021-10-02 DIAGNOSIS — K1379 Other lesions of oral mucosa: Secondary | ICD-10-CM

## 2021-10-14 DIAGNOSIS — G35 Multiple sclerosis: Secondary | ICD-10-CM | POA: Diagnosis not present

## 2021-10-14 DIAGNOSIS — R200 Anesthesia of skin: Secondary | ICD-10-CM | POA: Diagnosis not present

## 2021-10-14 DIAGNOSIS — Z1331 Encounter for screening for depression: Secondary | ICD-10-CM | POA: Diagnosis not present

## 2021-10-14 DIAGNOSIS — R413 Other amnesia: Secondary | ICD-10-CM | POA: Diagnosis not present

## 2021-10-14 DIAGNOSIS — R531 Weakness: Secondary | ICD-10-CM | POA: Diagnosis not present

## 2021-10-15 ENCOUNTER — Other Ambulatory Visit: Payer: Self-pay

## 2021-10-15 MED ORDER — FENOFIBRATE 145 MG PO TABS: 145.0000 mg | ORAL_TABLET | Freq: Every day | ORAL | 2 refills | Status: DC

## 2021-10-21 ENCOUNTER — Encounter: Payer: Self-pay | Admitting: Dermatology

## 2021-10-21 ENCOUNTER — Other Ambulatory Visit: Payer: Self-pay

## 2021-10-21 ENCOUNTER — Ambulatory Visit: Payer: PPO | Admitting: Dermatology

## 2021-10-21 DIAGNOSIS — L03011 Cellulitis of right finger: Secondary | ICD-10-CM | POA: Diagnosis not present

## 2021-10-21 DIAGNOSIS — L304 Erythema intertrigo: Secondary | ICD-10-CM | POA: Diagnosis not present

## 2021-10-21 DIAGNOSIS — L720 Epidermal cyst: Secondary | ICD-10-CM

## 2021-10-21 DIAGNOSIS — L03012 Cellulitis of left finger: Secondary | ICD-10-CM

## 2021-10-21 DIAGNOSIS — D2372 Other benign neoplasm of skin of left lower limb, including hip: Secondary | ICD-10-CM | POA: Diagnosis not present

## 2021-10-21 DIAGNOSIS — L308 Other specified dermatitis: Secondary | ICD-10-CM | POA: Diagnosis not present

## 2021-10-21 DIAGNOSIS — D239 Other benign neoplasm of skin, unspecified: Secondary | ICD-10-CM

## 2021-10-21 NOTE — Patient Instructions (Addendum)
Restart Ketoconazole cream apply to skin folds once a day  Restart Hydrocortisone cream apply to skin folds once a day,    Gentle Skin Care Guide  1. Bathe no more than once a day.  2. Avoid bathing in hot water  3. Use a mild soap like Dove, Vanicream, Cetaphil, CeraVe. Can use Lever 2000 or Cetaphil antibacterial soap  4. Use soap only where you need it. On most days, use it under your arms, between your legs, and on your feet. Let the water rinse other areas unless visibly dirty.  5. When you get out of the bath/shower, use a towel to gently blot your skin dry, don't rub it.  6. While your skin is still a little damp, apply a moisturizing cream such as Vanicream, CeraVe, Cetaphil, Eucerin, Sarna lotion or plain Vaseline Jelly. For hands apply Neutrogena [place] Hand Cream or Excipial Hand Cream.  7. Reapply moisturizer any time you start to itch or feel dry.  8. Sometimes using free and clear laundry detergents can be helpful. Fabric softener sheets should be avoided. Downy Free & Gentle liquid, or any liquid fabric softener that is free of dyes and perfumes, it acceptable to use  9. If your doctor has given you prescription creams you may apply moisturizers over them       Topical steroids (such as triamcinolone, fluocinolone, fluocinonide, mometasone, clobetasol, halobetasol, betamethasone, hydrocortisone) can cause thinning and lightening of the skin if they are used for too long in the same area. Your physician has selected the right strength medicine for your problem and area affected on the body. Please use your medication only as directed by your physician to prevent side effects.      If You Need Anything After Your Visit  If you have any questions or concerns for your doctor, please call our main line at [phone] and press option 4 to reach your doctor's medical assistant. If no one answers, please leave a voicemail as directed and we will return your call as  soon as possible. Messages left after 4 pm will be answered the following business day.   You may also send [place] a message via [place]. We typically respond to MyChart messages within 1-2 business days.  For prescription refills, please ask your pharmacy to contact our office. Our fax number is [phone].  If you have an urgent issue when the clinic is closed that cannot wait until the next business day, you can page your doctor at the number below.    Please note that while we do our best to be available for urgent issues outside of office hours, we are not available 24/7.   If you have an urgent issue and are unable to reach [place], you may choose to seek medical care at your doctor's office, retail clinic, urgent care center, or emergency room.  If you have a medical emergency, please immediately call 911 or go to the emergency department.  Pager Numbers  - Dr. Nehemiah Massed: [phone]  - Dr. Laurence Ferrari: [phone]  - Dr. Nicole Kindred: [phone]  In the event of inclement weather, please call our main line at [phone] for an update on the status of any delays or closures.  Dermatology Medication Tips: Please keep the boxes that topical medications come in in order to help keep track of the instructions about where and how to use these. Pharmacies typically print the medication instructions only on the boxes and not directly on the medication tubes.   If your medication  is too expensive, please contact our office at [phone] option 4 or send [place] a message through [place].   We are unable to tell what your co-pay for medications will be in advance as this is different depending on your insurance coverage. However, we may be able to find a substitute medication at lower cost or fill out paperwork to get insurance to cover a needed medication.   If a prior authorization is required to get your medication covered by your insurance company, please allow [place] 1-2 business days to complete this  process.  Drug prices often vary depending on where the prescription is filled and some pharmacies may offer cheaper prices.  The website www.goodrx.com contains coupons for medications through different pharmacies. The prices here do not account for what the cost may be with help from insurance (it may be cheaper with your insurance), but the website can give you the price if you did not use any insurance.  - You can print the associated coupon and take it with your prescription to the pharmacy.  - You may also stop by our office during regular business hours and pick up a GoodRx coupon card.  - If you need your prescription sent electronically to a different pharmacy, notify our office through [facility] or by phone at [phone] option 4.     Si Usted Necesita Algo Despus de Su Visita  Tambin puede enviarnos un mensaje a travs de Pharmacist, community. Por lo general respondemos a los mensajes de MyChart en el transcurso de 1 a 2 das hbiles.  Para renovar recetas, por favor pida a su farmacia que se ponga en contacto con nuestra oficina. Harland Dingwall de fax es [place] [phone].  Si tiene un asunto urgente cuando la clnica est cerrada y que no puede esperar hasta el siguiente da hbil, puede llamar/localizar a su doctor(a) al nmero que aparece a continuacin.   Por favor, tenga en cuenta que aunque hacemos todo lo posible para estar disponibles para asuntos urgentes fuera del horario de [place], no estamos disponibles las 24 horas del da, los 7 das de la [place].   Si tiene un problema urgente y no puede comunicarse con nosotros, puede optar por buscar atencin mdica  en el consultorio de su doctor(a), en una clnica privada, en un centro de atencin urgente o en una sala de emergencias.  Si tiene Engineering geologist, por favor llame inmediatamente al 911 o vaya a la sala de emergencias.  Nmeros de bper  - Dr. Nehemiah Massed: [phone]  - Dra. Moye: [phone]  - Dra.  Nicole Kindred: [phone]  En caso de inclemencias del [place], por favor llame a Johnsie Kindred principal al [phone] para una actualizacin sobre el [place] de cualquier retraso o cierre.  Consejos para la medicacin en dermatologa: Por favor, guarde las cajas en las que vienen los medicamentos de uso tpico para ayudarle a seguir las instrucciones sobre dnde y cmo usarlos. Las farmacias generalmente imprimen las instrucciones del medicamento slo en las cajas y no directamente en los tubos del [place].   Si su medicamento es muy caro, por favor, pngase en contacto con Zigmund Daniel llamando al [phone] y presione la opcin 4 o envenos un mensaje a travs de Pharmacist, community.   No podemos decirle cul ser su copago por los medicamentos por adelantado ya que esto es diferente dependiendo de la cobertura de su seguro. Sin embargo, es posible que podamos encontrar un medicamento sustituto a Electrical engineer un formulario para que  el seguro cubra el medicamento que se considera necesario.   Si se requiere una autorizacin previa para que su compaa de seguros [place] su medicamento, por favor permtanos de 1 a 2 das hbiles para completar este proceso.  Los precios de los medicamentos varan con frecuencia dependiendo del Environmental consultant de dnde se surte la receta y alguna farmacias pueden ofrecer precios ms baratos.  El sitio web www.goodrx.com tiene cupones para medicamentos de Airline pilot. Los precios aqu no tienen en cuenta lo que podra costar con la ayuda del seguro (puede ser ms barato con su seguro), pero el sitio web puede darle el precio si no utiliz Research scientist (physical sciences).  - Puede imprimir el cupn correspondiente y llevarlo con su receta a la farmacia.  - Tambin puede pasar por nuestra oficina durante el horario de atencin regular y Charity fundraiser una tarjeta de cupones de GoodRx.  - Si necesita que su receta se enve electrnicamente a una farmacia diferente, informe a nuestra oficina a  travs de MyChart de [place] o por telfono llamando al [phone] y presione la opcin 4.

## 2021-10-21 NOTE — Progress Notes (Signed)
Follow-Up Visit   Subjective  Emily Moon is a 60 y.o. female who presents for the following: Follow-up (5 months f/u on eczema on her body using Clobetasol foam but not regularly ). The patient has spots, moles and lesions to be evaluated, some may be new or changing and the patient has concerns that these could be cancer. Pt c/o disfigured thumbnails, pt picks at her nails   The following portions of the chart were reviewed this encounter and updated as appropriate:   Tobacco  Allergies  Meds  Problems  Med Hx  Surg Hx  Fam Hx      Review of Systems:  No other skin or systemic complaints except as noted in HPI or Assessment and Plan.  Objective  Well appearing patient in no apparent distress; mood and affect are within normal limits.  A focused examination was performed including face,chest,back,abdomen, legs, fingernails and fingers. Relevant physical exam findings are noted in the Assessment and Plan.  left ankle Firm pink/brown papulenodule with dimple sign.   Mid Back Subcutaneous papule/nodule with erythema and edema, tender to touch.   chest, back, hands, legs Scaly pink papules coalescing to plaques at chest, also with nummular scaly pink plaques at legs and left hand  skin folds Mild erythema   thumbnails Erythema at proximal nail fold and loss of cuticle at bilateral thumbs   Assessment & Plan  Dermatofibroma left ankle  - Benign appearing - Call for any changes   Epidermal inclusion cyst Mid Back  Benign-appearing. Exam most consistent with an epidermal inclusion cyst. Discussed that a cyst is a benign growth that can grow over time and sometimes get irritated or inflamed. Recommend observation if it is not bothersome. Discussed option of surgical excision to remove it if it is growing, symptomatic, or other changes noted. Please call for new or changing lesions so they can be evaluated.   We will recheck at follow up visit, may consider  surgery removal   Other eczema chest, back, hands, legs  Chronic condition with duration over one year. Condition is bothersome to patient. Currently flared.  Eczema +/- Psoriasis   Atopic dermatitis (eczema) is a chronic, relapsing, pruritic condition that can significantly affect quality of life. It is often associated with allergic rhinitis and/or asthma and can require treatment with topical medications, phototherapy, or in severe cases biologic injectable medication (Dupixent; Adbry) or Oral JAK inhibitors.   Restart Clobetasol foam apply to affected areas twice a day for 2 weeks then stop. Avoid applying to face, groin, and axilla. Use as directed. Long-term use can cause thinning of the skin.  If no better after using Clobetasol foam twice a day for 2 weeks pt will call here.   Gentle skin care handout provided.  Related Medications clobetasol (OLUX) 0.05 % topical foam Apply to affected areas at arms, legs, trunk twice a day up to 2 weeks. Avoid applying to face, groin, and axilla. Use as directed. Risk of skin atrophy with long-term use reviewed.  Erythema intertrigo skin folds  Chronic condition with duration or expected duration over one year. Condition is bothersome to patient. Currently flared.  Intertrigo is a chronic recurrent rash that occurs in skin fold areas that may be associated with friction; heat; moisture; yeast; fungus; and bacteria.  It is exacerbated by increased movement / activity; sweating; and higher atmospheric temperature.   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  I, Marye Round, CMA, am acting as scribe for Forest Gleason, MD .   Documentation: I have reviewed the above documentation for accuracy and completeness, and I agree with the above.  Forest Gleason, MD

## 2021-10-23 ENCOUNTER — Other Ambulatory Visit: Payer: Self-pay | Admitting: Primary Care

## 2021-10-23 ENCOUNTER — Encounter: Payer: Self-pay | Admitting: Pharmacist

## 2021-10-23 DIAGNOSIS — M2550 Pain in unspecified joint: Secondary | ICD-10-CM

## 2021-10-23 DIAGNOSIS — M545 Low back pain, unspecified: Secondary | ICD-10-CM

## 2021-10-23 NOTE — Progress Notes (Unsigned)
[facility])                                            [place] Team                                        Statin Quality Measure Assessment    10/23/2021  Emily Moon 05-26-1961 [id number]  Per review of chart and payor information, patient has a diagnosis of diabetes but is not currently filling a statin prescription.  This places patient into the SUPD (Statin Use In Patients with Diabetes) measure for CMS.    Patient has documented trials of rosuvastatin with reported joint pain and muscle pain, but no corresponding CPT codes that would exclude patient from SUPD measure.  The 10-year ASCVD risk score (Arnett DK, et al., 2019) is: 4.9%   Values used to calculate the score:     Age: 60 years     Sex: Female     Is Non-Hispanic African American: No     Diabetic: Yes     Tobacco smoker: No     Systolic Blood Pressure: 370 mmHg     Is BP treated: Yes     HDL Cholesterol: 39.9 mg/dL     Total Cholesterol: 159 mg/dL 03/10/2021     Component Value Date/Time   CHOL 159 03/10/2021 1628   TRIG 189.0 (H) 03/10/2021 1628   HDL 39.90 03/10/2021 1628   CHOLHDL 4 03/10/2021 1628   VLDL 37.8 03/10/2021 1628   LDLCALC 81 03/10/2021 1628   LDLDIRECT 53.0 08/01/2020 1254    Please consider ONE of the following recommendations:  Initiate high intensity statin Atorvastatin 40mg  once daily, #90, 3 refills   Rosuvastatin 20mg  once daily, #90, 3 refills    Initiate moderate intensity          statin with reduced frequency if prior          statin intolerance 1x weekly, #13, 3 refills   2x weekly, #26, 3 refills   3x weekly, #39, 3 refills    Code for past statin intolerance or  other exclusions (required annually)  Provider Requirements: Associate code during an office visit or telehealth encounter  Drug Induced Myopathy G72.0   Myopathy, unspecified G72.9   Myositis, unspecified M60.9   Rhabdomyolysis M62.82    Cirrhosis of liver K74.69   Prediabetes R73.03   PCOS E28.2    Loretha Brasil, PharmD [place] Clinical Pharmacist Office: [phone]

## 2021-10-27 ENCOUNTER — Other Ambulatory Visit (HOSPITAL_COMMUNITY): Payer: Self-pay | Admitting: *Deleted

## 2021-10-27 ENCOUNTER — Other Ambulatory Visit: Payer: Self-pay

## 2021-10-27 ENCOUNTER — Encounter: Payer: Self-pay | Admitting: Cardiology

## 2021-10-27 ENCOUNTER — Ambulatory Visit: Payer: PPO | Admitting: Cardiology

## 2021-10-27 VITALS — BP 110/64 | HR 90 | Ht 69.0 in

## 2021-10-27 DIAGNOSIS — R000 Tachycardia, unspecified: Secondary | ICD-10-CM

## 2021-10-27 DIAGNOSIS — E782 Mixed hyperlipidemia: Secondary | ICD-10-CM

## 2021-10-27 DIAGNOSIS — G4733 Obstructive sleep apnea (adult) (pediatric): Secondary | ICD-10-CM | POA: Diagnosis not present

## 2021-10-27 MED ORDER — TEMAZEPAM 30 MG PO CAPS: ORAL_CAPSULE | ORAL | 1 refills | Status: DC

## 2021-10-27 NOTE — Patient Instructions (Signed)
Medication Instructions:  Your physician recommends that you continue on your current medications as directed. Please refer to the Current Medication list given to you today.  *If you need a refill on your cardiac medications before your next appointment, please call your pharmacy*   Lab Work: None ordered If you have labs (blood work) drawn today and your tests are completely normal, you will receive your results only by: [place] (if you have MyChart) OR A paper copy in the mail If you have any lab test that is abnormal or we need to change your treatment, we will call you to review the results.   Testing/Procedures: None ordered   Follow-Up: At [facility], you and your health needs are our priority.  As part of our continuing mission to provide you with exceptional heart care, we have created designated Provider Care Teams.  These Care Teams include your primary Cardiologist (physician) and Advanced Practice Providers (APPs -  Physician Assistants and Nurse Practitioners) who all work together to provide you with the care you need, when you need it.  We recommend signing up for the patient portal called "MyChart".  Sign up information is provided on this After Visit Summary.  MyChart is used to connect with patients for Virtual Visits (Telemedicine).  Patients are able to view lab/test results, encounter notes, upcoming appointments, etc.  Non-urgent messages can be sent to your provider as well.   To learn more about what you can do with MyChart, go to [email or web address].    Your next appointment:   Your physician wants you to follow-up in: 1 year You will receive a reminder letter in the mail two months in advance. If you don't receive a letter, please call our office to schedule the follow-up appointment.   The format for your next appointment:   In Person  Provider:   You may see [name] Sable, MD or one of the following Advanced Practice Providers on your  designated Care Team:   Murray Hodgkins, NP Christell Faith, PA-C Cadence Kathlen Mody, PA-C :1}    Other Instructions N/A

## 2021-10-27 NOTE — Progress Notes (Signed)
Cardiology Office Note:    Date:  10/27/2021   ID:  Emily Moon, DOB 11-23-1960, MRN [id number]  PCP:  Emily Koch, NP  [facility] Cardiologist:  Emily Sable, MD  [place] Electrophysiologist:  None   Referring MD: Emily Koch, NP   Chief Complaint  Patient presents with   Other    4 month follow up -- Meds reviewed verbally with patient.      History of Present Illness:    Emily Moon is a 60 y.o. female with a hx of asthma, bipolar, OSA, diabetes, hyperlipidemia, GERD, former smoker x15 years who presents for follow-up.    Patient is being seen for hyperlipidemia and inappropriate sinus tachycardia.  Previously did not tolerate higher doses of Toprol-XL above 75 mg.  Toprol-XL was switched to Lopressor 50 mg twice daily.  Her heart rate seems better controlled.  States adhering to low-cholesterol diet.  Also feels tired from trying to lose weight.  Prior notes. Echo 05/2021 EF 60 to 65%  She has obstructive pulmonary disease and is seeing pulmonary medicine for this.  She used to follow-up in [place] at the hyperlipidemia clinic where Repatha was started.  She has allergies to statins.  Established care in [place] for better commute.  Past Medical History:  Diagnosis Date   Arthritis    osteo   Asthma    Bipolar disorder ([place]) 05/21/2014   Cataracts, bilateral    COPD (chronic obstructive pulmonary disease) (HCC)    DDD (degenerative disc disease), cervical    also back   Depression    Diabetes mellitus without complication (HCC)    Dizziness    Positional   Edema    feet/legs   Fibromyalgia syndrome    Fungal infection    Finger nails   GERD (gastroesophageal reflux disease)    Gout    Heart palpitations    Hip dysplasia, congenital 09/15/2013   History of kidney stones    Hypercholesterolemia    Multiple sclerosis (HCC)    weakness   Osteoporosis    osteoarthritis   Pneumonia    PONV (postoperative nausea  and vomiting)    no problem after cataract surgery   Prediabetes 07/22/2018   Psoriasis    Shortness of breath dyspnea    wheezing   Sleep apnea 2012   sleep study / slight, no interventions   Urinary frequency    Weight gain 06/21/2014    Past Surgical History:  Procedure Laterality Date   CATARACT EXTRACTION W/PHACO Left 05/21/2015   Procedure: CATARACT EXTRACTION PHACO AND INTRAOCULAR LENS PLACEMENT ([place]);  Surgeon: Birder Robson, MD;  Location: [facility];  Service: Ophthalmology;  Laterality: Left;  [place] 00:35 AP% 22.9 CDE 8.11 fluid pack lot #[id number]   CATARACT EXTRACTION W/PHACO Right 06/04/2015   Procedure: CATARACT EXTRACTION PHACO AND INTRAOCULAR LENS PLACEMENT ([facility]);  Surgeon: Birder Robson, MD;  Location: [facility];  Service: Ophthalmology;  Laterality: Right;  US:00:48 AP%: 10.5 CDE:5.08 Fluid lot #[id number]   CYSTOSCOPY/URETEROSCOPY/HOLMIUM LASER/STENT PLACEMENT Bilateral 09/22/2016   Procedure: CYSTOSCOPY/URETEROSCOPY/HOLMIUM LASER/STENT PLACEMENT;  Surgeon: Hollice Espy, MD;  Location: [facility];  Service: Urology;  Laterality: Bilateral;   EYE SURGERY  2015   tissue biopsy   FOOT SURGERY  2015   JOINT REPLACEMENT Left 2013   hip replacement   LITHOTRIPSY     PTOSIS REPAIR Bilateral 02/18/2016   Procedure: BILATERAL PTOSIS REPAIR UPPER EYELIDS;  Surgeon: Karle Starch, MD;  Location: [facility];  Service:  Ophthalmology;  Laterality: Bilateral;  LEAVE PT EARLY AM   thumb surgery Right    TONSILLECTOMY  1973   TOTAL HIP ARTHROPLASTY Right 07/02/2021   Procedure: TOTAL HIP ARTHROPLASTY ANTERIOR APPROACH;  Surgeon: Gaynelle Arabian, MD;  Location: [facility];  Service: Orthopedics;  Laterality: Right;    Current Medications: Current Meds  Medication Sig   Azelastine HCl 137 MCG/SPRAY SOLN PLACE 2 SPRAYS INTO BOTH NOSTRILS 2 (TWO) TIMES DAILY   B Complex-Biotin-FA (BIG 100, BIOTIN, PO) Take by mouth.   calcipotriene-betamethasone (TACLONEX) ointment Apply  topically daily.   cetirizine (ZYRTEC) 10 MG tablet Take 10 mg by mouth daily.   clobetasol (OLUX) 0.05 % topical foam Apply to affected areas at arms, legs, trunk twice a day up to 2 weeks. Avoid applying to face, groin, and axilla. Use as directed. Risk of skin atrophy with long-term use reviewed.   clobetasol (TEMOVATE) 0.05 % external solution Mix clobetasol solution with  CeraVe cream Use twice daily to affected areas.Avoid Face, groin and underarm   Continuous Blood Gluc Sensor (FREESTYLE LIBRE 14 DAY SENSOR) MISC PLACE INTO SKIN FOR CONTINUOUS CHECK OF BLOOD SUGAR DAILY   desipramine (NORPRAMIN) 25 MG tablet Take 1 tablet (25 mg total) by mouth daily.   diphenhydrAMINE (BENADRYL) 2 % cream Apply topically 3 (three) times daily as needed for itching.   Dulaglutide (TRULICITY) 3 TK/1.6WF SOPN Inject 3 mg as directed once a week. For diabetes.   DULoxetine (CYMBALTA) 20 MG capsule Take 2 capsules (40 mg total) by mouth daily.   Evolocumab (REPATHA SURECLICK) 093 MG/ML SOAJ Inject 1 Dose into the skin every 14 (fourteen) days.   famotidine (PEPCID) 20 MG tablet Take 1 tablet (20 mg total) by mouth 2 (two) times daily.   fenofibrate (TRICOR) 145 MG tablet Take 1 tablet (145 mg total) by mouth daily.   fluocinonide (LIDEX) 0.05 % external solution Apply 1 application topically 2 (two) times daily as needed. Apply to scalp twice daily as need for itch   hydrocortisone 2.5 % cream Apply 1 application topically 2 (two) times daily as needed (underarm/groin rash).   hydrOXYzine (VISTARIL) 50 MG capsule TAKE 1 CAPSLE BY MOUTH AT NOON, 1 AT 6 PM, AND 1 AS NEEDED (Patient taking differently: Take 100 mg by mouth at bedtime. TAKE 1 CAPSLE BY MOUTH AT NOON, 1 AT 6 PM, AND 1 AS NEEDED)   lamoTRIgine (LAMICTAL) 150 MG tablet Take 2 tablets (300 mg total) by mouth at bedtime.   levalbuterol (XOPENEX HFA) 45 MCG/ACT inhaler INHALE 1 TO 2 PUFFS BY MOUTH EVERY 6 HOURS AS NEEDED FOR WHEEZE   Lysine 500 MG TABS  Take by mouth.   meclizine (ANTIVERT) 25 MG tablet Take 0.5-1 tablets (12.5-25 mg total) by mouth 3 (three) times daily as needed for dizziness (sedation caution).   methocarbamol (ROBAXIN) 500 MG tablet TAKE 1-2 TABLETS (500-1,000 MG TOTAL) BY MOUTH AT BEDTIME AS NEEDED FOR MUSCLE SPASMS.   metoprolol tartrate (LOPRESSOR) 50 MG tablet Take 1 tablet (50 mg total) by mouth 2 (two) times daily.   neomycin-bacitracin-polymyxin (NEOSPORIN) [phone] ointment Apply 1 application topically 4 (four) times daily as needed (for cut/scrapes.).   nitrofurantoin, macrocrystal-monohydrate, (MACROBID) 100 MG capsule Take 1 capsule (100 mg total) by mouth 2 (two) times daily.   pantoprazole (PROTONIX) 40 MG tablet Take 1 tablet (40 mg total) by mouth daily. For heartburn   polyethylene glycol powder (GLYCOLAX/MIRALAX) powder MIX 17 GRAMS (1 CAPFUL) WITH 4-8 OZ OF LIQUID AND TAKE  BY MOUTH TWICE DAILY AS NEEDED   polyvinyl alcohol (LIQUIFILM TEARS) 1.4 % ophthalmic solution Place 2 drops into both eyes at bedtime.   temazepam (RESTORIL) 30 MG capsule TAKE ONE TO TWO CAPSULES BY MOUTH NIGHTLY AT BEDTIME   traZODone (DESYREL) 50 MG tablet Take 2 tablets (100 mg total) by mouth at bedtime.   valACYclovir (VALTREX) 1000 MG tablet TAKE 1 TABLET (1,000 MG TOTAL) BY MOUTH DAILY. 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  Tobacco Use   Smoking status: Former    Packs/day: 1.00    Years: 25.00    Pack years:  25.00    Types: Cigarettes    Quit date: 2019    Years since quitting: 3.9   Smokeless tobacco: Never   Tobacco comments:    occasional use  Vaping Use   Vaping Use: Former  Substance and Sexual Activity   Alcohol use: No    Alcohol/week: 0.0 standard drinks   Drug use: Yes    Types: Methylphenidate, Marijuana    Comment: Delta 8 - once per day   Sexual activity: Never  Other Topics Concern   Not on file  Social History Narrative   Not on file   Social Determinants of Health   Financial Resource Strain: Low Risk    Difficulty of Paying Living Expenses: Not hard at all  Food Insecurity: No Food Insecurity   Worried About Charity fundraiser in the Last Year: Never true   Ran Out of Food in the Last Year: Never true  Transportation Needs: No Transportation Needs   Lack of Transportation (Medical): No   Lack of Transportation (Non-Medical): No  Physical Activity: Inactive   Days of Exercise per Week: 0 days   Minutes of Exercise per Session: 0 min  Stress: No Stress Concern Present   Feeling of Stress : Not at all  Social Connections: Not on file     Family History: The patient's family history includes Cancer in her father and mother; Heart disease in her mother. There is no history of Kidney disease, Bladder Cancer, Prostate cancer, or Kidney cancer.  ROS:   Please see the history of present illness.     All other systems reviewed and are negative.  EKGs/Labs/Other Studies Reviewed:    The following studies were reviewed today:   EKG:  EKG is ordered today.  EKG shows normal sinus rhythm, heart rate 90.  Recent Labs: 06/23/2021: ALT 44 07/03/2021: BUN 18; Creatinine, Ser 0.94; Hemoglobin 10.4; Platelets 384; Potassium 3.6; Sodium 139  Recent Lipid Panel    Component Value Date/Time   CHOL 159 03/10/2021 1628   TRIG 189.0 (H) 03/10/2021 1628   HDL 39.90 03/10/2021 1628   CHOLHDL 4 03/10/2021 1628   VLDL 37.8 03/10/2021 1628   LDLCALC 81 03/10/2021 1628    LDLDIRECT 53.0 08/01/2020 1254    Physical Exam:    VS:  BP 110/64 (BP Location: Left Arm, Patient Position: Sitting, Cuff Size: Normal)  Pulse 90   Ht 5\' 9"  (1.753 m)   LMP 08/23/2014   SpO2 98%   BMI 35.33 kg/m     Wt Readings from Last 3 Encounters:  09/08/21 239 lb 4 oz (108.5 kg)  09/02/21 229 lb (103.9 kg)  08/22/21 235 lb (106.6 kg)     GEN:  Well nourished, well developed in no acute distress HEENT: Normal NECK: No JVD; No carotid bruits LYMPHATICS: No lymphadenopathy CARDIAC: RRR, no murmurs, rubs, gallops RESPIRATORY:  Clear to auscultation without rales,  ABDOMEN: Soft, non-tender, distended MUSCULOSKELETAL:  No edema; No deformity  SKIN: Warm and dry NEUROLOGIC:  Alert and oriented x 3 PSYCHIATRIC:  Normal affect   ASSESSMENT:    1. Inappropriate sinus tachycardia   2. Mixed hyperlipidemia    PLAN:    In order of problems listed above:  Inappropriate sinus tach, heart rate controlled. Continue Lopressor 50 mg twice daily Mixed hyperlipidemia, intolerant to statins..  Continue Repatha, fenofibrate.  Low-cholesterol diet advised.  Follow-up yearly   Medication Adjustments/Labs and Tests Ordered: Current medicines are reviewed at length with the patient today.  Concerns regarding medicines are outlined above.  Orders Placed This Encounter  Procedures   EKG 12-Lead     No orders of the defined types were placed in this encounter.    Patient Instructions  Medication Instructions:  Your physician recommends that you continue on your current medications as directed. Please refer to the Current Medication list given to you today.  *If you need a refill on your cardiac medications before your next appointment, please call your pharmacy*   Lab Work: None ordered If you have labs (blood work) drawn today and your tests are completely normal, you will receive your results only by: [place] (if you have MyChart) OR A paper copy in the  mail If you have any lab test that is abnormal or we need to change your treatment, we will call you to review the results.   Testing/Procedures: None ordered   Follow-Up: At [facility], you and your health needs are our priority.  As part of our continuing mission to provide you with exceptional heart care, we have created designated Provider Care Teams.  These Care Teams include your primary Cardiologist (physician) and Advanced Practice Providers (APPs -  Physician Assistants and Nurse Practitioners) who all work together to provide you with the care you need, when you need it.  We recommend signing up for the patient portal called "MyChart".  Sign up information is provided on this After Visit Summary.  MyChart is used to connect with patients for Virtual Visits (Telemedicine).  Patients are able to view lab/test results, encounter notes, upcoming appointments, etc.  Non-urgent messages can be sent to your provider as well.   To learn more about what you can do with MyChart, go to [email or web address].    Your next appointment:   Your physician wants you to follow-up in: 1 year You will receive a reminder letter in the mail two months in advance. If you don't receive a letter, please call our office to schedule the follow-up appointment.   The format for your next appointment:   In Person  Provider:   You may see Emily Sable, MD or one of the following Advanced Practice Providers on your designated Care Team:   Murray Hodgkins, NP Christell Faith, PA-C Cadence Kathlen Mody, PA-C :1}    Other Instructions N/A   Signed, Emily Sable, MD  10/27/2021 5:12 PM  [facility]

## 2021-11-04 ENCOUNTER — Ambulatory Visit (INDEPENDENT_AMBULATORY_CARE_PROVIDER_SITE_OTHER): Payer: PPO | Admitting: Internal Medicine

## 2021-11-04 ENCOUNTER — Telehealth: Payer: Self-pay

## 2021-11-04 ENCOUNTER — Other Ambulatory Visit: Payer: Self-pay

## 2021-11-04 ENCOUNTER — Encounter: Payer: Self-pay | Admitting: Internal Medicine

## 2021-11-04 VITALS — BP 110/78 | HR 92 | Temp 97.9°F | Ht 69.0 in

## 2021-11-04 DIAGNOSIS — T466X5A Adverse effect of antihyperlipidemic and antiarteriosclerotic drugs, initial encounter: Secondary | ICD-10-CM | POA: Diagnosis not present

## 2021-11-04 DIAGNOSIS — E119 Type 2 diabetes mellitus without complications: Secondary | ICD-10-CM

## 2021-11-04 DIAGNOSIS — M791 Myalgia, unspecified site: Secondary | ICD-10-CM | POA: Diagnosis not present

## 2021-11-04 LAB — POCT GLYCOSYLATED HEMOGLOBIN (HGB A1C): Hemoglobin A1C: 6.7 % — AB (ref 4.0–5.6)

## 2021-11-04 MED ORDER — METFORMIN HCL ER 500 MG PO TB24: 1000.0000 mg | ORAL_TABLET | Freq: Every day | ORAL | 11 refills | Status: DC

## 2021-11-04 NOTE — Telephone Encounter (Signed)
Okay--we will review all her issues at the visit

## 2021-11-04 NOTE — Progress Notes (Signed)
Subjective:    Patient ID: Emily Moon, female    DOB: [birth year], 60 y.o.   MRN: [id number]  HPI Here due to concerns about diabetes control  Took repatha after one week by mistake Mixed it up with the trulicity Did check with poison control--no action needed On this for strong FH of high cholesterol---she hasn't had vascular event  Concerned about her elevated sugars Has CGM----average 130 but tends to run high after the AM  Was on metformin in the past Was concerned about potential side effects  Current Outpatient Medications on File Prior to Visit  Medication Sig Dispense Refill   Azelastine HCl 137 MCG/SPRAY SOLN PLACE 2 SPRAYS INTO BOTH NOSTRILS 2 (TWO) TIMES DAILY 30 mL 5   B Complex-Biotin-FA (BIG 100, BIOTIN, PO) Take by mouth.     calcipotriene-betamethasone (TACLONEX) ointment Apply topically daily. 60 g 0   cetirizine (ZYRTEC) 10 MG tablet Take 10 mg by mouth daily.     clobetasol (OLUX) 0.05 % topical foam Apply to affected areas at arms, legs, trunk twice a day up to 2 weeks. Avoid applying to face, groin, and axilla. Use as directed. Risk of skin atrophy with long-term use reviewed. 50 g 0   clobetasol (TEMOVATE) 0.05 % external solution Mix clobetasol solution with  CeraVe cream Use twice daily to affected areas.Avoid Face, groin and underarm 50 mL 0   Continuous Blood Gluc Sensor (FREESTYLE LIBRE 14 DAY SENSOR) MISC PLACE INTO SKIN FOR CONTINUOUS CHECK OF BLOOD SUGAR DAILY 6 each 2   desipramine (NORPRAMIN) 25 MG tablet Take 1 tablet (25 mg total) by mouth daily. 30 tablet 6   diphenhydrAMINE (BENADRYL) 2 % cream Apply topically 3 (three) times daily as needed for itching.     Dulaglutide (TRULICITY) 3 WU/9.8JX SOPN Inject 3 mg as directed once a week. For diabetes. 2 mL 0   DULoxetine (CYMBALTA) 20 MG capsule Take 2 capsules (40 mg total) by mouth daily. 180 capsule 2   Evolocumab (REPATHA SURECLICK) 914 MG/ML SOAJ Inject 1 Dose into the skin every 14  (fourteen) days. 2 mL 3   famotidine (PEPCID) 20 MG tablet Take 1 tablet (20 mg total) by mouth 2 (two) times daily. 180 tablet 3   fenofibrate (TRICOR) 145 MG tablet Take 1 tablet (145 mg total) by mouth daily. 30 tablet 2   fluocinonide (LIDEX) 0.05 % external solution Apply 1 application topically 2 (two) times daily as needed. Apply to scalp twice daily as need for itch 180 mL 3   hydrocortisone 2.5 % cream Apply 1 application topically 2 (two) times daily as needed (underarm/groin rash).     hydrOXYzine (VISTARIL) 50 MG capsule TAKE 1 CAPSLE BY MOUTH AT NOON, 1 AT 6 PM, AND 1 AS NEEDED (Patient taking differently: Take 100 mg by mouth at bedtime. TAKE 1 CAPSLE BY MOUTH AT NOON, 1 AT 6 PM, AND 1 AS NEEDED) 270 capsule 2   lamoTRIgine (LAMICTAL) 150 MG tablet Take 2 tablets (300 mg total) by mouth at bedtime. 180 tablet 1   levalbuterol (XOPENEX HFA) 45 MCG/ACT inhaler INHALE 1 TO 2 PUFFS BY MOUTH EVERY 6 HOURS AS NEEDED FOR WHEEZE 1 each 0   Lysine 500 MG TABS Take by mouth.     meclizine (ANTIVERT) 25 MG tablet Take 0.5-1 tablets (12.5-25 mg total) by mouth 3 (three) times daily as needed for dizziness (sedation caution). 30 tablet 0   methocarbamol (ROBAXIN) 500 MG tablet TAKE 1-2 TABLETS (500-1,000 MG  TOTAL) BY MOUTH AT BEDTIME AS NEEDED FOR MUSCLE SPASMS. 180 tablet 0   metoprolol tartrate (LOPRESSOR) 50 MG tablet Take 1 tablet (50 mg total) by mouth 2 (two) times daily. 180 tablet 3   neomycin-bacitracin-polymyxin (NEOSPORIN) [phone] ointment Apply 1 application topically 4 (four) times daily as needed (for cut/scrapes.).     pantoprazole (PROTONIX) 40 MG tablet Take 1 tablet (40 mg total) by mouth daily. For heartburn 90 tablet 3   polyethylene glycol powder (GLYCOLAX/MIRALAX) powder MIX 17 GRAMS (1 CAPFUL) WITH 4-8 OZ OF LIQUID AND TAKE BY MOUTH TWICE DAILY AS NEEDED 527 g 0   polyvinyl alcohol (LIQUIFILM TEARS) 1.4 % ophthalmic solution Place 2 drops into both eyes at bedtime.      temazepam (RESTORIL) 30 MG capsule TAKE ONE TO TWO CAPSULES BY MOUTH NIGHTLY AT BEDTIME 60 capsule 1   traZODone (DESYREL) 50 MG tablet Take 2 tablets (100 mg total) by mouth at bedtime. 60 tablet 7   valACYclovir (VALTREX) 1000 MG tablet TAKE 1 TABLET (1,000 MG TOTAL) BY MOUTH DAILY. FOR HERPES OUTBREAK PREVENTION 90 tablet 0   Dimethyl Fumarate 120 & 240 MG MISC  (Patient not taking: Reported on 11/04/2021)     No current facility-administered medications on file prior to visit.    Allergies  Allergen Reactions   Albuterol Shortness Of Breath and Other (See Comments)    Makes pt feel jittery/ tacycardic   Crestor [Rosuvastatin] Other (See Comments)    Joint pain, muscle pain, and hair loss   Halcion [Triazolam] Other (See Comments)    Dizziness,headaches,bladder problems   Levaquin [Levofloxacin In D5w] Diarrhea and Itching    Shoulder pain   Naproxen Sodium Swelling    Patient tolerates in small doses   Tylenol [Acetaminophen] Swelling    Patient tolerates in small doses   Cefaclor Other (See Comments)    Doesn't remember---unsure if actually allergic    Sulfa Antibiotics Itching    Unsure of reaction possibly itching   Tramadol Itching and Nausea And Vomiting   Zinc Other (See Comments)    constipation  constipation    Aripiprazole Other (See Comments)    Muscle tension/cramping   Diclofenac Sodium Rash    "made very sick"   Ibuprofen Swelling    Patient tolerates in small doses    Past Medical History:  Diagnosis Date   Arthritis    osteo   Asthma    Bipolar disorder ([place]) 05/21/2014   Cataracts, bilateral    COPD (chronic obstructive pulmonary disease) ([place])    DDD (degenerative disc disease), cervical    also back   Depression    Diabetes mellitus without complication (HCC)    Dizziness    Positional   Edema    feet/legs   Fibromyalgia syndrome    Fungal infection    Finger nails   GERD (gastroesophageal reflux disease)    Gout    Heart palpitations     Hip dysplasia, congenital 09/15/2013   History of kidney stones    Hypercholesterolemia    Multiple sclerosis ([place])    weakness   Osteoporosis    osteoarthritis   Pneumonia    PONV (postoperative nausea and vomiting)    no problem after cataract surgery   Prediabetes 07/22/2018   Psoriasis    Shortness of breath dyspnea    wheezing   Sleep apnea 2012   sleep study / slight, no interventions   Urinary frequency    Weight gain 06/21/2014  Past Surgical History:  Procedure Laterality Date   CATARACT EXTRACTION W/PHACO Left 05/21/2015   Procedure: CATARACT EXTRACTION PHACO AND INTRAOCULAR LENS PLACEMENT ([facility]);  Surgeon: Birder Robson, MD;  Location: [facility];  Service: Ophthalmology;  Laterality: Left;  [place] 00:35 AP% 22.9 CDE 8.11 fluid pack lot #[id number]   CATARACT EXTRACTION W/PHACO Right 06/04/2015   Procedure: CATARACT EXTRACTION PHACO AND INTRAOCULAR LENS PLACEMENT ([facility]);  Surgeon: Birder Robson, MD;  Location: [facility];  Service: Ophthalmology;  Laterality: Right;  US:00:48 AP%: 10.5 CDE:5.08 Fluid lot #[id number] H   CYSTOSCOPY/URETEROSCOPY/HOLMIUM LASER/STENT PLACEMENT Bilateral 09/22/2016   Procedure: CYSTOSCOPY/URETEROSCOPY/HOLMIUM LASER/STENT PLACEMENT;  Surgeon: Hollice Espy, MD;  Location: [facility];  Service: Urology;  Laterality: Bilateral;   EYE SURGERY  2015   tissue biopsy   FOOT SURGERY  2015   JOINT REPLACEMENT Left 2013   hip replacement   LITHOTRIPSY     PTOSIS REPAIR Bilateral 02/18/2016   Procedure: BILATERAL PTOSIS REPAIR UPPER EYELIDS;  Surgeon: Karle Starch, MD;  Location: [facility];  Service: Ophthalmology;  Laterality: Bilateral;  LEAVE PT EARLY AM   thumb surgery Right    TONSILLECTOMY  1973   TOTAL HIP ARTHROPLASTY Right 07/02/2021   Procedure: TOTAL HIP ARTHROPLASTY ANTERIOR APPROACH;  Surgeon: Gaynelle Arabian, MD;  Location: [facility];  Service: Orthopedics;  Laterality: Right;    Family History  Problem Relation Age of Onset    Cancer Father        Abdomen with mastasis   Cancer Mother    Heart disease Mother    Kidney disease Neg Hx    Bladder Cancer Neg Hx    Prostate cancer Neg Hx    Kidney cancer Neg Hx     Social History   Socioeconomic History   Marital status: Divorced    Spouse name: Not on file   Number of children: 1   Years of education: Not on file   Highest education level: Not on file  Occupational History   Occupation: Therapist, 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  Tobacco Use   Smoking status: Former    Packs/day: 1.00    Years: 25.00    Pack years: 25.00    Types: Cigarettes    Quit date: 2019    Years since quitting: 3.9   Smokeless tobacco: Never   Tobacco comments:    occasional use  Vaping Use   Vaping Use: Former  Substance and Sexual Activity   Alcohol use: No    Alcohol/week: 0.0 standard drinks   Drug use: Yes    Types: Methylphenidate, Marijuana    Comment: Delta 8 - once per day   Sexual activity: Never  Other Topics Concern   Not on file  Social History Narrative   Not on file   Social Determinants of Health   Financial Resource Strain: Low Risk    Difficulty of Paying Living Expenses: Not hard at all  Food Insecurity: No Food Insecurity   Worried About Charity fundraiser in the Last Year: Never true   [place] in the Last Year: Never true  Transportation Needs: No Transportation Needs   Lack of Transportation (Medical): No   Lack of Transportation (Non-Medical): No  Physical Activity: Inactive   Days of Exercise per Week: 0 days   Minutes of Exercise per Session: 0 min  Stress: No Stress Concern Present   Feeling of Stress : Not at all  Social Connections: Not on file  Intimate Partner Violence: Not At Risk   Fear of Current or Ex-Partner: No   Emotionally Abused: No   Physically Abused: No   Sexually Abused: No   Review of Systems Hasn't lost weight on trulicity Isn't hungry--but then will eat a fair bit when she gets  started     Objective:   Physical Exam Constitutional:      Appearance: Normal appearance.  Neurological:     Mental Status: She is alert.  Psychiatric:        Mood and Affect: Mood normal.        Behavior: Behavior normal.           Assessment & Plan:

## 2021-11-04 NOTE — Assessment & Plan Note (Signed)
Lab Results  Component Value Date   HGBA1C 6.7 (A) 11/04/2021   Still with good control on just the trulicity She is concerned about higher sugars later in the day Will add daily metformin Discussed SGLT-2 inhibitors also--will hold off

## 2021-11-04 NOTE — Telephone Encounter (Signed)
[name] - Client TELEPHONE ADVICE RECORD [facility] Patient Name: Emily Moon Gender: Female DOB: [birth year] Age: 60 Y 19 M 26 D Return Phone Number: [phone] (Primary) Address: City/ State/ Zip: [place]  [address] Client [name] - Client Client Site [place] - Night Provider [name] - NP Contact Type Call Who Is Calling Patient / Member / Family / Caregiver Call Type Triage / Clinical Relationship To Patient Self Return Phone Number [phone] (Primary) Chief Complaint OVERDOSE took too much medication at once Reason for Call Symptomatic / Request for [name] states she take Repatha every 2 week and Trulicity every week. She got them mixed up and accidently took the [place] again and wants to know what will happen. [place] number given Translation No Nurse Assessment Nurse: Gilford Rile, RN, Ginny Date/Time (Eastern Time): 11/03/2021 6:52:35 PM Confirm and document reason for call. If symptomatic, describe symptoms. ---Caller states she take Repatha every 2 week and Trulicity every week. She got them mixed up and accidently took the [place] again and wants to know what will happen. Does the patient have any new or worsening symptoms? ---Yes Will a triage be completed? ---Yes Related visit to physician within the last 2 weeks? ---No Does the PT have any chronic conditions? (i.e. diabetes, asthma, this includes High risk factors for pregnancy, etc.) ---Yes List chronic conditions. ---Diabetes, Sleep apnea, MS, arthritis Is this a behavioral health or substance abuse call? ---No Guidelines Guideline Title Affirmed Question Affirmed Notes Nurse Date/Time (Eastern Time) Poisoning [1] DOUBLE DOSE (an extra dose or lesser amount) of prescription drug AND [2] any symptoms (e.g., Gilford Rile,  RN, Ginny 11/03/2021 6:54:33 PM PLEASE NOTE: All timestamps contained within this report are represented as [place] Standard Time. CONFIDENTIALTY NOTICE: This fax transmission is intended only for the addressee. It contains information that is legally privileged, confidential or otherwise protected from use or disclosure. If you are not the intended recipient, you are strictly prohibited from reviewing, disclosing, copying using or disseminating any of this information or taking any action in reliance on or regarding this information. If you have received this fax in error, please notify us immediately by telephone so that we can arrange for its return to [place]. Phone: [phone], Toll-Free: [phone], Fax: [phone] Page: 2 of 2 Call Id: [id number] Guidelines Guideline Title Affirmed Question Affirmed Notes Nurse Date/Time Eilene Ghazi Time) dizziness, nausea, pain, sleepiness) Disp. Time Eilene Ghazi Time) Disposition Final User 11/03/2021 6:48:16 PM Send to Urgent Tarri Glenn 11/03/2021 6:57:40 PM Call [place] Now Yes Gilford Rile, RN, Donia Guiles Caller Disagree/Comply Comply Caller Understands Yes PreDisposition Call Doctor Care Advice Given Per Guideline [place] NOW: * You need to call the [facility] now. [facility] advice is a free service. [place] NUMBER: [place]: [place] given per Poisoning (Adult) guideline. Referrals GO TO FACILITY OTHER - SPECIF

## 2021-11-04 NOTE — Telephone Encounter (Signed)
I spoke with pt and she did speak with poison control and pt said she is OK due to taking a small dosage she is OK. Pt does plan to keep appt today with Dr Silvio Pate at 10:15.Sending note to Dr Silvio Pate and Larene Beach.

## 2021-11-04 NOTE — Patient Instructions (Signed)
Please add metformin 500mg  with your first meal of the day. If your sugars stay up, and you have no problems with it, you can increase to 1000mg  daily.

## 2021-11-04 NOTE — Assessment & Plan Note (Signed)
Has been unable to tolerate--so now on repatha

## 2021-11-12 ENCOUNTER — Telehealth: Payer: Self-pay

## 2021-11-12 NOTE — Progress Notes (Signed)
Returned call to the patient and she is down to last Trulicity  3mg . [employer]  [phone] contacted and verbal authorization for refills given. The pharmacy will send out to the patient .  Marjo Bicker, CPP notified  Avel Sensor, [place] Assistant [phone]  Total time spent for month CPA: 20 min.s

## 2021-11-17 ENCOUNTER — Telehealth: Payer: Self-pay

## 2021-11-17 NOTE — Telephone Encounter (Signed)
Patient called and inquired about PA for Tecfidera (dimethyl fumarate). Reports this was prescribed by Dr. Melrose Nakayama for MS 09/2021. She has not been able to get this approved through insurance. [facility] is using [facility]. She keeps getting rejection letters and is not sure what to do.

## 2021-11-17 NOTE — Telephone Encounter (Signed)
Patient called, she would like an estimated delivery date on the Trulicity.

## 2021-11-18 ENCOUNTER — Other Ambulatory Visit: Payer: Self-pay

## 2021-11-18 ENCOUNTER — Encounter: Payer: Self-pay | Admitting: Dermatology

## 2021-11-18 ENCOUNTER — Ambulatory Visit: Payer: PPO | Admitting: Dermatology

## 2021-11-18 DIAGNOSIS — L308 Other specified dermatitis: Secondary | ICD-10-CM

## 2021-11-18 DIAGNOSIS — L649 Androgenic alopecia, unspecified: Secondary | ICD-10-CM

## 2021-11-18 DIAGNOSIS — L739 Follicular disorder, unspecified: Secondary | ICD-10-CM

## 2021-11-18 DIAGNOSIS — L720 Epidermal cyst: Secondary | ICD-10-CM | POA: Diagnosis not present

## 2021-11-18 MED ORDER — CLINDAMYCIN PHOSPHATE 1 % EX SOLN: CUTANEOUS | 2 refills | Status: AC

## 2021-11-18 NOTE — Telephone Encounter (Addendum)
Contacted [employer] and delivery date for Trulicity will be 78/67/54 delivered to her home address.  Debbora Dus, CPP notified  Avel Sensor, [place] Assistant [phone]

## 2021-11-18 NOTE — Patient Instructions (Addendum)
Pre-Operative Instructions  You are scheduled for a surgical procedure at [facility]. We recommend you read the following instructions. If you have any questions or concerns, please call the office at [phone].  Shower and wash the entire body with soap and water the day of your surgery paying special attention to cleansing at and around the planned surgery site.  Avoid aspirin or aspirin containing products at least fourteen (14) days prior to your surgical procedure and for at least one week (7 Days) after your surgical procedure. If you take aspirin on a regular basis for heart disease or history of stroke or for any other reason, we may recommend you continue taking aspirin but please notify us if you take this on a regular basis. Aspirin can cause more bleeding to occur during surgery as well as prolonged bleeding and bruising after surgery.   Avoid other nonsteroidal pain medications at least one week prior to surgery and at least one week prior to your surgery. These include medications such as Ibuprofen (Motrin, Advil and Nuprin), Naprosyn, Voltaren, Relafen, etc. If medications are used for therapeutic reasons, please inform us as they can cause increased bleeding or prolonged bleeding during and bruising after surgical procedures.   Please advise [place] if you are taking any "blood thinner" medications such as Coumadin or Dipyridamole or Plavix or similar medications. These cause increased bleeding and prolonged bleeding during procedures and bruising after surgical procedures. We may have to consider discontinuing these medications briefly prior to and shortly after your surgery if safe to do so.   Please inform us of all medications you are currently taking. All medications that are taken regularly should be taken the day of surgery as you always do. Nevertheless, we need to be informed of what medications you are taking prior to surgery to know whether they will affect the  procedure or cause any complications.   Please inform us of any medication allergies. Also inform us of whether you have allergies to Latex or rubber products or whether you have had any adverse reaction to Lidocaine or Epinephrine.  Please inform us of any prosthetic or artificial body parts such as artificial heart valve, joint replacements, etc., or similar condition that might require preoperative antibiotics.   We recommend avoidance of alcohol at least two weeks prior to surgery and continued avoidance for at least two weeks after surgery.   We recommend discontinuation of tobacco smoking at least two weeks prior to surgery and continued abstinence for at least two weeks after surgery.  Do not plan strenuous exercise, strenuous work or strenuous lifting for approximately four weeks after your surgery.   We request if you are unable to make your scheduled surgical appointment, please call us at least a week in advance or as soon as you are aware of a problem so that we can cancel or reschedule the appointment.   You MAY TAKE TYLENOL (acetaminophen) for pain as it is not a blood thinner.   PLEASE PLAN TO BE IN TOWN FOR TWO WEEKS FOLLOWING SURGERY, THIS IS IMPORTANT SO YOU CAN BE CHECKED FOR DRESSING CHANGES, SUTURE REMOVAL AND TO MONITOR FOR POSSIBLE COMPLICATIONS.      Recommend minoxidil 5% (Rogaine for men) solution or foam to be applied to the scalp and left in. This should ideally be used twice daily for best results but it helps with hair regrowth when used at least three times per week. Rogaine initially can cause increased hair shedding for the  first few weeks but this will stop with continued use. In studies, people who used minoxidil (Rogaine) for at least 6 months had thicker hair than people who did not. Minoxidil topical (Rogaine) only works as long as it continues to be used. If if it is no longer used then the hair it has been helping to regrow can fall out. Minoxidil topical  (Rogaine) can cause increased facial hair growth which can usually be managed easily with a battery-operated hair trimmer. If facial hair growth is bothersome, switching to the 2% women's version can decrease the risk of unwanted facial hair growth.   Recommend SEEN Shampoo & Conditioner   If You Need Anything After Your Visit  If you have any questions or concerns for your doctor, please call our main line at [phone] and press option 4 to reach your doctor's medical assistant. If no one answers, please leave a voicemail as directed and we will return your call as soon as possible. Messages left after 4 pm will be answered the following business day.   You may also send [place] a message via [place]. We typically respond to MyChart messages within 1-2 business days.  For prescription refills, please ask your pharmacy to contact our office. Our fax number is [phone].  If you have an urgent issue when the clinic is closed that cannot wait until the next business day, you can page your doctor at the number below.    Please note that while we do our best to be available for urgent issues outside of office hours, we are not available 24/7.   If you have an urgent issue and are unable to reach [place], you may choose to seek medical care at your doctor's office, retail clinic, urgent care center, or emergency room.  If you have a medical emergency, please immediately call 911 or go to the emergency department.  Pager Numbers  - Dr. Nehemiah Massed: [phone]  - Dr. Laurence Ferrari: [phone]  - Dr. Nicole Kindred: [phone]  In the event of inclement weather, please call our main line at [phone] for an update on the status of any delays or closures.  Dermatology Medication Tips: Please keep the boxes that topical medications come in in order to help keep track of the instructions about where and how to use these. Pharmacies typically print the medication instructions only on the boxes and not directly  on the medication tubes.   If your medication is too expensive, please contact our office at [phone] option 4 or send [place] a message through [place].   We are unable to tell what your co-pay for medications will be in advance as this is different depending on your insurance coverage. However, we may be able to find a substitute medication at lower cost or fill out paperwork to get insurance to cover a needed medication.   If a prior authorization is required to get your medication covered by your insurance company, please allow [place] 1-2 business days to complete this process.  Drug prices often vary depending on where the prescription is filled and some pharmacies may offer cheaper prices.  The website www.goodrx.com contains coupons for medications through different pharmacies. The prices here do not account for what the cost may be with help from insurance (it may be cheaper with your insurance), but the website can give you the price if you did not use any insurance.  - You can print the associated coupon and take it with your prescription to the pharmacy.  -  You may also stop by our office during regular business hours and pick up a GoodRx coupon card.  - If you need your prescription sent electronically to a different pharmacy, notify our office through [facility] or by phone at [phone] option 4.     Si Usted Necesita Algo Despus de Su Visita  Tambin puede enviarnos un mensaje a travs de Pharmacist, community. Por lo general respondemos a los mensajes de MyChart en el transcurso de 1 a 2 das hbiles.  Para renovar recetas, por favor pida a su farmacia que se ponga en contacto con nuestra oficina. Harland Dingwall de fax es [place] [phone].  Si tiene un asunto urgente cuando la clnica est cerrada y que no puede esperar hasta el siguiente da hbil, puede llamar/localizar a su doctor(a) al nmero que aparece a continuacin.   Por favor, tenga en cuenta que aunque hacemos todo lo  posible para estar disponibles para asuntos urgentes fuera del horario de [place], no estamos disponibles las 24 horas del da, los 7 das de la [place].   Si tiene un problema urgente y no puede comunicarse con nosotros, puede optar por buscar atencin mdica  en el consultorio de su doctor(a), en una clnica privada, en un centro de atencin urgente o en una sala de emergencias.  Si tiene Engineering geologist, por favor llame inmediatamente al 911 o vaya a la sala de emergencias.  Nmeros de bper  - Dr. Nehemiah Massed: [phone]  - Dra. Moye: [phone]  - Dra. Nicole Kindred: [phone]  En caso de inclemencias del [place], por favor llame a Johnsie Kindred principal al [phone] para una actualizacin sobre el [place] de cualquier retraso o cierre.  Consejos para la medicacin en dermatologa: Por favor, guarde las cajas en las que vienen los medicamentos de uso tpico para ayudarle a seguir las instrucciones sobre dnde y cmo usarlos. Las farmacias generalmente imprimen las instrucciones del medicamento slo en las cajas y no directamente en los tubos del [place].   Si su medicamento es muy caro, por favor, pngase en contacto con Zigmund Daniel llamando al [phone] y presione la opcin 4 o envenos un mensaje a travs de Pharmacist, community.   No podemos decirle cul ser su copago por los medicamentos por adelantado ya que esto es diferente dependiendo de la cobertura de su seguro. Sin embargo, es posible que podamos encontrar un medicamento sustituto a Electrical engineer un formulario para que el seguro cubra el medicamento que se considera necesario.   Si se requiere una autorizacin previa para que su compaa de seguros [place] su medicamento, por favor permtanos de 1 a 2 das hbiles para completar este proceso.  Los precios de los medicamentos varan con frecuencia dependiendo del Environmental consultant de dnde se surte la receta y alguna farmacias pueden ofrecer precios ms baratos.  El sitio web  www.goodrx.com tiene cupones para medicamentos de Airline pilot. Los precios aqu no tienen en cuenta lo que podra costar con la ayuda del seguro (puede ser ms barato con su seguro), pero el sitio web puede darle el precio si no utiliz Research scientist (physical sciences).  - Puede imprimir el cupn correspondiente y llevarlo con su receta a la farmacia.  - Tambin puede pasar por nuestra oficina durante el horario de atencin regular y Charity fundraiser una tarjeta de cupones de GoodRx.  - Si necesita que su receta se enve electrnicamente a Chiropodist, informe a nuestra oficina a travs de MyChart de [place] o por telfono llamando al [phone] y  presione la opcin 4.

## 2021-11-18 NOTE — Progress Notes (Signed)
Follow-Up Visit   Subjective  Emily Moon is a 61 y.o. female who presents for the following: Rash (Check a itchy rash on the left wrist appeared a few days ago, ). Recheck a cyst on her back. Pt c/o bumpy rash on her scalp and hair loss.  Eczema on her body treating with clobetasol foam with a good response.   The following portions of the chart were reviewed this encounter and updated as appropriate:   Tobacco   Allergies   Meds   Problems   Med Hx   Surg Hx   Fam Hx       Review of Systems:  No other skin or systemic complaints except as noted in HPI or Assessment and Plan.  Objective  Well appearing patient in no apparent distress; mood and affect are within normal limits.  A focused examination was performed including face,scalp,arms,back,chest. Relevant physical exam findings are noted in the Assessment and Plan.  Left Forearm Scaly pink papules coalescing to plaques   Scalp Excoriated perifollicular papules  Scalp Diffuse thinning of the crown and widening of the midline part with retention of the frontal hairline - Reviewed progressive nature and prognosis.   Negative hair pull test  mid back 0.8 cm Subcutaneous nodule.     Assessment & Plan  Other eczema Left Forearm  Chronic condition with duration or expected duration over one year. Condition is bothersome to patient. Not currently at goal.  Atopic dermatitis (eczema) is a chronic, relapsing, pruritic condition that can significantly affect quality of life. It is often associated with allergic rhinitis and/or asthma and can require treatment with topical medications, phototherapy, or in severe cases biologic injectable medication (Dupixent; Adbry) or Oral JAK inhibitors.   Start Clobetasol foam apply to skin once or twice a day prn up to 2 weeks. Avoid applying to face, groin, and axilla. Use as directed. Long-term use can cause thinning of the skin.  Topical steroids (such as triamcinolone,  fluocinolone, fluocinonide, mometasone, clobetasol, halobetasol, betamethasone, hydrocortisone) can cause thinning and lightening of the skin if they are used for too long in the same area. Your physician has selected the right strength medicine for your problem and area affected on the body. Please use your medication only as directed by your physician to prevent side effects.    Related Medications clobetasol (OLUX) 0.05 % topical foam Apply to affected areas at arms, legs, trunk twice a day up to 2 weeks. Avoid applying to face, groin, and axilla. Use as directed. Risk of skin atrophy with long-term use reviewed.  Folliculitis Scalp  Chronic condition with duration or expected duration over one year. Condition is bothersome to patient. Currently flared.  Start Clindamycin solution apply to bumps on the scalp once or twice a day as needed  Start Clobetasol foam apply to scalp qd prn itch  Topical steroids (such as triamcinolone, fluocinolone, fluocinonide, mometasone, clobetasol, halobetasol, betamethasone, hydrocortisone) can cause thinning and lightening of the skin if they are used for too long in the same area. Your physician has selected the right strength medicine for your problem and area affected on the body. Please use your medication only as directed by your physician to prevent side effects.    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 Condition is bothersome to patient. Currently flared.  Start Recommend minoxidil  5% (Rogaine for men) solution or foam to be applied to the scalp and left in. This should ideally be used twice daily for best results but it helps with hair regrowth when used at least three times per week. Rogaine initially can cause increased hair shedding for the first few weeks but this will stop  with continued use. In studies, people who used minoxidil (Rogaine) for at least 6 months had thicker hair than people who did not. Minoxidil topical (Rogaine) only works as long as it continues to be used. If if it is no longer used then the hair it has been helping to regrow can fall out. Minoxidil topical (Rogaine) can cause increased facial hair growth which can usually be managed easily with a battery-operated hair trimmer. If facial hair growth is bothersome, switching to the 2% women's version can decrease the risk of unwanted facial hair growth.   Recommend SEEN Shampoo & Conditioner    Epidermal cyst mid back  Cyst with symptoms and/or recent change.  Discussed surgical excision to remove, including resulting scar and possible recurrence.  Patient will schedule for surgery. Pre-op information given.    Return for cyst surgery . Recheck scalp and eczema in 3 months.  I, Marye Round, CMA, am acting as scribe for Forest Gleason, MD .   Documentation: I have reviewed the above documentation for accuracy and completeness, and I agree with the above.  Forest Gleason, MD

## 2021-11-18 NOTE — Telephone Encounter (Signed)
[facility] was given the generic starter trial pack of dimethyl fumarate. Pharmacist explained that this [facility] not [facility] that drug. The Pharmacist is waiting a patient call to see if patient wants to continue the medication. He states that once established the specialty pharmacy will either send to patients address or send to local [facility] for patient pick up. Emily Moon has not picked up trial medication and I called her and explained to her to call Dr.Potters office and discuss assistance, or alternative drug if needed, Emily Moon states its hard to get to [place].   Debbora Dus, CPP notified  Avel Sensor, [place] Assistant [phone]  Total time spent for month CPA: 10 min.s

## 2021-11-18 NOTE — Telephone Encounter (Signed)
This is a specialty med prescribed by neurologist Dr Melrose Nakayama, all prior authorization inquiries have to be handled by prescriber. According to the [employer] insurance formulary the generic is a Tier 5 drug (likely very expensive even though technically covered), the brand-name drug is not covered.   [facility] and see if [facility] are running it for generic or brand name. It is likely that the copay is very expensive anyway, if pt cannot afford the copay she will need to contact Dr Lannie Fields office for an alternative (there is no patient assistance for generic drugs).

## 2021-11-19 ENCOUNTER — Other Ambulatory Visit (HOSPITAL_COMMUNITY): Payer: Self-pay | Admitting: Psychiatry

## 2021-11-19 DIAGNOSIS — F310 Bipolar disorder, current episode hypomanic: Secondary | ICD-10-CM

## 2021-11-28 ENCOUNTER — Telehealth: Payer: Self-pay

## 2021-11-28 NOTE — Progress Notes (Signed)
Chronic Care Management Pharmacy Assistant   Name: Emily Moon  MRN: [id number] DOB: 07/14/1961  Reason for Encounter: CCM (Appointment Reminder  Medications: Outpatient Encounter Medications as of 11/28/2021  Medication Sig   Azelastine HCl 137 MCG/SPRAY SOLN PLACE 2 SPRAYS INTO BOTH NOSTRILS 2 (TWO) TIMES DAILY   B Complex-Biotin-FA (BIG 100, BIOTIN, PO) Take by mouth.   calcipotriene-betamethasone (TACLONEX) ointment Apply topically daily.   cetirizine (ZYRTEC) 10 MG tablet Take 10 mg by mouth daily.   clindamycin (CLEOCIN T) 1 % external solution Apply to scalp once or twice a day prn bumps   clobetasol (OLUX) 0.05 % topical foam Apply to affected areas at arms, legs, trunk twice a day up to 2 weeks. Avoid applying to face, groin, and axilla. Use as directed. Risk of skin atrophy with long-term use reviewed.   clobetasol (TEMOVATE) 0.05 % external solution Mix clobetasol solution with  CeraVe cream Use twice daily to affected areas.Avoid Face, groin and underarm   Continuous Blood Gluc Sensor (FREESTYLE LIBRE 14 DAY SENSOR) MISC PLACE INTO SKIN FOR CONTINUOUS CHECK OF BLOOD SUGAR DAILY   desipramine (NORPRAMIN) 25 MG tablet Take 1 tablet (25 mg total) by mouth daily.   Dimethyl Fumarate 120 & 240 MG MISC  (Patient not taking: Reported on 11/04/2021)   diphenhydrAMINE (BENADRYL) 2 % cream Apply topically 3 (three) times daily as needed for itching.   Dulaglutide (TRULICITY) 3 MW/4.1LK SOPN Inject 3 mg as directed once a week. For diabetes.   DULoxetine (CYMBALTA) 20 MG capsule Take 2 capsules (40 mg total) by mouth daily.   Evolocumab (REPATHA SURECLICK) 440 MG/ML SOAJ Inject 1 Dose into the skin every 14 (fourteen) days.   famotidine (PEPCID) 20 MG tablet Take 1 tablet (20 mg total) by mouth 2 (two) times daily.   fenofibrate (TRICOR) 145 MG tablet Take 1 tablet (145 mg total) by mouth daily.   fluocinonide (LIDEX) 0.05 % external solution Apply 1 application topically 2 (two)  times daily as needed. Apply to scalp twice daily as need for itch   hydrocortisone 2.5 % cream Apply 1 application topically 2 (two) times daily as needed (underarm/groin rash).   hydrOXYzine (VISTARIL) 50 MG capsule TAKE 1 CAPSLE BY MOUTH AT NOON, 1 AT 6 PM, AND 1 AS NEEDED (Patient taking differently: Take 100 mg by mouth at bedtime. TAKE 1 CAPSLE BY MOUTH AT NOON, 1 AT 6 PM, AND 1 AS NEEDED)   lamoTRIgine (LAMICTAL) 150 MG tablet Take 2 tablets (300 mg total) by mouth at bedtime.   levalbuterol (XOPENEX HFA) 45 MCG/ACT inhaler INHALE 1 TO 2 PUFFS BY MOUTH EVERY 6 HOURS AS NEEDED FOR WHEEZE   Lysine 500 MG TABS Take by mouth.   meclizine (ANTIVERT) 25 MG tablet Take 0.5-1 tablets (12.5-25 mg total) by mouth 3 (three) times daily as needed for dizziness (sedation caution).   metFORMIN (GLUCOPHAGE-XR) 500 MG 24 hr tablet Take 2 tablets (1,000 mg total) by mouth daily with breakfast.   methocarbamol (ROBAXIN) 500 MG tablet TAKE 1-2 TABLETS (500-1,000 MG TOTAL) BY MOUTH AT BEDTIME AS NEEDED FOR MUSCLE SPASMS.   metoprolol tartrate (LOPRESSOR) 50 MG tablet Take 1 tablet (50 mg total) by mouth 2 (two) times daily.   neomycin-bacitracin-polymyxin (NEOSPORIN) [phone] ointment Apply 1 application topically 4 (four) times daily as needed (for cut/scrapes.).   pantoprazole (PROTONIX) 40 MG tablet Take 1 tablet (40 mg total) by mouth daily. For heartburn   polyethylene glycol powder (GLYCOLAX/MIRALAX) powder MIX 17  GRAMS (1 CAPFUL) WITH 4-8 OZ OF LIQUID AND TAKE BY MOUTH TWICE DAILY AS NEEDED   polyvinyl alcohol (LIQUIFILM TEARS) 1.4 % ophthalmic solution Place 2 drops into both eyes at bedtime.   temazepam (RESTORIL) 30 MG capsule TAKE ONE TO TWO CAPSULES BY MOUTH NIGHTLY AT BEDTIME   traZODone (DESYREL) 50 MG tablet Take 2 tablets (100 mg total) by mouth at bedtime.   valACYclovir (VALTREX) 1000 MG tablet TAKE 1 TABLET (1,000 MG TOTAL) BY MOUTH DAILY. FOR HERPES OUTBREAK PREVENTION   No  facility-administered encounter medications on file as of 11/28/2021.   Emily Moon was contacted to remind of upcoming telephone visit with Emily Moon on 12/05/2021 at 1:00 pm. Patient was reminded to have all medications, supplements and any blood glucose and blood pressure readings available for review at appointment. If unable to reach, a voicemail was left for patient.   Star Rating Drugs: Medication:  Last Fill: Day Supply Metformin 500 mg 65/01/5464 90 Trulicity 3mg /0.76ml 09/29/2021 Lynn, CPP notified  Emily Moon, [place] Pharmacy Assistant [phone]  Time Spent: 10 Minutes

## 2021-12-03 ENCOUNTER — Telehealth (HOSPITAL_BASED_OUTPATIENT_CLINIC_OR_DEPARTMENT_OTHER): Payer: PPO | Admitting: Psychiatry

## 2021-12-03 ENCOUNTER — Other Ambulatory Visit: Payer: Self-pay

## 2021-12-03 ENCOUNTER — Other Ambulatory Visit (HOSPITAL_COMMUNITY): Payer: Self-pay | Admitting: Psychiatry

## 2021-12-03 DIAGNOSIS — F339 Major depressive disorder, recurrent, unspecified: Secondary | ICD-10-CM

## 2021-12-03 DIAGNOSIS — G4733 Obstructive sleep apnea (adult) (pediatric): Secondary | ICD-10-CM | POA: Diagnosis not present

## 2021-12-03 DIAGNOSIS — F310 Bipolar disorder, current episode hypomanic: Secondary | ICD-10-CM

## 2021-12-03 MED ORDER — TRAZODONE HCL 50 MG PO TABS
100.0000 mg | ORAL_TABLET | Freq: Every day | ORAL | 7 refills | Status: DC
Start: 2021-12-03 — End: 2022-02-03

## 2021-12-03 MED ORDER — TEMAZEPAM 30 MG PO CAPS: ORAL_CAPSULE | ORAL | 4 refills | Status: DC

## 2021-12-03 MED ORDER — DULOXETINE HCL 20 MG PO CPEP: 40.0000 mg | ORAL_CAPSULE | Freq: Every day | ORAL | 2 refills | Status: DC

## 2021-12-03 MED ORDER — DESIPRAMINE HCL 25 MG PO TABS: 25.0000 mg | ORAL_TABLET | Freq: Every day | ORAL | 6 refills | Status: DC

## 2021-12-03 MED ORDER — HYDROXYZINE PAMOATE 50 MG PO CAPS: 100.0000 mg | ORAL_CAPSULE | Freq: Every day | ORAL | 3 refills | Status: DC

## 2021-12-03 MED ORDER — LAMOTRIGINE 150 MG PO TABS: 300.0000 mg | ORAL_TABLET | Freq: Every day | ORAL | 1 refills | Status: DC

## 2021-12-03 NOTE — Progress Notes (Addendum)
The most painful.  This  Psychiatric Initial Adult Assessment   Patient Identification: Emily Moon MRN:  [id number] Date of Evaluation:  12/03/2021 Referral Source: From the community Chief Complaint: Feels exhausted   Today with there was some confusion.  She was supposed to come in for the visit but did not.  I got to talk to her for about 10 minutes.  The patient is kind of miserable.  She is having a hard time getting out of her house.  Yet on the other hand she has a ticket to go to [place] on a plane to visit a friend.  And on the other hand she is doing some things for enjoyment but she says leaving the house is difficult.  Generally she seems to be sleeping and eating well and she denies any physical complaints.  She will continue taking all the medications prescribed.  She is not suicidal.  She denies any use of alcohol or drugs she denies any psychotic symptoms.  Virtual Visit via Telephone Note  I connected with Emily Moon on 08/03/23 at  1:00 PM EST by telephone and verified that I am speaking with the correct person using two identifiers.  Location: Patient: home Provider: office   I discussed the limitations, risks, security and privacy concerns of performing an evaluation and management service by telephone and the availability of in person appointments. I also discussed with the patient that there may be a patient responsible charge related to this service. The patient expressed understanding and agreed to proceed.  structions:    I discussed the assessment and treatment plan with the patient. The patient was provided an opportunity to ask questions and all were answered. The patient agreed with the plan and demonstrated an understanding of the instructions.   The patient was advised to call back or seek an in-person evaluation if the symptoms worsen or if the condition fails to improve as anticipated.  I provided 10 minutes of non-face-to-face time during  this encounter.   Gypsy Balsam, MD   Patient: Emily Moon  Procedure(s) Performed: * No surgery found *  Anesthes  Patient location:   Post pain:   Post assessment:   Last Vitals:  There were no vitals filed for this visit.  Post vital signs:   Level of consciousness:   Complications: . Associated Signs/Symptoms: Depression Symptoms:  hopelessness, (Hypo) Manic Symptoms:   Anxiety Symptoms:   Psychotic Symptoms:   PTSD Symptoms:   Past Psychiatric History: The patient was seen in a psychiatric office and takes multiple medications she's not been recently hospitalized.  Previous Psychotropic Medications: Yes   Substance Abuse History in the last 12 months:  No.  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   History of kidney stones    Hypercholesterolemia    Multiple  sclerosis (HCC)    weakness   Osteoporosis    osteoarthritis   Pneumonia    PONV (postoperative nausea and vomiting)    no problem after cataract surgery   Prediabetes 07/22/2018   Psoriasis    Shortness of breath dyspnea    wheezing   Sleep apnea 2012   sleep study / slight, no interventions   Urinary frequency    Weight gain 06/21/2014    Past Surgical History:  Procedure Laterality Date   CATARACT EXTRACTION W/PHACO Left 05/21/2015   Procedure: CATARACT EXTRACTION PHACO AND INTRAOCULAR LENS PLACEMENT (IOC);  Surgeon: Galen Manila, MD;  Location: [facility];  Service: Ophthalmology;  Laterality: Left;  [place] 00:35 AP% 22.9 CDE 8.11 fluid pack lot #[id number]   CATARACT EXTRACTION W/PHACO Right 06/04/2015   Procedure: CATARACT EXTRACTION PHACO AND INTRAOCULAR LENS PLACEMENT (IOC);  Surgeon: Galen Manila, MD;  Location: [facility];  Service: Ophthalmology;  Laterality: Right;  US:00:48 AP%: 10.5 CDE:5.08 Fluid lot #[id number]   CYSTOSCOPY/URETEROSCOPY/HOLMIUM LASER/STENT PLACEMENT Bilateral 09/22/2016   Procedure: CYSTOSCOPY/URETEROSCOPY/HOLMIUM LASER/STENT PLACEMENT;  Surgeon: Vanna Scotland, MD;  Location: [facility];  Service: Urology;  Laterality: Bilateral;   EYE SURGERY  2015   tissue biopsy   FOOT SURGERY  2015   JOINT REPLACEMENT Left 2013   hip replacement   LITHOTRIPSY     PTOSIS REPAIR Bilateral 02/18/2016   Procedure: BILATERAL PTOSIS REPAIR UPPER EYELIDS;  Surgeon: Imagene Riches, MD;  Location: [facility];  Service: Ophthalmology;  Laterality: Bilateral;  LEAVE PT EARLY AM   thumb surgery Right    TONSILLECTOMY  1973   TOTAL HIP ARTHROPLASTY Right 07/02/2021   Procedure: TOTAL HIP ARTHROPLASTY ANTERIOR APPROACH;  Surgeon: Ollen Gross, MD;  Location: [facility];  Service: Orthopedics;  Laterality: Right;    Family Psychiatric History:   Family History:  Family History  Problem Relation Age of Onset   Cancer Father        Abdomen with mastasis   Cancer Mother    Heart disease Mother    Kidney disease Neg Hx    Bladder Cancer Neg Hx    Prostate cancer Neg Hx    Kidney cancer Neg Hx     Social History:   Social History   Socioeconomic History   Marital status: Divorced    Spouse name: Not on file   Number of children: 1   Years of education: Not on file   Highest education level: Not on file  Occupational History   Occupation: Clinical biochemist Rep at [employer]    Employer: OTHER  Tobacco Use   Smoking status: Former    Packs/day: 1.00    Years: 25.00    Pack years: 25.00     Types: Cigarettes    Quit date: 2019    Years since quitting: 4.0   Smokeless tobacco: Never   Tobacco comments:    occasional use  Vaping Use   Vaping Use: Former  Substance and Sexual Activity   Alcohol use: No    Alcohol/week: 0.0 standard drinks   Drug use: Yes    Types: Methylphenidate, Marijuana    Comment: Delta 8 - once per day   Sexual activity: Never  Other Topics Concern   Not on file  Social History Narrative   Not on file   Social Determinants of Health   Financial Resource Strain: Low Risk    Difficulty of Paying Living Expenses: Not hard at all  Food Insecurity: No Food Insecurity  Worried About Programme researcher, broadcasting/film/video in the Last Year: Never true   [employer] of Food in the Last Year: Never true  Transportation Needs: No Transportation Needs   Lack of Transportation (Medical): No   Lack of Transportation (Non-Medical): No  Physical Activity: Inactive   Days of Exercise per Week: 0 days   Minutes of Exercise per Session: 0 min  Stress: No Stress Concern Present   Feeling of Stress : Not at all  Social Connections: Not on file    Additional Social History:   Allergies:   Allergies  Allergen Reactions   Albuterol Shortness Of Breath and Other (See Comments)    Makes pt feel jittery/ tacycardic   Crestor [Rosuvastatin] Other (See Comments)    Joint pain, muscle pain, and hair loss   Halcion [Triazolam] Other (See Comments)    Dizziness,headaches,bladder problems   Levaquin [Levofloxacin In D5w] Diarrhea and Itching    Shoulder pain   Naproxen Sodium Swelling    Patient tolerates in small doses   Tylenol [Acetaminophen] Swelling    Patient tolerates in small doses   Cefaclor Other (See Comments)    Doesn't remember---unsure if actually allergic    Sulfa Antibiotics Itching    Unsure of reaction possibly itching   Tramadol Itching and Nausea And Vomiting   Zinc Other (See Comments)    constipation  constipation    Aripiprazole Other (See Comments)     Muscle tension/cramping   Diclofenac Sodium Rash    "made very sick"   Ibuprofen Swelling    Patient tolerates in small doses    Metabolic Disorder Labs: Lab Results  Component Value Date   HGBA1C 6.7 (A) 11/04/2021   MPG 142.72 07/02/2021   No results found for: PROLACTIN Lab Results  Component Value Date   CHOL 159 03/10/2021   TRIG 189.0 (H) 03/10/2021   HDL 39.90 03/10/2021   CHOLHDL 4 03/10/2021   VLDL 37.8 03/10/2021   LDLCALC 81 03/10/2021   LDLCALC 223 (H) 11/13/2014     Current Medications: Current Outpatient Medications  Medication Sig Dispense Refill   Azelastine HCl 137 MCG/SPRAY SOLN PLACE 2 SPRAYS INTO BOTH NOSTRILS 2 (TWO) TIMES DAILY 30 mL 5   B Complex-Biotin-FA (BIG 100, BIOTIN, PO) Take by mouth.     calcipotriene-betamethasone (TACLONEX) ointment Apply topically daily. 60 g 0   cetirizine (ZYRTEC) 10 MG tablet Take 10 mg by mouth daily.     clindamycin (CLEOCIN T) 1 % external solution Apply to scalp once or twice a day prn bumps 30 mL 2   clobetasol (OLUX) 0.05 % topical foam Apply to affected areas at arms, legs, trunk twice a day up to 2 weeks. Avoid applying to face, groin, and axilla. Use as directed. Risk of skin atrophy with long-term use reviewed. 50 g 0   clobetasol (TEMOVATE) 0.05 % external solution Mix clobetasol solution with  CeraVe cream Use twice daily to affected areas.Avoid Face, groin and underarm 50 mL 0   Continuous Blood Gluc Sensor (FREESTYLE LIBRE 14 DAY SENSOR) MISC PLACE INTO SKIN FOR CONTINUOUS CHECK OF BLOOD SUGAR DAILY 6 each 2   desipramine (NORPRAMIN) 25 MG tablet Take 1 tablet (25 mg total) by mouth daily. 30 tablet 6   Dimethyl Fumarate 120 & 240 MG MISC  (Patient not taking: Reported on 11/04/2021)     diphenhydrAMINE (BENADRYL) 2 % cream Apply topically 3 (three) times daily as needed for itching.     Dulaglutide (TRULICITY) 3 MG/0.5ML  SOPN Inject 3 mg as directed once a week. For diabetes. 2 mL 0   DULoxetine (CYMBALTA)  20 MG capsule Take 2 capsules (40 mg total) by mouth daily. 180 capsule 2   Evolocumab (REPATHA SURECLICK) 140 MG/ML SOAJ Inject 1 Dose into the skin every 14 (fourteen) days. 2 mL 3   famotidine (PEPCID) 20 MG tablet Take 1 tablet (20 mg total) by mouth 2 (two) times daily. 180 tablet 3   fenofibrate (TRICOR) 145 MG tablet Take 1 tablet (145 mg total) by mouth daily. 30 tablet 2   fluocinonide (LIDEX) 0.05 % external solution Apply 1 application topically 2 (two) times daily as needed. Apply to scalp twice daily as need for itch 180 mL 3   hydrocortisone 2.5 % cream Apply 1 application topically 2 (two) times daily as needed (underarm/groin rash).     hydrOXYzine (VISTARIL) 50 MG capsule Take 2 capsules (100 mg total) by mouth at bedtime. TAKE 1 CAPSLE BY MOUTH AT NOON, 1 AT 6 PM, AND 1 AS NEEDED 90 capsule 3   lamoTRIgine (LAMICTAL) 150 MG tablet Take 2 tablets (300 mg total) by mouth at bedtime. 180 tablet 1   levalbuterol (XOPENEX HFA) 45 MCG/ACT inhaler INHALE 1 TO 2 PUFFS BY MOUTH EVERY 6 HOURS AS NEEDED FOR WHEEZE 1 each 0   Lysine 500 MG TABS Take by mouth.     meclizine (ANTIVERT) 25 MG tablet Take 0.5-1 tablets (12.5-25 mg total) by mouth 3 (three) times daily as needed for dizziness (sedation caution). 30 tablet 0   metFORMIN (GLUCOPHAGE-XR) 500 MG 24 hr tablet Take 2 tablets (1,000 mg total) by mouth daily with breakfast. 60 tablet 11   methocarbamol (ROBAXIN) 500 MG tablet TAKE 1-2 TABLETS (500-1,000 MG TOTAL) BY MOUTH AT BEDTIME AS NEEDED FOR MUSCLE SPASMS. 180 tablet 0   metoprolol tartrate (LOPRESSOR) 50 MG tablet Take 1 tablet (50 mg total) by mouth 2 (two) times daily. 180 tablet 3   neomycin-bacitracin-polymyxin (NEOSPORIN) [phone] ointment Apply 1 application topically 4 (four) times daily as needed (for cut/scrapes.).     pantoprazole (PROTONIX) 40 MG tablet Take 1 tablet (40 mg total) by mouth daily. For heartburn 90 tablet 3   polyethylene glycol powder (GLYCOLAX/MIRALAX)  powder MIX 17 GRAMS (1 CAPFUL) WITH 4-8 OZ OF LIQUID AND TAKE BY MOUTH TWICE DAILY AS NEEDED 527 g 0   polyvinyl alcohol (LIQUIFILM TEARS) 1.4 % ophthalmic solution Place 2 drops into both eyes at bedtime.     temazepam (RESTORIL) 30 MG capsule TAKE ONE TO TWO CAPSULES BY MOUTH NIGHTLY AT BEDTIME 60 capsule 4   traZODone (DESYREL) 50 MG tablet Take 2 tablets (100 mg total) by mouth at bedtime. 60 tablet 7   valACYclovir (VALTREX) 1000 MG tablet TAKE 1 TABLET (1,000 MG TOTAL) BY MOUTH DAILY. FOR HERPES OUTBREAK PREVENTION 90 tablet 0   No current facility-administered medications for this visit.    Neurologic: Headache: No Seizure: No Paresthesias:No  Musculoskeletal: Strength & Muscle Tone: within normal limits Gait & Station: normal Patient leans: N/A  Psychiatric Specialty Exam: ROS  Last menstrual period 08/23/2014.There is no height or weight on file to calculate BMI.  General Appearance: Casual  Eye Contact:  Good  Speech:  Clear and Coherent  Volume:  Normal  Mood:  Negative  Affect:  Appropriate  Thought Process:  Goal Directed  Orientation:  NA  Thought Content:  Logical  Suicidal Thoughts:  No  Homicidal Thoughts:  No  Memory:  Negative  Judgement:  Good  Insight:  Good  Psychomotor Activity:  Normal  Concentration:    Recall:    Fund of Knowledge:Good  Language: Good  Akathisia:  No  Handed:  Right  AIMS (if indicated):    Assets:  Desire for Improvement  ADL's:  Intact  Cognition: WNL  Sleep:      1/18/20233:49 PM   Today was a very short visit.  She will continue taking all medication as prescribed and return to see me in about 2 months in person.

## 2021-12-04 ENCOUNTER — Other Ambulatory Visit: Payer: Self-pay

## 2021-12-04 ENCOUNTER — Telehealth (INDEPENDENT_AMBULATORY_CARE_PROVIDER_SITE_OTHER): Payer: PPO | Admitting: Adult Health

## 2021-12-04 ENCOUNTER — Encounter: Payer: Self-pay | Admitting: Adult Health

## 2021-12-04 VITALS — Ht 69.0 in | Wt 234.7 lb

## 2021-12-04 DIAGNOSIS — G4733 Obstructive sleep apnea (adult) (pediatric): Secondary | ICD-10-CM | POA: Diagnosis not present

## 2021-12-04 DIAGNOSIS — J449 Chronic obstructive pulmonary disease, unspecified: Secondary | ICD-10-CM

## 2021-12-04 NOTE — Patient Instructions (Addendum)
Begin Stiolto 2 puffs daily, rinse after use May use Xopenex inhaler or nebulizer as needed Continue on CPAP at bedtime  Keep up the good work  Work on [employer]  Do not drive if sleeping  Follow up with Dr. Mortimer Fries in 3 months and As needed   Please contact office for sooner follow up if symptoms do not improve or worsen or seek emergency care

## 2021-12-04 NOTE — Progress Notes (Addendum)
Virtual Visit via Video Note  I connected with Emily Moon on 12/04/21 at  4:30 PM EST by a video enabled telemedicine application and verified that I am speaking with the correct person using two identifiers.  Location: Patient: Home  Provider: Office    I discussed the limitations of evaluation and management by telemedicine and the availability of in person appointments. The patient expressed understanding and agreed to proceed.  History of Present Illness: 61 year old female former smoker followed for COPD with asthma and obstructive sleep apnea Medical history significant for multiple sclerosis  Today's video visit is a 83-month follow-up.  Patient has underlying COPD with suspected asthma.  Patient last visit was recommended to restart on Symbicort.  Patient says this caused a flare of her cold sores.  Patient then was changed over to [facility] which did the exact same thing.  Patient says that she just cannot tolerate this is is so uncomfortable.  She did lots of rinsing after the inhalers but still broke out.  She says when she uses her Xopenex inhaler she does not have this problem. Patient says she does feel better when she is on the inhaler it helps her breathing.  Would like to talk about an alternative inhaler.  Patient has underlying sleep apnea.  Is on nocturnal CPAP.  Patient says she tries to wear CPAP every few nights usually gets in about 4 to 5 hours.  Does have some nasal stuffiness.  Patient CPAP download shows 100% compliance with daily average usage of 5 hours.  Patient is on auto CPAP 10 to 20 cm H2O.  AHI is 4.4/hour.  Positive mask leak.  We did talk about changing out her mask more frequently as this may be contributing to some of her nasal dryness.     Observations/Objective: PFT April 29, 2020 that showed moderate restriction and airflow obstruction with FEV1 at 63%, ratio 72, FVC 68%, positive bronchodilator response.  DLCO 116%.    06/2020 CPAP ttiration showed  CPAP 14 cm with med full fac emask (no need for oxygen or BIPAP )   12/04/2021 : Appears in no acute distress  Assessment and Plan: COPD -unable to tolerate Breo and Symbicort due to flare of cold sores.  Will change over to [employer].  Obstructive sleep apnea.  Patient is continue on CPAP at bedtime  Plan  Patient Instructions  Begin Stiolto 2 puffs daily, rinse after use May use Xopenex inhaler or nebulizer as needed Continue on CPAP at bedtime  Keep up the good work  Work on [employer]  Do not drive if sleeping  Follow up with Dr. Mortimer Fries in 3 months and As needed   Please contact office for sooner follow up if symptoms do not improve or worsen or seek emergency care   '   Follow Up Instructions: Follow up in 3 months and As needed     I discussed the assessment and treatment plan with the patient. The patient was provided an opportunity to ask questions and all were answered. The patient agreed with the plan and demonstrated an understanding of the instructions.   The patient was advised to call back or seek an in-person evaluation if the symptoms worsen or if the condition fails to improve as anticipated.  I provided 22  minutes of non-face-to-face time during this encounter.   Rexene Edison, NP

## 2021-12-05 ENCOUNTER — Other Ambulatory Visit: Payer: Self-pay

## 2021-12-05 ENCOUNTER — Ambulatory Visit (INDEPENDENT_AMBULATORY_CARE_PROVIDER_SITE_OTHER): Payer: PPO | Admitting: Pharmacist

## 2021-12-05 DIAGNOSIS — F3170 Bipolar disorder, currently in remission, most recent episode unspecified: Secondary | ICD-10-CM

## 2021-12-05 DIAGNOSIS — E119 Type 2 diabetes mellitus without complications: Secondary | ICD-10-CM

## 2021-12-05 DIAGNOSIS — G35 Multiple sclerosis: Secondary | ICD-10-CM

## 2021-12-05 DIAGNOSIS — E785 Hyperlipidemia, unspecified: Secondary | ICD-10-CM

## 2021-12-05 DIAGNOSIS — J449 Chronic obstructive pulmonary disease, unspecified: Secondary | ICD-10-CM

## 2021-12-05 MED ORDER — STIOLTO RESPIMAT 2.5-2.5 MCG/ACT IN AERS: 2.0000 | INHALATION_SPRAY | Freq: Every day | RESPIRATORY_TRACT | 5 refills | Status: DC

## 2021-12-05 NOTE — Addendum Note (Signed)
Addended by: Melvenia Needles on: 12/05/2021 05:27 PM   Modules accepted: Orders

## 2021-12-05 NOTE — Progress Notes (Signed)
Chronic Care Management Pharmacy Note  12/05/2021 Name:  Emily Moon MRN:  [id number] DOB:  [birth year]  Summary: -Pt reports Repatha copay is cost-prohibitive -Pt has not started MS medication dimethyl fumarate due to issues at the pharmacy. [facility] needs to talk to patient before they can process it. -Pt reports worsening rash/eczema since starting metformin -Pt was advised to start [place] yesterday at pulmonary appt- needs Rx still  Recommendations/Changes made from today's visit: -Enrolled patient in [place] for [place] pt to contact [facility] pharmacy for dimethyl fumarate -Advised pt to trial off metformin and monitor rash. If no change, restart metformin. If rash goes away, contact PCP or PharmD for next DM options (consider Jardiance) -Coordinate with pulmonary for [facility]to [facility]  Plan: -[place] will call patient 2 weeks re: metformin, dimethyl fumarate, and Stiolto -Pharmacist follow up televisit scheduled for 2 months   Subjective: Emily Moon is an 61 y.o. year old female who is a primary patient of [name], NP.  The CCM team was consulted for assistance with disease management and care coordination needs.    Engaged with patient by telephone for follow up visit in response to provider referral for pharmacy case management and/or care coordination services.   Consent to Services:  The patient was given information about Chronic Care Management services, agreed to services, and gave verbal consent prior to initiation of services.  Please see initial visit note for detailed documentation.   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 She only leaves home for grocery trips and doctor appts.  Recent office visits: 11/04/21 Dr Silvio Pate OV: f/u DM. Pt mixed up Trulicity and Repatha. Concerned about high BG later in day - add metformin 500 mg daily. Can increase to 2 tablets as tolerated.  09/19/21 Dr Silvio Pate OV: UTI - rx'd Macrobid  09/02/2021 - [name], NP - Video Visit - Patient presented for facial swelling. Labs: A1c. Increase Trulicity to 3 mg/week.  06/04/2021 - [name], NP - Patient presented for annual exam. Labs:A1c, Cytology - PAP. Change: cyanocobalamin (,VITAMIN B-12,) 1000 MCG/ML injection, famotidine (PEPCID) 20 MG tablet and pantoprazole (PROTONIX) 40 MG tablet.  06/03/2021 - Andrez Grime, LPN - Patient presented for Annual Wellness Visit.   05/08/2021 - Owens Loffler, MD - Patient presented for psoriatic arthritis. Referral to Rheumatology   04/23/2021 - [name], NP - Patient presented for Pre-op clearance. Start: valACYclovir (VALTREX) 1000 MG tablet.  Recent consult visits: 12/04/21 NP Tammy Parrett (pulmonary [facility]): f/u COPD. Start Stiolto 2 puffs daily. Use Xoponex PRN.  12/03/21 Dr Casimiro Needle [facility] (psychiatry): f/u depression. Short visit, no med change and RTC 2 months for in-person visit.  11/18/21 Dr Laurence Ferrari (dermatology): f/u eczema and folliculitis. Start clindamycin solution for scalp.  10/27/21 Dr Garen Lah (cardiology): f/u tachycardia, improved with lopressor. No med changes.  10/21/21 Dr Laurence Ferrari (dermatology): f/u eczema flare. Restart clobetasol foam.  10/14/21 Dr Melrose Nakayama [facility]): initial consult for MS. Start Tecfidera 120 mg BID x 2 weeks then increase to 240 mg BID. F/U 2 months.  08/22/21 PA Kathlen Mody (Cardiology): f/u tachycardia. D/c toprol and Start metoprolol tartrate 50 mg BID. Ordered 2-wk heart monitor  08/08/2021 -  Dr Garen Lah, Cardiology - Patient presented for follow up inappropriate sinus tachycardia.  Labs: Increase metoprolol succinate to 75 mg daily. Returned to 50 mg dose shortly after due to worsening sx.  07/30/2021 - [facility] - Video Visit for Mixed Bipolar, depression, insomnia. No med changes.  06/27/2021 - NP Parrett, Pulmonary - Patient presented for COPD with Asthma. Labs: DG Chest 2 views No medication changes.   06/26/2021 - Cardiology - Patient presented for follow up inappropriate sinus tachycardia. Labs: Lipid and EKG. No medication changes.   06/19/2021 - Dr Jefm Bryant, Rheumatology - referred to rule out psoriatic arthritis. No definite dx of psoriasis.   06/05/2021 -  Cardiology - Patient presented for shortness of breath. Procedures: PR ECHO [place].   05/23/2021 - [facility] - Patient presented for Unilateral primary osteoarthritis.   05/14/2021 - Ophthalmology - Patient presented for Diabetic eye exam   04/18/2021 - Cardiology - Patient presented for pre-operative cardiovascular exam. Labs: Echocardiogram and EKG.   04/02/2021 - Orthopedic Surgery - Patient presented for pain in right hip Procedures: CHG RADEX HIP UNILATERAL WITH PELVIS 2-3 VIEWS.  03/27/2021 - Dermatology - Patient presented for rash. Start: clindamycin (CLEOCIN T) 1 % external solution, clobetasol (TEMOVATE) 0.05 % external solution and clobetasol (OLUX) 0.05 % topical foam.  03/06/2021 - Podiatry - Patient presented for at risk foot care. Diabetic Foot Exam performed.   Hospital visits: Medication Reconciliation was completed by comparing discharge summary, patients EMR and Pharmacy list, and upon discussion with patient.   Admitted to the hospital on 07/02/2021 due to Total hip arthroplasty anterior approach. Discharge date was 07/03/2021. Discharged from [place]?Medications Started at [facility] Discharge:?? -started oxyCODONE (OXY IR/ROXICODONE) 5 MG immediate release tablet -started rivaroxaban (XARELTO) 10 MG TABS tablet x  20 days, then aspirin 81 mg   Medication Changes at Hospital Discharge: -Changed methocarbamol (ROBAXIN) 500 MG tablet - Take 1 tablet (500 mg total) by mouth every 6 (six) hours as needed for muscle spasms., Starting Thu 07/03/2021, Until Fri 07/25/2021   Medications Discontinued at Hospital Discharge: -Stopped Aspirin EC 81 mg -Stopped Biotin PO -Stopped Lysine 1079m Tabs -Stopped Magnesium 500 mg Tabs -Stopped Vitamin D3 2557m Capsule    Medications that remain the same after Hospital Discharge:??  -All other medications will remain the same.    Objective:  Lab Results  Component Value Date   CREATININE 0.94 07/03/2021   BUN 18 07/03/2021   GFR 75.32 08/28/2020   GFRNONAA >60 07/03/2021   GFRAA >60 02/19/2020   NA 139 07/03/2021   K 3.6 07/03/2021   CALCIUM 8.5 (L) 07/03/2021   CO2 26 07/03/2021   GLUCOSE 147 (H) 07/03/2021    Lab Results  Component Value Date/Time   HGBA1C 6.7 (A) 11/04/2021 10:20 AM   HGBA1C 6.6 (H) 07/02/2021 01:08 PM   HGBA1C 6.3 (A) 06/04/2021 02:45 PM   HGBA1C 6.9 (H) 03/10/2021 04:28 PM   GFR 75.32 08/28/2020 12:33 PM   GFR 63.54 01/22/2020 02:32 PM   MICROALBUR 1.0 03/10/2021 04:28 PM    Last diabetic Eye exam:  Lab Results  Component Value Date/Time   HMDIABEYEEXA No Retinopathy 05/14/2021 12:00 AM    Last diabetic Foot exam: No results found for: HMDIABFOOTEX   Lab Results  Component Value Date   CHOL 159 03/10/2021   HDL 39.90 03/10/2021   LDLCALC 81 03/10/2021   LDLDIRECT 53.0 08/01/2020   TRIG 189.0 (H) 03/10/2021   CHOLHDL 4  03/10/2021    Hepatic Function Latest Ref Rng & Units 06/23/2021 02/19/2020 01/22/2020  Total Protein 6.5 - 8.1 g/dL 7.7 7.6 6.9  Albumin 3.5 - 5.0 g/dL 4.8 4.5 4.4  AST 15 - 41 U/L _0 ALT 0 - 44 U/L 44 19 16  Alk Phosphatase 38 - 126 U/L 44 75 73  Total Bilirubin 0.3 - 1.2 mg/dL 0.5 0.8 0.4  Bilirubin, Direct 0.0 - 0.3 mg/dL - - -    Lab Results  Component Value Date/Time   TSH 0.902  02/19/2020 03:53 PM   TSH 1.31 05/10/2019 02:34 PM   TSH 4.32 11/24/2018 08:26 AM   FREET4 1.08 02/19/2020 03:53 PM   FREET4 0.82 07/25/2013 12:16 PM    CBC Latest Ref Rng & Units 07/03/2021 06/23/2021 02/19/2020  WBC 4.0 - 10.5 K/uL 11.8(H) 9.3 8.3  Hemoglobin 12.0 - 15.0 g/dL 10.4(L) 13.2 14.2  Hematocrit 36.0 - 46.0 % 31.8(L) 40.1 42.2  Platelets 150 - 400 K/uL 384 492(H) 352    Lab Results  Component Value Date/Time   VD25OH 75.66 01/22/2020 02:32 PM   VD25OH 61.68 08/15/2019 11:13 AM    Clinical ASCVD: No  The 10-year ASCVD risk score (Arnett DK, et al., 2019) is: 4.4%   Values used to calculate the score:     Age: 21 years     Sex: Female     Is Non-Hispanic African American: No     Diabetic: Yes     Tobacco smoker: No     Systolic Blood Pressure: 909 mmHg     Is BP treated: No     HDL Cholesterol: 39.9 mg/dL     Total Cholesterol: 159 mg/dL    Depression screen [facility] 2/9 06/03/2021 04/23/2021 04/05/2020  Decreased Interest _1 Down, Depressed, Hopeless _2 PHQ - 2 Score _3 Altered sleeping 3 3 0  Tired, decreased energy 3 3 0  Change in appetite 0 0 0  Feeling bad or failure about yourself  0 3 0  Trouble concentrating 1 0 0  Moving slowly or fidgety/restless 1 0 0  Suicidal thoughts 1 0 0  PHQ-9 Score _4 Difficult doing work/chores Somewhat difficult Very difficult Somewhat difficult  Some recent data might be hidden    No flowsheet data found.   Social History   Tobacco Use  Smoking Status Former   Packs/day: 1.00   Years: 25.00   Pack years: 25.00   Types: Cigarettes   Quit date: 2019   Years since quitting: 4.0  Smokeless Tobacco Never  Tobacco Comments   occasional use   BP Readings from Last 3 Encounters:  11/04/21 110/78  10/27/21 110/64  09/19/21 118/82   Pulse Readings from Last 3 Encounters:  11/04/21 92  10/27/21 90  09/19/21 97   Wt Readings from Last 3 Encounters:  12/04/21 234 lb 11.2 oz (106.5 kg)  09/08/21 239 lb  4 oz (108.5 kg)  09/02/21 229 lb (103.9 kg)   BMI Readings from Last 3 Encounters:  12/04/21 34.66 kg/m  11/04/21 35.33 kg/m  10/27/21 35.33 kg/m    Assessment/Interventions: Review of patient past medical history, allergies, medications, health status, including review of consultants reports, laboratory and other test data, was performed as part of comprehensive evaluation and provision of chronic care management services.   SDOH:  (Social Determinants of Health) assessments and interventions performed: Yes  SDOH Screenings   Alcohol Screen: Low Risk  Last Alcohol Screening Score (AUDIT): 0  Depression (PHQ2-9): Medium Risk   PHQ-2 Score: 15  Financial Resource Strain: Low Risk    Difficulty of Paying Living Expenses: Not hard at all  Food Insecurity: No Food Insecurity   Worried About Charity fundraiser in the Last Year: Never true   Ran Out of Food in the Last Year: Never true  Housing: Low Risk    Last Housing Risk Score: 0  Physical Activity: Inactive   Days of Exercise per Week: 0 days   Minutes of Exercise per Session: 0 min  Social Connections: Not on file  Stress: No Stress Concern Present   Feeling of Stress : Not at all  Tobacco Use: Medium Risk   Smoking Tobacco Use: Former   Smokeless Tobacco Use: Never   Passive Exposure: Not on file  Transportation Needs: No Transportation Needs   Lack of Transportation (Medical): No   Lack of Transportation (Non-Medical): No    CCM Care Plan  Allergies  Allergen Reactions   Albuterol Shortness Of Breath and Other (See Comments)    Makes pt feel jittery/ tacycardic   Crestor [Rosuvastatin] Other (See Comments)    Joint pain, muscle pain, and hair loss   Halcion [Triazolam] Other (See Comments)    Dizziness,headaches,bladder problems   Levaquin [Levofloxacin In D5w] Diarrhea and Itching    Shoulder pain   Naproxen Sodium Swelling    Patient tolerates in small doses   Tylenol [Acetaminophen] Swelling     Patient tolerates in small doses   Cefaclor Other (See Comments)    Doesn't remember---unsure if actually allergic    Sulfa Antibiotics Itching    Unsure of reaction possibly itching   Tramadol Itching and Nausea And Vomiting   Zinc Other (See Comments)    constipation  constipation    Aripiprazole Other (See Comments)    Muscle tension/cramping   Diclofenac Sodium Rash    "made very sick"   Ibuprofen Swelling    Patient tolerates in small doses    Medications Reviewed Today     Reviewed by [name], [facility] (Pharmacist) on 12/05/21 at [place] List Status: <None>   Medication Order Taking? Sig Documenting Provider Last Dose Status Informant  Azelastine HCl 137 MCG/SPRAY SOLN [id number] Yes PLACE 2 SPRAYS INTO BOTH NOSTRILS 2 (TWO) TIMES DAILY [name], NP Taking Active   B Complex-Biotin-FA (BIG 100, BIOTIN, PO) [id number] Yes Take by mouth. [provider] Taking Active   calcipotriene-betamethasone (TACLONEX) ointment [id number] Yes Apply topically daily. [name], NP Taking Active   cetirizine (ZYRTEC) 10 MG tablet [id number] Yes Take 10 mg by mouth daily. [provider] Taking Active Self  clindamycin (CLEOCIN T) 1 % external solution [id number] Yes Apply to scalp once or twice a day prn bumps Moye, [place], MD Taking Active   clobetasol (OLUX) 0.05 % topical foam [id number] Yes Apply to affected areas at arms, legs, trunk twice a day up to 2 weeks. Avoid applying to face, groin, and axilla. Use as directed. Risk of skin atrophy with long-term use reviewed. Moye, [place], MD Taking Active   clobetasol (TEMOVATE) 0.05 % external solution [id number] Yes Mix clobetasol solution with  CeraVe cream Use twice daily to affected areas.Avoid Face, groin and underarm Moye, [place], MD Taking Active Self  Continuous Blood Gluc Sensor (FREESTYLE LIBRE [place]) [place] [id number] Yes PLACE INTO SKIN FOR CONTINUOUS CHECK OF BLOOD SUGAR DAILY [name], NP Taking Active  Self  desipramine (NORPRAMIN) 25 MG tablet [id number] Yes Take 1 tablet (25 mg total) by mouth daily. Norma Fredrickson, MD Taking Active   Dimethyl Fumarate 120 & 240 MG MISC [id number] No   Patient not taking: Reported on 12/05/2021   [provider] Not Taking Active   diphenhydrAMINE (BENADRYL) 2 % cream [id number] Yes Apply topically 3 (three) times daily as needed for itching. [provider] Taking Active   Dulaglutide (TRULICITY) 3 BP/1.0CH SOPN [id number] Yes Inject 3 mg as directed once a week. For diabetes. [name], NP Taking Active            Med Note [name]   Fri Dec 05, 2021  2:07 PM) Via [facility] pt assistance  DULoxetine (CYMBALTA) 20 MG capsule [id number] Yes Take 2 capsules (40 mg total) by mouth daily. Norma Fredrickson, MD Taking Active   Evolocumab [facility] SURECLICK) 540 MG/ML Darden Palmer [id number] Yes Inject 1 Dose into the skin every 14 (fourteen) days. [name] Sable, MD Taking Active Self  famotidine (PEPCID) 20 MG tablet [id number] Yes Take 1 tablet (20 mg total) by mouth 2 (two) times daily. [name], NP Taking Active Self  fenofibrate (TRICOR) 145 MG tablet [id number] Yes Take 1 tablet (145 mg total) by mouth daily. [name] Sable, MD Taking Active   fluocinonide (LIDEX) 0.05 % external solution [id number] Yes Apply 1 application topically 2 (two) times daily as needed. Apply to scalp twice daily as need for itch Moye, [place], MD Taking Active Self  hydrocortisone 2.5 % cream [id number] Yes Apply 1 application topically 2 (two) times daily as needed (underarm/groin rash). [provider] Taking Active Self  hydrOXYzine (VISTARIL) 50 MG capsule [id number] Yes Take 2 capsules (100 mg total) by mouth at bedtime. TAKE 1 CAPSLE BY MOUTH AT NOON, 1 AT 6 PM, AND 1 AS NEEDED Plovsky, Gerald, MD Taking Active   lamoTRIgine (LAMICTAL) 150 MG tablet [id number] Yes Take 2 tablets (300 mg total) by  mouth at bedtime. Norma Fredrickson, MD Taking Active   levalbuterol [facility] HFA) 45 MCG/ACT inhaler [id number] Yes INHALE 1 TO 2 PUFFS BY MOUTH EVERY 6 HOURS AS NEEDED FOR WHEEZE [name], NP Taking Active Self  Lysine 500 MG TABS [id number] Yes Take by mouth. [provider] Taking Active   meclizine (ANTIVERT) 25 MG tablet [id number] Yes Take 0.5-1 tablets (12.5-25 mg total) by mouth 3 (three) times daily as needed for dizziness (sedation caution). Tonia Ghent, MD Taking Active Self  metFORMIN (GLUCOPHAGE-XR) 500 MG 24 hr tablet [id number] Yes Take 2 tablets (1,000 mg total) by mouth daily with breakfast. Venia Carbon, MD Taking Active   methocarbamol (ROBAXIN) 500 MG tablet [id number] Yes TAKE 1-2 TABLETS (500-1,000 MG TOTAL) BY MOUTH AT BEDTIME AS NEEDED FOR MUSCLE SPASMS. [name], NP Taking Active   metoprolol tartrate (LOPRESSOR) 50 MG tablet [id number]  Take 1 tablet (50 mg total) by mouth 2 (two) times daily. Furth, Cadence H, PA-C  Expired 11/20/21 2359   neomycin-bacitracin-polymyxin (NEOSPORIN) [phone] ointment [id number] Yes Apply 1 application topically 4 (four) times daily as needed (for cut/scrapes.). [provider] Taking Active Self           Med Note Earna Coder Mar 10, 2021  3:56 PM)    pantoprazole (PROTONIX) 40 MG tablet [id number] Yes Take 1 tablet (40 mg total) by mouth daily. For heartburn [name], NP Taking Active Self  polyethylene glycol powder (GLYCOLAX/MIRALAX) powder  [id number] Yes MIX 17 GRAMS (1 CAPFUL) WITH 4-8 OZ OF LIQUID AND TAKE BY MOUTH TWICE DAILY AS NEEDED [name], NP Taking Active   polyvinyl alcohol (LIQUIFILM TEARS) 1.4 % ophthalmic solution [id number] Yes Place 2 drops into both eyes at bedtime. [provider] Taking Active Self  temazepam (RESTORIL) 30 MG capsule [id number] Yes TAKE ONE TO TWO CAPSULES BY MOUTH NIGHTLY AT BEDTIME Plovsky, Berneta Sages, MD Taking Active    traZODone (DESYREL) 50 MG tablet [id number] Yes Take 2 tablets (100 mg total) by mouth at bedtime. Norma Fredrickson, MD Taking Active   valACYclovir (VALTREX) 1000 MG tablet [id number] Yes TAKE 1 TABLET (1,000 MG TOTAL) BY MOUTH DAILY. FOR HERPES OUTBREAK PREVENTION [name], NP Taking Active             Patient Active Problem List   Diagnosis Date Noted   Myalgia due to statin 11/04/2021   Acute cystitis without hematuria 09/19/2021   Swelling of lymph node 09/02/2021   Primary osteoarthritis of right hip 07/02/2021   Preop pulmonary/respiratory exam 06/27/2021   Imbalance 12/10/2020   Type 2 diabetes mellitus without complication, without long-term current use of insulin ([place]) 08/28/2020   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   Psoriasis 11/02/2019   Rash and nonspecific skin eruption 02/20/2019   COPD with asthma ([place]) 02/10/2019   Epigastric pain 12/21/2018   OSA (obstructive sleep apnea) 12/02/2018   Joint swelling 10/24/2018   Environmental and seasonal allergies 10/24/2018   Greater trochanteric bursitis of right hip 09/28/2018   Bipolar I disorder, most recent episode (or current) manic ([place]) 05/20/2018   Vitamin D deficiency 02/15/2018   Preventative health care 11/24/2017   B12 deficiency 05/22/2016   Vertigo 03/16/2016   Medicare annual wellness visit, subsequent 10/25/2015   Chronic back pain 10/31/2014   Bipolar disorder ([place]) 05/21/2014   Pre-operative clearance 01/22/2014   Deformity of right foot 12/27/2013   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 09/15/2013   Insomnia 09/01/2011   Obesity 05/04/2011   HLD (hyperlipidemia) 01/20/2011   Chest pain 01/20/2011   Chronic pain syndrome 01/07/2011   RENAL CALCULUS, RECURRENT 11/13/2010   MULTIPLE SCLEROSIS, PROGRESSIVE/RELAPSING 08/28/2010   GERD 08/28/2010   OA (osteoarthritis) of hip 08/28/2010   NEPHROLITHIASIS, HX OF 08/28/2010    Immunization History  Administered  Date(s) Administered   Influenza Split 08/05/2011   Influenza, Seasonal, Injecte, Preservative Fre 06/26/2018   Influenza,inj,Quad PF,6+ Mos 08/17/2013, 07/13/2015, 07/06/2017, 06/26/2018, 06/26/2019, 07/04/2020, 09/03/2021   Influenza-Unspecified 07/08/2016, 06/18/2017, 06/26/2018   PFIZER(Purple Top)SARS-COV-2 Vaccination 02/06/2020, 02/28/2020, 07/01/2020, 03/19/2021   Pfizer Covid-19 Vaccine Bivalent Booster 28yr & up 09/03/2021   Pneumococcal Conjugate-13 07/05/2014   Pneumococcal Polysaccharide-23 07/13/2015    Conditions to be addressed/monitored:  Hyperlipidemia, Diabetes, COPD, and Asthma, MS, Tachycardia, Bipolar Disorder   Care Plan : CCamino Updates made by FCharlton Haws RReadingsince 12/05/2021 12:00 AM     Problem: Hyperlipidemia, Diabetes, COPD, and Asthma, MS, Tachycardia, Bipolar Disorder   Priority: High     Long-Range Goal: Disease management   Start Date: 09/15/2021  Expected End Date: 09/15/2022  This Visit's Progress: On track  Recent Progress: On track  Priority: High  Note:   Current Barriers:  Unable to independently monitor therapeutic efficacy Suboptimal therapeutic regimen for COPD/asthma  Pharmacist Clinical Goal(s):  Patient will achieve adherence to monitoring guidelines and medication adherence to achieve therapeutic efficacy adhere to plan to optimize therapeutic regimen for COPD/asthma as evidenced  by report of adherence to recommended medication management changes through collaboration with PharmD and provider.   Interventions: 1:1 collaboration with [name], NP regarding development and update of comprehensive plan of care as evidenced by provider attestation and co-signature Inter-disciplinary care team collaboration (see longitudinal plan of care) Comprehensive medication review performed; medication list updated in electronic medical record  Diabetes (A1c goal <7%) -Controlled - A1c is at goal; pt  recently started metformin and reports a skin rash after starting (pt has chronic issues with eczema) -Current medications: Trulicity 3 mg weekly (Thurs) - Appropriate, Effective, Safe, Accessible Metformin 500 mg daily - Appropriate, Effective, Query Safe, Accessible Freestyle Libre -Medications previously tried: metformin  -Current meal patterns: she uses [employer] meal service for DM-friendly meals -Current exercise: limited due to deconditioning -Educated on A1c and blood sugar goals; Carbohydrate counting and/or plate method -Discussed metformin is not typically known for causing skin rashes, but can trial off med to assess for improvement -Recommended trial off metformin to see if skin rash resolves; can consider Jardiance in future if additional DM control still desired  Tachycardia (Goal: HR < 100) -Controlled - pt reports improvement in side effect since switching from succinate to tartrate; -Current treatment  Metoprolol tartrate 50 mg BID - Appropriate, Effective, Safe, Accessible -Recommended to continue current medication  Hyperlipidemia: (LDL goal < 100) -Controlled - LDL is at goal; pt endorses compliance with medications; she is intolerant to statins -Current treatment: Repatha 140 mg q14 days - Appropriate, Effective, Safe, Query Accessible Fenofibrate 145 mg daily - Appropriate, Effective, Safe, Accessible Aspirin 81 mg daily - Appropriate, Effective, Safe, Accessible -Educated on Cholesterol goals;  -Assessed pt finances - she reports Repatha is expensive; she should qualify for [employer]; enrolled her in grant program online and she was conditionally approved -Recommended to continue current medication  COPD / asthma (Goal: control symptoms and prevent exacerbations) -Not ideally controlled - pt was prescribed new Stiolto yesterday but has not started this; per chart review the Rx has not been ordered yet -Pulmonary function testing: FEV1 at 63%, ratio 72, FVC  68% (04/2020) -Current treatment  Stiolto Respimat 2 puffs daily (not started yet) - Appropriate, Effective, Safe, Query Accessible Levalbuterol HFA prn - once a day typically - Appropriate, Effective, Safe, Accessible Levalbuterol nebulizer -Appropriate, Effective, Safe, Accessible Cetirizine 10 mg HS - Appropriate, Effective, Safe, Accessible Azelastine 137 mcg nasal spray PRN -Appropriate, Effective, Safe, Accessible -Medications previously tried: Firefighter, Advair   -Patient denies consistent use of maintenance inhaler -Frequency of rescue inhaler use: daily -Counseled on Benefits of consistent maintenance inhaler use -Assessed pt finances - she would likely qualify for Stiolto pt assistance if needed; pt will call if she requires assistance -Recommend to continue current medication  Bipolar Depression / Anxiety / Insomnia (Goal: manage symptoms) -Controlled - pt reports current regimen is working well enough for her -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 -managed per Dr Casimiro Needle -PHQ9: 15 (05/2021) - moderate/severe depression -Current treatment: Desipramine 25 mg daily -Appropriate, Effective, Safe, Accessible Duloxetine 20 mg - 2 cap daily -Appropriate, Effective, Safe, Accessible Lamotrigine 150 mg - 2 tab HS -Appropriate, Effective, Safe, Accessible Temazepam 30 mg 1-2 cap HS -Appropriate, Effective, Safe, Accessible Trazodone 50 mg - 2 tab HS -Appropriate, Effective, Safe, Accessible Hydroxyzine 50 mg - 2 tab HS -Appropriate, Effective, Safe, Accessible -Medications previously tried/failed: Rexulti -Educated on Benefits of medication for symptom control; discussed consequences of abruptly stopping medications, advised that  she speak with psychiatrist if she is thinking about stopping any medications -Recommended to continue current medication  Multiple Sclerosis (Goal: manage symptoms) -Not ideally controlled  - pt has not started  dimethyl fumarate yet, it went to specialty pharmacy but they were never able to speak with patient so it has not been processed; advised [facility] and speak [facility] them -Current treatment  Dimethyl fumarate 120 mg BID - Appropriate, Effective, Safe, Query Accessible Methocarbamol 500 mg - Appropriate, Effective, Safe, Accessible -Recommended to continue current medication  Eczema (Goal: manage symptoms) -Not ideally controlled - pt reports flare of eczema since starting metformin (see above) -Current treatment  Clindamycin solution - not taking Clobetasol foam, solution - uses sometimes Fluocinonide solution Hydrocortisone cream - uses sometimes Neosporin Benadryl cream -Counseled on use/benefits of topical products; removed ciclopirox, clindamycin from medication list;  -Counseled on risks of high-potency topical steroids (clobetasol) and advised to avoid frequent/daily use  Health Maintenance -Vaccine gaps: Shingrix -Current therapy:  Miralax PRN Meclizine 25 mg Vitamin B12 injection q30 days - not taking Vitamin D3 Lysine  Biotin 100 mg - 2 HS Valacyclovir 1000 mg daily during flares -Patient is satisfied with current therapy and denies issues -Recommended to continue current medication  Patient Goals/Self-Care Activities Patient will:  - take medications as prescribed as evidenced by patient report and record review focus on medication adherence by routine collaborate with provider on medication access solutions (Trulicity, Repatha, Stiolto) -Trial off metformin, assess for improvement in rash     Medication Assistance:  Trulicity - [employer] PAP approved through 11/15/22 [place] 11/05/21 - 11/04/22  ID: [id number]  GRP: [id number]  BIN: [id number] PCN: [id number]  Compliance/Adherence/Medication fill history: Care Gaps: None  Star-Rating Drugs: Trulicity - [employer] cares  [facility] [place] [place] [address] [place] [address] Phone: [phone] Fax: [phone]   Uses pill box? No - prefers bottles Pt endorses 100% compliance  We discussed: Current pharmacy is preferred with insurance plan and patient is satisfied with pharmacy services Patient decided to: Continue current medication management strategy  Care Plan and Follow Up Patient Decision:  Patient agrees to Care Plan and Follow-up.  Plan: Telephone follow up appointment with care management team member scheduled for:  2 months  Charlene Brooke, PharmD, [facility] Clinical Pharmacist [facility] [phone]

## 2021-12-05 NOTE — Patient Instructions (Signed)
Visit Information  Phone number for Pharmacist: [phone]   Goals Addressed             This Visit's Progress    Manage My Medicine       Timeframe:  Long-Range Goal Priority:  High Start Date:     09/10/21                    Expected End Date:     09/10/22                  Follow Up Date March 2023   - call for medicine refill 2 or 3 days before it runs out - call if I am sick and can't take my medicine - keep a list of all the medicines I take; vitamins and herbals too - use a pillbox to sort medicine  -collaborate with provider on medication access solutions (Trulicity, Repatha, Stiolto) -Trial off metformin, assess for improvement in rash   Why is this important?   These steps will help you keep on track with your medicines.   Notes:         Care Plan : CCM Pharmacy Care Plan  Updates made by [name], RPH since 12/05/2021 12:00 AM     Problem: Hyperlipidemia, Diabetes, COPD, and Asthma, MS, Tachycardia, Bipolar Disorder   Priority: High     Long-Range Goal: Disease management   Start Date: 09/15/2021  Expected End Date: 09/15/2022  This Visit's Progress: On track  Recent Progress: On track  Priority: High  Note:   Current Barriers:  Unable to independently monitor therapeutic efficacy Suboptimal therapeutic regimen for COPD/asthma  Pharmacist Clinical Goal(s):  Patient will achieve adherence to monitoring guidelines and medication adherence to achieve therapeutic efficacy adhere to plan to optimize therapeutic regimen for COPD/asthma as evidenced by report of adherence to recommended medication management changes through collaboration with PharmD and provider.   Interventions: 1:1 collaboration with [name], NP regarding development and update of comprehensive plan of care as evidenced by provider attestation and co-signature Inter-disciplinary care team collaboration (see longitudinal plan of care) Comprehensive medication  review performed; medication list updated in electronic medical record  Diabetes (A1c goal <7%) -Controlled - A1c is at goal; pt recently started metformin and reports a skin rash after starting (pt has chronic issues with eczema) -Current medications: Trulicity 3 mg weekly (Thurs) - Appropriate, Effective, Safe, Accessible Metformin 500 mg daily - Appropriate, Effective, Query Safe, Accessible Freestyle Libre -Medications previously tried: metformin  -Current meal patterns: she uses [employer] meal service for DM-friendly meals -Current exercise: limited due to deconditioning -Educated on A1c and blood sugar goals; Carbohydrate counting and/or plate method -Discussed metformin is not typically known for causing skin rashes, but can trial off med to assess for improvement -Recommended trial off metformin to see if skin rash resolves; can consider Jardiance in future if additional DM control still desired  Tachycardia (Goal: HR < 100) -Controlled - pt reports improvement in side effect since switching from succinate to tartrate; -Current treatment  Metoprolol tartrate 50 mg BID - Appropriate, Effective, Safe, Accessible -Recommended to continue current medication  Hyperlipidemia: (LDL goal < 100) -Controlled - LDL is at goal; pt endorses compliance with medications; she is intolerant to statins -Current treatment: Repatha 140 mg q14 days - Appropriate, Effective, Safe, Query Accessible Fenofibrate 145 mg daily - Appropriate, Effective, Safe, Accessible Aspirin 81 mg daily - Appropriate, Effective, Safe, Accessible -Educated on Cholesterol goals;  -Assessed  pt finances - she reports Repatha is expensive; she should qualify for [employer]; enrolled her in grant program online and she was conditionally approved -Recommended to continue current medication  COPD / asthma (Goal: control symptoms and prevent exacerbations) -Not ideally controlled - pt was prescribed new Stiolto yesterday  but has not started this; per chart review the Rx has not been ordered yet -Pulmonary function testing: FEV1 at 63%, ratio 72, FVC 68% (04/2020) -Current treatment  Stiolto Respimat 2 puffs daily (not started yet) - Appropriate, Effective, Safe, Query Accessible Levalbuterol HFA prn - once a day typically - Appropriate, Effective, Safe, Accessible Levalbuterol nebulizer -Appropriate, Effective, Safe, Accessible Cetirizine 10 mg HS - Appropriate, Effective, Safe, Accessible Azelastine 137 mcg nasal spray PRN -Appropriate, Effective, Safe, Accessible -Medications previously tried: Firefighter, Advair   -Patient denies consistent use of maintenance inhaler -Frequency of rescue inhaler use: daily -Counseled on Benefits of consistent maintenance inhaler use -Assessed pt finances - she would likely qualify for Stiolto pt assistance if needed; pt will call if she requires assistance -Recommend to continue current medication  Bipolar Depression / Anxiety / Insomnia (Goal: manage symptoms) -Controlled - pt reports current regimen is working well enough for her -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 -managed per Dr Casimiro Needle -PHQ9: 15 (05/2021) - moderate/severe depression -Current treatment: Desipramine 25 mg daily -Appropriate, Effective, Safe, Accessible Duloxetine 20 mg - 2 cap daily -Appropriate, Effective, Safe, Accessible Lamotrigine 150 mg - 2 tab HS -Appropriate, Effective, Safe, Accessible Temazepam 30 mg 1-2 cap HS -Appropriate, Effective, Safe, Accessible Trazodone 50 mg - 2 tab HS -Appropriate, Effective, Safe, Accessible Hydroxyzine 50 mg - 2 tab HS -Appropriate, Effective, Safe, Accessible -Medications previously tried/failed: Rexulti -Educated on Benefits of medication for symptom control; discussed consequences of abruptly stopping medications, advised that she speak with psychiatrist if she is thinking about stopping any  medications -Recommended to continue current medication  Multiple Sclerosis (Goal: manage symptoms) -Not ideally controlled  - pt has not started dimethyl fumarate yet, it went to specialty pharmacy but they were never able to speak with patient so it has not been processed; advised [facility] -Current treatment  Dimethyl fumarate 120 mg BID - Appropriate, Effective, Safe, Query Accessible Methocarbamol 500 mg - Appropriate, Effective, Safe, Accessible -Recommended to continue current medication  Eczema (Goal: manage symptoms) -Not ideally controlled - pt reports flare of eczema since starting metformin (see above) -Current treatment  Clindamycin solution - not taking Clobetasol foam, solution - uses sometimes Fluocinonide solution Hydrocortisone cream - uses sometimes Neosporin Benadryl cream -Counseled on use/benefits of topical products; removed ciclopirox, clindamycin from medication list;  -Counseled on risks of high-potency topical steroids (clobetasol) and advised to avoid frequent/daily use  Health Maintenance -Vaccine gaps: Shingrix -Current therapy:  Miralax PRN Meclizine 25 mg Vitamin B12 injection q30 days - not taking Vitamin D3 Lysine  Biotin 100 mg - 2 HS Valacyclovir 1000 mg daily during flares -Patient is satisfied with current therapy and denies issues -Recommended to continue current medication  Patient Goals/Self-Care Activities Patient will:  - take medications as prescribed as evidenced by patient report and record review focus on medication adherence by routine collaborate with provider on medication access solutions (Trulicity, Repatha, Stiolto) -Trial off metformin, assess for improvement in rash      Patient verbalizes understanding of instructions and care plan provided today and agrees to view in [place]. Active MyChart status confirmed with  patient.   Telephone follow up appointment with pharmacy team  member scheduled for: 2 months  Charlene Brooke, PharmD, [facility] Clinical Pharmacist [facility] [phone]

## 2021-12-06 ENCOUNTER — Encounter: Payer: Self-pay | Admitting: Internal Medicine

## 2021-12-09 ENCOUNTER — Other Ambulatory Visit: Payer: Self-pay | Admitting: *Deleted

## 2021-12-09 MED ORDER — STIOLTO RESPIMAT 2.5-2.5 MCG/ACT IN AERS: 2.0000 | INHALATION_SPRAY | Freq: Every day | RESPIRATORY_TRACT | 5 refills | Status: DC

## 2021-12-10 ENCOUNTER — Encounter: Payer: PPO | Admitting: Dermatology

## 2021-12-12 ENCOUNTER — Other Ambulatory Visit: Payer: Self-pay | Admitting: Primary Care

## 2021-12-12 DIAGNOSIS — M2550 Pain in unspecified joint: Secondary | ICD-10-CM

## 2021-12-12 DIAGNOSIS — M545 Low back pain, unspecified: Secondary | ICD-10-CM

## 2021-12-15 NOTE — Telephone Encounter (Signed)
Message received this afternoon from patient.  From the internet/WebMD. Therefore, STIOLTO should be used with extreme caution in patients being treated with these drugs. Use beta-blockers with caution as they not only block the therapeutic effects of beta-agonists, but may produce severe bronchospasm in patients with COPD. I havent picked up the stiolto because I cant use it. So, back to the drawing board. Sigh Thanks, C :-)   Message routed to [facility], NP to advise on inhaler  LOV 12/04/21- Tammy,NP  Instructions  Begin Stiolto 2 puffs daily, rinse after use May use Xopenex inhaler or nebulizer as needed Continue on CPAP at bedtime  Keep up the good work  Work on [employer]  Do not drive if sleeping  Follow up with Dr. Mortimer Fries in 3 months and As needed   Please contact office for sooner follow up if symptoms do not improve or worsen or seek emergency care

## 2021-12-15 NOTE — Telephone Encounter (Signed)
We typically do not see this in patients and have several patients on beta blockers and COPD medications such as Stiolto . Also she takes Xopenex which is a bronchodilators and has done fine with this and her beta blocker.   If she does not feel comfortable , can discuss with her pharmacist or with Dr. Mortimer Fries on return ov.   All the inhalers have a bronchodilator effect.   Please contact office for sooner follow up if symptoms do not improve or worsen or seek emergency care

## 2021-12-16 DIAGNOSIS — E119 Type 2 diabetes mellitus without complications: Secondary | ICD-10-CM | POA: Diagnosis not present

## 2021-12-16 DIAGNOSIS — E785 Hyperlipidemia, unspecified: Secondary | ICD-10-CM | POA: Diagnosis not present

## 2021-12-16 DIAGNOSIS — J449 Chronic obstructive pulmonary disease, unspecified: Secondary | ICD-10-CM

## 2021-12-19 ENCOUNTER — Telehealth: Payer: Self-pay

## 2021-12-19 NOTE — Progress Notes (Signed)
Chronic Care Management Pharmacy Assistant   Name: Emily Moon  MRN: [id number] DOB: [birth year]  Reason for Encounter: CCM (General Adherence)   Recent office visits:  12/06/2021 - Emily Simpler, MD - Patient Message - Patient would like to go off of Trulicity as she has joined [employer]. Dr. Silvio Pate asks patient to remain on Trulicity.   Recent consult visits:  None since last CCM contact   Hospital visits:  None since last CCM contqact  Medications: Outpatient Encounter Medications as of 12/19/2021  Medication Sig Note   Azelastine HCl 137 MCG/SPRAY SOLN PLACE 2 SPRAYS INTO BOTH NOSTRILS 2 (TWO) TIMES DAILY    B Complex-Biotin-FA (BIG 100, BIOTIN, PO) Take by mouth.    cetirizine (ZYRTEC) 10 MG tablet Take 10 mg by mouth daily.    clindamycin (CLEOCIN T) 1 % external solution Apply to scalp once or twice a day prn bumps    clobetasol (OLUX) 0.05 % topical foam Apply to affected areas at arms, legs, trunk twice a day up to 2 weeks. Avoid applying to face, groin, and axilla. Use as directed. Risk of skin atrophy with long-term use reviewed.    clobetasol (TEMOVATE) 0.05 % external solution Mix clobetasol solution with  CeraVe cream Use twice daily to affected areas.Avoid Face, groin and underarm    Continuous Blood Gluc Sensor (FREESTYLE LIBRE 14 DAY SENSOR) MISC PLACE INTO SKIN FOR CONTINUOUS CHECK OF BLOOD SUGAR DAILY    desipramine (NORPRAMIN) 25 MG tablet Take 1 tablet (25 mg total) by mouth daily.    Dimethyl Fumarate 120 & 240 MG MISC  (Patient not taking: Reported on 12/05/2021)    diphenhydrAMINE (BENADRYL) 2 % cream Apply topically 3 (three) times daily as needed for itching.    Dulaglutide (TRULICITY) 3 NW/2.9FA SOPN Inject 3 mg as directed once a week. For diabetes. 12/05/2021: Via [place] pt assistance   DULoxetine (CYMBALTA) 20 MG capsule Take 2 capsules (40 mg total) by mouth daily.    Evolocumab (REPATHA SURECLICK) 213 MG/ML SOAJ Inject 1 Dose into the  skin every 14 (fourteen) days.    famotidine (PEPCID) 20 MG tablet Take 1 tablet (20 mg total) by mouth 2 (two) times daily.    fenofibrate (TRICOR) 145 MG tablet Take 1 tablet (145 mg total) by mouth daily.    fluocinonide (LIDEX) 0.05 % external solution Apply 1 application topically 2 (two) times daily as needed. Apply to scalp twice daily as need for itch    hydrocortisone 2.5 % cream Apply 1 application topically 2 (two) times daily as needed (underarm/groin rash).    hydrOXYzine (VISTARIL) 50 MG capsule Take 2 capsules (100 mg total) by mouth at bedtime. TAKE 1 CAPSLE BY MOUTH AT NOON, 1 AT 6 PM, AND 1 AS NEEDED    lamoTRIgine (LAMICTAL) 150 MG tablet Take 2 tablets (300 mg total) by mouth at bedtime.    levalbuterol (XOPENEX HFA) 45 MCG/ACT inhaler INHALE 1 TO 2 PUFFS BY MOUTH EVERY 6 HOURS AS NEEDED FOR WHEEZE    Lysine 500 MG TABS Take by mouth.    meclizine (ANTIVERT) 25 MG tablet Take 0.5-1 tablets (12.5-25 mg total) by mouth 3 (three) times daily as needed for dizziness (sedation caution).    metFORMIN (GLUCOPHAGE-XR) 500 MG 24 hr tablet Take 2 tablets (1,000 mg total) by mouth daily with breakfast.    methocarbamol (ROBAXIN) 500 MG tablet TAKE 1-2 TABLETS (500-1,000 MG TOTAL) BY MOUTH AT BEDTIME AS NEEDED FOR MUSCLE SPASMS.  metoprolol tartrate (LOPRESSOR) 50 MG tablet Take 1 tablet (50 mg total) by mouth 2 (two) times daily.    neomycin-bacitracin-polymyxin (NEOSPORIN) [phone] ointment Apply 1 application topically 4 (four) times daily as needed (for cut/scrapes.).    pantoprazole (PROTONIX) 40 MG tablet Take 1 tablet (40 mg total) by mouth daily. For heartburn    polyethylene glycol powder (GLYCOLAX/MIRALAX) powder MIX 17 GRAMS (1 CAPFUL) WITH 4-8 OZ OF LIQUID AND TAKE BY MOUTH TWICE DAILY AS NEEDED    polyvinyl alcohol (LIQUIFILM TEARS) 1.4 % ophthalmic solution Place 2 drops into both eyes at bedtime.    temazepam (RESTORIL) 30 MG capsule TAKE ONE TO TWO CAPSULES BY MOUTH  NIGHTLY AT BEDTIME    Tiotropium Bromide-Olodaterol (STIOLTO RESPIMAT) 2.5-2.5 MCG/ACT AERS Inhale 2 puffs into the lungs daily.    traZODone (DESYREL) 50 MG tablet Take 2 tablets (100 mg total) by mouth at bedtime.    valACYclovir (VALTREX) 1000 MG tablet TAKE 1 TABLET (1,000 MG TOTAL) BY MOUTH DAILY. FOR HERPES OUTBREAK PREVENTION    No facility-administered encounter medications on file as of 12/19/2021.   Attempted contact with patient 3 times. Unsuccessful outreach. Patient has a rescheduled appointment with Emily Moon on 01/05/2022 at 2:15 pm.   Patient is more than 5 days past due for refill on the following medications per chart history: Trulicity 3 mg  Star Medications: Medication Name/mg Last Fill Days Supply Metformin 500 mg  81/08/3158 90 Trulicity 3 mg   45/85/9292 28    [facility]  Since last visit with CPP, the following interventions have been made.  Patient is to remain on Trulicity.  Patient to start Dimethyl Fumarate Patient to start [place] Patient is to trial off Metformin and monitor rash. If no change, restart Metformin. If rash goes away, contact PCP for alternative (Jardiance).   [facility] Stiolto, but patient has never picked it up.   [facility] in reference to [place] Fumarate. Pharmacy needs a prior authorization completed. Pharmacy stated they have faxed several times in regards to the PA. Please call the speciality pharmacy at [phone] to complete the PA.   [facility] also [facility] for [place]. [facility] stated they need this information faxed to them as they can not accept it over the phone. Please fax it to: [phone]. The information they need is as follows: [place] grant 11/05/21 - 11/04/22  ID: [id number]  GRP: [id number]  BIN: [id number] PCN: [id number]    Care Gaps: Annual wellness visit in last year? Yes 06/03/2021 Most Recent BP  reading: 110/78 on 11/04/2021  If Diabetic: Most recent A1C reading: 6.7 on 11/04/2021 Last eye exam / retinopathy screening: Up to date Last diabetic foot exam: Up to date  Upcoming appointments: CCM appointment on 01/29/2022 at 3:45 with Emily Moon, CPP notified  Emily Moon, [place] [phone]  Time Spent: 28 Minutes

## 2021-12-23 ENCOUNTER — Telehealth: Payer: Self-pay

## 2021-12-23 NOTE — Telephone Encounter (Signed)
I spoke with you;pt said still fever 100.3 but is resting at this time and if condition worsens will go to UC or ED sending note to Dr Silvio Pate and Larene Beach CMA.

## 2021-12-23 NOTE — Telephone Encounter (Signed)
[place] Day - Client TELEPHONE ADVICE RECORD Access[facility]urse Patient Name: Emily Moon Gender: Female DOB: [birth year] Age: 61 Y 15 M 14 D Return Phone Number: [phone] (Primary) Address: City/ State/ Zip: [place]  [address] Client [facility] Day - Client Client Site [place] - Day Provider Viviana Simpler- MD Contact Type Call Who Is Calling Patient / Member / Family / Caregiver Call Type Triage / Clinical Relationship To Patient Self Return Phone Number [phone] (Primary) Chief Complaint Fever (non-urgent symptom) (greater than THREE MONTHS old) Reason for Call Symptomatic / Request for [place] states she needs to schedule an appointment. She has a fever of 102.5 and urinary frequency. Translation No Nurse Assessment Nurse: Phoebe Perch, RN, Dagoberto Date/Time (Eastern Time): 12/23/2021 3:43:50 PM Confirm and document reason for call. If symptomatic, describe symptoms. ---Caller states she has a fever, urinary frequency and not voiding as much. Latest temp is 101.8 (oral). Took Tylenol and Ibuprofen. Fever started 2 days ago. Urinary symptoms started 3-4 days ago. Does the patient have any new or worsening symptoms? ---Yes Will a triage be completed? ---Yes Related visit to physician within the last 2 weeks? ---No Does the PT have any chronic conditions? (i.e. diabetes, asthma, this includes High risk factors for pregnancy, etc.) ---Yes List chronic conditions. ---Diabetes. Cholesterol. MS. Is this a behavioral health or substance abuse call? ---No Guidelines Guideline Title Affirmed Question Affirmed Notes Nurse Date/Time Eilene Ghazi Time) Urinary Symptoms Fever > 100.4 F (38.0 C) Cervantes, RN, Dagoberto 12/23/2021 3:46:58 PM Disp. Time Eilene Ghazi Time) Disposition Final User 12/23/2021 3:52:52 PM See HCP within 4 Hours (or PCP triage) Yes  Cervantes, RN, Dagoberto PLEASE NOTE: All timestamps contained within this report are represented as [place] Standard Time. CONFIDENTIALTY NOTICE: This fax transmission is intended only for the addressee. It contains information that is legally privileged, confidential or otherwise protected from use or disclosure. If you are not the intended recipient, you are strictly prohibited from reviewing, disclosing, copying using or disseminating any of this information or taking any action in reliance on or regarding this information. If you have received this fax in error, please notify us immediately by telephone so that we can arrange for its return to [place]. Phone: [phone], Toll-Free: [phone], Fax: [phone] Page: 2 of 2 Call Id: [id number] [place] Disagree/Comply Disagree Caller Understands Yes PreDisposition Did not know what to do Care Advice Given Per Guideline SEE HCP (OR PCP TRIAGE) WITHIN 4 HOURS: * IF OFFICE WILL BE OPEN: You need to be seen within the next 3 or 4 hours. Call your doctor (or NP/PA) now or as soon as the office opens. * IF OFFICE WILL BE CLOSED AND NO PCP (PRIMARY CARE PROVIDER) SECOND-LEVEL TRIAGE: You need to be seen within the next 3 or 4 hours. A nearby Urgent Care Center [facility]) is often a good source of care. Another choice is to go to the ED. Go sooner if you become worse. * IBUPROFEN (E.G., MOTRIN, ADVIL): Take 400 mg (two 200 mg pills) by mouth every 6 hours. The most you should take is 6 pills a day (1,200 mg total). * For fevers above 101 F (38.3 C) take either acetaminophen or ibuprofen. * ACETAMINOPHEN - REGULAR STRENGTH TYLENOL: Take 650 mg (two 325 mg pills) by mouth every 4 to 6 hours as needed. Each Regular Strength Tylenol pill has 325 mg of acetaminophen. The most you should take is 10 pills a  day (3,250 mg total). Note: In [place], the maximum is 12 pills a day (3,900 mg total). CARE ADVICE given per Urinary Symptoms (Adult) guideline. * You become  worse CALL BACK IF: Comments User: Zenaida Deed, RN Date/Time (Eastern Time): 12/23/2021 3:53:38 PM Caller refused to go to UC/ED at this time. States she is tired from her MS and is going to rest. States she'll monitor her symptoms at home. Stated that if her fever got higher, she would go to the hospital. User: Zenaida Deed, RN Date/Time Eilene Ghazi Time): 12/23/2021 3:53:48 PM Called backline as per directives but unable to contact anyone. Referrals GO TO FACILITY REFUSE

## 2021-12-24 ENCOUNTER — Encounter: Payer: Self-pay | Admitting: Emergency Medicine

## 2021-12-24 ENCOUNTER — Emergency Department: Payer: PPO

## 2021-12-24 ENCOUNTER — Inpatient Hospital Stay
Admission: EM | Admit: 2021-12-24 | Discharge: 2021-12-27 | DRG: 872 | Disposition: A | Discharge status: Home / Self Care | Payer: PPO | Attending: Family Medicine | Admitting: Family Medicine

## 2021-12-24 ENCOUNTER — Other Ambulatory Visit: Payer: Self-pay

## 2021-12-24 DIAGNOSIS — G35 Multiple sclerosis: Secondary | ICD-10-CM | POA: Diagnosis present

## 2021-12-24 DIAGNOSIS — J449 Chronic obstructive pulmonary disease, unspecified: Secondary | ICD-10-CM | POA: Diagnosis not present

## 2021-12-24 DIAGNOSIS — Z882 Allergy status to sulfonamides status: Secondary | ICD-10-CM

## 2021-12-24 DIAGNOSIS — K219 Gastro-esophageal reflux disease without esophagitis: Secondary | ICD-10-CM | POA: Diagnosis present

## 2021-12-24 DIAGNOSIS — Z6834 Body mass index (BMI) 34.0-34.9, adult: Secondary | ICD-10-CM | POA: Diagnosis not present

## 2021-12-24 DIAGNOSIS — F3170 Bipolar disorder, currently in remission, most recent episode unspecified: Secondary | ICD-10-CM | POA: Diagnosis not present

## 2021-12-24 DIAGNOSIS — E871 Hypo-osmolality and hyponatremia: Secondary | ICD-10-CM

## 2021-12-24 DIAGNOSIS — Z794 Long term (current) use of insulin: Secondary | ICD-10-CM | POA: Diagnosis not present

## 2021-12-24 DIAGNOSIS — N390 Urinary tract infection, site not specified: Secondary | ICD-10-CM | POA: Diagnosis not present

## 2021-12-24 DIAGNOSIS — D509 Iron deficiency anemia, unspecified: Secondary | ICD-10-CM | POA: Diagnosis not present

## 2021-12-24 DIAGNOSIS — E876 Hypokalemia: Secondary | ICD-10-CM | POA: Diagnosis not present

## 2021-12-24 DIAGNOSIS — K828 Other specified diseases of gallbladder: Secondary | ICD-10-CM | POA: Diagnosis not present

## 2021-12-24 DIAGNOSIS — E7800 Pure hypercholesterolemia, unspecified: Secondary | ICD-10-CM | POA: Diagnosis present

## 2021-12-24 DIAGNOSIS — I10 Essential (primary) hypertension: Secondary | ICD-10-CM | POA: Diagnosis not present

## 2021-12-24 DIAGNOSIS — E785 Hyperlipidemia, unspecified: Secondary | ICD-10-CM | POA: Diagnosis present

## 2021-12-24 DIAGNOSIS — Z20822 Contact with and (suspected) exposure to covid-19: Secondary | ICD-10-CM | POA: Diagnosis not present

## 2021-12-24 DIAGNOSIS — F319 Bipolar disorder, unspecified: Secondary | ICD-10-CM | POA: Diagnosis not present

## 2021-12-24 DIAGNOSIS — Z0389 Encounter for observation for other suspected diseases and conditions ruled out: Secondary | ICD-10-CM | POA: Diagnosis not present

## 2021-12-24 DIAGNOSIS — G4700 Insomnia, unspecified: Secondary | ICD-10-CM | POA: Diagnosis not present

## 2021-12-24 DIAGNOSIS — R319 Hematuria, unspecified: Secondary | ICD-10-CM

## 2021-12-24 DIAGNOSIS — A419 Sepsis, unspecified organism: Secondary | ICD-10-CM | POA: Diagnosis not present

## 2021-12-24 DIAGNOSIS — K429 Umbilical hernia without obstruction or gangrene: Secondary | ICD-10-CM | POA: Diagnosis not present

## 2021-12-24 DIAGNOSIS — G35D Multiple sclerosis, unspecified: Secondary | ICD-10-CM | POA: Diagnosis present

## 2021-12-24 DIAGNOSIS — E119 Type 2 diabetes mellitus without complications: Secondary | ICD-10-CM | POA: Diagnosis not present

## 2021-12-24 DIAGNOSIS — A4151 Sepsis due to Escherichia coli [E. coli]: Secondary | ICD-10-CM | POA: Diagnosis not present

## 2021-12-24 DIAGNOSIS — Z79899 Other long term (current) drug therapy: Secondary | ICD-10-CM

## 2021-12-24 DIAGNOSIS — M47816 Spondylosis without myelopathy or radiculopathy, lumbar region: Secondary | ICD-10-CM | POA: Diagnosis not present

## 2021-12-24 DIAGNOSIS — N10 Acute tubulo-interstitial nephritis: Secondary | ICD-10-CM | POA: Diagnosis not present

## 2021-12-24 DIAGNOSIS — G894 Chronic pain syndrome: Secondary | ICD-10-CM | POA: Diagnosis present

## 2021-12-24 DIAGNOSIS — E1165 Type 2 diabetes mellitus with hyperglycemia: Secondary | ICD-10-CM

## 2021-12-24 DIAGNOSIS — Z888 Allergy status to other drugs, medicaments and biological substances status: Secondary | ICD-10-CM

## 2021-12-24 DIAGNOSIS — E669 Obesity, unspecified: Secondary | ICD-10-CM | POA: Diagnosis not present

## 2021-12-24 DIAGNOSIS — Z7984 Long term (current) use of oral hypoglycemic drugs: Secondary | ICD-10-CM | POA: Diagnosis not present

## 2021-12-24 DIAGNOSIS — Z87891 Personal history of nicotine dependence: Secondary | ICD-10-CM | POA: Diagnosis not present

## 2021-12-24 DIAGNOSIS — M797 Fibromyalgia: Secondary | ICD-10-CM | POA: Diagnosis present

## 2021-12-24 DIAGNOSIS — Z9841 Cataract extraction status, right eye: Secondary | ICD-10-CM

## 2021-12-24 DIAGNOSIS — Z9842 Cataract extraction status, left eye: Secondary | ICD-10-CM

## 2021-12-24 DIAGNOSIS — Z8249 Family history of ischemic heart disease and other diseases of the circulatory system: Secondary | ICD-10-CM

## 2021-12-24 DIAGNOSIS — R000 Tachycardia, unspecified: Secondary | ICD-10-CM | POA: Diagnosis not present

## 2021-12-24 DIAGNOSIS — Z961 Presence of intraocular lens: Secondary | ICD-10-CM | POA: Diagnosis present

## 2021-12-24 DIAGNOSIS — B9620 Unspecified Escherichia coli [E. coli] as the cause of diseases classified elsewhere: Secondary | ICD-10-CM | POA: Diagnosis present

## 2021-12-24 DIAGNOSIS — G4733 Obstructive sleep apnea (adult) (pediatric): Secondary | ICD-10-CM | POA: Diagnosis not present

## 2021-12-24 DIAGNOSIS — D649 Anemia, unspecified: Secondary | ICD-10-CM

## 2021-12-24 DIAGNOSIS — M4316 Spondylolisthesis, lumbar region: Secondary | ICD-10-CM | POA: Diagnosis not present

## 2021-12-24 DIAGNOSIS — Z96643 Presence of artificial hip joint, bilateral: Secondary | ICD-10-CM | POA: Diagnosis present

## 2021-12-24 LAB — URINALYSIS, ROUTINE W REFLEX MICROSCOPIC
Bilirubin Urine: NEGATIVE
Glucose, UA: NEGATIVE mg/dL
Ketones, ur: NEGATIVE mg/dL
Nitrite: POSITIVE — AB
Protein, ur: 100 mg/dL — AB
Specific Gravity, Urine: 1.021 (ref 1.005–1.030)
WBC, UA: >50 WBC/hpf — ABNORMAL HIGH (ref 0–5)
pH: 5 (ref 5.0–8.0)

## 2021-12-24 LAB — BASIC METABOLIC PANEL
Anion gap: 11 (ref 5–15)
BUN: 16 mg/dL (ref 6–20)
CO2: 25 mmol/L (ref 22–32)
Calcium: 8.6 mg/dL — ABNORMAL LOW (ref 8.9–10.3)
Chloride: 101 mmol/L (ref 98–111)
Creatinine, Ser: 1.25 mg/dL — ABNORMAL HIGH (ref 0.44–1.00)
GFR, Estimated: 50 mL/min — ABNORMAL LOW (ref >60)
Glucose, Bld: 184 mg/dL — ABNORMAL HIGH (ref 70–99)
Potassium: 3.6 mmol/L (ref 3.5–5.1)
Sodium: 137 mmol/L (ref 135–145)

## 2021-12-24 LAB — COMPREHENSIVE METABOLIC PANEL
ALT: 20 U/L (ref 0–44)
AST: 22 U/L (ref 15–41)
Albumin: 3.8 g/dL (ref 3.5–5.0)
Alkaline Phosphatase: 55 U/L (ref 38–126)
Anion gap: 8 (ref 5–15)
BUN: 15 mg/dL (ref 6–20)
CO2: 25 mmol/L (ref 22–32)
Calcium: 8.6 mg/dL — ABNORMAL LOW (ref 8.9–10.3)
Chloride: 100 mmol/L (ref 98–111)
Creatinine, Ser: 1.01 mg/dL — ABNORMAL HIGH (ref 0.44–1.00)
GFR, Estimated: >60 mL/min (ref >60)
Glucose, Bld: 159 mg/dL — ABNORMAL HIGH (ref 70–99)
Potassium: 3.6 mmol/L (ref 3.5–5.1)
Sodium: 133 mmol/L — ABNORMAL LOW (ref 135–145)
Total Bilirubin: 0.7 mg/dL (ref 0.3–1.2)
Total Protein: 7.3 g/dL (ref 6.5–8.1)

## 2021-12-24 LAB — CBC
HCT: 36.3 % (ref 36.0–46.0)
Hemoglobin: 11.3 g/dL — ABNORMAL LOW (ref 12.0–15.0)
MCH: 26.6 pg (ref 26.0–34.0)
MCHC: 31.1 g/dL (ref 30.0–36.0)
MCV: 85.4 fL (ref 80.0–100.0)
Platelets: 436 10*3/uL — ABNORMAL HIGH (ref 150–400)
RBC: 4.25 MIL/uL (ref 3.87–5.11)
RDW: 17.4 % — ABNORMAL HIGH (ref 11.5–15.5)
WBC: 14.3 10*3/uL — ABNORMAL HIGH (ref 4.0–10.5)
nRBC: 0 % (ref 0.0–0.2)

## 2021-12-24 LAB — APTT: aPTT: 41 seconds — ABNORMAL HIGH (ref 24–36)

## 2021-12-24 LAB — PROCALCITONIN: Procalcitonin: 0.72 ng/mL

## 2021-12-24 LAB — RESP PANEL BY RT-PCR (FLU A&B, COVID) ARPGX2
Influenza A by PCR: NEGATIVE
Influenza B by PCR: NEGATIVE
SARS Coronavirus 2 by RT PCR: NEGATIVE

## 2021-12-24 LAB — LACTIC ACID, PLASMA
Lactic Acid, Venous: 1.2 mmol/L (ref 0.5–1.9)
Lactic Acid, Venous: 1.2 mmol/L (ref 0.5–1.9)

## 2021-12-24 LAB — PROTIME-INR
INR: 1.2 (ref 0.8–1.2)
Prothrombin Time: 14.9 seconds (ref 11.4–15.2)

## 2021-12-24 LAB — POC URINE PREG, ED: Preg Test, Ur: NEGATIVE

## 2021-12-24 IMAGING — CT CT RENAL STONE PROTOCOL
2 of 4 series · 16 of 46 positions shown, 18 images · non-contrast
Comparison: [DATE]

CLINICAL DATA: Flank pain



[Series 2: stone full standard · axial · 0.93mm/px · z∈[-1278,-818]mm · 13 of 102 slices shown, 15 images]
[im 5/102  soft-tissue]
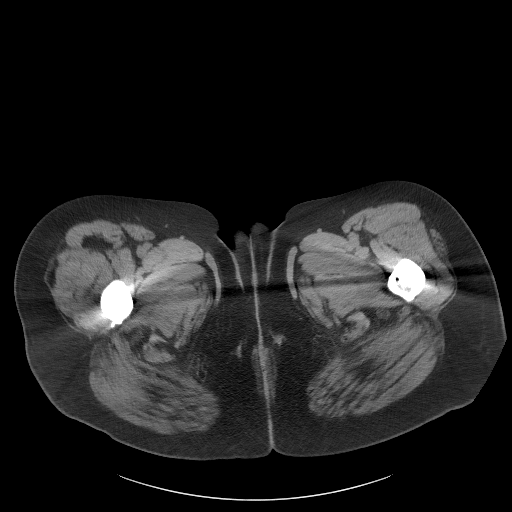
[im 5/102  bone]
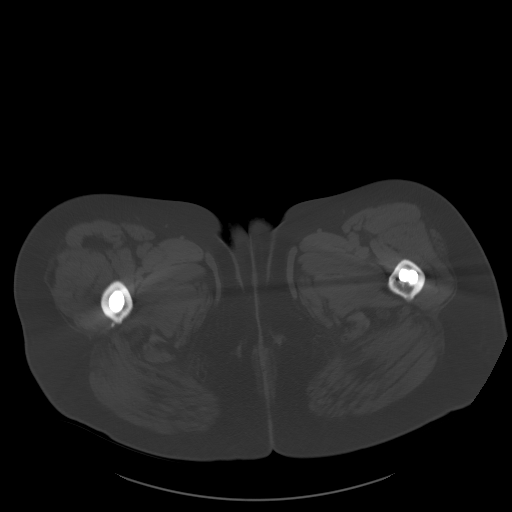
[im 13/102  soft-tissue]
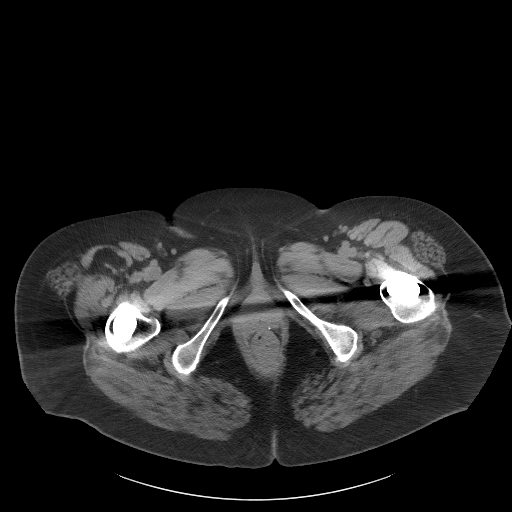
[im 22/102  soft-tissue]
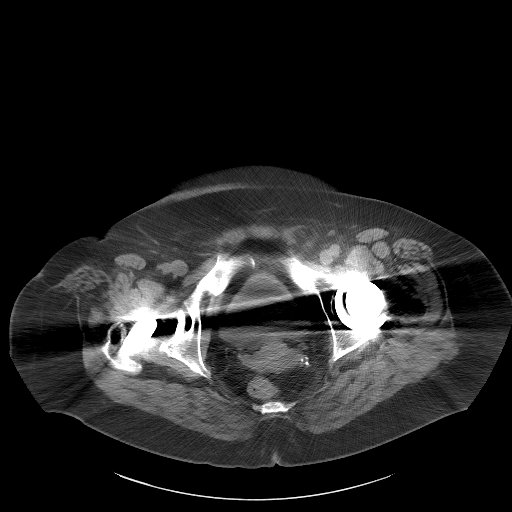
[im 30/102  soft-tissue]
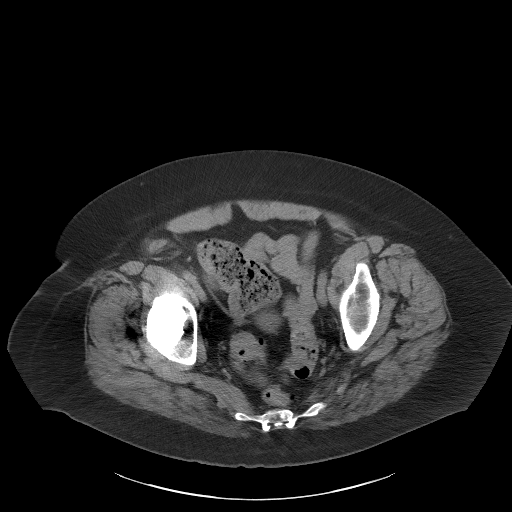
[im 34/102  soft-tissue]
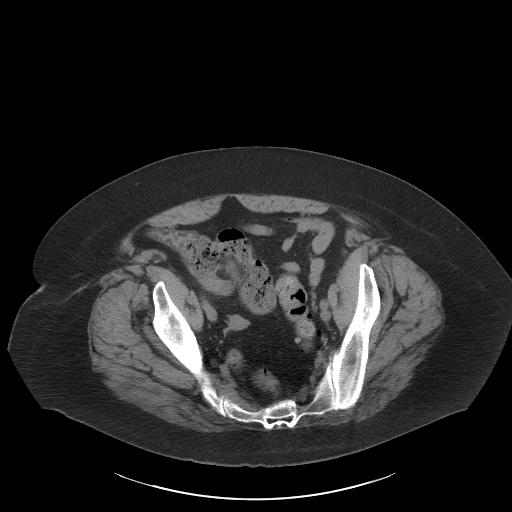
[im 43/102  soft-tissue]
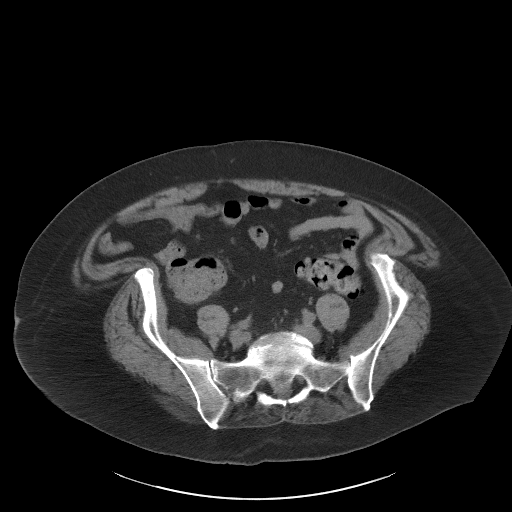
[im 51/102  soft-tissue]
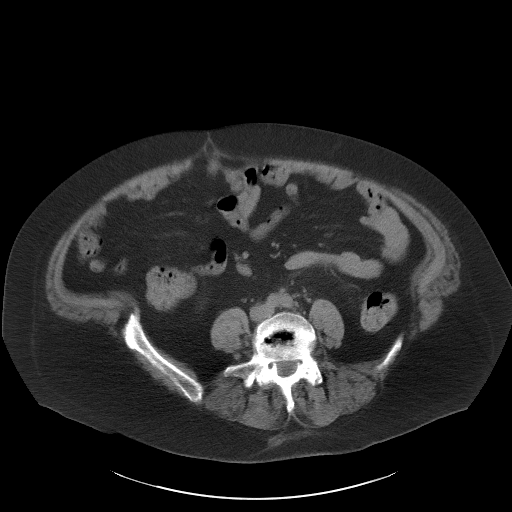
[im 59/102  soft-tissue]
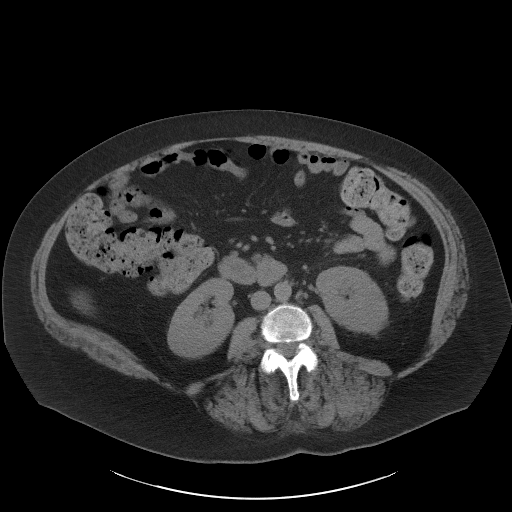
[im 68/102  soft-tissue]
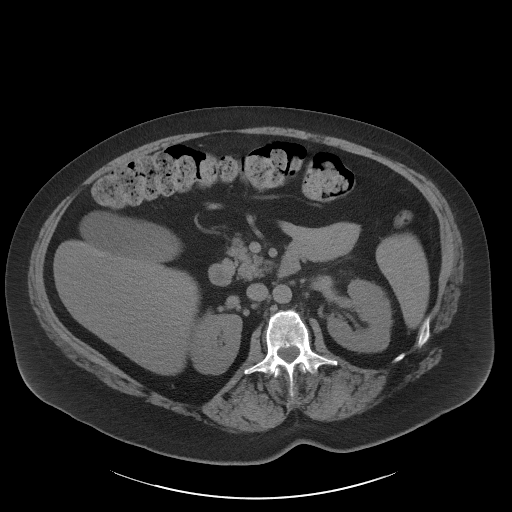
[im 68/102  bone]
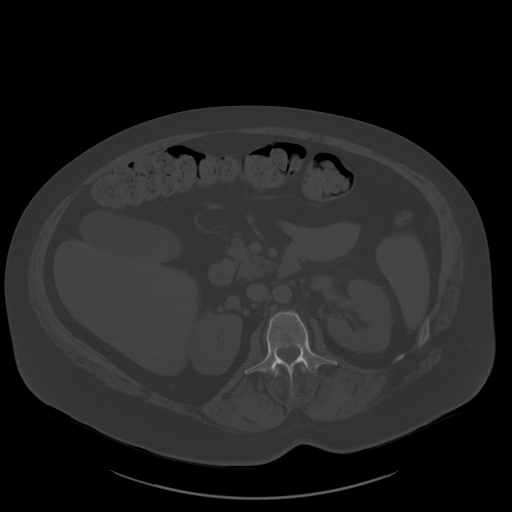
[im 72/102  soft-tissue]
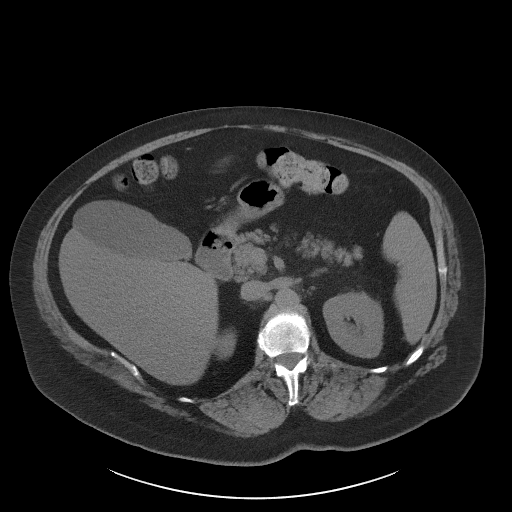
[im 80/102  soft-tissue]
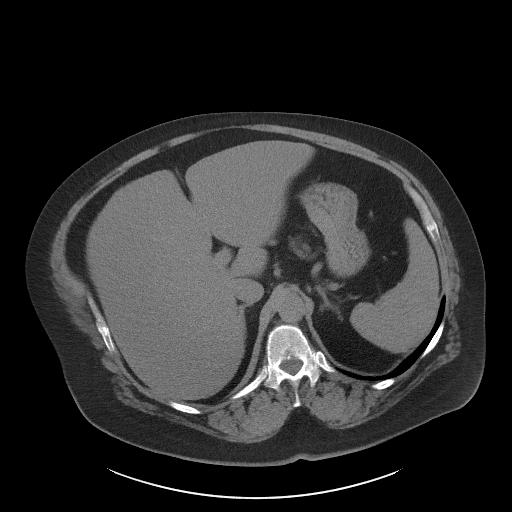
[im 89/102  soft-tissue]
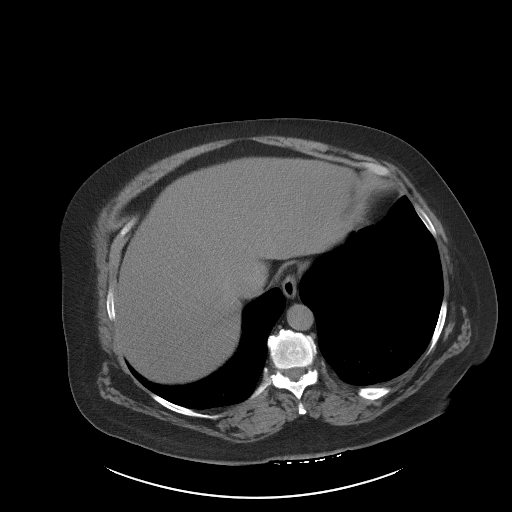
[im 97/102  soft-tissue]
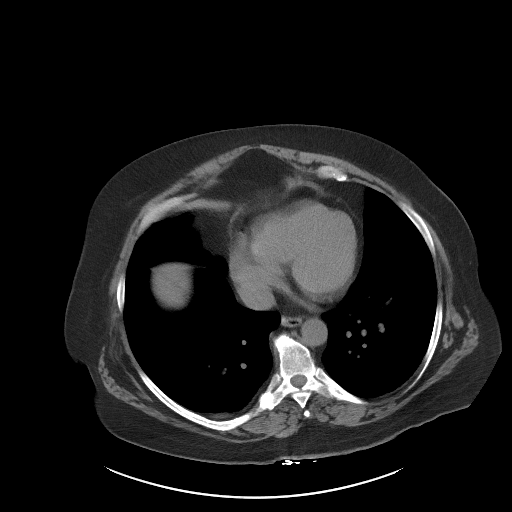

[Series 5: coronal · coronal · 0.87mm/px · 3 of 171 slices shown]
[im 57/171  soft-tissue]
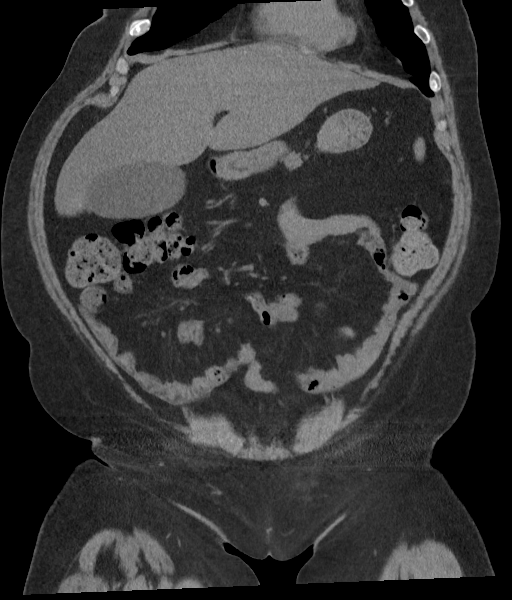
[im 76/171  soft-tissue]
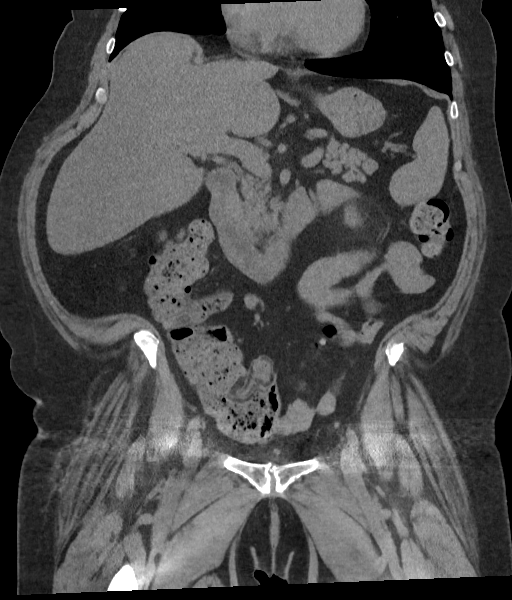
[im 95/171  soft-tissue]
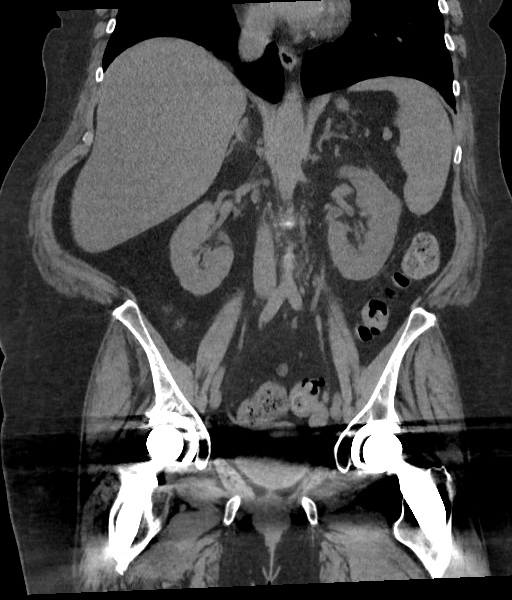

[16 of 46 positions shown; findings below may reference images not displayed]

FINDINGS: Lower chest: Unremarkable.

Hepatobiliary: Liver measures 21.4 cm in length. No focal
abnormality is seen. Gallbladder is distended. There is no wall
thickening in the gallbladder. There is no fluid around the
gallbladder. There is no dilation of bile ducts.

Pancreas: No focal abnormality is seen.

Spleen: Spleen measures 12.6 cm in maximum diameter.

Adrenals/Urinary Tract: Adrenals are not enlarged. There is no
hydronephrosis. There are no renal or ureteral stones. Urinary
bladder is partly obscured by severe beam hardening artifacts.

Stomach/Bowel: Stomach is not distended. Small bowel loops are not
dilated. Cecum is lower than usual in the position. There is a
tubular structure with air in the lumen in the right pelvic cavity,
possibly normal appendix. There is no significant wall thickening in
colon. Scattered diverticula are seen in colon without signs of
focal diverticulitis.

Vascular/Lymphatic: Unremarkable.

Reproductive: Evaluation is limited by severe beam hardening
artifacts from bilateral hip arthroplasty.

Other: There is no ascites or pneumoperitoneum. Small umbilical
hernia containing fat is seen.

Musculoskeletal: There is minimal anterolisthesis at L4-L5 level.
Degenerative changes are noted with disc space narrowing, bony spurs
and facet hypertrophy at multiple levels. There is previous
bilateral hip arthroplasty.
IMPRESSION: There is no evidence of intestinal obstruction or pneumoperitoneum.
There is no hydronephrosis. Appendix is not dilated. Appendix is
lower than usual in the pelvic cavity.

Gallbladder is distended. This may be due to fasting state or
suggest cholecystitis. There are no definite imaging signs of acute
cholecystitis. Please correlate with clinical symptoms and
laboratory findings.

Few diverticula are seen in the colon without evidence of focal
acute diverticulitis.

Enlarged liver and spleen.  Lumbar spondylosis.

Other findings as described in the body of the report.

## 2021-12-24 MED ORDER — METFORMIN HCL ER 500 MG PO TB24
1000.0000 mg | ORAL_TABLET | Freq: Every day | ORAL | Status: DC
  Filled 2021-12-24: qty 2

## 2021-12-24 MED ORDER — ENOXAPARIN SODIUM 60 MG/0.6ML IJ SOSY
0.5000 mg/kg | PREFILLED_SYRINGE | INTRAMUSCULAR | Status: DC
  Administered 2021-12-24 – 2021-12-26 (×3): 52.5 mg via SUBCUTANEOUS
  Filled 2021-12-24 (×3): qty 0.6

## 2021-12-24 MED ORDER — ONDANSETRON HCL 4 MG/2ML IJ SOLN: 4.0000 mg | Freq: Four times a day (QID) | INTRAMUSCULAR | Status: DC | PRN

## 2021-12-24 MED ORDER — KETOROLAC TROMETHAMINE 30 MG/ML IJ SOLN
30.0000 mg | Freq: Once | INTRAMUSCULAR | Status: AC
Start: 2021-12-24 — End: 2021-12-24
  Administered 2021-12-24: 30 mg via INTRAVENOUS
  Filled 2021-12-24: qty 1

## 2021-12-24 MED ORDER — LACTATED RINGERS IV BOLUS (SEPSIS)
1000.0000 mL | Freq: Once | INTRAVENOUS | Status: AC
  Administered 2021-12-24: 1000 mL via INTRAVENOUS

## 2021-12-24 MED ORDER — ACETAMINOPHEN 325 MG PO TABS
325.0000 mg | ORAL_TABLET | Freq: Four times a day (QID) | ORAL | Status: DC | PRN
  Administered 2021-12-24: 325 mg via ORAL
  Filled 2021-12-24: qty 1

## 2021-12-24 MED ORDER — ASPIRIN EC 81 MG PO TBEC
81.0000 mg | DELAYED_RELEASE_TABLET | Freq: Four times a day (QID) | ORAL | Status: DC | PRN
  Administered 2021-12-25: 81 mg via ORAL
  Filled 2021-12-24: qty 1

## 2021-12-24 MED ORDER — POLYETHYLENE GLYCOL 3350 17 G PO PACK
17.0000 g | PACK | Freq: Two times a day (BID) | ORAL | Status: DC
  Administered 2021-12-25 – 2021-12-27 (×4): 17 g via ORAL
  Filled 2021-12-24 (×5): qty 1

## 2021-12-24 MED ORDER — ONDANSETRON HCL 4 MG PO TABS: 4.0000 mg | ORAL_TABLET | Freq: Four times a day (QID) | ORAL | Status: DC | PRN

## 2021-12-24 MED ORDER — SENNA 8.6 MG PO TABS
1.0000 | ORAL_TABLET | Freq: Every day | ORAL | Status: AC
  Administered 2021-12-24 – 2021-12-25 (×2): 8.6 mg via ORAL
  Filled 2021-12-24 (×2): qty 1

## 2021-12-24 MED ORDER — SODIUM CHLORIDE 0.9 % IV SOLN
1.0000 g | Freq: Once | INTRAVENOUS | Status: AC
  Administered 2021-12-24: 1 g via INTRAVENOUS
  Filled 2021-12-24: qty 20

## 2021-12-24 MED ORDER — METOPROLOL TARTRATE 50 MG PO TABS
50.0000 mg | ORAL_TABLET | Freq: Two times a day (BID) | ORAL | Status: DC
  Administered 2021-12-24: 50 mg via ORAL
  Filled 2021-12-24: qty 1

## 2021-12-24 MED ORDER — LACTATED RINGERS IV SOLN: INTRAVENOUS | Status: DC

## 2021-12-24 MED ORDER — SODIUM CHLORIDE 0.9 % IV BOLUS (SEPSIS)
1000.0000 mL | Freq: Once | INTRAVENOUS | Status: AC
  Administered 2021-12-24: 1000 mL via INTRAVENOUS

## 2021-12-24 MED ORDER — SODIUM CHLORIDE 0.9 % IV SOLN
2.0000 g | INTRAVENOUS | Status: DC
  Administered 2021-12-25 – 2021-12-26 (×2): 2 g via INTRAVENOUS
  Filled 2021-12-24 (×3): qty 20

## 2021-12-24 MED ORDER — PANTOPRAZOLE SODIUM 40 MG PO TBEC
40.0000 mg | DELAYED_RELEASE_TABLET | Freq: Every day | ORAL | Status: DC
  Administered 2021-12-25 – 2021-12-27 (×3): 40 mg via ORAL
  Filled 2021-12-24 (×3): qty 1

## 2021-12-24 MED ORDER — SODIUM CHLORIDE 0.9 % IV SOLN: 1.0000 g | Freq: Three times a day (TID) | INTRAVENOUS | Status: DC

## 2021-12-24 NOTE — ED Triage Notes (Signed)
First Nurse Note: Arrives from [facility] for c/o no U/O in 3-4 days.  Has history of Kidney stones.

## 2021-12-24 NOTE — Progress Notes (Signed)
PHARMACIST - PHYSICIAN COMMUNICATION  CONCERNING:  Enoxaparin (Lovenox) for DVT Prophylaxis    RECOMMENDATION: Patient was prescribed enoxaparin 40 mg q24 hours for VTE prophylaxis.   Filed Weights   12/24/21 1448  Weight: 106.4 kg (234 lb 9.1 oz)    Body mass index is 34.64 kg/m.  Estimated Creatinine Clearance: 77.9 mL/min (A) (by C-G formula based on SCr of 1.01 mg/dL (H)).   Based on [place] patient is candidate for enoxaparin 0.5 mg/kg TBW SQ every 24 hours based on BMI > 30.   DESCRIPTION: Pharmacy has adjusted enoxaparin dose per [facility] policy.  Patient is now receiving enoxaparin 52.5 mg every 24 hours   Tawnya Crook, PharmD, BCPS Clinical Pharmacist 12/24/2021 8:13 PM

## 2021-12-24 NOTE — Telephone Encounter (Signed)
Called patient she is currently at ED now. She will let our office know if anything is needed from our office.

## 2021-12-24 NOTE — H&P (Signed)
History and Physical   Emily Moon YPP:[id number] DOB: [birth year] DOA: 12/24/2021  PCP: [name], NP  Outpatient Specialists: Dr. Casimiro Needle, behavioral health Patient coming from: Home  I have personally briefly reviewed patient's old medical records in [place].  Chief Concern: Decreased urine output  HPI: Ms. Emily Moon is a 61 year old female with history of depression, non-insulin-dependent diabetes mellitus type 2, GERD, obesity, who presents to the emergency department for chief concerns of poor urine output for 3 to 4 days.  Vitals in the emergency department showed Tmax of 103.2, respiration rate of 18, heart rate of 127, blood pressure 112/65, SPO2 of 95% on room air.  Serum sodium was 133, potassium 3.6, chloride of 100, bicarb 25, BUN of 15, serum creatinine of 1.01, nonfasting blood glucose 159, WBC 14.3, hemoglobin 11.3, platelets of 436.  Her lactic acid was 1.2.  INR was 1.2, PT was 14.9.  COVID/influenza A/influenza B PCR were negative.  UA showed trace leukocytes and positive for nitrites.  In the emergency department patient received ketorolac 30 mg IV one-time dose, sodium chloride 1 L bolus.  Patient was started on LR 150 mL/h.  Patient received 1 dose of meropenem.  She informed the ED provider that about a decade ago she received a medication that started with Cef and this caused her to feel itchy and swelling.  At bedside she is able to tell me her name, her age, she knows she is in the hospital.  She states that started having burning sensation with urination and that she became afraid to urinate. She endorse minimal to low urine outpatient for the last 3-4 days. She endorsed t max of 103.1 at home, she took advil and tylenol, and aspirin.   She denies chest pain. She endorses baseline shortness of breaht.   She states she has not had a good bowel movement in several days.   I asked her about her hospitalization in 2017 and ask if there  was any concerns with the medication she was given, specifically any reactions or throat swelling.  She states no she tolerated the medication however states it did not work, noting that infectious disease was consulted, and they recommended that she was given a penicillin medication.  She did not have any adverse responses to the medications given and it was noted that patient received ceftriaxone for 2 days, ID was consulted.  The urine culture became positive for Enterococcus, and it was recommended that antibiotic be transitioned to ampicillin.    She was discharged with amoxicillin 500 mg 3 times daily.  Social history: She lives at home by herself and has a caregiver who comes 3 days a week.  She is a former tobacco user, quitting in 2019. At her peak, she smoked 1/2 ppd. She endorses infrequent etoh use. She uses delta 8 and 9. She is not working and formerly worked at [employer]: Constitutional: no weight change, + fever ENT/Mouth: no sore throat, no rhinorrhea Eyes: no eye pain, no vision changes Cardiovascular: no chest pain, no dyspnea,  no edema, no palpitations Respiratory: no cough, no sputum, no wheezing Gastrointestinal: no nausea, no vomiting, no diarrhea, no constipation Genitourinary: no urinary incontinence, + dysuria, no hematuria Musculoskeletal: no arthralgias, no myalgias Skin: no skin lesions, no pruritus, Neuro: + weakness, no loss of consciousness, no syncope Psych: no anxiety, no depression, + decrease appetite Heme/Lymph: no bruising, no bleeding  ED Course: Discussed with emergency medicine provider, patient requiring hospitalization for  chief concerns of UTI and meeting sepsis.  Assessment/Plan  Principal Problem:   Sepsis ([place]) Active Problems:   GERD   Chronic pain syndrome   Obesity   Insomnia   Bipolar disorder (HCC)   OSA (obstructive sleep apnea)   Type 2 diabetes mellitus without complication, without long-term current use of insulin (HCC)    Essential hypertension   * Sepsis ([place]) Assessment & Plan - Patient meets sepsis criteria with increased heart rate, leukocytosis, fever, source of urine - Ceftriaxone 2 g IV ordered - Urine culture, blood cultures x2 have been ordered And are in process -Status post LR 1 L bolus, continue LR 150 mL/h for 20 hours per EDP - Patient is maintaining appropriate MAP - Admit to telemetry medical, observation  Cardiovascular and Mediastinum Essential hypertension Assessment & Plan - Resumed metoprolol tartrate 50 mg p.o. twice daily  Other Bipolar disorder (HCC) Assessment & Plan - Resumed home desipramine 25 mg daily - Resumed Lamictal 300 mg nightly  Insomnia Assessment & Plan - Resumed home trazodone 100 mg nightly  Chronic pain syndrome Assessment & Plan - Resumed home methocarbamol per home dosing - Resumed duloxetine 40 mg  Chart reviewed.   DVT prophylaxis: Enoxaparin Code Status: Full code Diet: Heart healthy/carb modified Family Communication: No Disposition Plan: Pending clinical course, anticipate less than 2 night stay Consults called: None at this time Admission status: Telemetry medical, observation  Past Medical History:  Diagnosis Date   Arthritis    osteo   Asthma    Bipolar disorder ([place]) 05/21/2014   Cataracts, bilateral    COPD (chronic obstructive pulmonary disease) ([place])    DDD (degenerative disc disease), cervical    also back   Depression    Diabetes mellitus without complication (HCC)    Dizziness    Positional   Edema    feet/legs   Fibromyalgia syndrome    Fungal infection    Finger nails   GERD (gastroesophageal reflux disease)    Gout    Heart palpitations    Hip dysplasia, congenital 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   History of kidney stones    Hypercholesterolemia    Multiple sclerosis (HCC)    weakness   Osteoporosis    osteoarthritis   Pneumonia    PONV (postoperative nausea and vomiting)    no problem after cataract surgery   Prediabetes  07/22/2018   Psoriasis    Shortness of breath dyspnea    wheezing   Sleep apnea 2012   sleep study / slight, no interventions   Urinary frequency    Weight gain 06/21/2014   Past Surgical History:  Procedure Laterality Date   CATARACT EXTRACTION W/PHACO Left 05/21/2015   Procedure: CATARACT EXTRACTION PHACO AND INTRAOCULAR LENS PLACEMENT ([place]);  Surgeon: Birder Robson, MD;  Location: [facility] ORS;  Service: Ophthalmology;  Laterality: Left;  [place] 00:35 AP% 22.9 CDE 8.11 fluid pack lot #[id number]   CATARACT EXTRACTION W/PHACO Right 06/04/2015   Procedure: CATARACT EXTRACTION PHACO AND INTRAOCULAR LENS PLACEMENT (IOC);  Surgeon: Birder Robson, MD;  Location: [facility] ORS;  Service: Ophthalmology;  Laterality: Right;  US:00:48 AP%: 10.5 CDE:5.08 Fluid lot #[id number]   CYSTOSCOPY/URETEROSCOPY/HOLMIUM LASER/STENT PLACEMENT Bilateral 09/22/2016   Procedure: CYSTOSCOPY/URETEROSCOPY/HOLMIUM LASER/STENT PLACEMENT;  Surgeon: Hollice Espy, MD;  Location: [facility] ORS;  Service: Urology;  Laterality: Bilateral;   EYE SURGERY  2015   tissue biopsy   FOOT SURGERY  2015   JOINT REPLACEMENT Left 2013   hip replacement   LITHOTRIPSY     PTOSIS REPAIR Bilateral  02/18/2016   Procedure: BILATERAL PTOSIS REPAIR UPPER EYELIDS;  Surgeon: Karle Starch, MD;  Location: [place];  Service: Ophthalmology;  Laterality: Bilateral;  LEAVE PT EARLY AM   thumb surgery Right    TONSILLECTOMY  1973   TOTAL HIP ARTHROPLASTY Right 07/02/2021   Procedure: TOTAL HIP ARTHROPLASTY ANTERIOR APPROACH;  Surgeon: Gaynelle Arabian, MD;  Location: [facility];  Service: Orthopedics;  Laterality: Right;   Social History:  reports that she quit smoking about 4 years ago. Her smoking use included cigarettes. She has a 25.00 pack-year smoking history. She has never used smokeless tobacco. She reports current drug use. Drugs: Methylphenidate and Marijuana. She reports that she does not drink alcohol.  Allergies  Allergen Reactions    Albuterol Shortness Of Breath and Other (See Comments)    Makes pt feel jittery/ tacycardic   Crestor [Rosuvastatin] Other (See Comments)    Joint pain, muscle pain, and hair loss   Halcion [Triazolam] Other (See Comments)    Dizziness,headaches,bladder problems   Levaquin [Levofloxacin In D5w] Diarrhea and Itching    Shoulder pain   Naproxen Sodium Swelling    Patient tolerates in small doses   Tylenol [Acetaminophen] Swelling    Patient tolerates in small doses   Cefaclor Other (See Comments)    Doesn't remember---unsure if actually allergic    Sulfa Antibiotics Itching    Unsure of reaction possibly itching   Tramadol Itching and Nausea And Vomiting   Zinc Other (See Comments)    constipation  constipation    Aripiprazole Other (See Comments)    Muscle tension/cramping   Diclofenac Sodium Rash    "made very sick"   Ibuprofen Swelling    Patient tolerates in small doses   Family History  Problem Relation Age of Onset   Cancer Father        Abdomen with mastasis   Cancer Mother    Heart disease Mother    Kidney disease Neg Hx    Bladder Cancer Neg Hx    Prostate cancer Neg Hx    Kidney cancer Neg Hx    Family history: Family history reviewed and not pertinent.  Prior to Admission medications   Medication Sig Start Date End Date Taking? Authorizing Provider  Azelastine HCl 137 MCG/SPRAY SOLN PLACE 2 SPRAYS INTO BOTH NOSTRILS 2 (TWO) TIMES DAILY 07/16/21   [name], NP  B Complex-Biotin-FA (BIG 100, BIOTIN, PO) Take by mouth.    [provider]  cetirizine (ZYRTEC) 10 MG tablet Take 10 mg by mouth daily.    [provider]  clindamycin (CLEOCIN T) 1 % external solution Apply to scalp once or twice a day prn bumps 11/18/21   Moye, [place], MD  clobetasol (OLUX) 0.05 % topical foam Apply to affected areas at arms, legs, trunk twice a day up to 2 weeks. Avoid applying to face, groin, and axilla. Use as directed. Risk of skin atrophy with long-term  use reviewed. 09/09/21   Moye, [place], MD  clobetasol (TEMOVATE) 0.05 % external solution Mix clobetasol solution with  CeraVe cream Use twice daily to affected areas.Avoid Face, groin and underarm 02/22/20   Moye, [place], MD  Continuous Blood Gluc Sensor (FREESTYLE LIBRE 14 DAY SENSOR) MISC PLACE INTO SKIN FOR CONTINUOUS CHECK OF BLOOD SUGAR DAILY 06/19/21   [name], NP  desipramine (NORPRAMIN) 25 MG tablet Take 1 tablet (25 mg total) by mouth daily. 12/03/21   Norma Fredrickson, MD  Dimethyl Fumarate 120 & 240 MG  [place]  10/14/21   [provider]  diphenhydrAMINE (BENADRYL) 2 % cream Apply topically 3 (three) times daily as needed for itching.    [provider]  Dulaglutide (TRULICITY) 3 GY/1.8HU SOPN Inject 3 mg as directed once a week. For diabetes. 09/02/21   [name], NP  DULoxetine (CYMBALTA) 20 MG capsule Take 2 capsules (40 mg total) by mouth daily. 12/03/21   Plovsky, Berneta Sages, MD  Evolocumab (REPATHA SURECLICK) 314 MG/ML SOAJ Inject 1 Dose into the skin every 14 (fourteen) days. 05/13/21   [name] Sable, MD  famotidine (PEPCID) 20 MG tablet Take 1 tablet (20 mg total) by mouth 2 (two) times daily. 06/04/21   [name], NP  fenofibrate (TRICOR) 145 MG tablet Take 1 tablet (145 mg total) by mouth daily. 10/15/21   [name] Sable, MD  fluocinonide (LIDEX) 0.05 % external solution Apply 1 application topically 2 (two) times daily as needed. Apply to scalp twice daily as need for itch 02/22/20   Moye, [place], MD  hydrocortisone 2.5 % cream Apply 1 application topically 2 (two) times daily as needed (underarm/groin rash). 01/11/20   [provider]  hydrOXYzine (VISTARIL) 50 MG capsule Take 2 capsules (100 mg total) by mouth at bedtime. TAKE 1 CAPSLE BY MOUTH AT NOON, 1 AT 6 PM, AND 1 AS NEEDED 12/03/21   Norma Fredrickson, MD  lamoTRIgine (LAMICTAL) 150 MG tablet Take 2 tablets (300 mg total) by mouth at bedtime. 12/03/21   Plovsky, Berneta Sages, MD   levalbuterol (XOPENEX HFA) 45 MCG/ACT inhaler INHALE 1 TO 2 PUFFS BY MOUTH EVERY 6 HOURS AS NEEDED FOR WHEEZE 11/20/20   [name], NP  Lysine 500 MG TABS Take by mouth.    [provider]  meclizine (ANTIVERT) 25 MG tablet Take 0.5-1 tablets (12.5-25 mg total) by mouth 3 (three) times daily as needed for dizziness (sedation caution). 03/10/21   Tonia Ghent, MD  metFORMIN (GLUCOPHAGE-XR) 500 MG 24 hr tablet Take 2 tablets (1,000 mg total) by mouth daily with breakfast. 11/04/21   Venia Carbon, MD  methocarbamol (ROBAXIN) 500 MG tablet TAKE 1-2 TABLETS (500-1,000 MG TOTAL) BY MOUTH AT BEDTIME AS NEEDED FOR MUSCLE SPASMS. 10/23/21   [name], NP  metoprolol tartrate (LOPRESSOR) 50 MG tablet Take 1 tablet (50 mg total) by mouth 2 (two) times daily. 08/22/21 11/20/21  Furth, Cadence H, PA-C  neomycin-bacitracin-polymyxin (NEOSPORIN) [phone] ointment Apply 1 application topically 4 (four) times daily as needed (for cut/scrapes.).    [provider]  pantoprazole (PROTONIX) 40 MG tablet Take 1 tablet (40 mg total) by mouth daily. For heartburn 06/04/21   [name], NP  polyethylene glycol powder (GLYCOLAX/MIRALAX) powder MIX 17 GRAMS (1 CAPFUL) WITH 4-8 OZ OF LIQUID AND TAKE BY MOUTH TWICE DAILY AS NEEDED 11/24/17   [name], NP  polyvinyl alcohol (LIQUIFILM TEARS) 1.4 % ophthalmic solution Place 2 drops into both eyes at bedtime.    [provider]  temazepam (RESTORIL) 30 MG capsule TAKE ONE TO TWO CAPSULES BY MOUTH NIGHTLY AT BEDTIME 12/03/21   Plovsky, Berneta Sages, MD  Tiotropium Bromide-Olodaterol (STIOLTO RESPIMAT) 2.5-2.5 MCG/ACT AERS Inhale 2 puffs into the lungs daily. 12/09/21   Parrett, Fonnie Mu, NP  traZODone (DESYREL) 50 MG tablet Take 2 tablets (100 mg total) by mouth at bedtime. 12/03/21   Plovsky, Berneta Sages, MD  valACYclovir (VALTREX) 1000 MG tablet TAKE 1 TABLET (1,000 MG TOTAL) BY MOUTH DAILY. FOR HERPES OUTBREAK PREVENTION 10/02/21  [name], NP   Physical Exam: Vitals:   12/24/21 2242 12/24/21 2300 12/24/21 2315 12/24/21 2332  BP:  116/77 112/81 117/67  Pulse:  (!) 102 (!) 105 (!) 102  Resp:  (!) 23 17 18   Temp: (!) 102.8 F (39.3 C)   (!) 102.4 F (39.1 C)  TempSrc: Oral   Oral  SpO2:  95% 95% 95%  Weight:      Height:       Constitutional: appears older than chronological age, chronically ill, NAD, calm, comfortable Eyes: PERRL, lids and conjunctivae normal ENMT: Mucous membranes are moist. Posterior pharynx clear of any exudate or lesions. Age-appropriate dentition. Hearing appropriate Neck: normal, supple, no masses, no thyromegaly Respiratory: clear to auscultation bilaterally, no wheezing, no crackles. Normal respiratory effort. No accessory muscle use.  Cardiovascular: Regular rate and rhythm, no murmurs / rubs / gallops. No extremity edema. 2+ pedal pulses. No carotid bruits.  Abdomen: Obese abdomen, no tenderness, no masses palpated, no hepatosplenomegaly. Bowel sounds positive.  Musculoskeletal: no clubbing / cyanosis. No joint deformity upper and lower extremities. Good ROM, no contractures, no atrophy. Normal muscle tone.  Skin: no rashes, lesions, ulcers. No induration Neurologic: Sensation intact. Strength 5/5 in all 4.  Psychiatric: Normal judgment and insight. Alert and oriented x 3. Normal mood.   EKG: independently reviewed, showing sinus tachycardia with rate of 110, QTc 436  Chest x-ray on Admission: I personally reviewed and I agree with radiologist reading as below.  DG Chest Port 1 View  Result Date: 12/24/2021 CLINICAL DATA:  Possible sepsis. EXAM: PORTABLE CHEST 1 VIEW COMPARISON:  Chest x-ray dated June 30, 2021. FINDINGS: The heart size and mediastinal contours are within normal limits. Both lungs are clear. The visualized skeletal structures are unremarkable. IMPRESSION: No active disease. Electronically Signed   By: Titus Dubin M.D.   On: 12/24/2021 17:44   CT  Renal Stone Study  Result Date: 12/24/2021 CLINICAL DATA:  Flank pain EXAM: CT ABDOMEN AND PELVIS WITHOUT CONTRAST TECHNIQUE: Multidetector CT imaging of the abdomen and pelvis was performed following the standard protocol without IV contrast. RADIATION DOSE REDUCTION: This exam was performed according to the departmental dose-optimization program which includes automated exposure control, adjustment of the mA and/or kV according to patient size and/or use of iterative reconstruction technique. COMPARISON:  08/31/2018 FINDINGS: Lower chest: Unremarkable. Hepatobiliary: Liver measures 21.4 cm in length. No focal abnormality is seen. Gallbladder is distended. There is no wall thickening in the gallbladder. There is no fluid around the gallbladder. There is no dilation of bile ducts. Pancreas: No focal abnormality is seen. Spleen: Spleen measures 12.6 cm in maximum diameter. Adrenals/Urinary Tract: Adrenals are not enlarged. There is no hydronephrosis. There are no renal or ureteral stones. Urinary bladder is partly obscured by severe beam hardening artifacts. Stomach/Bowel: Stomach is not distended. Small bowel loops are not dilated. Cecum is lower than usual in the position. There is a tubular structure with air in the lumen in the right pelvic cavity, possibly normal appendix. There is no significant wall thickening in colon. Scattered diverticula are seen in colon without signs of focal diverticulitis. Vascular/Lymphatic: Unremarkable. Reproductive: Evaluation is limited by severe beam hardening artifacts from bilateral hip arthroplasty. Other: There is no ascites or pneumoperitoneum. Small umbilical hernia containing fat is seen. Musculoskeletal: There is minimal anterolisthesis at L4-L5 level. Degenerative changes are noted with disc space narrowing, bony spurs and facet hypertrophy at multiple levels. There is previous bilateral hip arthroplasty. IMPRESSION: There is no  evidence of intestinal obstruction or  pneumoperitoneum. There is no hydronephrosis. Appendix is not dilated. Appendix is lower than usual in the pelvic cavity. Gallbladder is distended. This may be due to fasting state or suggest cholecystitis. There are no definite imaging signs of acute cholecystitis. Please correlate with clinical symptoms and laboratory findings. Few diverticula are seen in the colon without evidence of focal acute diverticulitis. Enlarged liver and spleen.  Lumbar spondylosis. Other findings as described in the body of the report. Electronically Signed   By: Elmer Picker M.D.   On: 12/24/2021 17:47    Labs on Admission: I have personally reviewed following labs  CBC: Recent Labs  Lab 12/24/21 1507  WBC 14.3*  HGB 11.3*  HCT 36.3  MCV 85.4  PLT 884*   Basic Metabolic Panel: Recent Labs  Lab 12/24/21 1507 12/24/21 1801  NA 137 133*  K 3.6 3.6  CL 101 100  CO2 25 25  GLUCOSE 184* 159*  BUN 16 15  CREATININE 1.25* 1.01*  CALCIUM 8.6* 8.6*   GFR: Estimated Creatinine Clearance: 77.9 mL/min (A) (by C-G formula based on SCr of 1.01 mg/dL (H)).  Liver Function Tests: Recent Labs  Lab 12/24/21 1801  AST 22  ALT 20  ALKPHOS 55  BILITOT 0.7  PROT 7.3  ALBUMIN 3.8   Coagulation Profile: Recent Labs  Lab 12/24/21 1801  INR 1.2   Urine analysis:    Component Value Date/Time   COLORURINE AMBER (A) 12/24/2021 1644   APPEARANCEUR HAZY (A) 12/24/2021 1644   APPEARANCEUR Clear 09/12/2018 0842   LABSPEC 1.021 12/24/2021 1644   PHURINE 5.0 12/24/2021 1644   GLUCOSEU NEGATIVE 12/24/2021 1644   HGBUR SMALL (A) 12/24/2021 1644   HGBUR moderate 11/13/2010 0945   BILIRUBINUR NEGATIVE 12/24/2021 1644   BILIRUBINUR 1+ 09/19/2021 1041   BILIRUBINUR Negative 09/12/2018 0842   KETONESUR NEGATIVE 12/24/2021 1644   PROTEINUR 100 (A) 12/24/2021 1644   UROBILINOGEN 1.0 09/19/2021 1041   UROBILINOGEN 0.2 11/13/2010 0945   NITRITE POSITIVE (A) 12/24/2021 1644   LEUKOCYTESUR TRACE (A) 12/24/2021  1644   Dr. Tobie Poet [facility]  If 7PM-7AM, please contact overnight-coverage provider If 7AM-7PM, please contact day coverage provider [email or web address]  12/25/2021, 12:13 AM

## 2021-12-24 NOTE — ED Notes (Signed)
Provided ice water and applesauce per request.

## 2021-12-24 NOTE — ED Notes (Signed)
Pt informed of needing urine sample. Pt given instructions on collection.

## 2021-12-24 NOTE — Sepsis Progress Note (Signed)
Notified bedside nurse of need to draw and administer antibiotics, lactic acid, blood cultures, and fluid bolus.   

## 2021-12-24 NOTE — Progress Notes (Signed)
Pharmacy Antibiotic Note  Emily Moon is a 61 y.o. female admitted on 12/24/2021. Pharmacy has been consulted for meropenem dosing for UTI.  Plan: Meropenem 1 g IV q8h  Height: 5\' 9"  (175.3 cm) Weight: 106.4 kg (234 lb 9.1 oz) IBW/kg (Calculated) : 66.2  Temp (24hrs), Avg:101.8 F (38.8 C), Min:100.4 F (38 C), Max:103.2 F (39.6 C)  Recent Labs  Lab 12/24/21 1507 12/24/21 1801  WBC 14.3*  --   CREATININE 1.25* 1.01*  LATICACIDVEN  --  1.2    Estimated Creatinine Clearance: 77.9 mL/min (A) (by C-G formula based on SCr of 1.01 mg/dL (H)).    Allergies  Allergen Reactions   Albuterol Shortness Of Breath and Other (See Comments)    Makes pt feel jittery/ tacycardic   Crestor [Rosuvastatin] Other (See Comments)    Joint pain, muscle pain, and hair loss   Halcion [Triazolam] Other (See Comments)    Dizziness,headaches,bladder problems   Levaquin [Levofloxacin In D5w] Diarrhea and Itching    Shoulder pain   Naproxen Sodium Swelling    Patient tolerates in small doses   Tylenol [Acetaminophen] Swelling    Patient tolerates in small doses   Cefaclor Other (See Comments)    Doesn't remember---unsure if actually allergic    Sulfa Antibiotics Itching    Unsure of reaction possibly itching   Tramadol Itching and Nausea And Vomiting   Zinc Other (See Comments)    constipation  constipation    Aripiprazole Other (See Comments)    Muscle tension/cramping   Diclofenac Sodium Rash    "made very sick"   Ibuprofen Swelling    Patient tolerates in small doses    Antimicrobials this admission: Meropenem 2/8 >>   Microbiology results: 2/8 BCx: pending 2/8 UCx: pending    Thank you for allowing pharmacy to be a part of this patients care.  Tawnya Crook, PharmD, BCPS Clinical Pharmacist 12/24/2021 8:18 PM

## 2021-12-24 NOTE — Telephone Encounter (Signed)
Noted. Can we get her scheduled for an office visit for these symptoms?

## 2021-12-24 NOTE — ED Notes (Signed)
Bladder scan resulted at 52mL.

## 2021-12-24 NOTE — ED Notes (Signed)
Pt alert, NAD, calm, interactive, family at [facility]. Pt was unable to void/ give urine sample.

## 2021-12-24 NOTE — ED Provider Notes (Signed)
[facility] Provider Note   Event Date/Time   First MD Initiated Contact with Patient 12/24/21 1638     (approximate)  History   No chief complaint on file.  HPI Emily Moon is a 61 y.o. female who presents for dysuria as well as decreased urinary output over the last 3 days.  Patient states that she does have a history of kidney stones and feels that this is similar to previous kidney stones that she has had in the past however she denies having decreased urinary output with kidney stones in the past.  Patient also endorses associated dysuria and fever to 103 F today.  Patient denies any abdominal pain    Physical Exam   Triage Vital Signs: ED Triage Vitals  Enc Vitals Group     BP 12/24/21 1451 112/65     Pulse Rate 12/24/21 1451 (!) 127     Resp 12/24/21 1451 18     Temp 12/24/21 1451 (!) 100.4 F (38 C)     Temp Source 12/24/21 1451 Oral     SpO2 12/24/21 1451 95 %     Weight 12/24/21 1448 234 lb 9.1 oz (106.4 kg)     Height 12/24/21 1448 5\' 9"  (1.753 m)     Head Circumference --      Peak Flow --      Pain Score 12/24/21 1447 8     Pain Loc --      Pain Edu? --      Excl. in [place]? --     Most recent vital signs: Vitals:   12/24/21 1945 12/24/21 1947  BP: 134/87   Pulse: (!) 131   Resp: 19   Temp:  (!) 103.2 F (39.6 C)  SpO2: 100%     General: Awake, oriented x4 CV:  Good peripheral perfusion.  Resp:  Normal effort.  Abd:  No distention.  Other:  Middle-aged Caucasian female laying in bed in no distress   ED Results / Procedures / Treatments   Labs (all labs ordered are listed, but only abnormal results are displayed) Labs Reviewed  URINALYSIS, ROUTINE W REFLEX MICROSCOPIC - Abnormal; Notable for the following components:      Result Value   Color, Urine AMBER (*)    APPearance HAZY (*)    Hgb urine dipstick SMALL (*)    Protein, ur 100 (*)    Nitrite POSITIVE (*)    Leukocytes,Ua TRACE (*)    WBC, UA >50 (*)     Bacteria, UA MANY (*)    All other components within normal limits  CBC - Abnormal; Notable for the following components:   WBC 14.3 (*)    Hemoglobin 11.3 (*)    RDW 17.4 (*)    Platelets 436 (*)    All other components within normal limits  BASIC METABOLIC PANEL - Abnormal; Notable for the following components:   Glucose, Bld 184 (*)    Creatinine, Ser 1.25 (*)    Calcium 8.6 (*)    GFR, Estimated 50 (*)    All other components within normal limits  COMPREHENSIVE METABOLIC PANEL - Abnormal; Notable for the following components:   Sodium 133 (*)    Glucose, Bld 159 (*)    Creatinine, Ser 1.01 (*)    Calcium 8.6 (*)    All other components within normal limits  APTT - Abnormal; Notable for the following components:   aPTT 41 (*)    All other components within  normal limits  RESP PANEL BY RT-PCR (FLU A&B, COVID) ARPGX2  CULTURE, BLOOD (ROUTINE X 2)  CULTURE, BLOOD (ROUTINE X 2)  URINE CULTURE  LACTIC ACID, PLASMA  PROTIME-INR  LACTIC ACID, PLASMA  URINALYSIS, COMPLETE (UACMP) WITH MICROSCOPIC  POC URINE PREG, ED     EKG ED ECG REPORT I, Naaman Plummer, the attending physician, personally viewed and interpreted this ECG.  Date: 12/24/2021 EKG Time: 1831 Rate: 110 Rhythm: Tachycardic AV paced sinus rhythm QRS Axis: normal Intervals: normal ST/T Wave abnormalities: normal Narrative Interpretation: Tachycardic AV paced sinus rhythm.  No evidence of acute ischemia   RADIOLOGY ED MD interpretation: One-view portable chest x-ray shows no evidence of acute abnormalities including no pneumonia, pneumothorax, or widened mediastinum  CT of the abdomen and pelvis with renal stone protocol shows a distended gallbladder which may be due to a fasting state or cholecystitis  Agree with radiology assessment  Official radiology report(s): DG Chest Port 1 View  Result Date: 12/24/2021 CLINICAL DATA:  Possible sepsis. EXAM: PORTABLE CHEST 1 VIEW COMPARISON:  Chest x-ray dated  June 30, 2021. FINDINGS: The heart size and mediastinal contours are within normal limits. Both lungs are clear. The visualized skeletal structures are unremarkable. IMPRESSION: No active disease. Electronically Signed   By: Titus Dubin M.D.   On: 12/24/2021 17:44   CT Renal Stone Study  Result Date: 12/24/2021 CLINICAL DATA:  Flank pain EXAM: CT ABDOMEN AND PELVIS WITHOUT CONTRAST TECHNIQUE: Multidetector CT imaging of the abdomen and pelvis was performed following the standard protocol without IV contrast. RADIATION DOSE REDUCTION: This exam was performed according to the departmental dose-optimization program which includes automated exposure control, adjustment of the mA and/or kV according to patient size and/or use of iterative reconstruction technique. COMPARISON:  08/31/2018 FINDINGS: Lower chest: Unremarkable. Hepatobiliary: Liver measures 21.4 cm in length. No focal abnormality is seen. Gallbladder is distended. There is no wall thickening in the gallbladder. There is no fluid around the gallbladder. There is no dilation of bile ducts. Pancreas: No focal abnormality is seen. Spleen: Spleen measures 12.6 cm in maximum diameter. Adrenals/Urinary Tract: Adrenals are not enlarged. There is no hydronephrosis. There are no renal or ureteral stones. Urinary bladder is partly obscured by severe beam hardening artifacts. Stomach/Bowel: Stomach is not distended. Small bowel loops are not dilated. Cecum is lower than usual in the position. There is a tubular structure with air in the lumen in the right pelvic cavity, possibly normal appendix. There is no significant wall thickening in colon. Scattered diverticula are seen in colon without signs of focal diverticulitis. Vascular/Lymphatic: Unremarkable. Reproductive: Evaluation is limited by severe beam hardening artifacts from bilateral hip arthroplasty. Other: There is no ascites or pneumoperitoneum. Small umbilical hernia containing fat is seen.  Musculoskeletal: There is minimal anterolisthesis at L4-L5 level. Degenerative changes are noted with disc space narrowing, bony spurs and facet hypertrophy at multiple levels. There is previous bilateral hip arthroplasty. IMPRESSION: There is no evidence of intestinal obstruction or pneumoperitoneum. There is no hydronephrosis. Appendix is not dilated. Appendix is lower than usual in the pelvic cavity. Gallbladder is distended. This may be due to fasting state or suggest cholecystitis. There are no definite imaging signs of acute cholecystitis. Please correlate with clinical symptoms and laboratory findings. Few diverticula are seen in the colon without evidence of focal acute diverticulitis. Enlarged liver and spleen.  Lumbar spondylosis. Other findings as described in the body of the report. Electronically Signed   By: Prudy Feeler.D.  On: 12/24/2021 17:47      PROCEDURES:  Critical Care performed: No  .1-3 Lead EKG Interpretation Performed by: Naaman Plummer, MD Authorized by: Naaman Plummer, MD     Interpretation: normal     ECG rate:  130   ECG rate assessment: tachycardic     Rhythm: sinus tachycardia     Ectopy: none     Conduction: normal    MEDICATIONS ORDERED IN ED: Medications  lactated ringers infusion ( Intravenous New Bag/Given 12/24/21 1833)  meropenem (MERREM) 1 g in sodium chloride 0.9 % 100 mL IVPB (0 g Intravenous Stopped 12/24/21 1947)  sodium chloride 0.9 % bolus 1,000 mL (0 mLs Intravenous Stopped 12/24/21 1947)  ketorolac (TORADOL) 30 MG/ML injection 30 mg (30 mg Intravenous Given 12/24/21 1956)   IMPRESSION / MDM / ASSESSMENT AND PLAN / ED COURSE  I reviewed the triage vital signs and the nursing notes.                             Differential diagnosis includes, but is not limited to, UTI, pyelonephritis, urosepsis, urinary obstruction, urinary retention  The patient is on the cardiac monitor to evaluate for evidence of arrhythmia and/or significant heart  rate changes.  Patient is a 61 year old female who presents with the above-stated past medical history and history of present illness. Laboratory evaluation significant for: CBC with white blood cell count 14 BMP with mildly elevated creatinine to 1.25 Urinalysis showing nitrite positive with leukocytes, white blood cells, and many bacteria  CT of the abdomen and pelvis shows mildly distended gallbladder however patient states that she has been fasting all day and denies any right upper quadrant abdominal pain Patient treated empirically with meropenem as she is allergic to cefaclor and cannot use ceftriaxone.  Given patient meeting sepsis criteria as well as mild AKI, patient will require admission to the internal medicine service for further evaluation and management.  Dispo: Admit to medicine    FINAL CLINICAL IMPRESSION(S) / ED DIAGNOSES   Final diagnoses:  Urinary tract infection with hematuria, site unspecified   Rx / DC Orders   ED Discharge Orders     None        Note:  This document was prepared using Dragon voice recognition software and may include unintentional dictation errors.   Naaman Plummer, MD 12/24/21 [phone]

## 2021-12-24 NOTE — Hospital Course (Signed)
Ms. Emily Moon is a 61 year old female with history of depression, non-insulin-dependent diabetes mellitus type 2, GERD, obesity, who presents to the emergency department for chief concerns of poor urine output for 3 to 4 days.  Vitals in the emergency department showed Tmax of 103.2, respiration rate of 18, heart rate of 127, blood pressure 112/65, SPO2 of 95% on room air.  Serum sodium was 133, potassium 3.6, chloride of 100, bicarb 25, BUN of 15, serum creatinine of 1.01, nonfasting blood glucose 159, WBC 14.3, hemoglobin 11.3, platelets of 436.  Her lactic acid was 1.2.  INR was 1.2, PT was 14.9.  COVID/influenza A/influenza B PCR were negative.  UA showed trace leukocytes and positive for nitrites.  In the emergency department patient received ketorolac 30 mg IV one-time dose, sodium chloride 1 L bolus.  Patient was started on LR 150 mL/h.  Patient received 1 dose of meropenem.  She informed the ED provider that about a decade ago she received a medication that started with Cef and this caused her to feel itchy and swelling.

## 2021-12-24 NOTE — Telephone Encounter (Signed)
Noted. Will await ED notes. 

## 2021-12-24 NOTE — Progress Notes (Signed)
PHARMACY -  BRIEF ANTIBIOTIC NOTE   Pharmacy has received consult(s) for meropenem from an ED provider.  The patient's profile has been reviewed for ht/wt/allergies/indication/available labs.    One time order(s) placed for meropenem 1 g  Further antibiotics/pharmacy consults should be ordered by admitting physician if indicated.                       Thank you,  Tawnya Crook, PharmD, BCPS Clinical Pharmacist 12/24/2021 5:17 PM

## 2021-12-24 NOTE — Sepsis Progress Note (Signed)
Code sepsis protocol being monitored by eLink. 

## 2021-12-24 NOTE — Progress Notes (Signed)
CODE SEPSIS - PHARMACY COMMUNICATION  **Broad Spectrum Antibiotics should be administered within 1 hour of Sepsis diagnosis**  Time Code Sepsis Called/Page Received: 1713  Antibiotics Ordered: meropenem  Time of 1st antibiotic administration: 1814  Additional action taken by pharmacy: n/a, RN contacted by [place] nurse following code sepsis  Tawnya Crook, PharmD, BCPS Clinical Pharmacist 12/24/2021 6:19 PM

## 2021-12-25 ENCOUNTER — Encounter: Payer: Self-pay | Admitting: Internal Medicine

## 2021-12-25 DIAGNOSIS — I10 Essential (primary) hypertension: Secondary | ICD-10-CM | POA: Diagnosis present

## 2021-12-25 DIAGNOSIS — Z7984 Long term (current) use of oral hypoglycemic drugs: Secondary | ICD-10-CM | POA: Diagnosis not present

## 2021-12-25 DIAGNOSIS — G4700 Insomnia, unspecified: Secondary | ICD-10-CM | POA: Diagnosis present

## 2021-12-25 DIAGNOSIS — Z8249 Family history of ischemic heart disease and other diseases of the circulatory system: Secondary | ICD-10-CM | POA: Diagnosis not present

## 2021-12-25 DIAGNOSIS — E871 Hypo-osmolality and hyponatremia: Secondary | ICD-10-CM | POA: Diagnosis present

## 2021-12-25 DIAGNOSIS — A419 Sepsis, unspecified organism: Secondary | ICD-10-CM | POA: Diagnosis present

## 2021-12-25 DIAGNOSIS — D509 Iron deficiency anemia, unspecified: Secondary | ICD-10-CM | POA: Diagnosis present

## 2021-12-25 DIAGNOSIS — J449 Chronic obstructive pulmonary disease, unspecified: Secondary | ICD-10-CM | POA: Diagnosis present

## 2021-12-25 DIAGNOSIS — D649 Anemia, unspecified: Secondary | ICD-10-CM

## 2021-12-25 DIAGNOSIS — G894 Chronic pain syndrome: Secondary | ICD-10-CM | POA: Diagnosis present

## 2021-12-25 DIAGNOSIS — E119 Type 2 diabetes mellitus without complications: Secondary | ICD-10-CM | POA: Diagnosis present

## 2021-12-25 DIAGNOSIS — Z79899 Other long term (current) drug therapy: Secondary | ICD-10-CM | POA: Diagnosis not present

## 2021-12-25 DIAGNOSIS — Z794 Long term (current) use of insulin: Secondary | ICD-10-CM | POA: Diagnosis not present

## 2021-12-25 DIAGNOSIS — N10 Acute tubulo-interstitial nephritis: Secondary | ICD-10-CM | POA: Diagnosis present

## 2021-12-25 DIAGNOSIS — F319 Bipolar disorder, unspecified: Secondary | ICD-10-CM | POA: Diagnosis present

## 2021-12-25 DIAGNOSIS — Z6834 Body mass index (BMI) 34.0-34.9, adult: Secondary | ICD-10-CM | POA: Diagnosis not present

## 2021-12-25 DIAGNOSIS — G35 Multiple sclerosis: Secondary | ICD-10-CM | POA: Diagnosis present

## 2021-12-25 DIAGNOSIS — Z20822 Contact with and (suspected) exposure to covid-19: Secondary | ICD-10-CM | POA: Diagnosis present

## 2021-12-25 DIAGNOSIS — E669 Obesity, unspecified: Secondary | ICD-10-CM | POA: Diagnosis present

## 2021-12-25 DIAGNOSIS — E7800 Pure hypercholesterolemia, unspecified: Secondary | ICD-10-CM | POA: Diagnosis present

## 2021-12-25 DIAGNOSIS — F3170 Bipolar disorder, currently in remission, most recent episode unspecified: Secondary | ICD-10-CM | POA: Diagnosis not present

## 2021-12-25 DIAGNOSIS — A4151 Sepsis due to Escherichia coli [E. coli]: Secondary | ICD-10-CM | POA: Diagnosis not present

## 2021-12-25 DIAGNOSIS — E876 Hypokalemia: Secondary | ICD-10-CM | POA: Diagnosis not present

## 2021-12-25 DIAGNOSIS — K219 Gastro-esophageal reflux disease without esophagitis: Secondary | ICD-10-CM | POA: Diagnosis present

## 2021-12-25 DIAGNOSIS — Z87891 Personal history of nicotine dependence: Secondary | ICD-10-CM | POA: Diagnosis not present

## 2021-12-25 DIAGNOSIS — G4733 Obstructive sleep apnea (adult) (pediatric): Secondary | ICD-10-CM | POA: Diagnosis present

## 2021-12-25 DIAGNOSIS — B9620 Unspecified Escherichia coli [E. coli] as the cause of diseases classified elsewhere: Secondary | ICD-10-CM | POA: Diagnosis present

## 2021-12-25 HISTORY — DX: Acute pyelonephritis: N10

## 2021-12-25 LAB — URINALYSIS, COMPLETE (UACMP) WITH MICROSCOPIC
Bacteria, UA: NONE SEEN
Bilirubin Urine: NEGATIVE
Glucose, UA: NEGATIVE mg/dL
Ketones, ur: NEGATIVE mg/dL
Nitrite: POSITIVE — AB
Protein, ur: 30 mg/dL — AB
Specific Gravity, Urine: 1.01 (ref 1.005–1.030)
WBC, UA: >50 WBC/hpf — ABNORMAL HIGH (ref 0–5)
pH: 5 (ref 5.0–8.0)

## 2021-12-25 LAB — CBC WITH DIFFERENTIAL/PLATELET
Abs Immature Granulocytes: 0.08 10*3/uL — ABNORMAL HIGH (ref 0.00–0.07)
Basophils Absolute: 0.1 10*3/uL (ref 0.0–0.1)
Basophils Relative: 0 %
Eosinophils Absolute: 0 10*3/uL (ref 0.0–0.5)
Eosinophils Relative: 0 %
HCT: 29.3 % — ABNORMAL LOW (ref 36.0–46.0)
Hemoglobin: 9.4 g/dL — ABNORMAL LOW (ref 12.0–15.0)
Immature Granulocytes: 1 %
Lymphocytes Relative: 6 %
Lymphs Abs: 0.8 10*3/uL (ref 0.7–4.0)
MCH: 26.5 pg (ref 26.0–34.0)
MCHC: 32.1 g/dL (ref 30.0–36.0)
MCV: 82.5 fL (ref 80.0–100.0)
Monocytes Absolute: 1.7 10*3/uL — ABNORMAL HIGH (ref 0.1–1.0)
Monocytes Relative: 14 %
Neutro Abs: 9.9 10*3/uL — ABNORMAL HIGH (ref 1.7–7.7)
Neutrophils Relative %: 79 %
Platelets: 386 10*3/uL (ref 150–400)
RBC: 3.55 MIL/uL — ABNORMAL LOW (ref 3.87–5.11)
RDW: 17.2 % — ABNORMAL HIGH (ref 11.5–15.5)
WBC: 12.5 10*3/uL — ABNORMAL HIGH (ref 4.0–10.5)
nRBC: 0 % (ref 0.0–0.2)

## 2021-12-25 LAB — PROTIME-INR
INR: 1.2 (ref 0.8–1.2)
Prothrombin Time: 15.5 seconds — ABNORMAL HIGH (ref 11.4–15.2)

## 2021-12-25 LAB — BASIC METABOLIC PANEL
Anion gap: 9 (ref 5–15)
BUN: 16 mg/dL (ref 6–20)
CO2: 23 mmol/L (ref 22–32)
Calcium: 7.9 mg/dL — ABNORMAL LOW (ref 8.9–10.3)
Chloride: 101 mmol/L (ref 98–111)
Creatinine, Ser: 1.12 mg/dL — ABNORMAL HIGH (ref 0.44–1.00)
GFR, Estimated: 57 mL/min — ABNORMAL LOW (ref >60)
Glucose, Bld: 152 mg/dL — ABNORMAL HIGH (ref 70–99)
Potassium: 3.6 mmol/L (ref 3.5–5.1)
Sodium: 133 mmol/L — ABNORMAL LOW (ref 135–145)

## 2021-12-25 LAB — GLUCOSE, CAPILLARY
Glucose-Capillary: 114 mg/dL — ABNORMAL HIGH (ref 70–99)
Glucose-Capillary: 109 mg/dL — ABNORMAL HIGH (ref 70–99)

## 2021-12-25 LAB — HIV ANTIBODY (ROUTINE TESTING W REFLEX): HIV Screen 4th Generation wRfx: NONREACTIVE

## 2021-12-25 MED ORDER — TEMAZEPAM 15 MG PO CAPS
15.0000 mg | ORAL_CAPSULE | Freq: Every day | ORAL | Status: DC
  Administered 2021-12-25: 15 mg via ORAL
  Filled 2021-12-25: qty 1

## 2021-12-25 MED ORDER — FENOFIBRATE 160 MG PO TABS
160.0000 mg | ORAL_TABLET | Freq: Every day | ORAL | Status: DC
  Administered 2021-12-25 – 2021-12-27 (×3): 160 mg via ORAL
  Filled 2021-12-25 (×4): qty 1

## 2021-12-25 MED ORDER — UMECLIDINIUM BROMIDE 62.5 MCG/ACT IN AEPB
1.0000 | INHALATION_SPRAY | Freq: Every day | RESPIRATORY_TRACT | Status: DC
  Administered 2021-12-25 – 2021-12-27 (×2): 1 via RESPIRATORY_TRACT
  Filled 2021-12-25: qty 7

## 2021-12-25 MED ORDER — AZELASTINE HCL 137 MCG/SPRAY NA SOLN: 1.0000 | Freq: Two times a day (BID) | NASAL | Status: DC

## 2021-12-25 MED ORDER — DESIPRAMINE HCL 25 MG PO TABS: 25.0000 mg | ORAL_TABLET | Freq: Every day | ORAL | Status: DC

## 2021-12-25 MED ORDER — HYDROXYZINE HCL 50 MG PO TABS
100.0000 mg | ORAL_TABLET | Freq: Every day | ORAL | Status: DC
  Administered 2021-12-25 – 2021-12-26 (×2): 100 mg via ORAL
  Filled 2021-12-25 (×3): qty 2

## 2021-12-25 MED ORDER — LORATADINE 10 MG PO TABS
10.0000 mg | ORAL_TABLET | Freq: Every day | ORAL | Status: DC
  Administered 2021-12-26 – 2021-12-27 (×2): 10 mg via ORAL
  Filled 2021-12-25 (×2): qty 1

## 2021-12-25 MED ORDER — ACETAMINOPHEN 325 MG PO TABS
650.0000 mg | ORAL_TABLET | Freq: Four times a day (QID) | ORAL | Status: DC | PRN
  Administered 2021-12-25 – 2021-12-26 (×4): 650 mg via ORAL
  Filled 2021-12-25 (×4): qty 2

## 2021-12-25 MED ORDER — INSULIN ASPART 100 UNIT/ML IJ SOLN: 0.0000 [IU] | Freq: Two times a day (BID) | INTRAMUSCULAR | Status: DC

## 2021-12-25 MED ORDER — IBUPROFEN 400 MG PO TABS
600.0000 mg | ORAL_TABLET | ORAL | Status: DC | PRN
  Administered 2021-12-25: 600 mg via ORAL
  Filled 2021-12-25: qty 2

## 2021-12-25 MED ORDER — ARFORMOTEROL TARTRATE 15 MCG/2ML IN NEBU
15.0000 ug | INHALATION_SOLUTION | Freq: Two times a day (BID) | RESPIRATORY_TRACT | Status: DC
  Administered 2021-12-25 – 2021-12-27 (×5): 15 ug via RESPIRATORY_TRACT
  Filled 2021-12-25 (×7): qty 2

## 2021-12-25 MED ORDER — TRAZODONE HCL 100 MG PO TABS
100.0000 mg | ORAL_TABLET | Freq: Every day | ORAL | Status: DC
  Administered 2021-12-25 – 2021-12-26 (×3): 100 mg via ORAL
  Filled 2021-12-25 (×3): qty 1

## 2021-12-25 MED ORDER — AZELASTINE HCL 0.1 % NA SOLN
1.0000 | Freq: Two times a day (BID) | NASAL | Status: DC | PRN
  Administered 2021-12-25 – 2021-12-26 (×3): 1 via NASAL
  Filled 2021-12-25 (×2): qty 30

## 2021-12-25 MED ORDER — LAMOTRIGINE 100 MG PO TABS
300.0000 mg | ORAL_TABLET | Freq: Every day | ORAL | Status: DC
  Administered 2021-12-25 – 2021-12-26 (×2): 300 mg via ORAL
  Filled 2021-12-25 (×3): qty 3

## 2021-12-25 MED ORDER — MECLIZINE HCL 25 MG PO TABS
12.5000 mg | ORAL_TABLET | Freq: Three times a day (TID) | ORAL | Status: DC | PRN
  Filled 2021-12-25: qty 1

## 2021-12-25 MED ORDER — DULOXETINE HCL 20 MG PO CPEP
40.0000 mg | ORAL_CAPSULE | Freq: Every day | ORAL | Status: DC
  Administered 2021-12-25 – 2021-12-27 (×3): 40 mg via ORAL
  Filled 2021-12-25 (×4): qty 2

## 2021-12-25 MED ORDER — LYSINE 500 MG PO TABS: 200.0000 mg | ORAL_TABLET | Freq: Every day | ORAL | Status: DC

## 2021-12-25 MED ORDER — LORATADINE 10 MG PO TABS
10.0000 mg | ORAL_TABLET | Freq: Every day | ORAL | Status: DC | PRN
  Administered 2021-12-25: 10 mg via ORAL
  Filled 2021-12-25: qty 1

## 2021-12-25 MED ORDER — METHOCARBAMOL 500 MG PO TABS
500.0000 mg | ORAL_TABLET | Freq: Every evening | ORAL | Status: DC | PRN
  Administered 2021-12-25: 500 mg via ORAL
  Filled 2021-12-25: qty 2

## 2021-12-25 MED ORDER — LEVALBUTEROL HCL 0.63 MG/3ML IN NEBU
0.6300 mg | INHALATION_SOLUTION | Freq: Four times a day (QID) | RESPIRATORY_TRACT | Status: DC | PRN
  Filled 2021-12-25: qty 3

## 2021-12-25 MED ORDER — FAMOTIDINE 20 MG PO TABS
20.0000 mg | ORAL_TABLET | Freq: Two times a day (BID) | ORAL | Status: DC
Start: 2021-12-25 — End: 2021-12-27
  Administered 2021-12-25 – 2021-12-27 (×6): 20 mg via ORAL
  Filled 2021-12-25 (×6): qty 1

## 2021-12-25 NOTE — Assessment & Plan Note (Addendum)
Hgb stable.  No bleeding. Iron stores low - Start oral iron - Follow up with PCP for iron deficiency GI work up

## 2021-12-25 NOTE — Assessment & Plan Note (Addendum)
-   Resumed home trazodone 100 mg nightly

## 2021-12-25 NOTE — Plan of Care (Signed)
Patient arrived to room 149 in stable condition. Aox4. Reports 6/10 left flank pain. Plan of care reviewed with patient. Room/unit orientation completed. Call bell within reach.  PLAN OF CARE ONGOING Problem: Education: Goal: Knowledge of General Education information will improve Description: Including pain rating scale, medication(s)/side effects and non-pharmacologic comfort measures Outcome: Progressing   Problem: Health Behavior/Discharge Planning: Goal: Ability to manage health-related needs will improve Outcome: Progressing   Problem: Clinical Measurements: Goal: Ability to maintain clinical measurements within normal limits will improve Outcome: Progressing Goal: Will remain free from infection Outcome: Progressing Goal: Diagnostic test results will improve Outcome: Progressing Goal: Respiratory complications will improve Outcome: Progressing Goal: Cardiovascular complication will be avoided Outcome: Progressing   Problem: Activity: Goal: Risk for activity intolerance will decrease Outcome: Progressing   Problem: Nutrition: Goal: Adequate nutrition will be maintained Outcome: Progressing   Problem: Coping: Goal: Level of anxiety will decrease Outcome: Progressing   Problem: Elimination: Goal: Will not experience complications related to bowel motility Outcome: Progressing Goal: Will not experience complications related to urinary retention Outcome: Progressing   Problem: Pain Managment: Goal: General experience of comfort will improve Outcome: Progressing   Problem: Safety: Goal: Ability to remain free from injury will improve Outcome: Progressing   Problem: Skin Integrity: Goal: Risk for impaired skin integrity will decrease Outcome: Progressing   Problem: Urinary Elimination: Goal: Signs and symptoms of infection will decrease Outcome: Progressing

## 2021-12-25 NOTE — Assessment & Plan Note (Addendum)
Glucose good - Hold metformin and Trulicity - Continue sliding scale corrections

## 2021-12-25 NOTE — Assessment & Plan Note (Signed)
-   Resumed home methocarbamol per home dosing - Resumed duloxetine 40 mg

## 2021-12-25 NOTE — Assessment & Plan Note (Addendum)
Na unchanged -Hold IV fluids

## 2021-12-25 NOTE — Assessment & Plan Note (Signed)
-   Continue Pepcid and pantoprazole

## 2021-12-25 NOTE — Assessment & Plan Note (Addendum)
Well compensated - Continue Lamictal

## 2021-12-25 NOTE — Assessment & Plan Note (Addendum)
Blood pressure normal - Resume metoprolol

## 2021-12-25 NOTE — Progress Notes (Signed)
°  Progress Note   Patient: Emily Moon:[id number] DOB: 15-Apr-1961 DOA: 12/24/2021     0 DOS: the patient was seen and examined on 12/25/2021       Brief hospital course: Emily Moon is a 62 y.o. F with IDDM, obesity, and depression who presented for decreased urine output for 3-4 days.  In the ER, Temp 103F, HR 127.  Cr 1.0, WBC 14, lactate and coags normal.  UA shoewd leukocytes.  Started on fluids and antibiotics.      Assessment and Plan: * Sepsis ([place])- (present on admission) Presented with tachycardia, leukocytosis, fever.  No endorgan damage.  Still tachycardic this morning. - Follow blood and urine cultures - Continue Rocephin  Normocytic anemia Hemoglobin trending down, no clinical bleeding. - Check iron, B12, folate, reticulocytes  Hyponatremia - Continue IV fluid  Essential hypertension- (present on admission) Blood pressure soft - Hold metoprolol  Type 2 diabetes mellitus without complication, without long-term current use of insulin (HCC) Glucose normal - Hold metformin and Trulicity - Start sliding scale corrections  OSA (obstructive sleep apnea)- (present on admission) Not on CPAP  Bipolar disorder ([place])- (present on admission) - Continue Lamictal  Insomnia- (present on admission) - Continue trazodone and Restoril  Obesity- (present on admission) BMI 34  Chronic pain syndrome- (present on admission) - Continue Cymbalta  GERD- (present on admission) - Continue Pepcid and pantoprazole        Subjective: Patient is feeling somewhat better, still weak, still tired.  Mild fever overnight.  No vomiting, no confusion, still some dysuria.  Physical Exam: Vitals:   12/25/21 0630 12/25/21 0743 12/25/21 0803 12/25/21 1151  BP:   (!) 97/57 120/65  Pulse:   100 (!) 115  Resp: 18  20 18   Temp:   98.6 F (37 C) (!) 100.6 F (38.1 C)  TempSrc:      SpO2:  96% 94% 98%  Weight:      Height:       Adult female, sitting up in bed, eating  lunch, no acute distress. Heart rate elevated, no murmurs, no lower extremity edema Respiratory effort normal, lungs clear without rales or wheezes Abdomen some mild tenderness in the right side, no voluntary guarding, no rigidity or rebound Attention normal, affect appropriate, judgment Syprine normal. Moves upper extremities with normal strength and coordination, extraocular movements intact, face symmetric  Data Reviewed: My review of labs and imaging is notable for sodium of 133, creatinine 1.12, stable Hemogram shows hemoglobin down to 9, white blood cell count improving. Renal CT shows no signs of hydronephrosis or mass.  Family Communication: Sister at the bedside, friend as well.  Disposition: Status is: Inpatient Remains inpatient appropriate because: She remains tachycardic, on IV fluids and IV antibiotics  Likely if fever free tomorrow, HR and BP normalize, and culture results return, will d/c tomorrow          Planned Discharge Destination: Home      Author: Edwin Dada, MD 12/25/2021 1:43 PM  For on call review [email or web address].

## 2021-12-25 NOTE — Assessment & Plan Note (Addendum)
-   Resumed home desipramine 25 mg daily - Resumed Lamictal 300 mg nightly

## 2021-12-25 NOTE — Assessment & Plan Note (Signed)
-   Resumed metoprolol tartrate 50 mg p.o. twice daily

## 2021-12-25 NOTE — Progress Notes (Addendum)
Heart rate 120s on tele monitor. LR 523ml bolus ordered by Otilio Miu, NP  12/25/21 0430  Assess: MEWS Score  BP (!) 96/59  Pulse Rate (!) 110  Resp 16  Level of Consciousness Alert  SpO2 93 %  O2 Device Room Air  Assess: MEWS Score  MEWS Temp 0  MEWS Systolic 1  MEWS Pulse 1  MEWS RR 0  MEWS LOC 0  MEWS Score 2  MEWS Score Color Yellow  Assess: if the MEWS score is Yellow or Red  Were vital signs taken at a resting state? Yes  Focused Assessment No change from prior assessment  Does the patient meet 2 or more of the SIRS criteria? Yes  Does the patient have a confirmed or suspected source of infection? Yes  Provider and Rapid Response Notified? Yes (Provider aware)  MEWS guidelines implemented *See Row Information* Yes  Treat  MEWS Interventions Other (Comment) (LR 570ml bolus given)  Take Vital Signs  Increase Vital Sign Frequency  Yellow: Q 2hr X 2 then Q 4hr X 2, if remains yellow, continue Q 4hrs  Escalate  MEWS: Escalate Yellow: discuss with charge nurse/RN and consider discussing with provider and RRT  Notify: Charge Nurse/RN  Name of Charge Nurse/RN Notified Systems developer  Date Charge Nurse/RN Notified 12/25/21  Time Charge Nurse/RN Notified 0430  Notify: Provider  Provider Name/Title Neomia Glass, NP  Date Provider Notified 12/25/21  Time Provider Notified 0430  Notification Type Face-to-face  Notification Reason Other (Comment) (MEWS yellow, HR 120s)  Provider response See new orders  Date of Provider Response 12/25/21  Time of Provider Response 0430  Notify: Rapid Response  Name of Rapid Response RN Notified  (Not needed at this time)  Document  Patient Outcome Stabilized after interventions  Progress note created (see row info) Yes  Assess: SIRS CRITERIA  SIRS Temperature  0  SIRS Pulse 1  SIRS Respirations  0  SIRS WBC 1  SIRS Score Sum  2

## 2021-12-25 NOTE — Assessment & Plan Note (Signed)
BMI 34 

## 2021-12-25 NOTE — Assessment & Plan Note (Addendum)
Presented with tachycardia, leukocytosis, fever.  No endorgan damage.    Still fevering and tachycardic today.  Previous culture with enterococcus and with worsening symptoms, I would like to transition to Enterococcus coverage  - Follow blood and urine cultures - Stop Rocephin - Start ampicillin

## 2021-12-25 NOTE — Hospital Course (Addendum)
Emily Moon is a 61 y.o. F with IDDM, obesity, and depression who presented for decreased urine output for 3-4 days.  In the ER, Temp 103F, HR 127.  Cr 1.0, WBC 14, lactate and coags normal.  UA shoewd leukocytes.  Started on fluids and antibiotics.   2/10: Still fevering and tachycardic

## 2021-12-25 NOTE — Assessment & Plan Note (Signed)
Continue Cymbalta.

## 2021-12-25 NOTE — Assessment & Plan Note (Signed)
-   Continue trazodone and Restoril

## 2021-12-25 NOTE — Assessment & Plan Note (Addendum)
-   Patient meets sepsis criteria with increased heart rate, leukocytosis, fever, source of urine - Ceftriaxone 2 g IV ordered - Urine culture, blood cultures x2 have been ordered And are in process - Admit to telemetry medical, observation

## 2021-12-26 DIAGNOSIS — E876 Hypokalemia: Secondary | ICD-10-CM

## 2021-12-26 LAB — CBC
HCT: 30.2 % — ABNORMAL LOW (ref 36.0–46.0)
Hemoglobin: 9.8 g/dL — ABNORMAL LOW (ref 12.0–15.0)
MCH: 26.6 pg (ref 26.0–34.0)
MCHC: 32.5 g/dL (ref 30.0–36.0)
MCV: 82.1 fL (ref 80.0–100.0)
Platelets: 461 10*3/uL — ABNORMAL HIGH (ref 150–400)
RBC: 3.68 MIL/uL — ABNORMAL LOW (ref 3.87–5.11)
RDW: 17.2 % — ABNORMAL HIGH (ref 11.5–15.5)
WBC: 11.7 10*3/uL — ABNORMAL HIGH (ref 4.0–10.5)
nRBC: 0 % (ref 0.0–0.2)

## 2021-12-26 LAB — BASIC METABOLIC PANEL
Anion gap: 10 (ref 5–15)
BUN: 9 mg/dL (ref 6–20)
CO2: 25 mmol/L (ref 22–32)
Calcium: 8.4 mg/dL — ABNORMAL LOW (ref 8.9–10.3)
Chloride: 102 mmol/L (ref 98–111)
Creatinine, Ser: 0.71 mg/dL (ref 0.44–1.00)
GFR, Estimated: >60 mL/min (ref >60)
Glucose, Bld: 128 mg/dL — ABNORMAL HIGH (ref 70–99)
Potassium: 3.4 mmol/L — ABNORMAL LOW (ref 3.5–5.1)
Sodium: 137 mmol/L (ref 135–145)

## 2021-12-26 LAB — IRON AND TIBC
Iron: 15 ug/dL — ABNORMAL LOW (ref 28–170)
Saturation Ratios: 4 % — ABNORMAL LOW (ref 10.4–31.8)
TIBC: 388 ug/dL (ref 250–450)
UIBC: 373 ug/dL

## 2021-12-26 LAB — URINE CULTURE: Culture: NO GROWTH

## 2021-12-26 LAB — VITAMIN B12: Vitamin B-12: 403 pg/mL (ref 180–914)

## 2021-12-26 LAB — RETICULOCYTES
Immature Retic Fract: 19.4 % — ABNORMAL HIGH (ref 2.3–15.9)
RBC.: 3.68 MIL/uL — ABNORMAL LOW (ref 3.87–5.11)
Retic Count, Absolute: 35.7 10*3/uL (ref 19.0–186.0)
Retic Ct Pct: 1 % (ref 0.4–3.1)

## 2021-12-26 LAB — FOLATE: Folate: 11.4 ng/mL (ref >5.9)

## 2021-12-26 LAB — GLUCOSE, CAPILLARY
Glucose-Capillary: 104 mg/dL — ABNORMAL HIGH (ref 70–99)
Glucose-Capillary: 139 mg/dL — ABNORMAL HIGH (ref 70–99)
Glucose-Capillary: 109 mg/dL — ABNORMAL HIGH (ref 70–99)

## 2021-12-26 LAB — FERRITIN: Ferritin: 55 ng/mL (ref 11–307)

## 2021-12-26 MED ORDER — IBUPROFEN 400 MG PO TABS
400.0000 mg | ORAL_TABLET | Freq: Four times a day (QID) | ORAL | Status: DC | PRN
  Administered 2021-12-26 – 2021-12-27 (×3): 400 mg via ORAL
  Filled 2021-12-26 (×3): qty 1

## 2021-12-26 MED ORDER — LYSINE 500 MG PO TABS: 200.0000 mg | ORAL_TABLET | Freq: Every day | ORAL | Status: DC

## 2021-12-26 MED ORDER — SODIUM CHLORIDE 0.9 % IV BOLUS
250.0000 mL | Freq: Once | INTRAVENOUS | Status: AC
  Administered 2021-12-26: 250 mL via INTRAVENOUS

## 2021-12-26 MED ORDER — POTASSIUM CHLORIDE CRYS ER 20 MEQ PO TBCR
40.0000 meq | EXTENDED_RELEASE_TABLET | Freq: Once | ORAL | Status: AC
  Administered 2021-12-26: 40 meq via ORAL
  Filled 2021-12-26: qty 2

## 2021-12-26 MED ORDER — TEMAZEPAM 15 MG PO CAPS
30.0000 mg | ORAL_CAPSULE | Freq: Every day | ORAL | Status: DC
  Administered 2021-12-26: 30 mg via ORAL
  Filled 2021-12-26: qty 2

## 2021-12-26 MED ORDER — SODIUM CHLORIDE 0.9 % IV SOLN
2.0000 g | INTRAVENOUS | Status: DC
  Administered 2021-12-26: 2 g via INTRAVENOUS
  Filled 2021-12-26 (×2): qty 20

## 2021-12-26 MED ORDER — FERROUS SULFATE 325 (65 FE) MG PO TABS
325.0000 mg | ORAL_TABLET | Freq: Two times a day (BID) | ORAL | Status: DC
  Administered 2021-12-26 – 2021-12-27 (×2): 325 mg via ORAL
  Filled 2021-12-26 (×3): qty 1

## 2021-12-26 MED ORDER — SODIUM CHLORIDE 0.9 % IV SOLN: INTRAVENOUS | Status: DC | PRN

## 2021-12-26 MED ORDER — SODIUM CHLORIDE 0.9 % IV SOLN
1.0000 g | Freq: Four times a day (QID) | INTRAVENOUS | Status: DC
  Filled 2021-12-26 (×3): qty 1000

## 2021-12-26 NOTE — Assessment & Plan Note (Signed)
-  Continue Biotin, vitamin D, Lysine -Continue desipramine, duloxetine, Robaxin

## 2021-12-26 NOTE — Plan of Care (Signed)
Patient awake most of the night. Emily Kern, NP aware of ongoing fevers overnight. See new order. Plan of care reviewed with patient.  PLAN OF CARE ONGOING Problem: Education: Goal: Knowledge of General Education information will improve Description: Including pain rating scale, medication(s)/side effects and non-pharmacologic comfort measures Outcome: Progressing   Problem: Health Behavior/Discharge Planning: Goal: Ability to manage health-related needs will improve Outcome: Progressing   Problem: Clinical Measurements: Goal: Ability to maintain clinical measurements within normal limits will improve Outcome: Progressing Goal: Will remain free from infection Outcome: Progressing Goal: Diagnostic test results will improve Outcome: Progressing Goal: Respiratory complications will improve Outcome: Progressing Goal: Cardiovascular complication will be avoided Outcome: Progressing   Problem: Activity: Goal: Risk for activity intolerance will decrease Outcome: Progressing   Problem: Nutrition: Goal: Adequate nutrition will be maintained Outcome: Progressing   Problem: Coping: Goal: Level of anxiety will decrease Outcome: Progressing   Problem: Elimination: Goal: Will not experience complications related to bowel motility Outcome: Progressing Goal: Will not experience complications related to urinary retention Outcome: Progressing   Problem: Pain Managment: Goal: General experience of comfort will improve Outcome: Progressing   Problem: Safety: Goal: Ability to remain free from injury will improve Outcome: Progressing   Problem: Skin Integrity: Goal: Risk for impaired skin integrity will decrease Outcome: Progressing   Problem: Urinary Elimination: Goal: Signs and symptoms of infection will decrease Outcome: Progressing   Problem: Fluid Volume: Goal: Hemodynamic stability will improve Outcome: Progressing   Problem: Clinical Measurements: Goal: Diagnostic test  results will improve Outcome: Progressing   Problem: Respiratory: Goal: Ability to maintain adequate ventilation will improve Outcome: Progressing

## 2021-12-26 NOTE — Assessment & Plan Note (Signed)
-   K of 3.3 -replete with IV potassium 

## 2021-12-26 NOTE — Progress Notes (Signed)
PHARMACIST - PHYSICIAN ORDER COMMUNICATION  CONCERNING: P&T Medication Policy on Herbal Medications  DESCRIPTION:  This patients order for:  Lysine 200mg   has been noted.  This product(s) is classified as an herbal or natural product. Due to a lack of definitive safety studies or FDA approval, nonstandard manufacturing practices, plus the potential risk of unknown drug-drug interactions while on inpatient medications, the Pharmacy and Therapeutics Committee does not permit the use of herbal or natural products of this type within [facility].   ACTION TAKEN: The pharmacy department is unable to verify this order at this time.  Please reevaluate patients clinical condition at discharge and address if the herbal or natural product(s) should be resumed at that time.  Pernell Dupre, PharmD, BCPS Clinical Pharmacist 12/26/2021 10:36 AM

## 2021-12-26 NOTE — Progress Notes (Signed)
°  Progress Note   Patient: Emily Moon BLT:[id number] DOB: Feb 01, 1961 DOA: 12/24/2021     1 DOS: the patient was seen and examined on 12/26/2021       Brief hospital course: Mrs. Emily Moon is a 61 y.o. F with IDDM, obesity, and depression who presented for decreased urine output for 3-4 days.  In the ER, Temp 103F, HR 127.  Cr 1.0, WBC 14, lactate and coags normal.  UA shoewd leukocytes.  Started on fluids and antibiotics.   2/10: Still fevering and tachycardic      Assessment and Plan: * Sepsis ([place])- (present on admission) Presented with tachycardia, leukocytosis, fever.  No endorgan damage.    Still fevering and tachycardic today.  Previous culture with enterococcus and with worsening symptoms, I would like to transition to Enterococcus coverage  - Follow blood and urine cultures - Stop Rocephin - Start ampicillin  Hypokalemia - Replete K  Acute pyelonephritis- (present on admission) See above  Iron deficiency anemia Hgb stable.  No bleeding. Iron stores low - Start oral iron - Follow up with PCP for iron deficiency GI work up   Hyponatremia Na unchanged -Hold IV fluids  Essential hypertension- (present on admission) Blood pressure normal - Resume metoprolol  Type 2 diabetes mellitus without complication, without long-term current use of insulin (HCC) Glucose good - Hold metformin and Trulicity - Continue sliding scale corrections  OSA (obstructive sleep apnea)- (present on admission) Not on CPAP  Bipolar disorder ([place])- (present on admission) Well compensated - Continue Lamictal  Insomnia- (present on admission) - Continue trazodone and Restoril  Obesity- (present on admission) BMI 34  Chronic pain syndrome- (present on admission) - Continue Cymbalta  GERD- (present on admission) - Continue Pepcid and pantoprazole  Multiple sclerosis, progressive relapsing- (present on admission) -Continue Biotin, vitamin D, Lysine -Continue  desipramine, duloxetine, Robaxin        Subjective: Still fever overnight, tachycardia, dysuria, suprapubic discomfort.  No confusion.  Still flank pain.  Physical Exam: Vitals:   12/26/21 0728 12/26/21 0823 12/26/21 0923 12/26/21 1031  BP:   (!) 158/80 130/73  Pulse:   (!) 122 (!) 118  Resp:   17 17  Temp:  (!) 100.9 F (38.3 C) (!) 101.4 F (38.6 C) (!) 100.9 F (38.3 C)  TempSrc:  Oral    SpO2: 95%  96% 96%  Weight:      Height:       Adult female, lying in bed, no acute distress Heart rate tachy, no murmurs, no lower extremity edema Respiratory effort normal, lungs clear without rales or wheezes Abdomen still right sided flank and abdominal tenderness, no guarding, no rigidity or rebound Attention normal, affect appropriate, judgment and insight appear normal. Moves upper extremities with normal strength and coordination, extraocular movements intact, face symmetric      Data Reviewed: My review of labs and imaging is low iron stores, normal B12 Na still 132, K down to 3.4      Family Communication:   Disposition: Status is: Inpatient Remains inpatient appropriate because: She remains tachycardic and febrile and requires ongoing IV antibiotics              Planned Discharge Destination: Home     Author: Edwin Dada, MD 12/26/2021 10:34 AM  For on call review [email or web address].

## 2021-12-26 NOTE — Progress Notes (Signed)
Cross Cover 250 ml NS bolus for continued fever non responsive to tylenol

## 2021-12-26 NOTE — Assessment & Plan Note (Signed)
See above

## 2021-12-26 NOTE — TOC Progression Note (Signed)
Transition of Care [facility]) - Progression Note    Patient Details  Name: KALINDA ROMANIELLO MRN: [id number] Date of Birth: 31-May-1961  Transition of Care [facility]) CM/SW [place], RN Phone Number: 12/26/2021, 10:09 AM  Clinical Narrative:    Transition of Care [facility]) Screening Note   Patient Details  Name: MONQUIE FULGHAM Date of Birth: [birth year]   Transition of Care [facility]) CM/SW Contact:    Conception Oms, RN Phone Number: 12/26/2021, 10:10 AM    Transition of Care Department [facility]) has reviewed patient and no TOC needs have been identified at this time. We will continue to monitor patient advancement through interdisciplinary progression rounds. If new patient transition needs arise, please place a TOC consult.           Expected Discharge Plan and Services                                                 Social Determinants of Health (SDOH) Interventions    Readmission Risk Interventions No flowsheet data found.

## 2021-12-26 NOTE — Progress Notes (Signed)
MD Danford aware of ongoing fevers, Prn tylenol and ibuprofen administered. Ice packs provided, extra blankets removed. IV Abx changed per MD orders. Will continue to monitor and evaluate.     12/26/21 1031  Vitals  Temp (!) 100.9 F (38.3 C) (RN notified)  BP 130/73  MAP (mmHg) 87  BP Location Left Arm  BP Method Automatic  Patient Position (if appropriate) Lying  Pulse Rate (!) 118  Resp 17  MEWS COLOR  MEWS Score Color Yellow  Oxygen Therapy  SpO2 96 %  O2 Device Room Air  MEWS Score  MEWS Temp 1  MEWS Systolic 0  MEWS Pulse 2  MEWS RR 0  MEWS LOC 0  MEWS Score 3  Provider Notification  Provider Name/Title Danford MD  Date Provider Notified 12/26/21  Time Provider Notified 1031  Notification Type Face-to-face  Notification Reason Other (Comment) (ongoing fever despite intervention)  Provider response See new orders (Abx changed)  Date of Provider Response 12/26/21  Time of Provider Response 1031  Rapid Response Notification  Name of Rapid Response RN Notified  (not needed at this time)

## 2021-12-27 DIAGNOSIS — E876 Hypokalemia: Secondary | ICD-10-CM

## 2021-12-27 DIAGNOSIS — A4151 Sepsis due to Escherichia coli [E. coli]: Secondary | ICD-10-CM

## 2021-12-27 LAB — BASIC METABOLIC PANEL
Anion gap: 10 (ref 5–15)
BUN: 14 mg/dL (ref 6–20)
CO2: 26 mmol/L (ref 22–32)
Calcium: 8.4 mg/dL — ABNORMAL LOW (ref 8.9–10.3)
Chloride: 103 mmol/L (ref 98–111)
Creatinine, Ser: 0.78 mg/dL (ref 0.44–1.00)
GFR, Estimated: >60 mL/min (ref >60)
Glucose, Bld: 128 mg/dL — ABNORMAL HIGH (ref 70–99)
Potassium: 3.6 mmol/L (ref 3.5–5.1)
Sodium: 139 mmol/L (ref 135–145)

## 2021-12-27 LAB — CBC
HCT: 29.6 % — ABNORMAL LOW (ref 36.0–46.0)
Hemoglobin: 9.5 g/dL — ABNORMAL LOW (ref 12.0–15.0)
MCH: 26.5 pg (ref 26.0–34.0)
MCHC: 32.1 g/dL (ref 30.0–36.0)
MCV: 82.7 fL (ref 80.0–100.0)
Platelets: 504 10*3/uL — ABNORMAL HIGH (ref 150–400)
RBC: 3.58 MIL/uL — ABNORMAL LOW (ref 3.87–5.11)
RDW: 17.4 % — ABNORMAL HIGH (ref 11.5–15.5)
WBC: 9 10*3/uL (ref 4.0–10.5)
nRBC: 0 % (ref 0.0–0.2)

## 2021-12-27 LAB — URINE CULTURE: Culture: >=100000 — AB

## 2021-12-27 LAB — GLUCOSE, CAPILLARY: Glucose-Capillary: 108 mg/dL — ABNORMAL HIGH (ref 70–99)

## 2021-12-27 MED ORDER — FERROUS SULFATE 325 (65 FE) MG PO TABS: 325.0000 mg | ORAL_TABLET | Freq: Two times a day (BID) | ORAL | 3 refills | Status: DC

## 2021-12-27 MED ORDER — AMOXICILLIN 500 MG PO CAPS: 500.0000 mg | ORAL_CAPSULE | Freq: Three times a day (TID) | ORAL | 0 refills | Status: DC

## 2021-12-27 NOTE — Discharge Summary (Signed)
Physician Discharge Summary   Patient: Emily Moon MRN: [id number] DOB: 11/08/1961  Admit date:     12/24/2021  Discharge date: 12/27/21  Discharge Physician: Edwin Dada   PCP: [name], NP   Recommendations at discharge:  Follow up with PCP [name] in 1 week Ms Carlis Abbott: Please refer to GI for iron deficiency evaluation, if no prior colonoscopy        Discharge Diagnoses: Principal Problem:   Sepsis ([place]) Active Problems:   Multiple sclerosis, progressive relapsing   GERD   Chronic pain syndrome   Obesity   Insomnia   Bipolar disorder (HCC)   OSA (obstructive sleep apnea)   Type 2 diabetes mellitus without complication, without long-term current use of insulin (HCC)   Essential hypertension   Hyponatremia   Iron deficiency anemia   Acute pyelonephritis   Hypokalemia       Hospital Course: Emily Moon is a 61 y.o. F with IDDM, obesity, and depression who presented for decreased urine output for 3-4 days.  In the ER, Temp 103F, HR 127.  Cr 1.0, WBC 14, lactate and coags normal.  UA shoewd leukocytes.  Started on fluids and antibiotics.         Assessment and Plan: * Sepsis ([place])- (present on admission) Acute pyelonephritis- (present on admission) Presented with tachycardia, leukocytosis, fever.  No endorgan damage.  Due to E. coli pyelonephritis.  Started on IV Rocephin.  Fever curve steadily improving.  CT renal showed no hydronephrosis or abscess.  Urine culture returned E. coli, pansensitive.    Discharged to complete course with amoxicillin given intolerance to Bactrim and Levaquin.     Iron deficiency anemia Hgb stable.  No bleeding. Iron stores low - Start oral iron - Follow up with PCP for iron deficiency GI work up   Hypokalemia  Hyponatremia  Essential hypertension- (present on admission)  Type 2 diabetes mellitus without complication, without long-term current use of insulin (HCC)  OSA (obstructive  sleep apnea)- (present on admission)  Bipolar disorder ([place])- (present on admission)  Insomnia- (present on admission)  Obesity- (present on admission)  Chronic pain syndrome- (present on admission)  GERD- (present on admission)  Multiple sclerosis, progressive relapsing- (present on admission)         Pain control - [place] Controlled Substance Reporting System database was reviewed.    Consultants:  Procedures performed:   Disposition: Home Diet recommendation:  Regular diet  DISCHARGE MEDICATION: Allergies as of 12/27/2021       Reactions   Albuterol Shortness Of Breath, Other (See Comments)   Makes pt feel jittery/ tacycardic   Crestor [rosuvastatin] Other (See Comments)   Joint pain, muscle pain, and hair loss   Halcion [triazolam] Other (See Comments)   Dizziness,headaches,bladder problems   Levaquin [levofloxacin In D5w] Diarrhea, Itching   Shoulder pain   Naproxen Sodium    Patient tolerates in small doses. Her reaction is swelling of the lower extremities.    Cefaclor Other (See Comments)   Doesn't remember---unsure if actually allergic    Sulfa Antibiotics Itching   Unsure of reaction possibly itching   Tramadol Itching, Nausea And Vomiting   Zinc Other (See Comments)   constipation  constipation    Aripiprazole Other (See Comments)   Muscle tension/cramping   Diclofenac Sodium Rash   "made very sick"        Medication List     STOP taking these medications    Dimethyl Fumarate 120 & 240 MG  Misc       TAKE these medications    amoxicillin 500 MG capsule Commonly known as: AMOXIL Take 1 capsule (500 mg total) by mouth 3 (three) times daily.   Azelastine HCl 137 MCG/SPRAY Soln PLACE 2 SPRAYS INTO BOTH NOSTRILS 2 (TWO) TIMES DAILY   BIG 100 (BIOTIN) PO Take by mouth.   cetirizine 10 MG tablet Commonly known as: ZYRTEC Take 10 mg by mouth daily.   clindamycin 1 % external solution Commonly known as: CLEOCIN T Apply to  scalp once or twice a day prn bumps   clobetasol 0.05 % external solution Commonly known as: TEMOVATE Mix clobetasol solution with  CeraVe cream Use twice daily to affected areas.Avoid Face, groin and underarm   clobetasol 0.05 % topical foam Commonly known as: OLUX Apply to affected areas at arms, legs, trunk twice a day up to 2 weeks. Avoid applying to face, groin, and axilla. Use as directed. Risk of skin atrophy with long-term use reviewed.   desipramine 25 MG tablet Commonly known as: NORPRAMIN Take 1 tablet (25 mg total) by mouth daily.   diphenhydrAMINE 2 % cream Commonly known as: BENADRYL Apply topically 3 (three) times daily as needed for itching.   DULoxetine 20 MG capsule Commonly known as: CYMBALTA Take 2 capsules (40 mg total) by mouth daily.   famotidine 20 MG tablet Commonly known as: PEPCID Take 1 tablet (20 mg total) by mouth 2 (two) times daily.   fenofibrate 145 MG tablet Commonly known as: Tricor Take 1 tablet (145 mg total) by mouth daily.   ferrous sulfate 325 (65 FE) MG tablet Take 1 tablet (325 mg total) by mouth 2 (two) times daily with a meal.   fluocinonide 0.05 % external solution Commonly known as: LIDEX Apply 1 application topically 2 (two) times daily as needed. Apply to scalp twice daily as need for itch   FreeStyle Libre 14 Day Sensor Misc PLACE INTO SKIN FOR CONTINUOUS CHECK OF BLOOD SUGAR DAILY   hydrocortisone 2.5 % cream Apply 1 application topically 2 (two) times daily as needed (underarm/groin rash).   hydrOXYzine 50 MG capsule Commonly known as: VISTARIL Take 2 capsules (100 mg total) by mouth at bedtime. TAKE 1 CAPSLE BY MOUTH AT NOON, 1 AT 6 PM, AND 1 AS NEEDED   lamoTRIgine 150 MG tablet Commonly known as: LAMICTAL Take 2 tablets (300 mg total) by mouth at bedtime.   levalbuterol 45 MCG/ACT inhaler Commonly known as: XOPENEX HFA INHALE 1 TO 2 PUFFS BY MOUTH EVERY 6 HOURS AS NEEDED FOR WHEEZE   Lysine 500 MG Tabs Take  200 mg by mouth daily.   meclizine 25 MG tablet Commonly known as: ANTIVERT Take 0.5-1 tablets (12.5-25 mg total) by mouth 3 (three) times daily as needed for dizziness (sedation caution).   metFORMIN 500 MG 24 hr tablet Commonly known as: GLUCOPHAGE-XR Take 2 tablets (1,000 mg total) by mouth daily with breakfast.   methocarbamol 500 MG tablet Commonly known as: ROBAXIN TAKE 1-2 TABLETS (500-1,000 MG TOTAL) BY MOUTH AT BEDTIME AS NEEDED FOR MUSCLE SPASMS.   metoprolol tartrate 50 MG tablet Commonly known as: LOPRESSOR Take 1 tablet (50 mg total) by mouth 2 (two) times daily.   neomycin-bacitracin-polymyxin [phone] ointment Apply 1 application topically 4 (four) times daily as needed (for cut/scrapes.).   pantoprazole 40 MG tablet Commonly known as: PROTONIX Take 1 tablet (40 mg total) by mouth daily. For heartburn   polyethylene glycol powder 17 GM/SCOOP powder Commonly known as:  GLYCOLAX/MIRALAX MIX 17 GRAMS (1 CAPFUL) WITH 4-8 OZ OF LIQUID AND TAKE BY MOUTH TWICE DAILY AS NEEDED   polyvinyl alcohol 1.4 % ophthalmic solution Commonly known as: LIQUIFILM TEARS Place 2 drops into both eyes at bedtime.   Repatha SureClick 408 MG/ML Soaj Generic drug: Evolocumab Inject 1 Dose into the skin every 14 (fourteen) days.   Stiolto Respimat 2.5-2.5 MCG/ACT Aers Generic drug: Tiotropium Bromide-Olodaterol Inhale 2 puffs into the lungs daily.   temazepam 30 MG capsule Commonly known as: RESTORIL TAKE ONE TO TWO CAPSULES BY MOUTH NIGHTLY AT BEDTIME   traZODone 50 MG tablet Commonly known as: DESYREL Take 2 tablets (100 mg total) by mouth at bedtime.   Trulicity 3 XK/4.8JE Sopn Generic drug: Dulaglutide Inject 3 mg as directed once a week. For diabetes.   valACYclovir 1000 MG tablet Commonly known as: VALTREX TAKE 1 TABLET (1,000 MG TOTAL) BY MOUTH DAILY. FOR HERPES OUTBREAK PREVENTION        Follow-up Information     [name], NP. Schedule an appointment  as soon as possible for a visit in 1 week(s).   Specialty: Internal Medicine Contact information: [place] [address] [phone]                Discharge Instructions     Discharge instructions   Complete by: As directed    From Dr. Loleta Books: You were admitted with a kidney and bladder infection that caused very mild early sepsis. This was treated with antibiotics (Rocephin). We  performed a CT scan that showed no signs of abscess (pus pocket) or kidney stone or any other complication from the bladder infection. Your fevers got better and your blood cultures grew nothing here, and your urine culture grew E coli that was susceptible to all our common antibiotics.  You should finish treatment by taking amoxicillin 500 mg three times daily starting this evening.  Go see your doctor in 1 week for follow up   ALSO: Remember, you have low iron levels, You should go see your primary doctor for follow up Take iron supplements Take Ferrous sulfate 325 mg You can take this either twice daily, or if it bothers your stomach, take it every other day   Increase activity slowly   Complete by: As directed         Discharge Exam: Filed Weights   12/24/21 1448  Weight: 106.4 kg   General: Pt is alert, awake, not in acute distress Neuro/Psych: Strength symmetric in upper and lower extremities.  Judgment and insight appear normal.  Gait normal, attention normal.   Condition at discharge: good  The results of significant diagnostics from this hospitalization (including imaging, microbiology, ancillary and laboratory) are listed below for reference.   Imaging Studies: DG Chest Port 1 View  Result Date: 12/24/2021 CLINICAL DATA:  Possible sepsis. EXAM: PORTABLE CHEST 1 VIEW COMPARISON:  Chest x-ray dated June 30, 2021. FINDINGS: The heart size and mediastinal contours are within normal limits. Both lungs are clear. The visualized skeletal structures are  unremarkable. IMPRESSION: No active disease. Electronically Signed   By: Titus Dubin M.D.   On: 12/24/2021 17:44   CT Renal Stone Study  Result Date: 12/24/2021 CLINICAL DATA:  Flank pain EXAM: CT ABDOMEN AND PELVIS WITHOUT CONTRAST TECHNIQUE: Multidetector CT imaging of the abdomen and pelvis was performed following the standard protocol without IV contrast. RADIATION DOSE REDUCTION: This exam was performed according to the departmental dose-optimization program which includes automated  exposure control, adjustment of the mA and/or kV according to patient size and/or use of iterative reconstruction technique. COMPARISON:  08/31/2018 FINDINGS: Lower chest: Unremarkable. Hepatobiliary: Liver measures 21.4 cm in length. No focal abnormality is seen. Gallbladder is distended. There is no wall thickening in the gallbladder. There is no fluid around the gallbladder. There is no dilation of bile ducts. Pancreas: No focal abnormality is seen. Spleen: Spleen measures 12.6 cm in maximum diameter. Adrenals/Urinary Tract: Adrenals are not enlarged. There is no hydronephrosis. There are no renal or ureteral stones. Urinary bladder is partly obscured by severe beam hardening artifacts. Stomach/Bowel: Stomach is not distended. Small bowel loops are not dilated. Cecum is lower than usual in the position. There is a tubular structure with air in the lumen in the right pelvic cavity, possibly normal appendix. There is no significant wall thickening in colon. Scattered diverticula are seen in colon without signs of focal diverticulitis. Vascular/Lymphatic: Unremarkable. Reproductive: Evaluation is limited by severe beam hardening artifacts from bilateral hip arthroplasty. Other: There is no ascites or pneumoperitoneum. Small umbilical hernia containing fat is seen. Musculoskeletal: There is minimal anterolisthesis at L4-L5 level. Degenerative changes are noted with disc space narrowing, bony spurs and facet hypertrophy at  multiple levels. There is previous bilateral hip arthroplasty. IMPRESSION: There is no evidence of intestinal obstruction or pneumoperitoneum. There is no hydronephrosis. Appendix is not dilated. Appendix is lower than usual in the pelvic cavity. Gallbladder is distended. This may be due to fasting state or suggest cholecystitis. There are no definite imaging signs of acute cholecystitis. Please correlate with clinical symptoms and laboratory findings. Few diverticula are seen in the colon without evidence of focal acute diverticulitis. Enlarged liver and spleen.  Lumbar spondylosis. Other findings as described in the body of the report. Electronically Signed   By: Elmer Picker M.D.   On: 12/24/2021 17:47    Microbiology: Results for orders placed or performed during the hospital encounter of 12/24/21  Urine Culture     Status: None   Collection Time: 12/24/21  4:44 PM   Specimen: Urine, Clean Catch  Result Value Ref Range Status   Specimen Description   Final    URINE, CLEAN CATCH Performed at [facility], [address], [place], [place] [address]    Special Requests   Final    NONE Performed at [facility], [address] [place], [place] [address]    Culture   Final    NO GROWTH Performed at [facility], [place] [address] [place], [place] [address]    Report Status 12/26/2021 FINAL  Final  Resp Panel by RT-PCR (Flu A&B, Covid) Nasopharyngeal Swab     Status: None   Collection Time: 12/24/21  6:01 PM   Specimen: Nasopharyngeal Swab; Nasopharyngeal(NP) swabs in vial transport medium  Result Value Ref Range Status   SARS Coronavirus 2 by RT PCR NEGATIVE NEGATIVE Final    Comment: (NOTE) SARS-CoV-2 target nucleic acids are NOT DETECTED.  The SARS-CoV-2 RNA is generally detectable in upper respiratory specimens during the acute phase of infection. The lowest concentration of SARS-CoV-2 viral copies this assay can detect is 138 copies/mL. A negative  result does not preclude SARS-Cov-2 infection and should not be used as the sole basis for treatment or other patient management decisions. A negative result may occur with  improper specimen collection/handling, submission of specimen other than nasopharyngeal swab, presence of viral mutation(s) within the areas targeted by this assay, and inadequate number of  viral copies(<138 copies/mL). A negative result must be combined with clinical observations, patient history, and epidemiological information. The expected result is Negative.  Fact Sheet for Patients:  EntrepreneurPulse.com.au  Fact Sheet for Healthcare Providers:  IncredibleEmployment.be  This test is no t yet approved or cleared by the [place] FDA and  has been authorized for detection and/or diagnosis of SARS-CoV-2 by FDA under an Emergency Use Authorization (EUA). This EUA will remain  in effect (meaning this test can be used) for the duration of the COVID-19 declaration under Section 564(b)(1) of the Act, 21 U.S.C.section 360bbb-3(b)(1), unless the authorization is terminated  or revoked sooner.       Influenza A by PCR NEGATIVE NEGATIVE Final   Influenza B by PCR NEGATIVE NEGATIVE Final    Comment: (NOTE) The Xpert Xpress SARS-CoV-2/FLU/RSV plus assay is intended as an aid in the diagnosis of influenza from Nasopharyngeal swab specimens and should not be used as a sole basis for treatment. Nasal washings and aspirates are unacceptable for Xpert Xpress SARS-CoV-2/FLU/RSV testing.  Fact Sheet for Patients: EntrepreneurPulse.com.au  Fact Sheet for Healthcare Providers: IncredibleEmployment.be  This test is not yet approved or cleared by the [place] FDA and has been authorized for detection and/or diagnosis of SARS-CoV-2 by FDA under an Emergency Use Authorization (EUA). This EUA will remain in effect (meaning this test can be used)  for the duration of the COVID-19 declaration under Section 564(b)(1) of the Act, 21 U.S.C. section 360bbb-3(b)(1), unless the authorization is terminated or revoked.  Performed at [facility], [address] [place], [place] [address]   Blood Culture (routine x 2)     Status: None (Preliminary result)   Collection Time: 12/24/21  6:01 PM   Specimen: BLOOD  Result Value Ref Range Status   Specimen Description BLOOD BLOOD LEFT ARM  Final   Special Requests   Final    BOTTLES DRAWN AEROBIC AND ANAEROBIC Blood Culture results may not be optimal due to an excessive volume of blood received in culture bottles   Culture   Final    NO GROWTH 3 DAYS Performed at [facility], [address], [place], [place] [address]    Report Status PENDING  Incomplete  Blood Culture (routine x 2)     Status: None (Preliminary result)   Collection Time: 12/24/21  6:01 PM   Specimen: BLOOD  Result Value Ref Range Status   Specimen Description BLOOD BLOOD LEFT ARM  Final   Special Requests   Final    BOTTLES DRAWN AEROBIC AND ANAEROBIC Blood Culture adequate volume   Culture   Final    NO GROWTH 3 DAYS Performed at [facility], [address] [place], [place] [address]    Report Status PENDING  Incomplete  Urine Culture     Status: Abnormal   Collection Time: 12/25/21  1:30 AM   Specimen: Urine, Random  Result Value Ref Range Status   Specimen Description   Final    URINE, RANDOM Performed at [facility], [address] [place],  [address]    Special Requests   Final    NONE Performed at [facility] [address], [place],  [address]    Culture >=100,000 COLONIES/mL ESCHERICHIA COLI (A)  Final   Report Status 12/27/2021 FINAL  Final   Organism ID, Bacteria ESCHERICHIA COLI (A)  Final      Susceptibility   Escherichia coli - MIC*    AMPICILLIN <=2 SENSITIVE Sensitive  CEFAZOLIN <=4 SENSITIVE Sensitive     CEFEPIME  <=0.12 SENSITIVE Sensitive     CEFTRIAXONE <=0.25 SENSITIVE Sensitive     CIPROFLOXACIN <=0.25 SENSITIVE Sensitive     GENTAMICIN <=1 SENSITIVE Sensitive     IMIPENEM <=0.25 SENSITIVE Sensitive     NITROFURANTOIN <=16 SENSITIVE Sensitive     TRIMETH/SULFA <=20 SENSITIVE Sensitive     AMPICILLIN/SULBACTAM <=2 SENSITIVE Sensitive     PIP/TAZO <=4 SENSITIVE Sensitive     * >=100,000 COLONIES/mL ESCHERICHIA COLI   *Note: Due to a large number of results and/or encounters for the requested time period, some results have not been displayed. A complete set of results can be found in Results Review.    Labs: CBC: Recent Labs  Lab 12/24/21 1507 12/25/21 0417 12/26/21 0531 12/27/21 0508  WBC 14.3* 12.5* 11.7* 9.0  NEUTROABS  --  9.9*  --   --   HGB 11.3* 9.4* 9.8* 9.5*  HCT 36.3 29.3* 30.2* 29.6*  MCV 85.4 82.5 82.1 82.7  PLT 436* 386 461* 025*   Basic Metabolic Panel: Recent Labs  Lab 12/24/21 1507 12/24/21 1801 12/25/21 0417 12/26/21 0531 12/27/21 0508  NA 137 133* 133* 137 139  K 3.6 3.6 3.6 3.4* 3.6  CL 101 100 101 102 103  CO2 25 25 23 25 26   GLUCOSE 184* 159* 152* 128* 128*  BUN 16 15 16 9 14   CREATININE 1.25* 1.01* 1.12* 0.71 0.78  CALCIUM 8.6* 8.6* 7.9* 8.4* 8.4*   Liver Function Tests: Recent Labs  Lab 12/24/21 1801  AST 22  ALT 20  ALKPHOS 55  BILITOT 0.7  PROT 7.3  ALBUMIN 3.8   CBG: Recent Labs  Lab 12/25/21 1703 12/26/21 0745 12/26/21 1153 12/26/21 1650 12/27/21 0746  GLUCAP 109* 104* 139* 109* 108*    Discharge time spent: 25 minutes.  Signed: Edwin Dada, MD [facility] 12/27/2021

## 2021-12-27 NOTE — Progress Notes (Signed)
Patient was given verbal and written discharge instructions, she acknowledge understanding and states she will comply with medication regimen and appointments.

## 2021-12-29 ENCOUNTER — Other Ambulatory Visit (HOSPITAL_COMMUNITY): Payer: Self-pay | Admitting: Psychiatry

## 2021-12-29 ENCOUNTER — Telehealth: Payer: Self-pay

## 2021-12-29 ENCOUNTER — Other Ambulatory Visit (HOSPITAL_COMMUNITY): Payer: Self-pay

## 2021-12-29 LAB — CULTURE, BLOOD (ROUTINE X 2)
Culture: NO GROWTH
Culture: NO GROWTH
Special Requests: ADEQUATE

## 2021-12-29 MED ORDER — TEMAZEPAM 30 MG PO CAPS: ORAL_CAPSULE | ORAL | 0 refills | Status: DC

## 2021-12-29 NOTE — Telephone Encounter (Signed)
Transition Care Management Follow-up Telephone Call Date of discharge and from where: TCM DC [facility] 12-27-21 DX: Sepsis How have you been since you were released from the hospital? Feeling ok  Any questions or concerns? No  Items Reviewed: Did the pt receive and understand the discharge instructions provided? Yes  Medications obtained and verified? Yes  Other? No  Any new allergies since your discharge? No  Dietary orders reviewed? Yes Do you have support at home? Yes   Home Care and Equipment/Supplies: Were home health services ordered? no If so, what is the name of the agency? na  Has the agency set up a time to come to the patient's home? not applicable Were any new equipment or medical supplies ordered?  No What is the name of the medical supply agency?na Were you able to get the supplies/equipment? not applicable Do you have any questions related to the use of the equipment or supplies? No  Functional Questionnaire: (I = Independent and D = Dependent) ADLs: I  Bathing/Dressing- I  Meal Prep- I  Eating- I  Maintaining continence- I  Transferring/Ambulation- I  Managing Meds- I  Follow up appointments reviewed:  PCP Hospital f/u appt confirmed? Yes  [facility]? No . Are transportation arrangements needed? No  If their condition worsens, is the pt aware to call PCP or go to the Emergency Dept.? Yes Was the patient provided with contact information for the PCP's office or ED? Yes Was to pt encouraged to call back with questions or concerns? Yes

## 2021-12-30 ENCOUNTER — Telehealth: Payer: Self-pay

## 2021-12-30 NOTE — Progress Notes (Signed)
Chronic Care Management Pharmacy Assistant   Name: Emily Moon  MRN: [id number] DOB: Feb 26, 1961  Reason for Encounter: CCM (Appointment Reminder)   Medications: Outpatient Encounter Medications as of 12/30/2021  Medication Sig Note   amoxicillin (AMOXIL) 500 MG capsule Take 1 capsule (500 mg total) by mouth 3 (three) times daily.    Azelastine HCl 137 MCG/SPRAY SOLN PLACE 2 SPRAYS INTO BOTH NOSTRILS 2 (TWO) TIMES DAILY    B Complex-Biotin-FA (BIG 100, BIOTIN, PO) Take by mouth.    cetirizine (ZYRTEC) 10 MG tablet Take 10 mg by mouth daily.    clindamycin (CLEOCIN T) 1 % external solution Apply to scalp once or twice a day prn bumps    clobetasol (OLUX) 0.05 % topical foam Apply to affected areas at arms, legs, trunk twice a day up to 2 weeks. Avoid applying to face, groin, and axilla. Use as directed. Risk of skin atrophy with long-term use reviewed.    clobetasol (TEMOVATE) 0.05 % external solution Mix clobetasol solution with  CeraVe cream Use twice daily to affected areas.Avoid Face, groin and underarm    Continuous Blood Gluc Sensor (FREESTYLE LIBRE 14 DAY SENSOR) MISC PLACE INTO SKIN FOR CONTINUOUS CHECK OF BLOOD SUGAR DAILY    desipramine (NORPRAMIN) 25 MG tablet Take 1 tablet (25 mg total) by mouth daily.    diphenhydrAMINE (BENADRYL) 2 % cream Apply topically 3 (three) times daily as needed for itching.    Dulaglutide (TRULICITY) 3 VQ/0.0QQ SOPN Inject 3 mg as directed once a week. For diabetes. 12/05/2021: [facility] [place] pt assistance   DULoxetine (CYMBALTA) 20 MG capsule Take 2 capsules (40 mg total) by mouth daily.    Evolocumab (REPATHA SURECLICK) 761 MG/ML SOAJ Inject 1 Dose into the skin every 14 (fourteen) days.    famotidine (PEPCID) 20 MG tablet Take 1 tablet (20 mg total) by mouth 2 (two) times daily.    fenofibrate (TRICOR) 145 MG tablet Take 1 tablet (145 mg total) by mouth daily.    ferrous sulfate 325 (65 FE) MG tablet Take 1 tablet (325 mg total) by mouth 2  (two) times daily with a meal.    fluocinonide (LIDEX) 0.05 % external solution Apply 1 application topically 2 (two) times daily as needed. Apply to scalp twice daily as need for itch    hydrocortisone 2.5 % cream Apply 1 application topically 2 (two) times daily as needed (underarm/groin rash).    hydrOXYzine (VISTARIL) 50 MG capsule Take 2 capsules (100 mg total) by mouth at bedtime. TAKE 1 CAPSLE BY MOUTH AT NOON, 1 AT 6 PM, AND 1 AS NEEDED    lamoTRIgine (LAMICTAL) 150 MG tablet Take 2 tablets (300 mg total) by mouth at bedtime.    levalbuterol (XOPENEX HFA) 45 MCG/ACT inhaler INHALE 1 TO 2 PUFFS BY MOUTH EVERY 6 HOURS AS NEEDED FOR WHEEZE    Lysine 500 MG TABS Take 200 mg by mouth daily.    meclizine (ANTIVERT) 25 MG tablet Take 0.5-1 tablets (12.5-25 mg total) by mouth 3 (three) times daily as needed for dizziness (sedation caution).    metFORMIN (GLUCOPHAGE-XR) 500 MG 24 hr tablet Take 2 tablets (1,000 mg total) by mouth daily with breakfast. (Patient not taking: Reported on 12/29/2021)    methocarbamol (ROBAXIN) 500 MG tablet TAKE 1-2 TABLETS (500-1,000 MG TOTAL) BY MOUTH AT BEDTIME AS NEEDED FOR MUSCLE SPASMS. 12/29/2021: Takes 2 tablets every night    metoprolol tartrate (LOPRESSOR) 50 MG tablet Take 1 tablet (50 mg total)  by mouth 2 (two) times daily.    neomycin-bacitracin-polymyxin (NEOSPORIN) [phone] ointment Apply 1 application topically 4 (four) times daily as needed (for cut/scrapes.).    pantoprazole (PROTONIX) 40 MG tablet Take 1 tablet (40 mg total) by mouth daily. For heartburn 12/29/2021: Takes BID   polyethylene glycol powder (GLYCOLAX/MIRALAX) powder MIX 17 GRAMS (1 CAPFUL) WITH 4-8 OZ OF LIQUID AND TAKE BY MOUTH TWICE DAILY AS NEEDED    polyvinyl alcohol (LIQUIFILM TEARS) 1.4 % ophthalmic solution Place 2 drops into both eyes at bedtime.    temazepam (RESTORIL) 30 MG capsule TAKE ONE TO TWO CAPSULES BY MOUTH NIGHTLY AT BEDTIME    Tiotropium Bromide-Olodaterol (STIOLTO  RESPIMAT) 2.5-2.5 MCG/ACT AERS Inhale 2 puffs into the lungs daily. (Patient not taking: Reported on 12/29/2021)    traZODone (DESYREL) 50 MG tablet Take 2 tablets (100 mg total) by mouth at bedtime.    valACYclovir (VALTREX) 1000 MG tablet TAKE 1 TABLET (1,000 MG TOTAL) BY MOUTH DAILY. FOR HERPES OUTBREAK PREVENTION    No facility-administered encounter medications on file as of 12/30/2021.   [name] was contacted to remind of upcoming telephone visit with Charlene Brooke on 01/05/2022 at 2:15 pm. Patient was reminded to have all medications, supplements and any blood glucose and blood pressure readings available for review at appointment. If unable to reach, a voicemail was left for patient.   Star Rating Drugs: Medication:  Last Fill: Day Supply Metformin 500 mg 09/38/1829 90  Trulicity 3 mg  93/71/6967 28 Verified fill dates with [facility]  Charlene Brooke, CPP notified  Marijean Niemann, [place] [phone]  Time Spent: 10 Minutes

## 2021-12-31 ENCOUNTER — Ambulatory Visit (INDEPENDENT_AMBULATORY_CARE_PROVIDER_SITE_OTHER): Payer: PPO | Admitting: Primary Care

## 2021-12-31 ENCOUNTER — Other Ambulatory Visit: Payer: Self-pay

## 2021-12-31 ENCOUNTER — Encounter: Payer: Self-pay | Admitting: Primary Care

## 2021-12-31 VITALS — BP 110/78 | HR 96 | Temp 98.2°F | Ht 69.0 in | Wt 229.0 lb

## 2021-12-31 DIAGNOSIS — D72829 Elevated white blood cell count, unspecified: Secondary | ICD-10-CM | POA: Diagnosis not present

## 2021-12-31 DIAGNOSIS — E119 Type 2 diabetes mellitus without complications: Secondary | ICD-10-CM | POA: Diagnosis not present

## 2021-12-31 DIAGNOSIS — A4151 Sepsis due to Escherichia coli [E. coli]: Secondary | ICD-10-CM

## 2021-12-31 DIAGNOSIS — R7989 Other specified abnormal findings of blood chemistry: Secondary | ICD-10-CM

## 2021-12-31 DIAGNOSIS — D509 Iron deficiency anemia, unspecified: Secondary | ICD-10-CM

## 2021-12-31 LAB — CBC
HCT: 33.8 % — ABNORMAL LOW (ref 36.0–46.0)
Hemoglobin: 11.1 g/dL — ABNORMAL LOW (ref 12.0–15.0)
MCHC: 32.7 g/dL (ref 30.0–36.0)
MCV: 82.3 fl (ref 78.0–100.0)
Platelets: 814 10*3/uL — ABNORMAL HIGH (ref 150.0–400.0)
RBC: 4.11 Mil/uL (ref 3.87–5.11)
RDW: 18.8 % — ABNORMAL HIGH (ref 11.5–15.5)
WBC: 10.7 10*3/uL — ABNORMAL HIGH (ref 4.0–10.5)

## 2021-12-31 NOTE — Assessment & Plan Note (Signed)
Recent hospitalization for urosepsis. Hospital labs, imaging, notes reviewed  Today she appears stable and improved. Continue amoxicillin 500 mg 3 times daily until complete.  Repeat labs pending.

## 2021-12-31 NOTE — Assessment & Plan Note (Signed)
Newer diagnosis. Reviewed labs from hospital visit which confirm.  She has declined colonoscopies during numerous previous visits, but she does agree today.  Referral placed to GI for colonoscopy. Repeat iron studies pending.

## 2021-12-31 NOTE — Progress Notes (Signed)
Subjective:    Patient ID: Emily Moon, female    DOB: 1961/03/03, 61 y.o.   MRN: [id number]  HPI  Emily Moon is a very pleasant 61 y.o. female with a significant medical history including multiple sclerosis, type 2 diabetes, recurrent UTI, bipolar disorder, chronic pain syndrome, GERD, insomnia, OSA, who presents today for [facility] hospital follow up.  She presented to [facility] ED on 12/24/21 for a 3 day history of urinary retention, dysuria, with fevers of 103, tachycardic with heart rate in the 120s.  Negative COVID and influenza test.  UA demonstrated trace leuks, positive nitrites with other labs demonstrating leukocytosis.  She was admitted for urosepsis and further treatment.  During her hospital stay she was treated with IV fluids, IV antibiotics.  Urine culture returned back positive for Enterococcus, antibiotics were switched to ampicillin.  She was noted to be anemic, iron deficiency anemia, initiated on ferrous sulfate 325 twice daily.  She was discharged home on 12/27/2021 with recommendations for PCP follow-up in 1 week and further evaluation of iron deficiency anemia.  Today she endorses feeling much better.  She is compliant to ferrous sulfate 325 mg twice daily. She denies rectal and vaginal bleeding. She has not undergone a colonoscopy, has declined on several visits previously.  She had been experience constipation prior to her hospitalization, this has continued with oral iron so she is on stool softeners now.  She denies urinary retention, hematuria, fevers, dysuria. She does notice some tightness in the pelvis when attempting to urinate. She is compliant to her amoxicillin 500 TID, has a few days left.  She is checking glucose levels multiple times daily which is ranging 90's-130's. She started weight watchers 2 weeks ago, has found this to be helpful for weight loss thus far. She has eliminated rice and fried foods.  She is no longer on metformin.  BP Readings from  Last 3 Encounters:  12/31/21 110/78  12/27/21 130/61  11/04/21 110/78   Wt Readings from Last 3 Encounters:  12/31/21 229 lb (103.9 kg)  12/24/21 234 lb 9.1 oz (106.4 kg)  12/04/21 234 lb 11.2 oz (106.5 kg)        Review of Systems  Constitutional:  Negative for fever.  Gastrointestinal:  Positive for constipation. Negative for abdominal pain and diarrhea.  Genitourinary:  Positive for pelvic pain. Negative for difficulty urinating, dysuria, frequency and vaginal bleeding.        Past Medical History:  Diagnosis Date   Arthritis    osteo   Asthma    Bipolar disorder ([place]) 05/21/2014   Cataracts, bilateral    COPD (chronic obstructive pulmonary disease) (HCC)    DDD (degenerative disc disease), cervical    also back   Depression    Diabetes mellitus without complication (HCC)    Dizziness    Positional   Edema    feet/legs   Fibromyalgia syndrome    Fungal infection    Finger nails   GERD (gastroesophageal reflux disease)    Gout    Heart palpitations    Hip dysplasia, congenital 09/15/2013   History of kidney stones    Hypercholesterolemia    Multiple sclerosis (HCC)    weakness   Osteoporosis    osteoarthritis   Pneumonia    PONV (postoperative nausea and vomiting)    no problem after cataract surgery   Prediabetes 07/22/2018   Psoriasis    Shortness of breath dyspnea    wheezing   Sleep apnea 2012  sleep study / slight, no interventions   Urinary frequency    Weight gain 06/21/2014    Social History   Socioeconomic History   Marital status: Divorced    Spouse name: Not on file   Number of children: 1   Years of education: Not on file   Highest education level: Not on file  Occupational History   Occupation: Therapist, 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  Tobacco Use   Smoking status: Former    Packs/day: 1.00    Years: 25.00    Pack years: 25.00    Types: Cigarettes    Quit date: 2019    Years since quitting: 4.1    Smokeless tobacco: Never   Tobacco comments:    occasional use  Vaping Use   Vaping Use: Former  Substance and Sexual Activity   Alcohol use: No    Alcohol/week: 0.0 standard drinks   Drug use: Yes    Types: Methylphenidate, Marijuana    Comment: [place] 8 - once per day   Sexual activity: Never  Other Topics Concern   Not on file  Social History Narrative   Not on file   Social Determinants of Health   Financial Resource Strain: Low Risk    Difficulty of Paying Living Expenses: Not hard at all  Food Insecurity: No Food Insecurity   Worried About Charity fundraiser in the Last Year: Never true   [place] in the Last Year: Never true  Transportation Needs: No Transportation Needs   Lack of Transportation (Medical): No   Lack of Transportation (Non-Medical): No  Physical Activity: Inactive   Days of Exercise per Week: 0 days   Minutes of Exercise per Session: 0 min  Stress: No Stress Concern Present   Feeling of Stress : Not at all  Social Connections: Not on file  Intimate Partner Violence: Not At Risk   Fear of Current or Ex-Partner: No   Emotionally Abused: No   Physically Abused: No   Sexually Abused: No    Past Surgical History:  Procedure Laterality Date   CATARACT EXTRACTION W/PHACO Left 05/21/2015   Procedure: CATARACT EXTRACTION PHACO AND INTRAOCULAR LENS PLACEMENT (IOC);  Surgeon: Birder Robson, MD;  Location: [facility];  Service: Ophthalmology;  Laterality: Left;  [place] 00:35 AP% 22.9 CDE 8.11 fluid pack lot #[id number]   CATARACT EXTRACTION W/PHACO Right 06/04/2015   Procedure: CATARACT EXTRACTION PHACO AND INTRAOCULAR LENS PLACEMENT (IOC);  Surgeon: Birder Robson, MD;  Location: [facility];  Service: Ophthalmology;  Laterality: Right;  US:00:48 AP%: 10.5 CDE:5.08 Fluid lot #[id number]   CYSTOSCOPY/URETEROSCOPY/HOLMIUM LASER/STENT PLACEMENT Bilateral 09/22/2016   Procedure: CYSTOSCOPY/URETEROSCOPY/HOLMIUM LASER/STENT PLACEMENT;  Surgeon: Hollice Espy,  MD;  Location: [facility];  Service: Urology;  Laterality: Bilateral;   EYE SURGERY  2015   tissue biopsy   FOOT SURGERY  2015   JOINT REPLACEMENT Left 2013   hip replacement   LITHOTRIPSY     PTOSIS REPAIR Bilateral 02/18/2016   Procedure: BILATERAL PTOSIS REPAIR UPPER EYELIDS;  Surgeon: Karle Starch, MD;  Location: [place];  Service: Ophthalmology;  Laterality: Bilateral;  LEAVE PT EARLY AM   thumb surgery Right    TONSILLECTOMY  1973   TOTAL HIP ARTHROPLASTY Right 07/02/2021   Procedure: TOTAL HIP ARTHROPLASTY ANTERIOR APPROACH;  Surgeon: Gaynelle Arabian, MD;  Location: [facility];  Service: Orthopedics;  Laterality: Right;    Family History  Problem Relation Age of Onset   Cancer Father  Abdomen with mastasis   Cancer Mother    Heart disease Mother    Kidney disease Neg Hx    Bladder Cancer Neg Hx    Prostate cancer Neg Hx    Kidney cancer Neg Hx     Allergies  Allergen Reactions   Albuterol Shortness Of Breath and Other (See Comments)    Makes pt feel jittery/ tacycardic   Crestor [Rosuvastatin] Other (See Comments)    Joint pain, muscle pain, and hair loss   Halcion [Triazolam] Other (See Comments)    Dizziness,headaches,bladder problems   Levaquin [Levofloxacin In D5w] Diarrhea and Itching    Shoulder pain   Naproxen Sodium     Patient tolerates in small doses. Her reaction is swelling of the lower extremities.    Cefaclor Other (See Comments)    Doesn't remember---unsure if actually allergic    Sulfa Antibiotics Itching    Unsure of reaction possibly itching   Tramadol Itching and Nausea And Vomiting   Zinc Other (See Comments)    constipation  constipation    Aripiprazole Other (See Comments)    Muscle tension/cramping   Diclofenac Sodium Rash    "made very sick"    Current Outpatient Medications on File Prior to Visit  Medication Sig Dispense Refill   amoxicillin (AMOXIL) 500 MG capsule Take 1 capsule (500 mg total) by mouth 3 (three) times  daily. 21 capsule 0   Azelastine HCl 137 MCG/SPRAY SOLN PLACE 2 SPRAYS INTO BOTH NOSTRILS 2 (TWO) TIMES DAILY 30 mL 5   B Complex-Biotin-FA (BIG 100, BIOTIN, PO) Take by mouth.     cetirizine (ZYRTEC) 10 MG tablet Take 10 mg by mouth daily.     clindamycin (CLEOCIN T) 1 % external solution Apply to scalp once or twice a day prn bumps 30 mL 2   clobetasol (OLUX) 0.05 % topical foam Apply to affected areas at arms, legs, trunk twice a day up to 2 weeks. Avoid applying to face, groin, and axilla. Use as directed. Risk of skin atrophy with long-term use reviewed. 50 g 0   clobetasol (TEMOVATE) 0.05 % external solution Mix clobetasol solution with  CeraVe cream Use twice daily to affected areas.Avoid Face, groin and underarm 50 mL 0   Continuous Blood Gluc Sensor (FREESTYLE LIBRE 14 DAY SENSOR) MISC PLACE INTO SKIN FOR CONTINUOUS CHECK OF BLOOD SUGAR DAILY 6 each 2   desipramine (NORPRAMIN) 25 MG tablet Take 1 tablet (25 mg total) by mouth daily. 30 tablet 6   diphenhydrAMINE (BENADRYL) 2 % cream Apply topically 3 (three) times daily as needed for itching.     Dulaglutide (TRULICITY) 3 WN/4.6EV SOPN Inject 3 mg as directed once a week. For diabetes. 2 mL 0   DULoxetine (CYMBALTA) 20 MG capsule Take 2 capsules (40 mg total) by mouth daily. 180 capsule 2   Evolocumab (REPATHA SURECLICK) 035 MG/ML SOAJ Inject 1 Dose into the skin every 14 (fourteen) days. 2 mL 3   famotidine (PEPCID) 20 MG tablet Take 1 tablet (20 mg total) by mouth 2 (two) times daily. 180 tablet 3   fenofibrate (TRICOR) 145 MG tablet Take 1 tablet (145 mg total) by mouth daily. 30 tablet 2   ferrous sulfate 325 (65 FE) MG tablet Take 1 tablet (325 mg total) by mouth 2 (two) times daily with a meal. 60 tablet 3   fluocinonide (LIDEX) 0.05 % external solution Apply 1 application topically 2 (two) times daily as needed. Apply to scalp twice daily as need  for itch 180 mL 3   hydrocortisone 2.5 % cream Apply 1 application topically 2 (two)  times daily as needed (underarm/groin rash).     hydrOXYzine (VISTARIL) 50 MG capsule Take 2 capsules (100 mg total) by mouth at bedtime. TAKE 1 CAPSLE BY MOUTH AT NOON, 1 AT 6 PM, AND 1 AS NEEDED 90 capsule 3   lamoTRIgine (LAMICTAL) 150 MG tablet Take 2 tablets (300 mg total) by mouth at bedtime. 180 tablet 1   levalbuterol (XOPENEX HFA) 45 MCG/ACT inhaler INHALE 1 TO 2 PUFFS BY MOUTH EVERY 6 HOURS AS NEEDED FOR WHEEZE 1 each 0   Lysine 500 MG TABS Take 200 mg by mouth daily.     meclizine (ANTIVERT) 25 MG tablet Take 0.5-1 tablets (12.5-25 mg total) by mouth 3 (three) times daily as needed for dizziness (sedation caution). 30 tablet 0   methocarbamol (ROBAXIN) 500 MG tablet TAKE 1-2 TABLETS (500-1,000 MG TOTAL) BY MOUTH AT BEDTIME AS NEEDED FOR MUSCLE SPASMS. 180 tablet 0   neomycin-bacitracin-polymyxin (NEOSPORIN) [phone] ointment Apply 1 application topically 4 (four) times daily as needed (for cut/scrapes.).     pantoprazole (PROTONIX) 40 MG tablet Take 1 tablet (40 mg total) by mouth daily. For heartburn 90 tablet 3   polyethylene glycol powder (GLYCOLAX/MIRALAX) powder MIX 17 GRAMS (1 CAPFUL) WITH 4-8 OZ OF LIQUID AND TAKE BY MOUTH TWICE DAILY AS NEEDED 527 g 0   polyvinyl alcohol (LIQUIFILM TEARS) 1.4 % ophthalmic solution Place 2 drops into both eyes at bedtime.     temazepam (RESTORIL) 30 MG capsule TAKE ONE TO TWO CAPSULES BY MOUTH NIGHTLY AT BEDTIME 60 capsule 0   Tiotropium Bromide-Olodaterol (STIOLTO RESPIMAT) 2.5-2.5 MCG/ACT AERS Inhale 2 puffs into the lungs daily. 1 each 5   traZODone (DESYREL) 50 MG tablet Take 2 tablets (100 mg total) by mouth at bedtime. 60 tablet 7   valACYclovir (VALTREX) 1000 MG tablet TAKE 1 TABLET (1,000 MG TOTAL) BY MOUTH DAILY. FOR HERPES OUTBREAK PREVENTION 90 tablet 0   metFORMIN (GLUCOPHAGE-XR) 500 MG 24 hr tablet Take 2 tablets (1,000 mg total) by mouth daily with breakfast. (Patient not taking: Reported on 12/31/2021) 60 tablet 11   metoprolol  tartrate (LOPRESSOR) 50 MG tablet Take 1 tablet (50 mg total) by mouth 2 (two) times daily. 180 tablet 3   No current facility-administered medications on file prior to visit.    BP 110/78    Pulse 96    Temp 98.2 F (36.8 C) (Temporal)    Ht 5\' 9"  (1.753 m)    Wt 229 lb (103.9 kg)    LMP 08/23/2014    SpO2 96%    BMI 33.82 kg/m  Objective:   Physical Exam Cardiovascular:     Rate and Rhythm: Normal rate and regular rhythm.  Pulmonary:     Effort: Pulmonary effort is normal.     Breath sounds: Normal breath sounds.  Abdominal:     General: Bowel sounds are normal.     Palpations: Abdomen is soft.     Tenderness: There is no abdominal tenderness.  Musculoskeletal:     Cervical back: Neck supple.  Skin:    General: Skin is warm and dry.  Psychiatric:        Mood and Affect: Mood normal.          Assessment & Plan:      This visit occurred during the SARS-CoV-2 public health emergency.  Safety protocols were in place, including screening questions prior to the  visit, additional usage of staff PPE, and extensive cleaning of exam room while observing appropriate contact time as indicated for disinfecting solutions.

## 2021-12-31 NOTE — Patient Instructions (Addendum)
Stop by the lab prior to leaving today. I will notify you of your results once received.   You will be contacted regarding your referral to GI.  Please let us know if you have not been contacted within two weeks.   Set up an office visit for 2 months.   It was a pleasure to see you today!

## 2021-12-31 NOTE — Assessment & Plan Note (Signed)
Controlled, reviewed A1c from December 2022. Glucose readings that she brings today appear stable  Continue Trulicity 3 mg weekly.  Remain off metformin as this caused a rash.  We will plan to see her back in 2 months for diabetes follow-up.

## 2022-01-01 ENCOUNTER — Other Ambulatory Visit (INDEPENDENT_AMBULATORY_CARE_PROVIDER_SITE_OTHER): Payer: PPO

## 2022-01-01 DIAGNOSIS — D72829 Elevated white blood cell count, unspecified: Secondary | ICD-10-CM | POA: Diagnosis not present

## 2022-01-01 DIAGNOSIS — R7989 Other specified abnormal findings of blood chemistry: Secondary | ICD-10-CM | POA: Diagnosis not present

## 2022-01-01 LAB — IBC + FERRITIN
Ferritin: 32.7 ng/mL (ref 10.0–291.0)
Iron: 57 ug/dL (ref 42–145)
Saturation Ratios: 11.8 % — ABNORMAL LOW (ref 20.0–50.0)
TIBC: 481.6 ug/dL — ABNORMAL HIGH (ref 250.0–450.0)
Transferrin: 344 mg/dL (ref 212.0–360.0)

## 2022-01-01 LAB — BASIC METABOLIC PANEL
BUN: 16 mg/dL (ref 6–23)
CO2: 28 mEq/L (ref 19–32)
Calcium: 9 mg/dL (ref 8.4–10.5)
Chloride: 103 mEq/L (ref 96–112)
Creatinine, Ser: 1.01 mg/dL (ref 0.40–1.20)
GFR: 60.84 mL/min (ref >60.00)
Glucose, Bld: 116 mg/dL — ABNORMAL HIGH (ref 70–99)
Potassium: 4.4 mEq/L (ref 3.5–5.1)
Sodium: 140 mEq/L (ref 135–145)

## 2022-01-01 LAB — WHITE CELL DIFFERENTIAL
Band Neutrophils: 0 %
Basophils Relative: 1.3 % (ref 0.0–3.0)
Eosinophils Relative: 3 % (ref 0.0–5.0)
Lymphocytes Relative: 17.1 % (ref 12.0–46.0)
Monocytes Relative: 4.7 % (ref 3.0–12.0)
Neutrophils Relative %: 73.9 % (ref 43.0–77.0)

## 2022-01-01 NOTE — Addendum Note (Signed)
Addended by: Ellamae Sia on: 01/01/2022 02:34 PM   Modules accepted: Orders

## 2022-01-02 LAB — PATHOLOGIST SMEAR REVIEW

## 2022-01-03 DIAGNOSIS — D509 Iron deficiency anemia, unspecified: Secondary | ICD-10-CM

## 2022-01-03 DIAGNOSIS — R7989 Other specified abnormal findings of blood chemistry: Secondary | ICD-10-CM

## 2022-01-05 ENCOUNTER — Other Ambulatory Visit: Payer: Self-pay

## 2022-01-05 ENCOUNTER — Ambulatory Visit (INDEPENDENT_AMBULATORY_CARE_PROVIDER_SITE_OTHER): Payer: PPO | Admitting: Pharmacist

## 2022-01-05 DIAGNOSIS — E119 Type 2 diabetes mellitus without complications: Secondary | ICD-10-CM

## 2022-01-05 DIAGNOSIS — G35 Multiple sclerosis: Secondary | ICD-10-CM

## 2022-01-05 DIAGNOSIS — J449 Chronic obstructive pulmonary disease, unspecified: Secondary | ICD-10-CM

## 2022-01-05 DIAGNOSIS — E785 Hyperlipidemia, unspecified: Secondary | ICD-10-CM

## 2022-01-05 DIAGNOSIS — J4489 Other specified chronic obstructive pulmonary disease: Secondary | ICD-10-CM

## 2022-01-05 DIAGNOSIS — F3170 Bipolar disorder, currently in remission, most recent episode unspecified: Secondary | ICD-10-CM

## 2022-01-05 DIAGNOSIS — D509 Iron deficiency anemia, unspecified: Secondary | ICD-10-CM

## 2022-01-05 NOTE — Progress Notes (Signed)
Chronic Care Management Pharmacy Note  01/06/2022 Name:  Emily Moon MRN:  [id number] DOB:  01-Jan-1961  Summary: CCM F/U visit -Pt did not start Stiolto due to cost (non-formulary product); she reports while she was in the hospital they gave her Incruse and she tolerated this well; unfortunately this is also non-preferred with her insurance, but Spiriva and Anoro are preferred -Pt has not started MS medication dimethyl fumarate due to issues at the pharmacy. I completed PA through covermymeds and medication was approved  Recommendations/Changes made from today's visit: -Recommend Spiriva Respimat 2.5 mct/act 1 puff daily - consulting with pulmonary -Recommend to start Dimethyl fumrate as prescribed per neurology;   Plan: -[facility] Health Concierge will [facility] to ensure dispensing of dimethyl fumarate -Pharmacist follow up televisit scheduled for 3 months -PCP follow up due mid-April 2023 (not yet scheduled)    Subjective: Emily Moon is an 61 y.o. year old female who is a primary patient of Doreene Nest, NP.  The CCM team was consulted for assistance with disease management and care coordination needs.    Engaged with patient by telephone for follow up visit in response to provider referral for pharmacy case management and/or care coordination services.   Consent to Services:  The patient was given information about Chronic Care Management services, agreed to services, and gave verbal consent prior to initiation of services.  Please see initial visit note for detailed documentation.   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 She only leaves  home for grocery trips and doctor appts.  Recent office visits: 12/31/21 NP [name] OV: hospital f/u; workup IDA, referred for colonoscopy. D/C metformin (rash); continue Trulicity. Referred to hematology for anemia evaluation.  11/04/21 Dr Alphonsus Sias OV: f/u DM. Pt mixed up Trulicity and Repatha. Concerned about high BG later in day - add metformin 500 mg daily. Can increase to 2 tablets as tolerated.  09/19/21 Dr Alphonsus Sias OV: UTI - rx'd Macrobid  09/02/2021 - [name], NP - Video Visit - Patient presented for facial swelling. Labs: A1c. Increase Trulicity to 3 mg/week.  06/04/2021 - [name], NP - Patient presented for annual exam. Labs:A1c, Cytology - PAP. Change: cyanocobalamin (,VITAMIN B-12,) 1000 MCG/ML injection, famotidine (PEPCID) 20 MG tablet and pantoprazole (PROTONIX) 40 MG tablet.  06/03/2021 - Janalyn Shy, LPN - Patient presented for Annual Wellness Visit.   05/08/2021 - Hannah Beat, MD - Patient presented for psoriatic arthritis. Referral to Rheumatology   04/23/2021 - [name], NP - Patient presented for Pre-op clearance. Start: valACYclovir (VALTREX) 1000 MG tablet.  Recent consult visits: 12/04/21 NP Tammy Parrett (pulmonary [facility]): f/u COPD. Start Stiolto 2 puffs daily. Use Xoponex PRN.  12/03/21 Dr Donell Beers [facility] (psychiatry): f/u depression. Short visit, no med change and RTC 2 months for in-person visit.  11/18/21 Dr Neale Burly (dermatology): f/u eczema and folliculitis. Start clindamycin solution for scalp.  10/27/21 Dr Azucena Cecil (cardiology): f/u tachycardia, improved with lopressor. No med changes.  10/21/21 Dr Neale Burly (dermatology): f/u eczema flare. Restart clobetasol foam.  10/14/21 Dr Malvin Johns [facility] Neurology): initial consult for MS. Start Tecfidera 120 mg BID x 2 weeks then increase to 240 mg BID. F/U 2 months. 08/22/21 PA Fransico Michael (Cardiology): f/u tachycardia. D/c  toprol and Start metoprolol tartrate 50 mg BID. Ordered 2-wk heart  monitor 08/08/2021 - Dr Garen Lah, Cardiology - Patient presented for follow up inappropriate sinus tachycardia. Labs: Increase metoprolol succinate to 75 mg daily. Returned to 50 mg dose shortly after due to worsening sx. 07/30/2021 - Behavioral Health - Video Visit for Mixed Bipolar, depression, insomnia. No med changes. 06/27/2021 - NP Parrett, Pulmonary - Patient presented for COPD with Asthma. Labs: DG Chest 2 views No medication changes. 06/26/2021 - Cardiology - Patient presented for follow up inappropriate sinus tachycardia. Labs: Lipid and EKG. No medication changes. 06/19/2021 - Dr Jefm Bryant, Rheumatology - referred to rule out psoriatic arthritis. No definite dx of psoriasis.  Hospital visits: Medication Reconciliation was completed by comparing discharge summary, patients EMR and Pharmacy list, and upon discussion with patient.  Admitted to the hospital on 12/24/21 due to Pyelonephritis/sepsis. Discharge date was 12/27/21. Discharged from [facility] UTI. Follow up PCP for iron deficiency workup  New?Medications Started at [facility] Discharge:?? -started Amoxicillin due to Urosepsis -Oral iron  Medications that remain the same after Hospital Discharge:??  -All other medications will remain the same.    Objective:  Lab Results  Component Value Date   CREATININE 1.01 12/31/2021   BUN 16 12/31/2021   GFR 60.84 12/31/2021   GFRNONAA >60 12/27/2021   GFRAA >60 02/19/2020   NA 140 12/31/2021   K 4.4 12/31/2021   CALCIUM 9.0 12/31/2021   CO2 28 12/31/2021   GLUCOSE 116 (H) 12/31/2021    Lab Results  Component Value Date/Time   HGBA1C 6.7 (A) 11/04/2021 10:20 AM   HGBA1C 6.6 (H) 07/02/2021 01:08 PM   HGBA1C 6.3 (A) 06/04/2021 02:45 PM   HGBA1C 6.9 (H) 03/10/2021 04:28 PM   GFR 60.84 12/31/2021 01:38 PM   GFR 75.32 08/28/2020 12:33 PM   MICROALBUR 1.0 03/10/2021 04:28 PM    Last diabetic Eye exam:  Lab Results  Component Value Date/Time   HMDIABEYEEXA No  Retinopathy 05/14/2021 12:00 AM    Last diabetic Foot exam: No results found for: HMDIABFOOTEX   Lab Results  Component Value Date   CHOL 159 03/10/2021   HDL 39.90 03/10/2021   LDLCALC 81 03/10/2021   LDLDIRECT 53.0 08/01/2020   TRIG 189.0 (H) 03/10/2021   CHOLHDL 4 03/10/2021    Hepatic Function Latest Ref Rng & Units 12/24/2021 06/23/2021 02/19/2020  Total Protein 6.5 - 8.1 g/dL 7.3 7.7 7.6  Albumin 3.5 - 5.0 g/dL 3.8 4.8 4.5  AST 15 - 41 U/L $Remo'22 24 20  'Fezga$ ALT 0 - 44 U/L 20 44 19  Alk Phosphatase 38 - 126 U/L 55 44 75  Total Bilirubin 0.3 - 1.2 mg/dL 0.7 0.5 0.8  Bilirubin, Direct 0.0 - 0.3 mg/dL - - -    Lab Results  Component Value Date/Time   TSH 0.902 02/19/2020 03:53 PM   TSH 1.31 05/10/2019 02:34 PM   TSH 4.32 11/24/2018 08:26 AM   FREET4 1.08 02/19/2020 03:53 PM   FREET4 0.82 07/25/2013 12:16 PM    CBC Latest Ref Rng & Units 12/31/2021 12/27/2021 12/26/2021  WBC 4.0 - 10.5 K/uL 10.7(H) 9.0 11.7(H)  Hemoglobin 12.0 - 15.0 g/dL 11.1(L) 9.5(L) 9.8(L)  Hematocrit 36.0 - 46.0 % 33.8(L) 29.6(L) 30.2(L)  Platelets 150.0 - 400.0 K/uL 814.0(H) 504(H) 461(H)    Lab Results  Component Value Date/Time   VD25OH 75.66 01/22/2020 02:32 PM   VD25OH 61.68 08/15/2019 11:13 AM    Clinical ASCVD: No  The 10-year ASCVD  risk score (Arnett DK, et al., 2019) is: 4.4%   Values used to calculate the score:     Age: 69 years     Sex: Female     Is Non-Hispanic African American: No     Diabetic: Yes     Tobacco smoker: No     Systolic Blood Pressure: 782 mmHg     Is BP treated: No     HDL Cholesterol: 39.9 mg/dL     Total Cholesterol: 159 mg/dL    Depression screen [facility] 2/9 06/03/2021 04/23/2021 04/05/2020  Decreased Interest 3 3 3   Down, Depressed, Hopeless 3 3 3   PHQ - 2 Score 6 6 6   Altered sleeping 3 3 0  Tired, decreased energy 3 3 0  Change in appetite 0 0 0  Feeling bad or failure about yourself  0 3 0  Trouble concentrating 1 0 0  Moving slowly or fidgety/restless 1 0 0   Suicidal thoughts 1 0 0  PHQ-9 Score 15 15 6   Difficult doing work/chores Somewhat difficult Very difficult Somewhat difficult  Some recent data might be hidden    No flowsheet data found.   Social History   Tobacco Use  Smoking Status Former   Packs/day: 1.00   Years: 25.00   Pack years: 25.00   Types: Cigarettes   Quit date: 2019   Years since quitting: 4.1  Smokeless Tobacco Never  Tobacco Comments   occasional use   BP Readings from Last 3 Encounters:  12/31/21 110/78  12/27/21 130/61  11/04/21 110/78   Pulse Readings from Last 3 Encounters:  12/31/21 96  12/27/21 (!) 102  11/04/21 92   Wt Readings from Last 3 Encounters:  12/31/21 229 lb (103.9 kg)  12/24/21 234 lb 9.1 oz (106.4 kg)  12/04/21 234 lb 11.2 oz (106.5 kg)   BMI Readings from Last 3 Encounters:  12/31/21 33.82 kg/m  12/24/21 34.64 kg/m  12/04/21 34.66 kg/m    Assessment/Interventions: Review of patient past medical history, allergies, medications, health status, including review of consultants reports, laboratory and other test data, was performed as part of comprehensive evaluation and provision of chronic care management services.   SDOH:  (Social Determinants of Health) assessments and interventions performed: Yes  SDOH Screenings   Alcohol Screen: Low Risk    Last Alcohol Screening Score (AUDIT): 0  Depression (PHQ2-9): Medium Risk   PHQ-2 Score: 15  Financial Resource Strain: Low Risk    Difficulty of Paying Living Expenses: Not hard at all  Food Insecurity: No Food Insecurity   Worried About Charity fundraiser in the Last Year: Never true   Ran Out of Food in the Last Year: Never true  Housing: Low Risk    Last Housing Risk Score: 0  Physical Activity: Inactive   Days of Exercise per Week: 0 days   Minutes of Exercise per Session: 0 min  Social Connections: Not on file  Stress: No Stress Concern Present   Feeling of Stress : Not at all  Tobacco Use: Medium Risk   Smoking  Tobacco Use: Former   Smokeless Tobacco Use: Never   Passive Exposure: Not on file  Transportation Needs: No Transportation Needs   Lack of Transportation (Medical): No   Lack of Transportation (Non-Medical): No    CCM Care Plan  Allergies  Allergen Reactions   Albuterol Shortness Of Breath and Other (See Comments)    Makes pt feel jittery/ tacycardic   Crestor [Rosuvastatin] Other (See Comments)  Joint pain, muscle pain, and hair loss   Halcion [Triazolam] Other (See Comments)    Dizziness,headaches,bladder problems   Levaquin [Levofloxacin In D5w] Diarrhea and Itching    Shoulder pain   Metformin And Related Rash   Naproxen Sodium     Patient tolerates in small doses. Her reaction is swelling of the lower extremities.    Cefaclor Other (See Comments)    Doesn't remember---unsure if actually allergic    Sulfa Antibiotics Itching    Unsure of reaction possibly itching   Tramadol Itching and Nausea And Vomiting   Zinc Other (See Comments)    constipation  constipation    Aripiprazole Other (See Comments)    Muscle tension/cramping   Diclofenac Sodium Rash    "made very sick"    Medications Reviewed Today     Reviewed by [name], [facility] (Pharmacist) on 01/06/22 at 1037  Med List Status: <None>   Medication Order Taking? Sig Documenting Provider Last Dose Status Informant  Azelastine HCl 137 MCG/SPRAY SOLN [id number] Yes PLACE 2 SPRAYS INTO BOTH NOSTRILS 2 (TWO) TIMES DAILY [name], NP Taking Active Self  B Complex-Biotin-FA (BIG 100, BIOTIN, PO) [id number] Yes Take by mouth. [provider] Taking Active Self  cetirizine (ZYRTEC) 10 MG tablet [id number] Yes Take 10 mg by mouth daily. [provider] Taking Active Self  clindamycin (CLEOCIN T) 1 % external solution [id number] Yes Apply to scalp once or twice a day prn bumps Moye, [place], MD Taking Active Self  clobetasol (OLUX) 0.05 % topical foam [id number] Yes Apply to affected areas  at arms, legs, trunk twice a day up to 2 weeks. Avoid applying to face, groin, and axilla. Use as directed. Risk of skin atrophy with long-term use reviewed. Moye, [place], MD Taking Active Self  clobetasol (TEMOVATE) 0.05 % external solution [id number] Yes Mix clobetasol solution with  CeraVe cream Use twice daily to affected areas.Avoid Face, groin and underarm Moye, [place], MD Taking Active Self  Continuous Blood Gluc Sensor (FREESTYLE LIBRE 14 DAY SENSOR) [place] [id number] Yes PLACE INTO SKIN FOR CONTINUOUS CHECK OF BLOOD SUGAR DAILY [name], NP Taking Active Self  desipramine (NORPRAMIN) 25 MG tablet [id number] Yes Take 1 tablet (25 mg total) by mouth daily. Norma Fredrickson, MD Taking Active Self  Dimethyl Fumarate 120 & 240 [place] [id number] Yes Take by mouth. Take 120 mg twice daily x 7 days then increase to 240 mg twice daily [provider]  Active            Med Note [name]   Tue Jan 06, 2022 10:27 AM) PA approved 01/06/22  diphenhydrAMINE (BENADRYL) 2 % cream [id number] Yes Apply topically 3 (three) times daily as needed for itching. [provider] Taking Active Self  Dulaglutide (TRULICITY) 3 VE/9.3YB SOPN [id number] Yes Inject 3 mg as directed once a week. For diabetes. [name], NP Taking Active Self           Med Note [name]   Fri Dec 05, 2021  2:07 PM) Via [facility] Cares pt assistance  DULoxetine (CYMBALTA) 20 MG capsule [id number] Yes Take 2 capsules (40 mg total) by mouth daily. Norma Fredrickson, MD Taking Active Self  Evolocumab [place] SURECLICK) 536 MG/ML Darden Palmer [id number] Yes Inject 1 Dose into the skin every 14 (fourteen) days. [name] Sable, MD Taking Active Self  famotidine (PEPCID) 20 MG tablet [id number] Yes Take 1 tablet (20 mg total) by mouth 2 (two) times daily. Carlis Abbott,  Leticia Penna, NP Taking Active Self  fenofibrate (TRICOR) 145 MG tablet [id number] Yes Take 1 tablet (145 mg total) by mouth daily. [name] Sable, MD Taking Active Self  ferrous sulfate 325 (65 FE) MG tablet [id number] Yes Take 1 tablet (325 mg total) by mouth 2 (two) times daily with a meal. Danford, Suann Larry, MD Taking Active   fluocinonide (LIDEX) 0.05 % external solution [id number] Yes Apply 1 application topically 2 (two) times daily as needed. Apply to scalp twice daily as need for itch Moye, [place], MD Taking Active Self  hydrocortisone 2.5 % cream [id number] Yes Apply 1 application topically 2 (two) times daily as needed (underarm/groin rash). [provider] Taking Active Self  hydrOXYzine (VISTARIL) 50 MG capsule [id number] Yes Take 2 capsules (100 mg total) by mouth at bedtime. TAKE 1 CAPSLE BY MOUTH AT NOON, 1 AT 6 PM, AND 1 AS NEEDED Plovsky, Gerald, MD Taking Active Self  lamoTRIgine (LAMICTAL) 150 MG tablet [id number] Yes Take 2 tablets (300 mg total) by mouth at bedtime. Norma Fredrickson, MD Taking Active Self  levalbuterol [facility] HFA) 45 MCG/ACT inhaler [id number] Yes INHALE 1 TO 2 PUFFS BY MOUTH EVERY 6 HOURS AS NEEDED FOR WHEEZE [name], NP Taking Active Self  Lysine 500 MG TABS [id number] Yes Take 200 mg by mouth daily. [provider] Taking Active Self  meclizine (ANTIVERT) 25 MG tablet [id number] Yes Take 0.5-1 tablets (12.5-25 mg total) by mouth 3 (three) times daily as needed for dizziness (sedation caution). Tonia Ghent, MD Taking Active Self  methocarbamol (ROBAXIN) 500 MG tablet [id number] Yes TAKE 1-2 TABLETS (500-1,000 MG TOTAL) BY MOUTH AT BEDTIME AS NEEDED FOR MUSCLE SPASMS. [name], NP Taking Active Self           Med Note Patrick North, REBECCA H   Mon Dec 29, 2021  1:08 PM) Takes 2 tablets every night   metoprolol tartrate (LOPRESSOR) 50 MG tablet [id number]  Take 1 tablet (50 mg total) by mouth 2 (two) times daily. Furth, Cadence H, PA-C  Expired 12/29/21 2359   neomycin-bacitracin-polymyxin (NEOSPORIN) [phone] ointment [id number] Yes Apply 1 application topically  4 (four) times daily as needed (for cut/scrapes.). [provider] Taking Active Self           Med Note Earna Coder Mar 10, 2021  3:56 PM)    pantoprazole (PROTONIX) 40 MG tablet [id number] Yes Take 1 tablet (40 mg total) by mouth daily. For heartburn [name], NP Taking Active Self           Med Note Patrick North, REBECCA H   Mon Dec 29, 2021  1:09 PM) Takes BID  polyethylene glycol powder (GLYCOLAX/MIRALAX) powder [id number] Yes MIX 17 GRAMS (1 CAPFUL) WITH 4-8 OZ OF LIQUID AND TAKE BY MOUTH TWICE DAILY AS NEEDED [name], NP Taking Active Self  polyvinyl alcohol (LIQUIFILM TEARS) 1.4 % ophthalmic solution [id number] Yes Place 2 drops into both eyes at bedtime. [provider] Taking Active Self  temazepam (RESTORIL) 30 MG capsule [id number] Yes TAKE ONE TO TWO CAPSULES BY MOUTH NIGHTLY AT BEDTIME Norma Fredrickson, MD Taking Active   Patient not taking:  Discontinued 01/06/22 1036 (Cost of medication) traZODone (DESYREL) 50 MG tablet [id number] Yes Take 2 tablets (100 mg total) by mouth at bedtime. Norma Fredrickson, MD Taking Active Self  valACYclovir (VALTREX) 1000 MG tablet [id number] Yes TAKE 1 TABLET (1,000 MG TOTAL) BY MOUTH DAILY. FOR HERPES OUTBREAK PREVENTION [name],  NP Taking Active Self            Patient Active Problem List   Diagnosis Date Noted   Hypokalemia 12/26/2021   Essential hypertension 12/25/2021   Hyponatremia 12/25/2021   Iron deficiency anemia 12/25/2021   Acute pyelonephritis 12/25/2021   Sepsis ([place]) 12/24/2021   Myalgia due to statin 11/04/2021   Swelling of lymph node 09/02/2021   Primary osteoarthritis of right hip 07/02/2021   Preop pulmonary/respiratory exam 06/27/2021   Imbalance 12/10/2020   Type 2 diabetes mellitus without complication, without long-term current use of insulin ([place]) 08/28/2020   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   Psoriasis 11/02/2019   Rash and nonspecific skin  eruption 02/20/2019   COPD with asthma ([place]) 02/10/2019   Epigastric pain 12/21/2018   OSA (obstructive sleep apnea) 12/02/2018   Joint swelling 10/24/2018   Environmental and seasonal allergies 10/24/2018   Greater trochanteric bursitis of right hip 09/28/2018   Bipolar I disorder, most recent episode (or current) manic ([place]) 05/20/2018   Vitamin D deficiency 02/15/2018   Preventative health care 11/24/2017   B12 deficiency 05/22/2016   Vertigo 03/16/2016   Medicare annual wellness visit, subsequent 10/25/2015   Chronic back pain 10/31/2014   Bipolar disorder ([place]) 05/21/2014   Pre-operative clearance 01/22/2014   Deformity of right foot 12/27/2013   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 09/15/2013   Insomnia 09/01/2011   Obesity 05/04/2011   HLD (hyperlipidemia) 01/20/2011   Chest pain 01/20/2011   Chronic pain syndrome 01/07/2011   RENAL CALCULUS, RECURRENT 11/13/2010   Multiple sclerosis, progressive relapsing 08/28/2010   GERD 08/28/2010   OA (osteoarthritis) of hip 08/28/2010   NEPHROLITHIASIS, HX OF 08/28/2010    Immunization History  Administered Date(s) Administered   Influenza Split 08/05/2011   Influenza, Seasonal, Injecte, Preservative Fre 06/26/2018   Influenza,inj,Quad PF,6+ Mos 08/17/2013, 07/13/2015, 07/06/2017, 06/26/2018, 06/26/2019, 07/04/2020, 09/03/2021   Influenza-Unspecified 07/08/2016, 06/18/2017, 06/26/2018   PFIZER(Purple Top)SARS-COV-2 Vaccination 02/06/2020, 02/28/2020, 07/01/2020, 03/19/2021   Pfizer Covid-19 Vaccine Bivalent Booster 69yrs & up 09/03/2021   Pneumococcal Conjugate-13 07/05/2014   Pneumococcal Polysaccharide-23 07/13/2015    Conditions to be addressed/monitored:  Hyperlipidemia, Diabetes, COPD, and Asthma, MS, Tachycardia, Bipolar Disorder   Care Plan : [place]  Updates made by [name], [place] since 01/06/2022 12:00 AM     Problem: Hyperlipidemia, Diabetes, COPD, and Asthma, MS,  Tachycardia, Bipolar Disorder   Priority: High     Long-Range Goal: Disease management   Start Date: 09/15/2021  Expected End Date: 09/15/2022  This Visit's Progress: On track  Recent Progress: On track  Priority: High  Note:   Current Barriers:  Unable to independently monitor therapeutic efficacy Suboptimal therapeutic regimen for COPD/asthma  Pharmacist Clinical Goal(s):  Patient will achieve adherence to monitoring guidelines and medication adherence to achieve therapeutic efficacy adhere to plan to optimize therapeutic regimen for COPD/asthma as evidenced by report of adherence to recommended medication management changes through collaboration with PharmD and provider.   Interventions: 1:1 collaboration with [name], NP regarding development and update of comprehensive plan of care as evidenced by provider attestation and co-signature Inter-disciplinary care team collaboration (see longitudinal plan of care) Comprehensive medication review performed; medication list updated in electronic medical record  Diabetes (A1c goal <7%) -Controlled - A1c is at goal; pt has stopped metformin due to skin rash; she started [employer] - Diabetes program in January and reports it has been very helpful -Current medications: Trulicity 3 mg  weekly (Thurs) - Appropriate, Effective, Safe, Accessible Freestyle Libre 2 -Medications previously tried: metformin  -Educated on A1c and blood sugar goals; Carbohydrate counting and/or plate method -Recommend to continue current medication  Tachycardia (Goal: HR < 100) -Controlled - pt reports improvement in side effect since switching from succinate to tartrate; -Current treatment  Metoprolol tartrate 50 mg BID - Appropriate, Effective, Safe, Accessible -Recommended to continue current medication  Hyperlipidemia: (LDL goal < 100) -Controlled - LDL is at goal; pt endorses compliance with medications; she is intolerant to statins; she  uses Blunt for Repatha  -Current treatment: Repatha 140 mg q14 days - Appropriate, Effective, Safe, Query Accessible Fenofibrate 145 mg daily - Appropriate, Effective, Safe, Accessible Aspirin 81 mg daily - Appropriate, Effective, Safe, Accessible -Educated on Cholesterol goals;  -Recommended to continue current medication  COPD / asthma (Goal: control symptoms and prevent exacerbations) -Not ideally controlled - pt did not start Stiolto due to cost (non-formulary product); she was recently in hospital and they gave her Incruse which she tolerated well; per [facility] formularty Incruse is also non-preferred, Spiriva and Anoro are preferred LAMA-containing options -Pulmonary function testing: FEV1 at 63%, ratio 72, FVC 68% (04/2020) -Current treatment  Levalbuterol HFA prn - once a day typically - Appropriate, Effective, Safe, Accessible Levalbuterol nebulizer -Appropriate, Effective, Safe, Accessible Cetirizine 10 mg HS - Appropriate, Effective, Safe, Accessible Azelastine 137 mcg nasal spray PRN -Appropriate, Effective, Safe, Accessible -Medications previously tried: Firefighter, Advair   -Patient denies consistent use of maintenance inhaler -Frequency of rescue inhaler use: daily -Counseled on Benefits of consistent maintenance inhaler use -Recommend Spiriva Respimat 2.5 mcg/act 2 puffs daily - consulting with pulmonary  Bipolar Depression / Anxiety / Insomnia (Goal: manage symptoms) -Controlled - pt reports current regimen is working well enough for her -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 -managed per Dr Casimiro Needle -PHQ9: 15 (05/2021) - moderate/severe depression -Current treatment: Desipramine 25 mg daily -Appropriate, Effective, Safe, Accessible Duloxetine 20 mg - 2 cap daily -Appropriate, Effective, Safe, Accessible Lamotrigine 150 mg - 2 tab HS -Appropriate, Effective, Safe, Accessible Temazepam 30 mg 1-2 cap HS -Appropriate,  Effective, Safe, Accessible Trazodone 50 mg - 2 tab HS -Appropriate, Effective, Safe, Accessible Hydroxyzine 50 mg - 2 tab HS -Appropriate, Effective, Safe, Accessible -Medications previously tried/failed: Rexulti -Educated on Benefits of medication for symptom control; discussed consequences of abruptly stopping medications, advised that she speak with psychiatrist if she is thinking about stopping any medications -Recommended to continue current medication  Multiple Sclerosis (Goal: manage symptoms) -Not ideally controlled  - pt has not started dimethyl fumarate yet, it went to specialty pharmacy and she has not been able to get it approved; pharmacy assistant spoke [facility] and they said it needs a PA - I completed the PA through covermymeds today and the medication has been approved (Key: [id number]) -Current treatment  Dimethyl fumarate 120 mg BID - not started Methocarbamol 500 mg - Appropriate, Effective, Safe, Accessible -Recommended to start dimethyl fumarate as prescribed; pharmacy assistant will follow [facility] with [facility] to ensure it is filled and delivered to patient  Eczema (Goal: manage symptoms) -Not ideally controlled - pt reports red bumps all over her skin; she follows regularly with dermatology -Current treatment  Clindamycin solution - not taking Clobetasol foam, solution - uses sometimes Fluocinonide solution Hydrocortisone cream - uses sometimes Neosporin Benadryl cream -Counseled on use/benefits of topical products; removed ciclopirox, clindamycin from medication list;  -Counseled on risks of  high-potency topical steroids (clobetasol) and advised to avoid frequent/daily use -Advised to follow up with dermatology regarding skin bumps  Anemia (Goal: improve HGB) -Improving - pt was recently diagnosed with anemia while in hospital, iron levels have improved since taking OTC iron; she has been referred to hematology to further evaluate anemia -Current  treatment  Ferrous sulfate 325 mg BID -Advised to take iron with Vitamin C to maximize absorption -Recommend to continue current medication; f/u with hematology as scheduled  Patient Goals/Self-Care Activities Patient will:  - take medications as prescribed as evidenced by patient report and record review focus on medication adherence by routine collaborate with provider on medication access solutions (Trulicity, Repatha)      Medication Assistance:  Trulicity - [place] PAP approved through 11/15/22 [place] 11/05/21 - 11/04/22  ID: [id number]  GRP: [id number]  BIN: [id number] PCN: [id number]  Compliance/Adherence/Medication fill history: Care Gaps: None  Star-Rating Drugs: Trulicity - [facility] (approved through 11/15/22)  Patient's preferred pharmacy is:  [facility] [place] [address] [place] [address] Phone: [phone] Fax: [phone]  [id number] [place] [address] [place] [address] Phone: [phone] Fax: [phone]   Uses pill box? No - prefers bottles Pt endorses 100% compliance  We discussed: Current pharmacy is preferred with insurance plan and patient is satisfied with pharmacy services Patient decided to: Continue current medication management strategy  Care Plan and Follow Up Patient Decision:  Patient agrees to Care Plan and Follow-up.  Plan: Telephone follow up appointment with care management team member scheduled for:  3 months  Charlene Brooke, PharmD, [facility] Clinical Pharmacist [facility] [phone]

## 2022-01-06 NOTE — Telephone Encounter (Signed)
I completed the PA for dimethyl fumarate and it has been approved. Will contact [facility] to inform them.  Key: Emily Moon

## 2022-01-06 NOTE — Patient Instructions (Signed)
Visit Information  Phone number for Pharmacist: [phone]   Goals Addressed             This Visit's Progress    Manage My Medicine       Timeframe:  Long-Range Goal Priority:  High Start Date:     09/10/21                    Expected End Date:     09/10/22                  Follow Up Date May 2023   - call for medicine refill 2 or 3 days before it runs out - call if I am sick and can't take my medicine - keep a list of all the medicines I take; vitamins and herbals too - use a pillbox to sort medicine  -collaborate with provider on medication access solutions (Trulicity, Repatha, Stiolto)   Why is this important?   These steps will help you keep on track with your medicines.   Notes:         Care Plan : CCM Pharmacy Care Plan  Updates made by [name], RPH since 01/06/2022 12:00 AM     Problem: Hyperlipidemia, Diabetes, COPD, and Asthma, MS, Tachycardia, Bipolar Disorder   Priority: High     Long-Range Goal: Disease management   Start Date: 09/15/2021  Expected End Date: 09/15/2022  This Visit's Progress: On track  Recent Progress: On track  Priority: High  Note:   Current Barriers:  Unable to independently monitor therapeutic efficacy Suboptimal therapeutic regimen for COPD/asthma  Pharmacist Clinical Goal(s):  Patient will achieve adherence to monitoring guidelines and medication adherence to achieve therapeutic efficacy adhere to plan to optimize therapeutic regimen for COPD/asthma as evidenced by report of adherence to recommended medication management changes through collaboration with PharmD and provider.   Interventions: 1:1 collaboration with [name], NP regarding development and update of comprehensive plan of care as evidenced by provider attestation and co-signature Inter-disciplinary care team collaboration (see longitudinal plan of care) Comprehensive medication review performed; medication list updated in electronic  medical record  Diabetes (A1c goal <7%) -Controlled - A1c is at goal; pt has stopped metformin due to skin rash; she started [employer] - Diabetes program in January and reports it has been very helpful -Current medications: Trulicity 3 mg weekly (Thurs) - Appropriate, Effective, Safe, Accessible Freestyle Libre 2 -Medications previously tried: metformin  -Educated on A1c and blood sugar goals; Carbohydrate counting and/or plate method -Recommend to continue current medication  Tachycardia (Goal: HR < 100) -Controlled - pt reports improvement in side effect since switching from succinate to tartrate; -Current treatment  Metoprolol tartrate 50 mg BID - Appropriate, Effective, Safe, Accessible -Recommended to continue current medication  Hyperlipidemia: (LDL goal < 100) -Controlled - LDL is at goal; pt endorses compliance with medications; she is intolerant to statins; she uses [facility] for Repatha  -Current treatment: Repatha 140 mg q14 days - Appropriate, Effective, Safe, Query Accessible Fenofibrate 145 mg daily - Appropriate, Effective, Safe, Accessible Aspirin 81 mg daily - Appropriate, Effective, Safe, Accessible -Educated on Cholesterol goals;  -Recommended to continue current medication  COPD / asthma (Goal: control symptoms and prevent exacerbations) -Not ideally controlled - pt did not start Stiolto due to cost (non-formulary product); she was recently in hospital and they gave her Incruse which she tolerated well; per [facility] formularty Incruse is also non-preferred, Spiriva and Anoro are preferred LAMA-containing  options -Pulmonary function testing: FEV1 at 63%, ratio 72, FVC 68% (04/2020) -Current treatment  Levalbuterol HFA prn - once a day typically - Appropriate, Effective, Safe, Accessible Levalbuterol nebulizer -Appropriate, Effective, Safe, Accessible Cetirizine 10 mg HS - Appropriate, Effective, Safe, Accessible Azelastine 137 mcg nasal spray PRN  -Appropriate, Effective, Safe, Accessible -Medications previously tried: Firefighter, Advair   -Patient denies consistent use of maintenance inhaler -Frequency of rescue inhaler use: daily -Counseled on Benefits of consistent maintenance inhaler use -Recommend Spiriva Respimat 2.5 mcg/act 2 puffs daily - consulting with pulmonary  Bipolar Depression / Anxiety / Insomnia (Goal: manage symptoms) -Controlled - pt reports current regimen is working well enough for her -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 -managed per Dr Casimiro Needle -PHQ9: 15 (05/2021) - moderate/severe depression -Current treatment: Desipramine 25 mg daily -Appropriate, Effective, Safe, Accessible Duloxetine 20 mg - 2 cap daily -Appropriate, Effective, Safe, Accessible Lamotrigine 150 mg - 2 tab HS -Appropriate, Effective, Safe, Accessible Temazepam 30 mg 1-2 cap HS -Appropriate, Effective, Safe, Accessible Trazodone 50 mg - 2 tab HS -Appropriate, Effective, Safe, Accessible Hydroxyzine 50 mg - 2 tab HS -Appropriate, Effective, Safe, Accessible -Medications previously tried/failed: Rexulti -Educated on Benefits of medication for symptom control; discussed consequences of abruptly stopping medications, advised that she speak with psychiatrist if she is thinking about stopping any medications -Recommended to continue current medication  Multiple Sclerosis (Goal: manage symptoms) -Not ideally controlled  - pt has not started dimethyl fumarate yet, it went to specialty pharmacy and she has not been able to get it approved; [facility] and [facility] said [facility] needs a [facility] - I completed the PA through covermymeds today and the medication has been approved (Key: [id number]) -Current treatment  Dimethyl fumarate 120 mg BID - not started Methocarbamol 500 mg - Appropriate, Effective, Safe, Accessible -Recommended to start dimethyl fumarate as prescribed; pharmacy assistant  [facility] ensure [facility] is [facility] and delivered to patient  Eczema (Goal: manage symptoms) -Not ideally controlled - pt reports red bumps all over her skin; she follows regularly with dermatology -Current treatment  Clindamycin solution - not taking Clobetasol foam, solution - uses sometimes Fluocinonide solution Hydrocortisone cream - uses sometimes Neosporin Benadryl cream -Counseled on use/benefits of topical products; removed ciclopirox, clindamycin from medication list;  -Counseled on risks of high-potency topical steroids (clobetasol) and advised to avoid frequent/daily use -Advised to follow up with dermatology regarding skin bumps  Anemia (Goal: improve HGB) -Improving - pt was recently diagnosed with anemia while in hospital, iron levels have improved since taking OTC iron; she has been referred to hematology to further evaluate anemia -Current treatment  Ferrous sulfate 325 mg BID -Advised to take iron with Vitamin C to maximize absorption -Recommend to continue current medication; f/u with hematology as scheduled  Patient Goals/Self-Care Activities Patient will:  - take medications as prescribed as evidenced by patient report and record review focus on medication adherence by routine collaborate with provider on medication access solutions (Trulicity, Repatha)      Patient verbalizes understanding of instructions and care plan provided today and agrees to view in [place]. Active MyChart status confirmed with patient.   Telephone follow up appointment with pharmacy team member scheduled for: 3 months  Charlene Brooke, PharmD, [facility] Clinical Pharmacist [facility] [phone]

## 2022-01-07 NOTE — Progress Notes (Addendum)
[facility]ed [facility] in regards to patient's PA approval for Dimethyl Fumarate. Direct number for Or[place]a's care team is [phone]. I verified and they do have the patient's correct phone number. Patient will have to call to start the dispensing process as they need to ask her some clinical questions; they won't accept it from the providers office. I have called the patient to inform her and give her the direct number; no answer;  left a message.   Charlene Brooke, CPP notified  Marijean Niemann, [place] Clinical Pharmacy Assistant [phone]

## 2022-01-08 ENCOUNTER — Telehealth: Payer: Self-pay

## 2022-01-08 NOTE — Progress Notes (Signed)
Called patient; no answer; left a message.   Charlene Brooke, CPP notified  Marijean Niemann, [place] Clinical Pharmacy Assistant [phone]  Time Spent: 3 Minutes other patient time

## 2022-01-08 NOTE — Progress Notes (Signed)
° ° °  Chronic Care Management Pharmacy Assistant   Name: Emily Moon  MRN: [id number] DOB: Dec 08, 1960  Reason for Encounter: CCM (Dimethyl Fumarate PA Approval)   01/08/2022 I called patient; no answer; left message  01/07/2022 Called [facility] in regards to patient's PA approval for Dimethyl Fumarate. Direct number for Sherae's care team is [phone]. I verified and they do have the patient's correct phone number. Patient will have to call to start the dispensing process as they need to ask her some clinical questions; they won't accept it from the providers office. I have called the patient to inform her and give her the direct number; no answer;  left a message.   Charlene Brooke, CPP notified  Marijean Niemann, [place] Clinical Pharmacy Assistant [phone]

## 2022-01-09 ENCOUNTER — Encounter: Payer: Self-pay | Admitting: *Deleted

## 2022-01-09 NOTE — Progress Notes (Signed)
[facility]  Telephone:(336) [phone] Fax:(336) [phone]  ID: Emily Moon OB: 1961/11/10  MR#: [id number]  QIO#:[id number]  Patient Care Team: Doreene Nest, NP as PCP - General (Internal Medicine) Debbe Odea, MD as PCP - Cardiology (Cardiology) Galen Manila, MD as Referring Physician (Ophthalmology) Kathyrn Sheriff, [facility] as Pharmacist (Pharmacist) Jeralyn Ruths, MD as Consulting Physician (Hematology and Oncology)  CHIEF COMPLAINT: Iron deficiency anemia.  INTERVAL HISTORY: Patient is a 61 year old female who was noted to have decreased hemoglobin and iron stores on routine blood work.  She has chronic weakness and fatigue, otherwise feels well.  She states oral iron supplementation makes her constipated.  She has no neurologic complaints.  She denies any recent fevers or illnesses.  She has a good appetite and denies weight loss.  She has no chest pain, shortness of breath, cough, or hemoptysis.  She denies any nausea, vomiting, constipation, or diarrhea.  She has no melena or hematochezia.  She has no urinary complaints.  Patient offers no further specific complaints today.  REVIEW OF SYSTEMS:   Review of Systems  Constitutional:  Positive for malaise/fatigue. Negative for fever and weight loss.  Respiratory: Negative.  Negative for cough, hemoptysis and shortness of breath.   Cardiovascular: Negative.  Negative for chest pain and leg swelling.  Gastrointestinal: Negative.  Negative for abdominal pain, blood in stool and melena.  Genitourinary: Negative.  Negative for hematuria.  Musculoskeletal: Negative.  Negative for back pain.  Skin: Negative.  Negative for rash.  Neurological:  Positive for weakness. Negative for dizziness, focal weakness and headaches.  Psychiatric/Behavioral: Negative.  The patient is not nervous/anxious.    As per HPI. Otherwise, a complete review of systems is negative.  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   History of kidney stones    Hypercholesterolemia    IDA (iron deficiency anemia)    Multiple sclerosis (HCC)    weakness   Osteoporosis    osteoarthritis   Pneumonia    PONV (postoperative nausea and vomiting)    no problem after cataract surgery   Prediabetes 07/22/2018   Psoriasis    Shortness of breath dyspnea    wheezing   Sleep apnea 2012   sleep study / slight, no interventions   Urinary frequency    Weight gain 06/21/2014    PAST SURGICAL HISTORY: Past Surgical History:  Procedure Laterality Date   CATARACT EXTRACTION W/PHACO Left 05/21/2015   Procedure: CATARACT EXTRACTION PHACO AND INTRAOCULAR LENS PLACEMENT (IOC);  Surgeon: Galen Manila, MD;  Location: [facility] ORS;  Service: Ophthalmology;  Laterality: Left;  [place] 00:35 AP% 22.9 CDE 8.11 fluid pack lot #[id number]   CATARACT EXTRACTION W/PHACO Right 06/04/2015   Procedure: CATARACT EXTRACTION PHACO AND INTRAOCULAR LENS PLACEMENT (IOC);  Surgeon: Galen Manila, MD;  Location: [facility] ORS;  Service: Ophthalmology;  Laterality: Right;  US:00:48 AP%: 10.5  CDE:5.08 Fluid lot #[id number]   CYSTOSCOPY/URETEROSCOPY/HOLMIUM LASER/STENT PLACEMENT Bilateral 09/22/2016   Procedure: CYSTOSCOPY/URETEROSCOPY/HOLMIUM LASER/STENT PLACEMENT;  Surgeon: Vanna Scotland, MD;  Location: [facility] OR[facility];  Service: Urology;  Laterality: Bilateral;   EYE SURGERY  2015   tissue biopsy   FOOT SURGERY  2015   JOINT REPLACEMENT Left 2013    hip replacement   LITHOTRIPSY     PTOSIS REPAIR Bilateral 02/18/2016   Procedure: BILATERAL PTOSIS REPAIR UPPER EYELIDS;  Surgeon: Imagene Riches, MD;  Location: [facility];  Service: Ophthalmology;  Laterality: Bilateral;  LEAVE PT EARLY AM   thumb surgery Right    TONSILLECTOMY  1973   TOTAL HIP ARTHROPLASTY Right 07/02/2021   Procedure: TOTAL HIP ARTHROPLASTY ANTERIOR APPROACH;  Surgeon: Ollen Gross, MD;  Location: [facility];  Service: Orthopedics;  Laterality: Right;    FAMILY HISTORY: Family History  Problem Relation Age of Onset   Cancer Father        Abdomen with mastasis   Cancer Mother    Heart disease Mother    Kidney disease Neg Hx    Bladder Cancer Neg Hx    Prostate cancer Neg Hx    Kidney cancer Neg Hx     ADVANCED DIRECTIVES (Y/N):  N  HEALTH MAINTENANCE: Social History   Tobacco Use   Smoking status: Former    Packs/day: 1.00    Years: 25.00    Pack years: 25.00    Types: Cigarettes    Quit date: 2019    Years since quitting: 4.1   Smokeless tobacco: Never   Tobacco comments:    occasional use  Vaping Use   Vaping Use: Former  Substance Use Topics   Alcohol use: No    Alcohol/week: 0.0 standard drinks   Drug use: Yes    Types: Methylphenidate, 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 Her reaction is swelling of the lower extremities.    Cefaclor Other (See Comments)    Doesn't remember---unsure if actually allergic    Sulfa  Antibiotics Itching    Unsure of reaction possibly itching   Tramadol Itching and Nausea And Vomiting   Zinc Other (See Comments)    constipation  constipation    Aripiprazole Other (See Comments)    Muscle tension/cramping   Diclofenac Sodium Rash    "made very sick"    Current Outpatient Medications  Medication Sig Dispense Refill   Azelastine HCl 137 MCG/SPRAY SOLN PLACE 2 SPRAYS INTO BOTH NOSTRILS 2 (TWO) TIMES DAILY 30 mL 5   B Complex-Biotin-FA (BIG 100, BIOTIN, PO) Take by mouth.     cetirizine (ZYRTEC) 10 MG tablet Take 10 mg by mouth daily.     clindamycin (CLEOCIN T) 1 % external solution Apply to scalp once or twice a day prn bumps 30 mL 2   clobetasol (OLUX) 0.05 % topical foam Apply to affected areas at arms, legs, trunk twice a  day up to 2 weeks. Avoid applying to face, groin, and axilla. Use as directed. Risk of skin atrophy with long-term use reviewed. 50 g 0   clobetasol (TEMOVATE) 0.05 % external solution Mix clobetasol solution with  CeraVe cream Use twice daily to affected areas.Avoid Face, groin and underarm 50 mL 0   Continuous Blood Gluc Sensor (FREESTYLE LIBRE 14 DAY SENSOR) MISC PLACE INTO SKIN FOR CONTINUOUS CHECK OF BLOOD SUGAR DAILY 6 each 2   desipramine (NORPRAMIN) 25 MG tablet Take 1 tablet (25 mg total) by mouth daily. 30 tablet 6   Dimethyl Fumarate 120 & 240 MG MISC Take by mouth. Take 120 mg twice daily x 7 days then increase to 240 mg twice daily     diphenhydrAMINE (BENADRYL) 2 % cream Apply topically 3 (three) times daily as needed for itching.     Dulaglutide (TRULICITY) 3 MG/0.5ML SOPN Inject 3 mg as directed once a week. For diabetes. 2 mL 0   DULoxetine (CYMBALTA) 20 MG capsule Take 2 capsules (40 mg total) by mouth daily. 180 capsule 2   Evolocumab (REPATHA SURECLICK) 140 MG/ML SOAJ Inject 1 Dose into the skin every 14 (fourteen) days. 2 mL 3   famotidine (PEPCID) 20 MG tablet Take 1 tablet (20 mg total) by mouth 2 (two) times daily. 180 tablet 3    fenofibrate (TRICOR) 145 MG tablet Take 1 tablet (145 mg total) by mouth daily. 30 tablet 2   ferrous sulfate 325 (65 FE) MG tablet Take 1 tablet (325 mg total) by mouth 2 (two) times daily with a meal. 60 tablet 3   fluocinonide (LIDEX) 0.05 % external solution Apply 1 application topically 2 (two) times daily as needed. Apply to scalp twice daily as need for itch 180 mL 3   hydrocortisone 2.5 % cream Apply 1 application topically 2 (two) times daily as needed (underarm/groin rash).     hydrOXYzine (VISTARIL) 50 MG capsule Take 2 capsules (100 mg total) by mouth at bedtime. TAKE 1 CAPSLE BY MOUTH AT NOON, 1 AT 6 PM, AND 1 AS NEEDED 90 capsule 3   lamoTRIgine (LAMICTAL) 150 MG tablet Take 2 tablets (300 mg total) by mouth at bedtime. 180 tablet 1   levalbuterol (XOPENEX HFA) 45 MCG/ACT inhaler INHALE 1 TO 2 PUFFS BY MOUTH EVERY 6 HOURS AS NEEDED FOR WHEEZE 1 each 0   Lysine 500 MG TABS Take 200 mg by mouth daily.     meclizine (ANTIVERT) 25 MG tablet Take 0.5-1 tablets (12.5-25 mg total) by mouth 3 (three) times daily as needed for dizziness (sedation caution). 30 tablet 0   methocarbamol (ROBAXIN) 500 MG tablet TAKE 1-2 TABLETS (500-1,000 MG TOTAL) BY MOUTH AT BEDTIME AS NEEDED FOR MUSCLE SPASMS. 180 tablet 0   neomycin-bacitracin-polymyxin (NEOSPORIN) [phone] ointment Apply 1 application topically 4 (four) times daily as needed (for cut/scrapes.).     pantoprazole (PROTONIX) 40 MG tablet Take 1 tablet (40 mg total) by mouth daily. For heartburn 90 tablet 3   polyethylene glycol powder (GLYCOLAX/MIRALAX) powder MIX 17 GRAMS (1 CAPFUL) WITH 4-8 OZ OF LIQUID AND TAKE BY MOUTH TWICE DAILY AS NEEDED 527 g 0   polyvinyl alcohol (LIQUIFILM TEARS) 1.4 % ophthalmic solution Place 2 drops into both eyes at bedtime.     temazepam (RESTORIL) 30 MG capsule TAKE ONE TO TWO CAPSULES BY MOUTH NIGHTLY AT BEDTIME 60 capsule 0   traZODone (DESYREL) 50 MG tablet Take 2 tablets (100 mg total) by mouth at bedtime.  60  tablet 7   valACYclovir (VALTREX) 1000 MG tablet TAKE 1 TABLET (1,000 MG TOTAL) BY MOUTH DAILY. FOR HERPES OUTBREAK PREVENTION 90 tablet 0   metoprolol tartrate (LOPRESSOR) 50 MG tablet Take 1 tablet (50 mg total) by mouth 2 (two) times daily. 180 tablet 3   No current facility-administered medications for this visit.    OBJECTIVE: Vitals:   01/15/22 1500  BP: 113/68  Pulse: 82  Temp: 98.1 F (36.7 C)     Body mass index is 34.98 kg/m.    ECOG FS:0 - Asymptomatic  General: Well-developed, well-nourished, no acute distress. Eyes: Pink conjunctiva, anicteric sclera. HEENT: Normocephalic, moist mucous membranes. Lungs: No audible wheezing or coughing. Heart: Regular rate and rhythm. Abdomen: Soft, nontender, no obvious distention. Musculoskeletal: No edema, cyanosis, or clubbing. Neuro: Alert, answering all questions appropriately. Cranial nerves grossly intact. Skin: No rashes or petechiae noted. Psych: Normal affect. Lymphatics: No cervical, calvicular, axillary or inguinal LAD.   LAB RESULTS:  Lab Results  Component Value Date   NA 140 12/31/2021   K 4.4 12/31/2021   CL 103 12/31/2021   CO2 28 12/31/2021   GLUCOSE 116 (H) 12/31/2021   BUN 16 12/31/2021   CREATININE 1.01 12/31/2021   CALCIUM 9.0 12/31/2021   PROT 7.3 12/24/2021   ALBUMIN 3.8 12/24/2021   AST 22 12/24/2021   ALT 20 12/24/2021   ALKPHOS 55 12/24/2021   BILITOT 0.7 12/24/2021   GFRNONAA >60 12/27/2021   GFRAA >60 02/19/2020    Lab Results  Component Value Date   WBC 10.7 (H) 12/31/2021   NEUTROABS 9.9 (H) 12/25/2021   HGB 11.1 (L) 12/31/2021   HCT 33.8 (L) 12/31/2021   MCV 82.3 12/31/2021   PLT 814.0 (H) 12/31/2021   Lab Results  Component Value Date   IRON 57 12/31/2021   TIBC 481.6 (H) 12/31/2021   IRONPCTSAT 11.8 (L) 12/31/2021   Lab Results  Component Value Date   FERRITIN 32.7 12/31/2021     STUDIES: DG Chest Port 1 View  Result Date: 12/24/2021 CLINICAL DATA:  Possible  sepsis. EXAM: PORTABLE CHEST 1 VIEW COMPARISON:  Chest x-ray dated June 30, 2021. FINDINGS: The heart size and mediastinal contours are within normal limits. Both lungs are clear. The visualized skeletal structures are unremarkable. IMPRESSION: No active disease. Electronically Signed   By: Obie Dredge M.D.   On: 12/24/2021 17:44   CT Renal Stone Study  Result Date: 12/24/2021 CLINICAL DATA:  Flank pain EXAM: CT ABDOMEN AND PELVIS WITHOUT CONTRAST TECHNIQUE: Multidetector CT imaging of the abdomen and pelvis was performed following the standard protocol without IV contrast. RADIATION DOSE REDUCTION: This exam was performed according to the departmental dose-optimization program which includes automated exposure control, adjustment of the mA and/or kV according to patient size and/or use of iterative reconstruction technique. COMPARISON:  08/31/2018 FINDINGS: Lower chest: Unremarkable. Hepatobiliary: Liver measures 21.4 cm in length. No focal abnormality is seen. Gallbladder is distended. There is no wall thickening in the gallbladder. There is no fluid around the gallbladder. There is no dilation of bile ducts. Pancreas: No focal abnormality is seen. Spleen: Spleen measures 12.6 cm in maximum diameter. Adrenals/Urinary Tract: Adrenals are not enlarged. There is no hydronephrosis. There are no renal or ureteral stones. Urinary bladder is partly obscured by severe beam hardening artifacts. Stomach/Bowel: Stomach is not distended. Small bowel loops are not dilated. Cecum is lower than usual in the position. There is a tubular structure with air in the lumen in the right pelvic  cavity, possibly normal appendix. There is no significant wall thickening in colon. Scattered diverticula are seen in colon without signs of focal diverticulitis. Vascular/Lymphatic: Unremarkable. Reproductive: Evaluation is limited by severe beam hardening artifacts from bilateral hip arthroplasty. Other: There is no ascites or  pneumoperitoneum. Small umbilical hernia containing fat is seen. Musculoskeletal: There is minimal anterolisthesis at L4-L5 level. Degenerative changes are noted with disc space narrowing, bony spurs and facet hypertrophy at multiple levels. There is previous bilateral hip arthroplasty. IMPRESSION: There is no evidence of intestinal obstruction or pneumoperitoneum. There is no hydronephrosis. Appendix is not dilated. Appendix is lower than usual in the pelvic cavity. Gallbladder is distended. This may be due to fasting state or suggest cholecystitis. There are no definite imaging signs of acute cholecystitis. Please correlate with clinical symptoms and laboratory findings. Few diverticula are seen in the colon without evidence of focal acute diverticulitis. Enlarged liver and spleen.  Lumbar spondylosis. Other findings as described in the body of the report. Electronically Signed   By: Ernie Avena M.D.   On: 12/24/2021 17:47    ASSESSMENT: Iron deficiency anemia.  PLAN:    Iron deficiency anemia: Patient's hemoglobin and iron stores are only mildly reduced, but she is symptomatic.  She reports constipation with oral iron supplementation.  Return to clinic 3 times over the next 3 weeks to receive 200 mg of IV Venofer.  Patient will then return to clinic in 4 months with repeat laboratory work, further evaluation, and continuation of treatment if needed.  If her hemoglobin does not improve with IV iron, will consider full anemia work-up at that time. Thrombocytosis: Likely reactive and secondary to iron deficiency anemia.  IV iron as above. Leukocytosis: Mild, monitor.  I spent a total of 45 minutes reviewing chart data, face-to-face evaluation with the patient, counseling and coordination of care as detailed above.   Patient expressed understanding and was in agreement with this plan. She also understands that She can call clinic at any time with any questions, concerns, or complaints.     Jeralyn Ruths, MD   01/15/2022 3:59 PM

## 2022-01-09 NOTE — Progress Notes (Signed)
Called patient; gave her the number to [facility]. She is going to call so her medication can be dispensed.   Charlene Brooke, CPP notified  Marijean Niemann, [place] Clinical Pharmacy Assistant [phone]

## 2022-01-13 DIAGNOSIS — D509 Iron deficiency anemia, unspecified: Secondary | ICD-10-CM | POA: Diagnosis not present

## 2022-01-13 DIAGNOSIS — E119 Type 2 diabetes mellitus without complications: Secondary | ICD-10-CM

## 2022-01-13 DIAGNOSIS — J449 Chronic obstructive pulmonary disease, unspecified: Secondary | ICD-10-CM | POA: Diagnosis not present

## 2022-01-13 DIAGNOSIS — E785 Hyperlipidemia, unspecified: Secondary | ICD-10-CM | POA: Diagnosis not present

## 2022-01-14 ENCOUNTER — Other Ambulatory Visit: Payer: Self-pay

## 2022-01-14 ENCOUNTER — Ambulatory Visit: Payer: PPO | Admitting: Podiatry

## 2022-01-14 ENCOUNTER — Encounter: Payer: Self-pay | Admitting: Podiatry

## 2022-01-14 DIAGNOSIS — E119 Type 2 diabetes mellitus without complications: Secondary | ICD-10-CM

## 2022-01-14 NOTE — Progress Notes (Signed)
She presents today after having not seen her for quite some time myself for a diabetic check.  She states that recently she has become diabetic and her hemoglobin A1c is now at 6.7.  She still has troubles with MS and has had skin lesions in the palms of her hands and along her arms with skin atrophy on the extensor side of her hands.  She states that she still has numbness and tingling in her feet and she still has some mild balance issues.  States that she has purchased new shoes which are very comfortable and seem to help. ? ?Objective: Vital signs are stable she is alert and oriented x3.  Pulses are palpable.  Neurologic sensation is diminished per Semmes Weinstein monofilament and vibratory sensation all points of the foot.  Muscle strength is 4 out of 5 inverters and everters dorsiflexors and plantar flexors are 5 out of 5.  No open lesions or wounds are noted on the foot.  Skin is slightly dry and xerotic. ? ?Assessment: Diabetes and multiple sclerosis with neuropathy. ? ?Plan: Follow-up with her in 6 months for another check.  Should her balance worsen or her legs get weak she will notify us immediately. ?

## 2022-01-15 ENCOUNTER — Inpatient Hospital Stay: Payer: PPO

## 2022-01-15 ENCOUNTER — Inpatient Hospital Stay: Payer: PPO | Attending: Oncology | Admitting: Oncology

## 2022-01-15 ENCOUNTER — Encounter: Payer: Self-pay | Admitting: Oncology

## 2022-01-15 VITALS — BP 113/68 | HR 82 | Temp 98.1°F | Wt 236.9 lb

## 2022-01-15 DIAGNOSIS — F1290 Cannabis use, unspecified, uncomplicated: Secondary | ICD-10-CM | POA: Insufficient documentation

## 2022-01-15 DIAGNOSIS — K5730 Diverticulosis of large intestine without perforation or abscess without bleeding: Secondary | ICD-10-CM | POA: Insufficient documentation

## 2022-01-15 DIAGNOSIS — Z8249 Family history of ischemic heart disease and other diseases of the circulatory system: Secondary | ICD-10-CM | POA: Diagnosis not present

## 2022-01-15 DIAGNOSIS — D72829 Elevated white blood cell count, unspecified: Secondary | ICD-10-CM | POA: Insufficient documentation

## 2022-01-15 DIAGNOSIS — D509 Iron deficiency anemia, unspecified: Secondary | ICD-10-CM | POA: Diagnosis not present

## 2022-01-15 DIAGNOSIS — M797 Fibromyalgia: Secondary | ICD-10-CM | POA: Diagnosis not present

## 2022-01-15 DIAGNOSIS — K429 Umbilical hernia without obstruction or gangrene: Secondary | ICD-10-CM | POA: Diagnosis not present

## 2022-01-15 DIAGNOSIS — Z888 Allergy status to other drugs, medicaments and biological substances status: Secondary | ICD-10-CM | POA: Diagnosis not present

## 2022-01-15 DIAGNOSIS — G35 Multiple sclerosis: Secondary | ICD-10-CM | POA: Diagnosis not present

## 2022-01-15 DIAGNOSIS — M47816 Spondylosis without myelopathy or radiculopathy, lumbar region: Secondary | ICD-10-CM | POA: Insufficient documentation

## 2022-01-15 DIAGNOSIS — Z882 Allergy status to sulfonamides status: Secondary | ICD-10-CM | POA: Insufficient documentation

## 2022-01-15 DIAGNOSIS — K219 Gastro-esophageal reflux disease without esophagitis: Secondary | ICD-10-CM | POA: Insufficient documentation

## 2022-01-15 DIAGNOSIS — J449 Chronic obstructive pulmonary disease, unspecified: Secondary | ICD-10-CM | POA: Insufficient documentation

## 2022-01-15 DIAGNOSIS — Z79899 Other long term (current) drug therapy: Secondary | ICD-10-CM | POA: Diagnosis not present

## 2022-01-15 DIAGNOSIS — K5900 Constipation, unspecified: Secondary | ICD-10-CM | POA: Insufficient documentation

## 2022-01-15 DIAGNOSIS — Z87442 Personal history of urinary calculi: Secondary | ICD-10-CM | POA: Insufficient documentation

## 2022-01-15 DIAGNOSIS — Z885 Allergy status to narcotic agent status: Secondary | ICD-10-CM | POA: Diagnosis not present

## 2022-01-15 DIAGNOSIS — Z881 Allergy status to other antibiotic agents status: Secondary | ICD-10-CM | POA: Diagnosis not present

## 2022-01-15 DIAGNOSIS — G4730 Sleep apnea, unspecified: Secondary | ICD-10-CM | POA: Insufficient documentation

## 2022-01-15 DIAGNOSIS — M109 Gout, unspecified: Secondary | ICD-10-CM | POA: Insufficient documentation

## 2022-01-15 DIAGNOSIS — R5383 Other fatigue: Secondary | ICD-10-CM | POA: Insufficient documentation

## 2022-01-15 DIAGNOSIS — M4316 Spondylolisthesis, lumbar region: Secondary | ICD-10-CM | POA: Insufficient documentation

## 2022-01-15 DIAGNOSIS — E119 Type 2 diabetes mellitus without complications: Secondary | ICD-10-CM | POA: Diagnosis not present

## 2022-01-15 DIAGNOSIS — D75839 Thrombocytosis, unspecified: Secondary | ICD-10-CM | POA: Insufficient documentation

## 2022-01-15 DIAGNOSIS — Z809 Family history of malignant neoplasm, unspecified: Secondary | ICD-10-CM | POA: Insufficient documentation

## 2022-01-17 ENCOUNTER — Other Ambulatory Visit: Payer: Self-pay | Admitting: Internal Medicine

## 2022-01-19 ENCOUNTER — Telehealth: Payer: Self-pay

## 2022-01-19 NOTE — Progress Notes (Signed)
? ? ?Chronic Care Management ?Pharmacy Assistant  ? ?Name: Emily Moon  MRN: [id number] DOB: April 29, 1961 ? ?Reason for Encounter: CCM (General Adherence) ?  ?Recent office visits:  ?None since last CCM contact ? ?Recent consult visits:  ?01/15/2022 - [name], MD - Oncology - Patient presented for Anemia.  Return to clinic 3 times over the next 3 weeks to receive 200 mg of IV Venofer.   ?01/14/2022 - [name], [place] - Patient presented for diabetes. Diabetic foot check.  ? ?Hospital visits:  ?None since last CCM Contact ? ?Medications: ?Outpatient Encounter Medications as of 01/19/2022  ?Medication Sig Note  ? Azelastine HCl 137 MCG/SPRAY SOLN PLACE 2 SPRAYS INTO BOTH NOSTRILS 2 (TWO) TIMES DAILY   ? B Complex-Biotin-FA (BIG 100, BIOTIN, PO) Take by mouth.   ? cetirizine (ZYRTEC) 10 MG tablet Take 10 mg by mouth daily.   ? clindamycin (CLEOCIN T) 1 % external solution Apply to scalp once or twice a day prn bumps   ? clobetasol (OLUX) 0.05 % topical foam Apply to affected areas at arms, legs, trunk twice a day up to 2 weeks. Avoid applying to face, groin, and axilla. Use as directed. Risk of skin atrophy with long-term use reviewed.   ? clobetasol (TEMOVATE) 0.05 % external solution Mix clobetasol solution with  CeraVe cream Use twice daily to affected areas.Avoid Face, groin and underarm   ? Continuous Blood Gluc Sensor (FREESTYLE LIBRE 14 DAY SENSOR) MISC PLACE INTO SKIN FOR CONTINUOUS CHECK OF BLOOD SUGAR DAILY   ? desipramine (NORPRAMIN) 25 MG tablet Take 1 tablet (25 mg total) by mouth daily.   ? Dimethyl Fumarate 120 & 240 MG MISC Take by mouth. Take 120 mg twice daily x 7 days then increase to 240 mg twice daily 01/06/2022: PA approved 01/06/22  ? diphenhydrAMINE (BENADRYL) 2 % cream Apply topically 3 (three) times daily as needed for itching.   ? Dulaglutide (TRULICITY) 3 VQ/2.5ZD SOPN Inject 3 mg as directed once a week. For diabetes. 12/05/2021: [place] pt assistance  ? DULoxetine  (CYMBALTA) 20 MG capsule Take 2 capsules (40 mg total) by mouth daily.   ? Evolocumab (REPATHA SURECLICK) 638 MG/ML SOAJ Inject 1 Dose into the skin every 14 (fourteen) days.   ? famotidine (PEPCID) 20 MG tablet Take 1 tablet (20 mg total) by mouth 2 (two) times daily.   ? fenofibrate (TRICOR) 145 MG tablet Take 1 tablet (145 mg total) by mouth daily.   ? ferrous sulfate 325 (65 FE) MG tablet Take 1 tablet (325 mg total) by mouth 2 (two) times daily with a meal.   ? fluocinonide (LIDEX) 0.05 % external solution Apply 1 application topically 2 (two) times daily as needed. Apply to scalp twice daily as need for itch   ? hydrocortisone 2.5 % cream Apply 1 application topically 2 (two) times daily as needed (underarm/groin rash).   ? hydrOXYzine (VISTARIL) 50 MG capsule Take 2 capsules (100 mg total) by mouth at bedtime. TAKE 1 CAPSLE BY MOUTH AT NOON, 1 AT 6 PM, AND 1 AS NEEDED   ? lamoTRIgine (LAMICTAL) 150 MG tablet Take 2 tablets (300 mg total) by mouth at bedtime.   ? levalbuterol (XOPENEX HFA) 45 MCG/ACT inhaler INHALE 1 TO 2 PUFFS BY MOUTH EVERY 6 HOURS AS NEEDED FOR WHEEZE   ? Lysine 500 MG TABS Take 200 mg by mouth daily.   ? meclizine (ANTIVERT) 25 MG tablet Take 0.5-1 tablets (12.5-25 mg total)  by mouth 3 (three) times daily as needed for dizziness (sedation caution).   ? methocarbamol (ROBAXIN) 500 MG tablet TAKE 1-2 TABLETS (500-1,000 MG TOTAL) BY MOUTH AT BEDTIME AS NEEDED FOR MUSCLE SPASMS. 12/29/2021: Takes 2 tablets every night ?  ? metoprolol tartrate (LOPRESSOR) 50 MG tablet Take 1 tablet (50 mg total) by mouth 2 (two) times daily.   ? neomycin-bacitracin-polymyxin (NEOSPORIN) [phone] ointment Apply 1 application topically 4 (four) times daily as needed (for cut/scrapes.).   ? pantoprazole (PROTONIX) 40 MG tablet Take 1 tablet (40 mg total) by mouth daily. For heartburn 12/29/2021: Takes BID  ? polyethylene glycol powder (GLYCOLAX/MIRALAX) powder MIX 17 GRAMS (1 CAPFUL) WITH 4-8 OZ OF LIQUID AND TAKE  BY MOUTH TWICE DAILY AS NEEDED   ? polyvinyl alcohol (LIQUIFILM TEARS) 1.4 % ophthalmic solution Place 2 drops into both eyes at bedtime.   ? temazepam (RESTORIL) 30 MG capsule TAKE ONE TO TWO CAPSULES BY MOUTH NIGHTLY AT BEDTIME   ? traZODone (DESYREL) 50 MG tablet Take 2 tablets (100 mg total) by mouth at bedtime.   ? valACYclovir (VALTREX) 1000 MG tablet TAKE 1 TABLET (1,000 MG TOTAL) BY MOUTH DAILY. FOR HERPES OUTBREAK PREVENTION   ? ?No facility-administered encounter medications on file as of 01/19/2022.  ? ?Contacted [name] on 01/19/2022 for general disease state and medication adherence call.  ? ?Patient is not more than 5 days past due for refill on the following medications per chart history: ? ?[facility] Medications: ?Medication Name/mg Last Fill Days Supply ?Trulicity 3 mg   PAP     ?  ?What concerns do you have about your medications? Patient stated she did not pick up Dimethyl Fumarate as the medication will cost her $1,100 for her copay.  ? ?Was patient able to start Dimethyl Fumarate?  Patient stated she did not pick the medication up as it will cost her $1,100 for her copay.  ? ?Did patient schedule a follow up with neurologist Dr Melrose Nakayama? Patient has not scheduled a follow up yet.  ? ?The patient reports the following side effects with their medications. Patient states he is so hungry on her Iron medications. She is very constipated as well. Patient stated she works so hard to lose all her weight and it is all back because of how hungry it makes her. Patient starts iron infusions on Thursday. She stated that if her Iron wasn't so dire she would not go through with the infusions due to the side effects.  ? ?How often do you forget or accidentally miss a dose? Never ? ?Do you use a pillbox? No ? ?Are you having any problems getting your medications from your pharmacy? Yes - Price of Dimethyl Fumarate ? ?Has the cost of your medications been a concern? Yes Dimethyl Fumarate - Copay is $1,100 ? ?The  patient has not had an ED visit since last contact.  ? ?The patient reports the following problems with their health.  Please see, "The patient reports the following side effects with their medications".  ? ?Patient denies concerns or questions for Charlene Brooke, PharmD at this time.  ? ?Care Gaps: ?Annual wellness visit in last year? Yes 06/03/2021 ?Most Recent BP reading: 113/68 on 01/15/2022 ? ?If Diabetic: ?Most recent A1C reading:  6.7 on 11/04/2021 ?Last eye exam / retinopathy screening: Up to date ?Last diabetic foot exam: Up to date ? ?Upcoming appointments: ?CCM appointment on 04/07/2022 ? ?Charlene Brooke, CPP notified ? ?Marijean Niemann, RMA ?Clinical Pharmacy Assistant ?[phone] ? ?  Time Spent: 30 Minutes ? ? ? ? ? ? ? ?

## 2022-01-21 ENCOUNTER — Encounter: Payer: Self-pay | Admitting: Family Medicine

## 2022-01-21 ENCOUNTER — Other Ambulatory Visit: Payer: Self-pay | Admitting: Primary Care

## 2022-01-21 DIAGNOSIS — M2550 Pain in unspecified joint: Secondary | ICD-10-CM

## 2022-01-21 DIAGNOSIS — M545 Low back pain, unspecified: Secondary | ICD-10-CM

## 2022-01-22 ENCOUNTER — Encounter: Payer: Self-pay | Admitting: Oncology

## 2022-01-22 ENCOUNTER — Inpatient Hospital Stay: Payer: PPO

## 2022-01-22 ENCOUNTER — Other Ambulatory Visit: Payer: Self-pay

## 2022-01-22 ENCOUNTER — Other Ambulatory Visit: Payer: Self-pay | Admitting: Primary Care

## 2022-01-22 VITALS — BP 121/66 | HR 83

## 2022-01-22 DIAGNOSIS — D509 Iron deficiency anemia, unspecified: Secondary | ICD-10-CM

## 2022-01-22 DIAGNOSIS — R7303 Prediabetes: Secondary | ICD-10-CM

## 2022-01-22 MED ORDER — SODIUM CHLORIDE 0.9 % IV SOLN: 200.0000 mg | Freq: Once | INTRAVENOUS | Status: DC

## 2022-01-22 MED ORDER — SODIUM CHLORIDE 0.9 % IV SOLN
Freq: Once | INTRAVENOUS | Status: AC
  Filled 2022-01-22: qty 250

## 2022-01-22 MED ORDER — IRON SUCROSE 20 MG/ML IV SOLN
200.0000 mg | Freq: Once | INTRAVENOUS | Status: AC
  Administered 2022-01-22: 15:00:00 200 mg via INTRAVENOUS
  Filled 2022-01-22: qty 10

## 2022-01-23 NOTE — Telephone Encounter (Signed)
Please advise 

## 2022-01-23 NOTE — Telephone Encounter (Signed)
No premeds given  ?

## 2022-01-26 ENCOUNTER — Ambulatory Visit: Payer: PPO | Admitting: Family Medicine

## 2022-01-28 ENCOUNTER — Other Ambulatory Visit (HOSPITAL_COMMUNITY): Payer: Self-pay | Admitting: *Deleted

## 2022-01-28 ENCOUNTER — Other Ambulatory Visit: Payer: Self-pay | Admitting: Primary Care

## 2022-01-28 DIAGNOSIS — E119 Type 2 diabetes mellitus without complications: Secondary | ICD-10-CM | POA: Diagnosis not present

## 2022-01-28 DIAGNOSIS — K1379 Other lesions of oral mucosa: Secondary | ICD-10-CM

## 2022-01-28 LAB — HM DIABETES EYE EXAM

## 2022-01-28 MED ORDER — TEMAZEPAM 30 MG PO CAPS: ORAL_CAPSULE | ORAL | 0 refills | Status: DC

## 2022-01-28 MED ORDER — VALACYCLOVIR HCL 1 G PO TABS: ORAL_TABLET | ORAL | 0 refills | Status: DC

## 2022-01-29 ENCOUNTER — Other Ambulatory Visit: Payer: Self-pay

## 2022-01-29 ENCOUNTER — Telehealth: Payer: PPO

## 2022-01-29 ENCOUNTER — Inpatient Hospital Stay: Payer: PPO

## 2022-01-29 VITALS — BP 100/61 | HR 91 | Temp 97.1°F | Resp 16

## 2022-01-29 DIAGNOSIS — D509 Iron deficiency anemia, unspecified: Secondary | ICD-10-CM | POA: Diagnosis not present

## 2022-01-29 MED ORDER — SODIUM CHLORIDE 0.9 % IV SOLN
Freq: Once | INTRAVENOUS | Status: AC
  Filled 2022-01-29: qty 250

## 2022-01-29 MED ORDER — SODIUM CHLORIDE 0.9 % IV SOLN: 200.0000 mg | Freq: Once | INTRAVENOUS | Status: DC

## 2022-01-29 MED ORDER — IRON SUCROSE 20 MG/ML IV SOLN
200.0000 mg | Freq: Once | INTRAVENOUS | Status: AC
  Administered 2022-01-29: 200 mg via INTRAVENOUS
  Filled 2022-01-29: qty 10

## 2022-01-29 NOTE — Patient Instructions (Signed)

## 2022-01-30 DIAGNOSIS — K219 Gastro-esophageal reflux disease without esophagitis: Secondary | ICD-10-CM

## 2022-02-01 MED ORDER — PANTOPRAZOLE SODIUM 40 MG PO TBEC: 40.0000 mg | DELAYED_RELEASE_TABLET | Freq: Two times a day (BID) | ORAL | 0 refills | Status: DC

## 2022-02-03 ENCOUNTER — Other Ambulatory Visit: Payer: Self-pay

## 2022-02-03 ENCOUNTER — Telehealth (HOSPITAL_BASED_OUTPATIENT_CLINIC_OR_DEPARTMENT_OTHER): Payer: PPO | Admitting: Psychiatry

## 2022-02-03 DIAGNOSIS — F310 Bipolar disorder, current episode hypomanic: Secondary | ICD-10-CM | POA: Diagnosis not present

## 2022-02-03 DIAGNOSIS — F3130 Bipolar disorder, current episode depressed, mild or moderate severity, unspecified: Secondary | ICD-10-CM

## 2022-02-03 MED ORDER — TEMAZEPAM 30 MG PO CAPS: ORAL_CAPSULE | ORAL | 5 refills | Status: DC

## 2022-02-03 MED ORDER — DULOXETINE HCL 20 MG PO CPEP: 40.0000 mg | ORAL_CAPSULE | Freq: Every day | ORAL | 2 refills | Status: DC

## 2022-02-03 MED ORDER — DESIPRAMINE HCL 25 MG PO TABS: 25.0000 mg | ORAL_TABLET | Freq: Every day | ORAL | 6 refills | Status: DC

## 2022-02-03 MED ORDER — LAMOTRIGINE 150 MG PO TABS: 300.0000 mg | ORAL_TABLET | Freq: Every day | ORAL | 1 refills | Status: DC

## 2022-02-03 MED ORDER — HYDROXYZINE PAMOATE 50 MG PO CAPS: 100.0000 mg | ORAL_CAPSULE | Freq: Every day | ORAL | 3 refills | Status: DC

## 2022-02-03 MED ORDER — TRAZODONE HCL 50 MG PO TABS: 100.0000 mg | ORAL_TABLET | Freq: Every day | ORAL | 7 refills | Status: DC

## 2022-02-03 NOTE — Progress Notes (Addendum)
The most painful.  This  Psychiatric Initial Adult Assessment   Patient Identification: Emily Moon MRN:  [id number] Date of Evaluation:  02/03/2022 Referral Source: From the community Chief Complaint: Feels exhausted   Today the patient seems to be at her baseline.  She continues to be quite scattered.  She jumps from topic to topic.  She does not seem to be all that distressed very much.  The positive things are that she went to the ballet in [place]  and had a reasonably good time.  She went by herself.  The bed uses when she got home she had a lot of hip pain and needs to use the walker a lot.  Her sleep is somewhat erratic.  She typically does not go to bed until 4 or 5:00 in the morning and then sleeps 6 hours.  When she goes to bed even though it is in the morning and 4 that is when she takes her Vistaril trazodone and Restoril Lamictal and her Cymbalta.  Generally the patient is eating okay.  Her son seems to be quite distant from her.  Patient is planning to go on a trip to [place] the state.  Her best friend is going to require their first class.  The patient has not been complaining about her memory but she has complained about that for years.  The patient hopes that she lives still to 61 years old which will be soon.  She says a lot of her family dies before then.  The patient is not suicidal.  She uses no drugs and does not drink any alcohol.  She is going to be seeing a new neurologist which she will bring up issues with her memory as well as any continue treatment for MS.    Patient: Emily Moon  Procedure(s) Performed: * No surgery found *  Anesthes  Patient location:   Post pain:   Post assessment:   Last Vitals:  There were no vitals filed for this visit.  Post vital signs:   Level of consciousness:   Complications: . Associated Signs/Symptoms: Depression Symptoms:  hopelessness, (Hypo) Manic Symptoms:   Anxiety Symptoms:   Psychotic Symptoms:    PTSD Symptoms:   Past Psychiatric History: The patient was seen in a psychiatric office and takes multiple medications she's not been recently hospitalized.  Previous Psychotropic Medications: Yes   Substance Abuse History in the last 12 months:  No.  Consequences of Substance Abuse: Negative  Past Medical History:  Past Medical History:  Diagnosis Date   Arthritis    osteo   Asthma    Bipolar disorder ([place]) 05/21/2014   Cataracts, bilateral    COPD (chronic obstructive pulmonary disease) (HCC)    DDD (degenerative disc disease), cervical    also back   Depression    Diabetes mellitus without complication (HCC)    Dizziness    Positional   Edema    feet/legs   Fibromyalgia syndrome    Fungal infection    Finger nails   GERD (gastroesophageal reflux disease)    Gout    Heart palpitations    Hip dysplasia, congenital 09/15/2013   History of kidney stones    Hypercholesterolemia    IDA (iron deficiency anemia)    Multiple sclerosis (HCC)    weakness   Osteoporosis    osteoarthritis   Pneumonia    PONV (postoperative nausea and vomiting)    no problem after cataract surgery   Prediabetes 07/22/2018  Psoriasis    Shortness of breath dyspnea    wheezing   Sleep apnea 2012   sleep study / slight, no interventions   Urinary frequency    Weight gain 06/21/2014    Past Surgical History:  Procedure Laterality Date   CATARACT EXTRACTION W/PHACO Left 05/21/2015   Procedure: CATARACT EXTRACTION PHACO AND INTRAOCULAR LENS PLACEMENT ([place]);  Surgeon: Birder Robson, MD;  Location: [facility];  Service: Ophthalmology;  Laterality: Left;  [place] 00:35 AP% 22.9 CDE 8.11 fluid pack lot #[id number]   CATARACT EXTRACTION W/PHACO Right 06/04/2015   Procedure: CATARACT EXTRACTION PHACO AND INTRAOCULAR LENS PLACEMENT ([facility]);  Surgeon: Birder Robson, MD;  Location: [facility];  Service: Ophthalmology;  Laterality: Right;  US:00:48 AP%: 10.5 CDE:5.08 Fluid lot #[id number]    CYSTOSCOPY/URETEROSCOPY/HOLMIUM LASER/STENT PLACEMENT Bilateral 09/22/2016   Procedure: CYSTOSCOPY/URETEROSCOPY/HOLMIUM LASER/STENT PLACEMENT;  Surgeon: Hollice Espy, MD;  Location: [facility];  Service: Urology;  Laterality: Bilateral;   EYE SURGERY  2015   tissue biopsy   FOOT SURGERY  2015   JOINT REPLACEMENT Left 2013   hip replacement   LITHOTRIPSY     PTOSIS REPAIR Bilateral 02/18/2016   Procedure: BILATERAL PTOSIS REPAIR UPPER EYELIDS;  Surgeon: Karle Starch, MD;  Location: [facility];  Service: Ophthalmology;  Laterality: Bilateral;  LEAVE PT EARLY AM   thumb surgery Right    TONSILLECTOMY  1973   TOTAL HIP ARTHROPLASTY Right 07/02/2021   Procedure: TOTAL HIP ARTHROPLASTY ANTERIOR APPROACH;  Surgeon: Gaynelle Arabian, MD;  Location: [facility];  Service: Orthopedics;  Laterality: Right;    Family Psychiatric History:   Family History:  Family History  Problem Relation Age of Onset   Cancer Father        Abdomen with mastasis   Cancer Mother    Heart disease Mother    Kidney disease Neg Hx    Bladder Cancer Neg Hx    Prostate cancer Neg Hx    Kidney cancer Neg Hx     Social History:   Social History   Socioeconomic History   Marital status: Divorced    Spouse name: Not on file   Number of children: 1   Years of education: Not on file   Highest education level: Not on file  Occupational History   Occupation: Therapist, 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  Tobacco Use   Smoking status: Former    Packs/day: 1.00    Years: 25.00    Pack years: 25.00    Types: Cigarettes    Quit date: 2019    Years since quitting: 4.2   Smokeless tobacco: Never   Tobacco comments:    occasional use  Vaping Use   Vaping Use: Former  Substance and Sexual Activity   Alcohol use: No    Alcohol/week: 0.0 standard drinks   Drug use: Yes    Types: Methylphenidate, Marijuana    Comment: Delta 8 - once per day   Sexual activity: Never  Other Topics Concern   Not on file   Social History Narrative   Not on file   Social Determinants of Health   Financial Resource Strain: Low Risk    Difficulty of Paying Living Expenses: Not hard at all  Food Insecurity: No Food Insecurity   Worried About Charity fundraiser in the Last Year: Never true   [place] in the Last Year: Never true  Transportation Needs: No Transportation Needs   Lack of Transportation (Medical): No  Lack of Transportation (Non-Medical): No  Physical Activity: Inactive   Days of Exercise per Week: 0 days   Minutes of Exercise per Session: 0 min  Stress: No Stress Concern Present   Feeling of Stress : Not at all  Social Connections: Not on file    Additional Social History:   Allergies:   Allergies  Allergen Reactions   Albuterol Shortness Of Breath and Other (See Comments)    Makes pt feel jittery/ tacycardic   Crestor [Rosuvastatin] Other (See Comments)    Joint pain, muscle pain, and hair loss   Halcion [Triazolam] Other (See Comments)    Dizziness,headaches,bladder problems   Levaquin [Levofloxacin In D5w] Diarrhea and Itching    Shoulder pain   Metformin And Related Rash   Naproxen Sodium     Patient tolerates in small doses. Her reaction is swelling of the lower extremities.    Cefaclor Other (See Comments)    Doesn't remember---unsure if actually allergic    Sulfa Antibiotics Itching    Unsure of reaction possibly itching   Tramadol Itching and Nausea And Vomiting   Zinc Other (See Comments)    constipation  constipation    Aripiprazole Other (See Comments)    Muscle tension/cramping   Diclofenac Sodium Rash    "made very sick"    Metabolic Disorder Labs: Lab Results  Component Value Date   HGBA1C 6.7 (A) 11/04/2021   MPG 142.72 07/02/2021   No results found for: PROLACTIN Lab Results  Component Value Date   CHOL 159 03/10/2021   TRIG 189.0 (H) 03/10/2021   HDL 39.90 03/10/2021   CHOLHDL 4 03/10/2021   VLDL 37.8 03/10/2021   LDLCALC 81  03/10/2021   LDLCALC 223 (H) 11/13/2014     Current Medications: Current Outpatient Medications  Medication Sig Dispense Refill   Azelastine HCl 137 MCG/SPRAY SOLN PLACE 2 SPRAYS INTO BOTH NOSTRILS 2 (TWO) TIMES DAILY 30 mL 5   B Complex-Biotin-FA (BIG 100, BIOTIN, PO) Take by mouth.     cetirizine (ZYRTEC) 10 MG tablet Take 10 mg by mouth daily.     clindamycin (CLEOCIN T) 1 % external solution Apply to scalp once or twice a day prn bumps 30 mL 2   clobetasol (OLUX) 0.05 % topical foam Apply to affected areas at arms, legs, trunk twice a day up to 2 weeks. Avoid applying to face, groin, and axilla. Use as directed. Risk of skin atrophy with long-term use reviewed. 50 g 0   clobetasol (TEMOVATE) 0.05 % external solution Mix clobetasol solution with  CeraVe cream Use twice daily to affected areas.Avoid Face, groin and underarm 50 mL 0   Continuous Blood Gluc Sensor (FREESTYLE LIBRE 14 DAY SENSOR) MISC PLACE INTO SKIN FOR CONTINUOUS CHECK OF BLOOD SUGAR DAILY 6 each 2   desipramine (NORPRAMIN) 25 MG tablet Take 1 tablet (25 mg total) by mouth daily. 30 tablet 6   Dimethyl Fumarate 120 & 240 MG MISC Take by mouth. Take 120 mg twice daily x 7 days then increase to 240 mg twice daily     diphenhydrAMINE (BENADRYL) 2 % cream Apply topically 3 (three) times daily as needed for itching.     Dulaglutide (TRULICITY) 3 AL/9.3XT SOPN Inject 3 mg as directed once a week. For diabetes. 2 mL 0   DULoxetine (CYMBALTA) 20 MG capsule Take 2 capsules (40 mg total) by mouth daily. 180 capsule 2   Evolocumab (REPATHA SURECLICK) 024 MG/ML SOAJ Inject 1 Dose into the skin every 14 (  fourteen) days. 2 mL 3   famotidine (PEPCID) 20 MG tablet Take 1 tablet (20 mg total) by mouth 2 (two) times daily. 180 tablet 3   fenofibrate (TRICOR) 145 MG tablet Take 1 tablet (145 mg total) by mouth daily. For cholesterol. 90 tablet 2   ferrous sulfate 325 (65 FE) MG tablet Take 1 tablet (325 mg total) by mouth 2 (two) times daily  with a meal. 60 tablet 3   fluocinonide (LIDEX) 0.05 % external solution Apply 1 application topically 2 (two) times daily as needed. Apply to scalp twice daily as need for itch 180 mL 3   hydrocortisone 2.5 % cream Apply 1 application topically 2 (two) times daily as needed (underarm/groin rash).     hydrOXYzine (VISTARIL) 50 MG capsule Take 2 capsules (100 mg total) by mouth at bedtime. TAKE 1 CAPSLE BY MOUTH AT NOON, 1 AT 6 PM, AND 1 AS NEEDED 90 capsule 3   lamoTRIgine (LAMICTAL) 150 MG tablet Take 2 tablets (300 mg total) by mouth at bedtime. 180 tablet 1   levalbuterol (XOPENEX HFA) 45 MCG/ACT inhaler INHALE 1 TO 2 PUFFS BY MOUTH EVERY 6 HOURS AS NEEDED FOR WHEEZE 1 each 0   Lysine 500 MG TABS Take 200 mg by mouth daily.     meclizine (ANTIVERT) 25 MG tablet Take 0.5-1 tablets (12.5-25 mg total) by mouth 3 (three) times daily as needed for dizziness (sedation caution). 30 tablet 0   methocarbamol (ROBAXIN) 500 MG tablet TAKE 1-2 TABLETS (500-1,000 MG TOTAL) BY MOUTH AT BEDTIME AS NEEDED FOR MUSCLE SPASMS. 180 tablet 0   metoprolol tartrate (LOPRESSOR) 50 MG tablet Take 1 tablet (50 mg total) by mouth 2 (two) times daily. 180 tablet 3   neomycin-bacitracin-polymyxin (NEOSPORIN) [phone] ointment Apply 1 application topically 4 (four) times daily as needed (for cut/scrapes.).     pantoprazole (PROTONIX) 40 MG tablet Take 1 tablet (40 mg total) by mouth 2 (two) times daily before a meal. For heartburn 180 tablet 0   polyethylene glycol powder (GLYCOLAX/MIRALAX) powder MIX 17 GRAMS (1 CAPFUL) WITH 4-8 OZ OF LIQUID AND TAKE BY MOUTH TWICE DAILY AS NEEDED 527 g 0   polyvinyl alcohol (LIQUIFILM TEARS) 1.4 % ophthalmic solution Place 2 drops into both eyes at bedtime.     temazepam (RESTORIL) 30 MG capsule TAKE ONE TO TWO CAPSULES BY MOUTH NIGHTLY AT BEDTIME 60 capsule 5   traZODone (DESYREL) 50 MG tablet Take 2 tablets (100 mg total) by mouth at bedtime. 60 tablet 7   valACYclovir (VALTREX) 1000 MG  tablet Take 2 tablets by mouth twice daily for 1 day as needed for herpes outbreak. 12 tablet 0   No current facility-administered medications for this visit.    Neurologic: Headache: No Seizure: No Paresthesias:No  Musculoskeletal: Strength & Muscle Tone: within normal limits Gait & Station: normal Patient leans: N/A  Psychiatric Specialty Exam: ROS  Last menstrual period 08/23/2014.There is no height or weight on file to calculate BMI.  General Appearance: Casual  Eye Contact:  Good  Speech:  Clear and Coherent  Volume:  Normal  Mood:  Negative  Affect:  Appropriate  Thought Process:  Goal Directed  Orientation:  NA  Thought Content:  Logical  Suicidal Thoughts:  No  Homicidal Thoughts:  No  Memory:  Negative  Judgement:  Good  Insight:  Good  Psychomotor Activity:  Normal  Concentration:    Recall:    Fund of Knowledge:Good  Language: Good  Akathisia:  No  Handed:  Right  AIMS (if indicated):    Assets:  Desire for Improvement  ADL's:  Intact  Cognition: WNL  Sleep:      3/21/20233:59 PM     Today the patient is doing fairly well.  She is diagnosed with bipolar disorder depressed type.  She will continue taking Lamictal.  She also takes Cymbalta problem also for pain.  The patient is on desipramine as well.  Her second problem is insomnia.  She will continue taking trazodone and Restoril and Vistaril.  It is noted that she takes a high dose of Restoril 30 mg 2 at night.  The patient is not in therapy at this time.  She is very talkative.  This was like a supportive psychotherapy session although it is hard to really interact very much with her.  She does not allow herself to be very vulnerable.  She is very on guard.  Her target symptoms are not clear.  The patient continues to function fairly well despite all of her physical and emotional limitations. Phone visit  spent 30 minutes  patient at home  I called from the center

## 2022-02-04 ENCOUNTER — Other Ambulatory Visit (HOSPITAL_COMMUNITY): Payer: Self-pay

## 2022-02-04 MED ORDER — TEMAZEPAM 30 MG PO CAPS
ORAL_CAPSULE | ORAL | 5 refills | Status: DC
Start: 2022-02-04 — End: 2022-08-04

## 2022-02-05 ENCOUNTER — Other Ambulatory Visit: Payer: Self-pay

## 2022-02-05 ENCOUNTER — Inpatient Hospital Stay: Payer: PPO

## 2022-02-05 VITALS — BP 116/53 | HR 92 | Temp 97.4°F | Resp 20

## 2022-02-05 DIAGNOSIS — R413 Other amnesia: Secondary | ICD-10-CM | POA: Insufficient documentation

## 2022-02-05 DIAGNOSIS — G35 Multiple sclerosis: Secondary | ICD-10-CM | POA: Diagnosis not present

## 2022-02-05 DIAGNOSIS — D509 Iron deficiency anemia, unspecified: Secondary | ICD-10-CM

## 2022-02-05 DIAGNOSIS — R200 Anesthesia of skin: Secondary | ICD-10-CM | POA: Insufficient documentation

## 2022-02-05 DIAGNOSIS — R5383 Other fatigue: Secondary | ICD-10-CM | POA: Diagnosis not present

## 2022-02-05 DIAGNOSIS — G35D Multiple sclerosis, unspecified: Secondary | ICD-10-CM | POA: Insufficient documentation

## 2022-02-05 DIAGNOSIS — Z1331 Encounter for screening for depression: Secondary | ICD-10-CM | POA: Diagnosis not present

## 2022-02-05 MED ORDER — SODIUM CHLORIDE 0.9 % IV SOLN: 200.0000 mg | Freq: Once | INTRAVENOUS | Status: DC

## 2022-02-05 MED ORDER — IRON SUCROSE 20 MG/ML IV SOLN
200.0000 mg | Freq: Once | INTRAVENOUS | Status: AC
  Administered 2022-02-05: 200 mg via INTRAVENOUS
  Filled 2022-02-05: qty 10

## 2022-02-05 MED ORDER — SODIUM CHLORIDE 0.9 % IV SOLN
Freq: Once | INTRAVENOUS | Status: AC
  Filled 2022-02-05: qty 250

## 2022-02-09 ENCOUNTER — Other Ambulatory Visit (HOSPITAL_COMMUNITY): Payer: Self-pay | Admitting: Neurology

## 2022-02-09 ENCOUNTER — Other Ambulatory Visit: Payer: Self-pay | Admitting: Neurology

## 2022-02-09 DIAGNOSIS — G35 Multiple sclerosis: Secondary | ICD-10-CM

## 2022-02-10 ENCOUNTER — Other Ambulatory Visit (HOSPITAL_COMMUNITY): Payer: Self-pay | Admitting: Psychiatry

## 2022-02-12 ENCOUNTER — Ambulatory Visit: Payer: PPO | Admitting: Gastroenterology

## 2022-02-15 ENCOUNTER — Ambulatory Visit
Admission: RE | Admit: 2022-02-15 | Discharge: 2022-02-15 | Disposition: A | Discharge status: Home / Self Care | Payer: PPO | Source: Ambulatory Visit | Attending: Neurology | Admitting: Neurology

## 2022-02-15 DIAGNOSIS — G35 Multiple sclerosis: Secondary | ICD-10-CM | POA: Insufficient documentation

## 2022-02-15 DIAGNOSIS — R42 Dizziness and giddiness: Secondary | ICD-10-CM | POA: Diagnosis not present

## 2022-02-15 DIAGNOSIS — R519 Headache, unspecified: Secondary | ICD-10-CM | POA: Diagnosis not present

## 2022-02-15 IMAGING — MR MR HEAD W/O CM
14 series · 48 of 48 positions shown · non-contrast
Comparison: Brain MRI [DATE]

CLINICAL DATA: Memory problems, worsening over time especially over
the last 6 months some vertigo, periodic headaches

EXAM:
MRI HEAD WITHOUT CONTRAST
TECHNIQUE: Multiplanar, multiecho pulse sequences of the brain and surrounding
structures were obtained without intravenous contrast.

[Series 5: ax dwi_tracew · axial · 3.0mm · 0.65mm/px · z∈[-99,+56]mm · 4 of 48 slices shown]
[im 1/48]
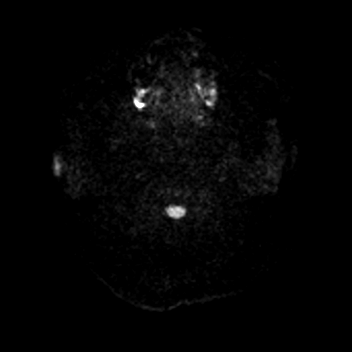
[im 16/48]
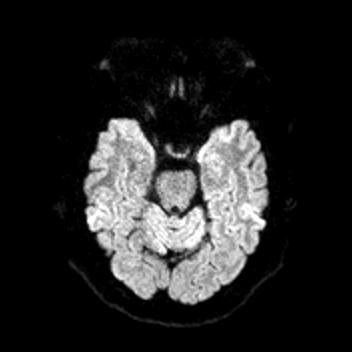
[im 32/48]
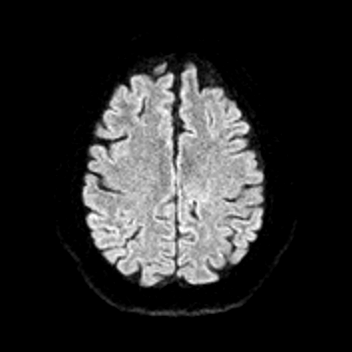
[im 48/48]
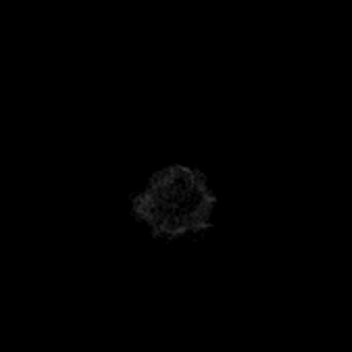

[Series 6: ax dwi_adc · axial · 3.0mm · 0.65mm/px · z∈[-99,+56]mm · 3 of 48 slices shown]
[im 1/48]
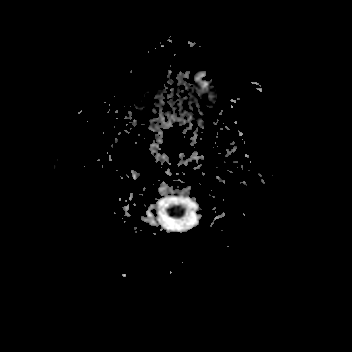
[im 24/48]
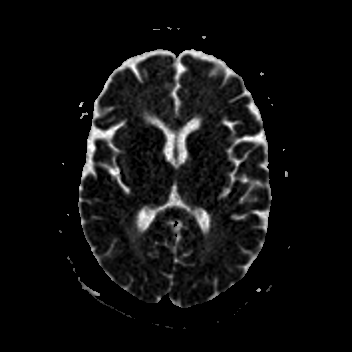
[im 48/48]
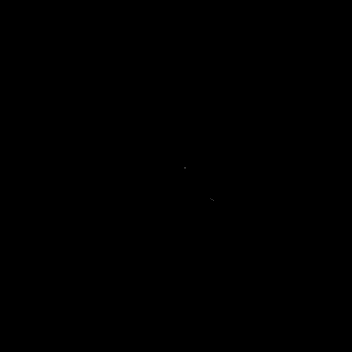

[Series 7: cor dwi_tracew · coronal · 5.0mm · 0.60mm/px · 2 of 38 slices shown]
[im 1/38]
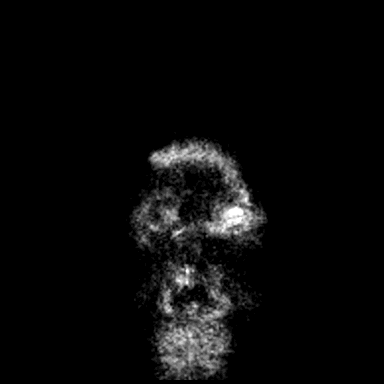
[im 38/38]
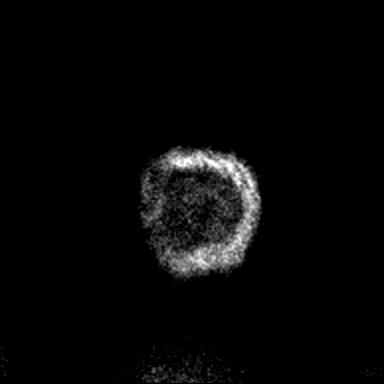

[Series 8: cor dwi_adc · coronal · 5.0mm · 0.60mm/px · 2 of 37 slices shown]
[im 1/37]
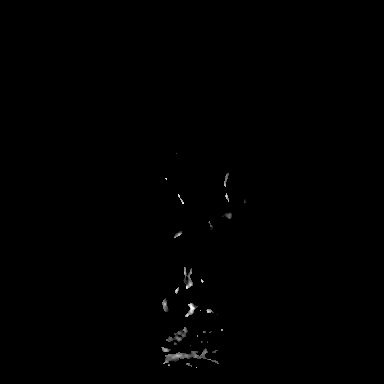
[im 37/37]
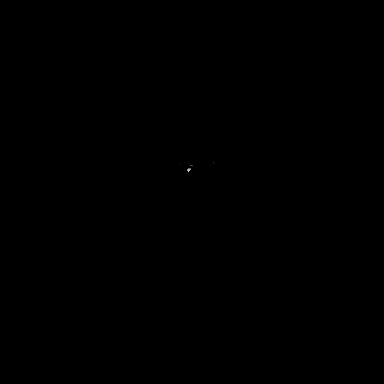

[Series 9: T1 · sagittal · 5.0mm · 0.62mm/px · 1 of 25 slices shown (1 of 2)]
[im 1/25]
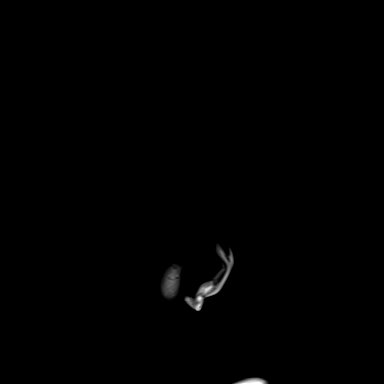

[Series 10: T2 · axial · 5.0mm · 0.53mm/px · 1 of 25 slices shown (1 of 2)]
[im 1/25]
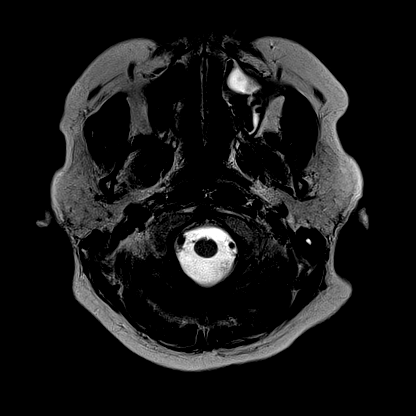

[Series 11: ax swi_mag · axial · 2.0mm · 0.90mm/px · z∈[-100,+58]mm · 5 of 80 slices shown]
[im 1/80]
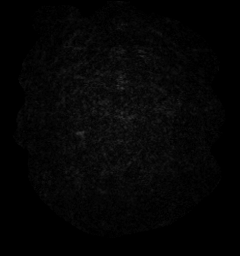
[im 20/80]
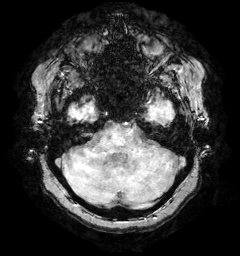
[im 40/80]
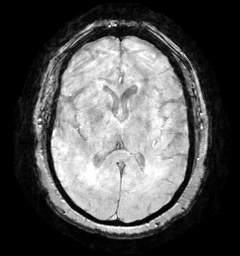
[im 60/80]
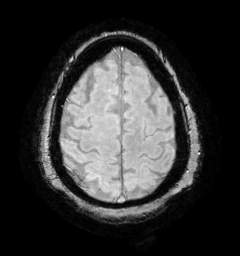
[im 80/80]
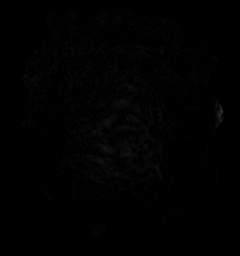

[Series 12: ax swi_pha · axial · 2.0mm · 0.90mm/px · z∈[-100,+58]mm · 5 of 80 slices shown]
[im 1/80]
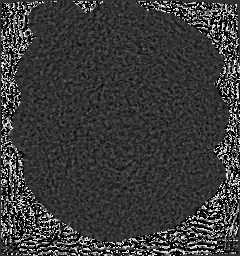
[im 20/80]
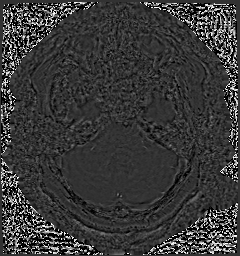
[im 40/80]
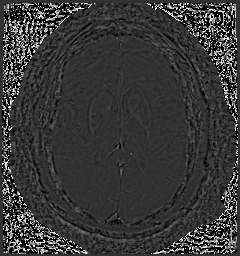
[im 60/80]
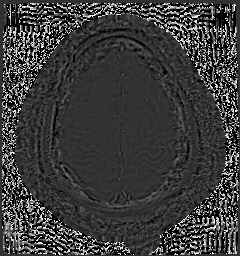
[im 80/80]
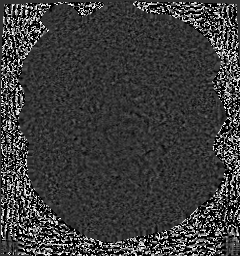

[Series 13: ax swi_swi · axial · 2.0mm · 0.90mm/px · z∈[-100,+58]mm · 5 of 80 slices shown]
[im 1/80]
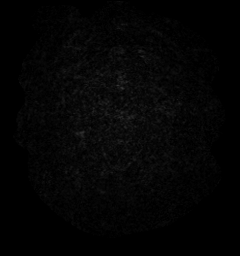
[im 20/80]
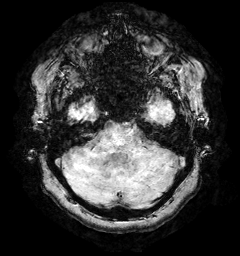
[im 40/80]
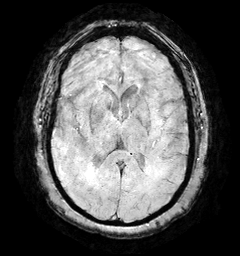
[im 60/80]
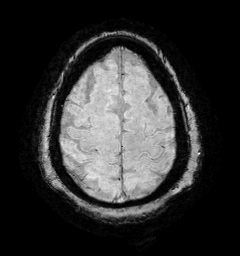
[im 80/80]
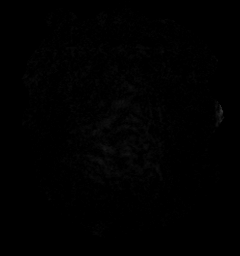

[Series 14: ax swi_swi_mip · axial · 16.0mm · 0.90mm/px · z∈[-93,+51]mm · 4 of 73 slices shown]
[im 1/73]
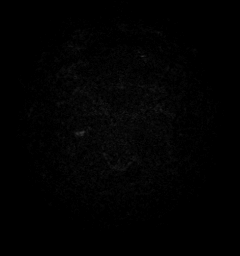
[im 25/73]
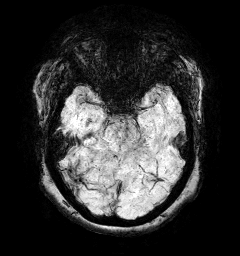
[im 49/73]
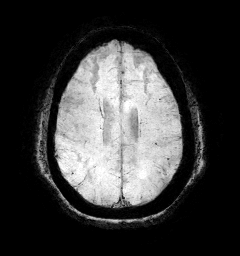
[im 73/73]
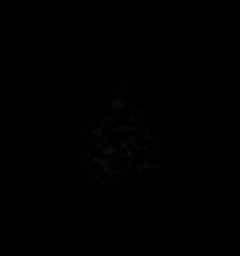

[Series 15: FLAIR · axial · 3.0mm · 0.53mm/px · z∈[-102,+60]mm · 3 of 55 slices shown (1 of 2)]
[im 1/55]
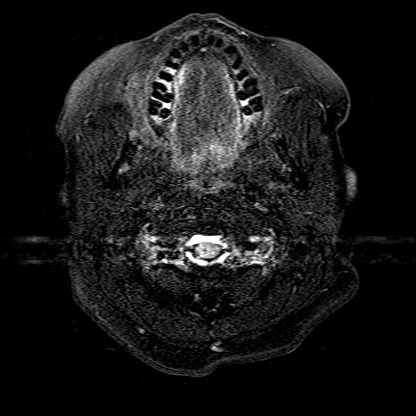
[im 28/55]
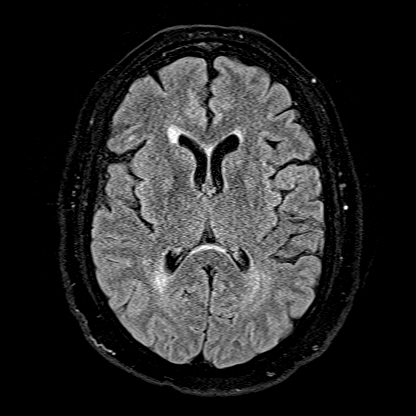
[im 55/55]
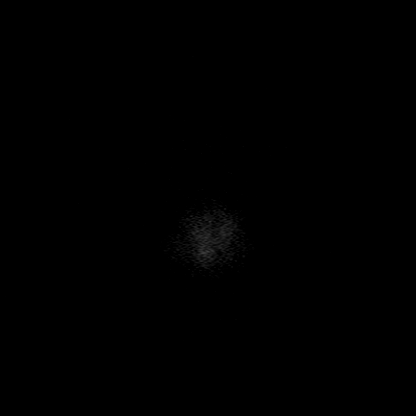

[Series 16: FLAIR · sagittal · 5.0mm · 0.94mm/px · 1 of 25 slices shown (2 of 2)]
[im 1/25]
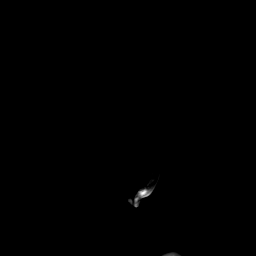

[Series 17: T1 · axial · 1.0mm · 0.98mm/px · z∈[-109,+65]mm · 10 of 176 slices shown (2 of 2)]
[im 1/176]
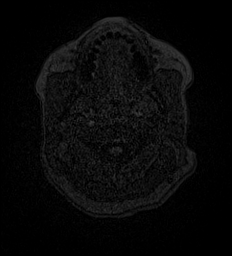
[im 20/176]
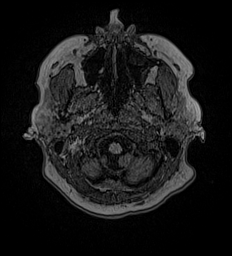
[im 39/176]
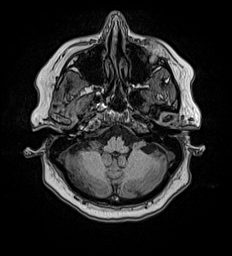
[im 59/176]
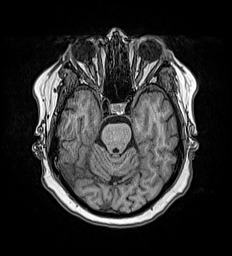
[im 78/176]
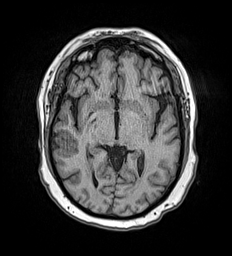
[im 98/176]
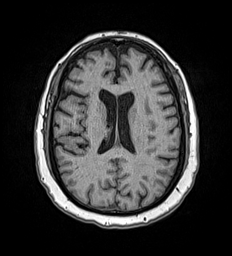
[im 117/176]
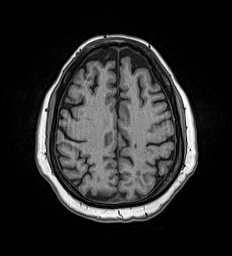
[im 137/176]
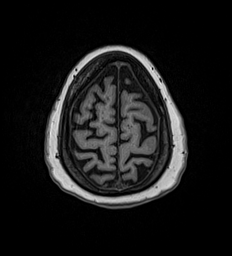
[im 156/176]
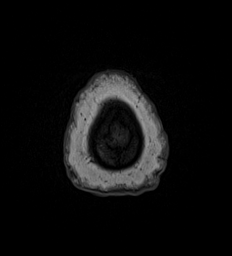
[im 176/176]
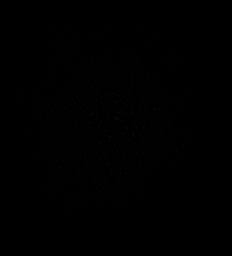

[Series 18: T2 · coronal · 5.0mm · 0.57mm/px · 2 of 29 slices shown (2 of 2)]
[im 1/29]
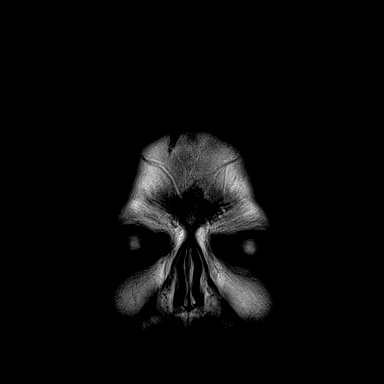
[im 29/29]
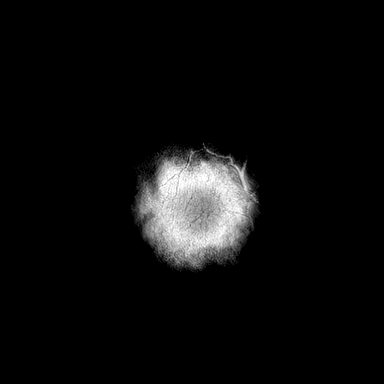

[48 of 48 positions shown; findings below may reference images not displayed]

FINDINGS: Brain: There is no evidence of acute intracranial hemorrhage,
extra-axial fluid collection, or acute infarct.

Background parenchymal volume is normal. There is no hippocampal
atrophy. There are scattered foci of FLAIR signal abnormality in the
juxtacortical and periventricular white matter, some of which are
perpendicular in orientation to the ependymal lining of the lateral
ventricles (for example 16-11, 16-16). Overall, these are unchanged
in appearance and distribution. There is no associated diffusion
restriction.

There is no solid mass lesion. There is no mass effect or midline
shift.

Vascular: Normal flow voids.

Skull and upper cervical spine: Normal marrow signal.

Sinuses/Orbits: There is trace mucosal thickening in the left
maxillary sinus. Bilateral lens implants are in place. The globes
and orbits are otherwise unremarkable. Optic nerves are grossly
unremarkable on these nondedicated sequences.

Other: None.
IMPRESSION: 1. Scattered foci of FLAIR signal abnormality in the supratentorial
white matter are overall not significantly changed since [XH]. These
are nonspecific, but are in a distribution that can be seen with
multiple sclerosis. No diffusion restriction to suggest active
demyelination.
2. No acute intracranial pathology.

## 2022-02-19 ENCOUNTER — Ambulatory Visit: Payer: PPO | Admitting: Dermatology

## 2022-02-19 DIAGNOSIS — D2372 Other benign neoplasm of skin of left lower limb, including hip: Secondary | ICD-10-CM | POA: Diagnosis not present

## 2022-02-19 DIAGNOSIS — L209 Atopic dermatitis, unspecified: Secondary | ICD-10-CM

## 2022-02-19 DIAGNOSIS — D239 Other benign neoplasm of skin, unspecified: Secondary | ICD-10-CM

## 2022-02-19 DIAGNOSIS — L309 Dermatitis, unspecified: Secondary | ICD-10-CM | POA: Diagnosis not present

## 2022-02-19 DIAGNOSIS — R21 Rash and other nonspecific skin eruption: Secondary | ICD-10-CM

## 2022-02-19 NOTE — Patient Instructions (Addendum)

## 2022-02-19 NOTE — Progress Notes (Signed)
? ?  Follow-Up Visit ?  ?Subjective  ?Emily Moon is a 61 y.o. female who presents for the following: Skin Problem (Pt c/o itchy bumps appearing on her face, arms, legs x 2 months, ). Pt report when she uses Clobetasol foam on her skin it makes it she whelp.  ?She has had a rash on her trunk and arms for many weeks without improvement with treatment. ?The patient has spots, moles and lesions to be evaluated, some may be new or changing and the patient has concerns that these could be cancer. ? ?The following portions of the chart were reviewed this encounter and updated as appropriate:  ? Tobacco  Allergies  Meds  Problems  Med Hx  Surg Hx  Fam Hx   ?  ?Review of Systems:  No other skin or systemic complaints except as noted in HPI or Assessment and Plan. ? ?Objective  ?Well appearing patient in no apparent distress; mood and affect are within normal limits. ? ?A focused examination was performed including extremities, including the arms, hands, fingers, and fingernails and the legs, feet, toes, and toenails. Relevant physical exam findings are noted in the Assessment and Plan. ? ?left ankle ?Firm pink/brown papulenodule with dimple sign.  ? ?face,arms,legs ?Erythematous papule  ? ? ? ? ? ? ? ? ? ?Assessment & Plan  ?Dermatofibroma ?left ankle ? ?A dermatofibroma is a benign growth possibly related to trauma, such as an insect bite or inflamed acne-type bump.  Discussed removal (shave vrs excision) with resulting scar and risk of recurrence.  Since not bothersome, will observe for now.  ? ?Rash and other nonspecific skin eruption ?face,arms,legs ? ?Atopic dermatitis (eczema) is a chronic, relapsing, pruritic condition that can significantly affect quality of life. It is often associated with allergic rhinitis and/or asthma and can require treatment with topical medications, phototherapy, or in severe cases biologic injectable medication (Dupixent; Adbry) or Oral JAK inhibitors.  ? ?Skin / nail biopsy -  face,arms,legs ?Type of biopsy: punch   ?Informed consent: discussed and consent obtained   ?Patient was prepped and draped in usual sterile fashion: Area prepped with alcohol. ?Anesthesia: the lesion was anesthetized in a standard fashion   ?Anesthetic:  1% lidocaine w/ epinephrine 1-100,000 buffered w/ 8.4% NaHCO3 ?Punch size:  3 mm ?Suture size:  4-0 ?Suture type: Prolene (polypropylene)   ?Hemostasis achieved with: pressure, aluminum chloride and electrodesiccation   ?Outcome: patient tolerated procedure well   ?Post-procedure details: wound care instructions given   ?Post-procedure details comment:  Ointment and small bandage applied ? ?Specimen 1 - Surgical pathology ?Differential Diagnosis: R/O atopic dermatitis vs other  ? ?Check Margins: No ? ? ?Return in about 1 week (around 02/26/2022) for suture removal, biopsy results . ? ?I, Marye Round, CMA, am acting as scribe for Sarina Ser, MD .  ?Documentation: I have reviewed the above documentation for accuracy and completeness, and I agree with the above. ? ?Sarina Ser, MD ? ?

## 2022-02-20 ENCOUNTER — Encounter: Payer: Self-pay | Admitting: Dermatology

## 2022-02-26 ENCOUNTER — Ambulatory Visit: Payer: PPO | Admitting: Dermatology

## 2022-02-26 DIAGNOSIS — L308 Other specified dermatitis: Secondary | ICD-10-CM

## 2022-02-26 DIAGNOSIS — L820 Inflamed seborrheic keratosis: Secondary | ICD-10-CM | POA: Diagnosis not present

## 2022-02-26 DIAGNOSIS — L309 Dermatitis, unspecified: Secondary | ICD-10-CM

## 2022-02-26 NOTE — Patient Instructions (Addendum)
Start Opzelura cream 1 to 2 times a day to rash on arms and elbows ? ?Call the office in 3 weeks to let us know how you are doing. ? ? ? ?If You Need Anything After Your Visit ? ?If you have any questions or concerns for your doctor, please call our main line at [phone] and press option 4 to reach your doctor's medical assistant. If no one answers, please leave a voicemail as directed and we will return your call as soon as possible. Messages left after 4 pm will be answered the following business day.  ? ?You may also send [place] a message via MyChart. We typically respond to MyChart messages within 1-2 business days. ? ?For prescription refills, please ask your pharmacy to contact our office. Our fax number is [phone]. ? ?If you have an urgent issue when the clinic is closed that cannot wait until the next business day, you can page your doctor at the number below.   ? ?Please note that while we do our best to be available for urgent issues outside of office hours, we are not available 24/7.  ? ?If you have an urgent issue and are unable to reach [place], you may choose to seek medical care at your doctor's office, retail clinic, urgent care center, or emergency room. ? ?If you have a medical emergency, please immediately call 911 or go to the emergency department. ? ?Pager Numbers ? ?- Dr. Nehemiah Massed: [phone] ? ?- Dr. Laurence Ferrari: [phone] ? ?- Dr. Nicole Kindred: [phone] ? ?In the event of inclement weather, please call our main line at [phone] for an update on the status of any delays or closures. ? ?Dermatology Medication Tips: ?Please keep the boxes that topical medications come in in order to help keep track of the instructions about where and how to use these. Pharmacies typically print the medication instructions only on the boxes and not directly on the medication tubes.  ? ?If your medication is too expensive, please contact our office at [phone] option 4 or send [place] a message through [place].   ? ?We are unable to tell what your co-pay for medications will be in advance as this is different depending on your insurance coverage. However, we may be able to find a substitute medication at lower cost or fill out paperwork to get insurance to cover a needed medication.  ? ?If a prior authorization is required to get your medication covered by your insurance company, please allow [place] 1-2 business days to complete this process. ? ?Drug prices often vary depending on where the prescription is filled and some pharmacies may offer cheaper prices. ? ?The website www.goodrx.com contains coupons for medications through different pharmacies. The prices here do not account for what the cost may be with help from insurance (it may be cheaper with your insurance), but the website can give you the price if you did not use any insurance.  ?- You can print the associated coupon and take it with your prescription to the pharmacy.  ?- You may also stop by our office during regular business hours and pick up a GoodRx coupon card.  ?- If you need your prescription sent electronically to a different pharmacy, notify our office through [facility] or by phone at [phone] option 4. ? ? ? ? ?Si Usted Necesita Algo Despu?s de Su Visita ? ?Tambi?n puede enviarnos un mensaje a trav?s de MyChart. Por lo general respondemos a los mensajes de MyChart en el transcurso de  1 a 2 d?as h?biles. ? ?Para renovar recetas, por favor pida a su farmacia que se ponga en contacto con nuestra oficina. Nuestro n?mero de fax es el [phone]. ? ?Si tiene un asunto urgente cuando la cl?nica est? cerrada y que no puede esperar hasta el siguiente d?a h?bil, puede llamar/localizar a su doctor(a) al n?mero que aparece a continuaci?n.  ? ?Por favor, tenga en cuenta que aunque hacemos todo lo posible para estar disponibles para asuntos urgentes fuera del horario de oficina, no estamos disponibles las 24 horas del d?a, los 7 d?as de la semana.   ? ?Si tiene un problema urgente y no puede comunicarse con nosotros, puede optar por buscar atenci?n m?dica  en el consultorio de su doctor(a), en una cl?nica privada, en un centro de atenci?n urgente o en una sala de emergencias. ? ?Si tiene Engineer, maintenance (IT) m?dica, por favor llame inmediatamente al 911 o vaya a la sala de emergencias. ? ?N?meros de b?per ? ?- Dr. Nehemiah Massed: [phone] ? ?- Dra. Moye: [phone] ? ?- Dra. Nicole Kindred: [phone] ? ?En caso de inclemencias del tiempo, por favor llame a nuestra l?nea principal al [phone] para una actualizaci?n sobre el estado de cualquier retraso o cierre. ? ?Consejos para la medicaci?n en dermatolog?a: ?Por favor, guarde las cajas en las que vienen los medicamentos de uso t?pico para ayudarle a seguir las instrucciones sobre d?nde y c?mo usarlos. Las farmacias generalmente imprimen las instrucciones del medicamento s?lo en las cajas y no directamente en los tubos del [place].  ? ?Si su medicamento es muy caro, por favor, p?ngase en contacto con Zigmund Daniel llamando al [phone] y presione la opci?n 4 o env?enos un mensaje a trav?s de MyChart.  ? ?No podemos decirle cu?l ser? su copago por los medicamentos por adelantado ya que esto es diferente dependiendo de la cobertura de su seguro. Sin embargo, es posible que podamos encontrar un medicamento sustituto a Electrical engineer un formulario para que el seguro cubra el medicamento que se considera necesario.  ? ?Si se requiere Ardelia Mems autorizaci?n previa para que su compa??a de seguros [place] su medicamento, por favor perm?tanos de 1 a 2 d?as h?biles para completar este proceso. ? ?Los precios de los medicamentos var?an con frecuencia dependiendo del Environmental consultant de d?nde se surte la receta y alguna farmacias pueden ofrecer precios m?s baratos. ? ?El sitio web www.goodrx.com tiene cupones para medicamentos de Airline pilot. Los precios aqu? no tienen en cuenta lo que podr?a costar con la ayuda del seguro  (puede ser m?s barato con su seguro), pero el sitio web puede darle el precio si no utiliz? ning?n seguro.  ?- Puede imprimir el cup?n correspondiente y llevarlo con su receta a la farmacia.  ?- Tambi?n puede pasar por nuestra oficina durante el horario de atenci?n regular y recoger una tarjeta de cupones de GoodRx.  ?- Si necesita que su receta se env?e electr?nicamente a Chiropodist, informe a nuestra oficina a trav?s de MyChart de [place] o por tel?fono llamando al [phone] y presione la opci?n 4.  ?

## 2022-02-26 NOTE — Progress Notes (Signed)
? ?  Follow-Up Visit ?  ?Subjective  ?Emily Moon is a 61 y.o. 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 ? ?The following portions of the chart were reviewed this encounter and updated as appropriate:  ? Tobacco  Allergies  Meds  Problems  Med Hx  Surg Hx  Fam Hx   ?  ?Review of Systems:  No other skin or systemic complaints except as noted in HPI or Assessment and Plan. ? ?Objective  ?Well appearing patient in no apparent distress; mood and affect are within normal limits. ? ?A focused examination was performed including arms, legs, back. Relevant physical exam findings are noted in the Assessment and Plan. ? ?arms ?Pink oval patches ? ?L post ankle x 1, L calf x 1 (2) ?Stuck on waxy paps with erythema ? ? 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 ?Start Opzelura cr qd/bid to aa rash on arms until clear, then prn flares, samples x 2 given Lot [id number] 05/16/2023 ? ?Encounter for Removal of Sutures ?- Incision site at the L arm is clean, dry and intact ?- Wound cleansed, sutures removed, wound cleansed and steri strips applied.  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 ?- Scars remodel for a full year. ?- Once steri-strips fall off, patient can apply over-the-counter silicone scar cream each night to help with scar remodeling if desired. ?- Patient advised to call with any concerns or if they notice any new or changing lesions.  ? ?Inflamed seborrheic keratosis (2) ?L post ankle x 1, L calf x 1 ? ?Destruction of lesion - L post ankle x 1, L  calf x 1 ?Complexity: simple   ?Destruction method: cryotherapy   ?Informed consent: discussed and consent obtained   ?Timeout:  patient name, date of birth, surgical site, and procedure verified ?Lesion destroyed using liquid nitrogen: Yes   ?Region frozen until ice ball extended beyond lesion: Yes   ?Outcome: patient tolerated procedure well with no complications   ?Post-procedure details: wound care instructions given   ? ?Return if symptoms worsen or fail to improve. ? ?I, Othelia Pulling, RMA, am acting as scribe for Sarina Ser, MD . ?Documentation: I have reviewed the above documentation for accuracy and completeness, and I agree with the above. ? ?Sarina Ser, MD ? ?

## 2022-02-28 ENCOUNTER — Other Ambulatory Visit: Payer: Self-pay | Admitting: Primary Care

## 2022-02-28 DIAGNOSIS — J3089 Other allergic rhinitis: Secondary | ICD-10-CM

## 2022-03-02 ENCOUNTER — Encounter: Payer: Self-pay | Admitting: Dermatology

## 2022-03-03 ENCOUNTER — Encounter: Payer: Self-pay | Admitting: Dermatology

## 2022-03-03 DIAGNOSIS — G4733 Obstructive sleep apnea (adult) (pediatric): Secondary | ICD-10-CM | POA: Diagnosis not present

## 2022-03-05 ENCOUNTER — Ambulatory Visit: Payer: PPO | Admitting: Primary Care

## 2022-03-05 NOTE — Telephone Encounter (Signed)
Noted  

## 2022-03-06 ENCOUNTER — Encounter: Payer: Self-pay | Admitting: Primary Care

## 2022-03-10 DIAGNOSIS — R531 Weakness: Secondary | ICD-10-CM | POA: Diagnosis not present

## 2022-03-10 DIAGNOSIS — G35 Multiple sclerosis: Secondary | ICD-10-CM | POA: Diagnosis not present

## 2022-03-10 DIAGNOSIS — R5383 Other fatigue: Secondary | ICD-10-CM | POA: Diagnosis not present

## 2022-03-10 DIAGNOSIS — R413 Other amnesia: Secondary | ICD-10-CM | POA: Diagnosis not present

## 2022-03-10 DIAGNOSIS — Z1331 Encounter for screening for depression: Secondary | ICD-10-CM | POA: Diagnosis not present

## 2022-03-11 ENCOUNTER — Encounter: Payer: Self-pay | Admitting: Pharmacist

## 2022-03-11 ENCOUNTER — Ambulatory Visit: Payer: PPO | Admitting: Family Medicine

## 2022-03-11 NOTE — Progress Notes (Signed)
[facility])  ?                                          [facility] Quality Pharmacy Team  ?                                      Statin Quality Measure Assessment ?  ? ?03/11/2022 ? ?Emily Moon ?1961/02/24 ?[id number] ? ?Per review of chart and payor information, patient has a diagnosis of diabetes but is not currently filling a statin prescription.  This places patient into the SUPD (Statin Use In Patients with Diabetes) measure for CMS.   ? ?Patient has documented trial of rosuvastatin with reported joint pain and muscle pain, treated with PCSK9 inhibitor.  ? ?The 10-year ASCVD risk score (Arnett DK, et al., 2019) is: 6.5% ?  Values used to calculate the score: ?    Age: 61 years ?    Sex: Female ?    Is Non-Hispanic African American: No ?    Diabetic: Yes ?    Tobacco smoker: No ?    Systolic Blood Pressure: 235 mmHg ?    Is BP treated: Yes ?    HDL Cholesterol: 39.9 mg/dL ?    Total Cholesterol: 159 mg/dL ?03/10/2021 ? ?   ?Component Value Date/Time  ? CHOL 159 03/10/2021 1628  ? TRIG 189.0 (H) 03/10/2021 1628  ? HDL 39.90 03/10/2021 1628  ? CHOLHDL 4 03/10/2021 1628  ? VLDL 37.8 03/10/2021 1628  ? [place] 81 03/10/2021 1628  ? LDLDIRECT 53.0 08/01/2020 1254  ? ? ?Please consider the following recommendation:  ?Code for past statin intolerance or  other exclusions (required annually) ? ?Provider Requirements: ?Associate code during an office visit or telehealth encounter  Drug Induced Myopathy G72.0  ? Myopathy, unspecified G72.9  ? Myositis, unspecified M60.9  ? Rhabdomyolysis M62.82  ? Alcoholic fatty liver T61.4  ? Cirrhosis of liver K74.69  ? Prediabetes R73.03  ? PCOS E28.2  ? Toxic liver disease, unspecified K71.9  ? ?Loretha Brasil, PharmD ?[facility] ?Clinical Pharmacist ?Office: [phone] ? ? ? ? ? ? ?

## 2022-03-18 ENCOUNTER — Other Ambulatory Visit: Payer: Self-pay | Admitting: Primary Care

## 2022-03-18 DIAGNOSIS — E538 Deficiency of other specified B group vitamins: Secondary | ICD-10-CM

## 2022-03-23 ENCOUNTER — Ambulatory Visit: Payer: PPO | Admitting: Urology

## 2022-03-23 ENCOUNTER — Emergency Department: Payer: PPO

## 2022-03-23 ENCOUNTER — Inpatient Hospital Stay
Admission: EM | Admit: 2022-03-23 | Discharge: 2022-03-25 | DRG: 872 | Disposition: A | Discharge status: Home / Self Care | Payer: PPO | Attending: Internal Medicine | Admitting: Internal Medicine

## 2022-03-23 ENCOUNTER — Other Ambulatory Visit: Payer: Self-pay

## 2022-03-23 DIAGNOSIS — N10 Acute tubulo-interstitial nephritis: Secondary | ICD-10-CM | POA: Diagnosis present

## 2022-03-23 DIAGNOSIS — Z8249 Family history of ischemic heart disease and other diseases of the circulatory system: Secondary | ICD-10-CM | POA: Diagnosis not present

## 2022-03-23 DIAGNOSIS — Z888 Allergy status to other drugs, medicaments and biological substances status: Secondary | ICD-10-CM

## 2022-03-23 DIAGNOSIS — Z6834 Body mass index (BMI) 34.0-34.9, adult: Secondary | ICD-10-CM | POA: Diagnosis not present

## 2022-03-23 DIAGNOSIS — Z79899 Other long term (current) drug therapy: Secondary | ICD-10-CM

## 2022-03-23 DIAGNOSIS — E785 Hyperlipidemia, unspecified: Secondary | ICD-10-CM | POA: Diagnosis not present

## 2022-03-23 DIAGNOSIS — R000 Tachycardia, unspecified: Secondary | ICD-10-CM | POA: Diagnosis not present

## 2022-03-23 DIAGNOSIS — F418 Other specified anxiety disorders: Secondary | ICD-10-CM | POA: Diagnosis present

## 2022-03-23 DIAGNOSIS — E119 Type 2 diabetes mellitus without complications: Secondary | ICD-10-CM | POA: Diagnosis not present

## 2022-03-23 DIAGNOSIS — K219 Gastro-esophageal reflux disease without esophagitis: Secondary | ICD-10-CM | POA: Diagnosis present

## 2022-03-23 DIAGNOSIS — B9620 Unspecified Escherichia coli [E. coli] as the cause of diseases classified elsewhere: Secondary | ICD-10-CM | POA: Diagnosis not present

## 2022-03-23 DIAGNOSIS — R21 Rash and other nonspecific skin eruption: Secondary | ICD-10-CM | POA: Diagnosis present

## 2022-03-23 DIAGNOSIS — E7800 Pure hypercholesterolemia, unspecified: Secondary | ICD-10-CM | POA: Diagnosis present

## 2022-03-23 DIAGNOSIS — E669 Obesity, unspecified: Secondary | ICD-10-CM | POA: Diagnosis present

## 2022-03-23 DIAGNOSIS — Z882 Allergy status to sulfonamides status: Secondary | ICD-10-CM | POA: Diagnosis not present

## 2022-03-23 DIAGNOSIS — R160 Hepatomegaly, not elsewhere classified: Secondary | ICD-10-CM | POA: Diagnosis not present

## 2022-03-23 DIAGNOSIS — F419 Anxiety disorder, unspecified: Secondary | ICD-10-CM | POA: Diagnosis not present

## 2022-03-23 DIAGNOSIS — Z9842 Cataract extraction status, left eye: Secondary | ICD-10-CM | POA: Diagnosis not present

## 2022-03-23 DIAGNOSIS — D509 Iron deficiency anemia, unspecified: Secondary | ICD-10-CM | POA: Diagnosis present

## 2022-03-23 DIAGNOSIS — Z961 Presence of intraocular lens: Secondary | ICD-10-CM | POA: Diagnosis present

## 2022-03-23 DIAGNOSIS — F319 Bipolar disorder, unspecified: Secondary | ICD-10-CM | POA: Diagnosis present

## 2022-03-23 DIAGNOSIS — G35 Multiple sclerosis: Secondary | ICD-10-CM | POA: Diagnosis present

## 2022-03-23 DIAGNOSIS — Z885 Allergy status to narcotic agent status: Secondary | ICD-10-CM | POA: Diagnosis not present

## 2022-03-23 DIAGNOSIS — R9431 Abnormal electrocardiogram [ECG] [EKG]: Secondary | ICD-10-CM | POA: Diagnosis not present

## 2022-03-23 DIAGNOSIS — J4489 Other specified chronic obstructive pulmonary disease: Secondary | ICD-10-CM | POA: Diagnosis present

## 2022-03-23 DIAGNOSIS — N12 Tubulo-interstitial nephritis, not specified as acute or chronic: Secondary | ICD-10-CM

## 2022-03-23 DIAGNOSIS — J449 Chronic obstructive pulmonary disease, unspecified: Secondary | ICD-10-CM | POA: Diagnosis present

## 2022-03-23 DIAGNOSIS — M797 Fibromyalgia: Secondary | ICD-10-CM | POA: Diagnosis present

## 2022-03-23 DIAGNOSIS — A4151 Sepsis due to Escherichia coli [E. coli]: Secondary | ICD-10-CM | POA: Diagnosis not present

## 2022-03-23 DIAGNOSIS — G35D Multiple sclerosis, unspecified: Secondary | ICD-10-CM | POA: Diagnosis present

## 2022-03-23 DIAGNOSIS — E1165 Type 2 diabetes mellitus with hyperglycemia: Secondary | ICD-10-CM

## 2022-03-23 DIAGNOSIS — A419 Sepsis, unspecified organism: Secondary | ICD-10-CM | POA: Diagnosis not present

## 2022-03-23 DIAGNOSIS — I10 Essential (primary) hypertension: Secondary | ICD-10-CM | POA: Diagnosis not present

## 2022-03-23 DIAGNOSIS — Z87448 Personal history of other diseases of urinary system: Secondary | ICD-10-CM | POA: Diagnosis not present

## 2022-03-23 DIAGNOSIS — I4581 Long QT syndrome: Secondary | ICD-10-CM | POA: Diagnosis not present

## 2022-03-23 DIAGNOSIS — K5730 Diverticulosis of large intestine without perforation or abscess without bleeding: Secondary | ICD-10-CM | POA: Diagnosis not present

## 2022-03-23 DIAGNOSIS — Z96643 Presence of artificial hip joint, bilateral: Secondary | ICD-10-CM | POA: Diagnosis present

## 2022-03-23 DIAGNOSIS — Z87891 Personal history of nicotine dependence: Secondary | ICD-10-CM | POA: Diagnosis not present

## 2022-03-23 DIAGNOSIS — Z9841 Cataract extraction status, right eye: Secondary | ICD-10-CM

## 2022-03-23 LAB — URINALYSIS, ROUTINE W REFLEX MICROSCOPIC
Bilirubin Urine: NEGATIVE
Glucose, UA: NEGATIVE mg/dL
Hgb urine dipstick: NEGATIVE
Ketones, ur: NEGATIVE mg/dL
Nitrite: NEGATIVE
Protein, ur: 100 mg/dL — AB
Specific Gravity, Urine: 1.016 (ref 1.005–1.030)
WBC, UA: >50 WBC/hpf — ABNORMAL HIGH (ref 0–5)
pH: 5 (ref 5.0–8.0)

## 2022-03-23 LAB — BASIC METABOLIC PANEL
Anion gap: 9 (ref 5–15)
BUN: 15 mg/dL (ref 6–20)
CO2: 23 mmol/L (ref 22–32)
Calcium: 8.6 mg/dL — ABNORMAL LOW (ref 8.9–10.3)
Chloride: 104 mmol/L (ref 98–111)
Creatinine, Ser: 0.96 mg/dL (ref 0.44–1.00)
GFR, Estimated: >60 mL/min (ref >60)
Glucose, Bld: 181 mg/dL — ABNORMAL HIGH (ref 70–99)
Potassium: 3.8 mmol/L (ref 3.5–5.1)
Sodium: 136 mmol/L (ref 135–145)

## 2022-03-23 LAB — BRAIN NATRIURETIC PEPTIDE: B Natriuretic Peptide: 99.7 pg/mL (ref 0.0–100.0)

## 2022-03-23 LAB — CBC
HCT: 38.5 % (ref 36.0–46.0)
Hemoglobin: 12.7 g/dL (ref 12.0–15.0)
MCH: 30.2 pg (ref 26.0–34.0)
MCHC: 33 g/dL (ref 30.0–36.0)
MCV: 91.7 fL (ref 80.0–100.0)
Platelets: 356 10*3/uL (ref 150–400)
RBC: 4.2 MIL/uL (ref 3.87–5.11)
RDW: 15.5 % (ref 11.5–15.5)
WBC: 14.1 10*3/uL — ABNORMAL HIGH (ref 4.0–10.5)
nRBC: 0 % (ref 0.0–0.2)

## 2022-03-23 LAB — LACTIC ACID, PLASMA: Lactic Acid, Venous: 1.9 mmol/L (ref 0.5–1.9)

## 2022-03-23 LAB — CBG MONITORING, ED: Glucose-Capillary: 138 mg/dL — ABNORMAL HIGH (ref 70–99)

## 2022-03-23 LAB — PROCALCITONIN: Procalcitonin: 1.05 ng/mL

## 2022-03-23 LAB — GLUCOSE, CAPILLARY: Glucose-Capillary: 159 mg/dL — ABNORMAL HIGH (ref 70–99)

## 2022-03-23 IMAGING — CT CT ABD-PELV W/ CM
2 of 5 series · 16 of 46 positions shown, 18 images · IV contrast (APPLIED)
Comparison: [DATE]

CLINICAL DATA: Sepsis.  Pyelonephritis.

EXAM:
CT ABDOMEN AND PELVIS WITH CONTRAST
TECHNIQUE: Multidetector CT imaging of the abdomen and pelvis was performed
using the standard protocol following bolus administration of
intravenous contrast.

[Series 2: routine abd/pel with · axial · 0.98mm/px · z∈[-561,-101]mm · 13 of 104 slices shown, 15 images]
[im 6/104  soft-tissue]
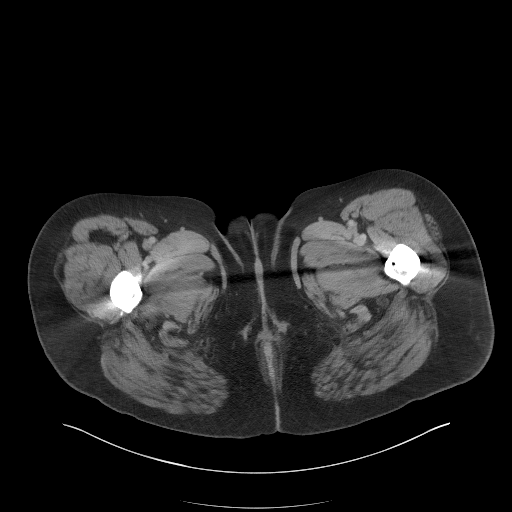
[im 6/104  bone]
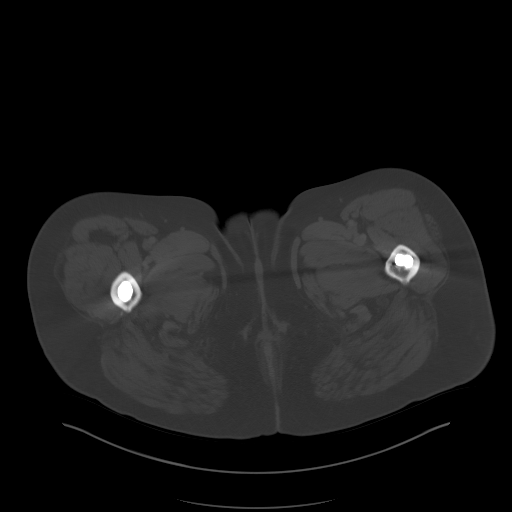
[im 12/104  soft-tissue]
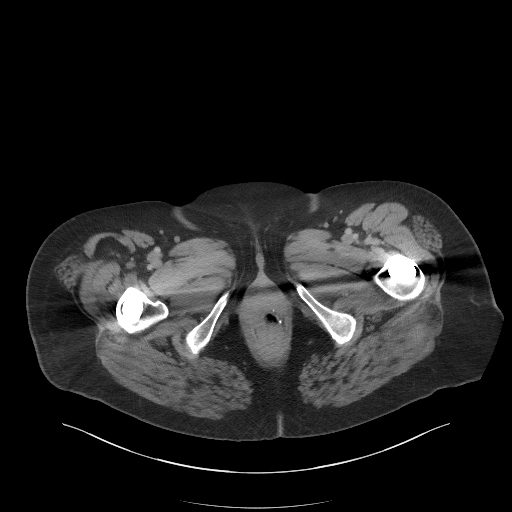
[im 23/104  soft-tissue]
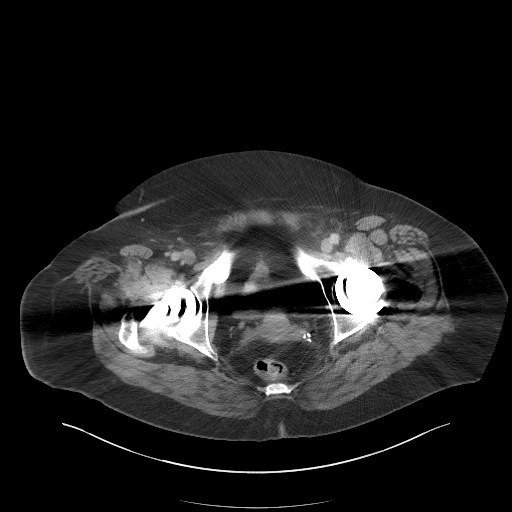
[im 29/104  soft-tissue]
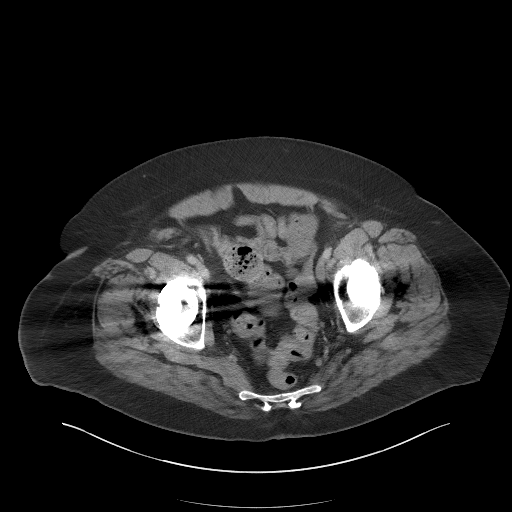
[im 35/104  soft-tissue]
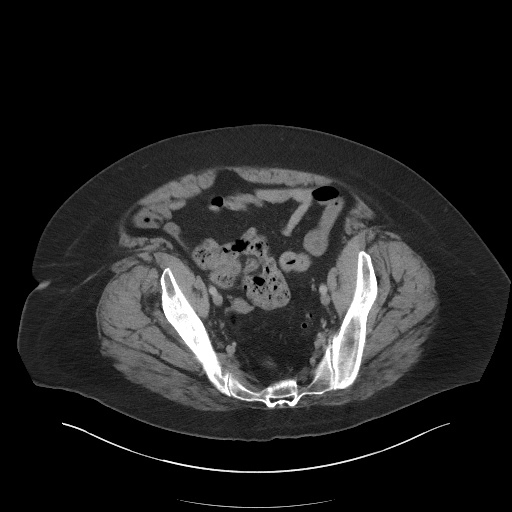
[im 46/104  soft-tissue]
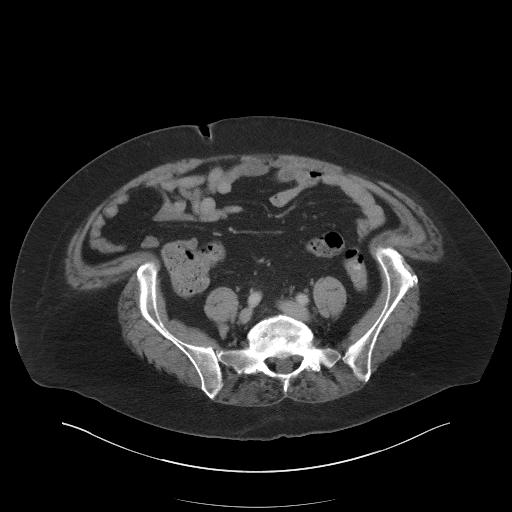
[im 52/104  soft-tissue]
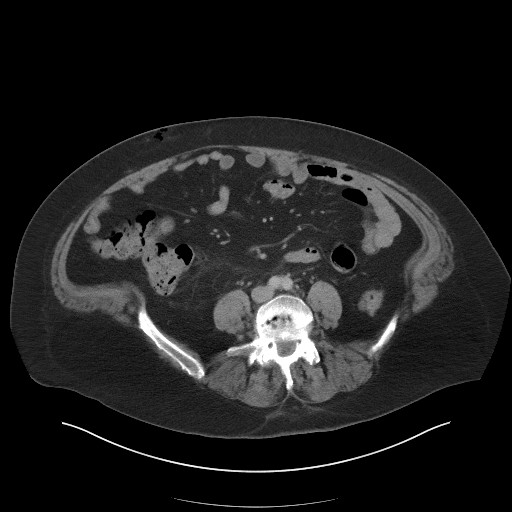
[im 58/104  soft-tissue]
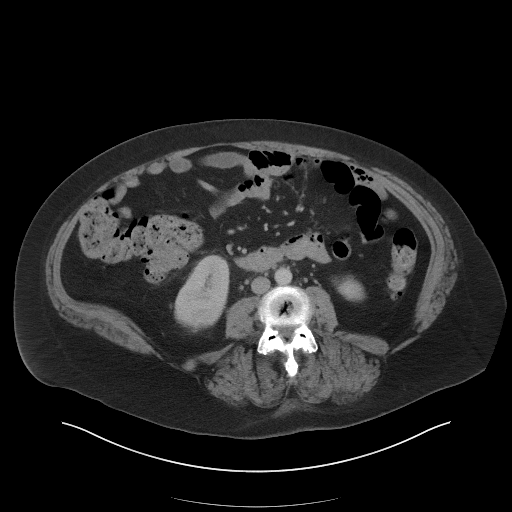
[im 69/104  soft-tissue]
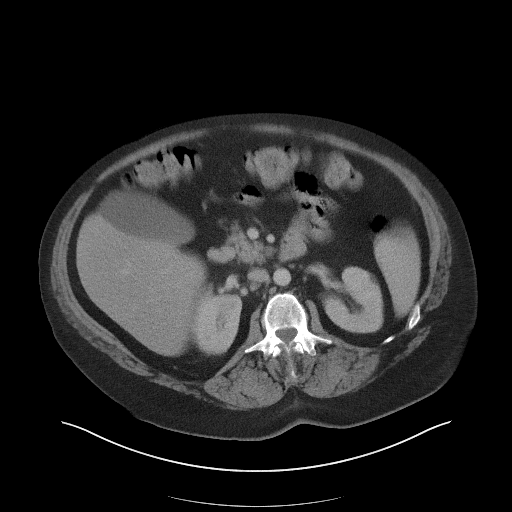
[im 69/104  bone]
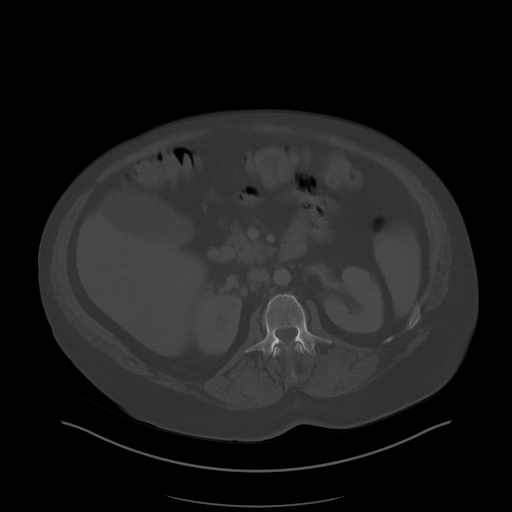
[im 75/104  soft-tissue]
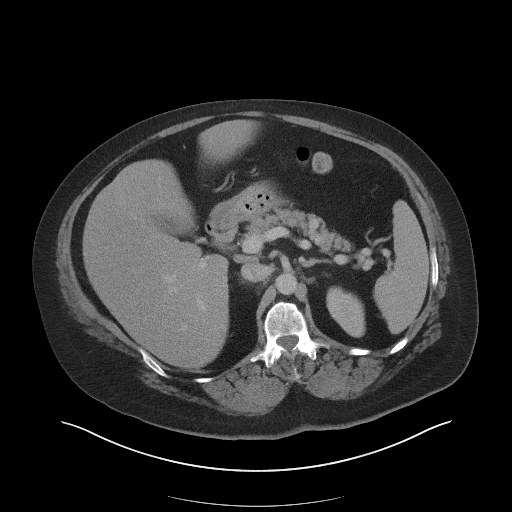
[im 81/104  soft-tissue]
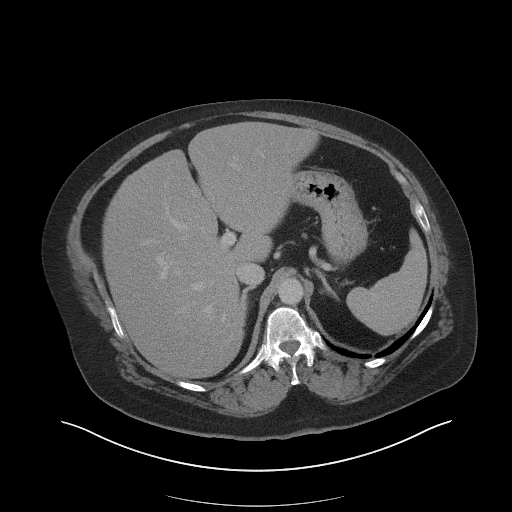
[im 92/104  soft-tissue]
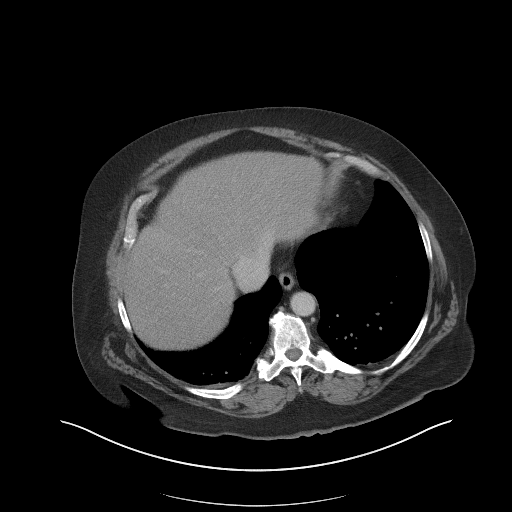
[im 98/104  soft-tissue]
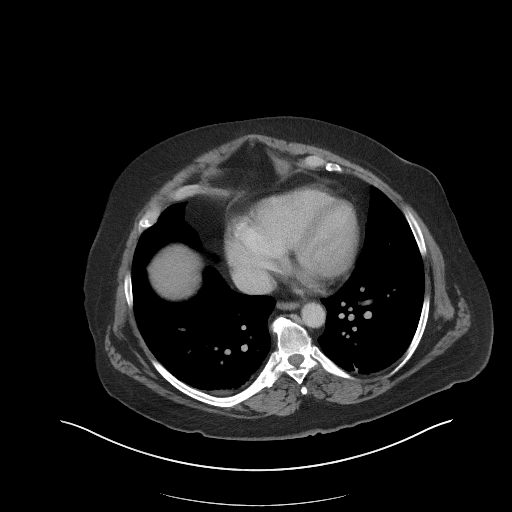

[Series 4: coronal st · coronal · 0.96mm/px · 3 of 114 slices shown]
[im 38/114  soft-tissue]
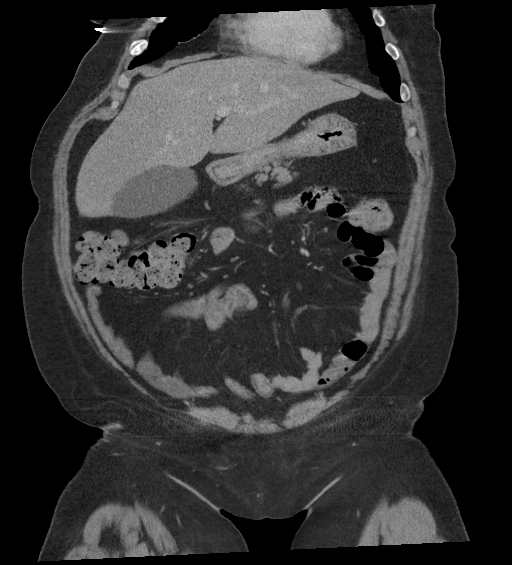
[im 51/114  soft-tissue]
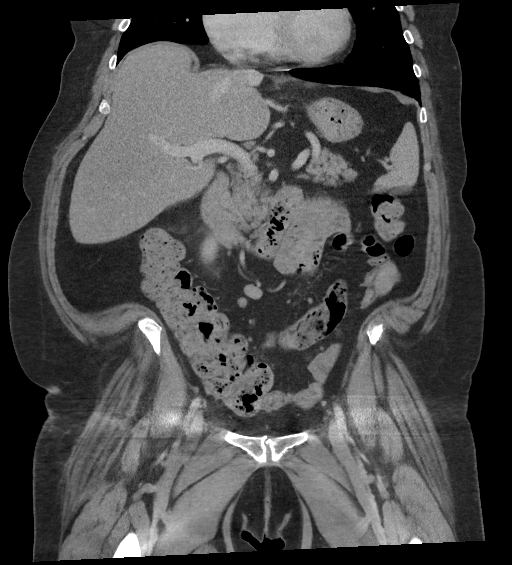
[im 63/114  soft-tissue]
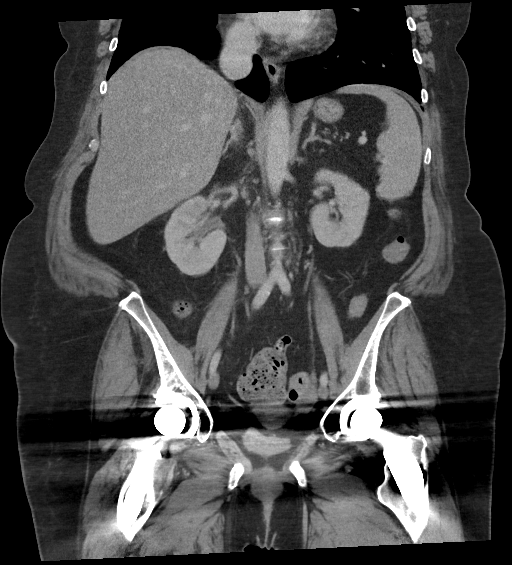

[16 of 46 positions shown; findings below may reference images not displayed]

RADIATION DOSE REDUCTION: This exam was performed according to the
departmental dose-optimization program which includes automated
exposure control, adjustment of the mA and/or kV according to
patient size and/or use of iterative reconstruction technique.

CONTRAST:  100mL OMNIPAQUE IOHEXOL 300 MG/ML  SOLN
FINDINGS: Lower chest: Subsegmental atelectasis noted in the lung bases.

Hepatobiliary: Liver measures 20 cm craniocaudal length, enlarged.
No suspicious focal abnormality within the liver parenchyma. There
is no evidence for gallstones, gallbladder wall thickening, or
pericholecystic fluid. No intrahepatic or extrahepatic biliary
dilation.

Pancreas: No focal mass lesion. No dilatation of the main duct. No
intraparenchymal cyst. No peripancreatic edema.

Spleen: No splenomegaly. No focal mass lesion.

Adrenals/Urinary Tract: No adrenal nodule or mass. Subtle areas of
decreased perfusion are seen in the posterior interpolar right
kidney (image 40/2 and delayed image 17 of series 7) and in the
lower pole right kidney (47/2 and delayed image [DATE]). Left kidney
unremarkable. No evidence for hydroureter. Bladder is decompressed
with portions of the bladder obscured by beam hardening artifact
from bilateral hip replacement.

Stomach/Bowel: Stomach is nondistended. Duodenum is normally
positioned as is the ligament of Treitz. No small bowel wall
thickening. No small bowel dilatation. The terminal ileum is normal.
The appendix is normal. No gross colonic mass. No colonic wall
thickening. Diverticular changes are noted in the left colon without
evidence of diverticulitis.

Vascular/Lymphatic: No abdominal aortic aneurysm. There is no
gastrohepatic or hepatoduodenal ligament lymphadenopathy. No
retroperitoneal or mesenteric lymphadenopathy. No pelvic sidewall
lymphadenopathy.

Reproductive: Uterus is obscured.  There is no adnexal mass.

Other: No intraperitoneal free fluid.

Musculoskeletal: Bilateral hip replacement No worrisome lytic or
sclerotic osseous abnormality.
IMPRESSION: 1. Subtle areas of decreased perfusion in the right kidney
compatible with pyelonephritis. No evidence for renal abscess. No
hydronephrosis or urinary stone disease.
2. Hepatomegaly.
3. Left colonic diverticulosis without diverticulitis.

## 2022-03-23 MED ORDER — POLYVINYL ALCOHOL 1.4 % OP SOLN
2.0000 [drp] | Freq: Every day | OPHTHALMIC | Status: DC
  Filled 2022-03-23: qty 15

## 2022-03-23 MED ORDER — ONDANSETRON HCL 4 MG/2ML IJ SOLN
4.0000 mg | Freq: Once | INTRAMUSCULAR | Status: AC
  Administered 2022-03-23: 4 mg via INTRAVENOUS
  Filled 2022-03-23: qty 2

## 2022-03-23 MED ORDER — VITAMIN D 25 MCG (1000 UNIT) PO TABS
3000.0000 [IU] | ORAL_TABLET | Freq: Every day | ORAL | Status: DC
  Administered 2022-03-23 – 2022-03-24 (×2): 3000 [IU] via ORAL
  Filled 2022-03-23 (×2): qty 3

## 2022-03-23 MED ORDER — INSULIN ASPART 100 UNIT/ML IJ SOLN: 0.0000 [IU] | Freq: Every day | INTRAMUSCULAR | Status: DC

## 2022-03-23 MED ORDER — HYDRALAZINE HCL 20 MG/ML IJ SOLN: 5.0000 mg | INTRAMUSCULAR | Status: DC | PRN

## 2022-03-23 MED ORDER — AZELASTINE HCL 0.1 % NA SOLN
2.0000 | Freq: Two times a day (BID) | NASAL | Status: DC
  Administered 2022-03-25: 2 via NASAL
  Filled 2022-03-23: qty 30

## 2022-03-23 MED ORDER — LORATADINE 10 MG PO TABS
10.0000 mg | ORAL_TABLET | Freq: Every day | ORAL | Status: DC
Start: 2022-03-24 — End: 2022-03-25
  Administered 2022-03-25: 10 mg via ORAL
  Filled 2022-03-23 (×2): qty 1

## 2022-03-23 MED ORDER — IOHEXOL 300 MG/ML  SOLN
100.0000 mL | Freq: Once | INTRAMUSCULAR | Status: AC | PRN
  Administered 2022-03-23: 100 mL via INTRAVENOUS
  Filled 2022-03-23: qty 100

## 2022-03-23 MED ORDER — DIPHENHYDRAMINE HCL 50 MG/ML IJ SOLN: 25.0000 mg | Freq: Three times a day (TID) | INTRAMUSCULAR | Status: DC | PRN

## 2022-03-23 MED ORDER — ACETAMINOPHEN 325 MG PO TABS
650.0000 mg | ORAL_TABLET | Freq: Four times a day (QID) | ORAL | Status: DC | PRN
  Administered 2022-03-23 – 2022-03-25 (×5): 650 mg via ORAL
  Filled 2022-03-23 (×5): qty 2

## 2022-03-23 MED ORDER — INSULIN ASPART 100 UNIT/ML IJ SOLN
0.0000 [IU] | Freq: Three times a day (TID) | INTRAMUSCULAR | Status: DC
  Administered 2022-03-23 – 2022-03-24 (×2): 1 [IU] via SUBCUTANEOUS
  Administered 2022-03-24: 3 [IU] via SUBCUTANEOUS
  Administered 2022-03-24 – 2022-03-25 (×3): 1 [IU] via SUBCUTANEOUS
  Filled 2022-03-23 (×6): qty 1

## 2022-03-23 MED ORDER — DESIPRAMINE HCL 25 MG PO TABS: 25.0000 mg | ORAL_TABLET | Freq: Every day | ORAL | Status: DC

## 2022-03-23 MED ORDER — ENOXAPARIN SODIUM 60 MG/0.6ML IJ SOSY
0.5000 mg/kg | PREFILLED_SYRINGE | INTRAMUSCULAR | Status: DC
  Administered 2022-03-23 – 2022-03-24 (×2): 52.5 mg via SUBCUTANEOUS
  Filled 2022-03-23 (×2): qty 0.6

## 2022-03-23 MED ORDER — TRAZODONE HCL 100 MG PO TABS: 100.0000 mg | ORAL_TABLET | Freq: Every day | ORAL | Status: DC

## 2022-03-23 MED ORDER — LAMOTRIGINE 100 MG PO TABS
300.0000 mg | ORAL_TABLET | Freq: Every day | ORAL | Status: DC
  Administered 2022-03-23 – 2022-03-24 (×2): 300 mg via ORAL
  Filled 2022-03-23 (×2): qty 3

## 2022-03-23 MED ORDER — SODIUM CHLORIDE 0.9 % IV SOLN
2.0000 g | Freq: Once | INTRAVENOUS | Status: AC
  Administered 2022-03-23: 2 g via INTRAVENOUS
  Filled 2022-03-23: qty 20

## 2022-03-23 MED ORDER — IBUPROFEN 100 MG/5ML PO SUSP
100.0000 mg | Freq: Three times a day (TID) | ORAL | Status: DC | PRN
  Administered 2022-03-23: 100 mg via ORAL
  Filled 2022-03-23: qty 5

## 2022-03-23 MED ORDER — ASCORBIC ACID 500 MG PO TABS
500.0000 mg | ORAL_TABLET | Freq: Two times a day (BID) | ORAL | Status: DC
  Administered 2022-03-23 – 2022-03-25 (×4): 500 mg via ORAL
  Filled 2022-03-23 (×4): qty 1

## 2022-03-23 MED ORDER — BIG 100 (BIOTIN) PO TABS: ORAL_TABLET | Freq: Every day | ORAL | Status: DC

## 2022-03-23 MED ORDER — ARFORMOTEROL TARTRATE 15 MCG/2ML IN NEBU
15.0000 ug | INHALATION_SOLUTION | Freq: Two times a day (BID) | RESPIRATORY_TRACT | Status: DC
Start: 2022-03-24 — End: 2022-03-25
  Administered 2022-03-24 – 2022-03-25 (×3): 15 ug via RESPIRATORY_TRACT
  Filled 2022-03-23 (×4): qty 2

## 2022-03-23 MED ORDER — METOPROLOL TARTRATE 50 MG PO TABS
50.0000 mg | ORAL_TABLET | Freq: Two times a day (BID) | ORAL | Status: DC
  Administered 2022-03-23 – 2022-03-25 (×4): 50 mg via ORAL
  Filled 2022-03-23 (×5): qty 1

## 2022-03-23 MED ORDER — DULOXETINE HCL 20 MG PO CPEP
40.0000 mg | ORAL_CAPSULE | Freq: Every day | ORAL | Status: DC
Start: 2022-03-24 — End: 2022-03-23

## 2022-03-23 MED ORDER — SODIUM CHLORIDE 0.9 % IV SOLN
1.0000 g | INTRAVENOUS | Status: DC
  Administered 2022-03-24: 1 g via INTRAVENOUS
  Filled 2022-03-23 (×2): qty 10

## 2022-03-23 MED ORDER — IPRATROPIUM BROMIDE 0.02 % IN SOLN: 2.5000 mL | RESPIRATORY_TRACT | Status: DC | PRN

## 2022-03-23 MED ORDER — HYDROXYZINE HCL 50 MG PO TABS
100.0000 mg | ORAL_TABLET | Freq: Every day | ORAL | Status: DC
  Administered 2022-03-24 (×2): 100 mg via ORAL
  Filled 2022-03-23 (×3): qty 2

## 2022-03-23 MED ORDER — METHOCARBAMOL 500 MG PO TABS
500.0000 mg | ORAL_TABLET | Freq: Every evening | ORAL | Status: DC | PRN
  Administered 2022-03-24 (×2): 1000 mg via ORAL
  Filled 2022-03-23 (×4): qty 2

## 2022-03-23 MED ORDER — SODIUM CHLORIDE 0.9 % IV BOLUS
1000.0000 mL | Freq: Once | INTRAVENOUS | Status: AC
Start: 2022-03-23 — End: 2022-03-23
  Administered 2022-03-23: 1000 mL via INTRAVENOUS

## 2022-03-23 MED ORDER — LYSINE 500 MG PO TABS: 2000.0000 mg | ORAL_TABLET | Freq: Every day | ORAL | Status: DC

## 2022-03-23 MED ORDER — ONDANSETRON HCL 4 MG/2ML IJ SOLN: 4.0000 mg | Freq: Three times a day (TID) | INTRAMUSCULAR | Status: DC | PRN

## 2022-03-23 MED ORDER — FENOFIBRATE 160 MG PO TABS
160.0000 mg | ORAL_TABLET | Freq: Every day | ORAL | Status: DC
Start: 2022-03-24 — End: 2022-03-25
  Administered 2022-03-24 – 2022-03-25 (×2): 160 mg via ORAL
  Filled 2022-03-23 (×2): qty 1

## 2022-03-23 MED ORDER — FERROUS SULFATE 325 (65 FE) MG PO TABS
325.0000 mg | ORAL_TABLET | Freq: Two times a day (BID) | ORAL | Status: DC
  Administered 2022-03-24 – 2022-03-25 (×3): 325 mg via ORAL
  Filled 2022-03-23 (×3): qty 1

## 2022-03-23 MED ORDER — ASPIRIN EC 81 MG PO TBEC
81.0000 mg | DELAYED_RELEASE_TABLET | Freq: Every day | ORAL | Status: DC
  Administered 2022-03-23 – 2022-03-24 (×2): 81 mg via ORAL
  Filled 2022-03-23 (×2): qty 1

## 2022-03-23 MED ORDER — TEMAZEPAM 15 MG PO CAPS: 30.0000 mg | ORAL_CAPSULE | Freq: Every day | ORAL | Status: DC

## 2022-03-23 MED ORDER — DIPHENHYDRAMINE HCL 50 MG/ML IJ SOLN
25.0000 mg | Freq: Once | INTRAMUSCULAR | Status: AC
  Administered 2022-03-23: 25 mg via INTRAVENOUS
  Filled 2022-03-23: qty 1

## 2022-03-23 MED ORDER — PANTOPRAZOLE SODIUM 40 MG PO TBEC
40.0000 mg | DELAYED_RELEASE_TABLET | Freq: Two times a day (BID) | ORAL | Status: DC
  Administered 2022-03-23 – 2022-03-25 (×4): 40 mg via ORAL
  Filled 2022-03-23 (×4): qty 1

## 2022-03-23 MED ORDER — IBUPROFEN 100 MG/5ML PO SUSP
400.0000 mg | Freq: Four times a day (QID) | ORAL | Status: DC | PRN
  Administered 2022-03-23 – 2022-03-24 (×5): 400 mg via ORAL
  Filled 2022-03-23 (×7): qty 20

## 2022-03-23 MED ORDER — LACTATED RINGERS IV BOLUS
1000.0000 mL | Freq: Once | INTRAVENOUS | Status: AC
  Administered 2022-03-23: 1000 mL via INTRAVENOUS

## 2022-03-23 MED ORDER — UMECLIDINIUM BROMIDE 62.5 MCG/ACT IN AEPB
1.0000 | INHALATION_SPRAY | Freq: Every day | RESPIRATORY_TRACT | Status: DC
Start: 2022-03-24 — End: 2022-03-25
  Administered 2022-03-25: 1 via RESPIRATORY_TRACT
  Filled 2022-03-23: qty 7

## 2022-03-23 MED ORDER — SODIUM CHLORIDE 0.9 % IV SOLN: INTRAVENOUS | Status: DC

## 2022-03-23 NOTE — Assessment & Plan Note (Signed)
-   Continue home medications 

## 2022-03-23 NOTE — Assessment & Plan Note (Signed)
Sepsis due to acute pyelonephritis: Patient has sepsis with WBC 14.1, heart rate of 109.  Lactic acid is normal.  Currently hemodynamically stable ? ?-Admitted to telemetry bed as inpatient ?-IV Rocephin ?-Follow-up blood culture and urine culture ?-Check procalcitonin and trend troponin ?-IV fluid: 2 L IV fluid bolus, then 75 cc/h for normal saline ?

## 2022-03-23 NOTE — ED Triage Notes (Signed)
Pt to ED for fever and trouble urinating for a couple days. Pt reports she is peeing but states "I feel like I really have to pee and only 3-4 drips come out". +nausea.  ?

## 2022-03-23 NOTE — Assessment & Plan Note (Signed)
Stable.  Patient is getting evolocumab injection q. 14 days, last dose was 5/1.  Recent MRI for brain on 02/15/2022 showed stable brain lesions.  No acute issues today. ?-Observe closely ?

## 2022-03-23 NOTE — Assessment & Plan Note (Signed)
Recent A1c 6.7.  Well-controlled.  Patient taking Trulicity at home ?-Sliding scale insulin ?

## 2022-03-23 NOTE — ED Provider Notes (Signed)
[facility] Provider Note    Event Date/Time   First MD Initiated Contact with Patient 03/23/22 1304     (approximate)   History   Fever   HPI  [name] is a 61 y.o. female who presents to the ED for evaluation of Fever   I reviewed outpatient neurology visit from 3/23.  History of multiple sclerosis.  Last MRI performed 1 month ago without new structural lesions.  Self reports that she is not currently on any immune modulators.   She presents to the ED for evaluation of 4 days of dysuria and 2 days of fever.  She reports nausea, nonbloody nonbilious emesis and bilateral flank discomfort.  Reports it feels different than her kidney stones that she has had in the past.  Reports concern for UTI, she was admitted for a few days for this 3 months ago.  Reports this feels similar.  No recent antibiotics since then.  Physical Exam   Triage Vital Signs: ED Triage Vitals  Enc Vitals Group     BP 03/23/22 1140 (!) 116/58     Pulse Rate 03/23/22 1140 (!) 107     Resp 03/23/22 1140 18     Temp 03/23/22 1140 100.3 F (37.9 C)     Temp Source 03/23/22 1140 Oral     SpO2 03/23/22 1140 96 %     Weight 03/23/22 1141 234 lb (106.1 kg)     Height 03/23/22 1141 5\' 9"  (1.753 m)     Head Circumference --      Peak Flow --      Pain Score 03/23/22 1145 6     Pain Loc --      Pain Edu? --      Excl. in GC? --     Most recent vital signs: Vitals:   03/23/22 1140 03/23/22 1351  BP: (!) 116/58 (!) 109/56  Pulse: (!) 107 (!) 109  Resp: 18 16  Temp: 100.3 F (37.9 C)   SpO2: 96% 96%    General: Awake, no distress.  CV:  Good peripheral perfusion.  Tachycardic and regular Resp:  Normal effort.  Abd:  No distention.  Suprapubic tenderness without peritoneal features.  Upper abdomen is benign.  Bilateral CVA tenderness is present. MSK:  No deformity noted.  Neuro:  No focal deficits appreciated. Other:     ED Results / Procedures / Treatments    Labs (all labs ordered are listed, but only abnormal results are displayed) Labs Reviewed  CBC - Abnormal; Notable for the following components:      Result Value   WBC 14.1 (*)    All other components within normal limits  BASIC METABOLIC PANEL - Abnormal; Notable for the following components:   Glucose, Bld 181 (*)    Calcium 8.6 (*)    All other components within normal limits  URINALYSIS, ROUTINE W REFLEX MICROSCOPIC - Abnormal; Notable for the following components:   Color, Urine YELLOW (*)    APPearance CLOUDY (*)    Protein, ur 100 (*)    Leukocytes,Ua LARGE (*)    WBC, UA >50 (*)    Bacteria, UA MANY (*)    All other components within normal limits  URINE CULTURE  CULTURE, BLOOD (ROUTINE X 2)  CULTURE, BLOOD (ROUTINE X 2)  BRAIN NATRIURETIC PEPTIDE  LACTIC ACID, PLASMA  LACTIC ACID, PLASMA  PROCALCITONIN    EKG   RADIOLOGY CT abdomen/pelvis reviewed by me without evidence of ureteral  obstruction  Official radiology report(s): CT ABDOMEN PELVIS W CONTRAST  Result Date: 03/23/2022 CLINICAL DATA:  Sepsis.  Pyelonephritis. EXAM: CT ABDOMEN AND PELVIS WITH CONTRAST TECHNIQUE: Multidetector CT imaging of the abdomen and pelvis was performed using the standard protocol following bolus administration of intravenous contrast. RADIATION DOSE REDUCTION: This exam was performed according to the departmental dose-optimization program which includes automated exposure control, adjustment of the mA and/or kV according to patient size and/or use of iterative reconstruction technique. CONTRAST:  OMNIPAQUE IOHEXOL 300 MG/ML  SOLN COMPARISON:  12/24/2021 FINDINGS: Lower chest: Subsegmental atelectasis noted in the lung bases. Hepatobiliary: Liver measures 20 cm craniocaudal length, enlarged. No suspicious focal abnormality within the liver parenchyma. There is no evidence for gallstones, gallbladder wall thickening, or pericholecystic fluid. No intrahepatic or extrahepatic biliary  dilation. Pancreas: No focal mass lesion. No dilatation of the main duct. No intraparenchymal cyst. No peripancreatic edema. Spleen: No splenomegaly. No focal mass lesion. Adrenals/Urinary Tract: No adrenal nodule or mass. Subtle areas of decreased perfusion are seen in the posterior interpolar right kidney (image 40/2 and delayed image 17 of series 7) and in the lower pole right kidney (47/2 and delayed image 26/7). Left kidney unremarkable. No evidence for hydroureter. Bladder is decompressed with portions of the bladder obscured by beam hardening artifact from bilateral hip replacement. Stomach/Bowel: Stomach is nondistended. Duodenum is normally positioned as is the ligament of Treitz. No small bowel wall thickening. No small bowel dilatation. The terminal ileum is normal. The appendix is normal. No gross colonic mass. No colonic wall thickening. Diverticular changes are noted in the left colon without evidence of diverticulitis. Vascular/Lymphatic: No abdominal aortic aneurysm. There is no gastrohepatic or hepatoduodenal ligament lymphadenopathy. No retroperitoneal or mesenteric lymphadenopathy. No pelvic sidewall lymphadenopathy. Reproductive: Uterus is obscured.  There is no adnexal mass. Other: No intraperitoneal free fluid. Musculoskeletal: Bilateral hip replacement No worrisome lytic or sclerotic osseous abnormality. IMPRESSION: 1. Subtle areas of decreased perfusion in the right kidney compatible with pyelonephritis. No evidence for renal abscess. No hydronephrosis or urinary stone disease. 2. Hepatomegaly. 3. Left colonic diverticulosis without diverticulitis. Electronically Signed   By: Kennith Center M.D.   On: 03/23/2022 13:52    PROCEDURES and INTERVENTIONS:  .1-3 Lead EKG Interpretation Performed by: Delton Prairie, MD Authorized by: Delton Prairie, MD     Interpretation: abnormal     ECG rate:  104   ECG rate assessment: tachycardic     Rhythm: sinus tachycardia     Ectopy: none      Conduction: normal   .Critical Care Performed by: Delton Prairie, MD Authorized by: Delton Prairie, MD   Critical care provider statement:    Critical care time (minutes):  30   Critical care time was exclusive of:  Separately billable procedures and treating other patients   Critical care was necessary to treat or prevent imminent or life-threatening deterioration of the following conditions:  Sepsis   Critical care was time spent personally by me on the following activities:  Development of treatment plan with patient or surrogate, discussions with consultants, evaluation of patient's response to treatment, examination of patient, ordering and review of laboratory studies, ordering and review of radiographic studies, ordering and performing treatments and interventions, pulse oximetry, re-evaluation of patient's condition and review of old charts  Medications  0.9 %  sodium chloride infusion (has no administration in time range)  cefTRIAXone (ROCEPHIN) 1 g in sodium chloride 0.9 % 100 mL IVPB (has no administration in time range)  sodium chloride 0.9 % bolus 1,000 mL (has no administration in time range)  insulin aspart (novoLOG) injection 0-9 Units (has no administration in time range)  insulin aspart (novoLOG) injection 0-5 Units (has no administration in time range)  ondansetron (ZOFRAN) injection 4 mg (has no administration in time range)  acetaminophen (TYLENOL) tablet 650 mg (has no administration in time range)  hydrALAZINE (APRESOLINE) injection 5 mg (has no administration in time range)  ipratropium (ATROVENT) nebulizer solution 0.5 mg (has no administration in time range)  enoxaparin (LOVENOX) injection 52.5 mg (has no administration in time range)  lactated ringers bolus 1,000 mL (1,000 mLs Intravenous New Bag/Given 03/23/22 1325)  cefTRIAXone (ROCEPHIN) 2 g in sodium chloride 0.9 % 100 mL IVPB (2 g Intravenous New Bag/Given 03/23/22 1326)  ondansetron (ZOFRAN) injection 4 mg (4 mg  Intravenous Given 03/23/22 1331)  iohexol (OMNIPAQUE) 300 MG/ML solution 100 mL (100 mLs Intravenous Contrast Given 03/23/22 1338)     IMPRESSION / MDM / ASSESSMENT AND PLAN / ED COURSE  I reviewed the triage vital signs and the nursing notes.  61 year old woman with history of MS presents to the ED with evidence of sepsis from pyelonephritis requiring medical admission.  She is tachycardic with low-grade temperatures, remains stable without instability or shock.  Blood work with leukocytosis and intact renal function.  Urine with infectious features and will be sent for culture.  Due to her history of ureteral stones and concerns for acute pyelonephritis, CT abdomen/pelvis obtained and without evidence of urologic obstruction.  Does show radiographic signs of pyelonephritis.  She is meeting sepsis criteria.  We will consult medicine for admission.  Clinical Course as of 03/23/22 1501  Mon Mar 23, 2022  1449 I consulted with hospitalist who agrees to admit [DS]    Clinical Course User Index [DS] Delton Prairie, MD     FINAL CLINICAL IMPRESSION(S) / ED DIAGNOSES   Final diagnoses:  Sepsis without acute organ dysfunction, due to unspecified organism [facility])  Pyelonephritis     Rx / DC Orders   ED Discharge Orders     None        Note:  This document was prepared using Dragon voice recognition software and may include unintentional dictation errors.   Delton Prairie, MD 03/23/22 [phone]

## 2022-03-23 NOTE — Assessment & Plan Note (Signed)
Bipolar and depression with anxiety: Patient is calm now ?-Continue home medications ?

## 2022-03-23 NOTE — Assessment & Plan Note (Signed)
-   As needed Benadryl ?-Discussed with the pharmacist about IV Rocephin use.  Pharmacist suggested to give another dose of Rocephin tomorrow and observe patient to decide if we need to change antibiotics. ?

## 2022-03-23 NOTE — H&P (Addendum)
History and Physical    Emily Moon BJY:[id number] DOB: 16-Apr-1961 DOA: 03/23/2022  Referring MD/NP/PA:   PCP: Doreene Nest, NP   Patient coming from:  The patient is coming from home.  At baseline, pt is independent for most of ADL.        Chief Complaint: Dysuria, bilateral flank pain, fever  HPI: Emily Moon is a 61 y.o. female with medical history significant of kidney stone, pyelonephritis, s/p of lithotripsy, hypertension, hyperlipidemia, diabetes mellitus, COPD, asthma, GERD, depression with anxiety, MS, fibromyalgia, bipolar, psoriasis, who presents with dysuria, bilateral flank pain, fever.  Patient states that she has dysuria, difficulty urinating, pressure feeling while urinating for more than 4 days.  In the past 2 days, she developed fever and chills.  She also complains of bilateral flank pain.  She has nausea, no vomiting, abdominal pain or diarrhea.  She complains of headache.  No chest pain, cough, shortness of breath.  No hematuria.  Patient developed erythematous rash behind her neck and ED. not sure what caused the rash.  Patient received 1 dose of Rocephin.  She states that it may be due to the tapes on her wrist  Data Reviewed and ED Course: pt was found to have WBC 14.1,  lactic acid 1.9, BNP 99.7, positive urinalysis (cloudy appearance, large amount of leukocyte, many bacteria, WBC> 50), GFR> 60, temperature 100.3, blood pressure 109/56, heart rate of 109, RR 16, oxygen saturation 96% on room air.  CT scan of abdomen/pelvis showed right pyelonephritis no hydronephrosis or stone.  Patient is admitted to telemetry bed as inpatient  EKG: Not done in ED, will get one.   Review of Systems:   General: no fevers, chills, no body weight gain, fatigue HEENT: no blurry vision, hearing changes or sore throat Respiratory: no dyspnea, coughing, wheezing CV: no chest pain, no palpitations GI: has nausea, no vomiting, abdominal pain, diarrhea, constipation GU:  has dysuria, no burning on urination, increased urinary frequency, hematuria  Ext: no leg edema Neuro: no unilateral weakness, numbness, or tingling, no vision change or hearing loss Skin: has rash MSK: No muscle spasm, no deformity, no limitation of range of movement in spin Heme: No easy bruising.  Travel history: No recent long distant travel.   Allergy:  Allergies  Allergen Reactions   Albuterol Shortness Of Breath and Other (See Comments)    Makes pt feel jittery/ tacycardic   Crestor [Rosuvastatin] Other (See Comments)    Joint pain, muscle pain, and hair loss   Halcion [Triazolam] Other (See Comments)    Dizziness,headaches,bladder problems   Levaquin [Levofloxacin In D5w] Diarrhea and Itching    Shoulder pain   Metformin And Related Rash   Naproxen Sodium     Patient tolerates in small doses. Her reaction is swelling of the lower extremities.    Cefaclor Other (See Comments)    Doesn't remember---unsure if actually allergic    Sulfa Antibiotics Itching    Unsure of reaction possibly itching   Tramadol Itching and Nausea And Vomiting   Zinc Other (See Comments)    constipation  constipation    Aripiprazole Other (See Comments)    Muscle tension/cramping   Diclofenac Sodium Rash    "made very sick"    Past Medical History:  Diagnosis Date   Arthritis    osteo   Asthma    Bipolar disorder (HCC) 05/21/2014   Cataracts, bilateral    COPD (chronic obstructive pulmonary disease) (HCC)    DDD (degenerative disc  disease), cervical    also back   Depression    Diabetes mellitus without complication (HCC)    Dizziness    Positional   Edema    feet/legs   Fibromyalgia syndrome    Fungal infection    Finger nails   GERD (gastroesophageal reflux disease)    Gout    Heart palpitations    Hip dysplasia, 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   History of kidney stones    Hypercholesterolemia    IDA (iron deficiency anemia)    Multiple sclerosis (HCC)    weakness   Osteoporosis     osteoarthritis   Pneumonia    PONV (postoperative nausea and vomiting)    no problem after cataract surgery   Prediabetes 07/22/2018   Psoriasis    Shortness of breath dyspnea    wheezing   Sleep apnea 2012   sleep study / slight, no interventions   Urinary frequency    Weight gain 06/21/2014    Past Surgical History:  Procedure Laterality Date   CATARACT EXTRACTION W/PHACO Left 05/21/2015   Procedure: CATARACT EXTRACTION PHACO AND INTRAOCULAR LENS PLACEMENT (IOC);  Surgeon: Galen Manila, MD;  Location: [facility];  Service: Ophthalmology;  Laterality: Left;  [place] 00:35 AP% 22.9 CDE 8.11 fluid pack lot #[id number]   CATARACT EXTRACTION W/PHACO Right 06/04/2015   Procedure: CATARACT EXTRACTION PHACO AND INTRAOCULAR LENS PLACEMENT (IOC);  Surgeon: Galen Manila, MD;  Location: [facility];  Service: Ophthalmology;  Laterality: Right;  US:00:48 AP%: 10.5 CDE:5.08 Fluid lot #[id number]   CYSTOSCOPY/URETEROSCOPY/HOLMIUM LASER/STENT PLACEMENT Bilateral 09/22/2016   Procedure: CYSTOSCOPY/URETEROSCOPY/HOLMIUM LASER/STENT PLACEMENT;  Surgeon: Vanna Scotland, MD;  Location: [facility];  Service: Urology;  Laterality: Bilateral;   EYE SURGERY  2015   tissue biopsy   FOOT SURGERY  2015   JOINT REPLACEMENT Left 2013   hip replacement   LITHOTRIPSY     PTOSIS REPAIR Bilateral 02/18/2016   Procedure: BILATERAL PTOSIS REPAIR UPPER EYELIDS;  Surgeon: Imagene Riches, MD;  Location[facility];  Service: Ophthalmology;  Laterality: Bilateral;  LEAVE PT EARLY AM   thumb surgery Right    TONSILLECTOMY  1973   TOTAL HIP ARTHROPLASTY Right 07/02/2021   Procedure: TOTAL HIP ARTHROPLASTY ANTERIOR APPROACH;  Surgeon: Ollen Gross, MD;  Location: [facility];  Service: Orthopedics;  Laterality: Right;    Social History:  reports that she quit smoking about 4 years ago. Her smoking use included cigarettes. She has a 25.00 pack-year smoking history. She has never used smokeless tobacco. She reports current  drug use. Drugs: Methylphenidate and Marijuana. She reports that she does not drink alcohol.  Family History:  Family History  Problem Relation Age of Onset   Cancer Father        Abdomen with mastasis   Cancer Mother    Heart disease Mother    Kidney disease Neg Hx    Bladder Cancer Neg Hx    Prostate cancer Neg Hx    Kidney cancer Neg Hx      Prior to Admission medications   Medication Sig Start Date End Date Taking? Authorizing Provider  Azelastine HCl 137 MCG/SPRAY SOLN PLACE 2 SPRAYS INTO BOTH NOSTRILS 2 (TWO) TIMES DAILY 03/01/22   Doreene Nest, NP  B Complex-Biotin-FA (BIG 100, BIOTIN, PO) Take by mouth.    [provider]  cetirizine (ZYRTEC) 10 MG tablet Take 10 mg by mouth daily.    [provider]  clindamycin (CLEOCIN T) 1 % external solution Apply to scalp once or  twice a day prn bumps 11/18/21   Moye, [place], MD  clobetasol (OLUX) 0.05 % topical foam Apply to affected areas at arms, legs, trunk twice a day up to 2 weeks. Avoid applying to face, groin, and axilla. Use as directed. Risk of skin atrophy with long-term use reviewed. 09/09/21   Moye, [place], MD  clobetasol (TEMOVATE) 0.05 % external solution Mix clobetasol solution with  CeraVe cream Use twice daily to affected areas.Avoid Face, groin and underarm 02/22/20   Moye, [place], MD  Continuous Blood Gluc Sensor (FREESTYLE LIBRE 14 DAY SENSOR) MISC PLACE INTO SKIN FOR CONTINUOUS CHECK OF BLOOD SUGAR DAILY 01/22/22   Doreene Nest, NP  desipramine (NORPRAMIN) 25 MG tablet Take 1 tablet (25 mg total) by mouth daily. 02/03/22   Plovsky, Earvin Hansen, MD  Dimethyl Fumarate 120 & 240 MG MISC Take by mouth. Take 120 mg twice daily x 7 days then increase to 240 mg twice daily    [provider]  diphenhydrAMINE (BENADRYL) 2 % cream Apply topically 3 (three) times daily as needed for itching.    [provider]  Dulaglutide (TRULICITY) 3 MG/0.5ML SOPN Inject 3 mg as directed once a week. For  diabetes. 09/02/21   Doreene Nest, NP  DULoxetine (CYMBALTA) 20 MG capsule Take 2 capsules (40 mg total) by mouth daily. 02/03/22   Plovsky, Earvin Hansen, MD  Evolocumab (REPATHA SURECLICK) 140 MG/ML SOAJ Inject 1 Dose into the skin every 14 (fourteen) days. 05/13/21   Debbe Odea, MD  famotidine (PEPCID) 20 MG tablet Take 1 tablet (20 mg total) by mouth 2 (two) times daily. 06/04/21   Doreene Nest, NP  fenofibrate (TRICOR) 145 MG tablet Take 1 tablet (145 mg total) by mouth daily. For cholesterol. 01/20/22   Debbe Odea, MD  ferrous sulfate 325 (65 FE) MG tablet Take 1 tablet (325 mg total) by mouth 2 (two) times daily with a meal. 12/27/21   Danford, Earl Lites, MD  fluocinonide (LIDEX) 0.05 % external solution Apply 1 application topically 2 (two) times daily as needed. Apply to scalp twice daily as need for itch 02/22/20   Moye, [place], MD  hydrocortisone 2.5 % cream Apply 1 application topically 2 (two) times daily as needed (underarm/groin rash). 01/11/20   [provider]  hydrOXYzine (VISTARIL) 50 MG capsule Take 2 capsules (100 mg total) by mouth at bedtime. TAKE 1 CAPSLE BY MOUTH AT NOON, 1 AT 6 PM, AND 1 AS NEEDED 02/03/22   Archer Asa, MD  lamoTRIgine (LAMICTAL) 150 MG tablet Take 2 tablets (300 mg total) by mouth at bedtime. 02/03/22   Plovsky, Earvin Hansen, MD  levalbuterol (XOPENEX HFA) 45 MCG/ACT inhaler INHALE 1 TO 2 PUFFS BY MOUTH EVERY 6 HOURS AS NEEDED FOR WHEEZE 11/20/20   Doreene Nest, NP  Lysine 500 MG TABS Take 200 mg by mouth daily.    [provider]  meclizine (ANTIVERT) 25 MG tablet Take 0.5-1 tablets (12.5-25 mg total) by mouth 3 (three) times daily as needed for dizziness (sedation caution). 03/10/21   Joaquim Nam, MD  methocarbamol (ROBAXIN) 500 MG tablet TAKE 1-2 TABLETS (500-1,000 MG TOTAL) BY MOUTH AT BEDTIME AS NEEDED FOR MUSCLE SPASMS. 01/21/22   Doreene Nest, NP  metoprolol tartrate (LOPRESSOR) 50 MG tablet Take 1 tablet  (50 mg total) by mouth 2 (two) times daily. 08/22/21 12/29/21  Furth, Cadence H, PA-C  neomycin-bacitracin-polymyxin (NEOSPORIN) [phone] ointment Apply 1 application topically 4 (four) times daily as needed (for cut/scrapes.).  [provider]  pantoprazole (PROTONIX) 40 MG tablet Take 1 tablet (40 mg total) by mouth 2 (two) times daily before a meal. For heartburn 02/01/22   Doreene Nest, NP  polyethylene glycol powder (GLYCOLAX/MIRALAX) powder MIX 17 GRAMS (1 CAPFUL) WITH 4-8 OZ OF LIQUID AND TAKE BY MOUTH TWICE DAILY AS NEEDED 11/24/17   Doreene Nest, NP  polyvinyl alcohol (LIQUIFILM TEARS) 1.4 % ophthalmic solution Place 2 drops into both eyes at bedtime.    [provider]  temazepam (RESTORIL) 30 MG capsule TAKE ONE TO TWO CAPSULES BY MOUTH NIGHTLY AT BEDTIME 02/04/22   Plovsky, Earvin Hansen, MD  traZODone (DESYREL) 50 MG tablet Take 2 tablets (100 mg total) by mouth at bedtime. 02/03/22   Plovsky, Earvin Hansen, MD  valACYclovir (VALTREX) 1000 MG tablet Take 2 tablets by mouth twice daily for 1 day as needed for herpes outbreak. 01/28/22   Doreene Nest, NP    Physical Exam: Vitals:   03/23/22 1141 03/23/22 1351 03/23/22 1659 03/23/22 1830  BP:  (!) 109/56  135/67  Pulse:  (!) 109  (!) 124  Resp:  16  (!) 22  Temp:   (!) 103.1 F (39.5 C) (!) 102.9 F (39.4 C)  TempSrc:   Oral Oral  SpO2:  96%  95%  Weight: 106.1 kg     Height: 5\' 9"  (1.753 m)      General: Not in acute distress HEENT:       Eyes: PERRL, EOMI, no scleral icterus.       ENT: No discharge from the ears and nose, no pharynx injection, no tonsillar enlargement.        Neck: No JVD, no bruit, no mass felt. Heme: No neck lymph node enlargement. Cardiac: S1/S2, RRR, No murmurs, No gallops or rubs. Respiratory: No rales, wheezing, rhonchi or rubs. GI: Soft, nondistended, nontender, no rebound pain, no organomegaly, BS present. GU: No hematuria. Has right CVA tenderness Ext: No pitting leg edema  bilaterally. 1+DP/PT pulse bilaterally. Musculoskeletal: No joint deformities, No joint redness or warmth, no limitation of ROM in spin. Skin: has erythematous rashes behind her neck, no itchy Neuro: Alert, oriented X3, cranial nerves II-XII grossly intact, moves all extremities normally.  Psych: Patient is not psychotic, no suicidal or hemocidal ideation.  Labs on Admission: I have personally reviewed following labs and imaging studies  CBC: Recent Labs  Lab 03/23/22 1148  WBC 14.1*  HGB 12.7  HCT 38.5  MCV 91.7  PLT 356   Basic Metabolic Panel: Recent Labs  Lab 03/23/22 1148  NA 136  K 3.8  CL 104  CO2 23  GLUCOSE 181*  BUN 15  CREATININE 0.96  CALCIUM 8.6*   GFR: Estimated Creatinine Clearance: 81.9 mL/min (by C-G formula based on SCr of 0.96 mg/dL). Liver Function Tests: No results for input(s): AST, ALT, ALKPHOS, BILITOT, PROT, ALBUMIN in the last 168 hours. No results for input(s): LIPASE, AMYLASE in the last 168 hours. No results for input(s): AMMONIA in the last 168 hours. Coagulation Profile: No results for input(s): INR, PROTIME in the last 168 hours. Cardiac Enzymes: No results for input(s): CKTOTAL, CKMB, CKMBINDEX, TROPONINI in the last 168 hours. BNP (last 3 results) No results for input(s): PROBNP in the last 8760 hours. HbA1C: No results for input(s): HGBA1C in the last 72 hours. CBG: Recent Labs  Lab 03/23/22 1653  GLUCAP 138*   Lipid Profile: No results for input(s): CHOL, HDL, LDLCALC, TRIG, CHOLHDL, LDLDIRECT in the last 72  hours. Thyroid Function Tests: No results for input(s): TSH, T4TOTAL, FREET4, T3FREE, THYROIDAB in the last 72 hours. Anemia Panel: No results for input(s): VITAMINB12, FOLATE, FERRITIN, TIBC, IRON, RETICCTPCT in the last 72 hours. Urine analysis:    Component Value Date/Time   COLORURINE YELLOW (A) 03/23/2022 1148   APPEARANCEUR CLOUDY (A) 03/23/2022 1148   APPEARANCEUR Clear 09/12/2018 0842   LABSPEC 1.016  03/23/2022 1148   PHURINE 5.0 03/23/2022 1148   GLUCOSEU NEGATIVE 03/23/2022 1148   HGBUR NEGATIVE 03/23/2022 1148   HGBUR moderate 11/13/2010 0945   BILIRUBINUR NEGATIVE 03/23/2022 1148   BILIRUBINUR 1+ 09/19/2021 1041   BILIRUBINUR Negative 09/12/2018 0842   KETONESUR NEGATIVE 03/23/2022 1148   PROTEINUR 100 (A) 03/23/2022 1148   UROBILINOGEN 1.0 09/19/2021 1041   UROBILINOGEN 0.2 11/13/2010 0945   NITRITE NEGATIVE 03/23/2022 1148   LEUKOCYTESUR LARGE (A) 03/23/2022 1148   Sepsis Labs: @LABRCNTIP (procalcitonin:4,lacticidven:4) )No results found for this or any previous visit (from the past 240 hour(s)).   Radiological Exams on Admission: CT ABDOMEN PELVIS W CONTRAST  Result Date: 03/23/2022 CLINICAL DATA:  Sepsis.  Pyelonephritis. EXAM: CT ABDOMEN AND PELVIS WITH CONTRAST TECHNIQUE: Multidetector CT imaging of the abdomen and pelvis was performed using the standard protocol following bolus administration of intravenous contrast. RADIATION DOSE REDUCTION: This exam was performed according to the departmental dose-optimization program which includes automated exposure control, adjustment of the mA and/or kV according to patient size and/or use of iterative reconstruction technique. CONTRAST:  OMNIPAQUE IOHEXOL 300 MG/ML  SOLN COMPARISON:  12/24/2021 FINDINGS: Lower chest: Subsegmental atelectasis noted in the lung bases. Hepatobiliary: Liver measures 20 cm craniocaudal length, enlarged. No suspicious focal abnormality within the liver parenchyma. There is no evidence for gallstones, gallbladder wall thickening, or pericholecystic fluid. No intrahepatic or extrahepatic biliary dilation. Pancreas: No focal mass lesion. No dilatation of the main duct. No intraparenchymal cyst. No peripancreatic edema. Spleen: No splenomegaly. No focal mass lesion. Adrenals/Urinary Tract: No adrenal nodule or mass. Subtle areas of decreased perfusion are seen in the posterior interpolar right kidney (image  40/2 and delayed image 17 of series 7) and in the lower pole right kidney (47/2 and delayed image 26/7). Left kidney unremarkable. No evidence for hydroureter. Bladder is decompressed with portions of the bladder obscured by beam hardening artifact from bilateral hip replacement. Stomach/Bowel: Stomach is nondistended. Duodenum is normally positioned as is the ligament of Treitz. No small bowel wall thickening. No small bowel dilatation. The terminal ileum is normal. The appendix is normal. No gross colonic mass. No colonic wall thickening. Diverticular changes are noted in the left colon without evidence of diverticulitis. Vascular/Lymphatic: No abdominal aortic aneurysm. There is no gastrohepatic or hepatoduodenal ligament lymphadenopathy. No retroperitoneal or mesenteric lymphadenopathy. No pelvic sidewall lymphadenopathy. Reproductive: Uterus is obscured.  There is no adnexal mass. Other: No intraperitoneal free fluid. Musculoskeletal: Bilateral hip replacement No worrisome lytic or sclerotic osseous abnormality. IMPRESSION: 1. Subtle areas of decreased perfusion in the right kidney compatible with pyelonephritis. No evidence for renal abscess. No hydronephrosis or urinary stone disease. 2. Hepatomegaly. 3. Left colonic diverticulosis without diverticulitis. Electronically Signed   By: Kennith Center M.D.   On: 03/23/2022 13:52      Assessment/Plan Principal Problem:   Acute pyelonephritis Active Problems:   Sepsis (HCC)   Multiple sclerosis, progressive relapsing   HLD (hyperlipidemia)   Bipolar disorder (HCC)   COPD with asthma (HCC)   Type 2 diabetes mellitus without complication, without long-term current use of insulin (HCC)  IDA (iron deficiency anemia)   Depression with anxiety   Rash   Prolonged QT interval   Principal Problem:   Acute pyelonephritis Active Problems:   Sepsis (HCC)   Multiple sclerosis, progressive relapsing   HLD (hyperlipidemia)   Bipolar disorder (HCC)    COPD with asthma (HCC)   Type 2 diabetes mellitus without complication, without long-term current use of insulin (HCC)   IDA (iron deficiency anemia)   Depression with anxiety   Rash   Prolonged QT interval   Assessment and Plan: * Acute pyelonephritis Sepsis due to acute pyelonephritis: Patient has sepsis with WBC 14.1, heart rate of 109.  Lactic acid is normal.  Currently hemodynamically stable  -Admitted to telemetry bed as inpatient -IV Rocephin -Follow-up blood culture and urine culture -Check procalcitonin and trend troponin -IV fluid: 2 L IV fluid bolus, then 75 cc/h for normal saline  Sepsis (HCC) See above  Prolonged QT interval Addendum: EKG showed prolonged QTc interval 632. -Will hold home medication including desipramine, Cymbalta, Restoril and trazodone  Rash - As needed Benadryl -Discussed with the pharmacist about IV Rocephin use.  Pharmacist suggested to give another dose of Rocephin tomorrow and observe patient to decide if we need to change antibiotics.  Depression with anxiety -Continue home medications  IDA (iron deficiency anemia) Hemoglobin stable, 12.7 -Continue iron supplement  Type 2 diabetes mellitus without complication, without long-term current use of insulin (HCC) Recent A1c 6.7.  Well-controlled.  Patient taking Trulicity at home -Sliding scale insulin  COPD with asthma (HCC) Stable -Bronchodilators  Bipolar disorder (HCC) Bipolar and depression with anxiety: Patient is calm now -Continue home medications  HLD (hyperlipidemia) -[place]  Multiple sclerosis, progressive relapsing Stable.  Patient is getting evolocumab injection q. 14 days, last dose was 5/1.  Recent MRI for brain on 02/15/2022 showed stable brain lesions.  No acute issues today. -Observe closely             DVT ppx:  SQ Lovenox  Code Status: Full code  Family Communication:   Yes, patient's  friend  at bed side.     Disposition Plan:  Anticipate  discharge back to previous environment  Consults called:  none  Admission status and Level of care: Telemetry Medical:  as inpt      Severity of Illness:  The appropriate patient status for this patient is INPATIENT. Inpatient status is judged to be reasonable and necessary in order to provide the required intensity of service to ensure the patient's safety. The patient's presenting symptoms, physical exam findings, and initial radiographic and laboratory data in the context of their chronic comorbidities is felt to place them at high risk for further clinical deterioration. Furthermore, it is not anticipated that the patient will be medically stable for discharge from the hospital within 2 midnights of admission.   * I certify that at the point of admission it is my clinical judgment that the patient will require inpatient hospital care spanning beyond 2 midnights from the point of admission due to high intensity of service, high risk for further deterioration and high frequency of surveillance required.*       Date of Service 03/23/2022    Lorretta Harp [facility]   If 7PM-7AM, please contact night-coverage [email or web address] 03/23/2022, 6:39 PM

## 2022-03-23 NOTE — ED Notes (Signed)
Pt placed on 2L, Spo2 of 88%. Pt placed on 2L [place], pt states she has sleep apnea, Spo2 now at 94% ?

## 2022-03-23 NOTE — Sepsis Progress Note (Signed)
Sepsis protocol monitored by eLink 

## 2022-03-23 NOTE — Assessment & Plan Note (Signed)
Hemoglobin stable, 12.7 ?-Continue iron supplement ?

## 2022-03-23 NOTE — Consult Note (Signed)
CODE SEPSIS - PHARMACY COMMUNICATION ? ?**Broad Spectrum Antibiotics should be administered within 1 hour of Sepsis diagnosis** ? ?Time Code Sepsis Called/Page Received: 2229 ? ?Antibiotics Ordered: 1315 ? ?Time of 1st antibiotic administration: 1326 ? ?Additional action taken by pharmacy: N/A ? ?If necessary, Name of Provider/Nurse Contacted: N/A ? ?Lorna Dibble ,PharmD ?Clinical Pharmacist  ?03/23/2022  3:12 PM ? ?

## 2022-03-23 NOTE — Assessment & Plan Note (Signed)
-  [place] ?

## 2022-03-23 NOTE — ED Notes (Signed)
Dr. Blaine Hamper notified about EKG, and temp of 102.9. PRN tylenol given.  ?

## 2022-03-23 NOTE — Assessment & Plan Note (Signed)
See above

## 2022-03-23 NOTE — Assessment & Plan Note (Signed)
Addendum: EKG showed prolonged QTc interval 632. ?-Will hold home medication including desipramine, Cymbalta, Restoril and trazodone ?

## 2022-03-23 NOTE — Assessment & Plan Note (Signed)
Stable -Bronchodilators 

## 2022-03-23 NOTE — Sepsis Progress Note (Signed)
Antibiotics given at 1326 before Code Sepsis called at 1501 ?

## 2022-03-24 ENCOUNTER — Other Ambulatory Visit: Payer: Self-pay | Admitting: Primary Care

## 2022-03-24 DIAGNOSIS — N10 Acute tubulo-interstitial nephritis: Secondary | ICD-10-CM | POA: Diagnosis not present

## 2022-03-24 DIAGNOSIS — E1165 Type 2 diabetes mellitus with hyperglycemia: Secondary | ICD-10-CM

## 2022-03-24 LAB — CBC
HCT: 34.3 % — ABNORMAL LOW (ref 36.0–46.0)
Hemoglobin: 11.2 g/dL — ABNORMAL LOW (ref 12.0–15.0)
MCH: 30.6 pg (ref 26.0–34.0)
MCHC: 32.7 g/dL (ref 30.0–36.0)
MCV: 93.7 fL (ref 80.0–100.0)
Platelets: 282 10*3/uL (ref 150–400)
RBC: 3.66 MIL/uL — ABNORMAL LOW (ref 3.87–5.11)
RDW: 15.5 % (ref 11.5–15.5)
WBC: 9.1 10*3/uL (ref 4.0–10.5)
nRBC: 0 % (ref 0.0–0.2)

## 2022-03-24 LAB — GLUCOSE, CAPILLARY
Glucose-Capillary: 146 mg/dL — ABNORMAL HIGH (ref 70–99)
Glucose-Capillary: 228 mg/dL — ABNORMAL HIGH (ref 70–99)
Glucose-Capillary: 137 mg/dL — ABNORMAL HIGH (ref 70–99)
Glucose-Capillary: 152 mg/dL — ABNORMAL HIGH (ref 70–99)

## 2022-03-24 MED ORDER — METHOCARBAMOL 500 MG PO TABS: 750.0000 mg | ORAL_TABLET | Freq: Once | ORAL | Status: DC

## 2022-03-24 MED ORDER — SEMAGLUTIDE (1 MG/DOSE) 4 MG/3ML ~~LOC~~ SOPN: 1.0000 mg | PEN_INJECTOR | SUBCUTANEOUS | 0 refills | Status: DC

## 2022-03-24 MED ORDER — FREESTYLE LIBRE 3 SENSOR MISC: 1 refills | Status: DC

## 2022-03-24 MED ORDER — METHOCARBAMOL 500 MG PO TABS
1000.0000 mg | ORAL_TABLET | Freq: Once | ORAL | Status: AC
  Administered 2022-03-24: 1000 mg via ORAL

## 2022-03-24 MED ORDER — MELATONIN 5 MG PO TABS: 5.0000 mg | ORAL_TABLET | Freq: Every evening | ORAL | Status: DC | PRN

## 2022-03-24 NOTE — Progress Notes (Addendum)
1548 ?Dr Posey Pronto aware pt states she usually takes PO robaxin at night but would like a dose right now. Verbal orders with readback for one time order  ?

## 2022-03-24 NOTE — Progress Notes (Signed)
[place] [facility] ? ? ?PATIENT NAME: Emily Moon   ? ?MR#:  [id number] ? ?DATE OF BIRTH:  [birth year] ? ?SUBJECTIVE:  ? ?patient came in with fever flank pain and dysuria. She had Tmax of 102.3. Currently 99.1. Sitting out in the chair. Feels week. Her sisters at bedside. ? ? ?VITALS:  ?Blood pressure (!) 95/46, pulse 96, temperature 99.2 ?F (37.3 ?C), resp. rate 18, height '5\' 9"'$  (1.753 m), weight 106.1 kg, last menstrual period 08/23/2014, SpO2 94 %. ? ?PHYSICAL EXAMINATION:  ? ?GENERAL:  61 y.o.-year-old patient lying in the bed with no acute distress. obese ?LUNGS: Normal breath sounds bilaterally, no wheezing, rales, rhonchi.  ?CARDIOVASCULAR: S1, S2 normal. No murmurs, rubs, or gallops.  ?ABDOMEN: Soft, nontender, nondistended. Bowel sounds present.  ?EXTREMITIES: No  edema b/l.    ?NEUROLOGIC: nonfocal  patient is alert and awake ?SKIN: No obvious rash, lesion, or ulcer.  ? ?LABORATORY PANEL:  ?CBC ?Recent Labs  ?Lab 03/24/22 ?0311  ?WBC 9.1  ?HGB 11.2*  ?HCT 34.3*  ?PLT 282  ? ? ?Chemistries  ?Recent Labs  ?Lab 03/23/22 ?1148  ?NA 136  ?K 3.8  ?CL 104  ?CO2 23  ?GLUCOSE 181*  ?BUN 15  ?CREATININE 0.96  ?CALCIUM 8.6*  ? ?Cardiac Enzymes ?No results for input(s): TROPONINI in the last 168 hours. ?RADIOLOGY:  ?CT ABDOMEN PELVIS W CONTRAST ? ?Result Date: 03/23/2022 ?CLINICAL DATA:  Sepsis.  Pyelonephritis. EXAM: CT ABDOMEN AND PELVIS WITH CONTRAST TECHNIQUE: Multidetector CT imaging of the abdomen and pelvis was performed using the standard protocol following bolus administration of intravenous contrast. RADIATION DOSE REDUCTION: This exam was performed according to the departmental dose-optimization program which includes automated exposure control, adjustment of the mA and/or kV according to patient size and/or use of iterative reconstruction technique. CONTRAST:  173m OMNIPAQUE IOHEXOL 300 MG/ML  SOLN COMPARISON:  12/24/2021 FINDINGS: Lower chest: Subsegmental atelectasis  noted in the lung bases. Hepatobiliary: Liver measures 20 cm craniocaudal length, enlarged. No suspicious focal abnormality within the liver parenchyma. There is no evidence for gallstones, gallbladder wall thickening, or pericholecystic fluid. No intrahepatic or extrahepatic biliary dilation. Pancreas: No focal mass lesion. No dilatation of the main duct. No intraparenchymal cyst. No peripancreatic edema. Spleen: No splenomegaly. No focal mass lesion. Adrenals/Urinary Tract: No adrenal nodule or mass. Subtle areas of decreased perfusion are seen in the posterior interpolar right kidney (image 40/2 and delayed image 17 of series 7) and in the lower pole right kidney (47/2 and delayed image 26/7). Left kidney unremarkable. No evidence for hydroureter. Bladder is decompressed with portions of the bladder obscured by beam hardening artifact from bilateral hip replacement. Stomach/Bowel: Stomach is nondistended. Duodenum is normally positioned as is the ligament of Treitz. No small bowel wall thickening. No small bowel dilatation. The terminal ileum is normal. The appendix is normal. No gross colonic mass. No colonic wall thickening. Diverticular changes are noted in the left colon without evidence of diverticulitis. Vascular/Lymphatic: No abdominal aortic aneurysm. There is no gastrohepatic or hepatoduodenal ligament lymphadenopathy. No retroperitoneal or mesenteric lymphadenopathy. No pelvic sidewall lymphadenopathy. Reproductive: Uterus is obscured.  There is no adnexal mass. Other: No intraperitoneal free fluid. Musculoskeletal: Bilateral hip replacement No worrisome lytic or sclerotic osseous abnormality. IMPRESSION: 1. Subtle areas of decreased perfusion in the right kidney compatible with pyelonephritis. No evidence for renal abscess. No hydronephrosis or urinary stone disease. 2. Hepatomegaly. 3. Left colonic diverticulosis without diverticulitis. Electronically Signed   By: EMisty Stanley  M.D.   On: 03/23/2022  13:52   ? ?Assessment and Plan ? CECIA EGGE is a 61 y.o. female with medical history significant of kidney stone, pyelonephritis, s/p of lithotripsy, hypertension, hyperlipidemia, diabetes mellitus, COPD, asthma, GERD, depression with anxiety, MS, fibromyalgia, bipolar, psoriasis, who presents with dysuria, bilateral flank pain, fever. ?  ?Patient states that she has dysuria, difficulty urinating, pressure feeling while urinating for more than 4 days.  In the past 2 days, she developed fever and chills.  She also complains of bilateral flank pain. ? ?Acute pyelonephritis ?Sepsis due to acute pyelonephritis: Patient has sepsis with WBC 14.1, heart rate of 109.  Lactic acid is normal.  Currently hemodynamically stable ?-IV Rocephin ?-Follow-up blood culture negative and urine culture E. coli ?-received IV fluids. ?-- Patient is advised to reschedule appointment with urology as outpatient. ?  ?Prolonged QTc interval ?Addendum: EKG showed prolonged QTc interval 632. ?-Will hold home medication including desipramine, Cymbalta, Restoril and trazodone ?-will repeat EKG in am ?  ?Depression with anxiety ?-Continue home medications ?  ?IDA (iron deficiency anemia) ?Hemoglobin stable, 12.7 ?-Continue iron supplement ?  ?Type 2 diabetes mellitus without complication, without long-term current use of insulin ([place]) ?Recent A1c 6.7.  Well-controlled.  Patient taking Trulicity at home ?-Sliding scale insulin ?  ?COPD with asthma ([place]) ?Stable ?-Bronchodilators ?  ?Bipolar disorder ([place]) ?Bipolar and depression with anxiety: Patient is calm now ?-Continue home medications ?  ?HLD (hyperlipidemia) ?-[place] ?  ?Multiple sclerosis, progressive relapsing ?Stable.  Patient is getting evolocumab injection q. 14 days, last dose was 5/1.  Recent MRI for brain on 02/15/2022 showed stable brain lesions.  No acute issues today. ? ?  ?  ?Overall improving. Will discharge patient to home tomorrow if she remains stable. Patient is in  agreement. ? ? ? ? ?Procedures: ?Family communication : sister at bedside ?Consults : none ?CODE STATUS: full ?DVT Prophylaxis : enoxaparin ?Level of care: Med-Surg ?Status is: Inpatient ?Remains inpatient appropriate because: pyelonephritis. On IV antibiotics ?  ? ?TOTAL TIME TAKING CARE OF THIS PATIENT: 35 minutes.  ?>50% time spent on counselling and coordination of care ? ?Note: This dictation was prepared with Dragon dictation along with smaller phrase technology. Any transcriptional errors that result from this process are unintentional. ? ?Fritzi Mandes M.D  ? ? ?[facility]  ? ?CC: ?Primary care physician; [name], NP  ?

## 2022-03-24 NOTE — Plan of Care (Signed)

## 2022-03-25 DIAGNOSIS — N10 Acute tubulo-interstitial nephritis: Secondary | ICD-10-CM | POA: Diagnosis not present

## 2022-03-25 DIAGNOSIS — I4581 Long QT syndrome: Secondary | ICD-10-CM

## 2022-03-25 LAB — URINE CULTURE: Culture: >=100000 — AB

## 2022-03-25 LAB — GLUCOSE, CAPILLARY
Glucose-Capillary: 148 mg/dL — ABNORMAL HIGH (ref 70–99)
Glucose-Capillary: 124 mg/dL — ABNORMAL HIGH (ref 70–99)

## 2022-03-25 MED ORDER — TEMAZEPAM 15 MG PO CAPS: 30.0000 mg | ORAL_CAPSULE | Freq: Every day | ORAL | Status: DC

## 2022-03-25 MED ORDER — DULOXETINE HCL 20 MG PO CPEP
40.0000 mg | ORAL_CAPSULE | Freq: Every day | ORAL | Status: DC
  Administered 2022-03-25: 40 mg via ORAL
  Filled 2022-03-25: qty 2

## 2022-03-25 MED ORDER — SODIUM CHLORIDE 0.9 % IV SOLN
2.0000 g | INTRAVENOUS | Status: DC
  Filled 2022-03-25: qty 20

## 2022-03-25 MED ORDER — TRAZODONE HCL 100 MG PO TABS: 100.0000 mg | ORAL_TABLET | Freq: Every day | ORAL | Status: DC

## 2022-03-25 MED ORDER — SULFAMETHOXAZOLE-TRIMETHOPRIM 800-160 MG PO TABS: 1.0000 | ORAL_TABLET | Freq: Two times a day (BID) | ORAL | 0 refills | Status: AC

## 2022-03-25 MED ORDER — SULFAMETHOXAZOLE-TRIMETHOPRIM 800-160 MG PO TABS
1.0000 | ORAL_TABLET | Freq: Once | ORAL | Status: AC
  Administered 2022-03-25: 1 via ORAL
  Filled 2022-03-25: qty 1

## 2022-03-25 NOTE — Plan of Care (Signed)

## 2022-03-25 NOTE — Progress Notes (Signed)
Pt discharged, AVS packet explained and given to patient.  Pt understands info, IV removed and pt taken to hospital entrance via wheelchair. ?

## 2022-03-25 NOTE — TOC Initial Note (Signed)
Transition of Care (TOC) - Initial/Assessment Note  ? ? ?Patient Details  ?Name: Emily Moon ?MRN: [id number] ?Date of Birth: Nov 07, 1961 ? ?Transition of Care (TOC) CM/SW Contact:    ?Conception Oms, RN ?Phone Number: ?03/25/2022, 1:15 PM ? ?Clinical Narrative:                 ? ? ?Transition of Care (TOC) Screening Note ? ? ?Patient Details  ?Name: Emily Moon ?Date of Birth: 1961-10-13 ? ? ?Transition of Care (TOC) CM/SW Contact:    ?Conception Oms, RN ?Phone Number: ?03/25/2022, 1:15 PM ? ? ? ?Transition of Care Department [facility]) has reviewed patient and no TOC needs have been identified at this time. We will continue to monitor patient advancement through interdisciplinary progression rounds. If new patient transition needs arise, please place a TOC consult. ?  ?  ?  ? ? ?Patient Goals and CMS Choice ?  ?  ?  ? ?Expected Discharge Plan and Services ?  ?  ?  ?  ?  ?                ?  ?  ?  ?  ?  ?  ?  ?  ?  ?  ? ?Prior Living Arrangements/Services ?  ?  ?  ?       ?  ?  ?  ?  ? ?Activities of Daily Living ?Home Assistive Devices/Equipment: None ?ADL Screening (condition at time of admission) ?Patient's cognitive ability adequate to safely complete daily activities?: No ?Is the patient deaf or have difficulty hearing?: No ?Does the patient have difficulty seeing, even when wearing glasses/contacts?: No ?Does the patient have difficulty concentrating, remembering, or making decisions?: No ?Patient able to express need for assistance with ADLs?: No ?Does the patient have difficulty dressing or bathing?: No ?Independently performs ADLs?: Yes (appropriate for developmental age) ?Does the patient have difficulty walking or climbing stairs?: No ?Weakness of Legs: None ?Weakness of Arms/Hands: None ? ?Permission Sought/Granted ?  ?  ?   ?   ?   ?   ? ?Emotional Assessment ?  ?  ?  ?  ?  ?  ? ?Admission diagnosis:  Pyelonephritis [N12] ?Acute pyelonephritis [N10] ?Sepsis without acute organ dysfunction, due to  unspecified organism [facility]) [A41.9] ?Patient Active Problem List  ? Diagnosis Date Noted  ? Rash 03/23/2022  ? Prolonged QT interval 03/23/2022  ? IDA (iron deficiency anemia)   ? Depression with anxiety   ? Hypokalemia 12/26/2021  ? Essential hypertension 12/25/2021  ? Hyponatremia 12/25/2021  ? Iron deficiency anemia 12/25/2021  ? Acute pyelonephritis 12/25/2021  ? Sepsis ([place]) 12/24/2021  ? Myalgia due to statin 11/04/2021  ? Swelling of lymph node 09/02/2021  ? Primary osteoarthritis of right hip 07/02/2021  ? Preop pulmonary/respiratory exam 06/27/2021  ? Imbalance 12/10/2020  ? Type 2 diabetes mellitus without complication, without long-term current use of insulin ([place]) 08/28/2020  ? 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  ? Psoriasis 11/02/2019  ? Rash and nonspecific skin eruption 02/20/2019  ? COPD with asthma ([place]) 02/10/2019  ? Epigastric pain 12/21/2018  ? OSA (obstructive sleep apnea) 12/02/2018  ? Joint swelling 10/24/2018  ? Environmental and seasonal allergies 10/24/2018  ? Greater trochanteric bursitis of right hip 09/28/2018  ? Bipolar I disorder, most recent episode (or current) manic ([place]) 05/20/2018  ? Vitamin D deficiency 02/15/2018  ? Preventative health care 11/24/2017  ? B12 deficiency 05/22/2016  ?  Vertigo 03/16/2016  ? Medicare annual wellness visit, subsequent 10/25/2015  ? Chronic back pain 10/31/2014  ? Bipolar disorder ([place]) 05/21/2014  ? Pre-operative clearance 01/22/2014  ? Deformity of right foot 12/27/2013  ? 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 Insomnia 09/01/2011  ? Obesity 05/04/2011  ? HLD (hyperlipidemia) 01/20/2011  ? Chest pain 01/20/2011  ? Chronic pain syndrome 01/07/2011  ? RENAL CALCULUS, RECURRENT 11/13/2010  ? Multiple sclerosis, progressive relapsing 08/28/2010  ? GERD 08/28/2010  ? OA (osteoarthritis) of hip 08/28/2010  ? NEPHROLITHIASIS, HX OF 08/28/2010  ? ?PCP:  [name], NP ?Pharmacy:   ?[facility] [place] [address] ?Phone: [phone] Fax: [phone] ? [id number]?Phone: [phone]: [phone]? ? ? ? ?Social Determinants of Health (SDOH) Interventions ?  ? ?Readmission Risk Interventions ?   ? View : No data to display.  ?  ?  ?  ? ? ? ?

## 2022-03-25 NOTE — Discharge Summary (Signed)
?Physician Discharge Summary ?  ?Patient: Emily Moon MRN: [id number] DOB: 1961-08-08  ?Admit date:     03/23/2022  ?Discharge date: 03/25/22  ?Discharge Physician: Fritzi Mandes  ? ?PCP: [name], NP  ? ?Recommendations at discharge:  ? ?follow-up PCP in 1 to 2 week ? ? ?Discharge Diagnoses: ?acute pyelonephritis ?E. coli UTI ? ?Hospital Course: ?Emily Moon is a 61 y.o. female with medical history significant of kidney stone, pyelonephritis, s/p of lithotripsy, hypertension, hyperlipidemia, diabetes mellitus, COPD, asthma, GERD, depression with anxiety, MS, fibromyalgia, bipolar, psoriasis, who presents with dysuria, bilateral flank pain, fever. ?  ?Patient states that she has dysuria, difficulty urinating, pressure feeling while urinating for more than 4 days.  In the past 2 days, she developed fever and chills.  She also complains of bilateral flank pain. ?  ?Acute pyelonephritis ?Sepsis due to acute pyelonephritis: Patient has sepsis with WBC 14.1, heart rate of 109.  Lactic acid is normal.   ?-IV Rocephin--per Pharmacist--sulfa drug of choice with h/o allergy and pyelonephritis. Pt tolerated 1 dose well ?-Follow-up blood culture negative and urine culture E. Coli (pansensitive) ?-received IV fluids. ?-- Patient is advised to reschedule appointment with urology as outpatient. ?--sepsis resolved ?  ?Prolonged QTc interval ?Addendum: EKG showed prolonged QTc interval 632 ?--repeat EKG 432 ?-discussed with pharmacist will resume desipramine, Cymbalta, Restoril and trazodone ?  ?Depression with anxiety ?-Continue home medications ?  ?IDA (iron deficiency anemia) ?Hemoglobin stable, 12.7 ?-Continue iron supplement ?  ?Type 2 diabetes mellitus without complication, without long-term current use of insulin ([place]) ?Recent A1c 6.7.  Well-controlled.  Patient taking Trulicity at home ?-Sliding scale insulin ?  ?COPD with asthma ([place]) ?Stable ?-Bronchodilators ?  ?Bipolar disorder ([place]) ?Bipolar and  depression with anxiety: Patient is calm now ?-Continue home medications ?  ?HLD (hyperlipidemia) ?-[place] ?  ?Multiple sclerosis, progressive relapsing ?Stable.  Patient is getting evolocumab injection q. 14 days, last dose was 5/1.  Recent MRI for brain on 02/15/2022 showed stable brain lesions.  No acute issues today. ?  ?Consultants: none ?Procedures performed: none  ?Disposition: Home ?Diet recommendation:  ?Cardiac diet ?DISCHARGE MEDICATION: ?Allergies as of 03/25/2022   ? ?   Reactions  ? Albuterol Shortness Of Breath, Other (See Comments)  ? Makes pt feel jittery/ tacycardic  ? Crestor [rosuvastatin] Other (See Comments)  ? Joint pain, muscle pain, and hair loss  ? Halcion [triazolam] Other (See Comments)  ? Dizziness,headaches,bladder problems  ? Levaquin [levofloxacin In D5w] Diarrhea, Itching  ? Shoulder pain  ? Metformin And Related Rash  ? Naproxen Sodium   ? Patient tolerates in small doses. Her reaction is swelling of the lower extremities.   ? Cefaclor Other (See Comments)  ? Doesn't remember---unsure if actually allergic   ? Sulfa Antibiotics Itching  ? Unsure of reaction possibly itching  ? Tramadol Itching, Nausea And Vomiting  ? Zinc Other (See Comments)  ? constipation  ?constipation   ? Aripiprazole Other (See Comments)  ? Muscle tension/cramping  ? Diclofenac Sodium Rash  ? "made very sick"  ? ?  ? ?  ?Medication List  ?  ? ?STOP taking these medications   ? ?Dimethyl Fumarate 120 & 240 MG Misc ?  ?metFORMIN 500 MG 24 hr tablet ?Commonly known as: GLUCOPHAGE-XR ?  ? ?  ? ?TAKE these medications   ? ?aspirin EC 81 MG tablet ?Take 81 mg by mouth at bedtime. Swallow whole. ?  ?Azelastine HCl 137 MCG/SPRAY Soln ?PLACE 2  SPRAYS INTO BOTH NOSTRILS 2 (TWO) TIMES DAILY ?  ?BIG 100 (BIOTIN) PO ?Take 300 mg by mouth at bedtime. ?  ?cetirizine 10 MG tablet ?Commonly known as: ZYRTEC ?Take 10 mg by mouth 2 (two) times daily. ?  ?clindamycin 1 % external solution ?Commonly known as: CLEOCIN T ?Apply to scalp  once or twice a day prn bumps ?  ?cyanocobalamin 1000 MCG/ML injection ?Commonly known as: (VITAMIN B-12) ?Inject 1,000 mcg into the muscle every 30 (thirty) days. ?  ?desipramine 25 MG tablet ?Commonly known as: NORPRAMIN ?Take 1 tablet (25 mg total) by mouth daily. ?  ?diphenhydrAMINE 2 % cream ?Commonly known as: BENADRYL ?Apply topically 3 (three) times daily as needed for itching. ?  ?DULoxetine 20 MG capsule ?Commonly known as: CYMBALTA ?Take 2 capsules (40 mg total) by mouth daily. ?  ?fenofibrate 145 MG tablet ?Commonly known as: TRICOR ?Take 1 tablet (145 mg total) by mouth daily. For cholesterol. ?  ?ferrous sulfate 325 (65 FE) MG tablet ?Take 1 tablet (325 mg total) by mouth 2 (two) times daily with a meal. ?  ?FreeStyle Libre 3 Sensor Misc ?Place 1 sensor on the skin every 14 days. Use to check glucose continuously ?  ?hydrOXYzine 50 MG capsule ?Commonly known as: VISTARIL ?Take 2 capsules (100 mg total) by mouth at bedtime. TAKE 1 CAPSLE BY MOUTH AT NOON, 1 AT 6 PM, AND 1 AS NEEDED ?  ?lamoTRIgine 150 MG tablet ?Commonly known as: LAMICTAL ?Take 2 tablets (300 mg total) by mouth at bedtime. ?  ?Lysine 500 MG Tabs ?Take 2,000 mg by mouth daily. ?  ?methocarbamol 500 MG tablet ?Commonly known as: ROBAXIN ?TAKE 1-2 TABLETS (500-1,000 MG TOTAL) BY MOUTH AT BEDTIME AS NEEDED FOR MUSCLE SPASMS. ?  ?metoprolol tartrate 50 MG tablet ?Commonly known as: LOPRESSOR ?Take 1 tablet (50 mg total) by mouth 2 (two) times daily. ?  ?neomycin-bacitracin-polymyxin [phone] ointment ?Apply 1 application topically 4 (four) times daily as needed (for cut/scrapes.). ?  ?pantoprazole 40 MG tablet ?Commonly known as: PROTONIX ?Take 1 tablet (40 mg total) by mouth 2 (two) times daily before a meal. For heartburn ?  ?polyvinyl alcohol 1.4 % ophthalmic solution ?Commonly known as: LIQUIFILM TEARS ?Place 2 drops into both eyes at bedtime. ?  ?Repatha SureClick 161 MG/ML Soaj ?Generic drug: Evolocumab ?Inject 1 Dose into the skin  every 14 (fourteen) days. ?  ?Semaglutide (1 MG/DOSE) 4 MG/3ML Sopn ?Inject 1 mg as directed once a week. for diabetes. ?  ?Stiolto Respimat 2.5-2.5 MCG/ACT Aers ?Generic drug: Tiotropium Bromide-Olodaterol ?2 puffs by In Vitro route at bedtime. ?  ?sulfamethoxazole-trimethoprim 800-160 MG tablet ?Commonly known as: BACTRIM DS ?Take 1 tablet by mouth 2 (two) times daily for 7 days. ?  ?temazepam 30 MG capsule ?Commonly known as: RESTORIL ?TAKE ONE TO TWO CAPSULES BY MOUTH NIGHTLY AT BEDTIME ?  ?traZODone 50 MG tablet ?Commonly known as: DESYREL ?Take 2 tablets (100 mg total) by mouth at bedtime. ?  ?valACYclovir 1000 MG tablet ?Commonly known as: VALTREX ?Take 2 tablets by mouth twice daily for 1 day as needed for herpes outbreak. ?  ?vitamin C 500 MG tablet ?Commonly known as: ASCORBIC ACID ?Take 500 mg by mouth 2 (two) times daily. ?  ?Vitamin D3 25 MCG tablet ?Commonly known as: Vitamin D ?Take 3,000 Units by mouth daily. ?  ? ?  ? ? Follow-up Information   ? ? [name], NP. Schedule an appointment as soon as possible for a visit  in 1 week(s).   ?Specialty: Internal Medicine ?Why: Hospital follow-up ?Contact information: ?[place] [address] ?[phone] ? ? ?  ?  ? ? [name] Sable, MD .   ?Specialties: Cardiology, Radiology ?Contact information: ?[place] [address] ?[phone] ? ? ?  ?  ? ?  ?  ? ?  ? ?Discharge Exam: ?Filed Weights  ? 03/23/22 1141  ?Weight: 106.1 kg  ? ? ? ?Condition at discharge: fair ? ?The results of significant diagnostics from this hospitalization (including imaging, microbiology, ancillary and laboratory) are listed below for reference.  ? ?Imaging Studies: ?CT ABDOMEN PELVIS W CONTRAST ? ?Result Date: 03/23/2022 ?CLINICAL DATA:  Sepsis.  Pyelonephritis. EXAM: CT ABDOMEN AND PELVIS WITH CONTRAST TECHNIQUE: Multidetector CT imaging of the abdomen and pelvis was performed using the standard protocol following bolus administration of  intravenous contrast. RADIATION DOSE REDUCTION: This exam was performed according to the departmental dose-optimization program which includes automated exposure control, adjustment of the mA and/or kV acco

## 2022-03-26 ENCOUNTER — Ambulatory Visit: Payer: PPO | Admitting: Primary Care

## 2022-03-28 LAB — CULTURE, BLOOD (ROUTINE X 2)
Culture: NO GROWTH
Culture: NO GROWTH

## 2022-03-31 ENCOUNTER — Telehealth: Payer: Self-pay

## 2022-03-31 NOTE — Chronic Care Management (AMB) (Signed)
The patient contacted me with concerns about cost of medication and being in the donut hole. Ozempic was affordable for 1 month. Forms for patient assistance from [employer] filled out and sent for review. Patient aware of process.  [employer] was also contacted for the medication Repatha. The foundation explained that the patient needed to contact them for insurance verification. Information given to the patient and she will contact the company.   Charlene Brooke, CPP notified  Avel Sensor, [place]  [phone]

## 2022-04-01 ENCOUNTER — Encounter: Payer: Self-pay | Admitting: Primary Care

## 2022-04-01 ENCOUNTER — Ambulatory Visit (INDEPENDENT_AMBULATORY_CARE_PROVIDER_SITE_OTHER): Payer: PPO | Admitting: Primary Care

## 2022-04-01 VITALS — BP 118/62 | HR 78 | Temp 99.5°F | Ht 69.0 in | Wt 233.0 lb

## 2022-04-01 DIAGNOSIS — J449 Chronic obstructive pulmonary disease, unspecified: Secondary | ICD-10-CM

## 2022-04-01 DIAGNOSIS — N10 Acute tubulo-interstitial nephritis: Secondary | ICD-10-CM | POA: Diagnosis not present

## 2022-04-01 DIAGNOSIS — M545 Low back pain, unspecified: Secondary | ICD-10-CM | POA: Diagnosis not present

## 2022-04-01 DIAGNOSIS — G8929 Other chronic pain: Secondary | ICD-10-CM

## 2022-04-01 DIAGNOSIS — K219 Gastro-esophageal reflux disease without esophagitis: Secondary | ICD-10-CM | POA: Diagnosis not present

## 2022-04-01 DIAGNOSIS — I10 Essential (primary) hypertension: Secondary | ICD-10-CM | POA: Diagnosis not present

## 2022-04-01 DIAGNOSIS — B009 Herpesviral infection, unspecified: Secondary | ICD-10-CM | POA: Diagnosis not present

## 2022-04-01 DIAGNOSIS — F3170 Bipolar disorder, currently in remission, most recent episode unspecified: Secondary | ICD-10-CM

## 2022-04-01 DIAGNOSIS — G35 Multiple sclerosis: Secondary | ICD-10-CM | POA: Diagnosis not present

## 2022-04-01 DIAGNOSIS — E785 Hyperlipidemia, unspecified: Secondary | ICD-10-CM

## 2022-04-01 DIAGNOSIS — A419 Sepsis, unspecified organism: Secondary | ICD-10-CM

## 2022-04-01 DIAGNOSIS — E1165 Type 2 diabetes mellitus with hyperglycemia: Secondary | ICD-10-CM | POA: Diagnosis not present

## 2022-04-01 DIAGNOSIS — D509 Iron deficiency anemia, unspecified: Secondary | ICD-10-CM | POA: Diagnosis not present

## 2022-04-01 LAB — POCT GLYCOSYLATED HEMOGLOBIN (HGB A1C): Hemoglobin A1C: 6.5 % — AB (ref 4.0–5.6)

## 2022-04-01 MED ORDER — FERROUS SULFATE 325 (65 FE) MG PO TABS: 325.0000 mg | ORAL_TABLET | Freq: Two times a day (BID) | ORAL | 0 refills | Status: DC

## 2022-04-01 NOTE — Patient Instructions (Addendum)
Please schedule a follow-up visit with your urologist and the hematologist. ? ?Continue Ozempic 1 mg weekly for diabetes. ? ?Please schedule a follow up visit for 6 months for a diabetes check. ? ?It was a pleasure to see you today! ? ?

## 2022-04-01 NOTE — Assessment & Plan Note (Signed)
Following with psychiatry. ? ?Continue temazepam 30 mg at bedtime, trazodone 100 mg at bedtime, Cymbalta 40 mg daily, Lamictal 300 mg at bedtime, hydroxyzine 100 mg at bedtime, desipramine 25 mg daily. ?

## 2022-04-01 NOTE — Assessment & Plan Note (Signed)
Second hospital admission in 2023 for urosepsis. ?Hospital notes, labs, imaging reviewed. ? ?She will follow-up with urology to discuss. ?

## 2022-04-01 NOTE — Assessment & Plan Note (Signed)
Controlled. ? ?Continue pantoprazole 40 mg twice daily. ?

## 2022-04-01 NOTE — Assessment & Plan Note (Signed)
Following with hematology.  Encouraged her to schedule follow-up visit. ? ?Refill provided for ferrous sulfate 325 mg twice daily. ?CBC and iron studies reviewed from February 2023, CBC reviewed from May 2023. ?

## 2022-04-01 NOTE — Assessment & Plan Note (Signed)
Controlled with A1c of 6.5 today. ? ?Continue Ozempic 1 mg weekly. ?

## 2022-04-01 NOTE — Assessment & Plan Note (Signed)
Controlled. ? ?Continue valacyclovir 2000 mg as needed. ?

## 2022-04-01 NOTE — Assessment & Plan Note (Signed)
Recent hospitalization involving sepsis. ?Hospital notes, labs, imaging reviewed. ? ?Strongly advise she follow-up with urology as this is her second hospitalization for sepsis secondary to UTI. ?Question if this is secondary to vaginal atrophy? ? ?Continue Bactrim DS tablets twice daily until complete. ?

## 2022-04-01 NOTE — Progress Notes (Signed)
? ?Subjective:  ? ? Patient ID: Emily Moon, female    DOB: 1961/11/02, 61 y.o.   MRN: [id number] ? ?HPI ? ?Emily Moon is a very pleasant 61 y.o. female with a medical history of type 2 diabetes, hypertension, OSA, COPD, multiple sclerosis, chronic pain syndrome, insomnia, anxiety and depression, recurrent renal calculi, cystitis with sepsis, and deficiency anemia, who presents today for follow-up of diabetes and hospital follow-up. ? ?1) Type 2 Diabetes: ? ?Current medications include: Ozempic 1 mg weekly ? ?She is checking her blood glucose continuously and is getting readings averaging low 100's.  ? ?Last A1C: 6.7 in December 2022, 6.5 mg today ?Last Eye Exam: Up-to-date ?Last Foot Exam: Up-to-date ?Pneumonia Vaccination: 2016 ?Urine Microalbumin: Due ?Statin: Intolerant.  Managed on Repatha ? ?Dietary changes since last visit: None ? ? ?Exercise: None ? ?2) Hospital Follow Up: ? ?She presented to [facility] ED on 03/23/2022 for 2 days of fever, 4 days of dysuria, nausea, vomiting, flank pain.  Prior history of admission for cystitis with sepsis in February 2023. ? ?Work-up in the ED revealed evidence of sepsis from pyelonephritis.  She was admitted for further evaluation and treatment. ? ?During her ED and hospital stay she underwent CT abdomen pelvis which did not show urologic obstruction, but did show evidence of pyelonephritis.  She was treated with IV antibiotics and IV fluids. ? ?She was discharged home on 03/25/2022 with recommendations for urology and PCP follow-up follow-up, prescription for Bactrim DS tablets twice daily x7 days. ? ?Since her hospital admission she has yet to follow up with her Urologist. She has nearly completed her Bactrim DS course.  ? ?3) Jury Duty Excuse: She is requesting excuse from jury duty due to her inability to focus, cogitative issues, and inability to sit longer for 30 minutes to due to chronic lower extremity and back pain.  ? ?She has been summoned to [employer] for  April 23, 2022 at [facility]. Juror # [id number].  ? ?4) Essential Hypertension/hyperlipidemia: Currently managed on metoprolol tartrate 50 mg BID.  Statin intolerant, managed on Repatha every 2 weeks per cardiology.  She denies chest pain or increased shortness of breath. ? ?BP Readings from Last 3 Encounters:  ?04/01/22 118/62  ?03/25/22 108/63  ?02/05/22 (!) 116/53  ? ?5) Chronic Back/Hip Pain: Currently managed on methocarbamol 1000 mg HS, Advil 800 mg HS and Advil 400 mg in AM.  No longer on narcotics.  Overall feels about the same as she did when on narcotics. ? ?6) Bipolar Disorder/Insomnia: Following with psychiatry, managed on temazepam 30 mg, Trazodone 100 mg HS, Cymbalta 40 mg daily, Lamictal 300 mg at bedtime, hydroxyzine 100 mg at bedtime, desipramine 25 mg daily.  Overall feels well managed. ? ?7) Asthma: Chronic, following with pulmonology.  Currently managed on Stiolto Respimat inhaler, Ventolin inhaler as needed.  She recently discovered that her insurance will no longer cover her Stiolto Respimat inhaler so she will be contacting her pulmonologist soon. ? ?8) HSV: Chronic with type I.  Currently managed on valacyclovir 2000 mg as needed. ? ? ?Review of Systems  ?Constitutional:  Positive for fatigue.  ?Eyes:  Negative for visual disturbance.  ?Respiratory:  Negative for shortness of breath.   ?Cardiovascular:  Negative for chest pain.  ?Gastrointestinal:  Negative for constipation and diarrhea.  ?Genitourinary:  Negative for difficulty urinating.  ?Musculoskeletal:  Positive for arthralgias.  ?Neurological:  Negative for headaches.  ?Psychiatric/Behavioral:  Negative for sleep disturbance and suicidal ideas. The patient  is not nervous/anxious.   ? ?   ? ? ?Past Medical History:  ?Diagnosis Date  ? Arthritis   ? osteo  ? Asthma   ? Bipolar disorder ([place]) 05/21/2014  ? Cataracts, bilateral   ? COPD (chronic obstructive pulmonary disease) ([place])   ? DDD (degenerative disc disease), cervical   ? also back  ?  Depression   ? Diabetes mellitus without complication ([place])   ? Dizziness   ? Positional  ? Edema   ? feet/legs  ? Fibromyalgia syndrome   ? Fungal infection   ? Finger nails  ? GERD (gastroesophageal reflux disease)   ? Gout   ? Heart palpitations   ? Hip dysplasia, congenital 09/15/2013  ? History of kidney stones   ? Hypercholesterolemia   ? IDA (iron deficiency anemia)   ? Multiple sclerosis ([place])   ? weakness  ? Osteoporosis   ? osteoarthritis  ? Pneumonia   ? PONV (postoperative nausea and vomiting)   ? no problem after cataract surgery  ? Prediabetes 07/22/2018  ? Psoriasis   ? Shortness of breath dyspnea   ? wheezing  ? Sleep apnea 2012  ? sleep study / slight, no interventions  ? Urinary frequency   ? Weight gain 06/21/2014  ? ? ?Social History  ? ?Socioeconomic History  ? Marital status: Divorced  ?  Spouse name: Not on file  ? Number of children: 1  ? Years of education: Not on file  ? Highest education level: Not on file  ?Occupational History  ? Occupation: Therapist, art Rep at [employer]  ?  Employer: OTHER  ?Tobacco Use  ? Smoking status: Former  ?  Packs/day: 1.00  ?  Years: 25.00  ?  Pack years: 25.00  ?  Types: Cigarettes  ?  Quit date: 2019  ?  Years since quitting: 4.3  ? Smokeless tobacco: Never  ? Tobacco comments:  ?  occasional use  ?Vaping Use  ? Vaping Use: Former  ?Substance and Sexual Activity  ? Alcohol use: No  ?  Alcohol/week: 0.0 standard drinks  ? Drug use: Yes  ?  Types: Methylphenidate, Marijuana  ?  Comment: Delta 8 - once per day  ? Sexual activity: Never  ?Other Topics Concern  ? Not on file  ?Social History Narrative  ? Not on file  ? ?Social Determinants of Health  ? ?Financial Resource Strain: Low Risk   ? Difficulty of Paying Living Expenses: Not hard at all  ?Food Insecurity: No Food Insecurity  ? Worried About Charity fundraiser in the Last Year: Never true  ? Ran Out of Food in the Last Year: Never true  ?Transportation Needs: No Transportation Needs  ? Lack of  Transportation (Medical): No  ? Lack of Transportation (Non-Medical): No  ?Physical Activity: Inactive  ? Days of Exercise per Week: 0 days  ? Minutes of Exercise per Session: 0 min  ?Stress: No Stress Concern Present  ? Feeling of Stress : Not at all  ?Social Connections: Not on file  ?Intimate Partner Violence: Not At Risk  ? Fear of Current or Ex-Partner: No  ? Emotionally Abused: No  ? Physically Abused: No  ? Sexually Abused: No  ? ? ?Past Surgical History:  ?Procedure Laterality Date  ? CATARACT EXTRACTION W/PHACO Left 05/21/2015  ? Procedure: CATARACT EXTRACTION PHACO AND INTRAOCULAR LENS PLACEMENT (IOC);  Surgeon: Birder Robson, MD;  Location: [facility] ORS;  Service: Ophthalmology;  Laterality: Left;  [place] 00:35 ?  AP% 22.9 ?CDE 8.11 ?fluid pack lot #[id number]  ? CATARACT EXTRACTION W/PHACO Right 06/04/2015  ? Procedure: CATARACT EXTRACTION PHACO AND INTRAOCULAR LENS PLACEMENT (IOC);  Surgeon: Birder Robson, MD;  Location: [facility] ORS;  Service: Ophthalmology;  Laterality: Right;  US:00:48 ?AP%: 10.5 ?CDE:5.08 ?Fluid lot #[id number]  ? CYSTOSCOPY/URETEROSCOPY/HOLMIUM LASER/STENT PLACEMENT Bilateral 09/22/2016  ? Procedure: CYSTOSCOPY/URETEROSCOPY/HOLMIUM LASER/STENT PLACEMENT;  Surgeon: Hollice Espy, MD;  Location: [facility] ORS;  Service: Urology;  Laterality: Bilateral;  ? EYE SURGERY  2015  ? tissue biopsy  ? FOOT SURGERY  2015  ? JOINT REPLACEMENT Left 2013  ? hip replacement  ? LITHOTRIPSY    ? PTOSIS REPAIR Bilateral 02/18/2016  ? Procedure: BILATERAL PTOSIS REPAIR UPPER EYELIDS;  Surgeon: Karle Starch, MD;  Location: [place];  Service: Ophthalmology;  Laterality: Bilateral;  LEAVE PT EARLY AM  ? thumb surgery Right   ? TONSILLECTOMY  1973  ? TOTAL HIP ARTHROPLASTY Right 07/02/2021  ? Procedure: TOTAL HIP ARTHROPLASTY ANTERIOR APPROACH;  Surgeon: Gaynelle Arabian, MD;  Location: [facility];  Service: Orthopedics;  Laterality: Right;  ? ? ?Family History  ?Problem Relation Age of Onset  ? Cancer Father   ?      Abdomen with mastasis  ? Cancer Mother   ? Heart disease Mother   ? Kidney disease Neg Hx   ? Bladder Cancer Neg Hx   ? Prostate cancer Neg Hx   ? Kidney cancer Neg Hx   ? ? ?Allergies  ?Allergen Reactions  ? A

## 2022-04-01 NOTE — Assessment & Plan Note (Signed)
Continue methocarbamol 1000 mg at bedtime. ?Encourage to use Advil sparingly as needed.  Encouraged Tylenol use. ?

## 2022-04-01 NOTE — Assessment & Plan Note (Signed)
Following with neurology. ?Reviewed MRI brain from April 2023 ?

## 2022-04-01 NOTE — Assessment & Plan Note (Signed)
Following with cardiology, continue Repatha every 2 weeks. ?

## 2022-04-01 NOTE — Assessment & Plan Note (Signed)
Controlled, following with pulmonology. ? ?She will notify pulmonology that her maintenance inhaler is no longer covered. ? ?Continue Stiolto Respimat inhaler daily. ?

## 2022-04-01 NOTE — Assessment & Plan Note (Signed)
Controlled.  Continue metoprolol tartrate 50 mg twice daily. ?

## 2022-04-02 ENCOUNTER — Telehealth: Payer: Self-pay

## 2022-04-02 NOTE — Progress Notes (Signed)
Chronic Care Management Pharmacy Assistant   Name: Emily Moon  MRN: [id number] DOB: 30-Oct-1961  Reason for Encounter: CCM (Appointment Reminder)   Medications: Outpatient Encounter Medications as of 04/02/2022  Medication Sig   aspirin EC 81 MG tablet Take 81 mg by mouth at bedtime. Swallow whole.   Azelastine HCl 137 MCG/SPRAY SOLN PLACE 2 SPRAYS INTO BOTH NOSTRILS 2 (TWO) TIMES DAILY   B Complex-Biotin-FA (BIG 100, BIOTIN, PO) Take 300 mg by mouth at bedtime.   cetirizine (ZYRTEC) 10 MG tablet Take 10 mg by mouth 2 (two) times daily.   clindamycin (CLEOCIN T) 1 % external solution Apply to scalp once or twice a day prn bumps   Continuous Blood Gluc Sensor (FREESTYLE LIBRE 3 SENSOR) MISC Place 1 sensor on the skin every 14 days. Use to check glucose continuously   cyanocobalamin (,VITAMIN B-12,) 1000 MCG/ML injection Inject 1,000 mcg into the muscle every 30 (thirty) days.   desipramine (NORPRAMIN) 25 MG tablet Take 1 tablet (25 mg total) by mouth daily.   diphenhydrAMINE (BENADRYL) 2 % cream Apply topically 3 (three) times daily as needed for itching.   DULoxetine (CYMBALTA) 20 MG capsule Take 2 capsules (40 mg total) by mouth daily.   Evolocumab (REPATHA SURECLICK) 102 MG/ML SOAJ Inject 1 Dose into the skin every 14 (fourteen) days.   fenofibrate (TRICOR) 145 MG tablet Take 1 tablet (145 mg total) by mouth daily. For cholesterol.   ferrous sulfate 325 (65 FE) MG tablet Take 1 tablet (325 mg total) by mouth 2 (two) times daily with a meal.   hydrOXYzine (VISTARIL) 50 MG capsule Take 2 capsules (100 mg total) by mouth at bedtime. TAKE 1 CAPSLE BY MOUTH AT NOON, 1 AT 6 PM, AND 1 AS NEEDED   lamoTRIgine (LAMICTAL) 150 MG tablet Take 2 tablets (300 mg total) by mouth at bedtime.   Lysine 500 MG TABS Take 2,000 mg by mouth daily.   methocarbamol (ROBAXIN) 500 MG tablet TAKE 1-2 TABLETS (500-1,000 MG TOTAL) BY MOUTH AT BEDTIME AS NEEDED FOR MUSCLE SPASMS.   metoprolol tartrate  (LOPRESSOR) 50 MG tablet Take 1 tablet (50 mg total) by mouth 2 (two) times daily.   neomycin-bacitracin-polymyxin (NEOSPORIN) [id number] ointment Apply 1 application topically 4 (four) times daily as needed (for cut/scrapes.).   pantoprazole (PROTONIX) 40 MG tablet Take 1 tablet (40 mg total) by mouth 2 (two) times daily before a meal. For heartburn   polyvinyl alcohol (LIQUIFILM TEARS) 1.4 % ophthalmic solution Place 2 drops into both eyes at bedtime.   Ruxolitinib Phosphate (OPZELURA) 1.5 % CREA Apply topically.   Semaglutide, 1 MG/DOSE, 4 MG/3ML SOPN Inject 1 mg as directed once a week. for diabetes.   STIOLTO RESPIMAT 2.5-2.5 MCG/ACT AERS 2 puffs by In Vitro route at bedtime.   temazepam (RESTORIL) 30 MG capsule TAKE ONE TO TWO CAPSULES BY MOUTH NIGHTLY AT BEDTIME   traZODone (DESYREL) 50 MG tablet Take 2 tablets (100 mg total) by mouth at bedtime.   valACYclovir (VALTREX) 1000 MG tablet Take 2 tablets by mouth twice daily for 1 day as needed for herpes outbreak.   vitamin C (ASCORBIC ACID) 500 MG tablet Take 500 mg by mouth 2 (two) times daily.   Vitamin D3 (VITAMIN D) 25 MCG tablet Take 3,000 Units by mouth daily.   No facility-administered encounter medications on file as of 04/02/2022.   Emily Moon was contacted to remind of upcoming telephone visit with Emily Moon on 04/07/2022 at 3:45. Patient  was reminded to have any blood glucose and blood pressure readings available for review at appointment.   Patient confirmed appointment.  Are you having any problems with your medications? Yes   Do you have any concerns you like to discuss with the pharmacist? Yes Patient would like patient assistance with Ozempic. It is costing her $45, but next month it will be over $200 a month due to the doughnut hole. Emily Moon completed the paperwork on 03/31/2022. I advised patient she will be informed when the paperwork is available for her to come in and sign.   Patient is going  skydiving for her 61th birthday on [birth year].  CCM referral has been placed prior to visit?  No   Star Rating Drugs: Medication:  Last Fill: Day Supply Ozempic 1 mg  03/25/2022 Emily Moon, CPP notified  Emily Moon, [place] Pharmacy Assistant [phone]

## 2022-04-06 NOTE — Telephone Encounter (Signed)
Ozempic [employer] [facility]) forms were printed and placed in patient documents folder in front office. Patient will need to sign documents and provide proof of income.

## 2022-04-07 ENCOUNTER — Ambulatory Visit: Payer: PPO | Admitting: Pharmacist

## 2022-04-07 DIAGNOSIS — G35 Multiple sclerosis: Secondary | ICD-10-CM

## 2022-04-07 DIAGNOSIS — F3170 Bipolar disorder, currently in remission, most recent episode unspecified: Secondary | ICD-10-CM

## 2022-04-07 DIAGNOSIS — J449 Chronic obstructive pulmonary disease, unspecified: Secondary | ICD-10-CM

## 2022-04-07 DIAGNOSIS — E785 Hyperlipidemia, unspecified: Secondary | ICD-10-CM

## 2022-04-07 DIAGNOSIS — D509 Iron deficiency anemia, unspecified: Secondary | ICD-10-CM

## 2022-04-07 DIAGNOSIS — E1165 Type 2 diabetes mellitus with hyperglycemia: Secondary | ICD-10-CM

## 2022-04-07 DIAGNOSIS — M791 Myalgia, unspecified site: Secondary | ICD-10-CM

## 2022-04-07 NOTE — Progress Notes (Signed)
Chronic Care Management Pharmacy Note  04/15/2022 Name:  Emily Moon MRN:  [id number] DOB:  June 19, 1961  Summary: CCM F/U visit -DM: excellent control currently, per CGM data time-in-range (80-180) is 97%; she reports Ozempic is cost-prohibitive in donut hole -COPD/asthma: pt has been taking Stiolto for several months but is now in donut hole and cost is prohibitive  -HLD: pt is tolerating Repatha well, uses [employer] grant to cover copays but [place] is on hold due to requiring Medicare verification -Anemia: pt has completed 3 iron infusions in March and continues on oral iron twice daily; she complains of constipation with oral iron; last HGB was 11.2 (03/24/22) -MS: pt follows with Dr Melrose Nakayama, she has not been able to afford DMARD therapy, most recently Ocrevus infusions were recommended per neurology and she has not followed up on this  Recommendations/Changes made from today's visit: -Pursue PAP for Ozempic and Stiolto -Updated [employer] with Medicare card; it is now active and can be used for [place] it may be reasonable to reduce oral iron to daily or QOD now that she has completed infusions; advised to follow up with hematology to discuss -Advised to follow up with Neurology regarding Ocrevus infusion/insurance approval  Plan: -[place] will follow up on patient assistance applications in 1-2 weeks -Pharmacist follow up televisit scheduled for 3 months -PCP F/U due 6 months (~Nov 17th)    Subjective: Emily Moon is an 61 y.o. year old female who is a primary patient of [name], NP.  The CCM team was consulted for assistance with disease management and care coordination needs.    Engaged with patient by telephone for follow up visit in response to provider referral for pharmacy case management and/or care coordination services.   Consent to Services:  The patient was given information about Chronic Care Management  services, agreed to services, and gave verbal consent prior to initiation of services.  Please see initial visit note for detailed documentation.   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 She only leaves home for grocery trips and doctor appts.  Recent office visits: 04/01/22 DNP Allie Bossier OV: f/u DM. A1c 6.5%; no med changes 12/31/21 NP [name] OV: hospital f/u; workup IDA, referred for colonoscopy. D/C metformin (rash); continue Trulicity. Referred to hematology for anemia evaluation.  11/04/21 Dr Silvio Pate OV: f/u DM. Pt mixed up Trulicity and Repatha. Concerned about high BG later in day - add metformin 500 mg daily. Can increase to 2 tablets as tolerated.  09/19/21 Dr Silvio Pate OV: UTI - rx'd Macrobid  09/02/2021 - [name], NP - Video Visit - Patient presented for facial swelling. Labs: A1c. Increase Trulicity to 3 mg/week.  06/04/2021 - [name], NP - Patient presented for annual exam. Labs:A1c, Cytology - PAP. Change: cyanocobalamin (,VITAMIN B-12,) 1000 MCG/ML injection, famotidine (PEPCID) 20 MG tablet and pantoprazole (PROTONIX) 40 MG tablet.  06/03/2021 - Andrez Grime, LPN - Patient presented for Annual Wellness Visit.   05/08/2021 - Owens Loffler, MD - Patient presented for psoriatic arthritis. Referral to Rheumatology   04/23/2021 - [name], NP - Patient presented for Pre-op clearance. Start: valACYclovir (VALTREX) 1000  MG tablet.  Recent consult visits: 12/04/21 NP Tammy Parrett (pulmonary [facility]): f/u COPD. Start Stiolto 2 puffs daily. Use Xoponex  PRN.  12/03/21 Dr Casimiro Needle [facility] (psychiatry): f/u depression. Short visit, no med change and RTC 2 months for in-person visit.  11/18/21 Dr Laurence Ferrari (dermatology): f/u eczema and folliculitis. Start clindamycin solution for scalp.  10/27/21 Dr Garen Lah (cardiology): f/u tachycardia, improved with lopressor. No med changes.  10/21/21 Dr Laurence Ferrari (dermatology): f/u eczema flare. Restart clobetasol foam.  10/14/21 Dr Melrose Nakayama [facility] Neurology): initial consult for MS. Start Tecfidera 120 mg BID x 2 weeks then increase to 240 mg BID. F/U 2 months. 08/22/21 PA Kathlen Mody (Cardiology): f/u tachycardia. D/c toprol and Start metoprolol tartrate 50 mg BID. Ordered 2-wk heart monitor 08/08/2021 - Dr Garen Lah, Cardiology - Patient presented for follow up inappropriate sinus tachycardia. Labs: Increase metoprolol succinate to 75 mg daily. Returned to 50 mg dose shortly after due to worsening sx. 07/30/2021 - Behavioral Health - Video Visit for Mixed Bipolar, depression, insomnia. No med changes. 06/27/2021 - NP Parrett, Pulmonary - Patient presented for COPD with Asthma. Labs: DG Chest 2 views No medication changes. 06/26/2021 - Cardiology - Patient presented for follow up inappropriate sinus tachycardia. Labs: Lipid and EKG. No medication changes. 06/19/2021 - Dr Jefm Bryant, Rheumatology - referred to rule out psoriatic arthritis. No definite dx of psoriasis.  Hospital visits: Medication Reconciliation was completed by comparing discharge summary, patient's EMR and Pharmacy list, and upon discussion with patient.  Admitted to the hospital on 12/24/21 due to Pyelonephritis/sepsis. Discharge date was 12/27/21. Discharged from [place] UTI. Follow up PCP for iron deficiency workup  New?Medications Started at [facility] Discharge:?? -started Amoxicillin due to Urosepsis -Oral iron  Medications that remain the same after Hospital Discharge:??  -All other medications will remain the same.     Objective:  Lab Results  Component Value Date   CREATININE 0.96 03/23/2022   BUN 15 03/23/2022   GFR 60.84 12/31/2021   GFRNONAA >60 03/23/2022   GFRAA >60 02/19/2020   NA 136 03/23/2022   K 3.8 03/23/2022   CALCIUM 8.6 (L) 03/23/2022   CO2 23 03/23/2022   GLUCOSE 181 (H) 03/23/2022    Lab Results  Component Value Date/Time   HGBA1C 6.5 (A) 04/01/2022 03:04 PM   HGBA1C 6.7 (A) 11/04/2021 10:20 AM   HGBA1C 6.6 (H) 07/02/2021 01:08 PM   HGBA1C 6.9 (H) 03/10/2021 04:28 PM   GFR 60.84 12/31/2021 01:38 PM   GFR 75.32 08/28/2020 12:33 PM   MICROALBUR 1.0 03/10/2021 04:28 PM    Last diabetic Eye exam:  Lab Results  Component Value Date/Time   HMDIABEYEEXA No Retinopathy 01/28/2022 12:00 AM    Last diabetic Foot exam: No results found for: HMDIABFOOTEX   Lab Results  Component Value Date   CHOL 159 03/10/2021   HDL 39.90 03/10/2021   LDLCALC 81 03/10/2021   LDLDIRECT 53.0 08/01/2020   TRIG 189.0 (H) 03/10/2021   CHOLHDL 4 03/10/2021       Latest Ref Rng & Units 12/24/2021    6:01 PM 06/23/2021    2:53 PM 02/19/2020    3:53 PM  Hepatic Function  Total Protein 6.5 - 8.1 g/dL 7.3   7.7   7.6    Albumin 3.5 - 5.0 g/dL 3.8   4.8   4.5    AST 15 - 41 U/L _0 ALT 0 - 44 U/L 20   44  19    Alk Phosphatase 38 - 126 U/L 55   44   75    Total Bilirubin 0.3 - 1.2 mg/dL 0.7   0.5   0.8      Lab Results  Component Value Date/Time   TSH 0.902 02/19/2020 03:53 PM   TSH 1.31 05/10/2019 02:34 PM   TSH 4.32 11/24/2018 08:26 AM   FREET4 1.08 02/19/2020 03:53 PM   FREET4 0.82 07/25/2013 12:16 PM       Latest Ref Rng & Units 03/24/2022    3:11 AM 03/23/2022   11:48 AM 12/31/2021    1:38 PM  CBC  WBC 4.0 - 10.5 K/uL 9.1   14.1   10.7    Hemoglobin 12.0 - 15.0 g/dL 11.2   12.7   11.1    Hematocrit 36.0 - 46.0 % 34.3   38.5   33.8    Platelets 150 - 400 K/uL 282   356   814.0      Lab Results  Component Value Date/Time   VD25OH 75.66 01/22/2020 02:32 PM   VD25OH  61.68 08/15/2019 11:13 AM    Clinical ASCVD: No  The 10-year ASCVD risk score (Arnett DK, et al., 2019) is: 5.6%   Values used to calculate the score:     Age: 34 years     Sex: Female     Is Non-Hispanic African American: No     Diabetic: Yes     Tobacco smoker: No     Systolic Blood Pressure: 695 mmHg     Is BP treated: No     HDL Cholesterol: 39.9 mg/dL     Total Cholesterol: 159 mg/dL       06/03/2021    2:18 PM 04/23/2021    2:10 PM 04/05/2020    3:09 PM  Depression screen PHQ 2/9  Decreased Interest _0 Down, Depressed, Hopeless _1 PHQ - 2 Score _2 Altered sleeping 3 3 0  Tired, decreased energy 3 3 0  Change in appetite 0 0 0  Feeling bad or failure about yourself  0 3 0  Trouble concentrating 1 0 0  Moving slowly or fidgety/restless 1 0 0  Suicidal thoughts 1 0 0  PHQ-9 Score _3 Difficult doing work/chores Somewhat difficult Very difficult Somewhat difficult        View : No data to display.           Social History   Tobacco Use  Smoking Status Former   Packs/day: 1.00   Years: 25.00   Pack years: 25.00   Types: Cigarettes   Quit date: 2019   Years since quitting: 4.4  Smokeless Tobacco Never  Tobacco Comments   occasional use   BP Readings from Last 3 Encounters:  04/01/22 118/62  03/25/22 108/63  02/05/22 (!) 116/53   Pulse Readings from Last 3 Encounters:  04/01/22 78  03/25/22 97  02/05/22 92   Wt Readings from Last 3 Encounters:  04/01/22 233 lb (105.7 kg)  03/23/22 234 lb (106.1 kg)  01/15/22 236 lb 14.4 oz (107.5 kg)   BMI Readings from Last 3 Encounters:  04/01/22 34.41 kg/m  03/23/22 34.56 kg/m  01/15/22 34.98 kg/m    Assessment/Interventions: Review of patient past medical history, allergies, medications, health status, including review of consultants reports, laboratory and other test data, was performed as part of comprehensive evaluation and provision of chronic care management services.  SDOH:   (Social Determinants of Health) assessments and interventions performed: Yes  SDOH Screenings   Alcohol Screen: Low Risk    Last Alcohol Screening Score (AUDIT): 0  Depression (PHQ2-9): Medium Risk   PHQ-2 Score: 15  Financial Resource Strain: Low Risk    Difficulty of Paying Living Expenses: Not hard at all  Food Insecurity: No Food Insecurity   Worried About Charity fundraiser in the Last Year: Never true   Ran Out of Food in the Last Year: Never true  Housing: Low Risk    Last Housing Risk Score: 0  Physical Activity: Inactive   Days of Exercise per Week: 0 days   Minutes of Exercise per Session: 0 min  Social Connections: Not on file  Stress: No Stress Concern Present   Feeling of Stress : Not at all  Tobacco Use: Medium Risk   Smoking Tobacco Use: Former   Smokeless Tobacco Use: Never   Passive Exposure: Not on file  Transportation Needs: No Transportation Needs   Lack of Transportation (Medical): No   Lack of Transportation (Non-Medical): No    CCM Care Plan  Allergies  Allergen Reactions   Albuterol Shortness Of Breath and Other (See Comments)    Makes pt feel jittery/ tacycardic   Crestor [Rosuvastatin] Other (See Comments)    Joint pain, muscle pain, and hair loss   Halcion [Triazolam] Other (See Comments)    Dizziness,headaches,bladder problems   Levaquin [Levofloxacin In D5w] Diarrhea and Itching    Shoulder pain   Metformin And Related Rash   Naproxen Sodium     Patient tolerates in small doses. Her reaction is swelling of the lower extremities.    Cefaclor Other (See Comments)    Doesn't remember---unsure if actually allergic    Sulfa Antibiotics Itching    Unsure of reaction possibly itching   Tramadol Itching and Nausea And Vomiting   Zinc Other (See Comments)    constipation  constipation    Aripiprazole Other (See Comments)    Muscle tension/cramping   Diclofenac Sodium Rash    "made very sick"    Medications Reviewed Today     Reviewed  by [name], NP (Nurse Practitioner) on 04/01/22 at 1653  Med List Status: <None>   Medication Order Taking? Sig Documenting Provider Last Dose Status Informant  aspirin EC 81 MG tablet [id number] Yes Take 81 mg by mouth at bedtime. Swallow whole. [provider] Taking Active Self  Azelastine HCl 137 MCG/SPRAY SOLN [id number] Yes PLACE 2 SPRAYS INTO BOTH NOSTRILS 2 (TWO) TIMES DAILY [name], NP Taking Active Self  B Complex-Biotin-FA (BIG 100, BIOTIN, PO) [id number] Yes Take 300 mg by mouth at bedtime. [provider] Taking Active Self  cetirizine (ZYRTEC) 10 MG tablet [id number] Yes Take 10 mg by mouth 2 (two) times daily. [provider] Taking Active Self  clindamycin (CLEOCIN T) 1 % external solution [id number] Yes Apply to scalp once or twice a day prn bumps Moye, [place], MD Taking Active Self  Continuous Blood Gluc Sensor (FREESTYLE LIBRE 3 SENSOR) [place] [id number] Yes Place 1 sensor on the skin every 14 days. Use to check glucose continuously [name], NP Taking Active   cyanocobalamin (,VITAMIN B-12,) 1000 MCG/ML injection [id number] Yes Inject 1,000 mcg into the muscle every 30 (thirty) days. [provider] Taking Active Self  desipramine (NORPRAMIN) 25 MG tablet [id number] Yes Take 1 tablet (25 mg total) by mouth daily. Norma Fredrickson, MD Taking Active Self  diphenhydrAMINE (BENADRYL) 2 % cream [id number] Yes Apply topically 3 (three) times daily as needed for itching. [provider] Taking Active Self  DULoxetine (CYMBALTA) 20 MG capsule [id number] Yes Take 2 capsules (40 mg total) by mouth daily. Norma Fredrickson, MD Taking Active Self  Evolocumab [facility] SURECLICK) 353 MG/ML Darden Palmer [id number] Yes Inject 1 Dose into the skin every 14 (fourteen) days. [name] Sable, MD Taking Active Self  fenofibrate (TRICOR) 145 MG tablet [id number] Yes Take 1 tablet (145 mg total) by mouth daily. For cholesterol. [name] Sable,  MD Taking Active Self  ferrous sulfate 325 (65 FE) MG tablet [id number]  Take 1 tablet (325 mg total) by mouth 2 (two) times daily with a meal. [name], NP  Active   hydrOXYzine (VISTARIL) 50 MG capsule [id number] Yes Take 2 capsules (100 mg total) by mouth at bedtime. TAKE 1 CAPSLE BY MOUTH AT NOON, 1 AT 6 PM, AND 1 AS NEEDED Plovsky, Gerald, MD Taking Active Self  lamoTRIgine (LAMICTAL) 150 MG tablet [id number] Yes Take 2 tablets (300 mg total) by mouth at bedtime. Norma Fredrickson, MD Taking Active Self  Lysine 500 MG TABS [id number] Yes Take 2,000 mg by mouth daily. [provider] Taking Active Self  methocarbamol (ROBAXIN) 500 MG tablet [id number] Yes TAKE 1-2 TABLETS (500-1,000 MG TOTAL) BY MOUTH AT BEDTIME AS NEEDED FOR MUSCLE SPASMS. [name], NP Taking Active Self  metoprolol tartrate (LOPRESSOR) 50 MG tablet [id number]  Take 1 tablet (50 mg total) by mouth 2 (two) times daily. Furth, Cadence H, PA-C  Expired 03/23/22 2359 Self  neomycin-bacitracin-polymyxin (NEOSPORIN) [phone] ointment [id number] Yes Apply 1 application topically 4 (four) times daily as needed (for cut/scrapes.). [provider] Taking Active Self           Med Note Damita Dunnings, Lovey Newcomer Mar 10, 2021  3:56 PM)    pantoprazole (PROTONIX) 40 MG tablet [id number] Yes Take 1 tablet (40 mg total) by mouth 2 (two) times daily before a meal. For heartburn [name], NP Taking Active Self  polyvinyl alcohol (LIQUIFILM TEARS) 1.4 % ophthalmic solution [id number] Yes Place 2 drops into both eyes at bedtime. [provider] Taking Active Self  Ruxolitinib Phosphate (OPZELURA) 1.5 % CREA [id number] Yes Apply topically. [provider] Taking Active   Semaglutide, 1 MG/DOSE, 4 MG/3ML SOPN [id number] Yes Inject 1 mg as directed once a week. for diabetes. [name], NP Taking Active   STIOLTO RESPIMAT 2.5-2.5 MCG/ACT AERS [id number] Yes 2 puffs by In Vitro route at bedtime.  [provider] Taking Active Self  sulfamethoxazole-trimethoprim (BACTRIM DS) 800-160 MG tablet [id number] Yes Take 1 tablet by mouth 2 (two) times daily for 7 days. Fritzi Mandes, MD Taking Active   temazepam (RESTORIL) 30 MG capsule [id number] Yes TAKE ONE TO TWO CAPSULES BY MOUTH NIGHTLY AT BEDTIME Plovsky, Berneta Sages, MD Taking Active Self  traZODone (DESYREL) 50 MG tablet [id number] Yes Take 2 tablets (100 mg total) by mouth at bedtime. Norma Fredrickson, MD Taking Active Self  valACYclovir (VALTREX) 1000 MG tablet [id number] Yes Take 2 tablets by mouth twice daily for 1 day as needed for herpes outbreak. [name], NP Taking Active Self  vitamin C (ASCORBIC ACID) 500 MG tablet [id number] Yes Take 500 mg by mouth 2 (two) times daily. [provider] Taking Active Self  Vitamin D3 (VITAMIN D) 25 MCG tablet [id number] Yes Take 3,000 Units by mouth daily. [provider] Taking Active Self  Patient Active Problem List   Diagnosis Date Noted   Rash 03/23/2022   Prolonged QT interval 03/23/2022   IDA (iron deficiency anemia)    Depression with anxiety    Hypokalemia 12/26/2021   Essential hypertension 12/25/2021   Hyponatremia 12/25/2021   Iron deficiency anemia 12/25/2021   Acute pyelonephritis 12/25/2021   Sepsis ([place]) 12/24/2021   Myalgia due to statin 11/04/2021   Swelling of lymph node 09/02/2021   Primary osteoarthritis of right hip 07/02/2021   Preop pulmonary/respiratory exam 06/27/2021   Imbalance 12/10/2020   Type 2 diabetes mellitus with hyperglycemia ([place]) 08/28/2020   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   Psoriasis 11/02/2019   Rash and nonspecific skin eruption 02/20/2019   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/10/2019   Epigastric pain 12/21/2018   OSA (obstructive sleep apnea) 12/02/2018   Joint swelling 10/24/2018   Environmental and seasonal allergies 10/24/2018   Greater trochanteric bursitis of right hip 09/28/2018    Vitamin D deficiency 02/15/2018   Preventative health care 11/24/2017   B12 deficiency 05/22/2016   Vertigo 03/16/2016   Medicare annual wellness visit, subsequent 10/25/2015   Chronic back pain 10/31/2014   Bipolar disorder ([place]) 05/21/2014   Pre-operative clearance 01/22/2014   Deformity of right foot 12/27/2013   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 09/15/2013   Insomnia 09/01/2011   Obesity 05/04/2011   HLD (hyperlipidemia) 01/20/2011   Chest pain 01/20/2011   Chronic pain syndrome 01/07/2011   RENAL CALCULUS, RECURRENT 11/13/2010   Multiple sclerosis, progressive relapsing 08/28/2010   GERD 08/28/2010   OA (osteoarthritis) of hip 08/28/2010   NEPHROLITHIASIS, HX OF 08/28/2010    Immunization History  Administered Date(s) Administered   Influenza Split 08/05/2011   Influenza, Seasonal, Injecte, Preservative Fre 06/26/2018   Influenza,inj,Quad PF,6+ Mos 08/17/2013, 07/13/2015, 07/06/2017, 06/26/2018, 06/26/2019, 07/04/2020, 09/03/2021   Influenza-Unspecified 07/08/2016, 06/18/2017, 06/26/2018   PFIZER(Purple Top)SARS-COV-2 Vaccination 02/06/2020, 02/28/2020, 07/01/2020, 03/19/2021   Pfizer Covid-19 Vaccine Bivalent Booster 85yr & up 09/03/2021   Pneumococcal Conjugate-13 07/05/2014   Pneumococcal Polysaccharide-23 07/13/2015    Conditions to be addressed/monitored:  Hyperlipidemia, Diabetes, COPD, and Asthma, MS, Tachycardia, Bipolar Disorder   Care Plan : [place] Updates made by FCharlton Haws [place] 04/15/2022 12:00 AM     Problem: Hyperlipidemia, Diabetes, COPD, and Asthma, MS, Tachycardia, Bipolar Disorder   Priority: High     Long-Range Goal: Disease management   Start Date: 09/15/2021  Expected End Date: 09/15/2022  This Visit's Progress: On track  Recent Progress: On track  Priority: High  Note:   Current Barriers:  Unable to independently afford treatment regimen  Pharmacist Clinical Goal(s):  Patient will  verbalize ability to afford treatment regimen through collaboration with PharmD and provider.   Interventions: 1:1 collaboration with CPleas Koch NP regarding development and update of comprehensive plan of care as evidenced by provider attestation and co-signature Inter-disciplinary care team collaboration (see longitudinal plan of care) Comprehensive medication review performed; medication list updated in electronic medical record  Diabetes (A1c goal <7%) -Controlled - A1c is at goal; pt has stopped metformin due to skin rash; she started [employer] - Diabetes program in January and reports it has been very helpful -Pt reports Ozempic is working very well but is cost prohibitive in donut hole Reviewed AGP report: 03/25/22 to 04/07/22  Time in range (70-180): 93% (goal > 70%)  High (181-250): 7%%  Low (< 70): 0% (goal < 4%)  GMI: 6.4%; Average glucose: 131  -  Current medications: Ozempic 1 mg weekly (Thurs) - Appropriate, Effective, Safe, Query Accessible Freestyle Libre 3 -Medications previously tried: metformin, Trulicity -Educated on F1M and blood sugar goals; Carbohydrate counting and/or plate method -Reviewed Ozempic PAP - she should qualify. Pt will come sign paperwork in office -Recommend to continue current medication; pursue PAP for Ozempic  Tachycardia (Goal: HR < 100) -Controlled - pt reports improvement in side effect since switching from succinate to tartrate; -Current treatment  Metoprolol tartrate 50 mg BID - Appropriate, Effective, Safe, Accessible -Recommended to continue current medication  Hyperlipidemia: (LDL goal < 100) -Controlled - LDL is at goal; pt endorses compliance with medications; she is intolerant to statins; she uses [place] for [place] and reports recent issues with grant - requiring Medicare verification which patient reports she submitted weeks ago but has not heard any updates -Current treatment: Repatha 140 mg q14 days -  Appropriate, Effective, Safe, Query Accessible Fenofibrate 145 mg daily - Appropriate, Effective, Safe, Accessible Aspirin 81 mg daily - Appropriate, Effective, Safe, Accessible -Educated on Cholesterol goals;  -Reviewed [employer] grant via online portal, indeed it is awaiting Medicare verification; uploaded photo of patient's Medicare insurance card to portal, verification passed and grant is now active again -Recommended to continue current medication  COPD / asthma (Goal: control symptoms and prevent exacerbations) -Controlled - pt reports she is using Stiolto and it is working well for her but is cost prohibitive -Pulmonary function testing: FEV1 at 63%, ratio 72, FVC 68% (04/2020) -Current treatment  Stiolto Respimat 2.5-2.5 mcg/act - 2 puff daily - Appropriate, Effective, Safe, Query Accessible Levalbuterol HFA prn - once a day typically - Appropriate, Effective, Safe, Accessible Levalbuterol nebulizer -Appropriate, Effective, Safe, Accessible Cetirizine 10 mg HS - Appropriate, Effective, Safe, Accessible Azelastine 137 mcg nasal spray PRN -Appropriate, Effective, Safe, Accessible -Medications previously tried: Firefighter, Advair   -Patient reports consistent use of maintenance inhaler -Frequency of rescue inhaler use: daily -Counseled on Benefits of consistent maintenance inhaler use -Recommend to continue current medication; pursue Stiolto PAP  Bipolar Depression / Anxiety / Insomnia (Goal: manage symptoms) -Controlled - pt reports current regimen is working well enough for her -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 -managed per Dr Casimiro Needle -PHQ9: 15 (05/2021) - moderate/severe depression -Current treatment: Desipramine 25 mg daily -Appropriate, Effective, Safe, Accessible Duloxetine 20 mg - 2 cap daily -Appropriate, Effective, Safe, Accessible Lamotrigine 150 mg - 2 tab HS -Appropriate, Effective, Safe, Accessible Temazepam 30 mg 1-2  cap HS -Appropriate, Effective, Safe, Accessible Trazodone 50 mg - 2 tab HS -Appropriate, Effective, Safe, Accessible Hydroxyzine 50 mg - 2 tab HS -Appropriate, Effective, Safe, Accessible -Medications previously tried/failed: Rexulti -Educated on Benefits of medication for symptom control; discussed consequences of abruptly stopping medications, advised that she speak with psychiatrist if she is thinking about stopping any medications -Recommended to continue current medication  Multiple Sclerosis (Goal: manage symptoms) -Not ideally controlled  - pt has not started DMARD for MS due to cost concerns (dimethyl fumarate was approved by insurance but over $1000 still);  -Pt follows with Neurology (Dr Melrose Nakayama) - most recently recommended Ocrevus infusions, pt reports she has not started these yet -Current treatment  Methocarbamol 500 mg - Appropriate, Effective, Safe, Accessible Ocrevus infusions per neurology - not started/pending approval -Medications previously tried/failed: Dimethyl fumarate - $1100 copay -Recommended to follow up with neurology regarding Ocrevus infusions  Anemia (Goal: improve HGB) -Improving - pt has started iron infusions with hematology (3/9, 3/16, 3/23);  most recent HGB 11.2 (03/24/22), Tsat 11.8% (12/31/21) has not been checked post-infusions; she is still taking OTC iron twice daily and reports constipation -Current treatment  Ferrous sulfate 325 mg BID - Query appropriate -Advised to take iron with Vitamin C to maximize absorption -Reviewed constipation side effect - it would be reasonable to reduce oral iron to daily or every-other-day since she is s/p infusions -Recommend to continue current medication; f/u with hematology as scheduled  Patient Goals/Self-Care Activities Patient will:  - take medications as prescribed as evidenced by patient report and record review focus on medication adherence by routine collaborate with provider on medication access solutions  (Ozempic, Stiolto, Repatha)     Medication Assistance:  Stiolto - PAP in process Ozempic - PAP in process [place] 11/05/21 - 11/04/22  ID: [id number]  GRP: [id number]  BIN: [id number] PCN: [id number]  Compliance/Adherence/Medication fill history: Care Gaps: None  Star-Rating Drugs: Trulicity - [facility] cares (approved through 11/15/22)  Patient's preferred pharmacy is: [facility], [place] [address] [place] [address] Phone: [phone] Fax: [phone]  Uses pill box? No - prefers bottles Pt endorses 100% compliance  We discussed: Current pharmacy is preferred with insurance plan and patient is satisfied with pharmacy services Patient decided to: Continue current medication management strategy  Care Plan and Follow Up Patient Decision:  Patient agrees to Care Plan and Follow-up.  Plan: Telephone follow up appointment with care management team member scheduled for:  3 months  Charlene Brooke, PharmD, [facility] Clinical Pharmacist [facility] [phone]

## 2022-04-08 DIAGNOSIS — M79605 Pain in left leg: Secondary | ICD-10-CM | POA: Diagnosis not present

## 2022-04-08 DIAGNOSIS — S82102A Unspecified fracture of upper end of left tibia, initial encounter for closed fracture: Secondary | ICD-10-CM | POA: Diagnosis not present

## 2022-04-08 DIAGNOSIS — M25572 Pain in left ankle and joints of left foot: Secondary | ICD-10-CM | POA: Diagnosis not present

## 2022-04-08 DIAGNOSIS — S82143A Displaced bicondylar fracture of unspecified tibia, initial encounter for closed fracture: Secondary | ICD-10-CM | POA: Insufficient documentation

## 2022-04-08 DIAGNOSIS — M25562 Pain in left knee: Secondary | ICD-10-CM | POA: Diagnosis not present

## 2022-04-08 DIAGNOSIS — M79672 Pain in left foot: Secondary | ICD-10-CM | POA: Diagnosis not present

## 2022-04-08 DIAGNOSIS — G8929 Other chronic pain: Secondary | ICD-10-CM | POA: Insufficient documentation

## 2022-04-09 ENCOUNTER — Encounter: Payer: Self-pay | Admitting: Family Medicine

## 2022-04-15 ENCOUNTER — Telehealth: Payer: Self-pay

## 2022-04-15 NOTE — Telephone Encounter (Signed)
Printed and completed forms. Faxed completed application to [facility].

## 2022-04-15 NOTE — Progress Notes (Signed)
    Chronic Care Management Pharmacy Assistant   Name: Emily Moon  MRN: [id number] DOB: [birth year]  Reason for Encounter: CCM (Repatha Approval)   Called patient and let her know her [employer] account (for repatha) has been approved. Also informed her she can use her grant at the pharmacy. Patient voiced understanding.  I also called in regards to patient's Ozempic patient assistance. They need another copy of patient's proof of income as the proof came across too dark to read. Please fax it to [phone]. Please include patients full name, date of birth and ID #: [id number] on the fax cover sheet.   Charlene Brooke, CPP notified  Emily Moon, [place] Clinical Pharmacy Assistant [phone]

## 2022-04-15 NOTE — Patient Instructions (Signed)
Visit Information  Phone number for Pharmacist: [phone]   Goals Addressed   None     Care Plan : [place]  Updates made by [name], [facility] since 04/15/2022 12:00 AM     Problem: Hyperlipidemia, Diabetes, COPD, and Asthma, MS, Tachycardia, Bipolar Disorder   Priority: High     Long-Range Goal: Disease management   Start Date: 09/15/2021  Expected End Date: 09/15/2022  This Visit's Progress: On track  Recent Progress: On track  Priority: High  Note:   Current Barriers:  Unable to independently afford treatment regimen  Pharmacist Clinical Goal(s):  Patient will verbalize ability to afford treatment regimen through collaboration with PharmD and provider.   Interventions: 1:1 collaboration with [name], NP regarding development and update of comprehensive plan of care as evidenced by provider attestation and co-signature Inter-disciplinary care team collaboration (see longitudinal plan of care) Comprehensive medication review performed; medication list updated in electronic medical record  Diabetes (A1c goal <7%) -Controlled - A1c is at goal; pt has stopped metformin due to skin rash; she started [employer] - Diabetes program in January and reports it has been very helpful -Pt reports Ozempic is working very well but is cost prohibitive in donut hole Reviewed AGP report: 03/25/22 to 04/07/22  Time in range (70-180): 93% (goal > 70%)  High (181-250): 7%%  Low (< 70): 0% (goal < 4%)  GMI: 6.4%; Average glucose: 131  -Current medications: Ozempic 1 mg weekly (Thurs) - Appropriate, Effective, Safe, Query Accessible Freestyle Libre 3 -Medications previously tried: metformin, Trulicity -Educated on Y0V and blood sugar goals; Carbohydrate counting and/or plate method -Reviewed Ozempic PAP - she should qualify. Pt will come sign paperwork in office -Recommend to continue current medication; pursue PAP for Ozempic  Tachycardia (Goal: HR  < 100) -Controlled - pt reports improvement in side effect since switching from succinate to tartrate; -Current treatment  Metoprolol tartrate 50 mg BID - Appropriate, Effective, Safe, Accessible -Recommended to continue current medication  Hyperlipidemia: (LDL goal < 100) -Controlled - LDL is at goal; pt endorses compliance with medications; she is intolerant to statins; she uses [facility] for [place] and reports recent issues with grant - requiring Medicare verification which patient reports she submitted weeks ago but has not heard any updates -Current treatment: Repatha 140 mg q14 days - Appropriate, Effective, Safe, Query Accessible Fenofibrate 145 mg daily - Appropriate, Effective, Safe, Accessible Aspirin 81 mg daily - Appropriate, Effective, Safe, Accessible -Educated on Cholesterol goals;  -Reviewed [employer] grant via online portal, indeed it is awaiting Medicare verification; uploaded photo of patient's Medicare insurance card to portal, verification passed and grant is now active again -Recommended to continue current medication  COPD / asthma (Goal: control symptoms and prevent exacerbations) -Controlled - pt reports she is using Stiolto and it is working well for her but is cost prohibitive -Pulmonary function testing: FEV1 at 63%, ratio 72, FVC 68% (04/2020) -Current treatment  Stiolto Respimat 2.5-2.5 mcg/act - 2 puff daily - Appropriate, Effective, Safe, Query Accessible Levalbuterol HFA prn - once a day typically - Appropriate, Effective, Safe, Accessible Levalbuterol nebulizer -Appropriate, Effective, Safe, Accessible Cetirizine 10 mg HS - Appropriate, Effective, Safe, Accessible Azelastine 137 mcg nasal spray PRN -Appropriate, Effective, Safe, Accessible -Medications previously tried: Breo, Advair   -Patient reports consistent use of maintenance inhaler -Frequency of rescue inhaler use: daily -Counseled on Benefits of consistent maintenance inhaler  use -Recommend to continue current medication; pursue Stiolto PAP  Bipolar Depression /  Anxiety / Insomnia (Goal: manage symptoms) -Controlled - pt reports current regimen is working well enough for her -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 -managed per Dr Casimiro Needle -PHQ9: 15 (05/2021) - moderate/severe depression -Current treatment: Desipramine 25 mg daily -Appropriate, Effective, Safe, Accessible Duloxetine 20 mg - 2 cap daily -Appropriate, Effective, Safe, Accessible Lamotrigine 150 mg - 2 tab HS -Appropriate, Effective, Safe, Accessible Temazepam 30 mg 1-2 cap HS -Appropriate, Effective, Safe, Accessible Trazodone 50 mg - 2 tab HS -Appropriate, Effective, Safe, Accessible Hydroxyzine 50 mg - 2 tab HS -Appropriate, Effective, Safe, Accessible -Medications previously tried/failed: Rexulti -Educated on Benefits of medication for symptom control; discussed consequences of abruptly stopping medications, advised that she speak with psychiatrist if she is thinking about stopping any medications -Recommended to continue current medication  Multiple Sclerosis (Goal: manage symptoms) -Not ideally controlled  - pt has not started DMARD for MS due to cost concerns (dimethyl fumarate was approved by insurance but over $1000 still);  -Pt follows with Neurology (Dr Melrose Nakayama) - most recently recommended Ocrevus infusions, pt reports she has not started these yet -Current treatment  Methocarbamol 500 mg - Appropriate, Effective, Safe, Accessible Ocrevus infusions per neurology - not started/pending approval -Medications previously tried/failed: Dimethyl fumarate - $1100 copay -Recommended to follow up with neurology regarding Ocrevus infusions  Anemia (Goal: improve HGB) -Improving - pt has started iron infusions with hematology (3/9, 3/16, 3/23); most recent HGB 11.2 (03/24/22), Tsat 11.8% (12/31/21) has not been checked post-infusions; she is still taking  OTC iron twice daily and reports constipation -Current treatment  Ferrous sulfate 325 mg BID - Query appropriate -Advised to take iron with Vitamin C to maximize absorption -Reviewed constipation side effect - it would be reasonable to reduce oral iron to daily or every-other-day since she is s/p infusions -Recommend to continue current medication; f/u with hematology as scheduled  Patient Goals/Self-Care Activities Patient will:  - take medications as prescribed as evidenced by patient report and record review focus on medication adherence by routine collaborate with provider on medication access solutions (Ozempic, [place], [place])      Patient verbalizes understanding of instructions and care plan provided today and agrees to view in [place]. Active MyChart status and patient understanding of how to access instructions and care plan via MyChart confirmed with patient.    Telephone follow up appointment with pharmacy team member scheduled for: 3 months  Charlene Brooke, PharmD, [facility] Clinical Pharmacist [place] [facility] at [facility] [phone]

## 2022-04-15 NOTE — Progress Notes (Signed)
    Chronic Care Management Pharmacy Assistant   Name: Emily Moon  MRN: [id number] DOB: [birth year]  Reason for Encounter: CCM (Stiolto PAP 2023)   Patient assistance forms for Stiolto have been completed and uploaded.  Charlene Brooke, CPP notified  Marijean Niemann, [place] Clinical Pharmacy Assistant [phone]

## 2022-04-16 NOTE — Telephone Encounter (Signed)
Re-faxed income documents.

## 2022-04-20 ENCOUNTER — Ambulatory Visit: Payer: PPO | Admitting: Family Medicine

## 2022-04-20 ENCOUNTER — Telehealth: Payer: Self-pay

## 2022-04-20 NOTE — Progress Notes (Signed)
    Chronic Care Management Pharmacy Assistant   Name: Emily Moon  MRN: [id number] DOB: [birth year]  Reason for Encounter: CCM (Ozempic 2023 PAP Approval)   Per [employer] patient has been approved for [employer] patient assistance through 11/15/2022. Medication will be delivered to office in 10-14 days from today 04/20/2022. Patient is enrolled in auto-refills. Called patient to inform her.  Charlene Brooke, CPP notified  Marijean Niemann, [place] Clinical Pharmacy Assistant [phone]

## 2022-04-21 DIAGNOSIS — S82102A Unspecified fracture of upper end of left tibia, initial encounter for closed fracture: Secondary | ICD-10-CM | POA: Diagnosis not present

## 2022-04-21 DIAGNOSIS — M25562 Pain in left knee: Secondary | ICD-10-CM | POA: Diagnosis not present

## 2022-04-23 ENCOUNTER — Other Ambulatory Visit: Payer: Self-pay | Admitting: Primary Care

## 2022-04-23 DIAGNOSIS — S83412A Sprain of medial collateral ligament of left knee, initial encounter: Secondary | ICD-10-CM | POA: Diagnosis not present

## 2022-04-23 DIAGNOSIS — K219 Gastro-esophageal reflux disease without esophagitis: Secondary | ICD-10-CM

## 2022-04-24 ENCOUNTER — Other Ambulatory Visit: Payer: Self-pay | Admitting: *Deleted

## 2022-04-24 MED ORDER — REPATHA SURECLICK 140 MG/ML ~~LOC~~ SOAJ: 1.0000 | SUBCUTANEOUS | 3 refills | Status: DC

## 2022-04-27 ENCOUNTER — Other Ambulatory Visit: Payer: Self-pay | Admitting: Primary Care

## 2022-04-27 DIAGNOSIS — E538 Deficiency of other specified B group vitamins: Secondary | ICD-10-CM

## 2022-04-27 NOTE — Telephone Encounter (Signed)
See message 04/15/22. Patient scheduled with ortho.

## 2022-04-30 ENCOUNTER — Telehealth: Payer: Self-pay

## 2022-04-30 NOTE — Telephone Encounter (Signed)
Patient advised that Ozempic medication has been delivered today and placed int he fridge for patient to pick up when she can.

## 2022-05-06 ENCOUNTER — Telehealth: Payer: Self-pay

## 2022-05-06 NOTE — Chronic Care Management (AMB) (Signed)
Spoke with patient and she confirmed shipment of Ozempic  4 month supply.  I contacted [facility] and spoke with pharmacist. Missing information supplied and patient is authorized for Stiolto  2.5 inhaler  90ds  with 3 RF's. This will go out in mail tomorrow  05/07/22.  Charlene Brooke, CPP notified  Avel Sensor, [place]  [phone]

## 2022-05-12 ENCOUNTER — Telehealth: Payer: Self-pay | Admitting: Adult Health

## 2022-05-12 ENCOUNTER — Ambulatory Visit (HOSPITAL_COMMUNITY): Payer: PPO | Admitting: Psychiatry

## 2022-05-12 DIAGNOSIS — F310 Bipolar disorder, current episode hypomanic: Secondary | ICD-10-CM | POA: Diagnosis not present

## 2022-05-12 DIAGNOSIS — F314 Bipolar disorder, current episode depressed, severe, without psychotic features: Secondary | ICD-10-CM | POA: Diagnosis not present

## 2022-05-12 MED ORDER — LAMOTRIGINE 200 MG PO TABS: ORAL_TABLET | ORAL | 5 refills | Status: DC

## 2022-05-12 MED ORDER — DULOXETINE HCL 60 MG PO CPEP: 60.0000 mg | ORAL_CAPSULE | Freq: Every day | ORAL | 5 refills | Status: DC

## 2022-05-12 MED ORDER — TRAZODONE HCL 50 MG PO TABS: 100.0000 mg | ORAL_TABLET | Freq: Every day | ORAL | 7 refills | Status: DC

## 2022-05-12 MED ORDER — DESIPRAMINE HCL 25 MG PO TABS: 25.0000 mg | ORAL_TABLET | Freq: Every day | ORAL | 6 refills | Status: DC

## 2022-05-12 MED ORDER — HYDROXYZINE PAMOATE 50 MG PO CAPS: 100.0000 mg | ORAL_CAPSULE | Freq: Every day | ORAL | 3 refills | Status: DC

## 2022-05-12 NOTE — Telephone Encounter (Signed)
Spoke to patient.  She stated that she is in the donut hole and stiolto is not affordable.  She is questioning if there is a cheaper inhaler. She has attempted to call insurance to obtain list of alternatives but was not successful.  PA team, can you guys help with this?

## 2022-05-12 NOTE — Progress Notes (Signed)
The most painful.  This  Psychiatric Initial Adult Assessment   Patient Identification: Emily Moon MRN:  [id number] Date of Evaluation:  05/12/2022 Referral Source: From the community Chief Complaint: Feels exhausted      Today the patient seems to be relatively stable.  She is not quite as scattered.  I think she is more organized and does not seem to be as tense.  Patient went skydiving 71 to birthday.  She actually injured her left knee and that affects her walking.  The patient is seeing a new neurologist who wants to start a new medicine.  The patient's depression seems to be only mildly worse.  She feels really disconnected to her son.  Unfortunately she is not close to her grandchild.  Patient's energy she says is very low.  She also shares that she is supposed to get iron most likely because she is anemic.  General the patient sleeps only fairly well uses CPAP machine.  She is committed to taking Restoril 60 mg but says she will try to reduce it down to just taking a 30 mg single pill.  The patient is very cautious about being sick.  Since I have seen her she has been hospitalized 2 times for urinary tract sepsis.  Presently she has been placed on Ozempic for her diabetes.  The patient is in his wife living in [place].  She does not have regular contact with him.  At times she describes it as being avoidant of her.  The patient has 1 good friend who is in her late 6s.  The patient is not in therapy. Patient: Emily Moon  Procedure(s) Performed: * No surgery found *  Anesthes  Patient location:   Post pain:   Post assessment:   Last Vitals:  There were no vitals filed for this visit.  Post vital signs:   Level of consciousness:   Complications: . Associated Signs/Symptoms: Depression Symptoms:  hopelessness, (Hypo) Manic Symptoms:   Anxiety Symptoms:   Psychotic Symptoms:   PTSD Symptoms:   Past Psychiatric History: The patient was seen in a  psychiatric office and takes multiple medications she's not been recently hospitalized.  Previous Psychotropic Medications: Yes   Substance Abuse History in the last 12 months:  No.  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   History of kidney stones    Hypercholesterolemia    IDA (iron deficiency anemia)    Multiple sclerosis (HCC)    weakness   Osteoporosis    osteoarthritis   Pneumonia    PONV (postoperative nausea and vomiting)    no problem after cataract surgery   Prediabetes 07/22/2018   Psoriasis    Shortness of breath dyspnea    wheezing   Sleep apnea 2012   sleep study / slight, no interventions   Urinary  frequency    Weight gain 06/21/2014    Past Surgical History:  Procedure Laterality Date   CATARACT EXTRACTION W/PHACO Left 05/21/2015   Procedure: CATARACT EXTRACTION PHACO AND INTRAOCULAR LENS PLACEMENT (IOC);  Surgeon: Galen Manila, MD;  Location: [facility];  Service: Ophthalmology;  Laterality: Left;  [place] 00:35 AP% 22.9 CDE 8.11 fluid pack lot #[id number]   CATARACT EXTRACTION W/PHACO Right 06/04/2015   Procedure: CATARACT EXTRACTION PHACO AND INTRAOCULAR LENS PLACEMENT (IOC);  Surgeon: Galen Manila, MD;  Location: [facility];  Service: Ophthalmology;  Laterality: Right;  US:00:48 AP%: 10.5 CDE:5.08 Fluid lot #[id number]   CYSTOSCOPY/URETEROSCOPY/HOLMIUM LASER/STENT PLACEMENT Bilateral 09/22/2016    Procedure: CYSTOSCOPY/URETEROSCOPY/HOLMIUM LASER/STENT PLACEMENT;  Surgeon: Vanna Scotland, MD;  Location: [facility];  Service: Urology;  Laterality: Bilateral;   EYE SURGERY  2015   tissue biopsy   FOOT SURGERY  2015   JOINT REPLACEMENT Left 2013   hip replacement   LITHOTRIPSY     PTOSIS REPAIR Bilateral 02/18/2016   Procedure: BILATERAL PTOSIS REPAIR UPPER EYELIDS;  Surgeon: Imagene Riches, MD;  Location: [facility];  Service: Ophthalmology;  Laterality: Bilateral;  LEAVE PT EARLY AM   thumb surgery Right    TONSILLECTOMY  1973   TOTAL HIP ARTHROPLASTY Right 07/02/2021   Procedure: TOTAL HIP ARTHROPLASTY ANTERIOR APPROACH;  Surgeon: Ollen Gross, MD;  Location: [facility];  Service: Orthopedics;  Laterality: Right;    Family Psychiatric History:   Family History:  Family History  Problem Relation Age of Onset   Cancer Father        Abdomen with mastasis   Cancer Mother    Heart disease Mother    Kidney disease Neg Hx    Bladder Cancer Neg Hx    Prostate cancer Neg Hx    Kidney cancer Neg Hx     Social History:   Social History   Socioeconomic History   Marital status: Divorced    Spouse name: Not on file   Number of children: 1   Years of education: Not on file   Highest education level: Not on file  Occupational History   Occupation: Clinical biochemist Rep at [employer]    Employer: OTHER  Tobacco Use   Smoking status: Former    Packs/day: 1.00    Years: 25.00    Total pack years: 25.00    Types: Cigarettes    Quit date: 2019    Years since quitting: 4.4   Smokeless tobacco: Never   Tobacco comments:    occasional use  Vaping Use   Vaping Use: Former  Substance and Sexual Activity   Alcohol use: No    Alcohol/week: 0.0 standard drinks of alcohol   Drug use: Yes    Types: Methylphenidate, Marijuana    Comment: Delta 8 - once per day   Sexual activity: Never  Other Topics Concern   Not on file  Social History Narrative   Not on file   Social  Determinants of Health   Financial Resource Strain: Low Risk  (06/03/2021)   Overall Financial Resource Strain (CARDIA)    Difficulty of Paying Living Expenses: Not hard at all  Food Insecurity: No Food Insecurity (06/03/2021)   Hunger Vital Sign    Worried About Running Out of Food in the Last Year: Never true    Ran Out of Food in the Last Year: Never true  Transportation Needs: No Transportation Needs (06/03/2021)   PRAPARE - Administrator, Civil Service (Medical): No  Lack of Transportation (Non-Medical): No  Physical Activity: Inactive (06/03/2021)   Exercise Vital Sign    Days of Exercise per Week: 0 days    Minutes of Exercise per Session: 0 min  Stress: No Stress Concern Present (06/03/2021)   [employer] of Occupational Health - Occupational Stress Questionnaire    Feeling of Stress : Not at all  Social Connections: Not on file    Additional Social History:   Allergies:   Allergies  Allergen Reactions   Albuterol Shortness Of Breath and Other (See Comments)    Makes pt feel jittery/ tacycardic   Crestor [Rosuvastatin] Other (See Comments)    Joint pain, muscle pain, and hair loss   Halcion [Triazolam] Other (See Comments)    Dizziness,headaches,bladder problems   Levaquin [Levofloxacin In D5w] Diarrhea and Itching    Shoulder pain   Metformin And Related Rash   Naproxen Sodium     Patient tolerates in small doses. Her reaction is swelling of the lower extremities.    Cefaclor Other (See Comments)    Doesn't remember---unsure if actually allergic    Sulfa Antibiotics Itching    Unsure of reaction possibly itching   Tramadol Itching and Nausea And Vomiting   Zinc Other (See Comments)    constipation  constipation    Aripiprazole Other (See Comments)    Muscle tension/cramping   Diclofenac Sodium Rash    "made very sick"    Metabolic Disorder Labs: Lab Results  Component Value Date   HGBA1C 6.5 (A) 04/01/2022   MPG 142.72 07/02/2021   No  results found for: "PROLACTIN" Lab Results  Component Value Date   CHOL 159 03/10/2021   TRIG 189.0 (H) 03/10/2021   HDL 39.90 03/10/2021   CHOLHDL 4 03/10/2021   VLDL 37.8 03/10/2021   LDLCALC 81 03/10/2021   LDLCALC 223 (H) 11/13/2014     Current Medications: Current Outpatient Medications  Medication Sig Dispense Refill   aspirin EC 81 MG tablet Take 81 mg by mouth at bedtime. Swallow whole.     Azelastine HCl 137 MCG/SPRAY SOLN PLACE 2 SPRAYS INTO BOTH NOSTRILS 2 (TWO) TIMES DAILY 30 mL 5   B Complex-Biotin-FA (BIG 100, BIOTIN, PO) Take 300 mg by mouth at bedtime.     cetirizine (ZYRTEC) 10 MG tablet Take 10 mg by mouth 2 (two) times daily.     clindamycin (CLEOCIN T) 1 % external solution Apply to scalp once or twice a day prn bumps 30 mL 2   Continuous Blood Gluc Sensor (FREESTYLE LIBRE 3 SENSOR) MISC Place 1 sensor on the skin every 14 days. Use to check glucose continuously 6 each 1   cyanocobalamin (,VITAMIN B-12,) 1000 MCG/ML injection INJECT 1 ML (1,000 MCG TOTAL) INTO THE MUSCLE EVERY 30 (THIRTY) DAYS. FOR B12 VITAMIN 3 mL 0   desipramine (NORPRAMIN) 25 MG tablet Take 1 tablet (25 mg total) by mouth daily. 30 tablet 6   diphenhydrAMINE (BENADRYL) 2 % cream Apply topically 3 (three) times daily as needed for itching.     DULoxetine (CYMBALTA) 60 MG capsule Take 1 capsule (60 mg total) by mouth daily. 30 capsule 5   Evolocumab (REPATHA SURECLICK) 140 MG/ML SOAJ Inject 1 Dose into the skin every 14 (fourteen) days. 2 mL 3   fenofibrate (TRICOR) 145 MG tablet Take 1 tablet (145 mg total) by mouth daily. For cholesterol. 90 tablet 2   ferrous sulfate 325 (65 FE) MG tablet Take 1 tablet (325 mg total) by mouth 2 (two)  times daily with a meal. 180 tablet 0   hydrOXYzine (VISTARIL) 50 MG capsule Take 2 capsules (100 mg total) by mouth at bedtime. TAKE 1 CAPSLE BY MOUTH AT NOON, 1 AT 6 PM, AND 1 AS NEEDED 90 capsule 3   lamoTRIgine (LAMICTAL) 200 MG tablet 2 qhs 60 tablet 5   Lysine  500 MG TABS Take 2,000 mg by mouth daily.     methocarbamol (ROBAXIN) 500 MG tablet TAKE 1-2 TABLETS (500-1,000 MG TOTAL) BY MOUTH AT BEDTIME AS NEEDED FOR MUSCLE SPASMS. 180 tablet 0   metoprolol tartrate (LOPRESSOR) 50 MG tablet Take 1 tablet (50 mg total) by mouth 2 (two) times daily. 180 tablet 3   neomycin-bacitracin-polymyxin (NEOSPORIN) [phone] ointment Apply 1 application topically 4 (four) times daily as needed (for cut/scrapes.).     pantoprazole (PROTONIX) 40 MG tablet TAKE 1 TABLET (40 MG TOTAL) BY MOUTH 2 (TWO) TIMES DAILY BEFORE A MEAL. FOR HEARTBURN 180 tablet 3   polyvinyl alcohol (LIQUIFILM TEARS) 1.4 % ophthalmic solution Place 2 drops into both eyes at bedtime.     Ruxolitinib Phosphate (OPZELURA) 1.5 % CREA Apply topically.     Semaglutide, 1 MG/DOSE, 4 MG/3ML SOPN Inject 1 mg as directed once a week. for diabetes. 9 mL 0   STIOLTO RESPIMAT 2.5-2.5 MCG/ACT AERS Inhale 2 puffs into the lungs at bedtime.     temazepam (RESTORIL) 30 MG capsule TAKE ONE TO TWO CAPSULES BY MOUTH NIGHTLY AT BEDTIME 60 capsule 5   traZODone (DESYREL) 50 MG tablet Take 2 tablets (100 mg total) by mouth at bedtime. 60 tablet 7   valACYclovir (VALTREX) 1000 MG tablet Take 2 tablets by mouth twice daily for 1 day as needed for herpes outbreak. 12 tablet 0   vitamin C (ASCORBIC ACID) 500 MG tablet Take 500 mg by mouth 2 (two) times daily.     Vitamin D3 (VITAMIN D) 25 MCG tablet Take 3,000 Units by mouth daily.     No current facility-administered medications for this visit.    Neurologic: Headache: No Seizure: No Paresthesias:No  Musculoskeletal: Strength & Muscle Tone: within normal limits Gait & Station: normal Patient leans: N/A  Psychiatric Specialty Exam: ROS  Last menstrual period 08/23/2014.There is no height or weight on file to calculate BMI.  General Appearance: Casual  Eye Contact:  Good  Speech:  Clear and Coherent  Volume:  Normal  Mood:  Negative  Affect:  Appropriate   Thought Process:  Goal Directed  Orientation:  NA  Thought Content:  Logical  Suicidal Thoughts:  No  Homicidal Thoughts:  No  Memory:  Negative  Judgement:  Good  Insight:  Good  Psychomotor Activity:  Normal  Concentration:    Recall:    Fund of Knowledge:Good  Language: Good  Akathisia:  No  Handed:  Right  AIMS (if indicated):    Assets:  Desire for Improvement  ADL's:  Intact  Cognition: WNL  Sleep:      6/27/20233:34 PM      This patient's diagnosis is that most likely a bipolar disorder.  Today we are going to increase her Lamictal to a full dose of 400 mg.  She will continue taking relatively low dose of desipramine but today we will increase her Cymbalta from a dose of 40 mg to 60 mg.  She will continue taking Vistaril.  Her second problem is that of insomnia.  She will take trazodone 100 mg for this she is prescribed Restoril 30 mg 2 of  them but she just could not try to take 1.  Her next visit we will discuss options of therapy.  I think overall the patient is reasonably stable.  In the past when we increased the Cymbalta she says she would get somewhat agitated but hopefully with the higher dose of Lamictal that will not happen.

## 2022-05-13 ENCOUNTER — Other Ambulatory Visit (HOSPITAL_COMMUNITY): Payer: Self-pay

## 2022-05-13 ENCOUNTER — Encounter: Payer: Self-pay | Admitting: Oncology

## 2022-05-13 NOTE — Telephone Encounter (Signed)
Called to offer patient assistance to the patient but she did not answer. Left message for her to call us back.

## 2022-05-14 ENCOUNTER — Ambulatory Visit (INDEPENDENT_AMBULATORY_CARE_PROVIDER_SITE_OTHER): Payer: PPO | Admitting: Primary Care

## 2022-05-14 ENCOUNTER — Ambulatory Visit (INDEPENDENT_AMBULATORY_CARE_PROVIDER_SITE_OTHER)
Admission: RE | Admit: 2022-05-14 | Discharge: 2022-05-14 | Disposition: A | Discharge status: Home / Self Care | Payer: PPO | Source: Ambulatory Visit | Attending: Primary Care | Admitting: Primary Care

## 2022-05-14 ENCOUNTER — Encounter: Payer: Self-pay | Admitting: Primary Care

## 2022-05-14 VITALS — BP 100/80 | HR 100 | Temp 98.6°F | Ht 69.0 in | Wt 229.0 lb

## 2022-05-14 DIAGNOSIS — R300 Dysuria: Secondary | ICD-10-CM | POA: Diagnosis not present

## 2022-05-14 DIAGNOSIS — D509 Iron deficiency anemia, unspecified: Secondary | ICD-10-CM

## 2022-05-14 DIAGNOSIS — R109 Unspecified abdominal pain: Secondary | ICD-10-CM

## 2022-05-14 LAB — BASIC METABOLIC PANEL
BUN: 25 mg/dL — ABNORMAL HIGH (ref 6–23)
CO2: 27 mEq/L (ref 19–32)
Calcium: 9.2 mg/dL (ref 8.4–10.5)
Chloride: 102 mEq/L (ref 96–112)
Creatinine, Ser: 1.09 mg/dL (ref 0.40–1.20)
GFR: 55.38 mL/min — ABNORMAL LOW (ref >60.00)
Glucose, Bld: 105 mg/dL — ABNORMAL HIGH (ref 70–99)
Potassium: 4.5 mEq/L (ref 3.5–5.1)
Sodium: 138 mEq/L (ref 135–145)

## 2022-05-14 LAB — CBC WITH DIFFERENTIAL/PLATELET
Basophils Absolute: 0.1 10*3/uL (ref 0.0–0.1)
Basophils Relative: 0.8 % (ref 0.0–3.0)
Eosinophils Absolute: 0.4 10*3/uL (ref 0.0–0.7)
Eosinophils Relative: 4 % (ref 0.0–5.0)
HCT: 38.4 % (ref 36.0–46.0)
Hemoglobin: 12.8 g/dL (ref 12.0–15.0)
Lymphocytes Relative: 25.7 % (ref 12.0–46.0)
Lymphs Abs: 2.3 10*3/uL (ref 0.7–4.0)
MCHC: 33.3 g/dL (ref 30.0–36.0)
MCV: 94.7 fl (ref 78.0–100.0)
Monocytes Absolute: 0.7 10*3/uL (ref 0.1–1.0)
Monocytes Relative: 8.2 % (ref 3.0–12.0)
Neutro Abs: 5.4 10*3/uL (ref 1.4–7.7)
Neutrophils Relative %: 61.3 % (ref 43.0–77.0)
Platelets: 439 10*3/uL — ABNORMAL HIGH (ref 150.0–400.0)
RBC: 4.06 Mil/uL (ref 3.87–5.11)
RDW: 12.9 % (ref 11.5–15.5)
WBC: 8.8 10*3/uL (ref 4.0–10.5)

## 2022-05-14 LAB — POC URINALSYSI DIPSTICK (AUTOMATED)
Bilirubin, UA: POSITIVE
Blood, UA: POSITIVE
Glucose, UA: NEGATIVE
Ketones, UA: POSITIVE
Nitrite, UA: NEGATIVE
Protein, UA: POSITIVE — AB
Spec Grav, UA: 1.025 (ref 1.010–1.025)
Urobilinogen, UA: 0.2 E.U./dL
pH, UA: 6 (ref 5.0–8.0)

## 2022-05-14 LAB — IBC + FERRITIN
Ferritin: 101.9 ng/mL (ref 10.0–291.0)
Iron: 95 ug/dL (ref 42–145)
Saturation Ratios: 19 % — ABNORMAL LOW (ref 20.0–50.0)
TIBC: 499.8 ug/dL — ABNORMAL HIGH (ref 250.0–450.0)
Transferrin: 357 mg/dL (ref 212.0–360.0)

## 2022-05-14 MED ORDER — AMOXICILLIN 500 MG PO CAPS: 500.0000 mg | ORAL_CAPSULE | Freq: Three times a day (TID) | ORAL | 0 refills | Status: AC

## 2022-05-14 NOTE — Progress Notes (Signed)
Subjective:    Patient ID: Emily Moon, female    DOB: [birth year], 61 y.o.   MRN: [id number]  HPI  Emily Moon is a very pleasant 61 y.o. female with a history of multiple sclerosis, OSA, COPD with asthma, type 2 diabetes, osteoarthritis, bipolar disorder, hyperlipidemia, pyelonephritis, urosepsis, iron deficiency anemia recurrent renal stones who presents today urinary frequency.  She also reports incomplete bladder emptying, fevers (99.0), bilateral lower back pain, difficulty urinating that began 2-3 days ago. She's been taking Ibuprofen for back pain and fevers.   Follows with Urology for recurrent UTI's and recurrent renal stones, pyelonephritis with sepsis. She contacted her Urologist's office yesterday and was unable to get in for evaluation.   Admitted for sepsis with acute pyelonephritis in May 2023 (and also February 2023), E. Coli positive culture, treated with IV Rocephin, discharged home with Bactrim DS tablets for 7 days. She did not follow up with PCP or Urology post discharge, she did complete her antibiotics. She did feel better a few days after discharge.   She is also due for repeat iron testing.    Review of Systems  Constitutional:  Positive for fever.  Gastrointestinal:  Negative for abdominal pain.  Genitourinary:  Positive for dysuria, frequency and urgency. Negative for hematuria and vaginal discharge.  Musculoskeletal:  Positive for back pain.         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   History of kidney stones    Hypercholesterolemia    IDA (iron deficiency anemia)    Multiple sclerosis (HCC)    weakness   Osteoporosis    osteoarthritis   Pneumonia    PONV (postoperative nausea and vomiting)    no problem after cataract surgery   Prediabetes 07/22/2018   Psoriasis    Shortness of breath dyspnea    wheezing   Sleep apnea 2012   sleep study / slight, no interventions   Urinary frequency    Weight gain 06/21/2014    Social History   Socioeconomic History   Marital status: Divorced    Spouse name: Not on file   Number of children: 1   Years of education: Not on file   Highest education level: Not on file  Occupational History   Occupation: Therapist, 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  Tobacco Use   Smoking status: Former    Packs/day: 1.00    Years: 25.00    Total pack years: 25.00    Types: Cigarettes    Quit date: 2019    Years since quitting: 4.4   Smokeless tobacco: Never   Tobacco comments:    occasional use  Vaping Use   Vaping Use: Former  Substance and Sexual Activity   Alcohol use: No    Alcohol/week: 0.0 standard drinks of alcohol   Drug use: Yes    Types: Methylphenidate,  Marijuana    Comment: Delta 8 - once per day   Sexual activity: Never  Other Topics Concern   Not on file  Social History Narrative   Not on file   Social Determinants of Health   Financial Resource Strain: Low Risk  (06/03/2021)   Overall Financial Resource Strain (CARDIA)    Difficulty of Paying Living Expenses: Not hard at all  Food Insecurity: No Food Insecurity (06/03/2021)   Hunger Vital Sign    Worried About Running Out of Food in the Last Year: Never true    Ran Out of Food in the Last Year: Never true  Transportation Needs: No Transportation Needs (06/03/2021)   PRAPARE - Hydrologist (Medical): No    Lack of Transportation (Non-Medical): No  Physical Activity: Inactive  (06/03/2021)   Exercise Vital Sign    Days of Exercise per Week: 0 days    Minutes of Exercise per Session: 0 min  Stress: No Stress Concern Present (06/03/2021)   [place]    Feeling of Stress : Not at all  Social Connections: Not on file  Intimate Partner Violence: Not At Risk (06/03/2021)   Humiliation, Afraid, Rape, and Kick questionnaire    Fear of Current or Ex-Partner: No    Emotionally Abused: No    Physically Abused: No    Sexually Abused: No    Past Surgical History:  Procedure Laterality Date   CATARACT EXTRACTION W/PHACO Left 05/21/2015   Procedure: CATARACT EXTRACTION PHACO AND INTRAOCULAR LENS PLACEMENT ([place]);  Surgeon: Birder Robson, MD;  Location: [facility];  Service: Ophthalmology;  Laterality: Left;  [place] 00:35 AP% 22.9 CDE 8.11 fluid pack lot #[id number]   CATARACT EXTRACTION W/PHACO Right 06/04/2015   Procedure: CATARACT EXTRACTION PHACO AND INTRAOCULAR LENS PLACEMENT ([facility]);  Surgeon: Birder Robson, MD;  Location: [facility];  Service: Ophthalmology;  Laterality: Right;  US:00:48 AP%: 10.5 CDE:5.08 Fluid lot #[id number]   CYSTOSCOPY/URETEROSCOPY/HOLMIUM LASER/STENT PLACEMENT Bilateral 09/22/2016   Procedure: CYSTOSCOPY/URETEROSCOPY/HOLMIUM LASER/STENT PLACEMENT;  Surgeon: Hollice Espy, MD;  Location: [facility];  Service: Urology;  Laterality: Bilateral;   EYE SURGERY  2015   tissue biopsy   FOOT SURGERY  2015   JOINT REPLACEMENT Left 2013   hip replacement   LITHOTRIPSY     PTOSIS REPAIR Bilateral 02/18/2016   Procedure: BILATERAL PTOSIS REPAIR UPPER EYELIDS;  Surgeon: Karle Starch, MD;  Location: [facility];  Service: Ophthalmology;  Laterality: Bilateral;  LEAVE PT EARLY AM   thumb surgery Right    TONSILLECTOMY  1973   TOTAL HIP ARTHROPLASTY Right 07/02/2021   Procedure: TOTAL HIP ARTHROPLASTY ANTERIOR APPROACH;  Surgeon: Gaynelle Arabian, MD;  Location: [facility];  Service: Orthopedics;   Laterality: Right;    Family History  Problem Relation Age of Onset   Cancer Father        Abdomen with mastasis   Cancer Mother    Heart disease Mother    Kidney disease Neg Hx    Bladder Cancer Neg Hx    Prostate cancer Neg Hx    Kidney cancer Neg Hx     Allergies  Allergen Reactions   Albuterol Shortness Of Breath and Other (See Comments)    Makes pt feel jittery/ tacycardic   Crestor [Rosuvastatin] Other (See Comments)    Joint pain, muscle pain, and hair loss   Halcion [Triazolam] Other (See Comments)    Dizziness,headaches,bladder problems   Levaquin [Levofloxacin In D5w]  Diarrhea and Itching    Shoulder pain   Metformin And Related Rash   Naproxen Sodium     Patient tolerates in small doses. Her reaction is swelling of the lower extremities.    Cefaclor Other (See Comments)    Doesn't remember---unsure if actually allergic    Sulfa Antibiotics Itching    Unsure of reaction possibly itching   Tramadol Itching and Nausea And Vomiting   Zinc Other (See Comments)    constipation  constipation    Aripiprazole Other (See Comments)    Muscle tension/cramping   Diclofenac Sodium Rash    "made very sick"    Current Outpatient Medications on File Prior to Visit  Medication Sig Dispense Refill   aspirin EC 81 MG tablet Take 81 mg by mouth at bedtime. Swallow whole.     Azelastine HCl 137 MCG/SPRAY SOLN PLACE 2 SPRAYS INTO BOTH NOSTRILS 2 (TWO) TIMES DAILY 30 mL 5   B Complex-Biotin-FA (BIG 100, BIOTIN, PO) Take 300 mg by mouth at bedtime.     cetirizine (ZYRTEC) 10 MG tablet Take 10 mg by mouth 2 (two) times daily.     clindamycin (CLEOCIN T) 1 % external solution Apply to scalp once or twice a day prn bumps 30 mL 2   Continuous Blood Gluc Sensor (FREESTYLE LIBRE 3 SENSOR) MISC Place 1 sensor on the skin every 14 days. Use to check glucose continuously 6 each 1   cyanocobalamin (,VITAMIN B-12,) 1000 MCG/ML injection INJECT 1 ML (1,000 MCG TOTAL) INTO THE MUSCLE EVERY 30  (THIRTY) DAYS. FOR B12 VITAMIN 3 mL 0   desipramine (NORPRAMIN) 25 MG tablet Take 1 tablet (25 mg total) by mouth daily. 30 tablet 6   diphenhydrAMINE (BENADRYL) 2 % cream Apply topically 3 (three) times daily as needed for itching.     DULoxetine (CYMBALTA) 60 MG capsule Take 1 capsule (60 mg total) by mouth daily. 30 capsule 5   Evolocumab (REPATHA SURECLICK) 536 MG/ML SOAJ Inject 1 Dose into the skin every 14 (fourteen) days. 2 mL 3   fenofibrate (TRICOR) 145 MG tablet Take 1 tablet (145 mg total) by mouth daily. For cholesterol. 90 tablet 2   ferrous sulfate 325 (65 FE) MG tablet Take 1 tablet (325 mg total) by mouth 2 (two) times daily with a meal. 180 tablet 0   hydrOXYzine (VISTARIL) 50 MG capsule Take 2 capsules (100 mg total) by mouth at bedtime. TAKE 1 CAPSLE BY MOUTH AT NOON, 1 AT 6 PM, AND 1 AS NEEDED 90 capsule 3   lamoTRIgine (LAMICTAL) 200 MG tablet 2 qhs 60 tablet 5   Lysine 500 MG TABS Take 2,000 mg by mouth daily.     methocarbamol (ROBAXIN) 500 MG tablet TAKE 1-2 TABLETS (500-1,000 MG TOTAL) BY MOUTH AT BEDTIME AS NEEDED FOR MUSCLE SPASMS. 180 tablet 0   neomycin-bacitracin-polymyxin (NEOSPORIN) [phone] ointment Apply 1 application topically 4 (four) times daily as needed (for cut/scrapes.).     pantoprazole (PROTONIX) 40 MG tablet TAKE 1 TABLET (40 MG TOTAL) BY MOUTH 2 (TWO) TIMES DAILY BEFORE A MEAL. FOR HEARTBURN 180 tablet 3   polyvinyl alcohol (LIQUIFILM TEARS) 1.4 % ophthalmic solution Place 2 drops into both eyes at bedtime.     Ruxolitinib Phosphate (OPZELURA) 1.5 % CREA Apply topically.     Semaglutide, 1 MG/DOSE, 4 MG/3ML SOPN Inject 1 mg as directed once a week. for diabetes. 9 mL 0   temazepam (RESTORIL) 30 MG capsule TAKE ONE TO TWO CAPSULES BY MOUTH  NIGHTLY AT BEDTIME 60 capsule 5   traZODone (DESYREL) 50 MG tablet Take 2 tablets (100 mg total) by mouth at bedtime. 60 tablet 7   valACYclovir (VALTREX) 1000 MG tablet Take 2 tablets by mouth twice daily for 1 day  as needed for herpes outbreak. 12 tablet 0   vitamin C (ASCORBIC ACID) 500 MG tablet Take 500 mg by mouth 2 (two) times daily.     Vitamin D3 (VITAMIN D) 25 MCG tablet Take 3,000 Units by mouth daily.     metoprolol tartrate (LOPRESSOR) 50 MG tablet Take 1 tablet (50 mg total) by mouth 2 (two) times daily. 180 tablet 3   STIOLTO RESPIMAT 2.5-2.5 MCG/ACT AERS Inhale 2 puffs into the lungs at bedtime. (Patient not taking: Reported on 05/14/2022)     No current facility-administered medications on file prior to visit.    BP 100/80   Pulse 100   Temp 98.6 F (37 C) (Oral)   Ht '5\' 9"'$  (1.753 m)   Wt 229 lb (103.9 kg)   LMP 08/23/2014   SpO2 98%   BMI 33.82 kg/m  Objective:   Physical Exam Constitutional:      General: She is not in acute distress.    Appearance: She is not ill-appearing.  Cardiovascular:     Rate and Rhythm: Normal rate and regular rhythm.  Pulmonary:     Effort: Pulmonary effort is normal.     Breath sounds: Normal breath sounds.  Abdominal:     Palpations: Abdomen is soft.     Tenderness: There is no abdominal tenderness. There is no right CVA tenderness or left CVA tenderness.  Musculoskeletal:     Cervical back: Neck supple.  Skin:    General: Skin is warm and dry.           Assessment & Plan:   Problem List Items Addressed This Visit       Other   Dysuria - Primary    Suspicious for cystitis, especially given her history. Also consider renal stone.   UA today with 1+ blood, leuks. Culture sent. Checking labs today including CBC with diff, BMP. Checking KUB xray.  Given history, coupled with symptoms and UA results, will treat. Rx for Amoxil 500 mg TID x 7 days sent to pharmacy. Strict return/hospital precautions provided.  Currently, she appears stable for outpatient treatment       Relevant Medications   amoxicillin (AMOXIL) 500 MG capsule   Other Relevant Orders   Basic metabolic panel   CBC with Differential/Platelet   POCT  Urinalysis Dipstick (Automated)   Urine Culture   DG Abd 1 View   Iron deficiency anemia   Relevant Orders   IBC + Ferritin   Other Visit Diagnoses     Flank pain       Relevant Orders   DG Abd 1 View          [name], NP

## 2022-05-14 NOTE — Patient Instructions (Addendum)
Stop by the lab and xray prior to leaving today. I will notify you of your results once received.   Start amoxicillin antibiotics for the infection. Take 1 tablet by mouth three times daily for 7 days.  Go to the hospital if no improvement.  It was a pleasure to see you today!

## 2022-05-14 NOTE — Assessment & Plan Note (Addendum)
Suspicious for cystitis, especially given her history. Also consider renal stone.   UA today with 1+ blood, leuks. Culture sent. Checking labs today including CBC with diff, BMP. Checking KUB xray.  Given history, coupled with symptoms and UA results, will treat. Rx for Amoxil 500 mg TID x 7 days sent to pharmacy. This hisorically works well. She also has a history of QT prolongation so will avoid Bactrim and fluoquinolones.  Strict return/hospital precautions provided.   Currently, she appears stable for outpatient treatment

## 2022-05-14 NOTE — Assessment & Plan Note (Signed)
Recently finished oral iron course. Repeat IBC and ferritin pending.

## 2022-05-15 LAB — URINE CULTURE
MICRO NUMBER:: 13589075
SPECIMEN QUALITY:: ADEQUATE

## 2022-05-18 ENCOUNTER — Other Ambulatory Visit: Payer: Self-pay | Admitting: *Deleted

## 2022-05-18 MED ORDER — REPATHA SURECLICK 140 MG/ML ~~LOC~~ SOAJ: 1.0000 | SUBCUTANEOUS | 5 refills | Status: DC

## 2022-05-18 NOTE — Telephone Encounter (Signed)
Called and spoke with pt letting her know the info from pharmacy team about inhalers and she verbalized understanding. Nothing further needed.

## 2022-05-21 DIAGNOSIS — S83412A Sprain of medial collateral ligament of left knee, initial encounter: Secondary | ICD-10-CM | POA: Diagnosis not present

## 2022-05-26 NOTE — Progress Notes (Signed)
[place]  [phone] Fax:(336) [phone]  ID: [name] OB: 03/12/1961  MR#: [id number]  QZE#:[id number]  Patient Care Team: [name], NP as PCP - General (Internal Medicine) [name] Sable, MD as PCP - Cardiology (Cardiology) Birder Robson, MD as Referring Physician (Ophthalmology) [name], [facility] as Pharmacist (Pharmacist) Lloyd Huger, MD as Consulting Physician (Hematology and Oncology)  CHIEF COMPLAINT: Iron deficiency anemia.  INTERVAL HISTORY: Patient returns to clinic today for repeat laboratory work, further evaluation, and continuation of Venofer if needed.  She currently feels well and is asymptomatic.  She continues to have chronic weakness and fatigue. She has no neurologic complaints.  She denies any recent fevers or illnesses.  She has a good appetite and denies weight loss.  She has no chest pain, shortness of breath, cough, or hemoptysis.  She denies any nausea, vomiting, constipation, or diarrhea.  She has no melena or hematochezia.  She has no urinary complaints.  Patient offers no further specific complaints today.  REVIEW OF SYSTEMS:   Review of Systems  Constitutional:  Positive for malaise/fatigue. Negative for fever and weight loss.  Respiratory: Negative.  Negative for cough, hemoptysis and shortness of breath.   Cardiovascular: Negative.  Negative for chest pain and leg swelling.  Gastrointestinal: Negative.  Negative for abdominal pain, blood in stool and melena.  Genitourinary: Negative.  Negative for hematuria.  Musculoskeletal: Negative.  Negative for back pain.  Skin: Negative.  Negative for rash.  Neurological:  Positive for weakness. Negative for dizziness, focal weakness and headaches.  Psychiatric/Behavioral: Negative.  The patient is not nervous/anxious.     As per HPI. Otherwise, a complete review of systems is negative.  PAST MEDICAL HISTORY: Past Medical History:   Diagnosis Date   Arthritis    osteo   Asthma    Bipolar disorder ([place]) 05/21/2014   Cataracts, bilateral    COPD (chronic obstructive pulmonary disease) (HCC)    DDD (degenerative disc disease), cervical    also back   Depression    Diabetes mellitus without complication (HCC)    Dizziness    Positional   Edema    feet/legs   Fibromyalgia syndrome    Fungal infection    Finger nails   GERD (gastroesophageal reflux disease)    Gout    Heart palpitations    Hip dysplasia, congenital 09/15/2013   History of kidney stones    Hypercholesterolemia    IDA (iron deficiency anemia)    Multiple sclerosis (HCC)    weakness   Osteoporosis    osteoarthritis   Pneumonia    PONV (postoperative nausea and vomiting)    no problem after cataract surgery   Prediabetes 07/22/2018   Psoriasis    Shortness of breath dyspnea    wheezing   Sleep apnea 2012   sleep study / slight, no interventions   Urinary frequency    Weight gain 06/21/2014    PAST SURGICAL HISTORY: Past Surgical History:  Procedure Laterality Date   CATARACT EXTRACTION W/PHACO Left 05/21/2015   Procedure: CATARACT EXTRACTION PHACO AND INTRAOCULAR LENS PLACEMENT ([facility]);  Surgeon: Birder Robson, MD;  Location: [facility] ORS;  Service: Ophthalmology;  Laterality: Left;  [place] 00:35 AP% 22.9 CDE 8.11 fluid pack lot #[id number]   CATARACT EXTRACTION W/PHACO Right 06/04/2015   Procedure: CATARACT EXTRACTION PHACO AND INTRAOCULAR LENS PLACEMENT ([facility]);  Surgeon: Birder Robson, MD;  Location: [facility] OR[facility];  Service: Ophthalmology;  Laterality: Right;  US:00:48 AP%: 10.5 CDE:5.08 Fluid lot #[id number]  CYSTOSCOPY/URETEROSCOPY/HOLMIUM LASER/STENT PLACEMENT Bilateral 09/22/2016   Procedure: CYSTOSCOPY/URETEROSCOPY/HOLMIUM LASER/STENT PLACEMENT;  Surgeon: Hollice Espy, MD;  Location: [facility] ORS;  Service: Urology;  Laterality: Bilateral;   EYE SURGERY  2015   tissue biopsy   FOOT SURGERY  2015   JOINT REPLACEMENT Left 2013   hip  replacement   LITHOTRIPSY     PTOSIS REPAIR Bilateral 02/18/2016   Procedure: BILATERAL PTOSIS REPAIR UPPER EYELIDS;  Surgeon: Karle Starch, MD;  Location: [place];  Service: Ophthalmology;  Laterality: Bilateral;  LEAVE PT EARLY AM   thumb surgery Right    TONSILLECTOMY  1973   TOTAL HIP ARTHROPLASTY Right 07/02/2021   Procedure: TOTAL HIP ARTHROPLASTY ANTERIOR APPROACH;  Surgeon: Gaynelle Arabian, MD;  Location: [facility];  Service: Orthopedics;  Laterality: Right;    FAMILY HISTORY: Family History  Problem Relation Age of Onset   Cancer Father        Abdomen with mastasis   Cancer Mother    Heart disease Mother    Kidney disease Neg Hx    Bladder Cancer Neg Hx    Prostate cancer Neg Hx    Kidney cancer Neg Hx     ADVANCED DIRECTIVES (Y/N):  N  HEALTH MAINTENANCE: Social History   Tobacco Use   Smoking status: Former    Packs/day: 1.00    Years: 25.00    Total pack years: 25.00    Types: Cigarettes    Quit date: 2019    Years since quitting: 4.5   Smokeless tobacco: Never   Tobacco comments:    occasional use  Vaping Use   Vaping Use: Former  Substance Use Topics   Alcohol use: No    Alcohol/week: 0.0 standard drinks of alcohol   Drug use: Yes    Types: Methylphenidate, 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 Her reaction is swelling of the lower extremities.    Cefaclor Other (See Comments)    Doesn't remember---unsure if actually allergic     Sulfa Antibiotics Itching    Unsure of reaction possibly itching   Tramadol Itching and Nausea And Vomiting   Zinc Other (See Comments)    constipation  constipation    Aripiprazole Other (See Comments)    Muscle tension/cramping   Diclofenac Sodium Rash    "made very sick"    Current Outpatient Medications  Medication Sig Dispense Refill   aspirin EC 81 MG tablet Take 81 mg by mouth at bedtime. Swallow whole.     Azelastine HCl 137 MCG/SPRAY SOLN PLACE 2 SPRAYS INTO BOTH NOSTRILS 2 (TWO) TIMES DAILY 30 mL 5   B Complex-Biotin-FA (BIG 100, BIOTIN, PO) Take 300 mg by mouth at bedtime.     cetirizine (ZYRTEC) 10 MG tablet Take 10 mg by mouth 2 (two) times daily.     clindamycin (CLEOCIN T) 1 % external solution Apply to scalp once or twice a day prn  bumps 30 mL 2   Continuous Blood Gluc Sensor (FREESTYLE LIBRE 3 SENSOR) MISC Place 1 sensor on the skin every 14 days. Use to check glucose continuously 6 each 1   cyanocobalamin (,VITAMIN B-12,) 1000 MCG/ML injection INJECT 1 ML (1,000 MCG TOTAL) INTO THE MUSCLE EVERY 30 (THIRTY) DAYS. FOR B12 VITAMIN 3 mL 0   desipramine (NORPRAMIN) 25 MG tablet Take 1 tablet (25 mg total) by mouth daily. 30 tablet 6   diphenhydrAMINE (BENADRYL) 2 % cream Apply topically 3 (three) times daily as needed for itching.     DULoxetine (CYMBALTA) 60 MG capsule Take 1 capsule (60 mg total) by mouth daily. 30 capsule 5   Evolocumab (REPATHA SURECLICK) 694 MG/ML SOAJ Inject 1 Dose into the skin every 14 (fourteen) days. 2 mL 5   fenofibrate (TRICOR) 145 MG tablet Take 1 tablet (145 mg total) by mouth daily. For cholesterol. 90 tablet 2   ferrous sulfate 325 (65 FE) MG tablet Take 1 tablet (325 mg total) by mouth 2 (two) times daily with a meal. 180 tablet 0   hydrOXYzine (VISTARIL) 50 MG capsule Take 2 capsules (100 mg total) by mouth at bedtime. TAKE 1 CAPSLE BY MOUTH AT NOON, 1 AT 6 PM, AND 1 AS NEEDED 90 capsule 3   lamoTRIgine (LAMICTAL) 200 MG tablet 2 qhs 60  tablet 5   Lysine 500 MG TABS Take 2,000 mg by mouth daily.     methocarbamol (ROBAXIN) 500 MG tablet TAKE 1-2 TABLETS (500-1,000 MG TOTAL) BY MOUTH AT BEDTIME AS NEEDED FOR MUSCLE SPASMS. 180 tablet 0   neomycin-bacitracin-polymyxin (NEOSPORIN) [phone] ointment Apply 1 application topically 4 (four) times daily as needed (for cut/scrapes.).     pantoprazole (PROTONIX) 40 MG tablet TAKE 1 TABLET (40 MG TOTAL) BY MOUTH 2 (TWO) TIMES DAILY BEFORE A MEAL. FOR HEARTBURN 180 tablet 3   polyvinyl alcohol (LIQUIFILM TEARS) 1.4 % ophthalmic solution Place 2 drops into both eyes at bedtime.     Ruxolitinib Phosphate (OPZELURA) 1.5 % CREA Apply topically.     Semaglutide, 1 MG/DOSE, 4 MG/3ML SOPN Inject 1 mg as directed once a week. for diabetes. 9 mL 0   STIOLTO RESPIMAT 2.5-2.5 MCG/ACT AERS Inhale 2 puffs into the lungs at bedtime.     temazepam (RESTORIL) 30 MG capsule TAKE ONE TO TWO CAPSULES BY MOUTH NIGHTLY AT BEDTIME 60 capsule 5   traZODone (DESYREL) 50 MG tablet Take 2 tablets (100 mg total) by mouth at bedtime. 60 tablet 7   valACYclovir (VALTREX) 1000 MG tablet Take 2 tablets by mouth twice daily for 1 day as needed for herpes outbreak. 12 tablet 0   vitamin C (ASCORBIC ACID) 500 MG tablet Take 500 mg by mouth 2 (two) times daily.     Vitamin D3 (VITAMIN D) 25 MCG tablet Take 3,000 Units by mouth daily.     metoprolol tartrate (LOPRESSOR) 50 MG tablet Take 1 tablet (50 mg total) by mouth 2 (two) times daily. 180 tablet 3   No current facility-administered medications for this visit.    OBJECTIVE: Vitals:   05/28/22 1357  BP: 107/63  Pulse: 87  Resp: 18  Temp: (!) 97 F (36.1 C)  SpO2: 97%     Body mass index is 34.11 kg/m.    ECOG FS:0 - Asymptomatic  General: Well-developed, well-nourished, no acute distress. Eyes: Pink conjunctiva, anicteric sclera. HEENT: Normocephalic, moist mucous membranes. Lungs: No audible wheezing or coughing. Heart: Regular rate and rhythm. Abdomen:  Soft, nontender,  no obvious distention. Musculoskeletal: No edema, cyanosis, or clubbing. Neuro: Alert, answering all questions appropriately. Cranial nerves grossly intact. Skin: No rashes or petechiae noted. Psych: Normal affect.  LAB RESULTS:  Lab Results  Component Value Date   NA 138 05/14/2022   K 4.5 05/14/2022   CL 102 05/14/2022   CO2 27 05/14/2022   GLUCOSE 105 (H) 05/14/2022   BUN 25 (H) 05/14/2022   CREATININE 1.09 05/14/2022   CALCIUM 9.2 05/14/2022   PROT 7.3 12/24/2021   ALBUMIN 3.8 12/24/2021   AST 22 12/24/2021   ALT 20 12/24/2021   ALKPHOS 55 12/24/2021   BILITOT 0.7 12/24/2021   GFRNONAA >60 03/23/2022   GFRAA >60 02/19/2020    Lab Results  Component Value Date   WBC 7.9 05/28/2022   NEUTROABS 4.9 05/28/2022   HGB 12.8 05/28/2022   HCT 38.0 05/28/2022   MCV 95.0 05/28/2022   PLT 431 (H) 05/28/2022   Lab Results  Component Value Date   IRON 92 05/28/2022   TIBC 484 (H) 05/28/2022   IRONPCTSAT 19 05/28/2022   Lab Results  Component Value Date   FERRITIN 113 05/28/2022     STUDIES: DG Abd 1 View  Result Date: 05/15/2022 CLINICAL DATA:  Dysuria, flank pain, kidney stones EXAM: ABDOMEN - 1 VIEW COMPARISON:  Prior CT scan of the abdomen and pelvis 03/23/2022 FINDINGS: The bowel gas pattern is normal. No radio-opaque calculi or other significant radiographic abnormality are seen. Partially imaged bilateral hip arthroplasty prostheses. IMPRESSION: Negative. Electronically Signed   By: Jacqulynn Cadet M.D.   On: 05/15/2022 15:22    ASSESSMENT: Iron deficiency anemia.  PLAN:    Iron deficiency anemia: Patient's hemoglobin and iron stores are now within normal limits.  She reports constipation with oral iron supplementation.  No intervention is needed at this time.  Patient does not require additional IV Venofer.  She last received treatment on February 05, 2022.  Return to clinic in 4 months with repeat laboratory work, further evaluation, and  continuation of treatment if needed.  Thrombocytosis: Improved.  Monitor.   Leukocytosis: Resolved.  I spent a total of 20 minutes reviewing chart data, face-to-face evaluation with the patient, counseling and coordination of care as detailed above.   Patient expressed understanding and was in agreement with this plan. She also understands that She can call clinic at any time with any questions, concerns, or complaints.    Lloyd Huger, MD   05/29/2022 1:34 PM

## 2022-05-27 ENCOUNTER — Other Ambulatory Visit: Payer: Self-pay | Admitting: *Deleted

## 2022-05-27 DIAGNOSIS — D509 Iron deficiency anemia, unspecified: Secondary | ICD-10-CM

## 2022-05-28 ENCOUNTER — Inpatient Hospital Stay: Payer: PPO | Attending: Oncology

## 2022-05-28 ENCOUNTER — Encounter: Payer: Self-pay | Admitting: Oncology

## 2022-05-28 ENCOUNTER — Inpatient Hospital Stay: Payer: PPO

## 2022-05-28 ENCOUNTER — Inpatient Hospital Stay (HOSPITAL_BASED_OUTPATIENT_CLINIC_OR_DEPARTMENT_OTHER): Payer: PPO | Admitting: Oncology

## 2022-05-28 VITALS — BP 107/63 | HR 87 | Temp 97.0°F | Resp 18 | Wt 231.0 lb

## 2022-05-28 DIAGNOSIS — E119 Type 2 diabetes mellitus without complications: Secondary | ICD-10-CM | POA: Insufficient documentation

## 2022-05-28 DIAGNOSIS — Z809 Family history of malignant neoplasm, unspecified: Secondary | ICD-10-CM | POA: Insufficient documentation

## 2022-05-28 DIAGNOSIS — Z800 Family history of malignant neoplasm of digestive organs: Secondary | ICD-10-CM | POA: Diagnosis not present

## 2022-05-28 DIAGNOSIS — R5383 Other fatigue: Secondary | ICD-10-CM | POA: Diagnosis not present

## 2022-05-28 DIAGNOSIS — G4730 Sleep apnea, unspecified: Secondary | ICD-10-CM | POA: Diagnosis not present

## 2022-05-28 DIAGNOSIS — D509 Iron deficiency anemia, unspecified: Secondary | ICD-10-CM

## 2022-05-28 DIAGNOSIS — Z882 Allergy status to sulfonamides status: Secondary | ICD-10-CM | POA: Insufficient documentation

## 2022-05-28 DIAGNOSIS — R531 Weakness: Secondary | ICD-10-CM | POA: Diagnosis not present

## 2022-05-28 DIAGNOSIS — Z79899 Other long term (current) drug therapy: Secondary | ICD-10-CM | POA: Insufficient documentation

## 2022-05-28 DIAGNOSIS — Z79624 Long term (current) use of inhibitors of nucleotide synthesis: Secondary | ICD-10-CM | POA: Diagnosis not present

## 2022-05-28 DIAGNOSIS — J449 Chronic obstructive pulmonary disease, unspecified: Secondary | ICD-10-CM | POA: Insufficient documentation

## 2022-05-28 DIAGNOSIS — F1290 Cannabis use, unspecified, uncomplicated: Secondary | ICD-10-CM | POA: Diagnosis not present

## 2022-05-28 DIAGNOSIS — Z885 Allergy status to narcotic agent status: Secondary | ICD-10-CM | POA: Diagnosis not present

## 2022-05-28 DIAGNOSIS — Z8249 Family history of ischemic heart disease and other diseases of the circulatory system: Secondary | ICD-10-CM | POA: Insufficient documentation

## 2022-05-28 DIAGNOSIS — Z881 Allergy status to other antibiotic agents status: Secondary | ICD-10-CM | POA: Insufficient documentation

## 2022-05-28 DIAGNOSIS — D75839 Thrombocytosis, unspecified: Secondary | ICD-10-CM | POA: Insufficient documentation

## 2022-05-28 DIAGNOSIS — I10 Essential (primary) hypertension: Secondary | ICD-10-CM | POA: Diagnosis not present

## 2022-05-28 DIAGNOSIS — F319 Bipolar disorder, unspecified: Secondary | ICD-10-CM | POA: Diagnosis not present

## 2022-05-28 DIAGNOSIS — Z888 Allergy status to other drugs, medicaments and biological substances status: Secondary | ICD-10-CM | POA: Insufficient documentation

## 2022-05-28 DIAGNOSIS — Z87442 Personal history of urinary calculi: Secondary | ICD-10-CM | POA: Diagnosis not present

## 2022-05-28 DIAGNOSIS — K219 Gastro-esophageal reflux disease without esophagitis: Secondary | ICD-10-CM | POA: Insufficient documentation

## 2022-05-28 DIAGNOSIS — Z87891 Personal history of nicotine dependence: Secondary | ICD-10-CM | POA: Diagnosis not present

## 2022-05-28 LAB — CBC WITH DIFFERENTIAL/PLATELET
Abs Immature Granulocytes: 0.07 10*3/uL (ref 0.00–0.07)
Basophils Absolute: 0.1 10*3/uL (ref 0.0–0.1)
Basophils Relative: 1 %
Eosinophils Absolute: 0.4 10*3/uL (ref 0.0–0.5)
Eosinophils Relative: 5 %
HCT: 38 % (ref 36.0–46.0)
Hemoglobin: 12.8 g/dL (ref 12.0–15.0)
Immature Granulocytes: 1 %
Lymphocytes Relative: 25 %
Lymphs Abs: 2 10*3/uL (ref 0.7–4.0)
MCH: 32 pg (ref 26.0–34.0)
MCHC: 33.7 g/dL (ref 30.0–36.0)
MCV: 95 fL (ref 80.0–100.0)
Monocytes Absolute: 0.6 10*3/uL (ref 0.1–1.0)
Monocytes Relative: 7 %
Neutro Abs: 4.9 10*3/uL (ref 1.7–7.7)
Neutrophils Relative %: 61 %
Platelets: 431 10*3/uL — ABNORMAL HIGH (ref 150–400)
RBC: 4 MIL/uL (ref 3.87–5.11)
RDW: 12.8 % (ref 11.5–15.5)
WBC: 7.9 10*3/uL (ref 4.0–10.5)
nRBC: 0 % (ref 0.0–0.2)

## 2022-05-28 LAB — IRON AND TIBC
Iron: 92 ug/dL (ref 28–170)
Saturation Ratios: 19 % (ref 10.4–31.8)
TIBC: 484 ug/dL — ABNORMAL HIGH (ref 250–450)
UIBC: 392 ug/dL

## 2022-05-28 LAB — FERRITIN: Ferritin: 113 ng/mL (ref 11–307)

## 2022-05-28 MED FILL — Iron Sucrose Inj 20 MG/ML (Fe Equiv): INTRAVENOUS | Qty: 10 | Status: AC

## 2022-05-28 NOTE — Progress Notes (Signed)
Patient denies any concerns today.  

## 2022-05-29 ENCOUNTER — Encounter: Payer: Self-pay | Admitting: Oncology

## 2022-06-01 ENCOUNTER — Telehealth: Payer: Self-pay | Admitting: Primary Care

## 2022-06-01 ENCOUNTER — Ambulatory Visit: Payer: PPO | Admitting: Urology

## 2022-06-01 ENCOUNTER — Encounter: Payer: Self-pay | Admitting: Urology

## 2022-06-01 VITALS — BP 110/70 | HR 90 | Ht 69.0 in | Wt 229.0 lb

## 2022-06-01 DIAGNOSIS — N3020 Other chronic cystitis without hematuria: Secondary | ICD-10-CM | POA: Diagnosis not present

## 2022-06-01 DIAGNOSIS — R3915 Urgency of urination: Secondary | ICD-10-CM

## 2022-06-01 MED ORDER — TRIMETHOPRIM 100 MG PO TABS: 100.0000 mg | ORAL_TABLET | Freq: Every day | ORAL | 11 refills | Status: DC

## 2022-06-01 NOTE — Telephone Encounter (Signed)
LVM for pt to rtn my call to schedule AWV with [facility] call back # [phone] 

## 2022-06-01 NOTE — Patient Instructions (Signed)

## 2022-06-01 NOTE — Progress Notes (Signed)
06/01/2022 1:05 PM   Emily Moon [birth year] [id number]  Referring provider: [name], NP [place],  [place] [address]  No chief complaint on file.   HPI: I saw the patient in 2019 and she was cleared for microscopic hematuria.  In the last year she has been admitted twice with sepsis with fever of 102 burning and straining to urinate.  She has a distant history of a kidney stone.  Has not had bladder infections otherwise.  She also describes getting infections perhaps after her stent removed  At baseline she voids every 1 hour and cannot hold it for 2 hours.  Gets up once a night.  She is continent  Discharge summary noted pyelonephritis diabetes fibromyalgia and UTI symptoms with a white blood count of 14.1.  She had E. coli in the urine.  CT scan demonstrated decreased perfusion right kidney that was subtle in keeping of pyelonephritis and no stones or hydronephrosis     PMH: Past Medical History:  Diagnosis Date   Arthritis    osteo   Asthma    Bipolar disorder ([place]) 05/21/2014   Cataracts, bilateral    COPD (chronic obstructive pulmonary disease) (HCC)    DDD (degenerative disc disease), cervical    also back   Depression    Diabetes mellitus without complication (HCC)    Dizziness    Positional   Edema    feet/legs   Fibromyalgia syndrome    Fungal infection    Finger nails   GERD (gastroesophageal reflux disease)    Gout    Heart palpitations    Hip dysplasia, congenital 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   History of kidney stones    Hypercholesterolemia    IDA (iron deficiency anemia)    Multiple sclerosis (HCC)    weakness   Osteoporosis    osteoarthritis   Pneumonia    PONV (postoperative nausea and vomiting)    no problem after cataract surgery   Prediabetes 07/22/2018   Psoriasis    Shortness of breath dyspnea    wheezing   Sleep apnea 2012   sleep study / slight, no interventions   Urinary frequency    Weight gain 06/21/2014     Surgical History: Past Surgical History:  Procedure Laterality Date   CATARACT EXTRACTION W/PHACO Left 05/21/2015   Procedure: CATARACT EXTRACTION PHACO AND INTRAOCULAR LENS PLACEMENT ([facility]);  Surgeon: Birder Robson, MD;  Location: [facility];  Service: Ophthalmology;  Laterality: Left;  [place] 00:35 AP% 22.9 CDE 8.11 fluid pack lot #[id number]   CATARACT EXTRACTION W/PHACO Right 06/04/2015   Procedure: CATARACT EXTRACTION PHACO AND INTRAOCULAR LENS PLACEMENT ([facility]);  Surgeon: Birder Robson, MD;  Location: [facility];  Service: Ophthalmology;  Laterality: Right;  US:00:48 AP%: 10.5 CDE:5.08 Fluid lot #[id number]   CYSTOSCOPY/URETEROSCOPY/HOLMIUM LASER/STENT PLACEMENT Bilateral 09/22/2016   Procedure: CYSTOSCOPY/URETEROSCOPY/HOLMIUM LASER/STENT PLACEMENT;  Surgeon: Hollice Espy, MD;  Location: [facility];  Service: Urology;  Laterality: Bilateral;   EYE SURGERY  2015   tissue biopsy   FOOT SURGERY  2015   JOINT REPLACEMENT Left 2013   hip replacement   LITHOTRIPSY     PTOSIS REPAIR Bilateral 02/18/2016   Procedure: BILATERAL PTOSIS REPAIR UPPER EYELIDS;  Surgeon: Karle Starch, MD;  Location: [facility];  Service: Ophthalmology;  Laterality: Bilateral;  LEAVE PT EARLY AM   thumb surgery Right    TONSILLECTOMY  1973   TOTAL HIP ARTHROPLASTY Right 07/02/2021   Procedure: TOTAL HIP ARTHROPLASTY ANTERIOR APPROACH;  Surgeon: Wynelle Link,  Pilar Plate, MD;  Location: [facility];  Service: Orthopedics;  Laterality: Right;    Home Medications:  Allergies as of 06/01/2022       Reactions   Albuterol Shortness Of Breath, Other (See Comments)   Makes pt feel jittery/ tacycardic   Crestor [rosuvastatin] Other (See Comments)   Joint pain, muscle pain, and hair loss   Halcion [triazolam] Other (See Comments)   Dizziness,headaches,bladder problems   Levaquin [levofloxacin In D5w] Diarrhea, Itching   Shoulder pain   Metformin And Related Rash   Naproxen Sodium    Patient tolerates in small doses. Her  reaction is swelling of the lower extremities.    Cefaclor Other (See Comments)   Doesn't remember---unsure if actually allergic    Sulfa Antibiotics Itching   Unsure of reaction possibly itching   Tramadol Itching, Nausea And Vomiting   Zinc Other (See Comments)   constipation  constipation    Aripiprazole Other (See Comments)   Muscle tension/cramping   Diclofenac Sodium Rash   "made very sick"        Medication List        Accurate as of June 01, 2022  1:05 PM. If you have any questions, ask your nurse or doctor.          aspirin EC 81 MG tablet Take 81 mg by mouth at bedtime. Swallow whole.   Azelastine HCl 137 MCG/SPRAY Soln PLACE 2 SPRAYS INTO BOTH NOSTRILS 2 (TWO) TIMES DAILY   BIG 100 (BIOTIN) PO Take 300 mg by mouth at bedtime.   cetirizine 10 MG tablet Commonly known as: ZYRTEC Take 10 mg by mouth 2 (two) times daily.   clindamycin 1 % external solution Commonly known as: CLEOCIN T Apply to scalp once or twice a day prn bumps   cyanocobalamin 1000 MCG/ML injection Commonly known as: (VITAMIN B-12) INJECT 1 ML (1,000 MCG TOTAL) INTO THE MUSCLE EVERY 30 (THIRTY) DAYS. FOR B12 VITAMIN   desipramine 25 MG tablet Commonly known as: NORPRAMIN Take 1 tablet (25 mg total) by mouth daily.   diphenhydrAMINE 2 % cream Commonly known as: BENADRYL Apply topically 3 (three) times daily as needed for itching.   DULoxetine 60 MG capsule Commonly known as: CYMBALTA Take 1 capsule (60 mg total) by mouth daily.   fenofibrate 145 MG tablet Commonly known as: TRICOR Take 1 tablet (145 mg total) by mouth daily. For cholesterol.   ferrous sulfate 325 (65 FE) MG tablet Take 1 tablet (325 mg total) by mouth 2 (two) times daily with a meal.   FreeStyle Libre 3 Sensor Misc Place 1 sensor on the skin every 14 days. Use to check glucose continuously   hydrOXYzine 50 MG capsule Commonly known as: VISTARIL Take 2 capsules (100 mg total) by mouth at bedtime. TAKE 1  CAPSLE BY MOUTH AT NOON, 1 AT 6 PM, AND 1 AS NEEDED   lamoTRIgine 200 MG tablet Commonly known as: LAMICTAL 2 qhs   Lysine 500 MG Tabs Take 2,000 mg by mouth daily.   methocarbamol 500 MG tablet Commonly known as: ROBAXIN TAKE 1-2 TABLETS (500-1,000 MG TOTAL) BY MOUTH AT BEDTIME AS NEEDED FOR MUSCLE SPASMS.   metoprolol tartrate 50 MG tablet Commonly known as: LOPRESSOR Take 1 tablet (50 mg total) by mouth 2 (two) times daily.   neomycin-bacitracin-polymyxin [phone] ointment Apply 1 application topically 4 (four) times daily as needed (for cut/scrapes.).   Opzelura 1.5 % Crea Generic drug: Ruxolitinib Phosphate Apply topically.   pantoprazole 40 MG tablet  Commonly known as: PROTONIX TAKE 1 TABLET (40 MG TOTAL) BY MOUTH 2 (TWO) TIMES DAILY BEFORE A MEAL. FOR HEARTBURN   polyvinyl alcohol 1.4 % ophthalmic solution Commonly known as: LIQUIFILM TEARS Place 2 drops into both eyes at bedtime.   Repatha SureClick 324 MG/ML Soaj Generic drug: Evolocumab Inject 1 Dose into the skin every 14 (fourteen) days.   Semaglutide (1 MG/DOSE) 4 MG/3ML Sopn Inject 1 mg as directed once a week. for diabetes.   Stiolto Respimat 2.5-2.5 MCG/ACT Aers Generic drug: Tiotropium Bromide-Olodaterol Inhale 2 puffs into the lungs at bedtime.   temazepam 30 MG capsule Commonly known as: RESTORIL TAKE ONE TO TWO CAPSULES BY MOUTH NIGHTLY AT BEDTIME   traZODone 50 MG tablet Commonly known as: DESYREL Take 2 tablets (100 mg total) by mouth at bedtime.   valACYclovir 1000 MG tablet Commonly known as: VALTREX Take 2 tablets by mouth twice daily for 1 day as needed for herpes outbreak.   vitamin C 500 MG tablet Commonly known as: ASCORBIC ACID Take 500 mg by mouth 2 (two) times daily.   Vitamin D3 25 MCG tablet Commonly known as: Vitamin D Take 3,000 Units by mouth daily.        Allergies:  Allergies  Allergen Reactions   Albuterol Shortness Of Breath and Other (See Comments)     Makes pt feel jittery/ tacycardic   Crestor [Rosuvastatin] Other (See Comments)    Joint pain, muscle pain, and hair loss   Halcion [Triazolam] Other (See Comments)    Dizziness,headaches,bladder problems   Levaquin [Levofloxacin In D5w] Diarrhea and Itching    Shoulder pain   Metformin And Related Rash   Naproxen Sodium     Patient tolerates in small doses. Her reaction is swelling of the lower extremities.    Cefaclor Other (See Comments)    Doesn't remember---unsure if actually allergic    Sulfa Antibiotics Itching    Unsure of reaction possibly itching   Tramadol Itching and Nausea And Vomiting   Zinc Other (See Comments)    constipation  constipation    Aripiprazole Other (See Comments)    Muscle tension/cramping   Diclofenac Sodium Rash    "made very sick"    Family History: Family History  Problem Relation Age of Onset   Cancer Father        Abdomen with mastasis   Cancer Mother    Heart disease Mother    Kidney disease Neg Hx    Bladder Cancer Neg Hx    Prostate cancer Neg Hx    Kidney cancer Neg Hx     Social History:  reports that she quit smoking about 4 years ago. Her smoking use included cigarettes. She has a 25.00 pack-year smoking history. She has never used smokeless tobacco. She reports current drug use. Drugs: Methylphenidate and Marijuana. She reports that she does not drink alcohol.  ROS:                                        Physical Exam: LMP 08/23/2014   Constitutional:  Alert and oriented, No acute distress.  Laboratory Data: Lab Results  Component Value Date   WBC 7.9 05/28/2022   HGB 12.8 05/28/2022   HCT 38.0 05/28/2022   MCV 95.0 05/28/2022   PLT 431 (H) 05/28/2022    Lab Results  Component Value Date   CREATININE 1.09 05/14/2022  No results found for: "PSA"  No results found for: "TESTOSTERONE"  Lab Results  Component Value Date   HGBA1C 6.5 (A) 04/01/2022    Urinalysis    Component Value  Date/Time   COLORURINE YELLOW (A) 03/23/2022 1148   APPEARANCEUR CLOUDY (A) 03/23/2022 1148   APPEARANCEUR Clear 09/12/2018 0842   LABSPEC 1.016 03/23/2022 1148   PHURINE 5.0 03/23/2022 1148   GLUCOSEU NEGATIVE 03/23/2022 1148   HGBUR NEGATIVE 03/23/2022 1148   HGBUR moderate 11/13/2010 0945   BILIRUBINUR positive 05/14/2022 1140   BILIRUBINUR Negative 09/12/2018 0842   KETONESUR NEGATIVE 03/23/2022 1148   PROTEINUR Positive (A) 05/14/2022 1140   PROTEINUR 100 (A) 03/23/2022 1148   UROBILINOGEN 0.2 05/14/2022 1140   UROBILINOGEN 0.2 11/13/2010 0945   NITRITE neg 05/14/2022 1140   NITRITE NEGATIVE 03/23/2022 1148   LEUKOCYTESUR Small (1+) (A) 05/14/2022 1140   LEUKOCYTESUR LARGE (A) 03/23/2022 1148    Pertinent Imaging: Urine positive.  Had another sample sent for culture.  Chart reviewed  Assessment & Plan: Because of 2 episodes of sepsis we talked about pathophysiology and prophylaxis.  Reassess in about 2 months with cystoscopy on daily trimethoprim 100 mg 30x11.  Call if culture positive  There are no diagnoses linked to this encounter.  No follow-ups on file.  Reece Packer, MD  [facility] [address], [place], [place] [address] [phone]

## 2022-06-02 LAB — URINALYSIS, COMPLETE
Bilirubin, UA: NEGATIVE
Glucose, UA: NEGATIVE
Ketones, UA: NEGATIVE
Nitrite, UA: NEGATIVE
Specific Gravity, UA: 1.03 (ref 1.005–1.030)
Urobilinogen, Ur: 0.2 mg/dL (ref 0.2–1.0)
pH, UA: 5.5 (ref 5.0–7.5)

## 2022-06-02 LAB — MICROSCOPIC EXAMINATION: WBC, UA: >30 /hpf — AB (ref 0–5)

## 2022-06-04 ENCOUNTER — Telehealth (HOSPITAL_COMMUNITY): Payer: Self-pay | Admitting: *Deleted

## 2022-06-04 ENCOUNTER — Other Ambulatory Visit (HOSPITAL_COMMUNITY): Payer: Self-pay | Admitting: Psychiatry

## 2022-06-04 DIAGNOSIS — G4733 Obstructive sleep apnea (adult) (pediatric): Secondary | ICD-10-CM | POA: Diagnosis not present

## 2022-06-04 LAB — CULTURE, URINE COMPREHENSIVE

## 2022-06-04 NOTE — Telephone Encounter (Signed)
[employer] excuse letter to be picked up mail and sent out to pt today.

## 2022-06-05 ENCOUNTER — Other Ambulatory Visit: Payer: Self-pay | Admitting: Primary Care

## 2022-06-05 DIAGNOSIS — M545 Low back pain, unspecified: Secondary | ICD-10-CM

## 2022-06-05 DIAGNOSIS — M2550 Pain in unspecified joint: Secondary | ICD-10-CM

## 2022-06-26 ENCOUNTER — Other Ambulatory Visit (HOSPITAL_COMMUNITY): Payer: Self-pay | Admitting: *Deleted

## 2022-06-26 ENCOUNTER — Other Ambulatory Visit (HOSPITAL_COMMUNITY): Payer: Self-pay | Admitting: Psychiatry

## 2022-06-26 MED ORDER — DULOXETINE HCL 40 MG PO CPEP: 60.0000 mg | ORAL_CAPSULE | Freq: Every day | ORAL | 1 refills | Status: DC

## 2022-06-30 ENCOUNTER — Other Ambulatory Visit (HOSPITAL_COMMUNITY): Payer: Self-pay | Admitting: *Deleted

## 2022-06-30 ENCOUNTER — Telehealth (HOSPITAL_COMMUNITY): Payer: Self-pay | Admitting: *Deleted

## 2022-06-30 ENCOUNTER — Other Ambulatory Visit (HOSPITAL_COMMUNITY): Payer: Self-pay | Admitting: Psychiatry

## 2022-06-30 DIAGNOSIS — F310 Bipolar disorder, current episode hypomanic: Secondary | ICD-10-CM

## 2022-06-30 MED ORDER — DESIPRAMINE HCL 25 MG PO TABS: ORAL_TABLET | ORAL | 6 refills | Status: DC

## 2022-06-30 MED ORDER — DULOXETINE HCL 40 MG PO CPEP: 40.0000 mg | ORAL_CAPSULE | Freq: Every day | ORAL | 0 refills | Status: DC

## 2022-06-30 MED ORDER — LAMOTRIGINE 150 MG PO TABS
ORAL_TABLET | ORAL | 1 refills | Status: DC
Start: 2022-06-30 — End: 2022-08-04

## 2022-06-30 NOTE — Telephone Encounter (Signed)
Writer returned pt message regarding medication management and being "in a dark space". Pt is worries about her weight and has cut back to 40 mg of Cymbalta QD, but is not feeling any better. Pt sounded irritable and distracted on VM that was left on nurse line. Pt did not answer the phone on return call so VM was left that Dr. Casimiro Needle will be in the office later today and writer will provider to either call pt or give this nurse verbal orders and she will call pt back today.

## 2022-07-02 ENCOUNTER — Telehealth: Payer: Self-pay

## 2022-07-02 ENCOUNTER — Telehealth (HOSPITAL_COMMUNITY): Payer: Self-pay | Admitting: *Deleted

## 2022-07-02 NOTE — Telephone Encounter (Signed)
Writer spoke to Emily Moon to advise that Dr. Casimiro Needle sent in new Rx for Cymbalta 40 mg and he increased the Desipramine 25 mg qam to 50 mg qam. Emily Moon verbalizes understanding. Emily Moon encouraged to call with any questions or concerns prior to next appointment on 08/04/22. Emily Moon agrees.

## 2022-07-02 NOTE — Progress Notes (Addendum)
Chronic Care Management Pharmacy Assistant   Name: Emily Moon  MRN: [id number] DOB: 02/22/1961  Reason for Encounter: CCM (Appointment Reminder)  Medications: Outpatient Encounter Medications as of 07/02/2022  Medication Sig   aspirin EC 81 MG tablet Take 81 mg by mouth at bedtime. Swallow whole.   Azelastine HCl 137 MCG/SPRAY SOLN PLACE 2 SPRAYS INTO BOTH NOSTRILS 2 (TWO) TIMES DAILY   B Complex-Biotin-FA (BIG 100, BIOTIN, PO) Take 300 mg by mouth at bedtime.   cetirizine (ZYRTEC) 10 MG tablet Take 10 mg by mouth 2 (two) times daily.   clindamycin (CLEOCIN T) 1 % external solution Apply to scalp once or twice a day prn bumps   Continuous Blood Gluc Sensor (FREESTYLE LIBRE 3 SENSOR) MISC Place 1 sensor on the skin every 14 days. Use to check glucose continuously   cyanocobalamin (,VITAMIN B-12,) 1000 MCG/ML injection INJECT 1 ML (1,000 MCG TOTAL) INTO THE MUSCLE EVERY 30 (THIRTY) DAYS. FOR B12 VITAMIN   desipramine (NORPRAMIN) 25 MG tablet 2  qam   diphenhydrAMINE (BENADRYL) 2 % cream Apply topically 3 (three) times daily as needed for itching.   DULoxetine HCl 40 MG CPEP Take 40 mg by mouth daily.   Evolocumab (REPATHA SURECLICK) 709 MG/ML SOAJ Inject 1 Dose into the skin every 14 (fourteen) days.   fenofibrate (TRICOR) 145 MG tablet Take 1 tablet (145 mg total) by mouth daily. For cholesterol.   ferrous sulfate 325 (65 FE) MG tablet Take 1 tablet (325 mg total) by mouth 2 (two) times daily with a meal.   hydrOXYzine (VISTARIL) 50 MG capsule Take 2 capsules (100 mg total) by mouth at bedtime. TAKE 1 CAPSLE BY MOUTH AT NOON, 1 AT 6 PM, AND 1 AS NEEDED   lamoTRIgine (LAMICTAL) 150 MG tablet Take 2 tablets (300 mg total) by mouth daily at bedtime.   Lysine 500 MG TABS Take 2,000 mg by mouth daily.   methocarbamol (ROBAXIN) 500 MG tablet TAKE 1-2 TABLETS (500-1,000 MG TOTAL) BY MOUTH AT BEDTIME AS NEEDED FOR MUSCLE SPASMS.   metoprolol tartrate (LOPRESSOR) 50 MG tablet Take 1 tablet  (50 mg total) by mouth 2 (two) times daily.   neomycin-bacitracin-polymyxin (NEOSPORIN) [phone] ointment Apply 1 application topically 4 (four) times daily as needed (for cut/scrapes.).   pantoprazole (PROTONIX) 40 MG tablet TAKE 1 TABLET (40 MG TOTAL) BY MOUTH 2 (TWO) TIMES DAILY BEFORE A MEAL. FOR HEARTBURN   polyvinyl alcohol (LIQUIFILM TEARS) 1.4 % ophthalmic solution Place 2 drops into both eyes at bedtime.   Ruxolitinib Phosphate (OPZELURA) 1.5 % CREA Apply topically.   Semaglutide, 1 MG/DOSE, 4 MG/3ML SOPN Inject 1 mg as directed once a week. for diabetes.   STIOLTO RESPIMAT 2.5-2.5 MCG/ACT AERS Inhale 2 puffs into the lungs at bedtime.   temazepam (RESTORIL) 30 MG capsule TAKE ONE TO TWO CAPSULES BY MOUTH NIGHTLY AT BEDTIME   traZODone (DESYREL) 50 MG tablet Take 2 tablets (100 mg total) by mouth at bedtime.   trimethoprim (TRIMPEX) 100 MG tablet Take 1 tablet (100 mg total) by mouth daily.   valACYclovir (VALTREX) 1000 MG tablet Take 2 tablets by mouth twice daily for 1 day as needed for herpes outbreak.   vitamin C (ASCORBIC ACID) 500 MG tablet Take 500 mg by mouth 2 (two) times daily.   Vitamin D3 (VITAMIN D) 25 MCG tablet Take 3,000 Units by mouth daily.   No facility-administered encounter medications on file as of 07/02/2022.   [name] was contacted to  remind of upcoming telephone visit with Charlene Brooke on 07/07/22 at 3:00. Patient was reminded to have any blood glucose and blood pressure readings available for review at appointment.   Patient confirmed appointment.  Are you having any problems with your medications? No - Patient did ask for her Trulicity to stop being delivered as she is now taking Ozempic. I called [facility] and requested the prescription to be cancelled. No further action needed.   Do you have any concerns you like to discuss with the pharmacist? Yes  Patient stated she is having a difficult time with [facility] as a whole. I explained [facility] and all the services they offer. I explained how [name], NP, Charlene Brooke, RPH, [facility] and myself all fit together within [facility]. Patient was very interested in her medications being delivered by [facility].  Patient also stated her sleep cycle is all messed up; patient did not get to sleep until 8:00am this morning and I called at 10:00am. Patient was going back to sleep after our call.  Patient is having a difficult time with her knee that she fractured while skydiving on 04/08/22. She states she needs PT for her knee. She did receive a referral from [facility], but she did not want to drive to [place] as patient lives in [place]. Patient is asking if Allie Bossier, NP could refer her to PT. She would like it if it would consist of OT and PT together.   CCM referral has been placed prior to visit?  No   Star Rating Drugs: Medication:  Last Fill: Day Supply Ozempic 1 mg  PAP  Charlene Brooke, CPP notified  Marijean Niemann, [place] Pharmacy Assistant [phone]

## 2022-07-04 ENCOUNTER — Other Ambulatory Visit: Payer: Self-pay | Admitting: Primary Care

## 2022-07-04 DIAGNOSIS — D509 Iron deficiency anemia, unspecified: Secondary | ICD-10-CM

## 2022-07-07 ENCOUNTER — Other Ambulatory Visit: Payer: Self-pay | Admitting: Medical

## 2022-07-07 ENCOUNTER — Ambulatory Visit: Payer: PPO | Admitting: Pharmacist

## 2022-07-07 DIAGNOSIS — D509 Iron deficiency anemia, unspecified: Secondary | ICD-10-CM

## 2022-07-07 DIAGNOSIS — J4489 Other specified chronic obstructive pulmonary disease: Secondary | ICD-10-CM

## 2022-07-07 DIAGNOSIS — J449 Chronic obstructive pulmonary disease, unspecified: Secondary | ICD-10-CM

## 2022-07-07 DIAGNOSIS — E1165 Type 2 diabetes mellitus with hyperglycemia: Secondary | ICD-10-CM

## 2022-07-07 DIAGNOSIS — E785 Hyperlipidemia, unspecified: Secondary | ICD-10-CM

## 2022-07-07 DIAGNOSIS — F3170 Bipolar disorder, currently in remission, most recent episode unspecified: Secondary | ICD-10-CM

## 2022-07-07 NOTE — Progress Notes (Signed)
Chronic Care Management Pharmacy Note  07/16/2022 Name:  Emily Moon MRN:  [id number] DOB:  1961-03-05  Summary: CCM F/U visit -DM: excellent control currently, per CGM data time-in-range (80-180) is 97%; Ozempic PAP was approved June 2023 -COPD/asthma: Stiolto PAP was approved June 2023 -HLD: pt is tolerating Repatha well, uses [employer] grant to cover copays -Pain (knee after fracture 03/2022, chronic back pain): pt is taking ibuprofen 400 mg BID consistently; reviewed risks of chronic NSAID use - currently GFR is 53, BP is normal, she has no history of GI bleeds  Recommendations/Changes made from today's visit: -Advised to limit ibuprofen use as much as possible; pt is unable to commit to stopping it altogether but will try to limit use  Plan: -[name] will call patient 3 months for general update -Pharmacist follow up televisit scheduled for 6 months -PCP CPE 08/11/22    Subjective: Emily Moon is an 61 y.o. year old female who is a primary patient of [name], NP.  The CCM team was consulted for assistance with disease management and care coordination needs.    Engaged with patient by telephone for follow up visit in response to provider referral for pharmacy case management and/or care coordination services.   Consent to Services:  The patient was given information about Chronic Care Management services, agreed to services, and gave verbal consent prior to initiation of services.  Please see initial visit note for detailed documentation.   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 She only leaves home for grocery trips and doctor appts.  Recent office visits: 05/14/22 NP Allie Bossier OV: dysruria. Rx'd amoxicillin.  04/01/22 DNP Allie Bossier OV: f/u DM. A1c 6.5%; no med changes 12/31/21 NP [name] OV: hospital f/u; workup IDA, referred for colonoscopy. D/C metformin (rash); continue Trulicity. Referred to hematology for anemia evaluation.  11/04/21 Dr Silvio Pate OV: f/u DM. Pt mixed up Trulicity and Repatha. Concerned about high BG later in day - add metformin 500 mg daily. Can increase to 2 tablets as tolerated.  09/19/21 Dr Silvio Pate OV: UTI - rx'd Macrobid  09/02/2021 - [name], NP - Video Visit - Patient presented for facial swelling. Labs: A1c. Increase Trulicity to 3 mg/week.  Recent consult visits: 07/02/22 Behavioral Health TE - reduced duloxetine back to 40 mg per pt preference, increase desipramine to 25 mg AM and 50 mg PM.  06/01/22 Dr Matilde Sprang (Urology): new patient - rx daily trimethoprim for recurrent UTI and sepsis.  05/28/22 Dr Grayland Ormond (Heme/onc): f/u anemia. HGB and iron WNL, no intervention needed.  05/12/22 Dr Casimiro Needle (Psych): f/u Bipolar 1 - increase duloxetine to 60 mg. Increase lamotrigine to 400 mg  12/04/21 NP Tammy Parrett (pulmonary VV): f/u COPD. Start Stiolto 2 puffs daily. Use Xoponex PRN. 12/03/21 Dr Casimiro Needle VV (psychiatry): f/u depression. Short visit, no med change and RTC 2 months for in-person visit. 11/18/21 Dr Laurence Ferrari (dermatology): f/u eczema and folliculitis. Start clindamycin solution for scalp. 10/27/21 Dr Garen Lah (cardiology): f/u tachycardia, improved with lopressor. No med changes. 10/21/21 Dr Laurence Ferrari (dermatology): f/u eczema flare. Restart clobetasol foam. 10/14/21 Dr Melrose Nakayama [facility]): initial consult for MS. Start Tecfidera 120  mg BID x 2 weeks then increase to 240 mg BID. F/U 2 months. 08/22/21 PA Kathlen Mody (Cardiology): f/u tachycardia. D/c toprol and Start metoprolol tartrate 50 mg BID.  Ordered 2-wk heart monitor 08/08/2021 - Dr Garen Lah, Cardiology - Patient presented for follow up inappropriate sinus tachycardia. Labs: Increase metoprolol succinate to 75 mg daily. Returned to 50 mg dose shortly after due to worsening sx. 07/30/2021 - Behavioral Health - Video Visit for Mixed Bipolar, depression, insomnia. No med changes. 06/27/2021 - NP Parrett, Pulmonary - Patient presented for COPD with Asthma. Labs: DG Chest 2 views No medication changes. 06/26/2021 - Cardiology - Patient presented for follow up inappropriate sinus tachycardia. Labs: Lipid and EKG. No medication changes. 06/19/2021 - Dr Jefm Bryant, Rheumatology - referred to rule out psoriatic arthritis. No definite dx of psoriasis.  Hospital visits: Medication Reconciliation was completed by comparing discharge summary, patient's EMR and Pharmacy list, and upon discussion with patient.  Admitted to the hospital on 12/24/21 due to Pyelonephritis/sepsis. Discharge date was 12/27/21. Discharged from [facility] UTI. Follow up PCP for iron deficiency workup  New?Medications Started at [facility] Discharge:?? -started Amoxicillin due to Urosepsis -Oral iron  Medications that remain the same after Hospital Discharge:??  -All other medications will remain the same.    Objective:  Lab Results  Component Value Date   CREATININE 1.09 05/14/2022   BUN 25 (H) 05/14/2022   GFR 55.38 (L) 05/14/2022   GFRNONAA >60 03/23/2022   GFRAA >60 02/19/2020   NA 138 05/14/2022   K 4.5 05/14/2022   CALCIUM 9.2 05/14/2022   CO2 27 05/14/2022   GLUCOSE 105 (H) 05/14/2022    Lab Results  Component Value Date/Time   HGBA1C 6.5 (A) 04/01/2022 03:04 PM   HGBA1C 6.7 (A) 11/04/2021 10:20 AM   HGBA1C 6.6 (H) 07/02/2021 01:08 PM   HGBA1C 6.9 (H) 03/10/2021 04:28 PM   GFR 55.38 (L) 05/14/2022 11:43 AM   GFR 60.84 12/31/2021 01:38 PM   MICROALBUR 1.0 03/10/2021 04:28 PM    Last diabetic Eye exam:  Lab Results  Component  Value Date/Time   HMDIABEYEEXA No Retinopathy 01/28/2022 12:00 AM    Last diabetic Foot exam: No results found for: "HMDIABFOOTEX"   Lab Results  Component Value Date   CHOL 159 03/10/2021   HDL 39.90 03/10/2021   LDLCALC 81 03/10/2021   LDLDIRECT 53.0 08/01/2020   TRIG 189.0 (H) 03/10/2021   CHOLHDL 4 03/10/2021       Latest Ref Rng & Units 12/24/2021    6:01 PM 06/23/2021    2:53 PM 02/19/2020    3:53 PM  Hepatic Function  Total Protein 6.5 - 8.1 g/dL 7.3  7.7  7.6   Albumin 3.5 - 5.0 g/dL 3.8  4.8  4.5   AST 15 - 41 U/L _0 ALT 0 - 44 U/L 20  44  19   Alk Phosphatase 38 - 126 U/L 55  44  75   Total Bilirubin 0.3 - 1.2 mg/dL 0.7  0.5  0.8     Lab Results  Component Value Date/Time   TSH 0.902 02/19/2020 03:53 PM   TSH 1.31 05/10/2019 02:34 PM   TSH 4.32 11/24/2018 08:26 AM   FREET4 1.08 02/19/2020 03:53 PM   FREET4 0.82 07/25/2013 12:16 PM       Latest Ref Rng & Units 05/28/2022    1:30 PM 05/14/2022   11:43 AM 03/24/2022    3:11 AM  CBC  WBC 4.0 - 10.5 K/uL 7.9  8.8  9.1   Hemoglobin 12.0 - 15.0 g/dL 12.8  12.8  11.2   Hematocrit 36.0 - 46.0 % 38.0  38.4  34.3   Platelets 150 - 400 K/uL 431  439.0  282    Iron/TIBC/Ferritin/ %Sat    Component Value Date/Time   IRON 92 05/28/2022 1330   TIBC 484 (H) 05/28/2022 1330   FERRITIN 113 05/28/2022 1330   IRONPCTSAT 19 05/28/2022 1330    Lab Results  Component Value Date/Time   VD25OH 75.66 01/22/2020 02:32 PM   VD25OH 61.68 08/15/2019 11:13 AM    Clinical ASCVD: No  The 10-year ASCVD risk score (Arnett DK, et al., 2019) is: 6.5%   Values used to calculate the score:     Age: 54 years     Sex: Female     Is Non-Hispanic African American: No     Diabetic: Yes     Tobacco smoker: No     Systolic Blood Pressure: 122 mmHg     Is BP treated: Yes     HDL Cholesterol: 39.9 mg/dL     Total Cholesterol: 159 mg/dL       06/03/2021    2:18 PM 04/23/2021    2:10 PM 04/05/2020    3:09 PM  Depression screen  PHQ 2/9  Decreased Interest _0 Down, Depressed, Hopeless _1 PHQ - 2 Score _2 Altered sleeping 3 3 0  Tired, decreased energy 3 3 0  Change in appetite 0 0 0  Feeling bad or failure about yourself  0 3 0  Trouble concentrating 1 0 0  Moving slowly or fidgety/restless 1 0 0  Suicidal thoughts 1 0 0  PHQ-9 Score _3 Difficult doing work/chores Somewhat difficult Very difficult Somewhat difficult        No data to display           Social History   Tobacco Use  Smoking Status Former   Packs/day: 1.00   Years: 25.00   Total pack years: 25.00   Types: Cigarettes   Quit date: 2019   Years since quitting: 4.6  Smokeless Tobacco Never  Tobacco Comments   occasional use   BP Readings from Last 3 Encounters:  06/01/22 110/70  05/28/22 107/63  05/14/22 100/80   Pulse Readings from Last 3 Encounters:  06/01/22 90  05/28/22 87  05/14/22 100   Wt Readings from Last 3 Encounters:  06/01/22 229 lb (103.9 kg)  05/28/22 231 lb (104.8 kg)  05/14/22 229 lb (103.9 kg)   BMI Readings from Last 3 Encounters:  06/01/22 33.82 kg/m  05/28/22 34.11 kg/m  05/14/22 33.82 kg/m    Assessment/Interventions: Review of patient past medical history, allergies, medications, health status, including review of consultants reports, laboratory and other test data, was performed as part of comprehensive evaluation and provision of chronic care management services.   SDOH:  (Social Determinants of Health) assessments and interventions performed: No   SDOH Screenings   Alcohol Screen: Low Risk  (06/03/2021)   Alcohol Screen    Last Alcohol Screening Score (AUDIT): 0  Depression (PHQ2-9): Medium Risk (06/03/2021)   Depression (PHQ2-9)    PHQ-2 Score: 15  Financial Resource Strain: Low Risk  (06/03/2021)   Overall Financial Resource Strain (CARDIA)    Difficulty of Paying Living Expenses: Not hard at all  Food Insecurity: No Food Insecurity (06/03/2021)   Hunger Vital Sign  Worried About Charity fundraiser in the Last Year: Never true    [place] in the Last Year: Never true  Housing: Low Risk  (06/03/2021)   Housing    Last Housing Risk Score: 0  Physical Activity: Inactive (06/03/2021)   Exercise Vital Sign    Days of Exercise per Week: 0 days    Minutes of Exercise per Session: 0 min  Social Connections: Not on file  Stress: No Stress Concern Present (06/03/2021)   [place]    Feeling of Stress : Not at all  Tobacco Use: Medium Risk (06/01/2022)   Patient History    Smoking Tobacco Use: Former    Smokeless Tobacco Use: Never    Passive Exposure: Not on file  Transportation Needs: No Transportation Needs (06/03/2021)   PRAPARE - Hydrologist (Medical): No    Lack of Transportation (Non-Medical): No    CCM Care Plan  Allergies  Allergen Reactions   Albuterol Shortness Of Breath and Other (See Comments)    Makes pt feel jittery/ tacycardic   Crestor [Rosuvastatin] Other (See Comments)    Joint pain, muscle pain, and hair loss   Halcion [Triazolam] Other (See Comments)    Dizziness,headaches,bladder problems   Levaquin [Levofloxacin In D5w] Diarrhea and Itching    Shoulder pain   Metformin And Related Rash   Naproxen Sodium     Patient tolerates in small doses. Her reaction is swelling of the lower extremities.    Cefaclor Other (See Comments)    Doesn't remember---unsure if actually allergic    Sulfa Antibiotics Itching    Unsure of reaction possibly itching   Tramadol Itching and Nausea And Vomiting   Zinc Other (See Comments)    constipation  constipation    Aripiprazole Other (See Comments)    Muscle tension/cramping   Diclofenac Sodium Rash    "made very sick"    Medications Reviewed Today     Reviewed by [name], [facility] (Pharmacist) on 07/16/22 at 0954  Med List Status: <None>   Medication Order Taking? Sig  Documenting Provider Last Dose Status Informant  aspirin EC 81 MG tablet [id number] Yes Take 81 mg by mouth at bedtime. Swallow whole. [provider] Taking Active Self  Azelastine HCl 137 MCG/SPRAY SOLN [id number] Yes PLACE 2 SPRAYS INTO BOTH NOSTRILS 2 (TWO) TIMES DAILY [name], NP Taking Active Self  B Complex-Biotin-FA (BIG 100, BIOTIN, PO) [id number] Yes Take 300 mg by mouth at bedtime. [provider] Taking Active Self  cetirizine (ZYRTEC) 10 MG tablet [id number] Yes Take 10 mg by mouth 2 (two) times daily. [provider] Taking Active Self  clindamycin (CLEOCIN T) 1 % external solution [id number] Yes Apply to scalp once or twice a day prn bumps Moye, [place], MD Taking Active Self  Continuous Blood Gluc Sensor (FREESTYLE LIBRE 3 SENSOR) [place] [id number] Yes Place 1 sensor on the skin every 14 days. Use to check glucose continuously [name], NP Taking Active   cyanocobalamin (,VITAMIN B-12,) 1000 MCG/ML injection [id number] Yes INJECT 1 ML (1,000 MCG TOTAL) INTO THE MUSCLE EVERY 30 (THIRTY) DAYS. FOR B12 VITAMIN [name], NP Taking Active   desipramine (NORPRAMIN) 25 MG tablet [id number] Yes 2  qam Plovsky, Berneta Sages, MD Taking Active   diphenhydrAMINE (BENADRYL) 2 % cream [id number] Yes Apply topically 3 (three) times daily as needed for itching. [provider] Taking Active  Self  DULoxetine HCl 40 MG CPEP [id number] Yes Take 40 mg by mouth daily. Norma Fredrickson, MD Taking Active   Evolocumab [facility] SURECLICK) 664 MG/ML Darden Palmer [id number] Yes Inject 1 Dose into the skin every 14 (fourteen) days. [name] Sable, MD Taking Active   fenofibrate (TRICOR) 145 MG tablet [id number] Yes Take 1 tablet (145 mg total) by mouth daily. For cholesterol. [name] Sable, MD Taking Active Self  ferrous sulfate 325 (65 FE) MG tablet [id number] Yes TAKE 1 TABLET BY MOUTH 2 TIMES DAILY WITH A MEAL.  Patient taking differently: Take 325 mg by mouth  daily with breakfast.   [name], NP Taking Active   hydrOXYzine (VISTARIL) 50 MG capsule [id number] Yes Take 2 capsules (100 mg total) by mouth at bedtime. TAKE 1 CAPSLE BY MOUTH AT NOON, 1 AT 6 PM, AND 1 AS NEEDED Plovsky, Gerald, MD Taking Active   ibuprofen (ADVIL) 200 MG tablet [id number] Yes Take 400 mg by mouth 2 (two) times daily. [provider] Taking Active   lamoTRIgine (LAMICTAL) 150 MG tablet [id number] Yes Take 2 tablets (300 mg total) by mouth daily at bedtime. Norma Fredrickson, MD Taking Active   Lysine 500 MG TABS [id number] Yes Take 1,000 mg by mouth daily. [provider] Taking Active Self  methocarbamol (ROBAXIN) 500 MG tablet [id number] Yes TAKE 1-2 TABLETS (500-1,000 MG TOTAL) BY MOUTH AT BEDTIME AS NEEDED FOR MUSCLE SPASMS. [name], NP Taking Active   metoprolol tartrate (LOPRESSOR) 50 MG tablet [id number]  TAKE 1 TABLET BY MOUTH TWICE A DAY Furth, Cadence H, PA-C  Active   neomycin-bacitracin-polymyxin (NEOSPORIN) [phone] ointment [id number] Yes Apply 1 application topically 4 (four) times daily as needed (for cut/scrapes.). [provider] Taking Active Self           Med Note Earna Coder Mar 10, 2021  3:56 PM)    pantoprazole (PROTONIX) 40 MG tablet [id number] Yes TAKE 1 TABLET (40 MG TOTAL) BY MOUTH 2 (TWO) TIMES DAILY BEFORE A MEAL. FOR HEARTBURN [name], NP Taking Active   polyvinyl alcohol (LIQUIFILM TEARS) 1.4 % ophthalmic solution [id number] Yes Place 2 drops into both eyes at bedtime. [provider] Taking Active Self  Ruxolitinib Phosphate (OPZELURA) 1.5 % CREA [id number] Yes Apply topically. [provider] Taking Active   Semaglutide, 1 MG/DOSE, 4 MG/3ML SOPN [id number] Yes Inject 1 mg as directed once a week. for diabetes. [name], NP Taking Active            Med Note [name]   Tue Jul 07, 2022  3:24 PM) Novo Cares PAP  STIOLTO RESPIMAT 2.5-2.5 MCG/ACT AERS  [id number] Yes Inhale 2 puffs into the lungs at bedtime. [provider] Taking Active Self           Med Note [name]   Tue Jul 07, 2022  3:24 PM) BI Cares PAP  temazepam (RESTORIL) 30 MG capsule [id number] Yes TAKE ONE TO TWO CAPSULES BY MOUTH NIGHTLY AT BEDTIME Plovsky, Berneta Sages, MD Taking Active Self  traZODone (DESYREL) 50 MG tablet [id number] Yes Take 2 tablets (100 mg total) by mouth at bedtime. Norma Fredrickson, MD Taking Active   trimethoprim (TRIMPEX) 100 MG tablet [id number] Yes Take 1 tablet (100 mg total) by mouth daily. Bjorn Loser, MD Taking Active   valACYclovir (VALTREX) 1000 MG tablet [id number] Yes Take 2 tablets by mouth twice daily for 1 day as needed for herpes outbreak. [name], NP  Taking Active Self  vitamin C (ASCORBIC ACID) 500 MG tablet [id number] Yes Take 500 mg by mouth 2 (two) times daily. [provider] Taking Active Self  Vitamin D3 (VITAMIN D) 25 MCG tablet [id number] Yes Take 3,000 Units by mouth daily. [provider] Taking Active Self            Patient Active Problem List   Diagnosis Date Noted   Rash 03/23/2022   Prolonged QT interval 03/23/2022   IDA (iron deficiency anemia)    Depression with anxiety    Hypokalemia 12/26/2021   Essential hypertension 12/25/2021   Hyponatremia 12/25/2021   Iron deficiency anemia 12/25/2021   Acute pyelonephritis 12/25/2021   Sepsis ([place]) 12/24/2021   Myalgia due to statin 11/04/2021   Swelling of lymph node 09/02/2021   Primary osteoarthritis of right hip 07/02/2021   Preop pulmonary/respiratory exam 06/27/2021   Imbalance 12/10/2020   Type 2 diabetes mellitus with hyperglycemia ([place]) 08/28/2020   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   Psoriasis 11/02/2019   Rash and nonspecific skin eruption 02/20/2019   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   Epigastric pain 12/21/2018   OSA (obstructive sleep apnea) 12/02/2018   Joint swelling 10/24/2018    Environmental and seasonal allergies 10/24/2018   Greater trochanteric bursitis of right hip 09/28/2018   Vitamin D deficiency 02/15/2018   Preventative health care 11/24/2017   B12 deficiency 05/22/2016   Dysuria 04/23/2016   Vertigo 03/16/2016   Medicare annual wellness visit, subsequent 10/25/2015   Chronic back pain 10/31/2014   Bipolar disorder ([place]) 05/21/2014   Pre-operative clearance 01/22/2014   Deformity of right foot 12/27/2013   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 09/15/2013   Insomnia 09/01/2011   Obesity 05/04/2011   HLD (hyperlipidemia) 01/20/2011   Chest pain 01/20/2011   Chronic pain syndrome 01/07/2011   RENAL CALCULUS, RECURRENT 11/13/2010   Multiple sclerosis, progressive relapsing 08/28/2010   GERD 08/28/2010   OA (osteoarthritis) of hip 08/28/2010   NEPHROLITHIASIS, HX OF 08/28/2010    Immunization History  Administered Date(s) Administered   Influenza Split 08/05/2011   Influenza, Seasonal, Injecte, Preservative Fre 06/26/2018   Influenza,inj,Quad PF,6+ Mos 08/17/2013, 07/13/2015, 07/06/2017, 06/26/2018, 06/26/2019, 07/04/2020, 09/03/2021   Influenza-Unspecified 07/08/2016, 06/18/2017, 06/26/2018   PFIZER(Purple Top)SARS-COV-2 Vaccination 02/06/2020, 02/28/2020, 07/01/2020, 03/19/2021   Pfizer Covid-19 Vaccine Bivalent Booster 75yr & up 09/03/2021   Pneumococcal Conjugate-13 07/05/2014   Pneumococcal Polysaccharide-23 07/13/2015    Conditions to be addressed/monitored:  Hyperlipidemia, Diabetes, COPD, and Asthma, MS, Tachycardia, Bipolar Disorder   Care Plan : [place] Updates made by FCharlton Haws [place]St. [place]since 07/16/2022 12:00 AM     Problem: Hyperlipidemia, Diabetes, COPD, and Asthma, MS, Tachycardia, Bipolar Disorder   Priority: High     Long-Range Goal: Disease management   Start Date: 09/15/2021  Expected End Date: 09/15/2022  This Visit's Progress: On track  Recent Progress: On track  Priority: High   Note:   Current Barriers:  None identified  Pharmacist Clinical Goal(s):  Patient will contact provider office for questions/concerns as evidenced notation of same in electronic health record through collaboration with PharmD and provider.   Interventions: 1:1 collaboration with CPleas Koch NP regarding development and update of comprehensive plan of care as evidenced by provider attestation and co-signature Inter-disciplinary care team collaboration (see longitudinal plan of care) Comprehensive medication review performed; medication list updated in electronic medical record  Diabetes (A1c goal <7%) -Controlled - A1c is at goal; pt has  stopped metformin due to skin rash; she started [employer] - Diabetes program in January and reports it has been very helpful -Pt reports Ozempic is working very well but is cost prohibitive in donut hole Reviewed AGP report: 06/24/22 to 07/07/22. Sensor active: 60%  Time in range (70-180): 97% (goal > 70%)  High (>180): 3%  Low (< 70): 0% (goal < 4%)  GMI: 6.4%; Average glucose: 129  Previous AGP report: 03/25/22 to 04/07/22  Time in range (70-180): 93% (goal > 70%)  High (181-250): 7%%  Low (< 70): 0% (goal < 4%)  GMI: 6.4%; Average glucose: 131  -Current medications: Ozempic 1 mg weekly Thurs (PAP) - Appropriate, Effective, Safe, Query Accessible Freestyle Libre 3 - Appropriate, Effective, Safe, Accessible -Medications previously tried: metformin, Trulicity -Educated on Q7M and blood sugar goals; Carbohydrate counting and/or plate method -Reviewed AGP - DM controls remains excellent -Recommend to continue current medication  Tachycardia (Goal: HR < 100) -Controlled - pt reports improvement in side effect since switching from succinate to tartrate; -Current treatment  Metoprolol tartrate 50 mg BID - Appropriate, Effective, Safe, Accessible -Recommended to continue current medication  Hyperlipidemia: (LDL goal < 100) -Controlled -  LDL is at goal; pt endorses compliance with medications; she is intolerant to statins; she uses [place] for [place] and reports recent issues with grant - requiring Medicare verification which patient reports she submitted weeks ago but has not heard any updates -Current treatment: Repatha 140 mg q14 days - Appropriate, Effective, Safe, Query Accessible Fenofibrate 145 mg daily - Appropriate, Effective, Safe, Accessible Aspirin 81 mg daily - Appropriate, Effective, Safe, Accessible -Educated on Cholesterol goals;  -Reviewed [employer] grant via online portal, it remains active -Recommended to continue current medication  COPD / asthma (Goal: control symptoms and prevent exacerbations) -Controlled - pt reports she is using Stiolto and it is working well for her, she gets it through PAP now. -Pulmonary function testing: FEV1 at 63%, ratio 72, FVC 68% (04/2020) -Current treatment  Stiolto Respimat 2.5-2.5 mcg/act - 2 puff daily (PAP)- Appropriate, Effective, Safe, Query Accessible Levalbuterol HFA prn - once a day typically - Appropriate, Effective, Safe, Accessible Levalbuterol nebulizer -Appropriate, Effective, Safe, Accessible Cetirizine 10 mg HS - Appropriate, Effective, Safe, Accessible Azelastine 137 mcg nasal spray PRN -Appropriate, Effective, Safe, Accessible -Medications previously tried: Firefighter, Advair   -Patient reports consistent use of maintenance inhaler -Frequency of rescue inhaler use: daily -Counseled on Benefits of consistent maintenance inhaler use -Recommend to continue current medication  Bipolar Depression / Anxiety / Insomnia (Goal: manage symptoms) -Controlled - pt reports current regimen is working well enough for her -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 -managed per Dr Casimiro Needle -PHQ9: 15 (05/2021) - moderate/severe depression -Current treatment: Desipramine 25 mg - 1 tab AM, 2 tab PM -Appropriate, Effective,  Safe, Accessible Duloxetine 20 mg - 2 cap daily -Appropriate, Effective, Safe, Accessible Lamotrigine 150 mg - 2 tab HS -Appropriate, Effective, Safe, Accessible Temazepam 30 mg 1-2 cap HS -Appropriate, Effective, Safe, Accessible Trazodone 50 mg - 2 tab HS -Appropriate, Effective, Safe, Accessible Hydroxyzine 50 mg - 2 tab HS -Appropriate, Effective, Safe, Accessible -Medications previously tried/failed: Rexulti -Educated on Benefits of medication for symptom control; discussed consequences of abruptly stopping medications, advised that she speak with psychiatrist if she is thinking about stopping any medications -Recommended to continue current medication  Multiple Sclerosis (Goal: manage symptoms) -Not ideally controlled  - pt has not started DMARD for MS due  to cost concerns (dimethyl fumarate was approved by insurance but over $1000 still);  -Pt follows with Neurology (Dr Melrose Nakayama) - most recently recommended Ocrevus infusions, pt reports she has not started these yet -Current treatment  Methocarbamol 500 mg - Appropriate, Effective, Safe, Accessible Ocrevus infusions per neurology - not started/pending approval -Medications previously tried/failed: Dimethyl fumarate - $1100 copay -Recommended to follow up with neurology regarding Ocrevus infusions  Anemia (Goal: improve HGB) -Improving - pt has started iron infusions with hematology (3/9, 3/16, 3/23); most recent HGB 11.2 (03/24/22), Tsat 11.8% (12/31/21) has not been checked post-infusions; she is still taking OTC iron twice daily and reports constipation -Current treatment  Ferrous sulfate 325 mg BID - Query appropriate -Advised to take iron with Vitamin C to maximize absorption -Reviewed constipation side effect - it would be reasonable to reduce oral iron to daily or every-other-day since she is s/p infusions -Recommend to continue current medication; f/u with hematology as scheduled  Pain (Goal: mange pain) -Not ideally controlled -  pt is using NSAIDs daily -Lower back pain; knee pain (fracture 03/2022) -Current treatment  Ibuprofen 200 mg - 2 tab BID Methocarbamol 500 mg  -Medications previously tried: Tylenol (does not work)  -Reviewed risks of chronic NSAID use including kidney impairment, hypertension, and GI bleeding risk; pt voiced understanding of risks and will try to limit use  Patient Goals/Self-Care Activities Patient will:  - take medications as prescribed as evidenced by patient report and record review focus on medication adherence by routine collaborate with provider on medication access solutions (Ozempic, Stiolto, Repatha)      Medication Assistance:  [employer] Primary school teacher) - approved 2023 Stiolto (Nellie) - approved 2023  [place] 11/05/21 - 11/04/22  ID: [id number]  GRP: [id number]  BIN: [id number] PCN: [id number]  Compliance/Adherence/Medication fill history: Care Gaps: None  Star-Rating Drugs: None  Medication Access: Within the past 30 days, how often has patient missed a dose of medication? 0 Is a pillbox or other method used to improve adherence? Yes  Factors that may affect medication adherence? financial need Are meds synced by current pharmacy? No  Are meds delivered by current pharmacy? No  Does patient experience delays in picking up medications due to transportation concerns? No   [facility] Reviewed: Is patient disadvantaged to use [facility]?: No  Current Rx insurance plan: HTA Name and location of Current pharmacy:  [place] [facility]Phone: [phone]: [phone] [facility] [address] [place] [place]Phone: [phone]: [phone] [facility] services reviewed with patient today?: No  Patient requests to transfer care to [facility]?: No  Reason patient declined to change  pharmacies: Not mentioned at this visit  Care Plan and Follow Up Patient Decision:  Patient agrees to Care Plan and Follow-up.  Plan: Telephone follow up appointment with care management team member scheduled for:  6 months  LCharlene Brooke PharmD, [facility] RedmondClinical Pharmacist [facility] [phone]

## 2022-07-14 ENCOUNTER — Telehealth: Payer: Self-pay

## 2022-07-14 NOTE — Chronic Care Management (AMB) (Signed)
Updated forms for PAP ozempic '1mg'$  inject once weeky .  Charlene Brooke, CPP notified  Avel Sensor, [place]  [phone]

## 2022-07-16 NOTE — Patient Instructions (Signed)
Visit Information  Phone number for Pharmacist: [phone]   Goals Addressed   None     Care Plan : [name]  Updates made by [name], [facility] since 07/16/2022 12:00 AM     Problem: Hyperlipidemia, Diabetes, COPD, and Asthma, MS, Tachycardia, Bipolar Disorder   Priority: High     Long-Range Goal: Disease management   Start Date: 09/15/2021  Expected End Date: 09/15/2022  This Visit's Progress: On track  Recent Progress: On track  Priority: High  Note:   Current Barriers:  None identified  Pharmacist Clinical Goal(s):  Patient will contact provider office for questions/concerns as evidenced notation of same in electronic health record through collaboration with PharmD and provider.   Interventions: 1:1 collaboration with [name], NP regarding development and update of comprehensive plan of care as evidenced by provider attestation and co-signature Inter-disciplinary care team collaboration (see longitudinal plan of care) Comprehensive medication review performed; medication list updated in electronic medical record  Diabetes (A1c goal <7%) -Controlled - A1c is at goal; pt has stopped metformin due to skin rash; she started [employer] - Diabetes program in January and reports it has been very helpful -Pt reports Ozempic is working very well but is cost prohibitive in donut hole Reviewed AGP report: 06/24/22 to 07/07/22. Sensor active: 60%  Time in range (70-180): 97% (goal > 70%)  High (>180): 3%  Low (< 70): 0% (goal < 4%)  GMI: 6.4%; Average glucose: 129  Previous AGP report: 03/25/22 to 04/07/22  Time in range (70-180): 93% (goal > 70%)  High (181-250): 7%%  Low (< 70): 0% (goal < 4%)  GMI: 6.4%; Average glucose: 131  -Current medications: Ozempic 1 mg weekly Thurs (PAP) - Appropriate, Effective, Safe, Query Accessible Freestyle Libre 3 - Appropriate, Effective, Safe, Accessible -Medications previously tried: metformin,  Trulicity -Educated on U9W and blood sugar goals; Carbohydrate counting and/or plate method -Reviewed AGP - DM controls remains excellent -Recommend to continue current medication  Tachycardia (Goal: HR < 100) -Controlled - pt reports improvement in side effect since switching from succinate to tartrate; -Current treatment  Metoprolol tartrate 50 mg BID - Appropriate, Effective, Safe, Accessible -Recommended to continue current medication  Hyperlipidemia: (LDL goal < 100) -Controlled - LDL is at goal; pt endorses compliance with medications; she is intolerant to statins; she uses [facility] for [place] and reports recent issues with grant - requiring Medicare verification which patient reports she submitted weeks ago but has not heard any updates -Current treatment: Repatha 140 mg q14 days - Appropriate, Effective, Safe, Query Accessible Fenofibrate 145 mg daily - Appropriate, Effective, Safe, Accessible Aspirin 81 mg daily - Appropriate, Effective, Safe, Accessible -Educated on Cholesterol goals;  -Reviewed [employer] grant via online portal, it remains active -Recommended to continue current medication  COPD / asthma (Goal: control symptoms and prevent exacerbations) -Controlled - pt reports she is using Stiolto and it is working well for her, she gets it through PAP now. -Pulmonary function testing: FEV1 at 63%, ratio 72, FVC 68% (04/2020) -Current treatment  Stiolto Respimat 2.5-2.5 mcg/act - 2 puff daily (PAP)- Appropriate, Effective, Safe, Query Accessible Levalbuterol HFA prn - once a day typically - Appropriate, Effective, Safe, Accessible Levalbuterol nebulizer -Appropriate, Effective, Safe, Accessible Cetirizine 10 mg HS - Appropriate, Effective, Safe, Accessible Azelastine 137 mcg nasal spray PRN -Appropriate, Effective, Safe, Accessible -Medications previously tried: Breo, Advair   -Patient reports consistent use of maintenance inhaler -Frequency of rescue inhaler  use: daily -Counseled  on Benefits of consistent maintenance inhaler use -Recommend to continue current medication  Bipolar Depression / Anxiety / Insomnia (Goal: manage symptoms) -Controlled - pt reports current regimen is working well enough for her -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 -managed per Dr Casimiro Needle -PHQ9: 15 (05/2021) - moderate/severe depression -Current treatment: Desipramine 25 mg - 1 tab AM, 2 tab PM -Appropriate, Effective, Safe, Accessible Duloxetine 20 mg - 2 cap daily -Appropriate, Effective, Safe, Accessible Lamotrigine 150 mg - 2 tab HS -Appropriate, Effective, Safe, Accessible Temazepam 30 mg 1-2 cap HS -Appropriate, Effective, Safe, Accessible Trazodone 50 mg - 2 tab HS -Appropriate, Effective, Safe, Accessible Hydroxyzine 50 mg - 2 tab HS -Appropriate, Effective, Safe, Accessible -Medications previously tried/failed: Rexulti -Educated on Benefits of medication for symptom control; discussed consequences of abruptly stopping medications, advised that she speak with psychiatrist if she is thinking about stopping any medications -Recommended to continue current medication  Multiple Sclerosis (Goal: manage symptoms) -Not ideally controlled  - pt has not started DMARD for MS due to cost concerns (dimethyl fumarate was approved by insurance but over $1000 still);  -Pt follows with Neurology (Dr Melrose Nakayama) - most recently recommended Ocrevus infusions, pt reports she has not started these yet -Current treatment  Methocarbamol 500 mg - Appropriate, Effective, Safe, Accessible Ocrevus infusions per neurology - not started/pending approval -Medications previously tried/failed: Dimethyl fumarate - $1100 copay -Recommended to follow up with neurology regarding Ocrevus infusions  Anemia (Goal: improve HGB) -Improving - pt has started iron infusions with hematology (3/9, 3/16, 3/23); most recent HGB 11.2 (03/24/22), Tsat 11.8%  (12/31/21) has not been checked post-infusions; she is still taking OTC iron twice daily and reports constipation -Current treatment  Ferrous sulfate 325 mg BID - Query appropriate -Advised to take iron with Vitamin C to maximize absorption -Reviewed constipation side effect - it would be reasonable to reduce oral iron to daily or every-other-day since she is s/p infusions -Recommend to continue current medication; f/u with hematology as scheduled  Pain (Goal: mange pain) -Not ideally controlled - pt is using NSAIDs daily -Lower back pain; knee pain (fracture 03/2022) -Current treatment  Ibuprofen 200 mg - 2 tab BID Methocarbamol 500 mg  -Medications previously tried: Tylenol (does not work)  -Reviewed risks of chronic NSAID use including kidney impairment, hypertension, and GI bleeding risk; pt voiced understanding of risks and will try to limit use  Patient Goals/Self-Care Activities Patient will:  - take medications as prescribed as evidenced by patient report and record review focus on medication adherence by routine collaborate with provider on medication access solutions (Ozempic, Stiolto, Repatha)      Patient verbalizes understanding of instructions and care plan provided today and agrees to view in [place]. Active MyChart status and patient understanding of how to access instructions and care plan via MyChart confirmed with patient.    Telephone follow up appointment with pharmacy team member scheduled for: 6 months  Charlene Brooke, PharmD, [facility] Clinical Pharmacist [facility] [phone]

## 2022-07-27 ENCOUNTER — Ambulatory Visit: Payer: PPO | Admitting: Podiatry

## 2022-08-04 ENCOUNTER — Telehealth (HOSPITAL_BASED_OUTPATIENT_CLINIC_OR_DEPARTMENT_OTHER): Payer: PPO | Admitting: Psychiatry

## 2022-08-04 DIAGNOSIS — F3160 Bipolar disorder, current episode mixed, unspecified: Secondary | ICD-10-CM | POA: Diagnosis not present

## 2022-08-04 DIAGNOSIS — F310 Bipolar disorder, current episode hypomanic: Secondary | ICD-10-CM

## 2022-08-04 MED ORDER — TEMAZEPAM 30 MG PO CAPS: ORAL_CAPSULE | ORAL | 5 refills | Status: DC

## 2022-08-04 MED ORDER — DULOXETINE HCL 40 MG PO CPEP: 20.0000 mg | ORAL_CAPSULE | Freq: Two times a day (BID) | ORAL | 4 refills | Status: DC

## 2022-08-04 MED ORDER — LAMOTRIGINE 150 MG PO TABS: ORAL_TABLET | ORAL | 1 refills | Status: DC

## 2022-08-04 MED ORDER — TRAZODONE HCL 50 MG PO TABS: 100.0000 mg | ORAL_TABLET | Freq: Every day | ORAL | 7 refills | Status: DC

## 2022-08-04 NOTE — Progress Notes (Signed)
The most painful.  This  Psychiatric Initial Adult Assessment   Patient Identification: Emily Moon MRN:  [id number] Date of Evaluation:  08/04/2022 Referral Source: From the community Chief Complaint: Feels exhausted   Today the patient seems to be at her baseline.  She described herself as lonely and isolated.  She is having conflict with her son.  Fortunately she does have 3 friends that she connects with.  The patient complains of being overweight.  The good news is she started with therapy with a therapist whose name is Eugenia Pancoast.  She just started with him.  The patient could not tolerate a higher dose of Lamictal is going back to 150 mg 2 a day.  She could not tolerate lower dose of Restoril so she is back to 60 mg.  Patient denies any use of drugs or alcohol.  She continues taking trazodone and desipramine.  The patient is her normal talkative self.  The patient has to find some direction and some specific things that she wants to do with her life.  Procedure(s) Performed: * No surgery found *  Anesthes  Patient location:   Post pain:   Post assessment:   Last Vitals:  There were no vitals filed for this visit.  Post vital signs:   Level of consciousness:   Complications: . Associated Signs/Symptoms: Depression Symptoms:  hopelessness, (Hypo) Manic Symptoms:   Anxiety Symptoms:   Psychotic Symptoms:   PTSD Symptoms:   Past Psychiatric History: The patient was seen in a psychiatric office and takes multiple medications she's not been recently hospitalized.  Previous Psychotropic Medications: Yes   Substance Abuse History in the last 12 months:  No.  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   History of kidney stones    Hypercholesterolemia    IDA (iron deficiency anemia)    Multiple sclerosis (HCC)    weakness   Osteoporosis    osteoarthritis   Pneumonia    PONV (postoperative nausea and vomiting)    no problem after cataract surgery   Prediabetes 07/22/2018   Psoriasis    Shortness of breath dyspnea    wheezing   Sleep apnea 2012   sleep study / slight, no interventions   Urinary frequency    Weight gain 06/21/2014    Past Surgical History:  Procedure Laterality Date   CATARACT EXTRACTION W/PHACO Left 05/21/2015   Procedure: CATARACT EXTRACTION PHACO AND INTRAOCULAR LENS PLACEMENT ([place]);  Surgeon: Birder Robson, MD;  Location: [facility];  Service: Ophthalmology;  Laterality: Left;  [place] 00:35 AP% 22.9 CDE 8.11 fluid pack lot #[id number]   CATARACT EXTRACTION W/PHACO Right 06/04/2015   Procedure: CATARACT EXTRACTION PHACO AND INTRAOCULAR LENS PLACEMENT (IOC);  Surgeon: Birder Robson, MD;  Location: [facility];  Service: Ophthalmology;  Laterality: Right;  US:00:48 AP%: 10.5 CDE:5.08 Fluid lot #  [id number]   CYSTOSCOPY/URETEROSCOPY/HOLMIUM LASER/STENT PLACEMENT Bilateral 09/22/2016   Procedure: CYSTOSCOPY/URETEROSCOPY/HOLMIUM LASER/STENT PLACEMENT;  Surgeon: Hollice Espy, MD;  Location: [facility];  Service: Urology;  Laterality: Bilateral;   EYE SURGERY  2015   tissue biopsy   FOOT SURGERY  2015   JOINT REPLACEMENT Left 2013   hip replacement   LITHOTRIPSY     PTOSIS REPAIR Bilateral 02/18/2016   Procedure: BILATERAL PTOSIS REPAIR UPPER EYELIDS;  Surgeon: Karle Starch, MD;  Location: [place];  Service: Ophthalmology;  Laterality: Bilateral;  LEAVE PT EARLY AM    thumb surgery Right    TONSILLECTOMY  1973   TOTAL HIP ARTHROPLASTY Right 07/02/2021   Procedure: TOTAL HIP ARTHROPLASTY ANTERIOR APPROACH;  Surgeon: Gaynelle Arabian, MD;  Location: [facility];  Service: Orthopedics;  Laterality: Right;    Family Psychiatric History:   Family History:  Family History  Problem Relation Age of Onset   Cancer Father        Abdomen with mastasis   Cancer Mother    Heart disease Mother    Kidney disease Neg Hx    Bladder Cancer Neg Hx    Prostate cancer Neg Hx    Kidney cancer Neg Hx     Social History:   Social History   Socioeconomic History   Marital status: Divorced    Spouse name: Not on file   Number of children: 1   Years of education: Not on file   Highest education level: Not on file  Occupational History   Occupation: Therapist, 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  Tobacco Use   Smoking status: Former    Packs/day: 1.00    Years: 25.00    Total pack years: 25.00    Types: Cigarettes    Quit date: 2019    Years since quitting: 4.7   Smokeless tobacco: Never   Tobacco comments:    occasional use  Vaping Use   Vaping Use: Former  Substance and Sexual Activity   Alcohol use: No    Alcohol/week: 0.0 standard drinks of alcohol   Drug use: Yes    Types: Methylphenidate, Marijuana    Comment: Delta 8 - once per day   Sexual activity: Never  Other Topics Concern   Not on file  Social History Narrative   Not on file   Social Determinants of Health   Financial Resource Strain: Low Risk  (06/03/2021)   Overall Financial Resource Strain (CARDIA)    Difficulty of Paying Living Expenses: Not hard at all  Food Insecurity: No Food Insecurity (06/03/2021)   Hunger Vital Sign    Worried About Running Out of Food in the Last Year: Never true    [place] in the Last Year: Never true  Transportation Needs: No Transportation Needs (06/03/2021)   PRAPARE - Hydrologist (Medical): No    Lack of  Transportation (Non-Medical): No  Physical Activity: Inactive (06/03/2021)   Exercise Vital Sign    Days of Exercise per Week: 0 days    Minutes of Exercise per Session: 0 min  Stress: No Stress Concern Present (06/03/2021)   [place]    Feeling of Stress : Not at all  Social Connections: Not on file    Additional Social History:   Allergies:   Allergies  Allergen Reactions   Albuterol Shortness Of Breath and Other (See Comments)    Makes pt  feel jittery/ tacycardic   Crestor [Rosuvastatin] Other (See Comments)    Joint pain, muscle pain, and hair loss   Halcion [Triazolam] Other (See Comments)    Dizziness,headaches,bladder problems   Levaquin [Levofloxacin In D5w] Diarrhea and Itching    Shoulder pain   Metformin And Related Rash   Naproxen Sodium     Patient tolerates in small doses. Her reaction is swelling of the lower extremities.    Cefaclor Other (See Comments)    Doesn't remember---unsure if actually allergic    Sulfa Antibiotics Itching    Unsure of reaction possibly itching   Tramadol Itching and Nausea And Vomiting   Zinc Other (See Comments)    constipation  constipation    Aripiprazole Other (See Comments)    Muscle tension/cramping   Diclofenac Sodium Rash    "made very sick"    Metabolic Disorder Labs: Lab Results  Component Value Date   HGBA1C 6.5 (A) 04/01/2022   MPG 142.72 07/02/2021   No results found for: "PROLACTIN" Lab Results  Component Value Date   CHOL 159 03/10/2021   TRIG 189.0 (H) 03/10/2021   HDL 39.90 03/10/2021   CHOLHDL 4 03/10/2021   VLDL 37.8 03/10/2021   LDLCALC 81 03/10/2021   LDLCALC 223 (H) 11/13/2014     Current Medications: Current Outpatient Medications  Medication Sig Dispense Refill   aspirin EC 81 MG tablet Take 81 mg by mouth at bedtime. Swallow whole.     Azelastine HCl 137 MCG/SPRAY SOLN PLACE 2 SPRAYS INTO BOTH NOSTRILS 2 (TWO) TIMES DAILY  30 mL 5   B Complex-Biotin-FA (BIG 100, BIOTIN, PO) Take 300 mg by mouth at bedtime.     cetirizine (ZYRTEC) 10 MG tablet Take 10 mg by mouth 2 (two) times daily.     clindamycin (CLEOCIN T) 1 % external solution Apply to scalp once or twice a day prn bumps 30 mL 2   Continuous Blood Gluc Sensor (FREESTYLE LIBRE 3 SENSOR) MISC Place 1 sensor on the skin every 14 days. Use to check glucose continuously 6 each 1   cyanocobalamin (,VITAMIN B-12,) 1000 MCG/ML injection INJECT 1 ML (1,000 MCG TOTAL) INTO THE MUSCLE EVERY 30 (THIRTY) DAYS. FOR B12 VITAMIN 3 mL 0   desipramine (NORPRAMIN) 25 MG tablet 2  qam 60 tablet 6   diphenhydrAMINE (BENADRYL) 2 % cream Apply topically 3 (three) times daily as needed for itching.     DULoxetine HCl 40 MG CPEP Take 20 mg by mouth 2 (two) times daily. 2 q day 60 capsule 4   Evolocumab (REPATHA SURECLICK) 517 MG/ML SOAJ Inject 1 Dose into the skin every 14 (fourteen) days. 2 mL 5   fenofibrate (TRICOR) 145 MG tablet Take 1 tablet (145 mg total) by mouth daily. For cholesterol. 90 tablet 2   ferrous sulfate 325 (65 FE) MG tablet TAKE 1 TABLET BY MOUTH 2 TIMES DAILY WITH A MEAL. (Patient taking differently: Take 325 mg by mouth daily with breakfast.) 180 tablet 0   hydrOXYzine (VISTARIL) 50 MG capsule Take 2 capsules (100 mg total) by mouth at bedtime. TAKE 1 CAPSLE BY MOUTH AT NOON, 1 AT 6 PM, AND 1 AS NEEDED 90 capsule 3   ibuprofen (ADVIL) 200 MG tablet Take 400 mg by mouth 2 (two) times daily.     lamoTRIgine (LAMICTAL) 150 MG tablet Take 2 tablets (300 mg total) by mouth daily at bedtime. 60 tablet 1   Lysine 500 MG TABS Take 1,000 mg by mouth daily.  methocarbamol (ROBAXIN) 500 MG tablet TAKE 1-2 TABLETS (500-1,000 MG TOTAL) BY MOUTH AT BEDTIME AS NEEDED FOR MUSCLE SPASMS. 180 tablet 0   metoprolol tartrate (LOPRESSOR) 50 MG tablet TAKE 1 TABLET BY MOUTH TWICE A DAY 180 tablet 0   neomycin-bacitracin-polymyxin (NEOSPORIN) [id number] ointment Apply 1 application  topically 4 (four) times daily as needed (for cut/scrapes.).     pantoprazole (PROTONIX) 40 MG tablet TAKE 1 TABLET (40 MG TOTAL) BY MOUTH 2 (TWO) TIMES DAILY BEFORE A MEAL. FOR HEARTBURN 180 tablet 3   polyvinyl alcohol (LIQUIFILM TEARS) 1.4 % ophthalmic solution Place 2 drops into both eyes at bedtime.     Ruxolitinib Phosphate (OPZELURA) 1.5 % CREA Apply topically.     Semaglutide, 1 MG/DOSE, 4 MG/3ML SOPN Inject 1 mg as directed once a week. for diabetes. 9 mL 0   STIOLTO RESPIMAT 2.5-2.5 MCG/ACT AERS Inhale 2 puffs into the lungs at bedtime.     temazepam (RESTORIL) 30 MG capsule TAKE ONE TO TWO CAPSULES BY MOUTH NIGHTLY AT BEDTIME 60 capsule 5   traZODone (DESYREL) 50 MG tablet Take 2 tablets (100 mg total) by mouth at bedtime. 60 tablet 7   trimethoprim (TRIMPEX) 100 MG tablet Take 1 tablet (100 mg total) by mouth daily. 30 tablet 11   valACYclovir (VALTREX) 1000 MG tablet Take 2 tablets by mouth twice daily for 1 day as needed for herpes outbreak. 12 tablet 0   vitamin C (ASCORBIC ACID) 500 MG tablet Take 500 mg by mouth 2 (two) times daily.     Vitamin D3 (VITAMIN D) 25 MCG tablet Take 3,000 Units by mouth daily.     No current facility-administered medications for this visit.    Neurologic: Headache: No Seizure: No Paresthesias:No  Musculoskeletal: Strength & Muscle Tone: within normal limits Gait & Station: normal Patient leans: N/A  Psychiatric Specialty Exam: ROS  Last menstrual period 08/23/2014.There is no height or weight on file to calculate BMI.  General Appearance: Casual  Eye Contact:  Good  Speech:  Clear and Coherent  Volume:  Normal  Mood:  Negative  Affect:  Appropriate  Thought Process:  Goal Directed  Orientation:  NA  Thought Content:  Logical  Suicidal Thoughts:  No  Homicidal Thoughts:  No  Memory:  Negative  Judgement:  Good  Insight:  Good  Psychomotor Activity:  Normal  Concentration:    Recall:    Fund of Knowledge:Good  Language: Good   Akathisia:  No  Handed:  Right  AIMS (if indicated):    Assets:  Desire for Improvement  ADL's:  Intact  Cognition: WNL  Sleep:      9/19/20231:58 PM       This patient's diagnosis is bipolar disorder.  We will clarify that her dose of Lamictal should be 300 mg.  She will take 150 mg taking 2 at night.  She also takes Cymbalta and could not tolerate the higher dose so she is on the lower dose of 20 mg pill 2 a day.  Her second problem is insomnia.  The patient is on a CPAP machine but she will go back to taking Restoril 60 mg.  She also takes trazodone as well.  The patient is going to start in therapy which is the best thing for her.  She will return to talk to me in about 4 months.  She is not suicidal.  She is functioning fairly well.  She clearly is at her baseline.

## 2022-08-05 ENCOUNTER — Telehealth: Payer: Self-pay

## 2022-08-05 NOTE — Telephone Encounter (Signed)
Spoke with patient regarding Ozempic pickup. She will come by during normal business hours.

## 2022-08-06 ENCOUNTER — Other Ambulatory Visit: Payer: Self-pay | Admitting: Primary Care

## 2022-08-06 DIAGNOSIS — E538 Deficiency of other specified B group vitamins: Secondary | ICD-10-CM

## 2022-08-10 ENCOUNTER — Telehealth: Payer: Self-pay | Admitting: Pharmacist

## 2022-08-10 ENCOUNTER — Encounter: Payer: Self-pay | Admitting: Urology

## 2022-08-10 ENCOUNTER — Ambulatory Visit (INDEPENDENT_AMBULATORY_CARE_PROVIDER_SITE_OTHER): Payer: PPO | Admitting: Urology

## 2022-08-10 VITALS — BP 136/80 | HR 111 | Ht 69.0 in

## 2022-08-10 DIAGNOSIS — R350 Frequency of micturition: Secondary | ICD-10-CM

## 2022-08-10 DIAGNOSIS — R3915 Urgency of urination: Secondary | ICD-10-CM | POA: Diagnosis not present

## 2022-08-10 LAB — URINALYSIS, COMPLETE
Bilirubin, UA: NEGATIVE
Glucose, UA: NEGATIVE
Ketones, UA: NEGATIVE
Leukocytes,UA: NEGATIVE
Nitrite, UA: NEGATIVE
Protein,UA: NEGATIVE
Specific Gravity, UA: 1.03 (ref 1.005–1.030)
Urobilinogen, Ur: 0.2 mg/dL (ref 0.2–1.0)
pH, UA: 5 (ref 5.0–7.5)

## 2022-08-10 LAB — MICROSCOPIC EXAMINATION: Bacteria, UA: NONE SEEN

## 2022-08-10 MED ORDER — TAMSULOSIN HCL 0.4 MG PO CAPS: 0.4000 mg | ORAL_CAPSULE | Freq: Every day | ORAL | 11 refills | Status: DC

## 2022-08-10 NOTE — Progress Notes (Signed)
[facility])                                            [place] Team                                        Statin Quality Measure Assessment    08/10/2022  Emily Moon [birth year] [id number]   Per review of chart and payor information, patient has a diagnosis of diabetes but is not currently filling a statin prescription.  This places patient into the SUPD (Statin Use In Patients with Diabetes) measure for CMS.    Patient has documented trial of rosuvastatin with reported joint pain and muscle pain, treated with PCSK9 inhibitor.   The 10-year ASCVD risk score (Arnett DK, et al., 2019) is: 6.5%   Values used to calculate the score:     Age: 17 years     Sex: Female     Is Non-Hispanic African American: No     Diabetic: Yes     Tobacco smoker: No     Systolic Blood Pressure: 332 mmHg     Is BP treated: Yes     HDL Cholesterol: 39.9 mg/dL     Total Cholesterol: 159 mg/dL 03/10/2021     Component Value Date/Time   CHOL 159 03/10/2021 1628   TRIG 189.0 (H) 03/10/2021 1628   HDL 39.90 03/10/2021 1628   CHOLHDL 4 03/10/2021 1628   VLDL 37.8 03/10/2021 1628   LDLCALC 81 03/10/2021 1628   LDLDIRECT 53.0 08/01/2020 1254    Please consider the following:  Code for past statin intolerance or  other exclusions (required annually)  Provider Requirements: Assess prior statin intolerance and associate code during an office visit or audiovisual encounter  Drug Induced Myopathy G72.0   Myopathy, unspecified G72.9   Myositis, unspecified M60.9   Rhabdomyolysis M62.82    Loretha Brasil, PharmD [place] Pharmacist Office: [phone]

## 2022-08-10 NOTE — Progress Notes (Signed)
08/10/2022 1:45 PM   Emily Moon 08-Jan-1961 [id number]  Referring provider: [name], NP [place],  [place] [address]  Chief Complaint  Patient presents with   Cysto    HPI: I saw the patient in 2019 and she was cleared for microscopic hematuria.  In the last year she has been admitted twice with sepsis with fever of 102 burning and straining to urinate.  She has a distant history of a kidney stone.  Has not had bladder infections otherwise.  She also describes getting infections perhaps after her stent removed  At baseline she voids every 1 hour and cannot hold it for 2 hours.  Gets up once a night.  She is continent   Discharge summary noted pyelonephritis diabetes fibromyalgia and UTI symptoms with a white blood count of 14.1.  She had E. coli in the urine.  CT scan demonstrated decreased perfusion right kidney that was subtle in keeping of pyelonephritis and no stones or hydronephrosis    Because of 2 episodes of sepsis we talked about pathophysiology and prophylaxis.  Reassess in about 2 months with cystoscopy on daily trimethoprim 100 mg 30x11.  Call if culture positive  Today Frequency stable.  Last culture negative Patient still has frequency at times she feels she cannot urinate well.  She has multiple sclerosis.  Clinically not infected today  On pelvic examination no prolapse.  No stress incontinence  Cystoscopy: Patient underwent flexible cystoscopy.  Bladder mucosa and trigone were normal.  No foreign body.  No cystitis.  Urine had a few white flecks and was sent for culture  Patient has microscopic hematuria ongoing but has been cleared with today cystoscopy and CT scan Mar 23, 2022    PMH: Past Medical History:  Diagnosis Date   Arthritis    osteo   Asthma    Bipolar disorder ([place]) 05/21/2014   Cataracts, bilateral    COPD (chronic obstructive pulmonary disease) ([place])    DDD (degenerative disc disease), cervical    also back    Depression    Diabetes mellitus without complication (HCC)    Dizziness    Positional   Edema    feet/legs   Fibromyalgia syndrome    Fungal infection    Finger nails   GERD (gastroesophageal reflux disease)    Gout    Heart palpitations    Hip dysplasia, congenital 09/15/2013   History of kidney stones    Hypercholesterolemia    IDA (iron deficiency anemia)    Multiple sclerosis (HCC)    weakness   Osteoporosis    osteoarthritis   Pneumonia    PONV (postoperative nausea and vomiting)    no problem after cataract surgery   Prediabetes 07/22/2018   Psoriasis    Shortness of breath dyspnea    wheezing   Sleep apnea 2012   sleep study / slight, no interventions   Urinary frequency    Weight gain 06/21/2014    Surgical History: Past Surgical History:  Procedure Laterality Date   CATARACT EXTRACTION W/PHACO Left 05/21/2015   Procedure: CATARACT EXTRACTION PHACO AND INTRAOCULAR LENS PLACEMENT ([place]);  Surgeon: Birder Robson, MD;  Location: [facility] ORS;  Service: Ophthalmology;  Laterality: Left;  [place] 00:35 AP% 22.9 CDE 8.11 fluid pack lot #[id number]   CATARACT EXTRACTION W/PHACO Right 06/04/2015   Procedure: CATARACT EXTRACTION PHACO AND INTRAOCULAR LENS PLACEMENT ([facility]);  Surgeon: Birder Robson, MD;  Location: [facility] ORS;  Service: Ophthalmology;  Laterality: Right;  US:00:48  AP%: 10.5 CDE:5.08 Fluid lot #[id number]   CYSTOSCOPY/URETEROSCOPY/HOLMIUM LASER/STENT PLACEMENT Bilateral 09/22/2016   Procedure: CYSTOSCOPY/URETEROSCOPY/HOLMIUM LASER/STENT PLACEMENT;  Surgeon: Hollice Espy, MD;  Location: [facility] ORS;  Service: Urology;  Laterality: Bilateral;   EYE SURGERY  2015   tissue biopsy   FOOT SURGERY  2015   JOINT REPLACEMENT Left 2013   hip replacement   LITHOTRIPSY     PTOSIS REPAIR Bilateral 02/18/2016   Procedure: BILATERAL PTOSIS REPAIR UPPER EYELIDS;  Surgeon: Karle Starch, MD;  Location: [facility];  Service: Ophthalmology;  Laterality: Bilateral;  LEAVE PT  EARLY AM   thumb surgery Right    TONSILLECTOMY  1973   TOTAL HIP ARTHROPLASTY Right 07/02/2021   Procedure: TOTAL HIP ARTHROPLASTY ANTERIOR APPROACH;  Surgeon: Gaynelle Arabian, MD;  Location: [facility];  Service: Orthopedics;  Laterality: Right;    Home Medications:  Allergies as of 08/10/2022       Reactions   Albuterol Shortness Of Breath, Other (See Comments)   Makes pt feel jittery/ tacycardic   Crestor [rosuvastatin] Other (See Comments)   Joint pain, muscle pain, and hair loss   Halcion [triazolam] Other (See Comments)   Dizziness,headaches,bladder problems   Levaquin [levofloxacin In D5w] Diarrhea, Itching   Shoulder pain   Metformin And Related Rash   Naproxen Sodium    Patient tolerates in small doses. Her reaction is swelling of the lower extremities.    Cefaclor Other (See Comments)   Doesn't remember---unsure if actually allergic    Sulfa Antibiotics Itching   Unsure of reaction possibly itching   Tramadol Itching, Nausea And Vomiting   Zinc Other (See Comments)   constipation  constipation    Aripiprazole Other (See Comments)   Muscle tension/cramping   Diclofenac Sodium Rash   "made very sick"        Medication List        Accurate as of August 10, 2022  1:45 PM. If you have any questions, ask your nurse or doctor.          ascorbic acid 500 MG tablet Commonly known as: VITAMIN C Take 500 mg by mouth 2 (two) times daily.   aspirin EC 81 MG tablet Take 81 mg by mouth at bedtime. Swallow whole.   Azelastine HCl 137 MCG/SPRAY Soln PLACE 2 SPRAYS INTO BOTH NOSTRILS 2 (TWO) TIMES DAILY   BIG 100 (BIOTIN) PO Take 300 mg by mouth at bedtime.   cetirizine 10 MG tablet Commonly known as: ZYRTEC Take 10 mg by mouth 2 (two) times daily.   clindamycin 1 % external solution Commonly known as: CLEOCIN T Apply to scalp once or twice a day prn bumps   cyanocobalamin 1000 MCG/ML injection Commonly known as: VITAMIN B12 INJECT 1 ML (1,000 MCG TOTAL)  INTO THE MUSCLE EVERY 30 (THIRTY) DAYS. FOR B12 VITAMIN   desipramine 25 MG tablet Commonly known as: NORPRAMIN 2  qam   diphenhydrAMINE 2 % cream Commonly known as: BENADRYL Apply topically 3 (three) times daily as needed for itching.   DULoxetine HCl 40 MG Cpep Take 20 mg by mouth 2 (two) times daily. 2 q day   fenofibrate 145 MG tablet Commonly known as: TRICOR Take 1 tablet (145 mg total) by mouth daily. For cholesterol.   ferrous sulfate 325 (65 FE) MG tablet TAKE 1 TABLET BY MOUTH 2 TIMES DAILY WITH A MEAL. What changed: when to take this   FreeStyle Libre 3 Sensor Misc Place 1 sensor on the skin every 14 days.  Use to check glucose continuously   hydrOXYzine 50 MG capsule Commonly known as: VISTARIL Take 2 capsules (100 mg total) by mouth at bedtime. TAKE 1 CAPSLE BY MOUTH AT NOON, 1 AT 6 PM, AND 1 AS NEEDED   ibuprofen 200 MG tablet Commonly known as: ADVIL Take 400 mg by mouth 2 (two) times daily.   lamoTRIgine 150 MG tablet Commonly known as: LAMICTAL Take 2 tablets (300 mg total) by mouth daily at bedtime.   Lysine 500 MG Tabs Take 1,000 mg by mouth daily.   methocarbamol 500 MG tablet Commonly known as: ROBAXIN TAKE 1-2 TABLETS (500-1,000 MG TOTAL) BY MOUTH AT BEDTIME AS NEEDED FOR MUSCLE SPASMS.   metoprolol tartrate 50 MG tablet Commonly known as: LOPRESSOR TAKE 1 TABLET BY MOUTH TWICE A DAY   neomycin-bacitracin-polymyxin [id number] ointment Apply 1 application topically 4 (four) times daily as needed (for cut/scrapes.).   Opzelura 1.5 % Crea Generic drug: Ruxolitinib Phosphate Apply topically.   pantoprazole 40 MG tablet Commonly known as: PROTONIX TAKE 1 TABLET (40 MG TOTAL) BY MOUTH 2 (TWO) TIMES DAILY BEFORE A MEAL. FOR HEARTBURN   polyvinyl alcohol 1.4 % ophthalmic solution Commonly known as: LIQUIFILM TEARS Place 2 drops into both eyes at bedtime.   Repatha SureClick 409 MG/ML So[place] Generic drug: Evolocumab Inject 1 Dose into the skin  every 14 (fourteen) days.   Semaglutide (1 MG/DOSE) 4 MG/3ML Sopn Inject 1 mg as directed once a week. for diabetes.   Stiolto Respimat 2.5-2.5 MCG/ACT Aers Generic drug: Tiotropium Bromide-Olodaterol Inhale 2 puffs into the lungs at bedtime.   temazepam 30 MG capsule Commonly known as: RESTORIL TAKE ONE TO TWO CAPSULES BY MOUTH NIGHTLY AT BEDTIME   traZODone 50 MG tablet Commonly known as: DESYREL Take 2 tablets (100 mg total) by mouth at bedtime.   trimethoprim 100 MG tablet Commonly known as: TRIMPEX Take 1 tablet (100 mg total) by mouth daily.   valACYclovir 1000 MG tablet Commonly known as: VALTREX Take 2 tablets by mouth twice daily for 1 day as needed for herpes outbreak.   vitamin D3 25 MCG tablet Commonly known as: CHOLECALCIFEROL Take 3,000 Units by mouth daily.        Allergies:  Allergies  Allergen Reactions   Albuterol Shortness Of Breath and Other (See Comments)    Makes pt feel jittery/ tacycardic   Crestor [Rosuvastatin] Other (See Comments)    Joint pain, muscle pain, and hair loss   Halcion [Triazolam] Other (See Comments)    Dizziness,headaches,bladder problems   Levaquin [Levofloxacin In D5w] Diarrhea and Itching    Shoulder pain   Metformin And Related Rash   Naproxen Sodium     Patient tolerates in small doses. Her reaction is swelling of the lower extremities.    Cefaclor Other (See Comments)    Doesn't remember---unsure if actually allergic    Sulfa Antibiotics Itching    Unsure of reaction possibly itching   Tramadol Itching and Nausea And Vomiting   Zinc Other (See Comments)    constipation  constipation    Aripiprazole Other (See Comments)    Muscle tension/cramping   Diclofenac Sodium Rash    "made very sick"    Family History: Family History  Problem Relation Age of Onset   Cancer Father        Abdomen with mastasis   Cancer Mother    Heart disease Mother    Kidney disease Neg Hx    Bladder Cancer Neg Hx    Prostate  cancer Neg Hx    Kidney cancer Neg Hx     Social History:  reports that she quit smoking about 4 years ago. Her smoking use included cigarettes. She has a 25.00 pack-year smoking history. She has been exposed to tobacco smoke. She has never used smokeless tobacco. She reports current drug use. Drugs: Methylphenidate and Marijuana. She reports that she does not drink alcohol.  ROS:                                        Physical Exam: BP 136/80   Pulse (!) 111   Ht '5\' 9"'$  (1.753 m)   LMP 08/23/2014   BMI 33.82 kg/m   Constitutional:  Alert and oriented, No acute distress. HEENT: [place] AT, moist mucus membranes.  Trachea midline, no masses.   Laboratory Data: Lab Results  Component Value Date   WBC 7.9 05/28/2022   HGB 12.8 05/28/2022   HCT 38.0 05/28/2022   MCV 95.0 05/28/2022   PLT 431 (H) 05/28/2022    Lab Results  Component Value Date   CREATININE 1.09 05/14/2022    No results found for: "PSA"  No results found for: "TESTOSTERONE"  Lab Results  Component Value Date   HGBA1C 6.5 (A) 04/01/2022    Urinalysis    Component Value Date/Time   COLORURINE YELLOW (A) 03/23/2022 1148   APPEARANCEUR Hazy (A) 06/01/2022 1321   LABSPEC 1.016 03/23/2022 1148   PHURINE 5.0 03/23/2022 1148   GLUCOSEU Negative 06/01/2022 1321   HGBUR NEGATIVE 03/23/2022 1148   HGBUR moderate 11/13/2010 0945   BILIRUBINUR Negative 06/01/2022 1321   KETONESUR NEGATIVE 03/23/2022 1148   PROTEINUR Trace (A) 06/01/2022 1321   PROTEINUR 100 (A) 03/23/2022 1148   UROBILINOGEN 0.2 05/14/2022 1140   UROBILINOGEN 0.2 11/13/2010 0945   NITRITE Negative 06/01/2022 1321   NITRITE NEGATIVE 03/23/2022 1148   LEUKOCYTESUR 1+ (A) 06/01/2022 1321   LEUKOCYTESUR LARGE (A) 03/23/2022 1148    Pertinent Imaging:   Assessment & Plan: Patient will get the sense that she needs to urinate and then cannot go.  She has had this for years multiple sclerosis for years.  She would like to  try Flomax and may have tried it years ago.  See in 8 weeks on Flomax with a residual.  Stay on trimethoprim and call if culture positive she has been cleared for infections and blood in urine  1. Urinary urgency  - Urinalysis, Complete   No follow-ups on file.  Reece Packer, MD  [facility] [address], [place], [place] [address] [phone]

## 2022-08-11 ENCOUNTER — Encounter: Payer: PPO | Admitting: Primary Care

## 2022-08-12 ENCOUNTER — Ambulatory Visit: Payer: PPO | Admitting: Podiatry

## 2022-08-13 LAB — CULTURE, URINE COMPREHENSIVE

## 2022-08-20 ENCOUNTER — Encounter: Payer: Self-pay | Admitting: Primary Care

## 2022-08-20 ENCOUNTER — Ambulatory Visit (INDEPENDENT_AMBULATORY_CARE_PROVIDER_SITE_OTHER): Payer: PPO | Admitting: Primary Care

## 2022-08-20 VITALS — BP 98/60 | HR 88 | Temp 97.0°F | Ht 69.0 in | Wt 243.0 lb

## 2022-08-20 DIAGNOSIS — J3089 Other allergic rhinitis: Secondary | ICD-10-CM

## 2022-08-20 DIAGNOSIS — Z0000 Encounter for general adult medical examination without abnormal findings: Secondary | ICD-10-CM | POA: Diagnosis not present

## 2022-08-20 DIAGNOSIS — Z1231 Encounter for screening mammogram for malignant neoplasm of breast: Secondary | ICD-10-CM

## 2022-08-20 DIAGNOSIS — Z1211 Encounter for screening for malignant neoplasm of colon: Secondary | ICD-10-CM

## 2022-08-20 DIAGNOSIS — E785 Hyperlipidemia, unspecified: Secondary | ICD-10-CM

## 2022-08-20 DIAGNOSIS — D509 Iron deficiency anemia, unspecified: Secondary | ICD-10-CM

## 2022-08-20 DIAGNOSIS — R39198 Other difficulties with micturition: Secondary | ICD-10-CM

## 2022-08-20 DIAGNOSIS — G4733 Obstructive sleep apnea (adult) (pediatric): Secondary | ICD-10-CM

## 2022-08-20 DIAGNOSIS — I10 Essential (primary) hypertension: Secondary | ICD-10-CM | POA: Diagnosis not present

## 2022-08-20 DIAGNOSIS — Z87440 Personal history of urinary (tract) infections: Secondary | ICD-10-CM

## 2022-08-20 DIAGNOSIS — G4700 Insomnia, unspecified: Secondary | ICD-10-CM

## 2022-08-20 DIAGNOSIS — E1165 Type 2 diabetes mellitus with hyperglycemia: Secondary | ICD-10-CM

## 2022-08-20 DIAGNOSIS — F3170 Bipolar disorder, currently in remission, most recent episode unspecified: Secondary | ICD-10-CM | POA: Diagnosis not present

## 2022-08-20 DIAGNOSIS — J4489 Other specified chronic obstructive pulmonary disease: Secondary | ICD-10-CM | POA: Diagnosis not present

## 2022-08-20 DIAGNOSIS — K219 Gastro-esophageal reflux disease without esophagitis: Secondary | ICD-10-CM | POA: Diagnosis not present

## 2022-08-20 DIAGNOSIS — G35 Multiple sclerosis: Secondary | ICD-10-CM

## 2022-08-20 DIAGNOSIS — L409 Psoriasis, unspecified: Secondary | ICD-10-CM | POA: Diagnosis not present

## 2022-08-20 DIAGNOSIS — B009 Herpesviral infection, unspecified: Secondary | ICD-10-CM | POA: Diagnosis not present

## 2022-08-20 DIAGNOSIS — E538 Deficiency of other specified B group vitamins: Secondary | ICD-10-CM

## 2022-08-20 NOTE — Assessment & Plan Note (Signed)
Following with dermatology.  Continue Opzelura 1.5% topically PRN.

## 2022-08-20 NOTE — Assessment & Plan Note (Signed)
Lower today, however, she is asymptomatic and with better readings during prior visits.  Remain off treatment. Continue to monitor.

## 2022-08-20 NOTE — Assessment & Plan Note (Signed)
Following with neurology, reviewed office notes and MRI brain from April 2023.  Currently not on treatment. Continue methocarbamol 500 mg 1-2 times daily.

## 2022-08-20 NOTE — Assessment & Plan Note (Signed)
Repeat vitamin B12 level pending.  Continue B12 1000 mcg once every 1-2 months.

## 2022-08-20 NOTE — Patient Instructions (Signed)
You will be contacted regarding your referral to GI for the colonoscopy and for the diabetes nutritionist.  Please let us know if you have not been contacted within two weeks.   [facility] to schedule your mammogram.   Stop by the lab prior to leaving today. I will notify you of your results once received.   Please schedule a follow up visit for 6 months for a diabetes check.  It was a pleasure to see you today!  Preventive Care 76-27 Years Old, Female Preventive care refers to lifestyle choices and visits with your health care provider that can promote health and wellness. Preventive care visits are also called wellness exams. What can I expect for my preventive care visit? Counseling Your health care provider may ask you questions about your: Medical history, including: Past medical problems. Family medical history. Pregnancy history. Current health, including: Menstrual cycle. Method of birth control. Emotional well-being. Home life and relationship well-being. Sexual activity and sexual health. Lifestyle, including: Alcohol, nicotine or tobacco, and drug use. Access to firearms. Diet, exercise, and sleep habits. Work and work Statistician. Sunscreen use. Safety issues such as seatbelt and bike helmet use. Physical exam Your health care provider will check your: Height and weight. These may be used to calculate your BMI (body mass index). BMI is a measurement that tells if you are at a healthy weight. Waist circumference. This measures the distance around your waistline. This measurement also tells if you are at a healthy weight and may help predict your risk of certain diseases, such as type 2 diabetes and high blood pressure. Heart rate and blood pressure. Body temperature. Skin for abnormal spots. What immunizations do I need?  Vaccines are usually given at various ages, according to a schedule. Your health care provider will recommend vaccines for you based  on your age, medical history, and lifestyle or other factors, such as travel or where you work. What tests do I need? Screening Your health care provider may recommend screening tests for certain conditions. This may include: Lipid and cholesterol levels. Diabetes screening. This is done by checking your blood sugar (glucose) after you have not eaten for a while (fasting). Pelvic exam and Pap test. Hepatitis B test. Hepatitis C test. HIV (human immunodeficiency virus) test. STI (sexually transmitted infection) testing, if you are at risk. Lung cancer screening. Colorectal cancer screening. Mammogram. Talk with your health care provider about when you should start having regular mammograms. This may depend on whether you have a family history of breast cancer. BRCA-related cancer screening. This may be done if you have a family history of breast, ovarian, tubal, or peritoneal cancers. Bone density scan. This is done to screen for osteoporosis. Talk with your health care provider about your test results, treatment options, and if necessary, the need for more tests. Follow these instructions at home: Eating and drinking  Eat a diet that includes fresh fruits and vegetables, whole grains, lean protein, and low-fat dairy products. Take vitamin and mineral supplements as recommended by your health care provider. Do not drink alcohol if: Your health care provider tells you not to drink. You are pregnant, may be pregnant, or are planning to become pregnant. If you drink alcohol: Limit how much you have to 0-1 drink a day. Know how much alcohol is in your drink. In the [place] one drink equals one 12 oz bottle of beer (355 mL), one 5 oz glass of wine (148 mL), or one 1 oz glass of  hard liquor (44 mL). Lifestyle Brush your teeth every morning and night with fluoride toothpaste. Floss one time each day. Exercise for at least 30 minutes 5 or more days each week. Do not use any products that contain  nicotine or tobacco. These products include cigarettes, chewing tobacco, and vaping devices, such as e-cigarettes. If you need help quitting, ask your health care provider. Do not use drugs. If you are sexually active, practice safe sex. Use a condom or other form of protection to prevent STIs. If you do not wish to become pregnant, use a form of birth control. If you plan to become pregnant, see your health care provider for a prepregnancy visit. Take aspirin only as told by your health care provider. Make sure that you understand how much to take and what form to take. Work with your health care provider to find out whether it is safe and beneficial for you to take aspirin daily. Find healthy ways to manage stress, such as: Meditation, yoga, or listening to music. Journaling. Talking to a trusted person. Spending time with friends and family. Minimize exposure to UV radiation to reduce your risk of skin cancer. Safety Always wear your seat belt while driving or riding in a vehicle. Do not drive: If you have been drinking alcohol. Do not ride with someone who has been drinking. When you are tired or distracted. While texting. If you have been using any mind-altering substances or drugs. Wear a helmet and other protective equipment during sports activities. If you have firearms in your house, make sure you follow all gun safety procedures. Seek help if you have been physically or sexually abused. What's next? Visit your health care provider once a year for an annual wellness visit. Ask your health care provider how often you should have your eyes and teeth checked. Stay up to date on all vaccines. This information is not intended to replace advice given to you by your health care provider. Make sure you discuss any questions you have with your health care provider. Document Revised: 04/30/2021 Document Reviewed: 04/30/2021 Elsevier Patient Education  [place].

## 2022-08-20 NOTE — Assessment & Plan Note (Signed)
Continue nightly CPAP. Following with pulmonology.

## 2022-08-20 NOTE — Assessment & Plan Note (Signed)
Overall controlled, some breakthrough symptoms since starting Ozempic.  She prefers to stay on Ozempic. Continue pantoprazole 40 mg BID.

## 2022-08-20 NOTE — Assessment & Plan Note (Signed)
Immunizations UTD.  Pap smear UTD. Mammogram overdue, she agrees to complete. Orders placed. Colonoscopy overdue, she has declined for several years. Agrees today. Referral placed.   Discussed the importance of a healthy diet and regular exercise in order for weight loss, and to reduce the risk of further co-morbidity.  Exam stable. Labs pending.  Follow up in 1 year for repeat physical.

## 2022-08-20 NOTE — Assessment & Plan Note (Signed)
Improving.  Continue tamsulosin 0.4 mg daily Following with Urology

## 2022-08-20 NOTE — Assessment & Plan Note (Signed)
Repeat A1C pending.  Continue Ozempic 1 mg weekly. Urine microalbumin due and pending.  Foot exam UTD. Pneumonia vaccine UTD.  Follow up in 3-6 months.

## 2022-08-20 NOTE — Assessment & Plan Note (Signed)
Following with psychiatry.  Office notes reviewed from September 2023.  Continue Restoril 60 mg HS, Trazodone 100 mg HS.

## 2022-08-20 NOTE — Progress Notes (Signed)
Subjective:    Patient ID: Emily Moon, female    DOB: 06/06/1961, 61 y.o.   MRN: [id number]  HPI  Emily Moon is a very pleasant 61 y.o. female who presents today for complete physical and follow up of chronic conditions.  Immunizations: -Influenza: Completed this season  -Shingles: Never completed, declines  -Pneumonia: Prevnar 13 in 2015, Pneumovax 23 in 2016, Prevnar 20 at [facility] recently.  Diet: Fair diet.  Exercise: No regular exercise.  Eye exam: Completes annually  Dental exam: Completes semi-annually   Pap Smear: Completed in 2022 Mammogram: Completed years ago, declines   Colonoscopy: Never completed, declined last year   BP Readings from Last 3 Encounters:  08/20/22 98/60  08/10/22 136/80  06/01/22 110/70       Review of Systems  Constitutional:  Negative for unexpected weight change.  HENT:  Negative for rhinorrhea.   Respiratory:  Negative for cough and shortness of breath.   Cardiovascular:  Negative for chest pain.  Gastrointestinal:  Negative for constipation and diarrhea.  Genitourinary:  Negative for difficulty urinating.  Musculoskeletal:  Positive for arthralgias.  Skin:  Negative for rash.  Allergic/Immunologic: Negative for environmental allergies.  Neurological:  Positive for dizziness, weakness and numbness. Negative for headaches.         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   History of kidney stones     Hypercholesterolemia    IDA (iron deficiency anemia)    Multiple sclerosis (HCC)    weakness   Osteoporosis    osteoarthritis   Pneumonia    PONV (postoperative nausea and vomiting)    no problem after cataract surgery   Prediabetes 07/22/2018   Psoriasis    Shortness of breath dyspnea    wheezing   Sleep apnea 2012   sleep study / slight, no interventions   Urinary frequency    Weight gain 06/21/2014    Social History   Socioeconomic History   Marital status: Divorced    Spouse name: Not on file   Number of children: 1   Years of education: Not on file   Highest education level: Not on file  Occupational History   Occupation: Therapist, 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  Tobacco Use   Smoking status: Former    Packs/day: 1.00    Years: 25.00    Total pack years: 25.00    Types: Cigarettes    Quit date: 2019    Years since quitting: 4.7    Passive exposure: Past   Smokeless tobacco: Never   Tobacco comments:    occasional use  Vaping Use   Vaping Use: Former  Substance and Sexual Activity   Alcohol use: No    Alcohol/week: 0.0 standard drinks of alcohol   Drug use: Yes    Types: Methylphenidate, Marijuana  Comment: Delta 8 - once per day   Sexual activity: Never  Other Topics Concern   Not on file  Social History Narrative   Not on file   Social Determinants of Health   Financial Resource Strain: Low Risk  (06/03/2021)   Overall Financial Resource Strain (CARDIA)    Difficulty of Paying Living Expenses: Not hard at all  Food Insecurity: No Food Insecurity (06/03/2021)   Hunger Vital Sign    Worried About Running Out of Food in the Last Year: Never true    Ran Out of Food in the Last Year: Never true  Transportation Needs: No Transportation Needs (06/03/2021)   PRAPARE - Hydrologist (Medical): No    Lack of Transportation (Non-Medical): No  Physical Activity: Inactive (06/03/2021)   Exercise Vital Sign     Days of Exercise per Week: 0 days    Minutes of Exercise per Session: 0 min  Stress: No Stress Concern Present (06/03/2021)   [place]    Feeling of Stress : Not at all  Social Connections: Not on file  Intimate Partner Violence: Not At Risk (06/03/2021)   Humiliation, Afraid, Rape, and Kick questionnaire    Fear of Current or Ex-Partner: No    Emotionally Abused: No    Physically Abused: No    Sexually Abused: No    Past Surgical History:  Procedure Laterality Date   CATARACT EXTRACTION W/PHACO Left 05/21/2015   Procedure: CATARACT EXTRACTION PHACO AND INTRAOCULAR LENS PLACEMENT ([place]);  Surgeon: Birder Robson, MD;  Location: [facility];  Service: Ophthalmology;  Laterality: Left;  [place] 00:35 AP% 22.9 CDE 8.11 fluid pack lot #[id number]   CATARACT EXTRACTION W/PHACO Right 06/04/2015   Procedure: CATARACT EXTRACTION PHACO AND INTRAOCULAR LENS PLACEMENT ([facility]);  Surgeon: Birder Robson, MD;  Location: [facility];  Service: Ophthalmology;  Laterality: Right;  US:00:48 AP%: 10.5 CDE:5.08 Fluid lot #[id number]   CYSTOSCOPY/URETEROSCOPY/HOLMIUM LASER/STENT PLACEMENT Bilateral 09/22/2016   Procedure: CYSTOSCOPY/URETEROSCOPY/HOLMIUM LASER/STENT PLACEMENT;  Surgeon: Hollice Espy, MD;  Location: [facility];  Service: Urology;  Laterality: Bilateral;   EYE SURGERY  2015   tissue biopsy   FOOT SURGERY  2015   JOINT REPLACEMENT Left 2013   hip replacement   LITHOTRIPSY     PTOSIS REPAIR Bilateral 02/18/2016   Procedure: BILATERAL PTOSIS REPAIR UPPER EYELIDS;  Surgeon: Karle Starch, MD;  Location: [facility];  Service: Ophthalmology;  Laterality: Bilateral;  LEAVE PT EARLY AM   thumb surgery Right    TONSILLECTOMY  1973   TOTAL HIP ARTHROPLASTY Right 07/02/2021   Procedure: TOTAL HIP ARTHROPLASTY ANTERIOR APPROACH;  Surgeon: Gaynelle Arabian, MD;  Location: [facility];  Service: Orthopedics;  Laterality: Right;    Family History   Problem Relation Age of Onset   Cancer Father        Abdomen with mastasis   Cancer Mother    Heart disease Mother    Kidney disease Neg Hx    Bladder Cancer Neg Hx    Prostate cancer Neg Hx    Kidney cancer Neg Hx     Allergies  Allergen Reactions   Albuterol Shortness Of Breath and Other (See Comments)    Makes pt feel jittery/ tacycardic   Crestor [Rosuvastatin] Other (See Comments)    Joint pain, muscle pain, and hair loss   Halcion [Triazolam] Other (See Comments)    Dizziness,headaches,bladder problems   Levaquin [Levofloxacin In D5w] Diarrhea and Itching  Shoulder pain   Metformin And Related Rash   Naproxen Sodium     Patient tolerates in small doses. Her reaction is swelling of the lower extremities.    Cefaclor Other (See Comments)    Doesn't remember---unsure if actually allergic    Sulfa Antibiotics Itching    Unsure of reaction possibly itching   Tramadol Itching and Nausea And Vomiting   Zinc Other (See Comments)    constipation  constipation    Aripiprazole Other (See Comments)    Muscle tension/cramping   Diclofenac Sodium Rash    "made very sick"    Current Outpatient Medications on File Prior to Visit  Medication Sig Dispense Refill   aspirin EC 81 MG tablet Take 81 mg by mouth at bedtime. Swallow whole.     Azelastine HCl 137 MCG/SPRAY SOLN PLACE 2 SPRAYS INTO BOTH NOSTRILS 2 (TWO) TIMES DAILY 30 mL 5   B Complex-Biotin-FA (BIG 100, BIOTIN, PO) Take 300 mg by mouth at bedtime.     cetirizine (ZYRTEC) 10 MG tablet Take 10 mg by mouth 2 (two) times daily.     clindamycin (CLEOCIN T) 1 % external solution Apply to scalp once or twice a day prn bumps 30 mL 2   Continuous Blood Gluc Sensor (FREESTYLE LIBRE 3 SENSOR) MISC Place 1 sensor on the skin every 14 days. Use to check glucose continuously 6 each 1   cyanocobalamin (VITAMIN B12) 1000 MCG/ML injection INJECT 1 ML (1,000 MCG TOTAL) INTO THE MUSCLE EVERY 30 (THIRTY) DAYS. FOR B12 VITAMIN 3 mL 0    desipramine (NORPRAMIN) 25 MG tablet 2  qam 60 tablet 6   diphenhydrAMINE (BENADRYL) 2 % cream Apply topically 3 (three) times daily as needed for itching.     DULoxetine HCl 40 MG CPEP Take 20 mg by mouth 2 (two) times daily. 2 q day 60 capsule 4   Evolocumab (REPATHA SURECLICK) 010 MG/ML SOAJ Inject 1 Dose into the skin every 14 (fourteen) days. 2 mL 5   fenofibrate (TRICOR) 145 MG tablet Take 1 tablet (145 mg total) by mouth daily. For cholesterol. 90 tablet 2   hydrOXYzine (VISTARIL) 50 MG capsule Take 2 capsules (100 mg total) by mouth at bedtime. TAKE 1 CAPSLE BY MOUTH AT NOON, 1 AT 6 PM, AND 1 AS NEEDED 90 capsule 3   ibuprofen (ADVIL) 200 MG tablet Take 400 mg by mouth 2 (two) times daily.     lamoTRIgine (LAMICTAL) 150 MG tablet Take 2 tablets (300 mg total) by mouth daily at bedtime. 60 tablet 1   Lysine 500 MG TABS Take 1,000 mg by mouth daily.     methocarbamol (ROBAXIN) 500 MG tablet TAKE 1-2 TABLETS (500-1,000 MG TOTAL) BY MOUTH AT BEDTIME AS NEEDED FOR MUSCLE SPASMS. 180 tablet 0   metoprolol tartrate (LOPRESSOR) 50 MG tablet TAKE 1 TABLET BY MOUTH TWICE A DAY 180 tablet 0   neomycin-bacitracin-polymyxin (NEOSPORIN) [phone] ointment Apply 1 application topically 4 (four) times daily as needed (for cut/scrapes.).     pantoprazole (PROTONIX) 40 MG tablet TAKE 1 TABLET (40 MG TOTAL) BY MOUTH 2 (TWO) TIMES DAILY BEFORE A MEAL. FOR HEARTBURN 180 tablet 3   polyvinyl alcohol (LIQUIFILM TEARS) 1.4 % ophthalmic solution Place 2 drops into both eyes at bedtime.     Ruxolitinib Phosphate (OPZELURA) 1.5 % CREA Apply topically.     Semaglutide, 1 MG/DOSE, 4 MG/3ML SOPN Inject 1 mg as directed once a week. for diabetes. 9 mL 0   STIOLTO RESPIMAT  2.5-2.5 MCG/ACT AERS Inhale 2 puffs into the lungs at bedtime.     tamsulosin (FLOMAX) 0.4 MG CAPS capsule Take 1 capsule (0.4 mg total) by mouth daily. 30 capsule 11   temazepam (RESTORIL) 30 MG capsule TAKE ONE TO TWO CAPSULES BY MOUTH NIGHTLY AT  BEDTIME 60 capsule 5   traZODone (DESYREL) 50 MG tablet Take 2 tablets (100 mg total) by mouth at bedtime. 60 tablet 7   trimethoprim (TRIMPEX) 100 MG tablet Take 1 tablet (100 mg total) by mouth daily. 30 tablet 11   valACYclovir (VALTREX) 1000 MG tablet Take 2 tablets by mouth twice daily for 1 day as needed for herpes outbreak. 12 tablet 0   Vitamin D3 (VITAMIN D) 25 MCG tablet Take 3,000 Units by mouth daily.     ferrous sulfate 325 (65 FE) MG tablet TAKE 1 TABLET BY MOUTH 2 TIMES DAILY WITH A MEAL. (Patient not taking: Reported on 08/20/2022) 180 tablet 0   vitamin C (ASCORBIC ACID) 500 MG tablet Take 500 mg by mouth 2 (two) times daily. (Patient not taking: Reported on 08/20/2022)     No current facility-administered medications on file prior to visit.    BP 98/60   Pulse 88   Temp (!) 97 F (36.1 C) (Temporal)   Ht '5\' 9"'$  (1.753 m)   Wt 243 lb (110.2 kg)   LMP 08/23/2014   SpO2 98%   BMI 35.88 kg/m  Objective:   Physical Exam HENT:     Right Ear: Tympanic membrane and ear canal normal.     Left Ear: Tympanic membrane and ear canal normal.     Nose: Nose normal.  Eyes:     Conjunctiva/sclera: Conjunctivae normal.     Pupils: Pupils are equal, round, and reactive to light.  Neck:     Thyroid: No thyromegaly.  Cardiovascular:     Rate and Rhythm: Normal rate and regular rhythm.     Heart sounds: No murmur heard. Pulmonary:     Effort: Pulmonary effort is normal.     Breath sounds: Normal breath sounds. No rales.  Abdominal:     General: Bowel sounds are normal.     Palpations: Abdomen is soft.     Tenderness: There is no abdominal tenderness.  Musculoskeletal:        General: Normal range of motion.     Cervical back: Neck supple.  Lymphadenopathy:     Cervical: No cervical adenopathy.  Skin:    General: Skin is warm and dry.     Findings: No rash.  Neurological:     Mental Status: She is alert and oriented to person, place, and time.     Cranial Nerves: No  cranial nerve deficit.     Deep Tendon Reflexes: Reflexes are normal and symmetric.  Psychiatric:        Mood and Affect: Mood normal.           Assessment & Plan:   Problem List Items Addressed This Visit       Cardiovascular and Mediastinum   Essential hypertension    Lower today, however, she is asymptomatic and with better readings during prior visits.  Remain off treatment. Continue to monitor.       Relevant Orders   Comprehensive metabolic panel     Respiratory   OSA (obstructive sleep apnea)    Continue nightly CPAP. Following with pulmonology.       COPD with asthma    Following with pulmonology.  Continue  Stiolto Respimat 2.5-2.5 mcg, 2 puffs HS. Continue albuterol inhaler PRN.        Digestive   GERD (Chronic)    Overall controlled, some breakthrough symptoms since starting Ozempic.  She prefers to stay on Ozempic. Continue pantoprazole 40 mg BID.        Endocrine   Type 2 diabetes mellitus with hyperglycemia (HCC)    Repeat A1C pending.  Continue Ozempic 1 mg weekly. Urine microalbumin due and pending.  Foot exam UTD. Pneumonia vaccine UTD.  Follow up in 3-6 months.      Relevant Orders   Microalbumin/Creatinine Ratio, Urine   Hemoglobin A1c   Referral to Nutrition and Diabetes Services     Nervous and Auditory   Multiple sclerosis, progressive relapsing (Chronic)    Following with neurology, reviewed office notes and MRI brain from April 2023.  Currently not on treatment. Continue methocarbamol 500 mg 1-2 times daily.         Musculoskeletal and Integument   Psoriasis    Following with dermatology.  Continue Opzelura 1.5% topically PRN.        Other   HLD (hyperlipidemia) (Chronic)    Following with cardiology. Continue Repatha every 14 days, fenofibrate 145 mg daily.  Repeat lipid panel pending.       Relevant Orders   Lipid panel   Bipolar disorder ([place]) (Chronic)    Overall controlled per patient.   Commended her on finding a new therapist. Continue twice monthly sessions.   Following with psychiatry, office notes reviewed from September 2023.  Continue Duloxetine 20 mg BID, Lamictal 300 mg daily, hydroxyzine 100 mg HS and 50 mg PRN, Restoril 60 mg daily, Trazodone 100 mg, desipramine 25 mg daily.      Insomnia    Following with psychiatry.  Office notes reviewed from September 2023.  Continue Restoril 60 mg HS, Trazodone 100 mg HS.        Difficulty urinating    Improving.  Continue tamsulosin 0.4 mg daily Following with Urology      B12 deficiency    Repeat vitamin B12 level pending.  Continue B12 1000 mcg once every 1-2 months.      Preventative health care - Primary    Immunizations UTD.  Pap smear UTD. Mammogram overdue, she agrees to complete. Orders placed. Colonoscopy overdue, she has declined for several years. Agrees today. Referral placed.   Discussed the importance of a healthy diet and regular exercise in order for weight loss, and to reduce the risk of further co-morbidity.  Exam stable. Labs pending.  Follow up in 1 year for repeat physical.        Environmental and seasonal allergies    Overall controlled.  Continue Zyrtec 10 mg daily PRN.       HSV-1 (herpes simplex virus 1) infection    Outbreaks occurring 3-4 times annually.  Continue Valtrex 2000 mg BID x 1 day PRN.      Iron deficiency anemia    Following with hematology, reviewed office visit from July 2023. No longer on oral iron.   Repeat iron studies pending.      Relevant Orders   CBC   IBC + Ferritin   History of recurrent UTIs    Following with Urology, multiple hospitalizations with sepsis.  Continue trimethoprim 100 mg daily for prevention.       Other Visit Diagnoses     Screening for colon cancer       Relevant Orders   Ambulatory referral  to Gastroenterology   Encounter for screening mammogram for malignant neoplasm of breast       Relevant Orders    MM 3D SCREEN BREAST BILATERAL          [name], NP

## 2022-08-20 NOTE — Assessment & Plan Note (Addendum)
Following with cardiology. Continue Repatha every 14 days, fenofibrate 145 mg daily.  Repeat lipid panel pending.

## 2022-08-20 NOTE — Assessment & Plan Note (Signed)
Following with Urology, multiple hospitalizations with sepsis.  Continue trimethoprim 100 mg daily for prevention.

## 2022-08-20 NOTE — Assessment & Plan Note (Signed)
Following with pulmonology.  Continue Stiolto Respimat 2.5-2.5 mcg, 2 puffs HS. Continue albuterol inhaler PRN.

## 2022-08-20 NOTE — Assessment & Plan Note (Signed)
Overall controlled.  Continue Zyrtec 10 mg daily PRN.

## 2022-08-20 NOTE — Assessment & Plan Note (Signed)
Following with hematology, reviewed office visit from July 2023. No longer on oral iron.   Repeat iron studies pending.

## 2022-08-20 NOTE — Assessment & Plan Note (Signed)
Outbreaks occurring 3-4 times annually.  Continue Valtrex 2000 mg BID x 1 day PRN.

## 2022-08-20 NOTE — Assessment & Plan Note (Addendum)
Overall controlled per patient.  Commended her on finding a new therapist. Continue twice monthly sessions.   Following with psychiatry, office notes reviewed from September 2023.  Continue Duloxetine 20 mg BID, Lamictal 300 mg daily, hydroxyzine 100 mg HS and 50 mg PRN, Restoril 60 mg daily, Trazodone 100 mg, desipramine 25 mg daily.

## 2022-08-21 LAB — LIPID PANEL
Cholesterol: 146 mg/dL (ref 0–200)
HDL: 40.5 mg/dL (ref >39.00)
LDL Cholesterol: 67 mg/dL (ref 0–99)
NonHDL: 105.86
Total CHOL/HDL Ratio: 4
Triglycerides: 196 mg/dL — ABNORMAL HIGH (ref 0.0–149.0)
VLDL: 39.2 mg/dL (ref 0.0–40.0)

## 2022-08-21 LAB — IBC + FERRITIN
Ferritin: 46.7 ng/mL (ref 10.0–291.0)
Iron: 70 ug/dL (ref 42–145)
Saturation Ratios: 15.3 % — ABNORMAL LOW (ref 20.0–50.0)
TIBC: 457.8 ug/dL — ABNORMAL HIGH (ref 250.0–450.0)
Transferrin: 327 mg/dL (ref 212.0–360.0)

## 2022-08-21 LAB — COMPREHENSIVE METABOLIC PANEL
ALT: 10 U/L (ref 0–35)
AST: 9 U/L (ref 0–37)
Albumin: 4.2 g/dL (ref 3.5–5.2)
Alkaline Phosphatase: 43 U/L (ref 39–117)
BUN: 16 mg/dL (ref 6–23)
CO2: 28 mEq/L (ref 19–32)
Calcium: 9 mg/dL (ref 8.4–10.5)
Chloride: 103 mEq/L (ref 96–112)
Creatinine, Ser: 1.1 mg/dL (ref 0.40–1.20)
GFR: 54.67 mL/min — ABNORMAL LOW (ref >60.00)
Glucose, Bld: 133 mg/dL — ABNORMAL HIGH (ref 70–99)
Potassium: 4.1 mEq/L (ref 3.5–5.1)
Sodium: 141 mEq/L (ref 135–145)
Total Bilirubin: 0.3 mg/dL (ref 0.2–1.2)
Total Protein: 6.4 g/dL (ref 6.0–8.3)

## 2022-08-21 LAB — CBC
HCT: 36.3 % (ref 36.0–46.0)
Hemoglobin: 12.3 g/dL (ref 12.0–15.0)
MCHC: 33.9 g/dL (ref 30.0–36.0)
MCV: 94.1 fl (ref 78.0–100.0)
Platelets: 414 10*3/uL — ABNORMAL HIGH (ref 150.0–400.0)
RBC: 3.86 Mil/uL — ABNORMAL LOW (ref 3.87–5.11)
RDW: 13.1 % (ref 11.5–15.5)
WBC: 5.8 10*3/uL (ref 4.0–10.5)

## 2022-08-21 LAB — MICROALBUMIN / CREATININE URINE RATIO: Microalb, Ur: <0.7 mg/dL (ref 0.0–1.9)

## 2022-08-21 LAB — HEMOGLOBIN A1C: Hgb A1c MFr Bld: 6.9 % — ABNORMAL HIGH (ref 4.6–6.5)

## 2022-08-24 ENCOUNTER — Encounter: Payer: Self-pay | Admitting: Podiatry

## 2022-08-24 ENCOUNTER — Ambulatory Visit: Payer: PPO | Admitting: Podiatry

## 2022-08-24 DIAGNOSIS — E119 Type 2 diabetes mellitus without complications: Secondary | ICD-10-CM | POA: Diagnosis not present

## 2022-08-24 NOTE — Progress Notes (Signed)
She  Presents today for 48-monthdiabetic follow-up last A1c was 6.7.  States that she really has had no changes.  Objective: Vital signs are stable alert oriented x3.  Pulses are palpable.  Left lower extremity somewhat weaker than that of the right.  Neurologic sensorium is diminished per Semmes Weinstein monofilament more on the right than on the left.  No open lesions or wounds.  Assessment: Diabetes mellitus with neuropathy.  Plan: Follow-up with me again in 6 weeks

## 2022-08-25 ENCOUNTER — Other Ambulatory Visit (HOSPITAL_COMMUNITY): Payer: Self-pay | Admitting: Psychiatry

## 2022-08-25 DIAGNOSIS — F310 Bipolar disorder, current episode hypomanic: Secondary | ICD-10-CM

## 2022-08-27 ENCOUNTER — Ambulatory Visit: Payer: PPO | Admitting: Dermatology

## 2022-08-27 DIAGNOSIS — Z79899 Other long term (current) drug therapy: Secondary | ICD-10-CM | POA: Diagnosis not present

## 2022-08-27 DIAGNOSIS — L509 Urticaria, unspecified: Secondary | ICD-10-CM

## 2022-08-27 DIAGNOSIS — L42 Pityriasis rosea: Secondary | ICD-10-CM

## 2022-08-27 DIAGNOSIS — T300 Burn of unspecified body region, unspecified degree: Secondary | ICD-10-CM

## 2022-08-27 DIAGNOSIS — R238 Other skin changes: Secondary | ICD-10-CM | POA: Diagnosis not present

## 2022-08-27 DIAGNOSIS — T23001A Burn of unspecified degree of right hand, unspecified site, initial encounter: Secondary | ICD-10-CM

## 2022-08-27 DIAGNOSIS — B360 Pityriasis versicolor: Secondary | ICD-10-CM | POA: Diagnosis not present

## 2022-08-27 MED ORDER — KETOCONAZOLE 2 % EX SHAM: 1.0000 | MEDICATED_SHAMPOO | CUTANEOUS | 6 refills | Status: AC

## 2022-08-27 MED ORDER — MUPIROCIN 2 % EX OINT: 1.0000 | TOPICAL_OINTMENT | Freq: Every day | CUTANEOUS | 1 refills | Status: DC
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
## 2022-08-27 NOTE — Patient Instructions (Signed)
Due to recent changes in healthcare laws, you may see results of your pathology and/or laboratory studies on MyChart before the doctors have had a chance to review them. We understand that in some cases there may be results that are confusing or concerning to you. Please understand that not all results are received at the same time and often the doctors may need to interpret multiple results in order to provide you with the best plan of care or course of treatment. Therefore, we ask that you please give us 2 business days to thoroughly review all your results before contacting the office for clarification. Should we see a critical lab result, you will be contacted sooner.   If You Need Anything After Your Visit  If you have any questions or concerns for your doctor, please call our main line at [phone] and press option 4 to reach your doctor's medical assistant. If no one answers, please leave a voicemail as directed and we will return your call as soon as possible. Messages left after 4 pm will be answered the following business day.   You may also send us a message via MyChart. We typically respond to MyChart messages within 1-2 business days.  For prescription refills, please ask your pharmacy to contact our office. Our fax number is [phone].  If you have an urgent issue when the clinic is closed that cannot wait until the next business day, you can page your doctor at the number below.    Please note that while we do our best to be available for urgent issues outside of office hours, we are not available 24/7.   If you have an urgent issue and are unable to reach us, you may choose to seek medical care at your doctor's office, retail clinic, urgent care center, or emergency room.  If you have a medical emergency, please immediately call 911 or go to the emergency department.  Pager Numbers  - Dr. Kowalski: [phone]  - Dr. Moye: [phone]  - Dr. Stewart:  [phone]  In the event of inclement weather, please call our main line at [phone] for an update on the status of any delays or closures.  Dermatology Medication Tips: Please keep the boxes that topical medications come in in order to help keep track of the instructions about where and how to use these. Pharmacies typically print the medication instructions only on the boxes and not directly on the medication tubes.   If your medication is too expensive, please contact our office at [phone] option 4 or send us a message through MyChart.   We are unable to tell what your co-pay for medications will be in advance as this is different depending on your insurance coverage. However, we may be able to find a substitute medication at lower cost or fill out paperwork to get insurance to cover a needed medication.   If a prior authorization is required to get your medication covered by your insurance company, please allow us 1-2 business days to complete this process.  Drug prices often vary depending on where the prescription is filled and some pharmacies may offer cheaper prices.  The website www.goodrx.com contains coupons for medications through different pharmacies. The prices here do not account for what the cost may be with help from insurance (it may be cheaper with your insurance), but the website can give you the price if you did not use any insurance.  - You can print the associated coupon and take it with   your prescription to the pharmacy.  - You may also stop by our office during regular business hours and pick up a GoodRx coupon card.  - If you need your prescription sent electronically to a different pharmacy, notify our office through [facility] MyChart or by phone at [phone] option 4.     Si Usted Necesita Algo Despus de Su Visita  Tambin puede enviarnos un mensaje a travs de MyChart. Por lo general respondemos a los mensajes de MyChart en el transcurso de 1 a 2  das hbiles.  Para renovar recetas, por favor pida a su farmacia que se ponga en contacto con nuestra oficina. Nuestro nmero de fax es el [phone].  Si tiene un asunto urgente cuando la clnica est cerrada y que no puede esperar hasta el siguiente da hbil, puede llamar/localizar a su doctor(a) al nmero que aparece a continuacin.   Por favor, tenga en cuenta que aunque hacemos todo lo posible para estar disponibles para asuntos urgentes fuera del horario de oficina, no estamos disponibles las 24 horas del da, los 7 das de la semana.   Si tiene un problema urgente y no puede comunicarse con nosotros, puede optar por buscar atencin mdica  en el consultorio de su doctor(a), en una clnica privada, en un centro de atencin urgente o en una sala de emergencias.  Si tiene una emergencia mdica, por favor llame inmediatamente al 911 o vaya a la sala de emergencias.  Nmeros de bper  - Dr. Kowalski: [phone]  - Dra. Moye: [phone]  - Dra. Stewart: [phone]  En caso de inclemencias del tiempo, por favor llame a nuestra lnea principal al [phone] para una actualizacin sobre el estado de cualquier retraso o cierre.  Consejos para la medicacin en dermatologa: Por favor, guarde las cajas en las que vienen los medicamentos de uso tpico para ayudarle a seguir las instrucciones sobre dnde y cmo usarlos. Las farmacias generalmente imprimen las instrucciones del medicamento slo en las cajas y no directamente en los tubos del medicamento.   Si su medicamento es muy caro, por favor, pngase en contacto con nuestra oficina llamando al [phone] y presione la opcin 4 o envenos un mensaje a travs de MyChart.   No podemos decirle cul ser su copago por los medicamentos por adelantado ya que esto es diferente dependiendo de la cobertura de su seguro. Sin embargo, es posible que podamos encontrar un medicamento sustituto a menor costo o llenar un formulario para que el  seguro cubra el medicamento que se considera necesario.   Si se requiere una autorizacin previa para que su compaa de seguros cubra su medicamento, por favor permtanos de 1 a 2 das hbiles para completar este proceso.  Los precios de los medicamentos varan con frecuencia dependiendo del lugar de dnde se surte la receta y alguna farmacias pueden ofrecer precios ms baratos.  El sitio web www.goodrx.com tiene cupones para medicamentos de diferentes farmacias. Los precios aqu no tienen en cuenta lo que podra costar con la ayuda del seguro (puede ser ms barato con su seguro), pero el sitio web puede darle el precio si no utiliz ningn seguro.  - Puede imprimir el cupn correspondiente y llevarlo con su receta a la farmacia.  - Tambin puede pasar por nuestra oficina durante el horario de atencin regular y recoger una tarjeta de cupones de GoodRx.  - Si necesita que su receta se enve electrnicamente a una farmacia diferente, informe a nuestra oficina a travs de MyChart de [place]   o por telfono llamando al [phone] y presione la opcin 4.  

## 2022-09-08 ENCOUNTER — Telehealth: Payer: Self-pay | Admitting: Primary Care

## 2022-09-08 ENCOUNTER — Other Ambulatory Visit: Payer: Self-pay

## 2022-09-08 ENCOUNTER — Other Ambulatory Visit: Payer: Self-pay | Admitting: Primary Care

## 2022-09-08 DIAGNOSIS — E1165 Type 2 diabetes mellitus with hyperglycemia: Secondary | ICD-10-CM

## 2022-09-08 NOTE — Telephone Encounter (Signed)
I have sent in 30 day refill and let her know you will address other questions as soon as you can

## 2022-09-08 NOTE — Telephone Encounter (Signed)
  Encourage patient to contact the pharmacy for refills or they can request refills through [facility]  Did the patient contact the pharmacy:  yes   LAST APPOINTMENT DATE: 08/20/22  NEXT APPOINTMENT DATE:  MEDICATION:Continuous Blood Gluc Sensor (FREESTYLE LIBRE 3 SENSOR) MISC  Is the patient out of medication? Emily Moon stated that the last one was on for 6 days and fell off  If not, how much is left?  Is this a 90 day supply:   [id number] [place]:  [phone] Fax:  [phone]     Let patient know to contact pharmacy at the end of the day to make sure medication is ready.  Please notify patient to allow 48-72 hours to process

## 2022-09-08 NOTE — Telephone Encounter (Signed)
Joellen sent in 30 day supply and sent patient MyChart message in response to her request. Please see Mychart message for further documentation.

## 2022-09-09 ENCOUNTER — Encounter: Payer: Self-pay | Admitting: Dermatology

## 2022-09-18 ENCOUNTER — Telehealth: Payer: Self-pay

## 2022-09-18 NOTE — Progress Notes (Addendum)
    Chronic Care Management Pharmacy Assistant   Name: SHANEE BATCH  MRN: [id number] DOB: June 28, 1961  Reason for Encounter: CCM (2024 Ozempic PAP Renewal)   PAP renewal forms for Ozempic with [facility] for 2024 have been completed and uploaded.   Charlene Brooke, CPP notified  Marijean Niemann, [place] Clinical Pharmacy Assistant [phone]

## 2022-09-18 NOTE — Progress Notes (Signed)
    Chronic Care Management Pharmacy Assistant   Name: MEIYA WISLER  MRN: [id number] DOB: 1961-06-22  Reason for Encounter: CCM (Stiolto 2024 PAP Renewal)   [facility] patient assistant forms have not changed for 2024 at this time. Same form can be used. We will just need patient to sign the complete form as she did las time. Form has been uploaded.  Charlene Brooke, CPP notified  Marijean Niemann, [place] Clinical Pharmacy Assistant [phone]

## 2022-09-24 ENCOUNTER — Telehealth: Payer: Self-pay | Admitting: Urology

## 2022-09-24 NOTE — Telephone Encounter (Signed)
Spoke with patient and advised Dr. Matilde Sprang does not do virtual visits. Pt will come in for visit.

## 2022-09-24 NOTE — Telephone Encounter (Signed)
Pt wants to know if she can change her appt on Monday to a virtual visit w/MacDiarmid.  She would also like to try estrogen cream.

## 2022-09-26 DIAGNOSIS — E1165 Type 2 diabetes mellitus with hyperglycemia: Secondary | ICD-10-CM

## 2022-09-28 ENCOUNTER — Other Ambulatory Visit: Payer: Self-pay

## 2022-09-28 ENCOUNTER — Encounter: Payer: Self-pay | Admitting: Urology

## 2022-09-28 ENCOUNTER — Ambulatory Visit: Payer: PPO | Admitting: Urology

## 2022-09-28 VITALS — BP 103/69 | HR 91 | Ht 69.0 in

## 2022-09-28 DIAGNOSIS — N3020 Other chronic cystitis without hematuria: Secondary | ICD-10-CM

## 2022-09-28 DIAGNOSIS — Z87440 Personal history of urinary (tract) infections: Secondary | ICD-10-CM

## 2022-09-28 DIAGNOSIS — M6289 Other specified disorders of muscle: Secondary | ICD-10-CM | POA: Diagnosis not present

## 2022-09-28 MED ORDER — FENOFIBRATE 145 MG PO TABS: 145.0000 mg | ORAL_TABLET | Freq: Every day | ORAL | 0 refills | Status: DC

## 2022-09-28 MED ORDER — ESTRADIOL 0.1 MG/GM VA CREA: TOPICAL_CREAM | VAGINAL | 11 refills | Status: DC

## 2022-09-28 NOTE — Progress Notes (Signed)
09/28/2022 1:19 PM   Emily Moon 03-25-1961 [id number]  Referring provider: [name], NP Emily Moon,  [place] [address]  Chief Complaint  Patient presents with   Follow-up   Urinary Frequency    HPI: I saw the patient in 2019 and she was cleared for microscopic hematuria.  In the last year she has been admitted twice with sepsis with fever of 102 burning and straining to urinate.  She has a distant history of a kidney stone.  Has not had bladder infections otherwise.  She also describes getting infections perhaps after her stent removed  At baseline she voids every 1 hour and cannot hold it for 2 hours.  Gets up once a night.  She is continent   Discharge summary noted pyelonephritis diabetes fibromyalgia and UTI symptoms with a white blood count of 14.1.  She had E. coli in the urine.  CT scan demonstrated decreased perfusion right kidney that was subtle in keeping of pyelonephritis and no stones or hydronephrosis     Because of 2 episodes of sepsis we talked about pathophysiology and prophylaxis.  Reassess in about 2 months with cystoscopy on daily trimethoprim 100 mg 30x11.  Call if culture positive   Last culture negative Patient still has frequency at times she feels she cannot urinate well.  She has multiple sclerosis.  Clinically not infected today   On pelvic examination no prolapse.  No stress incontinence   Cystoscopy: Normal   Patient has microscopic hematuria ongoing but has been cleared with today cystoscopy and CT scan Mar 23, 2022    Patient will get the sense that she needs to urinate and then cannot go.  She has had this for years multiple sclerosis for years.  She would like to try Flomax and may have tried it years ago.  See in 8 weeks on Flomax with a residual.  Stay on trimethoprim and call if culture positive she has been cleared for infections and blood in urine   Today Frequency stable.  Last culture negative Clean not infected  on trimethoprim Flomax really did not help her flow and she often has to strain. She wants: Local estrogen cream to help UTIs as well.  She has not had a hysterectomy.  She also has trouble voiding when she is constipated.    PMH: Past Medical History:  Diagnosis Date   Acute pyelonephritis 12/25/2021   Arthritis    osteo   Asthma    Bipolar disorder ([place]) 05/21/2014   Cataracts, bilateral    COPD (chronic obstructive pulmonary disease) (HCC)    DDD (degenerative disc disease), cervical    also back   Depression    Diabetes mellitus without complication (HCC)    Dizziness    Positional   Edema    feet/legs   Fibromyalgia syndrome    Fungal infection    Finger nails   GERD (gastroesophageal reflux disease)    Gout    Heart palpitations    Hip dysplasia, congenital 09/15/2013   History of kidney stones    Hypercholesterolemia    IDA (iron deficiency anemia)    Multiple sclerosis (HCC)    weakness   Osteoporosis    osteoarthritis   Pneumonia    PONV (postoperative nausea and vomiting)    no problem after cataract surgery   Prediabetes 07/22/2018   Psoriasis    Shortness of breath dyspnea    wheezing   Sleep apnea 2012   sleep  study / slight, no interventions   Urinary frequency    Weight gain 06/21/2014    Surgical History: Past Surgical History:  Procedure Laterality Date   CATARACT EXTRACTION W/PHACO Left 05/21/2015   Procedure: CATARACT EXTRACTION PHACO AND INTRAOCULAR LENS PLACEMENT (IOC);  Surgeon: Birder Robson, MD;  Location: [facility];  Service: Ophthalmology;  Laterality: Left;  [place] 00:35 AP% 22.9 CDE 8.11 fluid pack lot #[id number]   CATARACT EXTRACTION W/PHACO Right 06/04/2015   Procedure: CATARACT EXTRACTION PHACO AND INTRAOCULAR LENS PLACEMENT (IOC);  Surgeon: Birder Robson, MD;  Location: [facility];  Service: Ophthalmology;  Laterality: Right;  US:00:48 AP%: 10.5 CDE:5.08 Fluid lot #[id number]   CYSTOSCOPY/URETEROSCOPY/HOLMIUM LASER/STENT PLACEMENT  Bilateral 09/22/2016   Procedure: CYSTOSCOPY/URETEROSCOPY/HOLMIUM LASER/STENT PLACEMENT;  Surgeon: Hollice Espy, MD;  Location: [facility];  Service: Urology;  Laterality: Bilateral;   EYE SURGERY  2015   tissue biopsy   FOOT SURGERY  2015   JOINT REPLACEMENT Left 2013   hip replacement   LITHOTRIPSY     PTOSIS REPAIR Bilateral 02/18/2016   Procedure: BILATERAL PTOSIS REPAIR UPPER EYELIDS;  Surgeon: Karle Starch, MD;  Location: [facility];  Service: Ophthalmology;  Laterality: Bilateral;  LEAVE PT EARLY AM   thumb surgery Right    TONSILLECTOMY  1973   TOTAL HIP ARTHROPLASTY Right 07/02/2021   Procedure: TOTAL HIP ARTHROPLASTY ANTERIOR APPROACH;  Surgeon: Gaynelle Arabian, MD;  Location: [facility];  Service: Orthopedics;  Laterality: Right;    Home Medications:  Allergies as of 09/28/2022       Reactions   Albuterol Shortness Of Breath, Other (See Comments)   Makes pt feel jittery/ tacycardic   Crestor [rosuvastatin] Other (See Comments)   Joint pain, muscle pain, and hair loss   Halcion [triazolam] Other (See Comments)   Dizziness,headaches,bladder problems   Levaquin [levofloxacin In D5w] Diarrhea, Itching   Shoulder pain   Metformin And Related Rash   Naproxen Sodium    Patient tolerates in small doses. Her reaction is swelling of the lower extremities.    Cefaclor Other (See Comments)   Doesn't remember---unsure if actually allergic    Sulfa Antibiotics Itching   Unsure of reaction possibly itching   Tramadol Itching, Nausea And Vomiting   Zinc Other (See Comments)   constipation  constipation    Aripiprazole Other (See Comments)   Muscle tension/cramping   Diclofenac Sodium Rash   "made very sick"        Medication List        Accurate as of September 28, 2022  1:19 PM. If you have any questions, ask your nurse or doctor.          ascorbic acid 500 MG tablet Commonly known as: VITAMIN C Take 500 mg by mouth 2 (two) times daily.   aspirin EC 81 MG  tablet Take 81 mg by mouth at bedtime. Swallow whole.   Azelastine HCl 137 MCG/SPRAY Soln PLACE 2 SPRAYS INTO BOTH NOSTRILS 2 (TWO) TIMES DAILY   BIG 100 (BIOTIN) PO Take 300 mg by mouth at bedtime.   cetirizine 10 MG tablet Commonly known as: ZYRTEC Take 10 mg by mouth 2 (two) times daily.   clindamycin 1 % external solution Commonly known as: CLEOCIN T Apply to scalp once or twice a day prn bumps   cyanocobalamin 1000 MCG/ML injection Commonly known as: VITAMIN B12 INJECT 1 ML (1,000 MCG TOTAL) INTO THE MUSCLE EVERY 30 (THIRTY) DAYS. FOR B12 VITAMIN   desipramine 25 MG tablet Commonly known as:  NORPRAMIN 2  qam   diphenhydrAMINE 2 % cream Commonly known as: BENADRYL Apply topically 3 (three) times daily as needed for itching.   DULoxetine HCl 40 MG Cpep Take 20 mg by mouth 2 (two) times daily. 2 q day   fenofibrate 145 MG tablet Commonly known as: TRICOR Take 1 tablet (145 mg total) by mouth daily. For cholesterol.   ferrous sulfate 325 (65 FE) MG tablet TAKE 1 TABLET BY MOUTH 2 TIMES DAILY WITH A MEAL.   FreeStyle Libre 3 Sensor Misc PLACE 1 SENSOR ON THE SKIN EVERY 14 DAYS. USE TO CHECK GLUCOSE CONTINUOUSLY   hydrOXYzine 50 MG capsule Commonly known as: VISTARIL Take 2 capsules (100 mg total) by mouth at bedtime. TAKE 1 CAPSLE BY MOUTH AT NOON, 1 AT 6 PM, AND 1 AS NEEDED   ibuprofen 200 MG tablet Commonly known as: ADVIL Take 400 mg by mouth 2 (two) times daily.   ketoconazole 2 % shampoo Commonly known as: NIZORAL Apply 1 Application topically as directed. Wash from waist up 3 times a week for 2 months, then decrease to 1 time a month, let sit 5 minutes and rinse off   lamoTRIgine 150 MG tablet Commonly known as: LAMICTAL Take 2 tablets (300 mg total) by mouth daily at bedtime.   Lysine 500 MG Tabs Take 1,000 mg by mouth daily.   methocarbamol 500 MG tablet Commonly known as: ROBAXIN TAKE 1-2 TABLETS (500-1,000 MG TOTAL) BY MOUTH AT BEDTIME AS  NEEDED FOR MUSCLE SPASMS.   metoprolol tartrate 50 MG tablet Commonly known as: LOPRESSOR TAKE 1 TABLET BY MOUTH TWICE A DAY   mupirocin ointment 2 % Commonly known as: BACTROBAN Apply 1 Application topically daily. Qd to wound on finger   neomycin-bacitracin-polymyxin [phone] ointment Apply 1 application topically 4 (four) times daily as needed (for cut/scrapes.).   Opzelura 1.5 % Crea Generic drug: Ruxolitinib Phosphate Apply topically.   pantoprazole 40 MG tablet Commonly known as: PROTONIX TAKE 1 TABLET (40 MG TOTAL) BY MOUTH 2 (TWO) TIMES DAILY BEFORE A MEAL. FOR HEARTBURN   polyvinyl alcohol 1.4 % ophthalmic solution Commonly known as: LIQUIFILM TEARS Place 2 drops into both eyes at bedtime.   Repatha SureClick 270 MG/ML [place] Generic drug: Evolocumab Inject 1 Dose into the skin every 14 (fourteen) days.   Semaglutide (1 MG/DOSE) 4 MG/3ML Sopn Inject 1 mg as directed once a week. for diabetes.   Stiolto Respimat 2.5-2.5 MCG/ACT Aers Generic drug: Tiotropium Bromide-Olodaterol Inhale 2 puffs into the lungs at bedtime.   tamsulosin 0.4 MG Caps capsule Commonly known as: FLOMAX Take 1 capsule (0.4 mg total) by mouth daily.   temazepam 30 MG capsule Commonly known as: RESTORIL TAKE ONE TO TWO CAPSULES BY MOUTH NIGHTLY AT BEDTIME   traZODone 50 MG tablet Commonly known as: DESYREL Take 2 tablets (100 mg total) by mouth at bedtime.   trimethoprim 100 MG tablet Commonly known as: TRIMPEX Take 1 tablet (100 mg total) by mouth daily.   valACYclovir 1000 MG tablet Commonly known as: VALTREX Take 2 tablets by mouth twice daily for 1 day as needed for herpes outbreak.   vitamin D3 25 MCG tablet Commonly known as: CHOLECALCIFEROL Take 3,000 Units by mouth daily.        Allergies:  Allergies  Allergen Reactions   Albuterol Shortness Of Breath and Other (See Comments)    Makes pt feel jittery/ tacycardic   Crestor [Rosuvastatin] Other (See Comments)     Joint pain, muscle pain, and  hair loss   Halcion [Triazolam] Other (See Comments)    Dizziness,headaches,bladder problems   Levaquin [Levofloxacin In D5w] Diarrhea and Itching    Shoulder pain   Metformin And Related Rash   Naproxen Sodium     Patient tolerates in small doses. Her reaction is swelling of the lower extremities.    Cefaclor Other (See Comments)    Doesn't remember---unsure if actually allergic    Sulfa Antibiotics Itching    Unsure of reaction possibly itching   Tramadol Itching and Nausea And Vomiting   Zinc Other (See Comments)    constipation  constipation    Aripiprazole Other (See Comments)    Muscle tension/cramping   Diclofenac Sodium Rash    "made very sick"    Family History: Family History  Problem Relation Age of Onset   Cancer Father        Abdomen with mastasis   Cancer Mother    Heart disease Mother    Kidney disease Neg Hx    Bladder Cancer Neg Hx    Prostate cancer Neg Hx    Kidney cancer Neg Hx     Social History:  reports that she quit smoking about 4 years ago. Her smoking use included cigarettes. She has a 25.00 pack-year smoking history. She has been exposed to tobacco smoke. She has never used smokeless tobacco. She reports current drug use. Drugs: Methylphenidate and Marijuana. She reports that she does not drink alcohol.  ROS:                                        Physical Exam: BP 103/69   Pulse 91   Ht '5\' 9"'$  (1.753 m)   LMP 08/23/2014   BMI 35.88 kg/m   Constitutional:  Alert and oriented, No acute distress. HEENT: [place] AT, moist mucus membranes.  Trachea midline, no masses.  Laboratory Data: Lab Results  Component Value Date   WBC 5.8 08/20/2022   HGB 12.3 08/20/2022   HCT 36.3 08/20/2022   MCV 94.1 08/20/2022   PLT 414.0 (H) 08/20/2022    Lab Results  Component Value Date   CREATININE 1.10 08/20/2022    No results found for: "PSA"  No results found for: "TESTOSTERONE"  Lab Results   Component Value Date   HGBA1C 6.9 (H) 08/20/2022    Urinalysis    Component Value Date/Time   COLORURINE YELLOW (A) 03/23/2022 1148   APPEARANCEUR Clear 08/10/2022 1314   LABSPEC 1.016 03/23/2022 1148   PHURINE 5.0 03/23/2022 1148   GLUCOSEU Negative 08/10/2022 1314   HGBUR NEGATIVE 03/23/2022 1148   HGBUR moderate 11/13/2010 0945   BILIRUBINUR Negative 08/10/2022 1314   KETONESUR NEGATIVE 03/23/2022 1148   PROTEINUR Negative 08/10/2022 1314   PROTEINUR 100 (A) 03/23/2022 1148   UROBILINOGEN 0.2 05/14/2022 1140   UROBILINOGEN 0.2 11/13/2010 0945   NITRITE Negative 08/10/2022 1314   NITRITE NEGATIVE 03/23/2022 1148   LEUKOCYTESUR Negative 08/10/2022 1314   LEUKOCYTESUR LARGE (A) 03/23/2022 1148    Pertinent Imaging:   Assessment & Plan: She understands that estrogen does help UTIs but she is well controlled on trimethoprim.  She would like to try it.  Prescription sent.  We talked about thickening of the endometrium and that she should see a gynecologist at least once a year she is on long-term estrogen.  See physical therapy for flow symptoms and bowel dysfunction.  Stop Flomax.  Stay on trimethoprim.  Reassess 1 year.  I do not think urodynamics would change the management and I would not be offering InterStim currently for her symptoms  There are no diagnoses linked to this encounter.  No follow-ups on file.  Reece Packer, MD  [facility] [address] [place], [place] [address] [phone]

## 2022-10-01 ENCOUNTER — Ambulatory Visit (INDEPENDENT_AMBULATORY_CARE_PROVIDER_SITE_OTHER): Payer: PPO

## 2022-10-01 VITALS — Ht 69.0 in | Wt 243.0 lb

## 2022-10-01 DIAGNOSIS — Z0000 Encounter for general adult medical examination without abnormal findings: Secondary | ICD-10-CM | POA: Diagnosis not present

## 2022-10-01 NOTE — Progress Notes (Signed)
Virtual Visit via Telephone Note  I connected with  Emily Moon on 10/01/22 at  3:30 PM EST by telephone and verified that I am speaking with the correct person using two identifiers.  Location: Patient: home Provider: [place] Persons participating in the virtual visit: [place]   I discussed the limitations, risks, security and privacy concerns of performing an evaluation and management service by telephone and the availability of in person appointments. The patient expressed understanding and agreed to proceed.  Interactive audio and video telecommunications were attempted between this nurse and patient, however failed, due to patient having technical difficulties OR patient did not have access to video capability.  We continued and completed visit with audio only.  Some vital signs may be absent or patient reported.   Emily David, LPN  Subjective:   Emily Moon is a 61 y.o. female who presents for Medicare Annual (Subsequent) preventive examination.  Review of Systems     Cardiac Risk Factors include: diabetes mellitus     Objective:    There were no vitals filed for this visit. There is no height or weight on file to calculate BMI.     10/01/2022    3:26 PM 03/23/2022    1:13 PM 03/23/2022   11:47 AM 01/15/2022    2:58 PM 12/24/2021    2:49 PM 07/02/2021    3:22 PM 06/23/2021    2:07 PM  Advanced Directives  Does Patient Have a Medical Advance Directive? Yes  No No No No No  Type of Paramedic of [place];Living will        Does patient want to make changes to medical advance directive? No - Patient declined        Copy of [place] in Chart? Yes - validated most recent copy scanned in chart (See row information)        Would patient like information on creating a medical advance directive?  No - Patient declined  No - Patient declined No - Patient declined No - Patient declined      Current Medications (verified) Outpatient Encounter Medications as of 10/01/2022  Medication Sig   aspirin EC 81 MG tablet Take 81 mg by mouth at bedtime. Swallow whole.   Azelastine HCl 137 MCG/SPRAY SOLN PLACE 2 SPRAYS INTO BOTH NOSTRILS 2 (TWO) TIMES DAILY   B Complex-Biotin-FA (BIG 100, BIOTIN, PO) Take 300 mg by mouth at bedtime.   cetirizine (ZYRTEC) 10 MG tablet Take 10 mg by mouth 2 (two) times daily.   Continuous Blood Gluc Sensor (FREESTYLE LIBRE 3 SENSOR) MISC PLACE 1 SENSOR ON THE SKIN EVERY 14 DAYS. USE TO CHECK GLUCOSE CONTINUOUSLY   cyanocobalamin (VITAMIN B12) 1000 MCG/ML injection INJECT 1 ML (1,000 MCG TOTAL) INTO THE MUSCLE EVERY 30 (THIRTY) DAYS. FOR B12 VITAMIN   desipramine (NORPRAMIN) 25 MG tablet 2  qam   diphenhydrAMINE (BENADRYL) 2 % cream Apply topically 3 (three) times daily as needed for itching.   DULoxetine HCl 40 MG CPEP Take 20 mg by mouth 2 (two) times daily. 2 q day   estradiol (ESTRACE) 0.1 MG/GM vaginal cream Estrogen Cream Instruction Discard applicator Apply pea sized amount to tip of finger to urethra before bed. Wash hands well after application. Use Monday, Wednesday and Friday   Evolocumab (REPATHA SURECLICK) 382 MG/ML SOAJ Inject 1 Dose into the skin every 14 (fourteen) days.   fenofibrate (TRICOR) 145 MG tablet Take 1 tablet (145 mg  total) by mouth daily. For cholesterol. Please call [phone] for an appointment.   hydrOXYzine (VISTARIL) 50 MG capsule Take 2 capsules (100 mg total) by mouth at bedtime. TAKE 1 CAPSLE BY MOUTH AT NOON, 1 AT 6 PM, AND 1 AS NEEDED   ibuprofen (ADVIL) 200 MG tablet Take 400 mg by mouth 2 (two) times daily.   ketoconazole (NIZORAL) 2 % shampoo Apply 1 Application topically as directed. Wash from waist up 3 times a week for 2 months, then decrease to 1 time a month, let sit 5 minutes and rinse off   lamoTRIgine (LAMICTAL) 150 MG tablet Take 2 tablets (300 mg total) by mouth daily at bedtime.   levalbuterol (XOPENEX  HFA) 45 MCG/ACT inhaler Inhale 2 puffs into the lungs every 4 (four) hours as needed for wheezing.   Lysine 500 MG TABS Take 1,000 mg by mouth daily.   methocarbamol (ROBAXIN) 500 MG tablet TAKE 1-2 TABLETS (500-1,000 MG TOTAL) BY MOUTH AT BEDTIME AS NEEDED FOR MUSCLE SPASMS.   metoprolol tartrate (LOPRESSOR) 50 MG tablet TAKE 1 TABLET BY MOUTH TWICE A DAY   mupirocin ointment (BACTROBAN) 2 % Apply 1 Application topically daily. Qd to wound on finger   neomycin-bacitracin-polymyxin (NEOSPORIN) [phone] ointment Apply 1 application topically 4 (four) times daily as needed (for cut/scrapes.).   pantoprazole (PROTONIX) 40 MG tablet TAKE 1 TABLET (40 MG TOTAL) BY MOUTH 2 (TWO) TIMES DAILY BEFORE A MEAL. FOR HEARTBURN   polyvinyl alcohol (LIQUIFILM TEARS) 1.4 % ophthalmic solution Place 2 drops into both eyes at bedtime.   Ruxolitinib Phosphate (OPZELURA) 1.5 % CREA Apply topically.   Semaglutide, 1 MG/DOSE, 4 MG/3ML SOPN Inject 1 mg as directed once a week. for diabetes.   STIOLTO RESPIMAT 2.5-2.5 MCG/ACT AERS Inhale 2 puffs into the lungs at bedtime.   temazepam (RESTORIL) 30 MG capsule TAKE ONE TO TWO CAPSULES BY MOUTH NIGHTLY AT BEDTIME   traZODone (DESYREL) 50 MG tablet Take 2 tablets (100 mg total) by mouth at bedtime.   valACYclovir (VALTREX) 1000 MG tablet Take 2 tablets by mouth twice daily for 1 day as needed for herpes outbreak.   Vitamin D3 (VITAMIN D) 25 MCG tablet Take 3,000 Units by mouth daily.   clindamycin (CLEOCIN T) 1 % external solution Apply to scalp once or twice a day prn bumps (Patient not taking: Reported on 10/01/2022)   ferrous sulfate 325 (65 FE) MG tablet TAKE 1 TABLET BY MOUTH 2 TIMES DAILY WITH A MEAL.   trimethoprim (TRIMPEX) 100 MG tablet Take 1 tablet (100 mg total) by mouth daily. (Patient not taking: Reported on 10/01/2022)   vitamin C (ASCORBIC ACID) 500 MG tablet Take 500 mg by mouth 2 (two) times daily.   No facility-administered encounter medications on file  as of 10/01/2022.    Allergies (verified) Albuterol, Crestor [rosuvastatin], Halcion [triazolam], Levaquin [levofloxacin in d5w], Metformin and related, Naproxen sodium, Cefaclor, Sulfa antibiotics, Tramadol, Zinc, Aripiprazole, and Diclofenac sodium   History: Past Medical History:  Diagnosis Date   Acute pyelonephritis 12/25/2021   Arthritis    osteo   Asthma    Bipolar disorder ([place]) 05/21/2014   Cataracts, bilateral    COPD (chronic obstructive pulmonary disease) (HCC)    DDD (degenerative disc disease), cervical    also back   Depression    Diabetes mellitus without complication (HCC)    Dizziness    Positional   Edema    feet/legs   Fibromyalgia syndrome    Fungal infection  Finger nails   GERD (gastroesophageal reflux disease)    Gout    Heart palpitations    Hip dysplasia, congenital 09/15/2013   History of kidney stones    Hypercholesterolemia    IDA (iron deficiency anemia)    Multiple sclerosis (HCC)    weakness   Osteoporosis    osteoarthritis   Pneumonia    PONV (postoperative nausea and vomiting)    no problem after cataract surgery   Prediabetes 07/22/2018   Psoriasis    Shortness of breath dyspnea    wheezing   Sleep apnea 2012   sleep study / slight, no interventions   Urinary frequency    Weight gain 06/21/2014   Past Surgical History:  Procedure Laterality Date   CATARACT EXTRACTION W/PHACO Left 05/21/2015   Procedure: CATARACT EXTRACTION PHACO AND INTRAOCULAR LENS PLACEMENT ([place]);  Surgeon: Birder Robson, MD;  Location: [facility];  Service: Ophthalmology;  Laterality: Left;  [place] 00:35 AP% 22.9 CDE 8.11 fluid pack lot #[id number]   CATARACT EXTRACTION W/PHACO Right 06/04/2015   Procedure: CATARACT EXTRACTION PHACO AND INTRAOCULAR LENS PLACEMENT (IOC);  Surgeon: Birder Robson, MD;  Location: [facility];  Service: Ophthalmology;  Laterality: Right;  US:00:48 AP%: 10.5 CDE:5.08 Fluid lot #[id number]   CYSTOSCOPY/URETEROSCOPY/HOLMIUM LASER/STENT  PLACEMENT Bilateral 09/22/2016   Procedure: CYSTOSCOPY/URETEROSCOPY/HOLMIUM LASER/STENT PLACEMENT;  Surgeon: Hollice Espy, MD;  Location: [facility];  Service: Urology;  Laterality: Bilateral;   EYE SURGERY  2015   tissue biopsy   FOOT SURGERY  2015   JOINT REPLACEMENT Left 2013   hip replacement   LITHOTRIPSY     PTOSIS REPAIR Bilateral 02/18/2016   Procedure: BILATERAL PTOSIS REPAIR UPPER EYELIDS;  Surgeon: Karle Starch, MD;  Location: [facility];  Service: Ophthalmology;  Laterality: Bilateral;  LEAVE PT EARLY AM   thumb surgery Right    TONSILLECTOMY  1973   TOTAL HIP ARTHROPLASTY Right 07/02/2021   Procedure: TOTAL HIP ARTHROPLASTY ANTERIOR APPROACH;  Surgeon: Gaynelle Arabian, MD;  Location: [facility];  Service: Orthopedics;  Laterality: Right;   Family History  Problem Relation Age of Onset   Cancer Father        Abdomen with mastasis   Cancer Mother    Heart disease Mother    Kidney disease Neg Hx    Bladder Cancer Neg Hx    Prostate cancer Neg Hx    Kidney cancer Neg Hx    Social History   Socioeconomic History   Marital status: Divorced    Spouse name: Not on file   Number of children: 1   Years of education: Not on file   Highest education level: Not on file  Occupational History   Occupation: Therapist, 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  Tobacco Use   Smoking status: Former    Packs/day: 1.00    Years: 25.00    Total pack years: 25.00    Types: Cigarettes    Quit date: 2019    Years since quitting: 4.8    Passive exposure: Past   Smokeless tobacco: Never   Tobacco comments:    occasional use  Vaping Use   Vaping Use: Former  Substance and Sexual Activity   Alcohol use: No    Alcohol/week: 0.0 standard drinks of alcohol   Drug use: Yes    Types: Methylphenidate, Marijuana    Comment: [place] 8 - once per day   Sexual activity: Never  Other Topics Concern   Not on file  Social History  Narrative   Not on file   Social Determinants of  Health   Financial Resource Strain: Low Risk  (10/01/2022)   Overall Financial Resource Strain (CARDIA)    Difficulty of Paying Living Expenses: Not very hard  Food Insecurity: No Food Insecurity (10/01/2022)   Hunger Vital Sign    Worried About Running Out of Food in the Last Year: Never true    Ran Out of Food in the Last Year: Never true  Transportation Needs: No Transportation Needs (10/01/2022)   PRAPARE - Hydrologist (Medical): No    Lack of Transportation (Non-Medical): No  Physical Activity: Insufficiently Active (10/01/2022)   Exercise Vital Sign    Days of Exercise per Week: 2 days    Minutes of Exercise per Session: 20 min  Stress: No Stress Concern Present (10/01/2022)   [place]    Feeling of Stress : Only a little  Social Connections: Socially Isolated (10/01/2022)   Social Connection and Isolation Panel [NHANES]    Frequency of Communication with Friends and Family: Three times a week    Frequency of Social Gatherings with Friends and Family: Three times a week    Attends Religious Services: Never    Active Member of Clubs or Organizations: No    Attends Music therapist: Never    Marital Status: Divorced    Tobacco Counseling Counseling given: Not Answered Tobacco comments: occasional use   Clinical Intake:  Pre-visit preparation completed: Yes  Pain : No/denies pain     Diabetes: Yes CBG done?: No Did pt. bring in CBG monitor from home?: No  How often do you need to have someone help you when you read instructions, pamphlets, or other written materials from your doctor or pharmacy?: 1 - Never  Diabetic?yes Nutrition Risk Assessment:  Has the patient had any N/V/D within the last 2 months?  Yes  Does the patient have any non-healing wounds?  No  Has the patient had any unintentional weight loss or weight gain?  Yes   Diabetes:  Is the  patient diabetic?  Yes  If diabetic, was a CBG obtained today?  No  Did the patient bring in their glucometer from home?  No  How often do you monitor your CBG's? continuous.   Financial Strains and Diabetes Management:  Are you having any financial strains with the device, your supplies or your medication? No .  Does the patient want to be seen by Chronic Care Management for management of their diabetes?  No  Would the patient like to be referred to a Nutritionist or for Diabetic Management?  No   Diabetic Exams:  Diabetic Eye Exam: Completed 01/28/22.  Pt has been advised about the importance in completing this exam.   Diabetic Foot Exam: Completed 01/14/22. Pt has been advised about the importance in completing this exam.   Interpreter Needed?: No  Information entered by :: Kirke Shaggy, LPN   Activities of Daily Living    10/01/2022    3:31 PM 10/01/2022    1:44 PM  In your present state of health, do you have any difficulty performing the following activities:  Hearing? 0 0  Vision? 0 0  Difficulty concentrating or making decisions? 1 1  Walking or climbing stairs? 1 1  Dressing or bathing? 0 0  Doing errands, shopping? 1 1  Preparing Food and eating ? Y Y  Using the Toilet? Tempie Donning  In the past six months, have you accidently leaked urine? N N  Do you have problems with loss of bowel control? N N  Managing your Medications? N N  Managing your Finances? Tempie Donning  Housekeeping or managing your Housekeeping? Tempie Donning    Patient Care Team: [name], NP as PCP - General (Internal Medicine) [name] Sable, MD as PCP - Cardiology (Cardiology) Birder Robson, MD as Referring Physician (Ophthalmology) [name], [facility] as Pharmacist (Pharmacist) Lloyd Huger, MD as Consulting Physician (Hematology and Oncology)  Indicate any recent Medical Services you may have received from other than [facility] providers in the past year (date may be approximate).      Assessment:   This is a routine wellness examination for [place].  Hearing/Vision screen Hearing Screening - Comments:: No aids Vision Screening - Comments:: Readers- [facility]  Dietary issues and exercise activities discussed: Current Exercise Habits: Home exercise routine, Type of exercise: walking, Time (Minutes): 20, Frequency (Times/Week): 2, Weekly Exercise (Minutes/Week): 40, Intensity: Mild   Goals Addressed             This Visit's Progress    DIET - EAT MORE FRUITS AND VEGETABLES         Depression Screen    10/01/2022    3:22 PM 08/20/2022    2:04 PM 04/01/2022    2:49 PM 06/03/2021    2:18 PM 04/23/2021    2:10 PM 04/05/2020    3:09 PM 03/06/2020    1:48 PM  PHQ 2/9 Scores  PHQ - 2 Score '2 6  6 6 6 6  '$ PHQ- 9 Score '5 18  15 15 6   '$ Exception Documentation   Patient refusal        Fall Risk    10/01/2022    3:29 PM 10/01/2022    1:44 PM 06/03/2021    2:08 PM 04/05/2020    3:08 PM 03/06/2020    1:47 PM  [place] in the past year? '1 1 1 1 1  '$ Number falls in past yr: '1 1 1 1 1  '$ Injury with Fall? '1 1 1 '$ 0   Risk for fall due to : History of fall(s)  Impaired balance/gait;Impaired mobility;Medication side effect History of fall(s);Impaired balance/gait   Follow up Falls prevention discussed;Falls evaluation completed  Falls evaluation completed;Falls prevention discussed Falls evaluation completed;Falls prevention discussed     FALL RISK PREVENTION PERTAINING TO THE HOME:  Any stairs in or around the home? No  If so, are there any without handrails? No  Home free of loose throw rugs in walkways, pet beds, electrical cords, etc? Yes  Adequate lighting in your home to reduce risk of falls? Yes   ASSISTIVE DEVICES UTILIZED TO PREVENT FALLS:  Life alert? No  Use of a cane, walker or w/c? Yes  Grab bars in the bathroom? Yes  Shower chair or bench in shower? Yes  Elevated toilet seat or a handicapped toilet? Yes    Cognitive Function:refused  2023     06/03/2021    2:10 PM 04/05/2020    3:12 PM  MMSE - Mini Mental State Exam  Not completed: Refused Refused        Immunizations Immunization History  Administered Date(s) Administered   Influenza Split 08/05/2011   Influenza, Seasonal, Injecte, Preservative Fre 06/26/2018   Influenza,inj,Quad PF,6+ Mos 08/17/2013, 07/13/2015, 07/06/2017, 06/26/2018, 06/26/2019, 07/04/2020, 09/03/2021   Influenza-Unspecified 07/08/2016, 06/18/2017, 06/26/2018, 08/06/2022   PFIZER(Purple Top)SARS-COV-2 Vaccination 02/06/2020, 02/28/2020,  07/01/2020, 03/19/2021   Pfizer Covid-19 Vaccine Bivalent Booster 35yr & up 09/03/2021   Pneumococcal Conjugate-13 07/05/2014   Pneumococcal Polysaccharide-23 07/13/2015    TDAP status: Due, Education has been provided regarding the importance of this vaccine. Advised may receive this vaccine at local pharmacy or Health Dept. Aware to provide a copy of the vaccination record if obtained from local pharmacy or Health Dept. Verbalized acceptance and understanding.  Flu Vaccine status: Due, Education has been provided regarding the importance of this vaccine. Advised may receive this vaccine at local pharmacy or Health Dept. Aware to provide a copy of the vaccination record if obtained from local pharmacy or Health Dept. Verbalized acceptance and understanding.  Pneumococcal vaccine status: Up to date  Covid-19 vaccine status: Completed vaccines  Qualifies for Shingles Vaccine? Yes   Zostavax completed No   Shingrix Completed?: No.    Education has been provided regarding the importance of this vaccine. Patient has been advised to call insurance company to determine out of pocket expense if they have not yet received this vaccine. Advised may also receive vaccine at local pharmacy or Health Dept. Verbalized acceptance and understanding.  Screening Tests Health Maintenance  Topic Date Due   COLONOSCOPY (Pts 45-445yrInsurance coverage will need to be  confirmed)  Never done   Lung Cancer Screening  04/08/2012   COVID-19 Vaccine (6 - Pfizer risk series) 10/29/2021   Zoster Vaccines- Shingrix (1 of 2) 11/20/2022 (Originally 04/08/1980)   MAMMOGRAM  08/21/2023 (Originally 05/16/2016)   TETANUS/TDAP  04/05/2024 (Originally 04/08/1980)   FOOT EXAM  01/15/2023   OPHTHALMOLOGY EXAM  01/29/2023   HEMOGLOBIN A1C  02/19/2023   Diabetic kidney evaluation - GFR measurement  08/21/2023   Diabetic kidney evaluation - Urine ACR  08/21/2023   Medicare Annual Wellness (AWV)  10/02/2023   PAP SMEAR-Modifier  06/04/2024   INFLUENZA VACCINE  Completed   Hepatitis C Screening  Completed   HIV Screening  Completed   HPV VACCINES  Aged Out    Health Maintenance  Health Maintenance Due  Topic Date Due   COLONOSCOPY (Pts [id number] coverage will need to be confirmed)  Never done   Lung Cancer Screening  04/08/2012   COVID-19 Vaccine (6 - Pfizer risk series) 10/29/2021    Declined referral for colonoscopy  Mammogram status: Ordered 08/20/22. Pt provided with contact info and advised to call to schedule appt.    Lung Cancer Screening: (Low Dose CT Chest recommended if Age 61-54-80ars, 30 pack-year currently smoking OR have quit w/in 15years.) does not qualify.    Additional Screening:  Hepatitis C Screening: does qualify; Completed 12/17/16  Vision Screening: Recommended annual ophthalmology exams for early detection of glaucoma and other disorders of the eye. Is the patient up to date with their annual eye exam?  Yes  Who is the provider or what is the name of the office in which the patient attends annual eye exams? [place] pt is not established with a provider, would they like to be referred to a provider to establish care? No .   Dental Screening: Recommended annual dental exams for proper oral hygiene  Community Resource Referral / Chronic Care Management: CRR required this visit?  No   CCM required this visit?  No       Plan:     I have personally reviewed and noted the following in the patient's chart:   Medical and social history Use of alcohol, tobacco or illicit drugs  Current medications  and supplements including opioid prescriptions. Patient is currently taking opioid prescriptions. Information provided to patient regarding non-opioid alternatives. Patient advised to discuss non-opioid treatment plan with their provider. Functional ability and status Nutritional status Physical activity Advanced directives List of other physicians Hospitalizations, surgeries, and ER visits in previous 12 months Vitals Screenings to include cognitive, depression, and falls Referrals and appointments  In addition, I have reviewed and discussed with patient certain preventive protocols, quality metrics, and best practice recommendations. A written personalized care plan for preventive services as well as general preventive health recommendations were provided to patient.     Emily David, LPN   [id number]   Nurse Notes: none

## 2022-10-01 NOTE — Patient Instructions (Signed)
Emily Moon , Thank you for taking time to come for your Medicare Wellness Visit. I appreciate your ongoing commitment to your health goals. Please review the following plan we discussed and let me know if I can assist you in the future.   Screening recommendations/referrals: Colonoscopy: declined referral Mammogram: ordered 08/20/22 Bone Density: declined referral Recommended yearly ophthalmology/optometry visit for glaucoma screening and checkup Recommended yearly dental visit for hygiene and checkup  Vaccinations: Influenza vaccine: 09/03/21 Pneumococcal vaccine: 07/13/15 Tdap vaccine: n/d Shingles vaccine: n/d   Covid-19:02/06/20, 02/28/20, 07/01/20, 03/19/21, 09/03/21  Advanced directives: yes  Conditions/risks identified: none  Next appointment: Follow up in one year for your annual wellness visit 10/04/23 @ 2:15 pm by phone   Preventive Care 60 Years and Older, Female Preventive care refers to lifestyle choices and visits with your health care provider that can promote health and wellness. What does preventive care include? A yearly physical exam. This is also called an annual well check. Dental exams once or twice a year. Routine eye exams. Ask your health care provider how often you should have your eyes checked. Personal lifestyle choices, including: Daily care of your teeth and gums. Regular physical activity. Eating a healthy diet. Avoiding tobacco and drug use. Limiting alcohol use. Practicing safe sex. Taking low-dose aspirin every day. Taking vitamin and mineral supplements as recommended by your health care provider. What happens during an annual well check? The services and screenings done by your health care provider during your annual well check will depend on your age, overall health, lifestyle risk factors, and family history of disease. Counseling  Your health care provider may ask you questions about your: Alcohol use. Tobacco use. Drug use. Emotional  well-being. Home and relationship well-being. Sexual activity. Eating habits. History of falls. Memory and ability to understand (cognition). Work and work Statistician. Reproductive health. Screening  You may have the following tests or measurements: Height, weight, and BMI. Blood pressure. Lipid and cholesterol levels. These may be checked every 5 years, or more frequently if you are over 43 years old. Skin check. Lung cancer screening. You may have this screening every year starting at age 71 if you have a 30-pack-year history of smoking and currently smoke or have quit within the past 15 years. Fecal occult blood test (FOBT) of the stool. You may have this test every year starting at age 29. Flexible sigmoidoscopy or colonoscopy. You may have a sigmoidoscopy every 5 years or a colonoscopy every 10 years starting at age 26. Hepatitis C blood test. Hepatitis B blood test. Sexually transmitted disease (STD) testing. Diabetes screening. This is done by checking your blood sugar (glucose) after you have not eaten for a while (fasting). You may have this done every 1-3 years. Bone density scan. This is done to screen for osteoporosis. You may have this done starting at age 14. Mammogram. This may be done every 1-2 years. Talk to your health care provider about how often you should have regular mammograms. Talk with your health care provider about your test results, treatment options, and if necessary, the need for more tests. Vaccines  Your health care provider may recommend certain vaccines, such as: Influenza vaccine. This is recommended every year. Tetanus, diphtheria, and acellular pertussis (Tdap, Td) vaccine. You may need a Td booster every 10 years. Zoster vaccine. You may need this after age 70. Pneumococcal 13-valent conjugate (PCV13) vaccine. One dose is recommended after age 73. Pneumococcal polysaccharide (PPSV23) vaccine. One dose is recommended after age 18.  Talk to your  health care provider about which screenings and vaccines you need and how often you need them. This information is not intended to replace advice given to you by your health care provider. Make sure you discuss any questions you have with your health care provider. Document Released: 11/29/2015 Document Revised: 07/22/2016 Document Reviewed: 09/03/2015 Elsevier Interactive Patient Education  2017 [employer] Prevention in the Home Falls can cause injuries. They can happen to people of all ages. There are many things you can do to make your home safe and to help prevent falls. What can I do on the outside of my home? Regularly fix the edges of walkways and driveways and fix any cracks. Remove anything that might make you trip as you walk through a door, such as a raised step or threshold. Trim any bushes or trees on the path to your home. Use bright outdoor lighting. Clear any walking paths of anything that might make someone trip, such as rocks or tools. Regularly check to see if handrails are loose or broken. Make sure that both sides of any steps have handrails. Any raised decks and porches should have guardrails on the edges. Have any leaves, snow, or ice cleared regularly. Use sand or salt on walking paths during winter. Clean up any spills in your garage right away. This includes oil or grease spills. What can I do in the bathroom? Use night lights. Install grab bars by the toilet and in the tub and shower. Do not use towel bars as grab bars. Use non-skid mats or decals in the tub or shower. If you need to sit down in the shower, use a plastic, non-slip stool. Keep the floor dry. Clean up any water that spills on the floor as soon as it happens. Remove soap buildup in the tub or shower regularly. Attach bath mats securely with double-sided non-slip rug tape. Do not have throw rugs and other things on the floor that can make you trip. What can I do in the bedroom? Use night  lights. Make sure that you have a light by your bed that is easy to reach. Do not use any sheets or blankets that are too big for your bed. They should not hang down onto the floor. Have a firm chair that has side arms. You can use this for support while you get dressed. Do not have throw rugs and other things on the floor that can make you trip. What can I do in the kitchen? Clean up any spills right away. Avoid walking on wet floors. Keep items that you use a lot in easy-to-reach places. If you need to reach something above you, use a strong step stool that has a grab bar. Keep electrical cords out of the way. Do not use floor polish or wax that makes floors slippery. If you must use wax, use non-skid floor wax. Do not have throw rugs and other things on the floor that can make you trip. What can I do with my stairs? Do not leave any items on the stairs. Make sure that there are handrails on both sides of the stairs and use them. Fix handrails that are broken or loose. Make sure that handrails are as long as the stairways. Check any carpeting to make sure that it is firmly attached to the stairs. Fix any carpet that is loose or worn. Avoid having throw rugs at the top or bottom of the stairs. If you do have throw  rugs, attach them to the floor with carpet tape. Make sure that you have a light switch at the top of the stairs and the bottom of the stairs. If you do not have them, ask someone to add them for you. What else can I do to help prevent falls? Wear shoes that: Do not have high heels. Have rubber bottoms. Are comfortable and fit you well. Are closed at the toe. Do not wear sandals. If you use a stepladder: Make sure that it is fully opened. Do not climb a closed stepladder. Make sure that both sides of the stepladder are locked into place. Ask someone to hold it for you, if possible. Clearly mark and make sure that you can see: Any grab bars or handrails. First and last  steps. Where the edge of each step is. Use tools that help you move around (mobility aids) if they are needed. These include: Canes. Walkers. Scooters. Crutches. Turn on the lights when you go into a dark area. Replace any light bulbs as soon as they burn out. Set up your furniture so you have a clear path. Avoid moving your furniture around. If any of your floors are uneven, fix them. If there are any pets around you, be aware of where they are. Review your medicines with your doctor. Some medicines can make you feel dizzy. This can increase your chance of falling. Ask your doctor what other things that you can do to help prevent falls. This information is not intended to replace advice given to you by your health care provider. Make sure you discuss any questions you have with your health care provider. Document Released: 08/29/2009 Document Revised: 04/09/2016 Document Reviewed: 12/07/2014 Elsevier Interactive Patient Education  2017 [employer].

## 2022-10-02 ENCOUNTER — Other Ambulatory Visit: Payer: Self-pay | Admitting: Primary Care

## 2022-10-02 DIAGNOSIS — E1165 Type 2 diabetes mellitus with hyperglycemia: Secondary | ICD-10-CM

## 2022-10-05 ENCOUNTER — Telehealth: Payer: Self-pay

## 2022-10-05 ENCOUNTER — Other Ambulatory Visit (HOSPITAL_COMMUNITY): Payer: Self-pay | Admitting: Psychiatry

## 2022-10-05 ENCOUNTER — Other Ambulatory Visit: Payer: Self-pay

## 2022-10-05 MED ORDER — REPATHA SURECLICK 140 MG/ML ~~LOC~~ SOAJ: 140.0000 mg | SUBCUTANEOUS | Status: DC

## 2022-10-05 NOTE — Progress Notes (Signed)
    Chronic Care Management Pharmacy Assistant   Name: ZHANIYA SWALLOWS  MRN: [id number] DOB: 01-Oct-1961  Reason for Encounter: CCM (Mammogram)   Called patient to offer Mammogram date at [facility] on 10/30/2022 as she is due for her Mammogram. No answer; voicemail full.  Charlene Brooke, CPP notified  Marijean Niemann, [place] Clinical Pharmacy Assistant [phone]

## 2022-10-12 NOTE — Telephone Encounter (Signed)
Forms printed and placed at front desk for patient signature.

## 2022-10-12 NOTE — Telephone Encounter (Signed)
Patient has been made aware the forms for [facility] are ready to be signed.  Charlene Brooke, CPP notified  Marijean Niemann, [place] Clinical Pharmacy Assistant [phone]

## 2022-10-26 ENCOUNTER — Telehealth: Payer: Self-pay | Admitting: Cardiology

## 2022-10-26 ENCOUNTER — Other Ambulatory Visit: Payer: Self-pay

## 2022-10-26 MED ORDER — REPATHA SURECLICK 140 MG/ML ~~LOC~~ SOAJ: 140.0000 mg | SUBCUTANEOUS | Status: DC

## 2022-10-26 NOTE — Telephone Encounter (Signed)
*  STAT* If patient is at the pharmacy, call can be transferred to refill team.   1. Which medications need to be refilled? (please list name of each medication and dose if known)    Evolocumab (REPATHA SURECLICK) 947 MG/ML SOAJ   2. Which pharmacy/location (including street and city if local pharmacy) is medication to be sent to?  [id number] [address] [place]  3. Do they need a 30 day or 90 day supply?  90 day  Patient stated she only has 1 syringe left.

## 2022-10-26 NOTE — Telephone Encounter (Signed)
Disp Refills Start End   Evolocumab (REPATHA SURECLICK) 183 MG/ML SOAJ 2 mL 01 10/26/2022    Sig - Route: Inject 140 mg into the skin every 14 (fourteen) days. PLEASE KEEP UPCOMING APPT TO ENSURE TIMELY REFILLS - Subcutaneous   Sent to pharmacy as: Evolocumab (Jack) 358 MG/ML Solution Auto-injector   E-Prescribing Status: Receipt confirmed by pharmacy (10/26/2022  4:12 PM EST)    Pharmacy  [id number] [place] [place]

## 2022-11-04 ENCOUNTER — Other Ambulatory Visit (HOSPITAL_COMMUNITY): Payer: Self-pay | Admitting: Psychiatry

## 2022-11-05 ENCOUNTER — Other Ambulatory Visit: Payer: Self-pay | Admitting: Primary Care

## 2022-11-05 DIAGNOSIS — E538 Deficiency of other specified B group vitamins: Secondary | ICD-10-CM

## 2022-11-10 ENCOUNTER — Ambulatory Visit: Payer: PPO | Admitting: Cardiology

## 2022-11-11 NOTE — Progress Notes (Deleted)
Cardiology Office Note    Date:  11/11/2022   ID:  Emily Moon, DOB Aug 08, 1961, MRN [id number]  PCP:  [name], NP  Cardiologist:  [name] Sable, MD  Electrophysiologist:  None   Chief Complaint: Follow-up  History of Present Illness:   Emily Moon is a 61 y.o. female with history of inappropriate sinus tachycardia, multiple sclerosis, DM2, HLD with statin intolerance on PCSK9i, bipolar, asthma, obstructive pulmonary disease, prior tobacco use x15 years, fibromyalgia, obesity, OSA on CPAP, DDD, congenital hip dysplasia, and GERD who presents for follow-up of sinus tachycardia.   Prior calcium score of 0 in 02/2020.  She was diagnosed with inappropriate sinus tachycardia in 05/2020 and started on metoprolol with improvement in symptoms.  She was evaluated in 04/2021 for preoperative cardiac risk ratification for planned upcoming total hip arthroplasty.  She was under increased stress and in the setting did note an increase in palpitations.  Given underlying shortness of breath she underwent echo on 06/05/2021 which showed an EF of 60 to 65%, no regional wall motion abnormalities, grade 1 diastolic dysfunction, normal RV systolic function and ventricular cavity size, no significant valvular abnormalities, and a mildly dilated left atrium.  Toprol was titrated to 75 mg daily in 07/2021.  However, in follow-up in the next month she reported intolerance to titrated Toprol secondary to constipation and elevated heart rates.  At that visit, Toprol was discontinued and she was transitioned to Lopressor 50 mg twice daily.  Subsequent Zio patch x 2 in 08/2021 showed: Monitor number 1) average heart rate 93 bpm with a range of 78 to 123 bpm with a predominant rhythm of sinus, rare atrial and ventricular ectopy.  Monitor number 2) predominant rhythm of sinus with an average rate of 90 bpm with a range of 70 to 207 bpm, 7 episodes of SVT with the fastest interval lasting 4 beats with a  maximum rate of 207 bpm and the longest interval lasting 5 beats.  SVT was detected with symptomatic patient events, rare atrial and ventricular ectopy were also identified.  In follow-up in 10/2021 she reported an improvement in her heart rates on Lopressor which was continued.  ***   Labs independently reviewed: 08/2022 - Hgb 12.3, PLT 414, potassium 4.1, BUN 16, serum creatinine 1.1, albumin 4.2, AST/ALT normal, A1c 6.9, TC 146, TG 196, HDL 40, LDL 67 02/2020 - TSH normal  Past Medical History:  Diagnosis Date   Acute pyelonephritis 12/25/2021   Arthritis    osteo   Asthma    Bipolar disorder ([place]) 05/21/2014   Cataracts, bilateral    COPD (chronic obstructive pulmonary disease) (HCC)    DDD (degenerative disc disease), cervical    also back   Depression    Diabetes mellitus without complication (HCC)    Dizziness    Positional   Edema    feet/legs   Fibromyalgia syndrome    Fungal infection    Finger nails   GERD (gastroesophageal reflux disease)    Gout    Heart palpitations    Hip dysplasia, congenital 09/15/2013   History of kidney stones    Hypercholesterolemia    IDA (iron deficiency anemia)    Multiple sclerosis (HCC)    weakness   Osteoporosis    osteoarthritis   Pneumonia    PONV (postoperative nausea and vomiting)    no problem after cataract surgery   Prediabetes 07/22/2018   Psoriasis    Shortness of breath dyspnea    wheezing  Sleep apnea 2012   sleep study / slight, no interventions   Urinary frequency    Weight gain 06/21/2014    Past Surgical History:  Procedure Laterality Date   CATARACT EXTRACTION W/PHACO Left 05/21/2015   Procedure: CATARACT EXTRACTION PHACO AND INTRAOCULAR LENS PLACEMENT ([place]);  Surgeon: Birder Robson, MD;  Location: [facility];  Service: Ophthalmology;  Laterality: Left;  [place] 00:35 AP% 22.9 CDE 8.11 fluid pack lot #[id number]   CATARACT EXTRACTION W/PHACO Right 06/04/2015   Procedure: CATARACT EXTRACTION PHACO AND  INTRAOCULAR LENS PLACEMENT ([facility]);  Surgeon: Birder Robson, MD;  Location: [facility];  Service: Ophthalmology;  Laterality: Right;  US:00:48 AP%: 10.5 CDE:5.08 Fluid lot #[id number]   CYSTOSCOPY/URETEROSCOPY/HOLMIUM LASER/STENT PLACEMENT Bilateral 09/22/2016   Procedure: CYSTOSCOPY/URETEROSCOPY/HOLMIUM LASER/STENT PLACEMENT;  Surgeon: Hollice Espy, MD;  Location: [facility];  Service: Urology;  Laterality: Bilateral;   EYE SURGERY  2015   tissue biopsy   FOOT SURGERY  2015   JOINT REPLACEMENT Left 2013   hip replacement   LITHOTRIPSY     PTOSIS REPAIR Bilateral 02/18/2016   Procedure: BILATERAL PTOSIS REPAIR UPPER EYELIDS;  Surgeon: Karle Starch, MD;  Location: [place];  Service: Ophthalmology;  Laterality: Bilateral;  LEAVE PT EARLY AM   thumb surgery Right    TONSILLECTOMY  1973   TOTAL HIP ARTHROPLASTY Right 07/02/2021   Procedure: TOTAL HIP ARTHROPLASTY ANTERIOR APPROACH;  Surgeon: Gaynelle Arabian, MD;  Location: [facility];  Service: Orthopedics;  Laterality: Right;    Current Medications: No outpatient medications have been marked as taking for the 11/13/22 encounter (Appointment) with Rise Mu, PA-C.    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  Tobacco Use   Smoking status: Former    Packs/day: 1.00    Years: 25.00    Total pack years: 25.00    Types: Cigarettes    Quit date: 2019    Years since quitting: 4.9    Passive exposure: Past   Smokeless tobacco: Never   Tobacco comments:    occasional use   Vaping Use   Vaping Use: Former  Substance and Sexual Activity   Alcohol use: No    Alcohol/week: 0.0 standard drinks of alcohol   Drug use: Yes    Types: Methylphenidate, Marijuana    Comment: Delta 8 - once per day   Sexual activity: Never  Other Topics Concern   Not on file  Social History Narrative   Not on file   Social Determinants of Health   Financial Resource Strain: Low Risk  (10/01/2022)   Overall Financial Resource Strain (CARDIA)    Difficulty of Paying Living Expenses: Not very hard  Food Insecurity: No Food Insecurity (10/01/2022)   Hunger Vital Sign    Worried About Running Out of Food in the Last Year: Never true    Ran Out of Food in the Last Year: Never true  Transportation Needs: No Transportation Needs (10/01/2022)   PRAPARE - Hydrologist (Medical): No    Lack of Transportation (Non-Medical):  No  Physical Activity: Insufficiently Active (10/01/2022)   Exercise Vital Sign    Days of Exercise per Week: 2 days    Minutes of Exercise per Session: 20 min  Stress: No Stress Concern Present (10/01/2022)   [place]    Feeling of Stress : Only a little  Social Connections: Socially Isolated (10/01/2022)   Social Connection and Isolation Panel [NHANES]    Frequency of Communication with Friends and Family: Three times a week    Frequency of Social Gatherings with Friends and Family: Three times a week    Attends Religious Services: Never    Active Member of Clubs or Organizations: No    Attends Archivist Meetings: Never    Marital Status: Divorced     Family History:  The patient's family history includes Cancer in her father and mother; Heart disease in her mother. There is no history of Kidney disease, Bladder Cancer, Prostate cancer, or Kidney cancer.  ROS:   ROS   EKGs/Labs/Other Studies Reviewed:    Studies reviewed were summarized above.  The additional studies were reviewed today:  Zio patch 08/2021: Monitor 1 Patient had a min HR of 78 bpm, max HR of 123 bpm, and avg HR of 93 bpm. Predominant underlying rhythm was Sinus Rhythm. Isolated SVEs were rare (<1.0%), and no SVE Couplets or SVE Triplets were present. Isolated VEs were rare (<1.0%), VE Couplets were  rare (<1.0%), and no VE Triplets were present.    Monitor 2 Patient had a min HR of 70 bpm, max HR of 207 bpm, and avg HR of 90 bpm. Predominant underlying rhythm was Sinus Rhythm. 7 Supraventricular Tachycardia runs occurred, the run with the fastest interval lasting 4 beats with a max rate of 207 bpm, the  longest lasting 5 beats with an avg rate of 120 bpm. Supraventricular Tachycardia was detected within +/- 45 seconds of symptomatic patient event(s). Isolated SVEs were rare (<1.0%), SVE Couplets were rare (<1.0%), and SVE Triplets were rare (<1.0%).  Isolated VEs were rare (<1.0%), VE Couplets were rare (<1.0%), and no VE Triplets were present. __________  2D echo 06/05/2021: 1. Left ventricular ejection fraction, by estimation, is 60 to 65%. The  left ventricle has normal function. The left ventricle has no regional  wall motion abnormalities. Left ventricular diastolic parameters are  consistent with Grade I diastolic  dysfunction (impaired relaxation).   2. Right ventricular systolic function is normal. The right ventricular  size is normal. Tricuspid regurgitation signal is inadequate for assessing  PA pressure.   3. Left atrial size was mildly dilated.  __________   Calcium score 03/11/2020: IMPRESSION: Coronary calcium score of 0.   EKG:  EKG is ordered today.  The EKG ordered today demonstrates ***  Recent Labs: 03/23/2022: B Natriuretic Peptide 99.7 08/20/2022: ALT 10; BUN 16; Creatinine, Ser 1.10; Hemoglobin 12.3; Platelets 414.0; Potassium 4.1; Sodium 141  Recent Lipid Panel    Component Value Date/Time   CHOL 146 08/20/2022 1508   TRIG 196.0 (H)  08/20/2022 1508   HDL 40.50 08/20/2022 1508   CHOLHDL 4 08/20/2022 1508   VLDL 39.2 08/20/2022 1508   LDLCALC 67 08/20/2022 1508   LDLDIRECT 53.0 08/01/2020 1254    PHYSICAL EXAM:    VS:  LMP 08/23/2014   BMI: There is no height or weight on file to calculate BMI.  Physical Exam  Wt Readings from Last 3 Encounters:  10/01/22 243 lb (110.2 kg)  08/20/22 243 lb (110.2 kg)  06/01/22 229 lb (103.9 kg)     ASSESSMENT & PLAN:   Inappropriate sinus tachycardia:  HLD: LDL 67 in 08/2022 with normal AST/ALT at that time.   {Are you ordering a CV Procedure (e.g. stress test, cath, DCCV, TEE, etc)?   Press F2        :[id number]}     Disposition: F/u with Dr. Garen Lah or an APP in ***.   Medication Adjustments/Labs and Tests Ordered: Current medicines are reviewed at length with the patient today.  Concerns regarding medicines are outlined above. Medication changes, Labs and Tests ordered today are summarized above and listed in the Patient Instructions accessible in Encounters.   Signed, Christell Faith, PA-C 11/11/2022 4:24 PM     [facility] [address] [place], [place] [address] [phone]

## 2022-11-12 DIAGNOSIS — G4733 Obstructive sleep apnea (adult) (pediatric): Secondary | ICD-10-CM | POA: Diagnosis not present

## 2022-11-13 ENCOUNTER — Ambulatory Visit: Payer: PPO | Admitting: Physician Assistant

## 2022-11-18 ENCOUNTER — Other Ambulatory Visit: Payer: Self-pay

## 2022-11-18 MED ORDER — FENOFIBRATE 145 MG PO TABS: 145.0000 mg | ORAL_TABLET | Freq: Every day | ORAL | 0 refills | Status: DC

## 2022-11-25 ENCOUNTER — Other Ambulatory Visit: Payer: Self-pay | Admitting: *Deleted

## 2022-11-25 DIAGNOSIS — D509 Iron deficiency anemia, unspecified: Secondary | ICD-10-CM

## 2022-11-25 MED FILL — Iron Sucrose Inj 20 MG/ML (Fe Equiv): INTRAVENOUS | Qty: 10 | Status: AC

## 2022-11-26 ENCOUNTER — Inpatient Hospital Stay: Payer: PPO | Admitting: Nurse Practitioner

## 2022-11-26 ENCOUNTER — Inpatient Hospital Stay: Payer: PPO

## 2022-11-29 ENCOUNTER — Other Ambulatory Visit: Payer: Self-pay | Admitting: Medical

## 2022-11-30 NOTE — Progress Notes (Unsigned)
Cardiology Office Note    Date:  12/01/2022   ID:  Emily Moon, DOB 08-21-1961, MRN [id number]  PCP:  Emily Koch, NP  Cardiologist:  [name] Sable, MD  Electrophysiologist:  None   Chief Complaint: Follow-up  History of Present Illness:   Emily Moon is a 62 y.o. female with history of inappropriate sinus tachycardia, multiple sclerosis, DM2, HLD with statin intolerance on PCSK9i, bipolar, asthma, obstructive pulmonary disease, prior tobacco use x15 years, fibromyalgia, obesity, OSA on CPAP, DDD, congenital hip dysplasia, and GERD who presents for follow-up of sinus tachycardia.   Prior calcium score of 0 in 02/2020.  She was diagnosed with inappropriate sinus tachycardia in 05/2020 and started on metoprolol with improvement in symptoms.  She was evaluated in 04/2021 for preoperative cardiac risk ratification for planned upcoming total hip arthroplasty.  She was under increased stress and in the setting did note an increase in palpitations.  Given underlying shortness of breath she underwent echo on 06/05/2021 which showed an EF of 60 to 65%, no regional wall motion abnormalities, grade 1 diastolic dysfunction, normal RV systolic function and ventricular cavity size, no significant valvular abnormalities, and a mildly dilated left atrium.  Toprol was titrated to 75 mg daily in 07/2021.  However, in follow-up in the next month she reported intolerance to titrated Toprol secondary to constipation and elevated heart rates.  At that visit, Toprol was discontinued and she was transitioned to Lopressor 50 mg twice daily.  Subsequent Zio patch x 2 in 08/2021 showed: Monitor number 1) average heart rate 93 bpm with a range of 78 to 123 bpm with a predominant rhythm of sinus, rare atrial and ventricular ectopy.  Monitor number 2) predominant rhythm of sinus with an average rate of 90 bpm with a range of 70 to 207 bpm, 7 episodes of SVT with the fastest interval lasting 4 beats with a  maximum rate of 207 bpm and the longest interval lasting 5 beats.  SVT was detected with symptomatic patient events, rare atrial and ventricular ectopy were also identified.  In follow-up in 10/2021 she reported an improvement in her heart rates on Lopressor which was continued.  She comes in doing reasonably well from a cardiac perspective, without symptoms of angina or decompensation.  Symptoms of palpitations continue to be controlled with metoprolol.  She does note an increase in palpitations if she forgets to take a dose.  She also reports having been more sedentary following the passing of her sister and in the setting of the [place] pandemic.  With this, she finds of not wanting to leave her house at times.  She would like to lose weight through lifestyle modification.   Labs independently reviewed: 08/2022 - Hgb 12.3, PLT 414, potassium 4.1, BUN 16, serum creatinine 1.1, albumin 4.2, AST/ALT normal, A1c 6.9, TC 146, TG 196, HDL 40, LDL 67 02/2020 - TSH normal   Past Medical History:  Diagnosis Date   Acute pyelonephritis 12/25/2021   Arthritis    osteo   Asthma    Bipolar disorder ([place]) 05/21/2014   Cataracts, bilateral    COPD (chronic obstructive pulmonary disease) (HCC)    DDD (degenerative disc disease), cervical    also back   Depression    Diabetes mellitus without complication (HCC)    Dizziness    Positional   Edema    feet/legs   Fibromyalgia syndrome    Fungal infection    Finger nails   GERD (gastroesophageal reflux disease)  Gout    Heart palpitations    Hip dysplasia, congenital 09/15/2013   History of kidney stones    Hypercholesterolemia    IDA (iron deficiency anemia)    Multiple sclerosis (HCC)    weakness   Osteoporosis    osteoarthritis   Pneumonia    PONV (postoperative nausea and vomiting)    no problem after cataract surgery   Prediabetes 07/22/2018   Psoriasis    Shortness of breath dyspnea    wheezing   Sleep apnea 2012   sleep study /  slight, no interventions   Urinary frequency    Weight gain 06/21/2014    Past Surgical History:  Procedure Laterality Date   CATARACT EXTRACTION W/PHACO Left 05/21/2015   Procedure: CATARACT EXTRACTION PHACO AND INTRAOCULAR LENS PLACEMENT ([place]);  Surgeon: Birder Robson, MD;  Location: [facility];  Service: Ophthalmology;  Laterality: Left;  [place] 00:35 AP% 22.9 CDE 8.11 fluid pack lot #[id number]   CATARACT EXTRACTION W/PHACO Right 06/04/2015   Procedure: CATARACT EXTRACTION PHACO AND INTRAOCULAR LENS PLACEMENT ([facility]);  Surgeon: Birder Robson, MD;  Location: [facility];  Service: Ophthalmology;  Laterality: Right;  US:00:48 AP%: 10.5 CDE:5.08 Fluid lot #[id number]   CYSTOSCOPY/URETEROSCOPY/HOLMIUM LASER/STENT PLACEMENT Bilateral 09/22/2016   Procedure: CYSTOSCOPY/URETEROSCOPY/HOLMIUM LASER/STENT PLACEMENT;  Surgeon: Hollice Espy, MD;  Location: [facility];  Service: Urology;  Laterality: Bilateral;   EYE SURGERY  2015   tissue biopsy   FOOT SURGERY  2015   JOINT REPLACEMENT Left 2013   hip replacement   LITHOTRIPSY     PTOSIS REPAIR Bilateral 02/18/2016   Procedure: BILATERAL PTOSIS REPAIR UPPER EYELIDS;  Surgeon: Karle Starch, MD;  Location: [facility];  Service: Ophthalmology;  Laterality: Bilateral;  LEAVE PT EARLY AM   thumb surgery Right    TONSILLECTOMY  1973   TOTAL HIP ARTHROPLASTY Right 07/02/2021   Procedure: TOTAL HIP ARTHROPLASTY ANTERIOR APPROACH;  Surgeon: Gaynelle Arabian, MD;  Location: [facility];  Service: Orthopedics;  Laterality: Right;    Current Medications: Current Meds  Medication Sig   aspirin EC 81 MG tablet Take 81 mg by mouth at bedtime. Swallow whole.   Azelastine HCl 137 MCG/SPRAY SOLN PLACE 2 SPRAYS INTO BOTH NOSTRILS 2 (TWO) TIMES DAILY   B Complex-Biotin-FA (BIG 100, BIOTIN, PO) Take 300 mg by mouth at bedtime.   cetirizine (ZYRTEC) 10 MG tablet Take 10 mg by mouth 2 (two) times daily.   Continuous Blood Gluc Sensor (FREESTYLE LIBRE 3 SENSOR) MISC PLACE  1 SENSOR ON THE SKIN EVERY 14 DAYS. USE TO CHECK GLUCOSE CONTINUOUSLY   cyanocobalamin (VITAMIN B12) 1000 MCG/ML injection INJECT 1 ML (1,000 MCG TOTAL) INTO THE MUSCLE EVERY 30 (THIRTY) DAYS. FOR B12 VITAMIN   desipramine (NORPRAMIN) 25 MG tablet 2  qam   diphenhydrAMINE (BENADRYL) 2 % cream Apply topically 3 (three) times daily as needed for itching.   DULoxetine HCl 40 MG CPEP Take 20 mg by mouth 2 (two) times daily. 2 q day   estradiol (ESTRACE) 0.1 MG/GM vaginal cream Estrogen Cream Instruction Discard applicator Apply pea sized amount to tip of finger to urethra before bed. Wash hands well after application. Use Monday, Wednesday and Friday   Evolocumab (REPATHA SURECLICK) 983 MG/ML SOAJ Inject 140 mg into the skin every 14 (fourteen) days. PLEASE KEEP UPCOMING APPT TO ENSURE TIMELY REFILLS   hydrOXYzine (VISTARIL) 50 MG capsule Take 2 capsules (100 mg total) by mouth at bedtime. TAKE 1 CAPSLE BY MOUTH AT NOON, 1 AT 6 PM, AND 1  AS NEEDED   ibuprofen (ADVIL) 200 MG tablet Take 400 mg by mouth 2 (two) times daily.   ketoconazole (NIZORAL) 2 % shampoo Apply 1 Application topically as directed. Wash from waist up 3 times a week for 2 months, then decrease to 1 time a month, let sit 5 minutes and rinse off   lamoTRIgine (LAMICTAL) 150 MG tablet Take 2 tablets (300 mg total) by mouth daily at bedtime.   levalbuterol (XOPENEX HFA) 45 MCG/ACT inhaler Inhale 2 puffs into the lungs every 4 (four) hours as needed for wheezing.   Lysine 500 MG TABS Take 1,000 mg by mouth daily.   methocarbamol (ROBAXIN) 500 MG tablet TAKE 1-2 TABLETS (500-1,000 MG TOTAL) BY MOUTH AT BEDTIME AS NEEDED FOR MUSCLE SPASMS.   mupirocin ointment (BACTROBAN) 2 % Apply 1 Application topically daily. Qd to wound on finger   neomycin-bacitracin-polymyxin (NEOSPORIN) [id number] ointment Apply 1 application topically 4 (four) times daily as needed (for cut/scrapes.).   pantoprazole (PROTONIX) 40 MG tablet TAKE 1 TABLET (40 MG TOTAL)  BY MOUTH 2 (TWO) TIMES DAILY BEFORE A MEAL. FOR HEARTBURN   polyvinyl alcohol (LIQUIFILM TEARS) 1.4 % ophthalmic solution Place 2 drops into both eyes at bedtime.   Ruxolitinib Phosphate (OPZELURA) 1.5 % CREA Apply topically.   Semaglutide, 1 MG/DOSE, 4 MG/3ML SOPN Inject 1 mg as directed once a week. for diabetes.   STIOLTO RESPIMAT 2.5-2.5 MCG/ACT AERS Inhale 2 puffs into the lungs at bedtime.   temazepam (RESTORIL) 30 MG capsule TAKE ONE TO TWO CAPSULES BY MOUTH NIGHTLY AT BEDTIME   traZODone (DESYREL) 50 MG tablet Take 2 tablets (100 mg total) by mouth at bedtime.   trimethoprim (TRIMPEX) 100 MG tablet Take 1 tablet (100 mg total) by mouth daily.   valACYclovir (VALTREX) 1000 MG tablet Take 2 tablets by mouth twice daily for 1 day as needed for herpes outbreak.   Vitamin D3 (VITAMIN D) 25 MCG tablet Take 3,000 Units by mouth daily.   [DISCONTINUED] fenofibrate (TRICOR) 145 MG tablet Take 1 tablet (145 mg total) by mouth daily. For cholesterol. Please call [phone] for an appointment.   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[employer]: [employer]  Tobacco Use   Smoking status: Former    Packs/day: 1.00    Years: 25.00    Total pack years: 25.00    Types: Cigarettes    Quit date: 2019    Years since quitting: 5.0    Passive exposure: Past   Smokeless tobacco: Never   Tobacco comments:    occasional use  Vaping Use   Vaping Use: Former   Substance and Sexual Activity   Alcohol use: No    Alcohol/week: 0.0 standard drinks of alcohol   Drug use: Yes    Types: Methylphenidate, Marijuana    Comment: Delta 8 - once per day   Sexual activity: Never  Other Topics Concern   Not on file  Social History Narrative  Not on file   Social Determinants of Health   Financial Resource Strain: Low Risk  (10/01/2022)   Overall Financial Resource Strain (CARDIA)    Difficulty of Paying Living Expenses: Not very hard  Food Insecurity: No Food Insecurity (10/01/2022)   Hunger Vital Sign    Worried About Running Out of Food in the Last Year: Never true    Ran Out of Food in the Last Year: Never true  Transportation Needs: No Transportation Needs (10/01/2022)   PRAPARE - Hydrologist (Medical): No    Lack of Transportation (Non-Medical): No  Physical Activity: Insufficiently Active (10/01/2022)   Exercise Vital Sign    Days of Exercise per Week: 2 days    Minutes of Exercise per Session: 20 min  Stress: No Stress Concern Present (10/01/2022)   [place]    Feeling of Stress : Only a little  Social Connections: Socially Isolated (10/01/2022)   Social Connection and Isolation Panel [NHANES]    Frequency of Communication with Friends and Family: Three times a week    Frequency of Social Gatherings with Friends and Family: Three times a week    Attends Religious Services: Never    Active Member of Clubs or Organizations: No    Attends Archivist Meetings: Never    Marital Status: Divorced     Family History:  The patient's family history includes Cancer in her father and mother; Heart disease in her mother. There is no history of Kidney disease, Bladder Cancer, Prostate cancer, or Kidney cancer.  ROS:   12-point review of systems is negative unless otherwise noted in the HPI   EKGs/Labs/Other Studies Reviewed:    Studies  reviewed were summarized above. The additional studies were reviewed today:  Zio patch 08/2021: Monitor 1 Patient had a min HR of 78 bpm, max HR of 123 bpm, and avg HR of 93 bpm. Predominant underlying rhythm was Sinus Rhythm. Isolated SVEs were rare (<1.0%), and no SVE Couplets or SVE Triplets were present. Isolated VEs were rare (<1.0%), VE Couplets were  rare (<1.0%), and no VE Triplets were present.    Monitor 2 Patient had a min HR of 70 bpm, max HR of 207 bpm, and avg HR of 90 bpm. Predominant underlying rhythm was Sinus Rhythm. 7 Supraventricular Tachycardia runs occurred, the run with the fastest interval lasting 4 beats with a max rate of 207 bpm, the  longest lasting 5 beats with an avg rate of 120 bpm. Supraventricular Tachycardia was detected within +/- 45 seconds of symptomatic patient event(s). Isolated SVEs were rare (<1.0%), SVE Couplets were rare (<1.0%), and SVE Triplets were rare (<1.0%).  Isolated VEs were rare (<1.0%), VE Couplets were rare (<1.0%), and no VE Triplets were present. __________  2D echo 06/05/2021: 1. Left ventricular ejection fraction, by estimation, is 60 to 65%. The  left ventricle has normal function. The left ventricle has no regional  wall motion abnormalities. Left ventricular diastolic parameters are  consistent with Grade I diastolic  dysfunction (impaired relaxation).   2. Right ventricular systolic function is normal. The right ventricular  size is normal. Tricuspid regurgitation signal is inadequate for assessing  PA pressure.   3. Left atrial size was mildly dilated.  __________   Calcium score 03/11/2020: IMPRESSION: Coronary calcium score of 0.   EKG:  EKG is ordered today.  The EKG ordered today demonstrates NSR, 87 bpm, poor R wave progression along  the precordial leads, no acute ST-T changes  Recent Labs: 03/23/2022: B Natriuretic Peptide 99.7 08/20/2022: ALT 10; BUN 16; Creatinine, Ser 1.10; Hemoglobin 12.3; Platelets 414.0;  Potassium 4.1; Sodium 141  Recent Lipid Panel    Component Value Date/Time   CHOL 146 08/20/2022 1508   TRIG 196.0 (H) 08/20/2022 1508   HDL 40.50 08/20/2022 1508   CHOLHDL 4 08/20/2022 1508   VLDL 39.2 08/20/2022 1508   LDLCALC 67 08/20/2022 1508   LDLDIRECT 53.0 08/01/2020 1254    PHYSICAL EXAM:    VS:  BP 118/80 (BP Location: Left Arm, Patient Position: Sitting, Cuff Size: Normal)   Pulse 87   Ht '5\' 9"'$  (1.753 m)   Wt 244 lb 3.2 oz (110.8 kg)   LMP 08/23/2014   SpO2 98%   BMI 36.06 kg/m   BMI: Body mass index is 36.06 kg/m.  Physical Exam Vitals reviewed.  Constitutional:      Appearance: She is well-developed.  HENT:     Head: Normocephalic and atraumatic.  Eyes:     General:        Right eye: No discharge.        Left eye: No discharge.  Neck:     Vascular: No JVD.  Cardiovascular:     Rate and Rhythm: Normal rate and regular rhythm.     Heart sounds: Normal heart sounds, S1 normal and S2 normal. Heart sounds not distant. No midsystolic click and no opening snap. No murmur heard.    No friction rub.  Pulmonary:     Effort: Pulmonary effort is normal. No respiratory distress.     Breath sounds: Normal breath sounds. No decreased breath sounds, wheezing or rales.  Chest:     Chest wall: No tenderness.  Abdominal:     General: There is no distension.  Musculoskeletal:     Cervical back: Normal range of motion.  Skin:    General: Skin is warm and dry.     Nails: There is no clubbing.  Neurological:     Mental Status: She is alert and oriented to person, place, and time.  Psychiatric:        Speech: Speech normal.        Behavior: Behavior normal.        Thought Content: Thought content normal.        Judgment: Judgment normal.     Wt Readings from Last 3 Encounters:  12/01/22 244 lb 3.2 oz (110.8 kg)  10/01/22 243 lb (110.2 kg)  08/20/22 243 lb (110.2 kg)     ASSESSMENT & PLAN:   Inappropriate sinus tachycardia: Symptoms are controlled on  metoprolol tartrate 50 mg twice daily.  HLD: LDL 67 in 08/2022 with normal AST/ALT at that time.  Intolerant to statins.  Remains on Repatha and fenofibrate.  Obesity: Likely exacerbated with diet and sedentary lifestyle.  She is agreeable to referral to the healthy weight and wellness center.  With underlying MS, her functional status is somewhat limited.  May benefit from referral to PT if felt indicated by PCP.   Disposition: F/u with Dr. Garen Lah or an APP in 6 months.   Medication Adjustments/Labs and Tests Ordered: Current medicines are reviewed at length with the patient today.  Concerns regarding medicines are outlined above. Medication changes, Labs and Tests ordered today are summarized above and listed in the Patient Instructions accessible in Encounters.   Signed, Christell Faith, PA-C 12/01/2022 4:42 PM     [facility] [address] [place], [place] [address] [phone]

## 2022-12-01 ENCOUNTER — Encounter: Payer: Self-pay | Admitting: Physician Assistant

## 2022-12-01 ENCOUNTER — Ambulatory Visit: Payer: PPO | Attending: Physician Assistant | Admitting: Physician Assistant

## 2022-12-01 VITALS — BP 118/80 | HR 87 | Ht 69.0 in | Wt 244.2 lb

## 2022-12-01 DIAGNOSIS — E669 Obesity, unspecified: Secondary | ICD-10-CM | POA: Diagnosis not present

## 2022-12-01 DIAGNOSIS — E782 Mixed hyperlipidemia: Secondary | ICD-10-CM | POA: Diagnosis not present

## 2022-12-01 DIAGNOSIS — Z6836 Body mass index (BMI) 36.0-36.9, adult: Secondary | ICD-10-CM

## 2022-12-01 DIAGNOSIS — I4711 Inappropriate sinus tachycardia, so stated: Secondary | ICD-10-CM

## 2022-12-01 MED ORDER — METOPROLOL TARTRATE 50 MG PO TABS: 50.0000 mg | ORAL_TABLET | Freq: Two times a day (BID) | ORAL | 3 refills | Status: DC

## 2022-12-01 MED ORDER — FENOFIBRATE 145 MG PO TABS: 145.0000 mg | ORAL_TABLET | Freq: Every day | ORAL | 3 refills | Status: DC

## 2022-12-01 MED FILL — Iron Sucrose Inj 20 MG/ML (Fe Equiv): INTRAVENOUS | Qty: 10 | Status: AC

## 2022-12-01 NOTE — Telephone Encounter (Signed)
Please schedule patient a follow up visit to discuss her many concerns. Needs to be scheduled at the end of a session (either morning or afternoon).

## 2022-12-01 NOTE — Telephone Encounter (Signed)
Called and scheduled patient 12/03/22 '@3'$ :00pm

## 2022-12-01 NOTE — Patient Instructions (Signed)
Referral has been placed for [facility] Phone # [phone]. This can take a few months to get appointment.   Medication Instructions:  No changes at this time.   *If you need a refill on your cardiac medications before your next appointment, please call your pharmacy*   Lab Work: None  If you have labs (blood work) drawn today and your tests are completely normal, you will receive your results only by: [place] (if you have MyChart) OR A paper copy in the mail If you have any lab test that is abnormal or we need to change your treatment, we will call you to review the results.   Testing/Procedures: None   Follow-Up: At [facility], you and your health needs are our priority.  As part of our continuing mission to provide you with exceptional heart care, we have created designated Provider Care Teams.  These Care Teams include your primary Cardiologist (physician) and Advanced Practice Providers (APPs -  Physician Assistants and Nurse Practitioners) who all work together to provide you with the care you need, when you need it.   Your next appointment:   6 month(s)  Provider:   [name] Sable, MD or Christell Faith, PA-C

## 2022-12-02 ENCOUNTER — Inpatient Hospital Stay: Payer: PPO | Admitting: Medical Oncology

## 2022-12-02 ENCOUNTER — Inpatient Hospital Stay: Payer: PPO

## 2022-12-03 ENCOUNTER — Encounter: Payer: Self-pay | Admitting: Primary Care

## 2022-12-03 ENCOUNTER — Ambulatory Visit (INDEPENDENT_AMBULATORY_CARE_PROVIDER_SITE_OTHER): Payer: PPO | Admitting: Primary Care

## 2022-12-03 VITALS — BP 128/80 | HR 97 | Temp 96.3°F | Ht 69.0 in | Wt 244.0 lb

## 2022-12-03 DIAGNOSIS — T783XXS Angioneurotic edema, sequela: Secondary | ICD-10-CM | POA: Diagnosis not present

## 2022-12-03 DIAGNOSIS — T783XXA Angioneurotic edema, initial encounter: Secondary | ICD-10-CM | POA: Insufficient documentation

## 2022-12-03 DIAGNOSIS — E1165 Type 2 diabetes mellitus with hyperglycemia: Secondary | ICD-10-CM

## 2022-12-03 DIAGNOSIS — L508 Other urticaria: Secondary | ICD-10-CM | POA: Diagnosis not present

## 2022-12-03 LAB — POCT GLYCOSYLATED HEMOGLOBIN (HGB A1C): Hemoglobin A1C: 6.2 % — AB (ref 4.0–5.6)

## 2022-12-03 MED ORDER — GLIPIZIDE ER 5 MG PO TB24: 5.0000 mg | ORAL_TABLET | Freq: Every day | ORAL | 0 refills | Status: DC

## 2022-12-03 NOTE — Assessment & Plan Note (Signed)
Following with dermatology, reviewed office notes from October 2023.  Evident on exam today. Will stop Ozempic as angioedema is a rare side effect. Not on ACE-I or ARB.  She will update.

## 2022-12-03 NOTE — Assessment & Plan Note (Signed)
Following with dermatology, reviewed office notes from October 2023.  Continue Zyrtec 10 mg daily, pantoprazole 40 mg BID, Astelin nasal spray, various creams per dermatology.

## 2022-12-03 NOTE — Assessment & Plan Note (Signed)
Controlled and improved with A1C today of 6.2!  Reviewed side effects from Ozempic through Up to Date and angioedema is mentioned as a rare side effect. Given her chronic angioedema, we will discontinue Ozempic for now.  Stop Ozempic 1 mg weekly. Start Glipizide XL 5 mg daily. She cannot tolerate metformin.    She will closely update, especially if she experiences any itching. She will update regarding the angioedema.   Follow up in 3 months.

## 2022-12-03 NOTE — Progress Notes (Signed)
Subjective:    Patient ID: Emily Moon, female    DOB: 11-Dec-1960, 62 y.o.   MRN: [id number]  HPI  Emily Moon is a very pleasant 62 y.o. female  has a past medical history of Acute pyelonephritis (12/25/2021), Arthritis, Asthma, Bipolar disorder ([place]) (05/21/2014), Cataracts, bilateral, COPD (chronic obstructive pulmonary disease) ([place]), DDD (degenerative disc disease), cervical, Depression, Diabetes mellitus without complication ([place]), Dizziness, Edema, Fibromyalgia syndrome, Fungal infection, GERD (gastroesophageal reflux disease), Gout, Heart palpitations, Hip dysplasia, congenital (09/15/2013), History of kidney stones, Hypercholesterolemia, IDA (iron deficiency anemia), Multiple sclerosis ([place]), Osteoporosis, Pneumonia, PONV (postoperative nausea and vomiting), Prediabetes (07/22/2018), Psoriasis, Shortness of breath dyspnea, Sleep apnea (2012), Urinary frequency, and Weight gain (06/21/2014). who presents today with several requests.  1) Type 2 Diabetes:  Current medications include: Ozempic 1 mg weekly.   She is checking her blood glucose continuously and is getting readings ranging mostly in the 120's-130's. She's had a few readings in the 200's with eating rice and unhealthy food.  She suspects that her Ozempic could be causing her rashes and angioedema. She is not managed on ACE-I or ARB. She cannot tolerate metformin. She's never tried Glipizide.   Last A1C: 6.9 in October 2023, 6.2 today Last Eye Exam: UTD Last Foot Exam: UTD Pneumonia Vaccination: 2016 Urine Microalbumin: UTD Statin: None. Repatha  Dietary changes since last visit: Poor diet over the holidays. Working to get back on track.   Exercise: None  She reached out to [place] via MyChart requesting endocrinology referral for further management of her diabetes.   2) Allergic Reaction: Following with dermatology for various rashes including pityriasis rosea vs gyrate erythema, also urticaria and angioedema,  tinea versicolor.   Last evaluated by dermatology in October 2023. Her Opezular cream was continued, Zyrtec 10 mg was continued, Singulair 10 mg HS was added, Xolair was considered but not added, ketoconazole 2% shampoo wa added, and mupirocin was added for an acute burn.  She continues to experience angioedema and rashes. She's concerned that Ozempic could be contributing.   She has not seen an allergist.   BP Readings from Last 3 Encounters:  12/03/22 128/80  12/01/22 118/80  09/28/22 103/69     Review of Systems  Respiratory:         Chronic angioedema   Skin:  Positive for rash.         Past Medical History:  Diagnosis Date   Acute pyelonephritis 12/25/2021   Arthritis    osteo   Asthma    Bipolar disorder ([place]) 05/21/2014   Cataracts, bilateral    COPD (chronic obstructive pulmonary disease) (HCC)    DDD (degenerative disc disease), cervical    also back   Depression    Diabetes mellitus without complication (HCC)    Dizziness    Positional   Edema    feet/legs   Fibromyalgia syndrome    Fungal infection    Finger nails   GERD (gastroesophageal reflux disease)    Gout    Heart palpitations    Hip dysplasia, congenital 09/15/2013   History of kidney stones    Hypercholesterolemia    IDA (iron deficiency anemia)    Multiple sclerosis (HCC)    weakness   Osteoporosis    osteoarthritis   Pneumonia    PONV (postoperative nausea and vomiting)    no problem after cataract surgery   Prediabetes 07/22/2018   Psoriasis    Shortness of breath dyspnea    wheezing   Sleep apnea  2012   sleep study / slight, no interventions   Urinary frequency    Weight gain 06/21/2014    Social History   Socioeconomic History   Marital status: Divorced    Spouse name: Not on file   Number of children: 1   Years of education: Not on file   Highest education level: Not on file  Occupational History   Occupation: Therapist, 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   Tobacco Use   Smoking status: Former    Packs/day: 1.00    Years: 25.00    Total pack years: 25.00    Types: Cigarettes    Quit date: 2019    Years since quitting: 5.0    Passive exposure: Past   Smokeless tobacco: Never   Tobacco comments:    occasional use  Vaping Use   Vaping Use: Former  Substance and Sexual Activity   Alcohol use: No    Alcohol/week: 0.0 standard drinks of alcohol   Drug use: Yes    Types: Methylphenidate, Marijuana    Comment: Delta 8 - once per day   Sexual activity: Never  Other Topics Concern   Not on file  Social History Narrative   Not on file   Social Determinants of Health   Financial Resource Strain: Low Risk  (10/01/2022)   Overall Financial Resource Strain (CARDIA)    Difficulty of Paying Living Expenses: Not very hard  Food Insecurity: No Food Insecurity (10/01/2022)   Hunger Vital Sign    Worried About Running Out of Food in the Last Year: Never true    Ran Out of Food in the Last Year: Never true  Transportation Needs: No Transportation Needs (10/01/2022)   PRAPARE - Hydrologist (Medical): No    Lack of Transportation (Non-Medical): No  Physical Activity: Insufficiently Active (10/01/2022)   Exercise Vital Sign    Days of Exercise per Week: 2 days    Minutes of Exercise per Session: 20 min  Stress: No Stress Concern Present (10/01/2022)   [place]    Feeling of Stress : Only a little  Social Connections: Socially Isolated (10/01/2022)   Social Connection and Isolation Panel [NHANES]    Frequency of Communication with Friends and Family: Three times a week    Frequency of Social Gatherings with Friends and Family: Three times a week    Attends Religious Services: Never    Active Member of Clubs or Organizations: No    Attends Archivist Meetings: Never    Marital Status: Divorced  Human resources officer Violence: Not At Risk  (10/01/2022)   Humiliation, Afraid, Rape, and Kick questionnaire    Fear of Current or Ex-Partner: No    Emotionally Abused: No    Physically Abused: No    Sexually Abused: No    Past Surgical History:  Procedure Laterality Date   CATARACT EXTRACTION W/PHACO Left 05/21/2015   Procedure: CATARACT EXTRACTION PHACO AND INTRAOCULAR LENS PLACEMENT ([place]);  Surgeon: Birder Robson, MD;  Location: [facility];  Service: Ophthalmology;  Laterality: Left;  [place] 00:35 AP% 22.9 CDE 8.11 fluid pack lot #[id number]   CATARACT EXTRACTION W/PHACO Right 06/04/2015   Procedure: CATARACT EXTRACTION PHACO AND INTRAOCULAR LENS PLACEMENT ([facility]);  Surgeon: Birder Robson, MD;  Location: [facility];  Service: Ophthalmology;  Laterality: Right;  US:00:48 AP%: 10.5 CDE:5.08 Fluid lot #[id number]   CYSTOSCOPY/URETEROSCOPY/HOLMIUM LASER/STENT PLACEMENT Bilateral 09/22/2016   Procedure: CYSTOSCOPY/URETEROSCOPY/HOLMIUM  LASER/STENT PLACEMENT;  Surgeon: Hollice Espy, MD;  Location: [facility];  Service: Urology;  Laterality: Bilateral;   EYE SURGERY  2015   tissue biopsy   FOOT SURGERY  2015   JOINT REPLACEMENT Left 2013   hip replacement   LITHOTRIPSY     PTOSIS REPAIR Bilateral 02/18/2016   Procedure: BILATERAL PTOSIS REPAIR UPPER EYELIDS;  Surgeon: Karle Starch, MD;  Location: [facility];  Service: Ophthalmology;  Laterality: Bilateral;  LEAVE PT EARLY AM   thumb surgery Right    TONSILLECTOMY  1973   TOTAL HIP ARTHROPLASTY Right 07/02/2021   Procedure: TOTAL HIP ARTHROPLASTY ANTERIOR APPROACH;  Surgeon: Gaynelle Arabian, MD;  Location: [facility];  Service: Orthopedics;  Laterality: Right;    Family History  Problem Relation Age of Onset   Cancer Father        Abdomen with mastasis   Cancer Mother    Heart disease Mother    Kidney disease Neg Hx    Bladder Cancer Neg Hx    Prostate cancer Neg Hx    Kidney cancer Neg Hx     Allergies  Allergen Reactions   Albuterol Shortness Of Breath and Other (See  Comments)    Makes pt feel jittery/ tacycardic   Crestor [Rosuvastatin] Other (See Comments)    Joint pain, muscle pain, and hair loss   Halcion [Triazolam] Other (See Comments)    Dizziness,headaches,bladder problems   Levaquin [Levofloxacin In D5w] Diarrhea and Itching    Shoulder pain   Metformin And Related Rash   Naproxen Sodium     Patient tolerates in small doses. Her reaction is swelling of the lower extremities.    Cefaclor Other (See Comments)    Doesn't remember---unsure if actually allergic    Sulfa Antibiotics Itching    Unsure of reaction possibly itching   Tramadol Itching and Nausea And Vomiting   Zinc Other (See Comments)    constipation  constipation    Aripiprazole Other (See Comments)    Muscle tension/cramping   Diclofenac Sodium Rash    "made very sick"    Current Outpatient Medications on File Prior to Visit  Medication Sig Dispense Refill   aspirin EC 81 MG tablet Take 81 mg by mouth at bedtime. Swallow whole.     Azelastine HCl 137 MCG/SPRAY SOLN PLACE 2 SPRAYS INTO BOTH NOSTRILS 2 (TWO) TIMES DAILY 30 mL 5   B Complex-Biotin-FA (BIG 100, BIOTIN, PO) Take 300 mg by mouth at bedtime.     cetirizine (ZYRTEC) 10 MG tablet Take 10 mg by mouth 2 (two) times daily.     clindamycin (CLEOCIN T) 1 % external solution Apply to scalp once or twice a day prn bumps 30 mL 2   Continuous Blood Gluc Sensor (FREESTYLE LIBRE 3 SENSOR) MISC PLACE 1 SENSOR ON THE SKIN EVERY 14 DAYS. USE TO CHECK GLUCOSE CONTINUOUSLY 6 each 1   cyanocobalamin (VITAMIN B12) 1000 MCG/ML injection INJECT 1 ML (1,000 MCG TOTAL) INTO THE MUSCLE EVERY 30 (THIRTY) DAYS. FOR B12 VITAMIN 3 mL 0   desipramine (NORPRAMIN) 25 MG tablet 2  qam 60 tablet 6   diphenhydrAMINE (BENADRYL) 2 % cream Apply topically 3 (three) times daily as needed for itching.     DULoxetine HCl 40 MG CPEP Take 20 mg by mouth 2 (two) times daily. 2 q day 60 capsule 4   estradiol (ESTRACE) 0.1 MG/GM vaginal cream Estrogen Cream  Instruction Discard applicator Apply pea sized amount to tip of finger to  urethra before bed. Wash hands well after application. Use Monday, Wednesday and Friday 42.5 g 11   Evolocumab (REPATHA SURECLICK) 832 MG/ML SOAJ Inject 140 mg into the skin every 14 (fourteen) days. PLEASE KEEP UPCOMING APPT TO ENSURE TIMELY REFILLS 2 mL 01   fenofibrate (TRICOR) 145 MG tablet Take 1 tablet (145 mg total) by mouth daily. For cholesterol. Please call [phone] for an appointment. 90 tablet 3   hydrOXYzine (VISTARIL) 50 MG capsule Take 2 capsules (100 mg total) by mouth at bedtime. TAKE 1 CAPSLE BY MOUTH AT NOON, 1 AT 6 PM, AND 1 AS NEEDED 90 capsule 3   ibuprofen (ADVIL) 200 MG tablet Take 400 mg by mouth 2 (two) times daily.     lamoTRIgine (LAMICTAL) 150 MG tablet Take 2 tablets (300 mg total) by mouth daily at bedtime. 60 tablet 1   levalbuterol (XOPENEX HFA) 45 MCG/ACT inhaler Inhale 2 puffs into the lungs every 4 (four) hours as needed for wheezing.     Lysine 500 MG TABS Take 1,000 mg by mouth daily.     methocarbamol (ROBAXIN) 500 MG tablet TAKE 1-2 TABLETS (500-1,000 MG TOTAL) BY MOUTH AT BEDTIME AS NEEDED FOR MUSCLE SPASMS. 180 tablet 0   metoprolol tartrate (LOPRESSOR) 50 MG tablet Take 1 tablet (50 mg total) by mouth 2 (two) times daily. 180 tablet 3   mupirocin ointment (BACTROBAN) 2 % Apply 1 Application topically daily. Qd to wound on finger 22 g 1   neomycin-bacitracin-polymyxin (NEOSPORIN) [phone] ointment Apply 1 application topically 4 (four) times daily as needed (for cut/scrapes.).     pantoprazole (PROTONIX) 40 MG tablet TAKE 1 TABLET (40 MG TOTAL) BY MOUTH 2 (TWO) TIMES DAILY BEFORE A MEAL. FOR HEARTBURN 180 tablet 3   polyvinyl alcohol (LIQUIFILM TEARS) 1.4 % ophthalmic solution Place 2 drops into both eyes at bedtime.     Ruxolitinib Phosphate (OPZELURA) 1.5 % CREA Apply topically.     Semaglutide, 1 MG/DOSE, 4 MG/3ML SOPN Inject 1 mg as directed once a week. for diabetes. 9 mL 0    STIOLTO RESPIMAT 2.5-2.5 MCG/ACT AERS Inhale 2 puffs into the lungs at bedtime.     temazepam (RESTORIL) 30 MG capsule TAKE ONE TO TWO CAPSULES BY MOUTH NIGHTLY AT BEDTIME 60 capsule 5   traZODone (DESYREL) 50 MG tablet Take 2 tablets (100 mg total) by mouth at bedtime. 60 tablet 7   trimethoprim (TRIMPEX) 100 MG tablet Take 1 tablet (100 mg total) by mouth daily. 30 tablet 11   valACYclovir (VALTREX) 1000 MG tablet Take 2 tablets by mouth twice daily for 1 day as needed for herpes outbreak. 12 tablet 0   Vitamin D3 (VITAMIN D) 25 MCG tablet Take 3,000 Units by mouth daily.     ketoconazole (NIZORAL) 2 % shampoo Apply 1 Application topically as directed. Wash from waist up 3 times a week for 2 months, then decrease to 1 time a month, let sit 5 minutes and rinse off (Patient not taking: Reported on 12/03/2022) 120 mL 6   No current facility-administered medications on file prior to visit.    BP 128/80   Pulse 97   Temp (!) 96.3 F (35.7 C) (Temporal)   Ht '5\' 9"'$  (1.753 m)   Wt 244 lb (110.7 kg)   LMP 08/23/2014   SpO2 98%   BMI 36.03 kg/m  Objective:   Physical Exam Cardiovascular:     Rate and Rhythm: Normal rate and regular rhythm.  Pulmonary:     Effort:  Pulmonary effort is normal.     Breath sounds: Normal breath sounds.     Comments: Mild angioedema noted to right lower lip and cheek. Musculoskeletal:     Cervical back: Neck supple.  Skin:    General: Skin is warm and dry.           Assessment & Plan:  Type 2 diabetes mellitus with hyperglycemia, without long-term current use of insulin (HCC) Assessment & Plan: Controlled and improved with A1C today of 6.2!  Reviewed side effects from Ozempic through Up to Date and angioedema is mentioned as a rare side effect. Given her chronic angioedema, we will discontinue Ozempic for now.  Stop Ozempic 1 mg weekly. Start Glipizide XL 5 mg daily. She cannot tolerate metformin.    She will closely update, especially if she  experiences any itching. She will update regarding the angioedema.   Follow up in 3 months.  Orders: -     POCT glycosylated hemoglobin (Hb A1C) -     glipiZIDE ER; Take 1 tablet (5 mg total) by mouth daily with breakfast. for diabetes.  Dispense: 90 tablet; Refill: 0  Chronic urticaria Assessment & Plan: Following with dermatology, reviewed office notes from October 2023.  Continue Zyrtec 10 mg daily, pantoprazole 40 mg BID, Astelin nasal spray, various creams per dermatology.    Angioedema, sequela Assessment & Plan: Following with dermatology, reviewed office notes from October 2023.  Evident on exam today. Will stop Ozempic as angioedema is a rare side effect. Not on ACE-I or ARB.  She will update.          [name], NP

## 2022-12-08 ENCOUNTER — Ambulatory Visit (HOSPITAL_BASED_OUTPATIENT_CLINIC_OR_DEPARTMENT_OTHER): Payer: PPO | Admitting: Psychiatry

## 2022-12-08 ENCOUNTER — Other Ambulatory Visit: Payer: Self-pay

## 2022-12-08 ENCOUNTER — Other Ambulatory Visit (HOSPITAL_COMMUNITY): Payer: Self-pay | Admitting: Psychiatry

## 2022-12-08 DIAGNOSIS — F319 Bipolar disorder, unspecified: Secondary | ICD-10-CM | POA: Diagnosis not present

## 2022-12-08 DIAGNOSIS — F310 Bipolar disorder, current episode hypomanic: Secondary | ICD-10-CM | POA: Diagnosis not present

## 2022-12-08 MED ORDER — DESIPRAMINE HCL 25 MG PO TABS: ORAL_TABLET | ORAL | 6 refills | Status: DC

## 2022-12-08 MED ORDER — REPATHA SURECLICK 140 MG/ML ~~LOC~~ SOAJ: 140.0000 mg | SUBCUTANEOUS | 5 refills | Status: DC

## 2022-12-08 MED ORDER — TEMAZEPAM 30 MG PO CAPS: ORAL_CAPSULE | ORAL | 5 refills | Status: DC

## 2022-12-08 MED ORDER — LAMOTRIGINE 150 MG PO TABS: ORAL_TABLET | ORAL | 6 refills | Status: DC

## 2022-12-08 MED ORDER — DULOXETINE HCL 40 MG PO CPEP: 20.0000 mg | ORAL_CAPSULE | Freq: Two times a day (BID) | ORAL | 4 refills | Status: DC

## 2022-12-08 MED ORDER — HYDROXYZINE PAMOATE 50 MG PO CAPS: 100.0000 mg | ORAL_CAPSULE | Freq: Every day | ORAL | 3 refills | Status: DC

## 2022-12-08 MED ORDER — TRAZODONE HCL 50 MG PO TABS: 100.0000 mg | ORAL_TABLET | Freq: Every day | ORAL | 7 refills | Status: DC

## 2022-12-08 NOTE — Progress Notes (Signed)
The most painful.  This  Psychiatric Initial Adult Assessment   Patient Identification: Emily Moon MRN:  [id number] Date of Evaluation:  12/08/2022 Referral Source: From the community Chief Complaint: Feels exhausted  Today the patient is fairly well.  Unfortunately her therapist did not work out.  She says she needs somebody more directive and focus was somewhat challenging.  Unfortunately I am not aware of someone in her community who is a Education officer, museum or psychologist who is available a patient on disability.  The patient ideally 1 somebody in her community which is near [place].  Generally the patient is sleeping and eating fairly well.  She takes trazodone and is on a CPAP machine.  She is also on high-dose Restoril.  She denies being persistently depressed and she does not appear to be euphoric or elated.  She actually seems to be somewhat calmer and somewhat less verbal.  I think she would clearly benefit by being in the therapy relationship with somebody who is directed.  Her issues are corresponding purpose and passion in her life but she has multiple medical issues.  Procedure(s) Performed: * No surgery found *  Anesthes  Patient location:   Post pain:   Post assessment:   Last Vitals:  There were no vitals filed for this visit.  Post vital signs:   Level of consciousness:   Complications: . Associated Signs/Symptoms: Depression Symptoms:  hopelessness, (Hypo) Manic Symptoms:   Anxiety Symptoms:   Psychotic Symptoms:   PTSD Symptoms:   Past Psychiatric History: The patient was seen in a psychiatric office and takes multiple medications she's not been recently hospitalized.  Previous Psychotropic Medications: Yes   Substance Abuse History in the last 12 months:  No.  Consequences of Substance Abuse: Negative  Past Medical History:  Past Medical History:  Diagnosis Date   Acute pyelonephritis 12/25/2021   Arthritis    osteo   Asthma    Bipolar disorder  ([place]) 05/21/2014   Cataracts, bilateral    COPD (chronic obstructive pulmonary disease) (HCC)    DDD (degenerative disc disease), cervical    also back   Depression    Diabetes mellitus without complication (HCC)    Dizziness    Positional   Edema    feet/legs   Fibromyalgia syndrome    Fungal infection    Finger nails   GERD (gastroesophageal reflux disease)    Gout    Heart palpitations    Hip dysplasia, 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   History of kidney stones    Hypercholesterolemia    IDA (iron deficiency anemia)    Multiple sclerosis (HCC)    weakness   Osteoporosis    osteoarthritis   Pneumonia    PONV (postoperative nausea and vomiting)    no problem after cataract surgery   Prediabetes 07/22/2018   Psoriasis    Shortness of breath dyspnea    wheezing   Sleep apnea 2012   sleep study / slight, no interventions   Urinary frequency    Weight gain 06/21/2014    Past Surgical History:  Procedure Laterality Date   CATARACT EXTRACTION W/PHACO Left 05/21/2015   Procedure: CATARACT EXTRACTION PHACO AND INTRAOCULAR LENS PLACEMENT ([place]);  Surgeon: Birder Robson, MD;  Location: [facility];  Service: Ophthalmology;  Laterality: Left;  [place] 00:35 AP% 22.9 CDE 8.11 fluid pack lot #[id number]   CATARACT EXTRACTION W/PHACO Right 06/04/2015   Procedure: CATARACT EXTRACTION PHACO AND INTRAOCULAR LENS PLACEMENT ([facility]);  Surgeon: Birder Robson, MD;  Location: [facility];  Service: Ophthalmology;  Laterality: Right;  US:00:48 AP%: 10.5 CDE:5.08 Fluid lot #[id number]   CYSTOSCOPY/URETEROSCOPY/HOLMIUM LASER/STENT PLACEMENT Bilateral 09/22/2016   Procedure: CYSTOSCOPY/URETEROSCOPY/HOLMIUM LASER/STENT PLACEMENT;  Surgeon: Hollice Espy, MD;  Location: [facility];  Service: Urology;  Laterality: Bilateral;   EYE SURGERY  2015   tissue biopsy   FOOT SURGERY  2015   JOINT REPLACEMENT Left 2013   hip replacement   LITHOTRIPSY     PTOSIS REPAIR Bilateral 02/18/2016   Procedure: BILATERAL PTOSIS REPAIR  UPPER EYELIDS;  Surgeon: Karle Starch, MD;  Location: [facility];  Service: Ophthalmology;  Laterality: Bilateral;  LEAVE PT EARLY AM   thumb surgery Right    TONSILLECTOMY  1973   TOTAL HIP ARTHROPLASTY Right 07/02/2021   Procedure: TOTAL HIP ARTHROPLASTY ANTERIOR APPROACH;  Surgeon: Gaynelle Arabian, MD;  Location: [facility];  Service: Orthopedics;  Laterality: Right;    Family Psychiatric History:   Family History:  Family History  Problem Relation Age of Onset   Cancer Father        Abdomen with mastasis   Cancer Mother    Heart disease Mother    Kidney disease Neg Hx    Bladder Cancer Neg Hx    Prostate cancer Neg Hx    Kidney cancer Neg Hx     Social History:   Social History   Socioeconomic History   Marital status: Divorced    Spouse name: Not on file   Number of children: 1   Years of education: Not on file   Highest education level: Not on file  Occupational History   Occupation: Therapist, 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  Tobacco Use   Smoking status: Former    Packs/day: 1.00    Years: 25.00    Total pack years: 25.00    Types: Cigarettes    Quit date: 2019    Years since quitting: 5.0    Passive exposure: Past   Smokeless tobacco: Never   Tobacco comments:    occasional use  Vaping Use   Vaping Use: Former  Substance and Sexual Activity   Alcohol use: No    Alcohol/week: 0.0 standard drinks of alcohol   Drug use: Yes    Types: Methylphenidate, Marijuana    Comment: Delta 8 - once per day   Sexual activity: Never  Other Topics Concern   Not on file  Social History Narrative   Not on file   Social Determinants of Health   Financial Resource Strain: Low Risk  (10/01/2022)   Overall Financial Resource Strain (CARDIA)    Difficulty of Paying Living Expenses: Not very hard  Food Insecurity: No Food Insecurity (10/01/2022)   Hunger Vital Sign    Worried About Running Out of Food in the Last Year: Never true    Ran Out of Food in  the Last Year: Never true  Transportation Needs: No Transportation Needs (10/01/2022)   PRAPARE - Hydrologist (Medical): No    Lack of Transportation (Non-Medical): No  Physical Activity: Insufficiently Active (10/01/2022)   Exercise Vital Sign    Days of Exercise per Week: 2 days    Minutes of Exercise per Session: 20 min  Stress: No Stress Concern Present (10/01/2022)   [place]    Feeling of Stress : Only a little  Social Connections: Socially Isolated (10/01/2022)   Social Connection and Isolation Panel [NHANES]    Frequency  of Communication with Friends and Family: Three times a week    Frequency of Social Gatherings with Friends and Family: Three times a week    Attends Religious Services: Never    Active Member of Clubs or Organizations: No    Attends Archivist Meetings: Never    Marital Status: Divorced    Additional Social History:   Allergies:   Allergies  Allergen Reactions   Albuterol Shortness Of Breath and Other (See Comments)    Makes pt feel jittery/ tacycardic   Crestor [Rosuvastatin] Other (See Comments)    Joint pain, muscle pain, and hair loss   Halcion [Triazolam] Other (See Comments)    Dizziness,headaches,bladder problems   Levaquin [Levofloxacin In D5w] Diarrhea and Itching    Shoulder pain   Metformin And Related Rash   Naproxen Sodium     Patient tolerates in small doses. Her reaction is swelling of the lower extremities.    Cefaclor Other (See Comments)    Doesn't remember---unsure if actually allergic    Sulfa Antibiotics Itching    Unsure of reaction possibly itching   Tramadol Itching and Nausea And Vomiting   Zinc Other (See Comments)    constipation  constipation    Aripiprazole Other (See Comments)    Muscle tension/cramping   Diclofenac Sodium Rash    "made very sick"    Metabolic Disorder Labs: Lab Results  Component Value  Date   HGBA1C 6.2 (A) 12/03/2022   MPG 142.72 07/02/2021   No results found for: "PROLACTIN" Lab Results  Component Value Date   CHOL 146 08/20/2022   TRIG 196.0 (H) 08/20/2022   HDL 40.50 08/20/2022   CHOLHDL 4 08/20/2022   VLDL 39.2 08/20/2022   LDLCALC 67 08/20/2022   LDLCALC 81 03/10/2021     Current Medications: Current Outpatient Medications  Medication Sig Dispense Refill   aspirin EC 81 MG tablet Take 81 mg by mouth at bedtime. Swallow whole.     Azelastine HCl 137 MCG/SPRAY SOLN PLACE 2 SPRAYS INTO BOTH NOSTRILS 2 (TWO) TIMES DAILY 30 mL 5   B Complex-Biotin-FA (BIG 100, BIOTIN, PO) Take 300 mg by mouth at bedtime.     cetirizine (ZYRTEC) 10 MG tablet Take 10 mg by mouth 2 (two) times daily.     clindamycin (CLEOCIN T) 1 % external solution Apply to scalp once or twice a day prn bumps 30 mL 2   Continuous Blood Gluc Sensor (FREESTYLE LIBRE 3 SENSOR) MISC PLACE 1 SENSOR ON THE SKIN EVERY 14 DAYS. USE TO CHECK GLUCOSE CONTINUOUSLY 6 each 1   cyanocobalamin (VITAMIN B12) 1000 MCG/ML injection INJECT 1 ML (1,000 MCG TOTAL) INTO THE MUSCLE EVERY 30 (THIRTY) DAYS. FOR B12 VITAMIN 3 mL 0   desipramine (NORPRAMIN) 25 MG tablet 2  qam 60 tablet 6   diphenhydrAMINE (BENADRYL) 2 % cream Apply topically 3 (three) times daily as needed for itching.     DULoxetine HCl 40 MG CPEP Take 0.5 capsules (20 mg total) by mouth 2 (two) times daily. 2 q day 60 capsule 4   estradiol (ESTRACE) 0.1 MG/GM vaginal cream Estrogen Cream Instruction Discard applicator Apply pea sized amount to tip of finger to urethra before bed. Wash hands well after application. Use Monday, Wednesday and Friday 42.5 g 11   Evolocumab (REPATHA SURECLICK) 353 MG/ML SOAJ Inject 140 mg into the skin every 14 (fourteen) days. 2 mL 5   fenofibrate (TRICOR) 145 MG tablet Take 1 tablet (145 mg total) by mouth  daily. For cholesterol. Please call [phone] for an appointment. 90 tablet 3   glipiZIDE (GLUCOTROL XL) 5 MG 24 hr  tablet Take 1 tablet (5 mg total) by mouth daily with breakfast. for diabetes. 90 tablet 0   hydrOXYzine (VISTARIL) 50 MG capsule Take 2 capsules (100 mg total) by mouth at bedtime. TAKE 1 CAPSLE BY MOUTH AT NOON, 1 AT 6 PM, AND 1 AS NEEDED 90 capsule 3   ibuprofen (ADVIL) 200 MG tablet Take 400 mg by mouth 2 (two) times daily.     ketoconazole (NIZORAL) 2 % shampoo Apply 1 Application topically as directed. Wash from waist up 3 times a week for 2 months, then decrease to 1 time a month, let sit 5 minutes and rinse off (Patient not taking: Reported on 12/03/2022) 120 mL 6   lamoTRIgine (LAMICTAL) 150 MG tablet Take 2 tablets (300 mg total) by mouth daily at bedtime. 60 tablet 6   levalbuterol (XOPENEX HFA) 45 MCG/ACT inhaler Inhale 2 puffs into the lungs every 4 (four) hours as needed for wheezing.     Lysine 500 MG TABS Take 1,000 mg by mouth daily.     methocarbamol (ROBAXIN) 500 MG tablet TAKE 1-2 TABLETS (500-1,000 MG TOTAL) BY MOUTH AT BEDTIME AS NEEDED FOR MUSCLE SPASMS. 180 tablet 0   metoprolol tartrate (LOPRESSOR) 50 MG tablet Take 1 tablet (50 mg total) by mouth 2 (two) times daily. 180 tablet 3   mupirocin ointment (BACTROBAN) 2 % Apply 1 Application topically daily. Qd to wound on finger 22 g 1   neomycin-bacitracin-polymyxin (NEOSPORIN) [phone] ointment Apply 1 application topically 4 (four) times daily as needed (for cut/scrapes.).     pantoprazole (PROTONIX) 40 MG tablet TAKE 1 TABLET (40 MG TOTAL) BY MOUTH 2 (TWO) TIMES DAILY BEFORE A MEAL. FOR HEARTBURN 180 tablet 3   polyvinyl alcohol (LIQUIFILM TEARS) 1.4 % ophthalmic solution Place 2 drops into both eyes at bedtime.     Ruxolitinib Phosphate (OPZELURA) 1.5 % CREA Apply topically.     Semaglutide, 1 MG/DOSE, 4 MG/3ML SOPN Inject 1 mg as directed once a week. for diabetes. 9 mL 0   STIOLTO RESPIMAT 2.5-2.5 MCG/ACT AERS Inhale 2 puffs into the lungs at bedtime.     temazepam (RESTORIL) 30 MG capsule TAKE ONE TO TWO CAPSULES BY  MOUTH NIGHTLY AT BEDTIME 60 capsule 5   traZODone (DESYREL) 50 MG tablet Take 2 tablets (100 mg total) by mouth at bedtime. 60 tablet 7   trimethoprim (TRIMPEX) 100 MG tablet Take 1 tablet (100 mg total) by mouth daily. 30 tablet 11   valACYclovir (VALTREX) 1000 MG tablet Take 2 tablets by mouth twice daily for 1 day as needed for herpes outbreak. 12 tablet 0   Vitamin D3 (VITAMIN D) 25 MCG tablet Take 3,000 Units by mouth daily.     No current facility-administered medications for this visit.    Neurologic: Headache: No Seizure: No Paresthesias:No  Musculoskeletal: Strength & Muscle Tone: within normal limits Gait & Station: normal Patient leans: N/A  Psychiatric Specialty Exam: ROS  Last menstrual period 08/23/2014.There is no height or weight on file to calculate BMI.  General Appearance: Casual  Eye Contact:  Good  Speech:  Clear and Coherent  Volume:  Normal  Mood:  Negative  Affect:  Appropriate  Thought Process:  Goal Directed  Orientation:  NA  Thought Content:  Logical  Suicidal Thoughts:  No  Homicidal Thoughts:  No  Memory:  Negative  Judgement:  Good  Insight:  Good  Psychomotor Activity:  Normal  Concentration:    Recall:    Fund of Knowledge:Good  Language: Good  Akathisia:  No  Handed:  Right  AIMS (if indicated):    Assets:  Desire for Improvement  ADL's:  Intact  Cognition: WNL  Sleep:      1/23/20243:00 PM    Today the patient is at her baseline for the most part.  Her mood is fairly even.  Her diagnosis is bipolar disorder.  She continues taking Lamictal 300 mg.  She takes Cymbalta 40 mg and desipramine for depression.  She has insomnia and is treated with Restoril 60 mg trazodone and a CPAP machine.  She also takes 2 Vistaril's.  At this time I think these medicines are helpful.  She is not suicidal.  She is functioning fairly well.  She will return to see me in 4 months.Marland Kitchen

## 2022-12-15 ENCOUNTER — Inpatient Hospital Stay: Payer: PPO | Attending: Oncology

## 2022-12-15 ENCOUNTER — Inpatient Hospital Stay (HOSPITAL_BASED_OUTPATIENT_CLINIC_OR_DEPARTMENT_OTHER): Payer: PPO | Admitting: Nurse Practitioner

## 2022-12-15 ENCOUNTER — Encounter: Payer: Self-pay | Admitting: Nurse Practitioner

## 2022-12-15 VITALS — BP 111/49 | HR 88 | Temp 96.8°F | Wt 244.0 lb

## 2022-12-15 DIAGNOSIS — D509 Iron deficiency anemia, unspecified: Secondary | ICD-10-CM | POA: Diagnosis not present

## 2022-12-15 DIAGNOSIS — R5383 Other fatigue: Secondary | ICD-10-CM | POA: Insufficient documentation

## 2022-12-15 DIAGNOSIS — Z809 Family history of malignant neoplasm, unspecified: Secondary | ICD-10-CM | POA: Diagnosis not present

## 2022-12-15 DIAGNOSIS — E119 Type 2 diabetes mellitus without complications: Secondary | ICD-10-CM | POA: Insufficient documentation

## 2022-12-15 DIAGNOSIS — G35 Multiple sclerosis: Secondary | ICD-10-CM | POA: Diagnosis not present

## 2022-12-15 DIAGNOSIS — Z87442 Personal history of urinary calculi: Secondary | ICD-10-CM | POA: Diagnosis not present

## 2022-12-15 DIAGNOSIS — Z885 Allergy status to narcotic agent status: Secondary | ICD-10-CM | POA: Insufficient documentation

## 2022-12-15 DIAGNOSIS — F1290 Cannabis use, unspecified, uncomplicated: Secondary | ICD-10-CM | POA: Insufficient documentation

## 2022-12-15 DIAGNOSIS — R531 Weakness: Secondary | ICD-10-CM | POA: Insufficient documentation

## 2022-12-15 DIAGNOSIS — Z882 Allergy status to sulfonamides status: Secondary | ICD-10-CM | POA: Insufficient documentation

## 2022-12-15 DIAGNOSIS — D75839 Thrombocytosis, unspecified: Secondary | ICD-10-CM | POA: Diagnosis not present

## 2022-12-15 DIAGNOSIS — Z800 Family history of malignant neoplasm of digestive organs: Secondary | ICD-10-CM | POA: Insufficient documentation

## 2022-12-15 DIAGNOSIS — Z79899 Other long term (current) drug therapy: Secondary | ICD-10-CM | POA: Diagnosis not present

## 2022-12-15 DIAGNOSIS — K219 Gastro-esophageal reflux disease without esophagitis: Secondary | ICD-10-CM | POA: Insufficient documentation

## 2022-12-15 DIAGNOSIS — R0602 Shortness of breath: Secondary | ICD-10-CM | POA: Insufficient documentation

## 2022-12-15 DIAGNOSIS — F319 Bipolar disorder, unspecified: Secondary | ICD-10-CM | POA: Diagnosis not present

## 2022-12-15 DIAGNOSIS — R252 Cramp and spasm: Secondary | ICD-10-CM | POA: Insufficient documentation

## 2022-12-15 DIAGNOSIS — Z8249 Family history of ischemic heart disease and other diseases of the circulatory system: Secondary | ICD-10-CM | POA: Insufficient documentation

## 2022-12-15 DIAGNOSIS — G4730 Sleep apnea, unspecified: Secondary | ICD-10-CM | POA: Insufficient documentation

## 2022-12-15 DIAGNOSIS — Z881 Allergy status to other antibiotic agents status: Secondary | ICD-10-CM | POA: Insufficient documentation

## 2022-12-15 LAB — CBC WITH DIFFERENTIAL/PLATELET
Abs Immature Granulocytes: 0.16 10*3/uL — ABNORMAL HIGH (ref 0.00–0.07)
Basophils Absolute: 0.1 10*3/uL (ref 0.0–0.1)
Basophils Relative: 1 %
Eosinophils Absolute: 0.4 10*3/uL (ref 0.0–0.5)
Eosinophils Relative: 5 %
HCT: 39.9 % (ref 36.0–46.0)
Hemoglobin: 13.3 g/dL (ref 12.0–15.0)
Immature Granulocytes: 2 %
Lymphocytes Relative: 21 %
Lymphs Abs: 1.6 10*3/uL (ref 0.7–4.0)
MCH: 30.9 pg (ref 26.0–34.0)
MCHC: 33.3 g/dL (ref 30.0–36.0)
MCV: 92.8 fL (ref 80.0–100.0)
Monocytes Absolute: 0.5 10*3/uL (ref 0.1–1.0)
Monocytes Relative: 7 %
Neutro Abs: 4.8 10*3/uL (ref 1.7–7.7)
Neutrophils Relative %: 64 %
Platelets: 434 10*3/uL — ABNORMAL HIGH (ref 150–400)
RBC: 4.3 MIL/uL (ref 3.87–5.11)
RDW: 12.7 % (ref 11.5–15.5)
WBC: 7.5 10*3/uL (ref 4.0–10.5)
nRBC: 0 % (ref 0.0–0.2)

## 2022-12-15 LAB — FERRITIN: Ferritin: 60 ng/mL (ref 11–307)

## 2022-12-15 LAB — IRON AND TIBC
Iron: 64 ug/dL (ref 28–170)
Saturation Ratios: 14 % (ref 10.4–31.8)
TIBC: 470 ug/dL — ABNORMAL HIGH (ref 250–450)
UIBC: 406 ug/dL

## 2022-12-15 MED FILL — Iron Sucrose Inj 20 MG/ML (Fe Equiv): INTRAVENOUS | Qty: 10 | Status: AC

## 2022-12-15 NOTE — Progress Notes (Signed)
[place]  [phone] [phone]  ID: [name] OB: Dec 29, 1960  MR#: [id number]  EHO#:[id number]  Patient Care Team: [name], NP as PCP - General (Internal Medicine) [name] Sable, MD as PCP - Cardiology (Cardiology) Birder Robson, MD as Referring Physician (Ophthalmology) [name], [facility] as Pharmacist (Pharmacist) Lloyd Huger, MD as Consulting Physician (Hematology and Oncology)  CHIEF COMPLAINT: Iron deficiency anemia  INTERVAL HISTORY: Patient returns to clinic today for repeat laboratory work, further evaluation, and continuation of Venofer if needed. No dizziness or falls. No recent fevers or illness. Has chronic balance and weakness due to MS. Has leg and foot cramping, particularly at night. Has chronic shortness of breath. Denies chest pain. No nausea, vomiting, black or bloody stools. Denies vaginal bleeding.   REVIEW OF SYSTEMS:   Review of Systems  Constitutional:  Positive for malaise/fatigue. Negative for fever and weight loss.  Respiratory:  Positive for shortness of breath. Negative for cough and hemoptysis.   Cardiovascular:  Negative for chest pain and leg swelling.  Gastrointestinal:  Negative for abdominal pain, blood in stool and melena.  Genitourinary:  Negative for hematuria.  Musculoskeletal:  Negative for back pain and falls.  Skin:  Positive for itching and rash.  Neurological:  Positive for weakness. Negative for dizziness, focal weakness and headaches.  Psychiatric/Behavioral:  The patient is not nervous/anxious.   As per HPI. Otherwise, a complete review of systems is negative.  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   History of kidney stones    Hypercholesterolemia    IDA (iron deficiency anemia)    Multiple sclerosis (HCC)    weakness   Osteoporosis    osteoarthritis   Pneumonia    PONV (postoperative nausea and vomiting)    no problem after cataract surgery   Prediabetes 07/22/2018   Psoriasis    Shortness of breath dyspnea    wheezing   Sleep apnea 2012   sleep study / slight, no interventions   Urinary frequency    Weight gain 06/21/2014    PAST SURGICAL HISTORY: Past Surgical History:  Procedure Laterality Date   CATARACT EXTRACTION W/PHACO Left 05/21/2015   Procedure: CATARACT EXTRACTION PHACO AND INTRAOCULAR LENS PLACEMENT ([place]);  Surgeon: Birder Robson, MD;  Location: [facility];  Service: Ophthalmology;  Laterality: Left;  [place] 00:35 AP% 22.9 CDE 8.11 fluid pack lot #[id number]   CATARACT EXTRACTION W/PHACO Right 06/04/2015   Procedure: CATARACT EXTRACTION PHACO AND INTRAOCULAR LENS PLACEMENT ([facility]);  Surgeon: Birder Robson, MD;  Location: [facility];  Service: Ophthalmology;  Laterality: Right;  US:00:48 AP%: 10.5 CDE:5.08 Fluid lot #[id number]   CYSTOSCOPY/URETEROSCOPY/HOLMIUM LASER/STENT PLACEMENT Bilateral 09/22/2016   Procedure: CYSTOSCOPY/URETEROSCOPY/HOLMIUM LASER/STENT PLACEMENT;  Surgeon: Hollice Espy, MD;  Location: [facility];  Service: Urology;  Laterality: Bilateral;   EYE SURGERY  2015  tissue biopsy   FOOT SURGERY  2015   JOINT REPLACEMENT Left 2013   hip replacement   LITHOTRIPSY     PTOSIS REPAIR Bilateral 02/18/2016   Procedure: BILATERAL PTOSIS REPAIR UPPER EYELIDS;  Surgeon: Karle Starch, MD;  Location: [place];  Service: Ophthalmology;  Laterality: Bilateral;   LEAVE PT EARLY AM   thumb surgery Right    TONSILLECTOMY  1973   TOTAL HIP ARTHROPLASTY Right 07/02/2021   Procedure: TOTAL HIP ARTHROPLASTY ANTERIOR APPROACH;  Surgeon: Gaynelle Arabian, MD;  Location: [facility];  Service: Orthopedics;  Laterality: Right;    FAMILY HISTORY: Family History  Problem Relation Age of Onset   Cancer Father        Abdomen with mastasis   Cancer Mother    Heart disease Mother    Kidney disease Neg Hx    Bladder Cancer Neg Hx    Prostate cancer Neg Hx    Kidney cancer Neg Hx     ADVANCED DIRECTIVES (Y/N):  N  HEALTH MAINTENANCE: Social History   Tobacco Use   Smoking status: Former    Packs/day: 1.00    Years: 25.00    Total pack years: 25.00    Types: Cigarettes    Quit date: 2019    Years since quitting: 5.0    Passive exposure: Past   Smokeless tobacco: Never   Tobacco comments:    occasional use  Vaping Use   Vaping Use: Former  Substance Use Topics   Alcohol use: No    Alcohol/week: 0.0 standard drinks of alcohol   Drug use: Yes    Types: Methylphenidate, 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 Her reaction is swelling of the lower extremities.    Cefaclor Other (See Comments)    Doesn't remember---unsure if actually allergic    Sulfa Antibiotics Itching    Unsure of reaction possibly itching   Tramadol Itching and Nausea And Vomiting   Zinc Other (See Comments)    constipation  constipation    Aripiprazole Other (See  Comments)    Muscle tension/cramping   Diclofenac Sodium Rash    "made very sick"    Current Outpatient Medications  Medication Sig Dispense Refill   aspirin EC 81 MG tablet Take 81 mg by mouth at bedtime. Swallow whole.     Azelastine HCl 137 MCG/SPRAY SOLN PLACE 2 SPRAYS INTO BOTH NOSTRILS 2 (TWO) TIMES DAILY 30 mL 5   B Complex-Biotin-FA (BIG 100, BIOTIN, PO) Take 300 mg by mouth at bedtime.     cetirizine (ZYRTEC) 10 MG tablet Take 10 mg by mouth 2 (two) times daily.     clindamycin (CLEOCIN T) 1 % external solution Apply to scalp once or twice a day prn bumps 30 mL 2   Continuous Blood Gluc Sensor (FREESTYLE LIBRE 3 SENSOR) MISC PLACE 1 SENSOR ON THE SKIN EVERY 14 DAYS. USE TO CHECK GLUCOSE  CONTINUOUSLY 6 each 1   cyanocobalamin (VITAMIN B12) 1000 MCG/ML injection INJECT 1 ML (1,000 MCG TOTAL) INTO THE MUSCLE EVERY 30 (THIRTY) DAYS. FOR B12 VITAMIN 3 mL 0   desipramine (NORPRAMIN) 25 MG tablet 2  qam 60 tablet 6   diphenhydrAMINE (BENADRYL) 2 % cream Apply topically 3 (three) times daily as needed for itching.     DULoxetine HCl 40 MG CPEP Take 0.5 capsules (20 mg total) by mouth 2 (two) times daily. 2 q day 60 capsule 4   estradiol (ESTRACE) 0.1 MG/GM vaginal cream Estrogen Cream Instruction Discard applicator Apply pea sized amount to tip of finger to urethra before bed. Wash hands well after application. Use Monday, Wednesday and Friday 42.5 g 11   Evolocumab (REPATHA SURECLICK) 465 MG/ML SOAJ Inject 140 mg into the skin every 14 (fourteen) days. 2 mL 5   fenofibrate (TRICOR) 145 MG tablet Take 1 tablet (145 mg total) by mouth daily. For cholesterol. Please call [phone] for an appointment. 90 tablet 3   glipiZIDE (GLUCOTROL XL) 5 MG 24 hr tablet Take 1 tablet (5 mg total) by mouth daily with breakfast. for diabetes. 90 tablet 0   hydrOXYzine (VISTARIL) 50 MG capsule Take 2 capsules (100 mg total) by mouth at bedtime. TAKE 1 CAPSLE BY MOUTH AT NOON, 1 AT 6 PM, AND 1 AS NEEDED 90  capsule 3   ibuprofen (ADVIL) 200 MG tablet Take 400 mg by mouth 2 (two) times daily.     lamoTRIgine (LAMICTAL) 150 MG tablet Take 2 tablets (300 mg total) by mouth daily at bedtime. 60 tablet 6   levalbuterol (XOPENEX HFA) 45 MCG/ACT inhaler Inhale 2 puffs into the lungs every 4 (four) hours as needed for wheezing.     Lysine 500 MG TABS Take 1,000 mg by mouth daily.     methocarbamol (ROBAXIN) 500 MG tablet TAKE 1-2 TABLETS (500-1,000 MG TOTAL) BY MOUTH AT BEDTIME AS NEEDED FOR MUSCLE SPASMS. 180 tablet 0   metoprolol tartrate (LOPRESSOR) 50 MG tablet Take 1 tablet (50 mg total) by mouth 2 (two) times daily. 180 tablet 3   mupirocin ointment (BACTROBAN) 2 % Apply 1 Application topically daily. Qd to wound on finger 22 g 1   neomycin-bacitracin-polymyxin (NEOSPORIN) [phone] ointment Apply 1 application topically 4 (four) times daily as needed (for cut/scrapes.).     pantoprazole (PROTONIX) 40 MG tablet TAKE 1 TABLET (40 MG TOTAL) BY MOUTH 2 (TWO) TIMES DAILY BEFORE A MEAL. FOR HEARTBURN 180 tablet 3   polyvinyl alcohol (LIQUIFILM TEARS) 1.4 % ophthalmic solution Place 2 drops into both eyes at bedtime.     Ruxolitinib Phosphate (OPZELURA) 1.5 % CREA Apply topically.     Semaglutide, 1 MG/DOSE, 4 MG/3ML SOPN Inject 1 mg as directed once a week. for diabetes. 9 mL 0   STIOLTO RESPIMAT 2.5-2.5 MCG/ACT AERS Inhale 2 puffs into the lungs at bedtime.     temazepam (RESTORIL) 30 MG capsule TAKE ONE TO TWO CAPSULES BY MOUTH NIGHTLY AT BEDTIME 60 capsule 5   traZODone (DESYREL) 50 MG tablet Take 2 tablets (100 mg total) by mouth at bedtime. 60 tablet 7   trimethoprim (TRIMPEX) 100 MG tablet Take 1 tablet (100 mg total) by mouth daily. 30 tablet 11   valACYclovir (VALTREX) 1000 MG tablet Take 2 tablets by mouth twice daily for 1 day as needed for herpes outbreak. 12 tablet 0   Vitamin D3 (VITAMIN D) 25 MCG tablet Take 3,000 Units by mouth daily.  ketoconazole (NIZORAL) 2 % shampoo Apply 1  Application topically as directed. Wash from waist up 3 times a week for 2 months, then decrease to 1 time a month, let sit 5 minutes and rinse off (Patient not taking: Reported on 12/03/2022) 120 mL 6   No current facility-administered medications for this visit.    OBJECTIVE: Vitals:   12/15/22 1527  BP: (!) 111/49  Pulse: 88  Temp: (!) 96.8 F (36 C)  SpO2: 99%     Body mass index is 36.03 kg/m.    ECOG FS:0 - Asymptomatic  General: Well-developed, well-nourished, no acute distress. Carbon fiber red walker HEENT: Normocephalic, moist mucous membranes. Lungs: No audible wheezing or coughing. Heart: Regular rate and rhythm. Abdomen: Soft, nontender, no obvious distention. Musculoskeletal: No edema, cyanosis, or clubbing. Neuro: Alert, answering all questions appropriately.  Skin: No rashes or petechiae noted. Psych: Normal affect.  LAB RESULTS: Lab Results  Component Value Date   NA 141 08/20/2022   K 4.1 08/20/2022   CL 103 08/20/2022   CO2 28 08/20/2022   GLUCOSE 133 (H) 08/20/2022   BUN 16 08/20/2022   CREATININE 1.10 08/20/2022   CALCIUM 9.0 08/20/2022   PROT 6.4 08/20/2022   ALBUMIN 4.2 08/20/2022   AST 9 08/20/2022   ALT 10 08/20/2022   ALKPHOS 43 08/20/2022   BILITOT 0.3 08/20/2022   GFRNONAA >60 03/23/2022   GFRAA >60 02/19/2020   Lab Results  Component Value Date   WBC 7.5 12/15/2022   NEUTROABS 4.8 12/15/2022   HGB 13.3 12/15/2022   HCT 39.9 12/15/2022   MCV 92.8 12/15/2022   PLT 434 (H) 12/15/2022   Lab Results  Component Value Date   IRON 70 08/20/2022   TIBC 457.8 (H) 08/20/2022   IRONPCTSAT 15.3 (L) 08/20/2022   Lab Results  Component Value Date   FERRITIN 46.7 08/20/2022     STUDIES: No results found.  ASSESSMENT: Iron deficiency anemia.  PLAN:    Iron deficiency anemia: Etiology unclear. She declines GI workup including cologard. Denies black or bloody stools. Hemoglobin is normal at 13.3. No microcytosis. Ferritin and iron are  pending at time of visit. She is symptomatic but unclear if related to iron deficiency vs comorbidities. Hold venofer for now. She last received treatment on February 05, 2022.   Thrombocytosis: Improved.  Monitor.   Leukocytosis: Resolved.  Disposition:  6 mo- labs (cbc, ferritin, iron studies) Few days to week later- See Dr. Grayland Ormond or myself for follow up, +/- venofer- la  I spent a total of 20 minutes reviewing chart data, face-to-face evaluation with the patient, counseling and coordination of care as detailed above.  Patient expressed understanding and was in agreement with this plan. She also understands that She can call clinic at any time with any questions, concerns, or complaints.   Verlon Au, NP 12/15/2022

## 2022-12-16 ENCOUNTER — Inpatient Hospital Stay: Payer: PPO

## 2022-12-16 ENCOUNTER — Telehealth: Payer: Self-pay | Admitting: Oncology

## 2022-12-16 NOTE — Telephone Encounter (Signed)
pt called in to verify if Iron encounbter for today was needed. Per provider [place] pt does not need Iron INF for 1/31. pt has been notified

## 2022-12-23 ENCOUNTER — Other Ambulatory Visit: Payer: Self-pay | Admitting: Primary Care

## 2022-12-23 ENCOUNTER — Encounter: Payer: Self-pay | Admitting: Dermatology

## 2022-12-23 ENCOUNTER — Ambulatory Visit: Payer: PPO | Admitting: Dermatology

## 2022-12-23 VITALS — BP 113/76

## 2022-12-23 DIAGNOSIS — L42 Pityriasis rosea: Secondary | ICD-10-CM

## 2022-12-23 DIAGNOSIS — K1379 Other lesions of oral mucosa: Secondary | ICD-10-CM

## 2022-12-23 DIAGNOSIS — L509 Urticaria, unspecified: Secondary | ICD-10-CM | POA: Diagnosis not present

## 2022-12-23 DIAGNOSIS — Z79899 Other long term (current) drug therapy: Secondary | ICD-10-CM

## 2022-12-23 MED ORDER — MONTELUKAST SODIUM 10 MG PO TABS: 10.0000 mg | ORAL_TABLET | Freq: Every day | ORAL | 3 refills | Status: DC

## 2022-12-23 MED ORDER — VALACYCLOVIR HCL 1 G PO TABS: ORAL_TABLET | ORAL | 0 refills | Status: AC

## 2022-12-23 NOTE — Patient Instructions (Signed)
Gyrate erythema    Due to recent changes in healthcare laws, you may see results of your pathology and/or laboratory studies on MyChart before the doctors have had a chance to review them. We understand that in some cases there may be results that are confusing or concerning to you. Please understand that not all results are received at the same time and often the doctors may need to interpret multiple results in order to provide you with the best plan of care or course of treatment. Therefore, we ask that you please give [place] 2 business days to thoroughly review all your results before contacting the office for clarification. Should we see a critical lab result, you will be contacted sooner.   If You Need Anything After Your Visit  If you have any questions or concerns for your doctor, please call our main line at [phone] and press option 4 to reach your doctor's medical assistant. If no one answers, please leave a voicemail as directed and we will return your call as soon as possible. Messages left after 4 pm will be answered the following business day.   You may also send [place] a message via [place]. We typically respond to MyChart messages within 1-2 business days.  For prescription refills, please ask your pharmacy to contact our office. Our fax number is [phone].  If you have an urgent issue when the clinic is closed that cannot wait until the next business day, you can page your doctor at the number below.    Please note that while we do our best to be available for urgent issues outside of office hours, we are not available 24/7.   If you have an urgent issue and are unable to reach [place], you may choose to seek medical care at your doctor's office, retail clinic, urgent care center, or emergency room.  If you have a medical emergency, please immediately call 911 or go to the emergency department.  Pager Numbers  - Dr. Nehemiah Massed: [phone]  - Dr. Laurence Ferrari: [phone]  - Dr.  Nicole Kindred: [phone]  In the event of inclement weather, please call our main line at [phone] for an update on the status of any delays or closures.  Dermatology Medication Tips: Please keep the boxes that topical medications come in in order to help keep track of the instructions about where and how to use these. Pharmacies typically print the medication instructions only on the boxes and not directly on the medication tubes.   If your medication is too expensive, please contact our office at [phone] option 4 or send [place] a message through [place].   We are unable to tell what your co-pay for medications will be in advance as this is different depending on your insurance coverage. However, we may be able to find a substitute medication at lower cost or fill out paperwork to get insurance to cover a needed medication.   If a prior authorization is required to get your medication covered by your insurance company, please allow [place] 1-2 business days to complete this process.  Drug prices often vary depending on where the prescription is filled and some pharmacies may offer cheaper prices.  The website www.goodrx.com contains coupons for medications through different pharmacies. The prices here do not account for what the cost may be with help from insurance (it may be cheaper with your insurance), but the website can give you the price if you did not use any insurance.  - You can print the associated  coupon and take it with your prescription to the pharmacy.  - You may also stop by our office during regular business hours and pick up a GoodRx coupon card.  - If you need your prescription sent electronically to a different pharmacy, notify our office through [facility] or by phone at [phone] option 4.     Si Usted Necesita Algo Despus de Su Visita  Tambin puede enviarnos un mensaje a travs de Pharmacist, community. Por lo general respondemos a los mensajes de MyChart en el transcurso  de 1 a 2 das hbiles.  Para renovar recetas, por favor pida a su farmacia que se ponga en contacto con nuestra oficina. Harland Dingwall de fax es [place] [phone].  Si tiene un asunto urgente cuando la clnica est cerrada y que no puede esperar hasta el siguiente da hbil, puede llamar/localizar a su doctor(a) al nmero que aparece a continuacin.   Por favor, tenga en cuenta que aunque hacemos todo lo posible para estar disponibles para asuntos urgentes fuera del horario de [place], no estamos disponibles las 24 horas del da, los 7 das de la [place].   Si tiene un problema urgente y no puede comunicarse con nosotros, puede optar por buscar atencin mdica  en el consultorio de su doctor(a), en una clnica privada, en un centro de atencin urgente o en una sala de emergencias.  Si tiene Engineering geologist, por favor llame inmediatamente al 911 o vaya a la sala de emergencias.  Nmeros de bper  - Dr. Nehemiah Massed: [phone]  - Dra. Moye: [phone]  - Dra. Nicole Kindred: [phone]  En caso de inclemencias del [place], por favor llame a Johnsie Kindred principal al [phone] para una actualizacin sobre el [place] de cualquier retraso o cierre.  Consejos para la medicacin en dermatologa: Por favor, guarde las cajas en las que vienen los medicamentos de uso tpico para ayudarle a seguir las instrucciones sobre dnde y cmo usarlos. Las farmacias generalmente imprimen las instrucciones del medicamento slo en las cajas y no directamente en los tubos del [place].   Si su medicamento es muy caro, por favor, pngase en contacto con Zigmund Daniel llamando al [phone] y presione la opcin 4 o envenos un mensaje a travs de Pharmacist, community.   No podemos decirle cul ser su copago por los medicamentos por adelantado ya que esto es diferente dependiendo de la cobertura de su seguro. Sin embargo, es posible que podamos encontrar un medicamento sustituto a Electrical engineer un formulario  para que el seguro cubra el medicamento que se considera necesario.   Si se requiere una autorizacin previa para que su compaa de seguros [place] su medicamento, por favor permtanos de 1 a 2 das hbiles para completar este proceso.  Los precios de los medicamentos varan con frecuencia dependiendo del Environmental consultant de dnde se surte la receta y alguna farmacias pueden ofrecer precios ms baratos.  El sitio web www.goodrx.com tiene cupones para medicamentos de Airline pilot. Los precios aqu no tienen en cuenta lo que podra costar con la ayuda del seguro (puede ser ms barato con su seguro), pero el sitio web puede darle el precio si no utiliz Research scientist (physical sciences).  - Puede imprimir el cupn correspondiente y llevarlo con su receta a la farmacia.  - Tambin puede pasar por nuestra oficina durante el horario de atencin regular y Charity fundraiser una tarjeta de cupones de GoodRx.  - Si necesita que su receta se enve electrnicamente a Chiropodist, informe a nuestra oficina a travs  [place] o por telfono llamando al [phone] y presione la opcin 4.

## 2022-12-23 NOTE — Progress Notes (Signed)
   Follow-Up Visit   Subjective  Emily Moon is a 62 y.o. female who presents for the following: Other (Urticaria follow up - areas keep popping up. Antihistamine bid - Opzelura was too expensive).  The following portions of the chart were reviewed this encounter and updated as appropriate:   Tobacco  Allergies  Meds  Problems  Med Hx  Surg Hx  Fam Hx     Review of Systems:  No other skin or systemic complaints except as noted in HPI or Assessment and Plan.  Objective  Well appearing patient in no apparent distress; mood and affect are within normal limits.  A focused examination was performed including face, arms, legs. Relevant physical exam findings are noted in the Assessment and Plan.          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  Start Singulair 10 mg 1 po qd May consider Xolair injections in the future if not improving  Chronic and persistent condition with duration or expected duration over one year. Condition is symptomatic / bothersome to patient. Not to goal.  montelukast (SINGULAIR) 10 MG tablet Take 1 tablet (10 mg total) by mouth at bedtime.  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 Condition is symptomatic / bothersome to patient. Not to goal.  Return in about 6 weeks (around 02/03/2023) for Follow up.  I, Ashok Cordia, CMA, am acting as scribe for Sarina Ser, MD . Documentation: I have reviewed the above documentation for accuracy and completeness, and I agree with the above.  Sarina Ser, MD

## 2022-12-30 ENCOUNTER — Other Ambulatory Visit: Payer: Self-pay | Admitting: Primary Care

## 2022-12-30 DIAGNOSIS — J3089 Other allergic rhinitis: Secondary | ICD-10-CM

## 2022-12-31 ENCOUNTER — Other Ambulatory Visit (HOSPITAL_COMMUNITY): Payer: Self-pay | Admitting: Psychiatry

## 2023-01-01 DIAGNOSIS — G4733 Obstructive sleep apnea (adult) (pediatric): Secondary | ICD-10-CM | POA: Diagnosis not present

## 2023-01-04 ENCOUNTER — Telehealth: Payer: Self-pay

## 2023-01-04 NOTE — Telephone Encounter (Signed)
Per notes patient made an appointment on 01/06/23 to follow up

## 2023-01-04 NOTE — Telephone Encounter (Signed)
Received fax from access nurse stating patient is not able to pass urine, symptoms started last week. Per Fax patient was told to go to ER. No notes in the chart. I left a message for patient to call us back to let us know how she is feeling and if she seeked  treatment over the weekend.

## 2023-01-05 DIAGNOSIS — R200 Anesthesia of skin: Secondary | ICD-10-CM | POA: Diagnosis not present

## 2023-01-05 DIAGNOSIS — R531 Weakness: Secondary | ICD-10-CM | POA: Diagnosis not present

## 2023-01-05 DIAGNOSIS — G35 Multiple sclerosis: Secondary | ICD-10-CM | POA: Diagnosis not present

## 2023-01-05 DIAGNOSIS — R413 Other amnesia: Secondary | ICD-10-CM | POA: Diagnosis not present

## 2023-01-05 DIAGNOSIS — R5383 Other fatigue: Secondary | ICD-10-CM | POA: Diagnosis not present

## 2023-01-05 NOTE — Progress Notes (Signed)
01/06/2023 5:49 PM   Emily Moon 14-Dec-1960 [id number]  Referring provider: [name], NP [place],  [place] [address]  Urological history: 1.  Nephrolithiasis -Bilateral URS (09/2016)  2. OAB -Contributing factors of multiple sclerosis, hypertension, sleep apnea (OSA), COPD, diabetes, obesity, depression, anxiety, memory issues, antihistamines and vaginal atrophy -PVR 0 mL   3. High risk hematuria -former smoker -contrast CT (03/2022) pyelonephritis -cysto (07/2022) NED  -no reports of gross heme -UA 3-10 RBC's  Chief Complaint  Patient presents with   Hematuria    HPI: Emily Moon is a 62 y.o. female who presents today for having trouble passing urine.  On Saturday, she couldn't pass urine.  She took 4 trimethoprim because she didn't know what else to do.  Patient denies any modifying or aggravating factors.  Patient denies any gross hematuria, dysuria or suprapubic/flank pain.  Patient denies any fevers, chills, nausea or vomiting.    UA yellow slightly cloudy, specific gravity 1.015, trace blood, pH 6.0, 3+ leukocyte, greater than 30 WBCs, 3-10 RBCs, 0-2 epithelial cells and moderate bacteria.  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   History of kidney stones    Hypercholesterolemia    IDA (iron deficiency anemia)    Multiple sclerosis (HCC)    weakness   Osteoporosis    osteoarthritis    Pneumonia    PONV (postoperative nausea and vomiting)    no problem after cataract surgery   Prediabetes 07/22/2018   Psoriasis    Shortness of breath dyspnea    wheezing   Sleep apnea 2012   sleep study / slight, no interventions   Urinary frequency    Weight gain 06/21/2014    Surgical History: Past Surgical History:  Procedure Laterality Date   CATARACT EXTRACTION W/PHACO Left 05/21/2015   Procedure: CATARACT EXTRACTION PHACO AND INTRAOCULAR LENS PLACEMENT ([place]);  Surgeon: Birder Robson, MD;  Location: [facility];  Service: Ophthalmology;  Laterality: Left;  [place] 00:35 AP% 22.9 CDE 8.11 fluid pack lot [id number]   CATARACT EXTRACTION W/PHACO Right 06/04/2015   Procedure: CATARACT EXTRACTION PHACO AND INTRAOCULAR LENS PLACEMENT (IOC);  Surgeon: Birder Robson, MD;  Location: [facility];  Service: Ophthalmology;  Laterality: Right;  US:00:48 AP%: 10.5 CDE:5.08 Fluid lot [id number]   CYSTOSCOPY/URETEROSCOPY/HOLMIUM LASER/STENT PLACEMENT Bilateral 09/22/2016   Procedure: CYSTOSCOPY/URETEROSCOPY/HOLMIUM LASER/STENT PLACEMENT;  Surgeon: Hollice Espy, MD;  Location: [facility];  Service: Urology;  Laterality: Bilateral;   EYE SURGERY  2015   tissue biopsy   FOOT SURGERY  2015   JOINT REPLACEMENT Left 2013   hip replacement   LITHOTRIPSY     PTOSIS REPAIR Bilateral 02/18/2016   Procedure: BILATERAL PTOSIS REPAIR UPPER EYELIDS;  Surgeon: Karle Starch, MD;  Location: [facility];  Service: Ophthalmology;  Laterality: Bilateral;  LEAVE PT  EARLY AM   thumb surgery Right    TONSILLECTOMY  1973   TOTAL HIP ARTHROPLASTY Right 07/02/2021   Procedure: TOTAL HIP ARTHROPLASTY ANTERIOR APPROACH;  Surgeon: Gaynelle Arabian, MD;  Location: [facility];  Service: Orthopedics;  Laterality: Right;    Home Medications:  Allergies as of 01/06/2023       Reactions   Albuterol Shortness Of Breath, Other (See Comments)   Makes pt feel jittery/ tacycardic   Crestor [rosuvastatin] Other (See Comments)    Joint pain, muscle pain, and hair loss   Halcion [triazolam] Other (See Comments)   Dizziness,headaches,bladder problems   Levaquin [levofloxacin In D5w] Diarrhea, Itching   Shoulder pain   Metformin And Related Rash   Naproxen Sodium    Patient tolerates in small doses. Her reaction is swelling of the lower extremities.    Cefaclor Other (See Comments)   Doesn't remember---unsure if actually allergic    Sulfa Antibiotics Itching   Unsure of reaction possibly itching   Tramadol Itching, Nausea And Vomiting   Zinc Other (See Comments)   constipation  constipation    Aripiprazole Other (See Comments)   Muscle tension/cramping   Diclofenac Sodium Rash   "made very sick"        Medication List        Accurate as of January 06, 2023 11:59 PM. If you have any questions, ask your nurse or doctor.          aspirin EC 81 MG tablet Take 81 mg by mouth at bedtime. Swallow whole.   Azelastine HCl 137 MCG/SPRAY Soln PLACE 2 SPRAYS INTO BOTH NOSTRILS 2 (TWO) TIMES DAILY   BIG 100 (BIOTIN) PO Take 300 mg by mouth at bedtime.   cetirizine 10 MG tablet Commonly known as: ZYRTEC Take 10 mg by mouth 2 (two) times daily.   clindamycin 1 % external solution Commonly known as: CLEOCIN T Apply to scalp once or twice a day prn bumps   cyanocobalamin 1000 MCG/ML injection Commonly known as: VITAMIN B12 INJECT 1 ML (1,000 MCG TOTAL) INTO THE MUSCLE EVERY 30 (THIRTY) DAYS. FOR B12 VITAMIN   desipramine 25 MG tablet Commonly known as: NORPRAMIN 2  qam   diphenhydrAMINE 2 % cream Commonly known as: BENADRYL Apply topically 3 (three) times daily as needed for itching.   DULoxetine HCl 40 MG Cpep Take 0.5 capsules (20 mg total) by mouth 2 (two) times daily. 2 q day   estradiol 0.1 MG/GM vaginal cream Commonly known as: ESTRACE Estrogen Cream Instruction Discard applicator Apply pea sized amount to tip of finger to urethra before bed. Wash hands well after application. Use  Monday, Wednesday and Friday   fenofibrate 145 MG tablet Commonly known as: TRICOR Take 1 tablet (145 mg total) by mouth daily. For cholesterol. Please call [phone] for an appointment.   FreeStyle Libre 3 Sensor Misc PLACE 1 SENSOR ON THE SKIN EVERY 14 DAYS. USE TO CHECK GLUCOSE CONTINUOUSLY   glipiZIDE 5 MG 24 hr tablet Commonly known as: GLUCOTROL XL Take 1 tablet (5 mg total) by mouth daily with breakfast. for diabetes.   hydrOXYzine 50 MG capsule Commonly known as: VISTARIL Take 2 capsules (100 mg total) by mouth at bedtime. TAKE 1 CAPSLE BY MOUTH AT NOON, 1 AT 6 PM, AND 1 AS NEEDED   ibuprofen 200 MG tablet Commonly known as: ADVIL Take 400 mg by mouth 2 (two) times daily.   ketoconazole 2 % shampoo Commonly known as: NIZORAL Apply 1 Application topically as  directed. Wash from waist up 3 times a week for 2 months, then decrease to 1 time a month, let sit 5 minutes and rinse off   lamoTRIgine 150 MG tablet Commonly known as: LAMICTAL Take 2 tablets (300 mg total) by mouth daily at bedtime.   levalbuterol 45 MCG/ACT inhaler Commonly known as: XOPENEX HFA Inhale 2 puffs into the lungs every 4 (four) hours as needed for wheezing.   Lysine 500 MG Tabs Take 1,000 mg by mouth daily.   methocarbamol 500 MG tablet Commonly known as: ROBAXIN TAKE 1-2 TABLETS (500-1,000 MG TOTAL) BY MOUTH AT BEDTIME AS NEEDED FOR MUSCLE SPASMS.   metoprolol tartrate 50 MG tablet Commonly known as: LOPRESSOR Take 1 tablet (50 mg total) by mouth 2 (two) times daily.   montelukast 10 MG tablet Commonly known as: Singulair Take 1 tablet (10 mg total) by mouth at bedtime.   mupirocin ointment 2 % Commonly known as: BACTROBAN Apply 1 Application topically daily. Qd to wound on finger   neomycin-bacitracin-polymyxin [phone] ointment Apply 1 application topically 4 (four) times daily as needed (for cut/scrapes.).   nitrofurantoin (macrocrystal-monohydrate) 100 MG capsule Commonly  known as: MACROBID Take 1 capsule (100 mg total) by mouth every 12 (twelve) hours. Started by: Zara Council, PA-C   Opzelura 1.5 % Crea Generic drug: Ruxolitinib Phosphate Apply topically.   pantoprazole 40 MG tablet Commonly known as: PROTONIX TAKE 1 TABLET (40 MG TOTAL) BY MOUTH 2 (TWO) TIMES DAILY BEFORE A MEAL. FOR HEARTBURN   polyvinyl alcohol 1.4 % ophthalmic solution Commonly known as: LIQUIFILM TEARS Place 2 drops into both eyes at bedtime.   Repatha SureClick [id number] MG/ML [place] Generic drug: Evolocumab Inject 140 mg into the skin every 14 (fourteen) days.   Semaglutide (1 MG/DOSE) 4 MG/3ML Sopn Inject 1 mg as directed once a week. for diabetes.   Stiolto Respimat 2.5-2.5 MCG/ACT Aers Generic drug: Tiotropium Bromide-Olodaterol Inhale 2 puffs into the lungs at bedtime.   temazepam 30 MG capsule Commonly known as: RESTORIL TAKE ONE TO TWO CAPSULES BY MOUTH NIGHTLY AT BEDTIME   traZODone 50 MG tablet Commonly known as: DESYREL Take 2 tablets (100 mg total) by mouth at bedtime.   trimethoprim 100 MG tablet Commonly known as: TRIMPEX Take 1 tablet (100 mg total) by mouth daily.   valACYclovir 1000 MG tablet Commonly known as: VALTREX Take 2 tablets by mouth twice daily for 1 day as needed for herpes outbreak.   vitamin D3 25 MCG (1000 UT) tablet Generic drug: Cholecalciferol Take 3,000 Units by mouth daily.        Allergies:  Allergies  Allergen Reactions   Albuterol Shortness Of Breath and Other (See Comments)    Makes pt feel jittery/ tacycardic   Crestor [Rosuvastatin] Other (See Comments)    Joint pain, muscle pain, and hair loss   Halcion [Triazolam] Other (See Comments)    Dizziness,headaches,bladder problems   Levaquin [Levofloxacin In D5w] Diarrhea and Itching    Shoulder pain   Metformin And Related Rash   Naproxen Sodium     Patient tolerates in small doses. Her reaction is swelling of the lower extremities.    Ozempic (0.25 Or 0.5  Mg-Dose) [Semaglutide(0.25 Or 0.'5mg'$ -Dos)] Itching   Cefaclor Other (See Comments)    Doesn't remember---unsure if actually allergic    Sulfa Antibiotics Itching    Unsure of reaction possibly itching   Tramadol Itching and Nausea And Vomiting   Zinc Other (See Comments)    constipation  constipation  Aripiprazole Other (See Comments)    Muscle tension/cramping   Diclofenac Sodium Rash    "made very sick"    Family History: Family History  Problem Relation Age of Onset   Cancer Father        Abdomen with mastasis   Cancer Mother    Heart disease Mother    Kidney disease Neg Hx    Bladder Cancer Neg Hx    Prostate cancer Neg Hx    Kidney cancer Neg Hx     Social History:  reports that she quit smoking about 5 years ago. Her smoking use included cigarettes. She has a 25.00 pack-year smoking history. She has been exposed to tobacco smoke. She has never used smokeless tobacco. She reports current drug use. Drugs: Methylphenidate and Marijuana. She reports that she does not drink alcohol.  ROS: Pertinent ROS in HPI  Physical Exam: BP 108/60   Pulse 72   Ht '5\' 9"'$  (1.753 m)   Wt 235 lb (106.6 kg)   LMP 08/23/2014   BMI 34.70 kg/m   Constitutional:  Well nourished. Alert and oriented, No acute distress. HEENT: [place] AT, moist mucus membranes.  Trachea midline Cardiovascular: No clubbing, cyanosis, or edema. Respiratory: Normal respiratory effort, no increased work of breathing. Neurologic: Grossly intact, no focal deficits, moving all 4 extremities. Psychiatric: Normal mood and affect.    Laboratory Data: Lab Results  Component Value Date   WBC 7.5 12/15/2022   HGB 13.3 12/15/2022   HCT 39.9 12/15/2022   MCV 92.8 12/15/2022   PLT 434 (H) 12/15/2022    Lab Results  Component Value Date   CREATININE 1.10 08/20/2022    Lab Results  Component Value Date   HGBA1C 6.2 (A) 12/03/2022       Component Value Date/Time   CHOL 146 08/20/2022 1508   HDL 40.50  08/20/2022 1508   CHOLHDL 4 08/20/2022 1508   VLDL 39.2 08/20/2022 1508   LDLCALC 67 08/20/2022 1508    Lab Results  Component Value Date   AST 9 08/20/2022   Lab Results  Component Value Date   ALT 10 08/20/2022    Urinalysis See EPIC and HPI I have reviewed the labs.   Pertinent Imaging:  01/06/23 14:42  Scan Result 0    Assessment & Plan:    1. Feelings of incomplete bladder emptying -UA grossly infected -urine culture -PVR 0 mL  2. Suspected UTI -UA is grossly infected and sent for culture -Her feelings of incomplete bladder emptying are likely due to UTI -Macrobid 100 mg twice daily sent to pharmacy, we will adjust once urine culture results are available -UA also had microscopic hematuria, so we need to recheck after UTI is treated to ensure micro heme resolves  Return for pending urine culture results .  These notes generated with voice recognition software. I apologize for typographical errors.  [place], [place] [address]  [place], [place] [address] [phone]

## 2023-01-06 ENCOUNTER — Encounter: Payer: Self-pay | Admitting: Urology

## 2023-01-06 ENCOUNTER — Ambulatory Visit: Payer: PPO | Admitting: Urology

## 2023-01-06 VITALS — BP 108/60 | HR 72 | Ht 69.0 in | Wt 235.0 lb

## 2023-01-06 DIAGNOSIS — R3914 Feeling of incomplete bladder emptying: Secondary | ICD-10-CM

## 2023-01-06 LAB — MICROSCOPIC EXAMINATION: WBC, UA: >30 /hpf — AB (ref 0–5)

## 2023-01-06 LAB — URINALYSIS, COMPLETE
Bilirubin, UA: NEGATIVE
Glucose, UA: NEGATIVE
Ketones, UA: NEGATIVE
Nitrite, UA: NEGATIVE
Protein,UA: NEGATIVE
Specific Gravity, UA: 1.015 (ref 1.005–1.030)
Urobilinogen, Ur: 0.2 mg/dL (ref 0.2–1.0)
pH, UA: 6 (ref 5.0–7.5)

## 2023-01-06 LAB — BLADDER SCAN AMB NON-IMAGING: Scan Result: 0

## 2023-01-06 MED ORDER — NITROFURANTOIN MONOHYD MACRO 100 MG PO CAPS: 100.0000 mg | ORAL_CAPSULE | Freq: Two times a day (BID) | ORAL | 0 refills | Status: DC

## 2023-01-07 ENCOUNTER — Encounter: Payer: Self-pay | Admitting: Dermatology

## 2023-01-07 ENCOUNTER — Telehealth: Payer: Self-pay

## 2023-01-07 NOTE — Progress Notes (Signed)
Care Management & Coordination Services Pharmacy Team  Reason for Encounter: Appointment Reminder  Contacted patient to confirm telephone appointment with Charlene Brooke , PharmD on 01/11/23 at 3:00. Spoke with patient on 01/07/2023   Do you have any problems getting your medications? Patient trying to get PAP for current dermatology medication. Unsuccessful outreach to [facility]    What is your top health concern you would like to discuss at your upcoming visit? Continues to have spots on both arms.She quit taking ozempic due to side effects   Have you seen any other providers since your last visit with PCP? Yes- urology, neurology,dermatology   Hospital visits:  None in previous 6 months   Star Rating Drugs:  Medication:  Last Fill: Day Supply Glipizide 44m  12/03/22 90 Ozempic   manufacturer   Care Gaps: Annual wellness visit in last year? Yes  If Diabetic: Last eye exam / retinopathy screening:UTD Last diabetic foot exam:UTD   LCharlene Brooke PharmD notified  [facility] CK[place]tonAssistant [phone]

## 2023-01-09 DIAGNOSIS — E1165 Type 2 diabetes mellitus with hyperglycemia: Secondary | ICD-10-CM

## 2023-01-09 DIAGNOSIS — E6609 Other obesity due to excess calories: Secondary | ICD-10-CM

## 2023-01-10 LAB — CULTURE, URINE COMPREHENSIVE

## 2023-01-11 ENCOUNTER — Telehealth: Payer: Self-pay | Admitting: Family Medicine

## 2023-01-11 ENCOUNTER — Ambulatory Visit: Payer: PPO | Admitting: Pharmacist

## 2023-01-11 MED ORDER — OPZELURA 1.5 % EX CREA: TOPICAL_CREAM | CUTANEOUS | 3 refills | Status: AC

## 2023-01-11 MED ORDER — AMOXICILLIN-POT CLAVULANATE 875-125 MG PO TABS: 1.0000 | ORAL_TABLET | Freq: Two times a day (BID) | ORAL | 0 refills | Status: DC

## 2023-01-11 NOTE — Progress Notes (Unsigned)
Care Management & Coordination Services Pharmacy Note  01/11/2023 Name:  Emily Moon MRN:  [id number] DOB:  Feb 23, 1961  Summary: F/U visit -Pt has been taking old Trulicity (3 mg) rx; she denies skin issues, s/sx of angioedema she had with Ozempic  Recommendations/Changes made from today's visit: -Opzelura PAP - fax to dermatology -Stiolto PAP - fax income info -Gave Weight Mgmt clinic number -  Follow up plan: -[place] will call patient *** -Pharmacist follow up televisit scheduled for *** -    Subjective: Emily Moon is an 62 y.o. year old female who is a primary patient of [name], NP.  The care coordination team was consulted for assistance with disease management and care coordination needs.    Engaged with patient by telephone for follow up visit.  Recent office visits: 12/03/22 NP Allie Bossier: DM - A1c 6.2. Suspect angioedema may be ozempic (rare side effect). Stop Ozempic, start Glipizide XL 5 mg.  08/20/22 NP Allie Bossier OV: annual - referred to GI (colonoscopy) and DM nutritionist.  Recent consult visits: 01/06/23 PA Zara Council (Urology): trouble passing urine - suspected UTI. Rx Macrobid.   01/05/23 Dr Melrose Nakayama (Neurology): Possible MS. Refer for Lyme Disease  12/23/22 Dr Nehemiah Massed (Derm): rosea - rx montelukast 10 mg.  12/15/22 NP Beckey Rutter (Heme/onc): IDA, declines GI workup. HGB normal. Hold venofer for now.  12/08/22 Dr Casimiro Needle (Psych): f/u Bipolar I - therapist did not work out. No med changes.  12/01/22 PA Christell Faith (Cardiology): f/u sinus tach - d/c iron, vit C. Refer to wt mgmt.  09/28/22 Dr Matilde Sprang (Urology): chronic cystitis - d/c tamsulosin. Rx Estradiol cream. Refer to PT.  Hospital visits: None in previous 6 months   Objective:  Lab Results  Component Value Date   CREATININE 1.10 08/20/2022   BUN 16 08/20/2022   GFR 54.67 (L) 08/20/2022   GFRNONAA >60 03/23/2022   GFRAA >60 02/19/2020   NA 141 08/20/2022    K 4.1 08/20/2022   CALCIUM 9.0 08/20/2022   CO2 28 08/20/2022   GLUCOSE 133 (H) 08/20/2022    Lab Results  Component Value Date/Time   HGBA1C 6.2 (A) 12/03/2022 03:17 PM   HGBA1C 6.9 (H) 08/20/2022 03:08 PM   HGBA1C 6.5 (A) 04/01/2022 03:04 PM   HGBA1C 6.6 (H) 07/02/2021 01:08 PM   GFR 54.67 (L) 08/20/2022 03:08 PM   GFR 55.38 (L) 05/14/2022 11:43 AM   MICROALBUR <0.7 08/20/2022 03:08 PM   MICROALBUR 1.0 03/10/2021 04:28 PM    Last diabetic Eye exam:  Lab Results  Component Value Date/Time   HMDIABEYEEXA No Retinopathy 01/28/2022 12:00 AM    Last diabetic Foot exam: No results found for: "HMDIABFOOTEX"   Lab Results  Component Value Date   CHOL 146 08/20/2022   HDL 40.50 08/20/2022   LDLCALC 67 08/20/2022   LDLDIRECT 53.0 08/01/2020   TRIG 196.0 (H) 08/20/2022   CHOLHDL 4 08/20/2022       Latest Ref Rng & Units 08/20/2022    3:08 PM 12/24/2021    6:01 PM 06/23/2021    2:53 PM  Hepatic Function  Total Protein 6.0 - 8.3 g/dL 6.4  7.3  7.7   Albumin 3.5 - 5.2 g/dL 4.2  3.8  4.8   AST 0 - 37 U/L '9  22  24   '$ ALT 0 - 35 U/L 10  20  44   Alk Phosphatase 39 - 117 U/L 43  55  44   Total Bilirubin 0.2 -  1.2 mg/dL 0.3  0.7  0.5     Lab Results  Component Value Date/Time   TSH 0.902 02/19/2020 03:53 PM   TSH 1.31 05/10/2019 02:34 PM   TSH 4.32 11/24/2018 08:26 AM   FREET4 1.08 02/19/2020 03:53 PM   FREET4 0.82 07/25/2013 12:16 PM       Latest Ref Rng & Units 12/15/2022    3:14 PM 08/20/2022    3:08 PM 05/28/2022    1:30 PM  CBC  WBC 4.0 - 10.5 K/uL 7.5  5.8  7.9   Hemoglobin 12.0 - 15.0 g/dL 13.3  12.3  12.8   Hematocrit 36.0 - 46.0 % 39.9  36.3  38.0   Platelets 150 - 400 K/uL 434  414.0  431    Iron/TIBC/Ferritin/ %Sat    Component Value Date/Time   IRON 64 12/15/2022 1514   TIBC 470 (H) 12/15/2022 1514   FERRITIN 60 12/15/2022 1514   IRONPCTSAT 14 12/15/2022 1514    Lab Results  Component Value Date/Time   VD25OH 75.66 01/22/2020 02:32 PM   VD25OH  61.68 08/15/2019 11:13 AM   VITAMINB12 403 12/26/2021 05:31 AM   VITAMINB12 292 03/10/2021 04:28 PM    Clinical ASCVD: No  The 10-year ASCVD risk score (Arnett DK, et al., 2019) is: 5.8%   Values used to calculate the score:     Age: 22 years     Sex: Female     Is Non-Hispanic African American: No     Diabetic: Yes     Tobacco smoker: No     Systolic Blood Pressure: 123XX123 mmHg     Is BP treated: Yes     HDL Cholesterol: 40.5 mg/dL     Total Cholesterol: 146 mg/dL        12/03/2022    3:16 PM 10/01/2022    3:22 PM 08/20/2022    2:04 PM  Depression screen PHQ 2/9  Decreased Interest 3 0 3  Down, Depressed, Hopeless '3 2 3  '$ PHQ - 2 Score '6 2 6  '$ Altered sleeping '2 1 1  '$ Tired, decreased energy '3 1 3  '$ Change in appetite 3 0 1  Feeling bad or failure about yourself  '3 1 3  '$ Trouble concentrating 2 0 3  Moving slowly or fidgety/restless 1 0 1  Suicidal thoughts 0 0 0  PHQ-9 Score '20 5 18  '$ Difficult doing work/chores Somewhat difficult Not difficult at all Not difficult at all       12/03/2022    3:16 PM  GAD 7 : Generalized Anxiety Score  Nervous, Anxious, on Edge 3  Control/stop worrying 3  Worry too much - different things 3  Trouble relaxing 2  Restless 0  Easily annoyed or irritable 1  Afraid - awful might happen 2  Total GAD 7 Score 14  Anxiety Difficulty Somewhat difficult     Social History   Tobacco Use  Smoking Status Former   Packs/day: 1.00   Years: 25.00   Total pack years: 25.00   Types: Cigarettes   Quit date: 2019   Years since quitting: 5.1   Passive exposure: Past  Smokeless Tobacco Never  Tobacco Comments   occasional use   BP Readings from Last 3 Encounters:  01/06/23 108/60  12/23/22 113/76  12/15/22 (!) 111/49   Pulse Readings from Last 3 Encounters:  01/06/23 72  12/15/22 88  12/03/22 97   Wt Readings from Last 3 Encounters:  01/06/23 235 lb (106.6 kg)  12/15/22 244  lb (110.7 kg)  12/03/22 244 lb (110.7 kg)   BMI Readings  from Last 3 Encounters:  01/06/23 34.70 kg/m  12/15/22 36.03 kg/m  12/03/22 36.03 kg/m    Allergies  Allergen Reactions   Albuterol Shortness Of Breath and Other (See Comments)    Makes pt feel jittery/ tacycardic   Crestor [Rosuvastatin] Other (See Comments)    Joint pain, muscle pain, and hair loss   Halcion [Triazolam] Other (See Comments)    Dizziness,headaches,bladder problems   Levaquin [Levofloxacin In D5w] Diarrhea and Itching    Shoulder pain   Metformin And Related Rash   Naproxen Sodium     Patient tolerates in small doses. Her reaction is swelling of the lower extremities.    Ozempic (0.25 Or 0.5 Mg-Dose) [Semaglutide(0.25 Or 0.'5mg'$ -Dos)] Itching   Cefaclor Other (See Comments)    Doesn't remember---unsure if actually allergic    Sulfa Antibiotics Itching    Unsure of reaction possibly itching   Tramadol Itching and Nausea And Vomiting   Zinc Other (See Comments)    constipation  constipation    Aripiprazole Other (See Comments)    Muscle tension/cramping   Diclofenac Sodium Rash    "made very sick"    Medications Reviewed Today     Reviewed by Laneta Simmers (Physician Assistant Certified) on [id number] at [place] List Status: <None>   Medication Order Taking? Sig Documenting Provider Last Dose Status Informant  aspirin EC 81 MG tablet [id number] Yes Take 81 mg by mouth at bedtime. Swallow whole. [provider] Taking Active Self  Azelastine HCl 137 MCG/SPRAY SOLN [id number] Yes PLACE 2 SPRAYS INTO BOTH NOSTRILS 2 (TWO) TIMES DAILY [name], NP Taking Active   B Complex-Biotin-FA (BIG 100, BIOTIN, PO) [id number] Yes Take 300 mg by mouth at bedtime. [provider] Taking Active Self  cetirizine (ZYRTEC) 10 MG tablet [id number] Yes Take 10 mg by mouth 2 (two) times daily. [provider] Taking Active Self  clindamycin (CLEOCIN T) 1 % external solution [id number] Yes Apply to scalp once or twice a day prn bumps Moye,  [place], MD Taking Active Self  Continuous Blood Gluc Sensor (FREESTYLE LIBRE 3 SENSOR) MISC [id number] Yes PLACE 1 SENSOR ON THE SKIN EVERY 14 DAYS. USE TO CHECK GLUCOSE CONTINUOUSLY [name], NP Taking Active   cyanocobalamin (VITAMIN B12) 1000 MCG/ML injection [id number] Yes INJECT 1 ML (1,000 MCG TOTAL) INTO THE MUSCLE EVERY 30 (THIRTY) DAYS. FOR B12 VITAMIN [name], NP Taking Active   desipramine (NORPRAMIN) 25 MG tablet [id number] Yes 2  qam Plovsky, Berneta Sages, MD Taking Active   diphenhydrAMINE (BENADRYL) 2 % cream [id number] Yes Apply topically 3 (three) times daily as needed for itching. [provider] Taking Active Self  DULoxetine HCl 40 MG CPEP [id number] Yes Take 0.5 capsules (20 mg total) by mouth 2 (two) times daily. 2 q day Norma Fredrickson, MD Taking Active   estradiol (ESTRACE) 0.1 MG/GM vaginal cream [id number] Yes Estrogen Cream Instruction Discard applicator Apply pea sized amount to tip of finger to urethra before bed. Wash hands well after application. Use Monday, Wednesday and Friday MacDiarmid, Scott, MD Taking Active   Evolocumab [facility] SURECLICK) [id number] MG/ML Darden Palmer [id number] Yes Inject 140 mg into the skin every 14 (fourteen) days. [name] Sable, MD Taking Active   fenofibrate (TRICOR) 145 MG tablet [id number] Yes Take 1 tablet (145 mg total) by mouth daily. For cholesterol. Please call [phone] for an appointment. Rise Mu, PA-C Taking  Active   glipiZIDE (GLUCOTROL XL) 5 MG 24 hr tablet [id number] Yes Take 1 tablet (5 mg total) by mouth daily with breakfast. for diabetes. [name], NP Taking Active   hydrOXYzine (VISTARIL) 50 MG capsule [id number] Yes Take 2 capsules (100 mg total) by mouth at bedtime. TAKE 1 CAPSLE BY MOUTH AT NOON, 1 AT 6 PM, AND 1 AS NEEDED Plovsky, Gerald, MD Taking Active   ibuprofen (ADVIL) 200 MG tablet [id number] Yes Take 400 mg by mouth 2 (two) times daily. [provider] Taking Active   ketoconazole  (NIZORAL) 2 % shampoo [id number] Yes Apply 1 Application topically as directed. Wash from waist up 3 times a week for 2 months, then decrease to 1 time a month, let sit 5 minutes and rinse off Ralene Bathe, MD Taking Active   lamoTRIgine (LAMICTAL) 150 MG tablet [id number] Yes Take 2 tablets (300 mg total) by mouth daily at bedtime. Norma Fredrickson, MD Taking Active   levalbuterol [facility] HFA) 45 MCG/ACT inhaler [id number] Yes Inhale 2 puffs into the lungs every 4 (four) hours as needed for wheezing. [provider] Taking Active   Lysine 500 MG TABS [id number] Yes Take 1,000 mg by mouth daily. [provider] Taking Active Self  methocarbamol (ROBAXIN) 500 MG tablet [id number] Yes TAKE 1-2 TABLETS (500-1,000 MG TOTAL) BY MOUTH AT BEDTIME AS NEEDED FOR MUSCLE SPASMS. [name], NP Taking Active   metoprolol tartrate (LOPRESSOR) 50 MG tablet [id number] Yes Take 1 tablet (50 mg total) by mouth 2 (two) times daily. Rise Mu, PA-C Taking Active   montelukast (SINGULAIR) 10 MG tablet [id number] Yes Take 1 tablet (10 mg total) by mouth at bedtime. Ralene Bathe, MD Taking Active   mupirocin ointment (BACTROBAN) 2 % [id number] Yes Apply 1 Application topically daily. Qd to wound on finger Ralene Bathe, MD Taking Active   neomycin-bacitracin-polymyxin [facility]) [phone] ointment [id number] Yes Apply 1 application topically 4 (four) times daily as needed (for cut/scrapes.). [provider] Taking Active Self           Med Note Earna Coder Mar 10, 2021  3:56 PM)    nitrofurantoin, macrocrystal-monohydrate, (MACROBID) 100 MG capsule [id number] Yes Take 1 capsule (100 mg total) by mouth every 12 (twelve) hours. Zara Council A, PA-C  Active   pantoprazole (PROTONIX) 40 MG tablet [id number] Yes TAKE 1 TABLET (40 MG TOTAL) BY MOUTH 2 (TWO) TIMES DAILY BEFORE A MEAL. FOR HEARTBURN [name], NP Taking Active   polyvinyl alcohol (LIQUIFILM TEARS)  1.4 % ophthalmic solution [id number] Yes Place 2 drops into both eyes at bedtime. [provider] Taking Active Self  Ruxolitinib Phosphate (OPZELURA) 1.5 % CREA [id number] Yes Apply topically. [provider] Taking Active   Discontinued 01/10/23 1658 (Allergic reaction)            Med Note Oneita Jolly Sep 28, 2022  1:05 PM)    March Rummage 2.5-2.5 MCG/ACT AERS [id number] Yes Inhale 2 puffs into the lungs at bedtime. [provider] Taking Active Self           Med Note Oneita Jolly Sep 28, 2022  1:05 PM)    temazepam (RESTORIL) 30 MG capsule [id number] Yes TAKE ONE TO TWO CAPSULES BY MOUTH NIGHTLY AT BEDTIME Plovsky, Berneta Sages, MD Taking Active   traZODone (DESYREL) 50 MG tablet [id number] Yes Take 2 tablets (100 mg total) by mouth at bedtime.  Norma Fredrickson, MD Taking Active   trimethoprim (TRIMPEX) 100 MG tablet [id number] Yes Take 1 tablet (100 mg total) by mouth daily. Bjorn Loser, MD Taking Active   valACYclovir (VALTREX) 1000 MG tablet [id number] Yes Take 2 tablets by mouth twice daily for 1 day as needed for herpes outbreak. [name], NP Taking Active   Vitamin D3 (VITAMIN D) 25 MCG tablet [id number] Yes Take 3,000 Units by mouth daily. [provider] Taking Active Self            SDOH:  (Social Determinants of Health) assessments and interventions performed: {yes/no:20286} SDOH Interventions    Flowsheet Row Clinical Support from 10/01/2022 in [place] at [place] from 06/03/2021 in [place] at [facility] Visit from 04/23/2021 in [place] at [place] from 04/05/2020 in [place] at [place] from 03/06/2020 in [place] at [facility] Visit from 10/25/2015 in [place] at [place] Interventions Intervention Not Indicated -- -- -- -- --  Housing Interventions Intervention Not Indicated -- -- -- -- --  Transportation Interventions Intervention Not Indicated -- -- -- -- --  Utilities Interventions Intervention Not Indicated -- -- -- -- --  Alcohol Usage Interventions Intervention Not Indicated (Score <7) -- -- -- -- --  Depression Interventions/Treatment  Medication Currently on Treatment Counseling Currently on Treatment Medication, Counseling Currently on Treatment  Financial Strain Interventions Intervention Not Indicated -- -- -- -- --  Physical Activity Interventions Intervention Not Indicated -- -- -- -- --  Stress Interventions Intervention Not Indicated -- -- -- -- --  Social Connections Interventions Intervention Not Indicated -- -- -- -- --       Medication Assistance:  Repatha - [employer] grant: 11/05/22 - 11/05/23 BIN: [id number] PCN: [id number] GRP: [id number] ID: [id number]  Medication Access: Within the past 30 days, how often has patient missed a dose of medication? *** Is a pillbox or other method used to improve adherence? {YES/NO:21197} Factors that may affect medication adherence? {CHL DESC; BARRIERS:21522} Are meds synced by current pharmacy? {YES/NO:21197} Are meds delivered by current pharmacy? {YES/NO:21197} Does patient experience delays in picking up medications due to transportation concerns? {YES/NO:21197}  [facility] Reviewed: Is patient disadvantaged to use [facility]?: {YES/NO:21197} Current Rx insurance plan: *** Name and location of Current pharmacy:  [id number] [place], [place]Phone: [phone]: [phone] [facility] services reviewed with patient today?: {YES/NO:21197} Patient requests to transfer care to [facility]?: {YES/NO:21197} Reason patient declined to change pharmacies: {US patient  preference:27474}  Compliance/Adherence/Medication fill history: Care Gaps: Colonoscopy (never done) Lung cancer screening (due 03/2012) - quit date 11/2017  Star-Rating Drugs: Glipizide - PDC 100%   Assessment/Plan   Diabetes (A1c goal <7%) -Controlled - A1c is at goal; pt has stopped metformin due to skin rash; she started [employer] - Diabetes program in January and reports it has been very helpful Reviewed AGP report: 12/29/22 to 01/11/23. Sensor active: 98%  Time in range (70-180): 91% (goal > 70%) (WITHOUT OZEMPIC)  High (180-250): 7%  Very high (>250): 2%  Low (< 70): 0% (goal < 4%)  GMI: 6.5%; Average glucose: 132  Previous AGP report: 06/24/22 to 07/07/22. Sensor active: 60% (ON OZEMPIC)  Time in range (  70-180): 97% (goal > 70%)  High (>180): 3%  Low (< 70): 0% (goal < 4%)  GMI: 6.4%; Average glucose: 129  -Current medications: Glipizide XL 5 mg daily AM - Appropriate, Effective, Safe, Accessible Trulicity 3 mg weekly - Appropriate, Effective, Safe, Accessible Freestyle Libre 3 - Appropriate, Effective, Safe, Accessible -Medications previously tried: metformin, Trulicity, Ozempic (rash, angioedema) -Educated on A1c and blood sugar goals; Carbohydrate counting and/or plate method -Reviewed AGP - DM controls remains excellent -Recommend to continue current medication  Tachycardia (Goal: HR < 100) -Controlled - pt reports improvement in side effect since switching from succinate to tartrate; -Current treatment  Metoprolol tartrate 50 mg BID - Appropriate, Effective, Safe, Accessible -Recommended to continue current medication  Hyperlipidemia: (LDL goal < 100) -Controlled - LDL 67 (08/2022) at goal;  pt endorses compliance with medications; she is intolerant to statins; -Current treatment: Repatha 140 mg q14 days - Appropriate, Effective, Safe, Query Accessible Fenofibrate 145 mg daily - Appropriate, Effective, Safe, Accessible Aspirin 81 mg daily - Appropriate,  Effective, Safe, Accessible -Educated on Cholesterol goals;  -Reviewed [employer] grant via online portal, it remains active -Recommended to continue current medication  COPD / asthma (Goal: control symptoms and prevent exacerbations) -Controlled - pt reports she is using Stiolto and it is working well for her, she gets it through PAP now -Pulmonary function testing: FEV1 at 63%, ratio 72, FVC 68% (04/2020) -Current treatment  Stiolto Respimat 2.5-2.5 mcg/act - 2 puff daily (PAP)- Appropriate, Effective, Safe, Query Accessible Levalbuterol HFA prn - once a day typically - Appropriate, Effective, Safe, Accessible Levalbuterol nebulizer -Appropriate, Effective, Safe, Accessible Cetirizine 10 mg HS - Appropriate, Effective, Safe, Accessible Azelastine 137 mcg nasal spray PRN -Appropriate, Effective, Safe, Accessible Montelukast 10 mg daily -Medications previously tried: Breo, Advair   -Patient reports consistent use of maintenance inhaler -Frequency of rescue inhaler use: daily -Counseled on Benefits of consistent maintenance inhaler use -Recommend to continue current medication  Bipolar Depression / Anxiety / Insomnia (Goal: manage symptoms) -Controlled - pt reports current regimen is working well enough for her -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 -managed per Dr Casimiro Needle -PHQ9: 15 (05/2021) - moderate/severe depression -Current treatment: Desipramine 25 mg -2 PM -Appropriate, Effective, Safe, Accessible Duloxetine 20 mg - 2 cap PM -Appropriate, Effective, Safe, Accessible Lamotrigine 150 mg - 2 tab HS -Appropriate, Effective, Safe, Accessible Temazepam 30 mg 1-2 cap HS -Appropriate, Effective, Safe, Accessible Trazodone 50 mg - 2 tab HS -Appropriate, Effective, Safe, Accessible Hydroxyzine 50 mg - 2 tab HS -Appropriate, Effective, Safe, Accessible -Medications previously tried/failed: Rexulti -Educated on Benefits of medication for  symptom control; discussed consequences of abruptly stopping medications, advised that she speak with psychiatrist if she is thinking about stopping any medications -Recommended to continue current medication  Multiple Sclerosis (Goal: manage symptoms) -Not ideally controlled  - pt has not started DMARD for MS due to cost concerns (dimethyl fumarate was approved by insurance but over $1000 still);  -Pt follows with Neurology (Dr Melrose Nakayama) - most recently recommended Ocrevus infusions, pt reports she has not started these yet -Current treatment  Methocarbamol 500 mg - Appropriate, Effective, Safe, Accessible Ocrevus infusions per neurology - not started/pending approval -Medications previously tried/failed: Dimethyl fumarate - $1100 copay -Recommended to follow up with neurology regarding Ocrevus infusions  Anemia (Goal: improve HGB) -Improving - pt has started iron infusions with hematology (3/9, 3/16, 3/23); most recent HGB 11.2 (03/24/22), Tsat 11.8% (12/31/21) has not been checked post-infusions;  she is still taking OTC iron twice daily and reports constipation -Current treatment  Ferrous sulfate 325 mg BID - Query appropriate -Advised to take iron with Vitamin C to maximize absorption -Reviewed constipation side effect - it would be reasonable to reduce oral iron to daily or every-other-day since she is s/p infusions -Recommend to continue current medication; f/u with hematology as scheduled  Pain (Goal: mange pain) -Not ideally controlled - pt is using NSAIDs daily -Lower back pain; knee pain (fracture 03/2022) -Current treatment  Ibuprofen 200 mg - 2 tab BID Methocarbamol 500 mg  -Medications previously tried: Tylenol (does not work)  -Reviewed risks of chronic NSAID use including kidney impairment, hypertension, and GI bleeding risk; pt voiced understanding of risks and will try to limit use  Recurrent UTI (Goal: reduce frequency) -{US controlled/uncontrolled:25276} -Current treatment   Estradiol 0.1 mg/gm vaginal cream Trimethoprim 100 mg daily -Medications previously tried: n/a  -{CCMPHARMDINTERVENTION:25122}  Dermatologic (Goal: ***) -{US controlled/uncontrolled:25276} -Current treatment  Cerave moisturizing itch relief -Medications previously tried: ***  -{CCMPHARMDINTERVENTION:25122}    Charlene Brooke, PharmD, BCACP Clinical Pharmacist [facility] at [facility] [phone]

## 2023-01-11 NOTE — Telephone Encounter (Signed)
-----   Message from Nori Riis, PA-C sent at 01/10/2023  5:52 PM EST ----- Please let Mrs. Joerger know that her urine culture was positive for two bacteria.  I would like for her to stop the Macrobid and start Augmentin 875/125, twice daily for seven days.  We will also need to recheck her urine in two months to make sure the microscopic blood does not persist after the treatment of the infection.

## 2023-01-11 NOTE — Telephone Encounter (Signed)
LMOM for patient to return call.

## 2023-01-12 NOTE — Telephone Encounter (Signed)
Patient notified and voiced understanding, ABX sent to pharmacy and appointment has been made.

## 2023-01-13 NOTE — Patient Instructions (Signed)
Visit Information  Phone number for Pharmacist: [phone]  Thank you for meeting with me to discuss your medications! Below is a summary of what we talked about during the visit:    Recommendations/Changes made from today's visit: -Faxed Opzelura PAP document to dermatology to complete and fax back -Stiolto PAP: faxed income documents to [place] pt Weight Mgmt clinic number to schedule appt  Follow up plan: -Health Concierge will follow up on PAP until approved -Pharmacist follow up televisit scheduled for 6 months   Charlene Brooke, PharmD, BCACP Clinical Pharmacist [facility] at [facility] [phone]

## 2023-01-20 DIAGNOSIS — G4733 Obstructive sleep apnea (adult) (pediatric): Secondary | ICD-10-CM | POA: Diagnosis not present

## 2023-01-27 ENCOUNTER — Telehealth: Payer: Self-pay

## 2023-01-27 NOTE — Progress Notes (Signed)
Care Management & Coordination Services Pharmacy Team  Reason for Encounter: PAP approval for Stiolto    Spoke with patient on 01/27/2023   Contacted manufacturer for update on status of application for [employer]. Patient is approved thru 11/16/2023 and shipment will go out 01/28/23 [employer] to patients address.  Charlene Brooke, PharmD notified  Avel Sensor, [place] Assistant [phone]

## 2023-01-28 ENCOUNTER — Encounter: Payer: Self-pay | Admitting: Dermatology

## 2023-01-28 ENCOUNTER — Ambulatory Visit: Payer: PPO | Admitting: Dermatology

## 2023-01-28 VITALS — BP 102/69 | HR 95

## 2023-01-28 DIAGNOSIS — R21 Rash and other nonspecific skin eruption: Secondary | ICD-10-CM

## 2023-01-28 DIAGNOSIS — L503 Dermatographic urticaria: Secondary | ICD-10-CM

## 2023-01-28 DIAGNOSIS — L42 Pityriasis rosea: Secondary | ICD-10-CM

## 2023-01-28 DIAGNOSIS — Z79899 Other long term (current) drug therapy: Secondary | ICD-10-CM

## 2023-01-28 DIAGNOSIS — L508 Other urticaria: Secondary | ICD-10-CM

## 2023-01-28 NOTE — Progress Notes (Signed)
   Follow-Up Visit   Subjective  Emily Moon is a 62 y.o. female who presents for the following: 6 week follow up (6 week follow up on pityriasis rosea at arms, legs, trunk. Patient reports still not improved. States will be going for lyme disease testing tomorrow with [facility] infectious disease control. ).  The following portions of the chart were reviewed this encounter and updated as appropriate:  Tobacco  Allergies  Meds  Problems  Med Hx  Surg Hx  Fam Hx     Review of Systems: No other skin or systemic complaints except as noted in HPI or Assessment and Plan.  Objective  Well appearing patient in no apparent distress; mood and affect are within normal limits.  A focused examination was performed including legs, arms, trunk, thighs. Relevant physical exam findings are noted in the Assessment and Plan.  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 Opzelura seems to have helped, but not covered by insurance Continue Zrytec and Pantoprazole daily.  Continue Singulair 10 mg 1 po qd May consider Xolair injections in the future after evaluation by Dr. Ola Spurr at [facility] Infectious Disease   Chronic and persistent condition with duration or expected duration over one year. Condition is symptomatic / bothersome to patient. Not to goal.  Will await initiation of xolair until Dr. Ola Spurr with Infectious Disease Control at [facility] determines treatment If Dr. Ola Spurr decides not to initiate any treatment may consider 1 month supply of doxycycline 100 mg po bid to see if helps.  Patient instructed to call after she is aware of test results with Dr. Ola Spurr.    Related Medications montelukast (SINGULAIR) 10 MG tablet Take 1 tablet (10 mg total) by mouth at  bedtime.  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 Threasa Heads determines further evaluation and treatment.   Return for 3 - 4 month urticaria follow up.  IRuthell Rummage, CMA, am acting as scribe for Sarina Ser, MD. Documentation: I have reviewed the above documentation for accuracy and completeness, and I agree with the above.  Sarina Ser, MD

## 2023-01-28 NOTE — Patient Instructions (Addendum)
Can continue cerave anti itch as needed.  Can antihistamines zyrtec, and singuliar  Can use opzelura samples - apply to affected areas daily as needed.         Due to recent changes in healthcare laws, you may see results of your pathology and/or laboratory studies on MyChart before the doctors have had a chance to review them. We understand that in some cases there may be results that are confusing or concerning to you. Please understand that not all results are received at the same time and often the doctors may need to interpret multiple results in order to provide you with the best plan of care or course of treatment. Therefore, we ask that you please give [place] 2 business days to thoroughly review all your results before contacting the office for clarification. Should we see a critical lab result, you will be contacted sooner.   If You Need Anything After Your Visit  If you have any questions or concerns for your doctor, please call our main line at [phone] and press option 4 to reach your doctor's medical assistant. If no one answers, please leave a voicemail as directed and we will return your call as soon as possible. Messages left after 4 pm will be answered the following business day.   You may also send [place] a message via [place]. We typically respond to MyChart messages within 1-2 business days.  For prescription refills, please ask your pharmacy to contact our office. Our fax number is [phone].  If you have an urgent issue when the clinic is closed that cannot wait until the next business day, you can page your doctor at the number below.    Please note that while we do our best to be available for urgent issues outside of office hours, we are not available 24/7.   If you have an urgent issue and are unable to reach [place], you may choose to seek medical care at your doctor's office, retail clinic, urgent care center, or emergency room.  If you have a medical emergency,  please immediately call 911 or go to the emergency department.  Pager Numbers  - Dr. Nehemiah Massed: [phone]  - Dr. Laurence Ferrari: [phone]  - Dr. Nicole Kindred: [phone]  In the event of inclement weather, please call our main line at [phone] for an update on the status of any delays or closures.  Dermatology Medication Tips: Please keep the boxes that topical medications come in in order to help keep track of the instructions about where and how to use these. Pharmacies typically print the medication instructions only on the boxes and not directly on the medication tubes.   If your medication is too expensive, please contact our office at [phone] option 4 or send [place] a message through [place].   We are unable to tell what your co-pay for medications will be in advance as this is different depending on your insurance coverage. However, we may be able to find a substitute medication at lower cost or fill out paperwork to get insurance to cover a needed medication.   If a prior authorization is required to get your medication covered by your insurance company, please allow [place] 1-2 business days to complete this process.  Drug prices often vary depending on where the prescription is filled and some pharmacies may offer cheaper prices.  The website www.goodrx.com contains coupons for medications through different pharmacies. The prices here do not account for what the cost may be with help from insurance (  it may be cheaper with your insurance), but the website can give you the price if you did not use any insurance.  - You can print the associated coupon and take it with your prescription to the pharmacy.  - You may also stop by our office during regular business hours and pick up a GoodRx coupon card.  - If you need your prescription sent electronically to a different pharmacy, notify our office through [facility] or by phone at [phone] option 4.     Si Usted Necesita Algo  Despus de Su Visita  Tambin puede enviarnos un mensaje a travs de Pharmacist, community. Por lo general respondemos a los mensajes de MyChart en el transcurso de 1 a 2 das hbiles.  Para renovar recetas, por favor pida a su farmacia que se ponga en contacto con nuestra oficina. Harland Dingwall de fax es [place] [phone].  Si tiene un asunto urgente cuando la clnica est cerrada y que no puede esperar hasta el siguiente da hbil, puede llamar/localizar a su doctor(a) al nmero que aparece a continuacin.   Por favor, tenga en cuenta que aunque hacemos todo lo posible para estar disponibles para asuntos urgentes fuera del horario de [place], no estamos disponibles las 24 horas del da, los 7 das de la [place].   Si tiene un problema urgente y no puede comunicarse con nosotros, puede optar por buscar atencin mdica  en el consultorio de su doctor(a), en una clnica privada, en un centro de atencin urgente o en una sala de emergencias.  Si tiene Engineering geologist, por favor llame inmediatamente al 911 o vaya a la sala de emergencias.  Nmeros de bper  - Dr. Nehemiah Massed: [phone]  - Dra. Moye: [phone]  - Dra. Nicole Kindred: [phone]  En caso de inclemencias del [place], por favor llame a Johnsie Kindred principal al [phone] para una actualizacin sobre el [place] de cualquier retraso o cierre.  Consejos para la medicacin en dermatologa: Por favor, guarde las cajas en las que vienen los medicamentos de uso tpico para ayudarle a seguir las instrucciones sobre dnde y cmo usarlos. Las farmacias generalmente imprimen las instrucciones del medicamento slo en las cajas y no directamente en los tubos del [place].   Si su medicamento es muy caro, por favor, pngase en contacto con Zigmund Daniel llamando al [phone] y presione la opcin 4 o envenos un mensaje a travs de Pharmacist, community.   No podemos decirle cul ser su copago por los medicamentos por adelantado ya que esto es diferente  dependiendo de la cobertura de su seguro. Sin embargo, es posible que podamos encontrar un medicamento sustituto a Electrical engineer un formulario para que el seguro cubra el medicamento que se considera necesario.   Si se requiere una autorizacin previa para que su compaa de seguros [place] su medicamento, por favor permtanos de 1 a 2 das hbiles para completar este proceso.  Los precios de los medicamentos varan con frecuencia dependiendo del Environmental consultant de dnde se surte la receta y alguna farmacias pueden ofrecer precios ms baratos.  El sitio web www.goodrx.com tiene cupones para medicamentos de Airline pilot. Los precios aqu no tienen en cuenta lo que podra costar con la ayuda del seguro (puede ser ms barato con su seguro), pero el sitio web puede darle el precio si no utiliz Research scientist (physical sciences).  - Puede imprimir el cupn correspondiente y llevarlo con su receta a la farmacia.  - Tambin puede pasar por nuestra oficina durante el horario de atencin  regular y recoger una tarjeta de cupones de GoodRx.  - Si necesita que su receta se enve electrnicamente a una farmacia diferente, informe a nuestra oficina a travs de MyChart de [place] o por telfono llamando al [phone] y presione la opcin 4.       Due to recent changes in healthcare laws, you may see results of your pathology and/or laboratory studies on MyChart before the doctors have had a chance to review them. We understand that in some cases there may be results that are confusing or concerning to you. Please understand that not all results are received at the same time and often the doctors may need to interpret multiple results in order to provide you with the best plan of care or course of treatment. Therefore, we ask that you please give [place] 2 business days to thoroughly review all your results before contacting the office for clarification. Should we see a critical lab result, you will be contacted sooner.   If You  Need Anything After Your Visit  If you have any questions or concerns for your doctor, please call our main line at [phone] and press option 4 to reach your doctor's medical assistant. If no one answers, please leave a voicemail as directed and we will return your call as soon as possible. Messages left after 4 pm will be answered the following business day.   You may also send [place] a message via [place]. We typically respond to MyChart messages within 1-2 business days.  For prescription refills, please ask your pharmacy to contact our office. Our fax number is [phone].  If you have an urgent issue when the clinic is closed that cannot wait until the next business day, you can page your doctor at the number below.    Please note that while we do our best to be available for urgent issues outside of office hours, we are not available 24/7.   If you have an urgent issue and are unable to reach [place], you may choose to seek medical care at your doctor's office, retail clinic, urgent care center, or emergency room.  If you have a medical emergency, please immediately call 911 or go to the emergency department.  Pager Numbers  - Dr. Nehemiah Massed: [phone]  - Dr. Laurence Ferrari: [phone]  - Dr. Nicole Kindred: [phone]  In the event of inclement weather, please call our main line at [phone] for an update on the status of any delays or closures.  Dermatology Medication Tips: Please keep the boxes that topical medications come in in order to help keep track of the instructions about where and how to use these. Pharmacies typically print the medication instructions only on the boxes and not directly on the medication tubes.   If your medication is too expensive, please contact our office at [phone] option 4 or send [place] a message through [place].   We are unable to tell what your co-pay for medications will be in advance as this is different depending on your insurance coverage. However,  we may be able to find a substitute medication at lower cost or fill out paperwork to get insurance to cover a needed medication.   If a prior authorization is required to get your medication covered by your insurance company, please allow [place] 1-2 business days to complete this process.  Drug prices often vary depending on where the prescription is filled and some pharmacies may offer cheaper prices.  The website www.goodrx.com contains coupons for medications through  different pharmacies. The prices here do not account for what the cost may be with help from insurance (it may be cheaper with your insurance), but the website can give you the price if you did not use any insurance.  - You can print the associated coupon and take it with your prescription to the pharmacy.  - You may also stop by our office during regular business hours and pick up a GoodRx coupon card.  - If you need your prescription sent electronically to a different pharmacy, notify our office through [facility] or by phone at [phone] option 4.     Si Usted Necesita Algo Despus de Su Visita  Tambin puede enviarnos un mensaje a travs de Pharmacist, community. Por lo general respondemos a los mensajes de MyChart en el transcurso de 1 a 2 das hbiles.  Para renovar recetas, por favor pida a su farmacia que se ponga en contacto con nuestra oficina. Harland Dingwall de fax es [place] [phone].  Si tiene un asunto urgente cuando la clnica est cerrada y que no puede esperar hasta el siguiente da hbil, puede llamar/localizar a su doctor(a) al nmero que aparece a continuacin.   Por favor, tenga en cuenta que aunque hacemos todo lo posible para estar disponibles para asuntos urgentes fuera del horario de [place], no estamos disponibles las 24 horas del da, los 7 das de la [place].   Si tiene un problema urgente y no puede comunicarse con nosotros, puede optar por buscar atencin mdica  en el consultorio de su doctor(a), en una  clnica privada, en un centro de atencin urgente o en una sala de emergencias.  Si tiene Engineering geologist, por favor llame inmediatamente al 911 o vaya a la sala de emergencias.  Nmeros de bper  - Dr. Nehemiah Massed: [phone]  - Dra. Moye: [phone]  - Dra. Nicole Kindred: [phone]  En caso de inclemencias del [place], por favor llame a Johnsie Kindred principal al [phone] para una actualizacin sobre el [place] de cualquier retraso o cierre.  Consejos para la medicacin en dermatologa: Por favor, guarde las cajas en las que vienen los medicamentos de uso tpico para ayudarle a seguir las instrucciones sobre dnde y cmo usarlos. Las farmacias generalmente imprimen las instrucciones del medicamento slo en las cajas y no directamente en los tubos del [place].   Si su medicamento es muy caro, por favor, pngase en contacto con Zigmund Daniel llamando al [phone] y presione la opcin 4 o envenos un mensaje a travs de Pharmacist, community.   No podemos decirle cul ser su copago por los medicamentos por adelantado ya que esto es diferente dependiendo de la cobertura de su seguro. Sin embargo, es posible que podamos encontrar un medicamento sustituto a Electrical engineer un formulario para que el seguro cubra el medicamento que se considera necesario.   Si se requiere una autorizacin previa para que su compaa de seguros [place] su medicamento, por favor permtanos de 1 a 2 das hbiles para completar este proceso.  Los precios de los medicamentos varan con frecuencia dependiendo del Environmental consultant de dnde se surte la receta y alguna farmacias pueden ofrecer precios ms baratos.  El sitio web www.goodrx.com tiene cupones para medicamentos de Airline pilot. Los precios aqu no tienen en cuenta lo que podra costar con la ayuda del seguro (puede ser ms barato con su seguro), pero el sitio web puede darle el precio si no utiliz Research scientist (physical sciences).  - Puede imprimir el cupn correspondiente y  llevarlo con  su receta a la farmacia.  - Tambin puede pasar por nuestra oficina durante el horario de atencin regular y Charity fundraiser una tarjeta de cupones de GoodRx.  - Si necesita que su receta se enve electrnicamente a una farmacia diferente, informe a nuestra oficina a travs de MyChart de  o por telfono llamando al [phone] y presione la opcin 4.

## 2023-01-29 DIAGNOSIS — G35 Multiple sclerosis: Secondary | ICD-10-CM | POA: Diagnosis not present

## 2023-01-29 DIAGNOSIS — L989 Disorder of the skin and subcutaneous tissue, unspecified: Secondary | ICD-10-CM | POA: Diagnosis not present

## 2023-01-30 ENCOUNTER — Encounter: Payer: Self-pay | Admitting: Dermatology

## 2023-02-01 DIAGNOSIS — H43813 Vitreous degeneration, bilateral: Secondary | ICD-10-CM | POA: Diagnosis not present

## 2023-02-01 LAB — HM DIABETES EYE EXAM

## 2023-02-05 ENCOUNTER — Ambulatory Visit: Payer: PPO | Admitting: Internal Medicine

## 2023-02-08 ENCOUNTER — Encounter: Payer: Self-pay | Admitting: *Deleted

## 2023-02-08 ENCOUNTER — Ambulatory Visit (INDEPENDENT_AMBULATORY_CARE_PROVIDER_SITE_OTHER): Payer: PPO | Admitting: Internal Medicine

## 2023-02-08 ENCOUNTER — Encounter: Payer: Self-pay | Admitting: Internal Medicine

## 2023-02-08 VITALS — BP 98/68 | HR 94 | Temp 97.6°F | Ht 69.0 in | Wt 244.0 lb

## 2023-02-08 DIAGNOSIS — I10 Essential (primary) hypertension: Secondary | ICD-10-CM

## 2023-02-08 DIAGNOSIS — E1159 Type 2 diabetes mellitus with other circulatory complications: Secondary | ICD-10-CM

## 2023-02-08 DIAGNOSIS — N6452 Nipple discharge: Secondary | ICD-10-CM | POA: Diagnosis not present

## 2023-02-08 MED ORDER — GLIPIZIDE ER 10 MG PO TB24: 10.0000 mg | ORAL_TABLET | Freq: Every day | ORAL | 3 refills | Status: DC

## 2023-02-08 NOTE — Assessment & Plan Note (Signed)
Did not tolerate ozempic Feels the trulicity didn't work--but has a lot left over and restarted Sugars are fair---discussed that I would like her to stay on this for 2 months Will increase the glipizide to 10mg  daily See back in 2 months

## 2023-02-08 NOTE — Progress Notes (Signed)
Subjective:    Patient ID: Emily Moon, female    DOB: 02/26/1961, 62 y.o.   MRN: [id number]  HPI Here with several concerns  Wants to switch to me---did seem me a while back  Feels the trulicity is not effective--used it for a year a while ago but only just restarted it Has CGM---and now her A1c is up to 7% Time in range down to 47%---and average up (but still in 140's Has been doubling the glipizide--feels it doesn't work at all (unless she doesn't eat) Yesterday she doubled the glipizide dose--no reaction  Ozempic worked--but she had side effects (suicidal ideation) Asks about bydureon  She is also concerned about some white discharge from her left breast Also slightly blood with some  Current Outpatient Medications on File Prior to Visit  Medication Sig Dispense Refill   aspirin EC 81 MG tablet Take 81 mg by mouth at bedtime. Swallow whole.     Azelastine HCl 137 MCG/SPRAY SOLN PLACE 2 SPRAYS INTO BOTH NOSTRILS 2 (TWO) TIMES DAILY 30 mL 2   B Complex-Biotin-FA (BIG 100, BIOTIN, PO) Take 300 mg by mouth at bedtime.     bisacodyl (DULCOLAX) 5 MG EC tablet Take 5 mg by mouth daily as needed for moderate constipation.     cetirizine (ZYRTEC) 10 MG tablet Take 10 mg by mouth 2 (two) times daily.     clindamycin (CLEOCIN T) 1 % external solution Apply to scalp once or twice a day prn bumps 30 mL 2   Continuous Blood Gluc Sensor (FREESTYLE LIBRE 3 SENSOR) MISC PLACE 1 SENSOR ON THE SKIN EVERY 14 DAYS. USE TO CHECK GLUCOSE CONTINUOUSLY 6 each 1   cyanocobalamin (VITAMIN B12) 1000 MCG/ML injection INJECT 1 ML (1,000 MCG TOTAL) INTO THE MUSCLE EVERY 30 (THIRTY) DAYS. FOR B12 VITAMIN 3 mL 0   desipramine (NORPRAMIN) 25 MG tablet 2  qam 60 tablet 6   Dulaglutide (TRULICITY) 3 0000000 SOPN Inject 3 mg into the skin once a week.     DULoxetine HCl 40 MG CPEP Take 0.5 capsules (20 mg total) by mouth 2 (two) times daily. 2 q day 60 capsule 4   Evolocumab (REPATHA SURECLICK) [id number] MG/ML  SOAJ Inject 140 mg into the skin every 14 (fourteen) days. 2 mL 5   fenofibrate (TRICOR) 145 MG tablet Take 1 tablet (145 mg total) by mouth daily. For cholesterol. Please call [phone] for an appointment. 90 tablet 3   hydrOXYzine (VISTARIL) 50 MG capsule Take 2 capsules (100 mg total) by mouth at bedtime. TAKE 1 CAPSLE BY MOUTH AT NOON, 1 AT 6 PM, AND 1 AS NEEDED 90 capsule 3   ibuprofen (ADVIL) 200 MG tablet Take 400 mg by mouth 2 (two) times daily.     ketoconazole (NIZORAL) 2 % shampoo Apply 1 Application topically as directed. Wash from waist up 3 times a week for 2 months, then decrease to 1 time a month, let sit 5 minutes and rinse off 120 mL 6   lamoTRIgine (LAMICTAL) 150 MG tablet Take 2 tablets (300 mg total) by mouth daily at bedtime. 60 tablet 6   levalbuterol (XOPENEX HFA) 45 MCG/ACT inhaler Inhale 2 puffs into the lungs every 4 (four) hours as needed for wheezing.     Lysine 500 MG TABS Take 1,000 mg by mouth daily.     methocarbamol (ROBAXIN) 500 MG tablet TAKE 1-2 TABLETS (500-1,000 MG TOTAL) BY MOUTH AT BEDTIME AS NEEDED FOR MUSCLE SPASMS. 180 tablet 0  metoprolol tartrate (LOPRESSOR) 50 MG tablet Take 1 tablet (50 mg total) by mouth 2 (two) times daily. 180 tablet 3   montelukast (SINGULAIR) 10 MG tablet Take 1 tablet (10 mg total) by mouth at bedtime. 30 tablet 3   mupirocin ointment (BACTROBAN) 2 % Apply 1 Application topically daily. Qd to wound on finger 22 g 1   neomycin-bacitracin-polymyxin (NEOSPORIN) [phone] ointment Apply 1 application topically 4 (four) times daily as needed (for cut/scrapes.).     pantoprazole (PROTONIX) 40 MG tablet TAKE 1 TABLET (40 MG TOTAL) BY MOUTH 2 (TWO) TIMES DAILY BEFORE A MEAL. FOR HEARTBURN 180 tablet 3   polyvinyl alcohol (LIQUIFILM TEARS) 1.4 % ophthalmic solution Place 2 drops into both eyes at bedtime.     Ruxolitinib Phosphate (OPZELURA) 1.5 % CREA Apply to aa's rash QD PRN. 60 g 3   temazepam (RESTORIL) 30 MG capsule TAKE ONE TO  TWO CAPSULES BY MOUTH NIGHTLY AT BEDTIME 60 capsule 5   traZODone (DESYREL) 50 MG tablet Take 2 tablets (100 mg total) by mouth at bedtime. 60 tablet 7   trimethoprim (TRIMPEX) 100 MG tablet Take 1 tablet (100 mg total) by mouth daily. 30 tablet 11   valACYclovir (VALTREX) 1000 MG tablet Take 2 tablets by mouth twice daily for 1 day as needed for herpes outbreak. 12 tablet 0   Vitamin D3 (VITAMIN D) 25 MCG tablet Take 3,000 Units by mouth daily.     diphenhydrAMINE (BENADRYL) 2 % cream Apply topically 3 (three) times daily as needed for itching. (Patient not taking: Reported on 02/08/2023)     estradiol (ESTRACE) 0.1 MG/GM vaginal cream Estrogen Cream Instruction Discard applicator Apply pea sized amount to tip of finger to urethra before bed. Wash hands well after application. Use Monday, Wednesday and Friday (Patient not taking: Reported on 02/08/2023) 42.5 g 11   STIOLTO RESPIMAT 2.5-2.5 MCG/ACT AERS Inhale 2 puffs into the lungs at bedtime. (Patient not taking: Reported on 02/08/2023)     No current facility-administered medications on file prior to visit.    Allergies  Allergen Reactions   Albuterol Shortness Of Breath and Other (See Comments)    Makes pt feel jittery/ tacycardic   Crestor [Rosuvastatin] Other (See Comments)    Joint pain, muscle pain, and hair loss   Halcion [Triazolam] Other (See Comments)    Dizziness,headaches,bladder problems   Levaquin [Levofloxacin In D5w] Diarrhea and Itching    Shoulder pain   Metformin And Related Rash   Naproxen Sodium     Patient tolerates in small doses. Her reaction is swelling of the lower extremities.    Ozempic (0.25 Or 0.5 Mg-Dose) [Semaglutide(0.25 Or 0.5mg -Dos)] Itching   Cefaclor Other (See Comments)    Doesn't remember---unsure if actually allergic    Sulfa Antibiotics Itching    Unsure of reaction possibly itching   Tramadol Itching and Nausea And Vomiting   Zinc Other (See Comments)    constipation  constipation     Aripiprazole Other (See Comments)    Muscle tension/cramping   Diclofenac Sodium Rash    "made very sick"    Past Medical History:  Diagnosis Date   Acute pyelonephritis 12/25/2021   Arthritis    osteo   Asthma    Bipolar disorder ([place]) 05/21/2014   Cataracts, bilateral    COPD (chronic obstructive pulmonary disease) (HCC)    DDD (degenerative disc disease), cervical    also back   Depression    Diabetes mellitus without complication ([place])  Dizziness    Positional   Edema    feet/legs   Fibromyalgia syndrome    Fungal infection    Finger nails   GERD (gastroesophageal reflux disease)    Gout    Heart palpitations    Hip dysplasia, congenital 09/15/2013   History of kidney stones    Hypercholesterolemia    IDA (iron deficiency anemia)    Multiple sclerosis (HCC)    weakness   Osteoporosis    osteoarthritis   Pneumonia    PONV (postoperative nausea and vomiting)    no problem after cataract surgery   Prediabetes 07/22/2018   Psoriasis    Shortness of breath dyspnea    wheezing   Sleep apnea 2012   sleep study / slight, no interventions   Urinary frequency    Weight gain 06/21/2014    Past Surgical History:  Procedure Laterality Date   CATARACT EXTRACTION W/PHACO Left 05/21/2015   Procedure: CATARACT EXTRACTION PHACO AND INTRAOCULAR LENS PLACEMENT ([place]);  Surgeon: Birder Robson, MD;  Location: [facility];  Service: Ophthalmology;  Laterality: Left;  [place] 00:35 AP% 22.9 CDE 8.11 fluid pack lot [id number]   CATARACT EXTRACTION W/PHACO Right 06/04/2015   Procedure: CATARACT EXTRACTION PHACO AND INTRAOCULAR LENS PLACEMENT ([facility]);  Surgeon: Birder Robson, MD;  Location: [facility];  Service: Ophthalmology;  Laterality: Right;  US:00:48 AP%: 10.5 CDE:5.08 Fluid lot [id number]   CYSTOSCOPY/URETEROSCOPY/HOLMIUM LASER/STENT PLACEMENT Bilateral 09/22/2016   Procedure: CYSTOSCOPY/URETEROSCOPY/HOLMIUM LASER/STENT PLACEMENT;  Surgeon: Hollice Espy, MD;  Location: [facility];   Service: Urology;  Laterality: Bilateral;   EYE SURGERY  2015   tissue biopsy   FOOT SURGERY  2015   JOINT REPLACEMENT Left 2013   hip replacement   LITHOTRIPSY     PTOSIS REPAIR Bilateral 02/18/2016   Procedure: BILATERAL PTOSIS REPAIR UPPER EYELIDS;  Surgeon: Karle Starch, MD;  Location: [facility];  Service: Ophthalmology;  Laterality: Bilateral;  LEAVE PT EARLY AM   thumb surgery Right    TONSILLECTOMY  1973   TOTAL HIP ARTHROPLASTY Right 07/02/2021   Procedure: TOTAL HIP ARTHROPLASTY ANTERIOR APPROACH;  Surgeon: Gaynelle Arabian, MD;  Location: [facility];  Service: Orthopedics;  Laterality: Right;    Family History  Problem Relation Age of Onset   Cancer Father        Abdomen with mastasis   Cancer Mother    Heart disease Mother    Kidney disease Neg Hx    Bladder Cancer Neg Hx    Prostate cancer Neg Hx    Kidney cancer Neg Hx     Social History   Socioeconomic History   Marital status: Divorced    Spouse name: Not on file   Number of children: 1   Years of education: Not on file   Highest education level: Not on file  Occupational History   Occupation: Therapist, 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  Tobacco Use   Smoking status: Former    Packs/day: 1.00    Years: 25.00    Additional pack years: 0.00    Total pack years: 25.00    Types: Cigarettes    Quit date: 2019    Years since quitting: 5.2    Passive exposure: Past   Smokeless tobacco: Never   Tobacco comments:    occasional use  Vaping Use   Vaping Use: Former  Substance and Sexual Activity   Alcohol use: No    Alcohol/week: 0.0 standard drinks of alcohol   Drug use:  Yes    Types: Methylphenidate, Marijuana    Comment: Delta 8 - once per day   Sexual activity: Never  Other Topics Concern   Not on file  Social History Narrative   Not on file   Social Determinants of Health   Financial Resource Strain: Low Risk  (10/01/2022)   Overall Financial Resource Strain (CARDIA)     Difficulty of Paying Living Expenses: Not very hard  Food Insecurity: No Food Insecurity (10/01/2022)   Hunger Vital Sign    Worried About Running Out of Food in the Last Year: Never true    Ran Out of Food in the Last Year: Never true  Transportation Needs: No Transportation Needs (10/01/2022)   PRAPARE - Hydrologist (Medical): No    Lack of Transportation (Non-Medical): No  Physical Activity: Insufficiently Active (10/01/2022)   Exercise Vital Sign    Days of Exercise per Week: 2 days    Minutes of Exercise per Session: 20 min  Stress: No Stress Concern Present (10/01/2022)   [place]    Feeling of Stress : Only a little  Social Connections: Socially Isolated (10/01/2022)   Social Connection and Isolation Panel [NHANES]    Frequency of Communication with Friends and Family: Three times a week    Frequency of Social Gatherings with Friends and Family: Three times a week    Attends Religious Services: Never    Active Member of Clubs or Organizations: No    Attends Archivist Meetings: Never    Marital Status: Divorced  Human resources officer Violence: Not At Risk (10/01/2022)   Humiliation, Afraid, Rape, and Kick questionnaire    Fear of Current or Ex-Partner: No    Emotionally Abused: No    Physically Abused: No    Sexually Abused: No   Review of Systems       Objective:   Physical Exam Constitutional:      Appearance: Normal appearance.  Genitourinary:    Comments: Cystic changes are prominent in left breast and mild on right Both have some tenderness No other masses and no nipple discharge Neurological:     Mental Status: She is alert.            Assessment & Plan:

## 2023-02-08 NOTE — Assessment & Plan Note (Signed)
The pictures look like cyst contents Lots of cysts on right No worrisome mass though Will check diagnostic mammogram/ultrasound

## 2023-02-08 NOTE — Assessment & Plan Note (Signed)
BP Readings from Last 3 Encounters:  02/08/23 98/68  01/28/23 102/69  01/06/23 108/60   On metoprolol 50 bid

## 2023-02-12 ENCOUNTER — Other Ambulatory Visit: Payer: Self-pay | Admitting: Primary Care

## 2023-02-12 ENCOUNTER — Other Ambulatory Visit (HOSPITAL_COMMUNITY): Payer: Self-pay | Admitting: Psychiatry

## 2023-02-12 DIAGNOSIS — E538 Deficiency of other specified B group vitamins: Secondary | ICD-10-CM

## 2023-02-13 NOTE — Telephone Encounter (Signed)
We need to check her B12 levels, lab only appointment, if she wants to continue B12 injections.

## 2023-02-15 NOTE — Telephone Encounter (Signed)
Called and scheduled patient lab appt. 02/23/23

## 2023-02-15 NOTE — Telephone Encounter (Signed)
Unable to reach patient. Unable to leave voicemail, mailbox full.  

## 2023-02-19 ENCOUNTER — Telehealth: Payer: Self-pay

## 2023-02-19 NOTE — Progress Notes (Signed)
Care Management & Coordination Services Pharmacy Team  Reason for Encounter: Patient assistance renewal- Stiolto   Patient is currently enrolled in [facility] Cares patient assistance program for the medication Stiolto until date: 11/16/23 Patient contacted me to report no medication has been received. I called manufacturer and was told that shipping out had been overlooked. The medication Stiolto will go out today in mail. Patient called and informed   Al Corpus, PharmD notified  Burt Knack, [facility] Clinical Pharmacy Assistant [phone]

## 2023-02-22 ENCOUNTER — Telehealth: Payer: Self-pay | Admitting: Primary Care

## 2023-02-22 NOTE — Telephone Encounter (Signed)
Called and left Annabelle Harman a message advising we are not the prescribers for this cream.   Advised she reach out to [facility]  Deirdre Evener, MD  And gave their contact information.

## 2023-02-22 NOTE — Telephone Encounter (Signed)
Dana from [employer] called over and wanted to speak with someone in regards to financial assistance for Ruxolitinib Phosphate (OPZELURA) 1.5 % CREA. She stated that the reference ticket number is [id number]. She can be reached at [phone] ext. [phone]. Thank you!

## 2023-02-23 ENCOUNTER — Other Ambulatory Visit: Payer: PPO

## 2023-02-24 ENCOUNTER — Ambulatory Visit: Payer: PPO | Admitting: Podiatry

## 2023-02-25 ENCOUNTER — Ambulatory Visit
Admission: RE | Admit: 2023-02-25 | Discharge: 2023-02-25 | Disposition: A | Discharge status: Home / Self Care | Payer: PPO | Source: Ambulatory Visit | Attending: Internal Medicine | Admitting: Internal Medicine

## 2023-02-25 DIAGNOSIS — N644 Mastodynia: Secondary | ICD-10-CM | POA: Diagnosis not present

## 2023-02-25 DIAGNOSIS — N6452 Nipple discharge: Secondary | ICD-10-CM

## 2023-03-01 ENCOUNTER — Other Ambulatory Visit: Payer: Self-pay | Admitting: Primary Care

## 2023-03-01 ENCOUNTER — Other Ambulatory Visit: Payer: PPO

## 2023-03-01 DIAGNOSIS — E1165 Type 2 diabetes mellitus with hyperglycemia: Secondary | ICD-10-CM

## 2023-03-03 ENCOUNTER — Other Ambulatory Visit (INDEPENDENT_AMBULATORY_CARE_PROVIDER_SITE_OTHER): Payer: PPO

## 2023-03-03 ENCOUNTER — Ambulatory Visit: Payer: PPO | Admitting: Dermatology

## 2023-03-03 VITALS — BP 90/62

## 2023-03-03 DIAGNOSIS — Z79899 Other long term (current) drug therapy: Secondary | ICD-10-CM

## 2023-03-03 DIAGNOSIS — L503 Dermatographic urticaria: Secondary | ICD-10-CM | POA: Diagnosis not present

## 2023-03-03 DIAGNOSIS — R21 Rash and other nonspecific skin eruption: Secondary | ICD-10-CM | POA: Diagnosis not present

## 2023-03-03 DIAGNOSIS — L304 Erythema intertrigo: Secondary | ICD-10-CM | POA: Diagnosis not present

## 2023-03-03 DIAGNOSIS — Z7189 Other specified counseling: Secondary | ICD-10-CM

## 2023-03-03 DIAGNOSIS — L509 Urticaria, unspecified: Secondary | ICD-10-CM

## 2023-03-03 DIAGNOSIS — E538 Deficiency of other specified B group vitamins: Secondary | ICD-10-CM | POA: Diagnosis not present

## 2023-03-03 MED ORDER — DOXYCYCLINE MONOHYDRATE 100 MG PO CAPS: 100.0000 mg | ORAL_CAPSULE | Freq: Two times a day (BID) | ORAL | 0 refills | Status: DC

## 2023-03-03 NOTE — Progress Notes (Signed)
   Follow-Up Visit   Subjective  Emily Moon is a 62 y.o. 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  Objective  Well appearing patient in no apparent distress; mood and affect are within normal limits. A focused examination was performed of the following areas: Arms, face Relevant exam findings are noted in the Assessment and Plan.   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 Do not lay down for 30 minutes after taking. Be cautious with sun exposure and use good sun protection while on this medication. Pregnant women should not take this medication.    Extensive discussion / counseling.  Difficult condition.  INTERTRIGO Inframammary, lower abdomen Exam Erythematous macerated patches  Intertrigo is a chronic recurrent rash that occurs in skin fold areas that may be associated with friction; heat; moisture; yeast; fungus; and bacteria.  It is exacerbated by increased movement / activity; sweating; and higher  atmospheric temperature.  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  I, Ardis Rowan, RMA, am acting as scribe for Armida Sans, MD .   Documentation: I have reviewed the above documentation for accuracy and completeness, and I agree with the above.  Armida Sans, MD

## 2023-03-03 NOTE — Patient Instructions (Signed)
Due to recent changes in healthcare laws, you may see results of your pathology and/or laboratory studies on MyChart before the doctors have had a chance to review them. We understand that in some cases there may be results that are confusing or concerning to you. Please understand that not all results are received at the same time and often the doctors may need to interpret multiple results in order to provide you with the best plan of care or course of treatment. Therefore, we ask that you please give us 2 business days to thoroughly review all your results before contacting the office for clarification. Should we see a critical lab result, you will be contacted sooner.   If You Need Anything After Your Visit  If you have any questions or concerns for your doctor, please call our main line at [phone] and press option 4 to reach your doctor's medical assistant. If no one answers, please leave a voicemail as directed and we will return your call as soon as possible. Messages left after 4 pm will be answered the following business day.   You may also send us a message via MyChart. We typically respond to MyChart messages within 1-2 business days.  For prescription refills, please ask your pharmacy to contact our office. Our fax number is [phone].  If you have an urgent issue when the clinic is closed that cannot wait until the next business day, you can page your doctor at the number below.    Please note that while we do our best to be available for urgent issues outside of office hours, we are not available 24/7.   If you have an urgent issue and are unable to reach us, you may choose to seek medical care at your doctor's office, retail clinic, urgent care center, or emergency room.  If you have a medical emergency, please immediately call 911 or go to the emergency department.  Pager Numbers  - Dr. Kowalski: [phone]  - Dr. Moye: [phone]  - Dr. Stewart:  [phone]  In the event of inclement weather, please call our main line at [phone] for an update on the status of any delays or closures.  Dermatology Medication Tips: Please keep the boxes that topical medications come in in order to help keep track of the instructions about where and how to use these. Pharmacies typically print the medication instructions only on the boxes and not directly on the medication tubes.   If your medication is too expensive, please contact our office at [phone] option 4 or send us a message through MyChart.   We are unable to tell what your co-pay for medications will be in advance as this is different depending on your insurance coverage. However, we may be able to find a substitute medication at lower cost or fill out paperwork to get insurance to cover a needed medication.   If a prior authorization is required to get your medication covered by your insurance company, please allow us 1-2 business days to complete this process.  Drug prices often vary depending on where the prescription is filled and some pharmacies may offer cheaper prices.  The website www.goodrx.com contains coupons for medications through different pharmacies. The prices here do not account for what the cost may be with help from insurance (it may be cheaper with your insurance), but the website can give you the price if you did not use any insurance.  - You can print the associated coupon and take it with   your prescription to the pharmacy.  - You may also stop by our office during regular business hours and pick up a GoodRx coupon card.  - If you need your prescription sent electronically to a different pharmacy, notify our office through [facility] MyChart or by phone at [phone] option 4.     Si Usted Necesita Algo Despus de Su Visita  Tambin puede enviarnos un mensaje a travs de MyChart. Por lo general respondemos a los mensajes de MyChart en el transcurso de 1 a 2  das hbiles.  Para renovar recetas, por favor pida a su farmacia que se ponga en contacto con nuestra oficina. Nuestro nmero de fax es el [phone].  Si tiene un asunto urgente cuando la clnica est cerrada y que no puede esperar hasta el siguiente da hbil, puede llamar/localizar a su doctor(a) al nmero que aparece a continuacin.   Por favor, tenga en cuenta que aunque hacemos todo lo posible para estar disponibles para asuntos urgentes fuera del horario de oficina, no estamos disponibles las 24 horas del da, los 7 das de la semana.   Si tiene un problema urgente y no puede comunicarse con nosotros, puede optar por buscar atencin mdica  en el consultorio de su doctor(a), en una clnica privada, en un centro de atencin urgente o en una sala de emergencias.  Si tiene una emergencia mdica, por favor llame inmediatamente al 911 o vaya a la sala de emergencias.  Nmeros de bper  - Dr. Kowalski: [phone]  - Dra. Moye: [phone]  - Dra. Stewart: [phone]  En caso de inclemencias del tiempo, por favor llame a nuestra lnea principal al [phone] para una actualizacin sobre el estado de cualquier retraso o cierre.  Consejos para la medicacin en dermatologa: Por favor, guarde las cajas en las que vienen los medicamentos de uso tpico para ayudarle a seguir las instrucciones sobre dnde y cmo usarlos. Las farmacias generalmente imprimen las instrucciones del medicamento slo en las cajas y no directamente en los tubos del medicamento.   Si su medicamento es muy caro, por favor, pngase en contacto con nuestra oficina llamando al [phone] y presione la opcin 4 o envenos un mensaje a travs de MyChart.   No podemos decirle cul ser su copago por los medicamentos por adelantado ya que esto es diferente dependiendo de la cobertura de su seguro. Sin embargo, es posible que podamos encontrar un medicamento sustituto a menor costo o llenar un formulario para que el  seguro cubra el medicamento que se considera necesario.   Si se requiere una autorizacin previa para que su compaa de seguros cubra su medicamento, por favor permtanos de 1 a 2 das hbiles para completar este proceso.  Los precios de los medicamentos varan con frecuencia dependiendo del lugar de dnde se surte la receta y alguna farmacias pueden ofrecer precios ms baratos.  El sitio web www.goodrx.com tiene cupones para medicamentos de diferentes farmacias. Los precios aqu no tienen en cuenta lo que podra costar con la ayuda del seguro (puede ser ms barato con su seguro), pero el sitio web puede darle el precio si no utiliz ningn seguro.  - Puede imprimir el cupn correspondiente y llevarlo con su receta a la farmacia.  - Tambin puede pasar por nuestra oficina durante el horario de atencin regular y recoger una tarjeta de cupones de GoodRx.  - Si necesita que su receta se enve electrnicamente a una farmacia diferente, informe a nuestra oficina a travs de MyChart de [facility]   o por telfono llamando al [phone] y presione la opcin 4.  

## 2023-03-04 LAB — VITAMIN B12: Vitamin B-12: 381 pg/mL (ref 211–911)

## 2023-03-08 ENCOUNTER — Other Ambulatory Visit: Payer: Self-pay | Admitting: Dermatology

## 2023-03-08 ENCOUNTER — Other Ambulatory Visit: Payer: Self-pay | Admitting: Primary Care

## 2023-03-08 DIAGNOSIS — K219 Gastro-esophageal reflux disease without esophagitis: Secondary | ICD-10-CM

## 2023-03-08 DIAGNOSIS — L42 Pityriasis rosea: Secondary | ICD-10-CM

## 2023-03-11 ENCOUNTER — Ambulatory Visit (INDEPENDENT_AMBULATORY_CARE_PROVIDER_SITE_OTHER): Payer: PPO | Admitting: Family Medicine

## 2023-03-11 ENCOUNTER — Encounter (INDEPENDENT_AMBULATORY_CARE_PROVIDER_SITE_OTHER): Payer: Self-pay | Admitting: Family Medicine

## 2023-03-11 VITALS — BP 104/70 | HR 78 | Temp 98.9°F | Ht 68.0 in | Wt 245.0 lb

## 2023-03-11 DIAGNOSIS — Z7984 Long term (current) use of oral hypoglycemic drugs: Secondary | ICD-10-CM | POA: Diagnosis not present

## 2023-03-11 DIAGNOSIS — Z7985 Long-term (current) use of injectable non-insulin antidiabetic drugs: Secondary | ICD-10-CM | POA: Diagnosis not present

## 2023-03-11 DIAGNOSIS — F3170 Bipolar disorder, currently in remission, most recent episode unspecified: Secondary | ICD-10-CM

## 2023-03-11 DIAGNOSIS — Z6837 Body mass index (BMI) 37.0-37.9, adult: Secondary | ICD-10-CM

## 2023-03-11 DIAGNOSIS — G35 Multiple sclerosis: Secondary | ICD-10-CM | POA: Diagnosis not present

## 2023-03-11 DIAGNOSIS — E1159 Type 2 diabetes mellitus with other circulatory complications: Secondary | ICD-10-CM | POA: Diagnosis not present

## 2023-03-11 DIAGNOSIS — Z0289 Encounter for other administrative examinations: Secondary | ICD-10-CM

## 2023-03-11 NOTE — Assessment & Plan Note (Signed)
She reports good compliance with taking her mood medications. She reports a traumatic experience in 03/18/2018 where her sister died in her arms and this was the start of her weight gain, up from 180 lb.  She is seeing behavioral health.  Consider CBT with Dr Dewaine Conger as part of our plan of care Focus on self care and stress reduction.

## 2023-03-11 NOTE — Assessment & Plan Note (Signed)
Reviewed program information and patient's current goals She is a picky eater and has limited finances and ability to stand much to cook.  Will bring her back for fasting IC and labs Reduce frequency of restaurant meals

## 2023-03-11 NOTE — Progress Notes (Signed)
Office: [phone]  /  Fax: [phone]   Initial Visit  Emily Moon was seen in clinic today to evaluate for obesity. She is interested in losing weight to improve overall health and reduce the risk of weight related complications. She presents today to review program treatment options, initial physical assessment, and evaluation.     She was referred by: PCP  When asked what else they would like to accomplish? She states: Adopt healthier eating patterns, Improve existing medical conditions, and Improve quality of life  Weight history:  started to gain in 30-Mar-2018 when sister died, then covid, MS Tired all the time; maintained her weight at 180 lb  When asked how has your weight affected you? She states: Contributed to medical problems, Contributed to orthopedic problems or mobility issues, Having fatigue, and Having poor endurance  Some associated conditions: Hyperlipidemia, Diabetes, and Lung disease  Contributing factors: Family history, Nutritional, Stress, Reduced physical activity, Eating patterns, Mental health problems, and Other: MS with weakness  Weight promoting medications identified: None  Current nutrition plan: None  Current level of physical activity: Limited due to chronic pain or orthopedic problems  Current or previous pharmacotherapy: GLP-1  Response to medication: Had side effects so it was discontinued   Past medical history includes:   Past Medical History:  Diagnosis Date   Acute pyelonephritis 12/25/2021   Arthritis    osteo   Asthma    Bipolar disorder 05/21/2014   Cataracts, bilateral    COPD (chronic obstructive pulmonary disease)    DDD (degenerative disc disease), cervical    also back   Depression    Diabetes mellitus without complication    Dizziness    Positional   Edema    feet/legs   Fibromyalgia syndrome    Fungal infection    Finger nails   GERD (gastroesophageal reflux disease)    Gout    Heart palpitations    Hip  dysplasia, 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   History of kidney stones    Hypercholesterolemia    IDA (iron deficiency anemia)    Multiple sclerosis    weakness   Osteoporosis    osteoarthritis   Pneumonia    PONV (postoperative nausea and vomiting)    no problem after cataract surgery   Prediabetes 07/22/2018   Psoriasis    Shortness of breath dyspnea    wheezing   Sleep apnea Mar 31, 2011   sleep study / slight, no interventions   Urinary frequency    Weight gain 06/21/2014     Objective:   BP 104/70   Pulse 78   Temp 98.9 F (37.2 C)   Ht  (1.727 m)   Wt 245 lb (111.1 kg)   LMP 08/23/2014   SpO2 100%   BMI 37.25 kg/m  She was weighed on the bioimpedance scale: Body mass index is 37.25 kg/m.  Peak Weight: 245 lb , Body Fat%:45.5, Visceral Fat Rating:14, Weight trend over the last 12 months: Increasing  General:  Alert, oriented and cooperative. Patient is in no acute distress.  Respiratory: Normal respiratory effort, no problems with respiration noted   Gait: able to ambulate independently  Mental Status: Normal mood and affect. Normal behavior. Normal judgment and thought content.   DIAGNOSTIC DATA REVIEWED:  BMET    Component Value Date/Time   NA 141 08/20/2022 1508   K 4.1 08/20/2022 1508   CL 103 08/20/2022 1508   CO2 28 08/20/2022 1508   GLUCOSE 133 (H) 08/20/2022 1508   BUN 16  08/20/2022 1508   CREATININE 1.10 08/20/2022 1508   CALCIUM 9.0 08/20/2022 1508   GFRNONAA >60 03/23/2022 1148   GFRAA >60 02/19/2020 1553   Lab Results  Component Value Date   HGBA1C 6.2 (A) 12/03/2022   HGBA1C 6.0 03/24/2016   No results found for: "INSULIN" CBC    Component Value Date/Time   WBC 7.5 12/15/2022 1514   RBC 4.30 12/15/2022 1514   HGB 13.3 12/15/2022 1514   HCT 39.9 12/15/2022 1514   PLT 434 (H) 12/15/2022 1514   MCV 92.8 12/15/2022 1514   MCH 30.9 12/15/2022 1514   MCHC 33.3 12/15/2022 1514   RDW 12.7 12/15/2022 1514   Iron/TIBC/Ferritin/ %Sat     Component Value Date/Time   IRON 64 12/15/2022 1514   TIBC 470 (H) 12/15/2022 1514   FERRITIN 60 12/15/2022 1514   IRONPCTSAT 14 12/15/2022 1514   Lipid Panel     Component Value Date/Time   CHOL 146 08/20/2022 1508   TRIG 196.0 (H) 08/20/2022 1508   HDL 40.50 08/20/2022 1508   CHOLHDL 4 08/20/2022 1508   VLDL 39.2 08/20/2022 1508   LDLCALC 67 08/20/2022 1508   LDLDIRECT 53.0 08/01/2020 1254   Hepatic Function Panel     Component Value Date/Time   PROT 6.4 08/20/2022 1508   ALBUMIN 4.2 08/20/2022 1508   AST 9 08/20/2022 1508   ALT 10 08/20/2022 1508   ALKPHOS 43 08/20/2022 1508   BILITOT 0.3 08/20/2022 1508   BILIDIR 0.1 11/24/2017 1105      Component Value Date/Time   TSH 0.902 02/19/2020 1553   TSH 1.31 05/10/2019 1434     Assessment and Plan:   Multiple sclerosis, progressive relapsing Assessment & Plan: Managed by Dr Malvin Johns at [facility].  Has limited walking, using a rollator.   She hopes to improve her ability to ambulate with weight reduction    Morbid obesity Assessment & Plan: Reviewed program information and patient's current goals She is a picky eater and has limited finances and ability to stand much to cook.  Will bring her back for fasting IC and labs Reduce frequency of restaurant meals   BMI 37.0-37.9, adult  Type 2 diabetes mellitus with other circulatory complication, without long-term current use of insulin Assessment & Plan: Lab Results  Component Value Date   HGBA1C 6.2 (A) 12/03/2022   Patient is currently on Trulicity 3 mg weekly injection + glipizide XL 10 mg daily.  She denies hypoglycemia and monitors with a CGM.  She had suicidal ideations from Ozempic.  She consumes mostly processed foods/ refined carbs with little physical activity.  Plan: continue current meds per PCP.  Plans to add in more lean protein and fiber with meals while reducing added sugar and refined carbs.  Think about options for physical activity.   Bipolar  affective disorder in remission Assessment & Plan: She reports good compliance with taking her mood medications. She reports a traumatic experience in 04/12/18 where her sister died in her arms and this was the start of her weight gain, up from 180 lb.  She is seeing behavioral health.  Consider CBT with Dr Dewaine Conger as part of our plan of care Focus on self care and stress reduction.         Obesity Treatment / Action Plan:  Patient will work on garnering support from family and friends to begin weight loss journey. Will work on eliminating or reducing the presence of highly palatable, calorie dense foods in the home. Will complete  provided nutritional and psychosocial assessment questionnaire before the next appointment. Will be scheduled for indirect calorimetry to determine resting energy expenditure in a fasting state.  This will allow [place] to create a reduced calorie, high-protein meal plan to promote loss of fat mass while preserving muscle mass. Will think about ideas on how to incorporate physical activity into their daily routine. Will reduce the frequency of eating out and making healthier choices by advanced menu planning. Was counseled on nutritional approaches to weight loss and benefits of complex carbs and high quality protein as part of nutritional weight management.  Obesity Education Performed Today:  She was weighed on the bioimpedance scale and results were discussed and documented in the synopsis.  We discussed obesity as a disease and the importance of a more detailed evaluation of all the factors contributing to the disease.  We discussed the importance of long term lifestyle changes which include nutrition, exercise and behavioral modifications as well as the importance of customizing this to her specific health and social needs.  We discussed the benefits of reaching a healthier weight to alleviate the symptoms of existing conditions and reduce the risks of the  biomechanical, metabolic and psychological effects of obesity.  Emily Moon appears to be in the action stage of change and states they are ready to start intensive lifestyle modifications and behavioral modifications.  30 minutes was spent today on this visit including the above counseling, pre-visit chart review, and post-visit documentation.  Reviewed by clinician on day of visit: allergies, medications, problem list, medical history, surgical history, family history, social history, and previous encounter notes pertinent to obesity diagnosis.    Seymour Bars, D.O. DABFM, DABOM [facility] [phone] [place], [place] [address] [phone]

## 2023-03-11 NOTE — Addendum Note (Signed)
Addended by: Glennis Brink on: 03/11/2023 04:12 PM   Modules accepted: Level of Service

## 2023-03-11 NOTE — Assessment & Plan Note (Signed)
Managed by Dr Malvin Johns at [facility].  Has limited walking, using a rollator.   She hopes to improve her ability to ambulate with weight reduction

## 2023-03-11 NOTE — Assessment & Plan Note (Signed)
Lab Results  Component Value Date   HGBA1C 6.2 (A) 12/03/2022   Patient is currently on Trulicity 3 mg weekly injection + glipizide XL 10 mg daily.  She denies hypoglycemia and monitors with a CGM.  She had suicidal ideations from Ozempic.  She consumes mostly processed foods/ refined carbs with little physical activity.  Plan: continue current meds per PCP.  Plans to add in more lean protein and fiber with meals while reducing added sugar and refined carbs.  Think about options for physical activity.

## 2023-03-12 ENCOUNTER — Ambulatory Visit: Payer: PPO | Admitting: Urology

## 2023-03-14 DIAGNOSIS — E538 Deficiency of other specified B group vitamins: Secondary | ICD-10-CM

## 2023-03-15 ENCOUNTER — Encounter: Payer: Self-pay | Admitting: Dermatology

## 2023-03-15 ENCOUNTER — Other Ambulatory Visit: Payer: Self-pay | Admitting: Primary Care

## 2023-03-15 ENCOUNTER — Encounter: Payer: Self-pay | Admitting: Internal Medicine

## 2023-03-15 DIAGNOSIS — E1165 Type 2 diabetes mellitus with hyperglycemia: Secondary | ICD-10-CM

## 2023-03-15 DIAGNOSIS — R413 Other amnesia: Secondary | ICD-10-CM | POA: Diagnosis not present

## 2023-03-15 DIAGNOSIS — R200 Anesthesia of skin: Secondary | ICD-10-CM | POA: Diagnosis not present

## 2023-03-15 DIAGNOSIS — G35 Multiple sclerosis: Secondary | ICD-10-CM | POA: Diagnosis not present

## 2023-03-15 DIAGNOSIS — R5383 Other fatigue: Secondary | ICD-10-CM | POA: Diagnosis not present

## 2023-03-15 DIAGNOSIS — R6889 Other general symptoms and signs: Secondary | ICD-10-CM | POA: Diagnosis not present

## 2023-03-15 DIAGNOSIS — R531 Weakness: Secondary | ICD-10-CM | POA: Diagnosis not present

## 2023-03-15 MED ORDER — CYANOCOBALAMIN 1000 MCG/ML IJ SOLN
INTRAMUSCULAR | 0 refills | Status: DC
Start: 2023-03-15 — End: 2023-06-13

## 2023-03-16 ENCOUNTER — Other Ambulatory Visit (HOSPITAL_COMMUNITY): Payer: Self-pay | Admitting: *Deleted

## 2023-03-19 ENCOUNTER — Telehealth: Payer: Self-pay | Admitting: Podiatry

## 2023-03-19 NOTE — Telephone Encounter (Signed)
Pt left message this morning at 618am stating she got our message that appt was changed to [place] and she is not able to come to [place] and needs a afternoon or a Friday appt.  I have rescheduled her for 5.20 in [place] at 245pm with Dr Al Corpus and left message for pt to call to confirm.

## 2023-03-21 ENCOUNTER — Telehealth: Payer: Self-pay | Admitting: Urology

## 2023-03-21 NOTE — Telephone Encounter (Signed)
Please call Emily Moon and schedule her for a lab appointment so we can recheck her urine to make sure the blood in the urine cleared with the treatment of her infection.

## 2023-03-22 ENCOUNTER — Ambulatory Visit: Payer: PPO | Admitting: Podiatry

## 2023-03-23 ENCOUNTER — Ambulatory Visit (INDEPENDENT_AMBULATORY_CARE_PROVIDER_SITE_OTHER): Payer: PPO | Admitting: Internal Medicine

## 2023-03-23 ENCOUNTER — Encounter (INDEPENDENT_AMBULATORY_CARE_PROVIDER_SITE_OTHER): Payer: Self-pay | Admitting: Internal Medicine

## 2023-03-23 VITALS — BP 115/75 | HR 87 | Temp 98.6°F | Ht 68.0 in | Wt 247.0 lb

## 2023-03-23 DIAGNOSIS — G35 Multiple sclerosis: Secondary | ICD-10-CM | POA: Diagnosis not present

## 2023-03-23 DIAGNOSIS — Z6837 Body mass index (BMI) 37.0-37.9, adult: Secondary | ICD-10-CM | POA: Diagnosis not present

## 2023-03-23 DIAGNOSIS — Z7984 Long term (current) use of oral hypoglycemic drugs: Secondary | ICD-10-CM

## 2023-03-23 DIAGNOSIS — E785 Hyperlipidemia, unspecified: Secondary | ICD-10-CM | POA: Diagnosis not present

## 2023-03-23 DIAGNOSIS — R5383 Other fatigue: Secondary | ICD-10-CM

## 2023-03-23 DIAGNOSIS — G4733 Obstructive sleep apnea (adult) (pediatric): Secondary | ICD-10-CM | POA: Diagnosis not present

## 2023-03-23 DIAGNOSIS — F32A Depression, unspecified: Secondary | ICD-10-CM

## 2023-03-23 DIAGNOSIS — R0602 Shortness of breath: Secondary | ICD-10-CM | POA: Insufficient documentation

## 2023-03-23 DIAGNOSIS — E1159 Type 2 diabetes mellitus with other circulatory complications: Secondary | ICD-10-CM | POA: Diagnosis not present

## 2023-03-23 DIAGNOSIS — Z7985 Long-term (current) use of injectable non-insulin antidiabetic drugs: Secondary | ICD-10-CM

## 2023-03-23 DIAGNOSIS — Z1331 Encounter for screening for depression: Secondary | ICD-10-CM | POA: Insufficient documentation

## 2023-03-23 NOTE — Assessment & Plan Note (Signed)
HgbA1c is at goal for age and comorbid conditions. Denies symptoms of hypoglycemia or hyperglycemia.  She has CGM and blood sugars have been well-controlled.  On glipizide and Trulicity with good adherence and no side effects.  Glipizide may cause weight gain.  She was on Ozempic in the past but because of mental health medication was discontinued.  Counseled on goals of care, monitoring for complications and importance of staying updated on immunizations and diabetes preventive measures. Continue with reduced calorie meal plan low on processed crabs and simple sugars. Ongoing weight loss will improve insulin resistance and glycemic control  Lab Results  Component Value Date   HGBA1C 6.2 (A) 12/03/2022   HGBA1C 6.9 (H) 08/20/2022   HGBA1C 6.5 (A) 04/01/2022   Lab Results  Component Value Date   MICROALBUR <0.7 08/20/2022   LDLCALC 67 08/20/2022   CREATININE 1.10 08/20/2022

## 2023-03-23 NOTE — Assessment & Plan Note (Signed)
LDL is at goal. Elevated LDL may be secondary to nutrition, genetics and spillover effect from excess adiposity. Recommended LDL goal is <70 to reduce the risk of fatty streaks and the progression to obstructive ASCVD in the future. Her 10 year risk is: The 10-year ASCVD risk score (Arnett DK, et al., 2019) is: 6.6%  She is on fenofibrate and Repatha.  Lab Results  Component Value Date   CHOL 146 08/20/2022   HDL 40.50 08/20/2022   LDLCALC 67 08/20/2022   LDLDIRECT 53.0 08/01/2020   TRIG 196.0 (H) 08/20/2022   CHOLHDL 4 08/20/2022    Continue weight loss therapy, losing 10% or more of body weight may improve condition. Also advised to reduce saturated fats in diet to less than 10% of daily calories.

## 2023-03-23 NOTE — Assessment & Plan Note (Signed)
Patient reports good adherence without any daytime somnolence.  Continue PAP therapy.  Losing 15% of body weight may reduce AHI.

## 2023-03-23 NOTE — Progress Notes (Signed)
Chief Complaint:   OBESITY Emily Moon (MR# [id number]) is a 62 y.o. female who presents for evaluation and treatment of obesity and related comorbidities. Current BMI is Body mass index is 37.56 kg/m. Emily Moon has been struggling with her weight for many years and has been unsuccessful in either losing weight, maintaining weight loss, or reaching her healthy weight goal.  Emily Moon is currently in the action stage of change and ready to dedicate time achieving and maintaining a healthier weight. Emily Moon is interested in becoming our patient and working on intensive lifestyle modifications including (but not limited to) diet and exercise for weight loss.  Emily Moon's habits were reviewed today and are as follows: her desired weight loss is 67 lbs, she started gaining weight in 2020-2021, her heaviest weight ever was 245 pounds, she has significant food cravings issues, she snacks frequently in the evenings, she skips meals frequently, she is frequently drinking liquids with calories, she frequently makes poor food choices, she frequently eats larger portions than normal, and she struggles with emotional eating.  Depression Screen Emily Moon's Food and Mood (modified PHQ-9) score was 19.    Subjective:   1. Other fatigue [name] denies daytime somnolence and admits to waking up still tired. Patient has a history of symptoms of morning fatigue. [name] generally gets 8 or 10 hours of sleep per night, and states that she has nightime awakenings and generally restful sleep. Snoring is not present. Apneic episodes are present. Epworth Sleepiness Score is 0.   2. SOB (shortness of breath) on exertion Emily Moon notes increasing shortness of breath with exercising and seems to be worsening over time with weight gain. She notes getting out of breath sooner with activity than she used to. This has not gotten worse recently. Emily Moon denies shortness of breath at rest or orthopnea.  3. Type 2 diabetes  mellitus with other circulatory complication, without long-term current use of insulin (HCC) HgbA1c is at goal for age and comorbid conditions. Denies symptoms of hypoglycemia or hyperglycemia.  She has CGM and blood sugars have been well-controlled.  On glipizide and Trulicity with good adherence and no side effects.  Glipizide may cause weight gain.  She was on Ozempic in the past but because of mental health medication was discontinued.  Lab Results  Component Value Date   HGBA1C 6.2 (A) 12/03/2022   HGBA1C 6.9 (H) 08/20/2022   HGBA1C 6.5 (A) 04/01/2022   Lab Results  Component Value Date   MICROALBUR <0.7 08/20/2022   LDLCALC 67 08/20/2022   CREATININE 1.10 08/20/2022   4. Hyperlipidemia, unspecified hyperlipidemia type LDL is at goal. Elevated LDL may be secondary to nutrition, genetics and spillover effect from excess adiposity. Recommended LDL goal is <70 to reduce the risk of fatty streaks and the progression to obstructive ASCVD in the future. Her 10 year risk is: The 10-year ASCVD risk score (Arnett DK, et al., 2019) is: 6.6%. She is on fenofibrate and Repatha.  Lab Results  Component Value Date   CHOL 146 08/20/2022   HDL 40.50 08/20/2022   LDLCALC 67 08/20/2022   LDLDIRECT 53.0 08/01/2020   TRIG 196.0 (H) 08/20/2022   CHOLHDL 4 08/20/2022   5. Multiple sclerosis, progressive relapsing Affecting energy levels, weight and ability to prepare meals.  6. Obstructive sleep apnea syndrome Patient reports good adherence without any daytime somnolence.  Assessment/Plan:   1. Other fatigue Emily Moon does feel that her weight is causing her energy to be lower than it should be. Fatigue  may be related to obesity, depression or many other causes. Labs will be ordered, and in the meanwhile, Emily Moon will focus on self care including making healthy food choices, increasing physical activity and focusing on stress reduction.  2. SOB (shortness of breath) on exertion Emily Moon does feel  that she gets out of breath more easily that she used to when she exercises. Emily Moon's shortness of breath appears to be obesity related and exercise induced. She has agreed to work on weight loss and gradually increase exercise to treat her exercise induced shortness of breath. Will continue to monitor closely.  3. Type 2 diabetes mellitus with other circulatory complication, without long-term current use of insulin (HCC) Counseled on goals of care, monitoring for complications and importance of staying updated on immunizations and diabetes preventive measures. Continue with reduced calorie meal plan low on processed crabs and simple sugars. Ongoing weight loss will improve insulin resistance and glycemic control.  - Hemoglobin A1c - Insulin, random  4. Hyperlipidemia, unspecified hyperlipidemia type Continue weight loss therapy, losing 10% or more of body weight may improve condition. Also advised to reduce saturated fats in diet to less than 10% of daily calories.    - Comprehensive metabolic panel - Lipid Panel With LDL/HDL Ratio  5. Multiple sclerosis, progressive relapsing Review treatment history at a future office visit.  She has OSA which is associated with high body weight and MS.  6. Obstructive sleep apnea syndrome Continue PAP therapy.  Losing 15% of body weight may reduce AHI.  7. Depression screen Emily Moon had a positive depression screening. Depression is commonly associated with obesity and often results in emotional eating behaviors. We will monitor this closely and work on CBT to help improve the non-hunger eating patterns. Referral to Psychology may be required if no improvement is seen as she continues in our clinic.  8. Class 2 severe obesity with serious comorbidity and body mass index (BMI) of 37.0 to 37.9 in adult, unspecified obesity type (HCC) - TSH - VITAMIN D 25 Hydroxy (Vit-D Deficiency, Fractures)  Emily Moon is currently in the action stage of change and her  goal is to continue with weight loss efforts. I recommend Emily Moon begin the structured treatment plan as follows:  She has agreed to the Category 2 Plan + 100 calories.  Exercise goals: All adults should avoid inactivity. Some physical activity is better than none, and adults who participate in any amount of physical activity gain some health benefits.   Behavioral modification strategies: increasing lean protein intake, decreasing simple carbohydrates, increasing vegetables, increasing water intake, decreasing eating out, no skipping meals, meal planning and cooking strategies, keeping healthy foods in the home, better snacking choices, and planning for success.  She was informed of the importance of frequent follow-up visits to maximize her success with intensive lifestyle modifications for her multiple health conditions. She was informed we would discuss her lab results at her next visit unless there is a critical issue that needs to be addressed sooner. Emily Moon agreed to keep her next visit at the agreed upon time to discuss these results.  Objective:   Blood pressure 115/75, pulse 87, temperature 98.6 F (37 C), height 5\' 8"  (1.727 m), weight 247 lb (112 kg), last menstrual period 08/23/2014, SpO2 92 %. Body mass index is 37.56 kg/m.  EKG: Normal sinus rhythm, rate 87 BPM.  Indirect Calorimeter completed today shows a VO2 of 311 and a REE of 2146.  Her calculated basal metabolic rate is 0981 thus her basal metabolic  rate is better than expected.  General: Cooperative, alert, well developed, in no acute distress. HEENT: Conjunctivae and lids unremarkable. Cardiovascular: Regular rhythm.  Lungs: Normal work of breathing. Neurologic: No focal deficits.   Lab Results  Component Value Date   CREATININE 1.10 08/20/2022   BUN 16 08/20/2022   NA 141 08/20/2022   K 4.1 08/20/2022   CL 103 08/20/2022   CO2 28 08/20/2022   Lab Results  Component Value Date   ALT 10 08/20/2022   AST 9  08/20/2022   ALKPHOS 43 08/20/2022   BILITOT 0.3 08/20/2022   Lab Results  Component Value Date   HGBA1C 6.2 (A) 12/03/2022   HGBA1C 6.9 (H) 08/20/2022   HGBA1C 6.5 (A) 04/01/2022   HGBA1C 6.7 (A) 11/04/2021   HGBA1C 6.6 (H) 07/02/2021   No results found for: "INSULIN" Lab Results  Component Value Date   TSH 0.902 02/19/2020   Lab Results  Component Value Date   CHOL 146 08/20/2022   HDL 40.50 08/20/2022   LDLCALC 67 08/20/2022   LDLDIRECT 53.0 08/01/2020   TRIG 196.0 (H) 08/20/2022   CHOLHDL 4 08/20/2022   Lab Results  Component Value Date   WBC 7.5 12/15/2022   HGB 13.3 12/15/2022   HCT 39.9 12/15/2022   MCV 92.8 12/15/2022   PLT 434 (H) 12/15/2022   Lab Results  Component Value Date   IRON 64 12/15/2022   TIBC 470 (H) 12/15/2022   FERRITIN 60 12/15/2022   Attestation Statements:   Reviewed by clinician on day of visit: allergies, medications, problem list, medical history, surgical history, family history, social history, and previous encounter notes.  Time spent on visit including pre-visit chart review and post-visit charting and care was 40 minutes.   Trude Mcburney, am acting as transcriptionist for Worthy Rancher, MD.  I have reviewed the above documentation for accuracy and completeness, and I agree with the above. -Worthy Rancher, MD

## 2023-03-23 NOTE — Assessment & Plan Note (Addendum)
Affecting energy levels, weight and ability to prepare meals.  Review treatment history at a future office visit.  She has OSA which is associated with high body weight and MS.

## 2023-03-24 ENCOUNTER — Encounter: Payer: Self-pay | Admitting: Oncology

## 2023-03-24 ENCOUNTER — Encounter: Payer: Self-pay | Admitting: Internal Medicine

## 2023-03-24 ENCOUNTER — Ambulatory Visit: Payer: PPO | Admitting: Internal Medicine

## 2023-03-24 VITALS — BP 120/70 | HR 104 | Temp 97.6°F | Ht 68.0 in | Wt 247.0 lb

## 2023-03-24 DIAGNOSIS — G4733 Obstructive sleep apnea (adult) (pediatric): Secondary | ICD-10-CM | POA: Diagnosis not present

## 2023-03-24 DIAGNOSIS — J4489 Other specified chronic obstructive pulmonary disease: Secondary | ICD-10-CM

## 2023-03-24 LAB — COMPREHENSIVE METABOLIC PANEL
ALT: 11 IU/L (ref 0–32)
AST: 10 IU/L (ref 0–40)
Albumin/Globulin Ratio: 1.7 (ref 1.2–2.2)
Albumin: 4.3 g/dL (ref 3.8–4.9)
Alkaline Phosphatase: 49 IU/L (ref 44–121)
BUN/Creatinine Ratio: 22 (ref 12–28)
BUN: 22 mg/dL (ref 8–27)
Bilirubin Total: 0.4 mg/dL (ref 0.0–1.2)
CO2: 23 mmol/L (ref 20–29)
Calcium: 9 mg/dL (ref 8.7–10.3)
Chloride: 97 mmol/L (ref 96–106)
Creatinine, Ser: 0.98 mg/dL (ref 0.57–1.00)
Globulin, Total: 2.5 g/dL (ref 1.5–4.5)
Glucose: 90 mg/dL (ref 70–99)
Potassium: 4.1 mmol/L (ref 3.5–5.2)
Sodium: 139 mmol/L (ref 134–144)
Total Protein: 6.8 g/dL (ref 6.0–8.5)
eGFR: 66 mL/min/{1.73_m2} (ref >59)

## 2023-03-24 LAB — INSULIN, RANDOM: INSULIN: <0.4 u[IU]/mL — ABNORMAL LOW (ref 2.6–24.9)

## 2023-03-24 LAB — HEMOGLOBIN A1C
Est. average glucose Bld gHb Est-mCnc: 143 mg/dL
Hgb A1c MFr Bld: 6.6 % — ABNORMAL HIGH (ref 4.8–5.6)

## 2023-03-24 LAB — VITAMIN D 25 HYDROXY (VIT D DEFICIENCY, FRACTURES): Vit D, 25-Hydroxy: 95.8 ng/mL (ref 30.0–100.0)

## 2023-03-24 LAB — LIPID PANEL WITH LDL/HDL RATIO
Cholesterol, Total: 122 mg/dL (ref 100–199)
HDL: 39 mg/dL — ABNORMAL LOW (ref >39)
LDL Chol Calc (NIH): 56 mg/dL (ref 0–99)
LDL/HDL Ratio: 1.4 ratio (ref 0.0–3.2)
Triglycerides: 155 mg/dL — ABNORMAL HIGH (ref 0–149)
VLDL Cholesterol Cal: 27 mg/dL (ref 5–40)

## 2023-03-24 LAB — TSH: TSH: 4.86 u[IU]/mL — ABNORMAL HIGH (ref 0.450–4.500)

## 2023-03-24 NOTE — Patient Instructions (Signed)
Keep up the great work A+ Continue therapy for sleep apnea Use inhalers as prescribed  Avoid secondhand smoke Avoid SICK contacts Recommend  Masking  when appropriate Recommend Keep up-to-date with vaccinations

## 2023-03-24 NOTE — Progress Notes (Signed)
@Patient  ID: Emily Moon, female    DOB: Mar 10, 1961, 62 y.o.   MRN: [id number]  CC Follow up OSA Follow up COPD/ASTHMA   HPI: 62 yo female former smoker followed for OSA and COPD with asthma  Medical history significant for multiple sclerosis  TEST/EVENTS :  PFT April 29, 2020 that showed moderate restriction and airflow obstruction with FEV1 at 63%, ratio 72, FVC 68%, positive bronchodilator response.  DLCO 116%.    06/2020 CPAP ttiration showed CPAP 14 cm with med full fac emask (no need for oxygen or BIPAP )   03/24/2023 Follow up : OSA , COPD w/ Asthma, Preop pulmonary risk assessment  Patient presents for 6 month follow-up.  She has underlying moderate COPD with asthma.  She says overall breathing is doing okay.  She does get short of breath with minimum activities and has some episodes where she feels that she panics with her breathing.  She is unclear if this has to do with her underlying anxiety.  She uses albuterol and does feel better.  She has not been taking her Symbicort on a consistent basis.  Says that she sometimes forgets it.  But does feel that it helps her to breathe better.  We talked about inhaler compliance.  Patient remains independent.  Lives at home.  Is able to do her house chores.  She is having more difficulty doing her activities because of her right hip.  She has plans for upcoming hip surgery next week.Marland Kitchen  She wants to be more active.  Patient has underlying obstructive sleep apnea.  Says she is doing well on CPAP.  She does not have any issues.  She wears her CPAP each night.  Usually gets about 5 to 6 hours of sleep.  CPAP download shows excellent compliance.  Daily average usage at 5.5 hours.  AHI 1.0.  Patient is scheduled for right hip surgery next week.  Needs preop pulmonary risk assessment. Patient is not on oxygen.  Remains independent at home.  Is able to drop.  Do her own housework.  She has had no recent flare of asthma or COPD.  No recent  antibiotics or steroids.  Allergies  Allergen Reactions   Albuterol Shortness Of Breath and Other (See Comments)    Makes pt feel jittery/ tacycardic   Crestor [Rosuvastatin] Other (See Comments)    Joint pain, muscle pain, and hair loss   Halcion [Triazolam] Other (See Comments)    Dizziness,headaches,bladder problems   Levaquin [Levofloxacin In D5w] Diarrhea and Itching    Shoulder pain   Metformin And Related Rash   Naproxen Sodium     Patient tolerates in small doses. Her reaction is swelling of the lower extremities.    Ozempic (0.25 Or 0.5 Mg-Dose) [Semaglutide(0.25 Or 0.5mg -Dos)] Itching   Cefaclor Other (See Comments)    Doesn't remember---unsure if actually allergic    Sulfa Antibiotics Itching    Unsure of reaction possibly itching   Tramadol Itching and Nausea And Vomiting   Zinc Other (See Comments)    constipation  constipation    Aripiprazole Other (See Comments)    Muscle tension/cramping   Diclofenac Sodium Rash    "made very sick"    Immunization History  Administered Date(s) Administered   Influenza Split 08/05/2011   Influenza, Seasonal, Injecte, Preservative Fre 06/26/2018   Influenza,inj,Quad PF,6+ Mos 08/17/2013, 07/13/2015, 07/06/2017, 06/26/2018, 06/26/2019, 07/04/2020, 09/03/2021   Influenza-Unspecified 07/08/2016, 06/18/2017, 06/26/2018, 08/06/2022   PFIZER(Purple Top)SARS-COV-2 Vaccination 02/06/2020, 02/28/2020, 07/01/2020, 03/19/2021  [employer], [employer] 10yrs & up 09/03/2021   Pneumococcal Conjugate-13 07/05/2014   Pneumococcal Polysaccharide-23 07/13/2015    Past Medical History:  Diagnosis Date   Acute pyelonephritis 12/25/2021   Arthritis    osteo   Asthma    Bipolar disorder (HCC) 05/21/2014   Cataracts, bilateral    Cervical radiculitis    Chest pain    Chronic pain syndrome    COPD (chronic obstructive pulmonary disease) (HCC)    DDD (degenerative disc disease), cervical    also back   Depression     Diabetes mellitus without complication (HCC)    Dizziness    Positional   Edema    feet/legs   Epigastric pain    Fibromyalgia syndrome    Fungal infection    Finger nails   GERD (gastroesophageal reflux disease)    Gout    Heart palpitations    Hip dysplasia, congenital 09/15/2013   History of kidney stones    Hx of sepsis    Hypercholesterolemia    IDA (iron deficiency anemia)    Multiple sclerosis (HCC)    weakness   Osteoporosis    osteoarthritis   Pneumonia    PONV (postoperative nausea and vomiting)    no problem after cataract surgery   Prediabetes 07/22/2018   Psoriasis    Sciatica    Seasonal allergies    Shortness of breath dyspnea    wheezing   Sleep apnea 2012   sleep study / slight, no interventions   Urinary frequency    Vitamin B 12 deficiency    Weight gain 06/21/2014    Tobacco History: Social History   Tobacco Use  Smoking Status Former   Packs/day: 1.00   Years: 25.00   Additional pack years: 0.00   Total pack years: 25.00   Types: Cigarettes   Quit date: 2019   Years since quitting: 5.3   Passive exposure: Past  Smokeless Tobacco Never  Tobacco Comments   occasional use   Counseling given: Not Answered Tobacco comments: occasional use    Outpatient Medications Prior to Visit  Medication Sig Dispense Refill   aspirin EC 81 MG tablet Take 81 mg by mouth at bedtime. Swallow whole.     Azelastine HCl 137 MCG/SPRAY SOLN PLACE 2 SPRAYS INTO BOTH NOSTRILS 2 (TWO) TIMES DAILY 30 mL 2   B Complex-Biotin-FA (BIG 100, BIOTIN, PO) Take 300 mg by mouth at bedtime.     bisacodyl (DULCOLAX) 5 MG EC tablet Take 5 mg by mouth daily as needed for moderate constipation.     cetirizine (ZYRTEC) 10 MG tablet Take 10 mg by mouth 2 (two) times daily.     clindamycin (CLEOCIN T) 1 % external solution Apply to scalp once or twice a day prn bumps 30 mL 2   Continuous Glucose Sensor (FREESTYLE LIBRE 3 SENSOR) MISC PLACE 1 SENSOR ON THE SKIN EVERY 14 DAYS.  USE TO CHECK GLUCOSE CONTINUOUSLY 6 each 1   cyanocobalamin (VITAMIN B12) 1000 MCG/ML injection INJECT 1 ML (1,000 MCG TOTAL) INTO THE MUSCLE EVERY 30 (THIRTY) DAYS. FOR B12 VITAMIN 3 mL 0   desipramine (NORPRAMIN) 25 MG tablet 2  qam 60 tablet 6   diphenhydrAMINE (BENADRYL) 2 % cream Apply topically 3 (three) times daily as needed for itching.     doxycycline (MONODOX) 100 MG capsule Take 1 capsule (100 mg total) by mouth 2 (two) times daily. Take with food and drink 60 capsule 0   Dulaglutide (TRULICITY) 3  MG/0.5ML SOPN Inject 3 mg into the skin once a week.     DULoxetine HCl 40 MG CPEP Take 0.5 capsules (20 mg total) by mouth 2 (two) times daily. 2 q day 60 capsule 4   estradiol (ESTRACE) 0.1 MG/GM vaginal cream Estrogen Cream Instruction Discard applicator Apply pea sized amount to tip of finger to urethra before bed. Wash hands well after application. Use Monday, Wednesday and Friday 42.5 g 11   Evolocumab (REPATHA SURECLICK) 140 MG/ML SOAJ Inject 140 mg into the skin every 14 (fourteen) days. 2 mL 5   fenofibrate (TRICOR) 145 MG tablet Take 1 tablet (145 mg total) by mouth daily. For cholesterol. Please call [phone] for an appointment. 90 tablet 3   glipiZIDE (GLUCOTROL XL) 10 MG 24 hr tablet Take 1 tablet (10 mg total) by mouth daily with breakfast. 90 tablet 3   hydrOXYzine (VISTARIL) 50 MG capsule Take 2 capsules (100 mg total) by mouth at bedtime. TAKE 1 CAPSLE BY MOUTH AT NOON, 1 AT 6 PM, AND 1 AS NEEDED 90 capsule 3   ibuprofen (ADVIL) 200 MG tablet Take 400 mg by mouth 2 (two) times daily.     ketoconazole (NIZORAL) 2 % shampoo Apply 1 Application topically as directed. Wash from waist up 3 times a week for 2 months, then decrease to 1 time a month, let sit 5 minutes and rinse off 120 mL 6   lamoTRIgine (LAMICTAL) 150 MG tablet Take 2 tablets (300 mg total) by mouth daily at bedtime. 60 tablet 6   levalbuterol (XOPENEX HFA) 45 MCG/ACT inhaler Inhale 2 puffs into the lungs every 4  (four) hours as needed for wheezing.     Lysine 500 MG TABS Take 1,000 mg by mouth daily.     methocarbamol (ROBAXIN) 500 MG tablet TAKE 1-2 TABLETS (500-1,000 MG TOTAL) BY MOUTH AT BEDTIME AS NEEDED FOR MUSCLE SPASMS. 180 tablet 0   metoprolol tartrate (LOPRESSOR) 50 MG tablet Take 1 tablet (50 mg total) by mouth 2 (two) times daily. 180 tablet 3   montelukast (SINGULAIR) 10 MG tablet TAKE 1 TABLET BY MOUTH EVERYDAY AT BEDTIME 90 tablet 1   mupirocin ointment (BACTROBAN) 2 % Apply 1 Application topically daily. Qd to wound on finger 22 g 1   neomycin-bacitracin-polymyxin (NEOSPORIN) [phone] ointment Apply 1 application topically 4 (four) times daily as needed (for cut/scrapes.).     pantoprazole (PROTONIX) 40 MG tablet TAKE 1 TABLET (40 MG TOTAL) BY MOUTH 2 (TWO) TIMES DAILY BEFORE A MEAL. FOR HEARTBURN 180 tablet 3   polyvinyl alcohol (LIQUIFILM TEARS) 1.4 % ophthalmic solution Place 2 drops into both eyes at bedtime.     Ruxolitinib Phosphate (OPZELURA) 1.5 % CREA Apply to aa's rash QD PRN. 60 g 3   STIOLTO RESPIMAT 2.5-2.5 MCG/ACT AERS Inhale 2 puffs into the lungs at bedtime.     temazepam (RESTORIL) 30 MG capsule TAKE ONE TO TWO CAPSULES BY MOUTH NIGHTLY AT BEDTIME 60 capsule 5   traZODone (DESYREL) 50 MG tablet Take 2 tablets (100 mg total) by mouth at bedtime. 60 tablet 7   valACYclovir (VALTREX) 1000 MG tablet Take 2 tablets by mouth twice daily for 1 day as needed for herpes outbreak. 12 tablet 0   Vitamin D3 (VITAMIN D) 25 MCG tablet Take 3,000 Units by mouth daily.     No facility-administered medications prior to visit.    BP 120/70 (BP Location: Left Arm, Cuff Size: Normal)   Pulse (!) 104   Temp 97.6  F (36.4 C) (Temporal)   Ht 5\' 8"  (1.727 m)   Wt 247 lb (112 kg)   LMP 08/23/2014   SpO2 95%   BMI 37.56 kg/m      Imaging: MM 3D DIAGNOSTIC MAMMOGRAM BILATERAL BREAST  Result Date: 02/25/2023 CLINICAL DATA:  Patient reports 2 time episode of noting redness and pain  in the inferior part of her LEFT breast. She would then squeeze her nipple and predominantly white thick material would be expressed from this area with symptomatic relief. History of remote benign LEFT breast biopsy. EXAM: DIGITAL DIAGNOSTIC BILATERAL MAMMOGRAM WITH TOMOSYNTHESIS; ULTRASOUND LEFT BREAST LIMITED TECHNIQUE: Bilateral digital diagnostic mammography and breast tomosynthesis was performed.; Targeted ultrasound examination of the left breast was performed. COMPARISON:  Previous exam(s). ACR Breast Density Category b: There are scattered areas of fibroglandular density. FINDINGS: Spot compression tomosynthesis views were obtained of the site of palpable concern in the LEFT retroareolar breast. No suspicious mammographic findings are noted in this area. Stable mammographic appearance of a previously biopsied benign LEFT breast mass. No suspicious mass, distortion, or microcalcifications are identified to suggest presence of malignancy bilateral. On physical exam, no suspicious mass is appreciated. Targeted LEFT retroareolar ultrasound was performed. No suspicious cystic or solid mass is seen. No suspicious intraductal mass is noted. IMPRESSION: 1. No mammographic or sonographic evidence of malignancy in the LEFT retroareolar breast. Non spontaneous nipple discharge is likely physiologic/due to infection/inflammation. Any further workup of the patient's symptoms should be based on the clinical assessment. Recommend routine annual screening mammogram in 1 year. 2. No mammographic evidence of malignancy bilaterally. RECOMMENDATION: Screening mammogram in one year.(Code:SM-B-01Y) I have discussed the findings and recommendations with the patient. If applicable, a reminder letter will be sent to the patient regarding the next appointment. BI-RADS CATEGORY  2: Benign. Electronically Signed   By: Meda Klinefelter M.D.   On: 02/25/2023 15:50   [place] LIMITED ULTRASOUND INCLUDING AXILLA LEFT BREAST   Result Date:  02/25/2023 CLINICAL DATA:  Patient reports 2 time episode of noting redness and pain in the inferior part of her LEFT breast. She would then squeeze her nipple and predominantly white thick material would be expressed from this area with symptomatic relief. History of remote benign LEFT breast biopsy. EXAM: DIGITAL DIAGNOSTIC BILATERAL MAMMOGRAM WITH TOMOSYNTHESIS; ULTRASOUND LEFT BREAST LIMITED TECHNIQUE: Bilateral digital diagnostic mammography and breast tomosynthesis was performed.; Targeted ultrasound examination of the left breast was performed. COMPARISON:  Previous exam(s). ACR Breast Density Category b: There are scattered areas of fibroglandular density. FINDINGS: Spot compression tomosynthesis views were obtained of the site of palpable concern in the LEFT retroareolar breast. No suspicious mammographic findings are noted in this area. Stable mammographic appearance of a previously biopsied benign LEFT breast mass. No suspicious mass, distortion, or microcalcifications are identified to suggest presence of malignancy bilateral. On physical exam, no suspicious mass is appreciated. Targeted LEFT retroareolar ultrasound was performed. No suspicious cystic or solid mass is seen. No suspicious intraductal mass is noted. IMPRESSION: 1. No mammographic or sonographic evidence of malignancy in the LEFT retroareolar breast. Non spontaneous nipple discharge is likely physiologic/due to infection/inflammation. Any further workup of the patient's symptoms should be based on the clinical assessment. Recommend routine annual screening mammogram in 1 year. 2. No mammographic evidence of malignancy bilaterally. RECOMMENDATION: Screening mammogram in one year.(Code:SM-B-01Y) I have discussed the findings and recommendations with the patient. If applicable, a reminder letter will be sent to the patient regarding the next appointment. BI-RADS CATEGORY  2:  Benign. Electronically Signed   By: Meda Klinefelter M.D.   On:  02/25/2023 15:50          No data to display          No results found for: "NITRICOXIDE"  BP 120/70 (BP Location: Left Arm, Cuff Size: Normal)   Pulse (!) 104   Temp 97.6 F (36.4 C) (Temporal)   Ht 5\' 8"  (1.727 m)   Wt 247 lb (112 kg)   LMP 08/23/2014   SpO2 95%   BMI 37.56 kg/m       Review of Systems: Gen:  Denies  fever, sweats, chills weight loss  HEENT: Denies blurred vision, double vision, ear pain, eye pain, hearing loss, nose bleeds, sore throat Cardiac:  No dizziness, chest pain or heaviness, chest tightness,edema, No JVD Resp:   No cough, -sputum production, -shortness of breath,-wheezing, -hemoptysis,  Other:  All other systems negative   Physical Examination:   General Appearance: No distress  EYES PERRLA, EOM intact.   NECK Supple, No JVD Pulmonary: normal breath sounds, No wheezing.  CardiovascularNormal S1,S2.  No m/r/g.   Abdomen: Benign, Soft, non-tender. Neurology UE/LE 5/5 strength, no focal deficits Ext pulses intact, cap refill intact ALL OTHER ROS ARE NEGATIVE     ASSESSMENT AND PLAN OSA (obstructive sleep apnea) Excellent control and compliance on CPAP  She benefits and uses CPAP 100% compliant  COPD stable Continue Xopenex inhaler as needed Continue Stiolto  Obesity -recommend significant weight loss -recommend changing diet  Deconditioned state -Recommend increased daily activity and exercise   Preop pulmonary/respiratory exam Pulmonary preop risk assessment.  Patient is a mild to moderate risk.  We went over the potential pulmonary surgical risk.   Major Pulmonary risks identified in the multifactorial risk analysis are but not limited to a) pneumonia; b) recurrent intubation risk; c) prolonged or recurrent acute respiratory failure needing mechanical ventilation; d) prolonged hospitalization; e) DVT/Pulmonary embolism; f) Acute Pulmonary edema  Recommend 1. Short duration of surgery as much as possible and avoid  paralytic if possible 2. Recovery in step down or ICU with Pulmonary consultation if indicated.  3. DVT prophylaxis 4. Aggressive pulmonary toilet with o2, bronchodilatation, and incentive spirometry and early ambulation      MEDICATION ADJUSTMENTS/LABS AND TESTS ORDERED:  Continue therapy for sleep apnea Use inhalers as prescribed  Avoid secondhand smoke Avoid SICK contacts Recommend  Masking  when appropriate Recommend Keep up-to-date with vaccinations   CURRENT MEDICATIONS REVIEWED AT LENGTH WITH PATIENT TODAY   Patient  satisfied with Plan of action and management. All questions answered  Follow up 1 year   Total Time Spent  32 mins   Emily Moon Santiago Glad, M.D.  [facility]  Medical Director [facility] Medical Director [facility] Cardio[facility]Pulmonary Department

## 2023-03-24 NOTE — Progress Notes (Unsigned)
03/25/2023 4:40 PM   Emily Moon September 16, 1961 [id number]  Referring provider: Doreene Nest, NP [address] [place],  [place] [address]  Urological history: 1.  Nephrolithiasis -Bilateral URS (09/2016)  2. OAB -Contributing factors of multiple sclerosis, hypertension, sleep apnea (OSA), COPD, diabetes, obesity, depression, anxiety, memory issues, antihistamines and vaginal atrophy  3. High risk hematuria -former smoker -contrast CT (03/2022) pyelonephritis -cysto (07/2022) NED   No chief complaint on file.   HPI: Emily Moon is a 62 y.o. female who presents today for recheck on UTI.    On 01/06/2023 office visit, on Saturday, she couldn't pass urine.  She took 4 trimethoprim because she didn't know what else to do.  Patient denies any modifying or aggravating factors.  Patient denies any gross hematuria, dysuria or suprapubic/flank pain.  Patient denies any fevers, chills, nausea or vomiting.  UA yellow slightly cloudy, specific gravity 1.015, trace blood, pH 6.0, 3+ leukocyte, greater than 30 WBCs, 3-10 RBCs, 0-2 epithelial cells and moderate bacteria. PVR 0 mL.  Urine culture positive for E. coli and beta-hemolytic Streptococcus, group B.  She was treated with culture appropriate antibiotics.   PMH: Past Medical History:  Diagnosis Date   Acute pyelonephritis 12/25/2021   Arthritis    osteo   Asthma    Bipolar disorder (HCC) 05/21/2014   Cataracts, bilateral    Cervical radiculitis    Chest pain    Chronic pain syndrome    COPD (chronic obstructive pulmonary disease) (HCC)    DDD (degenerative disc disease), cervical    also back   Depression    Diabetes mellitus without complication (HCC)    Dizziness    Positional   Edema    feet/legs   Epigastric pain    Fibromyalgia syndrome    Fungal infection    Finger nails   GERD (gastroesophageal reflux disease)    Gout    Heart palpitations    Hip dysplasia, 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   History  of kidney stones    Hx of sepsis    Hypercholesterolemia    IDA (iron deficiency anemia)    Multiple sclerosis (HCC)    weakness   Osteoporosis    osteoarthritis   Pneumonia    PONV (postoperative nausea and vomiting)    no problem after cataract surgery   Prediabetes 07/22/2018   Psoriasis    Sciatica    Seasonal allergies    Shortness of breath dyspnea    wheezing   Sleep apnea 2012   sleep study / slight, no interventions   Urinary frequency    Vitamin B 12 deficiency    Weight gain 06/21/2014    Surgical History: Past Surgical History:  Procedure Laterality Date   CATARACT EXTRACTION W/PHACO Left 05/21/2015   Procedure: CATARACT EXTRACTION PHACO AND INTRAOCULAR LENS PLACEMENT (IOC);  Surgeon: Galen Manila, MD;  Location: [facility];  Service: Ophthalmology;  Laterality: Left;  [place] 00:35 AP% 22.9 CDE 8.11 fluid pack lot #[id number]   CATARACT EXTRACTION W/PHACO Right 06/04/2015   Procedure: CATARACT EXTRACTION PHACO AND INTRAOCULAR LENS PLACEMENT (IOC);  Surgeon: Galen Manila, MD;  Location: [facility];  Service: Ophthalmology;  Laterality: Right;  US:00:48 AP%: 10.5 CDE:5.08 Fluid lot #[id number]   CYSTOSCOPY/URETEROSCOPY/HOLMIUM LASER/STENT PLACEMENT Bilateral 09/22/2016   Procedure: CYSTOSCOPY/URETEROSCOPY/HOLMIUM LASER/STENT PLACEMENT;  Surgeon: Vanna Scotland, MD;  Location: [facility];  Service: Urology;  Laterality: Bilateral;   EYE SURGERY  2015   tissue biopsy   FOOT SURGERY  2015  JOINT REPLACEMENT Left 2013   hip replacement   LITHOTRIPSY     PTOSIS REPAIR Bilateral 02/18/2016   Procedure: BILATERAL PTOSIS REPAIR UPPER EYELIDS;  Surgeon: Imagene Riches, MD;  Location: [facility];  Service: Ophthalmology;  Laterality: Bilateral;  LEAVE PT EARLY AM   thumb surgery Right    TONSILLECTOMY  1973   TOTAL HIP ARTHROPLASTY Right 07/02/2021   Procedure: TOTAL HIP ARTHROPLASTY ANTERIOR APPROACH;  Surgeon: Ollen Gross, MD;  Location: [facility];  Service:  Orthopedics;  Laterality: Right;    Home Medications:  Allergies as of 03/25/2023       Reactions   Albuterol Shortness Of Breath, Other (See Comments)   Makes pt feel jittery/ tacycardic   Crestor [rosuvastatin] Other (See Comments)   Joint pain, muscle pain, and hair loss   Halcion [triazolam] Other (See Comments)   Dizziness,headaches,bladder problems   Levaquin [levofloxacin In D5w] Diarrhea, Itching   Shoulder pain   Metformin And Related Rash   Naproxen Sodium    Patient tolerates in small doses. Her reaction is swelling of the lower extremities.    Ozempic (0.25 Or 0.5 Mg-dose) [semaglutide(0.25 Or 0.5mg -dos)] Itching   Cefaclor Other (See Comments)   Doesn't remember---unsure if actually allergic    Sulfa Antibiotics Itching   Unsure of reaction possibly itching   Tramadol Itching, Nausea And Vomiting   Zinc Other (See Comments)   constipation  constipation    Aripiprazole Other (See Comments)   Muscle tension/cramping   Diclofenac Sodium Rash   "made very sick"        Medication List        Accurate as of Mar 24, 2023  4:40 PM. If you have any questions, ask your nurse or doctor.          aspirin EC 81 MG tablet Take 81 mg by mouth at bedtime. Swallow whole.   Azelastine HCl 137 MCG/SPRAY Soln PLACE 2 SPRAYS INTO BOTH NOSTRILS 2 (TWO) TIMES DAILY   BIG 100 (BIOTIN) PO Take 300 mg by mouth at bedtime.   bisacodyl 5 MG EC tablet Commonly known as: DULCOLAX Take 5 mg by mouth daily as needed for moderate constipation.   cetirizine 10 MG tablet Commonly known as: ZYRTEC Take 10 mg by mouth 2 (two) times daily.   clindamycin 1 % external solution Commonly known as: CLEOCIN T Apply to scalp once or twice a day prn bumps   cyanocobalamin 1000 MCG/ML injection Commonly known as: VITAMIN B12 INJECT 1 ML (1,000 MCG TOTAL) INTO THE MUSCLE EVERY 30 (THIRTY) DAYS. FOR B12 VITAMIN   desipramine 25 MG tablet Commonly known as: NORPRAMIN 2  qam    diphenhydrAMINE 2 % cream Commonly known as: BENADRYL Apply topically 3 (three) times daily as needed for itching.   doxycycline 100 MG capsule Commonly known as: MONODOX Take 1 capsule (100 mg total) by mouth 2 (two) times daily. Take with food and drink   DULoxetine HCl 40 MG Cpep Take 0.5 capsules (20 mg total) by mouth 2 (two) times daily. 2 q day   estradiol 0.1 MG/GM vaginal cream Commonly known as: ESTRACE Estrogen Cream Instruction Discard applicator Apply pea sized amount to tip of finger to urethra before bed. Wash hands well after application. Use Monday, Wednesday and Friday   fenofibrate 145 MG tablet Commonly known as: TRICOR Take 1 tablet (145 mg total) by mouth daily. For cholesterol. Please call [phone] for an appointment.   FreeStyle [employer] 3 Sensor Misc PLACE  1 SENSOR ON THE SKIN EVERY 14 DAYS. USE TO CHECK GLUCOSE CONTINUOUSLY   glipiZIDE 10 MG 24 hr tablet Commonly known as: GLUCOTROL XL Take 1 tablet (10 mg total) by mouth daily with breakfast.   hydrOXYzine 50 MG capsule Commonly known as: VISTARIL Take 2 capsules (100 mg total) by mouth at bedtime. TAKE 1 CAPSLE BY MOUTH AT NOON, 1 AT 6 PM, AND 1 AS NEEDED   ibuprofen 200 MG tablet Commonly known as: ADVIL Take 400 mg by mouth 2 (two) times daily.   ketoconazole 2 % shampoo Commonly known as: NIZORAL Apply 1 Application topically as directed. Wash from waist up 3 times a week for 2 months, then decrease to 1 time a month, let sit 5 minutes and rinse off   lamoTRIgine 150 MG tablet Commonly known as: LAMICTAL Take 2 tablets (300 mg total) by mouth daily at bedtime.   levalbuterol 45 MCG/ACT inhaler Commonly known as: XOPENEX HFA Inhale 2 puffs into the lungs every 4 (four) hours as needed for wheezing.   Lysine 500 MG Tabs Take 1,000 mg by mouth daily.   methocarbamol 500 MG tablet Commonly known as: ROBAXIN TAKE 1-2 TABLETS (500-1,000 MG TOTAL) BY MOUTH AT BEDTIME AS NEEDED FOR MUSCLE  SPASMS.   metoprolol tartrate 50 MG tablet Commonly known as: LOPRESSOR Take 1 tablet (50 mg total) by mouth 2 (two) times daily.   montelukast 10 MG tablet Commonly known as: SINGULAIR TAKE 1 TABLET BY MOUTH EVERYDAY AT BEDTIME   mupirocin ointment 2 % Commonly known as: BACTROBAN Apply 1 Application topically daily. Qd to wound on finger   neomycin-bacitracin-polymyxin [phone] ointment Apply 1 application topically 4 (four) times daily as needed (for cut/scrapes.).   Opzelura 1.5 % Crea Generic drug: Ruxolitinib Phosphate Apply to aa's rash QD PRN.   pantoprazole 40 MG tablet Commonly known as: PROTONIX TAKE 1 TABLET (40 MG TOTAL) BY MOUTH 2 (TWO) TIMES DAILY BEFORE A MEAL. FOR HEARTBURN   polyvinyl alcohol 1.4 % ophthalmic solution Commonly known as: LIQUIFILM TEARS Place 2 drops into both eyes at bedtime.   Repatha SureClick 140 MG/ML Soaj Generic drug: Evolocumab Inject 140 mg into the skin every 14 (fourteen) days.   Stiolto Respimat 2.5-2.5 MCG/ACT Aers Generic drug: Tiotropium Bromide-Olodaterol Inhale 2 puffs into the lungs at bedtime.   temazepam 30 MG capsule Commonly known as: RESTORIL TAKE ONE TO TWO CAPSULES BY MOUTH NIGHTLY AT BEDTIME   traZODone 50 MG tablet Commonly known as: DESYREL Take 2 tablets (100 mg total) by mouth at bedtime.   Trulicity 3 MG/0.5ML Sopn Generic drug: Dulaglutide Inject 3 mg into the skin once a week.   valACYclovir 1000 MG tablet Commonly known as: VALTREX Take 2 tablets by mouth twice daily for 1 day as needed for herpes outbreak.   vitamin D3 25 MCG tablet Commonly known as: CHOLECALCIFEROL Take 3,000 Units by mouth daily.        Allergies:  Allergies  Allergen Reactions   Albuterol Shortness Of Breath and Other (See Comments)    Makes pt feel jittery/ tacycardic   Crestor [Rosuvastatin] Other (See Comments)    Joint pain, muscle pain, and hair loss   Halcion [Triazolam] Other (See Comments)     Dizziness,headaches,bladder problems   Levaquin [Levofloxacin In D5w] Diarrhea and Itching    Shoulder pain   Metformin And Related Rash   Naproxen Sodium     Patient tolerates in small doses. Her reaction is swelling of the lower extremities.  Ozempic (0.25 Or 0.5 Mg-Dose) [Semaglutide(0.25 Or 0.5mg -Dos)] Itching   Cefaclor Other (See Comments)    Doesn't remember---unsure if actually allergic    Sulfa Antibiotics Itching    Unsure of reaction possibly itching   Tramadol Itching and Nausea And Vomiting   Zinc Other (See Comments)    constipation  constipation    Aripiprazole Other (See Comments)    Muscle tension/cramping   Diclofenac Sodium Rash    "made very sick"    Family History: Family History  Problem Relation Age of Onset   Cancer Mother    Heart disease Mother    Diabetes Mother    Sleep apnea Mother    Cancer Father        Abdomen with mastasis   High Cholesterol Father    Kidney disease Neg Hx    Bladder Cancer Neg Hx    Prostate cancer Neg Hx    Kidney cancer Neg Hx     Social History:  reports that she quit smoking about 5 years ago. Her smoking use included cigarettes. She has a 25.00 pack-year smoking history. She has been exposed to tobacco smoke. She has never used smokeless tobacco. She reports current drug use. Drugs: Methylphenidate and Marijuana. She reports that she does not drink alcohol.  ROS: Pertinent ROS in HPI  Physical Exam: LMP 08/23/2014   Constitutional:  Well nourished. Alert and oriented, No acute distress. HEENT: [place] AT, moist mucus membranes.  Trachea midline, no masses. Cardiovascular: No clubbing, cyanosis, or edema. Respiratory: Normal respiratory effort, no increased work of breathing. GU: No CVA tenderness.  No bladder fullness or masses. Vulvovaginal atrophy w/ pallor, loss of rugae, introital retraction, excoriations.  Vulvar thinning, fusion of labia, clitoral hood retraction, prominent urethral meatus.   *** external  genitalia, *** pubic hair distribution, no lesions.  Normal urethral meatus, no lesions, no prolapse, no discharge.   No urethral masses, tenderness and/or tenderness. No bladder fullness, tenderness or masses. *** vagina mucosa, *** estrogen effect, no discharge, no lesions, *** pelvic support, *** cystocele and *** rectocele noted.  No cervical motion tenderness.  Uterus is freely mobile and non-fixed.  No adnexal/parametria masses or tenderness noted.  Anus and perineum are without rashes or lesions.   ***  Neurologic: Grossly intact, no focal deficits, moving all 4 extremities. Psychiatric: Normal mood and affect.    Laboratory Data: Lab Results  Component Value Date   CREATININE 0.98 03/23/2023   Lab Results  Component Value Date   HGBA1C 6.6 (H) 03/23/2023      Component Value Date/Time   CHOL 122 03/23/2023 1049   HDL 39 (L) 03/23/2023 1049   CHOLHDL 4 08/20/2022 1508   VLDL 39.2 08/20/2022 1508   LDLCALC 56 03/23/2023 1049   Lab Results  Component Value Date   AST 10 03/23/2023   Lab Results  Component Value Date   ALT 11 03/23/2023    Urinalysis See EPIC and HPI I have reviewed the labs.   Pertinent Imaging: N/A  Assessment & Plan:    1. Feelings of incomplete bladder emptying -UA grossly infected -urine culture -PVR 0 mL  2. Suspected UTI -UA is grossly infected and sent for culture -Her feelings of incomplete bladder emptying are likely due to UTI -Macrobid 100 mg twice daily sent to pharmacy, we will adjust once urine culture results are available -UA also had microscopic hematuria, so we need to recheck after UTI is treated to ensure micro heme resolves  No follow-ups on  file.  These notes generated with voice recognition software. I apologize for typographical errors.  Cloretta Ned  [facility] [address]  [address] [place], [place] [address] [phone]

## 2023-03-25 ENCOUNTER — Encounter: Payer: Self-pay | Admitting: Urology

## 2023-03-25 ENCOUNTER — Ambulatory Visit: Payer: PPO | Admitting: Urology

## 2023-03-25 VITALS — BP 118/77 | HR 97 | Ht 68.5 in | Wt 245.0 lb

## 2023-03-25 DIAGNOSIS — R310 Gross hematuria: Secondary | ICD-10-CM | POA: Diagnosis not present

## 2023-03-25 DIAGNOSIS — N390 Urinary tract infection, site not specified: Secondary | ICD-10-CM

## 2023-03-26 ENCOUNTER — Other Ambulatory Visit: Payer: Self-pay | Admitting: Primary Care

## 2023-03-26 DIAGNOSIS — J3089 Other allergic rhinitis: Secondary | ICD-10-CM

## 2023-03-26 LAB — MICROSCOPIC EXAMINATION

## 2023-03-26 LAB — URINALYSIS, COMPLETE
Bilirubin, UA: NEGATIVE
Glucose, UA: NEGATIVE
Ketones, UA: NEGATIVE
Nitrite, UA: NEGATIVE
Protein,UA: NEGATIVE
Specific Gravity, UA: >1.03 — ABNORMAL HIGH (ref 1.005–1.030)
Urobilinogen, Ur: 0.2 mg/dL (ref 0.2–1.0)
pH, UA: 5.5 (ref 5.0–7.5)

## 2023-03-27 LAB — CULTURE, URINE COMPREHENSIVE

## 2023-03-29 ENCOUNTER — Other Ambulatory Visit: Payer: Self-pay | Admitting: Urology

## 2023-03-29 LAB — CULTURE, URINE COMPREHENSIVE

## 2023-03-29 MED ORDER — CEFUROXIME AXETIL 500 MG PO TABS: 500.0000 mg | ORAL_TABLET | Freq: Two times a day (BID) | ORAL | 0 refills | Status: DC

## 2023-03-31 ENCOUNTER — Ambulatory Visit
Admission: RE | Admit: 2023-03-31 | Discharge: 2023-03-31 | Disposition: A | Discharge status: Home / Self Care | Payer: PPO | Source: Ambulatory Visit | Attending: Urology | Admitting: Urology

## 2023-03-31 DIAGNOSIS — N390 Urinary tract infection, site not specified: Secondary | ICD-10-CM | POA: Insufficient documentation

## 2023-03-31 DIAGNOSIS — R310 Gross hematuria: Secondary | ICD-10-CM | POA: Insufficient documentation

## 2023-04-04 ENCOUNTER — Other Ambulatory Visit: Payer: Self-pay | Admitting: Dermatology

## 2023-04-05 ENCOUNTER — Encounter: Payer: Self-pay | Admitting: Podiatry

## 2023-04-05 ENCOUNTER — Ambulatory Visit: Payer: PPO | Admitting: Podiatry

## 2023-04-05 DIAGNOSIS — E119 Type 2 diabetes mellitus without complications: Secondary | ICD-10-CM

## 2023-04-05 DIAGNOSIS — L600 Ingrowing nail: Secondary | ICD-10-CM | POA: Diagnosis not present

## 2023-04-05 MED ORDER — NEOMYCIN-POLYMYXIN-HC 1 % OT SOLN: OTIC | 1 refills | Status: AC

## 2023-04-05 NOTE — Patient Instructions (Signed)

## 2023-04-06 ENCOUNTER — Telehealth (INDEPENDENT_AMBULATORY_CARE_PROVIDER_SITE_OTHER): Payer: PPO | Admitting: Family Medicine

## 2023-04-06 ENCOUNTER — Encounter (INDEPENDENT_AMBULATORY_CARE_PROVIDER_SITE_OTHER): Payer: Self-pay | Admitting: Family Medicine

## 2023-04-06 VITALS — Ht 68.0 in | Wt 243.0 lb

## 2023-04-06 DIAGNOSIS — Z6836 Body mass index (BMI) 36.0-36.9, adult: Secondary | ICD-10-CM

## 2023-04-06 DIAGNOSIS — R7989 Other specified abnormal findings of blood chemistry: Secondary | ICD-10-CM | POA: Diagnosis not present

## 2023-04-06 DIAGNOSIS — E1159 Type 2 diabetes mellitus with other circulatory complications: Secondary | ICD-10-CM | POA: Diagnosis not present

## 2023-04-06 DIAGNOSIS — Z7985 Long-term (current) use of injectable non-insulin antidiabetic drugs: Secondary | ICD-10-CM

## 2023-04-06 DIAGNOSIS — E11649 Type 2 diabetes mellitus with hypoglycemia without coma: Secondary | ICD-10-CM | POA: Diagnosis not present

## 2023-04-06 DIAGNOSIS — E8881 Metabolic syndrome: Secondary | ICD-10-CM | POA: Diagnosis not present

## 2023-04-06 DIAGNOSIS — Z7984 Long term (current) use of oral hypoglycemic drugs: Secondary | ICD-10-CM | POA: Diagnosis not present

## 2023-04-06 NOTE — Assessment & Plan Note (Signed)
Reviewed labs from last visit TSH was mildly elevated at 4.8 She denies ever needing thyroid replacement She c/o fatigue  Recheck thyroid panel in 3 mos

## 2023-04-06 NOTE — Progress Notes (Addendum)
Office: [phone]  /  Fax: [phone]  WEIGHT SUMMARY AND BIOMETRICS  No data recorded No data recorded  No data recorded  No data recorded No data recorded  This visit was done as a face to face video visit. Patient was at home in [place], [place] Physician was in office at [facility]. Patient is aware of the limitations of a video visit.  HPI  Chief Complaint: OBESITY  Emily Moon is here to discuss her progress with her obesity treatment plan. She is on the the Category 2 Plan and states she is following her eating plan approximately 60 % of the time. She states she is exercising 10 minutes 2 times per week.   Interval History:  Since last office visit she is down 4 lb in 2 weeks based on her home scale She has been eating off plan: a Premier Protein shake for breakfast (late AM), a large meal from [employer]- meat, veggies (starchy and non starchy) and a heavy late night snack like Ratio yogurt, triscuits and PB.   She is seen via VV today due to vertigo She is dealing with vertigo and a UTI She is logging her calories getting 1300-1500 per day most days She reports low blood sugars into the 50s that are sporadic She reports eating a large serving of mac and cheese for her low sugars that this didn't help She hasn't been very active due to a procedure on her foot and vertigo She is keeping junk food out of the house  Pharmacotherapy: on Trulcity 3 mg weekly for T2DM by PCP  PHYSICAL EXAM:  Height 5\' 8"  (1.727 m), weight 243 lb (110.2 kg), last menstrual period 08/23/2014. Body mass index is 36.95 kg/m.  General: She is overweight, cooperative, alert, well developed, and in no acute distress. PSYCH: Has normal mood, affect and thought process.   Laying in bed on camera   ASSESSMENT AND PLAN  TREATMENT PLAN FOR OBESITY:  Recommended Dietary Goals  Emily Moon is currently in the action stage of change. As such, her goal is to continue  weight management plan. She has agreed to the Category 2 Plan. She is encouraged to continue dietary logging  With her REE 2146, she can flex her calories to 1500 per day if needed  Behavioral Intervention  We discussed the following Behavioral Modification Strategies today: increasing lean protein intake, decreasing simple carbohydrates , increasing vegetables, increasing lower glycemic fruits, increasing water intake, work on meal planning and preparation, work on tracking and journaling calories using tracking application, keeping healthy foods at home, continue to practice mindfulness when eating, and planning for success. - she is still eating out daily  Additional resources provided today: NA  Recommended Physical Activity Goals  Emily Moon has been advised to work up to 150 minutes of moderate intensity aerobic activity a week and strengthening exercises 2-3 times per week for cardiovascular health, weight loss maintenance and preservation of muscle mass.   She has agreed to Think about ways to increase daily physical activity and overcoming barriers to exercise  Pharmacotherapy changes for the treatment of obesity: none  ASSOCIATED CONDITIONS ADDRESSED TODAY  Type 2 diabetes mellitus with other circulatory complication, without long-term current use of insulin (HCC) Assessment & Plan: Lab Results  Component Value Date   HGBA1C 6.6 (H) 03/23/2023   Reviewed labs from last visit She has improved her diabetes control with an A1c reduction from 7 to 6.6 She reports stopping glipizide and starting metformin 500 mg  once daily per her neurologist She remains on Trulicity 3 mg weekly She has limited improvement in satiety on Trulicity but previously felt suicidal from Ozempic She is more mindful of her food choices and hopes to improve physical activity as she feels better  Continue to monitor home glucose readings, AM fasting and 2 hr PP daily Continue current meds F/u with Neuro  due to blood sugar changes with the addition of metformin   Morbid obesity (HCC) with starting BMI 37  BMI 36.0-36.9,adult  Elevated TSH Assessment & Plan: Reviewed labs from last visit TSH was mildly elevated at 4.8 She denies ever needing thyroid replacement She c/o fatigue  Recheck thyroid panel in 3 mos   Metabolic syndrome Assessment & Plan: Reviewed lab from last visit TG elevated at 155, HDL low at 39, abd circumf >35" c/w metabolic syndrome  We discussed treatment for metabolic syndrome is low sugar/ lower starch diet, regular exercise, weight reduction    Hypoglycemia associated with type 2 diabetes mellitus (HCC) Assessment & Plan: She self reports glucose readings into the low 50s that are sporadic and not associated with meal skipping, high Glycemic index foods.  She is off glipizide and on metformin which should have improved her hypoglycemia.  Trulicity dose was not recently increased  Reviewed her daily food intake Recommend one carb serving with her morning protein shake Keep PB crackers on hand for readings < 70 Eat on a schedule  Avoid high GI foods        She was informed of the importance of frequent follow up visits to maximize her success with intensive lifestyle modifications for her multiple health conditions.   ATTESTASTION STATEMENTS:  Reviewed by clinician on day of visit: allergies, medications, problem list, medical history, surgical history, family history, social history, and previous encounter notes pertinent to obesity diagnosis.   I have personally spent 30 minutes total time today in preparation, patient care, nutritional counseling and documentation for this visit, including the following: review of clinical lab tests; review of medical tests/procedures/services.      Glennis Brink, DO DABFM, DABOM [facility] [address] [place], [place] [address] [phone]

## 2023-04-06 NOTE — Assessment & Plan Note (Signed)
She self reports glucose readings into the low 50s that are sporadic and not associated with meal skipping, high Glycemic index foods.  She is off glipizide and on metformin which should have improved her hypoglycemia.  Trulicity dose was not recently increased  Reviewed her daily food intake Recommend one carb serving with her morning protein shake Keep PB crackers on hand for readings < 70 Eat on a schedule  Avoid high GI foods

## 2023-04-06 NOTE — Assessment & Plan Note (Signed)
Reviewed lab from last visit TG elevated at 155, HDL low at 39, abd circumf >35" c/w metabolic syndrome  We discussed treatment for metabolic syndrome is low sugar/ lower starch diet, regular exercise, weight reduction

## 2023-04-06 NOTE — Progress Notes (Signed)
She presents today for a diabetic foot follow-up.  She is complaining painful ingrown nail to the fourth digit of the right foot.  Otherwise she states that she is doing pretty good recent A1c was at 6.0.  She denies fever chills nausea vomit muscle aches and pains.  Objective: Vital signs stable alert oriented x 3 pulses are palpable.  She has an ingrown toenail to the tibial border of the fourth digit of the right foot which demonstrates some mild erythema incurvation along the tibial and fibular border and mildly to moderate tenderness on palpation.  No purulence no malodor is noted no cellulitic process.  Assessment: Ingrown nail fourth digit right foot diabetes noncomplicated.  Plan: Discussed etiology pathology conservative surgical therapies at this point performed chemical matricectomy.  She tolerated procedure well after local anesthetic was administered she was given both oral and home-going instructions for the care and soaking of this fourth toe and I will follow-up with her in 2 weeks.  Questions or concerns she will notify us immediately.

## 2023-04-06 NOTE — Assessment & Plan Note (Signed)
Lab Results  Component Value Date   HGBA1C 6.6 (H) 03/23/2023   Reviewed labs from last visit She has improved her diabetes control with an A1c reduction from 7 to 6.6 She reports stopping glipizide and starting metformin 500 mg once daily per her neurologist She remains on Trulicity 3 mg weekly She has limited improvement in satiety on Trulicity but previously felt suicidal from Ozempic She is more mindful of her food choices and hopes to improve physical activity as she feels better  Continue to monitor home glucose readings, AM fasting and 2 hr PP daily Continue current meds F/u with Neuro due to blood sugar changes with the addition of metformin

## 2023-04-07 NOTE — Progress Notes (Deleted)
04/08/2023 10:24 PM   Emily Moon 09-May-1961 [id number]  Referring provider: Doreene Nest, NP [address] [place],  [place] [address]  Urological history: 1.  Nephrolithiasis -Bilateral URS (09/2016)  2. OAB -Contributing factors of multiple sclerosis, hypertension, sleep apnea (OSA), COPD, diabetes, obesity, depression, anxiety, memory issues, antihistamines and vaginal atrophy  3. High risk hematuria -former smoker -contrast CT (03/2022) pyelonephritis -cysto (07/2022) NED   No chief complaint on file.   HPI: Emily Moon is a 62 y.o. female who presents today for recheck on UTI.    On 01/06/2023 office visit, on Saturday, she couldn't pass urine.  She took 4 trimethoprim because she didn't know what else to do.  Patient denies any modifying or aggravating factors.  Patient denies any gross hematuria, dysuria or suprapubic/flank pain.  Patient denies any fevers, chills, nausea or vomiting.  UA yellow slightly cloudy, specific gravity 1.015, trace blood, pH 6.0, 3+ leukocyte, greater than 30 WBCs, 3-10 RBCs, 0-2 epithelial cells and moderate bacteria. PVR 0 mL.  Urine culture positive for E. coli and beta-hemolytic Streptococcus, group B.  She was treated with culture appropriate antibiotics.  On 03/25/2023, she states her symptoms abated when she took the antibiotic but then they returned on Sunday.  She is now experiencing frequency, gross hematuria and dysuria.  Patient denies any modifying or aggravating factors.  Patient denies any gross hematuria, dysuria or suprapubic/flank pain.  Patient denies any fevers, chills, nausea or vomiting.   She had doxycycline at home which she initiated prior to her appointment today.  Urinalysis yellow slightly cloudy, specific gravity greater than 1.030, 1+ blood, 5.5 pH, trace leukocyte, 11-30 WBCs, 3-10 RBCs, 0-10 epithelial cells, mucus threads are present and moderate bacteria.  Urine culture was positive for E.coli.   RUS was negative.    PMH: Past Medical History:  Diagnosis Date   Acute pyelonephritis 12/25/2021   Arthritis    osteo   Asthma    Bipolar disorder (HCC) 05/21/2014   Cataracts, bilateral    Cervical radiculitis    Chest pain    Chronic pain syndrome    COPD (chronic obstructive pulmonary disease) (HCC)    DDD (degenerative disc disease), cervical    also back   Depression    Diabetes mellitus without complication (HCC)    Dizziness    Positional   Edema    feet/legs   Epigastric pain    Fibromyalgia syndrome    Fungal infection    Finger nails   GERD (gastroesophageal reflux disease)    Gout    Heart palpitations    Hip dysplasia, 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   History of kidney stones    Hx of sepsis    Hypercholesterolemia    IDA (iron deficiency anemia)    Multiple sclerosis (HCC)    weakness   Osteoporosis    osteoarthritis   Pneumonia    PONV (postoperative nausea and vomiting)    no problem after cataract surgery   Prediabetes 07/22/2018   Psoriasis    Sciatica    Seasonal allergies    Shortness of breath dyspnea    wheezing   Sleep apnea 2012   sleep study / slight, no interventions   Urinary frequency    Vitamin B 12 deficiency    Weight gain 06/21/2014    Surgical History: Past Surgical History:  Procedure Laterality Date   CATARACT EXTRACTION W/PHACO Left 05/21/2015   Procedure: CATARACT EXTRACTION PHACO AND INTRAOCULAR LENS PLACEMENT (  IOC);  Surgeon: Galen Manila, MD;  Location: [facility];  Service: Ophthalmology;  Laterality: Left;  [place] 00:35 AP% 22.9 CDE 8.11 fluid pack lot #[id number]   CATARACT EXTRACTION W/PHACO Right 06/04/2015   Procedure: CATARACT EXTRACTION PHACO AND INTRAOCULAR LENS PLACEMENT (IOC);  Surgeon: Galen Manila, MD;  Location: [facility];  Service: Ophthalmology;  Laterality: Right;  US:00:48 AP%: 10.5 CDE:5.08 Fluid lot #[id number]   CYSTOSCOPY/URETEROSCOPY/HOLMIUM LASER/STENT PLACEMENT Bilateral 09/22/2016   Procedure:  CYSTOSCOPY/URETEROSCOPY/HOLMIUM LASER/STENT PLACEMENT;  Surgeon: Vanna Scotland, MD;  Location: [facility];  Service: Urology;  Laterality: Bilateral;   EYE SURGERY  2015   tissue biopsy   FOOT SURGERY  2015   JOINT REPLACEMENT Left 2013   hip replacement   LITHOTRIPSY     PTOSIS REPAIR Bilateral 02/18/2016   Procedure: BILATERAL PTOSIS REPAIR UPPER EYELIDS;  Surgeon: Imagene Riches, MD;  Location: [facility];  Service: Ophthalmology;  Laterality: Bilateral;  LEAVE PT EARLY AM   thumb surgery Right    TONSILLECTOMY  1973   TOTAL HIP ARTHROPLASTY Right 07/02/2021   Procedure: TOTAL HIP ARTHROPLASTY ANTERIOR APPROACH;  Surgeon: Ollen Gross, MD;  Location: [facility];  Service: Orthopedics;  Laterality: Right;    Home Medications:  Allergies as of 04/08/2023       Reactions   Albuterol Shortness Of Breath, Other (See Comments)   Makes pt feel jittery/ tacycardic   Crestor [rosuvastatin] Other (See Comments)   Joint pain, muscle pain, and hair loss   Halcion [triazolam] Other (See Comments)   Dizziness,headaches,bladder problems   Levaquin [levofloxacin In D5w] Diarrhea, Itching   Shoulder pain   Metformin And Related Rash   Naproxen Sodium    Patient tolerates in small doses. Her reaction is swelling of the lower extremities.    Ozempic (0.25 Or 0.5 Mg-dose) [semaglutide(0.25 Or 0.5mg -dos)] Itching   Cefaclor Other (See Comments)   Doesn't remember---unsure if actually allergic    Sulfa Antibiotics Itching   Unsure of reaction possibly itching   Tramadol Itching, Nausea And Vomiting   Zinc Other (See Comments)   constipation  constipation    Aripiprazole Other (See Comments)   Muscle tension/cramping   Diclofenac Sodium Rash   "made very sick"        Medication List        Accurate as of Apr 07, 2023 10:24 PM. If you have any questions, ask your nurse or doctor.          aspirin EC 81 MG tablet Take 81 mg by mouth at bedtime. Swallow whole.   Azelastine HCl  137 MCG/SPRAY Soln PLACE 2 SPRAYS INTO BOTH NOSTRILS 2 (TWO) TIMES DAILY   BIG 100 (BIOTIN) PO Take 300 mg by mouth at bedtime.   bisacodyl 5 MG EC tablet Commonly known as: DULCOLAX Take 5 mg by mouth daily as needed for moderate constipation.   cefUROXime 500 MG tablet Commonly known as: CEFTIN Take 1 tablet (500 mg total) by mouth 2 (two) times daily with a meal.   cetirizine 10 MG tablet Commonly known as: ZYRTEC Take 10 mg by mouth 2 (two) times daily.   clindamycin 1 % external solution Commonly known as: CLEOCIN T Apply to scalp once or twice a day prn bumps   cyanocobalamin 1000 MCG/ML injection Commonly known as: VITAMIN B12 INJECT 1 ML (1,000 MCG TOTAL) INTO THE MUSCLE EVERY 30 (THIRTY) DAYS. FOR B12 VITAMIN   desipramine 25 MG tablet Commonly known as: NORPRAMIN 2  qam   diphenhydrAMINE 2 %  cream Commonly known as: BENADRYL Apply topically 3 (three) times daily as needed for itching.   DULoxetine HCl 40 MG Cpep Take 0.5 capsules (20 mg total) by mouth 2 (two) times daily. 2 q day   DULoxetine 20 MG capsule Commonly known as: CYMBALTA TAKE 2 CAPSULES BY MOUTH EVERY DAY   estradiol 0.1 MG/GM vaginal cream Commonly known as: ESTRACE Estrogen Cream Instruction Discard applicator Apply pea sized amount to tip of finger to urethra before bed. Wash hands well after application. Use Monday, Wednesday and Friday   fenofibrate 145 MG tablet Commonly known as: TRICOR Take 1 tablet (145 mg total) by mouth daily. For cholesterol. Please call [phone] for an appointment.   FreeStyle Libre 3 Sensor Misc PLACE 1 SENSOR ON THE SKIN EVERY 14 DAYS. USE TO CHECK GLUCOSE CONTINUOUSLY   hydrOXYzine 50 MG capsule Commonly known as: VISTARIL Take 2 capsules (100 mg total) by mouth at bedtime. TAKE 1 CAPSLE BY MOUTH AT NOON, 1 AT 6 PM, AND 1 AS NEEDED   ibuprofen 200 MG tablet Commonly known as: ADVIL Take 400 mg by mouth 2 (two) times daily.   ketoconazole 2 %  shampoo Commonly known as: NIZORAL Apply 1 Application topically as directed. Wash from waist up 3 times a week for 2 months, then decrease to 1 time a month, let sit 5 minutes and rinse off   lamoTRIgine 150 MG tablet Commonly known as: LAMICTAL Take 2 tablets (300 mg total) by mouth daily at bedtime.   levalbuterol 45 MCG/ACT inhaler Commonly known as: XOPENEX HFA Inhale 2 puffs into the lungs every 4 (four) hours as needed for wheezing.   Lysine 500 MG Tabs Take 1,000 mg by mouth daily.   metFORMIN 500 MG tablet Commonly known as: GLUCOPHAGE Take by mouth.   methocarbamol 500 MG tablet Commonly known as: ROBAXIN TAKE 1-2 TABLETS (500-1,000 MG TOTAL) BY MOUTH AT BEDTIME AS NEEDED FOR MUSCLE SPASMS.   metoprolol tartrate 50 MG tablet Commonly known as: LOPRESSOR Take 1 tablet (50 mg total) by mouth 2 (two) times daily.   montelukast 10 MG tablet Commonly known as: SINGULAIR TAKE 1 TABLET BY MOUTH EVERYDAY AT BEDTIME   mupirocin ointment 2 % Commonly known as: BACTROBAN Apply 1 Application topically daily. Qd to wound on finger   neomycin-bacitracin-polymyxin [phone] ointment Apply 1 application topically 4 (four) times daily as needed (for cut/scrapes.).   NEOMYCIN-POLYMYXIN-HYDROCORTISONE 1 % Soln OTIC solution Commonly known as: CORTISPORIN Apply 1-2 drops to toe BID after soaking   Opzelura 1.5 % Crea Generic drug: Ruxolitinib Phosphate Apply to aa's rash QD PRN.   pantoprazole 40 MG tablet Commonly known as: PROTONIX TAKE 1 TABLET (40 MG TOTAL) BY MOUTH 2 (TWO) TIMES DAILY BEFORE A MEAL. FOR HEARTBURN   polyvinyl alcohol 1.4 % ophthalmic solution Commonly known as: LIQUIFILM TEARS Place 2 drops into both eyes at bedtime.   Repatha SureClick 140 MG/ML Soaj Generic drug: Evolocumab Inject 140 mg into the skin every 14 (fourteen) days.   Stiolto Respimat 2.5-2.5 MCG/ACT Aers Generic drug: Tiotropium Bromide-Olodaterol Inhale 2 puffs into the lungs at  bedtime.   temazepam 30 MG capsule Commonly known as: RESTORIL TAKE ONE TO TWO CAPSULES BY MOUTH NIGHTLY AT BEDTIME   traZODone 50 MG tablet Commonly known as: DESYREL Take 2 tablets (100 mg total) by mouth at bedtime.   Trulicity 3 MG/0.5ML Sopn Generic drug: Dulaglutide Inject 3 mg into the skin once a week.   valACYclovir 1000 MG tablet Commonly known  as: VALTREX Take 2 tablets by mouth twice daily for 1 day as needed for herpes outbreak.   vitamin D3 25 MCG tablet Commonly known as: CHOLECALCIFEROL Take 3,000 Units by mouth daily.        Allergies:  Allergies  Allergen Reactions   Albuterol Shortness Of Breath and Other (See Comments)    Makes pt feel jittery/ tacycardic   Crestor [Rosuvastatin] Other (See Comments)    Joint pain, muscle pain, and hair loss   Halcion [Triazolam] Other (See Comments)    Dizziness,headaches,bladder problems   Levaquin [Levofloxacin In D5w] Diarrhea and Itching    Shoulder pain   Metformin And Related Rash   Naproxen Sodium     Patient tolerates in small doses. Her reaction is swelling of the lower extremities.    Ozempic (0.25 Or 0.5 Mg-Dose) [Semaglutide(0.25 Or 0.5mg -Dos)] Itching   Cefaclor Other (See Comments)    Doesn't remember---unsure if actually allergic    Sulfa Antibiotics Itching    Unsure of reaction possibly itching   Tramadol Itching and Nausea And Vomiting   Zinc Other (See Comments)    constipation  constipation    Aripiprazole Other (See Comments)    Muscle tension/cramping   Diclofenac Sodium Rash    "made very sick"    Family History: Family History  Problem Relation Age of Onset   Cancer Mother    Heart disease Mother    Diabetes Mother    Sleep apnea Mother    Cancer Father        Abdomen with mastasis   High Cholesterol Father    Kidney disease Neg Hx    Bladder Cancer Neg Hx    Prostate cancer Neg Hx    Kidney cancer Neg Hx     Social History:  reports that she quit smoking about 5 years  ago. Her smoking use included cigarettes. She has a 25.00 pack-year smoking history. She has been exposed to tobacco smoke. She has never used smokeless tobacco. She reports current drug use. Drugs: Methylphenidate and Marijuana. She reports that she does not drink alcohol.  ROS: Pertinent ROS in HPI  Physical Exam: LMP 08/23/2014   Constitutional:  Well nourished. Alert and oriented, No acute distress. HEENT: [place] AT, moist mucus membranes.  Trachea midline, no masses. Cardiovascular: No clubbing, cyanosis, or edema. Respiratory: Normal respiratory effort, no increased work of breathing. GU: No CVA tenderness.  No bladder fullness or masses. Vulvovaginal atrophy w/ pallor, loss of rugae, introital retraction, excoriations.  Vulvar thinning, fusion of labia, clitoral hood retraction, prominent urethral meatus.   *** external genitalia, *** pubic hair distribution, no lesions.  Normal urethral meatus, no lesions, no prolapse, no discharge.   No urethral masses, tenderness and/or tenderness. No bladder fullness, tenderness or masses. *** vagina mucosa, *** estrogen effect, no discharge, no lesions, *** pelvic support, *** cystocele and *** rectocele noted.  No cervical motion tenderness.  Uterus is freely mobile and non-fixed.  No adnexal/parametria masses or tenderness noted.  Anus and perineum are without rashes or lesions.   ***  Neurologic: Grossly intact, no focal deficits, moving all 4 extremities. Psychiatric: Normal mood and affect.    Laboratory Data: Urinalysis See EPIC and HPI I have reviewed the labs.   Pertinent Imaging: Narrative & Impression  CLINICAL DATA:  Recurrent urinary tract infection   EXAM: RENAL / URINARY TRACT ULTRASOUND COMPLETE   COMPARISON:  CT abdomen pelvis 03/23/2022   FINDINGS: Right Kidney:   Renal measurements: 11.8 x 5.0  x 6.2 cm = volume: 192 mL. Echogenicity within normal limits. No mass or hydronephrosis visualized.   Left Kidney:   Renal  measurements: 11.3 x 5.3 x 5.8 cm = volume: 179 mL. Echogenicity within normal limits. No mass or hydronephrosis visualized.   Bladder:   Appears normal for degree of bladder distention.   Other:   None.   IMPRESSION: No hydronephrosis.     Electronically Signed   By: Annia Belt M.D.   On: 03/31/2023 15:07  I have reviewed the labs.   Assessment & Plan:    1. rUTI's -UA is grossly infected and sent for culture -Her feelings of incomplete bladder emptying are likely due to UTI -Continue doxycycline 100 mg twice daily until culture results are available  2. Gross hematuria -Had a workup in 2023 was negative for malignancies -UA with persistent microscopic hematuria -Urine sent for culture -Will schedule renal ultrasound for further evaluation -will recheck upon return   No follow-ups on file.  These notes generated with voice recognition software. I apologize for typographical errors.  Cloretta Ned  [facility] [address]  [address] [place], [place] [address] [phone]

## 2023-04-08 ENCOUNTER — Ambulatory Visit: Payer: PPO | Admitting: Urology

## 2023-04-08 DIAGNOSIS — R310 Gross hematuria: Secondary | ICD-10-CM

## 2023-04-08 DIAGNOSIS — N390 Urinary tract infection, site not specified: Secondary | ICD-10-CM

## 2023-04-12 NOTE — Progress Notes (Deleted)
  04/08/2023 10:24 PM   Emily Moon 10/20/1961 [id number]  Referring provider: Clark, Katherine K, NP [address] [place],  [place] [address]  Urological history: 1.  Nephrolithiasis -Bilateral URS (09/2016)  2. OAB -Contributing factors of multiple sclerosis, hypertension, sleep apnea (OSA), COPD, diabetes, obesity, depression, anxiety, memory issues, antihistamines and vaginal atrophy  3. High risk hematuria -former smoker -contrast CT (03/2022) pyelonephritis -cysto (07/2022) NED   No chief complaint on file.   HPI: Emily Moon is a 62 y.o. female who presents today for recheck on UTI.    On 01/06/2023 office visit, on Saturday, she couldn't pass urine.  She took 4 trimethoprim because she didn't know what else to do.  Patient denies any modifying or aggravating factors.  Patient denies any gross hematuria, dysuria or suprapubic/flank pain.  Patient denies any fevers, chills, nausea or vomiting.  UA yellow slightly cloudy, specific gravity 1.015, trace blood, pH 6.0, 3+ leukocyte, greater than 30 WBCs, 3-10 RBCs, 0-2 epithelial cells and moderate bacteria. PVR 0 mL.  Urine culture positive for E. coli and beta-hemolytic Streptococcus, group B.  She was treated with culture appropriate antibiotics.  On 03/25/2023, she states her symptoms abated when she took the antibiotic but then they returned on Sunday.  She is now experiencing frequency, gross hematuria and dysuria.  Patient denies any modifying or aggravating factors.  Patient denies any gross hematuria, dysuria or suprapubic/flank pain.  Patient denies any fevers, chills, nausea or vomiting.   She had doxycycline at home which she initiated prior to her appointment today.  Urinalysis yellow slightly cloudy, specific gravity greater than 1.030, 1+ blood, 5.5 pH, trace leukocyte, 11-30 WBCs, 3-10 RBCs, 0-10 epithelial cells, mucus threads are present and moderate bacteria.  Urine culture was positive for E.coli.   RUS was negative.    PMH: Past Medical History:  Diagnosis Date   Acute pyelonephritis 12/25/2021   Arthritis    osteo   Asthma    Bipolar disorder (HCC) 05/21/2014   Cataracts, bilateral    Cervical radiculitis    Chest pain    Chronic pain syndrome    COPD (chronic obstructive pulmonary disease) (HCC)    DDD (degenerative disc disease), cervical    also back   Depression    Diabetes mellitus without complication (HCC)    Dizziness    Positional   Edema    feet/legs   Epigastric pain    Fibromyalgia syndrome    Fungal infection    Finger nails   GERD (gastroesophageal reflux disease)    Gout    Heart palpitations    Hip dysplasia, 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   History of kidney stones    Hx of sepsis    Hypercholesterolemia    IDA (iron deficiency anemia)    Multiple sclerosis (HCC)    weakness   Osteoporosis    osteoarthritis   Pneumonia    PONV (postoperative nausea and vomiting)    no problem after cataract surgery   Prediabetes 07/22/2018   Psoriasis    Sciatica    Seasonal allergies    Shortness of breath dyspnea    wheezing   Sleep apnea 2012   sleep study / slight, no interventions   Urinary frequency    Vitamin B 12 deficiency    Weight gain 06/21/2014    Surgical History: Past Surgical History:  Procedure Laterality Date   CATARACT EXTRACTION W/PHACO Left 05/21/2015   Procedure: CATARACT EXTRACTION PHACO AND INTRAOCULAR LENS PLACEMENT (  IOC);  Surgeon: William Porfilio, MD;  Location: [facility];  Service: Ophthalmology;  Laterality: Left;  US 00:35 AP% 22.9 CDE 8.11 fluid pack lot #[id number]   CATARACT EXTRACTION W/PHACO Right 06/04/2015   Procedure: CATARACT EXTRACTION PHACO AND INTRAOCULAR LENS PLACEMENT (IOC);  Surgeon: William Porfilio, MD;  Location: [facility];  Service: Ophthalmology;  Laterality: Right;  US:00:48 AP%: 10.5 CDE:5.08 Fluid lot #[id number]   CYSTOSCOPY/URETEROSCOPY/HOLMIUM LASER/STENT PLACEMENT Bilateral 09/22/2016   Procedure:  CYSTOSCOPY/URETEROSCOPY/HOLMIUM LASER/STENT PLACEMENT;  Surgeon: Ashley Brandon, MD;  Location: [facility];  Service: Urology;  Laterality: Bilateral;   EYE SURGERY  2015   tissue biopsy   FOOT SURGERY  2015   JOINT REPLACEMENT Left 2013   hip replacement   LITHOTRIPSY     PTOSIS REPAIR Bilateral 02/18/2016   Procedure: BILATERAL PTOSIS REPAIR UPPER EYELIDS;  Surgeon: Amy M Fowler, MD;  Location: [facility];  Service: Ophthalmology;  Laterality: Bilateral;  LEAVE PT EARLY AM   thumb surgery Right    TONSILLECTOMY  1973   TOTAL HIP ARTHROPLASTY Right 07/02/2021   Procedure: TOTAL HIP ARTHROPLASTY ANTERIOR APPROACH;  Surgeon: Aluisio, Frank, MD;  Location: [facility];  Service: Orthopedics;  Laterality: Right;    Home Medications:  Allergies as of 04/08/2023       Reactions   Albuterol Shortness Of Breath, Other (See Comments)   Makes pt feel jittery/ tacycardic   Crestor [rosuvastatin] Other (See Comments)   Joint pain, muscle pain, and hair loss   Halcion [triazolam] Other (See Comments)   Dizziness,headaches,bladder problems   Levaquin [levofloxacin In D5w] Diarrhea, Itching   Shoulder pain   Metformin And Related Rash   Naproxen Sodium    Patient tolerates in small doses. Her reaction is swelling of the lower extremities.    Ozempic (0.25 Or 0.5 Mg-dose) [semaglutide(0.25 Or 0.5mg-dos)] Itching   Cefaclor Other (See Comments)   Doesn't remember---unsure if actually allergic    Sulfa Antibiotics Itching   Unsure of reaction possibly itching   Tramadol Itching, Nausea And Vomiting   Zinc Other (See Comments)   constipation  constipation    Aripiprazole Other (See Comments)   Muscle tension/cramping   Diclofenac Sodium Rash   "made very sick"        Medication List        Accurate as of Apr 07, 2023 10:24 PM. If you have any questions, ask your nurse or doctor.          aspirin EC 81 MG tablet Take 81 mg by mouth at bedtime. Swallow whole.   Azelastine HCl  137 MCG/SPRAY Soln PLACE 2 SPRAYS INTO BOTH NOSTRILS 2 (TWO) TIMES DAILY   BIG 100 (BIOTIN) PO Take 300 mg by mouth at bedtime.   bisacodyl 5 MG EC tablet Commonly known as: DULCOLAX Take 5 mg by mouth daily as needed for moderate constipation.   cefUROXime 500 MG tablet Commonly known as: CEFTIN Take 1 tablet (500 mg total) by mouth 2 (two) times daily with a meal.   cetirizine 10 MG tablet Commonly known as: ZYRTEC Take 10 mg by mouth 2 (two) times daily.   clindamycin 1 % external solution Commonly known as: CLEOCIN T Apply to scalp once or twice a day prn bumps   cyanocobalamin 1000 MCG/ML injection Commonly known as: VITAMIN B12 INJECT 1 ML (1,000 MCG TOTAL) INTO THE MUSCLE EVERY 30 (THIRTY) DAYS. FOR B12 VITAMIN   desipramine 25 MG tablet Commonly known as: NORPRAMIN 2  qam   diphenhydrAMINE 2 %   cream Commonly known as: BENADRYL Apply topically 3 (three) times daily as needed for itching.   DULoxetine HCl 40 MG Cpep Take 0.5 capsules (20 mg total) by mouth 2 (two) times daily. 2 q day   DULoxetine 20 MG capsule Commonly known as: CYMBALTA TAKE 2 CAPSULES BY MOUTH EVERY DAY   estradiol 0.1 MG/GM vaginal cream Commonly known as: ESTRACE Estrogen Cream Instruction Discard applicator Apply pea sized amount to tip of finger to urethra before bed. Wash hands well after application. Use Monday, Wednesday and Friday   fenofibrate 145 MG tablet Commonly known as: TRICOR Take 1 tablet (145 mg total) by mouth daily. For cholesterol. Please call [phone] for an appointment.   FreeStyle Libre 3 Sensor Misc PLACE 1 SENSOR ON THE SKIN EVERY 14 DAYS. USE TO CHECK GLUCOSE CONTINUOUSLY   hydrOXYzine 50 MG capsule Commonly known as: VISTARIL Take 2 capsules (100 mg total) by mouth at bedtime. TAKE 1 CAPSLE BY MOUTH AT NOON, 1 AT 6 PM, AND 1 AS NEEDED   ibuprofen 200 MG tablet Commonly known as: ADVIL Take 400 mg by mouth 2 (two) times daily.   ketoconazole 2 %  shampoo Commonly known as: NIZORAL Apply 1 Application topically as directed. Wash from waist up 3 times a week for 2 months, then decrease to 1 time a month, let sit 5 minutes and rinse off   lamoTRIgine 150 MG tablet Commonly known as: LAMICTAL Take 2 tablets (300 mg total) by mouth daily at bedtime.   levalbuterol 45 MCG/ACT inhaler Commonly known as: XOPENEX HFA Inhale 2 puffs into the lungs every 4 (four) hours as needed for wheezing.   Lysine 500 MG Tabs Take 1,000 mg by mouth daily.   metFORMIN 500 MG tablet Commonly known as: GLUCOPHAGE Take by mouth.   methocarbamol 500 MG tablet Commonly known as: ROBAXIN TAKE 1-2 TABLETS (500-1,000 MG TOTAL) BY MOUTH AT BEDTIME AS NEEDED FOR MUSCLE SPASMS.   metoprolol tartrate 50 MG tablet Commonly known as: LOPRESSOR Take 1 tablet (50 mg total) by mouth 2 (two) times daily.   montelukast 10 MG tablet Commonly known as: SINGULAIR TAKE 1 TABLET BY MOUTH EVERYDAY AT BEDTIME   mupirocin ointment 2 % Commonly known as: BACTROBAN Apply 1 Application topically daily. Qd to wound on finger   neomycin-bacitracin-polymyxin 5-400-5000 ointment Apply 1 application topically 4 (four) times daily as needed (for cut/scrapes.).   NEOMYCIN-POLYMYXIN-HYDROCORTISONE 1 % Soln OTIC solution Commonly known as: CORTISPORIN Apply 1-2 drops to toe BID after soaking   Opzelura 1.5 % Crea Generic drug: Ruxolitinib Phosphate Apply to aa's rash QD PRN.   pantoprazole 40 MG tablet Commonly known as: PROTONIX TAKE 1 TABLET (40 MG TOTAL) BY MOUTH 2 (TWO) TIMES DAILY BEFORE A MEAL. FOR HEARTBURN   polyvinyl alcohol 1.4 % ophthalmic solution Commonly known as: LIQUIFILM TEARS Place 2 drops into both eyes at bedtime.   Repatha SureClick 140 MG/ML Soaj Generic drug: Evolocumab Inject 140 mg into the skin every 14 (fourteen) days.   Stiolto Respimat 2.5-2.5 MCG/ACT Aers Generic drug: Tiotropium Bromide-Olodaterol Inhale 2 puffs into the lungs at  bedtime.   temazepam 30 MG capsule Commonly known as: RESTORIL TAKE ONE TO TWO CAPSULES BY MOUTH NIGHTLY AT BEDTIME   traZODone 50 MG tablet Commonly known as: DESYREL Take 2 tablets (100 mg total) by mouth at bedtime.   Trulicity 3 MG/0.5ML Sopn Generic drug: Dulaglutide Inject 3 mg into the skin once a week.   valACYclovir 1000 MG tablet Commonly known   as: VALTREX Take 2 tablets by mouth twice daily for 1 day as needed for herpes outbreak.   vitamin D3 25 MCG tablet Commonly known as: CHOLECALCIFEROL Take 3,000 Units by mouth daily.        Allergies:  Allergies  Allergen Reactions   Albuterol Shortness Of Breath and Other (See Comments)    Makes pt feel jittery/ tacycardic   Crestor [Rosuvastatin] Other (See Comments)    Joint pain, muscle pain, and hair loss   Halcion [Triazolam] Other (See Comments)    Dizziness,headaches,bladder problems   Levaquin [Levofloxacin In D5w] Diarrhea and Itching    Shoulder pain   Metformin And Related Rash   Naproxen Sodium     Patient tolerates in small doses. Her reaction is swelling of the lower extremities.    Ozempic (0.25 Or 0.5 Mg-Dose) [Semaglutide(0.25 Or 0.5mg-Dos)] Itching   Cefaclor Other (See Comments)    Doesn't remember---unsure if actually allergic    Sulfa Antibiotics Itching    Unsure of reaction possibly itching   Tramadol Itching and Nausea And Vomiting   Zinc Other (See Comments)    constipation  constipation    Aripiprazole Other (See Comments)    Muscle tension/cramping   Diclofenac Sodium Rash    "made very sick"    Family History: Family History  Problem Relation Age of Onset   Cancer Mother    Heart disease Mother    Diabetes Mother    Sleep apnea Mother    Cancer Father        Abdomen with mastasis   High Cholesterol Father    Kidney disease Neg Hx    Bladder Cancer Neg Hx    Prostate cancer Neg Hx    Kidney cancer Neg Hx     Social History:  reports that she quit smoking about 5 years  ago. Her smoking use included cigarettes. She has a 25.00 pack-year smoking history. She has been exposed to tobacco smoke. She has never used smokeless tobacco. She reports current drug use. Drugs: Methylphenidate and Marijuana. She reports that she does not drink alcohol.  ROS: Pertinent ROS in HPI  Physical Exam: LMP 08/23/2014   Constitutional:  Well nourished. Alert and oriented, No acute distress. HEENT: [place] AT, moist mucus membranes.  Trachea midline, no masses. Cardiovascular: No clubbing, cyanosis, or edema. Respiratory: Normal respiratory effort, no increased work of breathing. GU: No CVA tenderness.  No bladder fullness or masses. Vulvovaginal atrophy w/ pallor, loss of rugae, introital retraction, excoriations.  Vulvar thinning, fusion of labia, clitoral hood retraction, prominent urethral meatus.   *** external genitalia, *** pubic hair distribution, no lesions.  Normal urethral meatus, no lesions, no prolapse, no discharge.   No urethral masses, tenderness and/or tenderness. No bladder fullness, tenderness or masses. *** vagina mucosa, *** estrogen effect, no discharge, no lesions, *** pelvic support, *** cystocele and *** rectocele noted.  No cervical motion tenderness.  Uterus is freely mobile and non-fixed.  No adnexal/parametria masses or tenderness noted.  Anus and perineum are without rashes or lesions.   ***  Neurologic: Grossly intact, no focal deficits, moving all 4 extremities. Psychiatric: Normal mood and affect.    Laboratory Data: Urinalysis See EPIC and HPI I have reviewed the labs.   Pertinent Imaging: Narrative & Impression  CLINICAL DATA:  Recurrent urinary tract infection   EXAM: RENAL / URINARY TRACT ULTRASOUND COMPLETE   COMPARISON:  CT abdomen pelvis 03/23/2022   FINDINGS: Right Kidney:   Renal measurements: 11.8 x 5.0   x 6.2 cm = volume: 192 mL. Echogenicity within normal limits. No mass or hydronephrosis visualized.   Left Kidney:   Renal  measurements: 11.3 x 5.3 x 5.8 cm = volume: 179 mL. Echogenicity within normal limits. No mass or hydronephrosis visualized.   Bladder:   Appears normal for degree of bladder distention.   Other:   None.   IMPRESSION: No hydronephrosis.     Electronically Signed   By: Drew  Davis M.D.   On: 03/31/2023 15:07  I have reviewed the labs.   Assessment & Plan:    1. rUTI's -UA is grossly infected and sent for culture -Her feelings of incomplete bladder emptying are likely due to UTI -Continue doxycycline 100 mg twice daily until culture results are available  2. Gross hematuria -Had a workup in 2023 was negative for malignancies -UA with persistent microscopic hematuria -Urine sent for culture -Will schedule renal ultrasound for further evaluation -will recheck upon return   No follow-ups on file.  These notes generated with voice recognition software. I apologize for typographical errors.  Laurie Lovejoy, PA-C  [facility] [address]  [address] [place], [place] [address] [phone] 

## 2023-04-13 ENCOUNTER — Ambulatory Visit: Payer: PPO | Admitting: Urology

## 2023-04-13 ENCOUNTER — Ambulatory Visit (HOSPITAL_COMMUNITY): Payer: PPO | Admitting: Psychiatry

## 2023-04-13 DIAGNOSIS — R310 Gross hematuria: Secondary | ICD-10-CM

## 2023-04-13 DIAGNOSIS — N390 Urinary tract infection, site not specified: Secondary | ICD-10-CM

## 2023-04-15 ENCOUNTER — Encounter: Payer: Self-pay | Admitting: Internal Medicine

## 2023-04-15 ENCOUNTER — Ambulatory Visit (INDEPENDENT_AMBULATORY_CARE_PROVIDER_SITE_OTHER): Payer: PPO | Admitting: Internal Medicine

## 2023-04-15 VITALS — BP 110/76 | HR 93 | Temp 97.2°F | Ht 69.0 in | Wt 244.0 lb

## 2023-04-15 DIAGNOSIS — I471 Supraventricular tachycardia, unspecified: Secondary | ICD-10-CM

## 2023-04-15 DIAGNOSIS — E1165 Type 2 diabetes mellitus with hyperglycemia: Secondary | ICD-10-CM | POA: Diagnosis not present

## 2023-04-15 DIAGNOSIS — G35 Multiple sclerosis: Secondary | ICD-10-CM | POA: Diagnosis not present

## 2023-04-15 DIAGNOSIS — Z7985 Long-term (current) use of injectable non-insulin antidiabetic drugs: Secondary | ICD-10-CM | POA: Diagnosis not present

## 2023-04-15 DIAGNOSIS — F310 Bipolar disorder, current episode hypomanic: Secondary | ICD-10-CM

## 2023-04-15 DIAGNOSIS — Z7984 Long term (current) use of oral hypoglycemic drugs: Secondary | ICD-10-CM

## 2023-04-15 DIAGNOSIS — J4489 Other specified chronic obstructive pulmonary disease: Secondary | ICD-10-CM | POA: Diagnosis not present

## 2023-04-15 LAB — HM DIABETES FOOT EXAM

## 2023-04-15 MED ORDER — TRULICITY 3 MG/0.5ML ~~LOC~~ SOAJ: 3.0000 mg | SUBCUTANEOUS | 1 refills | Status: DC

## 2023-04-15 NOTE — Assessment & Plan Note (Signed)
Just uses the metformin for this Doesn't want new biologics

## 2023-04-15 NOTE — Assessment & Plan Note (Signed)
Dr Donell Beers treats [facility] if she could try off the desipramine Lamotrigine/duloxetine/trazodone

## 2023-04-15 NOTE — Assessment & Plan Note (Signed)
Lab Results  Component Value Date   HGBA1C 6.6 (H) 03/23/2023    Tolerating trulicity 1.5mg  but not able to lose weight --would like to try higher dose Metformin bid (for MS)

## 2023-04-15 NOTE — Assessment & Plan Note (Signed)
Controlled with metoprolol 50  bid

## 2023-04-15 NOTE — Assessment & Plan Note (Signed)
Doing better with stiolto

## 2023-04-15 NOTE — Progress Notes (Signed)
Subjective:    Patient ID: Emily Moon, female    DOB: 01-28-1961, 62 y.o.   MRN: [id number]  HPI Here for follow up of diabetes and other chronic health conditions  Sugars have been better Has CGM---average is 127 and time in range 64% Did have "crash" a while back--got alarms from her system and took some juice Some neuropathy---but not really sig pain Changed desipramine to bedtime---on by Dr Donell Beers (depression also)  On lamictal for bipolar disease Still has some mania Duloxetine for depression  Metoprolol for tachycardia No palpitations when she takes it  Current Outpatient Medications on File Prior to Visit  Medication Sig Dispense Refill   aspirin EC 81 MG tablet Take 81 mg by mouth at bedtime. Swallow whole.     Azelastine HCl 137 MCG/SPRAY SOLN PLACE 2 SPRAYS INTO BOTH NOSTRILS 2 (TWO) TIMES DAILY 30 mL 0   B Complex-Biotin-FA (BIG 100, BIOTIN, PO) Take 300 mg by mouth at bedtime.     bisacodyl (DULCOLAX) 5 MG EC tablet Take 5 mg by mouth daily as needed for moderate constipation.     cetirizine (ZYRTEC) 10 MG tablet Take 10 mg by mouth daily.     clindamycin (CLEOCIN T) 1 % external solution Apply to scalp once or twice a day prn bumps 30 mL 2   Continuous Glucose Sensor (FREESTYLE LIBRE 3 SENSOR) MISC PLACE 1 SENSOR ON THE SKIN EVERY 14 DAYS. USE TO CHECK GLUCOSE CONTINUOUSLY 6 each 1   cyanocobalamin (VITAMIN B12) 1000 MCG/ML injection INJECT 1 ML (1,000 MCG TOTAL) INTO THE MUSCLE EVERY 30 (THIRTY) DAYS. FOR B12 VITAMIN 3 mL 0   desipramine (NORPRAMIN) 25 MG tablet 2  qam 60 tablet 6   diphenhydrAMINE (BENADRYL) 2 % cream Apply topically 3 (three) times daily as needed for itching.     Dulaglutide (TRULICITY) 3 MG/0.5ML SOPN Inject 3 mg into the skin once a week.     DULoxetine (CYMBALTA) 20 MG capsule TAKE 2 CAPSULES BY MOUTH EVERY DAY 180 capsule 2   estradiol (ESTRACE) 0.1 MG/GM vaginal cream Estrogen Cream Instruction Discard applicator Apply pea sized  amount to tip of finger to urethra before bed. Wash hands well after application. Use Monday, Wednesday and Friday 42.5 g 11   Evolocumab (REPATHA SURECLICK) 140 MG/ML SOAJ Inject 140 mg into the skin every 14 (fourteen) days. 2 mL 5   fenofibrate (TRICOR) 145 MG tablet Take 1 tablet (145 mg total) by mouth daily. For cholesterol. Please call [phone] for an appointment. 90 tablet 3   hydrOXYzine (VISTARIL) 50 MG capsule Take 2 capsules (100 mg total) by mouth at bedtime. TAKE 1 CAPSLE BY MOUTH AT NOON, 1 AT 6 PM, AND 1 AS NEEDED 90 capsule 3   ibuprofen (ADVIL) 200 MG tablet Take 400 mg by mouth 2 (two) times daily.     ketoconazole (NIZORAL) 2 % shampoo Apply 1 Application topically as directed. Wash from waist up 3 times a week for 2 months, then decrease to 1 time a month, let sit 5 minutes and rinse off 120 mL 6   lamoTRIgine (LAMICTAL) 150 MG tablet Take 2 tablets (300 mg total) by mouth daily at bedtime. 60 tablet 6   levalbuterol (XOPENEX HFA) 45 MCG/ACT inhaler Inhale 2 puffs into the lungs every 4 (four) hours as needed for wheezing.     Lysine 500 MG TABS Take 1,000 mg by mouth daily.     metFORMIN (GLUCOPHAGE) 500 MG tablet Take 1,000 mg by  mouth.     methocarbamol (ROBAXIN) 500 MG tablet TAKE 1-2 TABLETS (500-1,000 MG TOTAL) BY MOUTH AT BEDTIME AS NEEDED FOR MUSCLE SPASMS. 180 tablet 0   metoprolol tartrate (LOPRESSOR) 50 MG tablet Take 1 tablet (50 mg total) by mouth 2 (two) times daily. 180 tablet 3   montelukast (SINGULAIR) 10 MG tablet TAKE 1 TABLET BY MOUTH EVERYDAY AT BEDTIME 90 tablet 1   mupirocin ointment (BACTROBAN) 2 % Apply 1 Application topically daily. Qd to wound on finger 22 g 1   neomycin-bacitracin-polymyxin (NEOSPORIN) [phone] ointment Apply 1 application topically 4 (four) times daily as needed (for cut/scrapes.).     NEOMYCIN-POLYMYXIN-HYDROCORTISONE (CORTISPORIN) 1 % SOLN OTIC solution Apply 1-2 drops to toe BID after soaking 10 mL 1   pantoprazole (PROTONIX)  40 MG tablet TAKE 1 TABLET (40 MG TOTAL) BY MOUTH 2 (TWO) TIMES DAILY BEFORE A MEAL. FOR HEARTBURN 180 tablet 3   polyvinyl alcohol (LIQUIFILM TEARS) 1.4 % ophthalmic solution Place 2 drops into both eyes at bedtime.     Ruxolitinib Phosphate (OPZELURA) 1.5 % CREA Apply to aa's rash QD PRN. 60 g 3   STIOLTO RESPIMAT 2.5-2.5 MCG/ACT AERS Inhale 2 puffs into the lungs at bedtime.     temazepam (RESTORIL) 30 MG capsule TAKE ONE TO TWO CAPSULES BY MOUTH NIGHTLY AT BEDTIME 60 capsule 5   traZODone (DESYREL) 50 MG tablet Take 2 tablets (100 mg total) by mouth at bedtime. 60 tablet 7   valACYclovir (VALTREX) 1000 MG tablet Take 2 tablets by mouth twice daily for 1 day as needed for herpes outbreak. 12 tablet 0   Vitamin D3 (VITAMIN D) 25 MCG tablet Take 3,000 Units by mouth daily.     No current facility-administered medications on file prior to visit.    Allergies  Allergen Reactions   Albuterol Shortness Of Breath and Other (See Comments)    Makes pt feel jittery/ tacycardic   Crestor [Rosuvastatin] Other (See Comments)    Joint pain, muscle pain, and hair loss   Halcion [Triazolam] Other (See Comments)    Dizziness,headaches,bladder problems   Levaquin [Levofloxacin In D5w] Diarrhea and Itching    Shoulder pain   Metformin And Related Rash   Naproxen Sodium     Patient tolerates in small doses. Her reaction is swelling of the lower extremities.    Ozempic (0.25 Or 0.5 Mg-Dose) [Semaglutide(0.25 Or 0.5mg -Dos)] Itching   Cefaclor Other (See Comments)    Doesn't remember---unsure if actually allergic    Sulfa Antibiotics Itching    Unsure of reaction possibly itching   Tramadol Itching and Nausea And Vomiting   Zinc Other (See Comments)    constipation  constipation    Aripiprazole Other (See Comments)    Muscle tension/cramping   Diclofenac Sodium Rash    "made very sick"    Past Medical History:  Diagnosis Date   Acute pyelonephritis 12/25/2021   Arthritis    osteo   Asthma     Bipolar disorder (HCC) 05/21/2014   Cataracts, bilateral    Cervical radiculitis    Chest pain    Chronic pain syndrome    COPD (chronic obstructive pulmonary disease) (HCC)    DDD (degenerative disc disease), cervical    also back   Depression    Diabetes mellitus without complication (HCC)    Dizziness    Positional   Edema    feet/legs   Epigastric pain    Fibromyalgia syndrome    Fungal infection    Finger  nails   GERD (gastroesophageal reflux disease)    Gout    Heart palpitations    Hip dysplasia, 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   History of kidney stones    Hx of sepsis    Hypercholesterolemia    IDA (iron deficiency anemia)    Multiple sclerosis (HCC)    weakness   Osteoporosis    osteoarthritis   Pneumonia    PONV (postoperative nausea and vomiting)    no problem after cataract surgery   Prediabetes 07/22/2018   Psoriasis    Sciatica    Seasonal allergies    Shortness of breath dyspnea    wheezing   Sleep apnea 2012   sleep study / slight, no interventions   Urinary frequency    Vitamin B 12 deficiency    Weight gain 06/21/2014    Past Surgical History:  Procedure Laterality Date   CATARACT EXTRACTION W/PHACO Left 05/21/2015   Procedure: CATARACT EXTRACTION PHACO AND INTRAOCULAR LENS PLACEMENT (IOC);  Surgeon: Galen Manila, MD;  Location: [facility];  Service: Ophthalmology;  Laterality: Left;  [place] 00:35 AP% 22.9 CDE 8.11 fluid pack lot #[id number]   CATARACT EXTRACTION W/PHACO Right 06/04/2015   Procedure: CATARACT EXTRACTION PHACO AND INTRAOCULAR LENS PLACEMENT (IOC);  Surgeon: Galen Manila, MD;  Location: [facility];  Service: Ophthalmology;  Laterality: Right;  US:00:48 AP%: 10.5 CDE:5.08 Fluid lot #[id number]   CYSTOSCOPY/URETEROSCOPY/HOLMIUM LASER/STENT PLACEMENT Bilateral 09/22/2016   Procedure: CYSTOSCOPY/URETEROSCOPY/HOLMIUM LASER/STENT PLACEMENT;  Surgeon: Vanna Scotland, MD;  Location: [facility];  Service: Urology;  Laterality: Bilateral;   EYE  SURGERY  2015   tissue biopsy   FOOT SURGERY  2015   JOINT REPLACEMENT Left 2013   hip replacement   LITHOTRIPSY     PTOSIS REPAIR Bilateral 02/18/2016   Procedure: BILATERAL PTOSIS REPAIR UPPER EYELIDS;  Surgeon: Imagene Riches, MD;  Location: [facility];  Service: Ophthalmology;  Laterality: Bilateral;  LEAVE PT EARLY AM   thumb surgery Right    TONSILLECTOMY  1973   TOTAL HIP ARTHROPLASTY Right 07/02/2021   Procedure: TOTAL HIP ARTHROPLASTY ANTERIOR APPROACH;  Surgeon: Ollen Gross, MD;  Location: [facility];  Service: Orthopedics;  Laterality: Right;    Family History  Problem Relation Age of Onset   Cancer Mother    Heart disease Mother    Diabetes Mother    Sleep apnea Mother    Cancer Father        Abdomen with mastasis   High Cholesterol Father    Kidney disease Neg Hx    Bladder Cancer Neg Hx    Prostate cancer Neg Hx    Kidney cancer Neg Hx     Social History   Socioeconomic History   Marital status: Divorced    Spouse name: Not on file   Number of children: 1   Years of education: Not on file   Highest education level: Not on file  Occupational History   Occupation: Clinical biochemist Rep at [employer]    Employer: OTHER   Occupation: Disability  Tobacco Use   Smoking status: Former    Packs/day: 1.00    Years: 25.00    Additional pack years: 0.00    Total pack years: 25.00    Types: Cigarettes    Quit date: 2019    Years since quitting: 5.4    Passive exposure: Past   Smokeless tobacco: Never   Tobacco comments:    occasional use  Vaping Use   Vaping Use: Former  Substance and Sexual Activity  Alcohol use: No    Alcohol/week: 0.0 standard drinks of alcohol   Drug use: Yes    Types: Methylphenidate, Marijuana    Comment: Delta 8 - once per day   Sexual activity: Never  Other Topics Concern   Not on file  Social History Narrative   Not on file   Social Determinants of Health   Financial Resource Strain: Low Risk  (10/01/2022)   Overall  Financial Resource Strain (CARDIA)    Difficulty of Paying Living Expenses: Not very hard  Food Insecurity: No Food Insecurity (10/01/2022)   Hunger Vital Sign    Worried About Running Out of Food in the Last Year: Never true    Ran Out of Food in the Last Year: Never true  Transportation Needs: No Transportation Needs (10/01/2022)   PRAPARE - Administrator, Civil Service (Medical): No    Lack of Transportation (Non-Medical): No  Physical Activity: Insufficiently Active (10/01/2022)   Exercise Vital Sign    Days of Exercise per Week: 2 days    Minutes of Exercise per Session: 20 min  Stress: No Stress Concern Present (10/01/2022)   [employer] of Occupational Health - Occupational Stress Questionnaire    Feeling of Stress : Only a little  Social Connections: Socially Isolated (10/01/2022)   Social Connection and Isolation Panel [NHANES]    Frequency of Communication with Friends and Family: Three times a week    Frequency of Social Gatherings with Friends and Family: Three times a week    Attends Religious Services: Never    Active Member of Clubs or Organizations: No    Attends Banker Meetings: Never    Marital Status: Divorced  Catering manager Violence: Not At Risk (10/01/2022)   Humiliation, Afraid, Rape, and Kick questionnaire    Fear of Current or Ex-Partner: No    Emotionally Abused: No    Physically Abused: No    Sexually Abused: No   Review of Systems Weight fairly stable on trulicity Doing intermittent fasting---very light AM eating (to take metformin) and one main meal (from neurologist for MS) Doesn't sleep unless she takes the temazepam and trazodone/desipramine Needs the methocarbamol for RLS/cramps    Objective:   Physical Exam Constitutional:      Appearance: Normal appearance.  Cardiovascular:     Rate and Rhythm: Normal rate and regular rhythm.     Pulses: Normal pulses.     Heart sounds: No murmur heard.    No gallop.   Pulmonary:     Effort: Pulmonary effort is normal.     Breath sounds: Normal breath sounds. No wheezing or rales.  Musculoskeletal:     Cervical back: Neck supple.     Right lower leg: No edema.     Left lower leg: No edema.  Lymphadenopathy:     Cervical: No cervical adenopathy.  Skin:    Comments: No foot lesions  Neurological:     Mental Status: She is alert.     Comments: Decreased sensation in feet            Assessment & Plan:

## 2023-04-18 ENCOUNTER — Encounter: Payer: Self-pay | Admitting: Podiatry

## 2023-04-19 ENCOUNTER — Ambulatory Visit: Payer: PPO | Admitting: Podiatry

## 2023-04-19 ENCOUNTER — Other Ambulatory Visit: Payer: Self-pay | Admitting: Primary Care

## 2023-04-19 DIAGNOSIS — J3089 Other allergic rhinitis: Secondary | ICD-10-CM

## 2023-04-20 ENCOUNTER — Ambulatory Visit (INDEPENDENT_AMBULATORY_CARE_PROVIDER_SITE_OTHER): Payer: PPO | Admitting: Podiatry

## 2023-04-20 DIAGNOSIS — L03031 Cellulitis of right toe: Secondary | ICD-10-CM | POA: Diagnosis not present

## 2023-04-20 DIAGNOSIS — L600 Ingrowing nail: Secondary | ICD-10-CM

## 2023-04-20 DIAGNOSIS — E119 Type 2 diabetes mellitus without complications: Secondary | ICD-10-CM | POA: Diagnosis not present

## 2023-04-20 MED ORDER — AMOXICILLIN-POT CLAVULANATE 875-125 MG PO TABS: 1.0000 | ORAL_TABLET | Freq: Two times a day (BID) | ORAL | 0 refills | Status: DC

## 2023-04-20 NOTE — Progress Notes (Signed)
04/21/2023 10:15 AM   Emily Moon 1961-03-05 [id number]  Referring provider: Doreene Nest, NP [address] [place],  [place] [address]  Urological history: 1.  Nephrolithiasis -Bilateral URS (09/2016)  2. OAB -Contributing factors of multiple sclerosis, hypertension, sleep apnea (OSA), COPD, diabetes, obesity, depression, anxiety, memory issues, antihistamines and vaginal atrophy  3. High risk hematuria -former smoker -contrast CT (03/2022) pyelonephritis -cysto (07/2022) NED   Chief Complaint  Patient presents with   Follow-up    HPI: Emily Moon is a 62 y.o. female who presents today for recheck on UTI.    On 01/06/2023 office visit, on Saturday, she couldn't pass urine.  She took 4 trimethoprim because she didn't know what else to do.  Patient denies any modifying or aggravating factors.  Patient denies any gross hematuria, dysuria or suprapubic/flank pain.  Patient denies any fevers, chills, nausea or vomiting.  UA yellow slightly cloudy, specific gravity 1.015, trace blood, pH 6.0, 3+ leukocyte, greater than 30 WBCs, 3-10 RBCs, 0-2 epithelial cells and moderate bacteria.  PVR 0 mL.  Urine culture positive for E. coli and beta-hemolytic Streptococcus, group B.  She was treated with culture appropriate antibiotics.  On 03/25/2023, she states her symptoms abated when she took the antibiotic but then they returned on Sunday.  She is now experiencing frequency, gross hematuria and dysuria.  Patient denies any modifying or aggravating factors.  Patient denies any gross hematuria, dysuria or suprapubic/flank pain.  Patient denies any fevers, chills, nausea or vomiting.   She had doxycycline at home which she initiated prior to her appointment today.  Urinalysis yellow slightly cloudy, specific gravity greater than 1.030, 1+ blood, 5.5 pH, trace leukocyte, 11-30 WBCs, 3-10 RBCs, 0-10 epithelial cells, mucus threads are present and moderate bacteria.  Urine culture  was positive for E.coli.  RUS was negative.   She is having 8 or more daytime urinations, 1-2 episodes of nocturia with a strong urge to urinate.  She is having urinary leakage 3 more times a week.  She is going through 2 absorbent pads daily.  She engages in toilet mapping.  Patient denies any modifying or aggravating factors.  Patient denies any gross hematuria, dysuria or suprapubic/flank pain.  Patient denies any fevers, chills, nausea or vomiting.    Urinalysis yellow clear, specific gravity 1.025, pH 5.5, leukocyte trace, WBC 0-5, RBC 0-2, 0-10 epithelial cells, mucus threads are present and moderate bacteria.  PVR 46 mL   She would like to try PTNS therapy again.   PMH: Past Medical History:  Diagnosis Date   Acute pyelonephritis 12/25/2021   Arthritis    osteo   Asthma    Bipolar disorder (HCC) 05/21/2014   Cataracts, bilateral    Cervical radiculitis    Chest pain    Chronic pain syndrome    COPD (chronic obstructive pulmonary disease) (HCC)    DDD (degenerative disc disease), cervical    also back   Depression    Diabetes mellitus without complication (HCC)    Dizziness    Positional   Edema    feet/legs   Epigastric pain    Fibromyalgia syndrome    Fungal infection    Finger nails   GERD (gastroesophageal reflux disease)    Gout    Heart palpitations    Hip dysplasia, 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   History of kidney stones    Hx of sepsis    Hypercholesterolemia    IDA (iron deficiency anemia)  Multiple sclerosis (HCC)    weakness   Osteoporosis    osteoarthritis   Pneumonia    PONV (postoperative nausea and vomiting)    no problem after cataract surgery   Prediabetes 07/22/2018   Psoriasis    Sciatica    Seasonal allergies    Shortness of breath dyspnea    wheezing   Sleep apnea 2012   sleep study / slight, no interventions   Urinary frequency    Vitamin B 12 deficiency    Weight gain 06/21/2014    Surgical History: Past Surgical History:   Procedure Laterality Date   CATARACT EXTRACTION W/PHACO Left 05/21/2015   Procedure: CATARACT EXTRACTION PHACO AND INTRAOCULAR LENS PLACEMENT (IOC);  Surgeon: Galen Manila, MD;  Location: [facility];  Service: Ophthalmology;  Laterality: Left;  [place] 00:35 AP% 22.9 CDE 8.11 fluid pack lot #[id number]   CATARACT EXTRACTION W/PHACO Right 06/04/2015   Procedure: CATARACT EXTRACTION PHACO AND INTRAOCULAR LENS PLACEMENT (IOC);  Surgeon: Galen Manila, MD;  Location: [facility];  Service: Ophthalmology;  Laterality: Right;  US:00:48 AP%: 10.5 CDE:5.08 Fluid lot #[id number]   CYSTOSCOPY/URETEROSCOPY/HOLMIUM LASER/STENT PLACEMENT Bilateral 09/22/2016   Procedure: CYSTOSCOPY/URETEROSCOPY/HOLMIUM LASER/STENT PLACEMENT;  Surgeon: Vanna Scotland, MD;  Location: [facility];  Service: Urology;  Laterality: Bilateral;   EYE SURGERY  2015   tissue biopsy   FOOT SURGERY  2015   JOINT REPLACEMENT Left 2013   hip replacement   LITHOTRIPSY     PTOSIS REPAIR Bilateral 02/18/2016   Procedure: BILATERAL PTOSIS REPAIR UPPER EYELIDS;  Surgeon: Imagene Riches, MD;  Location: [facility];  Service: Ophthalmology;  Laterality: Bilateral;  LEAVE PT EARLY AM   thumb surgery Right    TONSILLECTOMY  1973   TOTAL HIP ARTHROPLASTY Right 07/02/2021   Procedure: TOTAL HIP ARTHROPLASTY ANTERIOR APPROACH;  Surgeon: Ollen Gross, MD;  Location: [facility];  Service: Orthopedics;  Laterality: Right;    Home Medications:  Allergies as of 04/21/2023       Reactions   Albuterol Shortness Of Breath, Other (See Comments)   Makes pt feel jittery/ tacycardic   Crestor [rosuvastatin] Other (See Comments)   Joint pain, muscle pain, and hair loss   Halcion [triazolam] Other (See Comments)   Dizziness,headaches,bladder problems   Levaquin [levofloxacin In D5w] Diarrhea, Itching   Shoulder pain   Metformin And Related Rash   Naproxen Sodium    Patient tolerates in small doses. Her reaction is swelling of the lower extremities.     Ozempic (0.25 Or 0.5 Mg-dose) [semaglutide(0.25 Or 0.5mg -dos)] Itching   Cefaclor Other (See Comments)   Doesn't remember---unsure if actually allergic    Sulfa Antibiotics Itching   Unsure of reaction possibly itching   Tramadol Itching, Nausea And Vomiting   Zinc Other (See Comments)   constipation  constipation    Aripiprazole Other (See Comments)   Muscle tension/cramping   Diclofenac Sodium Rash   "made very sick"        Medication List        Accurate as of April 21, 2023 11:59 PM. If you have any questions, ask your nurse or doctor.          amoxicillin-clavulanate 875-125 MG tablet Commonly known as: AUGMENTIN Take 1 tablet by mouth 2 (two) times daily.   aspirin EC 81 MG tablet Take 81 mg by mouth at bedtime. Swallow whole.   Azelastine HCl 137 MCG/SPRAY Soln PLACE 2 SPRAYS INTO BOTH NOSTRILS 2 (TWO) TIMES DAILY   BIG 100 (BIOTIN) PO Take  300 mg by mouth at bedtime.   bisacodyl 5 MG EC tablet Commonly known as: DULCOLAX Take 5 mg by mouth daily as needed for moderate constipation.   cetirizine 10 MG tablet Commonly known as: ZYRTEC Take 10 mg by mouth daily.   clindamycin 1 % external solution Commonly known as: CLEOCIN T Apply to scalp once or twice a day prn bumps   cyanocobalamin 1000 MCG/ML injection Commonly known as: VITAMIN B12 INJECT 1 ML (1,000 MCG TOTAL) INTO THE MUSCLE EVERY 30 (THIRTY) DAYS. FOR B12 VITAMIN   desipramine 25 MG tablet Commonly known as: NORPRAMIN 2  qam   diphenhydrAMINE 2 % cream Commonly known as: BENADRYL Apply topically 3 (three) times daily as needed for itching.   DULoxetine 20 MG capsule Commonly known as: CYMBALTA TAKE 2 CAPSULES BY MOUTH EVERY DAY   estradiol 0.1 MG/GM vaginal cream Commonly known as: ESTRACE Estrogen Cream Instruction Discard applicator Apply pea sized amount to tip of finger to urethra before bed. Wash hands well after application. Use Monday, Wednesday and Friday   fenofibrate 145  MG tablet Commonly known as: TRICOR Take 1 tablet (145 mg total) by mouth daily. For cholesterol. Please call [phone] for an appointment.   FreeStyle Libre 3 Sensor Misc PLACE 1 SENSOR ON THE SKIN EVERY 14 DAYS. USE TO CHECK GLUCOSE CONTINUOUSLY   hydrOXYzine 50 MG capsule Commonly known as: VISTARIL Take 2 capsules (100 mg total) by mouth at bedtime. TAKE 1 CAPSLE BY MOUTH AT NOON, 1 AT 6 PM, AND 1 AS NEEDED   ibuprofen 200 MG tablet Commonly known as: ADVIL Take 400 mg by mouth 2 (two) times daily.   ketoconazole 2 % shampoo Commonly known as: NIZORAL Apply 1 Application topically as directed. Wash from waist up 3 times a week for 2 months, then decrease to 1 time a month, let sit 5 minutes and rinse off   lamoTRIgine 150 MG tablet Commonly known as: LAMICTAL Take 2 tablets (300 mg total) by mouth daily at bedtime.   levalbuterol 45 MCG/ACT inhaler Commonly known as: XOPENEX HFA Inhale 2 puffs into the lungs every 4 (four) hours as needed for wheezing.   Lysine 500 MG Tabs Take 1,000 mg by mouth daily.   metFORMIN 500 MG tablet Commonly known as: GLUCOPHAGE Take 1,000 mg by mouth.   methocarbamol 500 MG tablet Commonly known as: ROBAXIN TAKE 1-2 TABLETS (500-1,000 MG TOTAL) BY MOUTH AT BEDTIME AS NEEDED FOR MUSCLE SPASMS.   metoprolol tartrate 50 MG tablet Commonly known as: LOPRESSOR Take 1 tablet (50 mg total) by mouth 2 (two) times daily.   montelukast 10 MG tablet Commonly known as: SINGULAIR TAKE 1 TABLET BY MOUTH EVERYDAY AT BEDTIME   mupirocin ointment 2 % Commonly known as: BACTROBAN Apply 1 Application topically daily. Qd to wound on finger   neomycin-bacitracin-polymyxin [phone] ointment Apply 1 application topically 4 (four) times daily as needed (for cut/scrapes.).   NEOMYCIN-POLYMYXIN-HYDROCORTISONE 1 % Soln OTIC solution Commonly known as: CORTISPORIN Apply 1-2 drops to toe BID after soaking   Opzelura 1.5 % Crea Generic drug:  Ruxolitinib Phosphate Apply to aa's rash QD PRN.   pantoprazole 40 MG tablet Commonly known as: PROTONIX TAKE 1 TABLET (40 MG TOTAL) BY MOUTH 2 (TWO) TIMES DAILY BEFORE A MEAL. FOR HEARTBURN   polyvinyl alcohol 1.4 % ophthalmic solution Commonly known as: LIQUIFILM TEARS Place 2 drops into both eyes at bedtime.   Repatha SureClick 140 MG/ML Soaj Generic drug: Evolocumab Inject 140 mg into  the skin every 14 (fourteen) days.   Stiolto Respimat 2.5-2.5 MCG/ACT Aers Generic drug: Tiotropium Bromide-Olodaterol Inhale 2 puffs into the lungs at bedtime.   temazepam 30 MG capsule Commonly known as: RESTORIL TAKE ONE TO TWO CAPSULES BY MOUTH NIGHTLY AT BEDTIME   traZODone 50 MG tablet Commonly known as: DESYREL Take 2 tablets (100 mg total) by mouth at bedtime.   Trulicity 3 MG/0.5ML Sopn Generic drug: Dulaglutide Inject 3 mg as directed once a week.   valACYclovir 1000 MG tablet Commonly known as: VALTREX Take 2 tablets by mouth twice daily for 1 day as needed for herpes outbreak.   vitamin D3 25 MCG tablet Commonly known as: CHOLECALCIFEROL Take 3,000 Units by mouth daily.        Allergies:  Allergies  Allergen Reactions   Albuterol Shortness Of Breath and Other (See Comments)    Makes pt feel jittery/ tacycardic   Crestor [Rosuvastatin] Other (See Comments)    Joint pain, muscle pain, and hair loss   Halcion [Triazolam] Other (See Comments)    Dizziness,headaches,bladder problems   Levaquin [Levofloxacin In D5w] Diarrhea and Itching    Shoulder pain   Metformin And Related Rash   Naproxen Sodium     Patient tolerates in small doses. Her reaction is swelling of the lower extremities.    Ozempic (0.25 Or 0.5 Mg-Dose) [Semaglutide(0.25 Or 0.5mg -Dos)] Itching   Cefaclor Other (See Comments)    Doesn't remember---unsure if actually allergic    Sulfa Antibiotics Itching    Unsure of reaction possibly itching   Tramadol Itching and Nausea And Vomiting   Zinc Other  (See Comments)    constipation  constipation    Aripiprazole Other (See Comments)    Muscle tension/cramping   Diclofenac Sodium Rash    "made very sick"    Family History: Family History  Problem Relation Age of Onset   Cancer Mother    Heart disease Mother    Diabetes Mother    Sleep apnea Mother    Cancer Father        Abdomen with mastasis   High Cholesterol Father    Kidney disease Neg Hx    Bladder Cancer Neg Hx    Prostate cancer Neg Hx    Kidney cancer Neg Hx     Social History:  reports that she quit smoking about 5 years ago. Her smoking use included cigarettes. She has a 25.00 pack-year smoking history. She has been exposed to tobacco smoke. She has never used smokeless tobacco. She reports current drug use. Drugs: Methylphenidate and Marijuana. She reports that she does not drink alcohol.  ROS: Pertinent ROS in HPI  Physical Exam: BP 120/77   Pulse 94   Ht 5' 8.5" (1.74 m)   Wt 244 lb (110.7 kg)   LMP 08/23/2014   BMI 36.56 kg/m   Constitutional:  Well nourished. Alert and oriented, No acute distress. HEENT: [place] AT, moist mucus membranes.  Trachea midline Cardiovascular: No clubbing, cyanosis, or edema. Respiratory: Normal respiratory effort, no increased work of breathing. Neurologic: Grossly intact, no focal deficits, moving all 4 extremities. Psychiatric: Normal mood and affect.    Laboratory Data: Urinalysis See EPIC and HPI I have reviewed the labs.   Pertinent Imaging: Narrative & Impression  CLINICAL DATA:  Recurrent urinary tract infection   EXAM: RENAL / URINARY TRACT ULTRASOUND COMPLETE   COMPARISON:  CT abdomen pelvis 03/23/2022   FINDINGS: Right Kidney:   Renal measurements: 11.8 x 5.0 x 6.2 cm =  volume: 192 mL. Echogenicity within normal limits. No mass or hydronephrosis visualized.   Left Kidney:   Renal measurements: 11.3 x 5.3 x 5.8 cm = volume: 179 mL. Echogenicity within normal limits. No mass or  hydronephrosis visualized.   Bladder:   Appears normal for degree of bladder distention.   Other:   None.   IMPRESSION: No hydronephrosis.     Electronically Signed   By: Annia Belt M.D.   On: 03/31/2023 15:07  I have independently reviewed the films.  See HPI.    Assessment & Plan:    1. rUTI's -no symptoms at this time  2. Gross hematuria -Had a workup in 2023 was negative for malignancies -RUS (2024) - NED -no gross heme -repeat UA negative for micro heme -recent episode likely due to UTI   3. OAB -felt that the PTNS was effective, but she had to stop the treatments midway because she needed to take care an ill family member  Return for return for PTNS .   These notes generated with voice recognition software. I apologize for typographical errors.  Cloretta Ned  [facility] [address]  [address] [place], [place] [address] [phone]

## 2023-04-20 NOTE — Progress Notes (Signed)
Subjective:  Patient ID: Emily Moon, female    DOB: 1961/08/12,  MRN: [id number]  Chief Complaint  Patient presents with   Nail Problem    62 y.o. female presents with the above complaint.  Patient presents with some pain to the right fourth toe.  She states she had an ingrown removed by Dr. Al Corpus.  There is some redness associated with it.  She has not taken antibiotic she wanted get it evaluated.  She denies any other acute complaints.  She is a diabetic   Review of Systems: Negative except as noted in the HPI. Denies N/V/F/Ch.  Past Medical History:  Diagnosis Date   Acute pyelonephritis 12/25/2021   Arthritis    osteo   Asthma    Bipolar disorder (HCC) 05/21/2014   Cataracts, bilateral    Cervical radiculitis    Chest pain    Chronic pain syndrome    COPD (chronic obstructive pulmonary disease) (HCC)    DDD (degenerative disc disease), cervical    also back   Depression    Diabetes mellitus without complication (HCC)    Dizziness    Positional   Edema    feet/legs   Epigastric pain    Fibromyalgia syndrome    Fungal infection    Finger nails   GERD (gastroesophageal reflux disease)    Gout    Heart palpitations    Hip dysplasia, 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   History of kidney stones    Hx of sepsis    Hypercholesterolemia    IDA (iron deficiency anemia)    Multiple sclerosis (HCC)    weakness   Osteoporosis    osteoarthritis   Pneumonia    PONV (postoperative nausea and vomiting)    no problem after cataract surgery   Prediabetes 07/22/2018   Psoriasis    Sciatica    Seasonal allergies    Shortness of breath dyspnea    wheezing   Sleep apnea 2012   sleep study / slight, no interventions   Urinary frequency    Vitamin B 12 deficiency    Weight gain 06/21/2014    Current Outpatient Medications:    amoxicillin-clavulanate (AUGMENTIN) 875-125 MG tablet, Take 1 tablet by mouth 2 (two) times daily., Disp: 20 tablet, Rfl: 0   aspirin EC 81 MG tablet,  Take 81 mg by mouth at bedtime. Swallow whole., Disp: , Rfl:    Azelastine HCl 137 MCG/SPRAY SOLN, PLACE 2 SPRAYS INTO BOTH NOSTRILS 2 (TWO) TIMES DAILY, Disp: 30 mL, Rfl: 11   B Complex-Biotin-FA (BIG 100, BIOTIN, PO), Take 300 mg by mouth at bedtime., Disp: , Rfl:    bisacodyl (DULCOLAX) 5 MG EC tablet, Take 5 mg by mouth daily as needed for moderate constipation., Disp: , Rfl:    cetirizine (ZYRTEC) 10 MG tablet, Take 10 mg by mouth daily., Disp: , Rfl:    clindamycin (CLEOCIN T) 1 % external solution, Apply to scalp once or twice a day prn bumps, Disp: 30 mL, Rfl: 2   Continuous Glucose Sensor (FREESTYLE LIBRE 3 SENSOR) MISC, PLACE 1 SENSOR ON THE SKIN EVERY 14 DAYS. USE TO CHECK GLUCOSE CONTINUOUSLY, Disp: 6 each, Rfl: 1   cyanocobalamin (VITAMIN B12) 1000 MCG/ML injection, INJECT 1 ML (1,000 MCG TOTAL) INTO THE MUSCLE EVERY 30 (THIRTY) DAYS. FOR B12 VITAMIN, Disp: 3 mL, Rfl: 0   desipramine (NORPRAMIN) 25 MG tablet, 2  qam, Disp: 60 tablet, Rfl: 6   diphenhydrAMINE (BENADRYL) 2 % cream, Apply topically 3 (three) times daily as  needed for itching., Disp: , Rfl:    Dulaglutide (TRULICITY) 3 MG/0.5ML SOPN, Inject 3 mg into the skin once a week., Disp: 2 mL, Rfl: 1   DULoxetine (CYMBALTA) 20 MG capsule, TAKE 2 CAPSULES BY MOUTH EVERY DAY, Disp: 180 capsule, Rfl: 2   estradiol (ESTRACE) 0.1 MG/GM vaginal cream, Estrogen Cream Instruction Discard applicator Apply pea sized amount to tip of finger to urethra before bed. Wash hands well after application. Use Monday, Wednesday and Friday, Disp: 42.5 g, Rfl: 11   Evolocumab (REPATHA SURECLICK) 140 MG/ML SOAJ, Inject 140 mg into the skin every 14 (fourteen) days., Disp: 2 mL, Rfl: 5   fenofibrate (TRICOR) 145 MG tablet, Take 1 tablet (145 mg total) by mouth daily. For cholesterol. Please call [phone] for an appointment., Disp: 90 tablet, Rfl: 3   hydrOXYzine (VISTARIL) 50 MG capsule, Take 2 capsules (100 mg total) by mouth at bedtime. TAKE 1 CAPSLE  BY MOUTH AT NOON, 1 AT 6 PM, AND 1 AS NEEDED, Disp: 90 capsule, Rfl: 3   ibuprofen (ADVIL) 200 MG tablet, Take 400 mg by mouth 2 (two) times daily., Disp: , Rfl:    ketoconazole (NIZORAL) 2 % shampoo, Apply 1 Application topically as directed. Wash from waist up 3 times a week for 2 months, then decrease to 1 time a month, let sit 5 minutes and rinse off, Disp: 120 mL, Rfl: 6   lamoTRIgine (LAMICTAL) 150 MG tablet, Take 2 tablets (300 mg total) by mouth daily at bedtime., Disp: 60 tablet, Rfl: 6   levalbuterol (XOPENEX HFA) 45 MCG/ACT inhaler, Inhale 2 puffs into the lungs every 4 (four) hours as needed for wheezing., Disp: , Rfl:    Lysine 500 MG TABS, Take 1,000 mg by mouth daily., Disp: , Rfl:    metFORMIN (GLUCOPHAGE) 500 MG tablet, Take 1,000 mg by mouth., Disp: , Rfl:    methocarbamol (ROBAXIN) 500 MG tablet, TAKE 1-2 TABLETS (500-1,000 MG TOTAL) BY MOUTH AT BEDTIME AS NEEDED FOR MUSCLE SPASMS., Disp: 180 tablet, Rfl: 0   metoprolol tartrate (LOPRESSOR) 50 MG tablet, Take 1 tablet (50 mg total) by mouth 2 (two) times daily., Disp: 180 tablet, Rfl: 3   montelukast (SINGULAIR) 10 MG tablet, TAKE 1 TABLET BY MOUTH EVERYDAY AT BEDTIME, Disp: 90 tablet, Rfl: 1   mupirocin ointment (BACTROBAN) 2 %, Apply 1 Application topically daily. Qd to wound on finger, Disp: 22 g, Rfl: 1   neomycin-bacitracin-polymyxin (NEOSPORIN) [phone] ointment, Apply 1 application topically 4 (four) times daily as needed (for cut/scrapes.)., Disp: , Rfl:    NEOMYCIN-POLYMYXIN-HYDROCORTISONE (CORTISPORIN) 1 % SOLN OTIC solution, Apply 1-2 drops to toe BID after soaking, Disp: 10 mL, Rfl: 1   pantoprazole (PROTONIX) 40 MG tablet, TAKE 1 TABLET (40 MG TOTAL) BY MOUTH 2 (TWO) TIMES DAILY BEFORE A MEAL. FOR HEARTBURN, Disp: 180 tablet, Rfl: 3   polyvinyl alcohol (LIQUIFILM TEARS) 1.4 % ophthalmic solution, Place 2 drops into both eyes at bedtime., Disp: , Rfl:    Ruxolitinib Phosphate (OPZELURA) 1.5 % CREA, Apply to aa's rash  QD PRN., Disp: 60 g, Rfl: 3   STIOLTO RESPIMAT 2.5-2.5 MCG/ACT AERS, Inhale 2 puffs into the lungs at bedtime., Disp: , Rfl:    temazepam (RESTORIL) 30 MG capsule, TAKE ONE TO TWO CAPSULES BY MOUTH NIGHTLY AT BEDTIME, Disp: 60 capsule, Rfl: 5   traZODone (DESYREL) 50 MG tablet, Take 2 tablets (100 mg total) by mouth at bedtime., Disp: 60 tablet, Rfl: 7   valACYclovir (VALTREX) 1000 MG tablet,  Take 2 tablets by mouth twice daily for 1 day as needed for herpes outbreak., Disp: 12 tablet, Rfl: 0   Vitamin D3 (VITAMIN D) 25 MCG tablet, Take 3,000 Units by mouth daily., Disp: , Rfl:   Social History   Tobacco Use  Smoking Status Former   Packs/day: 1.00   Years: 25.00   Additional pack years: 0.00   Total pack years: 25.00   Types: Cigarettes   Quit date: 2019   Years since quitting: 5.4   Passive exposure: Past  Smokeless Tobacco Never  Tobacco Comments   occasional use    Allergies  Allergen Reactions   Albuterol Shortness Of Breath and Other (See Comments)    Makes pt feel jittery/ tacycardic   Crestor [Rosuvastatin] Other (See Comments)    Joint pain, muscle pain, and hair loss   Halcion [Triazolam] Other (See Comments)    Dizziness,headaches,bladder problems   Levaquin [Levofloxacin In D5w] Diarrhea and Itching    Shoulder pain   Metformin And Related Rash   Naproxen Sodium     Patient tolerates in small doses. Her reaction is swelling of the lower extremities.    Ozempic (0.25 Or 0.5 Mg-Dose) [Semaglutide(0.25 Or 0.5mg -Dos)] Itching   Cefaclor Other (See Comments)    Doesn't remember---unsure if actually allergic    Sulfa Antibiotics Itching    Unsure of reaction possibly itching   Tramadol Itching and Nausea And Vomiting   Zinc Other (See Comments)    constipation  constipation    Aripiprazole Other (See Comments)    Muscle tension/cramping   Diclofenac Sodium Rash    "made very sick"   Objective:  There were no vitals filed for this visit. There is no height or  weight on file to calculate BMI. Constitutional Well developed. Well nourished.  Vascular Dorsalis pedis pulses palpable bilaterally. Posterior tibial pulses palpable bilaterally. Capillary refill normal to all digits.  No cyanosis or clubbing noted. Pedal hair growth normal.  Neurologic Normal speech. Oriented to person, place, and time. Epicritic sensation to light touch grossly present bilaterally.  Dermatologic Nails well groomed and normal in appearance. No open wounds. No skin lesions.  Orthopedic: Mild paronychia noted to the right fourth digit.  Some macerated skin tissue noted.  Some skin epidermal lysis noted.  No cellulitis noted.  No malodor present no purulent drainage noted   Radiographs: None Assessment:   1. Paronychia of toe of right foot due to ingrown toenail   2. Diabetes mellitus without complication (HCC)    Plan:  Patient was evaluated and treated and all questions answered.  Right fourth digit paronychia -All questions and concerns were discussed with the patient in extensive detail -Given the amount of redness is present she will benefit from Augmentin -She has multiple allergies therefore Augmentin is the best course of antibiotics that she is able to tolerate -Continue Betadine wet-to-dry dressing to dry out the toe.  No follow-ups on file.   Paronychia right fourth toe Augmentin Betadine wet-to-dry dressing

## 2023-04-21 ENCOUNTER — Ambulatory Visit: Payer: PPO | Admitting: Urology

## 2023-04-21 ENCOUNTER — Encounter: Payer: Self-pay | Admitting: Urology

## 2023-04-21 VITALS — BP 120/77 | HR 94 | Ht 68.5 in | Wt 244.0 lb

## 2023-04-21 DIAGNOSIS — N3281 Overactive bladder: Secondary | ICD-10-CM

## 2023-04-21 DIAGNOSIS — R310 Gross hematuria: Secondary | ICD-10-CM | POA: Diagnosis not present

## 2023-04-21 DIAGNOSIS — R200 Anesthesia of skin: Secondary | ICD-10-CM | POA: Diagnosis not present

## 2023-04-21 DIAGNOSIS — N390 Urinary tract infection, site not specified: Secondary | ICD-10-CM | POA: Diagnosis not present

## 2023-04-21 DIAGNOSIS — Z87440 Personal history of urinary (tract) infections: Secondary | ICD-10-CM

## 2023-04-21 LAB — BLADDER SCAN AMB NON-IMAGING: Scan Result: 46

## 2023-04-22 LAB — URINALYSIS, COMPLETE
Bilirubin, UA: NEGATIVE
Glucose, UA: NEGATIVE
Ketones, UA: NEGATIVE
Nitrite, UA: NEGATIVE
Protein,UA: NEGATIVE
RBC, UA: NEGATIVE
Specific Gravity, UA: 1.025 (ref 1.005–1.030)
Urobilinogen, Ur: 0.2 mg/dL (ref 0.2–1.0)
pH, UA: 5.5 (ref 5.0–7.5)

## 2023-04-22 LAB — MICROSCOPIC EXAMINATION

## 2023-04-23 ENCOUNTER — Telehealth: Payer: Self-pay | Admitting: Pharmacist

## 2023-04-23 DIAGNOSIS — E1165 Type 2 diabetes mellitus with hyperglycemia: Secondary | ICD-10-CM

## 2023-04-23 MED ORDER — TRULICITY 3 MG/0.5ML ~~LOC~~ SOAJ
3.0000 mg | SUBCUTANEOUS | 1 refills | Status: DC
Start: 2023-04-23 — End: 2023-04-23

## 2023-04-23 MED ORDER — TRULICITY 3 MG/0.5ML ~~LOC~~ SOAJ
3.0000 mg | SUBCUTANEOUS | 1 refills | Status: DC
Start: 2023-04-23 — End: 2023-11-11

## 2023-04-23 NOTE — Telephone Encounter (Signed)
-----   Message from Karie Schwalbe, MD sent at 04/15/2023  4:23 PM EDT ----- Mardella Layman, Can we get her the 3mg  dose of her trulicity? A1c okay but not able to lose weight Rich

## 2023-04-23 NOTE — Telephone Encounter (Signed)
Patient was using Trulicity in 2023, she had supply from [facility] which has not been renewed in 2024 when pt was off Trulicity for a time.  I sent Rx to [facility] to check copay. If not affordable, we can try to re-apply to [facility] but there is no guarantee she would be approved for PAP. She would technically a re-enrollment since was enrolled last year.

## 2023-04-27 ENCOUNTER — Encounter (INDEPENDENT_AMBULATORY_CARE_PROVIDER_SITE_OTHER): Payer: Self-pay | Admitting: Family Medicine

## 2023-04-27 ENCOUNTER — Ambulatory Visit (INDEPENDENT_AMBULATORY_CARE_PROVIDER_SITE_OTHER): Payer: PPO | Admitting: Family Medicine

## 2023-04-27 VITALS — BP 103/65 | HR 89 | Temp 98.1°F | Ht 68.5 in | Wt 238.0 lb

## 2023-04-27 DIAGNOSIS — Z6835 Body mass index (BMI) 35.0-35.9, adult: Secondary | ICD-10-CM | POA: Diagnosis not present

## 2023-04-27 DIAGNOSIS — G894 Chronic pain syndrome: Secondary | ICD-10-CM

## 2023-04-27 DIAGNOSIS — Z7984 Long term (current) use of oral hypoglycemic drugs: Secondary | ICD-10-CM

## 2023-04-27 DIAGNOSIS — E639 Nutritional deficiency, unspecified: Secondary | ICD-10-CM | POA: Diagnosis not present

## 2023-04-27 DIAGNOSIS — F3170 Bipolar disorder, currently in remission, most recent episode unspecified: Secondary | ICD-10-CM | POA: Diagnosis not present

## 2023-04-27 DIAGNOSIS — E1165 Type 2 diabetes mellitus with hyperglycemia: Secondary | ICD-10-CM

## 2023-04-27 NOTE — Assessment & Plan Note (Signed)
Lab Results  Component Value Date   HGBA1C 6.6 (H) 03/23/2023   She has had less hypoglycemic events since stopping glipizide She is taking metformin 1,000 mg  daily and Trulicity 3 mg weekly She has a CGM to monitor her glucose readings She refused her prescribed meal plan, adopting OMAD on her own.  We discussed the risk for eating one meal a day including the tendency to binge eat during small eating window, poor food choices, hunger and the risk for hypoglycemia during fasting window.  She would like to continue her current IF plan as long as she is not losing muscle mass and is not having low blood sugars.   Continue current meds and we will continue to monitor for any  problems. Plan to start PT to ramp up physical activity.

## 2023-04-27 NOTE — Assessment & Plan Note (Signed)
Pt reports that her bipolar depression has been a factor in her obesity for a long time and she does lack a good support system (has one friend and a sister that check on her).  She reports compliance taking her meds.

## 2023-04-27 NOTE — Progress Notes (Unsigned)
Office: [phone]  /  Fax: [phone]  WEIGHT SUMMARY AND BIOMETRICS  Starting Date: 03/23/23  Starting Weight: 247lb   Weight Lost Since Last Visit: 9lb   Vitals Temp: 98.1 F (36.7 C) BP: 103/65 Pulse Rate: 89 SpO2: 97 %   Body Composition  Body Fat %: 44.7 % Fat Mass (lbs): 106.6 lbs Muscle Mass (lbs): 125.2 lbs Total Body Water (lbs): 82.2 lbs Visceral Fat Rating : 13     HPI  Chief Complaint: OBESITY  Emily Moon is here to discuss her progress with her obesity treatment plan. She is on the Category 2 plan and states she is following her eating plan approximately 85 % of the time. She states she is exercising 0 minutes 0 times per week.   Interval History:  Since last office visit she is down 5 lb in the past 3 weeks She is not on the meal plan She admits to 'eating garbage' but is eating only one meal per day She is tracking her calories and getting in ~2,000 cal / day Her eating window is 5 pm - 6:30 pm and most of her dinners are eaten out She started intermittent fasting on her own She has not recorded readings under 70 Some days, she has a protein shakes some morning and some days are drinking juice  She is maintaining her lean muscle mass and is down 8.8 lb of body fat She is trying to drink more water Unable to do exercise due to poor exercise tolerance-- awaiting start of PT She is going to [place] in late June to see her son and grandchildren Feeling more depressed, has one friend for support with bipolar disorder   Pharmacotherapy: Trulicity 3 mg weekly  PHYSICAL EXAM:  Blood pressure 103/65, pulse 89, temperature 98.1 F (36.7 C), height 5' 8.5" (1.74 m), weight 238 lb (108 kg), last menstrual period 08/23/2014, SpO2 97 %. Body mass index is 35.66 kg/m.  General: She is overweight, cooperative, alert, well developed, and in no acute distress. PSYCH: Has normal mood, affect and thought process.   Lungs: Normal breathing effort, no  conversational dyspnea.   ASSESSMENT AND PLAN  TREATMENT PLAN FOR OBESITY:  Recommended Dietary Goals  Emily Moon is currently in the action stage of change. As such, her goal is to continue weight management plan. She has agreed to {MWMwtlossportion/plan2:23431}.  Behavioral Intervention  We discussed the following Behavioral Modification Strategies today: {EMWMwtlossstrategies:28914::"increasing lean protein intake","decreasing simple carbohydrates ","increasing vegetables","increasing lower glycemic fruits","increasing water intake","continue to practice mindfulness when eating","planning for success"}.  Additional resources provided today: NA  Recommended Physical Activity Goals  Emily Moon has been advised to work up to 150 minutes of moderate intensity aerobic activity a week and strengthening exercises 2-3 times per week for cardiovascular health, weight loss maintenance and preservation of muscle mass.   She has agreed to {EMEXERCISE:28847::"Think about ways to increase daily physical activity and overcoming barriers to exercise"}  Pharmacotherapy changes for the treatment of obesity:   ASSOCIATED CONDITIONS ADDRESSED TODAY  There are no diagnoses linked to this encounter.    She was informed of the importance of frequent follow up visits to maximize her success with intensive lifestyle modifications for her multiple health conditions.   ATTESTASTION STATEMENTS:  Reviewed by clinician on day of visit: allergies, medications, problem list, medical history, surgical history, family history, social history, and previous encounter notes pertinent to obesity diagnosis.   I have personally spent 30 minutes total time today in preparation, patient care, nutritional counseling  and documentation for this visit, including the following: review of clinical lab tests; review of medical tests/procedures/services.      Glennis Brink, DO DABFM, DABOM [facility] [address] [place], [place] [address] [phone]

## 2023-04-28 ENCOUNTER — Telehealth: Payer: Self-pay

## 2023-04-28 ENCOUNTER — Ambulatory Visit: Payer: PPO | Admitting: Podiatry

## 2023-04-28 DIAGNOSIS — N3281 Overactive bladder: Secondary | ICD-10-CM | POA: Insufficient documentation

## 2023-04-28 NOTE — Telephone Encounter (Signed)
Faxed completed application to [employer]. eRx has been sent to [facility] pharmacy already. Will await approval decision from [employer].

## 2023-04-28 NOTE — Telephone Encounter (Signed)
Harle Battiest, PA-C  Kyan Yurkovich, Titus Mould, CMA We check with her insurance regarding PTNS.  She had 8 treatments back in 2018.  Would she be able to restart the 12 weekly treatments?  Called [employer] insurance at [phone] and spoke with Lupe and was advised no PA is needed and appointments would be covered with a co pay of $20. Reference number [phone]. Patient advised and appointments scheduled.

## 2023-04-28 NOTE — Assessment & Plan Note (Signed)
Pt is quite resistant to changing her diet She started intermittent fasting on her own and does not appear to be binge eating with only a 90 min eating window Upon further questioning, there are few days that she is actually doing OMAD, drinking a protein shake in the morning and some juice for 'low blood sugars'.  We discussed listening to hunger and full cues, making sensible choices when eating meals - balancing protein/ starch/ veggies and not allowing high sugar items.  She is also eating most of her meals out due to chronic pain and mood.  Pt is not eating on her prescribed meal plan. She is seeing weight reduction and maintaining her muscle mass.  We will follow her progress.  If her muscle mass drops, she has hypoglycemia, binge eating behaviors, will defer her care to RD.

## 2023-04-28 NOTE — Progress Notes (Signed)
Care Management & Coordination Services Pharmacy Team  Reason for Encounter: Patient Assistance forms  Trulicity   Reviewed application process for [employer] patient assistance program. Patient meets income/out of pocket spend criteria for the program.  Patient assistance application forms have been downloaded on behalf of the patient. Forms have been sent to clinical pharmacist for review to determine next steps. This is Renewal for 2024  Al Corpus, PharmD notified  Burt Knack, [facility] Clinical Pharmacy Assistant [phone]

## 2023-04-28 NOTE — Assessment & Plan Note (Signed)
Chronic pain has been a barrier to her physical activity and this has effected her weight. She is managed on desipramine 25 mg at night, Cymbalta 40 mg daily, Robaxin 500 mg 1-2 tabs at bedtime prn.  We discussed adding in chair yoga exercises Check these out on YouTube and aim for 3 days/ wk

## 2023-05-06 ENCOUNTER — Ambulatory Visit: Payer: PPO | Admitting: Dermatology

## 2023-05-11 ENCOUNTER — Encounter: Payer: Self-pay | Admitting: Dermatology

## 2023-05-11 ENCOUNTER — Ambulatory Visit: Payer: PPO | Admitting: Dermatology

## 2023-05-11 DIAGNOSIS — Z79899 Other long term (current) drug therapy: Secondary | ICD-10-CM | POA: Diagnosis not present

## 2023-05-11 DIAGNOSIS — X32XXXA Exposure to sunlight, initial encounter: Secondary | ICD-10-CM

## 2023-05-11 DIAGNOSIS — L568 Other specified acute skin changes due to ultraviolet radiation: Secondary | ICD-10-CM | POA: Diagnosis not present

## 2023-05-11 DIAGNOSIS — L559 Sunburn, unspecified: Secondary | ICD-10-CM

## 2023-05-11 DIAGNOSIS — R21 Rash and other nonspecific skin eruption: Secondary | ICD-10-CM

## 2023-05-11 DIAGNOSIS — Z7189 Other specified counseling: Secondary | ICD-10-CM

## 2023-05-11 MED ORDER — PREDNISONE 5 MG PO TABS: 5.0000 mg | ORAL_TABLET | Freq: Every day | ORAL | 0 refills | Status: DC

## 2023-05-11 NOTE — Patient Instructions (Addendum)
Risks of prednisone taper discussed including mood irritability, insomnia, weight gain, stomach ulcers, increased risk of infection, increased blood sugar (diabetes), hypertension, osteoporosis with long-term or frequent use, and rare risk of avascular necrosis of the hip.    2 Week Prednisone Taper  You will be given a prescription for 100 tablets of oral Prednisone. It is very important that you take this according to the exact schedule provided below. This type of regimen for taking medication is often called a "taper", because your dosage will steadily decrease over a two week period until it is discontinued altogether.  ALWAYS take this medicine with food to prevent it from irritating your stomach. You should also take your Prednisone during morning hours.  Call the clinic at [phone] if you gain more than two pounds in one day, notice swelling anywhere on your body, have shortness of breath, black or red bowel movements, brown or red vomitus, desire to drink large amounts of fluids, a fever, or extreme weakness.   Oral Prednisone over Two Weeks  Day  Week 1  Week 2   1  12  tablets  7 tablets   2  12 tablets  6 tablets   3  11 tablets  5 tablets   4  10 tablets  4 tablets   5  10 tablets  3 tablets   6  9 tablets  2 tablets   7  8 tablets  1 tablet     Due to recent changes in healthcare laws, you may see results of your pathology and/or laboratory studies on MyChart before the doctors have had a chance to review them. We understand that in some cases there may be results that are confusing or concerning to you. Please understand that not all results are received at the same time and often the doctors may need to interpret multiple results in order to provide you with the best plan of care or course of treatment. Therefore, we ask that you please give [place] 2 business days to thoroughly review all your results before contacting the office for clarification. Should we see a critical lab  result, you will be contacted sooner.   If You Need Anything After Your Visit  If you have any questions or concerns for your doctor, please call our main line at [phone] and press option 4 to reach your doctor's medical assistant. If no one answers, please leave a voicemail as directed and we will return your call as soon as possible. Messages left after 4 pm will be answered the following business day.   You may also send [place] a message via MyChart. We typically respond to MyChart messages within 1-2 business days.  For prescription refills, please ask your pharmacy to contact our office. Our fax number is [phone].  If you have an urgent issue when the clinic is closed that cannot wait until the next business day, you can page your doctor at the number below.    Please note that while we do our best to be available for urgent issues outside of office hours, we are not available 24/7.   If you have an urgent issue and are unable to reach [place], you may choose to seek medical care at your doctor's office, retail clinic, urgent care center, or emergency room.  If you have a medical emergency, please immediately call 911 or go to the emergency department.  Pager Numbers  - Dr. Gwen Pounds: [phone]  - Dr. Neale Burly: [phone]  - Dr. Roseanne Reno:  [phone]  In the event of inclement weather, please call our main line at [phone] for an update on the status of any delays or closures.  Dermatology Medication Tips: Please keep the boxes that topical medications come in in order to help keep track of the instructions about where and how to use these. Pharmacies typically print the medication instructions only on the boxes and not directly on the medication tubes.   If your medication is too expensive, please contact our office at [phone] option 4 or send [place] a message through MyChart.   We are unable to tell what your co-pay for medications will be in advance as this is  different depending on your insurance coverage. However, we may be able to find a substitute medication at lower cost or fill out paperwork to get insurance to cover a needed medication.   If a prior authorization is required to get your medication covered by your insurance company, please allow [place] 1-2 business days to complete this process.  Drug prices often vary depending on where the prescription is filled and some pharmacies may offer cheaper prices.  The website www.goodrx.com contains coupons for medications through different pharmacies. The prices here do not account for what the cost may be with help from insurance (it may be cheaper with your insurance), but the website can give you the price if you did not use any insurance.  - You can print the associated coupon and take it with your prescription to the pharmacy.  - You may also stop by our office during regular business hours and pick up a GoodRx coupon card.  - If you need your prescription sent electronically to a different pharmacy, notify our office through [facility] or by phone at [phone] option 4.     Si Usted Necesita Algo Despus de Su Visita  Tambin puede enviarnos un mensaje a travs de Clinical cytogeneticist. Por lo general respondemos a los mensajes de MyChart en el transcurso de 1 a 2 das hbiles.  Para renovar recetas, por favor pida a su farmacia que se ponga en contacto con nuestra oficina. Annie Sable de fax es [place] [phone].  Si tiene un asunto urgente cuando la clnica est cerrada y que no puede esperar hasta el siguiente da hbil, puede llamar/localizar a su doctor(a) al nmero que aparece a continuacin.   Por favor, tenga en cuenta que aunque hacemos todo lo posible para estar disponibles para asuntos urgentes fuera del horario de [place], no estamos disponibles las 24 horas del da, los 7 [address]  [address] de la [place].   Si tiene un problema urgente y no puede comunicarse con nosotros, puede optar por buscar  atencin mdica  en el consultorio de su doctor(a), en una clnica privada, en un centro de atencin urgente o en una sala de emergencias.  Si tiene Engineer, drilling, por favor llame inmediatamente al 911 o vaya a la sala de emergencias.  Nmeros de bper  - Dr. Gwen Pounds: [phone]  - Dra. Moye: [phone]  - Dra. Roseanne Reno: [phone]  En caso de inclemencias del [place], por favor llame a Lacy Duverney principal al [phone] para una actualizacin sobre el [place] de cualquier retraso o cierre.  Consejos para la medicacin en dermatologa: Por favor, guarde las cajas en las que vienen los medicamentos de uso tpico para ayudarle a seguir las instrucciones sobre dnde y cmo usarlos. Las farmacias generalmente imprimen las instrucciones del medicamento slo en las cajas y no directamente en los tubos del [place].  Si su medicamento es muy caro, por favor, pngase en contacto con Rolm Gala llamando al [phone] y presione la opcin 4 o envenos un mensaje a travs de Clinical cytogeneticist.   No podemos decirle cul ser su copago por los medicamentos por adelantado ya que esto es diferente dependiendo de la cobertura de su seguro. Sin embargo, es posible que podamos encontrar un medicamento sustituto a Audiological scientist un formulario para que el seguro cubra el medicamento que se considera necesario.   Si se requiere una autorizacin previa para que su compaa de seguros [place] su medicamento, por favor permtanos de 1 a 2 das hbiles para completar [address]  Los precios de los medicamentos varan con frecuencia dependiendo del Environmental consultant de dnde se surte la receta y alguna farmacias pueden ofrecer precios ms baratos.  El sitio web www.goodrx.com tiene cupones para medicamentos de Health and safety inspector. Los precios aqu no tienen en cuenta lo que podra costar con la ayuda del seguro (puede ser ms barato con su seguro), pero el sitio web puede darle el precio si no utiliz  Tourist information centre manager.  - Puede imprimir el cupn correspondiente y llevarlo con su receta a la farmacia.  - Tambin puede pasar por nuestra oficina durante el horario de atencin regular y Education officer, museum una tarjeta de cupones de GoodRx.  - Si necesita que su receta se enve electrnicamente a una farmacia diferente, informe a nuestra oficina a travs de MyChart de [place] o por telfono llamando al [phone] y presione la opcin 4.

## 2023-05-11 NOTE — Progress Notes (Signed)
   Follow-Up Visit   Subjective  Emily Moon is a 62 y.o. female who presents for the following: patient c/o sunburn that occurred on Saturday very painful, patient is concerned because she has never had sunburn this significant. Patient was finally able to get Opzelura and she uses that PRN on rash.   The following portions of the chart were reviewed this encounter and updated as appropriate: medications, allergies, medical history  Review of Systems:  No other skin or systemic complaints except as noted in HPI or Assessment and Plan.  Objective  Well appearing patient in no apparent distress; mood and affect are within normal limits. A focused examination was performed of the following areas: the scalp, face, back, legs, arms, and feet Relevant exam findings are noted in the Assessment and Plan.   Assessment & Plan   Rash - sunburn  Exam: Erythema - severe on back arms scalp forehead legs arms and shoulders chest  Differential diagnosis:  Sunburn from being at pool with grandchildren  Treatment Plan: 2 Week Prednisone Taper You will be given a prescription for 100 tablets of oral Prednisone. It is very important that you take this according to the exact schedule provided below. This type of regimen for taking medication is often called a "taper", because your dosage will steadily decrease over a two week period until it is discontinued altogether.  ALWAYS take this medicine with food to prevent it from irritating your stomach. You should also take your Prednisone during morning hours.  Call the clinic at [phone] if you gain more than two pounds in one day, notice swelling anywhere on your body, have shortness of breath, black or red bowel movements, brown or red vomitus, desire to drink large amounts of fluids, a fever, or extreme weakness.   Oral Prednisone over Two Weeks  Day  Week 1  Week 2   1  12  tablets  7 tablets   2  12 tablets  6 tablets   3  11 tablets  5  tablets   4  10 tablets  4 tablets   5  10 tablets  3 tablets   6  9 tablets  2 tablets   7  8 tablets  1 tablet    Ok to continue Aloe gel.    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 Condition is symptomatic / bothersome to patient. Not to goal.  Treatment Plan: Cont Zyrtec qd Cont Pantoprazole qd Cont Singulair 10mg  1 po qd (no side effects ; no depression or mood changes) Continue Opzelura topically - seems to help a lot May consider Xolair in the future.  Return for appointment as scheduled.  Maylene Roes, CMA, am acting as scribe for Armida Sans, MD .  Documentation: I have reviewed the above documentation for accuracy and completeness, and I agree with the above.  Armida Sans, MD

## 2023-05-12 ENCOUNTER — Encounter: Payer: Self-pay | Admitting: Internal Medicine

## 2023-05-12 ENCOUNTER — Telehealth: Payer: Self-pay | Admitting: Internal Medicine

## 2023-05-12 NOTE — Telephone Encounter (Signed)
Pt called asking if Carollee Herter can email a copy of her insurance card to the company, "[employer]". Their fax # is [phone]. Call back # [phone]

## 2023-05-13 ENCOUNTER — Encounter: Payer: Self-pay | Admitting: Dermatology

## 2023-05-13 ENCOUNTER — Other Ambulatory Visit: Payer: Self-pay | Admitting: Primary Care

## 2023-05-13 DIAGNOSIS — K219 Gastro-esophageal reflux disease without esophagitis: Secondary | ICD-10-CM

## 2023-05-13 NOTE — Telephone Encounter (Signed)
Copy of insurance card routed by fax to [facility]

## 2023-05-14 NOTE — Telephone Encounter (Signed)
Emily Schwalbe, MD  to Emily Moon      05/12/23  4:18 PM I don't think it is a good idea to start insulin for just 2 weeks of steroids. Make sure you drink lots of water and be really careful with your eating (NO sweets and limited carbs) You can double the metformin to 1000 twice a day for now I am leaving town tomorrow--won't be back till 7/8  Last read by Emily Moon at  4:37 PM on 05/12/2023.   I spoke with pt; pt did not go to ED or UC lastnight.pt said she has increased her metformin to 1000 bid per Dr Alphonsus Sias instruction.pt said she has call in to prescriber of prednisone and pt thinks she is going to stop prednisone for a bad sunburn. Pt said now FBS 141. No symptoms.Offered pt  appt but pt said that she is OK now and will cb for appt if needed.sending note to Fulton Mole FNP as Lorain Childes.

## 2023-05-18 ENCOUNTER — Telehealth: Payer: Self-pay | Admitting: Internal Medicine

## 2023-05-18 NOTE — Telephone Encounter (Signed)
Spoke to pt because she should not need refills of Trulicity. She said she did not need a refill. She was asking if we knew of another way to get the Trulicity at a cheaper price or free. I advised if [place] is not helping her with pt assistance this time, there is not much more that can be done. She is on a Medicare plan, so she cannot use a discount card from the manufacturer. She stated if she cannot get help with it, she will not be able to take it. She will let us know.

## 2023-05-18 NOTE — Telephone Encounter (Signed)
Prescription Request  05/18/2023  LOV: 04/15/2023  What is the name of the medication or equipment? Dulaglutide (TRULICITY) 3 MG/0.5ML SOPN [[id number]]   Have you contacted your pharmacy to request a refill? No   Which pharmacy would you like this sent to?  [id number] [address] [place] [address] Phone: [phone] Fax: [phone]    Patient notified that their request is being sent to the clinical staff for review and that they should receive a response within 2 business days.   Please advise at Mobile [phone] (mobile)

## 2023-05-21 ENCOUNTER — Encounter (INDEPENDENT_AMBULATORY_CARE_PROVIDER_SITE_OTHER): Payer: Self-pay

## 2023-05-24 ENCOUNTER — Telehealth: Payer: Self-pay | Admitting: Internal Medicine

## 2023-05-24 NOTE — Telephone Encounter (Signed)
Patient contacted the office regarding care coordination, states she used to meet with previous pharmacist. Asked if she could be set up to meet with someone in the future, please advise. Thank you.

## 2023-05-25 ENCOUNTER — Telehealth: Payer: Self-pay

## 2023-05-25 ENCOUNTER — Ambulatory Visit (INDEPENDENT_AMBULATORY_CARE_PROVIDER_SITE_OTHER): Payer: PPO | Admitting: Family Medicine

## 2023-05-25 NOTE — Progress Notes (Signed)
   Care Guide Note  05/25/2023 Name: Emily Moon MRN: [id number] DOB: Jul 04, 1961  Referred by: Karie Schwalbe, MD Reason for referral : Care Coordination (Outreach to schedule with Pharm d Aug-September )   Emily Moon is a 62 y.o. year old female who is a primary care patient of Karie Schwalbe, MD. [name] was referred to the pharmacist for assistance related to DM.    An unsuccessful telephone outreach was attempted today to contact the patient who was referred to the pharmacy team for assistance with medication management. Additional attempts will be made to contact the patient.   Penne Lash, RMA Care Guide [facility]  [place], [place] [address] Direct Dial: [phone] [email or web address]

## 2023-05-28 ENCOUNTER — Ambulatory Visit: Payer: PPO | Admitting: Physician Assistant

## 2023-05-31 NOTE — Progress Notes (Signed)
   Care Guide Note  05/31/2023 Name: [name] MRN: [id number] DOB: [birth year]  Referred by: Karie Schwalbe, MD Reason for referral : Care Coordination (Outreach to schedule with Pharm d Aug-September )   Emily Moon is a 62 y.o. year old female who is a primary care patient of Karie Schwalbe, MD. [name] was referred to the pharmacist for assistance related to DM.    A second unsuccessful telephone outreach was attempted today to contact the patient who was referred to the pharmacy team for assistance with medication management. Additional attempts will be made to contact the patient.  Penne Lash, RMA Care Guide [facility]  [place], [place] [address] Direct Dial: [phone] [email or web address]

## 2023-06-01 ENCOUNTER — Ambulatory Visit (INDEPENDENT_AMBULATORY_CARE_PROVIDER_SITE_OTHER): Payer: PPO | Admitting: Family Medicine

## 2023-06-01 DIAGNOSIS — G4733 Obstructive sleep apnea (adult) (pediatric): Secondary | ICD-10-CM | POA: Diagnosis not present

## 2023-06-02 ENCOUNTER — Other Ambulatory Visit (HOSPITAL_COMMUNITY): Payer: Self-pay | Admitting: Psychiatry

## 2023-06-02 ENCOUNTER — Ambulatory Visit (HOSPITAL_COMMUNITY): Payer: PPO | Admitting: Psychiatry

## 2023-06-03 NOTE — Telephone Encounter (Signed)
.  left message to have patient return my call.  

## 2023-06-04 ENCOUNTER — Ambulatory Visit (INDEPENDENT_AMBULATORY_CARE_PROVIDER_SITE_OTHER): Payer: PPO | Admitting: Physician Assistant

## 2023-06-04 DIAGNOSIS — Z87440 Personal history of urinary (tract) infections: Secondary | ICD-10-CM

## 2023-06-04 DIAGNOSIS — M545 Low back pain, unspecified: Secondary | ICD-10-CM | POA: Diagnosis not present

## 2023-06-04 DIAGNOSIS — N390 Urinary tract infection, site not specified: Secondary | ICD-10-CM | POA: Diagnosis not present

## 2023-06-04 DIAGNOSIS — R300 Dysuria: Secondary | ICD-10-CM

## 2023-06-04 DIAGNOSIS — N3289 Other specified disorders of bladder: Secondary | ICD-10-CM

## 2023-06-04 DIAGNOSIS — N3281 Overactive bladder: Secondary | ICD-10-CM | POA: Diagnosis not present

## 2023-06-04 LAB — URINALYSIS, COMPLETE
Bilirubin, UA: NEGATIVE
Glucose, UA: NEGATIVE
Ketones, UA: NEGATIVE
Nitrite, UA: NEGATIVE
Specific Gravity, UA: >1.03 — ABNORMAL HIGH (ref 1.005–1.030)
Urobilinogen, Ur: 0.2 mg/dL (ref 0.2–1.0)
pH, UA: 5.5 (ref 5.0–7.5)

## 2023-06-04 LAB — MICROSCOPIC EXAMINATION: WBC, UA: >30 /hpf — AB (ref 0–5)

## 2023-06-04 MED ORDER — AMOXICILLIN-POT CLAVULANATE 875-125 MG PO TABS
1.0000 | ORAL_TABLET | Freq: Two times a day (BID) | ORAL | 0 refills | Status: AC
Start: 2023-06-04 — End: 2023-06-09

## 2023-06-04 NOTE — Progress Notes (Signed)
PTNS  Session # 1 (lifetime #13)  Health & Social Factors: no changes Caffeine: 1 every 4 days , no caffeine Alcohol: 0 Daytime voids #per day: 6-7 Night-time voids #per night: 12 Urgency: severe Incontinence Episodes #per day: none Ankle used: left Treatment Setting: 2 Feeling/ Response: both  Comments: feeling a tingling at the bottom of heel and toes did wiggle  Performed By: Malcolm Metro RMA  Follow Up: 1 week as scheduled

## 2023-06-04 NOTE — Patient Instructions (Signed)

## 2023-06-04 NOTE — Progress Notes (Unsigned)
06/04/2023 2:50 PM   Emily Moon 20-Jan-1961 [id number]  CC: Chief Complaint  Patient presents with   PTNS   HPI: Emily Moon is a 62 y.o. female with PMH MS, nephrolithiasis, OAB wet, GSM on topical vaginal estrogen cream, hematuria with benign workup in 2023, and rUTI who presents today for evaluation of possible UTI and to restart weekly PTNS treatments.   Today she reports 1 week of low back pain, dysuria, and bladder pressure. No fever, chills, nausea, or vomiting.  She completed 12 weekly PTNS treatments in 2018 but was unable to continue with maintenance at that time.  In-office UA today positive for 2+ blood, 2+ protein, and 1+ leukocytes; urine microscopy with >30 WBCs/HPF, 3-10 RBCs/HPF, and moderate bacteria.   PMH: Past Medical History:  Diagnosis Date   Acute pyelonephritis 12/25/2021   Arthritis    osteo   Asthma    Bipolar disorder (HCC) 05/21/2014   Cataracts, bilateral    Cervical radiculitis    Chest pain    Chronic pain syndrome    COPD (chronic obstructive pulmonary disease) (HCC)    DDD (degenerative disc disease), cervical    also back   Depression    Diabetes mellitus without complication (HCC)    Dizziness    Positional   Edema    feet/legs   Epigastric pain    Fibromyalgia syndrome    Fungal infection    Finger nails   GERD (gastroesophageal reflux disease)    Gout    Heart palpitations    Hip dysplasia, 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   History of kidney stones    Hx of sepsis    Hypercholesterolemia    IDA (iron deficiency anemia)    Multiple sclerosis (HCC)    weakness   Osteoporosis    osteoarthritis   Pneumonia    PONV (postoperative nausea and vomiting)    no problem after cataract surgery   Prediabetes 07/22/2018   Psoriasis    Sciatica    Seasonal allergies    Shortness of breath dyspnea    wheezing   Sleep apnea 2012   sleep study / slight, no interventions   Urinary frequency    Vitamin B 12 deficiency     Weight gain 06/21/2014    Surgical History: Past Surgical History:  Procedure Laterality Date   CATARACT EXTRACTION W/PHACO Left 05/21/2015   Procedure: CATARACT EXTRACTION PHACO AND INTRAOCULAR LENS PLACEMENT (IOC);  Surgeon: Galen Manila, MD;  Location: [facility];  Service: Ophthalmology;  Laterality: Left;  [place] 00:35 AP% 22.9 CDE 8.11 fluid pack lot #[id number]   CATARACT EXTRACTION W/PHACO Right 06/04/2015   Procedure: CATARACT EXTRACTION PHACO AND INTRAOCULAR LENS PLACEMENT (IOC);  Surgeon: Galen Manila, MD;  Location: [facility];  Service: Ophthalmology;  Laterality: Right;  US:00:48 AP%: 10.5 CDE:5.08 Fluid lot #[id number]   CYSTOSCOPY/URETEROSCOPY/HOLMIUM LASER/STENT PLACEMENT Bilateral 09/22/2016   Procedure: CYSTOSCOPY/URETEROSCOPY/HOLMIUM LASER/STENT PLACEMENT;  Surgeon: Vanna Scotland, MD;  Location: [facility];  Service: Urology;  Laterality: Bilateral;   EYE SURGERY  2015   tissue biopsy   FOOT SURGERY  2015   JOINT REPLACEMENT Left 2013   hip replacement   LITHOTRIPSY     PTOSIS REPAIR Bilateral 02/18/2016   Procedure: BILATERAL PTOSIS REPAIR UPPER EYELIDS;  Surgeon: Imagene Riches, MD;  Location: [facility];  Service: Ophthalmology;  Laterality: Bilateral;  LEAVE PT EARLY AM   thumb surgery Right    TONSILLECTOMY  1973   TOTAL HIP ARTHROPLASTY Right 07/02/2021   Procedure:  TOTAL HIP ARTHROPLASTY ANTERIOR APPROACH;  Surgeon: Ollen Gross, MD;  Location: [facility];  Service: Orthopedics;  Laterality: Right;    Home Medications:  Allergies as of 06/04/2023       Reactions   Albuterol Shortness Of Breath, Other (See Comments)   Makes pt feel jittery/ tacycardic   Crestor [rosuvastatin] Other (See Comments)   Joint pain, muscle pain, and hair loss   Halcion [triazolam] Other (See Comments)   Dizziness,headaches,bladder problems   Levaquin [levofloxacin In D5w] Diarrhea, Itching   Shoulder pain   Metformin And Related Rash   Naproxen Sodium    Patient tolerates in  small doses. Her reaction is swelling of the lower extremities.    Ozempic (0.25 Or 0.5 Mg-dose) [semaglutide(0.25 Or 0.5mg -dos)] Itching   Cefaclor Other (See Comments)   Doesn't remember---unsure if actually allergic    Sulfa Antibiotics Itching   Unsure of reaction possibly itching   Tramadol Itching, Nausea And Vomiting   Zinc Other (See Comments)   constipation  constipation    Aripiprazole Other (See Comments)   Muscle tension/cramping   Diclofenac Sodium Rash   "made very sick"        Medication List        Accurate as of June 04, 2023  2:50 PM. If you have any questions, ask your nurse or doctor.          amoxicillin-clavulanate 875-125 MG tablet Commonly known as: AUGMENTIN Take 1 tablet by mouth 2 (two) times daily.   aspirin EC 81 MG tablet Take 81 mg by mouth at bedtime. Swallow whole.   Azelastine HCl 137 MCG/SPRAY Soln PLACE 2 SPRAYS INTO BOTH NOSTRILS 2 (TWO) TIMES DAILY   BIG 100 (BIOTIN) PO Take 300 mg by mouth at bedtime.   bisacodyl 5 MG EC tablet Commonly known as: DULCOLAX Take 5 mg by mouth daily as needed for moderate constipation.   cetirizine 10 MG tablet Commonly known as: ZYRTEC Take 10 mg by mouth daily.   clindamycin 1 % external solution Commonly known as: CLEOCIN T Apply to scalp once or twice a day prn bumps   cyanocobalamin 1000 MCG/ML injection Commonly known as: VITAMIN B12 INJECT 1 ML (1,000 MCG TOTAL) INTO THE MUSCLE EVERY 30 (THIRTY) DAYS. FOR B12 VITAMIN   desipramine 25 MG tablet Commonly known as: NORPRAMIN 2  qam   diphenhydrAMINE 2 % cream Commonly known as: BENADRYL Apply topically 3 (three) times daily as needed for itching.   DULoxetine 20 MG capsule Commonly known as: CYMBALTA TAKE 2 CAPSULES BY MOUTH EVERY DAY   estradiol 0.1 MG/GM vaginal cream Commonly known as: ESTRACE Estrogen Cream Instruction Discard applicator Apply pea sized amount to tip of finger to urethra before bed. Wash hands well  after application. Use Monday, Wednesday and Friday   fenofibrate 145 MG tablet Commonly known as: TRICOR Take 1 tablet (145 mg total) by mouth daily. For cholesterol. Please call [phone] for an appointment.   FreeStyle Libre 3 Sensor Misc PLACE 1 SENSOR ON THE SKIN EVERY 14 DAYS. USE TO CHECK GLUCOSE CONTINUOUSLY   hydrOXYzine 50 MG capsule Commonly known as: VISTARIL Take 2 capsules (100 mg total) by mouth at bedtime. TAKE 1 CAPSLE BY MOUTH AT NOON, 1 AT 6 PM, AND 1 AS NEEDED   ibuprofen 200 MG tablet Commonly known as: ADVIL Take 400 mg by mouth 2 (two) times daily.   ketoconazole 2 % shampoo Commonly known as: NIZORAL Apply 1 Application topically as directed. Wash from waist  up 3 times a week for 2 months, then decrease to 1 time a month, let sit 5 minutes and rinse off   lamoTRIgine 150 MG tablet Commonly known as: LAMICTAL Take 2 tablets (300 mg total) by mouth daily at bedtime.   levalbuterol 45 MCG/ACT inhaler Commonly known as: XOPENEX HFA Inhale 2 puffs into the lungs every 4 (four) hours as needed for wheezing.   Lysine 500 MG Tabs Take 1,000 mg by mouth daily.   metFORMIN 500 MG tablet Commonly known as: GLUCOPHAGE Take 1,000 mg by mouth.   methocarbamol 500 MG tablet Commonly known as: ROBAXIN TAKE 1-2 TABLETS (500-1,000 MG TOTAL) BY MOUTH AT BEDTIME AS NEEDED FOR MUSCLE SPASMS.   metoprolol tartrate 50 MG tablet Commonly known as: LOPRESSOR Take 1 tablet (50 mg total) by mouth 2 (two) times daily.   montelukast 10 MG tablet Commonly known as: SINGULAIR TAKE 1 TABLET BY MOUTH EVERYDAY AT BEDTIME   mupirocin ointment 2 % Commonly known as: BACTROBAN Apply 1 Application topically daily. Qd to wound on finger   neomycin-bacitracin-polymyxin [id number] ointment Apply 1 application topically 4 (four) times daily as needed (for cut/scrapes.).   NEOMYCIN-POLYMYXIN-HYDROCORTISONE 1 % Soln OTIC solution Commonly known as: CORTISPORIN Apply 1-2 drops  to toe BID after soaking   Opzelura 1.5 % Crea Generic drug: Ruxolitinib Phosphate Apply to aa's rash QD PRN.   pantoprazole 40 MG tablet Commonly known as: PROTONIX TAKE 1 TABLET (40 MG TOTAL) BY MOUTH 2 (TWO) TIMES DAILY BEFORE A MEAL. FOR HEARTBURN   polyvinyl alcohol 1.4 % ophthalmic solution Commonly known as: LIQUIFILM TEARS Place 2 drops into both eyes at bedtime.   predniSONE 5 MG tablet Commonly known as: DELTASONE Take 1 tablet (5 mg total) by mouth daily with breakfast. Patient has written instruction sheet for two week taper.   Repatha SureClick 140 MG/ML Soaj Generic drug: Evolocumab Inject 140 mg into the skin every 14 (fourteen) days.   Stiolto Respimat 2.5-2.5 MCG/ACT Aers Generic drug: Tiotropium Bromide-Olodaterol Inhale 2 puffs into the lungs at bedtime.   temazepam 30 MG capsule Commonly known as: RESTORIL TAKE ONE TO TWO CAPSULES BY MOUTH NIGHTLY AT BEDTIME   traZODone 50 MG tablet Commonly known as: DESYREL Take 2 tablets (100 mg total) by mouth at bedtime.   Trulicity 3 MG/0.5ML Sopn Generic drug: Dulaglutide Inject 3 mg into the skin once a week.   valACYclovir 1000 MG tablet Commonly known as: VALTREX Take 2 tablets by mouth twice daily for 1 day as needed for herpes outbreak.   vitamin D3 25 MCG tablet Commonly known as: CHOLECALCIFEROL Take 3,000 Units by mouth daily.        Allergies:  Allergies  Allergen Reactions   Albuterol Shortness Of Breath and Other (See Comments)    Makes pt feel jittery/ tacycardic   Crestor [Rosuvastatin] Other (See Comments)    Joint pain, muscle pain, and hair loss   Halcion [Triazolam] Other (See Comments)    Dizziness,headaches,bladder problems   Levaquin [Levofloxacin In D5w] Diarrhea and Itching    Shoulder pain   Metformin And Related Rash   Naproxen Sodium     Patient tolerates in small doses. Her reaction is swelling of the lower extremities.    Ozempic (0.25 Or 0.5 Mg-Dose)  [Semaglutide(0.25 Or 0.5mg -Dos)] Itching   Cefaclor Other (See Comments)    Doesn't remember---unsure if actually allergic    Sulfa Antibiotics Itching    Unsure of reaction possibly itching   Tramadol Itching and Nausea  And Vomiting   Zinc Other (See Comments)    constipation  constipation    Aripiprazole Other (See Comments)    Muscle tension/cramping   Diclofenac Sodium Rash    "made very sick"    Family History: Family History  Problem Relation Age of Onset   Cancer Mother    Heart disease Mother    Diabetes Mother    Sleep apnea Mother    Cancer Father        Abdomen with mastasis   High Cholesterol Father    Kidney disease Neg Hx    Bladder Cancer Neg Hx    Prostate cancer Neg Hx    Kidney cancer Neg Hx     Social History:   reports that she quit smoking about 5 years ago. Her smoking use included cigarettes. She started smoking about 30 years ago. She has a 25 pack-year smoking history. She has been exposed to tobacco smoke. She has never used smokeless tobacco. She reports current drug use. Drugs: Methylphenidate and Marijuana. She reports that she does not drink alcohol.  Physical Exam: LMP 08/23/2014   Constitutional:  Alert and oriented, no acute distress, nontoxic appearing HEENT: [place], AT Cardiovascular: No clubbing, cyanosis, or edema Respiratory: Normal respiratory effort, no increased work of breathing Skin: No rashes, bruises or suspicious lesions Neurologic: Grossly intact, no focal deficits, moving all 4 extremities Psychiatric: Normal mood and affect  Laboratory Data: Results for orders placed or performed in visit on 06/04/23  Microscopic Examination   Urine  Result Value Ref Range   WBC, UA >30 (A) 0 - 5 /hpf   RBC, Urine 3-10 (A) 0 - 2 /hpf   Epithelial Cells (non renal) 0-10 0 - 10 /hpf   Bacteria, UA Moderate (A) None seen/Few  Urinalysis, Complete  Result Value Ref Range   Specific Gravity, UA >1.030 (H) 1.005 - 1.030   pH, UA 5.5 5.0 -  7.5   Color, UA Yellow Yellow   Appearance Ur Cloudy (A) Clear   Leukocytes,UA 1+ (A) Negative   Protein,UA 2+ (A) Negative/Trace   Glucose, UA Negative Negative   Ketones, UA Negative Negative   RBC, UA 2+ (A) Negative   Bilirubin, UA Negative Negative   Urobilinogen, Ur 0.2 0.2 - 1.0 mg/dL   Nitrite, UA Negative Negative   Microscopic Examination See below:    *Note: Due to a large number of results and/or encounters for the requested time period, some results have not been displayed. A complete set of results can be found in Results Review.   Assessment & Plan:   1. OAB (overactive bladder) Restart 12 weekly PTNS treatments today, see separate procedure note. She has already completed 12 treatments in the remote past; anticipate these will apply toward lifetime allowable per insurance. - PTNS-Percutaneous Tibial Nerve Stimulati  2. Recurrent UTI UA appears grossly infected today, will start empiric Augmentin and send for culture. She is in agreement with this plan. - Urinalysis, Complete - CULTURE, URINE COMPREHENSIVE - amoxicillin-clavulanate (AUGMENTIN) 875-125 MG tablet; Take 1 tablet by mouth 2 (two) times daily for 5 days.  Dispense: 10 tablet; Refill: 0   Return in about 1 week (around 06/11/2023) for PTNS.  Carman Ching, PA-C  [facility] [address] [place], [place] [address] [phone]

## 2023-06-05 ENCOUNTER — Other Ambulatory Visit (HOSPITAL_COMMUNITY): Payer: Self-pay | Admitting: Psychiatry

## 2023-06-05 DIAGNOSIS — F310 Bipolar disorder, current episode hypomanic: Secondary | ICD-10-CM

## 2023-06-07 ENCOUNTER — Encounter: Payer: Self-pay | Admitting: Internal Medicine

## 2023-06-09 LAB — CULTURE, URINE COMPREHENSIVE

## 2023-06-11 ENCOUNTER — Ambulatory Visit: Payer: PPO | Admitting: Physician Assistant

## 2023-06-12 ENCOUNTER — Other Ambulatory Visit: Payer: Self-pay | Admitting: Primary Care

## 2023-06-12 DIAGNOSIS — E538 Deficiency of other specified B group vitamins: Secondary | ICD-10-CM

## 2023-06-14 ENCOUNTER — Other Ambulatory Visit: Payer: Self-pay | Admitting: *Deleted

## 2023-06-14 ENCOUNTER — Telehealth: Payer: Self-pay | Admitting: Internal Medicine

## 2023-06-14 DIAGNOSIS — D509 Iron deficiency anemia, unspecified: Secondary | ICD-10-CM

## 2023-06-14 MED ORDER — METFORMIN HCL 500 MG PO TABS: ORAL_TABLET | ORAL | 3 refills | Status: DC

## 2023-06-14 NOTE — Telephone Encounter (Signed)
Spoke to pt. She advised how she is taking it. Sent rx to pharmacy

## 2023-06-14 NOTE — Telephone Encounter (Signed)
Patient is asking if Dr. Alphonsus Sias will mange this medication for her now rather than her neurologist. He was using it to treat MS as well as her diabetes, patient says it seems to work well with her diabetes.

## 2023-06-14 NOTE — Telephone Encounter (Signed)
Prescription Request  06/14/2023  LOV: 04/15/2023  What is the name of the medication or equipment? metFORMIN (GLUCOPHAGE) 500 MG tablet   Have you contacted your pharmacy to request a refill? No   Which pharmacy would you like this sent to?  [id number] [address] [place] [address] Phone: [phone] Fax: [phone]    Patient notified that their request is being sent to the clinical staff for review and that they should receive a response within 2 business days.   Please advise at Mobile [phone] (mobile)  Patient is asking if Dr. Alphonsus Sias will mange this medication for her now rather than her neurologist. He was using it to treat MS as well as her diabetes, patient says it seems to work well with her diabetes.

## 2023-06-14 NOTE — Progress Notes (Signed)
cbc

## 2023-06-14 NOTE — Progress Notes (Signed)
   Care Guide Note  06/14/2023 Name: Emily Moon MRN: [id number] DOB: Jan 22, 1961  Referred by: Karie Schwalbe, MD Reason for referral : Care Coordination (Outreach to schedule with Pharm d Aug-September )   Emily Moon is a 62 y.o. year old female who is a primary care patient of Karie Schwalbe, MD. Emily Moon was referred to the pharmacist for assistance related to DM.    A third unsuccessful telephone outreach was attempted today to contact the patient who was referred to the pharmacy team for assistance with medication management. The Population Health team is pleased to engage with this patient at any time in the future upon receipt of referral and should he/she be interested in assistance from the [facility] team.   Emily Moon, RMA Care Guide [facility]  [place], [place] [address] Direct Dial: [phone] [email or web address]

## 2023-06-15 ENCOUNTER — Inpatient Hospital Stay: Payer: PPO

## 2023-06-15 ENCOUNTER — Telehealth: Payer: Self-pay

## 2023-06-15 NOTE — Telephone Encounter (Signed)
Patient needs to r/s today's appointment Call back # [phone]

## 2023-06-16 ENCOUNTER — Other Ambulatory Visit: Payer: Self-pay

## 2023-06-16 ENCOUNTER — Inpatient Hospital Stay: Payer: PPO | Attending: Oncology

## 2023-06-16 DIAGNOSIS — D509 Iron deficiency anemia, unspecified: Secondary | ICD-10-CM

## 2023-06-16 LAB — CMP (CANCER CENTER ONLY)
ALT: 15 U/L (ref 0–44)
AST: 15 U/L (ref 15–41)
Albumin: 4.3 g/dL (ref 3.5–5.0)
Alkaline Phosphatase: 37 U/L — ABNORMAL LOW (ref 38–126)
Anion gap: 11 (ref 5–15)
BUN: 25 mg/dL — ABNORMAL HIGH (ref 8–23)
CO2: 26 mmol/L (ref 22–32)
Calcium: 9.3 mg/dL (ref 8.9–10.3)
Chloride: 99 mmol/L (ref 98–111)
Creatinine: 0.97 mg/dL (ref 0.44–1.00)
GFR, Estimated: >60 mL/min (ref >60)
Glucose, Bld: 129 mg/dL — ABNORMAL HIGH (ref 70–99)
Potassium: 4 mmol/L (ref 3.5–5.1)
Sodium: 136 mmol/L (ref 135–145)
Total Bilirubin: 0.5 mg/dL (ref 0.3–1.2)
Total Protein: 7.3 g/dL (ref 6.5–8.1)

## 2023-06-16 LAB — CBC WITH DIFFERENTIAL/PLATELET
Abs Immature Granulocytes: 0.15 10*3/uL — ABNORMAL HIGH (ref 0.00–0.07)
Basophils Absolute: 0.1 10*3/uL (ref 0.0–0.1)
Basophils Relative: 1 %
Eosinophils Absolute: 0.3 10*3/uL (ref 0.0–0.5)
Eosinophils Relative: 3 %
HCT: 39.4 % (ref 36.0–46.0)
Hemoglobin: 12.9 g/dL (ref 12.0–15.0)
Immature Granulocytes: 2 %
Lymphocytes Relative: 24 %
Lymphs Abs: 1.9 10*3/uL (ref 0.7–4.0)
MCH: 29.5 pg (ref 26.0–34.0)
MCHC: 32.7 g/dL (ref 30.0–36.0)
MCV: 90.2 fL (ref 80.0–100.0)
Monocytes Absolute: 0.5 10*3/uL (ref 0.1–1.0)
Monocytes Relative: 6 %
Neutro Abs: 5.1 10*3/uL (ref 1.7–7.7)
Neutrophils Relative %: 64 %
Platelets: 436 10*3/uL — ABNORMAL HIGH (ref 150–400)
RBC: 4.37 MIL/uL (ref 3.87–5.11)
RDW: 13.1 % (ref 11.5–15.5)
WBC: 8 10*3/uL (ref 4.0–10.5)
nRBC: 0 % (ref 0.0–0.2)

## 2023-06-16 LAB — IRON AND TIBC
Iron: 103 ug/dL (ref 28–170)
Saturation Ratios: 19 % (ref 10.4–31.8)
TIBC: 557 ug/dL — ABNORMAL HIGH (ref 250–450)
UIBC: 454 ug/dL

## 2023-06-16 LAB — FERRITIN: Ferritin: 55 ng/mL (ref 11–307)

## 2023-06-16 MED FILL — Iron Sucrose Inj 20 MG/ML (Fe Equiv): INTRAVENOUS | Qty: 10 | Status: AC

## 2023-06-17 ENCOUNTER — Encounter: Payer: Self-pay | Admitting: Oncology

## 2023-06-17 ENCOUNTER — Inpatient Hospital Stay: Payer: PPO

## 2023-06-17 ENCOUNTER — Inpatient Hospital Stay: Payer: PPO | Attending: Oncology | Admitting: Oncology

## 2023-06-17 VITALS — BP 96/46 | HR 86 | Temp 99.6°F | Resp 18 | Ht 68.5 in | Wt 238.0 lb

## 2023-06-17 DIAGNOSIS — Z833 Family history of diabetes mellitus: Secondary | ICD-10-CM | POA: Insufficient documentation

## 2023-06-17 DIAGNOSIS — E785 Hyperlipidemia, unspecified: Secondary | ICD-10-CM | POA: Diagnosis not present

## 2023-06-17 DIAGNOSIS — K219 Gastro-esophageal reflux disease without esophagitis: Secondary | ICD-10-CM | POA: Insufficient documentation

## 2023-06-17 DIAGNOSIS — D75839 Thrombocytosis, unspecified: Secondary | ICD-10-CM | POA: Insufficient documentation

## 2023-06-17 DIAGNOSIS — M797 Fibromyalgia: Secondary | ICD-10-CM | POA: Diagnosis not present

## 2023-06-17 DIAGNOSIS — E119 Type 2 diabetes mellitus without complications: Secondary | ICD-10-CM | POA: Diagnosis not present

## 2023-06-17 DIAGNOSIS — Z7985 Long-term (current) use of injectable non-insulin antidiabetic drugs: Secondary | ICD-10-CM | POA: Diagnosis not present

## 2023-06-17 DIAGNOSIS — J4489 Other specified chronic obstructive pulmonary disease: Secondary | ICD-10-CM | POA: Insufficient documentation

## 2023-06-17 DIAGNOSIS — Z79899 Other long term (current) drug therapy: Secondary | ICD-10-CM | POA: Diagnosis not present

## 2023-06-17 DIAGNOSIS — Z888 Allergy status to other drugs, medicaments and biological substances status: Secondary | ICD-10-CM | POA: Insufficient documentation

## 2023-06-17 DIAGNOSIS — G4730 Sleep apnea, unspecified: Secondary | ICD-10-CM | POA: Diagnosis not present

## 2023-06-17 DIAGNOSIS — Z882 Allergy status to sulfonamides status: Secondary | ICD-10-CM | POA: Diagnosis not present

## 2023-06-17 DIAGNOSIS — Z885 Allergy status to narcotic agent status: Secondary | ICD-10-CM | POA: Diagnosis not present

## 2023-06-17 DIAGNOSIS — Z881 Allergy status to other antibiotic agents status: Secondary | ICD-10-CM | POA: Insufficient documentation

## 2023-06-17 DIAGNOSIS — G894 Chronic pain syndrome: Secondary | ICD-10-CM | POA: Diagnosis not present

## 2023-06-17 DIAGNOSIS — F319 Bipolar disorder, unspecified: Secondary | ICD-10-CM | POA: Diagnosis not present

## 2023-06-17 DIAGNOSIS — Z87891 Personal history of nicotine dependence: Secondary | ICD-10-CM | POA: Insufficient documentation

## 2023-06-17 DIAGNOSIS — M1990 Unspecified osteoarthritis, unspecified site: Secondary | ICD-10-CM | POA: Insufficient documentation

## 2023-06-17 DIAGNOSIS — Z7984 Long term (current) use of oral hypoglycemic drugs: Secondary | ICD-10-CM | POA: Insufficient documentation

## 2023-06-17 DIAGNOSIS — Z87442 Personal history of urinary calculi: Secondary | ICD-10-CM | POA: Insufficient documentation

## 2023-06-17 DIAGNOSIS — F1290 Cannabis use, unspecified, uncomplicated: Secondary | ICD-10-CM | POA: Insufficient documentation

## 2023-06-17 DIAGNOSIS — R531 Weakness: Secondary | ICD-10-CM | POA: Diagnosis not present

## 2023-06-17 DIAGNOSIS — D509 Iron deficiency anemia, unspecified: Secondary | ICD-10-CM | POA: Diagnosis not present

## 2023-06-17 DIAGNOSIS — Z825 Family history of asthma and other chronic lower respiratory diseases: Secondary | ICD-10-CM | POA: Insufficient documentation

## 2023-06-17 DIAGNOSIS — R5383 Other fatigue: Secondary | ICD-10-CM | POA: Diagnosis not present

## 2023-06-17 DIAGNOSIS — Z8249 Family history of ischemic heart disease and other diseases of the circulatory system: Secondary | ICD-10-CM | POA: Insufficient documentation

## 2023-06-17 DIAGNOSIS — Z809 Family history of malignant neoplasm, unspecified: Secondary | ICD-10-CM | POA: Insufficient documentation

## 2023-06-17 DIAGNOSIS — G35 Multiple sclerosis: Secondary | ICD-10-CM | POA: Insufficient documentation

## 2023-06-17 DIAGNOSIS — Z8349 Family history of other endocrine, nutritional and metabolic diseases: Secondary | ICD-10-CM | POA: Insufficient documentation

## 2023-06-17 NOTE — Progress Notes (Signed)
[name]  [phone] [phone]  ID: Emily Moon OB: Mar 19, 1961  MR#: [id number]  AOZ#:[id number]  Patient Care Team: Karie Schwalbe, MD as PCP - General (Internal Medicine) Debbe Odea, MD as PCP - Cardiology (Cardiology) Galen Manila, MD as Referring Physician (Ophthalmology) Kathyrn Sheriff, [facility] (Inactive) as Pharmacist (Pharmacist) Jeralyn Ruths, MD as Consulting Physician (Hematology and Oncology)  CHIEF COMPLAINT: Iron deficiency anemia.  INTERVAL HISTORY: Patient returns to clinic today for repeat laboratory work and further evaluation.  She continues to have multiple medical complaints that are all chronic and unchanged.  She continues to have chronic weakness and fatigue. She has no neurologic complaints.  She denies any recent fevers or illnesses.  She has a good appetite and denies weight loss.  She has no chest pain, shortness of breath, cough, or hemoptysis.  She denies any nausea, vomiting, constipation, or diarrhea.  She has no melena or hematochezia.  She has no urinary complaints.  Patient offers no further specific complaints today.  REVIEW OF SYSTEMS:   Review of Systems  Constitutional:  Positive for malaise/fatigue. Negative for fever and weight loss.  Respiratory: Negative.  Negative for cough, hemoptysis and shortness of breath.   Cardiovascular: Negative.  Negative for chest pain and leg swelling.  Gastrointestinal: Negative.  Negative for abdominal pain, blood in stool and melena.  Genitourinary: Negative.  Negative for hematuria.  Musculoskeletal: Negative.  Negative for back pain.  Skin: Negative.  Negative for rash.  Neurological:  Positive for weakness. Negative for dizziness, focal weakness and headaches.  Psychiatric/Behavioral: Negative.  The patient is not nervous/anxious.     As per HPI. Otherwise, a complete review of systems is negative.  PAST MEDICAL HISTORY: Past Medical  History:  Diagnosis Date   Acute pyelonephritis 12/25/2021   Arthritis    osteo   Asthma    Bipolar disorder (HCC) 05/21/2014   Cataracts, bilateral    Cervical radiculitis    Chest pain    Chronic pain syndrome    COPD (chronic obstructive pulmonary disease) (HCC)    DDD (degenerative disc disease), cervical    also back   Depression    Diabetes mellitus without complication (HCC)    Dizziness    Positional   Edema    feet/legs   Epigastric pain    Fibromyalgia syndrome    Fungal infection    Finger nails   GERD (gastroesophageal reflux disease)    Gout    Heart palpitations    Hip dysplasia, 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   History of kidney stones    Hx of sepsis    Hypercholesterolemia    IDA (iron deficiency anemia)    Multiple sclerosis (HCC)    weakness   Osteoporosis    osteoarthritis   Pneumonia    PONV (postoperative nausea and vomiting)    no problem after cataract surgery   Prediabetes 07/22/2018   Psoriasis    Sciatica    Seasonal allergies    Shortness of breath dyspnea    wheezing   Sleep apnea 2012   sleep study / slight, no interventions   Urinary frequency    Vitamin B 12 deficiency    Weight gain 06/21/2014    PAST SURGICAL HISTORY: Past Surgical History:  Procedure Laterality Date   CATARACT EXTRACTION W/PHACO Left 05/21/2015   Procedure: CATARACT EXTRACTION PHACO AND INTRAOCULAR LENS PLACEMENT (IOC);  Surgeon: Galen Manila, MD;  Location: [facility] ORS;  Service: Ophthalmology;  Laterality: Left;  [place]  00:35 AP% 22.9 CDE 8.11 fluid pack lot #[id number]   CATARACT EXTRACTION W/PHACO Right 06/04/2015   Procedure: CATARACT EXTRACTION PHACO AND INTRAOCULAR LENS PLACEMENT (IOC);  Surgeon: Galen Manila, MD;  Location: [facility] OR[facility];  Service: Ophthalmology;  Laterality: Right;  US:00:48 AP%: 10.5 CDE:5.08 Fluid lot #[id number]   CYSTOSCOPY/URETEROSCOPY/HOLMIUM LASER/STENT PLACEMENT Bilateral 09/22/2016   Procedure: CYSTOSCOPY/URETEROSCOPY/HOLMIUM  LASER/STENT PLACEMENT;  Surgeon: Vanna Scotland, MD;  Location: [facility] ORS;  Service: Urology;  Laterality: Bilateral;   EYE SURGERY  2015   tissue biopsy   FOOT SURGERY  2015   JOINT REPLACEMENT Left 2013   hip replacement   LITHOTRIPSY     PTOSIS REPAIR Bilateral 02/18/2016   Procedure: BILATERAL PTOSIS REPAIR UPPER EYELIDS;  Surgeon: Imagene Riches, MD;  Location: [facility];  Service: Ophthalmology;  Laterality: Bilateral;  LEAVE PT EARLY AM   thumb surgery Right    TONSILLECTOMY  1973   TOTAL HIP ARTHROPLASTY Right 07/02/2021   Procedure: TOTAL HIP ARTHROPLASTY ANTERIOR APPROACH;  Surgeon: Ollen Gross, MD;  Location: [facility];  Service: Orthopedics;  Laterality: Right;    FAMILY HISTORY: Family History  Problem Relation Age of Onset   Cancer Mother    Heart disease Mother    Diabetes Mother    Sleep apnea Mother    Cancer Father        Abdomen with mastasis   High Cholesterol Father    Kidney disease Neg Hx    Bladder Cancer Neg Hx    Prostate cancer Neg Hx    Kidney cancer Neg Hx     ADVANCED DIRECTIVES (Y/N):  N  HEALTH MAINTENANCE: Social History   Tobacco Use   Smoking status: Former    Current packs/day: 0.00    Average packs/day: 1 pack/day for 25.0 years (25.0 ttl pk-yrs)    Types: Cigarettes    Start date: 3    Quit date: 2019    Years since quitting: 5.5    Passive exposure: Past   Smokeless tobacco: Never   Tobacco comments:    occasional use  Vaping Use   Vaping status: Former  Substance Use Topics   Alcohol use: No    Alcohol/week: 0.0 standard drinks of alcohol   Drug use: Yes    Types: Methylphenidate, Marijuana    Comment: Delta 8 - once per day     Colonoscopy:  PAP:  Bone density:  Lipid panel:  Allergies  Allergen Reactions   Albuterol Shortness Of Breath and Other (See Comments)    Makes pt feel jittery/ tacycardic   Crestor [Rosuvastatin] Other (See Comments)    Joint pain, muscle pain, and hair loss   Halcion  [Triazolam] Other (See Comments)    Dizziness,headaches,bladder problems   Levaquin [Levofloxacin In D5w] Diarrhea and Itching    Shoulder pain   Metformin And Related Rash   Naproxen Sodium     Patient tolerates in small doses. Her reaction is swelling of the lower extremities.    Ozempic (0.25 Or 0.5 Mg-Dose) [Semaglutide(0.25 Or 0.5mg -Dos)] Itching   Cefaclor Other (See Comments)    Doesn't remember---unsure if actually allergic    Prednisone     Elevates blood sugars   Sulfa Antibiotics Itching    Unsure of reaction possibly itching   Tramadol Itching and Nausea And Vomiting   Zinc Other (See Comments)    constipation  constipation    Aripiprazole Other (See Comments)    Muscle tension/cramping   Diclofenac Sodium Rash    "made  very sick"    Current Outpatient Medications  Medication Sig Dispense Refill   aspirin EC 81 MG tablet Take 81 mg by mouth at bedtime. Swallow whole.     Azelastine HCl 137 MCG/SPRAY SOLN PLACE 2 SPRAYS INTO BOTH NOSTRILS 2 (TWO) TIMES DAILY 30 mL 11   B Complex-Biotin-FA (BIG 100, BIOTIN, PO) Take 300 mg by mouth at bedtime.     bisacodyl (DULCOLAX) 5 MG EC tablet Take 5 mg by mouth daily as needed for moderate constipation.     cetirizine (ZYRTEC) 10 MG tablet Take 10 mg by mouth daily.     clindamycin (CLEOCIN T) 1 % external solution Apply to scalp once or twice a day prn bumps 30 mL 2   Continuous Glucose Sensor (FREESTYLE LIBRE 3 SENSOR) MISC PLACE 1 SENSOR ON THE SKIN EVERY 14 DAYS. USE TO CHECK GLUCOSE CONTINUOUSLY 6 each 1   cyanocobalamin (VITAMIN B12) 1000 MCG/ML injection INJECT 1 ML (1,000 MCG TOTAL) INTO THE MUSCLE EVERY 30 (THIRTY) DAYS. FOR B12 VITAMIN 3 mL 3   desipramine (NORPRAMIN) 25 MG tablet 2  qam 60 tablet 6   diphenhydrAMINE (BENADRYL) 2 % cream Apply topically 3 (three) times daily as needed for itching.     Dulaglutide (TRULICITY) 3 MG/0.5ML SOPN Inject 3 mg into the skin once a week. 2 mL 1   DULoxetine (CYMBALTA) 20 MG  capsule TAKE 2 CAPSULES BY MOUTH EVERY DAY 180 capsule 2   estradiol (ESTRACE) 0.1 MG/GM vaginal cream Estrogen Cream Instruction Discard applicator Apply pea sized amount to tip of finger to urethra before bed. Wash hands well after application. Use Monday, Wednesday and Friday 42.5 g 11   Evolocumab (REPATHA SURECLICK) 140 MG/ML SOAJ Inject 140 mg into the skin every 14 (fourteen) days. 2 mL 5   fenofibrate (TRICOR) 145 MG tablet Take 1 tablet (145 mg total) by mouth daily. For cholesterol. Please call [phone] for an appointment. 90 tablet 3   hydrOXYzine (VISTARIL) 50 MG capsule Take 2 capsules (100 mg total) by mouth at bedtime. TAKE 1 CAPSLE BY MOUTH AT NOON, 1 AT 6 PM, AND 1 AS NEEDED 90 capsule 3   ibuprofen (ADVIL) 200 MG tablet Take 800 mg by mouth 2 (two) times daily.     ketoconazole (NIZORAL) 2 % shampoo Apply 1 Application topically as directed. Wash from waist up 3 times a week for 2 months, then decrease to 1 time a month, let sit 5 minutes and rinse off 120 mL 6   lamoTRIgine (LAMICTAL) 150 MG tablet Take 2 tablets (300 mg total) by mouth daily at bedtime. 60 tablet 6   levalbuterol (XOPENEX HFA) 45 MCG/ACT inhaler Inhale 2 puffs into the lungs every 4 (four) hours as needed for wheezing.     Lysine 500 MG TABS Take 1,000 mg by mouth daily.     metFORMIN (GLUCOPHAGE) 500 MG tablet Take 2 tabs by mouth every morning and 1 tab in the evening 270 tablet 3   methocarbamol (ROBAXIN) 500 MG tablet TAKE 1-2 TABLETS (500-1,000 MG TOTAL) BY MOUTH AT BEDTIME AS NEEDED FOR MUSCLE SPASMS. 180 tablet 0   metoprolol tartrate (LOPRESSOR) 50 MG tablet Take 1 tablet (50 mg total) by mouth 2 (two) times daily. 180 tablet 3   montelukast (SINGULAIR) 10 MG tablet TAKE 1 TABLET BY MOUTH EVERYDAY AT BEDTIME 90 tablet 1   mupirocin ointment (BACTROBAN) 2 % Apply 1 Application topically daily. Qd to wound on finger 22 g 1   neomycin-bacitracin-polymyxin (  NEOSPORIN) [phone] ointment Apply 1 application  topically 4 (four) times daily as needed (for cut/scrapes.).     NEOMYCIN-POLYMYXIN-HYDROCORTISONE (CORTISPORIN) 1 % SOLN OTIC solution Apply 1-2 drops to toe BID after soaking 10 mL 1   NON FORMULARY Garden of life women's probiotics     pantoprazole (PROTONIX) 40 MG tablet TAKE 1 TABLET (40 MG TOTAL) BY MOUTH 2 (TWO) TIMES DAILY BEFORE A MEAL. FOR HEARTBURN 180 tablet 3   polyvinyl alcohol (LIQUIFILM TEARS) 1.4 % ophthalmic solution Place 2 drops into both eyes at bedtime.     Ruxolitinib Phosphate (OPZELURA) 1.5 % CREA Apply to aa's rash QD PRN. 60 g 3   STIOLTO RESPIMAT 2.5-2.5 MCG/ACT AERS Inhale 2 puffs into the lungs at bedtime.     temazepam (RESTORIL) 30 MG capsule TAKE ONE TO TWO CAPSULES BY MOUTH NIGHTLY AT BEDTIME 60 capsule 5   traZODone (DESYREL) 50 MG tablet Take 2 tablets (100 mg total) by mouth at bedtime. 60 tablet 7   valACYclovir (VALTREX) 1000 MG tablet Take 2 tablets by mouth twice daily for 1 day as needed for herpes outbreak. 12 tablet 0   Vitamin D3 (VITAMIN D) 25 MCG tablet Take 3,000 Units by mouth daily.     predniSONE (DELTASONE) 5 MG tablet Take 1 tablet (5 mg total) by mouth daily with breakfast. Patient has written instruction sheet for two week taper. (Patient not taking: Reported on 06/17/2023) 100 tablet 0   No current facility-administered medications for this visit.    OBJECTIVE: Vitals:   06/17/23 1406  BP: (!) 96/46  Pulse: 86  Resp: 18  Temp: 99.6 F (37.6 C)  SpO2: 94%     Body mass index is 35.66 kg/m.    ECOG FS:0 - Asymptomatic  General: Well-developed, well-nourished, no acute distress. Eyes: Pink conjunctiva, anicteric sclera. HEENT: Normocephalic, moist mucous membranes. Lungs: No audible wheezing or coughing. Heart: Regular rate and rhythm. Abdomen: Soft, nontender, no obvious distention. Musculoskeletal: No edema, cyanosis, or clubbing. Neuro: Alert, answering all questions appropriately. Cranial nerves grossly intact. Skin: No rashes  or petechiae noted. Psych: Normal affect.  LAB RESULTS:  Lab Results  Component Value Date   NA 136 06/16/2023   K 4.0 06/16/2023   CL 99 06/16/2023   CO2 26 06/16/2023   GLUCOSE 129 (H) 06/16/2023   BUN 25 (H) 06/16/2023   CREATININE 0.97 06/16/2023   CALCIUM 9.3 06/16/2023   PROT 7.3 06/16/2023   ALBUMIN 4.3 06/16/2023   AST 15 06/16/2023   ALT 15 06/16/2023   ALKPHOS 37 (L) 06/16/2023   BILITOT 0.5 06/16/2023   GFRNONAA >60 06/16/2023   GFRAA >60 02/19/2020    Lab Results  Component Value Date   WBC 8.0 06/16/2023   NEUTROABS 5.1 06/16/2023   HGB 12.9 06/16/2023   HCT 39.4 06/16/2023   MCV 90.2 06/16/2023   PLT 436 (H) 06/16/2023   Lab Results  Component Value Date   IRON 103 06/16/2023   TIBC 557 (H) 06/16/2023   IRONPCTSAT 19 06/16/2023   Lab Results  Component Value Date   FERRITIN 55 06/16/2023     STUDIES: No results found.  ASSESSMENT: Iron deficiency anemia.  PLAN:    Iron deficiency anemia: Resolved.  Patient's iron stores and hemoglobin continues to be within normal limits.  Patient reports she cannot tolerate oral iron supplementation.  She last received IV Venofer on February 05, 2022.  No intervention is needed.  No further follow-up has been scheduled.  Please refer  patient back if there are any questions or concerns.   Thrombocytosis: Chronic and unchanged.  I spent a total of 20 minutes reviewing chart data, face-to-face evaluation with the patient, counseling and coordination of care as detailed above.   Patient expressed understanding and was in agreement with this plan. She also understands that She can call clinic at any time with any questions, concerns, or complaints.    Jeralyn Ruths, MD   06/17/2023 4:19 PM

## 2023-06-18 ENCOUNTER — Other Ambulatory Visit (HOSPITAL_COMMUNITY): Payer: Self-pay | Admitting: Psychiatry

## 2023-06-18 ENCOUNTER — Ambulatory Visit: Payer: PPO | Admitting: Physician Assistant

## 2023-06-18 DIAGNOSIS — N3281 Overactive bladder: Secondary | ICD-10-CM | POA: Diagnosis not present

## 2023-06-18 DIAGNOSIS — N390 Urinary tract infection, site not specified: Secondary | ICD-10-CM | POA: Diagnosis not present

## 2023-06-18 DIAGNOSIS — R39198 Other difficulties with micturition: Secondary | ICD-10-CM

## 2023-06-18 DIAGNOSIS — R509 Fever, unspecified: Secondary | ICD-10-CM

## 2023-06-18 LAB — MICROSCOPIC EXAMINATION

## 2023-06-18 LAB — URINALYSIS, COMPLETE
Bilirubin, UA: NEGATIVE
Glucose, UA: NEGATIVE
Ketones, UA: NEGATIVE
Leukocytes,UA: NEGATIVE
Nitrite, UA: NEGATIVE
Protein,UA: NEGATIVE
Specific Gravity, UA: >1.03 — ABNORMAL HIGH (ref 1.005–1.030)
Urobilinogen, Ur: 0.2 mg/dL (ref 0.2–1.0)
pH, UA: 5.5 (ref 5.0–7.5)

## 2023-06-18 NOTE — Progress Notes (Signed)
PTNS  Session # 2 (lifetime #14)  Health & Social Factors: no change Caffeine: 10 oz Alcohol: 0 Daytime voids #per day: 20 Night-time voids #per night: 1 Urgency: strong and severe Incontinence Episodes #per day: 0 Ankle used: right Treatment Setting: 7 Feeling/ Response: both Comments: pt tolerated well  Additional notes: She provided a urine sample due to concern for UTI. Symptoms include low grade temp <100F and occasional burning. UA is bland today. She is also concerned about renal fn on recent labs. We discussed that her renal fn is WNL and I am not concerned she has renal impairment. Carman Ching, PA-C   Performed By: Randa Lynn, RMA

## 2023-06-22 ENCOUNTER — Encounter (INDEPENDENT_AMBULATORY_CARE_PROVIDER_SITE_OTHER): Payer: Self-pay | Admitting: Family Medicine

## 2023-06-22 ENCOUNTER — Ambulatory Visit: Payer: PPO

## 2023-06-22 ENCOUNTER — Ambulatory Visit: Payer: PPO | Admitting: Oncology

## 2023-06-22 ENCOUNTER — Ambulatory Visit (INDEPENDENT_AMBULATORY_CARE_PROVIDER_SITE_OTHER): Payer: PPO | Admitting: Family Medicine

## 2023-06-22 ENCOUNTER — Other Ambulatory Visit (HOSPITAL_COMMUNITY): Payer: Self-pay | Admitting: Psychiatry

## 2023-06-22 ENCOUNTER — Other Ambulatory Visit (HOSPITAL_COMMUNITY): Payer: Self-pay | Admitting: *Deleted

## 2023-06-22 VITALS — BP 101/72 | HR 84 | Temp 98.6°F | Ht 68.5 in | Wt 235.0 lb

## 2023-06-22 DIAGNOSIS — Z7984 Long term (current) use of oral hypoglycemic drugs: Secondary | ICD-10-CM

## 2023-06-22 DIAGNOSIS — Z7985 Long-term (current) use of injectable non-insulin antidiabetic drugs: Secondary | ICD-10-CM | POA: Diagnosis not present

## 2023-06-22 DIAGNOSIS — G894 Chronic pain syndrome: Secondary | ICD-10-CM | POA: Diagnosis not present

## 2023-06-22 DIAGNOSIS — Z6835 Body mass index (BMI) 35.0-35.9, adult: Secondary | ICD-10-CM

## 2023-06-22 DIAGNOSIS — F3175 Bipolar disorder, in partial remission, most recent episode depressed: Secondary | ICD-10-CM

## 2023-06-22 DIAGNOSIS — E1165 Type 2 diabetes mellitus with hyperglycemia: Secondary | ICD-10-CM | POA: Diagnosis not present

## 2023-06-22 MED ORDER — TEMAZEPAM 30 MG PO CAPS: ORAL_CAPSULE | ORAL | 0 refills | Status: DC

## 2023-06-22 NOTE — Assessment & Plan Note (Signed)
She has an upcoming visit with Dr Donell Beers 8/20 to discuss her depressed mood.  She does not feel a threat to herself or others and declined the need for inpatient psych care.  She is on desipramine 25 mg daily.  She is upset with the relationship that she has with her son and claims to have no friends here.  She talks to her sister in [place] some.  She is not ready to do group exercise classes to meet people and her willingness to make changes is low.  We discussed the importance of treating depressed mood before she can fully implement a plan for healthy weight.  She has been opposed to our recommended diet and exercise changes.  She is reliving the deaths of her family members.  She agrees to seeing Dr Dewaine Conger for CBT and will likely need ongoing counseling after that

## 2023-06-22 NOTE — Assessment & Plan Note (Signed)
Lab Results  Component Value Date   HGBA1C 6.6 (H) 03/23/2023   Pt has had trouble complying with recommended dietary changes She has not interest in following her prescribed diet or making any changes Her mood disorder has had a big effect on her eating behaviors She has her own barriers to exercise created by herself She reports good compliance with her Trulicity and metformin  Keep follow up visits scheduled with PCP to manage T2DM Encouraged improved compliance with reducing intake of starches and sweets and adding in small bits of physical activity to her day

## 2023-06-22 NOTE — Progress Notes (Signed)
Office: [phone]  /  Fax: [phone]  WEIGHT SUMMARY AND BIOMETRICS  Starting Date: 03/23/23  Starting Weight: 247lb   Weight Lost Since Last Visit: 3lb   Vitals Temp: 98.6 F (Moon C) BP: 101/72 Pulse Rate: 84 SpO2: 96 %   Body Composition  Body Fat %: 44.7 % Fat Mass (lbs): 105 lbs Muscle Mass (lbs): 123.4 lbs Total Body Water (lbs): 81 lbs Visceral Fat Rating : 13   HPI  Chief Complaint: OBESITY  Emily Moon is here to discuss her progress with her obesity treatment plan. She is on the the Category 1 Plan and states she is following her eating plan approximately 50 % of the time. She states she is exercising 0 minutes 0 times per week.   Interval History:  Since last office visit she is down 3 lb She continues to follow her 'own meal plan'  Her blood sugars have been running high  She did require a course of prednisone 3 weeks ago She is getting up at 1 pm and her CGM shows readings 120s on avg first thing She admits to 'eating whatever' during her one meal a day based on her mood Her blood sugars are running higher She is seeing Dr Donell Beers for mood disorder and is currently not seeing a therapist She has some discord with her son who lives in [place] and she has been feeling more depressed and hopeless She has a net wight loss of 12 lb in the past 3 mos of medically supervised weight management  Pharmacotherapy: Trulicity 3 mg weekly per PCP for T2DM  PHYSICAL EXAM:  Blood pressure 101/72, pulse 84, temperature 98.6 F (Moon C), height 5' 8.5" (1.74 m), weight 235 lb (106.6 kg), last menstrual period 08/23/2014, SpO2 96%. Body mass index is 35.21 kg/m.  General: She is overweight, cooperative, alert, well developed, and in no acute distress. PSYCH: Has normal mood, affect and thought process.   Lungs: Normal breathing effort, no conversational dyspnea.   ASSESSMENT AND PLAN  TREATMENT PLAN FOR OBESITY:  Recommended Dietary Goals  Emily Moon is  currently in the action stage of change. As such, her goal is to continue weight management plan. She has agreed to following a lower carbohydrate, vegetable and lean protein rich diet plan. - recommend against intermittent fasting - limit high intake of fast food  Behavioral Intervention  We discussed the following Behavioral Modification Strategies today: increasing lean protein intake, decreasing simple carbohydrates , increasing vegetables, increasing lower glycemic fruits, increasing water intake, work on meal planning and preparation, work on managing stress, creating time for self-care and relaxation measures, avoiding temptations and identifying enticing environmental cues, continue to practice mindfulness when eating, and planning for success.  Additional resources provided today: NA  Recommended Physical Activity Goals  Amer has been advised to work up to 150 minutes of moderate intensity aerobic activity a week and strengthening exercises 2-3 times per week for cardiovascular health, weight loss maintenance and preservation of muscle mass.   She has agreed to Think about ways to increase daily physical activity and overcoming barriers to exercise - we discussed options for group exercise to help her with socialization and making friends  (she feels unready to make any changes)  Pharmacotherapy changes for the treatment of obesity: none   ASSOCIATED CONDITIONS ADDRESSED TODAY  Type 2 diabetes mellitus with hyperglycemia, without long-term current use of insulin Emily Moon) Assessment & Plan: Lab Results  Component Value Date   HGBA1C 6.6 (H) 03/23/2023   Pt  has had trouble complying with recommended dietary changes She has not interest in following her prescribed diet or making any changes Her mood disorder has had a big effect on her eating behaviors She has her own barriers to exercise created by herself She reports good compliance with her Trulicity and metformin  Keep  follow up visits scheduled with PCP to manage T2DM Encouraged improved compliance with reducing intake of starches and sweets and adding in small bits of physical activity to her day   Emily Moon  Emily Moon,Emily Moon  Chronic pain syndrome Assessment & Plan: Chronic pain has been a barrier to increasing her physical activity level and she is not open minded to doing Silver sneakers or other group exercise.  She is on multiple medications for the treatment of chronic pain and has complaints of weakness and dizziness that also impair her ability to walk.  She would be a good candidate for chair yoga or even water aerobics if she would be willing to do group classes Continue current meds per pain management   Bipolar disorder, in partial remission, most recent episode depressed Emily Moon) Assessment & Plan: She has an upcoming visit with Dr Donell Beers 8/20 to discuss her depressed mood.  She does not feel a threat to herself or others and declined the need for inpatient psych care.  She is on desipramine 25 mg daily.  She is upset with the relationship that she has with her son and claims to have no friends here.  She talks to her sister in [place] some.  She is not ready to do group exercise classes to meet people and her willingness to make changes is low.  We discussed the importance of treating depressed mood before she can fully implement a plan for healthy weight.  She has been opposed to our recommended diet and exercise changes.  She is reliving the deaths of her family members.  She agrees to seeing Dr Dewaine Conger for CBT and will likely need ongoing counseling after that       She was informed of the importance of frequent follow up visits to maximize her success with intensive lifestyle modifications for her multiple health conditions.   ATTESTASTION STATEMENTS:  Reviewed by clinician on day of visit: allergies, medications, problem list, medical history,  surgical history, family history, social history, and previous encounter notes pertinent to obesity diagnosis.   I have personally spent 30 minutes total time today in preparation, patient care, nutritional counseling and documentation for this visit, including the following: review of clinical lab tests; review of medical tests/procedures/services.      Glennis Brink, DO DABFM, DABOM [facility] [address] [place], [place] [address] [phone]

## 2023-06-22 NOTE — Assessment & Plan Note (Signed)
Chronic pain has been a barrier to increasing her physical activity level and she is not open minded to doing Silver sneakers or other group exercise.  She is on multiple medications for the treatment of chronic pain and has complaints of weakness and dizziness that also impair her ability to walk.  She would be a good candidate for chair yoga or even water aerobics if she would be willing to do group classes Continue current meds per pain management

## 2023-06-25 ENCOUNTER — Ambulatory Visit: Payer: PPO | Admitting: Physician Assistant

## 2023-06-25 ENCOUNTER — Other Ambulatory Visit (HOSPITAL_COMMUNITY): Payer: Self-pay | Admitting: Psychiatry

## 2023-06-29 ENCOUNTER — Other Ambulatory Visit (HOSPITAL_COMMUNITY): Payer: Self-pay | Admitting: *Deleted

## 2023-06-29 MED ORDER — LAMOTRIGINE 150 MG PO TABS: ORAL_TABLET | ORAL | 0 refills | Status: DC

## 2023-06-29 MED ORDER — HYDROXYZINE PAMOATE 50 MG PO CAPS: 100.0000 mg | ORAL_CAPSULE | Freq: Every day | ORAL | 0 refills | Status: DC

## 2023-06-30 ENCOUNTER — Ambulatory Visit (INDEPENDENT_AMBULATORY_CARE_PROVIDER_SITE_OTHER): Payer: PPO | Admitting: Dermatology

## 2023-06-30 VITALS — BP 127/70 | HR 92

## 2023-06-30 DIAGNOSIS — L814 Other melanin hyperpigmentation: Secondary | ICD-10-CM

## 2023-06-30 DIAGNOSIS — W908XXA Exposure to other nonionizing radiation, initial encounter: Secondary | ICD-10-CM | POA: Diagnosis not present

## 2023-06-30 DIAGNOSIS — L821 Other seborrheic keratosis: Secondary | ICD-10-CM | POA: Diagnosis not present

## 2023-06-30 DIAGNOSIS — D492 Neoplasm of unspecified behavior of bone, soft tissue, and skin: Secondary | ICD-10-CM

## 2023-06-30 DIAGNOSIS — L404 Guttate psoriasis: Secondary | ICD-10-CM

## 2023-06-30 DIAGNOSIS — L578 Other skin changes due to chronic exposure to nonionizing radiation: Secondary | ICD-10-CM | POA: Diagnosis not present

## 2023-06-30 NOTE — Progress Notes (Signed)
   Follow-Up Visit   Subjective  Emily Moon is a 62 y.o. female who presents for the following: Patient c/o red spots on her arms and legs, no symptoms, patient using otc Cerave ointment on her skin daily.   The patient has spots, moles and lesions to be evaluated, some may be new or changing and the patient may have concern these could be cancer.     The following portions of the chart were reviewed this encounter and updated as appropriate: medications, allergies, medical history  Review of Systems:  No other skin or systemic complaints except as noted in HPI or Assessment and Plan.  Objective  Well appearing patient in no apparent distress; mood and affect are within normal limits.  A focused examination was performed of the following areas:face,arms,legs  Relevant physical exam findings are noted in the Assessment and Plan.  right forearm Pink papules        Assessment & Plan   Neoplasm of skin right forearm  Skin / nail biopsy Type of biopsy: punch   Informed consent: discussed and consent obtained   Patient was prepped and draped in usual sterile fashion: area prepped with alochol. Anesthesia: the lesion was anesthetized in a standard fashion   Anesthetic:  1% lidocaine w/ epinephrine 1-100,000 buffered w/ 8.4% NaHCO3 Punch size:  4 mm Suture size:  4-0 Suture type: nylon   Hemostasis achieved with: pressure, aluminum chloride and electrodesiccation   Outcome: patient tolerated procedure well   Post-procedure details: wound care instructions given   Post-procedure details comment:  Ointment and small bandage  Specimen 1 - Surgical pathology Differential Diagnosis: R/O atopic dermatitis vs Bites vs other   Check Margins: No  Start Mupirocin ointment apply during each bandage change (patient already has this prescription)    SEBORRHEIC KERATOSIS - Stuck-on, waxy, tan-brown papules and/or plaques  - Benign-appearing - Discussed benign etiology and  prognosis. - Observe - Call for any changes  LENTIGINES Exam: scattered tan macules Due to sun exposure Treatment Plan: Benign-appearing, observe. Recommend daily broad spectrum sunscreen SPF 30+ to sun-exposed areas, reapply every 2 hours as needed.  Call for any changes    ACTINIC DAMAGE - chronic, secondary to cumulative UV radiation exposure/sun exposure over time - diffuse scaly erythematous macules with underlying dyspigmentation - Recommend daily broad spectrum sunscreen SPF 30+ to sun-exposed areas, reapply every 2 hours as needed.  - Recommend staying in the shade or wearing long sleeves, sun glasses (UVA+UVB protection) and wide brim hats (4-inch brim around the entire circumference of the hat). - Call for new or changing lesions.   Return in about 1 week (around 07/07/2023) for suture removal .  I, Angelique Holm, CMA, am acting as scribe for Armida Sans, MD .   Documentation: I have reviewed the above documentation for accuracy and completeness, and I agree with the above.  Armida Sans, MD

## 2023-06-30 NOTE — Patient Instructions (Addendum)

## 2023-07-01 ENCOUNTER — Ambulatory Visit (INDEPENDENT_AMBULATORY_CARE_PROVIDER_SITE_OTHER): Payer: PPO | Admitting: Physician Assistant

## 2023-07-01 VITALS — BP 90/59 | HR 93

## 2023-07-01 DIAGNOSIS — N3281 Overactive bladder: Secondary | ICD-10-CM

## 2023-07-01 NOTE — Progress Notes (Signed)
PTNS  Session # 3 (lifetime #15)  Health & Social Factors: no change Caffeine: 1 Alcohol: 0 Daytime voids #per day: 20-25 Night-time voids #per night: 2 Urgency: strong Incontinence Episodes #per day: 0 Ankle used: right Treatment Setting: 4 Feeling/ Response: toe flex Comments: Patient tolerated well  Performed By: Carman Ching, PA-C   Follow Up: 1 week

## 2023-07-01 NOTE — Patient Instructions (Signed)

## 2023-07-02 ENCOUNTER — Ambulatory Visit: Payer: PPO | Admitting: Physician Assistant

## 2023-07-02 ENCOUNTER — Ambulatory Visit (HOSPITAL_COMMUNITY): Payer: PPO | Admitting: Psychiatry

## 2023-07-04 ENCOUNTER — Encounter: Payer: Self-pay | Admitting: Cardiology

## 2023-07-04 ENCOUNTER — Encounter: Payer: Self-pay | Admitting: Internal Medicine

## 2023-07-04 ENCOUNTER — Telehealth: Payer: PPO | Admitting: Nurse Practitioner

## 2023-07-04 DIAGNOSIS — U071 COVID-19: Secondary | ICD-10-CM

## 2023-07-04 MED ORDER — BENZONATATE 200 MG PO CAPS
200.0000 mg | ORAL_CAPSULE | Freq: Two times a day (BID) | ORAL | 0 refills | Status: DC | PRN
Start: 2023-07-04 — End: 2023-07-04

## 2023-07-04 MED ORDER — NIRMATRELVIR/RITONAVIR (PAXLOVID)TABLET
3.0000 | ORAL_TABLET | Freq: Two times a day (BID) | ORAL | 0 refills | Status: AC
Start: 2023-07-04 — End: 2023-07-09

## 2023-07-04 NOTE — Patient Instructions (Signed)
Emily Moon, thank you for joining Claiborne Rigg, NP for today's virtual visit.  While this provider is not your primary care provider (PCP), if your PCP is located in our provider database this encounter information will be shared with them immediately following your visit.   A [place] MyChart account gives you access to today's visit and all your visits, tests, and labs performed at [facility] " click here if you don't have a [place] MyChart account or go to [email or web address]  Consent: (Patient) Emily Moon provided verbal consent for this virtual visit at the beginning of the encounter.  Current Medications:  Current Outpatient Medications:    nirmatrelvir/ritonavir (PAXLOVID) 20 x 150 MG & 10 x 100MG  TABS, Take 3 tablets by mouth 2 (two) times daily for 5 days. (Take nirmatrelvir 150 mg two tablets twice daily for 5 days and ritonavir 100 mg one tablet twice daily for 5 days) Patient GFR is 60, Disp: 30 tablet, Rfl: 0   aspirin EC 81 MG tablet, Take 81 mg by mouth at bedtime. Swallow whole., Disp: , Rfl:    Azelastine HCl 137 MCG/SPRAY SOLN, PLACE 2 SPRAYS INTO BOTH NOSTRILS 2 (TWO) TIMES DAILY, Disp: 30 mL, Rfl: 11   B Complex-Biotin-FA (BIG 100, BIOTIN, PO), Take 300 mg by mouth at bedtime., Disp: , Rfl:    bisacodyl (DULCOLAX) 5 MG EC tablet, Take 5 mg by mouth daily as needed for moderate constipation., Disp: , Rfl:    cetirizine (ZYRTEC) 10 MG tablet, Take 10 mg by mouth daily., Disp: , Rfl:    clindamycin (CLEOCIN T) 1 % external solution, Apply to scalp once or twice a day prn bumps, Disp: 30 mL, Rfl: 2   Continuous Glucose Sensor (FREESTYLE LIBRE 3 SENSOR) MISC, PLACE 1 SENSOR ON THE SKIN EVERY 14 DAYS. USE TO CHECK GLUCOSE CONTINUOUSLY, Disp: 6 each, Rfl: 1   cyanocobalamin (VITAMIN B12) 1000 MCG/ML injection, INJECT 1 ML (1,000 MCG TOTAL) INTO THE MUSCLE EVERY 30 (THIRTY) DAYS. FOR B12 VITAMIN, Disp: 3 mL, Rfl: 3   desipramine (NORPRAMIN)  25 MG tablet, 2  qam, Disp: 60 tablet, Rfl: 6   diphenhydrAMINE (BENADRYL) 2 % cream, Apply topically 3 (three) times daily as needed for itching., Disp: , Rfl:    Dulaglutide (TRULICITY) 3 MG/0.5ML SOPN, Inject 3 mg into the skin once a week., Disp: 2 mL, Rfl: 1   DULoxetine (CYMBALTA) 20 MG capsule, TAKE 2 CAPSULES BY MOUTH EVERY DAY, Disp: 180 capsule, Rfl: 2   estradiol (ESTRACE) 0.1 MG/GM vaginal cream, Estrogen Cream Instruction Discard applicator Apply pea sized amount to tip of finger to urethra before bed. Wash hands well after application. Use Monday, Wednesday and Friday, Disp: 42.5 g, Rfl: 11   Evolocumab (REPATHA SURECLICK) 140 MG/ML SOAJ, Inject 140 mg into the skin every 14 (fourteen) days., Disp: 2 mL, Rfl: 5   fenofibrate (TRICOR) 145 MG tablet, Take 1 tablet (145 mg total) by mouth daily. For cholesterol. Please call [phone] for an appointment., Disp: 90 tablet, Rfl: 3   hydrOXYzine (VISTARIL) 50 MG capsule, Take 2 capsules (100 mg total) by mouth at bedtime. TAKE 1 CAPSLE BY MOUTH AT NOON, 1 AT 6 PM, AND 1 AS NEEDED, Disp: 24 capsule, Rfl: 0   ibuprofen (ADVIL) 200 MG tablet, Take 800 mg by mouth 2 (two) times daily., Disp: , Rfl:    ketoconazole (NIZORAL) 2 % shampoo, Apply 1 Application topically as directed. Wash from waist up 3 times a week  for 2 months, then decrease to 1 time a month, let sit 5 minutes and rinse off, Disp: 120 mL, Rfl: 6   lamoTRIgine (LAMICTAL) 150 MG tablet, Take 2 tablets (300 mg total) by mouth daily at bedtime., Disp: 16 tablet, Rfl: 0   levalbuterol (XOPENEX HFA) 45 MCG/ACT inhaler, Inhale 2 puffs into the lungs every 4 (four) hours as needed for wheezing., Disp: , Rfl:    Lysine 500 MG TABS, Take 1,000 mg by mouth daily., Disp: , Rfl:    metFORMIN (GLUCOPHAGE) 500 MG tablet, Take 2 tabs by mouth every morning and 1 tab in the evening, Disp: 270 tablet, Rfl: 3   methocarbamol (ROBAXIN) 500 MG tablet, TAKE 1-2 TABLETS (500-1,000 MG TOTAL) BY MOUTH AT  BEDTIME AS NEEDED FOR MUSCLE SPASMS., Disp: 180 tablet, Rfl: 0   metoprolol tartrate (LOPRESSOR) 50 MG tablet, Take 1 tablet (50 mg total) by mouth 2 (two) times daily., Disp: 180 tablet, Rfl: 3   montelukast (SINGULAIR) 10 MG tablet, TAKE 1 TABLET BY MOUTH EVERYDAY AT BEDTIME, Disp: 90 tablet, Rfl: 1   mupirocin ointment (BACTROBAN) 2 %, Apply 1 Application topically daily. Qd to wound on finger, Disp: 22 g, Rfl: 1   neomycin-bacitracin-polymyxin (NEOSPORIN) [phone] ointment, Apply 1 application topically 4 (four) times daily as needed (for cut/scrapes.)., Disp: , Rfl:    NEOMYCIN-POLYMYXIN-HYDROCORTISONE (CORTISPORIN) 1 % SOLN OTIC solution, Apply 1-2 drops to toe BID after soaking, Disp: 10 mL, Rfl: 1   NON FORMULARY, [employer] women's probiotics, Disp: , Rfl:    pantoprazole (PROTONIX) 40 MG tablet, TAKE 1 TABLET (40 MG TOTAL) BY MOUTH 2 (TWO) TIMES DAILY BEFORE A MEAL. FOR HEARTBURN, Disp: 180 tablet, Rfl: 3   polyvinyl alcohol (LIQUIFILM TEARS) 1.4 % ophthalmic solution, Place 2 drops into both eyes at bedtime., Disp: , Rfl:    predniSONE (DELTASONE) 5 MG tablet, Take 1 tablet (5 mg total) by mouth daily with breakfast. Patient has written instruction sheet for two week taper., Disp: 100 tablet, Rfl: 0   Probiotic Product (PROBIOTIC BLEND PO), Take by mouth., Disp: , Rfl:    Ruxolitinib Phosphate (OPZELURA) 1.5 % CREA, Apply to aa's rash QD PRN., Disp: 60 g, Rfl: 3   STIOLTO RESPIMAT 2.5-2.5 MCG/ACT AERS, Inhale 2 puffs into the lungs at bedtime., Disp: , Rfl:    temazepam (RESTORIL) 30 MG capsule, TAKE ONE TO TWO CAPSULES BY MOUTH NIGHTLY AT BEDTIME, Disp: 30 capsule, Rfl: 0   traZODone (DESYREL) 50 MG tablet, Take 2 tablets (100 mg total) by mouth at bedtime., Disp: 60 tablet, Rfl: 7   valACYclovir (VALTREX) 1000 MG tablet, Take 2 tablets by mouth twice daily for 1 day as needed for herpes outbreak., Disp: 12 tablet, Rfl: 0   Vitamin D3 (VITAMIN D) 25 MCG tablet, Take 3,000 Units by  mouth daily., Disp: , Rfl:    Medications ordered in this encounter:  Meds ordered this encounter  Medications   nirmatrelvir/ritonavir (PAXLOVID) 20 x 150 MG & 10 x 100MG  TABS    Sig: Take 3 tablets by mouth 2 (two) times daily for 5 days. (Take nirmatrelvir 150 mg two tablets twice daily for 5 days and ritonavir 100 mg one tablet twice daily for 5 days) Patient GFR is 60    Dispense:  30 tablet    Refill:  0    Order Specific Question:   Supervising Provider    Answer:   Merrilee Jansky [[id number]]   DISCONTD: benzonatate (TESSALON) 200 MG capsule  Sig: Take 1 capsule (200 mg total) by mouth 2 (two) times daily as needed for cough.    Dispense:  20 capsule    Refill:  0    Order Specific Question:   Supervising Provider    Answer:   Merrilee Jansky [id number]     *If you need refills on other medications prior to your next appointment, please contact your pharmacy*  Follow-Up: Call back or seek an in-person evaluation if the symptoms worsen or if the condition fails to improve as anticipated.  [facility] [phone]  Other Instructions  Please keep well-hydrated and get plenty of rest. Start a saline nasal rinse to flush out your nasal passages. You can use plain Mucinex to help thin congestion. If you have a humidifier, you can use this daily as needed.    You are to wear a mask for 5 days from onset of your symptoms.  After day 5, if you have had no fever and you are feeling better with NO symptoms, you can end masking. Keep in mind you can be contagious 10 days from the onset of symptoms  After day 5 if you have a fever or are having significant symptoms, please wear your mask for full 10 days.   If you note any worsening of symptoms, any significant shortness of breath or any chest pain, please seek ER evaluation ASAP.  Please do not delay care!    If you note any worsening of symptoms, any significant shortness of breath or any chest pain, please seek  ER evaluation ASAP.  Please do not delay care!    If you have been instructed to have an in-person evaluation today at a local Urgent Care facility, please use the link below. It will take you to a list of all of our available [facility], including address, phone number and hours of operation. Please do not delay care.  [facility]  If you or a family member do not have a primary care provider, use the link below to schedule a visit and establish care. When you choose a [facility] primary care physician or advanced practice provider, you gain a long-term partner in health. Find a Primary Care Provider  Learn more about [facility]'s in-office and virtual care options: [facility] Get Care Now

## 2023-07-04 NOTE — Progress Notes (Signed)
Virtual Visit Consent   Emily Moon, you are scheduled for a virtual visit with a  provider today. Just as with appointments in the office, your consent must be obtained to participate. Your consent will be active for this visit and any virtual visit you may have with one of our providers in the next 365 days. If you have a MyChart account, a copy of this consent can be sent to you electronically.  As this is a virtual visit, video technology does not allow for your provider to perform a traditional examination. This may limit your provider's ability to fully assess your condition. If your provider identifies any concerns that need to be evaluated in person or the need to arrange testing (such as labs, EKG, etc.), we will make arrangements to do so. Although advances in technology are sophisticated, we cannot ensure that it will always work on either your end or our end. If the connection with a video visit is poor, the visit may have to be switched to a telephone visit. With either a video or telephone visit, we are not always able to ensure that we have a secure connection.  By engaging in this virtual visit, you consent to the provision of healthcare and authorize for your insurance to be billed (if applicable) for the services provided during this visit. Depending on your insurance coverage, you may receive a charge related to this service.  I need to obtain your verbal consent now. Are you willing to proceed with your visit today? Emily Moon has provided verbal consent on 07/04/2023 for a virtual visit (video or telephone). Emily Moon  Date: 07/04/2023 9:09 AM  Virtual Visit via Video Note   I, Emily Moon, connected with  Emily Moon  ([id number], 1960/12/26) on 07/04/23 at  9:00 AM EDT by a video-enabled telemedicine application and verified that I am speaking with the correct person using two identifiers.  Location: Patient: Virtual Visit Location  Patient: Home Provider: Virtual Visit Location Provider: Home Office   I discussed the limitations of evaluation and management by telemedicine and the availability of in person appointments. The patient expressed understanding and agreed to proceed.    History of Present Illness: Emily Moon is a 62 y.o. who identifies as a female who was assigned female at birth, and is being seen today for COVID Positive.  Emily Moon tested positive for COVID via home antigen test last night.  She is currently experiencing the following symptoms: fever, postnasal drip, pharyngitis and a worsening dry cough, muscle pain, weakness, headaches chest and back pain. Her symptoms started 2 days ago.   Treatment tried at home: Zyrtec, singulair, advil, muscle relaxant.   Problems:  Patient Active Problem List   Diagnosis Date Noted   OAB (overactive bladder) 04/28/2023   Poor diet 04/27/2023   SVT (supraventricular tachycardia) 04/15/2023   Hypoglycemia associated with type 2 diabetes mellitus (HCC) 04/06/2023   Metabolic syndrome 04/06/2023   Elevated TSH 04/06/2023   SOB (shortness of breath) on exertion 03/23/2023   Depression screen 03/23/2023   Breast discharge 02/08/2023   Chronic urticaria 12/03/2022   Angioedema 12/03/2022   History of recurrent UTIs 08/20/2022   Pain of left lower extremity 04/08/2022   Chronic pain of left ankle 04/08/2022   Fracture of tibial plateau 04/08/2022   Pain in joint of left knee 04/08/2022   Prolonged QT interval 03/23/2022   IDA (iron deficiency anemia)    Depression with  anxiety    MS (multiple sclerosis) (HCC) 02/05/2022   Other fatigue 02/05/2022   Numbness 02/05/2022   Memory loss or impairment 02/05/2022   Hypokalemia 12/26/2021   Hyponatremia 12/25/2021   Iron deficiency anemia 12/25/2021   Sepsis (HCC) 12/24/2021   Myalgia due to statin 11/04/2021   Swelling of lymph node 09/02/2021   Primary osteoarthritis of right hip 07/02/2021   Preop  pulmonary/respiratory exam 06/27/2021   Imbalance 12/10/2020   Impairment of balance 12/10/2020   Type 2 diabetes mellitus with hyperglycemia (HCC) 08/28/2020   Diabetes mellitus with circulatory complication, without long-term current use of insulin (HCC) 08/28/2020   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   Painful mouth 07/31/2020   Psoriasis 11/02/2019   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   Asthma 02/10/2019   Epigastric pain 12/21/2018   OSA (obstructive sleep apnea) 12/02/2018   Obstructive sleep apnea syndrome 12/02/2018   Joint swelling 10/24/2018   Environmental and seasonal allergies 10/24/2018   Greater trochanteric bursitis of right hip 09/28/2018   Greater trochanteric pain syndrome 09/28/2018   Vitamin D deficiency 02/15/2018   Preventative health care 11/24/2017   Increased frequency of urination 03/30/2017   B12 deficiency 05/22/2016   Cobalamin deficiency 05/22/2016   Dysuria 04/23/2016   Vertigo 03/16/2016   Dizziness 03/16/2016   Medicare annual wellness visit, subsequent 10/25/2015   Chronic back pain 10/31/2014   Weight gain 06/21/2014   Bipolar disorder (HCC) 05/21/2014   Pre-operative clearance 01/22/2014   Deformity of right foot 12/27/2013   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 09/15/2013   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   Osteoarthritis of right hip joint due to dysplasia 09/15/2013   Difficulty urinating 08/16/2013   Insomnia 09/01/2011   Morbid obesity (HCC) with starting BMI 37 05/04/2011   HLD (hyperlipidemia) 01/20/2011   Chest pain 01/20/2011   Chronic pain syndrome 01/07/2011   RENAL CALCULUS, RECURRENT 11/13/2010   Kidney stone 11/13/2010   Multiple sclerosis, progressive relapsing 08/28/2010   GERD 08/28/2010   OA (osteoarthritis) of hip 08/28/2010   NEPHROLITHIASIS, HX OF 08/28/2010   Gastroesophageal reflux disease 08/28/2010   History of urinary stone 08/28/2010   Osteoarthritis 08/28/2010    Allergies:   Allergies  Allergen Reactions   Albuterol Shortness Of Breath and Other (See Comments)    Makes pt feel jittery/ tacycardic   Crestor [Rosuvastatin] Other (See Comments)    Joint pain, muscle pain, and hair loss   Halcion [Triazolam] Other (See Comments)    Dizziness,headaches,bladder problems   Levaquin [Levofloxacin In D5w] Diarrhea and Itching    Shoulder pain   Metformin And Related Rash   Naproxen Sodium     Patient tolerates in small doses. Her reaction is swelling of the lower extremities.    Ozempic (0.25 Or 0.5 Mg-Dose) [Semaglutide(0.25 Or 0.5mg -Dos)] Itching   Cefaclor Other (See Comments)    Doesn't remember---unsure if actually allergic    Prednisone     Elevates blood sugars   Sulfa Antibiotics Itching    Unsure of reaction possibly itching   Tramadol Itching and Nausea And Vomiting   Zinc Other (See Comments)    constipation  constipation    Aripiprazole Other (See Comments)    Muscle tension/cramping   Diclofenac Sodium Rash    "made very sick"   Medications:  Current Outpatient Medications:    nirmatrelvir/ritonavir (PAXLOVID) 20 x 150 MG & 10 x 100MG  TABS, Take 3 tablets by mouth 2 (two) times daily for 5 days. (Take nirmatrelvir  150 mg two tablets twice daily for 5 days and ritonavir 100 mg one tablet twice daily for 5 days) Patient GFR is 60, Disp: 30 tablet, Rfl: 0   aspirin EC 81 MG tablet, Take 81 mg by mouth at bedtime. Swallow whole., Disp: , Rfl:    Azelastine HCl 137 MCG/SPRAY SOLN, PLACE 2 SPRAYS INTO BOTH NOSTRILS 2 (TWO) TIMES DAILY, Disp: 30 mL, Rfl: 11   B Complex-Biotin-FA (BIG 100, BIOTIN, PO), Take 300 mg by mouth at bedtime., Disp: , Rfl:    bisacodyl (DULCOLAX) 5 MG EC tablet, Take 5 mg by mouth daily as needed for moderate constipation., Disp: , Rfl:    cetirizine (ZYRTEC) 10 MG tablet, Take 10 mg by mouth daily., Disp: , Rfl:    clindamycin (CLEOCIN T) 1 % external solution, Apply to scalp once or twice a day prn bumps, Disp: 30 mL, Rfl:  2   Continuous Glucose Sensor (FREESTYLE LIBRE 3 SENSOR) MISC, PLACE 1 SENSOR ON THE SKIN EVERY 14 DAYS. USE TO CHECK GLUCOSE CONTINUOUSLY, Disp: 6 each, Rfl: 1   cyanocobalamin (VITAMIN B12) 1000 MCG/ML injection, INJECT 1 ML (1,000 MCG TOTAL) INTO THE MUSCLE EVERY 30 (THIRTY) DAYS. FOR B12 VITAMIN, Disp: 3 mL, Rfl: 3   desipramine (NORPRAMIN) 25 MG tablet, 2  qam, Disp: 60 tablet, Rfl: 6   diphenhydrAMINE (BENADRYL) 2 % cream, Apply topically 3 (three) times daily as needed for itching., Disp: , Rfl:    Dulaglutide (TRULICITY) 3 MG/0.5ML SOPN, Inject 3 mg into the skin once a week., Disp: 2 mL, Rfl: 1   DULoxetine (CYMBALTA) 20 MG capsule, TAKE 2 CAPSULES BY MOUTH EVERY DAY, Disp: 180 capsule, Rfl: 2   estradiol (ESTRACE) 0.1 MG/GM vaginal cream, Estrogen Cream Instruction Discard applicator Apply pea sized amount to tip of finger to urethra before bed. Wash hands well after application. Use Monday, Wednesday and Friday, Disp: 42.5 g, Rfl: 11   Evolocumab (REPATHA SURECLICK) 140 MG/ML SOAJ, Inject 140 mg into the skin every 14 (fourteen) days., Disp: 2 mL, Rfl: 5   fenofibrate (TRICOR) 145 MG tablet, Take 1 tablet (145 mg total) by mouth daily. For cholesterol. Please call [phone] for an appointment., Disp: 90 tablet, Rfl: 3   hydrOXYzine (VISTARIL) 50 MG capsule, Take 2 capsules (100 mg total) by mouth at bedtime. TAKE 1 CAPSLE BY MOUTH AT NOON, 1 AT 6 PM, AND 1 AS NEEDED, Disp: 24 capsule, Rfl: 0   ibuprofen (ADVIL) 200 MG tablet, Take 800 mg by mouth 2 (two) times daily., Disp: , Rfl:    ketoconazole (NIZORAL) 2 % shampoo, Apply 1 Application topically as directed. Wash from waist up 3 times a week for 2 months, then decrease to 1 time a month, let sit 5 minutes and rinse off, Disp: 120 mL, Rfl: 6   lamoTRIgine (LAMICTAL) 150 MG tablet, Take 2 tablets (300 mg total) by mouth daily at bedtime., Disp: 16 tablet, Rfl: 0   levalbuterol (XOPENEX HFA) 45 MCG/ACT inhaler, Inhale 2 puffs into the  lungs every 4 (four) hours as needed for wheezing., Disp: , Rfl:    Lysine 500 MG TABS, Take 1,000 mg by mouth daily., Disp: , Rfl:    metFORMIN (GLUCOPHAGE) 500 MG tablet, Take 2 tabs by mouth every morning and 1 tab in the evening, Disp: 270 tablet, Rfl: 3   methocarbamol (ROBAXIN) 500 MG tablet, TAKE 1-2 TABLETS (500-1,000 MG TOTAL) BY MOUTH AT BEDTIME AS NEEDED FOR MUSCLE SPASMS., Disp: 180 tablet, Rfl: 0  metoprolol tartrate (LOPRESSOR) 50 MG tablet, Take 1 tablet (50 mg total) by mouth 2 (two) times daily., Disp: 180 tablet, Rfl: 3   montelukast (SINGULAIR) 10 MG tablet, TAKE 1 TABLET BY MOUTH EVERYDAY AT BEDTIME, Disp: 90 tablet, Rfl: 1   mupirocin ointment (BACTROBAN) 2 %, Apply 1 Application topically daily. Qd to wound on finger, Disp: 22 g, Rfl: 1   neomycin-bacitracin-polymyxin (NEOSPORIN) [phone] ointment, Apply 1 application topically 4 (four) times daily as needed (for cut/scrapes.)., Disp: , Rfl:    NEOMYCIN-POLYMYXIN-HYDROCORTISONE (CORTISPORIN) 1 % SOLN OTIC solution, Apply 1-2 drops to toe BID after soaking, Disp: 10 mL, Rfl: 1   NON FORMULARY, [employer] women's probiotics, Disp: , Rfl:    pantoprazole (PROTONIX) 40 MG tablet, TAKE 1 TABLET (40 MG TOTAL) BY MOUTH 2 (TWO) TIMES DAILY BEFORE A MEAL. FOR HEARTBURN, Disp: 180 tablet, Rfl: 3   polyvinyl alcohol (LIQUIFILM TEARS) 1.4 % ophthalmic solution, Place 2 drops into both eyes at bedtime., Disp: , Rfl:    predniSONE (DELTASONE) 5 MG tablet, Take 1 tablet (5 mg total) by mouth daily with breakfast. Patient has written instruction sheet for two week taper., Disp: 100 tablet, Rfl: 0   Probiotic Product (PROBIOTIC BLEND PO), Take by mouth., Disp: , Rfl:    Ruxolitinib Phosphate (OPZELURA) 1.5 % CREA, Apply to aa's rash QD PRN., Disp: 60 g, Rfl: 3   STIOLTO RESPIMAT 2.5-2.5 MCG/ACT AERS, Inhale 2 puffs into the lungs at bedtime., Disp: , Rfl:    temazepam (RESTORIL) 30 MG capsule, TAKE ONE TO TWO CAPSULES BY MOUTH NIGHTLY AT  BEDTIME, Disp: 30 capsule, Rfl: 0   traZODone (DESYREL) 50 MG tablet, Take 2 tablets (100 mg total) by mouth at bedtime., Disp: 60 tablet, Rfl: 7   valACYclovir (VALTREX) 1000 MG tablet, Take 2 tablets by mouth twice daily for 1 day as needed for herpes outbreak., Disp: 12 tablet, Rfl: 0   Vitamin D3 (VITAMIN D) 25 MCG tablet, Take 3,000 Units by mouth daily., Disp: , Rfl:   Observations/Objective: Patient is well-developed, well-nourished in no acute distress.  Resting comfortably at home.  Head is normocephalic, atraumatic.  No labored breathing.  Speech is clear and coherent with logical content.  Patient is alert and oriented at baseline.    Assessment and Plan: 1. Positive self-administered antigen test for COVID-19 - nirmatrelvir/ritonavir (PAXLOVID) 20 x 150 MG & 10 x 100MG  TABS; Take 3 tablets by mouth 2 (two) times daily for 5 days. (Take nirmatrelvir 150 mg two tablets twice daily for 5 days and ritonavir 100 mg one tablet twice daily for 5 days) Patient GFR is 60  Dispense: 30 tablet; Refill: 0   Please keep well-hydrated and get plenty of rest. Start a saline nasal rinse to flush out your nasal passages. You can use plain Mucinex to help thin congestion. If you have a humidifier, you can use this daily as needed.    You are to wear a mask for 5 days from onset of your symptoms.  After day 5, if you have had no fever and you are feeling better with NO symptoms, you can end masking. Keep in mind you can be contagious 10 days from the onset of symptoms  After day 5 if you have a fever or are having significant symptoms, please wear your mask for full 10 days.   If you note any worsening of symptoms, any significant shortness of breath or any chest pain, please seek ER evaluation ASAP.  Please  do not delay care!    If you note any worsening of symptoms, any significant shortness of breath or any chest pain, please seek ER evaluation ASAP.  Please do not delay care!   Follow Up  Instructions: I discussed the assessment and treatment plan with the patient. The patient was provided an opportunity to ask questions and all were answered. The patient agreed with the plan and demonstrated an understanding of the instructions.  A copy of instructions were sent to the patient via MyChart unless otherwise noted below.     The patient was advised to call back or seek an in-person evaluation if the symptoms worsen or if the condition fails to improve as anticipated.  Time:  I spent 15 minutes with the patient via telehealth technology discussing the above problems/concerns.    Emily Moon

## 2023-07-05 ENCOUNTER — Other Ambulatory Visit (HOSPITAL_COMMUNITY): Payer: Self-pay

## 2023-07-05 DIAGNOSIS — F310 Bipolar disorder, current episode hypomanic: Secondary | ICD-10-CM

## 2023-07-05 MED ORDER — TEMAZEPAM 30 MG PO CAPS: ORAL_CAPSULE | ORAL | 0 refills | Status: DC

## 2023-07-05 MED ORDER — DESIPRAMINE HCL 25 MG PO TABS: ORAL_TABLET | ORAL | 0 refills | Status: DC

## 2023-07-05 MED ORDER — LAMOTRIGINE 150 MG PO TABS: ORAL_TABLET | ORAL | 0 refills | Status: DC

## 2023-07-05 MED ORDER — DULOXETINE HCL 20 MG PO CPEP: 40.0000 mg | ORAL_CAPSULE | Freq: Every day | ORAL | 0 refills | Status: DC

## 2023-07-05 MED ORDER — TRAZODONE HCL 50 MG PO TABS: 100.0000 mg | ORAL_TABLET | Freq: Every day | ORAL | 0 refills | Status: DC

## 2023-07-05 MED ORDER — HYDROXYZINE PAMOATE 50 MG PO CAPS: 100.0000 mg | ORAL_CAPSULE | Freq: Every day | ORAL | 0 refills | Status: DC

## 2023-07-05 NOTE — Telephone Encounter (Signed)
Spoke to pt. She did a virtual visit yesterday. Was prescribed anti-viral. Advised her to let us know if she is not improving.

## 2023-07-06 ENCOUNTER — Telehealth: Payer: Self-pay | Admitting: Internal Medicine

## 2023-07-06 ENCOUNTER — Ambulatory Visit (HOSPITAL_COMMUNITY): Payer: PPO | Admitting: Psychiatry

## 2023-07-06 NOTE — Telephone Encounter (Signed)
Called and spoke to patient.  She stated that she tested positive for covid on Saturday. Sx started that day.  C/o Increased SOB, dry cough, nasal congestion, postnasal drip, temp of 100.1, body aches and headaches.  She reports of rib pain under breast line and into back, but feels this is related to cough.  She does not wear supplemental oxygen. Spo2 94% during our conversation S\he has not been using stiolto, as it took so long for her medication to come in. She stated that she got out of habit of taking it.  She has been using albuterol HFA once daily.  She has a nebulizer but does not have medication for it. I verified with patient that she does live alone incase neb solution is prescribed.    Dr. Belia Heman, please advise. Thanks

## 2023-07-06 NOTE — Telephone Encounter (Signed)
Patient is aware of below message/recommendations and voiced her understanding.  Nothing further needed.  

## 2023-07-06 NOTE — Telephone Encounter (Signed)
Spoke to patient and relayed below message. She stated that she can not tolerate prednisone, as it increases her blood sugar. She stated that she is currently taking xopenex HFA without without any side effects.   Dr. Belia Heman, please advise. Thanks

## 2023-07-06 NOTE — Telephone Encounter (Signed)
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 Consider removing the following QT Prolonging medication(s). For more information, providers can review medications that prolong the QT interval at [email or web address] (registration required).   When attempting to order xopenex neb, I received above message. Dr. Belia Heman, please advise. thanks

## 2023-07-06 NOTE — Telephone Encounter (Signed)
Pt. Calling tested positive for covid on Sat. And wants med advise if she can get script for her symp.

## 2023-07-07 ENCOUNTER — Telehealth: Payer: Self-pay

## 2023-07-07 ENCOUNTER — Ambulatory Visit: Payer: PPO | Admitting: Podiatry

## 2023-07-07 NOTE — Telephone Encounter (Signed)
Patient called regarding suture removal appt scheduled for tomorrow. She currently has COVID.  Can this be pushed to next week or encourage her to use disposable suture removal kit?

## 2023-07-08 ENCOUNTER — Telehealth: Payer: Self-pay

## 2023-07-08 ENCOUNTER — Ambulatory Visit: Payer: PPO | Admitting: Dermatology

## 2023-07-08 ENCOUNTER — Other Ambulatory Visit (HOSPITAL_COMMUNITY): Payer: Self-pay

## 2023-07-08 NOTE — Telephone Encounter (Signed)
Pharmacy Patient Advocate Encounter   Received notification from CoverMyMeds that prior authorization for Trulicity 3mg /0.59ml is required/requested.   Insurance verification completed.   The patient is insured through [employer]/ [employer] .   Per test claim: PA required; PA submitted to [employer] via Fax Key/confirmation #/EOC ** Status is pending   Fax # [phone]

## 2023-07-08 NOTE — Telephone Encounter (Signed)
Unable to leave a voicemail, voicemail box not set up.

## 2023-07-08 NOTE — Telephone Encounter (Signed)
-----   Message from Armida Sans sent at 07/07/2023  5:37 PM EDT ----- Diagnosis Skin , right forearm GUTTATE PSORIASIS  Rash most consistent with Psoriasis Any history of past psoriasis diagnosis?  Or family history? Pt currently has COVID - we can wait for another 10 days to remove suture and discuss results and start treatment Or May have family member pick up disposable suture removal kit if they think they can do that.

## 2023-07-08 NOTE — Telephone Encounter (Signed)
Left message for patient to return call. aw 

## 2023-07-09 ENCOUNTER — Ambulatory Visit: Payer: PPO | Admitting: Physician Assistant

## 2023-07-11 ENCOUNTER — Encounter: Payer: Self-pay | Admitting: Dermatology

## 2023-07-12 ENCOUNTER — Other Ambulatory Visit: Payer: Self-pay

## 2023-07-12 ENCOUNTER — Other Ambulatory Visit (HOSPITAL_COMMUNITY): Payer: Self-pay

## 2023-07-12 MED ORDER — REPATHA SURECLICK 140 MG/ML ~~LOC~~ SOAJ: 140.0000 mg | SUBCUTANEOUS | 2 refills | Status: DC

## 2023-07-12 NOTE — Telephone Encounter (Signed)
Requested Prescriptions   Signed Prescriptions Disp Refills   Evolocumab (REPATHA SURECLICK) 140 MG/ML SOAJ 2 mL 2    Sig: Inject 140 mg into the skin every 14 (fourteen) days. PLEASE CALL OFFICE TO SCHEDULE APPOINTMENT PRIOR TO NEXT REFILL    Authorizing Provider: Debbe Odea    Ordering User: Guerry Minors

## 2023-07-12 NOTE — Telephone Encounter (Signed)
Pharmacy Patient Advocate Encounter  Received notification from [employer] that Prior Authorization for Trulicity 3mg /0.59ml has been APPROVED from 07/08/23 to 07/07/24   PA #/Case ID/Reference #: [id number]  Placed a call to [facility] pharmacy to notify of the approval. Copay is $235.52 per the pharmacist.   Approval letter indexed to media tab.

## 2023-07-12 NOTE — Telephone Encounter (Signed)
Patient was advised of new appt date and time Thursday 07/08/23. aw

## 2023-07-12 NOTE — Telephone Encounter (Signed)
last visit with Dr. Simona Huh on 12/01/22 with plan to f/u in 6 months. Please schedule f/u appt.  thanks

## 2023-07-13 ENCOUNTER — Encounter: Payer: PPO | Admitting: Pharmacist

## 2023-07-14 ENCOUNTER — Ambulatory Visit: Payer: PPO | Admitting: Dermatology

## 2023-07-14 ENCOUNTER — Ambulatory Visit (INDEPENDENT_AMBULATORY_CARE_PROVIDER_SITE_OTHER): Payer: PPO | Admitting: Dermatology

## 2023-07-14 VITALS — BP 105/84

## 2023-07-14 DIAGNOSIS — L853 Xerosis cutis: Secondary | ICD-10-CM

## 2023-07-14 DIAGNOSIS — L409 Psoriasis, unspecified: Secondary | ICD-10-CM

## 2023-07-14 DIAGNOSIS — Z79899 Other long term (current) drug therapy: Secondary | ICD-10-CM

## 2023-07-14 DIAGNOSIS — Z7189 Other specified counseling: Secondary | ICD-10-CM

## 2023-07-14 MED ORDER — ZORYVE 0.3 % EX CREA: 1.0000 "application " | TOPICAL_CREAM | Freq: Every day | CUTANEOUS | 4 refills | Status: AC

## 2023-07-14 NOTE — Telephone Encounter (Signed)
Scheduled 09/18

## 2023-07-14 NOTE — Patient Instructions (Addendum)
Start Zoryve cream once daily to areas of psoriasis on arms, legs   Due to recent changes in healthcare laws, you may see results of your pathology and/or laboratory studies on MyChart before the doctors have had a chance to review them. We understand that in some cases there may be results that are confusing or concerning to you. Please understand that not all results are received at the same time and often the doctors may need to interpret multiple results in order to provide you with the best plan of care or course of treatment. Therefore, we ask that you please give [place] 2 business days to thoroughly review all your results before contacting the office for clarification. Should we see a critical lab result, you will be contacted sooner.   If You Need Anything After Your Visit  If you have any questions or concerns for your doctor, please call our main line at [phone] and press option 4 to reach your doctor's medical assistant. If no one answers, please leave a voicemail as directed and we will return your call as soon as possible. Messages left after 4 pm will be answered the following business day.   You may also send [place] a message via MyChart. We typically respond to MyChart messages within 1-2 business days.  For prescription refills, please ask your pharmacy to contact our office. Our fax number is [phone].  If you have an urgent issue when the clinic is closed that cannot wait until the next business day, you can page your doctor at the number below.    Please note that while we do our best to be available for urgent issues outside of office hours, we are not available 24/7.   If you have an urgent issue and are unable to reach [place], you may choose to seek medical care at your doctor's office, retail clinic, urgent care center, or emergency room.  If you have a medical emergency, please immediately call 911 or go to the emergency department.  Pager Numbers  - Dr. Gwen Pounds:  [phone]  - Dr. Roseanne Reno: [phone]  - Dr. Katrinka Blazing: [phone]   In the event of inclement weather, please call our main line at [phone] for an update on the status of any delays or closures.  Dermatology Medication Tips: Please keep the boxes that topical medications come in in order to help keep track of the instructions about where and how to use these. Pharmacies typically print the medication instructions only on the boxes and not directly on the medication tubes.   If your medication is too expensive, please contact our office at [phone] option 4 or send [place] a message through MyChart.   We are unable to tell what your co-pay for medications will be in advance as this is different depending on your insurance coverage. However, we may be able to find a substitute medication at lower cost or fill out paperwork to get insurance to cover a needed medication.   If a prior authorization is required to get your medication covered by your insurance company, please allow [place] 1-2 business days to complete this process.  Drug prices often vary depending on where the prescription is filled and some pharmacies may offer cheaper prices.  The website www.goodrx.com contains coupons for medications through different pharmacies. The prices here do not account for what the cost may be with help from insurance (it may be cheaper with your insurance), but the website can give you the price if you did not  use any insurance.  - You can print the associated coupon and take it with your prescription to the pharmacy.  - You may also stop by our office during regular business hours and pick up a GoodRx coupon card.  - If you need your prescription sent electronically to a different pharmacy, notify our office through [facility] or by phone at [phone] option 4.     Si Usted Necesita Algo Despus de Su Visita  Tambin puede enviarnos un mensaje a travs de Clinical cytogeneticist. Por lo general  respondemos a los mensajes de MyChart en el transcurso de 1 a 2 das hbiles.  Para renovar recetas, por favor pida a su farmacia que se ponga en contacto con nuestra oficina. Annie Sable de fax es [place] [phone].  Si tiene un asunto urgente cuando la clnica est cerrada y que no puede esperar hasta el siguiente da hbil, puede llamar/localizar a su doctor(a) al nmero que aparece a continuacin.   Por favor, tenga en cuenta que aunque hacemos todo lo posible para estar disponibles para asuntos urgentes fuera del horario de [place], no estamos disponibles las 24 horas del da, los 7 [address]  [address] de la [place].   Si tiene un problema urgente y no puede comunicarse con nosotros, puede optar por buscar atencin mdica  en el consultorio de su doctor(a), en una clnica privada, en un centro de atencin urgente o en una sala de emergencias.  Si tiene Engineer, drilling, por favor llame inmediatamente al 911 o vaya a la sala de emergencias.  Nmeros de bper  - Dr. Gwen Pounds: [phone]  - Dra. Roseanne Reno: [phone]  - Dr. Katrinka Blazing: [phone]   En caso de inclemencias del tiempo, por favor llame a Lacy Duverney principal al [phone] para una actualizacin sobre el [place] de cualquier retraso o cierre.  Consejos para la medicacin en dermatologa: Por favor, guarde las cajas en las que vienen los medicamentos de uso tpico para ayudarle a seguir las instrucciones sobre dnde y cmo usarlos. Las farmacias generalmente imprimen las instrucciones del medicamento slo en las cajas y no directamente en los tubos del [place].   Si su medicamento es muy caro, por favor, pngase en contacto con Rolm Gala llamando al [phone] y presione la opcin 4 o envenos un mensaje a travs de Clinical cytogeneticist.   No podemos decirle cul ser su copago por los medicamentos por adelantado ya que esto es diferente dependiendo de la cobertura de su seguro. Sin embargo, es posible que podamos encontrar un  medicamento sustituto a Audiological scientist un formulario para que el seguro cubra el medicamento que se considera necesario.   Si se requiere una autorizacin previa para que su compaa de seguros [place] su medicamento, por favor permtanos de 1 a 2 das hbiles para completar [address]  Los precios de los medicamentos varan con frecuencia dependiendo del Environmental consultant de dnde se surte la receta y alguna farmacias pueden ofrecer precios ms baratos.  El sitio web www.goodrx.com tiene cupones para medicamentos de Health and safety inspector. Los precios aqu no tienen en cuenta lo que podra costar con la ayuda del seguro (puede ser ms barato con su seguro), pero el sitio web puede darle el precio si no utiliz Tourist information centre manager.  - Puede imprimir el cupn correspondiente y llevarlo con su receta a la farmacia.  - Tambin puede pasar por nuestra oficina durante el horario de atencin regular y Education officer, museum una tarjeta de cupones de GoodRx.  - Si necesita que su receta se enve  electrnicamente a [place] diferente, informe a nuestra oficina a travs de MyChart de [place] o por telfono llamando al [phone] y presione la opcin 4.

## 2023-07-14 NOTE — Progress Notes (Unsigned)
   Follow-Up Visit   Subjective  Emily Moon is a 62 y.o. female who presents for the following: Psoriasis bx proven, 2 wk f/u, suture removal and discuss bx results, Opzelura cr prn The patient has spots, moles and lesions to be evaluated, some may be new or changing and the patient may have concern these could be cancer.   The following portions of the chart were reviewed this encounter and updated as appropriate: medications, allergies, medical history  Review of Systems:  No other skin or systemic complaints except as noted in HPI or Assessment and Plan.  Objective  Well appearing patient in no apparent distress; mood and affect are within normal limits.   A focused examination was performed of the following areas: R arm  Relevant exam findings are noted in the Assessment and Plan.    Assessment & Plan   Xerosis - diffuse xerotic patches - recommend gentle, hydrating skin care - gentle skin care handout given  - Recommend Amlactin, samples given  PSORIASIS bx proven Arms, legs trunk 1 % BSA Exam: healing bx site  Chronic and persistent condition with duration or expected duration over one year. Condition is symptomatic/ bothersome to patient. Not currently at goal.   Counseling on psoriasis and coordination of care  psoriasis is a chronic non-curable, but treatable genetic/hereditary disease that may have other systemic features affecting other organ systems such as joints (Psoriatic Arthritis). It is associated with an increased risk of inflammatory bowel disease, heart disease, non-alcoholic fatty liver disease, and depression.  Treatments include light and laser treatments; topical medications; and systemic medications including oral and injectables.   Patient does have some joint pain  Treatment Plan: D/c Opzelura cream Start Zoryve cream qd aa areas arms, legs, trunk, samples x 4, lot NBBD, exp 01/2024 Referral to Dr. Allena Katz for joint pain  Encounter for  Removal of Sutures - Incision site at the R forearm is clean, dry and intact - Wound cleansed, sutures removed, wound cleansed and steri strips applied.  - Discussed pathology results showing Psoriasis  - Patient advised to keep steri-strips dry until they fall off. - Scars remodel for a full year. - Once steri-strips fall off, patient can apply over-the-counter silicone scar cream each night to help with scar remodeling if desired. - Patient advised to call with any concerns or if they notice any new or changing lesions.    Psoriasis  Related Procedures Ambulatory referral to Rheumatology    Return in about 6 months (around 01/14/2024) for Psoriasis f/u.  I, Ardis Rowan, RMA, am acting as scribe for Armida Sans, MD .   Documentation: I have reviewed the above documentation for accuracy and completeness, and I agree with the above.  Armida Sans, MD

## 2023-07-15 ENCOUNTER — Encounter: Payer: Self-pay | Admitting: Dermatology

## 2023-07-15 ENCOUNTER — Ambulatory Visit: Payer: PPO | Admitting: Physician Assistant

## 2023-07-16 ENCOUNTER — Ambulatory Visit: Payer: PPO | Admitting: Physician Assistant

## 2023-07-21 ENCOUNTER — Encounter: Payer: Self-pay | Admitting: Podiatry

## 2023-07-21 ENCOUNTER — Ambulatory Visit: Payer: PPO | Admitting: Podiatry

## 2023-07-21 DIAGNOSIS — L600 Ingrowing nail: Secondary | ICD-10-CM

## 2023-07-21 DIAGNOSIS — L03031 Cellulitis of right toe: Secondary | ICD-10-CM

## 2023-07-21 NOTE — Progress Notes (Signed)
She presents today for follow-up paronychia fourth toe right foot.  States that she was unable to soak it twice a day and she says it really just does not look good.  Objective: Vital signs stable oriented x 3.  Fourth digit of the right foot does demonstrate what appears to be a small pyogenic granuloma to the distal lateral aspect that is mildly erythematous.  It is mildly tender on palpation.  Assessment: Paronychia granuloma fourth right.  Plan: Chemical matricectomy was performed when she was here previously I think this may have been a drainage point for that matrixectomy currently it appears to have a granuloma that we will resect.  I performed that today after local anesthetic was administered resected the granuloma in total and clean the area.  She will go back to soaking twice daily follow-up with her in about 2 weeks at which time we will do a diabetic foot exam.

## 2023-07-23 ENCOUNTER — Encounter: Payer: Self-pay | Admitting: Physician Assistant

## 2023-07-23 ENCOUNTER — Ambulatory Visit (INDEPENDENT_AMBULATORY_CARE_PROVIDER_SITE_OTHER): Payer: PPO | Admitting: Physician Assistant

## 2023-07-23 DIAGNOSIS — N3281 Overactive bladder: Secondary | ICD-10-CM | POA: Diagnosis not present

## 2023-07-23 NOTE — Patient Instructions (Signed)

## 2023-07-23 NOTE — Progress Notes (Addendum)
PTNS  Session # 4 (lifetime #16)  Health & Social Factors: pos for COVID Caffeine: 1/2 cup Alcohol: none Daytime voids #per day: 25 Night-time voids #per night: 4-5 Urgency: strong Incontinence Episodes #per day: none Ankle used: right Treatment Setting: 4 Feeling/ Response: both bottom of toe and foot Comments: none  Performed By: Cira Rue L RMA  Follow Up: F/UP next week as scheduled

## 2023-07-27 ENCOUNTER — Telehealth (INDEPENDENT_AMBULATORY_CARE_PROVIDER_SITE_OTHER): Payer: PPO | Admitting: Psychology

## 2023-07-30 ENCOUNTER — Ambulatory Visit (INDEPENDENT_AMBULATORY_CARE_PROVIDER_SITE_OTHER): Payer: PPO | Admitting: Physician Assistant

## 2023-07-30 DIAGNOSIS — N3281 Overactive bladder: Secondary | ICD-10-CM

## 2023-07-30 NOTE — Progress Notes (Signed)
PTNS  Session # 5 (lifetime #17)  Health & Social Factors: fatigue (recent COVID infection) Caffeine: 1 Alcohol: 0 Daytime voids #per day: 25-30 Night-time voids #per night: 1 Urgency: mild to strong Incontinence Episodes #per day: <1 Ankle used: right Treatment Setting: 2 Feeling/ Response: both Comments: Patient tolerated well.  Performed By: Carman Ching, PA-C   Follow Up: 1 week

## 2023-07-30 NOTE — Patient Instructions (Signed)

## 2023-08-01 NOTE — Progress Notes (Unsigned)
Cardiology Office Note    Date:  08/04/2023   ID:  INIS GRANADO, DOB [birth year], MRN [id number]  PCP:  Karie Schwalbe, MD  Cardiologist:  Debbe Odea, MD  Electrophysiologist:  None   Chief Complaint: Follow up  History of Present Illness:   Emily Moon is a 62 y.o. female with history of inappropriate sinus tachycardia, multiple sclerosis, DM2, HLD with statin intolerance on PCSK9i, bipolar, asthma, obstructive pulmonary disease, prior tobacco use x15 years, fibromyalgia, obesity, OSA on CPAP, DDD, congenital hip dysplasia, and GERD who presents for follow-up of sinus tachycardia.   Prior calcium score of 0 in 02/2020.  She was diagnosed with inappropriate sinus tachycardia in 05/2020 and started on metoprolol with improvement in symptoms.  She was evaluated in 04/2021 for preoperative cardiac risk ratification for planned upcoming total hip arthroplasty.  She was under increased stress and in the setting did note an increase in palpitations.  Given underlying shortness of breath she underwent echo on 06/05/2021 which showed an EF of 60 to 65%, no regional wall motion abnormalities, grade 1 diastolic dysfunction, normal RV systolic function and ventricular cavity size, no significant valvular abnormalities, and a mildly dilated left atrium.  Toprol was titrated to 75 mg daily in 07/2021.  However, in follow-up in the next month she reported intolerance to titrated Toprol secondary to constipation and elevated heart rates.  At that visit, Toprol was discontinued and she was transitioned to Lopressor 50 mg twice daily.  Subsequent Zio patch x 2 in 08/2021 showed: Monitor number 1) average heart rate 93 bpm with a range of 78 to 123 bpm with a predominant rhythm of sinus, rare atrial and ventricular ectopy.  Monitor number 2) predominant rhythm of sinus with an average rate of 90 bpm with a range of 70 to 207 bpm, 7 episodes of SVT with the fastest interval lasting 4 beats with a  maximum rate of 207 bpm and the longest interval lasting 5 beats.  SVT was detected with symptomatic patient events, rare atrial and ventricular ectopy were also identified.  She was last seen in the office in 11/2022 and noted continued control of palpitations with metoprolol.  She had been more sedentary.  No changes were indicated at that time.  She noted an increase in palpitations in the setting of COVID illness in 06/2023.  She comes in doing well from a cardiac perspective and is without symptoms of angina or cardiac decompensation.  She does note an increase in palpitations/heart rate if she forgets to take a dose of metoprolol.  She also notes an uptick in heart rates to the upper 90s to low 100s bpm leading up to her next dose of metoprolol.  No presyncope or syncope.  Gets most meals out of the house, does not cook.  No regular exercise.   Labs independently reviewed: 05/2023 - Hgb 12.9, PLT 436, potassium 4.0, BUN 25, serum creatinine 0.97, albumin 4.3, AST/ALT normal 03/2023 - TC 122, TG 155, HDL 39, LDL 56, TSH 4.860, A1c 6.6  Past Medical History:  Diagnosis Date   Acute pyelonephritis 12/25/2021   Arthritis    osteo   Asthma    Bipolar disorder (HCC) 05/21/2014   Cataracts, bilateral    Cervical radiculitis    Chest pain    Chronic pain syndrome    COPD (chronic obstructive pulmonary disease) (HCC)    DDD (degenerative disc disease), cervical    also back   Depression    Diabetes  mellitus without complication (HCC)    Dizziness    Positional   Edema    feet/legs   Epigastric pain    Fibromyalgia syndrome    Fungal infection    Finger nails   GERD (gastroesophageal reflux disease)    Gout    Heart palpitations    Hip dysplasia, congenital 09/15/2013   History of kidney stones    Hx of sepsis    Hypercholesterolemia    IDA (iron deficiency anemia)    Multiple sclerosis (HCC)    weakness   Osteoporosis    osteoarthritis   Pneumonia    PONV (postoperative nausea  and vomiting)    no problem after cataract surgery   Prediabetes 07/22/2018   Psoriasis    Sciatica    Seasonal allergies    Shortness of breath dyspnea    wheezing   Sleep apnea 2012   sleep study / slight, no interventions   Urinary frequency    Vitamin B 12 deficiency    Weight gain 06/21/2014    Past Surgical History:  Procedure Laterality Date   CATARACT EXTRACTION W/PHACO Left 05/21/2015   Procedure: CATARACT EXTRACTION PHACO AND INTRAOCULAR LENS PLACEMENT (IOC);  Surgeon: Galen Manila, MD;  Location: [facility];  Service: Ophthalmology;  Laterality: Left;  [place] 00:35 AP% 22.9 CDE 8.11 fluid pack lot #[id number]   CATARACT EXTRACTION W/PHACO Right 06/04/2015   Procedure: CATARACT EXTRACTION PHACO AND INTRAOCULAR LENS PLACEMENT (IOC);  Surgeon: Galen Manila, MD;  Location: [facility];  Service: Ophthalmology;  Laterality: Right;  US:00:48 AP%: 10.5 CDE:5.08 Fluid lot #[id number]   CYSTOSCOPY/URETEROSCOPY/HOLMIUM LASER/STENT PLACEMENT Bilateral 09/22/2016   Procedure: CYSTOSCOPY/URETEROSCOPY/HOLMIUM LASER/STENT PLACEMENT;  Surgeon: Vanna Scotland, MD;  Location: [facility];  Service: Urology;  Laterality: Bilateral;   EYE SURGERY  2015   tissue biopsy   FOOT SURGERY  2015   JOINT REPLACEMENT Left 2013   hip replacement   LITHOTRIPSY     PTOSIS REPAIR Bilateral 02/18/2016   Procedure: BILATERAL PTOSIS REPAIR UPPER EYELIDS;  Surgeon: Imagene Riches, MD;  Location: [facility];  Service: Ophthalmology;  Laterality: Bilateral;  LEAVE PT EARLY AM   thumb surgery Right    TONSILLECTOMY  1973   TOTAL HIP ARTHROPLASTY Right 07/02/2021   Procedure: TOTAL HIP ARTHROPLASTY ANTERIOR APPROACH;  Surgeon: Ollen Gross, MD;  Location: [facility];  Service: Orthopedics;  Laterality: Right;    Current Medications: Current Meds  Medication Sig   aspirin EC 81 MG tablet Take 81 mg by mouth at bedtime. Swallow whole.   Azelastine HCl 137 MCG/SPRAY SOLN PLACE 2 SPRAYS INTO BOTH NOSTRILS 2 (TWO)  TIMES DAILY   B Complex-Biotin-FA (BIG 100, BIOTIN, PO) Take 300 mg by mouth at bedtime.   bisacodyl (DULCOLAX) 5 MG EC tablet Take 5 mg by mouth daily as needed for moderate constipation.   cetirizine (ZYRTEC) 10 MG tablet Take 10 mg by mouth daily.   clindamycin (CLEOCIN T) 1 % external solution Apply to scalp once or twice a day prn bumps   Continuous Glucose Sensor (FREESTYLE LIBRE 3 SENSOR) MISC PLACE 1 SENSOR ON THE SKIN EVERY 14 DAYS. USE TO CHECK GLUCOSE CONTINUOUSLY   cyanocobalamin (VITAMIN B12) 1000 MCG/ML injection INJECT 1 ML (1,000 MCG TOTAL) INTO THE MUSCLE EVERY 30 (THIRTY) DAYS. FOR B12 VITAMIN   desipramine (NORPRAMIN) 25 MG tablet 2  qam   diphenhydrAMINE (BENADRYL) 2 % cream Apply topically 3 (three) times daily as needed for itching.   Dulaglutide (TRULICITY) 3 MG/0.5ML SOPN  Inject 3 mg into the skin once a week.   DULoxetine (CYMBALTA) 20 MG capsule Take 2 capsules (40 mg total) by mouth daily.   estradiol (ESTRACE) 0.1 MG/GM vaginal cream Estrogen Cream Instruction Discard applicator Apply pea sized amount to tip of finger to urethra before bed. Wash hands well after application. Use Monday, Wednesday and Friday   hydrOXYzine (VISTARIL) 50 MG capsule Take 2 capsules (100 mg total) by mouth at bedtime. TAKE 1 CAPSLE BY MOUTH AT NOON, 1 AT 6 PM, AND 1 AS NEEDED   ibuprofen (ADVIL) 200 MG tablet Take 800 mg by mouth 2 (two) times daily.   ketoconazole (NIZORAL) 2 % shampoo Apply 1 Application topically as directed. Wash from waist up 3 times a week for 2 months, then decrease to 1 time a month, let sit 5 minutes and rinse off   lamoTRIgine (LAMICTAL) 150 MG tablet Take 2 tablets (300 mg total) by mouth daily at bedtime.   levalbuterol (XOPENEX HFA) 45 MCG/ACT inhaler Inhale 2 puffs into the lungs every 4 (four) hours as needed for wheezing.   Lysine 500 MG TABS Take 1,000 mg by mouth daily.   metFORMIN (GLUCOPHAGE) 500 MG tablet Take 2 tabs by mouth every morning and 1 tab in  the evening   methocarbamol (ROBAXIN) 500 MG tablet TAKE 1-2 TABLETS (500-1,000 MG TOTAL) BY MOUTH AT BEDTIME AS NEEDED FOR MUSCLE SPASMS.   montelukast (SINGULAIR) 10 MG tablet TAKE 1 TABLET BY MOUTH EVERYDAY AT BEDTIME   mupirocin ointment (BACTROBAN) 2 % Apply 1 Application topically daily. Qd to wound on finger   neomycin-bacitracin-polymyxin (NEOSPORIN) [phone] ointment Apply 1 application topically 4 (four) times daily as needed (for cut/scrapes.).   NEOMYCIN-POLYMYXIN-HYDROCORTISONE (CORTISPORIN) 1 % SOLN OTIC solution Apply 1-2 drops to toe BID after soaking   NON FORMULARY Garden of life women's probiotics   pantoprazole (PROTONIX) 40 MG tablet TAKE 1 TABLET (40 MG TOTAL) BY MOUTH 2 (TWO) TIMES DAILY BEFORE A MEAL. FOR HEARTBURN   polyvinyl alcohol (LIQUIFILM TEARS) 1.4 % ophthalmic solution Place 2 drops into both eyes at bedtime.   Probiotic Product (PROBIOTIC BLEND PO) Take by mouth.   Roflumilast (ZORYVE) 0.3 % CREA Apply 1 application  topically daily. qd to aa psoriasis on legs, arms   Ruxolitinib Phosphate (OPZELURA) 1.5 % CREA Apply to aa's rash QD PRN.   STIOLTO RESPIMAT 2.5-2.5 MCG/ACT AERS Inhale 2 puffs into the lungs at bedtime.   temazepam (RESTORIL) 30 MG capsule TAKE ONE TO TWO CAPSULES BY MOUTH NIGHTLY AT BEDTIME   traZODone (DESYREL) 50 MG tablet Take 2 tablets (100 mg total) by mouth at bedtime.   valACYclovir (VALTREX) 1000 MG tablet Take 2 tablets by mouth twice daily for 1 day as needed for herpes outbreak.   Vitamin D3 (VITAMIN D) 25 MCG tablet Take 3,000 Units by mouth daily.   [DISCONTINUED] Evolocumab (REPATHA SURECLICK) 140 MG/ML SOAJ Inject 140 mg into the skin every 14 (fourteen) days. PLEASE CALL OFFICE TO SCHEDULE APPOINTMENT PRIOR TO NEXT REFILL   [DISCONTINUED] fenofibrate (TRICOR) 145 MG tablet Take 1 tablet (145 mg total) by mouth daily. For cholesterol. Please call [phone] for an appointment.   [DISCONTINUED] metoprolol tartrate (LOPRESSOR) 50  MG tablet Take 1 tablet (50 mg total) by mouth 2 (two) times daily.    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   Occupation: Disability  Tobacco Use   Smoking status: Former    Current packs/day: 0.00    Average packs/day: 1 pack/day for 25.0 years (25.0 ttl pk-yrs)    Types: Cigarettes    Start date: 39    Quit date: 2019    Years since quitting: 5.7    Passive exposure: Past   Smokeless tobacco: Never   Tobacco comments:    occasional use  Vaping Use   Vaping status: Former  Substance and Sexual Activity   Alcohol use: No    Alcohol/week: 0.0 standard drinks of alcohol   Drug use: Yes    Types: Methylphenidate, Marijuana    Comment: Delta 8 - once per day   Sexual activity: Never  Other Topics Concern   Not on file  Social History Narrative   Not on file   Social Determinants of Health   Financial Resource Strain: High Risk (07/29/2023)   Overall Financial Resource Strain (CARDIA)    Difficulty of Paying Living Expenses: Hard  Food Insecurity: Food Insecurity Present (07/29/2023)   Hunger Vital Sign    Worried About Running Out of Food in the Last Year: Sometimes true    Ran Out of Food in the Last Year: Sometimes true  Transportation Needs: No Transportation Needs (07/29/2023)   PRAPARE - Administrator, Civil Service (Medical): No    Lack of Transportation (Non-Medical): No  Physical  Activity: Inactive (07/29/2023)   Exercise Vital Sign    Days of Exercise per Week: 0 days    Minutes of Exercise per Session: 20 min  Stress: Stress Concern Present (07/29/2023)   [employer] of Occupational Health - Occupational Stress Questionnaire    Feeling of Stress : Very much  Social Connections: Socially Isolated (07/29/2023)   Social Connection and Isolation Panel [NHANES]    Frequency of Communication with Friends and Family: Once a week    Frequency of Social Gatherings with Friends and Family: Never    Attends Religious Services: Never    Database administrator or Organizations: No    Attends Engineer, structural: Never    Marital Status: Divorced     Family History:  The patient's family history includes Cancer in her father and mother; Diabetes in her mother; Heart disease in her mother; High Cholesterol in her father; Sleep apnea in her mother. There is no history of Kidney disease, Bladder Cancer, Prostate cancer, or Kidney cancer.  ROS:   12-point review of systems is negative unless otherwise noted in the HPI.   EKGs/Labs/Other Studies Reviewed:    Studies reviewed were summarized above. The additional studies were reviewed today:  Zio patch 08/2021: Monitor 1 Patient had a min HR of 78 bpm, max HR of 123 bpm, and avg HR of 93 bpm. Predominant underlying rhythm was Sinus Rhythm. Isolated SVEs were rare (<1.0%), and no SVE Couplets or SVE Triplets were present. Isolated VEs were rare (<1.0%), VE Couplets were  rare (<1.0%), and no VE Triplets were present.    Monitor 2 Patient had a min HR of 70 bpm, max  HR of 207 bpm, and avg HR of 90 bpm. Predominant underlying rhythm was Sinus Rhythm. 7 Supraventricular Tachycardia runs occurred, the run with the fastest interval lasting 4 beats with a max rate of 207 bpm, the  longest lasting 5 beats with an avg rate of 120 bpm. Supraventricular Tachycardia was detected within +/- 45 seconds of symptomatic patient  event(s). Isolated SVEs were rare (<1.0%), SVE Couplets were rare (<1.0%), and SVE Triplets were rare (<1.0%).  Isolated VEs were rare (<1.0%), VE Couplets were rare (<1.0%), and no VE Triplets were present. __________   2D echo 06/05/2021: 1. Left ventricular ejection fraction, by estimation, is 60 to 65%. The  left ventricle has normal function. The left ventricle has no regional  wall motion abnormalities. Left ventricular diastolic parameters are  consistent with Grade I diastolic  dysfunction (impaired relaxation).   2. Right ventricular systolic function is normal. The right ventricular  size is normal. Tricuspid regurgitation signal is inadequate for assessing  PA pressure.   3. Left atrial size was mildly dilated.  __________   Calcium score 03/11/2020: IMPRESSION: Coronary calcium score of 0.   EKG:  EKG is ordered today.  The EKG ordered today demonstrates NSR, 84 bpm, right axis deviation, incomplete RBBB, consistent with prior tracing  Recent Labs: 03/23/2023: TSH 4.860 06/16/2023: ALT 15; BUN 25; Creatinine 0.97; Hemoglobin 12.9; Platelets 436; Potassium 4.0; Sodium 136  Recent Lipid Panel    Component Value Date/Time   CHOL 122 03/23/2023 1049   TRIG 155 (H) 03/23/2023 1049   HDL 39 (L) 03/23/2023 1049   CHOLHDL 4 08/20/2022 1508   VLDL 39.2 08/20/2022 1508   LDLCALC 56 03/23/2023 1049   LDLDIRECT 53.0 08/01/2020 1254    PHYSICAL EXAM:    VS:  BP 96/68 (BP Location: Right Arm, Patient Position: Sitting, Cuff Size: Normal)   Pulse 84   Ht 5\' 9"  (1.753 m)   Wt 235 lb (106.6 kg)   LMP 08/23/2014   SpO2 98%   BMI 34.70 kg/m   BMI: Body mass index is 34.7 kg/m.  Physical Exam Vitals reviewed.  Constitutional:      Appearance: She is well-developed.  HENT:     Head: Normocephalic and atraumatic.  Eyes:     General:        Right eye: No discharge.        Left eye: No discharge.  Cardiovascular:     Rate and Rhythm: Normal rate and regular rhythm.      Heart sounds: Normal heart sounds, S1 normal and S2 normal. Heart sounds not distant. No midsystolic click and no opening snap. No murmur heard.    No friction rub.  Pulmonary:     Effort: Pulmonary effort is normal. No respiratory distress.     Breath sounds: Normal breath sounds. No decreased breath sounds, wheezing or rales.  Chest:     Chest wall: No tenderness.  Abdominal:     General: There is no distension.  Musculoskeletal:     Cervical back: Normal range of motion.  Skin:    General: Skin is warm and dry.     Nails: There is no clubbing.  Neurological:     Mental Status: She is alert and oriented to person, place, and time.  Psychiatric:        Speech: Speech normal.        Behavior: Behavior normal.        Thought Content: Thought content normal.  Judgment: Judgment normal.     Wt Readings from Last 3 Encounters:  08/04/23 235 lb (106.6 kg)  08/03/23 235 lb (106.6 kg)  08/02/23 240 lb (108.9 kg)     ASSESSMENT & PLAN:   Inappropriate sinus tachycardia: Symptoms controlled on metoprolol titrate 50 mg twice daily.  Did not tolerate titrated dose of Toprol-XL previously.  HLD: LDL 56 in 03/2023 with normal AST/ALT in 05/2023.  Intolerant to statins.  Remains on Repatha and fenofibrate.  Obesity: Likely exacerbated by diet and sedentary lifestyle.  Previously referred to healthy weight and wellness center.  With underlying MS, her functional status is limited.  Heart healthy diet encouraged.   Disposition: F/u with Dr. Azucena Cecil or an APP in 12 months.   Medication Adjustments/Labs and Tests Ordered: Current medicines are reviewed at length with the patient today.  Concerns regarding medicines are outlined above. Medication changes, Labs and Tests ordered today are summarized above and listed in the Patient Instructions accessible in Encounters.   Signed, Eula Listen, PA-C 08/04/2023 4:24 PM     [facility] [place] [address] [place], [place] [address] [phone]

## 2023-08-02 ENCOUNTER — Ambulatory Visit (INDEPENDENT_AMBULATORY_CARE_PROVIDER_SITE_OTHER): Payer: PPO | Admitting: Internal Medicine

## 2023-08-02 ENCOUNTER — Encounter: Payer: Self-pay | Admitting: Internal Medicine

## 2023-08-02 VITALS — BP 110/78 | HR 87 | Temp 97.1°F | Ht 69.0 in | Wt 240.0 lb

## 2023-08-02 DIAGNOSIS — R21 Rash and other nonspecific skin eruption: Secondary | ICD-10-CM

## 2023-08-02 NOTE — Assessment & Plan Note (Addendum)
Non specific now but pictures look like possible photosensitivity She also notes dermatographism after showers No new medications or exposures Recommended checking back in with her dermatologist Chronic urticaria and sensitive skin---takes montelukast and cetirizine bid (of her own accord). Discussed montelukast should only be daily  Did try left over clobetasol---calmed it for about an hour then it worsened again

## 2023-08-02 NOTE — Progress Notes (Signed)
Subjective:    Patient ID: Emily Moon, female    DOB: 10/17/1961, 62 y.o.   MRN: [id number]  HPI Here due to rash  Started 5 days ago Skin was bright red on arms--hurt to touch---like a sunburn Still some redness but not as bad Has some bumps under the skin Very itchy now  No new medications or skin products (like soap) Doesn't really spend time outside  Dermatographic skin at times  Current Outpatient Medications on File Prior to Visit  Medication Sig Dispense Refill   aspirin EC 81 MG tablet Take 81 mg by mouth at bedtime. Swallow whole.     Azelastine HCl 137 MCG/SPRAY SOLN PLACE 2 SPRAYS INTO BOTH NOSTRILS 2 (TWO) TIMES DAILY 30 mL 11   B Complex-Biotin-FA (BIG 100, BIOTIN, PO) Take 300 mg by mouth at bedtime.     bisacodyl (DULCOLAX) 5 MG EC tablet Take 5 mg by mouth daily as needed for moderate constipation.     cetirizine (ZYRTEC) 10 MG tablet Take 10 mg by mouth daily.     clindamycin (CLEOCIN T) 1 % external solution Apply to scalp once or twice a day prn bumps 30 mL 2   Continuous Glucose Sensor (FREESTYLE LIBRE 3 SENSOR) MISC PLACE 1 SENSOR ON THE SKIN EVERY 14 DAYS. USE TO CHECK GLUCOSE CONTINUOUSLY 6 each 1   cyanocobalamin (VITAMIN B12) 1000 MCG/ML injection INJECT 1 ML (1,000 MCG TOTAL) INTO THE MUSCLE EVERY 30 (THIRTY) DAYS. FOR B12 VITAMIN 3 mL 3   desipramine (NORPRAMIN) 25 MG tablet 2  qam 180 tablet 0   diphenhydrAMINE (BENADRYL) 2 % cream Apply topically 3 (three) times daily as needed for itching.     Dulaglutide (TRULICITY) 3 MG/0.5ML SOPN Inject 3 mg into the skin once a week. 2 mL 1   DULoxetine (CYMBALTA) 20 MG capsule Take 2 capsules (40 mg total) by mouth daily. 180 capsule 0   estradiol (ESTRACE) 0.1 MG/GM vaginal cream Estrogen Cream Instruction Discard applicator Apply pea sized amount to tip of finger to urethra before bed. Wash hands well after application. Use Monday, Wednesday and Friday 42.5 g 11   Evolocumab (REPATHA SURECLICK) 140 MG/ML  SOAJ Inject 140 mg into the skin every 14 (fourteen) days. PLEASE CALL OFFICE TO SCHEDULE APPOINTMENT PRIOR TO NEXT REFILL 2 mL 2   fenofibrate (TRICOR) 145 MG tablet Take 1 tablet (145 mg total) by mouth daily. For cholesterol. Please call [phone] for an appointment. 90 tablet 3   hydrOXYzine (VISTARIL) 50 MG capsule Take 2 capsules (100 mg total) by mouth at bedtime. TAKE 1 CAPSLE BY MOUTH AT NOON, 1 AT 6 PM, AND 1 AS NEEDED 180 capsule 0   ibuprofen (ADVIL) 200 MG tablet Take 800 mg by mouth 2 (two) times daily.     ketoconazole (NIZORAL) 2 % shampoo Apply 1 Application topically as directed. Wash from waist up 3 times a week for 2 months, then decrease to 1 time a month, let sit 5 minutes and rinse off 120 mL 6   lamoTRIgine (LAMICTAL) 150 MG tablet Take 2 tablets (300 mg total) by mouth daily at bedtime. 180 tablet 0   levalbuterol (XOPENEX HFA) 45 MCG/ACT inhaler Inhale 2 puffs into the lungs every 4 (four) hours as needed for wheezing.     Lysine 500 MG TABS Take 1,000 mg by mouth daily.     metFORMIN (GLUCOPHAGE) 500 MG tablet Take 2 tabs by mouth every morning and 1 tab in the evening 270  tablet 3   methocarbamol (ROBAXIN) 500 MG tablet TAKE 1-2 TABLETS (500-1,000 MG TOTAL) BY MOUTH AT BEDTIME AS NEEDED FOR MUSCLE SPASMS. 180 tablet 0   metoprolol tartrate (LOPRESSOR) 50 MG tablet Take 1 tablet (50 mg total) by mouth 2 (two) times daily. 180 tablet 3   montelukast (SINGULAIR) 10 MG tablet TAKE 1 TABLET BY MOUTH EVERYDAY AT BEDTIME 90 tablet 1   mupirocin ointment (BACTROBAN) 2 % Apply 1 Application topically daily. Qd to wound on finger 22 g 1   neomycin-bacitracin-polymyxin (NEOSPORIN) [phone] ointment Apply 1 application topically 4 (four) times daily as needed (for cut/scrapes.).     NEOMYCIN-POLYMYXIN-HYDROCORTISONE (CORTISPORIN) 1 % SOLN OTIC solution Apply 1-2 drops to toe BID after soaking 10 mL 1   NON FORMULARY Garden of life women's probiotics     pantoprazole (PROTONIX) 40  MG tablet TAKE 1 TABLET (40 MG TOTAL) BY MOUTH 2 (TWO) TIMES DAILY BEFORE A MEAL. FOR HEARTBURN 180 tablet 3   polyvinyl alcohol (LIQUIFILM TEARS) 1.4 % ophthalmic solution Place 2 drops into both eyes at bedtime.     Probiotic Product (PROBIOTIC BLEND PO) Take by mouth.     Roflumilast (ZORYVE) 0.3 % CREA Apply 1 application  topically daily. qd to aa psoriasis on legs, arms 60 g 4   Ruxolitinib Phosphate (OPZELURA) 1.5 % CREA Apply to aa's rash QD PRN. 60 g 3   STIOLTO RESPIMAT 2.5-2.5 MCG/ACT AERS Inhale 2 puffs into the lungs at bedtime.     temazepam (RESTORIL) 30 MG capsule TAKE ONE TO TWO CAPSULES BY MOUTH NIGHTLY AT BEDTIME 180 capsule 0   traZODone (DESYREL) 50 MG tablet Take 2 tablets (100 mg total) by mouth at bedtime. 180 tablet 0   valACYclovir (VALTREX) 1000 MG tablet Take 2 tablets by mouth twice daily for 1 day as needed for herpes outbreak. 12 tablet 0   Vitamin D3 (VITAMIN D) 25 MCG tablet Take 3,000 Units by mouth daily.     No current facility-administered medications on file prior to visit.    Allergies  Allergen Reactions   Albuterol Shortness Of Breath and Other (See Comments)    Makes pt feel jittery/ tacycardic   Crestor [Rosuvastatin] Other (See Comments)    Joint pain, muscle pain, and hair loss   Halcion [Triazolam] Other (See Comments)    Dizziness,headaches,bladder problems   Levaquin [Levofloxacin In D5w] Diarrhea and Itching    Shoulder pain   Metformin And Related Rash   Naproxen Sodium     Patient tolerates in small doses. Her reaction is swelling of the lower extremities.    Ozempic (0.25 Or 0.5 Mg-Dose) [Semaglutide(0.25 Or 0.5mg -Dos)] Itching   Cefaclor Other (See Comments)    Doesn't remember---unsure if actually allergic    Prednisone     Elevates blood sugars   Sulfa Antibiotics Itching    Unsure of reaction possibly itching   Tramadol Itching and Nausea And Vomiting   Zinc Other (See Comments)    constipation  constipation    Aripiprazole  Other (See Comments)    Muscle tension/cramping   Diclofenac Sodium Rash    "made very sick"    Past Medical History:  Diagnosis Date   Acute pyelonephritis 12/25/2021   Arthritis    osteo   Asthma    Bipolar disorder (HCC) 05/21/2014   Cataracts, bilateral    Cervical radiculitis    Chest pain    Chronic pain syndrome    COPD (chronic obstructive pulmonary disease) (HCC)  DDD (degenerative disc disease), cervical    also back   Depression    Diabetes mellitus without complication (HCC)    Dizziness    Positional   Edema    feet/legs   Epigastric pain    Fibromyalgia syndrome    Fungal infection    Finger nails   GERD (gastroesophageal reflux disease)    Gout    Heart palpitations    Hip dysplasia, 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   History of kidney stones    Hx of sepsis    Hypercholesterolemia    IDA (iron deficiency anemia)    Multiple sclerosis (HCC)    weakness   Osteoporosis    osteoarthritis   Pneumonia    PONV (postoperative nausea and vomiting)    no problem after cataract surgery   Prediabetes 07/22/2018   Psoriasis    Sciatica    Seasonal allergies    Shortness of breath dyspnea    wheezing   Sleep apnea 2012   sleep study / slight, no interventions   Urinary frequency    Vitamin B 12 deficiency    Weight gain 06/21/2014    Past Surgical History:  Procedure Laterality Date   CATARACT EXTRACTION W/PHACO Left 05/21/2015   Procedure: CATARACT EXTRACTION PHACO AND INTRAOCULAR LENS PLACEMENT (IOC);  Surgeon: Galen Manila, MD;  Location: [facility];  Service: Ophthalmology;  Laterality: Left;  [place] 00:35 AP% 22.9 CDE 8.11 fluid pack lot #[id number]   CATARACT EXTRACTION W/PHACO Right 06/04/2015   Procedure: CATARACT EXTRACTION PHACO AND INTRAOCULAR LENS PLACEMENT (IOC);  Surgeon: Galen Manila, MD;  Location: [facility];  Service: Ophthalmology;  Laterality: Right;  US:00:48 AP%: 10.5 CDE:5.08 Fluid lot #[id number]   CYSTOSCOPY/URETEROSCOPY/HOLMIUM  LASER/STENT PLACEMENT Bilateral 09/22/2016   Procedure: CYSTOSCOPY/URETEROSCOPY/HOLMIUM LASER/STENT PLACEMENT;  Surgeon: Vanna Scotland, MD;  Location: [facility];  Service: Urology;  Laterality: Bilateral;   EYE SURGERY  2015   tissue biopsy   FOOT SURGERY  2015   JOINT REPLACEMENT Left 2013   hip replacement   LITHOTRIPSY     PTOSIS REPAIR Bilateral 02/18/2016   Procedure: BILATERAL PTOSIS REPAIR UPPER EYELIDS;  Surgeon: Imagene Riches, MD;  Location: [facility];  Service: Ophthalmology;  Laterality: Bilateral;  LEAVE PT EARLY AM   thumb surgery Right    TONSILLECTOMY  1973   TOTAL HIP ARTHROPLASTY Right 07/02/2021   Procedure: TOTAL HIP ARTHROPLASTY ANTERIOR APPROACH;  Surgeon: Ollen Gross, MD;  Location: [facility];  Service: Orthopedics;  Laterality: Right;    Family History  Problem Relation Age of Onset   Cancer Mother    Heart disease Mother    Diabetes Mother    Sleep apnea Mother    Cancer Father        Abdomen with mastasis   High Cholesterol Father    Kidney disease Neg Hx    Bladder Cancer Neg Hx    Prostate cancer Neg Hx    Kidney cancer Neg Hx     Social History   Socioeconomic History   Marital status: Divorced    Spouse name: Not on file   Number of children: 1   Years of education: Not on file   Highest education level: Bachelor's degree (e.g., BA, AB, BS)  Occupational History   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   Occupation: Disability  Tobacco Use   Smoking status: Former    Current packs/day: 0.00    Average packs/day: 1 pack/day for 25.0 years (25.0 ttl  pk-yrs)    Types: Cigarettes    Start date: 21    Quit date: 2019    Years since quitting: 5.7    Passive exposure: Past   Smokeless tobacco: Never   Tobacco comments:    occasional use  Vaping Use   Vaping status: Former  Substance and Sexual Activity   Alcohol use: No    Alcohol/week: 0.0 standard drinks of alcohol   Drug use: Yes    Types:  Methylphenidate, Marijuana    Comment: Delta 8 - once per day   Sexual activity: Never  Other Topics Concern   Not on file  Social History Narrative   Not on file   Social Determinants of Health   Financial Resource Strain: High Risk (07/29/2023)   Overall Financial Resource Strain (CARDIA)    Difficulty of Paying Living Expenses: Hard  Food Insecurity: Food Insecurity Present (07/29/2023)   Hunger Vital Sign    Worried About Running Out of Food in the Last Year: Sometimes true    Ran Out of Food in the Last Year: Sometimes true  Transportation Needs: No Transportation Needs (07/29/2023)   PRAPARE - Administrator, Civil Service (Medical): No    Lack of Transportation (Non-Medical): No  Physical Activity: Inactive (07/29/2023)   Exercise Vital Sign    Days of Exercise per Week: 0 days    Minutes of Exercise per Session: 20 min  Stress: Stress Concern Present (07/29/2023)   [employer] of Occupational Health - Occupational Stress Questionnaire    Feeling of Stress : Very much  Social Connections: Socially Isolated (07/29/2023)   Social Connection and Isolation Panel [NHANES]    Frequency of Communication with Friends and Family: Once a week    Frequency of Social Gatherings with Friends and Family: Never    Attends Religious Services: Never    Database administrator or Organizations: No    Attends Banker Meetings: Never    Marital Status: Divorced  Catering manager Violence: Not At Risk (10/01/2022)   Humiliation, Afraid, Rape, and Kick questionnaire    Fear of Current or Ex-Partner: No    Emotionally Abused: No    Physically Abused: No    Sexually Abused: No   Review of Systems Past hives--has seen derm for that     Objective:   Physical Exam Constitutional:      Appearance: Normal appearance.  Skin:    Comments: Mild vague red areas on forearms No clear cut papules or distinct lesions  Neurological:     Mental Status: She is alert.             Assessment & Plan:

## 2023-08-03 ENCOUNTER — Encounter (INDEPENDENT_AMBULATORY_CARE_PROVIDER_SITE_OTHER): Payer: Self-pay | Admitting: Family Medicine

## 2023-08-03 ENCOUNTER — Ambulatory Visit (INDEPENDENT_AMBULATORY_CARE_PROVIDER_SITE_OTHER): Payer: PPO | Admitting: Family Medicine

## 2023-08-03 VITALS — BP 98/67 | HR 95 | Temp 99.0°F | Ht 68.5 in | Wt 235.0 lb

## 2023-08-03 DIAGNOSIS — E669 Obesity, unspecified: Secondary | ICD-10-CM | POA: Insufficient documentation

## 2023-08-03 DIAGNOSIS — Z6835 Body mass index (BMI) 35.0-35.9, adult: Secondary | ICD-10-CM | POA: Insufficient documentation

## 2023-08-03 DIAGNOSIS — Z7984 Long term (current) use of oral hypoglycemic drugs: Secondary | ICD-10-CM | POA: Diagnosis not present

## 2023-08-03 DIAGNOSIS — Z7985 Long-term (current) use of injectable non-insulin antidiabetic drugs: Secondary | ICD-10-CM | POA: Diagnosis not present

## 2023-08-03 DIAGNOSIS — E1165 Type 2 diabetes mellitus with hyperglycemia: Secondary | ICD-10-CM

## 2023-08-04 ENCOUNTER — Encounter: Payer: Self-pay | Admitting: Physician Assistant

## 2023-08-04 ENCOUNTER — Ambulatory Visit: Payer: PPO | Attending: Physician Assistant | Admitting: Physician Assistant

## 2023-08-04 VITALS — BP 96/68 | HR 84 | Ht 69.0 in | Wt 235.0 lb

## 2023-08-04 DIAGNOSIS — E782 Mixed hyperlipidemia: Secondary | ICD-10-CM

## 2023-08-04 DIAGNOSIS — E669 Obesity, unspecified: Secondary | ICD-10-CM

## 2023-08-04 DIAGNOSIS — I4711 Inappropriate sinus tachycardia, so stated: Secondary | ICD-10-CM | POA: Diagnosis not present

## 2023-08-04 DIAGNOSIS — Z6834 Body mass index (BMI) 34.0-34.9, adult: Secondary | ICD-10-CM | POA: Diagnosis not present

## 2023-08-04 MED ORDER — FENOFIBRATE 145 MG PO TABS: 145.0000 mg | ORAL_TABLET | Freq: Every day | ORAL | 3 refills | Status: DC

## 2023-08-04 MED ORDER — REPATHA SURECLICK 140 MG/ML ~~LOC~~ SOAJ: 140.0000 mg | SUBCUTANEOUS | 12 refills | Status: DC

## 2023-08-04 MED ORDER — METOPROLOL TARTRATE 50 MG PO TABS: 50.0000 mg | ORAL_TABLET | Freq: Two times a day (BID) | ORAL | 3 refills | Status: DC

## 2023-08-04 NOTE — Progress Notes (Unsigned)
Chief Complaint:   OBESITY Emily Moon is here to discuss her progress with her obesity treatment plan along with follow-up of her obesity related diagnoses. Emily Moon is on following a lower carbohydrate, vegetable and lean protein rich diet plan and states she is following her eating plan approximately 0% of the time. Emily Moon states she is doing 0 minutes 0 times per week.  Today's visit was #: 5 Starting weight: 247 lbs Starting date: 03/23/2023 Today's weight: 235 lbs Today's date: 08/03/2023 Total lbs lost to date: 12 Total lbs lost since last in-office visit: 0  Interim History: Patient is following her own version of intermittent fasting.  She is working on [employer] soda.  She is trying to increase her water intake.  Her RMR was 2146 and this meets her goal.  Subjective:   1. Type 2 diabetes mellitus with hyperglycemia, without long-term current use of insulin (HCC) Patient's last A1c was 6.6.  She has occasional hypoglycemia.  Assessment/Plan:   1. Type 2 diabetes mellitus with hyperglycemia, without long-term current use of insulin (HCC) Patient will continue metformin and Trulicity at 3 mg once weekly, and she will keep working on her diet and exercise.  2. BMI 35.0-35.9,adult  3. Obesity, Beginning BMI 37.56 Emily Moon is currently in the action stage of change. As such, her goal is to continue with weight loss efforts. She has agreed to keeping a food journal and adhering to recommended goals of 1500-1800 calories and 85+ grams of protein daily.   Behavioral modification strategies: increasing lean protein intake.  Emily Moon has agreed to follow-up with our clinic in 4 weeks. She was informed of the importance of frequent follow-up visits to maximize her success with intensive lifestyle modifications for her multiple health conditions.   Objective:   Blood pressure 98/67, pulse 95, temperature 99 F (37.2 C), height 5' 8.5" (1.74 m), weight 235 lb (106.6 kg), last  menstrual period 08/23/2014, SpO2 93%. Body mass index is 35.21 kg/m.  Lab Results  Component Value Date   CREATININE 0.97 06/16/2023   BUN 25 (H) 06/16/2023   NA 136 06/16/2023   K 4.0 06/16/2023   CL 99 06/16/2023   CO2 26 06/16/2023   Lab Results  Component Value Date   ALT 15 06/16/2023   AST 15 06/16/2023   ALKPHOS 37 (L) 06/16/2023   BILITOT 0.5 06/16/2023   Lab Results  Component Value Date   HGBA1C 6.6 (H) 03/23/2023   HGBA1C 6.2 (A) 12/03/2022   HGBA1C 6.9 (H) 08/20/2022   HGBA1C 6.5 (A) 04/01/2022   HGBA1C 6.7 (A) 11/04/2021   Lab Results  Component Value Date   INSULIN <0.4 (L) 03/23/2023   Lab Results  Component Value Date   TSH 4.860 (H) 03/23/2023   Lab Results  Component Value Date   CHOL 122 03/23/2023   HDL 39 (L) 03/23/2023   LDLCALC 56 03/23/2023   LDLDIRECT 53.0 08/01/2020   TRIG 155 (H) 03/23/2023   CHOLHDL 4 08/20/2022   Lab Results  Component Value Date   VD25OH 95.8 03/23/2023   VD25OH 75.66 01/22/2020   VD25OH 61.68 08/15/2019   Lab Results  Component Value Date   WBC 8.0 06/16/2023   HGB 12.9 06/16/2023   HCT 39.4 06/16/2023   MCV 90.2 06/16/2023   PLT 436 (H) 06/16/2023   Lab Results  Component Value Date   IRON 103 06/16/2023   TIBC 557 (H) 06/16/2023   FERRITIN 55 06/16/2023   Attestation Statements:  Reviewed by clinician on day of visit: allergies, medications, problem list, medical history, surgical history, family history, social history, and previous encounter notes.   I, Burt Knack, am acting as transcriptionist for Quillian Quince, MD.  I have reviewed the above documentation for accuracy and completeness, and I agree with the above. -  Quillian Quince, MD

## 2023-08-04 NOTE — Patient Instructions (Signed)
Medication Instructions:  Your Physician recommend you continue on your current medication as directed.    *If you need a refill on your cardiac medications before your next appointment, please call your pharmacy*  Lab Work: NONE If you have labs (blood work) drawn today and your tests are completely normal, you will receive your results only by: MyChart Message (if you have MyChart) OR A paper copy in the mail If you have any lab test that is abnormal or we need to change your treatment, we will call you to review the results.  Follow-Up: At [facility], you and your health needs are our priority.  As part of our continuing mission to provide you with exceptional heart care, we have created designated Provider Care Teams.  These Care Teams include your primary Cardiologist (physician) and Advanced Practice Providers (APPs -  Physician Assistants and Nurse Practitioners) who all work together to provide you with the care you need, when you need it.  We recommend signing up for the patient portal called "MyChart".  Sign up information is provided on this After Visit Summary.  MyChart is used to connect with patients for Virtual Visits (Telemedicine).  Patients are able to view lab/test results, encounter notes, upcoming appointments, etc.  Non-urgent messages can be sent to your provider as well.   To learn more about what you can do with MyChart, go to [email or web address].    Your next appointment:   12 month(s)  Provider:   You may see Debbe Odea, MD or one of the following Advanced Practice Providers on your designated Care Team:   Eula Listen, [place]

## 2023-08-05 DIAGNOSIS — R413 Other amnesia: Secondary | ICD-10-CM | POA: Diagnosis not present

## 2023-08-05 DIAGNOSIS — R200 Anesthesia of skin: Secondary | ICD-10-CM | POA: Diagnosis not present

## 2023-08-05 DIAGNOSIS — R5383 Other fatigue: Secondary | ICD-10-CM | POA: Diagnosis not present

## 2023-08-05 DIAGNOSIS — G35 Multiple sclerosis: Secondary | ICD-10-CM | POA: Diagnosis not present

## 2023-08-05 DIAGNOSIS — R531 Weakness: Secondary | ICD-10-CM | POA: Diagnosis not present

## 2023-08-06 ENCOUNTER — Ambulatory Visit (INDEPENDENT_AMBULATORY_CARE_PROVIDER_SITE_OTHER): Payer: PPO | Admitting: Physician Assistant

## 2023-08-06 ENCOUNTER — Encounter: Payer: Self-pay | Admitting: Physician Assistant

## 2023-08-06 DIAGNOSIS — N3281 Overactive bladder: Secondary | ICD-10-CM | POA: Diagnosis not present

## 2023-08-06 NOTE — Progress Notes (Signed)
Patient ID: Emily Moon, female   DOB: Aug 10, 1961, 62 y.o.   MRN: [id number] PTNS  Session # 6 (lifetime #18)  Health & Social Factors: no change Caffeine: 1 Alcohol: 0 Daytime voids #per day: around 20 Night-time voids #per night: 1 Urgency: mild Incontinence Episodes #per day: 1 Ankle used: right Treatment Setting: 5 Feeling/ Response: sensory Comments: pt tolerated well  Performed By: Mervin Hack, CMA and Carman Ching, PA-C   Follow Up: 1 week

## 2023-08-06 NOTE — Patient Instructions (Signed)

## 2023-08-08 ENCOUNTER — Other Ambulatory Visit (HOSPITAL_COMMUNITY): Payer: Self-pay | Admitting: Psychiatry

## 2023-08-09 ENCOUNTER — Ambulatory Visit (INDEPENDENT_AMBULATORY_CARE_PROVIDER_SITE_OTHER): Payer: PPO | Admitting: Podiatry

## 2023-08-09 ENCOUNTER — Encounter: Payer: Self-pay | Admitting: Podiatry

## 2023-08-09 DIAGNOSIS — L600 Ingrowing nail: Secondary | ICD-10-CM | POA: Diagnosis not present

## 2023-08-09 DIAGNOSIS — L03031 Cellulitis of right toe: Secondary | ICD-10-CM

## 2023-08-09 DIAGNOSIS — E119 Type 2 diabetes mellitus without complications: Secondary | ICD-10-CM

## 2023-08-09 NOTE — Progress Notes (Signed)
She presents today for follow-up of her paronychia lateral border fourth toe right foot.  States that it still does not really look very good but I figured out while walking on it I took a picture when I broke the toe and you are exactly right on walking on the side of it that is why is not healing well and it is painful.  Objective: Vital signs are stable alert oriented x 3.  Pulses are palpable.  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  The small wound to the lateral border of the nail is starting to heal.  It is almost closed but there is mild erythema still present.  I like to be assured that this is not going cause infection.  Assessment mallet toe deformity with resolving paronychia fourth right.  Possible extensor rupture.  Plan: At this point once this has healed 100% I would like to see her for a flexor tenotomy fourth digit right foot.

## 2023-08-12 ENCOUNTER — Ambulatory Visit: Payer: PPO | Admitting: Physician Assistant

## 2023-08-13 ENCOUNTER — Other Ambulatory Visit: Payer: Self-pay | Admitting: Neurology

## 2023-08-13 ENCOUNTER — Ambulatory Visit: Payer: PPO | Admitting: Physician Assistant

## 2023-08-13 DIAGNOSIS — G35 Multiple sclerosis: Secondary | ICD-10-CM

## 2023-08-17 ENCOUNTER — Other Ambulatory Visit: Payer: Self-pay | Admitting: Primary Care

## 2023-08-17 DIAGNOSIS — E1165 Type 2 diabetes mellitus with hyperglycemia: Secondary | ICD-10-CM

## 2023-08-18 ENCOUNTER — Ambulatory Visit (INDEPENDENT_AMBULATORY_CARE_PROVIDER_SITE_OTHER): Payer: PPO | Admitting: Physician Assistant

## 2023-08-19 ENCOUNTER — Ambulatory Visit: Payer: PPO | Admitting: Physician Assistant

## 2023-08-23 ENCOUNTER — Telehealth: Payer: Self-pay

## 2023-08-23 ENCOUNTER — Ambulatory Visit: Payer: PPO | Admitting: Dermatology

## 2023-08-23 ENCOUNTER — Ambulatory Visit: Payer: PPO | Admitting: Physician Assistant

## 2023-08-23 NOTE — Telephone Encounter (Signed)
Called pt and left message for her to call office to reschedule today's appointment with Dr. Katrinka Blazing since he is not in office today./sh

## 2023-08-23 NOTE — Telephone Encounter (Signed)
ERROR

## 2023-08-24 ENCOUNTER — Telehealth (INDEPENDENT_AMBULATORY_CARE_PROVIDER_SITE_OTHER): Payer: PPO | Admitting: Psychology

## 2023-08-24 DIAGNOSIS — F319 Bipolar disorder, unspecified: Secondary | ICD-10-CM

## 2023-08-24 DIAGNOSIS — F5089 Other specified eating disorder: Secondary | ICD-10-CM | POA: Diagnosis not present

## 2023-08-24 DIAGNOSIS — F439 Reaction to severe stress, unspecified: Secondary | ICD-10-CM
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
## 2023-08-25 DIAGNOSIS — E119 Type 2 diabetes mellitus without complications: Secondary | ICD-10-CM | POA: Diagnosis not present

## 2023-08-25 DIAGNOSIS — L409 Psoriasis, unspecified: Secondary | ICD-10-CM | POA: Diagnosis not present

## 2023-08-25 DIAGNOSIS — G894 Chronic pain syndrome: Secondary | ICD-10-CM | POA: Diagnosis not present

## 2023-08-25 DIAGNOSIS — M797 Fibromyalgia: Secondary | ICD-10-CM | POA: Diagnosis not present

## 2023-08-26 ENCOUNTER — Ambulatory Visit (INDEPENDENT_AMBULATORY_CARE_PROVIDER_SITE_OTHER): Payer: PPO | Admitting: Physician Assistant

## 2023-08-26 ENCOUNTER — Ambulatory Visit: Payer: PPO | Admitting: Physician Assistant

## 2023-08-26 DIAGNOSIS — N3281 Overactive bladder: Secondary | ICD-10-CM | POA: Diagnosis not present

## 2023-08-26 NOTE — Progress Notes (Signed)
Patient for DG Lumbar Puncture on Friday 08/27/2023, I called and spoke with the patient on the phone and gave pre-procedure instructions. Pt was made aware to be here at 10:30a and check in at the new entrance. Pt stated understanding.  Called 08/26/23

## 2023-08-26 NOTE — Patient Instructions (Signed)

## 2023-08-26 NOTE — Progress Notes (Signed)
PTNS  Session # 7 (lifetime #19)  Health & Social Factors: no change Caffeine: 1 Alcohol: 0 Daytime voids #per day: 20 Night-time voids #per night: 1 Urgency: strong Incontinence Episodes #per day: 0 Ankle used: right Treatment Setting: 2 Feeling/ Response: both Comments: Patient tolerated well. She is noticing an improvement in frequency.  Performed By: Carman Ching, PA-C   Follow Up: 1 week for PTNS #8 (lifetime #20)

## 2023-08-27 ENCOUNTER — Ambulatory Visit
Admission: RE | Admit: 2023-08-27 | Discharge: 2023-08-27 | Disposition: A | Discharge status: Home / Self Care | Payer: PPO | Source: Ambulatory Visit | Attending: Neurology | Admitting: Neurology

## 2023-08-27 DIAGNOSIS — G35 Multiple sclerosis: Secondary | ICD-10-CM | POA: Diagnosis not present

## 2023-08-27 LAB — CSF CELL COUNT WITH DIFFERENTIAL
RBC Count, CSF: 5 /mm3 — ABNORMAL HIGH (ref 0–3)
Tube #: 3
WBC, CSF: 0 /mm3 (ref 0–5)

## 2023-08-27 LAB — PROTEIN AND GLUCOSE, CSF
Glucose, CSF: 86 mg/dL — ABNORMAL HIGH (ref 40–70)
Total  Protein, CSF: 34 mg/dL (ref 15–45)

## 2023-08-27 MED ORDER — LIDOCAINE 1 % OPTIME INJ - NO CHARGE
5.0000 mL | Freq: Once | INTRAMUSCULAR | Status: AC
  Administered 2023-08-27: 5 mL
  Filled 2023-08-27: qty 6

## 2023-08-27 NOTE — Procedures (Signed)
Technically successful fluoro guided LP at L4-L5 level with opening pressure of 19 cm H2O and closing pressure of 16 cm H2O 4 cc of clear CSF sent to lab for analysis.  No immediate post procedural complication.  Please see imaging section of Epic for full dictation.    Mina Marble, PA-C 08/27/2023, 2:00 PM

## 2023-08-27 NOTE — Discharge Instructions (Signed)
Lumbar Puncture, Care After Refer to this sheet in the next few weeks. These instructions provide you with information on caring for yourself after your procedure. Your health care provider may also give you more specific instructions. Your treatment has been planned according to current medical practices, but problems sometimes occur. Call your health care provider if you have any problems or questions after your procedure. What can I expect after the procedure? After your procedure, it is typical to have the following sensations: Mild discomfort or pain at the insertion site. Mild headache that is relieved with pain medicines.  Follow these instructions at home:  Avoid lifting anything heavier than 10 lb (4.5 kg) for at least 12 hours after the procedure. Drink enough fluids to keep your urine clear or pale yellow. Lay flat or as flat as possible for the remainder of the day. Contact a health care provider if: You have fever or chills. You have nausea or vomiting. You have a headache that lasts for more than 2 days. Get help right away if: You have any numbness or tingling in your legs. You are unable to control your bowel or bladder. You have bleeding or swelling in your back at the insertion site. You are dizzy or faint. This information is not intended to replace advice given to you by your health care provider. Make sure you discuss any questions you have with your health care provider. Document Released: 11/07/2013 Document Revised: 04/09/2016 Document Reviewed: 07/11/2013 Elsevier Interactive Patient Education  2017 [employer] 

## 2023-08-30 ENCOUNTER — Ambulatory Visit (INDEPENDENT_AMBULATORY_CARE_PROVIDER_SITE_OTHER): Payer: PPO | Admitting: Physician Assistant

## 2023-08-30 ENCOUNTER — Other Ambulatory Visit: Payer: Self-pay | Admitting: Dermatology

## 2023-08-30 DIAGNOSIS — L42 Pityriasis rosea: Secondary | ICD-10-CM

## 2023-08-30 DIAGNOSIS — N3281 Overactive bladder: Secondary | ICD-10-CM | POA: Diagnosis not present

## 2023-08-30 LAB — CSF CULTURE W GRAM STAIN
Culture: NO GROWTH
Gram Stain: NONE SEEN

## 2023-08-30 NOTE — Patient Instructions (Signed)

## 2023-08-30 NOTE — Progress Notes (Signed)
PTNS  Session # 8 (lifetime #20)  Health & Social Factors: no change Caffeine: 1 Alcohol: 0 Daytime voids #per day: 10 Night-time voids #per night: 1 Urgency: none Incontinence Episodes #per day: SUI only Ankle used: right Treatment Setting: 5 Feeling/ Response: sensory Comments: Patient tolerated well. Continues to report improvement.  Performed By: Carman Ching, PA-C   Follow Up: 1 week

## 2023-08-31 ENCOUNTER — Ambulatory Visit: Payer: PPO | Admitting: Dermatology

## 2023-08-31 LAB — IGG CSF INDEX
Albumin CSF-mCnc: 19 mg/dL (ref 8–37)
Albumin: 4.2 g/dL (ref 3.9–4.9)
CSF IgG Index: 0.8 — ABNORMAL HIGH (ref 0.0–0.7)
IgG (Immunoglobin G), Serum: 596 mg/dL (ref 586–1602)
IgG, CSF: 2.1 mg/dL (ref 0.0–6.7)
IgG/Alb Ratio, CSF: 0.11 (ref 0.00–0.25)

## 2023-09-01 ENCOUNTER — Ambulatory Visit (INDEPENDENT_AMBULATORY_CARE_PROVIDER_SITE_OTHER): Payer: PPO | Admitting: Physician Assistant

## 2023-09-01 LAB — OLIGOCLONAL BANDS, CSF + SERM

## 2023-09-04 ENCOUNTER — Other Ambulatory Visit (HOSPITAL_COMMUNITY): Payer: Self-pay | Admitting: Psychiatry

## 2023-09-07 ENCOUNTER — Ambulatory Visit (INDEPENDENT_AMBULATORY_CARE_PROVIDER_SITE_OTHER): Payer: PPO | Admitting: Physician Assistant

## 2023-09-08 DIAGNOSIS — R531 Weakness: Secondary | ICD-10-CM | POA: Diagnosis not present

## 2023-09-08 DIAGNOSIS — F32A Depression, unspecified: Secondary | ICD-10-CM | POA: Diagnosis not present

## 2023-09-08 DIAGNOSIS — R5383 Other fatigue: Secondary | ICD-10-CM | POA: Diagnosis not present

## 2023-09-08 DIAGNOSIS — G35 Multiple sclerosis: Secondary | ICD-10-CM | POA: Diagnosis not present

## 2023-09-08 DIAGNOSIS — R413 Other amnesia: Secondary | ICD-10-CM | POA: Diagnosis not present

## 2023-09-08 DIAGNOSIS — R200 Anesthesia of skin: Secondary | ICD-10-CM | POA: Diagnosis not present

## 2023-09-10 ENCOUNTER — Ambulatory Visit: Payer: PPO | Admitting: Urology

## 2023-09-10 NOTE — Telephone Encounter (Signed)
Pt states she will call us back to schedule PTNS appoint #9-12 and a 1 month follow up.

## 2023-09-20 ENCOUNTER — Telehealth (INDEPENDENT_AMBULATORY_CARE_PROVIDER_SITE_OTHER): Payer: PPO | Admitting: Psychology

## 2023-09-20 DIAGNOSIS — F5089 Other specified eating disorder: Secondary | ICD-10-CM

## 2023-09-20 DIAGNOSIS — F319 Bipolar disorder, unspecified: Secondary | ICD-10-CM | POA: Diagnosis not present

## 2023-09-20 DIAGNOSIS — F439 Reaction to severe stress, unspecified: Secondary | ICD-10-CM | POA: Diagnosis not present

## 2023-09-20 NOTE — Progress Notes (Signed)
  Office: [phone]  /  Fax: [phone]    Date: September 20, 2023  Appointment Start Time: 1:57pm Duration: 34 minutes Provider: Lawerance Cruel, Psy.D. Type of Session: Individual Therapy  Location of Patient: Home (private location) Location of Provider: Provider's Home (private office) Type of Contact: Telepsychological Visit via MyChart Video Visit  Session Content: Emily Moon is a 62 y.o. female presenting for a follow-up appointment to address the previously established treatment goal of increasing coping skills.Today's appointment was a telepsychological visit. Emily Moon provided verbal consent for today's telepsychological appointment and she is aware she is responsible for securing confidentiality on her end of the session. Prior to proceeding with today's appointment, Emily Moon's physical location at the time of this appointment was obtained as well a phone number she could be reached at in the event of technical difficulties. Emily Moon and this provider participated in today's telepsychological service.   This provider conducted a brief check-in. Emily Moon reported a desire to take a break from services with Dr. Cathey Endow. Further explored and processed. She stated she is only consuming one meal a day. Further explored and processed. This provider discussed the importance/consequences of not eating regularly. Psychoeducation regarding emotional versus physical hunger was provided. Emily Moon provided verbal consent during today's appointment for this provider to send a handout about hunger types via e-mail. A risk assessment was also completed. Emily Moon denied experiencing suicidal, self-harm and homicidal ideation, plan, and intent since the last appointment with this provider. She continues to acknowledge understanding regarding the importance of reaching out to trusted individuals and/or emergency resources if she is unable to ensure safety. Since the last appointment with this provider and described  future plans that include ongoing efforts to improve her well-being and eating habits. Overall, Emily Moon was receptive to today's appointment as evidenced by openness to sharing, responsiveness to feedback, and willingness to discuss emotional and physical hunger .  Mental Status Examination:  Appearance: neat Behavior: appropriate to circumstances Mood: neutral Affect: mood congruent Speech: WNL Eye Contact: appropriate Psychomotor Activity: WNL Gait: unable to assess Thought Process: linear, logical, and goal directed and denies suicidal, homicidal, and self-harm ideation, plan and intent  Thought Content/Perception: no hallucinations, delusions, bizarre thinking or behavior endorsed or observed Orientation: AAOx4 Memory/Concentration: intact Insight: fair Judgment: fair  Interventions:  Conducted a brief chart review Conducted a risk assessment Provided empathic reflections and validation Employed supportive psychotherapy interventions to facilitate reduced distress and to improve coping skills with identified stressors Psychoeducation provided regarding emotional and physical hunger Recommended traditional therapeutic services  DSM-5 Diagnosis(es):  F50.89 Other Specified Feeding or Eating Disorder, Emotional Eating Behaviors, 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   Plan: The next appointment is scheduled for 10/18/2023 at 2pm, which will be via MyChart Video Visit. The next session will focus on working towards the established treatment goal. Emily Moon will work to establish care with a primary therapist. Referrals were re-sent via e-mail per her request. She further noted she has an appointment with her psychiatric  provider tomorrow.

## 2023-09-21 ENCOUNTER — Encounter (HOSPITAL_COMMUNITY): Payer: Self-pay | Admitting: Psychiatry

## 2023-09-21 ENCOUNTER — Ambulatory Visit (HOSPITAL_COMMUNITY): Payer: PPO | Admitting: Psychiatry

## 2023-09-21 ENCOUNTER — Other Ambulatory Visit: Payer: Self-pay

## 2023-09-21 VITALS — BP 120/75 | HR 91 | Ht 66.0 in | Wt 239.0 lb

## 2023-09-21 DIAGNOSIS — F321 Major depressive disorder, single episode, moderate: Secondary | ICD-10-CM | POA: Diagnosis not present

## 2023-09-21 DIAGNOSIS — F310 Bipolar disorder, current episode hypomanic: Secondary | ICD-10-CM

## 2023-09-21 MED ORDER — LAMOTRIGINE 150 MG PO TABS: ORAL_TABLET | ORAL | 0 refills | Status: DC

## 2023-09-21 MED ORDER — DESIPRAMINE HCL 25 MG PO TABS: ORAL_TABLET | ORAL | 0 refills | Status: DC

## 2023-09-21 MED ORDER — DULOXETINE HCL 20 MG PO CPEP: 40.0000 mg | ORAL_CAPSULE | Freq: Every day | ORAL | 0 refills | Status: DC

## 2023-09-21 NOTE — Progress Notes (Signed)
The most painful.  This  Psychiatric Initial Adult Assessment   Patient Identification: Emily Moon MRN:  [id number] Date of Evaluation:  09/21/2023 Referral Source: From the community Chief Complaint: Feels exhausted  This patient has not been seen in 9 months.  She is doing just about the same.  She takes multiple psychotropic medications.  She has few friends that live in the community.  She has 1 wealthy friend who is very helpful.  Her son is going to have their third child.  The patient feels a lot of financial stress.  The patient is in a weight loss clinic but is having a hard time losing weight.  Generally her mood is about the same.  She denies persistent new depression.  She has never been psychotic.  She denies use of alcohol or drugs.  She did have COVID but she has recovered.  Procedure(s) Performed: * No surgery found *  Anesthes  Patient location:   Post pain:   Post assessment:   Last Vitals:  Vitals:   09/21/23 1602  BP: 120/75  Pulse: 91    Post vital signs:   Level of consciousness:   Complications: . Associated Signs/Symptoms: Depression Symptoms:  hopelessness, (Hypo) Manic Symptoms:   Anxiety Symptoms:   Psychotic Symptoms:   PTSD Symptoms:   Past Psychiatric History: The patient was seen in a psychiatric office and takes multiple medications she's not been recently hospitalized.  Previous Psychotropic Medications: Yes   Substance Abuse History in the last 12 months:  No.  Consequences of Substance Abuse: Negative  Past Medical History:  Past Medical History:  Diagnosis Date   Acute pyelonephritis 12/25/2021   Arthritis    osteo   Asthma    Bipolar disorder (HCC) 05/21/2014   Cataracts, bilateral    Cervical radiculitis    Chest pain    Chronic pain syndrome    COPD (chronic obstructive pulmonary disease) (HCC)    DDD (degenerative disc disease), cervical    also back   Depression    Diabetes mellitus without complication  (HCC)    Dizziness    Positional   Edema    feet/legs   Epigastric pain    Fibromyalgia syndrome    Fungal infection    Finger nails   GERD (gastroesophageal reflux disease)    Gout    Heart palpitations    Hip dysplasia, 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   History of kidney stones    Hx of sepsis    Hypercholesterolemia    IDA (iron deficiency anemia)    Multiple sclerosis (HCC)    weakness   Osteoporosis    osteoarthritis   Pneumonia    PONV (postoperative nausea and vomiting)    no problem after cataract surgery   Prediabetes 07/22/2018   Psoriasis    Sciatica    Seasonal allergies    Shortness of breath dyspnea    wheezing   Sleep apnea 2012   sleep study / slight, no interventions   Urinary frequency    Vitamin B 12 deficiency    Weight gain 06/21/2014    Past Surgical History:  Procedure Laterality Date   CATARACT EXTRACTION W/PHACO Left 05/21/2015   Procedure: CATARACT EXTRACTION PHACO AND INTRAOCULAR LENS PLACEMENT (IOC);  Surgeon: Galen Manila, MD;  Location: [facility];  Service: Ophthalmology;  Laterality: Left;  [place] 00:35 AP% 22.9 CDE 8.11 fluid pack lot #[id number]   CATARACT EXTRACTION W/PHACO Right 06/04/2015   Procedure: CATARACT EXTRACTION PHACO AND INTRAOCULAR LENS PLACEMENT (IOC);  Surgeon: Galen Manila, MD;  Location: [facility];  Service: Ophthalmology;  Laterality: Right;  US:00:48 AP%: 10.5 CDE:5.08 Fluid lot #[id number]   CYSTOSCOPY/URETEROSCOPY/HOLMIUM LASER/STENT PLACEMENT Bilateral 09/22/2016   Procedure: CYSTOSCOPY/URETEROSCOPY/HOLMIUM LASER/STENT PLACEMENT;  Surgeon: Vanna Scotland, MD;  Location: [facility];  Service: Urology;  Laterality: Bilateral;   EYE SURGERY  2015   tissue biopsy   FOOT SURGERY  2015   JOINT REPLACEMENT Left 2013   hip replacement   LITHOTRIPSY     PTOSIS REPAIR Bilateral 02/18/2016   Procedure: BILATERAL PTOSIS REPAIR UPPER EYELIDS;  Surgeon: Imagene Riches, MD;  Location: [facility];  Service: Ophthalmology;   Laterality: Bilateral;  LEAVE PT EARLY AM   thumb surgery Right    TONSILLECTOMY  1973   TOTAL HIP ARTHROPLASTY Right 07/02/2021   Procedure: TOTAL HIP ARTHROPLASTY ANTERIOR APPROACH;  Surgeon: Ollen Gross, MD;  Location: [facility];  Service: Orthopedics;  Laterality: Right;    Family Psychiatric History:   Family History:  Family History  Problem Relation Age of Onset   Cancer Mother    Heart disease Mother    Diabetes Mother    Sleep apnea Mother    Cancer Father        Abdomen with mastasis   High Cholesterol Father    Kidney disease Neg Hx    Bladder Cancer Neg Hx    Prostate cancer Neg Hx    Kidney cancer Neg Hx     Social History:   Social History   Socioeconomic History   Marital status: Divorced    Spouse name: Not on file   Number of children: 1   Years of education: Not on file   Highest education level: Bachelor's degree (e.g., BA, AB, BS)  Occupational History   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   Occupation: Disability  Tobacco Use   Smoking status: Former    Current packs/day: 0.00    Average packs/day: 1 pack/day for 25.0 years (25.0 ttl pk-yrs)    Types: Cigarettes    Start date: 60    Quit date: 2019    Years since quitting: 5.8    Passive exposure: Past   Smokeless tobacco: Never   Tobacco comments:    occasional use  Vaping Use   Vaping status: Former  Substance and Sexual Activity   Alcohol use: No    Alcohol/week: 0.0 standard drinks of alcohol   Drug use: Yes    Types: Methylphenidate, Marijuana    Comment: Delta 8 - once per day   Sexual activity: Never  Other Topics Concern   Not on file  Social History Narrative   Not on file   Social Determinants of Health   Financial Resource Strain: High Risk (07/29/2023)   Overall Financial Resource Strain (CARDIA)    Difficulty of Paying Living Expenses: Hard  Food Insecurity: Food Insecurity Present (07/29/2023)   Hunger Vital Sign    Worried About  Running Out of Food in the Last Year: Sometimes true    Ran Out of Food in the Last Year: Sometimes true  Transportation Needs: No Transportation Needs (07/29/2023)   PRAPARE - Administrator, Civil Service (Medical): No    Lack of Transportation (Non-Medical): No  Physical Activity: Inactive (07/29/2023)   Exercise Vital Sign    Days of Exercise per Week: 0 days    Minutes of Exercise per Session: 20 min  Stress: Stress Concern Present (07/29/2023)   [place]  Institute of Occupational Health - Occupational Stress Questionnaire    Feeling of Stress : Very much  Social Connections: Socially Isolated (07/29/2023)   Social Connection and Isolation Panel [NHANES]    Frequency of Communication with Friends and Family: Once a week    Frequency of Social Gatherings with Friends and Family: Never    Attends Religious Services: Never    Database administrator or Organizations: No    Attends Banker Meetings: Never    Marital Status: Divorced    Additional Social History:   Allergies:   Allergies  Allergen Reactions   Albuterol Shortness Of Breath and Other (See Comments)    Makes pt feel jittery/ tacycardic   Crestor [Rosuvastatin] Other (See Comments)    Joint pain, muscle pain, and hair loss   Halcion [Triazolam] Other (See Comments)    Dizziness,headaches,bladder problems   Levaquin [Levofloxacin In D5w] Diarrhea and Itching    Shoulder pain   Metformin And Related Rash   Naproxen Sodium     Patient tolerates in small doses. Her reaction is swelling of the lower extremities.    Ozempic (0.25 Or 0.5 Mg-Dose) [Semaglutide(0.25 Or 0.5mg -Dos)] Itching   Cefaclor Other (See Comments)    Doesn't remember---unsure if actually allergic    Prednisone     Elevates blood sugars   Sulfa Antibiotics Itching    Unsure of reaction possibly itching   Tramadol Itching and Nausea And Vomiting   Zinc Other (See Comments)    constipation  constipation    Aripiprazole Other  (See Comments)    Muscle tension/cramping   Diclofenac Sodium Rash    "made very sick"    Metabolic Disorder Labs: Lab Results  Component Value Date   HGBA1C 6.6 (H) 03/23/2023   MPG 142.72 07/02/2021   No results found for: "PROLACTIN" Lab Results  Component Value Date   CHOL 122 03/23/2023   TRIG 155 (H) 03/23/2023   HDL 39 (L) 03/23/2023   CHOLHDL 4 08/20/2022   VLDL 39.2 08/20/2022   LDLCALC 56 03/23/2023   LDLCALC 67 08/20/2022     Current Medications: Current Outpatient Medications  Medication Sig Dispense Refill   aspirin EC 81 MG tablet Take 81 mg by mouth at bedtime. Swallow whole.     Azelastine HCl 137 MCG/SPRAY SOLN PLACE 2 SPRAYS INTO BOTH NOSTRILS 2 (TWO) TIMES DAILY 30 mL 11   B Complex-Biotin-FA (BIG 100, BIOTIN, PO) Take 300 mg by mouth at bedtime.     bisacodyl (DULCOLAX) 5 MG EC tablet Take 5 mg by mouth daily as needed for moderate constipation.     cetirizine (ZYRTEC) 10 MG tablet Take 10 mg by mouth daily.     clindamycin (CLEOCIN T) 1 % external solution Apply to scalp once or twice a day prn bumps 30 mL 2   Continuous Glucose Sensor (FREESTYLE LIBRE 3 SENSOR) MISC PLACE 1 SENSOR ON THE SKIN EVERY 14 DAYS. USE TO CHECK GLUCOSE CONTINUOUSLY 6 each 12   cyanocobalamin (VITAMIN B12) 1000 MCG/ML injection INJECT 1 ML (1,000 MCG TOTAL) INTO THE MUSCLE EVERY 30 (THIRTY) DAYS. FOR B12 VITAMIN 3 mL 3   diphenhydrAMINE (BENADRYL) 2 % cream Apply topically 3 (three) times daily as needed for itching.     Dulaglutide (TRULICITY) 3 MG/0.5ML SOPN Inject 3 mg into the skin once a week. 2 mL 1   estradiol (ESTRACE) 0.1 MG/GM vaginal cream Estrogen Cream Instruction Discard applicator Apply pea sized amount to tip of finger to urethra  before bed. Wash hands well after application. Use Monday, Wednesday and Friday 42.5 g 11   Evolocumab (REPATHA SURECLICK) 140 MG/ML SOAJ Inject 140 mg into the skin every 14 (fourteen) days. PLEASE CALL OFFICE TO SCHEDULE APPOINTMENT PRIOR  TO NEXT REFILL 2 mL 12   fenofibrate (TRICOR) 145 MG tablet Take 1 tablet (145 mg total) by mouth daily. For cholesterol. Please call [phone] for an appointment. 90 tablet 3   hydrOXYzine (VISTARIL) 50 MG capsule Take 2 capsules (100 mg total) by mouth at bedtime. TAKE 1 CAPSLE BY MOUTH AT NOON, 1 AT 6 PM, AND 1 AS NEEDED 180 capsule 0   ibuprofen (ADVIL) 200 MG tablet Take 800 mg by mouth 2 (two) times daily.     ketoconazole (NIZORAL) 2 % shampoo Apply 1 Application topically as directed. Wash from waist up 3 times a week for 2 months, then decrease to 1 time a month, let sit 5 minutes and rinse off 120 mL 6   levalbuterol (XOPENEX HFA) 45 MCG/ACT inhaler Inhale 2 puffs into the lungs every 4 (four) hours as needed for wheezing.     Lysine 500 MG TABS Take 1,000 mg by mouth daily.     metFORMIN (GLUCOPHAGE) 500 MG tablet Take 2 tabs by mouth every morning and 1 tab in the evening 270 tablet 3   methocarbamol (ROBAXIN) 500 MG tablet TAKE 1-2 TABLETS (500-1,000 MG TOTAL) BY MOUTH AT BEDTIME AS NEEDED FOR MUSCLE SPASMS. 180 tablet 0   metoprolol tartrate (LOPRESSOR) 50 MG tablet Take 1 tablet (50 mg total) by mouth 2 (two) times daily. 180 tablet 3   montelukast (SINGULAIR) 10 MG tablet TAKE 1 TABLET BY MOUTH EVERYDAY AT BEDTIME 90 tablet 1   mupirocin ointment (BACTROBAN) 2 % Apply 1 Application topically daily. Qd to wound on finger 22 g 1   neomycin-bacitracin-polymyxin (NEOSPORIN) [phone] ointment Apply 1 application topically 4 (four) times daily as needed (for cut/scrapes.).     NEOMYCIN-POLYMYXIN-HYDROCORTISONE (CORTISPORIN) 1 % SOLN OTIC solution Apply 1-2 drops to toe BID after soaking 10 mL 1   NON FORMULARY Garden of life women's probiotics     pantoprazole (PROTONIX) 40 MG tablet TAKE 1 TABLET (40 MG TOTAL) BY MOUTH 2 (TWO) TIMES DAILY BEFORE A MEAL. FOR HEARTBURN 180 tablet 3   polyvinyl alcohol (LIQUIFILM TEARS) 1.4 % ophthalmic solution Place 2 drops into both eyes at bedtime.      Probiotic Product (PROBIOTIC BLEND PO) Take by mouth.     Roflumilast (ZORYVE) 0.3 % CREA Apply 1 application  topically daily. qd to aa psoriasis on legs, arms 60 g 4   Ruxolitinib Phosphate (OPZELURA) 1.5 % CREA Apply to aa's rash QD PRN. 60 g 3   STIOLTO RESPIMAT 2.5-2.5 MCG/ACT AERS Inhale 2 puffs into the lungs at bedtime.     temazepam (RESTORIL) 30 MG capsule TAKE ONE TO TWO CAPSULES BY MOUTH NIGHTLY AT BEDTIME 180 capsule 0   traZODone (DESYREL) 50 MG tablet Take 2 tablets (100 mg total) by mouth at bedtime. 180 tablet 0   valACYclovir (VALTREX) 1000 MG tablet Take 2 tablets by mouth twice daily for 1 day as needed for herpes outbreak. 12 tablet 0   Vitamin D3 (VITAMIN D) 25 MCG tablet Take 3,000 Units by mouth daily.     desipramine (NORPRAMIN) 25 MG tablet 2  qam 180 tablet 0   DULoxetine (CYMBALTA) 20 MG capsule Take 2 capsules (40 mg total) by mouth daily. 180 capsule 0   lamoTRIgine (  LAMICTAL) 150 MG tablet Take 2 tablets (300 mg total) by mouth daily at bedtime. 180 tablet 0   No current facility-administered medications for this visit.    Neurologic: Headache: No Seizure: No Paresthesias:No  Musculoskeletal: Strength & Muscle Tone: within normal limits Gait & Station: normal Patient leans: N/A  Psychiatric Specialty Exam: ROS  Blood pressure 120/75, pulse 91, height 5\' 6"  (1.676 m), weight 239 lb (108.4 kg), last menstrual period 08/23/2014.Body mass index is 38.58 kg/m.  General Appearance: Casual  Eye Contact:  Good  Speech:  Clear and Coherent  Volume:  Normal  Mood:  Negative  Affect:  Appropriate  Thought Process:  Goal Directed  Orientation:  NA  Thought Content:  Logical  Suicidal Thoughts:  No  Homicidal Thoughts:  No  Memory:  Negative  Judgement:  Good  Insight:  Good  Psychomotor Activity:  Normal  Concentration:    Recall:    Fund of Knowledge:Good  Language: Good  Akathisia:  No  Handed:  Right  AIMS (if indicated):    Assets:  Desire  for Improvement  ADL's:  Intact  Cognition: WNL  Sleep:      11/5/20244:37 PM    Today the patient is at her baseline.  Her first diagnosis is major depression.  She takes Cymbalta 40 mg and desipramine 50 mg.  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  She continues taking Lamictal as a mood stabilizer.  Her third problem is insomnia.  She will continue taking Restoril 60 mg and today we will increase her trazodone from 100 mg up to 200 mg.  Will give her 100 mg pill and give her 2.  The patient is not suicidal.  She is functioning fairly well.  The good news that [facility].  We been wanting her to do this for over a year.

## 2023-09-22 ENCOUNTER — Ambulatory Visit: Payer: PPO | Admitting: Podiatry

## 2023-09-23 ENCOUNTER — Other Ambulatory Visit (HOSPITAL_COMMUNITY): Payer: Self-pay | Admitting: Psychiatry

## 2023-09-23 ENCOUNTER — Other Ambulatory Visit (HOSPITAL_COMMUNITY): Payer: Self-pay | Admitting: *Deleted

## 2023-09-23 MED ORDER — TEMAZEPAM 30 MG PO CAPS: ORAL_CAPSULE | ORAL | 0 refills | Status: DC

## 2023-09-27 ENCOUNTER — Ambulatory Visit: Payer: PPO | Admitting: Urology

## 2023-09-27 ENCOUNTER — Ambulatory Visit: Payer: PPO | Admitting: Physician Assistant

## 2023-09-29 ENCOUNTER — Other Ambulatory Visit (HOSPITAL_COMMUNITY): Payer: Self-pay | Admitting: *Deleted

## 2023-09-29 ENCOUNTER — Other Ambulatory Visit (HOSPITAL_COMMUNITY): Payer: Self-pay | Admitting: Psychiatry

## 2023-09-29 MED ORDER — TRAZODONE HCL 100 MG PO TABS: 200.0000 mg | ORAL_TABLET | Freq: Every day | ORAL | 2 refills | Status: DC

## 2023-09-29 MED ORDER — TEMAZEPAM 30 MG PO CAPS: ORAL_CAPSULE | ORAL | 0 refills | Status: DC

## 2023-10-08 ENCOUNTER — Other Ambulatory Visit (HOSPITAL_COMMUNITY): Payer: Self-pay | Admitting: *Deleted

## 2023-10-08 ENCOUNTER — Ambulatory Visit: Payer: PPO | Admitting: Physician Assistant

## 2023-10-08 ENCOUNTER — Encounter (HOSPITAL_COMMUNITY): Payer: Self-pay

## 2023-10-08 MED ORDER — HYDROXYZINE PAMOATE 50 MG PO CAPS: 100.0000 mg | ORAL_CAPSULE | Freq: Every day | ORAL | 0 refills | Status: DC

## 2023-10-12 DIAGNOSIS — G35 Multiple sclerosis: Secondary | ICD-10-CM | POA: Diagnosis not present

## 2023-10-12 DIAGNOSIS — E538 Deficiency of other specified B group vitamins: Secondary | ICD-10-CM | POA: Diagnosis not present

## 2023-10-12 DIAGNOSIS — L4050 Arthropathic psoriasis, unspecified: Secondary | ICD-10-CM | POA: Diagnosis not present

## 2023-10-12 DIAGNOSIS — D509 Iron deficiency anemia, unspecified: Secondary | ICD-10-CM | POA: Diagnosis not present

## 2023-10-12 DIAGNOSIS — G63 Polyneuropathy in diseases classified elsewhere: Secondary | ICD-10-CM | POA: Diagnosis not present

## 2023-10-12 DIAGNOSIS — B009 Herpesviral infection, unspecified: Secondary | ICD-10-CM | POA: Diagnosis not present

## 2023-10-12 DIAGNOSIS — E11649 Type 2 diabetes mellitus with hypoglycemia without coma: Secondary | ICD-10-CM | POA: Diagnosis not present

## 2023-10-12 DIAGNOSIS — F319 Bipolar disorder, unspecified: Secondary | ICD-10-CM | POA: Diagnosis not present

## 2023-10-12 DIAGNOSIS — E1169 Type 2 diabetes mellitus with other specified complication: Secondary | ICD-10-CM | POA: Diagnosis not present

## 2023-10-12 DIAGNOSIS — M3501 Sicca syndrome with keratoconjunctivitis: Secondary | ICD-10-CM | POA: Diagnosis not present

## 2023-10-12 DIAGNOSIS — J449 Chronic obstructive pulmonary disease, unspecified: Secondary | ICD-10-CM | POA: Diagnosis not present

## 2023-10-18 ENCOUNTER — Telehealth (INDEPENDENT_AMBULATORY_CARE_PROVIDER_SITE_OTHER): Payer: PPO | Admitting: Psychology

## 2023-10-19 DIAGNOSIS — G4733 Obstructive sleep apnea (adult) (pediatric): Secondary | ICD-10-CM | POA: Diagnosis not present

## 2023-10-21 ENCOUNTER — Other Ambulatory Visit (HOSPITAL_COMMUNITY): Payer: Self-pay | Admitting: Psychiatry

## 2023-10-22 ENCOUNTER — Ambulatory Visit: Payer: PPO | Admitting: Physician Assistant

## 2023-10-28 ENCOUNTER — Ambulatory Visit: Payer: PPO

## 2023-10-28 VITALS — Ht 69.0 in | Wt 238.0 lb

## 2023-10-28 DIAGNOSIS — Z0000 Encounter for general adult medical examination without abnormal findings: Secondary | ICD-10-CM | POA: Diagnosis not present

## 2023-10-28 DIAGNOSIS — E1165 Type 2 diabetes mellitus with hyperglycemia: Secondary | ICD-10-CM

## 2023-10-28 NOTE — Progress Notes (Signed)
Subjective:   Emily Moon is a 62 y.o. female who presents for Medicare Annual (Subsequent) preventive examination.  Visit Complete: Virtual I connected with  Emily Moon on 10/28/23 by a audio enabled telemedicine application and verified that I am speaking with the correct person using two identifiers.  Patient Location: Home  Provider Location: Home Office  I discussed the limitations of evaluation and management by telemedicine. The patient expressed understanding and agreed to proceed.  Vital Signs: Because this visit was a virtual/telehealth visit, some criteria may be missing or patient reported. Any vitals not documented were not able to be obtained and vitals that have been documented are patient reported.  Patient Medicare AWV questionnaire was completed by the patient on 10/26/23; I have confirmed that all information answered by patient is correct and no changes since this date. Cardiac Risk Factors include: advanced age (>53men, >41 women);dyslipidemia;sedentary lifestyle;obesity (BMI >30kg/m2)    Objective:    Today's Vitals   10/26/23 0503 10/28/23 1518  Weight:  238 lb (108 kg)  Height:  5\' 9"  (1.753 m)  PainSc: 5     Body mass index is 35.15 kg/m.     10/28/2023    3:49 PM 06/17/2023    2:21 PM 12/15/2022    3:23 PM 10/01/2022    3:26 PM 03/23/2022    1:13 PM 03/23/2022   11:47 AM 01/15/2022    2:58 PM  Advanced Directives  Does Patient Have a Medical Advance Directive? Yes No Yes Yes  No No  Type of Estate agent of [place];Living will   Healthcare Power of [place];Living will     Does patient want to make changes to medical advance directive?    No - Patient declined     Copy of Healthcare Power of Attorney in Chart? No - copy requested   Yes - validated most recent copy scanned in chart (See row information)     Would patient like information on creating a medical advance directive?  No - Patient declined   No - Patient  declined  No - Patient declined    Current Medications (verified) Outpatient Encounter Medications as of 10/28/2023  Medication Sig   aspirin EC 81 MG tablet Take 81 mg by mouth at bedtime. Swallow whole.   Azelastine HCl 137 MCG/SPRAY SOLN PLACE 2 SPRAYS INTO BOTH NOSTRILS 2 (TWO) TIMES DAILY   B Complex-Biotin-FA (BIG 100, BIOTIN, PO) Take 300 mg by mouth at bedtime.   bisacodyl (DULCOLAX) 5 MG EC tablet Take 5 mg by mouth daily as needed for moderate constipation.   cetirizine (ZYRTEC) 10 MG tablet Take 10 mg by mouth daily.   clindamycin (CLEOCIN T) 1 % external solution Apply to scalp once or twice a day prn bumps   Continuous Glucose Sensor (FREESTYLE LIBRE 3 SENSOR) MISC PLACE 1 SENSOR ON THE SKIN EVERY 14 DAYS. USE TO CHECK GLUCOSE CONTINUOUSLY   cyanocobalamin (VITAMIN B12) 1000 MCG/ML injection INJECT 1 ML (1,000 MCG TOTAL) INTO THE MUSCLE EVERY 30 (THIRTY) DAYS. FOR B12 VITAMIN   desipramine (NORPRAMIN) 25 MG tablet 2  qam   diphenhydrAMINE (BENADRYL) 2 % cream Apply topically 3 (three) times daily as needed for itching.   Dulaglutide (TRULICITY) 3 MG/0.5ML SOPN Inject 3 mg into the skin once a week.   DULoxetine (CYMBALTA) 20 MG capsule Take 2 capsules (40 mg total) by mouth daily.   estradiol (ESTRACE) 0.1 MG/GM vaginal cream Estrogen Cream Instruction Discard applicator Apply pea sized amount  to tip of finger to urethra before bed. Wash hands well after application. Use Monday, Wednesday and Friday   Evolocumab (REPATHA SURECLICK) 140 MG/ML SOAJ Inject 140 mg into the skin every 14 (fourteen) days. PLEASE CALL OFFICE TO SCHEDULE APPOINTMENT PRIOR TO NEXT REFILL   fenofibrate (TRICOR) 145 MG tablet Take 1 tablet (145 mg total) by mouth daily. For cholesterol. Please call [phone] for an appointment.   hydrOXYzine (VISTARIL) 50 MG capsule Take 2 capsules (100 mg total) by mouth at bedtime. TAKE 1 CAPSLE BY MOUTH AT NOON, 1 AT 6 PM, AND 1 AS NEEDED   ibuprofen (ADVIL) 200 MG  tablet Take 800 mg by mouth 2 (two) times daily.   ketoconazole (NIZORAL) 2 % shampoo Apply 1 Application topically as directed. Wash from waist up 3 times a week for 2 months, then decrease to 1 time a month, let sit 5 minutes and rinse off   lamoTRIgine (LAMICTAL) 150 MG tablet Take 2 tablets (300 mg total) by mouth daily at bedtime.   levalbuterol (XOPENEX HFA) 45 MCG/ACT inhaler Inhale 2 puffs into the lungs every 4 (four) hours as needed for wheezing.   Lysine 500 MG TABS Take 1,000 mg by mouth daily.   metFORMIN (GLUCOPHAGE) 500 MG tablet Take 2 tabs by mouth every morning and 1 tab in the evening   methocarbamol (ROBAXIN) 500 MG tablet TAKE 1-2 TABLETS (500-1,000 MG TOTAL) BY MOUTH AT BEDTIME AS NEEDED FOR MUSCLE SPASMS.   metoprolol tartrate (LOPRESSOR) 50 MG tablet Take 1 tablet (50 mg total) by mouth 2 (two) times daily.   montelukast (SINGULAIR) 10 MG tablet TAKE 1 TABLET BY MOUTH EVERYDAY AT BEDTIME   mupirocin ointment (BACTROBAN) 2 % Apply 1 Application topically daily. Qd to wound on finger   neomycin-bacitracin-polymyxin (NEOSPORIN) [phone] ointment Apply 1 application topically 4 (four) times daily as needed (for cut/scrapes.).   NEOMYCIN-POLYMYXIN-HYDROCORTISONE (CORTISPORIN) 1 % SOLN OTIC solution Apply 1-2 drops to toe BID after soaking   NON FORMULARY Garden of life women's probiotics   pantoprazole (PROTONIX) 40 MG tablet TAKE 1 TABLET (40 MG TOTAL) BY MOUTH 2 (TWO) TIMES DAILY BEFORE A MEAL. FOR HEARTBURN   polyvinyl alcohol (LIQUIFILM TEARS) 1.4 % ophthalmic solution Place 2 drops into both eyes at bedtime.   Probiotic Product (PROBIOTIC BLEND PO) Take by mouth.   Roflumilast (ZORYVE) 0.3 % CREA Apply 1 application  topically daily. qd to aa psoriasis on legs, arms   Ruxolitinib Phosphate (OPZELURA) 1.5 % CREA Apply to aa's rash QD PRN.   STIOLTO RESPIMAT 2.5-2.5 MCG/ACT AERS Inhale 2 puffs into the lungs at bedtime.   temazepam (RESTORIL) 30 MG capsule TAKE ONE 30 mg  capsule at bedtime.   traZODone (DESYREL) 100 MG tablet Take 2 tablets (200 mg total) by mouth at bedtime.   valACYclovir (VALTREX) 1000 MG tablet Take 2 tablets by mouth twice daily for 1 day as needed for herpes outbreak.   Vitamin D3 (VITAMIN D) 25 MCG tablet Take 3,000 Units by mouth daily.   No facility-administered encounter medications on file as of 10/28/2023.    Allergies (verified) Albuterol, Crestor [rosuvastatin], Halcion [triazolam], Levaquin [levofloxacin in d5w], Metformin and related, Naproxen sodium, Ozempic (0.25 or 0.5 mg-dose) [semaglutide(0.25 or 0.5mg -dos)], Cefaclor, Prednisone, Sulfa antibiotics, Tramadol, Zinc, Aripiprazole, and Diclofenac sodium   History: Past Medical History:  Diagnosis Date   Acute pyelonephritis 12/25/2021   Arthritis    osteo   Asthma    Bipolar disorder (HCC) 05/21/2014   Cataracts, bilateral  Cervical radiculitis    Chest pain    Chronic pain syndrome    COPD (chronic obstructive pulmonary disease) (HCC)    DDD (degenerative disc disease), cervical    also back   Depression    Diabetes mellitus without complication (HCC)    Dizziness    Positional   Edema    feet/legs   Epigastric pain    Fibromyalgia syndrome    Fungal infection    Finger nails   GERD (gastroesophageal reflux disease)    Gout    Heart palpitations    Hip dysplasia, 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   History of kidney stones    Hx of sepsis    Hypercholesterolemia    IDA (iron deficiency anemia)    Multiple sclerosis (HCC)    weakness   Osteoporosis    osteoarthritis   Pneumonia    PONV (postoperative nausea and vomiting)    no problem after cataract surgery   Prediabetes 07/22/2018   Psoriasis    Sciatica    Seasonal allergies    Shortness of breath dyspnea    wheezing   Sleep apnea 2012   sleep study / slight, no interventions   Urinary frequency    Vitamin B 12 deficiency    Weight gain 06/21/2014   Past Surgical History:  Procedure Laterality  Date   CATARACT EXTRACTION W/PHACO Left 05/21/2015   Procedure: CATARACT EXTRACTION PHACO AND INTRAOCULAR LENS PLACEMENT (IOC);  Surgeon: Galen Manila, MD;  Location: [facility];  Service: Ophthalmology;  Laterality: Left;  [place] 00:35 AP% 22.9 CDE 8.11 fluid pack lot #[id number]   CATARACT EXTRACTION W/PHACO Right 06/04/2015   Procedure: CATARACT EXTRACTION PHACO AND INTRAOCULAR LENS PLACEMENT (IOC);  Surgeon: Galen Manila, MD;  Location: [facility];  Service: Ophthalmology;  Laterality: Right;  US:00:48 AP%: 10.5 CDE:5.08 Fluid lot #[id number]   CYSTOSCOPY/URETEROSCOPY/HOLMIUM LASER/STENT PLACEMENT Bilateral 09/22/2016   Procedure: CYSTOSCOPY/URETEROSCOPY/HOLMIUM LASER/STENT PLACEMENT;  Surgeon: Vanna Scotland, MD;  Location: [facility];  Service: Urology;  Laterality: Bilateral;   EYE SURGERY  2015   tissue biopsy   FOOT SURGERY  2015   JOINT REPLACEMENT Left 2013   hip replacement   LITHOTRIPSY     PTOSIS REPAIR Bilateral 02/18/2016   Procedure: BILATERAL PTOSIS REPAIR UPPER EYELIDS;  Surgeon: Imagene Riches, MD;  Location: [facility];  Service: Ophthalmology;  Laterality: Bilateral;  LEAVE PT EARLY AM   thumb surgery Right    TONSILLECTOMY  1973   TOTAL HIP ARTHROPLASTY Right 07/02/2021   Procedure: TOTAL HIP ARTHROPLASTY ANTERIOR APPROACH;  Surgeon: Ollen Gross, MD;  Location: [facility];  Service: Orthopedics;  Laterality: Right;   Family History  Problem Relation Age of Onset   Cancer Mother    Heart disease Mother    Diabetes Mother    Sleep apnea Mother    Cancer Father        Abdomen with mastasis   High Cholesterol Father    Kidney disease Neg Hx    Bladder Cancer Neg Hx    Prostate cancer Neg Hx    Kidney cancer Neg Hx    Social History   Socioeconomic History   Marital status: Divorced    Spouse name: Not on file   Number of children: 1   Years of education: Not on file   Highest education level: Bachelor's degree (e.g., BA, AB, BS)  Occupational History    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   Occupation: Disability  Tobacco Use  Smoking status: Former    Current packs/day: 0.00    Average packs/day: 1 pack/day for 25.0 years (25.0 ttl pk-yrs)    Types: Cigarettes    Start date: 34    Quit date: 2019    Years since quitting: 5.9    Passive exposure: Past   Smokeless tobacco: Never   Tobacco comments:    occasional use  Vaping Use   Vaping status: Former  Substance and Sexual Activity   Alcohol use: No    Alcohol/week: 0.0 standard drinks of alcohol   Drug use: Yes    Types: Methylphenidate, Marijuana    Comment: Delta 8 - once per day   Sexual activity: Never  Other Topics Concern   Not on file  Social History Narrative   Not on file   Social Drivers of Health   Financial Resource Strain: Medium Risk (10/26/2023)   Overall Financial Resource Strain (CARDIA)    Difficulty of Paying Living Expenses: Somewhat hard  Food Insecurity: Food Insecurity Present (10/26/2023)   Hunger Vital Sign    Worried About Running Out of Food in the Last Year: Often true    Ran Out of Food in the Last Year: Often true  Transportation Needs: No Transportation Needs (10/26/2023)   PRAPARE - Administrator, Civil Service (Medical): No    Lack of Transportation (Non-Medical): No  Physical Activity: Inactive (10/28/2023)   Exercise Vital Sign    Days of Exercise per Week: 0 days    Minutes of Exercise per Session: 0 min  Stress: Stress Concern Present (10/26/2023)   [employer] of Occupational Health - Occupational Stress Questionnaire    Feeling of Stress : Very much  Social Connections: Socially Isolated (10/26/2023)   Social Connection and Isolation Panel [NHANES]    Frequency of Communication with Friends and Family: Once a week    Frequency of Social Gatherings with Friends and Family: Never    Attends Religious Services: Never    Database administrator or Organizations: No    Attends  Engineer, structural: Never    Marital Status: Divorced    Tobacco Counseling Counseling given: Not Answered Tobacco comments: occasional use  Clinical Intake:  Pre-visit preparation completed: Yes  Pain : 0-10 Pain Score: 5  Pain Type: Chronic pain Pain Location: Generalized Pain Descriptors / Indicators: Aching, Burning Pain Onset: More than a month ago Pain Frequency: Constant Pain Relieving Factors: medications,  Pain Relieving Factors: medications,  BMI - recorded: 35.15 Nutritional Status: BMI > 30  Obese Nutritional Risks: None Diabetes: Yes Did pt. bring in CBG monitor from home?: No  How often do you need to have someone help you when you read instructions, pamphlets, or other written materials from your doctor or pharmacy?: 1 - Never  Interpreter Needed?: No  Comments: lives alone Information entered by :: B.Kathreen Dileo,LPN   Activities of Daily Living    10/26/2023    5:03 AM 08/27/2023   10:56 AM  In your present state of health, do you have any difficulty performing the following activities:  Hearing? 0 0  Vision? 1 0  Difficulty concentrating or making decisions? 1 0  Walking or climbing stairs? 1   Dressing or bathing? 1   Doing errands, shopping? 0   Preparing Food and eating ? Y   Using the Toilet? N   Do you have problems with loss of bowel control? N   Managing your Medications? Y   Managing your Finances? Jeannie Fend  Housekeeping or managing your Housekeeping? Y     Patient Care Team: Karie Schwalbe, MD as PCP - General (Internal Medicine) Debbe Odea, MD as PCP - Cardiology (Cardiology) Galen Manila, MD as Referring Physician (Ophthalmology) Kathyrn Sheriff, [facility] (Inactive) as Pharmacist (Pharmacist) Jeralyn Ruths, MD as Consulting Physician (Hematology and Oncology)  Indicate any recent Medical Services you may have received from other than [facility] providers in the past year (date may be approximate).      Assessment:   This is a routine wellness examination for [place].  Hearing/Vision screen Hearing Screening - Comments:: Pt says her hearing is good Vision Screening - Comments:: Pt says her vision is worsening Dr Milana Kidney make appt   Goals Addressed             This Visit's Progress    DIET - EAT MORE FRUITS AND VEGETABLES   Not on track    Manage My Medicine   On track    Timeframe:  Long-Range Goal Priority:  High Start Date:     09/10/21                    Expected End Date:     09/10/22                  Follow Up Date May 2023   - call for medicine refill 2 or 3 days before it runs out - call if I am sick and can't take my medicine - keep a list of all the medicines I take; vitamins and herbals too - use a pillbox to sort medicine  -collaborate with provider on medication access solutions (Trulicity, Repatha, Stiolto)   Why is this important?   These steps will help you keep on track with your medicines.   Notes:      Patient Stated   Not on track    04/05/2020, I will maintain and continue medications as prescribed.      COMPLETED: Patient Stated   Not on track    06/03/2021, I will maintain and continue medications as prescribed.       Depression Screen    10/28/2023    3:42 PM 12/03/2022    3:16 PM 10/01/2022    3:22 PM 08/20/2022    2:04 PM 04/01/2022    2:49 PM 06/03/2021    2:18 PM 04/23/2021    2:10 PM  PHQ 2/9 Scores  PHQ - 2 Score 6 6 2 6  6 6   PHQ- 9 Score 19 20 5 18  15 15   Exception Documentation     Patient refusal      Fall Risk    10/26/2023    5:03 AM 12/03/2022    3:16 PM 10/01/2022    3:29 PM 10/01/2022    1:44 PM 06/03/2021    2:08 PM  Fall Risk   Falls in the past year? 0 1 1 1  1   Number falls in past yr:  0 1 1  1   Injury with Fall? 0 0 1 1  1   Risk for fall due to :  No Fall Risks History of fall(s)  Impaired balance/gait;Impaired mobility;Medication side effect  Follow up  Falls evaluation completed Falls prevention  discussed;Falls evaluation completed  Falls evaluation completed;Falls prevention discussed     Patient-reported    MEDICARE RISK AT HOME: Medicare Risk at Home Any stairs in or around the home?: No Home free of loose throw rugs in walkways, pet  beds, electrical cords, etc?: No Adequate lighting in your home to reduce risk of falls?: Yes Life alert?: No Use of a cane, walker or w/c?: Yes Grab bars in the bathroom?: Yes Shower chair or bench in shower?: Yes Elevated toilet seat or a handicapped toilet?: No  TIMED UP AND GO:  Was the test performed?  No    Cognitive Function:    06/03/2021    2:10 PM 04/05/2020    3:12 PM  MMSE - Mini Mental State Exam  Not completed: Refused Refused        10/28/2023    3:57 PM  6CIT Screen  What Year? 0 points  What month? 0 points  What time? 0 points  Count back from 20 0 points  Months in reverse 0 points  Repeat phrase 4 points  Total Score 4 points    Immunizations Immunization History  Administered Date(s) Administered   Influenza Split 08/05/2011   Influenza, Seasonal, Injecte, Preservative Fre 06/26/2018   Influenza,inj,Quad PF,6+ Mos 08/17/2013, 07/13/2015, 07/06/2017, 06/26/2018, 06/26/2019, 07/04/2020, 09/03/2021   Influenza-Unspecified 07/08/2016, 06/18/2017, 06/26/2018, 08/06/2022   PFIZER(Purple Top)SARS-COV-2 Vaccination 02/06/2020, 02/28/2020, 07/01/2020, 03/19/2021   Pfizer Covid-19 Vaccine Bivalent Booster 62yrs & up 09/03/2021   Pneumococcal Conjugate-13 07/05/2014   Pneumococcal Polysaccharide-23 07/13/2015    TDAP status: Up to date  Flu Vaccine status: Up to date  Pneumococcal vaccine status: Up to date  Covid-19 vaccine status: Completed vaccines  Qualifies for Shingles Vaccine? Yes   Zostavax completed No   Shingrix Completed?: No.    Education has been provided regarding the importance of this vaccine. Patient has been advised to call insurance company to determine out of pocket expense if they  have not yet received this vaccine. Advised may also receive vaccine at local pharmacy or Health Dept. Verbalized acceptance and understanding.  Screening Tests Health Maintenance  Topic Date Due   Diabetic kidney evaluation - Urine ACR  08/21/2023   HEMOGLOBIN A1C  09/23/2023   COVID-19 Vaccine (6 - 2024-25 season) 11/13/2023 (Originally 07/18/2023)   Zoster Vaccines- Shingrix (1 of 2) 01/26/2024 (Originally [birth year]1981)   INFLUENZA VACCINE  02/14/2024 (Originally 06/17/2023)   Lung Cancer Screening  10/27/2024 (Originally 04/08/2012)   Colonoscopy  10/27/2024 (Originally 04/09/2007)   OPHTHALMOLOGY EXAM  02/01/2024   Diabetic kidney evaluation - eGFR measurement  06/15/2024   FOOT EXAM  08/08/2024   Medicare Annual Wellness (AWV)  10/27/2024   MAMMOGRAM  02/24/2025   Cervical Cancer Screening (HPV/Pap Cotest)  06/04/2026   Hepatitis C Screening  Completed   HIV Screening  Completed   HPV VACCINES  Aged Out   DTaP/Tdap/Td  Discontinued    Health Maintenance  Health Maintenance Due  Topic Date Due   Diabetic kidney evaluation - Urine ACR  08/21/2023   HEMOGLOBIN A1C  09/23/2023    Colorectal cancer screening: Type of screening: Colonoscopy. Completed no. Repeat every 5-10 years pt does not desire  Mammogram status: Completed 02/25/23. Repeat every year  Lung Cancer Screening: (Low Dose CT Chest recommended if Age 2-80 years, 20 pack-year currently smoking OR have quit w/in 15years.) does not qualify.   Lung Cancer Screening Referral: no  Additional Screening:  Hepatitis C Screening: does not qualify; Completed 12/17/2016  Vision Screening: Recommended annual ophthalmology exams for early detection of glaucoma and other disorders of the eye. Is the patient up to date with their annual eye exam?  Yes  Who is the provider or what is the name of the office in which  the patient attends annual eye exams? Dr Druscilla Brownie If pt is not established with a provider, would they like to be  referred to a provider to establish care? No .   Dental Screening: Recommended annual dental exams for proper oral hygiene  Diabetic Foot Exam:   Community Resource Referral / Chronic Care Management: CRR required this visit?  No   CCM required this visit?  No    Plan:     I have personally reviewed and noted the following in the patient's chart:   Medical and social history Use of alcohol, tobacco or illicit drugs  Current medications and supplements including opioid prescriptions. Patient is not currently taking opioid prescriptions. Functional ability and status Nutritional status Physical activity Advanced directives List of other physicians Hospitalizations, surgeries, and ER visits in previous 12 months Vitals Screenings to include cognitive, depression, and falls Referrals and appointments  In addition, I have reviewed and discussed with patient certain preventive protocols, quality metrics, and best practice recommendations. A written personalized care plan for preventive services as well as general preventive health recommendations were provided to patient.   Sue Lush, LPN   [id number]   After Visit Summary: (MyChart) Due to this being a telephonic visit, the after visit summary with patients personalized plan was offered to patient via MyChart   Nurse Notes: pt says she is fasting for 22hrs only eating 5-7pm. She is trying to Trulicity refilled through [place] as she needs it. Pt has not had PE in awhile:scheduled w/PCP.

## 2023-10-28 NOTE — Patient Instructions (Signed)
Emily Moon , Thank you for taking time to come for your Medicare Wellness Visit. I appreciate your ongoing commitment to your health goals. Please review the following plan we discussed and let me know if I can assist you in the future.   Referrals/Orders/Follow-Ups/Clinician Recommendations: none  This is a list of the screening recommended for you and due dates:  Health Maintenance  Topic Date Due   Zoster (Shingles) Vaccine (1 of 2) Never done   Colon Cancer Screening  Never done   Screening for Lung Cancer  04/08/2012   Flu Shot  06/17/2023   COVID-19 Vaccine (6 - 2024-25 season) 07/18/2023   Yearly kidney health urinalysis for diabetes  08/21/2023   Hemoglobin A1C  09/23/2023   Eye exam for diabetics  02/01/2024   Yearly kidney function blood test for diabetes  06/15/2024   Complete foot exam   08/08/2024   Medicare Annual Wellness Visit  10/27/2024   Mammogram  02/24/2025   Pap with HPV screening  06/04/2026   Hepatitis C Screening  Completed   HIV Screening  Completed   HPV Vaccine  Aged Out   DTaP/Tdap/Td vaccine  Discontinued    Advanced directives: (In Chart) A copy of your advanced directives are scanned into your chart should your provider ever need it.  Next Medicare Annual Wellness Visit scheduled for next year: Yes 10/30/24 @ 3:40pm telephone

## 2023-10-29 NOTE — Addendum Note (Signed)
Addended by: Alvina Chou on: 10/29/2023 07:22 AM   Modules accepted: Orders

## 2023-11-01 ENCOUNTER — Ambulatory Visit (INDEPENDENT_AMBULATORY_CARE_PROVIDER_SITE_OTHER): Payer: PPO | Admitting: Podiatry

## 2023-11-01 ENCOUNTER — Ambulatory Visit: Payer: PPO | Admitting: Physician Assistant

## 2023-11-01 DIAGNOSIS — M205X9 Other deformities of toe(s) (acquired), unspecified foot: Secondary | ICD-10-CM

## 2023-11-01 DIAGNOSIS — M205X1 Other deformities of toe(s) (acquired), right foot: Secondary | ICD-10-CM | POA: Diagnosis not present

## 2023-11-01 NOTE — Progress Notes (Signed)
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  We did surgery toes previously and ended up with a small residual mallet toe.  It is flexible.  Objective: Vital signs are stable alert oriented x 3 flexible mallet toe deformity fourth right.  Pulses are palpable.  No neurologic issues.  Assessment: Mallet toe deformity.  Plan: After local anesthetic was administered and informed consent was made we did do a flexor tenotomy after sterile Betadine skin prep with an 18-gauge needle at the level of the DIPJ.  Tolerated procedure well.  Was dressed dressed a compressive dressing and placed in a Darco shoe was given both oral written we will instructions I will follow-up with her in about 2 weeks

## 2023-11-01 NOTE — Patient Instructions (Signed)
Leave bandage in place and dry for 4 days, then remove. You may wash foot normally after removal of bandage. DO NOT SOAK FOOT! Dry completely afterwards and may use a bandaid over incision if needed. We will follow up with you in 1 weeks for recheck.  

## 2023-11-05 ENCOUNTER — Encounter: Payer: Self-pay | Admitting: Internal Medicine

## 2023-11-05 NOTE — Telephone Encounter (Signed)
Hi, can you help her get her Trulicity patient assistance stuff going? I do not want to get it messed up.

## 2023-11-07 ENCOUNTER — Encounter (HOSPITAL_COMMUNITY): Payer: Self-pay

## 2023-11-08 ENCOUNTER — Encounter: Payer: Self-pay | Admitting: Pharmacist

## 2023-11-08 ENCOUNTER — Encounter: Payer: PPO | Admitting: Internal Medicine

## 2023-11-08 DIAGNOSIS — E1165 Type 2 diabetes mellitus with hyperglycemia: Secondary | ICD-10-CM

## 2023-11-08 MED ORDER — TEMAZEPAM 30 MG PO CAPS: 60.0000 mg | ORAL_CAPSULE | Freq: Every evening | ORAL | 0 refills | Status: DC | PRN

## 2023-11-08 NOTE — Progress Notes (Addendum)
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  MyChart message sent to patient informing her to enroll online (or to respond to MyChart message with household size and income and I will fill form out for her online).  12/24: Patient household size 1, SSI $2362. Patient portion submitted online. [employer] Case Number: [id number] Income Documentation: N/A - Electronic verification elected.  Provider Portion of Application:  Faxed to clinic. Completed/faxed to [employer] per CMA.  12/02/23: No application in process per [employer] automated system... re-completed provider pages online. Submitted. Prescription(s): To be sent to [facility] (NPI [id number])   Application Status: Not submitted (pending signatures)  Next Steps: [x]    Patient portion complete (12/24) [x]    Provider forms complete: Completed by PharmD faxed to [facility] for print/signature by PCP)  [facility] clinic team - Please Addend/update this note as the "Next Steps" are completed in office*   Forwarded to [facility] CPhT Patient Advocate Team for future correspondences/re-enrollment.  Note routed to PCP Clinic Pool to ensure PCP signature is obtained and application is faxed.

## 2023-11-11 MED ORDER — TRULICITY 3 MG/0.5ML ~~LOC~~ SOAJ: 3.0000 mg | SUBCUTANEOUS | 4 refills | Status: DC

## 2023-11-11 NOTE — Progress Notes (Addendum)
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  MyChart message sent to patient informing her to enroll online (or to respond to MyChart message with household size and income and I will fill form out for her online).  12/24: Patient household size 1, SSI $2362. Patient portion submitted online. [employer] Case Number: [id number] Income Documentation: N/A - Electronic verification elected.  Provider Portion of Application:  Not completed yet. Upon patient response, will fill out provider pages Prescription(s): To be sent to [facility] (NPI [id number])   Application Status: Not submitted (pending signatures)  Next Steps: [x]    Patient portion complete (12/24) [x]    Provider forms complete: Completed by PharmD faxed to [facility] for print/signature by PCP) [x]    PCP signature [x]    eRx to [facility] (NPI [id number]) - Forwarded to PCP Clinic pool  [x]    Upon signature(s) Application to be faxed to [facility] Fax: [phone] AND scanned into patient chart [x]    Fax confirmation of approval received  *[facility] clinic team - Please Addend/update this note as the "Next Steps" are completed in office*   Forwarded to [facility] CPhT Patient Advocate Team for future correspondences/re-enrollment.  Note routed to PCP Clinic Pool to ensure PCP signature is obtained and application is faxed.

## 2023-11-12 ENCOUNTER — Ambulatory Visit: Payer: PPO | Admitting: Podiatry

## 2023-11-16 ENCOUNTER — Telehealth: Payer: Self-pay

## 2023-11-16 NOTE — Telephone Encounter (Signed)
Spoke to pt. I advised her I could not figure out who called her. But, I did advise her that I faxed our portion of her patient assistance paperwork for Trulicity on the 26th. She will check with [employer] on that.

## 2023-11-16 NOTE — Telephone Encounter (Signed)
Sending note to [place] pool.

## 2023-11-16 NOTE — Telephone Encounter (Signed)
 Marland Kitchen

## 2023-11-22 ENCOUNTER — Ambulatory Visit: Payer: PPO | Admitting: Physician Assistant

## 2023-11-25 ENCOUNTER — Encounter: Payer: PPO | Admitting: Internal Medicine

## 2023-11-26 ENCOUNTER — Ambulatory Visit (INDEPENDENT_AMBULATORY_CARE_PROVIDER_SITE_OTHER): Payer: PPO | Admitting: Clinical

## 2023-11-26 ENCOUNTER — Encounter (HOSPITAL_COMMUNITY): Payer: Self-pay | Admitting: Clinical

## 2023-11-26 DIAGNOSIS — F314 Bipolar disorder, current episode depressed, severe, without psychotic features: Secondary | ICD-10-CM

## 2023-11-26 DIAGNOSIS — F609 Personality disorder, unspecified: Secondary | ICD-10-CM

## 2023-11-26 DIAGNOSIS — Z8659 Personal history of other mental and behavioral disorders: Secondary | ICD-10-CM | POA: Diagnosis not present
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
## 2023-11-28 ENCOUNTER — Encounter (HOSPITAL_COMMUNITY): Payer: Self-pay

## 2023-11-29 ENCOUNTER — Ambulatory Visit (INDEPENDENT_AMBULATORY_CARE_PROVIDER_SITE_OTHER): Payer: PPO | Admitting: Physician Assistant

## 2023-11-29 DIAGNOSIS — N3281 Overactive bladder: Secondary | ICD-10-CM

## 2023-11-29 NOTE — Progress Notes (Addendum)
 PTNS  Session # 9 (lifetime #21)  Health & Social Factors: None Caffeine : 1 Alcohol : 0 Daytime voids #per day: 15 Night-time voids #per night: 1 Urgency: strong Incontinence Episodes #per day: 0 Ankle used: right Treatment Setting: 15 Feeling/ Response: sensory Comments: Patient tolerated well  Performed By: Lucie Hones, PA-C   Follow Up: 1 week

## 2023-11-29 NOTE — Patient Instructions (Signed)

## 2023-12-06 ENCOUNTER — Encounter: Payer: Self-pay | Admitting: Pharmacist

## 2023-12-06 ENCOUNTER — Ambulatory Visit: Payer: PPO | Admitting: Physician Assistant

## 2023-12-06 NOTE — Progress Notes (Signed)
[facility] - Re-enrollment 2025   Medication(s): All cholesterol medications (Prescribed Repatha)   Enrollment Expired: Expired in December 2024. Eligible for re-enrollment    Application Status:  Approved for re-enrollment    [facility]: ID [id number] Fund: Hypercholesterolemia - Medicare Access Assistance Type: Co-pay Start Date: 11/06/2023 End Date: 11/04/2024               Rx Card: Card No.  [id number] RX BIN:  [id number] PCN:  [id number] Group:  [id number]     Loree Fee, PharmD Clinical Pharmacist [facility] [phone]

## 2023-12-13 ENCOUNTER — Ambulatory Visit: Payer: PPO | Admitting: Podiatry

## 2023-12-16 ENCOUNTER — Ambulatory Visit: Payer: PPO | Admitting: Physician Assistant

## 2023-12-18 ENCOUNTER — Other Ambulatory Visit (HOSPITAL_COMMUNITY): Payer: Self-pay | Admitting: Psychiatry

## 2023-12-20 ENCOUNTER — Ambulatory Visit: Payer: PPO | Admitting: Physician Assistant

## 2023-12-20 ENCOUNTER — Encounter: Payer: PPO | Admitting: Internal Medicine

## 2023-12-20 ENCOUNTER — Other Ambulatory Visit (HOSPITAL_COMMUNITY): Payer: Self-pay

## 2023-12-20 MED ORDER — TEMAZEPAM 30 MG PO CAPS: 60.0000 mg | ORAL_CAPSULE | Freq: Every evening | ORAL | 0 refills | Status: DC | PRN

## 2023-12-20 MED ORDER — TRAZODONE HCL 100 MG PO TABS: 200.0000 mg | ORAL_TABLET | Freq: Every day | ORAL | 0 refills | Status: DC

## 2023-12-21 ENCOUNTER — Telehealth (INDEPENDENT_AMBULATORY_CARE_PROVIDER_SITE_OTHER): Payer: PPO | Admitting: Internal Medicine

## 2023-12-21 VITALS — BP 91/71 | HR 80 | Ht 69.0 in | Wt 232.0 lb

## 2023-12-21 DIAGNOSIS — I471 Supraventricular tachycardia, unspecified: Secondary | ICD-10-CM

## 2023-12-21 DIAGNOSIS — E1159 Type 2 diabetes mellitus with other circulatory complications: Secondary | ICD-10-CM | POA: Diagnosis not present

## 2023-12-21 DIAGNOSIS — G35 Multiple sclerosis: Secondary | ICD-10-CM | POA: Diagnosis not present

## 2023-12-21 DIAGNOSIS — F319 Bipolar disorder, unspecified: Secondary | ICD-10-CM | POA: Diagnosis not present

## 2023-12-21 DIAGNOSIS — Z7984 Long term (current) use of oral hypoglycemic drugs: Secondary | ICD-10-CM

## 2023-12-21 NOTE — Assessment & Plan Note (Signed)
No symptoms of recurrence On metoprolol 50 bid

## 2023-12-21 NOTE — Assessment & Plan Note (Signed)
Ongoing fatigue and some pain Avoiding medication due to cost and fear of side effects

## 2023-12-21 NOTE — Progress Notes (Signed)
 Subjective:    Patient ID: Emily Moon, female    DOB: 14-Oct-1961, 63 y.o.   MRN: [id number]  HPI Virtual visit due to a number of health concerns Identification done Reviewed limitations and billing and she gave consent Participants---patient in her home and I am in my office  Uses CGM Estimates A1c of 6% Time in range in 76-80% Average is 112 Only eats one meal a day--hasn't been hungry. Staying away from unhealthy food May have lost a little weight Takes the metformin  with food--still the 1000/500 Drinks protein drinks if needed  Still sees psychiatrist Did see a psychologist once----may need to switch due to availability  MS ---still fatigued. Also gets pain Hard to get the energy to leave the house Memory is poor--she forgets what she is talking about Sees neurologist still--currently no Rx  Current Outpatient Medications on File Prior to Visit  Medication Sig Dispense Refill   aspirin  EC 81 MG tablet Take 81 mg by mouth daily. Swallow whole.     Azelastine  HCl 137 MCG/SPRAY SOLN PLACE 2 SPRAYS INTO BOTH NOSTRILS 2 (TWO) TIMES DAILY 30 mL 11   B Complex-Biotin -FA (BIG 100, BIOTIN , PO) Take 300 mg by mouth at bedtime.     bisacodyl  (DULCOLAX) 5 MG EC tablet Take 5 mg by mouth daily as needed for moderate constipation.     cetirizine (ZYRTEC) 10 MG tablet Take 10 mg by mouth daily.     clindamycin  (CLEOCIN  T) 1 % external solution Apply to scalp once or twice a day prn bumps 30 mL 2   Continuous Glucose Sensor (FREESTYLE LIBRE 3 SENSOR) MISC PLACE 1 SENSOR ON THE SKIN EVERY 14 DAYS. USE TO CHECK GLUCOSE CONTINUOUSLY 6 each 12   cyanocobalamin  (VITAMIN B12) 1000 MCG/ML injection INJECT 1 ML (1,000 MCG TOTAL) INTO THE MUSCLE EVERY 30 (THIRTY) DAYS. FOR B12 VITAMIN 3 mL 3   desipramine  (NORPRAMIN ) 25 MG tablet 2  qam 180 tablet 0   diphenhydrAMINE  (BENADRYL ) 2 % cream Apply topically 3 (three) times daily as needed for itching.     Dulaglutide  (TRULICITY ) 3 MG/0.5ML  SOAJ Inject 3 mg into the skin once a week. 6 mL 4   DULoxetine  (CYMBALTA ) 20 MG capsule Take 2 capsules (40 mg total) by mouth daily. 180 capsule 0   estradiol  (ESTRACE ) 0.1 MG/GM vaginal cream Estrogen Cream Instruction Discard applicator Apply pea sized amount to tip of finger to urethra before bed. Wash hands well after application. Use Monday, Wednesday and Friday 42.5 g 11   Evolocumab  (REPATHA  SURECLICK) 140 MG/ML SOAJ Inject 140 mg into the skin every 14 (fourteen) days. PLEASE CALL OFFICE TO SCHEDULE APPOINTMENT PRIOR TO NEXT REFILL 2 mL 12   fenofibrate  (TRICOR ) 145 MG tablet Take 1 tablet (145 mg total) by mouth daily. For cholesterol. Please call [phone] for an appointment. 90 tablet 3   hydrOXYzine  (VISTARIL ) 50 MG capsule Take 2 capsules (100 mg total) by mouth at bedtime. TAKE 1 CAPSLE BY MOUTH AT NOON, 1 AT 6 PM, AND 1 AS NEEDED 180 capsule 0   ibuprofen  (ADVIL ) 200 MG tablet Take 800 mg by mouth 2 (two) times daily.     ketoconazole  (NIZORAL ) 2 % shampoo Apply 1 Application topically as directed. Wash from waist up 3 times a week for 2 months, then decrease to 1 time a month, let sit 5 minutes and rinse off 120 mL 6   lamoTRIgine  (LAMICTAL ) 150 MG tablet Take 2 tablets (300 mg total) by mouth  daily at bedtime. 180 tablet 0   levalbuterol  (XOPENEX  HFA) 45 MCG/ACT inhaler Inhale 2 puffs into the lungs every 4 (four) hours as needed for wheezing.     Lysine  500 MG TABS Take 1,000 mg by mouth daily.     metFORMIN  (GLUCOPHAGE ) 500 MG tablet Take 2 tabs by mouth every morning and 1 tab in the evening 270 tablet 3   methocarbamol  (ROBAXIN ) 500 MG tablet TAKE 1-2 TABLETS (500-1,000 MG TOTAL) BY MOUTH AT BEDTIME AS NEEDED FOR MUSCLE SPASMS. 180 tablet 0   metoprolol  tartrate (LOPRESSOR ) 50 MG tablet Take 1 tablet (50 mg total) by mouth 2 (two) times daily. 180 tablet 3   montelukast  (SINGULAIR ) 10 MG tablet TAKE 1 TABLET BY MOUTH EVERYDAY AT BEDTIME 90 tablet 1   mupirocin  ointment  (BACTROBAN ) 2 % Apply 1 Application topically daily. Qd to wound on finger 22 g 1   neomycin -bacitracin-polymyxin (NEOSPORIN) [id number] ointment Apply 1 application topically 4 (four) times daily as needed (for cut/scrapes.).     NEOMYCIN -POLYMYXIN-HYDROCORTISONE (CORTISPORIN) 1 % SOLN OTIC solution Apply 1-2 drops to toe BID after soaking 10 mL 1   pantoprazole  (PROTONIX ) 40 MG tablet TAKE 1 TABLET (40 MG TOTAL) BY MOUTH 2 (TWO) TIMES DAILY BEFORE A MEAL. FOR HEARTBURN 180 tablet 3   polyvinyl alcohol  (LIQUIFILM TEARS) 1.4 % ophthalmic solution Place 2 drops into both eyes at bedtime.     Roflumilast  (ZORYVE ) 0.3 % CREA Apply 1 application  topically daily. qd to aa psoriasis on legs, arms 60 g 4   Ruxolitinib Phosphate  (OPZELURA ) 1.5 % CREA Apply to aa's rash QD PRN. 60 g 3   STIOLTO RESPIMAT  2.5-2.5 MCG/ACT AERS Inhale 2 puffs into the lungs at bedtime.     temazepam  (RESTORIL ) 30 MG capsule Take 2 capsules (60 mg total) by mouth at bedtime as needed for sleep. 60 capsule 0   traZODone  (DESYREL ) 100 MG tablet Take 2 tablets (200 mg total) by mouth at bedtime. 60 tablet 0   valACYclovir  (VALTREX ) 1000 MG tablet Take 2 tablets by mouth twice daily for 1 day as needed for herpes outbreak. 12 tablet 0   Vitamin D3 (VITAMIN D ) 25 MCG tablet Take 3,000 Units by mouth daily.     No current facility-administered medications on file prior to visit.    Allergies  Allergen Reactions   Albuterol  Shortness Of Breath and Other (See Comments)    Makes pt feel jittery/ tacycardic   Crestor  [Rosuvastatin ] Other (See Comments)    Joint pain, muscle pain, and hair loss   Halcion [Triazolam] Other (See Comments)    Dizziness,headaches,bladder problems   Levaquin  [Levofloxacin  In D5w] Diarrhea and Itching    Shoulder pain   Metformin  And Related Rash   Naproxen Sodium     Patient tolerates in small doses. Her reaction is swelling of the lower extremities.    Ozempic  (0.25 Or 0.5 Mg-Dose)  [Semaglutide (0.25 Or 0.5mg -Dos)] Itching   Cefaclor Other (See Comments)    Doesn't remember---unsure if actually allergic    Prednisone      Elevates blood sugars   Sulfa  Antibiotics Itching    Unsure of reaction possibly itching   Tramadol  Itching and Nausea And Vomiting   Zinc Other (See Comments)    constipation  constipation    Aripiprazole Other (See Comments)    Muscle tension/cramping   Diclofenac  Sodium Rash    made very sick    Past Medical History:  Diagnosis Date   Acute pyelonephritis 12/25/2021  Arthritis    osteo   Asthma    Bipolar disorder (HCC) 05/21/2014   Cataracts, bilateral    Cervical radiculitis    Chest pain    Chronic pain syndrome    COPD (chronic obstructive pulmonary disease) (HCC)    DDD (degenerative disc disease), cervical    also back   Depression    Diabetes mellitus without complication (HCC)    Dizziness    Positional   Edema    feet/legs   Epigastric pain    Fibromyalgia syndrome    Fungal infection    Finger nails   GERD (gastroesophageal reflux disease)    Gout    Heart palpitations    Hip dysplasia, 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   History of kidney stones    Hx of sepsis    Hypercholesterolemia    IDA (iron  deficiency anemia)    Multiple sclerosis (HCC)    weakness   Osteoporosis    osteoarthritis   Pneumonia    PONV (postoperative nausea and vomiting)    no problem after cataract surgery   Prediabetes 07/22/2018   Psoriasis    Sciatica    Seasonal allergies    Shortness of breath dyspnea    wheezing   Sleep apnea 2012   sleep study / slight, no interventions   Urinary frequency    Vitamin B 12 deficiency    Weight gain 06/21/2014    Past Surgical History:  Procedure Laterality Date   CATARACT EXTRACTION W/PHACO Left 05/21/2015   Procedure: CATARACT EXTRACTION PHACO AND INTRAOCULAR LENS PLACEMENT (IOC);  Surgeon: Elsie Carmine, MD;  Location: [facility];  Service: Ophthalmology;  Laterality: Left;  US  00:35 AP%  22.9 CDE 8.11 fluid pack lot #[id number]   CATARACT EXTRACTION W/PHACO Right 06/04/2015   Procedure: CATARACT EXTRACTION PHACO AND INTRAOCULAR LENS PLACEMENT (IOC);  Surgeon: Elsie Carmine, MD;  Location: [facility];  Service: Ophthalmology;  Laterality: Right;  US :00:48 AP%: 10.5 CDE:5.08 Fluid lot #[id number]   CYSTOSCOPY/URETEROSCOPY/HOLMIUM LASER/STENT PLACEMENT Bilateral 09/22/2016   Procedure: CYSTOSCOPY/URETEROSCOPY/HOLMIUM LASER/STENT PLACEMENT;  Surgeon: Rosina Riis, MD;  Location: [facility];  Service: Urology;  Laterality: Bilateral;   EYE SURGERY  2015   tissue biopsy   FOOT SURGERY  2015   JOINT REPLACEMENT Left 2013   hip replacement   LITHOTRIPSY     PTOSIS REPAIR Bilateral 02/18/2016   Procedure: BILATERAL PTOSIS REPAIR UPPER EYELIDS;  Surgeon: Greig CHRISTELLA Gay, MD;  Location: [facility];  Service: Ophthalmology;  Laterality: Bilateral;  LEAVE PT EARLY AM   thumb surgery Right    TONSILLECTOMY  1973   TOTAL HIP ARTHROPLASTY Right 07/02/2021   Procedure: TOTAL HIP ARTHROPLASTY ANTERIOR APPROACH;  Surgeon: Melodi Lerner, MD;  Location: [facility];  Service: Orthopedics;  Laterality: Right;    Family History  Problem Relation Age of Onset   Cancer Mother    Heart disease Mother    Diabetes Mother    Sleep apnea Mother    Cancer Father        Abdomen with mastasis   High Cholesterol Father    Kidney disease Neg Hx    Bladder Cancer Neg Hx    Prostate cancer Neg Hx    Kidney cancer Neg Hx     Social History   Socioeconomic History   Marital status: Divorced    Spouse name: Not on file   Number of children: 1   Years of education: Not on file   Highest education level: Bachelor's degree (e.g., BA, AB, BS)  Occupational History   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   Occupation: Disability  Tobacco Use   Smoking status: Former    Current packs/day: 0.00    Average packs/day: 1 pack/day for 25.0 years (25.0 ttl pk-yrs)    Types:  Cigarettes    Start date: 98    Quit date: 2019    Years since quitting: 6.0    Passive exposure: Past   Smokeless tobacco: Never   Tobacco comments:    occasional use  Vaping Use   Vaping status: Former  Substance and Sexual Activity   Alcohol  use: No    Alcohol /week: 0.0 standard drinks of alcohol    Drug use: Yes    Types: Methylphenidate , Marijuana    Comment: Delta 8 - once per day   Sexual activity: Never  Other Topics Concern   Not on file  Social History Narrative   Not on file   Social Drivers of Health   Financial Resource Strain: High Risk (12/21/2023)   Overall Financial Resource Strain (CARDIA)    Difficulty of Paying Living Expenses: Hard  Food Insecurity: Food Insecurity Present (12/21/2023)   Hunger Vital Sign    Worried About Running Out of Food in the Last Year: Patient declined    Ran Out of Food in the Last Year: Often true  Transportation Needs: No Transportation Needs (12/21/2023)   PRAPARE - Administrator, Civil Service (Medical): No    Lack of Transportation (Non-Medical): No  Physical Activity: Inactive (12/21/2023)   Exercise Vital Sign    Days of Exercise per Week: 0 days    Minutes of Exercise per Session: 0 min  Stress: Stress Concern Present (12/21/2023)   [employer] of Occupational Health - Occupational Stress Questionnaire    Feeling of Stress : To some extent  Social Connections: Moderately Isolated (12/21/2023)   Social Connection and Isolation Panel [NHANES]    Frequency of Communication with Friends and Family: Three times a week    Frequency of Social Gatherings with Friends and Family: Never    Attends Religious Services: 1 to 4 times per year    Active Member of [employer] or Organizations: No    Attends Banker Meetings: Never    Marital Status: Divorced  Catering Manager Violence: Not At Risk (10/28/2023)   Humiliation, Afraid, Rape, and Kick questionnaire    Fear of Current or Ex-Partner: No    Emotionally  Abused: No    Physically Abused: No    Sexually Abused: No   Review of Systems Right > left nasal congestion Had surgery on 5th toe by Dr Hyatt----seems to have helped tendon issue (using needles for hammertoe) Doesn't sleep well--not new. Flipped --sleeps better during day and up at night    Objective:   Physical Exam Constitutional:      Appearance: Normal appearance.  Pulmonary:     Effort: Pulmonary effort is normal. No respiratory distress.  Neurological:     Mental Status: She is alert.  Psychiatric:     Comments: No depression and not manic Multiple complaints but seems to have normal insight    a         Assessment & Plan:

## 2023-12-21 NOTE — Assessment & Plan Note (Signed)
Control seems to be very good based on her CGM readings Will check in the office next time Trulicity 3mg  weekly and metformin 1000/500

## 2023-12-21 NOTE — Assessment & Plan Note (Signed)
Sees Dr Donell Beers No mania and doesn't seem depressed On lamictal 300mg  bedtime, trazodone 200mg  bedtime, duloxetine 40 bid, temazepam/vistaril Trying to get with a psychologist

## 2023-12-22 ENCOUNTER — Ambulatory Visit (HOSPITAL_COMMUNITY): Payer: PPO | Admitting: Psychiatry

## 2023-12-22 DIAGNOSIS — F329 Major depressive disorder, single episode, unspecified: Secondary | ICD-10-CM

## 2023-12-22 DIAGNOSIS — F310 Bipolar disorder, current episode hypomanic: Secondary | ICD-10-CM

## 2023-12-22 MED ORDER — TEMAZEPAM 30 MG PO CAPS: 60.0000 mg | ORAL_CAPSULE | Freq: Every evening | ORAL | 4 refills | Status: DC | PRN

## 2023-12-22 MED ORDER — DESIPRAMINE HCL 25 MG PO TABS: ORAL_TABLET | ORAL | 0 refills | Status: DC

## 2023-12-22 MED ORDER — HYDROXYZINE PAMOATE 50 MG PO CAPS: 100.0000 mg | ORAL_CAPSULE | Freq: Every day | ORAL | 2 refills | Status: DC

## 2023-12-22 MED ORDER — DESIPRAMINE HCL 25 MG PO TABS: ORAL_TABLET | ORAL | 2 refills | Status: DC

## 2023-12-22 MED ORDER — TRAZODONE HCL 100 MG PO TABS: ORAL_TABLET | ORAL | 4 refills | Status: DC

## 2023-12-22 MED ORDER — LAMOTRIGINE 150 MG PO TABS: ORAL_TABLET | ORAL | 0 refills | Status: DC

## 2023-12-22 MED ORDER — DULOXETINE HCL 20 MG PO CPEP: 40.0000 mg | ORAL_CAPSULE | Freq: Every day | ORAL | 0 refills | Status: DC

## 2023-12-22 NOTE — Progress Notes (Signed)
 The most painful.  This  Psychiatric Initial Adult Assessment   Patient Identification: Emily Moon MRN:  [id number] Date of Evaluation:  12/22/2023 Referral Source: From the community Chief Complaint: Feels exhausted   Today the patient is doing fairly well.  She is at her baseline.  Today we will increase her trazodone  to taking 300 mg instead of 200 mg.  She will continue taking all the other medications prescribed.  She tried to get into therapy but apparently did not connect.  The patient denies persistent depressions.  She claims things are pretty much as they have been.    Procedure(s) Performed: * No surgery found *  Anesthes  Patient location:   Post pain:   Post assessment:   Last Vitals:  There were no vitals filed for this visit.   Post vital signs:   Level of consciousness:   Complications: . Associated Signs/Symptoms: Depression Symptoms:  hopelessness, (Hypo) Manic Symptoms:   Anxiety Symptoms:   Psychotic Symptoms:   PTSD Symptoms:   Past Psychiatric History: The patient was seen in a psychiatric office and takes multiple medications she's not been recently hospitalized.  Previous Psychotropic Medications: Yes   Substance Abuse History in the last 12 months:  No.  Consequences of Substance Abuse: Negative  Past Medical History:  Past Medical History:  Diagnosis Date   Acute pyelonephritis 12/25/2021   Arthritis    osteo   Asthma    Bipolar disorder (HCC) 05/21/2014   Cataracts, bilateral    Cervical radiculitis    Chest pain    Chronic pain syndrome    COPD (chronic obstructive pulmonary disease) (HCC)    DDD (degenerative disc disease), cervical    also back   Depression    Diabetes mellitus without complication (HCC)    Dizziness    Positional   Edema    feet/legs   Epigastric pain    Fibromyalgia syndrome    Fungal infection    Finger nails   GERD (gastroesophageal reflux disease)    Gout    Heart palpitations    Hip  dysplasia, 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   History of kidney stones    Hx of sepsis    Hypercholesterolemia    IDA (iron  deficiency anemia)    Multiple sclerosis (HCC)    weakness   Osteoporosis    osteoarthritis   Pneumonia    PONV (postoperative nausea and vomiting)    no problem after cataract surgery   Prediabetes 07/22/2018   Psoriasis    Sciatica    Seasonal allergies    Shortness of breath dyspnea    wheezing   Sleep apnea 2012   sleep study / slight, no interventions   Urinary frequency    Vitamin B 12 deficiency    Weight gain 06/21/2014    Past Surgical History:  Procedure Laterality Date   CATARACT EXTRACTION W/PHACO Left 05/21/2015   Procedure: CATARACT EXTRACTION PHACO AND INTRAOCULAR LENS PLACEMENT (IOC);  Surgeon: Elsie Carmine, MD;  Location: [facility];  Service: Ophthalmology;  Laterality: Left;  US  00:35 AP% 22.9 CDE 8.11 fluid pack lot #[id number]   CATARACT EXTRACTION W/PHACO Right 06/04/2015   Procedure: CATARACT EXTRACTION PHACO AND INTRAOCULAR LENS PLACEMENT (IOC);  Surgeon: Elsie Carmine, MD;  Location: [facility];  Service: Ophthalmology;  Laterality: Right;  US :00:48 AP%: 10.5 CDE:5.08 Fluid lot #[id number]   CYSTOSCOPY/URETEROSCOPY/HOLMIUM LASER/STENT PLACEMENT Bilateral 09/22/2016   Procedure: CYSTOSCOPY/URETEROSCOPY/HOLMIUM LASER/STENT PLACEMENT;  Surgeon: Rosina Riis, MD;  Location: [facility];  Service: Urology;  Laterality:  Bilateral;   EYE SURGERY  2015   tissue biopsy   FOOT SURGERY  2015   JOINT REPLACEMENT Left 2013   hip replacement   LITHOTRIPSY     PTOSIS REPAIR Bilateral 02/18/2016   Procedure: BILATERAL PTOSIS REPAIR UPPER EYELIDS;  Surgeon: Greig CHRISTELLA Gay, MD;  Location: [facility];  Service: Ophthalmology;  Laterality: Bilateral;  LEAVE PT EARLY AM   thumb surgery Right    TONSILLECTOMY  1973   TOTAL HIP ARTHROPLASTY Right 07/02/2021   Procedure: TOTAL HIP ARTHROPLASTY ANTERIOR APPROACH;  Surgeon: Melodi Lerner, MD;  Location: [facility];  Service: Orthopedics;  Laterality: Right;    Family Psychiatric History:   Family History:  Family History  Problem Relation Age of Onset   Cancer Mother    Heart disease Mother    Diabetes Mother    Sleep apnea Mother    Cancer Father        Abdomen with mastasis   High Cholesterol Father    Kidney disease Neg Hx    Bladder Cancer Neg Hx    Prostate cancer Neg Hx    Kidney cancer Neg Hx     Social History:   Social History   Socioeconomic History   Marital status: Divorced    Spouse name: Not on file   Number of children: 1   Years of education: Not on file   Highest education level: Bachelor's degree (e.g., BA, AB, BS)  Occupational History   Occupation: Clinical Biochemist Rep at [employer]    Employer: OTHER   Occupation: Disability  Tobacco Use   Smoking status: Former    Current packs/day: 0.00    Average packs/day: 1 pack/day for 25.0 years (25.0 ttl pk-yrs)    Types: Cigarettes    Start date: 76    Quit date: 2019    Years since quitting: 6.1    Passive exposure: Past   Smokeless tobacco: Never   Tobacco comments:    occasional use  Vaping Use   Vaping status: Former  Substance and Sexual Activity   Alcohol  use: No    Alcohol /week: 0.0 standard drinks of alcohol    Drug use: Yes    Types: Methylphenidate , Marijuana    Comment: Delta 8 - once per day   Sexual activity: Never  Other Topics Concern   Not on file  Social History Narrative   Not on file   Social Drivers of Health   Financial Resource Strain: High Risk (12/21/2023)   Overall Financial Resource Strain (CARDIA)    Difficulty of Paying Living Expenses: Hard  Food Insecurity: Food Insecurity Present (12/21/2023)   Hunger Vital Sign    Worried About Running Out of Food in the Last Year: Patient declined    Ran Out of Food in the Last Year: Often true  Transportation Needs: No Transportation Needs (12/21/2023)   PRAPARE - Administrator, Civil Service (Medical): No    Lack  of Transportation (Non-Medical): No  Physical Activity: Inactive (12/21/2023)   Exercise Vital Sign    Days of Exercise per Week: 0 days    Minutes of Exercise per Session: 0 min  Stress: Stress Concern Present (12/21/2023)   [employer] of Occupational Health - Occupational Stress Questionnaire    Feeling of Stress : To some extent  Social Connections: Moderately Isolated (12/21/2023)   Social Connection and Isolation Panel [NHANES]    Frequency of Communication with Friends and Family: Three times a week  Frequency of Social Gatherings with Friends and Family: Never    Attends Religious Services: 1 to 4 times per year    Active Member of [employer] or Organizations: No    Attends Banker Meetings: Never    Marital Status: Divorced    Additional Social History:   Allergies:   Allergies  Allergen Reactions   Albuterol  Shortness Of Breath and Other (See Comments)    Makes pt feel jittery/ tacycardic   Crestor  [Rosuvastatin ] Other (See Comments)    Joint pain, muscle pain, and hair loss   Halcion [Triazolam] Other (See Comments)    Dizziness,headaches,bladder problems   Levaquin  [Levofloxacin  In D5w] Diarrhea and Itching    Shoulder pain   Metformin  And Related Rash   Naproxen Sodium     Patient tolerates in small doses. Her reaction is swelling of the lower extremities.    Ozempic  (0.25 Or 0.5 Mg-Dose) [Semaglutide (0.25 Or 0.5mg -Dos)] Itching   Cefaclor Other (See Comments)    Doesn't remember---unsure if actually allergic    Prednisone      Elevates blood sugars   Sulfa  Antibiotics Itching    Unsure of reaction possibly itching   Tramadol  Itching and Nausea And Vomiting   Zinc Other (See Comments)    constipation  constipation    Aripiprazole Other (See Comments)    Muscle tension/cramping   Diclofenac  Sodium Rash    made very sick    Metabolic Disorder Labs: Lab Results  Component Value Date   HGBA1C 6.6 (H) 03/23/2023   MPG 142.72 07/02/2021   No  results found for: PROLACTIN Lab Results  Component Value Date   CHOL 122 03/23/2023   TRIG 155 (H) 03/23/2023   HDL 39 (L) 03/23/2023   CHOLHDL 4 08/20/2022   VLDL 39.2 08/20/2022   LDLCALC 56 03/23/2023   LDLCALC 67 08/20/2022     Current Medications: Current Outpatient Medications  Medication Sig Dispense Refill   aspirin  EC 81 MG tablet Take 81 mg by mouth daily. Swallow whole.     Azelastine  HCl 137 MCG/SPRAY SOLN PLACE 2 SPRAYS INTO BOTH NOSTRILS 2 (TWO) TIMES DAILY 30 mL 11   B Complex-Biotin -FA (BIG 100, BIOTIN , PO) Take 300 mg by mouth at bedtime.     bisacodyl  (DULCOLAX) 5 MG EC tablet Take 5 mg by mouth daily as needed for moderate constipation.     cetirizine (ZYRTEC) 10 MG tablet Take 10 mg by mouth daily.     clindamycin  (CLEOCIN  T) 1 % external solution Apply to scalp once or twice a day prn bumps 30 mL 2   Continuous Glucose Sensor (FREESTYLE LIBRE 3 SENSOR) MISC PLACE 1 SENSOR ON THE SKIN EVERY 14 DAYS. USE TO CHECK GLUCOSE CONTINUOUSLY 6 each 12   cyanocobalamin  (VITAMIN B12) 1000 MCG/ML injection INJECT 1 ML (1,000 MCG TOTAL) INTO THE MUSCLE EVERY 30 (THIRTY) DAYS. FOR B12 VITAMIN 3 mL 3   desipramine  (NORPRAMIN ) 25 MG tablet 2  qam 180 tablet 2   diphenhydrAMINE  (BENADRYL ) 2 % cream Apply topically 3 (three) times daily as needed for itching.     Dulaglutide  (TRULICITY ) 3 MG/0.5ML SOAJ Inject 3 mg into the skin once a week. 6 mL 4   DULoxetine  (CYMBALTA ) 20 MG capsule Take 2 capsules (40 mg total) by mouth daily. 180 capsule 0   estradiol  (ESTRACE ) 0.1 MG/GM vaginal cream Estrogen Cream Instruction Discard applicator Apply pea sized amount to tip of finger to urethra before bed. Wash hands well after application. Use Monday, Wednesday and  Friday 42.5 g 11   Evolocumab  (REPATHA  SURECLICK) 140 MG/ML SOAJ Inject 140 mg into the skin every 14 (fourteen) days. PLEASE CALL OFFICE TO SCHEDULE APPOINTMENT PRIOR TO NEXT REFILL 2 mL 12   fenofibrate  (TRICOR ) 145 MG tablet  Take 1 tablet (145 mg total) by mouth daily. For cholesterol. Please call [phone] for an appointment. 90 tablet 3   hydrOXYzine  (VISTARIL ) 50 MG capsule Take 2 capsules (100 mg total) by mouth at bedtime. TAKE 1 CAPSLE BY MOUTH AT NOON, 1 AT 6 PM, AND 1 AS NEEDED 180 capsule 2   ibuprofen  (ADVIL ) 200 MG tablet Take 800 mg by mouth 2 (two) times daily.     ketoconazole  (NIZORAL ) 2 % shampoo Apply 1 Application topically as directed. Wash from waist up 3 times a week for 2 months, then decrease to 1 time a month, let sit 5 minutes and rinse off 120 mL 6   lamoTRIgine  (LAMICTAL ) 150 MG tablet Take 2 tablets (300 mg total) by mouth daily at bedtime. 180 tablet 0   levalbuterol  (XOPENEX  HFA) 45 MCG/ACT inhaler Inhale 2 puffs into the lungs every 4 (four) hours as needed for wheezing.     Lysine  500 MG TABS Take 1,000 mg by mouth daily.     metFORMIN  (GLUCOPHAGE ) 500 MG tablet Take 2 tabs by mouth every morning and 1 tab in the evening 270 tablet 3   methocarbamol  (ROBAXIN ) 500 MG tablet TAKE 1-2 TABLETS (500-1,000 MG TOTAL) BY MOUTH AT BEDTIME AS NEEDED FOR MUSCLE SPASMS. 180 tablet 0   metoprolol  tartrate (LOPRESSOR ) 50 MG tablet Take 1 tablet (50 mg total) by mouth 2 (two) times daily. 180 tablet 3   montelukast  (SINGULAIR ) 10 MG tablet TAKE 1 TABLET BY MOUTH EVERYDAY AT BEDTIME 90 tablet 1   mupirocin  ointment (BACTROBAN ) 2 % Apply 1 Application topically daily. Qd to wound on finger 22 g 1   neomycin -bacitracin-polymyxin (NEOSPORIN) [phone] ointment Apply 1 application topically 4 (four) times daily as needed (for cut/scrapes.).     NEOMYCIN -POLYMYXIN-HYDROCORTISONE (CORTISPORIN) 1 % SOLN OTIC solution Apply 1-2 drops to toe BID after soaking 10 mL 1   pantoprazole  (PROTONIX ) 40 MG tablet TAKE 1 TABLET (40 MG TOTAL) BY MOUTH 2 (TWO) TIMES DAILY BEFORE A MEAL. FOR HEARTBURN 180 tablet 3   polyvinyl alcohol  (LIQUIFILM TEARS) 1.4 % ophthalmic solution Place 2 drops into both eyes at bedtime.      Roflumilast  (ZORYVE ) 0.3 % CREA Apply 1 application  topically daily. qd to aa psoriasis on legs, arms 60 g 4   Ruxolitinib Phosphate  (OPZELURA ) 1.5 % CREA Apply to aa's rash QD PRN. 60 g 3   STIOLTO RESPIMAT  2.5-2.5 MCG/ACT AERS Inhale 2 puffs into the lungs at bedtime.     temazepam  (RESTORIL ) 30 MG capsule Take 2 capsules (60 mg total) by mouth at bedtime as needed for sleep. 60 capsule 4   traZODone  (DESYREL ) 100 MG tablet 3 qhs 90 tablet 4   valACYclovir  (VALTREX ) 1000 MG tablet Take 2 tablets by mouth twice daily for 1 day as needed for herpes outbreak. 12 tablet 0   Vitamin D3 (VITAMIN D ) 25 MCG tablet Take 3,000 Units by mouth daily.     No current facility-administered medications for this visit.    Neurologic: Headache: No Seizure: No Paresthesias:No  Musculoskeletal: Strength & Muscle Tone: within normal limits Gait & Station: normal Patient leans: N/A  Psychiatric Specialty Exam: ROS  Last menstrual period 08/23/2014.There is no height or weight on file to  calculate BMI.  General Appearance: Casual  Eye Contact:  Good  Speech:  Clear and Coherent  Volume:  Normal  Mood:  Negative  Affect:  Appropriate  Thought Process:  Goal Directed  Orientation:  NA  Thought Content:  Logical  Suicidal Thoughts:  No  Homicidal Thoughts:  No  Memory:  Negative  Judgement:  Good  Insight:  Good  Psychomotor Activity:  Normal  Concentration:    Recall:    Fund of Knowledge:Good  Language: Good  Akathisia:  No  Handed:  Right  AIMS (if indicated):    Assets:  Desire for Improvement  ADL's:  Intact  Cognition: WNL  Sleep:      2/5/20252:48 PM   This patient's diagnosis is major depression.  She takes Cymbalta  and desipramine  and does fairly well.  She also has a personality disorder and takes Lamictal  for mood stability.  She has insomnia and takes high dose Restoril  60 mg and today we increased her trazodone  to a dose of 300 mg.  Physically the patient seems to be  stable.  She will return to see me in approximately 4 months.

## 2023-12-24 ENCOUNTER — Ambulatory Visit (INDEPENDENT_AMBULATORY_CARE_PROVIDER_SITE_OTHER): Payer: PPO | Admitting: Physician Assistant

## 2023-12-24 DIAGNOSIS — N3281 Overactive bladder: Secondary | ICD-10-CM

## 2023-12-24 NOTE — Progress Notes (Signed)
 PTNS  Session # 10 (Lifetime #22)  Health & Social Factors: More chronic pain, doesn't like leaving the house. Would like to stop weekly treatments and resume monthly. Caffeine : 0 Alcohol : 0 Daytime voids #per day: 8 Night-time voids #per night: 1 Urgency: strong Incontinence Episodes #per day: 0 Ankle used: right Treatment Setting: 1 Feeling/ Response: Sensory  Comments: Patient tolerated well. Will resume monthly treatments per patient preference.  Performed By: Guthrie Lemme, PA-C   Follow Up: 1 month

## 2023-12-27 ENCOUNTER — Ambulatory Visit: Payer: PPO | Admitting: Physician Assistant

## 2023-12-31 ENCOUNTER — Telehealth: Payer: Self-pay | Admitting: Pharmacy Technician

## 2023-12-31 ENCOUNTER — Other Ambulatory Visit (HOSPITAL_COMMUNITY): Payer: Self-pay

## 2023-12-31 ENCOUNTER — Ambulatory Visit: Payer: Self-pay | Admitting: Physician Assistant

## 2023-12-31 NOTE — Telephone Encounter (Signed)
Pharmacy Patient Advocate Encounter   Received notification from CoverMyMeds that prior authorization for Repatha is required/requested.   Insurance verification completed.   The patient is insured through [facility] ADVANTAGE/RX ADVANCE .   Per test claim: PA required; PA submitted to above mentioned insurance via CoverMyMeds Key/confirmation #/EOC [id number] Status is pending

## 2024-01-03 ENCOUNTER — Other Ambulatory Visit (HOSPITAL_COMMUNITY): Payer: Self-pay

## 2024-01-03 NOTE — Telephone Encounter (Signed)
Pharmacy Patient Advocate Encounter  Received notification from [facility] ADVANTAGE/RX ADVANCE that Prior Authorization for Repatha has been APPROVED from 12/31/23 to 12/30/24. Unable to obtain price due to refill too soon rejection, last fill date 12/31/23 next available fill date04/18/25   PA #/Case ID/Reference #: [id number]

## 2024-01-05 ENCOUNTER — Encounter: Payer: Self-pay | Admitting: Pharmacist

## 2024-01-05 NOTE — Addendum Note (Signed)
Addended by: Loree Fee on: 01/05/2024 04:53 PM   Modules accepted: Orders

## 2024-01-06 ENCOUNTER — Encounter (HOSPITAL_COMMUNITY): Payer: Self-pay

## 2024-01-06 MED ORDER — TRULICITY 3 MG/0.5ML ~~LOC~~ SOAJ: SUBCUTANEOUS | 3 refills | Status: DC

## 2024-01-06 NOTE — Addendum Note (Signed)
Addended by: Tillman Abide I on: 01/06/2024 10:02 AM   Modules accepted: Orders

## 2024-01-07 ENCOUNTER — Encounter (HOSPITAL_COMMUNITY): Payer: Self-pay

## 2024-01-12 ENCOUNTER — Ambulatory Visit (INDEPENDENT_AMBULATORY_CARE_PROVIDER_SITE_OTHER): Payer: PPO | Admitting: Podiatry

## 2024-01-12 ENCOUNTER — Encounter: Payer: Self-pay | Admitting: Podiatry

## 2024-01-12 DIAGNOSIS — M205X9 Other deformities of toe(s) (acquired), unspecified foot: Secondary | ICD-10-CM

## 2024-01-12 DIAGNOSIS — M205X2 Other deformities of toe(s) (acquired), left foot: Secondary | ICD-10-CM | POA: Diagnosis not present

## 2024-01-12 NOTE — Progress Notes (Signed)
 She presents today for follow-up of her fourth toe right foot.  She states that is doing just great gets a little stiff sometimes but for the most part is doing really well.  Denies any changes with her diabetes.  States that the fourth toe on the left foot is a little bit tender and is starting to roll under.  Objective: Vital signs are stable oriented x 3.  Pulses are palpable.  Neurologic sensorium is intact Deetjen reflexes are intact muscle strength is normal and symmetrical.  She does have mild adductovarus rotation hammertoe deformity fourth left most of it is flexor contracture I do believe that it could straighten out nicely.  No open lesions or wounds.  Fourth toe right foot is laying perfectly straight and in good position with no tenderness.  Assessment: Well-healing surgical foot.  Diabetes mellitus without significant complications.  Hammertoe deformity fourth left.  Plan: Discussed etiology pathology and surgical therapies we are going to have her back for a flexor tenotomy fourth digit left foot.

## 2024-01-19 ENCOUNTER — Ambulatory Visit: Payer: PPO | Admitting: Dermatology

## 2024-01-19 ENCOUNTER — Encounter: Payer: Self-pay | Admitting: Dermatology

## 2024-01-19 DIAGNOSIS — Z7189 Other specified counseling: Secondary | ICD-10-CM | POA: Diagnosis not present

## 2024-01-19 DIAGNOSIS — L2089 Other atopic dermatitis: Secondary | ICD-10-CM

## 2024-01-19 DIAGNOSIS — Z79899 Other long term (current) drug therapy: Secondary | ICD-10-CM

## 2024-01-19 DIAGNOSIS — L509 Urticaria, unspecified: Secondary | ICD-10-CM | POA: Diagnosis not present

## 2024-01-19 DIAGNOSIS — R21 Rash and other nonspecific skin eruption: Secondary | ICD-10-CM

## 2024-01-19 DIAGNOSIS — L409 Psoriasis, unspecified: Secondary | ICD-10-CM | POA: Diagnosis not present

## 2024-01-19 NOTE — Progress Notes (Signed)
   Follow-Up Visit   Subjective  Emily Moon is a 63 y.o. female who presents for the following: Psoriasis - pt currently using Opzelura cream to aa's QHS on the face and Zoryve cream on aa's body and arms PRN. Pt states that Zoryve cream does not work for lesions on the face. Patient was referred to Dr. Allena Katz  Complains of new spots on face that are persistent .  The following portions of the chart were reviewed this encounter and updated as appropriate: medications, allergies, medical history  Review of Systems:  No other skin or systemic complaints except as noted in HPI or Assessment and Plan.  Objective  Well appearing patient in no apparent distress; mood and affect are within normal limits.  Areas Examined: The face  Relevant exam findings are noted in the Assessment and Plan.   Assessment & Plan  URTICARIA   OTHER ATOPIC DERMATITIS   PSORIASIS   COUNSELING AND COORDINATION OF CARE   MEDICATION MANAGEMENT   PSORIASIS by proven 06/29/24 -     Well-demarcated erythematous papules/plaques with silvery scale, guttate pink scaly papules on the legs and R cheek 3% BSA.  Chronic and persistent condition with duration or expected duration over one year. Condition is symptomatic/ bothersome to patient. Not currently at goal. Treatment Plan: Continue Zoryve cream to aa's QD.   Counseling on psoriasis and coordination of care  psoriasis is a chronic non-curable, but treatable genetic/hereditary disease that may have other systemic features affecting other organ systems such as joints (Psoriatic Arthritis). It is associated with an increased risk of inflammatory bowel disease, heart disease, non-alcoholic fatty liver disease, and depression.  Treatments include light and laser treatments; topical medications; and systemic medications including oral and injectables.  Rash - could be urticaria related to CPAP machine with element of atopic  dermatitis     Exam: Flushing with urticaria, pinkness on the cheeks. Scaly patch of the R lat chin.  Treatment Plan: Increase Zyrtec 10 mg may increase to 4 tabs daily as needed continue Singulair QD.  Continue oral Singulair Opzelura cream bid prn  For Hives (urticaria); dermatographism or Itch: Start non sedating antihistamine (either Allegra 180mg , or Claritin 10mg , or Zyrtec 10mg ) daily.  All these are non-prescription ("Over the Counter").   Start out with 1 pill a day.   After a week if not improving may increase to 2 pills a day.   After another week if not improving may increase to 3 pills a day.   After another week if still not improving may take up to 4 pills a day. Stay at highest dose that keeps condition controlled, but only up to 4 pills a day. Stay at the controlling dose for at least 2 weeks. Contact office if taking 4 pills of antihistamine a day for at least 2 weeks without control of condition as other options may be available.  Complicated patient - medical history of asthma, diabetes, bipolar, psoriasis, COPD, GERD, multiple sclerosis, fibromyalgia, chronic pain syndrome, and osteoprosis.  Pt was seen by Dr. Allena Katz who dx her with [facility] and chronic pain syndrome   Return in about 6 months (around 07/21/2024) for psoriasis follow up.  Maylene Roes, CMA, am acting as scribe for Armida Sans, MD .  Documentation: I have reviewed the above documentation for accuracy and completeness, and I agree with the above.  Armida Sans, MD

## 2024-01-19 NOTE — Patient Instructions (Addendum)
 For Hives (urticaria); dermatographism or Itch: Start non sedating antihistamine (either Allegra 180mg , or Claritin 10mg , or Zyrtec 10mg ) daily.  All these are non-prescription ("Over the Counter").   Start out with 1 pill a day.   After a week if not improving may increase to 2 pills a day.   After another week if not improving may increase to 3 pills a day.   After another week if still not improving may take up to 4 pills a day. Stay at highest dose that keeps condition controlled, but only up to 4 pills a day. Stay at the controlling dose for at least 2 weeks. Contact office if taking 4 pills of antihistamine a day for at least 2 weeks without control of condition as other options may be available.  Continue Zoryve cream for psoriasis once daily. Continue Opzelura cream for eczema/psoriasis patches once daily.  Due to recent changes in healthcare laws, you may see results of your pathology and/or laboratory studies on MyChart before the doctors have had a chance to review them. We understand that in some cases there may be results that are confusing or concerning to you. Please understand that not all results are received at the same time and often the doctors may need to interpret multiple results in order to provide you with the best plan of care or course of treatment. Therefore, we ask that you please give [place] 2 business days to thoroughly review all your results before contacting the office for clarification. Should we see a critical lab result, you will be contacted sooner.   If You Need Anything After Your Visit  If you have any questions or concerns for your doctor, please call our main line at [phone] and press option 4 to reach your doctor's medical assistant. If no one answers, please leave a voicemail as directed and we will return your call as soon as possible. Messages left after 4 pm will be answered the following business day.   You may also send [place] a message via MyChart.  We typically respond to MyChart messages within 1-2 business days.  For prescription refills, please ask your pharmacy to contact our office. Our fax number is [phone].  If you have an urgent issue when the clinic is closed that cannot wait until the next business day, you can page your doctor at the number below.    Please note that while we do our best to be available for urgent issues outside of office hours, we are not available 24/7.   If you have an urgent issue and are unable to reach [place], you may choose to seek medical care at your doctor's office, retail clinic, urgent care center, or emergency room.  If you have a medical emergency, please immediately call 911 or go to the emergency department.  Pager Numbers  - Dr. Gwen Pounds: [phone]  - Dr. Roseanne Reno: [phone]  - Dr. Katrinka Blazing: [phone]   In the event of inclement weather, please call our main line at [phone] for an update on the status of any delays or closures.  Dermatology Medication Tips: Please keep the boxes that topical medications come in in order to help keep track of the instructions about where and how to use these. Pharmacies typically print the medication instructions only on the boxes and not directly on the medication tubes.   If your medication is too expensive, please contact our office at [phone] option 4 or send [place] a message through MyChart.   We are  unable to tell what your co-pay for medications will be in advance as this is different depending on your insurance coverage. However, we may be able to find a substitute medication at lower cost or fill out paperwork to get insurance to cover a needed medication.   If a prior authorization is required to get your medication covered by your insurance company, please allow [place] 1-2 business days to complete this process.  Drug prices often vary depending on where the prescription is filled and some pharmacies may offer cheaper prices.  The  website www.goodrx.com contains coupons for medications through different pharmacies. The prices here do not account for what the cost may be with help from insurance (it may be cheaper with your insurance), but the website can give you the price if you did not use any insurance.  - You can print the associated coupon and take it with your prescription to the pharmacy.  - You may also stop by our office during regular business hours and pick up a GoodRx coupon card.  - If you need your prescription sent electronically to a different pharmacy, notify our office through [facility] or by phone at [phone] option 4.     Si Usted Necesita Algo Despus de Su Visita  Tambin puede enviarnos un mensaje a travs de Clinical cytogeneticist. Por lo general respondemos a los mensajes de MyChart en el transcurso de 1 a 2 das hbiles.  Para renovar recetas, por favor pida a su farmacia que se ponga en contacto con nuestra oficina. Annie Sable de fax es [place] [phone].  Si tiene un asunto urgente cuando la clnica est cerrada y que no puede esperar hasta el siguiente da hbil, puede llamar/localizar a su doctor(a) al nmero que aparece a continuacin.   Por favor, tenga en cuenta que aunque hacemos todo lo posible para estar disponibles para asuntos urgentes fuera del horario de [place], no estamos disponibles las 24 horas del da, los 7 [address]  [address] de la [place].   Si tiene un problema urgente y no puede comunicarse con nosotros, puede optar por buscar atencin mdica  en el consultorio de su doctor(a), en una clnica privada, en un centro de atencin urgente o en una sala de emergencias.  Si tiene Engineer, drilling, por favor llame inmediatamente al 911 o vaya a la sala de emergencias.  Nmeros de bper  - Dr. Gwen Pounds: [phone]  - Dra. Roseanne Reno: [phone]  - Dr. Katrinka Blazing: [phone]   En caso de inclemencias del tiempo, por favor llame a Lacy Duverney principal al [phone] para una  actualizacin sobre el [place] de cualquier retraso o cierre.  Consejos para la medicacin en dermatologa: Por favor, guarde las cajas en las que vienen los medicamentos de uso tpico para ayudarle a seguir las instrucciones sobre dnde y cmo usarlos. Las farmacias generalmente imprimen las instrucciones del medicamento slo en las cajas y no directamente en los tubos del [place].   Si su medicamento es muy caro, por favor, pngase en contacto con Rolm Gala llamando al [phone] y presione la opcin 4 o envenos un mensaje a travs de Clinical cytogeneticist.   No podemos decirle cul ser su copago por los medicamentos por adelantado ya que esto es diferente dependiendo de la cobertura de su seguro. Sin embargo, es posible que podamos encontrar un medicamento sustituto a Audiological scientist un formulario para que el seguro cubra el medicamento que se considera necesario.   Si se requiere una autorizacin previa para que su compaa  de seguros [place] su medicamento, por favor permtanos de 1 a 2 das hbiles para completar [address]  Los precios de los medicamentos varan con frecuencia dependiendo del Environmental consultant de dnde se surte la receta y alguna farmacias pueden ofrecer precios ms baratos.  El sitio web www.goodrx.com tiene cupones para medicamentos de Health and safety inspector. Los precios aqu no tienen en cuenta lo que podra costar con la ayuda del seguro (puede ser ms barato con su seguro), pero el sitio web puede darle el precio si no utiliz Tourist information centre manager.  - Puede imprimir el cupn correspondiente y llevarlo con su receta a la farmacia.  - Tambin puede pasar por nuestra oficina durante el horario de atencin regular y Education officer, museum una tarjeta de cupones de GoodRx.  - Si necesita que su receta se enve electrnicamente a una farmacia diferente, informe a nuestra oficina a travs de MyChart de [place] o por telfono llamando al [phone] y presione la opcin 4.

## 2024-01-24 ENCOUNTER — Encounter: Payer: Self-pay | Admitting: Internal Medicine

## 2024-01-24 ENCOUNTER — Encounter (HOSPITAL_COMMUNITY): Payer: Self-pay

## 2024-01-24 ENCOUNTER — Other Ambulatory Visit (HOSPITAL_COMMUNITY): Payer: Self-pay | Admitting: Psychiatry

## 2024-01-24 DIAGNOSIS — E538 Deficiency of other specified B group vitamins: Secondary | ICD-10-CM

## 2024-01-25 MED ORDER — SYRINGE (DISPOSABLE) 3 ML MISC: 1.0000 | 0 refills | Status: DC

## 2024-01-25 MED ORDER — HYPODERMIC NEEDLE 25G X 1-1/2" MISC: 2.0000 | 0 refills | Status: DC

## 2024-01-25 MED ORDER — CYANOCOBALAMIN 1000 MCG/ML IJ SOLN: INTRAMUSCULAR | 3 refills | Status: DC

## 2024-01-28 ENCOUNTER — Ambulatory Visit: Payer: PPO | Admitting: Physician Assistant

## 2024-01-30 ENCOUNTER — Other Ambulatory Visit: Payer: Self-pay | Admitting: Dermatology

## 2024-01-30 DIAGNOSIS — L42 Pityriasis rosea: Secondary | ICD-10-CM

## 2024-02-01 ENCOUNTER — Encounter: Payer: Self-pay | Admitting: Family Medicine

## 2024-02-01 NOTE — Progress Notes (Unsigned)
   Emily Diffee T. Verdis Koval, MD, CAQ Sports Medicine [facility] [address] [place], [address]  Phone: [phone]  FAX: [phone]  Emily Moon - 63 y.o. female  MRN [id number]  Date of Birth: 02/11/1961  Date: 02/03/2024  PCP: Karie Schwalbe, MD  Referral: Karie Schwalbe, MD  No chief complaint on file.  Subjective:   Emily Moon is a 63 y.o. very pleasant female patient with There is no height or weight on file to calculate BMI. who presents with the following:  Emily Moon is a very well-known patient, I have known for many years.  She presents with some ongoing right-sided hip pain.  She did have a right sided total hip arthroplasty done in 2022 by Dr. Despina Hick.  She had been a long-term patient of Dr. Dayton Martes and then [name].  She is now seeing Dr. Alphonsus Sias as PCP.    Review of Systems is noted in the HPI, as appropriate  Objective:   LMP 08/23/2014   GEN: No acute distress; alert,appropriate. PULM: Breathing comfortably in no respiratory distress PSYCH: Normally interactive.   Laboratory and Imaging Data:  Assessment and Plan:   ***

## 2024-02-02 ENCOUNTER — Ambulatory Visit: Payer: PPO | Admitting: Physician Assistant

## 2024-02-02 VITALS — BP 122/79 | HR 90

## 2024-02-02 DIAGNOSIS — N3281 Overactive bladder: Secondary | ICD-10-CM

## 2024-02-02 NOTE — Progress Notes (Signed)
 PTNS  Session # 11 (Lifetime #22)  Health & Social Factors: increased constipation with related increased urgency Caffeine: <1 Alcohol: 0 Daytime voids #per day: 11-2 Night-time voids #per night: 0-1 Urgency: strong Incontinence Episodes #per day: 0 Ankle used: right Treatment Setting: 14 Feeling/ Response: sensory Comments: Patient tolerated well.  Performed By: Carman Ching, PA-C   Follow Up: Return in about 4 weeks (around 03/01/2024) for PTNS maintenance.

## 2024-02-03 ENCOUNTER — Ambulatory Visit: Admitting: Family Medicine

## 2024-02-03 ENCOUNTER — Encounter: Payer: Self-pay | Admitting: Family Medicine

## 2024-02-03 ENCOUNTER — Ambulatory Visit
Admission: RE | Admit: 2024-02-03 | Discharge: 2024-02-03 | Disposition: A | Discharge status: Home / Self Care | Source: Ambulatory Visit | Attending: Family Medicine | Admitting: Family Medicine

## 2024-02-03 VITALS — BP 140/84 | HR 87 | Temp 97.2°F | Ht 69.0 in | Wt 234.0 lb

## 2024-02-03 DIAGNOSIS — M7062 Trochanteric bursitis, left hip: Secondary | ICD-10-CM

## 2024-02-03 DIAGNOSIS — M25551 Pain in right hip: Secondary | ICD-10-CM

## 2024-02-03 DIAGNOSIS — Z96641 Presence of right artificial hip joint: Secondary | ICD-10-CM

## 2024-02-03 DIAGNOSIS — M7061 Trochanteric bursitis, right hip: Secondary | ICD-10-CM | POA: Diagnosis not present

## 2024-02-03 DIAGNOSIS — Z96643 Presence of artificial hip joint, bilateral: Secondary | ICD-10-CM | POA: Diagnosis not present

## 2024-02-03 MED ORDER — TRIAMCINOLONE ACETONIDE 40 MG/ML IJ SUSP
40.0000 mg | Freq: Once | INTRAMUSCULAR | Status: AC
  Administered 2024-02-03: 40 mg via INTRA_ARTICULAR

## 2024-02-03 MED ORDER — TRIAMCINOLONE ACETONIDE 40 MG/ML IJ SUSP
40.0000 mg | Freq: Once | INTRAMUSCULAR | Status: AC
Start: 2024-02-03 — End: 2024-02-03
  Administered 2024-02-03: 40 mg via INTRA_ARTICULAR

## 2024-02-09 ENCOUNTER — Ambulatory Visit: Payer: PPO | Admitting: Podiatry

## 2024-02-09 ENCOUNTER — Encounter: Payer: Self-pay | Admitting: Podiatry

## 2024-02-09 DIAGNOSIS — M205X1 Other deformities of toe(s) (acquired), right foot: Secondary | ICD-10-CM

## 2024-02-09 DIAGNOSIS — M205X9 Other deformities of toe(s) (acquired), unspecified foot: Secondary | ICD-10-CM

## 2024-02-09 NOTE — Patient Instructions (Signed)
 Leave bandage in place and dry for 4 days, then remove. You may wash foot normally after removal of bandage. DO NOT SOAK FOOT! Dry completely afterwards and may use a bandaid over incision if needed. We will follow up with you in 1 weeks for recheck.

## 2024-02-09 NOTE — Progress Notes (Signed)
 She presents today for a flexor tenotomy regarding her fourth digit of her left foot.  She states that she is happy with the outcome on the right foot she has wants to go ahead and finish this left foot up completely.  Objective: Vital signs are stable alert and oriented x 3 pulses are palpable.  She has pain on palpation and end range of motion of the DIPJ fourth digit left foot.  She has a mallet toe deformity there that is flexible but does demonstrate some arthritic change.  Assessment: Mallet toe deformity fourth left.  Plan: Flexor tenotomy was performed today after local anesthetic was administered she tolerated procedure well was performed with an 18-gauge needle at the level of the DIPJ it was flushed and Dermabond was placed.  Dressed a compressive dressing was applied follow-up with her in 1 to 2 weeks for recheck.

## 2024-02-16 ENCOUNTER — Encounter: Payer: Self-pay | Admitting: Podiatry

## 2024-02-16 ENCOUNTER — Ambulatory Visit (INDEPENDENT_AMBULATORY_CARE_PROVIDER_SITE_OTHER): Admitting: Podiatry

## 2024-02-16 DIAGNOSIS — M205X9 Other deformities of toe(s) (acquired), unspecified foot: Secondary | ICD-10-CM

## 2024-02-16 DIAGNOSIS — Z9889 Other specified postprocedural states: Secondary | ICD-10-CM

## 2024-02-16 NOTE — Progress Notes (Signed)
 She presents today for follow-up of her tenotomy fourth toe left foot.  States that is doing just fine no problems whatsoever she states that with her balance issues she was not able to walk with a Darco shoe.  Objective: Vital signs are stable alert and oriented x 3.  Surgical toe is gone on to heal uneventfully and is rectus.  There is some ecchymosis associated with it.  But does not demonstrate any signs of infection.  The toe lies rectus with the third toe and the fifth toe.  Assessment: Well-healing flexor tenotomy fourth digit left foot.  Plan: Discussed etiology pathology conservative versus surgical therapies.  I will follow-up with her on an as-needed basis.  I did encourage her to wrap the toe with Coban just to stiffen it up and to hold it rectus so that it does not try to move underneath the third toe.  She will try to do so and I will follow-up with her as needed

## 2024-03-02 ENCOUNTER — Other Ambulatory Visit: Payer: Self-pay | Admitting: Physician Assistant

## 2024-03-02 DIAGNOSIS — G4733 Obstructive sleep apnea (adult) (pediatric): Secondary | ICD-10-CM | POA: Diagnosis not present

## 2024-03-06 ENCOUNTER — Ambulatory Visit: Admitting: Physician Assistant

## 2024-03-06 DIAGNOSIS — N3281 Overactive bladder: Secondary | ICD-10-CM

## 2024-03-06 NOTE — Progress Notes (Signed)
 PTNS  Session # 23  Health & Social Factors: no change Caffeine : <1 Alcohol : 0 Daytime voids #per day: 10 Night-time voids #per night: 1 Urgency: mild to strong Incontinence Episodes #per day: 0 Ankle used: right Treatment Setting: 8 Feeling/ Response: sensory Comments: Patient tolerated well.  Performed By: Ireanna Finlayson, PA-C   Follow Up: 1 month

## 2024-03-09 ENCOUNTER — Ambulatory Visit (HOSPITAL_COMMUNITY): Payer: PPO | Admitting: Clinical

## 2024-03-09 ENCOUNTER — Encounter (HOSPITAL_COMMUNITY): Payer: Self-pay | Admitting: Clinical

## 2024-03-09 DIAGNOSIS — F6089 Other specific personality disorders: Secondary | ICD-10-CM

## 2024-03-09 DIAGNOSIS — F310 Bipolar disorder, current episode hypomanic: Secondary | ICD-10-CM | POA: Diagnosis not present

## 2024-03-09 DIAGNOSIS — Z8659 Personal history of other mental and behavioral disorders: Secondary | ICD-10-CM

## 2024-03-09 DIAGNOSIS — F609 Personality disorder, unspecified: Secondary | ICD-10-CM | POA: Diagnosis not present
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
## 2024-03-15 ENCOUNTER — Ambulatory Visit (HOSPITAL_COMMUNITY): Admitting: Clinical

## 2024-03-15 ENCOUNTER — Encounter (HOSPITAL_COMMUNITY): Payer: Self-pay

## 2024-03-18 ENCOUNTER — Other Ambulatory Visit (HOSPITAL_COMMUNITY): Payer: Self-pay | Admitting: Psychiatry

## 2024-03-19 ENCOUNTER — Other Ambulatory Visit (HOSPITAL_COMMUNITY): Payer: Self-pay | Admitting: Psychiatry

## 2024-03-23 ENCOUNTER — Encounter (HOSPITAL_COMMUNITY): Payer: Self-pay | Admitting: Clinical

## 2024-03-23 ENCOUNTER — Ambulatory Visit (HOSPITAL_COMMUNITY): Payer: PPO | Admitting: Clinical

## 2024-03-23 DIAGNOSIS — Z8659 Personal history of other mental and behavioral disorders: Secondary | ICD-10-CM | POA: Diagnosis not present

## 2024-03-23 DIAGNOSIS — F310 Bipolar disorder, current episode hypomanic: Secondary | ICD-10-CM

## 2024-03-23 DIAGNOSIS — F609 Personality disorder, unspecified: Secondary | ICD-10-CM
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
## 2024-03-27 ENCOUNTER — Encounter (HOSPITAL_COMMUNITY): Payer: Self-pay

## 2024-04-03 ENCOUNTER — Ambulatory Visit: Payer: PPO | Admitting: Internal Medicine

## 2024-04-03 ENCOUNTER — Encounter: Payer: Self-pay | Admitting: Internal Medicine

## 2024-04-03 VITALS — BP 110/80 | HR 86 | Ht 68.0 in | Wt 230.0 lb

## 2024-04-03 DIAGNOSIS — F3111 Bipolar disorder, current episode manic without psychotic features, mild: Secondary | ICD-10-CM

## 2024-04-03 DIAGNOSIS — Z7985 Long-term (current) use of injectable non-insulin antidiabetic drugs: Secondary | ICD-10-CM

## 2024-04-03 DIAGNOSIS — Z1211 Encounter for screening for malignant neoplasm of colon: Secondary | ICD-10-CM

## 2024-04-03 DIAGNOSIS — Z0000 Encounter for general adult medical examination without abnormal findings: Secondary | ICD-10-CM | POA: Diagnosis not present

## 2024-04-03 DIAGNOSIS — E1159 Type 2 diabetes mellitus with other circulatory complications: Secondary | ICD-10-CM

## 2024-04-03 DIAGNOSIS — I471 Supraventricular tachycardia, unspecified: Secondary | ICD-10-CM | POA: Diagnosis not present

## 2024-04-03 DIAGNOSIS — J4489 Other specified chronic obstructive pulmonary disease: Secondary | ICD-10-CM | POA: Diagnosis not present

## 2024-04-03 LAB — HM DIABETES FOOT EXAM

## 2024-04-03 NOTE — Progress Notes (Signed)
 Subjective:    Patient ID: Emily Moon, female    DOB: [birth year], 62 y.o.   MRN: [id number]  HPI Here for physical  Notes increased fat around right side---concerned about it being fatty liver disease Has lost 17# since last year Is on the trulicity --but tried without it for a week (and went back on) Chronic constipation from this Has Freestyle Libre---69% in range and estimated A1c 5.7% Neuropathy is worse --legs are sore and cramp at times. Uses Delta-8 ---CBD relative  Some concern about cognitive decline---with screen Dr Levie Ream cares for the bipolar---and is also seeing a counselor  Ongoing skin issues Keeps up with derm  Current Outpatient Medications on File Prior to Visit  Medication Sig Dispense Refill   aspirin  EC 81 MG tablet Take 81 mg by mouth daily. Swallow whole.     Azelastine  HCl 137 MCG/SPRAY SOLN PLACE 2 SPRAYS INTO BOTH NOSTRILS 2 (TWO) TIMES DAILY 30 mL 11   B Complex-Biotin -FA (BIG 100, BIOTIN , PO) Take 300 mg by mouth at bedtime.     bisacodyl  (DULCOLAX) 5 MG EC tablet Take 5 mg by mouth daily as needed for moderate constipation.     cetirizine (ZYRTEC) 10 MG tablet Take 10 mg by mouth daily.     clindamycin  (CLEOCIN  T) 1 % external solution Apply to scalp once or twice a day prn bumps 30 mL 2   Continuous Glucose Sensor (FREESTYLE LIBRE 3 SENSOR) MISC PLACE 1 SENSOR ON THE SKIN EVERY 14 DAYS. USE TO CHECK GLUCOSE CONTINUOUSLY 6 each 12   cyanocobalamin  (VITAMIN B12) 1000 MCG/ML injection INJECT 1 ML (1,000 MCG TOTAL) INTO THE MUSCLE EVERY 30 (THIRTY) DAYS. FOR B12 VITAMIN 3 mL 3   desipramine  (NORPRAMIN ) 25 MG tablet 2  qam 180 tablet 2   diphenhydrAMINE  (BENADRYL ) 2 % cream Apply topically 3 (three) times daily as needed for itching.     Dulaglutide  (TRULICITY ) 3 MG/0.5ML SOAJ Inject 3 mL sq once weekly. 6 mL 3   DULoxetine  (CYMBALTA ) 20 MG capsule Take 2 capsules (40 mg total) by mouth daily. 180 capsule 0   estradiol  (ESTRACE ) 0.1 MG/GM vaginal  cream Estrogen Cream Instruction Discard applicator Apply pea sized amount to tip of finger to urethra before bed. Wash hands well after application. Use Monday, Wednesday and Friday 42.5 g 11   Evolocumab  (REPATHA  SURECLICK) 140 MG/ML SOAJ Inject 140 mg into the skin every 14 (fourteen) days. PLEASE CALL OFFICE TO SCHEDULE APPOINTMENT PRIOR TO NEXT REFILL 2 mL 12   fenofibrate  (TRICOR ) 145 MG tablet Take 1 tablet (145 mg total) by mouth daily. For cholesterol. Please call [phone] for an appointment. 90 tablet 3   hydrOXYzine  (VISTARIL ) 50 MG capsule Take 2 capsules (100 mg total) by mouth at bedtime. TAKE 1 CAPSLE BY MOUTH AT NOON, 1 AT 6 PM, AND 1 AS NEEDED 180 capsule 2   ibuprofen  (ADVIL ) 200 MG tablet Take 800 mg by mouth 2 (two) times daily.     ketoconazole  (NIZORAL ) 2 % shampoo Apply 1 Application topically as directed. Wash from waist up 3 times a week for 2 months, then decrease to 1 time a month, let sit 5 minutes and rinse off 120 mL 6   lamoTRIgine  (LAMICTAL ) 150 MG tablet Take 2 tablets (300 mg total) by mouth daily at bedtime. 180 tablet 0   levalbuterol  (XOPENEX  HFA) 45 MCG/ACT inhaler Inhale 2 puffs into the lungs every 4 (four) hours as needed for wheezing.     Lysine  500  MG TABS Take 1,000 mg by mouth daily.     metFORMIN  (GLUCOPHAGE ) 500 MG tablet Take 2 tabs by mouth every morning and 1 tab in the evening 270 tablet 3   methocarbamol  (ROBAXIN ) 500 MG tablet TAKE 1-2 TABLETS (500-1,000 MG TOTAL) BY MOUTH AT BEDTIME AS NEEDED FOR MUSCLE SPASMS. 180 tablet 0   metoprolol  tartrate (LOPRESSOR ) 50 MG tablet TAKE 1 TABLET BY MOUTH TWICE A DAY 180 tablet 3   montelukast  (SINGULAIR ) 10 MG tablet TAKE 1 TABLET BY MOUTH EVERYDAY AT BEDTIME 90 tablet 1   mupirocin  ointment (BACTROBAN ) 2 % Apply 1 Application topically daily. Qd to wound on finger 22 g 1   neomycin -bacitracin-polymyxin (NEOSPORIN) [phone] ointment Apply 1 application topically 4 (four) times daily as needed (for  cut/scrapes.).     NEOMYCIN -POLYMYXIN-HYDROCORTISONE (CORTISPORIN) 1 % SOLN OTIC solution Apply 1-2 drops to toe BID after soaking 10 mL 1   pantoprazole  (PROTONIX ) 40 MG tablet TAKE 1 TABLET (40 MG TOTAL) BY MOUTH 2 (TWO) TIMES DAILY BEFORE A MEAL. FOR HEARTBURN 180 tablet 3   polyvinyl alcohol  (LIQUIFILM TEARS) 1.4 % ophthalmic solution Place 2 drops into both eyes at bedtime.     Roflumilast  (ZORYVE ) 0.3 % CREA Apply 1 application  topically daily. qd to aa psoriasis on legs, arms 60 g 4   Ruxolitinib Phosphate  (OPZELURA ) 1.5 % CREA Apply to aa's rash QD PRN. 60 g 3   STIOLTO RESPIMAT  2.5-2.5 MCG/ACT AERS Inhale 2 puffs into the lungs at bedtime.     temazepam  (RESTORIL ) 30 MG capsule Take 2 capsules (60 mg total) by mouth at bedtime as needed for sleep. 60 capsule 4   traZODone  (DESYREL ) 100 MG tablet 3 qhs 90 tablet 4   valACYclovir  (VALTREX ) 1000 MG tablet Take 2 tablets by mouth twice daily for 1 day as needed for herpes outbreak. 12 tablet 0   Vitamin D3 (VITAMIN D ) 25 MCG tablet Take 3,000 Units by mouth daily.     No current facility-administered medications on file prior to visit.    Allergies  Allergen Reactions   Albuterol  Shortness Of Breath and Other (See Comments)    Makes pt feel jittery/ tacycardic   Crestor  [Rosuvastatin ] Other (See Comments)    Joint pain, muscle pain, and hair loss   Halcion [Triazolam] Other (See Comments)    Dizziness,headaches,bladder problems   Levaquin  [Levofloxacin  In D5w] Diarrhea and Itching    Shoulder pain   Naproxen Sodium     Patient tolerates in small doses. Her reaction is swelling of the lower extremities.    Ozempic  (0.25 Or 0.5 Mg-Dose) [Semaglutide (0.25 Or 0.5mg -Dos)] Itching   Cefaclor Other (See Comments)    Doesn't remember---unsure if actually allergic    Latex Itching   Prednisone      Elevates blood sugars   Sulfa  Antibiotics Itching    Unsure of reaction possibly itching   Tramadol  Itching and Nausea And Vomiting   Zinc  Other (See Comments)    constipation  constipation    Aripiprazole Other (See Comments)    Muscle tension/cramping   Diclofenac  Sodium Rash    "made very sick"    Past Medical History:  Diagnosis Date   Asthma    Bipolar disorder (HCC) 05/21/2014   Cataracts, bilateral    Chronic pain syndrome    COPD (chronic obstructive pulmonary disease) (HCC)    DDD (degenerative disc disease), cervical    also back   Depression    Diabetes mellitus without complication (HCC)  Fibromyalgia syndrome    GERD (gastroesophageal reflux disease)    Gout    Hip dysplasia, 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   History of kidney stones    Hx of sepsis    Hypercholesterolemia    IDA (iron  deficiency anemia)    Multiple sclerosis (HCC)    weakness   Osteoporosis    osteoarthritis   Prediabetes 07/22/2018   Psoriasis    Seasonal allergies    Sleep apnea 2012   sleep study / slight, no interventions   Vitamin B 12 deficiency     Past Surgical History:  Procedure Laterality Date   CATARACT EXTRACTION W/PHACO Left 05/21/2015   Procedure: CATARACT EXTRACTION PHACO AND INTRAOCULAR LENS PLACEMENT (IOC);  Surgeon: Clair Crews, MD;  Location: [facility];  Service: Ophthalmology;  Laterality: Left;  US  00:35 AP% 22.9 CDE 8.11 fluid pack lot #[id number]   CATARACT EXTRACTION W/PHACO Right 06/04/2015   Procedure: CATARACT EXTRACTION PHACO AND INTRAOCULAR LENS PLACEMENT (IOC);  Surgeon: Clair Crews, MD;  Location: [facility];  Service: Ophthalmology;  Laterality: Right;  US :00:48 AP%: 10.5 CDE:5.08 Fluid lot #[id number]   CYSTOSCOPY/URETEROSCOPY/HOLMIUM LASER/STENT PLACEMENT Bilateral 09/22/2016   Procedure: CYSTOSCOPY/URETEROSCOPY/HOLMIUM LASER/STENT PLACEMENT;  Surgeon: Dustin Gimenez, MD;  Location: [facility];  Service: Urology;  Laterality: Bilateral;   EYE SURGERY  2015   tissue biopsy   FOOT SURGERY  2015   JOINT REPLACEMENT Left 2013   hip replacement   LITHOTRIPSY     PTOSIS REPAIR Bilateral 02/18/2016    Procedure: BILATERAL PTOSIS REPAIR UPPER EYELIDS;  Surgeon: Zacarias Hermann, MD;  Location: [facility];  Service: Ophthalmology;  Laterality: Bilateral;  LEAVE PT EARLY AM   thumb surgery Right    TONSILLECTOMY  1973   TOTAL HIP ARTHROPLASTY Right 07/02/2021   Procedure: TOTAL HIP ARTHROPLASTY ANTERIOR APPROACH;  Surgeon: Liliane Rei, MD;  Location: [facility];  Service: Orthopedics;  Laterality: Right;    Family History  Problem Relation Age of Onset   Cancer Mother    Heart disease Mother    Diabetes Mother    Sleep apnea Mother    Cancer Father        Abdomen with mastasis   High Cholesterol Father    Kidney disease Neg Hx    Bladder Cancer Neg Hx    Prostate cancer Neg Hx    Kidney cancer Neg Hx     Social History   Socioeconomic History   Marital status: Divorced    Spouse name: Not on file   Number of children: 1   Years of education: Not on file   Highest education level: Bachelor's degree (e.g., BA, AB, BS)  Occupational History   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   Occupation: Disability  Tobacco Use   Smoking status: Former    Current packs/day: 0.00    Average packs/day: 1 pack/day for 25.0 years (25.0 ttl pk-yrs)    Types: Cigarettes    Start date: 63    Quit date: 2019    Years since quitting: 6.3    Passive exposure: Past   Smokeless tobacco: Never   Tobacco comments:    occasional use  Vaping Use   Vaping status: Former  Substance and Sexual Activity   Alcohol  use: No    Alcohol /week: 0.0 standard drinks of alcohol    Drug use: Yes    Types: Methylphenidate , Marijuana    Comment: Delta 8 - once per day   Sexual activity: Never  Other  Topics Concern   Not on file  Social History Narrative   Not on file   Social Drivers of Health   Financial Resource Strain: High Risk (12/21/2023)   Overall Financial Resource Strain (CARDIA)    Difficulty of Paying Living Expenses: Hard  Food Insecurity: Food  Insecurity Present (12/21/2023)   Hunger Vital Sign    Worried About Running Out of Food in the Last Year: Patient declined    Ran Out of Food in the Last Year: Often true  Transportation Needs: No Transportation Needs (12/21/2023)   PRAPARE - Administrator, Civil Service (Medical): No    Lack of Transportation (Non-Medical): No  Physical Activity: Inactive (12/21/2023)   Exercise Vital Sign    Days of Exercise per Week: 0 days    Minutes of Exercise per Session: 0 min  Stress: Stress Concern Present (12/21/2023)   [employer] of Occupational Health - Occupational Stress Questionnaire    Feeling of Stress : To some extent  Social Connections: Moderately Isolated (12/21/2023)   Social Connection and Isolation Panel [NHANES]    Frequency of Communication with Friends and Family: Three times a week    Frequency of Social Gatherings with Friends and Family: Never    Attends Religious Services: 1 to 4 times per year    Active Member of [employer] or Organizations: No    Attends Banker Meetings: Never    Marital Status: Divorced  Catering manager Violence: Not At Risk (10/28/2023)   Humiliation, Afraid, Rape, and Kick questionnaire    Fear of Current or Ex-Partner: No    Emotionally Abused: No    Physically Abused: No    Sexually Abused: No    Review of Systems  Constitutional:        Weight is down some Ongoing fatigue Wears seat belt  HENT:  Negative for hearing loss and tinnitus.        Overdue for dentist--no insurance  Eyes:  Negative for visual disturbance.       Has one close up and one far away eye since cataract surgery  Respiratory:  Negative for chest tightness and shortness of breath.        Some cough with her allergies Uses HEPA filter in her bedroom  Cardiovascular:  Negative for chest pain, palpitations and leg swelling.  Gastrointestinal:  Positive for constipation.       Some indigestion--but takes the pantoprazole  bid  Endocrine: Negative  for polydipsia and polyuria.  Genitourinary:  Positive for frequency and urgency.       Still getting PTNS Has to bear down to void at times  Musculoskeletal:  Positive for arthralgias. Negative for joint swelling.  Skin:  Positive for rash.       Keeps up with Dr Bary Likes  Allergic/Immunologic: Positive for environmental allergies. Negative for immunocompromised state.       Uses zyrtec and azelastine   Neurological:  Positive for dizziness and weakness. Negative for syncope.  Hematological:  Negative for adenopathy. Does not bruise/bleed easily.  Psychiatric/Behavioral:  Positive for dysphoric mood. The patient is nervous/anxious.        More sleep issues lately Sleeps with CPAP       Objective:   Physical Exam Constitutional:      Appearance: Normal appearance.  HENT:     Mouth/Throat:     Pharynx: No oropharyngeal exudate or posterior oropharyngeal erythema.  Eyes:     Conjunctiva/sclera: Conjunctivae normal.     Pupils: Pupils are  equal, round, and reactive to light.  Cardiovascular:     Rate and Rhythm: Normal rate and regular rhythm.     Pulses: Normal pulses.     Heart sounds: No murmur heard.    No gallop.  Pulmonary:     Effort: Pulmonary effort is normal.     Breath sounds: Normal breath sounds. No wheezing or rales.  Abdominal:     Palpations: Abdomen is soft.     Tenderness: There is no abdominal tenderness.  Musculoskeletal:     Cervical back: Neck supple.     Right lower leg: No edema.     Left lower leg: No edema.  Lymphadenopathy:     Cervical: No cervical adenopathy.  Skin:    Findings: No rash.     Comments: No foot lesions  Neurological:     General: No focal deficit present.     Mental Status: She is alert and oriented to person, place, and time.     Comments: Decreased sensation in feet  Psychiatric:        Mood and Affect: Mood normal.        Behavior: Behavior normal.            Assessment & Plan:

## 2024-04-03 NOTE — Assessment & Plan Note (Signed)
 Is on lamictal , duloxetine 

## 2024-04-03 NOTE — Assessment & Plan Note (Signed)
No recent problems  °

## 2024-04-03 NOTE — Assessment & Plan Note (Addendum)
 Breathing is stable--on tiotropium inconsistently

## 2024-04-03 NOTE — Assessment & Plan Note (Signed)
 Seems to have acceptable control  Trulicity  3mg  weekly, metformin  1000/500 Neuropathy--- desipramine /duloxetine 

## 2024-04-03 NOTE — Assessment & Plan Note (Signed)
 Will agree to colonoscopy this year Mammogram every 2 years One last pap smear closer to age 63 Flu/COVID vaccines in the fall Shingrix---had one and needs another

## 2024-04-03 NOTE — Addendum Note (Signed)
 Addended by: Bernadene Brewer on: 04/03/2024 04:04 PM   Modules accepted: Orders

## 2024-04-04 ENCOUNTER — Telehealth: Payer: Self-pay | Admitting: Internal Medicine

## 2024-04-04 ENCOUNTER — Ambulatory Visit: Payer: Self-pay | Admitting: Internal Medicine

## 2024-04-04 LAB — LIPID PANEL
Cholesterol: 115 mg/dL (ref 0–200)
HDL: 35.8 mg/dL — ABNORMAL LOW
LDL Cholesterol: 41 mg/dL (ref 0–99)
NonHDL: 78.71
Total CHOL/HDL Ratio: 3
Triglycerides: 188 mg/dL — ABNORMAL HIGH (ref 0.0–149.0)
VLDL: 37.6 mg/dL (ref 0.0–40.0)

## 2024-04-04 LAB — CBC
HCT: 37.4 % (ref 36.0–46.0)
Hemoglobin: 12.4 g/dL (ref 12.0–15.0)
MCHC: 33.2 g/dL (ref 30.0–36.0)
MCV: 91.7 fl (ref 78.0–100.0)
Platelets: 405 K/uL — ABNORMAL HIGH (ref 150.0–400.0)
RBC: 4.08 Mil/uL (ref 3.87–5.11)
RDW: 13.4 % (ref 11.5–15.5)
WBC: 5.6 K/uL (ref 4.0–10.5)

## 2024-04-04 LAB — COMPREHENSIVE METABOLIC PANEL WITH GFR
ALT: 9 U/L (ref 0–35)
AST: 10 U/L (ref 0–37)
Albumin: 4.3 g/dL (ref 3.5–5.2)
Alkaline Phosphatase: 28 U/L — ABNORMAL LOW (ref 39–117)
BUN: 20 mg/dL (ref 6–23)
CO2: 27 meq/L (ref 19–32)
Calcium: 9 mg/dL (ref 8.4–10.5)
Chloride: 102 meq/L (ref 96–112)
Creatinine, Ser: 0.91 mg/dL (ref 0.40–1.20)
GFR: 67.86 mL/min
Glucose, Bld: 117 mg/dL — ABNORMAL HIGH (ref 70–99)
Potassium: 4.3 meq/L (ref 3.5–5.1)
Sodium: 141 meq/L (ref 135–145)
Total Bilirubin: 0.4 mg/dL (ref 0.2–1.2)
Total Protein: 6.4 g/dL (ref 6.0–8.3)

## 2024-04-04 LAB — HEMOGLOBIN A1C: Hgb A1c MFr Bld: 6.1 % (ref 4.6–6.5)

## 2024-04-04 LAB — VITAMIN B12: Vitamin B-12: 275 pg/mL (ref 211–911)

## 2024-04-04 LAB — TSH: TSH: 3.99 u[IU]/mL (ref 0.35–5.50)

## 2024-04-04 NOTE — Telephone Encounter (Signed)
 Yes, that is fine.  I saw Angelo not too long ago, and we talked about it then.

## 2024-04-04 NOTE — Telephone Encounter (Signed)
 Lvmtcb. Sent mychart message

## 2024-04-04 NOTE — Telephone Encounter (Signed)
 Pt is wondering If Dr. Geralyn Knee would accept her as a new pt since Dr. Alger Infield retiring soon? Pt states she is familiar with Dr. Geralyn Knee & his care for her. Per Dr. Letvak, pt is due for 6 month f/u in November. Is this TOC okay? Please advise. Call back # [phone]

## 2024-04-05 DIAGNOSIS — G4733 Obstructive sleep apnea (adult) (pediatric): Secondary | ICD-10-CM | POA: Diagnosis not present

## 2024-04-05 NOTE — Telephone Encounter (Signed)
 Scheduled pt's visit for 10/09/24

## 2024-04-11 ENCOUNTER — Ambulatory Visit: Admitting: Physician Assistant

## 2024-04-12 ENCOUNTER — Ambulatory Visit: Admitting: Physician Assistant

## 2024-04-12 ENCOUNTER — Other Ambulatory Visit (HOSPITAL_COMMUNITY): Payer: Self-pay | Admitting: *Deleted

## 2024-04-12 MED ORDER — DULOXETINE HCL 20 MG PO CPEP: 40.0000 mg | ORAL_CAPSULE | Freq: Every day | ORAL | 0 refills | Status: DC

## 2024-04-17 ENCOUNTER — Ambulatory Visit: Admitting: Physician Assistant

## 2024-04-19 ENCOUNTER — Ambulatory Visit (INDEPENDENT_AMBULATORY_CARE_PROVIDER_SITE_OTHER): Admitting: Clinical

## 2024-04-19 ENCOUNTER — Encounter (HOSPITAL_COMMUNITY): Payer: Self-pay | Admitting: Clinical

## 2024-04-19 DIAGNOSIS — F6089 Other specific personality disorders: Secondary | ICD-10-CM

## 2024-04-19 DIAGNOSIS — Z8659 Personal history of other mental and behavioral disorders: Secondary | ICD-10-CM | POA: Diagnosis not present

## 2024-04-19 DIAGNOSIS — F310 Bipolar disorder, current episode hypomanic: Secondary | ICD-10-CM | POA: Diagnosis not present

## 2024-04-19 DIAGNOSIS — F609 Personality disorder, unspecified: Secondary | ICD-10-CM
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
## 2024-04-24 ENCOUNTER — Ambulatory Visit: Admitting: Physician Assistant

## 2024-04-25 ENCOUNTER — Other Ambulatory Visit (HOSPITAL_COMMUNITY): Payer: Self-pay | Admitting: Psychiatry

## 2024-04-26 ENCOUNTER — Other Ambulatory Visit (HOSPITAL_COMMUNITY): Payer: Self-pay | Admitting: Psychiatry

## 2024-04-26 ENCOUNTER — Other Ambulatory Visit: Payer: Self-pay

## 2024-04-26 ENCOUNTER — Ambulatory Visit (HOSPITAL_COMMUNITY): Payer: PPO | Admitting: Psychiatry

## 2024-04-26 VITALS — BP 96/65 | HR 89 | Ht 68.0 in | Wt 235.0 lb

## 2024-04-26 DIAGNOSIS — F310 Bipolar disorder, current episode hypomanic: Secondary | ICD-10-CM

## 2024-04-26 DIAGNOSIS — F329 Major depressive disorder, single episode, unspecified: Secondary | ICD-10-CM

## 2024-04-26 MED ORDER — LAMOTRIGINE 150 MG PO TABS: ORAL_TABLET | ORAL | 5 refills | Status: DC

## 2024-04-26 MED ORDER — DESIPRAMINE HCL 25 MG PO TABS: ORAL_TABLET | ORAL | 2 refills | Status: DC

## 2024-04-26 MED ORDER — TRAZODONE HCL 100 MG PO TABS: ORAL_TABLET | ORAL | 4 refills | Status: DC

## 2024-04-26 MED ORDER — TEMAZEPAM 30 MG PO CAPS: 60.0000 mg | ORAL_CAPSULE | Freq: Every evening | ORAL | 4 refills | Status: DC | PRN

## 2024-04-26 MED ORDER — DULOXETINE HCL 20 MG PO CPEP: 40.0000 mg | ORAL_CAPSULE | Freq: Every day | ORAL | 5 refills | Status: DC

## 2024-04-26 MED ORDER — LAMOTRIGINE 150 MG PO TABS: ORAL_TABLET | ORAL | 3 refills | Status: DC

## 2024-04-26 MED ORDER — DULOXETINE HCL 20 MG PO CPEP: 40.0000 mg | ORAL_CAPSULE | Freq: Every day | ORAL | 0 refills | Status: DC

## 2024-04-26 NOTE — Progress Notes (Signed)
 The most painful.  This  Psychiatric Initial Adult Assessment   Patient Identification: Emily Moon MRN:  [id number] Date of Evaluation:  04/26/2024 Referral Source: From the community Chief Complaint: Feels exhausted        Today the patient is seen in the office.  She seems to be at her baseline.  Once grade is that she is now talking therapy with one of her counselors.  I think it is helpful.  The patient watches streaming shows up until 3 or 4 in the morning and then she takes 60 mg of Restoril  300 mg of trazodone  and within a half an hour she is asleep.  She will sleep a good 8 hours.  Her chronic complaint is that she is fatigued.  She believes is related to her MS.  She sees her neurologist in about 1 month.  She also describes chronic pain in her legs.  She is taking some delta 8 which she says is helpful.  She has some problems getting her medications and we will adjust that.  In addition to Restoril  and trazodone  the patient is also on a CPAP machine.  The patient has a lot of assistance in her home.    Procedure(s) Performed: * No surgery found *  Anesthes  Patient location:   Post pain:   Post assessment:   Last Vitals:  Vitals:   04/26/24 1335  BP: 96/65  Pulse: 89     Post vital signs:   Level of consciousness:   Complications: . Associated Signs/Symptoms: Depression Symptoms:  hopelessness, (Hypo) Manic Symptoms:   Anxiety Symptoms:   Psychotic Symptoms:   PTSD Symptoms:   Past Psychiatric History: The patient was seen in a psychiatric office and takes multiple medications she's not been recently hospitalized.  Previous Psychotropic Medications: Yes   Substance Abuse History in the last 12 months:  No.  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   History of kidney stones    Hx of sepsis    Hypercholesterolemia    IDA (iron  deficiency anemia)    Multiple sclerosis (HCC)    weakness   Osteoporosis    osteoarthritis   Prediabetes 07/22/2018   Psoriasis    Seasonal allergies    Sleep apnea 2012   sleep study / slight, no interventions   Vitamin B 12 deficiency     Past Surgical History:  Procedure Laterality Date   CATARACT EXTRACTION W/PHACO Left 05/21/2015   Procedure: CATARACT EXTRACTION PHACO AND INTRAOCULAR LENS PLACEMENT (IOC);  Surgeon: Clair Crews, MD;  Location: [facility];  Service: Ophthalmology;  Laterality: Left;  US  00:35 AP% 22.9 CDE 8.11 fluid pack lot #[id number]   CATARACT EXTRACTION W/PHACO Right 06/04/2015   Procedure: CATARACT EXTRACTION PHACO AND INTRAOCULAR LENS PLACEMENT (IOC);  Surgeon: Clair Crews, MD;  Location: [facility];  Service: Ophthalmology;  Laterality: Right;  US :00:48 AP%: 10.5 CDE:5.08 Fluid lot #[id number]   CYSTOSCOPY/URETEROSCOPY/HOLMIUM LASER/STENT PLACEMENT Bilateral 09/22/2016   Procedure: CYSTOSCOPY/URETEROSCOPY/HOLMIUM LASER/STENT PLACEMENT;  Surgeon: Odilia Bennett  Ace Holder, MD;  Location: [facility];  Service: Urology;  Laterality: Bilateral;   EYE SURGERY  2015   tissue biopsy   FOOT SURGERY  2015   JOINT REPLACEMENT Left 2013   hip replacement   LITHOTRIPSY     PTOSIS REPAIR Bilateral 02/18/2016   Procedure: BILATERAL PTOSIS REPAIR UPPER EYELIDS;  Surgeon: Zacarias Hermann, MD;  Location: [facility];  Service: Ophthalmology;  Laterality: Bilateral;  LEAVE PT EARLY AM   thumb surgery Right    TONSILLECTOMY  1973   TOTAL HIP ARTHROPLASTY Right 07/02/2021   Procedure: TOTAL HIP ARTHROPLASTY ANTERIOR APPROACH;  Surgeon:  Liliane Rei, MD;  Location: [facility];  Service: Orthopedics;  Laterality: Right;    Family Psychiatric History:   Family History:  Family History  Problem Relation Age of Onset   Cancer Mother    Heart disease Mother    Diabetes Mother    Sleep apnea Mother    Cancer Father        Abdomen with mastasis   High Cholesterol Father    Kidney disease Neg Hx    Bladder Cancer Neg Hx    Prostate cancer Neg Hx    Kidney cancer Neg Hx     Social History:   Social History   Socioeconomic History   Marital status: Divorced    Spouse name: Not on file   Number of children: 1   Years of education: Not on file   Highest education level: Bachelor's degree (e.g., BA, AB, BS)  Occupational History   Occupation: Clinical biochemist Rep at [employer]    Employer: OTHER   Occupation: Disability  Tobacco Use   Smoking status: Former    Current packs/day: 0.00    Average packs/day: 1 pack/day for 25.0 years (25.0 ttl pk-yrs)    Types: Cigarettes    Start date: 40    Quit date: 2019    Years since quitting: 6.4    Passive exposure: Past   Smokeless tobacco: Never   Tobacco comments:    occasional use  Vaping Use   Vaping status: Former  Substance and Sexual Activity   Alcohol  use: No    Alcohol /week: 0.0 standard drinks of alcohol    Drug use: Yes    Types: Methylphenidate , Marijuana    Comment: Delta 8 - once per day   Sexual activity: Never  Other Topics Concern   Not on file  Social History Narrative   Not on file   Social Drivers of Health   Financial Resource Strain: High Risk (12/21/2023)   Overall Financial Resource Strain (CARDIA)    Difficulty of Paying Living Expenses: Hard  Food Insecurity: Food Insecurity Present (12/21/2023)   Hunger Vital Sign    Worried About Running Out of Food in the Last Year: Patient declined    Ran Out of Food in the Last Year: Often true  Transportation Needs: No Transportation Needs (12/21/2023)   PRAPARE - Scientist, research (physical sciences) (Medical): No    Lack of Transportation (Non-Medical): No  Physical Activity: Inactive (12/21/2023)   Exercise Vital Sign    Days of Exercise per Week: 0 days    Minutes of Exercise per Session: 0 min  Stress: Stress Concern Present (12/21/2023)   [employer] of Occupational Health - Occupational Stress Questionnaire    Feeling of Stress : To some extent  Social Connections: Moderately Isolated (12/21/2023)   Social Connection and Isolation Panel [NHANES]    Frequency of Communication  with Friends and Family: Three times a week    Frequency of Social Gatherings with Friends and Family: Never    Attends Religious Services: 1 to 4 times per year    Active Member of [employer] or Organizations: No    Attends Banker Meetings: Never    Marital Status: Divorced    Additional Social History:   Allergies:   Allergies  Allergen Reactions   Albuterol  Shortness Of Breath and Other (See Comments)    Makes pt feel jittery/ tacycardic   Crestor  [Rosuvastatin ] Other (See Comments)    Joint pain, muscle pain, and hair loss   Halcion [Triazolam] Other (See Comments)    Dizziness,headaches,bladder problems   Levaquin  [Levofloxacin  In D5w] Diarrhea and Itching    Shoulder pain   Naproxen Sodium     Patient tolerates in small doses. Her reaction is swelling of the lower extremities.    Ozempic  (0.25 Or 0.5 Mg-Dose) [Semaglutide (0.25 Or 0.5mg -Dos)] Itching   Cefaclor Other (See Comments)    Doesn't remember---unsure if actually allergic    Latex Itching   Prednisone      Elevates blood sugars   Sulfa  Antibiotics Itching    Unsure of reaction possibly itching   Tramadol  Itching and Nausea And Vomiting   Zinc Other (See Comments)    constipation  constipation    Aripiprazole Other (See Comments)    Muscle tension/cramping   Diclofenac  Sodium Rash    made very sick    Metabolic Disorder Labs: Lab Results  Component Value Date   HGBA1C 6.1 04/03/2024   MPG  142.72 07/02/2021   No results found for: PROLACTIN Lab Results  Component Value Date   CHOL 115 04/03/2024   TRIG 188.0 (H) 04/03/2024   HDL 35.80 (L) 04/03/2024   CHOLHDL 3 04/03/2024   VLDL 37.6 04/03/2024   LDLCALC 41 04/03/2024   LDLCALC 56 03/23/2023     Current Medications: Current Outpatient Medications  Medication Sig Dispense Refill   aspirin  EC 81 MG tablet Take 81 mg by mouth daily. Swallow whole.     Azelastine  HCl 137 MCG/SPRAY SOLN PLACE 2 SPRAYS INTO BOTH NOSTRILS 2 (TWO) TIMES DAILY 30 mL 11   B Complex-Biotin -FA (BIG 100, BIOTIN , PO) Take 300 mg by mouth at bedtime.     bisacodyl  (DULCOLAX) 5 MG EC tablet Take 5 mg by mouth daily as needed for moderate constipation.     cetirizine (ZYRTEC) 10 MG tablet Take 10 mg by mouth daily.     clindamycin  (CLEOCIN  T) 1 % external solution Apply to scalp once or twice a day prn bumps 30 mL 2   Continuous Glucose Sensor (FREESTYLE LIBRE 3 SENSOR) MISC PLACE 1 SENSOR ON THE SKIN EVERY 14 DAYS. USE TO CHECK GLUCOSE CONTINUOUSLY 6 each 12   cyanocobalamin  (VITAMIN B12) 1000 MCG/ML injection INJECT 1 ML (1,000 MCG TOTAL) INTO THE MUSCLE EVERY 30 (THIRTY) DAYS. FOR B12 VITAMIN 3 mL 3   diphenhydrAMINE  (BENADRYL ) 2 % cream Apply topically 3 (three) times daily as needed for itching.     Dulaglutide  (TRULICITY ) 3 MG/0.5ML SOAJ Inject 3 mL sq once weekly. 6 mL 3   estradiol  (ESTRACE ) 0.1 MG/GM vaginal cream Estrogen Cream Instruction Discard applicator Apply pea sized amount to tip of finger to urethra before bed. Wash hands well after application. Use Monday, Wednesday and Friday 42.5 g 11   Evolocumab  (REPATHA  SURECLICK) 140 MG/ML SOAJ Inject 140 mg into the skin every 14 (fourteen) days. PLEASE CALL OFFICE TO SCHEDULE  APPOINTMENT PRIOR TO NEXT REFILL 2 mL 12   fenofibrate  (TRICOR ) 145 MG tablet Take 1 tablet (145 mg total) by mouth daily. For cholesterol. Please call [phone] for an appointment. 90 tablet 3   hydrOXYzine   (VISTARIL ) 50 MG capsule Take 2 capsules (100 mg total) by mouth at bedtime. TAKE 1 CAPSLE BY MOUTH AT NOON, 1 AT 6 PM, AND 1 AS NEEDED 180 capsule 2   ibuprofen  (ADVIL ) 200 MG tablet Take 800 mg by mouth 2 (two) times daily.     ketoconazole  (NIZORAL ) 2 % shampoo Apply 1 Application topically as directed. Wash from waist up 3 times a week for 2 months, then decrease to 1 time a month, let sit 5 minutes and rinse off 120 mL 6   levalbuterol  (XOPENEX  HFA) 45 MCG/ACT inhaler Inhale 2 puffs into the lungs every 4 (four) hours as needed for wheezing.     Lysine  500 MG TABS Take 1,000 mg by mouth daily.     metFORMIN  (GLUCOPHAGE ) 500 MG tablet Take 2 tabs by mouth every morning and 1 tab in the evening 270 tablet 3   methocarbamol  (ROBAXIN ) 500 MG tablet TAKE 1-2 TABLETS (500-1,000 MG TOTAL) BY MOUTH AT BEDTIME AS NEEDED FOR MUSCLE SPASMS. 180 tablet 0   metoprolol  tartrate (LOPRESSOR ) 50 MG tablet TAKE 1 TABLET BY MOUTH TWICE A DAY 180 tablet 3   montelukast  (SINGULAIR ) 10 MG tablet TAKE 1 TABLET BY MOUTH EVERYDAY AT BEDTIME 90 tablet 1   mupirocin  ointment (BACTROBAN ) 2 % Apply 1 Application topically daily. Qd to wound on finger 22 g 1   neomycin -bacitracin-polymyxin (NEOSPORIN) [phone] ointment Apply 1 application topically 4 (four) times daily as needed (for cut/scrapes.).     NEOMYCIN -POLYMYXIN-HYDROCORTISONE (CORTISPORIN) 1 % SOLN OTIC solution Apply 1-2 drops to toe BID after soaking 10 mL 1   pantoprazole  (PROTONIX ) 40 MG tablet TAKE 1 TABLET (40 MG TOTAL) BY MOUTH 2 (TWO) TIMES DAILY BEFORE A MEAL. FOR HEARTBURN 180 tablet 3   polyvinyl alcohol  (LIQUIFILM TEARS) 1.4 % ophthalmic solution Place 2 drops into both eyes at bedtime.     Roflumilast  (ZORYVE ) 0.3 % CREA Apply 1 application  topically daily. qd to aa psoriasis on legs, arms 60 g 4   Ruxolitinib Phosphate  (OPZELURA ) 1.5 % CREA Apply to aa's rash QD PRN. 60 g 3   STIOLTO RESPIMAT  2.5-2.5 MCG/ACT AERS Inhale 2 puffs into the lungs at  bedtime.     valACYclovir  (VALTREX ) 1000 MG tablet Take 2 tablets by mouth twice daily for 1 day as needed for herpes outbreak. 12 tablet 0   Vitamin D3 (VITAMIN D ) 25 MCG tablet Take 3,000 Units by mouth daily.     desipramine  (NORPRAMIN ) 25 MG tablet 2  qam 180 tablet 2   DULoxetine  (CYMBALTA ) 20 MG capsule Take 2 capsules (40 mg total) by mouth daily. 60 capsule 5   lamoTRIgine  (LAMICTAL ) 150 MG tablet Take 2 tablets (300 mg total) by mouth daily at bedtime. 180 tablet 5   temazepam  (RESTORIL ) 30 MG capsule Take 2 capsules (60 mg total) by mouth at bedtime as needed for sleep. 60 capsule 4   traZODone  (DESYREL ) 100 MG tablet 3 qhs 90 tablet 4   No current facility-administered medications for this visit.    Neurologic: Headache: No Seizure: No Paresthesias:No  Musculoskeletal: Strength & Muscle Tone: within normal limits Gait & Station: normal Patient leans: N/A  Psychiatric Specialty Exam: ROS  Blood pressure 96/65, pulse 89, height 5' 8 (1.727 m), weight  235 lb (106.6 kg), last menstrual period 08/23/2014.Body mass index is 35.73 kg/m.  General Appearance: Casual  Eye Contact:  Good  Speech:  Clear and Coherent  Volume:  Normal  Mood:  Negative  Affect:  Appropriate  Thought Process:  Goal Directed  Orientation:  NA  Thought Content:  Logical  Suicidal Thoughts:  No  Homicidal Thoughts:  No  Memory:  Negative  Judgement:  Good  Insight:  Good  Psychomotor Activity:  Normal  Concentration:    Recall:    Fund of Knowledge:Good  Language: Good  Akathisia:  No  Handed:  Right  AIMS (if indicated):    Assets:  Desire for Improvement  ADL's:  Intact  Cognition: WNL  Sleep:      6/11/20252:11 PM    This patient's diagnosis is major depression.  She continues taking 40 mg of Cymbalta  and 50 mg of desipramine .  The patient also has some mood instability and takes Lamictal  for this condition and seems to help her.  Her third problem is insomnia.  The patient  continues taking a high dose Restoril  60 mg 300 mg of trazodone  as well as CPAP machine.  She has not had any falls.  I do suspect that the high dose of trazodone  and Restoril  might in fact affect her daytime energy level.  The patient is resistant to make any changes.  Any efforts in the past to reduce her Restoril  has led her to be extremely anxious and afflicted.  Higher doses of Cymbalta  had detrimental effects.  This patient will be seen again in 3 months.  She will continue in one-to-one therapy.

## 2024-04-28 ENCOUNTER — Ambulatory Visit: Admitting: Physician Assistant

## 2024-04-28 DIAGNOSIS — N3281 Overactive bladder: Secondary | ICD-10-CM

## 2024-04-28 NOTE — Progress Notes (Signed)
 PTNS  Session # 24  Health & Social Factors: no change Caffeine : 1 Alcohol : 0 Daytime voids #per day: 40 Night-time voids #per night: 3 Urgency: strong Incontinence Episodes #per day: 0 Ankle used: right Treatment Setting: 7 Feeling/ Response: both Comments: Patient tolerated well.  Performed By: Sopheap Basic, PA-C   Additional notes: She continues to have extreme daytime frequency but her sleeping hours are rather spared. This may be due to polypharmacy to sleep, but she is not having nocturnal enuresis. I question an element of pelvic floor dysfunction. Will send for pelvic floor PT to augment PTNS maintenance. She agrees.  Follow Up: 1 month

## 2024-05-03 ENCOUNTER — Other Ambulatory Visit: Payer: Self-pay | Admitting: Family

## 2024-05-03 DIAGNOSIS — K219 Gastro-esophageal reflux disease without esophagitis: Secondary | ICD-10-CM

## 2024-05-16 ENCOUNTER — Other Ambulatory Visit (HOSPITAL_COMMUNITY): Payer: Self-pay | Admitting: Psychiatry

## 2024-05-17 ENCOUNTER — Encounter (HOSPITAL_COMMUNITY): Payer: Self-pay | Admitting: Clinical

## 2024-05-17 ENCOUNTER — Ambulatory Visit (HOSPITAL_COMMUNITY): Admitting: Clinical

## 2024-05-17 DIAGNOSIS — Z8659 Personal history of other mental and behavioral disorders: Secondary | ICD-10-CM

## 2024-05-17 DIAGNOSIS — F609 Personality disorder, unspecified: Secondary | ICD-10-CM | POA: Diagnosis not present

## 2024-05-17 DIAGNOSIS — F314 Bipolar disorder, current episode depressed, severe, without psychotic features: Secondary | ICD-10-CM
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
## 2024-05-23 ENCOUNTER — Telehealth: Payer: Self-pay | Admitting: Internal Medicine

## 2024-05-23 DIAGNOSIS — R531 Weakness: Secondary | ICD-10-CM | POA: Diagnosis not present

## 2024-05-23 DIAGNOSIS — R413 Other amnesia: Secondary | ICD-10-CM | POA: Diagnosis not present

## 2024-05-23 DIAGNOSIS — R5383 Other fatigue: Secondary | ICD-10-CM | POA: Diagnosis not present

## 2024-05-23 DIAGNOSIS — F309 Manic episode, unspecified: Secondary | ICD-10-CM | POA: Diagnosis not present

## 2024-05-23 DIAGNOSIS — F32A Depression, unspecified: Secondary | ICD-10-CM | POA: Diagnosis not present

## 2024-05-23 DIAGNOSIS — R200 Anesthesia of skin: Secondary | ICD-10-CM | POA: Diagnosis not present

## 2024-05-23 DIAGNOSIS — G35 Multiple sclerosis: Secondary | ICD-10-CM | POA: Diagnosis not present

## 2024-05-23 DIAGNOSIS — K219 Gastro-esophageal reflux disease without esophagitis: Secondary | ICD-10-CM

## 2024-05-23 NOTE — Telephone Encounter (Unsigned)
 Copied from CRM [id number]. Topic: Clinical - Medication Refill >> May 23, 2024  2:17 PM Chasity T wrote: Medication: pantoprazole  (PROTONIX ) 40 MG tablet   Has the patient contacted their pharmacy? Yes   This is the patient's preferred pharmacy:  [id number] [address] [facility] - [address] [place] [address] Phone: [phone] Fax: [phone]  Is this the correct pharmacy for this prescription? Yes If no, delete pharmacy and type the correct one.   Has the prescription been filled recently? No  Is the patient out of the medication? Yes  Has the patient been seen for an appointment in the last year OR does the patient have an upcoming appointment? Yes  Can we respond through MyChart? Yes  Agent: Please be advised that Rx refills may take up to 3 business days. We ask that you follow-up with your pharmacy.

## 2024-05-24 MED ORDER — PANTOPRAZOLE SODIUM 40 MG PO TBEC: 40.0000 mg | DELAYED_RELEASE_TABLET | Freq: Two times a day (BID) | ORAL | 3 refills | Status: AC

## 2024-05-24 NOTE — Telephone Encounter (Signed)
 Copied from CRM [id number]. Topic: Clinical - Medication Refill >> May 23, 2024  2:17 PM Chasity T wrote: Medication: pantoprazole  (PROTONIX ) 40 MG tablet   Has the patient contacted their pharmacy? Yes   This is the patient's preferred pharmacy:  [id number] [address] [facility] - [address] [place] [address] Phone: [phone] Fax: [phone]  Is this the correct pharmacy for this prescription? Yes If no, delete pharmacy and type the correct one.   Has the prescription been filled recently? No  Is the patient out of the medication? Yes  Has the patient been seen for an appointment in the last year OR does the patient have an upcoming appointment? Yes  Can we respond through MyChart? Yes  Agent: Please be advised that Rx refills may take up to 3 business days. We ask that you follow-up with your pharmacy.

## 2024-05-24 NOTE — Telephone Encounter (Signed)
 Rx sent electronically.

## 2024-05-24 NOTE — Addendum Note (Signed)
 Addended by: KALLIE CLOTILDA SQUIBB on: 05/24/2024 02:52 PM   Modules accepted: Orders

## 2024-05-31 NOTE — Progress Notes (Deleted)
 PTNS  Session # Monthly Maintenance   Health & Social Factors: *** Caffeine : *** Alcohol : *** Daytime voids #per day: *** Night-time voids #per night: *** Urgency: *** Incontinence Episodes #per day: *** Ankle used: *** Treatment Setting: *** Feeling/ Response: *** Comments: ***  Performed By: ***  Follow Up: One month for PTNS maintenance PTNS

## 2024-06-01 ENCOUNTER — Ambulatory Visit: Admitting: Urology

## 2024-06-01 DIAGNOSIS — N3281 Overactive bladder: Secondary | ICD-10-CM

## 2024-06-02 ENCOUNTER — Ambulatory Visit: Admitting: Urology

## 2024-06-12 ENCOUNTER — Ambulatory Visit: Attending: Cardiology | Admitting: Cardiology

## 2024-06-12 ENCOUNTER — Encounter: Payer: Self-pay | Admitting: Cardiology

## 2024-06-12 VITALS — BP 97/67 | HR 88 | Ht 68.0 in | Wt 234.0 lb

## 2024-06-12 DIAGNOSIS — Z823 Family history of stroke: Secondary | ICD-10-CM

## 2024-06-12 DIAGNOSIS — E782 Mixed hyperlipidemia: Secondary | ICD-10-CM

## 2024-06-12 DIAGNOSIS — I4711 Inappropriate sinus tachycardia, so stated: Secondary | ICD-10-CM

## 2024-06-12 NOTE — Patient Instructions (Addendum)
 Medication Instructions:  Your physician recommends that you continue on your current medications as directed. Please refer to the Current Medication list given to you today.   *If you need a refill on your cardiac medications before your next appointment, please call your pharmacy*  Lab Work: No labs ordered today  If you have labs (blood work) drawn today and your tests are completely normal, you will receive your results only by: MyChart Message (if you have MyChart) OR A paper copy in the mail If you have any lab test that is abnormal or we need to change your treatment, we will call you to review the results.  Testing/Procedures: Carotid ultrasound   Follow-Up: At [facility], you and your health needs are our priority.  As part of our continuing mission to provide you with exceptional heart care, our providers are all part of one team.  This team includes your primary Cardiologist (physician) and Advanced Practice Providers or APPs (Physician Assistants and Nurse Practitioners) who all work together to provide you with the care you need, when you need it.  Your next appointment:   1 year(s)  Provider:   You may see Redell Cave, MD or one of the following Advanced Practice Providers on your designated Care Team:   Lonni Meager, NP Lesley Maffucci, PA-C Bernardino Bring, PA-C Cadence Lyndon, PA-C Tylene Lunch, NP Barnie Hila, NP    We recommend signing up for the patient portal called MyChart.  Sign up information is provided on this After Visit Summary.  MyChart is used to connect with patients for Virtual Visits (Telemedicine).  Patients are able to view lab/test results, encounter notes, upcoming appointments, etc.  Non-urgent messages can be sent to your provider as well.   To learn more about what you can do with MyChart, go to [email or web address].

## 2024-06-12 NOTE — Progress Notes (Signed)
 Cardiology Office Note:    Date:  06/12/2024   ID:  Emily Moon, DOB February 04, 1961, MRN [id number]  PCP:  Jimmy Charlie FERNS, MD  [facility] Cardiologist:  Redell Cave, MD  [facility] Electrophysiologist:  None   Referring MD: Jimmy Charlie FERNS, MD   Chief Complaint  Patient presents with   Follow-up    Patient states that experiences edema in her feet. She also wants to be checked out for stroke. Meds reviewed.      History of Present Illness:    Emily Moon is a 63 y.o. female with a hx of asthma, bipolar, OSA, diabetes, hyperlipidemia, GERD, former smoker x15 years, multiple myeloma who presents for follow-up.    Patient is being seen for hyperlipidemia and inappropriate sinus tachycardia.  Previously did not tolerate higher doses of Toprol -XL above 75 mg.  Toprol -XL was switched to Lopressor  50 mg twice daily with good effect.  Her heart rate seems better controlled.  Has a family history of stroke in mother, wants to get screened for carotid disease.  Has neck and back pain, radiating down right arm, also has some memory loss, follows up with neurology.  Prior notes. Cardiac monitor 09/2021 average heart rate 90-93.  No sustained arrhythmias. Echo 05/2021 EF 60 to 65% She has obstructive pulmonary disease and is seeing pulmonary medicine for this.  She used to follow-up in [place] at the hyperlipidemia clinic where Repatha  was started.  She has allergies to statins.  Established care in [place] for better commute.  Past Medical History:  Diagnosis Date   Asthma    Bipolar disorder (HCC) 05/21/2014   Cataracts, bilateral    Chronic pain syndrome    COPD (chronic obstructive pulmonary disease) (HCC)    DDD (degenerative disc disease), cervical    also back   Depression    Diabetes mellitus without complication (HCC)    Fibromyalgia syndrome    GERD (gastroesophageal reflux disease)    Gout    Hip dysplasia, congenital 09/15/2013   History of  kidney stones    Hx of sepsis    Hypercholesterolemia    IDA (iron  deficiency anemia)    Multiple sclerosis (HCC)    weakness   Osteoporosis    osteoarthritis   Prediabetes 07/22/2018   Psoriasis    Seasonal allergies    Sleep apnea 2012   sleep study / slight, no interventions   Vitamin B 12 deficiency     Past Surgical History:  Procedure Laterality Date   CATARACT EXTRACTION W/PHACO Left 05/21/2015   Procedure: CATARACT EXTRACTION PHACO AND INTRAOCULAR LENS PLACEMENT (IOC);  Surgeon: Elsie Carmine, MD;  Location: [facility] ORS;  Service: Ophthalmology;  Laterality: Left;  US  00:35 AP% 22.9 CDE 8.11 fluid pack lot #[id number]   CATARACT EXTRACTION W/PHACO Right 06/04/2015   Procedure: CATARACT EXTRACTION PHACO AND INTRAOCULAR LENS PLACEMENT (IOC);  Surgeon: Elsie Carmine, MD;  Location: [facility];  Service: Ophthalmology;  Laterality: Right;  US :00:48 AP%: 10.5 CDE:5.08 Fluid lot #[id number]   CYSTOSCOPY/URETEROSCOPY/HOLMIUM LASER/STENT PLACEMENT Bilateral 09/22/2016   Procedure: CYSTOSCOPY/URETEROSCOPY/HOLMIUM LASER/STENT PLACEMENT;  Surgeon: Rosina Riis, MD;  Location: [facility] ORS;  Service: Urology;  Laterality: Bilateral;   EYE SURGERY  2015   tissue biopsy   FOOT SURGERY  2015   JOINT REPLACEMENT Left 2013   hip replacement   LITHOTRIPSY     PTOSIS REPAIR Bilateral 02/18/2016   Procedure: BILATERAL PTOSIS REPAIR UPPER EYELIDS;  Surgeon: Greig CHRISTELLA Gay, MD;  Location: [facility];  Service: Ophthalmology;  Laterality: Bilateral;  LEAVE PT EARLY AM   thumb surgery Right    TONSILLECTOMY  1973   TOTAL HIP ARTHROPLASTY Right 07/02/2021   Procedure: TOTAL HIP ARTHROPLASTY ANTERIOR APPROACH;  Surgeon: Melodi Lerner, MD;  Location: [facility];  Service: Orthopedics;  Laterality: Right;    Current Medications: Current Meds  Medication Sig   aspirin  EC 81 MG tablet Take 81 mg by mouth daily. Swallow whole.   Azelastine  HCl 137 MCG/SPRAY SOLN PLACE 2 SPRAYS INTO BOTH NOSTRILS 2  (TWO) TIMES DAILY   B Complex-Biotin -FA (BIG 100, BIOTIN , PO) Take 300 mg by mouth at bedtime.   bisacodyl  (DULCOLAX) 5 MG EC tablet Take 5 mg by mouth daily as needed for moderate constipation.   cetirizine (ZYRTEC) 10 MG tablet Take 10 mg by mouth daily.   clindamycin  (CLEOCIN  T) 1 % external solution Apply to scalp once or twice a day prn bumps   Continuous Glucose Sensor (FREESTYLE LIBRE 3 SENSOR) MISC PLACE 1 SENSOR ON THE SKIN EVERY 14 DAYS. USE TO CHECK GLUCOSE CONTINUOUSLY   cyanocobalamin  (VITAMIN B12) 1000 MCG/ML injection INJECT 1 ML (1,000 MCG TOTAL) INTO THE MUSCLE EVERY 30 (THIRTY) DAYS. FOR B12 VITAMIN   desipramine  (NORPRAMIN ) 25 MG tablet 2  qam   diphenhydrAMINE  (BENADRYL ) 2 % cream Apply topically 3 (three) times daily as needed for itching.   Dulaglutide  (TRULICITY ) 3 MG/0.5ML SOAJ Inject 3 mL sq once weekly.   DULoxetine  (CYMBALTA ) 20 MG capsule Take 2 capsules (40 mg total) by mouth daily.   estradiol  (ESTRACE ) 0.1 MG/GM vaginal cream Estrogen Cream Instruction Discard applicator Apply pea sized amount to tip of finger to urethra before bed. Wash hands well after application. Use Monday, Wednesday and Friday   Evolocumab  (REPATHA  SURECLICK) 140 MG/ML SOAJ Inject 140 mg into the skin every 14 (fourteen) days. PLEASE CALL OFFICE TO SCHEDULE APPOINTMENT PRIOR TO NEXT REFILL   fenofibrate  (TRICOR ) 145 MG tablet Take 1 tablet (145 mg total) by mouth daily. For cholesterol. Please call [phone] for an appointment.   hydrOXYzine  (VISTARIL ) 50 MG capsule Take 2 capsules (100 mg total) by mouth at bedtime. TAKE 1 CAPSLE BY MOUTH AT NOON, 1 AT 6 PM, AND 1 AS NEEDED   ibuprofen  (ADVIL ) 200 MG tablet Take 800 mg by mouth 2 (two) times daily.   ketoconazole  (NIZORAL ) 2 % shampoo Apply 1 Application topically as directed. Wash from waist up 3 times a week for 2 months, then decrease to 1 time a month, let sit 5 minutes and rinse off   lamoTRIgine  (LAMICTAL ) 150 MG tablet Take 2 tablets  (300 mg total) by mouth daily at bedtime.   levalbuterol  (XOPENEX  HFA) 45 MCG/ACT inhaler Inhale 2 puffs into the lungs every 4 (four) hours as needed for wheezing.   Lysine  500 MG TABS Take 1,000 mg by mouth daily.   metFORMIN  (GLUCOPHAGE ) 500 MG tablet Take 2 tabs by mouth every morning and 1 tab in the evening   methocarbamol  (ROBAXIN ) 500 MG tablet TAKE 1-2 TABLETS (500-1,000 MG TOTAL) BY MOUTH AT BEDTIME AS NEEDED FOR MUSCLE SPASMS.   metoprolol  tartrate (LOPRESSOR ) 50 MG tablet TAKE 1 TABLET BY MOUTH TWICE A DAY   montelukast  (SINGULAIR ) 10 MG tablet TAKE 1 TABLET BY MOUTH EVERYDAY AT BEDTIME   mupirocin  ointment (BACTROBAN ) 2 % Apply 1 Application topically daily. Qd to wound on finger   neomycin -bacitracin-polymyxin (NEOSPORIN) [phone] ointment Apply 1 application topically 4 (four) times daily as needed (for cut/scrapes.).  NEOMYCIN -POLYMYXIN-HYDROCORTISONE (CORTISPORIN) 1 % SOLN OTIC solution Apply 1-2 drops to toe BID after soaking   pantoprazole  (PROTONIX ) 40 MG tablet Take 1 tablet (40 mg total) by mouth 2 (two) times daily before a meal. For heartburn   polyvinyl alcohol  (LIQUIFILM TEARS) 1.4 % ophthalmic solution Place 2 drops into both eyes at bedtime.   Roflumilast  (ZORYVE ) 0.3 % CREA Apply 1 application  topically daily. qd to aa psoriasis on legs, arms   Ruxolitinib Phosphate  (OPZELURA ) 1.5 % CREA Apply to aa's rash QD PRN.   STIOLTO RESPIMAT  2.5-2.5 MCG/ACT AERS Inhale 2 puffs into the lungs at bedtime.   temazepam  (RESTORIL ) 30 MG capsule Take 2 capsules (60 mg total) by mouth at bedtime as needed for sleep.   traZODone  (DESYREL ) 100 MG tablet 3 qhs   valACYclovir  (VALTREX ) 1000 MG tablet Take 2 tablets by mouth twice daily for 1 day as needed for herpes outbreak.   Vitamin D3 (VITAMIN D ) 25 MCG tablet Take 3,000 Units by mouth daily.     Allergies:   Albuterol , Crestor  [rosuvastatin ], Halcion [triazolam], Levaquin  [levofloxacin  in d5w], Naproxen sodium, Ozempic  (0.25 or  0.5 mg-dose) [semaglutide (0.25 or 0.5mg -dos)], Cefaclor, Latex, Prednisone , Sulfa  antibiotics, Tramadol , Zinc, Aripiprazole, and Diclofenac  sodium   Social History   Socioeconomic History   Marital status: Divorced    Spouse name: Not on file   Number of children: 1   Years of education: Not on file   Highest education level: Bachelor's degree (e.g., BA, AB, BS)  Occupational History   Occupation: Clinical biochemist Rep at [employer]    Employer: OTHER   Occupation: Disability  Tobacco Use   Smoking status: Former    Current packs/day: 0.00    Average packs/day: 1 pack/day for 25.0 years (25.0 ttl pk-yrs)    Types: Cigarettes    Start date: 44    Quit date: 2019    Years since quitting: 6.5    Passive exposure: Past   Smokeless tobacco: Never   Tobacco comments:    occasional use  Vaping Use   Vaping status: Former  Substance and Sexual Activity   Alcohol  use: No    Alcohol /week: 0.0 standard drinks of alcohol    Drug use: Yes    Types: Methylphenidate , Marijuana    Comment: Delta 8 - once per day   Sexual activity: Never  Other Topics Concern   Not on file  Social History Narrative   Not on file   Social Drivers of Health   Financial Resource Strain: High Risk (12/21/2023)   Overall Financial Resource Strain (CARDIA)    Difficulty of Paying Living Expenses: Hard  Food Insecurity: Food Insecurity Present (12/21/2023)   Hunger Vital Sign    Worried About Running Out of Food in the Last Year: Patient declined    Ran Out of Food in the Last Year: Often true  Transportation Needs: No Transportation Needs (12/21/2023)   PRAPARE - Administrator, Civil Service (Medical): No    Lack of Transportation (Non-Medical): No  Physical Activity: Inactive (12/21/2023)   Exercise Vital Sign    Days of Exercise per Week: 0 days    Minutes of Exercise per Session: 0 min  Stress: Stress Concern Present (12/21/2023)   [employer] of Occupational Health - Occupational Stress  Questionnaire    Feeling of Stress : To some extent  Social Connections: Moderately Isolated (12/21/2023)   Social Connection and Isolation Panel    Frequency of Communication with Friends and Family: Three  times a week    Frequency of Social Gatherings with Friends and Family: Never    Attends Religious Services: 1 to 4 times per year    Active Member of [employer] or Organizations: No    Attends Engineer, structural: Never    Marital Status: Divorced     Family History: The patient's family history includes Cancer in her father and mother; Diabetes in her mother; Heart disease in her mother; High Cholesterol in her father; Sleep apnea in her mother. There is no history of Kidney disease, Bladder Cancer, Prostate cancer, or Kidney cancer.  ROS:   Please see the history of present illness.     All other systems reviewed and are negative.  EKGs/Labs/Other Studies Reviewed:    The following studies were reviewed today:   EKG Interpretation Date/Time:  Monday June 12 2024 14:47:21 EDT Ventricular Rate:  88 PR Interval:  172 QRS Duration:  88 QT Interval:  366 QTC Calculation: 442 R Axis:   123  Text Interpretation: Normal sinus rhythm Possible Left atrial enlargement Right axis deviation Confirmed by Darliss Rogue ([id number]) on 06/12/2024 2:50:19 PM    Recent Labs: 04/03/2024: ALT 9; BUN 20; Creatinine, Ser 0.91; Hemoglobin 12.4; Platelets 405.0; Potassium 4.3; Sodium 141; TSH 3.99  Recent Lipid Panel    Component Value Date/Time   CHOL 115 04/03/2024 1559   CHOL 122 03/23/2023 1049   TRIG 188.0 (H) 04/03/2024 1559   HDL 35.80 (L) 04/03/2024 1559   HDL 39 (L) 03/23/2023 1049   CHOLHDL 3 04/03/2024 1559   VLDL 37.6 04/03/2024 1559   LDLCALC 41 04/03/2024 1559   LDLCALC 56 03/23/2023 1049   LDLDIRECT 53.0 08/01/2020 1254    Physical Exam:    VS:  BP 97/67   Pulse 88   Ht 5' 8 (1.727 m)   Wt 234 lb (106.1 kg)   LMP 08/23/2014   SpO2 95%   BMI 35.58 kg/m      Wt Readings from Last 3 Encounters:  06/12/24 234 lb (106.1 kg)  04/03/24 230 lb (104.3 kg)  02/03/24 234 lb (106.1 kg)     GEN:  Well nourished, well developed in no acute distress HEENT: Normal NECK: No JVD; No carotid bruits CARDIAC: RRR, no murmurs, rubs, gallops RESPIRATORY:  Clear to auscultation without rales,  ABDOMEN: Soft, non-tender, distended MUSCULOSKELETAL:  No edema; No deformity  SKIN: Warm and dry NEUROLOGIC:  Alert and oriented x 3 PSYCHIATRIC:  Normal affect   ASSESSMENT:    1. Inappropriate sinus tachycardia (HCC)   2. Mixed hyperlipidemia   3. Family history of stroke    PLAN:    In order of problems listed above:  Inappropriate sinus tach, heart rate controlled. Continue Lopressor  50 mg twice daily Mixed hyperlipidemia, intolerant to statins. Continue Repatha , fenofibrate .  Low-cholesterol diet advised. Family history of carotid stenosis, no bruit on exam, will obtain screening carotid ultrasound.  Follow-up yearly   Medication Adjustments/Labs and Tests Ordered: Current medicines are reviewed at length with the patient today.  Concerns regarding medicines are outlined above.  Orders Placed This Encounter  Procedures   US  Carotid Duplex Bilateral   EKG 12-Lead   VAS US  CAROTID     No orders of the defined types were placed in this encounter.     Patient Instructions  Medication Instructions:  Your physician recommends that you continue on your current medications as directed. Please refer to the Current Medication list given to you today.   *If  you need a refill on your cardiac medications before your next appointment, please call your pharmacy*  Lab Work: No labs ordered today  If you have labs (blood work) drawn today and your tests are completely normal, you will receive your results only by: MyChart Message (if you have MyChart) OR A paper copy in the mail If you have any lab test that is abnormal or we need to change your  treatment, we will call you to review the results.  Testing/Procedures: Carotid ultrasound   Follow-Up: At [facility], you and your health needs are our priority.  As part of our continuing mission to provide you with exceptional heart care, our providers are all part of one team.  This team includes your primary Cardiologist (physician) and Advanced Practice Providers or APPs (Physician Assistants and Nurse Practitioners) who all work together to provide you with the care you need, when you need it.  Your next appointment:   1 year(s)  Provider:   You may see Redell Cave, MD or one of the following Advanced Practice Providers on your designated Care Team:   Lonni Meager, NP Lesley Maffucci, PA-C Bernardino Bring, PA-C Cadence Greenbrier, PA-C Tylene Lunch, NP Barnie Hila, NP    We recommend signing up for the patient portal called MyChart.  Sign up information is provided on this After Visit Summary.  MyChart is used to connect with patients for Virtual Visits (Telemedicine).  Patients are able to view lab/test results, encounter notes, upcoming appointments, etc.  Non-urgent messages can be sent to your provider as well.   To learn more about what you can do with MyChart, go to [email or web address].     Signed, Redell Cave, MD  06/12/2024 3:25 PM    [facility]

## 2024-06-13 ENCOUNTER — Other Ambulatory Visit (HOSPITAL_COMMUNITY): Payer: Self-pay | Admitting: Psychiatry

## 2024-06-26 ENCOUNTER — Other Ambulatory Visit (HOSPITAL_COMMUNITY): Payer: Self-pay

## 2024-06-26 MED ORDER — HYDROXYZINE PAMOATE 50 MG PO CAPS: 100.0000 mg | ORAL_CAPSULE | Freq: Every day | ORAL | 2 refills | Status: DC

## 2024-06-28 ENCOUNTER — Ambulatory Visit: Admitting: Urology

## 2024-06-30 ENCOUNTER — Other Ambulatory Visit: Payer: Self-pay | Admitting: Internal Medicine

## 2024-06-30 ENCOUNTER — Other Ambulatory Visit: Payer: Self-pay | Admitting: Dermatology

## 2024-06-30 DIAGNOSIS — L42 Pityriasis rosea: Secondary | ICD-10-CM

## 2024-07-03 ENCOUNTER — Ambulatory Visit: Attending: Cardiology

## 2024-07-03 ENCOUNTER — Other Ambulatory Visit: Payer: Self-pay | Admitting: Cardiology

## 2024-07-03 ENCOUNTER — Ambulatory Visit: Payer: Self-pay | Admitting: Cardiology

## 2024-07-03 DIAGNOSIS — I6521 Occlusion and stenosis of right carotid artery: Secondary | ICD-10-CM

## 2024-07-03 DIAGNOSIS — Z823 Family history of stroke: Secondary | ICD-10-CM

## 2024-07-03 DIAGNOSIS — I4711 Inappropriate sinus tachycardia, so stated: Secondary | ICD-10-CM

## 2024-07-03 DIAGNOSIS — E782 Mixed hyperlipidemia: Secondary | ICD-10-CM

## 2024-07-04 ENCOUNTER — Ambulatory Visit (HOSPITAL_COMMUNITY): Admitting: Clinical

## 2024-07-04 ENCOUNTER — Encounter (HOSPITAL_COMMUNITY): Payer: Self-pay | Admitting: Clinical

## 2024-07-04 DIAGNOSIS — Z8659 Personal history of other mental and behavioral disorders: Secondary | ICD-10-CM | POA: Diagnosis not present

## 2024-07-04 DIAGNOSIS — F609 Personality disorder, unspecified: Secondary | ICD-10-CM

## 2024-07-04 DIAGNOSIS — F314 Bipolar disorder, current episode depressed, severe, without psychotic features: Secondary | ICD-10-CM | POA: Diagnosis not present

## 2024-07-04 NOTE — Progress Notes (Signed)
 THERAPIST PROGRESS NOTE  Session Time: 1:03pm-2:00pm  Session #6  Virtual Visit via Video Note  I connected with Emily Moon on 07/04/24 at  1:00 PM EDT by a video enabled telemedicine application and verified that I am speaking with the correct person using two identifiers.  Location: Patient: Home Provider:  Home office   I discussed the limitations of evaluation and management by telemedicine and the availability of in person appointments. The patient expressed understanding and agreed to proceed.   I discussed the assessment and treatment plan with the patient. The patient was provided an opportunity to ask questions and all were answered. The patient agreed with the plan and demonstrated an understanding of the instructions.   The patient was advised to call back or seek an in-person evaluation if the symptoms worsen or if the condition fails to improve as anticipated.  I provided 57 minutes of non-face-to-face time during this encounter.    Emily JINNY Crest, LCSW   Participation Level: Active  Behavioral Response: Casual Alert Euthymic and Disorganized   Type of Therapy: Individual Therapy  Treatment Goals addressed:  New treatment goals established, current goals reviewed:  Progressing LTG: Score less than 9 on the PHQ-9 and less than 5 on the GAD-7 as evidenced by intermittent administration of the questionnaires to determine progress in managing depression and anxiety.    STG: Emily Moon will practice problem solving skills 3 times per week for the next 4 weeks.  STG: Emily Moon will reduce frequency of avoidant behaviors by 50% as evidenced by self-report in therapy sessions  LTG: Recall traumatic events without becoming overwhelmed with negative emotions AEB being able to express her sadness without getting stuck in it.  LTG: Learn to recognize at least 5 warning signs that mania is imminent as well as 2 preventative and/or mitigating actions she can take  immediately.  STG: Take medicine exactly as prescribed by the doctor, report symptoms of flare-ups to him, and be open about her feelings in order to have a better response to her needs and therefore an improved outcome.  LTG: Learn emotion regulation strategies, distress tolerance skills, interpersonal effectiveness techniques, and mindfulness practices and use them in session and in life situations to improve results and satisfaction.    STG: Refrain from self-harm behaviors including self-mutilation, sabotage of relationships, substance abuse, and isolation, learn alternative behaviors, and create a safety plan on how to handle desires to do any of these things.    Not Progressing  LTG: Work on forgiveness, shame, sleep, relationship to food, or other issues as appropriate and as these present during sessions.    STG: Identify and decrease cognitive distortions contributing negatively to mood and behavior by identifying 5-7 cognitive distortions that are present; learn how to come up with replacement thoughts that are more balanced, realistic, and helpful.    LTG: Learn breathing techniques and grounding techniques at an age-appropriate and ability-appropriate level and demonstrate mastery in session then report independent use of these skills out of session.    STG: Learn about boundary types, how to implement them, and how to enforce them so that feels more empowered and content with being able to maintain more helpful, appropriate boundaries in the future for a more balanced result.    LTG: Learn about the feeling of anxiety and its many variations, the cycle of anxiety and how to interrupt that cycle.   LTG: Elimination of maladaptive behaviors and thinking patterns which interfere with resolution of trauma as evidenced  by ability to acknowledge cognitive distortions related to trauma. STG: Explore and resolve issues relating to history of abuse/neglect/trauma victimization that have contributed to  presentation of anxiety, hypervigilance, rage, and other symptoms.   New LTG: Process life events to the extent needed so that can move forward with various areas of life in a better frame of mind.   ProgressTowards Goals: Progressing  Interventions: Supportive and Other: Medication, diagnosis review, mild processing of one part of trauma, treatment planning   Summary: Emily Moon is a 63 y.o. female who presents with Bipolar disorder, history of PTSD, and Cluster B personality disorder for therapy.  She presented oriented x5 and stated she was feeling more positive.  CSW evaluated patient's medication compliance, use of coping tools, and self-care, as applicable.  She provided an update on various aspects of her life that are normally discussed in therapy, including her recent religious studies, use of Delta 8, and memory problems. Her personal religion of Christianity leads her to study the Bible and she disclosed she has been talking to the [facility] a great deal, knows he is listening and responding.  She shared several specific instances, for instance, hearing a voice ask her Are you awake? when she was actually alone.  This was the main focus of her session at her own insistence, as she stated it is not something she can talk to people about normally because they do not believe her.  She feels she is less crabby and negative in her speech, saying she asked her higher power for discernment and for help with her big mouth.  As part of her effort to be more positive, she is not watching the news any longer and she is dialing back from her son whom she feels yells at her.  She disclosed that she wants to move forward in her life and the [facility] told her to get rid of what does not move her forward.  She concluded her son does not move her forward.  CSW suggested she remain available to him and not cut him off completely for the sake of being able to have a relationship in the future,  but she was adamant she knows what she wants.  CSW reminded her that desires can vary with life changes but she lacks insight and could not absorb this suggestion. She stated she is using Delta 8 more than she would like to, but insisted it helps with pain relief and she likes the buzz.  She does not drive on it.  She acknowledged that it can affect her memory and that she is in fact very concerned about her current memory problems.  She went on to ramble for the remainder of the session about some of the things she is doing for herself (less on-line shopping, more cooking at home, trying not to curse), as well as some bizarre topics such as Don't get chipped, whatever you do, and Don't let them bury your body with a tree so the tree grows around you.  We also explored how she is thinking about distancing herself from a friend and CSW took her through some of the boundaries that could be helpful.  Finally, she disclosed that her neurologist prescribed Risperidone 0.25 mg nightly, to be increased to 0.50 mg nightly after one week for mood stabilization which she only took one night but did not repeat due to sedation.  CSW encouraged her not to take that medicine unless she discusses it with Dr.  Plovsky because his last note indicated she is already concerned with sedation on her current medicine regimen.  CSW also reminded her that it is not appropriate to have two practitioners prescribing psychotropic medication.   Suicidal/Homicidal: No without intent/plan  Therapist Response: Patient is progressing AEB engaging in scheduled therapy session.  Throughout the session, CSW gave patient the opportunity to explore thoughts and feelings associated with current life situations and past/present stressors.   CSW challenged patient gently and appropriately to consider different ways of looking at reported issues. CSW encouraged patient's expression of feelings and validated these using empathy, active listening,  open body language, and unconditional positive regard.  Additional virtual appointments are already scheduled.  Plan/Recommendations: Return again at next scheduled appointment on 9/10, refrain from using Risperidone unless she talks to Dr. 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 Patient/Guardian was advised if they have not already done so to contact the registration department to sign all necessary forms in order for us  to release information regarding their care.   Consent: Patient/Guardian gives verbal consent for treatment and assignment of benefits for services provided during this visit. Patient/Guardian expressed understanding and agreed to proceed.   Emily JINNY Crest, LCSW 07/04/2024

## 2024-07-12 ENCOUNTER — Encounter: Payer: Self-pay | Admitting: Physician Assistant

## 2024-07-12 ENCOUNTER — Encounter: Payer: Self-pay | Admitting: Oncology

## 2024-07-12 ENCOUNTER — Ambulatory Visit (INDEPENDENT_AMBULATORY_CARE_PROVIDER_SITE_OTHER): Admitting: Physician Assistant

## 2024-07-12 VITALS — BP 110/75 | HR 93 | Ht 68.0 in | Wt 234.0 lb

## 2024-07-12 DIAGNOSIS — N3281 Overactive bladder: Secondary | ICD-10-CM | POA: Diagnosis not present

## 2024-07-12 NOTE — Progress Notes (Signed)
 PTNS  Session # 25  Health & Social Factors: no change Caffeine : 1 Alcohol : 0 Daytime voids #per day: 30 Night-time voids #per night: 1 Urgency: Mild Incontinence Episodes #per day: 0 Ankle used: Right Treatment Setting: 8 Feeling/ Response: Sensory, Toe Flex Comments: Patient tolerated well.  Performed By: Beauford Browner, CCMA  Additional notes: She reports straining and frequency, is concerned for possible UTI today. She brought a sample from home in a sterile cup. We discussed that her worsening frequency could be just because she hasn't been able to do maintenance treatments monthly. UA rather bland, will send for culture. I gave her the number for pelvic PT scheduling, as I continue to think there's an element of pelvic floor dysfunction playing a role here.   Samantha Vaillancourt, PA-C   Follow Up: One Month Follow up

## 2024-07-12 NOTE — Patient Instructions (Signed)
 Pelvic floor PT scheduling: [phone]

## 2024-07-13 LAB — MICROSCOPIC EXAMINATION

## 2024-07-13 LAB — URINALYSIS, COMPLETE
Bilirubin, UA: NEGATIVE
Glucose, UA: NEGATIVE
Ketones, UA: NEGATIVE
Nitrite, UA: NEGATIVE
Protein,UA: NEGATIVE
RBC, UA: NEGATIVE
Specific Gravity, UA: 1.03 (ref 1.005–1.030)
Urobilinogen, Ur: 0.2 mg/dL (ref 0.2–1.0)
pH, UA: 6 (ref 5.0–7.5)

## 2024-07-14 ENCOUNTER — Other Ambulatory Visit: Payer: Self-pay | Admitting: Internal Medicine

## 2024-07-17 LAB — CULTURE, URINE COMPREHENSIVE

## 2024-07-18 ENCOUNTER — Ambulatory Visit: Payer: Self-pay | Admitting: Physician Assistant

## 2024-07-19 ENCOUNTER — Other Ambulatory Visit (HOSPITAL_COMMUNITY): Payer: Self-pay | Admitting: Psychiatry

## 2024-07-26 ENCOUNTER — Encounter (HOSPITAL_COMMUNITY): Payer: Self-pay | Admitting: Clinical

## 2024-07-26 ENCOUNTER — Ambulatory Visit (HOSPITAL_COMMUNITY): Admitting: Clinical

## 2024-07-26 DIAGNOSIS — Z8659 Personal history of other mental and behavioral disorders: Secondary | ICD-10-CM | POA: Diagnosis not present

## 2024-07-26 DIAGNOSIS — F315 Bipolar disorder, current episode depressed, severe, with psychotic features: Secondary | ICD-10-CM

## 2024-07-26 DIAGNOSIS — F609 Personality disorder, unspecified: Secondary | ICD-10-CM

## 2024-07-26 DIAGNOSIS — F314 Bipolar disorder, current episode depressed, severe, without psychotic features: Secondary | ICD-10-CM
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
## 2024-07-28 DIAGNOSIS — Z114 Encounter for screening for human immunodeficiency virus [HIV]: Secondary | ICD-10-CM | POA: Diagnosis not present

## 2024-07-28 DIAGNOSIS — R531 Weakness: Secondary | ICD-10-CM | POA: Diagnosis not present

## 2024-08-01 DIAGNOSIS — R531 Weakness: Secondary | ICD-10-CM | POA: Diagnosis not present

## 2024-08-01 DIAGNOSIS — R200 Anesthesia of skin: Secondary | ICD-10-CM | POA: Diagnosis not present

## 2024-08-01 DIAGNOSIS — R2689 Other abnormalities of gait and mobility: Secondary | ICD-10-CM | POA: Diagnosis not present

## 2024-08-02 ENCOUNTER — Ambulatory Visit (HOSPITAL_COMMUNITY): Admitting: Psychiatry

## 2024-08-02 ENCOUNTER — Encounter (HOSPITAL_COMMUNITY): Payer: Self-pay | Admitting: Psychiatry

## 2024-08-02 ENCOUNTER — Other Ambulatory Visit: Payer: Self-pay

## 2024-08-02 ENCOUNTER — Other Ambulatory Visit (HOSPITAL_COMMUNITY): Payer: Self-pay | Admitting: Psychiatry

## 2024-08-02 VITALS — BP 109/69 | HR 88 | Ht 68.0 in | Wt 228.0 lb

## 2024-08-02 DIAGNOSIS — F324 Major depressive disorder, single episode, in partial remission: Secondary | ICD-10-CM

## 2024-08-02 DIAGNOSIS — F310 Bipolar disorder, current episode hypomanic: Secondary | ICD-10-CM

## 2024-08-02 MED ORDER — DULOXETINE HCL 20 MG PO CPEP: 40.0000 mg | ORAL_CAPSULE | Freq: Every day | ORAL | 5 refills | Status: DC

## 2024-08-02 MED ORDER — HYDROXYZINE PAMOATE 50 MG PO CAPS: 100.0000 mg | ORAL_CAPSULE | Freq: Every day | ORAL | 2 refills | Status: AC

## 2024-08-02 MED ORDER — LAMOTRIGINE 150 MG PO TABS: ORAL_TABLET | ORAL | 5 refills | Status: AC

## 2024-08-02 MED ORDER — TRAZODONE HCL 100 MG PO TABS: ORAL_TABLET | ORAL | 4 refills | Status: DC

## 2024-08-02 MED ORDER — DESIPRAMINE HCL 25 MG PO TABS: ORAL_TABLET | ORAL | 2 refills | Status: DC

## 2024-08-02 NOTE — Progress Notes (Signed)
 The most painful.  This  Psychiatric Initial Adult Assessment   Patient Identification: Emily Moon MRN:  [id number] Date of Evaluation:  08/02/2024 Referral Source: From the community Chief Complaint: Feels exhausted        Today patient is at her baseline.  Actually she acknowledges that her mood is better.  She seems less desperate and anxious.  She herself admits that she does not feel as depressed as usual.  Today she is on a walker.  She says her arms and her legs are very weak.  She has pain in her back and her legs.  The patient takes all the medicines prescribed.  Which is very important and she is seeing a good therapist here and she has made a connection.  The patient has a neurologist Dr. Lane who prescribed Risperdal which she only took 1 dose.  I believe the patient is stable and actually her mood seems to be better.   Procedure(s) Performed: * No surgery found *  Anesthes  Patient location:   Post pain:   Post assessment:   Last Vitals:  Vitals:   08/02/24 1409  BP: 109/69  Pulse: 88     Post vital signs:   Level of consciousness:   Complications: . Associated Signs/Symptoms: Depression Symptoms:  hopelessness, (Hypo) Manic Symptoms:   Anxiety Symptoms:   Psychotic Symptoms:   PTSD Symptoms:   Past Psychiatric History: The patient was seen in a psychiatric office and takes multiple medications she's not been recently hospitalized.  Previous Psychotropic Medications: Yes   Substance Abuse History in the last 12 months:  No.  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   History of kidney stones    Hx of sepsis    Hypercholesterolemia    IDA (iron  deficiency anemia)    Multiple sclerosis (HCC)    weakness   Osteoporosis    osteoarthritis   Prediabetes 07/22/2018   Psoriasis    Seasonal allergies    Sleep apnea 2012   sleep study / slight, no interventions   Vitamin B 12 deficiency     Past Surgical History:  Procedure Laterality Date   CATARACT EXTRACTION W/PHACO Left 05/21/2015   Procedure: CATARACT EXTRACTION PHACO AND INTRAOCULAR LENS PLACEMENT (IOC);  Surgeon: Elsie Carmine, MD;  Location: [facility];  Service: Ophthalmology;  Laterality: Left;  US  00:35 AP% 22.9 CDE 8.11 fluid pack lot #[id number]   CATARACT EXTRACTION W/PHACO Right 06/04/2015   Procedure: CATARACT EXTRACTION PHACO AND INTRAOCULAR LENS PLACEMENT (IOC);  Surgeon: Elsie Carmine, MD;  Location: [facility];  Service: Ophthalmology;  Laterality: Right;  US :00:48 AP%: 10.5 CDE:5.08 Fluid lot #[id number]   CYSTOSCOPY/URETEROSCOPY/HOLMIUM LASER/STENT PLACEMENT Bilateral 09/22/2016   Procedure: CYSTOSCOPY/URETEROSCOPY/HOLMIUM LASER/STENT PLACEMENT;  Surgeon: Rosina Riis, MD;  Location: [facility];  Service: Urology;  Laterality: Bilateral;   EYE SURGERY  2015   tissue biopsy   FOOT SURGERY  2015   JOINT REPLACEMENT Left 2013   hip replacement   LITHOTRIPSY  PTOSIS REPAIR Bilateral 02/18/2016   Procedure: BILATERAL PTOSIS REPAIR UPPER EYELIDS;  Surgeon: Greig CHRISTELLA Gay, MD;  Location: [facility];  Service: Ophthalmology;  Laterality: Bilateral;  LEAVE PT EARLY AM   thumb surgery Right    TONSILLECTOMY  1973   TOTAL HIP ARTHROPLASTY Right 07/02/2021   Procedure: TOTAL HIP ARTHROPLASTY ANTERIOR APPROACH;  Surgeon: Melodi Lerner, MD;  Location: [facility];  Service: Orthopedics;  Laterality: Right;    Family Psychiatric History:   Family History:  Family History  Problem Relation Age of Onset    Cancer Mother    Heart disease Mother    Diabetes Mother    Sleep apnea Mother    Cancer Father        Abdomen with mastasis   High Cholesterol Father    Kidney disease Neg Hx    Bladder Cancer Neg Hx    Prostate cancer Neg Hx    Kidney cancer Neg Hx     Social History:   Social History   Socioeconomic History   Marital status: Divorced    Spouse name: Not on file   Number of children: 1   Years of education: Not on file   Highest education level: Bachelor's degree (e.g., BA, AB, BS)  Occupational History   Occupation: Clinical biochemist Rep at [employer]    Employer: OTHER   Occupation: Disability  Tobacco Use   Smoking status: Former    Current packs/day: 0.00    Average packs/day: 1 pack/day for 25.0 years (25.0 ttl pk-yrs)    Types: Cigarettes    Start date: 41    Quit date: 2019    Years since quitting: 6.7    Passive exposure: Past   Smokeless tobacco: Never   Tobacco comments:    occasional use  Vaping Use   Vaping status: Former  Substance and Sexual Activity   Alcohol  use: No    Alcohol /week: 0.0 standard drinks of alcohol    Drug use: Yes    Types: Methylphenidate , Marijuana    Comment: Delta 8 - once per day   Sexual activity: Never  Other Topics Concern   Not on file  Social History Narrative   Not on file   Social Drivers of Health   Financial Resource Strain: High Risk (12/21/2023)   Overall Financial Resource Strain (CARDIA)    Difficulty of Paying Living Expenses: Hard  Food Insecurity: No Food Insecurity (07/28/2024)   Received from [facility]   Hunger Vital Sign    Within the past 12 months, you worried that your food would run out before you got the money to buy more.: Never true    Within the past 12 months, the food you bought just didn't last and you didn't have money to get more.: Never true  Transportation Needs: No Transportation Needs (07/28/2024)   [facility]   PRAPARE - Transportation    Lack of  Transportation (Medical): No    Lack of Transportation (Non-Medical): No  Physical Activity: Inactive (12/21/2023)   Exercise Vital Sign    Days of Exercise per Week: 0 days    Minutes of Exercise per Session: 0 min  Stress: Stress Concern Present (12/21/2023)   [employer] of Occupational Health - Occupational Stress Questionnaire    Feeling of Stress : To some extent  Social Connections: Moderately Isolated (12/21/2023)   Social Connection and Isolation Panel    Frequency of Communication with Friends and Family: Three times a week  Frequency of Social Gatherings with Friends and Family: Never    Attends Religious Services: 1 to 4 times per year    Active Member of [employer] or Organizations: No    Attends Banker Meetings: Never    Marital Status: Divorced    Additional Social History:   Allergies:   Allergies  Allergen Reactions   Albuterol  Shortness Of Breath and Other (See Comments)    Makes pt feel jittery/ tacycardic   Crestor  [Rosuvastatin ] Other (See Comments)    Joint pain, muscle pain, and hair loss   Halcion [Triazolam] Other (See Comments)    Dizziness,headaches,bladder problems   Levaquin  [Levofloxacin  In D5w] Diarrhea and Itching    Shoulder pain   Naproxen Sodium     Patient tolerates in small doses. Her reaction is swelling of the lower extremities.    Ozempic  (0.25 Or 0.5 Mg-Dose) [Semaglutide (0.25 Or 0.5mg -Dos)] Itching   Cefaclor Other (See Comments)    Doesn't remember---unsure if actually allergic    Latex Itching   Prednisone      Elevates blood sugars   Sulfa  Antibiotics Itching    Unsure of reaction possibly itching   Tramadol  Itching and Nausea And Vomiting   Zinc Other (See Comments)    constipation  constipation    Aripiprazole Other (See Comments)    Muscle tension/cramping   Diclofenac  Sodium Rash    made very sick    Metabolic Disorder Labs: Lab Results  Component Value Date   HGBA1C 6.1 04/03/2024   MPG 142.72  07/02/2021   No results found for: PROLACTIN Lab Results  Component Value Date   CHOL 115 04/03/2024   TRIG 188.0 (H) 04/03/2024   HDL 35.80 (L) 04/03/2024   CHOLHDL 3 04/03/2024   VLDL 37.6 04/03/2024   LDLCALC 41 04/03/2024   LDLCALC 56 03/23/2023     Current Medications: Current Outpatient Medications  Medication Sig Dispense Refill   aspirin  EC 81 MG tablet Take 81 mg by mouth daily. Swallow whole.     Azelastine  HCl 137 MCG/SPRAY SOLN PLACE 2 SPRAYS INTO BOTH NOSTRILS 2 (TWO) TIMES DAILY 30 mL 11   B Complex-Biotin -FA (BIG 100, BIOTIN , PO) Take 300 mg by mouth at bedtime.     bisacodyl  (DULCOLAX) 5 MG EC tablet Take 5 mg by mouth daily as needed for moderate constipation.     cetirizine (ZYRTEC) 10 MG tablet Take 10 mg by mouth daily.     clindamycin  (CLEOCIN  T) 1 % external solution Apply to scalp once or twice a day prn bumps 30 mL 2   Continuous Glucose Sensor (FREESTYLE LIBRE 3 SENSOR) MISC PLACE 1 SENSOR ON THE SKIN EVERY 14 DAYS. USE TO CHECK GLUCOSE CONTINUOUSLY 6 each 12   cyanocobalamin  (VITAMIN B12) 1000 MCG/ML injection INJECT 1 ML (1,000 MCG TOTAL) INTO THE MUSCLE EVERY 30 (THIRTY) DAYS. FOR B12 VITAMIN 3 mL 3   diphenhydrAMINE  (BENADRYL ) 2 % cream Apply topically 3 (three) times daily as needed for itching.     Dulaglutide  (TRULICITY ) 3 MG/0.5ML SOAJ Inject 3 mL sq once weekly. 6 mL 3   estradiol  (ESTRACE ) 0.1 MG/GM vaginal cream Estrogen Cream Instruction Discard applicator Apply pea sized amount to tip of finger to urethra before bed. Wash hands well after application. Use Monday, Wednesday and Friday 42.5 g 11   Evolocumab  (REPATHA  SURECLICK) 140 MG/ML SOAJ Inject 140 mg into the skin every 14 (fourteen) days. PLEASE CALL OFFICE TO SCHEDULE APPOINTMENT PRIOR TO NEXT REFILL 2 mL 12   fenofibrate  (  TRICOR ) 145 MG tablet Take 1 tablet (145 mg total) by mouth daily. For cholesterol. Please call [phone] for an appointment. 90 tablet 3   ibuprofen  (ADVIL ) 200 MG  tablet Take 800 mg by mouth 2 (two) times daily.     ketoconazole  (NIZORAL ) 2 % shampoo Apply 1 Application topically as directed. Wash from waist up 3 times a week for 2 months, then decrease to 1 time a month, let sit 5 minutes and rinse off 120 mL 6   levalbuterol  (XOPENEX  HFA) 45 MCG/ACT inhaler Inhale 2 puffs into the lungs every 4 (four) hours as needed for wheezing.     Lysine  500 MG TABS Take 1,000 mg by mouth daily.     metFORMIN  (GLUCOPHAGE ) 500 MG tablet TAKE 2 TABS BY MOUTH EVERY MORNING AND 1 TAB IN THE EVENING 270 tablet 1   methocarbamol  (ROBAXIN ) 500 MG tablet TAKE 1-2 TABLETS (500-1,000 MG TOTAL) BY MOUTH AT BEDTIME AS NEEDED FOR MUSCLE SPASMS. 180 tablet 0   metoprolol  tartrate (LOPRESSOR ) 50 MG tablet TAKE 1 TABLET BY MOUTH TWICE A DAY 180 tablet 3   montelukast  (SINGULAIR ) 10 MG tablet TAKE 1 TABLET BY MOUTH EVERYDAY AT BEDTIME 90 tablet 0   mupirocin  ointment (BACTROBAN ) 2 % Apply 1 Application topically daily. Qd to wound on finger 22 g 1   neomycin -bacitracin-polymyxin (NEOSPORIN) [phone] ointment Apply 1 application topically 4 (four) times daily as needed (for cut/scrapes.).     NEOMYCIN -POLYMYXIN-HYDROCORTISONE (CORTISPORIN) 1 % SOLN OTIC solution Apply 1-2 drops to toe BID after soaking 10 mL 1   pantoprazole  (PROTONIX ) 40 MG tablet Take 1 tablet (40 mg total) by mouth 2 (two) times daily before a meal. For heartburn 180 tablet 3   polyvinyl alcohol  (LIQUIFILM TEARS) 1.4 % ophthalmic solution Place 2 drops into both eyes at bedtime.     Roflumilast  (ZORYVE ) 0.3 % CREA Apply 1 application  topically daily. qd to aa psoriasis on legs, arms 60 g 4   Ruxolitinib Phosphate  (OPZELURA ) 1.5 % CREA Apply to aa's rash QD PRN. 60 g 3   STIOLTO RESPIMAT  2.5-2.5 MCG/ACT AERS Inhale 2 puffs into the lungs at bedtime.     temazepam  (RESTORIL ) 30 MG capsule Take 2 capsules (60 mg total) by mouth at bedtime as needed for sleep. 60 capsule 4   valACYclovir  (VALTREX ) 1000 MG tablet Take 2  tablets by mouth twice daily for 1 day as needed for herpes outbreak. 12 tablet 0   Vitamin D3 (VITAMIN D ) 25 MCG tablet Take 3,000 Units by mouth daily.     B-D 3CC LUER-LOK SYR 25GX1 25G X 1 3 ML MISC 2 EACH BY DOES NOT APPLY ROUTE EVERY 30 (THIRTY) DAYS. 2 each 2   desipramine  (NORPRAMIN ) 25 MG tablet 2  qam 180 tablet 2   DULoxetine  (CYMBALTA ) 20 MG capsule Take 2 capsules (40 mg total) by mouth daily. 60 capsule 5   hydrOXYzine  (VISTARIL ) 50 MG capsule Take 2 capsules (100 mg total) by mouth at bedtime. TAKE 1 CAPSLE BY MOUTH AT NOON, 1 AT 6 PM, AND 1 AS NEEDED 180 capsule 2   lamoTRIgine  (LAMICTAL ) 150 MG tablet Take 2 tablets (300 mg total) by mouth daily at bedtime. 180 tablet 5   traZODone  (DESYREL ) 100 MG tablet 3 qhs 90 tablet 4   No current facility-administered medications for this visit.    Neurologic: Headache: No Seizure: No Paresthesias:No  Musculoskeletal: Strength & Muscle Tone: within normal limits Gait & Station: normal Patient leans: N/A  Psychiatric Specialty Exam: ROS  Blood pressure 109/69, pulse 88, height 5' 8 (1.727 m), weight 228 lb (103.4 kg), last menstrual period 08/23/2014.Body mass index is 34.67 kg/m.  General Appearance: Casual  Eye Contact:  Good  Speech:  Clear and Coherent  Volume:  Normal  Mood:  Negative  Affect:  Appropriate  Thought Process:  Goal Directed  Orientation:  NA  Thought Content:  Logical  Suicidal Thoughts:  No  Homicidal Thoughts:  No  Memory:  Negative  Judgement:  Good  Insight:  Good  Psychomotor Activity:  Normal  Concentration:    Recall:    Fund of Knowledge:Good  Language: Good  Akathisia:  No  Handed:  Right  AIMS (if indicated):    Assets:  Desire for Improvement  ADL's:  Intact  Cognition: WNL  Sleep:      9/17/20252:35 PM   This patient's diagnosis is major depression.  She takes Cymbalta  40 mg and desipramine  50 mg.  The patient also takes Lamictal  at 300 mg.  I think this is all content  and consistent with a clinical depression.  The patient also has insomnia.  She takes 60 mg of Restoril  300 mg and trazodone  as well as CPAP machine.  She will continue in one-to-one therapy.  She is somewhat less complaints today compared to the past.  She will return to see me in 3 months.  She is doing better.

## 2024-08-09 ENCOUNTER — Encounter (HOSPITAL_COMMUNITY): Payer: Self-pay | Admitting: Clinical

## 2024-08-09 ENCOUNTER — Ambulatory Visit (INDEPENDENT_AMBULATORY_CARE_PROVIDER_SITE_OTHER): Admitting: Clinical

## 2024-08-09 DIAGNOSIS — Z8659 Personal history of other mental and behavioral disorders: Secondary | ICD-10-CM

## 2024-08-09 DIAGNOSIS — F609 Personality disorder, unspecified: Secondary | ICD-10-CM

## 2024-08-09 DIAGNOSIS — F310 Bipolar disorder, current episode hypomanic: Secondary | ICD-10-CM
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
## 2024-08-14 ENCOUNTER — Ambulatory Visit: Payer: Self-pay

## 2024-08-14 ENCOUNTER — Ambulatory Visit: Admitting: Physician Assistant

## 2024-08-14 ENCOUNTER — Telehealth: Payer: Self-pay | Admitting: Internal Medicine

## 2024-08-14 ENCOUNTER — Telehealth: Payer: Self-pay | Admitting: Physician Assistant

## 2024-08-14 NOTE — Telephone Encounter (Signed)
 Copied from CRM [id number]. Topic: Clinical - Prescription Issue >> Aug 14, 2024  3:13 PM [place] J wrote: Reason for CRM: The patient's freestyle libre 3 monitor is discontinued and no longer being sold at [facility]. She is now needing the freestyle libre 3 plus monitor sent in if possible.

## 2024-08-14 NOTE — Telephone Encounter (Signed)
 Copied from CRM [id number]. Topic: Clinical - Medication Question >> Aug 14, 2024  3:05 PM [place] J wrote: Reason for CRM: Patient is wondering if she can re-file her Evolocumab  (REPATHA  SURECLICK) 140 MG/ML SOAJ & Dulaglutide  (TRULICITY ) 3 MG/0.5ML SOAJ with the pharmacist.  Please call patient at: [phone]

## 2024-08-14 NOTE — Telephone Encounter (Signed)
 Patient called and rescheduled her PTNS #26 appointment for today, due to her feeling nauseous and not well. However, patient would like to know if she may have her kidney's checked when coming in. Patient is stating that her urine is more frequent and super foamy. Please advise.

## 2024-08-14 NOTE — Telephone Encounter (Signed)
 Reached out to patient in reference to the Repatha . The repatha  was prescibed by cardiology(Ryan Dunn) referred patient to cardiology

## 2024-08-14 NOTE — Telephone Encounter (Signed)
 Fine to reschedule PTNS maintenance at her convenience, please plan for BMP and UA that day.

## 2024-08-15 ENCOUNTER — Other Ambulatory Visit: Payer: Self-pay

## 2024-08-15 ENCOUNTER — Other Ambulatory Visit: Payer: Self-pay | Admitting: Emergency Medicine

## 2024-08-15 MED ORDER — FREESTYLE LIBRE 3 READER DEVI: 1.0000 | Freq: Once | 0 refills | Status: DC

## 2024-08-15 NOTE — Addendum Note (Signed)
 Addended by: KALLIE CLOTILDA SQUIBB on: 08/15/2024 10:05 AM   Modules accepted: Orders

## 2024-08-15 NOTE — Telephone Encounter (Signed)
 Unable to send from this message. Will

## 2024-08-15 NOTE — Telephone Encounter (Signed)
 Phone call attempted, voicemail left for patient to return call to clinic to r/s PTNS.

## 2024-08-16 ENCOUNTER — Ambulatory Visit: Admitting: Podiatry

## 2024-08-16 ENCOUNTER — Telehealth: Payer: Self-pay | Admitting: Pharmacy Technician

## 2024-08-16 NOTE — Telephone Encounter (Signed)
 Repatha - Patient currently has an active grant for Hypercholesterolemia - Medicare Access and is not due to re-enroll until 10/04/2024.

## 2024-08-22 ENCOUNTER — Ambulatory Visit: Admitting: Physician Assistant

## 2024-08-22 VITALS — BP 97/66 | HR 92

## 2024-08-22 DIAGNOSIS — N3281 Overactive bladder: Secondary | ICD-10-CM

## 2024-08-22 DIAGNOSIS — R82998 Other abnormal findings in urine: Secondary | ICD-10-CM

## 2024-08-22 NOTE — Progress Notes (Signed)
 PTNS  Session # 26  Health & Social Factors: She feels her MS is worsening, with progressive weakness in her upper and lower extremities.  She recently saw her neurologists.  She is very concerned today about her kidney function, having previously been told she had CKD.  She describes chronic foamy urine, skin sores, and nausea. Caffeine : 1 Alcohol : 0 Daytime voids #per day: 30 Night-time voids #per night: 1 Urgency: Mild-strong Incontinence Episodes #per day: 0-1 Ankle used: Right Treatment Setting: 11 Feeling/ Response: Sensory Comments: Patient tolerated well.  I reassured her that she has had no recent proteinuria to suggest advanced renal disease or to explain her foamy urine.  BMP obtained today to assess her renal function and I will contact her with results.  Performed By: Annelise Mccoy, PA-C   Follow Up: 1 month

## 2024-08-23 ENCOUNTER — Other Ambulatory Visit: Payer: Self-pay | Admitting: Ophthalmology

## 2024-08-23 ENCOUNTER — Ambulatory Visit: Payer: Self-pay | Admitting: Physician Assistant

## 2024-08-23 DIAGNOSIS — H5711 Ocular pain, right eye: Secondary | ICD-10-CM | POA: Diagnosis not present

## 2024-08-23 DIAGNOSIS — H5713 Ocular pain, bilateral: Secondary | ICD-10-CM

## 2024-08-23 DIAGNOSIS — R519 Headache, unspecified: Secondary | ICD-10-CM | POA: Diagnosis not present

## 2024-08-23 DIAGNOSIS — Z961 Presence of intraocular lens: Secondary | ICD-10-CM | POA: Diagnosis not present

## 2024-08-23 LAB — BASIC METABOLIC PANEL WITH GFR
BUN/Creatinine Ratio: 16 (ref 12–28)
BUN: 14 mg/dL (ref 8–27)
CO2: 26 mmol/L (ref 20–29)
Calcium: 9.2 mg/dL (ref 8.7–10.3)
Chloride: 101 mmol/L (ref 96–106)
Creatinine, Ser: 0.87 mg/dL (ref 0.57–1.00)
Glucose: 105 mg/dL — ABNORMAL HIGH (ref 70–99)
Potassium: 4.4 mmol/L (ref 3.5–5.2)
Sodium: 140 mmol/L (ref 134–144)
eGFR: 75 mL/min/1.73 (ref >59)

## 2024-08-23 LAB — OPHTHALMOLOGY REPORT-SCANNED

## 2024-08-23 NOTE — Progress Notes (Signed)
 Emily Moon                                          MRN: [id number]   08/23/2024   The [facility] Quality Team Specialist reviewed this patient medical record for the purposes of chart review for care gap closure. The following were reviewed: chart review for care gap closure-kidney health evaluation for diabetes:eGFR  and uACR.    VBCI Quality Team

## 2024-08-25 ENCOUNTER — Ambulatory Visit

## 2024-08-28 ENCOUNTER — Ambulatory Visit
Admission: RE | Admit: 2024-08-28 | Discharge: 2024-08-28 | Disposition: A | Discharge status: Home / Self Care | Source: Ambulatory Visit | Attending: Ophthalmology | Admitting: Ophthalmology

## 2024-08-28 DIAGNOSIS — R519 Headache, unspecified: Secondary | ICD-10-CM | POA: Diagnosis not present

## 2024-08-28 DIAGNOSIS — J342 Deviated nasal septum: Secondary | ICD-10-CM | POA: Diagnosis not present

## 2024-08-28 DIAGNOSIS — H5713 Ocular pain, bilateral: Secondary | ICD-10-CM | POA: Insufficient documentation

## 2024-08-29 ENCOUNTER — Encounter (HOSPITAL_COMMUNITY): Payer: Self-pay | Admitting: Clinical

## 2024-08-29 ENCOUNTER — Ambulatory Visit (HOSPITAL_COMMUNITY): Admitting: Clinical

## 2024-08-29 DIAGNOSIS — F6089 Other specific personality disorders: Secondary | ICD-10-CM

## 2024-08-29 DIAGNOSIS — Z8659 Personal history of other mental and behavioral disorders: Secondary | ICD-10-CM

## 2024-08-29 DIAGNOSIS — F315 Bipolar disorder, current episode depressed, severe, with psychotic features: Secondary | ICD-10-CM

## 2024-08-29 DIAGNOSIS — F609 Personality disorder, unspecified: Secondary | ICD-10-CM
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
## 2024-09-02 ENCOUNTER — Other Ambulatory Visit: Payer: Self-pay | Admitting: Physician Assistant

## 2024-09-04 ENCOUNTER — Ambulatory Visit: Admitting: Podiatry

## 2024-09-04 DIAGNOSIS — E1142 Type 2 diabetes mellitus with diabetic polyneuropathy: Secondary | ICD-10-CM | POA: Diagnosis not present

## 2024-09-04 DIAGNOSIS — M205X1 Other deformities of toe(s) (acquired), right foot: Secondary | ICD-10-CM

## 2024-09-04 NOTE — Progress Notes (Signed)
 She presents today for her 21-month follow-up of her diabetes check.  She denies fever chills nausea vomit states that she is doing just fine for toe right foot where the flexor tenotomy was performed is still a little bit tender right here she points to the DIPJ.  Objective: Vital signs are stable alert and oriented x 3.  She has diminished sensorium per [facility] monofilament.  Pulses remain palpable capillary fill time is immediate.  She has some tenderness on end range of motion of the DIPJ fourth digit right foot which does demonstrate Heberden's nodes as well as tenderness on palpation and range of motion.  Assessment: Osteoarthritis DIPJ fourth digit right foot diabetes mellitus diabetic peripheral neuropathy bilateral foot.  Plan: Provided diabetic education discussed in detail diabetic neuropathy and will follow-up with her in 6 months.

## 2024-09-06 ENCOUNTER — Ambulatory Visit: Admitting: Dermatology

## 2024-09-06 ENCOUNTER — Other Ambulatory Visit: Payer: Self-pay | Admitting: Dermatology

## 2024-09-06 ENCOUNTER — Encounter: Payer: Self-pay | Admitting: Dermatology

## 2024-09-06 ENCOUNTER — Telehealth: Payer: Self-pay

## 2024-09-06 DIAGNOSIS — L409 Psoriasis, unspecified: Secondary | ICD-10-CM | POA: Diagnosis not present

## 2024-09-06 DIAGNOSIS — Z79899 Other long term (current) drug therapy: Secondary | ICD-10-CM

## 2024-09-06 DIAGNOSIS — L2089 Other atopic dermatitis: Secondary | ICD-10-CM | POA: Diagnosis not present

## 2024-09-06 DIAGNOSIS — B372 Candidiasis of skin and nail: Secondary | ICD-10-CM | POA: Diagnosis not present

## 2024-09-06 DIAGNOSIS — Z7189 Other specified counseling: Secondary | ICD-10-CM

## 2024-09-06 DIAGNOSIS — L299 Pruritus, unspecified: Secondary | ICD-10-CM

## 2024-09-06 DIAGNOSIS — L304 Erythema intertrigo: Secondary | ICD-10-CM | POA: Diagnosis not present

## 2024-09-06 DIAGNOSIS — D1801 Hemangioma of skin and subcutaneous tissue: Secondary | ICD-10-CM

## 2024-09-06 DIAGNOSIS — L821 Other seborrheic keratosis: Secondary | ICD-10-CM

## 2024-09-06 DIAGNOSIS — L42 Pityriasis rosea: Secondary | ICD-10-CM

## 2024-09-06 DIAGNOSIS — L0100 Impetigo, unspecified: Secondary | ICD-10-CM

## 2024-09-06 DIAGNOSIS — L509 Urticaria, unspecified: Secondary | ICD-10-CM | POA: Diagnosis not present

## 2024-09-06 MED ORDER — CLOBETASOL PROPIONATE 0.05 % EX CREA: 1.0000 | TOPICAL_CREAM | Freq: Two times a day (BID) | CUTANEOUS | 1 refills | Status: AC

## 2024-09-06 MED ORDER — MONTELUKAST SODIUM 10 MG PO TABS: 10.0000 mg | ORAL_TABLET | Freq: Every day | ORAL | 0 refills | Status: DC

## 2024-09-06 MED ORDER — MUPIROCIN 2 % EX OINT: 1.0000 | TOPICAL_OINTMENT | Freq: Every day | CUTANEOUS | 1 refills | Status: AC

## 2024-09-06 NOTE — Progress Notes (Signed)
 Follow-Up Visit   Subjective  Emily Moon is a 63 y.o. female who presents for the following: scaly patches on the neck and fingers - has been using Zoryve  and Opzelura , but patches haven't improved. Pt c/o all over itching and issues with yeast under the breast and in the groin.  The patient has spots, moles and lesions to be evaluated, some may be new or changing and the patient may have concern these could be cancer.  The following portions of the chart were reviewed this encounter and updated as appropriate: medications, allergies, medical history  Review of Systems:  No other skin or systemic complaints except as noted in HPI or Assessment and Plan.  Objective  Well appearing patient in no apparent distress; mood and affect are within normal limits.  A focused examination was performed of the following areas:  Relevant exam findings are noted in the Assessment and Plan.    Assessment & Plan   PSORIASIS Exam: Well-demarcated erythematous papules/plaques with silvery scale, guttate pink scaly papules of the elbows. 6% BSA. Chronic condition with duration or expected duration over one year. Currently well-controlled. Psoriasis is a chronic non-curable, but treatable genetic/hereditary disease that may have other systemic features affecting other organ systems such as joints (Psoriatic Arthritis). It is associated with an increased risk of inflammatory bowel disease, heart disease, non-alcoholic fatty liver disease, and depression.  Treatments include light and laser treatments; topical medications; and systemic medications including oral and injectables. 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 Condition is bothersome/symptomatic  for patient. Currently flared. Atopic dermatitis (eczema) is a chronic, relapsing, pruritic condition that can significantly affect quality of life. It is often associated with allergic rhinitis and/or asthma and can require treatment with topical medications, phototherapy, or in severe cases biologic injectable medication (Dupixent; Adbry) or Oral JAK inhibitors.  Treatment Plan: Start Mupirocin  2% ointment to aa BID x 2 weeks to finger rash for impetigo.  After two weeks if rash still present start clobetasol  0.05% cream to finger rash.to aa BID x 2 weeks then drop to 5d/wk as needed. 15g 1RF  May start Clobetasol  to neck rash now.  Topical steroids (such as triamcinolone , fluocinolone, fluocinonide , mometasone , clobetasol , halobetasol, betamethasone, hydrocortisone) can cause thinning and lightening of the skin if they are used for too long in the same area. Your physician has selected the right strength medicine for your problem and area affected on the body. Please use your medication only as directed by your physician to prevent side effects.   Recommend gentle skin care.  Impetiginous area cleaned with Puracyn spray and area covered with Mupirocin  2% ointment and wrapped in a bandage.  HEMANGIOMA Exam: red papule(s) Discussed benign nature. Recommend observation. Call for changes.  SEBORRHEIC KERATOSIS - Stuck-on, waxy, tan-brown papules and/or plaques  - Benign-appearing - Discussed benign etiology and prognosis. - Observe - Call for any changes - Apply diclofenac  (voltaren ) gel twice a day to spots.   URTICARIA with pruritus Exam: clear today Chronic and persistent condition with duration or expected duration over one year. Condition is symptomatic/ bothersome to patient. Not currently at goal. Urticaria or hives is a pink to red patchy whelp- like rash of the skin that typically itches and it is the result of histamine release  in the skin.   Hives may have multiple causes  including stress, medications, infections, and systemic illness.  Sometimes there is a family history of chronic urticaria.   Physical urticarias may be caused by pressure (dermatographism), heat, sun, cold, vibration.  Insect bites can cause papular urticaria. It is often difficult to find the cause of generalized hives.  Statistically, 70% of the time a cause of generalized hives is not found.  Sometimes hives can spontaneously resolve. Other times hives can persist and when it does, and no cause is found, and it has been at least 6 weeks since started, it is called chronic idiopathic urticaria. Antihistamines are the mainstay for treatment.  In severe cases Xolair injections may be used.  Treatment Plan: Continue Singulair  10 mg po QD. Discussed potential side effects. Pt notes that she takes many medications that can cause depression and suicidal ideation, and she hasn't noticed any new or worsening symptoms. Pt to report on condition in one month.   Consider Dupixent, Rhapsido, and Nemluvio in the future.  INTERTRIGO With Candida yeast Exam: Erythematous macerated patches in body folds Chronic and persistent condition with duration or expected duration over one year. Condition is bothersome/symptomatic for patient. Currently flared. Intertrigo is a chronic recurrent rash that occurs in skin fold areas that may be associated with friction; heat; moisture; yeast; fungus; and bacteria.  It is exacerbated by increased movement / activity; sweating; and higher atmospheric temperature.  Use of an absorbant powder such as Zeasorb AF powder or other OTC antifungal powder to the area daily can prevent rash recurrence. Other options to help keep the area dry include blow drying the area after bathing or using antiperspirant products such as Duradry sweat minimizing gel. Treatment Plan: Plan to start Fluconazole  150 mg po QW x 4 weeks pending cardiologist approval. Continue Lotrimin powder OTC  daily.    Return in about 6 months (around 03/07/2025) for rash follow up.  LILLETTE Rosina Mayans, CMA, am acting as scribe for Alm Rhyme, MD .  Documentation: I have reviewed the above documentation for accuracy and completeness, and I agree with the above.  Alm Rhyme, MD

## 2024-09-06 NOTE — Patient Instructions (Addendum)
 Gentle Skin Care Guide  1. Bathe no more than once a day.  2. Avoid bathing in hot water  3. Use a mild soap like Dove, Vanicream, Cetaphil, CeraVe. Can use Lever 2000 or Cetaphil antibacterial soap  4. Use soap only where you need it. On most days, use it under your arms, between your legs, and on your feet. Let the water rinse other areas unless visibly dirty.  5. When you get out of the bath/shower, use a towel to gently blot your skin dry, don't rub it.  6. While your skin is still a little damp, apply a moisturizing cream such as Vanicream, CeraVe, Cetaphil, Eucerin, Sarna lotion or plain Vaseline Jelly. For hands apply Neutrogena [place] Hand Cream or Excipial Hand Cream.  7. Reapply moisturizer any time you start to itch or feel dry.  8. Sometimes using free and clear laundry detergents can be helpful. Fabric softener sheets should be avoided. Downy Free & Gentle liquid, or any liquid fabric softener that is free of dyes and perfumes, it acceptable to use  9. If your doctor has given you prescription creams you may apply moisturizers over them      Due to recent changes in healthcare laws, you may see results of your pathology and/or laboratory studies on MyChart before the doctors have had a chance to review them. We understand that in some cases there may be results that are confusing or concerning to you. Please understand that not all results are received at the same time and often the doctors may need to interpret multiple results in order to provide you with the best plan of care or course of treatment. Therefore, we ask that you please give us  2 business days to thoroughly review all your results before contacting the office for clarification. Should we see a critical lab result, you will be contacted sooner.   If You Need Anything After Your Visit  If you have any questions or concerns for your doctor, please call our main line at [phone] and press option 4 to reach  your doctor's medical assistant. If no one answers, please leave a voicemail as directed and we will return your call as soon as possible. Messages left after 4 pm will be answered the following business day.   You may also send us  a message via MyChart. We typically respond to MyChart messages within 1-2 business days.  For prescription refills, please ask your pharmacy to contact our office. Our fax number is [phone].  If you have an urgent issue when the clinic is closed that cannot wait until the next business day, you can page your doctor at the number below.    Please note that while we do our best to be available for urgent issues outside of office hours, we are not available 24/7.   If you have an urgent issue and are unable to reach us , you may choose to seek medical care at your doctor's office, retail clinic, urgent care center, or emergency room.  If you have a medical emergency, please immediately call 911 or go to the emergency department.  Pager Numbers  - Dr. Hester: [phone]  - Dr. Jackquline: [phone]  - Dr. Claudene: [phone]   - Dr. Raymund: [phone]  In the event of inclement weather, please call our main line at [phone] for an update on the status of any delays or closures.  Dermatology Medication Tips: Please keep the boxes that topical medications come in in order to help  keep track of the instructions about where and how to use these. Pharmacies typically print the medication instructions only on the boxes and not directly on the medication tubes.   If your medication is too expensive, please contact our office at [phone] option 4 or send us  a message through MyChart.   We are unable to tell what your co-pay for medications will be in advance as this is different depending on your insurance coverage. However, we may be able to find a substitute medication at lower cost or fill out paperwork to get insurance to cover a needed medication.    If a prior authorization is required to get your medication covered by your insurance company, please allow us  1-2 business days to complete this process.  Drug prices often vary depending on where the prescription is filled and some pharmacies may offer cheaper prices.  The website www.goodrx.com contains coupons for medications through different pharmacies. The prices here do not account for what the cost may be with help from insurance (it may be cheaper with your insurance), but the website can give you the price if you did not use any insurance.  - You can print the associated coupon and take it with your prescription to the pharmacy.  - You may also stop by our office during regular business hours and pick up a GoodRx coupon card.  - If you need your prescription sent electronically to a different pharmacy, notify our office through [facility] or by phone at [phone] option 4.     Si Usted Necesita Algo Despus de Su Visita  Tambin puede enviarnos un mensaje a travs de Clinical cytogeneticist. Por lo general respondemos a los mensajes de MyChart en el transcurso de 1 a 2 das hbiles.  Para renovar recetas, por favor pida a su farmacia que se ponga en contacto con nuestra oficina. Randi lakes de fax es [place] [phone].  Si tiene un asunto urgente cuando la clnica est cerrada y que no puede esperar hasta el siguiente da hbil, puede llamar/localizar a su doctor(a) al nmero que aparece a continuacin.   Por favor, tenga en cuenta que aunque hacemos todo lo posible para estar disponibles para asuntos urgentes fuera del horario de [place], no estamos disponibles las 24 horas del da, los 7 [address]  [address] de la [place].   Si tiene un problema urgente y no puede comunicarse con nosotros, puede optar por buscar atencin mdica  en el consultorio de su doctor(a), en una clnica privada, en un centro de atencin urgente o en una sala de emergencias.  Si tiene Engineer, drilling, por favor  llame inmediatamente al 911 o vaya a la sala de emergencias.  Nmeros de bper  - Dr. Hester: [phone]  - Dra. Jackquline: [phone]  - Dr. Claudene: [phone]  - Dra. Kitts: [phone]  En caso de inclemencias del [place], por favor llame a nuestra lnea principal al [phone] para una actualizacin sobre el estado de cualquier retraso o cierre.  Consejos para la medicacin en dermatologa: Por favor, guarde las cajas en las que vienen los medicamentos de uso tpico para ayudarle a seguir las instrucciones sobre dnde y cmo usarlos. Las farmacias generalmente imprimen las instrucciones del medicamento slo en las cajas y no directamente en los tubos del [place].   Si su medicamento es muy caro, por favor, pngase en contacto con landry rieger llamando al [phone] y presione la opcin 4 o envenos un mensaje a travs de Clinical cytogeneticist.   No podemos decirle cul  ser su copago por los medicamentos por adelantado ya que esto es diferente dependiendo de la cobertura de su seguro. Sin embargo, es posible que podamos encontrar un medicamento sustituto a Audiological scientist un formulario para que el seguro cubra el medicamento que se considera necesario.   Si se requiere una autorizacin previa para que su compaa de seguros malta su medicamento, por favor permtanos de 1 a 2 das hbiles para completar este proceso.  Los precios de los medicamentos varan con frecuencia dependiendo del Environmental consultant de dnde se surte la receta y alguna farmacias pueden ofrecer precios ms baratos.  El sitio web www.goodrx.com tiene cupones para medicamentos de Health and safety inspector. Los precios aqu no tienen en cuenta lo que podra costar con la ayuda del seguro (puede ser ms barato con su seguro), pero el sitio web puede darle el precio si no utiliz Tourist information centre manager.  - Puede imprimir el cupn correspondiente y llevarlo con su receta a la farmacia.  - Tambin puede pasar por nuestra oficina durante el  horario de atencin regular y Education officer, museum una tarjeta de cupones de GoodRx.  - Si necesita que su receta se enve electrnicamente a una farmacia diferente, informe a nuestra oficina a travs de MyChart de [place] o por telfono llamando al [phone] y presione la opcin 4.

## 2024-09-06 NOTE — Telephone Encounter (Signed)
 Left message on voicemail for patient to return my call. When sending in Fluconazole  a high alert popped up stating that patient has prolonged QT interval. I sent a message to her cardiologist and am awaiting their response. Pt should also confirm with her cardiologist that medication is safe for her to use.

## 2024-09-11 ENCOUNTER — Encounter: Payer: Self-pay | Admitting: Cardiology

## 2024-09-11 ENCOUNTER — Telehealth: Payer: Self-pay

## 2024-09-11 NOTE — Telephone Encounter (Signed)
 PAP for lilly trulicity , filled and mailed pt portion and will fax provider portion

## 2024-09-11 NOTE — Telephone Encounter (Signed)
 Patient called about starting Diflucan , discussed with patient we have not got approval from her cardiologist, patient report she will contact her cardiologist then let us  know

## 2024-09-12 ENCOUNTER — Encounter (HOSPITAL_COMMUNITY): Payer: Self-pay | Admitting: Clinical

## 2024-09-12 ENCOUNTER — Ambulatory Visit (HOSPITAL_COMMUNITY): Admitting: Clinical

## 2024-09-12 DIAGNOSIS — F609 Personality disorder, unspecified: Secondary | ICD-10-CM | POA: Diagnosis not present

## 2024-09-12 DIAGNOSIS — Z8659 Personal history of other mental and behavioral disorders: Secondary | ICD-10-CM

## 2024-09-12 DIAGNOSIS — F315 Bipolar disorder, current episode depressed, severe, with psychotic features: Secondary | ICD-10-CM

## 2024-09-12 MED ORDER — FLUCONAZOLE 150 MG PO TABS: 150.0000 mg | ORAL_TABLET | ORAL | 0 refills | Status: AC
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
## 2024-09-12 NOTE — Telephone Encounter (Signed)
 From: Darliss Rogue, MD  Sent: 09/06/2024   6:05 PM EDT  To: Alm JAYSON Rhyme, MD; Rosina DELENA Mayans, CMA  Subject: RE: Fluconazole  reaction with current medica*   Ok for medication from cardiac perspective.   ----- Message -----  From: Mayans Rosina DELENA, CMA  Sent: 09/06/2024   3:44 PM EDT  To: Alm JAYSON Rhyme, MD; Rogue Darliss, MD  Subject: Fluconazole  reaction with current medications   Good afternoon,   Dr. Rhyme would like to prescribe Fluconazole  150 mg po QW x 4 doses, but a high alert about prolonged QT interval prolongation popped up when trying to e-scribe. He would like for you to advise whether it is safe for patient to start medication. Thank you for your time.   MyChart message sent to patient and prescription sent to pharmacy.

## 2024-09-14 DIAGNOSIS — R5383 Other fatigue: Secondary | ICD-10-CM | POA: Diagnosis not present

## 2024-09-14 DIAGNOSIS — R200 Anesthesia of skin: Secondary | ICD-10-CM | POA: Diagnosis not present

## 2024-09-14 DIAGNOSIS — R531 Weakness: Secondary | ICD-10-CM | POA: Diagnosis not present

## 2024-09-14 DIAGNOSIS — F32A Depression, unspecified: Secondary | ICD-10-CM | POA: Diagnosis not present

## 2024-09-14 DIAGNOSIS — G35D Multiple sclerosis, unspecified: Secondary | ICD-10-CM | POA: Diagnosis not present

## 2024-09-14 DIAGNOSIS — R413 Other amnesia: Secondary | ICD-10-CM | POA: Diagnosis not present

## 2024-09-14 NOTE — Progress Notes (Signed)
 Today the history is gathered from: 100% - patient  0% - alone in the office  RECORDS SUMMARY: Requesting records.  REFERRING PHYSICIAN: Jimmy Charlie FERNS, MD PRIMARY CARE PHYSICIAN:  Jimmy Charlie FERNS, MD   IMPRESSION/PLAN  Emily Moon is a 63 y.o. female presenting for evaluation of  MS/ WEAKNESS/ FATIGUE/ NUMBNESS/ MEMORY LOSS/ DEPRESSION/  - Worsening, again. - Patient reports no new MS flare-ups. Endorses worsening memory issues, including repeating questions and conversations, and struggling to recall familiar names, faces, and locations. Reports muscle spasms, numbness, and tingling in both lower extremities. Sleep is stable, and her mood is described as okay. Taking metformin  500 mg twice a day, Methocarbamol  500 mg in the morning and 750 nightly.  - Refer to Occupational Therapy for upper extremities weakness and incoordination.  - Reviewed [facility] evaluation for possible multiple sclerosis, ongoing.  - Recommend that patient brings in medical records from [facility] to facilitate ordering Lab tests as requested.  - Recommend patient to apply for the charity care program through [facility] or [place], as patient reports being unable to afford medical care and requires extensive diagnostic work-up. - Continue taking Metformin  500 mg twice a day.  - Continue taking Methocarbamol  500 mg in the morning and 750 nightly.   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 Lane in 2 months.  p=4  NSS  CHIEF COMPLAINT & HPI  Emily Moon is a 63 y.o. female presenting for evaluation of: Chief Complaint  Patient presents with  . MS/ WEAKNESS/ FATIGUE/ NUMBNESS  . MEMORY LOSS/ DEPRESSION/     MS/ WEAKNESS/ FATIGUE/ NUMBNESS/ MEMORY LOSS/ DEPRESSION/  Patient reports no new MS flare-ups. She continues to experience pain in bilateral legs and her neck, along with occasional imbalance, though she has not had  any falls. She is experiencing worsening memory issues, including repeating questions and conversations, and struggling to recall familiar names, faces, and locations. Notes difficulty remembering to take medications. Reports muscle spasms, numbness, and tingling in both lower extremities. Additionally, she reports urinating 30-40 times a day. Sleep is stable, and her mood is described as okay. Taking Metformin  500 mg twice a day, Methocarbamol  500 mg in the morning and 750 nightly.   Lymphs Abs  12/25/21 - 0.8 02/19/20 - 1.8  DATA SUMMARY: 08/28/2024 CT HEAD WITHOUT CONTRAST IMPRESSION:  1. No acute intracranial abnormality.  2. Clear sinuses.   08/27/2023 DG FL GUIDED LP IMPRESSION:  Technically successful lumbar puncture at L4-L5 level with  collection of 4 mL of clear CSF. Opening pressure: 19 cm water  Closing pressure: 16 cm water    04/21/2023 EMG UPPERS Impression: Normal study.  There is no electrodiagnostic evidence of a large fiber neuropathy in the upper extremities.  02/15/2022 MR BRAIN WO IMPRESSION:  1. Scattered foci of FLAIR signal abnormality in the supratentorial  white matter are overall not significantly changed since 2021. These  are nonspecific, but are in a distribution that can be seen with  multiple sclerosis. No diffusion restriction to suggest active  demyelination.  2. No acute intracranial pathology.   03/29/2020 L spine MRI IMPRESSION:  L2-3: Disc bulge. Bilateral facet degeneration and hypertrophy. No  compressive stenosis.   L3-4: Disc bulge more prominent towards the left. Facet and  ligamentous hypertrophy. Left lateral recess stenosis that could  possibly be symptomatic.   L4-5: Chronic facet arthropathy with 6 mm of anterolisthesis.  Bulging of the disc. Stenosis of the lateral  recesses and foramina  but without distinct focal neural compression.   L5-S1: Chronic disc degeneration and facet osteoarthritis. No  stenosis.    Findings could certainly relate to back pain. As far as focal neural  compression, the most likely location would seem to be the left  lateral recess at L3-4.     02/24/2020 MRI Brain and cervical spine with and without IMPRESSION:  MRI HEAD IMPRESSION:  1. Scattered multifocal foci of T2/FLAIR hyperintensities involving  the left greater than right cerebral hemispheres, compatible with  history of multiple sclerosis. No evidence for active demyelination.  2. Otherwise normal brain MRI for age.   MRI CERVICAL SPINE IMPRESSION:  1. Normal MRI appearance of the cervical spinal cord. No evidence  for demyelinating disease.  2. Degenerative spondylosis at C4-5 and C5-6 with resultant moderate  spinal stenosis, with severe bilateral C5 and C6 foraminal  narrowing.  3. Left foraminal disc osteophyte complex at C6-7 with resultant  severe left C7 foraminal stenosis.  4. Right eccentric disc bulge with uncovertebral spurring at C3-4  with resultant moderate right C4 foraminal narrowing.     VISIT SUMMARIES: 03/10/22: Patient with MS, ongoing. Start ocrevus infusions. Continue methocarbamol  500 mg nightly, refilled.    02/05/22: Patient with MS, stable, and memory difficulty, new to me. Start gilenya. Continue methocarbamol  500 mg nightly, refilled. Will preauthorize and order brain MRI without contrast to evaluate for new lesions that could be contributing to memory loss.   MEDICATIONS Current Outpatient Medications  Medication Sig Dispense Refill  . ASPERCREME, LIDOCAINE , TOP Apply topically once daily       . aspirin  81 MG EC tablet Take 81 mg by mouth once daily       . azelastine  (ASTELIN ) 137 mcg nasal spray Place 2 sprays into both nostrils once daily       . BIOTIN  ORAL Take 200 mg by mouth once daily    . cetirizine (ZYRTEC) 10 MG tablet Take 10 mg by mouth 2 (two) times daily    . cholecalciferol  (VITAMIN D3) 1000 unit tablet Take 3,000 Units by mouth once daily    .  clindamycin  (CLEOCIN  T) 1 % topical solution Apply 1-2 times daily to scalp as needed for itch.    . cyanocobalamin  (VITAMIN B12) 1,000 mcg/mL injection Inject into the muscle monthly    . desipramine  (NORPRAMIN ) 25 MG tablet Take 25 mg by mouth once daily       . diclofenac  (VOLTAREN ) 1 % topical gel Apply topically 4 (four) times daily as needed    . DULoxetine  (CYMBALTA ) 20 MG DR capsule Take 40 mg by mouth once daily       . evolocumab  (REPATHA  SURECLICK) 140 mg/mL PnIj Inject subcutaneously every 14 (fourteen) days       . fluocinonide  (LIDEX ) 0.05 % external solution Apply 1 Application  topically as directed    . FREESTYLE LIBRE 14 DAY reader     . FREESTYLE LIBRE 14 DAY SENSOR kit     . Herbal Supplement Herbal Name: Delta 8 gummies    . hydrocortisone 2.5 % cream Apply BID to affected area in underarms and groin prn flares    . hydrOXYzine  (VISTARIL ) 50 MG capsule Take 100 mg by mouth nightly       . lamoTRIgine  (LAMICTAL ) 150 MG tablet Take 300 mg by mouth nightly       . lysine  1,000 mg Tab Take 3,000 mg by mouth once daily    . meclizine  (  ANTIVERT ) 25 mg tablet Take 0.5-1 tablets by mouth 3 (three) times daily as needed    . metFORMIN  (GLUCOPHAGE ) 500 MG tablet TAKE 1 TABLET BY MOUTH TWICE A DAY WITH FOOD 180 tablet 1  . methocarbamoL  (ROBAXIN ) 500 MG tablet Take 500 mg (1 tab) in the morning and 750 mg (1.5 tabs) at night for muscle spasms 75 tablet 3  . metoprolol  tartrate (LOPRESSOR ) 50 MG tablet Take 1 tablet by mouth 2 (two) times daily    . montelukast  (SINGULAIR ) 4 MG chewable tablet Take 4 mg by mouth at bedtime    . neomycin -bacitracin-polymyxin (NEOSPORIN) ointment Apply topically 4 (four) times daily as needed    . pantoprazole  (PROTONIX ) 40 MG DR tablet Take 40 mg by mouth once daily       . polyethylene glycol (MIRALAX ) powder Take 17 g by mouth once daily as needed       . roflumilast  (ZORYVE ) 0.3 % Crea Apply topically once daily    . ruxolitinib (OPZELURA ) 1.5 %  Crea Apply topically 2 (two) times daily    . temazepam  (RESTORIL ) 30 mg capsule Take 60 mg by mouth at bedtime as needed    . tiotropium-olodateroL (STIOLTO RESPIMAT ) 2.5-2.5 mcg/actuation inhaler Inhale into the lungs once daily as needed    . traZODone  (DESYREL ) 50 MG tablet Take 150 mg by mouth at bedtime    . TRULICITY  0.75 mg/0.5 mL pen injector Inject 0.75 mg subcutaneously once a week       . valACYclovir  (VALTREX ) 1000 MG tablet Take by mouth once daily as needed    . calcipotriene -betamethasone (TACLONEX) 0.005-0.064 % ointment Apply topically once daily (Patient not taking: Reported on 09/14/2024)    . ciclopirox  (LOPROX ) 0.77 % cream Apply 1 Application topically as directed       . clobetasoL  (CORMAX ) 0.05 % external solution Mix clobetasol  solution with  CeraVe cream Use twice daily to affected areas.Avoid Face, groin and underarm (Patient not taking: Reported on 09/14/2024)    . clobetasoL  (OLUX ) 0.05 % topical foam Apply topically 2 (two) times daily    . docusate (COLACE) 100 MG capsule Take 100 mg by mouth 2 (two) times daily as needed (Patient not taking: Reported on 09/14/2024)    . fenofibrate  nanocrystallized (TRICOR ) 145 MG tablet Take 145 mg by mouth once daily       . fingolimod (GILENYA) 0.5 mg Take 1 capsule (0.5 mg total) by mouth once daily (Patient not taking: Reported on 09/08/2023) 30 capsule 1  . hydroquinone 4 % cream Apply topically once daily as needed    . levalbuterol  (XOPENEX  HFA) inhaler Inhale 2 inhalations into the lungs every 6 (six) hours as needed    (Patient not taking: Reported on 09/14/2024)    . levalbuterol  (XOPENEX ) 1.25 mg/3 mL nebulizer solution USE 1 VIAL VIA NEBULIZER EVERY 4 HOURS AS NEEDED FOR WHEEZING OR SHORTNESS OF BREATH (Patient not taking: Reported on 09/14/2024)    . olopatadine (PATADAY) 0.2 % ophthalmic solution Place 1 drop into both eyes once daily (Patient not taking: Reported on 09/08/2023)    . risperiDONE (RISPERDAL) 0.25 MG  tablet TAKE 1 TAB NIGHTLY FOR 1 WEEK, THEN INCREASE TO 2 TABS NIGHTLY FOR MOOD STABILIZATION. (Patient not taking: Reported on 09/14/2024) 180 tablet 1   No current facility-administered medications for this visit.    ALLERGIES Allergies  Allergen Reactions  . Albuterol  Shortness Of Breath    Makes pt feel jittery& flushed, increases heart rate  . Acetaminophen   Swelling    Patient tolerates in small doses  . Levofloxacin  In D5w Diarrhea and Itching    Shoulder pain  . Naproxen Swelling  . Naproxen Sodium Swelling    Patient tolerates in small doses  . Rosuvastatin  Muscle Pain    Joint pain and hair loss    . Triazolam Dizziness and Headache    Bladder problems    . Ozempic  [Semaglutide ] Rash    Waking constipation  . Zinc Other (See Comments)    constipation   . Aripiprazole Other (See Comments)    Muscle tension/cramping  . Cefaclor Other (See Comments)    Doesn't remember---unsure if actually allergic   . Diclofenac  Sodium Nausea and Vomiting  . Ibuprofen  Swelling    Patient tolerates in small doses  . Levofloxacin  Diarrhea and Itching  . Sulfa  (Sulfonamide Antibiotics) Itching    Unsure of reaction possibly itching  . Tramadol  Itching and Nausea And Vomiting     EXAM   There were no vitals filed for this visit.  There is no height or weight on file to calculate BMI.  We were not able to do thorough physical exam during this televisit. Neurological exam is a crucial part of patient evaluation and lack of it can lead to misdiagnosis or missed diagnosis. If patient has concerns, they should consider making in person appointment with the provider. Provider should not be liable for consequences of lack of in person exam.   GENERAL: Pleasant female, NAD. Normocephalic and atraumatic.   PAST MEDICAL HISTORY Past Medical History:  Diagnosis Date  . Arthritis   . Asthma, unspecified asthma severity, unspecified whether complicated, unspecified whether persistent  (HHS-HCC)   . Bipolar disorder (CMS/HHS-HCC) 05/21/2014   Last Assessment & Plan:  Continues to follow with psychiatry. Stable on Cymbalta , Vistaril , Lamictal , temazepam , zolpidem .  . Cataracts, bilateral   . Chronic pain syndrome 01/07/2011   Last Assessment & Plan:  Agreed to refill her medications but I did advise that after her hip injections, we will want to try to wean this down again. No longer lifting her mom to care for her so hopefully pain will improve. The patient indicates understanding of these issues and agrees with the plan.  SABRA COPD (chronic obstructive pulmonary disease) (CMS/HHS-HCC)   . DDD (degenerative disc disease), cervical   . Depression   . Diabetes mellitus without complication (CMS/HHS-HCC)   . Dizziness   . Dyspnea   . Edema   . Esophageal reflux 08/28/2010   Overview:  Qualifier: Diagnosis of  By: Jenetta MD, Talia    Last Assessment & Plan:  Stable. Currently taking Aloe as she's heard it can help.  . Fibromyalgia syndrome   . Fungal infection of nail   . Gout   . Headache   . Heart palpitations   . Hip dysplasia, congenital (HHS-HCC)   . HLD (hyperlipidemia) 01/20/2011   Overview:  Qualifier: Diagnosis of  By: Jenetta MD, Talia    Last Assessment & Plan:  Due last month for re-draw, has not completed yet. She is not fasting today. Labs ordered for later this week.  Poor diet and is not taking Fish Oil. Cannot tolerate statins. Recommendations made for improvements in diet. Referral to nutritionist made.  . Hypercholesterolemia   . Multiple sclerosis 08/28/2010   Overview:     Last Assessment & Plan:  Appointment scheduled next week with neurologist in [place]. Continues to experience symptoms of weakness, falls, chronic pain.  . Nephrolithiasis   . Osteoporosis   .  Pneumonia   . PONV (postoperative nausea and vomiting)   . Psoriasis   . Sleep apnea   . Tachycardia   . Urinary frequency     PAST SURGICAL HISTORY Past Surgical History:  Procedure  Laterality Date  . Left total hip arthroplasty  2013  . Foot surgery  2015  . tissue biopsy of eye  2015  . Cataract extraction w/phaco(Left)  05/21/2015  . Ptosis repair(Bilateral)  02/18/2016  . Cystoscopy/ureteroscopy/holmium laser/stent placement(Bilateral)  09/22/2016  . Cataract extraction w/phaco(Right)  [id number]  . LITHOTRIPSY    . right humb surgery      FAMILY HISTORY Family History  Problem Relation Name Age of Onset  . No Known Problems Mother    . No Known Problems Father      SOCIAL HISTORY  Social History   Tobacco Use  . Smoking status: Former    Passive exposure: Past  . Smokeless tobacco: Never  Vaping Use  . Vaping status: Former  Substance Use Topics  . Alcohol  use: Never  . Drug use: Yes    Comment: Marujana gummies occasionally     REVIEW OF SYSTEMS:  13 system ROS form was given to the patient to complete and I have reviewed it. The form was sent for scan to the patient's EHR.  Pertinent positives and negatives are mentioned above in the HPI and all other systems are negative.   DATA  I have personally reviewed all of the data outlined below both prior to the appointment and during the appointment with the patient as appropriate.  No visits with results within 6 Month(s) from this visit.  Latest known visit with results is:  Initial consult on 01/29/2023  Component Date Value Ref Range Status  . IgG P93 Ab. - [facility] 01/29/2023 Absent   Final  . IgG P66 Ab. - [facility] 01/29/2023 Absent   Final  . IgG P58 Ab. - [facility] 01/29/2023 Present (!)   Final  . IgG P45 Ab. - [facility] 01/29/2023 Absent   Final  . IgG P41 Ab. - [facility] 01/29/2023 Absent   Final  . IgG P39 Ab. - [facility] 01/29/2023 Absent   Final  . IgG P30 Ab. - [facility] 01/29/2023 Absent   Final  . IgG P28 Ab. - [facility] 01/29/2023 Absent   Final  . IgG P23 Ab. - [facility] 01/29/2023 Absent   Final  . IgG P18 Ab. - [facility] 01/29/2023 Absent   Final  . Lyme Western Blot IgG - [facility]  01/29/2023 Negative   Final  . IgM P41 Ab. - [facility] 01/29/2023 Absent   Final  . IgM P39 Ab. - [facility] 01/29/2023 Absent   Final  . IgM P23 Ab. - [facility] 01/29/2023 Absent   Final  . Lyme Western Blot IgM - [facility] 01/29/2023 Negative   Final      No follow-ups on file.  Payor: [employer]ANTAGE / Plan: [employer]ANT[employer] / Product Type: PPO /  This note is partially written by Sarah Almashhadani, in the presence of and acting as the scribe of Dr. Arthea Farrow.    This video encounter was conducted with the patient's (or proxy's) verbal consent via secure, interactive audio and video telecommunications while in clinic/office/hospital.  The patient (or proxy) was instructed to have this encounter in a suitably private space and to only have persons present to whom they give permission to participate. In addition, patient identity was confirmed by use of name plus an additional identifier.  This visit was coded  based on medical decision making (MDM).  I have reviewed, edited and added to the note as needed to reflect my best personal medical judgment.    Dr. Arthea Farrow, MD [facility] [place], [place] Ph:  [phone] Fax:  [phone]

## 2024-09-16 ENCOUNTER — Other Ambulatory Visit: Payer: Self-pay | Admitting: Family

## 2024-09-16 ENCOUNTER — Other Ambulatory Visit: Payer: Self-pay | Admitting: Physician Assistant

## 2024-09-19 ENCOUNTER — Ambulatory Visit: Admitting: Physician Assistant

## 2024-09-19 VITALS — BP 119/77 | HR 92

## 2024-09-19 DIAGNOSIS — N3281 Overactive bladder: Secondary | ICD-10-CM | POA: Diagnosis not present

## 2024-09-19 NOTE — Progress Notes (Signed)
 PTNS  Session # 27  Health & Social Factors: no change Caffeine : 1 Alcohol : 0 Daytime voids #per day: 30 Night-time voids #per night: 3-4 Urgency: strong-severe Incontinence Episodes #per day: 1 Ankle used: left Treatment Setting: 4 Feeling/ Response: sensory Comments: Patient tolerated well.  Performed By: Karena Kinker, PA-C   Follow Up: 1 month

## 2024-09-25 ENCOUNTER — Ambulatory Visit: Admitting: Occupational Therapy

## 2024-09-26 ENCOUNTER — Ambulatory Visit: Admitting: Internal Medicine

## 2024-09-27 ENCOUNTER — Ambulatory Visit: Attending: Neurology

## 2024-09-27 DIAGNOSIS — M6281 Muscle weakness (generalized): Secondary | ICD-10-CM | POA: Diagnosis not present

## 2024-09-27 DIAGNOSIS — G35D Multiple sclerosis, unspecified: Secondary | ICD-10-CM | POA: Insufficient documentation

## 2024-09-27 DIAGNOSIS — R278 Other lack of coordination: Secondary | ICD-10-CM | POA: Insufficient documentation

## 2024-09-27 NOTE — Therapy (Signed)
 OUTPATIENT OCCUPATIONAL THERAPY NEURO EVALUATION  Patient Name: Emily Moon MRN: [id number] DOB:[birth year], 63 y.o., female Today's Date: 10/01/2024  PCP: Dr. Jacques Moon  REFERRING PROVIDER: Dr. Arthea Moon   END OF SESSION:  OT End of Session - 10/01/24 1528     Visit Number 1    Number of Visits 24    Date for Recertification  12/20/24    OT Start Time 1315    OT Stop Time 1400    OT Time Calculation (min) 45 min    Activity Tolerance Patient tolerated treatment well    Behavior During Therapy [facility] for tasks assessed/performed         Past Medical History:  Diagnosis Date   Allergy 11-16-88   See med list on mychart.   Anxiety 11-16-89   Arthritis 11-17-87   Asthma 11-16-88   Bipolar disorder (HCC) 05/21/2014   Cataracts, bilateral    Chronic pain syndrome    COPD (chronic obstructive pulmonary disease) (HCC) 11-17-19   DDD (degenerative disc disease), cervical    also back   Depression 09-01 1990   Diabetes mellitus without complication (HCC) 11-16-2020   Emphysema of lung (HCC) 01-15-20   Fibromyalgia syndrome    GERD (gastroesophageal reflux disease) 11-16-92   Gout    Hip dysplasia, congenital 09/15/2013   History of kidney stones    Hx of sepsis    Hypercholesterolemia    IDA (iron  deficiency anemia)    Multiple sclerosis    weakness   Osteoporosis    osteoarthritis   Prediabetes 07/22/2018   Psoriasis    Seasonal allergies    Sleep apnea 2012   sleep study / slight, no interventions   Vitamin B 12 deficiency    Past Surgical History:  Procedure Laterality Date   CATARACT EXTRACTION W/PHACO Left 05/21/2015   Procedure: CATARACT EXTRACTION PHACO AND INTRAOCULAR LENS PLACEMENT (IOC);  Surgeon: Emily Carmine, MD;  Location: [facility];  Service: Ophthalmology;  Laterality: Left;  US  00:35 AP% 22.9 CDE 8.11 fluid pack lot #[id number]   CATARACT EXTRACTION W/PHACO Right 06/04/2015   Procedure: CATARACT EXTRACTION PHACO AND INTRAOCULAR LENS PLACEMENT  (IOC);  Surgeon: Emily Carmine, MD;  Location: [facility];  Service: Ophthalmology;  Laterality: Right;  US :00:48 AP%: 10.5 CDE:5.08 Fluid lot #[id number]   CYSTOSCOPY/URETEROSCOPY/HOLMIUM LASER/STENT PLACEMENT Bilateral 09/22/2016   Procedure: CYSTOSCOPY/URETEROSCOPY/HOLMIUM LASER/STENT PLACEMENT;  Surgeon: Emily Riis, MD;  Location: [facility];  Service: Urology;  Laterality: Bilateral;   EYE SURGERY  2015 and 2016   tissue biopsy   FOOT SURGERY  11/16/2013   JOINT REPLACEMENT Left 2013 and 2022   hip replacement   LITHOTRIPSY     PTOSIS REPAIR Bilateral 02/18/2016   Procedure: BILATERAL PTOSIS REPAIR UPPER EYELIDS;  Surgeon: Emily CHRISTELLA Gay, MD;  Location: [facility];  Service: Ophthalmology;  Laterality: Bilateral;  LEAVE PT EARLY AM   thumb surgery Right    TONSILLECTOMY  11/17/1971   TOTAL HIP ARTHROPLASTY Right 07/02/2021   Procedure: TOTAL HIP ARTHROPLASTY ANTERIOR APPROACH;  Surgeon: Emily Lerner, MD;  Location: [facility];  Service: Orthopedics;  Laterality: Right;   Patient Active Problem List   Diagnosis Date Noted   BMI 35.0-35.9,adult 08/03/2023   SVT (supraventricular tachycardia) 04/15/2023   Hypoglycemia associated with type 2 diabetes mellitus (HCC) 04/06/2023   Metabolic syndrome 04/06/2023   Chronic urticaria 12/03/2022   Angioedema 12/03/2022   Chronic pain of left ankle 04/08/2022   Fracture of tibial plateau 04/08/2022   Prolonged QT interval 03/23/2022  MS (multiple sclerosis) 02/05/2022   Memory loss or impairment 02/05/2022   Iron  deficiency anemia 12/25/2021   Myalgia due to statin 11/04/2021   Type 2 diabetes mellitus with hyperglycemia (HCC) 08/28/2020   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   Psoriasis 11/02/2019   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   Asthma 02/10/2019   Obstructive sleep apnea syndrome 12/02/2018   Environmental and seasonal allergies 10/24/2018   Vitamin D  deficiency 02/15/2018    Preventative health care 11/24/2017   B12 deficiency 05/22/2016   Cobalamin deficiency 05/22/2016   Chronic back pain 10/31/2014   Bipolar disorder (HCC) 05/21/2014   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   Insomnia 09/01/2011   HLD (hyperlipidemia) 01/20/2011   Chronic pain syndrome 01/07/2011   RENAL CALCULUS, RECURRENT 11/13/2010   Kidney stone 11/13/2010   Multiple sclerosis, progressive relapsing 08/28/2010   GERD 08/28/2010   NEPHROLITHIASIS, HX OF 08/28/2010   Gastroesophageal reflux disease 08/28/2010   History of urinary stone 08/28/2010   ONSET DATE: 1991  REFERRING DIAG: MS  THERAPY DIAG:  Muscle weakness (generalized)  Other lack of coordination  Multiple sclerosis  Rationale for Evaluation and Treatment: Rehabilitation  SUBJECTIVE:  SUBJECTIVE STATEMENT: Pt reports that she's had OT in the past and she finds a lot of benefit from it. Pt accompanied by: self  PERTINENT HISTORY:   Pt reports she was diagnosed with MS in 1991, experienced worsening weakness in the arms and hands in 2009, which further worsened about 6 weeks ago.  Per chart from Dr. Lane on 09/14/24:  Emily Moon is a 63 y.o. female presenting for evaluation of  MS/ WEAKNESS/ FATIGUE/ NUMBNESS/ MEMORY LOSS/ DEPRESSION/  - Worsening, again. - Patient reports no new MS flare-ups. Endorses worsening memory issues, including repeating questions and conversations, and struggling to recall familiar names, faces, and locations. Reports muscle spasms, numbness, and tingling in both lower extremities. Sleep is stable, and her mood is described as okay. Taking metformin  500 mg twice a day, Methocarbamol  500 mg in the morning and 750 nightly.  - Refer to Occupational Therapy for upper extremities weakness and incoordination.  - Reviewed [facility] evaluation for possible multiple sclerosis, ongoing.  - Recommend that patient brings in medical records from [facility] to facilitate ordering Lab tests as requested.  -  Recommend patient to apply for the charity care program through [facility] or [place], as patient reports being unable to afford medical care and requires extensive diagnostic work-up. - Continue taking Metformin  500 mg twice a day.  - Continue taking Methocarbamol  500 mg in the morning and 750 nightly.   PRECAUTIONS: Fall  WEIGHT BEARING RESTRICTIONS: No  PAIN:  Are you having pain? Yes: NPRS scale: 3-4/10  Pain location: hamstrings, low back, everything always hurts  Pain description: hamstrings feel like stretching and on fire, low back is aching Aggravating factors: cold weather, chronic pain d/t MS Relieving factors: medication  FALLS: Has patient fallen in last 6 months? No  LIVING ENVIRONMENT: Lives with: lives alone Lives in: ground level apartment Stairs: No Has following equipment at home: Single point cane, Environmental Consultant - 2 wheeled, Environmental Consultant - 4 wheeled, bed side commode, and Grab bars  PLOF: Needs assistance with homemaking.  Pt has 2 women who provide caregiver assistance on different week days.  Pt reports they assist with laundry, housekeeping, running errands.  Pt is indep with basic ADLs at baseline.  Caregivers are present M, W, Th, F between 2 and 5 hours per day.  PATIENT GOALS: To strengthen my hands and my arms.  OBJECTIVE:  Note: Objective measures were completed at Evaluation unless otherwise noted.  HAND DOMINANCE: Right  ADLs: Overall ADLs: Pt reports that she's been dropping ADL supplies more often over the last 6 weeks, including her phone Transfers/ambulation related to ADLs: 4 wheeled walker for community distances  Eating: difficulty manipulating eating utensils for cutting food Grooming: Pt acknowledges dropping toothpaste tube/cap UB Dressing: indep LB Dressing: indep  Toileting: increased effort d/t low toilet; pt reports she's trying to get a standard toilet from apt complex Bathing: Walkin shower; tries to shower when caregivers are present (distant  supv) Tub Shower transfers: distant supv-modified indep  IADLs: Shopping: caregivers manage at baseline Light housekeeping: caregivers manage at baseline Meal Prep: difficulty opening jars; pt reports that she orders out a lot Community mobility: modified indep with rollator Medication management: indep Financial management: indep Handwriting: 100% legible  LEISURE:  Pt is an tree surgeon, 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  Pt reports she can paint with water  colors on flat table top, as arms are supported.    POSTURE COMMENTS:  No Significant postural limitations  ACTIVITY TOLERANCE: Activity tolerance: To be further assessed within upcoming sessions  FUNCTIONAL OUTCOME MEASURES: MAM-20 for neurological conditions: TBD  UPPER EXTREMITY ROM:  BUEs WFL  UPPER EXTREMITY MMT:     Active ROM Right eval Left eval  Shoulder flexion 4- 4-  Shoulder abduction 4- 4-  Shoulder adduction    Shoulder extension    Shoulder internal rotation 4- 4-  Shoulder external rotation 4- 4-  Elbow flexion 4 4  Elbow extension 4+ 4+  Wrist flexion 4 4  Wrist extension 4- 4-  Wrist ulnar deviation    Wrist radial deviation    Wrist pronation    Wrist supination    (Blank rows = not tested)  HAND FUNCTION: Grip strength: Right: 45 lbs; Left: 54 lbs, Lateral pinch: Right: 14 lbs, Left: 15 lbs, and 3 point pinch: Right: 13 lbs, Left: 14 lbs  COORDINATION: 9 Hole Peg test: Right: 24 sec; Left: 27 sec  SENSATION: Pt reports numbness in palms   EDEMA: No visible edema  MUSCLE TONE: RUE: Within functional limits and LUE: Within functional limits  COGNITION: Overall cognitive status: Impaired; pt reports worsening memory and word finding (SLP eval recommended)  VISION: Subjective report: reading glasses, hx of cataract sx in 2016; pt reports things are darker/dimmer in R eye, no reports of diplopia   PERCEPTION: WFL  PRAXIS:  WFL  OBSERVATIONS: Pt pleasant, cooperative, and reports eagerness to improve BUE strength and coordination.                                                                                                 TREATMENT DATE: 09/27/24: Evaluation completed.  Self Care: -Review of evaluation findings, OT role, and goals for poc   PATIENT EDUCATION: Education details: OT role, goals, poc Person educated: Patient Education method: Explanation Education comprehension: verbalized understanding  HOME EXERCISE PROGRAM: To be initiated in  upcoming sessions  GOALS: Goals reviewed with patient? Yes  SHORT TERM GOALS: Target date: 11/08/24  Pt will be indep to perform HEP for improving BUE strength for daily tasks. Baseline: Eval: Not yet initiated Goal status: INITIAL  LONG TERM GOALS: Target date: 12/20/24  Pt will increase MAM-20 score for neurological conditions by (TBD) or more points to indicate improvement in self perceived functional use of the BUEs for daily tasks.  Baseline: Eval: TBD Goal status: INITIAL  2.  Pt will increase R grip strength by 15 or more lbs, and L grip strength by 5 or more lbs to improve ability to open tight jars.  Baseline: R 45 lbs, L 54 lbs (non-dominant); difficulty opening jars for meal prep Goal status: INITIAL  3.  Pt will increase bilat shoulder flex/abd strength by 1 full muscle grade to improve tolerance for painting on a canvas. Baseline: Eval: R/L shoulder flex/abd 4-/5; pt reports inability to hold arms up to paint on a vertical canvas d/t weakness Goal status: INITIAL  4.  Pt will increase R/L lateral pinch strength by 3 or more lbs to ease ability to open food packages and grooming containers (ie toothpaste tubes/lotion).  Baseline: Eval: R 14 lbs, L 15 lbs  Goal status: INITIAL  ASSESSMENT:  CLINICAL IMPRESSION: Patient is a 63 y.o. female who was seen today for occupational therapy evaluation for functional decline related to BUE  weakness.  Pt with hx of MS, and endorses worsening weakness over the last 6 weeks.  Pt has caregiver assist most days of the week for IADLs, and is able to perform BADLs with distant supv-modified indep.  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  Pt also endorses increased difficulty with opening jars/containers.  Pt's primary goal is to improve bilat hand and arm strength for daily tasks.  Pt will benefit from skilled OT to address above noted deficits, working towards goals noted above in order to maximize indep with daily tasks and leisure activities, reduce burden of care on caregivers, and improve QOL.  PERFORMANCE DEFICITS: in functional skills including ADLs, IADLs, coordination, strength, pain, Fine motor control, mobility, balance, body mechanics, endurance, decreased knowledge of precautions, decreased knowledge of use of DME, and UE functional use, cognitive skills including memory and temperament/personality, and psychosocial skills including coping strategies, environmental adaptation, habits, and routines and behaviors.   IMPAIRMENTS: are limiting patient from ADLs, IADLs, and leisure.   CO-MORBIDITIES: has co-morbidities such as bipolar, asthma, anxiety, depression, DM2, COPD that affects occupational performance. Patient will benefit from skilled OT to address above impairments and improve overall function.  MODIFICATION OR ASSISTANCE TO COMPLETE EVALUATION: No modification of tasks or assist necessary to complete an evaluation.  OT OCCUPATIONAL PROFILE AND HISTORY: Detailed assessment: Review of records and additional review of physical, cognitive, psychosocial history related to current functional performance.  CLINICAL DECISION MAKING: Moderate - several treatment options, min-mod task modification  necessary  REHAB POTENTIAL: Good  EVALUATION COMPLEXITY: Moderate    PLAN:  OT FREQUENCY: 2x/week  OT DURATION: 12 weeks  PLANNED INTERVENTIONS: 97168 OT Re-evaluation, 97535 self care/ADL training, 02889 therapeutic exercise, 97530 therapeutic activity, 97112 neuromuscular re-education, 97140 manual therapy, 97010 moist heat, 97010 cryotherapy, 97034 contrast bath, 97750 Physical Performance Testing, psychosocial skills training, energy conservation, coping strategies training, patient/family  education, and DME and/or AE instructions  RECOMMENDED OTHER SERVICES: Recommendation for SLP eval d/t reported worsening memory and word finding  CONSULTED AND AGREED WITH PLAN OF CARE: Patient  PLAN FOR NEXT SESSION: Initiate HEP; complete MAM-20 for neurological conditions  Inocente Blazing, MS, OTR/L  Inocente MARLA Blazing, OT 10/01/2024, 3:30 PM

## 2024-09-28 ENCOUNTER — Other Ambulatory Visit: Payer: Self-pay | Admitting: Family Medicine

## 2024-09-28 ENCOUNTER — Encounter: Payer: Self-pay | Admitting: Internal Medicine

## 2024-09-28 ENCOUNTER — Ambulatory Visit: Admitting: Internal Medicine

## 2024-09-28 VITALS — BP 100/60 | HR 77 | Temp 98.7°F | Ht 68.0 in | Wt 229.0 lb

## 2024-09-28 DIAGNOSIS — E669 Obesity, unspecified: Secondary | ICD-10-CM

## 2024-09-28 DIAGNOSIS — G4733 Obstructive sleep apnea (adult) (pediatric): Secondary | ICD-10-CM | POA: Diagnosis not present

## 2024-09-28 DIAGNOSIS — J4489 Other specified chronic obstructive pulmonary disease: Secondary | ICD-10-CM | POA: Diagnosis not present

## 2024-09-28 DIAGNOSIS — E1165 Type 2 diabetes mellitus with hyperglycemia: Secondary | ICD-10-CM

## 2024-09-28 MED ORDER — TRELEGY ELLIPTA 200-62.5-25 MCG/ACT IN AEPB: 1.0000 | INHALATION_SPRAY | Freq: Every day | RESPIRATORY_TRACT | 5 refills | Status: DC

## 2024-09-28 MED ORDER — TRELEGY ELLIPTA 200-62.5-25 MCG/ACT IN AEPB: 1.0000 | INHALATION_SPRAY | Freq: Every day | RESPIRATORY_TRACT | Status: AC

## 2024-09-28 MED ORDER — LEVALBUTEROL TARTRATE 45 MCG/ACT IN AERO: 2.0000 | INHALATION_SPRAY | RESPIRATORY_TRACT | 12 refills | Status: AC | PRN

## 2024-09-28 NOTE — Patient Instructions (Addendum)
 Excellent Job A+ GOLD STAR!!  Continue CPAP as prescribed  Patient Instructions Continue to use CPAP every night, minimum of 4-6 hours a night.  Change equipment every 30 days or as directed by DME.  Wash your tubing with warm soap and water  daily, hang to dry. Wash humidifier portion weekly. Use bottled, distilled water  and change daily   Be aware of reduced alertness and do not drive or operate heavy machinery if experiencing this or drowsiness.  Exercise encouraged, as tolerated. Encouraged proper weight management.  Important to get eight or more hours of sleep  Limiting the use of the computer and television before bedtime.  Decrease naps during the day, so night time sleep will become enhanced.  Limit caffeine , and sleep deprivation.    Avoid Allergens and Irritants Avoid secondhand smoke Avoid SICK contacts Recommend  Masking  when appropriate Recommend Keep up-to-date with vaccinations  Lets plan to start Trelegy 200 1 puff once a day Please rinse mouth after use

## 2024-09-28 NOTE — Progress Notes (Signed)
 @Patient  ID: Elveria JAYSON Hoyle, female    DOB: [birth year], 62 y.o.   MRN: [id number]   TEST/EVENTS :  PFT April 29, 2020 that showed moderate restriction and airflow obstruction with FEV1 at 63%, ratio 72, FVC 68%, positive bronchodilator response.  DLCO 116%.      CC Follow up OSA Follow up COPD/ASTHMA   HPI: 63 yo female former smoker followed for OSA and COPD with asthma  Medical history significant for multiple sclerosis Patient is not on oxygen .  Remains independent at home.  Is able to drop.  Do her own housework.  She has had no recent flare of asthma or COPD.  No recent antibiotics or steroids. She feels her Stiolto dose has not helped Patient had been on Advair and does not feel like it is working therefore will try Trelegy inhaler   Discussed sleep data and reviewed with patient.  Encouraged proper weight management.  Discussed driving precautions and its relationship with hypersomnolence.  Discussed sleep hygiene, and benefits of a fixed sleep waked time.  The importance of getting eight or more hours of sleep discussed with patient.  Discussed limiting the use of the computer and television before bedtime.  Decrease naps during the day, so night time sleep will become enhanced.  Limit caffeine , and sleep deprivation.   Patient uses and benefits from therapy Using CPAP nightly and with naps Pressure setting is comfortable and is sleeping well. Auto CPAP 10-20 AHI Reduced to 0.3  No exacerbation at this time No evidence of heart failure at this time No evidence or signs of infection at this time No respiratory distress No fevers, chills, nausea, vomiting, diarrhea No evidence of lower extremity edema No evidence hemoptysis   Allergies  Allergen Reactions   Albuterol  Shortness Of Breath and Other (See Comments)    Makes pt feel jittery/ tacycardic   Crestor  [Rosuvastatin ] Other (See Comments)    Joint pain, muscle pain, and hair loss   Halcion [Triazolam]  Other (See Comments)    Dizziness,headaches,bladder problems   Levaquin  [Levofloxacin  In D5w] Diarrhea and Itching    Shoulder pain   Naproxen Sodium     Patient tolerates in small doses. Her reaction is swelling of the lower extremities.    Ozempic  (0.25 Or 0.5 Mg-Dose) [Semaglutide (0.25 Or 0.5mg -Dos)] Itching   Cefaclor Other (See Comments)    Doesn't remember---unsure if actually allergic    Latex Itching   Prednisone      Elevates blood sugars   Sulfa  Antibiotics Itching    Unsure of reaction possibly itching   Tramadol  Itching and Nausea And Vomiting   Zinc Other (See Comments)    constipation  constipation    Aripiprazole Other (See Comments)    Muscle tension/cramping   Diclofenac  Sodium Rash    made very sick   Iodinated Contrast Media Itching, Other (See Comments), Palpitations and Rash    Immunization History  Administered Date(s) Administered   Influenza Split 08/05/2011   Influenza, Seasonal, Injecte, Preservative Fre 06/26/2018, 08/22/2023   Influenza,inj,Quad PF,6+ Mos 08/17/2013, 07/13/2015, 07/06/2017, 06/26/2018, 06/26/2019, 07/04/2020, 09/03/2021   Influenza-Unspecified 07/08/2016, 06/18/2017, 06/26/2018, 08/06/2022   Novavax(Covid-19) Vaccine 08/22/2023   PFIZER(Purple Top)SARS-COV-2 Vaccination 02/06/2020, 02/28/2020, 07/01/2020, 03/19/2021   PNEUMOCOCCAL CONJUGATE-20 07/24/2022   Pfizer Covid-19 Vaccine Bivalent Booster 56yrs & up 09/03/2021   Pneumococcal Conjugate-13 07/05/2014   Pneumococcal Polysaccharide-23 07/13/2015    Past Medical History:  Diagnosis Date   Allergy 11-16-88   See med list on mychart.   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   History of kidney stones    Hx of sepsis    Hypercholesterolemia    IDA (iron  deficiency anemia)    Multiple sclerosis    weakness   Osteoporosis    osteoarthritis   Prediabetes 07/22/2018   Psoriasis    Seasonal allergies    Sleep apnea 2012   sleep study / slight, no interventions   Vitamin B 12 deficiency     Tobacco History: Social History   Tobacco Use  Smoking Status Former   Current packs/day: 0.00   Average packs/day: 1 pack/day for 25.0 years (25.0 ttl pk-yrs)   Types: Cigarettes   Start date: 25   Quit date: 2019   Years since quitting: 6.8   Passive exposure: Past  Smokeless Tobacco Never  Tobacco Comments   occasional use   Counseling given: Not Answered Tobacco comments: occasional use    Outpatient Medications Prior to Visit  Medication Sig Dispense Refill   aspirin  EC 81 MG tablet Take 81 mg by mouth daily. Swallow whole.     Azelastine  HCl 137 MCG/SPRAY SOLN PLACE 2 SPRAYS INTO BOTH NOSTRILS 2 (TWO) TIMES DAILY 30 mL 11   B Complex-Biotin -FA (BIG 100, BIOTIN , PO) Take 300 mg by mouth at bedtime.     B Complex-C-Zn-Folic Acid (DIALYVITE/ZINC) TABS Take 300 mg by mouth.     BD INTEGRA SYRINGE 25G X 1 3 ML MISC SMARTSIG:1 Syringe(s) Once a Month     Biotin  5 MG TBDP Take 200 mg by mouth.     bisacodyl  (DULCOLAX) 5 MG EC tablet Take 5 mg by mouth daily as needed for moderate constipation.     cetirizine (ZYRTEC) 10 MG tablet Take 10 mg by mouth daily.     clindamycin  (CLEOCIN  T) 1 % external solution Apply to scalp once or twice a day prn bumps 30 mL 2   clobetasol  cream (TEMOVATE ) 0.05 % Apply 1 Application topically 2 (two) times daily. Apply to aa QD-BID PRN up to two weeks. Then up to 5 days per week as needed. Avoid applying to face, groin, and axilla. Use as directed. Long-term use can cause  thinning of the skin. 15 g 1   Continuous Glucose Receiver (FREESTYLE LIBRE 3 READER) DEVI as directed.     Continuous Glucose Sensor (FREESTYLE LIBRE 3 SENSOR) MISC PLACE 1 SENSOR ON THE SKIN EVERY 14 DAYS. USE TO CHECK GLUCOSE CONTINUOUSLY 6 each 3   cyanocobalamin  (VITAMIN B12) 1000 MCG/ML injection INJECT 1 ML (1,000 MCG TOTAL) INTO THE MUSCLE EVERY 30 (THIRTY) DAYS. FOR B12 VITAMIN 3 mL 3   desipramine  (NORPRAMIN ) 25 MG tablet 2  qam 180 tablet 2   diphenhydrAMINE  (BENADRYL ) 2 % cream Apply topically 3 (three) times daily as needed for itching.     Dulaglutide  (TRULICITY ) 3 MG/0.5ML SOAJ Inject 3 mL sq once weekly. 6 mL 3   DULoxetine  (CYMBALTA ) 20 MG capsule Take 2 capsules (40 mg total) by mouth daily. 60 capsule 5   estradiol  (  ESTRACE ) 0.1 MG/GM vaginal cream Estrogen Cream Instruction Discard applicator Apply pea sized amount to tip of finger to urethra before bed. Wash hands well after application. Use Monday, Wednesday and Friday 42.5 g 11   Evolocumab  (REPATHA  SURECLICK) 140 MG/ML SOAJ INJECT 140 MG INTO THE SKIN EVERY 14 (FOURTEEN) DAYS. CALL TO SCHEDULE APPT PRIOR TO NEXT REFILL 6 mL 3   fenofibrate  (TRICOR ) 145 MG tablet TAKE 1 TABLET BY MOUTH DAILY FOR CHOLESTEROL *NEED APPOINTMENT 90 tablet 3   fluconazole  (DIFLUCAN ) 150 MG tablet Take 1 tablet (150 mg total) by mouth once a week for 4 doses. Take 1 tab weekly for 4 weeks 4 tablet 0   hydrOXYzine  (VISTARIL ) 50 MG capsule Take 2 capsules (100 mg total) by mouth at bedtime. TAKE 1 CAPSLE BY MOUTH AT NOON, 1 AT 6 PM, AND 1 AS NEEDED 180 capsule 2   ibuprofen  (ADVIL ) 200 MG tablet Take 800 mg by mouth 2 (two) times daily.     ketoconazole  (NIZORAL ) 2 % shampoo Apply 1 Application topically as directed. Wash from waist up 3 times a week for 2 months, then decrease to 1 time a month, let sit 5 minutes and rinse off 120 mL 6   L-Lysine  HCl 500 MG TABS Take 1,000 mg by mouth.     lamoTRIgine  (LAMICTAL ) 150 MG tablet Take 2 tablets (300 mg  total) by mouth daily at bedtime. 180 tablet 5   levalbuterol  (XOPENEX  HFA) 45 MCG/ACT inhaler Inhale 2 puffs into the lungs every 4 (four) hours as needed for wheezing.     lidocaine  (LMX) 4 % cream Apply 1 Application topically daily as needed.     Lysine  500 MG TABS Take 1,000 mg by mouth daily.     metFORMIN  (GLUCOPHAGE ) 500 MG tablet TAKE 2 TABS BY MOUTH EVERY MORNING AND 1 TAB IN THE EVENING 270 tablet 1   methocarbamol  (ROBAXIN ) 500 MG tablet TAKE 1-2 TABLETS (500-1,000 MG TOTAL) BY MOUTH AT BEDTIME AS NEEDED FOR MUSCLE SPASMS. 180 tablet 0   metoprolol  tartrate (LOPRESSOR ) 50 MG tablet TAKE 1 TABLET BY MOUTH TWICE A DAY 180 tablet 3   montelukast  (SINGULAIR ) 10 MG tablet TAKE 1 TABLET BY MOUTH EVERYDAY AT BEDTIME 90 tablet 0   mupirocin  ointment (BACTROBAN ) 2 % Apply 1 Application topically daily. Qd to wound on finger 22 g 1   neomycin -bacitracin-polymyxin (NEOSPORIN) [phone] ointment Apply 1 application topically 4 (four) times daily as needed (for cut/scrapes.).     NEOMYCIN -POLYMYXIN-HYDROCORTISONE (CORTISPORIN) 1 % SOLN OTIC solution Apply 1-2 drops to toe BID after soaking 10 mL 1   pantoprazole  (PROTONIX ) 40 MG tablet Take 1 tablet (40 mg total) by mouth 2 (two) times daily before a meal. For heartburn 180 tablet 3   polyvinyl alcohol  (LIQUIFILM TEARS) 1.4 % ophthalmic solution Place 2 drops into both eyes at bedtime.     risperiDONE (RISPERDAL) 0.25 MG tablet Take 0.25 mg by mouth daily.     Roflumilast  (ZORYVE ) 0.3 % CREA Apply 1 application  topically daily. qd to aa psoriasis on legs, arms 60 g 4   Ruxolitinib Phosphate  (OPZELURA ) 1.5 % CREA Apply to aa's rash QD PRN. 60 g 3   STIOLTO RESPIMAT  2.5-2.5 MCG/ACT AERS Inhale 2 puffs into the lungs at bedtime.     temazepam  (RESTORIL ) 30 MG capsule Take 2 capsules (60 mg total) by mouth at bedtime as needed for sleep. 60 capsule 4   traZODone  (DESYREL ) 100 MG tablet 3 qhs 90 tablet 4  valACYclovir  (VALTREX ) 1000 MG tablet Take 2  tablets by mouth twice daily for 1 day as needed for herpes outbreak. 12 tablet 0   Vitamin D , Ergocalciferol , (DRISDOL) 1.25 MG (50000 UNIT) CAPS capsule Take 50,000 Units by mouth every 7 (seven) days.     Vitamin D3 (VITAMIN D ) 25 MCG tablet Take 3,000 Units by mouth daily.     No facility-administered medications prior to visit.    LMP 08/23/2014   BP 100/60   Pulse 77   Temp 98.7 F (37.1 C)   Ht 5' 8 (1.727 m)   Wt 229 lb (103.9 kg)   LMP 08/23/2014   SpO2 96%   BMI 34.82 kg/m        Physical Examination:  General Appearance: No distress  EYES EOM intact.   NECK Supple, No JVD Pulmonary: normal breath sounds, No wheezing.  CardiovascularNormal S1,S2.  No m/r/g.   Ext pulses intact, cap refill intact  ALL OTHER ROS ARE NEGATIVE     ASSESSMENT AND PLAN 63 year old pleasant white female seen today for underlying obstructive sleep apnea in the setting of COPD with a history of multiple sclerosis   OSA (obstructive sleep apnea) Continue CPAP as prescribed  Excellent compliance report Reviewed compliance report in detail with patient Patient definitely benefits the use of CPAP therapy as prescribed Using CPAP nightly and with naps Pressure setting is comfortable and is sleeping well. CPAP prescription 10-20 AHI reduced to 0.3  No evidence of acute heart failure at this time No respiratory distress No fevers, chills, nausea, vomiting, diarrhea No evidence hemoptysis  Patient Instructions Continue to use CPAP every night, minimum of 4-6 hours a night.  Change equipment every 30 days or as directed by DME.  Wash your tubing with warm soap and water  daily, hang to dry.  Wash humidifier portion weekly. Use bottled, distilled water  and change daily  Risk of untreated sleep apnea including cardiac arrhthymias, stroke, DM, pulm HTN.   COPD stable Patient feels more short of breath however may not be related to COPD could be related to untreated multiple  sclerosis At this time I would recommend starting Trelegy inhaler Rinse mouth after use Use Xopenex  as needed Avoid Allergens and Irritants Avoid secondhand smoke Avoid SICK contacts Recommend  Masking  when appropriate Recommend Keep up-to-date with vaccinations  Obesity -recommend significant weight loss -recommend changing diet  Deconditioned state -Recommend increased daily activity and exercise     MEDICATION ADJUSTMENTS/LABS AND TESTS ORDERED: Continue CPAP Xopenex  as needed Start Trelegy Rinse mouth after every use    CURRENT MEDICATIONS REVIEWED AT LENGTH WITH PATIENT TODAY   Patient  satisfied with Plan of action and management. All questions answered   Follow up 1 year   I spent a total of 41 minutes dedicated to the care of this patient on the date of this encounter to include pre-visit review of records, face-to-face time with the patient discussing conditions above, post visit ordering of testing, clinical documentation with the electronic health record, making appropriate referrals as documented, and communicating necessary information to the patient's healthcare team.    The Patient requires high complexity decision making for assessment and support, frequent evaluation and titration of therapies, application of advanced monitoring technologies and extensive interpretation of multiple databases.  Patient satisfied with Plan of action and management. All questions answered    Nickolas Alm Cellar, M.D.  [facility] Pulmonary & Critical Care Medicine  Medical Director [facility]

## 2024-10-03 ENCOUNTER — Ambulatory Visit

## 2024-10-04 ENCOUNTER — Ambulatory Visit: Admitting: Occupational Therapy

## 2024-10-04 NOTE — Telephone Encounter (Signed)
 Patient Advocate Encounter   The patient was approved for a [employer] grant that will help cover the cost of repatha  Total amount awarded, 2500.  Effective: 11/05/24 - 11/04/25   APW:[id number] ERW:[id number] Hmnle:[id number] PI:[id number] [employer] ID: [id number]   Pharmacy provided with approval and processing information. Patient informed via [employer]ona [employer]  P: [phone] F: [phone]

## 2024-10-05 ENCOUNTER — Ambulatory Visit

## 2024-10-05 DIAGNOSIS — M6281 Muscle weakness (generalized): Secondary | ICD-10-CM

## 2024-10-05 DIAGNOSIS — G35D Multiple sclerosis, unspecified: Secondary | ICD-10-CM

## 2024-10-05 NOTE — Therapy (Signed)
 OUTPATIENT OCCUPATIONAL THERAPY NEURO TREATMENT  Patient Name: Emily Moon MRN: [id number] DOB:17-Oct-1961, 63 y.o., female Today's Date: 10/05/2024  PCP: Dr. Jacques Schroeder  REFERRING PROVIDER: Dr. Arthea Farrow   END OF SESSION:  OT End of Session - 10/05/24 1320     Visit Number 2    Number of Visits 24    Date for Recertification  12/20/24    OT Start Time 1320    OT Stop Time 1400    OT Time Calculation (min) 40 min    Activity Tolerance Patient tolerated treatment well    Behavior During Therapy [facility] for tasks assessed/performed         Past Medical History:  Diagnosis Date   Allergy 11-16-88   See med list on mychart.   Anxiety 11-16-89   Arthritis 11-17-87   Asthma 11-16-88   Bipolar disorder (HCC) 05/21/2014   Cataracts, bilateral    Chronic pain syndrome    COPD (chronic obstructive pulmonary disease) (HCC) 11-17-19   DDD (degenerative disc disease), cervical    also back   Depression 09-01 1990   Diabetes mellitus without complication (HCC) 11-16-2020   Emphysema of lung (HCC) 01-15-20   Fibromyalgia syndrome    GERD (gastroesophageal reflux disease) 11-16-92   Gout    Hip dysplasia, congenital 09/15/2013   History of kidney stones    Hx of sepsis    Hypercholesterolemia    IDA (iron  deficiency anemia)    Multiple sclerosis    weakness   Osteoporosis    osteoarthritis   Prediabetes 07/22/2018   Psoriasis    Seasonal allergies    Sleep apnea 2012   sleep study / slight, no interventions   Vitamin B 12 deficiency    Past Surgical History:  Procedure Laterality Date   CATARACT EXTRACTION W/PHACO Left 05/21/2015   Procedure: CATARACT EXTRACTION PHACO AND INTRAOCULAR LENS PLACEMENT (IOC);  Surgeon: Elsie Carmine, MD;  Location: [facility];  Service: Ophthalmology;  Laterality: Left;  US  00:35 AP% 22.9 CDE 8.11 fluid pack lot #[id number]   CATARACT EXTRACTION W/PHACO Right 06/04/2015   Procedure: CATARACT EXTRACTION PHACO AND INTRAOCULAR LENS PLACEMENT  (IOC);  Surgeon: Elsie Carmine, MD;  Location: [facility];  Service: Ophthalmology;  Laterality: Right;  US :00:48 AP%: 10.5 CDE:5.08 Fluid lot #[id number]   CYSTOSCOPY/URETEROSCOPY/HOLMIUM LASER/STENT PLACEMENT Bilateral 09/22/2016   Procedure: CYSTOSCOPY/URETEROSCOPY/HOLMIUM LASER/STENT PLACEMENT;  Surgeon: Rosina Riis, MD;  Location: [facility];  Service: Urology;  Laterality: Bilateral;   EYE SURGERY  2015 and 2016   tissue biopsy   FOOT SURGERY  11/16/2013   JOINT REPLACEMENT Left 2013 and 2022   hip replacement   LITHOTRIPSY     PTOSIS REPAIR Bilateral 02/18/2016   Procedure: BILATERAL PTOSIS REPAIR UPPER EYELIDS;  Surgeon: Greig CHRISTELLA Gay, MD;  Location: [facility];  Service: Ophthalmology;  Laterality: Bilateral;  LEAVE PT EARLY AM   thumb surgery Right    TONSILLECTOMY  11/17/1971   TOTAL HIP ARTHROPLASTY Right 07/02/2021   Procedure: TOTAL HIP ARTHROPLASTY ANTERIOR APPROACH;  Surgeon: Melodi Lerner, MD;  Location: [facility];  Service: Orthopedics;  Laterality: Right;   Patient Active Problem List   Diagnosis Date Noted   BMI 35.0-35.9,adult 08/03/2023   SVT (supraventricular tachycardia) 04/15/2023   Hypoglycemia associated with type 2 diabetes mellitus (HCC) 04/06/2023   Metabolic syndrome 04/06/2023   Chronic urticaria 12/03/2022   Angioedema 12/03/2022   Chronic pain of left ankle 04/08/2022   Fracture of tibial plateau 04/08/2022   Prolonged QT interval 03/23/2022  MS (multiple sclerosis) 02/05/2022   Memory loss or impairment 02/05/2022   Iron  deficiency anemia 12/25/2021   Myalgia due to statin 11/04/2021   Type 2 diabetes mellitus with hyperglycemia (HCC) 08/28/2020   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   Psoriasis 11/02/2019   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   Asthma 02/10/2019   Obstructive sleep apnea syndrome 12/02/2018   Environmental and seasonal allergies 10/24/2018   Vitamin D  deficiency 02/15/2018    Preventative health care 11/24/2017   B12 deficiency 05/22/2016   Cobalamin deficiency 05/22/2016   Chronic back pain 10/31/2014   Bipolar disorder (HCC) 05/21/2014   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   Insomnia 09/01/2011   HLD (hyperlipidemia) 01/20/2011   Chronic pain syndrome 01/07/2011   RENAL CALCULUS, RECURRENT 11/13/2010   Kidney stone 11/13/2010   Multiple sclerosis, progressive relapsing 08/28/2010   GERD 08/28/2010   NEPHROLITHIASIS, HX OF 08/28/2010   Gastroesophageal reflux disease 08/28/2010   History of urinary stone 08/28/2010   ONSET DATE: 1991  REFERRING DIAG: MS  THERAPY DIAG:  Multiple sclerosis  Muscle weakness (generalized)  Rationale for Evaluation and Treatment: Rehabilitation  SUBJECTIVE:  SUBJECTIVE STATEMENT: Pt reports that she was too tired to walk and took a transport chair to therapy today.  Pt accompanied by: self  PERTINENT HISTORY:   Pt reports she was diagnosed with MS in 1991, experienced worsening weakness in the arms and hands in 2009, which further worsened about 6 weeks ago.  Per chart from Dr. Lane on 09/14/24:  Ms. Angelucci is a 63 y.o. female presenting for evaluation of  MS/ WEAKNESS/ FATIGUE/ NUMBNESS/ MEMORY LOSS/ DEPRESSION/  - Worsening, again. - Patient reports no new MS flare-ups. Endorses worsening memory issues, including repeating questions and conversations, and struggling to recall familiar names, faces, and locations. Reports muscle spasms, numbness, and tingling in both lower extremities. Sleep is stable, and her mood is described as okay. Taking metformin  500 mg twice a day, Methocarbamol  500 mg in the morning and 750 nightly.  - Refer to Occupational Therapy for upper extremities weakness and incoordination.  - Reviewed [facility] evaluation for possible multiple sclerosis, ongoing.  - Recommend that patient brings in medical records from [facility] to facilitate ordering Lab tests as requested.  - Recommend patient to  apply for the charity care program through [facility] or [place], as patient reports being unable to afford medical care and requires extensive diagnostic work-up. - Continue taking Metformin  500 mg twice a day.  - Continue taking Methocarbamol  500 mg in the morning and 750 nightly.   PRECAUTIONS: Fall  WEIGHT BEARING RESTRICTIONS: No  PAIN:  Are you having pain? Yes: NPRS scale: 3-5/10  Pain location: neck (5) legs (3), low back (4), everything always hurts  Pain description: hamstrings feel like stretching and on fire, low back is aching Aggravating factors: cold weather, chronic pain d/t MS Relieving factors: medication  FALLS: Has patient fallen in last 6 months? No  LIVING ENVIRONMENT: Lives with: lives alone Lives in: ground level apartment Stairs: No Has following equipment at home: Single point cane, Environmental Consultant - 2 wheeled, Environmental Consultant - 4 wheeled, bed side commode, and Grab bars  PLOF: Needs assistance with homemaking.  Pt has 2 women who provide caregiver assistance on different week days.  Pt reports they assist with laundry, housekeeping, running errands.  Pt is indep with basic ADLs at baseline.  Caregivers are present M, W, Th, F between 2 and 5 hours per day.  PATIENT GOALS: To strengthen my hands and my arms.  OBJECTIVE:  Note: Objective measures were completed at Evaluation unless otherwise noted.  HAND DOMINANCE: Right  ADLs: Overall ADLs: Pt reports that she's been dropping ADL supplies more often over the last 6 weeks, including her phone Transfers/ambulation related to ADLs: 4 wheeled walker for community distances  Eating: difficulty manipulating eating utensils for cutting food Grooming: Pt acknowledges dropping toothpaste tube/cap UB Dressing: indep LB Dressing: indep  Toileting: increased effort d/t low toilet; pt reports she's trying to get a standard toilet from apt complex Bathing: Walkin shower; tries to shower when caregivers are present (distant supv) Tub  Shower transfers: distant supv-modified indep  IADLs: Shopping: caregivers manage at baseline Light housekeeping: caregivers manage at baseline Meal Prep: difficulty opening jars; pt reports that she orders out a lot Community mobility: modified indep with rollator Medication management: indep Financial management: indep Handwriting: 100% legible  LEISURE:  Pt is an tree surgeon, 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  Pt reports she can paint with water  colors on flat table top, as arms are supported.    POSTURE COMMENTS:  No Significant postural limitations  ACTIVITY TOLERANCE: Activity tolerance: To be further assessed within upcoming sessions  FUNCTIONAL OUTCOME MEASURES: MAM-20 for neurological conditions: TBD 10/05/24: MAM-20 for neurological conditions: 66.4  UPPER EXTREMITY ROM:  BUEs WFL  UPPER EXTREMITY MMT:     Active ROM Right eval Left eval  Shoulder flexion 4- 4-  Shoulder abduction 4- 4-  Shoulder adduction    Shoulder extension    Shoulder internal rotation 4- 4-  Shoulder external rotation 4- 4-  Elbow flexion 4 4  Elbow extension 4+ 4+  Wrist flexion 4 4  Wrist extension 4- 4-  Wrist ulnar deviation    Wrist radial deviation    Wrist pronation    Wrist supination    (Blank rows = not tested)  HAND FUNCTION: Grip strength: Right: 45 lbs; Left: 54 lbs, Lateral pinch: Right: 14 lbs, Left: 15 lbs, and 3 point pinch: Right: 13 lbs, Left: 14 lbs  COORDINATION: 9 Hole Peg test: Right: 24 sec; Left: 27 sec  SENSATION: Pt reports numbness in palms   EDEMA: No visible edema  MUSCLE TONE: RUE: Within functional limits and LUE: Within functional limits  COGNITION: Overall cognitive status: Impaired; pt reports worsening memory and word finding (SLP eval recommended)  VISION: Subjective report: reading glasses, hx of cataract sx in 2016; pt reports things are darker/dimmer in R eye, no reports of  diplopia   PERCEPTION: WFL  PRAXIS: WFL  OBSERVATIONS: Pt pleasant, cooperative, and reports eagerness to improve BUE strength and coordination.                                                                                                 TREATMENT DATE: 10/05/24:  Self Care: -Pt. worked on careers information officer and speed with regular pen compared to built up foam grip. Improved legibility 90% and line spacing with built up grip. 75% legibility standard pen. Issued red foam built  up grip.  -Educated on energy conservation strategies and routine modification.    Therapeutic Exercises: 1 set x 10 reps each with MIN-MOD cues for technique  Issued green theraputty and green theraband for HEP. - Seated Scapular Retraction   - Seated Elbow Flexion with Self-Anchored Resistance   - Seated Elbow Extension with Self-Anchored Resistance  - Seated Shoulder Horizontal Abduction with Resistance  - Seated Diagonal Reach with Resistance   - Putty Squeezes   - Key Pinch with Putty   - 3-Point Pinch with Putty   - Rolling Putty on Table   - Finger Pinch and Pull with Putty    PATIENT EDUCATION: Education details: OT role, goals, poc Person educated: Patient Education method: Explanation Education comprehension: verbalized understanding  Access Code: [email or web address] URL: [email or web address] Date: 10/05/2024  HOME EXERCISE PROGRAM: To be initiated in upcoming sessions  GOALS: Goals reviewed with patient? Yes  SHORT TERM GOALS: Target date: 11/08/24  Pt will be indep to perform HEP for improving BUE strength for daily tasks. Baseline: Eval: Not yet initiated Goal status: INITIAL  LONG TERM GOALS: Target date: 12/20/24  Pt will increase MAM-20 score for neurological conditions by (TBD) or more points to indicate improvement in self perceived functional use of the BUEs for daily tasks.  Baseline: Eval: TBD Goal status: INITIAL  2.  Pt will increase R grip strength by 15 or  more lbs, and L grip strength by 5 or more lbs to improve ability to open tight jars.  Baseline: R 45 lbs, L 54 lbs (non-dominant); difficulty opening jars for meal prep Goal status: INITIAL  3.  Pt will increase bilat shoulder flex/abd strength by 1 full muscle grade to improve tolerance for painting on a canvas. Baseline: Eval: R/L shoulder flex/abd 4-/5; pt reports inability to hold arms up to paint on a vertical canvas d/t weakness Goal status: INITIAL  4.  Pt will increase R/L lateral pinch strength by 3 or more lbs to ease ability to open food packages and grooming containers (ie toothpaste tubes/lotion).  Baseline: Eval: R 14 lbs, L 15 lbs  Goal status: INITIAL  ASSESSMENT:  CLINICAL IMPRESSION: Pt arrives in transport chair citing fatigue, continue to note word finding and short term memory deficits during session. Completed MAM-20 for neurologic conditions with score of 66.4 - answered very hard to write legibly and open jars. Issued green theraputty and green theraband for HEP, MIN-MOD cues for technique. Provided written handout and email access to videos for HEP. Continues to show deficits in BUE strength and tolerance for activity. Pt will benefit from skilled OT to address above noted deficits, working towards goals noted above in order to maximize indep with daily tasks and leisure activities, reduce burden of care on caregivers, and improve QOL.  PERFORMANCE DEFICITS: in functional skills including ADLs, IADLs, coordination, strength, pain, Fine motor control, mobility, balance, body mechanics, endurance, decreased knowledge of precautions, decreased knowledge of use of DME, and UE functional use, cognitive skills including memory and temperament/personality, and psychosocial skills including coping strategies, environmental adaptation, habits, and routines and behaviors.   IMPAIRMENTS: are limiting patient from ADLs, IADLs, and leisure.   CO-MORBIDITIES: has co-morbidities  such as bipolar, asthma, anxiety, depression, DM2, COPD that affects occupational performance. Patient will benefit from skilled OT to address above impairments and improve overall function.  MODIFICATION OR ASSISTANCE TO COMPLETE EVALUATION: No modification of tasks or assist necessary to complete an evaluation.  OT OCCUPATIONAL PROFILE AND HISTORY: Detailed assessment: Review of records  and additional review of physical, cognitive, psychosocial history related to current functional performance.  CLINICAL DECISION MAKING: Moderate - several treatment options, min-mod task modification necessary  REHAB POTENTIAL: Good  EVALUATION COMPLEXITY: Moderate    PLAN:  OT FREQUENCY: 2x/week  OT DURATION: 12 weeks  PLANNED INTERVENTIONS: 97168 OT Re-evaluation, 97535 self care/ADL training, 02889 therapeutic exercise, 97530 therapeutic activity, 97112 neuromuscular re-education, 97140 manual therapy, 97010 moist heat, 97010 cryotherapy, 97034 contrast bath, 97750 Physical Performance Testing, psychosocial skills training, energy conservation, coping strategies training, patient/family education, and DME and/or AE instructions  RECOMMENDED OTHER SERVICES: Recommendation for SLP eval d/t reported worsening memory and word finding  CONSULTED AND AGREED WITH PLAN OF CARE: Patient  PLAN FOR NEXT SESSION: Initiate HEP; complete MAM-20 for neurological conditions  Elston Slot, M.S. OTR/L  10/05/24, 1:21 PM  ascom [phone]   Elston JINNY Slot, OT 10/05/2024, 1:21 PM

## 2024-10-06 NOTE — Telephone Encounter (Signed)
 Received pt portion [employer]  along proof of income,faxing provider portion.

## 2024-10-08 ENCOUNTER — Encounter: Payer: Self-pay | Admitting: Family Medicine

## 2024-10-08 NOTE — Progress Notes (Unsigned)
 [name], MD, CAQ [facility] [address] [place], [address]  Phone: [phone]  FAX: [phone]  Emily Moon - 63 y.o. female  MRN [id number]  Date of Birth: 1961-03-07  Date: 10/09/2024  PCP: Watt Mirza, MD  Referral: No ref. provider found  Chief Complaint  Patient presents with   Establish Care    TOC from Dr. Jimmy   Subjective:   Emily Moon is a 63 y.o. very pleasant female patient with Body mass index is 34.06 kg/m. who presents with the following:  Discussed the use of AI scribe software for clinical note transcription with the patient, who gave verbal consent to proceed.  Timiko is a patient who I have known for many years.  She is a former Dr. Jenetta patient, and she has most recently been seeing Dr. Jimmy until he retired.  History is significant for multiple sclerosis.  She has been seeing Dr. Lane for a long time.  She also has bipolar illness that has been stable for an extended period of time. - Trazodone  100 mg, Restoril  30 mg, Risperdal 0.25 mg, Lamictal  100 mg p.o. twice daily, hydroxyzine  50 mg p.o. as needed, Cymbalta  20 mg p.o. twice daily.  Desipramine  25 mg p.o. twice daily. - She sees Dr. Tasia.  COPD, she currently takes Trelegy and sees pulmonology.  Dr. Isaiah  Diabetes, currently on Trulicity  3 mg. History of Present Illness Emily Moon is a 63 year old female with multiple chronic conditions who presents for a follow-up visit.  She has experienced a significant increase in skin issues, including red lesions and flatter pink lesions. She uses clobetasol  ointment, though not as frequently as prescribed. Additionally, she has a yeast infection for which she takes fluconazole  once a week for four weeks.  She has a history of multiple sclerosis and is currently undergoing evaluation at [facility]. She uses a walker 99% of the time, requires help at home,  and cannot stand for more than a minute. She has difficulty driving due to weakness in her arms and is attending physical and occupational therapy.  She experiences significant muscle pain, particularly in her thighs, described as 'stretching the muscle over hot flames.' She uses THC gummies for pain management, taking 150 mg divided into four doses, prefers to traditional pain medications.  She has diabetes, managed with Trulicity  and metformin , taking two metformin  tablets before her one meal a day. Her blood sugar is well-controlled with an A1c of 6.0%.  She has high cholesterol, managed with Tricor  and Repatha . Her cholesterol levels have decreased from 367 to 115 total.  She reports hair loss, attributing it to medication use, and has been using biotin  for MS and hair health.  She has bipolar disorder, managed with Risperdal, Lamictal , hydroxyzine , Cymbalta , desipramine , trazodone , and temazepam . She sees Dr. Tasia and a counselor regularly.  She experiences urinary issues, particularly difficulty urinating, which she attributes to the effects of Trulicity  and other medications, describing having to force urination by straining.  She reports significant fatigue, memory issues, and speech difficulties, attributing these to her MS and other conditions. She also experiences blurred vision and has had cataract surgery.  She has spinal stenosis and reports chronic pain in her shoulder and hip, which she manages with methocarbamol  and metoprolol . She also uses a CPAP machine for sleep apnea.  She has allergies and reflux, managed with pantoprazole , montelukast , and Zyrtec, and takes a baby  aspirin  daily.  She reports a significant impact on her quality of life, feeling 'crappy about life' and experiencing symptoms of depression and anxiety. She has a history of PTSD related to her sister's death.  Review of Systems is noted in the HPI, as appropriate  Patient Active Problem List   Diagnosis  Date Noted   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    Priority: High   Bipolar disorder (HCC) 05/21/2014    Priority: High   Multiple sclerosis, progressive relapsing 08/28/2010    Priority: High   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    Priority: Medium    HLD (hyperlipidemia) 01/20/2011    Priority: Medium    Chronic pain syndrome 01/07/2011    Priority: Medium    Myalgia due to statin 11/04/2021    Priority: Low   Psoriasis 11/02/2019    Priority: Low   Obstructive sleep apnea syndrome 12/02/2018    Priority: Low   Environmental and seasonal allergies 10/24/2018    Priority: Low   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    Priority: Low   Insomnia 09/01/2011    Priority: Low   NEPHROLITHIASIS, HX OF 08/28/2010    Priority: Low   Gastroesophageal reflux disease 08/28/2010    Priority: Low   History of urinary stone 08/28/2010    Priority: Low   SVT (supraventricular tachycardia) 04/15/2023   Angioedema 12/03/2022   Prolonged QT interval 03/23/2022   Iron  deficiency anemia 12/25/2021   Vitamin D  deficiency 02/15/2018   B12 deficiency 05/22/2016   Chronic back pain greater than 3 months duration 10/31/2014    Past Medical History:  Diagnosis Date   Anxiety 11-16-89   Asthma 11-16-88   Bipolar disorder (HCC) 05/21/2014   Chronic pain syndrome 01/07/2011   COPD (chronic obstructive pulmonary disease) (HCC) 11-17-19   Diabetes mellitus without complication (HCC) 11-16-2020   Fibromyalgia syndrome    GERD (gastroesophageal reflux disease) 11-16-92   Gout    Hip dysplasia, congenital 09/15/2013   History of kidney stones    Hypercholesterolemia    Multiple sclerosis    Osteoporosis    Psoriasis    Seasonal allergies    Sleep apnea 2012   sleep study / slight, no interventions   Vitamin B 12 deficiency     Past Surgical History:  Procedure Laterality Date   CATARACT EXTRACTION W/PHACO Left 05/21/2015   Procedure: CATARACT EXTRACTION  PHACO AND INTRAOCULAR LENS PLACEMENT (IOC);  Surgeon: Elsie Carmine, MD;  Location: [facility];  Service: Ophthalmology;  Laterality: Left;  US  00:35 AP% 22.9 CDE 8.11 fluid pack lot #[id number]   CATARACT EXTRACTION W/PHACO Right 06/04/2015   Procedure: CATARACT EXTRACTION PHACO AND INTRAOCULAR LENS PLACEMENT (IOC);  Surgeon: Elsie Carmine, MD;  Location: [facility];  Service: Ophthalmology;  Laterality: Right;  US :00:48 AP%: 10.5 CDE:5.08 Fluid lot #[id number]   CYSTOSCOPY/URETEROSCOPY/HOLMIUM LASER/STENT PLACEMENT Bilateral 09/22/2016   Procedure: CYSTOSCOPY/URETEROSCOPY/HOLMIUM LASER/STENT PLACEMENT;  Surgeon: Rosina Riis, MD;  Location: [facility];  Service: Urology;  Laterality: Bilateral;   EYE SURGERY  2015 and 2016   tissue biopsy   FOOT SURGERY  11/16/2013   JOINT REPLACEMENT Left 2013 and 2022   hip replacement   LITHOTRIPSY     PTOSIS REPAIR Bilateral 02/18/2016   Procedure: BILATERAL PTOSIS REPAIR UPPER EYELIDS;  Surgeon: Greig CHRISTELLA Gay, MD;  Location: [facility];  Service: Ophthalmology;  Laterality: Bilateral;  LEAVE PT EARLY AM   thumb surgery Right    TONSILLECTOMY  11/17/1971  TOTAL HIP ARTHROPLASTY Right 07/02/2021   Procedure: TOTAL HIP ARTHROPLASTY ANTERIOR APPROACH;  Surgeon: Melodi Lerner, MD;  Location: [facility];  Service: Orthopedics;  Laterality: Right;    Family History  Problem Relation Age of Onset   Cancer Mother    Heart disease Mother    Diabetes Mother    Sleep apnea Mother    Hyperlipidemia Mother    Cancer Father        Abdomen with mastasis   High Cholesterol Father    Early death Father    Heart disease Maternal Grandfather    Diabetes Maternal Grandmother    Cancer Paternal Grandmother    Early death Paternal Grandmother    Arthritis Sister    COPD Sister    Early death Sister    Heart disease Maternal Uncle    Kidney disease Neg Hx    Bladder Cancer Neg Hx    Prostate cancer Neg Hx    Kidney cancer Neg Hx     Social  History   Social History Narrative   Not on file     Objective:   BP 90/64   Pulse 84   Temp 98.8 F (37.1 C) (Oral)   Ht 5' 8 (1.727 m)   Wt 224 lb (101.6 kg)   LMP 08/23/2014   SpO2 98%   BMI 34.06 kg/m   GEN: No acute distress; alert,appropriate. CV: RRR, no m/g/r  PULM: Normal respiratory rate, no accessory muscle use. No wheezes, crackles or rhonchi  PSYCH: Normally interactive.   Laboratory and Imaging Data: Lab Review:     Latest Ref Rng & Units 04/03/2024    3:59 PM 06/16/2023    3:01 PM 12/15/2022    3:14 PM  CBC EXTENDED  WBC 4.0 - 10.5 K/uL 5.6  8.0  7.5   RBC 3.87 - 5.11 Mil/uL 4.08  4.37  4.30   Hemoglobin 12.0 - 15.0 g/dL 87.5  87.0  86.6   HCT 36.0 - 46.0 % 37.4  39.4  39.9   Platelets 150.0 - 400.0 K/uL 405.0  436  434   NEUT# 1.7 - 7.7 K/uL  5.1  4.8   Lymph# 0.7 - 4.0 K/uL  1.9  1.6        Latest Ref Rng & Units 08/22/2024    4:25 PM 04/03/2024    3:59 PM 06/16/2023    3:01 PM  BMP  Glucose 70 - 99 mg/dL 894  882  870   BUN 8 - 27 mg/dL 14  20  25    Creatinine 0.57 - 1.00 mg/dL 9.12  9.08  9.02   BUN/Creat Ratio 12 - 28 16     Sodium 134 - 144 mmol/L 140  141  136   Potassium 3.5 - 5.2 mmol/L 4.4  4.3  4.0   Chloride 96 - 106 mmol/L 101  102  99   CO2 20 - 29 mmol/L 26  27  26    Calcium  8.7 - 10.3 mg/dL 9.2  9.0  9.3        Latest Ref Rng & Units 04/03/2024    3:59 PM 08/27/2023   11:59 AM 06/16/2023    3:01 PM  Hepatic Function  Total Protein 6.0 - 8.3 g/dL 6.4   7.3   Albumin 3.5 - 5.2 g/dL 4.3  4.2  4.3   AST 0 - 37 U/L 10   15   ALT 0 - 35 U/L 9   15   Alk Phosphatase 39 -  117 U/L 28   37   Total Bilirubin 0.2 - 1.2 mg/dL 0.4   0.5     Lab Results  Component Value Date   CHOL 115 04/03/2024   Lab Results  Component Value Date   HDL 35.80 (L) 04/03/2024   Lab Results  Component Value Date   LDLCALC 41 04/03/2024   Lab Results  Component Value Date   TRIG 188.0 (H) 04/03/2024   Lab Results  Component Value Date    CHOLHDL 3 04/03/2024   No results for input(s): PSA in the last 72 hours. Lab Results  Component Value Date   HCVAB NEGATIVE 12/17/2016   Lab Results  Component Value Date   VD25OH 95.8 03/23/2023   VD25OH 75.66 01/22/2020   VD25OH 61.68 08/15/2019     Lab Results  Component Value Date   HGBA1C 6.0 (A) 10/09/2024   HGBA1C 6.1 04/03/2024   HGBA1C 6.6 (H) 03/23/2023   Lab Results  Component Value Date   LDLCALC 41 04/03/2024   CREATININE 0.87 08/22/2024     Assessment and Plan:     ICD-10-CM   1. Type 2 diabetes mellitus with other circulatory complications (HCC)  E11.59 POCT glycosylated hemoglobin (Hb A1C)    Microalbumin / creatinine urine ratio    2. Multiple sclerosis, progressive relapsing  G35.D     3. Bipolar affective disorder in remission  F31.70     4. Mixed hyperlipidemia  E78.2     5. COPD with asthma (HCC)  J44.89     6. Psoriasis  L40.9     7. Myalgia due to statin  M79.10    T46.6X5A     8. Primary insomnia  F51.01     9. History of urinary stone  Z87.442     10. Gastroesophageal reflux disease without esophagitis  K21.9     11. Environmental and seasonal allergies  J30.89      Total encounter time: 44 minutes. This includes total time spent on the day of encounter.  Complex medical problem review, face-to-face discussion for the entirety of this 44-minute encounter. Assessment & Plan Psoriasis and chronic skin changes Multiple red lesions, likely hemangiomas, with some additional spots resembling psoriatic plaques. Dermatologist uncertain of diagnosis. - Continue current steroid ointment.  Hair loss Significant hair thinning. Discussed potential use of topical Rogaine for hair regrowth. - Consider using topical Rogaine for hair regrowth.  Multiple sclerosis with progressive neurological symptoms Progressive neurological symptoms with MRI findings suggestive of MS. Neurology follow-up ongoing with additional tests ordered. - Continue  physical and occupational therapy. - Follow up with neurology for ongoing evaluation and management.  Bipolar disorder, currently in remission Bipolar disorder in remission. Current medications include Risperdal, Lamictal , hydroxyzine , Cymbalta , desipramine , trazodone , and temazepam . - Continue current psychiatric medications. - Maintain regular follow-up with mental health professionals.  Chronic pain (myalgia and chronic joint pain) Chronic pain in muscles and joints, exacerbated at night. Current management includes methocarbamol . - Continue methocarbamol  for pain management.  Type 2 diabetes mellitus, well controlled Type 2 diabetes well controlled with Trulicity  and metformin . A1c is 6.0.Emily Moon - Continue current diabetes management regimen.  Mixed hyperlipidemia, improved with therapy Mixed hyperlipidemia significantly improved with fenofibrate  and Repatha . - Continue current lipid-lowering therapy.  Chronic obstructive pulmonary disease Patient is taking Trelegy and montelukast .  Primary insomnia Chronic insomnia managed with temazepam  and trazodone . - Continue current sleep medications.  Gastroesophageal reflux disease GERD managed with pantoprazole . - Continue pantoprazole  for GERD management.  Allergic rhinitis  Managed with cetirizine and azelastine  nasal spray. - Continue current allergy medications.  Urinary symptoms (difficulty voiding, frequency) Urinary symptoms possibly related to medication side effects. Discussed potential bladder spasm.  Medication Management during today's office visit: No orders of the defined types were placed in this encounter.  Medications Discontinued During This Encounter  Medication Reason   Lysine  500 MG TABS Completed Course   B Complex-C-Zn-Folic Acid (DIALYVITE/ZINC) TABS Completed Course   Vitamin D , Ergocalciferol , (DRISDOL) 1.25 MG (50000 UNIT) CAPS capsule Completed Course   risperiDONE (RISPERDAL) 0.25 MG tablet Completed  Course   Fluticasone -Umeclidin-Vilant (TRELEGY ELLIPTA ) 200-62.5-25 MCG/ACT AEPB Completed Course   Vitamin D3 (VITAMIN D ) 25 MCG tablet    methocarbamol  (ROBAXIN ) 500 MG tablet Dose change    Orders placed today for conditions managed today: Orders Placed This Encounter  Procedures   POCT glycosylated hemoglobin (Hb A1C)    Disposition: Return in about 6 months (around 04/08/2025) for Dr. Watt, long-term medical follow-up.  [employer] One speech-to-text software was used for transcription in this dictation.  Possible transcriptional errors can occur using Animal nutritionist.   Signed,  Jacques DASEN. Janequa Kipnis, MD   Outpatient Encounter Medications as of 10/09/2024  Medication Sig   aspirin  EC 81 MG tablet Take 81 mg by mouth daily. Swallow whole.   Azelastine  HCl 137 MCG/SPRAY SOLN PLACE 2 SPRAYS INTO BOTH NOSTRILS 2 (TWO) TIMES DAILY   B Complex-Biotin -FA (BIG 100, BIOTIN , PO) Take 300 mg by mouth at bedtime.   BD INTEGRA SYRINGE 25G X 1 3 ML MISC SMARTSIG:1 Syringe(s) Once a Month   Biotin  5 MG TBDP Take 200 mg by mouth.   bisacodyl  (DULCOLAX) 5 MG EC tablet Take 5 mg by mouth daily as needed for moderate constipation.   cetirizine (ZYRTEC) 10 MG tablet Take 10 mg by mouth daily.   Cholecalciferol  (VITAMIN D3) 250 MCG (10000 UT) capsule Take 3 capsules by mouth at bedtime.   clindamycin  (CLEOCIN  T) 1 % external solution Apply to scalp once or twice a day prn bumps   clobetasol  cream (TEMOVATE ) 0.05 % Apply 1 Application topically 2 (two) times daily. Apply to aa QD-BID PRN up to two weeks. Then up to 5 days per week as needed. Avoid applying to face, groin, and axilla. Use as directed. Long-term use can cause thinning of the skin.   Continuous Glucose Receiver (FREESTYLE LIBRE 3 READER) DEVI as directed.   Continuous Glucose Sensor (FREESTYLE LIBRE 3 SENSOR) MISC PLACE 1 SENSOR ON THE SKIN EVERY 14 DAYS. USE TO CHECK GLUCOSE CONTINUOUSLY   cyanocobalamin  (VITAMIN B12) 1000 MCG/ML  injection INJECT 1 ML (1,000 MCG TOTAL) INTO THE MUSCLE EVERY 30 (THIRTY) DAYS. FOR B12 VITAMIN   desipramine  (NORPRAMIN ) 25 MG tablet 2  qam   diphenhydrAMINE  (BENADRYL ) 2 % cream Apply topically 3 (three) times daily as needed for itching.   Dulaglutide  (TRULICITY ) 3 MG/0.5ML SOAJ Inject 3 mL sq once weekly.   DULoxetine  (CYMBALTA ) 20 MG capsule Take 2 capsules (40 mg total) by mouth daily.   estradiol  (ESTRACE ) 0.1 MG/GM vaginal cream Estrogen Cream Instruction Discard applicator Apply pea sized amount to tip of finger to urethra before bed. Wash hands well after application. Use Monday, Wednesday and Friday   Evolocumab  (REPATHA  SURECLICK) 140 MG/ML SOAJ INJECT 140 MG INTO THE SKIN EVERY 14 (FOURTEEN) DAYS. CALL TO SCHEDULE APPT PRIOR TO NEXT REFILL   fenofibrate  (TRICOR ) 145 MG tablet TAKE 1 TABLET BY MOUTH DAILY FOR CHOLESTEROL *NEED APPOINTMENT   Fluticasone -Umeclidin-Vilant (TRELEGY  ELLIPTA) 200-62.5-25 MCG/ACT AEPB Inhale 1 puff into the lungs daily.   hydrOXYzine  (VISTARIL ) 50 MG capsule Take 2 capsules (100 mg total) by mouth at bedtime. TAKE 1 CAPSLE BY MOUTH AT NOON, 1 AT 6 PM, AND 1 AS NEEDED   ibuprofen  (ADVIL ) 200 MG tablet Take 800 mg by mouth 2 (two) times daily.   ketoconazole  (NIZORAL ) 2 % shampoo Apply 1 Application topically as directed. Wash from waist up 3 times a week for 2 months, then decrease to 1 time a month, let sit 5 minutes and rinse off   L-Lysine  HCl 500 MG TABS Take 1,000 mg by mouth.   lamoTRIgine  (LAMICTAL ) 150 MG tablet Take 2 tablets (300 mg total) by mouth daily at bedtime.   levalbuterol  (XOPENEX  HFA) 45 MCG/ACT inhaler Inhale 2 puffs into the lungs every 4 (four) hours as needed for wheezing or shortness of breath (or cough).   lidocaine  (LMX) 4 % cream Apply 1 Application topically daily as needed.   metFORMIN  (GLUCOPHAGE ) 500 MG tablet TAKE 2 TABS BY MOUTH EVERY MORNING AND 1 TAB IN THE EVENING   methocarbamol  (ROBAXIN ) 500 MG tablet Take 1,500 mg by  mouth at bedtime.   metoprolol  tartrate (LOPRESSOR ) 50 MG tablet TAKE 1 TABLET BY MOUTH TWICE A DAY   montelukast  (SINGULAIR ) 10 MG tablet TAKE 1 TABLET BY MOUTH EVERYDAY AT BEDTIME   mupirocin  ointment (BACTROBAN ) 2 % Apply 1 Application topically daily. Qd to wound on finger   neomycin -bacitracin-polymyxin (NEOSPORIN) [phone] ointment Apply 1 application topically 4 (four) times daily as needed (for cut/scrapes.).   NEOMYCIN -POLYMYXIN-HYDROCORTISONE (CORTISPORIN) 1 % SOLN OTIC solution Apply 1-2 drops to toe BID after soaking   pantoprazole  (PROTONIX ) 40 MG tablet Take 1 tablet (40 mg total) by mouth 2 (two) times daily before a meal. For heartburn   polyvinyl alcohol  (LIQUIFILM TEARS) 1.4 % ophthalmic solution Place 2 drops into both eyes at bedtime.   Roflumilast  (ZORYVE ) 0.3 % CREA Apply 1 application  topically daily. qd to aa psoriasis on legs, arms   Ruxolitinib Phosphate  (OPZELURA ) 1.5 % CREA Apply to aa's rash QD PRN.   STIOLTO RESPIMAT  2.5-2.5 MCG/ACT AERS Inhale 2 puffs into the lungs at bedtime.   temazepam  (RESTORIL ) 30 MG capsule Take 2 capsules (60 mg total) by mouth at bedtime as needed for sleep.   traZODone  (DESYREL ) 100 MG tablet 3 qhs   valACYclovir  (VALTREX ) 1000 MG tablet Take 2 tablets by mouth twice daily for 1 day as needed for herpes outbreak.   [DISCONTINUED] B Complex-C-Zn-Folic Acid (DIALYVITE/ZINC) TABS Take 300 mg by mouth.   [DISCONTINUED] Fluticasone -Umeclidin-Vilant (TRELEGY ELLIPTA ) 200-62.5-25 MCG/ACT AEPB Inhale 1 Act into the lungs daily.   [DISCONTINUED] Lysine  500 MG TABS Take 1,000 mg by mouth daily.   [DISCONTINUED] methocarbamol  (ROBAXIN ) 500 MG tablet TAKE 1-2 TABLETS (500-1,000 MG TOTAL) BY MOUTH AT BEDTIME AS NEEDED FOR MUSCLE SPASMS.   [DISCONTINUED] risperiDONE (RISPERDAL) 0.25 MG tablet Take 0.25 mg by mouth daily.   [DISCONTINUED] Vitamin D , Ergocalciferol , (DRISDOL) 1.25 MG (50000 UNIT) CAPS capsule Take 50,000 Units by mouth every 7 (seven)  days.   [DISCONTINUED] Vitamin D3 (VITAMIN D ) 25 MCG tablet Take 3,000 Units by mouth daily.   No facility-administered encounter medications on file as of 10/09/2024.

## 2024-10-09 ENCOUNTER — Encounter: Payer: Self-pay | Admitting: Family Medicine

## 2024-10-09 ENCOUNTER — Ambulatory Visit: Admitting: Family Medicine

## 2024-10-09 ENCOUNTER — Ambulatory Visit

## 2024-10-09 VITALS — BP 90/64 | HR 84 | Temp 98.8°F | Ht 68.0 in | Wt 224.0 lb

## 2024-10-09 DIAGNOSIS — F5101 Primary insomnia: Secondary | ICD-10-CM

## 2024-10-09 DIAGNOSIS — M2550 Pain in unspecified joint: Secondary | ICD-10-CM

## 2024-10-09 DIAGNOSIS — T466X5A Adverse effect of antihyperlipidemic and antiarteriosclerotic drugs, initial encounter: Secondary | ICD-10-CM

## 2024-10-09 DIAGNOSIS — F3170 Bipolar disorder, currently in remission, most recent episode unspecified: Secondary | ICD-10-CM

## 2024-10-09 DIAGNOSIS — Z87442 Personal history of urinary calculi: Secondary | ICD-10-CM | POA: Diagnosis not present

## 2024-10-09 DIAGNOSIS — E1159 Type 2 diabetes mellitus with other circulatory complications: Secondary | ICD-10-CM

## 2024-10-09 DIAGNOSIS — K219 Gastro-esophageal reflux disease without esophagitis: Secondary | ICD-10-CM | POA: Diagnosis not present

## 2024-10-09 DIAGNOSIS — M545 Low back pain, unspecified: Secondary | ICD-10-CM

## 2024-10-09 DIAGNOSIS — M791 Myalgia, unspecified site: Secondary | ICD-10-CM | POA: Diagnosis not present

## 2024-10-09 DIAGNOSIS — J4489 Other specified chronic obstructive pulmonary disease: Secondary | ICD-10-CM | POA: Diagnosis not present

## 2024-10-09 DIAGNOSIS — L409 Psoriasis, unspecified: Secondary | ICD-10-CM | POA: Diagnosis not present

## 2024-10-09 DIAGNOSIS — J3089 Other allergic rhinitis: Secondary | ICD-10-CM | POA: Diagnosis not present

## 2024-10-09 DIAGNOSIS — G35D Multiple sclerosis, unspecified: Secondary | ICD-10-CM

## 2024-10-09 DIAGNOSIS — E782 Mixed hyperlipidemia: Secondary | ICD-10-CM | POA: Diagnosis not present

## 2024-10-09 LAB — POCT GLYCOSYLATED HEMOGLOBIN (HGB A1C): Hemoglobin A1C: 6 % — AB (ref 4.0–5.6)

## 2024-10-10 ENCOUNTER — Ambulatory Visit: Admitting: Occupational Therapy

## 2024-10-10 ENCOUNTER — Encounter: Payer: Self-pay | Admitting: Family Medicine

## 2024-10-10 LAB — MICROALBUMIN / CREATININE URINE RATIO
Creatinine,U: 169.8 mg/dL
Microalb Creat Ratio: 8 mg/g (ref 0.0–30.0)
Microalb, Ur: 1.4 mg/dL (ref 0.0–1.9)

## 2024-10-13 NOTE — Telephone Encounter (Signed)
 Received provider portion pap [facility] (Trulicity ) faxed to [facility] along pt portion ,proof of income and Ins card.

## 2024-10-16 ENCOUNTER — Ambulatory Visit: Admitting: Speech Pathology

## 2024-10-16 ENCOUNTER — Ambulatory Visit

## 2024-10-18 ENCOUNTER — Ambulatory Visit: Admitting: Speech Pathology

## 2024-10-18 ENCOUNTER — Ambulatory Visit: Admitting: Occupational Therapy

## 2024-10-18 DIAGNOSIS — R278 Other lack of coordination: Secondary | ICD-10-CM | POA: Insufficient documentation

## 2024-10-18 DIAGNOSIS — G35D Multiple sclerosis, unspecified: Secondary | ICD-10-CM | POA: Diagnosis present

## 2024-10-18 DIAGNOSIS — M6281 Muscle weakness (generalized): Secondary | ICD-10-CM | POA: Insufficient documentation

## 2024-10-18 DIAGNOSIS — R41841 Cognitive communication deficit: Secondary | ICD-10-CM | POA: Insufficient documentation

## 2024-10-18 NOTE — Therapy (Unsigned)
 OUTPATIENT SPEECH LANGUAGE PATHOLOGY EVALUATION   Patient Name: Emily Moon MRN: [id number] DOB:1960/12/12, 63 y.o., female Today's Date: 10/18/2024  PCP: Jacques Schroeder, MD REFERRING PROVIDER: Arthea Farrow, MD   End of Session - 10/18/24 1711     Visit Number 1    Number of Visits 1    SLP Start Time 1405    SLP Stop Time  1435    SLP Time Calculation (min) 30 min    Activity Tolerance Patient tolerated treatment well          Past Medical History:  Diagnosis Date   Anxiety 11-16-89   Asthma 11-16-88   Bipolar disorder (HCC) 05/21/2014   Chronic pain syndrome 01/07/2011   COPD (chronic obstructive pulmonary disease) (HCC) 11-17-19   Diabetes mellitus without complication (HCC) 11-16-2020   Fibromyalgia syndrome    GERD (gastroesophageal reflux disease) 11-16-92   Gout    Hip dysplasia, congenital 09/15/2013   History of kidney stones    Hypercholesterolemia    Multiple sclerosis    Osteoporosis    Psoriasis    Seasonal allergies    Sleep apnea 2012   sleep study / slight, no interventions   Vitamin B 12 deficiency    Past Surgical History:  Procedure Laterality Date   CATARACT EXTRACTION W/PHACO Left 05/21/2015   Procedure: CATARACT EXTRACTION PHACO AND INTRAOCULAR LENS PLACEMENT (IOC);  Surgeon: Elsie Carmine, MD;  Location: [facility];  Service: Ophthalmology;  Laterality: Left;  US  00:35 AP% 22.9 CDE 8.11 fluid pack lot #[id number]   CATARACT EXTRACTION W/PHACO Right 06/04/2015   Procedure: CATARACT EXTRACTION PHACO AND INTRAOCULAR LENS PLACEMENT (IOC);  Surgeon: Elsie Carmine, MD;  Location: [facility];  Service: Ophthalmology;  Laterality: Right;  US :00:48 AP%: 10.5 CDE:5.08 Fluid lot #[id number] H   CYSTOSCOPY/URETEROSCOPY/HOLMIUM LASER/STENT PLACEMENT Bilateral 09/22/2016   Procedure: CYSTOSCOPY/URETEROSCOPY/HOLMIUM LASER/STENT PLACEMENT;  Surgeon: Rosina Riis, MD;  Location: [facility];  Service: Urology;  Laterality: Bilateral;   EYE SURGERY  2015 and  2016   tissue biopsy   FOOT SURGERY  11/16/2013   JOINT REPLACEMENT Left 2013 and 2022   hip replacement   LITHOTRIPSY     PTOSIS REPAIR Bilateral 02/18/2016   Procedure: BILATERAL PTOSIS REPAIR UPPER EYELIDS;  Surgeon: Greig CHRISTELLA Gay, MD;  Location: [facility];  Service: Ophthalmology;  Laterality: Bilateral;  LEAVE PT EARLY AM   thumb surgery Right    TONSILLECTOMY  11/17/1971   TOTAL HIP ARTHROPLASTY Right 07/02/2021   Procedure: TOTAL HIP ARTHROPLASTY ANTERIOR APPROACH;  Surgeon: Melodi Lerner, MD;  Location: [facility];  Service: Orthopedics;  Laterality: Right;   Patient Active Problem List   Diagnosis Date Noted   SVT (supraventricular tachycardia) 04/15/2023   Angioedema 12/03/2022   Prolonged QT interval 03/23/2022   Iron  deficiency anemia 12/25/2021   Myalgia due to statin 11/04/2021   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   Psoriasis 11/02/2019   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   Obstructive sleep apnea syndrome 12/02/2018   Environmental and seasonal allergies 10/24/2018   Vitamin D  deficiency 02/15/2018   B12 deficiency 05/22/2016   Chronic back pain greater than 3 months duration 10/31/2014   Bipolar disorder (HCC) 05/21/2014   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   Insomnia 09/01/2011   HLD (hyperlipidemia) 01/20/2011   Chronic pain syndrome 01/07/2011   Multiple sclerosis, progressive relapsing 08/28/2010   NEPHROLITHIASIS, HX OF 08/28/2010   Gastroesophageal reflux disease 08/28/2010   History of urinary stone 08/28/2010    ONSET  DATE: date of referral  10/05/2024  REFERRING DIAG:  G35.D (ICD-10-CM) - MS (multiple sclerosis)  R41.841 (ICD-10-CM) - Cognitive communication deficit    THERAPY DIAG:  Cognitive communication deficit  Rationale for Evaluation and Treatment Rehabilitation  SUBJECTIVE:   SUBJECTIVE STATEMENT: Pt pleasant, tangential, off topic, pressed speech Pt accompanied by:  self  PERTINENT HISTORY: Pt is a 63 year old female with Bipolar I disorder, severe, current or most recent episode depressed, with psychotic features, 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     PAIN:  Are you having pain? No   FALLS: Has patient fallen in last 6 months?  See PT evaluation for details  LIVING ENVIRONMENT: Lives with: lives alone and has paid in home caregivers intermittently  PLOF:  Level of assistance: Needed assistance with ADLs, Needed assistance with IADLS Employment: On disability   PATIENT GOALS   to improve speech  OBJECTIVE:   COGNITIVE COMMUNICATION Overall cognitive status: No family/caregiver present to determine baseline cognitive functioning  AUDITORY COMPREHENSION  Overall auditory comprehension: Appears intact  EXPRESSION: verbal  VERBAL EXPRESSION:   Overall verbal expression: Appears intact Level of generative/spontaneous verbalization: conversation Automatic speech: name: intact and social response: intact  Pragmatics: Impaired: eye contact, interpretation of nonverbal communication, topic appropriateness, topic maintenance, and turn taking Comments: see impressions statement Effective technique: cognitive linguistic strategies were not effective in redirecting pt's attention or hyperverbal Non-verbal means of communication: N/A  ORAL MOTOR EXAMINATION Facial : WFL Lingual: WFL Velum: WFL Mandible: WFL Cough: WFL Voice: WFL  MOTOR SPEECH: Overall motor speech: Appears intact Phonation: normal Resonance: WFL Articulation: Appears intact Intelligibility: Intelligible Motor planning: Appears intact Motor speech errors:    TODAY'S TREATMENT:  Education provided on the results of this evaluation, ST scope of practice, great speech intelligibility   PATIENT EDUCATION: Education details: it appears that pt's counselor is currently targeting many of pt's current complaints Person educated:  Patient Education method: Explanation Education comprehension: verbalized understanding    ASSESSMENT:  CLINICAL IMPRESSION: Patient is a 62 y.o. female who was seen today for a cognitive communication evaluation d/t diagnosis of MS.  At this time, pt presents with 100% speech intelligibility that doesn't contain any characteristics of MS related dysarthria.    During today's evaluation, pt demonstrated pressured speech characterized as rapidly talking, very little pausing, chaotic speech, intermittently talking over clinician, dominating conversation, disjointed thoughts/comments with abrupt shits in topics (flight of ideas) with very little logical connection. Her verbal communication was overwhelming, confusing and hard for the listener to follow. Attempts were made to redicrect pt to topic or have pt pause speech to improve attention. Pt was not able to perform task despite maximal A. In addition, pt demonstrated failure to answer questions coherently, didn't realize that was she said was unusual, illogical, disconnect, frequently tangential. Pt appeared unable to filter or prioritize thoughts or actions.   Despite the above, pt is able to communicate wants and needs effectively and in reviewing pts chart, her messages among multiple providers are clear concise and contain great follow up information.   Further chart review reveals that pt and her current counselor are targeting the above symptoms. Given that etiology of these symptoms is likely more lated to current psychological diagnoses, recommend she continue to follow psychological services for effective interventions.   At this time, skilled ST services are not appropriate. Education provided to pt on scope of ST services and the above recommendation. All questions were answered  to the best of this writer's abilities.   Zanyah Lentsch B. Rubbie, M.S., CCC-SLP, Tree Surgeon Certified Brain Injury Specialist [facility]   [facility] Rehabilitation Services Office [phone] Ascom [phone] Fax [phone]

## 2024-10-19 ENCOUNTER — Other Ambulatory Visit (HOSPITAL_COMMUNITY): Payer: Self-pay

## 2024-10-20 ENCOUNTER — Other Ambulatory Visit: Payer: Self-pay | Admitting: Family Medicine

## 2024-10-20 DIAGNOSIS — E1159 Type 2 diabetes mellitus with other circulatory complications: Secondary | ICD-10-CM

## 2024-10-20 MED ORDER — FREESTYLE LIBRE 3 READER DEVI: 0 refills | Status: AC

## 2024-10-20 NOTE — Telephone Encounter (Signed)
 Approval letter index.

## 2024-10-20 NOTE — Telephone Encounter (Signed)
 Received approval letter from [facility] [employer]  thru 11/15/2025, approval letter index.

## 2024-10-23 ENCOUNTER — Ambulatory Visit: Admitting: Speech Pathology

## 2024-10-23 ENCOUNTER — Ambulatory Visit

## 2024-10-23 DIAGNOSIS — G35D Multiple sclerosis, unspecified: Secondary | ICD-10-CM

## 2024-10-23 DIAGNOSIS — M6281 Muscle weakness (generalized): Secondary | ICD-10-CM

## 2024-10-23 DIAGNOSIS — R278 Other lack of coordination: Secondary | ICD-10-CM

## 2024-10-23 DIAGNOSIS — R41841 Cognitive communication deficit: Secondary | ICD-10-CM | POA: Diagnosis not present

## 2024-10-24 DIAGNOSIS — Z1211 Encounter for screening for malignant neoplasm of colon: Secondary | ICD-10-CM | POA: Diagnosis not present

## 2024-10-24 DIAGNOSIS — K5903 Drug induced constipation: Secondary | ICD-10-CM | POA: Diagnosis not present

## 2024-10-24 NOTE — Therapy (Signed)
 OUTPATIENT OCCUPATIONAL THERAPY NEURO TREATMENT  Patient Name: Emily Moon MRN: [id number] DOB:January 04, 1961, 63 y.o., female Today's Date: 10/24/2024  PCP: Dr. Jacques Copland  REFERRING PROVIDER: Dr. Arthea Farrow   END OF SESSION:  OT End of Session - 10/24/24 1604     Visit Number 3    Number of Visits 24    Date for Recertification  12/20/24    OT Start Time 1405    OT Stop Time 1445    OT Time Calculation (min) 40 min    Activity Tolerance Patient tolerated treatment well    Behavior During Therapy [facility] for tasks assessed/performed         Past Medical History:  Diagnosis Date   Anxiety 11-16-89   Asthma 11-16-88   Bipolar disorder (HCC) 05/21/2014   Chronic pain syndrome 01/07/2011   COPD (chronic obstructive pulmonary disease) (HCC) 11-17-19   Diabetes mellitus without complication (HCC) 11-16-2020   Fibromyalgia syndrome    GERD (gastroesophageal reflux disease) 11-16-92   Gout    Hip dysplasia, congenital 09/15/2013   History of kidney stones    Hypercholesterolemia    Multiple sclerosis    Osteoporosis    Psoriasis    Seasonal allergies    Sleep apnea 2012   sleep study / slight, no interventions   Vitamin B 12 deficiency    Past Surgical History:  Procedure Laterality Date   CATARACT EXTRACTION W/PHACO Left 05/21/2015   Procedure: CATARACT EXTRACTION PHACO AND INTRAOCULAR LENS PLACEMENT (IOC);  Surgeon: Elsie Carmine, MD;  Location: [facility];  Service: Ophthalmology;  Laterality: Left;  US  00:35 AP% 22.9 CDE 8.11 fluid pack lot #[id number] H   CATARACT EXTRACTION W/PHACO Right 06/04/2015   Procedure: CATARACT EXTRACTION PHACO AND INTRAOCULAR LENS PLACEMENT (IOC);  Surgeon: Elsie Carmine, MD;  Location: [facility];  Service: Ophthalmology;  Laterality: Right;  US :00:48 AP%: 10.5 CDE:5.08 Fluid lot #[id number] H   CYSTOSCOPY/URETEROSCOPY/HOLMIUM LASER/STENT PLACEMENT Bilateral 09/22/2016   Procedure: CYSTOSCOPY/URETEROSCOPY/HOLMIUM LASER/STENT PLACEMENT;   Surgeon: Rosina Riis, MD;  Location: [facility];  Service: Urology;  Laterality: Bilateral;   EYE SURGERY  2015 and 2016   tissue biopsy   FOOT SURGERY  11/16/2013   JOINT REPLACEMENT Left 2013 and 2022   hip replacement   LITHOTRIPSY     PTOSIS REPAIR Bilateral 02/18/2016   Procedure: BILATERAL PTOSIS REPAIR UPPER EYELIDS;  Surgeon: Greig CHRISTELLA Gay, MD;  Location: [facility];  Service: Ophthalmology;  Laterality: Bilateral;  LEAVE PT EARLY AM   thumb surgery Right    TONSILLECTOMY  11/17/1971   TOTAL HIP ARTHROPLASTY Right 07/02/2021   Procedure: TOTAL HIP ARTHROPLASTY ANTERIOR APPROACH;  Surgeon: Melodi Lerner, MD;  Location: [facility];  Service: Orthopedics;  Laterality: Right;   Patient Active Problem List   Diagnosis Date Noted   SVT (supraventricular tachycardia) 04/15/2023   Angioedema 12/03/2022   Prolonged QT interval 03/23/2022   Iron  deficiency anemia 12/25/2021   Myalgia due to statin 11/04/2021   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   Psoriasis 11/02/2019   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   Obstructive sleep apnea syndrome 12/02/2018   Environmental and seasonal allergies 10/24/2018   Vitamin D  deficiency 02/15/2018   B12 deficiency 05/22/2016   Chronic back pain greater than 3 months duration 10/31/2014   Bipolar disorder (HCC) 05/21/2014   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   Insomnia 09/01/2011   HLD (hyperlipidemia) 01/20/2011   Chronic pain syndrome 01/07/2011   Multiple sclerosis, progressive relapsing 08/28/2010  NEPHROLITHIASIS, HX OF 08/28/2010   Gastroesophageal reflux disease 08/28/2010   History of urinary stone 08/28/2010   ONSET DATE: 1991  REFERRING DIAG: MS  THERAPY DIAG:  Muscle weakness (generalized)  Other lack of coordination  Multiple sclerosis  Rationale for Evaluation and Treatment: Rehabilitation  SUBJECTIVE:  SUBJECTIVE STATEMENT: Pt reports that she needs to review some of  her arm exercises, as she was not sure if she was doing them right at home.   Pt accompanied by: self  PERTINENT HISTORY:   Pt reports she was diagnosed with MS in 1991, experienced worsening weakness in the arms and hands in 2009, which further worsened about 6 weeks ago.  Per chart from Dr. Lane on 09/14/24:  [name] is a 63 y.o. female presenting for evaluation of  MS/ WEAKNESS/ FATIGUE/ NUMBNESS/ MEMORY LOSS/ DEPRESSION/  - Worsening, again. - Patient reports no new MS flare-ups. Endorses worsening memory issues, including repeating questions and conversations, and struggling to recall familiar names, faces, and locations. Reports muscle spasms, numbness, and tingling in both lower extremities. Sleep is stable, and her mood is described as okay. Taking metformin  500 mg twice a day, Methocarbamol  500 mg in the morning and 750 nightly.  - Refer to Occupational Therapy for upper extremities weakness and incoordination.  - Reviewed [facility] evaluation for possible multiple sclerosis, ongoing.  - Recommend that patient brings in medical records from [facility] to facilitate ordering Lab tests as requested.  - Recommend patient to apply for the charity care program through [facility] or [place], as patient reports being unable to afford medical care and requires extensive diagnostic work-up. - Continue taking Metformin  500 mg twice a day.  - Continue taking Methocarbamol  500 mg in the morning and 750 nightly.   PRECAUTIONS: Fall  WEIGHT BEARING RESTRICTIONS: No  PAIN: 10/23/24: 4/10 pain, generalized  Are you having pain? Yes: NPRS scale: 3-5/10  Pain location: neck (5) legs (3), low back (4), everything always hurts  Pain description: hamstrings feel like stretching and on fire, low back is aching Aggravating factors: cold weather, chronic pain d/t MS Relieving factors: medication  FALLS: Has patient fallen in last 6 months? No  LIVING ENVIRONMENT: Lives with: lives alone Lives in: ground level  apartment Stairs: No Has following equipment at home: Single point cane, Environmental Consultant - 2 wheeled, Environmental Consultant - 4 wheeled, bed side commode, and Grab bars  PLOF: Needs assistance with homemaking.  Pt has 2 women who provide caregiver assistance on different week days.  Pt reports they assist with laundry, housekeeping, running errands.  Pt is indep with basic ADLs at baseline.  Caregivers are present M, W, Th, F between 2 and 5 hours per day.    PATIENT GOALS: To strengthen my hands and my arms.  OBJECTIVE:  Note: Objective measures were completed at Evaluation unless otherwise noted.  HAND DOMINANCE: Right  ADLs: Overall ADLs: Pt reports that she's been dropping ADL supplies more often over the last 6 weeks, including her phone Transfers/ambulation related to ADLs: 4 wheeled walker for community distances  Eating: difficulty manipulating eating utensils for cutting food Grooming: Pt acknowledges dropping toothpaste tube/cap UB Dressing: indep LB Dressing: indep  Toileting: increased effort d/t low toilet; pt reports she's trying to get a standard toilet from apt complex Bathing: Walkin shower; tries to shower when caregivers are present (distant supv) Tub Shower transfers: distant supv-modified indep  IADLs: Shopping: caregivers manage at baseline Light housekeeping: caregivers manage at baseline Meal Prep: difficulty opening jars; pt  reports that she orders out a lot Community mobility: modified indep with rollator Medication management: indep Financial management: indep Handwriting: 100% legible  LEISURE:  Pt is an tree surgeon, 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  Pt reports she can paint with water  colors on flat table top, as arms are supported.    POSTURE COMMENTS:  No Significant postural limitations  ACTIVITY TOLERANCE: Activity tolerance: To be further assessed within upcoming sessions  FUNCTIONAL OUTCOME  MEASURES: MAM-20 for neurological conditions: TBD 10/05/24: MAM-20 for neurological conditions: 66.4  UPPER EXTREMITY ROM:  BUEs WFL  UPPER EXTREMITY MMT:     Active ROM Right eval Left eval  Shoulder flexion 4- 4-  Shoulder abduction 4- 4-  Shoulder adduction    Shoulder extension    Shoulder internal rotation 4- 4-  Shoulder external rotation 4- 4-  Elbow flexion 4 4  Elbow extension 4+ 4+  Wrist flexion 4 4  Wrist extension 4- 4-  Wrist ulnar deviation    Wrist radial deviation    Wrist pronation    Wrist supination    (Blank rows = not tested)  HAND FUNCTION: Grip strength: Right: 45 lbs; Left: 54 lbs, Lateral pinch: Right: 14 lbs, Left: 15 lbs, and 3 point pinch: Right: 13 lbs, Left: 14 lbs  COORDINATION: 9 Hole Peg test: Right: 24 sec; Left: 27 sec  SENSATION: Pt reports numbness in palms   EDEMA: No visible edema  MUSCLE TONE: RUE: Within functional limits and LUE: Within functional limits  COGNITION: Overall cognitive status: Impaired; pt reports worsening memory and word finding (SLP eval recommended)  VISION: Subjective report: reading glasses, hx of cataract sx in 2016; pt reports things are darker/dimmer in R eye, no reports of diplopia   PERCEPTION: WFL  PRAXIS: WFL  OBSERVATIONS: Pt pleasant, cooperative, and reports eagerness to improve BUE strength and coordination.                                                                                                 TREATMENT DATE: 12/8/5: Self Care: -Review of written/visual handouts for BUE HEP from access code below; provided additional written cues for improved carryover of positioning theraband and BUEs for each exercise.   Therapeutic Exercises: BUE strengthening: 1 set x 10 reps for green theraband exercises noted below, with MOD cues for technique  - Seated Elbow Flexion with Self-Anchored Resistance   - Seated Elbow Extension with Self-Anchored Resistance  - Seated Shoulder Horizontal  Abduction with Resistance  - Seated Diagonal Reach with Resistance   -3# dowel: chest press, shoulder press, ER behind head x10 reps each; min vc for form/technique/pacing -Grip strengthening: Hand gripper set at 17.9# on the R hand, 11.2# on the L hand; pt removed jumbo pegs from pegboard x3 trials each hand.  PATIENT EDUCATION: Education details: HEP review Person educated: Patient Education method: Explanation, Demonstration, Actor cues, Verbal cues, and Handouts Education comprehension: verbalized understanding and needs further education  HOME EXERCISE PROGRAM: Access Code: [email or web address] URL: [email or web address] Date: 10/05/2024  GOALS: Goals reviewed with patient? Yes  SHORT TERM GOALS: Target date: 11/08/24  Pt will be indep to perform HEP for improving BUE strength for daily tasks. Baseline: Eval: Not yet initiated Goal status: INITIAL  LONG TERM GOALS: Target date: 12/20/24  Pt will increase MAM-20 score for neurological conditions by (TBD) or more points to indicate improvement in self perceived functional use of the BUEs for daily tasks.  Baseline: Eval: TBD Goal status: INITIAL  2.  Pt will increase R grip strength by 15 or more lbs, and L grip strength by 5 or more lbs to improve ability to open tight jars.  Baseline: R 45 lbs, L 54 lbs (non-dominant); difficulty opening jars for meal prep Goal status: INITIAL  3.  Pt will increase bilat shoulder flex/abd strength by 1 full muscle grade to improve tolerance for painting on a canvas. Baseline: Eval: R/L shoulder flex/abd 4-/5; pt reports inability to hold arms up to paint on a vertical canvas d/t weakness Goal status: INITIAL  4.  Pt will increase R/L lateral pinch strength by 3 or more lbs to ease ability to open food packages and grooming containers (ie toothpaste tubes/lotion).  Baseline: Eval: R 14 lbs, L 15 lbs  Goal status: INITIAL  ASSESSMENT:  CLINICAL IMPRESSION: Pt tolerated BUE  strengthening well with rest breaks between sets.  Pt verbalizes arm fatigue by 10 reps of each.  Mod vc required for accurate completion of at least 50% of HEP with use of visual handout.  Pt reports she did try them at home and got confused, but demos better understanding with additional written cues added to handout today.  Pt continues to show deficits in BUE strength and tolerance for activity. Pt will continue to benefit from skilled OT to address above noted deficits, working towards goals noted above in order to maximize indep with daily tasks and leisure activities, reduce burden of care on caregivers, and improve QOL.  PERFORMANCE DEFICITS: in functional skills including ADLs, IADLs, coordination, strength, pain, Fine motor control, mobility, balance, body mechanics, endurance, decreased knowledge of precautions, decreased knowledge of use of DME, and UE functional use, cognitive skills including memory and temperament/personality, and psychosocial skills including coping strategies, environmental adaptation, habits, and routines and behaviors.   IMPAIRMENTS: are limiting patient from ADLs, IADLs, and leisure.   CO-MORBIDITIES: has co-morbidities such as bipolar, asthma, anxiety, depression, DM2, COPD that affects occupational performance. Patient will benefit from skilled OT to address above impairments and improve overall function.  MODIFICATION OR ASSISTANCE TO COMPLETE EVALUATION: No modification of tasks or assist necessary to complete an evaluation.  OT OCCUPATIONAL PROFILE AND HISTORY: Detailed assessment: Review of records and additional review of physical, cognitive, psychosocial history related to current functional performance.  CLINICAL DECISION MAKING: Moderate - several treatment options, min-mod task modification necessary  REHAB POTENTIAL: Good  EVALUATION COMPLEXITY: Moderate    PLAN:  OT FREQUENCY: 2x/week  OT DURATION: 12 weeks  PLANNED INTERVENTIONS: 97168 OT  Re-evaluation, 97535 self care/ADL training, 02889 therapeutic exercise, 97530 therapeutic activity, 97112 neuromuscular re-education, 97140 manual therapy, 97010 moist heat, 97010 cryotherapy, 97034 contrast bath, 97750 Physical Performance Testing, psychosocial skills training, energy conservation, coping strategies training, patient/family education, and DME and/or AE instructions  RECOMMENDED OTHER SERVICES: Recommendation for SLP eval d/t reported worsening memory and word finding  CONSULTED AND AGREED WITH PLAN OF CARE: Patient  PLAN FOR NEXT SESSION:  see above  Inocente Blazing, MS, OTR/L   Inocente MARLA Blazing, OT 10/24/2024, 4:05 PM

## 2024-10-25 ENCOUNTER — Ambulatory Visit

## 2024-10-25 ENCOUNTER — Ambulatory Visit: Admitting: Speech Pathology

## 2024-10-30 ENCOUNTER — Other Ambulatory Visit: Payer: Self-pay | Admitting: Family Medicine

## 2024-10-30 ENCOUNTER — Ambulatory Visit: Admitting: Occupational Therapy

## 2024-10-30 ENCOUNTER — Encounter

## 2024-10-30 ENCOUNTER — Ambulatory Visit: Admitting: Podiatry

## 2024-10-30 DIAGNOSIS — S9030XA Contusion of unspecified foot, initial encounter: Secondary | ICD-10-CM | POA: Diagnosis not present

## 2024-10-30 MED ORDER — TRULICITY 3 MG/0.5ML ~~LOC~~ SOAJ: SUBCUTANEOUS | 3 refills | Status: DC

## 2024-10-30 NOTE — Progress Notes (Signed)
 She presents today after dropping a 40 ounce Stanley mug at 4:00 in the morning on Saturday on her right foot.  She states that is still sore on the top though she does have some neuropathy associated with her MS.  She states that as soon as she did it and developed a large goose egg and she started rubbing it.  Objective: Vital signs are stable she is alert and oriented x 3 she has good strong palpable pulse of the right lower extremity no palpable bruit is noted.  She does not have a change in her sensation dorsally.  She does have a large circular bruise that is nontender on palpation.  Radiographs taken today demonstrate osseously mature individual with considerable bone demineralization.  No fractures are visualized at that time.  Assessment: Contusion foot right.  Plan: Discussed and continued ice therapy and notify me with any changes in the amount of pain that she is having.

## 2024-11-01 ENCOUNTER — Ambulatory Visit (HOSPITAL_COMMUNITY): Admitting: Psychiatry

## 2024-11-01 ENCOUNTER — Ambulatory Visit

## 2024-11-06 ENCOUNTER — Ambulatory Visit: Admitting: Occupational Therapy

## 2024-11-07 ENCOUNTER — Other Ambulatory Visit (HOSPITAL_COMMUNITY): Payer: Self-pay

## 2024-11-07 ENCOUNTER — Ambulatory Visit (INDEPENDENT_AMBULATORY_CARE_PROVIDER_SITE_OTHER): Admitting: Clinical

## 2024-11-07 DIAGNOSIS — F315 Bipolar disorder, current episode depressed, severe, with psychotic features: Secondary | ICD-10-CM

## 2024-11-07 DIAGNOSIS — F609 Personality disorder, unspecified: Secondary | ICD-10-CM

## 2024-11-07 DIAGNOSIS — Z8659 Personal history of other mental and behavioral disorders: Secondary | ICD-10-CM

## 2024-11-07 MED ORDER — TRULICITY 3 MG/0.5ML ~~LOC~~ SOAJ: 3.0000 mg | SUBCUTANEOUS | 3 refills | Status: AC

## 2024-11-07 MED ORDER — TEMAZEPAM 30 MG PO CAPS: 60.0000 mg | ORAL_CAPSULE | Freq: Every evening | ORAL | 0 refills | Status: DC | PRN
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
## 2024-11-07 NOTE — Addendum Note (Signed)
 Addended by: WENDELL ARLAND RAMAN on: 11/07/2024 12:25 PM   Modules accepted: Orders

## 2024-11-11 ENCOUNTER — Encounter (HOSPITAL_COMMUNITY): Payer: Self-pay | Admitting: Clinical

## 2024-11-13 ENCOUNTER — Encounter: Payer: Self-pay | Admitting: Family Medicine

## 2024-11-13 ENCOUNTER — Ambulatory Visit

## 2024-11-13 ENCOUNTER — Ambulatory Visit: Admitting: Speech Pathology

## 2024-11-13 ENCOUNTER — Ambulatory Visit: Admitting: Family Medicine

## 2024-11-13 VITALS — BP 100/68 | HR 86 | Temp 99.4°F | Ht 68.0 in | Wt 227.0 lb

## 2024-11-13 DIAGNOSIS — J4489 Other specified chronic obstructive pulmonary disease: Secondary | ICD-10-CM

## 2024-11-13 DIAGNOSIS — M898X1 Other specified disorders of bone, shoulder: Secondary | ICD-10-CM | POA: Diagnosis not present

## 2024-11-13 DIAGNOSIS — G35D Multiple sclerosis, unspecified: Secondary | ICD-10-CM

## 2024-11-13 DIAGNOSIS — M5412 Radiculopathy, cervical region: Secondary | ICD-10-CM | POA: Diagnosis not present

## 2024-11-13 DIAGNOSIS — M6281 Muscle weakness (generalized): Secondary | ICD-10-CM

## 2024-11-13 DIAGNOSIS — R41841 Cognitive communication deficit: Secondary | ICD-10-CM | POA: Diagnosis not present

## 2024-11-13 DIAGNOSIS — R278 Other lack of coordination: Secondary | ICD-10-CM

## 2024-11-13 DIAGNOSIS — E119 Type 2 diabetes mellitus without complications: Secondary | ICD-10-CM | POA: Diagnosis not present

## 2024-11-13 MED ORDER — PREGABALIN 75 MG PO CAPS: 75.0000 mg | ORAL_CAPSULE | Freq: Two times a day (BID) | ORAL | 3 refills | Status: AC

## 2024-11-13 NOTE — Therapy (Signed)
 " OUTPATIENT OCCUPATIONAL THERAPY NEURO TREATMENT  Patient Name: Emily Moon MRN: [id number] DOB:1961/09/15, 63 y.o., female Today's Date: 11/13/2024  PCP: Dr. Jacques Moon  REFERRING PROVIDER: Dr. Arthea Moon   END OF SESSION:  OT End of Session - 11/13/24 1311     Visit Number 4    Number of Visits 24    Date for Recertification  12/20/24    OT Start Time 1315    OT Stop Time 1353    OT Time Calculation (min) 38 min    Activity Tolerance Patient tolerated treatment well    Behavior During Therapy [facility] for tasks assessed/performed         Past Medical History:  Diagnosis Date   Anxiety 11-16-89   Asthma 11-16-88   Bipolar disorder (HCC) 05/21/2014   Chronic pain syndrome 01/07/2011   COPD (chronic obstructive pulmonary disease) (HCC) 11-17-19   Diabetes mellitus without complication (HCC) 11-16-2020   Fibromyalgia syndrome    GERD (gastroesophageal reflux disease) 11-16-92   Gout    Hip dysplasia, congenital 09/15/2013   History of kidney stones    Hypercholesterolemia    Multiple sclerosis    Osteoporosis    Psoriasis    Seasonal allergies    Sleep apnea 2012   sleep study / slight, no interventions   Vitamin B 12 deficiency    Past Surgical History:  Procedure Laterality Date   CATARACT EXTRACTION W/PHACO Left 05/21/2015   Procedure: CATARACT EXTRACTION PHACO AND INTRAOCULAR LENS PLACEMENT (IOC);  Surgeon: Emily Carmine, MD;  Location: [facility];  Service: Ophthalmology;  Laterality: Left;  US  00:35 AP% 22.9 CDE 8.11 fluid pack lot #[id number]   CATARACT EXTRACTION W/PHACO Right 06/04/2015   Procedure: CATARACT EXTRACTION PHACO AND INTRAOCULAR LENS PLACEMENT (IOC);  Surgeon: Emily Carmine, MD;  Location: [facility];  Service: Ophthalmology;  Laterality: Right;  US :00:48 AP%: 10.5 CDE:5.08 Fluid lot #[id number]   CYSTOSCOPY/URETEROSCOPY/HOLMIUM LASER/STENT PLACEMENT Bilateral 09/22/2016   Procedure: CYSTOSCOPY/URETEROSCOPY/HOLMIUM LASER/STENT PLACEMENT;   Surgeon: Emily Riis, MD;  Location: [facility];  Service: Urology;  Laterality: Bilateral;   EYE SURGERY  2015 and 2016   tissue biopsy   FOOT SURGERY  11/16/2013   JOINT REPLACEMENT Left 2013 and 2022   hip replacement   LITHOTRIPSY     PTOSIS REPAIR Bilateral 02/18/2016   Procedure: BILATERAL PTOSIS REPAIR UPPER EYELIDS;  Surgeon: Emily CHRISTELLA Gay, MD;  [facility];  Service: Ophthalmology;  Laterality: Bilateral;  LEAVE PT EARLY AM   thumb surgery Right    TONSILLECTOMY  11/17/1971   TOTAL HIP ARTHROPLASTY Right 07/02/2021   Procedure: TOTAL HIP ARTHROPLASTY ANTERIOR APPROACH;  Surgeon: Emily Lerner, MD;  Location: [facility];  Service: Orthopedics;  Laterality: Right;   Patient Active Problem List   Diagnosis Date Noted   SVT (supraventricular tachycardia) 04/15/2023   Angioedema 12/03/2022   Prolonged QT interval 03/23/2022   Iron  deficiency anemia 12/25/2021   Myalgia due to statin 11/04/2021   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   Psoriasis 11/02/2019   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   Obstructive sleep apnea syndrome 12/02/2018   Environmental and seasonal allergies 10/24/2018   Vitamin D  deficiency 02/15/2018   B12 deficiency 05/22/2016   Chronic back pain greater than 3 months duration 10/31/2014   Bipolar disorder (HCC) 05/21/2014   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   Insomnia 09/01/2011   HLD (hyperlipidemia) 01/20/2011   Chronic pain syndrome 01/07/2011   Multiple sclerosis, progressive relapsing 08/28/2010  NEPHROLITHIASIS, HX OF 08/28/2010   Gastroesophageal reflux disease 08/28/2010   History of urinary stone 08/28/2010   ONSET DATE: 1991  REFERRING DIAG: MS  THERAPY DIAG:  Muscle weakness (generalized)  Other lack of coordination  Multiple sclerosis  Rationale for Evaluation and Treatment: Rehabilitation  SUBJECTIVE:  SUBJECTIVE STATEMENT: Pt reports that her pain has been bad in her  mid back, radiating to the R shoulder and down the R side of her trunk.     Pt accompanied by: self  PERTINENT HISTORY:   Pt reports she was diagnosed with MS in 1991, experienced worsening weakness in the arms and hands in 2009, which further worsened about 6 weeks ago.  Per chart from [facility] on 09/14/24:  Emily Moon is a 63 y.o. female presenting for evaluation of  MS/ WEAKNESS/ FATIGUE/ NUMBNESS/ MEMORY LOSS/ DEPRESSION/  - Worsening, again. - Patient reports no new MS flare-ups. Endorses worsening memory issues, including repeating questions and conversations, and struggling to recall familiar names, faces, and locations. Reports muscle spasms, numbness, and tingling in both lower extremities. Sleep is stable, and her mood is described as okay. Taking metformin  500 mg twice a day, Methocarbamol  500 mg in the morning and 750 nightly.  - Refer to Occupational Therapy for upper extremities weakness and incoordination.  - Reviewed [facility] evaluation for possible multiple sclerosis, ongoing.  - Recommend that patient brings in medical records from [facility] to facilitate ordering Lab tests as requested.  - Recommend patient to apply for the charity care program through [facility] or [facility], as patient reports being unable to afford medical care and requires extensive diagnostic work-up. - Continue taking Metformin  500 mg twice a day.  - Continue taking Methocarbamol  500 mg in the morning and 750 nightly.   PRECAUTIONS: Fall  WEIGHT BEARING RESTRICTIONS: No  PAIN: 11/13/24: 6/10 pain, generalized, but reports up to 8/10 over the holiday, which prevented her from visiting with family over the holiday Are you having pain? Yes: NPRS scale: 3-5/10  Pain location: neck (5) legs (3), low back (4), everything always hurts  Pain description: hamstrings feel like stretching and on fire, low back is aching Aggravating factors: cold weather, chronic pain d/t MS Relieving factors: medication  FALLS: Has  patient fallen in last 6 months? No  LIVING ENVIRONMENT: Lives with: lives alone Lives in: ground level apartment Stairs: No Has following equipment at home: Single point cane, Environmental Consultant - 2 wheeled, Environmental Consultant - 4 wheeled, bed side commode, and Grab bars  PLOF: Needs assistance with homemaking.  Pt has 2 women who provide caregiver assistance on different week days.  Pt reports they assist with laundry, housekeeping, running errands.  Pt is indep with basic ADLs at baseline.  Caregivers are present M, W, Th, F between 2 and 5 hours per day.    PATIENT GOALS: To strengthen my hands and my arms.  OBJECTIVE:  Note: Objective measures were completed at Evaluation unless otherwise noted.  HAND DOMINANCE: Right  ADLs: Overall ADLs: Pt reports that she's been dropping ADL supplies more often over the last 6 weeks, including her phone Transfers/ambulation related to ADLs: 4 wheeled walker for community distances  Eating: difficulty manipulating eating utensils for cutting food Grooming: Pt acknowledges dropping toothpaste tube/cap UB Dressing: indep LB Dressing: indep  Toileting: increased effort d/t low toilet; pt reports she's trying to get a standard toilet from apt complex Bathing: Walkin shower; tries to shower when caregivers are present (distant supv) Tub Shower transfers: distant supv-modified indep  IADLs: Shopping: caregivers manage at baseline Light housekeeping: caregivers manage at baseline Meal Prep: difficulty opening jars; pt reports that she orders out a lot Community mobility: modified indep with rollator Medication management: indep Financial management: indep Handwriting: 100% legible  LEISURE:  Pt is an tree surgeon, 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  Pt reports she can paint with water  colors on flat table top, as arms are supported.    POSTURE COMMENTS:  No Significant postural limitations  ACTIVITY  TOLERANCE: Activity tolerance: To be further assessed within upcoming sessions  FUNCTIONAL OUTCOME MEASURES: MAM-20 for neurological conditions: TBD 10/05/24: MAM-20 for neurological conditions: 66.4  UPPER EXTREMITY ROM:  BUEs WFL  UPPER EXTREMITY MMT:     Active ROM Right eval Left eval  Shoulder flexion 4- 4-  Shoulder abduction 4- 4-  Shoulder adduction    Shoulder extension    Shoulder internal rotation 4- 4-  Shoulder external rotation 4- 4-  Elbow flexion 4 4  Elbow extension 4+ 4+  Wrist flexion 4 4  Wrist extension 4- 4-  Wrist ulnar deviation    Wrist radial deviation    Wrist pronation    Wrist supination    (Blank rows = not tested)  HAND FUNCTION: Grip strength: Right: 45 lbs; Left: 54 lbs, Lateral pinch: Right: 14 lbs, Left: 15 lbs, and 3 point pinch: Right: 13 lbs, Left: 14 lbs  COORDINATION: 9 Hole Peg test: Right: 24 sec; Left: 27 sec  SENSATION: Pt reports numbness in palms   EDEMA: No visible edema  MUSCLE TONE: RUE: Within functional limits and LUE: Within functional limits  COGNITION: Overall cognitive status: Impaired; pt reports worsening memory and word finding (SLP eval recommended)  VISION: Subjective report: reading glasses, hx of cataract sx in 2016; pt reports things are darker/dimmer in R eye, no reports of diplopia   PERCEPTION: WFL  PRAXIS: WFL  OBSERVATIONS: Pt pleasant, cooperative, and reports eagerness to improve BUE strength and coordination.                                                                                                 TREATMENT DATE: 12/29/5: Self Care: -Reviewed pain reports/activities causing pain/current pain management strategies: Recommended trial of heating pad for muscle relaxation to the mid back/R scapula -Review of FMC/dexterity handout, highlighting specific tasks to promote improved dexterity for manipulation of paint brush, per pt request.   Therapeutic Exercises: Exercises to promote R  scapular and shoulder mobility:  -Instruction in table slides and shoulder elevation/depression/protraction/retraction/shoulder rolls.  Trialed bilat forward flexion and R shoulder abd, horiz abd and add with CGA via table slide, reinforcing slow/gentle glide within pain limits -Facilitated R/L pinch strengthening: lateral and 3 point pinch; mod vc/demo for prehension patterns.  Pt moved therapy resistant (2#-11#) clips on/off a vertical dowel, transitioning to horiz dowel to limit reach, as reach to vertical dowel was aggravating R shoulder and back pain.   Therapeutic Activity: -Instructed in R/L FMC/dexterity activities to promote dexterity for painting with paint brush, per pt request.  -Performed manipulation  skills with set of marbles: digit opposition, rolling, storage, translation skills -Performed manipulation skills with pen: rotating forward/backward, rolling between thumb to fingertips  PATIENT EDUCATION: Education details: HEP progression  Person educated: Patient Education method: Explanation, Demonstration, Tactile cues, Verbal cues, and Handouts Education comprehension: verbalized understanding, returned demonstration, verbal cues required, and needs further education  HOME EXERCISE PROGRAM: Access Code: [email or web address] URL: [email or web address] Date: 10/05/2024  GOALS: Goals reviewed with patient? Yes  SHORT TERM GOALS: Target date: 11/08/24  Pt will be indep to perform HEP for improving BUE strength for daily tasks. Baseline: Eval: Not yet initiated Goal status: INITIAL  LONG TERM GOALS: Target date: 12/20/24  Pt will increase MAM-20 score for neurological conditions by (TBD) or more points to indicate improvement in self perceived functional use of the BUEs for daily tasks.  Baseline: Eval: TBD Goal status: INITIAL  2.  Pt will increase R grip strength by 15 or more lbs, and L grip strength by 5 or more lbs to improve ability to open tight jars.   Baseline: R 45 lbs, L 54 lbs (non-dominant); difficulty opening jars for meal prep Goal status: INITIAL  3.  Pt will increase bilat shoulder flex/abd strength by 1 full muscle grade to improve tolerance for painting on a canvas. Baseline: Eval: R/L shoulder flex/abd 4-/5; pt reports inability to hold arms up to paint on a vertical canvas d/t weakness Goal status: INITIAL  4.  Pt will increase R/L lateral pinch strength by 3 or more lbs to ease ability to open food packages and grooming containers (ie toothpaste tubes/lotion).  Baseline: Eval: R 14 lbs, L 15 lbs  Goal status: INITIAL  ASSESSMENT:  CLINICAL IMPRESSION: Poor tolerance to R shoulder and scapular active and active assisted ROM.  Pt endorses pain in mid back and periscapular pain on the R which she's been dealing with this for several months and denies any fall related injury.  Pt plans to see MD later this afternoon to address her pain concerns.  Transitioned to [facility] tasks, per pt request, as pt reports that she is needing to improve manipulation skills for using a paint brush.  Pt receptive to [facility] exercises and activities and was given a written handout for carryover at home.  Session ended early d/t pain limiting attention to task and pt eager to get to the doctor this afternoon.  Pt will continue to benefit from skilled OT to address above noted deficits, working towards goals noted above in order to maximize indep with daily tasks and leisure activities, reduce burden of care on caregivers, and improve QOL.  PERFORMANCE DEFICITS: in functional skills including ADLs, IADLs, coordination, strength, pain, Fine motor control, mobility, balance, body mechanics, endurance, decreased knowledge of precautions, decreased knowledge of use of DME, and UE functional use, cognitive skills including memory and temperament/personality, and psychosocial skills including coping strategies, environmental adaptation, habits, and routines and  behaviors.   IMPAIRMENTS: are limiting patient from ADLs, IADLs, and leisure.   CO-MORBIDITIES: has co-morbidities such as bipolar, asthma, anxiety, depression, DM2, COPD that affects occupational performance. Patient will benefit from skilled OT to address above impairments and improve overall function.  MODIFICATION OR ASSISTANCE TO COMPLETE EVALUATION: No modification of tasks or assist necessary to complete an evaluation.  OT OCCUPATIONAL PROFILE AND HISTORY: Detailed assessment: Review of records and additional review of physical, cognitive, psychosocial history related to current functional performance.  CLINICAL DECISION MAKING: Moderate - several treatment options, min-mod task modification necessary  REHAB POTENTIAL: Good  EVALUATION COMPLEXITY: Moderate    PLAN:  OT FREQUENCY: 2x/week  OT DURATION: 12 weeks  PLANNED INTERVENTIONS: 97168 OT Re-evaluation, 97535 self care/ADL training, 02889 therapeutic exercise, 97530 therapeutic activity, 97112 neuromuscular re-education, 97140 manual therapy, 97010 moist heat, 97010 cryotherapy, 97034 contrast bath, 97750 Physical Performance Testing, psychosocial skills training, energy conservation, coping strategies training, patient/family education, and DME and/or AE instructions  RECOMMENDED OTHER SERVICES: Recommendation for SLP eval d/t reported worsening memory and word finding  CONSULTED AND AGREED WITH PLAN OF CARE: Patient  PLAN FOR NEXT SESSION:  see above  Inocente Blazing, MS, OTR/L  Inocente MARLA Blazing, OT 11/13/2024, 2:28 PM       "

## 2024-11-13 NOTE — Progress Notes (Unsigned)
 "    Francetta Ilg T. Amaiah Cristiano, MD, CAQ Sports Medicine [facility] at [facility] [address] [place], [address]  Phone: [phone]  FAX: [phone]  [facility] - 63 y.o. female  MRN [id number]  Date of Birth: 01-08-1961  Date: 11/13/2024  PCP: Watt Mirza, MD  Referral: Watt Mirza, MD  Chief Complaint  Patient presents with   Pain Under Right Shoulder Blade    Hurts to take a deep breath   Nausea   COPD   Subjective:   Em[facility] Moon is a 63 y.o. very pleasant female patient with Body mass index is 34.52 kg/m. who presents with the following:  Discussed the use of AI scribe software for clinical note transcription with the patient, who gave verbal consent to proceed.  R shoulder blade On Robaxin  500 mg q AM and 750 mg at bedtime  Wt Readings from Last 3 Encounters:  11/13/24 227 lb (103 kg)  10/09/24 224 lb (101.6 kg)  09/28/24 229 lb (103.9 kg)    Start lyrica  -  History of Present Illness     Review of Systems is noted in the HPI, as appropriate  Objective:   BP 100/68   Pulse 86   Temp 99.4 F (37.4 C) (Oral)   Ht 5' 8 (1.727 m)   Wt 227 lb (103 kg)   LMP 08/23/2014   SpO2 96%   BMI 34.52 kg/m   GEN: No acute distress; alert,appropriate. PULM: Breathing comfortably in no respiratory distress PSYCH: Normally interactive.   Laboratory and Imaging Data:  Assessment and Plan:   No diagnosis found. Assessment & Plan   Medication Management during today's office visit: No orders of the defined types were placed in this encounter.  There are no discontinued medications.  Orders placed today for conditions managed today: No orders of the defined types were placed in this encounter.   Disposition: No follow-ups on file.  [employer] One speech-to-text software was used for transcription in this dictation.  Possible transcriptional errors can occur using Animal nutritionist.   Signed,  Mirza DASEN.  Taevin Mcferran, MD   Outpatient Encounter Medications as of 11/13/2024  Medication Sig   aspirin  EC 81 MG tablet Take 81 mg by mouth daily. Swallow whole.   Azelastine  HCl 137 MCG/SPRAY SOLN PLACE 2 SPRAYS INTO BOTH NOSTRILS 2 (TWO) TIMES DAILY   B Complex-Biotin -FA (BIG 100, BIOTIN , PO) Take 300 mg by mouth at bedtime.   BD INTEGRA SYRINGE 25G X 1 3 ML MISC SMARTSIG:1 Syringe(s) Once a Month   Biotin  5 MG TBDP Take 200 mg by mouth.   bisacodyl  (DULCOLAX) 5 MG EC tablet Take 5 mg by mouth daily as needed for moderate constipation.   cetirizine (ZYRTEC) 10 MG tablet Take 10 mg by mouth daily.   Cholecalciferol  (VITAMIN D3) 250 MCG (10000 UT) capsule Take 3 capsules by mouth at bedtime.   clindamycin  (CLEOCIN  T) 1 % external solution Apply to scalp once or twice a day prn bumps   clobetasol  cream (TEMOVATE ) 0.05 % Apply 1 Application topically 2 (two) times daily. Apply to aa QD-BID PRN up to two weeks. Then up to 5 days per week as needed. Avoid applying to face, groin, and axilla. Use as directed. Long-term use can cause thinning of the skin.   Continuous Glucose Receiver (FREESTYLE LIBRE 3 READER) DEVI USE AS DIRECTED   Continuous Glucose Receiver (FREESTYLE LIBRE 3 READER) DEVI Use to check blood sugar continuous   Continuous  Glucose Sensor (FREESTYLE LIBRE 3 SENSOR) MISC PLACE 1 SENSOR ON THE SKIN EVERY 14 DAYS. USE TO CHECK GLUCOSE CONTINUOUSLY   cyanocobalamin  (VITAMIN B12) 1000 MCG/ML injection INJECT 1 ML (1,000 MCG TOTAL) INTO THE MUSCLE EVERY 30 (THIRTY) DAYS. FOR B12 VITAMIN   desipramine  (NORPRAMIN ) 25 MG tablet 2  qam   diphenhydrAMINE  (BENADRYL ) 2 % cream Apply topically 3 (three) times daily as needed for itching.   Dulaglutide  (TRULICITY ) 3 MG/0.5ML SOAJ Inject 3 mg into the skin once a week.   DULoxetine  (CYMBALTA ) 20 MG capsule Take 2 capsules (40 mg total) by mouth daily.   estradiol  (ESTRACE ) 0.1 MG/GM vaginal cream Estrogen Cream Instruction Discard applicator Apply pea sized  amount to tip of finger to urethra before bed. Wash hands well after application. Use Monday, Wednesday and Friday   Evolocumab  (REPATHA  SURECLICK) 140 MG/ML SOAJ INJECT 140 MG INTO THE SKIN EVERY 14 (FOURTEEN) DAYS. CALL TO SCHEDULE APPT PRIOR TO NEXT REFILL   fenofibrate  (TRICOR ) 145 MG tablet TAKE 1 TABLET BY MOUTH DAILY FOR CHOLESTEROL *NEED APPOINTMENT   Fluticasone -Umeclidin-Vilant (TRELEGY ELLIPTA ) 200-62.5-25 MCG/ACT AEPB Inhale 1 puff into the lungs daily.   hydrOXYzine  (VISTARIL ) 50 MG capsule Take 2 capsules (100 mg total) by mouth at bedtime. TAKE 1 CAPSLE BY MOUTH AT NOON, 1 AT 6 PM, AND 1 AS NEEDED   ibuprofen  (ADVIL ) 200 MG tablet Take 800 mg by mouth 2 (two) times daily.   ketoconazole  (NIZORAL ) 2 % shampoo Apply 1 Application topically as directed. Wash from waist up 3 times a week for 2 months, then decrease to 1 time a month, let sit 5 minutes and rinse off   L-Lysine  HCl 500 MG TABS Take 1,000 mg by mouth.   lamoTRIgine  (LAMICTAL ) 150 MG tablet Take 2 tablets (300 mg total) by mouth daily at bedtime.   levalbuterol  (XOPENEX  HFA) 45 MCG/ACT inhaler Inhale 2 puffs into the lungs every 4 (four) hours as needed for wheezing or shortness of breath (or cough).   lidocaine  (LMX) 4 % cream Apply 1 Application topically daily as needed.   metFORMIN  (GLUCOPHAGE ) 500 MG tablet TAKE 2 TABS BY MOUTH EVERY MORNING AND 1 TAB IN THE EVENING   methocarbamol  (ROBAXIN ) 500 MG tablet Take 1,500 mg by mouth at bedtime.   metoprolol  tartrate (LOPRESSOR ) 50 MG tablet TAKE 1 TABLET BY MOUTH TWICE A DAY   montelukast  (SINGULAIR ) 10 MG tablet TAKE 1 TABLET BY MOUTH EVERYDAY AT BEDTIME   mupirocin  ointment (BACTROBAN ) 2 % Apply 1 Application topically daily. Qd to wound on finger   neomycin -bacitracin-polymyxin (NEOSPORIN) [phone] ointment Apply 1 application topically 4 (four) times daily as needed (for cut/scrapes.).   NEOMYCIN -POLYMYXIN-HYDROCORTISONE (CORTISPORIN) 1 % SOLN OTIC solution Apply 1-2  drops to toe BID after soaking   pantoprazole  (PROTONIX ) 40 MG tablet Take 1 tablet (40 mg total) by mouth 2 (two) times daily before a meal. For heartburn   polyvinyl alcohol  (LIQUIFILM TEARS) 1.4 % ophthalmic solution Place 2 drops into both eyes at bedtime.   Roflumilast  (ZORYVE ) 0.3 % CREA Apply 1 application  topically daily. qd to aa psoriasis on legs, arms   Ruxolitinib Phosphate  (OPZELURA ) 1.5 % CREA Apply to aa's rash QD PRN.   STIOLTO RESPIMAT  2.5-2.5 MCG/ACT AERS Inhale 2 puffs into the lungs at bedtime.   temazepam  (RESTORIL ) 30 MG capsule Take 2 capsules (60 mg total) by mouth at bedtime as needed for sleep.   traZODone  (DESYREL ) 100 MG tablet 3 qhs   valACYclovir  (VALTREX ) 1000  MG tablet Take 2 tablets by mouth twice daily for 1 day as needed for herpes outbreak.   No facility-administered encounter medications on file as of 11/13/2024.   "

## 2024-11-15 ENCOUNTER — Ambulatory Visit: Admitting: Occupational Therapy

## 2024-11-16 ENCOUNTER — Encounter: Payer: Self-pay | Admitting: Oncology

## 2024-11-18 ENCOUNTER — Other Ambulatory Visit (HOSPITAL_COMMUNITY): Payer: Self-pay | Admitting: Psychiatry

## 2024-11-20 ENCOUNTER — Ambulatory Visit: Admitting: Occupational Therapy

## 2024-11-22 ENCOUNTER — Encounter (HOSPITAL_COMMUNITY): Payer: Self-pay | Admitting: Psychiatry

## 2024-11-22 ENCOUNTER — Ambulatory Visit

## 2024-11-22 ENCOUNTER — Ambulatory Visit (HOSPITAL_COMMUNITY): Admitting: Psychiatry

## 2024-11-22 VITALS — BP 110/73 | HR 89 | Ht 68.0 in | Wt 227.0 lb

## 2024-11-22 DIAGNOSIS — F320 Major depressive disorder, single episode, mild: Secondary | ICD-10-CM | POA: Diagnosis not present

## 2024-11-22 DIAGNOSIS — F310 Bipolar disorder, current episode hypomanic: Secondary | ICD-10-CM

## 2024-11-22 MED ORDER — TRAZODONE HCL 100 MG PO TABS: ORAL_TABLET | ORAL | 4 refills | Status: AC

## 2024-11-22 MED ORDER — DESIPRAMINE HCL 25 MG PO TABS: ORAL_TABLET | ORAL | 2 refills | Status: AC

## 2024-11-22 MED ORDER — TEMAZEPAM 30 MG PO CAPS: 60.0000 mg | ORAL_CAPSULE | Freq: Every evening | ORAL | 0 refills | Status: AC | PRN

## 2024-11-22 MED ORDER — TEMAZEPAM 30 MG PO CAPS: 60.0000 mg | ORAL_CAPSULE | Freq: Every evening | ORAL | 0 refills | Status: DC | PRN

## 2024-11-22 MED ORDER — DULOXETINE HCL 20 MG PO CPEP: 40.0000 mg | ORAL_CAPSULE | Freq: Every day | ORAL | 5 refills | Status: AC

## 2024-11-22 NOTE — Progress Notes (Signed)
 The most painful.  This  Psychiatric Initial Adult Assessment   Patient Identification: Emily Moon MRN:  [id number] Date of Evaluation:  11/22/2024 Referral Source: From the community Chief Complaint: Feels exhausted     Today the patient is seen in the office.  Unfortunately she is about 10 to 15 minutes late which is not like her.  Nonetheless her mood seems to be pretty stable.  She is taking all her medicines as prescribed including desipramine  Cymbalta .  For sleep she takes trazodone  and Restoril  and she is on a CPAP machine.  She is eating well she lives alone.  Her son recently came to visit her for a short visit but he did not come.  She feels good about that.  Patient continues in therapy in our center and it seems to be beneficial.  The patient says that her legs feel weaker probably related to MS.  Patient will come back to see me in approximately 3 months.   Procedure(s) Performed: * No surgery found *  Anesthes  Patient location:   Post pain:   Post assessment:   Last Vitals:  Vitals:   11/22/24 1621  BP: 110/73  Pulse: 89     Post vital signs:   Level of consciousness:   Complications: . Associated Signs/Symptoms: Depression Symptoms:  hopelessness, (Hypo) Manic Symptoms:   Anxiety Symptoms:   Psychotic Symptoms:   PTSD Symptoms:   Past Psychiatric History: The patient was seen in a psychiatric office and takes multiple medications she's not been recently hospitalized.  Previous Psychotropic Medications: Yes   Substance Abuse History in the last 12 months:  No.  Consequences of Substance Abuse: Negative  Past Medical History:  Past Medical History:  Diagnosis Date   Anxiety 11-16-89   Asthma 11-16-88   Bipolar disorder (HCC) 05/21/2014   Chronic pain syndrome 01/07/2011   COPD (chronic obstructive pulmonary disease) (HCC) 11-17-19   Diabetes mellitus without complication (HCC) 11-16-2020   Fibromyalgia syndrome    GERD (gastroesophageal  reflux disease) 11-16-92   Gout    Hip dysplasia, congenital 09/15/2013   History of kidney stones    Hypercholesterolemia    Multiple sclerosis    Osteoporosis    Psoriasis    Seasonal allergies    Sleep apnea 2012   sleep study / slight, no interventions   Vitamin B 12 deficiency     Past Surgical History:  Procedure Laterality Date   CATARACT EXTRACTION W/PHACO Left 05/21/2015   Procedure: CATARACT EXTRACTION PHACO AND INTRAOCULAR LENS PLACEMENT (IOC);  Surgeon: Elsie Carmine, MD;  Location: [facility];  Service: Ophthalmology;  Laterality: Left;  US  00:35 AP% 22.9 CDE 8.11 fluid pack lot #[id number]   CATARACT EXTRACTION W/PHACO Right 06/04/2015   Procedure: CATARACT EXTRACTION PHACO AND INTRAOCULAR LENS PLACEMENT (IOC);  Surgeon: Elsie Carmine, MD;  Location: [facility];  Service: Ophthalmology;  Laterality: Right;  US :00:48 AP%: 10.5 CDE:5.08 Fluid lot #[id number]   CYSTOSCOPY/URETEROSCOPY/HOLMIUM LASER/STENT PLACEMENT Bilateral 09/22/2016   Procedure: CYSTOSCOPY/URETEROSCOPY/HOLMIUM LASER/STENT PLACEMENT;  Surgeon: Rosina Riis, MD;  Location: [facility];  Service: Urology;  Laterality: Bilateral;   EYE SURGERY  2015 and 2016   tissue biopsy   FOOT SURGERY  11/16/2013   JOINT REPLACEMENT Left 2013 and 2022   hip replacement   LITHOTRIPSY     PTOSIS REPAIR Bilateral 02/18/2016   Procedure: BILATERAL PTOSIS REPAIR UPPER EYELIDS;  Surgeon: Greig CHRISTELLA Gay, MD;  Location: [facility];  Service: Ophthalmology;  Laterality: Bilateral;  LEAVE PT EARLY  AM   thumb surgery Right    TONSILLECTOMY  11/17/1971   TOTAL HIP ARTHROPLASTY Right 07/02/2021   Procedure: TOTAL HIP ARTHROPLASTY ANTERIOR APPROACH;  Surgeon: Melodi Lerner, MD;  Location: [facility];  Service: Orthopedics;  Laterality: Right;    Family Psychiatric History:   Family History:  Family History  Problem Relation Age of Onset   Cancer Mother    Heart disease Mother    Diabetes Mother    Sleep apnea Mother     Hyperlipidemia Mother    Cancer Father        Abdomen with mastasis   High Cholesterol Father    Early death Father    Heart disease Maternal Grandfather    Diabetes Maternal Grandmother    Cancer Paternal Grandmother    Early death Paternal Grandmother    Arthritis Sister    COPD Sister    Early death Sister    Heart disease Maternal Uncle    Kidney disease Neg Hx    Bladder Cancer Neg Hx    Prostate cancer Neg Hx    Kidney cancer Neg Hx     Social History:   Social History   Socioeconomic History   Marital status: Divorced    Spouse name: Not on file   Number of children: 1   Years of education: Not on file   Highest education level: Bachelor's degree (e.g., BA, AB, BS)  Occupational History   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   Occupation: Disability  Tobacco Use   Smoking status: Former    Current packs/day: 0.00    Average packs/day: 1 pack/day for 25.0 years (25.0 ttl pk-yrs)    Types: Cigarettes    Start date: 36    Quit date: 2019    Years since quitting: 7.0    Passive exposure: Past   Smokeless tobacco: Never   Tobacco comments:    occasional use  Vaping Use   Vaping status: Former  Substance and Sexual Activity   Alcohol  use: No    Alcohol /week: 0.0 standard drinks of alcohol    Drug use: Yes    Types: Methylphenidate , Marijuana    Comment: Delta 8 - once per day   Sexual activity: Never  Other Topics Concern   Not on file  Social History Narrative   Not on file   Social Drivers of Health   Tobacco Use: Medium Risk (11/22/2024)   Patient History    Smoking Tobacco Use: Former    Smokeless Tobacco Use: Never    Passive Exposure: Past  Physicist, Medical Strain: High Risk (12/21/2023)   Overall Financial Resource Strain (CARDIA)    Difficulty of Paying Living Expenses: Hard  Food Insecurity: No Food Insecurity (07/28/2024)   Received from [facility]   Epic    Within the past 12 months, you worried that  your food would run out before you got the money to buy more.: Never true    Within the past 12 months, the food you bought just didn't last and you didn't have money to get more.: Never true  Transportation Needs: No Transportation Needs (07/28/2024)   Received from [facility]   PRAPARE - Transportation    Lack of Transportation (Medical): No    Lack of Transportation (Non-Medical): No  Physical Activity: Inactive (12/21/2023)   Exercise Vital Sign    Days of Exercise per Week: 0 days    Minutes of Exercise per Session: 0 min  Stress: Stress Concern Present (12/21/2023)   [employer] of Occupational Health - Occupational Stress Questionnaire    Feeling of Stress : To some extent  Social Connections: Moderately Isolated (12/21/2023)   Social Connection and Isolation Panel    Frequency of Communication with Friends and Family: Three times a week    Frequency of Social Gatherings with Friends and Family: Never    Attends Religious Services: 1 to 4 times per year    Active Member of Clubs or Organizations: No    Attends Banker Meetings: Never    Marital Status: Divorced  Depression (PHQ2-9): High Risk (10/09/2024)   Depression (PHQ2-9)    PHQ-2 Score: 21  Alcohol  Screen: Low Risk (10/01/2022)   Alcohol  Screen    Last Alcohol  Screening Score (AUDIT): 0  Housing: Unknown (05/20/2024)   Received from [facility]   Epic    Unable to Pay for Housing in the Last Year: Not on file    Number of Times Moved in the Last Year: Not on file    At any time in the past 12 months, were you homeless or living in a shelter (including now)?: No  Utilities: Low Risk (07/28/2024)   Received from [facility]   Utilities    Within the past 12 months, have you been unable to get utilities(heat, electricity) when it was really needed?: No  Health Literacy: Adequate Health Literacy (10/28/2023)   B1300 Health Literacy    Frequency of need for help with medical  instructions: Never    Additional Social History:   Allergies:   Allergies  Allergen Reactions   Albuterol  Other (See Comments)    Makes pt feel jittery/ tacycardic   Crestor  [Rosuvastatin ] Other (See Comments)    Joint pain, muscle pain, and hair loss   Halcion [Triazolam] Other (See Comments)    Dizziness,headaches,bladder problems   Levaquin  [Levofloxacin  In D5w] Diarrhea and Itching    Shoulder pain   Naproxen Sodium     Patient tolerates in small doses. Her reaction is swelling of the lower extremities.    Ozempic  (0.25 Or 0.5 Mg-Dose) [Semaglutide (0.25 Or 0.5mg -Dos)] Itching   Cefaclor Other (See Comments)    Doesn't remember---unsure if actually allergic    Latex Itching   Prednisone      Elevates blood sugars   Sulfa  Antibiotics Itching    Unsure of reaction possibly itching   Tramadol  Itching and Nausea And Vomiting   Zinc Other (See Comments)    constipation  constipation    Aripiprazole Other (See Comments)    Muscle tension/cramping   Diclofenac  Sodium Rash    made very sick   Iodinated Contrast Media Itching, Other (See Comments), Palpitations and Rash    Metabolic Disorder Labs: Lab Results  Component Value Date   HGBA1C 6.0 (A) 10/09/2024   MPG 142.72 07/02/2021   No results found for: PROLACTIN Lab Results  Component Value Date   CHOL 115 04/03/2024   TRIG 188.0 (H) 04/03/2024   HDL 35.80 (L) 04/03/2024   CHOLHDL 3 04/03/2024   VLDL 37.6 04/03/2024   LDLCALC 41 04/03/2024   LDLCALC 56 03/23/2023     Current Medications: Current Outpatient Medications  Medication Sig Dispense Refill   aspirin  EC 81 MG tablet Take 81 mg by mouth daily. Swallow whole.     Azelastine  HCl 137 MCG/SPRAY SOLN PLACE 2 SPRAYS INTO BOTH NOSTRILS 2 (TWO) TIMES DAILY 30 mL 11   B Complex-Biotin -FA (BIG 100, BIOTIN , PO) Take  300 mg by mouth at bedtime.     BD INTEGRA SYRINGE 25G X 1 3 ML MISC SMARTSIG:1 Syringe(s) Once a Month     Biotin  5 MG TBDP Take 200 mg by  mouth.     bisacodyl  (DULCOLAX) 5 MG EC tablet Take 5 mg by mouth daily as needed for moderate constipation.     cetirizine (ZYRTEC) 10 MG tablet Take 10 mg by mouth daily.     Cholecalciferol  (VITAMIN D3) 250 MCG (10000 UT) capsule Take 3 capsules by mouth at bedtime.     clindamycin  (CLEOCIN  T) 1 % external solution Apply to scalp once or twice a day prn bumps 30 mL 2   clobetasol  cream (TEMOVATE ) 0.05 % Apply 1 Application topically 2 (two) times daily. Apply to aa QD-BID PRN up to two weeks. Then up to 5 days per week as needed. Avoid applying to face, groin, and axilla. Use as directed. Long-term use can cause thinning of the skin. 15 g 1   Continuous Glucose Receiver (FREESTYLE LIBRE 3 READER) DEVI USE AS DIRECTED 1 each 0   Continuous Glucose Receiver (FREESTYLE LIBRE 3 READER) DEVI Use to check blood sugar continuous 1 each 0   Continuous Glucose Sensor (FREESTYLE LIBRE 3 SENSOR) MISC PLACE 1 SENSOR ON THE SKIN EVERY 14 DAYS. USE TO CHECK GLUCOSE CONTINUOUSLY 6 each 3   cyanocobalamin  (VITAMIN B12) 1000 MCG/ML injection INJECT 1 ML (1,000 MCG TOTAL) INTO THE MUSCLE EVERY 30 (THIRTY) DAYS. FOR B12 VITAMIN 3 mL 3   diphenhydrAMINE  (BENADRYL ) 2 % cream Apply topically 3 (three) times daily as needed for itching.     Dulaglutide  (TRULICITY ) 3 MG/0.5ML SOAJ Inject 3 mg into the skin once a week. 6 mL 3   estradiol  (ESTRACE ) 0.1 MG/GM vaginal cream Estrogen Cream Instruction Discard applicator Apply pea sized amount to tip of finger to urethra before bed. Wash hands well after application. Use Monday, Wednesday and Friday 42.5 g 11   Evolocumab  (REPATHA  SURECLICK) 140 MG/ML SOAJ INJECT 140 MG INTO THE SKIN EVERY 14 (FOURTEEN) DAYS. CALL TO SCHEDULE APPT PRIOR TO NEXT REFILL 6 mL 3   fenofibrate  (TRICOR ) 145 MG tablet TAKE 1 TABLET BY MOUTH DAILY FOR CHOLESTEROL *NEED APPOINTMENT 90 tablet 3   Fluticasone -Umeclidin-Vilant (TRELEGY ELLIPTA ) 200-62.5-25 MCG/ACT AEPB Inhale 1 puff into the lungs daily.      hydrOXYzine  (VISTARIL ) 50 MG capsule Take 2 capsules (100 mg total) by mouth at bedtime. TAKE 1 CAPSLE BY MOUTH AT NOON, 1 AT 6 PM, AND 1 AS NEEDED 180 capsule 2   ibuprofen  (ADVIL ) 200 MG tablet Take 800 mg by mouth 2 (two) times daily.     ketoconazole  (NIZORAL ) 2 % shampoo Apply 1 Application topically as directed. Wash from waist up 3 times a week for 2 months, then decrease to 1 time a month, let sit 5 minutes and rinse off 120 mL 6   L-Lysine  HCl 500 MG TABS Take 1,000 mg by mouth.     lamoTRIgine  (LAMICTAL ) 150 MG tablet Take 2 tablets (300 mg total) by mouth daily at bedtime. 180 tablet 5   levalbuterol  (XOPENEX  HFA) 45 MCG/ACT inhaler Inhale 2 puffs into the lungs every 4 (four) hours as needed for wheezing or shortness of breath (or cough). 15 g 12   lidocaine  (LMX) 4 % cream Apply 1 Application topically daily as needed.     metFORMIN  (GLUCOPHAGE ) 500 MG tablet TAKE 2 TABS BY MOUTH EVERY MORNING AND 1 TAB IN THE EVENING 270  tablet 1   methocarbamol  (ROBAXIN ) 500 MG tablet Take 1,500 mg by mouth at bedtime.     metoprolol  tartrate (LOPRESSOR ) 50 MG tablet TAKE 1 TABLET BY MOUTH TWICE A DAY 180 tablet 3   montelukast  (SINGULAIR ) 10 MG tablet TAKE 1 TABLET BY MOUTH EVERYDAY AT BEDTIME 90 tablet 0   mupirocin  ointment (BACTROBAN ) 2 % Apply 1 Application topically daily. Qd to wound on finger 22 g 1   neomycin -bacitracin-polymyxin (NEOSPORIN) [phone] ointment Apply 1 application topically 4 (four) times daily as needed (for cut/scrapes.).     NEOMYCIN -POLYMYXIN-HYDROCORTISONE (CORTISPORIN) 1 % SOLN OTIC solution Apply 1-2 drops to toe BID after soaking 10 mL 1   pantoprazole  (PROTONIX ) 40 MG tablet Take 1 tablet (40 mg total) by mouth 2 (two) times daily before a meal. For heartburn 180 tablet 3   polyvinyl alcohol  (LIQUIFILM TEARS) 1.4 % ophthalmic solution Place 2 drops into both eyes at bedtime.     pregabalin  (LYRICA ) 75 MG capsule Take 1 capsule (75 mg total) by mouth 2 (two) times  daily. 60 capsule 3   Roflumilast  (ZORYVE ) 0.3 % CREA Apply 1 application  topically daily. qd to aa psoriasis on legs, arms 60 g 4   Ruxolitinib Phosphate  (OPZELURA ) 1.5 % CREA Apply to aa's rash QD PRN. 60 g 3   STIOLTO RESPIMAT  2.5-2.5 MCG/ACT AERS Inhale 2 puffs into the lungs at bedtime.     valACYclovir  (VALTREX ) 1000 MG tablet Take 2 tablets by mouth twice daily for 1 day as needed for herpes outbreak. 12 tablet 0   desipramine  (NORPRAMIN ) 25 MG tablet 2  qam 180 tablet 2   DULoxetine  (CYMBALTA ) 20 MG capsule Take 2 capsules (40 mg total) by mouth daily. 60 capsule 5   temazepam  (RESTORIL ) 30 MG capsule Take 2 capsules (60 mg total) by mouth at bedtime as needed for sleep. 60 capsule 0   traZODone  (DESYREL ) 100 MG tablet 3 qhs 90 tablet 4   No current facility-administered medications for this visit.    Neurologic: Headache: No Seizure: No Paresthesias:No  Musculoskeletal: Strength & Muscle Tone: within normal limits Gait & Station: normal Patient leans: N/A  Psychiatric Specialty Exam: ROS  Blood pressure 110/73, pulse 89, height 5' 8 (1.727 m), weight 227 lb (103 kg), last menstrual period 08/23/2014.Body mass index is 34.52 kg/m.  General Appearance: Casual  Eye Contact:  Good  Speech:  Clear and Coherent  Volume:  Normal  Mood:  Negative  Affect:  Appropriate  Thought Process:  Goal Directed  Orientation:  NA  Thought Content:  Logical  Suicidal Thoughts:  No  Homicidal Thoughts:  No  Memory:  Negative  Judgement:  Good  Insight:  Good  Psychomotor Activity:  Normal  Concentration:    Recall:    Fund of Knowledge:Good  Language: Good  Akathisia:  No  Handed:  Right  AIMS (if indicated):    Assets:  Desire for Improvement  ADL's:  Intact  Cognition: WNL  Sleep:      1/7/20264:37 PM    This patient's diagnosis is that of major depression.  She takes Cymbalta  and desipramine  and does well.  She is also on Lamictal .  Patient's second problem is that  of insomnia.  She takes trazodone  and Restoril  and is on a CPAP machine.  At this time I believe she is stable.  She will return to see me in 3 months.

## 2024-11-27 ENCOUNTER — Ambulatory Visit

## 2024-11-29 ENCOUNTER — Ambulatory Visit: Attending: Neurology | Admitting: Occupational Therapy

## 2024-12-07 ENCOUNTER — Other Ambulatory Visit: Payer: Self-pay | Admitting: Neurology

## 2024-12-07 ENCOUNTER — Ambulatory Visit: Admitting: Internal Medicine

## 2024-12-07 DIAGNOSIS — R531 Weakness: Secondary | ICD-10-CM

## 2024-12-07 DIAGNOSIS — G35D Multiple sclerosis, unspecified: Secondary | ICD-10-CM

## 2024-12-07 DIAGNOSIS — R2689 Other abnormalities of gait and mobility: Secondary | ICD-10-CM

## 2024-12-07 DIAGNOSIS — R4689 Other symptoms and signs involving appearance and behavior: Secondary | ICD-10-CM

## 2024-12-11 ENCOUNTER — Ambulatory Visit: Admission: RE | Admit: 2024-12-11 | Source: Ambulatory Visit

## 2024-12-11 ENCOUNTER — Ambulatory Visit

## 2024-12-15 ENCOUNTER — Emergency Department

## 2024-12-15 ENCOUNTER — Inpatient Hospital Stay: Admission: EM | Admit: 2024-12-15 | Discharge: 2024-12-18 | DRG: 690 | Disposition: A

## 2024-12-15 ENCOUNTER — Other Ambulatory Visit: Payer: Self-pay

## 2024-12-15 DIAGNOSIS — G894 Chronic pain syndrome: Secondary | ICD-10-CM | POA: Diagnosis present

## 2024-12-15 DIAGNOSIS — R531 Weakness: Principal | ICD-10-CM

## 2024-12-15 DIAGNOSIS — L42 Pityriasis rosea: Secondary | ICD-10-CM

## 2024-12-15 DIAGNOSIS — E785 Hyperlipidemia, unspecified: Secondary | ICD-10-CM | POA: Diagnosis present

## 2024-12-15 DIAGNOSIS — K219 Gastro-esophageal reflux disease without esophagitis: Secondary | ICD-10-CM | POA: Diagnosis present

## 2024-12-15 DIAGNOSIS — R6510 Systemic inflammatory response syndrome (SIRS) of non-infectious origin without acute organ dysfunction: Secondary | ICD-10-CM

## 2024-12-15 DIAGNOSIS — A419 Sepsis, unspecified organism: Secondary | ICD-10-CM | POA: Diagnosis present

## 2024-12-15 DIAGNOSIS — J3089 Other allergic rhinitis: Secondary | ICD-10-CM | POA: Diagnosis present

## 2024-12-15 DIAGNOSIS — E1159 Type 2 diabetes mellitus with other circulatory complications: Principal | ICD-10-CM

## 2024-12-15 DIAGNOSIS — F319 Bipolar disorder, unspecified: Secondary | ICD-10-CM | POA: Diagnosis present

## 2024-12-15 DIAGNOSIS — R059 Cough, unspecified: Secondary | ICD-10-CM | POA: Diagnosis present

## 2024-12-15 DIAGNOSIS — J4489 Other specified chronic obstructive pulmonary disease: Secondary | ICD-10-CM | POA: Diagnosis present

## 2024-12-15 LAB — CBC
HCT: 33.1 % — ABNORMAL LOW (ref 36.0–46.0)
HCT: 31.1 % — ABNORMAL LOW (ref 36.0–46.0)
Hemoglobin: 10.8 g/dL — ABNORMAL LOW (ref 12.0–15.0)
Hemoglobin: 10.5 g/dL — ABNORMAL LOW (ref 12.0–15.0)
MCH: 30 pg (ref 26.0–34.0)
MCH: 30.8 pg (ref 26.0–34.0)
MCHC: 32.6 g/dL (ref 30.0–36.0)
MCHC: 33.8 g/dL (ref 30.0–36.0)
MCV: 91.9 fL (ref 80.0–100.0)
MCV: 91.2 fL (ref 80.0–100.0)
Platelets: 295 10*3/uL (ref 150–400)
Platelets: 303 10*3/uL (ref 150–400)
RBC: 3.6 MIL/uL — ABNORMAL LOW (ref 3.87–5.11)
RBC: 3.41 MIL/uL — ABNORMAL LOW (ref 3.87–5.11)
RDW: 13.5 % (ref 11.5–15.5)
RDW: 13.7 % (ref 11.5–15.5)
WBC: 12.5 10*3/uL — ABNORMAL HIGH (ref 4.0–10.5)
WBC: 12.5 10*3/uL — ABNORMAL HIGH (ref 4.0–10.5)
nRBC: 0 % (ref 0.0–0.2)
nRBC: 0 % (ref 0.0–0.2)

## 2024-12-15 LAB — URINALYSIS, ROUTINE W REFLEX MICROSCOPIC
Bilirubin Urine: NEGATIVE
Glucose, UA: NEGATIVE mg/dL
Hgb urine dipstick: NEGATIVE
Ketones, ur: NEGATIVE mg/dL
Nitrite: NEGATIVE
Protein, ur: 100 mg/dL — AB
RBC / HPF: 0 RBC/hpf (ref 0–5)
Specific Gravity, Urine: 1.019 (ref 1.005–1.030)
WBC, UA: >50 WBC/hpf (ref 0–5)
pH: 5 (ref 5.0–8.0)

## 2024-12-15 LAB — TROPONIN T, HIGH SENSITIVITY: Troponin T High Sensitivity: 8 ng/L (ref 0–19)

## 2024-12-15 LAB — PROCALCITONIN: Procalcitonin: 0.5 ng/mL

## 2024-12-15 LAB — CREATININE, SERUM
Creatinine, Ser: 0.89 mg/dL (ref 0.44–1.00)
GFR, Estimated: >60 mL/min

## 2024-12-15 LAB — COMPREHENSIVE METABOLIC PANEL WITH GFR
ALT: 15 U/L (ref 0–44)
AST: 16 U/L (ref 15–41)
Albumin: 3.8 g/dL (ref 3.5–5.0)
Alkaline Phosphatase: 40 U/L (ref 38–126)
Anion gap: 10 (ref 5–15)
BUN: 18 mg/dL (ref 8–23)
CO2: 25 mmol/L (ref 22–32)
Calcium: 8.4 mg/dL — ABNORMAL LOW (ref 8.9–10.3)
Chloride: 101 mmol/L (ref 98–111)
Creatinine, Ser: 1.06 mg/dL — ABNORMAL HIGH (ref 0.44–1.00)
GFR, Estimated: 59 mL/min — ABNORMAL LOW
Glucose, Bld: 178 mg/dL — ABNORMAL HIGH (ref 70–99)
Potassium: 3.6 mmol/L (ref 3.5–5.1)
Sodium: 136 mmol/L (ref 135–145)
Total Bilirubin: 0.7 mg/dL (ref 0.0–1.2)
Total Protein: 6.4 g/dL — ABNORMAL LOW (ref 6.5–8.1)

## 2024-12-15 LAB — RESP PANEL BY RT-PCR (RSV, FLU A&B, COVID)  RVPGX2
Influenza A by PCR: NEGATIVE
Influenza B by PCR: NEGATIVE
Resp Syncytial Virus by PCR: NEGATIVE
SARS Coronavirus 2 by RT PCR: NEGATIVE

## 2024-12-15 LAB — CK: Total CK: 85 U/L (ref 38–234)

## 2024-12-15 LAB — CBG MONITORING, ED: Glucose-Capillary: 151 mg/dL — ABNORMAL HIGH (ref 70–99)

## 2024-12-15 LAB — LACTIC ACID, PLASMA: Lactic Acid, Venous: 1.1 mmol/L (ref 0.5–1.9)

## 2024-12-15 LAB — GLUCOSE, CAPILLARY: Glucose-Capillary: 136 mg/dL — ABNORMAL HIGH (ref 70–99)

## 2024-12-15 MED ORDER — SODIUM CHLORIDE 0.9 % IV SOLN
2.0000 g | Freq: Once | INTRAVENOUS | Status: AC
  Administered 2024-12-15: 2 g via INTRAVENOUS
  Filled 2024-12-15: qty 20

## 2024-12-15 MED ORDER — PROCHLORPERAZINE EDISYLATE 10 MG/2ML IJ SOLN
10.0000 mg | Freq: Four times a day (QID) | INTRAMUSCULAR | Status: DC | PRN
  Administered 2024-12-16: 10 mg via INTRAVENOUS
  Filled 2024-12-15: qty 2

## 2024-12-15 MED ORDER — ROFLUMILAST 0.3 % EX CREA: 1.0000 "application " | TOPICAL_CREAM | Freq: Every day | CUTANEOUS | Status: DC

## 2024-12-15 MED ORDER — DULOXETINE HCL 20 MG PO CPEP
40.0000 mg | ORAL_CAPSULE | Freq: Every day | ORAL | Status: DC
  Administered 2024-12-16 – 2024-12-18 (×3): 40 mg via ORAL
  Filled 2024-12-15 (×3): qty 2

## 2024-12-15 MED ORDER — TEMAZEPAM 7.5 MG PO CAPS: 30.0000 mg | ORAL_CAPSULE | Freq: Every evening | ORAL | Status: DC | PRN

## 2024-12-15 MED ORDER — LAMOTRIGINE 100 MG PO TABS
300.0000 mg | ORAL_TABLET | Freq: Every day | ORAL | Status: DC
  Administered 2024-12-15 – 2024-12-17 (×3): 300 mg via ORAL
  Filled 2024-12-15 (×3): qty 3

## 2024-12-15 MED ORDER — INSULIN ASPART 100 UNIT/ML IJ SOLN: 0.0000 [IU] | Freq: Three times a day (TID) | INTRAMUSCULAR | Status: DC

## 2024-12-15 MED ORDER — LEVALBUTEROL HCL 0.63 MG/3ML IN NEBU: 0.6300 mg | INHALATION_SOLUTION | RESPIRATORY_TRACT | Status: DC | PRN

## 2024-12-15 MED ORDER — SODIUM CHLORIDE 0.9 % IV BOLUS
1000.0000 mL | Freq: Once | INTRAVENOUS | Status: AC
  Administered 2024-12-15: 1000 mL via INTRAVENOUS

## 2024-12-15 MED ORDER — MONTELUKAST SODIUM 10 MG PO TABS
10.0000 mg | ORAL_TABLET | Freq: Every day | ORAL | Status: DC
  Administered 2024-12-15 – 2024-12-17 (×3): 10 mg via ORAL
  Filled 2024-12-15 (×3): qty 1

## 2024-12-15 MED ORDER — ENOXAPARIN SODIUM 40 MG/0.4ML IJ SOSY: 40.0000 mg | PREFILLED_SYRINGE | INTRAMUSCULAR | Status: DC

## 2024-12-15 MED ORDER — SODIUM CHLORIDE 0.9 % IV SOLN: INTRAVENOUS | Status: DC

## 2024-12-15 MED ORDER — LORATADINE 10 MG PO TABS
10.0000 mg | ORAL_TABLET | Freq: Every day | ORAL | Status: DC
  Administered 2024-12-16 – 2024-12-18 (×3): 10 mg via ORAL
  Filled 2024-12-15 (×3): qty 1

## 2024-12-15 MED ORDER — SODIUM CHLORIDE 0.9 % IV SOLN
1.0000 g | Freq: Three times a day (TID) | INTRAVENOUS | Status: DC
  Administered 2024-12-16 – 2024-12-18 (×8): 1 g via INTRAVENOUS
  Filled 2024-12-15 (×10): qty 20

## 2024-12-15 MED ORDER — LAMOTRIGINE 25 MG PO TABS: 150.0000 mg | ORAL_TABLET | Freq: Every day | ORAL | Status: DC

## 2024-12-15 MED ORDER — INSULIN ASPART 100 UNIT/ML IJ SOLN
0.0000 [IU] | Freq: Three times a day (TID) | INTRAMUSCULAR | Status: DC
  Administered 2024-12-16: 1 [IU] via SUBCUTANEOUS
  Administered 2024-12-16 (×2): 2 [IU] via SUBCUTANEOUS
  Administered 2024-12-17 (×2): 1 [IU] via SUBCUTANEOUS
  Filled 2024-12-15: qty 1
  Filled 2024-12-15 (×2): qty 2
  Filled 2024-12-15: qty 1
  Filled 2024-12-15: qty 2

## 2024-12-15 MED ORDER — ACETAMINOPHEN 325 MG PO TABS
650.0000 mg | ORAL_TABLET | Freq: Once | ORAL | Status: AC | PRN
  Administered 2024-12-15: 650 mg via ORAL
  Filled 2024-12-15: qty 2

## 2024-12-15 MED ORDER — BUDESON-GLYCOPYRROL-FORMOTEROL 160-9-4.8 MCG/ACT IN AERO
2.0000 | INHALATION_SPRAY | Freq: Two times a day (BID) | RESPIRATORY_TRACT | Status: DC
  Administered 2024-12-15 – 2024-12-18 (×6): 2 via RESPIRATORY_TRACT
  Filled 2024-12-15: qty 5.9

## 2024-12-15 MED ORDER — LEVALBUTEROL TARTRATE 45 MCG/ACT IN AERO: 2.0000 | INHALATION_SPRAY | RESPIRATORY_TRACT | Status: DC | PRN

## 2024-12-15 MED ORDER — IPRATROPIUM BROMIDE 0.02 % IN SOLN
0.5000 mg | Freq: Four times a day (QID) | RESPIRATORY_TRACT | Status: DC | PRN
  Administered 2024-12-15: 0.5 mg via RESPIRATORY_TRACT
  Filled 2024-12-15: qty 2.5

## 2024-12-15 MED ORDER — ACETAMINOPHEN 650 MG RE SUPP: 650.0000 mg | Freq: Four times a day (QID) | RECTAL | Status: DC | PRN

## 2024-12-15 MED ORDER — ACETAMINOPHEN 325 MG PO TABS
650.0000 mg | ORAL_TABLET | Freq: Four times a day (QID) | ORAL | Status: DC | PRN
  Administered 2024-12-15 – 2024-12-17 (×4): 650 mg via ORAL
  Filled 2024-12-15 (×4): qty 2

## 2024-12-15 MED ORDER — ASPIRIN 81 MG PO TBEC
81.0000 mg | DELAYED_RELEASE_TABLET | Freq: Every day | ORAL | Status: DC
  Administered 2024-12-16 – 2024-12-18 (×3): 81 mg via ORAL
  Filled 2024-12-15 (×3): qty 1

## 2024-12-15 MED ORDER — PANTOPRAZOLE SODIUM 40 MG PO TBEC
40.0000 mg | DELAYED_RELEASE_TABLET | Freq: Two times a day (BID) | ORAL | Status: DC
  Administered 2024-12-16 – 2024-12-18 (×5): 40 mg via ORAL
  Filled 2024-12-15 (×5): qty 1

## 2024-12-15 MED ORDER — ENOXAPARIN SODIUM 60 MG/0.6ML IJ SOSY
50.0000 mg | PREFILLED_SYRINGE | INTRAMUSCULAR | Status: DC
  Administered 2024-12-15 – 2024-12-17 (×3): 50 mg via SUBCUTANEOUS
  Filled 2024-12-15 (×3): qty 0.6

## 2024-12-15 MED ORDER — TRAZODONE HCL 100 MG PO TABS
100.0000 mg | ORAL_TABLET | Freq: Every day | ORAL | Status: DC
  Administered 2024-12-15 – 2024-12-17 (×3): 100 mg via ORAL
  Filled 2024-12-15 (×3): qty 1

## 2024-12-15 MED ORDER — FENOFIBRATE 160 MG PO TABS
160.0000 mg | ORAL_TABLET | Freq: Every day | ORAL | Status: DC
  Administered 2024-12-16 – 2024-12-18 (×3): 160 mg via ORAL
  Filled 2024-12-15 (×3): qty 1

## 2024-12-15 NOTE — ED Notes (Signed)
 Assisted pt to the bathroom and returned pt back to the hall bed assignment. Pt was given warm blanket. Pt was given hospital socks.

## 2024-12-15 NOTE — ED Notes (Signed)
Patient placed on 2L NC supplemental oxygen.

## 2024-12-15 NOTE — ED Notes (Signed)
 Pt provided food and drink per request. States no other needs at this time.

## 2024-12-15 NOTE — ED Provider Notes (Signed)
 "  [facility] Provider Note    Event Date/Time   First MD Initiated Contact with Patient 12/15/24 1544     (approximate)   History   Fall   HPI  Emily Moon is a 64 y.o. female who presents to the ED for evaluation of Fall   Review of neurology telemedicine note from 1/21.  History of possible MS with MRI findings suggestive of this though various diagnostics for mimickers pending.  Otherwise history of DM, HLD, bipolar disorder chronic urinary difficulty (reported straining and having to force urination).  Patient presents with increased weakness from her baseline.  Reports multiple falls in the past day without injury.  Much of her history is provided by her sister at the bedside reports that she is weaker than normal.  Some confusion at baseline but seems worse in the setting of this weakness   Physical Exam   Triage Vital Signs: ED Triage Vitals  Encounter Vitals Group     BP 12/15/24 1147 95/66     Girls Systolic BP Percentile --      Girls Diastolic BP Percentile --      Boys Systolic BP Percentile --      Boys Diastolic BP Percentile --      Pulse Rate 12/15/24 1147 96     Resp 12/15/24 1147 16     Temp 12/15/24 1147 (!) 100.4 F (38 C)     Temp src --      SpO2 12/15/24 1147 92 %     Weight 12/15/24 1149 228 lb 6.3 oz (103.6 kg)     Height 12/15/24 1149 5' 8 (1.727 m)     Head Circumference --      Peak Flow --      Pain Score 12/15/24 1148 0     Pain Loc --      Pain Education --      Exclude from Growth Chart --     Most recent vital signs: Vitals:   12/15/24 1617 12/15/24 2128  BP: 126/61 129/73  Pulse: 86 (!) 118  Resp: 18 20  Temp: 98.3 F (36.8 C) (!) 102.4 F (39.1 C)  SpO2: 94% 99%    General: Awake, no distress.  Somewhat encephalopathic, repeating questions multiple times despite my answers. CV:  Good peripheral perfusion.  Resp:  Normal effort.  Abd:  No distention.  Soft, mild suprapubic  tenderness. MSK:  No deformity noted.  Neuro:  No focal deficits appreciated.  Nonfocal exam Other:     ED Results / Procedures / Treatments   Labs (all labs ordered are listed, but only abnormal results are displayed) Labs Reviewed  COMPREHENSIVE METABOLIC PANEL WITH GFR - Abnormal; Notable for the following components:      Result Value   Glucose, Bld 178 (*)    Creatinine, Ser 1.06 (*)    Calcium  8.4 (*)    Total Protein 6.4 (*)    GFR, Estimated 59 (*)    All other components within normal limits  CBC - Abnormal; Notable for the following components:   WBC 12.5 (*)    RBC 3.60 (*)    Hemoglobin 10.8 (*)    HCT 33.1 (*)    All other components within normal limits  URINALYSIS, ROUTINE W REFLEX MICROSCOPIC - Abnormal; Notable for the following components:   Color, Urine AMBER (*)    APPearance CLOUDY (*)    Protein, ur 100 (*)    Leukocytes,Ua LARGE (*)  Bacteria, UA MANY (*)    Non Squamous Epithelial PRESENT (*)    All other components within normal limits  CBG MONITORING, ED - Abnormal; Notable for the following components:   Glucose-Capillary 151 (*)    All other components within normal limits  RESP PANEL BY RT-PCR (RSV, FLU A&B, COVID)  RVPGX2  CULTURE, BLOOD (ROUTINE X 2)  CULTURE, BLOOD (ROUTINE X 2)  URINE CULTURE  CK  LACTIC ACID, PLASMA  PROCALCITONIN  HIV ANTIBODY (ROUTINE TESTING W REFLEX)  CBC  CREATININE, SERUM  CBC  BASIC METABOLIC PANEL WITH GFR  TROPONIN T, HIGH SENSITIVITY    EKG Sinus rhythm with a rate of 97 bpm, normal axis and intervals.  Nonspecific ST changes with subtle ST depressions laterally and inferiorly.  No STEMI.  Fairly similar morphology as previous EKG from July though these ST depressions are somewhat more prominent today  RADIOLOGY CT head interpreted by me without evidence of acute intracranial pathology CT cervical spine interpreted by me without evidence of fracture or dislocation Plain film of the left ankle  interpreted by me without evidence of fracture or dislocation  Official radiology report(s): DG Chest Portable 1 View Result Date: 12/15/2024 EXAM: 1 VIEW(S) XRAY OF THE CHEST 12/15/2024 04:32:00 PM COMPARISON: Comparison 12/24/2021. CLINICAL HISTORY: Sepsis screen. FINDINGS: LUNGS AND PLEURA: Linear atelectasis versus scarring in left mid lung. No pleural effusion. No pneumothorax. HEART AND MEDIASTINUM: Mild cardiomegaly. No acute abnormality of the mediastinal silhouette. BONES AND SOFT TISSUES: No acute osseous abnormality. IMPRESSION: 1. Linear left mid lung atelectasis or scarring. Electronically signed by: Dayne Hassell MD 12/15/2024 04:53 PM EST RP Workstation: [id number]   DG Ankle Complete Left Result Date: 12/15/2024 EXAM: 3 OR MORE VIEW(S) XRAY OF THE LEFT ANKLE 12/15/2024 12:55:00 PM CLINICAL HISTORY: Fall. COMPARISON: None available. FINDINGS: BONES AND JOINTS: No acute fracture. No malalignment. SOFT TISSUES: Unremarkable. IMPRESSION: 1. No evidence of acute traumatic injury. Electronically signed by: Donnice Mania MD 12/15/2024 01:43 PM EST RP Workstation: [id number]   CT Cervical Spine Wo Contrast Result Date: 12/15/2024 EXAM: CT CERVICAL SPINE WITHOUT CONTRAST 12/15/2024 01:19:03 PM TECHNIQUE: CT of the cervical spine was performed without the administration of intravenous contrast. Multiplanar reformatted images are provided for review. Automated exposure control, iterative reconstruction, and/or weight based adjustment of the mA/kV was utilized to reduce the radiation dose to as low as reasonably achievable. COMPARISON: MRI of the cervical spine 02/23/2020. CLINICAL HISTORY: Polytrauma, blunt. FINDINGS: BONES AND ALIGNMENT: Reversal of the normal cervical lordosis. No evidence of traumatic malalignment. No acute fracture. DEGENERATIVE CHANGES: Disc space narrowing is most pronounced at C4-C5. There is no high grade osseous spinal canal stenosis. Facet arthrosis and no uncovertebral  hypertrophy at multiple levels. Foraminal stenosis at multiple levels, most pronounced on the right at C3-C4 and bilaterally at C4-C5 and C5-C6. SOFT TISSUES: No prevertebral soft tissue swelling. IMPRESSION: 1. No evidence of acute traumatic injury. Electronically signed by: Donnice Mania MD 12/15/2024 01:35 PM EST RP Workstation: [id number]   CT Head Wo Contrast Result Date: 12/15/2024 EXAM: CT HEAD WITHOUT CONTRAST 12/15/2024 01:19:03 PM TECHNIQUE: CT of the head was performed without the administration of intravenous contrast. Automated exposure control, iterative reconstruction, and/or weight based adjustment of the mA/kV was utilized to reduce the radiation dose to as low as reasonably achievable. COMPARISON: 08/28/2024 CLINICAL HISTORY: Mental status change, unknown cause. FINDINGS: BRAIN AND VENTRICLES: No acute hemorrhage. No evidence of acute infarct. No hydrocephalus. No extra-axial collection. No mass effect or midline shift. Hypoattenuation  in the periventricular white matter suggestive of mild chronic microvascular ischemic changes. ORBITS: Bilateral lens replacement. SINUSES: No acute abnormality. SOFT TISSUES AND SKULL: No acute soft tissue abnormality. No skull fracture. IMPRESSION: 1. No acute intracranial abnormality. Electronically signed by: Donnice Mania MD 12/15/2024 01:32 PM EST RP Workstation: [id number]    PROCEDURES and INTERVENTIONS:  .Critical Care  Performed by: Claudene Rover, MD Authorized by: Claudene Rover, MD   Critical care provider statement:    Critical care time (minutes):  30   Critical care time was exclusive of:  Separately billable procedures and treating other patients   Critical care was necessary to treat or prevent imminent or life-threatening deterioration of the following conditions:  Sepsis   Critical care was time spent personally by me on the following activities:  Development of treatment plan with patient or surrogate, discussions with consultants,  evaluation of patient's response to treatment, examination of patient, ordering and review of laboratory studies, ordering and review of radiographic studies, ordering and performing treatments and interventions, pulse oximetry, re-evaluation of patient's condition and review of old charts .1-3 Lead EKG Interpretation  Performed by: Claudene Rover, MD Authorized by: Claudene Rover, MD     Interpretation: abnormal     ECG rate:  110   ECG rate assessment: tachycardic     Rhythm: sinus tachycardia     Ectopy: none     Conduction: normal     Medications  acetaminophen  (TYLENOL ) tablet 650 mg (has no administration in time range)    Or  acetaminophen  (TYLENOL ) suppository 650 mg (has no administration in time range)  ipratropium (ATROVENT ) nebulizer solution 0.5 mg (has no administration in time range)  prochlorperazine  (COMPAZINE ) injection 10 mg (has no administration in time range)  enoxaparin  (LOVENOX ) injection 50 mg (has no administration in time range)  aspirin  EC tablet 81 mg (has no administration in time range)  fenofibrate  tablet 160 mg (has no administration in time range)  DULoxetine  (CYMBALTA ) DR capsule 40 mg (has no administration in time range)  pantoprazole  (PROTONIX ) EC tablet 40 mg (has no administration in time range)  loratadine  (CLARITIN ) tablet 10 mg (has no administration in time range)  budesonide -glycopyrrolate -formoterol  (BREZTRI ) 160-9-4.8 MCG/ACT inhaler 2 puff (has no administration in time range)  levalbuterol  (XOPENEX  HFA) inhaler 2 puff (has no administration in time range)  montelukast  (SINGULAIR ) tablet 10 mg (has no administration in time range)  Roflumilast  0.3 % CREA 1 application  (has no administration in time range)  meropenem  (MERREM ) 1 g in sodium chloride  0.9 % 100 mL IVPB (has no administration in time range)  insulin  aspart (novoLOG ) injection 0-9 Units (has no administration in time range)  acetaminophen  (TYLENOL ) tablet 650 mg (650 mg Oral Given  12/15/24 1200)  sodium chloride  0.9 % bolus 1,000 mL (0 mLs Intravenous Stopped 12/15/24 1937)  cefTRIAXone  (ROCEPHIN ) 2 g in sodium chloride  0.9 % 100 mL IVPB (0 g Intravenous Stopped 12/15/24 1922)     IMPRESSION / MDM / ASSESSMENT AND PLAN / ED COURSE  I reviewed the triage vital signs and the nursing notes.  Differential diagnosis includes, but is not limited to, sepsis, UTI, pneumonia, viral syndrome, MS flare, stroke  {Patient presents with symptoms of an acute illness or injury that is potentially life-threatening.  Patient presents with signs of sepsis from a UTI requiring medical admission.  Leukocytosis, febrile tachycardic rest risk right ear reviewed.  No shock or instability.  Urine infectious and sent for culture, blood cultures drawn.  MRI brain  pending to assess for signs of MS flare though she has a nonfocal exam.  Strength intact for me.  Otherwise reassuring imaging.  Consult medicine for admission.  Clinical Course as of 12/15/24 2137  Fri Dec 15, 2024  1901 I consulted medicine who agrees to admit [DS]    Clinical Course User Index [DS] Claudene Rover, MD     FINAL CLINICAL IMPRESSION(S) / ED DIAGNOSES   Final diagnoses:  Gastroesophageal reflux disease  COPD with asthma (HCC)  Pityriasis rosea     Rx / DC Orders   ED Discharge Orders     None        Note:  This document was prepared using Dragon voice recognition software and may include unintentional dictation errors.   Claudene Rover, MD 12/15/24 2138  "

## 2024-12-15 NOTE — Progress Notes (Signed)
 PHARMACIST - PHYSICIAN COMMUNICATION  CONCERNING:  Enoxaparin  (Lovenox ) for DVT Prophylaxis    RECOMMENDATION: Patient was prescribed enoxaprin 40mg  q24 hours for VTE prophylaxis.   Filed Weights   12/15/24 1149  Weight: 103.6 kg (228 lb 6.3 oz)    Body mass index is 34.73 kg/m.  Estimated Creatinine Clearance: 69.3 mL/min (A) (by C-G formula based on SCr of 1.06 mg/dL (H)).   Based on [facility] policy patient is candidate for enoxaparin  0.5mg /kg TBW SQ every 24 hours based on BMI being >30.   DESCRIPTION: Pharmacy has adjusted enoxaparin  dose per [facility] Fall policy.  Patient is now receiving enoxaparin  0.5 mg/kg every 24 hours    Emily Moon, PharmD Clinical Pharmacist  12/15/2024 7:54 PM

## 2024-12-15 NOTE — ED Notes (Signed)
 Coming [facility] from home. EMS called out for stroke; stroke screen negative. Reports 9 falls in the last 12 hours from weakness. No head injury, no LOC. Reports increased weakness today; fallen twice today. Refused transport from EMS. Hx: hypotension, DM, MS, COPD. Jaundiced per EMS.  84/49 (61), 90% on RA, 97% on 3L, CBG 225, HR 99, unable to get a temp; reports 101.2 last night. RR 20-24, CO2 37.

## 2024-12-15 NOTE — Progress Notes (Signed)
" °   12/15/24 2149  Assess: if the MEWS score is Yellow or Red  Were vital signs accurate and taken at a resting state? Yes  Does the patient meet 2 or more of the SIRS criteria? Yes  Does the patient have a confirmed or suspected source of infection? Yes  MEWS guidelines implemented  Yes, red  Treat  MEWS Interventions Considered administering scheduled or prn medications/treatments as ordered  Take Vital Signs  Increase Vital Sign Frequency  Red: Q1hr x2, continue Q4hrs until patient remains green for 12hrs  Escalate  MEWS: Escalate Red: Discuss with charge nurse and notify provider. Consider notifying RRT. If remains red for 2 hours consider need for higher level of care  Notify: Charge Nurse/RN  Name of Charge Nurse/RN Notified 2128, [place] RN  Provider Notification  Provider Name/Title Dr Mannie  Date Provider Notified 12/15/24  Time Provider Notified 2149  Method of Notification Page  Notification Reason Other (Comment) (Red muse)  Provider response See new orders  Date of Provider Response 12/15/24  Time of Provider Response 2152    "

## 2024-12-15 NOTE — ED Triage Notes (Addendum)
 Patient to ED via [facility] from home. Patient states she fell in her bedroom today after experiencing weakness. Patient states she laid on the floor for 2 hours before being able to call EMS. Patient expresses multiple falls recently. Patient denies LOC or hitting her head. Patient does state when she fell she landed on her nightstand but is unsure where she may have been injured on her body. She does express L ankle pain and has notable abrasions/bruising to the L ankle. Patient is currently A&Ox4. Patient states she lives home alone. Patient does have slurred speech. Patient states her symptoms started a couple weeks ago. Patient does have Hx of MS.

## 2024-12-16 ENCOUNTER — Inpatient Hospital Stay

## 2024-12-16 LAB — CBC
HCT: 31.9 % — ABNORMAL LOW (ref 36.0–46.0)
Hemoglobin: 10.7 g/dL — ABNORMAL LOW (ref 12.0–15.0)
MCH: 30.8 pg (ref 26.0–34.0)
MCHC: 33.5 g/dL (ref 30.0–36.0)
MCV: 91.9 fL (ref 80.0–100.0)
Platelets: 316 10*3/uL (ref 150–400)
RBC: 3.47 MIL/uL — ABNORMAL LOW (ref 3.87–5.11)
RDW: 13.7 % (ref 11.5–15.5)
WBC: 11.5 10*3/uL — ABNORMAL HIGH (ref 4.0–10.5)
nRBC: 0 % (ref 0.0–0.2)

## 2024-12-16 LAB — BASIC METABOLIC PANEL WITH GFR
Anion gap: 15 (ref 5–15)
BUN: 10 mg/dL (ref 8–23)
CO2: 24 mmol/L (ref 22–32)
Calcium: 8.3 mg/dL — ABNORMAL LOW (ref 8.9–10.3)
Chloride: 100 mmol/L (ref 98–111)
Creatinine, Ser: 0.75 mg/dL (ref 0.44–1.00)
GFR, Estimated: >60 mL/min
Glucose, Bld: 143 mg/dL — ABNORMAL HIGH (ref 70–99)
Potassium: 3.2 mmol/L — ABNORMAL LOW (ref 3.5–5.1)
Sodium: 138 mmol/L (ref 135–145)

## 2024-12-16 LAB — GLUCOSE, CAPILLARY
Glucose-Capillary: 155 mg/dL — ABNORMAL HIGH (ref 70–99)
Glucose-Capillary: 145 mg/dL — ABNORMAL HIGH (ref 70–99)
Glucose-Capillary: 157 mg/dL — ABNORMAL HIGH (ref 70–99)
Glucose-Capillary: 142 mg/dL — ABNORMAL HIGH (ref 70–99)

## 2024-12-16 LAB — HIV ANTIBODY (ROUTINE TESTING W REFLEX): HIV Screen 4th Generation wRfx: NONREACTIVE

## 2024-12-16 MED ORDER — PREGABALIN 75 MG PO CAPS
75.0000 mg | ORAL_CAPSULE | Freq: Every day | ORAL | Status: DC
  Administered 2024-12-16 – 2024-12-18 (×3): 75 mg via ORAL
  Filled 2024-12-16 (×3): qty 1

## 2024-12-16 MED ORDER — TEMAZEPAM 15 MG PO CAPS
60.0000 mg | ORAL_CAPSULE | Freq: Every evening | ORAL | Status: DC | PRN
  Administered 2024-12-16 – 2024-12-17 (×2): 60 mg via ORAL
  Filled 2024-12-16: qty 8
  Filled 2024-12-16 (×2): qty 4
  Filled 2024-12-16: qty 8

## 2024-12-16 MED ORDER — HYDROXYZINE HCL 50 MG PO TABS
100.0000 mg | ORAL_TABLET | Freq: Every day | ORAL | Status: DC
  Administered 2024-12-16 – 2024-12-17 (×2): 100 mg via ORAL
  Filled 2024-12-16 (×2): qty 2

## 2024-12-16 MED ORDER — POTASSIUM CHLORIDE CRYS ER 20 MEQ PO TBCR
40.0000 meq | EXTENDED_RELEASE_TABLET | Freq: Two times a day (BID) | ORAL | Status: AC
  Administered 2024-12-16 (×2): 40 meq via ORAL
  Filled 2024-12-16 (×2): qty 2

## 2024-12-16 MED ORDER — GADOBUTROL 1 MMOL/ML IV SOLN
10.0000 mL | Freq: Once | INTRAVENOUS | Status: AC | PRN
  Administered 2024-12-16: 10 mL via INTRAVENOUS

## 2024-12-16 MED ORDER — METOPROLOL TARTRATE 50 MG PO TABS
50.0000 mg | ORAL_TABLET | Freq: Two times a day (BID) | ORAL | Status: DC
  Administered 2024-12-16 – 2024-12-18 (×5): 50 mg via ORAL
  Filled 2024-12-16 (×5): qty 1

## 2024-12-16 MED ORDER — OXYCODONE HCL 5 MG PO TABS
5.0000 mg | ORAL_TABLET | Freq: Once | ORAL | Status: AC
  Administered 2024-12-16: 5 mg via ORAL
  Filled 2024-12-16: qty 1

## 2024-12-16 MED ORDER — METHOCARBAMOL 500 MG PO TABS
500.0000 mg | ORAL_TABLET | Freq: Four times a day (QID) | ORAL | Status: DC | PRN
  Administered 2024-12-16 – 2024-12-17 (×3): 500 mg via ORAL
  Filled 2024-12-16 (×3): qty 1

## 2024-12-16 NOTE — Evaluation (Signed)
 Occupational Therapy Evaluation Patient Details Name: Emily Moon MRN: [id number] DOB: Jun 20, 1961 Today's Date: 12/16/2024   History of Present Illness   Pt admitted to [facility] on 12/15/24 for c/o stroke like symptoms including: generalized weakness, multiple recent falls. Significant PMH includes: hypotension, DM, MS, COPD, GERD, fibromyalgia, bipolar, vertigo, OA.     Clinical Impressions Pt. Presents with weakness, limited activity tolerance, aching all over, history of UE limitations, and limited functional mobility which hinders her ability to complete basic ADL and IADL functioning. Pt. Resides at home alone. Pt. Has hired caregiver assist 3 days a week  for ADLs/IADLs. Pt. Uses a walker for ambulating within the home. Pt. Endorses having a history of falls. Pt. was able to drive, however it has become more difficult to hold the wheel at the 10 and 2 position. Requires CGA with RW for walking to the bathroom toilet for safety. CGA LE dressing, and self-grooming/ hand hygiene. Pt. Will  benefit from OT services for ADL training, A/E training, UE there. Ex., Neuromuscular re-education, and Pt./caregiver education about home modification, and DME.       If plan is discharge home, recommend the following:   A little help with walking and/or transfers;A little help with bathing/dressing/bathroom;Assistance with cooking/housework     Functional Status Assessment   Patient has had a recent decline in their functional status and demonstrates the ability to make significant improvements in function in a reasonable and predictable amount of time.     Equipment Recommendations         Recommendations for Other Services         Precautions/Restrictions   Precautions Precautions: Fall Restrictions Weight Bearing Restrictions Per Provider Order: No     Mobility Bed Mobility Overal bed mobility: Independent                  Transfers                    General transfer comment: CGA walking to and from the bathroom      Balance Overall balance assessment: Needs assistance   Sitting balance-Leahy Scale: Normal       Standing balance-Leahy Scale: Fair                             ADL either performed or assessed with clinical judgement   ADL Overall ADL's : Needs assistance/impaired Eating/Feeding: Independent   Grooming: Set up;Contact guard assist               Lower Body Dressing: Contact guard assist   Toilet Transfer: Contact guard assist   Toileting- Clothing Manipulation and Hygiene: Independent               Vision Baseline Vision/History:  (No changes reported. Wears glasses.)       Perception         Praxis         Pertinent Vitals/Pain Pain Assessment Pain Score:  (Overall aching in bliateral shoulders, back LEs, and feet.) Pain Descriptors / Indicators: Aching Pain Intervention(s): Limited activity within patient's tolerance, Monitored during session, Repositioned, Patient requesting pain meds-RN notified     Extremity/Trunk Assessment Upper Extremity Assessment Upper Extremity Assessment: Generalized weakness (sensation intact)   Lower Extremity Assessment Lower Extremity Assessment: Generalized weakness (diminished sensation L foot)       Communication Communication Communication: No apparent difficulties   Cognition Arousal: Alert Behavior During Therapy: [facility]  for tasks assessed/performed                                 Following commands: Intact       Cueing  General Comments   Cueing Techniques: Verbal cues;Tactile cues;Visual cues  c/o some blurred vision, addressed with MD, likely secondary to MS. Elevated HR at rest in 110's and elevated to 120's during mobility. denies dizziness/lightheadedness.   Exercises     Shoulder Instructions      Home Living Family/patient expects to be discharged to:: Private residence Living Arrangements:  Alone Available Help at Discharge: Family;Friend(s);Available PRN/intermittently Type of Home: Apartment Home Access: Level entry     Home Layout: One level     Bathroom Shower/Tub: Producer, Television/film/video: Standard Bathroom Accessibility: Yes   Home Equipment: Shower seat;Grab bars - tub/shower;Cane - Programmer, Applications (2 wheels);Rollator (4 wheels)   Additional Comments: reports shower chair is rusted      Prior Functioning/Environment Prior Level of Function : Driving;History of Falls (last six months) (Reports having difficulty holding the steering wheel 10 and 2 position.)             Mobility Comments: mod I for household ambulation with rollator; sometimes will use walls/furniture for steadying ADLs Comments: Pt. reports having hired care aides to assist her 3 times a week. Difficulty with LE ADLs    OT Problem List: Decreased strength;Decreased range of motion;Decreased activity tolerance;Decreased coordination;Decreased knowledge of use of DME or AE;Pain   OT Treatment/Interventions:        OT Goals(Current goals can be found in the care plan section)   Acute Rehab OT Goals Patient Stated Goal: To return home OT Goal Formulation: With patient Time For Goal Achievement: 01/05/25 Potential to Achieve Goals: Good   OT Frequency:  Min 2X/week    Co-evaluation              AM-PAC OT 6 Clicks Daily Activity     Outcome Measure Help from another person eating meals?: None Help from another person taking care of personal grooming?: A Little Help from another person toileting, which includes using toliet, bedpan, or urinal?: A Little Help from another person bathing (including washing, rinsing, drying)?: A Little Help from another person to put on and taking off regular upper body clothing?: None Help from another person to put on and taking off regular lower body clothing?: A Little 6 Click Score: 20   End of Session Equipment Utilized  During Treatment: Gait belt  Activity Tolerance: Patient tolerated treatment well Patient left: in bed;with call bell/phone within reach;with bed alarm set;with nursing/sitter in room  OT Visit Diagnosis: Unsteadiness on feet (R26.81);Muscle weakness (generalized) (M62.81);History of falling (Z91.81)                Time: 8859-8785 OT Time Calculation (min): 34 min Charges:  OT General Charges $OT Visit: 1 Visit OT Evaluation $OT Eval Moderate Complexity: 1 Mod Richardson Otter, MS, OTR/L  Kenitra Leventhal 12/16/2024, 12:37 PM

## 2024-12-16 NOTE — Progress Notes (Signed)
 " PROGRESS NOTE Emily Moon    DOB: 1961/08/24, 64 y.o.  FMW:[id number]    Code Status: Full Code   DOA: 12/15/2024   LOS: 1  Brief hospital course  Emily Moon is a 64 y.o. female with a PMH significant for asthma,bipolar disorder, GERD, COPD, multiple sclerosis, HLD and fibromyalgia who presents to the hospital with complaint of weakness and slurred speech.    ED Course: BP 116/60, HR 120,  RR 18, O2 saturation 97% on RA,  and Tmax 100.4. Cbc demonstrated wbc 12.5, hb/hct 10.8/33.1, and platelet 295. Chemistry demonstrated Na 136, K 3.6, Cl 101, bicarb 25, Bun/Cr 18/1.06 and glucose 178.    CXR demonstrated linear left mid lung atelectasis or scarring. Ankle left:  no evidence of acute traumatic injury. Ct cervical spine which demonstrated no evidence of acute traumatic injury.  CT head demonstrated no acute intracranial injury. Urinalysis demonstrated many bacteria with wbc >50 and positive LE. EKG findings normal sinus rhythm with possible st depressions or t wave inversions in the inferior leads.   12/16/24 -improving  Assessment & Plan  Principal Problem:   Generalized weakness Active Problems:   HLD (hyperlipidemia)   Bipolar disorder (HCC)   Sepsis (HCC)   COPD with asthma (HCC)  Generalized weakness/ slurred speech- speech appears to be at baseline. Her outpatient SLP will be on call tomorrow and can examine to provide continuity of care.  Head CT and brain MRI negative for acute changes ruling out CVA or MS lesions. May have generalized weakness from UTI. Also a strong possibility of medication side effect as she is on several sedating medications.  - supportive care - PT/OT   Sepsis- from urine source? Still febrile today.  - continue to follow cultures.  - continue on IV Abx   Multiple sclerosis- no new lesions on brain MRI - PT/OT - continue home meds   Bipolar disorder - continue home meds   Copd with asthma- not acutely exacerbated.  - continue home  meds   Dm type 2 Patient will be started on low dose sliding scale insulin  Will add basal dose insulin  as needed  SVT- continue home metoprolol    Body mass index is 34.73 kg/m.  VTE ppx: lovenox   Diet:     Diet   Diet Carb Modified Room service appropriate? Yes   Consultants: None   Subjective 12/16/24    Pt reports feeling better. Requests her home meds to be restarted. Speech seems normal to her today. Asks for help to get to the bathroom.    Objective  Blood pressure (!) 139/96, pulse (!) 120, temperature (!) 101.3 F (38.5 C), resp. rate 20, height 5' 8 (1.727 m), weight 103.6 kg, last menstrual period 08/23/2014, SpO2 98%.  Intake/Output Summary (Last 24 hours) at 12/16/2024 0725 Last data filed at 12/16/2024 0548 Gross per 24 hour  Intake 1556.71 ml  Output --  Net 1556.71 ml   Filed Weights   12/15/24 1149  Weight: 103.6 kg    Physical Exam:  General: awake, alert, NAD HEENT: atraumatic, clear conjunctiva, anicteric sclera, MMM, hearing grossly normal Respiratory: normal respiratory effort. Cardiovascular: extremities well perfused, quick capillary refill, normal S1/S2, RRR, no JVD, murmurs Nervous: A&O x3. no gross focal neurologic deficits, normal speech Extremities: moves all equally, no edema, normal tone Skin: superficial injuries on extremities Psychiatry: normal mood, congruent affect  Labs   I have personally reviewed the following labs and imaging studies CBC    Component Value Date/Time  WBC 11.5 (H) 12/16/2024 0601   RBC 3.47 (L) 12/16/2024 0601   HGB 10.7 (L) 12/16/2024 0601   HCT 31.9 (L) 12/16/2024 0601   PLT 316 12/16/2024 0601   MCV 91.9 12/16/2024 0601   MCH 30.8 12/16/2024 0601   MCHC 33.5 12/16/2024 0601   RDW 13.7 12/16/2024 0601   LYMPHSABS 1.9 06/16/2023 1501   MONOABS 0.5 06/16/2023 1501   EOSABS 0.3 06/16/2023 1501   BASOSABS 0.1 06/16/2023 1501      Latest Ref Rng & Units 12/16/2024    6:01 AM 12/15/2024    9:24 PM  12/15/2024   11:57 AM  BMP  Glucose 70 - 99 mg/dL 856   821   BUN 8 - 23 mg/dL 10   18   Creatinine 9.55 - 1.00 mg/dL 9.24  9.10  8.93   Sodium 135 - 145 mmol/L 138   136   Potassium 3.5 - 5.1 mmol/L 3.2   3.6   Chloride 98 - 111 mmol/L 100   101   CO2 22 - 32 mmol/L 24   25   Calcium  8.9 - 10.3 mg/dL 8.3   8.4     DG Chest Portable 1 View Result Date: 12/15/2024 EXAM: 1 VIEW(S) XRAY OF THE CHEST 12/15/2024 04:32:00 PM COMPARISON: Comparison 12/24/2021. CLINICAL HISTORY: Sepsis screen. FINDINGS: LUNGS AND PLEURA: Linear atelectasis versus scarring in left mid lung. No pleural effusion. No pneumothorax. HEART AND MEDIASTINUM: Mild cardiomegaly. No acute abnormality of the mediastinal silhouette. BONES AND SOFT TISSUES: No acute osseous abnormality. IMPRESSION: 1. Linear left mid lung atelectasis or scarring. Electronically signed by: Dayne Hassell MD 12/15/2024 04:53 PM EST RP Workstation: [id number]   DG Ankle Complete Left Result Date: 12/15/2024 EXAM: 3 OR MORE VIEW(S) XRAY OF THE LEFT ANKLE 12/15/2024 12:55:00 PM CLINICAL HISTORY: Fall. COMPARISON: None available. FINDINGS: BONES AND JOINTS: No acute fracture. No malalignment. SOFT TISSUES: Unremarkable. IMPRESSION: 1. No evidence of acute traumatic injury. Electronically signed by: Donnice Mania MD 12/15/2024 01:43 PM EST RP Workstation: [id number]   CT Cervical Spine Wo Contrast Result Date: 12/15/2024 EXAM: CT CERVICAL SPINE WITHOUT CONTRAST 12/15/2024 01:19:03 PM TECHNIQUE: CT of the cervical spine was performed without the administration of intravenous contrast. Multiplanar reformatted images are provided for review. Automated exposure control, iterative reconstruction, and/or weight based adjustment of the mA/kV was utilized to reduce the radiation dose to as low as reasonably achievable. COMPARISON: MRI of the cervical spine 02/23/2020. CLINICAL HISTORY: Polytrauma, blunt. FINDINGS: BONES AND ALIGNMENT: Reversal of the normal cervical  lordosis. No evidence of traumatic malalignment. No acute fracture. DEGENERATIVE CHANGES: Disc space narrowing is most pronounced at C4-C5. There is no high grade osseous spinal canal stenosis. Facet arthrosis and no uncovertebral hypertrophy at multiple levels. Foraminal stenosis at multiple levels, most pronounced on the right at C3-C4 and bilaterally at C4-C5 and C5-C6. SOFT TISSUES: No prevertebral soft tissue swelling. IMPRESSION: 1. No evidence of acute traumatic injury. Electronically signed by: Donnice Mania MD 12/15/2024 01:35 PM EST RP Workstation: [id number]   CT Head Wo Contrast Result Date: 12/15/2024 EXAM: CT HEAD WITHOUT CONTRAST 12/15/2024 01:19:03 PM TECHNIQUE: CT of the head was performed without the administration of intravenous contrast. Automated exposure control, iterative reconstruction, and/or weight based adjustment of the mA/kV was utilized to reduce the radiation dose to as low as reasonably achievable. COMPARISON: 08/28/2024 CLINICAL HISTORY: Mental status change, unknown cause. FINDINGS: BRAIN AND VENTRICLES: No acute hemorrhage. No evidence of acute infarct. No hydrocephalus. No extra-axial collection. No mass effect  or midline shift. Hypoattenuation in the periventricular white matter suggestive of mild chronic microvascular ischemic changes. ORBITS: Bilateral lens replacement. SINUSES: No acute abnormality. SOFT TISSUES AND SKULL: No acute soft tissue abnormality. No skull fracture. IMPRESSION: 1. No acute intracranial abnormality. Electronically signed by: Donnice Mania MD 12/15/2024 01:32 PM EST RP Workstation: [id number]    Disposition Plan & Communication  Patient status: Inpatient  Admitted From: Home Planned disposition location: Home Anticipated discharge date: 2/1 pending clinical course  Family Communication: none at bedside    Author: Marien LITTIE Piety, DO [facility] 12/16/2024, 7:25 AM   Available by Epic secure chat 7AM-7PM. If 7PM-7AM, please  contact night-coverage.  [facility] contact information found on [email or web address].  "

## 2024-12-16 NOTE — Progress Notes (Signed)
" ° °  Transition of Care Plan: Pending PT/OT eval and initial consult by provider to [facility].  *RUR: 16% MOD   * Disposition- Home with HH vs. SNF  * Transportation at Discharge: Family to transport home * IM/MOONS/OBS/FOC letter: TBD * Appointment with NEURO: TBD  CM opened case for assessment D/C planning needs, CM reviewed chart. Patient from home, lives alone. EMS called for possible STOKE. she fell in her bedroom today after experiencing weakness.  she laid on the floor for 2 hours before being able to call EMS.  multiple falls recently.  notable abrasions/bruising to the L ankle. Patient is currently A&Ox4.   CM following for discharge planning needs.  Pranit Owensby CM [phone]     12/16/24 1129  TOC Brief Assessment  Insurance and Status Reviewed  Patient has primary care physician Yes  Home environment has been reviewed livesd alone  Prior level of function: IND with ADLs  Prior/Current Home Services No current home services  Social Drivers of Health Review SDOH reviewed no interventions necessary  Readmission risk has been reviewed Yes  Transition of care needs transition of care needs identified, TOC will continue to follow    "

## 2024-12-16 NOTE — Consult Note (Addendum)
" °  CLINICAL SUPPORT TEAM - WOUND OSTOMY AND CONTINENCE TEAM  CONSULTATION SERVICES   WOC Nurse-Inpatient Note  WOC Nurse Consult Note: Reason for Consult: wounds  Wound type: partial thickness x 2 L ankle r/t trauma  Pressure Injury POA: na  Measurement: Wound bed: red dry  Drainage (amount, consistency, odor) none  Periwound: intact  Dressing procedure/placement/frequency:Cover L ankle wounds with silicone foam, lift daily to assess. Change foam q3 days and prn soiling.   POC discussed with bedside nurse. Appreciate K. Joshua, Rn assistance with this consult.   Thank you,    Clemie General MSN, RN-BC, CWOCN       "

## 2024-12-16 NOTE — Evaluation (Signed)
 Physical Therapy Evaluation Patient Details Name: PAXTON KANAAN MRN: [id number] DOB: 24-May-1961 Today's Date: 12/16/2024  History of Present Illness  Pt admitted to [facility] on 12/15/24 for c/o stroke like symptoms including: generalized weakness, multiple recent falls. Significant PMH includes: hypotension, DM, MS, COPD, GERD, fibromyalgia, bipolar, vertigo, OA.  Clinical Impression  Pt received in sidelying and is agreeable for PT eval with encouragement. At baseline, pt is mod I for household ambulation with rollator vs using UE support on walls/furniture for steadying, ADL's, and driving; has assistance for household tasks.   Pt presents with generalized weakness, blurred vision, short term memory deficits, decreased activity tolerance, decreased cardiopulmonary tolerance to activity, decreased standing balance, increased pain levels, and impaired skin integrity, resulting in impaired functional mobility from baseline. Due to deficits, pt required modA- supervision for bed mobility, supervision for transfers with RW, and supervision to ambulate 45ft with RW. Episode of LOB with initiation of gait that she was able to self correct; no knee buckling noted, but presents with decreased foot clearance likely contributing to LOB.   Deficits limit the pt's ability to safely and independently perform ADL's, transfer, and ambulate. Pt will benefit from acute skilled PT services to address deficits for return to baseline function. Pt will benefit from post acute therapy services to address deficits for return to baseline function.  Encourage OOB mobility with nursing and mobility tech for meals and toileting for continued progress towards goals and maintenance of IND with functional mobility while hospitalized.          If plan is discharge home, recommend the following: A little help with bathing/dressing/bathroom;A little help with walking and/or transfers;Assistance with cooking/housework;Help with  stairs or ramp for entrance;Assist for transportation   Can travel by private vehicle    Yes    Equipment Recommendations None recommended by PT  Recommendations for Other Services       Functional Status Assessment Patient has had a recent decline in their functional status and demonstrates the ability to make significant improvements in function in a reasonable and predictable amount of time.     Precautions / Restrictions Precautions Precautions: Fall Restrictions Weight Bearing Restrictions Per Provider Order: No      Mobility  Bed Mobility               General bed mobility comments: modA for trunk facilitation to sit EOB, HOB elevated, use of BUE for support, increased time/effort. supervision for safety to lie supine, HOB semi-elevated, use of BUE for support.    Transfers                   General transfer comment: supervision for safety for STS transfers at EOB with RW, multimodal cues for safety, sequencing, and hand placement. demo's poor eccentric lowering.    Ambulation/Gait Ambulation/Gait assistance: Supervision Gait Distance (Feet): 15 Feet           General Gait Details: supervision for safety to ambulate in room with RW; demo's slowed cadence, increased UE support on RW, decreased step length/foot clearance bil. x1 LOB but able to self correct.    Balance Overall balance assessment: Needs assistance   Sitting balance-Leahy Scale: Normal     Standing balance support: Reliant on assistive device for balance, During functional activity, Bilateral upper extremity supported Standing balance-Leahy Scale: Fair Standing balance comment: episode of LOB, able to self correct; no knee buckling but decreased foot clearance  Pertinent Vitals/Pain Pain Assessment Pain Assessment: Faces Faces Pain Scale: Hurts little more Pain Location: generalized pain, mainly across bil shoulders and upper back Pain  Descriptors / Indicators: Aching Pain Intervention(s): Limited activity within patient's tolerance, Monitored during session, Repositioned, Patient requesting pain meds-RN notified    Home Living Family/patient expects to be discharged to:: Private residence Living Arrangements: Alone Available Help at Discharge: Family;Friend(s);Available PRN/intermittently (friend assists 1-2x/wk, sister can provide PRN care (unknown level of physical assist)) Type of Home: Apartment Home Access: Level entry       Home Layout: One level Home Equipment: Shower seat;Grab bars - tub/shower;Cane - [employer] (2 wheels);Rollator (4 wheels) Additional Comments: reports shower chair is rusted    Prior Function Prior Level of Function : Driving;History of Falls (last six months)             Mobility Comments: mod I for household ambulation with rollator; sometimes will use walls/furniture for steadying ADLs Comments: mod I with ADL's (increased difficulty with LB ADL's due to MS symptoms); assist with IADL's from friends/family     Extremity/Trunk Assessment   Upper Extremity Assessment Upper Extremity Assessment: Generalized weakness (sensation intact)    Lower Extremity Assessment Lower Extremity Assessment: Generalized weakness (diminished sensation L foot)       Communication   Communication Communication: No apparent difficulties    Cognition Arousal: Alert Behavior During Therapy: WFL for tasks assessed/performed   PT - Cognitive impairments: Safety/Judgement                       PT - Cognition Comments: short term memory deficits Following commands: Intact       Cueing Cueing Techniques: Verbal cues, Tactile cues, Visual cues     General Comments General comments (skin integrity, edema, etc.): c/o some blurred vision, addressed with MD, likely secondary to MS. Elevated HR at rest in 110's and elevated to 120's during mobility. denies  dizziness/lightheadedness.    Exercises Other Exercises Other Exercises: Pt edu re: PT role/POC, DC recs, safety with functional mobility, current level of assist required, fall risk, benefits of consistency with PT/OT for management of MS.   Assessment/Plan    PT Assessment Patient needs continued PT services  PT Problem List Decreased strength;Decreased activity tolerance;Decreased balance;Decreased mobility;Decreased cognition;Decreased safety awareness;Cardiopulmonary status limiting activity;Impaired sensation;Pain       PT Treatment Interventions DME instruction;Gait training;Stair training;Functional mobility training;Therapeutic activities;Therapeutic exercise;Balance training;Neuromuscular re-education;Patient/family education    PT Goals (Current goals can be found in the Care Plan section)  Acute Rehab PT Goals Patient Stated Goal: go home PT Goal Formulation: With patient Time For Goal Achievement: 12/30/24 Potential to Achieve Goals: Good    Frequency Min 2X/week        AM-PAC PT 6 Clicks Mobility  Outcome Measure Help needed turning from your back to your side while in a flat bed without using bedrails?: A Little Help needed moving from lying on your back to sitting on the side of a flat bed without using bedrails?: A Lot Help needed moving to and from a bed to a chair (including a wheelchair)?: A Little Help needed standing up from a chair using your arms (e.g., wheelchair or bedside chair)?: A Little Help needed to walk in hospital room?: A Little Help needed climbing 3-5 steps with a railing? : A Little 6 Click Score: 17    End of Session Equipment Utilized During Treatment: Gait belt Activity Tolerance: Patient tolerated treatment well Patient left:  in bed;with call bell/phone within reach;with bed alarm set Nurse Communication: Mobility status PT Visit Diagnosis: Unsteadiness on feet (R26.81);Muscle weakness (generalized) (M62.81);History of falling  (Z91.81);Difficulty in walking, not elsewhere classified (R26.2)    Time: 9084-9063 PT Time Calculation (min) (ACUTE ONLY): 21 min   Charges:   PT Evaluation $PT Eval Moderate Complexity: 1 Mod   PT General Charges $$ ACUTE PT VISIT: 1 Visit        Camie CHARLENA Kluver, PT, DPT 10:22 AM,12/16/24 Physical Therapist - [facility]

## 2024-12-17 LAB — HEPATIC FUNCTION PANEL
ALT: 18 U/L (ref 0–44)
AST: 19 U/L (ref 15–41)
Albumin: 3.5 g/dL (ref 3.5–5.0)
Alkaline Phosphatase: 48 U/L (ref 38–126)
Bilirubin, Direct: 0.3 mg/dL — ABNORMAL HIGH (ref 0.0–0.2)
Indirect Bilirubin: 0.1 mg/dL — ABNORMAL LOW (ref 0.3–0.9)
Total Bilirubin: 0.4 mg/dL (ref 0.0–1.2)
Total Protein: 6.1 g/dL — ABNORMAL LOW (ref 6.5–8.1)

## 2024-12-17 LAB — CBC
HCT: 31.1 % — ABNORMAL LOW (ref 36.0–46.0)
Hemoglobin: 10.5 g/dL — ABNORMAL LOW (ref 12.0–15.0)
MCH: 30.3 pg (ref 26.0–34.0)
MCHC: 33.8 g/dL (ref 30.0–36.0)
MCV: 89.6 fL (ref 80.0–100.0)
Platelets: 360 10*3/uL (ref 150–400)
RBC: 3.47 MIL/uL — ABNORMAL LOW (ref 3.87–5.11)
RDW: 13.7 % (ref 11.5–15.5)
WBC: 9.3 10*3/uL (ref 4.0–10.5)
nRBC: 0 % (ref 0.0–0.2)

## 2024-12-17 LAB — BASIC METABOLIC PANEL WITH GFR
Anion gap: 11 (ref 5–15)
BUN: 11 mg/dL (ref 8–23)
CO2: 26 mmol/L (ref 22–32)
Calcium: 8.5 mg/dL — ABNORMAL LOW (ref 8.9–10.3)
Chloride: 101 mmol/L (ref 98–111)
Creatinine, Ser: 0.66 mg/dL (ref 0.44–1.00)
GFR, Estimated: >60 mL/min
Glucose, Bld: 161 mg/dL — ABNORMAL HIGH (ref 70–99)
Potassium: 3.3 mmol/L — ABNORMAL LOW (ref 3.5–5.1)
Sodium: 138 mmol/L (ref 135–145)

## 2024-12-17 LAB — MAGNESIUM: Magnesium: 1.6 mg/dL — ABNORMAL LOW (ref 1.7–2.4)

## 2024-12-17 LAB — GLUCOSE, CAPILLARY
Glucose-Capillary: 111 mg/dL — ABNORMAL HIGH (ref 70–99)
Glucose-Capillary: 149 mg/dL — ABNORMAL HIGH (ref 70–99)
Glucose-Capillary: 129 mg/dL — ABNORMAL HIGH (ref 70–99)
Glucose-Capillary: 123 mg/dL — ABNORMAL HIGH (ref 70–99)

## 2024-12-17 MED ORDER — IBUPROFEN 400 MG PO TABS
600.0000 mg | ORAL_TABLET | Freq: Four times a day (QID) | ORAL | Status: DC | PRN
  Administered 2024-12-17 (×2): 600 mg via ORAL
  Filled 2024-12-17 (×2): qty 2

## 2024-12-17 MED ORDER — POTASSIUM CHLORIDE CRYS ER 20 MEQ PO TBCR
40.0000 meq | EXTENDED_RELEASE_TABLET | Freq: Once | ORAL | Status: AC
  Administered 2024-12-17: 40 meq via ORAL
  Filled 2024-12-17: qty 2

## 2024-12-17 NOTE — Progress Notes (Signed)
 " PROGRESS NOTE Emily Moon    DOB: [birth year], 64 y.o.  FMW:[id number]    Code Status: Full Code   DOA: 12/15/2024   LOS: 2  Brief hospital course  Emily Moon is a 64 y.o. female with a PMH significant for asthma,bipolar disorder, GERD, COPD, multiple sclerosis, HLD and fibromyalgia who presents to the hospital with complaint of weakness and slurred speech.    ED Course: BP 116/60, HR 120,  RR 18, O2 saturation 97% on RA,  and Tmax 100.4. Cbc demonstrated wbc 12.5, hb/hct 10.8/33.1, and platelet 295. Chemistry demonstrated Na 136, K 3.6, Cl 101, bicarb 25, Bun/Cr 18/1.06 and glucose 178.    CXR demonstrated linear left mid lung atelectasis or scarring. Ankle left:  no evidence of acute traumatic injury. Ct cervical spine which demonstrated no evidence of acute traumatic injury.  CT head demonstrated no acute intracranial injury. Urinalysis demonstrated many bacteria with wbc >50 and positive LE. EKG findings normal sinus rhythm with possible st depressions or t wave inversions in the inferior leads.   12/17/24 -patient again febrile at 101.1 with generalized aches and pains.  She thinks that she is developing jaundice so hepatic functions were obtained and they were normal. Urine cultures growing E. coli-pending susceptibility  Assessment & Plan  Principal Problem:   Generalized weakness Active Problems:   HLD (hyperlipidemia)   Bipolar disorder (HCC)   Sepsis (HCC)   COPD with asthma (HCC)  Generalized weakness/ slurred speech- speech appears to be at baseline. Her outpatient SLP will be on call tomorrow and can examine to provide continuity of care.  Head CT and brain MRI negative for acute changes ruling out CVA or MS lesions. May have generalized weakness from UTI. Also a strong possibility of medication side effect as she is on several sedating medications.  - supportive care - PT/OT-recommending home health-ordered   Sepsis- from urine source? Still febrile today.  Urine  cultures growing E. coli-pending susceptibility - continue to follow cultures.  - continue on IV Abx   Multiple sclerosis- no new lesions on brain MRI - PT/OT - continue home meds   Bipolar disorder - continue home meds   Copd with asthma- not acutely exacerbated.  - continue home meds   Dm type 2 Patient will be started on low dose sliding scale insulin  Will add basal dose insulin  as needed  SVT- continue home metoprolol    Body mass index is 34.73 kg/m.  VTE ppx: lovenox   Diet:     Diet   Diet Carb Modified Room service appropriate? Yes   Consultants: None   Subjective 12/17/24    Patient was feeling heart with generalized aches and pain when seen today.  She thinks that she is becoming jaundiced despite reassurance.   Objective  Blood pressure (!) 139/96, pulse (!) 120, temperature (!) 101.3 F (38.5 C), resp. rate 20, height 5' 8 (1.727 m), weight 103.6 kg, last menstrual period 08/23/2014, SpO2 98%.  Intake/Output Summary (Last 24 hours) at 12/17/2024 1447 Last data filed at 12/17/2024 0525 Gross per 24 hour  Intake 301.09 ml  Output --  Net 301.09 ml   Filed Weights   12/15/24 1149  Weight: 103.6 kg    Physical Exam:  General.  Obese lady, in no acute distress. Pulmonary.  Lungs clear bilaterally, normal respiratory effort. CV.  Regular rate and rhythm, no JVD, rub or murmur. Abdomen.  Soft, nontender, nondistended, BS positive. CNS.  Alert and oriented .  No focal neurologic  deficit. Extremities.  No edema,  pulses intact and symmetrical. Psychiatry.  Judgment and insight appears normal.   Labs   I have personally reviewed the following labs and imaging studies CBC    Component Value Date/Time   WBC 9.3 12/17/2024 0641   RBC 3.47 (L) 12/17/2024 0641   HGB 10.5 (L) 12/17/2024 0641   HCT 31.1 (L) 12/17/2024 0641   PLT 360 12/17/2024 0641   MCV 89.6 12/17/2024 0641   MCH 30.3 12/17/2024 0641   MCHC 33.8 12/17/2024 0641   RDW 13.7 12/17/2024  0641   LYMPHSABS 1.9 06/16/2023 1501   MONOABS 0.5 06/16/2023 1501   EOSABS 0.3 06/16/2023 1501   BASOSABS 0.1 06/16/2023 1501      Latest Ref Rng & Units 12/17/2024    6:41 AM 12/16/2024    6:01 AM 12/15/2024    9:24 PM  BMP  Glucose 70 - 99 mg/dL 838  856    BUN 8 - 23 mg/dL 11  10    Creatinine 9.55 - 1.00 mg/dL 9.33  9.24  9.10   Sodium 135 - 145 mmol/L 138  138    Potassium 3.5 - 5.1 mmol/L 3.3  3.2    Chloride 98 - 111 mmol/L 101  100    CO2 22 - 32 mmol/L 26  24    Calcium  8.9 - 10.3 mg/dL 8.5  8.3      DG Abd 1 View Result Date: 12/16/2024 EXAM: 1 VIEW XRAY OF THE ABDOMEN 12/16/2024 01:59:39 AM COMPARISON: 05/14/2022 CLINICAL HISTORY: Abdominal pain. FINDINGS: BOWEL: Nonobstructive bowel gas pattern. SOFT TISSUES: No abnormal calcifications. BONES: Bilateral hip arthroplasty noted. Degenerative changes of the spine. No acute fracture. IMPRESSION: 1. No acute findings. Electronically signed by: Golding Calk MD 12/16/2024 01:09 PM EST RP Workstation: [id number]   MR Brain W and Wo Contrast Result Date: 12/16/2024 EXAM: MRI BRAIN WITH AND WITHOUT CONTRAST 12/16/2024 11:08:49 AM TECHNIQUE: Multiplanar multisequence MRI of the head/brain was performed with and without the administration of intravenous contrast. COMPARISON: CT head without contrast 12/16/2023 and MR head without contrast 01/08/2022. CLINICAL HISTORY: eval MS flair, cva , increased weakness Evaluate multiple sclerosis flair, cerebrovascular accident, increased weakness. FINDINGS: BRAIN AND VENTRICLES: No acute infarct. No acute intracranial hemorrhage. No mass effect or midline shift. No hydrocephalus. The sella is unremarkable. Normal flow voids. Periventricular T2 hyperintensities involving the callosal septal margin are stable. Subcortical T2 hyperintensities bilaterally are stable as well. No restricted diffusion or enhancement is present to suggest acute demyelination. ORBITS: Bilateral lens replacements are noted.  SINUSES: No significant abnormality. BONES AND SOFT TISSUES: Normal bone marrow signal. No soft tissue abnormality. IMPRESSION: 1. No evidence of acute demyelination. 2. Stable chronic demyelinating disease with periventricular and subcortical T2 hyperintensities consistent with the given diagnosis of multiple sclerosis. . Electronically signed by: Lonni Necessary MD 12/16/2024 12:06 PM EST RP Workstation: [id number]   DG Chest Portable 1 View Result Date: 12/15/2024 EXAM: 1 VIEW(S) XRAY OF THE CHEST 12/15/2024 04:32:00 PM COMPARISON: Comparison 12/24/2021. CLINICAL HISTORY: Sepsis screen. FINDINGS: LUNGS AND PLEURA: Linear atelectasis versus scarring in left mid lung. No pleural effusion. No pneumothorax. HEART AND MEDIASTINUM: Mild cardiomegaly. No acute abnormality of the mediastinal silhouette. BONES AND SOFT TISSUES: No acute osseous abnormality. IMPRESSION: 1. Linear left mid lung atelectasis or scarring. Electronically signed by: Dayne Hassell MD 12/15/2024 04:53 PM EST RP Workstation: [id number]    Disposition Plan & Communication  Patient status: Inpatient  Admitted From: Home Planned disposition location: Home Anticipated discharge  date: Likely in 1 to 2 days, once fever improved  Family Communication: Discussed with patient   Author: Amaryllis Dare, MD [facility] 12/17/2024, 2:47 PM   Available by Epic secure chat 7AM-7PM. If 7PM-7AM, please contact night-coverage.  [facility] contact information found on [email or web address].  "

## 2024-12-17 NOTE — Plan of Care (Signed)
  Problem: Clinical Measurements: Goal: Ability to maintain clinical measurements within normal limits will improve Outcome: Progressing   Problem: Pain Managment: Goal: General experience of comfort will improve and/or be controlled Outcome: Progressing   Problem: Safety: Goal: Ability to remain free from injury will improve Outcome: Progressing

## 2024-12-17 NOTE — Progress Notes (Signed)
 SLP Cancellation Note  Patient Details Name: Emily Moon MRN: [id number] DOB: 1960-12-22   Cancelled treatment:       Reason Eval/Treat Not Completed: SLP screened, no needs identified, will sign off   Lakeitha Basques 12/17/2024, 2:18 PM

## 2024-12-18 ENCOUNTER — Encounter: Payer: Self-pay | Admitting: Oncology

## 2024-12-18 ENCOUNTER — Other Ambulatory Visit: Payer: Self-pay

## 2024-12-18 LAB — URINE CULTURE: Culture: >=100000 — AB

## 2024-12-18 LAB — GLUCOSE, CAPILLARY
Glucose-Capillary: 102 mg/dL — ABNORMAL HIGH (ref 70–99)
Glucose-Capillary: 116 mg/dL — ABNORMAL HIGH (ref 70–99)

## 2024-12-18 MED ORDER — IPRATROPIUM-ALBUTEROL 0.5-2.5 (3) MG/3ML IN SOLN
3.0000 mL | Freq: Four times a day (QID) | RESPIRATORY_TRACT | 0 refills | Status: AC | PRN
  Filled 2024-12-18: qty 360, 30d supply, fill #0

## 2024-12-18 MED ORDER — IPRATROPIUM-ALBUTEROL 0.5-2.5 (3) MG/3ML IN SOLN: 3.0000 mL | Freq: Four times a day (QID) | RESPIRATORY_TRACT | Status: DC | PRN

## 2024-12-18 MED ORDER — HYDROCOD POLI-CHLORPHE POLI ER 10-8 MG/5ML PO SUER
5.0000 mL | Freq: Every evening | ORAL | 0 refills | Status: AC | PRN
  Filled 2024-12-18: qty 25, 5d supply, fill #0

## 2024-12-18 MED ORDER — HYDROCOD POLI-CHLORPHE POLI ER 10-8 MG/5ML PO SUER: 5.0000 mL | Freq: Every evening | ORAL | Status: DC | PRN

## 2024-12-18 MED ORDER — GUAIFENESIN 100 MG/5ML PO LIQD: 5.0000 mL | Freq: Four times a day (QID) | ORAL | Status: DC | PRN

## 2024-12-18 MED ORDER — BENZONATATE 100 MG PO CAPS
100.0000 mg | ORAL_CAPSULE | Freq: Three times a day (TID) | ORAL | 0 refills | Status: AC | PRN
  Filled 2024-12-18: qty 27, 9d supply, fill #0

## 2024-12-18 MED ORDER — GUAIFENESIN 100 MG/5ML PO LIQD
5.0000 mL | Freq: Four times a day (QID) | ORAL | 0 refills | Status: AC | PRN
  Filled 2024-12-18: qty 118, 6d supply, fill #0

## 2024-12-18 MED ORDER — CEPHALEXIN 500 MG PO CAPS
500.0000 mg | ORAL_CAPSULE | Freq: Two times a day (BID) | ORAL | 0 refills | Status: AC
  Filled 2024-12-18: qty 6, 3d supply, fill #0

## 2024-12-18 MED ORDER — BENZONATATE 100 MG PO CAPS
100.0000 mg | ORAL_CAPSULE | Freq: Three times a day (TID) | ORAL | Status: DC | PRN
  Administered 2024-12-18: 100 mg via ORAL
  Filled 2024-12-18: qty 1

## 2024-12-18 MED ORDER — MAGNESIUM SULFATE IN D5W 1-5 GM/100ML-% IV SOLN
1.0000 g | Freq: Once | INTRAVENOUS | Status: AC
  Administered 2024-12-18: 1 g via INTRAVENOUS
  Filled 2024-12-18: qty 100

## 2024-12-18 NOTE — Assessment & Plan Note (Addendum)
 Home duloxetine  40 mg daily, trazodone  100 mg nightly, Lamictal  200 mg nightly

## 2024-12-18 NOTE — Assessment & Plan Note (Signed)
 Fenofibrate  160 mg daily resumed

## 2024-12-18 NOTE — Assessment & Plan Note (Addendum)
 Fall precaution , home health PT

## 2024-12-18 NOTE — Assessment & Plan Note (Signed)
 Blood cultures x 2 collected on 12/15/2024 reviewed today, no growth to date x 3 days Sepsis ruled out

## 2024-12-18 NOTE — Hospital Course (Addendum)
 Ms. Emily Moon is a 64 year old female with history of asthma, bipolar disorder, GERD, COPD, multiple sclerosis, hyperlipidemia, fibromyalgia.  12/15/2024: Presented to the ED for chief concerns of weakness and slurred speech.  Patient admitted to hospital service for stroke workup.  1/31: MRI of the brain with and without contrast: Was read as no evidence of acute demyelination.  Chronic and stable demyelinating disease with periventricular and subcortical T2 hyperdensities.  2/1: Magnesium  level was noted to be 1.6. Home health PT, OT, Home health aide, SLP have been placed.  2/2: I assumed care of the patient.  Magnesium  1 g IV PB ordered today.  DME nebulizer machine ordered. Patient will be discharged with keflex  500 mg PO BID to complete 5 day course. Duoneb solution, tessalon  perls, tussinex at bedtime prn for cough have been prescribed.

## 2024-12-19 ENCOUNTER — Ambulatory Visit: Admit: 2024-12-19 | Admitting: Internal Medicine

## 2024-12-19 SURGERY — COLONOSCOPY
Anesthesia: General

## 2024-12-20 ENCOUNTER — Telehealth: Payer: Self-pay | Admitting: *Deleted

## 2024-12-20 LAB — CULTURE, BLOOD (ROUTINE X 2)
Culture: NO GROWTH
Culture: NO GROWTH

## 2024-12-20 NOTE — Transitions of Care (Post Inpatient/ED Visit) (Signed)
" ° °  12/20/2024  Name: Emily Moon MRN: [id number] DOB: [birth year]  Today's TOC FU Call Status: Today's TOC FU Call Status:: Unsuccessful Call (1st Attempt) Unsuccessful Call (1st Attempt) Date: 12/20/24  Attempted to reach the patient regarding the most recent Inpatient/ED visit.  Follow Up Plan: Additional outreach attempts will be made to reach the patient to complete the Transitions of Care (Post Inpatient/ED visit) call.    Olam Ku, RN, BSN, MSN [facility], [facility] RN Care Manager Direct Dial: [phone]  Fax: [phone]   "

## 2024-12-21 ENCOUNTER — Ambulatory Visit
Admission: EM | Admit: 2024-12-21 | Discharge: 2024-12-21 | Disposition: A | Discharge status: Home / Self Care | Source: Home / Self Care

## 2024-12-21 ENCOUNTER — Ambulatory Visit: Payer: Self-pay | Admitting: *Deleted

## 2024-12-21 ENCOUNTER — Ambulatory Visit (INDEPENDENT_AMBULATORY_CARE_PROVIDER_SITE_OTHER)

## 2024-12-21 ENCOUNTER — Telehealth: Admitting: Physician Assistant

## 2024-12-21 ENCOUNTER — Telehealth: Payer: Self-pay | Admitting: Emergency Medicine

## 2024-12-21 DIAGNOSIS — R0602 Shortness of breath: Secondary | ICD-10-CM | POA: Diagnosis not present

## 2024-12-21 DIAGNOSIS — J069 Acute upper respiratory infection, unspecified: Secondary | ICD-10-CM | POA: Diagnosis not present

## 2024-12-21 DIAGNOSIS — J101 Influenza due to other identified influenza virus with other respiratory manifestations: Secondary | ICD-10-CM | POA: Diagnosis not present

## 2024-12-21 DIAGNOSIS — J441 Chronic obstructive pulmonary disease with (acute) exacerbation: Secondary | ICD-10-CM

## 2024-12-21 LAB — POC COVID19/FLU A&B COMBO
Covid Antigen, POC: NEGATIVE
Influenza A Antigen, POC: POSITIVE — AB
Influenza B Antigen, POC: NEGATIVE

## 2024-12-21 MED ORDER — METHYLPREDNISOLONE SODIUM SUCC 125 MG IJ SOLR
60.0000 mg | Freq: Once | INTRAMUSCULAR | Status: AC
  Administered 2024-12-21: 60 mg via INTRAMUSCULAR

## 2024-12-21 MED ORDER — AZITHROMYCIN 250 MG PO TABS: 250.0000 mg | ORAL_TABLET | Freq: Every day | ORAL | 0 refills | Status: AC

## 2024-12-21 NOTE — Telephone Encounter (Signed)
 Agree that in-person evaluation is appropriate.

## 2024-12-21 NOTE — ED Notes (Signed)
 Patient triage by provider Teresa Shelba SAUNDERS, NP

## 2024-12-21 NOTE — Discharge Instructions (Addendum)
 Today you tested positive for influenza A which is a virus, most viruses take about 7 to 10 days to fully resolve, generally most likely flaring your asthma/COPD  Unfortunately as you have been sick for 5 days we are unable to use Tamiflu however despite use of this medicine you should still gradually improve  Chest x-ray is pending and you will be notified of results by telephone  Begin use of azithromycin  to provide coverage for bacteria contributing to symptoms  You have been given an injection of steroids to help open and relax the airway to help manage your shortness of breath, ideally will see if symptoms improve within the next 30 minutes to an hour  You have chosen to hold off on oral steroids as they increase your blood sugar however if you continue to have shortness of breath you will need to be reevaluated for use of this medication  Continue use of inhaler and nebulizer treatments as needed for shortness of breath  You may continue use of Tessalon , Tussionex and Mucinex  to further help with your congestion and coughing  At any point if your breathing were to worsen please go to the nearest emergency department for immediate evaluation

## 2024-12-21 NOTE — Patient Instructions (Signed)
 " Emily Moon, thank you for joining Emily Velma Lunger, PA-C for today's virtual visit.  While this provider is not your primary care provider (PCP), if your PCP is located in our provider database this encounter information will be shared with them immediately following your visit.   A [place] MyChart account gives you access to today's visit and all your visits, tests, and labs performed at [facility]  click here if you don't have a [facility] MyChart account or go to [email or web address]  Consent: (Patient) Emily Moon provided verbal consent for this virtual visit at the beginning of the encounter.  Current Medications:  Current Outpatient Medications:    aspirin  EC 81 MG tablet, Take 81 mg by mouth daily. Swallow whole., Disp: , Rfl:    Azelastine  HCl 137 MCG/SPRAY SOLN, PLACE 2 SPRAYS INTO BOTH NOSTRILS 2 (TWO) TIMES DAILY (Patient not taking: Reported on 12/16/2024), Disp: 30 mL, Rfl: 11   B Complex-Biotin -FA (BIG 100, BIOTIN , PO), Take 300 mg by mouth at bedtime. (Patient not taking: Reported on 12/16/2024), Disp: , Rfl:    BD INTEGRA SYRINGE 25G X 1 3 ML MISC, SMARTSIG:1 Syringe(s) Once a Month, Disp: , Rfl:    benzonatate  (TESSALON ) 100 MG capsule, Take 1 capsule (100 mg total) by mouth 3 (three) times daily as needed for cough., Disp: 27 capsule, Rfl: 0   Biotin  5 MG TBDP, Take 200 mg by mouth., Disp: , Rfl:    bisacodyl  (DULCOLAX) 5 MG EC tablet, Take 5 mg by mouth daily as needed for moderate constipation., Disp: , Rfl:    cephALEXin  (KEFLEX ) 500 MG capsule, Take 1 capsule (500 mg total) by mouth 2 (two) times daily for 3 days., Disp: 6 capsule, Rfl: 0   cetirizine (ZYRTEC) 10 MG tablet, Take 10 mg by mouth daily., Disp: , Rfl:    chlorpheniramine-HYDROcodone  (TUSSIONEX) 10-8 MG/5ML, Take 5 mLs by mouth at bedtime as needed for up to 5 days (cough at night)., Disp: 25 mL, Rfl: 0   Cholecalciferol  (VITAMIN D3) 250 MCG (10000 UT) capsule, Take 6  capsules by mouth at bedtime., Disp: , Rfl:    clindamycin  (CLEOCIN  T) 1 % external solution, Apply to scalp once or twice a day prn bumps (Patient not taking: Reported on 12/16/2024), Disp: 30 mL, Rfl: 2   clobetasol  cream (TEMOVATE ) 0.05 %, Apply 1 Application topically 2 (two) times daily. Apply to aa QD-BID PRN up to two weeks. Then up to 5 days per week as needed. Avoid applying to face, groin, and axilla. Use as directed. Long-term use can cause thinning of the skin., Disp: 15 g, Rfl: 1   Continuous Glucose Receiver (FREESTYLE LIBRE 3 READER) DEVI, USE AS DIRECTED, Disp: 1 each, Rfl: 0   Continuous Glucose Receiver (FREESTYLE LIBRE 3 READER) DEVI, Use to check blood sugar continuous, Disp: 1 each, Rfl: 0   Continuous Glucose Sensor (FREESTYLE LIBRE 3 SENSOR) MISC, PLACE 1 SENSOR ON THE SKIN EVERY 14 DAYS. USE TO CHECK GLUCOSE CONTINUOUSLY, Disp: 6 each, Rfl: 3   desipramine  (NORPRAMIN ) 25 MG tablet, 2  qam, Disp: 180 tablet, Rfl: 2   diphenhydrAMINE  (BENADRYL ) 2 % cream, Apply topically 3 (three) times daily as needed for itching., Disp: , Rfl:    Dulaglutide  (TRULICITY ) 3 MG/0.5ML SOAJ, Inject 3 mg into the skin once a week., Disp: 6 mL, Rfl: 3   DULoxetine  (CYMBALTA ) 20 MG capsule, Take 2 capsules (40 mg total) by mouth daily., Disp: 60 capsule, Rfl: 5  Evolocumab  (REPATHA  SURECLICK) 140 MG/ML SOAJ, INJECT 140 MG INTO THE SKIN EVERY 14 (FOURTEEN) DAYS. CALL TO SCHEDULE APPT PRIOR TO NEXT REFILL, Disp: 6 mL, Rfl: 3   fenofibrate  (TRICOR ) 145 MG tablet, TAKE 1 TABLET BY MOUTH DAILY FOR CHOLESTEROL *NEED APPOINTMENT, Disp: 90 tablet, Rfl: 3   Fluticasone -Umeclidin-Vilant (TRELEGY ELLIPTA ) 200-62.5-25 MCG/ACT AEPB, Inhale 1 puff into the lungs daily., Disp: , Rfl:    guaiFENesin  (ROBITUSSIN) 100 MG/5ML liquid, Take 5 mLs by mouth every 6 (six) hours as needed for to loosen phlegm (and/or cough during the day not relieved with tessalon  perls)., Disp: 118 mL, Rfl: 0   hydrOXYzine  (VISTARIL ) 50 MG  capsule, Take 2 capsules (100 mg total) by mouth at bedtime. TAKE 1 CAPSLE BY MOUTH AT NOON, 1 AT 6 PM, AND 1 AS NEEDED, Disp: 180 capsule, Rfl: 2   ibuprofen  (ADVIL ) 200 MG tablet, Take 800 mg by mouth 2 (two) times daily., Disp: , Rfl:    ipratropium-albuterol  (DUONEB) 0.5-2.5 (3) MG/3ML SOLN, Take 3 mLs by nebulization every 6 (six) hours as needed., Disp: 360 mL, Rfl: 0   ipratropium-albuterol  (DUONEB) 0.5-2.5 (3) MG/3ML SOLN, Take 3 mLs by nebulization every 6 (six) hours as needed (wheezing, shortness of breath)., Disp: 360 mL, Rfl: 0   ketoconazole  (NIZORAL ) 2 % shampoo, Apply 1 Application topically as directed. Wash from waist up 3 times a week for 2 months, then decrease to 1 time a month, let sit 5 minutes and rinse off (Patient not taking: Reported on 12/16/2024), Disp: 120 mL, Rfl: 6   L-Lysine  HCl 500 MG TABS, Take 1,000 mg by mouth., Disp: , Rfl:    lamoTRIgine  (LAMICTAL ) 150 MG tablet, Take 2 tablets (300 mg total) by mouth daily at bedtime., Disp: 180 tablet, Rfl: 5   levalbuterol  (XOPENEX  HFA) 45 MCG/ACT inhaler, Inhale 2 puffs into the lungs every 4 (four) hours as needed for wheezing or shortness of breath (or cough)., Disp: 15 g, Rfl: 12   lidocaine  (LMX) 4 % cream, Apply 1 Application topically daily as needed., Disp: , Rfl:    metFORMIN  (GLUCOPHAGE ) 500 MG tablet, TAKE 2 TABS BY MOUTH EVERY MORNING AND 1 TAB IN THE EVENING, Disp: 270 tablet, Rfl: 1   methocarbamol  (ROBAXIN ) 500 MG tablet, Take 1,500 mg by mouth at bedtime., Disp: , Rfl:    metoprolol  tartrate (LOPRESSOR ) 50 MG tablet, TAKE 1 TABLET BY MOUTH TWICE A DAY, Disp: 180 tablet, Rfl: 3   montelukast  (SINGULAIR ) 10 MG tablet, TAKE 1 TABLET BY MOUTH EVERYDAY AT BEDTIME, Disp: 90 tablet, Rfl: 0   mupirocin  ointment (BACTROBAN ) 2 %, Apply 1 Application topically daily. Qd to wound on finger (Patient not taking: Reported on 12/16/2024), Disp: 22 g, Rfl: 1   neomycin -bacitracin-polymyxin (NEOSPORIN) [id number] ointment, Apply 1  application topically 4 (four) times daily as needed (for cut/scrapes.)., Disp: , Rfl:    NEOMYCIN -POLYMYXIN-HYDROCORTISONE (CORTISPORIN) 1 % SOLN OTIC solution, Apply 1-2 drops to toe BID after soaking (Patient not taking: Reported on 12/16/2024), Disp: 10 mL, Rfl: 1   pantoprazole  (PROTONIX ) 40 MG tablet, Take 1 tablet (40 mg total) by mouth 2 (two) times daily before a meal. For heartburn, Disp: 180 tablet, Rfl: 3   polyvinyl alcohol  (LIQUIFILM TEARS) 1.4 % ophthalmic solution, Place 2 drops into both eyes at bedtime., Disp: , Rfl:    pregabalin  (LYRICA ) 75 MG capsule, Take 1 capsule (75 mg total) by mouth 2 (two) times daily. (Patient taking differently: Take 75 mg by mouth daily.), Disp: 60  capsule, Rfl: 3   Roflumilast  (ZORYVE ) 0.3 % CREA, Apply 1 application  topically daily. qd to aa psoriasis on legs, arms, Disp: 60 g, Rfl: 4   Ruxolitinib Phosphate  (OPZELURA ) 1.5 % CREA, Apply to aa's rash QD PRN. (Patient not taking: Reported on 12/16/2024), Disp: 60 g, Rfl: 3   STIOLTO RESPIMAT  2.5-2.5 MCG/ACT AERS, Inhale 2 puffs into the lungs at bedtime., Disp: , Rfl:    temazepam  (RESTORIL ) 30 MG capsule, Take 2 capsules (60 mg total) by mouth at bedtime as needed for sleep., Disp: 60 capsule, Rfl: 0   traZODone  (DESYREL ) 100 MG tablet, 3 qhs, Disp: 90 tablet, Rfl: 4   valACYclovir  (VALTREX ) 1000 MG tablet, Take 2 tablets by mouth twice daily for 1 day as needed for herpes outbreak., Disp: 12 tablet, Rfl: 0   Medications ordered in this encounter:  No orders of the defined types were placed in this encounter.    *If you need refills on other medications prior to your next appointment, please contact your pharmacy*  Follow-Up: Call back or seek an in-person evaluation if the symptoms worsen or if the condition fails to improve as anticipated.     Other Instructions IF your primary care office is unable to see you, please use the link below for in-person assessment today. If anything acutely  worsens, please be evaluated at nearest emergency department. PLEASE DO NOT DELAY CARE.   If you have been instructed to have an in-person evaluation today at a local Urgent Care facility, please use the link below. It will take you to a list of all of our available [facility], including address, phone number and hours of operation. Please do not delay care.  [facility]  If you or a family member do not have a primary care provider, use the link below to schedule a visit and establish care. When you choose a [facility] primary care physician or advanced practice provider, you gain a long-term partner in health. Find a Primary Care Provider  Learn more about [facility]'s in-office and virtual care options:  - Get Care Now  "

## 2024-12-21 NOTE — Telephone Encounter (Signed)
 Attempted to call patient x 1 regarding x-ray results, went straight to voicemail, left voicemail to return call to

## 2024-12-21 NOTE — Progress Notes (Signed)
 " Virtual Visit Consent   Emily Moon, you are scheduled for a virtual visit with a [facility] provider today. Just as with appointments in the office, your consent must be obtained to participate. Your consent will be active for this visit and any virtual visit you may have with one of our providers in the next 365 days. If you have a MyChart account, a copy of this consent can be sent to you electronically.  As this is a virtual visit, video technology does not allow for your provider to perform a traditional examination. This may limit your provider's ability to fully assess your condition. If your provider identifies any concerns that need to be evaluated in person or the need to arrange testing (such as labs, EKG, etc.), we will make arrangements to do so. Although advances in technology are sophisticated, we cannot ensure that it will always work on either your end or our end. If the connection with a video visit is poor, the visit may have to be switched to a telephone visit. With either a video or telephone visit, we are not always able to ensure that we have a secure connection.  By engaging in this virtual visit, you consent to the provision of healthcare and authorize for your insurance to be billed (if applicable) for the services provided during this visit. Depending on your insurance coverage, you may receive a charge related to this service.  I need to obtain your verbal consent now. Are you willing to proceed with your visit today? Emily Moon has provided verbal consent on 12/21/2024 for a virtual visit (video or telephone). Emily Moon, [place]  Date: 12/21/2024 12:22 PM   Virtual Visit via Video Note   I, Emily Moon, connected with  Emily Moon  ([id number], March 03, 1961) on 12/21/24 at 12:15 PM EST by a video-enabled telemedicine application and verified that I am speaking with the correct person using two identifiers.  Location: Patient: Virtual Visit  Location Patient: Home Provider: Virtual Visit Location Provider: Home Office   I discussed the limitations of evaluation and management by telemedicine and the availability of in person appointments. The patient expressed understanding and agreed to proceed.    History of Present Illness: Emily Moon is a 64 y.o. who identifies as a female who was assigned female at birth, and is being seen today for worsening URI symptoms now with new low-grade and intermittent fever and shortness of breath with increased congestion despite supportive measures given at time of hospital discharge 2.5 days ago. Was recently hospitalized with UTI and SIRS. Blood cultures negative. Notes these symptoms have remained resolved, and she has completed the last of her Keflex  she was given on discharge.    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   Obstructive sleep apnea syndrome 12/02/2018   Environmental and seasonal allergies 10/24/2018   Vitamin D  deficiency 02/15/2018   Cough 03/30/2017   Sepsis (HCC) 10/10/2016   B12 deficiency 05/22/2016   Chronic back pain greater than 3 months duration 10/31/2014   Bipolar disorder (HCC) 05/21/2014   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  Insomnia 09/01/2011   HLD (hyperlipidemia) 01/20/2011   Chronic pain syndrome 01/07/2011   Multiple sclerosis, progressive relapsing 08/28/2010   NEPHROLITHIASIS, HX OF 08/28/2010   Gastroesophageal reflux disease 08/28/2010   History of urinary stone 08/28/2010    Allergies: Allergies[1] Medications: Current  Medications[2]  Observations/Objective: Patient is well-developed, well-nourished in no acute distress.  Resting comfortably at home.  Head is normocephalic, atraumatic.  No labored breathing.  Speech is clear and coherent with logical content.  Patient is alert and oriented at baseline.   Assessment and Plan: 1. Acute URI (Primary)  2. SOB (shortness of breath)  Giving health history, and recent hospitalization needs further assessment including examination and potential imaging to rule out nosocomial pneumonia. She agrees to be evaluated ASAP today. Her sister who is present for exam, agrees to take her.  Follow Up Instructions: I discussed the assessment and treatment plan with the patient. The patient was provided an opportunity to ask questions and all were answered. The patient agreed with the plan and demonstrated an understanding of the instructions.  A copy of instructions were sent to the patient via MyChart unless otherwise noted below.    The patient was advised to call back or seek an in-person evaluation if the symptoms worsen or if the condition fails to improve as anticipated.    Emily Velma Lunger, PA-C    [1]  Allergies Allergen Reactions   Crestor  [Rosuvastatin ] Other (See Comments)    Joint pain, muscle pain, and hair loss   Halcion [Triazolam] Other (See Comments)    Dizziness,headaches,bladder problems   Levaquin  [Levofloxacin  In D5w] Diarrhea and Itching    Shoulder pain   Naproxen Sodium     Patient tolerates in small doses. Her reaction is swelling of the lower extremities.    Ozempic  (0.25 Or 0.5 Mg-Dose) [Semaglutide (0.25 Or 0.5mg -Dos)] Itching   Cefaclor Other (See Comments)    Doesn't remember---unsure if actually allergic  1/30: tolerated ceftriaxone    Latex Itching   Prednisone      Elevates blood sugars   Sulfa  Antibiotics Itching    Unsure of reaction possibly itching   Tramadol  Itching and Nausea And Vomiting   Zinc Other (See  Comments)    constipation  constipation    Aripiprazole Other (See Comments)    Muscle tension/cramping   Diclofenac  Sodium Rash    made very sick   Iodinated Contrast Media Itching, Other (See Comments), Palpitations and Rash  [2]  Current Outpatient Medications:    aspirin  EC 81 MG tablet, Take 81 mg by mouth daily. Swallow whole., Disp: , Rfl:    Azelastine  HCl 137 MCG/SPRAY SOLN, PLACE 2 SPRAYS INTO BOTH NOSTRILS 2 (TWO) TIMES DAILY (Patient not taking: Reported on 12/16/2024), Disp: 30 mL, Rfl: 11   B Complex-Biotin -FA (BIG 100, BIOTIN , PO), Take 300 mg by mouth at bedtime. (Patient not taking: Reported on 12/16/2024), Disp: , Rfl:    BD INTEGRA SYRINGE 25G X 1 3 ML MISC, SMARTSIG:1 Syringe(s) Once a Month, Disp: , Rfl:    benzonatate  (TESSALON ) 100 MG capsule, Take 1 capsule (100 mg total) by mouth 3 (three) times daily as needed for cough., Disp: 27 capsule, Rfl: 0   Biotin  5 MG TBDP, Take 200 mg by mouth., Disp: , Rfl:    bisacodyl  (DULCOLAX) 5 MG EC tablet, Take 5 mg by mouth daily as needed for moderate constipation., Disp: , Rfl:    cephALEXin  (KEFLEX ) 500 MG capsule, Take 1 capsule (500 mg total) by mouth 2 (two)  times daily for 3 days., Disp: 6 capsule, Rfl: 0   cetirizine (ZYRTEC) 10 MG tablet, Take 10 mg by mouth daily., Disp: , Rfl:    chlorpheniramine-HYDROcodone  (TUSSIONEX) 10-8 MG/5ML, Take 5 mLs by mouth at bedtime as needed for up to 5 days (cough at night)., Disp: 25 mL, Rfl: 0   Cholecalciferol  (VITAMIN D3) 250 MCG (10000 UT) capsule, Take 6 capsules by mouth at bedtime., Disp: , Rfl:    clindamycin  (CLEOCIN  T) 1 % external solution, Apply to scalp once or twice a day prn bumps (Patient not taking: Reported on 12/16/2024), Disp: 30 mL, Rfl: 2   clobetasol  cream (TEMOVATE ) 0.05 %, Apply 1 Application topically 2 (two) times daily. Apply to aa QD-BID PRN up to two weeks. Then up to 5 days per week as needed. Avoid applying to face, groin, and axilla. Use as directed.  Long-term use can cause thinning of the skin., Disp: 15 g, Rfl: 1   Continuous Glucose Receiver (FREESTYLE LIBRE 3 READER) DEVI, USE AS DIRECTED, Disp: 1 each, Rfl: 0   Continuous Glucose Receiver (FREESTYLE LIBRE 3 READER) DEVI, Use to check blood sugar continuous, Disp: 1 each, Rfl: 0   Continuous Glucose Sensor (FREESTYLE LIBRE 3 SENSOR) MISC, PLACE 1 SENSOR ON THE SKIN EVERY 14 DAYS. USE TO CHECK GLUCOSE CONTINUOUSLY, Disp: 6 each, Rfl: 3   desipramine  (NORPRAMIN ) 25 MG tablet, 2  qam, Disp: 180 tablet, Rfl: 2   diphenhydrAMINE  (BENADRYL ) 2 % cream, Apply topically 3 (three) times daily as needed for itching., Disp: , Rfl:    Dulaglutide  (TRULICITY ) 3 MG/0.5ML SOAJ, Inject 3 mg into the skin once a week., Disp: 6 mL, Rfl: 3   DULoxetine  (CYMBALTA ) 20 MG capsule, Take 2 capsules (40 mg total) by mouth daily., Disp: 60 capsule, Rfl: 5   Evolocumab  (REPATHA  SURECLICK) 140 MG/ML SOAJ, INJECT 140 MG INTO THE SKIN EVERY 14 (FOURTEEN) DAYS. CALL TO SCHEDULE APPT PRIOR TO NEXT REFILL, Disp: 6 mL, Rfl: 3   fenofibrate  (TRICOR ) 145 MG tablet, TAKE 1 TABLET BY MOUTH DAILY FOR CHOLESTEROL *NEED APPOINTMENT, Disp: 90 tablet, Rfl: 3   Fluticasone -Umeclidin-Vilant (TRELEGY ELLIPTA ) 200-62.5-25 MCG/ACT AEPB, Inhale 1 puff into the lungs daily., Disp: , Rfl:    guaiFENesin  (ROBITUSSIN) 100 MG/5ML liquid, Take 5 mLs by mouth every 6 (six) hours as needed for to loosen phlegm (and/or cough during the day not relieved with tessalon  perls)., Disp: 118 mL, Rfl: 0   hydrOXYzine  (VISTARIL ) 50 MG capsule, Take 2 capsules (100 mg total) by mouth at bedtime. TAKE 1 CAPSLE BY MOUTH AT NOON, 1 AT 6 PM, AND 1 AS NEEDED, Disp: 180 capsule, Rfl: 2   ibuprofen  (ADVIL ) 200 MG tablet, Take 800 mg by mouth 2 (two) times daily., Disp: , Rfl:    ipratropium-albuterol  (DUONEB) 0.5-2.5 (3) MG/3ML SOLN, Take 3 mLs by nebulization every 6 (six) hours as needed., Disp: 360 mL, Rfl: 0   ipratropium-albuterol  (DUONEB) 0.5-2.5 (3) MG/3ML  SOLN, Take 3 mLs by nebulization every 6 (six) hours as needed (wheezing, shortness of breath)., Disp: 360 mL, Rfl: 0   ketoconazole  (NIZORAL ) 2 % shampoo, Apply 1 Application topically as directed. Wash from waist up 3 times a week for 2 months, then decrease to 1 time a month, let sit 5 minutes and rinse off (Patient not taking: Reported on 12/16/2024), Disp: 120 mL, Rfl: 6   L-Lysine  HCl 500 MG TABS, Take 1,000 mg by mouth., Disp: , Rfl:    lamoTRIgine  (LAMICTAL ) 150 MG tablet,  Take 2 tablets (300 mg total) by mouth daily at bedtime., Disp: 180 tablet, Rfl: 5   levalbuterol  (XOPENEX  HFA) 45 MCG/ACT inhaler, Inhale 2 puffs into the lungs every 4 (four) hours as needed for wheezing or shortness of breath (or cough)., Disp: 15 g, Rfl: 12   lidocaine  (LMX) 4 % cream, Apply 1 Application topically daily as needed., Disp: , Rfl:    metFORMIN  (GLUCOPHAGE ) 500 MG tablet, TAKE 2 TABS BY MOUTH EVERY MORNING AND 1 TAB IN THE EVENING, Disp: 270 tablet, Rfl: 1   methocarbamol  (ROBAXIN ) 500 MG tablet, Take 1,500 mg by mouth at bedtime., Disp: , Rfl:    metoprolol  tartrate (LOPRESSOR ) 50 MG tablet, TAKE 1 TABLET BY MOUTH TWICE A DAY, Disp: 180 tablet, Rfl: 3   montelukast  (SINGULAIR ) 10 MG tablet, TAKE 1 TABLET BY MOUTH EVERYDAY AT BEDTIME, Disp: 90 tablet, Rfl: 0   mupirocin  ointment (BACTROBAN ) 2 %, Apply 1 Application topically daily. Qd to wound on finger (Patient not taking: Reported on 12/16/2024), Disp: 22 g, Rfl: 1   neomycin -bacitracin-polymyxin (NEOSPORIN) [phone] ointment, Apply 1 application topically 4 (four) times daily as needed (for cut/scrapes.)., Disp: , Rfl:    NEOMYCIN -POLYMYXIN-HYDROCORTISONE (CORTISPORIN) 1 % SOLN OTIC solution, Apply 1-2 drops to toe BID after soaking (Patient not taking: Reported on 12/16/2024), Disp: 10 mL, Rfl: 1   pantoprazole  (PROTONIX ) 40 MG tablet, Take 1 tablet (40 mg total) by mouth 2 (two) times daily before a meal. For heartburn, Disp: 180 tablet, Rfl: 3    polyvinyl alcohol  (LIQUIFILM TEARS) 1.4 % ophthalmic solution, Place 2 drops into both eyes at bedtime., Disp: , Rfl:    pregabalin  (LYRICA ) 75 MG capsule, Take 1 capsule (75 mg total) by mouth 2 (two) times daily. (Patient taking differently: Take 75 mg by mouth daily.), Disp: 60 capsule, Rfl: 3   Roflumilast  (ZORYVE ) 0.3 % CREA, Apply 1 application  topically daily. qd to aa psoriasis on legs, arms, Disp: 60 g, Rfl: 4   Ruxolitinib Phosphate  (OPZELURA ) 1.5 % CREA, Apply to aa's rash QD PRN. (Patient not taking: Reported on 12/16/2024), Disp: 60 g, Rfl: 3   STIOLTO RESPIMAT  2.5-2.5 MCG/ACT AERS, Inhale 2 puffs into the lungs at bedtime., Disp: , Rfl:    temazepam  (RESTORIL ) 30 MG capsule, Take 2 capsules (60 mg total) by mouth at bedtime as needed for sleep., Disp: 60 capsule, Rfl: 0   traZODone  (DESYREL ) 100 MG tablet, 3 qhs, Disp: 90 tablet, Rfl: 4   valACYclovir  (VALTREX ) 1000 MG tablet, Take 2 tablets by mouth twice daily for 1 day as needed for herpes outbreak., Disp: 12 tablet, Rfl: 0  "

## 2024-12-21 NOTE — ED Provider Notes (Signed)
 " CAY RALPH PELT    CSN: [id number] Arrival date & time: 12/21/24  1322      History   Chief Complaint No chief complaint on file.   HPI AARIKA MOON is a 64 y.o. female.   Patient presents for evaluation of chest congestion, nasal congestion with mucus described as yellow to green in color, a dry cough, rattle to the chest and fevers beginning 4 days ago.  Fever peaked yesterday evening at 101.  Over the past 3 days has begun to experience shortness of breath at rest, difficulty speaking and difficulty lying flat as well as wheezing.  Symptoms began during recent hospitalization for generalized weakness, COVID and flu testing were completed 2 days prior to symptoms beginning, was negative at that time.  Patient endorses possible sick contacts while in the emergency department.  History of COPD and asthma.  Has attempted use of Xopenex  inhaler and DuoNeb nebulizers with some improvement.  Decreased appetite at baseline has been using CBD to manage, has not worsened.  Was prescribed Tessalon  guaifenesin  and Tussionex at discharge, has been using with some effectiveness.  Additionally taking over-the-counter Mucinex  DM.          Past Medical History:  Diagnosis Date   Anxiety 11-16-89   Asthma 11-16-88   Bipolar disorder (HCC) 05/21/2014   Chronic pain syndrome 01/07/2011   COPD (chronic obstructive pulmonary disease) (HCC) 11-17-19   Diabetes mellitus without complication (HCC) 11-16-2020   Fibromyalgia syndrome    GERD (gastroesophageal reflux disease) 11-16-92   Gout    Hip dysplasia, congenital 09/15/2013   History of kidney stones    Hypercholesterolemia    Multiple sclerosis    Osteoporosis    Psoriasis    Seasonal allergies    Sleep apnea 2012   sleep study / slight, no interventions   Vitamin B 12 deficiency     Patient Active Problem List   Diagnosis Date Noted   Generalized weakness 12/15/2024   SVT (supraventricular tachycardia) 04/15/2023   Angioedema  12/03/2022   Prolonged QT interval 03/23/2022   Iron  deficiency anemia 12/25/2021   Myalgia due to statin 11/04/2021   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   Psoriasis 11/02/2019   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   Obstructive sleep apnea syndrome 12/02/2018   Environmental and seasonal allergies 10/24/2018   Vitamin D  deficiency 02/15/2018   Cough 03/30/2017   Sepsis (HCC) 10/10/2016   B12 deficiency 05/22/2016   Chronic back pain greater than 3 months duration 10/31/2014   Bipolar disorder (HCC) 05/21/2014   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   Insomnia 09/01/2011   HLD (hyperlipidemia) 01/20/2011   Chronic pain syndrome 01/07/2011   Multiple sclerosis, progressive relapsing 08/28/2010   NEPHROLITHIASIS, HX OF 08/28/2010   Gastroesophageal reflux disease 08/28/2010   History of urinary stone 08/28/2010    Past Surgical History:  Procedure Laterality Date   CATARACT EXTRACTION W/PHACO Left 05/21/2015   Procedure: CATARACT EXTRACTION PHACO AND INTRAOCULAR LENS PLACEMENT (IOC);  Surgeon: Elsie Carmine, MD;  Location: [facility];  Service: Ophthalmology;  Laterality: Left;  US  00:35 AP% 22.9 CDE 8.11 fluid pack lot #[id number]   CATARACT EXTRACTION W/PHACO Right 06/04/2015   Procedure: CATARACT EXTRACTION PHACO AND INTRAOCULAR LENS PLACEMENT (IOC);  Surgeon: Elsie Carmine, MD;  Location: [facility];  Service: Ophthalmology;  Laterality: Right;  US :00:48 AP%: 10.5 CDE:5.08 Fluid lot #[id number]   CYSTOSCOPY/URETEROSCOPY/HOLMIUM LASER/STENT PLACEMENT Bilateral 09/22/2016   Procedure: CYSTOSCOPY/URETEROSCOPY/HOLMIUM LASER/STENT PLACEMENT;  Surgeon: Rosina  Penne, MD;  Location: [facility];  Service: Urology;  Laterality: Bilateral;   EYE SURGERY  2015 and 2016   tissue biopsy   FOOT SURGERY  11/16/2013   JOINT REPLACEMENT Left 2013 and 2022   hip replacement   LITHOTRIPSY     PTOSIS REPAIR Bilateral 02/18/2016   Procedure: BILATERAL  PTOSIS REPAIR UPPER EYELIDS;  Surgeon: Greig CHRISTELLA Gay, MD;  Location: [facility];  Service: Ophthalmology;  Laterality: Bilateral;  LEAVE PT EARLY AM   thumb surgery Right    TONSILLECTOMY  11/17/1971   TOTAL HIP ARTHROPLASTY Right 07/02/2021   Procedure: TOTAL HIP ARTHROPLASTY ANTERIOR APPROACH;  Surgeon: Melodi Lerner, MD;  Location: [facility];  Service: Orthopedics;  Laterality: Right;    OB History     Gravida  1   Para  1   Term      Preterm      AB      Living         SAB      IAB      Ectopic      Multiple      Live Births               Home Medications    Prior to Admission medications  Medication Sig Start Date End Date Taking? Authorizing Provider  aspirin  EC 81 MG tablet Take 81 mg by mouth daily. Swallow whole.    [provider]  Azelastine  HCl 137 MCG/SPRAY SOLN PLACE 2 SPRAYS INTO BOTH NOSTRILS 2 (TWO) TIMES DAILY Patient not taking: Reported on 12/16/2024 04/19/23   Letvak, Richard I, MD  B Complex-Biotin -FA (BIG 100, BIOTIN , PO) Take 300 mg by mouth at bedtime. Patient not taking: Reported on 12/16/2024    [provider]  BD INTEGRA SYRINGE 25G X 1 3 ML MISC SMARTSIG:1 Syringe(s) Once a Month 09/06/24   [provider]  benzonatate  (TESSALON ) 100 MG capsule Take 1 capsule (100 mg total) by mouth 3 (three) times daily as needed for cough. 12/18/24   Cox, Amy N, DO  Biotin  5 MG TBDP Take 200 mg by mouth.    [provider]  bisacodyl  (DULCOLAX) 5 MG EC tablet Take 5 mg by mouth daily as needed for moderate constipation.    [provider]  cephALEXin  (KEFLEX ) 500 MG capsule Take 1 capsule (500 mg total) by mouth 2 (two) times daily for 3 days. 12/18/24 12/21/24  Cox, Amy N, DO  cetirizine (ZYRTEC) 10 MG tablet Take 10 mg by mouth daily.    [provider]  chlorpheniramine-HYDROcodone  (TUSSIONEX) 10-8 MG/5ML Take 5 mLs by mouth at bedtime as needed for up to 5 days (cough at night). 12/18/24 12/23/24   Cox, Amy N, DO  Cholecalciferol  (VITAMIN D3) 250 MCG (10000 UT) capsule Take 6 capsules by mouth at bedtime.    [provider]  clindamycin  (CLEOCIN  T) 1 % external solution Apply to scalp once or twice a day prn bumps Patient not taking: Reported on 12/16/2024 11/18/21   Moye, Virginia , MD  clobetasol  cream (TEMOVATE ) 0.05 % Apply 1 Application topically 2 (two) times daily. Apply to aa QD-BID PRN up to two weeks. Then up to 5 days per week as needed. Avoid applying to face, groin, and axilla. Use as directed. Long-term use can cause thinning of the skin. 09/06/24   Hester Alm BROCKS, MD  Continuous Glucose Receiver (FREESTYLE LIBRE 3 READER) DEVI USE AS DIRECTED 10/20/24   Watt Mirza, MD  Continuous  Glucose Receiver (FREESTYLE LIBRE 3 READER) DEVI Use to check blood sugar continuous 10/20/24   Copland, Spencer, MD  Continuous Glucose Sensor (FREESTYLE LIBRE 3 SENSOR) MISC PLACE 1 SENSOR ON THE SKIN EVERY 14 DAYS. USE TO CHECK GLUCOSE CONTINUOUSLY 09/28/24   Copland, Jacques, MD  desipramine  (NORPRAMIN ) 25 MG tablet 2  qam 11/22/24   Plovsky, Elna, MD  diphenhydrAMINE  (BENADRYL ) 2 % cream Apply topically 3 (three) times daily as needed for itching.    [provider]  Dulaglutide  (TRULICITY ) 3 MG/0.5ML SOAJ Inject 3 mg into the skin once a week. 11/07/24   Copland, Jacques, MD  DULoxetine  (CYMBALTA ) 20 MG capsule Take 2 capsules (40 mg total) by mouth daily. 11/22/24   Plovsky, Elna, MD  Evolocumab  (REPATHA  SURECLICK) 140 MG/ML SOAJ INJECT 140 MG INTO THE SKIN EVERY 14 (FOURTEEN) DAYS. CALL TO SCHEDULE APPT PRIOR TO NEXT REFILL 09/19/24   Darliss Rogue, MD  fenofibrate  (TRICOR ) 145 MG tablet TAKE 1 TABLET BY MOUTH DAILY FOR CHOLESTEROL *NEED APPOINTMENT 09/05/24   Abigail Bernardino HERO, PA-C  Fluticasone -Umeclidin-Vilant (TRELEGY ELLIPTA ) 200-62.5-25 MCG/ACT AEPB Inhale 1 puff into the lungs daily. 09/28/24   Kasa, Kurian, MD  guaiFENesin  (ROBITUSSIN) 100 MG/5ML liquid Take 5 mLs by  mouth every 6 (six) hours as needed for to loosen phlegm (and/or cough during the day not relieved with tessalon  perls). 12/18/24   Cox, Amy N, DO  hydrOXYzine  (VISTARIL ) 50 MG capsule Take 2 capsules (100 mg total) by mouth at bedtime. TAKE 1 CAPSLE BY MOUTH AT NOON, 1 AT 6 PM, AND 1 AS NEEDED 08/02/24   Plovsky, Elna, MD  ibuprofen  (ADVIL ) 200 MG tablet Take 800 mg by mouth 2 (two) times daily.    [provider]  ipratropium-albuterol  (DUONEB) 0.5-2.5 (3) MG/3ML SOLN Take 3 mLs by nebulization every 6 (six) hours as needed. 12/18/24   Cox, Amy N, DO  ipratropium-albuterol  (DUONEB) 0.5-2.5 (3) MG/3ML SOLN Take 3 mLs by nebulization every 6 (six) hours as needed (wheezing, shortness of breath). 12/18/24   Cox, Amy N, DO  ketoconazole  (NIZORAL ) 2 % shampoo Apply 1 Application topically as directed. Wash from waist up 3 times a week for 2 months, then decrease to 1 time a month, let sit 5 minutes and rinse off Patient not taking: Reported on 12/16/2024 08/27/22   Hester Alm BROCKS, MD  L-Lysine  HCl 500 MG TABS Take 1,000 mg by mouth.    [provider]  lamoTRIgine  (LAMICTAL ) 150 MG tablet Take 2 tablets (300 mg total) by mouth daily at bedtime. 08/02/24   Plovsky, Elna, MD  levalbuterol  (XOPENEX  HFA) 45 MCG/ACT inhaler Inhale 2 puffs into the lungs every 4 (four) hours as needed for wheezing or shortness of breath (or cough). 09/28/24   Kasa, Kurian, MD  lidocaine  (LMX) 4 % cream Apply 1 Application topically daily as needed.    [provider]  metFORMIN  (GLUCOPHAGE ) 500 MG tablet TAKE 2 TABS BY MOUTH EVERY MORNING AND 1 TAB IN THE EVENING 07/03/24   Jimmy Ade I, MD  methocarbamol  (ROBAXIN ) 500 MG tablet Take 1,500 mg by mouth at bedtime.    [provider]  metoprolol  tartrate (LOPRESSOR ) 50 MG tablet TAKE 1 TABLET BY MOUTH TWICE A DAY 03/02/24   Dunn, Bernardino HERO, PA-C  montelukast  (SINGULAIR ) 10 MG tablet TAKE 1 TABLET BY MOUTH EVERYDAY AT BEDTIME 09/06/24   Hester Alm BROCKS, MD  mupirocin  ointment (BACTROBAN ) 2 % Apply 1 Application topically daily. Qd to wound on finger Patient  not taking: Reported on 12/16/2024 09/06/24   Hester Alm BROCKS, MD  neomycin -bacitracin-polymyxin (NEOSPORIN) [phone] ointment Apply 1 application topically 4 (four) times daily as needed (for cut/scrapes.).    [provider]  NEOMYCIN -POLYMYXIN-HYDROCORTISONE (CORTISPORIN) 1 % SOLN OTIC solution Apply 1-2 drops to toe BID after soaking Patient not taking: Reported on 12/16/2024 04/05/23   Hyatt, Max T, DPM  pantoprazole  (PROTONIX ) 40 MG tablet Take 1 tablet (40 mg total) by mouth 2 (two) times daily before a meal. For heartburn 05/24/24   Jimmy Charlie FERNS, MD  polyvinyl alcohol  (LIQUIFILM TEARS) 1.4 % ophthalmic solution Place 2 drops into both eyes at bedtime.    [provider]  pregabalin  (LYRICA ) 75 MG capsule Take 1 capsule (75 mg total) by mouth 2 (two) times daily. Patient taking differently: Take 75 mg by mouth daily. 11/13/24   Copland, Jacques, MD  Roflumilast  (ZORYVE ) 0.3 % CREA Apply 1 application  topically daily. qd to aa psoriasis on legs, arms 07/14/23   Hester Alm BROCKS, MD  Ruxolitinib Phosphate  (OPZELURA ) 1.5 % CREA Apply to aa's rash QD PRN. Patient not taking: Reported on 12/16/2024 01/11/23   Hester Alm BROCKS, MD  STIOLTO RESPIMAT  2.5-2.5 MCG/ACT AERS Inhale 2 puffs into the lungs at bedtime. 03/02/22   [provider]  temazepam  (RESTORIL ) 30 MG capsule Take 2 capsules (60 mg total) by mouth at bedtime as needed for sleep. 11/22/24   Plovsky, Elna, MD  traZODone  (DESYREL ) 100 MG tablet 3 qhs 11/22/24   Plovsky, Elna, MD  valACYclovir  (VALTREX ) 1000 MG tablet Take 2 tablets by mouth twice daily for 1 day as needed for herpes outbreak. 12/23/22   Gretta Comer POUR, NP    Family History Family History  Problem Relation Age of Onset   Cancer Mother    Heart disease Mother    Diabetes Mother    Sleep apnea Mother    Hyperlipidemia  Mother    Cancer Father        Abdomen with mastasis   High Cholesterol Father    Early death Father    Heart disease Maternal Grandfather    Diabetes Maternal Grandmother    Cancer Paternal Grandmother    Early death Paternal Grandmother    Arthritis Sister    COPD Sister    Early death Sister    Heart disease Maternal Uncle    Kidney disease Neg Hx    Bladder Cancer Neg Hx    Prostate cancer Neg Hx    Kidney cancer Neg Hx     Social History Social History[1]   Allergies   Crestor  [rosuvastatin ], Halcion [triazolam], Levaquin  [levofloxacin  in d5w], Naproxen sodium, Ozempic  (0.25 or 0.5 mg-dose) [semaglutide (0.25 or 0.5mg -dos)], Cefaclor, Latex, Prednisone , Sulfa  antibiotics, Tramadol , Zinc, Aripiprazole, Diclofenac  sodium, and Iodinated contrast media   Review of Systems Review of Systems   Physical Exam Triage Vital Signs ED Triage Vitals  Encounter Vitals Group     BP      Girls Systolic BP Percentile      Girls Diastolic BP Percentile      Boys Systolic BP Percentile      Boys Diastolic BP Percentile      Pulse      Resp      Temp      Temp src      SpO2      Weight      Height      Head Circumference      Peak Flow  Pain Score      Pain Loc      Pain Education      Exclude from Growth Chart    No data found.  Updated Vital Signs LMP 08/23/2014   Visual Acuity Right Eye Distance:   Left Eye Distance:   Bilateral Distance:    Right Eye Near:   Left Eye Near:    Bilateral Near:     Physical Exam Constitutional:      Appearance: Normal appearance.  HENT:     Head: Normocephalic.     Right Ear: Tympanic membrane, ear canal and external ear normal.     Left Ear: Tympanic membrane, ear canal and external ear normal.     Nose: Congestion present.     Mouth/Throat:     Pharynx: No oropharyngeal exudate or posterior oropharyngeal erythema.  Eyes:     Extraocular Movements: Extraocular movements intact.  Cardiovascular:     Rate and  Rhythm: Normal rate and regular rhythm.     Pulses: Normal pulses.     Heart sounds: Normal heart sounds.  Pulmonary:     Breath sounds: Normal breath sounds.     Comments: Labored breathing with exertion Musculoskeletal:     Cervical back: Normal range of motion and neck supple.  Neurological:     Mental Status: She is alert and oriented to person, place, and time. Mental status is at baseline.      UC Treatments / Results  Labs (all labs ordered are listed, but only abnormal results are displayed) Labs Reviewed - No data to display  EKG   Radiology No results found.  Procedures Procedures (including critical care time)  Medications Ordered in UC Medications - No data to display  Initial Impression / Assessment and Plan / UC Course  I have reviewed the triage vital signs and the nursing notes.  Pertinent labs & imaging results that were available during my care of the patient were reviewed by me and considered in my medical decision making (see chart for details).  Influenza A, COPD exacerbation, shortness of breath  Vital signs are stable, O2 saturation 93% on room air, patient becoming short of breath with exertion, improves at rest, currently in no signs of distress, retesting for viral illness that she was tested prior to symptoms beginning, positive for influenza A, this is day 5 of illness therefore deferring Tamiflu as we are past window for use, discussed this, chest x-ray is pending, do believe viruses exacerbating COPD, prescribed azithromycin , patient hesitant to use oral steroids as it increases blood sugar, discussed that this is a known side effect last A1c well-controlled at 6.0, has decided to defer use, given methylprednisolone  IM injection and advised continuation of inhaler and nebulizer for home advised continuation of cough medicine prescribed at discharge and advised to monitor closely, given strict precautions that if shortness of breath continues and/or  worsens that she is to be reevaluated in the emergency department, verbalized understanding Final Clinical Impressions(s) / UC Diagnoses   Final diagnoses:  None   Discharge Instructions   None    ED Prescriptions   None    PDMP not reviewed this encounter.     [1]  Social History Tobacco Use   Smoking status: Former    Current packs/day: 0.00    Average packs/day: 1 pack/day for 25.0 years (25.0 ttl pk-yrs)    Types: Cigarettes    Start date: 48    Quit date: 2019    Years since  quitting: 7.1    Passive exposure: Past   Smokeless tobacco: Never   Tobacco comments:    occasional use  Vaping Use   Vaping status: Former  Substance Use Topics   Alcohol  use: No    Alcohol /week: 0.0 standard drinks of alcohol    Drug use: Yes    Types: Methylphenidate , Marijuana    Comment: Delta 8 - once per day     Teresa Shelba SAUNDERS, NP 12/21/24 1454  "

## 2024-12-21 NOTE — Telephone Encounter (Signed)
" °  FYI Only or Action Required?: FYI only for provider: UC advised.  Patient was last seen in primary care on 12/21/2024 by Emily Elsie BROCKS, PA-C.  Called Nurse Triage reporting Cough.  Symptoms began several days ago.  Interventions attempted: Rest, hydration, or home remedies.  Symptoms are: gradually worsening.  Triage Disposition: See HCP Within 4 Hours (Or PCP Triage)  Patient/caregiver understands and will follow disposition?: Yes   Message from Emily Moon sent at 12/21/2024 12:36 PM EST  Summary: chest tightness,difficulty breathing   Reason for Triage: chest tightness,difficulty breathing,COPD         Reason for Disposition  [1] Longstanding difficulty breathing (e.g., CHF, COPD, emphysema) AND [2] WORSE than normal  Answer Assessment - Initial Assessment Questions Patient had virtual visit today- in person evaluation was recommended- No open appointment available in office within disposition- UC advised - patient will go.    1. RESPIRATORY STATUS: Describe your breathing? (e.g., wheezing, shortness of breath, unable to speak, severe coughing)      SOB-mild/moderate 2. ONSET: When did this breathing problem begin?      Released from hospital on Monday-has had symptoms in hospital and since released 3. PATTERN Does the difficult breathing come and go, or has it been constant since it started?      Comes and goes 4. SEVERITY: How bad is your breathing? (e.g., mild, moderate, severe)      moderate 5. RECURRENT SYMPTOM: Have you had difficulty breathing before? If Yes, ask: When was the last time? and What happened that time?      Yes - hx COPD/asthma 6. CARDIAC HISTORY: Do you have any history of heart disease? (e.g., heart attack, angina, bypass surgery, angioplasty)      SVT 7. LUNG HISTORY: Do you have any history of lung disease?  (e.g., pulmonary embolus, asthma, emphysema)     COPD 8. CAUSE: What do you think is causing the breathing problem?       COPD- cough 9. OTHER SYMPTOMS: Do you have any other symptoms? (e.g., chest pain, cough, dizziness, fever, runny nose)     Cough, chest tightness 10. O2 SATURATION MONITOR:  Do you use an oxygen  saturation monitor (pulse oximeter) at home? If Yes, ask: What is your reading (oxygen  level) today? What is your usual oxygen  saturation reading? (e.g., 95%)       91% presently  Protocols used: Breathing Difficulty-A-AH  "

## 2024-12-22 ENCOUNTER — Encounter: Payer: Self-pay | Admitting: Oncology

## 2024-12-22 ENCOUNTER — Telehealth: Payer: Self-pay

## 2024-12-22 NOTE — Transitions of Care (Post Inpatient/ED Visit) (Signed)
" ° °  12/22/2024  Name: Emily Moon MRN: [id number] DOB: 08-Dec-1960  Today's TOC FU Call Status: Today's TOC FU Call Status:: Unsuccessful Call (2nd Attempt) Unsuccessful Call (2nd Attempt) Date: 12/22/24  Attempted to reach the patient regarding the most recent Inpatient/ED visit.  Follow Up Plan: Additional outreach attempts will be made to reach the patient to complete the Transitions of Care (Post Inpatient/ED visit) call.   Arvin Seip RN, BSN, CCM [employer], Population Health Case Manager Phone: [phone]  "

## 2024-12-22 NOTE — Telephone Encounter (Signed)
 This encounter was created in error - please disregard.

## 2024-12-25 ENCOUNTER — Ambulatory Visit

## 2024-12-25 ENCOUNTER — Ambulatory Visit: Admission: RE | Admit: 2024-12-25 | Source: Ambulatory Visit

## 2024-12-25 ENCOUNTER — Inpatient Hospital Stay: Admitting: Family Medicine

## 2025-01-04 ENCOUNTER — Ambulatory Visit (HOSPITAL_COMMUNITY): Admitting: Clinical

## 2025-01-23 ENCOUNTER — Ambulatory Visit (HOSPITAL_COMMUNITY): Admitting: Clinical

## 2025-02-21 ENCOUNTER — Ambulatory Visit (HOSPITAL_COMMUNITY): Admitting: Psychiatry

## 2025-02-28 ENCOUNTER — Ambulatory Visit

## 2025-03-05 ENCOUNTER — Ambulatory Visit: Admitting: Podiatry

## 2025-03-07 ENCOUNTER — Ambulatory Visit: Admitting: Dermatology
